# Patient Record
Sex: Male | Born: 1950 | Race: Black or African American | Hispanic: No | Marital: Single | Smoking: Former smoker
Health system: Southern US, Community
[De-identification: ages and names within clinical notes are randomized; demographics above are authoritative.]

## PROBLEM LIST (undated history)

## (undated) DIAGNOSIS — F329 Major depressive disorder, single episode, unspecified: Secondary | ICD-10-CM

## (undated) DIAGNOSIS — I2510 Atherosclerotic heart disease of native coronary artery without angina pectoris: Secondary | ICD-10-CM

## (undated) DIAGNOSIS — I219 Acute myocardial infarction, unspecified: Secondary | ICD-10-CM

## (undated) DIAGNOSIS — M1990 Unspecified osteoarthritis, unspecified site: Secondary | ICD-10-CM

## (undated) DIAGNOSIS — R251 Tremor, unspecified: Secondary | ICD-10-CM

## (undated) DIAGNOSIS — D649 Anemia, unspecified: Secondary | ICD-10-CM

## (undated) DIAGNOSIS — I10 Essential (primary) hypertension: Secondary | ICD-10-CM

## (undated) DIAGNOSIS — I69354 Hemiplegia and 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 side: Secondary | ICD-10-CM

## (undated) DIAGNOSIS — G709 Myoneural disorder, unspecified: Secondary | ICD-10-CM

## (undated) DIAGNOSIS — I200 Unstable angina: Secondary | ICD-10-CM

## (undated) DIAGNOSIS — I639 Cerebral infarction, unspecified: Secondary | ICD-10-CM

## (undated) DIAGNOSIS — C801 Malignant (primary) neoplasm, unspecified: Secondary | ICD-10-CM

## (undated) DIAGNOSIS — K5981 Ogilvie syndrome: Secondary | ICD-10-CM

## (undated) DIAGNOSIS — F32A Depression, unspecified: Secondary | ICD-10-CM

## (undated) DIAGNOSIS — R569 Unspecified convulsions: Secondary | ICD-10-CM

## (undated) DIAGNOSIS — J449 Chronic obstructive pulmonary disease, unspecified: Secondary | ICD-10-CM

## (undated) DIAGNOSIS — M109 Gout, unspecified: Secondary | ICD-10-CM

## (undated) DIAGNOSIS — F1010 Alcohol abuse, uncomplicated: Secondary | ICD-10-CM

## (undated) DIAGNOSIS — F419 Anxiety disorder, unspecified: Secondary | ICD-10-CM

## (undated) HISTORY — PX: COLON SURGERY: SHX602

## (undated) HISTORY — PX: JOINT REPLACEMENT: SHX530

---

## 2003-08-02 ENCOUNTER — Other Ambulatory Visit: Payer: Self-pay

## 2003-08-03 ENCOUNTER — Other Ambulatory Visit: Payer: Self-pay

## 2006-03-08 ENCOUNTER — Other Ambulatory Visit: Payer: Self-pay

## 2006-03-09 ENCOUNTER — Inpatient Hospital Stay: Payer: Self-pay

## 2008-09-27 ENCOUNTER — Inpatient Hospital Stay: Payer: Self-pay | Admitting: Internal Medicine

## 2008-10-12 ENCOUNTER — Emergency Department: Payer: Self-pay | Admitting: Emergency Medicine

## 2008-10-29 ENCOUNTER — Inpatient Hospital Stay: Payer: Self-pay | Admitting: Internal Medicine

## 2010-05-05 ENCOUNTER — Ambulatory Visit: Payer: Self-pay | Admitting: Internal Medicine

## 2010-05-14 ENCOUNTER — Inpatient Hospital Stay: Payer: Self-pay | Admitting: Internal Medicine

## 2010-06-04 ENCOUNTER — Ambulatory Visit: Payer: Self-pay | Admitting: Internal Medicine

## 2010-06-11 ENCOUNTER — Ambulatory Visit: Payer: Self-pay

## 2010-06-12 ENCOUNTER — Inpatient Hospital Stay (HOSPITAL_COMMUNITY): Admission: EM | Admit: 2010-06-12 | Discharge: 2010-06-13 | Payer: Self-pay | Admitting: Emergency Medicine

## 2010-06-22 ENCOUNTER — Ambulatory Visit: Payer: Self-pay | Admitting: Internal Medicine

## 2010-07-05 ENCOUNTER — Ambulatory Visit: Payer: Self-pay | Admitting: Internal Medicine

## 2010-07-25 ENCOUNTER — Ambulatory Visit (HOSPITAL_COMMUNITY)
Admission: RE | Admit: 2010-07-25 | Discharge: 2010-07-25 | Payer: Self-pay | Source: Home / Self Care | Admitting: Internal Medicine

## 2010-09-07 LAB — CBC
HCT: 39.2 % (ref 39.0–52.0)
Hemoglobin: 12.3 g/dL — ABNORMAL LOW (ref 13.0–17.0)
MCH: 28.7 pg (ref 26.0–34.0)
MCHC: 31.4 g/dL (ref 30.0–36.0)
MCV: 91.4 fL (ref 78.0–100.0)
Platelets: 248 10*3/uL (ref 150–400)
RBC: 4.29 MIL/uL (ref 4.22–5.81)
RDW: 14.3 % (ref 11.5–15.5)
WBC: 6.5 10*3/uL (ref 4.0–10.5)

## 2010-09-07 LAB — COMPREHENSIVE METABOLIC PANEL
ALT: 20 U/L (ref 0–53)
AST: 26 U/L (ref 0–37)
Albumin: 3.8 g/dL (ref 3.5–5.2)
Alkaline Phosphatase: 94 U/L (ref 39–117)
BUN: 19 mg/dL (ref 6–23)
CO2: 26 mEq/L (ref 19–32)
Calcium: 9.9 mg/dL (ref 8.4–10.5)
Chloride: 103 mEq/L (ref 96–112)
Creatinine, Ser: 0.84 mg/dL (ref 0.4–1.5)
GFR calc Af Amer: >60 mL/min (ref >60)
GFR calc non Af Amer: >60 mL/min (ref >60)
Glucose, Bld: 84 mg/dL (ref 70–99)
Potassium: 4.8 mEq/L (ref 3.5–5.1)
Sodium: 139 mEq/L (ref 135–145)
Total Bilirubin: 0.4 mg/dL (ref 0.3–1.2)
Total Protein: 8.2 g/dL (ref 6.0–8.3)

## 2010-09-07 LAB — APTT: aPTT: 33 seconds (ref 24–37)

## 2010-09-07 LAB — PROTIME-INR
INR: 1.03 (ref 0.00–1.49)
Prothrombin Time: 13.7 seconds (ref 11.6–15.2)

## 2010-09-08 LAB — DIFFERENTIAL
Basophils Absolute: 0 10*3/uL (ref 0.0–0.1)
Basophils Relative: 0 % (ref 0–1)
Eosinophils Absolute: 0.2 10*3/uL (ref 0.0–0.7)
Eosinophils Relative: 3 % (ref 0–5)
Lymphocytes Relative: 38 % (ref 12–46)
Lymphs Abs: 2.4 10*3/uL (ref 0.7–4.0)
Monocytes Absolute: 0.5 10*3/uL (ref 0.1–1.0)
Monocytes Relative: 8 % (ref 3–12)
Neutro Abs: 3.3 10*3/uL (ref 1.7–7.7)
Neutrophils Relative %: 52 % (ref 43–77)

## 2010-09-12 ENCOUNTER — Inpatient Hospital Stay (HOSPITAL_COMMUNITY)
Admission: RE | Admit: 2010-09-12 | Discharge: 2010-09-21 | Payer: Self-pay | Source: Home / Self Care | Attending: General Surgery | Admitting: General Surgery

## 2010-09-12 ENCOUNTER — Encounter (INDEPENDENT_AMBULATORY_CARE_PROVIDER_SITE_OTHER): Payer: Self-pay | Admitting: General Surgery

## 2010-09-12 HISTORY — PX: LAPAROSCOPIC SIGMOID COLECTOMY: SHX5928

## 2010-09-19 LAB — CBC
HCT: 33.2 % — ABNORMAL LOW (ref 39.0–52.0)
HCT: 33.2 % — ABNORMAL LOW (ref 39.0–52.0)
Hemoglobin: 10.5 g/dL — ABNORMAL LOW (ref 13.0–17.0)
Hemoglobin: 10.8 g/dL — ABNORMAL LOW (ref 13.0–17.0)
MCH: 28.9 pg (ref 26.0–34.0)
MCH: 28.9 pg (ref 26.0–34.0)
MCHC: 31.6 g/dL (ref 30.0–36.0)
MCHC: 32.5 g/dL (ref 30.0–36.0)
MCV: 91.5 fL (ref 78.0–100.0)
MCV: 88.8 fL (ref 78.0–100.0)
Platelets: 234 10*3/uL (ref 150–400)
Platelets: 332 10*3/uL (ref 150–400)
RBC: 3.63 MIL/uL — ABNORMAL LOW (ref 4.22–5.81)
RBC: 3.74 MIL/uL — ABNORMAL LOW (ref 4.22–5.81)
RDW: 13.9 % (ref 11.5–15.5)
RDW: 13.4 % (ref 11.5–15.5)
WBC: 9.3 10*3/uL (ref 4.0–10.5)
WBC: 7.9 10*3/uL (ref 4.0–10.5)

## 2010-09-19 LAB — BASIC METABOLIC PANEL
BUN: 4 mg/dL — ABNORMAL LOW (ref 6–23)
CO2: 27 mEq/L (ref 19–32)
Calcium: 8.9 mg/dL (ref 8.4–10.5)
Chloride: 105 mEq/L (ref 96–112)
Creatinine, Ser: 0.92 mg/dL (ref 0.4–1.5)
GFR calc Af Amer: >60 mL/min (ref >60)
GFR calc non Af Amer: >60 mL/min (ref >60)
Glucose, Bld: 118 mg/dL — ABNORMAL HIGH (ref 70–99)
Potassium: 4.2 mEq/L (ref 3.5–5.1)
Sodium: 140 mEq/L (ref 135–145)

## 2010-09-21 LAB — CBC
HCT: 31.4 % — ABNORMAL LOW (ref 39.0–52.0)
Hemoglobin: 10 g/dL — ABNORMAL LOW (ref 13.0–17.0)
MCH: 28.5 pg (ref 26.0–34.0)
MCHC: 31.8 g/dL (ref 30.0–36.0)
MCV: 89.5 fL (ref 78.0–100.0)
Platelets: 301 10*3/uL (ref 150–400)
RBC: 3.51 MIL/uL — ABNORMAL LOW (ref 4.22–5.81)
RDW: 13.5 % (ref 11.5–15.5)
WBC: 5.5 10*3/uL (ref 4.0–10.5)

## 2010-09-21 LAB — BASIC METABOLIC PANEL
BUN: 1 mg/dL — ABNORMAL LOW (ref 6–23)
CO2: 24 mEq/L (ref 19–32)
Calcium: 8.6 mg/dL (ref 8.4–10.5)
Chloride: 111 mEq/L (ref 96–112)
Creatinine, Ser: 0.8 mg/dL (ref 0.4–1.5)
GFR calc Af Amer: >60 mL/min (ref >60)
GFR calc non Af Amer: >60 mL/min (ref >60)
Glucose, Bld: 98 mg/dL (ref 70–99)
Potassium: 4.3 mEq/L (ref 3.5–5.1)
Sodium: 140 mEq/L (ref 135–145)

## 2010-09-23 NOTE — Discharge Summary (Signed)
  NAME:  Calvin [name], Calvin [name] NO.:  [id number]  MEDICAL RECORD NO.:  [id number]          PATIENT TYPE:  INP  LOCATION:  1533                         FACILITY:  [facility]  PHYSICIAN:  Lennie Muckle, MD      DATE OF BIRTH:  10-15-1950  DATE OF ADMISSION:  09/12/2010 DATE OF DISCHARGE:  09/17/2010                              DISCHARGE SUMMARY   FINAL DIAGNOSES: 1. Adenocarcinoma of the sigmoid. 2. Deconditioning.  PROCEDURE:  Laparoscopic hand-assisted sigmoid resection.  POSTOPERATIVE COURSE:  [name] is a 60 year old male who underwent laparoscopic hand-assisted sigmoid colectomy.  This was done on 09/12/2010.  He had an uneventful postoperative course and had mild complaints of abdominal discomfort around the incision site.  He was able to have a normal return of bowel function.  Serum chemistries and CBC were normal postoperatively.  He had been ambulating but has been deconditioned, was actually at [facility] preoperatively.  He does not really have much motivation to ambulate.  He is still recovering from his previous hospitalization from Clostridium difficile as well as deconditioning.  His wound has been without evidence of infection.  He has had no drainage.  He has had pain adequately controlled with oral narcotics.  He has been advanced to a regular diet.  I expect him not to have much of an appetite but have instructed him to take Boost supplement protein shakes for nutritional needs.  His final condition upon discharge is improved.  He will follow up with me in approximately 2 or 3 weeks.  Call if he develops any fevers, chills or increase in abdominal pain.     Lennie Muckle, MD     ALA/MEDQ  D:  09/16/2010  T:  09/16/2010  Job:  [id number]  Electronically Signed by Bertram Savin MD on 09/23/2010 02:51:29 PM

## 2010-09-23 NOTE — Discharge Summary (Signed)
  NAME:  Calvin [name], CAPRI NO.:  [id number]  MEDICAL RECORD NO.:  [id number]          PATIENT TYPE:  INP  LOCATION:  1533                         FACILITY:  [facility]  PHYSICIAN:  Lennie Muckle, MD      DATE OF BIRTH:  03/07/1951  DATE OF ADMISSION:  09/12/2010 DATE OF DISCHARGE:  09/21/2010                              DISCHARGE SUMMARY   This is an addendum to the previous dictation performed on January 13.  FINAL DIAGNOSIS:  Adenocarcinoma of the sigmoid.  PROCEDURES:  January 9, hand-assisted laparoscopic sigmoid resection. Final pathology was consistent with a adenocarcinoma, 7-cm tumor, negative lymph nodes.  HOSPITAL COURSE:  Mr. Starliper had had an episode with an ileus which prolonged his hospital stay by couple of days.  He began to have increased abdominal distention, nausea.  No emesis.  Radiographic imaging revealed dilated small-bowel with conservative management.  He was able to resolve this and is ambulating without difficulty, has been tolerating a regular diet, is having normal bowel movements.  He continued to have some slight abdominal distention, but is doing remarkably well.  His incision continues to be without evidence of infection.  He will be discharged to [facility] and follow up with me in approximately 2 to 3 weeks.  No heavy lifting over 20 pounds.  FINAL DISCHARGE DIAGNOSIS:  Adenocarcinoma T3, M0.  FINAL DISCHARGE MEDICATIONS:  Include; 1. Vicodin for pain. 2. Stool softener as needed. 3. Continue with Boost 4 times a day, Resource. 4. Ambien p.r.n. 5. Diltiazem 120 mg. 6. Debrox right ear as needed. 7. Iron tablets once daily.  CONDITION UPON DISCHARGE:  Improved.     Lennie Muckle, MD     Calvin [name]/MEDQ  D:  09/21/2010  T:  09/21/2010  Job:  [id number]  Electronically Signed by Bertram Savin MD on 09/23/2010 02:51:33 PM

## 2010-09-23 NOTE — Op Note (Signed)
NAME:  CASON, LUFFMAN NO.:  [id number]  MEDICAL RECORD NO.:  [id number]          PATIENT TYPE:  INP  LOCATION:  1533                         FACILITY:  [facility]  PHYSICIAN:  Lennie Muckle, MD      DATE OF BIRTH:  [birth year]  DATE OF PROCEDURE:  09/12/2010 DATE OF DISCHARGE:                              OPERATIVE REPORT   PREOPERATIVE DIAGNOSIS:  Large dysplastic polyp, sigmoid colon.  POSTOPERATIVE DIAGNOSIS:  Large dysplastic polyp, sigmoid colon.  PROCEDURE:  Laparoscopic hand-assisted sigmoid colectomy with mobilization of the splenic flexure and rigid proctoscopy.  SURGEON:  Dafney Farler L. Freida Busman, MD  ASSISTANT:  Anselm Pancoast. Zachery Dakins, MD  FINDINGS:  Large polyp within the sigmoid, somewhat palpable lymph nodes along the inferior mesenteric vein and artery.  SPECIMENS:  Sigmoid.  ESTIMATED BLOOD LOSS:  100 mL.  COMPLICATIONS:  No immediate complications.  INDICATIONS FOR PROCEDURE:  Mr. Marsico is a 60 year old male who was found to have heme-positive stools.  He underwent colonoscopy and was found to have several polyps within the colon.  There was a 6-cm polyp noted partially obstructing in the sigmoid.  Biopsy revealed dysplasia. Due to the large size and inability to fully resect this endoscopically, we talked to the patient about performing colon resection.  I felt he would be a good candidate for laparoscopic hand-assisted surgery. Informed consent was obtained.  He received a bowel prep and antibiotics preoperatively.  DETAILS OF PROCEDURE:  Mr. Vanderloop was identified in the preoperative holding area and was seen by Anesthesia.  All questions were answered and received his antibiotics IV preoperatively.  He was then taken to the operating suite, placed in supine position.  After administration of general endotracheal anesthesia, a Foley catheter was placed. Sequential compression devices were applied to his lower extremities. He was placed in  lithotomy position.  I did digital examination, irrigated the anus and rectum using a small amount of Betadine and saline.  After irrigating this area, his perineum and abdomen were prepped and draped in usual sterile fashion.  Surgical time-out performed.  I began by placing the Veress needle in left upper quadrant. Fluid easily aspirated, went to abdominal cavity.  I then insufflated the abdomen.  I placed a 5-mm trocar just above the umbilicus using the Optiview.  All layers of abdominal wall were visualized upon entry.  I inspected the abdomen, found no evidence of injury upon placement of the trocar.  The Veress needle was identified in the left upper quadrant.  I found no evidence upon placement of the Veress.  I then placed a trocar just beneath the umbilical region under laparoscopic guidance.  I placed an additional port on the left side of the abdomen.  At that time, I lengthened the incision beneath the umbilical region and placed the hand port in.  I incised the fascia with electrocautery.  After placing the hand GelPort in place, I was able to mobilize along the line of Toldt the sigmoid.  I went inferiorly down into the pelvis, was able to palpate the lesion, I saw the tattoo without difficulty.  I scored the  mesentery on either side of the sigmoid colon.  I identified the ureter on the left side of the patient, stayed high in the vicinity of the mesentery scoring this on the right as well.  I got well beneath the tumor.  I then continued mobilizing the colon along the line of Toldt superiorly up to the splenic flexure.  I then chose to dissect along the inferior mesenteric vein and artery.  I was able to clear off the surrounding mesenteric tissues, placed and upsized one of the laparoscopic ports superiorly to a 12 size port and also placed the laparoscopic stapler through this.  I stapled just at the takeoff of the IMA.  Using a laparoscopic stapler, this was easily done.   No bleeding along the staple line.  I then continued dissection with the Harmonic scalpel of the mesentery.  I pulled the specimen about to the operative field.  I was able to place a TA60 distally on the colon, placed a bowel clamp proximally and transected this with the #10 blade.  I then chose an area of good distance proximally where there was good blood flow and it was well enough distance from the tumor.  I felt I was approximately 5 cm inferiorly from the tumor and a good 10 cm superiorly.  I then placed a 75 GIA stapler proximally.  The specimen was passed off the operative field.  I placed a stitch on the distal portion.  I then irrigated the pelvis.  I felt I did not have enough distance to possibly do a stapled anastomosis and I felt it was likely too long to come from below.  I therefore reinsufflated the abdomen, placed the hand port back in place, mobilized the splenic flexure using the Harmonic scalpel. Once I mobilized this, I was able to further inspect down the pelvis. At that time, Dr. Zachery Dakins joined me in the operating room.  He performed a rigid proctoscopy, sounded distally and proximally 20 cm. The proctoscope went easily up into the sigmoid.  We felt we were able to get an EEA stapler up through this stump.  I then used a sizer to chose a 29 EEA stapler, secured the anvil with a 2-0 Prolene suture. Dr. Zachery Dakins then went below the patient.  We performed a stapled anastomosis posterior to the staple line.  This went without difficulty. The stapler deployed without problems.  The 2 suture rings were intact. I reinforced the staple line using 3-0 silk sutures.  I also placed a layer of Tisseel in the vicinity.  There was no evidence of bleeding within the vicinity.  I irrigated the pelvis and found no drainage and rigid proctoscopy performed after the stapling revealed no bubbling. The staple line appeared intact.  I then after placing a layer of Tisseel closed the  fascial defect at the hand port using a #1 PDS suture in a running fashion.  I then reinsufflated the abdomen, found the fascial closure to be adequate, found no bleeding and no evidence of bowel injury.  I then closed the fascial defect at the 12-mm trocar site using a 0 Vicryl on a suture passer.  Once this was closed, I did one final inspection of the abdomen and found no evidence of bleeding, staple line intact, and no injury.  I then released the pneumoperitoneum and removed the trocars.  Skin was closed with 3-0 Vicryl at all sites. I placed small wicks of Telfa dressing at the larger incision, dry gauze, Tegaderm, and  I placed Steris at the other incision sites.  The patient was then extubated and transferred to postanesthesia care unit in stable condition.  He will be monitored until he has return of bowel function.     Lennie Muckle, MD     ALA/MEDQ  D:  09/12/2010  T:  09/12/2010  Job:  [id number]  cc:   Graylin Shiver, M.D. Fax: [phone]  Karlene Einstein, M.D. Fax: [phone]  Electronically Signed by Bertram Savin MD on 09/23/2010 02:50:59 PM

## 2010-09-29 LAB — SURGICAL PCR SCREEN
MRSA, PCR: NEGATIVE
Staphylococcus aureus: NEGATIVE

## 2010-09-29 LAB — CEA: CEA: 5.1 ng/mL — ABNORMAL HIGH (ref 0.0–5.0)

## 2010-11-02 ENCOUNTER — Encounter: Payer: Self-pay | Admitting: Hematology and Oncology

## 2010-11-17 LAB — CBC
HCT: 34.1 % — ABNORMAL LOW (ref 39.0–52.0)
HCT: 34.2 % — ABNORMAL LOW (ref 39.0–52.0)
HCT: 34.7 % — ABNORMAL LOW (ref 39.0–52.0)
HCT: 38.8 % — ABNORMAL LOW (ref 39.0–52.0)
Hemoglobin: 11 g/dL — ABNORMAL LOW (ref 13.0–17.0)
Hemoglobin: 11 g/dL — ABNORMAL LOW (ref 13.0–17.0)
Hemoglobin: 11.7 g/dL — ABNORMAL LOW (ref 13.0–17.0)
Hemoglobin: 11.8 g/dL — ABNORMAL LOW (ref 13.0–17.0)
MCH: 29.6 pg (ref 26.0–34.0)
MCH: 30.3 pg (ref 26.0–34.0)
MCH: 31 pg (ref 26.0–34.0)
MCH: 31.4 pg (ref 26.0–34.0)
MCHC: 32.2 g/dL (ref 30.0–36.0)
MCHC: 33.7 g/dL (ref 30.0–36.0)
MCV: 92.4 fL (ref 78.0–100.0)
MCV: 93 fL (ref 78.0–100.0)
MCV: 93.9 fL (ref 78.0–100.0)
Platelets: 279 10*3/uL (ref 150–400)
Platelets: 291 10*3/uL (ref 150–400)
Platelets: 312 10*3/uL (ref 150–400)
RBC: 3.69 MIL/uL — ABNORMAL LOW (ref 4.22–5.81)
RBC: 3.71 MIL/uL — ABNORMAL LOW (ref 4.22–5.81)
RBC: 3.73 MIL/uL — ABNORMAL LOW (ref 4.22–5.81)
RBC: 3.89 MIL/uL — ABNORMAL LOW (ref 4.22–5.81)
RDW: 16.3 % — ABNORMAL HIGH (ref 11.5–15.5)
RDW: 16.5 % — ABNORMAL HIGH (ref 11.5–15.5)
WBC: 6.3 10*3/uL (ref 4.0–10.5)
WBC: 7.4 10*3/uL (ref 4.0–10.5)
WBC: 8.2 10*3/uL (ref 4.0–10.5)

## 2010-11-17 LAB — COMPREHENSIVE METABOLIC PANEL
ALT: 12 U/L (ref 0–53)
AST: 25 U/L (ref 0–37)
Albumin: 2.2 g/dL — ABNORMAL LOW (ref 3.5–5.2)
Albumin: 2.3 g/dL — ABNORMAL LOW (ref 3.5–5.2)
Alkaline Phosphatase: 77 U/L (ref 39–117)
Alkaline Phosphatase: 89 U/L (ref 39–117)
BUN: 2 mg/dL — ABNORMAL LOW (ref 6–23)
BUN: 5 mg/dL — ABNORMAL LOW (ref 6–23)
CO2: 24 mEq/L (ref 19–32)
Calcium: 8.6 mg/dL (ref 8.4–10.5)
Chloride: 109 mEq/L (ref 96–112)
Chloride: 110 mEq/L (ref 96–112)
Creatinine, Ser: 0.73 mg/dL (ref 0.4–1.5)
Creatinine, Ser: 0.85 mg/dL (ref 0.4–1.5)
GFR calc Af Amer: >60 mL/min (ref >60)
GFR calc non Af Amer: >60 mL/min (ref >60)
Glucose, Bld: 73 mg/dL (ref 70–99)
Glucose, Bld: 82 mg/dL (ref 70–99)
Potassium: 3.9 mEq/L (ref 3.5–5.1)
Sodium: 138 mEq/L (ref 135–145)
Total Bilirubin: 0.7 mg/dL (ref 0.3–1.2)
Total Bilirubin: 0.8 mg/dL (ref 0.3–1.2)
Total Protein: 6.3 g/dL (ref 6.0–8.3)
Total Protein: 6.4 g/dL (ref 6.0–8.3)

## 2010-11-17 LAB — DIFFERENTIAL
Basophils Absolute: 0.1 10*3/uL (ref 0.0–0.1)
Basophils Relative: 1 % (ref 0–1)
Eosinophils Absolute: 0.4 10*3/uL (ref 0.0–0.7)
Eosinophils Relative: 5 % (ref 0–5)
Lymphocytes Relative: 38 % (ref 12–46)
Lymphs Abs: 3.1 10*3/uL (ref 0.7–4.0)
Monocytes Absolute: 0.8 10*3/uL (ref 0.1–1.0)
Monocytes Relative: 9 % (ref 3–12)
Neutro Abs: 3.9 10*3/uL (ref 1.7–7.7)
Neutrophils Relative %: 47 % (ref 43–77)

## 2010-11-17 LAB — URINALYSIS, ROUTINE W REFLEX MICROSCOPIC
Bilirubin Urine: NEGATIVE
Glucose, UA: NEGATIVE mg/dL
Hgb urine dipstick: NEGATIVE
Ketones, ur: NEGATIVE mg/dL
Nitrite: NEGATIVE
Protein, ur: NEGATIVE mg/dL
Specific Gravity, Urine: 1.016 (ref 1.005–1.030)
Urobilinogen, UA: 0.2 mg/dL (ref 0.0–1.0)
pH: 6 (ref 5.0–8.0)

## 2010-11-17 LAB — ABO/RH: ABO/RH(D): B POS

## 2010-11-17 LAB — RETICULOCYTES
RBC.: 3.75 MIL/uL — ABNORMAL LOW (ref 4.22–5.81)
Retic Count, Absolute: 26.3 10*3/uL (ref 19.0–186.0)
Retic Ct Pct: 0.7 % (ref 0.4–3.1)

## 2010-11-17 LAB — CLOSTRIDIUM DIFFICILE BY PCR: Toxigenic C. Difficile by PCR: NOT DETECTED

## 2010-11-17 LAB — URINE MICROSCOPIC-ADD ON

## 2010-11-17 LAB — IRON AND TIBC
Iron: 52 ug/dL (ref 42–135)
Saturation Ratios: 46 % (ref 20–55)
TIBC: 114 ug/dL — ABNORMAL LOW (ref 215–435)
UIBC: 62 ug/dL

## 2010-11-17 LAB — BASIC METABOLIC PANEL
CO2: 20 mEq/L (ref 19–32)
Calcium: 8.4 mg/dL (ref 8.4–10.5)
Creatinine, Ser: 0.74 mg/dL (ref 0.4–1.5)
GFR calc Af Amer: >60 mL/min (ref >60)
GFR calc non Af Amer: >60 mL/min (ref >60)
Sodium: 140 mEq/L (ref 135–145)

## 2010-11-17 LAB — FOLATE: Folate: >20 ng/mL

## 2010-11-17 LAB — MAGNESIUM: Magnesium: 1.6 mg/dL (ref 1.5–2.5)

## 2011-05-27 ENCOUNTER — Emergency Department: Payer: Self-pay | Admitting: Emergency Medicine

## 2011-08-11 ENCOUNTER — Ambulatory Visit: Payer: Self-pay | Admitting: Internal Medicine

## 2011-09-05 HISTORY — PX: THROAT SURGERY: SHX803

## 2011-09-20 ENCOUNTER — Ambulatory Visit: Payer: Self-pay | Admitting: Internal Medicine

## 2011-09-20 LAB — APTT: Activated PTT: 23.6 secs (ref 23.6–35.9)

## 2011-09-20 LAB — CBC CANCER CENTER
Basophil #: 0.1 x10 3/mm (ref 0.0–0.1)
Eosinophil #: 0 x10 3/mm (ref 0.0–0.7)
HCT: 44.1 % (ref 40.0–52.0)
Lymphocyte #: 2.6 x10 3/mm (ref 1.0–3.6)
MCHC: 33.5 g/dL (ref 32.0–36.0)
MCV: 106 fL — ABNORMAL HIGH (ref 80–100)
Neutrophil #: 3.3 x10 3/mm (ref 1.4–6.5)
Platelet: 203 x10 3/mm (ref 150–440)
RDW: 20.5 % — ABNORMAL HIGH (ref 11.5–14.5)
WBC: 6.5 x10 3/mm (ref 3.8–10.6)

## 2011-09-20 LAB — COMPREHENSIVE METABOLIC PANEL
Albumin: 3.2 g/dL — ABNORMAL LOW (ref 3.4–5.0)
Alkaline Phosphatase: 153 U/L — ABNORMAL HIGH (ref 50–136)
BUN: 3 mg/dL — ABNORMAL LOW (ref 7–18)
Bilirubin,Total: 0.8 mg/dL (ref 0.2–1.0)
Co2: 26 mmol/L (ref 21–32)
Creatinine: 0.85 mg/dL (ref 0.60–1.30)
Osmolality: 276 (ref 275–301)
SGPT (ALT): 14 U/L
Sodium: 140 mmol/L (ref 136–145)
Total Protein: 8.3 g/dL — ABNORMAL HIGH (ref 6.4–8.2)

## 2011-10-06 ENCOUNTER — Ambulatory Visit: Payer: Self-pay | Admitting: Internal Medicine

## 2011-10-31 ENCOUNTER — Inpatient Hospital Stay: Payer: Self-pay | Admitting: Internal Medicine

## 2011-10-31 LAB — COMPREHENSIVE METABOLIC PANEL
Albumin: 2.8 g/dL — ABNORMAL LOW (ref 3.4–5.0)
Alkaline Phosphatase: 181 U/L — ABNORMAL HIGH (ref 50–136)
Bilirubin,Total: 1.2 mg/dL — ABNORMAL HIGH (ref 0.2–1.0)
Co2: 28 mmol/L (ref 21–32)
Creatinine: 0.95 mg/dL (ref 0.60–1.30)
Glucose: 106 mg/dL — ABNORMAL HIGH (ref 65–99)
Osmolality: 258 (ref 275–301)
Sodium: 129 mmol/L — ABNORMAL LOW (ref 136–145)

## 2011-10-31 LAB — PROTIME-INR
INR: 0.9
Prothrombin Time: 12.7 secs (ref 11.5–14.7)

## 2011-10-31 LAB — APTT: Activated PTT: 28.6 secs (ref 23.6–35.9)

## 2011-10-31 LAB — CBC
HCT: 44.1 % (ref 40.0–52.0)
HGB: 14.9 g/dL (ref 13.0–18.0)
MCV: 109 fL — ABNORMAL HIGH (ref 80–100)
RBC: 4.04 10*6/uL — ABNORMAL LOW (ref 4.40–5.90)
WBC: 9.7 10*3/uL (ref 3.8–10.6)

## 2011-10-31 LAB — URINALYSIS, COMPLETE
Bacteria: NONE SEEN
Blood: NEGATIVE
Hyaline Cast: 3
Nitrite: NEGATIVE
Ph: 7 (ref 4.5–8.0)
Squamous Epithelial: 1

## 2011-11-01 LAB — COMPREHENSIVE METABOLIC PANEL
Anion Gap: 9 (ref 7–16)
Bilirubin,Total: 1.2 mg/dL — ABNORMAL HIGH (ref 0.2–1.0)
Chloride: 93 mmol/L — ABNORMAL LOW (ref 98–107)
Co2: 31 mmol/L (ref 21–32)
Creatinine: 0.85 mg/dL (ref 0.60–1.30)
EGFR (African American): >60
Glucose: 84 mg/dL (ref 65–99)
Osmolality: 264 (ref 275–301)
Potassium: 3.9 mmol/L (ref 3.5–5.1)
SGPT (ALT): 28 U/L

## 2011-11-01 LAB — CBC WITH DIFFERENTIAL/PLATELET
Basophil #: 0 10*3/uL (ref 0.0–0.1)
Eosinophil #: 0 10*3/uL (ref 0.0–0.7)
Lymphocyte #: 1.7 10*3/uL (ref 1.0–3.6)
Lymphocyte %: 24.5 %
MCH: 37.1 pg — ABNORMAL HIGH (ref 26.0–34.0)
MCHC: 33.8 g/dL (ref 32.0–36.0)
Neutrophil #: 4.8 10*3/uL (ref 1.4–6.5)
Platelet: 97 10*3/uL — ABNORMAL LOW (ref 150–440)
RBC: 3.23 10*6/uL — ABNORMAL LOW (ref 4.40–5.90)
RDW: 21.8 % — ABNORMAL HIGH (ref 11.5–14.5)

## 2011-11-01 LAB — TROPONIN I: Troponin-I: <0.02 ng/mL

## 2011-11-02 LAB — URINE CULTURE

## 2011-11-03 ENCOUNTER — Ambulatory Visit: Payer: Self-pay | Admitting: Internal Medicine

## 2011-11-03 LAB — HEMOGLOBIN
HGB: 7.7 g/dL — ABNORMAL LOW (ref 13.0–18.0)
HGB: 9.8 g/dL — ABNORMAL LOW (ref 13.0–18.0)

## 2011-11-03 LAB — PATHOLOGY REPORT

## 2011-11-05 LAB — URINALYSIS, COMPLETE
Bacteria: NONE SEEN
Bilirubin,UR: NEGATIVE
Blood: NEGATIVE
Glucose,UR: NEGATIVE mg/dL (ref 0–75)
Ketone: NEGATIVE
Nitrite: NEGATIVE
RBC,UR: NONE SEEN /HPF (ref 0–5)
Specific Gravity: 1.01 (ref 1.003–1.030)
Squamous Epithelial: NONE SEEN
WBC UR: 1 /HPF (ref 0–5)

## 2011-11-06 LAB — CBC WITH DIFFERENTIAL/PLATELET
Basophil #: 0 10*3/uL (ref 0.0–0.1)
Eosinophil %: 0.5 %
HCT: 31.9 % — ABNORMAL LOW (ref 40.0–52.0)
HGB: 10.6 g/dL — ABNORMAL LOW (ref 13.0–18.0)
Lymphocyte #: 2.9 10*3/uL (ref 1.0–3.6)
Lymphocyte %: 24.5 %
MCH: 34 pg (ref 26.0–34.0)
Monocyte #: 1.5 10*3/uL — ABNORMAL HIGH (ref 0.0–0.7)
Monocyte %: 12.8 %
WBC: 11.9 10*3/uL — ABNORMAL HIGH (ref 3.8–10.6)

## 2011-11-06 LAB — BASIC METABOLIC PANEL
BUN: 9 mg/dL (ref 7–18)
Chloride: 100 mmol/L (ref 98–107)
Co2: 25 mmol/L (ref 21–32)
Creatinine: 0.84 mg/dL (ref 0.60–1.30)
EGFR (Non-African Amer.): >60
Glucose: 82 mg/dL (ref 65–99)
Potassium: 4.7 mmol/L (ref 3.5–5.1)
Sodium: 136 mmol/L (ref 136–145)

## 2011-11-08 LAB — CBC WITH DIFFERENTIAL/PLATELET
Eosinophil %: 1 %
MCH: 34.4 pg — ABNORMAL HIGH (ref 26.0–34.0)
Monocyte #: 1.4 10*3/uL — ABNORMAL HIGH (ref 0.0–0.7)
Monocyte %: 14.4 %
Neutrophil %: 60.5 %
Platelet: 318 10*3/uL (ref 150–440)
RBC: 2.77 10*6/uL — ABNORMAL LOW (ref 4.40–5.90)
WBC: 9.9 10*3/uL (ref 3.8–10.6)

## 2011-11-08 LAB — BASIC METABOLIC PANEL
Anion Gap: 12 (ref 7–16)
Calcium, Total: 8.1 mg/dL — ABNORMAL LOW (ref 8.5–10.1)
Co2: 22 mmol/L (ref 21–32)
EGFR (Non-African Amer.): >60
Potassium: 4.1 mmol/L (ref 3.5–5.1)
Sodium: 141 mmol/L (ref 136–145)

## 2011-11-12 LAB — CULTURE, BLOOD (SINGLE)

## 2011-11-13 ENCOUNTER — Ambulatory Visit: Payer: Self-pay | Admitting: Internal Medicine

## 2011-12-04 ENCOUNTER — Ambulatory Visit: Payer: Self-pay | Admitting: Internal Medicine

## 2011-12-04 LAB — COMPREHENSIVE METABOLIC PANEL
Alkaline Phosphatase: 123 U/L (ref 50–136)
Anion Gap: 10 (ref 7–16)
BUN: 7 mg/dL (ref 7–18)
Bilirubin,Total: 0.3 mg/dL (ref 0.2–1.0)
Calcium, Total: 9.1 mg/dL (ref 8.5–10.1)
Chloride: 99 mmol/L (ref 98–107)
Co2: 26 mmol/L (ref 21–32)
Creatinine: 0.93 mg/dL (ref 0.60–1.30)
EGFR (African American): >60
EGFR (Non-African Amer.): >60
Glucose: 101 mg/dL — ABNORMAL HIGH (ref 65–99)
Osmolality: 268 (ref 275–301)
SGOT(AST): 21 U/L (ref 15–37)
SGPT (ALT): 19 U/L
Sodium: 135 mmol/L — ABNORMAL LOW (ref 136–145)
Total Protein: 8.1 g/dL (ref 6.4–8.2)

## 2011-12-04 LAB — CBC CANCER CENTER
Basophil #: 0.1 x10 3/mm (ref 0.0–0.1)
Basophil %: 1.3 %
Eosinophil %: 0.4 %
HCT: 34.1 % — ABNORMAL LOW (ref 40.0–52.0)
HGB: 11.5 g/dL — ABNORMAL LOW (ref 13.0–18.0)
Lymphocyte %: 21 %
MCHC: 33.6 g/dL (ref 32.0–36.0)
MCV: 100 fL (ref 80–100)
Monocyte #: 0.7 x10 3/mm (ref 0.0–0.7)
Monocyte %: 7.4 %
Neutrophil #: 6.6 x10 3/mm — ABNORMAL HIGH (ref 1.4–6.5)
RBC: 3.41 10*6/uL — ABNORMAL LOW (ref 4.40–5.90)
WBC: 9.4 x10 3/mm (ref 3.8–10.6)

## 2011-12-11 LAB — CBC CANCER CENTER
Basophil #: 0.1 x10 3/mm (ref 0.0–0.1)
Basophil %: 0.8 %
Eosinophil #: 0 x10 3/mm (ref 0.0–0.7)
Eosinophil %: 0.4 %
HGB: 11.5 g/dL — ABNORMAL LOW (ref 13.0–18.0)
Lymphocyte #: 2.2 x10 3/mm (ref 1.0–3.6)
MCH: 33 pg (ref 26.0–34.0)
MCV: 99 fL (ref 80–100)
Monocyte %: 7.4 %
Neutrophil #: 4.6 x10 3/mm (ref 1.4–6.5)
Platelet: 401 x10 3/mm (ref 150–440)
WBC: 7.5 x10 3/mm (ref 3.8–10.6)

## 2011-12-11 LAB — BASIC METABOLIC PANEL
BUN: 8 mg/dL (ref 7–18)
Chloride: 106 mmol/L (ref 98–107)
Co2: 24 mmol/L (ref 21–32)
Creatinine: 0.78 mg/dL (ref 0.60–1.30)
EGFR (Non-African Amer.): >60
Potassium: 4.4 mmol/L (ref 3.5–5.1)
Sodium: 140 mmol/L (ref 136–145)

## 2011-12-20 LAB — BASIC METABOLIC PANEL
BUN: 9 mg/dL (ref 7–18)
Calcium, Total: 9.3 mg/dL (ref 8.5–10.1)
Co2: 27 mmol/L (ref 21–32)
Creatinine: 0.84 mg/dL (ref 0.60–1.30)

## 2011-12-20 LAB — CBC CANCER CENTER
Basophil #: 0.1 x10 3/mm (ref 0.0–0.1)
Basophil %: 1.1 %
Eosinophil #: 0 x10 3/mm (ref 0.0–0.7)
Eosinophil %: 0.5 %
HCT: 34.7 % — ABNORMAL LOW (ref 40.0–52.0)
HGB: 11.2 g/dL — ABNORMAL LOW (ref 13.0–18.0)
MCHC: 32.4 g/dL (ref 32.0–36.0)
Monocyte #: 0.6 x10 3/mm (ref 0.2–1.0)
Neutrophil #: 5.7 x10 3/mm (ref 1.4–6.5)
Neutrophil %: 72.7 %
Platelet: 346 x10 3/mm (ref 150–440)
RBC: 3.46 10*6/uL — ABNORMAL LOW (ref 4.40–5.90)
WBC: 7.9 x10 3/mm (ref 3.8–10.6)

## 2011-12-20 LAB — MAGNESIUM: Magnesium: 1.7 mg/dL — ABNORMAL LOW

## 2011-12-27 LAB — BASIC METABOLIC PANEL
Anion Gap: 10 (ref 7–16)
BUN: 11 mg/dL (ref 7–18)
Calcium, Total: 9 mg/dL (ref 8.5–10.1)
Co2: 27 mmol/L (ref 21–32)
Creatinine: 0.84 mg/dL (ref 0.60–1.30)
EGFR (Non-African Amer.): >60
Glucose: 92 mg/dL (ref 65–99)
Osmolality: 271 (ref 275–301)
Potassium: 3.5 mmol/L (ref 3.5–5.1)
Sodium: 136 mmol/L (ref 136–145)

## 2011-12-27 LAB — CBC CANCER CENTER
Basophil #: 0.1 x10 3/mm (ref 0.0–0.1)
HCT: 33.3 % — ABNORMAL LOW (ref 40.0–52.0)
HGB: 10.8 g/dL — ABNORMAL LOW (ref 13.0–18.0)
Lymphocyte #: 1.2 x10 3/mm (ref 1.0–3.6)
MCHC: 32.4 g/dL (ref 32.0–36.0)
Monocyte #: 0.5 x10 3/mm (ref 0.2–1.0)
Monocyte %: 6.6 %
Neutrophil #: 6.3 x10 3/mm (ref 1.4–6.5)
Platelet: 284 x10 3/mm (ref 150–440)
RDW: 18 % — ABNORMAL HIGH (ref 11.5–14.5)
WBC: 8.2 x10 3/mm (ref 3.8–10.6)

## 2012-01-03 ENCOUNTER — Ambulatory Visit: Payer: Self-pay | Admitting: Internal Medicine

## 2012-01-03 LAB — CBC CANCER CENTER
Basophil #: 0.1 x10 3/mm (ref 0.0–0.1)
Eosinophil #: 0 x10 3/mm (ref 0.0–0.7)
HCT: 36.7 % — ABNORMAL LOW (ref 40.0–52.0)
HGB: 11.8 g/dL — ABNORMAL LOW (ref 13.0–18.0)
Lymphocyte #: 1 x10 3/mm (ref 1.0–3.6)
Lymphocyte %: 19.8 %
MCHC: 32 g/dL (ref 32.0–36.0)
MCV: 100 fL (ref 80–100)
Monocyte #: 0.4 x10 3/mm (ref 0.2–1.0)
Neutrophil #: 3.7 x10 3/mm (ref 1.4–6.5)
Neutrophil %: 71 %
RDW: 18.3 % — ABNORMAL HIGH (ref 11.5–14.5)

## 2012-01-03 LAB — HEPATIC FUNCTION PANEL A (ARMC)
Albumin: 3.5 g/dL (ref 3.4–5.0)
SGPT (ALT): 23 U/L

## 2012-01-03 LAB — BASIC METABOLIC PANEL
BUN: 9 mg/dL (ref 7–18)
Co2: 25 mmol/L (ref 21–32)
Creatinine: 0.88 mg/dL (ref 0.60–1.30)
Glucose: 83 mg/dL (ref 65–99)
Osmolality: 279 (ref 275–301)
Potassium: 3.8 mmol/L (ref 3.5–5.1)
Sodium: 141 mmol/L (ref 136–145)

## 2012-01-05 ENCOUNTER — Ambulatory Visit: Payer: Self-pay | Admitting: Internal Medicine

## 2012-01-10 LAB — BASIC METABOLIC PANEL
Anion Gap: 10 (ref 7–16)
Calcium, Total: 8.8 mg/dL (ref 8.5–10.1)
Chloride: 106 mmol/L (ref 98–107)
EGFR (African American): >60
EGFR (Non-African Amer.): >60
Glucose: 104 mg/dL — ABNORMAL HIGH (ref 65–99)
Osmolality: 282 (ref 275–301)
Sodium: 142 mmol/L (ref 136–145)

## 2012-01-10 LAB — CBC CANCER CENTER
Basophil #: 0.1 x10 3/mm (ref 0.0–0.1)
Basophil %: 0.9 %
Eosinophil %: 0.2 %
HCT: 33.5 % — ABNORMAL LOW (ref 40.0–52.0)
HGB: 10.8 g/dL — ABNORMAL LOW (ref 13.0–18.0)
Lymphocyte #: 0.8 x10 3/mm — ABNORMAL LOW (ref 1.0–3.6)
Lymphocyte %: 11.8 %
MCHC: 32.4 g/dL (ref 32.0–36.0)
MCV: 100 fL (ref 80–100)
Monocyte #: 0.5 x10 3/mm (ref 0.2–1.0)
Platelet: 209 x10 3/mm (ref 150–440)
RBC: 3.36 10*6/uL — ABNORMAL LOW (ref 4.40–5.90)
WBC: 6.7 x10 3/mm (ref 3.8–10.6)

## 2012-01-17 LAB — CBC CANCER CENTER
Basophil #: 0 x10 3/mm (ref 0.0–0.1)
Basophil %: 0.7 %
Eosinophil #: 0 x10 3/mm (ref 0.0–0.7)
Eosinophil %: 0.5 %
HCT: 34.4 % — ABNORMAL LOW (ref 40.0–52.0)
HGB: 11.1 g/dL — ABNORMAL LOW (ref 13.0–18.0)
Lymphocyte %: 16 %
MCHC: 32.2 g/dL (ref 32.0–36.0)
Monocyte #: 0.7 x10 3/mm (ref 0.2–1.0)
Monocyte %: 11.4 %
Neutrophil #: 4.1 x10 3/mm (ref 1.4–6.5)
Neutrophil %: 71.4 %
RBC: 3.42 10*6/uL — ABNORMAL LOW (ref 4.40–5.90)
RDW: 18.7 % — ABNORMAL HIGH (ref 11.5–14.5)

## 2012-01-17 LAB — HEPATIC FUNCTION PANEL A (ARMC)
Albumin: 3.2 g/dL — ABNORMAL LOW (ref 3.4–5.0)
Alkaline Phosphatase: 89 U/L (ref 50–136)
Bilirubin,Total: 0.4 mg/dL (ref 0.2–1.0)
SGOT(AST): 17 U/L (ref 15–37)

## 2012-01-17 LAB — BASIC METABOLIC PANEL
Creatinine: 0.91 mg/dL (ref 0.60–1.30)
EGFR (Non-African Amer.): >60
Glucose: 103 mg/dL — ABNORMAL HIGH (ref 65–99)
Osmolality: 272 (ref 275–301)

## 2012-01-17 LAB — MAGNESIUM: Magnesium: 1.6 mg/dL — ABNORMAL LOW

## 2012-01-24 LAB — HEPATIC FUNCTION PANEL A (ARMC)
Alkaline Phosphatase: 84 U/L (ref 50–136)
Bilirubin,Total: 0.3 mg/dL (ref 0.2–1.0)
SGOT(AST): 14 U/L — ABNORMAL LOW (ref 15–37)
Total Protein: 7.1 g/dL (ref 6.4–8.2)

## 2012-01-24 LAB — BASIC METABOLIC PANEL
Calcium, Total: 8.7 mg/dL (ref 8.5–10.1)
Chloride: 104 mmol/L (ref 98–107)
Creatinine: 0.85 mg/dL (ref 0.60–1.30)
EGFR (African American): >60
EGFR (Non-African Amer.): >60
Osmolality: 278 (ref 275–301)

## 2012-01-24 LAB — CBC CANCER CENTER
Basophil #: 0 x10 3/mm (ref 0.0–0.1)
Eosinophil #: 0 x10 3/mm (ref 0.0–0.7)
Eosinophil %: 0.4 %
HCT: 38.4 % — ABNORMAL LOW (ref 40.0–52.0)
HGB: 12.6 g/dL — ABNORMAL LOW (ref 13.0–18.0)
Lymphocyte %: 12.9 %
MCH: 33 pg (ref 26.0–34.0)
MCHC: 32.8 g/dL (ref 32.0–36.0)
MCV: 101 fL — ABNORMAL HIGH (ref 80–100)
Monocyte #: 0.4 x10 3/mm (ref 0.2–1.0)
Neutrophil %: 78.7 %
Platelet: 176 x10 3/mm (ref 150–440)
RDW: 18.7 % — ABNORMAL HIGH (ref 11.5–14.5)

## 2012-01-31 LAB — CBC CANCER CENTER
Basophil %: 0.7 %
Eosinophil #: 0 x10 3/mm (ref 0.0–0.7)
HCT: 35 % — ABNORMAL LOW (ref 40.0–52.0)
HGB: 11.4 g/dL — ABNORMAL LOW (ref 13.0–18.0)
Lymphocyte %: 20.7 %
MCH: 32.3 pg (ref 26.0–34.0)
MCHC: 32.4 g/dL (ref 32.0–36.0)
MCV: 99 fL (ref 80–100)
Monocyte %: 8.7 %
WBC: 2.8 x10 3/mm — ABNORMAL LOW (ref 3.8–10.6)

## 2012-01-31 LAB — HEPATIC FUNCTION PANEL A (ARMC)
Alkaline Phosphatase: 85 U/L (ref 50–136)
Bilirubin,Total: 0.2 mg/dL (ref 0.2–1.0)
SGOT(AST): 14 U/L — ABNORMAL LOW (ref 15–37)
SGPT (ALT): 20 U/L

## 2012-01-31 LAB — BASIC METABOLIC PANEL
Anion Gap: 11 (ref 7–16)
Calcium, Total: 9.1 mg/dL (ref 8.5–10.1)
Co2: 26 mmol/L (ref 21–32)
EGFR (African American): >60
EGFR (Non-African Amer.): >60
Glucose: 59 mg/dL — ABNORMAL LOW (ref 65–99)

## 2012-01-31 LAB — MAGNESIUM: Magnesium: 1.9 mg/dL

## 2012-02-03 ENCOUNTER — Ambulatory Visit: Payer: Self-pay | Admitting: Internal Medicine

## 2012-02-14 LAB — CBC CANCER CENTER
Basophil #: 0 x10 3/mm (ref 0.0–0.1)
Basophil %: 0.8 %
Eosinophil #: 0 x10 3/mm (ref 0.0–0.7)
Eosinophil %: 0.2 %
HCT: 33.7 % — ABNORMAL LOW (ref 40.0–52.0)
Lymphocyte #: 0.8 x10 3/mm — ABNORMAL LOW (ref 1.0–3.6)
MCH: 32.6 pg (ref 26.0–34.0)
MCHC: 32.7 g/dL (ref 32.0–36.0)
MCV: 100 fL (ref 80–100)
Monocyte %: 11.1 %
Neutrophil #: 1.9 x10 3/mm (ref 1.4–6.5)
Neutrophil %: 61.3 %
RBC: 3.38 10*6/uL — ABNORMAL LOW (ref 4.40–5.90)
RDW: 18.8 % — ABNORMAL HIGH (ref 11.5–14.5)

## 2012-02-14 LAB — BASIC METABOLIC PANEL
Anion Gap: 10 (ref 7–16)
BUN: 8 mg/dL (ref 7–18)
Calcium, Total: 8.8 mg/dL (ref 8.5–10.1)
Chloride: 107 mmol/L (ref 98–107)
Co2: 24 mmol/L (ref 21–32)
EGFR (Non-African Amer.): >60
Potassium: 3.9 mmol/L (ref 3.5–5.1)
Sodium: 141 mmol/L (ref 136–145)

## 2012-03-04 ENCOUNTER — Ambulatory Visit: Payer: Self-pay | Admitting: Internal Medicine

## 2012-03-18 LAB — CREATININE, SERUM: EGFR (Non-African Amer.): >60

## 2012-03-20 LAB — CBC CANCER CENTER
Basophil #: 0.1 x10 3/mm (ref 0.0–0.1)
Basophil %: 1.7 %
Eosinophil #: 0 x10 3/mm (ref 0.0–0.7)
HGB: 12.5 g/dL — ABNORMAL LOW (ref 13.0–18.0)
Lymphocyte #: 1.1 x10 3/mm (ref 1.0–3.6)
MCH: 33.9 pg (ref 26.0–34.0)
MCHC: 32.5 g/dL (ref 32.0–36.0)
Monocyte #: 0.5 x10 3/mm (ref 0.2–1.0)
Neutrophil #: 3.2 x10 3/mm (ref 1.4–6.5)
RDW: 18.5 % — ABNORMAL HIGH (ref 11.5–14.5)

## 2012-03-20 LAB — BASIC METABOLIC PANEL
Creatinine: 0.99 mg/dL (ref 0.60–1.30)
EGFR (African American): >60
EGFR (Non-African Amer.): >60
Glucose: 97 mg/dL (ref 65–99)
Sodium: 142 mmol/L (ref 136–145)

## 2012-03-20 LAB — HEPATIC FUNCTION PANEL A (ARMC)
Alkaline Phosphatase: 66 U/L (ref 50–136)
Bilirubin,Total: 0.4 mg/dL (ref 0.2–1.0)
SGPT (ALT): 18 U/L

## 2012-04-04 ENCOUNTER — Ambulatory Visit: Payer: Self-pay

## 2012-04-04 ENCOUNTER — Ambulatory Visit: Payer: Self-pay | Admitting: Internal Medicine

## 2012-05-28 ENCOUNTER — Ambulatory Visit: Payer: Self-pay | Admitting: Internal Medicine

## 2012-06-24 ENCOUNTER — Ambulatory Visit: Payer: Self-pay | Admitting: Internal Medicine

## 2012-06-24 LAB — CBC CANCER CENTER
HGB: 13.6 g/dL (ref 13.0–18.0)
Lymphocyte #: 1 x10 3/mm (ref 1.0–3.6)
Lymphocyte %: 29.3 %
MCH: 33.2 pg (ref 26.0–34.0)
MCHC: 32.3 g/dL (ref 32.0–36.0)
MCV: 103 fL — ABNORMAL HIGH (ref 80–100)
Monocyte #: 0.3 x10 3/mm (ref 0.2–1.0)
Monocyte %: 8.4 %
Neutrophil #: 2.1 x10 3/mm (ref 1.4–6.5)
Neutrophil %: 58.6 %
RBC: 4.09 10*6/uL — ABNORMAL LOW (ref 4.40–5.90)

## 2012-06-24 LAB — BASIC METABOLIC PANEL
Anion Gap: 9 (ref 7–16)
Creatinine: 0.92 mg/dL (ref 0.60–1.30)
EGFR (African American): >60
EGFR (Non-African Amer.): >60
Glucose: 86 mg/dL (ref 65–99)
Sodium: 139 mmol/L (ref 136–145)

## 2012-06-24 LAB — HEPATIC FUNCTION PANEL A (ARMC)
Albumin: 3.2 g/dL — ABNORMAL LOW (ref 3.4–5.0)
Bilirubin, Direct: 0.1 mg/dL (ref 0.00–0.20)
Bilirubin,Total: 0.4 mg/dL (ref 0.2–1.0)
Total Protein: 6.9 g/dL (ref 6.4–8.2)

## 2012-06-24 LAB — MAGNESIUM: Magnesium: 1.6 mg/dL — ABNORMAL LOW

## 2012-07-05 ENCOUNTER — Ambulatory Visit: Payer: Self-pay | Admitting: Internal Medicine

## 2012-07-05 ENCOUNTER — Ambulatory Visit: Payer: Self-pay

## 2012-09-04 ENCOUNTER — Ambulatory Visit: Payer: Self-pay | Admitting: Internal Medicine

## 2012-09-18 LAB — CBC CANCER CENTER
Basophil #: 0 x10 3/mm (ref 0.0–0.1)
Basophil %: 1 %
Eosinophil #: 0.2 x10 3/mm (ref 0.0–0.7)
HGB: 14 g/dL (ref 13.0–18.0)
Lymphocyte #: 1.2 x10 3/mm (ref 1.0–3.6)
MCHC: 34 g/dL (ref 32.0–36.0)
Monocyte %: 8.1 %
Neutrophil #: 2.5 x10 3/mm (ref 1.4–6.5)
Platelet: 228 x10 3/mm (ref 150–440)
RDW: 16.3 % — ABNORMAL HIGH (ref 11.5–14.5)

## 2012-09-18 LAB — HEPATIC FUNCTION PANEL A (ARMC)
Alkaline Phosphatase: 131 U/L (ref 50–136)
SGOT(AST): 21 U/L (ref 15–37)
SGPT (ALT): 17 U/L (ref 12–78)
Total Protein: 7.8 g/dL (ref 6.4–8.2)

## 2012-09-18 LAB — CALCIUM: Calcium, Total: 8.8 mg/dL (ref 8.5–10.1)

## 2012-09-18 LAB — CREATININE, SERUM: EGFR (African American): >60

## 2012-10-05 ENCOUNTER — Ambulatory Visit: Payer: Self-pay | Admitting: Internal Medicine

## 2012-12-24 ENCOUNTER — Ambulatory Visit: Payer: Self-pay | Admitting: Internal Medicine

## 2013-08-27 ENCOUNTER — Emergency Department: Payer: Self-pay | Admitting: Emergency Medicine

## 2013-08-27 LAB — CBC
HCT: 41.7 % (ref 40.0–52.0)
HGB: 13.2 g/dL (ref 13.0–18.0)
MCHC: 31.7 g/dL — ABNORMAL LOW (ref 32.0–36.0)
MCV: 111 fL — ABNORMAL HIGH (ref 80–100)
Platelet: 158 10*3/uL (ref 150–440)
WBC: 4.2 10*3/uL (ref 3.8–10.6)

## 2013-08-27 LAB — URINALYSIS, COMPLETE
Blood: NEGATIVE
Glucose,UR: NEGATIVE mg/dL (ref 0–75)
Ketone: NEGATIVE
Leukocyte Esterase: NEGATIVE
Ph: 5 (ref 4.5–8.0)
RBC,UR: 1 /HPF (ref 0–5)
Specific Gravity: 1.008 (ref 1.003–1.030)
WBC UR: 2 /HPF (ref 0–5)

## 2013-08-27 LAB — DRUG SCREEN, URINE
Barbiturates, Ur Screen: NEGATIVE (ref ?–200)
Cannabinoid 50 Ng, Ur ~~LOC~~: NEGATIVE (ref ?–50)
Cocaine Metabolite,Ur ~~LOC~~: NEGATIVE (ref ?–300)
MDMA (Ecstasy)Ur Screen: NEGATIVE (ref ?–500)
Methadone, Ur Screen: NEGATIVE (ref ?–300)
Opiate, Ur Screen: NEGATIVE (ref ?–300)
Tricyclic, Ur Screen: NEGATIVE (ref ?–1000)

## 2013-08-27 LAB — ETHANOL: Ethanol: <3 mg/dL

## 2013-08-27 LAB — COMPREHENSIVE METABOLIC PANEL
Alkaline Phosphatase: 94 U/L
Anion Gap: 7 (ref 7–16)
Chloride: 106 mmol/L (ref 98–107)
Co2: 23 mmol/L (ref 21–32)
Creatinine: 0.98 mg/dL (ref 0.60–1.30)
EGFR (African American): >60
EGFR (Non-African Amer.): >60
Glucose: 86 mg/dL (ref 65–99)
Osmolality: 270 (ref 275–301)
Potassium: 3.9 mmol/L (ref 3.5–5.1)
SGOT(AST): 47 U/L — ABNORMAL HIGH (ref 15–37)
Total Protein: 7.7 g/dL (ref 6.4–8.2)

## 2013-08-27 LAB — TROPONIN I: Troponin-I: <0.02 ng/mL

## 2014-02-02 ENCOUNTER — Ambulatory Visit: Payer: Self-pay | Admitting: Internal Medicine

## 2014-02-11 ENCOUNTER — Inpatient Hospital Stay: Payer: Self-pay | Admitting: Student

## 2014-02-11 LAB — CBC
HCT: 36.7 % — ABNORMAL LOW (ref 40.0–52.0)
HGB: 12.2 g/dL — AB (ref 13.0–18.0)
MCH: 38.1 pg — AB (ref 26.0–34.0)
MCHC: 33.2 g/dL (ref 32.0–36.0)
MCV: 115 fL — ABNORMAL HIGH (ref 80–100)
Platelet: 138 10*3/uL — ABNORMAL LOW (ref 150–440)
RBC: 3.19 10*6/uL — ABNORMAL LOW (ref 4.40–5.90)
RDW: 17.1 % — ABNORMAL HIGH (ref 11.5–14.5)
WBC: 7.5 10*3/uL (ref 3.8–10.6)

## 2014-02-11 LAB — DRUG SCREEN, URINE

## 2014-02-11 LAB — URINALYSIS, COMPLETE
Bilirubin,UR: NEGATIVE
Glucose,UR: NEGATIVE mg/dL (ref 0–75)
Hyaline Cast: 9
Ketone: NEGATIVE
Nitrite: NEGATIVE
PH: 5 (ref 4.5–8.0)
SPECIFIC GRAVITY: 1.016 (ref 1.003–1.030)
WBC UR: 421 /HPF (ref 0–5)

## 2014-02-11 LAB — COMPREHENSIVE METABOLIC PANEL
ANION GAP: 8 (ref 7–16)
AST: 33 U/L (ref 15–37)
Albumin: 2.8 g/dL — ABNORMAL LOW (ref 3.4–5.0)
Alkaline Phosphatase: 129 U/L — ABNORMAL HIGH
BILIRUBIN TOTAL: 1.8 mg/dL — AB (ref 0.2–1.0)
BUN: 11 mg/dL (ref 7–18)
CALCIUM: 8.3 mg/dL — AB (ref 8.5–10.1)
CHLORIDE: 92 mmol/L — AB (ref 98–107)
CO2: 29 mmol/L (ref 21–32)
CREATININE: 1.44 mg/dL — AB (ref 0.60–1.30)
EGFR (Non-African Amer.): 51 — ABNORMAL LOW
GFR CALC AF AMER: 59 — AB
Glucose: 100 mg/dL — ABNORMAL HIGH (ref 65–99)
Osmolality: 258 (ref 275–301)
Potassium: 3.5 mmol/L (ref 3.5–5.1)
SGPT (ALT): 15 U/L (ref 12–78)
Sodium: 129 mmol/L — ABNORMAL LOW (ref 136–145)
TOTAL PROTEIN: 6.6 g/dL (ref 6.4–8.2)

## 2014-02-11 LAB — TROPONIN I: Troponin-I: <0.02 ng/mL

## 2014-02-11 LAB — CK TOTAL AND CKMB (NOT AT ARMC)
CK, TOTAL: 41 U/L
CK, Total: 74 U/L
CK, Total: 58 U/L
CK-MB: 1.1 ng/mL (ref 0.5–3.6)
CK-MB: 1.6 ng/mL (ref 0.5–3.6)
CK-MB: 2 ng/mL (ref 0.5–3.6)

## 2014-02-11 LAB — PROTIME-INR
INR: 1
Prothrombin Time: 13.3 secs (ref 11.5–14.7)

## 2014-02-11 LAB — AMMONIA: Ammonia, Plasma: 17 mcmol/L (ref 11–32)

## 2014-02-11 LAB — APTT: Activated PTT: 27 secs (ref 23.6–35.9)

## 2014-02-11 LAB — ETHANOL: Ethanol %: <0.003 % (ref 0.000–0.080)

## 2014-02-11 LAB — VALPROIC ACID LEVEL: Valproic Acid: <3 ug/mL — ABNORMAL LOW

## 2014-02-11 LAB — PHENYTOIN LEVEL, TOTAL: Dilantin: <0.4 ug/mL — ABNORMAL LOW (ref 10.0–20.0)

## 2014-02-12 LAB — CBC WITH DIFFERENTIAL/PLATELET
BASOS PCT: 0.4 %
Basophil #: 0 10*3/uL (ref 0.0–0.1)
EOS ABS: 0 10*3/uL (ref 0.0–0.7)
EOS PCT: 0.3 %
HCT: 28.7 % — ABNORMAL LOW (ref 40.0–52.0)
HGB: 9.4 g/dL — ABNORMAL LOW (ref 13.0–18.0)
Lymphocyte #: 1.3 10*3/uL (ref 1.0–3.6)
Lymphocyte %: 19.9 %
MCH: 38 pg — AB (ref 26.0–34.0)
MCHC: 32.7 g/dL (ref 32.0–36.0)
MCV: 117 fL — ABNORMAL HIGH (ref 80–100)
Monocyte #: 0.3 x10 3/mm (ref 0.2–1.0)
Monocyte %: 4 %
Neutrophil #: 5.1 10*3/uL (ref 1.4–6.5)
Neutrophil %: 75.4 %
PLATELETS: 108 10*3/uL — AB (ref 150–440)
RBC: 2.47 10*6/uL — ABNORMAL LOW (ref 4.40–5.90)
RDW: 17.6 % — ABNORMAL HIGH (ref 11.5–14.5)
WBC: 6.8 10*3/uL (ref 3.8–10.6)

## 2014-02-12 LAB — COMPREHENSIVE METABOLIC PANEL
ALT: 11 U/L — AB (ref 12–78)
ANION GAP: 8 (ref 7–16)
AST: 23 U/L (ref 15–37)
Albumin: 2 g/dL — ABNORMAL LOW (ref 3.4–5.0)
Alkaline Phosphatase: 89 U/L
BUN: 7 mg/dL (ref 7–18)
Bilirubin,Total: 1 mg/dL (ref 0.2–1.0)
CO2: 26 mmol/L (ref 21–32)
CREATININE: 0.91 mg/dL (ref 0.60–1.30)
Calcium, Total: 6.9 mg/dL — CL (ref 8.5–10.1)
Chloride: 102 mmol/L (ref 98–107)
EGFR (African American): >60
EGFR (Non-African Amer.): >60
Glucose: 213 mg/dL — ABNORMAL HIGH (ref 65–99)
OSMOLALITY: 276 (ref 275–301)
Potassium: 2.8 mmol/L — ABNORMAL LOW (ref 3.5–5.1)
Sodium: 136 mmol/L (ref 136–145)
Total Protein: 4.9 g/dL — ABNORMAL LOW (ref 6.4–8.2)

## 2014-02-12 LAB — MAGNESIUM
MAGNESIUM: 1.2 mg/dL — AB
Magnesium: 2.1 mg/dL

## 2014-02-12 LAB — POTASSIUM
POTASSIUM: 5 mmol/L (ref 3.5–5.1)
Potassium: 3.3 mmol/L — ABNORMAL LOW (ref 3.5–5.1)

## 2014-02-12 LAB — TSH: Thyroid Stimulating Horm: 0.57 u[IU]/mL

## 2014-02-13 LAB — CBC WITH DIFFERENTIAL/PLATELET
Basophil #: 0 10*3/uL (ref 0.0–0.1)
Basophil %: 0.3 %
Eosinophil #: 0 10*3/uL (ref 0.0–0.7)
Eosinophil %: 0.5 %
HCT: 28.5 % — ABNORMAL LOW (ref 40.0–52.0)
HGB: 9.3 g/dL — ABNORMAL LOW (ref 13.0–18.0)
Lymphocyte #: 0.4 10*3/uL — ABNORMAL LOW (ref 1.0–3.6)
Lymphocyte %: 6.6 %
MCH: 38 pg — ABNORMAL HIGH (ref 26.0–34.0)
MCHC: 32.6 g/dL (ref 32.0–36.0)
MCV: 117 fL — ABNORMAL HIGH (ref 80–100)
Monocyte #: 0.2 x10 3/mm (ref 0.2–1.0)
Monocyte %: 3.5 %
Neutrophil #: 5.5 10*3/uL (ref 1.4–6.5)
Neutrophil %: 89.1 %
Platelet: 83 10*3/uL — ABNORMAL LOW (ref 150–440)
RBC: 2.45 10*6/uL — ABNORMAL LOW (ref 4.40–5.90)
RDW: 17.3 % — ABNORMAL HIGH (ref 11.5–14.5)
WBC: 6.2 10*3/uL (ref 3.8–10.6)

## 2014-02-13 LAB — BASIC METABOLIC PANEL
Anion Gap: 7 (ref 7–16)
BUN: 5 mg/dL — ABNORMAL LOW (ref 7–18)
CALCIUM: 7.5 mg/dL — AB (ref 8.5–10.1)
CREATININE: 0.73 mg/dL (ref 0.60–1.30)
Chloride: 104 mmol/L (ref 98–107)
Co2: 25 mmol/L (ref 21–32)
Glucose: 116 mg/dL — ABNORMAL HIGH (ref 65–99)
OSMOLALITY: 270 (ref 275–301)
Potassium: 3.6 mmol/L (ref 3.5–5.1)
SODIUM: 136 mmol/L (ref 136–145)

## 2014-02-13 LAB — PHOSPHORUS: Phosphorus: 3.3 mg/dL (ref 2.5–4.9)

## 2014-02-13 LAB — MAGNESIUM: Magnesium: 1.7 mg/dL — ABNORMAL LOW

## 2014-02-14 LAB — CBC WITH DIFFERENTIAL/PLATELET
BASOS ABS: 0 10*3/uL (ref 0.0–0.1)
Basophil %: 0.3 %
Eosinophil #: 0.1 10*3/uL (ref 0.0–0.7)
Eosinophil %: 1 %
HCT: 27 % — AB (ref 40.0–52.0)
HGB: 9.2 g/dL — ABNORMAL LOW (ref 13.0–18.0)
Lymphocyte #: 0.5 10*3/uL — ABNORMAL LOW (ref 1.0–3.6)
Lymphocyte %: 9.7 %
MCH: 39.3 pg — ABNORMAL HIGH (ref 26.0–34.0)
MCHC: 33.9 g/dL (ref 32.0–36.0)
MCV: 116 fL — AB (ref 80–100)
MONO ABS: 0.3 x10 3/mm (ref 0.2–1.0)
Monocyte %: 5.8 %
Neutrophil #: 4.7 10*3/uL (ref 1.4–6.5)
Neutrophil %: 83.2 %
Platelet: 74 10*3/uL — ABNORMAL LOW (ref 150–440)
RBC: 2.33 10*6/uL — AB (ref 4.40–5.90)
RDW: 17.2 % — ABNORMAL HIGH (ref 11.5–14.5)
WBC: 5.6 10*3/uL (ref 3.8–10.6)

## 2014-02-14 LAB — BASIC METABOLIC PANEL
Anion Gap: 8 (ref 7–16)
BUN: 4 mg/dL — ABNORMAL LOW (ref 7–18)
CALCIUM: 7.6 mg/dL — AB (ref 8.5–10.1)
CHLORIDE: 104 mmol/L (ref 98–107)
CO2: 25 mmol/L (ref 21–32)
Creatinine: 0.79 mg/dL (ref 0.60–1.30)
EGFR (African American): >60
EGFR (Non-African Amer.): >60
GLUCOSE: 53 mg/dL — AB (ref 65–99)
OSMOLALITY: 268 (ref 275–301)
POTASSIUM: 3.6 mmol/L (ref 3.5–5.1)
Sodium: 137 mmol/L (ref 136–145)

## 2014-02-14 LAB — EXPECTORATED SPUTUM ASSESSMENT W GRAM STAIN, RFLX TO RESP C

## 2014-02-15 LAB — BASIC METABOLIC PANEL
Anion Gap: 6 — ABNORMAL LOW (ref 7–16)
BUN: 2 mg/dL — ABNORMAL LOW (ref 7–18)
CHLORIDE: 103 mmol/L (ref 98–107)
CREATININE: 0.93 mg/dL (ref 0.60–1.30)
Calcium, Total: 7.4 mg/dL — ABNORMAL LOW (ref 8.5–10.1)
Co2: 25 mmol/L (ref 21–32)
EGFR (African American): >60
EGFR (Non-African Amer.): >60
Glucose: 124 mg/dL — ABNORMAL HIGH (ref 65–99)
Osmolality: 266 (ref 275–301)
Potassium: 3.1 mmol/L — ABNORMAL LOW (ref 3.5–5.1)
Sodium: 134 mmol/L — ABNORMAL LOW (ref 136–145)

## 2014-02-15 LAB — URINE CULTURE

## 2014-02-15 LAB — POTASSIUM: Potassium: 3.4 mmol/L — ABNORMAL LOW (ref 3.5–5.1)

## 2014-02-15 LAB — PLATELET COUNT: Platelet: 87 10*3/uL — ABNORMAL LOW (ref 150–440)

## 2014-02-16 LAB — BASIC METABOLIC PANEL
Anion Gap: 7 (ref 7–16)
BUN: 2 mg/dL — ABNORMAL LOW (ref 7–18)
CALCIUM: 7.7 mg/dL — AB (ref 8.5–10.1)
CO2: 23 mmol/L (ref 21–32)
Chloride: 105 mmol/L (ref 98–107)
Creatinine: 0.66 mg/dL (ref 0.60–1.30)
EGFR (African American): >60
EGFR (Non-African Amer.): >60
GLUCOSE: 150 mg/dL — AB (ref 65–99)
OSMOLALITY: 269 (ref 275–301)
Potassium: 3.3 mmol/L — ABNORMAL LOW (ref 3.5–5.1)
SODIUM: 135 mmol/L — AB (ref 136–145)

## 2014-02-16 LAB — CULTURE, BLOOD (SINGLE)

## 2014-02-16 LAB — PHENYTOIN LEVEL, TOTAL: Dilantin: 14.1 ug/mL (ref 10.0–20.0)

## 2014-02-16 LAB — ALBUMIN: Albumin: 1.6 g/dL — ABNORMAL LOW (ref 3.4–5.0)

## 2014-02-17 LAB — POTASSIUM
POTASSIUM: 3.2 mmol/L — AB (ref 3.5–5.1)
Potassium: 3.9 mmol/L (ref 3.5–5.1)

## 2014-02-17 LAB — CBC WITH DIFFERENTIAL/PLATELET
Basophil #: 0.1 10*3/uL (ref 0.0–0.1)
Basophil %: 0.2 %
EOS ABS: 0.3 10*3/uL (ref 0.0–0.7)
Eosinophil %: 1 %
HCT: 28.7 % — AB (ref 40.0–52.0)
HGB: 9.5 g/dL — AB (ref 13.0–18.0)
LYMPHS PCT: 2 %
Lymphocyte #: 0.6 10*3/uL — ABNORMAL LOW (ref 1.0–3.6)
MCH: 38.3 pg — AB (ref 26.0–34.0)
MCHC: 33.1 g/dL (ref 32.0–36.0)
MCV: 116 fL — ABNORMAL HIGH (ref 80–100)
MONO ABS: 1.2 x10 3/mm — AB (ref 0.2–1.0)
Monocyte %: 3.9 %
NEUTROS PCT: 92.9 %
Neutrophil #: 28.3 10*3/uL — ABNORMAL HIGH (ref 1.4–6.5)
Platelet: 116 10*3/uL — ABNORMAL LOW (ref 150–440)
RBC: 2.49 10*6/uL — ABNORMAL LOW (ref 4.40–5.90)
RDW: 17.4 % — ABNORMAL HIGH (ref 11.5–14.5)
WBC: 30.4 10*3/uL — ABNORMAL HIGH (ref 3.8–10.6)

## 2014-02-17 LAB — BASIC METABOLIC PANEL
ANION GAP: 7 (ref 7–16)
BUN: 3 mg/dL — AB (ref 7–18)
CALCIUM: 7.7 mg/dL — AB (ref 8.5–10.1)
CHLORIDE: 103 mmol/L (ref 98–107)
Co2: 25 mmol/L (ref 21–32)
Creatinine: 0.83 mg/dL (ref 0.60–1.30)
EGFR (Non-African Amer.): >60
Glucose: 129 mg/dL — ABNORMAL HIGH (ref 65–99)
Osmolality: 268 (ref 275–301)
Potassium: 3 mmol/L — ABNORMAL LOW (ref 3.5–5.1)
Sodium: 135 mmol/L — ABNORMAL LOW (ref 136–145)

## 2014-02-17 LAB — MAGNESIUM: Magnesium: 1.2 mg/dL — ABNORMAL LOW

## 2014-02-17 LAB — PHOSPHORUS: PHOSPHORUS: 2.4 mg/dL — AB (ref 2.5–4.9)

## 2014-02-17 LAB — PHENYTOIN LEVEL, TOTAL: Dilantin: 10.8 ug/mL (ref 10.0–20.0)

## 2014-02-18 LAB — CBC WITH DIFFERENTIAL/PLATELET
BASOS ABS: 0 10*3/uL (ref 0.0–0.1)
BASOS PCT: 0.2 %
EOS PCT: 1.4 %
Eosinophil #: 0.2 10*3/uL (ref 0.0–0.7)
HCT: 26.7 % — AB (ref 40.0–52.0)
HGB: 9 g/dL — ABNORMAL LOW (ref 13.0–18.0)
LYMPHS ABS: 0.5 10*3/uL — AB (ref 1.0–3.6)
LYMPHS PCT: 3.5 %
MCH: 38.9 pg — ABNORMAL HIGH (ref 26.0–34.0)
MCHC: 33.7 g/dL (ref 32.0–36.0)
MCV: 115 fL — ABNORMAL HIGH (ref 80–100)
MONOS PCT: 4.3 %
Monocyte #: 0.6 x10 3/mm (ref 0.2–1.0)
NEUTROS ABS: 13.5 10*3/uL — AB (ref 1.4–6.5)
Neutrophil %: 90.6 %
Platelet: 142 10*3/uL — ABNORMAL LOW (ref 150–440)
RBC: 2.32 10*6/uL — ABNORMAL LOW (ref 4.40–5.90)
RDW: 17.5 % — ABNORMAL HIGH (ref 11.5–14.5)
WBC: 15 10*3/uL — AB (ref 3.8–10.6)

## 2014-02-18 LAB — PHENYTOIN LEVEL, TOTAL: DILANTIN: 9.5 ug/mL — AB (ref 10.0–20.0)

## 2014-02-18 LAB — PHOSPHORUS: Phosphorus: 1.6 mg/dL — ABNORMAL LOW (ref 2.5–4.9)

## 2014-02-18 LAB — BASIC METABOLIC PANEL
ANION GAP: 6 — AB (ref 7–16)
BUN: 4 mg/dL — ABNORMAL LOW (ref 7–18)
CALCIUM: 7.7 mg/dL — AB (ref 8.5–10.1)
CHLORIDE: 108 mmol/L — AB (ref 98–107)
CO2: 26 mmol/L (ref 21–32)
Creatinine: 0.76 mg/dL (ref 0.60–1.30)
EGFR (Non-African Amer.): >60
GLUCOSE: 103 mg/dL — AB (ref 65–99)
Osmolality: 277 (ref 275–301)
Potassium: 3.7 mmol/L (ref 3.5–5.1)
Sodium: 140 mmol/L (ref 136–145)

## 2014-02-18 LAB — MAGNESIUM: Magnesium: 1.6 mg/dL — ABNORMAL LOW

## 2014-02-18 LAB — ALBUMIN: Albumin: 1.5 g/dL — ABNORMAL LOW (ref 3.4–5.0)

## 2014-02-19 LAB — PHOSPHORUS: PHOSPHORUS: 1.5 mg/dL — AB (ref 2.5–4.9)

## 2014-02-19 LAB — MAGNESIUM: MAGNESIUM: 1.8 mg/dL

## 2014-02-19 LAB — BASIC METABOLIC PANEL
Anion Gap: 6 — ABNORMAL LOW (ref 7–16)
BUN: 7 mg/dL (ref 7–18)
CALCIUM: 8 mg/dL — AB (ref 8.5–10.1)
CO2: 26 mmol/L (ref 21–32)
Chloride: 107 mmol/L (ref 98–107)
Creatinine: 0.84 mg/dL (ref 0.60–1.30)
EGFR (Non-African Amer.): >60
Glucose: 89 mg/dL (ref 65–99)
OSMOLALITY: 275 (ref 275–301)
POTASSIUM: 3.7 mmol/L (ref 3.5–5.1)
Sodium: 139 mmol/L (ref 136–145)

## 2014-02-20 LAB — CBC WITH DIFFERENTIAL/PLATELET
HCT: 25.2 % — ABNORMAL LOW (ref 40.0–52.0)
HGB: 8.3 g/dL — AB (ref 13.0–18.0)
Lymphocytes: 12 %
MCH: 37.5 pg — ABNORMAL HIGH (ref 26.0–34.0)
MCHC: 33 g/dL (ref 32.0–36.0)
MCV: 114 fL — AB (ref 80–100)
MYELOCYTE: 2 %
Metamyelocyte: 1 %
Monocytes: 8 %
NRBC/100 WBC: 1 /
PLATELETS: 147 10*3/uL — AB (ref 150–440)
RBC: 2.22 10*6/uL — AB (ref 4.40–5.90)
RDW: 18.1 % — ABNORMAL HIGH (ref 11.5–14.5)
SEGMENTED NEUTROPHILS: 77 %
WBC: 7.7 10*3/uL (ref 3.8–10.6)

## 2014-02-20 LAB — BASIC METABOLIC PANEL
ANION GAP: 6 — AB (ref 7–16)
BUN: 13 mg/dL (ref 7–18)
CALCIUM: 8.3 mg/dL — AB (ref 8.5–10.1)
CHLORIDE: 103 mmol/L (ref 98–107)
CO2: 29 mmol/L (ref 21–32)
CREATININE: 0.88 mg/dL (ref 0.60–1.30)
EGFR (African American): >60
EGFR (Non-African Amer.): >60
GLUCOSE: 104 mg/dL — AB (ref 65–99)
Osmolality: 276 (ref 275–301)
POTASSIUM: 3.5 mmol/L (ref 3.5–5.1)
SODIUM: 138 mmol/L (ref 136–145)

## 2014-02-20 LAB — PHOSPHORUS: Phosphorus: 1.8 mg/dL — ABNORMAL LOW (ref 2.5–4.9)

## 2014-02-20 LAB — MAGNESIUM: Magnesium: 1.8 mg/dL

## 2014-02-21 LAB — ALBUMIN: Albumin: 1.8 g/dL — ABNORMAL LOW (ref 3.4–5.0)

## 2014-02-21 LAB — GLUCOSE, RANDOM: Glucose: 59 mg/dL — ABNORMAL LOW (ref 65–99)

## 2014-02-21 LAB — PHENYTOIN LEVEL, TOTAL: Dilantin: 6.7 ug/mL — ABNORMAL LOW (ref 10.0–20.0)

## 2014-02-24 LAB — PLATELET COUNT: Platelet: 369 10*3/uL (ref 150–440)

## 2014-02-25 LAB — ALBUMIN: ALBUMIN: 2 g/dL — AB (ref 3.4–5.0)

## 2014-02-25 LAB — PHENYTOIN LEVEL, TOTAL: Dilantin: 3.2 ug/mL — ABNORMAL LOW (ref 10.0–20.0)

## 2014-02-26 LAB — BASIC METABOLIC PANEL
Anion Gap: 8 (ref 7–16)
BUN: 3 mg/dL — AB (ref 7–18)
Calcium, Total: 8 mg/dL — ABNORMAL LOW (ref 8.5–10.1)
Chloride: 113 mmol/L — ABNORMAL HIGH (ref 98–107)
Co2: 23 mmol/L (ref 21–32)
Creatinine: 0.8 mg/dL (ref 0.60–1.30)
EGFR (Non-African Amer.): >60
GLUCOSE: 340 mg/dL — AB (ref 65–99)
Osmolality: 297 (ref 275–301)
Potassium: 2.9 mmol/L — ABNORMAL LOW (ref 3.5–5.1)
Sodium: 144 mmol/L (ref 136–145)

## 2014-02-27 LAB — BASIC METABOLIC PANEL
Anion Gap: 9 (ref 7–16)
BUN: 2 mg/dL — AB (ref 7–18)
CREATININE: 0.76 mg/dL (ref 0.60–1.30)
Calcium, Total: 8.5 mg/dL (ref 8.5–10.1)
Chloride: 112 mmol/L — ABNORMAL HIGH (ref 98–107)
Co2: 25 mmol/L (ref 21–32)
EGFR (African American): >60
Glucose: 123 mg/dL — ABNORMAL HIGH (ref 65–99)
OSMOLALITY: 288 (ref 275–301)
Potassium: 3.2 mmol/L — ABNORMAL LOW (ref 3.5–5.1)
SODIUM: 146 mmol/L — AB (ref 136–145)

## 2014-02-27 LAB — PHENYTOIN LEVEL, TOTAL: Dilantin: 7.2 ug/mL — ABNORMAL LOW (ref 10.0–20.0)

## 2014-02-27 LAB — ALBUMIN: Albumin: 2 g/dL — ABNORMAL LOW (ref 3.4–5.0)

## 2014-02-28 LAB — PLATELET COUNT: Platelet: 465 10*3/uL — ABNORMAL HIGH (ref 150–440)

## 2014-03-01 LAB — ALBUMIN: Albumin: 2.1 g/dL — ABNORMAL LOW (ref 3.4–5.0)

## 2014-03-01 LAB — PHENYTOIN LEVEL, TOTAL: Dilantin: 3.5 ug/mL — ABNORMAL LOW (ref 10.0–20.0)

## 2014-03-04 ENCOUNTER — Ambulatory Visit: Payer: Self-pay | Admitting: Internal Medicine

## 2014-03-21 ENCOUNTER — Emergency Department: Payer: Self-pay | Admitting: Emergency Medicine

## 2014-03-21 LAB — COMPREHENSIVE METABOLIC PANEL
ANION GAP: 8 (ref 7–16)
Albumin: 3.1 g/dL — ABNORMAL LOW (ref 3.4–5.0)
Alkaline Phosphatase: 118 U/L — ABNORMAL HIGH
BILIRUBIN TOTAL: 0.2 mg/dL (ref 0.2–1.0)
BUN: 7 mg/dL (ref 7–18)
CO2: 24 mmol/L (ref 21–32)
Calcium, Total: 8.3 mg/dL — ABNORMAL LOW (ref 8.5–10.1)
Chloride: 107 mmol/L (ref 98–107)
Creatinine: 0.97 mg/dL (ref 0.60–1.30)
EGFR (African American): >60
EGFR (Non-African Amer.): >60
Glucose: 70 mg/dL (ref 65–99)
OSMOLALITY: 274 (ref 275–301)
Potassium: 4.3 mmol/L (ref 3.5–5.1)
SGOT(AST): 32 U/L (ref 15–37)
SGPT (ALT): 27 U/L (ref 12–78)
Sodium: 139 mmol/L (ref 136–145)
Total Protein: 7.5 g/dL (ref 6.4–8.2)

## 2014-03-21 LAB — ETHANOL
Ethanol %: <0.003 % (ref 0.000–0.080)
Ethanol: <3 mg/dL

## 2014-03-21 LAB — CBC
HCT: 36.4 % — AB (ref 40.0–52.0)
HGB: 11.8 g/dL — AB (ref 13.0–18.0)
MCH: 33.7 pg (ref 26.0–34.0)
MCHC: 32.3 g/dL (ref 32.0–36.0)
MCV: 104 fL — AB (ref 80–100)
Platelet: 286 10*3/uL (ref 150–440)
RBC: 3.49 10*6/uL — AB (ref 4.40–5.90)
RDW: 17.2 % — AB (ref 11.5–14.5)
WBC: 4.9 10*3/uL (ref 3.8–10.6)

## 2014-03-21 LAB — ACETAMINOPHEN LEVEL: Acetaminophen: <2 ug/mL

## 2014-03-21 LAB — TSH: THYROID STIMULATING HORM: 2.32 u[IU]/mL

## 2014-03-21 LAB — SALICYLATE LEVEL: Salicylates, Serum: <1.7 mg/dL

## 2014-03-22 LAB — URINALYSIS, COMPLETE
BILIRUBIN, UR: NEGATIVE
Bacteria: NONE SEEN
Blood: NEGATIVE
Glucose,UR: NEGATIVE mg/dL (ref 0–75)
Ketone: NEGATIVE
Leukocyte Esterase: NEGATIVE
Nitrite: NEGATIVE
Ph: 6 (ref 4.5–8.0)
Protein: NEGATIVE
SPECIFIC GRAVITY: 1.015 (ref 1.003–1.030)
Squamous Epithelial: 2
WBC UR: 5 /HPF (ref 0–5)

## 2014-03-22 LAB — DRUG SCREEN, URINE
Amphetamines, Ur Screen: NEGATIVE (ref ?–1000)
BARBITURATES, UR SCREEN: NEGATIVE (ref ?–200)
BENZODIAZEPINE, UR SCRN: NEGATIVE (ref ?–200)
Cannabinoid 50 Ng, Ur ~~LOC~~: NEGATIVE (ref ?–50)
Cocaine Metabolite,Ur ~~LOC~~: NEGATIVE (ref ?–300)
MDMA (ECSTASY) UR SCREEN: NEGATIVE (ref ?–500)
Methadone, Ur Screen: NEGATIVE (ref ?–300)
OPIATE, UR SCREEN: NEGATIVE (ref ?–300)
PHENCYCLIDINE (PCP) UR S: NEGATIVE (ref ?–25)
TRICYCLIC, UR SCREEN: NEGATIVE (ref ?–1000)

## 2014-06-25 LAB — URINALYSIS, COMPLETE
BILIRUBIN, UR: NEGATIVE
BLOOD: NEGATIVE
GLUCOSE, UR: NEGATIVE mg/dL (ref 0–75)
KETONE: NEGATIVE
NITRITE: NEGATIVE
Ph: 6 (ref 4.5–8.0)
Protein: NEGATIVE
RBC,UR: 15 /HPF (ref 0–5)
Specific Gravity: 1.012 (ref 1.003–1.030)
Squamous Epithelial: <1
WBC UR: 223 /HPF (ref 0–5)

## 2014-06-25 LAB — CBC WITH DIFFERENTIAL/PLATELET
Basophil #: 0.1 10*3/uL (ref 0.0–0.1)
Basophil %: 1.2 %
EOS ABS: 0.1 10*3/uL (ref 0.0–0.7)
Eosinophil %: 1.9 %
HCT: 37.6 % — AB (ref 40.0–52.0)
HGB: 12.2 g/dL — AB (ref 13.0–18.0)
LYMPHS ABS: 1.2 10*3/uL (ref 1.0–3.6)
LYMPHS PCT: 23.8 %
MCH: 31 pg (ref 26.0–34.0)
MCHC: 32.4 g/dL (ref 32.0–36.0)
MCV: 96 fL (ref 80–100)
MONO ABS: 0.4 x10 3/mm (ref 0.2–1.0)
Monocyte %: 8.7 %
NEUTROS ABS: 3.1 10*3/uL (ref 1.4–6.5)
NEUTROS PCT: 64.4 %
PLATELETS: 232 10*3/uL (ref 150–440)
RBC: 3.93 10*6/uL — ABNORMAL LOW (ref 4.40–5.90)
RDW: 17.1 % — ABNORMAL HIGH (ref 11.5–14.5)
WBC: 4.9 10*3/uL (ref 3.8–10.6)

## 2014-06-25 LAB — BASIC METABOLIC PANEL
ANION GAP: 9 (ref 7–16)
BUN: 9 mg/dL (ref 7–18)
CALCIUM: 8.5 mg/dL (ref 8.5–10.1)
CO2: 24 mmol/L (ref 21–32)
Chloride: 105 mmol/L (ref 98–107)
Creatinine: 0.73 mg/dL (ref 0.60–1.30)
EGFR (African American): >60
EGFR (Non-African Amer.): >60
GLUCOSE: 92 mg/dL (ref 65–99)
Osmolality: 274 (ref 275–301)
Potassium: 4.7 mmol/L (ref 3.5–5.1)
SODIUM: 138 mmol/L (ref 136–145)

## 2014-06-25 LAB — TROPONIN I

## 2014-06-25 LAB — PHENYTOIN LEVEL, TOTAL: DILANTIN: 39.5 ug/mL — AB (ref 10.0–20.0)

## 2014-06-26 ENCOUNTER — Inpatient Hospital Stay: Payer: Self-pay | Admitting: Internal Medicine

## 2014-06-26 LAB — CBC WITH DIFFERENTIAL/PLATELET
BASOS ABS: 0 10*3/uL (ref 0.0–0.1)
BASOS PCT: 0.9 %
EOS ABS: 0.2 10*3/uL (ref 0.0–0.7)
Eosinophil %: 6.4 %
HCT: 34.2 % — ABNORMAL LOW (ref 40.0–52.0)
HGB: 11.2 g/dL — ABNORMAL LOW (ref 13.0–18.0)
LYMPHS ABS: 1.1 10*3/uL (ref 1.0–3.6)
LYMPHS PCT: 35.2 %
MCH: 31.1 pg (ref 26.0–34.0)
MCHC: 32.6 g/dL (ref 32.0–36.0)
MCV: 95 fL (ref 80–100)
Monocyte #: 0.4 x10 3/mm (ref 0.2–1.0)
Monocyte %: 11.6 %
NEUTROS PCT: 45.9 %
Neutrophil #: 1.5 10*3/uL (ref 1.4–6.5)
Platelet: 207 10*3/uL (ref 150–440)
RBC: 3.6 10*6/uL — ABNORMAL LOW (ref 4.40–5.90)
RDW: 17.3 % — ABNORMAL HIGH (ref 11.5–14.5)
WBC: 3.2 10*3/uL — ABNORMAL LOW (ref 3.8–10.6)

## 2014-06-26 LAB — BASIC METABOLIC PANEL
Anion Gap: 10 (ref 7–16)
BUN: 8 mg/dL (ref 7–18)
CO2: 25 mmol/L (ref 21–32)
Calcium, Total: 7.6 mg/dL — ABNORMAL LOW (ref 8.5–10.1)
Chloride: 110 mmol/L — ABNORMAL HIGH (ref 98–107)
Creatinine: 0.63 mg/dL (ref 0.60–1.30)
EGFR (African American): >60
Glucose: 72 mg/dL (ref 65–99)
Osmolality: 286 (ref 275–301)
Potassium: 4.5 mmol/L (ref 3.5–5.1)
SODIUM: 145 mmol/L (ref 136–145)

## 2014-06-26 LAB — PHENYTOIN LEVEL, TOTAL: Dilantin: 34.6 ug/mL — ABNORMAL HIGH (ref 10.0–20.0)

## 2014-06-26 LAB — URINE CULTURE

## 2014-06-27 LAB — PHENYTOIN LEVEL, TOTAL: Dilantin: 27.6 ug/mL — ABNORMAL HIGH (ref 10.0–20.0)

## 2014-06-28 LAB — PHENYTOIN LEVEL, TOTAL: Dilantin: 20.8 ug/mL — ABNORMAL HIGH (ref 10.0–20.0)

## 2014-08-14 ENCOUNTER — Emergency Department: Payer: Self-pay | Admitting: Emergency Medicine

## 2014-08-14 LAB — COMPREHENSIVE METABOLIC PANEL
ANION GAP: 6 — AB (ref 7–16)
AST: 21 U/L (ref 15–37)
Albumin: 3.6 g/dL (ref 3.4–5.0)
Alkaline Phosphatase: 90 U/L
BILIRUBIN TOTAL: 0.5 mg/dL (ref 0.2–1.0)
BUN: 7 mg/dL (ref 7–18)
CHLORIDE: 110 mmol/L — AB (ref 98–107)
CREATININE: 0.89 mg/dL (ref 0.60–1.30)
Calcium, Total: 8.4 mg/dL — ABNORMAL LOW (ref 8.5–10.1)
Co2: 27 mmol/L (ref 21–32)
EGFR (Non-African Amer.): >60
Glucose: 88 mg/dL (ref 65–99)
Osmolality: 282 (ref 275–301)
Potassium: 3.9 mmol/L (ref 3.5–5.1)
SGPT (ALT): 20 U/L
Sodium: 143 mmol/L (ref 136–145)
Total Protein: 7.3 g/dL (ref 6.4–8.2)

## 2014-08-14 LAB — DRUG SCREEN, URINE

## 2014-08-14 LAB — URINALYSIS, COMPLETE
Bacteria: NONE SEEN
Bilirubin,UR: NEGATIVE
Blood: NEGATIVE
GLUCOSE, UR: NEGATIVE mg/dL (ref 0–75)
KETONE: NEGATIVE
Leukocyte Esterase: NEGATIVE
Nitrite: NEGATIVE
Ph: 5 (ref 4.5–8.0)
Protein: NEGATIVE
RBC,UR: 1 /HPF (ref 0–5)
Specific Gravity: 1.015 (ref 1.003–1.030)
Squamous Epithelial: 2
WBC UR: 1 /HPF (ref 0–5)

## 2014-08-14 LAB — CBC
HCT: 39.7 % — ABNORMAL LOW (ref 40.0–52.0)
HGB: 12.9 g/dL — ABNORMAL LOW (ref 13.0–18.0)
MCH: 32.4 pg (ref 26.0–34.0)
MCHC: 32.4 g/dL (ref 32.0–36.0)
MCV: 100 fL (ref 80–100)
Platelet: 279 10*3/uL (ref 150–440)
RBC: 3.97 10*6/uL — ABNORMAL LOW (ref 4.40–5.90)
RDW: 14.7 % — ABNORMAL HIGH (ref 11.5–14.5)
WBC: 4.6 10*3/uL (ref 3.8–10.6)

## 2014-08-14 LAB — SALICYLATE LEVEL: Salicylates, Serum: 3.4 mg/dL — ABNORMAL HIGH

## 2014-08-14 LAB — ACETAMINOPHEN LEVEL

## 2014-08-14 LAB — ETHANOL: Ethanol: <3 mg/dL

## 2014-10-16 ENCOUNTER — Inpatient Hospital Stay: Payer: Self-pay | Admitting: Internal Medicine
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
## 2014-12-26 NOTE — Discharge Summary (Signed)
PATIENT NAME:  Calvin [name], CRASS MR#:  [id number] DATE OF BIRTH:  [birth year]  DATE OF ADMISSION:  02/11/2014 DATE OF DISCHARGE:  03/01/2014  ADDENDUM:   This is the addendum to the interim discharge summary dictated by Dr. Verdell Carmine.   HISTORY OF PRESENT ILLNESS: The patient is overall improving. His mentation is stable. Intermittently he gets restless and confused; however, his baseline is stable at this time. His oral input has been improving slowly although not the best. He has been drinking Mighty Shakes and protein shakes that have been offered to him. After several discussions with the daughter regarding discharge plans, she finally agreed for patient to go to [facility] [facility]. This was relayed to me by Education officer, museum. The patient is otherwise hemodynamically stable.   VITAL SIGNS: The saturations are 100% on room air. Blood pressure is 122/75, pulse is 94. Temperature is 97.2.   LABORATORY DATA: His serum Dilantin level is 3.5.   INSTRUCTIONS:  1. Check serum Dilantin level in another 3-4 days.  2. Physical therapy.  3. Dysphagia 1 pureed nectar thick diet. Speech to follow.   MEDICATIONS AT DISCHARGE:  1. Docusate 100 mg b.i.d.  2. Xanax 0.5 mg b.i.d. p.r.n.  3. Aspirin 325 mg p.o. daily.  4. Nicotrol oral inhaler kit 1 q. hour p.r.n. 5. Megace 40 mg b.i.d.  6. MVI p.o. daily.  7. Keppra 500 b.i.d.  8. Potassium 20 mEq p.o. daily.  9. Phenytoin ER 400 mg at bedtime and check serum Dilantin in the next 2-3 days. 10. Protonix 40 mg p.o. daily.  11. Tylenol 650 q. 4 p.r.n.   CODE STATUS: Full code.   The patient had a PICC line which will be removed prior to discharge.   TIME SPENT: 40 minutes.    ____________________________ Hart Rochester Posey Pronto, MD sap:lt D: 03/01/2014 10:56:24 ET T: 03/01/2014 21:24:15 ET JOB#: [id number]  cc: Sona A. Posey Pronto, MD, <Dictator> Ilda Basset MD ELECTRONICALLY SIGNED 03/07/2014 10:25

## 2014-12-26 NOTE — H&P (Signed)
PATIENT NAME:  Calvin [name], Calvin [name] MR#:  [id number] DATE OF BIRTH:  [birth year]  DATE OF ADMISSION:  02/11/2014  REFERRING PHYSICIAN: Dr. Corky Downs.   PRIMARY ONCOLOGIST: Dr. Ma Hillock.   PRIMARY CARE PHYSICIAN: Dr. Lovie Macadamia.   CHIEF COMPLAINT: Came in unresponsive with probable seizure.   HISTORY OF PRESENT ILLNESS: The patient is a 64 year old African American male who is currently intubated, sedated, and there is no family. The information is obtained from discussing with the ER staff, and physicians, as well as the patient's brother-in-law, Sherlynn Carbon, who provided further information. Apparently, per brother-in-law the patient is a daily drinker. 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 When we call the pharmacy, apparently he has not filled any medications for multiple months. He lives with his mother and they both drink. The alcohol is likely delivered to the house by other family members as the patient nor his mom are able to drive. Apparently, per brother-in-law he has been drinking heavily again with a liter for wine as well as a fifth over the last 24 hours. He has been also vomiting. He has not been feeling well without any further information per the brother-in-law except that he "was not feeling well". Then, this morning per Sheran Luz, the patient was found down having active seizures where EMS was called. The patient  was brought in here where he was in respiratory distress with agonal respirations. He had pinpoint pupils, was given Narcan and as he had poor gag reflex and he did not get improved with Narcan, he got intubated. Currently he is sedated on the vent. He has been loaded with Dilantin and hospitalist services were contacted for further evaluation and  management.   PAST MEDICAL HISTORY: Per chart:  1. History of seizure disorder, possibly due to alcohol.  2. Appears to be alcoholic.  3. Microcytic anemia, likely due to alcohol abuse.  4. History of stroke with right-sided weakness.  5. Hypertension.  6. Chronic anemia.  7. Chronic thrombocytopenia.  8. Anxiety disorder.  9. History of sigmoid adenocarcinoma status post resection.  10. History of right hip surgery.  11. History of right pyriform sinus squamous cell cancer.  12. History of Escherichia coli sepsis in 2011.  13. History of polyclonal gammopathy.  14. History of Clostridium difficile in the past.   OUTPATIENT MEDICATIONS: Apparently he is on multiple medications, but has not filled any of his medications since late last year, thus we suspect he has not been taking most of his medications. Of note, the last medication list on file is from 2014 where he supposed to be Carafate, alprazolam, morphine, oxybutynin, Colace, MiraLAX, MS Contin, Delfen, oxycodone, senna, magnesium, vitamin D3, Debrox, Lidoderm patch, Remeron, pantoprazole, Marinol, omeprazole and Combivent.   ALLERGIES: No known drug allergies.   REVIEW OF SYSTEMS: Unable to obtain as the patient is intubated and sedated.   FAMILY HISTORY: Per chart, mother with kidney problems.   SOCIAL HISTORY: He drinks on a daily basis; last drank per brother-in-law was likely overnight. Per chart also is a smoker and lives with his mother.   PHYSICAL EXAMINATION:  IN GENERAL: Currently, he is ill-appearing, unkempt male lying in bed, intubated, sedated.  VITAL SIGNS: Temperature 98.4 on arrival, pulse rate initially 116, respiratory rate 22, blood pressure 122/87,  oxygen saturation 100% on oxygen.  HEENT: Pinpoint pupil. Normocephalic, atraumatic. Muddy sclerae. ET tube in place.  NECK: Does not appear to have any JVD or cervical lymphadenopathy.  CARDIOVASCULAR: S1, S2, tachycardic. No significant murmurs, rubs, or  gallops.  LUNGS: Clear to auscultation with good air entry bilaterally.  ABDOMEN: Soft, nontender, nondistended, positive bowel sounds in all quadrants.  EXTREMITIES: No pitting edema.  SKIN: The patient has very dry scaly sin without any ulcerations or drainage on his feet. No obvious rashes or lesions on examination of the skin.  NEUROLOGIC: Unable to do a full neuro exam. He is intubated, sedated.  PSYCHIATRIC: He is intubated, sedated.   LABORATORIES AND IMAGING: Glucose 100. Initial BUN 11, creatinine 1.44, sodium 129, potassium 3.5. Serum alcohol percentages of less than 0.003. LFTs total bilirubin is 1.8, alkaline phosphatase 129 which appeared to be elevated than prior and albumin is 2.8, AST and ALT within normal limits. Troponin negative. U-tox positive for benzodiazepines, otherwise negative. White count 7.5, hemoglobin 12.2; platelets are 138, MCV 115, INR is 1.   Urinalysis positive with 2+ leukocyte esterase, 421 WBC, 1+ bacteria, 46 epithelial cells. There is WBC clumps as well as mucus. ABG shows pH of 7.55, pCO2 of 29, pO2 of 139 on 40% FiO2 on the vent with PEEP of 5, tidal volume 500, rate 16.   CAT scan of the head: Negative for acute stroke.   X-ray of the chest, ET tube is in place. No acute congestive heart failure or pneumonia, but on lead 2 QRS does appear to be irregular. There is a pulmonary disease pattern. No acute ST elevations.   ASSESSMENT AND PLAN: We have a 64 year old male with multiple medical issues including seizure disorder, alcohol abuse with likely withdrawal seizures in the past, history of sigmoid cancer, hypertension, who is a daily drinker, who was noted to be not feeling well for the past couple of days, while still drinking and noted to have seizure this morning. Currently, he is intubated, sedated. He does have a sepsis per criteria with tachycardia and tachypnea with evidence for a urinary tract infection per urinalysis. He has had no significant  fever here, but also noted to have a seizure. It is possible that the seizure is triggered by alcohol or by the urinary tract infection. Furthermore, the patient did appear to have metabolic encephalopathy, likely due to seizure as well as urinary tract infection. Furthermore, he appears to have a mild acute hyponatremia, as well as acute kidney injury, possibly due to dehydration and sepsis. He was intubated post seizure as he had poor gag reflex, but could also have acute respiratory failure. At this point, he will be admitted to the Critical Care Unit. He would be started on CIWA protocol and pulmonary, as well as neurology will be consulted.   In regards to the metabolic encephalopathy, we have started the patient on ceftriaxone. He has been pancultured and he has been started on fluids. For the seizure, we have ordered an EEG. We have started the patient on Dilantin and neurology has been consulted. He has seizure precautions as well as Ativan p.r.n. and a banana bag ordered. We will ask the aide of pharmacy for Dilantin dosing. Per his med rec from last year he does not appear to be on any seizure medications as his seizures were thought to be due to alcohol, but at this point, we have scarce information, and given the fact that we do not have any accurate medication list  as also he has been noncompliant with medications per his pharmacy the case will be a little challenging. He appears to have mild acute kidney injury and mild acute hyponatremia. He will be started on normal saline in addition to the banana bag. He appears to have elevated liver function tests name, namely alk phos and bilirubin. I will obtain an ultrasound of the right upper quadrant to look at the gallbladder as there was some mention of the patient possibly having some vomiting. Vomiting could have been because of alcohol or urinary tract infection, but the elevated liver function tests should be investigated further. We would continue  with ceftriaxone and wait for cultures. We would also recheck another ABG and start the patient on heparin for deep vein thrombosis prophylaxis.   TOTAL CRITICAL CARE TIME SPENT: Is about 60 minutes.   CODE STATUS: The patient is a full code.    ____________________________ Vivien Presto, MD sa:sg D: 02/11/2014 11:19:00 ET T: 02/11/2014 11:47:59 ET JOB#: [id number]  cc: Vivien Presto, MD, <Dictator> Vivien Presto MD ELECTRONICALLY SIGNED 03/04/2014 13:58

## 2014-12-26 NOTE — Consult Note (Signed)
Brief Consult Note: Diagnosis: Mood Disorder s/p CVA.   Patient was seen by consultant.   Recommend further assessment or treatment.   Comments: Pt seen in ED. He was lying in the bed and was calm. No acute issues noted and has been responding to the medications. He is not having behavioral problems at this time. He denied SI/HI or plans.  Mood appeared depressed but was able to answer to most of the questions appropriately.   Plan: Will continue with meds and monitor him closely.  Electronic Signatures: Jeronimo Norma (MD)  (Signed 23-Jul-15 11:37)  Authored: Brief Consult Note   Last Updated: 23-Jul-15 11:37 by Jeronimo Norma (MD)

## 2014-12-26 NOTE — Discharge Summary (Signed)
PATIENT NAME:  Calvin [name], Calvin [name] MR#:  [id number] DATE OF BIRTH:  12-08-1950  DATE OF ADMISSION:  06/26/2014 DATE OF DISCHARGE:  06/28/2014  CHIEF COMPLAINT AT THE TIME OF ADMISSION: Weakness and dizziness.   ADMITTING DIAGNOSES:  1. Dilantin toxicity.  2. Urinary tract infection.  3. Orthostatic hypotension.  4. Dizziness.   DISCHARGE DIAGNOSES:  1. Acute cystitis secondary to Streptococcus B. 2. Orthostatic hypotension, resolved with intravenous fluids.  3. Dizziness. 4. Dilantin toxicity, resolved, and Dilantin levels were close to baseline.  CONSULTATIONS: None.   PROCEDURES: None.  BRIEF HISTORY AND PHYSICAL AND HOSPITAL COURSE: The patient is a 64 year old male who lives in a group home, is brought into the ED with a chief complaint of weakness and dizziness. Please review history and physical for details. The patient has a history of sigmoid adenocarcinoma. The patient was feeling very dizzy whenever he stands up. The patient is brought into the ED. Orthostatic blood pressures are dropping down whenever the patient is standing up. UA is positive. The patient is diagnosed with acute cystitis and elevated. Dilantin level. The patient is admitted to the hospital with chief diagnoses of urinary tract infection, orthostatic hypotension, dizziness, and elevated Dilantin level. The patient was started with IV Rocephin by the ED physician, and urine cultures were sent.   HOSPITAL COURSE: 1. Acute cystitis was treated with IV Rocephin during the hospital course, and urine cultures have revealed Streptococcus B. As Streptococcus B UTI is sensitive to almost every antibiotic, the patient's IV Rocephin was discontinued. At the time of discharge, the patient was given Bactrim DS to continue. 2. Dilantin toxicity. The patient takes Dilantin for seizures, which was held during the hospital course as the levels were high. Dilantin levels were monitored on a daily basis and, on the day of discharge, the  Dilantin was very close to normal range, and it was at 20.8. The patient was recommended to hold taking Dilantin until evaluated by neurologist. The patient was continued on Keppra and the dose was increased at the time of discharge, and Dilantin was held.  3. Orthostatic hypotension was significantly improved with IV fluids. Dizziness was resolved after orthostatic hypotension was resolved. The patient's condition significantly improved. The patient's orthostatic blood pressures on October 25, systolic 254/27 while lying down and, while standing up, blood pressure was at 112/71.  The patient's condition clinically improved significantly. The decision was made to discharge him back to skilled nursing facility.  MEDICATIONS AT THE TIME OF DISCHARGE: Bactrim DS 1 tablet p.o. b.i.d. Aspirin, enteric-coated 325 mg p.o. once daily, milk of magnesia 30 mL p.o. every 24 hours as needed for constipation, multivitamin 1 tablet p.o. once daily, vitamin C 500 mg once daily, Tylenol 325 mg 2 tablets p.o. every 4 hours as needed for pain, Keppra 750 mg 1 tablet p.o. 2 times a day. The patient was instructed not to take Dilantin until evaluated by neurologist.   DIET: Low-fat, low-cholesterol, regular consistency.   ACTIVITY: As tolerated with the help of a walker.  Follow up with the primary physician in 1 to 2 weeks and follow up with neurology, Dr. Tamala Julian, in approximately 1 week.  LABORATORY DATA AND IMAGING STUDIES: Patient's Dilantin level at the time of admission was 39.5, at the time of discharge 20.8. WBC 3.2, hemoglobin 11.2, hematocrit 34.2, platelets are 207. BMP is normal except calcium of 7.6. Urinalysis has revealed Streptococcus agalactiae group B greater than 100,000 colonies. The patient was counseled to quit smoking during the  hospital course. The patient was transferred to an assisted-living facility under stable condition on October 25.  TOTAL TIME SPENT ON DISCHARGE: 45  minutes.   ____________________________ Nicholes Mango, MD ag:jh D: 07/02/2014 22:43:14 ET T: 07/03/2014 05:58:00 ET JOB#: [id number]  cc: Nicholes Mango, MD, <Dictator> Dr. Tamala Julian, Neurology Youlanda Roys. Lovie Macadamia, MD   Nicholes Mango MD ELECTRONICALLY SIGNED 07/13/2014 21:07

## 2014-12-26 NOTE — Consult Note (Signed)
Referring Physician:  Vivien Presto :   Primary Care Physician:  Vivien Presto : [facility], [address] [facility], [place], [place], [place] [address], [place] [phone]  Reason for Consult: Admit Date: 10-Feb-2014  Chief Complaint: unresponsive  Reason for Consult: unresponsive   History of Present Illness: History of Present Illness:   64 yo RHD M presents to [facility] after being found down by family.  Pt was immediately intubated for airway protection in the ER where it was noted he had some twitching behavior that was concerning for a seizure.  Pt had sedation started which stopped his jerking activity which has not been well classified.    ROS:  Review of Systems   unobtainable secondary to mental status  Past Medical/Surgical Hx:  Cancer Pyriform Sinus:   ETOH Abuse:   Seizure Disorder:   Left CVA:   Past Medical/ Surgical Hx:  Past Medical History reviewed by me as above   Past Surgical History reviewed by me as above   Home Medications: Medication Instructions Last Modified Date/Time  Ativan 0.5 mg tablet 1 tab(s) orally 3 times a day x 3 days, As Needed 10-Jun-15 09:16   Allergies:  No Known Allergies:   Allergies:  Allergies NKDA   Social/Family History: Employment Status: unemployed  Lives With: children  Living Arrangements: apartment  Social History: EtOH abuse, + tob, no illicits per chart  Family History: no seizure, no stroke   Vital Signs: **Vital Signs.:   10-Jun-15 16:43  Pulse Pulse 72  Respirations Respirations 16  Systolic BP Systolic BP 96  Diastolic BP (mmHg) Diastolic BP (mmHg) 77  Mean BP 83  Pulse Ox % Pulse Ox % 100  Pulse Ox Activity Level  At rest  Oxygen Delivery Ventilator Assisted   Physical Exam: General: moderate distress, poorly kept, nl weight  HEENT: normocephalic, sclera nonicteric, oropharynx clear  Neck: supple, no JVD, no bruits  Chest: CTA B, no wheezing, decent movement on  ventilator  Cardiac: RRR, no murmurs, no edema, 2+ pulses  Extremities: no C/C/E, FROM   Neurologic Exam: Mental Status: intubated, sedated, does not open or follow, GCS 5T  Cranial Nerves: pupils pinpoint B and NR, weak corneals B, intact dolls, good cough  Motor Exam: weak flexion in B UE to pain, nl tone  Deep Tendon Reflexes: 1+/4 B, R babinski, L downgoing  Sensory Exam: no grimace to pain   Lab Results: Hepatic:  10-Jun-15 08:27   Bilirubin, Total  1.8  Alkaline Phosphatase  129 (45-117 NOTE: New Reference Range 07/25/13)  SGPT (ALT) 15  SGOT (AST) 33  Total Protein, Serum 6.6  Albumin, Serum  2.8  TDMs:  10-Jun-15 08:27   Dilantin, Serum  < 0.4 (Result(s) reported on 11 Feb 2014 at 11:40AM.)  Valproic Acid, Serum  < 3 (50-100 POTENTIALLY TOXIC:  > 200 mcg/mL)  Routine Chem:  10-Jun-15 08:27   Glucose, Serum  100  BUN 11  Creatinine (comp)  1.44  Sodium, Serum  129  Potassium, Serum 3.5  Chloride, Serum  92  CO2, Serum 29  Calcium (Total), Serum  8.3  Osmolality (calc) 258  eGFR (African American)  59  eGFR (Non-African American)  51 (eGFR values <72m/min/1.73 m2 may be an indication of chronic kidney disease (CKD). Calculated eGFR is useful in patients with stable renal function. The eGFR calculation will not be reliable in acutely ill patients when serum creatinine is changing rapidly. It is not useful in  patients on dialysis.  The eGFR calculation may not be applicable to patients at the low and high extremes of body sizes, pregnant women, and vegetarians.)  Anion Gap 8  Ethanol, S. < 3  Ethanol % (comp) < 0.003 (Result(s) reported on 11 Feb 2014 at 09:03AM.)  Urine Drugs:  09-UEA-54 09:81   Tricyclic Antidepressant, Ur Qual (comp) NEGATIVE (Result(s) reported on 11 Feb 2014 at 10:32AM.)  Amphetamines, Urine Qual. NEGATIVE  MDMA, Urine Qual. NEGATIVE  Cocaine Metabolite, Urine Qual. NEGATIVE  Opiate, Urine qual NEGATIVE  Phencyclidine, Urine Qual.  NEGATIVE  Cannabinoid, Urine Qual. NEGATIVE  Barbiturates, Urine Qual. NEGATIVE  Benzodiazepine, Urine Qual. POSITIVE (----------------- The URINE DRUG SCREEN provides only a preliminary, unconfirmed analytical test result and should not be used for non-medical  purposes.  Clinical consideration and professional judgment should be  applied to any positive drug screen result due to possible interfering substances.  A more specific alternate chemical method must be used in order to obtain a confirmed analytical result.  Gas chromatography/mass spectrometry (GC/MS) is the preferred confirmatory method.)  Methadone, Urine Qual. NEGATIVE  Cardiac:  10-Jun-15 15:26   Troponin I < 0.02 (0.00-0.05 0.05 ng/mL or less: NEGATIVE  Repeat testing in 3-6 hrs  if clinically indicated. >0.05 ng/mL: POTENTIAL  MYOCARDIAL INJURY. Repeat  testing in 3-6 hrs if  clinically indicated. NOTE: An increase or decrease  of 30% or more on serial  testing suggests a  clinically important change)  CK, Total 74 (39-308 NOTE: NEW REFERENCE RANGE  10/06/2013)  CPK-MB, Serum 1.6 (Result(s) reported on 11 Feb 2014 at 04:34PM.)  Routine UA:  10-Jun-15 09:42   Color (UA) Amber  Clarity (UA) Cloudy  Glucose (UA) Negative  Bilirubin (UA) Negative  Ketones (UA) Negative  Specific Gravity (UA) 1.016  Blood (UA) 2+  pH (UA) 5.0  Protein (UA) 100 mg/dL  Nitrite (UA) Negative  Leukocyte Esterase (UA) 2+ (Result(s) reported on 11 Feb 2014 at 10:06AM.)  RBC (UA) 7 /HPF  WBC (UA) 421 /HPF  Bacteria (UA) 1+  Epithelial Cells (UA) 46 /HPF  WBC Clump (UA) PRESENT  Mucous (UA) PRESENT  Hyaline Cast (UA) 9 /LPF (Result(s) reported on 11 Feb 2014 at 10:06AM.)  Routine Coag:  10-Jun-15 08:27   Prothrombin 13.3  INR 1.0 (INR reference interval applies to patients on anticoagulant therapy. A single INR therapeutic range for coumarins is not optimal for all indications; however, the suggested range for most  indications is 2.0 - 3.0. Exceptions to the INR Reference Range may include: Prosthetic heart valves, acute myocardial infarction, prevention of myocardial infarction, and combinations of aspirin and anticoagulant. The need for a higher or lower target INR must be assessed individually. Reference: The Pharmacology and Management of the Vitamin K  antagonists: the seventh ACCP Conference on Antithrombotic and Thrombolytic Therapy. XBJYN.8295 Sept:126 (3suppl): [id number]. A HCT value >55% may artifactually increase the PT.  In one study,  the increase was an average of 25%. Reference:  "Effect on Routine and Special Coagulation Testing Values of Citrate Anticoagulant Adjustment in Patients with High HCT Values." American Journal of Clinical Pathology 2006;126:400-405.)  Activated PTT (APTT) 27.0 (A HCT value >55% may artifactually increase the APTT. In one study, the increase was an average of 19%. Reference: "Effect on Routine and Special Coagulation Testing Values of Citrate Anticoagulant Adjustment in Patients with High HCT Values." American Journal of Clinical Pathology 2006;126:400-405.)  Routine Hem:  10-Jun-15 08:27   WBC (CBC) 7.5  RBC (CBC)  3.19  Hemoglobin (CBC)  12.2  Hematocrit (CBC)  36.7  Platelet Count (CBC)  138 (Result(s) reported on 11 Feb 2014 at 09:01AM.)  MCV  115  MCH  38.1  MCHC 33.2  RDW  17.1   Radiology Results: CT:    10-Jun-15 08:54, CT Head Without Contrast  CT Head Without Contrast   REASON FOR EXAM:    ams, ?seizure  COMMENTS:       PROCEDURE: CT  - CT HEAD WITHOUT CONTRAST  - Feb 11 2014  8:54AM     CLINICAL DATA:  Unresponsive; questionable seizure activity.    EXAM:  CT HEAD WITHOUT CONTRAST    TECHNIQUE:  Contiguous axial images were obtained from the base of the skull  through the vertex without intravenous contrast.    COMPARISON:  Noncontrast CT scan of the brain of May 14, 2010  FINDINGS:  The ventricles are normal in size  and position. There is no  intracranial hemorrhage nor intracranial mass effect. There is no  acute ischemic change. There is decreased density in the deep white  matter of both cerebral hemispheres consistent with chronic small  vessel ischemia. There is a small amount of fluid within the a left  sphenoid sinus cell. There is likely retention cyst or polyp in a  right sphenoid sinus cell.     IMPRESSION:  There is no acute ischemic or hemorrhagic event nor other acute  abnormality of the brain. A small amount of fluid a left sphenoid  sinus cell likely reflects inflammation.    Electronically Signed    By: David  Martinique    On: 02/11/2014 08:57         Verified By: DAVID A. Martinique, M.D., MD   Impression/Recommendations: Recommendations:   prior notes reviewed by me  reviewed by me d/w Dr. Mortimer Fries   Possible seizure-  pt had some jerking activity that was not witnessed by me, this could be epileptic vs. myoclonus;  pt has prior history and it is unclear if these are EtOH related or not, he was not taking any medications prior Encephalopathy-  this is severe and potentiated by medication;  etiology could be liver dysfunction, nutrition, infection, or even post-ictal EtOH abuse-  continues and pt has not tried to stop continue dilantin 178m TID add Keppra 5016mBID EEG today treat underlying infections keep Mg > 2, Ca > 8 and Na > 130 agree with banana bag for thiamine replacement place on CIWA protocol will follow  Electronic Signatures: SmJamison NeighborMD)  (Signed 10-Jun-15 17:07)  Authored: REFERRING PHYSICIAN, Primary Care Physician, Consult, History of Present Illness, Review of Systems, PAST MEDICAL/SURGICAL HISTORY, HOME MEDICATIONS, ALLERGIES, Social/Family History, NURSING VITAL SIGNS, Physical Exam-, LAB RESULTS, RADIOLOGY RESULTS, Recommendations   Last Updated: 10-Jun-15 17:07 by SmJamison NeighborMD)

## 2014-12-26 NOTE — Consult Note (Signed)
Brief Consult Note: Diagnosis: mood disorder due to general medical condition.   Patient was seen by consultant.   Consult note dictated.   Orders entered.   Discussed with Attending MD.   Comments: Psychiatry: Patient seen and chart reviewed and note dictated. Patient not on IVC and not psychotic and denies any intent to hurt self or anyone. Will add depakote 500mg  qhs and suggest discharge home.case discussed with Dr Haze Boyden.  Electronic Signatures: Gonzella Lex (MD)  (Signed 11-Dec-15 15:00)  Authored: Brief Consult Note   Last Updated: 11-Dec-15 15:00 by Gonzella Lex (MD)
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
## 2014-12-26 NOTE — Consult Note (Signed)
Psychiatry: Patient seen. Calm behavior. Alert and oriented. No new complaint. Denies any HI or SI and denies fighting but still seems defensive and confused. not require inpatient psychiatric treatment. Patient should be discharged to nursing home. No change to treatment plan.  Electronic Signatures: Gonzella Lex (MD)  (Signed on 22-Jul-15 16:17)  Authored  Last Updated: 22-Jul-15 16:17 by Gonzella Lex (MD)

## 2014-12-26 NOTE — H&P (Signed)
PATIENT NAME:  Calvin [name], Calvin [name] MR#:  [id number] DATE OF BIRTH:  06-Aug-1951  DATE OF ADMISSION:  06/25/2014  PRIMARY CARE PHYSICIAN: Dr. Lovie Macadamia.    PRIMARY ONCOLOGIST: Dr. Ma Hillock.    REFERRING EMERGENCY ROOM PHYSICIAN: Dr. Jimmye Norman.   CHIEF COMPLAINT:  Weakness and dizziness.   HISTORY OF PRESENT ILLNESS: A 64 year old male who has a past medical history of seizure disorder, anemia, stroke, hypertension, thrombocytopenia, sigmoid adenocarcinoma, right pyriform sinus squamous cell cancers and Clostridium difficile in the past. He lives in assisted living facility because of all of the multiple medical issues. Says that for the last 2 weeks he has been noticing increased frequency of urination, he is waking up almost 4-5 times at night to go to the bathroom and gradually started feeling weak. Today he was feeling extremely dizzy on trying to stand up and was feeling as if he would fall down, so finally he was sent to Emergency Room for further evaluation. In the ER he was found having orthostatic drop in his blood pressure and rise in his pulse rate and his urinalysis was positive, but white cell count was normal. On further questioning the patient denies any fever or chills and denies any burning in the urination.   REVIEW OF SYSTEMS:   CONSTITUTIONAL: Negative for fever, fatigue. Positive for generalized weakness and dizziness. No weight loss or weight gain.  EYES: No blurring, double vision, discharge, or redness.  EARS, NOSE, THROAT: No tinnitus, ear pain, or hearing loss.  RESPIRATORY: No cough, wheezing, hemoptysis, shortness of breath.  CARDIOVASCULAR: No chest pain, orthopnea, edema, arrhythmia, palpitation.  GASTROINTESTINAL: No nausea, vomiting, diarrhea, melena, abdominal pain.  GENITOURINARY: No dysuria, hematuria, but the patient having increased frequency.  ENDOCRINE: No heat or cold intolerance. No excessive sweating.  SKIN: No acne, rashes, or lesions.  MUSCULOSKELETAL: No pain  or swelling in the joints.   NEUROLOGIC:  No numbness, weakness, tremor, or vertigo, but has generalized dizziness on standing up.  PSYCHIATRIC: No anxiety, insomnia, bipolar disorder.   PAST MEDICAL HISTORY:  1. 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  2. Macrocytic anemia.  3. Stroke with right-sided weakness.  4. Hypertension.  5. Chronic thrombocytopenia.  6. Sigmoid adenocarcinoma status post resection.  7. Right hip surgery.  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  9. Escherichia coli sepsis in 2001.  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  11. History of Clostridium difficile in the past   FAMILY HISTORY: Mother with kidney problems.   SOCIAL HISTORY: He is a smoker, smokes half pack of cigarettes every day. He was a drinker in the past, but for the past 3-4 months did not drink as he lives in a facility. Denies doing any drugs.   MEDICATIONS CURRENTLY:  1. Acetaminophen 325 mg tablet 2 tablets every 4 hours as needed for pain.  2. Aspirin 325 mg delayed-release once a day.  3. Keppra 500 mg oral tablet 2 times a day.  4. Milk of magnesia 8% oral suspension 30 mL oral every 24 hours as needed for constipation.  5. Phenytoin extended-release 100 mg oral tablet 4 capsules once a day.  6. Vitamin C 500 mg oral tablet once a day.    PHYSICAL EXAMINATION:  VITAL SIGNS: In the ER temperature 97.4, pulse 70, respirations 18, blood pressure 106/68, and pulse oximetry is 100 on room air and orthostatic vitals were checked, on lying down pulse was 64 and blood pressure was 110/82, on sitting pulse 67 and blood pressure was 117/87, and on  standing up pulse is 91 and blood pressure is 87/72.   GENERAL:  The patient is fully alert and oriented with time, place, and person, and does not appear in any acute distress.  HEENT: Head and neck atraumatic. Conjunctivae pink. Oral mucosa dry.  NECK: Supple. No JVD.  RESPIRATORY: Bilateral equal and clear air entry.  CARDIOVASCULAR: S1, S2 present,  regular. No murmur.  ABDOMEN: Soft, nontender. Bowel sounds present. No organomegaly.  SKIN: No rashes.  LEGS: No edema.  NEUROLOGICAL: Power 4 out of 5 in the right side upper and lower extremity and 5 out of 5 in left side upper and lower extremities. Some shaking or giveaway present in between while he is moving his right-sided extremities upper and lower. Follows commands.  PSYCHIATRIC: Does not appear in any acute psychiatric illness.  JOINTS: No swelling or tenderness.   IMPORTANT LABORATORY RESULTS:  1.  Glucose 92, BUN is 9, creatinine 0.73, sodium 138, potassium 4.7, chloride 105, CO2 24, calcium is 8.5. 2.  Troponin less than 0.02.  3.  Dilantin 39.5.  4.  WBC 4.9, hemoglobin 12.2, platelet count is 232,000, MCV is 96.  5.  Urinalysis is grossly positive with cloudy urine and 223 WBCs with 15 RBCs and 3 + leukocyte esterase.   ASSESSMENT AND PLAN: A 64 year old male with a past medical history of seizure or stroke, hypertension, anemia, and cancer, presented to Emergency Room from assisted living facility because of feeling weak and dizzy and found having urinary tract infection and orthostatic hypotension.   1.  Urinary tract infection. He was started on Rocephin IV by ER physician. We will continue that and follow urine cultures.  2.  Orthostatic drop in the vitals. He has more than 10 drop in systolic blood pressure and more than 20 rise in his pulse rate on standing up from lying down position, so most likely this appears to be secondary to dehydration, happened as he had urinary increased frequency.  We will continue giving him IV fluids and treat his underlying urinary tract infection.  3.  Dizziness. As mentioned above most likely because of orthostatic hypotension. Continue IV fluid and get physical therapy evaluation.  4.  History of stroke. Was taking aspirin, just continue that.   5.  Elevated Dilantin level. Will hold Dilantin for now and continue IV fluids and monitor  tomorrow.   6.  Smoking. Smoking cessation counseling is done for 4 minutes and offered nicotine patch.  TOTAL TIME SPENT ON THIS ADMISSION: 50 minutes.     ____________________________ Ceasar Lund Anselm Jungling, MD vgv:bu D: 06/25/2014 15:22:48 ET T: 06/25/2014 15:43:13 ET JOB#: [id number]  cc: Ceasar Lund. Anselm Jungling, MD, <Dictator> Sandeep R. Ma Hillock, MD Youlanda Roys. Lovie Macadamia, MD Vaughan Basta MD ELECTRONICALLY SIGNED 07/05/2014 18:23

## 2014-12-27 NOTE — Consult Note (Signed)
Have discussed with patient again and also with his daughter [name]. She states he has not had any appts at [facility]. Have explained it is important that we get further workup and treatment planning for the neck mass once he recovers from recent hip surgery. Plan is to see him back at cancer center next week and make further appts.  Electronic Signatures: Jonn Shingles (MD)  (Signed on 01-Mar-13 23:14)  Authored  Last Updated: 01-Mar-13 23:14 by Jonn Shingles (MD)

## 2014-12-27 NOTE — Op Note (Signed)
PATIENT NAME:  Calvin [name], Calvin [name] MR#:  [id number] DATE OF BIRTH:  1951-06-22  DATE OF PROCEDURE:  11/09/2011  PREOPERATIVE DIAGNOSIS: Right parapharyngeal mass (PET positive).   POSTOPERATIVE DIAGNOSIS: Right parapharyngeal mass (PET positive).   PROCEDURES:  1. Rigid bronchoscopy.  2. Rigid direct laryngoscopy with biopsy.  3. Rigid esophagoscopy.   SURGEON: Janalee Dane, MD   DESCRIPTION OF PROCEDURE: The patient was placed in the supine position on the operating room table. After general endotracheal anesthesia had been induced, the patient was turned 90 degrees counterclockwise from anesthesia. A rigid bronchoscopy was performed removing the endotracheal tube. The trachea, carina, left mainstem, right mainstem, and takeoffs from the primary bronchi were examined. There were no abnormalities. Therefore, the patient was reintubated and rigid esophagoscopy was performed with the [place]. There were no masses or lesions distal to the cervical esophagus and the only mass was the right lateral posterior pharyngeal and piriform sinus mass. The Dedo laryngoscope was used to examine this. This was confined to the right posterolateral oropharynx and piriform sinus. Several cup forcep biopsies were taken. These were sent for pathology and frozen section analysis confirmed invasive squamous cell carcinoma. Temporary pledgets were placed soaked with local anesthesia. These were left in place for five minutes and then removed. No active bleeding was encountered. The patient was then returned to anesthesia, allowed to emerge from anesthesia in the operating room, and taken to the recovery room in stable condition. There were no complications. Estimated blood loss less than 10 mL.   ____________________________ J. Nadeen Landau, MD jmc:drc D: 11/09/2011 19:32:48 ET T: 11/10/2011 08:05:50 ET JOB#: [id number]  cc: Janalee Dane, MD, <Dictator> Nicholos Johns MD ELECTRONICALLY SIGNED  11/13/2011 8:08

## 2014-12-27 NOTE — H&P (Signed)
PATIENT NAME:  Calvin [name], DELORIA MR#:  [id number] DATE OF BIRTH:  04/25/1951  DATE OF ADMISSION:  10/31/2011  PRIMARY CARE PHYSICIAN: [facility]  ONCOLOGIST: Dr. Ma Hillock   HISTORY OF PRESENT ILLNESS: Patient is a 64 year old African American male with past medical history significant for history of sigmoid adenocarcinoma, history of right upper neck area mass as well as right lung area nodule of unknown significance presented to the hospital after fall. According to patient he was doing well up until today when he went to the bathroom to go to the bathroom. His right leg gave out and he landed on his left leg and was not able to stand as he was in such severe pain. He denies any loss of consciousness, however, he was noted to have left hip fracture and was brought to the Emergency Room for further evaluation. In the Emergency Room he was thought to have pathologic left hip fracture. For this reason patient was consulted by Dr. Mauri Pole while in the Emergency Room and hospitalist was requested to admit patient to the hospital.   PAST MEDICAL HISTORY:  1. History of polyclonal gammopathy on serum immunoelectrophoresis in recent past. 2. History of Escherichia coli sepsis in September 2011 due to urinary tract infection.  3. History of Clostridium difficile colitis in the past. 4. Rectal bleed after which he was noted to have T3 N0 sigmoid adenocarcinoma according to Dr. Beverly Gust note status post resection in January 2012. Pathology reported well-differentiated adenocarcinoma with mucinous differentiation arising in high-grade adenomatous dysplasia. Tumor invades in pericolonic soft tissue. No lymphovascular invasion. No perineural invasion. 15 lymph nodes were negative and margins were negative.  5. History of recently diagnosed right upper neck mass for which ENT biopsy was recommended as well as PET scan, however, patient failed to follow up with that. 6. Right lung nodularity, as well. PET scan was  recommended by Dr. Ma Hillock in January 2013 but no studies were performed. 7. History of alcohol abuse. 8. Macrocytic anemia of chronic disease as well as anemia due to alcohol abuse. 9. History of cerebrovascular accident with right-sided weakness. 10. Seizure disorder which was felt to be alcohol related. 11. Hypertension. 12. Chronic anemia. 13. Chronic thrombocytopenia.  14. Anxiety disorder. 15. Repair of laceration of jugular vein as a child.   MEDICATIONS: None.   ALLERGIES: No known drug allergies.   SOCIAL HISTORY: Drinks wine now. In the past he used to drink beer, vodka or whiskey. Drinks still every day. Drinks at least one bottle of wine daily. Last drink was last night. He admits of having some withdrawal symptoms such as feeling shakiness in the morning intermittently. Smokes at least one-half pack per day since 45 years ago. Lives by himself.   FAMILY HISTORY: Mother kidney problems. Not aware about other family members medical problems according to medical records.   REVIEW OF SYSTEMS: CONSTITUTIONAL: Positive for fatigue and weakness for the past few days, pains in left hip now after fall and fracture, weight loss of approximately 5 pounds since at least five days ago. He is not eating well. He is using walker to ambulate. He admits of some cough with whitish phlegm production. Admits of chest pain, especially on exertion intermittently as well as dyspnea on exertion. Some nausea as well as intermittent vomiting, abdominal pain in mid abdominal area as well as urination trouble such as dysuria as well as intermittent incontinence. Otherwise denies any fevers, chills, weight gain. EYES: In regards to eyes denies any blurry vision,  double vision, glaucoma, cataracts. ENT: Denies any tinnitus, allergies, epistaxis, sinus pain, dentures, difficulty swallowing. RESPIRATORY: Denies any wheezes, asthma, chronic obstructive pulmonary disease. CARDIOVASCULAR: Denies orthopnea, arrhythmias,  palpitations, or syncope. GASTROINTESTINAL: Denies any diarrhea, hematemesis, rectal bleeding, change in bowel habits. GENITOURINARY: Denies hematuria, frequency. ENDOCRINOLOGY:  Denies nocturia, thyroid problems, heat or cold intolerance or thirst. HEMATOLOGIC: Denies anemia, easy bruising, bleeding, swollen glands. SKIN: Denies any acne, rashes, lesions, change in moles. MUSCULOSKELETAL: Denies arthritis, cramps, swelling, gout. NEUROLOGIC: Denies any numbness, epilepsy, tremor. PSYCHIATRIC: Denies anxiety, insomnia, or depression.   PHYSICAL EXAMINATION: VITAL SIGNS: On arrival to the hospital: Temperature 96.9, pulse 115, respiration rate 20, blood pressure 119/76, saturation 99% on oxygen therapy.   GENERAL: This is a well-developed, well-nourished African American male in no significant distress, somewhat lying sideways on the stretcher.  HEENT: Pupils equal, reactive to light. Extraocular movements are intact. No icterus or conjunctivitis. He has normal hearing. No pharyngeal erythema. Mucosa is moist. Patient does have submandibular mass, nodularity, which seemed to be confluent lymph nodes noted in submandibular area on the left but no other masses were noted.   NECK: Neck did not reveal any masses. Supple, nontender. Thyroid not enlarged. No adenopathy. No JVD or carotid bruits bilaterally. Full range of motion.   LUNGS: Diminished breath sounds bilaterally. No significant rales, rhonchi, or wheezing. No labored inspirations, increased effort, dullness to percussion or overt respiratory distress.    CARDIOVASCULAR: S1, S2 appreciated. No murmurs, gallops, rubs noted. PMI not lateralized. Chest is nontender to palpation.   EXTREMITIES: 2+ pedal pulses. No lower extremity edema, calf tenderness, or cyanosis.   ABDOMEN: Soft. No significant tenderness. No hepatosplenomegaly or masses are noted.   RECTAL: Deferred.   MUSCULOSKELETAL: Able to move all extremities, however, he is sparing his  bilateral lower extremities. He has right-sided weakness. No cyanosis, degenerative joint disease. Not able to assess him for kyphosis. Gait not tested.   SKIN: Skin did not reveal any rashes, lesions, erythema, nodularity, induration. It was warm and dry to palpation.   LYMPH: No adenopathy in cervical region.   NEUROLOGIC: Cranial nerves grossly intact. Sensory is intact. No dysarthria, aphasia.   PSYCH: Patient is alert, oriented to time, person, place, cooperative. Memory is somewhat impaired. No significant confusion, agitation, depression noted.   LABORATORY, DIAGNOSTIC, AND RADIOLOGICAL DATA: BMP showed sodium 129, potassium 3.3, otherwise unremarkable. Magnesium level low at 1.4. Alcohol level 32, which would be 0.032%. Albumin level is low at 2.8, total bilirubin 1.2, alkaline phosphatase 181, AST 67, otherwise normal study. Troponin less than 0.02. White blood cell count is normal at 9.7, hemoglobin 14.9, platelets 114 with MCV high at 109. Coagulation panel was unremarkable. Urinalysis revealed amber hazy urine, negative for glucose, 1+ bilirubin, negative for ketones, specific gravity 1.023, pH 7.0, negative for blood, 100 mg/dL protein, negative for nitrites, trace leukocyte esterase, 2 red blood cells, 7 white blood cells. No bacteria were seen and 1 epithelial cell was seen.   Pelvic AP only x-ray 10/31/2011 revealed probable pathologic fracture of left hip. Tibia and fibula on the left 0226/2013 showed no acute bony abnormalities. Femur left x-ray 10/31/2011 showed subcapital fracture of left femoral neck. CT was recommended. CT of left hip without contrast 10/31/2011 showed subcapital left femoral neck fracture. No definite pathologic lesion was appreciated. Previous CT of abdomen and pelvis with bone window dated 10/26/2010 showed no definite pathologic process in left hip according to radiologist. EKG: Sinus tachycardia at 110 beats per minute, left  axis deviation, no acute ST-T  changes. No significant change since prior EKG done in 05/2011.   ASSESSMENT AND PLAN:  1. Left hip fracture. Admit patient to medical floor. Patient was seen already by Dr. Mauri Pole who felt that patient does have very significant medical problems. He recommended to proceed with admission to medical service, however, he would follow patient as consultant and do surgery if cleared by medicine. Will be asking cardiologist as well as oncologist to see patient for further recommendations. Will get echocardiogram as well as treat him for possible chronic obstructive pulmonary disease and supplement his hypokalemia as well as hypomagnesemia. I discussed with patient his risks and patient wants to proceed to fix his left hip.  2. Chest pain on exertion, questionable stable angina. Will get echocardiogram. Will restart Cardizem orally and will get cardiologist involved and will likely proceed to Operating Room if echo is unremarkable and if cardiologist feels that it would be okay to get him to Operating Room.  3. Dyspnea on exertion, likely chronic obstructive pulmonary disease as patient does have long-standing history of tobacco abuse. Will get patient to take Xopenex inhaler every four hours.  4. Hypokalemia. Supplementing IV.  5. Hypomagnesemia. Supplementing IV.  6. Alcohol abuse with history of withdrawal. Will initiate CIWA scale when patient is actually withdrawing. His last alcohol drink was yesterday in the evening.  7. Tobacco abuse. Will initiate nicotine replacement therapy. This was discussed with patient for four minutes.   TIME SPENT: One hour.   ____________________________ Theodoro Grist, MD rv:cms D: 10/31/2011 18:38:03 ET T: 11/01/2011 05:34:19 ET JOB#: [id number]  cc: Theodoro Grist, MD, <Dictator> Tasley MD ELECTRONICALLY SIGNED 11/01/2011 14:32

## 2014-12-27 NOTE — Consult Note (Signed)
Reason for Visit: This 64 year old Unknown patient presents to the clinic for initial evaluation of  Head and neck cancer .   Referred by Dr. Ma Hillock.  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 In CT scan January 2012 showed an abnormal soft tissue density in the right side of the hypopharynx. He is Ms. Jeann[facility] Senior appointments will recently was admitted for hip fracture after a fall in the bathroom. He underwent a left hip hemiarthroplasty. While in [facility] was able to evaluate the patient at pain biopsy for a 3 x 3.5 cm lesion in the right lateral posterior for pharynx and pyriform sinus biopsy positive for squamous cell carcinoma. PET/CT scan was performed and this was the only area of avid uptake with no evidence of nodes seen. Patients been seen in Dr. Ma Hillock is now referred to radiation oncology for consideration of treatment. This is multiple comorbidities and significance of the neck surgery patient would prefer radiation therapy with curative intent. He is having no significant dysphasia at this time. Still persistent right neck pain.  Past Hx:    ETOH Abuse:    Seizure Disorder:    Left CVA:   Past, Family and Social History:   Past Medical History positive     Cardiovascular hypertension    Neurological/Psychiatric anxiety; CVA; depression; EtOH abuse, seizure history    Past Surgical History Colon resection for T3 adenocarcinoma with no positive nodes.    Past Medical History Comments Chronic anemia and chronic thrombocytopenia.    Family History noncontributory    Social History positive    Social History Comments Greater than 50-pack-year smoking history, EtOH abuse history    Additional Past Medical and Surgical History Seen by himself today   Allergies:   No Known Allergies:   Home Meds:  Home Medications: Medication Instructions Status  Megace 40 mg/mL oral suspension 10 milliliter(s) orally once a day Active  oxybutynin 5 mg oral tablet 1 tab(s) orally once a day Active  Norco 5 mg-325 mg oral tablet 1 tab(s) orally every 6 hours, As Needed- for Pain  Active  heparin 5000 units/mL injectable solution  injectable every 8 hours Active  Colace 100 mg oral capsule 1 cap(s) orally 2 times a day Active  Combivent 18 mcg-103 mcg-/inh inhalation aerosol 2 puff(s) inhaled every 6 hours, As Needed- for Pain , for Shortness of Breath  Active  magnesium 200 mg 2 tab(s) orally once a day Active  Colace 100 mg oral capsule 1 cap(s) orally 2 times a day Active  MiraLax 1 dose(s) orally once a day Active   Review of Systems:   Performance Status (ECOG) 1    Skin negative    Breast negative    Ophthalmologic negative    ENMT see HPI    Respiratory and Thorax negative  Cardiovascular see HPI    Gastrointestinal see HPI    Genitourinary negative    Musculoskeletal negative    Neurological see HPI    Psychiatric see HPI    Hematology/Lymphatics see HPI    Endocrine negative    Allergic/Immunologic negative   Nursing Notes:  Nursing Vital Signs and Chemo Nursing Nursing Notes: *CC Vital Signs Flowsheet:   12-Mar-13 10:56   Temp Temperature 97.9   Pulse Pulse 102   Respirations Respirations 20   SBP SBP 166   DBP  DBP 68   Height (cm) centimeters 180.3   Physical Exam:  General/Skin/HEENT:   General normal    Skin normal    Eyes normal    Additional PE Well-developed thin male in NAD wheelchair-bound. Oral cavity shows teeth in poor state of repair needing multiple teeth extractions. Indirect mirror examination shows fullness in the right hypopharynx. Vallecula and base of tongue within normal limits. Neck is clear without evidence of some digastric cervical or supraclavicular adenopathy. Lungs are clear to A&P cardiac examination shows regular rate and rhythm.   Breasts/Resp/CV/GI/GU:   Respiratory and Thorax normal    Cardiovascular normal    Gastrointestinal normal    Genitourinary normal   MS/Neuro/Psych/Lymph:   Musculoskeletal normal    Neurological normal    Lymphatics normal   Other Results:  Radiology Results:  Nuclear Med:    05-Mar-13 10:12, PET/CT Scan Head/Neck CA Diagnosis   PET/CT Scan Head/Neck CA Diagnosis    REASON FOR EXAM:    mass in right neck, smoker  COMMENTS:       PROCEDURE: PET - PET/CT DX HEAD/NECK CA  - Nov 07 2011 10:12AM     RESULT:     Total Body PET was performed in conjunction with a nonattenuated CT   status post right wrist injection of 12.14 mCi of F18-/FDG. The patient's   fasting blood glucose was measured at 86 mg/dl.    Incidental note is made of an area likely representing infiltration along   the distal right arm.    Appropriate activity is identified in the region of the brain, heart,     liver, spleen, kidneys and urinary bladder.    An intense area of hypermetabolic activity projects within the right   parapharyngeal region demonstrating an SUV of 10.41. This correlates with   an area of increased soft tissue attenuation on the CT consistent with   the patient's reported history of right neck neoplastic disease. This   finding extends into the prevertebral space. A second area of   hypermetabolic activity projects within  the adenoidal region on the right   demonstrating an SUV of 6.4. There is increased soft tissue prominence in   this area. The area of hypermetabolic activity involving the adenoid on   the right appears to extend along the parapharyngeal soft tissues to the   area of intense previously described hypermetabolic activity.    A focal region of hypermetabolic activity projects along the skin region   overlying the lateral gluteus maximum region on the right. This may     represent an area of contamination or possibly a region of prior trauma.   No definite soft tissue mass or nodule is appreciated and this area   demonstrates an SUV of 2.51.    No further regions of abnormal hypermetabolic activity are identified.    IMPRESSION:      1. Area of intense hypermetabolic activity consistent with a soft tissue  mass in the right parapharyngeal space. This finding extends to the level   of the adenoids on the right.   2. Abnormal focus of hypermetabolic activity which appears to overly the   skin region in the area of the lateral gluteus maximus musculature on the   right as described above. This may represent contamination or possibly   from prior trauma.  Thank you for this opportunity to contribute to the care of your patient.           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 Would also treat his neck nodes up to 5400 cGy using IMRT dose painting technique. I have scheduled him to see a dentist in town for teeth extraction prior to beginning therapy. I have discussed the case with Dr. Ma Hillock who will be treating with concurrent platinum based chemotherapy as a radiation sensitizer. I will see him back  after completion of his teeth extraction and start treatments and CT simulation at that point. Risks and benefits of treatment including oral mucositis, dysphagia, and xerostomia were all reviewed with the patient and he seems to comprehend my treatment plan well.  I would like to take this opportunity to thank you for allowing me to continue to participate in this patient's care.  CC Referral:   cc: Dr. Nadeen Landau   Electronic Signatures: Baruch Gouty, Roda Shutters (MD)  (Signed 12-Mar-13 15:59)  Authored: HPI, Diagnosis, Past Hx, PFSH, Allergies, Home Meds, ROS, Nursing Notes, Physical Exam, Other Results, Encounter Assessment and Plan, CC Referring Physician   Last Updated: 12-Mar-13 15:59 by Armstead Peaks (MD)

## 2014-12-27 NOTE — Consult Note (Signed)
PATIENT NAME:  Calvin [name], Calvin [name] MR#:  [id number] DATE OF BIRTH:  1950-09-08  DATE OF CONSULTATION:  11/02/2011  REFERRING PHYSICIAN:  Theodoro Grist, MD CONSULTING PHYSICIAN:  Calvin Patella R. Ma Hillock, MD  REASON FOR CONSULTATION: Left hip fracture, history of right neck mass. Came to see the patient yesterday afternoon/evening and he was in surgery, the patient therefore being seen today.   HISTORY OF PRESENT ILLNESS: The patient is a 64 year old African American gentleman with past medical history of alcohol abuse, chronic smoking, hypertension, chronic anemia and chronic thrombocytopenia, anxiety disorder, history of cerebrovascular accident with right-sided weakness, seizure disorder, likely alcohol related, repair of laceration to jugular vein as a child, T3N0 sigmoid adenocarcinoma status post resection January 2012 with 15 lymph nodes negative who was recently found to have right upper neck mass after he has had persistent right upper neck pain for the last two months or so. CT scan of the neck on 01/21 showed abnormal soft tissue density on the right side in the hypopharynx beginning about the level of the epiglottis and extending to just above the level of the larynx along with thickening of vocal cords and surrounding soft tissues raising concern for malignancy. The patient had been scheduled to visit with ENT for biopsy and also for PET scan. He reportedly did not go for either of these appointments, and his daughter, Calvin [name], had called the [facility] stating that they had canceled his appointments and were going to take him to [facility] for further evaluation. The patient has now been admitted to the hospital on 02/26 after he had a fall in the bathroom when his right leg gave out and he landed on his left leg and x-ray showed subcapital left hip fracture. The patient currently is postoperative day one. Left hip hemiarthroplasty, per operative report, bone looked okay with no gross  disease. The patient today is resting, awake, answers questions appropriately. Denies any acute pain issues in the right upper neck area, eating is steady. Still weak.   PAST MEDICAL HISTORY/PAST SURGICAL HISTORY: As in history of present illness breast.   FAMILY HISTORY: Noncontributory.   SOCIAL HISTORY: History of chronic smoking and alcohol usage. Denies recreational drug usage.   ALLERGIES: No known drug allergies.   HOME MEDICATIONS: None on a regular basis.   REVIEW OF SYSTEMS: CONSTITUTIONAL: As in history of present illness. No fever or chills or night sweats. HEENT: No headaches or dizziness. No epistaxis. No ear pain. CARDIAC: No angina, palpitation, orthopnea, or paroxysmal nocturnal dyspnea. LUNGS: Has chronic dyspnea on exertion, denies any new cough, chest pain or hemoptysis. GASTROINTESTINAL: No nausea, vomiting, or diarrhea. No blood in stools or melena. GENITOURINARY: No dysuria or hematuria. MUSCULOSKELETAL: Denies new bone pains. Has hip pain after surgery yesterday. NEUROLOGIC: No new focal weakness, has prior history of cerebrovascular accident with right-sided weakness. No seizures or loss of consciousness. HEMATOLOGIC: Denies bleeding symptoms. SKIN: No new rashes or pruritus.   PHYSICAL EXAMINATION:  GENERAL: The patient is weak, mildly lethargic, but otherwise awake and oriented and converses appropriately. Poorly nourished individual. No icterus. Mild pallor.   VITAL SIGNS: Temperature 97.6, heart rate 90, respirations 19, blood pressure 96/61, oxygen saturation 100% on 1.5 liters.   HEENT: Normocephalic, atraumatic. Extraocular movements intact. Sclerae are clear. No oral thrush or obvious lesions.   NECK: Supple, firmness palpable in the right lateral neck with no definite masses.   CARDIOVASCULAR: S1, S2, regular rate and rhythm.   LUNGS: Bilateral decreased air  movement in general, more so in bases, no rhonchi.   ABDOMEN: Soft, nontender.   EXTREMITIES: No  edema.   NEUROLOGIC: The patient is mildly lethargic, otherwise, oriented x3. Cranial nerves are intact. Moves all extremities spontaneously.   LABORATORY, DIAGNOSTIC, AND RADIOLOGICAL DATA: WBC 7,000, hemoglobin 12, and platelets 97, ANC 4,800, creatinine 0.85, potassium 3.9, calcium 7.7. Liver functions unremarkable except albumin low at 2.3 and AST elevated at 44.   IMPRESSION AND RECOMMENDATION: 64 year old gentleman with recently diagnosed soft tissue mass in the right upper neck area highly suspicious for malignancy, but has not kept his appointments to see ENT or for PET scan (reportedly his daughter also canceled his appointments and planned to take him to [facility]) who has been admitted with fall and left hip fracture. He is status post hemiarthroplasty done yesterday. I have discussed with the patient as to whether he would want continued work-up for possible head and neck cancer done here or [facility], he is unsure at this time. Also, given that he is postoperative, will need to wait for some recovery before we pursue any PET scan evaluation. Will also see if we can discuss with his family and if they want further work-up here, can try to get ENT to see him as soon as possible. No acute pain issues at this time. Will continue to follow.   Thank you for the referral. Please feel free to contact me if additional questions.   ____________________________ Calvin Bannister Ma Hillock, MD srp:ap D: 11/02/2011 14:55:20 ET T: 11/02/2011 15:14:20 ET JOB#: [id number]  cc: Calvin Zeidman R. Ma Hillock, MD, <Dictator> Calvin Heimlich MD ELECTRONICALLY SIGNED 11/02/2011 21:24

## 2014-12-27 NOTE — Op Note (Signed)
PATIENT NAME:  Calvin [name], Calvin [name] MR#:  [id number] DATE OF BIRTH:  06-08-1951  DATE OF PROCEDURE:  11/01/2011  PREOPERATIVE DIAGNOSIS: Displaced femoral neck fracture, left hip.   POSTOPERATIVE DIAGNOSIS: Displaced femoral neck fracture, left hip.   PROCEDURE: Cemented hemiarthroplasty, left hip.   SURGEON: Alysia Penna. Coal Nearhood, MD    ASSISTANT: None.   ANESTHESIA: General.   ESTIMATED BLOOD LOSS: 150 mL.  REPLACEMENT: 1600 mL of Crystalloid.   DRAINS: None.   COMPLICATIONS: None.   IMPLANTS USED: Stryker ODC #5 femoral component, #4 cement restrictor, +5 neck, 50 mm Unitrax head, antibiotic-impregnated cement.   COMPLICATIONS: None.   BRIEF CLINICAL NOTE AND PATHOLOGY: The patient suffered the above-mentioned fracture. He was admitted on the Medicine service because of multiple medical problems. He was cleared for surgery. He was brought to the operating room after risks and benefits were discussed in detail. At the time of the procedure, the patient's bone was of good quality. There was marked displacement of the fracture.   PROCEDURE: Preop antibiotics, adequate general anesthesia, right lateral decubitus position, all prominences well padded. Routine prepping and draping. Appropriate time-out was called. Routine posterior approach was made. Fascia opened in line with the incision. The piriformis was identified and tagged. It was reflected, capsule was excised in a T-shaped fashion after the Charnley retractor was placed.   The proximal femur was then delivered. The cookie-cutter was used. Tapered reaming progressed to size 6. Broaching performed, the size 5 broach filled the canal nicely. The neck cut had previously been made and calcar planing performed.   Attention was then turned to the acetabulum. The femoral head was removed and sizing was performed. The acetabulum was thoroughly inspected and irrigated. The femoral broach was removed. The canal was thoroughly irrigated. Cement  restrictor was placed. It was then dried with Neo-Synephrine soaked sponge followed by a dry sponge. The cement was mixed and placed distal to proximal and pressurized. The femoral component was then inserted maintaining appropriate anteversion. It seated very nicely.   Excess cement was removed while the cement was allowed to harden.   The head and neck were then placed after the cement had hardened and the wound was examined for extraneous cement. The incision was thoroughly irrigated. Hip was taken through a range of motion without any evidence of instability. The piriformis was repaired with #5 nonabsorbable suture. The fascia was closed with #2 quill. The sub-Q was closed with 0 quill. Skin was closed with staples. Soft sterile dressing was applied. Sponge and needle counts were reported as correct prior to and after wound closure. The patient was awakened and taken to the PAC-U having tolerated the procedure well.   ____________________________ Alysia Penna. Mauri Pole, MD jcc:drc D: 11/01/2011 15:53:19 ET T: 11/01/2011 16:24:24 ET JOB#: [id number]  cc: Alysia Penna. Mauri Pole, MD, <Dictator> Alysia Penna Arnetia Bronk MD ELECTRONICALLY SIGNED 11/05/2011 20:09

## 2014-12-27 NOTE — Consult Note (Signed)
ONCOLOGY followup - sitting in chair, states he is eating better. No neck or throat pain.no fever. No bleeding Sxs.- Alert and oriented, NAD           vitals - afebrile, stable           lungs - b/l diminished BS, no rhonchi           abd - soft, NT- Hb 9.5, plts 318K, Cr 76.32  64 year old gentleman with soft tissue mass in the right upper neck area highly suspicious for malignancy, PET scan shows intense uptake in right parapharyngeal area s/o malignancy. Awaiting ENT consult for ENT exam and biopsy to get tissue diagnosis. Patient explained about PET scan findings and films reviewed with him. He is also status post hip hemiarthroplasty and is planned for transfer to rehab. Will f/u after biopsy report is available and make further treatment planning. Patient agreeable to this plan.   Electronic Signatures: Jonn Shingles (MD)  (Signed on 06-Mar-13 19:40)  Authored  Last Updated: 06-Mar-13 19:40 by Jonn Shingles (MD)

## 2014-12-27 NOTE — Op Note (Signed)
PATIENT NAME:  Calvin [name], YAKUBOV MR#:  [id number] DATE OF BIRTH:  06/29/1951  DATE OF PROCEDURE:  01/05/2012  PREOPERATIVE DIAGNOSIS:  Squamous cell carcinoma of the oropharynx.   POSTOPERATIVE DIAGNOSIS: Squamous cell carcinoma of the oropharynx.   PROCEDURES:  1. Ultrasound guidance for vascular access to the brachial vein.  2. Fluoroscopic guidance for placement of catheter.  3. Insertion of peripherally inserted central venous catheter, right arm.  SURGEON: Katha Cabal, MD   ANESTHESIA: Local.   ESTIMATED BLOOD LOSS: Minimal.   INDICATION FOR PROCEDURE: Chemotherapy for squamous cell carcinoma.   DESCRIPTION OF PROCEDURE: The patient's right arm was sterilely prepped and draped, and a sterile surgical field was created. The brachial vein was accessed under direct ultrasound guidance without difficulty with a micropuncture needle and permanent image was recorded. 0.018 wire was then placed into the superior vena cava. Peel-away sheath was placed over the wire. A single lumen peripherally inserted central venous catheter was then placed over the wire and the wire and peel-away sheath were removed. The catheter tip was placed into the superior vena cava and was secured at the skin at 35 cm with a sterile dressing. The catheter withdrew blood well and flushed easily with heparinized saline. The patient tolerated procedure well.  ____________________________ Katha Cabal, MD ggs:drc D: 01/06/2012 14:09:23 ET T: 01/06/2012 14:15:17 ET JOB#: [id number]  cc: Katha Cabal, MD, <Dictator> Katha Cabal MD ELECTRONICALLY SIGNED 01/12/2012 7:53

## 2014-12-27 NOTE — Consult Note (Signed)
ONCOLOGY followup note - overall doing better. No neck or throat pain issues.no fever. No bleeding Sxs.- more alert and oriented, NAD           vitals - afebrile, 97% RA           lungs - b/l diminished BS           abd - soft, NT- Hb 10.6, Cr 31.19 64 year old gentleman with recently diagnosed soft tissue mass in the right upper neck area highly suspicious for malignancy, but has not kept his appointments to see ENT or for PET scan who was admitted with fall and left hip fracture. He is status post hemiarthroplasty. Patient continues to be in hospital, have discussed with the patient and he is agreeable to get PET scan done for head and neck Ca diagnosis, following which will consult ENT as indicated. No acute pain issues at this time. Will continue to follow  Electronic Signatures: Jonn Shingles (MD)  (Signed on 04-Mar-13 22:56)  Authored  Last Updated: 04-Mar-13 22:56 by Jonn Shingles (MD)

## 2014-12-27 NOTE — Discharge Summary (Signed)
PATIENT NAME:  Calvin [name], Calvin [name] MR#:  [id number] DATE OF BIRTH:  03/12/1951  DATE OF ADMISSION:  10/31/2011 DATE OF DISCHARGE:  11/10/2011  ADMITTING PHYSICIAN: Dr. Theodoro Grist  DISCHARGING PHYSICIAN: Dr. Gladstone Lighter  PRIMARY CARE PHYSICIAN: [facility]  PRIMARY ONCOLOGIST: Dr. Ma Hillock   CONSULTATIONS IN THE HOSPITAL:  1. Orthopedic consultation by Dr. Mauri Pole.  2. Cardiology consultation by Dr. Lujean Amel. 3. Oncology consultation by Dr. Ma Hillock. 4. ENT consultation by Dr. Nadeen Landau.   DISCHARGE DIAGNOSES:  1. Left hip fracture status post open reduction internal fixation postoperative day nine today.  2. Anemia postoperatively status post 3 units of packed red blood cells transfusion this admission.  3. Hypovolemic hypotension improving with fluids.  4. 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  5. Chronic obstructive pulmonary disease.  6. Tobacco use disorder.  7. Alcohol use.  8. Dysphagia and poor oral intake.  9. Urinary frequency. Constipation.  10. History of sigmoid adenocarcinoma. 11. 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  12. History of Clostridium difficile colitis in the past.  13. Escherichia coli sepsis in September 2011.   14. 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  15. Repair of laceration of jugular vein as a child.   DISCHARGE HOME MEDICATIONS:  1. Megace 40 mg/mL, 10 mL p.o. daily.   2. Oxybutynin 5 mg p.o. daily.  3. Norco 5/325, 1 tablet p.o. every six hours p.r.n.  4. Heparin 5000 units subcutaneous q.8 hours.  5. Colace 100 mg p.o. b.i.d.  6. Magnesium oxide 400 mg p.o. daily.  7. Combivent inhaler 2 puffs every six hours p.r.n.    DISCHARGE DIET: Regular diet with thickened liquids.   DISCHARGE ACTIVITY: As tolerated.    FOLLOW UP INSTRUCTIONS:  1. Follow up with ortho in one week.  2. Physical therapy.  3. Follow up with Dr. Ma Hillock in 3 to 4 days  and also radiation oncology.  4. Primary care physician follow up in two weeks.   LABORATORY, DIAGNOSTIC AND RADIOLOGICAL DATA: WBC 9.9, hemoglobin 9.5 after 3 units blood transfusion, hematocrit 28.3, platelet count 318.   Sodium 141, potassium 4.1, chloride 107, bicarbonate 22, BUN 9, creatinine 0.86, glucose 77, calcium 8.1. Blood cultures remained negative. Recent PET scan on 11/07/2011 showing intense hypermetabolic activity consistent with soft tissue mass in right parapharyngeal space extending to the level of adenoids on the right. There is also abnormal focus of hypermetabolic activity in the region of lateral gluteus maximus musculature on the right side, could be from prior trauma. Urinalysis negative for any infection.   Pelvis x-ray on admission showing pathologic fracture of left hip. Left femur x-ray showing subcapital fracture of left femoral neck. CT of the hip without contrast showing subcapital left femoral neck fracture. His hemoglobin dropped from 14 on admission to 12 after fluids and postoperatively went down as low as 7.9.   Echo Doppler showing LV normal size and function. No thrombus. LV systolic function greater than 55%. No wall motion abnormality. Right ventricular pressure slightly elevated at 30 to 40 mmHg.   BRIEF HOSPITAL COURSE: Mr. Arana is a 64 year old African American male with past medical history significant for sigmoid adenocarcinoma status post chemotherapy, recently diagnosed right upper neck mass supposed to get biopsy done, came to the hospital after he had a fall at home. His x-ray on admission shows left femoral neck fracture.  1. Left femoral neck fracture status post left hip  hemiarthroplasty on the left done by Dr. Mauri Pole on 11/01/2011. Today is postoperative day nine. Patient has been doing well with physical therapy ambulating and is on Norco for pain. He is doing well and he is on sub-Q heparin for deep vein thrombosis prophylaxis.  2. Postoperative  anemia and hypotension. His counts have dropped from hemoglobin baseline around 12 to 7.7 postoperatively. Total of 4 units of packed RBC transfusion but hemoglobin improving up to 9.5. His blood pressure was also on the low normal side. No prior history of hypotension. Cardizem and metoprolol were added in the hospital preoperatively for better heart rate control. Those medications were stopped and patient was given IV fluid boluses with improvement. Over the last 48 his blood pressure has been very stable.  3. Dysphagia, poor p.o. intake. Had a modified barium swallow done. Part of it is from mass effect from the right parapharyngeal mass which is head and neck cancer, seen by speech pathologist who recommended regular food with nectar thick liquids. If he has recurrent aspiration pneumonia then might need PEG tube but he will be having radiation for his head and neck cancer to so PEG tube might be a consideration long term.  4. Tobacco use disorder and chronic obstructive pulmonary disease. Was on nicotine patch while in the hospital and is stable without any wheezing so sending him on some Combivent inhaler p.r.n.  5. Head and neck cancer. Seen by Dr. Ma Hillock. Patient has cancelled his prior biopsy appointment with Dr. Carlis Abbott in the past. Dr. Carlis Abbott has generously agreed to see the patient in the hospital and patient had a biopsy done on 11/09/2011 and the preliminary report from Dr. Ainsley Spinner note is possible invasive squamous cell carcinoma. Spoke with Dr. Ma Hillock who will see the patient on 11/14/2011 and also arrange with radiation oncologist, Dr. Baruch Gouty, at the same time. His course has been otherwise uneventful in the hospital.   DISCHARGE CONDITION: Stable.   DISCHARGE DISPOSITION: To skilled nursing facility.   TOTAL TIME SPENT ON DISCHARGE: 45 minutes.  ____________________________ Gladstone Lighter, MD rk:cms D: 11/10/2011 13:09:14 ET T: 11/10/2011 13:39:53 ET  JOB#: [id number] cc: Los Ybanez Ma Hillock, MD Armstead Peaks, MD J. Nadeen Landau, M.D. and Alysia Penna. Mauri Pole, MD Gladstone Lighter MD ELECTRONICALLY SIGNED 11/14/2011 14:00

## 2014-12-27 NOTE — Consult Note (Signed)
PATIENT NAME:  Calvin [name], Calvin [name] MR#:  [id number] DATE OF BIRTH:  [birth year]  DATE OF CONSULTATION:  10/31/2011  REFERRING PHYSICIAN:  Theodoro Grist, MD   CONSULTING PHYSICIAN:  Alysia Penna. Kitty Cadavid, MD  REASON FOR CONSULTATION: A 64 year old male with a left hip fracture.   HISTORY OF PRESENT ILLNESS: The patient has a history of declining health with a diagnosis of sigmoid adenocarcinoma, a new right upper neck mass, as well as a lung nodule of concern which he has not sought follow up for. His right leg gave out, and he landed on his left leg, could not ambulate. He was brought to the Emergency Room where he was found to have a displaced femoral neck fracture. He denies any prior hip difficulty. He has multiple medical history.   PAST MEDICAL HISTORY: Notable for: 1. Adenocarcinoma. 2. Polyclonal gammopathy.  3. Escherichia coli sepsis September of 2011. 4. C. difficile colitis. 5. Rectal bleed with diagnosed adenocarcinoma. 6. Right upper neck mass with ENT biopsy recommended, which has not been performed. 7. Alcohol abuse. 8. Macrocytic anemia. 9. Seizure disorder, probable alcohol-related.  10. Hypertension.  11. Chronic anemia.  12. Chronic thrombocytopenia. 13. Anxiety disorder.   MEDICATIONS: None.   ALLERGIES: No known drug allergies.   SOCIAL HISTORY: He drinks wine. Previously he has consumed beer, vodka and whiskey. He drinks every day, at least one bottle of wine daily. He smokes at least 1/2 pack per day for the last 45 years. He lives alone.   FAMILY HISTORY: Notable for kidney problems, otherwise unknown.   REVIEW OF SYSTEMS: Notable for fatigue and weakness over the last few days, left hip pain after the fall, loss of approximately 5 pounds over the last couple of weeks. He is not eating well. He denies any shortness of breath, chest pain, GI, or GU symptoms. He denies any psychiatric, neurologic or musculoskeletal symptoms other than above.   PHYSICAL EXAMINATION:   GENERAL: A chronically ill-appearing male.   VITAL SIGNS: Blood pressure is 115/80, pulse is 112, respirations are 20.   HEENT: Pupils are equal, round, and reactive to light and accommodation. Extraocular movements are intact.   NECK: Neck is supple with no tenderness or masses.   LUNGS: Clear.   CARDIAC: Normal.   ABDOMEN: Benign.   EXTREMITIES: Upper extremities have overall good range of motion with normal neurovascular examination. Right lower extremity has good range of motion with normal neurovascular examination. Left lower extremity is shortened and externally rotated. Pain on attempted range of motion. No knee, foot or ankle pain. Neurovascular examination is intact.   LABORATORY, DIAGNOSTIC AND RADIOLOGICAL DATA:  X-ray reveal a displaced femoral neck fracture.  CT scan does not appear to show any metastatic disease.  Laboratory work is reviewed and is notable for increased alkaline phosphatase, hemoglobin 14.9, platelet count 114,000.   IMPRESSION/PLAN:  1. Displaced left femoral neck fracture with multiple medical issues: Once he is cleared for surgery, we will proceed with a hemiarthroplasty. The options, risks, and benefits were discussed with the patient, and he understands.   2. Alcohol abuse, history of alcohol withdrawal in the past.  3. Tobacco use.  4. Chest pain on exertion with possible stable angina.   ____________________________ Alysia Penna. Mauri Pole, MD jcc:cbb D: 11/01/2011 16:05:19 ET T: 11/01/2011 17:08:12 ET JOB#: [id number]  cc: Alysia Penna. Mauri Pole, MD, <Dictator> Alysia Penna Kaan Tosh MD ELECTRONICALLY SIGNED 11/05/2011 20:09

## 2014-12-27 NOTE — Consult Note (Signed)
Brief Consult Note: Diagnosis: SOB Abn Ekg CP.   Patient was seen by consultant.   Consult note dictated.   Recommend to proceed with surgery or procedure.   Orders entered.   Discussed with Attending MD.   Comments: IMP Pre-op Hip surgery PVD TIA SOB Falls Abn Ekg CAD ETOH abuse Smoking COPD HTN H/N Ca . PLAN He is an acceptable surgical risk Add low dose metoprolol 12.5 bid Consider ECHO post -op  CIWA protocol Advise to quit smoking Inhalers for COPD Continue Oncology input.  Electronic Signatures: Lujean Amel D (MD)  (Signed 27-Feb-13 10:32)  Authored: Brief Consult Note   Last Updated: 27-Feb-13 10:32 by Yolonda Kida (MD)

## 2015-01-03 NOTE — Consult Note (Signed)
Referring Physician:  Alric Seton   Primary Care Physician:  Alric Seton : [facility], [address], [place],  [address], [place] [phone]  Reason for Consult: Admit Date: 15-Oct-2014  Chief Complaint: fall and weakness  Reason for Consult: seizure   History of Present Illness: History of Present Illness:   64 yo RHD M who lives in a group home presents after fall and weakness.  Apparently pt has been feeling sick over the past few days and then had a witnessed seizures.  Pt does not remember episode but does not that his last seizure was like 6 months ago.  He denies headache but is having lots of L arm pain.  ROS:  General fatigue   HEENT no complaints   Lungs no complaints   Cardiac no complaints   GI no complaints   GU no complaints   Musculoskeletal back pain   Extremities no complaints   Skin no complaints   Neuro seizure  numbness/tingling   Past Medical/Surgical Hx:  Neck mass:   Cancer Pyriform Sinus:   ETOH Abuse:   Seizure Disorder:   Left CVA:   Past Medical/ Surgical Hx:  Past Medical History 1.  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  2.  History of macrocytic anemia.  3.  Stroke with chronic right-sided weakness.  4.  Hypertension.  5.  Chronic thrombocytopenia.  6.  Sigmoid adenocarcinoma status post resection.  7.  Status post right hip surgery.  8.  History of right pyriform sinus squamous cell cancer.  9.  Escherichia coli sepsis in 2001.  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  11. History of Clostridium difficile   Past Surgical History reviewed by me as above   Home Medications: Medication Instructions Last Modified Date/Time  cephalexin 500 mg oral capsule 1 cap(s) orally 3 times a day x 9 days 12-Feb-16 10:22  acetaminophen-HYDROcodone 325 mg-5 mg oral tablet 1 tab(s) orally every 6 hours, As Needed, moderate pain (4-6/10) , 12-Feb-16 10:22  nicotine 14 mg/24 hr transdermal  film, extended release 1 patch transdermal once a day 12-Feb-16 10:22  multivitamin 1 tab(s) orally once a day 11-Feb-16 08:32  Vitamin C 500 mg oral tablet 1 tab(s) orally once a day 11-Feb-16 08:32  acetaminophen 325 mg oral tablet 2 tab(s) orally every 4 hours, As Needed - for Pain 11-Feb-16 08:32  gabapentin 100 mg oral capsule 1 cap(s) orally 2 times a day 11-Feb-16 08:32  divalproex sodium 500 mg oral delayed release tablet 1 tab(s) orally once a day (at bedtime) 11-Feb-16 08:32  levETIRAcetam 750 mg oral tablet 1 tab(s) orally 2 times a day 11-Feb-16 08:32   Allergies:  No Known Allergies:   Social/Family History: Employment Status: unemployed  Lives With: alone  Living Arrangements: group home  Social History: + tob, heavy EtOH, no illicits  Family History: no seizures, no stroke   Vital Signs: **Vital Signs.:   12-Feb-16 07:52  Vital Signs Type Q 8hr  Temperature Temperature (F) 98.3  Celsius 36.8  Temperature Source oral  Pulse Pulse 82  Respirations Respirations 18  Systolic BP Systolic BP 131  Diastolic BP (mmHg) Diastolic BP (mmHg) 73  Mean BP 91  Pulse Ox % Pulse Ox % 100  Pulse Ox Activity Level  At rest  Oxygen Delivery Room Air/ 21 %   Physical Exam: General: normal weight, mild distress  HEENT: normocephalic, sclera nonicteric, oropharynx clear  Neck: supple, no JVD, no bruits  Chest: CTA B, no  wheezing, good movement  Cardiac: RRR, no murmurs, no edema, 2+ pulses  Extremities: no C/C/E, FROM   Neurologic Exam: Mental Status: alert and oriented x 2 not time, normal language, follows complex commands, mild dysarthria  Cranial Nerves: PERRLA, EOMI, nl VF, face symmetric, tongue midline, shoulder shrug equal  Motor Exam: moves B equally, nl tone  Deep Tendon Reflexes: 1+/4 B, mute plantars  Sensory Exam: localizes to pain and good temp B  Coordination: untestable   Lab Results: Hepatic:  11-Feb-16 06:30   Bilirubin, Total 0.4  Alkaline Phosphatase  86  SGPT (ALT)  13  SGOT (AST) 18  Total Protein, Serum 6.8  Albumin, Serum  2.8  TDMs:  11-Feb-16 06:30   Valproic Acid, Serum  47 (50-100 POTENTIALLY TOXIC:  > 200 mcg/mL)  Dilantin, Serum  < 0.4 (Result(s) reported on 15 Oct 2014 at 07:06AM.)  Routine Micro:  11-Feb-16 05:45   Micro Text Report BLOOD CULTURE   COMMENT                   NO GROWTH IN 18-24 HOURS   ANTIBIOTIC                       Culture Comment NO GROWTH IN 18-24 HOURS  Result(s) reported on 16 Oct 2014 at 06:00AM.  General Ref:  11-Feb-16 06:30   Acetaminophen, Serum  3 (10-30 POTENTIALLY TOXIC:  > 200 mcg/mL  > 50 mcg/mL at 12 hr after  ingestion  > 300 mcg/mL at 4 hr after  ingestion)  Salicylates, Serum 2.5 (0.0-2.8 Therapeutic 2.8-20.0 mg/dL Toxic >30.0 mg/dL)  Routine Chem:  11-Feb-16 06:30   Result Comment CBC - SMEAR SCANNED  - FIBRIN STRANDS SEEN ON SMEAR. THIS MAY  - AFFECT THE PLATELET COUNT. THE ACTUAL  - NUMERICAL COUNT MAY BE SOMEWHAT HIGHER  - THAN THE REPORTED VALUE.  Result(s) reported on 15 Oct 2014 at 08:08AM.  Ethanol, S. < 3 (Result(s) reported on 15 Oct 2014 at 07:06AM.)  Lipase  70 (Result(s) reported on 15 Oct 2014 at 07:06AM.)  12-Feb-16 04:57   Glucose, Serum 84  BUN 7  Creatinine (comp) 0.96  Sodium, Serum  135  Potassium, Serum  3.3  Chloride, Serum 104  CO2, Serum 25  Calcium (Total), Serum  7.8  Anion Gap  6  Osmolality (calc) 267  eGFR (African American) >60  eGFR (Non-African American) >60 (eGFR values <70m/min/1.73 m2 may be an indication of chronic kidney disease (CKD). Calculated eGFR, using the MRDR Study equation, is useful in  patients with stable renal function. The eGFR calculation will not be reliable in acutely ill patients when serum creatinine is changing rapidly. It is not useful in patients on dialysis. The eGFR calculation may not be applicable to patients at the low and high extremes of body sizes, pregnant women, and vegetarians.)   Urine Drugs:  [id number]  Tricyclic Antidepressant, Ur Qual (comp) NEGATIVE (Result(s) reported on 15 Oct 2014 at 08:53AM.)  Amphetamines, Urine Qual. NEGATIVE  MDMA, Urine Qual. NEGATIVE  Cocaine Metabolite, Urine Qual. NEGATIVE  Opiate, Urine qual NEGATIVE  Phencyclidine, Urine Qual. NEGATIVE  Cannabinoid, Urine Qual. NEGATIVE  Barbiturates, Urine Qual. NEGATIVE  Benzodiazepine, Urine Qual. NEGATIVE (----------------- The URINE DRUG SCREEN provides only a preliminary, unconfirmed analytical test result and should not be used for non-medical  purposes.  Clinical consideration and professional judgment should be  applied to any positive drug screen result due to possible interfering substances.  A more specific alternate chemical method must be used in order to obtain a confirmed analytical result.  Gas chromatography/mass spectrometry (GC/MS) is the preferred confirmatory method.)  Methadone, Urine Qual. NEGATIVE  Cardiac:  11-Feb-16 06:30   Troponin I < 0.02 (0.00-0.05 0.05 ng/mL or less: NEGATIVE  Repeat testing in 3-6 hrs  if clinically indicated. >0.05 ng/mL: POTENTIAL  MYOCARDIAL INJURY. Repeat  testing in 3-6 hrs if  clinically indicated. NOTE: An increase or decrease  of 30% or more on serial  testing suggests a  clinically important change)  Routine UA:  11-Feb-16 08:30   Color (UA) Yellow  Clarity (UA) Clear  Glucose (UA) Negative  Bilirubin (UA) Negative  Ketones (UA) Trace  Specific Gravity (UA) 1.018  Blood (UA) Negative  pH (UA) 6.0  Protein (UA) 30 mg/dL  Nitrite (UA) Positive  Leukocyte Esterase (UA) 3+ (Result(s) reported on 15 Oct 2014 at 09:00AM.)  RBC (UA) 3 /HPF  WBC (UA) 110 /HPF  Bacteria (UA) 1+  Epithelial Cells (UA) NONE SEEN  WBC Clump (UA) PRESENT (Result(s) reported on 15 Oct 2014 at 09:00AM.)  Routine Coag:  11-Feb-16 06:30   Prothrombin 12.8 (11.4-15.0 NOTE: New Reference Range  10/02/14)  INR 0.9 (INR reference interval  applies to patients on anticoagulant therapy. A single INR therapeutic range for coumarins is not optimal for all indications; however, the suggested range for most indications is 2.0 - 3.0. Exceptions to the INR Reference Range may include: Prosthetic heart valves, acute myocardial infarction, prevention of myocardial infarction, and combinations of aspirin and anticoagulant. The need for a higher or lower target INR must be assessed individually. Reference: The Pharmacology and Management of the Vitamin K  antagonists: the seventh ACCP Conference on Antithrombotic and Thrombolytic Therapy. NLZJQ.7341 Sept:126 (3suppl): [id number]. A HCT value >55% may artifactually increase the PT.  In one study,  the increase was an average of 25%. Reference:  "Effect on Routine and Special Coagulation Testing Values of Citrate Anticoagulant Adjustment in Patients with High HCT Values." American Journal of Clinical Pathology 2006;126:400-405.)  Routine Hem:  12-Feb-16 04:57   WBC (CBC) 9.9  RBC (CBC)  3.35  Hemoglobin (CBC)  10.5  Hematocrit (CBC)  31.8  Platelet Count (CBC)  146  MCV 95  MCH 31.4  MCHC 33.0  RDW 13.1  Neutrophil % 84.5  Lymphocyte % 10.8  Monocyte % 4.3  Eosinophil % 0.1  Basophil % 0.3  Neutrophil #  8.3  Lymphocyte # 1.1  Monocyte # 0.4  Eosinophil # 0.0  Basophil # 0.0 (Result(s) reported on 16 Oct 2014 at 06:10AM.)   Radiology Results: CT:    11-Feb-16 07:06, CT Head Without Contrast  CT Head Without Contrast   REASON FOR EXAM:    FALL, DIZZY  COMMENTS:       PROCEDURE: CT  - CT HEAD WITHOUT CONTRAST  - Oct 15 2014  7:06AM     CLINICAL DATA:  Head trauma secondary to a fall yesterday.    EXAM:  CT HEAD WITHOUT CONTRAST    TECHNIQUE:  Contiguous axial imageswere obtained from the base of the skull  through the vertex without intravenous contrast.    COMPARISON:  02/11/2014  FINDINGS:  No mass lesion. No midline shift. No acute hemorrhage or  hematoma.  No extra-axial fluid collections. No evidence of acute infarction.  There is diffuse and slight cerebral cortical atrophy, most  prominent in the temporal lobes. No ventricular dilatation.    No osseous abnormality.  The  scalp contusions.     IMPRESSION:  No acute abnormality.  Atrophy.      Electronically Signed    By: Lorriane Shire M.D.    On: 10/15/2014 07:32     Verified By: Larey Seat, M.D.,   Radiology Impression: Radiology Impression: CT of head shows moderate white matter changes   Impression/Recommendations: Recommendations:   prior notes reviewed by me reviewed by me   Seizure-  pt has a hx of seizures but just had another one while I was in the room that appeared to be simple partial;  these are likely provoked by UTI and subtherapeutic VPA levels. Stroke-  stable load depacon 527m IV now then increase to Depakote ER 7564mnightly increase Keppra to  1gm q12h IV but can switch to PO continue ASA and statin no need for EEG at this time keep Mg >2, Ca > 8 and Na > 130 no driving or operating heavy machinery x 6 months will follow  Electronic Signatures: SmJamison NeighborMD)  (Signed 12-Feb-16 14:57)  Authored: REFERRING PHYSICIAN, Primary Care Physician, Consult, History of Present Illness, Review of Systems, PAST MEDICAL/SURGICAL HISTORY, HOME MEDICATIONS, ALLERGIES, Social/Family History, NURSING VITAL SIGNS, Physical Exam-, LAB RESULTS, RADIOLOGY RESULTS, Recommendations   Last Updated: 12-Feb-16 14:57 by SmJamison NeighborMD)

## 2015-01-03 NOTE — Discharge Summary (Signed)
PATIENT NAME:  Calvin [name], Calvin [name] MR#:  [id number] DATE OF BIRTH:  [birth year]  DATE OF ADMISSION:  10/15/2014 DATE OF DISCHARGE:  10/29/2014  DISPOSITION: [facility].  DISCHARGE DIAGNOSES:   1.  Partial seizures.  2.  Left clavicular fracture.  3.  History of cerebrovascular accident.  4.  Ascending aortic aneurysm.  5.  Hypokalemia. 6.  Cystitis.   DISCHARGE MEDICATIONS: 1.  Simvastatin 10 mg daily.  2.  Seroquel 25 mg daily. 3.  Thiamine 100 mg p.o. daily. 4.  Ensure Plus 1 can p.o. t.i.d. 5.  Lidocaine topical patch every day to the left clavicle area. 6.  Lacosamide 200 mg q. 12 hours. 7.  Depakote sprinkles 125 mg each capsule. The patient needs to take 500 mg b.i.d. 8.  Keppra 1000 mg p.o. b.i.d.  9.  Neurontin 100 mg p.o. b.i.d.  10.  Tylenol 325 mg q. 4 hours p.r.n. as needed for pain.  11.  Percocet 5/325 one tablet every 6 hours as needed for moderate pain.   CONSULTATIONS: Physical therapy consult and also neurology consult.   HOSPITAL COURSE:  The patient's original discharge summary was done by Dr. Dustin Flock. This is an addendum.  1.  The patient is a 64 year old African American male who came in because of the fall and weakness, found to have UTI. The patient also noticed to have left clavicular swelling and CT showed left clavicle fracture. The patient initially admitted for fall, likely due to seizure. The patient's brain MRA did not show any acute CVA. The patient's EEG showed periodic lateralizing epileptic discharges that can lead to generalized seizures. The patient noted to have twitching movements of the face and some headache. Mainly the twitching happened on the left side, twitching extending to the arm. The patient was seen by Dr. Tamala Julian. He suggested giving Vimpat, Depakote, Keppra and IV thiamine 300 mg because of history of EtOH abuse. 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 The patient's twitching movements of the face  have subsided, did not have any headache, seen by neurology on a regular basis. The patient's medications have been adjusted and right now his seizures have completely resolved with no further twitching movements of the face. He is seizure free. The patient is right now on Keppra 1 gram q. 12 hours, Depakote 500 mg b.i.d., thiamine 100 mg daily, Vimpat 200 mg q. 12 hours. The patient needs to have magnesium more than 2, calcium more than 8, and sodium more than 130. He will need to follow up with [facility] in 4 to 6 weeks. The patient's recent labs showed sodium of 141, magnesium 1.9 and potassium of 4.2.  2.  History of cerebrovascular accident. He is on aspirin and statins.  3.  Deconditioning. He is going to [employer] today.  4.  Cystitis. The patient was treated.  5.  Left clavicle fracture. The patient is seen by Dr. Mack Guise and the patient does have a sling on the left arm. He is on Lidoderm patch and Percocet as needed. The patient will see Dr. Mack Guise in the office.  6.  Constipation, on and off . Use stool softeners as needed.  7.  History of ascending aortic aneurysm, 4.2 cm in greatest diameter. The patient is to follow up with vascular regarding semiannually imaging with CT angio and MRA. 8.  MRA of the brain showed no stroke, but seizures involving the right hemisphere, as per MRA of the brain results.  9.  The patient's urine culture did not show any growth. He had only cystitis.  10.  The patient has history of chronic thrombocytopenia and also history of right sigmoid adenocarcinoma status post resection, history of right hip surgery, history of right piriform sinus squamous cell carcinoma, history of polyclonal gammopathy, and history of C. diff before. The patient does have generalized weakness, seen by physical therapy, and he is going to [place] today.  11.  Falls because of seizure. The patient's medications have been adjusted. The patient is going to  [facility].  CONDITIONS: Stable.   PHYSICAL EXAMINATION: DISCHARGE VITAL SIGNS: Temperature 97.7, heart rate 62, blood pressure 118/71, sats  96% on room air. GENERAL: He is alert, awake, oriented. No facial twisting is observed.  HEART: S1, S2 regular.  LUNGS: Clear to auscultation.  ABDOMEN: Soft, nontender, nondistended. Bowel sounds present.  NEUROLOGIC: He is alert and oriented. He is happy to go.  TIME SPENT ON DISCHARGE PREPARATION: More than 30 minutes.  ____________________________ Epifanio Lesches, MD sk:sb D: 10/29/2014 10:49:43 ET T: 10/29/2014 11:18:41 ET JOB#: [id number]  cc: Epifanio Lesches, MD, <Dictator> Epifanio Lesches MD ELECTRONICALLY SIGNED 11/09/2014 14:58

## 2015-01-03 NOTE — H&P (Signed)
PATIENT NAME:  Calvin [name], Calvin [name] MR#:  [id number] DATE OF BIRTH:  Oct 10, 1950  DATE OF ADMISSION:  10/15/2014  PRIMARY CARE PROVIDER: Dr. Lovie Macadamia.  EMERGENCY DEPARTMENT REFERRING PHYSICIAN:  Dr. Archie Balboa.   CHIEF COMPLAINT: Weakness, fall.   HISTORY OF PRESENT ILLNESS: The patient is a 64 year old African American male with history of seizure disorder, anemia, previous CVA, hypertension, thrombocytopenia, sigmoid adenocarcinoma, right pyriform sinus squamous cell cancer, who currently resides in apparently an assisted living facility who has not been feeling well for the past few days.  He has been very weak and fatigued.  Today, apparently he was trying to ambulate and fell and started having pain in his left arm. He reports that he has noticed a swelling in the left arm in the clavicular region.  However, the chest x-ray did not show any abnormality. He denies any nausea, vomiting or diarrhea.   PAST MEDICAL HISTORY:  1.  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  2.  History of macrocytic anemia.  3.  Stroke with chronic right-sided weakness.  4.  Hypertension.  5.  Chronic thrombocytopenia.  6.  Sigmoid adenocarcinoma status post resection.  7.  Status post right hip surgery.  8.  History of right pyriform sinus squamous cell cancer.  9.  Escherichia coli sepsis in 2001.  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  11. History of Clostridium difficile.   FAMILY HISTORY: Mother with kidney problems.   SOCIAL HISTORY: Continues to smoke 1/2 pack per day, denies drinking now. No drug use.   MEDICATIONS: He is on vitamin C 500 mg 1 tab p.o. daily, multivitamin daily, milk of magnesia 30 mL once a day as needed, Keppra 750 at 1 tab p.o. b.i.d., gabapentin 100 mg 1 tab p.o. b.i.d., valproic acid 500 mg 1 tab p.o. at bedtime, aspirin 325 p.o. daily, acetaminophen 650 every 4 p.r.n.   REVIEW OF SYSTEMS:  CONSTITUTIONAL: Denies any fevers. Complains of fatigue, generalized weakness. No  weight loss. No weight gain.  EYES: No blurred vision, double vision. No discharge. No red redness.  EARS, NOSE AND THROAT:  No tinnitus. No ear pain. No hearing loss. No seasonal or year-round allergies.  RESPIRATORY: Denies any cough, wheezing, hemoptysis. No chronic obstructive pulmonary disease.  CARDIOVASCULAR: Denies any chest pain, orthopnea, edema or arrhythmia.  GASTROINTESTINAL: No nausea, vomiting, diarrhea. No melena. No abdominal pain.  GENITOURINARY: Denies any dysuria, complains of urinary incontinence and frequency.  ENDOCRINE: Denies heat or cold intolerance. No excessive sweating.  SKIN: No rash.  MUSCULOSKELETAL: Complains of swelling in the left clavicular region and pain there.  NEUROLOGICAL: No numbness, CVA, TIA.  PSYCHIATRIC: Denies any anxiety or depression.  JOINTS:  Complains of pain in the left clavicular region.   PHYSICAL EXAMINATION:  VITAL SIGNS: Temperature 98.4, pulse 96, respirations 19, blood pressure 125/89, O2 of 94%.  GENERAL: The patient is an [place] American male, well-developed, in no acute distress.  HEENT: Head atraumatic, normocephalic. Pupils equally round, reactive to light and accommodation. There is no conjunctival pallor. No scleral icterus.  Nasal exam shows no drainage or ulceration.  OROPHARYNX: Clear without any exudate.  NECK: Supple without any JVD. No thyromegaly.  CHEST:  Clear to auscultation bilaterally without any rales, rhonchi, wheezing.  CARDIOVASCULAR: Regular rate and rhythm. No murmurs, rubs, clicks, or gallops.  ABDOMEN: Soft, nontender, nondistended. Positive bowel sounds x 4.  SKIN: No rash.  MUSCULOSKELETAL:  Legs is at home with swelling. No erythema.  He has some swelling on the left clavicle region  where it meets the sternal bone.  NEUROLOGICAL: Awake, alert, oriented x 3. No focal deficits PSYCHIATRIC: Not anxious or depressed.   LABORATORY DATA:  Glucose 185, BUN 11, creatinine 1.13, sodium 139, potassium 3.5,  chloride 105, CO2 of 24, calcium 8.2. Lipase 70, LFTs are normal except albumin at 2.8, AST 18, ALT 13. Troponin less than 0.02.  Dilantin is less than 0.4.  Toxic urine drug screen is negative.  Admitting WBC count 18.8, hemoglobin 12.4, platelet count 139, INR 0.9. Urinalysis 3+, bacteria 1+.   CT scan of the head without contrast showed no acute abnormality, chronic atrophy. Chest x-ray, portable view with no acute abnormality.   ASSESSMENT AND PLAN: The patient is a 64 year old with a history of cerebrovascular accident presents with a fall, weakness noted to have urinary tract infection.  1.  Weakness and fall; likely due to urinary tract infection.  We will treat him with IV antibiotics, urine cultures, physical therapy evaluation and treatment.  2.  Left clavicular swelling.  We will get a CT scan of the chest to further evaluate seizure disorder. Continue home medications, which include Keppra, valproic acid, gabapentin.   3.  History of cerebrovascular accident. Continue aspirin as taking at home.  4.  Nicotine addiction. Smoking cessation done, 4 minutes spent. Strongly recommended to stop smoking, 4 minutes spent.  He will be started on a nicotine patch     TIME SPENT: 50 minutes.      ____________________________ Lafonda Mosses Posey Pronto, MD shp:DT D: 10/15/2014 12:54:58 ET T: 10/15/2014 13:19:29 ET JOB#: [id number]  cc: Enzley Kitchens H. Posey Pronto, MD, <Dictator> Alric Seton MD ELECTRONICALLY SIGNED 10/23/2014 15:57

## 2015-01-03 NOTE — Discharge Summary (Signed)
PATIENT NAME:  Calvin [name], Calvin [name] MR#:  [id number] DATE OF BIRTH:  03-Sep-1951  DATE OF ADMISSION:  10/15/2014 DATE OF DISCHARGE:  10/22/2014  Summary covers from 10/15/2014 to 10/22/2014.   ADMITTING DIAGNOSES: Weakness, fall.  INTERIM SUMMARY AND DISCHARGE DIAGNOSES:  1.  Weakness, fall likely due to a seizure.  2.  Seizure, persistent initially felt to be related to urinary tract infection. The patient's medications have continued being adjusted.  3.  Cystitis, status post treatment.  4.  Electrolyte imbalances, status post replacement.  5.  Left clavicular fracture. Dr. Leslye Peer spoke to Dr. Mack Guise. The patient will need a follow-up with Dr. Mack Guise in a one-week period.  6.  Aneurysm of the aorta noted on the CT scan. The patient will need outpatient vascular surgery follow-up.  7.  History of cerebrovascular accident.  8.  History of seizure disorder, alcohol related.  9.  History of macrocytic anemia.  10.  Hypertension.  11.  Chronic thrombocytopenia.  12.  History of sigmoid adenocarcinoma status post resection.  13.  Status post right hip surgery.  14.  History of right pyriform sinus squamous cell cancer.  15.  Escherichia coli sepsis.  16.  Polyclonal gammopathy.  17.  History of Clostridium difficile.   CONSULTANTS: Dr. Valora Corporal, Dr. Jaymes Graff. [place].   PERTINENT LABORATORY AND EVALUATIONS:  Admitting glucose 84, BUN 7, creatinine 0.96, sodium 135, potassium 3.3, chloride 104, CO2 of 25. Calcium 7.8, magnesium was 1.6.  His valproic acid on February 13 was 48, valproic acid level today 36. WBC 9.9, hemoglobin 10.5, platelet count 146,000. Urine culture, no growth.  MRI of the brain showed suspected mild restricted diffusion suggesting a recent seizure involving the right hemisphere. No definite acute cerebral infarct or intracranial mass lesion, advanced chronic changes in the brain noted.  EEG with evidence of epilepsy, as there is right occipital periodic  lateralizing epileptic discharges as well as generalized slowing.   HOSPITAL COURSE: Please refer to H and P done by the admitting physician. The patient is a 64 year old African American male who presented with a fall, weakness noted to have a urinary tract infection initially. The patient was admitted for these symptoms. He was also noticed to have left clavicular swelling and had a CT scan of the chest, which showed he had a clavicular fracture. The patient was admitted for further treatment for these issues.  1.  Fall, likely due to seizure activity. The patient continued to have seizure activity during hospitalization. He was seen by two neurologists.  He had an EEG.  His medications have been adjusted. Valproic acid is today increased. The patient continues to complain of feeling like he is going to fall on the floor even though he is sleeping in the bed.  He did have an MRI, which failed to show any CVA.  2.  Clavicular fracture. The patient will need to see orthopedics in one week with Dr. Mack Guise after discharge. The patient also was noted to have a descending aortic aneurysm for which he will need to see vascular surgery.  3.  Generalized weakness. The patient will need outpatient physical therapy.  4.  Electrolyte imbalances were replaced.   MEDICATIONS: At the time of interim summary: Tylenol 650 mg q.4 p.r.n. for pain, Norco 5/325 mg 1 to 2 tabs q.4 p.r.n. for pain, ascorbic acid 500 mg daily, multivitamin daily, nicotine patch 14 mg daily, Zofran 4 mg IV q.4 p.r.n., Robitussin 15 mL q.6 p.r.n., Lidoderm patch to affected area,  Colace 100  mg b.i.d., Vimpat 200 mg q.12 hours, aspirin 81 mg 1 tab p.o. daily, lorazepam 1 mg q.6  hours p.r.n., Dulcolax p.r.n., Keppra 1000 mg q.12 hours, MiraLax 17 grams daily, senna 2 tabs p.o. b.i.d., Depakote 750  one tab p.o. b.i.d., Seroquel 25 mg p.o. at bedtime.  TIME SPENT ON THIS INTERIM SUMMARY: Thirty-five minutes.     ____________________________ Lafonda Mosses Posey Pronto, MD shp:at D: 10/23/2014 15:23:00 ET T: 10/23/2014 17:06:55 ET JOB#: [id number]  cc: Leighla Chestnutt H. Posey Pronto, MD, <Dictator> Alric Seton MD ELECTRONICALLY SIGNED 10/31/2014 10:32

## 2015-08-06 IMAGING — CT CT HEAD WITHOUT CONTRAST
3 of 4 series · 17 of 30 positions shown, 18 images · non-contrast
Comparison: Noncontrast CT scan of the brain May 14, 2010

CLINICAL DATA: Unresponsive; questionable seizure activity.

EXAM:
CT HEAD WITHOUT CONTRAST
TECHNIQUE: Contiguous axial images were obtained from the base of the skull
through the vertex without intravenous contrast.

[Series 2: head bone · axial · 0.43mm/px · z∈[+1299,+1453]mm · 8 of 92 slices shown (1 of 2)]
[im 8/92  bone]
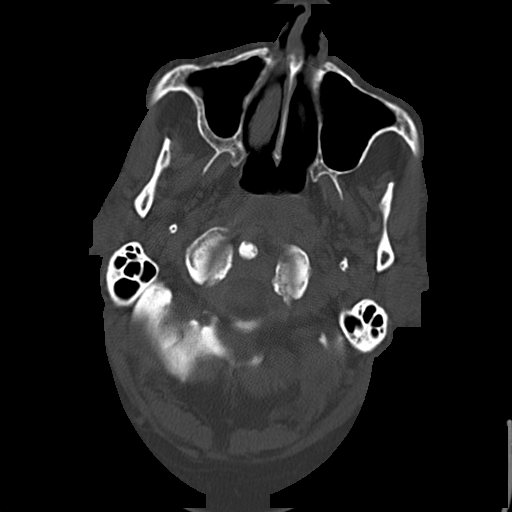
[im 22/92  bone]
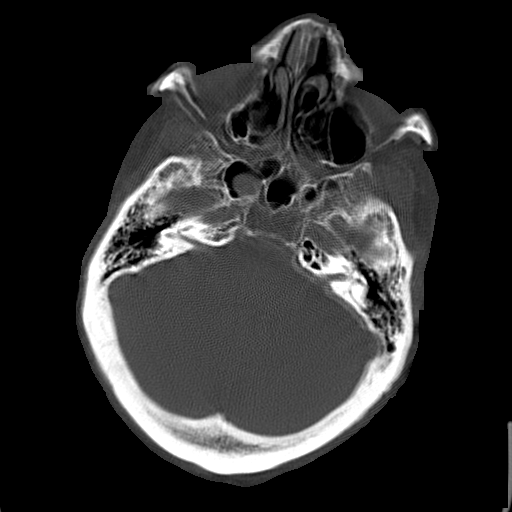
[im 29/92  bone]
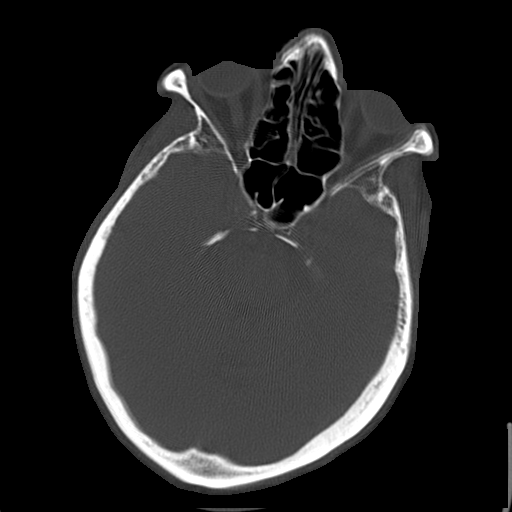
[im 43/92  bone]
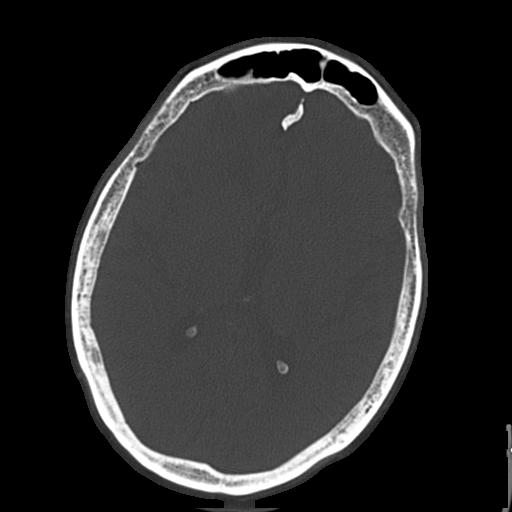
[im 50/92  bone]
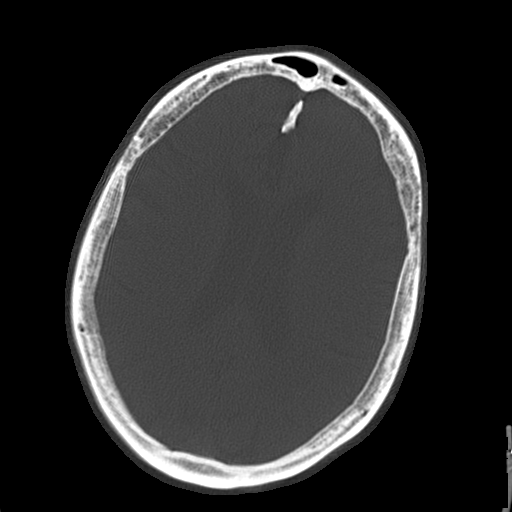
[im 64/92  bone]
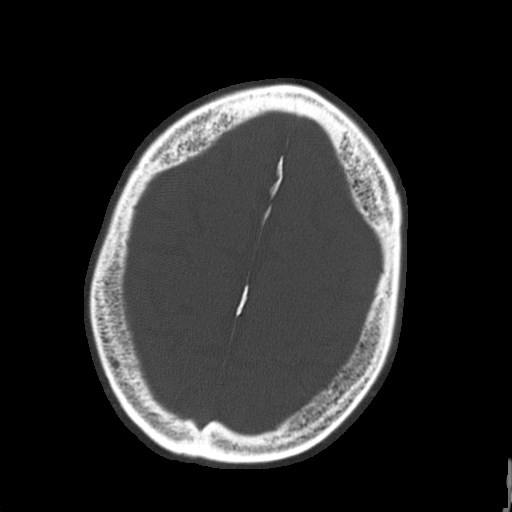
[im 71/92  bone]
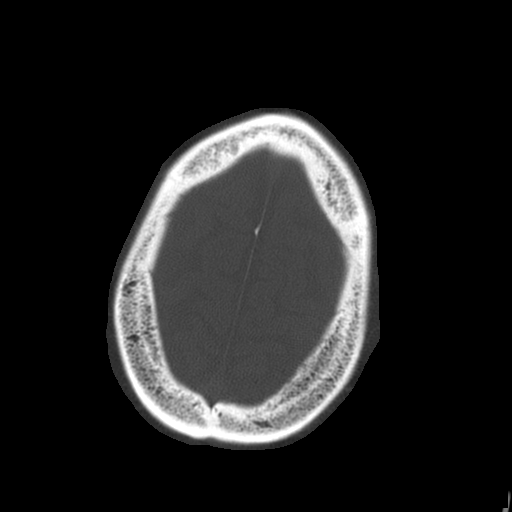
[im 85/92  bone]
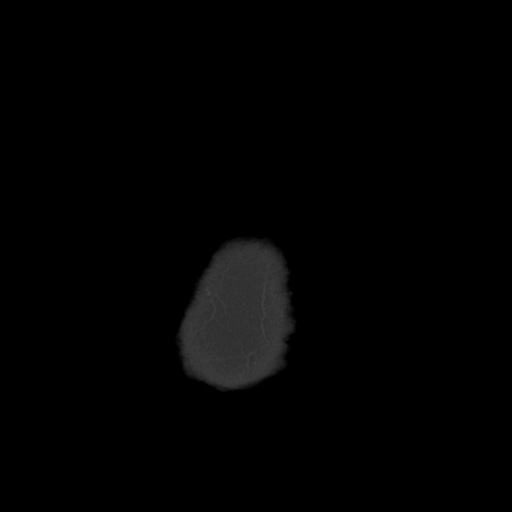

[Series 4: head wo · axial · 0.43mm/px · z∈[+1334,+1414]mm · 3 of 33 slices shown, 4 images]
[im 9/33  brain]
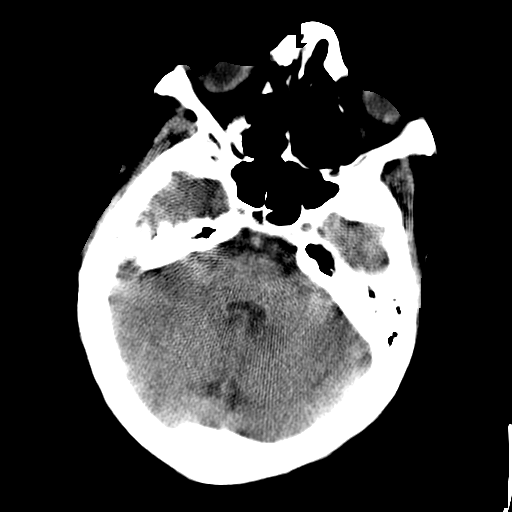
[im 9/33  bone]
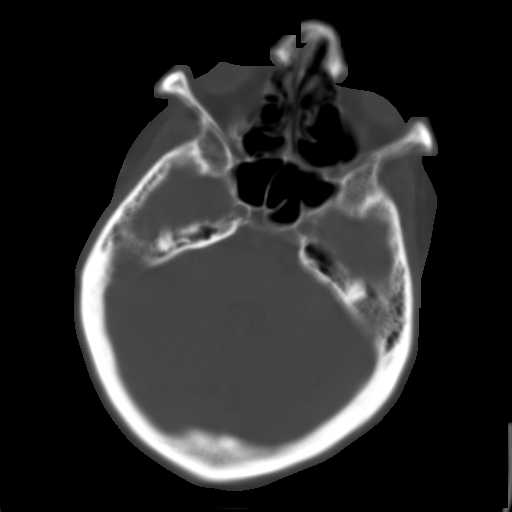
[im 17/33  brain]
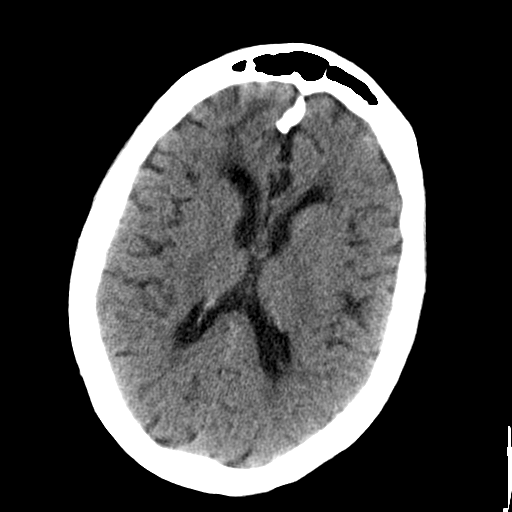
[im 25/33  brain]
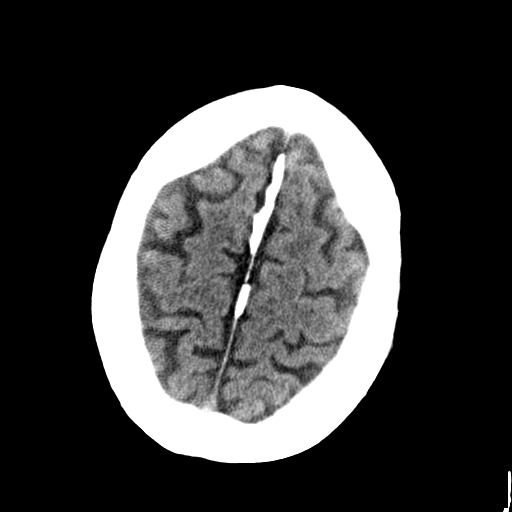

[Series 5: head bone · axial · 0.43mm/px · z∈[+1297,+1365]mm · 6 of 48 slices shown (2 of 2)]
[im 7/48  bone]
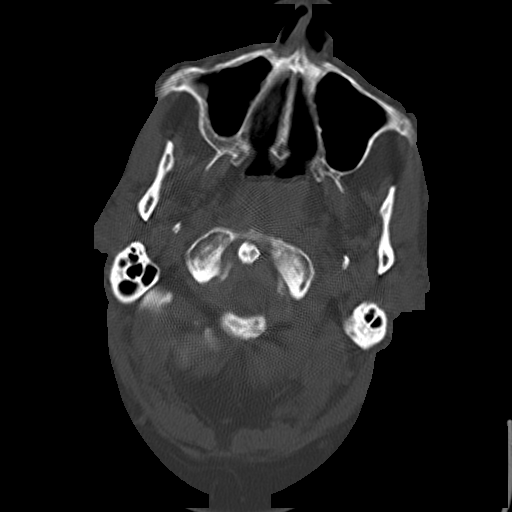
[im 14/48  bone]
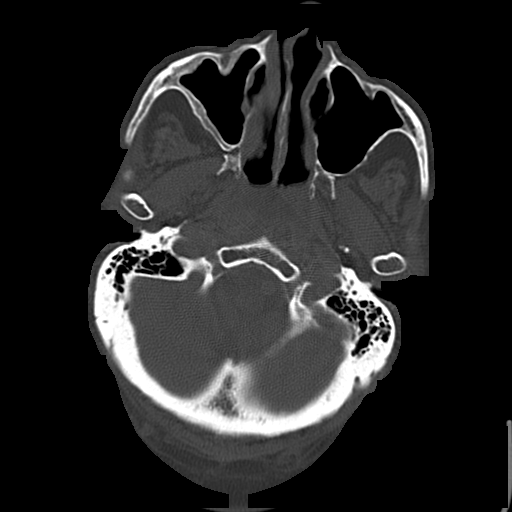
[im 21/48  bone]
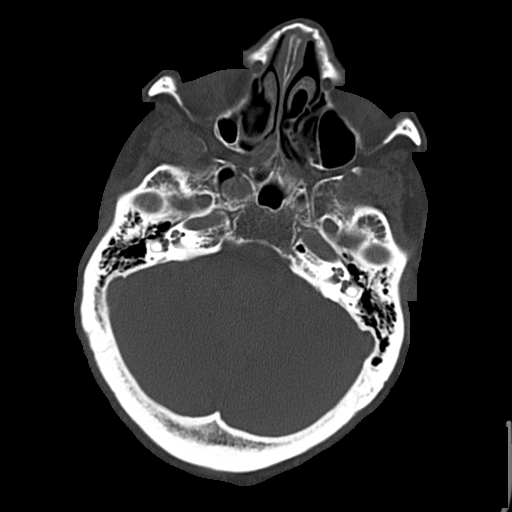
[im 27/48  bone]
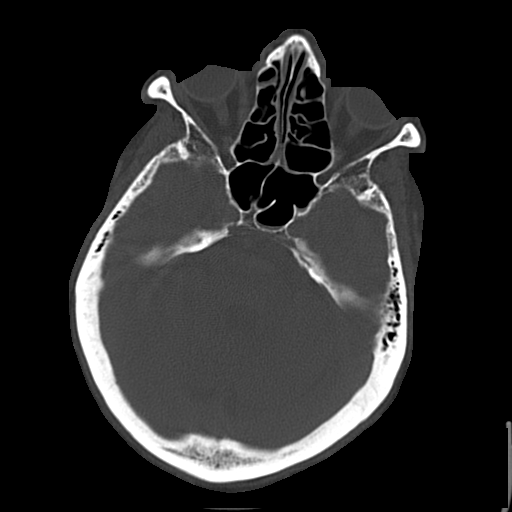
[im 34/48  bone]
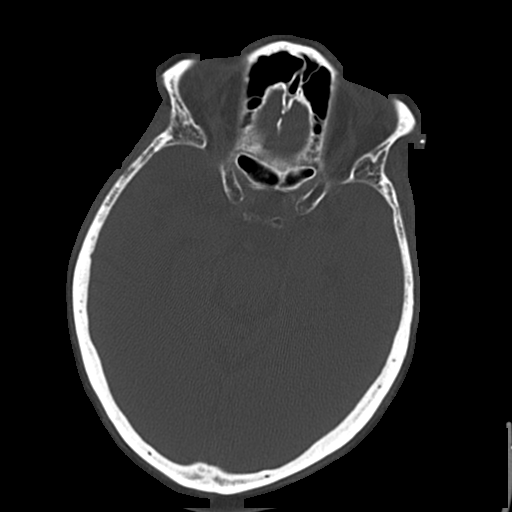
[im 41/48  bone]
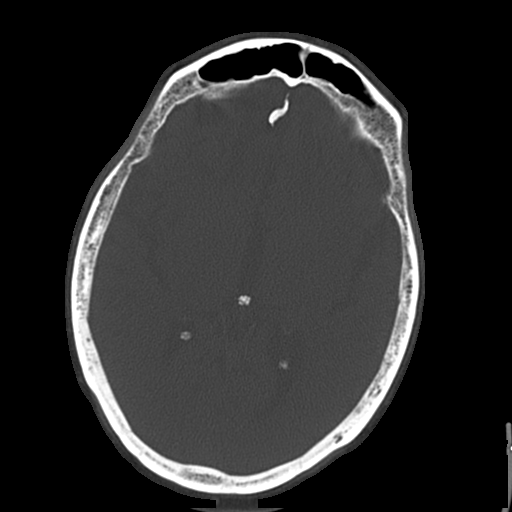

[17 of 30 positions shown; findings below may reference images not displayed]

FINDINGS: The ventricles are normal in size and position. There is no
intracranial hemorrhage nor intracranial mass effect. There is no
acute ischemic change. There is decreased density in the deep white
matter of both cerebral hemispheres consistent with chronic small
vessel ischemia. There is a small amount of fluid within the a left
sphenoid sinus cell. There is likely retention cyst or polyp in a
right sphenoid sinus cell.
IMPRESSION: There is no acute ischemic or hemorrhagic event nor other acute
abnormality of the brain. A small amount of fluid a left sphenoid
sinus cell likely reflects inflammation.

## 2015-08-06 IMAGING — CR DG CHEST 1V PORT
1 series · 1 of 1 positions shown · non-contrast
Comparison: Study obtained earlier in the day

CLINICAL DATA: Central catheter placement

EXAM:
PORTABLE CHEST - 1 VIEW

[ap]
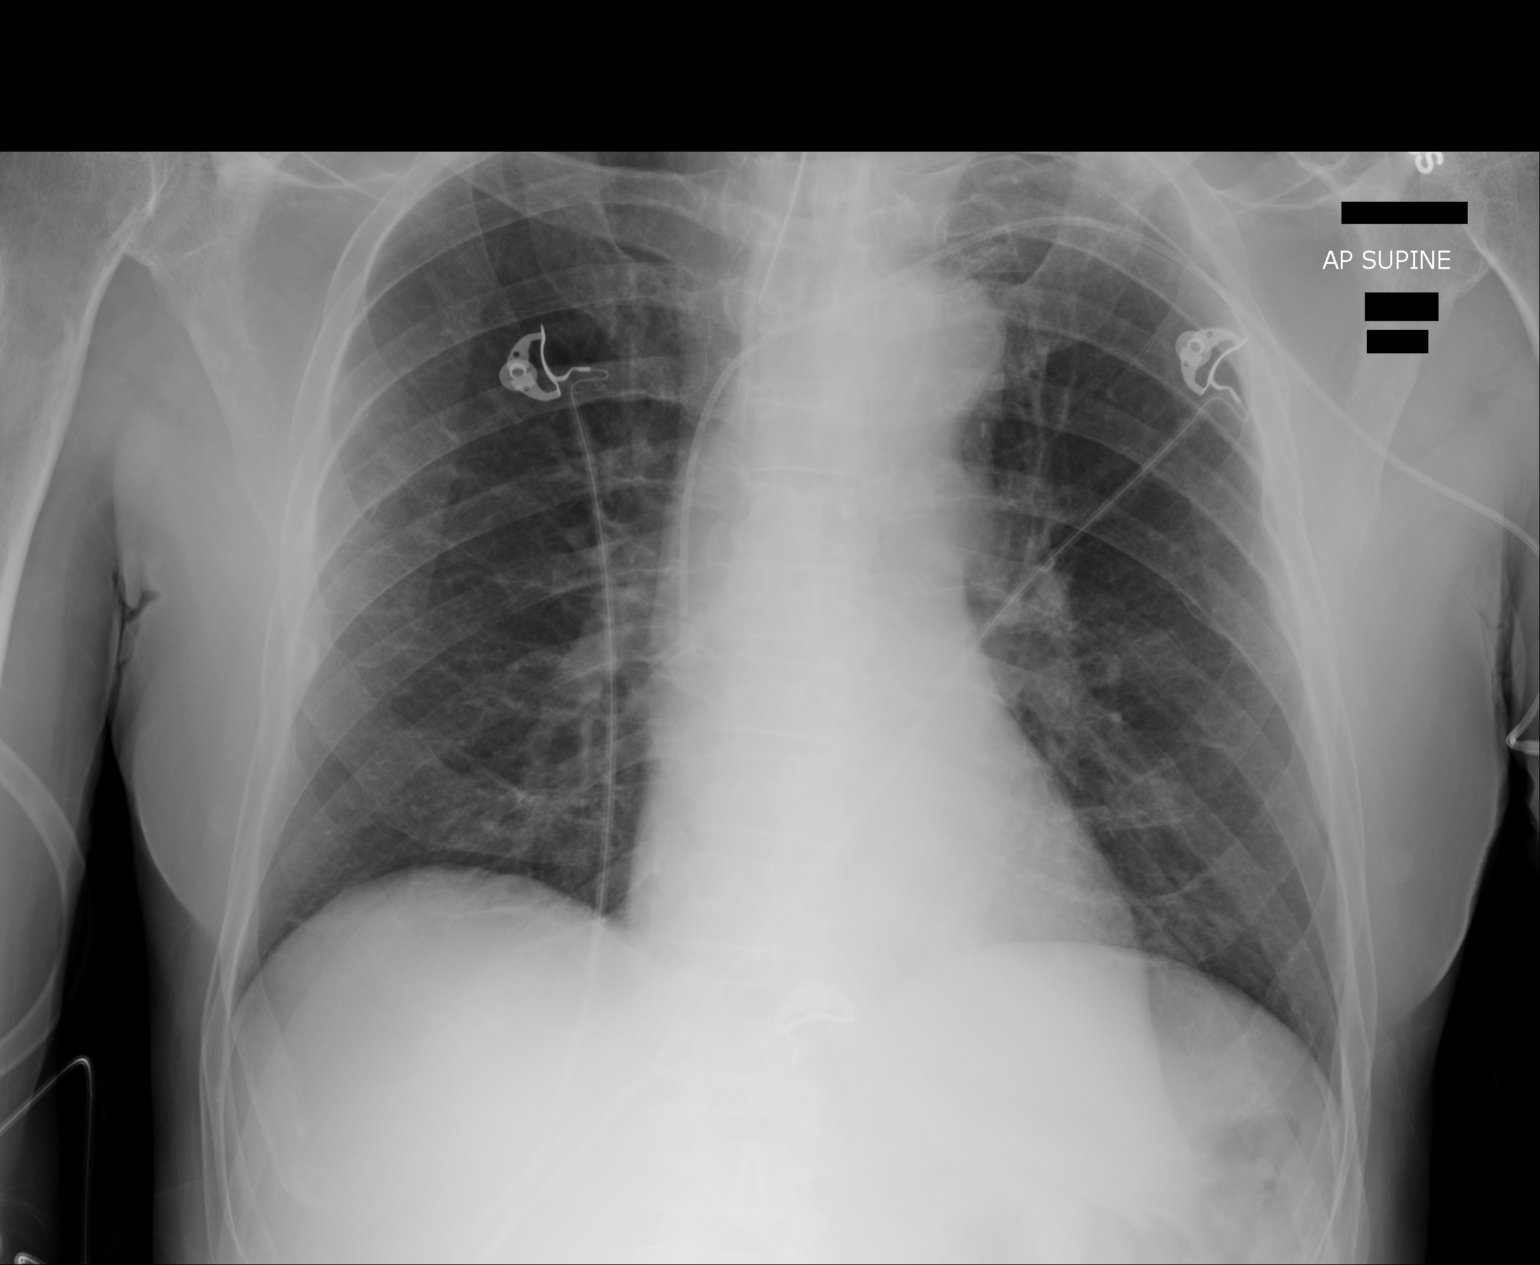

[1 of 1 positions shown; findings below may reference images not displayed]

FINDINGS: Central catheter tip is in the superior vena cava near the
cavoatrial junction. Endotracheal tube tip is 5.0 cm above the
carina. No pneumothorax. No edema or consolidation. Heart size and
pulmonary vascularity are normal. No adenopathy.
IMPRESSION: Tube and catheter positions as described without appreciable
pneumothorax. No edema or consolidation.

## 2015-10-18 ENCOUNTER — Encounter
Admission: RE | Admit: 2015-10-18 | Discharge: 2015-10-18 | Disposition: A | Discharge status: Home / Self Care | Payer: Medicare Other | Source: Ambulatory Visit | Attending: Orthopedic Surgery | Admitting: Orthopedic Surgery

## 2015-10-18 DIAGNOSIS — G629 Polyneuropathy, unspecified: Secondary | ICD-10-CM | POA: Diagnosis not present

## 2015-10-18 DIAGNOSIS — G5602 Carpal tunnel syndrome, left upper limb: Secondary | ICD-10-CM | POA: Diagnosis present

## 2015-10-18 DIAGNOSIS — D890 Polyclonal hypergammaglobulinemia: Secondary | ICD-10-CM | POA: Diagnosis not present

## 2015-10-18 DIAGNOSIS — Z8249 Family history of ischemic heart disease and other diseases of the circulatory system: Secondary | ICD-10-CM | POA: Diagnosis not present

## 2015-10-18 DIAGNOSIS — R569 Unspecified convulsions: Secondary | ICD-10-CM | POA: Diagnosis not present

## 2015-10-18 DIAGNOSIS — I252 Old myocardial infarction: Secondary | ICD-10-CM | POA: Diagnosis not present

## 2015-10-18 DIAGNOSIS — M109 Gout, unspecified: Secondary | ICD-10-CM | POA: Diagnosis not present

## 2015-10-18 DIAGNOSIS — J449 Chronic obstructive pulmonary disease, unspecified: Secondary | ICD-10-CM | POA: Diagnosis not present

## 2015-10-18 DIAGNOSIS — Z791 Long term (current) use of non-steroidal anti-inflammatories (NSAID): Secondary | ICD-10-CM | POA: Diagnosis not present

## 2015-10-18 DIAGNOSIS — Z96642 Presence of left artificial hip joint: Secondary | ICD-10-CM | POA: Diagnosis not present

## 2015-10-18 DIAGNOSIS — F17200 Nicotine dependence, unspecified, uncomplicated: Secondary | ICD-10-CM | POA: Diagnosis not present

## 2015-10-18 DIAGNOSIS — F1021 Alcohol dependence, in remission: Secondary | ICD-10-CM | POA: Diagnosis not present

## 2015-10-18 DIAGNOSIS — Z8673 Personal history of transient ischemic attack (TIA), and cerebral infarction without residual deficits: Secondary | ICD-10-CM | POA: Diagnosis not present

## 2015-10-18 DIAGNOSIS — Z7982 Long term (current) use of aspirin: Secondary | ICD-10-CM | POA: Diagnosis not present

## 2015-10-18 DIAGNOSIS — Z85038 Personal history of other malignant neoplasm of large intestine: Secondary | ICD-10-CM | POA: Diagnosis not present

## 2015-10-18 DIAGNOSIS — F419 Anxiety disorder, unspecified: Secondary | ICD-10-CM | POA: Diagnosis not present

## 2015-10-18 DIAGNOSIS — Z79899 Other long term (current) drug therapy: Secondary | ICD-10-CM | POA: Diagnosis not present

## 2015-10-18 DIAGNOSIS — Z9889 Other specified postprocedural states: Secondary | ICD-10-CM | POA: Diagnosis not present

## 2015-10-18 DIAGNOSIS — Z806 Family history of leukemia: Secondary | ICD-10-CM | POA: Diagnosis not present

## 2015-10-18 DIAGNOSIS — Z85821 Personal history of Merkel cell carcinoma: Secondary | ICD-10-CM | POA: Diagnosis not present

## 2015-10-18 HISTORY — DX: Major depressive disorder, single episode, unspecified: F32.9

## 2015-10-18 HISTORY — DX: Acute myocardial infarction, unspecified: I21.9

## 2015-10-18 HISTORY — DX: Essential (primary) hypertension: I10

## 2015-10-18 HISTORY — DX: Alcohol abuse, uncomplicated: F10.10

## 2015-10-18 HISTORY — DX: Anxiety disorder, unspecified: F41.9

## 2015-10-18 HISTORY — DX: Unspecified osteoarthritis, unspecified site: M19.90

## 2015-10-18 HISTORY — DX: Gout, unspecified: M10.9

## 2015-10-18 HISTORY — DX: Malignant (primary) neoplasm, unspecified: C80.1

## 2015-10-18 HISTORY — DX: Depression, unspecified: F32.A

## 2015-10-18 HISTORY — DX: Anemia, unspecified: D64.9

## 2015-10-18 HISTORY — DX: Cerebral infarction, unspecified: I63.9

## 2015-10-18 HISTORY — DX: Chronic obstructive pulmonary disease, unspecified: J44.9

## 2015-10-18 HISTORY — DX: Tremor, unspecified: R25.1

## 2015-10-18 HISTORY — DX: Myoneural disorder, unspecified: G70.9

## 2015-10-18 HISTORY — DX: Atherosclerotic heart disease of native coronary artery without angina pectoris: I25.10

## 2015-10-18 HISTORY — DX: Unspecified convulsions: R56.9

## 2015-10-18 LAB — DIFFERENTIAL
BASOS PCT: 1 %
Basophils Absolute: 0.1 10*3/uL (ref 0–0.1)
EOS ABS: 0.1 10*3/uL (ref 0–0.7)
EOS PCT: 2 %
LYMPHS ABS: 1.6 10*3/uL (ref 1.0–3.6)
Lymphocytes Relative: 31 %
Monocytes Absolute: 0.5 10*3/uL (ref 0.2–1.0)
Monocytes Relative: 9 %
NEUTROS PCT: 57 %
Neutro Abs: 2.9 10*3/uL (ref 1.4–6.5)

## 2015-10-18 LAB — CBC
HCT: 43.9 % (ref 40.0–52.0)
HEMOGLOBIN: 14.5 g/dL (ref 13.0–18.0)
MCH: 30.6 pg (ref 26.0–34.0)
MCHC: 33 g/dL (ref 32.0–36.0)
MCV: 92.6 fL (ref 80.0–100.0)
Platelets: 234 10*3/uL (ref 150–440)
RBC: 4.74 MIL/uL (ref 4.40–5.90)
RDW: 14.6 % — AB (ref 11.5–14.5)
WBC: 5.1 10*3/uL (ref 3.8–10.6)

## 2015-10-18 LAB — COMPREHENSIVE METABOLIC PANEL
ALBUMIN: 4.4 g/dL (ref 3.5–5.0)
ALT: 35 U/L (ref 17–63)
ANION GAP: 5 (ref 5–15)
AST: 51 U/L — ABNORMAL HIGH (ref 15–41)
Alkaline Phosphatase: 87 U/L (ref 38–126)
BILIRUBIN TOTAL: 0.5 mg/dL (ref 0.3–1.2)
BUN: 15 mg/dL (ref 6–20)
CHLORIDE: 110 mmol/L (ref 101–111)
CO2: 28 mmol/L (ref 22–32)
Calcium: 9 mg/dL (ref 8.9–10.3)
Creatinine, Ser: 1.03 mg/dL (ref 0.61–1.24)
GFR calc Af Amer: >60 mL/min (ref >60)
GLUCOSE: 92 mg/dL (ref 65–99)
POTASSIUM: 4.6 mmol/L (ref 3.5–5.1)
Sodium: 143 mmol/L (ref 135–145)
TOTAL PROTEIN: 8 g/dL (ref 6.5–8.1)

## 2015-10-18 NOTE — Pre-Procedure Instructions (Signed)
DR [facility] OFFICE FAXED AND CALLED TO [facility]IND[facility] THE REQUEST BY DR Vashti Hey. HE STATES PATIENT DOES NOT HAVE TO BE [facility] IF MEDICAL NOTE NOT OBTAINED. THEY WOULD PLAN BLOCK.

## 2015-10-18 NOTE — Patient Instructions (Addendum)
  Your procedure is scheduled on: 10/19/15 Report to Day Surgery. [facility]ICAL [facility] To find out your arrival time please call [phone] between 1PM - 3PM on 10/18/15 Remember: Instructions that are not followed completely may result in serious medical risk, up to and including death, or upon the discretion of your surgeon and anesthesiologist your surgery may need to be rescheduled.    __X__ 1. Do not eat food or drink liquids after midnight. No gum chewing or hard candies.     __X__ 2. No Alcohol for 24 hours before or after surgery.   ____ 3. Bring all medications with you on the day of surgery if instructed.    __X__ 4. Notify your doctor if there is any change in your medical condition     (cold, fever, infections).     Do not wear jewelry, make-up, hairpins, clips or nail polish.  Do not wear lotions, powders, or perfumes. You may wear deodorant.  Do not shave 48 hours prior to surgery. Men may shave face and neck.  Do not bring valuables to the hospital.    [facility] is not responsible for any belongings or valuables.               Contacts, dentures or bridgework may not be worn into surgery.  Leave your suitcase in the car. After surgery it may be brought to your room.  For patients admitted to the hospital, discharge time is determined by your                treatment team.   Patients discharged the day of surgery will not be allowed to drive home.   Please read over the following fact sheets that you were given:   Surgical Site Infection Prevention   ____ Take these medicines the morning of surgery with A SIP OF WATER:    1. NEURONTIN  2. XANAX  3. KEPPRA  4.PROPANOLOL 5.  6.  ____ Fleet Enema (as directed)   __X_ Use CHG Soap as directed  ____ Use inhalers on the day of surgery  ____ Stop metformin 2 days prior to surgery    ____ Take 1/2 of usual insulin dose the night before surgery and none on the morning of surgery.   ____ Stop Coumadin/Plavix/aspirin  on  _X___ Stop Anti-inflammatories on  NO MORE IBUPROFEN UNTIL AFTER SURGERY   ____ Stop supplements until after surgery.    ____ Bring C-Pap to the hospital.

## 2015-10-19 ENCOUNTER — Ambulatory Visit: Payer: Medicare Other | Admitting: Anesthesiology

## 2015-10-19 ENCOUNTER — Ambulatory Visit
Admission: RE | Admit: 2015-10-19 | Discharge: 2015-10-19 | Disposition: A | Discharge status: Home / Self Care | Payer: Medicare Other | Source: Ambulatory Visit | Attending: Orthopedic Surgery | Admitting: Orthopedic Surgery

## 2015-10-19 ENCOUNTER — Encounter
Admission: RE | Disposition: A | Discharge status: Home / Self Care | Payer: Self-pay | Source: Ambulatory Visit | Attending: Orthopedic Surgery

## 2015-10-19 DIAGNOSIS — Z791 Long term (current) use of non-steroidal anti-inflammatories (NSAID): Secondary | ICD-10-CM | POA: Insufficient documentation

## 2015-10-19 DIAGNOSIS — Z9889 Other specified postprocedural states: Secondary | ICD-10-CM | POA: Insufficient documentation

## 2015-10-19 DIAGNOSIS — R569 Unspecified convulsions: Secondary | ICD-10-CM | POA: Insufficient documentation

## 2015-10-19 DIAGNOSIS — G5602 Carpal tunnel syndrome, left upper limb: Secondary | ICD-10-CM | POA: Insufficient documentation

## 2015-10-19 DIAGNOSIS — Z85821 Personal history of Merkel cell carcinoma: Secondary | ICD-10-CM | POA: Insufficient documentation

## 2015-10-19 DIAGNOSIS — Z8673 Personal history of transient ischemic attack (TIA), and cerebral infarction without residual deficits: Secondary | ICD-10-CM | POA: Insufficient documentation

## 2015-10-19 DIAGNOSIS — Z8249 Family history of ischemic heart disease and other diseases of the circulatory system: Secondary | ICD-10-CM | POA: Insufficient documentation

## 2015-10-19 DIAGNOSIS — F17200 Nicotine dependence, unspecified, uncomplicated: Secondary | ICD-10-CM | POA: Insufficient documentation

## 2015-10-19 DIAGNOSIS — Z7982 Long term (current) use of aspirin: Secondary | ICD-10-CM | POA: Insufficient documentation

## 2015-10-19 DIAGNOSIS — J449 Chronic obstructive pulmonary disease, unspecified: Secondary | ICD-10-CM | POA: Insufficient documentation

## 2015-10-19 DIAGNOSIS — Z79899 Other long term (current) drug therapy: Secondary | ICD-10-CM | POA: Insufficient documentation

## 2015-10-19 DIAGNOSIS — D890 Polyclonal hypergammaglobulinemia: Secondary | ICD-10-CM | POA: Insufficient documentation

## 2015-10-19 DIAGNOSIS — Z85038 Personal history of other malignant neoplasm of large intestine: Secondary | ICD-10-CM | POA: Insufficient documentation

## 2015-10-19 DIAGNOSIS — G629 Polyneuropathy, unspecified: Secondary | ICD-10-CM | POA: Insufficient documentation

## 2015-10-19 DIAGNOSIS — M109 Gout, unspecified: Secondary | ICD-10-CM | POA: Insufficient documentation

## 2015-10-19 DIAGNOSIS — Z806 Family history of leukemia: Secondary | ICD-10-CM | POA: Insufficient documentation

## 2015-10-19 DIAGNOSIS — Z96642 Presence of left artificial hip joint: Secondary | ICD-10-CM | POA: Insufficient documentation

## 2015-10-19 DIAGNOSIS — F419 Anxiety disorder, unspecified: Secondary | ICD-10-CM | POA: Insufficient documentation

## 2015-10-19 DIAGNOSIS — I252 Old myocardial infarction: Secondary | ICD-10-CM | POA: Insufficient documentation

## 2015-10-19 DIAGNOSIS — F1021 Alcohol dependence, in remission: Secondary | ICD-10-CM | POA: Insufficient documentation

## 2015-10-19 HISTORY — PX: CARPAL TUNNEL RELEASE: SHX101

## 2015-10-19 SURGERY — CARPAL TUNNEL RELEASE
Anesthesia: General | Laterality: Left | Wound class: Clean

## 2015-10-19 MED ORDER — BUPIVACAINE HCL 0.5 % IJ SOLN
INTRAMUSCULAR | Status: DC | PRN
  Administered 2015-10-19: 10 mL

## 2015-10-19 MED ORDER — LIDOCAINE HCL (CARDIAC) 20 MG/ML IV SOLN
INTRAVENOUS | Status: DC | PRN
  Administered 2015-10-19: 80 mg via INTRAVENOUS

## 2015-10-19 MED ORDER — LABETALOL HCL 5 MG/ML IV SOLN
INTRAVENOUS | Status: AC
  Administered 2015-10-19: 5 mg
  Filled 2015-10-19: qty 4

## 2015-10-19 MED ORDER — CEFAZOLIN SODIUM-DEXTROSE 2-3 GM-% IV SOLR
INTRAVENOUS | Status: AC
  Filled 2015-10-19: qty 50

## 2015-10-19 MED ORDER — LACTATED RINGERS IV SOLN
INTRAVENOUS | Status: DC
  Administered 2015-10-19: 13:00:00 via INTRAVENOUS

## 2015-10-19 MED ORDER — HYDROCODONE-ACETAMINOPHEN 5-325 MG PO TABS: 1.0000 | ORAL_TABLET | Freq: Four times a day (QID) | ORAL | Status: DC | PRN

## 2015-10-19 MED ORDER — ROCURONIUM BROMIDE 100 MG/10ML IV SOLN
INTRAVENOUS | Status: DC | PRN
  Administered 2015-10-19: 10 mg via INTRAVENOUS

## 2015-10-19 MED ORDER — CEFAZOLIN SODIUM-DEXTROSE 2-3 GM-% IV SOLR: 2.0000 g | INTRAVENOUS | Status: DC

## 2015-10-19 MED ORDER — BUPIVACAINE HCL (PF) 0.5 % IJ SOLN
INTRAMUSCULAR | Status: AC
  Filled 2015-10-19: qty 30

## 2015-10-19 MED ORDER — FENTANYL CITRATE (PF) 100 MCG/2ML IJ SOLN
INTRAMUSCULAR | Status: AC
  Filled 2015-10-19: qty 2

## 2015-10-19 MED ORDER — FENTANYL CITRATE (PF) 100 MCG/2ML IJ SOLN
25.0000 ug | INTRAMUSCULAR | Status: DC | PRN
  Administered 2015-10-19 (×3): 25 ug via INTRAVENOUS

## 2015-10-19 MED ORDER — MIDAZOLAM HCL 2 MG/2ML IJ SOLN
INTRAMUSCULAR | Status: DC | PRN
  Administered 2015-10-19: 2 mg via INTRAVENOUS

## 2015-10-19 MED ORDER — ONDANSETRON HCL 4 MG/2ML IJ SOLN: 4.0000 mg | Freq: Once | INTRAMUSCULAR | Status: DC | PRN

## 2015-10-19 MED ORDER — SUCCINYLCHOLINE CHLORIDE 20 MG/ML IJ SOLN
INTRAMUSCULAR | Status: DC | PRN
  Administered 2015-10-19: 100 mg via INTRAVENOUS

## 2015-10-19 MED ORDER — FAMOTIDINE 20 MG PO TABS
ORAL_TABLET | ORAL | Status: AC
  Filled 2015-10-19: qty 1

## 2015-10-19 MED ORDER — FAMOTIDINE 20 MG PO TABS
20.0000 mg | ORAL_TABLET | Freq: Once | ORAL | Status: AC
  Administered 2015-10-19: 20 mg via ORAL

## 2015-10-19 MED ORDER — LABETALOL HCL 5 MG/ML IV SOLN
5.0000 mg | INTRAVENOUS | Status: AC | PRN
  Administered 2015-10-19 (×2): 5 mg via INTRAVENOUS

## 2015-10-19 MED ORDER — FENTANYL CITRATE (PF) 100 MCG/2ML IJ SOLN
INTRAMUSCULAR | Status: DC | PRN
  Administered 2015-10-19: 50 ug via INTRAVENOUS

## 2015-10-19 MED ORDER — CHLORHEXIDINE GLUCONATE 4 % EX LIQD: 60.0000 mL | Freq: Once | CUTANEOUS | Status: DC

## 2015-10-19 MED ORDER — LIDOCAINE HCL (PF) 1 % IJ SOLN
INTRAMUSCULAR | Status: AC
  Filled 2015-10-19: qty 30

## 2015-10-19 MED ORDER — PROPOFOL 10 MG/ML IV BOLUS
INTRAVENOUS | Status: DC | PRN
  Administered 2015-10-19: 150 mg via INTRAVENOUS

## 2015-10-19 SURGICAL SUPPLY — 25 items
BANDAGE ACE 3X5.8 VEL STRL LF (GAUZE/BANDAGES/DRESSINGS) ×3 IMPLANT
BNDG ESMARK 4X12 TAN STRL LF (GAUZE/BANDAGES/DRESSINGS) ×3 IMPLANT
CANISTER SUCT 1200ML W/VALVE (MISCELLANEOUS) ×3 IMPLANT
CHLORAPREP W/TINT 26ML (MISCELLANEOUS) ×3 IMPLANT
ELECT CAUTERY NEEDLE 2.0 MIC (NEEDLE) ×3 IMPLANT
ELECT CAUTERY NEEDLE TIP 1.0 (MISCELLANEOUS) ×3
ELECTRODE CAUTERY NEDL TIP 1.0 (MISCELLANEOUS) ×1 IMPLANT
GAUZE PETRO XEROFOAM 1X8 (MISCELLANEOUS) ×3 IMPLANT
GAUZE SPONGE 4X4 12PLY STRL (GAUZE/BANDAGES/DRESSINGS) ×3 IMPLANT
GLOVE BIOGEL PI IND STRL 9 (GLOVE) ×1 IMPLANT
GLOVE BIOGEL PI INDICATOR 9 (GLOVE) ×2
GLOVE SURG ORTHO 9.0 STRL STRW (GLOVE) ×3 IMPLANT
GOWN SPECIALTY ULTRA XL (MISCELLANEOUS) ×3 IMPLANT
GOWN STRL REUS W/ TWL LRG LVL3 (GOWN DISPOSABLE) ×1 IMPLANT
GOWN STRL REUS W/TWL 2XL LVL3 (GOWN DISPOSABLE) ×3 IMPLANT
GOWN STRL REUS W/TWL LRG LVL3 (GOWN DISPOSABLE) ×2
KIT RM TURNOVER STRD PROC AR (KITS) ×3 IMPLANT
NS IRRIG 500ML POUR BTL (IV SOLUTION) ×3 IMPLANT
PACK EXTREMITY ARMC (MISCELLANEOUS) ×3 IMPLANT
PAD CAST CTTN 4X4 STRL (SOFTGOODS) ×1 IMPLANT
PADDING CAST COTTON 4X4 STRL (SOFTGOODS) ×2
STOCKINETTE STRL 4IN 9604848 (GAUZE/BANDAGES/DRESSINGS) ×3 IMPLANT
SUT ETHILON 4-0 (SUTURE) ×2
SUT ETHILON 4-0 FS2 18XMFL BLK (SUTURE) ×1
SUTURE ETHLN 4-0 FS2 18XMF BLK (SUTURE) ×1 IMPLANT

## 2015-10-19 NOTE — Anesthesia Preprocedure Evaluation (Signed)
Anesthesia Evaluation  Patient identified by MRN, date of birth, ID band Patient awake    Reviewed: Allergy & Precautions, NPO status , Patient's Chart, lab work & pertinent test results, reviewed documented beta blocker date and time   Airway Mallampati: III  TM Distance: >3 FB     Dental  (+) Chipped   Pulmonary COPD, Current Smoker,           Cardiovascular hypertension, Pt. on medications and Pt. on home beta blockers + CAD and + Past MI       Neuro/Psych Seizures -,  PSYCHIATRIC DISORDERS Anxiety Depression  Neuromuscular disease CVA    GI/Hepatic   Endo/Other    Renal/GU      Musculoskeletal  (+) Arthritis ,   Abdominal   Peds  Hematology  (+) anemia ,   Anesthesia Other Findings Gout. Neuropathy. MI. ETOH abuse. Throat Ca. Smokes.  Reproductive/Obstetrics                             Anesthesia Physical Anesthesia Plan  ASA: IV  Anesthesia Plan: General   Post-op Pain Management:    Induction: Intravenous  Airway Management Planned: LMA  Additional Equipment:   Intra-op Plan:   Post-operative Plan:   Informed Consent: I have reviewed the patients History and Physical, chart, labs and discussed the procedure including the risks, benefits and alternatives for the proposed anesthesia with the patient or authorized representative who has indicated his/her understanding and acceptance.     Plan Discussed with: CRNA  Anesthesia Plan Comments:         Anesthesia Quick Evaluation

## 2015-10-19 NOTE — OR Nursing (Signed)
Called [facility] for report prior to patient arrival today.  Reported patient had not had anything to eat or drink however patient states he drank coffee this am at 0500.  Called [facility] back to clarify med list that patient has with him.  Patient only had one dose of his beta blocker.

## 2015-10-19 NOTE — Discharge Instructions (Signed)
AMBULATORY SURGERY  DISCHARGE INSTRUCTIONS   1) The drugs that you were given will stay in your system until tomorrow so for the next 24 hours you should not:  A) Drive an automobile B) Make any legal decisions C) Drink any alcoholic beverage   2) You may resume regular meals tomorrow.  Today it is better to start with liquids and gradually work up to solid foods.  You may eat anything you prefer, but it is better to start with liquids, then soup and crackers, and gradually work up to solid foods.   3) Please notify your doctor immediately if you have any unusual bleeding, trouble breathing, redness and pain at the surgery site, drainage, fever, or pain not relieved by medication.    4) Additional Instructions:    Loosen Ace wrap if needed worse finger swelling. Otherwise leave dressing in place. Encourage finger motion    Please contact your physician with any problems or Same Day Surgery at [phone], Monday through Friday 6 am to 4 pm, or [place] at [facility] number at [phone].

## 2015-10-19 NOTE — Progress Notes (Signed)
Awake. Continues c/o wanting something to eat. Peanut butter sandwich given to pt. Refuses to eat because has nothing to eat with it. Coffee provided. Tolerated well.

## 2015-10-19 NOTE — Transfer of Care (Signed)
Immediate Anesthesia Transfer of Care Note  Patient: Calvin [name]  Procedure(s) Performed: Procedure(s): CARPAL TUNNEL RELEASE (Left)  Patient Location: PACU  Anesthesia Type:General  Level of Consciousness: awake, alert  and oriented  Airway & Oxygen Therapy: Patient Spontanous Breathing and Patient connected to face mask oxygen  Post-op Assessment: Report given to RN and Post -op Vital signs reviewed and stable  Post vital signs: Reviewed and stable  Last Vitals: 1357 100% sat 20resp 68hr 155/100 Filed Vitals:   10/19/15 1146  BP: 164/104  Pulse: 115  Temp: 35.9 C  Resp: 16    Complications: No apparent anesthesia complications

## 2015-10-19 NOTE — Progress Notes (Signed)
Awake. Oriented to place per nurse. Wants something to eat. Refuses graham crackers and peanut butter.

## 2015-10-19 NOTE — H&P (Signed)
Reviewed paper H+P, will be scanned into chart. No changes noted.  

## 2015-10-19 NOTE — Progress Notes (Signed)
BP 179/109. Dr Marcello Moores notified of pt condition. Order given.

## 2015-10-19 NOTE — Anesthesia Procedure Notes (Signed)
Procedure Name: Intubation Date/Time: 10/19/2015 1:20 PM Performed by: Delaney Meigs Pre-anesthesia Checklist: Patient identified, Emergency Drugs available, Suction available, Patient being monitored and Timeout performed Patient Re-evaluated:Patient Re-evaluated prior to inductionOxygen Delivery Method: Circle system utilized Preoxygenation: Pre-oxygenation with 100% oxygen Intubation Type: IV induction Ventilation: Mask ventilation without difficulty Laryngoscope Size: Mac and 3 Grade View: Grade I Tube type: Oral Number of attempts: 1 Airway Equipment and Method: Stylet Placement Confirmation: ETT inserted through vocal cords under direct vision,  positive ETCO2 and breath sounds checked- equal and bilateral Secured at: 21 cm Tube secured with: Tape

## 2015-10-19 NOTE — Progress Notes (Signed)
BP 160-170 over 100's. Dr Marcello Moores notified. Cleared for d/c to SDS.

## 2015-10-19 NOTE — Op Note (Signed)
10/19/2015  1:48 PM  PATIENT:  Calvin [name]  65 y.o. male  PRE-OPERATIVE DIAGNOSIS:  CARPAL TUNNEL SYNDROME left  POST-OPERATIVE DIAGNOSIS:  CARPAL TUNNEL SYNDROME left  PROCEDURE:  Procedure(s): CARPAL TUNNEL RELEASE (Left)  SURGEON: Laurene Footman, MD  ASSISTANTS: None  ANESTHESIA:   general  EBL:     BLOOD ADMINISTERED:none  DRAINS: none   LOCAL MEDICATIONS USED:  MARCAINE     SPECIMEN:  No Specimen  DISPOSITION OF SPECIMEN:  N/A  COUNTS:  YES  TOURNIQUET:   10 minutes at 250 mmHg  IMPLANTS: None  DICTATION: .Dragon Dictation patient brought the operating room and after adequate general anesthesia was obtained left arm prepped draped in sterile fashion was turned applied to the proximal forearm. After prepping and draping of appropriate patient identification and timeout procedures were completed. Tourniquet was raised to her 50 murmurs mercury. Approximately to have some incision was made 3 metacarpal skin incision carried down to the transverse carpal ligament. Transverse carpal ligament was opened with a vascular hemostat underlying to protect the midline structures releases carried out distally to there is fat noted around the nerve, proximally approximately to the wrist flexion crease there was tight fascia in the distal forearm. After release there appeared to be good vascular blush to the nerve and release of constriction in the proximal half of the carpal tunnel. X-rays the carpal tunnel revealed moderate flexor tenosynovitis. At the incision was then infiltrated with 10 cc of half percent Sensorcaine obtained postop analgesia. The wound was irrigated and then closed with simple interrupted 5-0 nylon skin sutures. Xeroform 4 x 4 web roll and Ace wrap applied  PLAN OF CARE: Discharge to home after PACU  PATIENT DISPOSITION:  PACU - hemodynamically stable.

## 2015-10-19 NOTE — Anesthesia Postprocedure Evaluation (Signed)
Anesthesia Post Note  Patient: Calvin [name]  Procedure(s) Performed: Procedure(s) (LRB): CARPAL TUNNEL RELEASE (Left)  Patient location during evaluation: PACU Anesthesia Type: General Level of consciousness: awake and alert Pain management: pain level controlled Vital Signs Assessment: post-procedure vital signs reviewed and stable Respiratory status: spontaneous breathing, nonlabored ventilation, respiratory function stable and patient connected to nasal cannula oxygen Cardiovascular status: blood pressure returned to baseline and stable Postop Assessment: no signs of nausea or vomiting Anesthetic complications: no    Last Vitals:  Filed Vitals:   10/19/15 1440 10/19/15 1445  BP: 183/107 190/98  Pulse: 65 54  Temp:  36.4 C  Resp: 15 16    Last Pain:  Filed Vitals:   10/19/15 1457  PainSc: [place]

## 2015-10-20 ENCOUNTER — Encounter: Payer: Self-pay | Admitting: Orthopedic Surgery

## 2017-05-24 ENCOUNTER — Other Ambulatory Visit: Payer: Self-pay | Admitting: Neurology

## 2017-05-24 DIAGNOSIS — M5412 Radiculopathy, cervical region: Secondary | ICD-10-CM

## 2017-05-31 ENCOUNTER — Ambulatory Visit
Admission: RE | Admit: 2017-05-31 | Discharge: 2017-05-31 | Disposition: A | Discharge status: Home / Self Care | Payer: Medicare Other | Source: Ambulatory Visit | Attending: Neurology | Admitting: Neurology

## 2017-05-31 DIAGNOSIS — R938 Abnormal findings on diagnostic imaging of other specified body structures: Secondary | ICD-10-CM | POA: Insufficient documentation

## 2017-05-31 DIAGNOSIS — M5023 Other cervical disc displacement, cervicothoracic region: Secondary | ICD-10-CM | POA: Diagnosis not present

## 2017-05-31 DIAGNOSIS — M4802 Spinal stenosis, cervical region: Secondary | ICD-10-CM | POA: Diagnosis not present

## 2017-05-31 DIAGNOSIS — R202 Paresthesia of skin: Secondary | ICD-10-CM | POA: Diagnosis present

## 2017-05-31 DIAGNOSIS — M5412 Radiculopathy, cervical region: Secondary | ICD-10-CM | POA: Diagnosis not present

## 2017-05-31 DIAGNOSIS — R200 Anesthesia of skin: Secondary | ICD-10-CM | POA: Diagnosis present

## 2017-09-30 ENCOUNTER — Emergency Department: Payer: Medicare Other

## 2017-09-30 ENCOUNTER — Encounter: Payer: Self-pay | Admitting: Emergency Medicine

## 2017-09-30 ENCOUNTER — Observation Stay
Admission: EM | Admit: 2017-09-30 | Discharge: 2017-10-02 | Disposition: A | Discharge status: Skilled Nursing Facility | Payer: Medicare Other | Attending: Internal Medicine | Admitting: Internal Medicine

## 2017-09-30 ENCOUNTER — Other Ambulatory Visit: Payer: Self-pay

## 2017-09-30 DIAGNOSIS — F17200 Nicotine dependence, unspecified, uncomplicated: Secondary | ICD-10-CM | POA: Insufficient documentation

## 2017-09-30 DIAGNOSIS — N3090 Cystitis, unspecified without hematuria: Secondary | ICD-10-CM | POA: Diagnosis present

## 2017-09-30 DIAGNOSIS — Z79899 Other long term (current) drug therapy: Secondary | ICD-10-CM | POA: Diagnosis not present

## 2017-09-30 DIAGNOSIS — Z8673 Personal history of transient ischemic attack (TIA), and cerebral infarction without residual deficits: Secondary | ICD-10-CM | POA: Insufficient documentation

## 2017-09-30 DIAGNOSIS — I700 Atherosclerosis of aorta: Secondary | ICD-10-CM | POA: Diagnosis not present

## 2017-09-30 DIAGNOSIS — I252 Old myocardial infarction: Secondary | ICD-10-CM | POA: Diagnosis not present

## 2017-09-30 DIAGNOSIS — M25512 Pain in left shoulder: Secondary | ICD-10-CM | POA: Diagnosis present

## 2017-09-30 DIAGNOSIS — R262 Difficulty in walking, not elsewhere classified: Secondary | ICD-10-CM | POA: Insufficient documentation

## 2017-09-30 DIAGNOSIS — I639 Cerebral infarction, unspecified: Secondary | ICD-10-CM

## 2017-09-30 DIAGNOSIS — G40909 Epilepsy, unspecified, not intractable, without status epilepticus: Secondary | ICD-10-CM | POA: Insufficient documentation

## 2017-09-30 DIAGNOSIS — I10 Essential (primary) hypertension: Secondary | ICD-10-CM | POA: Diagnosis present

## 2017-09-30 DIAGNOSIS — Z7982 Long term (current) use of aspirin: Secondary | ICD-10-CM | POA: Insufficient documentation

## 2017-09-30 DIAGNOSIS — I6523 Occlusion and stenosis of bilateral carotid arteries: Secondary | ICD-10-CM | POA: Diagnosis not present

## 2017-09-30 DIAGNOSIS — F418 Other specified anxiety disorders: Secondary | ICD-10-CM | POA: Diagnosis not present

## 2017-09-30 DIAGNOSIS — I2510 Atherosclerotic heart disease of native coronary artery without angina pectoris: Secondary | ICD-10-CM | POA: Diagnosis present

## 2017-09-30 DIAGNOSIS — R569 Unspecified convulsions: Secondary | ICD-10-CM

## 2017-09-30 DIAGNOSIS — J449 Chronic obstructive pulmonary disease, unspecified: Secondary | ICD-10-CM | POA: Diagnosis present

## 2017-09-30 DIAGNOSIS — M109 Gout, unspecified: Secondary | ICD-10-CM | POA: Diagnosis not present

## 2017-09-30 DIAGNOSIS — R531 Weakness: Secondary | ICD-10-CM | POA: Diagnosis not present

## 2017-09-30 LAB — URINALYSIS, COMPLETE (UACMP) WITH MICROSCOPIC
BILIRUBIN URINE: NEGATIVE
Glucose, UA: NEGATIVE mg/dL
KETONES UR: NEGATIVE mg/dL
NITRITE: NEGATIVE
PROTEIN: NEGATIVE mg/dL
Specific Gravity, Urine: 1.005 (ref 1.005–1.030)
pH: 7 (ref 5.0–8.0)

## 2017-09-30 LAB — BASIC METABOLIC PANEL
ANION GAP: 9 (ref 5–15)
BUN: 19 mg/dL (ref 6–20)
CO2: 27 mmol/L (ref 22–32)
Calcium: 9.3 mg/dL (ref 8.9–10.3)
Chloride: 100 mmol/L — ABNORMAL LOW (ref 101–111)
Creatinine, Ser: 1.36 mg/dL — ABNORMAL HIGH (ref 0.61–1.24)
GFR calc non Af Amer: 52 mL/min — ABNORMAL LOW (ref >60)
Glucose, Bld: 120 mg/dL — ABNORMAL HIGH (ref 65–99)
POTASSIUM: 5.1 mmol/L (ref 3.5–5.1)
SODIUM: 136 mmol/L (ref 135–145)

## 2017-09-30 LAB — CBC
HEMATOCRIT: 43.7 % (ref 40.0–52.0)
HEMOGLOBIN: 14.2 g/dL (ref 13.0–18.0)
MCH: 29.9 pg (ref 26.0–34.0)
MCHC: 32.6 g/dL (ref 32.0–36.0)
MCV: 91.7 fL (ref 80.0–100.0)
PLATELETS: 293 10*3/uL (ref 150–440)
RBC: 4.76 MIL/uL (ref 4.40–5.90)
RDW: 14.1 % (ref 11.5–14.5)
WBC: 5.2 10*3/uL (ref 3.8–10.6)

## 2017-09-30 LAB — TROPONIN I: Troponin I: <0.03 ng/mL (ref <0.03)

## 2017-09-30 MED ORDER — OXYCODONE HCL 5 MG PO TABS
5.0000 mg | ORAL_TABLET | ORAL | Status: DC | PRN
  Administered 2017-09-30 – 2017-10-02 (×7): 5 mg via ORAL
  Filled 2017-09-30 (×7): qty 1

## 2017-09-30 MED ORDER — SIMVASTATIN 20 MG PO TABS
10.0000 mg | ORAL_TABLET | Freq: Every day | ORAL | Status: DC
  Administered 2017-10-01 – 2017-10-02 (×2): 10 mg via ORAL
  Filled 2017-09-30 (×2): qty 1

## 2017-09-30 MED ORDER — ACETAMINOPHEN 650 MG RE SUPP: 650.0000 mg | RECTAL | Status: DC | PRN

## 2017-09-30 MED ORDER — IOPAMIDOL (ISOVUE-370) INJECTION 76%
100.0000 mL | Freq: Once | INTRAVENOUS | Status: AC | PRN
  Administered 2017-09-30: 100 mL via INTRAVENOUS

## 2017-09-30 MED ORDER — ALPRAZOLAM 0.5 MG PO TABS
0.5000 mg | ORAL_TABLET | Freq: Three times a day (TID) | ORAL | Status: DC
  Administered 2017-09-30 – 2017-10-02 (×6): 0.5 mg via ORAL
  Filled 2017-09-30 (×6): qty 1

## 2017-09-30 MED ORDER — CYCLOBENZAPRINE HCL 10 MG PO TABS
10.0000 mg | ORAL_TABLET | Freq: Once | ORAL | Status: AC
  Administered 2017-09-30: 10 mg via ORAL
  Filled 2017-09-30: qty 1

## 2017-09-30 MED ORDER — ACETAMINOPHEN 160 MG/5ML PO SOLN
650.0000 mg | ORAL | Status: DC | PRN
  Filled 2017-09-30: qty 20.3

## 2017-09-30 MED ORDER — CYCLOBENZAPRINE HCL 10 MG PO TABS: 10.0000 mg | ORAL_TABLET | Freq: Two times a day (BID) | ORAL | 0 refills | Status: DC | PRN

## 2017-09-30 MED ORDER — SERTRALINE HCL 50 MG PO TABS
25.0000 mg | ORAL_TABLET | Freq: Every day | ORAL | Status: DC
  Administered 2017-10-01 – 2017-10-02 (×2): 25 mg via ORAL
  Filled 2017-09-30 (×2): qty 1

## 2017-09-30 MED ORDER — GABAPENTIN 600 MG PO TABS
600.0000 mg | ORAL_TABLET | Freq: Three times a day (TID) | ORAL | Status: DC
  Administered 2017-09-30 – 2017-10-01 (×2): 600 mg via ORAL
  Filled 2017-09-30 (×2): qty 1

## 2017-09-30 MED ORDER — TIOTROPIUM BROMIDE MONOHYDRATE 18 MCG IN CAPS
18.0000 ug | ORAL_CAPSULE | Freq: Every day | RESPIRATORY_TRACT | Status: DC
  Administered 2017-10-01 – 2017-10-02 (×2): 18 ug via RESPIRATORY_TRACT
  Filled 2017-09-30: qty 5

## 2017-09-30 MED ORDER — ENOXAPARIN SODIUM 40 MG/0.4ML ~~LOC~~ SOLN
40.0000 mg | SUBCUTANEOUS | Status: DC
  Administered 2017-10-01: 40 mg via SUBCUTANEOUS
  Filled 2017-09-30: qty 0.4

## 2017-09-30 MED ORDER — CEPHALEXIN 500 MG PO CAPS
500.0000 mg | ORAL_CAPSULE | Freq: Once | ORAL | Status: AC
Start: 2017-09-30 — End: 2017-09-30
  Administered 2017-09-30: 500 mg via ORAL
  Filled 2017-09-30: qty 1

## 2017-09-30 MED ORDER — STROKE: EARLY STAGES OF RECOVERY BOOK
Freq: Once | Status: AC
  Administered 2017-09-30

## 2017-09-30 MED ORDER — SODIUM CHLORIDE 0.9 % IV BOLUS (SEPSIS)
500.0000 mL | Freq: Once | INTRAVENOUS | Status: AC
  Administered 2017-09-30: 500 mL via INTRAVENOUS

## 2017-09-30 MED ORDER — PANTOPRAZOLE SODIUM 40 MG PO TBEC
40.0000 mg | DELAYED_RELEASE_TABLET | Freq: Every day | ORAL | Status: DC
  Administered 2017-10-01 – 2017-10-02 (×2): 40 mg via ORAL
  Filled 2017-09-30 (×2): qty 1

## 2017-09-30 MED ORDER — ACETAMINOPHEN 500 MG PO TABS
1000.0000 mg | ORAL_TABLET | Freq: Once | ORAL | Status: AC
  Administered 2017-09-30: 1000 mg via ORAL
  Filled 2017-09-30: qty 2

## 2017-09-30 MED ORDER — ACETAMINOPHEN 325 MG PO TABS
650.0000 mg | ORAL_TABLET | ORAL | Status: DC | PRN
  Administered 2017-09-30: 650 mg via ORAL
  Filled 2017-09-30: qty 2

## 2017-09-30 MED ORDER — ASPIRIN 81 MG PO CHEW
81.0000 mg | CHEWABLE_TABLET | Freq: Every day | ORAL | Status: DC
  Administered 2017-10-01 – 2017-10-02 (×2): 81 mg via ORAL
  Filled 2017-09-30 (×2): qty 1

## 2017-09-30 MED ORDER — HYDROCODONE-ACETAMINOPHEN 5-325 MG PO TABS: 1.0000 | ORAL_TABLET | Freq: Four times a day (QID) | ORAL | Status: DC | PRN

## 2017-09-30 MED ORDER — CEPHALEXIN 500 MG PO CAPS: 500.0000 mg | ORAL_CAPSULE | Freq: Three times a day (TID) | ORAL | 0 refills | Status: DC

## 2017-09-30 MED ORDER — LEVETIRACETAM 500 MG PO TABS
500.0000 mg | ORAL_TABLET | Freq: Two times a day (BID) | ORAL | Status: DC
  Administered 2017-10-01 – 2017-10-02 (×4): 500 mg via ORAL
  Filled 2017-09-30 (×6): qty 1

## 2017-09-30 MED ORDER — ASPIRIN 81 MG PO CHEW
324.0000 mg | CHEWABLE_TABLET | Freq: Once | ORAL | Status: AC
  Administered 2017-09-30: 324 mg via ORAL
  Filled 2017-09-30: qty 4

## 2017-09-30 NOTE — ED Notes (Signed)
ED provider at bedside.

## 2017-09-30 NOTE — ED Notes (Signed)
Departed CT

## 2017-09-30 NOTE — ED Notes (Signed)
Arrived at CT.

## 2017-09-30 NOTE — ED Notes (Signed)
Floor called and informed patient is on the way.

## 2017-09-30 NOTE — H&P (Signed)
[name] NAME: Calvin [name]    MR#:  [id number]  DATE OF BIRTH:  July 03, 1951  DATE OF ADMISSION:  09/30/2017  PRIMARY CARE PHYSICIAN: Lynnell Jude, MD   REQUESTING/REFERRING PHYSICIAN: Joni Fears, MD  CHIEF COMPLAINT:   Chief Complaint  Patient presents with  . Chest Pain    HISTORY OF PRESENT ILLNESS:  Calvin [name]  is a 67 y.o. male who presents with left arm pain and subsequent left arm and leg weakness with left facial numbness.  Initial exam in the ED is consistent with possible stroke.  Hospitalist called for admission and further evaluation  PAST MEDICAL HISTORY:   Past Medical History:  Diagnosis Date  . Alcohol abuse    drinks on weekend  . Anemia   . Anxiety   . Arthritis   . Cancer ([place])    colon,throat  . COPD (chronic obstructive pulmonary disease) ([place])   . Coronary artery disease   . Depression   . Gout   . Hypertension   . Myocardial infarction ([place])   . Neuromuscular disorder ([place])   . Seizures ([place])    last 6 months ago  . Stroke [facility])    multiple  left side weakness  . Tremors of nervous system     PAST SURGICAL HISTORY:   Past Surgical History:  Procedure Laterality Date  . CARPAL TUNNEL RELEASE Left 10/19/2015   Procedure: CARPAL TUNNEL RELEASE;  Surgeon: Hessie Knows, MD;  Location: [facility] ORS;  Service: Orthopedics;  Laterality: Left;  . COLON SURGERY    . JOINT REPLACEMENT     left partial hip   . THROAT SURGERY  2013   cancer    SOCIAL HISTORY:   Social History   Tobacco Use  . Smoking status: Current Every Day Smoker  . Smokeless tobacco: Never Used  Substance Use Topics  . Alcohol use: Yes    Comment: weekends    FAMILY HISTORY:   Family History  Problem Relation Age of Onset  . Cancer Mother   . Hypertension Father   . Leukemia Brother     DRUG ALLERGIES:  No Known Allergies  MEDICATIONS AT HOME:   Prior to Admission medications   Medication Sig  Start Date End Date Taking? Authorizing Provider  acetaminophen (TYLENOL) 500 MG tablet Take 500 mg by mouth 3 (three) times daily.   Yes [provider]  ALPRAZolam Duanne Moron) 0.5 MG tablet Take 0.5 mg by mouth 3 (three) times daily.    Yes [provider]  Alum & Mag Hydroxide-Simeth (MYLANTA PO) Take 30 mLs by mouth every 12 (twelve) hours as needed.   Yes [provider]  aspirin 81 MG tablet Take 81 mg by mouth daily.   Yes [provider]  ergocalciferol (VITAMIN D2) 50000 units capsule Take 50,000 Units by mouth every 30 (thirty) days.   Yes [provider]  gabapentin (NEURONTIN) 600 MG tablet Take 600 mg by mouth 3 (three) times daily.    Yes [provider]  guaifenesin (ROBITUSSIN) 100 MG/5ML syrup Take 200 mg by mouth 3 (three) times daily.   Yes [provider]  HYDROcodone-acetaminophen (NORCO) 5-325 MG tablet Take 1 tablet by mouth every 6 (six) hours as needed for moderate pain. 10/19/15  Yes Hessie Knows, MD  levETIRAcetam (KEPPRA) 500 MG tablet Take 500 mg by mouth 2 (two) times daily.    Yes [provider]  lisinopril (PRINIVIL,ZESTRIL) 5 MG tablet  Take 7.5 mg by mouth daily.   Yes [provider]  nicotine polacrilex (NICORETTE) 2 MG gum Take 2 mg by mouth every 4 (four) hours as needed for smoking cessation.   Yes [provider]  nitroGLYCERIN (NITROSTAT) 0.4 MG SL tablet Place 0.4 mg under the tongue every 5 (five) minutes as needed for chest pain.   Yes [provider]  omeprazole (PRILOSEC) 20 MG capsule Take 20 mg by mouth daily.   Yes [provider]  ondansetron (ZOFRAN) 4 MG tablet Take 4 mg by mouth every 8 (eight) hours as needed for nausea or vomiting.   Yes [provider]  polyethylene glycol (MIRALAX / GLYCOLAX) packet Take 17 g by mouth every other day.    Yes [provider]  predniSONE (DELTASONE) 5 MG tablet Take 30 mg by mouth daily with  breakfast.   Yes [provider]  sertraline (ZOLOFT) 25 MG tablet Take 25 mg by mouth daily.   Yes [provider]  simvastatin (ZOCOR) 10 MG tablet Take 10 mg by mouth daily.   Yes [provider]  tiotropium (SPIRIVA) 18 MCG inhalation capsule Place 18 mcg into inhaler and inhale daily.   Yes [provider]  Wheat Dextrin (BENEFIBER DRINK MIX PO) Take 1 Dose by mouth every other day.   Yes [provider]  cephALEXin (KEFLEX) 500 MG capsule Take 1 capsule (500 mg total) by mouth 3 (three) times daily for 7 days. 09/30/17 10/07/17  Merlyn Lot, MD  cyclobenzaprine (FLEXERIL) 10 MG tablet Take 1 tablet (10 mg total) by mouth 2 (two) times daily as needed for muscle spasms. 09/30/17   Merlyn Lot, MD    REVIEW OF SYSTEMS:  Review of Systems  Constitutional: Negative for chills, fever, malaise/fatigue and weight loss.  HENT: Negative for ear pain, hearing loss and tinnitus.   Eyes: Negative for blurred vision, double vision, pain and redness.  Respiratory: Negative for cough, hemoptysis and shortness of breath.   Cardiovascular: Negative for chest pain, palpitations, orthopnea and leg swelling.  Gastrointestinal: Negative for abdominal pain, constipation, diarrhea, nausea and vomiting.  Genitourinary: Negative for dysuria, frequency and hematuria.  Musculoskeletal: Negative for back pain, joint pain and neck pain.  Skin:       No acne, rash, or lesions  Neurological: Positive for sensory change and focal weakness. Negative for dizziness, tremors and weakness.  Endo/Heme/Allergies: Negative for polydipsia. Does not bruise/bleed easily.  Psychiatric/Behavioral: Negative for depression. The patient is not nervous/anxious and does not have insomnia.      VITAL SIGNS:   Vitals:   09/30/17 1355 09/30/17 1356 09/30/17 1648  BP: (!) 165/98    Pulse: 69    Resp: 20    Temp: 97.7 F (36.5 C)  97.8 F (36.6 C)  TempSrc: Oral    Weight:   98.4 kg (217 lb)   Height:  5\' 6"  (1.676 m)    Wt Readings from Last 3 Encounters:  09/30/17 98.4 kg (217 lb)  10/19/15 85.7 kg (189 lb)  10/18/15 85.7 kg (189 lb)    PHYSICAL EXAMINATION:  Physical Exam  Vitals reviewed. Constitutional: He is oriented to person, place, and time. He appears well-developed and well-nourished. No distress.  HENT:  Head: Normocephalic and atraumatic.  Mouth/Throat: Oropharynx is clear and moist.  Eyes: Conjunctivae and EOM are normal. Pupils are equal, round, and reactive to light. No scleral icterus.  Neck: Normal range of motion. Neck supple. No JVD present. No thyromegaly  present.  Cardiovascular: Normal rate, regular rhythm and intact distal pulses. Exam reveals no gallop and no friction rub.  No murmur heard. Respiratory: Effort normal and breath sounds normal. No respiratory distress. He has no wheezes. He has no rales.  GI: Soft. Bowel sounds are normal. He exhibits no distension. There is no tenderness.  Musculoskeletal: Normal range of motion. He exhibits no edema.  No arthritis, no gout  Lymphadenopathy:    He has no cervical adenopathy.  Neurological: He is alert and oriented to person, place, and time. No cranial nerve deficit.  Neurologic: Cranial nerves II-XII intact except for some very mild left lower facial droop, Sensation intact to light touch/pinprick except for endorsed left facial numbness to light touch, 5/5 strength in right sided extremities with left sided strength at 4/5, no dysarthria, no aphasia, no dysphagia, memory intact, positive for left-sided pronator drift   Skin: Skin is warm and dry. No rash noted. No erythema.  Psychiatric: He has a normal mood and affect. His behavior is normal. Judgment and thought content normal.    LABORATORY PANEL:   CBC Recent Labs  Lab 09/30/17 1435  WBC 5.2  HGB 14.2  HCT 43.7  PLT 293    ------------------------------------------------------------------------------------------------------------------  Chemistries  Recent Labs  Lab 09/30/17 1435  NA 136  K 5.1  CL 100*  CO2 27  GLUCOSE 120*  BUN 19  CREATININE 1.36*  CALCIUM 9.3   ------------------------------------------------------------------------------------------------------------------  Cardiac Enzymes Recent Labs  Lab 09/30/17 1435  TROPONINI <0.03   ------------------------------------------------------------------------------------------------------------------  RADIOLOGY:  Dg Chest 2 View  Result Date: 09/30/2017 CLINICAL DATA:  Chest pain and left shoulder pain, no known injury EXAM: CHEST  2 VIEW COMPARISON:  2/14/6 FINDINGS: Cardiac shadow is at the upper limits of normal in size. The lungs are well aerated bilaterally. Mild chronic changes are seen stable from the prior exam. No focal infiltrate or sizable effusion is noted. Old rib fractures are seen. Elevation the right hemidiaphragm is again noted. IMPRESSION: No acute abnormality noted. Electronically Signed   By: Inez Catalina M.D.   On: 09/30/2017 14:22   Dg Humerus Left  Result Date: 09/30/2017 CLINICAL DATA:  Left arm pain EXAM: LEFT HUMERUS - 2+ VIEW COMPARISON:  Shoulder x-ray 10/15/2014 FINDINGS: There is no evidence of fracture or other focal bone lesions. Soft tissues are unremarkable. IMPRESSION: Negative. Electronically Signed   By: Misty Stanley M.D.   On: 09/30/2017 15:42   Ct Angio Chest Aorta W And/or Wo Contrast  Result Date: 09/30/2017 CLINICAL DATA:  Patient with left arm and shoulder pain. EXAM: CT ANGIOGRAPHY CHEST, ABDOMEN AND PELVIS TECHNIQUE: Multidetector CT imaging through the chest, abdomen and pelvis was performed using the standard protocol during bolus administration of intravenous contrast. Multiplanar reconstructed images and MIPs were obtained and reviewed to evaluate the vascular anatomy. CONTRAST:  1109mL  ISOVUE-370 IOPAMIDOL (ISOVUE-370) INJECTION 76% COMPARISON:  CT chest 10/15/2014 FINDINGS: CTA CHEST FINDINGS Cardiovascular: Normal heart size. Trace pericardial fluid. Coronary arterial vascular calcifications. Thoracic aortic vascular calcifications. No focal area of peripheral high attenuation on noncontrast images within the thoracic aorta to suggest acute intramural hematoma. Post-contrast images demonstrate no acute thoracic aortic dissection. Ascending thoracic aorta measures 4 cm. Atherosclerotic narrowing at the origin of the left-greater-than-right vertebral arteries. Pulmonary artery is opacified. No evidence for central pulmonary embolus. Mediastinum/Nodes: No enlarged axillary, mediastinal or hilar lymphadenopathy. Normal esophagus. Lungs/Pleura: Central airways are patent. Dependent atelectasis within the bilateral lower lobes. Stable 2 mm nodule along the  left fissure (image 90; series 7). Dependent atelectasis within the right lower lobe. There is a 6 mm right lower lobe nodule (image 66; series 7). This is slightly increased in size from more remote exams however may be similar to more recent exam, but difficult to compare given differences in slice thickness. No pleural effusion or pneumothorax. Musculoskeletal: Re-demonstrated old posterior left rib fractures. Read demonstrated old posterior right rib fractures. Unchanged T7 wedge compression deformity. Review of the MIP images confirms the above findings. CTA ABDOMEN AND PELVIS FINDINGS VASCULAR Aorta: Peripheral calcified atherosclerotic plaque involving the normal caliber abdominal aorta. Celiac: The left gastric and right hepatic artery originate from a common trunk off of the aorta. Inferior and lateral is the origin of the splenic and left hepatic arteries, arising from a common trunk. SMA: Patent Renals: Single bilateral patent renal arteries IMA: Occluded. Inflow: Patent without evidence of aneurysm, dissection, vasculitis or significant  stenosis. Mild atherosclerotic narrowing at the origin of the femoral arteries bilaterally. Atherosclerotic plaque involving the profunda femoral arteries bilaterally. Veins: No obvious venous abnormality within the limitations of this arterial phase study. Review of the MIP images confirms the above findings. NON-VASCULAR Hepatobiliary: Liver is normal in size and contour. Small stones/sludge within the gallbladder lumen. No gallbladder wall thickening or pericholecystic fluid. Pancreas: Unremarkable Spleen: Unremarkable Adrenals/Urinary Tract: Unchanged 2.6 cm left adrenal nodule. Unchanged 1.8 cm right adrenal nodule. The kidneys are atrophic bilaterally and lobular in contour. No hydronephrosis. Stomach/Bowel: No abnormal bowel wall thickening or evidence for bowel obstruction. No free fluid or free intraperitoneal air. Normal morphology of the stomach. Normal appendix. Lymphatic: No retroperitoneal lymphadenopathy. Reproductive: Prostate is not well visualized due to streak artifact. Other: None. Musculoskeletal: Left hip arthroplasty. Lumbar spine degenerative changes. No aggressive or acute appearing osseous lesions. Review of the MIP images confirms the above findings. IMPRESSION: 1. No evidence for acute thoracic or abdominal aortic dissection. 2. No acute process within the chest, abdomen or pelvis. 3. Atrophic kidneys bilaterally. 4. There is a 6 mm right lower lobe pulmonary nodule which may be increased in size from more remote exams and is difficult to compare with recent exams due to differences in slice thickness however may be similar in size. Recommend follow-up chest CT in 6 months to ensure stability. 5. Atherosclerotic narrowing at the origin of the left greater than right vertebral arteries. Electronically Signed   By: Lovey Newcomer M.D.   On: 09/30/2017 16:55   Ct Renal Stone Study  Result Date: 09/30/2017 CLINICAL DATA:  Flank pain.  Suspected urolithiasis. EXAM: CT ABDOMEN AND PELVIS  WITHOUT CONTRAST TECHNIQUE: Multidetector CT imaging of the abdomen and pelvis was performed following the standard protocol without IV contrast. COMPARISON:  05/27/2011 FINDINGS: Lower chest: No acute findings. Hepatobiliary: No mass visualized on this unenhanced exam. Tiny calcified gallstones are noted, however there is no evidence of cholecystitis or biliary ductal dilatation. Pancreas: No mass or inflammatory process visualized on this unenhanced exam. Spleen:  Within normal limits in size. Adrenals/Urinary tract: Stable 2.6 cm left and 1.9 cm right adrenal adenomas are stable. No evidence of urolithiasis or hydronephrosis. Mild bilateral renal parenchymal scarring again seen. The unremarkable unopacified urinary bladder. Stomach/Bowel: No evidence of obstruction, inflammatory process, or abnormal fluid collections. Normal appendix visualized. Vascular/Lymphatic: No pathologically enlarged lymph nodes identified. No evidence of abdominal aortic aneurysm. Aortic atherosclerosis. Reproductive:  No mass or other significant abnormality. Other:  None. Musculoskeletal: No suspicious bone lesions identified. Left hip prosthesis noted. IMPRESSION: No  evidence of urolithiasis, hydronephrosis, or other acute findings. Stable mild bilateral renal parenchymal scarring. Gallstones are seen, however there is no evidence of cholecystitis or biliary dilatation. Stable small benign bilateral adrenal adenomas. Electronically Signed   By: Earle Gell M.D.   On: 09/30/2017 16:10   Ct Head Code Stroke Wo Contrast  Result Date: 09/30/2017 CLINICAL DATA:  Code stroke.  Right-sided weakness EXAM: CT HEAD WITHOUT CONTRAST TECHNIQUE: Contiguous axial images were obtained from the base of the skull through the vertex without intravenous contrast. COMPARISON:  Brain MRI 10/17/2014 FINDINGS: Brain: No evidence of acute infarction, hemorrhage, hydrocephalus, extra-axial collection or mass lesion/mass effect. Moderate remote lateral  right frontal infarct. Dense encephalomalacia of the bilateral inferior frontal lobes, posttraumatic pattern. Mild encephalomalacia in the right temporal pole. Advanced cortical atrophy, worse on the right where it is severe and progressed since 2016. Left cerebral atrophy is asymmetrically advanced at the vertex. 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 A proximal chronic high-grade stenosis is also considered. Small remote lateral left cerebellar infarct. Vascular: Atherosclerotic calcification.  No hyperdense vessel. Skull: Negative Sinuses/Orbits: Negative Other: These results were called by telephone at the time of interpretation on 09/30/2017 at 4:24 pm to Dr. Joni Fears , who verbally acknowledged these results. ASPECTS [facility] Stroke Program Early CT Score) -left hemisphere - Ganglionic level infarction (caudate, lentiform nuclei, internal capsule, insula, M1-M3 cortex): 7 - Supraganglionic infarction (M4-M6 cortex): 3 Total score (0-10 with 10 being normal): 10 IMPRESSION: 1. No hemorrhage or visible acute infarct. 2. Advanced cortical atrophy, particularly severe on the right, progressed from 2016-as above. 3. Bilateral inferior frontal and right temporal encephalomalacia, a posttraumatic pattern. 4. 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 Electronically Signed   By: Monte Fantasia M.D.   On: 09/30/2017 16:31   Ct Angio Abd/pel W And/or Wo Contrast  Result Date: 09/30/2017 CLINICAL DATA:  Patient with left arm and shoulder pain. EXAM: CT ANGIOGRAPHY CHEST, ABDOMEN AND PELVIS TECHNIQUE: Multidetector CT imaging through the chest, abdomen and pelvis was performed using the standard protocol during bolus administration of intravenous contrast. Multiplanar reconstructed images and MIPs were obtained and reviewed to evaluate the vascular anatomy. CONTRAST:  118mL ISOVUE-370 IOPAMIDOL  (ISOVUE-370) INJECTION 76% COMPARISON:  CT chest 10/15/2014 FINDINGS: CTA CHEST FINDINGS Cardiovascular: Normal heart size. Trace pericardial fluid. Coronary arterial vascular calcifications. Thoracic aortic vascular calcifications. No focal area of peripheral high attenuation on noncontrast images within the thoracic aorta to suggest acute intramural hematoma. Post-contrast images demonstrate no acute thoracic aortic dissection. Ascending thoracic aorta measures 4 cm. Atherosclerotic narrowing at the origin of the left-greater-than-right vertebral arteries. Pulmonary artery is opacified. No evidence for central pulmonary embolus. Mediastinum/Nodes: No enlarged axillary, mediastinal or hilar lymphadenopathy. Normal esophagus. Lungs/Pleura: Central airways are patent. Dependent atelectasis within the bilateral lower lobes. Stable 2 mm nodule along the left fissure (image 90; series 7). Dependent atelectasis within the right lower lobe. There is a 6 mm right lower lobe nodule (image 66; series 7). This is slightly increased in size from more remote exams however may be similar to more recent exam, but difficult to compare given differences in slice thickness. No pleural effusion or pneumothorax. Musculoskeletal: Re-demonstrated old posterior left rib fractures. Read demonstrated old posterior right rib fractures. Unchanged T7 wedge compression deformity. Review of the MIP images confirms the above findings. CTA ABDOMEN AND PELVIS FINDINGS VASCULAR Aorta: Peripheral calcified atherosclerotic plaque  involving the normal caliber abdominal aorta. Celiac: The left gastric and right hepatic artery originate from a common trunk off of the aorta. Inferior and lateral is the origin of the splenic and left hepatic arteries, arising from a common trunk. SMA: Patent Renals: Single bilateral patent renal arteries IMA: Occluded. Inflow: Patent without evidence of aneurysm, dissection, vasculitis or significant stenosis. Mild  atherosclerotic narrowing at the origin of the femoral arteries bilaterally. Atherosclerotic plaque involving the profunda femoral arteries bilaterally. Veins: No obvious venous abnormality within the limitations of this arterial phase study. Review of the MIP images confirms the above findings. NON-VASCULAR Hepatobiliary: Liver is normal in size and contour. Small stones/sludge within the gallbladder lumen. No gallbladder wall thickening or pericholecystic fluid. Pancreas: Unremarkable Spleen: Unremarkable Adrenals/Urinary Tract: Unchanged 2.6 cm left adrenal nodule. Unchanged 1.8 cm right adrenal nodule. The kidneys are atrophic bilaterally and lobular in contour. No hydronephrosis. Stomach/Bowel: No abnormal bowel wall thickening or evidence for bowel obstruction. No free fluid or free intraperitoneal air. Normal morphology of the stomach. Normal appendix. Lymphatic: No retroperitoneal lymphadenopathy. Reproductive: Prostate is not well visualized due to streak artifact. Other: None. Musculoskeletal: Left hip arthroplasty. Lumbar spine degenerative changes. No aggressive or acute appearing osseous lesions. Review of the MIP images confirms the above findings. IMPRESSION: 1. No evidence for acute thoracic or abdominal aortic dissection. 2. No acute process within the chest, abdomen or pelvis. 3. Atrophic kidneys bilaterally. 4. There is a 6 mm right lower lobe pulmonary nodule which may be increased in size from more remote exams and is difficult to compare with recent exams due to differences in slice thickness however may be similar in size. Recommend follow-up chest CT in 6 months to ensure stability. 5. Atherosclerotic narrowing at the origin of the left greater than right vertebral arteries. Electronically Signed   By: Lovey Newcomer M.D.   On: 09/30/2017 16:55    EKG:   Orders placed or performed during the hospital encounter of 09/30/17  . EKG 12-Lead  . EKG 12-Lead  . ED EKG within 10 minutes  . ED  EKG within 10 minutes    IMPRESSION AND PLAN:  Principal Problem:   Left-sided weakness -admit per stroke admission order set with appropriate imaging, labs, consults Active Problems:   Seizures ([place]) -continue home dose Keppra, seizure precautions   HTN (hypertension) -permissive hypertension tonight, blood pressure goal less than 220/120   CAD (coronary artery disease) -continue home medications   COPD (chronic obstructive pulmonary disease) ([place]) -continue home dose inhalers   Depression with anxiety -continue home meds  All the records are reviewed and case discussed with ED provider. Management plans discussed with the patient and/or family.  DVT PROPHYLAXIS: SubQ lovenox  GI PROPHYLAXIS: None  ADMISSION STATUS: Observation  CODE STATUS: Full Code Status History    This patient does not have a recorded code status. Please follow your organizational policy for patients in this situation.      TOTAL TIME TAKING CARE OF THIS PATIENT: 40 minutes.   Ryzen Deady [place] 09/30/2017, 9:07 PM  [employer]  [phone]  CC: Primary care physician; Lynnell Jude, MD  Note:  This document was prepared using Dragon voice recognition software and may include unintentional dictation errors.

## 2017-09-30 NOTE — ED Notes (Signed)
Notified ED provider of change in patient status - left sided paralysis, tingling and numbness, smile not symmetrical.

## 2017-09-30 NOTE — ED Notes (Signed)
Patient returned from CT

## 2017-09-30 NOTE — ED Notes (Signed)
Neurologist Dr. [name] on tele.

## 2017-09-30 NOTE — ED Notes (Signed)
Patient stated to Dr. [name] that his pain, tingling and numbness on his left side first occurred yesterday morning 09/29/2017

## 2017-09-30 NOTE — ED Notes (Signed)
Called CT and informed them patient will be on the way for scan to r/o stroke.

## 2017-09-30 NOTE — Progress Notes (Signed)
   09/30/17 1610  Clinical Encounter Type  Visited With Family  Visit Type Initial;Code  Referral From Physician   Patient already away from room; chaplain checked in with staff and patient family.  Chaplain to follow up this evening.

## 2017-09-30 NOTE — Progress Notes (Signed)
   09/30/17 1830  Clinical Encounter Type  Visited With Patient and family together  Visit Type Follow-up  Spiritual Encounters  Spiritual Needs Prayer;Emotional   Chaplain checked in with patient and family.  Patient shared thoughts regarding current health issues.  Chaplain offered prayer.

## 2017-09-30 NOTE — ED Triage Notes (Signed)
Pt to ED via EMS from [facility] c/o left arm and shoulder pain x1 week.  Hx stroke 3 years ago.  EMS VSS.

## 2017-09-30 NOTE — ED Provider Notes (Signed)
[facility] Emergency Department Provider Note    First MD Initiated Contact with Patient 09/30/17 1410     (approximate)  I have reviewed the triage vital signs and the nursing notes.   HISTORY  Chief Complaint Chest Pain    HPI Calvin [name] is a 67 y.o. male presents from [place] [facility] due to complaint of left arm and shoulder pain that is been going on for the past week.  No fevers.  Patient states he did have intermittent left leg pain as well.  States the pain is intermittent crampy in nature.  Intermittently achy.  States it is currently moderate.  Is not taking anything for this.  No fevers at home.  No chest pain or shortness of breath.  No abdominal pain.  Does have a history of gout.  Past Medical History:  Diagnosis Date  . Alcohol abuse    drinks on weekend  . Anemia   . Anxiety   . Arthritis   . Cancer ([place])    colon,throat  . COPD (chronic obstructive pulmonary disease) ([place])   . Coronary artery disease   . Depression   . Gout   . Hypertension   . Myocardial infarction ([place])   . Neuromuscular disorder ([place])   . Seizures ([place])    last 6 months ago  . Stroke [facility]    multiple  left side weakness  . Tremors of nervous system    History reviewed. No pertinent family history. Past Surgical History:  Procedure Laterality Date  . CARPAL TUNNEL RELEASE Left 10/19/2015   Procedure: CARPAL TUNNEL RELEASE;  Surgeon: Hessie Knows, MD;  Location: [facility] ORS;  Service: Orthopedics;  Laterality: Left;  . COLON SURGERY    . JOINT REPLACEMENT     left partial hip   . THROAT SURGERY  2013   cancer   There are no active problems to display for this patient.     Prior to Admission medications   Medication Sig Start Date End Date Taking? Authorizing Provider  acetaminophen (TYLENOL) 500 MG tablet Take 500 mg by mouth 3 (three) times daily.   Yes [provider]  ALPRAZolam Duanne Moron) 0.5 MG tablet Take 0.5 mg by mouth 3 (three) times  daily.    Yes [provider]  aspirin 81 MG tablet Take 81 mg by mouth daily.   Yes [provider]  gabapentin (NEURONTIN) 600 MG tablet Take 600 mg by mouth 3 (three) times daily.    Yes [provider]  HYDROcodone-acetaminophen (NORCO) 5-325 MG tablet Take 1 tablet by mouth every 6 (six) hours as needed for moderate pain. 10/19/15  Yes Hessie Knows, MD  levETIRAcetam (KEPPRA) 500 MG tablet Take 500 mg by mouth 2 (two) times daily.    Yes [provider]  nitroGLYCERIN (NITROSTAT) 0.4 MG SL tablet Place 0.4 mg under the tongue every 5 (five) minutes as needed for chest pain.   Yes [provider]  predniSONE (DELTASONE) 5 MG tablet Take 30 mg by mouth daily with breakfast.   Yes [provider]  sertraline (ZOLOFT) 25 MG tablet Take 25 mg by mouth daily.   Yes [provider]  simvastatin (ZOCOR) 10 MG tablet Take 10 mg by mouth daily.   Yes [provider]  ibuprofen (ADVIL,MOTRIN) 400 MG tablet Take 400 mg by mouth 3 (three) times daily. AS NEEDED    [provider]  Multiple Vitamin (THEREMS PO) Take 1 tablet by mouth daily.  [provider]  polyethylene glycol (MIRALAX / GLYCOLAX) packet Take 17 g by mouth daily.    [provider]  propranolol (INDERAL) 10 MG tablet Take 10 mg by mouth 3 (three) times daily.    [provider]  traZODone (DESYREL) 50 MG tablet Take 50 mg by mouth at bedtime. 0.5 TAB    [provider]    Allergies Patient has no known allergies.    Social History Social History   Tobacco Use  . Smoking status: Current Every Day Smoker  . Smokeless tobacco: Never Used  Substance Use Topics  . Alcohol use: Yes    Comment: weekends  . Drug use: No    Review of Systems Patient denies headaches, rhinorrhea, blurry vision, numbness, shortness of breath, chest pain, edema, cough, abdominal pain, nausea, vomiting, diarrhea, dysuria, fevers, rashes  or hallucinations unless otherwise stated above in HPI. ____________________________________________   PHYSICAL EXAM:  VITAL SIGNS: Vitals:   09/30/17 1355  BP: (!) 165/98  Pulse: 69  Resp: 20  Temp: 97.7 F (36.5 C)    Constitutional: Alert chronically ill appearing, in no acute distress. Eyes: Conjunctivae are normal.  Head: Atraumatic. Nose: No congestion/rhinnorhea. Mouth/Throat: Mucous membranes are moist.   Neck: No stridor. Painless ROM.  Cardiovascular: Normal rate, regular rhythm. Grossly normal heart sounds.  Good peripheral circulation. Respiratory: Normal respiratory effort.  No retractions. Lungs CTAB. Gastrointestinal: Soft and nontender. No distention. No abdominal bruits. No CVA tenderness. Musculoskeletal: No lower extremity tenderness nor edema.  ttp along left upper arm No joint effusions. Neurologic:  No gross focal deficits noted Skin:  Skin is warm, dry and intact. No rash noted.  ____________________________________________   LABS (all labs ordered are listed, but only abnormal results are displayed)  Results for orders placed or performed during the hospital encounter of 09/30/17 (from the past 24 hour(s))  Urinalysis, Complete w Microscopic     Status: Abnormal   Collection Time: 09/30/17  1:59 PM  Result Value Ref Range   Color, Urine STRAW (A) YELLOW   APPearance CLEAR (A) CLEAR   Specific Gravity, Urine 1.005 1.005 - 1.030   pH 7.0 5.0 - 8.0   Glucose, UA NEGATIVE NEGATIVE mg/dL   Hgb urine dipstick SMALL (A) NEGATIVE   Bilirubin Urine NEGATIVE NEGATIVE   Ketones, ur NEGATIVE NEGATIVE mg/dL   Protein, ur NEGATIVE NEGATIVE mg/dL   Nitrite NEGATIVE NEGATIVE   Leukocytes, UA SMALL (A) NEGATIVE   RBC / HPF 0-5 0 - 5 RBC/hpf   WBC, UA 6-30 0 - 5 WBC/hpf   Bacteria, UA RARE (A) NONE SEEN   Squamous Epithelial / LPF 0-5 (A) NONE SEEN  Basic metabolic panel     Status: Abnormal   Collection Time: 09/30/17  2:35 PM  Result Value Ref Range    Sodium 136 135 - 145 mmol/L   Potassium 5.1 3.5 - 5.1 mmol/L   Chloride 100 (L) 101 - 111 mmol/L   CO2 27 22 - 32 mmol/L   Glucose, Bld 120 (H) 65 - 99 mg/dL   BUN 19 6 - 20 mg/dL   Creatinine, Ser 1.36 (H) 0.61 - 1.24 mg/dL   Calcium 9.3 8.9 - 10.3 mg/dL   GFR calc non Af Amer 52 (L) >60 mL/min   GFR calc Af Amer >60 >60 mL/min   Anion gap 9 5 - 15  CBC     Status: None   Collection Time: 09/30/17  2:35 PM  Result Value Ref Range  WBC 5.2 3.8 - 10.6 K/uL   RBC 4.76 4.40 - 5.90 MIL/uL   Hemoglobin 14.2 13.0 - 18.0 g/dL   HCT 43.7 40.0 - 52.0 %   MCV 91.7 80.0 - 100.0 fL   MCH 29.9 26.0 - 34.0 pg   MCHC 32.6 32.0 - 36.0 g/dL   RDW 14.1 11.5 - 14.5 %   Platelets 293 150 - 440 K/uL  Troponin I     Status: None   Collection Time: 09/30/17  2:35 PM  Result Value Ref Range   Troponin I <0.03 <0.03 ng/mL   ____________________________________________  EKG My review and personal interpretation at Time: 13:55   Indication: left arm pain  Rate: 65  Rhythm: sinus Axis: normal  Other: no stemi, nonspecific st changes ____________________________________________  RADIOLOGY  I personally reviewed all radiographic images ordered to evaluate for the above acute complaints and reviewed radiology reports and findings.  These findings were personally discussed with the patient.  Please see medical record for radiology report.  ____________________________________________   PROCEDURES  Procedure(s) performed:  Procedures    Critical Care performed: no ____________________________________________   INITIAL IMPRESSION / ASSESSMENT AND PLAN / ED COURSE  Pertinent labs & imaging results that were available during my care of the patient were reviewed by me and considered in my medical decision making (see chart for details).  DDX: uti, acs, fracture, contusion, sprain, gout, stone, AAA, dissection   Calvin [name] is a 67 y.o. who presents to the ED with symptoms as described  above.  Patient in no acute distress but does appear chronically ill.  No evidence of infectious process.  No signs or symptoms to suggest acute septic arthritis.  Possibly gout.  No evidence of fracture.  Nothing to suggest pneumothorax, pneumonia or dissection.  He does have a history of ascending aortic aneurysm but no mediastinal widening and with equal pulses throughout.  Does not seem clinically consistent with aneurysmal pain.  Does have very foul-smelling odor on urine and does have rare bacteria with trace leukocytes therefore will treat with products.  Also has hematuria and patient also complaining of flank pain on reassessment.  Repeat abdominal exam is soft.  Does not seem clinically consistent with SBO or acute intra-abdominal process but will order CT imaging to exclude aneurysm and evaluate for ureterolithiasis.  Patient be signed out to oncoming physician pending CT imaging.  Anticipate discharge home.     ____________________________________________   FINAL CLINICAL IMPRESSION(S) / ED DIAGNOSES  Final diagnoses:  Acute pain of left shoulder  Cystitis      NEW MEDICATIONS STARTED DURING THIS VISIT:  New Prescriptions   No medications on file     Note:  This document was prepared using Dragon voice recognition software and may include unintentional dictation errors.    Merlyn Lot, MD 09/30/17 [phone]

## 2017-09-30 NOTE — ED Provider Notes (Addendum)
Clinical Course as of Oct 01 1947  Sun Sep 30, 2017  1514 RDW: 14.1 [PR]  1659 On returning to the tx room from CT renal stone study, pt had change in mental status, left-sided facial droop and left-sided weakness. Code Stroke was initiated for stat CT head.  Nih stroke scale = 4 initially.   Given that left-sided pain that he had been having earlier as well, concerning for aortic dissection so a CT angiogram of the chest abdomen pelvis was ordered as well. I'll see that result, CT angios unremarkable, no evidence of acute pathology in the chest abdomen pelvis, no evidence of aortic dissection. CT renal stone study also does not demonstrate any ureterolithiasis on the noncontrast scan.  [PS]  1822 Discussed with neurologist. Finds exam to be nonfocal but NIH stroke scale of 2. Recommends hospitalization for further stroke workup at this time. Van screen negative. Unclear time of onset sometime within the last 1 or 2 days despite his seemingly worsened symptoms here in the ED.  [PS]  1921 Discussed with daughter regarding results. She does note that in the past when I had similar symptoms was because he was having exacerbation of his underlying seizure disorder in the setting of a UTI and that they had to increase his Keppra to get things under control. This may be the case today, but with his strokelike symptoms, we need to proceed with a stroke workup while continuing to treat UTI. We'll discuss with the hospitalist.  [PS]  1945 Discussed with hospitalist for further management  [PS]    Clinical Course User Index [PR] Merlyn Lot, MD [PS] Carrie Mew, MD   .Critical Care Performed by: Carrie Mew, MD Authorized by: Carrie Mew, MD   Critical care provider statement:    Critical care time (minutes):  30   Critical care time was exclusive of:  Separately billable procedures and treating other patients   Critical care was necessary to treat or prevent imminent or  life-threatening deterioration of the following conditions:  CNS failure or compromise   Critical care was time spent personally by me on the following activities:  Development of treatment plan with patient or surrogate, discussions with consultants, evaluation of patient's response to treatment, examination of patient, obtaining history from patient or surrogate, ordering and performing treatments and interventions, ordering and review of laboratory studies, ordering and review of radiographic studies, pulse oximetry, re-evaluation of patient's condition and review of old charts      Final diagnoses:  Acute pain of left shoulder  Cystitis  Cerebrovascular accident (CVA), unspecified mechanism ([place])  Seizure disorder [facility])      Carrie Mew, MD 09/30/17 Marjo Bicker    Carrie Mew, MD 09/30/17 1949

## 2017-09-30 NOTE — ED Notes (Signed)
Patient transported to CT 

## 2017-09-30 NOTE — ED Notes (Signed)
Patient reports he needs to have a bowel movement. Patient refusing bedpan and requesting to wear incontinence brief. Brief applied. Patient given call bell and instructed to alert RN if able to have a BM. Patient verbalized understanding of instructions.

## 2017-09-30 NOTE — ED Notes (Signed)
Patient reports he has still not been able to have a bowel movement.

## 2017-09-30 NOTE — ED Notes (Signed)
Patient provided urinal. Patient had 450 mL urine output

## 2017-09-30 NOTE — ED Notes (Signed)
Admitting MD at bedside.

## 2017-09-30 NOTE — Consult Note (Addendum)
[facility] TeleNeurology Consult Services  Asked to see this patient in telemedicine consultation. Consultation was performed with assistance of ancillary/medical staff at bedside.  Comments: Last Known normal  yesterday Door Time: 5038 [facility] Contacted: 8828 [facility] first log in: 1802 NIHSS assessment time: 0034 Needle Time:  No iv tpa Call back time: 1816  HPI:  84 yom with complaint of chest pain yesterday and today presented to ED with LUE pain and chest pain. While in ED he complains of numbness on his left side as well. He states similar sxs last night and can't recall when it resolved but states it was gone this am and returned. He has hx of tremors/ seizures and is on keppra/gabapentin and also takes asa.   VSS  Gen Wn/Wd in Nad  TeleStroke Assessment: LOC:   0 LOC questions:  0 LOC Commands :   0 Gaze : 0 Visual fields :  0  Facial movements : 0 Upper limb Motor  0 Lower limb Motor  0 Limb Coordination  - 0 Sensory -  - 1 Language -  0 Speech -   0 Neglect / extinction -  0  NIHSS Score: 1   CT SCAN HEAD  IMPRESSION: 1. No hemorrhage or visible acute infarct. 2. Advanced cortical atrophy, particularly severe on the right, progressed from 2016-as above. 3. Bilateral inferior frontal and right temporal encephalomalacia, a posttraumatic pattern. 4. 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     IMPRESSION  TIA vs small R lacunar stroke    Medical Decision Making:   Patient is not candidate for alteplase due to nondisabling sxs, onset now greater than 4.5hrs  Not an IR candidate as low clinical suspicion for LVO by neurologic assessment.   Recommendations: - Daily antithrombotics to initiate now if no contraindication.  - Further work up with Stroke labs, MRI brain, ECHO, NIVS Carotid will be deferred to inpt neurology service -  Needs Inpatient Neurology consultation and follow  up  - Thank you for allowing [place] to participate in the care of your patient, if there are any questions please don't hesitate to contact us  Discussed plan of care with patient/hospital staff   Physician: Sylvan Cheese, DO   [facility]

## 2017-10-01 ENCOUNTER — Observation Stay (HOSPITAL_BASED_OUTPATIENT_CLINIC_OR_DEPARTMENT_OTHER)
Admit: 2017-10-01 | Discharge: 2017-10-01 | Disposition: A | Discharge status: Home / Self Care | Payer: Medicare Other | Attending: Internal Medicine | Admitting: Internal Medicine

## 2017-10-01 ENCOUNTER — Observation Stay: Payer: Medicare Other

## 2017-10-01 DIAGNOSIS — R531 Weakness: Secondary | ICD-10-CM

## 2017-10-01 DIAGNOSIS — I517 Cardiomegaly: Secondary | ICD-10-CM

## 2017-10-01 DIAGNOSIS — I6523 Occlusion and stenosis of bilateral carotid arteries: Secondary | ICD-10-CM | POA: Diagnosis not present

## 2017-10-01 LAB — LIPID PANEL
CHOL/HDL RATIO: 2.5 ratio
Cholesterol: 144 mg/dL (ref 0–200)
HDL: 58 mg/dL (ref >40)
LDL CALC: 70 mg/dL (ref 0–99)
TRIGLYCERIDES: 82 mg/dL (ref <150)
VLDL: 16 mg/dL (ref 0–40)

## 2017-10-01 LAB — ECHOCARDIOGRAM COMPLETE
Height: 71 in
Weight: 3389.79 oz

## 2017-10-01 LAB — HEMOGLOBIN A1C
HEMOGLOBIN A1C: 6.2 % — AB (ref 4.8–5.6)
Mean Plasma Glucose: 131.24 mg/dL

## 2017-10-01 LAB — MRSA PCR SCREENING: MRSA BY PCR: NEGATIVE

## 2017-10-01 MED ORDER — GABAPENTIN 400 MG PO CAPS
800.0000 mg | ORAL_CAPSULE | Freq: Three times a day (TID) | ORAL | Status: DC
  Administered 2017-10-01 – 2017-10-02 (×4): 800 mg via ORAL
  Filled 2017-10-01 (×4): qty 2

## 2017-10-01 MED ORDER — SODIUM CHLORIDE 0.9 % IV SOLN
INTRAVENOUS | Status: DC
  Administered 2017-10-01 – 2017-10-02 (×2): via INTRAVENOUS

## 2017-10-01 NOTE — Progress Notes (Addendum)
SLP Cancellation Note  Patient Details Name: Calvin [name] MRN: [id number] DOB: [birth year]   Cancelled treatment:       Reason Eval/Treat Not Completed: SLP screened, no needs identified, will sign off(chart reviewed; consulted NSG then met w/ pt). Pt denied any difficulty swallowing and is currently on a regular diet; tolerates swallowing pills w/ water per NSG. Pt conversed at conversational level w/out significant deficits noted; pt denied any language deficits and conversed w/ SLP indicating his wants/needs. Noted pt's speech is decreased in articulation and precision impacting his intelligibility (slightly) w/ certain speech sounds - pt is Edentulous and suspect this is impacting his articulation ability. No further skilled ST services indicated as pt appears at his baseline. Pt agreed. NSG to reconsult if any change in status while admitted.     Orinda Kenner, MS, CCC-SLP Watson,Katherine 10/01/2017, 11:29 AM

## 2017-10-01 NOTE — Progress Notes (Signed)
OT Cancellation Note  Patient Details Name: SANDIP POWER MRN: [id number] DOB: 1950/12/26   Cancelled Treatment:    Reason Eval/Treat Not Completed: Patient at procedure or test/ unavailable. On 2nd attempt, pt back in room, working with PT. Will re-attempt next date as pt is available and medically appropriate.   Jeni Salles, MPH, MS, OTR/L ascom [phone] 10/01/17, 4:00 PM

## 2017-10-01 NOTE — Evaluation (Signed)
Physical Therapy Evaluation Patient Details Name: Calvin [name] MRN: [id number] DOB: [birth year] Today's Date: 10/01/2017   History of Present Illness  Pt is a 67 y.o.malewho presents with left arm pain and subsequent left arm and leg weakness with left facial numbness. Initial exam in the ED is consistent with possible stroke. Hospitalist called for admission and further evaluation.  Assessment includes: L-sided weakness, seizures, HTN, CAD, COPD, previous CVA with L-sided deficits, and depression/anxiety.      Clinical Impression  Pt presents with min deficits in strength, transfers, mobility, gait, balance, and activity tolerance but overall did well during this PT evaluation.  Pt was Mod Ind with sup to/from sit with extra time and effort required but no physical assistance needed.  Pt was CGA with transfers with good eccentric and concentric control and good stability upon initial stand.  Pt able to amb 40' with RW and CGA/SBA with slow cadence and short B step length but was steady without LOB.  Pt did present with deficits to light touch on the L side of his face and LUE but reports that as mostly chronic from his previous stroke.  Pt presents with deficits in LUE fine motor control/coordination per below that pt reports is chronic but that seem slightly more impaired than baseline.  Sensation to light touch was grossly intact and equal L vs R in the lower extremities with slight deficits in gross coordination and strength on the L side compared to the right that again pt reports as chronic but slightly more impaired than baseline.  Pt will benefit from a likely short duration of PT services in his SNF setting upon discharge to safely address above deficits for decreased caregiver assistance and eventual return to PLOF.        Follow Up Recommendations SNF    Equipment Recommendations  None recommended by PT    Recommendations for Other Services       Precautions / Restrictions  Precautions Precautions: Fall;Other (comment) Precaution Comments: h/o seizures Restrictions Weight Bearing Restrictions: No      Mobility  Bed Mobility Overal bed mobility: Modified Independent             General bed mobility comments: Extra time and effort required but no physical assistance with sup to/from sit  Transfers Overall transfer level: Needs assistance Equipment used: Rolling walker (2 wheeled) Transfers: Sit to/from Stand Sit to Stand: Min guard         General transfer comment: Extra time and effort required with min verbal cues for sequencing but no physical assistance with good stability upon initial stand  Ambulation/Gait Ambulation/Gait assistance: Min guard Ambulation Distance (Feet): 40 Feet Assistive device: Rolling walker (2 wheeled) Gait Pattern/deviations: Step-through pattern;Decreased step length - right;Decreased step length - left   Gait velocity interpretation: Below normal speed for age/gender General Gait Details: Slow cadence with amb with RW with short B step length but steady without LOB  Stairs            Wheelchair Mobility    Modified Rankin (Stroke Patients Only)       Balance Overall balance assessment: Needs assistance Sitting-balance support: No upper extremity supported;Feet supported Sitting balance-Leahy Scale: Good     Standing balance support: Bilateral upper extremity supported;During functional activity;Single extremity supported Standing balance-Leahy Scale: Good Standing balance comment: Pt did not rely heavily on UE support during standing activities and presented with good stability overall  Pertinent Vitals/Pain Pain Assessment: 0-10 Pain Score: 8  Pain Location: L arm Pain Descriptors / Indicators: Aching;Sore Pain Intervention(s): Premedicated before session;Monitored during session;Limited activity within patient's tolerance    Home Living  Family/patient expects to be discharged to:: Other (Comment)([facility] facility)               Home Equipment: Gilford Rile - 2 wheels;Walker - 4 wheels      Prior Function Level of Independence: Needs assistance   Gait / Transfers Assistance Needed: Mod Ind amb facility distances with RW, used rollator in the community when daughter takes him out, one fall in the last year when brake wasn't locked on rollator while reaching outside BOS  ADL's / Homemaking Assistance Needed: Pt reports staff at [facility] facility assists with meals and meds and limited dressing but mostly Ind with bathing and dressing        Hand Dominance        Extremity/Trunk Assessment   Upper Extremity Assessment Upper Extremity Assessment: Defer to OT evaluation;LUE deficits/detail LUE Deficits / Details: Decreased fine motor skills with FTN and sequential thumb to finger opposition that pt reports is slightly worse than previous baseline LUE Sensation: (Decreased sensation to light touch that pt reports as chronic with no change) LUE Coordination: decreased fine motor;decreased gross motor(Minimal increased deficits compared to baseline)    Lower Extremity Assessment Lower Extremity Assessment: LLE deficits/detail LLE Deficits / Details: L knee flex and ext, L hip flex, and L ankle DF 4/5 with pt reporting feeling LLE slightly weaker than at baseline; RLE strength and sensation WNL LLE Sensation: (Sensation to light touch equal L vs RLE) LLE Coordination: decreased gross motor       Communication   Communication: No difficulties  Cognition Arousal/Alertness: Awake/alert Behavior During Therapy: WFL for tasks assessed/performed Overall Cognitive Status: Within Functional Limits for tasks assessed                                        General Comments      Exercises Total Joint Exercises Ankle Circles/Pumps: AROM;Both;10 reps Heel Slides: AROM;Both;10 reps Hip ABduction/ADduction:  AROM;Both;5 reps Straight Leg Raises: AROM;Both;5 reps Long Arc Quad: Both;10 reps;Strengthening Knee Flexion: Strengthening;Both;10 reps Marching in Standing: AROM;Both;10 reps;Seated;Standing   Assessment/Plan    PT Assessment Patient needs continued PT services  PT Problem List Decreased strength;Decreased activity tolerance;Decreased balance       PT Treatment Interventions Gait training;Functional mobility training;Neuromuscular re-education;Balance training;Therapeutic exercise;Therapeutic activities;Patient/family education;DME instruction    PT Goals (Current goals can be found in the Care Plan section)  Acute Rehab PT Goals Patient Stated Goal: To get back to walking like I used to with my daughter in the community PT Goal Formulation: With patient Time For Goal Achievement: 10/14/17 Potential to Achieve Goals: Good    Frequency Min 2X/week   Barriers to discharge        Co-evaluation               AM-PAC PT "6 Clicks" Daily Activity  Outcome Measure Difficulty turning over in bed (including adjusting bedclothes, sheets and blankets)?: A Little Difficulty moving from lying on back to sitting on the side of the bed? : A Little Difficulty sitting down on and standing up from a chair with arms (e.g., wheelchair, bedside commode, etc,.)?: A Little Help needed moving to and from a bed to chair (including a wheelchair)?:  A Little Help needed walking in hospital room?: A Little Help needed climbing 3-5 steps with a railing? : A Little 6 Click Score: 18    End of Session Equipment Utilized During Treatment: Gait belt Activity Tolerance: Patient tolerated treatment well Patient left: in bed;with bed alarm set;with call bell/phone within reach Nurse Communication: Mobility status PT Visit Diagnosis: Difficulty in walking, not elsewhere classified (R26.2);Muscle weakness (generalized) (M62.81)    Time: 6381-7711 PT Time Calculation (min) (ACUTE ONLY): 38  min   Charges:   PT Evaluation $PT Eval Low Complexity: 1 Low PT Treatments $Therapeutic Exercise: 8-22 mins   PT G Codes:        DRoyetta Asal PT, DPT 10/01/17, 5:13 PM

## 2017-10-01 NOTE — NC FL2 (Signed)
[place] LEVEL OF CARE SCREENING TOOL     IDENTIFICATION  [name] Birthdate: 1951/06/04 Sex: male Admission Date (Current Location): 09/30/2017  [facility] and [place] Number:  Engineering geologist and Address:  [facility], [address] [place], [place] [address]      Provider Number: [id number]  Attending Physician Name and Address:  Max Sane, MD  Relative Name and Phone Number:       Current Level of Care: Hospital Recommended Level of Care: [place] Prior Approval Number:    Date Approved/Denied:   PASRR Number:    Discharge Plan: SNF    Current Diagnoses: Patient Active Problem List   Diagnosis Date Noted  . Left-sided weakness 09/30/2017  . Seizures ([place]) 09/30/2017  . HTN (hypertension) 09/30/2017  . CAD (coronary artery disease) 09/30/2017  . COPD (chronic obstructive pulmonary disease) ([place]) 09/30/2017  . Depression with anxiety 09/30/2017    Orientation RESPIRATION BLADDER Height & Weight     Self, Time, Situation, Place  Normal Continent Weight: 211 lb 13.8 oz (96.1 kg) Height:  5\' 11"  (180.3 cm)  BEHAVIORAL SYMPTOMS/MOOD NEUROLOGICAL BOWEL NUTRITION STATUS      Continent Diet  AMBULATORY STATUS COMMUNICATION OF NEEDS Skin   Extensive Assist Verbally Normal                       Personal Care Assistance Level of Assistance  Bathing, Feeding, Dressing Bathing Assistance: Limited assistance Feeding assistance: Limited assistance Dressing Assistance: Limited assistance     Functional Limitations Info  Sight, Hearing, Speech Sight Info: Adequate Hearing Info: Adequate Speech Info: Adequate    SPECIAL CARE FACTORS FREQUENCY  PT (By licensed PT), OT (By licensed OT)     PT Frequency: 5x OT Frequency: 5x            Contractures Contractures Info: Not present    Additional Factors Info  Code Status, Allergies Code Status Info: Full Code Allergies  Info: NKA           Current Medications (10/01/2017):  This is the current hospital active medication list Current Facility-Administered Medications  Medication Dose Route Frequency Provider Last Rate Last Dose  . acetaminophen (TYLENOL) tablet 650 mg  650 mg Oral Q4H PRN Lance Coon, MD   650 mg at 09/30/17 2334   Or  . acetaminophen (TYLENOL) solution 650 mg  650 mg Per Tube Q4H PRN Lance Coon, MD       Or  . acetaminophen (TYLENOL) suppository 650 mg  650 mg Rectal Q4H PRN Lance Coon, MD      . ALPRAZolam Duanne Moron) tablet 0.5 mg  0.5 mg Oral TID Lance Coon, MD   0.5 mg at 10/01/17 1010  . aspirin chewable tablet 81 mg  81 mg Oral Daily Lance Coon, MD   81 mg at 10/01/17 1010  . enoxaparin (LOVENOX) injection 40 mg  40 mg Subcutaneous Q24H Lance Coon, MD      . gabapentin (NEURONTIN) tablet 600 mg  600 mg Oral TID Lance Coon, MD   600 mg at 10/01/17 1011  . levETIRAcetam (KEPPRA) tablet 500 mg  500 mg Oral BID Lance Coon, MD   500 mg at 10/01/17 1011  . oxyCODONE (Oxy IR/ROXICODONE) immediate release tablet 5 mg  5 mg Oral Q4H PRN Lance Coon, MD   5 mg at 10/01/17 1011  . pantoprazole (PROTONIX) EC tablet 40 mg  40 mg Oral Daily  Lance Coon, MD   40 mg at 10/01/17 1011  . sertraline (ZOLOFT) tablet 25 mg  25 mg Oral Daily Lance Coon, MD   25 mg at 10/01/17 1011  . simvastatin (ZOCOR) tablet 10 mg  10 mg Oral Daily Lance Coon, MD   10 mg at 10/01/17 1010  . tiotropium (SPIRIVA) inhalation capsule 18 mcg  18 mcg Inhalation Daily Lance Coon, MD   18 mcg at 10/01/17 1011     Discharge Medications: Please see discharge summary for a list of discharge medications.  Relevant Imaging Results:  Relevant Lab Results:   Additional Information SSN: [id number]  [name], [place]

## 2017-10-01 NOTE — Clinical Social Work Note (Signed)
Clinical Social Work Assessment  Patient Details  Name: Calvin [name] MRN: [id number] Date of Birth: 1951/02/24  Date of referral:  10/01/17               Reason for consult:  Discharge Planning                Permission sought to share information with:  Case Manager, Facility Sport and exercise psychologist, Family Supports Permission granted to share information::  Yes, Verbal Permission Granted  Name::        Agency::  [facility], [facility] (nursing home)  Relationship::  Calvin [name], Daughter  Contact Information:     Housing/Transportation Living arrangements for the past 2 months:  [place] of Information:  Patient, Medical Team, Adult Children, Facility Patient Interpreter Needed:  None Criminal Activity/Legal Involvement Pertinent to Current Situation/Hospitalization:  No - Comment as needed Significant Relationships:  Adult Children, Warehouse manager Lives with:  Facility Resident Do you feel safe going back to the place where you live?  Yes Need for family participation in patient care:  Yes (Comment)  Care giving concerns:  Patient admits to [facility] for left sided weakness, stroke like symptoms.  Patient currently has neuro following for stroke work up.   Patient is from [place] [place] facility where he is a long term care resident at this time.  Patient's facility updated regarding admission and current needs. No concerns with patient returning at discharge. LCSW will have PT consult placed for SNF benefit for rehab after dc if appropriate.  Patient has past stroke.     Social Worker assessment / plan:  Assessment completed and discussion with facility regarding admission.  Call placed to patient's daughter who agrees with plan for return. She reports she will be up to see patient later today after work.    Plan: Anticipate return to [facility] at discharge. Will ask for PT consult for possible benefit of SNF at [place] facility.    Employment status:   Retired Nurse, adult, Medicaid In [place] PT Recommendations:  Not assessed at this time(anticipate return for SNF) Information / Referral to community resources:     Patient/Family's Response to care:  Daughter agreeable and appreciative of update and plan. She is awaiting to hear stroke work up results and reports no one has called her.  Patient/Family's Understanding of and Emotional Response to Diagnosis, Current Treatment, and Prognosis:  Daughter aware of current reason for admission and appreciative of information.  Emotional Assessment Appearance:  Appears stated age Attitude/Demeanor/Rapport:    Affect (typically observed):  Accepting, Adaptable Orientation:  Oriented to Self, Oriented to Place, Oriented to  Time, Oriented to Situation Alcohol / Substance use:  Not Applicable Psych involvement (Current and /or in the community):  No (Comment)  Discharge Needs  Concerns to be addressed:  Denies Needs/Concerns at this time Readmission within the last 30 days:  No Current discharge risk:  None Barriers to Discharge:  Continued Medical Work up   [name], LCSW 10/01/2017, 10:05 AM

## 2017-10-01 NOTE — Consult Note (Signed)
Referring Physician: Manuella Ghazi    Chief Complaint: Left sided weakness and pain  HPI: Calvin [name] is an 67 y.o. male with a history of stroke in the past affecting the left side who reports that approximately 2 weeks ago he began to have left-sided pain and numbness leading to left-sided weakness.  He reports that he was not taken seriously at his facility.  But was brought in on the day of presentation at the assistance of his daughter.  The chart actually documents something different.  It seems that the patient presented with chest pain with radiation into the left arm and the neck.  It appears that while in the ED the patient had a neurological change and was felt to then develop left-sided weakness and numbness.  Code stroke was called at that time.  Dissection was ruled out.  It was determined at that time that he was not a TPA candidate.  Patient on aspirin prior to presentation.  Initial NIH stroke scale of 4.  Date last known well: Unable to determine Time last known well: Unable to determine tPA Given: No: Unable to determine LKW  Past Medical History:  Diagnosis Date  . Alcohol abuse    drinks on weekend  . Anemia   . Anxiety   . Arthritis   . Cancer ([place])    colon,throat  . COPD (chronic obstructive pulmonary disease) ([place])   . Coronary artery disease   . Depression   . Gout   . Hypertension   . Myocardial infarction ([place])   . Neuromuscular disorder ([place])   . Seizures ([place])    last 6 months ago  . Stroke [facility])    multiple  left side weakness  . Tremors of nervous system     Past Surgical History:  Procedure Laterality Date  . CARPAL TUNNEL RELEASE Left 10/19/2015   Procedure: CARPAL TUNNEL RELEASE;  Surgeon: Hessie Knows, MD;  Location: [facility] ORS;  Service: Orthopedics;  Laterality: Left;  . COLON SURGERY    . JOINT REPLACEMENT     left partial hip   . THROAT SURGERY  2013   cancer    Family History  Problem Relation Age of Onset  . Cancer Mother   .  Hypertension Father   . Leukemia Brother    Social History:  reports that he has been smoking.  he has never used smokeless tobacco. He reports that he drinks alcohol. He reports that he does not use drugs.  Allergies: No Known Allergies  Medications:  I have reviewed the patient's current medications. Prior to Admission:  Medications Prior to Admission  Medication Sig Dispense Refill Last Dose  . acetaminophen (TYLENOL) 500 MG tablet Take 500 mg by mouth 3 (three) times daily.   09/30/2017 at 0800  . ALPRAZolam (XANAX) 0.5 MG tablet Take 0.5 mg by mouth 3 (three) times daily.    09/30/2017 at 0800  . Alum & Mag Hydroxide-Simeth (MYLANTA PO) Take 30 mLs by mouth every 12 (twelve) hours as needed.   PRN at PRN  . aspirin 81 MG tablet Take 81 mg by mouth daily.   09/30/2017 at 0800  . ergocalciferol (VITAMIN D2) 50000 units capsule Take 50,000 Units by mouth every 30 (thirty) days.   09/20/2017 at 0800  . gabapentin (NEURONTIN) 600 MG tablet Take 600 mg by mouth 3 (three) times daily.    09/30/2017 at 0800  . guaifenesin (ROBITUSSIN) 100 MG/5ML syrup Take 200 mg by mouth 3 (three) times daily.  09/30/2017 at 0800  . HYDROcodone-acetaminophen (NORCO) 5-325 MG tablet Take 1 tablet by mouth every 6 (six) hours as needed for moderate pain. 30 tablet 0 prn at prn  . levETIRAcetam (KEPPRA) 500 MG tablet Take 500 mg by mouth 2 (two) times daily.    09/30/2017 at 0800  . lisinopril (PRINIVIL,ZESTRIL) 5 MG tablet Take 7.5 mg by mouth daily.   09/30/2017 at Unknown time  . nicotine polacrilex (NICORETTE) 2 MG gum Take 2 mg by mouth every 4 (four) hours as needed for smoking cessation.   PRN at PRN  . nitroGLYCERIN (NITROSTAT) 0.4 MG SL tablet Place 0.4 mg under the tongue every 5 (five) minutes as needed for chest pain.   09/30/2017 at [place]  . omeprazole (PRILOSEC) 20 MG capsule Take 20 mg by mouth daily.   09/30/2017 at 0800  . ondansetron (ZOFRAN) 4 MG tablet Take 4 mg by mouth every 8 (eight) hours as needed  for nausea or vomiting.   PRN at PRN  . polyethylene glycol (MIRALAX / GLYCOLAX) packet Take 17 g by mouth every other day.    09/29/2017 at 0800  . predniSONE (DELTASONE) 5 MG tablet Take 30 mg by mouth daily with breakfast.   09/30/2017 at 0800  . sertraline (ZOLOFT) 25 MG tablet Take 25 mg by mouth daily.   09/30/2017 at Unknown time  . simvastatin (ZOCOR) 10 MG tablet Take 10 mg by mouth daily.   09/29/2017 at 2000  . tiotropium (SPIRIVA) 18 MCG inhalation capsule Place 18 mcg into inhaler and inhale daily.   09/30/2017 at 0800  . Wheat Dextrin (BENEFIBER DRINK MIX PO) Take 1 Dose by mouth every other day.   09/30/2017 at 0800   Scheduled: . ALPRAZolam  0.5 mg Oral TID  . aspirin  81 mg Oral Daily  . enoxaparin (LOVENOX) injection  40 mg Subcutaneous Q24H  . gabapentin  600 mg Oral TID  . levETIRAcetam  500 mg Oral BID  . pantoprazole  40 mg Oral Daily  . sertraline  25 mg Oral Daily  . simvastatin  10 mg Oral Daily  . tiotropium  18 mcg Inhalation Daily    ROS: History obtained from the patient  General ROS: negative for - chills, fatigue, fever, night sweats, weight gain or weight loss Psychological ROS: negative for - behavioral disorder, hallucinations, memory difficulties, mood swings or suicidal ideation Ophthalmic ROS: negative for - blurry vision, double vision, eye pain or loss of vision ENT ROS: negative for - epistaxis, nasal discharge, oral lesions, sore throat, tinnitus or vertigo Allergy and Immunology ROS: negative for - hives or itchy/watery eyes Hematological and Lymphatic ROS: negative for - bleeding problems, bruising or swollen lymph nodes Endocrine ROS: negative for - galactorrhea, hair pattern changes, polydipsia/polyuria or temperature intolerance Respiratory ROS: negative for - cough, hemoptysis, shortness of breath or wheezing Cardiovascular ROS: chest pain Gastrointestinal ROS: negative for - abdominal pain, diarrhea, hematemesis, nausea/vomiting or stool  incontinence Genito-Urinary ROS: negative for - dysuria, hematuria, incontinence or urinary frequency/urgency Musculoskeletal ROS: left sided pain Neurological ROS: as noted in HPI Dermatological ROS: negative for rash and skin lesion changes  Physical Examination: Blood pressure 117/61, pulse 74, temperature (!) 97.5 F (36.4 C), resp. rate 20, height 5\' 11"  (1.803 m), weight 96.1 kg (211 lb 13.8 oz), SpO2 100 %.  HEENT-  Normocephalic, no lesions, without obvious abnormality.  Normal external eye and conjunctiva.  Normal TM's bilaterally.  Normal auditory canals and external ears. Normal external nose, mucus  membranes and septum.  Normal pharynx. Cardiovascular- S1, S2 normal, pulses palpable throughout   Lungs- chest clear, no wheezing, rales, normal symmetric air entry Abdomen- soft, non-tender; bowel sounds normal; no masses,  no organomegaly Extremities- no edema Lymph-no adenopathy palpable Musculoskeletal-no joint tenderness, deformity or swelling Skin-warm and dry, no hyperpigmentation, vitiligo, or suspicious lesions  Neurological Examination   Mental Status: Alert, oriented, thought content appropriate.  Speech fluent without evidence of aphasia.  Able to follow 3 step commands without difficulty. Cranial Nerves: II: Discs flat bilaterally; LHH, pupils equal, round, reactive to light and accommodation III,IV, VI: ptosis not present, extra-ocular motions intact bilaterally V,VII: left facial droop, facial light touch sensation impaired on the left VIII: hearing normal bilaterally IX,X: gag reflex present XI: bilateral shoulder shrug XII: midline tongue extension Motor: Right : Upper extremity   5/5    Left:     Upper extremity   3+/5  Lower extremity   5/5     Lower extremity   3/5 Will not allow left to be tested fully due to pain Sensory: Pinprick and light touch decreased on the LLE.  Hyperesthetic on the LUE Deep Tendon Reflexes: Unable to test due to  pain Plantars: Right: mute   Left: mute Cerebellar: Normal finger-to-nose testing bilaterally.  Not tested in the lower extremities due to pain Gait: not tested due to safety concerns   Laboratory Studies:  Basic Metabolic Panel: Recent Labs  Lab 09/30/17 1435  NA 136  K 5.1  CL 100*  CO2 27  GLUCOSE 120*  BUN 19  CREATININE 1.36*  CALCIUM 9.3    Liver Function Tests: No results for input(s): AST, ALT, ALKPHOS, BILITOT, PROT, ALBUMIN in the last 168 hours. No results for input(s): LIPASE, AMYLASE in the last 168 hours. No results for input(s): AMMONIA in the last 168 hours.  CBC: Recent Labs  Lab 09/30/17 1435  WBC 5.2  HGB 14.2  HCT 43.7  MCV 91.7  PLT 293    Cardiac Enzymes: Recent Labs  Lab 09/30/17 1435  TROPONINI <0.03    BNP: Invalid input(s): POCBNP  CBG: No results for input(s): GLUCAP in the last 168 hours.  Microbiology: Results for orders placed or performed during the hospital encounter of 09/30/17  MRSA PCR Screening     Status: None   Collection Time: 09/30/17 10:58 PM  Result Value Ref Range Status   MRSA by PCR NEGATIVE NEGATIVE Final    Comment:        The GeneXpert MRSA Assay (FDA approved for NASAL specimens only), is one component of a comprehensive MRSA colonization surveillance program. It is not intended to diagnose MRSA infection nor to guide or monitor treatment for MRSA infections. Performed at [facility], [address] [place], [place] [address]     Coagulation Studies: No results for input(s): LABPROT, INR in the last 72 hours.  Urinalysis:  Recent Labs  Lab 09/30/17 1359  COLORURINE STRAW*  LABSPEC 1.005  PHURINE 7.0  GLUCOSEU NEGATIVE  HGBUR SMALL*  BILIRUBINUR NEGATIVE  KETONESUR NEGATIVE  PROTEINUR NEGATIVE  NITRITE NEGATIVE  LEUKOCYTESUR SMALL*    Lipid Panel:    Component Value Date/Time   CHOL 144 10/01/2017 0358   TRIG 82 10/01/2017 0358   HDL 58 10/01/2017 0358   CHOLHDL  2.5 10/01/2017 0358   VLDL 16 10/01/2017 0358   LDLCALC 70 10/01/2017 0358    HgbA1C:  Lab Results  Component Value Date   HGBA1C 6.2 (H) 10/01/2017  Urine Drug Screen:      Component Value Date/Time   LABOPIA NEGATIVE 08/14/2014 0933   COCAINSCRNUR NEGATIVE 08/14/2014 0933   LABBENZ NEGATIVE 08/14/2014 0933   AMPHETMU NEGATIVE 08/14/2014 0933   THCU NEGATIVE 08/14/2014 0933   LABBARB NEGATIVE 08/14/2014 0933    Alcohol Level: No results for input(s): ETH in the last 168 hours.  Other results: EKG: 65 bpm with 3rd degree AV block.  Imaging: Dg Chest 2 View  Result Date: 09/30/2017 CLINICAL DATA:  Chest pain and left shoulder pain, no known injury EXAM: CHEST  2 VIEW COMPARISON:  2/14/6 FINDINGS: Cardiac shadow is at the upper limits of normal in size. The lungs are well aerated bilaterally. Mild chronic changes are seen stable from the prior exam. No focal infiltrate or sizable effusion is noted. Old rib fractures are seen. Elevation the right hemidiaphragm is again noted. IMPRESSION: No acute abnormality noted. Electronically Signed   By: Inez Catalina M.D.   On: 09/30/2017 14:22   Dg Humerus Left  Result Date: 09/30/2017 CLINICAL DATA:  Left arm pain EXAM: LEFT HUMERUS - 2+ VIEW COMPARISON:  Shoulder x-ray 10/15/2014 FINDINGS: There is no evidence of fracture or other focal bone lesions. Soft tissues are unremarkable. IMPRESSION: Negative. Electronically Signed   By: Misty Stanley M.D.   On: 09/30/2017 15:42   Ct Angio Chest Aorta W And/or Wo Contrast  Result Date: 09/30/2017 CLINICAL DATA:  Patient with left arm and shoulder pain. EXAM: CT ANGIOGRAPHY CHEST, ABDOMEN AND PELVIS TECHNIQUE: Multidetector CT imaging through the chest, abdomen and pelvis was performed using the standard protocol during bolus administration of intravenous contrast. Multiplanar reconstructed images and MIPs were obtained and reviewed to evaluate the vascular anatomy. CONTRAST:  152mL ISOVUE-370  IOPAMIDOL (ISOVUE-370) INJECTION 76% COMPARISON:  CT chest 10/15/2014 FINDINGS: CTA CHEST FINDINGS Cardiovascular: Normal heart size. Trace pericardial fluid. Coronary arterial vascular calcifications. Thoracic aortic vascular calcifications. No focal area of peripheral high attenuation on noncontrast images within the thoracic aorta to suggest acute intramural hematoma. Post-contrast images demonstrate no acute thoracic aortic dissection. Ascending thoracic aorta measures 4 cm. Atherosclerotic narrowing at the origin of the left-greater-than-right vertebral arteries. Pulmonary artery is opacified. No evidence for central pulmonary embolus. Mediastinum/Nodes: No enlarged axillary, mediastinal or hilar lymphadenopathy. Normal esophagus. Lungs/Pleura: Central airways are patent. Dependent atelectasis within the bilateral lower lobes. Stable 2 mm nodule along the left fissure (image 90; series 7). Dependent atelectasis within the right lower lobe. There is a 6 mm right lower lobe nodule (image 66; series 7). This is slightly increased in size from more remote exams however may be similar to more recent exam, but difficult to compare given differences in slice thickness. No pleural effusion or pneumothorax. Musculoskeletal: Re-demonstrated old posterior left rib fractures. Read demonstrated old posterior right rib fractures. Unchanged T7 wedge compression deformity. Review of the MIP images confirms the above findings. CTA ABDOMEN AND PELVIS FINDINGS VASCULAR Aorta: Peripheral calcified atherosclerotic plaque involving the normal caliber abdominal aorta. Celiac: The left gastric and right hepatic artery originate from a common trunk off of the aorta. Inferior and lateral is the origin of the splenic and left hepatic arteries, arising from a common trunk. SMA: Patent Renals: Single bilateral patent renal arteries IMA: Occluded. Inflow: Patent without evidence of aneurysm, dissection, vasculitis or significant stenosis.  Mild atherosclerotic narrowing at the origin of the femoral arteries bilaterally. Atherosclerotic plaque involving the profunda femoral arteries bilaterally. Veins: No obvious venous abnormality within the limitations of this arterial phase  study. Review of the MIP images confirms the above findings. NON-VASCULAR Hepatobiliary: Liver is normal in size and contour. Small stones/sludge within the gallbladder lumen. No gallbladder wall thickening or pericholecystic fluid. Pancreas: Unremarkable Spleen: Unremarkable Adrenals/Urinary Tract: Unchanged 2.6 cm left adrenal nodule. Unchanged 1.8 cm right adrenal nodule. The kidneys are atrophic bilaterally and lobular in contour. No hydronephrosis. Stomach/Bowel: No abnormal bowel wall thickening or evidence for bowel obstruction. No free fluid or free intraperitoneal air. Normal morphology of the stomach. Normal appendix. Lymphatic: No retroperitoneal lymphadenopathy. Reproductive: Prostate is not well visualized due to streak artifact. Other: None. Musculoskeletal: Left hip arthroplasty. Lumbar spine degenerative changes. No aggressive or acute appearing osseous lesions. Review of the MIP images confirms the above findings. IMPRESSION: 1. No evidence for acute thoracic or abdominal aortic dissection. 2. No acute process within the chest, abdomen or pelvis. 3. Atrophic kidneys bilaterally. 4. There is a 6 mm right lower lobe pulmonary nodule which may be increased in size from more remote exams and is difficult to compare with recent exams due to differences in slice thickness however may be similar in size. Recommend follow-up chest CT in 6 months to ensure stability. 5. Atherosclerotic narrowing at the origin of the left greater than right vertebral arteries. Electronically Signed   By: Lovey Newcomer M.D.   On: 09/30/2017 16:55   Ct Renal Stone Study  Result Date: 09/30/2017 CLINICAL DATA:  Flank pain.  Suspected urolithiasis. EXAM: CT ABDOMEN AND PELVIS WITHOUT  CONTRAST TECHNIQUE: Multidetector CT imaging of the abdomen and pelvis was performed following the standard protocol without IV contrast. COMPARISON:  05/27/2011 FINDINGS: Lower chest: No acute findings. Hepatobiliary: No mass visualized on this unenhanced exam. Tiny calcified gallstones are noted, however there is no evidence of cholecystitis or biliary ductal dilatation. Pancreas: No mass or inflammatory process visualized on this unenhanced exam. Spleen:  Within normal limits in size. Adrenals/Urinary tract: Stable 2.6 cm left and 1.9 cm right adrenal adenomas are stable. No evidence of urolithiasis or hydronephrosis. Mild bilateral renal parenchymal scarring again seen. The unremarkable unopacified urinary bladder. Stomach/Bowel: No evidence of obstruction, inflammatory process, or abnormal fluid collections. Normal appendix visualized. Vascular/Lymphatic: No pathologically enlarged lymph nodes identified. No evidence of abdominal aortic aneurysm. Aortic atherosclerosis. Reproductive:  No mass or other significant abnormality. Other:  None. Musculoskeletal: No suspicious bone lesions identified. Left hip prosthesis noted. IMPRESSION: No evidence of urolithiasis, hydronephrosis, or other acute findings. Stable mild bilateral renal parenchymal scarring. Gallstones are seen, however there is no evidence of cholecystitis or biliary dilatation. Stable small benign bilateral adrenal adenomas. Electronically Signed   By: Earle Gell M.D.   On: 09/30/2017 16:10   Ct Head Code Stroke Wo Contrast  Result Date: 09/30/2017 CLINICAL DATA:  Code stroke.  Right-sided weakness EXAM: CT HEAD WITHOUT CONTRAST TECHNIQUE: Contiguous axial images were obtained from the base of the skull through the vertex without intravenous contrast. COMPARISON:  Brain MRI 10/17/2014 FINDINGS: Brain: No evidence of acute infarction, hemorrhage, hydrocephalus, extra-axial collection or mass lesion/mass effect. Moderate remote lateral right  frontal infarct. Dense encephalomalacia of the bilateral inferior frontal lobes, posttraumatic pattern. Mild encephalomalacia in the right temporal pole. Advanced cortical atrophy, worse on the right where it is severe and progressed since 2016. Left cerebral atrophy is asymmetrically advanced at the vertex. 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 A proximal chronic high-grade stenosis is also considered. Small remote lateral left cerebellar infarct. Vascular: Atherosclerotic  calcification.  No hyperdense vessel. Skull: Negative Sinuses/Orbits: Negative Other: These results were called by telephone at the time of interpretation on 09/30/2017 at 4:24 pm to Dr. Joni Fears , who verbally acknowledged these results. ASPECTS [facility] Stroke Program Early CT Score) -left hemisphere - Ganglionic level infarction (caudate, lentiform nuclei, internal capsule, insula, M1-M3 cortex): 7 - Supraganglionic infarction (M4-M6 cortex): 3 Total score (0-10 with 10 being normal): 10 IMPRESSION: 1. No hemorrhage or visible acute infarct. 2. Advanced cortical atrophy, particularly severe on the right, progressed from 2016-as above. 3. Bilateral inferior frontal and right temporal encephalomalacia, a posttraumatic pattern. 4. 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 Electronically Signed   By: Monte Fantasia M.D.   On: 09/30/2017 16:31   Ct Angio Abd/pel W And/or Wo Contrast  Result Date: 09/30/2017 CLINICAL DATA:  Patient with left arm and shoulder pain. EXAM: CT ANGIOGRAPHY CHEST, ABDOMEN AND PELVIS TECHNIQUE: Multidetector CT imaging through the chest, abdomen and pelvis was performed using the standard protocol during bolus administration of intravenous contrast. Multiplanar reconstructed images and MIPs were obtained and reviewed to evaluate the vascular anatomy. CONTRAST:  13mL ISOVUE-370 IOPAMIDOL (ISOVUE-370)  INJECTION 76% COMPARISON:  CT chest 10/15/2014 FINDINGS: CTA CHEST FINDINGS Cardiovascular: Normal heart size. Trace pericardial fluid. Coronary arterial vascular calcifications. Thoracic aortic vascular calcifications. No focal area of peripheral high attenuation on noncontrast images within the thoracic aorta to suggest acute intramural hematoma. Post-contrast images demonstrate no acute thoracic aortic dissection. Ascending thoracic aorta measures 4 cm. Atherosclerotic narrowing at the origin of the left-greater-than-right vertebral arteries. Pulmonary artery is opacified. No evidence for central pulmonary embolus. Mediastinum/Nodes: No enlarged axillary, mediastinal or hilar lymphadenopathy. Normal esophagus. Lungs/Pleura: Central airways are patent. Dependent atelectasis within the bilateral lower lobes. Stable 2 mm nodule along the left fissure (image 90; series 7). Dependent atelectasis within the right lower lobe. There is a 6 mm right lower lobe nodule (image 66; series 7). This is slightly increased in size from more remote exams however may be similar to more recent exam, but difficult to compare given differences in slice thickness. No pleural effusion or pneumothorax. Musculoskeletal: Re-demonstrated old posterior left rib fractures. Read demonstrated old posterior right rib fractures. Unchanged T7 wedge compression deformity. Review of the MIP images confirms the above findings. CTA ABDOMEN AND PELVIS FINDINGS VASCULAR Aorta: Peripheral calcified atherosclerotic plaque involving the normal caliber abdominal aorta. Celiac: The left gastric and right hepatic artery originate from a common trunk off of the aorta. Inferior and lateral is the origin of the splenic and left hepatic arteries, arising from a common trunk. SMA: Patent Renals: Single bilateral patent renal arteries IMA: Occluded. Inflow: Patent without evidence of aneurysm, dissection, vasculitis or significant stenosis. Mild atherosclerotic  narrowing at the origin of the femoral arteries bilaterally. Atherosclerotic plaque involving the profunda femoral arteries bilaterally. Veins: No obvious venous abnormality within the limitations of this arterial phase study. Review of the MIP images confirms the above findings. NON-VASCULAR Hepatobiliary: Liver is normal in size and contour. Small stones/sludge within the gallbladder lumen. No gallbladder wall thickening or pericholecystic fluid. Pancreas: Unremarkable Spleen: Unremarkable Adrenals/Urinary Tract: Unchanged 2.6 cm left adrenal nodule. Unchanged 1.8 cm right adrenal nodule. The kidneys are atrophic bilaterally and lobular in contour. No hydronephrosis. Stomach/Bowel: No abnormal bowel wall thickening or evidence for bowel obstruction. No free fluid or free intraperitoneal air. Normal morphology of the stomach. Normal appendix. Lymphatic: No retroperitoneal lymphadenopathy. Reproductive: Prostate is not well visualized due  to streak artifact. Other: None. Musculoskeletal: Left hip arthroplasty. Lumbar spine degenerative changes. No aggressive or acute appearing osseous lesions. Review of the MIP images confirms the above findings. IMPRESSION: 1. No evidence for acute thoracic or abdominal aortic dissection. 2. No acute process within the chest, abdomen or pelvis. 3. Atrophic kidneys bilaterally. 4. There is a 6 mm right lower lobe pulmonary nodule which may be increased in size from more remote exams and is difficult to compare with recent exams due to differences in slice thickness however may be similar in size. Recommend follow-up chest CT in 6 months to ensure stability. 5. Atherosclerotic narrowing at the origin of the left greater than right vertebral arteries. Electronically Signed   By: Lovey Newcomer M.D.   On: 09/30/2017 16:55    Assessment: 67 y.o. male with a history of stroke presenting with left-sided symptoms including pain numbness and weakness.  Patient reports symptoms have been  present for the past 2 weeks weeks with some fluctuating nature to them.  Head CT reviewed and shows no acute changes.  Atrophy is noted more severe on the right as compared to the left.  Cannot rule out the possibility of an acute infarct.  Further imaging indicated.  Patient on aspirin prior to admission.  Patient also on Neurontin prior to admission at 1800 mg a day. LDL 70.  A1c 6.2.  Stroke Risk Factors - hypertension and smoking  Plan: 1. MRI, MRA  of the brain without contrast.  Stroke work up indicated if indicative of an acute infarct.   2. PT consult, OT consult, Speech consult 3. Prophylactic therapy-Continue ASA 4. NPO until RN stroke swallow screen 5. Telemetry monitoring 6. Frequent neuro checks 7. Increase Neurontin to 800mg  TID 8. Smoking cessation counseling   Alexis Goodell, MD Neurology [phone] 10/01/2017, 10:16 AM

## 2017-10-01 NOTE — Progress Notes (Signed)
[facility] [place] NAME: Calvin [name]    MR#:  [id number]  DATE OF BIRTH:  Jan 12, 1951  SUBJECTIVE:  CHIEF COMPLAINT:   Chief Complaint  Patient presents with  . Chest Pain  feels weak, in pain and requests stronger pain meds REVIEW OF SYSTEMS:  Review of Systems  Constitutional: Negative for chills, fever and weight loss.  HENT: Negative for nosebleeds and sore throat.   Eyes: Negative for blurred vision.  Respiratory: Negative for cough, shortness of breath and wheezing.   Cardiovascular: Negative for chest pain, orthopnea, leg swelling and PND.  Gastrointestinal: Negative for abdominal pain, constipation, diarrhea, heartburn, nausea and vomiting.  Genitourinary: Negative for dysuria and urgency.  Musculoskeletal: Negative for back pain.  Skin: Negative for rash.  Neurological: Positive for sensory change and focal weakness. Negative for dizziness, speech change and headaches.  Endo/Heme/Allergies: Does not bruise/bleed easily.  Psychiatric/Behavioral: Negative for depression.    DRUG ALLERGIES:  No Known Allergies VITALS:  Blood pressure 95/61, pulse (!) 101, temperature (!) 97.5 F (36.4 C), resp. rate 18, height 5\' 11"  (1.803 m), weight 96.1 kg (211 lb 13.8 oz), SpO2 99 %. PHYSICAL EXAMINATION:  Physical Exam  Constitutional: He is oriented to person, place, and time and well-developed, well-nourished, and in no distress.  HENT:  Head: Normocephalic and atraumatic.  Eyes: Conjunctivae and EOM are normal. Pupils are equal, round, and reactive to light.  Neck: Normal range of motion. Neck supple. No tracheal deviation present. No thyromegaly present.  Cardiovascular: Normal rate, regular rhythm and normal heart sounds.  Pulmonary/Chest: Effort normal and breath sounds normal. No respiratory distress. He has no wheezes. He exhibits no tenderness.  Abdominal: Soft. Bowel sounds are normal. He exhibits no distension. There is no  tenderness.  Musculoskeletal: Normal range of motion.  Neurological: He is alert and oriented to person, place, and time. He displays weakness ( Left > Right). No cranial nerve deficit.  Skin: Skin is warm and dry. No rash noted.  Psychiatric: Mood and affect normal.   LABORATORY PANEL:  Male CBC Recent Labs  Lab 09/30/17 1435  WBC 5.2  HGB 14.2  HCT 43.7  PLT 293   ------------------------------------------------------------------------------------------------------------------ Chemistries  Recent Labs  Lab 09/30/17 1435  NA 136  K 5.1  CL 100*  CO2 27  GLUCOSE 120*  BUN 19  CREATININE 1.36*  CALCIUM 9.3   RADIOLOGY:  Mr Brain Wo Contrast  Result Date: 10/01/2017 CLINICAL DATA:  Left arm pain, subsequent left arm and leg weakness, and left facial numbness. EXAM: MRI HEAD WITHOUT CONTRAST MRA HEAD WITHOUT CONTRAST TECHNIQUE: Multiplanar, multiecho pulse sequences of the brain and surrounding structures were obtained without intravenous contrast. Angiographic images of the head were obtained using MRA technique without contrast. COMPARISON:  Head CT 09/30/2017 and MRI 10/17/2014 FINDINGS: MRI HEAD FINDINGS Brain: There is no evidence of acute infarct, intracranial hemorrhage, mass, midline shift, or extra-axial fluid collection. Age advanced cerebral atrophy is more severe on the right than the left and has progressed from 2016. Chronic encephalomalacia is again noted in the right greater than left inferior frontal lobes and anterior right temporal lobe most consistent with remote trauma. A moderate-sized region of encephalomalacia in the right occipital lobe is new from 2016, as is a small area of focal encephalomalacia in the right frontal operculum. 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 Patchy periventricular white matter T2 hyperintensities may have mildly progressed  from 2016 and are nonspecific but compatible with moderate chronic  small vessel ischemic disease. Vascular: Abnormal distal left vertebral artery, evaluated below. Skull and upper cervical spine: No suspicious marrow lesion. Cervical spondylosis resulting in spinal stenosis at C2-3 and C3-4, value aided in detail on a 05/31/2017 cervical spine MRI. Sinuses/Orbits: Unremarkable orbits. Paranasal sinuses and mastoid air cells are clear. Other: None. MRA HEAD FINDINGS The visualized distal right vertebral artery is widely patent and supplies the basilar. There is loss of flow related enhancement in the distal left V3 segment consistent with occlusion. Distal V4 segment flow from the PICA origin to the vertebrobasilar junction is likely retrograde. The right AICA appears dominant. Patent SCA origins are identified bilaterally. The basilar artery is widely patent. There are left larger than right posterior communicating arteries. The PCAs are patent without evidence of significant proximal stenosis. The internal carotid arteries are widely patent from skull base to carotid termini. The right A1 segment is absent. The left A1 segment and both A2 segments are widely patent. The MCAs are patent without evidence of proximal branch occlusion or significant stenosis. No aneurysm is identified. IMPRESSION: 1. No acute intracranial abnormality. 2. Progressive chronic ischemic changes from 2016 including interval left cerebellar, right occipital, and right frontal infarcts. 3. Advanced cerebral atrophy, severe on the right hemisphere and progressed from 2016. 4. 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 5. Occluded distal left vertebral artery. 6. Widely patent distal right vertebral artery and anterior circulation. Electronically Signed   By: Logan Bores M.D.   On: 10/01/2017 14:29   US Carotid Bilateral (at [facility] Only)  Result Date: 10/01/2017 CLINICAL DATA:  Left-sided weakness EXAM: BILATERAL CAROTID DUPLEX ULTRASOUND TECHNIQUE:  Pearline Cables scale imaging, color Doppler and duplex ultrasound were performed of bilateral carotid and vertebral arteries in the neck. COMPARISON:  None. FINDINGS: Criteria: Quantification of carotid stenosis is based on velocity parameters that correlate the residual internal carotid diameter with NASCET-based stenosis levels, using the diameter of the distal internal carotid lumen as the denominator for stenosis measurement. The following velocity measurements were obtained: RIGHT ICA:  111 cm/sec CCA:  71 cm/sec SYSTOLIC ICA/CCA RATIO:  1.6 DIASTOLIC ICA/CCA RATIO:  2.4 ECA:  64 cm/sec LEFT ICA:  136 cm/sec CCA:  51 cm/sec SYSTOLIC ICA/CCA RATIO:  2.7 DIASTOLIC ICA/CCA RATIO:  3.7 ECA:  106 cm/sec RIGHT CAROTID ARTERY: Moderate smooth soft plaque in the bulb. Low resistance internal carotid Doppler pattern is preserved. RIGHT VERTEBRAL ARTERY:  Antegrade. LEFT CAROTID ARTERY: There is prominent mixed plaque in the bulb. Low resistance internal carotid Doppler pattern is preserved the internal carotid is tortuous. LEFT VERTEBRAL ARTERY:  Antegrade. IMPRESSION: Less than 50% stenosis in the right internal carotid artery. 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 Electronically Signed   By: Marybelle Killings M.D.   On: 10/01/2017 15:23   Mr Jodene Nam Head/brain UM Cm  Result Date: 10/01/2017 CLINICAL DATA:  Left arm pain, subsequent left arm and leg weakness, and left facial numbness. EXAM: MRI HEAD WITHOUT CONTRAST MRA HEAD WITHOUT CONTRAST TECHNIQUE: Multiplanar, multiecho pulse sequences of the brain and surrounding structures were obtained without intravenous contrast. Angiographic images of the head were obtained using MRA technique without contrast. COMPARISON:  Head CT 09/30/2017 and MRI 10/17/2014 FINDINGS: MRI HEAD FINDINGS Brain: There is no evidence of acute infarct, intracranial hemorrhage, mass, midline shift, or extra-axial fluid collection. Age advanced cerebral atrophy is more severe on the right than  the left and has progressed from 2016. Chronic encephalomalacia is again noted in the right greater than left inferior frontal lobes and anterior right temporal lobe most consistent with remote trauma. A moderate-sized region of encephalomalacia in the right occipital lobe is new from 2016, as is a small area of focal encephalomalacia in the right frontal operculum. 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 Patchy periventricular white matter T2 hyperintensities may have mildly progressed from 2016 and are nonspecific but compatible with moderate chronic small vessel ischemic disease. Vascular: Abnormal distal left vertebral artery, evaluated below. Skull and upper cervical spine: No suspicious marrow lesion. Cervical spondylosis resulting in spinal stenosis at C2-3 and C3-4, value aided in detail on a 05/31/2017 cervical spine MRI. Sinuses/Orbits: Unremarkable orbits. Paranasal sinuses and mastoid air cells are clear. Other: None. MRA HEAD FINDINGS The visualized distal right vertebral artery is widely patent and supplies the basilar. There is loss of flow related enhancement in the distal left V3 segment consistent with occlusion. Distal V4 segment flow from the PICA origin to the vertebrobasilar junction is likely retrograde. The right AICA appears dominant. Patent SCA origins are identified bilaterally. The basilar artery is widely patent. There are left larger than right posterior communicating arteries. The PCAs are patent without evidence of significant proximal stenosis. The internal carotid arteries are widely patent from skull base to carotid termini. The right A1 segment is absent. The left A1 segment and both A2 segments are widely patent. The MCAs are patent without evidence of proximal branch occlusion or significant stenosis. No aneurysm is identified. IMPRESSION: 1. No acute intracranial abnormality. 2. Progressive chronic ischemic changes from 2016 including  interval left cerebellar, right occipital, and right frontal infarcts. 3. Advanced cerebral atrophy, severe on the right hemisphere and progressed from 2016. 4. 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 5. Occluded distal left vertebral artery. 6. Widely patent distal right vertebral artery and anterior circulation. Electronically Signed   By: Logan Bores M.D.   On: 10/01/2017 14:29   ASSESSMENT AND PLAN:  67 y.o. male with a history of stroke presenting with left-sided symptoms including numbness and weakness.    * Left side weakness and numbness: symptoms off & on for last 2 weeks - CT head neg - MRI, MRA  of the brain neg for acute patho - PT recommends STR/SNF - Increase Neurontin to 800mg  TID  * Seizures ([place]) -continue home dose Keppra, seizure precautions   * Hypotension with h/o HTN (hypertension) -monitor, may be gentle hydration to help with BP  * CAD (coronary artery disease) -continue home medications  * COPD (chronic obstructive pulmonary disease) ([place]) -continue home dose inhalers  * Depression with anxiety -continue zoloft and xanax    C/s CSW for STR/SNF. He is resident at [facility] (long term)   All the records are reviewed and case discussed with Care Management/Social Worker. Management plans discussed with the patient, nursing and they are in agreement.  CODE STATUS: Full Code  TOTAL TIME TAKING CARE OF THIS PATIENT: 35 minutes.   More than 50% of the time was spent in counseling/coordination of care: YES  POSSIBLE D/C IN 1 DAYS, DEPENDING ON CLINICAL CONDITION.   Max Sane M.D on 10/01/2017 at 6:36 PM  Between 7am to 6pm - Pager - [phone]  After 6pm go to [email or web address] - [employer] [facility]  Office  [phone]  CC: Primary care physician; Lavera Guise  K, MD  Note: This dictation was prepared with Dragon dictation along with smaller phrase technology.  Any transcriptional errors that result from this process are unintentional.

## 2017-10-01 NOTE — Progress Notes (Signed)
*  PRELIMINARY RESULTS* Echocardiogram 2D Echocardiogram has been performed.  Wallie Char Gianpaolo Mindel 10/01/2017, 9:51 AM

## 2017-10-01 NOTE — Progress Notes (Signed)
OT Cancellation Note  Patient Details Name: BANKS CHAIKIN MRN: [id number] DOB: [birth year]   Cancelled Treatment:    Reason Eval/Treat Not Completed: Patient at procedure or test/ unavailable. Order received, chart reviewed. Pt out of room for testing. Will re-attempt OT evaluation at later date/time as pt is available and medically appropriate.  Jeni Salles, MPH, MS, OTR/L ascom [phone] 10/01/17, 2:03 PM

## 2017-10-02 ENCOUNTER — Observation Stay: Payer: Medicare Other

## 2017-10-02 DIAGNOSIS — R531 Weakness: Secondary | ICD-10-CM | POA: Diagnosis not present

## 2017-10-02 DIAGNOSIS — I6523 Occlusion and stenosis of bilateral carotid arteries: Secondary | ICD-10-CM | POA: Diagnosis not present

## 2017-10-02 LAB — BASIC METABOLIC PANEL
ANION GAP: 8 (ref 5–15)
BUN: 19 mg/dL (ref 6–20)
CALCIUM: 9 mg/dL (ref 8.9–10.3)
CO2: 25 mmol/L (ref 22–32)
CREATININE: 1.38 mg/dL — AB (ref 0.61–1.24)
Chloride: 103 mmol/L (ref 101–111)
GFR calc non Af Amer: 51 mL/min — ABNORMAL LOW (ref >60)
GFR, EST AFRICAN AMERICAN: 59 mL/min — AB (ref >60)
Glucose, Bld: 93 mg/dL (ref 65–99)
Potassium: 4.1 mmol/L (ref 3.5–5.1)
SODIUM: 136 mmol/L (ref 135–145)

## 2017-10-02 LAB — CBC
HEMATOCRIT: 44.5 % (ref 40.0–52.0)
Hemoglobin: 14.5 g/dL (ref 13.0–18.0)
MCH: 30.1 pg (ref 26.0–34.0)
MCHC: 32.5 g/dL (ref 32.0–36.0)
MCV: 92.5 fL (ref 80.0–100.0)
Platelets: 276 10*3/uL (ref 150–440)
RBC: 4.81 MIL/uL (ref 4.40–5.90)
RDW: 14.2 % (ref 11.5–14.5)
WBC: 4.6 10*3/uL (ref 3.8–10.6)

## 2017-10-02 MED ORDER — HYDROCODONE-ACETAMINOPHEN 5-325 MG PO TABS: 1.0000 | ORAL_TABLET | Freq: Four times a day (QID) | ORAL | 0 refills | Status: DC | PRN

## 2017-10-02 MED ORDER — IOPAMIDOL (ISOVUE-370) INJECTION 76%
75.0000 mL | Freq: Once | INTRAVENOUS | Status: AC | PRN
  Administered 2017-10-02: 75 mL via INTRAVENOUS

## 2017-10-02 MED ORDER — ALPRAZOLAM 0.5 MG PO TABS: 0.5000 mg | ORAL_TABLET | Freq: Three times a day (TID) | ORAL | 0 refills | Status: DC

## 2017-10-02 MED ORDER — GABAPENTIN 400 MG PO CAPS: 800.0000 mg | ORAL_CAPSULE | Freq: Three times a day (TID) | ORAL | 0 refills | Status: DC

## 2017-10-02 NOTE — Progress Notes (Signed)
Report called to Rush Landmark , RN at [facility] and EMS contacted to transport.  Clarise Cruz, RN

## 2017-10-02 NOTE — Progress Notes (Signed)
Clinical Education officer, museum (CSW) sent D/C summary to [facility] via [place]. [place] admissions coordinator at [facility] is aware of above.   [employer], LCSW [phone]

## 2017-10-02 NOTE — Progress Notes (Signed)
Patient transported to [facility] via EMS.  Clarise Cruz, RN

## 2017-10-02 NOTE — Care Management Obs Status (Signed)
[place] NOTIFICATION   Patient Details  Name: Calvin [name] MRN: [id number] Date of Birth: 01-May-1951   Medicare Observation Status Notification Given:  Yes    Marshell Garfinkel, RN 10/02/2017, 9:37 AM

## 2017-10-02 NOTE — Progress Notes (Addendum)
LCSW following for disposition of needs  Patient is a LTC patient at [facility].  PT/OT have evaluated patient and recommended ST rehab. Call placed to [facility] to see if patient can be authorized due to hospitalization and new recommendations.    Liliane Channel will work on authorization for patient and therapy notes sent through [employer].  Currently patient remains in hospital for medical work up. Daughter was contacted on 1/28 and updated by this Probation officer.  Will continue to follow and assist with dc plans.  Anticipate patient returning to [facility] with SNF LOC.  2:03 PM Per MD patient medically stable to return to [facility] today. Facility aware and called and agreeable to plan. Daughter: [name] has been notified  [phone] and she will meet with patient this evening at facility. Patient will transport by EMS. No other needs. DC back to SNF today.  Lane Hacker, MSW Clinical Social Work: [employer] Coverage for :  [phone]

## 2017-10-02 NOTE — Progress Notes (Signed)
Subjective: Patient reports that he continues to have left sided pain but reports that it is intermittent and mostly in the fingertips on the left hand.  Objective: Current vital signs: BP 120/74 (BP Location: Left Arm)   Pulse 88   Temp 98.8 F (37.1 C) (Oral)   Resp 18   Ht 5\' 11"  (1.803 m)   Wt 96.1 kg (211 lb 13.8 oz)   SpO2 96%   BMI 29.55 kg/m  Vital signs in last 24 hours: Temp:  [98 F (36.7 C)-99.1 F (37.3 C)] 98.8 F (37.1 C) (01/29 0815) Pulse Rate:  [86-101] 88 (01/29 0815) Resp:  [18] 18 (01/29 0815) BP: (95-123)/(61-83) 120/74 (01/29 0815) SpO2:  [94 %-99 %] 96 % (01/29 0815)  Intake/Output from previous day: 01/28 0701 - 01/29 0700 In: 837.5 [P.O.:240; I.V.:597.5] Out: 500 [Urine:500] Intake/Output this shift: No intake/output data recorded. Nutritional status: Fall precautions Diet Heart Room service appropriate? Yes; Fluid consistency: Thin Seizure precautions  Neurologic Exam: Mental Status: Alert, oriented, thought content appropriate.  Speech fluent without evidence of aphasia.  Able to follow 3 step commands without difficulty. Cranial Nerves: II: Discs flat bilaterally; LHH, pupils equal, round, reactive to light and accommodation III,IV, VI: ptosis not present, extra-ocular motions intact bilaterally V,VII: left facial droop, facial light touch sensation impaired on the left VIII: hearing normal bilaterally IX,X: gag reflex present XI: bilateral shoulder shrug XII: midline tongue extension Motor: Moves right greater than left with less posturing on the left    Lab Results: Basic Metabolic Panel: Recent Labs  Lab 09/30/17 1435 10/02/17 0656  NA 136 136  K 5.1 4.1  CL 100* 103  CO2 27 25  GLUCOSE 120* 93  BUN 19 19  CREATININE 1.36* 1.38*  CALCIUM 9.3 9.0    Liver Function Tests: No results for input(s): AST, ALT, ALKPHOS, BILITOT, PROT, ALBUMIN in the last 168 hours. No results for input(s): LIPASE, AMYLASE in the last 168  hours. No results for input(s): AMMONIA in the last 168 hours.  CBC: Recent Labs  Lab 09/30/17 1435 10/02/17 0656  WBC 5.2 4.6  HGB 14.2 14.5  HCT 43.7 44.5  MCV 91.7 92.5  PLT 293 276    Cardiac Enzymes: Recent Labs  Lab 09/30/17 1435  TROPONINI <0.03    Lipid Panel: Recent Labs  Lab 10/01/17 0358  CHOL 144  TRIG 82  HDL 58  CHOLHDL 2.5  VLDL 16  LDLCALC 70    CBG: No results for input(s): GLUCAP in the last 168 hours.  Microbiology: Results for orders placed or performed during the hospital encounter of 09/30/17  MRSA PCR Screening     Status: None   Collection Time: 09/30/17 10:58 PM  Result Value Ref Range Status   MRSA by PCR NEGATIVE NEGATIVE Final    Comment:        The GeneXpert MRSA Assay (FDA approved for NASAL specimens only), is one component of a comprehensive MRSA colonization surveillance program. It is not intended to diagnose MRSA infection nor to guide or monitor treatment for MRSA infections. Performed at [facility], [address] [place], [place] [address]     Coagulation Studies: No results for input(s): LABPROT, INR in the last 72 hours.  Imaging: Dg Chest 2 View  Result Date: 09/30/2017 CLINICAL DATA:  Chest pain and left shoulder pain, no known injury EXAM: CHEST  2 VIEW COMPARISON:  2/14/6 FINDINGS: Cardiac shadow is at the upper limits of normal in size. The lungs  are well aerated bilaterally. Mild chronic changes are seen stable from the prior exam. No focal infiltrate or sizable effusion is noted. Old rib fractures are seen. Elevation the right hemidiaphragm is again noted. IMPRESSION: No acute abnormality noted. Electronically Signed   By: Inez Catalina M.D.   On: 09/30/2017 14:22   Mr Brain Wo Contrast  Result Date: 10/01/2017 CLINICAL DATA:  Left arm pain, subsequent left arm and leg weakness, and left facial numbness. EXAM: MRI HEAD WITHOUT CONTRAST MRA HEAD WITHOUT CONTRAST TECHNIQUE: Multiplanar,  multiecho pulse sequences of the brain and surrounding structures were obtained without intravenous contrast. Angiographic images of the head were obtained using MRA technique without contrast. COMPARISON:  Head CT 09/30/2017 and MRI 10/17/2014 FINDINGS: MRI HEAD FINDINGS Brain: There is no evidence of acute infarct, intracranial hemorrhage, mass, midline shift, or extra-axial fluid collection. Age advanced cerebral atrophy is more severe on the right than the left and has progressed from 2016. Chronic encephalomalacia is again noted in the right greater than left inferior frontal lobes and anterior right temporal lobe most consistent with remote trauma. A moderate-sized region of encephalomalacia in the right occipital lobe is new from 2016, as is a small area of focal encephalomalacia in the right frontal operculum. 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 Patchy periventricular white matter T2 hyperintensities may have mildly progressed from 2016 and are nonspecific but compatible with moderate chronic small vessel ischemic disease. Vascular: Abnormal distal left vertebral artery, evaluated below. Skull and upper cervical spine: No suspicious marrow lesion. Cervical spondylosis resulting in spinal stenosis at C2-3 and C3-4, value aided in detail on a 05/31/2017 cervical spine MRI. Sinuses/Orbits: Unremarkable orbits. Paranasal sinuses and mastoid air cells are clear. Other: None. MRA HEAD FINDINGS The visualized distal right vertebral artery is widely patent and supplies the basilar. There is loss of flow related enhancement in the distal left V3 segment consistent with occlusion. Distal V4 segment flow from the PICA origin to the vertebrobasilar junction is likely retrograde. The right AICA appears dominant. Patent SCA origins are identified bilaterally. The basilar artery is widely patent. There are left larger than right posterior communicating arteries. The PCAs are  patent without evidence of significant proximal stenosis. The internal carotid arteries are widely patent from skull base to carotid termini. The right A1 segment is absent. The left A1 segment and both A2 segments are widely patent. The MCAs are patent without evidence of proximal branch occlusion or significant stenosis. No aneurysm is identified. IMPRESSION: 1. No acute intracranial abnormality. 2. Progressive chronic ischemic changes from 2016 including interval left cerebellar, right occipital, and right frontal infarcts. 3. Advanced cerebral atrophy, severe on the right hemisphere and progressed from 2016. 4. 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 5. Occluded distal left vertebral artery. 6. Widely patent distal right vertebral artery and anterior circulation. Electronically Signed   By: Logan Bores M.D.   On: 10/01/2017 14:29   US Carotid Bilateral (at [facility] Only)  Result Date: 10/01/2017 CLINICAL DATA:  Left-sided weakness EXAM: BILATERAL CAROTID DUPLEX ULTRASOUND TECHNIQUE: Pearline Cables scale imaging, color Doppler and duplex ultrasound were performed of bilateral carotid and vertebral arteries in the neck. COMPARISON:  None. FINDINGS: Criteria: Quantification of carotid stenosis is based on velocity parameters that correlate the residual internal carotid diameter with NASCET-based stenosis levels, using the diameter of the distal internal carotid lumen as the denominator for stenosis measurement. The following velocity measurements were  obtained: RIGHT ICA:  111 cm/sec CCA:  71 cm/sec SYSTOLIC ICA/CCA RATIO:  1.6 DIASTOLIC ICA/CCA RATIO:  2.4 ECA:  64 cm/sec LEFT ICA:  136 cm/sec CCA:  51 cm/sec SYSTOLIC ICA/CCA RATIO:  2.7 DIASTOLIC ICA/CCA RATIO:  3.7 ECA:  106 cm/sec RIGHT CAROTID ARTERY: Moderate smooth soft plaque in the bulb. Low resistance internal carotid Doppler pattern is preserved. RIGHT VERTEBRAL ARTERY:  Antegrade. LEFT CAROTID ARTERY:  There is prominent mixed plaque in the bulb. Low resistance internal carotid Doppler pattern is preserved the internal carotid is tortuous. LEFT VERTEBRAL ARTERY:  Antegrade. IMPRESSION: Less than 50% stenosis in the right internal carotid artery. 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 Electronically Signed   By: Marybelle Killings M.D.   On: 10/01/2017 15:23   Dg Humerus Left  Result Date: 09/30/2017 CLINICAL DATA:  Left arm pain EXAM: LEFT HUMERUS - 2+ VIEW COMPARISON:  Shoulder x-ray 10/15/2014 FINDINGS: There is no evidence of fracture or other focal bone lesions. Soft tissues are unremarkable. IMPRESSION: Negative. Electronically Signed   By: Misty Stanley M.D.   On: 09/30/2017 15:42   Mr Jodene Nam Head/brain RW Cm  Result Date: 10/01/2017 CLINICAL DATA:  Left arm pain, subsequent left arm and leg weakness, and left facial numbness. EXAM: MRI HEAD WITHOUT CONTRAST MRA HEAD WITHOUT CONTRAST TECHNIQUE: Multiplanar, multiecho pulse sequences of the brain and surrounding structures were obtained without intravenous contrast. Angiographic images of the head were obtained using MRA technique without contrast. COMPARISON:  Head CT 09/30/2017 and MRI 10/17/2014 FINDINGS: MRI HEAD FINDINGS Brain: There is no evidence of acute infarct, intracranial hemorrhage, mass, midline shift, or extra-axial fluid collection. Age advanced cerebral atrophy is more severe on the right than the left and has progressed from 2016. Chronic encephalomalacia is again noted in the right greater than left inferior frontal lobes and anterior right temporal lobe most consistent with remote trauma. A moderate-sized region of encephalomalacia in the right occipital lobe is new from 2016, as is a small area of focal encephalomalacia in the right frontal operculum. 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 Patchy periventricular white matter T2 hyperintensities may have mildly progressed from 2016  and are nonspecific but compatible with moderate chronic small vessel ischemic disease. Vascular: Abnormal distal left vertebral artery, evaluated below. Skull and upper cervical spine: No suspicious marrow lesion. Cervical spondylosis resulting in spinal stenosis at C2-3 and C3-4, value aided in detail on a 05/31/2017 cervical spine MRI. Sinuses/Orbits: Unremarkable orbits. Paranasal sinuses and mastoid air cells are clear. Other: None. MRA HEAD FINDINGS The visualized distal right vertebral artery is widely patent and supplies the basilar. There is loss of flow related enhancement in the distal left V3 segment consistent with occlusion. Distal V4 segment flow from the PICA origin to the vertebrobasilar junction is likely retrograde. The right AICA appears dominant. Patent SCA origins are identified bilaterally. The basilar artery is widely patent. There are left larger than right posterior communicating arteries. The PCAs are patent without evidence of significant proximal stenosis. The internal carotid arteries are widely patent from skull base to carotid termini. The right A1 segment is absent. The left A1 segment and both A2 segments are widely patent. The MCAs are patent without evidence of proximal branch occlusion or significant stenosis. No aneurysm is identified. IMPRESSION: 1. No acute intracranial abnormality. 2. Progressive chronic ischemic changes from 2016 including interval left cerebellar, right occipital, and right frontal infarcts. 3. Advanced cerebral atrophy, severe on  the right hemisphere and progressed from 2016. 4. 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 5. Occluded distal left vertebral artery. 6. Widely patent distal right vertebral artery and anterior circulation. Electronically Signed   By: Logan Bores M.D.   On: 10/01/2017 14:29   Ct Angio Chest Aorta W And/or Wo Contrast  Result Date: 09/30/2017 CLINICAL DATA:  Patient with left  arm and shoulder pain. EXAM: CT ANGIOGRAPHY CHEST, ABDOMEN AND PELVIS TECHNIQUE: Multidetector CT imaging through the chest, abdomen and pelvis was performed using the standard protocol during bolus administration of intravenous contrast. Multiplanar reconstructed images and MIPs were obtained and reviewed to evaluate the vascular anatomy. CONTRAST:  197mL ISOVUE-370 IOPAMIDOL (ISOVUE-370) INJECTION 76% COMPARISON:  CT chest 10/15/2014 FINDINGS: CTA CHEST FINDINGS Cardiovascular: Normal heart size. Trace pericardial fluid. Coronary arterial vascular calcifications. Thoracic aortic vascular calcifications. No focal area of peripheral high attenuation on noncontrast images within the thoracic aorta to suggest acute intramural hematoma. Post-contrast images demonstrate no acute thoracic aortic dissection. Ascending thoracic aorta measures 4 cm. Atherosclerotic narrowing at the origin of the left-greater-than-right vertebral arteries. Pulmonary artery is opacified. No evidence for central pulmonary embolus. Mediastinum/Nodes: No enlarged axillary, mediastinal or hilar lymphadenopathy. Normal esophagus. Lungs/Pleura: Central airways are patent. Dependent atelectasis within the bilateral lower lobes. Stable 2 mm nodule along the left fissure (image 90; series 7). Dependent atelectasis within the right lower lobe. There is a 6 mm right lower lobe nodule (image 66; series 7). This is slightly increased in size from more remote exams however may be similar to more recent exam, but difficult to compare given differences in slice thickness. No pleural effusion or pneumothorax. Musculoskeletal: Re-demonstrated old posterior left rib fractures. Read demonstrated old posterior right rib fractures. Unchanged T7 wedge compression deformity. Review of the MIP images confirms the above findings. CTA ABDOMEN AND PELVIS FINDINGS VASCULAR Aorta: Peripheral calcified atherosclerotic plaque involving the normal caliber abdominal aorta.  Celiac: The left gastric and right hepatic artery originate from a common trunk off of the aorta. Inferior and lateral is the origin of the splenic and left hepatic arteries, arising from a common trunk. SMA: Patent Renals: Single bilateral patent renal arteries IMA: Occluded. Inflow: Patent without evidence of aneurysm, dissection, vasculitis or significant stenosis. Mild atherosclerotic narrowing at the origin of the femoral arteries bilaterally. Atherosclerotic plaque involving the profunda femoral arteries bilaterally. Veins: No obvious venous abnormality within the limitations of this arterial phase study. Review of the MIP images confirms the above findings. NON-VASCULAR Hepatobiliary: Liver is normal in size and contour. Small stones/sludge within the gallbladder lumen. No gallbladder wall thickening or pericholecystic fluid. Pancreas: Unremarkable Spleen: Unremarkable Adrenals/Urinary Tract: Unchanged 2.6 cm left adrenal nodule. Unchanged 1.8 cm right adrenal nodule. The kidneys are atrophic bilaterally and lobular in contour. No hydronephrosis. Stomach/Bowel: No abnormal bowel wall thickening or evidence for bowel obstruction. No free fluid or free intraperitoneal air. Normal morphology of the stomach. Normal appendix. Lymphatic: No retroperitoneal lymphadenopathy. Reproductive: Prostate is not well visualized due to streak artifact. Other: None. Musculoskeletal: Left hip arthroplasty. Lumbar spine degenerative changes. No aggressive or acute appearing osseous lesions. Review of the MIP images confirms the above findings. IMPRESSION: 1. No evidence for acute thoracic or abdominal aortic dissection. 2. No acute process within the chest, abdomen or pelvis. 3. Atrophic kidneys bilaterally. 4. There is a 6 mm right lower lobe pulmonary nodule which may be increased in size from more remote exams and is difficult to compare with recent  exams due to differences in slice thickness however may be similar in size.  Recommend follow-up chest CT in 6 months to ensure stability. 5. Atherosclerotic narrowing at the origin of the left greater than right vertebral arteries. Electronically Signed   By: Lovey Newcomer M.D.   On: 09/30/2017 16:55   Ct Renal Stone Study  Result Date: 09/30/2017 CLINICAL DATA:  Flank pain.  Suspected urolithiasis. EXAM: CT ABDOMEN AND PELVIS WITHOUT CONTRAST TECHNIQUE: Multidetector CT imaging of the abdomen and pelvis was performed following the standard protocol without IV contrast. COMPARISON:  05/27/2011 FINDINGS: Lower chest: No acute findings. Hepatobiliary: No mass visualized on this unenhanced exam. Tiny calcified gallstones are noted, however there is no evidence of cholecystitis or biliary ductal dilatation. Pancreas: No mass or inflammatory process visualized on this unenhanced exam. Spleen:  Within normal limits in size. Adrenals/Urinary tract: Stable 2.6 cm left and 1.9 cm right adrenal adenomas are stable. No evidence of urolithiasis or hydronephrosis. Mild bilateral renal parenchymal scarring again seen. The unremarkable unopacified urinary bladder. Stomach/Bowel: No evidence of obstruction, inflammatory process, or abnormal fluid collections. Normal appendix visualized. Vascular/Lymphatic: No pathologically enlarged lymph nodes identified. No evidence of abdominal aortic aneurysm. Aortic atherosclerosis. Reproductive:  No mass or other significant abnormality. Other:  None. Musculoskeletal: No suspicious bone lesions identified. Left hip prosthesis noted. IMPRESSION: No evidence of urolithiasis, hydronephrosis, or other acute findings. Stable mild bilateral renal parenchymal scarring. Gallstones are seen, however there is no evidence of cholecystitis or biliary dilatation. Stable small benign bilateral adrenal adenomas. Electronically Signed   By: Earle Gell M.D.   On: 09/30/2017 16:10   Ct Head Code Stroke Wo Contrast  Result Date: 09/30/2017 CLINICAL DATA:  Code stroke.   Right-sided weakness EXAM: CT HEAD WITHOUT CONTRAST TECHNIQUE: Contiguous axial images were obtained from the base of the skull through the vertex without intravenous contrast. COMPARISON:  Brain MRI 10/17/2014 FINDINGS: Brain: No evidence of acute infarction, hemorrhage, hydrocephalus, extra-axial collection or mass lesion/mass effect. Moderate remote lateral right frontal infarct. Dense encephalomalacia of the bilateral inferior frontal lobes, posttraumatic pattern. Mild encephalomalacia in the right temporal pole. Advanced cortical atrophy, worse on the right where it is severe and progressed since 2016. Left cerebral atrophy is asymmetrically advanced at the vertex. 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 A proximal chronic high-grade stenosis is also considered. Small remote lateral left cerebellar infarct. Vascular: Atherosclerotic calcification.  No hyperdense vessel. Skull: Negative Sinuses/Orbits: Negative Other: These results were called by telephone at the time of interpretation on 09/30/2017 at 4:24 pm to Dr. Joni Fears , who verbally acknowledged these results. ASPECTS [facility] Stroke Program Early CT Score) -left hemisphere - Ganglionic level infarction (caudate, lentiform nuclei, internal capsule, insula, M1-M3 cortex): 7 - Supraganglionic infarction (M4-M6 cortex): 3 Total score (0-10 with 10 being normal): 10 IMPRESSION: 1. No hemorrhage or visible acute infarct. 2. Advanced cortical atrophy, particularly severe on the right, progressed from 2016-as above. 3. Bilateral inferior frontal and right temporal encephalomalacia, a posttraumatic pattern. 4. 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 Electronically Signed   By: Monte Fantasia M.D.   On: 09/30/2017 16:31   Ct Angio Abd/pel W And/or Wo Contrast  Result Date: 09/30/2017 CLINICAL DATA:  Patient with left arm and shoulder pain.  EXAM: CT ANGIOGRAPHY CHEST, ABDOMEN AND PELVIS TECHNIQUE: Multidetector CT imaging through the chest, abdomen and pelvis was performed using the standard protocol  during bolus administration of intravenous contrast. Multiplanar reconstructed images and MIPs were obtained and reviewed to evaluate the vascular anatomy. CONTRAST:  126mL ISOVUE-370 IOPAMIDOL (ISOVUE-370) INJECTION 76% COMPARISON:  CT chest 10/15/2014 FINDINGS: CTA CHEST FINDINGS Cardiovascular: Normal heart size. Trace pericardial fluid. Coronary arterial vascular calcifications. Thoracic aortic vascular calcifications. No focal area of peripheral high attenuation on noncontrast images within the thoracic aorta to suggest acute intramural hematoma. Post-contrast images demonstrate no acute thoracic aortic dissection. Ascending thoracic aorta measures 4 cm. Atherosclerotic narrowing at the origin of the left-greater-than-right vertebral arteries. Pulmonary artery is opacified. No evidence for central pulmonary embolus. Mediastinum/Nodes: No enlarged axillary, mediastinal or hilar lymphadenopathy. Normal esophagus. Lungs/Pleura: Central airways are patent. Dependent atelectasis within the bilateral lower lobes. Stable 2 mm nodule along the left fissure (image 90; series 7). Dependent atelectasis within the right lower lobe. There is a 6 mm right lower lobe nodule (image 66; series 7). This is slightly increased in size from more remote exams however may be similar to more recent exam, but difficult to compare given differences in slice thickness. No pleural effusion or pneumothorax. Musculoskeletal: Re-demonstrated old posterior left rib fractures. Read demonstrated old posterior right rib fractures. Unchanged T7 wedge compression deformity. Review of the MIP images confirms the above findings. CTA ABDOMEN AND PELVIS FINDINGS VASCULAR Aorta: Peripheral calcified atherosclerotic plaque involving the normal caliber abdominal aorta. Celiac: The left gastric  and right hepatic artery originate from a common trunk off of the aorta. Inferior and lateral is the origin of the splenic and left hepatic arteries, arising from a common trunk. SMA: Patent Renals: Single bilateral patent renal arteries IMA: Occluded. Inflow: Patent without evidence of aneurysm, dissection, vasculitis or significant stenosis. Mild atherosclerotic narrowing at the origin of the femoral arteries bilaterally. Atherosclerotic plaque involving the profunda femoral arteries bilaterally. Veins: No obvious venous abnormality within the limitations of this arterial phase study. Review of the MIP images confirms the above findings. NON-VASCULAR Hepatobiliary: Liver is normal in size and contour. Small stones/sludge within the gallbladder lumen. No gallbladder wall thickening or pericholecystic fluid. Pancreas: Unremarkable Spleen: Unremarkable Adrenals/Urinary Tract: Unchanged 2.6 cm left adrenal nodule. Unchanged 1.8 cm right adrenal nodule. The kidneys are atrophic bilaterally and lobular in contour. No hydronephrosis. Stomach/Bowel: No abnormal bowel wall thickening or evidence for bowel obstruction. No free fluid or free intraperitoneal air. Normal morphology of the stomach. Normal appendix. Lymphatic: No retroperitoneal lymphadenopathy. Reproductive: Prostate is not well visualized due to streak artifact. Other: None. Musculoskeletal: Left hip arthroplasty. Lumbar spine degenerative changes. No aggressive or acute appearing osseous lesions. Review of the MIP images confirms the above findings. IMPRESSION: 1. No evidence for acute thoracic or abdominal aortic dissection. 2. No acute process within the chest, abdomen or pelvis. 3. Atrophic kidneys bilaterally. 4. There is a 6 mm right lower lobe pulmonary nodule which may be increased in size from more remote exams and is difficult to compare with recent exams due to differences in slice thickness however may be similar in size. Recommend follow-up chest  CT in 6 months to ensure stability. 5. Atherosclerotic narrowing at the origin of the left greater than right vertebral arteries. Electronically Signed   By: Lovey Newcomer M.D.   On: 09/30/2017 16:55    Medications:  I have reviewed the patient's current medications. Scheduled: . ALPRAZolam  0.5 mg Oral TID  . aspirin  81 mg Oral Daily  . enoxaparin (LOVENOX) injection  40 mg Subcutaneous Q24H  . gabapentin  800 mg Oral  TID  . levETIRAcetam  500 mg Oral BID  . pantoprazole  40 mg Oral Daily  . sertraline  25 mg Oral Daily  . simvastatin  10 mg Oral Daily  . tiotropium  18 mcg Inhalation Daily    Assessment/Plan: Patient tolerating increase in Neurontin.  MRI of the brain reviewed and shows no acute changes but increase in infarcts noted from last evaluation.  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  Carotid dopplers show no evidence of hemodynamically significant stenosis on the right, 50-69% stenosis on the left.  Echocardiogram shows no cardiac source of emboli with an EF of 50-55%.  A1c 6.2, LDL 70. From review of chart patient evaluated at the end of last year and noted to have moderate to severe spinal stenosis at C2-4.  Am concerned this may actually be causing the patient's presentation.  Recommendations: 1.  CTA of neck.  No inpatient intervention required unless critical stenosis noted 2.  Patient to see neurosurgery for evaluation and need for surgical intervention.   3.  Continue ASA   LOS: 0 days   Alexis Goodell, MD Neurology [phone] 10/02/2017  10:33 AM

## 2017-10-02 NOTE — Discharge Summary (Signed)
Calvin [name] at [place] NAME: Calvin [name]    MR#:  [id number]  DATE OF BIRTH:  Jul 07, 1951  DATE OF ADMISSION:  09/30/2017   ADMITTING PHYSICIAN: Lance Coon, MD  DATE OF DISCHARGE: 10/02/2017  PRIMARY CARE PHYSICIAN: Lynnell Jude, MD   ADMISSION DIAGNOSIS:  Seizure disorder (Calvin [name]) [G40.909] Cystitis [N30.90] Acute pain of left shoulder [M25.512] Cerebrovascular accident (CVA), unspecified mechanism (Calvin [name]) [I63.9] DISCHARGE DIAGNOSIS:  Principal Problem:   Left-sided weakness Active Problems:   Seizures (Calvin [name])   HTN (hypertension)   CAD (coronary artery disease)   COPD (chronic obstructive pulmonary disease) (Calvin [name])   Depression with anxiety  SECONDARY DIAGNOSIS:   Past Medical History:  Diagnosis Date  . Alcohol abuse    drinks on weekend  . Anemia   . Anxiety   . Arthritis   . Cancer (Calvin [name])    colon,throat  . COPD (chronic obstructive pulmonary disease) (Calvin [name])   . Coronary artery disease   . Depression   . Gout   . Hypertension   . Myocardial infarction (Calvin [name])   . Neuromuscular disorder (Calvin [name])   . Seizures (Calvin [name])    last 6 months ago  . Stroke [facility])    multiple  left side weakness  . Tremors of nervous system    HOSPITAL COURSE:  67 y.o.malewith a history of stroke presenting with left-sided symptoms including numbness and weakness.   * Left side weakness and numbness: symptoms off & on for last 2 weeks - CT head neg - MRI, MRA of the brain neg for acute patho - PT recommends STR/SNF - Increased Neurontin to 800mg  TID  *Seizures (Calvin [name]) -continue home dose Keppra, seizure precautions  * Hypotension with h/oHTN (hypertension) -monitor  *CAD (coronary artery disease) -continue home medications  *COPD (chronic obstructive pulmonary disease) (Calvin [name]) -continue home dose inhalers  *Depression with anxiety -continue zoloft and xanax DISCHARGE CONDITIONS:  fair CONSULTS OBTAINED:  Treatment Team:    Alexis Goodell, MD DRUG ALLERGIES:  No Known Allergies DISCHARGE MEDICATIONS:   Allergies as of 10/02/2017   No Known Allergies     Medication List    STOP taking these medications   gabapentin 600 MG tablet Commonly known as:  NEURONTIN Replaced by:  gabapentin 400 MG capsule   predniSONE 5 MG tablet Commonly known as:  DELTASONE     TAKE these medications   acetaminophen 500 MG tablet Commonly known as:  TYLENOL Take 500 mg by mouth 3 (three) times daily.   ALPRAZolam 0.5 MG tablet Commonly known as:  XANAX Take 1 tablet (0.5 mg total) by mouth 3 (three) times daily.   aspirin 81 MG tablet Take 81 mg by mouth daily.   BENEFIBER DRINK MIX PO Take 1 Dose by mouth every other day.   cyclobenzaprine 10 MG tablet Commonly known as:  FLEXERIL Take 1 tablet (10 mg total) by mouth 2 (two) times daily as needed for muscle spasms.   ergocalciferol 50000 units capsule Commonly known as:  VITAMIN D2 Take 50,000 Units by mouth every 30 (thirty) days.   gabapentin 400 MG capsule Commonly known as:  NEURONTIN Take 2 capsules (800 mg total) by mouth 3 (three) times daily. Replaces:  gabapentin 600 MG tablet   guaifenesin 100 MG/5ML syrup Commonly known as:  ROBITUSSIN Take 200 mg by mouth 3 (three) times daily.   HYDROcodone-acetaminophen 5-325 MG tablet Commonly known as:  NORCO Take 1 tablet by mouth every 6 (six) hours as needed for  moderate pain or severe pain. What changed:  reasons to take this   levETIRAcetam 500 MG tablet Commonly known as:  KEPPRA Take 500 mg by mouth 2 (two) times daily.   lisinopril 5 MG tablet Commonly known as:  PRINIVIL,ZESTRIL Take 7.5 mg by mouth daily.   MYLANTA PO Take 30 mLs by mouth every 12 (twelve) hours as needed.   nicotine polacrilex 2 MG gum Commonly known as:  NICORETTE Take 2 mg by mouth every 4 (four) hours as needed for smoking cessation.   nitroGLYCERIN 0.4 MG SL tablet Commonly known as:  NITROSTAT Place  0.4 mg under the tongue every 5 (five) minutes as needed for chest pain.   omeprazole 20 MG capsule Commonly known as:  PRILOSEC Take 20 mg by mouth daily.   ondansetron 4 MG tablet Commonly known as:  ZOFRAN Take 4 mg by mouth every 8 (eight) hours as needed for nausea or vomiting.   polyethylene glycol packet Commonly known as:  MIRALAX / GLYCOLAX Take 17 g by mouth every other day.   sertraline 25 MG tablet Commonly known as:  ZOLOFT Take 25 mg by mouth daily.   simvastatin 10 MG tablet Commonly known as:  ZOCOR Take 10 mg by mouth daily.   tiotropium 18 MCG inhalation capsule Commonly known as:  SPIRIVA Place 18 mcg into inhaler and inhale daily.        DISCHARGE INSTRUCTIONS:   DIET:  Regular diet DISCHARGE CONDITION:  Good ACTIVITY:  Activity as tolerated OXYGEN:  Home Oxygen: No.  Oxygen Delivery: room air DISCHARGE LOCATION:  nursing home   If you experience worsening of your admission symptoms, develop shortness of breath, life threatening emergency, suicidal or homicidal thoughts you must seek medical attention immediately by calling 911 or calling your MD immediately  if symptoms less severe.  You Must read complete instructions/literature along with all the possible adverse reactions/side effects for all the Medicines you take and that have been prescribed to you. Take any new Medicines after you have completely understood and accpet all the possible adverse reactions/side effects.   Please note  You were cared for by a hospitalist during your hospital stay. If you have any questions about your discharge medications or the care you received while you were in the hospital after you are discharged, you can call the unit and asked to speak with the hospitalist on call if the hospitalist that took care of you is not available. Once you are discharged, your primary care physician will handle any further medical issues. Please note that NO REFILLS for any  discharge medications will be authorized once you are discharged, as it is imperative that you return to your primary care physician (or establish a relationship with a primary care physician if you do not have one) for your aftercare needs so that they can reassess your need for medications and monitor your lab values.    On the day of Discharge:  VITAL SIGNS:  Blood pressure 120/74, pulse 88, temperature 98.8 F (37.1 C), temperature source Oral, resp. rate 18, height 5\' 11"  (1.803 m), weight 96.1 kg (211 lb 13.8 oz), SpO2 96 %. PHYSICAL EXAMINATION:  GENERAL:  67 y.o.-year-old patient lying in the bed with no acute distress.  EYES: Pupils equal, round, reactive to light and accommodation. No scleral icterus. Extraocular muscles intact.  HEENT: Head atraumatic, normocephalic. Oropharynx and nasopharynx clear.  NECK:  Supple, no jugular venous distention. No thyroid enlargement, no tenderness.  LUNGS: Normal breath  sounds bilaterally, no wheezing, rales,rhonchi or crepitation. No use of accessory muscles of respiration.  CARDIOVASCULAR: S1, S2 normal. No murmurs, rubs, or gallops.  ABDOMEN: Soft, non-tender, non-distended. Bowel sounds present. No organomegaly or mass.  EXTREMITIES: No pedal edema, cyanosis, or clubbing.  NEUROLOGIC: Cranial nerves II through XII are intact. Muscle strength 5/5 in all extremities. Sensation intact. Gait not checked.  PSYCHIATRIC: The patient is alert and oriented x 3.  SKIN: No obvious rash, lesion, or ulcer.  DATA REVIEW:   CBC Recent Labs  Lab 10/02/17 0656  WBC 4.6  HGB 14.5  HCT 44.5  PLT 276    Chemistries  Recent Labs  Lab 10/02/17 0656  NA 136  K 4.1  CL 103  CO2 25  GLUCOSE 93  BUN 19  CREATININE 1.38*  CALCIUM 9.0     Microbiology Results  Results for orders placed or performed during the hospital encounter of 09/30/17  MRSA PCR Screening     Status: None   Collection Time: 09/30/17 10:58 PM  Result Value Ref Range Status     MRSA by PCR NEGATIVE NEGATIVE Final    Comment:        The GeneXpert MRSA Assay (FDA approved for NASAL specimens only), is one component of a comprehensive MRSA colonization surveillance program. It is not intended to diagnose MRSA infection nor to guide or monitor treatment for MRSA infections. Performed at [facility], [place], [place] [address]     RADIOLOGY:  Ct Angio Neck W Or Wo Contrast  Result Date: 10/02/2017 CLINICAL DATA:  67 year old male presenting as code stroke 2 days ago. Chronic ischemic disease and evidence of occluded distal left vertebral artery but no acute infarct on brain MRI yesterday. EXAM: CT ANGIOGRAPHY NECK TECHNIQUE: Multidetector CT imaging of the neck was performed using the standard protocol during bolus administration of intravenous contrast. Multiplanar CT image reconstructions and MIPs were obtained to evaluate the vascular anatomy. Carotid stenosis measurements (when applicable) are obtained utilizing NASCET criteria, using the distal internal carotid diameter as the denominator. CONTRAST:  4mL ISOVUE-370 IOPAMIDOL (ISOVUE-370) INJECTION 76% COMPARISON:  Brain MRI 10/01/2017. Carotid Doppler ultrasound 10/01/2017. Next CT with contrast 03/18/2012. FINDINGS: Skeleton: Absent dentition. Widespread cervical spine degeneration including endplate and facet spurring greater on the right. Chronic fracture deformity of the medial left clavicle. No acute osseous abnormality identified. Upper chest: No superior mediastinal lymphadenopathy. Negative visible upper lungs. Other neck: Negative. Some bilateral parotid and submandibular gland atrophy since 2013. No cervical lymphadenopathy. Aortic arch: Minimal arch atherosclerosis. Bovine type arch configuration. Incidental prominent bronchial artery origin or infundibulum from the descending aorta on series 4, image 13, unchanged. Right carotid system: Mild soft right CCA origin without plaque  in the brachiocephalic stenosis artery and. Mildly tortuous right CCA. Lateral soft plaque within the right CCA at the level of the larynx without stenosis. Widely patent right carotid bifurcation. Distal to the right ICA bulb there is mild circumferential narrowing of the vessel to a diameter of about 3 millimeters, and then gradual enlargement of the vessel lumen to a more normal diameter of 5 millimeters. This may be related to circumferential soft plaque. There is no focal stenosis. See series 11, image 44. The visible right ICA siphon is patent. The right ICA terminus is patent, the right ACA A1 segment appears non dominant and diminutive or absent. Left carotid system: Minimal soft plaque at the left CCA bovine type origin with no stenosis. Mild tortuosity of the  proximal left CC Minimal soft and calcified plaque at the left ICA origin and bulb. More bulky low-density soft plaque in the medial vessel just distal to the bulb. Subsequent stenosis is 50 % with respect to the distal vessel. Tortuosity of the vessel just distal to this level with a mildly kinked appearance. Mild soft and calcified plaque in the vessel just below the skull base without stenosis. Patent left ICA siphon without stenosis. Patent left ICA terminus with normal left MCA and ACA origins. Vertebral arteries: No proximal right subclavian artery stenosis despite soft and calcified plaque. There is calcified plaque at the right vertebral artery origin resulting in mild to moderate stenosis. The right vertebral artery is non dominant, but is patent to the vertebrobasilar junction without stenosis. No proximal left subclavian artery stenosis with minimal plaque. Bulky calcified plaque at the and in the proximal V1 left vertebral artery origin segment resulting in severe stenosis, radiographic string sign. A diminutive distal V1 segment remains patent. The left V2 segment has a more normal caliber and remains patent to the skull base without  additional stenosis. In the left V4 segment the left PICA origin remains patent, but there has been radiographic string sign stenosis of the left V4 along a segment of 3 millimeters (series 6, images 55-51 and series 10, image 27) before there improved patency of the distal most V4 segment and left vertebrobasilar junction. The basilar artery is patent without stenosis. The basilar tip and PCA origins are patent. Review of the MIP images confirms the above findings IMPRESSION: 1. 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 The distal stenosis is just beyond the patent left PICA origin. 2. Calcified plaque at the right vertebral artery origin resulting in mild to moderate stenosis, but no other right vertebral stenosis. 3. Soft, low-density plaque in both cervical ICAs distal to the bulbs. On the right a gradual mild tapering results without significant stenosis, while on the left a more focal 50% stenosis results. Electronically Signed   By: Genevie Ann M.D.   On: 10/02/2017 11:51   US Carotid Bilateral (at [facility] Only)  Result Date: 10/01/2017 CLINICAL DATA:  Left-sided weakness EXAM: BILATERAL CAROTID DUPLEX ULTRASOUND TECHNIQUE: Pearline Cables scale imaging, color Doppler and duplex ultrasound were performed of bilateral carotid and vertebral arteries in the neck. COMPARISON:  None. FINDINGS: Criteria: Quantification of carotid stenosis is based on velocity parameters that correlate the residual internal carotid diameter with NASCET-based stenosis levels, using the diameter of the distal internal carotid lumen as the denominator for stenosis measurement. The following velocity measurements were obtained: RIGHT ICA:  111 cm/sec CCA:  71 cm/sec SYSTOLIC ICA/CCA RATIO:  1.6 DIASTOLIC ICA/CCA RATIO:  2.4 ECA:  64 cm/sec LEFT ICA:  136 cm/sec CCA:  51 cm/sec SYSTOLIC ICA/CCA RATIO:  2.7 DIASTOLIC ICA/CCA RATIO:  3.7 ECA:  106 cm/sec RIGHT CAROTID ARTERY:  Moderate smooth soft plaque in the bulb. Low resistance internal carotid Doppler pattern is preserved. RIGHT VERTEBRAL ARTERY:  Antegrade. LEFT CAROTID ARTERY: There is prominent mixed plaque in the bulb. Low resistance internal carotid Doppler pattern is preserved the internal carotid is tortuous. LEFT VERTEBRAL ARTERY:  Antegrade. IMPRESSION: Less than 50% stenosis in the right internal carotid artery. 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 Electronically Signed   By: Marybelle Killings M.D.   On: 10/01/2017 15:23     Management plans discussed with the patient, family and they are in  agreement.  CODE STATUS: Full Code   TOTAL TIME TAKING CARE OF THIS PATIENT: 45 minutes.    Calvin [name] M.D on 10/02/2017 at 2:18 PM  Between 7am to 6pm - Pager - [phone]  After 6pm go to [email or web address] - Proofreader  [facility]  Hospitalists  Office  [phone]  CC: Primary care physician; Lynnell Jude, MD   Note: This dictation was prepared with Dragon dictation along with smaller phrase technology. Any transcriptional errors that result from this process are unintentional.

## 2017-10-02 NOTE — Evaluation (Signed)
Occupational Therapy Evaluation Patient Details Name: Calvin [name] MRN: [id number] DOB: 12-01-1950 Today's Date: 10/02/2017    History of Present Illness Pt is a 67 y.o.malewho presents with left arm pain and subsequent left arm and leg weakness with left facial numbness. Initial exam in the ED is consistent with possible stroke. Hospitalist called for admission and further evaluation.  Assessment includes: L-sided weakness, seizures, HTN, CAD, COPD, previous CVA with L-sided deficits, and depression/anxiety.     Clinical Impression   Pt seen for OT evaluation this date. At baseline prior to admission pt had some L sided weakness and sensory deficits from previous stroke. Pt presents with increased deficits in strength, coordination, and significant L sided pain (UE>LE) that pt describes as "sharp, shooting, comes and goes." Pt requires min assist for opening packets/containers for meals, grooming, and LB ADL tasks. Pt declined bed mobility and out of bed with OT this am due to significant pain. Pt notes RN provided pain medication recently. Pt will benefit from skilled OT services to address noted impairments and functional deficits in order to maximize return to PLOF, minimize risk of falls/functional decline/increased caregiver burden. Recommend return to LTC facility with STR.     Follow Up Recommendations  SNF    Equipment Recommendations  Other (comment)(TBD at next venue of care)    Recommendations for Other Services       Precautions / Restrictions Precautions Precautions: Fall;Other (comment) Precaution Comments: h/o seizures Restrictions Weight Bearing Restrictions: No      Mobility Bed Mobility               General bed mobility comments: pt declined bed mobility this date due to LUE pain  Transfers                 General transfer comment: pt declined out of bed this date due to LUE pain    Balance Overall balance assessment: (unable to fully  assess balance this date due to pt declining mobility)                                         ADL either performed or assessed with clinical judgement   ADL Overall ADL's : Needs assistance/impaired Eating/Feeding: Sitting;Set up Eating/Feeding Details (indicate cue type and reason): difficulty with opening packets/containers due to decreased purposeful grip in L hand Grooming: Sitting;Set up   Upper Body Bathing: Sitting;Minimal assistance       Upper Body Dressing : Sitting;Minimal assistance   Lower Body Dressing: Sit to/from stand;Minimal assistance;Moderate assistance                 General ADL Comments: pt declined out of bed this date     Vision Baseline Vision/History: Wears glasses Wears Glasses: Reading only Patient Visual Report: No change from baseline       Perception     Praxis      Pertinent Vitals/Pain Pain Assessment: 0-10 Pain Score: 9  Pain Location: L side of body, L arm worse Pain Descriptors / Indicators: Aching;Sore Pain Intervention(s): Limited activity within patient's tolerance;Monitored during session;Repositioned;Premedicated before session     Hand Dominance Right   Extremity/Trunk Assessment Upper Extremity Assessment Upper Extremity Assessment: LUE deficits/detail(RUE WFL) LUE Deficits / Details: 3/5 shoulder flexion, 3+/5 elbow/wrist, 4/5 grip; impaired FMC/sensation with testing, pt reports [place] and strength deficits are slightly worse than previous baseline LUE: Unable to  fully assess due to pain LUE Sensation: decreased light touch(impaired sensation is baseline) LUE Coordination: decreased fine motor;decreased gross motor(minimally increased deficits compared to baseline)   Lower Extremity Assessment Lower Extremity Assessment: Defer to PT evaluation;LLE deficits/detail(RLE WFL) LLE Deficits / Details: grossly 4/5, weaker slightly compared to baseline LLE Coordination: decreased gross motor   Cervical  / Trunk Assessment Cervical / Trunk Assessment: Normal   Communication Communication Communication: No difficulties   Cognition Arousal/Alertness: Awake/alert Behavior During Therapy: WFL for tasks assessed/performed Overall Cognitive Status: Within Functional Limits for tasks assessed                                     General Comments       Exercises     Shoulder Instructions      Home Living Family/patient expects to be discharged to:: Other (Comment)([facility])                             Home Equipment: Walker - 2 wheels;Walker - 4 wheels          Prior Functioning/Environment Level of Independence: Needs assistance  Gait / Transfers Assistance Needed: Mod Ind amb facility distances with RW, used rollator in the community when daughter takes him out, one fall in the last year when brake wasn't locked on rollator while reaching outside BOS ADL's / Homemaking Assistance Needed: Pt reports staff at [facility] facility assists with meals and meds and limited LB dressing but mostly Ind with bathing and dressing. Staff use lift for tub transfers            OT Problem List: Decreased strength;Decreased knowledge of use of DME or AE;Decreased range of motion;Decreased coordination;Decreased activity tolerance;Impaired UE functional use;Pain;Impaired sensation      OT Treatment/Interventions: Self-care/ADL training;Balance training;Therapeutic exercise;Therapeutic activities;DME and/or AE instruction;Patient/family education    OT Goals(Current goals can be found in the care plan section) Acute Rehab OT Goals Patient Stated Goal: get back to PLOF OT Goal Formulation: With patient Time For Goal Achievement: 10/16/17 Potential to Achieve Goals: Good ADL Goals Pt Will Perform Lower Body Dressing: with set-up;with min guard assist;with adaptive equipment;sit to/from stand Pt Will Transfer to Toilet: with min guard assist;ambulating(LRAD for  ambulation, comfort height toilet)  OT Frequency: Min 2X/week   Barriers to D/C:            Co-evaluation              AM-PAC PT "6 Clicks" Daily Activity     Outcome Measure Help from another person eating meals?: A Little Help from another person taking care of personal grooming?: A Little Help from another person toileting, which includes using toliet, bedpan, or urinal?: A Little Help from another person bathing (including washing, rinsing, drying)?: A Little Help from another person to put on and taking off regular upper body clothing?: A Little Help from another person to put on and taking off regular lower body clothing?: A Lot 6 Click Score: 17   End of Session    Activity Tolerance: Patient limited by pain Patient left: in bed;with call bell/phone within reach;with bed alarm set  OT Visit Diagnosis: Other abnormalities of gait and mobility (R26.89);Muscle weakness (generalized) (M62.81);Pain Pain - Right/Left: Left Pain - part of body: Shoulder;Arm;Hand;Hip;Knee;Leg;Ankle and joints of foot  Time: 0233-4356 OT Time Calculation (min): 17 min Charges:  OT General Charges $OT Visit: 1 Visit OT Evaluation $OT Eval Moderate Complexity: 1 Mod  Jeni Salles, MPH, MS, OTR/L ascom [phone] 10/02/17, 10:20 AM

## 2018-02-17 ENCOUNTER — Emergency Department
Admission: EM | Admit: 2018-02-17 | Discharge: 2018-02-17 | Disposition: A | Discharge status: Rehabilitation Facility | Payer: Medicare Other | Attending: Student in an Organized Health Care Education/Training Program | Admitting: Student in an Organized Health Care Education/Training Program

## 2018-02-17 ENCOUNTER — Other Ambulatory Visit: Payer: Self-pay

## 2018-02-17 ENCOUNTER — Encounter: Payer: Self-pay | Admitting: *Deleted

## 2018-02-17 ENCOUNTER — Emergency Department: Payer: Medicare Other

## 2018-02-17 DIAGNOSIS — Z7982 Long term (current) use of aspirin: Secondary | ICD-10-CM | POA: Diagnosis not present

## 2018-02-17 DIAGNOSIS — I2510 Atherosclerotic heart disease of native coronary artery without angina pectoris: Secondary | ICD-10-CM | POA: Insufficient documentation

## 2018-02-17 DIAGNOSIS — Z96642 Presence of left artificial hip joint: Secondary | ICD-10-CM | POA: Diagnosis not present

## 2018-02-17 DIAGNOSIS — F17200 Nicotine dependence, unspecified, uncomplicated: Secondary | ICD-10-CM | POA: Diagnosis not present

## 2018-02-17 DIAGNOSIS — M25512 Pain in left shoulder: Secondary | ICD-10-CM

## 2018-02-17 DIAGNOSIS — J449 Chronic obstructive pulmonary disease, unspecified: Secondary | ICD-10-CM | POA: Diagnosis not present

## 2018-02-17 DIAGNOSIS — Z79899 Other long term (current) drug therapy: Secondary | ICD-10-CM | POA: Diagnosis not present

## 2018-02-17 DIAGNOSIS — I10 Essential (primary) hypertension: Secondary | ICD-10-CM | POA: Insufficient documentation

## 2018-02-17 LAB — BASIC METABOLIC PANEL
ANION GAP: 8 (ref 5–15)
BUN: 15 mg/dL (ref 6–20)
CALCIUM: 8.8 mg/dL — AB (ref 8.9–10.3)
CHLORIDE: 106 mmol/L (ref 101–111)
CO2: 23 mmol/L (ref 22–32)
Creatinine, Ser: 1.16 mg/dL (ref 0.61–1.24)
GFR calc Af Amer: >60 mL/min (ref >60)
GFR calc non Af Amer: >60 mL/min (ref >60)
GLUCOSE: 88 mg/dL (ref 65–99)
POTASSIUM: 4.6 mmol/L (ref 3.5–5.1)
Sodium: 137 mmol/L (ref 135–145)

## 2018-02-17 LAB — CBC WITH DIFFERENTIAL/PLATELET
BASOS ABS: 0 10*3/uL (ref 0–0.1)
Basophils Relative: 1 %
Eosinophils Absolute: 0.1 10*3/uL (ref 0–0.7)
Eosinophils Relative: 2 %
HEMATOCRIT: 40 % (ref 40.0–52.0)
HEMOGLOBIN: 13.7 g/dL (ref 13.0–18.0)
LYMPHS PCT: 29 %
Lymphs Abs: 1.1 10*3/uL (ref 1.0–3.6)
MCH: 31.7 pg (ref 26.0–34.0)
MCHC: 34.2 g/dL (ref 32.0–36.0)
MCV: 92.7 fL (ref 80.0–100.0)
MONO ABS: 0.3 10*3/uL (ref 0.2–1.0)
MONOS PCT: 8 %
NEUTROS ABS: 2.4 10*3/uL (ref 1.4–6.5)
Neutrophils Relative %: 60 %
Platelets: 209 10*3/uL (ref 150–440)
RBC: 4.32 MIL/uL — ABNORMAL LOW (ref 4.40–5.90)
RDW: 13.5 % (ref 11.5–14.5)
WBC: 3.9 10*3/uL (ref 3.8–10.6)

## 2018-02-17 LAB — TROPONIN I: Troponin I: <0.03 ng/mL (ref <0.03)

## 2018-02-17 MED ORDER — LIDOCAINE 5 % EX PTCH: 1.0000 | MEDICATED_PATCH | Freq: Two times a day (BID) | CUTANEOUS | 0 refills | Status: AC

## 2018-02-17 MED ORDER — LIDOCAINE 5 % EX PTCH
1.0000 | MEDICATED_PATCH | CUTANEOUS | Status: DC
  Administered 2018-02-17: 1 via TRANSDERMAL
  Filled 2018-02-17: qty 1

## 2018-02-17 NOTE — ED Triage Notes (Signed)
Per EMS report, patient has chronic left shoulder/side pain and numbness which "flared up today." Patient was given Xanax at [facility]. Patient is due for a Cortisone injection this coming Tuesday.

## 2018-02-17 NOTE — ED Notes (Addendum)
This tech answered call bell to get pt into a new, dry brief.

## 2018-02-17 NOTE — ED Provider Notes (Signed)
[facility] Emergency Department Provider Note    First MD Initiated Contact with Patient 02/17/18 1521     (approximate)  I have reviewed the triage vital signs and the nursing notes.   HISTORY  Chief Complaint Pain    HPI Calvin [name] is a 67 y.o. male with a history of chronic left-sided shoulder pain presents the ER with similar shoulder pain that started suddenly this morning.  She does take Xanax and was given a Xanax prior to arrival with improvement in symptoms.  Denies any pain at this time.  States this is a recurrent issue.  Denies any trauma.  No fevers.  Denies any chest pain or shortness of breath at this time but does feel that the pain episodes are becoming more frequent.  He scheduled for joint injection this coming week.  Denies any recent antibiotics.  No other symptoms or discomfort at this time.    Past Medical History:  Diagnosis Date  . Alcohol abuse    drinks on weekend  . Anemia   . Anxiety   . Arthritis   . Cancer ([place])    colon,throat  . COPD (chronic obstructive pulmonary disease) ([place])   . Coronary artery disease   . Depression   . Gout   . Hypertension   . Myocardial infarction ([place])   . Neuromuscular disorder ([place])   . Seizures ([place])    last 6 months ago  . Stroke [facility])    multiple  left side weakness  . Tremors of nervous system    Family History  Problem Relation Age of Onset  . Cancer Mother   . Hypertension Father   . Leukemia Brother    Past Surgical History:  Procedure Laterality Date  . CARPAL TUNNEL RELEASE Left 10/19/2015   Procedure: CARPAL TUNNEL RELEASE;  Surgeon: Hessie Knows, MD;  Location: [facility] ORS;  Service: Orthopedics;  Laterality: Left;  . COLON SURGERY    . JOINT REPLACEMENT     left partial hip   . THROAT SURGERY  2013   cancer   Patient Active Problem List   Diagnosis Date Noted  . Left-sided weakness 09/30/2017  . Seizures ([place]) 09/30/2017  . HTN (hypertension) 09/30/2017  .  CAD (coronary artery disease) 09/30/2017  . COPD (chronic obstructive pulmonary disease) ([place]) 09/30/2017  . Depression with anxiety 09/30/2017      Prior to Admission medications   Medication Sig Start Date End Date Taking? Authorizing Provider  acetaminophen (TYLENOL) 500 MG tablet Take 500 mg by mouth 3 (three) times daily.    [provider]  ALPRAZolam Duanne Moron) 0.5 MG tablet Take 1 tablet (0.5 mg total) by mouth 3 (three) times daily. 10/02/17   Max Sane, MD  Alum & Mag Hydroxide-Simeth (MYLANTA PO) Take 30 mLs by mouth every 12 (twelve) hours as needed.    [provider]  aspirin 81 MG tablet Take 81 mg by mouth daily.    [provider]  cyclobenzaprine (FLEXERIL) 10 MG tablet Take 1 tablet (10 mg total) by mouth 2 (two) times daily as needed for muscle spasms. 09/30/17   Merlyn Lot, MD  ergocalciferol (VITAMIN D2) 50000 units capsule Take 50,000 Units by mouth every 30 (thirty) days.    [provider]  gabapentin (NEURONTIN) 400 MG capsule Take 2 capsules (800 mg total) by mouth 3 (three) times daily. 10/02/17   Max Sane, MD  guaifenesin (ROBITUSSIN) 100 MG/5ML syrup Take 200 mg by mouth 3 (three)  times daily.    [provider]  HYDROcodone-acetaminophen (NORCO) 5-325 MG tablet Take 1 tablet by mouth every 6 (six) hours as needed for moderate pain or severe pain. 10/02/17   Max Sane, MD  levETIRAcetam (KEPPRA) 500 MG tablet Take 500 mg by mouth 2 (two) times daily.     [provider]  lisinopril (PRINIVIL,ZESTRIL) 5 MG tablet Take 7.5 mg by mouth daily.    [provider]  nicotine polacrilex (NICORETTE) 2 MG gum Take 2 mg by mouth every 4 (four) hours as needed for smoking cessation.    [provider]  nitroGLYCERIN (NITROSTAT) 0.4 MG SL tablet Place 0.4 mg under the tongue every 5 (five) minutes as needed for chest pain.    [provider]  omeprazole (PRILOSEC) 20 MG capsule Take 20 mg by  mouth daily.    [provider]  ondansetron (ZOFRAN) 4 MG tablet Take 4 mg by mouth every 8 (eight) hours as needed for nausea or vomiting.    [provider]  polyethylene glycol (MIRALAX / GLYCOLAX) packet Take 17 g by mouth every other day.     [provider]  sertraline (ZOLOFT) 25 MG tablet Take 25 mg by mouth daily.    [provider]  simvastatin (ZOCOR) 10 MG tablet Take 10 mg by mouth daily.    [provider]  tiotropium (SPIRIVA) 18 MCG inhalation capsule Place 18 mcg into inhaler and inhale daily.    [provider]  Wheat Dextrin (BENEFIBER DRINK MIX PO) Take 1 Dose by mouth every other day.    [provider]    Allergies Patient has no known allergies.    Social History Social History   Tobacco Use  . Smoking status: Current Every Day Smoker  . Smokeless tobacco: Never Used  Substance Use Topics  . Alcohol use: Yes    Comment: weekends  . Drug use: No    Review of Systems Patient denies headaches, rhinorrhea, blurry vision, numbness, shortness of breath, chest pain, edema, cough, abdominal pain, nausea, vomiting, diarrhea, dysuria, fevers, rashes or hallucinations unless otherwise stated above in HPI. ____________________________________________   PHYSICAL EXAM:  VITAL SIGNS: Vitals:   02/17/18 1517  BP: 131/82  Pulse: 62  Resp: 18  Temp: 97.8 F (36.6 C)  SpO2: 98%    Constitutional: Alert and oriented.  Eyes: Conjunctivae are normal.  Head: Atraumatic. Nose: No congestion/rhinnorhea. Mouth/Throat: Mucous membranes are moist.   Neck: No stridor. Painless ROM.  Cardiovascular: Normal rate, regular rhythm. Grossly normal heart sounds.  Good peripheral circulation. Respiratory: Normal respiratory effort.  No retractions. Lungs CTAB. Gastrointestinal: Soft and nontender. No distention. No abdominal bruits. No CVA tenderness. Genitourinary:  Musculoskeletal: No lower extremity tenderness  nor edema.  No joint effusions.  No obvious deformity to BUE.  2+  Pulses BUE Neurologic:  CN- intact.  No facial droop, Normal FNF.  Normal heel to shin.  Sensation intact bilaterally. Normal speech and language. No gross focal neurologic deficits are appreciated. No gait instability. Skin:  Skin is warm, dry and intact. No rash noted. Psychiatric: Mood and affect are normal. Speech and behavior are normal.  ____________________________________________   LABS (all labs ordered are listed, but only abnormal results are displayed)  Results for orders placed or performed during the hospital encounter of 02/17/18 (from the past 24 hour(s))  CBC with Differential/Platelet     Status: Abnormal   Collection Time: 02/17/18  5:27 PM  Result Value Ref Range  WBC 3.9 3.8 - 10.6 K/uL   RBC 4.32 (L) 4.40 - 5.90 MIL/uL   Hemoglobin 13.7 13.0 - 18.0 g/dL   HCT 40.0 40.0 - 52.0 %   MCV 92.7 80.0 - 100.0 fL   MCH 31.7 26.0 - 34.0 pg   MCHC 34.2 32.0 - 36.0 g/dL   RDW 13.5 11.5 - 14.5 %   Platelets 209 150 - 440 K/uL   Neutrophils Relative % 60 %   Neutro Abs 2.4 1.4 - 6.5 K/uL   Lymphocytes Relative 29 %   Lymphs Abs 1.1 1.0 - 3.6 K/uL   Monocytes Relative 8 %   Monocytes Absolute 0.3 0.2 - 1.0 K/uL   Eosinophils Relative 2 %   Eosinophils Absolute 0.1 0 - 0.7 K/uL   Basophils Relative 1 %   Basophils Absolute 0.0 0 - 0.1 K/uL  Basic metabolic panel     Status: Abnormal   Collection Time: 02/17/18  5:27 PM  Result Value Ref Range   Sodium 137 135 - 145 mmol/L   Potassium 4.6 3.5 - 5.1 mmol/L   Chloride 106 101 - 111 mmol/L   CO2 23 22 - 32 mmol/L   Glucose, Bld 88 65 - 99 mg/dL   BUN 15 6 - 20 mg/dL   Creatinine, Ser 1.16 0.61 - 1.24 mg/dL   Calcium 8.8 (L) 8.9 - 10.3 mg/dL   GFR calc non Af Amer >60 >60 mL/min   GFR calc Af Amer >60 >60 mL/min   Anion gap 8 5 - 15  Troponin I     Status: None   Collection Time: 02/17/18  5:27 PM  Result Value Ref Range   Troponin I <0.03 <0.03  ng/mL   ____________________________________________  EKG My review and personal interpretation at Time: 16:03   Indication: shoulder pain  Rate: 60  Rhythm: sinus Axis: normal Other: normal intervals, no stemi ____________________________________________  RADIOLOGY  I personally reviewed all radiographic images ordered to evaluate for the above acute complaints and reviewed radiology reports and findings.  These findings were personally discussed with the patient.  Please see medical record for radiology report.  ____________________________________________   PROCEDURES  Procedure(s) performed:  Procedures    Critical Care performed: no ____________________________________________   INITIAL IMPRESSION / ASSESSMENT AND PLAN / ED COURSE  Pertinent labs & imaging results that were available during my care of the patient were reviewed by me and considered in my medical decision making (see chart for details).   DDX: arthritis, radiculopathy, dissection, acs, msk strain  TAELYN NEMES is a 67 y.o. who presents to the ED with symptoms as described above.  Patient with nonfocal neuro exam at this time.  Is complaining of left shoulder pain that he states he has had multiple episodes of the past.  States he gets injections in his neck for this.  States that the pain is gotten better.  Denies any numbness or tingling at this time.  Denies any chest pain or shortness of breath.  Based on his risk factors will order blood work to evaluate for the above differential.  Does not seem clinically consistent with CVA or dissection.  Will provide topical pain medication and reassess. Clinical Course as of Feb 17 1814  Sun Feb 17, 2018  1813 Patient reassessed.  Repeat exam nonfocal.  Has good strength in bilateral upper extremities and lower extremities.  No facial droop.  States he feels much better after Lidoderm patch.  States the pain is currently absent.  States that he is ready to go back  to facility.   [PR]    Clinical Course User Index [PR] Merlyn Lot, MD     As part of my medical decision making, I reviewed the following data within the [place] notes reviewed and incorporated, Labs reviewed, notes from prior ED visits and  Controlled Substance Database   ____________________________________________   FINAL CLINICAL IMPRESSION(S) / ED DIAGNOSES  Final diagnoses:  Acute pain of left shoulder      NEW MEDICATIONS STARTED DURING THIS VISIT:  New Prescriptions   No medications on file     Note:  This document was prepared using Dragon voice recognition software and may include unintentional dictation errors.    Merlyn Lot, MD 02/17/18 [phone]

## 2018-10-24 ENCOUNTER — Other Ambulatory Visit: Payer: Self-pay | Admitting: Physical Medicine and Rehabilitation

## 2018-10-24 ENCOUNTER — Other Ambulatory Visit (HOSPITAL_COMMUNITY): Payer: Self-pay | Admitting: Physical Medicine and Rehabilitation

## 2018-10-24 DIAGNOSIS — M5412 Radiculopathy, cervical region: Secondary | ICD-10-CM

## 2018-11-04 ENCOUNTER — Ambulatory Visit
Admission: RE | Admit: 2018-11-04 | Discharge: 2018-11-04 | Disposition: A | Discharge status: Home / Self Care | Payer: Medicare Other | Source: Ambulatory Visit | Attending: Physical Medicine and Rehabilitation | Admitting: Physical Medicine and Rehabilitation

## 2018-11-04 DIAGNOSIS — M5412 Radiculopathy, cervical region: Secondary | ICD-10-CM | POA: Insufficient documentation

## 2019-04-01 ENCOUNTER — Other Ambulatory Visit: Payer: Self-pay | Admitting: Gastroenterology

## 2019-04-01 DIAGNOSIS — R935 Abnormal findings on diagnostic imaging of other abdominal regions, including retroperitoneum: Secondary | ICD-10-CM

## 2019-04-01 DIAGNOSIS — Z85038 Personal history of other malignant neoplasm of large intestine: Secondary | ICD-10-CM

## 2019-04-01 DIAGNOSIS — R194 Change in bowel habit: Secondary | ICD-10-CM

## 2019-04-02 ENCOUNTER — Ambulatory Visit
Admission: RE | Admit: 2019-04-02 | Discharge: 2019-04-02 | Disposition: A | Discharge status: Home / Self Care | Payer: Medicare Other | Source: Ambulatory Visit | Attending: Gastroenterology | Admitting: Gastroenterology

## 2019-04-02 ENCOUNTER — Encounter (INDEPENDENT_AMBULATORY_CARE_PROVIDER_SITE_OTHER): Payer: Self-pay

## 2019-04-02 ENCOUNTER — Other Ambulatory Visit: Payer: Self-pay

## 2019-04-02 DIAGNOSIS — R194 Change in bowel habit: Secondary | ICD-10-CM | POA: Insufficient documentation

## 2019-04-02 DIAGNOSIS — R935 Abnormal findings on diagnostic imaging of other abdominal regions, including retroperitoneum: Secondary | ICD-10-CM | POA: Insufficient documentation

## 2019-04-02 DIAGNOSIS — Z85038 Personal history of other malignant neoplasm of large intestine: Secondary | ICD-10-CM | POA: Insufficient documentation

## 2019-04-02 MED ORDER — IOHEXOL 300 MG/ML  SOLN
100.0000 mL | Freq: Once | INTRAMUSCULAR | Status: AC | PRN
  Administered 2019-04-02: 100 mL via INTRAVENOUS

## 2019-04-03 ENCOUNTER — Inpatient Hospital Stay
Admission: EM | Admit: 2019-04-03 | Discharge: 2019-04-08 | DRG: 392 | Disposition: A | Discharge status: Skilled Nursing Facility | Payer: Medicare Other | Attending: Internal Medicine | Admitting: Internal Medicine

## 2019-04-03 ENCOUNTER — Other Ambulatory Visit: Payer: Self-pay

## 2019-04-03 ENCOUNTER — Encounter: Payer: Self-pay | Admitting: Emergency Medicine

## 2019-04-03 DIAGNOSIS — Z20828 Contact with and (suspected) exposure to other viral communicable diseases: Secondary | ICD-10-CM | POA: Diagnosis present

## 2019-04-03 DIAGNOSIS — Z79899 Other long term (current) drug therapy: Secondary | ICD-10-CM

## 2019-04-03 DIAGNOSIS — Z8673 Personal history of transient ischemic attack (TIA), and cerebral infarction without residual deficits: Secondary | ICD-10-CM | POA: Diagnosis not present

## 2019-04-03 DIAGNOSIS — Z66 Do not resuscitate: Secondary | ICD-10-CM | POA: Diagnosis present

## 2019-04-03 DIAGNOSIS — R140 Abdominal distension (gaseous): Secondary | ICD-10-CM

## 2019-04-03 DIAGNOSIS — K5939 Other megacolon: Secondary | ICD-10-CM | POA: Diagnosis not present

## 2019-04-03 DIAGNOSIS — K219 Gastro-esophageal reflux disease without esophagitis: Secondary | ICD-10-CM | POA: Diagnosis present

## 2019-04-03 DIAGNOSIS — Z85038 Personal history of other malignant neoplasm of large intestine: Secondary | ICD-10-CM

## 2019-04-03 DIAGNOSIS — E785 Hyperlipidemia, unspecified: Secondary | ICD-10-CM | POA: Diagnosis present

## 2019-04-03 DIAGNOSIS — I10 Essential (primary) hypertension: Secondary | ICD-10-CM | POA: Diagnosis present

## 2019-04-03 DIAGNOSIS — R109 Unspecified abdominal pain: Secondary | ICD-10-CM | POA: Diagnosis present

## 2019-04-03 DIAGNOSIS — F17200 Nicotine dependence, unspecified, uncomplicated: Secondary | ICD-10-CM | POA: Diagnosis present

## 2019-04-03 DIAGNOSIS — F329 Major depressive disorder, single episode, unspecified: Secondary | ICD-10-CM | POA: Diagnosis present

## 2019-04-03 DIAGNOSIS — K5981 Ogilvie syndrome: Secondary | ICD-10-CM

## 2019-04-03 DIAGNOSIS — F419 Anxiety disorder, unspecified: Secondary | ICD-10-CM | POA: Diagnosis present

## 2019-04-03 DIAGNOSIS — E860 Dehydration: Secondary | ICD-10-CM

## 2019-04-03 DIAGNOSIS — Z96642 Presence of left artificial hip joint: Secondary | ICD-10-CM | POA: Diagnosis present

## 2019-04-03 DIAGNOSIS — I252 Old myocardial infarction: Secondary | ICD-10-CM | POA: Diagnosis not present

## 2019-04-03 DIAGNOSIS — J449 Chronic obstructive pulmonary disease, unspecified: Secondary | ICD-10-CM | POA: Diagnosis present

## 2019-04-03 DIAGNOSIS — I2510 Atherosclerotic heart disease of native coronary artery without angina pectoris: Secondary | ICD-10-CM | POA: Diagnosis present

## 2019-04-03 DIAGNOSIS — Z7982 Long term (current) use of aspirin: Secondary | ICD-10-CM | POA: Diagnosis not present

## 2019-04-03 DIAGNOSIS — K598 Other specified functional intestinal disorders: Secondary | ICD-10-CM | POA: Diagnosis not present

## 2019-04-03 DIAGNOSIS — Z9049 Acquired absence of other specified parts of digestive tract: Secondary | ICD-10-CM | POA: Diagnosis not present

## 2019-04-03 DIAGNOSIS — K56609 Unspecified intestinal obstruction, unspecified as to partial versus complete obstruction: Secondary | ICD-10-CM

## 2019-04-03 LAB — URINALYSIS, COMPLETE (UACMP) WITH MICROSCOPIC
Bilirubin Urine: NEGATIVE
Glucose, UA: NEGATIVE mg/dL
Hgb urine dipstick: NEGATIVE
Ketones, ur: NEGATIVE mg/dL
Nitrite: NEGATIVE
Protein, ur: NEGATIVE mg/dL
Specific Gravity, Urine: 1.01 (ref 1.005–1.030)
pH: 6 (ref 5.0–8.0)

## 2019-04-03 LAB — GLUCOSE, CAPILLARY
Glucose-Capillary: 87 mg/dL (ref 70–99)
Glucose-Capillary: 56 mg/dL — ABNORMAL LOW (ref 70–99)
Glucose-Capillary: 76 mg/dL (ref 70–99)
Glucose-Capillary: 116 mg/dL — ABNORMAL HIGH (ref 70–99)
Glucose-Capillary: 100 mg/dL — ABNORMAL HIGH (ref 70–99)

## 2019-04-03 LAB — COMPREHENSIVE METABOLIC PANEL
ALT: 23 U/L (ref 0–44)
AST: 23 U/L (ref 15–41)
Albumin: 4.8 g/dL (ref 3.5–5.0)
Alkaline Phosphatase: 70 U/L (ref 38–126)
Anion gap: 8 (ref 5–15)
BUN: 12 mg/dL (ref 8–23)
CO2: 28 mmol/L (ref 22–32)
Calcium: 9.6 mg/dL (ref 8.9–10.3)
Chloride: 102 mmol/L (ref 98–111)
Creatinine, Ser: 1.35 mg/dL — ABNORMAL HIGH (ref 0.61–1.24)
GFR calc Af Amer: >60 mL/min (ref >60)
GFR calc non Af Amer: 53 mL/min — ABNORMAL LOW (ref >60)
Glucose, Bld: 55 mg/dL — ABNORMAL LOW (ref 70–99)
Potassium: 4.3 mmol/L (ref 3.5–5.1)
Sodium: 138 mmol/L (ref 135–145)
Total Bilirubin: 0.5 mg/dL (ref 0.3–1.2)
Total Protein: 8.7 g/dL — ABNORMAL HIGH (ref 6.5–8.1)

## 2019-04-03 LAB — CBC
HCT: 44.4 % (ref 39.0–52.0)
Hemoglobin: 13.9 g/dL (ref 13.0–17.0)
MCH: 30.1 pg (ref 26.0–34.0)
MCHC: 31.3 g/dL (ref 30.0–36.0)
MCV: 96.1 fL (ref 80.0–100.0)
Platelets: 228 10*3/uL (ref 150–400)
RBC: 4.62 MIL/uL (ref 4.22–5.81)
RDW: 12.7 % (ref 11.5–15.5)
WBC: 4.9 10*3/uL (ref 4.0–10.5)
nRBC: 0 % (ref 0.0–0.2)

## 2019-04-03 LAB — TSH: TSH: 0.8 u[IU]/mL (ref 0.350–4.500)

## 2019-04-03 LAB — LACTIC ACID, PLASMA: Lactic Acid, Venous: 1.1 mmol/L (ref 0.5–1.9)

## 2019-04-03 LAB — SARS CORONAVIRUS 2 BY RT PCR (HOSPITAL ORDER, PERFORMED IN ~~LOC~~ HOSPITAL LAB): SARS Coronavirus 2: NEGATIVE

## 2019-04-03 LAB — TROPONIN I (HIGH SENSITIVITY): Troponin I (High Sensitivity): 9 ng/L (ref <18)

## 2019-04-03 MED ORDER — ACETAMINOPHEN 500 MG PO TABS: 500.0000 mg | ORAL_TABLET | Freq: Three times a day (TID) | ORAL | Status: DC

## 2019-04-03 MED ORDER — LISINOPRIL 5 MG PO TABS
7.5000 mg | ORAL_TABLET | Freq: Every day | ORAL | Status: DC
  Administered 2019-04-04 – 2019-04-06 (×3): 7.5 mg via ORAL
  Filled 2019-04-03 (×5): qty 1

## 2019-04-03 MED ORDER — ONDANSETRON HCL 4 MG PO TABS: 4.0000 mg | ORAL_TABLET | Freq: Four times a day (QID) | ORAL | Status: DC | PRN

## 2019-04-03 MED ORDER — HYDROCODONE-ACETAMINOPHEN 5-325 MG PO TABS
1.0000 | ORAL_TABLET | ORAL | Status: DC | PRN
  Administered 2019-04-04: 1 via ORAL
  Administered 2019-04-04 – 2019-04-08 (×5): 2 via ORAL
  Filled 2019-04-03 (×5): qty 2
  Filled 2019-04-03: qty 1

## 2019-04-03 MED ORDER — TIOTROPIUM BROMIDE MONOHYDRATE 18 MCG IN CAPS
18.0000 ug | ORAL_CAPSULE | Freq: Every day | RESPIRATORY_TRACT | Status: DC
  Administered 2019-04-04 – 2019-04-08 (×5): 18 ug via RESPIRATORY_TRACT
  Filled 2019-04-03: qty 5

## 2019-04-03 MED ORDER — NICOTINE POLACRILEX 2 MG MT GUM
2.0000 mg | CHEWING_GUM | OROMUCOSAL | Status: DC | PRN
  Filled 2019-04-03: qty 1

## 2019-04-03 MED ORDER — HYDROCODONE-ACETAMINOPHEN 7.5-325 MG PO TABS: 1.0000 | ORAL_TABLET | Freq: Three times a day (TID) | ORAL | Status: DC | PRN

## 2019-04-03 MED ORDER — ACETAMINOPHEN 650 MG RE SUPP: 650.0000 mg | Freq: Four times a day (QID) | RECTAL | Status: DC | PRN

## 2019-04-03 MED ORDER — NITROGLYCERIN 0.4 MG SL SUBL: 0.4000 mg | SUBLINGUAL_TABLET | SUBLINGUAL | Status: DC | PRN

## 2019-04-03 MED ORDER — NICOTINE 21 MG/24HR TD PT24
21.0000 mg | MEDICATED_PATCH | Freq: Every day | TRANSDERMAL | Status: DC
  Administered 2019-04-03 – 2019-04-08 (×6): 21 mg via TRANSDERMAL
  Filled 2019-04-03 (×6): qty 1

## 2019-04-03 MED ORDER — DEXTROSE 50 % IV SOLN
1.0000 | Freq: Once | INTRAVENOUS | Status: AC
  Administered 2019-04-03: 50 mL via INTRAVENOUS
  Filled 2019-04-03: qty 50

## 2019-04-03 MED ORDER — ONDANSETRON HCL 4 MG/2ML IJ SOLN: 4.0000 mg | Freq: Four times a day (QID) | INTRAMUSCULAR | Status: DC | PRN

## 2019-04-03 MED ORDER — SODIUM CHLORIDE 0.9 % IV BOLUS
1000.0000 mL | Freq: Once | INTRAVENOUS | Status: AC
  Administered 2019-04-03: 1000 mL via INTRAVENOUS

## 2019-04-03 MED ORDER — FUROSEMIDE 20 MG PO TABS
20.0000 mg | ORAL_TABLET | Freq: Every day | ORAL | Status: DC
  Administered 2019-04-04 – 2019-04-08 (×4): 20 mg via ORAL
  Filled 2019-04-03 (×5): qty 1

## 2019-04-03 MED ORDER — HEPARIN SODIUM (PORCINE) 5000 UNIT/ML IJ SOLN
5000.0000 [IU] | Freq: Three times a day (TID) | INTRAMUSCULAR | Status: DC
  Administered 2019-04-03 – 2019-04-08 (×13): 5000 [IU] via SUBCUTANEOUS
  Filled 2019-04-03 (×13): qty 1

## 2019-04-03 MED ORDER — SODIUM CHLORIDE 0.9 % IV SOLN
2.0000 g | Freq: Once | INTRAVENOUS | Status: DC
  Filled 2019-04-03: qty 20

## 2019-04-03 MED ORDER — LEVETIRACETAM 500 MG PO TABS
500.0000 mg | ORAL_TABLET | Freq: Two times a day (BID) | ORAL | Status: DC
  Administered 2019-04-03 – 2019-04-08 (×10): 500 mg via ORAL
  Filled 2019-04-03 (×10): qty 1

## 2019-04-03 MED ORDER — ACETAMINOPHEN 325 MG PO TABS: 650.0000 mg | ORAL_TABLET | Freq: Four times a day (QID) | ORAL | Status: DC | PRN

## 2019-04-03 MED ORDER — GABAPENTIN 400 MG PO CAPS
800.0000 mg | ORAL_CAPSULE | Freq: Three times a day (TID) | ORAL | Status: DC
  Administered 2019-04-03 – 2019-04-08 (×15): 800 mg via ORAL
  Filled 2019-04-03 (×15): qty 2

## 2019-04-03 MED ORDER — LORATADINE 10 MG PO TABS
10.0000 mg | ORAL_TABLET | Freq: Every day | ORAL | Status: DC
  Administered 2019-04-04 – 2019-04-08 (×5): 10 mg via ORAL
  Filled 2019-04-03 (×5): qty 1

## 2019-04-03 MED ORDER — SERTRALINE HCL 50 MG PO TABS
25.0000 mg | ORAL_TABLET | Freq: Every day | ORAL | Status: DC
  Administered 2019-04-04 – 2019-04-08 (×5): 25 mg via ORAL
  Filled 2019-04-03 (×5): qty 1

## 2019-04-03 MED ORDER — PIPERACILLIN-TAZOBACTAM 3.375 G IVPB 30 MIN
3.3750 g | Freq: Once | INTRAVENOUS | Status: AC
  Administered 2019-04-03: 3.375 g via INTRAVENOUS
  Filled 2019-04-03: qty 50

## 2019-04-03 MED ORDER — GUAIFENESIN 100 MG/5ML PO SOLN
5.0000 mL | ORAL | Status: DC | PRN
  Administered 2019-04-03 – 2019-04-08 (×2): 100 mg via ORAL
  Filled 2019-04-03 (×3): qty 5

## 2019-04-03 MED ORDER — TROLAMINE SALICYLATE 10 % EX CREA
TOPICAL_CREAM | Freq: Two times a day (BID) | CUTANEOUS | Status: DC | PRN
  Administered 2019-04-04 – 2019-04-06 (×2): via TOPICAL
  Filled 2019-04-03: qty 85

## 2019-04-03 MED ORDER — ACETAMINOPHEN ER 650 MG PO TBCR: 1300.0000 mg | EXTENDED_RELEASE_TABLET | Freq: Two times a day (BID) | ORAL | Status: DC

## 2019-04-03 MED ORDER — SODIUM CHLORIDE 0.9 % IV SOLN
INTRAVENOUS | Status: DC
  Administered 2019-04-03 – 2019-04-04 (×2): via INTRAVENOUS

## 2019-04-03 MED ORDER — ALPRAZOLAM 0.5 MG PO TABS
0.5000 mg | ORAL_TABLET | Freq: Three times a day (TID) | ORAL | Status: DC
  Administered 2019-04-03 – 2019-04-08 (×14): 0.5 mg via ORAL
  Filled 2019-04-03 (×14): qty 1

## 2019-04-03 MED ORDER — SIMVASTATIN 10 MG PO TABS
10.0000 mg | ORAL_TABLET | Freq: Every day | ORAL | Status: DC
  Administered 2019-04-03 – 2019-04-07 (×5): 10 mg via ORAL
  Filled 2019-04-03 (×7): qty 1

## 2019-04-03 NOTE — Consult Note (Signed)
Calvin Antigua, MD [address] [place], [place], [place], [address] [address], [place], [place], [place], [address] Phone: [phone]  Fax: [phone]  Consultation  Referring Provider:     Dr. Posey Pronto Primary Care Physician:  Lynnell Jude, MD Reason for Consultation:     Abdominal distention  Date of Admission:  04/03/2019 Date of Consultation:  04/03/2019         HPI:   Calvin [name] is a 68 y.o. male evaluated by Dr. Gustavo Lah in clinic due to abdominal distention and change in bowel habits and sent from clinic to the ER for further evaluation due to distention noted on abdominal x-ray.  Patient reports 1 week history of visible abdominal distention.  Reports altered bowel habits where he is having bowel movements every other day but they are loose.  No blood in stool.  Bowel movements are about once every other day.    Reports previous history of colon cancer 20 years ago and states he had surgery for this.  States he was told all the colon cancer was taken out.  We do not have any of these previous records.  Does not think he had any colonoscopy since then.  Past Medical History:  Diagnosis Date  . Alcohol abuse    drinks on weekend  . Anemia   . Anxiety   . Arthritis   . Cancer ([place])    colon,throat  . COPD (chronic obstructive pulmonary disease) ([place])   . Coronary artery disease   . Depression   . Gout   . Hypertension   . Myocardial infarction ([place])   . Neuromuscular disorder ([place])   . Seizures ([place])    last 6 months ago  . Stroke [facility])    multiple  left side weakness  . Tremors of nervous system     Past Surgical History:  Procedure Laterality Date  . CARPAL TUNNEL RELEASE Left 10/19/2015   Procedure: CARPAL TUNNEL RELEASE;  Surgeon: Hessie Knows, MD;  Location: [facility] ORS;  Service: Orthopedics;  Laterality: Left;  . COLON SURGERY    . JOINT REPLACEMENT     left partial hip   . THROAT SURGERY  2013   cancer    Prior to Admission  medications   Medication Sig Start Date End Date Taking? Authorizing Provider  acetaminophen (TYLENOL) 650 MG CR tablet Take 1,300 mg by mouth every 12 (twelve) hours.   Yes [provider]  ALPRAZolam (XANAX) 0.5 MG tablet Take 1 tablet (0.5 mg total) by mouth 3 (three) times daily. 10/02/17  Yes Max Sane, MD  Alum & Mag Hydroxide-Simeth (MYLANTA PO) Take 30 mLs by mouth every 12 (twelve) hours as needed.   Yes [provider]  aspirin 81 MG tablet Take 81 mg by mouth daily.   Yes [provider]  Dextromethorphan-Benzocaine (CEPACOL SORE THROAT & COUGH) 5-7.5 MG LOZG Use as directed 1 lozenge in the mouth or throat every 2 (two) hours as needed (sore throat).   Yes [provider]  Dextromethorphan-guaiFENesin 10-100 MG/5ML liquid Take 5 mLs by mouth every 4 (four) hours as needed.   Yes [provider]  ergocalciferol (VITAMIN D2) 50000 units capsule Take 50,000 Units by mouth every 30 (thirty) days.   Yes [provider]  furosemide (LASIX) 20 MG tablet Take 20 mg by mouth daily.   Yes [provider]  gabapentin (NEURONTIN) 400 MG capsule Take 2 capsules (800 mg total) by mouth 3 (three) times  daily. 10/02/17  Yes Max Sane, MD  HYDROcodone-acetaminophen (NORCO) 7.5-325 MG tablet Take 1 tablet by mouth every 8 (eight) hours as needed for moderate pain.   Yes [provider]  lactulose (CHRONULAC) 10 GM/15ML solution Take 20 g by mouth daily.   Yes [provider]  levETIRAcetam (KEPPRA) 500 MG tablet Take 500 mg by mouth 2 (two) times daily.    Yes [provider]  lisinopril (PRINIVIL,ZESTRIL) 5 MG tablet Take 7.5 mg by mouth daily.   Yes [provider]  loratadine (CLARITIN) 10 MG tablet Take 10 mg by mouth daily.   Yes [provider]  nicotine polacrilex (NICORETTE) 2 MG gum Take 2 mg by mouth every 4 (four) hours as needed for smoking cessation.   Yes [provider]   nitroGLYCERIN (NITROSTAT) 0.4 MG SL tablet Place 0.4 mg under the tongue every 5 (five) minutes as needed for chest pain.   Yes [provider]  nystatin (NYSTATIN) powder Apply topically 2 (two) times daily. Groin rash   Yes [provider]  ondansetron (ZOFRAN) 4 MG tablet Take 4 mg by mouth every 8 (eight) hours as needed for nausea or vomiting.   Yes [provider]  pantoprazole (PROTONIX) 20 MG tablet Take 20 mg by mouth daily.   Yes [provider]  polyethylene glycol (MIRALAX / GLYCOLAX) packet Take 17 g by mouth daily.    Yes [provider]  sertraline (ZOLOFT) 25 MG tablet Take 25 mg by mouth daily.   Yes [provider]  simvastatin (ZOCOR) 10 MG tablet Take 10 mg by mouth at bedtime.    Yes [provider]  tiotropium (SPIRIVA) 18 MCG inhalation capsule Place 18 mcg into inhaler and inhale daily.   Yes [provider]  trolamine salicylate (ASPERCREME) 10 % cream Apply 1 application topically 3 (three) times daily. To neck   Yes [provider]  acetaminophen (TYLENOL) 500 MG tablet Take 500 mg by mouth 3 (three) times daily.    [provider]    Family History  Problem Relation Age of Onset  . Cancer Mother   . Hypertension Father   . Leukemia Brother      Social History   Tobacco Use  . Smoking status: Current Every Day Smoker  . Smokeless tobacco: Never Used  Substance Use Topics  . Alcohol use: Yes    Comment: weekends  . Drug use: No    Allergies as of 04/03/2019  . (No Known Allergies)    Review of Systems:    All systems reviewed and negative except where noted in HPI.   Physical Exam:  Vital signs in last 24 hours: Vitals:   04/03/19 1124 04/03/19 1334 04/03/19 1558 04/03/19 1709  BP:  129/78 139/79 (!) 149/88  Pulse:  60 (!) 56 (!) 59  Resp:  14 15 20   Temp:    97.7 F (36.5 C)  TempSrc:    Oral  SpO2:  98% 100% 100%  Weight: 96.6 kg     Height: 5\' 11"   (1.803 m)        General:   Pleasant, cooperative in NAD Head:  Normocephalic and atraumatic. Eyes:   No icterus.   Conjunctiva pink. PERRLA. Ears:  Normal auditory acuity. Neck:  Supple; no masses or thyroidomegaly Lungs: Respirations even and unlabored. Lungs clear to auscultation bilaterally.   No wheezes, crackles, or rhonchi.  Abdomen:  Soft, distended, nontender. Normal bowel sounds. No appreciable masses or  hepatomegaly.  No rebound or guarding.  Neurologic:  Alert and oriented x3;  grossly normal neurologically. Skin:  Intact without significant lesions or rashes. Cervical Nodes:  No significant cervical adenopathy. Psych:  Alert and cooperative. Normal affect.  LAB RESULTS: Recent Labs    04/03/19 1153  WBC 4.9  HGB 13.9  HCT 44.4  PLT 228   BMET Recent Labs    04/03/19 1153  NA 138  K 4.3  CL 102  CO2 28  GLUCOSE 55*  BUN 12  CREATININE 1.35*  CALCIUM 9.6   LFT Recent Labs    04/03/19 1153  PROT 8.7*  ALBUMIN 4.8  AST 23  ALT 23  ALKPHOS 70  BILITOT 0.5   PT/INR No results for input(s): LABPROT, INR in the last 72 hours.  STUDIES: Ct Abdomen Pelvis W Contrast  Result Date: 04/02/2019 CLINICAL DATA:  Diarrhea for several weeks. History of colon cancer. Concern for possible distal colonic obstruction. EXAM: CT ABDOMEN AND PELVIS WITH CONTRAST TECHNIQUE: Multidetector CT imaging of the abdomen and pelvis was performed using the standard protocol following bolus administration of intravenous contrast. CONTRAST:  112mL OMNIPAQUE IOHEXOL 300 MG/ML  SOLN COMPARISON:  Abdominal CTA 09/30/2017. FINDINGS: Lower chest: The visualized lower chest appears stable with mild chronic atelectasis at the right lung base, atherosclerosis of the aorta and coronary arteries. No significant pleural or pericardial effusion. Hepatobiliary: No focal hepatic abnormalities are identified on noncontrast imaging aside from small calcified granulomas. Dependent small gallstones or  sludge again noted in the gallbladder. No gallbladder wall thickening or surrounding fluid may shin. No biliary dilatation. Pancreas: Unremarkable. No pancreatic ductal dilatation or surrounding inflammatory changes. Spleen: Normal in size without focal abnormality. Adrenals/Urinary Tract: There are stable bilateral adrenal nodules, previously characterized as adenomas. Stable renal cortical scarring and lobularity bilaterally. No evidence of renal mass, urinary tract calculus or hydronephrosis. The bladder is partly obscured by artifact from the left total hip arthroplasty, although appears unremarkable. Stomach/Bowel: The stomach, small bowel, appendix and proximal colon appear normal. Contrast has entered the proximal colon. The transverse colon is moderately distended up to 9 cm in diameter. The distal colon is decompressed with a focal transition in colonic caliber at the splenic flexure. No focal mass lesion identified in this area. Stable sigmoid colon anastomosis. Vascular/Lymphatic: There are no enlarged abdominal or pelvic lymph nodes. Stable small lymph nodes in the porta hepatis. Aortic and branch vessel atherosclerosis without acute vascular findings. Reproductive: The prostate gland and seminal vesicles are partly obscured by artifact from the left total hip arthroplasty, but appear unremarkable. Other: Intact anterior abdominal wall. No ascites or peritoneal nodularity. Musculoskeletal: No acute or significant osseous findings. Old rib fractures are noted bilaterally. There is a stable sclerotic lesion within the left aspect of the L4 vertebral body and its posterior elements. Previous left total hip arthroplasty with chronic deformity of the left greater trochanter. IMPRESSION: 1. Dilated transverse colon with focal change in the colonic caliber at the splenic flexure and decompression of the distal colon. No focal mass lesion identified. Findings are nonspecific and could represent a stricture or  occult mucosal lesion. Recommend further evaluation with colonoscopy or barium enema. 2. A sigmoid colon anastomosis appears unchanged. 3. No evidence of metastatic colon cancer or other acute findings. 4. Additional incidental findings are stable, including cholelithiasis, bilateral adrenal adenomas, bilateral renal cortical scarring and Aortic Atherosclerosis (ICD10-I70.0). 5. These results will be called to the ordering clinician or representative by the Radiologist Assistant, and  communication documented in the PACS or zVision Dashboard. Electronically Signed   By: Richardean Sale M.D.   On: 04/02/2019 10:58      Impression / Plan:   Calvin [name] is a 68 y.o. y/o male with altered bowel habits, abdominal distention, dilated colon, previous history of colon surgery for colon cancer 20 years ago  The CT scan does not report a mechanical obstruction but does report a dilated transverse colon with caliber change at the splenic flexure and decompression of the distal colon.  No focal mass lesion is seen.  GI is being consulted due to colon distention  Patient symptoms could be due to a stricture at the previous surgery site versus pseudoobstruction or Ogilvie syndrome.  As stated in up-to-date, ""Colonoscopy should not be used to make the diagnosis of acute intestinal pseudo-obstruction, as insufflation of air may increase the colonic dilatation."  In addition, colonoscopy which uses air insufflation, done in a setting of colon distention can have higher risk of perforation.  Surgery is on board and is questioning if there is a stricture that is leading to his symptoms.  A safer way of evaluating for a stricture at this site would be ago water-based contrast enema.  Which can act as both therapeutic and diagnostic.  There is no volvulus present and therefore no indication for emergent colonoscopy  If  A stricture is identified on enema study, would recommend further evaluation for  surgery  If no stricture present and this is a pseudoobstruction, as per up-to-date, patient's without severe abdominal pain or colon distention less than 12 cm, which the patient meets both these criterias, can be managed conservatively with IV fluids and decompression.  If mechanical obstruction is ruled out with contrast enema, and abdominal distention continues to worsen, can consider neostigmine in consultation with surgery team  Monitor abdominal exam closely If clinical symptoms acutely worsen, patient needs emergent surgical evaluation  Thank you for involving me in the care of this patient.      LOS: 0 days   Virgel Manifold, MD  04/03/2019, 5:29 PM

## 2019-04-03 NOTE — ED Notes (Signed)
Surgery consult at bedside.

## 2019-04-03 NOTE — ED Provider Notes (Signed)
[facility] Emergency Department Provider Note  ____________________________________________   First MD Initiated Contact with Patient 04/03/19 1147     (approximate)  I have reviewed the triage vital signs and the nursing notes.   HISTORY  Chief Complaint Abdominal Pain    HPI Calvin [name] is a 68 y.o. male  With h/o COPD, HTN, CAD s/p MI, colon CA s/p partial colectomy here with abdominal distention, diarrhea, and generalized fatigue.  The patient states that for the last several weeks he has felt generally fatigued.  He has had increasing intermittent abdominal distention is along with loose stools for the last week.  He was just referred to GI and had a CT scan obtained, which is concerning for possible pseudoobstruction.  He has a remote history of colon cancer.  Denies any known current cancer.  He states he has some mild, intermittent, abdominal cramping but this is not persistent.  He has had mild loss of appetite.  He states he feels generally fatigued and weak.  No urinary symptoms.  No other acute complaints.        Past Medical History:  Diagnosis Date  . Alcohol abuse    drinks on weekend  . Anemia   . Anxiety   . Arthritis   . Cancer ([place])    colon,throat  . COPD (chronic obstructive pulmonary disease) ([place])   . Coronary artery disease   . Depression   . Gout   . Hypertension   . Myocardial infarction ([place])   . Neuromuscular disorder ([place])   . Seizures ([place])    last 6 months ago  . Stroke [facility])    multiple  left side weakness  . Tremors of nervous system     Patient Active Problem List   Diagnosis Date Noted  . Left-sided weakness 09/30/2017  . Seizures ([place]) 09/30/2017  . HTN (hypertension) 09/30/2017  . CAD (coronary artery disease) 09/30/2017  . COPD (chronic obstructive pulmonary disease) ([place]) 09/30/2017  . Depression with anxiety 09/30/2017    Past Surgical History:  Procedure Laterality Date  . CARPAL TUNNEL  RELEASE Left 10/19/2015   Procedure: CARPAL TUNNEL RELEASE;  Surgeon: Hessie Knows, MD;  Location: [facility] ORS;  Service: Orthopedics;  Laterality: Left;  . COLON SURGERY    . JOINT REPLACEMENT     left partial hip   . THROAT SURGERY  2013   cancer    Prior to Admission medications   Medication Sig Start Date End Date Taking? Authorizing Provider  acetaminophen (TYLENOL) 650 MG CR tablet Take 1,300 mg by mouth every 12 (twelve) hours.   Yes [provider]  ALPRAZolam (XANAX) 0.5 MG tablet Take 1 tablet (0.5 mg total) by mouth 3 (three) times daily. 10/02/17  Yes Max Sane, MD  Alum & Mag Hydroxide-Simeth (MYLANTA PO) Take 30 mLs by mouth every 12 (twelve) hours as needed.   Yes [provider]  aspirin 81 MG tablet Take 81 mg by mouth daily.   Yes [provider]  Dextromethorphan-Benzocaine (CEPACOL SORE THROAT & COUGH) 5-7.5 MG LOZG Use as directed 1 lozenge in the mouth or throat every 2 (two) hours as needed (sore throat).   Yes [provider]  Dextromethorphan-guaiFENesin 10-100 MG/5ML liquid Take 5 mLs by mouth every 4 (four) hours as needed.   Yes [provider]  ergocalciferol (VITAMIN D2) 50000 units capsule Take 50,000 Units by mouth every 30 (thirty) days.   Yes [provider]  furosemide (LASIX) 20  MG tablet Take 20 mg by mouth daily.   Yes [provider]  gabapentin (NEURONTIN) 400 MG capsule Take 2 capsules (800 mg total) by mouth 3 (three) times daily. 10/02/17  Yes Max Sane, MD  HYDROcodone-acetaminophen (NORCO) 7.5-325 MG tablet Take 1 tablet by mouth every 8 (eight) hours as needed for moderate pain.   Yes [provider]  lactulose (CHRONULAC) 10 GM/15ML solution Take 20 g by mouth daily.   Yes [provider]  levETIRAcetam (KEPPRA) 500 MG tablet Take 500 mg by mouth 2 (two) times daily.    Yes [provider]  lisinopril (PRINIVIL,ZESTRIL) 5 MG tablet Take 7.5 mg by mouth daily.    Yes [provider]  loratadine (CLARITIN) 10 MG tablet Take 10 mg by mouth daily.   Yes [provider]  nicotine polacrilex (NICORETTE) 2 MG gum Take 2 mg by mouth every 4 (four) hours as needed for smoking cessation.   Yes [provider]  nitroGLYCERIN (NITROSTAT) 0.4 MG SL tablet Place 0.4 mg under the tongue every 5 (five) minutes as needed for chest pain.   Yes [provider]  nystatin (NYSTATIN) powder Apply topically 2 (two) times daily. Groin rash   Yes [provider]  ondansetron (ZOFRAN) 4 MG tablet Take 4 mg by mouth every 8 (eight) hours as needed for nausea or vomiting.   Yes [provider]  pantoprazole (PROTONIX) 20 MG tablet Take 20 mg by mouth daily.   Yes [provider]  polyethylene glycol (MIRALAX / GLYCOLAX) packet Take 17 g by mouth daily.    Yes [provider]  sertraline (ZOLOFT) 25 MG tablet Take 25 mg by mouth daily.   Yes [provider]  simvastatin (ZOCOR) 10 MG tablet Take 10 mg by mouth at bedtime.    Yes [provider]  tiotropium (SPIRIVA) 18 MCG inhalation capsule Place 18 mcg into inhaler and inhale daily.   Yes [provider]  trolamine salicylate (ASPERCREME) 10 % cream Apply 1 application topically 3 (three) times daily. To neck   Yes [provider]  acetaminophen (TYLENOL) 500 MG tablet Take 500 mg by mouth 3 (three) times daily.    [provider]    Allergies Patient has no known allergies.  Family History  Problem Relation Age of Onset  . Cancer Mother   . Hypertension Father   . Leukemia Brother     Social History Social History   Tobacco Use  . Smoking status: Current Every Day Smoker  . Smokeless tobacco: Never Used  Substance Use Topics  . Alcohol use: Yes    Comment: weekends  . Drug use: No    Review of Systems  Review of Systems  Constitutional: Positive for fatigue. Negative for chills and fever.  HENT:  Negative for sore throat.   Respiratory: Negative for shortness of breath.   Cardiovascular: Negative for chest pain.  Gastrointestinal: Positive for abdominal distention, abdominal pain, nausea and vomiting.  Genitourinary: Negative for flank pain.  Musculoskeletal: Negative for neck pain.  Skin: Negative for rash and wound.  Allergic/Immunologic: Negative for immunocompromised state.  Neurological: Positive for weakness. Negative for numbness.  Hematological: Does not bruise/bleed easily.  All other systems reviewed and are negative.    ____________________________________________  PHYSICAL EXAM:      VITAL SIGNS: ED Triage Vitals  Enc Vitals Group     BP 04/03/19 1123 (!) 149/86     Pulse Rate 04/03/19 1123 63  Resp 04/03/19 1123 20     Temp 04/03/19 1123 98.5 F (36.9 C)     Temp Source 04/03/19 1123 Oral     SpO2 04/03/19 1121 97 %     Weight 04/03/19 1124 213 lb (96.6 kg)     Height 04/03/19 1124 5\' 11"  (1.803 m)     Head Circumference --      Peak Flow --      Pain Score 04/03/19 1123 0     Pain Loc --      Pain Edu? --      Excl. in [place]? --      Physical Exam Vitals signs and nursing note reviewed.  Constitutional:      General: He is not in acute distress.    Appearance: He is well-developed.  HENT:     Head: Normocephalic and atraumatic.  Eyes:     Conjunctiva/sclera: Conjunctivae normal.  Neck:     Musculoskeletal: Neck supple.  Cardiovascular:     Rate and Rhythm: Normal rate and regular rhythm.     Heart sounds: Normal heart sounds. No murmur. No friction rub.  Pulmonary:     Effort: Pulmonary effort is normal. No respiratory distress.     Breath sounds: Normal breath sounds. No wheezing or rales.  Abdominal:     General: Abdomen is flat. There is distension.     Palpations: Abdomen is soft.     Tenderness: There is abdominal tenderness (Minimal, diffuse).     Comments: Mildly distended, no rebound or guarding.  No fluid wave.  Skin:     General: Skin is warm.     Capillary Refill: Capillary refill takes less than 2 seconds.  Neurological:     Mental Status: He is alert and oriented to person, place, and time.     Motor: No abnormal muscle tone.       ____________________________________________   LABS (all labs ordered are listed, but only abnormal results are displayed)  Labs Reviewed  URINALYSIS, COMPLETE (UACMP) WITH MICROSCOPIC - Abnormal; Notable for the following components:      Result Value   Color, Urine YELLOW (*)    APPearance HAZY (*)    Leukocytes,Ua LARGE (*)    Bacteria, UA MANY (*)    All other components within normal limits  COMPREHENSIVE METABOLIC PANEL - Abnormal; Notable for the following components:   Glucose, Bld 55 (*)    Creatinine, Ser 1.35 (*)    Total Protein 8.7 (*)    GFR calc non Af Amer 53 (*)    All other components within normal limits  GLUCOSE, CAPILLARY - Abnormal; Notable for the following components:   Glucose-Capillary 56 (*)    All other components within normal limits  GLUCOSE, CAPILLARY - Abnormal; Notable for the following components:   Glucose-Capillary 116 (*)    All other components within normal limits  GLUCOSE, CAPILLARY - Abnormal; Notable for the following components:   Glucose-Capillary 100 (*)    All other components within normal limits  GASTROINTESTINAL PANEL BY PCR, STOOL (REPLACES STOOL CULTURE)  C DIFFICILE QUICK SCREEN W PCR REFLEX  URINE CULTURE  SARS CORONAVIRUS 2 (HOSPITAL ORDER, PERFORMED IN [facility])  GLUCOSE, CAPILLARY  CBC  GLUCOSE, CAPILLARY  LACTIC ACID, PLASMA  TROPONIN I (HIGH SENSITIVITY)    ____________________________________________  EKG: Normal sinus rhythm, ventricular rate 60.  PR 284, QRS 84, QTc 408.  No acute ischemic changes. ________________________________________  RADIOLOGY All imaging, including plain films, CT  scans, and ultrasounds, independently reviewed by me, and interpretations confirmed  via formal radiology reads.  ED MD interpretation:   CT reviewed from outpatient, concerning for pseudoobstruction with possible mucosal lesion or stricture  Official radiology report(s): No results found.  ____________________________________________  PROCEDURES   Procedure(s) performed (including Critical Care):  Procedures  ____________________________________________  INITIAL IMPRESSION / MDM / [place] / ED COURSE  As part of my medical decision making, I reviewed the following data within the electronic MEDICAL RECORD NUMBER Notes from prior ED visits and [place] Controlled Substance Database      *SARKIS RHINES was evaluated in Emergency Department on 04/03/2019 for the symptoms described in the history of present illness. He was evaluated in the context of the global COVID-19 pandemic, which necessitated consideration that the patient might be at risk for infection with the SARS-CoV-2 virus that causes COVID-19. Institutional protocols and algorithms that pertain to the evaluation of patients at risk for COVID-19 are in a state of rapid change based on information released by regulatory bodies including the CDC and federal and state organizations. These policies and algorithms were followed during the patient's care in the ED.  Some ED evaluations and interventions may be delayed as a result of limited staffing during the pandemic.*      Medical Decision Making: 68 year old male here with abdominal distention and reported liquid stools.  Suspect this is overflow diarrhea secondary to partial obstruction and Ogilvie's syndrome.  The scans from prior and I have discussed with GI as well as general surgery.  Will admit for further management.  Patient also with a UTI.  Given possible intra-abdominal infection, will treat with Zosyn, but this can likely be narrowed pending response.  ____________________________________________  FINAL CLINICAL IMPRESSION(S) / ED  DIAGNOSES  Final diagnoses:  Ogilvie's syndrome  Dehydration  Diarrhea of presumed infectious origin     MEDICATIONS GIVEN DURING THIS VISIT:  Medications  dextrose 50 % solution 50 mL (50 mLs Intravenous Given 04/03/19 1328)  sodium chloride 0.9 % bolus 1,000 mL (1,000 mLs Intravenous New Bag/Given 04/03/19 1438)  piperacillin-tazobactam (ZOSYN) IVPB 3.375 g (3.375 g Intravenous New Bag/Given 04/03/19 1451)     ED Discharge Orders    None       Note:  This document was prepared using Dragon voice recognition software and may include unintentional dictation errors.   Duffy Bruce, MD 04/03/19 1538

## 2019-04-03 NOTE — ED Notes (Signed)
Attempted to call family, no answer

## 2019-04-03 NOTE — Progress Notes (Addendum)
Advanced care plan.  Purpose of the Encounter: CODE STATUS  Parties in Attendance: Patient herself himself  Patient's Decision Capacity: Intact  Subjective/Patient's story: Calvin [name]  is a 68 y.o. male with a known history of anemia, anxiety, arthritis, coronary artery disease, COPD, depression, gout, coronary artery disease and a history of stroke who currently resides in a rehab facility resenting to the hospital with abdominal pain ongoing for the past 2 weeks.  He describes the pain in the center of the abdomen sharp in nature.  Patient was seen as outpatient for this and had a CT scan of the abdomen which showed possible pseudoobstruction therefore he was referred to emergency room for further evaluation.    Objective/Medical story  I discussed with the patient regard for his desires for cardiac and pulmonary resuscitation  Goals of care determination:   Patient apparently has a DNR  documented CODE STATUS: Keep him as DNR Time spent discussing advanced care planning: 16 minutes

## 2019-04-03 NOTE — ED Notes (Signed)
ED TO INPATIENT HANDOFF REPORT  ED Nurse Name and Phone #: Celise Bazar [phone]  S Name/Age/Gender Nita Sells 68 y.o. male Room/Bed: ED05HA/ED05HA  Code Status   Code Status: Prior  Home/SNF/Other Nursing Home Patient oriented to: self, place, time and situation Is this baseline? Yes   Triage Complete: Triage complete  Chief Complaint abd pain  Triage Note Pt sent from [facility] for possible SBO. Denies fever or abd pain currently. Staff from nursing facility states that pt had diarrhea Tuesday and today. History of colon cancer per nursing staff. No vomiting   Allergies No Known Allergies  Level of Care/Admitting Diagnosis ED Disposition    ED Disposition Condition [facility]: [place] [[id number]]  Level of Care: Med-Surg [16]  Covid Evaluation: Asymptomatic Screening Protocol (No Symptoms)  Diagnosis: Abdominal pain [[id number]]  Admitting Physician: Dustin Flock [[id number]]  Attending Physician: Dustin Flock [[id number]]  Estimated length of stay: past midnight tomorrow  Certification:: I certify this patient will need inpatient services for at least 2 midnights  PT Class (Do Not Modify): Inpatient [101]  PT Acc Code (Do Not Modify): Private [1]       B Medical/Surgery History Past Medical History:  Diagnosis Date  . Alcohol abuse    drinks on weekend  . Anemia   . Anxiety   . Arthritis   . Cancer ([place])    colon,throat  . COPD (chronic obstructive pulmonary disease) ([place])   . Coronary artery disease   . Depression   . Gout   . Hypertension   . Myocardial infarction ([place])   . Neuromuscular disorder ([place])   . Seizures ([place])    last 6 months ago  . Stroke [facility])    multiple  left side weakness  . Tremors of nervous system    Past Surgical History:  Procedure Laterality Date  . CARPAL TUNNEL RELEASE Left 10/19/2015   Procedure: CARPAL TUNNEL RELEASE;  Surgeon: Hessie Knows, MD;  Location: [facility] ORS;  Service:  Orthopedics;  Laterality: Left;  . COLON SURGERY    . JOINT REPLACEMENT     left partial hip   . THROAT SURGERY  2013   cancer     A IV Location/Drains/Wounds Patient Lines/Drains/Airways Status   Active Line/Drains/Airways    Name:   Placement date:   Placement time:   Site:   Days:   Peripheral IV 04/03/19 Right Antecubital   04/03/19    1145    Antecubital   less than 1          Intake/Output Last 24 hours No intake or output data in the 24 hours ending 04/03/19 1608  Labs/Imaging Results for orders placed or performed during the hospital encounter of 04/03/19 (from the past 48 hour(s))  Glucose, capillary     Status: None   Collection Time: 04/03/19 11:43 AM  Result Value Ref Range   Glucose-Capillary 87 70 - 99 mg/dL  CBC     Status: None   Collection Time: 04/03/19 11:53 AM  Result Value Ref Range   WBC 4.9 4.0 - 10.5 K/uL   RBC 4.62 4.22 - 5.81 MIL/uL   Hemoglobin 13.9 13.0 - 17.0 g/dL   HCT 44.4 39.0 - 52.0 %   MCV 96.1 80.0 - 100.0 fL   MCH 30.1 26.0 - 34.0 pg   MCHC 31.3 30.0 - 36.0 g/dL   RDW 12.7 11.5 - 15.5 %   Platelets 228 150 - 400 K/uL  nRBC 0.0 0.0 - 0.2 %    Comment: Performed at [facility], [address] [place], [place] [address]  Comprehensive metabolic panel     Status: Abnormal   Collection Time: 04/03/19 11:53 AM  Result Value Ref Range   Sodium 138 135 - 145 mmol/L   Potassium 4.3 3.5 - 5.1 mmol/L   Chloride 102 98 - 111 mmol/L   CO2 28 22 - 32 mmol/L   Glucose, Bld 55 (L) 70 - 99 mg/dL   BUN 12 8 - 23 mg/dL   Creatinine, Ser 1.35 (H) 0.61 - 1.24 mg/dL   Calcium 9.6 8.9 - 10.3 mg/dL   Total Protein 8.7 (H) 6.5 - 8.1 g/dL   Albumin 4.8 3.5 - 5.0 g/dL   AST 23 15 - 41 U/L   ALT 23 0 - 44 U/L   Alkaline Phosphatase 70 38 - 126 U/L   Total Bilirubin 0.5 0.3 - 1.2 mg/dL   GFR calc non Af Amer 53 (L) >60 mL/min   GFR calc Af Amer >60 >60 mL/min   Anion gap 8 5 - 15    Comment: Performed at [facility], [address] [place], [place] [address]  Urinalysis, Complete w Microscopic     Status: Abnormal   Collection Time: 04/03/19 12:09 PM  Result Value Ref Range   Color, Urine YELLOW (A) YELLOW   APPearance HAZY (A) CLEAR   Specific Gravity, Urine 1.010 1.005 - 1.030   pH 6.0 5.0 - 8.0   Glucose, UA NEGATIVE NEGATIVE mg/dL   Hgb urine dipstick NEGATIVE NEGATIVE   Bilirubin Urine NEGATIVE NEGATIVE   Ketones, ur NEGATIVE NEGATIVE mg/dL   Protein, ur NEGATIVE NEGATIVE mg/dL   Nitrite NEGATIVE NEGATIVE   Leukocytes,Ua LARGE (A) NEGATIVE   RBC / HPF 0-5 0 - 5 RBC/hpf   WBC, UA 21-50 0 - 5 WBC/hpf   Bacteria, UA MANY (A) NONE SEEN   Squamous Epithelial / LPF 0-5 0 - 5    Comment: Performed at [facility], [address] [place], [place] [address]  Glucose, capillary     Status: None   Collection Time: 04/03/19  1:18 PM  Result Value Ref Range   Glucose-Capillary 76 70 - 99 mg/dL  Glucose, capillary     Status: Abnormal   Collection Time: 04/03/19  1:20 PM  Result Value Ref Range   Glucose-Capillary 56 (L) 70 - 99 mg/dL  Troponin I (High Sensitivity)     Status: None   Collection Time: 04/03/19  2:18 PM  Result Value Ref Range   Troponin I (High Sensitivity) 9 <18 ng/L    Comment: (NOTE) Elevated high sensitivity troponin I (hsTnI) values and significant  changes across serial measurements may suggest ACS but many other  chronic and acute conditions are known to elevate hsTnI results.  Refer to the "Links" section for chest pain algorithms and additional  guidance. Performed at [facility], [address] [place], [place] [address]   Lactic acid, plasma     Status: None   Collection Time: 04/03/19  2:18 PM  Result Value Ref Range   Lactic Acid, Venous 1.1 0.5 - 1.9 mmol/L    Comment: Performed at [facility], [address] [place], [place] [address]  Glucose, capillary     Status: Abnormal   Collection Time: 04/03/19  2:18 PM  Result Value Ref  Range   Glucose-Capillary 116 (H) 70 - 99 mg/dL  Glucose, capillary  Status: Abnormal   Collection Time: 04/03/19  3:00 PM  Result Value Ref Range   Glucose-Capillary 100 (H) 70 - 99 mg/dL   Ct Abdomen Pelvis W Contrast  Result Date: 04/02/2019 CLINICAL DATA:  Diarrhea for several weeks. History of colon cancer. Concern for possible distal colonic obstruction. EXAM: CT ABDOMEN AND PELVIS WITH CONTRAST TECHNIQUE: Multidetector CT imaging of the abdomen and pelvis was performed using the standard protocol following bolus administration of intravenous contrast. CONTRAST:  1106mL OMNIPAQUE IOHEXOL 300 MG/ML  SOLN COMPARISON:  Abdominal CTA 09/30/2017. FINDINGS: Lower chest: The visualized lower chest appears stable with mild chronic atelectasis at the right lung base, atherosclerosis of the aorta and coronary arteries. No significant pleural or pericardial effusion. Hepatobiliary: No focal hepatic abnormalities are identified on noncontrast imaging aside from small calcified granulomas. Dependent small gallstones or sludge again noted in the gallbladder. No gallbladder wall thickening or surrounding fluid may shin. No biliary dilatation. Pancreas: Unremarkable. No pancreatic ductal dilatation or surrounding inflammatory changes. Spleen: Normal in size without focal abnormality. Adrenals/Urinary Tract: There are stable bilateral adrenal nodules, previously characterized as adenomas. Stable renal cortical scarring and lobularity bilaterally. No evidence of renal mass, urinary tract calculus or hydronephrosis. The bladder is partly obscured by artifact from the left total hip arthroplasty, although appears unremarkable. Stomach/Bowel: The stomach, small bowel, appendix and proximal colon appear normal. Contrast has entered the proximal colon. The transverse colon is moderately distended up to 9 cm in diameter. The distal colon is decompressed with a focal transition in colonic caliber at the splenic flexure. No  focal mass lesion identified in this area. Stable sigmoid colon anastomosis. Vascular/Lymphatic: There are no enlarged abdominal or pelvic lymph nodes. Stable small lymph nodes in the porta hepatis. Aortic and branch vessel atherosclerosis without acute vascular findings. Reproductive: The prostate gland and seminal vesicles are partly obscured by artifact from the left total hip arthroplasty, but appear unremarkable. Other: Intact anterior abdominal wall. No ascites or peritoneal nodularity. Musculoskeletal: No acute or significant osseous findings. Old rib fractures are noted bilaterally. There is a stable sclerotic lesion within the left aspect of the L4 vertebral body and its posterior elements. Previous left total hip arthroplasty with chronic deformity of the left greater trochanter. IMPRESSION: 1. Dilated transverse colon with focal change in the colonic caliber at the splenic flexure and decompression of the distal colon. No focal mass lesion identified. Findings are nonspecific and could represent a stricture or occult mucosal lesion. Recommend further evaluation with colonoscopy or barium enema. 2. A sigmoid colon anastomosis appears unchanged. 3. No evidence of metastatic colon cancer or other acute findings. 4. Additional incidental findings are stable, including cholelithiasis, bilateral adrenal adenomas, bilateral renal cortical scarring and Aortic Atherosclerosis (ICD10-I70.0). 5. These results will be called to the ordering clinician or representative by the Radiologist Assistant, and communication documented in the PACS or zVision Dashboard. Electronically Signed   By: Richardean Sale M.D.   On: 04/02/2019 10:58    Pending Labs Unresulted Labs (From admission, onward)    Start     Ordered   04/03/19 1447  SARS Coronavirus 2 (CEPHEID - Performed in [facility] lab), [place]  (Asymptomatic Patients Labs)  Once,   STAT    Question:  Rule Out  Answer:  Yes   04/03/19 1446   04/03/19  1423  Urine culture  Add-on,   AD    Question:  Patient immune status  Answer:  Normal   04/03/19 1422  04/03/19 1421  Gastrointestinal Panel by PCR , Stool  (Gastrointestinal Panel by PCR, Stool)  Once,   STAT     04/03/19 1420   04/03/19 1421  C difficile quick scan w PCR reflex  (C Difficile quick screen w PCR reflex panel)  Once, for 24 hours,   STAT     04/03/19 1420   Signed and Held  CBC  (heparin)  Once,   R    Comments: Baseline for heparin therapy IF NOT ALREADY DRAWN.  Notify MD if PLT < 100 K.    Signed and Held   Signed and Held  Creatinine, serum  (heparin)  Once,   R    Comments: Baseline for heparin therapy IF NOT ALREADY DRAWN.    Signed and Held   Signed and Held  TSH  Once,   R     Signed and Held   Signed and Held  CBC  Tomorrow morning,   R     Signed and Held   Signed and Held  Basic metabolic panel  Tomorrow morning,   R     Signed and Held          Vitals/Pain Today's Vitals   04/03/19 1123 04/03/19 1124 04/03/19 1334 04/03/19 1558  BP: (!) 149/86  129/78 139/79  Pulse: 63  60 (!) 56  Resp: 20  14 15   Temp: 98.5 F (36.9 C)     TempSrc: Oral     SpO2: 100%  98% 100%  Weight:  96.6 kg    Height:  5\' 11"  (1.803 m)    PainSc: 0-No pain       Isolation Precautions Enteric precautions (UV disinfection)  Medications Medications  nicotine (NICODERM CQ - dosed in mg/24 hours) patch 21 mg (has no administration in time range)  dextrose 50 % solution 50 mL (50 mLs Intravenous Given 04/03/19 1328)  sodium chloride 0.9 % bolus 1,000 mL (0 mLs Intravenous Stopped 04/03/19 1556)  piperacillin-tazobactam (ZOSYN) IVPB 3.375 g (0 g Intravenous Stopped 04/03/19 1525)    Mobility walks with device Low fall risk   Focused Assessments sent for possible SBO. CT shows possible stricture.  surgery consulted.   R Recommendations: See Admitting Provider Note  Report given to:   Additional Notes:

## 2019-04-03 NOTE — ED Notes (Signed)
Pt ambulated to the restroom with minimal assistance to obtain urine sample.

## 2019-04-03 NOTE — ED Notes (Signed)
Recheck blood sugar after labs resulted, Blood glucose appears more accurate with lab values on right hand. Dr. Myrene Buddy notified, orders received.

## 2019-04-03 NOTE — ED Notes (Signed)
Pt seems drowsy. Wakes to voice but c/o fatigue.  Arms remain clammy.  Dr Ellender Hose aware.

## 2019-04-03 NOTE — ED Notes (Signed)
Spoke with lab. One troponin to be added to first set of labs and second set will be drawn now as it is 2 hrs from first set.

## 2019-04-03 NOTE — H&P (Signed)
[facility] [place] NAME: Calvin [name]    MR#:  [id number]  DATE OF BIRTH:  10/05/1950  DATE OF ADMISSION:  04/03/2019  PRIMARY CARE PHYSICIAN: Lynnell Jude, MD   REQUESTING/REFERRING PHYSICIAN: Duffy Bruce, MD  CHIEF COMPLAINT:   Chief Complaint  Patient presents with  . Abdominal Pain    HISTORY OF PRESENT ILLNESS: Calvin [name]  is a 68 y.o. male with a known history of anemia, anxiety, arthritis, coronary artery disease, COPD, depression, gout, coronary artery disease and a history of stroke who currently resides in a rehab facility resenting to the hospital with abdominal pain ongoing for the past 2 weeks.  He describes the pain in the center of the abdomen sharp in nature.  Patient was seen as outpatient for this and had a CT scan of the abdomen which showed possible pseudoobstruction therefore he was referred to emergency room for further evaluation.  Emergency room physician has contacted the GI physician as well as surgery physician.  Patient states that he has not had any nausea but has had some diarrhea.  He also has lack of appetite and has not been eating or drinking much. PAST MEDICAL HISTORY:   Past Medical History:  Diagnosis Date  . Alcohol abuse    drinks on weekend  . Anemia   . Anxiety   . Arthritis   . Cancer ([place])    colon,throat  . COPD (chronic obstructive pulmonary disease) ([place])   . Coronary artery disease   . Depression   . Gout   . Hypertension   . Myocardial infarction ([place])   . Neuromuscular disorder ([place])   . Seizures ([place])    last 6 months ago  . Stroke [facility])    multiple  left side weakness  . Tremors of nervous system     PAST SURGICAL HISTORY:  Past Surgical History:  Procedure Laterality Date  . CARPAL TUNNEL RELEASE Left 10/19/2015   Procedure: CARPAL TUNNEL RELEASE;  Surgeon: Hessie Knows, MD;  Location: [facility] ORS;  Service: Orthopedics;  Laterality: Left;  . COLON SURGERY    . JOINT  REPLACEMENT     left partial hip   . THROAT SURGERY  2013   cancer    SOCIAL HISTORY:  Social History   Tobacco Use  . Smoking status: Current Every Day Smoker  . Smokeless tobacco: Never Used  Substance Use Topics  . Alcohol use: Yes    Comment: weekends    FAMILY HISTORY:  Family History  Problem Relation Age of Onset  . Cancer Mother   . Hypertension Father   . Leukemia Brother     DRUG ALLERGIES: No Known Allergies  REVIEW OF SYSTEMS:   CONSTITUTIONAL: No fever, positive fatigue or positive weakness.  EYES: No blurred or double vision.  EARS, NOSE, AND THROAT: No tinnitus or ear pain.  RESPIRATORY: No cough, shortness of breath, wheezing or hemoptysis.  CARDIOVASCULAR: No chest pain, orthopnea, edema.  GASTROINTESTINAL: No nausea, vomiting, positive diarrhea positive abdominal pain and distention gENITOURINARY: No dysuria, hematuria.  ENDOCRINE: No polyuria, nocturia,  HEMATOLOGY: No anemia, easy bruising or bleeding SKIN: No rash or lesion. MUSCULOSKELETAL: No joint pain or arthritis.   NEUROLOGIC: No tingling, numbness, weakness.  PSYCHIATRY: No anxiety or depression.   MEDICATIONS AT HOME:  Prior to Admission medications   Medication Sig Start Date End Date Taking? Authorizing Provider  acetaminophen (TYLENOL) 650 MG CR tablet Take 1,300 mg by mouth every 12 (  twelve) hours.   Yes [provider]  ALPRAZolam (XANAX) 0.5 MG tablet Take 1 tablet (0.5 mg total) by mouth 3 (three) times daily. 10/02/17  Yes Max Sane, MD  Alum & Mag Hydroxide-Simeth (MYLANTA PO) Take 30 mLs by mouth every 12 (twelve) hours as needed.   Yes [provider]  aspirin 81 MG tablet Take 81 mg by mouth daily.   Yes [provider]  Dextromethorphan-Benzocaine (CEPACOL SORE THROAT & COUGH) 5-7.5 MG LOZG Use as directed 1 lozenge in the mouth or throat every 2 (two) hours as needed (sore throat).   Yes [provider]  Dextromethorphan-guaiFENesin  10-100 MG/5ML liquid Take 5 mLs by mouth every 4 (four) hours as needed.   Yes [provider]  ergocalciferol (VITAMIN D2) 50000 units capsule Take 50,000 Units by mouth every 30 (thirty) days.   Yes [provider]  furosemide (LASIX) 20 MG tablet Take 20 mg by mouth daily.   Yes [provider]  gabapentin (NEURONTIN) 400 MG capsule Take 2 capsules (800 mg total) by mouth 3 (three) times daily. 10/02/17  Yes Max Sane, MD  HYDROcodone-acetaminophen (NORCO) 7.5-325 MG tablet Take 1 tablet by mouth every 8 (eight) hours as needed for moderate pain.   Yes [provider]  lactulose (CHRONULAC) 10 GM/15ML solution Take 20 g by mouth daily.   Yes [provider]  levETIRAcetam (KEPPRA) 500 MG tablet Take 500 mg by mouth 2 (two) times daily.    Yes [provider]  lisinopril (PRINIVIL,ZESTRIL) 5 MG tablet Take 7.5 mg by mouth daily.   Yes [provider]  loratadine (CLARITIN) 10 MG tablet Take 10 mg by mouth daily.   Yes [provider]  nicotine polacrilex (NICORETTE) 2 MG gum Take 2 mg by mouth every 4 (four) hours as needed for smoking cessation.   Yes [provider]  nitroGLYCERIN (NITROSTAT) 0.4 MG SL tablet Place 0.4 mg under the tongue every 5 (five) minutes as needed for chest pain.   Yes [provider]  nystatin (NYSTATIN) powder Apply topically 2 (two) times daily. Groin rash   Yes [provider]  ondansetron (ZOFRAN) 4 MG tablet Take 4 mg by mouth every 8 (eight) hours as needed for nausea or vomiting.   Yes [provider]  pantoprazole (PROTONIX) 20 MG tablet Take 20 mg by mouth daily.   Yes [provider]  polyethylene glycol (MIRALAX / GLYCOLAX) packet Take 17 g by mouth daily.    Yes [provider]  sertraline (ZOLOFT) 25 MG tablet Take 25 mg by mouth daily.   Yes [provider]  simvastatin (ZOCOR) 10 MG tablet Take 10 mg by mouth at bedtime.     Yes [provider]  tiotropium (SPIRIVA) 18 MCG inhalation capsule Place 18 mcg into inhaler and inhale daily.   Yes [provider]  trolamine salicylate (ASPERCREME) 10 % cream Apply 1 application topically 3 (three) times daily. To neck   Yes [provider]  acetaminophen (TYLENOL) 500 MG tablet Take 500 mg by mouth 3 (three) times daily.    [provider]      PHYSICAL EXAMINATION:   VITAL SIGNS: Blood pressure 129/78, pulse 60, temperature 98.5 F (36.9 C), temperature source Oral, resp. rate 14, height 5\' 11"  (1.803 m), weight 96.6 kg, SpO2 98 %.  GENERAL:  68 y.o.-year-old patient lying in the bed with no acute distress.  EYES: Pupils equal, round, reactive to light and accommodation.  No scleral icterus. Extraocular muscles intact.  HEENT: Head atraumatic, normocephalic. Oropharynx and nasopharynx clear.  NECK:  Supple, no jugular venous distention. No thyroid enlargement, no tenderness.  LUNGS: Normal breath sounds bilaterally, no wheezing, rales,rhonchi or crepitation. No use of accessory muscles of respiration.  CARDIOVASCULAR: S1, S2 normal. No murmurs, rubs, or gallops.  ABDOMEN: Soft, positive distention no guarding. Bowel sounds diminished. No organomegaly or mass.  EXTREMITIES: No pedal edema, cyanosis, or clubbing.  NEUROLOGIC: Cranial nerves II through XII are intact. Muscle strength 5/5 in all extremities. Sensation intact. Gait not checked.  PSYCHIATRIC: The patient is alert and oriented x 3.  SKIN: No obvious rash, lesion, or ulcer.   LABORATORY PANEL:   CBC Recent Labs  Lab 04/03/19 1153  WBC 4.9  HGB 13.9  HCT 44.4  PLT 228  MCV 96.1  MCH 30.1  MCHC 31.3  RDW 12.7   ------------------------------------------------------------------------------------------------------------------  Chemistries  Recent Labs  Lab 04/03/19 1153  NA 138  K 4.3  CL 102  CO2 28  GLUCOSE 55*  BUN 12  CREATININE 1.35*  CALCIUM 9.6   AST 23  ALT 23  ALKPHOS 70  BILITOT 0.5   ------------------------------------------------------------------------------------------------------------------ estimated creatinine clearance is 60.3 mL/min (A) (by C-G formula based on SCr of 1.35 mg/dL (H)). ------------------------------------------------------------------------------------------------------------------ No results for input(s): TSH, T4TOTAL, T3FREE, THYROIDAB in the last 72 hours.  Invalid input(s): FREET3   Coagulation profile No results for input(s): INR, PROTIME in the last 168 hours. ------------------------------------------------------------------------------------------------------------------- No results for input(s): DDIMER in the last 72 hours. -------------------------------------------------------------------------------------------------------------------  Cardiac Enzymes No results for input(s): CKMB, TROPONINI, MYOGLOBIN in the last 168 hours.  Invalid input(s): CK ------------------------------------------------------------------------------------------------------------------ Invalid input(s): POCBNP  ---------------------------------------------------------------------------------------------------------------  Urinalysis    Component Value Date/Time   COLORURINE YELLOW (A) 04/03/2019 1209   APPEARANCEUR HAZY (A) 04/03/2019 1209   APPEARANCEUR Clear 08/14/2014 0933   LABSPEC 1.010 04/03/2019 1209   LABSPEC 1.015 08/14/2014 0933   PHURINE 6.0 04/03/2019 1209   GLUCOSEU NEGATIVE 04/03/2019 1209   GLUCOSEU Negative 08/14/2014 0933   HGBUR NEGATIVE 04/03/2019 1209   BILIRUBINUR NEGATIVE 04/03/2019 1209   BILIRUBINUR Negative 08/14/2014 0933   KETONESUR NEGATIVE 04/03/2019 1209   PROTEINUR NEGATIVE 04/03/2019 1209   UROBILINOGEN 0.2 06/12/2010 0103   NITRITE NEGATIVE 04/03/2019 1209   LEUKOCYTESUR LARGE (A) 04/03/2019 1209   LEUKOCYTESUR Negative 08/14/2014 0933     RADIOLOGY: Ct  Abdomen Pelvis W Contrast  Result Date: 04/02/2019 CLINICAL DATA:  Diarrhea for several weeks. History of colon cancer. Concern for possible distal colonic obstruction. EXAM: CT ABDOMEN AND PELVIS WITH CONTRAST TECHNIQUE: Multidetector CT imaging of the abdomen and pelvis was performed using the standard protocol following bolus administration of intravenous contrast. CONTRAST:  149mL OMNIPAQUE IOHEXOL 300 MG/ML  SOLN COMPARISON:  Abdominal CTA 09/30/2017. FINDINGS: Lower chest: The visualized lower chest appears stable with mild chronic atelectasis at the right lung base, atherosclerosis of the aorta and coronary arteries. No significant pleural or pericardial effusion. Hepatobiliary: No focal hepatic abnormalities are identified on noncontrast imaging aside from small calcified granulomas. Dependent small gallstones or sludge again noted in the gallbladder. No gallbladder wall thickening or surrounding fluid may shin. No biliary dilatation. Pancreas: Unremarkable. No pancreatic ductal dilatation or surrounding inflammatory changes. Spleen: Normal in size without focal abnormality. Adrenals/Urinary Tract: There are stable bilateral adrenal nodules, previously characterized as adenomas. Stable renal cortical scarring and lobularity bilaterally. No evidence of renal mass, urinary tract calculus or hydronephrosis. The bladder is partly obscured by artifact  from the left total hip arthroplasty, although appears unremarkable. Stomach/Bowel: The stomach, small bowel, appendix and proximal colon appear normal. Contrast has entered the proximal colon. The transverse colon is moderately distended up to 9 cm in diameter. The distal colon is decompressed with a focal transition in colonic caliber at the splenic flexure. No focal mass lesion identified in this area. Stable sigmoid colon anastomosis. Vascular/Lymphatic: There are no enlarged abdominal or pelvic lymph nodes. Stable small lymph nodes in the porta hepatis.  Aortic and branch vessel atherosclerosis without acute vascular findings. Reproductive: The prostate gland and seminal vesicles are partly obscured by artifact from the left total hip arthroplasty, but appear unremarkable. Other: Intact anterior abdominal wall. No ascites or peritoneal nodularity. Musculoskeletal: No acute or significant osseous findings. Old rib fractures are noted bilaterally. There is a stable sclerotic lesion within the left aspect of the L4 vertebral body and its posterior elements. Previous left total hip arthroplasty with chronic deformity of the left greater trochanter. IMPRESSION: 1. Dilated transverse colon with focal change in the colonic caliber at the splenic flexure and decompression of the distal colon. No focal mass lesion identified. Findings are nonspecific and could represent a stricture or occult mucosal lesion. Recommend further evaluation with colonoscopy or barium enema. 2. A sigmoid colon anastomosis appears unchanged. 3. No evidence of metastatic colon cancer or other acute findings. 4. Additional incidental findings are stable, including cholelithiasis, bilateral adrenal adenomas, bilateral renal cortical scarring and Aortic Atherosclerosis (ICD10-I70.0). 5. These results will be called to the ordering clinician or representative by the Radiologist Assistant, and communication documented in the PACS or zVision Dashboard. Electronically Signed   By: Richardean Sale M.D.   On: 04/02/2019 10:58    EKG: Orders placed or performed during the hospital encounter of 04/03/19  . ED EKG  . ED EKG    IMPRESSION AND PLAN: Patient is 68 year old African-American male presenting with abdominal distention and pain  1.  Abdominal pain and distention possibly due to pseudoobstruction GI has been consulted as well as surgery patient will need likely colonoscopy Keep n.p.o. We will give IV fluids  2.  Previous history of stroke I will hold aspirin for planned  colonoscopy  3.  Essential hypertension continue lisinopril  4.  GERD we will place on IV PPI  5.  Hyperlipidemia continue Zocor  6.  Miscellaneous Lovenox for DVT prophylaxis  7.  Nicotine abuse smoking cessation provided 4 minutes spent strongly recommend he stop smoking nicotine patch will be started  All the records are reviewed and case discussed with ED provider. Management plans discussed with the patient, family and they are in agreement.  CODE STATUS: Code Status History    Date Active Date Inactive Code Status Order ID Comments User Context   09/30/2017 2301 10/02/2017 2039 Full Code [id number]  Lance Coon, MD Inpatient   Advance Care Planning Activity       TOTAL TIME TAKING CARE OF THIS PATIENT: 55 minutes.    Dustin Flock M.D on 04/03/2019 at 3:19 PM  Between 7am to 6pm - Pager - [phone]  After 6pm go to [email or web address] - password [email or web address] [facility]  [phone]  CC: Primary care physician; Lynnell Jude, MD

## 2019-04-03 NOTE — ED Triage Notes (Addendum)
Pt sent from [facility] for possible SBO. Denies fever or abd pain currently. Staff from nursing facility states that pt had diarrhea Tuesday and today. History of colon cancer per nursing staff. No vomiting

## 2019-04-03 NOTE — Consult Note (Addendum)
[facility] SURGICAL CONSULTATION NOTE (initial) - cpt: [id number] (Outpatient/ED)   HISTORY OF PRESENT ILLNESS (HPI):  68 y.o. male presented to [facility] ED today for evaluation of abdominal distension, diarrhea, and fatigue. Patient reports that over the last week he has been experiencing worsening abdominal distension intermittently. He went to the gastroenterologist for this Laurine Blazer, PA-C) and underwent KUB which was concerning for colonic distension. Follow up CT again showed dilation of transverse colon to the splenic flexure and decompression of the distal colon. He endorses associated mild nausea, watery diarrhea, and decreased PO intake over the same period of time. He denied any fever, chills, weight loss, night sweats, SOP, CP, or emesis. He does have a history of colon CA more than 10+ and is s/p partial colectomy. This was reportedly in [place] and he had chemotherapy and radiation although he has trouble recalling the specifics of this. No other previous abdominal surgeries. No history of similar presentations.     Surgery is consulted by emergency medicine physician Dr. Lindell Noe, MD in this context for evaluation and management of colonic distension.   PAST MEDICAL HISTORY (PMH):  Past Medical History:  Diagnosis Date  . Alcohol abuse    drinks on weekend  . Anemia   . Anxiety   . Arthritis   . Cancer ([place])    colon,throat  . COPD (chronic obstructive pulmonary disease) ([place])   . Coronary artery disease   . Depression   . Gout   . Hypertension   . Myocardial infarction ([place])   . Neuromuscular disorder ([place])   . Seizures ([place])    last 6 months ago  . Stroke [facility])    multiple  left side weakness  . Tremors of nervous system      PAST SURGICAL HISTORY ([place]):  Past Surgical History:  Procedure Laterality Date  . CARPAL TUNNEL RELEASE Left 10/19/2015   Procedure: CARPAL TUNNEL RELEASE;  Surgeon: Hessie Knows, MD;  Location: [facility] ORS;  Service:  Orthopedics;  Laterality: Left;  . COLON SURGERY    . JOINT REPLACEMENT     left partial hip   . THROAT SURGERY  2013   cancer     MEDICATIONS:  Prior to Admission medications   Medication Sig Start Date End Date Taking? Authorizing Provider  acetaminophen (TYLENOL) 650 MG CR tablet Take 1,300 mg by mouth every 12 (twelve) hours.   Yes [provider]  ALPRAZolam (XANAX) 0.5 MG tablet Take 1 tablet (0.5 mg total) by mouth 3 (three) times daily. 10/02/17  Yes Max Sane, MD  Alum & Mag Hydroxide-Simeth (MYLANTA PO) Take 30 mLs by mouth every 12 (twelve) hours as needed.   Yes [provider]  aspirin 81 MG tablet Take 81 mg by mouth daily.   Yes [provider]  Dextromethorphan-Benzocaine (CEPACOL SORE THROAT & COUGH) 5-7.5 MG LOZG Use as directed 1 lozenge in the mouth or throat every 2 (two) hours as needed (sore throat).   Yes [provider]  Dextromethorphan-guaiFENesin 10-100 MG/5ML liquid Take 5 mLs by mouth every 4 (four) hours as needed.   Yes [provider]  ergocalciferol (VITAMIN D2) 50000 units capsule Take 50,000 Units by mouth every 30 (thirty) days.   Yes [provider]  furosemide (LASIX) 20 MG tablet Take 20 mg by mouth daily.   Yes [provider]  gabapentin (NEURONTIN) 400 MG capsule Take 2 capsules (800 mg total) by mouth 3 (three) times daily. 10/02/17  Yes Manuella Ghazi, [place],  MD  HYDROcodone-acetaminophen (NORCO) 7.5-325 MG tablet Take 1 tablet by mouth every 8 (eight) hours as needed for moderate pain.   Yes [provider]  lactulose (CHRONULAC) 10 GM/15ML solution Take 20 g by mouth daily.   Yes [provider]  levETIRAcetam (KEPPRA) 500 MG tablet Take 500 mg by mouth 2 (two) times daily.    Yes [provider]  lisinopril (PRINIVIL,ZESTRIL) 5 MG tablet Take 7.5 mg by mouth daily.   Yes [provider]  loratadine (CLARITIN) 10 MG tablet Take 10 mg by mouth daily.   Yes  [provider]  nicotine polacrilex (NICORETTE) 2 MG gum Take 2 mg by mouth every 4 (four) hours as needed for smoking cessation.   Yes [provider]  nitroGLYCERIN (NITROSTAT) 0.4 MG SL tablet Place 0.4 mg under the tongue every 5 (five) minutes as needed for chest pain.   Yes [provider]  nystatin (NYSTATIN) powder Apply topically 2 (two) times daily. Groin rash   Yes [provider]  ondansetron (ZOFRAN) 4 MG tablet Take 4 mg by mouth every 8 (eight) hours as needed for nausea or vomiting.   Yes [provider]  pantoprazole (PROTONIX) 20 MG tablet Take 20 mg by mouth daily.   Yes [provider]  polyethylene glycol (MIRALAX / GLYCOLAX) packet Take 17 g by mouth daily.    Yes [provider]  sertraline (ZOLOFT) 25 MG tablet Take 25 mg by mouth daily.   Yes [provider]  simvastatin (ZOCOR) 10 MG tablet Take 10 mg by mouth at bedtime.    Yes [provider]  tiotropium (SPIRIVA) 18 MCG inhalation capsule Place 18 mcg into inhaler and inhale daily.   Yes [provider]  trolamine salicylate (ASPERCREME) 10 % cream Apply 1 application topically 3 (three) times daily. To neck   Yes [provider]  acetaminophen (TYLENOL) 500 MG tablet Take 500 mg by mouth 3 (three) times daily.    [provider]     ALLERGIES:  No Known Allergies   SOCIAL HISTORY:  Social History   Socioeconomic History  . Marital status: Single    Spouse name: Not on file  . Number of children: Not on file  . Years of education: Not on file  . Highest education level: Not on file  Occupational History  . Not on file  Social Needs  . Financial resource strain: Not on file  . Food insecurity    Worry: Not on file    Inability: Not on file  . Transportation needs    Medical: Not on file    Non-medical: Not on file  Tobacco Use  . Smoking status: Current Every Day Smoker  . Smokeless tobacco:  Never Used  Substance and Sexual Activity  . Alcohol use: Yes    Comment: weekends  . Drug use: No  . Sexual activity: Not on file  Lifestyle  . Physical activity    Days per week: Not on file    Minutes per session: Not on file  . Stress: Not on file  Relationships  . Social Herbalist on phone: Not on file    Gets together: Not on file    Attends religious service: Not on file    Active member of club or organization: Not on file    Attends meetings of clubs or organizations: Not on file    Relationship status: Not on file  . Intimate partner  violence    Fear of current or ex partner: Not on file    Emotionally abused: Not on file    Physically abused: Not on file    Forced sexual activity: Not on file  Other Topics Concern  . Not on file  Social History Narrative  . Not on file     FAMILY HISTORY:  Family History  Problem Relation Age of Onset  . Cancer Mother   . Hypertension Father   . Leukemia Brother       REVIEW OF SYSTEMS:  Review of Systems  Constitutional: Positive for malaise/fatigue. Negative for chills, fever and weight loss.       + Decreased PO intake  HENT: Negative for congestion and sore throat.   Respiratory: Negative for cough and shortness of breath.   Cardiovascular: Negative for chest pain and palpitations.  Gastrointestinal: Positive for abdominal pain, diarrhea and nausea. Negative for blood in stool, constipation and vomiting.       + Distension  Genitourinary: Negative for dysuria and urgency.  Neurological: Negative for dizziness and headaches.  All other systems reviewed and are negative.   VITAL SIGNS:  Temp:  [98.5 F (36.9 C)] 98.5 F (36.9 C) (07/30 1123) Pulse Rate:  [60-63] 60 (07/30 1334) Resp:  [14-20] 14 (07/30 1334) BP: (129-149)/(78-86) 129/78 (07/30 1334) SpO2:  [97 %-100 %] 98 % (07/30 1334) Weight:  [96.6 kg] 96.6 kg (07/30 1124)     Height: 5\' 11"  (180.3 cm) Weight: 96.6 kg BMI (Calculated): 29.72    INTAKE/OUTPUT:  No intake/output data recorded.  PHYSICAL EXAM:  Physical Exam Vitals signs and nursing note reviewed.  Constitutional:      General: He is not in acute distress.    Appearance: He is well-developed. He is obese. He is not ill-appearing.  HENT:     Head: Normocephalic and atraumatic.  Eyes:     General: No scleral icterus.    Extraocular Movements: Extraocular movements intact.  Cardiovascular:     Rate and Rhythm: Normal rate and regular rhythm.     Heart sounds: Normal heart sounds. No murmur. No friction rub. No gallop.   Pulmonary:     Effort: Pulmonary effort is normal. No respiratory distress.     Breath sounds: Normal breath sounds. No wheezing or rhonchi.  Abdominal:     General: Abdomen is protuberant. There is distension (Diffuse).     Palpations: Abdomen is soft.     Tenderness: There is generalized abdominal tenderness (Mild).     Hernia: No hernia is present.     Comments: Markedly distended abdomen, tympanic to palpation, mild tenderness, no rebound/guarding, no evidence of peritonitis  Genitourinary:    Comments: Deferred Skin:    General: Skin is warm and dry.     Coloration: Skin is not jaundiced or pale.  Neurological:     General: No focal deficit present.     Mental Status: He is alert and oriented to person, place, and time.  Psychiatric:        Mood and Affect: Mood normal.        Behavior: Behavior normal.      Labs:  CBC Latest Ref Rng & Units 04/03/2019 02/17/2018 10/02/2017  WBC 4.0 - 10.5 K/uL 4.9 3.9 4.6  Hemoglobin 13.0 - 17.0 g/dL 13.9 13.7 14.5  Hematocrit 39.0 - 52.0 % 44.4 40.0 44.5  Platelets 150 - 400 K/uL 228 209 276   CMP Latest Ref Rng & Units 04/03/2019 02/17/2018 10/02/2017  Glucose 70 - 99 mg/dL 55(L) 88 93  BUN 8 - 23 mg/dL 12 15 19   Creatinine 0.61 - 1.24 mg/dL 1.35(H) 1.16 1.38(H)  Sodium 135 - 145 mmol/L 138 137 136  Potassium 3.5 - 5.1 mmol/L 4.3 4.6 4.1  Chloride 98 - 111 mmol/L 102 106 103  CO2 22 -  32 mmol/L 28 23 25   Calcium 8.9 - 10.3 mg/dL 9.6 8.8(L) 9.0  Total Protein 6.5 - 8.1 g/dL 8.7(H) - -  Total Bilirubin 0.3 - 1.2 mg/dL 0.5 - -  Alkaline Phos 38 - 126 U/L 70 - -  AST 15 - 41 U/L 23 - -  ALT 0 - 44 U/L 23 - -     Imaging studies:   CT Abdomen/Pelvis (04/02/2019)( personally reviewed which shows colonic distension to the level of splenic flexure without obvious source, and radiologist report reviewed:  IMPRESSION: 1. Dilated transverse colon with focal change in the colonic caliber at the splenic flexure and decompression of the distal colon. No focal mass lesion identified. Findings are nonspecific and could represent a stricture or occult mucosal lesion. Recommend further evaluation with colonoscopy or barium enema. 2. A sigmoid colon anastomosis appears unchanged. 3. No evidence of metastatic colon cancer or other acute findings. 4. Additional incidental findings are stable, including cholelithiasis, bilateral adrenal adenomas, bilateral renal cortical scarring and Aortic Atherosclerosis (ICD10-I70.0). 5. These results will be called to the ordering clinician or representative by the Radiologist Assistant, and communication documented in the PACS or zVision Dashboard.   Assessment/Plan: (ICD-10's: R14.0) 68 y.o. male with distension of transverse colon to the splenic flexure with decompressed distal colon of unclear etiology, pseudo-obstruction (ex: Ogilvie Syndrome) is entire possible although given his history of colon CA and partial colectomy would like to certain this is not stricture vs intraluminal mass.     - Admit to medicine; appreciate their help; we will follow   - NPO for now + IVF  - Serial abdominal examinations  - Morning KUB to ensure no worsening distension  - No emergent surgical intervention at this time  - Check stool studies, rule out C diff  - Should consider GI consult for possible colonocsopic evaluation for stricture or mass and possible  decompression/placement of decompression tube; although this may be limited given colonic distension and risk of perforation  - Alternatively, IR may place a transanal decompression tube with fluoroscopic guidance  -  A contrast enema is recommended to better delineate the source of obstruction.    - Medical management of comorbidities per primary service  All of the above findings and recommendations were discussed with the patient, and all of patient's questions were answered to his expressed satisfaction.  Thank you for the opportunity to participate in this patient's care.   -- Edison Simon, PA-C [facility] 04/03/2019, 3:41 PM [phone] M-F: 7am - 4pm  I saw and evaluated the patient.  I agree with the above documentation, exam, and plan, which I have edited where appropriate. Fredirick Maudlin  4:49 PM

## 2019-04-04 ENCOUNTER — Inpatient Hospital Stay: Payer: Medicare Other

## 2019-04-04 DIAGNOSIS — R140 Abdominal distension (gaseous): Secondary | ICD-10-CM

## 2019-04-04 LAB — BASIC METABOLIC PANEL
Anion gap: 10 (ref 5–15)
BUN: 10 mg/dL (ref 8–23)
CO2: 22 mmol/L (ref 22–32)
Calcium: 8.7 mg/dL — ABNORMAL LOW (ref 8.9–10.3)
Chloride: 108 mmol/L (ref 98–111)
Creatinine, Ser: 1.2 mg/dL (ref 0.61–1.24)
GFR calc Af Amer: >60 mL/min (ref >60)
GFR calc non Af Amer: >60 mL/min (ref >60)
Glucose, Bld: 85 mg/dL (ref 70–99)
Potassium: 4.3 mmol/L (ref 3.5–5.1)
Sodium: 140 mmol/L (ref 135–145)

## 2019-04-04 LAB — CBC
HCT: 41.4 % (ref 39.0–52.0)
Hemoglobin: 13.2 g/dL (ref 13.0–17.0)
MCH: 30.5 pg (ref 26.0–34.0)
MCHC: 31.9 g/dL (ref 30.0–36.0)
MCV: 95.6 fL (ref 80.0–100.0)
Platelets: 185 10*3/uL (ref 150–400)
RBC: 4.33 MIL/uL (ref 4.22–5.81)
RDW: 12.5 % (ref 11.5–15.5)
WBC: 4 10*3/uL (ref 4.0–10.5)
nRBC: 0 % (ref 0.0–0.2)

## 2019-04-04 LAB — URINE CULTURE
Culture: >=100000 — AB
Special Requests: NORMAL

## 2019-04-04 MED ORDER — IOTHALAMATE MEGLUMINE 17.2 % UR SOLN
1250.0000 mL | Freq: Once | URETHRAL | Status: AC | PRN
  Administered 2019-04-04: 1250 mL

## 2019-04-04 NOTE — Progress Notes (Addendum)
[facility] SUR[facility]ICAL PROGRESS NOTE (cpt [phone])  Hospital Day(s): 1.   Post op day(s):  Marland Kitchen   Interval History: Patient seen and examined, no acute events or new complaints overnight. Patient reports that he is feeling okay this morning. Still with abdominal distension but he denied any fever, chills, abdominal pain, nausea, emesis, or bowel movements/flatus. He has been NPO since admission. Plan this morning for contrast enema to evaluate for mechanical colonic obstruction.   Review of Systems:  Constitutional: denies fever, chills  HEENT: denies cough or congestion  Respiratory: denies any shortness of breath  Cardiovascular: denies chest pain or palpitations  Gastrointestinal: + distension, denies abdominal pain, N/V, or diarrhea/and bowel function as per interval history Genitourinary: denies burning with urination or urinary frequency  Vital signs in last 24 hours: [min-max] current  Temp:  [97.7 F (36.5 C)-98.5 F (36.9 C)] 98 F (36.7 C) (07/31 0550) Pulse Rate:  [56-79] 79 (07/31 0550) Resp:  [14-20] 20 (07/31 0550) BP: (129-149)/(78-88) 129/87 (07/31 0550) SpO2:  [97 %-100 %] 98 % (07/31 0550) Weight:  [96.6 kg] 96.6 kg (07/30 1124)     Height: 5\' 11"  (180.3 cm) Weight: 96.6 kg BMI (Calculated): 29.72   Intake/Output last 2 shifts:  07/30 0701 - 07/31 0700 In: 418.5 [I.V.:418.5] Out: 525 [Urine:525]   Physical Exam:  Constitutional: alert, cooperative and no distress  HENT: normocephalic without obvious abnormality  Eyes: PERRL, EOM's grossly intact and symmetric  Respiratory: breathing non-labored at rest  Gastrointestinal: Markedly distended, tympanic to percussion, non-tender, no rebound/guarding, no overt peritonitis Musculoskeletal: no edema or wounds, motor and sensation grossly intact, NT    Labs:  CBC Latest Ref Rng & Units 04/04/2019 04/03/2019 02/17/2018  WBC 4.0 - 10.5 K/uL 4.0 4.9 3.9  Hemoglobin 13.0 - 17.0 g/dL 13.2 13.9 13.7   Hematocrit 39.0 - 52.0 % 41.4 44.4 40.0  Platelets 150 - 400 K/uL 185 228 209   CMP Latest Ref Rng & Units 04/04/2019 04/03/2019 02/17/2018  Glucose 70 - 99 mg/dL 85 55(L) 88  BUN 8 - 23 mg/dL 10 12 15   Creatinine 0.61 - 1.24 mg/dL 1.20 1.35(H) 1.16  Sodium 135 - 145 mmol/L 140 138 137  Potassium 3.5 - 5.1 mmol/L 4.3 4.3 4.6  Chloride 98 - 111 mmol/L 108 102 106  CO2 22 - 32 mmol/L 22 28 23   Calcium 8.9 - 10.3 mg/dL 8.7(L) 9.6 8.8(L)  Total Protein 6.5 - 8.1 g/dL - 8.7(H) -  Total Bilirubin 0.3 - 1.2 mg/dL - 0.5 -  Alkaline Phos 38 - 126 U/L - 70 -  AST 15 - 41 U/L - 23 -  ALT 0 - 44 U/L - 23 -     Imaging studies:   Contrast enema pending this morning   Assessment/Plan: (ICD-10's: R14.0) 68 y.o. 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     - NPO + IVF  - Follow up results of contrast enema this morning to evaluate for mechanical obstruction  - Serial abdominal examinations   - Check stool studies, rule out C diff   - Appreciate GI input and recommendations   - No emergent indication for surgical intervention; will closely follow  - Further management per primary team   All of the above findings and recommendations were discussed with  the patient, and the medical team, and all of patient's questions were answered to his expressed satisfaction.  -- Edison Simon, PA-C [facility] 04/04/2019, 7:36 AM [phone] M-F: 7am - 4pm  I saw and evaluated the patient.  I agree with the above documentation, exam, and plan, which I have edited where appropriate. Fredirick Maudlin  10:34 AM

## 2019-04-04 NOTE — NC FL2 (Signed)
[place] LEVEL OF CARE SCREENING TOOL     IDENTIFICATION  Patient Name: CAMRYN QUESINBERRY Birthdate: Feb 16, 1951 Sex: male Admission Date (Current Location): 04/03/2019  [place] and [place] Number:  Engineering geologist and Address:  [facility], [address] [place], [place] [address]      Provider Number: [id number]  Attending Physician Name and Address:  Hillary Bow, MD  Relative Name and Phone Number:       Current Level of Care: Hospital Recommended Level of Care: [place] Prior Approval Number:    Date Approved/Denied:   PASRR Number: [id number]  Discharge Plan: SNF    Current Diagnoses: Patient Active Problem List   Diagnosis Date Noted  . Abdominal pain 04/03/2019  . Left-sided weakness 09/30/2017  . Seizures ([place]) 09/30/2017  . HTN (hypertension) 09/30/2017  . CAD (coronary artery disease) 09/30/2017  . COPD (chronic obstructive pulmonary disease) ([place]) 09/30/2017  . Depression with anxiety 09/30/2017    Orientation RESPIRATION BLADDER Height & Weight     Self, Time, Situation, Place  Normal Continent Weight: 213 lb (96.6 kg) Height:  5\' 11"  (180.3 cm)  BEHAVIORAL SYMPTOMS/MOOD NEUROLOGICAL BOWEL NUTRITION STATUS  (None) Convulsions/Seizures(Last 6 months ago per H&P) Continent Diet(See discharge summary when available. Currently NPO.)  AMBULATORY STATUS COMMUNICATION OF NEEDS Skin     Verbally Normal                       Personal Care Assistance Level of Assistance              Functional Limitations Info  Sight, Hearing, Speech Sight Info: Adequate Hearing Info: Adequate Speech Info: Adequate    SPECIAL CARE FACTORS FREQUENCY                       Contractures Contractures Info: Not present    Additional Factors Info  Code Status, Allergies Code Status Info: Full code Allergies Info: NKDA           Current Medications (04/04/2019):  This is the current  hospital active medication list Current Facility-Administered Medications  Medication Dose Route Frequency Provider Last Rate Last Dose  . 0.9 %  sodium chloride infusion   Intravenous Continuous Dustin Flock, MD 50 mL/hr at 04/04/19 0404    . acetaminophen (TYLENOL) tablet 650 mg  650 mg Oral Q6H PRN Dustin Flock, MD       Or  . acetaminophen (TYLENOL) suppository 650 mg  650 mg Rectal Q6H PRN Dustin Flock, MD      . ALPRAZolam Duanne Moron) tablet 0.5 mg  0.5 mg Oral TID Dustin Flock, MD   0.5 mg at 04/04/19 1032  . furosemide (LASIX) tablet 20 mg  20 mg Oral Daily Dustin Flock, MD   20 mg at 04/04/19 1032  . gabapentin (NEURONTIN) capsule 800 mg  800 mg Oral TID Dustin Flock, MD   800 mg at 04/04/19 1032  . guaiFENesin (ROBITUSSIN) 100 MG/5ML solution 100 mg  5 mL Oral Q4H PRN Dustin Flock, MD   100 mg at 04/03/19 2324  . heparin injection 5,000 Units  5,000 Units Subcutaneous Q8H Dustin Flock, MD   5,000 Units at 04/03/19 2151  . HYDROcodone-acetaminophen (NORCO/VICODIN) 5-325 MG per tablet 1-2 tablet  1-2 tablet Oral Q4H PRN Dustin Flock, MD   2 tablet at 04/04/19 0531  . levETIRAcetam (KEPPRA) tablet 500 mg  500 mg Oral BID Dustin Flock, MD  500 mg at 04/04/19 1032  . lisinopril (ZESTRIL) tablet 7.5 mg  7.5 mg Oral Daily Dustin Flock, MD   7.5 mg at 04/04/19 1033  . loratadine (CLARITIN) tablet 10 mg  10 mg Oral Daily Dustin Flock, MD   10 mg at 04/04/19 1032  . nicotine (NICODERM CQ - dosed in mg/24 hours) patch 21 mg  21 mg Transdermal Daily Fredirick Maudlin, MD   21 mg at 04/04/19 1031  . nicotine polacrilex (NICORETTE) gum 2 mg  2 mg Oral Q4H PRN Dustin Flock, MD      . nitroGLYCERIN (NITROSTAT) SL tablet 0.4 mg  0.4 mg Sublingual Q5 min PRN Dustin Flock, MD      . ondansetron [facility]) tablet 4 mg  4 mg Oral Q6H PRN Dustin Flock, MD       Or  . ondansetron (ZOFRAN) injection 4 mg  4 mg Intravenous Q6H PRN Dustin Flock, MD      . sertraline  (ZOLOFT) tablet 25 mg  25 mg Oral Daily Dustin Flock, MD   25 mg at 04/04/19 1031  . simvastatin (ZOCOR) tablet 10 mg  10 mg Oral QHS Dustin Flock, MD   10 mg at 04/03/19 2324  . tiotropium (SPIRIVA) inhalation capsule ([facility] use ONLY) 18 mcg  18 mcg Inhalation Daily Dustin Flock, MD   18 mcg at 04/04/19 1033  . trolamine salicylate (ASPERCREME) 10 % cream   Topical BID PRN Seals, Theo Dills, NP         Discharge Medications: Please see discharge summary for a list of discharge medications.  Relevant Imaging Results:  Relevant Lab Results:   Additional Information SS#: [id number]  Candie Chroman, LCSW

## 2019-04-04 NOTE — TOC Initial Note (Addendum)
Transition of Care [facility]) - Initial/Assessment Note    Patient Details  Name: Calvin [name] MRN: [id number] Date of Birth: 1950-10-03  Transition of Care [facility]) CM/SW Contact:    Candie Chroman, LCSW Phone Number: 04/04/2019, 12:47 PM  Clinical Narrative:  CSW met with patient, introduced role, and explained that discharge planning would be discussed. Patient confirmed he is a long-term resident at [facility] and plans to return at discharge. He has lived there 2 1/2 years. Patient is eager to eat something but still NPO. No further concerns. CSW encouraged patient to contact CSW as needed. CSW will continue to follow patient for support and facilitate return to [facility] when stable.    12:59 pm: Per MD, patient will likely return to SNF tomorrow. SNF is aware and agreeable. Patient will NOT need a new COVID test.               Expected Discharge Plan: Skilled Nursing Facility Barriers to Discharge: Continued Medical Work up   Patient Goals and CMS Choice     Choice offered to / list presented to : NA  Expected Discharge Plan and Services Expected Discharge Plan: [place] Choice: [place] Living arrangements for the past 2 months: [place] Expected Discharge Date: 04/05/19                                    Prior Living Arrangements/Services Living arrangements for the past 2 months: [place] Lives with:: Facility Resident Patient language and need for interpreter reviewed:: Yes Do you feel safe going back to the place where you live?: Yes      Need for Family Participation in Patient Care: Yes (Comment) Care giver support system in place?: Yes (comment)   Criminal Activity/Legal Involvement Pertinent to Current Situation/Hospitalization: No - Comment as needed  Activities of Daily Living Home Assistive Devices/Equipment: Gilford Rile (specify type) ADL Screening  (condition at time of admission) Patient's cognitive ability adequate to safely complete daily activities?: Yes Is the patient deaf or have difficulty hearing?: No Does the patient have difficulty seeing, even when wearing glasses/contacts?: No Does the patient have difficulty concentrating, remembering, or making decisions?: No Patient able to express need for assistance with ADLs?: Yes Does the patient have difficulty dressing or bathing?: No Independently performs ADLs?: Yes (appropriate for developmental age) Does the patient have difficulty walking or climbing stairs?: Yes Weakness of Legs: Both Weakness of Arms/Hands: None  Permission Sought/Granted Permission sought to share information with : Facility Art therapist granted to share information with : Yes, Verbal Permission Granted     Permission granted to share info w AGENCY: [facility]        Emotional Assessment Appearance:: Appears stated age Attitude/Demeanor/Rapport: Engaged, Gracious Affect (typically observed): Accepting, Appropriate, Calm, Pleasant Orientation: : Oriented to Self, Oriented to Place, Oriented to  Time, Oriented to Situation Alcohol / Substance Use: Never Used Psych Involvement: No (comment)  Admission diagnosis:  Dehydration [E86.0] 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 Abdominal pain 04/03/2019  . Left-sided weakness 09/30/2017  . Seizures ([place]) 09/30/2017  . HTN (hypertension) 09/30/2017  . CAD (coronary artery disease) 09/30/2017  . COPD (chronic obstructive pulmonary disease) ([place]) 09/30/2017  . Depression with anxiety 09/30/2017   PCP:  Lynnell Jude, MD  Pharmacy:  No Pharmacies Listed    Social Determinants of Health (SDOH) Interventions    Readmission Risk Interventions No flowsheet data found.

## 2019-04-04 NOTE — Progress Notes (Signed)
[facility] NAME: Skylen Spiering    MR#:  [id number]  DATE OF BIRTH:  March 11, 1951  SUBJECTIVE:  CHIEF COMPLAINT:   Chief Complaint  Patient presents with  . Abdominal Pain   No abd pain. No vomiting He feels his belly is less distended today   REVIEW OF SYSTEMS:    Review of Systems  Constitutional: Positive for malaise/fatigue. Negative for chills and fever.  HENT: Negative for sore throat.   Eyes: Negative for blurred vision, double vision and pain.  Respiratory: Negative for cough, hemoptysis, shortness of breath and wheezing.   Cardiovascular: Negative for chest pain, palpitations, orthopnea and leg swelling.  Gastrointestinal: Negative for abdominal pain, constipation, diarrhea, heartburn, nausea and vomiting.  Genitourinary: Negative for dysuria and hematuria.  Musculoskeletal: Negative for back pain and joint pain.  Skin: Negative for rash.  Neurological: Negative for sensory change, speech change, focal weakness and headaches.  Endo/Heme/Allergies: Does not bruise/bleed easily.  Psychiatric/Behavioral: Negative for depression. The patient is not nervous/anxious.     DRUG ALLERGIES:  No Known Allergies  VITALS:  Blood pressure 129/87, pulse 79, temperature 98 F (36.7 C), temperature source Oral, resp. rate 20, height 5\' 11"  (1.803 m), weight 96.6 kg, SpO2 98 %.  PHYSICAL EXAMINATION:   Physical Exam  GENERAL:  68 y.o.-year-old patient lying in the bed with no acute distress.  EYES: Pupils equal, round, reactive to light and accommodation. No scleral icterus. Extraocular muscles intact.  HEENT: Head atraumatic, normocephalic. Oropharynx and nasopharynx clear.  NECK:  Supple, no jugular venous distention. No thyroid enlargement, no tenderness.  LUNGS: Normal breath sounds bilaterally, no wheezing, rales, rhonchi. No use of accessory muscles of respiration.  CARDIOVASCULAR: S1, S2 normal. No murmurs, rubs, or  gallops.  ABDOMEN: Soft, nontender, nondistended. Bowel sounds present. No organomegaly or mass.  EXTREMITIES: No cyanosis, clubbing or edema b/l.    NEUROLOGIC: Cranial nerves II through XII are intact. No focal Motor or sensory deficits b/l.   PSYCHIATRIC: The patient is alert and oriented x 3.  SKIN: No obvious rash, lesion, or ulcer.   LABORATORY PANEL:   CBC Recent Labs  Lab 04/04/19 0628  WBC 4.0  HGB 13.2  HCT 41.4  PLT 185   ------------------------------------------------------------------------------------------------------------------ Chemistries  Recent Labs  Lab 04/03/19 1153 04/04/19 0628  NA 138 140  K 4.3 4.3  CL 102 108  CO2 28 22  GLUCOSE 55* 85  BUN 12 10  CREATININE 1.35* 1.20  CALCIUM 9.6 8.7*  AST 23  --   ALT 23  --   ALKPHOS 70  --   BILITOT 0.5  --    ------------------------------------------------------------------------------------------------------------------  Cardiac Enzymes No results for input(s): TROPONINI in the last 168 hours. ------------------------------------------------------------------------------------------------------------------  RADIOLOGY:  Dg Be (colon)w Single Cm (sol Or Thin Ba)  Result Date: 04/04/2019 CLINICAL DATA:  History of colon cancer, history of colonic obstruction EXAM: SINGLE COLUMN BARIUM ENEMA TECHNIQUE: Initial scout AP supine abdominal image obtained to insure adequate colon cleansing. Barium was introduced into the colon in a retrograde fashion and refluxed from the rectum to the cecum. Spot images of the colon followed by overhead radiographs were obtained. FLUOROSCOPY TIME:  Fluoroscopy Time:  1 minutes Radiation Exposure Index (if provided by the fluoroscopic device): 26.3 mGy Number of Acquired Spot Images: 0 COMPARISON:  None. FINDINGS: KUB: There is gaseous distension of the colon. There is no small bowel dilatation to suggest obstruction. There is no evidence of  pneumoperitoneum, portal venous gas  or pneumatosis. There are no pathologic calcifications along the expected course of the ureters. The osseous structures are unremarkable. BARIUM ENEMA: An enema catheter was inserted and retention balloon distended under fluoroscopy. Water-soluble contrast was introduced with satisfactory demonstration of the splenic flexure, descending colon and rectosigmoid colon. Examination was negative for large filling defects to indicate the presence of a polyp or annular constricting mass lesion. There was no evidence of colonic stricture or obstruction. IMPRESSION: No distal colonic obstruction or annular constricting mass from the splenic flexure to the rectum. Electronically Signed   By: Kathreen Devoid   On: 04/04/2019 10:22     ASSESSMENT AND PLAN:   Patient is 68 year old African-American male presenting with abdominal distention and pain  * Abdominal pain and distention possibly due to pseudoobstruction GI and surgery consulted. Will wait for further input. N.p.o.  * History of CVA. Aspirin held in case patient needs surgery  *  Essential hypertension continue lisinopril  *  GERD we will place on IV PPI  *  Hyperlipidemia continue Zocor  *DVT prophylaxis with Lovenox  All the records are reviewed and case discussed with Care Management/Social Worker Management plans discussed with the patient, family and they are in agreement.  CODE STATUS: FULL CODE  DVT Prophylaxis: SCDs  TOTAL TIME TAKING CARE OF THIS PATIENT: 30 minutes.   POSSIBLE D/C IN 1-2 DAYS, DEPENDING ON CLINICAL CONDITION.  Leia Alf Javaya [place] M.D on 04/04/2019 at 2:31 PM  Between 7am to 6pm - Pager - [phone]  After 6pm go to [email or web address] - password [email or web address] [facility]  Office  [phone]  CC: Primary care physician; Lynnell Jude, MD  Note: This dictation was prepared with Dragon dictation along with smaller phrase technology. Any transcriptional errors that result from this process  are unintentional.

## 2019-04-04 NOTE — Progress Notes (Signed)
Vonda Antigua, MD [address] [place], [place], [place], [address] [address], [place], [place], [place], [address] Phone: [phone]  Fax: [phone]   Subjective:  Patient reports abdomen feels less distended and more soft today.  No nausea or vomiting.  No diarrhea.  No fever or chills  Objective: Exam: Vital signs in last 24 hours: Vitals:   04/03/19 1558 04/03/19 1709 04/03/19 2153 04/04/19 0550  BP: 139/79 (!) 149/88 (!) 145/84 129/87  Pulse: (!) 56 (!) 59 73 79  Resp: 15 20 20 20   Temp:  97.7 F (36.5 C) 97.7 F (36.5 C) 98 F (36.7 C)  TempSrc:  Oral Oral Oral  SpO2: 100% 100% 98% 98%  Weight:      Height:       Weight change:   Intake/Output Summary (Last 24 hours) at 04/04/2019 1548 Last data filed at 04/04/2019 0962 Gross per 24 hour  Intake 418.47 ml  Output 725 ml  Net -306.53 ml    General: No acute distress, AAO x3 Abd: Soft, NT, much less distended today, No HSM Skin: Warm, no rashes Neck: Supple, Trachea midline   Lab Results: Lab Results  Component Value Date   WBC 4.0 04/04/2019   HGB 13.2 04/04/2019   HCT 41.4 04/04/2019   MCV 95.6 04/04/2019   PLT 185 04/04/2019   Micro Results: Recent Results (from the past 240 hour(s))  Urine culture     Status: Abnormal   Collection Time: 04/03/19 12:47 PM   Specimen: Urine, Clean Catch  Result Value Ref Range Status   Specimen Description   Final    URINE, CLEAN CATCH Performed at [facility], [address], [place], [place] [address]    Special Requests   Final    Normal Performed at [facility], [address] [place], [place] [address]    Culture (A)  Final    >=100,000 COLONIES/mL GROUP B STREP(S.AGALACTIAE)ISOLATED TESTING AGAINST S. AGALACTIAE NOT ROUTINELY PERFORMED DUE TO PREDICTABILITY OF AMP/PEN/VAN SUSCEPTIBILITY. Performed at [facility], [place] [address], [place],  [address]    Report Status 04/04/2019 FINAL  Final  SARS  Coronavirus 2 (CEPHEID - Performed in [facility] hospital lab), Hosp Order     Status: None   Collection Time: 04/03/19  2:47 PM   Specimen: Nasopharyngeal Swab  Result Value Ref Range Status   SARS Coronavirus 2 NEGATIVE NEGATIVE Final    Comment: (NOTE) If result is NEGATIVE SARS-CoV-2 target nucleic acids are NOT DETECTED. The SARS-CoV-2 RNA is generally detectable in upper and lower  respiratory specimens during the acute phase of infection. The lowest  concentration of SARS-CoV-2 viral copies this assay can detect is 250  copies / mL. A negative result does not preclude SARS-CoV-2 infection  and should not be used as the sole basis for treatment or other  patient management decisions.  A negative result may occur with  improper specimen collection / handling, submission of specimen other  than nasopharyngeal swab, presence of viral mutation(s) within the  areas targeted by this assay, and inadequate number of viral copies  (<250 copies / mL). A negative result must be combined with clinical  observations, patient history, and epidemiological information. If result is POSITIVE SARS-CoV-2 target nucleic acids are DETECTED. The SARS-CoV-2 RNA is generally detectable in upper and lower  respiratory specimens dur ing the acute phase of infection.  Positive  results are indicative of active infection with SARS-CoV-2.  Clinical  correlation  with patient history and other diagnostic information is  necessary to determine patient infection status.  Positive results do  not rule out bacterial infection or co-infection with other viruses. If result is PRESUMPTIVE POSTIVE SARS-CoV-2 nucleic acids MAY BE PRESENT.   A presumptive positive result was obtained on the submitted specimen  and confirmed on repeat testing.  While 2019 novel coronavirus  (SARS-CoV-2) nucleic acids may be present in the submitted sample  additional confirmatory testing may be necessary for epidemiological  and / or  clinical management purposes  to differentiate between  SARS-CoV-2 and other Sarbecovirus currently known to infect humans.  If clinically indicated additional testing with an alternate test  methodology [phone]) is advised. The SARS-CoV-2 RNA is generally  detectable in upper and lower respiratory sp ecimens during the acute  phase of infection. The expected result is Negative. Fact Sheet for Patients:  StrictlyIdeas.no Fact Sheet for Healthcare Providers: BankingDealers.co.za This test is not yet approved or cleared by the [place] FDA and has been authorized for detection and/or diagnosis of SARS-CoV-2 by FDA under an Emergency Use Authorization (EUA).  This EUA will remain in effect (meaning this test can be used) for the duration of the COVID-19 declaration under Section 564(b)(1) of the Act, 21 U.S.C. section 360bbb-3(b)(1), unless the authorization is terminated or revoked sooner. Performed at [facility], [place], [place] [address]    Studies/Results: Dg Be (colon)w Single Cm (sol Or Thin Ba)  Result Date: 04/04/2019 CLINICAL DATA:  History of colon cancer, history of colonic obstruction EXAM: SINGLE COLUMN BARIUM ENEMA TECHNIQUE: Initial scout AP supine abdominal image obtained to insure adequate colon cleansing. Barium was introduced into the colon in a retrograde fashion and refluxed from the rectum to the cecum. Spot images of the colon followed by overhead radiographs were obtained. FLUOROSCOPY TIME:  Fluoroscopy Time:  1 minutes Radiation Exposure Index (if provided by the fluoroscopic device): 26.3 mGy Number of Acquired Spot Images: 0 COMPARISON:  None. FINDINGS: KUB: There is gaseous distension of the colon. There is no small bowel dilatation to suggest obstruction. There is no evidence of pneumoperitoneum, portal venous gas or pneumatosis. There are no pathologic calcifications along the expected  course of the ureters. The osseous structures are unremarkable. BARIUM ENEMA: An enema catheter was inserted and retention balloon distended under fluoroscopy. Water-soluble contrast was introduced with satisfactory demonstration of the splenic flexure, descending colon and rectosigmoid colon. Examination was negative for large filling defects to indicate the presence of a polyp or annular constricting mass lesion. There was no evidence of colonic stricture or obstruction. IMPRESSION: No distal colonic obstruction or annular constricting mass from the splenic flexure to the rectum. Electronically Signed   By: Kathreen Devoid   On: 04/04/2019 10:22   Medications:  Scheduled Meds: . ALPRAZolam  0.5 mg Oral TID  . furosemide  20 mg Oral Daily  . gabapentin  800 mg Oral TID  . heparin  5,000 Units Subcutaneous Q8H  . levETIRAcetam  500 mg Oral BID  . lisinopril  7.5 mg Oral Daily  . loratadine  10 mg Oral Daily  . nicotine  21 mg Transdermal Daily  . sertraline  25 mg Oral Daily  . simvastatin  10 mg Oral QHS  . tiotropium  18 mcg Inhalation Daily   Continuous Infusions: . 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  Colonoscopy at this time would be high risk due to colon distention at presentation  Pseudoobstruction and Ogilvie syndrome can be treated conservatively (especially in this setting where he  is already improving, colon distension is less than 12 cm and there is no severe abdominal pain or other alarm symptoms).   Recommend labs once every 24 hours and replete electrolytes as needed  Would recommend abdominal x-ray tomorrow morning to reassess abdominal distention compared to on admission  If abdominal distention worsens or pain occurs, patient would need emergent surgical evaluation at that time.  Currently improving.  Neostigmine can be considered if distension worsens or does not improve  Pt encouraged to ambulate as tolerated and change positions in bed.     LOS: 1 day   Vonda Antigua, MD 04/04/2019, 3:48 PM

## 2019-04-05 MED ORDER — ASPIRIN EC 81 MG PO TBEC
81.0000 mg | DELAYED_RELEASE_TABLET | Freq: Every day | ORAL | Status: DC
  Administered 2019-04-05 – 2019-04-08 (×4): 81 mg via ORAL
  Filled 2019-04-05 (×4): qty 1

## 2019-04-05 NOTE — Progress Notes (Signed)
[facility] Hospital Day(s): 2.   Post op day(s):  Marland Kitchen   Interval History: Patient seen and examined, no acute events or new complaints overnight. Patient reports he has not had any worsening abdominal pain.  He reports passing gas.  Denies nausea or vomiting.  Vital signs in last 24 hours: [min-max] current  Temp:  [97.5 F (36.4 C)-98.6 F (37 C)] 97.5 F (36.4 C) (08/01 0535) Pulse Rate:  [68-84] 68 (08/01 0535) Resp:  [16-18] 16 (08/01 0535) BP: (116-138)/(78-83) 138/78 (08/01 0535) SpO2:  [98 %-99 %] 98 % (08/01 0535)     Height: 5\' 11"  (180.3 cm) Weight: 96.6 kg BMI (Calculated): 29.72   Physical Exam:  Constitutional: alert, cooperative and no distress  Respiratory: breathing non-labored at rest  Cardiovascular: regular rate and sinus rhythm  Gastrointestinal: soft, non-tender, and mild-distended.  Labs:  CBC Latest Ref Rng & Units 04/04/2019 04/03/2019 02/17/2018  WBC 4.0 - 10.5 K/uL 4.0 4.9 3.9  Hemoglobin 13.0 - 17.0 g/dL 13.2 13.9 13.7  Hematocrit 39.0 - 52.0 % 41.4 44.4 40.0  Platelets 150 - 400 K/uL 185 228 209   CMP Latest Ref Rng & Units 04/04/2019 04/03/2019 02/17/2018  Glucose 70 - 99 mg/dL 85 55(L) 88  BUN 8 - 23 mg/dL 10 12 15   Creatinine 0.61 - 1.24 mg/dL 1.20 1.35(H) 1.16  Sodium 135 - 145 mmol/L 140 138 137  Potassium 3.5 - 5.1 mmol/L 4.3 4.3 4.6  Chloride 98 - 111 mmol/L 108 102 106  CO2 22 - 32 mmol/L 22 28 23   Calcium 8.9 - 10.3 mg/dL 8.7(L) 9.6 8.8(L)  Total Protein 6.5 - 8.1 g/dL - 8.7(H) -  Total Bilirubin 0.3 - 1.2 mg/dL - 0.5 -  Alkaline Phos 38 - 126 U/L - 70 -  AST 15 - 41 U/L - 23 -  ALT 0 - 44 U/L - 23 -    Imaging studies: No new pertinent imaging studies   Assessment/Plan:  68 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  Patient has history of colon cancer status post partial colectomy.  Barium enema yesterday shows no stricture or sign of obstruction.  Today patient with soft abdomen and nontender.  The abdomen  looks mildly distended but significantly soft.  The patient endorses passing gas and he feels that he wants to do a bowel movement but has been able to.  I do see any contraindication to start clear liquids as per GI recommendations.  I agree with GI to keep electrolytes within normal limits.  I recommend to continue medical management for colonic pseudoobstruction.  Surgery will remain aware in case medical management fails.   Arnold Long, MD

## 2019-04-05 NOTE — TOC Progression Note (Signed)
Transition of Care [facility]) - Progression Note    Patient Details  Name: Calvin [name] MRN: [id number] Date of Birth: 09-Jun-1951  [facility] CM/SW Contact  Candie Chroman, LCSW Phone Number: 04/05/2019, 1:03 PM  Clinical Narrative:  Per MD, patient will likely discharge back to SNF tomorrow. SNF is aware and agreeable.   Expected Discharge Plan: [place] Barriers to Discharge: Continued Medical Work up  Expected Discharge Plan and Services Expected Discharge Plan: [place] Choice: [place] arrangements for the past 2 months: [facility] Expected Discharge Date: 04/05/19                                     Social Determinants of Health (SDOH) Interventions    Readmission Risk Interventions No flowsheet data found.

## 2019-04-05 NOTE — Progress Notes (Signed)
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  Passing gas.   Objective: Vital signs in last 24 hours: Vitals:   04/04/19 0550 04/04/19 1851 04/04/19 2159 04/05/19 0535  BP: 129/87 128/83 116/81 138/78  Pulse: 79 78 84 68  Resp: 20 17 18 16   Temp: 98 F (36.7 C) 98.6 F (37 C) (!) 97.5 F (36.4 C) (!) 97.5 F (36.4 C)  TempSrc: Oral Oral Oral Oral  SpO2: 98% 98% 99% 98%  Weight:      Height:       Weight change:   Intake/Output Summary (Last 24 hours) at 04/05/2019 6384 Last data filed at 04/05/2019 0535 Gross per 24 hour  Intake 1204.05 ml  Output 1120 ml  Net 84.05 ml     Exam: Heart:: Regular rate and rhythm, S1S2 present or without murmur or extra heart sounds Lungs: normal, clear to auscultation and clear to auscultation and percussion Abdomen: soft, nontender, normal bowel sounds   Lab Results: @LABTEST2 @ Micro Results: Recent Results (from the past 240 hour(s))  Urine culture     Status: Abnormal   Collection Time: 04/03/19 12:47 PM   Specimen: Urine, Clean Catch  Result Value Ref Range Status   Specimen Description   Final    URINE, CLEAN CATCH Performed at [facility], [address] [place], [place] [address]    Special Requests   Final    Normal Performed at [facility], [address] [place], [place] [address]    Culture (A)  Final    >=100,000 COLONIES/mL GROUP B STREP(S.AGALACTIAE)ISOLATED TESTING AGAINST S. AGALACTIAE NOT ROUTINELY PERFORMED DUE TO PREDICTABILITY OF AMP/PEN/VAN SUSCEPTIBILITY. Performed at [facility], [place] [address] [place], [place] [address]    Report Status 04/04/2019 FINAL  Final  SARS Coronavirus 2 (CEPHEID - Performed in [place] hospital lab), Hosp Order     Status:  None   Collection Time: 04/03/19  2:47 PM   Specimen: Nasopharyngeal Swab  Result Value Ref Range Status   SARS Coronavirus 2 NEGATIVE NEGATIVE Final    Comment: (NOTE) If result is NEGATIVE SARS-CoV-2 target nucleic acids are NOT DETECTED. The SARS-CoV-2 RNA is generally detectable in upper and lower  respiratory specimens during the acute phase of infection. The lowest  concentration of SARS-CoV-2 viral copies this assay can detect is 250  copies / mL. A negative result does not preclude SARS-CoV-2 infection  and should not be used as the sole basis for treatment or other  patient management decisions.  A negative result may occur with  improper specimen collection / handling, submission of specimen other  than nasopharyngeal swab, presence of viral mutation(s) within the  areas targeted by this assay, and inadequate number of viral copies  (<250 copies / mL). A negative result must be combined with clinical  observations, patient history, and epidemiological information. If result is POSITIVE SARS-CoV-2 target nucleic acids are DETECTED. The SARS-CoV-2 RNA is generally detectable in upper and lower  respiratory specimens dur ing the acute phase of infection.  Positive  results are indicative of active infection with SARS-CoV-2.  Clinical  correlation with patient history and other diagnostic information is  necessary to determine patient infection status.  Positive results do  not rule out bacterial infection or  co-infection with other viruses. If result is PRESUMPTIVE POSTIVE SARS-CoV-2 nucleic acids MAY BE PRESENT.   A presumptive positive result was obtained on the submitted specimen  and confirmed on repeat testing.  While 2019 novel coronavirus  (SARS-CoV-2) nucleic acids may be present in the submitted sample  additional confirmatory testing may be necessary for epidemiological  and / or clinical management purposes  to differentiate between  SARS-CoV-2 and other  Sarbecovirus currently known to infect humans.  If clinically indicated additional testing with an alternate test  methodology [phone]) is advised. The SARS-CoV-2 RNA is generally  detectable in upper and lower respiratory sp ecimens during the acute  phase of infection. The expected result is Negative. Fact Sheet for Patients:  StrictlyIdeas.no Fact Sheet for Healthcare Providers: BankingDealers.co.za This test is not yet approved or cleared by the [place] FDA and has been authorized for detection and/or diagnosis of SARS-CoV-2 by FDA under an Emergency Use Authorization (EUA).  This EUA will remain in effect (meaning this test can be used) for the duration of the COVID-19 declaration under Section 564(b)(1) of the Act, 21 U.S.C. section 360bbb-3(b)(1), unless the authorization is terminated or revoked sooner. Performed at [facility], [place], [place] [address]    Studies/Results: Dg Be (colon)w Single Cm (sol Or Thin Ba)  Result Date: 04/04/2019 CLINICAL DATA:  History of colon cancer, history of colonic obstruction EXAM: SINGLE COLUMN BARIUM ENEMA TECHNIQUE: Initial scout AP supine abdominal image obtained to insure adequate colon cleansing. Barium was introduced into the colon in a retrograde fashion and refluxed from the rectum to the cecum. Spot images of the colon followed by overhead radiographs were obtained. FLUOROSCOPY TIME:  Fluoroscopy Time:  1 minutes Radiation Exposure Index (if provided by the fluoroscopic device): 26.3 mGy Number of Acquired Spot Images: 0 COMPARISON:  None. FINDINGS: KUB: There is gaseous distension of the colon. There is no small bowel dilatation to suggest obstruction. There is no evidence of pneumoperitoneum, portal venous gas or pneumatosis. There are no pathologic calcifications along the expected course of the ureters. The osseous structures are unremarkable. BARIUM ENEMA:  An enema catheter was inserted and retention balloon distended under fluoroscopy. Water-soluble contrast was introduced with satisfactory demonstration of the splenic flexure, descending colon and rectosigmoid colon. Examination was negative for large filling defects to indicate the presence of a polyp or annular constricting mass lesion. There was no evidence of colonic stricture or obstruction. IMPRESSION: No distal colonic obstruction or annular constricting mass from the splenic flexure to the rectum. Electronically Signed   By: Kathreen Devoid   On: 04/04/2019 10:22   Medications: I have reviewed the patient's current medications. Scheduled Meds: . ALPRAZolam  0.5 mg Oral TID  . furosemide  20 mg Oral Daily  . gabapentin  800 mg Oral TID  . heparin  5,000 Units Subcutaneous Q8H  . levETIRAcetam  500 mg Oral BID  . lisinopril  7.5 mg Oral Daily  . loratadine  10 mg Oral Daily  . nicotine  21 mg Transdermal Daily  . sertraline  25 mg Oral Daily  . simvastatin  10 mg Oral QHS  . tiotropium  18 mcg Inhalation Daily   Continuous Infusions: . sodium chloride 50 mL/hr at 04/04/19 1518   PRN Meds:.acetaminophen **OR** acetaminophen, guaiFENesin, HYDROcodone-acetaminophen, nicotine polacrilex, nitroGLYCERIN, ondansetron **OR** ondansetron (ZOFRAN) IV, trolamine salicylate   Assessment: Active Problems:   Abdominal pain He has a history of CA colon and partial colectomy. He is  being followed for Ogilvie syndrome.  Barium enema performed yesterday showed no luminal obstruction.  CT scan of the abdomen performed on 04/02/2019 showed dilated transverse colon with focal change in colon caliber of the splenic flexure and decompression of the distal colon.  No mass lesion identified.  History of the sigmoid colon anastomosis remains unchanged.  This is my first time I am seeing him today his abdomen does not look significantly distended and is pretty soft and nontender.  Plan: 1.  Serial abdominal  exam.  Mobilize and help with passage of gas. 2.  Commence on clears if surgery is okay with it.  If doing well we can plan to advance tomorrow.  Ensure electrolytes are normal, limit use of any opioids. 3.  Outpatient colonoscopy. 4.  Surgery is also following.   LOS: 2 days   Calvin Bellows, MD 04/05/2019, 9:29 AM

## 2019-04-05 NOTE — Progress Notes (Signed)
Dr Vicente Males approved a clear liquid diet and dr Peyton Najjar aggreed

## 2019-04-05 NOTE — Progress Notes (Addendum)
[place] [facility] [place] NAME: Calvin [name]    MR#:  [id number]  DATE OF BIRTH:  05/07/1951  SUBJECTIVE:  CHIEF COMPLAINT:   Chief Complaint  Patient presents with  . Abdominal Pain   No abd pain. No vomiting Feels like his belly is less distended. Hungry Afebrile   REVIEW OF SYSTEMS:    Review of Systems  Constitutional: Positive for malaise/fatigue. Negative for chills and fever.  HENT: Negative for sore throat.   Eyes: Negative for blurred vision, double vision and pain.  Respiratory: Negative for cough, hemoptysis, shortness of breath and wheezing.   Cardiovascular: Negative for chest pain, palpitations, orthopnea and leg swelling.  Gastrointestinal: Negative for abdominal pain, constipation, diarrhea, heartburn, nausea and vomiting.  Genitourinary: Negative for dysuria and hematuria.  Musculoskeletal: Negative for back pain and joint pain.  Skin: Negative for rash.  Neurological: Negative for sensory change, speech change, focal weakness and headaches.  Endo/Heme/Allergies: Does not bruise/bleed easily.  Psychiatric/Behavioral: Negative for depression. The patient is not nervous/anxious.     DRUG ALLERGIES:  No Known Allergies  VITALS:  Blood pressure (!) 151/86, pulse 74, temperature 98.2 F (36.8 C), temperature source Oral, resp. rate 16, height 5\' 11"  (1.803 m), weight 96.6 kg, SpO2 98 %.  PHYSICAL EXAMINATION:   Physical Exam  GENERAL:  68 y.o.-year-old patient lying in the bed with no acute distress.  EYES: Pupils equal, round, reactive to light and accommodation. No scleral icterus. Extraocular muscles intact.  HEENT: Head atraumatic, normocephalic. Oropharynx and nasopharynx clear.  NECK:  Supple, no jugular venous distention. No thyroid enlargement, no tenderness.  LUNGS: Normal breath sounds bilaterally, no wheezing, rales, rhonchi. No use of accessory muscles of respiration.  CARDIOVASCULAR: S1, S2 normal. No  murmurs, rubs, or gallops.  ABDOMEN: Soft, nontender, nondistended. Bowel sounds present. No organomegaly or mass.  EXTREMITIES: No cyanosis, clubbing or edema b/l.    NEUROLOGIC: Cranial nerves II through XII are intact. Left motor strength decreased PSYCHIATRIC: The patient is alert and oriented x 3.  SKIN: No obvious rash, lesion, or ulcer.   LABORATORY PANEL:   CBC Recent Labs  Lab 04/04/19 0628  WBC 4.0  HGB 13.2  HCT 41.4  PLT 185   ------------------------------------------------------------------------------------------------------------------ Chemistries  Recent Labs  Lab 04/03/19 1153 04/04/19 0628  NA 138 140  K 4.3 4.3  CL 102 108  CO2 28 22  GLUCOSE 55* 85  BUN 12 10  CREATININE 1.35* 1.20  CALCIUM 9.6 8.7*  AST 23  --   ALT 23  --   ALKPHOS 70  --   BILITOT 0.5  --    ------------------------------------------------------------------------------------------------------------------  Cardiac Enzymes No results for input(s): TROPONINI in the last 168 hours. ------------------------------------------------------------------------------------------------------------------  RADIOLOGY:  Dg Be (colon)w Single Cm (sol Or Thin Ba)  Result Date: 04/04/2019 CLINICAL DATA:  History of colon cancer, history of colonic obstruction EXAM: SINGLE COLUMN BARIUM ENEMA TECHNIQUE: Initial scout AP supine abdominal image obtained to insure adequate colon cleansing. Barium was introduced into the colon in a retrograde fashion and refluxed from the rectum to the cecum. Spot images of the colon followed by overhead radiographs were obtained. FLUOROSCOPY TIME:  Fluoroscopy Time:  1 minutes Radiation Exposure Index (if provided by the fluoroscopic device): 26.3 mGy Number of Acquired Spot Images: 0 COMPARISON:  None. FINDINGS: KUB: There is gaseous distension of the colon. There is no small bowel dilatation to suggest obstruction. There is no evidence of pneumoperitoneum, portal  venous gas or pneumatosis. There are no pathologic calcifications along the expected course of the ureters. The osseous structures are unremarkable. BARIUM ENEMA: An enema catheter was inserted and retention balloon distended under fluoroscopy. Water-soluble contrast was introduced with satisfactory demonstration of the splenic flexure, descending colon and rectosigmoid colon. Examination was negative for large filling defects to indicate the presence of a polyp or annular constricting mass lesion. There was no evidence of colonic stricture or obstruction. IMPRESSION: No distal colonic obstruction or annular constricting mass from the splenic flexure to the rectum. Electronically Signed   By: Kathreen Devoid   On: 04/04/2019 10:22     ASSESSMENT AND PLAN:   Patient is 68 year old African-American male presenting with abdominal distention and pain  * Abdominal pain and distention possibly due to pseudoobstruction GI and surgery consulted.  Appreciate input Improving symptoms.  Passing gas. Start clear liquid diet.  * History of CVA. Aspirin held in case patient needs surgery. Patient improving.  Will restart today.  *  Essential hypertension continue lisinopril  *  GERD we will place on IV PPI  *  Hyperlipidemia continue Zocor  * DVT prophylaxis with Lovenox  All the records are reviewed and case discussed with Care Management/Social Worker Management plans discussed with the patient, family and they are in agreement.  CODE STATUS: FULL CODE  TOTAL TIME TAKING CARE OF THIS PATIENT: 30 minutes.   POSSIBLE D/C IN 1-2 DAYS, DEPENDING ON CLINICAL CONDITION.  Leia Alf Teodor Prater M.D on 04/05/2019 at 1:19 PM  Between 7am to 6pm - Pager - [phone]  After 6pm go to [email or web address] - password [email or web address] [facility]  Office  [phone]  CC: Primary care physician; Lynnell Jude, MD  Note: This dictation was prepared with Dragon dictation along with smaller phrase  technology. Any transcriptional errors that result from this process are unintentional.

## 2019-04-06 DIAGNOSIS — K5981 Ogilvie syndrome: Secondary | ICD-10-CM

## 2019-04-06 LAB — GASTROINTESTINAL PANEL BY PCR, STOOL (REPLACES STOOL CULTURE)

## 2019-04-06 MED ORDER — SODIUM CHLORIDE 0.9 % IV BOLUS
1000.0000 mL | Freq: Once | INTRAVENOUS | Status: AC
  Administered 2019-04-06: 1000 mL via INTRAVENOUS

## 2019-04-06 MED ORDER — BISACODYL 10 MG RE SUPP
10.0000 mg | Freq: Once | RECTAL | Status: AC
  Administered 2019-04-06: 12:00:00 10 mg via RECTAL
  Filled 2019-04-06: qty 1

## 2019-04-06 NOTE — Progress Notes (Signed)
Calvin [name] , MD [address] [place], [place], [place], [address] [address], [place], [place], [place], [address] Phone: [phone]  Fax: [phone]   Calvin [name] is being followed for Ogilvie's syndrome   Subjective: Doing well , denies any pain, been eating his lunch with no issues.Had a suppository placed, not had a bowel movement nor has passed much gas   Objective: Vital signs in last 24 hours: Vitals:   04/05/19 1019 04/05/19 1147 04/05/19 2115 04/06/19 0424  BP: 134/89 (!) 151/86 105/75 126/79  Pulse: 75 74 83 75  Resp:  16 (!) 2 17  Temp:  98.2 F (36.8 C) 98.3 F (36.8 C) (!) 97.5 F (36.4 C)  TempSrc:  Oral Oral Oral  SpO2:  98% 98% 100%  Weight:      Height:       Weight change:   Intake/Output Summary (Last 24 hours) at 04/06/2019 1031 Last data filed at 04/06/2019 1003 Gross per 24 hour  Intake 2160 ml  Output 1800 ml  Net 360 ml     Exam: Heart:: Regular rate and rhythm, S1S2 present or without murmur or extra heart sounds Lungs: normal, clear to auscultation and clear to auscultation and percussion Abdomen: soft, nontender, normal bowel sounds   Lab Results: @LABTEST2 @ Micro Results: Recent Results (from the past 240 hour(s))  Urine culture     Status: Abnormal   Collection Time: 04/03/19 12:47 PM   Specimen: Urine, Clean Catch  Result Value Ref Range Status   Specimen Description   Final    URINE, CLEAN CATCH Performed at [facility], [address], [place], [place] [address]    Special Requests   Final    Normal Performed at [facility], [address] [place], [place] [address]    Culture (A)  Final    >=100,000 COLONIES/mL GROUP B STREP(S.AGALACTIAE)ISOLATED TESTING AGAINST S. AGALACTIAE NOT ROUTINELY PERFORMED DUE TO PREDICTABILITY OF AMP/PEN/VAN SUSCEPTIBILITY. Performed at [facility], [place] [address], [place], [place] [address]    Report Status 04/04/2019 FINAL  Final  SARS  Coronavirus 2 (CEPHEID - Performed in [place] hospital lab), Hosp Order     Status: None   Collection Time: 04/03/19  2:47 PM   Specimen: Nasopharyngeal Swab  Result Value Ref Range Status   SARS Coronavirus 2 NEGATIVE NEGATIVE Final    Comment: (NOTE) If result is NEGATIVE SARS-CoV-2 target nucleic acids are NOT DETECTED. The SARS-CoV-2 RNA is generally detectable in upper and lower  respiratory specimens during the acute phase of infection. The lowest  concentration of SARS-CoV-2 viral copies this assay can detect is 250  copies / mL. A negative result does not preclude SARS-CoV-2 infection  and should not be used as the sole basis for treatment or other  patient management decisions.  A negative result may occur with  improper specimen collection / handling, submission of specimen other  than nasopharyngeal swab, presence of viral mutation(s) within the  areas targeted by this assay, and inadequate number of viral copies  (<250 copies / mL). A negative result must be combined with clinical  observations, patient history, and epidemiological information. If result is POSITIVE SARS-CoV-2 target nucleic acids are DETECTED. The SARS-CoV-2 RNA is generally detectable in upper and lower  respiratory specimens dur ing the acute phase of infection.  Positive  results are indicative of active infection with SARS-CoV-2.  Clinical  correlation with patient history and other diagnostic information is  necessary to determine patient infection status.  Positive results do  not rule out bacterial infection or co-infection with other viruses. If result is PRESUMPTIVE POSTIVE SARS-CoV-2 nucleic acids MAY BE PRESENT.   A presumptive positive result was obtained on the submitted specimen  and confirmed on repeat testing.  While 2019 novel coronavirus  (SARS-CoV-2) nucleic acids may be present in the submitted sample  additional confirmatory testing may be necessary for epidemiological  and / or  clinical management purposes  to differentiate between  SARS-CoV-2 and other Sarbecovirus currently known to infect humans.  If clinically indicated additional testing with an alternate test  methodology [phone]) is advised. The SARS-CoV-2 RNA is generally  detectable in upper and lower respiratory sp ecimens during the acute  phase of infection. The expected result is Negative. Fact Sheet for Patients:  StrictlyIdeas.no Fact Sheet for Healthcare Providers: BankingDealers.co.za This test is not yet approved or cleared by the [place] FDA and has been authorized for detection and/or diagnosis of SARS-CoV-2 by FDA under an Emergency Use Authorization (EUA).  This EUA will remain in effect (meaning this test can be used) for the duration of the COVID-19 declaration under Section 564(b)(1) of the Act, 21 U.S.C. section 360bbb-3(b)(1), unless the authorization is terminated or revoked sooner. Performed at [facility], [address], [place], [place] [address]    Studies/Results: No results found. Medications: I have reviewed the patient's current medications. Scheduled Meds: . ALPRAZolam  0.5 mg Oral TID  . aspirin EC  81 mg Oral Daily  . furosemide  20 mg Oral Daily  . gabapentin  800 mg Oral TID  . heparin  5,000 Units Subcutaneous Q8H  . levETIRAcetam  500 mg Oral BID  . lisinopril  7.5 mg Oral Daily  . loratadine  10 mg Oral Daily  . nicotine  21 mg Transdermal Daily  . sertraline  25 mg Oral Daily  . simvastatin  10 mg Oral QHS  . tiotropium  18 mcg Inhalation Daily   Continuous Infusions: PRN Meds:.acetaminophen **OR** acetaminophen, guaiFENesin, HYDROcodone-acetaminophen, nicotine polacrilex, nitroGLYCERIN, ondansetron **OR** ondansetron (ZOFRAN) IV, trolamine salicylate   Assessment: Active Problems:   Abdominal pain   Ogilvie's syndrome  He has a history of CA colon and partial colectomy.He is being  followed for Ogilvie syndrome.  Barium enema performed yesterday showed no luminal obstruction.  CT scan of the abdomen performed on 04/02/2019 showed dilated transverse colon with focal change in colon caliber of the splenic flexure and decompression of the distal colon.  No mass lesion identified.  History of the sigmoid colon anastomosis remains unchanged.    Plan: 1.  Serial abdominal exam.  Mobilize and help with passage of gas. 2. Outpatient colonoscopy. 3. Agree with adding miralax, suppository +/- enemas to help with bowel movement  4.  Surgery is also following.     LOS: 3 days   Calvin Bellows, MD 04/06/2019, 10:31 AM

## 2019-04-06 NOTE — Social Work (Signed)
CSW was informed by Dr. Darvin Neighbours that patient will be ready for discharge tomorrow.  [facility] was notified.

## 2019-04-06 NOTE — Progress Notes (Signed)
[facility] [facility] [place] NAME: Calvin [name]    MR#:  [id number]  DATE OF BIRTH:  1951/05/26  SUBJECTIVE:  CHIEF COMPLAINT:   Chief Complaint  Patient presents with  . Abdominal Pain   No abd pain. No vomiting  Passed some gas NO BM yet  On clear liquid diet   REVIEW OF SYSTEMS:    Review of Systems  Constitutional: Positive for malaise/fatigue. Negative for chills and fever.  HENT: Negative for sore throat.   Eyes: Negative for blurred vision, double vision and pain.  Respiratory: Negative for cough, hemoptysis, shortness of breath and wheezing.   Cardiovascular: Negative for chest pain, palpitations, orthopnea and leg swelling.  Gastrointestinal: Negative for abdominal pain, constipation, diarrhea, heartburn, nausea and vomiting.  Genitourinary: Negative for dysuria and hematuria.  Musculoskeletal: Negative for back pain and joint pain.  Skin: Negative for rash.  Neurological: Negative for sensory change, speech change, focal weakness and headaches.  Endo/Heme/Allergies: Does not bruise/bleed easily.  Psychiatric/Behavioral: Negative for depression. The patient is not nervous/anxious.     DRUG ALLERGIES:  No Known Allergies  VITALS:  Blood pressure 126/79, pulse 75, temperature (!) 97.5 F (36.4 C), temperature source Oral, resp. rate 17, height 5\' 11"  (1.803 m), weight 96.6 kg, SpO2 100 %.  PHYSICAL EXAMINATION:   Physical Exam  GENERAL:  68 y.o.-year-old patient lying in the bed with no acute distress.  EYES: Pupils equal, round, reactive to light and accommodation. No scleral icterus. Extraocular muscles intact.  HEENT: Head atraumatic, normocephalic. Oropharynx and nasopharynx clear.  NECK:  Supple, no jugular venous distention. No thyroid enlargement, no tenderness.  LUNGS: Normal breath sounds bilaterally, no wheezing, rales, rhonchi. No use of accessory muscles of respiration.  CARDIOVASCULAR: S1, S2 normal. No  murmurs, rubs, or gallops.  ABDOMEN: Soft, nontender, nondistended. Bowel sounds present. No organomegaly or mass.  EXTREMITIES: No cyanosis, clubbing or edema b/l.    NEUROLOGIC: Cranial nerves II through XII are intact. Left motor strength decreased PSYCHIATRIC: The patient is alert and oriented x 3.  SKIN: No obvious rash, lesion, or ulcer.   LABORATORY PANEL:   CBC Recent Labs  Lab 04/04/19 0628  WBC 4.0  HGB 13.2  HCT 41.4  PLT 185   ------------------------------------------------------------------------------------------------------------------ Chemistries  Recent Labs  Lab 04/03/19 1153 04/04/19 0628  NA 138 140  K 4.3 4.3  CL 102 108  CO2 28 22  GLUCOSE 55* 85  BUN 12 10  CREATININE 1.35* 1.20  CALCIUM 9.6 8.7*  AST 23  --   ALT 23  --   ALKPHOS 70  --   BILITOT 0.5  --    ------------------------------------------------------------------------------------------------------------------  Cardiac Enzymes No results for input(s): TROPONINI in the last 168 hours. ------------------------------------------------------------------------------------------------------------------  RADIOLOGY:  No results found.   ASSESSMENT AND PLAN:   Patient is 68 year old African-American male presenting with abdominal distention and pain  * Abdominal pain and distention possibly due to pseudoobstruction GI and surgery consulted.  Appreciate input Improving symptoms.  Passing gas. ON clear liquid Ordered dulcolax suppository Abd xray in AM. D/C tomorrow if improving  * History of CVA. ON ASA, statin  *  Essential hypertension continue lisinopril  *  GERD we will place on IV PPI  *  Hyperlipidemia continue Zocor  * DVT prophylaxis with Lovenox  All the records are reviewed and case discussed with Care Management/Social Worker Management plans discussed with the patient, family and they are in agreement.  CODE STATUS:  FULL CODE  TOTAL TIME TAKING CARE  OF THIS PATIENT: 30 minutes.   POSSIBLE D/C IN 1-2 DAYS, DEPENDING ON CLINICAL CONDITION.  Leia Alf Clarissia Mckeen M.D on 04/06/2019 at 11:39 AM  Between 7am to 6pm - Pager - [phone]  After 6pm go to [email or web address] - password [email or web address] [facility]  Office  [phone]  CC: Primary care physician; Lynnell Jude, MD  Note: This dictation was prepared with Dragon dictation along with smaller phrase technology. Any transcriptional errors that result from this process are unintentional.

## 2019-04-06 NOTE — Progress Notes (Signed)
04/06/2019  Subjective: No acute events.  Patient denies any flatus today but feels he needs to have a bowel movement.  Denies any nausea but reports some soreness in his left abdomen.  Started on clears yesterday and had 1500 po intake.  Vital signs: Temp:  [97.5 F (36.4 C)-98.3 F (36.8 C)] 97.5 F (36.4 C) (08/02 0424) Pulse Rate:  [74-83] 75 (08/02 0424) Resp:  [2-17] 17 (08/02 0424) BP: (105-151)/(75-89) 126/79 (08/02 0424) SpO2:  [98 %-100 %] 100 % (08/02 0424)   Intake/Output: 08/01 0701 - 08/02 0700 In: 1980 [P.O.:1560; I.V.:420] Out: 1800 [Urine:1800] Last BM Date: 04/03/19  Physical Exam: Constitutional: No acute distress Abdomen:  Soft, mildly distended, with some soreness over left abdomen but no tenderness, non-peritoneal.  Labs:  Recent Labs    04/03/19 1153 04/04/19 0628  WBC 4.9 4.0  HGB 13.9 13.2  HCT 44.4 41.4  PLT 228 185   Recent Labs    04/03/19 1153 04/04/19 0628  NA 138 140  K 4.3 4.3  CL 102 108  CO2 28 22  GLUCOSE 55* 85  BUN 12 10  CREATININE 1.35* 1.20  CALCIUM 9.6 8.7*   No results for input(s): LABPROT, INR in the last 72 hours.  Imaging: No results found.  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  --Given no flatus today, would keep on clear liquids only today.  May benefit from enema or suppository if no BM/flatus by afternoon. --will order KUB for tomorrow AM to check on abdominal distention and colonic distention.   Melvyn Neth, [facility]

## 2019-04-06 NOTE — TOC Transition Note (Signed)
Transition of Care [facility]) - CM/SW Discharge Note   Patient Details  Name: Calvin [name] MRN: [id number] Date of Birth: 1950-10-07  Transition of Care [facility]) CM/SW Contact:  Fredric Mare, LCSW Phone Number: 04/06/2019, 10:25 AM   Clinical Narrative:     CSW contacted [place] to confirm that patient will be able to return today. Patient is able to return.   Bed Number: E8 Number for report: [phone]  Patient's Attending notified.  Patient's RN notified.    Final next level of care: Skilled Nursing Facility Barriers to Discharge: Continued Medical Work up   Patient Goals and CMS Choice     Choice offered to / list presented to : NA  Discharge Placement                Patient to be transferred to facility by: EMS   Patient and family notified of of transfer: 04/06/19  Discharge Plan and Services     Post Acute Care Choice: [place]                               Social Determinants of Health (SDOH) Interventions     Readmission Risk Interventions No flowsheet data found.

## 2019-04-07 ENCOUNTER — Inpatient Hospital Stay: Payer: Medicare Other

## 2019-04-07 DIAGNOSIS — K598 Other specified functional intestinal disorders: Principal | ICD-10-CM

## 2019-04-07 NOTE — Progress Notes (Signed)
[place] [facility] [place] NAME: Calvin [name]    MR#:  [id number]  DATE OF BIRTH:  [birth year]  SUBJECTIVE:  Patient reports having 2 bowel movements yesterday.  Feels that abdomen is less distended.  Denies nausea and is tolerating clear liquid diet.  Denies abdominal pain REVIEW OF SYSTEMS:    Review of Systems  Constitutional: Negative for fever, chills weight loss HENT: Negative for ear pain, nosebleeds, congestion, facial swelling, rhinorrhea, neck pain, neck stiffness and ear discharge.   Respiratory: Negative for cough, shortness of breath, wheezing  Cardiovascular: Negative for chest pain, palpitations and leg swelling.  Gastrointestinal: Negative for heartburn, abdominal pain, vomiting, diarrhea or consitpation Genitourinary: Negative for dysuria, urgency, frequency, hematuria Musculoskeletal: Negative for back pain or joint pain Neurological: Negative for dizziness, seizures, syncope, focal weakness,  numbness and headaches.  Hematological: Does not bruise/bleed easily.  Psychiatric/Behavioral: Negative for hallucinations, confusion, dysphoric mood    Tolerating Diet: yes      DRUG ALLERGIES:  No Known Allergies  VITALS:  Blood pressure 96/66, pulse 69, temperature (!) 97.4 F (36.3 C), temperature source Oral, resp. rate 14, height 5\' 11"  (1.803 m), weight 96.6 kg, SpO2 99 %.  PHYSICAL EXAMINATION:  Constitutional: Appears well-developed and well-nourished. No distress. HENT: Normocephalic. Marland Kitchen Oropharynx is clear and moist.  Eyes: Conjunctivae and EOM are normal. PERRLA, no scleral icterus.  Neck: Normal ROM. Neck supple. No JVD. No tracheal deviation. CVS: RRR, S1/S2 +, no murmurs, no gallops, no carotid bruit.  Pulmonary: Effort and breath sounds normal, no stridor, rhonchi, wheezes, rales.  Abdominal: Soft. BS +, slightly distended without tenderness, rebound or guarding.  Musculoskeletal: Normal range of motion. No edema  and no tenderness.  Neuro: Alert. CN 2-12 grossly intact. No focal deficits. Skin: Skin is warm and dry. No rash noted. Psychiatric: Normal mood and affect.      LABORATORY PANEL:   CBC Recent Labs  Lab 04/04/19 0628  WBC 4.0  HGB 13.2  HCT 41.4  PLT 185   ------------------------------------------------------------------------------------------------------------------  Chemistries  Recent Labs  Lab 04/03/19 1153 04/04/19 0628  NA 138 140  K 4.3 4.3  CL 102 108  CO2 28 22  GLUCOSE 55* 85  BUN 12 10  CREATININE 1.35* 1.20  CALCIUM 9.6 8.7*  AST 23  --   ALT 23  --   ALKPHOS 70  --   BILITOT 0.5  --    ------------------------------------------------------------------------------------------------------------------  Cardiac Enzymes No results for input(s): TROPONINI in the last 168 hours. ------------------------------------------------------------------------------------------------------------------  RADIOLOGY:  Dg Abd 1 View  Result Date: 04/07/2019 CLINICAL DATA:  Abdominal distention. EXAM: ABDOMEN - 1 VIEW COMPARISON:  BE 04/04/2019.  CT 04/02/2019. FINDINGS: Transverse and left colonic distention again noted without interim change. Soft tissues are unremarkable. Vascular calcifications in the pelvis. Degenerative change lumbar spine and right hip. Total left hip replacement. No acute bony abnormality. IMPRESSION: Transverse and left colonic distention again noted without interim change. No small bowel distention. No free air. 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  1.  Pseudoobstruction/Ogilvie syndrome: Patient followed closely by surgery and GI.  Patient will continue on clear liquid diet.  Plan to continue enemas  or neostigmine to alleviate colonic dilation seen  on today's KUB.  Repeat KUB in a.m.    2.  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  3. Tobacco dependence: Patient is encouraged to quit smoking and willing to attempt to quit was assessed. Patient highly motivated.Counseling was provided for 4 minutes.   4.  Depression/anxiety: Continue Zoloft and Ativan  5.  Hyperlipidemia: Continue statin  6.  COPD without signs of exacerbation: Continue Spiriva  7.  Essential hypertension: Blood pressure control.  Continue lisinopril Management plans discussed with the patient and he is in agreement.  CODE STATUS: full  TOTAL TIME TAKING CARE OF THIS PATIENT: 28 minutes.     POSSIBLE D/C tomorrow, DEPENDING ON CLINICAL CONDITION.   Bettey Costa M.D on 04/07/2019 at 11:24 AM  Between 7am to 6pm - Pager - [phone] After 6pm go to [email or web address] - password [email or web address] [facility]  Office  [phone]  CC: Primary care physician; Lynnell Jude, MD  Note: This dictation was prepared with Dragon dictation along with smaller phrase technology. Any transcriptional errors that result from this process are unintentional.

## 2019-04-07 NOTE — Progress Notes (Signed)
Cephas Darby, MD [address]  [place]  [place], [place] [address]  Main: [phone]  Fax: [phone] Pager: [phone]   Subjective: Patient had 2 large episodes of bowel movements yesterday.  None today.  Has been tolerating liquids well   Objective: Vital signs in last 24 hours: Vitals:   04/06/19 1656 04/06/19 2005 04/07/19 0506 04/07/19 0926  BP: 110/69 93/77 96/72  96/66  Pulse: 74 72 63 69  Resp:  16 14   Temp:  97.8 F (36.6 C) (!) 97.4 F (36.3 C)   TempSrc:  Oral Oral   SpO2: 98% 98% 99% 99%  Weight:      Height:       Weight change:   Intake/Output Summary (Last 24 hours) at 04/07/2019 1757 Last data filed at 04/07/2019 1406 Gross per 24 hour  Intake 1560 ml  Output 200 ml  Net 1360 ml     Exam: Heart:: Regular rate and rhythm or S1S2 present Lungs: normal and clear to auscultation Abdomen: soft, nontender, normal bowel sounds, distended, tympanic   Lab Results: CBC Latest Ref Rng & Units 04/04/2019 04/03/2019 02/17/2018  WBC 4.0 - 10.5 K/uL 4.0 4.9 3.9  Hemoglobin 13.0 - 17.0 g/dL 13.2 13.9 13.7  Hematocrit 39.0 - 52.0 % 41.4 44.4 40.0  Platelets 150 - 400 K/uL 185 228 209   CMP Latest Ref Rng & Units 04/04/2019 04/03/2019 02/17/2018  Glucose 70 - 99 mg/dL 85 55(L) 88  BUN 8 - 23 mg/dL 10 12 15   Creatinine 0.61 - 1.24 mg/dL 1.20 1.35(H) 1.16  Sodium 135 - 145 mmol/L 140 138 137  Potassium 3.5 - 5.1 mmol/L 4.3 4.3 4.6  Chloride 98 - 111 mmol/L 108 102 106  CO2 22 - 32 mmol/L 22 28 23   Calcium 8.9 - 10.3 mg/dL 8.7(L) 9.6 8.8(L)  Total Protein 6.5 - 8.1 g/dL - 8.7(H) -  Total Bilirubin 0.3 - 1.2 mg/dL - 0.5 -  Alkaline Phos 38 - 126 U/L - 70 -  AST 15 - 41 U/L - 23 -  ALT 0 - 44 U/L - 23 -    Micro Results: Recent Results (from the past 240 hour(s))  Urine culture     Status: Abnormal   Collection Time: 04/03/19 12:47 PM   Specimen: Urine, Clean Catch  Result Value Ref Range Status   Specimen Description   Final    URINE,  CLEAN CATCH Performed at [facility], [address], [place], [place] [address]    Special Requests   Final    Normal Performed at [facility], [address] [place], [place] [address]    Culture (A)  Final    >=100,000 COLONIES/mL GROUP B STREP(S.AGALACTIAE)ISOLATED TESTING AGAINST S. AGALACTIAE NOT ROUTINELY PERFORMED DUE TO PREDICTABILITY OF AMP/PEN/VAN SUSCEPTIBILITY. Performed at [facility], [place] [address] [place],  [address]    Report Status 04/04/2019 FINAL  Final  Gastrointestinal Panel by PCR , Stool     Status: None   Collection Time: 04/03/19  2:21 PM   Specimen: Stool  Result Value Ref Range Status   Campylobacter species NOT DETECTED NOT DETECTED Final   Plesimonas shigelloides NOT DETECTED NOT DETECTED Final   Salmonella species NOT DETECTED NOT DETECTED Final   Yersinia enterocolitica NOT DETECTED NOT DETECTED Final   Vibrio species NOT DETECTED NOT DETECTED Final   Vibrio cholerae NOT DETECTED NOT DETECTED Final   Enteroaggregative E coli (EAEC) NOT DETECTED NOT DETECTED Final  Enteropathogenic E coli (EPEC) NOT DETECTED NOT DETECTED Final   Enterotoxigenic E coli (ETEC) NOT DETECTED NOT DETECTED Final   Shiga like toxin producing E coli (STEC) NOT DETECTED NOT DETECTED Final   Shigella/Enteroinvasive E coli (EIEC) NOT DETECTED NOT DETECTED Final   Cryptosporidium NOT DETECTED NOT DETECTED Final   Cyclospora cayetanensis NOT DETECTED NOT DETECTED Final   Entamoeba histolytica NOT DETECTED NOT DETECTED Final   Giardia lamblia NOT DETECTED NOT DETECTED Final   Adenovirus F40/41 NOT DETECTED NOT DETECTED Final   Astrovirus NOT DETECTED NOT DETECTED Final   Norovirus GI/GII NOT DETECTED NOT DETECTED Final   Rotavirus A NOT DETECTED NOT DETECTED Final   Sapovirus (I, II, IV, and V) NOT DETECTED NOT DETECTED Final    Comment: Performed at [facility], [address] [place], [place] [address]  SARS Coronavirus 2  (CEPHEID - Performed in [place] hospital lab), Hosp Order     Status: None   Collection Time: 04/03/19  2:47 PM   Specimen: Nasopharyngeal Swab  Result Value Ref Range Status   SARS Coronavirus 2 NEGATIVE NEGATIVE Final    Comment: (NOTE) If result is NEGATIVE SARS-CoV-2 target nucleic acids are NOT DETECTED. The SARS-CoV-2 RNA is generally detectable in upper and lower  respiratory specimens during the acute phase of infection. The lowest  concentration of SARS-CoV-2 viral copies this assay can detect is 250  copies / mL. A negative result does not preclude SARS-CoV-2 infection  and should not be used as the sole basis for treatment or other  patient management decisions.  A negative result may occur with  improper specimen collection / handling, submission of specimen other  than nasopharyngeal swab, presence of viral mutation(s) within the  areas targeted by this assay, and inadequate number of viral copies  (<250 copies / mL). A negative result must be combined with clinical  observations, patient history, and epidemiological information. If result is POSITIVE SARS-CoV-2 target nucleic acids are DETECTED. The SARS-CoV-2 RNA is generally detectable in upper and lower  respiratory specimens dur ing the acute phase of infection.  Positive  results are indicative of active infection with SARS-CoV-2.  Clinical  correlation with patient history and other diagnostic information is  necessary to determine patient infection status.  Positive results do  not rule out bacterial infection or co-infection with other viruses. If result is PRESUMPTIVE POSTIVE SARS-CoV-2 nucleic acids MAY BE PRESENT.   A presumptive positive result was obtained on the submitted specimen  and confirmed on repeat testing.  While 2019 novel coronavirus  (SARS-CoV-2) nucleic acids may be present in the submitted sample  additional confirmatory testing may be necessary for epidemiological  and / or clinical  management purposes  to differentiate between  SARS-CoV-2 and other Sarbecovirus currently known to infect humans.  If clinically indicated additional testing with an alternate test  methodology [phone]) is advised. The SARS-CoV-2 RNA is generally  detectable in upper and lower respiratory sp ecimens during the acute  phase of infection. The expected result is Negative. Fact Sheet for Patients:  StrictlyIdeas.no Fact Sheet for Healthcare Providers: BankingDealers.co.za This test is not yet approved or cleared by the [place] FDA and has been authorized for detection and/or diagnosis of SARS-CoV-2 by FDA under an Emergency Use Authorization (EUA).  This EUA will remain in effect (meaning this test can be used) for the duration of the COVID-19 declaration under Section 564(b)(1) of the Act, 21 U.S.C. section 360bbb-3(b)(1), unless the authorization is terminated  or revoked sooner. Performed at [facility], [address] [place], [place] [address]    Studies/Results: Dg Abd 1 View  Result Date: 04/07/2019 CLINICAL DATA:  Abdominal distention. EXAM: ABDOMEN - 1 VIEW COMPARISON:  BE 04/04/2019.  CT 04/02/2019. FINDINGS: Transverse and left colonic distention again noted without interim change. Soft tissues are unremarkable. Vascular calcifications in the pelvis. Degenerative change lumbar spine and right hip. Total left hip replacement. No acute bony abnormality. IMPRESSION: Transverse and left colonic distention again noted without interim change. No small bowel distention. No free air. Electronically Signed   By: Marcello Moores  Register   On: 04/07/2019 06:36   Medications:  I have reviewed the patient's current medications. Prior to Admission:  Medications Prior to Admission  Medication Sig Dispense Refill Last Dose  . acetaminophen (TYLENOL) 650 MG CR tablet Take 1,300 mg by mouth every 12 (twelve) hours.   04/03/2019 at 0829  .  ALPRAZolam (XANAX) 0.5 MG tablet Take 1 tablet (0.5 mg total) by mouth 3 (three) times daily. 5 tablet 0 04/03/2019 at 0533  . Alum & Mag Hydroxide-Simeth (MYLANTA PO) Take 30 mLs by mouth every 12 (twelve) hours as needed.   04/03/2019 at 0533  . aspirin 81 MG tablet Take 81 mg by mouth daily.   04/03/2019 at 0829  . Dextromethorphan-Benzocaine (CEPACOL SORE THROAT & COUGH) 5-7.5 MG LOZG Use as directed 1 lozenge in the mouth or throat every 2 (two) hours as needed (sore throat).   03/28/2019 at 1422  . Dextromethorphan-guaiFENesin 10-100 MG/5ML liquid Take 5 mLs by mouth every 4 (four) hours as needed.   03/29/2019 at 1757  . ergocalciferol (VITAMIN D2) 50000 units capsule Take 50,000 Units by mouth every 30 (thirty) days.   03/11/2019 at 1611  . furosemide (LASIX) 20 MG tablet Take 20 mg by mouth daily.   04/03/2019 at 0829  . gabapentin (NEURONTIN) 400 MG capsule Take 2 capsules (800 mg total) by mouth 3 (three) times daily. 30 capsule 0 04/03/2019 at 0733  . HYDROcodone-acetaminophen (NORCO) 7.5-325 MG tablet Take 1 tablet by mouth every 8 (eight) hours as needed for moderate pain.   03/26/2019 at 0812  . lactulose (CHRONULAC) 10 GM/15ML solution Take 20 g by mouth daily.   04/03/2019 at 0829  . levETIRAcetam (KEPPRA) 500 MG tablet Take 500 mg by mouth 2 (two) times daily.    04/03/2019 at 0829  . lisinopril (PRINIVIL,ZESTRIL) 5 MG tablet Take 7.5 mg by mouth daily.   04/03/2019 at 0829  . loratadine (CLARITIN) 10 MG tablet Take 10 mg by mouth daily.   04/03/2019 at 0829  . nicotine polacrilex (NICORETTE) 2 MG gum Take 2 mg by mouth every 4 (four) hours as needed for smoking cessation.   04/03/2019 at 0830  . nitroGLYCERIN (NITROSTAT) 0.4 MG SL tablet Place 0.4 mg under the tongue every 5 (five) minutes as needed for chest pain.   prn at prn  . nystatin (NYSTATIN) powder Apply topically 2 (two) times daily. Groin rash   04/03/2019 at 0829  . ondansetron (ZOFRAN) 4 MG tablet Take 4 mg by mouth every 8 (eight)  hours as needed for nausea or vomiting.   prn at prn  . pantoprazole (PROTONIX) 20 MG tablet Take 20 mg by mouth daily.   04/03/2019 at 0533  . polyethylene glycol (MIRALAX / GLYCOLAX) packet Take 17 g by mouth daily.    04/03/2019 at 0829  . sertraline (ZOLOFT) 25 MG tablet Take 25 mg by mouth daily.  04/03/2019 at 0829  . simvastatin (ZOCOR) 10 MG tablet Take 10 mg by mouth at bedtime.    04/02/2019 at 2010  . tiotropium (SPIRIVA) 18 MCG inhalation capsule Place 18 mcg into inhaler and inhale daily.   04/03/2019 at 0829  . trolamine salicylate (ASPERCREME) 10 % cream Apply 1 application topically 3 (three) times daily. To neck   04/03/2019 at 0829  . acetaminophen (TYLENOL) 500 MG tablet Take 500 mg by mouth 3 (three) times daily.   unknown at prn   Scheduled: . ALPRAZolam  0.5 mg Oral TID  . aspirin EC  81 mg Oral Daily  . furosemide  20 mg Oral Daily  . gabapentin  800 mg Oral TID  . heparin  5,000 Units Subcutaneous Q8H  . levETIRAcetam  500 mg Oral BID  . lisinopril  7.5 mg Oral Daily  . loratadine  10 mg Oral Daily  . nicotine  21 mg Transdermal Daily  . sertraline  25 mg Oral Daily  . simvastatin  10 mg Oral QHS  . tiotropium  18 mcg Inhalation Daily   Continuous:  IRS:[id number] **OR** acetaminophen, guaiFENesin, HYDROcodone-acetaminophen, nicotine polacrilex, nitroGLYCERIN, ondansetron **OR** ondansetron (ZOFRAN) IV, trolamine salicylate Anti-infectives (From admission, onward)   Start     Dose/Rate Route Frequency Ordered Stop   04/03/19 1430  cefTRIAXone (ROCEPHIN) 2 g in sodium chloride 0.9 % 100 mL IVPB  Status:  Discontinued     2 g 200 mL/hr over 30 Minutes Intravenous  Once 04/03/19 1420 04/03/19 1429   04/03/19 1430  piperacillin-tazobactam (ZOSYN) IVPB 3.375 g     3.375 g 100 mL/hr over 30 Minutes Intravenous  Once 04/03/19 1429 04/03/19 1525     Scheduled Meds: . ALPRAZolam  0.5 mg Oral TID  . aspirin EC  81 mg Oral Daily  . furosemide  20 mg Oral Daily  .  gabapentin  800 mg Oral TID  . heparin  5,000 Units Subcutaneous Q8H  . levETIRAcetam  500 mg Oral BID  . lisinopril  7.5 mg Oral Daily  . loratadine  10 mg Oral Daily  . nicotine  21 mg Transdermal Daily  . sertraline  25 mg Oral Daily  . simvastatin  10 mg Oral QHS  . tiotropium  18 mcg Inhalation Daily   Continuous Infusions: PRN Meds:.acetaminophen **OR** acetaminophen, guaiFENesin, HYDROcodone-acetaminophen, nicotine polacrilex, nitroGLYCERIN, ondansetron **OR** ondansetron (ZOFRAN) IV, trolamine salicylate   Assessment: Active Problems:   Abdominal pain   Ogilvie's syndrome  Currently having bowel movements, abdominal distention is better  Plan: Advance diet as tolerated Continue bowel regimen Out of bed to chair and ambulation as tolerated Recommend outpatient colonoscopy with GI follow-up Monitor electrolytes closely, maintain potassium greater than 4, magnesium above 2   LOS: 4 days     04/07/2019, 5:57 PM

## 2019-04-07 NOTE — Progress Notes (Addendum)
[facility] SURGICAL PROGRESS NOTE (cpt [phone])  Hospital Day(s): 4.   Post op day(s):  Marland Kitchen   Interval History: Patient seen and examined, no acute events or new complaints overnight. Patient reports that he is feeling good. He feels that his abdominal distension has improved although his KUB this morning still has unchanged colonic distension. No reports of abdominal pain, nausea, emesis, fever, chills. He has tolerated clear liquids. There are two reported bowel movements yesterday per chart. No other acute complaints.   Review of Systems:  Constitutional: denies fever, chills  HEENT: denies cough or congestion  Respiratory: denies any shortness of breath  Cardiovascular: denies chest pain or palpitations  Gastrointestinal: + abdominal distension, denies abdominal pain, N/V, or diarrhea/and bowel function as per interval history Genitourinary: denies burning with urination or urinary frequency  Vital signs in last 24 hours: [min-max] current  Temp:  [97.4 F (36.3 C)-99 F (37.2 C)] 97.4 F (36.3 C) (08/03 0506) Pulse Rate:  [63-94] 63 (08/03 0506) Resp:  [14-20] 14 (08/03 0506) BP: (57-120)/(42-86) 96/72 (08/03 0506) SpO2:  [96 %-99 %] 99 % (08/03 0506)     Height: 5\' 11"  (180.3 cm) Weight: 96.6 kg BMI (Calculated): 29.72   Intake/Output last 2 shifts:  08/02 0701 - 08/03 0700 In: 1740 [P.O.:1740] Out: 850 [Urine:850]   Physical Exam:  Constitutional: alert, cooperative and no distress  HENT: normocephalic without obvious abnormality  Eyes: PERRL, EOM's grossly intact and symmetric  Respiratory: breathing non-labored at rest  Gastrointestinal: soft, non-tender, still distended and tympani to palpation, no rebound/guarding. No peritonitis Musculoskeletal: no edema or wounds, motor and sensation grossly intact, NT    Labs:  CBC Latest Ref Rng & Units 04/04/2019 04/03/2019 02/17/2018  WBC 4.0 - 10.5 K/uL 4.0 4.9 3.9  Hemoglobin 13.0 - 17.0 g/dL 13.2 13.9 13.7   Hematocrit 39.0 - 52.0 % 41.4 44.4 40.0  Platelets 150 - 400 K/uL 185 228 209   CMP Latest Ref Rng & Units 04/04/2019 04/03/2019 02/17/2018  Glucose 70 - 99 mg/dL 85 55(L) 88  BUN 8 - 23 mg/dL 10 12 15   Creatinine 0.61 - 1.24 mg/dL 1.20 1.35(H) 1.16  Sodium 135 - 145 mmol/L 140 138 137  Potassium 3.5 - 5.1 mmol/L 4.3 4.3 4.6  Chloride 98 - 111 mmol/L 108 102 106  CO2 22 - 32 mmol/L 22 28 23   Calcium 8.9 - 10.3 mg/dL 8.7(L) 9.6 8.8(L)  Total Protein 6.5 - 8.1 g/dL - 8.7(H) -  Total Bilirubin 0.3 - 1.2 mg/dL - 0.5 -  Alkaline Phos 38 - 126 U/L - 70 -  AST 15 - 41 U/L - 23 -  ALT 0 - 44 U/L - 23 -     Imaging studies:   KUB (04/07/2019) personally reviewed still with colonic distension and radiologist report reviewed:  IMPRESSION: Transverse and left colonic distention again noted without interim change. No small bowel distention. No free air.   Assessment/Plan: (ICD-10's: K71.8) 68 y.o. 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      - Clear liquids, unclear if having bowel function/flatus   - pain control prn (minimize narcotics); antiemetics prn  - serial abdominal examination +/- KUBs  - monitor ongoing bowel function  - No indication for emergent surgical intervention  - Appreciate GI input and recommendations   - mobilization encouraged   - further  management per primary team  All of the above findings and recommendations were discussed with the patient, and the medical team, and all of patient's questions were answered to his expressed satisfaction.   -- Edison Simon, PA-C [facility] 04/07/2019, 8:07 AM [phone] M-F: 7am - 4pm  At this point, I think it's time to consider enemas or neostigmine to alleviate the colonic dilation. He is not ready for d/c at this time. Repeat AXR in  the AM.  I saw and evaluated the patient.  I agree with the above documentation, exam, and plan, which I have edited where appropriate. Fredirick Maudlin  10:40 AM

## 2019-04-07 NOTE — Care Management Important Message (Signed)
Important Message  Patient Details  Name: MARLEY CHARLOT MRN: [id number] Date of Birth: 02/22/1951   Medicare Important Message Given:  Yes     Dannette Barbara 04/07/2019, 11:44 AM

## 2019-04-08 ENCOUNTER — Inpatient Hospital Stay: Payer: Medicare Other

## 2019-04-08 NOTE — TOC Transition Note (Signed)
Transition of Care [facility]) - CM/SW Discharge Note   Patient Details  Name: Calvin [name] MRN: [id number] Date of Birth: 1951/06/08  [facility] CM/SW Contact:  Candie Chroman, LCSW Phone Number: 04/08/2019, 12:30 PM   Clinical Narrative: Patient has orders to discharge back to [facility] today. SNF is aware. RN will call report prior to calling EMS at [phone]. No further concerns. CSW signing off.    Final next level of care: Skilled Nursing Facility Barriers to Discharge: Barriers Resolved   Patient Goals and CMS Choice     Choice offered to / list presented to : NA  Discharge Placement   Existing PASRR number confirmed : 04/04/19          Patient chooses bed at: [facility] Patient to be transferred to facility by: EMS Name of family member notified: [name] Patient and family notified of of transfer: 04/08/19  Discharge Plan and Services     Post Acute Care Choice: [place] [facility]                               Social Determinants of Health (SDOH) Interventions     Readmission Risk Interventions No flowsheet data found.

## 2019-04-08 NOTE — Progress Notes (Addendum)
[facility] (cpt [phone])  Hospital Day(s): 5.   Post op day(s):  Marland Kitchen   Interval History: Patient seen and examined, no acute events or new complaints overnight. Patient reports that he is feeling better this morning. No complaints of fever, chills, nausea, emesis, or abdominal pain. Abdominal distension continues to be improved. Tolerating diet. Reports flatus. KUB looks improved this morning. No other complaints.   Review of Systems:  Constitutional: denies fever, chills  HEENT: denies cough or congestion  Respiratory: denies any shortness of breath  Cardiovascular: denies chest pain or palpitations  Gastrointestinal: + distension (improved), denies abdominal pain, N/V, or diarrhea/and bowel function as per interval history Genitourinary: denies burning with urination or urinary frequency  Vital signs in last 24 hours: [min-max] current  Temp:  [97.6 F (36.4 C)-97.8 F (36.6 C)] 97.6 F (36.4 C) (08/04 0525) Pulse Rate:  [69-73] 70 (08/04 0817) Resp:  [16-18] 16 (08/04 0525) BP: (96-139)/(66-87) 106/74 (08/04 0817) SpO2:  [96 %-99 %] 99 % (08/04 0525)     Height: 5\' 11"  (180.3 cm) Weight: 96.6 kg BMI (Calculated): 29.72   Intake/Output last 2 shifts:  08/03 0701 - 08/04 0700 In: 1960 [P.O.:1960] Out: 3050 [Urine:3050]   Physical Exam:  Constitutional: alert, cooperative and no distress  HENT: normocephalic without obvious abnormality  Eyes: PERRL, EOM's grossly intact and symmetric  Respiratory: breathing non-labored at rest  Cardiovascular: regular rate and sinus rhythm  Gastrointestinal:soft, non-tender, and much improved distension, no rebound/guarding Musculoskeletal: no edema or wounds, motor and sensation grossly intact, NT    Labs:  CBC Latest Ref Rng & Units 04/04/2019 04/03/2019 02/17/2018  WBC 4.0 - 10.5 K/uL 4.0 4.9 3.9  Hemoglobin 13.0 - 17.0 g/dL 13.2 13.9 13.7  Hematocrit 39.0 - 52.0 % 41.4 44.4 40.0  Platelets 150 - 400  K/uL 185 228 209   CMP Latest Ref Rng & Units 04/04/2019 04/03/2019 02/17/2018  Glucose 70 - 99 mg/dL 85 55(L) 88  BUN 8 - 23 mg/dL 10 12 15   Creatinine 0.61 - 1.24 mg/dL 1.20 1.35(H) 1.16  Sodium 135 - 145 mmol/L 140 138 137  Potassium 3.5 - 5.1 mmol/L 4.3 4.3 4.6  Chloride 98 - 111 mmol/L 108 102 106  CO2 22 - 32 mmol/L 22 28 23   Calcium 8.9 - 10.3 mg/dL 8.7(L) 9.6 8.8(L)  Total Protein 6.5 - 8.1 g/dL - 8.7(H) -  Total Bilirubin 0.3 - 1.2 mg/dL - 0.5 -  Alkaline Phos 38 - 126 U/L - 70 -  AST 15 - 41 U/L - 23 -  ALT 0 - 44 U/L - 23 -    Imaging studies:   KUB (04/08/2019) personally reviewed with mild improvement in colonic distension, and radiologist report reviewed:  IMPRESSION: Slightly decreased colonic distension.   Assessment/Plan: (ICD-10's: K3.8) 68 y.o. 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      - ADAT   - pain control prn (minimize narcotics); antiemetics prn             - serial abdominal examination +/- KUBs             - monitor ongoing bowel function             - No indication for emergent surgical intervention             -  Appreciate GI input and recommendations              - mobilization encouraged              - further management per primary team   - No surgical issues at this time; general surgery will sign of   All of the above findings and recommendations were discussed with the patient, and the medical team, and all of patient's questions were answered to his expressed satisfaction.  -- Edison Simon, PA-C [facility] 04/08/2019, 8:27 AM [phone] M-F: 7am - 4pm  I saw and evaluated the patient.  I agree with the above documentation, exam, and plan, which I have edited where appropriate. Fredirick Maudlin  1:38 PM

## 2019-04-08 NOTE — Discharge Summary (Signed)
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 Alcohol abuse    drinks on weekend  . Anemia   . Anxiety   . Arthritis   . Cancer ([place])    colon,throat  . COPD (chronic obstructive pulmonary disease) ([place])   . Coronary artery disease   . Depression   . Gout   . Hypertension   . Myocardial infarction ([place])   . Neuromuscular disorder ([place])   . Seizures ([place])    last 6 months ago  . Stroke [facility])    multiple  left side weakness  . 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  1.  Pseudoobstruction/Ogilvie syndrome: Patient was followed closely by surgery and GI.  Patient without abdominal pain or distention this morning.  Patient is tolerating diet. No surgical intervention needed.  He needs to continue on bowel regimen.    2.  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  3. Tobacco dependence: Patient is encouraged to quit smoking and willing to attempt to quit was assessed  4.  Depression/anxiety: Continue Zoloft and Ativan  5.  Hyperlipidemia: Continue statin  6.  COPD without signs of exacerbation: Continue Spiriva  7.  Essential hypertension: Blood pressure well controlled.  Continue lisinopril   DISCHARGE CONDITIONS AND DIET:   Stable for discharge regular diet  CONSULTS OBTAINED:   Treatment Team:  Fredirick Maudlin, MD Jonathon Bellows, MD Bettey Costa, MD  DRUG ALLERGIES:  No Known Allergies  DISCHARGE MEDICATIONS:   Allergies as of 04/08/2019   No Known Allergies     Medication List    TAKE these medications   acetaminophen 500 MG tablet Commonly known as: TYLENOL Take 500 mg by mouth 3 (three) times daily.   acetaminophen 650 MG CR tablet Commonly known as: TYLENOL Take 1,300 mg by mouth every 12 (twelve) hours.   ALPRAZolam 0.5 MG tablet Commonly known as: XANAX Take 1 tablet (0.5 mg total) by mouth 3 (three) times daily.   aspirin 81 MG tablet Take 81 mg by mouth daily.   Cepacol Sore Throat & Cough 5-7.5 MG Lozg Generic drug: Dextromethorphan-Benzocaine Use as directed 1 lozenge in the mouth or throat every 2 (two) hours as needed (sore throat).   Dextromethorphan-guaiFENesin 10-100 MG/5ML liquid Take 5 mLs by mouth every 4 (four) hours as needed.   ergocalciferol 1.25 MG (50000 UT) capsule Commonly known as: VITAMIN D2 Take 50,000 Units by mouth every 30 (thirty) days.   furosemide 20 MG tablet Commonly known as: LASIX Take 20 mg by mouth daily.   gabapentin 400 MG capsule Commonly known as: NEURONTIN Take 2 capsules (800 mg total) by mouth 3 (three) times daily.   HYDROcodone-acetaminophen 7.5-325 MG tablet Commonly known as: NORCO Take 1 tablet by mouth every 8 (eight) hours as needed for moderate pain.   lactulose 10 GM/15ML  solution Commonly known as: CHRONULAC Take 20 g by mouth daily.   levETIRAcetam 500 MG tablet Commonly known as: KEPPRA Take 500 mg by mouth 2 (two) times daily.   lisinopril 5 MG tablet Commonly known as: ZESTRIL Take 7.5 mg by mouth daily.   loratadine 10 MG tablet Commonly known as: CLARITIN Take 10 mg by mouth daily.   MYLANTA PO Take 30 mLs by mouth every 12 (twelve) hours as needed.   nicotine polacrilex 2 MG gum Commonly known as: NICORETTE Take 2 mg by mouth every 4 (four) hours as  needed for smoking cessation.   nitroGLYCERIN 0.4 MG SL tablet Commonly known as: NITROSTAT Place 0.4 mg under the tongue every 5 (five) minutes as needed for chest pain.   nystatin powder Generic drug: nystatin Apply topically 2 (two) times daily. Groin rash   ondansetron 4 MG tablet Commonly known as: ZOFRAN Take 4 mg by mouth every 8 (eight) hours as needed for nausea or vomiting.   pantoprazole 20 MG tablet Commonly known as: PROTONIX Take 20 mg by mouth daily.   polyethylene glycol 17 g packet Commonly known as: MIRALAX / GLYCOLAX Take 17 g by mouth daily.   sertraline 25 MG tablet Commonly known as: ZOLOFT Take 25 mg by mouth daily.   simvastatin 10 MG tablet Commonly known as: ZOCOR Take 10 mg by mouth at bedtime.   tiotropium 18 MCG inhalation capsule Commonly known as: SPIRIVA Place 18 mcg into inhaler and inhale daily.   trolamine salicylate 10 % cream Commonly known as: ASPERCREME Apply 1 application topically 3 (three) times daily. To neck         Today   CHIEF COMPLAINT:  Patient doing well this morning.  No abdominal pain or nausea.   VITAL SIGNS:  Blood pressure 106/74, pulse 70, temperature 97.6 F (36.4 C), temperature source Oral, resp. rate 16, height 5\' 11"  (1.803 m), weight 96.6 kg, SpO2 99 %.   REVIEW OF SYSTEMS:  Review of Systems  Constitutional: Negative.  Negative for chills, fever and malaise/fatigue.  HENT: Negative.  Negative for ear discharge, ear pain, hearing loss, nosebleeds and sore throat.   Eyes: Negative.  Negative for blurred vision and pain.  Respiratory: Negative.  Negative for cough, hemoptysis, shortness of breath and wheezing.   Cardiovascular: Negative.  Negative for chest pain, palpitations and leg swelling.  Gastrointestinal: Negative.  Negative for abdominal pain, blood in stool, diarrhea, nausea and vomiting.  Genitourinary: Negative.  Negative for dysuria.  Musculoskeletal: Negative.  Negative for back  pain.  Skin: Negative.   Neurological: Negative for dizziness, tremors, speech change, focal weakness, seizures and headaches.  Endo/Heme/Allergies: Negative.  Does not bruise/bleed easily.  Psychiatric/Behavioral: Negative.  Negative for depression, hallucinations and suicidal ideas.     PHYSICAL EXAMINATION:  GENERAL:  68 y.o.-year-old patient lying in the bed with no acute distress.  NECK:  Supple, no jugular venous distention. No thyroid enlargement, no tenderness.  LUNGS: Normal breath sounds bilaterally, no wheezing, rales,rhonchi  No use of accessory muscles of respiration.  CARDIOVASCULAR: S1, S2 normal. No murmurs, rubs, or gallops.  ABDOMEN: Soft, non-tender, non-distended. Bowel sounds present. No organomegaly or mass.  EXTREMITIES: No pedal edema, cyanosis, or clubbing.  PSYCHIATRIC: The patient is alert and oriented x 3.  SKIN: No obvious rash, lesion, or ulcer.   DATA REVIEW:   CBC Recent Labs  Lab 04/04/19 0628  WBC 4.0  HGB 13.2  HCT 41.4  PLT 185    Chemistries  Recent Labs  Lab 04/03/19 1153 04/04/19 0628  NA 138 140  K 4.3 4.3  CL 102 108  CO2 28 22  GLUCOSE 55* 85  BUN 12 10  CREATININE 1.35* 1.20  CALCIUM 9.6 8.7*  AST 23  --   ALT 23  --   ALKPHOS 70  --   BILITOT 0.5  --     Cardiac Enzymes No results for input(s): TROPONINI in the last 168 hours.  Microbiology Results  @[email or web address] @  RADIOLOGY:  Dg Abd 1 View  Result Date: 04/08/2019 CLINICAL DATA:  Ogilvie syndrome. EXAM: ABDOMEN - 1 VIEW COMPARISON:  04/07/2019 FINDINGS: There is mild gaseous distension of the colon to the level of the splenic flexure, overall slightly decreased from the prior study. Gas is present in nondilated colon more distally as well as rectum. No dilated small bowel loops are seen. There is persistent elevation of the right hemidiaphragm. A left hip arthroplasty is noted. IMPRESSION: Slightly decreased colonic distension. Electronically Signed   By: Logan Bores M.D.   On: 04/08/2019 08:13   Dg Abd 1 View  Result Date: 04/07/2019 CLINICAL DATA:  Abdominal distention. EXAM: ABDOMEN - 1 VIEW COMPARISON:  BE 04/04/2019.  CT 04/02/2019. FINDINGS: Transverse and left colonic distention again noted without interim change. Soft tissues are unremarkable. Vascular calcifications in the pelvis. Degenerative change lumbar spine and right hip. Total left hip replacement. No acute bony abnormality. IMPRESSION: Transverse and left colonic distention again noted without interim change. No small bowel distention. No free air. Electronically Signed   By: Marcello Moores  Register   On: 04/07/2019 06:36      Allergies as of 04/08/2019   No Known Allergies     Medication List    TAKE these medications   acetaminophen 500 MG tablet Commonly known as: TYLENOL Take 500 mg by mouth 3 (three) times daily.   acetaminophen 650 MG CR tablet Commonly known as: TYLENOL Take 1,300 mg by mouth every 12 (twelve) hours.   ALPRAZolam 0.5 MG tablet Commonly known as: XANAX Take 1 tablet (0.5 mg total) by mouth 3 (three) times daily.   aspirin 81 MG tablet Take 81 mg by mouth daily.   Cepacol Sore Throat & Cough 5-7.5 MG Lozg Generic drug: Dextromethorphan-Benzocaine Use as directed 1 lozenge in the mouth or throat every 2 (two) hours as needed (sore throat).   Dextromethorphan-guaiFENesin 10-100 MG/5ML liquid Take 5 mLs by mouth every 4 (four) hours as needed.   ergocalciferol 1.25 MG (50000 UT) capsule Commonly known as: VITAMIN D2 Take 50,000 Units by mouth every 30 (thirty) days.   furosemide 20 MG tablet Commonly known as: LASIX Take 20 mg by mouth daily.   gabapentin 400 MG capsule Commonly known as: NEURONTIN Take 2 capsules (800 mg total) by mouth 3 (three) times daily.   HYDROcodone-acetaminophen 7.5-325 MG tablet Commonly known as: NORCO Take 1 tablet by mouth every 8 (eight) hours as needed for moderate pain.   lactulose 10 GM/15ML solution Commonly  known as: CHRONULAC Take 20 g by mouth daily.   levETIRAcetam 500 MG tablet Commonly known as: KEPPRA Take 500 mg by mouth 2 (two) times daily.   lisinopril 5 MG tablet Commonly known as: ZESTRIL Take 7.5 mg by mouth daily.   loratadine 10 MG tablet Commonly known as: CLARITIN Take 10 mg by mouth daily.   MYLANTA PO Take 30 mLs by mouth every 12 (twelve) hours as needed.   nicotine polacrilex 2 MG gum Commonly known  as: NICORETTE Take 2 mg by mouth every 4 (four) hours as needed for smoking cessation.   nitroGLYCERIN 0.4 MG SL tablet Commonly known as: NITROSTAT Place 0.4 mg under the tongue every 5 (five) minutes as needed for chest pain.   nystatin powder Generic drug: nystatin Apply topically 2 (two) times daily. Groin rash   ondansetron 4 MG tablet Commonly known as: ZOFRAN Take 4 mg by mouth every 8 (eight) hours as needed for nausea or vomiting.   pantoprazole 20 MG tablet Commonly known as: PROTONIX Take 20 mg by mouth daily.   polyethylene glycol 17 g packet Commonly known as: MIRALAX / GLYCOLAX Take 17 g by mouth daily.   sertraline 25 MG tablet Commonly known as: ZOLOFT Take 25 mg by mouth daily.   simvastatin 10 MG tablet Commonly known as: ZOCOR Take 10 mg by mouth at bedtime.   tiotropium 18 MCG inhalation capsule Commonly known as: SPIRIVA Place 18 mcg into inhaler and inhale daily.   trolamine salicylate 10 % cream Commonly known as: ASPERCREME Apply 1 application topically 3 (three) times daily. To neck         Management plans discussed with the patient and he is in agreement. Stable for discharge   Patient should follow up with pcp  CODE STATUS:     Code Status Orders  (From admission, onward)         Start     Ordered   04/03/19 1711  Full code  Continuous     04/03/19 1710        Code Status History    Date Active Date Inactive Code Status Order ID Comments User Context   04/03/2019 1627 04/03/2019 1710 DNR [id number]   Dustin Flock, MD ED   09/30/2017 2301 10/02/2017 2039 Full Code [id number]  Lance Coon, MD Inpatient   Advance Care Planning Activity      TOTAL TIME TAKING CARE OF THIS PATIENT: 38 minutes.    Note: This dictation was prepared with Dragon dictation along with smaller phrase technology. Any transcriptional errors that result from this process are unintentional.  Bettey Costa M.D on 04/08/2019 at 10:04 AM  Between 7am to 6pm - Pager - [phone] After 6pm go to [email or web address] - password [email or web address]  Office  [phone]  CC: Primary care physician; Lynnell Jude, MD

## 2019-04-08 NOTE — Progress Notes (Signed)
Calvin [name]  A and O x 4. VSS. Pt tolerating diet well. No complaints of pain or nausea. IV removed intact, prescriptions given. Pt voiced understanding of discharge instructions with no further questions. Pt discharged EMS to [facility].   Allergies as of 04/08/2019   No Known Allergies     Medication List    TAKE these medications   acetaminophen 500 MG tablet Commonly known as: TYLENOL Take 500 mg by mouth 3 (three) times daily.   acetaminophen 650 MG CR tablet Commonly known as: TYLENOL Take 1,300 mg by mouth every 12 (twelve) hours.   ALPRAZolam 0.5 MG tablet Commonly known as: XANAX Take 1 tablet (0.5 mg total) by mouth 3 (three) times daily.   aspirin 81 MG tablet Take 81 mg by mouth daily.   Cepacol Sore Throat & Cough 5-7.5 MG Lozg Generic drug: Dextromethorphan-Benzocaine Use as directed 1 lozenge in the mouth or throat every 2 (two) hours as needed (sore throat).   Dextromethorphan-guaiFENesin 10-100 MG/5ML liquid Take 5 mLs by mouth every 4 (four) hours as needed.   ergocalciferol 1.25 MG (50000 UT) capsule Commonly known as: VITAMIN D2 Take 50,000 Units by mouth every 30 (thirty) days.   furosemide 20 MG tablet Commonly known as: LASIX Take 20 mg by mouth daily.   gabapentin 400 MG capsule Commonly known as: NEURONTIN Take 2 capsules (800 mg total) by mouth 3 (three) times daily.   HYDROcodone-acetaminophen 7.5-325 MG tablet Commonly known as: NORCO Take 1 tablet by mouth every 8 (eight) hours as needed for moderate pain.   lactulose 10 GM/15ML solution Commonly known as: CHRONULAC Take 20 g by mouth daily.   levETIRAcetam 500 MG tablet Commonly known as: KEPPRA Take 500 mg by mouth 2 (two) times daily.   lisinopril 5 MG tablet Commonly known as: ZESTRIL Take 7.5 mg by mouth daily.   loratadine 10 MG tablet Commonly known as: CLARITIN Take 10 mg by mouth daily.   MYLANTA PO Take 30 mLs by mouth every 12 (twelve) hours as needed.    nicotine polacrilex 2 MG gum Commonly known as: NICORETTE Take 2 mg by mouth every 4 (four) hours as needed for smoking cessation.   nitroGLYCERIN 0.4 MG SL tablet Commonly known as: NITROSTAT Place 0.4 mg under the tongue every 5 (five) minutes as needed for chest pain.   nystatin powder Generic drug: nystatin Apply topically 2 (two) times daily. Groin rash   ondansetron 4 MG tablet Commonly known as: ZOFRAN Take 4 mg by mouth every 8 (eight) hours as needed for nausea or vomiting.   pantoprazole 20 MG tablet Commonly known as: PROTONIX Take 20 mg by mouth daily.   polyethylene glycol 17 g packet Commonly known as: MIRALAX / GLYCOLAX Take 17 g by mouth daily.   sertraline 25 MG tablet Commonly known as: ZOLOFT Take 25 mg by mouth daily.   simvastatin 10 MG tablet Commonly known as: ZOCOR Take 10 mg by mouth at bedtime.   tiotropium 18 MCG inhalation capsule Commonly known as: SPIRIVA Place 18 mcg into inhaler and inhale daily.   trolamine salicylate 10 % cream Commonly known as: ASPERCREME Apply 1 application topically 3 (three) times daily. To neck       Vitals:   04/08/19 0525 04/08/19 0817  BP: 139/87 106/74  Pulse: 73 70  Resp: 16   Temp: 97.6 F (36.4 C)   SpO2: 99%     Francesco Sor

## 2019-04-08 NOTE — Progress Notes (Signed)
RN call report to [facility]. Report given to Amy LPN.

## 2019-08-03 ENCOUNTER — Emergency Department: Payer: Medicare Other

## 2019-08-03 ENCOUNTER — Encounter: Payer: Self-pay | Admitting: Emergency Medicine

## 2019-08-03 ENCOUNTER — Inpatient Hospital Stay: Payer: Medicare Other

## 2019-08-03 ENCOUNTER — Inpatient Hospital Stay
Admission: EM | Admit: 2019-08-03 | Discharge: 2019-08-14 | DRG: 388 | Disposition: A | Discharge status: Skilled Nursing Facility | Payer: Medicare Other | Source: Skilled Nursing Facility | Attending: Internal Medicine | Admitting: Internal Medicine

## 2019-08-03 ENCOUNTER — Other Ambulatory Visit: Payer: Self-pay

## 2019-08-03 DIAGNOSIS — Z8249 Family history of ischemic heart disease and other diseases of the circulatory system: Secondary | ICD-10-CM

## 2019-08-03 DIAGNOSIS — I252 Old myocardial infarction: Secondary | ICD-10-CM

## 2019-08-03 DIAGNOSIS — I2510 Atherosclerotic heart disease of native coronary artery without angina pectoris: Secondary | ICD-10-CM | POA: Diagnosis present

## 2019-08-03 DIAGNOSIS — I4581 Long QT syndrome: Secondary | ICD-10-CM | POA: Diagnosis present

## 2019-08-03 DIAGNOSIS — K567 Ileus, unspecified: Secondary | ICD-10-CM | POA: Diagnosis present

## 2019-08-03 DIAGNOSIS — M109 Gout, unspecified: Secondary | ICD-10-CM | POA: Diagnosis present

## 2019-08-03 DIAGNOSIS — N3000 Acute cystitis without hematuria: Secondary | ICD-10-CM | POA: Diagnosis present

## 2019-08-03 DIAGNOSIS — G459 Transient cerebral ischemic attack, unspecified: Secondary | ICD-10-CM | POA: Diagnosis not present

## 2019-08-03 DIAGNOSIS — I440 Atrioventricular block, first degree: Secondary | ICD-10-CM | POA: Diagnosis not present

## 2019-08-03 DIAGNOSIS — K5981 Ogilvie syndrome: Secondary | ICD-10-CM | POA: Diagnosis present

## 2019-08-03 DIAGNOSIS — E538 Deficiency of other specified B group vitamins: Secondary | ICD-10-CM | POA: Diagnosis present

## 2019-08-03 DIAGNOSIS — R531 Weakness: Secondary | ICD-10-CM | POA: Diagnosis not present

## 2019-08-03 DIAGNOSIS — R29702 NIHSS score 2: Secondary | ICD-10-CM | POA: Diagnosis not present

## 2019-08-03 DIAGNOSIS — B951 Streptococcus, group B, as the cause of diseases classified elsewhere: Secondary | ICD-10-CM | POA: Diagnosis present

## 2019-08-03 DIAGNOSIS — Z8589 Personal history of malignant neoplasm of other organs and systems: Secondary | ICD-10-CM

## 2019-08-03 DIAGNOSIS — N390 Urinary tract infection, site not specified: Secondary | ICD-10-CM | POA: Diagnosis present

## 2019-08-03 DIAGNOSIS — R140 Abdominal distension (gaseous): Secondary | ICD-10-CM

## 2019-08-03 DIAGNOSIS — Z20828 Contact with and (suspected) exposure to other viral communicable diseases: Secondary | ICD-10-CM | POA: Diagnosis present

## 2019-08-03 DIAGNOSIS — Z96642 Presence of left artificial hip joint: Secondary | ICD-10-CM | POA: Diagnosis present

## 2019-08-03 DIAGNOSIS — G9341 Metabolic encephalopathy: Secondary | ICD-10-CM | POA: Diagnosis not present

## 2019-08-03 DIAGNOSIS — E871 Hypo-osmolality and hyponatremia: Secondary | ICD-10-CM | POA: Diagnosis not present

## 2019-08-03 DIAGNOSIS — J449 Chronic obstructive pulmonary disease, unspecified: Secondary | ICD-10-CM | POA: Diagnosis present

## 2019-08-03 DIAGNOSIS — R9431 Abnormal electrocardiogram [ECG] [EKG]: Secondary | ICD-10-CM | POA: Diagnosis present

## 2019-08-03 DIAGNOSIS — E876 Hypokalemia: Secondary | ICD-10-CM | POA: Diagnosis present

## 2019-08-03 DIAGNOSIS — I10 Essential (primary) hypertension: Secondary | ICD-10-CM | POA: Diagnosis present

## 2019-08-03 DIAGNOSIS — F39 Unspecified mood [affective] disorder: Secondary | ICD-10-CM | POA: Diagnosis present

## 2019-08-03 DIAGNOSIS — I6523 Occlusion and stenosis of bilateral carotid arteries: Secondary | ICD-10-CM | POA: Diagnosis present

## 2019-08-03 DIAGNOSIS — F418 Other specified anxiety disorders: Secondary | ICD-10-CM | POA: Diagnosis present

## 2019-08-03 DIAGNOSIS — F17200 Nicotine dependence, unspecified, uncomplicated: Secondary | ICD-10-CM | POA: Diagnosis present

## 2019-08-03 DIAGNOSIS — F419 Anxiety disorder, unspecified: Secondary | ICD-10-CM | POA: Diagnosis not present

## 2019-08-03 DIAGNOSIS — R253 Fasciculation: Secondary | ICD-10-CM | POA: Diagnosis not present

## 2019-08-03 DIAGNOSIS — G8194 Hemiplegia, unspecified affecting left nondominant side: Secondary | ICD-10-CM | POA: Diagnosis not present

## 2019-08-03 DIAGNOSIS — Z87440 Personal history of urinary (tract) infections: Secondary | ICD-10-CM

## 2019-08-03 DIAGNOSIS — T450X5A Adverse effect of antiallergic and antiemetic drugs, initial encounter: Secondary | ICD-10-CM | POA: Diagnosis not present

## 2019-08-03 DIAGNOSIS — N179 Acute kidney failure, unspecified: Secondary | ICD-10-CM | POA: Diagnosis present

## 2019-08-03 DIAGNOSIS — G40909 Epilepsy, unspecified, not intractable, without status epilepticus: Secondary | ICD-10-CM | POA: Diagnosis present

## 2019-08-03 DIAGNOSIS — Z7982 Long term (current) use of aspirin: Secondary | ICD-10-CM

## 2019-08-03 DIAGNOSIS — Z7951 Long term (current) use of inhaled steroids: Secondary | ICD-10-CM

## 2019-08-03 DIAGNOSIS — Z85038 Personal history of other malignant neoplasm of large intestine: Secondary | ICD-10-CM

## 2019-08-03 DIAGNOSIS — Z79899 Other long term (current) drug therapy: Secondary | ICD-10-CM

## 2019-08-03 LAB — COMPREHENSIVE METABOLIC PANEL
ALT: 14 U/L (ref 0–44)
AST: 16 U/L (ref 15–41)
Albumin: 4 g/dL (ref 3.5–5.0)
Alkaline Phosphatase: 62 U/L (ref 38–126)
Anion gap: 15 (ref 5–15)
BUN: 9 mg/dL (ref 8–23)
CO2: 29 mmol/L (ref 22–32)
Calcium: 9.1 mg/dL (ref 8.9–10.3)
Chloride: 99 mmol/L (ref 98–111)
Creatinine, Ser: 1.25 mg/dL — ABNORMAL HIGH (ref 0.61–1.24)
GFR calc Af Amer: >60 mL/min (ref >60)
GFR calc non Af Amer: 58 mL/min — ABNORMAL LOW (ref >60)
Glucose, Bld: 106 mg/dL — ABNORMAL HIGH (ref 70–99)
Potassium: 2.8 mmol/L — ABNORMAL LOW (ref 3.5–5.1)
Sodium: 143 mmol/L (ref 135–145)
Total Bilirubin: 0.8 mg/dL (ref 0.3–1.2)
Total Protein: 7.4 g/dL (ref 6.5–8.1)

## 2019-08-03 LAB — CBC WITH DIFFERENTIAL/PLATELET
Abs Immature Granulocytes: 0.02 10*3/uL (ref 0.00–0.07)
Basophils Absolute: 0 10*3/uL (ref 0.0–0.1)
Basophils Relative: 0 %
Eosinophils Absolute: 0 10*3/uL (ref 0.0–0.5)
Eosinophils Relative: 0 %
HCT: 42.2 % (ref 39.0–52.0)
Hemoglobin: 13.7 g/dL (ref 13.0–17.0)
Immature Granulocytes: 0 %
Lymphocytes Relative: 14 %
Lymphs Abs: 1 10*3/uL (ref 0.7–4.0)
MCH: 29.9 pg (ref 26.0–34.0)
MCHC: 32.5 g/dL (ref 30.0–36.0)
MCV: 92.1 fL (ref 80.0–100.0)
Monocytes Absolute: 0.5 10*3/uL (ref 0.1–1.0)
Monocytes Relative: 7 %
Neutro Abs: 5.4 10*3/uL (ref 1.7–7.7)
Neutrophils Relative %: 79 %
Platelets: 195 10*3/uL (ref 150–400)
RBC: 4.58 MIL/uL (ref 4.22–5.81)
RDW: 13.2 % (ref 11.5–15.5)
WBC: 6.9 10*3/uL (ref 4.0–10.5)
nRBC: 0 % (ref 0.0–0.2)

## 2019-08-03 LAB — TROPONIN I (HIGH SENSITIVITY): Troponin I (High Sensitivity): 12 ng/L (ref <18)

## 2019-08-03 LAB — URINALYSIS, ROUTINE W REFLEX MICROSCOPIC
Bilirubin Urine: NEGATIVE
Glucose, UA: NEGATIVE mg/dL
Ketones, ur: NEGATIVE mg/dL
Nitrite: NEGATIVE
Protein, ur: 30 mg/dL — AB
Specific Gravity, Urine: 1.016 (ref 1.005–1.030)
WBC, UA: >50 WBC/hpf — ABNORMAL HIGH (ref 0–5)
pH: 6 (ref 5.0–8.0)

## 2019-08-03 LAB — SARS CORONAVIRUS 2 (TAT 6-24 HRS): SARS Coronavirus 2: NEGATIVE

## 2019-08-03 LAB — MAGNESIUM: Magnesium: 2.1 mg/dL (ref 1.7–2.4)

## 2019-08-03 LAB — LACTIC ACID, PLASMA: Lactic Acid, Venous: 0.9 mmol/L (ref 0.5–1.9)

## 2019-08-03 LAB — LIPASE, BLOOD: Lipase: 22 U/L (ref 11–51)

## 2019-08-03 LAB — PHOSPHORUS: Phosphorus: 3.5 mg/dL (ref 2.5–4.6)

## 2019-08-03 MED ORDER — CHLORHEXIDINE GLUCONATE 0.12 % MT SOLN
15.0000 mL | Freq: Two times a day (BID) | OROMUCOSAL | Status: DC
  Administered 2019-08-04 – 2019-08-14 (×21): 15 mL via OROMUCOSAL
  Filled 2019-08-03 (×21): qty 15

## 2019-08-03 MED ORDER — LEVETIRACETAM IN NACL 500 MG/100ML IV SOLN: 500.0000 mg | Freq: Two times a day (BID) | INTRAVENOUS | Status: DC

## 2019-08-03 MED ORDER — HEPARIN SODIUM (PORCINE) 5000 UNIT/ML IJ SOLN
5000.0000 [IU] | Freq: Three times a day (TID) | INTRAMUSCULAR | Status: DC
  Administered 2019-08-03 – 2019-08-13 (×30): 5000 [IU] via SUBCUTANEOUS
  Filled 2019-08-03 (×30): qty 1

## 2019-08-03 MED ORDER — IOHEXOL 300 MG/ML  SOLN
100.0000 mL | Freq: Once | INTRAMUSCULAR | Status: AC | PRN
  Administered 2019-08-03: 100 mL via INTRAVENOUS

## 2019-08-03 MED ORDER — LORAZEPAM 2 MG/ML IJ SOLN
0.5000 mg | Freq: Once | INTRAMUSCULAR | Status: AC
  Administered 2019-08-03: 0.5 mg via INTRAVENOUS
  Filled 2019-08-03: qty 1

## 2019-08-03 MED ORDER — ALPRAZOLAM 0.5 MG PO TABS
0.5000 mg | ORAL_TABLET | Freq: Three times a day (TID) | ORAL | Status: DC
  Administered 2019-08-03 – 2019-08-06 (×8): 0.5 mg via ORAL
  Filled 2019-08-03 (×8): qty 1

## 2019-08-03 MED ORDER — POTASSIUM CHLORIDE 10 MEQ/100ML IV SOLN
10.0000 meq | INTRAVENOUS | Status: AC
  Administered 2019-08-03 (×3): 10 meq via INTRAVENOUS
  Filled 2019-08-03: qty 100

## 2019-08-03 MED ORDER — ORAL CARE MOUTH RINSE
15.0000 mL | Freq: Two times a day (BID) | OROMUCOSAL | Status: DC
  Administered 2019-08-04 – 2019-08-14 (×17): 15 mL via OROMUCOSAL

## 2019-08-03 MED ORDER — POTASSIUM CHLORIDE 10 MEQ/100ML IV SOLN
10.0000 meq | Freq: Once | INTRAVENOUS | Status: AC
  Administered 2019-08-03: 10 meq via INTRAVENOUS

## 2019-08-03 MED ORDER — PHENOL 1.4 % MT LIQD
1.0000 | OROMUCOSAL | Status: DC | PRN
  Administered 2019-08-04: 1 via OROMUCOSAL
  Filled 2019-08-03 (×2): qty 177

## 2019-08-03 MED ORDER — SODIUM CHLORIDE 0.9 % IV SOLN
1.0000 g | Freq: Once | INTRAVENOUS | Status: AC
  Administered 2019-08-03: 1 g via INTRAVENOUS
  Filled 2019-08-03: qty 10

## 2019-08-03 MED ORDER — ONDANSETRON HCL 4 MG/2ML IJ SOLN: 4.0000 mg | Freq: Once | INTRAMUSCULAR | Status: DC

## 2019-08-03 MED ORDER — POTASSIUM CHLORIDE 10 MEQ/100ML IV SOLN
10.0000 meq | INTRAVENOUS | Status: AC
  Filled 2019-08-03 (×3): qty 100

## 2019-08-03 MED ORDER — CEFTRIAXONE SODIUM 1 G IJ SOLR
1.0000 g | INTRAMUSCULAR | Status: DC
Start: 2019-08-04 — End: 2019-08-05
  Administered 2019-08-04: 1 g via INTRAVENOUS
  Filled 2019-08-03: qty 10
  Filled 2019-08-03: qty 1

## 2019-08-03 MED ORDER — KCL IN DEXTROSE-NACL 40-5-0.9 MEQ/L-%-% IV SOLN
INTRAVENOUS | Status: DC
  Administered 2019-08-03 – 2019-08-05 (×4): via INTRAVENOUS
  Filled 2019-08-03 (×8): qty 1000

## 2019-08-03 MED ORDER — FENTANYL CITRATE (PF) 100 MCG/2ML IJ SOLN: 50.0000 ug | Freq: Once | INTRAMUSCULAR | Status: DC

## 2019-08-03 NOTE — ED Notes (Signed)
IV team unsuccessful at getting IV- will notify MD

## 2019-08-03 NOTE — ED Provider Notes (Signed)
[facility] Emergency Department Provider Note  ____________________________________________   First MD Initiated Contact with Patient 08/03/19 [phone]     (approximate)  I have reviewed the triage vital signs and the nursing notes.   HISTORY  Chief Complaint Abdominal Pain    HPI Calvin [name] is a 68 y.o. 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  Patient denies ever needing surgical intervention previously.  He states that yesterday he developed severe abdominal pain that was constant, nothing made it better, nothing made it worse.  He then developed some bilious vomiting today about 2 cups full.  He denies any fevers, cough, shortness of breath, chest pain.  He does have some baseline left-sided deficits from a prior stroke.   Past Medical History:  Diagnosis Date  . Alcohol abuse    drinks on weekend  . Anemia   . Anxiety   . Arthritis   . Cancer ([place])    colon,throat  . COPD (chronic obstructive pulmonary disease) ([place])   . Coronary artery disease   . Depression   . Gout   . Hypertension   . Myocardial infarction ([place])   . Neuromuscular disorder ([place])   . Seizures ([place])    last 6 months ago  . Stroke [facility])    multiple  left side weakness  . Tremors of nervous system     Patient Active Problem List   Diagnosis Date Noted  . Ogilvie's syndrome   . Abdominal pain 04/03/2019  . Left-sided weakness 09/30/2017  . Seizures ([place]) 09/30/2017  . HTN (hypertension) 09/30/2017  . CAD (coronary artery disease) 09/30/2017  . COPD (chronic obstructive pulmonary disease) ([place]) 09/30/2017  . Depression with anxiety 09/30/2017    Past Surgical History:  Procedure Laterality Date  . CARPAL TUNNEL RELEASE Left 10/19/2015   Procedure: CARPAL TUNNEL RELEASE;  Surgeon: Hessie Knows, MD;  Location: [facility] ORS;  Service: Orthopedics;  Laterality: Left;  .  COLON SURGERY    . JOINT REPLACEMENT     left partial hip   . THROAT SURGERY  2013   cancer    Prior to Admission medications   Medication Sig Start Date End Date Taking? Authorizing Provider  acetaminophen (TYLENOL) 500 MG tablet Take 500 mg by mouth 3 (three) times daily.    [provider]  acetaminophen (TYLENOL) 650 MG CR tablet Take 1,300 mg by mouth every 12 (twelve) hours.    [provider]  ALPRAZolam Duanne Moron) 0.5 MG tablet Take 1 tablet (0.5 mg total) by mouth 3 (three) times daily. 10/02/17   Max Sane, MD  Alum & Mag Hydroxide-Simeth (MYLANTA PO) Take 30 mLs by mouth every 12 (twelve) hours as needed.    [provider]  aspirin 81 MG tablet Take 81 mg by mouth daily.    [provider]  Dextromethorphan-Benzocaine (CEPACOL SORE THROAT & COUGH) 5-7.5 MG LOZG Use as directed 1 lozenge in the mouth or throat every 2 (two) hours as needed (sore throat).    [provider]  Dextromethorphan-guaiFENesin 10-100 MG/5ML liquid Take 5 mLs by mouth every 4 (four) hours as needed.    [provider]  ergocalciferol (VITAMIN D2) 50000 units capsule Take 50,000 Units by mouth every 30 (thirty) days.    [provider]  furosemide (LASIX) 20 MG tablet Take 20 mg by mouth daily.    [provider]  gabapentin (NEURONTIN) 400 MG capsule  Take 2 capsules (800 mg total) by mouth 3 (three) times daily. 10/02/17   Max Sane, MD  HYDROcodone-acetaminophen (NORCO) 7.5-325 MG tablet Take 1 tablet by mouth every 8 (eight) hours as needed for moderate pain.    [provider]  lactulose (CHRONULAC) 10 GM/15ML solution Take 20 g by mouth daily.    [provider]  levETIRAcetam (KEPPRA) 500 MG tablet Take 500 mg by mouth 2 (two) times daily.     [provider]  lisinopril (PRINIVIL,ZESTRIL) 5 MG tablet Take 7.5 mg by mouth daily.    [provider]  loratadine (CLARITIN) 10 MG tablet Take 10 mg by  mouth daily.    [provider]  nicotine polacrilex (NICORETTE) 2 MG gum Take 2 mg by mouth every 4 (four) hours as needed for smoking cessation.    [provider]  nitroGLYCERIN (NITROSTAT) 0.4 MG SL tablet Place 0.4 mg under the tongue every 5 (five) minutes as needed for chest pain.    [provider]  nystatin (NYSTATIN) powder Apply topically 2 (two) times daily. Groin rash    [provider]  ondansetron (ZOFRAN) 4 MG tablet Take 4 mg by mouth every 8 (eight) hours as needed for nausea or vomiting.    [provider]  pantoprazole (PROTONIX) 20 MG tablet Take 20 mg by mouth daily.    [provider]  polyethylene glycol (MIRALAX / GLYCOLAX) packet Take 17 g by mouth daily.     [provider]  sertraline (ZOLOFT) 25 MG tablet Take 25 mg by mouth daily.    [provider]  simvastatin (ZOCOR) 10 MG tablet Take 10 mg by mouth at bedtime.     [provider]  tiotropium (SPIRIVA) 18 MCG inhalation capsule Place 18 mcg into inhaler and inhale daily.    [provider]  trolamine salicylate (ASPERCREME) 10 % cream Apply 1 application topically 3 (three) times daily. To neck    [provider]    Allergies Patient has no known allergies.  Family History  Problem Relation Age of Onset  . Cancer Mother   . Hypertension Father   . Leukemia Brother     Social History Social History   Tobacco Use  . Smoking status: Current Every Day Smoker  . Smokeless tobacco: Never Used  Substance Use Topics  . Alcohol use: Yes    Comment: weekends  . Drug use: No      Review of Systems Constitutional: No fever/chills Eyes: No visual changes. ENT: No sore throat. Cardiovascular: Denies chest pain. Respiratory: Denies shortness of breath. Gastrointestinal: Positive abdominal pain, vomiting, distention Genitourinary: Negative for dysuria. Musculoskeletal: Negative for back pain. Skin: Negative  for rash. Neurological: Negative for headaches, focal weakness or numbness. All other ROS negative ____________________________________________   PHYSICAL EXAM:  VITAL SIGNS: Pulse 98, resp. rate 15, height 5\' 11"  (1.803 m), weight 98.9 kg, SpO2 95 %.   Constitutional: Alert and oriented. Well appearing and in no acute distress. Eyes: Conjunctivae are normal. EOMI. Head: Atraumatic. Nose: No congestion/rhinnorhea. Mouth/Throat: Mucous membranes are moist.   Neck: No stridor. Trachea Midline. FROM Cardiovascular: Normal rate, regular rhythm. Grossly normal heart sounds.  Good peripheral circulation. Respiratory: Normal respiratory effort.  No retractions. Lungs CTAB. Gastrointestinal: Abdomen is distended with some generalized tenderness.  No rebound or guarding. Musculoskeletal: No lower extremity tenderness nor edema.  No joint effusions. Neurologic:  Normal speech and language. No gross focal neurologic deficits are appreciated.  Skin:  Skin is warm, dry and intact. No rash noted. Psychiatric: Mood and affect are normal. Speech and behavior are normal. GU: Deferred   ____________________________________________   LABS (all labs ordered are listed, but only abnormal results are displayed)  Labs Reviewed  COMPREHENSIVE METABOLIC PANEL - Abnormal; Notable for the following components:      Result Value   Potassium 2.8 (*)    Glucose, Bld 106 (*)    Creatinine, Ser 1.25 (*)    GFR calc non Af Amer 58 (*)    All other components within normal limits  URINALYSIS, ROUTINE W REFLEX MICROSCOPIC - Abnormal; Notable for the following components:   Color, Urine YELLOW (*)    APPearance HAZY (*)    Hgb urine dipstick SMALL (*)    Protein, ur 30 (*)    Leukocytes,Ua LARGE (*)    WBC, UA >50 (*)    Bacteria, UA FEW (*)    All other components within normal limits  CBC WITH DIFFERENTIAL/PLATELET  LIPASE, BLOOD  LACTIC ACID, PLASMA  MAGNESIUM  LACTIC ACID, PLASMA  TROPONIN I  (HIGH SENSITIVITY)  TROPONIN I (HIGH SENSITIVITY)   ____________________________________________   ED ECG REPORT I, Vanessa [place], the attending physician, personally viewed and interpreted this ECG.  EKG is sinus tachycardia rate of 106, no ST elevations, no T wave inversions, QTC of 627 ____________________________________________  RADIOLOGY   Official radiology report(s): Ct Abdomen Pelvis W Contrast  Result Date: 08/03/2019 CLINICAL DATA:  Abdominal pain, distention, nausea, vomiting and diarrhea. EXAM: CT ABDOMEN AND PELVIS WITH CONTRAST TECHNIQUE: Multidetector CT imaging of the abdomen and pelvis was performed using the standard protocol following bolus administration of intravenous contrast. CONTRAST:  147mL OMNIPAQUE IOHEXOL 300 MG/ML  SOLN COMPARISON:  04/02/2019 FINDINGS: Lower chest: Stable bibasilar scarring. Hepatobiliary: No focal liver abnormality is seen. No gallstones, gallbladder wall thickening, or biliary dilatation. Pancreas: Unremarkable. No pancreatic ductal dilatation or surrounding inflammatory changes. Spleen: Normal in size without focal abnormality. Adrenals/Urinary Tract: Adrenal glands demonstrates stable bilateral nodules. Stable cortical scarring of both kidneys. No renal calculi, focal lesion, or hydronephrosis. Bladder is unremarkable. Stomach/Bowel: The stomach is moderately dilated and contains an air-fluid level. There is some mild dilatation of proximal jejunum. The rest of the small bowel is decompressed. Stable gaseous distention of predominantly the transverse colon without obstructing lesion identified. A trace amount of free fluid is seen adjacent to the proximal descending colon. No free air. Vascular/Lymphatic: No significant vascular findings are present. No enlarged abdominal or pelvic lymph nodes. Reproductive: Prostate is unremarkable. Other: No hernias or focal abscess identified. Musculoskeletal: No acute or significant osseous findings.  IMPRESSION: 1. Stable gaseous distention of predominantly the transverse colon without obstructing lesion identified by CT. A trace amount of free fluid is seen adjacent to the proximal descending colon. 2. Gastric distension and some distention of proximal small bowel. Findings are suggestive of ileus or enteritis. 3. Stable bilateral adrenal nodules. 4. Stable bilateral renal cortical scarring. Electronically Signed   By: Aletta Edouard M.D.   On: 08/03/2019 15:04    ____________________________________________   PROCEDURES  Procedure(s) performed (including Critical Care):  Procedures   ____________________________________________   INITIAL IMPRESSION / ASSESSMENT AND PLAN / ED COURSE  Calvin [name] was evaluated in Emergency Department on 08/03/2019 for the symptoms described in the history of present illness. He was evaluated in the context of the global COVID-19 pandemic, which necessitated consideration that the patient might be at risk for infection with the SARS-CoV-2  virus that causes COVID-19. Institutional protocols and algorithms that pertain to the evaluation of patients at risk for COVID-19 are in a state of rapid change based on information released by regulatory bodies including the CDC and federal and state organizations. These policies and algorithms were followed during the patient's care in the ED.    Patient presents with concerns for recurrent pseudoobstruction based upon his examination.  However will get CT scan to make sure there is no transition point.  No history of surgical interventions to put him at high risk for adhesions.  Will get labs to evaluate for AKI, electrolyte abnormalities, urine to evaluate for UTI.  Will get EKG and cardiac markers to evaluate for ACS although my suspicion is less likely.  CT scan consistent with similar images as he had in July.  Given patient has been vomiting NG tube was ordered.  Unable to give Zofran due to elevated QTC.   Potassium was low so we will give 30 of IV K continue to monitor.  Magnesium was normal.  Urine looks concerning for UTI so a culture was sent and given a dose of ceftriaxone.  Patient was admitted to the hospital team     ____________________________________________   FINAL CLINICAL IMPRESSION(S) / ED DIAGNOSES   Final diagnoses:  Ileus ([place])  Acute cystitis without hematuria  Hypokalemia      MEDICATIONS GIVEN DURING THIS VISIT:  Medications  fentaNYL (SUBLIMAZE) injection 50 mcg (has no administration in time range)  potassium chloride 10 mEq in 100 mL IVPB (has no administration in time range)  cefTRIAXone (ROCEPHIN) 1 g in sodium chloride 0.9 % 100 mL IVPB (has no administration in time range)  iohexol (OMNIPAQUE) 300 MG/ML solution 100 mL (100 mLs Intravenous Contrast Given 08/03/19 1429)     ED Discharge Orders    None       Note:  This document was prepared using Dragon voice recognition software and may include unintentional dictation errors.   Vanessa [place], MD 08/03/19 [phone]

## 2019-08-03 NOTE — ED Notes (Signed)
Report received from Ariel, RN 

## 2019-08-03 NOTE — ED Notes (Signed)
Notified CT of IV

## 2019-08-03 NOTE — ED Triage Notes (Signed)
Pt to ED with c/o of Abdominal Pain that started today while going to the bathroom. Per staff at [facility] Pt's abdomen is distended. Pt also has N/V/D. Pt admitted approx 2 mos ago for bowel obstruction.

## 2019-08-03 NOTE — ED Notes (Signed)
Patient's oxygen saturation decreased to 87% on RA. Patient placed on 2L [place]. MD at bedside/MD aware.

## 2019-08-03 NOTE — ED Notes (Signed)
Levada Dy, RN at bedside to attempt EJ

## 2019-08-03 NOTE — ED Notes (Signed)
Pt in CT.

## 2019-08-03 NOTE — H&P (Addendum)
History and Physical:    Calvin [name]   [id number] DOB: Jul 29, 1951 DOA: 08/03/2019  Referring MD/provider: Dr. Jari Pigg PCP: Lynnell Jude, MD   Patient coming from: Nursing home  Chief Complaint: Vomiting  History of Present Illness:   Calvin [name] is an 68 y.o. male with medical history significant for Olgivie syndrome, alcohol abuse, anxiety, seizure disorder, COPD, depression, hypertension, CAD, stroke, presented to the hospital with nausea, vomiting, abdominal pain and abdominal distention.  He said he started vomiting this morning and he has been vomiting all day.  Vomitus is nonbloody.  He noticed that his abdomen was distended yesterday.  He said he had 1 episode of watery stools this morning.  He has not had any fever or chills.  He said he has had "always had UTIs".  He says sometimes he applies some kind of powder to his penis.  He reports increased frequency of micturition but no other urinary symptoms.  When pressed for more details, he just kept saying "I want this tube out of my nose".  ED Course:  The patient was found to have distention of the stomach and proximal small bowel with findings suggestive of ileus or enteritis per CT scan report.  NGT was placed in the ED because of intractable vomiting.  He was given IV potassium for hypokalemia.  EKG showed prolonged QTC.  ROS:   ROS all other systems reviewed were negative  Past Medical History:   Past Medical History:  Diagnosis Date  . Alcohol abuse    drinks on weekend  . Anemia   . Anxiety   . Arthritis   . Cancer ([place])    colon,throat  . COPD (chronic obstructive pulmonary disease) ([place])   . Coronary artery disease   . Depression   . Gout   . Hypertension   . Myocardial infarction ([place])   . Neuromuscular disorder ([place])   . Seizures ([place])    last 6 months ago  . Stroke [facility])    multiple  left side weakness  . Tremors of nervous system     Past Surgical History:   Past Surgical History:   Procedure Laterality Date  . CARPAL TUNNEL RELEASE Left 10/19/2015   Procedure: CARPAL TUNNEL RELEASE;  Surgeon: Hessie Knows, MD;  Location: [facility] ORS;  Service: Orthopedics;  Laterality: Left;  . COLON SURGERY    . JOINT REPLACEMENT     left partial hip   . THROAT SURGERY  2013   cancer    Social History:   Social History   Socioeconomic History  . Marital status: Single    Spouse name: Not on file  . Number of children: Not on file  . Years of education: Not on file  . Highest education level: Not on file  Occupational History  . Not on file  Social Needs  . Financial resource strain: Not on file  . Food insecurity    Worry: Not on file    Inability: Not on file  . Transportation needs    Medical: Not on file    Non-medical: Not on file  Tobacco Use  . Smoking status: Current Every Day Smoker  . Smokeless tobacco: Never Used  Substance and Sexual Activity  . Alcohol use: Yes    Comment: weekends  . Drug use: No  . Sexual activity: Not on file  Lifestyle  . Physical activity    Days per week: Not on file    Minutes per session: Not  on file  . Stress: Not on file  Relationships  . Social Herbalist on phone: Not on file    Gets together: Not on file    Attends religious service: Not on file    Active member of club or organization: Not on file    Attends meetings of clubs or organizations: Not on file    Relationship status: Not on file  . Intimate partner violence    Fear of current or ex partner: Not on file    Emotionally abused: Not on file    Physically abused: Not on file    Forced sexual activity: Not on file  Other Topics Concern  . Not on file  Social History Narrative  . Not on file    Allergies   Patient has no known allergies.  Family history:   Family History  Problem Relation Age of Onset  . Cancer Mother   . Hypertension Father   . Leukemia Brother     Current Medications:   Prior to Admission medications    Medication Sig Start Date End Date Taking? Authorizing Provider  acetaminophen (TYLENOL) 500 MG tablet Take 500 mg by mouth 3 (three) times daily.    [provider]  acetaminophen (TYLENOL) 650 MG CR tablet Take 1,300 mg by mouth every 12 (twelve) hours.    [provider]  ALPRAZolam Duanne Moron) 0.5 MG tablet Take 1 tablet (0.5 mg total) by mouth 3 (three) times daily. 10/02/17   Max Sane, MD  Alum & Mag Hydroxide-Simeth (MYLANTA PO) Take 30 mLs by mouth every 12 (twelve) hours as needed.    [provider]  aspirin 81 MG tablet Take 81 mg by mouth daily.    [provider]  Dextromethorphan-Benzocaine (CEPACOL SORE THROAT & COUGH) 5-7.5 MG LOZG Use as directed 1 lozenge in the mouth or throat every 2 (two) hours as needed (sore throat).    [provider]  Dextromethorphan-guaiFENesin 10-100 MG/5ML liquid Take 5 mLs by mouth every 4 (four) hours as needed.    [provider]  ergocalciferol (VITAMIN D2) 50000 units capsule Take 50,000 Units by mouth every 30 (thirty) days.    [provider]  furosemide (LASIX) 20 MG tablet Take 20 mg by mouth daily.    [provider]  gabapentin (NEURONTIN) 400 MG capsule Take 2 capsules (800 mg total) by mouth 3 (three) times daily. 10/02/17   Max Sane, MD  HYDROcodone-acetaminophen (NORCO) 7.5-325 MG tablet Take 1 tablet by mouth every 8 (eight) hours as needed for moderate pain.    [provider]  lactulose (CHRONULAC) 10 GM/15ML solution Take 20 g by mouth daily.    [provider]  levETIRAcetam (KEPPRA) 500 MG tablet Take 500 mg by mouth 2 (two) times daily.     [provider]  lisinopril (PRINIVIL,ZESTRIL) 5 MG tablet Take 7.5 mg by mouth daily.    [provider]  loratadine (CLARITIN) 10 MG tablet Take 10 mg by mouth daily.    [provider]  nicotine polacrilex (NICORETTE) 2 MG gum Take 2 mg by mouth every 4 (four) hours as needed  for smoking cessation.    [provider]  nitroGLYCERIN (NITROSTAT) 0.4 MG SL tablet Place 0.4 mg under the tongue every 5 (five) minutes as needed for chest pain.    [provider]  nystatin (NYSTATIN) powder Apply topically 2 (two) times daily. Groin rash    [provider]  ondansetron (  ZOFRAN) 4 MG tablet Take 4 mg by mouth every 8 (eight) hours as needed for nausea or vomiting.    [provider]  pantoprazole (PROTONIX) 20 MG tablet Take 20 mg by mouth daily.    [provider]  polyethylene glycol (MIRALAX / GLYCOLAX) packet Take 17 g by mouth daily.     [provider]  sertraline (ZOLOFT) 25 MG tablet Take 25 mg by mouth daily.    [provider]  simvastatin (ZOCOR) 10 MG tablet Take 10 mg by mouth at bedtime.     [provider]  tiotropium (SPIRIVA) 18 MCG inhalation capsule Place 18 mcg into inhaler and inhale daily.    [provider]  trolamine salicylate (ASPERCREME) 10 % cream Apply 1 application topically 3 (three) times daily. To neck    [provider]    Physical Exam:   Vitals:   08/03/19 1251 08/03/19 1300 08/03/19 1330 08/03/19 1400  Pulse: 96 97 98 98  Resp: 15 15 17 15   SpO2: 99% 95% 96% 95%  Weight:      Height:         Physical Exam: Pulse 98, resp. rate 15, height 5\' 11"  (1.803 m), weight 98.9 kg, SpO2 95 %. Gen: No acute distress. Head: Normocephalic, atraumatic. Eyes: Pupils equal, round and reactive to light. Extraocular movements intact.  Sclerae nonicteric.  Mouth/Nose: Oropharynx reveals clear. NG tube connected to low wall suction Neck: Supple, no thyromegaly, no lymphadenopathy, no jugular venous distention. Chest: Lungs are clear to auscultation with good air movement. No rales, rhonchi or wheezes. CV: Heart sounds are regular with an S1, S2. No murmurs, rubs or gallops.  Abdomen: Distended, soft, tenderness in the periumbilical area with normal active  bowel sounds. No palpable masses. Extremities: Extremities are without clubbing, or cyanosis. No edema. Pedal pulses 2+. Skin: Warm and dry. No rashes Neuro: Alert and oriented times 3; grossly nonfocal. Psych: Mood and affect normal.   Data Review:    Labs: Basic Metabolic Panel: Recent Labs  Lab 08/03/19 1009  NA 143  K 2.8*  CL 99  CO2 29  GLUCOSE 106*  BUN 9  CREATININE 1.25*  CALCIUM 9.1  MG 2.1  PHOS 3.5   Liver Function Tests: Recent Labs  Lab 08/03/19 1009  AST 16  ALT 14  ALKPHOS 62  BILITOT 0.8  PROT 7.4  ALBUMIN 4.0   Recent Labs  Lab 08/03/19 1009  LIPASE 22   No results for input(s): AMMONIA in the last 168 hours. CBC: Recent Labs  Lab 08/03/19 1009  WBC 6.9  NEUTROABS 5.4  HGB 13.7  HCT 42.2  MCV 92.1  PLT 195   Cardiac Enzymes: No results for input(s): CKTOTAL, CKMB, CKMBINDEX, TROPONINI in the last 168 hours.  BNP (last 3 results) No results for input(s): PROBNP in the last 8760 hours. CBG: No results for input(s): GLUCAP in the last 168 hours.  Urinalysis    Component Value Date/Time   COLORURINE YELLOW (A) 08/03/2019 1009   APPEARANCEUR HAZY (A) 08/03/2019 1009   APPEARANCEUR Clear 08/14/2014 0933   LABSPEC 1.016 08/03/2019 1009   LABSPEC 1.015 08/14/2014 0933   PHURINE 6.0 08/03/2019 1009   GLUCOSEU NEGATIVE 08/03/2019 1009   GLUCOSEU Negative 08/14/2014 0933   HGBUR SMALL (A) 08/03/2019 1009   BILIRUBINUR NEGATIVE 08/03/2019 1009   BILIRUBINUR Negative 08/14/2014 0933   KETONESUR NEGATIVE 08/03/2019 1009   PROTEINUR 30 (A) 08/03/2019 1009   UROBILINOGEN 0.2 06/12/2010 0103  NITRITE NEGATIVE 08/03/2019 1009   LEUKOCYTESUR LARGE (A) 08/03/2019 1009   LEUKOCYTESUR Negative 08/14/2014 0933      Radiographic Studies: Dg Abdomen 1 View  Result Date: 08/03/2019 CLINICAL DATA:  Evaluate NG tube placement EXAM: ABDOMEN - 1 VIEW COMPARISON:  KUB April 08, 2019.  CT scan August 03, 2019. FINDINGS: The side port of  the NG tube is probably at or just below the GE junction. The distal tip is in the region of the gastric body. Dilated loops of bowel are again identified. IMPRESSION: The side port of the NG tube is probably at or just below the GE junction. The distal tip is in the region of the gastric body. Electronically Signed   By: Dorise Bullion III M.D   On: 08/03/2019 16:39   Ct Abdomen Pelvis W Contrast  Result Date: 08/03/2019 CLINICAL DATA:  Abdominal pain, distention, nausea, vomiting and diarrhea. EXAM: CT ABDOMEN AND PELVIS WITH CONTRAST TECHNIQUE: Multidetector CT imaging of the abdomen and pelvis was performed using the standard protocol following bolus administration of intravenous contrast. CONTRAST:  154mL OMNIPAQUE IOHEXOL 300 MG/ML  SOLN COMPARISON:  04/02/2019 FINDINGS: Lower chest: Stable bibasilar scarring. Hepatobiliary: No focal liver abnormality is seen. No gallstones, gallbladder wall thickening, or biliary dilatation. Pancreas: Unremarkable. No pancreatic ductal dilatation or surrounding inflammatory changes. Spleen: Normal in size without focal abnormality. Adrenals/Urinary Tract: Adrenal glands demonstrates stable bilateral nodules. Stable cortical scarring of both kidneys. No renal calculi, focal lesion, or hydronephrosis. Bladder is unremarkable. Stomach/Bowel: The stomach is moderately dilated and contains an air-fluid level. There is some mild dilatation of proximal jejunum. The rest of the small bowel is decompressed. Stable gaseous distention of predominantly the transverse colon without obstructing lesion identified. A trace amount of free fluid is seen adjacent to the proximal descending colon. No free air. Vascular/Lymphatic: No significant vascular findings are present. No enlarged abdominal or pelvic lymph nodes. Reproductive: Prostate is unremarkable. Other: No hernias or focal abscess identified. Musculoskeletal: No acute or significant osseous findings. IMPRESSION: 1. Stable  gaseous distention of predominantly the transverse colon without obstructing lesion identified by CT. A trace amount of free fluid is seen adjacent to the proximal descending colon. 2. Gastric distension and some distention of proximal small bowel. Findings are suggestive of ileus or enteritis. 3. Stable bilateral adrenal nodules. 4. Stable bilateral renal cortical scarring. Electronically Signed   By: Aletta Edouard M.D.   On: 08/03/2019 15:04    EKG: Independently reviewed.  Normal sinus rhythm, prolonged QTc interval   Assessment/Plan:   Active Problems:   Acute UTI   Body mass index is 30.4 kg/m.   Ileus/vomiting, abdominal pain and distention: Admit to MedSurg unit.  Will make n.p.o.  IV fluids for hydration.  Continue gastric decompression with NG tube.  Consult general surgery to assist with management.  Acute UTI: Treat with empiric IV Rocephin.  Follow-up urine culture.  Hypokalemia: Replete potassium  Prolonged QTc interval on EKG (627): Probably due to hypokalemia.  Repeat EKG tomorrow.  Seizure disorder: Hold Keppra for now because of prolonged QTc interval  History of stroke: Hold aspirin and statin because of vomiting  COPD: Stable.  Bronchodilators as needed.  Other information:   DVT prophylaxis: Heparin Code Status: Full code Family Communication: Plan discussed with the patient Disposition Plan: Possible discharge to the nursing home in 2 to 3 days Consults called: Consulted general surgeon Admission status: Inpatient   The medical decision making is of moderate complexity, therefore this is  a level 2 visit.  Time spent 50 minutes  [place] [facility]   How to contact the [facility] Attending or Consulting provider [place] or covering provider during after hours [place], for this patient?   1. Check the care team in [facility] and look for a) attending/consulting [facility] provider listed and b) the [facility] team listed 2. Log into [email or web address] and use [facility]'s universal password to access. If you do not have the password, please contact the hospital operator. 3. Locate the [facility] provider you are looking for under [facility] and page to a number that you can be directly reached. 4. If you still have difficulty reaching the provider, please page the [facility] (Director on Call) for the Hospitalists listed on amion for assistance.  08/03/2019, 4:50 PM

## 2019-08-03 NOTE — ED Notes (Addendum)
Dr Jari Pigg at bedside to attempt IV- unsuccessful x2- pt repositioned in bed for comfort

## 2019-08-03 NOTE — ED Notes (Signed)
Admitting provider at bedside.

## 2019-08-03 NOTE — ED Notes (Signed)
IV team at bedside 

## 2019-08-03 NOTE — Consult Note (Addendum)
Subjective:   CC: N/V  HPI:  Calvin [name] is a 68 y.o. male who was consulted by [facility] for issue above.  Symptoms were first noted 1 days ago. Somewhat limited historian.  He states he started feeling not so well the past couple days, started vomiting a day ago.  Unsure when abdominal pain started.    States he has been having daily BMs throughout this episode.    Past Medical History:  has a past medical history of Alcohol abuse, Anemia, Anxiety, Arthritis, Cancer ([place]), COPD (chronic obstructive pulmonary disease) ([place]), Coronary artery disease, Depression, Gout, Hypertension, Myocardial infarction [facility]), Neuromuscular disorder ([place]), Seizures ([place]), Stroke ([place]), and Tremors of nervous system.  Past Surgical History:  Past Surgical History:  Procedure Laterality Date  . CARPAL TUNNEL RELEASE Left 10/19/2015   Procedure: CARPAL TUNNEL RELEASE;  Surgeon: Hessie Knows, MD;  Location: [facility] ORS;  Service: Orthopedics;  Laterality: Left;  . COLON SURGERY    . JOINT REPLACEMENT     left partial hip   . THROAT SURGERY  2013   cancer    Family History: family history includes Cancer in his mother; Hypertension in his father; Leukemia in his brother.  Social History:  reports that he has been smoking. He has never used smokeless tobacco. He reports current alcohol use. He reports that he does not use drugs.  Current Medications:  bisacodyl (DULCOLAX) 10 MG suppository Place 10 mg rectally daily. [provider] Needs Review  metoprolol succinate (TOPROL-XL) 25 MG 24 hr tablet Take 25 mg by mouth daily. [provider] Needs Review  Wheat Dextrin (BENEFIBER DRINK MIX PO) Take 5 g by mouth daily. [provider] Needs Review  Reviewed Prior to Admission Medications  Medication Details Provider Last Reconciliation Status  acetaminophen (TYLENOL) 650 MG CR tablet Take 650 mg by mouth every 6 (six) hours.  Jennye Boroughs, MD Not Ordered  ALPRAZolam Duanne Moron) 0.5 MG  tablet Take 1 tablet (0.5 mg total) by mouth 3 (three) times daily. Jennye Boroughs, MD Reordered  Ordered as: ALPRAZolam Duanne Moron) tablet 0.5 mg - 0.5 mg, Oral, 3 times daily, First dose on Sun 08/03/19 at 1700  alum & mag hydroxide-simeth (MYLANTA) 200-200-20 MG/5ML suspension Take 30 mLs by mouth every 12 (twelve) hours as needed.  Jennye Boroughs, MD Not Ordered  aspirin 81 MG tablet Take 81 mg by mouth daily. Jennye Boroughs, MD Not Ordered  Dextromethorphan-Benzocaine (CEPACOL SORE THROAT & COUGH) 5-7.5 MG LOZG Use as directed 1 lozenge in the mouth or throat every 2 (two) hours as needed (sore throat). Jennye Boroughs, MD Not Ordered  ergocalciferol (VITAMIN D2) 50000 units capsule Take 50,000 Units by mouth every 30 (thirty) days. Jennye Boroughs, MD Not Ordered  furosemide (LASIX) 40 MG tablet Take 60 mg by mouth daily.  Jennye Boroughs, MD Not Ordered  gabapentin (NEURONTIN) 400 MG capsule Take 2 capsules (800 mg total) by mouth 3 (three) times daily. Jennye Boroughs, MD Not Ordered  lactulose (CHRONULAC) 10 GM/15ML solution Take 20 g by mouth daily. Jennye Boroughs, MD Not Ordered  levETIRAcetam (KEPPRA) 500 MG tablet Take 500 mg by mouth 2 (two) times daily.  Jennye Boroughs, MD Not Ordered  lisinopril (PRINIVIL,ZESTRIL) 5 MG tablet Take 7.5 mg by mouth daily. Jennye Boroughs, MD Not Ordered  nicotine polacrilex (NICORETTE) 2 MG gum Take 2 mg by mouth every 4 (four) hours as needed for smoking cessation. Jennye Boroughs, MD Not Ordered  nitroGLYCERIN (NITROSTAT) 0.4 MG SL tablet Place 0.4 mg  under the tongue every 5 (five) minutes as needed for chest pain. Jennye Boroughs, MD Not Ordered  nystatin (NYSTATIN) powder Apply topically 2 (two) times daily. Groin rash Jennye Boroughs, MD Not Ordered  ondansetron (ZOFRAN) 4 MG tablet Take 4 mg by mouth every 8 (eight) hours as needed for nausea or vomiting. Jennye Boroughs, MD Not Ordered  pantoprazole (PROTONIX) 20 MG tablet Take 20 mg by mouth daily. Jennye Boroughs, MD Not Ordered  polyethylene glycol (MIRALAX / GLYCOLAX) packet Take 17 g by mouth daily.  Jennye Boroughs, MD Not Ordered  sertraline (ZOLOFT) 25 MG tablet Take 25 mg by mouth daily. Jennye Boroughs, MD Not Ordered  simvastatin (ZOCOR) 10 MG tablet Take 10 mg by mouth at bedtime.  Jennye Boroughs, MD Not Ordered  tiotropium (SPIRIVA) 18 MCG inhalation capsule Place 18 mcg into inhaler and inhale daily. Jennye Boroughs, MD Not Ordered  trolamine salicylate (ASPERCREME) 10 % cream Apply 1 application topically 3 (three) times daily as needed. To neck  Jennye Boroughs, MD Not Ordered     Allergies:  Allergies as of 08/03/2019  . (No Known Allergies)    ROS:  Unable to obtain due to patient's lack of answering questions     Objective:     BP 132/85 (BP Location: Left Arm)   Pulse 99   Temp 98.2 F (36.8 C) (Oral)   Resp 16   Ht 5\' 11"  (1.803 m)   Wt 98.9 kg   SpO2 98%   BMI 30.40 kg/m   Constitutional :  alert and no distress  Lymphatics/Throat:  no asymmetry, masses, or scars  Respiratory:  clear to auscultation bilaterally  Cardiovascular:  regular rate and rhythm  Gastrointestinal: soft, no guarding, minimal TTP in RLQ and epigastric region. NG in place with bilious output.  Distended with tympany.  Musculoskeletal: Steady movement  Skin: Cool and moist  Psychiatric: Normal affect, non-agitated, not confused       LABS:  CMP Latest Ref Rng & Units 08/03/2019 04/04/2019 04/03/2019  Glucose 70 - 99 mg/dL 106(H) 85 55(L)  BUN 8 - 23 mg/dL 9 10 12   Creatinine 0.61 - 1.24 mg/dL 1.25(H) 1.20 1.35(H)  Sodium 135 - 145 mmol/L 143 140 138  Potassium 3.5 - 5.1 mmol/L 2.8(L) 4.3 4.3  Chloride 98 - 111 mmol/L 99 108 102  CO2 22 - 32 mmol/L 29 22 28   Calcium 8.9 - 10.3 mg/dL 9.1 8.7(L) 9.6  Total Protein 6.5 - 8.1 g/dL 7.4 - 8.7(H)  Total Bilirubin 0.3 - 1.2 mg/dL 0.8 - 0.5  Alkaline Phos 38 - 126 U/L 62 - 70  AST 15 - 41 U/L 16 - 23  ALT 0 - 44 U/L 14 - 23   CBC  Latest Ref Rng & Units 08/03/2019 04/04/2019 04/03/2019  WBC 4.0 - 10.5 K/uL 6.9 4.0 4.9  Hemoglobin 13.0 - 17.0 g/dL 13.7 13.2 13.9  Hematocrit 39.0 - 52.0 % 42.2 41.4 44.4  Platelets 150 - 400 K/uL 195 185 228    RADS: CLINICAL DATA:  Abdominal pain, distention, nausea, vomiting and diarrhea.  EXAM: CT ABDOMEN AND PELVIS WITH CONTRAST  TECHNIQUE: Multidetector CT imaging of the abdomen and pelvis was performed using the standard protocol following bolus administration of intravenous contrast.  CONTRAST:  145mL OMNIPAQUE IOHEXOL 300 MG/ML  SOLN  COMPARISON:  04/02/2019  FINDINGS: Lower chest: Stable bibasilar scarring.  Hepatobiliary: No focal liver abnormality is seen. No gallstones, gallbladder wall thickening, or biliary dilatation.  Pancreas: Unremarkable. No pancreatic  ductal dilatation or surrounding inflammatory changes.  Spleen: Normal in size without focal abnormality.  Adrenals/Urinary Tract: Adrenal glands demonstrates stable bilateral nodules. Stable cortical scarring of both kidneys. No renal calculi, focal lesion, or hydronephrosis. Bladder is unremarkable.  Stomach/Bowel: The stomach is moderately dilated and contains an air-fluid level. There is some mild dilatation of proximal jejunum. The rest of the small bowel is decompressed. Stable gaseous distention of predominantly the transverse colon without obstructing lesion identified. A trace amount of free fluid is seen adjacent to the proximal descending colon. No free air.  Vascular/Lymphatic: No significant vascular findings are present. No enlarged abdominal or pelvic lymph nodes.  Reproductive: Prostate is unremarkable.  Other: No hernias or focal abscess identified.  Musculoskeletal: No acute or significant osseous findings.  IMPRESSION: 1. Stable gaseous distention of predominantly the transverse colon without obstructing lesion identified by CT. A trace amount of free fluid  is seen adjacent to the proximal descending colon. 2. Gastric distension and some distention of proximal small bowel. Findings are suggestive of ileus or enteritis. 3. Stable bilateral adrenal nodules. 4. Stable bilateral renal cortical scarring.   Electronically Signed   By: Aletta Edouard M.D.   On: 08/03/2019 15:04  Assessment:   N/V  Plan:   Nausea vomiting with report of abdominal pain but on exam patient only has very minimal tenderness in right lower quadrant and epigastric area.  Despite having a tympanic exam, patient states that he does not feel bloated at all.  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  Patient may have another exacerbation of this.  Surgical intervention for Ogilvie syndrome will be the last resort.  Initial treatment will be NG tube decompression and continued monitoring.  Per previous GI provider note, supposed to have a follow-up for a interval colonoscopy.  Patient states that he does not recall being instructed to follow-up for such procedure.  Since he already has an established care with Dr. Vicente Males with [facility], recommend consultation with them to see if they would be willing to proceed with a colonoscopy during admission to prevent another lack of proper follow-up.  Surgery service will continue to follow until patient is tolerating a diet.

## 2019-08-04 DIAGNOSIS — K5981 Ogilvie syndrome: Secondary | ICD-10-CM | POA: Diagnosis not present

## 2019-08-04 DIAGNOSIS — E876 Hypokalemia: Secondary | ICD-10-CM | POA: Diagnosis not present

## 2019-08-04 DIAGNOSIS — K567 Ileus, unspecified: Secondary | ICD-10-CM | POA: Diagnosis not present

## 2019-08-04 DIAGNOSIS — N390 Urinary tract infection, site not specified: Secondary | ICD-10-CM | POA: Diagnosis not present

## 2019-08-04 LAB — CBC
HCT: 40.6 % (ref 39.0–52.0)
Hemoglobin: 12.7 g/dL — ABNORMAL LOW (ref 13.0–17.0)
MCH: 29.7 pg (ref 26.0–34.0)
MCHC: 31.3 g/dL (ref 30.0–36.0)
MCV: 94.9 fL (ref 80.0–100.0)
Platelets: 191 10*3/uL (ref 150–400)
RBC: 4.28 MIL/uL (ref 4.22–5.81)
RDW: 13.2 % (ref 11.5–15.5)
WBC: 5.6 10*3/uL (ref 4.0–10.5)
nRBC: 0 % (ref 0.0–0.2)

## 2019-08-04 LAB — BASIC METABOLIC PANEL
Anion gap: 12 (ref 5–15)
BUN: 9 mg/dL (ref 8–23)
CO2: 28 mmol/L (ref 22–32)
Calcium: 8.8 mg/dL — ABNORMAL LOW (ref 8.9–10.3)
Chloride: 105 mmol/L (ref 98–111)
Creatinine, Ser: 1.03 mg/dL (ref 0.61–1.24)
GFR calc Af Amer: >60 mL/min (ref >60)
GFR calc non Af Amer: >60 mL/min (ref >60)
Glucose, Bld: 131 mg/dL — ABNORMAL HIGH (ref 70–99)
Potassium: 3.2 mmol/L — ABNORMAL LOW (ref 3.5–5.1)
Sodium: 145 mmol/L (ref 135–145)

## 2019-08-04 LAB — URINE CULTURE: Culture: >=100000 — AB

## 2019-08-04 LAB — TROPONIN I (HIGH SENSITIVITY): Troponin I (High Sensitivity): 15 ng/L (ref <18)

## 2019-08-04 LAB — HIV ANTIBODY (ROUTINE TESTING W REFLEX): HIV Screen 4th Generation wRfx: NONREACTIVE

## 2019-08-04 LAB — MAGNESIUM: Magnesium: 2.3 mg/dL (ref 1.7–2.4)

## 2019-08-04 LAB — MRSA PCR SCREENING: MRSA by PCR: NEGATIVE

## 2019-08-04 MED ORDER — BISACODYL 10 MG RE SUPP
10.0000 mg | Freq: Once | RECTAL | Status: AC
  Administered 2019-08-04: 10 mg via RECTAL
  Filled 2019-08-04: qty 1

## 2019-08-04 MED ORDER — LEVETIRACETAM IN NACL 500 MG/100ML IV SOLN
500.0000 mg | Freq: Two times a day (BID) | INTRAVENOUS | Status: DC
  Administered 2019-08-04 – 2019-08-06 (×4): 500 mg via INTRAVENOUS
  Filled 2019-08-04 (×6): qty 100

## 2019-08-04 NOTE — Progress Notes (Addendum)
Progress Note    Calvin [name]  [id number] DOB: 28-Sep-1950  DOA: 08/03/2019 PCP: Lynnell Jude, MD      Brief Narrative:    Medical records reviewed and are as summarized below:  Calvin [name] is an 68 y.o. male  with medical history significant for Olgivie syndrome, alcohol abuse, anxiety, seizure disorder, COPD, depression, hypertension, CAD, stroke, presented to the hospital with nausea, vomiting, abdominal pain and abdominal distention.    He was admitted to the hospital for ileus/colonic pseudoobstruction and probable UTI.      Assessment/Plan:   Principal Problem:   Ileus ([place]) Active Problems:   Acute UTI   Hypokalemia   QT prolongation   Body mass index is 29.64 kg/m.   Ileus/colonic pseudoobstruction: Keep NPO.  Continue decompression of stomach contents with NG tube.  Continue IV fluids for hydration.  Follow-up with general surgeon for further recommendations.  Consulted gastroenterologist to assist with management.  Probable acute UTI: Continue empiric IV Rocephin.  Follow-up urine culture.  Hypokalemia: Improved.  Replete potassium  Prolonged QTc interval on EKG: Resolved.  QTc interval on repeat EKG is 431.  EKG showed showed sinus rhythm and first-degree AV block.  Seizure disorder: Start IV Keppra since oral Keppra has been held.  History of stroke: Aspirin and statin on hold  COPD: Stable    Family Communication/Anticipated D/C date and plan/Code Status   DVT prophylaxis: Heparin Code Status: Full code Family Communication: Plan discussed with the patient Disposition Plan: Possible discharge to home in 3 to 4 days depending on clinical improvement      Subjective:   He complains of discomfort in his nose from NG tube.  No vomiting.  Abdominal pain is better.  No shortness of breath or chest pain.  Objective:    Vitals:   08/03/19 1858 08/03/19 2130 08/03/19 2218 08/04/19 0459  BP: 132/85 140/82 136/79 128/84  Pulse:  99 81 87 91  Resp: 16 16 20 20   Temp: 98.2 F (36.8 C) 97.8 F (36.6 C) 98.3 F (36.8 C) 98.4 F (36.9 C)  TempSrc: Oral  Oral Oral  SpO2: 98% 96% 100% 98%  Weight:   96.4 kg   Height:   5\' 11"  (1.803 m)     Intake/Output Summary (Last 24 hours) at 08/04/2019 1211 Last data filed at 08/04/2019 0951 Gross per 24 hour  Intake 1566.05 ml  Output 1950 ml  Net -383.95 ml   Filed Weights   08/03/19 1003 08/03/19 2218  Weight: 98.9 kg 96.4 kg    Exam:  GEN: NAD SKIN: No rash EYES: no pallor or icterus ENT: MMM, NG tube in place CV: RRR PULM: CTA B ABD: soft, distended, NT, +BS CNS: AAO x 3, non focal EXT: No edema or tenderness   Data Reviewed:   I have personally reviewed following labs and imaging studies:  Labs: Labs show the following:   Basic Metabolic Panel: Recent Labs  Lab 08/03/19 1009 08/04/19 0520  NA 143 145  K 2.8* 3.2*  CL 99 105  CO2 29 28  GLUCOSE 106* 131*  BUN 9 9  CREATININE 1.25* 1.03  CALCIUM 9.1 8.8*  MG 2.1 2.3  PHOS 3.5  --    GFR Estimated Creatinine Clearance: 79 mL/min (by C-G formula based on SCr of 1.03 mg/dL). Liver Function Tests: Recent Labs  Lab 08/03/19 1009  AST 16  ALT 14  ALKPHOS 62  BILITOT 0.8  PROT 7.4  ALBUMIN 4.0  Recent Labs  Lab 08/03/19 1009  LIPASE 22   No results for input(s): AMMONIA in the last 168 hours. Coagulation profile No results for input(s): INR, PROTIME in the last 168 hours.  CBC: Recent Labs  Lab 08/03/19 1009 08/04/19 0520  WBC 6.9 5.6  NEUTROABS 5.4  --   HGB 13.7 12.7*  HCT 42.2 40.6  MCV 92.1 94.9  PLT 195 191   Cardiac Enzymes: No results for input(s): CKTOTAL, CKMB, CKMBINDEX, TROPONINI in the last 168 hours. BNP (last 3 results) No results for input(s): PROBNP in the last 8760 hours. CBG: No results for input(s): GLUCAP in the last 168 hours. D-Dimer: No results for input(s): DDIMER in the last 72 hours. Hgb A1c: No results for input(s): HGBA1C in the  last 72 hours. Lipid Profile: No results for input(s): CHOL, HDL, LDLCALC, TRIG, CHOLHDL, LDLDIRECT in the last 72 hours. Thyroid function studies: No results for input(s): TSH, T4TOTAL, T3FREE, THYROIDAB in the last 72 hours.  Invalid input(s): FREET3 Anemia work up: No results for input(s): VITAMINB12, FOLATE, FERRITIN, TIBC, IRON, RETICCTPCT in the last 72 hours. Sepsis Labs: Recent Labs  Lab 08/03/19 1009 08/03/19 1347 08/04/19 0520  WBC 6.9  --  5.6  LATICACIDVEN  --  0.9  --     Microbiology Recent Results (from the past 240 hour(s))  SARS CORONAVIRUS 2 (TAT 6-24 HRS) Nasopharyngeal Nasopharyngeal Swab     Status: None   Collection Time: 08/03/19  3:29 PM   Specimen: Nasopharyngeal Swab  Result Value Ref Range Status   SARS Coronavirus 2 NEGATIVE NEGATIVE Final    Comment: (NOTE) SARS-CoV-2 target nucleic acids are NOT DETECTED. The SARS-CoV-2 RNA is generally detectable in upper and lower respiratory specimens during the acute phase of infection. Negative results do not preclude SARS-CoV-2 infection, do not rule out co-infections with other pathogens, and should not be used as the sole basis for treatment or other patient management decisions. Negative results must be combined with clinical observations, patient history, and epidemiological information. The expected result is Negative. Fact Sheet for Patients: SugarRoll.be Fact Sheet for Healthcare Providers: https://www.woods-mathews.com/ This test is not yet approved or cleared by the [place] FDA and  has been authorized for detection and/or diagnosis of SARS-CoV-2 by FDA under an Emergency Use Authorization (EUA). This EUA will remain  in effect (meaning this test can be used) for the duration of the COVID-19 declaration under Section 56 4(b)(1) of the Act, 21 U.S.C. section 360bbb-3(b)(1), unless the authorization is terminated or revoked sooner. Performed at  [facility], [place] [address] [place], [place] [address]   MRSA PCR Screening     Status: None   Collection Time: 08/04/19  6:35 AM   Specimen: Nasal Mucosa; Nasopharyngeal  Result Value Ref Range Status   MRSA by PCR NEGATIVE NEGATIVE Final    Comment:        The GeneXpert MRSA Assay (FDA approved for NASAL specimens only), is one component of a comprehensive MRSA colonization surveillance program. It is not intended to diagnose MRSA infection nor to guide or monitor treatment for MRSA infections. Performed at [facility], [address] [place], [place] [address]     Procedures and diagnostic studies:  Dg Abdomen 1 View  Result Date: 08/03/2019 CLINICAL DATA:  Evaluate NG tube placement EXAM: ABDOMEN - 1 VIEW COMPARISON:  KUB April 08, 2019.  CT scan August 03, 2019. FINDINGS: The side port of the NG tube is probably at or  just below the GE junction. The distal tip is in the region of the gastric body. Dilated loops of bowel are again identified. IMPRESSION: The side port of the NG tube is probably at or just below the GE junction. The distal tip is in the region of the gastric body. Electronically Signed   By: Dorise Bullion III M.D   On: 08/03/2019 16:39   Ct Abdomen Pelvis W Contrast  Result Date: 08/03/2019 CLINICAL DATA:  Abdominal pain, distention, nausea, vomiting and diarrhea. EXAM: CT ABDOMEN AND PELVIS WITH CONTRAST TECHNIQUE: Multidetector CT imaging of the abdomen and pelvis was performed using the standard protocol following bolus administration of intravenous contrast. CONTRAST:  118mL OMNIPAQUE IOHEXOL 300 MG/ML  SOLN COMPARISON:  04/02/2019 FINDINGS: Lower chest: Stable bibasilar scarring. Hepatobiliary: No focal liver abnormality is seen. No gallstones, gallbladder wall thickening, or biliary dilatation. Pancreas: Unremarkable. No pancreatic ductal dilatation or surrounding inflammatory changes. Spleen: Normal in size without focal abnormality.  Adrenals/Urinary Tract: Adrenal glands demonstrates stable bilateral nodules. Stable cortical scarring of both kidneys. No renal calculi, focal lesion, or hydronephrosis. Bladder is unremarkable. Stomach/Bowel: The stomach is moderately dilated and contains an air-fluid level. There is some mild dilatation of proximal jejunum. The rest of the small bowel is decompressed. Stable gaseous distention of predominantly the transverse colon without obstructing lesion identified. A trace amount of free fluid is seen adjacent to the proximal descending colon. No free air. Vascular/Lymphatic: No significant vascular findings are present. No enlarged abdominal or pelvic lymph nodes. Reproductive: Prostate is unremarkable. Other: No hernias or focal abscess identified. Musculoskeletal: No acute or significant osseous findings. IMPRESSION: 1. Stable gaseous distention of predominantly the transverse colon without obstructing lesion identified by CT. A trace amount of free fluid is seen adjacent to the proximal descending colon. 2. Gastric distension and some distention of proximal small bowel. Findings are suggestive of ileus or enteritis. 3. Stable bilateral adrenal nodules. 4. Stable bilateral renal cortical scarring. Electronically Signed   By: Aletta Edouard M.D.   On: 08/03/2019 15:04    Medications:   . ALPRAZolam  0.5 mg Oral TID  . chlorhexidine  15 mL Mouth Rinse BID  . fentaNYL (SUBLIMAZE) injection  50 mcg Intravenous Once  . heparin  5,000 Units Subcutaneous Q8H  . mouth rinse  15 mL Mouth Rinse q12n4p   Continuous Infusions: . cefTRIAXone (ROCEPHIN)  IV    . dextrose 5 % and 0.9 % NaCl with KCl 40 mEq/L 125 mL/hr at 08/04/19 0502  . levETIRAcetam       LOS: 1 day   Juanangel Soderholm  [facility]   *Please refer to [email or web address], password [email or web address] to get updated schedule on who will round on this patient, as hospitalists switch teams weekly. If 7PM-7AM, please contact night-coverage at  [email or web address], password [email or web address] for any overnight needs.  08/04/2019, 12:11 PM

## 2019-08-04 NOTE — Progress Notes (Signed)
Notified doc of pt c/o pain to rectum. Verbal order to remove rectal tube. Acknowledged and removed pr dr. Sherryll Burger NP

## 2019-08-04 NOTE — Consult Note (Signed)
Calvin Darby, MD [address]  [place]  [place], [place] [address]  Main: [phone]  Fax: [phone] Pager: [phone]   Consultation  Referring Provider:     No ref. provider found Primary Care Physician:  Lynnell Jude, MD Primary Gastroenterologist:  Dr. Bonna Gains     Reason for Consultation: Ileus  Date of Admission:  08/03/2019 Date of Consultation:  08/04/2019         HPI:   Calvin [name] is a 68 y.o. male who lives at nursing home, past medical history as detailed below is admitted with abdominal distention as well as nausea, vomiting.  He had bowel movement yesterday. In the ER, his vitals were stable, labs revealed severe hypokalemia, 2.8, AKI, Normal WBC count.  He underwent CT abdomen and pelvis with contrast which revealed gastric distention and some distention of the proximal small bowel suggestive of ileus or enteritis.  He also has stable gaseous distention of transverse colon without obstructive lesion and presence of trace amount of free fluid adjacent to the proximal descending colon.  Patient has NG tube for decompression, resulted in about 550 mL of aspiration of bilious liquid.  Patient had similar presentation in 04/2019.  He underwent CT scan at that time as well, followed by single contrast barium enema which did not reveal any large filling defect in the colon.  He was managed conservatively and was recommended to follow-up with GI for outpatient colonoscopy.  He has history of sigmoid colon anastomosis.  NSAIDs: None  Antiplts/Anticoagulants/Anti thrombotics: None  GI Procedures: None  Past Medical History:  Diagnosis Date  . Alcohol abuse    drinks on weekend  . Anemia   . Anxiety   . Arthritis   . Cancer ([place])    colon,throat  . COPD (chronic obstructive pulmonary disease) ([place])   . Coronary artery disease   . Depression   . Gout   . Hypertension   . Myocardial infarction ([place])   . Neuromuscular disorder ([place])   .  Seizures ([place])    last 6 months ago  . Stroke [facility])    multiple  left side weakness  . Tremors of nervous system     Past Surgical History:  Procedure Laterality Date  . CARPAL TUNNEL RELEASE Left 10/19/2015   Procedure: CARPAL TUNNEL RELEASE;  Surgeon: Hessie Knows, MD;  Location: [facility] ORS;  Service: Orthopedics;  Laterality: Left;  . COLON SURGERY    . JOINT REPLACEMENT     left partial hip   . THROAT SURGERY  2013   cancer    Prior to Admission medications   Medication Sig Start Date End Date Taking? Authorizing Provider  acetaminophen (TYLENOL) 650 MG CR tablet Take 650 mg by mouth every 6 (six) hours.    Yes [provider]  ALPRAZolam (XANAX) 0.5 MG tablet Take 1 tablet (0.5 mg total) by mouth 3 (three) times daily. 10/02/17  Yes Max Sane, MD  alum & mag hydroxide-simeth (MYLANTA) 200-200-20 MG/5ML suspension Take 30 mLs by mouth every 12 (twelve) hours as needed.    Yes [provider]  aspirin 81 MG tablet Take 81 mg by mouth daily.   Yes [provider]  bisacodyl (DULCOLAX) 10 MG suppository Place 10 mg rectally daily.   Yes [provider]  Dextromethorphan-Benzocaine (CEPACOL SORE THROAT & COUGH) 5-7.5 MG LOZG Use as directed 1 lozenge in the mouth or throat every 2 (two) hours as needed (sore throat).  Yes [provider]  ergocalciferol (VITAMIN D2) 50000 units capsule Take 50,000 Units by mouth every 30 (thirty) days.   Yes [provider]  furosemide (LASIX) 40 MG tablet Take 60 mg by mouth daily.    Yes [provider]  gabapentin (NEURONTIN) 400 MG capsule Take 2 capsules (800 mg total) by mouth 3 (three) times daily. 10/02/17  Yes Max Sane, MD  lactulose (CHRONULAC) 10 GM/15ML solution Take 20 g by mouth daily.   Yes [provider]  levETIRAcetam (KEPPRA) 500 MG tablet Take 500 mg by mouth 2 (two) times daily.    Yes [provider]  lisinopril (PRINIVIL,ZESTRIL) 5 MG tablet Take  7.5 mg by mouth daily.   Yes [provider]  metoprolol succinate (TOPROL-XL) 25 MG 24 hr tablet Take 25 mg by mouth daily.   Yes [provider]  nicotine polacrilex (NICORETTE) 2 MG gum Take 2 mg by mouth every 4 (four) hours as needed for smoking cessation.   Yes [provider]  nitroGLYCERIN (NITROSTAT) 0.4 MG SL tablet Place 0.4 mg under the tongue every 5 (five) minutes as needed for chest pain.   Yes [provider]  nystatin (NYSTATIN) powder Apply topically 2 (two) times daily. Groin rash   Yes [provider]  ondansetron (ZOFRAN) 4 MG tablet Take 4 mg by mouth every 8 (eight) hours as needed for nausea or vomiting.   Yes [provider]  pantoprazole (PROTONIX) 20 MG tablet Take 20 mg by mouth daily.   Yes [provider]  polyethylene glycol (MIRALAX / GLYCOLAX) packet Take 17 g by mouth daily.    Yes [provider]  sertraline (ZOLOFT) 25 MG tablet Take 25 mg by mouth daily.   Yes [provider]  simvastatin (ZOCOR) 10 MG tablet Take 10 mg by mouth at bedtime.    Yes [provider]  tiotropium (SPIRIVA) 18 MCG inhalation capsule Place 18 mcg into inhaler and inhale daily.   Yes [provider]  trolamine salicylate (ASPERCREME) 10 % cream Apply 1 application topically 3 (three) times daily as needed. To neck    Yes [provider]  Wheat Dextrin (BENEFIBER DRINK MIX PO) Take 5 g by mouth daily.   Yes [provider]    Current Facility-Administered Medications:  .  ALPRAZolam Duanne Moron) tablet 0.5 mg, 0.5 mg, Oral, TID, Jennye Boroughs, MD, 0.5 mg at 08/04/19 1524 .  bisacodyl (DULCOLAX) suppository 10 mg, 10 mg, Rectal, Once, [place], Tally Due, MD .  cefTRIAXone (ROCEPHIN) 1 g in sodium chloride 0.9 % 100 mL IVPB, 1 g, Intravenous, Q24H, Jennye Boroughs, MD, Last Rate: 200 mL/hr at 08/04/19 1525, 1 g at 08/04/19 1525 .  chlorhexidine (PERIDEX) 0.12 % solution 15 mL,  15 mL, Mouth Rinse, BID, Jennye Boroughs, MD, 15 mL at 08/04/19 0947 .  dextrose 5 % and 0.9 % NaCl with KCl 40 mEq/L infusion, , Intravenous, Continuous, Jennye Boroughs, MD, Last Rate: 100 mL/hr at 08/04/19 1347 .  fentaNYL (SUBLIMAZE) injection 50 mcg, 50 mcg, Intravenous, Once, Jennye Boroughs, MD .  heparin injection 5,000 Units, 5,000 Units, Subcutaneous, Q8H, Jennye Boroughs, MD, 5,000 Units at 08/04/19 1337 .  levETIRAcetam (KEPPRA) IVPB 500 mg/100 mL premix, 500 mg, Intravenous, Q12H, Jennye Boroughs, MD .  MEDLINE mouth rinse, 15 mL, Mouth Rinse, q12n4p, Jennye Boroughs, MD, 15 mL at 08/04/19 1532 .  phenol (CHLORASEPTIC) mouth spray 1 spray, 1 spray, Mouth/Throat, PRN, Jennye Boroughs, MD, 1 spray at 08/04/19  0822  Family History  Problem Relation Age of Onset  . Cancer Mother   . Hypertension Father   . Leukemia Brother      Social History   Tobacco Use  . Smoking status: Current Every Day Smoker  . Smokeless tobacco: Never Used  Substance Use Topics  . Alcohol use: Yes    Comment: weekends  . Drug use: No    Allergies as of 08/03/2019  . (No Known Allergies)    Review of Systems:    All systems reviewed and negative except where noted in HPI.   Physical Exam:  Vital signs in last 24 hours: Temp:  [97.8 F (36.6 C)-98.5 F (36.9 C)] 98.5 F (36.9 C) (11/30 1251) Pulse Rate:  [79-99] 79 (11/30 1251) Resp:  [15-20] 15 (11/30 1251) BP: (128-146)/(79-87) 146/87 (11/30 1251) SpO2:  [96 %-100 %] 100 % (11/30 1251) Weight:  [96.4 kg] 96.4 kg (11/29 2218) Last BM Date: 08/03/19 General:   Pleasant, cooperative in NAD, appears delirious Head:  Normocephalic and atraumatic. Eyes:   No icterus.   Conjunctiva pink. PERRLA. Ears:  Normal auditory acuity. Neck:  Supple; no masses or thyroidomegaly Lungs: Respirations even and unlabored. Lungs clear to auscultation bilaterally.   No wheezes, crackles, or rhonchi.  Heart:  Regular rate and rhythm;  Without murmur, clicks, rubs  or gallops Abdomen:  Soft, diffusely distended, nontender. Normal bowel sounds in lower abdomen. No appreciable masses or hepatomegaly.  No rebound or guarding.  Rectal:  Not performed. Msk:  Symmetrical without gross deformities.  Strength generalized weakness Extremities:  Without edema, cyanosis or clubbing. Neurologic:  Alert and oriented x2;  grossly normal neurologically. Skin:  Intact without significant lesions or rashes. Psych:  Alert and cooperative. Normal affect.  LAB RESULTS: CBC Latest Ref Rng & Units 08/04/2019 08/03/2019 04/04/2019  WBC 4.0 - 10.5 K/uL 5.6 6.9 4.0  Hemoglobin 13.0 - 17.0 g/dL 12.7(L) 13.7 13.2  Hematocrit 39.0 - 52.0 % 40.6 42.2 41.4  Platelets 150 - 400 K/uL 191 195 185    BMET BMP Latest Ref Rng & Units 08/04/2019 08/03/2019 04/04/2019  Glucose 70 - 99 mg/dL 131(H) 106(H) 85  BUN 8 - 23 mg/dL _0 Creatinine 0.61 - 1.24 mg/dL 1.03 1.25(H) 1.20  Sodium 135 - 145 mmol/L 145 143 140  Potassium 3.5 - 5.1 mmol/L 3.2(L) 2.8(L) 4.3  Chloride 98 - 111 mmol/L 105 99 108  CO2 22 - 32 mmol/L _1 Calcium 8.9 - 10.3 mg/dL 8.8(L) 9.1 8.7(L)    LFT Hepatic Function Latest Ref Rng & Units 08/03/2019 04/03/2019 10/18/2015  Total Protein 6.5 - 8.1 g/dL 7.4 8.7(H) 8.0  Albumin 3.5 - 5.0 g/dL 4.0 4.8 4.4  AST 15 - 41 U/L 16 23 51(H)  ALT 0 - 44 U/L 14 23 35  Alk Phosphatase 38 - 126 U/L 62 70 87  Total Bilirubin 0.3 - 1.2 mg/dL 0.8 0.5 0.5     STUDIES: Dg Abdomen 1 View  Result Date: 08/03/2019 CLINICAL DATA:  Evaluate NG tube placement EXAM: ABDOMEN - 1 VIEW COMPARISON:  KUB April 08, 2019.  CT scan August 03, 2019. FINDINGS: The side port of the NG tube is probably at or just below the GE junction. The distal tip is in the region of the gastric body. Dilated loops of bowel are again identified. IMPRESSION: The side port of the NG tube is probably at or just below the GE junction. The distal tip is in the  region of the gastric body. Electronically  Signed   By: Dorise Bullion III M.D   On: 08/03/2019 16:39   Ct Abdomen Pelvis W Contrast  Result Date: 08/03/2019 CLINICAL DATA:  Abdominal pain, distention, nausea, vomiting and diarrhea. EXAM: CT ABDOMEN AND PELVIS WITH CONTRAST TECHNIQUE: Multidetector CT imaging of the abdomen and pelvis was performed using the standard protocol following bolus administration of intravenous contrast. CONTRAST:  153m OMNIPAQUE IOHEXOL 300 MG/ML  SOLN COMPARISON:  04/02/2019 FINDINGS: Lower chest: Stable bibasilar scarring. Hepatobiliary: No focal liver abnormality is seen. No gallstones, gallbladder wall thickening, or biliary dilatation. Pancreas: Unremarkable. No pancreatic ductal dilatation or surrounding inflammatory changes. Spleen: Normal in size without focal abnormality. Adrenals/Urinary Tract: Adrenal glands demonstrates stable bilateral nodules. Stable cortical scarring of both kidneys. No renal calculi, focal lesion, or hydronephrosis. Bladder is unremarkable. Stomach/Bowel: The stomach is moderately dilated and contains an air-fluid level. There is some mild dilatation of proximal jejunum. The rest of the small bowel is decompressed. Stable gaseous distention of predominantly the transverse colon without obstructing lesion identified. A trace amount of free fluid is seen adjacent to the proximal descending colon. No free air. Vascular/Lymphatic: No significant vascular findings are present. No enlarged abdominal or pelvic lymph nodes. Reproductive: Prostate is unremarkable. Other: No hernias or focal abscess identified. Musculoskeletal: No acute or significant osseous findings. IMPRESSION: 1. Stable gaseous distention of predominantly the transverse colon without obstructing lesion identified by CT. A trace amount of free fluid is seen adjacent to the proximal descending colon. 2. Gastric distension and some distention of proximal small bowel. Findings are suggestive of ileus or enteritis. 3. Stable bilateral  adrenal nodules. 4. Stable bilateral renal cortical scarring. Electronically Signed   By: GAletta EdouardM.D.   On: 08/03/2019 15:04      Impression / Plan:   Calvin ERTLis a 68y.o. male with history of stroke, Coronary artery disease, seizure disorder presented from nursing home with abdominal distention, nausea and vomiting.  CT revealed distention of stomach and proximal small bowel as well as transverse colon.  There is no evidence of bowel obstruction.  His findings are consistent with generalized ileus.  Ileus is most likely secondary to underlying comorbidities, polypharmacy and his decreased mobilization at nursing home  Continue NG tube for decompression, low intermittent suction N.p.o. Monitor electrolytes closely, maintain potassium greater than 4, magnesium greater than 2 and phosphorus greater than 3 Recommend Dulcolax suppository and rectal tube placement Recommend EGD and colonoscopy prior to discharge once he starts moving his bowels to further evaluate the cause  Thank you for involving me in the care of this patient.  Dr. TBonna Gainswill cover from tomorrow    LOS: 1 day   RSherri Sear MD  08/04/2019, 3:48 PM   Note: This dictation was prepared with Dragon dictation along with smaller phrase technology. Any transcriptional errors that result from this process are unintentional.

## 2019-08-04 NOTE — Progress Notes (Signed)
Subjective:  CC: Calvin [name] is a 68 y.o. male  Hospital stay day 1,   Ogilvie syndrome  HPI: No reported issues overnight.  ROS:  General: Denies weight loss, weight gain, fatigue, fevers, chills, and night sweats. Heart: Denies chest pain, palpitations, racing heart, irregular heartbeat, leg pain or swelling, and decreased activity tolerance. Respiratory: Denies breathing difficulty, shortness of breath, wheezing, cough, and sputum. GI: Denies change in appetite, heartburn, nausea, vomiting, constipation, diarrhea, and blood in stool. GU: Denies difficulty urinating, pain with urinating, urgency, frequency, blood in urine.   Objective:   Temp:  [97.8 F (36.6 C)-98.4 F (36.9 C)] 98.4 F (36.9 C) (11/30 0459) Pulse Rate:  [81-107] 91 (11/30 0459) Resp:  [15-22] 20 (11/30 0459) BP: (128-140)/(79-85) 128/84 (11/30 0459) SpO2:  [94 %-100 %] 98 % (11/30 0459) Weight:  [96.4 kg-98.9 kg] 96.4 kg (11/29 2218)     Height: 5\' 11"  (180.3 cm) Weight: 96.4 kg BMI (Calculated): 29.65   Intake/Output this shift:   Intake/Output Summary (Last 24 hours) at 08/04/2019 0715 Last data filed at 08/04/2019 [id number] Gross per 24 hour  Intake 1566.05 ml  Output 1800 ml  Net -233.95 ml    Constitutional :  alert, cooperative, appears stated age and no distress  Respiratory:  clear to auscultation bilaterally  Cardiovascular:  regular rate and rhythm  Gastrointestinal: soft, non-tender; bowel sounds normal; no masses,  no organomegaly.  NG with bilious output, 1.45 L noted since insertion  Skin: Cool and moist.   Psychiatric: Normal affect, non-agitated, not confused       LABS:  CMP Latest Ref Rng & Units 08/04/2019 08/03/2019 04/04/2019  Glucose 70 - 99 mg/dL 131(H) 106(H) 85  BUN 8 - 23 mg/dL 9 9 10   Creatinine 0.61 - 1.24 mg/dL 1.03 1.25(H) 1.20  Sodium 135 - 145 mmol/L 145 143 140  Potassium 3.5 - 5.1 mmol/L 3.2(L) 2.8(L) 4.3  Chloride 98 - 111 mmol/L 105 99 108  CO2 22 - 32 mmol/L  28 29 22   Calcium 8.9 - 10.3 mg/dL 8.8(L) 9.1 8.7(L)  Total Protein 6.5 - 8.1 g/dL - 7.4 -  Total Bilirubin 0.3 - 1.2 mg/dL - 0.8 -  Alkaline Phos 38 - 126 U/L - 62 -  AST 15 - 41 U/L - 16 -  ALT 0 - 44 U/L - 14 -   CBC Latest Ref Rng & Units 08/04/2019 08/03/2019 04/04/2019  WBC 4.0 - 10.5 K/uL 5.6 6.9 4.0  Hemoglobin 13.0 - 17.0 g/dL 12.7(L) 13.7 13.2  Hematocrit 39.0 - 52.0 % 40.6 42.2 41.4  Platelets 150 - 400 K/uL 191 195 185    RADS: n/a Assessment:   Colonic pseudoobstruction.  Stable for now.  Denies any flatus or bowel movements.  Remains comfortable with no tenderness to palpation on exam, but still has some distention.  Continue current management for now until return of bowel function.

## 2019-08-05 DIAGNOSIS — E876 Hypokalemia: Secondary | ICD-10-CM | POA: Diagnosis not present

## 2019-08-05 DIAGNOSIS — K567 Ileus, unspecified: Secondary | ICD-10-CM | POA: Diagnosis not present

## 2019-08-05 DIAGNOSIS — N390 Urinary tract infection, site not specified: Secondary | ICD-10-CM | POA: Diagnosis not present

## 2019-08-05 LAB — BASIC METABOLIC PANEL
Anion gap: 7 (ref 5–15)
BUN: 7 mg/dL — ABNORMAL LOW (ref 8–23)
CO2: 26 mmol/L (ref 22–32)
Calcium: 8.4 mg/dL — ABNORMAL LOW (ref 8.9–10.3)
Chloride: 113 mmol/L — ABNORMAL HIGH (ref 98–111)
Creatinine, Ser: 1.07 mg/dL (ref 0.61–1.24)
GFR calc Af Amer: >60 mL/min (ref >60)
GFR calc non Af Amer: >60 mL/min (ref >60)
Glucose, Bld: 112 mg/dL — ABNORMAL HIGH (ref 70–99)
Potassium: 3.7 mmol/L (ref 3.5–5.1)
Sodium: 146 mmol/L — ABNORMAL HIGH (ref 135–145)

## 2019-08-05 MED ORDER — SODIUM CHLORIDE 0.9 % IV SOLN
1.0000 g | Freq: Four times a day (QID) | INTRAVENOUS | Status: DC
  Administered 2019-08-05 – 2019-08-07 (×8): 1 g via INTRAVENOUS
  Filled 2019-08-05 (×2): qty 1000
  Filled 2019-08-05 (×2): qty 1
  Filled 2019-08-05 (×6): qty 1000
  Filled 2019-08-05 (×2): qty 1
  Filled 2019-08-05: qty 1000
  Filled 2019-08-05: qty 1

## 2019-08-05 MED ORDER — KCL IN DEXTROSE-NACL 40-5-0.45 MEQ/L-%-% IV SOLN
INTRAVENOUS | Status: DC
  Administered 2019-08-05 – 2019-08-06 (×3): via INTRAVENOUS
  Filled 2019-08-05 (×5): qty 1000

## 2019-08-05 MED ORDER — KETOROLAC TROMETHAMINE 15 MG/ML IJ SOLN
15.0000 mg | Freq: Once | INTRAMUSCULAR | Status: AC
  Administered 2019-08-05: 15 mg via INTRAVENOUS
  Filled 2019-08-05: qty 1

## 2019-08-05 MED ORDER — KETOROLAC TROMETHAMINE 15 MG/ML IJ SOLN
15.0000 mg | Freq: Once | INTRAMUSCULAR | Status: AC
  Administered 2019-08-05: 06:00:00 15 mg via INTRAVENOUS
  Filled 2019-08-05: qty 1

## 2019-08-05 NOTE — Progress Notes (Signed)
Calvin Antigua, MD [address] [place], [place], [place], [address] [address], [place], [place], [place], [address] Phone: [phone]  Fax: [phone]   Subjective: NG in place, with around 100 cc of output today.   Objective: Exam: Vital signs in last 24 hours: Vitals:   08/04/19 1251 08/05/19 0110 08/05/19 0304 08/05/19 0500  BP: (!) 146/87 (!) 151/87 140/81 136/77  Pulse: 79 81 82 77  Resp: 15 20 20 20   Temp: 98.5 F (36.9 C) 98.4 F (36.9 C) 98.9 F (37.2 C) 98.3 F (36.8 C)  TempSrc: Oral Oral Oral Oral  SpO2: 100% 100% 100% 100%  Weight:      Height:       Weight change:   Intake/Output Summary (Last 24 hours) at 08/05/2019 1339 Last data filed at 08/05/2019 1211 Gross per 24 hour  Intake 2527.91 ml  Output 1300 ml  Net 1227.91 ml    General: No acute distress, AAO x3 Abd: Soft, NT/ND, No HSM Skin: Warm, no rashes Neck: Supple, Trachea midline   Lab Results: Lab Results  Component Value Date   WBC 5.6 08/04/2019   HGB 12.7 (L) 08/04/2019   HCT 40.6 08/04/2019   MCV 94.9 08/04/2019   PLT 191 08/04/2019   Micro Results: Recent Results (from the past 240 hour(s))  SARS CORONAVIRUS 2 (TAT 6-24 HRS) Nasopharyngeal Nasopharyngeal Swab     Status: None   Collection Time: 08/03/19  3:29 PM   Specimen: Nasopharyngeal Swab  Result Value Ref Range Status   SARS Coronavirus 2 NEGATIVE NEGATIVE Final    Comment: (NOTE) SARS-CoV-2 target nucleic acids are NOT DETECTED. The SARS-CoV-2 RNA is generally detectable in upper and lower respiratory specimens during the acute phase of infection. Negative results do not preclude SARS-CoV-2 infection, do not rule out co-infections with other pathogens, and should not be used as the sole basis for treatment or other patient management decisions. Negative results must be combined with clinical observations, patient history, and epidemiological information. The expected result is Negative. Fact Sheet  for Patients: SugarRoll.be Fact Sheet for Healthcare Providers: https://www.woods-mathews.com/ This test is not yet approved or cleared by the [place] FDA and  has been authorized for detection and/or diagnosis of SARS-CoV-2 by FDA under an Emergency Use Authorization (EUA). This EUA will remain  in effect (meaning this test can be used) for the duration of the COVID-19 declaration under Section 56 4(b)(1) of the Act, 21 U.S.C. section 360bbb-3(b)(1), unless the authorization is terminated or revoked sooner. Performed at [facility], [place] [address], [place], [place] [address]   Urine culture     Status: Abnormal   Collection Time: 08/03/19  4:09 PM   Specimen: Urine, Clean Catch  Result Value Ref Range Status   Specimen Description   Final    URINE, CLEAN CATCH Performed at [facility], [address] [place], [place] [address]    Special Requests   Final    NONE Performed at [facility], [address] [place], [place] [address]    Culture (A)  Final    >=100,000 COLONIES/mL GROUP B STREP(S.AGALACTIAE)ISOLATED TESTING AGAINST S. AGALACTIAE NOT ROUTINELY PERFORMED DUE TO PREDICTABILITY OF AMP/PEN/VAN SUSCEPTIBILITY. Performed at [facility], [place] [address] [place],  [address]    Report Status 08/04/2019 FINAL  Final  MRSA PCR Screening     Status: None   Collection Time: 08/04/19  6:35 AM   Specimen: Nasal Mucosa; Nasopharyngeal  Result  Value Ref Range Status   MRSA by PCR NEGATIVE NEGATIVE Final    Comment:        The GeneXpert MRSA Assay (FDA approved for NASAL specimens only), is one component of a comprehensive MRSA colonization surveillance program. It is not intended to diagnose MRSA infection nor to guide or monitor treatment for MRSA infections. Performed at [facility], [address] [place], [place] [address]    Studies/Results: Dg Abdomen 1  View  Result Date: 08/03/2019 CLINICAL DATA:  Evaluate NG tube placement EXAM: ABDOMEN - 1 VIEW COMPARISON:  KUB April 08, 2019.  CT scan August 03, 2019. FINDINGS: The side port of the NG tube is probably at or just below the GE junction. The distal tip is in the region of the gastric body. Dilated loops of bowel are again identified. IMPRESSION: The side port of the NG tube is probably at or just below the GE junction. The distal tip is in the region of the gastric body. Electronically Signed   By: Dorise Bullion III M.D   On: 08/03/2019 16:39   Ct Abdomen Pelvis W Contrast  Result Date: 08/03/2019 CLINICAL DATA:  Abdominal pain, distention, nausea, vomiting and diarrhea. EXAM: CT ABDOMEN AND PELVIS WITH CONTRAST TECHNIQUE: Multidetector CT imaging of the abdomen and pelvis was performed using the standard protocol following bolus administration of intravenous contrast. CONTRAST:  156mL OMNIPAQUE IOHEXOL 300 MG/ML  SOLN COMPARISON:  04/02/2019 FINDINGS: Lower chest: Stable bibasilar scarring. Hepatobiliary: No focal liver abnormality is seen. No gallstones, gallbladder wall thickening, or biliary dilatation. Pancreas: Unremarkable. No pancreatic ductal dilatation or surrounding inflammatory changes. Spleen: Normal in size without focal abnormality. Adrenals/Urinary Tract: Adrenal glands demonstrates stable bilateral nodules. Stable cortical scarring of both kidneys. No renal calculi, focal lesion, or hydronephrosis. Bladder is unremarkable. Stomach/Bowel: The stomach is moderately dilated and contains an air-fluid level. There is some mild dilatation of proximal jejunum. The rest of the small bowel is decompressed. Stable gaseous distention of predominantly the transverse colon without obstructing lesion identified. A trace amount of free fluid is seen adjacent to the proximal descending colon. No free air. Vascular/Lymphatic: No significant vascular findings are present. No enlarged abdominal or pelvic  lymph nodes. Reproductive: Prostate is unremarkable. Other: No hernias or focal abscess identified. Musculoskeletal: No acute or significant osseous findings. IMPRESSION: 1. Stable gaseous distention of predominantly the transverse colon without obstructing lesion identified by CT. A trace amount of free fluid is seen adjacent to the proximal descending colon. 2. Gastric distension and some distention of proximal small bowel. Findings are suggestive of ileus or enteritis. 3. Stable bilateral adrenal nodules. 4. Stable bilateral renal cortical scarring. Electronically Signed   By: Aletta Edouard M.D.   On: 08/03/2019 15:04   Medications:  Scheduled Meds: . ALPRAZolam  0.5 mg Oral TID  . chlorhexidine  15 mL Mouth Rinse BID  . fentaNYL (SUBLIMAZE) injection  50 mcg Intravenous Once  . heparin  5,000 Units Subcutaneous Q8H  . mouth rinse  15 mL Mouth Rinse q12n4p   Continuous Infusions: . ampicillin (OMNIPEN) IV    . dextrose 5 % and 0.45 % NaCl with KCl 40 mEq/L 100 mL/hr at 08/05/19 0800  . levETIRAcetam 500 mg (08/05/19 0834)   PRN Meds:.phenol   Assessment: 68 year old male patient of [facility], Dr. Gustavo Lah, with generalized ileus, with previous history of colon cancer over 20 years ago with surgery for this, with no previous records available for the same  Plan: This  is the patient's second admission for ileus, with imaging not identifying any sources of obstruction  Patient responding well to NG tube decompression  The rectal tube was removed by primary team as patient complained of rectal pain  Once NG is able to be d/c'd and pt can safely drink a prep, EGD and colonoscopy can be done in 1-2 days depending on clinical status  Encourage position changes in bed  Monitor and replace electrolytes    LOS: 2 days   Calvin Antigua, MD 08/05/2019, 1:39 PM

## 2019-08-05 NOTE — Progress Notes (Signed)
Progress Note    Calvin [name]  [id number] DOB: 19-Jun-1951  DOA: 08/03/2019 PCP: Lynnell Jude, MD      Brief Narrative:    Medical records reviewed and are as summarized below:  Calvin [name] is an 68 y.o. male  with medical history significant for Olgivie syndrome, alcohol abuse, anxiety, seizure disorder, COPD, depression, hypertension, CAD, stroke, presented to the hospital with nausea, vomiting, abdominal pain and abdominal distention.    He was admitted to the hospital for ileus/colonic pseudoobstruction and probable UTI.      Assessment/Plan:   Principal Problem:   Ileus ([place]) Active Problems:   Acute UTI   Hypokalemia   QT prolongation   Body mass index is 29.64 kg/m.   Ileus/colonic pseudoobstruction: Keep NPO.  He still has high output from NG tube.  Continue decompression of stomach contents with NG tube.  Continue IV fluids for hydration.  Follow-up with general surgeon and gastroenterologist for further recommendations.  Gastroenterologist plans to perform colonoscopy prior to discharge  Acute strep agalactiae UTI: Continue empiric IV Rocephin.  Follow-up urine culture.  Hypokalemia: Improved.  Replete potassium  Mild hyponatremia: Changed IV fluids from D5 NS with KCl to D5 half NS with KCl.  Monitor BMP.  Prolonged QTc interval on EKG: Resolved.  QTc interval on repeat EKG was 431.  EKG showed showed sinus rhythm and first-degree AV block.  Seizure disorder: Continue IV Keppra since oral Keppra has been held.  History of stroke: Aspirin and statin on hold  COPD: Stable    Family Communication/Anticipated D/C date and plan/Code Status   DVT prophylaxis: Heparin Code Status: Full code Family Communication: Plan discussed with the patient Disposition Plan: Possible discharge to home in 3 to 4 days depending on clinical improvement      Subjective:   He still has some abdominal pain.  He said he requested a rectal to be removed  because of rectal pain.  No other complaints.  Objective:    Vitals:   08/04/19 1251 08/05/19 0110 08/05/19 0304 08/05/19 0500  BP: (!) 146/87 (!) 151/87 140/81 136/77  Pulse: 79 81 82 77  Resp: 15 20 20 20   Temp: 98.5 F (36.9 C) 98.4 F (36.9 C) 98.9 F (37.2 C) 98.3 F (36.8 C)  TempSrc: Oral Oral Oral Oral  SpO2: 100% 100% 100% 100%  Weight:      Height:        Intake/Output Summary (Last 24 hours) at 08/05/2019 1217 Last data filed at 08/05/2019 1211 Gross per 24 hour  Intake 2527.91 ml  Output 1300 ml  Net 1227.91 ml   Filed Weights   08/03/19 1003 08/03/19 2218  Weight: 98.9 kg 96.4 kg    Exam:  GEN: NAD SKIN: No rash EYES: No pallor or icterus ENT: MMM, NG tube connected to intermittent low wall suction with greenish fluid noted in canister. CV: RRR PULM: CTA B  ABD: soft, distended, NT, +BS CNS: AAO x 3, non focal EXT: No edema or tenderness    Data Reviewed:   I have personally reviewed following labs and imaging studies:  Labs: Labs show the following:   Basic Metabolic Panel: Recent Labs  Lab 08/03/19 1009 08/04/19 0520 08/05/19 0502  NA 143 145 146*  K 2.8* 3.2* 3.7  CL 99 105 113*  CO2 29 28 26   GLUCOSE 106* 131* 112*  BUN 9 9 7*  CREATININE 1.25* 1.03 1.07  CALCIUM 9.1 8.8* 8.4*  MG  2.1 2.3  --   PHOS 3.5  --   --    GFR Estimated Creatinine Clearance: 76.1 mL/min (by C-G formula based on SCr of 1.07 mg/dL). Liver Function Tests: Recent Labs  Lab 08/03/19 1009  AST 16  ALT 14  ALKPHOS 62  BILITOT 0.8  PROT 7.4  ALBUMIN 4.0   Recent Labs  Lab 08/03/19 1009  LIPASE 22   No results for input(s): AMMONIA in the last 168 hours. Coagulation profile No results for input(s): INR, PROTIME in the last 168 hours.  CBC: Recent Labs  Lab 08/03/19 1009 08/04/19 0520  WBC 6.9 5.6  NEUTROABS 5.4  --   HGB 13.7 12.7*  HCT 42.2 40.6  MCV 92.1 94.9  PLT 195 191   Cardiac Enzymes: No results for input(s): CKTOTAL,  CKMB, CKMBINDEX, TROPONINI in the last 168 hours. BNP (last 3 results) No results for input(s): PROBNP in the last 8760 hours. CBG: No results for input(s): GLUCAP in the last 168 hours. D-Dimer: No results for input(s): DDIMER in the last 72 hours. Hgb A1c: No results for input(s): HGBA1C in the last 72 hours. Lipid Profile: No results for input(s): CHOL, HDL, LDLCALC, TRIG, CHOLHDL, LDLDIRECT in the last 72 hours. Thyroid function studies: No results for input(s): TSH, T4TOTAL, T3FREE, THYROIDAB in the last 72 hours.  Invalid input(s): FREET3 Anemia work up: No results for input(s): VITAMINB12, FOLATE, FERRITIN, TIBC, IRON, RETICCTPCT in the last 72 hours. Sepsis Labs: Recent Labs  Lab 08/03/19 1009 08/03/19 1347 08/04/19 0520  WBC 6.9  --  5.6  LATICACIDVEN  --  0.9  --     Microbiology Recent Results (from the past 240 hour(s))  SARS CORONAVIRUS 2 (TAT 6-24 HRS) Nasopharyngeal Nasopharyngeal Swab     Status: None   Collection Time: 08/03/19  3:29 PM   Specimen: Nasopharyngeal Swab  Result Value Ref Range Status   SARS Coronavirus 2 NEGATIVE NEGATIVE Final    Comment: (NOTE) SARS-CoV-2 target nucleic acids are NOT DETECTED. The SARS-CoV-2 RNA is generally detectable in upper and lower respiratory specimens during the acute phase of infection. Negative results do not preclude SARS-CoV-2 infection, do not rule out co-infections with other pathogens, and should not be used as the sole basis for treatment or other patient management decisions. Negative results must be combined with clinical observations, patient history, and epidemiological information. The expected result is Negative. Fact Sheet for Patients: SugarRoll.be Fact Sheet for Healthcare Providers: https://www.woods-mathews.com/ This test is not yet approved or cleared by the [place] FDA and  has been authorized for detection and/or diagnosis of SARS-CoV-2  by FDA under an Emergency Use Authorization (EUA). This EUA will remain  in effect (meaning this test can be used) for the duration of the COVID-19 declaration under Section 56 4(b)(1) of the Act, 21 U.S.C. section 360bbb-3(b)(1), unless the authorization is terminated or revoked sooner. Performed at [facility], [place] [address], [place], [place] [address]   Urine culture     Status: Abnormal   Collection Time: 08/03/19  4:09 PM   Specimen: Urine, Clean Catch  Result Value Ref Range Status   Specimen Description   Final    URINE, CLEAN CATCH Performed at [facility], [address] [place], [place] [address]    Special Requests   Final    NONE Performed at [facility], [address] [place], [place] [address]    Culture (A)  Final    >=100,000 COLONIES/mL GROUP  B STREP(S.AGALACTIAE)ISOLATED TESTING AGAINST S. AGALACTIAE NOT ROUTINELY PERFORMED DUE TO PREDICTABILITY OF AMP/PEN/VAN SUSCEPTIBILITY. Performed at [facility], [place] [address], [place], [place] [address]    Report Status 08/04/2019 FINAL  Final  MRSA PCR Screening     Status: None   Collection Time: 08/04/19  6:35 AM   Specimen: Nasal Mucosa; Nasopharyngeal  Result Value Ref Range Status   MRSA by PCR NEGATIVE NEGATIVE Final    Comment:        The GeneXpert MRSA Assay (FDA approved for NASAL specimens only), is one component of a comprehensive MRSA colonization surveillance program. It is not intended to diagnose MRSA infection nor to guide or monitor treatment for MRSA infections. Performed at [facility], [address] [place], [place] [address]     Procedures and diagnostic studies:  Dg Abdomen 1 View  Result Date: 08/03/2019 CLINICAL DATA:  Evaluate NG tube placement EXAM: ABDOMEN - 1 VIEW COMPARISON:  KUB April 08, 2019.  CT scan August 03, 2019. FINDINGS: The side port of the NG tube is probably at or just below the GE junction. The distal tip is  in the region of the gastric body. Dilated loops of bowel are again identified. IMPRESSION: The side port of the NG tube is probably at or just below the GE junction. The distal tip is in the region of the gastric body. Electronically Signed   By: Dorise Bullion III M.D   On: 08/03/2019 16:39   Ct Abdomen Pelvis W Contrast  Result Date: 08/03/2019 CLINICAL DATA:  Abdominal pain, distention, nausea, vomiting and diarrhea. EXAM: CT ABDOMEN AND PELVIS WITH CONTRAST TECHNIQUE: Multidetector CT imaging of the abdomen and pelvis was performed using the standard protocol following bolus administration of intravenous contrast. CONTRAST:  158mL OMNIPAQUE IOHEXOL 300 MG/ML  SOLN COMPARISON:  04/02/2019 FINDINGS: Lower chest: Stable bibasilar scarring. Hepatobiliary: No focal liver abnormality is seen. No gallstones, gallbladder wall thickening, or biliary dilatation. Pancreas: Unremarkable. No pancreatic ductal dilatation or surrounding inflammatory changes. Spleen: Normal in size without focal abnormality. Adrenals/Urinary Tract: Adrenal glands demonstrates stable bilateral nodules. Stable cortical scarring of both kidneys. No renal calculi, focal lesion, or hydronephrosis. Bladder is unremarkable. Stomach/Bowel: The stomach is moderately dilated and contains an air-fluid level. There is some mild dilatation of proximal jejunum. The rest of the small bowel is decompressed. Stable gaseous distention of predominantly the transverse colon without obstructing lesion identified. A trace amount of free fluid is seen adjacent to the proximal descending colon. No free air. Vascular/Lymphatic: No significant vascular findings are present. No enlarged abdominal or pelvic lymph nodes. Reproductive: Prostate is unremarkable. Other: No hernias or focal abscess identified. Musculoskeletal: No acute or significant osseous findings. IMPRESSION: 1. Stable gaseous distention of predominantly the transverse colon without obstructing  lesion identified by CT. A trace amount of free fluid is seen adjacent to the proximal descending colon. 2. Gastric distension and some distention of proximal small bowel. Findings are suggestive of ileus or enteritis. 3. Stable bilateral adrenal nodules. 4. Stable bilateral renal cortical scarring. Electronically Signed   By: Aletta Edouard M.D.   On: 08/03/2019 15:04    Medications:   . ALPRAZolam  0.5 mg Oral TID  . chlorhexidine  15 mL Mouth Rinse BID  . fentaNYL (SUBLIMAZE) injection  50 mcg Intravenous Once  . heparin  5,000 Units Subcutaneous Q8H  . mouth rinse  15 mL Mouth Rinse q12n4p   Continuous Infusions: . ampicillin (OMNIPEN) IV    .  dextrose 5 % and 0.45 % NaCl with KCl 40 mEq/L 100 mL/hr at 08/05/19 0800  . levETIRAcetam 500 mg (08/05/19 0834)     LOS: 2 days   Tierre Netto  [facility]   *Please refer to [email or web address], password [email or web address] to get updated schedule on who will round on this patient, as hospitalists switch teams weekly. If 7PM-7AM, please contact night-coverage at [email or web address], password [email or web address] for any overnight needs.  08/05/2019, 12:17 PM

## 2019-08-05 NOTE — Progress Notes (Signed)
Subjective:  CC: Calvin [name] is a 68 y.o. male  Hospital stay day 2,   Ogilvie syndrome  HPI: No reported issues overnight. Documented BM  ROS:  General: Denies weight loss, weight gain, fatigue, fevers, chills, and night sweats. Heart: Denies chest pain, palpitations, racing heart, irregular heartbeat, leg pain or swelling, and decreased activity tolerance. Respiratory: Denies breathing difficulty, shortness of breath, wheezing, cough, and sputum. GI: Denies change in appetite, heartburn, nausea, vomiting, constipation, diarrhea, and blood in stool. GU: Denies difficulty urinating, pain with urinating, urgency, frequency, blood in urine.   Objective:   Temp:  [98.3 F (36.8 C)-98.9 F (37.2 C)] 98.3 F (36.8 C) (12/01 0500) Pulse Rate:  [77-82] 77 (12/01 0500) Resp:  [20] 20 (12/01 0500) BP: (136-151)/(77-87) 136/77 (12/01 0500) SpO2:  [100 %] 100 % (12/01 0500)     Height: 5\' 11"  (180.3 cm) Weight: 96.4 kg BMI (Calculated): 29.65   Intake/Output this shift:   Intake/Output Summary (Last 24 hours) at 08/05/2019 1542 Last data filed at 08/05/2019 1211 Gross per 24 hour  Intake 2527.91 ml  Output 1200 ml  Net 1327.91 ml    Constitutional :  alert, cooperative, appears stated age and no distress  Respiratory:  clear to auscultation bilaterally  Cardiovascular:  regular rate and rhythm  Gastrointestinal: soft, non-tender; bowel sounds normal; no masses,  no organomegaly.  NG with bilious output,622ml noted last 24hrs.  Decreased distention today.  Skin: Cool and moist.   Psychiatric: Normal affect, non-agitated, not confused       LABS:  CMP Latest Ref Rng & Units 08/05/2019 08/04/2019 08/03/2019  Glucose 70 - 99 mg/dL 112(H) 131(H) 106(H)  BUN 8 - 23 mg/dL 7(L) 9 9  Creatinine 0.61 - 1.24 mg/dL 1.07 1.03 1.25(H)  Sodium 135 - 145 mmol/L 146(H) 145 143  Potassium 3.5 - 5.1 mmol/L 3.7 3.2(L) 2.8(L)  Chloride 98 - 111 mmol/L 113(H) 105 99  CO2 22 - 32 mmol/L 26 28 29    Calcium 8.9 - 10.3 mg/dL 8.4(L) 8.8(L) 9.1  Total Protein 6.5 - 8.1 g/dL - - 7.4  Total Bilirubin 0.3 - 1.2 mg/dL - - 0.8  Alkaline Phos 38 - 126 U/L - - 62  AST 15 - 41 U/L - - 16  ALT 0 - 44 U/L - - 14   CBC Latest Ref Rng & Units 08/04/2019 08/03/2019 04/04/2019  WBC 4.0 - 10.5 K/uL 5.6 6.9 4.0  Hemoglobin 13.0 - 17.0 g/dL 12.7(L) 13.7 13.2  Hematocrit 39.0 - 52.0 % 40.6 42.2 41.4  Platelets 150 - 400 K/uL 191 195 185    RADS: n/a Assessment:   Colonic pseudoobstruction. Exam improved today with report stool output. Will try clamp trial and potentially pull NG today.  Once he is able to advance diet as tolerated, surgery will sign off

## 2019-08-06 ENCOUNTER — Inpatient Hospital Stay: Payer: Medicare Other

## 2019-08-06 DIAGNOSIS — K567 Ileus, unspecified: Secondary | ICD-10-CM | POA: Diagnosis not present

## 2019-08-06 DIAGNOSIS — N390 Urinary tract infection, site not specified: Secondary | ICD-10-CM | POA: Diagnosis not present

## 2019-08-06 DIAGNOSIS — E876 Hypokalemia: Secondary | ICD-10-CM | POA: Diagnosis not present

## 2019-08-06 LAB — BLOOD GAS, ARTERIAL
Acid-Base Excess: 1 mmol/L (ref 0.0–2.0)
Bicarbonate: 25.2 mmol/L (ref 20.0–28.0)
FIO2: 0.21
O2 Saturation: 93.3 %
Patient temperature: 37
pCO2 arterial: 38 mmHg (ref 32.0–48.0)
pH, Arterial: 7.43 (ref 7.350–7.450)
pO2, Arterial: 66 mmHg — ABNORMAL LOW (ref 83.0–108.0)

## 2019-08-06 LAB — BASIC METABOLIC PANEL
Anion gap: 7 (ref 5–15)
BUN: 8 mg/dL (ref 8–23)
CO2: 25 mmol/L (ref 22–32)
Calcium: 8.6 mg/dL — ABNORMAL LOW (ref 8.9–10.3)
Chloride: 113 mmol/L — ABNORMAL HIGH (ref 98–111)
Creatinine, Ser: 1.07 mg/dL (ref 0.61–1.24)
GFR calc Af Amer: >60 mL/min (ref >60)
GFR calc non Af Amer: >60 mL/min (ref >60)
Glucose, Bld: 104 mg/dL — ABNORMAL HIGH (ref 70–99)
Potassium: 4.1 mmol/L (ref 3.5–5.1)
Sodium: 145 mmol/L (ref 135–145)

## 2019-08-06 LAB — PHOSPHORUS: Phosphorus: 2.3 mg/dL — ABNORMAL LOW (ref 2.5–4.6)

## 2019-08-06 LAB — MAGNESIUM: Magnesium: 2.1 mg/dL (ref 1.7–2.4)

## 2019-08-06 LAB — GLUCOSE, CAPILLARY: Glucose-Capillary: 72 mg/dL (ref 70–99)

## 2019-08-06 MED ORDER — GABAPENTIN 400 MG PO CAPS: 800.0000 mg | ORAL_CAPSULE | Freq: Three times a day (TID) | ORAL | Status: DC

## 2019-08-06 MED ORDER — TIOTROPIUM BROMIDE MONOHYDRATE 18 MCG IN CAPS
18.0000 ug | ORAL_CAPSULE | Freq: Every day | RESPIRATORY_TRACT | Status: DC
  Administered 2019-08-07 – 2019-08-14 (×8): 18 ug via RESPIRATORY_TRACT
  Filled 2019-08-06 (×2): qty 5

## 2019-08-06 MED ORDER — METOPROLOL SUCCINATE ER 25 MG PO TB24
25.0000 mg | ORAL_TABLET | Freq: Every day | ORAL | Status: DC
  Administered 2019-08-06 – 2019-08-08 (×3): 25 mg via ORAL
  Filled 2019-08-06 (×3): qty 1

## 2019-08-06 MED ORDER — LISINOPRIL 5 MG PO TABS
7.5000 mg | ORAL_TABLET | Freq: Every day | ORAL | Status: DC
  Administered 2019-08-06 – 2019-08-08 (×3): 7.5 mg via ORAL
  Filled 2019-08-06 (×3): qty 1

## 2019-08-06 MED ORDER — PEG 3350-KCL-NA BICARB-NACL 420 G PO SOLR
4000.0000 mL | Freq: Once | ORAL | Status: AC
  Administered 2019-08-06: 4000 mL via ORAL
  Filled 2019-08-06: qty 4000

## 2019-08-06 MED ORDER — ALPRAZOLAM 0.5 MG PO TABS
0.5000 mg | ORAL_TABLET | Freq: Three times a day (TID) | ORAL | Status: DC | PRN
  Administered 2019-08-06 – 2019-08-10 (×3): 0.5 mg via ORAL
  Filled 2019-08-06 (×3): qty 1

## 2019-08-06 MED ORDER — SERTRALINE HCL 50 MG PO TABS
25.0000 mg | ORAL_TABLET | Freq: Every day | ORAL | Status: DC
  Administered 2019-08-06: 13:00:00 25 mg via ORAL
  Filled 2019-08-06: qty 1

## 2019-08-06 MED ORDER — ASPIRIN EC 81 MG PO TBEC
81.0000 mg | DELAYED_RELEASE_TABLET | Freq: Every day | ORAL | Status: DC
  Administered 2019-08-06 – 2019-08-08 (×3): 81 mg via ORAL
  Filled 2019-08-06 (×3): qty 1

## 2019-08-06 MED ORDER — LEVETIRACETAM 500 MG PO TABS
500.0000 mg | ORAL_TABLET | Freq: Two times a day (BID) | ORAL | Status: DC
  Administered 2019-08-06 – 2019-08-14 (×16): 500 mg via ORAL
  Filled 2019-08-06 (×17): qty 1

## 2019-08-06 MED ORDER — LACTULOSE 10 GM/15ML PO SOLN
20.0000 g | Freq: Every day | ORAL | Status: DC
  Administered 2019-08-07 – 2019-08-14 (×7): 20 g via ORAL
  Filled 2019-08-06 (×8): qty 30

## 2019-08-06 MED ORDER — DEXTROSE-NACL 5-0.45 % IV SOLN
INTRAVENOUS | Status: DC
  Administered 2019-08-06 – 2019-08-07 (×3): via INTRAVENOUS

## 2019-08-06 MED ORDER — GABAPENTIN 300 MG PO CAPS
600.0000 mg | ORAL_CAPSULE | Freq: Three times a day (TID) | ORAL | Status: DC
  Administered 2019-08-06 – 2019-08-11 (×16): 600 mg via ORAL
  Filled 2019-08-06 (×16): qty 2

## 2019-08-06 MED ORDER — ACETAMINOPHEN 325 MG PO TABS
650.0000 mg | ORAL_TABLET | ORAL | Status: DC | PRN
  Administered 2019-08-06 – 2019-08-11 (×9): 650 mg via ORAL
  Filled 2019-08-06 (×10): qty 2

## 2019-08-06 NOTE — Evaluation (Signed)
Physical Therapy Evaluation Patient Details Name: Calvin [name] MRN: [id number] DOB: Sep 21, 1950 Today's Date: 08/06/2019   History of Present Illness  Calvin [name] is a 31yoM who comes to [facility] on 11/29 with nausea, vomiting, abdominal pain and abdominal distention. Findings suggestive of ileus or enteritis per CT scan report.  NGT was placed. PMH: Olgivie syndrome, alcohol abuse, anxiety, seizure disorder, COPD, depression, hypertension, CAD, stroke x2 c Left hemiplegia. PTA pt AMB short distance in room, used WC for longer distances.  Clinical Impression  Pt admitted with above diagnosis. Pt currently with functional limitations due to the deficits listed below (see "PT Problem List"). Upon entry, pt in bed, awake and agreeable to participate. The pt is alert and oriented x3, pleasant, minimally conversational, and generally a fair historian. Pt not forthcoming about his current limitations throughout session, tends to perseverate on his baseline functional status. Min-modA to come to EOB, ModA to rise to standing, Pt pivots to recliner slowly, labored, using fixed objects for stability. Author provides cues for a safer transfers strategy but pt is resistance, confident in his own strategy. Pt impulsive several times, initiating mobility without warning. Pt has poor safety awareness and is not in favor of author's proximity for MinGuard assist. Pt encouraged to sit up in recliner at least through dinner, but he is not in favor and offers several points of opposition. Pt left up in recliner with RT in room, RN made aware of pt's impulsivity. Functional mobility assessment demonstrates increased effort/time requirements, poor tolerance, and need for physical assistance, whereas the patient performed these at a higher level of independence PTA. Pt will benefit from skilled PT intervention to increase independence and safety with basic mobility in preparation for discharge to the venue listed below.        Follow Up Recommendations Home health PT;Supervision/Assistance - 24 hour;Supervision for mobility/OOB    Equipment Recommendations  None recommended by PT    Recommendations for Other Services       Precautions / Restrictions Precautions Precautions: Fall Restrictions Weight Bearing Restrictions: No      Mobility  Bed Mobility Overal bed mobility: Needs Assistance Bed Mobility: Supine to Sit     Supine to sit: Mod assist     General bed mobility comments: Pt able to get heels over EOB, but lacks strenght to scoot forward and straighten out, modA provided to achieve.  Transfers Overall transfer level: Needs assistance Equipment used: 1 person hand held assist Transfers: Sit to/from Stand Sit to Stand: Mod assist         General transfer comment: Pt attempts to rise wihtout assistive device, ultimately required modA to rise to standing than steps to recliner holding onto objects. Limited detecable falls aversion. pt asking author to leg go from him and get away.  Ambulation/Gait Ambulation/Gait assistance: (no tpursued. Pt implsive, not following commands and not adequately communicating limitations.)              Stairs            Wheelchair Mobility    Modified Rankin (Stroke Patients Only)       Balance Overall balance assessment: Needs assistance;History of Falls Sitting-balance support: Single extremity supported;Feet supported Sitting balance-Leahy Scale: Fair     Standing balance support: During functional activity Standing balance-Leahy Scale: Fair                               Pertinent Vitals/Pain  Pain Assessment: No/denies pain    Home Living Family/patient expects to be discharged to:: Assisted living               Home Equipment: Wheelchair - manual;Walker - 4 wheels Additional Comments: Pt reports to live at a family care home    Prior Function Level of Independence: Needs assistance   Gait / Transfers  Assistance Needed: [phone] for bedroom distances, WC outside of room  ADL's / Homemaking Assistance Needed: supervision to minAssist for bathing/dressing. Pt AMB short distances to BR only, typically uses a WC for facility moblility. Reports he doesn;t walk much 2/2 to chronic Left hemiplegia.        Hand Dominance   Dominant Hand: Right    Extremity/Trunk Assessment   Upper Extremity Assessment Upper Extremity Assessment: Generalized weakness    Lower Extremity Assessment Lower Extremity Assessment: Generalized weakness       Communication   Communication: (some slurring, difficult to undersatnd at times)  Cognition Arousal/Alertness: Awake/alert Behavior During Therapy: WFL for tasks assessed/performed Overall Cognitive Status: Within Functional Limits for tasks assessed                                        General Comments      Exercises     Assessment/Plan    PT Assessment Patient needs continued PT services  PT Problem List Decreased strength;Decreased range of motion;Decreased activity tolerance;Decreased balance;Decreased mobility;Decreased coordination;Decreased knowledge of precautions;Decreased safety awareness       PT Treatment Interventions DME instruction;Stair training;Balance training;Functional mobility training;Therapeutic activities;Therapeutic exercise;Patient/family education    PT Goals (Current goals can be found in the Care Plan section)  Acute Rehab PT Goals PT Goal Formulation: Patient unable to participate in goal setting Time For Goal Achievement: 08/20/19    Frequency Min 2X/week   Barriers to discharge        Co-evaluation               AM-PAC PT "6 Clicks" Mobility  Outcome Measure Help needed turning from your back to your side while in a flat bed without using bedrails?: A Little Help needed moving from lying on your back to sitting on the side of a flat bed without using bedrails?: A Lot Help needed  moving to and from a bed to a chair (including a wheelchair)?: A Lot Help needed standing up from a chair using your arms (e.g., wheelchair or bedside chair)?: A Lot Help needed to walk in hospital room?: A Little Help needed climbing 3-5 steps with a railing? : A Lot 6 Click Score: 14    End of Session Equipment Utilized During Treatment: Oxygen Activity Tolerance: Patient tolerated treatment well;No increased pain;Patient limited by fatigue Patient left: in chair;with nursing/sitter in room;with call bell/phone within reach Nurse Communication: Mobility status;Precautions PT Visit Diagnosis: Difficulty in walking, not elsewhere classified (R26.2);Muscle weakness (generalized) (M62.81);Other abnormalities of gait and mobility (R26.89);Other symptoms and signs involving the nervous system (R29.898)    Time: [id number] PT Time Calculation (min) (ACUTE ONLY): 16 min   Charges:   PT Evaluation $PT Eval Moderate Complexity: 1 Mod PT Treatments $Therapeutic Exercise: 8-22 mins        5:18 PM, 08/06/19 Etta Grandchild, PT, DPT Physical Therapist - [facility]  [phone] ([place])    [place] 08/06/2019, 5:13 PM

## 2019-08-06 NOTE — Progress Notes (Signed)
Vonda Antigua, MD [address] [place], [place], [place], [address] [address], [place], [place], [place], [address] Phone: [phone]  Fax: [phone]   Subjective: NG tube removed yesterday. As per notes there are documented BMs yesterday and today under flowsheets. Pt reports flatus, no N/V   Objective: Exam: Vital signs in last 24 hours: Vitals:   08/06/19 0532 08/06/19 0744 08/06/19 0750 08/06/19 1118  BP: (!) 174/97 (!) 160/92 (!) 161/93 (!) 166/95  Pulse: 78 74 73 74  Resp: 18   17  Temp: 98.4 F (36.9 C)   (!) 97.5 F (36.4 C)  TempSrc: Oral   Oral  SpO2: 100% 100% 100% 96%  Weight:      Height:       Weight change:   Intake/Output Summary (Last 24 hours) at 08/06/2019 1402 Last data filed at 08/06/2019 1311 Gross per 24 hour  Intake 2470.63 ml  Output 1050 ml  Net 1420.63 ml    General: No acute distress, AAO x3 Abd: Soft, NT/ND, No HSM Skin: Warm, no rashes Neck: Supple, Trachea midline   Lab Results: Lab Results  Component Value Date   WBC 5.6 08/04/2019   HGB 12.7 (L) 08/04/2019   HCT 40.6 08/04/2019   MCV 94.9 08/04/2019   PLT 191 08/04/2019   Micro Results: Recent Results (from the past 240 hour(s))  SARS CORONAVIRUS 2 (TAT 6-24 HRS) Nasopharyngeal Nasopharyngeal Swab     Status: None   Collection Time: 08/03/19  3:29 PM   Specimen: Nasopharyngeal Swab  Result Value Ref Range Status   SARS Coronavirus 2 NEGATIVE NEGATIVE Final    Comment: (NOTE) SARS-CoV-2 target nucleic acids are NOT DETECTED. The SARS-CoV-2 RNA is generally detectable in upper and lower respiratory specimens during the acute phase of infection. Negative results do not preclude SARS-CoV-2 infection, do not rule out co-infections with other pathogens, and should not be used as the sole basis for treatment or other patient management decisions. Negative results must be combined with clinical observations, patient history, and epidemiological information.  The expected result is Negative. Fact Sheet for Patients: SugarRoll.be Fact Sheet for Healthcare Providers: https://www.woods-mathews.com/ This test is not yet approved or cleared by the [place] FDA and  has been authorized for detection and/or diagnosis of SARS-CoV-2 by FDA under an Emergency Use Authorization (EUA). This EUA will remain  in effect (meaning this test can be used) for the duration of the COVID-19 declaration under Section 56 4(b)(1) of the Act, 21 U.S.C. section 360bbb-3(b)(1), unless the authorization is terminated or revoked sooner. Performed at [facility], [place] [address], [place], [place] [address]   Urine culture     Status: Abnormal   Collection Time: 08/03/19  4:09 PM   Specimen: Urine, Clean Catch  Result Value Ref Range Status   Specimen Description   Final    URINE, CLEAN CATCH Performed at [facility], [address], [place], [place] [address]    Special Requests   Final    NONE Performed at [facility], [address] [place], [place] [address]    Culture (A)  Final    >=100,000 COLONIES/mL GROUP B STREP(S.AGALACTIAE)ISOLATED TESTING AGAINST S. AGALACTIAE NOT ROUTINELY PERFORMED DUE TO PREDICTABILITY OF AMP/PEN/VAN SUSCEPTIBILITY. Performed at [facility], [place] [address] [place], [place] [address]    Report Status 08/04/2019 FINAL  Final  MRSA PCR Screening     Status: None   Collection Time: 08/04/19  6:35 AM  Specimen: Nasal Mucosa; Nasopharyngeal  Result Value Ref Range Status   MRSA by PCR NEGATIVE NEGATIVE Final    Comment:        The GeneXpert MRSA Assay (FDA approved for NASAL specimens only), is one component of a comprehensive MRSA colonization surveillance program. It is not intended to diagnose MRSA infection nor to guide or monitor treatment for MRSA infections. Performed at [name], [address] [place], [place] [address]     Studies/Results: No results found. Medications:  Scheduled Meds: . aspirin EC  81 mg Oral Daily  . chlorhexidine  15 mL Mouth Rinse BID  . gabapentin  600 mg Oral TID  . heparin  5,000 Units Subcutaneous Q8H  . [START ON 08/07/2019] lactulose  20 g Oral Daily  . levETIRAcetam  500 mg Oral BID  . lisinopril  7.5 mg Oral Daily  . mouth rinse  15 mL Mouth Rinse q12n4p  . metoprolol succinate  25 mg Oral Daily  . [START ON 08/07/2019] tiotropium  18 mcg Inhalation Daily   Continuous Infusions: . ampicillin (OMNIPEN) IV 1 g (08/06/19 1221)  . dextrose 5 % and 0.45% NaCl     PRN Meds:.ALPRAZolam, phenol   Assessment: Principal Problem:   Ileus (HCC) Active Problems:   Acute UTI   Hypokalemia   QT prolongation    Plan: Will obtain abdominal X-ray today If no concerning findings on X-ray today, start slow colonoscopy prep for EGD/colonoscopy in 1-2 days given recurrent ileus and CT findings and history of colon surgery   LOS: 3 days   Vonda Antigua, MD 08/06/2019, 2:02 PM

## 2019-08-06 NOTE — Progress Notes (Signed)
Progress Note    Calvin [name]  [id number] DOB: Jul 29, 1951  DOA: 08/03/2019 PCP: Lynnell Jude, MD      Brief Narrative:    Medical records reviewed and are as summarized below:  Calvin [name] is an 68 y.o. male  with medical history significant for Olgivie syndrome, alcohol abuse, anxiety, seizure disorder, COPD, depression, hypertension, CAD, stroke, presented to the hospital with nausea, vomiting, abdominal pain and abdominal distention.    He was admitted to the hospital for ileus/colonic pseudoobstruction and probable UTI.      Assessment/Plan:   Principal Problem:   Ileus ([place]) Active Problems:   Acute UTI   Hypokalemia   QT prolongation   Body mass index is 29.64 kg/m.   Ileus/colonic pseudoobstruction: Keep NPO.  He still has high output from NG tube.  Continue decompression of stomach contents with NG tube.  Continue IV fluids for hydration.  Follow-up with general surgeon and gastroenterologist for further recommendations.  Gastroenterologist plans to perform colonoscopy prior to discharge  Acute strep agalactiae UTI: Continue empiric IV Rocephin.  Follow-up urine culture.  Hypokalemia: Improved.  Replete potassium  Mild hyponatremia: Changed IV fluids from D5 NS with KCl to D5 half NS with KCl.  Monitor BMP.  Prolonged QTc interval on EKG: Resolved.  QTc interval on repeat EKG was 431.  EKG showed showed sinus rhythm and first-degree AV block.  Seizure disorder: Continue IV Keppra since oral Keppra has been held.  History of stroke: Aspirin and statin on hold  COPD: Stable  Acute metabolic encephalopathy. Patient appears to be significantly drowsy and lethargic at the time of my evaluation. No focal deficit. Patient is spontaneously moving all extremities. Suspect this is in the setting of polypharmacy with the use of IV Keppra as well as gabapentin as well as Xanax. We will reduce the dose of the gabapentin and change the Xanax to as  needed. We will also switch IV Keppra to oral Keppra. Monitor. Remains at risk for aspiration.    Family Communication/Anticipated D/C date and plan/Code Status   DVT prophylaxis: Heparin Code Status: Full code Family Communication: Plan discussed with the patient Disposition Plan: Possible discharge to home in 3 to 4 days depending on clinical improvement      Subjective:   Sleepy and drowsy.  No nausea no vomiting.  No bowel movement.  Objective:    Vitals:   08/06/19 1442 08/06/19 1630 08/06/19 1658 08/06/19 1836  BP: (!) 177/99  (!) 154/109 (!) 151/98  Pulse: 79  86 78  Resp:      Temp:      TempSrc:      SpO2:  95% 97%   Weight:      Height:        Intake/Output Summary (Last 24 hours) at 08/06/2019 1933 Last data filed at 08/06/2019 1836 Gross per 24 hour  Intake 2865.95 ml  Output 950 ml  Net 1915.95 ml   Filed Weights   08/03/19 1003 08/03/19 2218  Weight: 98.9 kg 96.4 kg    Exam:  GEN: NAD SKIN: No rash EYES: No pallor or icterus ENT: MMM, NG tube connected to intermittent low wall suction with greenish fluid noted in canister. CV: RRR PULM: CTA B  ABD: soft, distended, NT, +BS CNS: Sleepy and drowsy, not oriented x 3, non focal EXT: No edema or tenderness    Data Reviewed:   I have personally reviewed following labs and imaging studies:  Labs: Labs  show the following:   Basic Metabolic Panel: Recent Labs  Lab 08/03/19 1009 08/04/19 0520 08/05/19 0502 08/06/19 0436  NA 143 145 146* 145  K 2.8* 3.2* 3.7 4.1  CL 99 105 113* 113*  CO2 29 28 26 25   GLUCOSE 106* 131* 112* 104*  BUN 9 9 7* 8  CREATININE 1.25* 1.03 1.07 1.07  CALCIUM 9.1 8.8* 8.4* 8.6*  MG 2.1 2.3  --  2.1  PHOS 3.5  --   --  2.3*   GFR Estimated Creatinine Clearance: 76.1 mL/min (by C-G formula based on SCr of 1.07 mg/dL). Liver Function Tests: Recent Labs  Lab 08/03/19 1009  AST 16  ALT 14  ALKPHOS 62  BILITOT 0.8  PROT 7.4  ALBUMIN 4.0   Recent  Labs  Lab 08/03/19 1009  LIPASE 22   No results for input(s): AMMONIA in the last 168 hours. Coagulation profile No results for input(s): INR, PROTIME in the last 168 hours.  CBC: Recent Labs  Lab 08/03/19 1009 08/04/19 0520  WBC 6.9 5.6  NEUTROABS 5.4  --   HGB 13.7 12.7*  HCT 42.2 40.6  MCV 92.1 94.9  PLT 195 191   Cardiac Enzymes: No results for input(s): CKTOTAL, CKMB, CKMBINDEX, TROPONINI in the last 168 hours. BNP (last 3 results) No results for input(s): PROBNP in the last 8760 hours. CBG: Recent Labs  Lab 08/06/19 1337  GLUCAP 72   D-Dimer: No results for input(s): DDIMER in the last 72 hours. Hgb A1c: No results for input(s): HGBA1C in the last 72 hours. Lipid Profile: No results for input(s): CHOL, HDL, LDLCALC, TRIG, CHOLHDL, LDLDIRECT in the last 72 hours. Thyroid function studies: No results for input(s): TSH, T4TOTAL, T3FREE, THYROIDAB in the last 72 hours.  Invalid input(s): FREET3 Anemia work up: No results for input(s): VITAMINB12, FOLATE, FERRITIN, TIBC, IRON, RETICCTPCT in the last 72 hours. Sepsis Labs: Recent Labs  Lab 08/03/19 1009 08/03/19 1347 08/04/19 0520  WBC 6.9  --  5.6  LATICACIDVEN  --  0.9  --     Microbiology Recent Results (from the past 240 hour(s))  SARS CORONAVIRUS 2 (TAT 6-24 HRS) Nasopharyngeal Nasopharyngeal Swab     Status: None   Collection Time: 08/03/19  3:29 PM   Specimen: Nasopharyngeal Swab  Result Value Ref Range Status   SARS Coronavirus 2 NEGATIVE NEGATIVE Final    Comment: (NOTE) SARS-CoV-2 target nucleic acids are NOT DETECTED. The SARS-CoV-2 RNA is generally detectable in upper and lower respiratory specimens during the acute phase of infection. Negative results do not preclude SARS-CoV-2 infection, do not rule out co-infections with other pathogens, and should not be used as the sole basis for treatment or other patient management decisions. Negative results must be combined with clinical  observations, patient history, and epidemiological information. The expected result is Negative. Fact Sheet for Patients: SugarRoll.be Fact Sheet for Healthcare Providers: https://www.woods-mathews.com/ This test is not yet approved or cleared by the [place] FDA and  has been authorized for detection and/or diagnosis of SARS-CoV-2 by FDA under an Emergency Use Authorization (EUA). This EUA will remain  in effect (meaning this test can be used) for the duration of the COVID-19 declaration under Section 56 4(b)(1) of the Act, 21 U.S.C. section 360bbb-3(b)(1), unless the authorization is terminated or revoked sooner. Performed at [facility], [place] [address], [place], [place] [address]   Urine culture     Status: Abnormal   Collection Time: 08/03/19  4:09 PM  Specimen: Urine, Clean Catch  Result Value Ref Range Status   Specimen Description   Final    URINE, CLEAN CATCH Performed at [facility], [address] [place], [place] [address]    Special Requests   Final    NONE Performed at [facility], [address] [place], [place] [address]    Culture (A)  Final    >=100,000 COLONIES/mL GROUP B STREP(S.AGALACTIAE)ISOLATED TESTING AGAINST S. AGALACTIAE NOT ROUTINELY PERFORMED DUE TO PREDICTABILITY OF AMP/PEN/VAN SUSCEPTIBILITY. Performed at [facility], [place] [address] [place], [place] [address]    Report Status 08/04/2019 FINAL  Final  MRSA PCR Screening     Status: None   Collection Time: 08/04/19  6:35 AM   Specimen: Nasal Mucosa; Nasopharyngeal  Result Value Ref Range Status   MRSA by PCR NEGATIVE NEGATIVE Final    Comment:        The GeneXpert MRSA Assay (FDA approved for NASAL specimens only), is one component of a comprehensive MRSA colonization surveillance program. It is not intended to diagnose MRSA infection nor to guide or monitor treatment for MRSA infections. Performed at  [facility], [address], [place], [place] [address]     Procedures and diagnostic studies:  Dg Abd 2 Views  Result Date: 08/06/2019 CLINICAL DATA:  Abdominal distension. EXAM: ABDOMEN - 2 VIEW COMPARISON:  Plain films of the abdomen and CT abdomen and pelvis 08/03/2019. Plain films of the abdomen 04/27/2019. FINDINGS: Gaseous distention of the colon is unchanged. No free intraperitoneal air. No unexpected abdominal calcification. Atherosclerosis is noted. The patient is status post left hip replacement. IMPRESSION: Gaseous distention of the colon most compatible with ileus. Electronically Signed   By: Inge Rise M.D.   On: 08/06/2019 15:30    Medications:   . aspirin EC  81 mg Oral Daily  . chlorhexidine  15 mL Mouth Rinse BID  . gabapentin  600 mg Oral TID  . heparin  5,000 Units Subcutaneous Q8H  . [START ON 08/07/2019] lactulose  20 g Oral Daily  . levETIRAcetam  500 mg Oral BID  . lisinopril  7.5 mg Oral Daily  . mouth rinse  15 mL Mouth Rinse q12n4p  . metoprolol succinate  25 mg Oral Daily  . [START ON 08/07/2019] tiotropium  18 mcg Inhalation Daily   Continuous Infusions: . ampicillin (OMNIPEN) IV 1 g (08/06/19 1753)  . dextrose 5 % and 0.45% NaCl 75 mL/hr at 08/06/19 1529     LOS: 3 days   Berle Mull  [facility]   *Please refer to [email or web address], password [email or web address] to get updated schedule on who will round on this patient, as hospitalists switch teams weekly. If 7PM-7AM, please contact night-coverage at [email or web address], password [email or web address] for any overnight needs.  08/06/2019, 7:33 PM

## 2019-08-06 NOTE — Care Management Important Message (Signed)
Important Message  Patient Details  Name: Calvin [name] MRN: [id number] Date of Birth: August 03, 1951   Medicare Important Message Given:  Yes     Dannette Barbara 08/06/2019, 11:00 AM

## 2019-08-06 NOTE — Progress Notes (Signed)
RN try calling pt daughter [name] to give her an update about pt's status. Pt's daughter phone went straight to voicemail. RN left a detail message for daughter to call us back when she is available. RN will continue to monitor pt.

## 2019-08-07 ENCOUNTER — Encounter: Payer: Self-pay | Admitting: Anesthesiology

## 2019-08-07 DIAGNOSIS — K567 Ileus, unspecified: Secondary | ICD-10-CM | POA: Diagnosis not present

## 2019-08-07 LAB — COMPREHENSIVE METABOLIC PANEL
ALT: 9 U/L (ref 0–44)
AST: 13 U/L — ABNORMAL LOW (ref 15–41)
Albumin: 3.3 g/dL — ABNORMAL LOW (ref 3.5–5.0)
Alkaline Phosphatase: 48 U/L (ref 38–126)
Anion gap: 8 (ref 5–15)
BUN: 8 mg/dL (ref 8–23)
CO2: 23 mmol/L (ref 22–32)
Calcium: 8.4 mg/dL — ABNORMAL LOW (ref 8.9–10.3)
Chloride: 111 mmol/L (ref 98–111)
Creatinine, Ser: 1.02 mg/dL (ref 0.61–1.24)
GFR calc Af Amer: >60 mL/min (ref >60)
GFR calc non Af Amer: >60 mL/min (ref >60)
Glucose, Bld: 100 mg/dL — ABNORMAL HIGH (ref 70–99)
Potassium: 4.1 mmol/L (ref 3.5–5.1)
Sodium: 142 mmol/L (ref 135–145)
Total Bilirubin: 0.8 mg/dL (ref 0.3–1.2)
Total Protein: 6.8 g/dL (ref 6.5–8.1)

## 2019-08-07 LAB — CBC WITH DIFFERENTIAL/PLATELET
Abs Immature Granulocytes: 0.02 10*3/uL (ref 0.00–0.07)
Basophils Absolute: 0 10*3/uL (ref 0.0–0.1)
Basophils Relative: 0 %
Eosinophils Absolute: 0.1 10*3/uL (ref 0.0–0.5)
Eosinophils Relative: 1 %
HCT: 38.1 % — ABNORMAL LOW (ref 39.0–52.0)
Hemoglobin: 12.5 g/dL — ABNORMAL LOW (ref 13.0–17.0)
Immature Granulocytes: 1 %
Lymphocytes Relative: 23 %
Lymphs Abs: 0.8 10*3/uL (ref 0.7–4.0)
MCH: 29.8 pg (ref 26.0–34.0)
MCHC: 32.8 g/dL (ref 30.0–36.0)
MCV: 90.9 fL (ref 80.0–100.0)
Monocytes Absolute: 0.4 10*3/uL (ref 0.1–1.0)
Monocytes Relative: 10 %
Neutro Abs: 2.3 10*3/uL (ref 1.7–7.7)
Neutrophils Relative %: 65 %
Platelets: 163 10*3/uL (ref 150–400)
RBC: 4.19 MIL/uL — ABNORMAL LOW (ref 4.22–5.81)
RDW: 12.4 % (ref 11.5–15.5)
WBC: 3.5 10*3/uL — ABNORMAL LOW (ref 4.0–10.5)
nRBC: 0 % (ref 0.0–0.2)

## 2019-08-07 LAB — MAGNESIUM: Magnesium: 2 mg/dL (ref 1.7–2.4)

## 2019-08-07 LAB — AMMONIA: Ammonia: 27 umol/L (ref 9–35)

## 2019-08-07 LAB — TSH: TSH: 0.896 u[IU]/mL (ref 0.350–4.500)

## 2019-08-07 LAB — VITAMIN B12: Vitamin B-12: 396 pg/mL (ref 180–914)

## 2019-08-07 MED ORDER — ROCURONIUM BROMIDE 50 MG/5ML IV SOLN
INTRAVENOUS | Status: AC
  Filled 2019-08-07: qty 1

## 2019-08-07 MED ORDER — ONDANSETRON HCL 4 MG/2ML IJ SOLN
4.0000 mg | Freq: Four times a day (QID) | INTRAMUSCULAR | Status: DC | PRN
  Administered 2019-08-07 – 2019-08-08 (×2): 4 mg via INTRAVENOUS
  Filled 2019-08-07 (×3): qty 2

## 2019-08-07 MED ORDER — SIMETHICONE 40 MG/0.6ML PO SUSP
40.0000 mg | Freq: Once | ORAL | Status: AC
  Administered 2019-08-07: 19:00:00 40 mg via ORAL
  Filled 2019-08-07: qty 30

## 2019-08-07 MED ORDER — ONDANSETRON HCL 4 MG/2ML IJ SOLN
INTRAMUSCULAR | Status: AC
  Filled 2019-08-07: qty 2

## 2019-08-07 MED ORDER — BISACODYL 5 MG PO TBEC
10.0000 mg | DELAYED_RELEASE_TABLET | Freq: Once | ORAL | Status: AC
  Administered 2019-08-07: 10 mg via ORAL
  Filled 2019-08-07: qty 2

## 2019-08-07 MED ORDER — DEXAMETHASONE SODIUM PHOSPHATE 4 MG/ML IJ SOLN
INTRAMUSCULAR | Status: AC
  Filled 2019-08-07: qty 1

## 2019-08-07 NOTE — Progress Notes (Signed)
Vonda Antigua, MD [address] [place], [place], [place], [address] [address], [place], [place], [place], [address] Phone: [phone]  Fax: [phone]   Subjective: EGD and colonoscopy were planned for today.  But patient did not drink prep.   Objective: Exam: Vital signs in last 24 hours: Vitals:   08/06/19 2010 08/07/19 0538 08/07/19 0928 08/07/19 1132  BP: (!) 155/101 (!) 153/97 (!) 159/84 (!) 146/87  Pulse: 86 76 77 77  Resp: 18 18  16   Temp: 98.9 F (37.2 C) 98.3 F (36.8 C)  98.8 F (37.1 C)  TempSrc: Oral Oral  Oral  SpO2: 98% 100%  97%  Weight:      Height:       Weight change:   Intake/Output Summary (Last 24 hours) at 08/07/2019 1357 Last data filed at 08/07/2019 [id number] Gross per 24 hour  Intake 1820.79 ml  Output 550 ml  Net 1270.79 ml    General: No acute distress, AAO x3 Abd: Soft, NT/ND, No HSM Skin: Warm, no rashes Neck: Supple, Trachea midline   Lab Results: Lab Results  Component Value Date   WBC 3.5 (L) 08/07/2019   HGB 12.5 (L) 08/07/2019   HCT 38.1 (L) 08/07/2019   MCV 90.9 08/07/2019   PLT 163 08/07/2019   Micro Results: Recent Results (from the past 240 hour(s))  SARS CORONAVIRUS 2 (TAT 6-24 HRS) Nasopharyngeal Nasopharyngeal Swab     Status: None   Collection Time: 08/03/19  3:29 PM   Specimen: Nasopharyngeal Swab  Result Value Ref Range Status   SARS Coronavirus 2 NEGATIVE NEGATIVE Final    Comment: (NOTE) SARS-CoV-2 target nucleic acids are NOT DETECTED. The SARS-CoV-2 RNA is generally detectable in upper and lower respiratory specimens during the acute phase of infection. Negative results do not preclude SARS-CoV-2 infection, do not rule out co-infections with other pathogens, and should not be used as the sole basis for treatment or other patient management decisions. Negative results must be combined with clinical observations, patient history, and epidemiological information. The expected result is  Negative. Fact Sheet for Patients: SugarRoll.be Fact Sheet for Healthcare Providers: https://www.woods-mathews.com/ This test is not yet approved or cleared by the [place] FDA and  has been authorized for detection and/or diagnosis of SARS-CoV-2 by FDA under an Emergency Use Authorization (EUA). This EUA will remain  in effect (meaning this test can be used) for the duration of the COVID-19 declaration under Section 56 4(b)(1) of the Act, 21 U.S.C. section 360bbb-3(b)(1), unless the authorization is terminated or revoked sooner. Performed at [facility], [place] [address] [place], [place] [address]   Urine culture     Status: Abnormal   Collection Time: 08/03/19  4:09 PM   Specimen: Urine, Clean Catch  Result Value Ref Range Status   Specimen Description   Final    URINE, CLEAN CATCH Performed at [facility], [address] [place], [place] [address]    Special Requests   Final    NONE Performed at [facility], [address] [place], [place] [address]    Culture (A)  Final    >=100,000 COLONIES/mL GROUP B STREP(S.AGALACTIAE)ISOLATED TESTING AGAINST S. AGALACTIAE NOT ROUTINELY PERFORMED DUE TO PREDICTABILITY OF AMP/PEN/VAN SUSCEPTIBILITY. Performed at [facility], [place] [address] [place],  [address]    Report Status 08/04/2019 FINAL  Final  MRSA PCR Screening     Status: None   Collection Time: 08/04/19  6:35 AM   Specimen:  Nasal Mucosa; Nasopharyngeal  Result Value Ref Range Status   MRSA by PCR NEGATIVE NEGATIVE Final    Comment:        The GeneXpert MRSA Assay (FDA approved for NASAL specimens only), is one component of a comprehensive MRSA colonization surveillance program. It is not intended to diagnose MRSA infection nor to guide or monitor treatment for MRSA infections. Performed at [facility], [address], [place], [place] [address]     Studies/Results: Dg Abd 2 Views  Result Date: 08/06/2019 CLINICAL DATA:  Abdominal distension. EXAM: ABDOMEN - 2 VIEW COMPARISON:  Plain films of the abdomen and CT abdomen and pelvis 08/03/2019. Plain films of the abdomen 04/27/2019. FINDINGS: Gaseous distention of the colon is unchanged. No free intraperitoneal air. No unexpected abdominal calcification. Atherosclerosis is noted. The patient is status post left hip replacement. IMPRESSION: Gaseous distention of the colon most compatible with ileus. Electronically Signed   By: Inge Rise M.D.   On: 08/06/2019 15:30   Medications:  Scheduled Meds: . aspirin EC  81 mg Oral Daily  . chlorhexidine  15 mL Mouth Rinse BID  . gabapentin  600 mg Oral TID  . heparin  5,000 Units Subcutaneous Q8H  . lactulose  20 g Oral Daily  . levETIRAcetam  500 mg Oral BID  . lisinopril  7.5 mg Oral Daily  . mouth rinse  15 mL Mouth Rinse q12n4p  . metoprolol succinate  25 mg Oral Daily  . tiotropium  18 mcg Inhalation Daily   Continuous Infusions: . dextrose 5 % and 0.45% NaCl 75 mL/hr at 08/07/19 0600   PRN Meds:.acetaminophen, ALPRAZolam, phenol   Assessment: Principal Problem:   Ileus (HCC) Active Problems:   Acute UTI   Hypokalemia   QT prolongation    Plan: Patient willing to drink his prep today. 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  The patient agrees with this plan & written consent will be obtained.      LOS: 4 days   Vonda Antigua, MD 08/07/2019, 1:57 PM

## 2019-08-07 NOTE — Progress Notes (Signed)
Progress Note    Calvin [name]  [id number] DOB: [birth year]  DOA: 08/03/2019 PCP: Lynnell Jude, MD      Brief Narrative:    Medical records reviewed and are as summarized below:  Calvin [name] is an 68 y.o. male  with medical history significant for Olgivie syndrome, alcohol abuse, anxiety, seizure disorder, COPD, depression, hypertension, CAD, stroke, presented to the hospital with nausea, vomiting, abdominal pain and abdominal distention.    He was admitted to the hospital for ileus/colonic pseudoobstruction and probable UTI.      Assessment/Plan:   Principal Problem:   Ileus ([place]) Active Problems:   Acute UTI   Hypokalemia   QT prolongation   Body mass index is 29.64 kg/m.   Ileus/colonic pseudoobstruction:  GI and general surgery both consulted. Patient had an NG tube with significant output production was the NG tube output improved which was removed on 08/05/2019. Patient tolerated clear liquid diet after that. Now on 08/07/2019 patient has some nausea without any vomiting. Continue to remain on clear liquid diet. GI recommending colonoscopy. Continue IV fluids good As needed Zofran  Acute strep agalactiae UTI:  Treated with IV antibiotics  Hypokalemia: Improved.  Replete potassium  Mild hyponatremia:  Resolved continue IV fluids.  Prolonged QTc interval on EKG: Resolved.   QTc interval on repeat EKG was 431.  EKG showed showed sinus rhythm and first-degree AV block.  Seizure disorder: Continue Keppra   History of stroke: Aspirin and statin on hold  COPD: Stable  Acute metabolic encephalopathy. Patient appears to be significantly drowsy and lethargic at the time of my evaluation. No focal deficit. Patient is spontaneously moving all extremities. Suspect this is in the setting of polypharmacy with the use of IV Keppra as well as gabapentin as well as Xanax. We will reduce the dose of the gabapentin and change the Xanax to as  needed. We will also switch IV Keppra to oral Keppra. Monitor. Remains at risk for aspiration.    Family Communication/Anticipated D/C date and plan/Code Status   DVT prophylaxis: Heparin Code Status: Full code Family Communication: Plan discussed with the patient Disposition Plan: Possible discharge to home in 3 to 4 days depending on clinical improvement      Subjective:   More awake some nausea some cooperative no other acute complaint passing gas.  Objective:    Vitals:   08/06/19 2010 08/07/19 0538 08/07/19 0928 08/07/19 1132  BP: (!) 155/101 (!) 153/97 (!) 159/84 (!) 146/87  Pulse: 86 76 77 77  Resp: 18 18  16   Temp: 98.9 F (37.2 C) 98.3 F (36.8 C)  98.8 F (37.1 C)  TempSrc: Oral Oral  Oral  SpO2: 98% 100%  97%  Weight:      Height:        Intake/Output Summary (Last 24 hours) at 08/07/2019 1849 Last data filed at 08/07/2019 1600 Gross per 24 hour  Intake 2136.83 ml  Output 400 ml  Net 1736.83 ml   Filed Weights   08/03/19 1003 08/03/19 2218  Weight: 98.9 kg 96.4 kg    Exam:  GEN: NAD SKIN: No rash EYES: No pallor or icterus ENT: MMM, NG tube connected to intermittent low wall suction with greenish fluid noted in canister. CV: RRR PULM: CTA B  ABD: soft, distended, NT, +BS CNS: Sleepy and drowsy, not oriented x 3, non focal EXT: No edema or tenderness    Data Reviewed:   I have personally reviewed following labs  and imaging studies:  Labs: Labs show the following:   Basic Metabolic Panel: Recent Labs  Lab 08/03/19 1009 08/04/19 0520 08/05/19 0502 08/06/19 0436 08/07/19 0647  NA 143 145 146* 145 142  K 2.8* 3.2* 3.7 4.1 4.1  CL 99 105 113* 113* 111  CO2 29 28 26 25 23   GLUCOSE 106* 131* 112* 104* 100*  BUN 9 9 7* 8 8  CREATININE 1.25* 1.03 1.07 1.07 1.02  CALCIUM 9.1 8.8* 8.4* 8.6* 8.4*  MG 2.1 2.3  --  2.1 2.0  PHOS 3.5  --   --  2.3*  --    GFR Estimated Creatinine Clearance: 79.8 mL/min (by C-G formula based on SCr  of 1.02 mg/dL). Liver Function Tests: Recent Labs  Lab 08/03/19 1009 08/07/19 0647  AST 16 13*  ALT 14 9  ALKPHOS 62 48  BILITOT 0.8 0.8  PROT 7.4 6.8  ALBUMIN 4.0 3.3*   Recent Labs  Lab 08/03/19 1009  LIPASE 22   Recent Labs  Lab 08/07/19 0647  AMMONIA 27   Coagulation profile No results for input(s): INR, PROTIME in the last 168 hours.  CBC: Recent Labs  Lab 08/03/19 1009 08/04/19 0520 08/07/19 0647  WBC 6.9 5.6 3.5*  NEUTROABS 5.4  --  2.3  HGB 13.7 12.7* 12.5*  HCT 42.2 40.6 38.1*  MCV 92.1 94.9 90.9  PLT 195 191 163   Cardiac Enzymes: No results for input(s): CKTOTAL, CKMB, CKMBINDEX, TROPONINI in the last 168 hours. BNP (last 3 results) No results for input(s): PROBNP in the last 8760 hours. CBG: Recent Labs  Lab 08/06/19 1337  GLUCAP 72   D-Dimer: No results for input(s): DDIMER in the last 72 hours. Hgb A1c: No results for input(s): HGBA1C in the last 72 hours. Lipid Profile: No results for input(s): CHOL, HDL, LDLCALC, TRIG, CHOLHDL, LDLDIRECT in the last 72 hours. Thyroid function studies: Recent Labs    08/07/19 0647  TSH 0.896   Anemia work up: Recent Labs    08/07/19 0647  VITAMINB12 396   Sepsis Labs: Recent Labs  Lab 08/03/19 1009 08/03/19 1347 08/04/19 0520 08/07/19 0647  WBC 6.9  --  5.6 3.5*  LATICACIDVEN  --  0.9  --   --     Microbiology Recent Results (from the past 240 hour(s))  SARS CORONAVIRUS 2 (TAT 6-24 HRS) Nasopharyngeal Nasopharyngeal Swab     Status: None   Collection Time: 08/03/19  3:29 PM   Specimen: Nasopharyngeal Swab  Result Value Ref Range Status   SARS Coronavirus 2 NEGATIVE NEGATIVE Final    Comment: (NOTE) SARS-CoV-2 target nucleic acids are NOT DETECTED. The SARS-CoV-2 RNA is generally detectable in upper and lower respiratory specimens during the acute phase of infection. Negative results do not preclude SARS-CoV-2 infection, do not rule out co-infections with other pathogens, and  should not be used as the sole basis for treatment or other patient management decisions. Negative results must be combined with clinical observations, patient history, and epidemiological information. The expected result is Negative. Fact Sheet for Patients: SugarRoll.be Fact Sheet for Healthcare Providers: https://www.woods-mathews.com/ This test is not yet approved or cleared by the [place] FDA and  has been authorized for detection and/or diagnosis of SARS-CoV-2 by FDA under an Emergency Use Authorization (EUA). This EUA will remain  in effect (meaning this test can be used) for the duration of the COVID-19 declaration under Section 56 4(b)(1) of the Act, 21 U.S.C. section 360bbb-3(b)(1), unless the authorization is terminated  or revoked sooner. Performed at [facility], [place] [address] [place], [place] [address]   Urine culture     Status: Abnormal   Collection Time: 08/03/19  4:09 PM   Specimen: Urine, Clean Catch  Result Value Ref Range Status   Specimen Description   Final    URINE, CLEAN CATCH Performed at [facility], [address], [place], [place] [address]    Special Requests   Final    NONE Performed at [facility], [address] [place], [place] [address]    Culture (A)  Final    >=100,000 COLONIES/mL GROUP B STREP(S.AGALACTIAE)ISOLATED TESTING AGAINST S. AGALACTIAE NOT ROUTINELY PERFORMED DUE TO PREDICTABILITY OF AMP/PEN/VAN SUSCEPTIBILITY. Performed at [facility], [place] [address] [place],  [address]    Report Status 08/04/2019 FINAL  Final  MRSA PCR Screening     Status: None   Collection Time: 08/04/19  6:35 AM   Specimen: Nasal Mucosa; Nasopharyngeal  Result Value Ref Range Status   MRSA by PCR NEGATIVE NEGATIVE Final    Comment:        The GeneXpert MRSA Assay (FDA approved for NASAL specimens only), is one component of a comprehensive MRSA  colonization surveillance program. It is not intended to diagnose MRSA infection nor to guide or monitor treatment for MRSA infections. Performed at [facility], [address], [place],  [address]     Procedures and diagnostic studies:  Dg Abd 2 Views  Result Date: 08/06/2019 CLINICAL DATA:  Abdominal distension. EXAM: ABDOMEN - 2 VIEW COMPARISON:  Plain films of the abdomen and CT abdomen and pelvis 08/03/2019. Plain films of the abdomen 04/27/2019. FINDINGS: Gaseous distention of the colon is unchanged. No free intraperitoneal air. No unexpected abdominal calcification. Atherosclerosis is noted. The patient is status post left hip replacement. IMPRESSION: Gaseous distention of the colon most compatible with ileus. Electronically Signed   By: Inge Rise M.D.   On: 08/06/2019 15:30    Medications:   . aspirin EC  81 mg Oral Daily  . chlorhexidine  15 mL Mouth Rinse BID  . gabapentin  600 mg Oral TID  . heparin  5,000 Units Subcutaneous Q8H  . lactulose  20 g Oral Daily  . levETIRAcetam  500 mg Oral BID  . lisinopril  7.5 mg Oral Daily  . mouth rinse  15 mL Mouth Rinse q12n4p  . metoprolol succinate  25 mg Oral Daily  . tiotropium  18 mcg Inhalation Daily   Continuous Infusions: . dextrose 5 % and 0.45% NaCl 75 mL/hr at 08/07/19 1801     LOS: 4 days   Berle Mull  [facility]   *Please refer to [email or web address], password [email or web address] to get updated schedule on who will round on this patient, as hospitalists switch teams weekly. If 7PM-7AM, please contact night-coverage at [email or web address], password [email or web address] for any overnight needs.  08/07/2019, 6:49 PM

## 2019-08-07 NOTE — Anesthesia Preprocedure Evaluation (Deleted)
Anesthesia Evaluation  Patient identified by MRN, date of birth, ID band Patient awake    Reviewed: Allergy & Precautions, H&P , NPO status , Patient's Chart, lab work & pertinent test results  Airway        Dental   Pulmonary COPD, Current Smoker,           Cardiovascular hypertension, + CAD and + Past MI    Troponin on admission WNL Prolonged QTc on admission in setting of hypokalemia and Keppra, resolved following K supplementation   Neuro/Psych Seizures -,  PSYCHIATRIC DISORDERS Anxiety Depression AMS on admission, attributed to polypharmacy CVA    GI/Hepatic (+)     substance abuse  alcohol use, pseudoobstruction with high NG output   Endo/Other  negative endocrine ROS  Renal/GU negative Renal ROS  negative genitourinary   Musculoskeletal   Abdominal   Peds  Hematology negative hematology ROS (+)   Anesthesia Other Findings Past Medical History: No date: Alcohol abuse     Comment:  drinks on weekend No date: Anemia No date: Anxiety No date: Arthritis No date: Cancer ([place])     Comment:  colon,throat No date: COPD (chronic obstructive pulmonary disease) (HCC) No date: Coronary artery disease No date: Depression No date: Gout No date: Hypertension No date: Myocardial infarction ([place]) No date: Neuromuscular disorder ([place]) No date: Seizures ([place])     Comment:  last 6 months ago No date: Stroke [facility])     Comment:  multiple  left side weakness No date: Tremors of nervous system  Past Surgical History: 10/19/2015: CARPAL TUNNEL RELEASE; Left     Comment:  Procedure: CARPAL TUNNEL RELEASE;  Surgeon: Hessie Knows, MD;  Location: [facility] ORS;  Service: Orthopedics;                Laterality: Left; No date: COLON SURGERY No date: JOINT REPLACEMENT     Comment:  left partial hip  2013: THROAT SURGERY     Comment:  cancer  BMI    Body Mass Index: 29.64 kg/m       Reproductive/Obstetrics negative OB ROS                             Anesthesia Physical Anesthesia Plan  ASA: IV  Anesthesia Plan: General ETT and Rapid Sequence   Post-op Pain Management:    Induction:   PONV Risk Score and Plan:   Airway Management Planned:   Additional Equipment:   Intra-op Plan:   Post-operative Plan:   Informed Consent: I have reviewed the patients History and Physical, chart, labs and discussed the procedure including the risks, benefits and alternatives for the proposed anesthesia with the patient or authorized representative who has indicated his/her understanding and acceptance.     Dental Advisory Given  Plan Discussed with:   Anesthesia Plan Comments:         Anesthesia Quick Evaluation

## 2019-08-07 NOTE — Progress Notes (Signed)
Patient is doing well today.  He did not drink the prep for the colonoscopy last night so the procedure was canceled for today.  The MD talked to him today about it and he said he will try.  Bowel sounds hypoactive but patient states he is passing gas.  No significant changes.

## 2019-08-08 ENCOUNTER — Inpatient Hospital Stay: Payer: Medicare Other

## 2019-08-08 ENCOUNTER — Inpatient Hospital Stay: Admit: 2019-08-08 | Payer: Medicare Other

## 2019-08-08 ENCOUNTER — Encounter
Admission: EM | Disposition: A | Discharge status: Skilled Nursing Facility | Payer: Self-pay | Source: Skilled Nursing Facility | Attending: Internal Medicine

## 2019-08-08 DIAGNOSIS — K567 Ileus, unspecified: Secondary | ICD-10-CM | POA: Diagnosis not present

## 2019-08-08 DIAGNOSIS — R531 Weakness: Secondary | ICD-10-CM | POA: Diagnosis not present

## 2019-08-08 LAB — CBC WITH DIFFERENTIAL/PLATELET
Abs Immature Granulocytes: 0.02 10*3/uL (ref 0.00–0.07)
Basophils Absolute: 0 10*3/uL (ref 0.0–0.1)
Basophils Relative: 1 %
Eosinophils Absolute: 0.1 10*3/uL (ref 0.0–0.5)
Eosinophils Relative: 1 %
HCT: 38 % — ABNORMAL LOW (ref 39.0–52.0)
Hemoglobin: 12.6 g/dL — ABNORMAL LOW (ref 13.0–17.0)
Immature Granulocytes: 1 %
Lymphocytes Relative: 30 %
Lymphs Abs: 1.2 10*3/uL (ref 0.7–4.0)
MCH: 29.6 pg (ref 26.0–34.0)
MCHC: 33.2 g/dL (ref 30.0–36.0)
MCV: 89.2 fL (ref 80.0–100.0)
Monocytes Absolute: 0.4 10*3/uL (ref 0.1–1.0)
Monocytes Relative: 9 %
Neutro Abs: 2.4 10*3/uL (ref 1.7–7.7)
Neutrophils Relative %: 58 %
Platelets: 172 10*3/uL (ref 150–400)
RBC: 4.26 MIL/uL (ref 4.22–5.81)
RDW: 12.4 % (ref 11.5–15.5)
WBC: 4.1 10*3/uL (ref 4.0–10.5)
nRBC: 0 % (ref 0.0–0.2)

## 2019-08-08 LAB — COMPREHENSIVE METABOLIC PANEL
ALT: 13 U/L (ref 0–44)
AST: 16 U/L (ref 15–41)
Albumin: 3.5 g/dL (ref 3.5–5.0)
Alkaline Phosphatase: 51 U/L (ref 38–126)
Anion gap: 11 (ref 5–15)
BUN: 8 mg/dL (ref 8–23)
CO2: 22 mmol/L (ref 22–32)
Calcium: 8.8 mg/dL — ABNORMAL LOW (ref 8.9–10.3)
Chloride: 108 mmol/L (ref 98–111)
Creatinine, Ser: 1.11 mg/dL (ref 0.61–1.24)
GFR calc Af Amer: >60 mL/min (ref >60)
GFR calc non Af Amer: >60 mL/min (ref >60)
Glucose, Bld: 83 mg/dL (ref 70–99)
Potassium: 4 mmol/L (ref 3.5–5.1)
Sodium: 141 mmol/L (ref 135–145)
Total Bilirubin: 0.7 mg/dL (ref 0.3–1.2)
Total Protein: 7 g/dL (ref 6.5–8.1)

## 2019-08-08 LAB — GLUCOSE, CAPILLARY: Glucose-Capillary: 82 mg/dL (ref 70–99)

## 2019-08-08 LAB — SARS CORONAVIRUS 2 BY RT PCR (HOSPITAL ORDER, PERFORMED IN ~~LOC~~ HOSPITAL LAB): SARS Coronavirus 2: NEGATIVE

## 2019-08-08 LAB — MAGNESIUM: Magnesium: 1.9 mg/dL (ref 1.7–2.4)

## 2019-08-08 SURGERY — COLONOSCOPY WITH PROPOFOL
Anesthesia: General

## 2019-08-08 MED ORDER — ASPIRIN 325 MG PO TABS
325.0000 mg | ORAL_TABLET | Freq: Every day | ORAL | Status: DC
  Administered 2019-08-08 – 2019-08-14 (×7): 325 mg via ORAL
  Filled 2019-08-08 (×7): qty 1

## 2019-08-08 MED ORDER — ASPIRIN 300 MG RE SUPP
300.0000 mg | Freq: Once | RECTAL | Status: DC
  Filled 2019-08-08: qty 1

## 2019-08-08 MED ORDER — ASPIRIN 300 MG RE SUPP
300.0000 mg | Freq: Every day | RECTAL | Status: DC
  Filled 2019-08-08: qty 1

## 2019-08-08 MED ORDER — SODIUM CHLORIDE 0.9 % IV SOLN
INTRAVENOUS | Status: DC
  Administered 2019-08-08 – 2019-08-09 (×2): via INTRAVENOUS

## 2019-08-08 MED ORDER — STROKE: EARLY STAGES OF RECOVERY BOOK
Freq: Once | Status: AC
  Administered 2019-08-08: 19:00:00

## 2019-08-08 MED ORDER — PROMETHAZINE HCL 25 MG/ML IJ SOLN
12.5000 mg | Freq: Once | INTRAMUSCULAR | Status: AC
  Administered 2019-08-08: 12.5 mg via INTRAVENOUS
  Filled 2019-08-08: qty 1

## 2019-08-08 NOTE — Progress Notes (Signed)
Progress Note    Calvin [name]  [id number] DOB: 1951/01/10  DOA: 08/03/2019 PCP: Lynnell Jude, MD      Brief Narrative:    Medical records reviewed and are as summarized below:  Calvin [name] is an 68 y.o. male  with medical history significant for Olgivie syndrome, alcohol abuse, anxiety, seizure disorder, COPD, depression, hypertension, CAD, stroke, presented to the hospital with nausea, vomiting, abdominal pain and abdominal distention.    He was admitted to the hospital for ileus/colonic pseudoobstruction and probable UTI.      Assessment/Plan:   Principal Problem:   Ileus ([place]) Active Problems:   Acute UTI   Hypokalemia   QT prolongation   Body mass index is 29.64 kg/m.   Ileus/colonic pseudoobstruction:  GI and general surgery both consulted. Patient had an NG tube with significant output production was the NG tube output improved which was removed on 08/05/2019. Patient tolerated clear liquid diet after that. Now on 08/07/2019 patient has some nausea without any vomiting. Continue to remain on clear liquid diet. GI recommending colonoscopy which can be considered at a later time when he is further out from his acute episodes Follow up in GI clinic in 2-4 weeks As needed Zofran  Acute strep agalactiae UTI:  Treated with IV antibiotics  Hypokalemia: Improved.  Replete potassium  Mild hyponatremia:  Resolved continue IV fluids.  Prolonged QTc interval on EKG: Resolved.   QTc interval on repeat EKG was 431.  EKG showed showed sinus rhythm and first-degree AV block.  Seizure disorder: Continue Keppra   History of stroke: Aspirin and statin on hold  COPD: Stable  Acute metabolic encephalopathy. Patient appears to be significantly drowsy and lethargic at the time of my evaluation. No focal deficit. Patient is spontaneously moving all extremities. Suspect this is in the setting of polypharmacy with the use of IV Keppra as well as gabapentin  as well as Xanax. We will reduce the dose of the gabapentin and change the Xanax to as needed. We will also switch IV Keppra to oral Keppra. Monitor. Remains at risk for aspiration.   Weakness. TIA versus acute CVA. Code stroke was called. Neurology will the patient. Based on the evaluation it appears that the patient actually has prior CVA with corresponding weakness on exam. Currently neurology recommending MRI brain and complete stroke work-up pending on the MRI brain. Appreciate their assistance. Continue with aspirin and telemetry monitoring as well as NIH. Speech therapy recommends no concern for difficulty swallowing. We will advance to clear liquid diet for now.   Family Communication/Anticipated D/C date and plan/Code Status   DVT prophylaxis: Heparin Code Status: Full code Family Communication: Plan discussed with the patient Disposition Plan: Possible discharge to home in 3 to 4 days depending on clinical improvement      Subjective:   Reports left-sided vision changes as well as left-sided generalized weakness.  No chest pain abdominal pain.  Had a bowel movement.  Objective:    Vitals:   08/08/19 0349 08/08/19 1017 08/08/19 1147 08/08/19 1234  BP: (!) 147/98 138/89 (!) 152/89 (!) 135/91  Pulse: 89 78 77 74  Resp: 20  16 18   Temp: 98.2 F (36.8 C) 98.5 F (36.9 C) 98.7 F (37.1 C) 98.9 F (37.2 C)  TempSrc: Oral Oral Oral Oral  SpO2: 97% 99% 96% 97%  Weight:      Height:        Intake/Output Summary (Last 24 hours) at 08/08/2019 [id number]  Last data filed at 08/08/2019 1610 Gross per 24 hour  Intake 1682.85 ml  Output 470 ml  Net 1212.85 ml   Filed Weights   08/03/19 1003 08/03/19 2218  Weight: 98.9 kg 96.4 kg    Exam:  GEN: NAD SKIN: No rash EYES: No pallor or icterus ENT: MMM, NG tube connected to intermittent low wall suction with greenish fluid noted in canister. CV: RRR PULM: CTA B  ABD: soft, distended, NT, +BS CNS: Left-sided  weakness.  Left-sided vision changes as well. EXT: No edema or tenderness    Data Reviewed:   I have personally reviewed following labs and imaging studies:  Labs: Labs show the following:   Basic Metabolic Panel: Recent Labs  Lab 08/03/19 1009 08/04/19 0520 08/05/19 0502 08/06/19 0436 08/07/19 0647 08/08/19 0830  NA 143 145 146* 145 142 141  K 2.8* 3.2* 3.7 4.1 4.1 4.0  CL 99 105 113* 113* 111 108  CO2 29 28 26 25 23 22   GLUCOSE 106* 131* 112* 104* 100* 83  BUN 9 9 7* 8 8 8   CREATININE 1.25* 1.03 1.07 1.07 1.02 1.11  CALCIUM 9.1 8.8* 8.4* 8.6* 8.4* 8.8*  MG 2.1 2.3  --  2.1 2.0 1.9  PHOS 3.5  --   --  2.3*  --   --    GFR Estimated Creatinine Clearance: 73.3 mL/min (by C-G formula based on SCr of 1.11 mg/dL). Liver Function Tests: Recent Labs  Lab 08/03/19 1009 08/07/19 0647 08/08/19 0830  AST 16 13* 16  ALT 14 9 13   ALKPHOS 62 48 51  BILITOT 0.8 0.8 0.7  PROT 7.4 6.8 7.0  ALBUMIN 4.0 3.3* 3.5   Recent Labs  Lab 08/03/19 1009  LIPASE 22   Recent Labs  Lab 08/07/19 0647  AMMONIA 27   Coagulation profile No results for input(s): INR, PROTIME in the last 168 hours.  CBC: Recent Labs  Lab 08/03/19 1009 08/04/19 0520 08/07/19 0647 08/08/19 0830  WBC 6.9 5.6 3.5* 4.1  NEUTROABS 5.4  --  2.3 2.4  HGB 13.7 12.7* 12.5* 12.6*  HCT 42.2 40.6 38.1* 38.0*  MCV 92.1 94.9 90.9 89.2  PLT 195 191 163 172   Cardiac Enzymes: No results for input(s): CKTOTAL, CKMB, CKMBINDEX, TROPONINI in the last 168 hours. BNP (last 3 results) No results for input(s): PROBNP in the last 8760 hours. CBG: Recent Labs  Lab 08/06/19 1337 08/08/19 1010  GLUCAP 72 82   D-Dimer: No results for input(s): DDIMER in the last 72 hours. Hgb A1c: No results for input(s): HGBA1C in the last 72 hours. Lipid Profile: No results for input(s): CHOL, HDL, LDLCALC, TRIG, CHOLHDL, LDLDIRECT in the last 72 hours. Thyroid function studies: Recent Labs    08/07/19 0647  TSH 0.896    Anemia work up: Recent Labs    08/07/19 0647  VITAMINB12 396   Sepsis Labs: Recent Labs  Lab 08/03/19 1009 08/03/19 1347 08/04/19 0520 08/07/19 0647 08/08/19 0830  WBC 6.9  --  5.6 3.5* 4.1  LATICACIDVEN  --  0.9  --   --   --     Microbiology Recent Results (from the past 240 hour(s))  SARS CORONAVIRUS 2 (TAT 6-24 HRS) Nasopharyngeal Nasopharyngeal Swab     Status: None   Collection Time: 08/03/19  3:29 PM   Specimen: Nasopharyngeal Swab  Result Value Ref Range Status   SARS Coronavirus 2 NEGATIVE NEGATIVE Final    Comment: (NOTE) SARS-CoV-2 target nucleic acids are NOT DETECTED.  The SARS-CoV-2 RNA is generally detectable in upper and lower respiratory specimens during the acute phase of infection. Negative results do not preclude SARS-CoV-2 infection, do not rule out co-infections with other pathogens, and should not be used as the sole basis for treatment or other patient management decisions. Negative results must be combined with clinical observations, patient history, and epidemiological information. The expected result is Negative. Fact Sheet for Patients: SugarRoll.be Fact Sheet for Healthcare Providers: https://www.woods-mathews.com/ This test is not yet approved or cleared by the [place] FDA and  has been authorized for detection and/or diagnosis of SARS-CoV-2 by FDA under an Emergency Use Authorization (EUA). This EUA will remain  in effect (meaning this test can be used) for the duration of the COVID-19 declaration under Section 56 4(b)(1) of the Act, 21 U.S.C. section 360bbb-3(b)(1), unless the authorization is terminated or revoked sooner. Performed at [facility], [place] [address] [place], [place] [address]   Urine culture     Status: Abnormal   Collection Time: 08/03/19  4:09 PM   Specimen: Urine, Clean Catch  Result Value Ref Range Status   Specimen Description   Final    URINE, CLEAN  CATCH Performed at [facility], [address] [place], [place] [address]    Special Requests   Final    NONE Performed at [facility], [address] [place], [place] [address]    Culture (A)  Final    >=100,000 COLONIES/mL GROUP B STREP(S.AGALACTIAE)ISOLATED TESTING AGAINST S. AGALACTIAE NOT ROUTINELY PERFORMED DUE TO PREDICTABILITY OF AMP/PEN/VAN SUSCEPTIBILITY. Performed at [facility], [place] [address], [place], [place] [address]    Report Status 08/04/2019 FINAL  Final  MRSA PCR Screening     Status: None   Collection Time: 08/04/19  6:35 AM   Specimen: Nasal Mucosa; Nasopharyngeal  Result Value Ref Range Status   MRSA by PCR NEGATIVE NEGATIVE Final    Comment:        The GeneXpert MRSA Assay (FDA approved for NASAL specimens only), is one component of a comprehensive MRSA colonization surveillance program. It is not intended to diagnose MRSA infection nor to guide or monitor treatment for MRSA infections. Performed at [facility], [address] [place], [place] [address]   SARS Coronavirus 2 by RT PCR (hospital order, performed in [facility] hospital lab) Nasopharyngeal Nasopharyngeal Swab     Status: None   Collection Time: 08/08/19  1:21 PM   Specimen: Nasopharyngeal Swab  Result Value Ref Range Status   SARS Coronavirus 2 NEGATIVE NEGATIVE Final    Comment: (NOTE) SARS-CoV-2 target nucleic acids are NOT DETECTED. The SARS-CoV-2 RNA is generally detectable in upper and lower respiratory specimens during the acute phase of infection. The lowest concentration of SARS-CoV-2 viral copies this assay can detect is 250 copies / mL. A negative result does not preclude SARS-CoV-2 infection and should not be used as the sole basis for treatment or other patient management decisions.  A negative result may occur with improper specimen collection / handling, submission of specimen other than nasopharyngeal swab, presence of viral  mutation(s) within the areas targeted by this assay, and inadequate number of viral copies (<250 copies / mL). A negative result must be combined with clinical observations, patient history, and epidemiological information. Fact Sheet for Patients:   StrictlyIdeas.no Fact Sheet for Healthcare Providers: BankingDealers.co.za This test is not yet approved or cleared  by the [place] FDA and has been authorized for detection  and/or diagnosis of SARS-CoV-2 by FDA under an Emergency Use Authorization (EUA).  This EUA will remain in effect (meaning this test can be used) for the duration of the COVID-19 declaration under Section 564(b)(1) of the Act, 21 U.S.C. section 360bbb-3(b)(1), unless the authorization is terminated or revoked sooner. Performed at [facility], [address], [place], [place] [address]     Procedures and diagnostic studies:  Dg Abd Portable 1v  Result Date: 08/08/2019 CLINICAL DATA:  Abdominal distension. EXAM: PORTABLE ABDOMEN - 1 VIEW COMPARISON:  08/06/2019 FINDINGS: Persistent mildly dilated air-filled colon along with scattered nondilated air-filled small bowel loops. Findings suggest a colonic ileus. No free air is identified. The lung bases are grossly clear. The bony structures are unremarkable. Left hip prosthesis noted with adjacent heterotopic ossification. Stable vascular calcifications. IMPRESSION: Colonic ileus bowel gas pattern without findings for obstruction or perforation. Electronically Signed   By: Marijo Sanes M.D.   On: 08/08/2019 08:55    Medications:   . aspirin  300 mg Rectal Daily   Or  . aspirin  325 mg Oral Daily  . chlorhexidine  15 mL Mouth Rinse BID  . gabapentin  600 mg Oral TID  . heparin  5,000 Units Subcutaneous Q8H  . lactulose  20 g Oral Daily  . levETIRAcetam  500 mg Oral BID  . mouth rinse  15 mL Mouth Rinse q12n4p  . tiotropium  18 mcg Inhalation Daily    Continuous Infusions: . sodium chloride 100 mL/hr at 08/08/19 1610     LOS: 5 days   Berle Mull  [facility]   *Please refer to [email or web address], password [email or web address] to get updated schedule on who will round on this patient, as hospitalists switch teams weekly. If 7PM-7AM, please contact night-coverage at [email or web address], password [email or web address] for any overnight needs.  08/08/2019, 7:15 PM

## 2019-08-08 NOTE — Progress Notes (Signed)
CODE STROKE- PHARMACY COMMUNICATION   Time CODE STROKE called/page received:12/4  1228  Time response to CODE STROKE was made (in person or via phone): 1 minute. Floor Pharmacist there at door already  Time Stroke Kit retrieved from [place] (only if needed):n/a  Name of Provider/Nurse contacted:   Past Medical History:  Diagnosis Date  . Alcohol abuse    drinks on weekend  . Anemia   . Anxiety   . Arthritis   . Cancer ([place])    colon,throat  . COPD (chronic obstructive pulmonary disease) ([place])   . Coronary artery disease   . Depression   . Gout   . Hypertension   . Myocardial infarction ([place])   . Neuromuscular disorder ([place])   . Seizures ([place])    last 6 months ago  . Stroke [facility])    multiple  left side weakness  . Tremors of nervous system    Prior to Admission medications   Medication Sig Start Date End Date Taking? Authorizing Provider  acetaminophen (TYLENOL) 650 MG CR tablet Take 650 mg by mouth every 6 (six) hours.    Yes [provider]  ALPRAZolam (XANAX) 0.5 MG tablet Take 1 tablet (0.5 mg total) by mouth 3 (three) times daily. 10/02/17  Yes Max Sane, MD  alum & mag hydroxide-simeth (MYLANTA) 200-200-20 MG/5ML suspension Take 30 mLs by mouth every 12 (twelve) hours as needed.    Yes [provider]  aspirin 81 MG tablet Take 81 mg by mouth daily.   Yes [provider]  bisacodyl (DULCOLAX) 10 MG suppository Place 10 mg rectally daily.   Yes [provider]  Dextromethorphan-Benzocaine (CEPACOL SORE THROAT & COUGH) 5-7.5 MG LOZG Use as directed 1 lozenge in the mouth or throat every 2 (two) hours as needed (sore throat).   Yes [provider]  ergocalciferol (VITAMIN D2) 50000 units capsule Take 50,000 Units by mouth every 30 (thirty) days.   Yes [provider]  furosemide (LASIX) 40 MG tablet Take 60 mg by mouth daily.    Yes [provider]  gabapentin (NEURONTIN) 400 MG capsule Take 2 capsules (800 mg  total) by mouth 3 (three) times daily. 10/02/17  Yes Max Sane, MD  lactulose (CHRONULAC) 10 GM/15ML solution Take 20 g by mouth daily.   Yes [provider]  levETIRAcetam (KEPPRA) 500 MG tablet Take 500 mg by mouth 2 (two) times daily.    Yes [provider]  lisinopril (PRINIVIL,ZESTRIL) 5 MG tablet Take 7.5 mg by mouth daily.   Yes [provider]  metoprolol succinate (TOPROL-XL) 25 MG 24 hr tablet Take 25 mg by mouth daily.   Yes [provider]  nicotine polacrilex (NICORETTE) 2 MG gum Take 2 mg by mouth every 4 (four) hours as needed for smoking cessation.   Yes [provider]  nitroGLYCERIN (NITROSTAT) 0.4 MG SL tablet Place 0.4 mg under the tongue every 5 (five) minutes as needed for chest pain.   Yes [provider]  nystatin (NYSTATIN) powder Apply topically 2 (two) times daily. Groin rash   Yes [provider]  ondansetron (ZOFRAN) 4 MG tablet Take 4 mg by mouth every 8 (eight) hours as needed for nausea or vomiting.   Yes [provider]  pantoprazole (PROTONIX) 20 MG tablet Take 20 mg by mouth daily.   Yes [provider]  polyethylene glycol (MIRALAX / GLYCOLAX) packet Take 17 g by mouth daily.    Yes [provider]  sertraline (  ZOLOFT) 25 MG tablet Take 25 mg by mouth daily.   Yes [provider]  simvastatin (ZOCOR) 10 MG tablet Take 10 mg by mouth at bedtime.    Yes [provider]  tiotropium (SPIRIVA) 18 MCG inhalation capsule Place 18 mcg into inhaler and inhale daily.   Yes [provider]  trolamine salicylate (ASPERCREME) 10 % cream Apply 1 application topically 3 (three) times daily as needed. To neck    Yes [provider]  Wheat Dextrin (BENEFIBER DRINK MIX PO) Take 5 g by mouth daily.   Yes [provider]    Noralee Space ,PharmD Clinical Pharmacist  08/08/2019  12:41 PM

## 2019-08-08 NOTE — NC FL2 (Signed)
[place] LEVEL OF CARE SCREENING TOOL     IDENTIFICATION  [name] Birthdate: June 23, 1951 Sex: male Admission Date (Current Location): 08/03/2019  [facility] and [place] Number:  Engineering geologist and Address:         Provider Number: [id number]  Attending Physician Name and Address:  Lavina Hamman, MD  Relative Name and Phone Number:       Current Level of Care: Hospital Recommended Level of Care: [place] Prior Approval Number:    Date Approved/Denied:   PASRR Number: [id number]  Discharge Plan: SNF    Current Diagnoses: Patient Active Problem List   Diagnosis Date Noted  . Acute UTI 08/03/2019  . Ileus ([place]) 08/03/2019  . Hypokalemia 08/03/2019  . QT prolongation 08/03/2019  . Ogilvie's syndrome   . Abdominal pain 04/03/2019  . Left-sided weakness 09/30/2017  . Seizures ([place]) 09/30/2017  . HTN (hypertension) 09/30/2017  . CAD (coronary artery disease) 09/30/2017  . COPD (chronic obstructive pulmonary disease) ([place]) 09/30/2017  . Depression with anxiety 09/30/2017    Orientation RESPIRATION BLADDER Height & Weight     Self  Normal Continent Weight: 96.4 kg Height:  5\' 11"  (180.3 cm)  BEHAVIORAL SYMPTOMS/MOOD NEUROLOGICAL BOWEL NUTRITION STATUS      Incontinent Diet(NPO will advance prior to discharge)  AMBULATORY STATUS COMMUNICATION OF NEEDS Skin   Extensive Assist Verbally Normal                       Personal Care Assistance Level of Assistance              Functional Limitations Info             SPECIAL CARE FACTORS FREQUENCY  PT (By licensed PT)                    Contractures Contractures Info: Not present    Additional Factors Info  Code Status, Allergies Code Status Info: FULL Allergies Info: NKDA           Current Medications (08/08/2019):  This is the current hospital active medication list Current Facility-Administered Medications  Medication Dose  Route Frequency Provider Last Rate Last Dose  .  stroke: mapping our early stages of recovery book   Does not apply Once Lavina Hamman, MD      . 0.9 %  sodium chloride infusion   Intravenous Continuous Lavina Hamman, MD 100 mL/hr at 08/08/19 1610    . acetaminophen (TYLENOL) tablet 650 mg  650 mg Oral Q4H PRN Mansy, Jan A, MD   650 mg at 08/08/19 1022  . ALPRAZolam Duanne Moron) tablet 0.5 mg  0.5 mg Oral TID PRN Lavina Hamman, MD   0.5 mg at 08/06/19 2212  . aspirin suppository 300 mg  300 mg Rectal Daily Lavina Hamman, MD       Or  . aspirin tablet 325 mg  325 mg Oral Daily Lavina Hamman, MD   325 mg at 08/08/19 1546  . chlorhexidine (PERIDEX) 0.12 % solution 15 mL  15 mL Mouth Rinse BID Jennye Boroughs, MD   15 mL at 08/08/19 1021  . gabapentin (NEURONTIN) capsule 600 mg  600 mg Oral TID Lavina Hamman, MD   600 mg at 08/08/19 1543  . heparin injection 5,000 Units  5,000 Units Subcutaneous Q8H Jennye Boroughs, MD   5,000 Units at 08/08/19 1543  . lactulose (CHRONULAC) 10 GM/15ML solution  20 g  20 g Oral Daily Lavina Hamman, MD   20 g at 08/08/19 1021  . levETIRAcetam (KEPPRA) tablet 500 mg  500 mg Oral BID Lavina Hamman, MD   500 mg at 08/08/19 1022  . MEDLINE mouth rinse  15 mL Mouth Rinse q12n4p Jennye Boroughs, MD   15 mL at 08/08/19 1544  . ondansetron (ZOFRAN) injection 4 mg  4 mg Intravenous Q6H PRN Lavina Hamman, MD   4 mg at 08/08/19 0300  . phenol (CHLORASEPTIC) mouth spray 1 spray  1 spray Mouth/Throat PRN Jennye Boroughs, MD   1 spray at 08/04/19 [id number]  . tiotropium (SPIRIVA) inhalation capsule ([facility] use ONLY) 18 mcg  18 mcg Inhalation Daily Lavina Hamman, MD   18 mcg at 08/08/19 1023     Discharge Medications: Please see discharge summary for a list of discharge medications.  Relevant Imaging Results:  Relevant Lab Results:   Additional Information SS#: [id number]  Beverly Sessions, RN

## 2019-08-08 NOTE — Progress Notes (Signed)
[place] responded to code that occurred at 1228 PM for pt. This is the second code for the pt (1010 AM for the first). Ch checked in w/ RRT and pt's provider. The writer checked in with pt's nurse. Pt is presenting behavior that is stroke-like which has been concluded to be the pt's baseline.  No further needs at this time.    08/08/19 1200  Clinical Encounter Type  Visited With Patient;Health care provider  Visit Type Code  Referral From Nurse  Consult/Referral To Chaplain  Stress Factors  Patient Stress Factors Health changes;Loss of control  Family Stress Factors None identified

## 2019-08-08 NOTE — Care Management Important Message (Signed)
Important Message  Patient Details  Name: Calvin [name] MRN: [id number] Date of Birth: 1950-10-25   Medicare Important Message Given:  Yes     Beverly Sessions, RN 08/08/2019, 3:47 PM

## 2019-08-08 NOTE — Consult Note (Signed)
Referring Physician: Posey Pronto    Chief Complaint: Left sided pain  HPI: Calvin [name] is an 68 y.o. male with a history of stroke, seizures and left sided pain who this AM had complaint of left facial pain.  Remainder of exam was baseline.  Later today reported left sided pain.  Was also noted to have left visual field disturbance.  Code stroke was called at that time.  With further conversation with patient he reports that his left sided findings have been present for some time but were bothering him today.  NIHSS of 2.  Unclear LKW.    Date last known well: Unable to determine Time last known well: Unable to determine tPA Given: No: Unable to determine LKW  Past Medical History:  Diagnosis Date  . Alcohol abuse    drinks on weekend  . Anemia   . Anxiety   . Arthritis   . Cancer ([place])    colon,throat  . COPD (chronic obstructive pulmonary disease) ([place])   . Coronary artery disease   . Depression   . Gout   . Hypertension   . Myocardial infarction ([place])   . Neuromuscular disorder ([place])   . Seizures ([place])    last 6 months ago  . Stroke [facility])    multiple  left side weakness  . Tremors of nervous system     Past Surgical History:  Procedure Laterality Date  . CARPAL TUNNEL RELEASE Left 10/19/2015   Procedure: CARPAL TUNNEL RELEASE;  Surgeon: Hessie Knows, MD;  Location: [facility] ORS;  Service: Orthopedics;  Laterality: Left;  . COLON SURGERY    . JOINT REPLACEMENT     left partial hip   . THROAT SURGERY  2013   cancer    Family History  Problem Relation Age of Onset  . Cancer Mother   . Hypertension Father   . Leukemia Brother    Social History:  reports that he has been smoking. He has never used smokeless tobacco. He reports current alcohol use. He reports that he does not use drugs.  Allergies: No Known Allergies  Medications:  I have reviewed the patient's current medications. Prior to Admission:  Medications Prior to Admission  Medication Sig Dispense Refill Last  Dose  . acetaminophen (TYLENOL) 650 MG CR tablet Take 650 mg by mouth every 6 (six) hours.    08/02/2019 at 0600  . ALPRAZolam (XANAX) 0.5 MG tablet Take 1 tablet (0.5 mg total) by mouth 3 (three) times daily. 5 tablet 0 08/02/2019 at 2000  . alum & mag hydroxide-simeth (MYLANTA) [id number] MG/5ML suspension Take 30 mLs by mouth every 12 (twelve) hours as needed.    prn at prn  . aspirin 81 MG tablet Take 81 mg by mouth daily.   08/02/2019 at 0900  . bisacodyl (DULCOLAX) 10 MG suppository Place 10 mg rectally daily.   08/02/2019 at 0800  . Dextromethorphan-Benzocaine (CEPACOL SORE THROAT & COUGH) 5-7.5 MG LOZG Use as directed 1 lozenge in the mouth or throat every 2 (two) hours as needed (sore throat).   prn at prn  . ergocalciferol (VITAMIN D2) 50000 units capsule Take 50,000 Units by mouth every 30 (thirty) days.   Past Month at Unknown time  . furosemide (LASIX) 40 MG tablet Take 60 mg by mouth daily.    08/02/2019 at 0900  . gabapentin (NEURONTIN) 400 MG capsule Take 2 capsules (800 mg total) by mouth 3 (three) times daily. 30 capsule 0 08/02/2019 at 2000  . lactulose (CHRONULAC) 10  GM/15ML solution Take 20 g by mouth daily.   08/02/2019 at 0900  . levETIRAcetam (KEPPRA) 500 MG tablet Take 500 mg by mouth 2 (two) times daily.    08/02/2019 at 1600  . lisinopril (PRINIVIL,ZESTRIL) 5 MG tablet Take 7.5 mg by mouth daily.   08/02/2019 at 0900  . metoprolol succinate (TOPROL-XL) 25 MG 24 hr tablet Take 25 mg by mouth daily.   08/02/2019 at 0900  . nicotine polacrilex (NICORETTE) 2 MG gum Take 2 mg by mouth every 4 (four) hours as needed for smoking cessation.   prn at prn  . nitroGLYCERIN (NITROSTAT) 0.4 MG SL tablet Place 0.4 mg under the tongue every 5 (five) minutes as needed for chest pain.   prn at prn  . nystatin (NYSTATIN) powder Apply topically 2 (two) times daily. Groin rash   08/02/2019 at Unknown time  . ondansetron (ZOFRAN) 4 MG tablet Take 4 mg by mouth every 8 (eight) hours as needed  for nausea or vomiting.   prn at prn  . pantoprazole (PROTONIX) 20 MG tablet Take 20 mg by mouth daily.   08/02/2019 at 0630  . polyethylene glycol (MIRALAX / GLYCOLAX) packet Take 17 g by mouth daily.    08/02/2019 at 0900  . sertraline (ZOLOFT) 25 MG tablet Take 25 mg by mouth daily.   08/02/2019 at 0900  . simvastatin (ZOCOR) 10 MG tablet Take 10 mg by mouth at bedtime.    08/02/2019 at 2000  . tiotropium (SPIRIVA) 18 MCG inhalation capsule Place 18 mcg into inhaler and inhale daily.   08/02/2019 at 0900  . trolamine salicylate (ASPERCREME) 10 % cream Apply 1 application topically 3 (three) times daily as needed. To neck    prn at prn  . Wheat Dextrin (BENEFIBER DRINK MIX PO) Take 5 g by mouth daily.   08/02/2019 at 0900   Scheduled: .  stroke: mapping our early stages of recovery book   Does not apply Once  . aspirin  300 mg Rectal Daily   Or  . aspirin  325 mg Oral Daily  . chlorhexidine  15 mL Mouth Rinse BID  . gabapentin  600 mg Oral TID  . heparin  5,000 Units Subcutaneous Q8H  . lactulose  20 g Oral Daily  . levETIRAcetam  500 mg Oral BID  . mouth rinse  15 mL Mouth Rinse q12n4p  . tiotropium  18 mcg Inhalation Daily    ROS: History obtained from the patient  General ROS: negative for - chills, fatigue, fever, night sweats, weight gain or weight loss Psychological ROS: negative for - behavioral disorder, hallucinations, memory difficulties, mood swings or suicidal ideation Ophthalmic ROS: blurry vision ENT ROS: negative for - epistaxis, nasal discharge, oral lesions, sore throat, tinnitus or vertigo Allergy and Immunology ROS: negative for - hives or itchy/watery eyes Hematological and Lymphatic ROS: negative for - bleeding problems, bruising or swollen lymph nodes Endocrine ROS: negative for - galactorrhea, hair pattern changes, polydipsia/polyuria or temperature intolerance Respiratory ROS: negative for - cough, hemoptysis, shortness of breath or wheezing Cardiovascular  ROS: negative for - chest pain, dyspnea on exertion, edema or irregular heartbeat Gastrointestinal ROS: negative for - abdominal pain, diarrhea, hematemesis, nausea/vomiting or stool incontinence Genito-Urinary ROS: negative for - dysuria, hematuria, incontinence or urinary frequency/urgency Musculoskeletal ROS: negative for - joint swelling or muscular weakness Neurological ROS: as noted in HPI Dermatological ROS: negative for rash and skin lesion changes  Physical Examination: Blood pressure (!) 135/91, pulse 74, temperature 98.9 F (  37.2 C), temperature source Oral, resp. rate 18, height 5\' 11"  (1.803 m), weight 96.4 kg, SpO2 97 %.  HEENT-  Normocephalic, no lesions, without obvious abnormality.  Normal external eye and conjunctiva.  Normal TM's bilaterally.  Normal auditory canals and external ears. Normal external nose, mucus membranes and septum.  Normal pharynx. Cardiovascular- S1, S2 normal, pulses palpable throughout   Lungs- chest clear, no wheezing, rales, normal symmetric air entry Abdomen- soft, non-tender; bowel sounds normal; no masses,  no organomegaly Extremities- mild BLE edema Lymph-no adenopathy palpable Musculoskeletal-no joint tenderness, deformity or swelling Skin-warm and dry, no hyperpigmentation, vitiligo, or suspicious lesions  Neurological Examination   Mental Status: Alert, oriented, thought content appropriate.  Speech fluent without evidence of aphasia.  Able to follow 3 step commands without difficulty. Cranial Nerves: II: LHH, pupils equal, round, reactive to light and accommodation III,IV, VI: ptosis not present, extra-ocular motions intact bilaterally V,VII: face symmetric, facial light touch sensation decreased on the left VIII: hearing normal bilaterally IX,X: gag reflex present XI: bilateral shoulder shrug XII: midline tongue extension Motor: Generalized weakness with no focal abnormalities noted and no evidence of drift Sensory: Pinprick and  light touch decreased on the left upper and lower extremities Deep Tendon Reflexes: Symmetric throughout Plantars: Right: mute   Left: mute Cerebellar: Normal finger-to-nose and normal heel-to-shin testing bilaterally Gait: not tested due to safety concerns    Laboratory Studies:  Basic Metabolic Panel: Recent Labs  Lab 08/03/19 1009 08/04/19 0520 08/05/19 0502 08/06/19 0436 08/07/19 0647 08/08/19 0830  NA 143 145 146* 145 142 141  K 2.8* 3.2* 3.7 4.1 4.1 4.0  CL 99 105 113* 113* 111 108  CO2 29 28 26 25 23 22   GLUCOSE 106* 131* 112* 104* 100* 83  BUN 9 9 7* 8 8 8   CREATININE 1.25* 1.03 1.07 1.07 1.02 1.11  CALCIUM 9.1 8.8* 8.4* 8.6* 8.4* 8.8*  MG 2.1 2.3  --  2.1 2.0 1.9  PHOS 3.5  --   --  2.3*  --   --     Liver Function Tests: Recent Labs  Lab 08/03/19 1009 08/07/19 0647 08/08/19 0830  AST 16 13* 16  ALT 14 9 13   ALKPHOS 62 48 51  BILITOT 0.8 0.8 0.7  PROT 7.4 6.8 7.0  ALBUMIN 4.0 3.3* 3.5   Recent Labs  Lab 08/03/19 1009  LIPASE 22   Recent Labs  Lab 08/07/19 0647  AMMONIA 27    CBC: Recent Labs  Lab 08/03/19 1009 08/04/19 0520 08/07/19 0647 08/08/19 0830  WBC 6.9 5.6 3.5* 4.1  NEUTROABS 5.4  --  2.3 2.4  HGB 13.7 12.7* 12.5* 12.6*  HCT 42.2 40.6 38.1* 38.0*  MCV 92.1 94.9 90.9 89.2  PLT 195 191 163 172    Cardiac Enzymes: No results for input(s): CKTOTAL, CKMB, CKMBINDEX, TROPONINI in the last 168 hours.  BNP: Invalid input(s): POCBNP  CBG: Recent Labs  Lab 08/06/19 1337 08/08/19 1010  GLUCAP 72 82    Microbiology: Results for orders placed or performed during the hospital encounter of 08/03/19  SARS CORONAVIRUS 2 (TAT 6-24 HRS) Nasopharyngeal Nasopharyngeal Swab     Status: None   Collection Time: 08/03/19  3:29 PM   Specimen: Nasopharyngeal Swab  Result Value Ref Range Status   SARS Coronavirus 2 NEGATIVE NEGATIVE Final    Comment: (NOTE) SARS-CoV-2 target nucleic acids are NOT DETECTED. The SARS-CoV-2 RNA is  generally detectable in upper and lower respiratory specimens during the acute phase of  infection. Negative results do not preclude SARS-CoV-2 infection, do not rule out co-infections with other pathogens, and should not be used as the sole basis for treatment or other patient management decisions. Negative results must be combined with clinical observations, patient history, and epidemiological information. The expected result is Negative. Fact Sheet for Patients: SugarRoll.be Fact Sheet for Healthcare Providers: https://www.woods-mathews.com/ This test is not yet approved or cleared by the [place] FDA and  has been authorized for detection and/or diagnosis of SARS-CoV-2 by FDA under an Emergency Use Authorization (EUA). This EUA will remain  in effect (meaning this test can be used) for the duration of the COVID-19 declaration under Section 56 4(b)(1) of the Act, 21 U.S.C. section 360bbb-3(b)(1), unless the authorization is terminated or revoked sooner. Performed at [facility], [place] [address] [place], [place] [address]   Urine culture     Status: Abnormal   Collection Time: 08/03/19  4:09 PM   Specimen: Urine, Clean Catch  Result Value Ref Range Status   Specimen Description   Final    URINE, CLEAN CATCH Performed at [facility], [address], [place], [place] [address]    Special Requests   Final    NONE Performed at [facility], [address] [place], [place] [address]    Culture (A)  Final    >=100,000 COLONIES/mL GROUP B STREP(S.AGALACTIAE)ISOLATED TESTING AGAINST S. AGALACTIAE NOT ROUTINELY PERFORMED DUE TO PREDICTABILITY OF AMP/PEN/VAN SUSCEPTIBILITY. Performed at [facility], [place] [address], [place], [place] [address]    Report Status 08/04/2019 FINAL  Final  MRSA PCR Screening     Status: None   Collection Time: 08/04/19  6:35 AM   Specimen: Nasal Mucosa; Nasopharyngeal   Result Value Ref Range Status   MRSA by PCR NEGATIVE NEGATIVE Final    Comment:        The GeneXpert MRSA Assay (FDA approved for NASAL specimens only), is one component of a comprehensive MRSA colonization surveillance program. It is not intended to diagnose MRSA infection nor to guide or monitor treatment for MRSA infections. Performed at [facility], [address] [place], [place] [address]     Coagulation Studies: No results for input(s): LABPROT, INR in the last 72 hours.  Urinalysis:  Recent Labs  Lab 08/03/19 1009  COLORURINE YELLOW*  LABSPEC 1.016  PHURINE 6.0  GLUCOSEU NEGATIVE  HGBUR SMALL*  BILIRUBINUR NEGATIVE  KETONESUR NEGATIVE  PROTEINUR 30*  NITRITE NEGATIVE  LEUKOCYTESUR LARGE*    Lipid Panel:    Component Value Date/Time   CHOL 144 10/01/2017 0358   TRIG 82 10/01/2017 0358   HDL 58 10/01/2017 0358   CHOLHDL 2.5 10/01/2017 0358   VLDL 16 10/01/2017 0358   LDLCALC 70 10/01/2017 0358    HgbA1C:  Lab Results  Component Value Date   HGBA1C 6.2 (H) 10/01/2017    Urine Drug Screen:      Component Value Date/Time   LABOPIA NEGATIVE 08/14/2014 0933   COCAINSCRNUR NEGATIVE 08/14/2014 0933   LABBENZ NEGATIVE 08/14/2014 0933   AMPHETMU NEGATIVE 08/14/2014 0933   THCU NEGATIVE 08/14/2014 0933   LABBARB NEGATIVE 08/14/2014 0933    Alcohol Level: No results for input(s): ETH in the last 168 hours.  Other results: EKG: sinus rhythm at 71 bpm with 1st degree AV block.  Imaging: Dg Abd 2 Views  Result Date: 08/06/2019 CLINICAL DATA:  Abdominal distension. EXAM: ABDOMEN - 2 VIEW COMPARISON:  Plain films of the abdomen and  CT abdomen and pelvis 08/03/2019. Plain films of the abdomen 04/27/2019. FINDINGS: Gaseous distention of the colon is unchanged. No free intraperitoneal air. No unexpected abdominal calcification. Atherosclerosis is noted. The patient is status post left hip replacement. IMPRESSION: Gaseous distention of the colon  most compatible with ileus. Electronically Signed   By: Inge Rise M.D.   On: 08/06/2019 15:30   Dg Abd Portable 1v  Result Date: 08/08/2019 CLINICAL DATA:  Abdominal distension. EXAM: PORTABLE ABDOMEN - 1 VIEW COMPARISON:  08/06/2019 FINDINGS: Persistent mildly dilated air-filled colon along with scattered nondilated air-filled small bowel loops. Findings suggest a colonic ileus. No free air is identified. The lung bases are grossly clear. The bony structures are unremarkable. Left hip prosthesis noted with adjacent heterotopic ossification. Stable vascular calcifications. IMPRESSION: Colonic ileus bowel gas pattern without findings for obstruction or perforation. Electronically Signed   By: Marijo Sanes M.D.   On: 08/08/2019 08:55    Assessment: 68 y.o. male admitted 11/29 with abdominal complaints who today was noted to have left sided complaints.  On further conversation it appears that these complaints have been longstanding.  Review of old imaging shows area of damage that would correspond to current findings on neurological examination.  With no clear LKW, patient not a tPA candidate.  No evidence of large vessel occlusion.  Patient on ASA daily.    Stroke Risk Factors - hypertension  Plan: 1. HgbA1c, fasting lipid panel 2. MRI of the brain without contrast.  Would not entertain further stroke work up with carotid dopplers and echocardiogram unless evidence of acute infarct on imaging.   3. PT consult, OT consult, Speech consult 4. Prophylactic therapy-continue ASA daily 5. NPO until RN stroke swallow screen 6. Telemetry monitoring 7. Frequent neuro checks   Alexis Goodell, MD Neurology [phone] 08/08/2019, 1:17 PM

## 2019-08-08 NOTE — Progress Notes (Signed)
SLP Cancellation Note  Patient Details Name: Calvin [name] MRN: [id number] DOB: 1951/06/04   Cancelled treatment:       Reason Eval/Treat Not Completed: SLP screened, no needs identified, will sign off  The patient denies changes in communication/speech.  He is able to participate in conversation with no observed difficulties.  Please re-consult SLP if any problems with communication or swallowing arise.  Calvin Sea, MS/CCC- SLP  Lou Miner 08/08/2019, 3:05 PM

## 2019-08-08 NOTE — Progress Notes (Signed)
RN left a voicemail with [name] voicemail on her cell phone. No voicemail available on her home phone in an attempt to obtain consent. RN also tried to contact [employer], no voicemail on one number and the other number is out of service. MD aware.

## 2019-08-08 NOTE — Progress Notes (Addendum)
Ch responded to a pg from the pt nurse who is presenting to have stroke-like symptoms. As reported by the care team, the pt was responding to questions appropriately but c/o weakness on L side and 'feeling sick'. Ch checked in with staff which shared that the pt is from a SNF that has COVID 19+ pt (pt is currently CV19 negative) which required the RRT to dawn appropriate PPE before entering. Ch allowed time for pt to be assessed by RRT and will f/u later via telephone.    08/08/19 1000  Clinical Encounter Type  Visited With Patient;Health care provider  Visit Type Code  Referral From Nurse  Consult/Referral To Chaplain  Stress Factors  Patient Stress Factors Exhausted;Health changes  Family Stress Factors None identified

## 2019-08-08 NOTE — Plan of Care (Signed)
Patient complained about "feeling sick" while drinking bowel prep. Patient got Zofran at 0300 after complaining of nausea and vomited 200 ml of bile colored emesis. NP notified and he ordered Phenergan and to stop bowel prep. Patient given Phenergan and now resting quietly in bed. Will continue to monitor.

## 2019-08-08 NOTE — TOC Initial Note (Signed)
Transition of Care [facility]) - Initial/Assessment Note    Patient Details  Name: Calvin [name] MRN: [id number] Date of Birth: [birth year]  [facility]) CM/SW Contact:    Beverly Sessions, RN Phone Number: 08/08/2019, 3:49 PM  Clinical Narrative:                 Patient admitted from [place] with ileus  Patient only alert to self  RNCM attempted to contact daughter x2 left voicemail  RNCM spoke with Liliane Channel at [facility] and confirmed that patient can return at discharge.  Per Liliane Channel patient fall under "optum" and will not require insurance reauth to return  FL2 completed and sent for signature     Expected Discharge Plan: Long Term Nursing Home Barriers to Discharge: Continued Medical Work up   Patient Goals and CMS Choice        Expected Discharge Plan and Services Expected Discharge Plan: [place] Acute Care Choice: [place] Living arrangements for the past 2 months: [place]                                      Prior Living Arrangements/Services Living arrangements for the past 2 months: [place] Lives with:: Facility Resident Patient language and need for interpreter reviewed:: Yes              Criminal Activity/Legal Involvement Pertinent to Current Situation/Hospitalization: No - Comment as needed  Activities of Daily Living Home Assistive Devices/Equipment: Wheelchair, Environmental consultant (specify type) ADL Screening (condition at time of admission) Patient's cognitive ability adequate to safely complete daily activities?: Yes Is the patient deaf or have difficulty hearing?: No Does the patient have difficulty seeing, even when wearing glasses/contacts?: No Does the patient have difficulty concentrating, remembering, or making decisions?: No Patient able to express need for assistance with ADLs?: Yes Does the patient have difficulty dressing or bathing?: No Independently performs  ADLs?: Yes (appropriate for developmental age) Does the patient have difficulty walking or climbing stairs?: Yes Weakness of Legs: Both Weakness of Arms/Hands: None  Permission Sought/Granted                  Emotional Assessment       Orientation: : Oriented to Self      Admission diagnosis:  Hypokalemia [E87.6] Ileus ([place]) [K56.7] Acute cystitis without hematuria [N30.00] Patient Active Problem List   Diagnosis Date Noted  . Acute UTI 08/03/2019  . Ileus ([place]) 08/03/2019  . Hypokalemia 08/03/2019  . QT prolongation 08/03/2019  . Ogilvie's syndrome   . Abdominal pain 04/03/2019  . Left-sided weakness 09/30/2017  . Seizures ([place]) 09/30/2017  . HTN (hypertension) 09/30/2017  . CAD (coronary artery disease) 09/30/2017  . COPD (chronic obstructive pulmonary disease) ([place]) 09/30/2017  . Depression with anxiety 09/30/2017   PCP:  Lynnell Jude, MD Pharmacy:  No Pharmacies Listed    Social Determinants of Health (SDOH) Interventions    Readmission Risk Interventions Readmission Risk Prevention Plan 08/08/2019  Transportation Screening Complete  HRI or [place] Not Complete  HRI or Home Care Consult comments not indicated from LTC  Medication Review (RN Care Manager) Complete  Some recent data might be hidden

## 2019-08-08 NOTE — Progress Notes (Signed)
Code stroke called after Dr. Posey Pronto assessed pt and reported a visual deficit on the left to the RN and that a code stroke needed to be called. Dr. Doy Mince assessed pt, ordered TELE, MRI and NIH scale. Pt to be transferred downstairs.

## 2019-08-08 NOTE — Progress Notes (Signed)
RN called rapid response after pt reported that the left side of his face was numb. Pt stated, "I feel like I'm having a stroke." Pts neuro assessment was WDL. ICU nurse also assessed pt and found no abnormalities. Pt then reported that he feels like this because he hasn't had his medication. RN administered meds, will continue to monitor.

## 2019-08-08 NOTE — Progress Notes (Signed)
PT Cancellation Note  Patient Details Name: Calvin [name] MRN: [id number] DOB: 07/10/1951   Cancelled Treatment:    Reason Eval/Treat Not Completed: Patient declined, no reason specified.  Pt initially agreeable to therapy and then refused mobility after introductory conversation.  Pt did agree to scoot up in bed which he was able to do independently with bed placed in trendelenburg.  When asked if he would be more willing to work with therapy tomorrow, pt did not answer directly.  Pt oriented to self, situation and location.  Roxanne Gates, PT, DPT  Roxanne Gates 08/08/2019, 3:51 PM

## 2019-08-08 NOTE — Progress Notes (Signed)
Vonda Antigua, MD [address], [place], [place], [place], [address] [address], [place], [place], [place], [address] Phone: [phone]  Fax: [phone]   Subjective: Patient drank one quarter of his prep yesterday.  Output was not clear but was watery brown.  Patient is also undergoing stroke evaluation at this time with MRI brain pending   Objective: Exam: Vital signs in last 24 hours: Vitals:   08/08/19 0349 08/08/19 1017 08/08/19 1147 08/08/19 1234  BP: (!) 147/98 138/89 (!) 152/89 (!) 135/91  Pulse: 89 78 77 74  Resp: 20  16 18   Temp: 98.2 F (36.8 C) 98.5 F (36.9 C) 98.7 F (37.1 C) 98.9 F (37.2 C)  TempSrc: Oral Oral Oral Oral  SpO2: 97% 99% 96% 97%  Weight:      Height:       Weight change:   Intake/Output Summary (Last 24 hours) at 08/08/2019 1504 Last data filed at 08/08/2019 [id number] Gross per 24 hour  Intake 711.36 ml  Output 470 ml  Net 241.36 ml    General: No acute distress Abd: Soft, NT/ND, No HSM Skin: Warm, no rashes Neck: Supple, Trachea midline   Lab Results: Lab Results  Component Value Date   WBC 4.1 08/08/2019   HGB 12.6 (L) 08/08/2019   HCT 38.0 (L) 08/08/2019   MCV 89.2 08/08/2019   PLT 172 08/08/2019   Micro Results: Recent Results (from the past 240 hour(s))  SARS CORONAVIRUS 2 (TAT 6-24 HRS) Nasopharyngeal Nasopharyngeal Swab     Status: None   Collection Time: 08/03/19  3:29 PM   Specimen: Nasopharyngeal Swab  Result Value Ref Range Status   SARS Coronavirus 2 NEGATIVE NEGATIVE Final    Comment: (NOTE) SARS-CoV-2 target nucleic acids are NOT DETECTED. The SARS-CoV-2 RNA is generally detectable in upper and lower respiratory specimens during the acute phase of infection. Negative results do not preclude SARS-CoV-2 infection, do not rule out co-infections with other pathogens, and should not be used as the sole basis for treatment or other patient management decisions. Negative results must be combined with  clinical observations, patient history, and epidemiological information. The expected result is Negative. Fact Sheet for Patients: SugarRoll.be Fact Sheet for Healthcare Providers: https://www.woods-mathews.com/ This test is not yet approved or cleared by the [place] FDA and  has been authorized for detection and/or diagnosis of SARS-CoV-2 by FDA under an Emergency Use Authorization (EUA). This EUA will remain  in effect (meaning this test can be used) for the duration of the COVID-19 declaration under Section 56 4(b)(1) of the Act, 21 U.S.C. section 360bbb-3(b)(1), unless the authorization is terminated or revoked sooner. Performed at [facility], [place] [address], [place], [place] [address]   Urine culture     Status: Abnormal   Collection Time: 08/03/19  4:09 PM   Specimen: Urine, Clean Catch  Result Value Ref Range Status   Specimen Description   Final    URINE, CLEAN CATCH Performed at [facility], [address], [place], [place] [address]    Special Requests   Final    NONE Performed at [facility], [address] [place], [place] [address]    Culture (A)  Final    >=100,000 COLONIES/mL GROUP B STREP(S.AGALACTIAE)ISOLATED TESTING AGAINST S. AGALACTIAE NOT ROUTINELY PERFORMED DUE TO PREDICTABILITY OF AMP/PEN/VAN SUSCEPTIBILITY. Performed at [facility], [place] [address], [place],  [address]    Report Status 08/04/2019 FINAL  Final  MRSA PCR Screening  Status: None   Collection Time: 08/04/19  6:35 AM   Specimen: Nasal Mucosa; Nasopharyngeal  Result Value Ref Range Status   MRSA by PCR NEGATIVE NEGATIVE Final    Comment:        The GeneXpert MRSA Assay (FDA approved for NASAL specimens only), is one component of a comprehensive MRSA colonization surveillance program. It is not intended to diagnose MRSA infection nor to guide or monitor treatment for MRSA  infections. Performed at [facility], [address] [place], [place] [address]   SARS Coronavirus 2 by RT PCR (hospital order, performed in [facility] hospital lab) Nasopharyngeal Nasopharyngeal Swab     Status: None   Collection Time: 08/08/19  1:21 PM   Specimen: Nasopharyngeal Swab  Result Value Ref Range Status   SARS Coronavirus 2 NEGATIVE NEGATIVE Final    Comment: (NOTE) SARS-CoV-2 target nucleic acids are NOT DETECTED. The SARS-CoV-2 RNA is generally detectable in upper and lower respiratory specimens during the acute phase of infection. The lowest concentration of SARS-CoV-2 viral copies this assay can detect is 250 copies / mL. A negative result does not preclude SARS-CoV-2 infection and should not be used as the sole basis for treatment or other patient management decisions.  A negative result may occur with improper specimen collection / handling, submission of specimen other than nasopharyngeal swab, presence of viral mutation(s) within the areas targeted by this assay, and inadequate number of viral copies (<250 copies / mL). A negative result must be combined with clinical observations, patient history, and epidemiological information. Fact Sheet for Patients:   StrictlyIdeas.no Fact Sheet for Healthcare Providers: BankingDealers.co.za This test is not yet approved or cleared  by the [place] FDA and has been authorized for detection and/or diagnosis of SARS-CoV-2 by FDA under an Emergency Use Authorization (EUA).  This EUA will remain in effect (meaning this test can be used) for the duration of the COVID-19 declaration under Section 564(b)(1) of the Act, 21 U.S.C. section 360bbb-3(b)(1), unless the authorization is terminated or revoked sooner. Performed at [facility], [address], [place], [place] [address]    Studies/Results: Dg Abd 2 Views  Result Date: 08/06/2019 CLINICAL DATA:   Abdominal distension. EXAM: ABDOMEN - 2 VIEW COMPARISON:  Plain films of the abdomen and CT abdomen and pelvis 08/03/2019. Plain films of the abdomen 04/27/2019. FINDINGS: Gaseous distention of the colon is unchanged. No free intraperitoneal air. No unexpected abdominal calcification. Atherosclerosis is noted. The patient is status post left hip replacement. IMPRESSION: Gaseous distention of the colon most compatible with ileus. Electronically Signed   By: Inge Rise M.D.   On: 08/06/2019 15:30   Dg Abd Portable 1v  Result Date: 08/08/2019 CLINICAL DATA:  Abdominal distension. EXAM: PORTABLE ABDOMEN - 1 VIEW COMPARISON:  08/06/2019 FINDINGS: Persistent mildly dilated air-filled colon along with scattered nondilated air-filled small bowel loops. Findings suggest a colonic ileus. No free air is identified. The lung bases are grossly clear. The bony structures are unremarkable. Left hip prosthesis noted with adjacent heterotopic ossification. Stable vascular calcifications. IMPRESSION: Colonic ileus bowel gas pattern without findings for obstruction or perforation. Electronically Signed   By: Marijo Sanes M.D.   On: 08/08/2019 08:55   Medications:  Scheduled Meds: .  stroke: mapping our early stages of recovery book   Does not apply Once  . aspirin  300 mg Rectal Daily   Or  . aspirin  325 mg Oral Daily  . chlorhexidine  15 mL Mouth Rinse  BID  . gabapentin  600 mg Oral TID  . heparin  5,000 Units Subcutaneous Q8H  . lactulose  20 g Oral Daily  . levETIRAcetam  500 mg Oral BID  . mouth rinse  15 mL Mouth Rinse q12n4p  . tiotropium  18 mcg Inhalation Daily   Continuous Infusions: . sodium chloride 100 mL/hr at 08/08/19 1307   PRN Meds:.acetaminophen, ALPRAZolam, ondansetron (ZOFRAN) IV, phenol   Assessment: Principal Problem:   Ileus (HCC) Active Problems:   Acute UTI   Hypokalemia   QT prolongation    Plan: Patient's ileus likely related to polypharmacy and recumbent state  since he lives in a nursing home and does not get out of bed much  A colonoscopy was planned but due to now a new stroke work-up, it is best to not proceed with endoscopic procedures at this time.    As an aside, it has been impossible to reach family over the last 2 days.  Multiple people have tried and have been unable to get a call back from any of the family members listed to get consent.  Speaking to the nursing home this has been a frequent problem in reaching his family.  Endoscopy can be considered at a later time when he is further out from his acute episodes  Would encourage ambulation once stroke has been ruled out and patient tolerates.  Pt had one BM today. Start miralax daily and as needed bowel regimen to ensure regular BMs  Encourage frequent position changes in bed  PCP or primary team to evaluate his medications and try to minimize medications that are not needed to prevent polypharmacy  Follow up in GI clinic in 2-4 weeks  GI service will sign off at this time, please page with any questions or concerns     LOS: 5 days   Vonda Antigua, MD 08/08/2019, 3:04 PM

## 2019-08-09 ENCOUNTER — Inpatient Hospital Stay (HOSPITAL_COMMUNITY)
Admit: 2019-08-09 | Discharge: 2019-08-09 | Disposition: A | Discharge status: Home / Self Care | Payer: Medicare Other | Attending: Internal Medicine | Admitting: Internal Medicine

## 2019-08-09 DIAGNOSIS — K567 Ileus, unspecified: Secondary | ICD-10-CM | POA: Diagnosis not present

## 2019-08-09 DIAGNOSIS — G459 Transient cerebral ischemic attack, unspecified: Secondary | ICD-10-CM

## 2019-08-09 DIAGNOSIS — R531 Weakness: Secondary | ICD-10-CM | POA: Diagnosis not present

## 2019-08-09 DIAGNOSIS — E876 Hypokalemia: Secondary | ICD-10-CM | POA: Diagnosis not present

## 2019-08-09 DIAGNOSIS — N390 Urinary tract infection, site not specified: Secondary | ICD-10-CM | POA: Diagnosis not present

## 2019-08-09 LAB — LIPID PANEL
Cholesterol: 110 mg/dL (ref 0–200)
HDL: 36 mg/dL — ABNORMAL LOW (ref >40)
LDL Cholesterol: 51 mg/dL (ref 0–99)
Total CHOL/HDL Ratio: 3.1 RATIO
Triglycerides: 115 mg/dL (ref <150)
VLDL: 23 mg/dL (ref 0–40)

## 2019-08-09 LAB — ECHOCARDIOGRAM COMPLETE
Height: 71 in
Weight: 3400.38 oz

## 2019-08-09 LAB — HEMOGLOBIN A1C
Hgb A1c MFr Bld: 5.7 % — ABNORMAL HIGH (ref 4.8–5.6)
Mean Plasma Glucose: 116.89 mg/dL

## 2019-08-09 MED ORDER — LISINOPRIL 5 MG PO TABS
7.5000 mg | ORAL_TABLET | Freq: Every day | ORAL | Status: DC
  Administered 2019-08-09 – 2019-08-14 (×6): 7.5 mg via ORAL
  Filled 2019-08-09 (×6): qty 2

## 2019-08-09 MED ORDER — PERFLUTREN LIPID MICROSPHERE
1.0000 mL | INTRAVENOUS | Status: AC | PRN
  Filled 2019-08-09: qty 10

## 2019-08-09 MED ORDER — METOCLOPRAMIDE HCL 5 MG/ML IJ SOLN
5.0000 mg | Freq: Three times a day (TID) | INTRAMUSCULAR | Status: DC
  Administered 2019-08-09 – 2019-08-11 (×7): 5 mg via INTRAVENOUS
  Filled 2019-08-09 (×7): qty 2

## 2019-08-09 MED ORDER — PSYLLIUM 95 % PO PACK
1.0000 | PACK | Freq: Every day | ORAL | Status: DC
  Administered 2019-08-09: 12:00:00 1 via ORAL
  Filled 2019-08-09 (×4): qty 1

## 2019-08-09 MED ORDER — METOPROLOL SUCCINATE ER 25 MG PO TB24
25.0000 mg | ORAL_TABLET | Freq: Every day | ORAL | Status: DC
  Administered 2019-08-09 – 2019-08-14 (×6): 25 mg via ORAL
  Filled 2019-08-09 (×6): qty 1

## 2019-08-09 MED ORDER — SIMETHICONE 80 MG PO CHEW
80.0000 mg | CHEWABLE_TABLET | Freq: Four times a day (QID) | ORAL | Status: DC
  Administered 2019-08-09 – 2019-08-14 (×19): 80 mg via ORAL
  Filled 2019-08-09 (×24): qty 1

## 2019-08-09 NOTE — Progress Notes (Signed)
OT Cancellation Note  Patient Details Name: GEDALIA SHIELS MRN: [id number] DOB: 1950/12/20   Cancelled Treatment:    Reason Eval/Treat Not Completed: Other (comment) OT order received and chart reviewed. CNA and Financial controller currently in room providing pt care. Will f/u as able for OT evaluation  Gerrianne Scale, [place], OTR/L ascom [phone] 08/09/19, 12:57 PM

## 2019-08-09 NOTE — Progress Notes (Signed)
Subjective: Patient awake and alert.  No new neurological complaints.    Objective: Current vital signs: BP (!) 168/89 (BP Location: Left Arm)   Pulse 79   Temp 97.8 F (36.6 C) (Oral)   Resp 15   Ht 5\' 11"  (1.803 m)   Wt 96.4 kg   SpO2 99%   BMI 29.64 kg/m  Vital signs in last 24 hours: Temp:  [97.8 F (36.6 C)-98.9 F (37.2 C)] 97.8 F (36.6 C) (12/05 [id number]) Pulse Rate:  [70-79] 79 (12/05 0812) Resp:  [15-20] 15 (12/05 0812) BP: (135-168)/(84-99) 168/89 (12/05 0812) SpO2:  [95 %-99 %] 99 % (12/05 0812)  Intake/Output from previous day: 12/04 0701 - 12/05 0700 In: 2689.3 [I.V.:2689.3] Out: 820 [Urine:820] Intake/Output this shift: No intake/output data recorded. Nutritional status:  Diet Order            Diet clear liquid Room service appropriate? Yes; Fluid consistency: Thin  Diet effective now              Neurologic Exam: Mental Status: Alert, oriented, thought content appropriate.  Speech fluent without evidence of aphasia.  Able to follow 3 step commands without difficulty. Cranial Nerves: II: LHH, pupils equal, round, reactive to light and accommodation III,IV, VI: ptosis not present, extra-ocular motions intact bilaterally V,VII: face symmetric, facial light touch sensation decreased on the left VIII: hearing normal bilaterally IX,X: gag reflex present XI: bilateral shoulder shrug XII: midline tongue extension Motor: Generalized weakness with no focal abnormalities noted and no evidence of drift Sensory: Pinprick and light touch decreased on the left upper and lower extremities   Lab Results: Basic Metabolic Panel: Recent Labs  Lab 08/03/19 1009 08/04/19 0520 08/05/19 0502 08/06/19 0436 08/07/19 0647 08/08/19 0830  NA 143 145 146* 145 142 141  K 2.8* 3.2* 3.7 4.1 4.1 4.0  CL 99 105 113* 113* 111 108  CO2 29 28 26 25 23 22   GLUCOSE 106* 131* 112* 104* 100* 83  BUN 9 9 7* 8 8 8   CREATININE 1.25* 1.03 1.07 1.07 1.02 1.11  CALCIUM 9.1 8.8*  8.4* 8.6* 8.4* 8.8*  MG 2.1 2.3  --  2.1 2.0 1.9  PHOS 3.5  --   --  2.3*  --   --     Liver Function Tests: Recent Labs  Lab 08/03/19 1009 08/07/19 0647 08/08/19 0830  AST 16 13* 16  ALT 14 9 13   ALKPHOS 62 48 51  BILITOT 0.8 0.8 0.7  PROT 7.4 6.8 7.0  ALBUMIN 4.0 3.3* 3.5   Recent Labs  Lab 08/03/19 1009  LIPASE 22   Recent Labs  Lab 08/07/19 0647  AMMONIA 27    CBC: Recent Labs  Lab 08/03/19 1009 08/04/19 0520 08/07/19 0647 08/08/19 0830  WBC 6.9 5.6 3.5* 4.1  NEUTROABS 5.4  --  2.3 2.4  HGB 13.7 12.7* 12.5* 12.6*  HCT 42.2 40.6 38.1* 38.0*  MCV 92.1 94.9 90.9 89.2  PLT 195 191 163 172    Cardiac Enzymes: No results for input(s): CKTOTAL, CKMB, CKMBINDEX, TROPONINI in the last 168 hours.  Lipid Panel: No results for input(s): CHOL, TRIG, HDL, CHOLHDL, VLDL, LDLCALC in the last 168 hours.  CBG: Recent Labs  Lab 08/06/19 1337 08/08/19 1010  GLUCAP 72 82    Microbiology: Results for orders placed or performed during the hospital encounter of 08/03/19  SARS CORONAVIRUS 2 (TAT 6-24 HRS) Nasopharyngeal Nasopharyngeal Swab     Status: None   Collection Time: 08/03/19  3:29  PM   Specimen: Nasopharyngeal Swab  Result Value Ref Range Status   SARS Coronavirus 2 NEGATIVE NEGATIVE Final    Comment: (NOTE) SARS-CoV-2 target nucleic acids are NOT DETECTED. The SARS-CoV-2 RNA is generally detectable in upper and lower respiratory specimens during the acute phase of infection. Negative results do not preclude SARS-CoV-2 infection, do not rule out co-infections with other pathogens, and should not be used as the sole basis for treatment or other patient management decisions. Negative results must be combined with clinical observations, patient history, and epidemiological information. The expected result is Negative. Fact Sheet for Patients: SugarRoll.be Fact Sheet for Healthcare  Providers: https://www.woods-mathews.com/ This test is not yet approved or cleared by the [place] FDA and  has been authorized for detection and/or diagnosis of SARS-CoV-2 by FDA under an Emergency Use Authorization (EUA). This EUA will remain  in effect (meaning this test can be used) for the duration of the COVID-19 declaration under Section 56 4(b)(1) of the Act, 21 U.S.C. section 360bbb-3(b)(1), unless the authorization is terminated or revoked sooner. Performed at [facility], [place] [address] [place], [place] [address]   Urine culture     Status: Abnormal   Collection Time: 08/03/19  4:09 PM   Specimen: Urine, Clean Catch  Result Value Ref Range Status   Specimen Description   Final    URINE, CLEAN CATCH Performed at [facility], [address], [place], [place] [address]    Special Requests   Final    NONE Performed at [facility], [address] [place], [place] [address]    Culture (A)  Final    >=100,000 COLONIES/mL GROUP B STREP(S.AGALACTIAE)ISOLATED TESTING AGAINST S. AGALACTIAE NOT ROUTINELY PERFORMED DUE TO PREDICTABILITY OF AMP/PEN/VAN SUSCEPTIBILITY. Performed at [facility], [place] [address], [place], [place] [address]    Report Status 08/04/2019 FINAL  Final  MRSA PCR Screening     Status: None   Collection Time: 08/04/19  6:35 AM   Specimen: Nasal Mucosa; Nasopharyngeal  Result Value Ref Range Status   MRSA by PCR NEGATIVE NEGATIVE Final    Comment:        The GeneXpert MRSA Assay (FDA approved for NASAL specimens only), is one component of a comprehensive MRSA colonization surveillance program. It is not intended to diagnose MRSA infection nor to guide or monitor treatment for MRSA infections. Performed at [facility], [address] [place], [place] [address]   SARS Coronavirus 2 by RT PCR (hospital order, performed in  [facility]) Nasopharyngeal Nasopharyngeal Swab      Status: None   Collection Time: 08/08/19  1:21 PM   Specimen: Nasopharyngeal Swab  Result Value Ref Range Status   SARS Coronavirus 2 NEGATIVE NEGATIVE Final    Comment: (NOTE) SARS-CoV-2 target nucleic acids are NOT DETECTED. The SARS-CoV-2 RNA is generally detectable in upper and lower respiratory specimens during the acute phase of infection. The lowest concentration of SARS-CoV-2 viral copies this assay can detect is 250 copies / mL. A negative result does not preclude SARS-CoV-2 infection and should not be used as the sole basis for treatment or other patient management decisions.  A negative result may occur with improper specimen collection / handling, submission of specimen other than nasopharyngeal swab, presence of viral mutation(s) within the areas targeted by this assay, and inadequate number of viral copies (<250 copies / mL). A negative result must be combined with clinical observations, patient history, and epidemiological information. Fact  Sheet for Patients:   StrictlyIdeas.no Fact Sheet for Healthcare Providers: BankingDealers.co.za This test is not yet approved or cleared  by the [place] FDA and has been authorized for detection and/or diagnosis of SARS-CoV-2 by FDA under an Emergency Use Authorization (EUA).  This EUA will remain in effect (meaning this test can be used) for the duration of the COVID-19 declaration under Section 564(b)(1) of the Act, 21 U.S.C. section 360bbb-3(b)(1), unless the authorization is terminated or revoked sooner. Performed at [facility], [address] [place], [place] [address]     Coagulation Studies: No results for input(s): LABPROT, INR in the last 72 hours.  Imaging: Mr Brain Wo Contrast  Result Date: 08/08/2019 CLINICAL DATA:  Focal neuro deficit, greater than 6 hours, stroke suspected. Additional history provided: RN called rapid response as patient reported  left side of face was numb. EXAM: MRI HEAD WITHOUT CONTRAST TECHNIQUE: Multiplanar, multiecho pulse sequences of the brain and surrounding structures were obtained without intravenous contrast. COMPARISON:  Brain MRI 10/01/2017 FINDINGS: Brain: There is no evidence of acute infarct. No evidence of intracranial mass. No midline shift or extra-axial fluid collection. No chronic intracranial blood products. 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 There is a background of moderate chronic small vessel ischemic disease. Moderate generalized parenchymal atrophy. 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 Flow voids otherwise maintained within the proximal large arterial vessels. Skull and upper cervical spine: No focal marrow lesion. Incompletely assessed upper cervical spondylosis with multilevel spinal canal stenosis. Sinuses/Orbits: Visualized orbits demonstrate no acute abnormality. Minimal scattered paranasal sinus mucosal thickening. Right sphenoid sinus mucous retention cyst, unchanged. No significant mastoid effusion. IMPRESSION: 1. No evidence of acute intracranial abnormality, including acute infarct. 2. 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 3. 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 4. Background of moderate generalized parenchymal atrophy and chronic small vessel ischemic disease. 5. 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 6. Incompletely assessed upper cervical spondylosis with spinal canal stenosis.  Electronically Signed   By: Kellie Simmering DO   On: 08/08/2019 21:32   US Carotid Bilateral (at [facility] Only)  Result Date: 08/09/2019 CLINICAL DATA:  TIA. Hypertension, coronary artery disease, previous tobacco abuse EXAM: BILATERAL CAROTID DUPLEX ULTRASOUND TECHNIQUE: Pearline Cables scale imaging, color Doppler and duplex ultrasound were performed of bilateral carotid and vertebral arteries in the neck. COMPARISON:  CTA neck 10/02/2017 FINDINGS: Criteria: Quantification of carotid stenosis is based on velocity parameters that correlate the residual internal carotid diameter with NASCET-based stenosis levels, using the diameter of the distal internal carotid lumen as the denominator for stenosis measurement. The following velocity measurements were obtained: RIGHT ICA: 148/34 cm/sec CCA: 123456 cm/sec SYSTOLIC ICA/CCA RATIO:  1.9 ECA: 58 cm/sec LEFT ICA: 175/38 cm/sec CCA: 0000000 cm/sec SYSTOLIC ICA/CCA RATIO:  3.4 ECA: 118 cm/sec RIGHT CAROTID ARTERY: Mild tortuosity. Eccentric noncalcified plaque in the distal common carotid artery and bulb without high-grade stenosis. Noncalcified plaque in the distal ICA with elevated peak systolic velocities. RIGHT VERTEBRAL ARTERY:  Normal flow direction and waveform. LEFT CAROTID ARTERY: Mild tortuosity. Partially calcified plaque in the bulb and proximal ICA. Noncalcified plaque extends through  the mid and distal ICA resulting in at least mild stenosis, with elevated peak systolic velocities, probably overestimated due to poor angle correction. LEFT VERTEBRAL ARTERY:  Normal flow direction and waveform. IMPRESSION: 1. 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 2.  Antegrade bilateral vertebral arterial flow. Electronically Signed   By: Lucrezia Europe M.D.   On: 08/09/2019 09:15   Dg Abd Portable 1v  Result Date: 08/08/2019 CLINICAL DATA:  Abdominal distension. EXAM: PORTABLE ABDOMEN - 1 VIEW COMPARISON:  08/06/2019 FINDINGS: Persistent mildly  dilated air-filled colon along with scattered nondilated air-filled small bowel loops. Findings suggest a colonic ileus. No free air is identified. The lung bases are grossly clear. The bony structures are unremarkable. Left hip prosthesis noted with adjacent heterotopic ossification. Stable vascular calcifications. IMPRESSION: Colonic ileus bowel gas pattern without findings for obstruction or perforation. Electronically Signed   By: Marijo Sanes M.D.   On: 08/08/2019 08:55    Medications:  I have reviewed the patient's current medications. Scheduled: . aspirin  300 mg Rectal Daily   Or  . aspirin  325 mg Oral Daily  . chlorhexidine  15 mL Mouth Rinse BID  . gabapentin  600 mg Oral TID  . heparin  5,000 Units Subcutaneous Q8H  . lactulose  20 g Oral Daily  . levETIRAcetam  500 mg Oral BID  . mouth rinse  15 mL Mouth Rinse q12n4p  . tiotropium  18 mcg Inhalation Daily    Assessment/Plan: No new neurological events.  MRI of the brain performed and shows no acute changes.  A1c and lipid panel pending  Recommendations: 1.  Target A1c<7.0 and target LDL<70 2. No further neurologic intervention is recommended at this time.  If further questions arise, please call or page at that time.  Thank you for allowing neurology to participate in the care of this patient.    LOS: 6 days   Alexis Goodell, MD Neurology [phone] 08/09/2019  9:40 AM

## 2019-08-09 NOTE — Progress Notes (Signed)
[facility] Progress Note  Patient: Calvin [name] [id number]   PCP: Lynnell Jude, MD DOB: November 22, 1950   DOA: 08/03/2019   DOS: 08/09/2019   Date of Service: the patient was seen and examined on 08/09/2019  Chief Complaint  Patient presents with  . Abdominal Pain   Brief hospital course: Pt. with PMH of bloody syndrome, alcohol abuse, anxiety, seizure disorder, CVA with residual left-sided weakness, COPD, depression, HTN, CAD; presented with complain of nausea and vomiting as well as abdominal pain and distention, was found to have recurrent ileus. GI was consulted as well as surgery was consulted.  Patient was treated conservatively with NG tube.  Patient tolerated removal of the NG tube.  Due to complaint of left-sided weakness a code stroke was called on 08/08/2019.  MRI brain was negative for any acute stroke.  Currently further plan is continue conservative measures for ileus and monitor for improvement with medical management.  Subjective: Tolerating clear liquid diet.  No nausea no vomiting.  No abdominal pain.  Passing gas.  Actually had a small bowel movement as well.  Abdomen still distended.  Assessment and Plan: Scheduled Meds: . aspirin  325 mg Oral Daily  . chlorhexidine  15 mL Mouth Rinse BID  . gabapentin  600 mg Oral TID  . heparin  5,000 Units Subcutaneous Q8H  . lactulose  20 g Oral Daily  . levETIRAcetam  500 mg Oral BID  . lisinopril  7.5 mg Oral Daily  . mouth rinse  15 mL Mouth Rinse q12n4p  . metoCLOPramide (REGLAN) injection  5 mg Intravenous Q8H  . metoprolol succinate  25 mg Oral Daily  . psyllium  1 packet Oral Daily  . simethicone  80 mg Oral QID  . tiotropium  18 mcg Inhalation Daily   Continuous Infusions: PRN Meds: acetaminophen, ALPRAZolam, ondansetron (ZOFRAN) IV, phenol  1.  Ileus No evidence of small bowel obstructions on the multiple x-ray. GI abdomen surgery were consulted. NG tube was inserted for decompression with significant  output. NG tube was removed on 08/05/2019. Patient tolerated clear liquid diet for now. GI initially recommended colonoscopy as well as endoscopy but due to inability to reach out to family for consent and patient's perceived lack of ability to provide consent as well as with the concern for acute stroke and other comorbidities currently recommend medical management and conservative measures with outpatient follow-up for colonoscopy and endoscopy. EKG on 08/09/2019 shows QT of 440. Will provide scheduled Reglan as well as Metamucil. Advance to full liquid diet for now. If tolerates very well without any problem he can even advance to soft diet later for dinner on 08/09/2019 otherwise will advance on 08/10/2019.  2. Acute metabolic encephalopathy. From polypharmacy. Patient was significantly drowsy and lethargic on 12/2 and 08/07/2019.   Suspect this is in the setting of polypharmacy with the use of IV Keppra as well as gabapentin as well as Xanax. We reduce the dose of the gabapentin and change the Xanax to as needed. We also switch IV Keppra to oral Keppra. Currently significant improvement in mentation.  Encephalopathy appears to have resolved.  3. Weakness. TIA versus acute CVA. Code stroke was called on 08/08/2019. Neurology consulted, appreciate assistance. MRI brain negative for any acute stroke. No further work-up or treatment change recommended by neurology. PT OT consult pending. Speech therapy consult recommended no concern for swallowing or speech. Continue telemetry monitoring.  4.  Acute strep agalactiae UTI.   Treated with IV antibiotics. Currently no  evidence of acute infection. Completed course.  5.  Hypokalemia. Mild hyponatremia. Replaced. Currently potassium level stable. Sodium level also stable treated with IV fluids.  6.  Relative B12 deficiency Patient was given subcu B12 injection we will continue with oral B12.  7.  Exposure to COVID-19 Patient is from  nursing home where there is an outbreak for COVID-19. Repeat coronavirus test performed which was negative. Patient does not require any isolation for now. Very low clinical suspicion for patient suffering from COVID-19 and is still asymptomatic.  8.  Peripheral vascular disease with carotid artery stenosis Bilateral moderate carotid artery stenosis. We will provide vascular referral outpatient.  9.  Prolonged QT interval on admission/currently resolved QT interval is prolonged on the time of admission. Multiple repeat EKG shows QT is currently normal. Monitor.  10.  History of seizure disorder Does not appear to be suffering from any acute seizures. Patient is on gabapentin 800 mg 3 times daily. Dose reduced to 600 mg 3 times daily secondary to changes in mental status. Patient was also on Keppra 500 mg twice daily which we will continue.  11.  Anxiety.  Mood disorder. Continuing Xanax. Dose changed from scheduled to as needed. Continue Zoloft.  12.  COPD. Continue Spiriva.  13.  Essential hypertension. Resume home blood pressure medication lisinopril as well as Toprol-XL.  Diet: Full liquid diet  DVT Prophylaxis: Subcutaneous Lovenox   Advance goals of care discussion: Full code  Family Communication: Unable to reach family despite multiple attempts.  Multiple provider as well as ancillary services has attempted to reach the family number provided in the chart.  Patient does not have any further information regarding how to reach out to the family.  Disposition:  Discharge to SNF.  Consultants: Neurology, General surgery and GI currently signed off Procedures: Echocardiogram, NG tube insertion  Antibiotics: Anti-infectives (From admission, onward)   Start     Dose/Rate Route Frequency Ordered Stop   08/05/19 1300  ampicillin (OMNIPEN) 1 g in sodium chloride 0.9 % 100 mL IVPB  Status:  Discontinued     1 g 300 mL/hr over 20 Minutes Intravenous Every 6 hours 08/05/19  1217 08/07/19 1326   08/04/19 1500  cefTRIAXone (ROCEPHIN) 1 g in sodium chloride 0.9 % 100 mL IVPB  Status:  Discontinued     1 g 200 mL/hr over 30 Minutes Intravenous Every 24 hours 08/03/19 1719 08/05/19 1217   08/03/19 1530  cefTRIAXone (ROCEPHIN) 1 g in sodium chloride 0.9 % 100 mL IVPB     1 g 200 mL/hr over 30 Minutes Intravenous  Once 08/03/19 1527 08/03/19 1858       Objective: Physical Exam: Vitals:   08/09/19 0204 08/09/19 0349 08/09/19 0621 08/09/19 0812  BP: (!) 144/91 (!) 154/84 (!) 157/98 (!) 168/89  Pulse: 75 71 79 79  Resp: 16 20 16 15   Temp: 98.4 F (36.9 C) 98.3 F (36.8 C) 98.4 F (36.9 C) 97.8 F (36.6 C)  TempSrc: Oral Oral Oral Oral  SpO2: 95% 99% 98% 99%  Weight:      Height:        Intake/Output Summary (Last 24 hours) at 08/09/2019 1132 Last data filed at 08/09/2019 0900 Gross per 24 hour  Intake 3469.26 ml  Output 650 ml  Net 2819.26 ml   Filed Weights   08/03/19 1003 08/03/19 2218  Weight: 98.9 kg 96.4 kg   General: alert and oriented to time, place, and person. Appear in moderate distress, affect appropriate Eyes:  PERRL, Conjunctiva normal ENT: Oral Mucosa Clear, moist  Neck: no JVD, no Abnormal Mass Or lumps Cardiovascular: S1 and S2 Present, no Murmur,  Respiratory: good respiratory effort, Bilateral Air entry equal and Decreased, no signs of accessory muscle use, Clear to Auscultation, no Crackles, no wheezes Abdomen: Bowel Sound present, Soft and distended, no tenderness, no hernia Skin: no rashes  Extremities: no Pedal edema, no calf tenderness Neurologic: Mild left-sided weakness, left-sided vision loss chronic Gait not checked due to patient safety concerns  Data Reviewed: I have personally reviewed and interpreted daily labs, tele strips, imagings as discussed above. I reviewed all nursing notes, pharmacy notes, vitals, pertinent old records I have discussed plan of care as described above with RN and  patient/family.  CBC: Recent Labs  Lab 08/03/19 1009 08/04/19 0520 08/07/19 0647 08/08/19 0830  WBC 6.9 5.6 3.5* 4.1  NEUTROABS 5.4  --  2.3 2.4  HGB 13.7 12.7* 12.5* 12.6*  HCT 42.2 40.6 38.1* 38.0*  MCV 92.1 94.9 90.9 89.2  PLT 195 191 163 [id number]   Basic Metabolic Panel: Recent Labs  Lab 08/03/19 1009 08/04/19 0520 08/05/19 0502 08/06/19 0436 08/07/19 0647 08/08/19 0830  NA 143 145 146* 145 142 141  K 2.8* 3.2* 3.7 4.1 4.1 4.0  CL 99 105 113* 113* 111 108  CO2 29 28 26 25 23 22   GLUCOSE 106* 131* 112* 104* 100* 83  BUN 9 9 7* 8 8 8   CREATININE 1.25* 1.03 1.07 1.07 1.02 1.11  CALCIUM 9.1 8.8* 8.4* 8.6* 8.4* 8.8*  MG 2.1 2.3  --  2.1 2.0 1.9  PHOS 3.5  --   --  2.3*  --   --     Liver Function Tests: Recent Labs  Lab 08/03/19 1009 08/07/19 0647 08/08/19 0830  AST 16 13* 16  ALT 14 9 13   ALKPHOS 62 48 51  BILITOT 0.8 0.8 0.7  PROT 7.4 6.8 7.0  ALBUMIN 4.0 3.3* 3.5   Recent Labs  Lab 08/03/19 1009  LIPASE 22   Recent Labs  Lab 08/07/19 0647  AMMONIA 27   Coagulation Profile: No results for input(s): INR, PROTIME in the last 168 hours. Cardiac Enzymes: No results for input(s): CKTOTAL, CKMB, CKMBINDEX, TROPONINI in the last 168 hours. BNP (last 3 results) No results for input(s): PROBNP in the last 8760 hours. CBG: Recent Labs  Lab 08/06/19 1337 08/08/19 1010  GLUCAP 72 82   Studies: Mr Brain Wo Contrast  Result Date: 08/08/2019 CLINICAL DATA:  Focal neuro deficit, greater than 6 hours, stroke suspected. Additional history provided: RN called rapid response as patient reported left side of face was numb. EXAM: MRI HEAD WITHOUT CONTRAST TECHNIQUE: Multiplanar, multiecho pulse sequences of the brain and surrounding structures were obtained without intravenous contrast. COMPARISON:  Brain MRI 10/01/2017 FINDINGS: Brain: There is no evidence of acute infarct. No evidence of intracranial mass. No midline shift or extra-axial fluid collection. No  chronic intracranial blood products. 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 There is a background of moderate chronic small vessel ischemic disease. Moderate generalized parenchymal atrophy. 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 Flow voids otherwise maintained within the proximal large arterial vessels. Skull and upper cervical spine: No focal marrow lesion. Incompletely assessed  upper cervical spondylosis with multilevel spinal canal stenosis. Sinuses/Orbits: Visualized orbits demonstrate no acute abnormality. Minimal scattered paranasal sinus mucosal thickening. Right sphenoid sinus mucous retention cyst, unchanged. No significant mastoid effusion. IMPRESSION: 1. No evidence of acute intracranial abnormality, including acute infarct. 2. 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 3. 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 4. Background of moderate generalized parenchymal atrophy and chronic small vessel ischemic disease. 5. 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 6. Incompletely assessed upper cervical spondylosis with spinal canal stenosis. Electronically Signed   By: Kellie Simmering DO   On: 08/08/2019 21:32   US Carotid Bilateral (at [facility] And [facility] Only)  Result Date: 08/09/2019 CLINICAL DATA:  TIA. Hypertension, coronary artery disease, previous tobacco abuse EXAM: BILATERAL CAROTID DUPLEX ULTRASOUND TECHNIQUE: Pearline Cables scale imaging, color Doppler and duplex ultrasound were performed of bilateral carotid and vertebral  arteries in the neck. COMPARISON:  CTA neck 10/02/2017 FINDINGS: Criteria: Quantification of carotid stenosis is based on velocity parameters that correlate the residual internal carotid diameter with NASCET-based stenosis levels, using the diameter of the distal internal carotid lumen as the denominator for stenosis measurement. The following velocity measurements were obtained: RIGHT ICA: 148/34 cm/sec CCA: 123456 cm/sec SYSTOLIC ICA/CCA RATIO:  1.9 ECA: 58 cm/sec LEFT ICA: 175/38 cm/sec CCA: 0000000 cm/sec SYSTOLIC ICA/CCA RATIO:  3.4 ECA: 118 cm/sec RIGHT CAROTID ARTERY: Mild tortuosity. Eccentric noncalcified plaque in the distal common carotid artery and bulb without high-grade stenosis. Noncalcified plaque in the distal ICA with elevated peak systolic velocities. RIGHT VERTEBRAL ARTERY:  Normal flow direction and waveform. LEFT CAROTID ARTERY: Mild tortuosity. Partially calcified plaque in the bulb and proximal ICA. Noncalcified plaque extends through the mid and distal ICA resulting in at least mild stenosis, with elevated peak systolic velocities, probably overestimated due to poor angle correction. LEFT VERTEBRAL ARTERY:  Normal flow direction and waveform. IMPRESSION: 1. 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 2.  Antegrade bilateral vertebral arterial flow. Electronically Signed   By: Lucrezia Europe M.D.   On: 08/09/2019 09:15     Time spent: 35 minutes  Author: Berle Mull, MD [facility] 08/09/2019 11:32 AM  To reach On-call, see care teams to locate the attending and reach out to them via [email or web address]. If 7PM-7AM, please contact night-coverage If you still have difficulty reaching the attending provider, please page the [facility] (Director on Call) for [facility] on amion for assistance.

## 2019-08-09 NOTE — Progress Notes (Signed)
Physical Therapy Treatment Patient Details Name: Calvin [name] MRN: [id number] DOB: [birth year] Today's Date: 08/09/2019    History of Present Illness Antelmo Gabrielse is a 62yoM who comes to [facility] on 11/29 with nausea, vomiting, abdominal pain and abdominal distention. Findings suggestive of ileus or enteritis per CT scan report.  NGT was placed. PMH: Olgivie syndrome, alcohol abuse, anxiety, seizure disorder, COPD, depression, hypertension, CAD, stroke x2 c Left hemiplegia. PTA pt AMB short distance in room, used WC for longer distances.    PT Comments    Pt is able to complete seated exercises at EOB with therapist as well as ambulate limited distances in his room. He is able to ambulate 2 laps in room to door and back to bed. CGA from therapist with infrequent minA+1 due to stumbling especially during turns. Pt requires frequent cues for safety with turns and to stay within confines of walker as he tends to push it out in front of his body. At baseline pt ambulates limited distances in his facility. He appears close to his baseline and is safe to return to his ALF when medically appropriate. He would benefit from PT at his facility to improve his balance, endurance, and ambulatory abilities. Pt will benefit from PT services to address deficits in strength, balance, and mobility in order to return to full function at home.    Follow Up Recommendations  Home health PT;Supervision/Assistance - 24 hour;Supervision for mobility/OOB(Return to ALF)     Equipment Recommendations  None recommended by PT(Use walker at discharge for ambulation)    Recommendations for Other Services       Precautions / Restrictions Precautions Precautions: Fall Restrictions Weight Bearing Restrictions: No    Mobility  Bed Mobility Overal bed mobility: Modified Independent             General bed mobility comments: Pt moves slowly but is able to get to EOB and scoot forward without  assistance  Transfers Overall transfer level: Needs assistance Equipment used: Rolling walker (2 wheeled) Transfers: Sit to/from Stand Sit to Stand: Min guard         General transfer comment: Pt is able to stand at EOB without external assist from therapist. He moves slowly but once in standing he is stable without external support from therapist  Ambulation/Gait Ambulation/Gait assistance: Min assist Gait Distance (Feet): 40 Feet Assistive device: Rolling walker (2 wheeled)       General Gait Details: Pt is able to ambulate 2 laps in room to door and back to bed. CGA from therapist with infrequent minA+1 due to stumbling especially during turns. Pt requires frequent cues for safety with turns and to stay within confines of walker as he tends to push it out in front of his body   Stairs             Wheelchair Mobility    Modified Rankin (Stroke Patients Only)       Balance Overall balance assessment: Needs assistance;History of Falls Sitting-balance support: No upper extremity supported Sitting balance-Leahy Scale: Fair     Standing balance support: No upper extremity supported Standing balance-Leahy Scale: Fair                              Cognition Arousal/Alertness: Awake/alert Behavior During Therapy: WFL for tasks assessed/performed Overall Cognitive Status: Within Functional Limits for tasks assessed  Exercises General Exercises - Lower Extremity Long Arc Quad: Both;10 reps Heel Slides: Both;10 reps Hip ABduction/ADduction: Both;10 reps Hip Flexion/Marching: Both;10 reps    General Comments        Pertinent Vitals/Pain Pain Assessment: No/denies pain    Home Living Family/patient expects to be discharged to:: Assisted living             Home Equipment: Wheelchair - manual;Walker - 4 wheels Additional Comments: Pt reports to live at a family care home    Prior  Function Level of Independence: Needs assistance  Gait / Transfers Assistance Needed: 4WW for bedroom distances, WC outside of room ADL's / Homemaking Assistance Needed: supervision to minAssist for bathing/dressing. Pt AMB short distances to BR only, typically uses a WC for facility moblility. Reports he doesn't walk much 2/2 to chronic Left hemiplegia. Comments: Pt feels his L side strength has not changed since events this a.m.   PT Goals (current goals can now be found in the care plan section) Acute Rehab PT Goals PT Goal Formulation: Patient unable to participate in goal setting Time For Goal Achievement: 08/20/19 Progress towards PT goals: Progressing toward goals    Frequency    Min 2X/week      PT Plan Current plan remains appropriate    Co-evaluation              AM-PAC PT "6 Clicks" Mobility   Outcome Measure  Help needed turning from your back to your side while in a flat bed without using bedrails?: None Help needed moving from lying on your back to sitting on the side of a flat bed without using bedrails?: None Help needed moving to and from a bed to a chair (including a wheelchair)?: None Help needed standing up from a chair using your arms (e.g., wheelchair or bedside chair)?: None Help needed to walk in hospital room?: A Little Help needed climbing 3-5 steps with a railing? : A Lot 6 Click Score: 21    End of Session Equipment Utilized During Treatment: Gait belt Activity Tolerance: Patient tolerated treatment well;No increased pain Patient left: with call bell/phone within reach;in bed;with bed alarm set Nurse Communication: Mobility status(condom catheter was off upon arrival, new sheets needed) PT Visit Diagnosis: Difficulty in walking, not elsewhere classified (R26.2);Muscle weakness (generalized) (M62.81);Other abnormalities of gait and mobility (R26.89)     Time: [id number] PT Time Calculation (min) (ACUTE ONLY): 20 min  Charges:  $Therapeutic  Exercise: 8-22 mins                    Lyndel Safe Dannae Kato PT, DPT, GCS    Donzell Coller 08/09/2019, 11:58 AM

## 2019-08-10 ENCOUNTER — Inpatient Hospital Stay: Payer: Medicare Other

## 2019-08-10 DIAGNOSIS — K567 Ileus, unspecified: Secondary | ICD-10-CM | POA: Diagnosis not present

## 2019-08-10 DIAGNOSIS — E876 Hypokalemia: Secondary | ICD-10-CM | POA: Diagnosis not present

## 2019-08-10 DIAGNOSIS — N390 Urinary tract infection, site not specified: Secondary | ICD-10-CM | POA: Diagnosis not present

## 2019-08-10 LAB — BASIC METABOLIC PANEL
Anion gap: 10 (ref 5–15)
BUN: 8 mg/dL (ref 8–23)
CO2: 20 mmol/L — ABNORMAL LOW (ref 22–32)
Calcium: 8.6 mg/dL — ABNORMAL LOW (ref 8.9–10.3)
Chloride: 111 mmol/L (ref 98–111)
Creatinine, Ser: 0.92 mg/dL (ref 0.61–1.24)
GFR calc Af Amer: >60 mL/min (ref >60)
GFR calc non Af Amer: >60 mL/min (ref >60)
Glucose, Bld: 80 mg/dL (ref 70–99)
Potassium: 3.7 mmol/L (ref 3.5–5.1)
Sodium: 141 mmol/L (ref 135–145)

## 2019-08-10 LAB — MAGNESIUM: Magnesium: 2 mg/dL (ref 1.7–2.4)

## 2019-08-10 MED ORDER — BUTALBITAL-APAP-CAFFEINE 50-325-40 MG PO TABS
1.0000 | ORAL_TABLET | Freq: Four times a day (QID) | ORAL | Status: DC | PRN
  Administered 2019-08-10 – 2019-08-11 (×3): 1 via ORAL
  Filled 2019-08-10 (×3): qty 1

## 2019-08-10 NOTE — Progress Notes (Signed)
[facility] Progress Note  Patient: Calvin [name] [id number]   PCP: Lynnell Jude, MD DOB: 08/12/1951   DOA: 08/03/2019   DOS: 08/10/2019   Date of Service: the patient was seen and examined on 08/10/2019  Chief Complaint  Patient presents with  . Abdominal Pain   Brief hospital course: Pt. with PMH of bloody syndrome, alcohol abuse, anxiety, seizure disorder, CVA with residual left-sided weakness, COPD, depression, HTN, CAD; presented with complain of nausea and vomiting as well as abdominal pain and distention, was found to have recurrent ileus. GI was consulted as well as surgery was consulted.  Patient was treated conservatively with NG tube.  Patient tolerated removal of the NG tube.  Due to complaint of left-sided weakness a code stroke was called on 08/08/2019.  MRI brain was negative for any acute stroke.  Currently further plan is continue conservative measures for ileus and monitor for improvement with medical management.  Subjective: Tolerating liquid diet. Denies any nausea or vomiting. Denies any abdominal pain but abdomen appears to be distended. Actually had 2 bowel movements last night.  Assessment and Plan: Scheduled Meds: . aspirin  325 mg Oral Daily  . chlorhexidine  15 mL Mouth Rinse BID  . gabapentin  600 mg Oral TID  . heparin  5,000 Units Subcutaneous Q8H  . lactulose  20 g Oral Daily  . levETIRAcetam  500 mg Oral BID  . lisinopril  7.5 mg Oral Daily  . mouth rinse  15 mL Mouth Rinse q12n4p  . metoCLOPramide (REGLAN) injection  5 mg Intravenous Q8H  . metoprolol succinate  25 mg Oral Daily  . psyllium  1 packet Oral Daily  . simethicone  80 mg Oral QID  . tiotropium  18 mcg Inhalation Daily   Continuous Infusions: PRN Meds: acetaminophen, ALPRAZolam, butalbital-acetaminophen-caffeine, ondansetron (ZOFRAN) IV, phenol  1.  Ileus No evidence of small bowel obstructions on the multiple x-ray. GI abdomen surgery were consulted. NG tube was  inserted for decompression with significant output. NG tube was removed on 08/05/2019. Patient tolerated clear liquid diet for now. GI initially recommended colonoscopy as well as endoscopy but due to inability to reach out to family for consent and patient's perceived lack of ability to provide consent as well as with the concern for acute stroke and other comorbidities currently recommend medical management and conservative measures with outpatient follow-up for colonoscopy and endoscopy. EKG on 08/09/2019 shows QT of 440. Will provide scheduled Reglan as well as Metamucil. Advance to regular diet Check x-ray abdomen.  2. Acute metabolic encephalopathy. From polypharmacy. Patient was significantly drowsy and lethargic on 12/2 and 08/07/2019.   Suspect this is in the setting of polypharmacy with the use of IV Keppra as well as gabapentin as well as Xanax. We reduce the dose of the gabapentin and change the Xanax to as needed. We also switch IV Keppra to oral Keppra. Currently significant improvement in mentation.  Encephalopathy appears to have resolved.  3. Weakness. TIA versus acute CVA. Code stroke was called on 08/08/2019. Neurology consulted, appreciate assistance. MRI brain negative for any acute stroke. No further work-up or treatment change recommended by neurology. PT OT consult recommends going back to ALF with home health. Speech therapy consult recommended no concern for swallowing or speech. Continue telemetry monitoring.  4.  Acute strep agalactiae UTI.   Treated with IV antibiotics. Currently no evidence of acute infection. Completed course.  5.  Hypokalemia. Mild hyponatremia. Replaced. Currently potassium level stable. Sodium level also  stable treated with IV fluids.  6.  Relative B12 deficiency Patient was given subcu B12 injection we will continue with oral B12.  7.  Exposure to COVID-19 Patient is from nursing home where there is an outbreak for  COVID-19. Repeat coronavirus test performed which was negative. Patient does not require any isolation for now. Very low clinical suspicion for patient suffering from COVID-19 and is still asymptomatic.  8.  Peripheral vascular disease with carotid artery stenosis Bilateral moderate carotid artery stenosis. We will provide vascular referral outpatient.  9.  Prolonged QT interval on admission/currently resolved QT interval is prolonged on the time of admission. Multiple repeat EKG shows QT is currently normal. Monitor.  10.  History of seizure disorder Does not appear to be suffering from any acute seizures. Patient is on gabapentin 800 mg 3 times daily. Dose reduced to 600 mg 3 times daily secondary to changes in mental status. Patient was also on Keppra 500 mg twice daily which we will continue.  11.  Anxiety.  Mood disorder. Continuing Xanax. Dose changed from scheduled to as needed. Continue Zoloft.  12.  COPD. Continue Spiriva.  13.  Essential hypertension. Resume home blood pressure medication lisinopril as well as Toprol-XL.  14.  Headache. No focal deficit. ?  Sleep apnea. Add Fioricet.  Diet: Cardiac diet DVT Prophylaxis: Subcutaneous Lovenox   Advance goals of care discussion: Full code  Family Communication: Unable to reach family despite multiple attempts.  Multiple provider as well as ancillary services has attempted to reach the family number provided in the chart.  Patient does not have any further information regarding how to reach out to the family.  Disposition:  Discharge to ALF with home health.  Consultants: Neurology, General surgery and GI currently signed off Procedures: Echocardiogram, NG tube insertion  Antibiotics: Anti-infectives (From admission, onward)   Start     Dose/Rate Route Frequency Ordered Stop   08/05/19 1300  ampicillin (OMNIPEN) 1 g in sodium chloride 0.9 % 100 mL IVPB  Status:  Discontinued     1 g 300 mL/hr over 20  Minutes Intravenous Every 6 hours 08/05/19 1217 08/07/19 1326   08/04/19 1500  cefTRIAXone (ROCEPHIN) 1 g in sodium chloride 0.9 % 100 mL IVPB  Status:  Discontinued     1 g 200 mL/hr over 30 Minutes Intravenous Every 24 hours 08/03/19 1719 08/05/19 1217   08/03/19 1530  cefTRIAXone (ROCEPHIN) 1 g in sodium chloride 0.9 % 100 mL IVPB     1 g 200 mL/hr over 30 Minutes Intravenous  Once 08/03/19 1527 08/03/19 1858       Objective: Physical Exam: Vitals:   08/09/19 0812 08/09/19 1543 08/09/19 2048 08/10/19 0552  BP: (!) 168/89 (!) 164/96 (!) 148/84 (!) 160/88  Pulse: 79 70 68 68  Resp: 15 15 20 16   Temp: 97.8 F (36.6 C) 98.4 F (36.9 C) 98 F (36.7 C) 98.8 F (37.1 C)  TempSrc: Oral Oral Oral Oral  SpO2: 99% 97% 98% 99%  Weight:      Height:        Intake/Output Summary (Last 24 hours) at 08/10/2019 1057 Last data filed at 08/10/2019 0900 Gross per 24 hour  Intake 780 ml  Output 2075 ml  Net -1295 ml   Filed Weights   08/03/19 1003 08/03/19 2218  Weight: 98.9 kg 96.4 kg   General: alert and oriented to time, place, and person. Appear in mild distress, affect flat in affect Eyes: PERRL, Conjunctiva normal ENT:  Oral Mucosa Clear, moist  Neck: no JVD, no Abnormal Mass Or lumps Cardiovascular: S1 and S2 Present, no Murmur, peripheral pulses symmetrical Respiratory: good respiratory effort, Bilateral Air entry equal and Decreased, no signs of accessory muscle use, Clear to Auscultation, no Crackles, no wheezes Abdomen: Bowel Sound present, Soft and no tenderness, distended, no hernia Skin: no rashes  Extremities: no Pedal edema, no calf tenderness Neurologic: Left-sided weakness with left-sided vision loss which is all chronic Gait not checked due to patient safety concerns   Data Reviewed: I have personally reviewed and interpreted daily labs, tele strips, imagings as discussed above. I reviewed all nursing notes, pharmacy notes, vitals, pertinent old records I have  discussed plan of care as described above with RN and patient/family.  CBC: Recent Labs  Lab 08/04/19 0520 08/07/19 0647 08/08/19 0830  WBC 5.6 3.5* 4.1  NEUTROABS  --  2.3 2.4  HGB 12.7* 12.5* 12.6*  HCT 40.6 38.1* 38.0*  MCV 94.9 90.9 89.2  PLT 191 163 Q000111Q   Basic Metabolic Panel: Recent Labs  Lab 08/04/19 0520 08/05/19 0502 08/06/19 0436 08/07/19 0647 08/08/19 0830  NA 145 146* 145 142 141  K 3.2* 3.7 4.1 4.1 4.0  CL 105 113* 113* 111 108  CO2 28 26 25 23 22   GLUCOSE 131* 112* 104* 100* 83  BUN 9 7* 8 8 8   CREATININE 1.03 1.07 1.07 1.02 1.11  CALCIUM 8.8* 8.4* 8.6* 8.4* 8.8*  MG 2.3  --  2.1 2.0 1.9  PHOS  --   --  2.3*  --   --     Liver Function Tests: Recent Labs  Lab 08/07/19 0647 08/08/19 0830  AST 13* 16  ALT 9 13  ALKPHOS 48 51  BILITOT 0.8 0.7  PROT 6.8 7.0  ALBUMIN 3.3* 3.5   No results for input(s): LIPASE, AMYLASE in the last 168 hours. Recent Labs  Lab 08/07/19 0647  AMMONIA 27   Coagulation Profile: No results for input(s): INR, PROTIME in the last 168 hours. Cardiac Enzymes: No results for input(s): CKTOTAL, CKMB, CKMBINDEX, TROPONINI in the last 168 hours. BNP (last 3 results) No results for input(s): PROBNP in the last 8760 hours. CBG: Recent Labs  Lab 08/06/19 1337 08/08/19 1010  GLUCAP 72 82   Studies: No results found.   Time spent: 35 minutes  Author: Berle Mull, MD [facility] 08/10/2019 10:57 AM  To reach On-call, see care teams to locate the attending and reach out to them via [email or web address]. If 7PM-7AM, please contact night-coverage If you still have difficulty reaching the attending provider, please page the [facility] (Director on Call) for [facility] on amion for assistance.

## 2019-08-11 DIAGNOSIS — K567 Ileus, unspecified: Secondary | ICD-10-CM | POA: Diagnosis not present

## 2019-08-11 DIAGNOSIS — E876 Hypokalemia: Secondary | ICD-10-CM | POA: Diagnosis not present

## 2019-08-11 DIAGNOSIS — N390 Urinary tract infection, site not specified: Secondary | ICD-10-CM | POA: Diagnosis not present

## 2019-08-11 MED ORDER — LORAZEPAM 2 MG/ML IJ SOLN: 0.5000 mg | Freq: Once | INTRAMUSCULAR | Status: DC

## 2019-08-11 MED ORDER — DIPHENHYDRAMINE HCL 50 MG/ML IJ SOLN
50.0000 mg | Freq: Once | INTRAMUSCULAR | Status: AC
  Administered 2019-08-11: 18:00:00 50 mg via INTRAVENOUS
  Filled 2019-08-11: qty 1

## 2019-08-11 MED ORDER — GABAPENTIN 400 MG PO CAPS
800.0000 mg | ORAL_CAPSULE | Freq: Three times a day (TID) | ORAL | Status: DC
  Administered 2019-08-11 – 2019-08-14 (×9): 800 mg via ORAL
  Filled 2019-08-11 (×3): qty 2
  Filled 2019-08-11: qty 8
  Filled 2019-08-11 (×7): qty 2

## 2019-08-11 MED ORDER — HYDROCODONE-ACETAMINOPHEN 5-325 MG PO TABS
1.0000 | ORAL_TABLET | Freq: Four times a day (QID) | ORAL | Status: DC | PRN
  Administered 2019-08-11 – 2019-08-14 (×7): 1 via ORAL
  Filled 2019-08-11 (×8): qty 1

## 2019-08-11 MED ORDER — POLYETHYLENE GLYCOL 3350 17 G PO PACK
17.0000 g | PACK | Freq: Every day | ORAL | Status: DC
  Administered 2019-08-11 – 2019-08-14 (×3): 17 g via ORAL
  Filled 2019-08-11 (×4): qty 1

## 2019-08-11 MED ORDER — AMLODIPINE BESYLATE 5 MG PO TABS
5.0000 mg | ORAL_TABLET | Freq: Every day | ORAL | Status: DC
  Administered 2019-08-11 – 2019-08-13 (×3): 5 mg via ORAL
  Filled 2019-08-11 (×4): qty 1

## 2019-08-11 MED ORDER — DIPHENHYDRAMINE HCL 50 MG/ML IJ SOLN
25.0000 mg | Freq: Four times a day (QID) | INTRAMUSCULAR | Status: DC | PRN
  Administered 2019-08-12 – 2019-08-13 (×5): 25 mg via INTRAVENOUS
  Filled 2019-08-11 (×5): qty 1

## 2019-08-11 MED ORDER — ACETAMINOPHEN 325 MG PO TABS
650.0000 mg | ORAL_TABLET | ORAL | Status: DC | PRN
  Administered 2019-08-14: 650 mg via ORAL
  Filled 2019-08-11: qty 2

## 2019-08-11 MED ORDER — TROLAMINE SALICYLATE 10 % EX CREA
TOPICAL_CREAM | CUTANEOUS | Status: DC | PRN
  Administered 2019-08-11 – 2019-08-14 (×3): via TOPICAL
  Filled 2019-08-11: qty 85

## 2019-08-11 MED ORDER — LORAZEPAM 2 MG/ML IJ SOLN
0.5000 mg | Freq: Once | INTRAMUSCULAR | Status: AC
  Administered 2019-08-11: 17:00:00 0.5 mg via INTRAVENOUS
  Filled 2019-08-11: qty 1

## 2019-08-11 MED ORDER — ALPRAZOLAM 0.5 MG PO TABS
0.5000 mg | ORAL_TABLET | Freq: Three times a day (TID) | ORAL | Status: DC
  Administered 2019-08-11 – 2019-08-13 (×5): 0.5 mg via ORAL
  Filled 2019-08-11 (×5): qty 1

## 2019-08-11 MED ORDER — DIPHENHYDRAMINE HCL 50 MG/ML IJ SOLN: 25.0000 mg | Freq: Once | INTRAMUSCULAR | Status: DC

## 2019-08-11 NOTE — Progress Notes (Addendum)
[facility] Progress Note  Patient: Calvin [name] [id number]   PCP: Lynnell Jude, MD DOB: 08/24/1951   DOA: 08/03/2019   DOS: 08/11/2019   Date of Service: the patient was seen and examined on 08/11/2019  Chief Complaint  Patient presents with  . Abdominal Pain   Brief hospital course: Pt. with PMH of bloody syndrome, alcohol abuse, anxiety, seizure disorder, CVA with residual left-sided weakness, COPD, depression, HTN, CAD; presented with complain of nausea and vomiting as well as abdominal pain and distention, was found to have recurrent ileus. GI was consulted as well as surgery was consulted.  Patient was treated conservatively with NG tube.  Patient tolerated removal of the NG tube.  Due to complaint of left-sided weakness a code stroke was called on 08/08/2019. MRI brain was negative for any acute stroke.  Currently further plan is continue conservative measures for ileus and monitor for improvement with medical management.  Subjective: Tolerating soft diet. Denies any nausea or vomiting. Denies any abdominal pain but abdomen continues to appear to be distended. Has small bowel movements last night.  Later in the evening the patient started having twitching movements of left upper extremity left leg and left side of his face.  Assessment and Plan: Scheduled Meds: . aspirin  325 mg Oral Daily  . chlorhexidine  15 mL Mouth Rinse BID  . gabapentin  600 mg Oral TID  . heparin  5,000 Units Subcutaneous Q8H  . lactulose  20 g Oral Daily  . levETIRAcetam  500 mg Oral BID  . lisinopril  7.5 mg Oral Daily  . mouth rinse  15 mL Mouth Rinse q12n4p  . metoCLOPramide (REGLAN) injection  5 mg Intravenous Q8H  . metoprolol succinate  25 mg Oral Daily  . polyethylene glycol  17 g Oral Daily  . simethicone  80 mg Oral QID  . tiotropium  18 mcg Inhalation Daily   Continuous Infusions: PRN Meds: acetaminophen, ALPRAZolam, butalbital-acetaminophen-caffeine, ondansetron (ZOFRAN) IV,  phenol  1. Ileus No evidence of small bowel obstructions on the multiple x-ray. GI abdomen surgery were consulted. NG tube was inserted for decompression with significant output. NG tube was removed on 08/05/2019. Patient tolerated clear liquid diet for now. GI initially recommended colonoscopy as well as endoscopy but due to inability to reach out to family for consent and patient's perceived lack of ability to provide consent as well as with the concern for acute stroke and other comorbidities currently recommend medical management and conservative measures with outpatient follow-up for colonoscopy and endoscopy. EKG on 08/09/2019 shows QT of 440. Will provide scheduled Reglan as well as miralax. pharmacy does not have metamucil. Advance to regular diet.  2. Acute metabolic encephalopathy. From polypharmacy. Patient was significantly drowsy and lethargic on 12/2 and 08/07/2019.   Suspect this is in the setting of polypharmacy with the use of IV Keppra as well as gabapentin as well as Xanax. We reduce the dose of the gabapentin and change the Xanax to as needed. We also switch IV Keppra to oral Keppra. Currently significant improvement in mentation.   Encephalopathy appears to have resolved.  3. Weakness. TIA versus acute CVA. Code stroke was called on 08/08/2019. Neurology consulted, appreciate assistance. MRI brain negative for any acute stroke. No further work-up or treatment change recommended by neurology. PT OT consult recommends going back to ALF with home health. Speech therapy consult recommended no concern for swallowing or speech. Continue telemetry monitoring.  4.  Acute strep agalactiae UTI.  Treated with IV antibiotics. Currently no evidence of acute infection. Completed course.  5.  Hypokalemia. Mild hyponatremia. Replaced. Currently potassium level stable. Sodium level also stable treated with IV fluids.  6.  Relative B12 deficiency Patient was given subcu  B12 injection we will continue with oral B12.  7.  Exposure to COVID-19 No active covid.  Patient is from nursing home where there is an outbreak for COVID-19. Repeat coronavirus test performed which was negative. Patient does not require any isolation for now. Very low clinical suspicion for patient suffering from COVID-19 and is still asymptomatic.  8.  Peripheral vascular disease with carotid artery stenosis Bilateral moderate carotid artery stenosis. We will provide vascular referral outpatient.  9.  Prolonged QT interval on admission/currently resolved QT interval is prolonged on the time of admission. Multiple repeat EKG shows QT is currently normal. Monitor.  10.  History of seizure disorder Does not appear to be suffering from any acute seizures. Patient is on gabapentin 800 mg 3 times daily. Resuming the home dose. Patient was also on Keppra 500 mg twice daily which we will continue.  11.  Anxiety.  Mood disorder. Continuing Xanax. Dose changed from scheduled to as needed. Continue Zoloft.  12.  COPD. Continue Spiriva.  13.  Essential hypertension. Resume home blood pressure medication lisinopril as well as Toprol-XL.  14.  Headache. No focal deficit. ?  Sleep apnea. Add Fioricet.  15.  Possible tardive dyskinesia. In the setting of recent Reglan use. Discontinue Reglan. Starting the patient on Benadryl as needed. 1 dose of scheduled.  Benadryl. Also resuming patient's home Xanax.  Diet: Cardiac diet DVT Prophylaxis: Subcutaneous Lovenox   Advance goals of care discussion: Full code  Family Communication: Unable to reach family despite multiple attempts.  Multiple provider as well as ancillary services has attempted to reach the family number provided in the chart.  Patient does not have any further information regarding how to reach out to the family.  Disposition:  Discharge to ALF with home health.  Consultants: Neurology, General surgery and GI  currently signed off Procedures: Echocardiogram, NG tube insertion  Antibiotics: Anti-infectives (From admission, onward)   Start     Dose/Rate Route Frequency Ordered Stop   08/05/19 1300  ampicillin (OMNIPEN) 1 g in sodium chloride 0.9 % 100 mL IVPB  Status:  Discontinued     1 g 300 mL/hr over 20 Minutes Intravenous Every 6 hours 08/05/19 1217 08/07/19 1326   08/04/19 1500  cefTRIAXone (ROCEPHIN) 1 g in sodium chloride 0.9 % 100 mL IVPB  Status:  Discontinued     1 g 200 mL/hr over 30 Minutes Intravenous Every 24 hours 08/03/19 1719 08/05/19 1217   08/03/19 1530  cefTRIAXone (ROCEPHIN) 1 g in sodium chloride 0.9 % 100 mL IVPB     1 g 200 mL/hr over 30 Minutes Intravenous  Once 08/03/19 1527 08/03/19 1858       Objective: Physical Exam: Vitals:   08/10/19 1608 08/10/19 1945 08/11/19 0509 08/11/19 1108  BP: 122/77 130/90 (!) 172/91 136/76  Pulse: 78 70 68 68  Resp: 18 18 18    Temp: 98.5 F (36.9 C) 98.1 F (36.7 C) (!) 97.5 F (36.4 C)   TempSrc: Oral Oral Oral   SpO2: 96% 98% 98%   Weight:      Height:        Intake/Output Summary (Last 24 hours) at 08/11/2019 1456 Last data filed at 08/11/2019 1300 Gross per 24 hour  Intake 480 ml  Output 1200 ml  Net -720 ml   Filed Weights   08/03/19 1003 08/03/19 2218  Weight: 98.9 kg 96.4 kg   General: alert and oriented to time, place, and person. Appear in mild distress, affect flat in affect Eyes: PERRL, Conjunctiva normal ENT: Oral Mucosa Clear, moist  Neck: no JVD, no Abnormal Mass Or lumps Cardiovascular: S1 and S2 Present, no Murmur, peripheral pulses symmetrical Respiratory: good respiratory effort, Bilateral Air entry equal and Decreased, no signs of accessory muscle use, Clear to Auscultation, no Crackles, no wheezes Abdomen: Bowel Sound present, Soft and no tenderness, distended, no hernia Skin: no rashes  Extremities: no Pedal edema, no calf tenderness Neurologic: Left-sided weakness with left-sided vision loss  which is all chronic Gait not checked due to patient safety concerns   Data Reviewed: I have personally reviewed and interpreted daily labs, tele strips, imagings as discussed above. I reviewed all nursing notes, pharmacy notes, vitals, pertinent old records I have discussed plan of care as described above with RN and patient/family.  CBC: Recent Labs  Lab 08/07/19 0647 08/08/19 0830  WBC 3.5* 4.1  NEUTROABS 2.3 2.4  HGB 12.5* 12.6*  HCT 38.1* 38.0*  MCV 90.9 89.2  PLT 163 Q000111Q   Basic Metabolic Panel: Recent Labs  Lab 08/05/19 0502 08/06/19 0436 08/07/19 0647 08/08/19 0830 08/10/19 1057  NA 146* 145 142 141 141  K 3.7 4.1 4.1 4.0 3.7  CL 113* 113* 111 108 111  CO2 26 25 23 22  20*  GLUCOSE 112* 104* 100* 83 80  BUN 7* 8 8 8 8   CREATININE 1.07 1.07 1.02 1.11 0.92  CALCIUM 8.4* 8.6* 8.4* 8.8* 8.6*  MG  --  2.1 2.0 1.9 2.0  PHOS  --  2.3*  --   --   --     Liver Function Tests: Recent Labs  Lab 08/07/19 0647 08/08/19 0830  AST 13* 16  ALT 9 13  ALKPHOS 48 51  BILITOT 0.8 0.7  PROT 6.8 7.0  ALBUMIN 3.3* 3.5   No results for input(s): LIPASE, AMYLASE in the last 168 hours. Recent Labs  Lab 08/07/19 0647  AMMONIA 27   Coagulation Profile: No results for input(s): INR, PROTIME in the last 168 hours. Cardiac Enzymes: No results for input(s): CKTOTAL, CKMB, CKMBINDEX, TROPONINI in the last 168 hours. BNP (last 3 results) No results for input(s): PROBNP in the last 8760 hours. CBG: Recent Labs  Lab 08/06/19 1337 08/08/19 1010  GLUCAP 72 82   Studies: No results found.   Time spent: 35 minutes  Author: Berle Mull, MD [facility] 08/11/2019 2:56 PM  To reach On-call, see care teams to locate the attending and reach out to them via [email or web address]. If 7PM-7AM, please contact night-coverage If you still have difficulty reaching the attending provider, please page the [facility] (Director on Call) for [facility] on amion for assistance.

## 2019-08-11 NOTE — Progress Notes (Signed)
Pt complaining of severe neck and arm pain, on assessment patient has left facial droop, slurred speech, twitching of the the left side of face and left arm. When vitals were taken BP was 170/102. Dr Berle Mull was notified and came to the room to assess patient, orders placed.

## 2019-08-11 NOTE — Evaluation (Signed)
Occupational Therapy Evaluation Patient Details Name: Calvin [name] MRN: [id number] DOB: Aug 30, 1951 Today's Date: 08/11/2019    History of Present Illness Pt. is a 68 y.o. male who was admitted to [facility] on 11/29 with nausea, vomiting, abdominal pain and abdominal distention. CT scan indicated Ileus, an NGT was placed. PMH: Olgivie syndrome, alcohol abuse, anxiety, seizure disorder, COPD, depression, hypertension, CAD, stroke x2 c Left hemiplegia.   Clinical Impression   Pt. presents with weakness, limited LUE strength, motor control, and [place] skills, limited activity tolerance, and limited functional mobility which hinders his ability to complete basic ADL and IADL functioning. Pt. resides at ALF level of care at [facility]. Pt. reports that he required assist with morning care bathing, and dressing skills. The facility provides meals, and assists with meal tray set-up, as well as medication management. Pt. used a walker to get to and from the bathroom, and used a w/c outside of the room. Pt. performed LUE AROM/AAROM, and [facility] skills. Pt. was assisted with positioning upright in midline  In preparation for his meal. Pt. Was provided with full meal tray set-up, and requires items to be cut up for him. Pt. Could benefit from OT services for ADL training, A/E training, neuromuscular re-ed, UE there. Ex, and pt./caregiver education about DME, and positioning. Pt. Could benefit from ALF level of care at discharge with follow-up [place] services.    Follow Up Recommendations    ALF with HHOT follow-up services.   Equipment Recommendations  3 in 1 bedside commode    Recommendations for Other Services       Precautions / Restrictions Precautions Precautions: Fall Restrictions Weight Bearing Restrictions: No      Mobility Bed Mobility Overal bed mobility: Modified Independent Bed Mobility: Supine to Sit     Supine to sit: Mod assist        Transfers Overall transfer level: Needs  assistance Equipment used: Rolling walker (2 wheeled) Transfers: Sit to/from Stand Sit to Stand: Min guard         General transfer comment: Mobility Per PT report    Balance                                           ADL either performed or assessed with clinical judgement   ADL Overall ADL's : Needs assistance/impaired Eating/Feeding: Set up;Independent Eating/Feeding Details (indicate cue type and reason): Complete set-up was required with positioning, upright in midline, positioning meal tray, assist in opening containers, packets, and cutting food. Grooming: Set up;Min guard   Upper Body Bathing: Set up;Minimal assistance   Lower Body Bathing: Set up;Moderate assistance   Upper Body Dressing : Set up;Minimal assistance   Lower Body Dressing: Set up;Moderate assistance                       Vision Baseline Vision/History: Wears glasses Wears Glasses: At all times Patient Visual Report: No change from baseline       Perception     Praxis      Pertinent Vitals/Pain Pain Assessment: No/denies pain     Hand Dominance Right   Extremity/Trunk Assessment Upper Extremity Assessment Upper Extremity Assessment: LUE deficits/detail LUE Deficits / Details: Left shoulder flexion, and abduction 3/5, elbow flexion, extension 5/5. LUE Sensation: decreased proprioception LUE Coordination: decreased gross motor;decreased fine motor  Communication Communication Communication: No difficulties   Cognition Arousal/Alertness: Awake/alert Behavior During Therapy: WFL for tasks assessed/performed Overall Cognitive Status: Within Functional Limits for tasks assessed                                     General Comments       Exercises     Shoulder Instructions      Home Living Family/patient expects to be discharged to:: Assisted living(At [facility])                             Home Equipment:  Wheelchair - manual;Walker - 4 wheels          Prior Functioning/Environment Level of Independence: Needs assistance  Gait / Transfers Assistance Needed: 4WW for bedroom distances, WC outside of room ADL's / Homemaking Assistance Needed: Pt. reported requiring MinA  for morning bathing, and dressing. Pt. used a walker to,a nd from the bathroom, w/c use outside of his room. Facility provides meals, and assists with medication management.            OT Problem List: Decreased strength;Impaired UE functional use;Decreased coordination;Decreased knowledge of use of DME or AE      OT Treatment/Interventions: Self-care/ADL training    OT Goals(Current goals can be found in the care plan section) Acute Rehab OT Goals Patient Stated Goal: To return home OT Goal Formulation: With patient Potential to Achieve Goals: Good  OT Frequency: Min 2X/week   Barriers to D/C:            Co-evaluation              AM-PAC OT "6 Clicks" Daily Activity     Outcome Measure Help from another person eating meals?: A Little Help from another person taking care of personal grooming?: None Help from another person toileting, which includes using toliet, bedpan, or urinal?: A Little Help from another person bathing (including washing, rinsing, drying)?: A Lot Help from another person to put on and taking off regular upper body clothing?: A Little Help from another person to put on and taking off regular lower body clothing?: A Lot 6 Click Score: 17   End of Session Equipment Utilized During Treatment: Gait belt  Activity Tolerance: Patient tolerated treatment well Patient left: in bed  OT Visit Diagnosis: Muscle weakness (generalized) (M62.81)                Time: [id number] OT Time Calculation (min): 32 min Charges:  OT General Charges $OT Visit: 1 Visit OT Evaluation $OT Eval Moderate Complexity: 1 Mod Harrel Carina, MS, OTR/L Harrel Carina 08/11/2019, 10:51 AM

## 2019-08-12 DIAGNOSIS — K567 Ileus, unspecified: Secondary | ICD-10-CM | POA: Diagnosis not present

## 2019-08-12 DIAGNOSIS — R531 Weakness: Secondary | ICD-10-CM

## 2019-08-12 LAB — COMPREHENSIVE METABOLIC PANEL
ALT: 30 U/L (ref 0–44)
AST: 31 U/L (ref 15–41)
Albumin: 3.6 g/dL (ref 3.5–5.0)
Alkaline Phosphatase: 55 U/L (ref 38–126)
Anion gap: 10 (ref 5–15)
BUN: 9 mg/dL (ref 8–23)
CO2: 21 mmol/L — ABNORMAL LOW (ref 22–32)
Calcium: 8.8 mg/dL — ABNORMAL LOW (ref 8.9–10.3)
Chloride: 109 mmol/L (ref 98–111)
Creatinine, Ser: 1.02 mg/dL (ref 0.61–1.24)
GFR calc Af Amer: >60 mL/min (ref >60)
GFR calc non Af Amer: >60 mL/min (ref >60)
Glucose, Bld: 81 mg/dL (ref 70–99)
Potassium: 3.9 mmol/L (ref 3.5–5.1)
Sodium: 140 mmol/L (ref 135–145)
Total Bilirubin: 0.5 mg/dL (ref 0.3–1.2)
Total Protein: 6.9 g/dL (ref 6.5–8.1)

## 2019-08-12 LAB — CBC WITH DIFFERENTIAL/PLATELET
Abs Immature Granulocytes: 0.07 10*3/uL (ref 0.00–0.07)
Basophils Absolute: 0 10*3/uL (ref 0.0–0.1)
Basophils Relative: 1 %
Eosinophils Absolute: 0 10*3/uL (ref 0.0–0.5)
Eosinophils Relative: 1 %
HCT: 37.7 % — ABNORMAL LOW (ref 39.0–52.0)
Hemoglobin: 12.6 g/dL — ABNORMAL LOW (ref 13.0–17.0)
Immature Granulocytes: 2 %
Lymphocytes Relative: 28 %
Lymphs Abs: 1.2 10*3/uL (ref 0.7–4.0)
MCH: 29.6 pg (ref 26.0–34.0)
MCHC: 33.4 g/dL (ref 30.0–36.0)
MCV: 88.7 fL (ref 80.0–100.0)
Monocytes Absolute: 0.5 10*3/uL (ref 0.1–1.0)
Monocytes Relative: 11 %
Neutro Abs: 2.5 10*3/uL (ref 1.7–7.7)
Neutrophils Relative %: 57 %
Platelets: 229 10*3/uL (ref 150–400)
RBC: 4.25 MIL/uL (ref 4.22–5.81)
RDW: 12.3 % (ref 11.5–15.5)
WBC: 4.3 10*3/uL (ref 4.0–10.5)
nRBC: 0 % (ref 0.0–0.2)

## 2019-08-12 LAB — LACTIC ACID, PLASMA: Lactic Acid, Venous: 1 mmol/L (ref 0.5–1.9)

## 2019-08-12 LAB — MAGNESIUM: Magnesium: 1.9 mg/dL (ref 1.7–2.4)

## 2019-08-12 MED ORDER — LORAZEPAM 2 MG/ML IJ SOLN
0.5000 mg | Freq: Once | INTRAMUSCULAR | Status: AC
  Administered 2019-08-12: 0.5 mg via INTRAVENOUS
  Filled 2019-08-12: qty 1

## 2019-08-12 MED ORDER — ADULT MULTIVITAMIN W/MINERALS CH
1.0000 | ORAL_TABLET | Freq: Every day | ORAL | Status: DC
  Administered 2019-08-13 – 2019-08-14 (×2): 1 via ORAL
  Filled 2019-08-12 (×2): qty 1

## 2019-08-12 MED ORDER — DIPHENHYDRAMINE HCL 50 MG/ML IJ SOLN: 50.0000 mg | Freq: Four times a day (QID) | INTRAMUSCULAR | Status: DC

## 2019-08-12 MED ORDER — ENSURE ENLIVE PO LIQD
237.0000 mL | Freq: Three times a day (TID) | ORAL | Status: DC
  Administered 2019-08-12 – 2019-08-14 (×6): 237 mL via ORAL

## 2019-08-12 NOTE — Progress Notes (Signed)
Physical Therapy Treatment Patient Details Name: Calvin [name] MRN: [id number] DOB: August 01, 1951 Today's Date: 08/12/2019    History of Present Illness Pt. is a 68 y.o. male who was admitted to [facility] on 11/29 with nausea, vomiting, abdominal pain and abdominal distention. CT scan indicated Ileus, an NGT was placed. PMH: Olgivie syndrome, alcohol abuse, anxiety, seizure disorder, COPD, depression, hypertension, CAD, stroke x2 c Left hemiplegia.    PT Comments    Chart reviewed and discussed with RN prior to session. Per notes yesterday pt with c/o severe neck and arm pain, slurred speech and twitching L side of face and arm along with slurred speech.    Pt initially difficulty to understand but speech improved with time.  Twitching remains today and also c/o pain L hand and arm into his head. Pt often stops to hold his head during session.  He is able to complete LE ex in supine and has a fairly good grip L hand.  Asked pt if he wants to attempt sitting edge of bed today but he was hesitant and did not initiate movement.  He did not elaborate why other than he was awaiting his medications.  Will request PT see pt next session.      Follow Up Recommendations  SNF     Equipment Recommendations  None recommended by PT    Recommendations for Other Services       Precautions / Restrictions Precautions Precautions: Fall Restrictions Weight Bearing Restrictions: No    Mobility  Bed Mobility               General bed mobility comments: encouraged sitting edge of bed but pt was hesitant and did not initiate task.  Transfers                    Ambulation/Gait                 Stairs             Wheelchair Mobility    Modified Rankin (Stroke Patients Only)       Balance                                            Cognition Arousal/Alertness: Awake/alert   Overall Cognitive Status: Within Functional Limits for tasks assessed                                  General Comments: initially difficult to understand speach but imprived with time      Exercises General Exercises - Lower Extremity Ankle Circles/Pumps: Both;10 reps Heel Slides: Both;10 reps Hip ABduction/ADduction: Both;10 reps Straight Leg Raises: Both;10 reps    General Comments        Pertinent Vitals/Pain Pain Assessment: Faces Faces Pain Scale: Hurts even more Pain Location: L hand/ arm and up into neck/head Pain Descriptors / Indicators: Radiating;Sore Pain Intervention(s): Limited activity within patient's tolerance;Monitored during session    Home Living                      Prior Function            PT Goals (current goals can now be found in the care plan section) Progress towards PT goals: PT to reassess next treatment  Frequency           PT Plan Current plan remains appropriate    Co-evaluation              AM-PAC PT "6 Clicks" Mobility   Outcome Measure  Help needed turning from your back to your side while in a flat bed without using bedrails?: A Lot Help needed moving from lying on your back to sitting on the side of a flat bed without using bedrails?: A Lot Help needed moving to and from a bed to a chair (including a wheelchair)?: A Lot Help needed standing up from a chair using your arms (e.g., wheelchair or bedside chair)?: A Lot Help needed to walk in hospital room?: A Lot Help needed climbing 3-5 steps with a railing? : A Lot 6 Click Score: 12    End of Session   Activity Tolerance: Other (comment) Patient left: with call bell/phone within reach;in bed;with bed alarm set         Time: [id number] PT Time Calculation (min) (ACUTE ONLY): 14 min  Charges:  $Therapeutic Exercise: 8-22 mins                     Chesley Noon, PTA 08/12/19, 9:41 AM

## 2019-08-12 NOTE — Progress Notes (Signed)
[facility] Progress Note  Patient: Calvin [name] [id number]   PCP: Lynnell Jude, MD DOB: [birth year]   DOA: 08/03/2019   DOS: 08/12/2019   Date of Service: the patient was seen and examined on 08/12/2019  Chief Complaint  Patient presents with  . Abdominal Pain   Brief hospital course: Pt. with PMH of bloody syndrome, alcohol abuse, anxiety, seizure disorder, CVA with residual left-sided weakness, COPD, depression, HTN, CAD; presented with complain of nausea and vomiting as well as abdominal pain and distention, was found to have recurrent ileus. GI was consulted as well as surgery was consulted.  Patient was treated conservatively with NG tube.  Patient tolerated removal of the NG tube.  Due to complaint of left-sided weakness a code stroke was called on 08/08/2019. MRI brain was negative for any acute stroke.  Currently further plan is continue conservative measures for ileus and monitor for improvement with medical management.  Subjective: Continues to have jerking movement of left upper and lower extremity as well as left facial muscles.  No nausea no vomiting.  Pain well controlled of the upper extremity.  No abdominal pain.  No bowel movement.  Assessment and Plan: Scheduled Meds: . ALPRAZolam  0.5 mg Oral TID  . amLODipine  5 mg Oral Daily  . aspirin  325 mg Oral Daily  . chlorhexidine  15 mL Mouth Rinse BID  . feeding supplement (ENSURE ENLIVE)  237 mL Oral TID BM  . gabapentin  800 mg Oral TID  . heparin  5,000 Units Subcutaneous Q8H  . lactulose  20 g Oral Daily  . levETIRAcetam  500 mg Oral BID  . lisinopril  7.5 mg Oral Daily  . mouth rinse  15 mL Mouth Rinse q12n4p  . metoprolol succinate  25 mg Oral Daily  . [START ON 08/13/2019] multivitamin with minerals  1 tablet Oral Daily  . polyethylene glycol  17 g Oral Daily  . simethicone  80 mg Oral QID  . tiotropium  18 mcg Inhalation Daily   Continuous Infusions: PRN Meds: acetaminophen, diphenhydrAMINE,  HYDROcodone-acetaminophen, ondansetron (ZOFRAN) IV, phenol, trolamine salicylate  1. Ileus No evidence of small bowel obstructions on the multiple x-ray. GI abdomen surgery were consulted. NG tube was inserted for decompression with significant output. NG tube was removed on 08/05/2019. Patient tolerated clear liquid diet for now. GI initially recommended colonoscopy as well as endoscopy but due to inability to reach out to family for consent and patient's perceived lack of ability to provide consent as well as with the concern for acute stroke and other comorbidities currently recommend medical management and conservative measures with outpatient follow-up for colonoscopy and endoscopy. EKG on 08/09/2019 shows QT of 440. Patient was provided scheduled Reglan.  Currently DC'd due to concern for tardive dyskinesia. Continue regular diet.  2. Acute metabolic encephalopathy. From polypharmacy. Patient was significantly drowsy and lethargic on 12/2 and 08/07/2019.   Suspect this is in the setting of polypharmacy with the use of IV Keppra as well as gabapentin as well as Xanax. We reduce the dose of the gabapentin and change the Xanax to as needed. We also switch IV Keppra to oral Keppra. Currently significant improvement in mentation.   Encephalopathy appears to have resolved.  3. Weakness. TIA versus acute CVA. Code stroke was called on 08/08/2019. Neurology consulted, appreciate assistance. MRI brain negative for any acute stroke. No further work-up or treatment change recommended by neurology. PT OT consult recommends going back to ALF with home health.  Speech therapy consult recommended no concern for swallowing or speech. Continue telemetry monitoring.  4.  Acute strep agalactiae UTI.   Treated with IV antibiotics. Currently no evidence of acute infection. Completed course.  5.  Hypokalemia. Mild hyponatremia. Replaced. Currently potassium level stable. Sodium level also stable  treated with IV fluids.  6.  Relative B12 deficiency Patient was given subcu B12 injection we will continue with oral B12.  7.  Exposure to COVID-19 No active covid.  Patient is from nursing home where there is an outbreak for COVID-19. Repeat coronavirus test performed which was negative. Patient does not require any isolation for now. Very low clinical suspicion for patient suffering from COVID-19 and is still asymptomatic.  8.  Peripheral vascular disease with carotid artery stenosis Bilateral moderate carotid artery stenosis. We will provide vascular referral outpatient.  9.  Prolonged QT interval on admission/currently resolved QT interval is prolonged on the time of admission. Multiple repeat EKG shows QT is currently normal. Monitor.  10.  History of seizure disorder Does not appear to be suffering from any acute seizures. Patient is on gabapentin 800 mg 3 times daily. Resuming the home dose. Patient was also on Keppra 500 mg twice daily which we will continue.  11.  Anxiety.  Mood disorder. Continuing Xanax. Dose changed from scheduled to as needed. Continue Zoloft.  12.  COPD. Continue Spiriva.  13.  Essential hypertension. Resume home blood pressure medication lisinopril as well as Toprol-XL.  14.  Headache. No focal deficit. ?  Sleep apnea. Add Fioricet.  15.  Possible tardive dyskinesia. In the setting of recent Reglan use. Discontinue Reglan. Starting the patient on Benadryl as needed. Also resuming patient's home Xanax. Appreciate neurological input.  Does not think the patient has tardive dyskinesia.  Recommend to hold Reglan and continue treating anxiety.  Diet: Cardiac diet DVT Prophylaxis: Subcutaneous Lovenox   Advance goals of care discussion: Full code  Family Communication: Unable to reach family despite multiple attempts.  Multiple provider as well as ancillary services has attempted to reach the family number provided in the chart.   Patient does not have any further information regarding how to reach out to the family.  Disposition:  Discharge to SNF likely tomorrow.  Consultants: Neurology, General surgery and GI currently signed off Procedures: Echocardiogram, NG tube insertion  Antibiotics: Anti-infectives (From admission, onward)   Start     Dose/Rate Route Frequency Ordered Stop   08/05/19 1300  ampicillin (OMNIPEN) 1 g in sodium chloride 0.9 % 100 mL IVPB  Status:  Discontinued     1 g 300 mL/hr over 20 Minutes Intravenous Every 6 hours 08/05/19 1217 08/07/19 1326   08/04/19 1500  cefTRIAXone (ROCEPHIN) 1 g in sodium chloride 0.9 % 100 mL IVPB  Status:  Discontinued     1 g 200 mL/hr over 30 Minutes Intravenous Every 24 hours 08/03/19 1719 08/05/19 1217   08/03/19 1530  cefTRIAXone (ROCEPHIN) 1 g in sodium chloride 0.9 % 100 mL IVPB     1 g 200 mL/hr over 30 Minutes Intravenous  Once 08/03/19 1527 08/03/19 1858       Objective: Physical Exam: Vitals:   08/12/19 0345 08/12/19 1119 08/12/19 1122 08/12/19 1457  BP: 123/90 119/75  112/72  Pulse: 73  77 81  Resp: 18   16  Temp: 98 F (36.7 C)   98 F (36.7 C)  TempSrc: Oral   Oral  SpO2: 97%   100%  Weight:  Height:        Intake/Output Summary (Last 24 hours) at 08/12/2019 1827 Last data filed at 08/12/2019 1411 Gross per 24 hour  Intake 240 ml  Output 1300 ml  Net -1060 ml   Filed Weights   08/03/19 1003 08/03/19 2218  Weight: 98.9 kg 96.4 kg   General: alert and oriented to time, place, and person. Appear in mild distress, affect flat in affect Eyes: PERRL, Conjunctiva normal ENT: Oral Mucosa Clear, moist  Neck: no JVD, no Abnormal Mass Or lumps Cardiovascular: S1 and S2 Present, no Murmur, peripheral pulses symmetrical Respiratory: good respiratory effort, Bilateral Air entry equal and Decreased, no signs of accessory muscle use, Clear to Auscultation, no Crackles, no wheezes Abdomen: Bowel Sound present, Soft and no tenderness,  distended, no hernia Skin: no rashes  Extremities: no Pedal edema, no calf tenderness Neurologic: Left-sided weakness with left-sided vision loss which is all chronic Gait not checked due to patient safety concerns   Data Reviewed: I have personally reviewed and interpreted daily labs, tele strips, imagings as discussed above. I reviewed all nursing notes, pharmacy notes, vitals, pertinent old records I have discussed plan of care as described above with RN and patient/family.  CBC: Recent Labs  Lab 08/07/19 0647 08/08/19 0830 08/12/19 0410  WBC 3.5* 4.1 4.3  NEUTROABS 2.3 2.4 2.5  HGB 12.5* 12.6* 12.6*  HCT 38.1* 38.0* 37.7*  MCV 90.9 89.2 88.7  PLT 163 172 [id number]   Basic Metabolic Panel: Recent Labs  Lab 08/06/19 0436 08/07/19 0647 08/08/19 0830 08/10/19 1057 08/12/19 0410  NA 145 142 141 141 140  K 4.1 4.1 4.0 3.7 3.9  CL 113* 111 108 111 109  CO2 25 23 22  20* 21*  GLUCOSE 104* 100* 83 80 81  BUN 8 8 8 8 9   CREATININE 1.07 1.02 1.11 0.92 1.02  CALCIUM 8.6* 8.4* 8.8* 8.6* 8.8*  MG 2.1 2.0 1.9 2.0 1.9  PHOS 2.3*  --   --   --   --     Liver Function Tests: Recent Labs  Lab 08/07/19 0647 08/08/19 0830 08/12/19 0410  AST 13* 16 31  ALT 9 13 30   ALKPHOS 48 51 55  BILITOT 0.8 0.7 0.5  PROT 6.8 7.0 6.9  ALBUMIN 3.3* 3.5 3.6   No results for input(s): LIPASE, AMYLASE in the last 168 hours. Recent Labs  Lab 08/07/19 0647  AMMONIA 27   Coagulation Profile: No results for input(s): INR, PROTIME in the last 168 hours. Cardiac Enzymes: No results for input(s): CKTOTAL, CKMB, CKMBINDEX, TROPONINI in the last 168 hours. BNP (last 3 results) No results for input(s): PROBNP in the last 8760 hours. CBG: Recent Labs  Lab 08/06/19 1337 08/08/19 1010  GLUCAP 72 82   Studies: No results found.   Time spent: 35 minutes  Author: Berle Mull, MD [facility] 08/12/2019 6:27 PM  To reach On-call, see care teams to locate the attending and reach out to  them via [email or web address]. If 7PM-7AM, please contact night-coverage If you still have difficulty reaching the attending provider, please page the [facility] (Director on Call) for [facility] on amion for assistance.

## 2019-08-12 NOTE — Progress Notes (Signed)
Subjective: Periods of tremors/shakes more on L side of body/shoulder and L face.    Objective: Current vital signs: BP 119/75   Pulse 77   Temp 98 F (36.7 C) (Oral)   Resp 18   Ht 5\' 11"  (1.803 m)   Wt 96.4 kg   SpO2 97%   BMI 29.64 kg/m  Vital signs in last 24 hours: Temp:  [98 F (36.7 C)-98.8 F (37.1 C)] 98 F (36.7 C) (12/08 0345) Pulse Rate:  [71-77] 77 (12/08 1122) Resp:  [17-20] 18 (12/08 0345) BP: (119-170)/(75-102) 119/75 (12/08 1119) SpO2:  [96 %-98 %] 97 % (12/08 0345)  Intake/Output from previous day: 12/07 0701 - 12/08 0700 In: 0  Out: 600 [Urine:600] Intake/Output this shift: Total I/O In: 240 [P.O.:240] Out: -  Nutritional status:  Diet Order            DIET DYS 3 Room service appropriate? Yes with Assist; Fluid consistency: Thin  Diet effective now              Neurologic Exam: Mental Status: Alert, oriented, thought content appropriate.  Speech fluent without evidence of aphasia.  Able to follow 3 step commands without difficulty. Cranial Nerves: II: LHH, pupils equal, round, reactive to light and accommodation III,IV, VI: ptosis not present, extra-ocular motions intact bilaterally V,VII: face symmetric, facial light touch sensation decreased on the left VIII: hearing normal bilaterally IX,X: gag reflex present XI: bilateral shoulder shrug XII: midline tongue extension Motor: Generalized weakness with no focal abnormalities noted and no evidence of drift Sensory: Pinprick and light touch decreased on the left upper and lower extremities   Lab Results: Basic Metabolic Panel: Recent Labs  Lab 08/06/19 0436 08/07/19 0647 08/08/19 0830 08/10/19 1057 08/12/19 0410  NA 145 142 141 141 140  K 4.1 4.1 4.0 3.7 3.9  CL 113* 111 108 111 109  CO2 25 23 22  20* 21*  GLUCOSE 104* 100* 83 80 81  BUN 8 8 8 8 9   CREATININE 1.07 1.02 1.11 0.92 1.02  CALCIUM 8.6* 8.4* 8.8* 8.6* 8.8*  MG 2.1 2.0 1.9 2.0 1.9  PHOS 2.3*  --   --   --   --      Liver Function Tests: Recent Labs  Lab 08/07/19 0647 08/08/19 0830 08/12/19 0410  AST 13* 16 31  ALT 9 13 30   ALKPHOS 48 51 55  BILITOT 0.8 0.7 0.5  PROT 6.8 7.0 6.9  ALBUMIN 3.3* 3.5 3.6   No results for input(s): LIPASE, AMYLASE in the last 168 hours. Recent Labs  Lab 08/07/19 0647  AMMONIA 27    CBC: Recent Labs  Lab 08/07/19 0647 08/08/19 0830 08/12/19 0410  WBC 3.5* 4.1 4.3  NEUTROABS 2.3 2.4 2.5  HGB 12.5* 12.6* 12.6*  HCT 38.1* 38.0* 37.7*  MCV 90.9 89.2 88.7  PLT 163 172 229    Cardiac Enzymes: No results for input(s): CKTOTAL, CKMB, CKMBINDEX, TROPONINI in the last 168 hours.  Lipid Panel: Recent Labs  Lab 08/09/19 0731  CHOL 110  TRIG 115  HDL 36*  CHOLHDL 3.1  VLDL 23  LDLCALC 51    CBG: Recent Labs  Lab 08/06/19 1337 08/08/19 1010  GLUCAP 72 82    Microbiology: Results for orders placed or performed during the hospital encounter of 08/03/19  SARS CORONAVIRUS 2 (TAT 6-24 HRS) Nasopharyngeal Nasopharyngeal Swab     Status: None   Collection Time: 08/03/19  3:29 PM   Specimen: Nasopharyngeal Swab  Result Value  Ref Range Status   SARS Coronavirus 2 NEGATIVE NEGATIVE Final    Comment: (NOTE) SARS-CoV-2 target nucleic acids are NOT DETECTED. The SARS-CoV-2 RNA is generally detectable in upper and lower respiratory specimens during the acute phase of infection. Negative results do not preclude SARS-CoV-2 infection, do not rule out co-infections with other pathogens, and should not be used as the sole basis for treatment or other patient management decisions. Negative results must be combined with clinical observations, patient history, and epidemiological information. The expected result is Negative. Fact Sheet for Patients: SugarRoll.be Fact Sheet for Healthcare Providers: https://www.woods-mathews.com/ This test is not yet approved or cleared by the [place] FDA and  has been  authorized for detection and/or diagnosis of SARS-CoV-2 by FDA under an Emergency Use Authorization (EUA). This EUA will remain  in effect (meaning this test can be used) for the duration of the COVID-19 declaration under Section 56 4(b)(1) of the Act, 21 U.S.C. section 360bbb-3(b)(1), unless the authorization is terminated or revoked sooner. Performed at [facility], [place] [address], [place], [place] [address]   Urine culture     Status: Abnormal   Collection Time: 08/03/19  4:09 PM   Specimen: Urine, Clean Catch  Result Value Ref Range Status   Specimen Description   Final    URINE, CLEAN CATCH Performed at [facility], [address], [place], [place] [address]    Special Requests   Final    NONE Performed at [facility], [address] [place], [place] [address]    Culture (A)  Final    >=100,000 COLONIES/mL GROUP B STREP(S.AGALACTIAE)ISOLATED TESTING AGAINST S. AGALACTIAE NOT ROUTINELY PERFORMED DUE TO PREDICTABILITY OF AMP/PEN/VAN SUSCEPTIBILITY. Performed at [facility], [place] [address] [place], [place] [address]    Report Status 08/04/2019 FINAL  Final  MRSA PCR Screening     Status: None   Collection Time: 08/04/19  6:35 AM   Specimen: Nasal Mucosa; Nasopharyngeal  Result Value Ref Range Status   MRSA by PCR NEGATIVE NEGATIVE Final    Comment:        The GeneXpert MRSA Assay (FDA approved for NASAL specimens only), is one component of a comprehensive MRSA colonization surveillance program. It is not intended to diagnose MRSA infection nor to guide or monitor treatment for MRSA infections. Performed at [facility], [address] [place], [place] [address]   SARS Coronavirus 2 by RT PCR (hospital order, performed in [facility] hospital lab) Nasopharyngeal Nasopharyngeal Swab     Status: None   Collection Time: 08/08/19  1:21 PM   Specimen: Nasopharyngeal Swab  Result Value Ref Range Status   SARS Coronavirus 2  NEGATIVE NEGATIVE Final    Comment: (NOTE) SARS-CoV-2 target nucleic acids are NOT DETECTED. The SARS-CoV-2 RNA is generally detectable in upper and lower respiratory specimens during the acute phase of infection. The lowest concentration of SARS-CoV-2 viral copies this assay can detect is 250 copies / mL. A negative result does not preclude SARS-CoV-2 infection and should not be used as the sole basis for treatment or other patient management decisions.  A negative result may occur with improper specimen collection / handling, submission of specimen other than nasopharyngeal swab, presence of viral mutation(s) within the areas targeted by this assay, and inadequate number of viral copies (<250 copies / mL). A negative result must be combined with clinical observations, patient history, and epidemiological information. Fact Sheet for Patients:   StrictlyIdeas.no Fact Sheet for  Healthcare Providers: BankingDealers.co.za This test is not yet approved or cleared  by the [place] and has been authorized for detection and/or diagnosis of SARS-CoV-2 by FDA under an Emergency Use Authorization (EUA).  This EUA will remain in effect (meaning this test can be used) for the duration of the COVID-19 declaration under Section 564(b)(1) of the Act, 21 U.S.C. section 360bbb-3(b)(1), unless the authorization is terminated or revoked sooner. Performed at [facility], [address] [place], [place] [address]     Coagulation Studies: No results for input(s): LABPROT, INR in the last 72 hours.  Imaging: No results found.  Medications:  I have reviewed the patient's current medications. Scheduled: . ALPRAZolam  0.5 mg Oral TID  . amLODipine  5 mg Oral Daily  . aspirin  325 mg Oral Daily  . chlorhexidine  15 mL Mouth Rinse BID  . diphenhydrAMINE  50 mg Intravenous QID  . feeding supplement (ENSURE ENLIVE)  237 mL Oral TID BM   . gabapentin  800 mg Oral TID  . heparin  5,000 Units Subcutaneous Q8H  . lactulose  20 g Oral Daily  . levETIRAcetam  500 mg Oral BID  . lisinopril  7.5 mg Oral Daily  . mouth rinse  15 mL Mouth Rinse q12n4p  . metoprolol succinate  25 mg Oral Daily  . [START ON 08/13/2019] multivitamin with minerals  1 tablet Oral Daily  . polyethylene glycol  17 g Oral Daily  . simethicone  80 mg Oral QID  . tiotropium  18 mcg Inhalation Daily    Assessment/Plan: Pt started to have L sided twitching.  He was on Reglan prior.  In reviewing he does not have increased tone, symptoms are distractible when he is peaking fluently.   I don't think this is tardive dyskinesia as normal tone.  - Agree to hold Reglan for now - Possibly anxiety component as well.

## 2019-08-12 NOTE — Care Management Important Message (Signed)
Important Message  Patient Details  Name: Calvin [name] MRN: [id number] Date of Birth: [birth year]   Medicare Important Message Given:  Yes     Calvin [name] 08/12/2019, 11:40 AM

## 2019-08-12 NOTE — Progress Notes (Signed)
Occupational Therapy Treatment Patient Details Name: Calvin [name] MRN: [id number] DOB: [birth year] Today's Date: 08/12/2019    History of present illness Pt. is a 68 y.o. male who was admitted to [facility] on 11/29 with nausea, vomiting, abdominal pain and abdominal distention. CT scan indicated Ileus, an NGT was placed. PMH: Olgivie syndrome, alcohol abuse, anxiety, seizure disorder, COPD, depression, hypertension, CAD, stroke x2 c Left hemiplegia.   OT comments  Pt seen for UE exercises and stretching to LUE and trigger point releases to help with decreasing pain.  Pt having facial twitching and tremors as well which makes it difficult to understand him but when tremors decreased 25% after receiving IV Benadryl from NSG he stated that it was really important for him to get seizure meds at 5am and 9:30pm which was discussed with NSG. Unable to perform any hand to mouth activities safely due to tremors this session.  Discussed status with NSG and Dr Posey Pronto who is hoping this is not tardive dyskinesia and rec increasing dose of Benadryl to NSG.    Follow Up Recommendations       Equipment Recommendations  3 in 1 bedside commode    Recommendations for Other Services      Precautions / Restrictions Precautions Precautions: Fall Precaution Comments: Pt having constand tremors in face, UEs and LEs and received Benadryl via IV during session 08/12/19 from NSG with minimal help in first 15 minutes. Restrictions Weight Bearing Restrictions: No       Mobility Bed Mobility               General bed mobility comments: encouraged sitting edge of bed but pt was hesitant and did not initiate task.  Transfers                      Balance                                           ADL either performed or assessed with clinical judgement   ADL Overall ADL's : Needs assistance/impaired                                       General ADL Comments: Pt  seen for UE exercises and stretching to LUE and trigger point releases to help with decreasing pain.  Pt having facial twitching and tremors as well which makes it difficult to understand him but when tremors decreased 25% after receiving IV Benadryl from NSG he stated that it was really important for him to get seizure meds at 5am and 9:30pm which was discussed with NSG. Unable to perform any hand to mouth activities safely due to tremors this session.     Vision Patient Visual Report: No change from baseline     Perception     Praxis      Cognition Arousal/Alertness: Awake/alert Behavior During Therapy: WFL for tasks assessed/performed Overall Cognitive Status: Within Functional Limits for tasks assessed                                 General Comments: initially difficult to understand speach but improved with time        Exercises General Exercises - Lower Extremity Ankle Circles/Pumps:  Both;10 reps Heel Slides: Both;10 reps Hip ABduction/ADduction: Both;10 reps Straight Leg Raises: Both;10 reps   Shoulder Instructions       General Comments      Pertinent Vitals/ Pain       Pain Assessment: Faces Faces Pain Scale: Hurts even more Pain Location: L hand/ arm and up into neck/head Pain Descriptors / Indicators: Radiating;Sore Pain Intervention(s): Limited activity within patient's tolerance;Monitored during session;Repositioned  Home Living                                          Prior Functioning/Environment              Frequency  Min 2X/week        Progress Toward Goals  OT Goals(current goals can now be found in the care plan section)  Progress towards OT goals: Progressing toward goals  Acute Rehab OT Goals Patient Stated Goal: To return home OT Goal Formulation: With patient Potential to Achieve Goals: Good  Plan Discharge plan remains appropriate    Co-evaluation                 AM-PAC OT "6 Clicks"  Daily Activity     Outcome Measure   Help from another person eating meals?: A Lot Help from another person taking care of personal grooming?: A Lot Help from another person toileting, which includes using toliet, bedpan, or urinal?: A Little Help from another person bathing (including washing, rinsing, drying)?: A Lot Help from another person to put on and taking off regular upper body clothing?: A Lot Help from another person to put on and taking off regular lower body clothing?: A Lot 6 Click Score: 13    End of Session    OT Visit Diagnosis: Muscle weakness (generalized) (M62.81)   Activity Tolerance Other (comment)(tol well but very limited due to tremors constantly)   Patient Left in bed;with call bell/phone within reach;with bed alarm set;Other (comment)(pt requested all bed rails be up while he has tremors and NSG updated)   Nurse Communication          Time: [id number] OT Time Calculation (min): 35 min  Charges: OT General Charges $OT Visit: 1 Visit OT Treatments $Therapeutic Activity: 23-37 mins  Chrys Racer, OTR/L, [place] ascom [phone] 08/12/19, 12:16 PM

## 2019-08-12 NOTE — Progress Notes (Signed)
Initial Nutrition Assessment  DOCUMENTATION CODES:   Not applicable  INTERVENTION:   Ensure Enlive po TID, each supplement provides 350 kcal and 20 grams of protein  Magic cup TID with meals, each supplement provides 290 kcal and 9 grams of protein  MVI daily   Dysphagia 3 diet   NUTRITION DIAGNOSIS:   Inadequate oral intake related to acute illness(ileus) as evidenced by meal completion < 50%.  GOAL:   Patient will meet greater than or equal to 90% of their needs  MONITOR:   PO intake, Supplement acceptance, Labs, Weight trends, Skin, I & O's  REASON FOR ASSESSMENT:   Consult Poor PO  ASSESSMENT:   68 y.o. male who was admitted to [facility] on 11/29 with nausea, vomiting, abdominal pain and abdominal distention. CT scan indicated Ileus, an NGT was placed. PMH: Olgivie syndrome, alcohol abuse, anxiety, seizure disorder, COPD, depression, hypertension, CAD, stroke x2 c Left hemiplegia.   RD working remotely.  Pt s/p removal of NGT tube on 12/1. Pt advanced to a regular diet 12/7. Pt documented to be eating <50% of meals in hospital. Pt ate 50% of his breakfast today which included eggs, grits, banana and 2% milk. RD will add supplements and MVI to help pt meet his estimated needs. Pt with left-sided weakness s/p CVA; pt requires assistance with meal preparation and requires his food to be cut up. RD will change pt to a dysphagia 3 diet. Per chart, pt appears fairly weight stable pta.   Medications reviewed and include: heparin, lactulose, MVI, miralax, simethicone  Labs reviewed:   Unable to complete Nutrition-Focused physical exam at this time.   Diet Order:   Diet Order            Diet regular Room service appropriate? Yes; Fluid consistency: Thin  Diet effective now             EDUCATION NEEDS:   Education needs have been addressed  Skin:  Skin Assessment: Reviewed RN Assessment  Last BM:  12/6- type 7  Height:   Ht Readings from Last 1 Encounters:   08/03/19 5\' 11"  (1.803 m)    Weight:   Wt Readings from Last 1 Encounters:  08/03/19 96.4 kg    Ideal Body Weight:  78 kg  BMI:  Body mass index is 29.64 kg/m.  Estimated Nutritional Needs:   Kcal:  2100-2400kcal/day  Protein:  105-120g/day  Fluid:  >2L/day   Koleen Distance MS, RD, LDN Pager #- [phone] Office#- [phone] After Hours Pager: [phone]

## 2019-08-13 DIAGNOSIS — F419 Anxiety disorder, unspecified: Secondary | ICD-10-CM

## 2019-08-13 DIAGNOSIS — R253 Fasciculation: Secondary | ICD-10-CM | POA: Diagnosis not present

## 2019-08-13 LAB — CBC WITH DIFFERENTIAL/PLATELET
Abs Immature Granulocytes: 0.04 10*3/uL (ref 0.00–0.07)
Basophils Absolute: 0 10*3/uL (ref 0.0–0.1)
Basophils Relative: 1 %
Eosinophils Absolute: 0 10*3/uL (ref 0.0–0.5)
Eosinophils Relative: 1 %
HCT: 37.5 % — ABNORMAL LOW (ref 39.0–52.0)
Hemoglobin: 12.1 g/dL — ABNORMAL LOW (ref 13.0–17.0)
Immature Granulocytes: 1 %
Lymphocytes Relative: 24 %
Lymphs Abs: 1 10*3/uL (ref 0.7–4.0)
MCH: 29.8 pg (ref 26.0–34.0)
MCHC: 32.3 g/dL (ref 30.0–36.0)
MCV: 92.4 fL (ref 80.0–100.0)
Monocytes Absolute: 0.6 10*3/uL (ref 0.1–1.0)
Monocytes Relative: 13 %
Neutro Abs: 2.6 10*3/uL (ref 1.7–7.7)
Neutrophils Relative %: 60 %
Platelets: 235 10*3/uL (ref 150–400)
RBC: 4.06 MIL/uL — ABNORMAL LOW (ref 4.22–5.81)
RDW: 12.7 % (ref 11.5–15.5)
WBC: 4.3 10*3/uL (ref 4.0–10.5)
nRBC: 0 % (ref 0.0–0.2)

## 2019-08-13 LAB — COMPREHENSIVE METABOLIC PANEL
ALT: 41 U/L (ref 0–44)
AST: 38 U/L (ref 15–41)
Albumin: 3.4 g/dL — ABNORMAL LOW (ref 3.5–5.0)
Alkaline Phosphatase: 59 U/L (ref 38–126)
Anion gap: 10 (ref 5–15)
BUN: 14 mg/dL (ref 8–23)
CO2: 24 mmol/L (ref 22–32)
Calcium: 8.9 mg/dL (ref 8.9–10.3)
Chloride: 109 mmol/L (ref 98–111)
Creatinine, Ser: 1.08 mg/dL (ref 0.61–1.24)
GFR calc Af Amer: >60 mL/min (ref >60)
GFR calc non Af Amer: >60 mL/min (ref >60)
Glucose, Bld: 112 mg/dL — ABNORMAL HIGH (ref 70–99)
Potassium: 3.9 mmol/L (ref 3.5–5.1)
Sodium: 143 mmol/L (ref 135–145)
Total Bilirubin: 0.3 mg/dL (ref 0.3–1.2)
Total Protein: 6.9 g/dL (ref 6.5–8.1)

## 2019-08-13 LAB — MAGNESIUM: Magnesium: 2.1 mg/dL (ref 1.7–2.4)

## 2019-08-13 LAB — SARS CORONAVIRUS 2 (TAT 6-24 HRS): SARS Coronavirus 2: NEGATIVE

## 2019-08-13 MED ORDER — DIAZEPAM 2 MG PO TABS: 2.0000 mg | ORAL_TABLET | Freq: Two times a day (BID) | ORAL | Status: DC

## 2019-08-13 MED ORDER — ALPRAZOLAM 1 MG PO TABS
1.0000 mg | ORAL_TABLET | Freq: Three times a day (TID) | ORAL | Status: DC
  Administered 2019-08-13 – 2019-08-14 (×4): 1 mg via ORAL
  Filled 2019-08-13 (×4): qty 1

## 2019-08-13 MED ORDER — ENOXAPARIN SODIUM 40 MG/0.4ML ~~LOC~~ SOLN
40.0000 mg | SUBCUTANEOUS | Status: DC
  Administered 2019-08-13: 22:00:00 40 mg via SUBCUTANEOUS
  Filled 2019-08-13: qty 0.4

## 2019-08-13 MED ORDER — LORAZEPAM 2 MG/ML IJ SOLN
1.0000 mg | Freq: Once | INTRAMUSCULAR | Status: AC
  Administered 2019-08-13: 1 mg via INTRAVENOUS
  Filled 2019-08-13: qty 1

## 2019-08-13 MED ORDER — KETOROLAC TROMETHAMINE 30 MG/ML IJ SOLN
15.0000 mg | Freq: Once | INTRAMUSCULAR | Status: AC
  Administered 2019-08-13: 15 mg via INTRAVENOUS
  Filled 2019-08-13: qty 1

## 2019-08-13 NOTE — Progress Notes (Addendum)
PROGRESS NOTE                                                                                                                                                                                                             Patient Demographics:    Calvin [name], is a 68 y.o. male, DOB - 13-Sep-1950, [id number]  Admit date - 08/03/2019   Admitting Physician Jennye Boroughs, MD  Outpatient Primary MD for the patient is Clemmie Krill, Lynnell Jude, MD  LOS - 10    Chief Complaint  Patient presents with  . Abdominal Pain       Brief Narrative  68 year old male with history of Ogilvie syndrome, alcohol abuse, anxiety, seizure disorder, COPD, depression, history of stroke, CAD, hypertension who presented with nausea, vomiting, abdominal pain and distention and found to have ileus.  Patient treated conservatively with NG tube placement and this has resolved.  Hospital course prolonged due to left-sided weakness with negative work-up.  Patient now having left upper and lower extremity and left facial twitching.   Subjective:   Patient continues to have twitching of his left upper extremity (not evident on his face and left lower extremity).  Also complains of pain and anxiety.   Assessment  & Plan :   Principal problem Left-sided twitching Suspect due to recent Reglan use which has been discontinued.  Neurology consult appreciated and unlikely tardive dyskinesia.  Added Benadryl as needed.  Resume his home dose Xanax and I have increased the dose to 1 mg 3 times daily (was 0.5 mg 3 times daily).  Continue gabapentin. EKG with normal QTC.  Active symptoms Ileus Manage conservatively and resolved.  Received NG initially for decompression and removed.  GI plan on colonoscopy and endoscopy as outpatient.  Acute metabolic encephalopathy Suspect polypharmacy.  (Gabapentin, Keppra and Xanax).  Currently resolved.  All meds have been  resumed.  Left-sided weakness MRI brain negative for acute stroke.  2D echo and carotids without significant disease.  No residual weakness.  PT recommends SNF.  B12 deficiency Oral supplement  Strep agalectae UTI Treated with antibiotic  COPD Stable.  Continue Spiriva  Essential hypertension Continue home meds  Anxiety/mood disorder Continue Xanax and Zoloft Xanax was increased due to ongoing anxiety   History of seizures Continue Keppra.  Carotid artery disease with moderate stenosis/peripheral vascular disease Needs outpatient  vascular referral   Code Status : Full code  Family Communication  : daughter updated on the phone  Disposition Plan  : Discharge to SNF possibly tomorrow if twitching improved  Barriers For Discharge : Active symptoms  Consults  : Neurology, surgery, GI  Procedures  : MRI brain, CT abdomen, 2D echo, carotid ultrasound  DVT Prophylaxis  :  Lovenox -   Lab Results  Component Value Date   PLT 235 08/13/2019    Antibiotics  :    Anti-infectives (From admission, onward)   Start     Dose/Rate Route Frequency Ordered Stop   08/05/19 1300  ampicillin (OMNIPEN) 1 g in sodium chloride 0.9 % 100 mL IVPB  Status:  Discontinued     1 g 300 mL/hr over 20 Minutes Intravenous Every 6 hours 08/05/19 1217 08/07/19 1326   08/04/19 1500  cefTRIAXone (ROCEPHIN) 1 g in sodium chloride 0.9 % 100 mL IVPB  Status:  Discontinued     1 g 200 mL/hr over 30 Minutes Intravenous Every 24 hours 08/03/19 1719 08/05/19 1217   08/03/19 1530  cefTRIAXone (ROCEPHIN) 1 g in sodium chloride 0.9 % 100 mL IVPB     1 g 200 mL/hr over 30 Minutes Intravenous  Once 08/03/19 1527 08/03/19 1858        Objective:   Vitals:   08/12/19 1457 08/12/19 1912 08/13/19 0359 08/13/19 1442  BP: 112/72 114/69 119/69 (!) 108/48  Pulse: 81 75 67 65  Resp: 16   14  Temp: 98 F (36.7 C) 98.5 F (36.9 C) (!) 97.4 F (36.3 C) 98.6 F (37 C)  TempSrc: Oral Oral Oral Oral  SpO2:  100% 97% 96% 99%  Weight:      Height:        Wt Readings from Last 3 Encounters:  08/03/19 96.4 kg  04/03/19 96.6 kg  02/17/18 97.5 kg     Intake/Output Summary (Last 24 hours) at 08/13/2019 1516 Last data filed at 08/13/2019 0849 Gross per 24 hour  Intake 240 ml  Output 600 ml  Net -360 ml     Physical Exam  Gen: not in distress, anxious HEENT:  moist mucosa, supple neck Chest: clear b/l, no added sounds CVS: N S1&S2, no murmurs, GI: soft, NT, ND,  Musculoskeletal: warm, no edema CNS: Alert and oriented, jerky movements of left arm, no weakness    Data Review:    CBC Recent Labs  Lab 08/07/19 0647 08/08/19 0830 08/12/19 0410 08/13/19 0400  WBC 3.5* 4.1 4.3 4.3  HGB 12.5* 12.6* 12.6* 12.1*  HCT 38.1* 38.0* 37.7* 37.5*  PLT 163 172 229 235  MCV 90.9 89.2 88.7 92.4  MCH 29.8 29.6 29.6 29.8  MCHC 32.8 33.2 33.4 32.3  RDW 12.4 12.4 12.3 12.7  LYMPHSABS 0.8 1.2 1.2 1.0  MONOABS 0.4 0.4 0.5 0.6  EOSABS 0.1 0.1 0.0 0.0  BASOSABS 0.0 0.0 0.0 0.0    Chemistries  Recent Labs  Lab 08/07/19 0647 08/08/19 0830 08/10/19 1057 08/12/19 0410 08/13/19 0400  NA 142 141 141 140 143  K 4.1 4.0 3.7 3.9 3.9  CL 111 108 111 109 109  CO2 23 22 20* 21* 24  GLUCOSE 100* 83 80 81 112*  BUN 8 8 8 9 14   CREATININE 1.02 1.11 0.92 1.02 1.08  CALCIUM 8.4* 8.8* 8.6* 8.8* 8.9  MG 2.0 1.9 2.0 1.9 2.1  AST 13* 16  --  31 38  ALT 9 13  --  30 41  ALKPHOS 48 51  --  55 59  BILITOT 0.8 0.7  --  0.5 0.3   ------------------------------------------------------------------------------------------------------------------ No results for input(s): CHOL, HDL, LDLCALC, TRIG, CHOLHDL, LDLDIRECT in the last 72 hours.  Lab Results  Component Value Date   HGBA1C 5.7 (H) 08/09/2019   ------------------------------------------------------------------------------------------------------------------ No results for input(s): TSH, T4TOTAL, T3FREE, THYROIDAB in the last 72 hours.  Invalid  input(s): FREET3 ------------------------------------------------------------------------------------------------------------------ No results for input(s): VITAMINB12, FOLATE, FERRITIN, TIBC, IRON, RETICCTPCT in the last 72 hours.  Coagulation profile No results for input(s): INR, PROTIME in the last 168 hours.  No results for input(s): DDIMER in the last 72 hours.  Cardiac Enzymes No results for input(s): CKMB, TROPONINI, MYOGLOBIN in the last 168 hours.  Invalid input(s): CK ------------------------------------------------------------------------------------------------------------------ No results found for: BNP  Inpatient Medications  Scheduled Meds: . amLODipine  5 mg Oral Daily  . aspirin  325 mg Oral Daily  . chlorhexidine  15 mL Mouth Rinse BID  . diazepam  2 mg Oral Q12H  . enoxaparin (LOVENOX) injection  40 mg Subcutaneous Q24H  . feeding supplement (ENSURE ENLIVE)  237 mL Oral TID BM  . gabapentin  800 mg Oral TID  . lactulose  20 g Oral Daily  . levETIRAcetam  500 mg Oral BID  . lisinopril  7.5 mg Oral Daily  . mouth rinse  15 mL Mouth Rinse q12n4p  . metoprolol succinate  25 mg Oral Daily  . multivitamin with minerals  1 tablet Oral Daily  . polyethylene glycol  17 g Oral Daily  . simethicone  80 mg Oral QID  . tiotropium  18 mcg Inhalation Daily   Continuous Infusions: PRN Meds:.acetaminophen, diphenhydrAMINE, HYDROcodone-acetaminophen, ondansetron (ZOFRAN) IV, phenol, trolamine salicylate  Micro Results Recent Results (from the past 240 hour(s))  SARS CORONAVIRUS 2 (TAT 6-24 HRS) Nasopharyngeal Nasopharyngeal Swab     Status: None   Collection Time: 08/03/19  3:29 PM   Specimen: Nasopharyngeal Swab  Result Value Ref Range Status   SARS Coronavirus 2 NEGATIVE NEGATIVE Final    Comment: (NOTE) SARS-CoV-2 target nucleic acids are NOT DETECTED. The SARS-CoV-2 RNA is generally detectable in upper and lower respiratory specimens during the acute phase of  infection. Negative results do not preclude SARS-CoV-2 infection, do not rule out co-infections with other pathogens, and should not be used as the sole basis for treatment or other patient management decisions. Negative results must be combined with clinical observations, patient history, and epidemiological information. The expected result is Negative. Fact Sheet for Patients: SugarRoll.be Fact Sheet for Healthcare Providers: https://www.woods-mathews.com/ This test is not yet approved or cleared by the [place] FDA and  has been authorized for detection and/or diagnosis of SARS-CoV-2 by FDA under an Emergency Use Authorization (EUA). This EUA will remain  in effect (meaning this test can be used) for the duration of the COVID-19 declaration under Section 56 4(b)(1) of the Act, 21 U.S.C. section 360bbb-3(b)(1), unless the authorization is terminated or revoked sooner. Performed at [facility], [place] [address], [place], [place] [address]   Urine culture     Status: Abnormal   Collection Time: 08/03/19  4:09 PM   Specimen: Urine, Clean Catch  Result Value Ref Range Status   Specimen Description   Final    URINE, CLEAN CATCH Performed at [facility], [address], [place], [place] [address]    Special Requests   Final    NONE Performed at [facility], [place],  [place] [address]    Culture (A)  Final    >=100,000 COLONIES/mL GROUP B STREP(S.AGALACTIAE)ISOLATED TESTING AGAINST S. AGALACTIAE NOT ROUTINELY PERFORMED DUE TO PREDICTABILITY OF AMP/PEN/VAN SUSCEPTIBILITY. Performed at [facility], [place] [address], [place], [place] [address]    Report Status 08/04/2019 FINAL  Final  MRSA PCR Screening     Status: None   Collection Time: 08/04/19  6:35 AM   Specimen: Nasal Mucosa; Nasopharyngeal  Result Value Ref Range Status   MRSA by PCR NEGATIVE NEGATIVE Final    Comment:        The  GeneXpert MRSA Assay (FDA approved for NASAL specimens only), is one component of a comprehensive MRSA colonization surveillance program. It is not intended to diagnose MRSA infection nor to guide or monitor treatment for MRSA infections. Performed at [facility], [address] [place], [place] [address]   SARS Coronavirus 2 by RT PCR (hospital order, performed in [facility] hospital lab) Nasopharyngeal Nasopharyngeal Swab     Status: None   Collection Time: 08/08/19  1:21 PM   Specimen: Nasopharyngeal Swab  Result Value Ref Range Status   SARS Coronavirus 2 NEGATIVE NEGATIVE Final    Comment: (NOTE) SARS-CoV-2 target nucleic acids are NOT DETECTED. The SARS-CoV-2 RNA is generally detectable in upper and lower respiratory specimens during the acute phase of infection. The lowest concentration of SARS-CoV-2 viral copies this assay can detect is 250 copies / mL. A negative result does not preclude SARS-CoV-2 infection and should not be used as the sole basis for treatment or other patient management decisions.  A negative result may occur with improper specimen collection / handling, submission of specimen other than nasopharyngeal swab, presence of viral mutation(s) within the areas targeted by this assay, and inadequate number of viral copies (<250 copies / mL). A negative result must be combined with clinical observations, patient history, and epidemiological information. Fact Sheet for Patients:   StrictlyIdeas.no Fact Sheet for Healthcare Providers: BankingDealers.co.za This test is not yet approved or cleared  by the [place] FDA and has been authorized for detection and/or diagnosis of SARS-CoV-2 by FDA under an Emergency Use Authorization (EUA).  This EUA will remain in effect (meaning this test can be used) for the duration of the COVID-19 declaration under Section 564(b)(1) of the Act, 21 U.S.C. section  360bbb-3(b)(1), unless the authorization is terminated or revoked sooner. Performed at [facility], [place],  [address]   SARS CORONAVIRUS 2 (TAT 6-24 HRS) Nasopharyngeal Nasopharyngeal Swab     Status: None   Collection Time: 08/12/19 10:07 PM   Specimen: Nasopharyngeal Swab  Result Value Ref Range Status   SARS Coronavirus 2 NEGATIVE NEGATIVE Final    Comment: (NOTE) SARS-CoV-2 target nucleic acids are NOT DETECTED. The SARS-CoV-2 RNA is generally detectable in upper and lower respiratory specimens during the acute phase of infection. Negative results do not preclude SARS-CoV-2 infection, do not rule out co-infections with other pathogens, and should not be used as the sole basis for treatment or other patient management decisions. Negative results must be combined with clinical observations, patient history, and epidemiological information. The expected result is Negative. Fact Sheet for Patients: SugarRoll.be Fact Sheet for Healthcare Providers: https://www.woods-mathews.com/ This test is not yet approved or cleared by the [place] FDA and  has been authorized for detection and/or diagnosis of SARS-CoV-2 by FDA under an Emergency Use Authorization (EUA). This EUA will remain  in effect (meaning this test can be  used) for the duration of the COVID-19 declaration under Section 56 4(b)(1) of the Act, 21 U.S.C. section 360bbb-3(b)(1), unless the authorization is terminated or revoked sooner. Performed at [facility], [place] [address], [place], [place] [address]     Radiology Reports Dg Abdomen 1 View  Result Date: 08/03/2019 CLINICAL DATA:  Evaluate NG tube placement EXAM: ABDOMEN - 1 VIEW COMPARISON:  KUB April 08, 2019.  CT scan August 03, 2019. FINDINGS: The side port of the NG tube is probably at or just below the GE junction. The distal tip is in the region of the gastric body. Dilated  loops of bowel are again identified. IMPRESSION: The side port of the NG tube is probably at or just below the GE junction. The distal tip is in the region of the gastric body. Electronically Signed   By: Dorise Bullion III M.D   On: 08/03/2019 16:39   Mr Brain Wo Contrast  Result Date: 08/08/2019 CLINICAL DATA:  Focal neuro deficit, greater than 6 hours, stroke suspected. Additional history provided: RN called rapid response as patient reported left side of face was numb. EXAM: MRI HEAD WITHOUT CONTRAST TECHNIQUE: Multiplanar, multiecho pulse sequences of the brain and surrounding structures were obtained without intravenous contrast. COMPARISON:  Brain MRI 10/01/2017 FINDINGS: Brain: There is no evidence of acute infarct. No evidence of intracranial mass. No midline shift or extra-axial fluid collection. No chronic intracranial blood products. 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 There is a background of moderate chronic small vessel ischemic disease. Moderate generalized parenchymal atrophy. 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 Flow voids otherwise maintained within the proximal large arterial vessels. Skull and upper cervical spine: No focal marrow lesion. Incompletely assessed upper cervical spondylosis with multilevel spinal canal stenosis. Sinuses/Orbits: Visualized orbits demonstrate no acute abnormality. Minimal scattered paranasal sinus mucosal thickening. Right sphenoid sinus mucous retention cyst, unchanged. No significant mastoid effusion. IMPRESSION: 1. No evidence of acute intracranial abnormality, including acute infarct. 2. Redemonstrated presumably  posttraumatic encephalomalacia within the anteroinferior frontal lobes and anterior right temporal lobe. 3. 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 4. Background of moderate generalized parenchymal atrophy and chronic small vessel ischemic disease. 5. 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 6. Incompletely assessed upper cervical spondylosis with spinal canal stenosis. Electronically Signed   By: Kellie Simmering DO   On: 08/08/2019 21:32   Ct Abdomen Pelvis W Contrast  Result Date: 08/03/2019 CLINICAL DATA:  Abdominal pain, distention, nausea, vomiting and diarrhea. EXAM: CT ABDOMEN AND PELVIS WITH CONTRAST TECHNIQUE: Multidetector CT imaging of the abdomen and pelvis was performed using the standard protocol following bolus administration of intravenous contrast. CONTRAST:  171mL OMNIPAQUE IOHEXOL 300 MG/ML  SOLN COMPARISON:  04/02/2019 FINDINGS: Lower chest: Stable bibasilar scarring. Hepatobiliary: No focal liver abnormality is seen. No gallstones, gallbladder wall thickening, or biliary dilatation. Pancreas: Unremarkable. No pancreatic ductal dilatation or surrounding inflammatory changes. Spleen: Normal in size without focal abnormality. Adrenals/Urinary Tract: Adrenal glands demonstrates stable bilateral nodules. Stable cortical scarring of both kidneys. No renal calculi, focal lesion, or hydronephrosis. Bladder is unremarkable. Stomach/Bowel: The stomach is moderately dilated and contains an air-fluid level. There is some mild dilatation of  proximal jejunum. The rest of the small bowel is decompressed. Stable gaseous distention of predominantly the transverse colon without obstructing lesion identified. A trace amount of free fluid is seen adjacent to the proximal descending colon. No free air. Vascular/Lymphatic: No significant vascular findings are present. No enlarged abdominal or pelvic lymph nodes. Reproductive: Prostate is  unremarkable. Other: No hernias or focal abscess identified. Musculoskeletal: No acute or significant osseous findings. IMPRESSION: 1. Stable gaseous distention of predominantly the transverse colon without obstructing lesion identified by CT. A trace amount of free fluid is seen adjacent to the proximal descending colon. 2. Gastric distension and some distention of proximal small bowel. Findings are suggestive of ileus or enteritis. 3. Stable bilateral adrenal nodules. 4. Stable bilateral renal cortical scarring. Electronically Signed   By: Aletta Edouard M.D.   On: 08/03/2019 15:04   US Carotid Bilateral (at [facility] And [facility] Only)  Result Date: 08/09/2019 CLINICAL DATA:  TIA. Hypertension, coronary artery disease, previous tobacco abuse EXAM: BILATERAL CAROTID DUPLEX ULTRASOUND TECHNIQUE: Pearline Cables scale imaging, color Doppler and duplex ultrasound were performed of bilateral carotid and vertebral arteries in the neck. COMPARISON:  CTA neck 10/02/2017 FINDINGS: Criteria: Quantification of carotid stenosis is based on velocity parameters that correlate the residual internal carotid diameter with NASCET-based stenosis levels, using the diameter of the distal internal carotid lumen as the denominator for stenosis measurement. The following velocity measurements were obtained: RIGHT ICA: 148/34 cm/sec CCA: 123456 cm/sec SYSTOLIC ICA/CCA RATIO:  1.9 ECA: 58 cm/sec LEFT ICA: 175/38 cm/sec CCA: 0000000 cm/sec SYSTOLIC ICA/CCA RATIO:  3.4 ECA: 118 cm/sec RIGHT CAROTID ARTERY: Mild tortuosity. Eccentric noncalcified plaque in the distal common carotid artery and bulb without high-grade stenosis. Noncalcified plaque in the distal ICA with elevated peak systolic velocities. RIGHT VERTEBRAL ARTERY:  Normal flow direction and waveform. LEFT CAROTID ARTERY: Mild tortuosity. Partially calcified plaque in the bulb and proximal ICA. Noncalcified plaque extends through the mid and distal ICA resulting in at least mild stenosis, with  elevated peak systolic velocities, probably overestimated due to poor angle correction. LEFT VERTEBRAL ARTERY:  Normal flow direction and waveform. IMPRESSION: 1. 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 2.  Antegrade bilateral vertebral arterial flow. Electronically Signed   By: Lucrezia Europe M.D.   On: 08/09/2019 09:15   Dg Abd 2 Views  Result Date: 08/06/2019 CLINICAL DATA:  Abdominal distension. EXAM: ABDOMEN - 2 VIEW COMPARISON:  Plain films of the abdomen and CT abdomen and pelvis 08/03/2019. Plain films of the abdomen 04/27/2019. FINDINGS: Gaseous distention of the colon is unchanged. No free intraperitoneal air. No unexpected abdominal calcification. Atherosclerosis is noted. The patient is status post left hip replacement. IMPRESSION: Gaseous distention of the colon most compatible with ileus. Electronically Signed   By: Inge Rise M.D.   On: 08/06/2019 15:30   Dg Abd Portable 1v  Result Date: 08/10/2019 CLINICAL DATA:  Evaluate ileus. Status post NG tube removal. EXAM: PORTABLE ABDOMEN - 1 VIEW COMPARISON:  08/08/2019 FINDINGS: Persistent dilated air-filled colon with scattered nondilated small bowel loops. The appearance is unchanged when compared with 08/08/2019. IMPRESSION: 1. No change in colonic distension as noted on 08/08/2019. Findings are favored to represent a colonic ileus versus pseudo obstruction of the large bowel. Electronically Signed   By: Kerby Moors M.D.   On: 08/10/2019 15:42   Dg Abd Portable 1v  Result Date: 08/08/2019 CLINICAL DATA:  Abdominal distension. EXAM: PORTABLE ABDOMEN - 1 VIEW COMPARISON:  08/06/2019 FINDINGS: Persistent  mildly dilated air-filled colon along with scattered nondilated air-filled small bowel loops. Findings suggest a colonic ileus. No free air is identified. The lung bases are grossly clear. The bony structures are unremarkable. Left hip prosthesis noted with adjacent heterotopic ossification. Stable  vascular calcifications. IMPRESSION: Colonic ileus bowel gas pattern without findings for obstruction or perforation. Electronically Signed   By: Marijo Sanes M.D.   On: 08/08/2019 08:55    Time Spent in minutes  25   Rhegan Trunnell M.D on 08/13/2019 at 3:16 PM  Between 7am to 7pm - Pager - [phone]  After 7pm go to [email or web address] - password [facility]  [facility] -  Office  [phone]

## 2019-08-13 NOTE — TOC Progression Note (Signed)
Transition of Care [facility]) - Progression Note    Patient Details  Name: Calvin [name] MRN: [id number] Date of Birth: 1950/11/11  [facility] CM/SW Contact  Ross Ludwig, [place] Phone Number: 08/13/2019, 3:20 PM  Clinical Narrative:    CSW spoke to SNF and patient can return once he is medically ready for discharge.   Expected Discharge Plan: Long Term Nursing Home Barriers to Discharge: Continued Medical Work up  Expected Discharge Plan and Services Expected Discharge Plan: [place]     Post Acute Care Choice: [place] arrangements for the past 2 months: [place]                                       Social Determinants of Health (SDOH) Interventions    Readmission Risk Interventions Readmission Risk Prevention Plan 08/08/2019  Transportation Screening Complete  HRI or [place] Not Complete  HRI or Home Care Consult comments not indicated from LTC  Medication Review (RN Care Manager) Complete  Some recent data might be hidden

## 2019-08-13 NOTE — Progress Notes (Signed)
Pt gives verbal permission to update his daughter [name] on his status and plan

## 2019-08-13 NOTE — Progress Notes (Signed)
Physical Therapy Treatment Patient Details Name: Calvin [name] MRN: [id number] DOB: 19-Sep-1950 Today's Date: 08/13/2019    History of Present Illness Pt. is a 68 y.o. male who was admitted to [facility] on 11/29 with nausea, vomiting, abdominal pain and abdominal distention. CT scan indicated Ileus, an NGT was placed. PMH: Olgivie syndrome, alcohol abuse, anxiety, seizure disorder, COPD, depression, hypertension, CAD, stroke x2 c Left hemiplegia.    PT Comments    Patient in bed upon PT arrival. Patient initially agreeable to supine interventions then to sitting EOB. He required Min A for transition of supine to sit and was able to maintain seated position. To assess if goals/plan needed to be updated patient was asked/told to stand. Patient became agitated and immediately laid back down. Session terminated early due to agitation from patient, PT unable to assess current POC and need for change due to patient agitation. Will assess further next session pending patient compliance.    Follow Up Recommendations  Home health PT;Supervision/Assistance - 24 hour;Supervision for mobility/OOB(Return to ALF)     Equipment Recommendations  None recommended by PT(Use walker at discharge for ambulation)    Recommendations for Other Services       Precautions / Restrictions Precautions Precautions: Fall Precaution Comments: Pt having constand tremors in face, UEs and LEs Restrictions Weight Bearing Restrictions: No    Mobility  Bed Mobility Overal bed mobility: Modified Independent Bed Mobility: Supine to Sit;Sit to Supine     Supine to sit: Min assist Sit to supine: Min assist   General bed mobility comments: patient requires Min A for supine <>sit transfer for LLE and catheter movement.  Transfers                 General transfer comment: Patient declined standing, laid back down immediately upon request to stand  Ambulation/Gait                 Stairs              Wheelchair Mobility    Modified Rankin (Stroke Patients Only)       Balance Overall balance assessment: Needs assistance;History of Falls Sitting-balance support: No upper extremity supported Sitting balance-Leahy Scale: Fair Sitting balance - Comments: able to sit EOB without LOB                                    Cognition Arousal/Alertness: Awake/alert Behavior During Therapy: WFL for tasks assessed/performed;Agitated Overall Cognitive Status: Within Functional Limits for tasks assessed                                 General Comments: intermittent agitation throughout session      Exercises General Exercises - Lower Extremity Ankle Circles/Pumps: Both;10 reps Long Arc Quad: Both;10 reps Heel Slides: Both;10 reps Hip ABduction/ADduction: Both;10 reps Straight Leg Raises: Both;10 reps Hip Flexion/Marching: Both;10 reps    General Comments        Pertinent Vitals/Pain Pain Assessment: Faces Faces Pain Scale: Hurts even more Pain Location: L hand/ arm and up into neck/head Pain Descriptors / Indicators: Radiating;Sore Pain Intervention(s): Limited activity within patient's tolerance;Monitored during session;Repositioned    Home Living                      Prior Function  PT Goals (current goals can now be found in the care plan section) Progress towards PT goals: PT to reassess next treatment    Frequency    Min 2X/week      PT Plan Current plan remains appropriate(patient refused standing for further assessment)    Co-evaluation              AM-PAC PT "6 Clicks" Mobility   Outcome Measure  Help needed turning from your back to your side while in a flat bed without using bedrails?: None Help needed moving from lying on your back to sitting on the side of a flat bed without using bedrails?: A Little Help needed moving to and from a bed to a chair (including a wheelchair)?: A Lot Help  needed standing up from a chair using your arms (e.g., wheelchair or bedside chair)?: A Lot Help needed to walk in hospital room?: A Lot Help needed climbing 3-5 steps with a railing? : A Lot 6 Click Score: 15    End of Session   Activity Tolerance: No increased pain;Treatment limited secondary to agitation Patient left: with call bell/phone within reach;in bed;with bed alarm set Nurse Communication: Mobility status(condom catheter was off upon arrival, new sheets needed) PT Visit Diagnosis: Difficulty in walking, not elsewhere classified (R26.2);Muscle weakness (generalized) (M62.81);Other abnormalities of gait and mobility (R26.89)     Time: [id number] PT Time Calculation (min) (ACUTE ONLY): 14 min  Charges:  $Therapeutic Exercise: 8-22 mins                     Janna Arch, PT, DPT     Janna Arch 08/13/2019, 4:58 PM

## 2019-08-14 DIAGNOSIS — K567 Ileus, unspecified: Secondary | ICD-10-CM | POA: Diagnosis not present

## 2019-08-14 DIAGNOSIS — R9431 Abnormal electrocardiogram [ECG] [EKG]: Secondary | ICD-10-CM

## 2019-08-14 DIAGNOSIS — R253 Fasciculation: Secondary | ICD-10-CM | POA: Diagnosis not present

## 2019-08-14 DIAGNOSIS — N3000 Acute cystitis without hematuria: Secondary | ICD-10-CM

## 2019-08-14 MED ORDER — ENSURE ENLIVE PO LIQD: 237.0000 mL | Freq: Three times a day (TID) | ORAL | 12 refills | Status: DC

## 2019-08-14 MED ORDER — LISINOPRIL 5 MG PO TABS: 5.0000 mg | ORAL_TABLET | Freq: Every day | ORAL | 0 refills | Status: DC

## 2019-08-14 MED ORDER — ALPRAZOLAM 0.5 MG PO TABS: 1.0000 mg | ORAL_TABLET | Freq: Three times a day (TID) | ORAL | 0 refills | Status: DC

## 2019-08-14 MED ORDER — HYDROCODONE-ACETAMINOPHEN 5-325 MG PO TABS: 1.0000 | ORAL_TABLET | Freq: Four times a day (QID) | ORAL | 0 refills | Status: DC | PRN

## 2019-08-14 NOTE — Plan of Care (Signed)
  Problem: Education: Goal: Knowledge of General Education information will improve Description: Including pain rating scale, medication(s)/side effects and non-pharmacologic comfort measures Outcome: Adequate for Discharge   Problem: Health Behavior/Discharge Planning: Goal: Ability to manage health-related needs will improve Outcome: Adequate for Discharge   Problem: Clinical Measurements: Goal: Ability to maintain clinical measurements within normal limits will improve Outcome: Adequate for Discharge Goal: Will remain free from infection Outcome: Adequate for Discharge Goal: Diagnostic test results will improve Outcome: Adequate for Discharge Goal: Respiratory complications will improve Outcome: Adequate for Discharge Goal: Cardiovascular complication will be avoided Outcome: Adequate for Discharge   Problem: Activity: Goal: Risk for activity intolerance will decrease Outcome: Adequate for Discharge   Problem: Nutrition: Goal: Adequate nutrition will be maintained Outcome: Adequate for Discharge   Problem: Coping: Goal: Level of anxiety will decrease Outcome: Adequate for Discharge   Problem: Elimination: Goal: Will not experience complications related to bowel motility Outcome: Adequate for Discharge Goal: Will not experience complications related to urinary retention Outcome: Adequate for Discharge   Problem: Pain Managment: Goal: General experience of comfort will improve Outcome: Adequate for Discharge   Problem: Safety: Goal: Ability to remain free from injury will improve Outcome: Adequate for Discharge   Problem: Skin Integrity: Goal: Risk for impaired skin integrity will decrease Outcome: Adequate for Discharge   Problem: Education: Goal: Knowledge of disease or condition will improve Outcome: Adequate for Discharge Goal: Knowledge of secondary prevention will improve Outcome: Adequate for Discharge Goal: Knowledge of patient specific risk factors  addressed and post discharge goals established will improve Outcome: Adequate for Discharge Goal: Individualized Educational Video(s) Outcome: Adequate for Discharge   Problem: Coping: Goal: Will verbalize positive feelings about self Outcome: Adequate for Discharge Goal: Will identify appropriate support needs Outcome: Adequate for Discharge   Problem: Health Behavior/Discharge Planning: Goal: Ability to manage health-related needs will improve Outcome: Adequate for Discharge   Problem: Self-Care: Goal: Ability to participate in self-care as condition permits will improve Outcome: Adequate for Discharge Goal: Verbalization of feelings and concerns over difficulty with self-care will improve Outcome: Adequate for Discharge Goal: Ability to communicate needs accurately will improve Outcome: Adequate for Discharge   Problem: Nutrition: Goal: Risk of aspiration will decrease Outcome: Adequate for Discharge Goal: Dietary intake will improve Outcome: Adequate for Discharge   Problem: Intracerebral Hemorrhage Tissue Perfusion: Goal: Complications of Intracerebral Hemorrhage will be minimized Outcome: Adequate for Discharge   Problem: Ischemic Stroke/TIA Tissue Perfusion: Goal: Complications of ischemic stroke/TIA will be minimized Outcome: Adequate for Discharge   Problem: Spontaneous Subarachnoid Hemorrhage Tissue Perfusion: Goal: Complications of Spontaneous Subarachnoid Hemorrhage will be minimized Outcome: Adequate for Discharge   

## 2019-08-14 NOTE — Progress Notes (Signed)
Removed pt IV and pt left via EMS @ 2039. Pt report a throat spray that he thought he had but was not found in the room. No signs of distress noted at the time of discharge.

## 2019-08-14 NOTE — Discharge Summary (Addendum)
Physician Discharge Summary  Calvin [name] [id number] DOB: Sep 01, 1951 DOA: 08/03/2019  PCP: Lynnell Jude, MD  Admit date: 08/03/2019 Discharge date: 08/14/2019  Admitted From: Home Disposition: Skilled nursing facility  Recommendations for Outpatient Follow-up:  Follow up with MD at SNF in 1 week. Patient will be seen by GI as outpatient for EGD and colonoscopy. Patient needs outpatient vascular surgery referral for moderate carotid stenosis. Patient tested negative for COVID-19 on 12/9.   Equipment/Devices: None  Discharge Condition: Fair CODE STATUS: Full code Diet recommendation: Dysphagia level 3    Discharge Diagnoses:  Principal Problem: Muscle twitching  Active Problems: Ileus   Left-sided weakness   HTN (hypertension)   COPD (chronic obstructive pulmonary disease) (HCC)   Depression with anxiety   Acute UTI   Hypokalemia   QT prolongation  Brief narrative/HPI 68 year old male with history of Ogilvie syndrome, alcohol abuse, anxiety, seizure disorder, COPD, depression, history of stroke, CAD, hypertension who presented with nausea, vomiting, abdominal pain and distention and found to have ileus.  Patient treated conservatively with NG tube placement and this has resolved.  Hospital course prolonged due to left-sided weakness with negative work-up.  Patient now having left upper and lower extremity and left facial twitching.   Principal problem Left-sided twitching Suspect due to recent Reglan use which has been discontinued.  Neurology consult appreciated and feel this is unlikely due to tardive dyskinesia.    Received Benadryl as needed Resume his home dose Xanax and I have increased the dose to 1 mg 3 times daily (was 0.5 mg 3 times daily.needs to taper down to 0.5 mg if anxiety improves).  Continue gabapentin. EKG with normal QTC.  Patient's twitching today is much improved.  He is stable to be discharged to SNF.  Active symptoms Ileus Managed  conservatively and resolved.  Received NG initially for decompression and removed.  GI plan on colonoscopy and endoscopy as outpatient.  Patient on dysphagia level 3 diet  Acute metabolic encephalopathy Suspect polypharmacy.  (Gabapentin, Keppra and Xanax).  Currently resolved.  All meds have been resumed.  Left-sided weakness MRI brain negative for acute stroke.  2D echo and carotids without significant disease.  No residual weakness.  PT recommends SNF.  B12 deficiency Continue supplement.  Strep agalectae UTI Treated with antibiotic  COPD Stable.  Continue Spiriva  Essential hypertension Continue home meds.  Blood pressure soft this morning so I have reduced his lisinopril dose.  Anxiety/mood disorder Continue Xanax and Zoloft Xanax was increased due to ongoing anxiety (need to titrate down dose once better).   History of seizures Continue Keppra.  Carotid artery disease with moderate stenosis/peripheral vascular disease Needs outpatient vascular referral     Family Communication  : daughter updated on the phone  Disposition Plan  :  SNF  Consults  : Neurology, surgery, GI  Procedures  : MRI brain, CT abdomen, 2D echo, carotid ultrasound   Discharge Instructions   Allergies as of 08/14/2019   No Known Allergies     Medication List    TAKE these medications   acetaminophen 650 MG CR tablet Commonly known as: TYLENOL Take 650 mg by mouth every 6 (six) hours.   ALPRAZolam 0.5 MG tablet Commonly known as: XANAX Take 2 tablets (1 mg total) by mouth 3 (three) times daily. What changed: how much to take   aspirin 81 MG tablet Take 81 mg by mouth daily.   BENEFIBER DRINK MIX PO Take 5 g by mouth daily.   bisacodyl  10 MG suppository Commonly known as: DULCOLAX Place 10 mg rectally daily.   Cepacol Sore Throat & Cough 5-7.5 MG Lozg Generic drug: Dextromethorphan-Benzocaine Use as directed 1 lozenge in the mouth or throat every 2 (two)  hours as needed (sore throat).   ergocalciferol 1.25 MG (50000 UT) capsule Commonly known as: VITAMIN D2 Take 50,000 Units by mouth every 30 (thirty) days.   feeding supplement (ENSURE ENLIVE) Liqd Take 237 mLs by mouth 3 (three) times daily between meals.   furosemide 40 MG tablet Commonly known as: LASIX Take 60 mg by mouth daily.   gabapentin 400 MG capsule Commonly known as: NEURONTIN Take 2 capsules (800 mg total) by mouth 3 (three) times daily.   HYDROcodone-acetaminophen 5-325 MG tablet Commonly known as: NORCO/VICODIN Take 1 tablet by mouth every 6 (six) hours as needed for moderate pain or severe pain.   lactulose 10 GM/15ML solution Commonly known as: CHRONULAC Take 20 g by mouth daily.   levETIRAcetam 500 MG tablet Commonly known as: KEPPRA Take 500 mg by mouth 2 (two) times daily.   lisinopril 5 MG tablet Commonly known as: ZESTRIL Take 1 tablet (5 mg total) by mouth daily. What changed: how much to take   metoprolol succinate 25 MG 24 hr tablet Commonly known as: TOPROL-XL Take 25 mg by mouth daily.   Mylanta 200-200-20 MG/5ML suspension Generic drug: alum & mag hydroxide-simeth Take 30 mLs by mouth every 12 (twelve) hours as needed.   nicotine polacrilex 2 MG gum Commonly known as: NICORETTE Take 2 mg by mouth every 4 (four) hours as needed for smoking cessation.   nitroGLYCERIN 0.4 MG SL tablet Commonly known as: NITROSTAT Place 0.4 mg under the tongue every 5 (five) minutes as needed for chest pain.   nystatin powder Generic drug: nystatin Apply topically 2 (two) times daily. Groin rash   ondansetron 4 MG tablet Commonly known as: ZOFRAN Take 4 mg by mouth every 8 (eight) hours as needed for nausea or vomiting.   pantoprazole 20 MG tablet Commonly known as: PROTONIX Take 20 mg by mouth daily.   polyethylene glycol 17 g packet Commonly known as: MIRALAX / GLYCOLAX Take 17 g by mouth daily.   sertraline 25 MG tablet Commonly known as:  ZOLOFT Take 25 mg by mouth daily.   simvastatin 10 MG tablet Commonly known as: ZOCOR Take 10 mg by mouth at bedtime.   tiotropium 18 MCG inhalation capsule Commonly known as: SPIRIVA Place 18 mcg into inhaler and inhale daily.   trolamine salicylate 10 % cream Commonly known as: ASPERCREME Apply 1 application topically 3 (three) times daily as needed. To neck      Follow-up Information    MD at SNF in 1 week Follow up.          No Known Allergies   Procedures/Studies: DG Abdomen 1 View  Result Date: 08/03/2019 CLINICAL DATA:  Evaluate NG tube placement EXAM: ABDOMEN - 1 VIEW COMPARISON:  KUB April 08, 2019.  CT scan August 03, 2019. FINDINGS: The side port of the NG tube is probably at or just below the GE junction. The distal tip is in the region of the gastric body. Dilated loops of bowel are again identified. IMPRESSION: The side port of the NG tube is probably at or just below the GE junction. The distal tip is in the region of the gastric body. Electronically Signed   By: Dorise Bullion III M.D   On: 08/03/2019 16:39   MR BRAIN  WO CONTRAST  Result Date: 08/08/2019 CLINICAL DATA:  Focal neuro deficit, greater than 6 hours, stroke suspected. Additional history provided: RN called rapid response as patient reported left side of face was numb. EXAM: MRI HEAD WITHOUT CONTRAST TECHNIQUE: Multiplanar, multiecho pulse sequences of the brain and surrounding structures were obtained without intravenous contrast. COMPARISON:  Brain MRI 10/01/2017 FINDINGS: Brain: There is no evidence of acute infarct. No evidence of intracranial mass. No midline shift or extra-axial fluid collection. No chronic intracranial blood products. 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 Also redemonstrated are chronic cortically based infarcts within the right frontal operculum and right occipital  lobe, as well as a small chronic left cerebellar infarct. There is a background of moderate chronic small vessel ischemic disease. Moderate generalized parenchymal atrophy. 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 Flow voids otherwise maintained within the proximal large arterial vessels. Skull and upper cervical spine: No focal marrow lesion. Incompletely assessed upper cervical spondylosis with multilevel spinal canal stenosis. Sinuses/Orbits: Visualized orbits demonstrate no acute abnormality. Minimal scattered paranasal sinus mucosal thickening. Right sphenoid sinus mucous retention cyst, unchanged. No significant mastoid effusion. IMPRESSION: 1. No evidence of acute intracranial abnormality, including acute infarct. 2. 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 3. 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 4. Background of moderate generalized parenchymal atrophy and chronic small vessel ischemic disease. 5. 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 6. Incompletely assessed upper cervical spondylosis with spinal canal stenosis. Electronically Signed   By: Kellie Simmering DO   On: 08/08/2019 21:32   CT ABDOMEN PELVIS W CONTRAST  Result Date: 08/03/2019 CLINICAL DATA:  Abdominal pain, distention, nausea, vomiting and diarrhea. EXAM: CT ABDOMEN AND PELVIS WITH CONTRAST TECHNIQUE: Multidetector CT imaging of the abdomen and pelvis was performed using the standard protocol following bolus administration of intravenous contrast. CONTRAST:  148mL OMNIPAQUE IOHEXOL 300 MG/ML  SOLN COMPARISON:  04/02/2019 FINDINGS: Lower chest: Stable bibasilar scarring. Hepatobiliary: No focal liver abnormality is seen. No gallstones, gallbladder wall thickening, or biliary dilatation. Pancreas: Unremarkable. No  pancreatic ductal dilatation or surrounding inflammatory changes. Spleen: Normal in size without focal abnormality. Adrenals/Urinary Tract: Adrenal glands demonstrates stable bilateral nodules. Stable cortical scarring of both kidneys. No renal calculi, focal lesion, or hydronephrosis. Bladder is unremarkable. Stomach/Bowel: The stomach is moderately dilated and contains an air-fluid level. There is some mild dilatation of proximal jejunum. The rest of the small bowel is decompressed. Stable gaseous distention of predominantly the transverse colon without obstructing lesion identified. A trace amount of free fluid is seen adjacent to the proximal descending colon. No free air. Vascular/Lymphatic: No significant vascular findings are present. No enlarged abdominal or pelvic lymph nodes. Reproductive: Prostate is unremarkable. Other: No hernias or focal abscess identified. Musculoskeletal: No acute or significant osseous findings. IMPRESSION: 1. Stable gaseous distention of predominantly the transverse colon without obstructing lesion identified by CT. A trace amount of free fluid is seen adjacent to the proximal descending colon. 2. Gastric distension and some distention of proximal small bowel. Findings are suggestive of ileus or enteritis. 3. Stable bilateral adrenal nodules. 4. Stable bilateral renal cortical scarring. Electronically Signed   By: Aletta Edouard M.D.   On: 08/03/2019 15:04   US Carotid Bilateral (at [facility] and [facility] only)  Result Date: 08/09/2019 CLINICAL DATA:  TIA. Hypertension, coronary artery disease, previous tobacco abuse EXAM: BILATERAL CAROTID DUPLEX ULTRASOUND TECHNIQUE: Pearline Cables scale imaging, color Doppler and duplex ultrasound were performed of bilateral carotid and vertebral arteries in the neck. COMPARISON:  CTA neck 10/02/2017 FINDINGS: Criteria: Quantification of carotid stenosis is based on velocity parameters that correlate the residual internal carotid diameter with NASCET-based  stenosis levels, using the diameter of the distal internal carotid lumen as the denominator for stenosis measurement. The following velocity measurements were obtained: RIGHT ICA: 148/34 cm/sec CCA: 123456 cm/sec SYSTOLIC ICA/CCA RATIO:  1.9 ECA: 58 cm/sec LEFT ICA: 175/38 cm/sec CCA: 0000000 cm/sec SYSTOLIC ICA/CCA RATIO:  3.4 ECA: 118 cm/sec RIGHT CAROTID ARTERY: Mild tortuosity. Eccentric noncalcified plaque in the distal common carotid artery and bulb without high-grade stenosis. Noncalcified plaque in the distal ICA with elevated peak systolic velocities. RIGHT VERTEBRAL ARTERY:  Normal flow direction and waveform. LEFT CAROTID ARTERY: Mild tortuosity. Partially calcified plaque in the bulb and proximal ICA. Noncalcified plaque extends through the mid and distal ICA resulting in at least mild stenosis, with elevated peak systolic velocities, probably overestimated due to poor angle correction. LEFT VERTEBRAL ARTERY:  Normal flow direction and waveform. IMPRESSION: 1. 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 2.  Antegrade bilateral vertebral arterial flow. Electronically Signed   By: Lucrezia Europe M.D.   On: 08/09/2019 09:15   DG Abd 2 Views  Result Date: 08/06/2019 CLINICAL DATA:  Abdominal distension. EXAM: ABDOMEN - 2 VIEW COMPARISON:  Plain films of the abdomen and CT abdomen and pelvis 08/03/2019. Plain films of the abdomen 04/27/2019. FINDINGS: Gaseous distention of the colon is unchanged. No free intraperitoneal air. No unexpected abdominal calcification. Atherosclerosis is noted. The patient is status post left hip replacement. IMPRESSION: Gaseous distention of the colon most compatible with ileus. Electronically Signed   By: Inge Rise M.D.   On: 08/06/2019 15:30   DG Abd Portable 1V  Result Date: 08/10/2019 CLINICAL DATA:  Evaluate ileus. Status post NG tube removal. EXAM: PORTABLE ABDOMEN - 1 VIEW COMPARISON:  08/08/2019 FINDINGS: Persistent dilated  air-filled colon with scattered nondilated small bowel loops. The appearance is unchanged when compared with 08/08/2019. IMPRESSION: 1. No change in colonic distension as noted on 08/08/2019. Findings are favored to represent a colonic ileus versus pseudo obstruction of the large bowel. Electronically Signed   By: Kerby Moors M.D.   On: 08/10/2019 15:42   DG Abd Portable 1V  Result Date: 08/08/2019 CLINICAL DATA:  Abdominal distension. EXAM: PORTABLE ABDOMEN - 1 VIEW COMPARISON:  08/06/2019 FINDINGS: Persistent mildly dilated air-filled colon along with scattered nondilated air-filled small bowel loops. Findings suggest a colonic ileus. No free air is identified. The lung bases are grossly clear. The bony structures are unremarkable. Left hip prosthesis noted with adjacent heterotopic ossification. Stable vascular calcifications. IMPRESSION: Colonic ileus bowel gas pattern without findings for obstruction or perforation. Electronically Signed   By: Marijo Sanes M.D.   On: 08/08/2019 08:55   ECHOCARDIOGRAM COMPLETE  Result Date: 08/09/2019   ECHOCARDIOGRAM REPORT   Patient Name:   Calvin [name] Date of Exam: 08/09/2019 Medical Rec #:  [id number]      Height:       71.0 in Accession #:    [id number]     Weight:       212.5 lb Date of Birth:  10-31-1950      BSA:          2.16 m Patient Age:  68 years       BP:           154/84 mmHg Patient Gender: M              HR:           71 bpm. Exam Location:  [facility] Procedure: 2D Echo and Intracardiac Opacification Agent Indications:     TIA  History:         Patient has prior history of Echocardiogram examinations. CAD,                  COPD and Stroke; Risk Factors:Hypertension.  Sonographer:     LTM Referring Phys:  [id number] [place] Diagnosing Phys: Kate Sable MD  Sonographer Comments: Suboptimal apical window. IMPRESSIONS  1. Left ventricular ejection fraction, by visual estimation, is 55 to 60%. The left ventricle has normal function. There is no  left ventricular hypertrophy.  2. Definity contrast agent was given IV to delineate the left ventricular endocardial borders.  3. Left ventricular diastolic parameters are consistent with Grade I diastolic dysfunction (impaired relaxation).  4. Global right ventricle has normal systolic function.The right ventricular size is not assessed. No increase in right ventricular wall thickness.  5. Left atrial size was not well visualized.  6. Right atrial size was not well visualized.  7. The mitral valve is grossly normal. No evidence of mitral valve regurgitation.  8. The tricuspid valve is not well visualized. Tricuspid valve regurgitation is trivial.  9. The aortic valve is normal in structure. Aortic valve regurgitation is not visualized. 10. The pulmonic valve was not well visualized. Pulmonic valve regurgitation is not visualized. 11. Aortic dilatation noted. 12. There is moderate dilatation of the aortic root measuring 46 mm. 13. The inferior vena cava is normal in size with greater than 50% respiratory variability, suggesting right atrial pressure of 3 mmHg. 14. The interatrial septum was not well visualized. FINDINGS  Left Ventricle: Left ventricular ejection fraction, by visual estimation, is 55 to 60%. The left ventricle has normal function. Definity contrast agent was given IV to delineate the left ventricular endocardial borders. The left ventricular internal cavity size was the left ventricle is normal in size. There is no left ventricular hypertrophy. Left ventricular diastolic parameters are consistent with Grade I diastolic dysfunction (impaired relaxation). Right Ventricle: The right ventricular size is not assessed. No increase in right ventricular wall thickness. Global RV systolic function is has normal systolic function. Left Atrium: Left atrial size was not well visualized. Right Atrium: Right atrial size was not well visualized Pericardium: There is no evidence of pericardial effusion. Mitral  Valve: The mitral valve is grossly normal. No evidence of mitral valve regurgitation. Tricuspid Valve: The tricuspid valve is not well visualized. Tricuspid valve regurgitation is trivial. Aortic Valve: The aortic valve is normal in structure. Aortic valve regurgitation is not visualized. Aortic valve mean gradient measures 1.0 mmHg. Aortic valve peak gradient measures 2.0 mmHg. Aortic valve area, by VTI measures 4.06 cm. Pulmonic Valve: The pulmonic valve was not well visualized. Pulmonic valve regurgitation is not visualized. Aorta: Aortic dilatation noted. There is moderate dilatation of the aortic root measuring 46 mm. Venous: The inferior vena cava is normal in size with greater than 50% respiratory variability, suggesting right atrial pressure of 3 mmHg. IAS/Shunts: The interatrial septum was not well visualized.  LEFT VENTRICLE PLAX 2D LVIDd:         4.68 cm  Diastology LVIDs:  3.52 cm  LV e' lateral:   9.79 cm/s LV PW:         1.03 cm  LV E/e' lateral: 5.5 LV IVS:        1.19 cm  LV e' medial:    6.09 cm/s LVOT diam:     2.50 cm  LV E/e' medial:  8.8 LV SV:         50 ml LV SV Index:   22.42 LVOT Area:     4.91 cm  AORTIC VALVE AV Area (Vmax):    4.46 cm AV Area (Vmean):   4.17 cm AV Area (VTI):     4.06 cm AV Vmax:           71.50 cm/s AV Vmean:          49.100 cm/s AV VTI:            0.145 m AV Peak Grad:      2.0 mmHg AV Mean Grad:      1.0 mmHg LVOT Vmax:         65.00 cm/s LVOT Vmean:        41.700 cm/s LVOT VTI:          0.120 m LVOT/AV VTI ratio: 0.83  AORTA Ao Root diam: 4.65 cm MV E velocity: 53.40 cm/s 103 cm/s  TRICUSPID VALVE MV A velocity: 90.70 cm/s 70.3 cm/s TR Peak grad:   29.4 mmHg MV E/A ratio:  0.59       1.5       TR Vmax:        271.00 cm/s                                      SHUNTS                                     Systemic VTI:  0.12 m                                     Systemic Diam: 2.50 cm  Kate Sable MD Electronically signed by Kate Sable MD Signature  Date/Time: 08/09/2019/3:25:06 PM    Final        Subjective: No overnight events.  Still has twitching over the left arm and occasionally on the face but much better from previous days.  Discharge Exam: Vitals:   08/14/19 0353 08/14/19 0730  BP: 103/68 108/67  Pulse: 72 69  Resp:  18  Temp: 97.9 F (36.6 C) 97.9 F (36.6 C)  SpO2: 97% 98%   Vitals:   08/13/19 1931 08/13/19 2343 08/14/19 0353 08/14/19 0730  BP: 91/60 96/61 103/68 108/67  Pulse: 71 70 72 69  Resp:    18  Temp: (!) 97.4 F (36.3 C)  97.9 F (36.6 C) 97.9 F (36.6 C)  TempSrc: Oral  Oral Oral  SpO2: 96% 100% 97% 98%  Weight:      Height:        General: Elderly male not in distress HEENT: Moist mucosa, supple neck Chest: Clear bilaterally CVs: Normal S1-S2 GI: Soft, nondistended, nontender Musculoskeletal: Warm, no edema CNs: Alert and oriented, intermittent twitching over the left hand.    The results of significant diagnostics from this hospitalization (  including imaging, microbiology, ancillary and laboratory) are listed below for reference.     Microbiology: Recent Results (from the past 240 hour(s))  SARS Coronavirus 2 by RT PCR (hospital order, performed in [facility] hospital lab) Nasopharyngeal Nasopharyngeal Swab     Status: None   Collection Time: 08/08/19  1:21 PM   Specimen: Nasopharyngeal Swab  Result Value Ref Range Status   SARS Coronavirus 2 NEGATIVE NEGATIVE Final    Comment: (NOTE) SARS-CoV-2 target nucleic acids are NOT DETECTED. The SARS-CoV-2 RNA is generally detectable in upper and lower respiratory specimens during the acute phase of infection. The lowest concentration of SARS-CoV-2 viral copies this assay can detect is 250 copies / mL. A negative result does not preclude SARS-CoV-2 infection and should not be used as the sole basis for treatment or other patient management decisions.  A negative result may occur with improper specimen collection / handling, submission  of specimen other than nasopharyngeal swab, presence of viral mutation(s) within the areas targeted by this assay, and inadequate number of viral copies (<250 copies / mL). A negative result must be combined with clinical observations, patient history, and epidemiological information. Fact Sheet for Patients:   StrictlyIdeas.no Fact Sheet for Healthcare Providers: BankingDealers.co.za This test is not yet approved or cleared  by the [place] FDA and has been authorized for detection and/or diagnosis of SARS-CoV-2 by FDA under an Emergency Use Authorization (EUA).  This EUA will remain in effect (meaning this test can be used) for the duration of the COVID-19 declaration under Section 564(b)(1) of the Act, 21 U.S.C. section 360bbb-3(b)(1), unless the authorization is terminated or revoked sooner. Performed at [facility], [place], [place] [address]   SARS CORONAVIRUS 2 (TAT 6-24 HRS) Nasopharyngeal Nasopharyngeal Swab     Status: None   Collection Time: 08/12/19 10:07 PM   Specimen: Nasopharyngeal Swab  Result Value Ref Range Status   SARS Coronavirus 2 NEGATIVE NEGATIVE Final    Comment: (NOTE) SARS-CoV-2 target nucleic acids are NOT DETECTED. The SARS-CoV-2 RNA is generally detectable in upper and lower respiratory specimens during the acute phase of infection. Negative results do not preclude SARS-CoV-2 infection, do not rule out co-infections with other pathogens, and should not be used as the sole basis for treatment or other patient management decisions. Negative results must be combined with clinical observations, patient history, and epidemiological information. The expected result is Negative. Fact Sheet for Patients: SugarRoll.be Fact Sheet for Healthcare Providers: https://www.woods-mathews.com/ This test is not yet approved or cleared by the [place] FDA and  has been authorized for detection and/or diagnosis of SARS-CoV-2 by FDA under an Emergency Use Authorization (EUA). This EUA will remain  in effect (meaning this test can be used) for the duration of the COVID-19 declaration under Section 56 4(b)(1) of the Act, 21 U.S.C. section 360bbb-3(b)(1), unless the authorization is terminated or revoked sooner. Performed at [facility], [place] [address], [place], [place] [address]      Labs: BNP (last 3 results) No results for input(s): BNP in the last 8760 hours. Basic Metabolic Panel: Recent Labs  Lab 08/08/19 0830 08/10/19 1057 08/12/19 0410 08/13/19 0400  NA 141 141 140 143  K 4.0 3.7 3.9 3.9  CL 108 111 109 109  CO2 22 20* 21* 24  GLUCOSE 83 80 81 112*  BUN 8 8 9 14   CREATININE 1.11 0.92 1.02 1.08  CALCIUM 8.8* 8.6* 8.8* 8.9  MG 1.9 2.0 1.9  2.1   Liver Function Tests: Recent Labs  Lab 08/08/19 0830 08/12/19 0410 08/13/19 0400  AST 16 31 38  ALT 13 30 41  ALKPHOS 51 55 59  BILITOT 0.7 0.5 0.3  PROT 7.0 6.9 6.9  ALBUMIN 3.5 3.6 3.4*   No results for input(s): LIPASE, AMYLASE in the last 168 hours. No results for input(s): AMMONIA in the last 168 hours. CBC: Recent Labs  Lab 08/08/19 0830 08/12/19 0410 08/13/19 0400  WBC 4.1 4.3 4.3  NEUTROABS 2.4 2.5 2.6  HGB 12.6* 12.6* 12.1*  HCT 38.0* 37.7* 37.5*  MCV 89.2 88.7 92.4  PLT 172 229 235   Cardiac Enzymes: No results for input(s): CKTOTAL, CKMB, CKMBINDEX, TROPONINI in the last 168 hours. BNP: Invalid input(s): POCBNP CBG: Recent Labs  Lab 08/08/19 1010  GLUCAP 82   D-Dimer No results for input(s): DDIMER in the last 72 hours. Hgb A1c No results for input(s): HGBA1C in the last 72 hours. Lipid Profile No results for input(s): CHOL, HDL, LDLCALC, TRIG, CHOLHDL, LDLDIRECT in the last 72 hours. Thyroid function studies No results for input(s): TSH, T4TOTAL, T3FREE, THYROIDAB in the last 72 hours.  Invalid input(s): FREET3 Anemia  work up No results for input(s): VITAMINB12, FOLATE, FERRITIN, TIBC, IRON, RETICCTPCT in the last 72 hours. Urinalysis    Component Value Date/Time   COLORURINE YELLOW (A) 08/03/2019 1009   APPEARANCEUR HAZY (A) 08/03/2019 1009   APPEARANCEUR Clear 08/14/2014 0933   LABSPEC 1.016 08/03/2019 1009   LABSPEC 1.015 08/14/2014 0933   PHURINE 6.0 08/03/2019 1009   GLUCOSEU NEGATIVE 08/03/2019 1009   GLUCOSEU Negative 08/14/2014 0933   HGBUR SMALL (A) 08/03/2019 1009   BILIRUBINUR NEGATIVE 08/03/2019 1009   BILIRUBINUR Negative 08/14/2014 0933   KETONESUR NEGATIVE 08/03/2019 1009   PROTEINUR 30 (A) 08/03/2019 1009   UROBILINOGEN 0.2 06/12/2010 0103   NITRITE NEGATIVE 08/03/2019 1009   LEUKOCYTESUR LARGE (A) 08/03/2019 1009   LEUKOCYTESUR Negative 08/14/2014 0933   Sepsis Labs Invalid input(s): PROCALCITONIN,  WBC,  LACTICIDVEN Microbiology Recent Results (from the past 240 hour(s))  SARS Coronavirus 2 by RT PCR (hospital order, performed in [place] hospital lab) Nasopharyngeal Nasopharyngeal Swab     Status: None   Collection Time: 08/08/19  1:21 PM   Specimen: Nasopharyngeal Swab  Result Value Ref Range Status   SARS Coronavirus 2 NEGATIVE NEGATIVE Final    Comment: (NOTE) SARS-CoV-2 target nucleic acids are NOT DETECTED. The SARS-CoV-2 RNA is generally detectable in upper and lower respiratory specimens during the acute phase of infection. The lowest concentration of SARS-CoV-2 viral copies this assay can detect is 250 copies / mL. A negative result does not preclude SARS-CoV-2 infection and should not be used as the sole basis for treatment or other patient management decisions.  A negative result may occur with improper specimen collection / handling, submission of specimen other than nasopharyngeal swab, presence of viral mutation(s) within the areas targeted by this assay, and inadequate number of viral copies (<250 copies / mL). A negative result must be combined with  clinical observations, patient history, and epidemiological information. Fact Sheet for Patients:   StrictlyIdeas.no Fact Sheet for Healthcare Providers: BankingDealers.co.za This test is not yet approved or cleared  by the [place] FDA and has been authorized for detection and/or diagnosis of SARS-CoV-2 by FDA under an Emergency Use Authorization (EUA).  This EUA will remain in effect (meaning this test can be used) for the duration of the COVID-19 declaration under Section 564(b)(1) of  the Act, 21 U.S.C. section 360bbb-3(b)(1), unless the authorization is terminated or revoked sooner. Performed at [facility], [place], [place] [address]   SARS CORONAVIRUS 2 (TAT 6-24 HRS) Nasopharyngeal Nasopharyngeal Swab     Status: None   Collection Time: 08/12/19 10:07 PM   Specimen: Nasopharyngeal Swab  Result Value Ref Range Status   SARS Coronavirus 2 NEGATIVE NEGATIVE Final    Comment: (NOTE) SARS-CoV-2 target nucleic acids are NOT DETECTED. The SARS-CoV-2 RNA is generally detectable in upper and lower respiratory specimens during the acute phase of infection. Negative results do not preclude SARS-CoV-2 infection, do not rule out co-infections with other pathogens, and should not be used as the sole basis for treatment or other patient management decisions. Negative results must be combined with clinical observations, patient history, and epidemiological information. The expected result is Negative. Fact Sheet for Patients: SugarRoll.be Fact Sheet for Healthcare Providers: https://www.woods-mathews.com/ This test is not yet approved or cleared by the [place] FDA and  has been authorized for detection and/or diagnosis of SARS-CoV-2 by FDA under an Emergency Use Authorization (EUA). This EUA will remain  in effect (meaning this test can be used) for the duration of  the COVID-19 declaration under Section 56 4(b)(1) of the Act, 21 U.S.C. section 360bbb-3(b)(1), unless the authorization is terminated or revoked sooner. Performed at [facility], [place] [address] [place], [place] [address]      Time coordinating discharge: 35 minutes  SIGNED:   Louellen Molder, MD  [facility] 08/14/2019, 10:45 AM Pager   If 7PM-7AM, please contact night-coverage [email or web address] Password [email or web address]

## 2019-08-14 NOTE — TOC Transition Note (Addendum)
Transition of Care [facility]) - CM/SW Discharge Note   Patient Details  Name: Calvin [name] MRN: [id number] Date of Birth: 1951-06-25  Transition of Care [facility]) CM/SW Contact:  Ross Ludwig, LCSW Phone Number: 08/14/2019, 3:07 PM   Clinical Narrative:     Patient to be d/c'ed today to [facility] room C9.  Patient and family agreeable to plans will transport via ems RN to call report to [phone] [facility] nurse.  Physician updated patient's daughter that he will be discharging today.     Final next level of care: Skilled Nursing Facility Barriers to Discharge: Barriers Resolved   Patient Goals and CMS Choice Patient states their goals for this hospitalization and ongoing recovery are:: To return to [facility] for rehab, then return back home. CMS Medicare.gov Compare Post Acute Care list provided to:: Patient Choice offered to / list presented to : Patient  Discharge Placement   Existing PASRR number confirmed : 08/12/19          Patient chooses bed at: [facility] [place] Patient to be transferred to facility by: [facility] Name of family member notified: Daughter [name] [phone] was updated by physician to inform her he is discharging today. Patient and family notified of of transfer: 08/14/19  Discharge Plan and Services     Post Acute Care Choice: [place]          DME Arranged: N/A DME Agency: NA       HH Arranged: NA HH Agency: NA        Social Determinants of Health (SDOH) Interventions     Readmission Risk Interventions Readmission Risk Prevention Plan 08/08/2019  Transportation Screening Complete  HRI or [place] Not Complete  HRI or Home Care Consult comments not indicated from LTC  Medication Review (RN Care Manager) Complete  Some recent data might be hidden

## 2019-08-14 NOTE — Progress Notes (Signed)
Patient has intermittent twitching/tremors primarily on the left side. MD notified

## 2019-08-14 NOTE — Care Management Important Message (Signed)
Important Message  Patient Details  Name: Calvin [name] MRN: [id number] Date of Birth: 1951-03-01   Medicare Important Message Given:  Yes     Juliann Pulse A Starletta Houchin 08/14/2019, 11:13 AM

## 2019-09-19 ENCOUNTER — Other Ambulatory Visit: Payer: Self-pay

## 2019-09-19 ENCOUNTER — Emergency Department: Payer: Medicare Other

## 2019-09-19 DIAGNOSIS — R1084 Generalized abdominal pain: Secondary | ICD-10-CM | POA: Insufficient documentation

## 2019-09-19 DIAGNOSIS — Z79899 Other long term (current) drug therapy: Secondary | ICD-10-CM | POA: Diagnosis not present

## 2019-09-19 DIAGNOSIS — J449 Chronic obstructive pulmonary disease, unspecified: Secondary | ICD-10-CM | POA: Insufficient documentation

## 2019-09-19 DIAGNOSIS — I2510 Atherosclerotic heart disease of native coronary artery without angina pectoris: Secondary | ICD-10-CM | POA: Diagnosis not present

## 2019-09-19 DIAGNOSIS — I119 Hypertensive heart disease without heart failure: Secondary | ICD-10-CM | POA: Diagnosis not present

## 2019-09-19 LAB — URINALYSIS, COMPLETE (UACMP) WITH MICROSCOPIC
Bilirubin Urine: NEGATIVE
Glucose, UA: NEGATIVE mg/dL
Hgb urine dipstick: NEGATIVE
Ketones, ur: NEGATIVE mg/dL
Nitrite: NEGATIVE
Protein, ur: NEGATIVE mg/dL
Specific Gravity, Urine: 1.013 (ref 1.005–1.030)
pH: 6 (ref 5.0–8.0)

## 2019-09-19 NOTE — ED Notes (Signed)
Lab draw attempted by this RN and by lab without success.

## 2019-09-19 NOTE — ED Triage Notes (Signed)
Patient to ED due to x-ray done at nsg facility showing possible bowel obstruction.

## 2019-09-19 NOTE — ED Triage Notes (Signed)
First Nurse: patient brought in by ems from [facility] with complaint of abdominal pain. Per EMS patient had an x-ray early today which showed possible sbo. Vital signs per ems bp 140/83, temp 98.4, 96% room air and hr 60.

## 2019-09-20 ENCOUNTER — Emergency Department
Admission: EM | Admit: 2019-09-20 | Discharge: 2019-09-20 | Disposition: A | Discharge status: Home / Self Care | Payer: Medicare Other | Attending: Emergency Medicine | Admitting: Emergency Medicine

## 2019-09-20 ENCOUNTER — Emergency Department: Payer: Medicare Other

## 2019-09-20 DIAGNOSIS — R1084 Generalized abdominal pain: Secondary | ICD-10-CM | POA: Diagnosis not present

## 2019-09-20 LAB — COMPREHENSIVE METABOLIC PANEL
ALT: 60 U/L — ABNORMAL HIGH (ref 0–44)
AST: 54 U/L — ABNORMAL HIGH (ref 15–41)
Albumin: 4.6 g/dL (ref 3.5–5.0)
Alkaline Phosphatase: 94 U/L (ref 38–126)
Anion gap: 15 (ref 5–15)
BUN: 22 mg/dL (ref 8–23)
CO2: 25 mmol/L (ref 22–32)
Calcium: 9.2 mg/dL (ref 8.9–10.3)
Chloride: 98 mmol/L (ref 98–111)
Creatinine, Ser: 1.25 mg/dL — ABNORMAL HIGH (ref 0.61–1.24)
GFR calc Af Amer: >60 mL/min (ref >60)
GFR calc non Af Amer: 58 mL/min — ABNORMAL LOW (ref >60)
Glucose, Bld: 96 mg/dL (ref 70–99)
Potassium: 3.8 mmol/L (ref 3.5–5.1)
Sodium: 138 mmol/L (ref 135–145)
Total Bilirubin: 0.7 mg/dL (ref 0.3–1.2)
Total Protein: 8.7 g/dL — ABNORMAL HIGH (ref 6.5–8.1)

## 2019-09-20 LAB — LIPASE, BLOOD: Lipase: 32 U/L (ref 11–51)

## 2019-09-20 LAB — CBC
HCT: 44.5 % (ref 39.0–52.0)
Hemoglobin: 13.5 g/dL (ref 13.0–17.0)
MCH: 29 pg (ref 26.0–34.0)
MCHC: 30.3 g/dL (ref 30.0–36.0)
MCV: 95.5 fL (ref 80.0–100.0)
Platelets: 330 10*3/uL (ref 150–400)
RBC: 4.66 MIL/uL (ref 4.22–5.81)
RDW: 13.5 % (ref 11.5–15.5)
WBC: 5.7 10*3/uL (ref 4.0–10.5)
nRBC: 0 % (ref 0.0–0.2)

## 2019-09-20 MED ORDER — DOCUSATE SODIUM 100 MG PO CAPS: 100.0000 mg | ORAL_CAPSULE | Freq: Two times a day (BID) | ORAL | 2 refills | Status: AC

## 2019-09-20 MED ORDER — IOHEXOL 9 MG/ML PO SOLN
500.0000 mL | Freq: Once | ORAL | Status: DC | PRN
  Administered 2019-09-20: 01:00:00 500 mL via ORAL

## 2019-09-20 MED ORDER — SENNA 8.6 MG PO TABS: 1.0000 | ORAL_TABLET | Freq: Every day | ORAL | 0 refills | Status: DC

## 2019-09-20 NOTE — ED Notes (Signed)
SNF ([place]) [phone] notified of pt's discharge and plan of care.

## 2019-09-20 NOTE — ED Notes (Signed)
Patient transported to CT 

## 2019-09-20 NOTE — ED Notes (Signed)
Patient incontinent of urine. Patient moved to triage 4 and changed to a clean diaper and repositioned in the recliner.

## 2019-09-20 NOTE — ED Notes (Signed)
Patient repositioned in the recliner.

## 2019-09-20 NOTE — ED Provider Notes (Signed)
[facility] Emergency Department Provider Note  ____________________________________________  Time seen: Approximately 6:32 AM  I have reviewed the triage vital signs and the nursing notes.   HISTORY  Chief Complaint Abdominal Pain   HPI Calvin [name] is a 69 y.o. male with a history of anemia, COPD, CAD, hypertension, stroke who presents from his nursing home for concerns of small bowel obstruction.  Patient was complaining earlier today of abdominal pain.  An x-ray was done which was concerning for possible SBO and he was sent here for evaluation.  Patient at this time reports that his abdominal pain has resolved.  He has had runny bowel movements over the last few days but only once a day.  He denies of any abdominal pain, he is passing flatus, no nausea or vomiting, no dysuria or hematuria.   Past Medical History:  Diagnosis Date  . Alcohol abuse    drinks on weekend  . Anemia   . Anxiety   . Arthritis   . Cancer ([place])    colon,throat  . COPD (chronic obstructive pulmonary disease) ([place])   . Coronary artery disease   . Depression   . Gout   . Hypertension   . Myocardial infarction ([place])   . Neuromuscular disorder ([place])   . Seizures ([place])    last 6 months ago  . Stroke [facility])    multiple  left side weakness  . Tremors of nervous system     Patient Active Problem List   Diagnosis Date Noted  . Muscle twitching 08/14/2019  . Acute UTI 08/03/2019  . Ileus ([place]) 08/03/2019  . Hypokalemia 08/03/2019  . QT prolongation 08/03/2019  . Ogilvie's syndrome   . Abdominal pain 04/03/2019  . Left-sided weakness 09/30/2017  . Seizures ([place]) 09/30/2017  . HTN (hypertension) 09/30/2017  . CAD (coronary artery disease) 09/30/2017  . COPD (chronic obstructive pulmonary disease) ([place]) 09/30/2017  . Depression with anxiety 09/30/2017    Past Surgical History:  Procedure Laterality Date  . CARPAL TUNNEL RELEASE Left 10/19/2015   Procedure: CARPAL  TUNNEL RELEASE;  Surgeon: Hessie Knows, MD;  Location: [facility] ORS;  Service: Orthopedics;  Laterality: Left;  . COLON SURGERY    . JOINT REPLACEMENT     left partial hip   . THROAT SURGERY  2013   cancer    Prior to Admission medications   Medication Sig Start Date End Date Taking? Authorizing Provider  acetaminophen (TYLENOL) 650 MG CR tablet Take 650 mg by mouth every 6 (six) hours.     [provider]  ALPRAZolam Duanne Moron) 0.5 MG tablet Take 2 tablets (1 mg total) by mouth 3 (three) times daily. 08/14/19   Dhungel, Flonnie Overman, MD  alum & mag hydroxide-simeth (MYLANTA) 200-200-20 MG/5ML suspension Take 30 mLs by mouth every 12 (twelve) hours as needed.     [provider]  aspirin 81 MG tablet Take 81 mg by mouth daily.    [provider]  bisacodyl (DULCOLAX) 10 MG suppository Place 10 mg rectally daily.    [provider]  Dextromethorphan-Benzocaine (CEPACOL SORE THROAT & COUGH) 5-7.5 MG LOZG Use as directed 1 lozenge in the mouth or throat every 2 (two) hours as needed (sore throat).    [provider]  docusate sodium (COLACE) 100 MG capsule Take 1 capsule (100 mg total) by mouth 2 (two) times daily. 09/20/19 09/19/20  Rudene Re, MD  ergocalciferol (VITAMIN D2) 50000 units capsule Take 50,000 Units by mouth every 30 (thirty)  days.    [provider]  feeding supplement, ENSURE ENLIVE, (ENSURE ENLIVE) LIQD Take 237 mLs by mouth 3 (three) times daily between meals. 08/14/19   Dhungel, Flonnie Overman, MD  furosemide (LASIX) 40 MG tablet Take 60 mg by mouth daily.     [provider]  gabapentin (NEURONTIN) 400 MG capsule Take 2 capsules (800 mg total) by mouth 3 (three) times daily. 10/02/17   Max Sane, MD  HYDROcodone-acetaminophen (NORCO/VICODIN) 5-325 MG tablet Take 1 tablet by mouth every 6 (six) hours as needed for moderate pain or severe pain. 08/14/19   Dhungel, Flonnie Overman, MD  lactulose (CHRONULAC) 10 GM/15ML solution Take 20 g  by mouth daily.    [provider]  levETIRAcetam (KEPPRA) 500 MG tablet Take 500 mg by mouth 2 (two) times daily.     [provider]  lisinopril (ZESTRIL) 5 MG tablet Take 1 tablet (5 mg total) by mouth daily. 08/14/19   Dhungel, Flonnie Overman, MD  metoprolol succinate (TOPROL-XL) 25 MG 24 hr tablet Take 25 mg by mouth daily.    [provider]  nicotine polacrilex (NICORETTE) 2 MG gum Take 2 mg by mouth every 4 (four) hours as needed for smoking cessation.    [provider]  nitroGLYCERIN (NITROSTAT) 0.4 MG SL tablet Place 0.4 mg under the tongue every 5 (five) minutes as needed for chest pain.    [provider]  nystatin (NYSTATIN) powder Apply topically 2 (two) times daily. Groin rash    [provider]  ondansetron (ZOFRAN) 4 MG tablet Take 4 mg by mouth every 8 (eight) hours as needed for nausea or vomiting.    [provider]  pantoprazole (PROTONIX) 20 MG tablet Take 20 mg by mouth daily.    [provider]  polyethylene glycol (MIRALAX / GLYCOLAX) packet Take 17 g by mouth daily.     [provider]  senna (SENOKOT) 8.6 MG TABS tablet Take 1 tablet (8.6 mg total) by mouth at bedtime. 09/20/19   Rudene Re, MD  sertraline (ZOLOFT) 25 MG tablet Take 25 mg by mouth daily.    [provider]  simvastatin (ZOCOR) 10 MG tablet Take 10 mg by mouth at bedtime.     [provider]  tiotropium (SPIRIVA) 18 MCG inhalation capsule Place 18 mcg into inhaler and inhale daily.    [provider]  trolamine salicylate (ASPERCREME) 10 % cream Apply 1 application topically 3 (three) times daily as needed. To neck     [provider]  Wheat Dextrin (BENEFIBER DRINK MIX PO) Take 5 g by mouth daily.    [provider]    Allergies Patient has no known allergies.  Family History  Problem Relation Age of Onset  . Cancer Mother   . Hypertension Father   . Leukemia Brother      Social History Social History   Tobacco Use  . Smoking status: Current Every Day Smoker  . Smokeless tobacco: Never Used  Substance Use Topics  . Alcohol use: Yes    Comment: weekends  . Drug use: No    Review of Systems  Constitutional: Negative for fever. Eyes: Negative for visual changes. ENT: Negative for sore throat. Neck: No neck pain  Cardiovascular: Negative for chest pain. Respiratory: Negative for shortness of breath. Gastrointestinal: + abdominal pain, vomiting or diarrhea. Genitourinary: Negative for dysuria. Musculoskeletal: Negative for back pain. Skin: Negative for rash. Neurological: Negative for headaches, weakness or numbness. Psych: No SI or HI  ____________________________________________   PHYSICAL EXAM:  VITAL SIGNS: ED Triage Vitals  Enc Vitals Group     BP 09/19/19 2156 101/61     Pulse Rate 09/19/19 2156 66     Resp 09/19/19 2156 18     Temp 09/19/19 2156 97.8 F (36.6 C)     Temp Source 09/19/19 2156 Oral     SpO2 09/19/19 2156 95 %     Weight 09/19/19 2153 205 lb (93 kg)     Height 09/19/19 2153 5\' 11"  (1.803 m)     Head Circumference --      Peak Flow --      Pain Score 09/19/19 2153 0     Pain Loc --      Pain Edu? --      Excl. in [place]? --     Constitutional: Alert and oriented. Well appearing and in no apparent distress. HEENT:      Head: Normocephalic and atraumatic.         Eyes: Conjunctivae are normal. Sclera is non-icteric.       Mouth/Throat: Mucous membranes are moist.       Neck: Supple with no signs of meningismus. Cardiovascular: Regular rate and rhythm. No murmurs, gallops, or rubs. 2+ symmetrical distal pulses are present in all extremities. No JVD. Respiratory: Normal respiratory effort. Lungs are clear to auscultation bilaterally. No wheezes, crackles, or rhonchi.  Gastrointestinal: Soft, non tender, and non distended with positive bowel sounds. No rebound or guarding. Genitourinary: No CVA  tenderness. Musculoskeletal: Nontender with normal range of motion in all extremities. No edema, cyanosis, or erythema of extremities. Neurologic: Normal speech and language. Face is symmetric. Moving all extremities. No gross focal neurologic deficits are appreciated. Skin: Skin is warm, dry and intact. No rash noted. Psychiatric: Mood and affect are normal. Speech and behavior are normal.  ____________________________________________   LABS (all labs ordered are listed, but only abnormal results are displayed)  Labs Reviewed  COMPREHENSIVE METABOLIC PANEL - Abnormal; Notable for the following components:      Result Value   Creatinine, Ser 1.25 (*)    Total Protein 8.7 (*)    AST 54 (*)    ALT 60 (*)    GFR calc non Af Amer 58 (*)    All other components within normal limits  URINALYSIS, COMPLETE (UACMP) WITH MICROSCOPIC - Abnormal; Notable for the following components:   Color, Urine YELLOW (*)    APPearance CLEAR (*)    Leukocytes,Ua LARGE (*)    Bacteria, UA RARE (*)    All other components within normal limits  URINE CULTURE  LIPASE, BLOOD  CBC   ____________________________________________  EKG  ED ECG REPORT I, Rudene Re, the attending physician, personally viewed and interpreted this ECG.  Normal sinus rhythm with first-degree AV block, rate of 69, normal QTC, normal axis, no ST elevations or depressions.  Unchanged from prior. ____________________________________________  RADIOLOGY  I have personally reviewed the images performed during this visit and I agree with the Radiologist's read.   Interpretation by Radiologist:  CT ABDOMEN PELVIS WO CONTRAST  Result Date: 09/20/2019 CLINICAL DATA:  Abdominal distension EXAM: CT ABDOMEN AND PELVIS WITHOUT CONTRAST TECHNIQUE: Multidetector CT imaging of the abdomen and pelvis was performed following the standard protocol without IV contrast. COMPARISON:  None. FINDINGS: Lower chest: Mild basilar atelectasis.  Hepatobiliary: No focal hepatic lesion on noncontrast exam. Small layering gallstones in the gallbladder. No gallbladder inflammation. Common bile duct normal caliber. Pancreas: Pancreas is normal.  No ductal dilatation. No pancreatic inflammation. Spleen: Normal spleen Adrenals/urinary tract: Bilateral adrenal adenomas. No nephrolithiasis ureterolithiasis. No bladder calculi. Stomach/Bowel: Stomach, small bowel, appendix, and cecum are normal. The ascending transverse and descending colon normal. The colon is gas distended but no evidence of obstruction. There is anastomosis at the proximal sigmoid colon consistent partial LEFT hemicolectomy. Fluid stool within the rectum. Vascular/Lymphatic: Abdominal aorta is normal caliber with atherosclerotic calcification. There is no retroperitoneal or periportal lymphadenopathy. No pelvic lymphadenopathy. Reproductive: Prostate normal Other: No free fluid. Musculoskeletal: No aggressive osseous lesion. IMPRESSION: 1. No evidence of bowel obstruction. Gas-filled colon without obstruction. Prior partial LEFT hemicolectomy. Fluid stool in the rectum. 2. Cholelithiasis without evidence cholecystitis. 3. Bilateral benign adrenal adenomas. 4.  Aortic Atherosclerosis (ICD10-I70.0). Electronically Signed   By: Suzy Bouchard M.D.   On: 09/20/2019 06:23   DG Chest 1 View  Result Date: 09/19/2019 CLINICAL DATA:  Abdominal pain EXAM: CHEST  1 VIEW COMPARISON:  02/18/2019 FINDINGS: Cardiac shadow is stable. Elevation of the right hemidiaphragm is again seen. No focal infiltrate or sizable effusion is noted. No acute bony abnormality is seen. IMPRESSION: No active disease. Electronically Signed   By: Inez Catalina M.D.   On: 09/19/2019 23:46     ____________________________________________   PROCEDURES  Procedure(s) performed: None Procedures Critical Care performed:  None ____________________________________________   INITIAL IMPRESSION / ASSESSMENT AND PLAN / ED  COURSE   69 y.o. male with a history of anemia, COPD, CAD, hypertension, stroke who presents from his nursing home for concerns of small bowel obstruction.  CT negative for SBO, does show gas-filled colon and fluid stool in the rectum.  Patient has been having watery stools once a day for the last few days.  At this time after waiting 9 hours in the waiting room patient has no pain.  He is passing flatus, he has no nausea or vomiting, his abdomen is soft with no tenderness and positive bowel sounds.  His pain is most likely due to the large amount of gas seen on CT.  UA with some bacteria. Patient denies any symptoms of UTI. Will send for culture and hold off treatment at this time. Will discharge home on senna and Colace and follow-up with PCP.  Discussed return precautions.       As part of my medical decision making, I reviewed the following data within the [place] notes reviewed and incorporated, Labs reviewed \, EKG interpreted , Old EKG reviewed, Old chart reviewed, Radiograph reviewed , Notes from prior ED visits and [place] Controlled Substance Database   Please note:  Patient was evaluated in Emergency Department today for the symptoms described in the history of present illness. Patient was evaluated in the context of the global COVID-19 pandemic, which necessitated consideration that the patient might be at risk for infection with the SARS-CoV-2 virus that causes COVID-19. Institutional protocols and algorithms that pertain to the evaluation of patients at risk for COVID-19 are in a state of rapid change based on information released by regulatory bodies including the CDC and federal and state organizations. These policies and algorithms were followed during the patient's care in the ED.  Some ED evaluations and interventions may be delayed as a result of limited staffing during the pandemic.   ____________________________________________   FINAL CLINICAL  IMPRESSION(S) / ED DIAGNOSES   Final diagnoses:  Generalized abdominal pain      NEW MEDICATIONS STARTED DURING THIS VISIT:  ED Discharge Orders  Ordered    senna (SENOKOT) 8.6 MG TABS tablet  Daily at bedtime     09/20/19 0638    docusate sodium (COLACE) 100 MG capsule  2 times daily     09/20/19 [id number]           Note:  This document was prepared using Dragon voice recognition software and may include unintentional dictation errors.    Alfred [place], [place], MD 09/20/19 [phone]

## 2019-09-22 LAB — URINE CULTURE: Culture: >=100000 — AB

## 2019-10-12 ENCOUNTER — Emergency Department: Payer: Medicare Other

## 2019-10-12 ENCOUNTER — Encounter: Payer: Self-pay | Admitting: Emergency Medicine

## 2019-10-12 ENCOUNTER — Other Ambulatory Visit: Payer: Self-pay

## 2019-10-12 ENCOUNTER — Emergency Department
Admission: EM | Admit: 2019-10-12 | Discharge: 2019-10-12 | Disposition: A | Discharge status: Skilled Nursing Facility | Payer: Medicare Other | Attending: Emergency Medicine | Admitting: Emergency Medicine

## 2019-10-12 DIAGNOSIS — I259 Chronic ischemic heart disease, unspecified: Secondary | ICD-10-CM | POA: Diagnosis not present

## 2019-10-12 DIAGNOSIS — J449 Chronic obstructive pulmonary disease, unspecified: Secondary | ICD-10-CM | POA: Insufficient documentation

## 2019-10-12 DIAGNOSIS — R079 Chest pain, unspecified: Secondary | ICD-10-CM | POA: Diagnosis not present

## 2019-10-12 DIAGNOSIS — R531 Weakness: Secondary | ICD-10-CM | POA: Insufficient documentation

## 2019-10-12 DIAGNOSIS — Z7982 Long term (current) use of aspirin: Secondary | ICD-10-CM | POA: Diagnosis not present

## 2019-10-12 DIAGNOSIS — F17200 Nicotine dependence, unspecified, uncomplicated: Secondary | ICD-10-CM | POA: Diagnosis not present

## 2019-10-12 DIAGNOSIS — M79602 Pain in left arm: Secondary | ICD-10-CM | POA: Insufficient documentation

## 2019-10-12 DIAGNOSIS — I10 Essential (primary) hypertension: Secondary | ICD-10-CM | POA: Insufficient documentation

## 2019-10-12 DIAGNOSIS — Z79899 Other long term (current) drug therapy: Secondary | ICD-10-CM | POA: Diagnosis not present

## 2019-10-12 DIAGNOSIS — I69954 Hemiplegia and hemiparesis following unspecified cerebrovascular disease affecting left non-dominant side: Secondary | ICD-10-CM | POA: Insufficient documentation

## 2019-10-12 LAB — TROPONIN I (HIGH SENSITIVITY)
Troponin I (High Sensitivity): 6 ng/L (ref <18)
Troponin I (High Sensitivity): 6 ng/L (ref <18)

## 2019-10-12 LAB — BASIC METABOLIC PANEL
Anion gap: 10 (ref 5–15)
BUN: 28 mg/dL — ABNORMAL HIGH (ref 8–23)
CO2: 24 mmol/L (ref 22–32)
Calcium: 9 mg/dL (ref 8.9–10.3)
Chloride: 101 mmol/L (ref 98–111)
Creatinine, Ser: 1.42 mg/dL — ABNORMAL HIGH (ref 0.61–1.24)
GFR calc Af Amer: 58 mL/min — ABNORMAL LOW (ref >60)
GFR calc non Af Amer: 50 mL/min — ABNORMAL LOW (ref >60)
Glucose, Bld: 94 mg/dL (ref 70–99)
Potassium: 5.1 mmol/L (ref 3.5–5.1)
Sodium: 135 mmol/L (ref 135–145)

## 2019-10-12 LAB — CBC
HCT: 44.2 % (ref 39.0–52.0)
Hemoglobin: 13.2 g/dL (ref 13.0–17.0)
MCH: 29.7 pg (ref 26.0–34.0)
MCHC: 29.9 g/dL — ABNORMAL LOW (ref 30.0–36.0)
MCV: 99.3 fL (ref 80.0–100.0)
Platelets: 124 10*3/uL — ABNORMAL LOW (ref 150–400)
RBC: 4.45 MIL/uL (ref 4.22–5.81)
RDW: 13.4 % (ref 11.5–15.5)
WBC: 4.6 10*3/uL (ref 4.0–10.5)
nRBC: 0 % (ref 0.0–0.2)

## 2019-10-12 MED ORDER — OXYCODONE-ACETAMINOPHEN 5-325 MG PO TABS: 1.0000 | ORAL_TABLET | Freq: Four times a day (QID) | ORAL | 0 refills | Status: AC | PRN

## 2019-10-12 MED ORDER — MORPHINE SULFATE (PF) 4 MG/ML IV SOLN
4.0000 mg | Freq: Once | INTRAVENOUS | Status: DC
  Filled 2019-10-12: qty 1

## 2019-10-12 MED ORDER — HYDROCODONE-ACETAMINOPHEN 5-325 MG PO TABS
1.0000 | ORAL_TABLET | Freq: Once | ORAL | Status: AC
  Administered 2019-10-12: 1 via ORAL
  Filled 2019-10-12: qty 1

## 2019-10-12 MED ORDER — HYDROMORPHONE HCL 1 MG/ML IJ SOLN
0.5000 mg | Freq: Once | INTRAMUSCULAR | Status: AC
  Administered 2019-10-12: 0.5 mg via INTRAMUSCULAR
  Filled 2019-10-12: qty 1

## 2019-10-12 NOTE — ED Triage Notes (Signed)
PT to ER via EMS from [facility] with c/o left arm pain pain that is normal, but worsened acutely this AM.  Pt also c/o chest pain and left arm weakness.  Pt has hx of pinched nerve that causes pain in left arm.  Pt has hx of CVA with left facial droop, facility unsure if droop is worse.

## 2019-10-12 NOTE — ED Notes (Signed)
Pt had urinated in brief. Cleaned with wipes. New chuck and brief placed.

## 2019-10-12 NOTE — Discharge Instructions (Signed)
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  This probably happened over the course of a year or more, however it can cause pain especially when the arm is moved.  We have prescribed Percocet (oxycodone/acetaminophen) for pain.  This is a replacement for the hydrocodone that Calvin [name] is already taking, since it is somewhat stronger.  It should not be administered with the hydrocodone.  Calvin [name] should return to the ER for new, worsening, or persistent severe arm or chest pain, weakness or numbness, or any other new or worsening symptoms that are concerning.  He should follow-up with an orthopedist in the next few weeks.  A referral has been provided.

## 2019-10-12 NOTE — ED Provider Notes (Signed)
[facility] Emergency Department Provider Note ____________________________________________   First MD Initiated Contact with Patient 10/12/19 0805     (approximate)  I have reviewed the triage vital signs and the nursing notes.   HISTORY  Chief Complaint Arm Pain    HPI Calvin [name] is a 69 y.o. male with PMH as noted below including a CVA with left-sided deficit who presents with left arm and chest pain, acute onset this morning.  The patient reports chronic left arm pain but states that it acutely worsened since this morning.  He denies any new weakness but states it hurts to move it.  He denies any recent fall or injury to the arm.  EMS states that the facility was concerned that he may have worsened left-sided weakness and a possible worsened facial droop when compared to normal.  The patient denies any other acute left-sided symptoms.  Past Medical History:  Diagnosis Date  . Alcohol abuse    drinks on weekend  . Anemia   . Anxiety   . Arthritis   . Cancer ([place])    colon,throat  . COPD (chronic obstructive pulmonary disease) ([place])   . Coronary artery disease   . Depression   . Gout   . Hypertension   . Myocardial infarction ([place])   . Neuromuscular disorder ([place])   . Seizures ([place])    last 6 months ago  . Stroke [facility])    multiple  left side weakness  . Tremors of nervous system     Patient Active Problem List   Diagnosis Date Noted  . Muscle twitching 08/14/2019  . Acute UTI 08/03/2019  . Ileus ([place]) 08/03/2019  . Hypokalemia 08/03/2019  . QT prolongation 08/03/2019  . Ogilvie's syndrome   . Abdominal pain 04/03/2019  . Left-sided weakness 09/30/2017  . Seizures ([place]) 09/30/2017  . HTN (hypertension) 09/30/2017  . CAD (coronary artery disease) 09/30/2017  . COPD (chronic obstructive pulmonary disease) ([place]) 09/30/2017  . Depression with anxiety 09/30/2017    Past Surgical History:  Procedure Laterality Date  . CARPAL TUNNEL  RELEASE Left 10/19/2015   Procedure: CARPAL TUNNEL RELEASE;  Surgeon: Hessie Knows, MD;  Location: [facility] ORS;  Service: Orthopedics;  Laterality: Left;  . COLON SURGERY    . JOINT REPLACEMENT     left partial hip   . THROAT SURGERY  2013   cancer    Prior to Admission medications   Medication Sig Start Date End Date Taking? Authorizing Provider  acetaminophen (TYLENOL) 650 MG CR tablet Take 650 mg by mouth every 6 (six) hours.     [provider]  ALPRAZolam Duanne Moron) 0.5 MG tablet Take 2 tablets (1 mg total) by mouth 3 (three) times daily. 08/14/19   Dhungel, Flonnie Overman, MD  alum & mag hydroxide-simeth (MYLANTA) 200-200-20 MG/5ML suspension Take 30 mLs by mouth every 12 (twelve) hours as needed.     [provider]  aspirin 81 MG tablet Take 81 mg by mouth daily.    [provider]  bisacodyl (DULCOLAX) 10 MG suppository Place 10 mg rectally daily.    [provider]  Dextromethorphan-Benzocaine (CEPACOL SORE THROAT & COUGH) 5-7.5 MG LOZG Use as directed 1 lozenge in the mouth or throat every 2 (two) hours as needed (sore throat).    [provider]  docusate sodium (COLACE) 100 MG capsule Take 1 capsule (100 mg total) by mouth 2 (two) times daily. 09/20/19 09/19/20  Rudene Re, MD  ergocalciferol (VITAMIN D2) 50000  units capsule Take 50,000 Units by mouth every 30 (thirty) days.    [provider]  feeding supplement, ENSURE ENLIVE, (ENSURE ENLIVE) LIQD Take 237 mLs by mouth 3 (three) times daily between meals. 08/14/19   Dhungel, Flonnie Overman, MD  furosemide (LASIX) 40 MG tablet Take 60 mg by mouth daily.     [provider]  gabapentin (NEURONTIN) 400 MG capsule Take 2 capsules (800 mg total) by mouth 3 (three) times daily. 10/02/17   Max Sane, MD  HYDROcodone-acetaminophen (NORCO/VICODIN) 5-325 MG tablet Take 1 tablet by mouth every 6 (six) hours as needed for moderate pain or severe pain. 08/14/19   Dhungel, Flonnie Overman, MD   lactulose (CHRONULAC) 10 GM/15ML solution Take 20 g by mouth daily.    [provider]  levETIRAcetam (KEPPRA) 500 MG tablet Take 500 mg by mouth 2 (two) times daily.     [provider]  lisinopril (ZESTRIL) 5 MG tablet Take 1 tablet (5 mg total) by mouth daily. 08/14/19   Dhungel, Flonnie Overman, MD  metoprolol succinate (TOPROL-XL) 25 MG 24 hr tablet Take 25 mg by mouth daily.    [provider]  nicotine polacrilex (NICORETTE) 2 MG gum Take 2 mg by mouth every 4 (four) hours as needed for smoking cessation.    [provider]  nitroGLYCERIN (NITROSTAT) 0.4 MG SL tablet Place 0.4 mg under the tongue every 5 (five) minutes as needed for chest pain.    [provider]  nystatin (NYSTATIN) powder Apply topically 2 (two) times daily. Groin rash    [provider]  ondansetron (ZOFRAN) 4 MG tablet Take 4 mg by mouth every 8 (eight) hours as needed for nausea or vomiting.    [provider]  oxyCODONE-acetaminophen (PERCOCET) 5-325 MG tablet Take 1 tablet by mouth every 6 (six) hours as needed for up to 7 days for severe pain. 10/12/19 10/19/19  Arta Silence, MD  pantoprazole (PROTONIX) 20 MG tablet Take 20 mg by mouth daily.    [provider]  polyethylene glycol (MIRALAX / GLYCOLAX) packet Take 17 g by mouth daily.     [provider]  senna (SENOKOT) 8.6 MG TABS tablet Take 1 tablet (8.6 mg total) by mouth at bedtime. 09/20/19   Rudene Re, MD  sertraline (ZOLOFT) 25 MG tablet Take 25 mg by mouth daily.    [provider]  simvastatin (ZOCOR) 10 MG tablet Take 10 mg by mouth at bedtime.     [provider]  tiotropium (SPIRIVA) 18 MCG inhalation capsule Place 18 mcg into inhaler and inhale daily.    [provider]  trolamine salicylate (ASPERCREME) 10 % cream Apply 1 application topically 3 (three) times daily as needed. To neck     [provider]  Wheat Dextrin (BENEFIBER  DRINK MIX PO) Take 5 g by mouth daily.    [provider]    Allergies Patient has no known allergies.  Family History  Problem Relation Age of Onset  . Cancer Mother   . Hypertension Father   . Leukemia Brother     Social History Social History   Tobacco Use  . Smoking status: Current Every Day Smoker  . Smokeless tobacco: Never Used  Substance Use Topics  . Alcohol use: Yes    Comment: weekends  . Drug use: No    Review of Systems  Constitutional: No fever. Eyes: No redness. ENT: No sore throat. Cardiovascular: Positive for chest pain. Respiratory: Denies shortness of breath. Gastrointestinal: No  vomiting or diarrhea.  Genitourinary: Negative for flank pain. Musculoskeletal: Negative for back pain.  Positive for left arm pain. Skin: Negative for rash. Neurological: Negative for headache.   ____________________________________________   PHYSICAL EXAM:  VITAL SIGNS: ED Triage Vitals [10/12/19 0802]  Enc Vitals Group     BP 129/77     Pulse Rate 63     Resp 18     Temp 98 F (36.7 C)     Temp src      SpO2 98 %     Weight 202 lb 13.2 oz (92 kg)     Height 5\' 11"  (1.803 m)     Head Circumference      Peak Flow      Pain Score 10     Pain Loc      Pain Edu?      Excl. in [place]?     Constitutional: Alert and oriented.  Somewhat chronically weak appearing but in no acute distress. Eyes: Conjunctivae are normal.  EOMI.  PERRLA. Head: Atraumatic. Nose: No congestion/rhinnorhea. Mouth/Throat: Mucous membranes are moist.   Neck: Normal range of motion.  Cardiovascular: Normal rate, regular rhythm. Grossly normal heart sounds.  Good peripheral circulation. Respiratory: Normal respiratory effort.  No retractions. Lungs CTAB. Gastrointestinal: Soft and nontender. No distention.  Genitourinary: No flank tenderness. Musculoskeletal: No lower extremity edema.  Extremities warm and well perfused.  Pain on range of motion of the left arm especially at  the elbow and shoulder.  2+ radial pulse. Neurologic:  Normal speech and language.  Chronic left upper and lower extremity weakness.  No significant facial droop. Skin:  Skin is warm and dry. No rash noted. Psychiatric: Mood and affect are normal. Speech and behavior are normal.  ____________________________________________   LABS (all labs ordered are listed, but only abnormal results are displayed)  Labs Reviewed  BASIC METABOLIC PANEL - Abnormal; Notable for the following components:      Result Value   BUN 28 (*)    Creatinine, Ser 1.42 (*)    GFR calc non Af Amer 50 (*)    GFR calc Af Amer 58 (*)    All other components within normal limits  CBC - Abnormal; Notable for the following components:   MCHC 29.9 (*)    Platelets 124 (*)    All other components within normal limits  TROPONIN I (HIGH SENSITIVITY)  TROPONIN I (HIGH SENSITIVITY)   ____________________________________________  EKG  ED ECG REPORT I, Arta Silence, the attending physician, personally viewed and interpreted this ECG.  Date: 10/12/2019 EKG Time: 758 Rate: 70 Rhythm: normal sinus rhythm (incorrectly read by the machine as atrial fibrillation) QRS Axis: normal Intervals: normal ST/T Wave abnormalities: normal Narrative Interpretation: no evidence of acute ischemia  ____________________________________________  RADIOLOGY  CT head: No new infarct or other acute abnormality CXR: No acute findings in the chest.  Possible changes of avascular necrosis in the left humeral head when compared to x-ray from 2019. XR L shoulder: AVN involving the humeral head ____________________________________________   PROCEDURES  Procedure(s) performed: No  Procedures  Critical Care performed: No ____________________________________________   INITIAL IMPRESSION / ASSESSMENT AND PLAN / ED COURSE  Pertinent labs & imaging results that were available during my care of the patient were reviewed by me and  considered in my medical decision making (see chart for details).  69 year old male with PMH as noted below including CVA with left-sided weakness presents with apparent acute on chronic left arm pain as  well as some acute chest pain this morning.  Per EMS, the facility reported possible increased left-sided weakness although the patient denies any new weakness or numbness.  The patient was most recently seen in the ED last month with concern for possible small bowel obstruction but negative work-up and resolved symptoms.  Previously he was admitted in December for an ileus and new left-sided weakness and twitching.  MRI at that time did not show acute infarct.  On exam currently, the patient is relatively comfortable appearing.  His vital signs are normal.  He has left-sided weakness which she reports is his baseline, but no significant facial droop.  He does have pain on range of motion of the left arm, but no swelling, induration, or abnormal warmth, or other acute findings.  The left arm is vascularly intact with good distal pulses and cap refill.  EKG is nonischemic.  Overall I suspect exacerbation of chronic left arm pain.  Differential for the chest pain includes musculoskeletal pain, and nerve pain, musculoskeletal chest wall pain, or less likely ACS.  There is no clinical evidence of PE.  The patient does not have any significant new left-sided neurologic symptoms from what I am able to ascertain and so I have a low suspicion for acute CVA, however given the difficulty of exam given the patient's chronic left-sided weakness and relatively poor history, we will obtain a CT head.  In addition I will obtain labs and an x-ray for work-up of the chest pain.  ----------------------------------------- 3:08 PM on 10/12/2019 -----------------------------------------  CT head showed no acute findings.  Initial and repeat troponin are both negative.  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  I confirmed this on a dedicated shoulder x-ray.  This likely is strongly contributing to his acute on chronic pain in the left arm.  The patient had minimal relief of the pain with hydrocodone, so I gave morphine and then a dose of Dilaudid.  He now reports significant improvement in the pain to the left arm.  On reassessment, he is moving the distal left arm better and appears comfortable.  I consulted Dr. Gaspar Bidding from orthopedics regarding the avascular necrosis.  He recommends pain control and routine orthopedic follow-up.  There is no indication for other acute intervention especially given the patient's motor deficit on the left.  I counseled the patient on the results of the work-up.  At this time, given that there is no evidence of acute stroke or any cardiac etiology of the pain he is stable for discharge back to his facility.  He is normally on hydrocodone, so I have prescribed a short course of oxycodone for possible better pain relief until he can follow-up with orthopedics.  Return precautions given.  ____________________________________________   FINAL CLINICAL IMPRESSION(S) / ED DIAGNOSES  Final diagnoses:  Left arm pain      NEW MEDICATIONS STARTED DURING THIS VISIT:  New Prescriptions   OXYCODONE-ACETAMINOPHEN (PERCOCET) 5-325 MG TABLET    Take 1 tablet by mouth every 6 (six) hours as needed for up to 7 days for severe pain.     Note:  This document was prepared using Dragon voice recognition software and may include unintentional dictation errors.    Arta Silence, MD 10/12/19 [phone]

## 2019-10-12 NOTE — ED Notes (Signed)
Pt defecated and urinated in brief. Pt rolled side to side to be cleaned with wipes. Linen changed, new brief placed on pt. Pt moved up in bed. Sitting up watching tv at this time.

## 2021-10-02 ENCOUNTER — Emergency Department: Payer: Medicare Other

## 2021-10-02 ENCOUNTER — Other Ambulatory Visit: Payer: Self-pay

## 2021-10-02 ENCOUNTER — Encounter: Payer: Self-pay | Admitting: Emergency Medicine

## 2021-10-02 ENCOUNTER — Emergency Department
Admission: EM | Admit: 2021-10-02 | Discharge: 2021-10-02 | Disposition: A | Discharge status: Home / Self Care | Payer: Medicare Other | Attending: Emergency Medicine | Admitting: Emergency Medicine

## 2021-10-02 DIAGNOSIS — R531 Weakness: Secondary | ICD-10-CM | POA: Diagnosis present

## 2021-10-02 DIAGNOSIS — Z20822 Contact with and (suspected) exposure to covid-19: Secondary | ICD-10-CM | POA: Insufficient documentation

## 2021-10-02 DIAGNOSIS — I10 Essential (primary) hypertension: Secondary | ICD-10-CM | POA: Insufficient documentation

## 2021-10-02 DIAGNOSIS — R791 Abnormal coagulation profile: Secondary | ICD-10-CM | POA: Insufficient documentation

## 2021-10-02 DIAGNOSIS — R52 Pain, unspecified: Secondary | ICD-10-CM

## 2021-10-02 LAB — CBC WITH DIFFERENTIAL/PLATELET
Abs Immature Granulocytes: 0.02 K/uL (ref 0.00–0.07)
Basophils Absolute: 0 K/uL (ref 0.0–0.1)
Basophils Relative: 1 %
Eosinophils Absolute: 0 K/uL (ref 0.0–0.5)
Eosinophils Relative: 1 %
HCT: 40.8 % (ref 39.0–52.0)
Hemoglobin: 12.8 g/dL — ABNORMAL LOW (ref 13.0–17.0)
Immature Granulocytes: 1 %
Lymphocytes Relative: 21 %
Lymphs Abs: 0.9 K/uL (ref 0.7–4.0)
MCH: 30.8 pg (ref 26.0–34.0)
MCHC: 31.4 g/dL (ref 30.0–36.0)
MCV: 98.3 fL (ref 80.0–100.0)
Monocytes Absolute: 0.6 K/uL (ref 0.1–1.0)
Monocytes Relative: 15 %
Neutro Abs: 2.8 K/uL (ref 1.7–7.7)
Neutrophils Relative %: 61 %
Platelets: 170 K/uL (ref 150–400)
RBC: 4.15 MIL/uL — ABNORMAL LOW (ref 4.22–5.81)
RDW: 12.3 % (ref 11.5–15.5)
WBC: 4.4 K/uL (ref 4.0–10.5)
nRBC: 0 % (ref 0.0–0.2)

## 2021-10-02 LAB — COMPREHENSIVE METABOLIC PANEL
ALT: 30 U/L (ref 0–44)
AST: 29 U/L (ref 15–41)
Albumin: 4.2 g/dL (ref 3.5–5.0)
Alkaline Phosphatase: 67 U/L (ref 38–126)
Anion gap: 6 (ref 5–15)
BUN: 13 mg/dL (ref 8–23)
CO2: 27 mmol/L (ref 22–32)
Calcium: 9.2 mg/dL (ref 8.9–10.3)
Chloride: 102 mmol/L (ref 98–111)
Creatinine, Ser: 1.42 mg/dL — ABNORMAL HIGH (ref 0.61–1.24)
GFR, Estimated: 53 mL/min — ABNORMAL LOW (ref >60)
Glucose, Bld: 101 mg/dL — ABNORMAL HIGH (ref 70–99)
Potassium: 5.1 mmol/L (ref 3.5–5.1)
Sodium: 135 mmol/L (ref 135–145)
Total Bilirubin: 0.5 mg/dL (ref 0.3–1.2)
Total Protein: 7.7 g/dL (ref 6.5–8.1)

## 2021-10-02 LAB — TROPONIN I (HIGH SENSITIVITY)
Troponin I (High Sensitivity): 7 ng/L (ref <18)
Troponin I (High Sensitivity): 6 ng/L

## 2021-10-02 LAB — PROTIME-INR
INR: 1 (ref 0.8–1.2)
Prothrombin Time: 12.7 seconds (ref 11.4–15.2)

## 2021-10-02 LAB — RESP PANEL BY RT-PCR (FLU A&B, COVID) ARPGX2
Influenza A by PCR: NEGATIVE
Influenza B by PCR: NEGATIVE
SARS Coronavirus 2 by RT PCR: NEGATIVE

## 2021-10-02 LAB — APTT: aPTT: 35 seconds (ref 24–36)

## 2021-10-02 MED ORDER — SODIUM CHLORIDE 0.9 % IV BOLUS
500.0000 mL | Freq: Once | INTRAVENOUS | Status: AC
  Administered 2021-10-02: 500 mL via INTRAVENOUS

## 2021-10-02 MED ORDER — MORPHINE SULFATE (PF) 4 MG/ML IV SOLN
4.0000 mg | Freq: Once | INTRAVENOUS | Status: AC
  Administered 2021-10-02: 4 mg via INTRAVENOUS
  Filled 2021-10-02: qty 1

## 2021-10-02 MED ORDER — LIDOCAINE 5 % EX PTCH
1.0000 | MEDICATED_PATCH | Freq: Two times a day (BID) | CUTANEOUS | 0 refills | Status: DC
Start: 2021-10-02 — End: 2021-10-02

## 2021-10-02 MED ORDER — LIDOCAINE 5 % EX PTCH
1.0000 | MEDICATED_PATCH | CUTANEOUS | Status: DC
  Administered 2021-10-02: 1 via TRANSDERMAL
  Filled 2021-10-02: qty 1

## 2021-10-02 MED ORDER — SODIUM CHLORIDE 0.9 % IV SOLN
100.0000 mL/h | INTRAVENOUS | Status: DC
  Administered 2021-10-02: 100 mL/h via INTRAVENOUS

## 2021-10-02 MED ORDER — ACETAMINOPHEN 325 MG PO TABS
650.0000 mg | ORAL_TABLET | Freq: Once | ORAL | Status: AC
  Administered 2021-10-02: 650 mg via ORAL
  Filled 2021-10-02: qty 2

## 2021-10-02 MED ORDER — LIDOCAINE 5 % EX PTCH: 1.0000 | MEDICATED_PATCH | Freq: Two times a day (BID) | CUTANEOUS | 0 refills | Status: AC

## 2021-10-02 NOTE — ED Notes (Signed)
Pt taken to CT at this time.

## 2021-10-02 NOTE — ED Notes (Signed)
MD from facility called and stated "pt was refusing to take any PO pain medication from his orders". MD only sent pt to ER at pt request. MD Jari Pigg was given phone so she could talk to MD from facility.

## 2021-10-02 NOTE — ED Notes (Signed)
[facility] called for report and update

## 2021-10-02 NOTE — ED Provider Notes (Signed)
[facility] Provider Note    Event Date/Time   First MD Initiated Contact with Patient 10/02/21 [phone]     (approximate)   History   Weakness   Level 5 caveat: History is limited by the patient being a vague historian.   HPI  Calvin [name] is a 71 y.o. male with extensive chronic medical history which most notably includes a prior CVA with resulting left-sided weakness that is chronic and occasional seizures.  He lives at a local skilled nursing facility and was brought by EMS for evaluation of approximately 24 hours of left-sided pain that he says goes from the top of his head down through his body, his left arm, the left side, and down into his left leg.  He also said that his hand and arm on the left side are more weak and numb than usual.  He said that the pain is constant and severe.  Reportedly he told the staff about this issue after it started but they did not feel there is an acute issue going on until this morning when they sent him by EMS.  The patient denies chest pain, abdominal pain, nausea, vomiting.  He reports having a generalized headache.     Physical Exam   Triage Vital Signs: ED Triage Vitals  Enc Vitals Group     BP 10/02/21 0557 131/81     Pulse Rate 10/02/21 0557 71     Resp 10/02/21 0557 15     Temp 10/02/21 0557 97.8 F (36.6 C)     Temp Source 10/02/21 0557 Oral     SpO2 10/02/21 0552 94 %     Weight 10/02/21 0559 96.2 kg (212 lb 1.6 oz)     Height 10/02/21 0559 1.816 m (5' 11.5")     Head Circumference --      Peak Flow --      Pain Score 10/02/21 0558 10     Pain Loc --      Pain Edu? --      Excl. in [place]? --     Most recent vital signs: Vitals:   10/02/21 0557 10/02/21 0709  BP: 131/81 125/72  Pulse: 71 74  Resp: 15 15  Temp: 97.8 F (36.6 C)   SpO2: 95% 96%     General: Awake, agitated but no severe distress.  Appears chronically ill. CV:  Good peripheral perfusion.  Normal heart sounds. Resp:  Normal  effort.  Able to speak in full sentences.  No wheezes, rales, nor rhonchi. Abd:  Patient noted appears to have what I believe is an abdominal wall hernia that is soft and reducible.  His abdomen is not tender to palpation. Neuro:  The patient has well-documented left-sided weakness after prior CVA.  However he has what appears to be roughly equal grip strength and arm strength on left and right sides.  However his left leg seems weaker than the right which is apparently his baseline.  He is reporting decreased sensation from usual but can feel me touch him.  He has no obvious facial droop.  Pupils are pinpoint bilaterally.   ED Results / Procedures / Treatments   Labs (all labs ordered are listed, but only abnormal results are displayed) Labs Reviewed  COMPREHENSIVE METABOLIC PANEL - Abnormal; Notable for the following components:      Result Value   Glucose, Bld 101 (*)    Creatinine, Ser 1.42 (*)    GFR, Estimated 53 (*)  All other components within normal limits  CBC WITH DIFFERENTIAL/PLATELET - Abnormal; Notable for the following components:   RBC 4.15 (*)    Hemoglobin 12.8 (*)    All other components within normal limits  RESP PANEL BY RT-PCR (FLU A&B, COVID) ARPGX2  APTT  PROTIME-INR  URINE DRUG SCREEN, QUALITATIVE ([facility] ONLY)  URINALYSIS, ROUTINE W REFLEX MICROSCOPIC  CBC WITH DIFFERENTIAL/PLATELET  TROPONIN I (HIGH SENSITIVITY)     EKG  ED ECG REPORT I, Hinda Kehr, the attending physician, personally viewed and interpreted this ECG.  Date: 10/02/2021 EKG Time: 6:07 AM Rate: 76 Rhythm: Accelerated junctional rhythm QRS Axis: Left axis deviation Intervals: normal ST/T Wave abnormalities: Non-specific ST segment / T-wave changes, but no clear evidence of acute ischemia. Narrative Interpretation: no definitive evidence of acute ischemia; does not meet STEMI criteria.    RADIOLOGY I personally reviewed the patient's head CT and see no evidence of acute  intracranial abnormality.  The radiologist also does not note any evidence of acute CVA nor acute bleed.    PROCEDURES:  Critical Care performed: No  .1-3 Lead EKG Interpretation Performed by: Hinda Kehr, MD Authorized by: Hinda Kehr, MD     Interpretation: normal     ECG rate:  75   ECG rate assessment: normal     Rhythm: sinus rhythm     Ectopy: none     Conduction: normal     MEDICATIONS ORDERED IN ED: Medications  sodium chloride 0.9 % bolus 500 mL (0 mLs Intravenous Stopped 10/02/21 0728)    Followed by  0.9 %  sodium chloride infusion (100 mL/hr Intravenous New Bag/Given 10/02/21 0732)  lidocaine (LIDODERM) 5 % 1 patch (has no administration in time range)  acetaminophen (TYLENOL) tablet 650 mg (has no administration in time range)  morphine 4 MG/ML injection 4 mg (4 mg Intravenous Given 10/02/21 0730)     IMPRESSION / MDM / ASSESSMENT AND PLAN / ED COURSE  I reviewed the triage vital signs and the nursing notes.                              Differential diagnosis includes, but is not limited to, acute on chronic pain, nonspecific infection, CVA, intracranial bleed, muscle cramp, AAS, ACS.   The patient is on the cardiac monitor to evaluate for evidence of arrhythmia and/or significant heart rate changes.  Patient is without any obvious acute neurological deficits at this time.  His examination is difficult due to his difficulty with cooperation and being a vague historian, as well as his chronic medical conditions including well-documented neurological deficits.  I reviewed a discharge summary from the hospitalization in December 2020 (discharge summary was dated 08/14/2019) and they mention his left-sided weakness, hypertension, depression with anxiety, and QT for ligation at that time.  It does not sound as if his neurological symptoms have objectively progressed very much since that time, although they may be subjectively worse.  I ordered a noncontrast head CT  as a first step but he may benefit from additional imaging.  I also ordered comprehensive metabolic panel, CBC with differential, pro time INR, APTT, and COVID test.  These results are pending.  I personally reviewed his EKG and there is no evidence of ischemia.  Clinical Course as of 10/02/21 0740  Sun Oct 02, 2021  0630 CBC is within normal limits, comprehensive metabolic panel is within normal limits except for his creatinine of 1.42 which is  actually baseline. [CF]  [id number] Transferring ED care to Dr. Jari Pigg to follow-up on imaging and reassess the patient.  Unclear disposition at this point.  However I personally reviewed the patient's CT scan and see no evidence of acute abnormality, nor does the radiologist.  I ordered an MR brain to look for evidence of acute or subacute CVA in the setting of his chronic limitations. [CF]    Clinical Course User Index [CF] Hinda Kehr, MD     FINAL CLINICAL IMPRESSION(S) / ED DIAGNOSES   Final diagnoses:  Acute pain  Left-sided weakness     Rx / DC Orders   ED Discharge Orders     None        Note:  This document was prepared using Dragon voice recognition software and may include unintentional dictation errors.   Hinda Kehr, MD 10/02/21 [phone]

## 2021-10-02 NOTE — ED Notes (Signed)
RN to bedside to introduce self to pt. Pt awake and alert and in no distress and asking for pain meds.

## 2021-10-02 NOTE — ED Triage Notes (Signed)
Pt arrives via [facility] from [facility], C/O progressive left sided weakness and pain.  Pt presents AOx4 and NAD.

## 2021-10-02 NOTE — ED Provider Notes (Signed)
7:06 AM Assumed care for off going team.   Blood pressure 131/81, pulse 71, temperature 97.8 F (36.6 C), temperature source Oral, resp. rate 15, height 5' 11.5" (1.816 m), weight 96.2 kg, SpO2 95 %.  See their HPI for full report but in brief prior stroke with left sided weakness unclear to what degree... yesterday at 6AM pain on the left and worsening left sided weakness/numbness. Out of window for TPA/LVO. Morphine for pain. MRI pending   7:41 AM reevaluated patient who states that he has been having pain on the left side for 5 years.  He however its been worsening over the past 5 months and he states that his facility will not get him to see a doctor.  This does not sound like a dissection or ACS but I did get a chest x-ray, EKG, troponin to further evaluate.  He does have good radial pulses that are equal.  This sounds more like chronic pain.  I did get a call from the on-call provider at his facility [facility] who stated that they did not want patient come to the ER because his vitals were stable and that he has the same chronic pain for years and that he is seen by neurology for it.  We will proceed with MRI just to make sure that there is no new stroke but I suspect that if this is all negative they feel comfortable with patient going back to the facility  Patient is feeling much better after pain medication.  MRI without any evidence of acute stroke.  Given this we will discharge patient back to facility.       Vanessa Benavides, MD 10/02/21 1052

## 2021-10-02 NOTE — ED Notes (Signed)
Pt brother JOE called to check on him.

## 2021-10-02 NOTE — Discharge Instructions (Addendum)
Your work-up was reassuring without evidence of heart attack, your MRI was negative for an acute stroke.  I suspect this is most likely your chronic pain.  Return to the ER if you develop worsening symptoms or any other concerns    IMPRESSION: 1. No acute finding. 2. 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

## 2021-10-02 NOTE — ED Notes (Signed)
CALLED [facility]  (JAIME) TO CANCEL CODE STROKE PER FORBACH, MD

## 2021-10-16 ENCOUNTER — Emergency Department: Payer: Medicare Other

## 2021-10-16 ENCOUNTER — Inpatient Hospital Stay: Payer: Medicare Other

## 2021-10-16 ENCOUNTER — Inpatient Hospital Stay
Admission: EM | Admit: 2021-10-16 | Discharge: 2021-11-02 | DRG: 389 | Disposition: A | Discharge status: Skilled Nursing Facility | Payer: Medicare Other | Source: Skilled Nursing Facility | Attending: Obstetrics and Gynecology | Admitting: Obstetrics and Gynecology

## 2021-10-16 ENCOUNTER — Other Ambulatory Visit: Payer: Self-pay

## 2021-10-16 DIAGNOSIS — Z8249 Family history of ischemic heart disease and other diseases of the circulatory system: Secondary | ICD-10-CM | POA: Diagnosis not present

## 2021-10-16 DIAGNOSIS — M109 Gout, unspecified: Secondary | ICD-10-CM | POA: Diagnosis present

## 2021-10-16 DIAGNOSIS — I10 Essential (primary) hypertension: Secondary | ICD-10-CM | POA: Diagnosis not present

## 2021-10-16 DIAGNOSIS — K5981 Ogilvie syndrome: Secondary | ICD-10-CM | POA: Diagnosis present

## 2021-10-16 DIAGNOSIS — N3000 Acute cystitis without hematuria: Secondary | ICD-10-CM | POA: Diagnosis not present

## 2021-10-16 DIAGNOSIS — K219 Gastro-esophageal reflux disease without esophagitis: Secondary | ICD-10-CM | POA: Diagnosis present

## 2021-10-16 DIAGNOSIS — I252 Old myocardial infarction: Secondary | ICD-10-CM | POA: Diagnosis not present

## 2021-10-16 DIAGNOSIS — K56600 Partial intestinal obstruction, unspecified as to cause: Secondary | ICD-10-CM | POA: Diagnosis present

## 2021-10-16 DIAGNOSIS — I69354 Hemiplegia and 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 side: Secondary | ICD-10-CM | POA: Diagnosis not present

## 2021-10-16 DIAGNOSIS — G8929 Other chronic pain: Secondary | ICD-10-CM | POA: Diagnosis present

## 2021-10-16 DIAGNOSIS — Z20822 Contact with and (suspected) exposure to covid-19: Secondary | ICD-10-CM | POA: Diagnosis present

## 2021-10-16 DIAGNOSIS — N179 Acute kidney failure, unspecified: Secondary | ICD-10-CM

## 2021-10-16 DIAGNOSIS — Z720 Tobacco use: Secondary | ICD-10-CM | POA: Diagnosis present

## 2021-10-16 DIAGNOSIS — F1010 Alcohol abuse, uncomplicated: Secondary | ICD-10-CM | POA: Insufficient documentation

## 2021-10-16 DIAGNOSIS — N1831 Chronic kidney disease, stage 3a: Secondary | ICD-10-CM | POA: Diagnosis present

## 2021-10-16 DIAGNOSIS — K635 Polyp of colon: Secondary | ICD-10-CM | POA: Diagnosis present

## 2021-10-16 DIAGNOSIS — Z85850 Personal history of malignant neoplasm of thyroid: Secondary | ICD-10-CM

## 2021-10-16 DIAGNOSIS — F17200 Nicotine dependence, unspecified, uncomplicated: Secondary | ICD-10-CM | POA: Diagnosis present

## 2021-10-16 DIAGNOSIS — K567 Ileus, unspecified: Secondary | ICD-10-CM

## 2021-10-16 DIAGNOSIS — R109 Unspecified abdominal pain: Secondary | ICD-10-CM | POA: Diagnosis present

## 2021-10-16 DIAGNOSIS — Z7982 Long term (current) use of aspirin: Secondary | ICD-10-CM

## 2021-10-16 DIAGNOSIS — R569 Unspecified convulsions: Secondary | ICD-10-CM | POA: Diagnosis present

## 2021-10-16 DIAGNOSIS — E785 Hyperlipidemia, unspecified: Secondary | ICD-10-CM | POA: Diagnosis present

## 2021-10-16 DIAGNOSIS — A419 Sepsis, unspecified organism: Secondary | ICD-10-CM | POA: Diagnosis present

## 2021-10-16 DIAGNOSIS — F419 Anxiety disorder, unspecified: Secondary | ICD-10-CM | POA: Diagnosis present

## 2021-10-16 DIAGNOSIS — I2510 Atherosclerotic heart disease of native coronary artery without angina pectoris: Secondary | ICD-10-CM | POA: Diagnosis present

## 2021-10-16 DIAGNOSIS — I639 Cerebral infarction, unspecified: Secondary | ICD-10-CM | POA: Diagnosis not present

## 2021-10-16 DIAGNOSIS — F418 Other specified anxiety disorders: Secondary | ICD-10-CM | POA: Diagnosis not present

## 2021-10-16 DIAGNOSIS — J449 Chronic obstructive pulmonary disease, unspecified: Secondary | ICD-10-CM

## 2021-10-16 DIAGNOSIS — R197 Diarrhea, unspecified: Secondary | ICD-10-CM

## 2021-10-16 DIAGNOSIS — I2583 Coronary atherosclerosis due to lipid rich plaque: Secondary | ICD-10-CM | POA: Diagnosis not present

## 2021-10-16 DIAGNOSIS — I1310 Hypertensive heart and chronic kidney disease without heart failure, with stage 1 through stage 4 chronic kidney disease, or unspecified chronic kidney disease: Secondary | ICD-10-CM | POA: Diagnosis present

## 2021-10-16 DIAGNOSIS — Z85038 Personal history of other malignant neoplasm of large intestine: Secondary | ICD-10-CM

## 2021-10-16 DIAGNOSIS — Z66 Do not resuscitate: Secondary | ICD-10-CM | POA: Diagnosis present

## 2021-10-16 DIAGNOSIS — K5900 Constipation, unspecified: Secondary | ICD-10-CM

## 2021-10-16 DIAGNOSIS — K5939 Other megacolon: Secondary | ICD-10-CM | POA: Diagnosis present

## 2021-10-16 DIAGNOSIS — R140 Abdominal distension (gaseous): Secondary | ICD-10-CM | POA: Diagnosis not present

## 2021-10-16 DIAGNOSIS — R112 Nausea with vomiting, unspecified: Secondary | ICD-10-CM | POA: Insufficient documentation

## 2021-10-16 DIAGNOSIS — K56609 Unspecified intestinal obstruction, unspecified as to partial versus complete obstruction: Secondary | ICD-10-CM

## 2021-10-16 DIAGNOSIS — N390 Urinary tract infection, site not specified: Secondary | ICD-10-CM | POA: Diagnosis present

## 2021-10-16 DIAGNOSIS — G40909 Epilepsy, unspecified, not intractable, without status epilepticus: Secondary | ICD-10-CM

## 2021-10-16 DIAGNOSIS — Z79899 Other long term (current) drug therapy: Secondary | ICD-10-CM

## 2021-10-16 DIAGNOSIS — Z96642 Presence of left artificial hip joint: Secondary | ICD-10-CM | POA: Diagnosis present

## 2021-10-16 LAB — PROTIME-INR
INR: 1 (ref 0.8–1.2)
Prothrombin Time: 13.3 seconds (ref 11.4–15.2)

## 2021-10-16 LAB — URINALYSIS, COMPLETE (UACMP) WITH MICROSCOPIC
Bilirubin Urine: NEGATIVE
Glucose, UA: NEGATIVE mg/dL
Hgb urine dipstick: NEGATIVE
Ketones, ur: NEGATIVE mg/dL
Nitrite: NEGATIVE
Protein, ur: NEGATIVE mg/dL
Specific Gravity, Urine: 1.035 — ABNORMAL HIGH (ref 1.005–1.030)
pH: 5 (ref 5.0–8.0)

## 2021-10-16 LAB — COMPREHENSIVE METABOLIC PANEL
ALT: 20 U/L (ref 0–44)
AST: 20 U/L (ref 15–41)
Albumin: 4.3 g/dL (ref 3.5–5.0)
Alkaline Phosphatase: 67 U/L (ref 38–126)
Anion gap: 12 (ref 5–15)
BUN: 17 mg/dL (ref 8–23)
CO2: 31 mmol/L (ref 22–32)
Calcium: 9.9 mg/dL (ref 8.9–10.3)
Chloride: 95 mmol/L — ABNORMAL LOW (ref 98–111)
Creatinine, Ser: 1.32 mg/dL — ABNORMAL HIGH (ref 0.61–1.24)
GFR, Estimated: 57 mL/min — ABNORMAL LOW (ref >60)
Glucose, Bld: 108 mg/dL — ABNORMAL HIGH (ref 70–99)
Potassium: 3.5 mmol/L (ref 3.5–5.1)
Sodium: 138 mmol/L (ref 135–145)
Total Bilirubin: 0.6 mg/dL (ref 0.3–1.2)
Total Protein: 7.7 g/dL (ref 6.5–8.1)

## 2021-10-16 LAB — CBC WITH DIFFERENTIAL/PLATELET
Abs Immature Granulocytes: 0.02 10*3/uL (ref 0.00–0.07)
Basophils Absolute: 0 10*3/uL (ref 0.0–0.1)
Basophils Relative: 0 %
Eosinophils Absolute: 0 10*3/uL (ref 0.0–0.5)
Eosinophils Relative: 0 %
HCT: 45.7 % (ref 39.0–52.0)
Hemoglobin: 14.3 g/dL (ref 13.0–17.0)
Immature Granulocytes: 0 %
Lymphocytes Relative: 18 %
Lymphs Abs: 1.4 10*3/uL (ref 0.7–4.0)
MCH: 30.5 pg (ref 26.0–34.0)
MCHC: 31.3 g/dL (ref 30.0–36.0)
MCV: 97.4 fL (ref 80.0–100.0)
Monocytes Absolute: 0.5 10*3/uL (ref 0.1–1.0)
Monocytes Relative: 7 %
Neutro Abs: 5.6 10*3/uL (ref 1.7–7.7)
Neutrophils Relative %: 75 %
Platelets: 282 10*3/uL (ref 150–400)
RBC: 4.69 MIL/uL (ref 4.22–5.81)
RDW: 12.5 % (ref 11.5–15.5)
WBC: 7.6 10*3/uL (ref 4.0–10.5)
nRBC: 0.4 % — ABNORMAL HIGH (ref 0.0–0.2)

## 2021-10-16 LAB — GASTROINTESTINAL PANEL BY PCR, STOOL (REPLACES STOOL CULTURE)

## 2021-10-16 LAB — RESP PANEL BY RT-PCR (FLU A&B, COVID) ARPGX2
Influenza A by PCR: NEGATIVE
Influenza B by PCR: NEGATIVE
SARS Coronavirus 2 by RT PCR: NEGATIVE

## 2021-10-16 LAB — PROCALCITONIN: Procalcitonin: <0.1 ng/mL

## 2021-10-16 LAB — C DIFFICILE QUICK SCREEN W PCR REFLEX
C Diff antigen: NEGATIVE
C Diff interpretation: NOT DETECTED
C Diff toxin: NEGATIVE

## 2021-10-16 LAB — LIPASE, BLOOD: Lipase: 34 U/L (ref 11–51)

## 2021-10-16 LAB — APTT: aPTT: 33 seconds (ref 24–36)

## 2021-10-16 LAB — LACTIC ACID, PLASMA: Lactic Acid, Venous: 1.3 mmol/L (ref 0.5–1.9)

## 2021-10-16 LAB — BRAIN NATRIURETIC PEPTIDE: B Natriuretic Peptide: 46.4 pg/mL (ref 0.0–100.0)

## 2021-10-16 MED ORDER — SENNA 8.6 MG PO TABS: 1.0000 | ORAL_TABLET | Freq: Every evening | ORAL | Status: DC

## 2021-10-16 MED ORDER — SERTRALINE HCL 50 MG PO TABS
50.0000 mg | ORAL_TABLET | Freq: Every day | ORAL | Status: DC
  Administered 2021-10-20 – 2021-11-02 (×13): 50 mg via ORAL
  Filled 2021-10-16 (×13): qty 1

## 2021-10-16 MED ORDER — LORAZEPAM 2 MG/ML IJ SOLN
0.5000 mg | Freq: Four times a day (QID) | INTRAMUSCULAR | Status: DC | PRN
  Administered 2021-10-16 – 2021-10-22 (×6): 0.5 mg via INTRAVENOUS
  Filled 2021-10-16 (×4): qty 1

## 2021-10-16 MED ORDER — ALBUTEROL SULFATE (2.5 MG/3ML) 0.083% IN NEBU: 3.0000 mL | INHALATION_SOLUTION | RESPIRATORY_TRACT | Status: DC | PRN

## 2021-10-16 MED ORDER — BISACODYL 5 MG PO TBEC: 5.0000 mg | DELAYED_RELEASE_TABLET | Freq: Every day | ORAL | Status: DC

## 2021-10-16 MED ORDER — BISACODYL 10 MG RE SUPP
10.0000 mg | Freq: Every morning | RECTAL | Status: DC
  Filled 2021-10-16: qty 1

## 2021-10-16 MED ORDER — NITROGLYCERIN 0.4 MG SL SUBL
0.4000 mg | SUBLINGUAL_TABLET | SUBLINGUAL | Status: DC | PRN
  Administered 2021-10-18 – 2021-10-23 (×3): 0.4 mg via SUBLINGUAL
  Filled 2021-10-16 (×3): qty 1

## 2021-10-16 MED ORDER — NICOTINE 21 MG/24HR TD PT24
21.0000 mg | MEDICATED_PATCH | Freq: Every day | TRANSDERMAL | Status: DC
  Administered 2021-10-17 – 2021-11-02 (×16): 21 mg via TRANSDERMAL
  Filled 2021-10-16 (×17): qty 1

## 2021-10-16 MED ORDER — IOHEXOL 300 MG/ML  SOLN
80.0000 mL | Freq: Once | INTRAMUSCULAR | Status: AC | PRN
  Administered 2021-10-16: 80 mL via INTRAVENOUS

## 2021-10-16 MED ORDER — MENTHOL (TOPICAL ANALGESIC) 4 % EX GEL: 1.0000 "application " | Freq: Every day | CUTANEOUS | Status: DC

## 2021-10-16 MED ORDER — TIOTROPIUM BROMIDE MONOHYDRATE 18 MCG IN CAPS
18.0000 ug | ORAL_CAPSULE | Freq: Every day | RESPIRATORY_TRACT | Status: DC
  Administered 2021-10-17 – 2021-11-02 (×17): 18 ug via RESPIRATORY_TRACT
  Filled 2021-10-16 (×5): qty 5

## 2021-10-16 MED ORDER — ONDANSETRON HCL 4 MG/2ML IJ SOLN
4.0000 mg | Freq: Three times a day (TID) | INTRAMUSCULAR | Status: DC | PRN
  Administered 2021-10-16 – 2021-10-17 (×2): 4 mg via INTRAVENOUS
  Filled 2021-10-16 (×2): qty 2

## 2021-10-16 MED ORDER — LISINOPRIL 10 MG PO TABS
5.0000 mg | ORAL_TABLET | Freq: Every day | ORAL | Status: DC
  Administered 2021-10-20 – 2021-10-27 (×7): 5 mg via ORAL
  Filled 2021-10-16 (×8): qty 1

## 2021-10-16 MED ORDER — DM-GUAIFENESIN ER 30-600 MG PO TB12: 1.0000 | ORAL_TABLET | Freq: Two times a day (BID) | ORAL | Status: DC | PRN

## 2021-10-16 MED ORDER — TAMSULOSIN HCL 0.4 MG PO CAPS
0.4000 mg | ORAL_CAPSULE | Freq: Every evening | ORAL | Status: DC
  Administered 2021-10-19 – 2021-11-01 (×14): 0.4 mg via ORAL
  Filled 2021-10-16 (×15): qty 1

## 2021-10-16 MED ORDER — OMEGA-3-ACID ETHYL ESTERS 1 G PO CAPS
1000.0000 mg | ORAL_CAPSULE | Freq: Every day | ORAL | Status: DC
  Filled 2021-10-16: qty 1

## 2021-10-16 MED ORDER — BENEFIBER DRINK MIX PO PACK: PACK | Freq: Every day | ORAL | Status: DC

## 2021-10-16 MED ORDER — POLYETHYLENE GLYCOL 3350 17 G PO PACK: 17.0000 g | PACK | Freq: Every day | ORAL | Status: DC

## 2021-10-16 MED ORDER — LACTULOSE 10 GM/15ML PO SOLN: 20.0000 g | Freq: Every day | ORAL | Status: DC

## 2021-10-16 MED ORDER — METOCLOPRAMIDE HCL 5 MG/ML IJ SOLN
5.0000 mg | Freq: Three times a day (TID) | INTRAMUSCULAR | Status: DC
  Administered 2021-10-16: 5 mg via INTRAVENOUS
  Filled 2021-10-16: qty 2

## 2021-10-16 MED ORDER — MORPHINE SULFATE (PF) 4 MG/ML IV SOLN: 4.0000 mg | Freq: Once | INTRAVENOUS | Status: DC

## 2021-10-16 MED ORDER — LEVETIRACETAM IN NACL 1000 MG/100ML IV SOLN
1000.0000 mg | Freq: Two times a day (BID) | INTRAVENOUS | Status: DC
  Administered 2021-10-16 – 2021-10-28 (×24): 1000 mg via INTRAVENOUS
  Filled 2021-10-16 (×27): qty 100

## 2021-10-16 MED ORDER — LEVETIRACETAM 500 MG PO TABS: 1000.0000 mg | ORAL_TABLET | Freq: Two times a day (BID) | ORAL | Status: DC

## 2021-10-16 MED ORDER — CLINDAMYCIN PHOS-BENZOYL PEROX 1-5 % EX GEL: 1.0000 "application " | Freq: Every morning | CUTANEOUS | Status: DC

## 2021-10-16 MED ORDER — ONDANSETRON HCL 4 MG/2ML IJ SOLN
4.0000 mg | Freq: Once | INTRAMUSCULAR | Status: AC
  Administered 2021-10-16: 4 mg via INTRAVENOUS

## 2021-10-16 MED ORDER — SODIUM CHLORIDE 0.9 % IV SOLN: 1.0000 g | INTRAVENOUS | Status: DC

## 2021-10-16 MED ORDER — LORAZEPAM 2 MG/ML IJ SOLN
1.0000 mg | INTRAMUSCULAR | Status: DC | PRN
  Administered 2021-10-17: 1 mg via INTRAVENOUS
  Filled 2021-10-16 (×3): qty 1

## 2021-10-16 MED ORDER — LORAZEPAM 2 MG/ML IJ SOLN
0.5000 mg | Freq: Once | INTRAMUSCULAR | Status: AC
  Administered 2021-10-16: 0.5 mg via INTRAVENOUS
  Filled 2021-10-16: qty 1

## 2021-10-16 MED ORDER — B COMPLEX-C PO TABS
1.0000 | ORAL_TABLET | Freq: Every day | ORAL | Status: DC
  Administered 2021-10-20 – 2021-11-02 (×13): 1 via ORAL
  Filled 2021-10-16 (×18): qty 1

## 2021-10-16 MED ORDER — ASPIRIN 81 MG PO CHEW
81.0000 mg | CHEWABLE_TABLET | Freq: Every day | ORAL | Status: DC
  Administered 2021-10-20 – 2021-11-02 (×13): 81 mg via ORAL
  Filled 2021-10-16 (×13): qty 1

## 2021-10-16 MED ORDER — HYDRALAZINE HCL 20 MG/ML IJ SOLN: 5.0000 mg | INTRAMUSCULAR | Status: DC | PRN

## 2021-10-16 MED ORDER — HYDROCODONE-ACETAMINOPHEN 5-325 MG PO TABS: 1.0000 | ORAL_TABLET | Freq: Two times a day (BID) | ORAL | Status: DC

## 2021-10-16 MED ORDER — SIMVASTATIN 10 MG PO TABS
10.0000 mg | ORAL_TABLET | Freq: Every day | ORAL | Status: DC
  Administered 2021-10-18: 10 mg via ORAL
  Filled 2021-10-16 (×3): qty 1

## 2021-10-16 MED ORDER — SODIUM CHLORIDE 0.9 % IV BOLUS
1000.0000 mL | Freq: Once | INTRAVENOUS | Status: AC
  Administered 2021-10-16: 1000 mL via INTRAVENOUS

## 2021-10-16 MED ORDER — METOPROLOL SUCCINATE ER 50 MG PO TB24
25.0000 mg | ORAL_TABLET | Freq: Every day | ORAL | Status: DC
  Administered 2021-10-20 – 2021-10-24 (×5): 25 mg via ORAL
  Filled 2021-10-16 (×5): qty 1

## 2021-10-16 MED ORDER — ALUM & MAG HYDROXIDE-SIMETH 200-200-20 MG/5ML PO SUSP
30.0000 mL | Freq: Two times a day (BID) | ORAL | Status: DC | PRN
  Administered 2021-10-18 – 2021-10-23 (×2): 30 mL via ORAL
  Filled 2021-10-16 (×2): qty 30

## 2021-10-16 MED ORDER — MENTHOL 3 MG MT LOZG
1.0000 | LOZENGE | OROMUCOSAL | Status: DC | PRN
  Administered 2021-10-31: 3 mg via ORAL
  Filled 2021-10-16 (×2): qty 9

## 2021-10-16 MED ORDER — GABAPENTIN 100 MG PO CAPS: 100.0000 mg | ORAL_CAPSULE | Freq: Every morning | ORAL | Status: DC

## 2021-10-16 MED ORDER — OYSTER SHELL CALCIUM/D3 500-5 MG-MCG PO TABS
1.0000 | ORAL_TABLET | Freq: Two times a day (BID) | ORAL | Status: DC
  Administered 2021-10-18 – 2021-11-02 (×28): 1 via ORAL
  Filled 2021-10-16 (×29): qty 1

## 2021-10-16 MED ORDER — MORPHINE SULFATE (PF) 2 MG/ML IV SOLN
1.0000 mg | INTRAVENOUS | Status: DC | PRN
  Administered 2021-10-16 – 2021-10-17 (×3): 1 mg via INTRAVENOUS
  Filled 2021-10-16 (×3): qty 1

## 2021-10-16 MED ORDER — SODIUM CHLORIDE 0.9 % IV SOLN: INTRAVENOUS | Status: DC

## 2021-10-16 MED ORDER — SODIUM CHLORIDE 0.9 % IV SOLN: 1.0000 g | Freq: Once | INTRAVENOUS | Status: DC

## 2021-10-16 MED ORDER — SODIUM CHLORIDE 0.9 % IV SOLN
1.0000 g | Freq: Three times a day (TID) | INTRAVENOUS | Status: DC
  Administered 2021-10-16 – 2021-10-17 (×3): 1 g via INTRAVENOUS
  Filled 2021-10-16: qty 1
  Filled 2021-10-16: qty 20
  Filled 2021-10-16: qty 1
  Filled 2021-10-16 (×2): qty 20

## 2021-10-16 MED ORDER — NYSTATIN 100000 UNIT/GM EX POWD
1.0000 "application " | Freq: Two times a day (BID) | CUTANEOUS | Status: DC | PRN
  Filled 2021-10-16: qty 15

## 2021-10-16 MED ORDER — PANTOPRAZOLE SODIUM 20 MG PO TBEC
20.0000 mg | DELAYED_RELEASE_TABLET | Freq: Every morning | ORAL | Status: DC
  Filled 2021-10-16 (×2): qty 1

## 2021-10-16 MED ORDER — HEPARIN SODIUM (PORCINE) 5000 UNIT/ML IJ SOLN
5000.0000 [IU] | Freq: Three times a day (TID) | INTRAMUSCULAR | Status: DC
  Administered 2021-10-16 – 2021-11-02 (×52): 5000 [IU] via SUBCUTANEOUS
  Filled 2021-10-16 (×52): qty 1

## 2021-10-16 MED ORDER — GABAPENTIN 400 MG PO CAPS
800.0000 mg | ORAL_CAPSULE | Freq: Three times a day (TID) | ORAL | Status: DC
  Administered 2021-10-18 – 2021-11-02 (×41): 800 mg via ORAL
  Filled 2021-10-16 (×43): qty 2

## 2021-10-16 MED ORDER — HYDROMORPHONE HCL 1 MG/ML IJ SOLN
1.0000 mg | Freq: Once | INTRAMUSCULAR | Status: AC
  Administered 2021-10-16: 1 mg via INTRAVENOUS
  Filled 2021-10-16: qty 1

## 2021-10-16 MED ORDER — ALPRAZOLAM 0.5 MG PO TABS
0.5000 mg | ORAL_TABLET | Freq: Two times a day (BID) | ORAL | Status: DC
  Administered 2021-10-18 – 2021-11-02 (×29): 0.5 mg via ORAL
  Filled 2021-10-16 (×29): qty 1

## 2021-10-16 MED ORDER — ACETAMINOPHEN 325 MG PO TABS
650.0000 mg | ORAL_TABLET | Freq: Four times a day (QID) | ORAL | Status: DC | PRN
  Administered 2021-10-18 – 2021-11-01 (×12): 650 mg via ORAL
  Filled 2021-10-16 (×12): qty 2

## 2021-10-16 NOTE — ED Notes (Signed)
Pt soiled brief with urine and feces. Pt cleaned and changed by with RN and Claiborne Billings, RN. Pt is clean and dry. Male purewick placed on pt at this time.

## 2021-10-16 NOTE — ED Notes (Signed)
Pt cleaned up, new brief put on

## 2021-10-16 NOTE — ED Notes (Signed)
Pt heard moaning from the hallway. This RN at bedside. Pt states that his entire L side hurts, pt has chronic L side pain. Pt also states he feels like his about vomit. Dr. Starleen Blue made aware. See new MAR orders.

## 2021-10-16 NOTE — ED Notes (Signed)
Pt continues to have episodes of emesis despite ondansetron.

## 2021-10-16 NOTE — Progress Notes (Signed)
Pt received in room 225. Alert and oriented and in no acute distress at this time. Vitals obtained, telemetry applied and called  in. Pt made comfortable. NG tube in with no order of amount of suction needed. Dr. Blaine Hamper notified. Skin assessment done. Noted just below coccyx is a small opening about 0.5 cm deep that appears to be healed. No intervention needed.

## 2021-10-16 NOTE — Consult Note (Addendum)
Subjective:   CC: Partial small bowel obstruction  HPI:  Calvin [name] is a 71 y.o. male who was consulted by [facility] for issue above.  Symptoms were first noted several hours ago. Pain is sharp, nonspecific abdominal pain.  Associated with nausea and vomiting, exacerbated by nothing specific  Patient's had episodes of diarrhea as well.  2 more episodes since admission to the ED.  Pain has since improved with medication.   Past Medical History:  has a past medical history of Alcohol abuse, Anemia, Anxiety, Arthritis, Cancer ([place]), COPD (chronic obstructive pulmonary disease) ([place]), Coronary artery disease, Depression, Gout, Hypertension, Myocardial infarction [facility]), Neuromuscular disorder ([place]), Seizures ([place]), Stroke ([place]), and Tremors of nervous system.  Past Surgical History:  Past Surgical History:  Procedure Laterality Date   CARPAL TUNNEL RELEASE Left 10/19/2015   Procedure: CARPAL TUNNEL RELEASE;  Surgeon: Hessie Knows, MD;  Location: [facility] ORS;  Service: Orthopedics;  Laterality: Left;   COLON SURGERY     JOINT REPLACEMENT     left partial hip    THROAT SURGERY  2013   cancer    Family History: family history includes Cancer in his mother; Hypertension in his father; Leukemia in his brother.  Social History:  reports that he has been smoking. He has never used smokeless tobacco. He reports current alcohol use. He reports that he does not use drugs.  Current Medications:  Prior to Admission medications   Medication Sig Start Date End Date Taking? Authorizing Provider  acetaminophen (TYLENOL) 325 MG tablet Take 650 mg by mouth every 6 (six) hours.    [provider]  ALPRAZolam Duanne Moron) 0.25 MG tablet Take 0.25 mg by mouth daily.    [provider]  ALPRAZolam Duanne Moron) 0.5 MG tablet Take 0.5 mg by mouth 2 (two) times daily.    [provider]  alum & mag hydroxide-simeth (MAALOX/MYLANTA) 200-200-20 MG/5ML suspension Take 30 mLs by mouth every 12 (twelve)  hours as needed for indigestion or heartburn.    [provider]  aspirin 81 MG chewable tablet Take 81 mg by mouth daily.    [provider]  B Complex-C (B-COMPLEX WITH VITAMIN C) tablet Take 1 tablet by mouth daily.    [provider]  bisacodyl (DULCOLAX) 10 MG suppository Place 10 mg rectally in the morning.    [provider]  bisacodyl (DULCOLAX) 5 MG EC tablet Take 5 mg by mouth daily.    [provider]  Calcium Carbonate-Vit D-Min (CALCIUM 600+D PLUS MINERALS) 600-400 MG-UNIT TABS Take 1 tablet by mouth 2 (two) times daily.    [provider]  Dextromethorphan-Benzocaine (CEPACOL SORE THROAT & COUGH) 5-7.5 MG LOZG Use as directed 1 lozenge in the mouth or throat every 3 (three) hours as needed (sore throat).    [provider]  furosemide (LASIX) 20 MG tablet Take 60 mg by mouth daily.     [provider]  gabapentin (NEURONTIN) 100 MG capsule Take 100 mg by mouth in the morning. (Take only with morning 800mg  dose to equal 900mg  total)    [provider]  gabapentin (NEURONTIN) 400 MG capsule Take 2 capsules (800 mg total) by mouth 3 (three) times daily. 10/02/17   Max Sane, MD  guaiFENesin (MUCINEX) 600 MG 12 hr tablet Take 600 mg by mouth 2 (two) times daily.    [provider]  HYDROcodone-acetaminophen (NORCO/VICODIN) 5-325 MG tablet Take 1 tablet by mouth every 12 (twelve) hours.    [provider]  lactulose (CHRONULAC) 10 GM/15ML solution Take 20 g by mouth daily.    [provider]  levETIRAcetam (KEPPRA) 500 MG tablet Take 1,000 mg by mouth 2 (two) times daily.    [provider]  lisinopril (ZESTRIL) 5 MG tablet Take 1 tablet (5 mg total) by mouth daily. 08/14/19   Dhungel, Flonnie Overman, MD  Menthol, Topical Analgesic, (BIOFREEZE) 4 % GEL Apply 1 application topically daily. (Apply to left side of face and body)    [provider]  metoprolol succinate  (TOPROL-XL) 25 MG 24 hr tablet Take 25 mg by mouth daily.    [provider]  nitroGLYCERIN (NITROSTAT) 0.4 MG SL tablet Place 0.4 mg under the tongue every 5 (five) minutes as needed for chest pain.    [provider]  nystatin (MYCOSTATIN/NYSTOP) powder Apply 1 application topically 2 (two) times daily as needed (groin rash).    [provider]  Omega-3 1000 MG CAPS Take 1,000 mg by mouth daily.    [provider]  pantoprazole (PROTONIX) 20 MG tablet Take 20 mg by mouth in the morning.    [provider]  polyethylene glycol (MIRALAX / GLYCOLAX) packet Take 17 g by mouth daily.     [provider]  senna (SENOKOT) 8.6 MG TABS tablet Take 1 tablet (8.6 mg total) by mouth at bedtime. Patient taking differently: Take 1 tablet by mouth every evening. 09/20/19   Rudene Re, MD  sertraline (ZOLOFT) 50 MG tablet Take 50 mg by mouth daily.    [provider]  simvastatin (ZOCOR) 10 MG tablet Take 10 mg by mouth at bedtime.     [provider]  tamsulosin (FLOMAX) 0.4 MG CAPS capsule Take 0.4 mg by mouth every evening.    [provider]  tiotropium (SPIRIVA) 18 MCG inhalation capsule Place 18 mcg into inhaler and inhale daily.    [provider]  Wheat Dextrin (BENEFIBER DRINK MIX PO) Take 5 g by mouth daily.    [provider]    Allergies:  Allergies as of 10/16/2021   (No Known Allergies)    ROS:  General: Denies weight loss, weight gain, fatigue, fevers, chills, and night sweats. Eyes: Denies blurry vision, double vision, eye pain, itchy eyes, and tearing. Ears: Denies hearing loss, earache, and ringing in ears. Nose: Denies sinus pain, congestion, infections, runny nose, and nosebleeds. Mouth/throat: Denies hoarseness, sore throat, bleeding gums, and difficulty swallowing. Heart: Denies chest pain, palpitations, racing heart, irregular heartbeat, leg pain or swelling, and decreased  activity tolerance. Respiratory: Denies breathing difficulty, shortness of breath, wheezing, cough, and sputum. GI: Denies change in appetite, heartburn, nausea, vomiting, constipation, diarrhea, and blood in stool. GU: Denies difficulty urinating, pain with urinating, urgency, frequency, blood in urine. Musculoskeletal: Denies joint stiffness, pain, swelling, muscle weakness. Skin: Denies rash, itching, mass, tumors, sores, and boils Neurologic: Denies headache, fainting, dizziness, seizures, numbness, and tingling. Psychiatric: Denies depression, anxiety, difficulty sleeping, and memory loss. Endocrine: Denies heat or cold intolerance, and increased thirst or urination. Blood/lymph: Denies easy bruising, easy bruising, and swollen glands     Objective:     BP (!) 141/85    Pulse 86    Temp (!) 100.4 F (38 C) (Rectal)    Resp 19    SpO2 95%   Constitutional :  alert, cooperative, appears stated age, and no distress  Lymphatics/Throat:  no asymmetry, masses, or scars  Respiratory:  clear to auscultation bilaterally  Cardiovascular:  regular rate and rhythm  Gastrointestinal: Soft, but distended, minimal tenderness to palpation in right lower and upper left quadrant .   Musculoskeletal: Steady movement  Skin: Cool and moist,   Psychiatric: Normal affect, non-agitated, not confused       LABS:  CMP Latest Ref Rng & Units 10/16/2021 10/02/2021 10/12/2019  Glucose 70 - 99 mg/dL 108(H) 101(H) 94  BUN 8 - 23 mg/dL 17 13 28(H)  Creatinine 0.61 - 1.24 mg/dL 1.32(H) 1.42(H) 1.42(H)  Sodium 135 - 145 mmol/L 138 135 135  Potassium 3.5 - 5.1 mmol/L 3.5 5.1 5.1  Chloride 98 - 111 mmol/L 95(L) 102 101  CO2 22 - 32 mmol/L 31 27 24   Calcium 8.9 - 10.3 mg/dL 9.9 9.2 9.0  Total Protein 6.5 - 8.1 g/dL 7.7 7.7 -  Total Bilirubin 0.3 - 1.2 mg/dL 0.6 0.5 -  Alkaline Phos 38 - 126 U/L 67 67 -  AST 15 - 41 U/L 20 29 -  ALT 0 - 44 U/L 20 30 -   CBC Latest Ref Rng & Units 10/16/2021 10/02/2021 10/12/2019   WBC 4.0 - 10.5 K/uL 7.6 4.4 4.6  Hemoglobin 13.0 - 17.0 g/dL 14.3 12.8(L) 13.2  Hematocrit 39.0 - 52.0 % 45.7 40.8 44.2  Platelets 150 - 400 K/uL 282 170 124(L)    RADS: CLINICAL DATA:  Acute, nonlocalized abdominal pain   EXAM: CT ABDOMEN AND PELVIS WITH CONTRAST   TECHNIQUE: Multidetector CT imaging of the abdomen and pelvis was performed using the standard protocol following bolus administration of intravenous contrast.   RADIATION DOSE REDUCTION: This exam was performed according to the departmental dose-optimization program which includes automated exposure control, adjustment of the mA and/or kV according to patient size and/or use of iterative reconstruction technique.   CONTRAST:  85mL OMNIPAQUE IOHEXOL 300 MG/ML  SOLN   COMPARISON:  09/20/2019   FINDINGS: Lower chest: Elevated right diaphragm with scarring or atelectasis at the base. Coronary atherosclerosis.   Hepatobiliary: No focal liver abnormality.No evidence of biliary obstruction or stone.   Pancreas: Unremarkable.   Spleen: Unremarkable.   Adrenals/Urinary Tract: Negative adrenals. Patchy bilateral renal cortical scarring. No hydronephrosis. Unremarkable bladder.   Stomach/Bowel: Diffuse gas and fluid distention of the colon, also seen on prior. Changes of sigmoidectomy. Gas and fluid distended stomach and proximal small bowel without discrete transition point. No bowel wall thickening /inflammation.   Vascular/Lymphatic: Scattered atheromatous calcifications. Negative for mass or adenopathy. No mass or adenopathy.   Reproductive:No pathologic findings, partially obscured pelvis from streak artifact.   Other: No ascites or pneumoperitoneum.   Musculoskeletal: No acute abnormalities.  Left hip arthroplasty.   IMPRESSION: 1. Fluid distended stomach and proximal small bowel suggesting partial obstruction, although a discrete transition point is not seen. Suggest follow-up radiographs. 2.  Noncontiguous diffuse distention of colon with fluid levels, also seen on 2021 comparison, question risk factors for chronic diarrheal illness or ileus.     Electronically Signed   By: Jorje Guild M.D.   On: 10/16/2021 08:33   Assessment:   Partial small bowel obstruction concerns per CT report  Plan:    Patient history is not consistent with a bowel obstruction with multiple episodes of diarrhea.  Patient is more consistent with acute gastroenteritis or chronic GI motility issues as noted on previous admission.  Chart review notes patient was supposed to continue GI work-up as an outpatient but may not have followed through.  Recommend GI consultation to to complete work-up.  Since no signs of obstruction, no surgical intervention  is needed at this time.  We will resume clears for now and continue to monitor. Hold all laxatives and stool softners in the meantime  Management of chronic disease otherwise per hospitalist team.

## 2021-10-16 NOTE — ED Provider Notes (Signed)
[facility] Provider Note    Event Date/Time   First MD Initiated Contact with Patient 10/16/21 [phone]     (approximate)   History   Abdominal Pain   Level 5 caveat: The patient is a vague historian and has an extensive chronic medical history of the makes providing history difficult.   HPI  Calvin [name] is a 71 y.o. male with extensive chronic medical history which most notably includes a prior CVA with resulting left-sided weakness and chronic pain.  He occasionally has seizures.  He lives at a local skilled nursing facility and was brought by EMS for evaluation of what they described as a cute onset abdominal pain with nausea, vomiting, and diarrhea.  He is not able to articulate any of this except to say that his abdomen hurts.  He also says he has pain in his tongue, his left arm, sometimes his left leg, and sometimes his right foot.  The nurse practitioner at the nursing facility called our charge nurse and explained that the pain in his extremities is chronic but his abdominal symptoms are new.  The patient is not able to provide any additional history.  He does speak but is slow to answer questions.     Physical Exam   Triage Vital Signs: ED Triage Vitals  Enc Vitals Group     BP 10/16/21 0547 129/83     Pulse Rate 10/16/21 0547 91     Resp 10/16/21 0547 18     Temp --      Temp src --      SpO2 10/16/21 0547 93 %     Weight --      Height --      Head Circumference --      Peak Flow --      Pain Score 10/16/21 0548 9     Pain Loc --      Pain Edu? --      Excl. in [place]? --     Most recent vital signs: Vitals:   10/16/21 0625 10/16/21 0704  BP:  120/80  Pulse:  91  Resp:  (!) 22  Temp: (!) 100.4 F (38 C)   SpO2:  96%     General: Awake, appears chronically ill, not in severe distress currently but does appear uncomfortable. CV:  Good peripheral perfusion.  Normal radial pulses. Resp:  Normal effort.  Lungs are clear to  auscultation. Abd:  The patient's abdomen appears somewhat distended and he cries out upon generalized palpation.  However he has no specific localized peritonitis and no rebound or guarding. Other:  The patient has some chronic deficits from his prior CVA but it is difficult to appreciate exactly what they are.  It seems to mostly involve left-sided deficits and some weakness.  According to the nurse practitioner at the facility, he has no new neurological deficits and is at his baseline.   ED Results / Procedures / Treatments   Labs (all labs ordered are listed, but only abnormal results are displayed) Labs pending at time of signout to Dr. Starleen Blue   EKG  ED ECG REPORT I, Hinda Kehr, the attending physician, personally viewed and interpreted this ECG.  Date: 10/16/2021 EKG Time: 6:24 AM Rate: 89 Rhythm: Sinus rhythm with first-degree AV block QRS Axis: normal Intervals: PR interval prolonged to 61 ms ST/T Wave abnormalities: Non-specific ST segment / T-wave changes, but no clear evidence of acute ischemia. Narrative Interpretation: no definitive evidence of  acute ischemia; does not meet STEMI criteria.    RADIOLOGY I personally reviewed the patient's chest x-ray and he has a chronically elevated right hemidiaphragm.  The radiologist feels that there is increasing signs of atelectasis particularly in the right lung.    PROCEDURES:  Critical Care performed: No  Procedures   MEDICATIONS ORDERED IN ED: Medications  HYDROmorphone (DILAUDID) injection 1 mg (1 mg Intravenous Given 10/16/21 0749)  ondansetron (ZOFRAN) injection 4 mg (4 mg Intravenous Given 10/16/21 0749)     IMPRESSION / MDM / ASSESSMENT AND PLAN / ED COURSE  I reviewed the triage vital signs and the nursing notes.                              Differential diagnosis includes, but is not limited to, sepsis, acute intra-abdominal infection, SBO/ileus, viral infection, mesenteric ischemia.  The patient  looks more ill than when I have seen him in the past and when I saw him a couple of weeks ago for left-sided pain he did not have the abdominal pain and distention.  He is tender to palpation.  I saw him as soon as he arrived and vital signs are being obtained.  He has a borderline heart rate of 91 and no hypoxemia.  No respiratory symptoms.  Blood pressure is within normal limits.  I requested that the nurses obtain a rectal temperature because I believe he may be febrile.  I personally reviewed the EKG and there is no evidence of ischemia.  I also reviewed the portable chest x-ray as I described above and there is no clear lobar pneumonia.  His symptoms seem to be mostly in the abdomen.  I ordered "possible sepsis" work-up including respiratory viral panel, CMP, CBC with differential, coagulation studies, lactic acid, and comprehensive metabolic panel.  I anticipate he will need a CT scan of the abdomen and pelvis once we verify his renal function.     Clinical Course as of 10/16/21 3646  Nancy Fetter Oct 16, 2021  0628 Temp(!): 100.4 F (38 C) rectal temperature [CF]  0715 Discussed case in person with Dr. Starleen Blue who will be assuming care of the patient in the ED.  Lab work was significantly delayed because IV access was not obtained.  IV team consult order has been placed.  Dr. Starleen Blue will follow up on the results and disposition the patient appropriately. [CF]    Clinical Course User Index [CF] Hinda Kehr, MD     FINAL CLINICAL IMPRESSION(S) / ED DIAGNOSES   Final diagnoses:  None     Rx / DC Orders   ED Discharge Orders     None        Note:  This document was prepared using Dragon voice recognition software and may include unintentional dictation errors.   Hinda Kehr, MD 10/16/21 [phone]

## 2021-10-16 NOTE — ED Notes (Signed)
Pt placed on 2L [place]. Pt was noted to has SpO2 of 87%. Pt placed on 2L [place] and O2 increased to 93% on 2L.

## 2021-10-16 NOTE — Assessment & Plan Note (Signed)
Seizure -Seizure precaution -When necessary Ativan for seizure -Continue Home keppra, but will change to IV 1000 mg bid

## 2021-10-16 NOTE — H&P (Addendum)
History and Physical    Calvin [name] VXB:[id number] DOB: 1951-03-30 DOA: 10/16/2021  Referring MD/NP/PA:   PCP: Pcp, No   Patient coming from:  The patient is coming from SNF  Chief Complaint: Nausea, vomiting, abdominal pain  HPI: Calvin [name] is a 71 y.o. male with medical history significant of Ogilvie's syndrome, ileus, hypertension, hyperlipidemia, COPD, stroke with mild left-sided weakness, GERD, gout, depression with anxiety, seizure, CAD, MI,  alcohol abuse, CKD stage IIIa, dCHF, colon cancer, thyroid cancer, who presents with nausea vomiting, abdominal pain.   Patient is a poor historian, history is limited. Pt states that has been having abdominal pain for 2 days, associated with nausea, nonbilious nonbloody vomiting several times.  He also has abdominal distention.  Per his daughter (I called her daughter by phone), she noticed patient's abdominal distention several days ago.  Patient states that he has diarrhea, but per nurse observation, patient's stools are formed in the ED.  Patient does not have chest pain, cough, shortness of breath.  Patient has fever 100.4 in ED.  Data Reviewed and ED Course: pt was found to have WBC 7.6, positive urinalysis (hazy appearance, moderate amount of leukocyte, few bacteria, WBC 21-50), negative COVID PCR, renal function at baseline, temperature 100.4, blood pressure 155/96, heart rate 91, RR 20, oxygen saturation 93-96% on room air.  Chest x-ray showed increased right perihilar atelectasis.  CT scan of abdomen/pelvis showed possible partial small bowel obstruction and ileus.  Patient is admitted to telemetry bed as inpatient.  Dr. Lysle Pearl of general surgery and Dr. Haig Prophet for GI are consulted.  CT-abd/pelvis: 1. Fluid distended stomach and proximal small bowel suggesting partial obstruction, although a discrete transition point is not seen. Suggest follow-up radiographs. 2. Noncontiguous diffuse distention of colon with fluid levels,  also seen on 2021 comparison, question risk factors for chronic diarrheal illness or ileus  EKG: I have personally reviewed.  Sinus rhythm, QTc 432, LAD, poor R wave progression, low voltage.   Review of Systems:   General: no fevers, chills, no body weight gain, has poor appetite, has fatigue HEENT: no blurry vision, hearing changes or sore throat Respiratory: no dyspnea, coughing, wheezing CV: no chest pain, no palpitations GI: has nausea, vomiting, abdominal pain, no diarrhea, constipation GU: no dysuria, burning on urination, increased urinary frequency, hematuria  Ext: no leg edema Neuro: has mild left sided weakness, no vision change or hearing loss Skin: no rash, no skin tear. MSK: No muscle spasm, no deformity, no limitation of range of movement in spin Heme: No easy bruising.  Travel history: No recent long distant travel.   Allergy: No Known Allergies  Past Medical History:  Diagnosis Date   Alcohol abuse    drinks on weekend   Anemia    Anxiety    Arthritis    Cancer ([place])    colon,throat   COPD (chronic obstructive pulmonary disease) ([place])    Coronary artery disease    Depression    Gout    Hypertension    Myocardial infarction [facility])    Neuromuscular disorder ([place])    Seizures ([place])    last 6 months ago   Stroke [facility])    multiple  left side weakness   Tremors of nervous system     Past Surgical History:  Procedure Laterality Date   CARPAL TUNNEL RELEASE Left 10/19/2015   Procedure: CARPAL TUNNEL RELEASE;  Surgeon: Hessie Knows, MD;  Location: [facility] ORS;  Service: Orthopedics;  Laterality: Left;   COLON  SURGERY     JOINT REPLACEMENT     left partial hip    THROAT SURGERY  2013   cancer    Social History:  reports that he has been smoking. He has never used smokeless tobacco. He reports current alcohol use. He reports that he does not use drugs.  Family History:  Family History  Problem Relation Age of Onset   Cancer Mother    Hypertension  Father    Leukemia Brother      Prior to Admission medications   Medication Sig Start Date End Date Taking? Authorizing Provider  acetaminophen (TYLENOL) 325 MG tablet Take 650 mg by mouth every 6 (six) hours.   Yes [provider]  ALPRAZolam (XANAX) 0.25 MG tablet Take 0.25 mg by mouth daily.   Yes [provider]  ALPRAZolam Duanne Moron) 0.5 MG tablet Take 0.5 mg by mouth 2 (two) times daily.   Yes [provider]  aspirin 81 MG chewable tablet Take 81 mg by mouth daily.   Yes [provider]  B Complex-C (B-COMPLEX WITH VITAMIN C) tablet Take 1 tablet by mouth daily.   Yes [provider]  bisacodyl (DULCOLAX) 10 MG suppository Place 10 mg rectally in the morning.   Yes [provider]  bisacodyl (DULCOLAX) 5 MG EC tablet Take 5 mg by mouth daily.   Yes [provider]  clindamycin-benzoyl peroxide (BENZACLIN) gel Apply 1 application topically every morning. 10/03/21  Yes [provider]  furosemide (LASIX) 20 MG tablet Take 60 mg by mouth daily.    Yes [provider]  gabapentin (NEURONTIN) 100 MG capsule Take 100 mg by mouth in the morning. (Take only with morning 800mg  dose to equal 900mg  total)   Yes [provider]  gabapentin (NEURONTIN) 400 MG capsule Take 2 capsules (800 mg total) by mouth 3 (three) times daily. 10/02/17  Yes Max Sane, MD  guaiFENesin (MUCINEX) 600 MG 12 hr tablet Take 600 mg by mouth 2 (two) times daily.   Yes [provider]  HYDROcodone-acetaminophen (NORCO/VICODIN) 5-325 MG tablet Take 1 tablet by mouth every 12 (twelve) hours.   Yes [provider]  lactulose (CHRONULAC) 10 GM/15ML solution Take 20 g by mouth daily.   Yes [provider]  levETIRAcetam (KEPPRA) 500 MG tablet Take 1,000 mg by mouth 2 (two) times daily.   Yes [provider]  Lidocaine 4 % PTCH Apply 1 patch topically at bedtime. Apply to left side of neck daily at bedtime for  5 days   Yes [provider]  Lidocaine HCl 4 % CREA Apply 1 application topically daily. Apply from left side of face, down to left side of body to left foot   Yes [provider]  lisinopril (ZESTRIL) 5 MG tablet Take 1 tablet (5 mg total) by mouth daily. 08/14/19  Yes Dhungel, Nishant, MD  Menthol, Topical Analgesic, (BIOFREEZE) 4 % GEL Apply 1 application topically daily. (Apply to left side of face and body)   Yes [provider]  metoprolol succinate (TOPROL-XL) 25 MG 24 hr tablet Take 25 mg by mouth daily.   Yes [provider]  Omega-3 1000 MG CAPS Take 1,000 mg by mouth daily.   Yes [provider]  ondansetron (ZOFRAN-ODT) 4 MG disintegrating tablet Take 4 mg by mouth every 8 (eight) hours as needed for nausea or vomiting.   Yes [provider]  pantoprazole (PROTONIX) 20 MG tablet Take 20 mg by mouth in the  morning.   Yes [provider]  senna (SENOKOT) 8.6 MG TABS tablet Take 1 tablet (8.6 mg total) by mouth at bedtime. Patient taking differently: Take 1 tablet by mouth every evening. 09/20/19  Yes Veronese, [place], MD  sertraline (ZOLOFT) 50 MG tablet Take 50 mg by mouth daily.   Yes [provider]  simvastatin (ZOCOR) 10 MG tablet Take 10 mg by mouth at bedtime.    Yes [provider]  tamsulosin (FLOMAX) 0.4 MG CAPS capsule Take 0.4 mg by mouth every evening.   Yes [provider]  tiotropium (SPIRIVA) 18 MCG inhalation capsule Place 18 mcg into inhaler and inhale daily.   Yes [provider]  Wheat Dextrin (BENEFIBER DRINK MIX PO) Take 5 g by mouth daily.   Yes [provider]  alum & mag hydroxide-simeth (MAALOX/MYLANTA) 200-200-20 MG/5ML suspension Take 30 mLs by mouth every 12 (twelve) hours as needed for indigestion or heartburn.    [provider]  B Complex Vitamins (VITAMIN B-COMPLEX) TABS Take 1 tablet by mouth daily. 06/09/21   [provider]   Calcium Carbonate-Vit D-Min (CALCIUM 600+D PLUS MINERALS) 600-400 MG-UNIT TABS Take 1 tablet by mouth 2 (two) times daily.    [provider]  Dextromethorphan-Benzocaine (CEPACOL SORE THROAT & COUGH) 5-7.5 MG LOZG Use as directed 1 lozenge in the mouth or throat every 3 (three) hours as needed (sore throat).    [provider]  nitroGLYCERIN (NITROSTAT) 0.4 MG SL tablet Place 0.4 mg under the tongue every 5 (five) minutes as needed for chest pain.    [provider]  nystatin (MYCOSTATIN/NYSTOP) powder Apply 1 application topically 2 (two) times daily as needed (groin rash).    [provider]  polyethylene glycol (MIRALAX / GLYCOLAX) packet Take 17 g by mouth daily.     [provider]    Physical Exam: Vitals:   10/16/21 1200 10/16/21 1212 10/16/21 1230 10/16/21 1400  BP: (!) 150/95  (!) 141/79 (!) 150/94  Pulse: 81  76 69  Resp: 19  20   Temp:      TempSrc:      SpO2: 91%  (!) 89% 100%  Weight:  97.5 kg     General: Not in acute distress HEENT:       Eyes: PERRL, EOMI, no scleral icterus.       ENT: No discharge from the ears and nose, no pharynx injection, no tonsillar enlargement.        Neck: No JVD, no bruit, no mass felt. Heme: No neck lymph node enlargement. Cardiac: S1/S2, RRR, No murmurs, No gallops or rubs. Respiratory: No rales, wheezing, rhonchi or rubs. GI: Distended, diffuse tenderness.  No rebound pain, no organomegaly, BS present. GU: No hematuria Ext: No pitting leg edema bilaterally. 1+DP/PT pulse bilaterally. Musculoskeletal: No joint deformities, No joint redness or warmth, no limitation of ROM in spin. Skin: No rashes.  Neuro: Alert, oriented X3, cranial nerves II-XII grossly intact, has minimal left-sided weakness Psych: Patient is not psychotic, no suicidal or hemocidal ideation.  Labs on Admission: I have personally reviewed following labs and imaging studies  CBC: Recent Labs  Lab 10/16/21 0700  WBC 7.6   NEUTROABS 5.6  HGB 14.3  HCT 45.7  MCV 97.4  PLT 144   Basic Metabolic Panel: Recent Labs  Lab 10/16/21 0700  NA 138  K 3.5  CL 95*  CO2 31  GLUCOSE 108*  BUN 17  CREATININE 1.32*  CALCIUM 9.9  GFR: Estimated Creatinine Clearance: 60.7 mL/min (A) (by C-G formula based on SCr of 1.32 mg/dL (H)). Liver Function Tests: Recent Labs  Lab 10/16/21 0700  AST 20  ALT 20  ALKPHOS 67  BILITOT 0.6  PROT 7.7  ALBUMIN 4.3   Recent Labs  Lab 10/16/21 0700  LIPASE 34   No results for input(s): AMMONIA in the last 168 hours. Coagulation Profile: Recent Labs  Lab 10/16/21 0700  INR 1.0   Cardiac Enzymes: No results for input(s): CKTOTAL, CKMB, CKMBINDEX, TROPONINI in the last 168 hours. BNP (last 3 results) No results for input(s): PROBNP in the last 8760 hours. HbA1C: No results for input(s): HGBA1C in the last 72 hours. CBG: No results for input(s): GLUCAP in the last 168 hours. Lipid Profile: No results for input(s): CHOL, HDL, LDLCALC, TRIG, CHOLHDL, LDLDIRECT in the last 72 hours. Thyroid Function Tests: No results for input(s): TSH, T4TOTAL, FREET4, T3FREE, THYROIDAB in the last 72 hours. Anemia Panel: No results for input(s): VITAMINB12, FOLATE, FERRITIN, TIBC, IRON, RETICCTPCT in the last 72 hours. Urine analysis:    Component Value Date/Time   COLORURINE YELLOW (A) 10/16/2021 0700   APPEARANCEUR HAZY (A) 10/16/2021 0700   APPEARANCEUR Clear 08/14/2014 0933   LABSPEC 1.035 (H) 10/16/2021 0700   LABSPEC 1.015 08/14/2014 0933   PHURINE 5.0 10/16/2021 0700   GLUCOSEU NEGATIVE 10/16/2021 0700   GLUCOSEU Negative 08/14/2014 0933   HGBUR NEGATIVE 10/16/2021 0700   BILIRUBINUR NEGATIVE 10/16/2021 0700   BILIRUBINUR Negative 08/14/2014 0933   KETONESUR NEGATIVE 10/16/2021 0700   PROTEINUR NEGATIVE 10/16/2021 0700   UROBILINOGEN 0.2 06/12/2010 0103   NITRITE NEGATIVE 10/16/2021 0700   LEUKOCYTESUR MODERATE (A) 10/16/2021 0700   LEUKOCYTESUR Negative  08/14/2014 0933   Sepsis Labs: @LABRCNTIP (procalcitonin:4,lacticidven:4) ) Recent Results (from the past 240 hour(s))  Resp Panel by RT-PCR (Flu A&B, Covid) Nasopharyngeal Swab     Status: None   Collection Time: 10/16/21  6:43 AM   Specimen: Nasopharyngeal Swab; Nasopharyngeal(NP) swabs in vial transport medium  Result Value Ref Range Status   SARS Coronavirus 2 by RT PCR NEGATIVE NEGATIVE Final    Comment: (NOTE) SARS-CoV-2 target nucleic acids are NOT DETECTED.  The SARS-CoV-2 RNA is generally detectable in upper respiratory specimens during the acute phase of infection. The lowest concentration of SARS-CoV-2 viral copies this assay can detect is 138 copies/mL. A negative result does not preclude SARS-Cov-2 infection and should not be used as the sole basis for treatment or other patient management decisions. A negative result may occur with  improper specimen collection/handling, submission of specimen other than nasopharyngeal swab, presence of viral mutation(s) within the areas targeted by this assay, and inadequate number of viral copies(<138 copies/mL). A negative result must be combined with clinical observations, patient history, and epidemiological information. The expected result is Negative.  Fact Sheet for Patients:  EntrepreneurPulse.com.au  Fact Sheet for Healthcare Providers:  IncredibleEmployment.be  This test is no t yet approved or cleared by the [place] FDA and  has been authorized for detection and/or diagnosis of SARS-CoV-2 by FDA under an Emergency Use Authorization (EUA). This EUA will remain  in effect (meaning this test can be used) for the duration of the COVID-19 declaration under Section 564(b)(1) of the Act, 21 U.S.C.section 360bbb-3(b)(1), unless the authorization is terminated  or revoked sooner.       Influenza A by PCR NEGATIVE NEGATIVE Final   Influenza B by PCR NEGATIVE NEGATIVE Final     Comment: (NOTE) The Xpert  Xpress SARS-CoV-2/FLU/RSV plus assay is intended as an aid in the diagnosis of influenza from Nasopharyngeal swab specimens and should not be used as a sole basis for treatment. Nasal washings and aspirates are unacceptable for Xpert Xpress SARS-CoV-2/FLU/RSV testing.  Fact Sheet for Patients: EntrepreneurPulse.com.au  Fact Sheet for Healthcare Providers: IncredibleEmployment.be  This test is not yet approved or cleared by the [place] FDA and has been authorized for detection and/or diagnosis of SARS-CoV-2 by FDA under an Emergency Use Authorization (EUA). This EUA will remain in effect (meaning this test can be used) for the duration of the COVID-19 declaration under Section 564(b)(1) of the Act, 21 U.S.C. section 360bbb-3(b)(1), unless the authorization is terminated or revoked.  Performed at [facility], [address] [place], [place] [address]      Radiological Exams on Admission: CT ABDOMEN PELVIS W CONTRAST  Result Date: 10/16/2021 CLINICAL DATA:  Acute, nonlocalized abdominal pain EXAM: CT ABDOMEN AND PELVIS WITH CONTRAST TECHNIQUE: Multidetector CT imaging of the abdomen and pelvis was performed using the standard protocol following bolus administration of intravenous contrast. RADIATION DOSE REDUCTION: This exam was performed according to the departmental dose-optimization program which includes automated exposure control, adjustment of the mA and/or kV according to patient size and/or use of iterative reconstruction technique. CONTRAST:  28mL OMNIPAQUE IOHEXOL 300 MG/ML  SOLN COMPARISON:  09/20/2019 FINDINGS: Lower chest: Elevated right diaphragm with scarring or atelectasis at the base. Coronary atherosclerosis. Hepatobiliary: No focal liver abnormality.No evidence of biliary obstruction or stone. Pancreas: Unremarkable. Spleen: Unremarkable. Adrenals/Urinary Tract: Negative adrenals. Patchy  bilateral renal cortical scarring. No hydronephrosis. Unremarkable bladder. Stomach/Bowel: Diffuse gas and fluid distention of the colon, also seen on prior. Changes of sigmoidectomy. Gas and fluid distended stomach and proximal small bowel without discrete transition point. No bowel wall thickening /inflammation. Vascular/Lymphatic: Scattered atheromatous calcifications. Negative for mass or adenopathy. No mass or adenopathy. Reproductive:No pathologic findings, partially obscured pelvis from streak artifact. Other: No ascites or pneumoperitoneum. Musculoskeletal: No acute abnormalities.  Left hip arthroplasty. IMPRESSION: 1. Fluid distended stomach and proximal small bowel suggesting partial obstruction, although a discrete transition point is not seen. Suggest follow-up radiographs. 2. Noncontiguous diffuse distention of colon with fluid levels, also seen on 2021 comparison, question risk factors for chronic diarrheal illness or ileus. Electronically Signed   By: Jorje Guild M.D.   On: 10/16/2021 08:33   DG Chest Port 1 View  Result Date: 10/16/2021 CLINICAL DATA:  Sepsis workup. EXAM: PORTABLE CHEST 1 VIEW COMPARISON:  Portable chest 10/02/2021. FINDINGS: There are multiple overlying monitor wires. Stable mediastinum with aortic calcification and atherosclerosis. No vascular congestion is seen. Heart is slightly enlarged. Visualized lung fields are clear apart from right perihilar increased linear atelectasis. There is limited visualization of the right lower lung field due to again noted elevated right hemidiaphragm. Thoracic cage is intact. IMPRESSION: Increased right perihilar linear atelectasis with no evidence of acute chest disease or further interval changes. Limited view of the right base due to chronic elevation of the right diaphragm. Electronically Signed   By: Telford Nab M.D.   On: 10/16/2021 06:10      Assessment/Plan Principal Problem:   Abdominal pain Active Problems:    Ogilvie's syndrome   UTI (urinary tract infection)   Sepsis (HCC)   CAD (coronary artery disease)   COPD (chronic obstructive pulmonary disease) (HCC)   Depression with anxiety   HLD (hyperlipidemia)   HTN (hypertension)   Seizures (HCC)   Stroke (HCC)   Chronic  kidney disease, stage 3a ([place])   Tobacco abuse  Abdominal pain: Patient has abdominal pain, abdominal distention, nausea, vomiting.  Patient is a poor historian, he reports diarrhea, but per nurse observation, patient's stool is formed in ED. CT scan showed possible partial small bowel obstruction and ileus.  Dr. Lysle Pearl of general surgery is consulted.  He did not think this is consistent with bowel obstruction.  He recommended GI consult.  Dr. Haig Prophet for GI is consulted.  On examination, patient has significant abdominal distention, continues to have nausea vomiting.  Will start NG tube.  -will admit to tele bed as inpt -start NG tube -As needed morphine for pain -Scheduled Reglan and as needed Zofran for nausea -N.p.o. -IV fluids: 1 L normal saline, then 75 cc/h -After NG tube is placed, will need to hold home oral medications  Ogilvie's syndrome: Patient is a taking multiple laxatives, including Dulcolax, lactulose, MiraLAX, Senokot.  Per nurse report, patient's stools are formed -Will continue home laxatives now, but will have to hold it after placement of NG tube. Then will continue suppository Dulcolax   Sepsis due to UTI (urinary tract infection): Meets criteria for sepsis with fever 100.4, tachycardia with heart rate 91 and tachypnea with RR 22.  Lactic acid is normal. 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 to cefazolin, pip/tazo) -Follow-up blood culture urine culture -will get Procalcitonin -IVF: 1L of NS bolus in ED, followed by 75 cc/h   CAD (coronary artery disease): no chest pain -ASA, Zocor  COPD (chronic obstructive pulmonary disease) ([place]): Stable -Bronchodilators  Depression  with anxiety -Continue home medications  HLD (hyperlipidemia) -zocor  HTN (hypertension) -IV hydralazine as needed -Continue lisinopril, metoprolol  Seizure -Seizure precaution -When necessary Ativan for seizure - change home oral Keppra to IV 1000 mg bid  History of Stroke (HCC) -ASA and zocor  Chronic kidney disease, stage 3a ([place]): stable -f/u by BMP  Tobacco abuse: -Nicotine patch          DVT ppx: SQ Heparin     Code Status: DNR (pt has DNR form with him)  Family Communication:   Yes, patient's daughter by phone  Disposition Plan:  Anticipate discharge back to previous environment, SNF  Consults called:  Dr. Lysle Pearl of general surgery and Dr. Haig Prophet for GI are consulted.  Admission status and Level of care: Telemetry Medical:     as inpt       Severity of Illness:  The appropriate patient status for this patient is INPATIENT. Inpatient status is judged to be reasonable and necessary in order to provide the required intensity of service to ensure the patient's safety. The patient's presenting symptoms, physical exam findings, and initial radiographic and laboratory data in the context of their chronic comorbidities is felt to place them at high risk for further clinical deterioration. Furthermore, it is not anticipated that the patient will be medically stable for discharge from the hospital within 2 midnights of admission.   * I certify that at the point of admission it is my clinical judgment that the patient will require inpatient hospital care spanning beyond 2 midnights from the point of admission due to high intensity of service, high risk for further deterioration and high frequency of surveillance required.*       Date of Service 10/16/2021    Ivor Costa [facility]   If 7PM-7AM, please contact night-coverage [email or web address] 10/16/2021, 2:53 PM

## 2021-10-16 NOTE — ED Notes (Signed)
NG Tube pulled back to 22" mark at the nare

## 2021-10-16 NOTE — ED Notes (Signed)
Patient transported to CT 

## 2021-10-16 NOTE — Consult Note (Signed)
Consultation  Referring Provider:    Hospitalist  Admit date: 2/12 Consult date      2/12   Reason for Consultation:     Abnormal imaging/Partial small bowel obstruction         HPI:   Calvin [name] is a 71 y.o. gentleman with multiple medical problems including COPD, chronic abdominal pain, CVA's, and history of colon cancer s/p sigmoidectomy here with imaging concerning for partial small bowel obstruction. 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 Patient is very poor historian and daughter did not know much of his history besides colon cancer history. Has not had colonoscopy in many years.On prior presentation a couple of years ago where he presented similarly an EGD/Colonoscopy was planned but patient did not drink prep and then prior to another attempt he had a CVA. Patient has limited mobility and is on chronic narcotics. CT scan also showed evidence of chronic diarrhea. Patient does state issues with constipation. He was extremely bloated when I saw him and had vomiting but after NG tube placement he improved. States having diarrhea episodes.  Past Medical History:  Diagnosis Date   Alcohol abuse    drinks on weekend   Anemia    Anxiety    Arthritis    Cancer ([place])    colon,throat   COPD (chronic obstructive pulmonary disease) ([place])    Coronary artery disease    Depression    Gout    Hypertension    Myocardial infarction [facility])    Neuromuscular disorder ([place])    Seizures ([place])    last 6 months ago   Stroke [facility])    multiple  left side weakness   Tremors of nervous system     Past Surgical History:  Procedure Laterality Date   CARPAL TUNNEL RELEASE Left 10/19/2015   Procedure: CARPAL TUNNEL RELEASE;  Surgeon: Hessie Knows, MD;  Location: [facility] ORS;  Service: Orthopedics;  Laterality: Left;   COLON SURGERY     JOINT REPLACEMENT     left partial hip    THROAT SURGERY  2013   cancer    Family History  Problem Relation Age of Onset   Cancer Mother    Hypertension Father     Leukemia Brother     Social History   Tobacco Use   Smoking status: Every Day   Smokeless tobacco: Never  Substance Use Topics   Alcohol use: Yes    Comment: weekends   Drug use: No    Prior to Admission medications   Medication Sig Start Date End Date Taking? Authorizing Provider  acetaminophen (TYLENOL) 325 MG tablet Take 650 mg by mouth every 6 (six) hours.   Yes [provider]  ALPRAZolam (XANAX) 0.25 MG tablet Take 0.25 mg by mouth daily.   Yes [provider]  ALPRAZolam Duanne Moron) 0.5 MG tablet Take 0.5 mg by mouth 2 (two) times daily.   Yes [provider]  aspirin 81 MG chewable tablet Take 81 mg by mouth daily.   Yes [provider]  B Complex-C (B-COMPLEX WITH VITAMIN C) tablet Take 1 tablet by mouth daily.   Yes [provider]  bisacodyl (DULCOLAX) 10 MG suppository Place 10 mg rectally in the morning.   Yes [provider]  bisacodyl (DULCOLAX) 5 MG EC tablet Take 5 mg by mouth daily.   Yes [provider]  clindamycin-benzoyl peroxide (BENZACLIN) gel Apply 1 application topically every morning. 10/03/21  Yes [provider]  furosemide (LASIX) 20 MG tablet  Take 60 mg by mouth daily.    Yes [provider]  gabapentin (NEURONTIN) 100 MG capsule Take 100 mg by mouth in the morning. (Take only with morning 800mg  dose to equal 900mg  total)   Yes [provider]  gabapentin (NEURONTIN) 400 MG capsule Take 2 capsules (800 mg total) by mouth 3 (three) times daily. 10/02/17  Yes Max Sane, MD  guaiFENesin (MUCINEX) 600 MG 12 hr tablet Take 600 mg by mouth 2 (two) times daily.   Yes [provider]  HYDROcodone-acetaminophen (NORCO/VICODIN) 5-325 MG tablet Take 1 tablet by mouth every 12 (twelve) hours.   Yes [provider]  lactulose (CHRONULAC) 10 GM/15ML solution Take 20 g by mouth daily.   Yes [provider]  levETIRAcetam (KEPPRA) 500 MG tablet Take 1,000 mg by  mouth 2 (two) times daily.   Yes [provider]  Lidocaine 4 % PTCH Apply 1 patch topically at bedtime. Apply to left side of neck daily at bedtime for 5 days   Yes [provider]  Lidocaine HCl 4 % CREA Apply 1 application topically daily. Apply from left side of face, down to left side of body to left foot   Yes [provider]  lisinopril (ZESTRIL) 5 MG tablet Take 1 tablet (5 mg total) by mouth daily. 08/14/19  Yes Dhungel, Nishant, MD  Menthol, Topical Analgesic, (BIOFREEZE) 4 % GEL Apply 1 application topically daily. (Apply to left side of face and body)   Yes [provider]  metoprolol succinate (TOPROL-XL) 25 MG 24 hr tablet Take 25 mg by mouth daily.   Yes [provider]  Omega-3 1000 MG CAPS Take 1,000 mg by mouth daily.   Yes [provider]  ondansetron (ZOFRAN-ODT) 4 MG disintegrating tablet Take 4 mg by mouth every 8 (eight) hours as needed for nausea or vomiting.   Yes [provider]  pantoprazole (PROTONIX) 20 MG tablet Take 20 mg by mouth in the morning.   Yes [provider]  senna (SENOKOT) 8.6 MG TABS tablet Take 1 tablet (8.6 mg total) by mouth at bedtime. Patient taking differently: Take 1 tablet by mouth every evening. 09/20/19  Yes Veronese, [place], MD  sertraline (ZOLOFT) 50 MG tablet Take 50 mg by mouth daily.   Yes [provider]  simvastatin (ZOCOR) 10 MG tablet Take 10 mg by mouth at bedtime.    Yes [provider]  tamsulosin (FLOMAX) 0.4 MG CAPS capsule Take 0.4 mg by mouth every evening.   Yes [provider]  tiotropium (SPIRIVA) 18 MCG inhalation capsule Place 18 mcg into inhaler and inhale daily.   Yes [provider]  Wheat Dextrin (BENEFIBER DRINK MIX PO) Take 5 g by mouth daily.   Yes [provider]  alum & mag hydroxide-simeth (MAALOX/MYLANTA) 200-200-20 MG/5ML suspension Take 30 mLs by mouth every 12 (twelve) hours as needed for  indigestion or heartburn.    [provider]  B Complex Vitamins (VITAMIN B-COMPLEX) TABS Take 1 tablet by mouth daily. 06/09/21   [provider]  Calcium Carbonate-Vit D-Min (CALCIUM 600+D PLUS MINERALS) 600-400 MG-UNIT TABS Take 1 tablet by mouth 2 (two) times daily.    [provider]  Dextromethorphan-Benzocaine (CEPACOL SORE THROAT & COUGH) 5-7.5 MG LOZG Use as directed 1 lozenge in the mouth or throat every 3 (three) hours as needed (sore throat).    [provider]  nitroGLYCERIN (NITROSTAT) 0.4 MG SL tablet Place 0.4 mg under the tongue  every 5 (five) minutes as needed for chest pain.    [provider]  nystatin (MYCOSTATIN/NYSTOP) powder Apply 1 application topically 2 (two) times daily as needed (groin rash).    [provider]  polyethylene glycol (MIRALAX / GLYCOLAX) packet Take 17 g by mouth daily.     [provider]    Current Facility-Administered Medications  Medication Dose Route Frequency Provider Last Rate Last Admin   0.9 %  sodium chloride infusion   Intravenous Continuous Ivor Costa, MD 75 mL/hr at 10/16/21 1337 New Bag at 10/16/21 1337   acetaminophen (TYLENOL) tablet 650 mg  650 mg Oral Q6H PRN Ivor Costa, MD       albuterol (PROVENTIL) (2.5 MG/3ML) 0.083% nebulizer solution 3 mL  3 mL Inhalation Q4H PRN Ivor Costa, MD       ALPRAZolam Duanne Moron) tablet 0.5 mg  0.5 mg Oral BID Ivor Costa, MD       alum & mag hydroxide-simeth (MAALOX/MYLANTA) 200-200-20 MG/5ML suspension 30 mL  30 mL Oral Q12H PRN Ivor Costa, MD       aspirin chewable tablet 81 mg  81 mg Oral Daily Ivor Costa, MD       B-complex with vitamin C tablet 1 tablet  1 tablet Oral Daily Ivor Costa, MD       bisacodyl (DULCOLAX) EC tablet 5 mg  5 mg Oral Daily Ivor Costa, MD       Derrill Memo ON 10/17/2021] bisacodyl (DULCOLAX) suppository 10 mg  10 mg Rectal q AM Ivor Costa, MD       calcium-vitamin D (OSCAL WITH D) 500-5 MG-MCG per tablet 1 tablet  1 tablet  Oral BID Ivor Costa, MD       dextromethorphan-guaiFENesin ([place] DM) 30-600 MG per 12 hr tablet 1 tablet  1 tablet Oral BID PRN Ivor Costa, MD       gabapentin (NEURONTIN) capsule 800 mg  800 mg Oral TID Ivor Costa, MD       heparin injection 5,000 Units  5,000 Units Subcutaneous Q8H Ivor Costa, MD   5,000 Units at 10/16/21 1345   hydrALAZINE (APRESOLINE) injection 5 mg  5 mg Intravenous Q2H PRN Ivor Costa, MD       HYDROcodone-acetaminophen (NORCO/VICODIN) 5-325 MG per tablet 1 tablet  1 tablet Oral Q12H Ivor Costa, MD       lactulose ([place]) 10 GM/15ML solution 20 g  20 g Oral Daily Ivor Costa, MD       levETIRAcetam (KEPPRA) IVPB 1000 mg/100 mL premix  1,000 mg Intravenous Huntley Dec, MD   Stopped at 10/16/21 1259   lisinopril (ZESTRIL) tablet 5 mg  5 mg Oral Daily Ivor Costa, MD       LORazepam (ATIVAN) injection 0.5 mg  0.5 mg Intravenous Q6H PRN Ivor Costa, MD   0.5 mg at 10/16/21 1447   LORazepam (ATIVAN) injection 1 mg  1 mg Intravenous Q2H PRN Ivor Costa, MD       menthol-cetylpyridinium (CEPACOL) lozenge 3 mg  1 lozenge Oral Q3H PRN Ivor Costa, MD       meropenem (MERREM) 1 g in sodium chloride 0.9 % 100 mL IVPB  1 g Intravenous Q8H Wynelle Cleveland, Parks at 10/16/21 1415   metoprolol succinate (TOPROL-XL) 24 hr tablet 25 mg  25 mg Oral Daily Ivor Costa, MD       morphine (PF) 2 MG/ML injection 1 mg  1 mg Intravenous Q3H PRN Ivor Costa, MD  1 mg at 10/16/21 1543   nicotine (NICODERM CQ - dosed in mg/24 hours) patch 21 mg  21 mg Transdermal Daily Ivor Costa, MD       nitroGLYCERIN (NITROSTAT) SL tablet 0.4 mg  0.4 mg Sublingual Q5 min PRN Ivor Costa, MD       nystatin (MYCOSTATIN/NYSTOP) topical powder 1 application  1 application Topical BID PRN Ivor Costa, MD       omega-3 acid ethyl esters (LOVAZA) capsule 1,000 mg  1,000 mg Oral Daily Ivor Costa, MD       ondansetron [facility]) injection 4 mg  4 mg Intravenous Q8H PRN Ivor Costa, MD   4 mg at 10/16/21 1153    pantoprazole (PROTONIX) EC tablet 20 mg  20 mg Oral q AM Ivor Costa, MD       polyethylene glycol (MIRALAX / GLYCOLAX) packet 17 g  17 g Oral Daily Ivor Costa, MD       senna (SENOKOT) tablet 8.6 mg  1 tablet Oral QPM Ivor Costa, MD       sertraline (ZOLOFT) tablet 50 mg  50 mg Oral Daily Ivor Costa, MD       Derrill Memo ON 10/17/2021] simvastatin (ZOCOR) tablet 10 mg  10 mg Oral QHS Ivor Costa, MD       tamsulosin (FLOMAX) capsule 0.4 mg  0.4 mg Oral QPM Ivor Costa, MD       tiotropium (SPIRIVA) inhalation capsule ([facility] use ONLY) 18 mcg  18 mcg Inhalation Daily Ivor Costa, MD       Current Outpatient Medications  Medication Sig Dispense Refill   acetaminophen (TYLENOL) 325 MG tablet Take 650 mg by mouth every 6 (six) hours.     ALPRAZolam (XANAX) 0.25 MG tablet Take 0.25 mg by mouth daily.     ALPRAZolam (XANAX) 0.5 MG tablet Take 0.5 mg by mouth 2 (two) times daily.     aspirin 81 MG chewable tablet Take 81 mg by mouth daily.     B Complex-C (B-COMPLEX WITH VITAMIN C) tablet Take 1 tablet by mouth daily.     bisacodyl (DULCOLAX) 10 MG suppository Place 10 mg rectally in the morning.     bisacodyl (DULCOLAX) 5 MG EC tablet Take 5 mg by mouth daily.     clindamycin-benzoyl peroxide (BENZACLIN) gel Apply 1 application topically every morning.     furosemide (LASIX) 20 MG tablet Take 60 mg by mouth daily.      gabapentin (NEURONTIN) 100 MG capsule Take 100 mg by mouth in the morning. (Take only with morning 800mg  dose to equal 900mg  total)     gabapentin (NEURONTIN) 400 MG capsule Take 2 capsules (800 mg total) by mouth 3 (three) times daily. 30 capsule 0   guaiFENesin (MUCINEX) 600 MG 12 hr tablet Take 600 mg by mouth 2 (two) times daily.     HYDROcodone-acetaminophen (NORCO/VICODIN) 5-325 MG tablet Take 1 tablet by mouth every 12 (twelve) hours.     lactulose (CHRONULAC) 10 GM/15ML solution Take 20 g by mouth daily.     levETIRAcetam (KEPPRA) 500 MG tablet Take 1,000 mg by mouth 2 (two) times  daily.     Lidocaine 4 % PTCH Apply 1 patch topically at bedtime. Apply to left side of neck daily at bedtime for 5 days     Lidocaine HCl 4 % CREA Apply 1 application topically daily. Apply from left side of face, down to left side of body to left foot     lisinopril (ZESTRIL) 5  MG tablet Take 1 tablet (5 mg total) by mouth daily. 30 tablet 0   Menthol, Topical Analgesic, (BIOFREEZE) 4 % GEL Apply 1 application topically daily. (Apply to left side of face and body)     metoprolol succinate (TOPROL-XL) 25 MG 24 hr tablet Take 25 mg by mouth daily.     Omega-3 1000 MG CAPS Take 1,000 mg by mouth daily.     ondansetron (ZOFRAN-ODT) 4 MG disintegrating tablet Take 4 mg by mouth every 8 (eight) hours as needed for nausea or vomiting.     pantoprazole (PROTONIX) 20 MG tablet Take 20 mg by mouth in the morning.     senna (SENOKOT) 8.6 MG TABS tablet Take 1 tablet (8.6 mg total) by mouth at bedtime. (Patient taking differently: Take 1 tablet by mouth every evening.) 120 tablet 0   sertraline (ZOLOFT) 50 MG tablet Take 50 mg by mouth daily.     simvastatin (ZOCOR) 10 MG tablet Take 10 mg by mouth at bedtime.      tamsulosin (FLOMAX) 0.4 MG CAPS capsule Take 0.4 mg by mouth every evening.     tiotropium (SPIRIVA) 18 MCG inhalation capsule Place 18 mcg into inhaler and inhale daily.     Wheat Dextrin (BENEFIBER DRINK MIX PO) Take 5 g by mouth daily.     alum & mag hydroxide-simeth (MAALOX/MYLANTA) 200-200-20 MG/5ML suspension Take 30 mLs by mouth every 12 (twelve) hours as needed for indigestion or heartburn.     B Complex Vitamins (VITAMIN B-COMPLEX) TABS Take 1 tablet by mouth daily.     Calcium Carbonate-Vit D-Min (CALCIUM 600+D PLUS MINERALS) 600-400 MG-UNIT TABS Take 1 tablet by mouth 2 (two) times daily.     Dextromethorphan-Benzocaine (CEPACOL SORE THROAT & COUGH) 5-7.5 MG LOZG Use as directed 1 lozenge in the mouth or throat every 3 (three) hours as needed (sore throat).     nitroGLYCERIN  (NITROSTAT) 0.4 MG SL tablet Place 0.4 mg under the tongue every 5 (five) minutes as needed for chest pain.     nystatin (MYCOSTATIN/NYSTOP) powder Apply 1 application topically 2 (two) times daily as needed (groin rash).     polyethylene glycol (MIRALAX / GLYCOLAX) packet Take 17 g by mouth daily.       Allergies as of 10/16/2021   (No Known Allergies)     Review of Systems:     Review of Systems  Unable to perform ROS: Mental status change      Physical Exam:  Vital signs in last 24 hours: Temp:  [97.9 F (36.6 C)-100.4 F (38 C)] 97.9 F (36.6 C) (02/12 1025) Pulse Rate:  [69-100] 100 (02/12 1530) Resp:  [15-22] 20 (02/12 1530) BP: (120-158)/(79-106) 154/92 (02/12 1530) SpO2:  [89 %-100 %] 100 % (02/12 1530) Weight:  [97.5 kg] 97.5 kg (02/12 1212)   General:   In mild distress from abdominal discomfort Head:  Normocephalic and atraumatic. Eyes:   No icterus.   Conjunctiva pink. Mouth: Mucosa pink moist, no lesions. Neck:  Supple; no masses felt Lungs:  No respiratory distress Heart:  S1S2, RRR, no MRG. No edema. Abdomen:   Distended Rectal:  Not performed.  Msk:  Symmetrical without gross deformities. Neurologic:  Alert and  oriented x4;  Cranial nerves II-XII intact.  Skin:  Warm, dry, pink without significant lesions or rashes. Psych:  Alert and cooperative. Normal affect.  LAB RESULTS: Recent Labs    10/16/21 0700  WBC 7.6  HGB 14.3  HCT 45.7  PLT 282  BMET Recent Labs    10/16/21 0700  NA 138  K 3.5  CL 95*  CO2 31  GLUCOSE 108*  BUN 17  CREATININE 1.32*  CALCIUM 9.9   LFT Recent Labs    10/16/21 0700  PROT 7.7  ALBUMIN 4.3  AST 20  ALT 20  ALKPHOS 67  BILITOT 0.6   PT/INR Recent Labs    10/16/21 0700  LABPROT 13.3  INR 1.0    STUDIES: DG Abdomen 1 View  Result Date: 10/16/2021 CLINICAL DATA:  Nasogastric tube placement. EXAM: ABDOMEN - 1 VIEW COMPARISON:  08/10/2019. FINDINGS: Nasal/orogastric tube passes below the  diaphragm, tip in the proximal stomach, side hole likely above the GE junction. Recommend further insertion for more optimal positioning. Increased bowel gas with no overt bowel dilation. IMPRESSION: 1. Nasal/orogastric tube tip lies in the proximal stomach. Recommend inserting another 10 cm for more optimal positioning. Electronically Signed   By: Lajean Manes M.D.   On: 10/16/2021 15:27   CT ABDOMEN PELVIS W CONTRAST  Result Date: 10/16/2021 CLINICAL DATA:  Acute, nonlocalized abdominal pain EXAM: CT ABDOMEN AND PELVIS WITH CONTRAST TECHNIQUE: Multidetector CT imaging of the abdomen and pelvis was performed using the standard protocol following bolus administration of intravenous contrast. RADIATION DOSE REDUCTION: This exam was performed according to the departmental dose-optimization program which includes automated exposure control, adjustment of the mA and/or kV according to patient size and/or use of iterative reconstruction technique. CONTRAST:  4mL OMNIPAQUE IOHEXOL 300 MG/ML  SOLN COMPARISON:  09/20/2019 FINDINGS: Lower chest: Elevated right diaphragm with scarring or atelectasis at the base. Coronary atherosclerosis. Hepatobiliary: No focal liver abnormality.No evidence of biliary obstruction or stone. Pancreas: Unremarkable. Spleen: Unremarkable. Adrenals/Urinary Tract: Negative adrenals. Patchy bilateral renal cortical scarring. No hydronephrosis. Unremarkable bladder. Stomach/Bowel: Diffuse gas and fluid distention of the colon, also seen on prior. Changes of sigmoidectomy. Gas and fluid distended stomach and proximal small bowel without discrete transition point. No bowel wall thickening /inflammation. Vascular/Lymphatic: Scattered atheromatous calcifications. Negative for mass or adenopathy. No mass or adenopathy. Reproductive:No pathologic findings, partially obscured pelvis from streak artifact. Other: No ascites or pneumoperitoneum. Musculoskeletal: No acute abnormalities.  Left hip  arthroplasty. IMPRESSION: 1. Fluid distended stomach and proximal small bowel suggesting partial obstruction, although a discrete transition point is not seen. Suggest follow-up radiographs. 2. Noncontiguous diffuse distention of colon with fluid levels, also seen on 2021 comparison, question risk factors for chronic diarrheal illness or ileus. Electronically Signed   By: Jorje Guild M.D.   On: 10/16/2021 08:33   DG Chest Port 1 View  Result Date: 10/16/2021 CLINICAL DATA:  Sepsis workup. EXAM: PORTABLE CHEST 1 VIEW COMPARISON:  Portable chest 10/02/2021. FINDINGS: There are multiple overlying monitor wires. Stable mediastinum with aortic calcification and atherosclerosis. No vascular congestion is seen. Heart is slightly enlarged. Visualized lung fields are clear apart from right perihilar increased linear atelectasis. There is limited visualization of the right lower lung field due to again noted elevated right hemidiaphragm. Thoracic cage is intact. IMPRESSION: Increased right perihilar linear atelectasis with no evidence of acute chest disease or further interval changes. Limited view of the right base due to chronic elevation of the right diaphragm. Electronically Signed   By: Telford Nab M.D.   On: 10/16/2021 06:10       Impression / Plan:   71 y/o gentleman with multiple medical problems including COPD, chronic abdominal pain, CVA's, and history of colon cancer s/p sigmoidectomy here with imaging concerning for partial small  bowel obstruction and/or ileus. Requiring NG tube due to abdominal distension and vomiting  - continue NG tube - NPO except water to rinse mouth - convert necessary medicines to IV if possible - avoid ALL opioid pain medicines - replete electrolytes - f/u stool studies if diarrhea present - avoid reglan given obstruction  In terms of GI evaluation, EGD/Colonoscopy contraindicated given ileus/partial obstruction. Feel a large factor in his case is opioids,  chronic constipation, and immobility.  Will follow along for now.  Raylene Miyamoto MD, MPH [facility]

## 2021-10-16 NOTE — ED Provider Notes (Signed)
This patient was signed out to me at 7 AM.  He is a 71 year old male presenting with abdominal pain.  He has chronic left-sided abdominal pain but complaining of right upper quadrant pain today.  Noted to be distended on exam.  Notable for temp of 100.4, heart rate 91 rest 22 for meeting SIRS criteria.  Sepsis order set ordered by provider before me.  He is pending his labs and will need to have a CT ordered based on his creatinine.  His labs are overall reassuring he has no leukocytosis lactate is normal CMP with normal LFTs.  GFR greater than 30.  Will order CT abdomen pelvis with contrast.  Patient requesting pain medication for his chronic left-sided pain.  1 mg of Dilaudid ordered.  CT demonstrating likely partial SBO.  Discussed with Dr. Lysle Pearl with surgery who saw the patient, recommended conservative management without NG tube.  Patient UA positive for UTI likely.  Given a dose of ceftriaxone.  Discussed with the hospitalist for admission   Rada Hay, MD 10/16/21 1220

## 2021-10-16 NOTE — ED Notes (Signed)
Pt actively vomiting at this time.

## 2021-10-16 NOTE — Assessment & Plan Note (Signed)
-  Nicotine patch 

## 2021-10-16 NOTE — ED Triage Notes (Signed)
Pt comes from [facility] via [facility] with complaints of RUQ abdominal pain. Pt reports feeling bloated. Pt also experiencing some nausea.

## 2021-10-16 NOTE — Progress Notes (Signed)
Pharmacy Antibiotic Note  Calvin [name] is a 71 y.o. male admitted on 10/16/2021 with  possible SBO/acute gastroenteritis .  Pharmacy has been consulted for meropenem dosing for possible UTI.  Presented with abdominal pain and nausea. Patient is febrile, borderline tachycardic, and tachypneic. UA with pyuria. Unclear source. SBO r/o, now considering acute gastroenteritis or chronic GI motility issues.   Recent urine culture from 1/15 grew Enterobacter cloacae (R cefazolin, pip/tazo).  Plan: Meropenem 1 gram every 8 hours  Monitor clinical picture, source identification, renal function, LOT.   Temp (24hrs), Avg:99.2 F (37.3 C), Min:97.9 F (36.6 C), Max:100.4 F (38 C)  Recent Labs  Lab 10/16/21 0700  WBC 7.6  CREATININE 1.32*  LATICACIDVEN 1.3    CrCl cannot be calculated (Unknown ideal weight.).    No Known Allergies  Antimicrobials this admission: 2/12 meropenem >>    Dose adjustments this admission: N/a  Microbiology results: 2/12 BCx: collected 2/12 UCx: to be collected 2/12 stool: to be collected  Thank you for allowing pharmacy to be a part of this patients care.   Wynelle Cleveland, PharmD Pharmacy Resident  10/16/2021 12:15 PM

## 2021-10-16 NOTE — ED Notes (Signed)
IV team at bedside 

## 2021-10-17 DIAGNOSIS — R109 Unspecified abdominal pain: Secondary | ICD-10-CM | POA: Diagnosis not present

## 2021-10-17 LAB — BASIC METABOLIC PANEL
Anion gap: 13 (ref 5–15)
BUN: 17 mg/dL (ref 8–23)
CO2: 28 mmol/L (ref 22–32)
Calcium: 8.8 mg/dL — ABNORMAL LOW (ref 8.9–10.3)
Chloride: 103 mmol/L (ref 98–111)
Creatinine, Ser: 1.18 mg/dL (ref 0.61–1.24)
GFR, Estimated: >60 mL/min (ref >60)
Glucose, Bld: 89 mg/dL (ref 70–99)
Potassium: 4 mmol/L (ref 3.5–5.1)
Sodium: 144 mmol/L (ref 135–145)

## 2021-10-17 LAB — CBC
HCT: 40.9 % (ref 39.0–52.0)
Hemoglobin: 12.9 g/dL — ABNORMAL LOW (ref 13.0–17.0)
MCH: 30.7 pg (ref 26.0–34.0)
MCHC: 31.5 g/dL (ref 30.0–36.0)
MCV: 97.4 fL (ref 80.0–100.0)
Platelets: 208 10*3/uL (ref 150–400)
RBC: 4.2 MIL/uL — ABNORMAL LOW (ref 4.22–5.81)
RDW: 12.5 % (ref 11.5–15.5)
WBC: 6.4 10*3/uL (ref 4.0–10.5)
nRBC: 0 % (ref 0.0–0.2)

## 2021-10-17 LAB — MRSA NEXT GEN BY PCR, NASAL: MRSA by PCR Next Gen: NOT DETECTED

## 2021-10-17 LAB — TROPONIN I (HIGH SENSITIVITY)
Troponin I (High Sensitivity): 45 ng/L — ABNORMAL HIGH (ref <18)
Troponin I (High Sensitivity): 60 ng/L — ABNORMAL HIGH (ref <18)

## 2021-10-17 MED ORDER — KETOROLAC TROMETHAMINE 15 MG/ML IJ SOLN
15.0000 mg | Freq: Four times a day (QID) | INTRAMUSCULAR | Status: AC | PRN
  Administered 2021-10-17 – 2021-10-21 (×8): 15 mg via INTRAVENOUS
  Filled 2021-10-17 (×8): qty 1

## 2021-10-17 MED ORDER — PANTOPRAZOLE SODIUM 40 MG IV SOLR
40.0000 mg | INTRAVENOUS | Status: DC
  Administered 2021-10-17 – 2021-11-02 (×15): 40 mg via INTRAVENOUS
  Filled 2021-10-17 (×15): qty 10

## 2021-10-17 MED ORDER — METHOCARBAMOL 1000 MG/10ML IJ SOLN
500.0000 mg | Freq: Three times a day (TID) | INTRAVENOUS | Status: DC
  Administered 2021-10-17 – 2021-10-21 (×11): 500 mg via INTRAVENOUS
  Filled 2021-10-17: qty 5
  Filled 2021-10-17: qty 500
  Filled 2021-10-17 (×2): qty 5
  Filled 2021-10-17: qty 500
  Filled 2021-10-17: qty 5
  Filled 2021-10-17: qty 500
  Filled 2021-10-17: qty 5
  Filled 2021-10-17 (×2): qty 500
  Filled 2021-10-17: qty 5
  Filled 2021-10-17: qty 500

## 2021-10-17 MED ORDER — HYDRALAZINE HCL 20 MG/ML IJ SOLN
10.0000 mg | INTRAMUSCULAR | Status: DC | PRN
  Administered 2021-10-22 – 2021-10-26 (×2): 10 mg via INTRAVENOUS
  Filled 2021-10-17 (×2): qty 1

## 2021-10-17 NOTE — TOC Initial Note (Signed)
Transition of Care [facility]) - Initial/Assessment Note    Patient Details  Name: Calvin [name] MRN: [id number] Date of Birth: 1950-10-24  [facility]) CM/SW Contact:    Candie Chroman, LCSW Phone Number: 10/17/2021, 10:46 AM  Clinical Narrative:  Patient not fully oriented. Called daughter, introduced role, and explained that discharge planning would be discussed. Daughter confirmed he is a long-term resident at [facility] and plan is to return at discharge. Also contacted admissions coordinator. No further concerns. CSW encouraged patients daughter to contact CSW as needed. CSW will continue to follow patient and his daughter for support and facilitate return to SNF once medically stable.                Expected Discharge Plan: Skilled Nursing Facility Barriers to Discharge: Continued Medical Work up   Patient Goals and CMS Choice     Choice offered to / list presented to : NA  Expected Discharge Plan and Services Expected Discharge Plan: [place] Acute Care Choice: Resumption of Svcs/PTA Provider Living arrangements for the past 2 months: [place]                                      Prior Living Arrangements/Services Living arrangements for the past 2 months: [place] Lives with:: Facility Resident Patient language and need for interpreter reviewed:: Yes Do you feel safe going back to the place where you live?: Yes      Need for Family Participation in Patient Care: Yes (Comment) Care giver support system in place?: Yes (comment)   Criminal Activity/Legal Involvement Pertinent to Current Situation/Hospitalization: No - Comment as needed  Activities of Daily Living Home Assistive Devices/Equipment: Gilford Rile (specify type) ADL Screening (condition at time of admission) Patient's cognitive ability adequate to safely complete daily activities?: No Is the patient deaf or have difficulty  hearing?: Yes Does the patient have difficulty seeing, even when wearing glasses/contacts?: Yes Does the patient have difficulty concentrating, remembering, or making decisions?: Yes Patient able to express need for assistance with ADLs?: Yes Does the patient have difficulty dressing or bathing?: Yes Independently performs ADLs?: No Communication: Independent Dressing (OT): Dependent Is this a change from baseline?: Pre-admission baseline Grooming: Dependent Is this a change from baseline?: Pre-admission baseline Feeding: Independent Bathing: Dependent Is this a change from baseline?: Pre-admission baseline Toileting: Needs assistance Is this a change from baseline?: Pre-admission baseline In/Out Bed: Dependent Is this a change from baseline?: Pre-admission baseline Walks in Home: Needs assistance Does the patient have difficulty walking or climbing stairs?: Yes Weakness of Legs: Both Weakness of Arms/Hands: Both  Permission Sought/Granted Permission sought to share information with : Facility Sport and exercise psychologist, Family Supports    Share Information with NAME: [name]  Permission granted to share info w AGENCY: [facility]  Permission granted to share info w Relationship: Daughter  Permission granted to share info w Contact Information: [phone]  Emotional Assessment Appearance:: Appears stated age Attitude/Demeanor/Rapport: Unable to Assess Affect (typically observed): Unable to Assess Orientation: : Oriented to Self, Oriented to Place, Oriented to  Time Alcohol / Substance Use: Not Applicable Psych Involvement: No (comment)  Admission diagnosis:  SBO (small bowel obstruction) (HCC) [K56.609] Abdominal pain [R10.9] Urinary tract infection without hematuria, site unspecified [N39.0] Patient Active Problem List   Diagnosis Date Noted   HLD (hyperlipidemia) 10/16/2021  Stroke ([place]) 10/16/2021   Nausea vomiting and diarrhea 10/16/2021   Sepsis  ([place]) 10/16/2021   Chronic kidney disease, stage 3a ([place]) 10/16/2021   Tobacco abuse 10/16/2021   Muscle twitching 08/14/2019   UTI (urinary tract infection) 08/03/2019   Ileus ([place]) 08/03/2019   Hypokalemia 08/03/2019   QT prolongation 08/03/2019   Ogilvie's syndrome    Abdominal pain 04/03/2019   Left-sided weakness 09/30/2017   Seizures ([place]) 09/30/2017   HTN (hypertension) 09/30/2017   CAD (coronary artery disease) 09/30/2017   COPD (chronic obstructive pulmonary disease) ([place]) 09/30/2017   Depression with anxiety 09/30/2017   PCP:  Pcp, No Pharmacy:   [place], [place] - [address] [place] [address] Phone: [phone] Fax: [phone]     Social Determinants of Health (SDOH) Interventions    Readmission Risk Interventions Readmission Risk Prevention Plan 08/08/2019  Transportation Screening Complete  HRI or [place] Not Complete  HRI or Home Care Consult comments not indicated from LTC  Medication Review (RN Care Manager) Complete  Some recent data might be hidden

## 2021-10-17 NOTE — Progress Notes (Signed)
PROGRESS NOTE    Calvin [name]  UXL:[id number] DOB: [birth year] DOA: 10/16/2021 PCP: Pcp, No    Brief Narrative:  71 y.o. male with medical history significant of Ogilvie's syndrome, ileus, hypertension, hyperlipidemia, COPD, stroke with mild left-sided weakness, GERD, gout, depression with anxiety, seizure, CAD, MI,  alcohol abuse, CKD stage IIIa, dCHF, colon cancer, thyroid cancer, who presents with nausea vomiting, abdominal pain.    Patient is a poor historian, history is limited. Pt states that has been having abdominal pain for 2 days, associated with nausea, nonbilious nonbloody vomiting several times.  He also has abdominal distention.  Per his daughter (I called her daughter by phone), she noticed patient's abdominal distention several days ago.  Patient states that he has diarrhea, but per nurse observation, patient's stools are formed in the ED.  Patient does not have chest pain, cough, shortness of breath.  Patient has fever 100.4 in ED.    Assessment & Plan:   Principal Problem:   Abdominal pain Active Problems:   Seizures (HCC)   HTN (hypertension)   CAD (coronary artery disease)   COPD (chronic obstructive pulmonary disease) (HCC)   Depression with anxiety   Ogilvie's syndrome   UTI (urinary tract infection)   HLD (hyperlipidemia)   Stroke (HCC)   Sepsis (HCC)   Chronic kidney disease, stage 3a (HCC)   Tobacco abuse  Abdominal pain Possible partial SBO versus ileus Intractable nausea and vomiting secondary to above Patient's symptoms likely secondary to Ogilvie syndrome Chronic constipation also on chronic narcotics NG tube placed on admission Plan: Continue NG tube to low intermittent suction N.p.o. Hold oral medications for now IV antireflux and antiemetics Discontinue narcotics Avoid Reglan C. difficile negative, GI PCR panel should diarrhea recur Surgery and GI follow-up  History of Ogilvie syndrome Laxatives on hold  Possible UTI, felt  unlikely Sepsis ruled out Likely that fever, tachycardia, tachypnea driven by partial SBO rather than infection Plan: Discontinue antibiotics for now Monitor vitals and fever curve Monitor culture, if positive we will reconsider starting antibiotics again IV fluids  CAD (coronary artery disease): no chest pain -ASA, Zocor   COPD (chronic obstructive pulmonary disease) ([place]): Stable -Bronchodilators   Depression with anxiety -Continue home medications   HLD (hyperlipidemia) -zocor   HTN (hypertension) -IV hydralazine as needed -Continue lisinopril, metoprolol once able to take p.o.   Seizure -Seizure precaution -When necessary Ativan for seizure - change home oral Keppra to IV 1000 mg bid   History of Stroke ([place]) -ASA and zocor   Chronic kidney disease, stage 3a ([place]): stable -f/u by BMP   Tobacco abuse: -Nicotine patch   DVT prophylaxis: SQ heparin Code Status: DNR Family Communication: None today Disposition Plan: Status is: Inpatient Remains inpatient appropriate because: Partial SBO with NGT in place  Level of care: Telemetry Medical  Consultants:  GI Surgery  Procedures:  NG tube placement  Antimicrobials: None   Subjective: Seen and examined.  Endorses thirst.  Endorses improvement in abdominal pain and distention.  NG tube in place  Objective: Vitals:   10/16/21 1944 10/17/21 0411 10/17/21 0758 10/17/21 0904  BP: (!) 159/85 138/75 (!) 150/79 (!) 141/82  Pulse: 84 75 79 79  Resp: 20 18    Temp: 98.6 F (37 C) 98.7 F (37.1 C) 98.5 F (36.9 C) 98.4 F (36.9 C)  TempSrc: Oral  Oral Oral  SpO2: 93% 100% 100% 95%  Weight:        Intake/Output Summary (Last 24 hours) at 10/17/2021  [place] filed at 10/17/2021 0547 Gross per 24 hour  Intake 819.42 ml  Output 150 ml  Net 669.42 ml   Filed Weights   10/16/21 1212  Weight: 97.5 kg    Examination:  General exam: Appears fatigued Respiratory system: Decreased at bases.  Lungs  clear.  Normal work of breathing.  Room air Cardiovascular system: S1-S2, RRR, no murmurs, no pedal edema Gastrointestinal system: Mildly distended, tender to palpation Central nervous system: Alert and oriented. No focal neurological deficits. Extremities: Symmetric 5 x 5 power. Skin: No rashes, lesions or ulcers Psychiatry: Judgement and insight appear normal. Mood & affect appropriate.     Data Reviewed: I have personally reviewed following labs and imaging studies  CBC: Recent Labs  Lab 10/16/21 0700 10/17/21 0429  WBC 7.6 6.4  NEUTROABS 5.6  --   HGB 14.3 12.9*  HCT 45.7 40.9  MCV 97.4 97.4  PLT 282 631   Basic Metabolic Panel: Recent Labs  Lab 10/16/21 0700 10/17/21 0429  NA 138 144  K 3.5 4.0  CL 95* 103  CO2 31 28  GLUCOSE 108* 89  BUN 17 17  CREATININE 1.32* 1.18  CALCIUM 9.9 8.8*   GFR: Estimated Creatinine Clearance: 68 mL/min (by C-G formula based on SCr of 1.18 mg/dL). Liver Function Tests: Recent Labs  Lab 10/16/21 0700  AST 20  ALT 20  ALKPHOS 67  BILITOT 0.6  PROT 7.7  ALBUMIN 4.3   Recent Labs  Lab 10/16/21 0700  LIPASE 34   No results for input(s): AMMONIA in the last 168 hours. Coagulation Profile: Recent Labs  Lab 10/16/21 0700  INR 1.0   Cardiac Enzymes: No results for input(s): CKTOTAL, CKMB, CKMBINDEX, TROPONINI in the last 168 hours. BNP (last 3 results) No results for input(s): PROBNP in the last 8760 hours. HbA1C: No results for input(s): HGBA1C in the last 72 hours. CBG: No results for input(s): GLUCAP in the last 168 hours. Lipid Profile: No results for input(s): CHOL, HDL, LDLCALC, TRIG, CHOLHDL, LDLDIRECT in the last 72 hours. Thyroid Function Tests: No results for input(s): TSH, T4TOTAL, FREET4, T3FREE, THYROIDAB in the last 72 hours. Anemia Panel: No results for input(s): VITAMINB12, FOLATE, FERRITIN, TIBC, IRON, RETICCTPCT in the last 72 hours. Sepsis Labs: Recent Labs  Lab 10/16/21 0700 10/16/21 0743   PROCALCITON  --  <0.10  LATICACIDVEN 1.3  --     Recent Results (from the past 240 hour(s))  Resp Panel by RT-PCR (Flu A&B, Covid) Nasopharyngeal Swab     Status: None   Collection Time: 10/16/21  6:43 AM   Specimen: Nasopharyngeal Swab; Nasopharyngeal(NP) swabs in vial transport medium  Result Value Ref Range Status   SARS Coronavirus 2 by RT PCR NEGATIVE NEGATIVE Final    Comment: (NOTE) SARS-CoV-2 target nucleic acids are NOT DETECTED.  The SARS-CoV-2 RNA is generally detectable in upper respiratory specimens during the acute phase of infection. The lowest concentration of SARS-CoV-2 viral copies this assay can detect is 138 copies/mL. A negative result does not preclude SARS-Cov-2 infection and should not be used as the sole basis for treatment or other patient management decisions. A negative result may occur with  improper specimen collection/handling, submission of specimen other than nasopharyngeal swab, presence of viral mutation(s) within the areas targeted by this assay, and inadequate number of viral copies(<138 copies/mL). A negative result must be combined with clinical observations, patient history, and epidemiological information. The expected result is Negative.  Fact Sheet for Patients:  EntrepreneurPulse.com.au  Fact Sheet for Healthcare Providers:  IncredibleEmployment.be  This test is no t yet approved or cleared by the [place] FDA and  has been authorized for detection and/or diagnosis of SARS-CoV-2 by FDA under an Emergency Use Authorization (EUA). This EUA will remain  in effect (meaning this test can be used) for the duration of the COVID-19 declaration under Section 564(b)(1) of the Act, 21 U.S.C.section 360bbb-3(b)(1), unless the authorization is terminated  or revoked sooner.       Influenza A by PCR NEGATIVE NEGATIVE Final   Influenza B by PCR NEGATIVE NEGATIVE Final    Comment: (NOTE) The Xpert  Xpress SARS-CoV-2/FLU/RSV plus assay is intended as an aid in the diagnosis of influenza from Nasopharyngeal swab specimens and should not be used as a sole basis for treatment. Nasal washings and aspirates are unacceptable for Xpert Xpress SARS-CoV-2/FLU/RSV testing.  Fact Sheet for Patients: EntrepreneurPulse.com.au  Fact Sheet for Healthcare Providers: IncredibleEmployment.be  This test is not yet approved or cleared by the [place] FDA and has been authorized for detection and/or diagnosis of SARS-CoV-2 by FDA under an Emergency Use Authorization (EUA). This EUA will remain in effect (meaning this test can be used) for the duration of the COVID-19 declaration under Section 564(b)(1) of the Act, 21 U.S.C. section 360bbb-3(b)(1), unless the authorization is terminated or revoked.  Performed at [facility], [address] [place], [place] [address]   Blood culture (routine single)     Status: None (Preliminary result)   Collection Time: 10/16/21  7:00 AM   Specimen: BLOOD  Result Value Ref Range Status   Specimen Description BLOOD ARM  Final   Special Requests   Final    BOTTLES DRAWN AEROBIC AND ANAEROBIC Blood Culture adequate volume   Culture   Final    NO GROWTH < 24 HOURS Performed at [facility], [address], [place], [place] [address]    Report Status PENDING  Incomplete  C Difficile Quick Screen w PCR reflex     Status: None   Collection Time: 10/16/21  1:39 PM   Specimen: STOOL  Result Value Ref Range Status   C Diff antigen NEGATIVE NEGATIVE Final   C Diff toxin NEGATIVE NEGATIVE Final   C Diff interpretation No C. difficile detected.  Final    Comment: Performed at [facility], [address] [place], [place] [address]  Gastrointestinal Panel by PCR , Stool     Status: None   Collection Time: 10/16/21  1:39 PM   Specimen: STOOL  Result Value Ref Range Status   Campylobacter species  NOT DETECTED NOT DETECTED Final   Plesimonas shigelloides NOT DETECTED NOT DETECTED Final   Salmonella species NOT DETECTED NOT DETECTED Final   Yersinia enterocolitica NOT DETECTED NOT DETECTED Final   Vibrio species NOT DETECTED NOT DETECTED Final   Vibrio cholerae NOT DETECTED NOT DETECTED Final   Enteroaggregative E coli (EAEC) NOT DETECTED NOT DETECTED Final   Enteropathogenic E coli (EPEC) NOT DETECTED NOT DETECTED Final   Enterotoxigenic E coli (ETEC) NOT DETECTED NOT DETECTED Final   Shiga like toxin producing E coli (STEC) NOT DETECTED NOT DETECTED Final   Shigella/Enteroinvasive E coli (EIEC) NOT DETECTED NOT DETECTED Final   Cryptosporidium NOT DETECTED NOT DETECTED Final   Cyclospora cayetanensis NOT DETECTED NOT DETECTED Final   Entamoeba histolytica NOT DETECTED NOT DETECTED Final   Giardia lamblia NOT DETECTED NOT DETECTED Final   Adenovirus F40/41 NOT DETECTED NOT DETECTED Final  Astrovirus NOT DETECTED NOT DETECTED Final   Norovirus GI/GII NOT DETECTED NOT DETECTED Final   Rotavirus A NOT DETECTED NOT DETECTED Final   Sapovirus (I, II, IV, and V) NOT DETECTED NOT DETECTED Final    Comment: Performed at [facility], [address] [place], [place] [address]  MRSA Next Gen by PCR, Nasal     Status: None   Collection Time: 10/17/21  4:15 AM   Specimen: Nasal Mucosa; Nasal Swab  Result Value Ref Range Status   MRSA by PCR Next Gen NOT DETECTED NOT DETECTED Final    Comment: (NOTE) The GeneXpert MRSA Assay (FDA approved for NASAL specimens only), is one component of a comprehensive MRSA colonization surveillance program. It is not intended to diagnose MRSA infection nor to guide or monitor treatment for MRSA infections. Test performance is not FDA approved in patients less than 36 years old. Performed at [facility], [address], [place], [place] [address]          Radiology Studies: DG Abdomen 1 View  Result Date: 10/16/2021 CLINICAL  DATA:  Nasogastric tube placement. EXAM: ABDOMEN - 1 VIEW COMPARISON:  08/10/2019. FINDINGS: Nasal/orogastric tube passes below the diaphragm, tip in the proximal stomach, side hole likely above the GE junction. Recommend further insertion for more optimal positioning. Increased bowel gas with no overt bowel dilation. IMPRESSION: 1. Nasal/orogastric tube tip lies in the proximal stomach. Recommend inserting another 10 cm for more optimal positioning. Electronically Signed   By: Lajean Manes M.D.   On: 10/16/2021 15:27   CT ABDOMEN PELVIS W CONTRAST  Result Date: 10/16/2021 CLINICAL DATA:  Acute, nonlocalized abdominal pain EXAM: CT ABDOMEN AND PELVIS WITH CONTRAST TECHNIQUE: Multidetector CT imaging of the abdomen and pelvis was performed using the standard protocol following bolus administration of intravenous contrast. RADIATION DOSE REDUCTION: This exam was performed according to the departmental dose-optimization program which includes automated exposure control, adjustment of the mA and/or kV according to patient size and/or use of iterative reconstruction technique. CONTRAST:  78mL OMNIPAQUE IOHEXOL 300 MG/ML  SOLN COMPARISON:  09/20/2019 FINDINGS: Lower chest: Elevated right diaphragm with scarring or atelectasis at the base. Coronary atherosclerosis. Hepatobiliary: No focal liver abnormality.No evidence of biliary obstruction or stone. Pancreas: Unremarkable. Spleen: Unremarkable. Adrenals/Urinary Tract: Negative adrenals. Patchy bilateral renal cortical scarring. No hydronephrosis. Unremarkable bladder. Stomach/Bowel: Diffuse gas and fluid distention of the colon, also seen on prior. Changes of sigmoidectomy. Gas and fluid distended stomach and proximal small bowel without discrete transition point. No bowel wall thickening /inflammation. Vascular/Lymphatic: Scattered atheromatous calcifications. Negative for mass or adenopathy. No mass or adenopathy. Reproductive:No pathologic findings, partially  obscured pelvis from streak artifact. Other: No ascites or pneumoperitoneum. Musculoskeletal: No acute abnormalities.  Left hip arthroplasty. IMPRESSION: 1. Fluid distended stomach and proximal small bowel suggesting partial obstruction, although a discrete transition point is not seen. Suggest follow-up radiographs. 2. Noncontiguous diffuse distention of colon with fluid levels, also seen on 2021 comparison, question risk factors for chronic diarrheal illness or ileus. Electronically Signed   By: Jorje Guild M.D.   On: 10/16/2021 08:33   DG Chest Port 1 View  Result Date: 10/16/2021 CLINICAL DATA:  Sepsis workup. EXAM: PORTABLE CHEST 1 VIEW COMPARISON:  Portable chest 10/02/2021. FINDINGS: There are multiple overlying monitor wires. Stable mediastinum with aortic calcification and atherosclerosis. No vascular congestion is seen. Heart is slightly enlarged. Visualized lung fields are clear apart from right perihilar increased linear atelectasis. There is limited visualization of the right  lower lung field due to again noted elevated right hemidiaphragm. Thoracic cage is intact. IMPRESSION: Increased right perihilar linear atelectasis with no evidence of acute chest disease or further interval changes. Limited view of the right base due to chronic elevation of the right diaphragm. Electronically Signed   By: Telford Nab M.D.   On: 10/16/2021 06:10        Scheduled Meds:  ALPRAZolam  0.5 mg Oral BID   aspirin  81 mg Oral Daily   B-complex with vitamin C  1 tablet Oral Daily   calcium-vitamin D  1 tablet Oral BID   gabapentin  800 mg Oral TID   heparin  5,000 Units Subcutaneous Q8H   lisinopril  5 mg Oral Daily   metoprolol succinate  25 mg Oral Daily   nicotine  21 mg Transdermal Daily   omega-3 acid ethyl esters  1,000 mg Oral Daily   pantoprazole (PROTONIX) IV  40 mg Intravenous Q24H   polyethylene glycol  17 g Oral Daily   senna  1 tablet Oral QPM   sertraline  50 mg Oral Daily    simvastatin  10 mg Oral QHS   tamsulosin  0.4 mg Oral QPM   tiotropium  18 mcg Inhalation Daily   Continuous Infusions:  sodium chloride Stopped (10/17/21 0540)   levETIRAcetam 1,000 mg (10/17/21 1138)     LOS: 1 day      Sidney Ace, MD [facility]   If 7PM-7AM, please contact night-coverage  10/17/2021, 12:14 PM

## 2021-10-17 NOTE — Significant Event (Signed)
Notified by bedside RN, patient endorsing chest pain and left arm numbness.  Vitals stable.  Low suspicion for cardiac etiology, but we will work it up  Plan: 12 lead EKG STAT troponin Telemetry monitoring Notify cardiology overnight if continued concern for cardiac CP  Ralene Muskrat MD

## 2021-10-17 NOTE — Progress Notes (Signed)
MD notified of pt's c/o chest pain, pt on tele, ekg ordered. Labs ordered. Family at bedside updated. Vs wnl.

## 2021-10-17 NOTE — Progress Notes (Signed)
Subjective:  CC: Calvin [name] is a 71 y.o. male  Hospital stay day 1,   abdominal pain  HPI: Pt reported N/V after last exam.  Currently has NG in place.  States pain is overall same.  ROS:  General: Denies weight loss, weight gain, fatigue, fevers, chills, and night sweats. Heart: Denies chest pain, palpitations, racing heart, irregular heartbeat, leg pain or swelling, and decreased activity tolerance. Respiratory: Denies breathing difficulty, shortness of breath, wheezing, cough, and sputum. GI: Denies change in appetite, heartburn, nausea, vomiting, constipation, diarrhea, and blood in stool. GU: Denies difficulty urinating, pain with urinating, urgency, frequency, blood in urine.   Objective:   Temp:  [98.4 F (36.9 C)-99.1 F (37.3 C)] 99.1 F (37.3 C) (02/13 1601) Pulse Rate:  [73-80] 73 (02/13 1857) Resp:  [16-18] 16 (02/13 1601) BP: (138-155)/(75-89) 155/83 (02/13 1857) SpO2:  [95 %-100 %] 98 % (02/13 1857)       Weight: 97.5 kg BMI (Calculated): 29.57   Intake/Output this shift:   Intake/Output Summary (Last 24 hours) at 10/17/2021 2038 Last data filed at 10/17/2021 1800 Gross per 24 hour  Intake 790.26 ml  Output 150 ml  Net 640.26 ml    Constitutional :  alert, cooperative, appears stated age, and no distress  Respiratory:  clear to auscultation bilaterally  Cardiovascular:  regular rate and rhythm  Gastrointestinal: Soft, distended, with no focal TTP .   Skin: Cool and moist.   Psychiatric: Normal affect, non-agitated, not confused       LABS:  CMP Latest Ref Rng & Units 10/17/2021 10/16/2021 10/02/2021  Glucose 70 - 99 mg/dL 89 108(H) 101(H)  BUN 8 - 23 mg/dL 17 17 13   Creatinine 0.61 - 1.24 mg/dL 1.18 1.32(H) 1.42(H)  Sodium 135 - 145 mmol/L 144 138 135  Potassium 3.5 - 5.1 mmol/L 4.0 3.5 5.1  Chloride 98 - 111 mmol/L 103 95(L) 102  CO2 22 - 32 mmol/L 28 31 27   Calcium 8.9 - 10.3 mg/dL 8.8(L) 9.9 9.2  Total Protein 6.5 - 8.1 g/dL - 7.7 7.7  Total  Bilirubin 0.3 - 1.2 mg/dL - 0.6 0.5  Alkaline Phos 38 - 126 U/L - 67 67  AST 15 - 41 U/L - 20 29  ALT 0 - 44 U/L - 20 30   CBC Latest Ref Rng & Units 10/17/2021 10/16/2021 10/02/2021  WBC 4.0 - 10.5 K/uL 6.4 7.6 4.4  Hemoglobin 13.0 - 17.0 g/dL 12.9(L) 14.3 12.8(L)  Hematocrit 39.0 - 52.0 % 40.9 45.7 40.8  Platelets 150 - 400 K/uL 208 282 170    RADS: N/a Assessment:   pSBO.  N/V development so hold NPO for now.  Still having BM and supposedly passing gas.  Recommend supportive care for now.  If concern for persistent SBO, can consider SBFT.  labs/images/medications/previous chart entries reviewed personally and relevant changes/updates noted above.

## 2021-10-17 NOTE — Progress Notes (Signed)
GI Inpatient Follow-up Note  Subjective:  Patient seen and abdomen has improved significantly. Complaining of abdominal pain which is a chronic condition for him.  Scheduled Inpatient Medications:   ALPRAZolam  0.5 mg Oral BID   aspirin  81 mg Oral Daily   B-complex with vitamin C  1 tablet Oral Daily   calcium-vitamin D  1 tablet Oral BID   gabapentin  800 mg Oral TID   heparin  5,000 Units Subcutaneous Q8H   lisinopril  5 mg Oral Daily   metoprolol succinate  25 mg Oral Daily   nicotine  21 mg Transdermal Daily   omega-3 acid ethyl esters  1,000 mg Oral Daily   pantoprazole (PROTONIX) IV  40 mg Intravenous Q24H   polyethylene glycol  17 g Oral Daily   senna  1 tablet Oral QPM   sertraline  50 mg Oral Daily   simvastatin  10 mg Oral QHS   tamsulosin  0.4 mg Oral QPM   tiotropium  18 mcg Inhalation Daily    Continuous Inpatient Infusions:    sodium chloride Stopped (10/17/21 0540)   levETIRAcetam 1,000 mg (10/17/21 1138)   methocarbamol (ROBAXIN) IV 500 mg (10/17/21 1442)    PRN Inpatient Medications:  acetaminophen, albuterol, alum & mag hydroxide-simeth, dextromethorphan-guaiFENesin, hydrALAZINE, ketorolac, LORazepam, LORazepam, menthol-cetylpyridinium, nitroGLYCERIN, nystatin, ondansetron (ZOFRAN) IV  Review of Systems:  Unable to get clear answer from patient due to mental status   Physical Examination: BP 139/85 (BP Location: Left Arm)    Pulse 80    Temp 98.4 F (36.9 C) (Oral)    Resp 18    Wt 97.5 kg    SpO2 96%    BMI 29.57 kg/m  Gen: NAD HEENT: PEERLA, EOMI, Neck: supple, no JVD or thyromegaly Chest: No respiratory distress CV: RRR Abd: soft, endorses pain Ext: no edema, well perfused with 2+ pulses, Skin: no rash or lesions noted Lymph: no LAD  Data: Lab Results  Component Value Date   WBC 6.4 10/17/2021   HGB 12.9 (L) 10/17/2021   HCT 40.9 10/17/2021   MCV 97.4 10/17/2021   PLT 208 10/17/2021   Recent Labs  Lab 10/16/21 0700 10/17/21 0429   HGB 14.3 12.9*   Lab Results  Component Value Date   NA 144 10/17/2021   K 4.0 10/17/2021   CL 103 10/17/2021   CO2 28 10/17/2021   BUN 17 10/17/2021   CREATININE 1.18 10/17/2021   Lab Results  Component Value Date   ALT 20 10/16/2021   AST 20 10/16/2021   ALKPHOS 67 10/16/2021   BILITOT 0.6 10/16/2021   Recent Labs  Lab 10/16/21 0700  APTT 33  INR 1.0   Assessment/Plan: 71 y/o gentleman with multiple medical problems including COPD, chronic abdominal pain, CVA's, and history of colon cancer s/p sigmoidectomy here with imaging concerning for partial small bowel obstruction and/or ileus. Requiring NG tube due to abdominal distension and vomiting  Recommendations:  - can d/c NG tube - convert necessary medicines to IV if possible - avoid ALL opioid pain medicines - replete electrolytes - avoid reglan given obstruction - will consider repeat CT imaging tomorrow   In terms of GI evaluation, EGD/Colonoscopy contraindicated given ileus/partial obstruction. Feel a large factor in his case is opioids, chronic constipation, and immobility.   Will follow along for now.   Raylene Miyamoto MD, MPH [place]

## 2021-10-18 ENCOUNTER — Inpatient Hospital Stay: Payer: Medicare Other

## 2021-10-18 ENCOUNTER — Encounter: Payer: Self-pay | Admitting: Internal Medicine

## 2021-10-18 DIAGNOSIS — R109 Unspecified abdominal pain: Secondary | ICD-10-CM | POA: Diagnosis not present

## 2021-10-18 LAB — TROPONIN I (HIGH SENSITIVITY): Troponin I (High Sensitivity): 113 ng/L (ref <18)

## 2021-10-18 MED ORDER — DICLOFENAC SODIUM 1 % EX GEL
2.0000 g | Freq: Three times a day (TID) | CUTANEOUS | Status: DC | PRN
  Administered 2021-10-18 – 2021-11-02 (×13): 2 g via TOPICAL
  Filled 2021-10-18 (×3): qty 100

## 2021-10-18 NOTE — Consult Note (Signed)
GI Inpatient Follow-up Note  Subjective:  Patient seen and complains of being wet. No abdominal distension.  Scheduled Inpatient Medications:   ALPRAZolam  0.5 mg Oral BID   aspirin  81 mg Oral Daily   B-complex with vitamin C  1 tablet Oral Daily   calcium-vitamin D  1 tablet Oral BID   gabapentin  800 mg Oral TID   heparin  5,000 Units Subcutaneous Q8H   lisinopril  5 mg Oral Daily   metoprolol succinate  25 mg Oral Daily   nicotine  21 mg Transdermal Daily   omega-3 acid ethyl esters  1,000 mg Oral Daily   pantoprazole (PROTONIX) IV  40 mg Intravenous Q24H   polyethylene glycol  17 g Oral Daily   senna  1 tablet Oral QPM   sertraline  50 mg Oral Daily   simvastatin  10 mg Oral QHS   tamsulosin  0.4 mg Oral QPM   tiotropium  18 mcg Inhalation Daily    Continuous Inpatient Infusions:    sodium chloride 75 mL/hr at 10/18/21 0517   levETIRAcetam 1,000 mg (10/18/21 1220)   methocarbamol (ROBAXIN) IV 500 mg (10/18/21 1540)    PRN Inpatient Medications:  acetaminophen, albuterol, alum & mag hydroxide-simeth, dextromethorphan-guaiFENesin, diclofenac Sodium, hydrALAZINE, ketorolac, LORazepam, LORazepam, menthol-cetylpyridinium, nitroGLYCERIN, nystatin, ondansetron (ZOFRAN) IV  Review of Systems:  Unable to do due to mental status   Physical Examination: BP (!) 166/86 (BP Location: Left Arm)    Pulse 81    Temp 98 F (36.7 C)    Resp 18    Wt 97.5 kg    SpO2 98%    BMI 29.57 kg/m  Gen: NAD HEENT: PEERLA, EOMI, Neck: supple, no JVD or thyromegaly Chest: No respiratory distress Abd: soft, non-tender, non-distended Ext: no edema, well perfused with 2+ pulses, Skin: no rash or lesions noted Lymph: no LAD  Data: Lab Results  Component Value Date   WBC 6.4 10/17/2021   HGB 12.9 (L) 10/17/2021   HCT 40.9 10/17/2021   MCV 97.4 10/17/2021   PLT 208 10/17/2021   Recent Labs  Lab 10/16/21 0700 10/17/21 0429  HGB 14.3 12.9*   Lab Results  Component Value Date   NA  144 10/17/2021   K 4.0 10/17/2021   CL 103 10/17/2021   CO2 28 10/17/2021   BUN 17 10/17/2021   CREATININE 1.18 10/17/2021   Lab Results  Component Value Date   ALT 20 10/16/2021   AST 20 10/16/2021   ALKPHOS 67 10/16/2021   BILITOT 0.6 10/16/2021   Recent Labs  Lab 10/16/21 0700  APTT 33  INR 1.0   Assessment/Plan: 71 y/o gentleman with multiple medical problems including COPD, chronic abdominal pain, CVA's, and history of colon cancer s/p sigmoidectomy here with imaging concerning for partial small bowel obstruction and/or ileus. Requiring NG tube due to abdominal distension and vomiting  Recommendations:  - convert necessary medicines to IV if possible - avoid ALL opioid pain medicines - replete electrolytes - will order abdominal xray today and tomorrow morning - will let patient drink some water to assess response - spoke with family and recommend palliative care/pain consult for recommendations for non-opioid pain management to prevent this from happening the future   In terms of GI evaluation, EGD/Colonoscopy contraindicated given ileus/partial obstruction. No plans for inpatient procedures. Feel a large factor in his case is opioids, chronic constipation, and immobility.   Will follow along for now.   Raylene Miyamoto MD, MPH [place]

## 2021-10-18 NOTE — Progress Notes (Signed)
Subjective:  CC: Calvin [name] is a 71 y.o. male  Hospital stay day 2,   abdominal pain  HPI: No acute issues reported by patient.  Still asking for something to drink.  RN reports no flatus/BM, but no episodes of N/V either with NG out since yesterday pm.  ROS:  General: Denies weight loss, weight gain, fatigue, fevers, chills, and night sweats. Heart: Denies chest pain, palpitations, racing heart, irregular heartbeat, leg pain or swelling, and decreased activity tolerance. Respiratory: Denies breathing difficulty, shortness of breath, wheezing, cough, and sputum. GI: Denies change in appetite, heartburn, nausea, vomiting, constipation, diarrhea, and blood in stool. GU: Denies difficulty urinating, pain with urinating, urgency, frequency, blood in urine.   Objective:   Temp:  [97.7 F (36.5 C)-99.1 F (37.3 C)] 97.7 F (36.5 C) (02/14 0808) Pulse Rate:  [73-80] 74 (02/14 0808) Resp:  [16-18] 18 (02/14 0236) BP: (139-155)/(77-89) 152/77 (02/14 0808) SpO2:  [96 %-98 %] 98 % (02/14 0808)       Weight: 97.5 kg BMI (Calculated): 29.57   Intake/Output this shift:   Intake/Output Summary (Last 24 hours) at 10/18/2021 1004 Last data filed at 10/18/2021 5597 Gross per 24 hour  Intake 170.9 ml  Output 350 ml  Net -179.1 ml    Constitutional :  alert, cooperative, appears stated age, and no distress  Respiratory:  clear to auscultation bilaterally  Cardiovascular:  regular rate and rhythm  Gastrointestinal: Soft, distended, with no focal TTP . Slightly improved  Skin: Cool and moist.   Psychiatric: Normal affect, non-agitated, not confused       LABS:  CMP Latest Ref Rng & Units 10/17/2021 10/16/2021 10/02/2021  Glucose 70 - 99 mg/dL 89 108(H) 101(H)  BUN 8 - 23 mg/dL 17 17 13   Creatinine 0.61 - 1.24 mg/dL 1.18 1.32(H) 1.42(H)  Sodium 135 - 145 mmol/L 144 138 135  Potassium 3.5 - 5.1 mmol/L 4.0 3.5 5.1  Chloride 98 - 111 mmol/L 103 95(L) 102  CO2 22 - 32 mmol/L 28 31 27    Calcium 8.9 - 10.3 mg/dL 8.8(L) 9.9 9.2  Total Protein 6.5 - 8.1 g/dL - 7.7 7.7  Total Bilirubin 0.3 - 1.2 mg/dL - 0.6 0.5  Alkaline Phos 38 - 126 U/L - 67 67  AST 15 - 41 U/L - 20 29  ALT 0 - 44 U/L - 20 30   CBC Latest Ref Rng & Units 10/17/2021 10/16/2021 10/02/2021  WBC 4.0 - 10.5 K/uL 6.4 7.6 4.4  Hemoglobin 13.0 - 17.0 g/dL 12.9(L) 14.3 12.8(L)  Hematocrit 39.0 - 52.0 % 40.9 45.7 40.8  Platelets 150 - 400 K/uL 208 282 170    RADS: N/a Assessment:   pSBO.  Resolved N/V? With NG out since yesterday pm, but no flatus or BM reported.  Will discuss with GI next steps.  Continue to monitor for now  labs/images/medications/previous chart entries reviewed personally and relevant changes/updates noted above.

## 2021-10-18 NOTE — Progress Notes (Signed)
° °      CROSS COVER NOTE  NAME: Calvin [name] MRN: [id number] DOB : June 01, 1951   Secure chat received from RN stating patient said he felt like he was going to have a heart attack. RN reports patient has (L) sided chest pressure that he rates 10/10.  Nursing gave ativan and toradol Will give PRN nitro ordered for chest pain EKG - unchanged from prior  Troponin 10/17/21 2000  60--> 45  Tonight 113-->99  On bedside evaluation patient reports 3/10 chest pain after SL nitroglycerin. When asked to point to where it hurts patient points mid chest and says that it feels like he has gas, the pain does not radiate. Denies dypsnea, nausea, vomiting.   Give PRN Maalox/Mylanta   Patient already receiving daily aspirin 81 mg, daily metoprolol XL 25mg , daily simvastatin 10mg , subcutaneous heparin q8H, as well as protonix.  Patient also endorsed chest pain yesterday, troponin increase tonight compared to last night will consult cardiology.  Neomia Glass MHA, MSN, FNP-BC Nurse Practitioner [facility] Pager [phone]

## 2021-10-18 NOTE — Progress Notes (Signed)
PROGRESS NOTE    Calvin [name]  WCB:[id number] DOB: 1951/02/24 DOA: 10/16/2021 PCP: Pcp, No    Brief Narrative:  71 y.o. male with medical history significant of Ogilvie's syndrome, ileus, hypertension, hyperlipidemia, COPD, stroke with mild left-sided weakness, GERD, gout, depression with anxiety, seizure, CAD, MI,  alcohol abuse, CKD stage IIIa, dCHF, colon cancer, thyroid cancer, who presents with nausea vomiting, abdominal pain.    Patient is a poor historian, history is limited. Pt states that has been having abdominal pain for 2 days, associated with nausea, nonbilious nonbloody vomiting several times.  He also has abdominal distention.  Per his daughter (I called her daughter by phone), she noticed patient's abdominal distention several days ago.  Patient states that he has diarrhea, but per nurse observation, patient's stools are formed in the ED.  Patient does not have chest pain, cough, shortness of breath.  Patient has fever 100.4 in ED.    Assessment & Plan:   Principal Problem:   Abdominal pain Active Problems:   Seizures (HCC)   HTN (hypertension)   CAD (coronary artery disease)   COPD (chronic obstructive pulmonary disease) (HCC)   Depression with anxiety   Ogilvie's syndrome   UTI (urinary tract infection)   HLD (hyperlipidemia)   Stroke (HCC)   Sepsis (HCC)   Chronic kidney disease, stage 3a (HCC)   Tobacco abuse  Abdominal pain Possible partial SBO versus ileus Intractable nausea and vomiting secondary to above Patient's symptoms likely secondary to Ogilvie syndrome Chronic constipation also on chronic narcotics NG tube placed on admission NG tube discontinued on 2/13 Nausea and vomiting is improved however patient has had no p.o. intake No flatus or BM reported Still with abdominal pain though I feel this is chronic in nature Plan: GI and general surgery to discuss next diagnostic steps Continue n.p.o. for now Hold oral medications for now IV  antireflux and antiemetics no narcotics Avoid Reglan Surgery and GI follow-up  History of Ogilvie syndrome Laxatives on hold  Possible UTI, felt unlikely Sepsis ruled out Likely that fever, tachycardia, tachypnea driven by partial SBO rather than infection Plan: Defer antibiotics Monitor vitals and fever curve IV fluids  CAD (coronary artery disease): no chest pain -ASA, Zocor   COPD (chronic obstructive pulmonary disease) ([place]): Stable -Bronchodilators   Depression with anxiety -Continue home medications   HLD (hyperlipidemia) -zocor   HTN (hypertension) -IV hydralazine as needed -Continue lisinopril, metoprolol once able to take p.o.   Seizure -Seizure precaution -When necessary Ativan for seizure - change home oral Keppra to IV 1000 mg bid   History of Stroke ([place]) -ASA and zocor   Chronic kidney disease, stage 3a ([place]): stable -f/u by BMP   Tobacco abuse: -Nicotine patch   DVT prophylaxis: SQ heparin Code Status: DNR Family Communication: None today Disposition Plan: Status is: Inpatient Remains inpatient appropriate because: Partial small bowel obstruction.  Pending GI and general surgery follow-up for further diagnostics  Level of care: Telemetry Medical  Consultants:  GI Surgery  Procedures:  NG tube placement  Antimicrobials: None   Subjective: Seen and examined.  Endorses thirst.  Endorses improvement in abdominal pain and distention.  NG tube in place  Objective: Vitals:   10/17/21 1601 10/17/21 1857 10/18/21 0236 10/18/21 0808  BP: (!) 155/89 (!) 155/83 (!) 148/77 (!) 152/77  Pulse: 75 73 76 74  Resp: 16  18   Temp: 99.1 F (37.3 C)  98.6 F (37 C) 97.7 F (36.5 C)  TempSrc: Oral  SpO2: 97% 98% 97% 98%  Weight:        Intake/Output Summary (Last 24 hours) at 10/18/2021 1200 Last data filed at 10/18/2021 0998 Gross per 24 hour  Intake 170.9 ml  Output 350 ml  Net -179.1 ml   Filed Weights   10/16/21 1212  Weight:  97.5 kg    Examination:  General exam: No acute distress Respiratory system: Decreased at bases.  Lungs clear.  Normal work of breathing.  Room air Cardiovascular system: S1-S2, RRR, no murmurs, no pedal edema Gastrointestinal system: Soft, mild distention, mildly tender to palpation Central nervous system: Alert and oriented. No focal neurological deficits. Extremities: Symmetric 5 x 5 power. Skin: No rashes, lesions or ulcers Psychiatry: Judgement and insight appear normal. Mood & affect appropriate.     Data Reviewed: I have personally reviewed following labs and imaging studies  CBC: Recent Labs  Lab 10/16/21 0700 10/17/21 0429  WBC 7.6 6.4  NEUTROABS 5.6  --   HGB 14.3 12.9*  HCT 45.7 40.9  MCV 97.4 97.4  PLT 282 338   Basic Metabolic Panel: Recent Labs  Lab 10/16/21 0700 10/17/21 0429  NA 138 144  K 3.5 4.0  CL 95* 103  CO2 31 28  GLUCOSE 108* 89  BUN 17 17  CREATININE 1.32* 1.18  CALCIUM 9.9 8.8*   GFR: Estimated Creatinine Clearance: 68 mL/min (by C-G formula based on SCr of 1.18 mg/dL). Liver Function Tests: Recent Labs  Lab 10/16/21 0700  AST 20  ALT 20  ALKPHOS 67  BILITOT 0.6  PROT 7.7  ALBUMIN 4.3   Recent Labs  Lab 10/16/21 0700  LIPASE 34   No results for input(s): AMMONIA in the last 168 hours. Coagulation Profile: Recent Labs  Lab 10/16/21 0700  INR 1.0   Cardiac Enzymes: No results for input(s): CKTOTAL, CKMB, CKMBINDEX, TROPONINI in the last 168 hours. BNP (last 3 results) No results for input(s): PROBNP in the last 8760 hours. HbA1C: No results for input(s): HGBA1C in the last 72 hours. CBG: No results for input(s): GLUCAP in the last 168 hours. Lipid Profile: No results for input(s): CHOL, HDL, LDLCALC, TRIG, CHOLHDL, LDLDIRECT in the last 72 hours. Thyroid Function Tests: No results for input(s): TSH, T4TOTAL, FREET4, T3FREE, THYROIDAB in the last 72 hours. Anemia Panel: No results for input(s): VITAMINB12,  FOLATE, FERRITIN, TIBC, IRON, RETICCTPCT in the last 72 hours. Sepsis Labs: Recent Labs  Lab 10/16/21 0700 10/16/21 0743  PROCALCITON  --  <0.10  LATICACIDVEN 1.3  --     Recent Results (from the past 240 hour(s))  Resp Panel by RT-PCR (Flu A&B, Covid) Nasopharyngeal Swab     Status: None   Collection Time: 10/16/21  6:43 AM   Specimen: Nasopharyngeal Swab; Nasopharyngeal(NP) swabs in vial transport medium  Result Value Ref Range Status   SARS Coronavirus 2 by RT PCR NEGATIVE NEGATIVE Final    Comment: (NOTE) SARS-CoV-2 target nucleic acids are NOT DETECTED.  The SARS-CoV-2 RNA is generally detectable in upper respiratory specimens during the acute phase of infection. The lowest concentration of SARS-CoV-2 viral copies this assay can detect is 138 copies/mL. A negative result does not preclude SARS-Cov-2 infection and should not be used as the sole basis for treatment or other patient management decisions. A negative result may occur with  improper specimen collection/handling, submission of specimen other than nasopharyngeal swab, presence of viral mutation(s) within the areas targeted by this assay, and inadequate number of viral copies(<138 copies/mL). A negative  result must be combined with clinical observations, patient history, and epidemiological information. The expected result is Negative.  Fact Sheet for Patients:  EntrepreneurPulse.com.au  Fact Sheet for Healthcare Providers:  IncredibleEmployment.be  This test is no t yet approved or cleared by the [place] FDA and  has been authorized for detection and/or diagnosis of SARS-CoV-2 by FDA under an Emergency Use Authorization (EUA). This EUA will remain  in effect (meaning this test can be used) for the duration of the COVID-19 declaration under Section 564(b)(1) of the Act, 21 U.S.C.section 360bbb-3(b)(1), unless the authorization is terminated  or revoked sooner.        Influenza A by PCR NEGATIVE NEGATIVE Final   Influenza B by PCR NEGATIVE NEGATIVE Final    Comment: (NOTE) The Xpert Xpress SARS-CoV-2/FLU/RSV plus assay is intended as an aid in the diagnosis of influenza from Nasopharyngeal swab specimens and should not be used as a sole basis for treatment. Nasal washings and aspirates are unacceptable for Xpert Xpress SARS-CoV-2/FLU/RSV testing.  Fact Sheet for Patients: EntrepreneurPulse.com.au  Fact Sheet for Healthcare Providers: IncredibleEmployment.be  This test is not yet approved or cleared by the [place] FDA and has been authorized for detection and/or diagnosis of SARS-CoV-2 by FDA under an Emergency Use Authorization (EUA). This EUA will remain in effect (meaning this test can be used) for the duration of the COVID-19 declaration under Section 564(b)(1) of the Act, 21 U.S.C. section 360bbb-3(b)(1), unless the authorization is terminated or revoked.  Performed at [facility], [address] [place], [place] [address]   Blood culture (routine single)     Status: None (Preliminary result)   Collection Time: 10/16/21  7:00 AM   Specimen: BLOOD  Result Value Ref Range Status   Specimen Description BLOOD ARM  Final   Special Requests   Final    BOTTLES DRAWN AEROBIC AND ANAEROBIC Blood Culture adequate volume   Culture   Final    NO GROWTH 2 DAYS Performed at [name], [address], [place], [place] [address]    Report Status PENDING  Incomplete  Urine Culture     Status: Abnormal (Preliminary result)   Collection Time: 10/16/21 10:24 AM   Specimen: In/Out Cath Urine  Result Value Ref Range Status   Specimen Description   Final    IN/OUT CATH URINE Performed at [facility], [address] [place], [place] [address]    Special Requests   Final    NONE Performed at [facility], [place], [place] [address]    Culture 60,000  COLONIES/mL ENTEROCOCCUS FAECALIS (A)  Final   Report Status PENDING  Incomplete  C Difficile Quick Screen w PCR reflex     Status: None   Collection Time: 10/16/21  1:39 PM   Specimen: STOOL  Result Value Ref Range Status   C Diff antigen NEGATIVE NEGATIVE Final   C Diff toxin NEGATIVE NEGATIVE Final   C Diff interpretation No C. difficile detected.  Final    Comment: Performed at [facility], [address] [place], [place] [address]  Gastrointestinal Panel by PCR , Stool     Status: None   Collection Time: 10/16/21  1:39 PM   Specimen: STOOL  Result Value Ref Range Status   Campylobacter species NOT DETECTED NOT DETECTED Final   Plesimonas shigelloides NOT DETECTED NOT DETECTED Final   Salmonella species NOT DETECTED NOT DETECTED Final   Yersinia enterocolitica NOT DETECTED NOT DETECTED Final  Vibrio species NOT DETECTED NOT DETECTED Final   Vibrio cholerae NOT DETECTED NOT DETECTED Final   Enteroaggregative E coli (EAEC) NOT DETECTED NOT DETECTED Final   Enteropathogenic E coli (EPEC) NOT DETECTED NOT DETECTED Final   Enterotoxigenic E coli (ETEC) NOT DETECTED NOT DETECTED Final   Shiga like toxin producing E coli (STEC) NOT DETECTED NOT DETECTED Final   Shigella/Enteroinvasive E coli (EIEC) NOT DETECTED NOT DETECTED Final   Cryptosporidium NOT DETECTED NOT DETECTED Final   Cyclospora cayetanensis NOT DETECTED NOT DETECTED Final   Entamoeba histolytica NOT DETECTED NOT DETECTED Final   Giardia lamblia NOT DETECTED NOT DETECTED Final   Adenovirus F40/41 NOT DETECTED NOT DETECTED Final   Astrovirus NOT DETECTED NOT DETECTED Final   Norovirus GI/GII NOT DETECTED NOT DETECTED Final   Rotavirus A NOT DETECTED NOT DETECTED Final   Sapovirus (I, II, IV, and V) NOT DETECTED NOT DETECTED Final    Comment: Performed at [facility], [address] [place], [place] [address]  MRSA Next Gen by PCR, Nasal     Status: None   Collection Time: 10/17/21  4:15 AM    Specimen: Nasal Mucosa; Nasal Swab  Result Value Ref Range Status   MRSA by PCR Next Gen NOT DETECTED NOT DETECTED Final    Comment: (NOTE) The GeneXpert MRSA Assay (FDA approved for NASAL specimens only), is one component of a comprehensive MRSA colonization surveillance program. It is not intended to diagnose MRSA infection nor to guide or monitor treatment for MRSA infections. Test performance is not FDA approved in patients less than 41 years old. Performed at [facility], [address], [place], [place] [address]          Radiology Studies: DG Abdomen 1 View  Result Date: 10/16/2021 CLINICAL DATA:  Nasogastric tube placement. EXAM: ABDOMEN - 1 VIEW COMPARISON:  08/10/2019. FINDINGS: Nasal/orogastric tube passes below the diaphragm, tip in the proximal stomach, side hole likely above the GE junction. Recommend further insertion for more optimal positioning. Increased bowel gas with no overt bowel dilation. IMPRESSION: 1. Nasal/orogastric tube tip lies in the proximal stomach. Recommend inserting another 10 cm for more optimal positioning. Electronically Signed   By: Lajean Manes M.D.   On: 10/16/2021 15:27        Scheduled Meds:  ALPRAZolam  0.5 mg Oral BID   aspirin  81 mg Oral Daily   B-complex with vitamin C  1 tablet Oral Daily   calcium-vitamin D  1 tablet Oral BID   gabapentin  800 mg Oral TID   heparin  5,000 Units Subcutaneous Q8H   lisinopril  5 mg Oral Daily   metoprolol succinate  25 mg Oral Daily   nicotine  21 mg Transdermal Daily   omega-3 acid ethyl esters  1,000 mg Oral Daily   pantoprazole (PROTONIX) IV  40 mg Intravenous Q24H   polyethylene glycol  17 g Oral Daily   senna  1 tablet Oral QPM   sertraline  50 mg Oral Daily   simvastatin  10 mg Oral QHS   tamsulosin  0.4 mg Oral QPM   tiotropium  18 mcg Inhalation Daily   Continuous Infusions:  sodium chloride 75 mL/hr at 10/18/21 0517   levETIRAcetam 1,000 mg (10/17/21 2336)    methocarbamol (ROBAXIN) IV 500 mg (10/18/21 0254)     LOS: 2 days      Sidney Ace, MD [facility]   If 7PM-7AM, please contact night-coverage  10/18/2021, 12:00 PM

## 2021-10-19 ENCOUNTER — Inpatient Hospital Stay: Payer: Medicare Other

## 2021-10-19 DIAGNOSIS — R109 Unspecified abdominal pain: Secondary | ICD-10-CM | POA: Diagnosis not present

## 2021-10-19 DIAGNOSIS — E785 Hyperlipidemia, unspecified: Secondary | ICD-10-CM

## 2021-10-19 DIAGNOSIS — J449 Chronic obstructive pulmonary disease, unspecified: Secondary | ICD-10-CM | POA: Diagnosis not present

## 2021-10-19 DIAGNOSIS — I2583 Coronary atherosclerosis due to lipid rich plaque: Secondary | ICD-10-CM

## 2021-10-19 DIAGNOSIS — N1831 Chronic kidney disease, stage 3a: Secondary | ICD-10-CM | POA: Diagnosis not present

## 2021-10-19 DIAGNOSIS — I2510 Atherosclerotic heart disease of native coronary artery without angina pectoris: Secondary | ICD-10-CM | POA: Diagnosis not present

## 2021-10-19 LAB — URINE CULTURE: Culture: 60000 — AB

## 2021-10-19 LAB — TROPONIN I (HIGH SENSITIVITY): Troponin I (High Sensitivity): 99 ng/L — ABNORMAL HIGH (ref <18)

## 2021-10-19 MED ORDER — TRAZODONE HCL 50 MG PO TABS
50.0000 mg | ORAL_TABLET | Freq: Every evening | ORAL | Status: DC | PRN
  Administered 2021-10-23 – 2021-10-28 (×3): 50 mg via ORAL
  Filled 2021-10-19 (×3): qty 1

## 2021-10-19 MED ORDER — SENNOSIDES-DOCUSATE SODIUM 8.6-50 MG PO TABS: 1.0000 | ORAL_TABLET | Freq: Every evening | ORAL | Status: DC | PRN

## 2021-10-19 MED ORDER — DEXTROSE-NACL 5-0.9 % IV SOLN: INTRAVENOUS | Status: AC

## 2021-10-19 MED ORDER — DIATRIZOATE MEGLUMINE & SODIUM 66-10 % PO SOLN
90.0000 mL | Freq: Once | ORAL | Status: AC
  Administered 2021-10-19: 90 mL via ORAL

## 2021-10-19 MED ORDER — METOPROLOL TARTRATE 5 MG/5ML IV SOLN: 5.0000 mg | INTRAVENOUS | Status: DC | PRN

## 2021-10-19 MED ORDER — SODIUM CHLORIDE 0.9 % IV SOLN
1.0000 g | Freq: Four times a day (QID) | INTRAVENOUS | Status: DC
  Administered 2021-10-19 – 2021-10-20 (×3): 1 g via INTRAVENOUS
  Filled 2021-10-19: qty 1
  Filled 2021-10-19 (×2): qty 1000
  Filled 2021-10-19: qty 1
  Filled 2021-10-19: qty 1000
  Filled 2021-10-19: qty 1

## 2021-10-19 MED ORDER — CHEWING GUM (ORBIT) SUGAR FREE
1.0000 | CHEWING_GUM | Freq: Three times a day (TID) | ORAL | Status: DC
  Administered 2021-10-19 – 2021-11-02 (×36): 1 via ORAL
  Filled 2021-10-19 (×3): qty 1

## 2021-10-19 MED ORDER — FOSFOMYCIN TROMETHAMINE 3 G PO PACK
3.0000 g | PACK | Freq: Once | ORAL | Status: DC
  Filled 2021-10-19: qty 3

## 2021-10-19 NOTE — Progress Notes (Signed)
GI Inpatient Follow-up Note  Subjective:  Patient seen and overall unchanged. No abdominal pain but is not passing much gas.  Scheduled Inpatient Medications:   ALPRAZolam  0.5 mg Oral BID   aspirin  81 mg Oral Daily   B-complex with vitamin C  1 tablet Oral Daily   calcium-vitamin D  1 tablet Oral BID   gabapentin  800 mg Oral TID   heparin  5,000 Units Subcutaneous Q8H   lisinopril  5 mg Oral Daily   metoprolol succinate  25 mg Oral Daily   nicotine  21 mg Transdermal Daily   omega-3 acid ethyl esters  1,000 mg Oral Daily   pantoprazole (PROTONIX) IV  40 mg Intravenous Q24H   polyethylene glycol  17 g Oral Daily   senna  1 tablet Oral QPM   sertraline  50 mg Oral Daily   simvastatin  10 mg Oral QHS   tamsulosin  0.4 mg Oral QPM   tiotropium  18 mcg Inhalation Daily    Continuous Inpatient Infusions:    ampicillin (OMNIPEN) IV     dextrose 5 % and 0.9% NaCl 75 mL/hr at 10/19/21 1136   levETIRAcetam 1,000 mg (10/19/21 1234)   methocarbamol (ROBAXIN) IV 500 mg (10/19/21 0601)    PRN Inpatient Medications:  acetaminophen, albuterol, alum & mag hydroxide-simeth, dextromethorphan-guaiFENesin, diclofenac Sodium, hydrALAZINE, ketorolac, LORazepam, LORazepam, menthol-cetylpyridinium, metoprolol tartrate, nitroGLYCERIN, nystatin, ondansetron (ZOFRAN) IV, senna-docusate, traZODone  Review of Systems:  Review of Systems  Constitutional:  Negative for chills and fever.  Respiratory:  Negative for shortness of breath.   Cardiovascular:  Positive for chest pain.  Gastrointestinal:  Negative for abdominal pain, blood in stool, diarrhea, melena, nausea and vomiting.  Musculoskeletal:  Positive for joint pain.  Skin:  Negative for itching and rash.  Neurological:  Negative for focal weakness.  Psychiatric/Behavioral:  Negative for substance abuse.   All other systems reviewed and are negative.    Physical Examination: BP (!) 162/98 (BP Location: Right Arm)    Pulse 80    Temp 98.7  F (37.1 C)    Resp 18    Wt 92 kg    SpO2 97%    BMI 27.89 kg/m  Gen: NAD, alert and oriented x 4 HEENT: PEERLA, EOMI, Neck: supple, no JVD or thyromegaly Chest: No respiratory distress Abd: soft, non-tender, non-distended Ext: no edema, well perfused with 2+ pulses, Skin: no rash or lesions noted Lymph: no LAD  Data: Lab Results  Component Value Date   WBC 6.4 10/17/2021   HGB 12.9 (L) 10/17/2021   HCT 40.9 10/17/2021   MCV 97.4 10/17/2021   PLT 208 10/17/2021   Recent Labs  Lab 10/16/21 0700 10/17/21 0429  HGB 14.3 12.9*   Lab Results  Component Value Date   NA 144 10/17/2021   K 4.0 10/17/2021   CL 103 10/17/2021   CO2 28 10/17/2021   BUN 17 10/17/2021   CREATININE 1.18 10/17/2021   Lab Results  Component Value Date   ALT 20 10/16/2021   AST 20 10/16/2021   ALKPHOS 67 10/16/2021   BILITOT 0.6 10/16/2021   Recent Labs  Lab 10/16/21 0700  APTT 33  INR 1.0   Assessment/Plan: 71 y/o gentleman with multiple medical problems including COPD, chronic abdominal pain, CVA's, and history of colon cancer s/p sigmoidectomy here with ileus due to chronic narcotics  Recommendations:  - avoid ALL opioid pain medicines - replete electrolytes - spoke with family and updated on situation - if  ileus is persistent then will need to game plan for potential initiation of TPN which we consider about 7 days out from time of NPO. Patient presented on 2/12 so 2/19 would be 7 days which is a weekend day.     Will follow along, please call with any questions or concerns.   Raylene Miyamoto MD, MPH [place]

## 2021-10-19 NOTE — Plan of Care (Signed)
  Problem: Health Behavior/Discharge Planning: Goal: Ability to manage health-related needs will improve Outcome: Progressing   Problem: Clinical Measurements: Goal: Ability to maintain clinical measurements within normal limits will improve Outcome: Progressing   

## 2021-10-19 NOTE — Progress Notes (Signed)
PROGRESS NOTE    Calvin [name]  FTD:[id number] DOB: May 18, 1951 DOA: 10/16/2021 PCP: Pcp, No   Brief Narrative:  71 y.o. male with medical history significant of Ogilvie's syndrome, ileus, hypertension, hyperlipidemia, COPD, stroke with mild left-sided weakness, GERD, gout, depression with anxiety, seizure, CAD, MI,  alcohol abuse, CKD stage IIIa, dCHF, colon cancer, thyroid cancer, who presents with nausea vomiting, abdominal pain.    Patient is a poor historian, history is limited. Pt states that has been having abdominal pain for 2 days, associated with nausea, nonbilious nonbloody vomiting several times.  He also has abdominal distention.  Per his daughter (I called her daughter by phone), she noticed patient's abdominal distention several days ago.  Patient states that he has diarrhea, but per nurse observation, patient's stools are formed in the ED.  Patient does not have chest pain, cough, shortness of breath.  Patient has fever 100.4 in ED.     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  Initially NG tube placed and thereafter discontinued.  Currently n.p.o. Advance diet as tolerated.  Avoid all opioids at this time and replete and maintain electrolytes as appropriate. -Serial abdominal x-rays. This morning still show marked distention. Getting small bowel follow through.  -GI and general surgery following.   History of Ogilvie syndrome Laxatives on hold   Possible UTI, felt unlikely Sepsis ruled out -Ucx grew Enteroccocus, will give a dose of fosfomycin.    CAD (coronary artery disease) with overnight complaints of chest pain -Continue aspirin and statin -Cardiology consulted by 19   COPD (chronic obstructive pulmonary disease) ([place]): Stable -Bronchodilators as needed   Depression with anxiety -Continue home medications when able to tolerate. Home PO meds - Zoloft and Xanax.     HLD (hyperlipidemia) -Continue statin   HTN (hypertension) -Currently home medications are on hold.  IV hydralazine and Lopressor as needed   Seizure -Seizure precautions.  Currently on IV Keppra   History of Stroke ([place]) -ASA and zocor   Chronic kidney disease, stage 3a ([place]): stable -f/u by BMP   Tobacco abuse: -Nicotine patch      DVT prophylaxis: SQ Heparin Code Status: DNR Family Communication:  Called [name]- no answer. Mail box is full  Status is: Inpatient Remains inpatient appropriate because: Maintain hospital until return of bowel function. GI and gen Sx following.   Subjective: Still have abd discomfort and distention.   Review of Systems Otherwise negative except as per HPI, including: General: Denies fever, chills, night sweats or unintended weight loss. Resp: Denies cough, wheezing, shortness of breath. Cardiac: Denies chest pain, palpitations, orthopnea, paroxysmal nocturnal dyspnea. GI: Denies  diarrhea or constipation GU: Denies dysuria, frequency, hesitancy or incontinence MS: Denies muscle aches, joint pain or swelling Neuro: Denies headache, neurologic deficits (focal weakness, numbness, tingling), abnormal gait Psych: Denies anxiety, depression, SI/HI/AVH Skin: Denies new rashes or lesions ID: Denies sick contacts, exotic exposures, travel  Examination:  General exam: Appears calm and comfortable  Respiratory system: Clear to auscultation. Respiratory effort normal. Cardiovascular system: S1 & S2 heard, RRR. No JVD, murmurs, rubs, gallops or clicks. No pedal edema. Gastrointestinal system: Abdomen is distended,tender. No organomegaly or masses felt. No bowel sounds heard. Central nervous system: Alert and oriented. No focal neurological deficits. Extremities: Symmetric 5 x 5 power. Skin: No rashes, lesions or ulcers Psychiatry: Judgement and insight appear normal. Mood & affect appropriate.     Objective: Vitals:  10/18/21 2000  10/19/21 0458 10/19/21 0459 10/19/21 0731  BP: (!) 162/92 (!) 155/98  (!) 162/98  Pulse: 85 81  80  Resp: 20 18    Temp: 98.2 F (36.8 C) 98.4 F (36.9 C)  98.7 F (37.1 C)  TempSrc: Oral Oral    SpO2: 100% 98%  97%  Weight:   92 kg     Intake/Output Summary (Last 24 hours) at 10/19/2021 0912 Last data filed at 10/19/2021 0100 Gross per 24 hour  Intake 100 ml  Output 2200 ml  Net -2100 ml   Filed Weights   10/16/21 1212 10/19/21 0459  Weight: 97.5 kg 92 kg     Data Reviewed:   CBC: Recent Labs  Lab 10/16/21 0700 10/17/21 0429  WBC 7.6 6.4  NEUTROABS 5.6  --   HGB 14.3 12.9*  HCT 45.7 40.9  MCV 97.4 97.4  PLT 282 517   Basic Metabolic Panel: Recent Labs  Lab 10/16/21 0700 10/17/21 0429  NA 138 144  K 3.5 4.0  CL 95* 103  CO2 31 28  GLUCOSE 108* 89  BUN 17 17  CREATININE 1.32* 1.18  CALCIUM 9.9 8.8*   GFR: Estimated Creatinine Clearance: 66.2 mL/min (by C-G formula based on SCr of 1.18 mg/dL). Liver Function Tests: Recent Labs  Lab 10/16/21 0700  AST 20  ALT 20  ALKPHOS 67  BILITOT 0.6  PROT 7.7  ALBUMIN 4.3   Recent Labs  Lab 10/16/21 0700  LIPASE 34   No results for input(s): AMMONIA in the last 168 hours. Coagulation Profile: Recent Labs  Lab 10/16/21 0700  INR 1.0   Cardiac Enzymes: No results for input(s): CKTOTAL, CKMB, CKMBINDEX, TROPONINI in the last 168 hours. BNP (last 3 results) No results for input(s): PROBNP in the last 8760 hours. HbA1C: No results for input(s): HGBA1C in the last 72 hours. CBG: No results for input(s): GLUCAP in the last 168 hours. Lipid Profile: No results for input(s): CHOL, HDL, LDLCALC, TRIG, CHOLHDL, LDLDIRECT in the last 72 hours. Thyroid Function Tests: No results for input(s): TSH, T4TOTAL, FREET4, T3FREE, THYROIDAB in the last 72 hours. Anemia Panel: No results for input(s): VITAMINB12, FOLATE, FERRITIN, TIBC, IRON, RETICCTPCT in the last 72 hours. Sepsis Labs: Recent Labs  Lab  10/16/21 0700 10/16/21 0743  PROCALCITON  --  <0.10  LATICACIDVEN 1.3  --     Recent Results (from the past 240 hour(s))  Resp Panel by RT-PCR (Flu A&B, Covid) Nasopharyngeal Swab     Status: None   Collection Time: 10/16/21  6:43 AM   Specimen: Nasopharyngeal Swab; Nasopharyngeal(NP) swabs in vial transport medium  Result Value Ref Range Status   SARS Coronavirus 2 by RT PCR NEGATIVE NEGATIVE Final    Comment: (NOTE) SARS-CoV-2 target nucleic acids are NOT DETECTED.  The SARS-CoV-2 RNA is generally detectable in upper respiratory specimens during the acute phase of infection. The lowest concentration of SARS-CoV-2 viral copies this assay can detect is 138 copies/mL. A negative result does not preclude SARS-Cov-2 infection and should not be used as the sole basis for treatment or other patient management decisions. A negative result may occur with  improper specimen collection/handling, submission of specimen other than nasopharyngeal swab, presence of viral mutation(s) within the areas targeted by this assay, and inadequate number of viral copies(<138 copies/mL). A negative result must be combined with clinical observations, patient history, and epidemiological information. The expected result is Negative.  Fact Sheet for Patients:  EntrepreneurPulse.com.au  Fact Sheet for  Healthcare Providers:  IncredibleEmployment.be  This test is no t yet approved or cleared by the [place] and  has been authorized for detection and/or diagnosis of SARS-CoV-2 by FDA under an Emergency Use Authorization (EUA). This EUA will remain  in effect (meaning this test can be used) for the duration of the COVID-19 declaration under Section 564(b)(1) of the Act, 21 U.S.C.section 360bbb-3(b)(1), unless the authorization is terminated  or revoked sooner.       Influenza A by PCR NEGATIVE NEGATIVE Final   Influenza B by PCR NEGATIVE NEGATIVE Final     Comment: (NOTE) The Xpert Xpress SARS-CoV-2/FLU/RSV plus assay is intended as an aid in the diagnosis of influenza from Nasopharyngeal swab specimens and should not be used as a sole basis for treatment. Nasal washings and aspirates are unacceptable for Xpert Xpress SARS-CoV-2/FLU/RSV testing.  Fact Sheet for Patients: EntrepreneurPulse.com.au  Fact Sheet for Healthcare Providers: IncredibleEmployment.be  This test is not yet approved or cleared by the [place] FDA and has been authorized for detection and/or diagnosis of SARS-CoV-2 by FDA under an Emergency Use Authorization (EUA). This EUA will remain in effect (meaning this test can be used) for the duration of the COVID-19 declaration under Section 564(b)(1) of the Act, 21 U.S.C. section 360bbb-3(b)(1), unless the authorization is terminated or revoked.  Performed at [facility], [address] [place], [place] [address]   Blood culture (routine single)     Status: None (Preliminary result)   Collection Time: 10/16/21  7:00 AM   Specimen: BLOOD  Result Value Ref Range Status   Specimen Description BLOOD ARM  Final   Special Requests   Final    BOTTLES DRAWN AEROBIC AND ANAEROBIC Blood Culture adequate volume   Culture   Final    NO GROWTH 2 DAYS Performed at [facility], [address], [place], [place] [address]    Report Status PENDING  Incomplete  Urine Culture     Status: Abnormal   Collection Time: 10/16/21 10:24 AM   Specimen: In/Out Cath Urine  Result Value Ref Range Status   Specimen Description   Final    IN/OUT CATH URINE Performed at [facility], [address], [place], [place] [address]    Special Requests   Final    NONE Performed at [facility], [address] [place], [place] [address]    Culture 60,000 COLONIES/mL ENTEROCOCCUS FAECALIS (A)  Final   Report Status 10/19/2021 FINAL  Final   Organism ID, Bacteria  ENTEROCOCCUS FAECALIS (A)  Final      Susceptibility   Enterococcus faecalis - MIC*    AMPICILLIN <=2 SENSITIVE Sensitive     NITROFURANTOIN <=16 SENSITIVE Sensitive     VANCOMYCIN 1 SENSITIVE Sensitive     * 60,000 COLONIES/mL ENTEROCOCCUS FAECALIS  C Difficile Quick Screen w PCR reflex     Status: None   Collection Time: 10/16/21  1:39 PM   Specimen: STOOL  Result Value Ref Range Status   C Diff antigen NEGATIVE NEGATIVE Final   C Diff toxin NEGATIVE NEGATIVE Final   C Diff interpretation No C. difficile detected.  Final    Comment: Performed at [facility], [address] [place], [place] [address]  Gastrointestinal Panel by PCR , Stool     Status: None   Collection Time: 10/16/21  1:39 PM   Specimen: STOOL  Result Value Ref Range Status   Campylobacter species NOT DETECTED NOT DETECTED Final  Plesimonas shigelloides NOT DETECTED NOT DETECTED Final   Salmonella species NOT DETECTED NOT DETECTED Final   Yersinia enterocolitica NOT DETECTED NOT DETECTED Final   Vibrio species NOT DETECTED NOT DETECTED Final   Vibrio cholerae NOT DETECTED NOT DETECTED Final   Enteroaggregative E coli (EAEC) NOT DETECTED NOT DETECTED Final   Enteropathogenic E coli (EPEC) NOT DETECTED NOT DETECTED Final   Enterotoxigenic E coli (ETEC) NOT DETECTED NOT DETECTED Final   Shiga like toxin producing E coli (STEC) NOT DETECTED NOT DETECTED Final   Shigella/Enteroinvasive E coli (EIEC) NOT DETECTED NOT DETECTED Final   Cryptosporidium NOT DETECTED NOT DETECTED Final   Cyclospora cayetanensis NOT DETECTED NOT DETECTED Final   Entamoeba histolytica NOT DETECTED NOT DETECTED Final   Giardia lamblia NOT DETECTED NOT DETECTED Final   Adenovirus F40/41 NOT DETECTED NOT DETECTED Final   Astrovirus NOT DETECTED NOT DETECTED Final   Norovirus GI/GII NOT DETECTED NOT DETECTED Final   Rotavirus A NOT DETECTED NOT DETECTED Final   Sapovirus (I, II, IV, and V) NOT DETECTED NOT DETECTED Final     Comment: Performed at [facility], [address] [place], [place] [address]  MRSA Next Gen by PCR, Nasal     Status: None   Collection Time: 10/17/21  4:15 AM   Specimen: Nasal Mucosa; Nasal Swab  Result Value Ref Range Status   MRSA by PCR Next Gen NOT DETECTED NOT DETECTED Final    Comment: (NOTE) The GeneXpert MRSA Assay (FDA approved for NASAL specimens only), is one component of a comprehensive MRSA colonization surveillance program. It is not intended to diagnose MRSA infection nor to guide or monitor treatment for MRSA infections. Test performance is not FDA approved in patients less than 21 years old. Performed at [facility], [address] [place], [place] [address]          Radiology Studies: DG Abd 1 View  Result Date: 10/19/2021 CLINICAL DATA:  Abdominal pain.  Ileus. EXAM: ABDOMEN - 1 VIEW COMPARISON:  None. FINDINGS: There is marked gaseous distension of multiple loops of the colon with index loop of the splenic flexure of the colon measuring at least 11 cm in diameter. No significant gaseous distension of the small bowel. No supine evidence of pneumoperitoneum. No pneumatosis or portal venous gas. Limited visualization of the lower thorax demonstrates mild elevation right hemidiaphragm. Vascular calcifications overlie the lower pelvis bilaterally. Post left bipolar hip replacement, incompletely evaluated. Degenerative change the lower lumbar spine is suspected though incompletely evaluated. IMPRESSION: Marked distension of the colon most suggestive of provided clinical suspicion of ileus. Electronically Signed   By: Sandi Mariscal M.D.   On: 10/19/2021 08:39   DG Abd 1 View  Result Date: 10/18/2021 CLINICAL DATA:  Abdominal pain EXAM: ABDOMEN - 1 VIEW COMPARISON:  10/16/2021 FINDINGS: Two supine views of the abdomen and pelvis. Right hemidiaphragm elevation. Removal of nasogastric tube. Cardiomegaly. Gas-filled colon. Minimal distal gas. This may  obscure dilated small bowel loops. No gross free intraperitoneal air. No abnormal abdominal calcifications. IMPRESSION: Gas-filled colon, suggesting adynamic ileus versus less likely distal obstruction. Electronically Signed   By: Abigail Miyamoto M.D.   On: 10/18/2021 18:15        Scheduled Meds:  ALPRAZolam  0.5 mg Oral BID   aspirin  81 mg Oral Daily   B-complex with vitamin C  1 tablet Oral Daily   calcium-vitamin D  1 tablet Oral BID   gabapentin  800 mg Oral TID  heparin  5,000 Units Subcutaneous Q8H   lisinopril  5 mg Oral Daily   metoprolol succinate  25 mg Oral Daily   nicotine  21 mg Transdermal Daily   omega-3 acid ethyl esters  1,000 mg Oral Daily   pantoprazole (PROTONIX) IV  40 mg Intravenous Q24H   polyethylene glycol  17 g Oral Daily   senna  1 tablet Oral QPM   sertraline  50 mg Oral Daily   simvastatin  10 mg Oral QHS   tamsulosin  0.4 mg Oral QPM   tiotropium  18 mcg Inhalation Daily   Continuous Infusions:  sodium chloride 75 mL/hr at 10/18/21 2005   levETIRAcetam 1,000 mg (10/19/21 0027)   methocarbamol (ROBAXIN) IV 500 mg (10/19/21 0601)     LOS: 3 days   Time spent= 35 mins    Breanda Greenlaw Arsenio Loader, MD [facility]  If 7PM-7AM, please contact night-coverage  10/19/2021, 9:12 AM

## 2021-10-19 NOTE — Care Management Important Message (Signed)
Important Message  Patient Details  Name: Calvin [name] MRN: [id number] Date of Birth: 1951-03-11   Medicare Important Message Given:  Yes     Dannette Barbara 10/19/2021, 10:56 AM

## 2021-10-20 DIAGNOSIS — I2510 Atherosclerotic heart disease of native coronary artery without angina pectoris: Secondary | ICD-10-CM | POA: Diagnosis not present

## 2021-10-20 DIAGNOSIS — R109 Unspecified abdominal pain: Secondary | ICD-10-CM | POA: Diagnosis not present

## 2021-10-20 DIAGNOSIS — J449 Chronic obstructive pulmonary disease, unspecified: Secondary | ICD-10-CM | POA: Diagnosis not present

## 2021-10-20 DIAGNOSIS — I10 Essential (primary) hypertension: Secondary | ICD-10-CM | POA: Diagnosis not present

## 2021-10-20 LAB — CBC
HCT: 38.8 % — ABNORMAL LOW (ref 39.0–52.0)
Hemoglobin: 12.5 g/dL — ABNORMAL LOW (ref 13.0–17.0)
MCH: 30.2 pg (ref 26.0–34.0)
MCHC: 32.2 g/dL (ref 30.0–36.0)
MCV: 93.7 fL (ref 80.0–100.0)
Platelets: 180 10*3/uL (ref 150–400)
RBC: 4.14 MIL/uL — ABNORMAL LOW (ref 4.22–5.81)
RDW: 12.3 % (ref 11.5–15.5)
WBC: 4.4 10*3/uL (ref 4.0–10.5)
nRBC: 0 % (ref 0.0–0.2)

## 2021-10-20 LAB — GLUCOSE, CAPILLARY
Glucose-Capillary: 91 mg/dL (ref 70–99)
Glucose-Capillary: 94 mg/dL (ref 70–99)
Glucose-Capillary: 98 mg/dL (ref 70–99)
Glucose-Capillary: 99 mg/dL (ref 70–99)

## 2021-10-20 LAB — BASIC METABOLIC PANEL
Anion gap: 9 (ref 5–15)
BUN: 15 mg/dL (ref 8–23)
CO2: 21 mmol/L — ABNORMAL LOW (ref 22–32)
Calcium: 8.2 mg/dL — ABNORMAL LOW (ref 8.9–10.3)
Chloride: 112 mmol/L — ABNORMAL HIGH (ref 98–111)
Creatinine, Ser: 1.01 mg/dL (ref 0.61–1.24)
GFR, Estimated: >60 mL/min (ref >60)
Glucose, Bld: 98 mg/dL (ref 70–99)
Potassium: 3.6 mmol/L (ref 3.5–5.1)
Sodium: 142 mmol/L (ref 135–145)

## 2021-10-20 LAB — MAGNESIUM: Magnesium: 2.1 mg/dL (ref 1.7–2.4)

## 2021-10-20 MED ORDER — POTASSIUM CHLORIDE 10 MEQ/100ML IV SOLN
10.0000 meq | INTRAVENOUS | Status: AC
  Administered 2021-10-20 (×3): 10 meq via INTRAVENOUS
  Filled 2021-10-20 (×3): qty 100

## 2021-10-20 MED ORDER — SODIUM CHLORIDE 0.9 % IV SOLN
1.0000 g | Freq: Four times a day (QID) | INTRAVENOUS | Status: AC
  Administered 2021-10-20 – 2021-10-25 (×22): 1 g via INTRAVENOUS
  Filled 2021-10-20: qty 1
  Filled 2021-10-20 (×2): qty 1000
  Filled 2021-10-20 (×7): qty 1
  Filled 2021-10-20 (×2): qty 1000
  Filled 2021-10-20 (×3): qty 1
  Filled 2021-10-20: qty 1000
  Filled 2021-10-20: qty 1
  Filled 2021-10-20: qty 1000
  Filled 2021-10-20: qty 1
  Filled 2021-10-20: qty 1000
  Filled 2021-10-20 (×2): qty 1
  Filled 2021-10-20: qty 1000
  Filled 2021-10-20 (×2): qty 1

## 2021-10-20 MED ORDER — POTASSIUM CHLORIDE 10 MEQ/100ML IV SOLN
10.0000 meq | Freq: Once | INTRAVENOUS | Status: AC
  Administered 2021-10-20: 10 meq via INTRAVENOUS
  Filled 2021-10-20: qty 100

## 2021-10-20 NOTE — Progress Notes (Signed)
Mobility Specialist - Progress Note   10/20/21 1400  Mobility  Activity Ambulated with assistance in room  Level of Assistance Minimal assist, patient does 75% or more  Assistive Device Front wheel walker  Distance Ambulated (ft) 20 ft  Activity Response Tolerated well  $Mobility charge 1 Mobility    During mobility: 100 HR  Patient supine upon arrival utilizing RA. Pt sat EOB with ModA and stood at bedside with MinA from elevated bed height. Pt ambulated 47ft with supervision voicing no complaints. Pt returned supine with LE support. Pt able to roll L with MinA,  rolled to right with MaxA. Pt left with needs in reach and alarm set.  Kathee Delton Mobility Specialist 10/20/21, 4:01 PM

## 2021-10-20 NOTE — Progress Notes (Signed)
Subjective:  CC: Calvin [name] is a 71 y.o. male  Hospital stay day 4,   abdominal pain  HPI: No acute issues reported by patient or staff.  No BM.  ROS:  General: Denies weight loss, weight gain, fatigue, fevers, chills, and night sweats. Heart: Denies chest pain, palpitations, racing heart, irregular heartbeat, leg pain or swelling, and decreased activity tolerance. Respiratory: Denies breathing difficulty, shortness of breath, wheezing, cough, and sputum. GI: Denies change in appetite, heartburn, nausea, vomiting, constipation, diarrhea, and blood in stool. GU: Denies difficulty urinating, pain with urinating, urgency, frequency, blood in urine.   Objective:   Temp:  [97.9 F (36.6 C)-98.9 F (37.2 C)] 98.2 F (36.8 C) (02/16 0803) Pulse Rate:  [81-93] 84 (02/16 0803) Resp:  [16-20] 16 (02/16 0803) BP: (149-165)/(88-101) 152/88 (02/16 0803) SpO2:  [98 %] 98 % (02/16 0803) Weight:  [94 kg] 94 kg (02/16 0440)       Weight: 94 kg BMI (Calculated): 28.5   Intake/Output this shift:   Intake/Output Summary (Last 24 hours) at 10/20/2021 1231 Last data filed at 10/20/2021 0805 Gross per 24 hour  Intake 2075.52 ml  Output 1400 ml  Net 675.52 ml    Constitutional :  alert, cooperative, appears stated age, and no distress  Respiratory:  clear to auscultation bilaterally  Cardiovascular:  regular rate and rhythm  Gastrointestinal: Soft, distention has improved, with no focal TTP  Skin: Cool and moist.   Psychiatric: Normal affect, non-agitated, not confused       LABS:  CMP Latest Ref Rng & Units 10/20/2021 10/17/2021 10/16/2021  Glucose 70 - 99 mg/dL 98 89 108(H)  BUN 8 - 23 mg/dL 15 17 17   Creatinine 0.61 - 1.24 mg/dL 1.01 1.18 1.32(H)  Sodium 135 - 145 mmol/L 142 144 138  Potassium 3.5 - 5.1 mmol/L 3.6 4.0 3.5  Chloride 98 - 111 mmol/L 112(H) 103 95(L)  CO2 22 - 32 mmol/L 21(L) 28 31  Calcium 8.9 - 10.3 mg/dL 8.2(L) 8.8(L) 9.9  Total Protein 6.5 - 8.1 g/dL - - 7.7  Total  Bilirubin 0.3 - 1.2 mg/dL - - 0.6  Alkaline Phos 38 - 126 U/L - - 67  AST 15 - 41 U/L - - 20  ALT 0 - 44 U/L - - 20   CBC Latest Ref Rng & Units 10/20/2021 10/17/2021 10/16/2021  WBC 4.0 - 10.5 K/uL 4.4 6.4 7.6  Hemoglobin 13.0 - 17.0 g/dL 12.5(L) 12.9(L) 14.3  Hematocrit 39.0 - 52.0 % 38.8(L) 40.9 45.7  Platelets 150 - 400 K/uL 180 208 282    RADS: CLINICAL DATA:  SBO, 8 hr delay films   EXAM: PORTABLE ABDOMEN - 1 VIEW   COMPARISON:  X-ray abdomen 10/18/2021.   FINDINGS: PO contrast reaches the rectum. Persistent marked gaseous dilatation of the colon. No radio-opaque calculi or other significant radiographic abnormality are seen.   IMPRESSION: PO contrast reaches the rectum. Persistent marked gaseous dilatation of the colon.     Electronically Signed   By: Iven Finn M.D.   On: 10/19/2021 20:50   Assessment:   pSBO.  Distention decreased compared to prior exam and contrast passing into rectum.  Still no BM or flatus reported but patient not most reliable reporting.  May consider clear trial?  Will discuss with GI.  labs/images/medications/previous chart entries reviewed personally and relevant changes/updates noted above.

## 2021-10-20 NOTE — Plan of Care (Signed)

## 2021-10-20 NOTE — Progress Notes (Signed)
PROGRESS NOTE    Calvin [name]  ACZ:[id number] DOB: 1950-09-25 DOA: 10/16/2021 PCP: Pcp, No   Brief Narrative:  71 y.o. male with medical history significant of Ogilvie's syndrome, ileus, hypertension, hyperlipidemia, COPD, stroke with mild left-sided weakness, GERD, gout, depression with anxiety, seizure, CAD, MI,  alcohol abuse, CKD stage IIIa, dCHF, colon cancer, thyroid cancer, who presents with nausea vomiting, abdominal pain.    Patient is a poor historian, history is limited. Pt states that has been having abdominal pain for 2 days, associated with nausea, nonbilious nonbloody vomiting several times.  He also has abdominal distention.  Per his daughter (I called her daughter by phone), she noticed patient's abdominal distention several days ago.  Patient states that he has diarrhea, but per nurse observation, patient's stools are formed in the ED.  Patient does not have chest pain, cough, shortness of breath.  Patient has fever 100.4 in ED.     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  NPO right now, Follow through XR showed contrast in the rectum. I would prefer to do some clear trial if he can tolerate it.  -GI and general surgery following- they are to discuss the case.  GI considering TPN if no return of bowel fx by 2/19   History of Ogilvie syndrome Laxatives on hold   Possible UTI, felt unlikely Sepsis ruled out -Ucx grew Enteroccocus, On Ampicillin.    CAD (coronary artery disease) with overnight complaints of chest pain -Continue aspirin and statin -Cardiology consulted by 19   COPD (chronic obstructive pulmonary disease) ([place]): Stable -Bronchodilators as needed   Depression with anxiety -Continue home medications when able to tolerate. Home PO meds - Zoloft and Xanax.    HLD (hyperlipidemia) -Continue statin   HTN (hypertension) -Currently home medications are  on hold.  IV hydralazine and Lopressor as needed   Seizure -Seizure precautions.  Currently on IV Keppra   History of Stroke ([place]) -ASA and zocor   Chronic kidney disease, stage 3a ([place]): stable -f/u by BMP   Tobacco abuse: -Nicotine patch      DVT prophylaxis: SQ Heparin Code Status: DNR Family Communication:  [name] updated today   Status is: Inpatient Remains inpatient appropriate because: Maintain hospital until return of bowel function. GI and gen Sx following.   Subjective: No BM in last 24 hrs, abd is still distended.   Examination:  Constitutional: Not in acute distress Respiratory: Clear to auscultation bilaterally Cardiovascular: Normal sinus rhythm, no rubs Abdomen: abd distended, diminished BS Musculoskeletal: No edema noted Skin: No rashes seen Neurologic: CN 2-12 grossly intact.  And nonfocal Psychiatric: Normal judgment and insight. Alert and oriented x 3. Normal mood.     Objective: Vitals:   10/19/21 2024 10/20/21 0440 10/20/21 0528 10/20/21 0803  BP: (!) 165/100  (!) 149/90 (!) 152/88  Pulse: 93  81 84  Resp: 20  16 16   Temp: 98.9 F (37.2 C)  98.2 F (36.8 C) 98.2 F (36.8 C)  TempSrc:    Oral  SpO2: 98%  98% 98%  Weight:  94 kg      Intake/Output Summary (Last 24 hours) at 10/20/2021 1234 Last data filed at 10/20/2021 0805 Gross per 24 hour  Intake 2075.52 ml  Output 1400 ml  Net 675.52 ml   Filed Weights   10/16/21 1212 10/19/21 0459 10/20/21 0440  Weight: 97.5 kg 92 kg 94 kg  Data Reviewed:   CBC: Recent Labs  Lab 10/16/21 0700 10/17/21 0429 10/20/21 0553  WBC 7.6 6.4 4.4  NEUTROABS 5.6  --   --   HGB 14.3 12.9* 12.5*  HCT 45.7 40.9 38.8*  MCV 97.4 97.4 93.7  PLT 282 208 737   Basic Metabolic Panel: Recent Labs  Lab 10/16/21 0700 10/17/21 0429 10/20/21 0553  NA 138 144 142  K 3.5 4.0 3.6  CL 95* 103 112*  CO2 31 28 21*  GLUCOSE 108* 89 98  BUN 17 17 15   CREATININE 1.32* 1.18 1.01  CALCIUM 9.9  8.8* 8.2*  MG  --   --  2.1   GFR: Estimated Creatinine Clearance: 78.1 mL/min (by C-G formula based on SCr of 1.01 mg/dL). Liver Function Tests: Recent Labs  Lab 10/16/21 0700  AST 20  ALT 20  ALKPHOS 67  BILITOT 0.6  PROT 7.7  ALBUMIN 4.3   Recent Labs  Lab 10/16/21 0700  LIPASE 34   No results for input(s): AMMONIA in the last 168 hours. Coagulation Profile: Recent Labs  Lab 10/16/21 0700  INR 1.0   Cardiac Enzymes: No results for input(s): CKTOTAL, CKMB, CKMBINDEX, TROPONINI in the last 168 hours. BNP (last 3 results) No results for input(s): PROBNP in the last 8760 hours. HbA1C: No results for input(s): HGBA1C in the last 72 hours. CBG: Recent Labs  Lab 10/20/21 0007 10/20/21 0556  GLUCAP 99 98   Lipid Profile: No results for input(s): CHOL, HDL, LDLCALC, TRIG, CHOLHDL, LDLDIRECT in the last 72 hours. Thyroid Function Tests: No results for input(s): TSH, T4TOTAL, FREET4, T3FREE, THYROIDAB in the last 72 hours. Anemia Panel: No results for input(s): VITAMINB12, FOLATE, FERRITIN, TIBC, IRON, RETICCTPCT in the last 72 hours. Sepsis Labs: Recent Labs  Lab 10/16/21 0700 10/16/21 0743  PROCALCITON  --  <0.10  LATICACIDVEN 1.3  --     Recent Results (from the past 240 hour(s))  Resp Panel by RT-PCR (Flu A&B, Covid) Nasopharyngeal Swab     Status: None   Collection Time: 10/16/21  6:43 AM   Specimen: Nasopharyngeal Swab; Nasopharyngeal(NP) swabs in vial transport medium  Result Value Ref Range Status   SARS Coronavirus 2 by RT PCR NEGATIVE NEGATIVE Final    Comment: (NOTE) SARS-CoV-2 target nucleic acids are NOT DETECTED.  The SARS-CoV-2 RNA is generally detectable in upper respiratory specimens during the acute phase of infection. The lowest concentration of SARS-CoV-2 viral copies this assay can detect is 138 copies/mL. A negative result does not preclude SARS-Cov-2 infection and should not be used as the sole basis for treatment or other patient  management decisions. A negative result may occur with  improper specimen collection/handling, submission of specimen other than nasopharyngeal swab, presence of viral mutation(s) within the areas targeted by this assay, and inadequate number of viral copies(<138 copies/mL). A negative result must be combined with clinical observations, patient history, and epidemiological information. The expected result is Negative.  Fact Sheet for Patients:  EntrepreneurPulse.com.au  Fact Sheet for Healthcare Providers:  IncredibleEmployment.be  This test is no t yet approved or cleared by the [place] FDA and  has been authorized for detection and/or diagnosis of SARS-CoV-2 by FDA under an Emergency Use Authorization (EUA). This EUA will remain  in effect (meaning this test can be used) for the duration of the COVID-19 declaration under Section 564(b)(1) of the Act, 21 U.S.C.section 360bbb-3(b)(1), unless the authorization is terminated  or revoked sooner.       Influenza  A by PCR NEGATIVE NEGATIVE Final   Influenza B by PCR NEGATIVE NEGATIVE Final    Comment: (NOTE) The Xpert Xpress SARS-CoV-2/FLU/RSV plus assay is intended as an aid in the diagnosis of influenza from Nasopharyngeal swab specimens and should not be used as a sole basis for treatment. Nasal washings and aspirates are unacceptable for Xpert Xpress SARS-CoV-2/FLU/RSV testing.  Fact Sheet for Patients: EntrepreneurPulse.com.au  Fact Sheet for Healthcare Providers: IncredibleEmployment.be  This test is not yet approved or cleared by the [place] FDA and has been authorized for detection and/or diagnosis of SARS-CoV-2 by FDA under an Emergency Use Authorization (EUA). This EUA will remain in effect (meaning this test can be used) for the duration of the COVID-19 declaration under Section 564(b)(1) of the Act, 21 U.S.C. section 360bbb-3(b)(1),  unless the authorization is terminated or revoked.  Performed at [facility], [address] [place], [place] [address]   Blood culture (routine single)     Status: None (Preliminary result)   Collection Time: 10/16/21  7:00 AM   Specimen: BLOOD  Result Value Ref Range Status   Specimen Description BLOOD ARM  Final   Special Requests   Final    BOTTLES DRAWN AEROBIC AND ANAEROBIC Blood Culture adequate volume   Culture   Final    NO GROWTH 4 DAYS Performed at [facility], [address] [place], [place] [address]    Report Status PENDING  Incomplete  Urine Culture     Status: Abnormal   Collection Time: 10/16/21 10:24 AM   Specimen: In/Out Cath Urine  Result Value Ref Range Status   Specimen Description   Final    IN/OUT CATH URINE Performed at [facility], [address] [place], [place] [address]    Special Requests   Final    NONE Performed at [facility], [address] [place], [place] [address]    Culture 60,000 COLONIES/mL ENTEROCOCCUS FAECALIS (A)  Final   Report Status 10/19/2021 FINAL  Final   Organism ID, Bacteria ENTEROCOCCUS FAECALIS (A)  Final      Susceptibility   Enterococcus faecalis - MIC*    AMPICILLIN <=2 SENSITIVE Sensitive     NITROFURANTOIN <=16 SENSITIVE Sensitive     VANCOMYCIN 1 SENSITIVE Sensitive     * 60,000 COLONIES/mL ENTEROCOCCUS FAECALIS  C Difficile Quick Screen w PCR reflex     Status: None   Collection Time: 10/16/21  1:39 PM   Specimen: STOOL  Result Value Ref Range Status   C Diff antigen NEGATIVE NEGATIVE Final   C Diff toxin NEGATIVE NEGATIVE Final   C Diff interpretation No C. difficile detected.  Final    Comment: Performed at [facility], [address] [place], [place] [address]  Gastrointestinal Panel by PCR , Stool     Status: None   Collection Time: 10/16/21  1:39 PM   Specimen: STOOL  Result Value Ref Range Status   Campylobacter species NOT DETECTED NOT  DETECTED Final   Plesimonas shigelloides NOT DETECTED NOT DETECTED Final   Salmonella species NOT DETECTED NOT DETECTED Final   Yersinia enterocolitica NOT DETECTED NOT DETECTED Final   Vibrio species NOT DETECTED NOT DETECTED Final   Vibrio cholerae NOT DETECTED NOT DETECTED Final   Enteroaggregative E coli (EAEC) NOT DETECTED NOT DETECTED Final   Enteropathogenic E coli (EPEC) NOT DETECTED NOT DETECTED Final   Enterotoxigenic E coli (ETEC) NOT DETECTED NOT DETECTED Final   Shiga like toxin producing E  coli (STEC) NOT DETECTED NOT DETECTED Final   Shigella/Enteroinvasive E coli (EIEC) NOT DETECTED NOT DETECTED Final   Cryptosporidium NOT DETECTED NOT DETECTED Final   Cyclospora cayetanensis NOT DETECTED NOT DETECTED Final   Entamoeba histolytica NOT DETECTED NOT DETECTED Final   Giardia lamblia NOT DETECTED NOT DETECTED Final   Adenovirus F40/41 NOT DETECTED NOT DETECTED Final   Astrovirus NOT DETECTED NOT DETECTED Final   Norovirus GI/GII NOT DETECTED NOT DETECTED Final   Rotavirus A NOT DETECTED NOT DETECTED Final   Sapovirus (I, II, IV, and V) NOT DETECTED NOT DETECTED Final    Comment: Performed at [facility], [address] [place], [place] [address]  MRSA Next Gen by PCR, Nasal     Status: None   Collection Time: 10/17/21  4:15 AM   Specimen: Nasal Mucosa; Nasal Swab  Result Value Ref Range Status   MRSA by PCR Next Gen NOT DETECTED NOT DETECTED Final    Comment: (NOTE) The GeneXpert MRSA Assay (FDA approved for NASAL specimens only), is one component of a comprehensive MRSA colonization surveillance program. It is not intended to diagnose MRSA infection nor to guide or monitor treatment for MRSA infections. Test performance is not FDA approved in patients less than 49 years old. Performed at [facility], [address], [place], [place] [address]          Radiology Studies: DG Abd 1 View  Result Date: 10/19/2021 CLINICAL DATA:  Abdominal  pain.  Ileus. EXAM: ABDOMEN - 1 VIEW COMPARISON:  None. FINDINGS: There is marked gaseous distension of multiple loops of the colon with index loop of the splenic flexure of the colon measuring at least 11 cm in diameter. No significant gaseous distension of the small bowel. No supine evidence of pneumoperitoneum. No pneumatosis or portal venous gas. Limited visualization of the lower thorax demonstrates mild elevation right hemidiaphragm. Vascular calcifications overlie the lower pelvis bilaterally. Post left bipolar hip replacement, incompletely evaluated. Degenerative change the lower lumbar spine is suspected though incompletely evaluated. IMPRESSION: Marked distension of the colon most suggestive of provided clinical suspicion of ileus. Electronically Signed   By: Sandi Mariscal M.D.   On: 10/19/2021 08:39   DG Abd 1 View  Result Date: 10/18/2021 CLINICAL DATA:  Abdominal pain EXAM: ABDOMEN - 1 VIEW COMPARISON:  10/16/2021 FINDINGS: Two supine views of the abdomen and pelvis. Right hemidiaphragm elevation. Removal of nasogastric tube. Cardiomegaly. Gas-filled colon. Minimal distal gas. This may obscure dilated small bowel loops. No gross free intraperitoneal air. No abnormal abdominal calcifications. IMPRESSION: Gas-filled colon, suggesting adynamic ileus versus less likely distal obstruction. Electronically Signed   By: Abigail Miyamoto M.D.   On: 10/18/2021 18:15   DG Abd Portable 1V-Small Bowel Obstruction Protocol-initial, 8 hr delay  Result Date: 10/19/2021 CLINICAL DATA:  SBO, 8 hr delay films EXAM: PORTABLE ABDOMEN - 1 VIEW COMPARISON:  X-ray abdomen 10/18/2021. FINDINGS: PO contrast reaches the rectum. Persistent marked gaseous dilatation of the colon. No radio-opaque calculi or other significant radiographic abnormality are seen. IMPRESSION: PO contrast reaches the rectum. Persistent marked gaseous dilatation of the colon. Electronically Signed   By: Iven Finn M.D.   On: 10/19/2021 20:50         Scheduled Meds:  ALPRAZolam  0.5 mg Oral BID   aspirin  81 mg Oral Daily   B-complex with vitamin C  1 tablet Oral Daily   calcium-vitamin D  1 tablet Oral BID   chewing gum (ORBIT) sugar free  1 Stick Oral TID   gabapentin  800 mg Oral TID   heparin  5,000 Units Subcutaneous Q8H   lisinopril  5 mg Oral Daily   metoprolol succinate  25 mg Oral Daily   nicotine  21 mg Transdermal Daily   pantoprazole (PROTONIX) IV  40 mg Intravenous Q24H   sertraline  50 mg Oral Daily   tamsulosin  0.4 mg Oral QPM   tiotropium  18 mcg Inhalation Daily   Continuous Infusions:  ampicillin (OMNIPEN) IV Stopped (10/20/21 0551)   dextrose 5 % and 0.9% NaCl 75 mL/hr at 10/20/21 0552   levETIRAcetam Stopped (10/20/21 0036)   methocarbamol (ROBAXIN) IV 500 mg (10/20/21 0556)   potassium chloride 10 mEq (10/20/21 1131)     LOS: 4 days   Time spent= 35 mins    Kairon Shock Arsenio Loader, MD [facility]  If 7PM-7AM, please contact night-coverage  10/20/2021, 12:34 PM

## 2021-10-20 NOTE — Progress Notes (Signed)
GI Inpatient Follow-up Note  Subjective:  Patient seen and feels like he needs to have bowel movement.  Scheduled Inpatient Medications:   ALPRAZolam  0.5 mg Oral BID   aspirin  81 mg Oral Daily   B-complex with vitamin C  1 tablet Oral Daily   calcium-vitamin D  1 tablet Oral BID   chewing gum (ORBIT) sugar free  1 Stick Oral TID   gabapentin  800 mg Oral TID   heparin  5,000 Units Subcutaneous Q8H   lisinopril  5 mg Oral Daily   metoprolol succinate  25 mg Oral Daily   nicotine  21 mg Transdermal Daily   pantoprazole (PROTONIX) IV  40 mg Intravenous Q24H   sertraline  50 mg Oral Daily   tamsulosin  0.4 mg Oral QPM   tiotropium  18 mcg Inhalation Daily    Continuous Inpatient Infusions:    ampicillin (OMNIPEN) IV     dextrose 5 % and 0.9% NaCl 75 mL/hr at 10/20/21 0552   levETIRAcetam 1,000 mg (10/20/21 1516)   methocarbamol (ROBAXIN) IV 500 mg (10/20/21 0556)    PRN Inpatient Medications:  acetaminophen, albuterol, alum & mag hydroxide-simeth, dextromethorphan-guaiFENesin, diclofenac Sodium, hydrALAZINE, ketorolac, LORazepam, LORazepam, menthol-cetylpyridinium, metoprolol tartrate, nitroGLYCERIN, nystatin, ondansetron (ZOFRAN) IV, traZODone  Review of Systems:  Review of Systems  Constitutional:  Negative for chills and fever.  Respiratory:  Negative for shortness of breath.   Gastrointestinal:  Negative for abdominal pain, blood in stool, constipation, diarrhea and melena.  Skin:  Negative for rash.  Neurological:  Negative for focal weakness.  Psychiatric/Behavioral:  Negative for substance abuse.   All other systems reviewed and are negative.    Physical Examination: BP (!) 152/88 (BP Location: Left Arm)    Pulse 84    Temp 98.2 F (36.8 C) (Oral)    Resp 16    Wt 94 kg    SpO2 98%    BMI 28.50 kg/m  Gen: NAD, sitting on bed pan HEENT: PEERLA Neck: supple Chest: No respiratory distress Abd: soft, maybe slightly distended but it's soft Ext: no edema Skin: no  rash or lesions noted Lymph: no lymphadenopathy  Data: Lab Results  Component Value Date   WBC 4.4 10/20/2021   HGB 12.5 (L) 10/20/2021   HCT 38.8 (L) 10/20/2021   MCV 93.7 10/20/2021   PLT 180 10/20/2021   Recent Labs  Lab 10/16/21 0700 10/17/21 0429 10/20/21 0553  HGB 14.3 12.9* 12.5*   Lab Results  Component Value Date   NA 142 10/20/2021   K 3.6 10/20/2021   CL 112 (H) 10/20/2021   CO2 21 (L) 10/20/2021   BUN 15 10/20/2021   CREATININE 1.01 10/20/2021   Lab Results  Component Value Date   ALT 20 10/16/2021   AST 20 10/16/2021   ALKPHOS 67 10/16/2021   BILITOT 0.6 10/16/2021   Recent Labs  Lab 10/16/21 0700  APTT 33  INR 1.0   Assessment/Plan: 71 y/o gentleman with multiple medical problems including COPD, chronic abdominal pain, CVA's, and history of colon cancer s/p sigmoidectomy here with ileus due to chronic narcotics. Small bowel f/u shows contrast reaching the rectum  Recommendations:  - avoid ALL opioid pain medicines - replete electrolytes - will trial clear liquids - if ileus is persistent then will need to game plan for potential initiation of TPN which we consider about 7 days out from time of NPO but will await clear liquid trial.     Will follow along, please call  with any questions or concerns.   Raylene Miyamoto MD, MPH [place]

## 2021-10-21 ENCOUNTER — Inpatient Hospital Stay: Payer: Medicare Other

## 2021-10-21 DIAGNOSIS — J449 Chronic obstructive pulmonary disease, unspecified: Secondary | ICD-10-CM | POA: Diagnosis not present

## 2021-10-21 DIAGNOSIS — R109 Unspecified abdominal pain: Secondary | ICD-10-CM | POA: Diagnosis not present

## 2021-10-21 DIAGNOSIS — F418 Other specified anxiety disorders: Secondary | ICD-10-CM | POA: Diagnosis not present

## 2021-10-21 DIAGNOSIS — I2510 Atherosclerotic heart disease of native coronary artery without angina pectoris: Secondary | ICD-10-CM | POA: Diagnosis not present

## 2021-10-21 LAB — CULTURE, BLOOD (SINGLE)
Culture: NO GROWTH
Special Requests: ADEQUATE

## 2021-10-21 LAB — BASIC METABOLIC PANEL
Anion gap: 6 (ref 5–15)
BUN: 12 mg/dL (ref 8–23)
CO2: 21 mmol/L — ABNORMAL LOW (ref 22–32)
Calcium: 8.4 mg/dL — ABNORMAL LOW (ref 8.9–10.3)
Chloride: 115 mmol/L — ABNORMAL HIGH (ref 98–111)
Creatinine, Ser: 0.99 mg/dL (ref 0.61–1.24)
GFR, Estimated: >60 mL/min (ref >60)
Glucose, Bld: 99 mg/dL (ref 70–99)
Potassium: 3.6 mmol/L (ref 3.5–5.1)
Sodium: 142 mmol/L (ref 135–145)

## 2021-10-21 LAB — CBC
HCT: 36.4 % — ABNORMAL LOW (ref 39.0–52.0)
Hemoglobin: 12 g/dL — ABNORMAL LOW (ref 13.0–17.0)
MCH: 30.7 pg (ref 26.0–34.0)
MCHC: 33 g/dL (ref 30.0–36.0)
MCV: 93.1 fL (ref 80.0–100.0)
Platelets: 155 10*3/uL (ref 150–400)
RBC: 3.91 MIL/uL — ABNORMAL LOW (ref 4.22–5.81)
RDW: 12.4 % (ref 11.5–15.5)
WBC: 3.9 10*3/uL — ABNORMAL LOW (ref 4.0–10.5)
nRBC: 0 % (ref 0.0–0.2)

## 2021-10-21 LAB — GLUCOSE, CAPILLARY
Glucose-Capillary: 115 mg/dL — ABNORMAL HIGH (ref 70–99)
Glucose-Capillary: 86 mg/dL (ref 70–99)
Glucose-Capillary: 88 mg/dL (ref 70–99)
Glucose-Capillary: 93 mg/dL (ref 70–99)
Glucose-Capillary: 93 mg/dL (ref 70–99)

## 2021-10-21 LAB — MAGNESIUM: Magnesium: 1.9 mg/dL (ref 1.7–2.4)

## 2021-10-21 MED ORDER — POTASSIUM CHLORIDE 10 MEQ/100ML IV SOLN
10.0000 meq | INTRAVENOUS | Status: AC
  Administered 2021-10-21 (×3): 10 meq via INTRAVENOUS
  Filled 2021-10-21 (×3): qty 100

## 2021-10-21 MED ORDER — DEXTROSE-NACL 5-0.9 % IV SOLN: INTRAVENOUS | Status: DC

## 2021-10-21 MED ORDER — DEXTROSE-NACL 5-0.45 % IV SOLN: INTRAVENOUS | Status: AC

## 2021-10-21 MED ORDER — ADULT MULTIVITAMIN W/MINERALS CH
1.0000 | ORAL_TABLET | Freq: Every day | ORAL | Status: DC
  Administered 2021-10-21 – 2021-11-02 (×12): 1 via ORAL
  Filled 2021-10-21 (×12): qty 1

## 2021-10-21 MED ORDER — METHOCARBAMOL 500 MG PO TABS
500.0000 mg | ORAL_TABLET | Freq: Three times a day (TID) | ORAL | Status: DC
  Administered 2021-10-21 – 2021-10-24 (×10): 500 mg via ORAL
  Filled 2021-10-21 (×13): qty 1

## 2021-10-21 MED ORDER — PROSOURCE PLUS PO LIQD
30.0000 mL | Freq: Two times a day (BID) | ORAL | Status: DC
  Administered 2021-10-21 – 2021-10-24 (×5): 30 mL via ORAL
  Filled 2021-10-21 (×9): qty 30

## 2021-10-21 MED ORDER — BOOST / RESOURCE BREEZE PO LIQD CUSTOM
1.0000 | Freq: Three times a day (TID) | ORAL | Status: DC
Start: 2021-10-21 — End: 2021-10-24
  Administered 2021-10-22 – 2021-10-24 (×8): 1 via ORAL

## 2021-10-21 NOTE — Plan of Care (Signed)
  Problem: Activity: Goal: Risk for activity intolerance will decrease Outcome: Progressing   Problem: Nutrition: Goal: Adequate nutrition will be maintained Outcome: Progressing   Problem: Coping: Goal: Level of anxiety will decrease Outcome: Progressing   Problem: Safety: Goal: Ability to remain free from injury will improve Outcome: Progressing   

## 2021-10-21 NOTE — Progress Notes (Signed)
small bowel follow through contrast noted in colon with some BM today. Pt with no sign of obstruction and likely has pseudoobstruction, chronic motility issues rather than anything surgical. Surgery to sign off. Call with questions

## 2021-10-21 NOTE — Progress Notes (Signed)
GI Inpatient Follow-up Note  Subjective:  Patient seen and had liquid bowel movement earlier today but nothing since and limited flatus.  Scheduled Inpatient Medications:   ALPRAZolam  0.5 mg Oral BID   aspirin  81 mg Oral Daily   B-complex with vitamin C  1 tablet Oral Daily   calcium-vitamin D  1 tablet Oral BID   chewing gum (ORBIT) sugar free  1 Stick Oral TID   gabapentin  800 mg Oral TID   heparin  5,000 Units Subcutaneous Q8H   lisinopril  5 mg Oral Daily   metoprolol succinate  25 mg Oral Daily   nicotine  21 mg Transdermal Daily   pantoprazole (PROTONIX) IV  40 mg Intravenous Q24H   sertraline  50 mg Oral Daily   tamsulosin  0.4 mg Oral QPM   tiotropium  18 mcg Inhalation Daily    Continuous Inpatient Infusions:    ampicillin (OMNIPEN) IV 1 g (10/21/21 1404)   dextrose 5 % and 0.45% NaCl 75 mL/hr at 10/21/21 1340   levETIRAcetam 1,000 mg (10/21/21 1346)   methocarbamol (ROBAXIN) IV 500 mg (10/21/21 0655)    PRN Inpatient Medications:  acetaminophen, albuterol, alum & mag hydroxide-simeth, dextromethorphan-guaiFENesin, diclofenac Sodium, hydrALAZINE, ketorolac, LORazepam, LORazepam, menthol-cetylpyridinium, metoprolol tartrate, nitroGLYCERIN, nystatin, ondansetron (ZOFRAN) IV, traZODone  Review of Systems:  Review of Systems  Constitutional:  Negative for chills and fever.  Eyes:  Negative for photophobia.  Respiratory:  Negative for shortness of breath.   Gastrointestinal:  Negative for abdominal pain, blood in stool, constipation, diarrhea and vomiting.  Musculoskeletal:  Negative for joint pain.  Skin:  Negative for itching and rash.  Neurological:  Negative for focal weakness.  Psychiatric/Behavioral:  Negative for substance abuse.   All other systems reviewed and are negative.     Physical Examination: BP 133/78 (BP Location: Right Arm)    Pulse (!) 59    Temp 98 F (36.7 C) (Oral)    Resp 16    Wt 94 kg    SpO2 95%    BMI 28.50 kg/m  Gen: NAD, alert  and oriented x 4 HEENT: PEERLA Neck: supple Chest: No respiratory distress CV: RRR, no m/g/c/r Abd: soft, slightly distended Ext: no edema, well perfused with 2+ pulses, Skin: no rash or lesions noted Lymph: no LAD  Data: Lab Results  Component Value Date   WBC 3.9 (L) 10/21/2021   HGB 12.0 (L) 10/21/2021   HCT 36.4 (L) 10/21/2021   MCV 93.1 10/21/2021   PLT 155 10/21/2021   Recent Labs  Lab 10/17/21 0429 10/20/21 0553 10/21/21 0600  HGB 12.9* 12.5* 12.0*   Lab Results  Component Value Date   NA 142 10/21/2021   K 3.6 10/21/2021   CL 115 (H) 10/21/2021   CO2 21 (L) 10/21/2021   BUN 12 10/21/2021   CREATININE 0.99 10/21/2021   Lab Results  Component Value Date   ALT 20 10/16/2021   AST 20 10/16/2021   ALKPHOS 67 10/16/2021   BILITOT 0.6 10/16/2021   Recent Labs  Lab 10/16/21 0700  APTT 33  INR 1.0   Assessment/Plan: Calvin [name] is a 71 y.o. gentleman with multiple medical problems including COPD, chronic abdominal pain, CVA's, and history of colon cancer s/p sigmoidectomy here with ileus due to chronic narcotics  Recommendations:  - avoid ALL opioid pain medicines - would not advance diet at this time given limited bowel movement - replete electrolytes - if ileus is persistent then will need to game  plan for potential initiation of TPN which we consider about 7 days out from time of NPO. Patient presented on 2/12 so 2/19 would be 7 days which is a weekend day   Dr. [name] will be covering over the weekend      Will follow along, please call with any questions or concerns.   Calvin Miyamoto MD, MPH [place]

## 2021-10-21 NOTE — Progress Notes (Signed)
PROGRESS NOTE    Calvin [name]  NOB:[id number] DOB: 28-Mar-1951 DOA: 10/16/2021 PCP: Pcp, No   Brief Narrative:  71 y.o. male with medical history significant of Ogilvie's syndrome, ileus, hypertension, hyperlipidemia, COPD, stroke with mild left-sided weakness, GERD, gout, depression with anxiety, seizure, CAD, MI,  alcohol abuse, CKD stage IIIa, dCHF, colon cancer, thyroid cancer, who presents with nausea vomiting, abdominal pain.    Patient is a poor historian, history is limited. Pt states that has been having abdominal pain for 2 days, associated with nausea, nonbilious nonbloody vomiting several times.  He also has abdominal distention.  Per his daughter (I called her daughter by phone), she noticed patient's abdominal distention several days ago.  Patient states that he has diarrhea, but per nurse observation, patient's stools are formed in the ED.  Patient does not have chest pain, cough, shortness of breath.  Patient has fever 100.4 in ED.     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  Follow through XR showed contrast in the rectum. Cont Clears. Out of bed to chair. Monitor lytes.  -GI and general surgery following- they are to discuss the case.  GI considering TPN if no return of bowel fx by 2/19   History of Ogilvie syndrome Laxatives on hold   Possible UTI, felt unlikely Sepsis ruled out -Ucx grew Enteroccocus, On Ampicillin.    CAD (coronary artery disease) with overnight complaints of chest pain -Continue aspirin and statin -Cardiology consulted by 19   COPD (chronic obstructive pulmonary disease) ([place]): Stable -Bronchodilators as needed   Depression with anxiety -Continue home medications when able to tolerate. Home PO meds - Zoloft and Xanax.    HLD (hyperlipidemia) -Continue statin   HTN (hypertension) -Currently home medications are on hold.  IV hydralazine and  Lopressor as needed   Seizure -Seizure precautions.  Currently on IV Keppra   History of Stroke ([place]) -ASA and zocor   Chronic kidney disease, stage 3a ([place]): stable -f/u by BMP   Tobacco abuse: -Nicotine patch      DVT prophylaxis: SQ Heparin Code Status: DNR Family Communication:  [name] updated today   Status is: Inpatient Remains inpatient appropriate because: Maintain hospital until return of bowel function. GI and gen Sx following.   Subjective: Still no BM, Tolerated some coffee and Apple juice this morning Was out of bed to chair yesterday.   Examination:  Constitutional: Not in acute distress Respiratory: Clear to auscultation bilaterally Cardiovascular: Normal sinus rhythm, no rubs Abdomen: Distended with diminished BS Musculoskeletal: No edema noted Skin: No rashes seen Neurologic: CN 2-12 grossly intact.  And nonfocal Psychiatric: Normal judgment and insight. Alert and oriented x 3. Normal mood.     Objective: Vitals:   10/20/21 1654 10/20/21 2033 10/21/21 0541 10/21/21 0755  BP: (!) 144/88 (!) 149/83 134/78 133/78  Pulse: 67 74 66 (!) 59  Resp: 16  20 16   Temp: 98.4 F (36.9 C) 98.1 F (36.7 C) 98.8 F (37.1 C) 98 F (36.7 C)  TempSrc: Oral Oral Oral Oral  SpO2: 99% 95% 98% 95%  Weight:        Intake/Output Summary (Last 24 hours) at 10/21/2021 1059 Last data filed at 10/21/2021 1013 Gross per 24 hour  Intake 1278.61 ml  Output --  Net 1278.61 ml   Filed Weights   10/16/21 1212 10/19/21 0459 10/20/21 0440  Weight: 97.5 kg 92 kg 94 kg  Data Reviewed:   CBC: Recent Labs  Lab 10/16/21 0700 10/17/21 0429 10/20/21 0553 10/21/21 0600  WBC 7.6 6.4 4.4 3.9*  NEUTROABS 5.6  --   --   --   HGB 14.3 12.9* 12.5* 12.0*  HCT 45.7 40.9 38.8* 36.4*  MCV 97.4 97.4 93.7 93.1  PLT 282 208 180 130   Basic Metabolic Panel: Recent Labs  Lab 10/16/21 0700 10/17/21 0429 10/20/21 0553 10/21/21 0600  NA 138 144 142 142  K 3.5 4.0  3.6 3.6  CL 95* 103 112* 115*  CO2 31 28 21* 21*  GLUCOSE 108* 89 98 99  BUN 17 17 15 12   CREATININE 1.32* 1.18 1.01 0.99  CALCIUM 9.9 8.8* 8.2* 8.4*  MG  --   --  2.1 1.9   GFR: Estimated Creatinine Clearance: 79.7 mL/min (by C-G formula based on SCr of 0.99 mg/dL). Liver Function Tests: Recent Labs  Lab 10/16/21 0700  AST 20  ALT 20  ALKPHOS 67  BILITOT 0.6  PROT 7.7  ALBUMIN 4.3   Recent Labs  Lab 10/16/21 0700  LIPASE 34   No results for input(s): AMMONIA in the last 168 hours. Coagulation Profile: Recent Labs  Lab 10/16/21 0700  INR 1.0   Cardiac Enzymes: No results for input(s): CKTOTAL, CKMB, CKMBINDEX, TROPONINI in the last 168 hours. BNP (last 3 results) No results for input(s): PROBNP in the last 8760 hours. HbA1C: No results for input(s): HGBA1C in the last 72 hours. CBG: Recent Labs  Lab 10/20/21 0007 10/20/21 0556 10/20/21 1305 10/20/21 1859 10/21/21 0153  GLUCAP 99 98 91 94 88   Lipid Profile: No results for input(s): CHOL, HDL, LDLCALC, TRIG, CHOLHDL, LDLDIRECT in the last 72 hours. Thyroid Function Tests: No results for input(s): TSH, T4TOTAL, FREET4, T3FREE, THYROIDAB in the last 72 hours. Anemia Panel: No results for input(s): VITAMINB12, FOLATE, FERRITIN, TIBC, IRON, RETICCTPCT in the last 72 hours. Sepsis Labs: Recent Labs  Lab 10/16/21 0700 10/16/21 0743  PROCALCITON  --  <0.10  LATICACIDVEN 1.3  --     Recent Results (from the past 240 hour(s))  Resp Panel by RT-PCR (Flu A&B, Covid) Nasopharyngeal Swab     Status: None   Collection Time: 10/16/21  6:43 AM   Specimen: Nasopharyngeal Swab; Nasopharyngeal(NP) swabs in vial transport medium  Result Value Ref Range Status   SARS Coronavirus 2 by RT PCR NEGATIVE NEGATIVE Final    Comment: (NOTE) SARS-CoV-2 target nucleic acids are NOT DETECTED.  The SARS-CoV-2 RNA is generally detectable in upper respiratory specimens during the acute phase of infection. The  lowest concentration of SARS-CoV-2 viral copies this assay can detect is 138 copies/mL. A negative result does not preclude SARS-Cov-2 infection and should not be used as the sole basis for treatment or other patient management decisions. A negative result may occur with  improper specimen collection/handling, submission of specimen other than nasopharyngeal swab, presence of viral mutation(s) within the areas targeted by this assay, and inadequate number of viral copies(<138 copies/mL). A negative result must be combined with clinical observations, patient history, and epidemiological information. The expected result is Negative.  Fact Sheet for Patients:  EntrepreneurPulse.com.au  Fact Sheet for Healthcare Providers:  IncredibleEmployment.be  This test is no t yet approved or cleared by the [place] FDA and  has been authorized for detection and/or diagnosis of SARS-CoV-2 by FDA under an Emergency Use Authorization (EUA). This EUA will remain  in effect (meaning this test can be used) for the  duration of the COVID-19 declaration under Section 564(b)(1) of the Act, 21 U.S.C.section 360bbb-3(b)(1), unless the authorization is terminated  or revoked sooner.       Influenza A by PCR NEGATIVE NEGATIVE Final   Influenza B by PCR NEGATIVE NEGATIVE Final    Comment: (NOTE) The Xpert Xpress SARS-CoV-2/FLU/RSV plus assay is intended as an aid in the diagnosis of influenza from Nasopharyngeal swab specimens and should not be used as a sole basis for treatment. Nasal washings and aspirates are unacceptable for Xpert Xpress SARS-CoV-2/FLU/RSV testing.  Fact Sheet for Patients: EntrepreneurPulse.com.au  Fact Sheet for Healthcare Providers: IncredibleEmployment.be  This test is not yet approved or cleared by the [place] FDA and has been authorized for detection and/or diagnosis of SARS-CoV-2 by FDA under  an Emergency Use Authorization (EUA). This EUA will remain in effect (meaning this test can be used) for the duration of the COVID-19 declaration under Section 564(b)(1) of the Act, 21 U.S.C. section 360bbb-3(b)(1), unless the authorization is terminated or revoked.  Performed at [facility], [address] [place], [place] [address]   Blood culture (routine single)     Status: None   Collection Time: 10/16/21  7:00 AM   Specimen: BLOOD  Result Value Ref Range Status   Specimen Description BLOOD ARM  Final   Special Requests   Final    BOTTLES DRAWN AEROBIC AND ANAEROBIC Blood Culture adequate volume   Culture   Final    NO GROWTH 5 DAYS Performed at [facility], [address] [place], [place] [address]    Report Status 10/21/2021 FINAL  Final  Urine Culture     Status: Abnormal   Collection Time: 10/16/21 10:24 AM   Specimen: In/Out Cath Urine  Result Value Ref Range Status   Specimen Description   Final    IN/OUT CATH URINE Performed at [facility], [address] [place], [place] [address]    Special Requests   Final    NONE Performed at [facility], [place], [place] [address]    Culture 60,000 COLONIES/mL ENTEROCOCCUS FAECALIS (A)  Final   Report Status 10/19/2021 FINAL  Final   Organism ID, Bacteria ENTEROCOCCUS FAECALIS (A)  Final      Susceptibility   Enterococcus faecalis - MIC*    AMPICILLIN <=2 SENSITIVE Sensitive     NITROFURANTOIN <=16 SENSITIVE Sensitive     VANCOMYCIN 1 SENSITIVE Sensitive     * 60,000 COLONIES/mL ENTEROCOCCUS FAECALIS  C Difficile Quick Screen w PCR reflex     Status: None   Collection Time: 10/16/21  1:39 PM   Specimen: STOOL  Result Value Ref Range Status   C Diff antigen NEGATIVE NEGATIVE Final   C Diff toxin NEGATIVE NEGATIVE Final   C Diff interpretation No C. difficile detected.  Final    Comment: Performed at [facility], [address] [place],  [place] [address]  Gastrointestinal Panel by PCR , Stool     Status: None   Collection Time: 10/16/21  1:39 PM   Specimen: STOOL  Result Value Ref Range Status   Campylobacter species NOT DETECTED NOT DETECTED Final   Plesimonas shigelloides NOT DETECTED NOT DETECTED Final   Salmonella species NOT DETECTED NOT DETECTED Final   Yersinia enterocolitica NOT DETECTED NOT DETECTED Final   Vibrio species NOT DETECTED NOT DETECTED Final   Vibrio cholerae NOT DETECTED NOT DETECTED Final   Enteroaggregative E coli (EAEC) NOT DETECTED NOT DETECTED Final  Enteropathogenic E coli (EPEC) NOT DETECTED NOT DETECTED Final   Enterotoxigenic E coli (ETEC) NOT DETECTED NOT DETECTED Final   Shiga like toxin producing E coli (STEC) NOT DETECTED NOT DETECTED Final   Shigella/Enteroinvasive E coli (EIEC) NOT DETECTED NOT DETECTED Final   Cryptosporidium NOT DETECTED NOT DETECTED Final   Cyclospora cayetanensis NOT DETECTED NOT DETECTED Final   Entamoeba histolytica NOT DETECTED NOT DETECTED Final   Giardia lamblia NOT DETECTED NOT DETECTED Final   Adenovirus F40/41 NOT DETECTED NOT DETECTED Final   Astrovirus NOT DETECTED NOT DETECTED Final   Norovirus GI/GII NOT DETECTED NOT DETECTED Final   Rotavirus A NOT DETECTED NOT DETECTED Final   Sapovirus (I, II, IV, and V) NOT DETECTED NOT DETECTED Final    Comment: Performed at [facility], [address] [place], [place] [address]  MRSA Next Gen by PCR, Nasal     Status: None   Collection Time: 10/17/21  4:15 AM   Specimen: Nasal Mucosa; Nasal Swab  Result Value Ref Range Status   MRSA by PCR Next Gen NOT DETECTED NOT DETECTED Final    Comment: (NOTE) The GeneXpert MRSA Assay (FDA approved for NASAL specimens only), is one component of a comprehensive MRSA colonization surveillance program. It is not intended to diagnose MRSA infection nor to guide or monitor treatment for MRSA infections. Test performance is not FDA approved in patients less than 90  years old. Performed at [facility], [address] [place], [place] [address]          Radiology Studies: DG Abd Portable 1V-Small Bowel Obstruction Protocol-initial, 8 hr delay  Result Date: 10/19/2021 CLINICAL DATA:  SBO, 8 hr delay films EXAM: PORTABLE ABDOMEN - 1 VIEW COMPARISON:  X-ray abdomen 10/18/2021. FINDINGS: PO contrast reaches the rectum. Persistent marked gaseous dilatation of the colon. No radio-opaque calculi or other significant radiographic abnormality are seen. IMPRESSION: PO contrast reaches the rectum. Persistent marked gaseous dilatation of the colon. Electronically Signed   By: Iven Finn M.D.   On: 10/19/2021 20:50        Scheduled Meds:  ALPRAZolam  0.5 mg Oral BID   aspirin  81 mg Oral Daily   B-complex with vitamin C  1 tablet Oral Daily   calcium-vitamin D  1 tablet Oral BID   chewing gum (ORBIT) sugar free  1 Stick Oral TID   gabapentin  800 mg Oral TID   heparin  5,000 Units Subcutaneous Q8H   lisinopril  5 mg Oral Daily   metoprolol succinate  25 mg Oral Daily   nicotine  21 mg Transdermal Daily   pantoprazole (PROTONIX) IV  40 mg Intravenous Q24H   sertraline  50 mg Oral Daily   tamsulosin  0.4 mg Oral QPM   tiotropium  18 mcg Inhalation Daily   Continuous Infusions:  ampicillin (OMNIPEN) IV 1 g (10/21/21 0836)   levETIRAcetam 1,000 mg (10/21/21 0126)   methocarbamol (ROBAXIN) IV 500 mg (10/21/21 0655)   potassium chloride 10 mEq (10/21/21 1022)     LOS: 5 days   Time spent= 35 mins    Keyleen Cerrato Arsenio Loader, MD [facility]  If 7PM-7AM, please contact night-coverage  10/21/2021, 10:59 AM

## 2021-10-21 NOTE — Progress Notes (Signed)
Initial Nutrition Assessment  DOCUMENTATION CODES:   Not applicable  INTERVENTION:  - will order Boost Breeze TID, each supplement provides 250 kcal and 9 grams of protein. - will order 30 ml Prosource Plus BID, each supplement provides 100 kcal and 15 grams protein.  - will order Multivitamin with minerals daily - diet advancement as medically feasible.    NUTRITION DIAGNOSIS:   Inadequate protein intake related to other (see comment) (current diet order) as evidenced by other (comment) (CLD does not meet needs).  GOAL:   Patient will meet greater than or equal to 90% of their needs  MONITOR:   PO intake, Supplement acceptance, Diet advancement, Labs, Weight trends  REASON FOR ASSESSMENT:   NPO/Clear Liquid Diet  ASSESSMENT:   71 y.o. male with medical history of Ogilvie's syndrome, ileus, HTN, HLD, COPD, stroke with mild L-sided weakness, GERD, gout, depression, anxiety, seizure, CAD, MI, alcohol abuse, stage 3 CKD, CHF, colon cancer, and thyroid cancer. He presented to the ED with abdominal pain x2 days, N/V, and abdominal distention. He was admitted with partial SBO/ileus.  Patient laying bed with no visitors present at the time of RD visit. Patient with some confusion noted throughout RD visit. He is on CLD but reports he was told he may soon be able to have solid food. He was looking at the menu deciding what he would like to have once that occurs.   He feels that external urinary cath may have come off. Tech able to assist patient with replacing this.   Weight yesterday was 207 lb and weight on 10/02/21 was 212 lb. This indicates 5 lb (2.3% body weight) in the past 2.5 weeks.   GI following and note from today indicates possible need for TPN in the next few days.   Labs reviewed; CBGs: 88, 93, 86 mg/dl, Cl: 115 mmol/l, Ca: 8.4 mg/dl.  Medications reviewed; 1 tablet vitamin B-complex with vitamin C, 1 tablet oscal-D BID, 40 mg IV protonix/day, 10 mEq IV KCl x1 run  2/16 and x3 runs 2/17.   IVF; D5-1/2 NS @ 75 ml/hr (306 kcal/24 hrs).   NUTRITION - FOCUSED PHYSICAL EXAM:  Flowsheet Row Most Recent Value  Orbital Region No depletion  Upper Arm Region No depletion  Thoracic and Lumbar Region Unable to assess  Buccal Region No depletion  Temple Region No depletion  Clavicle Bone Region No depletion  Clavicle and Acromion Bone Region No depletion  Scapular Bone Region No depletion  Dorsal Hand No depletion  Patellar Region No depletion  Anterior Thigh Region No depletion  Posterior Calf Region No depletion  Edema (RD Assessment) Mild  [BLE]  Hair Reviewed  Eyes Reviewed  Mouth Reviewed  Skin Reviewed  Nails Reviewed       Diet Order:   Diet Order             Diet clear liquid Room service appropriate? Yes; Fluid consistency: Thin  Diet effective now                   EDUCATION NEEDS:   No education needs have been identified at this time  Skin:  Skin Assessment: Reviewed RN Assessment  Last BM:  2/17 (type 7 x1)  Height:   Ht Readings from Last 1 Encounters:  10/02/21 5' 11.5" (1.816 m)    Weight:   Wt Readings from Last 1 Encounters:  10/20/21 94 kg     BMI:  Body mass index is 28.5 kg/m.   Estimated  Nutritional Needs:  Kcal:  1800-2000 kcal Protein:  90-105 grams Fluid:  >/= 2 L/day     Jarome Matin, MS, RD, LDN Inpatient Clinical Dietitian RD pager # available in [place]  After hours/weekend pager # available in [facility]

## 2021-10-21 NOTE — Progress Notes (Signed)
Mobility Specialist - Progress Note   10/21/21 1100  Mobility  Activity Ambulated with assistance to bathroom;Ambulated with assistance in room;Transferred from bed to chair  Level of Assistance Standby assist, set-up cues, supervision of patient - no hands on  Assistive Device Front wheel walker;BSC  Distance Ambulated (ft) 20 ft  Activity Response Tolerated well  $Mobility charge 1 Mobility    Pt lying in bed upon arrival, utilizing RA. ModA + extra time to achieve EOB with assist to hip shift forward. Pt ambulated to bathroom for BM. Displayed fine motor difficulty when attempting to grasp toilet paper with L hand--achieved with extensive time. Completed hand grips, noted R stronger than L. Pt ambulated to recliner with needs in reach.    Kathee Delton Mobility Specialist 10/21/21, 11:45 AM

## 2021-10-21 NOTE — NC FL2 (Signed)
[place] LEVEL OF CARE SCREENING TOOL     IDENTIFICATION  [name] Birthdate: 1951/03/27 Sex: male Admission Date (Current Location): 10/16/2021  [name] and [place] Number:  Engineering geologist and Address:  [facility], [address] [place], [place] [address]      Provider Number: [id number]  Attending Physician Name and Address:  Damita Lack, MD  Relative Name and Phone Number:       Current Level of Care: Hospital Recommended Level of Care: [place] Prior Approval Number:    Date Approved/Denied:   PASRR Number: [id number]  Discharge Plan: SNF    Current Diagnoses: Patient Active Problem List   Diagnosis Date Noted   HLD (hyperlipidemia) 10/16/2021   Stroke ([place]) 10/16/2021   Nausea vomiting and diarrhea 10/16/2021   Sepsis ([place]) 10/16/2021   Chronic kidney disease, stage 3a ([place]) 10/16/2021   Tobacco abuse 10/16/2021   Muscle twitching 08/14/2019   UTI (urinary tract infection) 08/03/2019   Ileus ([place]) 08/03/2019   Hypokalemia 08/03/2019   QT prolongation 08/03/2019   Ogilvie's syndrome    Abdominal pain 04/03/2019   Left-sided weakness 09/30/2017   Seizures ([place]) 09/30/2017   HTN (hypertension) 09/30/2017   CAD (coronary artery disease) 09/30/2017   COPD (chronic obstructive pulmonary disease) ([place]) 09/30/2017   Depression with anxiety 09/30/2017    Orientation RESPIRATION BLADDER Height & Weight     Self, Place  Normal Incontinent, External catheter Weight: 207 lb 3.7 oz (94 kg) Height:     BEHAVIORAL SYMPTOMS/MOOD NEUROLOGICAL BOWEL NUTRITION STATUS   (None)  (History of seizures, stroke) Incontinent Diet (Follow for discharge recommendations. Currently on clear liqiuids.)  AMBULATORY STATUS COMMUNICATION OF NEEDS Skin   Limited Assist Verbally Normal                       Personal Care Assistance Level of Assistance              Functional  Limitations Info  Sight, Hearing, Speech Sight Info: Adequate Hearing Info: Adequate Speech Info: Adequate    SPECIAL CARE FACTORS FREQUENCY                       Contractures Contractures Info: Not present    Additional Factors Info  Code Status, Allergies, Psychotropic Code Status Info: DNR Allergies Info: NKDA Psychotropic Info: Anxiety, depression        Current Medications (10/21/2021):  This is the current hospital active medication list Current Facility-Administered Medications  Medication Dose Route Frequency Provider Last Rate Last Admin   acetaminophen (TYLENOL) tablet 650 mg  650 mg Oral Q6H PRN Ivor Costa, MD   650 mg at 10/18/21 2246   albuterol (PROVENTIL) (2.5 MG/3ML) 0.083% nebulizer solution 3 mL  3 mL Inhalation Q4H PRN Ivor Costa, MD       ALPRAZolam Duanne Moron) tablet 0.5 mg  0.5 mg Oral BID Ivor Costa, MD   0.5 mg at 10/21/21 1023   alum & mag hydroxide-simeth (MAALOX/MYLANTA) 200-200-20 MG/5ML suspension 30 mL  30 mL Oral Q12H PRN Ivor Costa, MD   30 mL at 10/18/21 2246   ampicillin (OMNIPEN) 1 g in sodium chloride 0.9 % 100 mL IVPB  1 g Intravenous Q6H Darrick Penna, RPH 300 mL/hr at 10/21/21 0836 1 g at 10/21/21 0836   aspirin chewable tablet 81 mg  81 mg Oral Daily Ivor Costa, MD   81 mg  at 10/21/21 1023   B-complex with vitamin C tablet 1 tablet  1 tablet Oral Daily Ivor Costa, MD   1 tablet at 10/21/21 1024   calcium-vitamin D (OSCAL WITH D) 500-5 MG-MCG per tablet 1 tablet  1 tablet Oral BID Ivor Costa, MD   1 tablet at 10/21/21 1023   chewing gum (ORBIT) sugar free  1 Stick Oral TID Lesly Rubenstein, MD   1 Stick at 10/21/21 1025   dextromethorphan-guaiFENesin ([place] DM) 30-600 MG per 12 hr tablet 1 tablet  1 tablet Oral BID PRN Ivor Costa, MD       dextrose 5 %-0.45 % sodium chloride infusion   Intravenous Continuous Amin, Ankit Chirag, MD       diclofenac Sodium (VOLTAREN) 1 % topical gel 2 g  2 g Topical TID PRN Weller, Katy L, NP   2 g at  10/18/21 2245   gabapentin (NEURONTIN) capsule 800 mg  800 mg Oral TID Ivor Costa, MD   800 mg at 10/21/21 1023   heparin injection 5,000 Units  5,000 Units Subcutaneous Q8H Ivor Costa, MD   5,000 Units at 10/21/21 0536   hydrALAZINE (APRESOLINE) injection 10 mg  10 mg Intravenous Q4H PRN Ralene Muskrat B, MD       ketorolac (TORADOL) 15 MG/ML injection 15 mg  15 mg Intravenous Q6H PRN Ralene Muskrat B, MD   15 mg at 10/21/21 0609   levETIRAcetam (KEPPRA) IVPB 1000 mg/100 mL premix  1,000 mg Intravenous Q12H Ivor Costa, MD 400 mL/hr at 10/21/21 0126 1,000 mg at 10/21/21 0126   lisinopril (ZESTRIL) tablet 5 mg  5 mg Oral Daily Ivor Costa, MD   5 mg at 10/21/21 1023   LORazepam (ATIVAN) injection 0.5 mg  0.5 mg Intravenous Q6H PRN Ivor Costa, MD   0.5 mg at 10/19/21 0456   LORazepam (ATIVAN) injection 1 mg  1 mg Intravenous Q2H PRN Ivor Costa, MD   1 mg at 10/17/21 2029   menthol-cetylpyridinium (CEPACOL) lozenge 3 mg  1 lozenge Oral Q3H PRN Ivor Costa, MD       methocarbamol (ROBAXIN) 500 mg in dextrose 5 % 50 mL IVPB  500 mg Intravenous Q8H Sreenath, Sudheer B, MD 100 mL/hr at 10/21/21 0655 500 mg at 10/21/21 0655   metoprolol succinate (TOPROL-XL) 24 hr tablet 25 mg  25 mg Oral Daily Ivor Costa, MD   25 mg at 10/21/21 1023   metoprolol tartrate (LOPRESSOR) injection 5 mg  5 mg Intravenous Q4H PRN Amin, Ankit Chirag, MD       nicotine (NICODERM CQ - dosed in mg/24 hours) patch 21 mg  21 mg Transdermal Daily Ivor Costa, MD   21 mg at 10/21/21 1028   nitroGLYCERIN (NITROSTAT) SL tablet 0.4 mg  0.4 mg Sublingual Q5 min PRN Ivor Costa, MD   0.4 mg at 10/18/21 2015   nystatin (MYCOSTATIN/NYSTOP) topical powder 1 application  1 application Topical BID PRN Ivor Costa, MD       ondansetron [facility]) injection 4 mg  4 mg Intravenous Q8H PRN Ivor Costa, MD   4 mg at 10/17/21 0543   pantoprazole (PROTONIX) injection 40 mg  40 mg Intravenous Q24H Sreenath, Sudheer B, MD   40 mg at 10/21/21 1023    potassium chloride 10 mEq in 100 mL IVPB  10 mEq Intravenous Q1 Hr x 3 Amin, Ankit Chirag, MD 100 mL/hr at 10/21/21 1126 10 mEq at 10/21/21 1126   sertraline (ZOLOFT) tablet 50 mg  50  mg Oral Daily Ivor Costa, MD   50 mg at 10/21/21 1023   tamsulosin (FLOMAX) capsule 0.4 mg  0.4 mg Oral QPM Ivor Costa, MD   0.4 mg at 10/20/21 1830   tiotropium (SPIRIVA) inhalation capsule ([facility] use ONLY) 18 mcg  18 mcg Inhalation Daily Ivor Costa, MD   18 mcg at 10/21/21 2353   traZODone (DESYREL) tablet 50 mg  50 mg Oral QHS PRN Damita Lack, MD         Discharge Medications: Please see discharge summary for a list of discharge medications.  Relevant Imaging Results:  Relevant Lab Results:   Additional Information SS#: [id number]  Candie Chroman, LCSW

## 2021-10-22 ENCOUNTER — Inpatient Hospital Stay: Payer: Medicare Other

## 2021-10-22 DIAGNOSIS — I2510 Atherosclerotic heart disease of native coronary artery without angina pectoris: Secondary | ICD-10-CM | POA: Diagnosis not present

## 2021-10-22 DIAGNOSIS — R140 Abdominal distension (gaseous): Secondary | ICD-10-CM | POA: Diagnosis not present

## 2021-10-22 DIAGNOSIS — J449 Chronic obstructive pulmonary disease, unspecified: Secondary | ICD-10-CM | POA: Diagnosis not present

## 2021-10-22 DIAGNOSIS — K5981 Ogilvie syndrome: Secondary | ICD-10-CM | POA: Diagnosis not present

## 2021-10-22 DIAGNOSIS — F418 Other specified anxiety disorders: Secondary | ICD-10-CM | POA: Diagnosis not present

## 2021-10-22 DIAGNOSIS — R109 Unspecified abdominal pain: Secondary | ICD-10-CM

## 2021-10-22 LAB — GLUCOSE, CAPILLARY
Glucose-Capillary: 117 mg/dL — ABNORMAL HIGH (ref 70–99)
Glucose-Capillary: 140 mg/dL — ABNORMAL HIGH (ref 70–99)
Glucose-Capillary: 79 mg/dL (ref 70–99)
Glucose-Capillary: 94 mg/dL (ref 70–99)

## 2021-10-22 LAB — BASIC METABOLIC PANEL
Anion gap: 7 (ref 5–15)
BUN: 9 mg/dL (ref 8–23)
CO2: 23 mmol/L (ref 22–32)
Calcium: 8.6 mg/dL — ABNORMAL LOW (ref 8.9–10.3)
Chloride: 112 mmol/L — ABNORMAL HIGH (ref 98–111)
Creatinine, Ser: 1.1 mg/dL (ref 0.61–1.24)
GFR, Estimated: >60 mL/min (ref >60)
Glucose, Bld: 99 mg/dL (ref 70–99)
Potassium: 4.4 mmol/L (ref 3.5–5.1)
Sodium: 142 mmol/L (ref 135–145)

## 2021-10-22 LAB — CBC
HCT: 34.8 % — ABNORMAL LOW (ref 39.0–52.0)
Hemoglobin: 11.3 g/dL — ABNORMAL LOW (ref 13.0–17.0)
MCH: 30.3 pg (ref 26.0–34.0)
MCHC: 32.5 g/dL (ref 30.0–36.0)
MCV: 93.3 fL (ref 80.0–100.0)
Platelets: 165 10*3/uL (ref 150–400)
RBC: 3.73 MIL/uL — ABNORMAL LOW (ref 4.22–5.81)
RDW: 12.5 % (ref 11.5–15.5)
WBC: 3.4 10*3/uL — ABNORMAL LOW (ref 4.0–10.5)
nRBC: 0 % (ref 0.0–0.2)

## 2021-10-22 LAB — MAGNESIUM: Magnesium: 1.9 mg/dL (ref 1.7–2.4)

## 2021-10-22 NOTE — Progress Notes (Signed)
Mobility Specialist - Progress Note   10/22/21 1000  Mobility  Activity Transferred from bed to chair;Stood at bedside;Dangled on edge of bed  Range of Motion/Exercises Left leg;Right leg;Left arm  Level of Assistance Minimal assist, patient does 75% or more  Assistive Device Standard walker  Distance Ambulated (ft) 2 ft  Activity Response Tolerated well  $Mobility charge 1 Mobility     Pt supine upon arrival with RA. Pt voiced weakness in LUE and hesitant to exit on L side of bed. Completed hand grips. Pt sat EOB and STS with MinA + extra time. Completed Therex with no complaints. Transferred B-C with supervision -- LUE weakness noted. Pt left with alarm set and needs in reach.  Merrily Brittle Mobility Specialist 10/22/21, 10:48 AM

## 2021-10-22 NOTE — Evaluation (Signed)
Occupational Therapy Evaluation Patient Details Name: Calvin [name] MRN: [id number] DOB: October 10, 1950 Today's Date: 10/22/2021   History of Present Illness Calvin [name] is a 71 y.o. male with medical history significant of Ogilvie's syndrome, ileus, hypertension, hyperlipidemia, COPD, stroke with mild left-sided weakness, GERD, gout, depression with anxiety, seizure, CAD, MI,  alcohol abuse, CKD stage IIIa, dCHF, colon cancer, thyroid cancer, who presents with nausea vomiting, abdominal pain.  Patient is a poor historian, history is limited.   Clinical Impression   Pt seen for OT evaluation this date.  Pt seated on toilet upon OT arrival with RN present.  Difficult to determine PLOF for self care, but pt did state that he manages his UB bathing, but requires assist for LB, specifically d/t hx of L sided weakness from old CVA.  Pt states that he usually is able to perform his own peri care, but required total assist to complete standing today after BM.  Sit to stand from 3in1 over toilet required max A; min guard for stability and functional mobility once standing using RW.  Pt required sequencing cues for bed mobility which he did not follow.  Attempted a second time with pt repositioned on edge of bed after first attempt pt transferred to supine diagonally with legs hanging off side of bed.  Pt required heavy assist to lift legs up to bed, and 2 person assist for scooting toward HOB.  Pt will benefit from acute skilled OT for ADL training, functional transfer training, and to reinforce fall prevention strategies.  Recommend SNF at d/c to reduce physical burden on caregivers and maximize functional indep.  Left in bed ready to be picked up for transport to CT scan.      Recommendations for follow up therapy are one component of a multi-disciplinary discharge planning process, led by the attending physician.  Recommendations may be updated based on patient status, additional functional criteria and  insurance authorization.   Follow Up Recommendations  Skilled nursing-short term rehab (<3 hours/day)    Assistance Recommended at Discharge Frequent or constant Supervision/Assistance  Patient can return home with the following A lot of help with walking and/or transfers;A lot of help with bathing/dressing/bathroom;Direct supervision/assist for medications management    Functional Status Assessment  Patient has had a recent decline in their functional status and demonstrates the ability to make significant improvements in function in a reasonable and predictable amount of time.  Equipment Recommendations   (defer to next venue of care)           Precautions / Restrictions Precautions Precautions: Fall Restrictions Weight Bearing Restrictions: No      Mobility Bed Mobility Overal bed mobility: Needs Assistance Bed Mobility: Sit to Supine, Supine to Sit     Supine to sit: Mod assist Sit to supine: Mod assist   General bed mobility comments: heavy assist for transferring legs up from EOB Patient Response: Flat affect  Transfers Overall transfer level: Needs assistance Equipment used: Rolling walker (2 wheels) Transfers: Sit to/from Stand Sit to Stand: Max assist           General transfer comment: max A 3in1 placed over commode, but once up, pt ambulatory with min guard and RW      Balance Overall balance assessment: Needs assistance Sitting-balance support: Feet supported, Single extremity supported Sitting balance-Leahy Scale: Fair Sitting balance - Comments: limited reach below knees   Standing balance support: Bilateral upper extremity supported, Reliant on assistive device for balance Standing balance-Leahy Scale: Poor  ADL either performed or assessed with clinical judgement   ADL Overall ADL's : Needs assistance/impaired                     Lower Body Dressing: Maximal assistance Lower Body Dressing  Details (indicate cue type and reason): to don socks in sitting Toilet Transfer: Maximal assistance;BSC/3in1;Rolling walker (2 wheels) Toilet Transfer Details (indicate cue type and reason): 3in1 placed over toilet, max sit to stand Toileting- Clothing Manipulation and Hygiene: Total assistance;Sit to/from stand Toileting - Clothing Manipulation Details (indicate cue type and reason): Pt required 2 hands on walker in standing, requiring OT to assist with perihygiene     Functional mobility during ADLs: Min guard General ADL Comments: min guard with RW to amb bathroom to bed.     Vision Patient Visual Report: No change from baseline                  Pertinent Vitals/Pain Pain Assessment Pain Assessment: No/denies pain (pt reported belly feeling softer after voiding)     Hand Dominance Right   Extremity/Trunk Assessment Upper Extremity Assessment Upper Extremity Assessment: Generalized weakness   Lower Extremity Assessment Lower Extremity Assessment: Generalized weakness   Cervical / Trunk Assessment Cervical / Trunk Assessment:  (rounded shoulders, forward head)   Communication Communication Communication: No difficulties   Cognition Arousal/Alertness: Awake/alert Behavior During Therapy: Flat affect Overall Cognitive Status: No family/caregiver present to determine baseline cognitive functioning                                 General Comments: Pt Ox3, not to year                       Home Living Family/patient expects to be discharged to:: Assisted living                                 Additional Comments: Pt reports that he lives in a nursing home and uses both a walker and a wc at baseline      Prior Functioning/Environment Prior Level of Function : Needs assist  Cognitive Assist : ADLs (cognitive)   ADLs (Cognitive): Intermittent cues Physical Assist : Mobility (physical);ADLs (physical) Mobility (physical): Bed  mobility;Transfers;Gait ADLs (physical): Grooming;Bathing;Dressing;Toileting   ADLs Comments: unable to determine specifics with assist levels d/t pt is a poor historian, but pt stated he did require help with basic self care.        OT Problem List: Decreased strength;Decreased activity tolerance;Impaired balance (sitting and/or standing);Decreased safety awareness;Decreased knowledge of use of DME or AE      OT Treatment/Interventions: Self-care/ADL training;Therapeutic exercise;Patient/family education;Balance training;Therapeutic activities;DME and/or AE instruction    OT Goals(Current goals can be found in the care plan section) Acute Rehab OT Goals Patient Stated Goal: To feel better OT Goal Formulation: With patient Time For Goal Achievement: 11/05/21 Potential to Achieve Goals: Good  OT Frequency: Min 2X/week                  AM-PAC OT "6 Clicks" Daily Activity     Outcome Measure Help from another person eating meals?: A Little Help from another person taking care of personal grooming?: A Lot Help from another person toileting, which includes using toliet, bedpan, or urinal?: A Lot Help from another person bathing (including washing, rinsing, drying)?:  A Lot Help from another person to put on and taking off regular upper body clothing?: A Little Help from another person to put on and taking off regular lower body clothing?: A Lot 6 Click Score: 14   End of Session Equipment Utilized During Treatment: Rolling walker (2 wheels) Nurse Communication: Mobility status  Activity Tolerance: Patient tolerated treatment well;No increased pain Patient left: in bed (pt leaving for CT scan)  OT Visit Diagnosis: Unsteadiness on feet (R26.81);Muscle weakness (generalized) (M62.81)                Time: 8347-5830 OT Time Calculation (min): 19 min Charges:  OT General Charges $OT Visit: 1 Visit OT Evaluation $OT Eval Moderate Complexity: 1 Mod OT Treatments $Self Care/Home  Management : 8-22 mins Leta Speller, MS, OTR/L  Darleene Cleaver 10/22/2021, 12:52 PM

## 2021-10-22 NOTE — Progress Notes (Signed)
Mobility Specialist - Progress Note    10/22/21 1300  Mobility  Activity Refused mobility    Pt with PT upon arrival. Will attempt another date and time for ambulation.  Merrily Brittle Mobility Specialist 10/22/21, 1:06 PM

## 2021-10-22 NOTE — Progress Notes (Signed)
Repeat scans today showed increase in distention down to his rectum, no mass seen. Will try rectal tube/prone positioning as well.   Appreciate help from GI, Gen Sx and Radiology.   Gerlean Ren MD

## 2021-10-22 NOTE — Progress Notes (Signed)
PROGRESS NOTE    Calvin [name]  QIH:[id number] DOB: 12/18/1950 DOA: 10/16/2021 PCP: Pcp, No   Brief Narrative:  71 y.o. male with medical history significant of Ogilvie's syndrome, ileus, hypertension, hyperlipidemia, COPD, stroke with mild left-sided weakness, GERD, gout, depression with anxiety, seizure, CAD, MI,  alcohol abuse, CKD stage IIIa, dCHF, colon cancer, thyroid cancer, who presents with nausea vomiting, abdominal pain.    Patient is a poor historian, history is limited. Pt states that has been having abdominal pain for 2 days, associated with nausea, nonbilious nonbloody vomiting several times.  He also has abdominal distention.  Per his daughter (I called her daughter by phone), she noticed patient's abdominal distention several days ago.  Patient states that he has diarrhea, but per nurse observation, patient's stools are formed in the ED.  Patient does not have chest pain, cough, shortness of breath.  Patient has fever 100.4 in ED. patient continues to have ileus.  GI and general surgery following.     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  Currently patient is unclear but due to increasing abdominal distention on my exam, I will repeat another CAT scan today.  Abdominal x-ray this morning did not show much improvement. -GI and general surgery following.  If no return of bowel function by Monday, will plan on starting him on TPN. Patient really needs to mobilize.  Out of bed to chair, will order PT/OT   History of Ogilvie syndrome Laxatives on hold   Possible UTI, felt unlikely Sepsis ruled out -Ucx grew Enteroccocus, On Ampicillin.    CAD (coronary artery disease), no chest pain. -Continue aspirin and statin   COPD (chronic obstructive pulmonary disease) ([place]): Stable -Bronchodilators as needed   Depression with anxiety -Continue home medications when able to  tolerate. Home PO meds - Zoloft and Xanax.    HLD (hyperlipidemia) -Continue statin   HTN (hypertension) -Currently home medications are on hold.  IV hydralazine and Lopressor as needed   Seizure -Seizure precautions.  Currently on IV Keppra   History of Stroke ([place]) -ASA and zocor   Chronic kidney disease, stage 3a ([place]): stable -f/u by BMP   Tobacco abuse: -Nicotine patch    DVT prophylaxis: SQ Heparin Code Status: DNR Family Communication: Marcie Bal updated by me at patient's request  Status is: Inpatient Remains inpatient appropriate because: Maintain hospital until return of bowel function. GI and gen Sx following.   Subjective: This morning patient denies any nausea vomiting.  He thinks he has some bowel sounds.  His last bowel movement was yesterday morning.  To me his belly appears to be slightly more distended this morning.  Examination:  Constitutional: Not in acute distress Respiratory: Clear to auscultation bilaterally Cardiovascular: Normal sinus rhythm, no rubs Abdomen: His abdomen is distended.  Has some bowel sounds. Musculoskeletal: No edema noted Skin: No rashes seen Neurologic: CN 2-12 grossly intact.  And nonfocal Psychiatric: Normal judgment and insight. Alert and oriented x 3. Normal mood.  Objective: Vitals:   10/21/21 1919 10/22/21 0500 10/22/21 0501 10/22/21 0758  BP: (!) 151/89  136/77 (!) 143/84  Pulse: 63  60 63  Resp: 20  20 18   Temp: 97.8 F (36.6 C)  (!) 97.5 F (36.4 C) 98.5 F (36.9 C)  TempSrc: Oral  Oral   SpO2: 99%  98% 100%  Weight:  98.5 kg      Intake/Output Summary (Last 24 hours) at  10/22/2021 1112 Last data filed at 10/22/2021 0612 Gross per 24 hour  Intake 1124.9 ml  Output 800 ml  Net 324.9 ml   Filed Weights   10/19/21 0459 10/20/21 0440 10/22/21 0500  Weight: 92 kg 94 kg 98.5 kg     Data Reviewed:   CBC: Recent Labs  Lab 10/16/21 0700 10/17/21 0429 10/20/21 0553 10/21/21 0600 10/22/21 0300  WBC 7.6  6.4 4.4 3.9* 3.4*  NEUTROABS 5.6  --   --   --   --   HGB 14.3 12.9* 12.5* 12.0* 11.3*  HCT 45.7 40.9 38.8* 36.4* 34.8*  MCV 97.4 97.4 93.7 93.1 93.3  PLT 282 208 180 155 664   Basic Metabolic Panel: Recent Labs  Lab 10/16/21 0700 10/17/21 0429 10/20/21 0553 10/21/21 0600 10/22/21 0530  NA 138 144 142 142 142  K 3.5 4.0 3.6 3.6 4.4  CL 95* 103 112* 115* 112*  CO2 31 28 21* 21* 23  GLUCOSE 108* 89 98 99 99  BUN 17 17 15 12 9   CREATININE 1.32* 1.18 1.01 0.99 1.10  CALCIUM 9.9 8.8* 8.2* 8.4* 8.6*  MG  --   --  2.1 1.9 1.9   GFR: Estimated Creatinine Clearance: 73.2 mL/min (by C-G formula based on SCr of 1.1 mg/dL). Liver Function Tests: Recent Labs  Lab 10/16/21 0700  AST 20  ALT 20  ALKPHOS 67  BILITOT 0.6  PROT 7.7  ALBUMIN 4.3   Recent Labs  Lab 10/16/21 0700  LIPASE 34   No results for input(s): AMMONIA in the last 168 hours. Coagulation Profile: Recent Labs  Lab 10/16/21 0700  INR 1.0   Cardiac Enzymes: No results for input(s): CKTOTAL, CKMB, CKMBINDEX, TROPONINI in the last 168 hours. BNP (last 3 results) No results for input(s): PROBNP in the last 8760 hours. HbA1C: No results for input(s): HGBA1C in the last 72 hours. CBG: Recent Labs  Lab 10/21/21 1128 10/21/21 1140 10/21/21 1744 10/21/21 2356 10/22/21 0458  GLUCAP 93 86 115* 93 94   Lipid Profile: No results for input(s): CHOL, HDL, LDLCALC, TRIG, CHOLHDL, LDLDIRECT in the last 72 hours. Thyroid Function Tests: No results for input(s): TSH, T4TOTAL, FREET4, T3FREE, THYROIDAB in the last 72 hours. Anemia Panel: No results for input(s): VITAMINB12, FOLATE, FERRITIN, TIBC, IRON, RETICCTPCT in the last 72 hours. Sepsis Labs: Recent Labs  Lab 10/16/21 0700 10/16/21 0743  PROCALCITON  --  <0.10  LATICACIDVEN 1.3  --     Recent Results (from the past 240 hour(s))  Resp Panel by RT-PCR (Flu A&B, Covid) Nasopharyngeal Swab     Status: None   Collection Time: 10/16/21  6:43 AM    Specimen: Nasopharyngeal Swab; Nasopharyngeal(NP) swabs in vial transport medium  Result Value Ref Range Status   SARS Coronavirus 2 by RT PCR NEGATIVE NEGATIVE Final    Comment: (NOTE) SARS-CoV-2 target nucleic acids are NOT DETECTED.  The SARS-CoV-2 RNA is generally detectable in upper respiratory specimens during the acute phase of infection. The lowest concentration of SARS-CoV-2 viral copies this assay can detect is 138 copies/mL. A negative result does not preclude SARS-Cov-2 infection and should not be used as the sole basis for treatment or other patient management decisions. A negative result may occur with  improper specimen collection/handling, submission of specimen other than nasopharyngeal swab, presence of viral mutation(s) within the areas targeted by this assay, and inadequate number of viral copies(<138 copies/mL). A negative result must be combined with clinical observations, patient history, and epidemiological information.  The expected result is Negative.  Fact Sheet for Patients:  EntrepreneurPulse.com.au  Fact Sheet for Healthcare Providers:  IncredibleEmployment.be  This test is no t yet approved or cleared by the [place] FDA and  has been authorized for detection and/or diagnosis of SARS-CoV-2 by FDA under an Emergency Use Authorization (EUA). This EUA will remain  in effect (meaning this test can be used) for the duration of the COVID-19 declaration under Section 564(b)(1) of the Act, 21 U.S.C.section 360bbb-3(b)(1), unless the authorization is terminated  or revoked sooner.       Influenza A by PCR NEGATIVE NEGATIVE Final   Influenza B by PCR NEGATIVE NEGATIVE Final    Comment: (NOTE) The Xpert Xpress SARS-CoV-2/FLU/RSV plus assay is intended as an aid in the diagnosis of influenza from Nasopharyngeal swab specimens and should not be used as a sole basis for treatment. Nasal washings and aspirates are  unacceptable for Xpert Xpress SARS-CoV-2/FLU/RSV testing.  Fact Sheet for Patients: EntrepreneurPulse.com.au  Fact Sheet for Healthcare Providers: IncredibleEmployment.be  This test is not yet approved or cleared by the [place] FDA and has been authorized for detection and/or diagnosis of SARS-CoV-2 by FDA under an Emergency Use Authorization (EUA). This EUA will remain in effect (meaning this test can be used) for the duration of the COVID-19 declaration under Section 564(b)(1) of the Act, 21 U.S.C. section 360bbb-3(b)(1), unless the authorization is terminated or revoked.  Performed at [facility], [address] [place], [place] [address]   Blood culture (routine single)     Status: None   Collection Time: 10/16/21  7:00 AM   Specimen: BLOOD  Result Value Ref Range Status   Specimen Description BLOOD ARM  Final   Special Requests   Final    BOTTLES DRAWN AEROBIC AND ANAEROBIC Blood Culture adequate volume   Culture   Final    NO GROWTH 5 DAYS Performed at [facility], [address], [place], [place] [address]    Report Status 10/21/2021 FINAL  Final  Urine Culture     Status: Abnormal   Collection Time: 10/16/21 10:24 AM   Specimen: In/Out Cath Urine  Result Value Ref Range Status   Specimen Description   Final    IN/OUT CATH URINE Performed at [facility], [address], [place], [place] [address]    Special Requests   Final    NONE Performed at [facility], [place], [place] [address]    Culture 60,000 COLONIES/mL ENTEROCOCCUS FAECALIS (A)  Final   Report Status 10/19/2021 FINAL  Final   Organism ID, Bacteria ENTEROCOCCUS FAECALIS (A)  Final      Susceptibility   Enterococcus faecalis - MIC*    AMPICILLIN <=2 SENSITIVE Sensitive     NITROFURANTOIN <=16 SENSITIVE Sensitive     VANCOMYCIN 1 SENSITIVE Sensitive     * 60,000 COLONIES/mL ENTEROCOCCUS FAECALIS  C  Difficile Quick Screen w PCR reflex     Status: None   Collection Time: 10/16/21  1:39 PM   Specimen: STOOL  Result Value Ref Range Status   C Diff antigen NEGATIVE NEGATIVE Final   C Diff toxin NEGATIVE NEGATIVE Final   C Diff interpretation No C. difficile detected.  Final    Comment: Performed at [facility], [address] [place], [place] [address]  Gastrointestinal Panel by PCR , Stool     Status: None   Collection Time: 10/16/21  1:39 PM   Specimen: STOOL  Result  Value Ref Range Status   Campylobacter species NOT DETECTED NOT DETECTED Final   Plesimonas shigelloides NOT DETECTED NOT DETECTED Final   Salmonella species NOT DETECTED NOT DETECTED Final   Yersinia enterocolitica NOT DETECTED NOT DETECTED Final   Vibrio species NOT DETECTED NOT DETECTED Final   Vibrio cholerae NOT DETECTED NOT DETECTED Final   Enteroaggregative E coli (EAEC) NOT DETECTED NOT DETECTED Final   Enteropathogenic E coli (EPEC) NOT DETECTED NOT DETECTED Final   Enterotoxigenic E coli (ETEC) NOT DETECTED NOT DETECTED Final   Shiga like toxin producing E coli (STEC) NOT DETECTED NOT DETECTED Final   Shigella/Enteroinvasive E coli (EIEC) NOT DETECTED NOT DETECTED Final   Cryptosporidium NOT DETECTED NOT DETECTED Final   Cyclospora cayetanensis NOT DETECTED NOT DETECTED Final   Entamoeba histolytica NOT DETECTED NOT DETECTED Final   Giardia lamblia NOT DETECTED NOT DETECTED Final   Adenovirus F40/41 NOT DETECTED NOT DETECTED Final   Astrovirus NOT DETECTED NOT DETECTED Final   Norovirus GI/GII NOT DETECTED NOT DETECTED Final   Rotavirus A NOT DETECTED NOT DETECTED Final   Sapovirus (I, II, IV, and V) NOT DETECTED NOT DETECTED Final    Comment: Performed at [facility], [address] [place], [place] [address]  MRSA Next Gen by PCR, Nasal     Status: None   Collection Time: 10/17/21  4:15 AM   Specimen: Nasal Mucosa; Nasal Swab  Result Value Ref Range Status   MRSA by PCR Next Gen  NOT DETECTED NOT DETECTED Final    Comment: (NOTE) The GeneXpert MRSA Assay (FDA approved for NASAL specimens only), is one component of a comprehensive MRSA colonization surveillance program. It is not intended to diagnose MRSA infection nor to guide or monitor treatment for MRSA infections. Test performance is not FDA approved in patients less than 52 years old. Performed at [facility], [address] [place], [place] [address]          Radiology Studies: DG Abd 1 View  Result Date: 10/21/2021 CLINICAL DATA:  Abdominal distention EXAM: ABDOMEN - 1 VIEW COMPARISON:  Previous studies including the examination done on 10/19/2021 FINDINGS: There is marked gaseous distention of colon measuring up to 10 cm in diameter. There is oral contrast administered for previous CT in the the right colon and rectum. There is no abnormal dilation of small-bowel loops. There is no gastric distention. No abnormal masses or calcifications are seen. Kidneys are partly obscured by bowel contents. Subsegmental atelectasis is seen in the right lower lung fields. IMPRESSION: Gaseous distention of colon has not changed significantly suggesting ileus. Linear densities in the right lower lung fields suggest subsegmental atelectasis. Electronically Signed   By: Elmer Picker M.D.   On: 10/21/2021 13:31        Scheduled Meds:  (feeding supplement) PROSource Plus  30 mL Oral BID BM   ALPRAZolam  0.5 mg Oral BID   aspirin  81 mg Oral Daily   B-complex with vitamin C  1 tablet Oral Daily   calcium-vitamin D  1 tablet Oral BID   chewing gum (ORBIT) sugar free  1 Stick Oral TID   feeding supplement  1 Container Oral TID BM   gabapentin  800 mg Oral TID   heparin  5,000 Units Subcutaneous Q8H   lisinopril  5 mg Oral Daily   methocarbamol  500 mg Oral Q8H   metoprolol succinate  25 mg Oral Daily   multivitamin with minerals  1 tablet Oral Daily  nicotine  21 mg Transdermal Daily   pantoprazole  (PROTONIX) IV  40 mg Intravenous Q24H   sertraline  50 mg Oral Daily   tamsulosin  0.4 mg Oral QPM   tiotropium  18 mcg Inhalation Daily   Continuous Infusions:  ampicillin (OMNIPEN) IV 1 g (10/22/21 0900)   dextrose 5 % and 0.45% NaCl 75 mL/hr at 10/22/21 0244   levETIRAcetam 1,000 mg (10/22/21 0044)     LOS: 6 days   Time spent= 35 mins    Tushar Enns Arsenio Loader, MD [facility]  If 7PM-7AM, please contact night-coverage  10/22/2021, 11:12 AM

## 2021-10-22 NOTE — Progress Notes (Signed)
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  He had been seen by Dr. Lysle Pearl during this admission but has also been seen by other surgical providers in the past.  He had a repeat CT scan today because of worsening abdominal distention, and it showed concerning findings for a possible area of narrowing in the proximal rectum.  He does have a history of colon cancer and is s/p hand assisted lap sigmoidectomy in 2012.  However, this area of narrowing has not been seen in prior CT scans, and even on the one that he had on admission.  Patient denies any worsening pain and reports that he has had some bowel movements.  Last imaging prior to this CT scan was a KUB which did show contrast in the rectum.  Vital signs: Temp:  [97.3 F (36.3 C)-98.5 F (36.9 C)] 97.5 F (36.4 C) (02/18 1936) Pulse Rate:  [60-68] 68 (02/18 1936) Resp:  [18-20] 20 (02/18 1936) BP: (136-171)/(77-91) 171/91 (02/18 1936) SpO2:  [98 %-100 %] 98 % (02/18 1936) Weight:  [98.5 kg] 98.5 kg (02/18 0500)   Intake/Output: 02/17 0701 - 02/18 0700 In: 1244.9 [P.O.:840; I.V.:4.9; IV Piggyback:400] Out: 800 [Urine:800] Last BM Date : 10/21/21  Physical Exam: Constitutional:  No acute distress Abdomen:  soft, distended with tympany, nontender to palpation.  Labs:  Recent Labs    10/21/21 0600 10/22/21 0300  WBC 3.9* 3.4*  HGB 12.0* 11.3*  HCT 36.4* 34.8*  PLT 155 165   Recent Labs    10/21/21 0600 10/22/21 0530  NA 142 142  K 3.6 4.4  CL 115* 112*  CO2 21* 23  GLUCOSE 99 99  BUN 12 9  CREATININE 0.99 1.10  CALCIUM 8.4* 8.6*   No results for input(s): LABPROT, INR in the last 72 hours.  Imaging: CT ABDOMEN PELVIS WO CONTRAST  Result Date: 10/22/2021 CLINICAL DATA:  Evaluate for bowel perforation or peritonitis. EXAM: CT ABDOMEN AND PELVIS WITHOUT CONTRAST TECHNIQUE: Multidetector CT imaging of the abdomen and pelvis was performed following the standard protocol without IV  contrast. RADIATION DOSE REDUCTION: This exam was performed according to the departmental dose-optimization program which includes automated exposure control, adjustment of the mA and/or kV according to patient size and/or use of iterative reconstruction technique. COMPARISON:  10/16/2021 FINDINGS: Lower chest: Trace right pleural effusion with overlying atelectasis. Mild subsegmental atelectasis identified in the left base. Hepatobiliary: There is no suspicious liver abnormality. Several calcified granulomas noted. Small gallstones are noted layering within the dependent portion of the gallbladder. No signs of gallbladder wall inflammation or bile duct dilatation. Pancreas: Unremarkable. No pancreatic ductal dilatation or surrounding inflammatory changes. Spleen: Normal in size without focal abnormality. Adrenals/Urinary Tract: Bilateral low-attenuation adrenal nodules are unchanged from 09/20/2019 compatible with benign adenomas. Bilateral renal cortical scarring noted. No nephrolithiasis, mass, or hydronephrosis. No hydroureter identified. Circumferential wall thickening involving the partially decompressed urinary bladder noted. Stomach/Bowel: Stomach appears nondistended. No small bowel wall thickening, inflammation, or distension. The appendix is visualized and appears within normal limits, image 63/2. There is diffuse colonic distension from the proximal cecum up to the mid rectum. This has maximum diameter 9.8 cm, image 36/3. This is compared with 9.5 cm on 10/16/2021. There is an abrupt transition within the mid rectum, image 92/3. Here, there is marked decreased luminal narrowing with suspected underlying colonic circumferential apple-core lesion measuring approximately 2.6 by 2.6 cm, image 88/4 and image 79/2. This is approximately 6 cm from the anal verge, image 87/4.  Some enteric contrast material is noted distal to this short segment. Vascular/Lymphatic: Aortic atherosclerosis. No aneurysm. No  abdominopelvic adenopathy. Reproductive: Prostate is unremarkable. Other: No free fluid or fluid collections identified. No pneumoperitoneum identified. Musculoskeletal: No acute or suspicious osseous findings. The bones appear diffusely osteopenic. Previous left hip arthroplasty. IMPRESSION: 1. There is diffuse colonic distension from the proximal cecum up to the mid rectum. There is an abrupt transition within the mid rectum with suspected underlying circumferential apple-core lesion measuring approximately 2.6 cm from the anal verge. 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 Further investigation with colonoscopy is recommended. 2. No evidence for bowel perforation or abscess. 3. Gallstones. 4. Trace right pleural effusion with overlying atelectasis. 5. Aortic Atherosclerosis (ICD10-I70.0). Electronically Signed   By: Kerby Moors M.D.   On: 10/22/2021 14:48   CT PELVIS WO CONTRAST  Result Date: 10/22/2021 CLINICAL DATA:  Question of rectal lesion on CT scan earlier today. EXAM: CT PELVIS WITHOUT CONTRAST TECHNIQUE: Multidetector CT imaging of the pelvis was performed following the standard protocol without intravenous contrast. RADIATION DOSE REDUCTION: This exam was performed according to the departmental dose-optimization program which includes automated exposure control, adjustment of the mA and/or kV according to patient size and/or use of iterative reconstruction technique. COMPARISON:  CT scan from earlier today. FINDINGS: Urinary Tract:  Unremarkable. Bowel: As noted on prior studies, there is diffuse gaseous dilatation of colon. The area of focal wall thickening and luminal narrowing seen in the rectum earlier today is less prominent on the supine scan. Patient was also scanned in the prone position to allow this portion of the colon to become non dependent and filled with gas. Prone imaging is demonstrated on series 12 and the rectum is diffusely well distended without wall thickening  or obstructing lesion. Sagittal reconstructions (see image 97 of series 14) show good distension of the sigmoid colon and rectum to the level of the anal verge. Vascular/Lymphatic: Atherosclerotic calcification noted in the distal aorta and common iliac arteries. No pelvic sidewall lymphadenopathy. Reproductive: The prostate gland and seminal vesicles are unremarkable. Other:  No intraperitoneal free fluid. Musculoskeletal: Status post left total hip replacement. IMPRESSION: 1. The area of focal wall thickening and luminal narrowing seen in the rectum earlier today is less prominent on the repeat supine imaging and resolves completely with prone imaging. Sigmoid colon and rectum is well distended on the prone imaging. There is no rectal soft tissue mass or evidence of rectal stricture. 2. Diffuse gaseous dilatation of the colon. 3. Status post left total hip replacement. 4. Aortic Atherosclerosis (ICD10-I70.0). Electronically Signed   By: Misty Stanley M.D.   On: 10/22/2021 17:46    Assessment/Plan: This is a 71 y.o. male with Ogilvie's syndrome and potential colorectal narrowing.  --The patient's first CT scan today showed possible narrowing or applecore lesion at the colorectal area.  This is near the anastomosis from his prior surgery, but cannot see the staple line too clearly on CT.  However, this is not seen on any of his recent CT scans, so I think this may be contraction of the rectum that is being seen.  I asked Dr. Reesa Chew to order repeat pelvic CT scan and this was done in both supine and prone position per radiology.  This showed that the rectum distends well without any narrowing or masses.   --There is no mass or obstruction process.  Patient has Ogilvie's syndrome.  Would recommend conservative management with different positional changes such as prone and lateral  decubitus as tolerated, or may need promotility agents such as neostigmine vs colonic decompression.  Rectal tube is being place to see  if this helps with the decompression first. --No acute surgical need at this point.  Will check on him tomorrow as well.   I spent 35 minutes dedicated to the care of this patient on the date of this encounter to include pre-visit review of records, face-to-face time with the patient discussing diagnosis and management, and any post-visit coordination of care.  Melvyn Neth, [facility]

## 2021-10-22 NOTE — Evaluation (Addendum)
Physical Therapy Evaluation Patient Details Name: Calvin [name] MRN: [id number] DOB: 1951-05-18 Today's Date: 10/22/2021  History of Present Illness  Pt is a 71 y/o M admitted on 10/16/21 after presenting with c/c of N&V & abdominal pain. Pt is being treated for abdominal pain 2/2 partial SBO/ileus.   PMH: Ogilvie's syndrome, ileus, HTN, HLD, COPD, stroke with mild L sided weakness, GERD, gout, depression with anxiety, seizure, CAD, MI, alcohol abuse, CKD stage 3a, dCHF, colon CA, thyroid CA  Clinical Impression  Pt seen for PT evaluation with pt reporting he resides at [facility] "nursing home". Pt reports he was independent with bed mobility & could ambulate to bathroom with RW without assistance, otherwise he used a w/c. On this date, pt required max assist for bed mobility with significant reliance on hospital bed features (HOB fully elevated, use of bed rails) with cuing for technique. Pt is able to transfer to standing with max assist but only requires CGA for gait in room. Pt demonstrates impaired gait pattern as noted below, requiring ongoing cuing for upright posture. Will continue to follow pt acutely to address balance, endurance, & gait with LRAD to help facilitate return to PLOF.       Recommendations for follow up therapy are one component of a multi-disciplinary discharge planning process, led by the attending physician.  Recommendations may be updated based on patient status, additional functional criteria and insurance authorization.  Follow Up Recommendations Home health PT    Assistance Recommended at Discharge Frequent or constant Supervision/Assistance  Patient can return home with the following  A lot of help with walking and/or transfers;A little help with walking and/or transfers (a little help with walking, a lot of help with transfers)    Equipment Recommendations None recommended by PT  Recommendations for Other Services       Functional Status Assessment Patient has  had a recent decline in their functional status and demonstrates the ability to make significant improvements in function in a reasonable and predictable amount of time.     Precautions / Restrictions Precautions Precautions: Fall Precaution Comments: HOB > 30 degrees Restrictions Weight Bearing Restrictions: No      Mobility  Bed Mobility Overal bed mobility: Needs Assistance Bed Mobility: Supine to Sit     Supine to sit: Max assist, HOB elevated     General bed mobility comments: cuing for hand placement & use of bed rails, assistance to move BLE to EOB but pt able to upright trunk with HOB completely elevated    Transfers     Transfers: Sit to/from Stand Sit to Stand: Max assist           General transfer comment: max assist for sit>stand from EOB    Ambulation/Gait Ambulation/Gait assistance: Min guard Gait Distance (Feet): 30 Feet   Gait Pattern/deviations: Decreased step length - right, Decreased step length - left, Decreased stride length, Decreased dorsiflexion - right, Decreased dorsiflexion - left Gait velocity: decreased     General Gait Details: decreased heel strike with forward trunk flexion with pt beginning to push RW a little out in front of him with cuing to correct & ambualte within base of AD with upright posture  Stairs            Wheelchair Mobility    Modified Rankin (Stroke Patients Only)       Balance Overall balance assessment: Needs assistance Sitting-balance support: Feet supported Sitting balance-Leahy Scale: Fair Sitting balance - Comments: supervision static sitting EOB  Standing balance support: Bilateral upper extremity supported, Reliant on assistive device for balance, During functional activity Standing balance-Leahy Scale: Fair                               Pertinent Vitals/Pain Pain Assessment Pain Assessment:  (Initially denies pain then with fluctuating reports of pt stating he has L sided  facial, chest, & abdominal pain - nurse & MD made aware)    Home Living Family/patient expects to be discharged to::  (long term care facility)                   Additional Comments: Pt reports he lives at [facility]    Prior Function Prior Level of Function : Needs assist  Cognitive Assist : ADLs (cognitive)   ADLs (Cognitive): Intermittent cues Physical Assist : Mobility (physical);ADLs (physical) Mobility (physical): Bed mobility;Transfers;Gait ADLs (physical): Grooming;Bathing;Dressing;Toileting Mobility Comments: Pt reports prior to admission he could complete bed mobility without assistance & ambulate to the bathroom with a walker without assistance, otherwise pt uses a w/c. ADLs Comments: unable to determine specifics with assist levels d/t pt is a poor historian, but pt stated he did require help with basic self care.     Hand Dominance   Dominant Hand: Right    Extremity/Trunk Assessment   Upper Extremity Assessment Upper Extremity Assessment: Generalized weakness LUE Deficits / Details: L sided weakness at baseline from old CVA    Lower Extremity Assessment Lower Extremity Assessment: Generalized weakness    Cervical / Trunk Assessment Cervical / Trunk Assessment:  (rounded shoulders, forward head)  Communication   Communication: No difficulties  Cognition Arousal/Alertness: Awake/alert Behavior During Therapy: Flat affect Overall Cognitive Status: Within Functional Limits for tasks assessed                                 General Comments: Appears to be at baseline level of funciton; attempted to ask pt orientation questions but his brother answered them instead        General Comments      Exercises     Assessment/Plan    PT Assessment Patient needs continued PT services  PT Problem List Decreased strength;Decreased mobility;Decreased safety awareness;Decreased activity tolerance;Decreased balance;Cardiopulmonary status limiting  activity       PT Treatment Interventions DME instruction;Therapeutic exercise;Gait training;Balance training;Neuromuscular re-education;Functional mobility training;Patient/family education;Therapeutic activities;Modalities;Manual techniques    PT Goals (Current goals can be found in the Care Plan section)  Acute Rehab PT Goals Patient Stated Goal: feel better PT Goal Formulation: With patient Time For Goal Achievement: 11/05/21 Potential to Achieve Goals: Good    Frequency Min 2X/week     Co-evaluation               AM-PAC PT "6 Clicks" Mobility  Outcome Measure Help needed turning from your back to your side while in a flat bed without using bedrails?: A Little Help needed moving from lying on your back to sitting on the side of a flat bed without using bedrails?: Total Help needed moving to and from a bed to a chair (including a wheelchair)?: A Little Help needed standing up from a chair using your arms (e.g., wheelchair or bedside chair)?: A Lot Help needed to walk in hospital room?: A Little Help needed climbing 3-5 steps with a railing? : Total 6 Click Score: 13    End  of Session   Activity Tolerance: Patient tolerated treatment well Patient left: in chair;with chair alarm set;with call bell/phone within reach;with family/visitor present Nurse Communication: Mobility status PT Visit Diagnosis: Muscle weakness (generalized) (M62.81);Unsteadiness on feet (R26.81)    Time: 1027-2536 PT Time Calculation (min) (ACUTE ONLY): 19 min   Charges:   PT Evaluation $PT Eval Low Complexity: [place], PT, DPT 10/22/21, 2:47 PM   [name] 10/22/2021, 2:45 PM

## 2021-10-22 NOTE — Plan of Care (Signed)

## 2021-10-22 NOTE — Plan of Care (Signed)
  Problem: Health Behavior/Discharge Planning: Goal: Ability to manage health-related needs will improve Outcome: Progressing   Problem: Clinical Measurements: Goal: Ability to maintain clinical measurements within normal limits will improve Outcome: Progressing   

## 2021-10-23 DIAGNOSIS — K567 Ileus, unspecified: Secondary | ICD-10-CM

## 2021-10-23 DIAGNOSIS — R140 Abdominal distension (gaseous): Secondary | ICD-10-CM | POA: Diagnosis not present

## 2021-10-23 DIAGNOSIS — I2510 Atherosclerotic heart disease of native coronary artery without angina pectoris: Secondary | ICD-10-CM | POA: Diagnosis not present

## 2021-10-23 DIAGNOSIS — J449 Chronic obstructive pulmonary disease, unspecified: Secondary | ICD-10-CM | POA: Diagnosis not present

## 2021-10-23 DIAGNOSIS — K5981 Ogilvie syndrome: Secondary | ICD-10-CM | POA: Diagnosis not present

## 2021-10-23 DIAGNOSIS — R109 Unspecified abdominal pain: Secondary | ICD-10-CM | POA: Diagnosis not present

## 2021-10-23 LAB — CBC
HCT: 38.6 % — ABNORMAL LOW (ref 39.0–52.0)
Hemoglobin: 12.3 g/dL — ABNORMAL LOW (ref 13.0–17.0)
MCH: 29.8 pg (ref 26.0–34.0)
MCHC: 31.9 g/dL (ref 30.0–36.0)
MCV: 93.5 fL (ref 80.0–100.0)
Platelets: 181 10*3/uL (ref 150–400)
RBC: 4.13 MIL/uL — ABNORMAL LOW (ref 4.22–5.81)
RDW: 12.4 % (ref 11.5–15.5)
WBC: 3.3 10*3/uL — ABNORMAL LOW (ref 4.0–10.5)
nRBC: 0 % (ref 0.0–0.2)

## 2021-10-23 LAB — BASIC METABOLIC PANEL
Anion gap: 8 (ref 5–15)
BUN: 8 mg/dL (ref 8–23)
CO2: 21 mmol/L — ABNORMAL LOW (ref 22–32)
Calcium: 8.7 mg/dL — ABNORMAL LOW (ref 8.9–10.3)
Chloride: 112 mmol/L — ABNORMAL HIGH (ref 98–111)
Creatinine, Ser: 0.92 mg/dL (ref 0.61–1.24)
GFR, Estimated: >60 mL/min (ref >60)
Glucose, Bld: 105 mg/dL — ABNORMAL HIGH (ref 70–99)
Potassium: 3.7 mmol/L (ref 3.5–5.1)
Sodium: 141 mmol/L (ref 135–145)

## 2021-10-23 LAB — MAGNESIUM: Magnesium: 1.7 mg/dL (ref 1.7–2.4)

## 2021-10-23 LAB — GLUCOSE, CAPILLARY
Glucose-Capillary: 93 mg/dL (ref 70–99)
Glucose-Capillary: 91 mg/dL (ref 70–99)
Glucose-Capillary: 80 mg/dL (ref 70–99)

## 2021-10-23 NOTE — Progress Notes (Signed)
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  Denies any complaints   Objective: Vital signs in last 24 hours: Vitals:   10/22/21 1936 10/22/21 2351 10/23/21 0517 10/23/21 0749  BP: (!) 171/91 (!) 104/59 122/74 128/83  Pulse: 68 79 70 63  Resp: 20 16 16 18   Temp: (!) 97.5 F (36.4 C) (!) 97.5 F (36.4 C) (!) 97.5 F (36.4 C) 98.1 F (36.7 C)  TempSrc: Oral Oral Oral   SpO2: 98% 98% 99% 100%  Weight:       Weight change:   Intake/Output Summary (Last 24 hours) at 10/23/2021 1287 Last data filed at 10/23/2021 0536 Gross per 24 hour  Intake --  Output 1400 ml  Net -1400 ml     Exam: Heart:: Regular rate and rhythm Lungs: normal Abdomen: Distended, no tenderness, no guarding or rigidity faint bowel sounds tympanic on percussion   Lab Results: @LABTEST2 @ Micro Results: Recent Results (from the past 240 hour(s))  Resp Panel by RT-PCR (Flu A&B, Covid) Nasopharyngeal Swab     Status: None   Collection Time: 10/16/21  6:43 AM   Specimen: Nasopharyngeal Swab; Nasopharyngeal(NP) swabs in vial transport medium  Result Value Ref Range Status   SARS Coronavirus 2 by RT PCR NEGATIVE NEGATIVE Final    Comment: (NOTE) SARS-CoV-2 target nucleic acids are NOT DETECTED.  The SARS-CoV-2 RNA is generally detectable in upper respiratory specimens during the acute phase of infection. The lowest concentration of SARS-CoV-2 viral copies this assay can detect is 138 copies/mL. A negative result does not preclude SARS-Cov-2 infection and should not be used as the sole basis for treatment or other patient management decisions. A negative result may occur with  improper specimen collection/handling, submission of specimen other than nasopharyngeal  swab, presence of viral mutation(s) within the areas targeted by this assay, and inadequate number of viral copies(<138 copies/mL). A negative result must be combined with clinical observations, patient history, and epidemiological information. The expected result is Negative.  Fact Sheet for Patients:  EntrepreneurPulse.com.au  Fact Sheet for Healthcare Providers:  IncredibleEmployment.be  This test is no t yet approved or cleared by the [place] FDA and  has been authorized for detection and/or diagnosis of SARS-CoV-2 by FDA under an Emergency Use Authorization (EUA). This EUA will remain  in effect (meaning this test can be used) for the duration of the COVID-19 declaration under Section 564(b)(1) of the Act, 21 U.S.C.section 360bbb-3(b)(1), unless the authorization is terminated  or revoked sooner.       Influenza A by PCR NEGATIVE NEGATIVE Final   Influenza B by PCR NEGATIVE NEGATIVE Final    Comment: (NOTE) The Xpert Xpress SARS-CoV-2/FLU/RSV plus assay is intended as an aid in the diagnosis of influenza from Nasopharyngeal swab specimens and should not be used as a sole basis for treatment. Nasal washings and aspirates are unacceptable for Xpert Xpress SARS-CoV-2/FLU/RSV testing.  Fact Sheet for Patients: EntrepreneurPulse.com.au  Fact Sheet for Healthcare Providers: IncredibleEmployment.be  This test is not yet approved or cleared by the [place] FDA and has been authorized for detection and/or diagnosis of SARS-CoV-2 by FDA under an Emergency Use Authorization (EUA). This EUA will remain in effect (meaning this test can be used) for the duration of the COVID-19 declaration under  Section 564(b)(1) of the Act, 21 U.S.C. section 360bbb-3(b)(1), unless the authorization is terminated or revoked.  Performed at [facility], [address] [place], [place] [address]   Blood  culture (routine single)     Status: None   Collection Time: 10/16/21  7:00 AM   Specimen: BLOOD  Result Value Ref Range Status   Specimen Description BLOOD ARM  Final   Special Requests   Final    BOTTLES DRAWN AEROBIC AND ANAEROBIC Blood Culture adequate volume   Culture   Final    NO GROWTH 5 DAYS Performed at [facility], [address] [place], [place] [address]    Report Status 10/21/2021 FINAL  Final  Urine Culture     Status: Abnormal   Collection Time: 10/16/21 10:24 AM   Specimen: In/Out Cath Urine  Result Value Ref Range Status   Specimen Description   Final    IN/OUT CATH URINE Performed at [facility], [address], [place], [place] [address]    Special Requests   Final    NONE Performed at [facility], [place], [place] [address]    Culture 60,000 COLONIES/mL ENTEROCOCCUS FAECALIS (A)  Final   Report Status 10/19/2021 FINAL  Final   Organism ID, Bacteria ENTEROCOCCUS FAECALIS (A)  Final      Susceptibility   Enterococcus faecalis - MIC*    AMPICILLIN <=2 SENSITIVE Sensitive     NITROFURANTOIN <=16 SENSITIVE Sensitive     VANCOMYCIN 1 SENSITIVE Sensitive     * 60,000 COLONIES/mL ENTEROCOCCUS FAECALIS  C Difficile Quick Screen w PCR reflex     Status: None   Collection Time: 10/16/21  1:39 PM   Specimen: STOOL  Result Value Ref Range Status   C Diff antigen NEGATIVE NEGATIVE Final   C Diff toxin NEGATIVE NEGATIVE Final   C Diff interpretation No C. difficile detected.  Final    Comment: Performed at [facility], [address] [place], [place] [address]  Gastrointestinal Panel by PCR , Stool     Status: None   Collection Time: 10/16/21  1:39 PM   Specimen: STOOL  Result Value Ref Range Status   Campylobacter species NOT DETECTED NOT DETECTED Final   Plesimonas shigelloides NOT DETECTED NOT DETECTED Final   Salmonella species NOT DETECTED NOT DETECTED Final   Yersinia enterocolitica NOT DETECTED  NOT DETECTED Final   Vibrio species NOT DETECTED NOT DETECTED Final   Vibrio cholerae NOT DETECTED NOT DETECTED Final   Enteroaggregative E coli (EAEC) NOT DETECTED NOT DETECTED Final   Enteropathogenic E coli (EPEC) NOT DETECTED NOT DETECTED Final   Enterotoxigenic E coli (ETEC) NOT DETECTED NOT DETECTED Final   Shiga like toxin producing E coli (STEC) NOT DETECTED NOT DETECTED Final   Shigella/Enteroinvasive E coli (EIEC) NOT DETECTED NOT DETECTED Final   Cryptosporidium NOT DETECTED NOT DETECTED Final   Cyclospora cayetanensis NOT DETECTED NOT DETECTED Final   Entamoeba histolytica NOT DETECTED NOT DETECTED Final   Giardia lamblia NOT DETECTED NOT DETECTED Final   Adenovirus F40/41 NOT DETECTED NOT DETECTED Final   Astrovirus NOT DETECTED NOT DETECTED Final   Norovirus GI/GII NOT DETECTED NOT DETECTED Final   Rotavirus A NOT DETECTED NOT DETECTED Final   Sapovirus (I, II, IV, and V) NOT DETECTED NOT DETECTED Final    Comment: Performed at [facility], [address] [place], [place] [address]  MRSA Next Gen by PCR, Nasal  Status: None   Collection Time: 10/17/21  4:15 AM   Specimen: Nasal Mucosa; Nasal Swab  Result Value Ref Range Status   MRSA by PCR Next Gen NOT DETECTED NOT DETECTED Final    Comment: (NOTE) The GeneXpert MRSA Assay (FDA approved for NASAL specimens only), is one component of a comprehensive MRSA colonization surveillance program. It is not intended to diagnose MRSA infection nor to guide or monitor treatment for MRSA infections. Test performance is not FDA approved in patients less than 45 years old. Performed at [facility], [address] [place], [place] [address]    Studies/Results: CT ABDOMEN PELVIS WO CONTRAST  Result Date: 10/22/2021 CLINICAL DATA:  Evaluate for bowel perforation or peritonitis. EXAM: CT ABDOMEN AND PELVIS WITHOUT CONTRAST TECHNIQUE: Multidetector CT imaging of the abdomen and pelvis was performed following  the standard protocol without IV contrast. RADIATION DOSE REDUCTION: This exam was performed according to the departmental dose-optimization program which includes automated exposure control, adjustment of the mA and/or kV according to patient size and/or use of iterative reconstruction technique. COMPARISON:  10/16/2021 FINDINGS: Lower chest: Trace right pleural effusion with overlying atelectasis. Mild subsegmental atelectasis identified in the left base. Hepatobiliary: There is no suspicious liver abnormality. Several calcified granulomas noted. Small gallstones are noted layering within the dependent portion of the gallbladder. No signs of gallbladder wall inflammation or bile duct dilatation. Pancreas: Unremarkable. No pancreatic ductal dilatation or surrounding inflammatory changes. Spleen: Normal in size without focal abnormality. Adrenals/Urinary Tract: Bilateral low-attenuation adrenal nodules are unchanged from 09/20/2019 compatible with benign adenomas. Bilateral renal cortical scarring noted. No nephrolithiasis, mass, or hydronephrosis. No hydroureter identified. Circumferential wall thickening involving the partially decompressed urinary bladder noted. Stomach/Bowel: Stomach appears nondistended. No small bowel wall thickening, inflammation, or distension. The appendix is visualized and appears within normal limits, image 63/2. There is diffuse colonic distension from the proximal cecum up to the mid rectum. This has maximum diameter 9.8 cm, image 36/3. This is compared with 9.5 cm on 10/16/2021. There is an abrupt transition within the mid rectum, image 92/3. Here, there is marked decreased luminal narrowing with suspected underlying colonic circumferential apple-core lesion measuring approximately 2.6 by 2.6 cm, image 88/4 and image 79/2. This is approximately 6 cm from the anal verge, image 87/4. Some enteric contrast material is noted distal to this short segment. Vascular/Lymphatic: Aortic  atherosclerosis. No aneurysm. No abdominopelvic adenopathy. Reproductive: Prostate is unremarkable. Other: No free fluid or fluid collections identified. No pneumoperitoneum identified. Musculoskeletal: No acute or suspicious osseous findings. The bones appear diffusely osteopenic. Previous left hip arthroplasty. IMPRESSION: 1. There is diffuse colonic distension from the proximal cecum up to the mid rectum. There is an abrupt transition within the mid rectum with suspected underlying circumferential apple-core lesion measuring approximately 2.6 cm from the anal verge. 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 Further investigation with colonoscopy is recommended. 2. No evidence for bowel perforation or abscess. 3. Gallstones. 4. Trace right pleural effusion with overlying atelectasis. 5. Aortic Atherosclerosis (ICD10-I70.0). Electronically Signed   By: Kerby Moors M.D.   On: 10/22/2021 14:48   DG Abd 1 View  Result Date: 10/21/2021 CLINICAL DATA:  Abdominal distention EXAM: ABDOMEN - 1 VIEW COMPARISON:  Previous studies including the examination done on 10/19/2021 FINDINGS: There is marked gaseous distention of colon measuring up to 10 cm in diameter. There is oral contrast administered for previous CT in the the right colon and rectum. There is no abnormal dilation of small-bowel loops. There  is no gastric distention. No abnormal masses or calcifications are seen. Kidneys are partly obscured by bowel contents. Subsegmental atelectasis is seen in the right lower lung fields. IMPRESSION: Gaseous distention of colon has not changed significantly suggesting ileus. Linear densities in the right lower lung fields suggest subsegmental atelectasis. Electronically Signed   By: Elmer Picker M.D.   On: 10/21/2021 13:31   CT PELVIS WO CONTRAST  Result Date: 10/22/2021 CLINICAL DATA:  Question of rectal lesion on CT scan earlier today. EXAM: CT PELVIS WITHOUT CONTRAST TECHNIQUE: Multidetector CT  imaging of the pelvis was performed following the standard protocol without intravenous contrast. RADIATION DOSE REDUCTION: This exam was performed according to the departmental dose-optimization program which includes automated exposure control, adjustment of the mA and/or kV according to patient size and/or use of iterative reconstruction technique. COMPARISON:  CT scan from earlier today. FINDINGS: Urinary Tract:  Unremarkable. Bowel: As noted on prior studies, there is diffuse gaseous dilatation of colon. The area of focal wall thickening and luminal narrowing seen in the rectum earlier today is less prominent on the supine scan. Patient was also scanned in the prone position to allow this portion of the colon to become non dependent and filled with gas. Prone imaging is demonstrated on series 12 and the rectum is diffusely well distended without wall thickening or obstructing lesion. Sagittal reconstructions (see image 97 of series 14) show good distension of the sigmoid colon and rectum to the level of the anal verge. Vascular/Lymphatic: Atherosclerotic calcification noted in the distal aorta and common iliac arteries. No pelvic sidewall lymphadenopathy. Reproductive: The prostate gland and seminal vesicles are unremarkable. Other:  No intraperitoneal free fluid. Musculoskeletal: Status post left total hip replacement. IMPRESSION: 1. The area of focal wall thickening and luminal narrowing seen in the rectum earlier today is less prominent on the repeat supine imaging and resolves completely with prone imaging. Sigmoid colon and rectum is well distended on the prone imaging. There is no rectal soft tissue mass or evidence of rectal stricture. 2. Diffuse gaseous dilatation of the colon. 3. Status post left total hip replacement. 4. Aortic Atherosclerosis (ICD10-I70.0). Electronically Signed   By: Misty Stanley M.D.   On: 10/22/2021 17:46   Medications: I have reviewed the patient's current  medications. Scheduled Meds:  (feeding supplement) PROSource Plus  30 mL Oral BID BM   ALPRAZolam  0.5 mg Oral BID   aspirin  81 mg Oral Daily   B-complex with vitamin C  1 tablet Oral Daily   calcium-vitamin D  1 tablet Oral BID   chewing gum (ORBIT) sugar free  1 Stick Oral TID   feeding supplement  1 Container Oral TID BM   gabapentin  800 mg Oral TID   heparin  5,000 Units Subcutaneous Q8H   lisinopril  5 mg Oral Daily   methocarbamol  500 mg Oral Q8H   metoprolol succinate  25 mg Oral Daily   multivitamin with minerals  1 tablet Oral Daily   nicotine  21 mg Transdermal Daily   pantoprazole (PROTONIX) IV  40 mg Intravenous Q24H   sertraline  50 mg Oral Daily   tamsulosin  0.4 mg Oral QPM   tiotropium  18 mcg Inhalation Daily   Continuous Infusions:  ampicillin (OMNIPEN) IV 1 g (10/23/21 0833)   dextrose 5 % and 0.45% NaCl 75 mL/hr at 10/23/21 0605   levETIRAcetam 1,000 mg (10/23/21 0108)   PRN Meds:.acetaminophen, albuterol, alum & mag hydroxide-simeth, dextromethorphan-guaiFENesin, diclofenac Sodium, hydrALAZINE,  LORazepam, LORazepam, menthol-cetylpyridinium, metoprolol tartrate, nitroGLYCERIN, nystatin, ondansetron (ZOFRAN) IV, traZODone   Assessment: Principal Problem:   Abdominal pain Active Problems:   Seizures (HCC)   HTN (hypertension)   CAD (coronary artery disease)   COPD (chronic obstructive pulmonary disease) (HCC)   Depression with anxiety   Ogilvie's syndrome   UTI (urinary tract infection)   HLD (hyperlipidemia)   Stroke (HCC)   Sepsis (HCC)   Chronic kidney disease, stage 3a (HCC)   Tobacco abuse   Abdominal distention   Calvin [name] 71 y.o. male following for Ogilvie syndrome.  The patient had a CT scan yesterday for abdominal distention and was having some concerns about a mass in the rectum which was probably leading to obstruction.  This was subsequently rescanned with a CT of the pelvis in different positions and no mass was seen.  Rectal tube  was placed yesterday to help with decompression   Plan: Monitor electrolytes and replace if low, avoid narcotics, mobilization versus changing position. Change position of patient and have rectal tube flushed periodically.  Dr. Haig Prophet will be following the patient from tomorrow.   LOS: 7 days   Calvin Bellows, MD 10/23/2021, 9:29 AM

## 2021-10-23 NOTE — Plan of Care (Signed)
  Problem: Clinical Measurements: Goal: Diagnostic test results will improve Outcome: Progressing   

## 2021-10-23 NOTE — Progress Notes (Signed)
10/23/2021  Subjective: No acute events.  Patient denies any abdominal pain but feels that his abdomen still distended.  Rectal tube was placed yesterday with some output recorded but unclear if this is helping significantly.  Has otherwise been tolerating clear liquids  Vital signs: Temp:  [97.3 F (36.3 C)-98.9 F (37.2 C)] 98.9 F (37.2 C) (02/19 1615) Pulse Rate:  [61-79] 72 (02/19 1615) Resp:  [16-20] 18 (02/19 1615) BP: (104-171)/(59-91) 145/90 (02/19 1615) SpO2:  [98 %-100 %] 99 % (02/19 1615)   Intake/Output: 02/18 0701 - 02/19 0700 In: -  Out: 1900 [Urine:1900] Last BM Date : 10/23/21 (rectal tube in place)  Physical Exam: Constitutional: No acute distress Abdomen: Soft, distended with tympany, nontender to palpation.  Labs:  Recent Labs    10/22/21 0300 10/23/21 0451  WBC 3.4* 3.3*  HGB 11.3* 12.3*  HCT 34.8* 38.6*  PLT 165 181   Recent Labs    10/22/21 0530 10/23/21 0451  NA 142 141  K 4.4 3.7  CL 112* 112*  CO2 23 21*  GLUCOSE 99 105*  BUN 9 8  CREATININE 1.10 0.92  CALCIUM 8.6* 8.7*   No results for input(s): LABPROT, INR in the last 72 hours.  Imaging: CT PELVIS WO CONTRAST  Result Date: 10/22/2021 CLINICAL DATA:  Question of rectal lesion on CT scan earlier today. EXAM: CT PELVIS WITHOUT CONTRAST TECHNIQUE: Multidetector CT imaging of the pelvis was performed following the standard protocol without intravenous contrast. RADIATION DOSE REDUCTION: This exam was performed according to the departmental dose-optimization program which includes automated exposure control, adjustment of the mA and/or kV according to patient size and/or use of iterative reconstruction technique. COMPARISON:  CT scan from earlier today. FINDINGS: Urinary Tract:  Unremarkable. Bowel: As noted on prior studies, there is diffuse gaseous dilatation of colon. The area of focal wall thickening and luminal narrowing seen in the rectum earlier today is less prominent on the supine  scan. Patient was also scanned in the prone position to allow this portion of the colon to become non dependent and filled with gas. Prone imaging is demonstrated on series 12 and the rectum is diffusely well distended without wall thickening or obstructing lesion. Sagittal reconstructions (see image 97 of series 14) show good distension of the sigmoid colon and rectum to the level of the anal verge. Vascular/Lymphatic: Atherosclerotic calcification noted in the distal aorta and common iliac arteries. No pelvic sidewall lymphadenopathy. Reproductive: The prostate gland and seminal vesicles are unremarkable. Other:  No intraperitoneal free fluid. Musculoskeletal: Status post left total hip replacement. IMPRESSION: 1. The area of focal wall thickening and luminal narrowing seen in the rectum earlier today is less prominent on the repeat supine imaging and resolves completely with prone imaging. Sigmoid colon and rectum is well distended on the prone imaging. There is no rectal soft tissue mass or evidence of rectal stricture. 2. Diffuse gaseous dilatation of the colon. 3. Status post left total hip replacement. 4. Aortic Atherosclerosis (ICD10-I70.0). Electronically Signed   By: Misty Stanley M.D.   On: 10/22/2021 17:46    Assessment/Plan: This is a 71 y.o. male with Ogilvie syndrome.  - CT scan repeated yesterday did not show any evidence of stricture or narrowing or apple core lesion in the distal colon to early rectum.  At this point there is no evidence of obstruction which supports more the diagnosis of Ogilvie's.  At this point there is no concern for any perforation, ischemia, or severe distention of the colon  and so for now no acute surgical needs.  Would recommend conservative management and GI is currently following. - Surgical team will sign off again for now but will update Dr. Lysle Pearl of the weekend events gather any further questions for him in the future.   I spent 25 minutes dedicated to the  care of this patient on the date of this encounter to include pre-visit review of records, face-to-face time with the patient discussing diagnosis and management, and any post-visit coordination of care.  Melvyn Neth, [facility]

## 2021-10-23 NOTE — Progress Notes (Signed)
PROGRESS NOTE    Calvin [name]  MBT:[id number] DOB: March 27, 1951 DOA: 10/16/2021 PCP: Pcp, No   Brief Narrative:  71 y.o. male with medical history significant of Ogilvie's syndrome, ileus, hypertension, hyperlipidemia, COPD, stroke with mild left-sided weakness, GERD, gout, depression with anxiety, seizure, CAD, MI,  alcohol abuse, CKD stage IIIa, dCHF, colon cancer, thyroid cancer, who presents with nausea vomiting, abdominal pain.    Patient is a poor historian, history is limited. Pt states that has been having abdominal pain for 2 days, associated with nausea, nonbilious nonbloody vomiting several times.  He also has abdominal distention.  Per his daughter (I called her daughter by phone), she noticed patient's abdominal distention several days ago.  Patient states that he has diarrhea, but per nurse observation, patient's stools are formed in the ED.  Patient does not have chest pain, cough, shortness of breath.  Patient has fever 100.4 in ED. patient continues to have ileus.  GI and general surgery following.     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  Currently patient is unclear but due to increasing abdominal distention on my exam, repeat CT 2/18 doesn't show rectal mass but still significant intestinal distention extending into rectum. Rectal Tube in place.  -GI and general surgery following. If no return of Bowel func by tomorrow he will need TPN.    History of Ogilvie syndrome Laxatives on hold   Possible UTI, felt unlikely Sepsis ruled out -Ucx grew Enteroccocus, On Ampicillin.    CAD (coronary artery disease), no chest pain. -Continue aspirin and statin   COPD (chronic obstructive pulmonary disease) ([place]): Stable -Bronchodilators as needed   Depression with anxiety -Continue home medications when able to tolerate. Home PO meds - Zoloft and Xanax.    HLD  (hyperlipidemia) -Continue statin   HTN (hypertension) -Currently home medications are on hold.  IV hydralazine and Lopressor as needed   Seizure -Seizure precautions.  Currently on IV Keppra   History of Stroke ([place]) -ASA and zocor   Chronic kidney disease, stage 3a ([place]): stable -f/u by BMP   Tobacco abuse: -Nicotine patch    DVT prophylaxis: SQ Heparin Code Status: DNR Family Communication: [name] updated today  Status is: Inpatient Remains inpatient appropriate because: Maintain hospital until return of bowel function. GI and gen Sx following.   Subjective: Still has abd distention. There is some stool in his rectal tube. No fevers and chills.   Examination:  Constitutional: Not in acute distress Respiratory: Clear to auscultation bilaterally Cardiovascular: Normal sinus rhythm, no rubs Abdomen: Distended abd, + BS Musculoskeletal: No edema noted Skin: No rashes seen Neurologic: CN 2-12 grossly intact.  And nonfocal Psychiatric: Normal judgment and insight. Alert and oriented x 3. Normal mood.   Rectal tube in place.    Objective: Vitals:   10/22/21 1936 10/22/21 2351 10/23/21 0517 10/23/21 0749  BP: (!) 171/91 (!) 104/59 122/74 128/83  Pulse: 68 79 70 63  Resp: 20 16 16 18   Temp: (!) 97.5 F (36.4 C) (!) 97.5 F (36.4 C) (!) 97.5 F (36.4 C) 98.1 F (36.7 C)  TempSrc: Oral Oral Oral   SpO2: 98% 98% 99% 100%  Weight:        Intake/Output Summary (Last 24 hours) at 10/23/2021 1210 Last data filed at 10/23/2021 0536 Gross per 24 hour  Intake --  Output 1400 ml  Net -1400 ml   Filed Weights   10/19/21  1194 10/20/21 0440 10/22/21 0500  Weight: 92 kg 94 kg 98.5 kg     Data Reviewed:   CBC: Recent Labs  Lab 10/17/21 0429 10/20/21 0553 10/21/21 0600 10/22/21 0300 10/23/21 0451  WBC 6.4 4.4 3.9* 3.4* 3.3*  HGB 12.9* 12.5* 12.0* 11.3* 12.3*  HCT 40.9 38.8* 36.4* 34.8* 38.6*  MCV 97.4 93.7 93.1 93.3 93.5  PLT 208 180 155 165 174    Basic Metabolic Panel: Recent Labs  Lab 10/17/21 0429 10/20/21 0553 10/21/21 0600 10/22/21 0530 10/23/21 0451  NA 144 142 142 142 141  K 4.0 3.6 3.6 4.4 3.7  CL 103 112* 115* 112* 112*  CO2 28 21* 21* 23 21*  GLUCOSE 89 98 99 99 105*  BUN 17 15 12 9 8   CREATININE 1.18 1.01 0.99 1.10 0.92  CALCIUM 8.8* 8.2* 8.4* 8.6* 8.7*  MG  --  2.1 1.9 1.9 1.7   GFR: Estimated Creatinine Clearance: 87.6 mL/min (by C-G formula based on SCr of 0.92 mg/dL). Liver Function Tests: No results for input(s): AST, ALT, ALKPHOS, BILITOT, PROT, ALBUMIN in the last 168 hours.  No results for input(s): LIPASE, AMYLASE in the last 168 hours.  No results for input(s): AMMONIA in the last 168 hours. Coagulation Profile: No results for input(s): INR, PROTIME in the last 168 hours.  Cardiac Enzymes: No results for input(s): CKTOTAL, CKMB, CKMBINDEX, TROPONINI in the last 168 hours. BNP (last 3 results) No results for input(s): PROBNP in the last 8760 hours. HbA1C: No results for input(s): HGBA1C in the last 72 hours. CBG: Recent Labs  Lab 10/22/21 1144 10/22/21 1823 10/22/21 2349 10/23/21 0515 10/23/21 1148  GLUCAP 117* 79 140* 93 91   Lipid Profile: No results for input(s): CHOL, HDL, LDLCALC, TRIG, CHOLHDL, LDLDIRECT in the last 72 hours. Thyroid Function Tests: No results for input(s): TSH, T4TOTAL, FREET4, T3FREE, THYROIDAB in the last 72 hours. Anemia Panel: No results for input(s): VITAMINB12, FOLATE, FERRITIN, TIBC, IRON, RETICCTPCT in the last 72 hours. Sepsis Labs: No results for input(s): PROCALCITON, LATICACIDVEN in the last 168 hours.   Recent Results (from the past 240 hour(s))  Resp Panel by RT-PCR (Flu A&B, Covid) Nasopharyngeal Swab     Status: None   Collection Time: 10/16/21  6:43 AM   Specimen: Nasopharyngeal Swab; Nasopharyngeal(NP) swabs in vial transport medium  Result Value Ref Range Status   SARS Coronavirus 2 by RT PCR NEGATIVE NEGATIVE Final    Comment:  (NOTE) SARS-CoV-2 target nucleic acids are NOT DETECTED.  The SARS-CoV-2 RNA is generally detectable in upper respiratory specimens during the acute phase of infection. The lowest concentration of SARS-CoV-2 viral copies this assay can detect is 138 copies/mL. A negative result does not preclude SARS-Cov-2 infection and should not be used as the sole basis for treatment or other patient management decisions. A negative result may occur with  improper specimen collection/handling, submission of specimen other than nasopharyngeal swab, presence of viral mutation(s) within the areas targeted by this assay, and inadequate number of viral copies(<138 copies/mL). A negative result must be combined with clinical observations, patient history, and epidemiological information. The expected result is Negative.  Fact Sheet for Patients:  EntrepreneurPulse.com.au  Fact Sheet for Healthcare Providers:  IncredibleEmployment.be  This test is no t yet approved or cleared by the [place] FDA and  has been authorized for detection and/or diagnosis of SARS-CoV-2 by FDA under an Emergency Use Authorization (EUA). This EUA will remain  in effect (meaning this test can be  used) for the duration of the COVID-19 declaration under Section 564(b)(1) of the Act, 21 U.S.C.section 360bbb-3(b)(1), unless the authorization is terminated  or revoked sooner.       Influenza A by PCR NEGATIVE NEGATIVE Final   Influenza B by PCR NEGATIVE NEGATIVE Final    Comment: (NOTE) The Xpert Xpress SARS-CoV-2/FLU/RSV plus assay is intended as an aid in the diagnosis of influenza from Nasopharyngeal swab specimens and should not be used as a sole basis for treatment. Nasal washings and aspirates are unacceptable for Xpert Xpress SARS-CoV-2/FLU/RSV testing.  Fact Sheet for Patients: EntrepreneurPulse.com.au  Fact Sheet for Healthcare  Providers: IncredibleEmployment.be  This test is not yet approved or cleared by the [place] FDA and has been authorized for detection and/or diagnosis of SARS-CoV-2 by FDA under an Emergency Use Authorization (EUA). This EUA will remain in effect (meaning this test can be used) for the duration of the COVID-19 declaration under Section 564(b)(1) of the Act, 21 U.S.C. section 360bbb-3(b)(1), unless the authorization is terminated or revoked.  Performed at [facility], [address] [place], [place] [address]   Blood culture (routine single)     Status: None   Collection Time: 10/16/21  7:00 AM   Specimen: BLOOD  Result Value Ref Range Status   Specimen Description BLOOD ARM  Final   Special Requests   Final    BOTTLES DRAWN AEROBIC AND ANAEROBIC Blood Culture adequate volume   Culture   Final    NO GROWTH 5 DAYS Performed at [facility], [address] [place], [place] [address]    Report Status 10/21/2021 FINAL  Final  Urine Culture     Status: Abnormal   Collection Time: 10/16/21 10:24 AM   Specimen: In/Out Cath Urine  Result Value Ref Range Status   Specimen Description   Final    IN/OUT CATH URINE Performed at [facility], [address], [place], [place] [address]    Special Requests   Final    NONE Performed at [facility], [place], [place] [address]    Culture 60,000 COLONIES/mL ENTEROCOCCUS FAECALIS (A)  Final   Report Status 10/19/2021 FINAL  Final   Organism ID, Bacteria ENTEROCOCCUS FAECALIS (A)  Final      Susceptibility   Enterococcus faecalis - MIC*    AMPICILLIN <=2 SENSITIVE Sensitive     NITROFURANTOIN <=16 SENSITIVE Sensitive     VANCOMYCIN 1 SENSITIVE Sensitive     * 60,000 COLONIES/mL ENTEROCOCCUS FAECALIS  C Difficile Quick Screen w PCR reflex     Status: None   Collection Time: 10/16/21  1:39 PM   Specimen: STOOL  Result Value Ref Range Status   C Diff antigen  NEGATIVE NEGATIVE Final   C Diff toxin NEGATIVE NEGATIVE Final   C Diff interpretation No C. difficile detected.  Final    Comment: Performed at [facility], [address] [place], [place] [address]  Gastrointestinal Panel by PCR , Stool     Status: None   Collection Time: 10/16/21  1:39 PM   Specimen: STOOL  Result Value Ref Range Status   Campylobacter species NOT DETECTED NOT DETECTED Final   Plesimonas shigelloides NOT DETECTED NOT DETECTED Final   Salmonella species NOT DETECTED NOT DETECTED Final   Yersinia enterocolitica NOT DETECTED NOT DETECTED Final   Vibrio species NOT DETECTED NOT DETECTED Final   Vibrio cholerae NOT DETECTED NOT DETECTED Final   Enteroaggregative E coli (EAEC) NOT DETECTED  NOT DETECTED Final   Enteropathogenic E coli (EPEC) NOT DETECTED NOT DETECTED Final   Enterotoxigenic E coli (ETEC) NOT DETECTED NOT DETECTED Final   Shiga like toxin producing E coli (STEC) NOT DETECTED NOT DETECTED Final   Shigella/Enteroinvasive E coli (EIEC) NOT DETECTED NOT DETECTED Final   Cryptosporidium NOT DETECTED NOT DETECTED Final   Cyclospora cayetanensis NOT DETECTED NOT DETECTED Final   Entamoeba histolytica NOT DETECTED NOT DETECTED Final   Giardia lamblia NOT DETECTED NOT DETECTED Final   Adenovirus F40/41 NOT DETECTED NOT DETECTED Final   Astrovirus NOT DETECTED NOT DETECTED Final   Norovirus GI/GII NOT DETECTED NOT DETECTED Final   Rotavirus A NOT DETECTED NOT DETECTED Final   Sapovirus (I, II, IV, and V) NOT DETECTED NOT DETECTED Final    Comment: Performed at [facility], [address] [place], [place] [address]  MRSA Next Gen by PCR, Nasal     Status: None   Collection Time: 10/17/21  4:15 AM   Specimen: Nasal Mucosa; Nasal Swab  Result Value Ref Range Status   MRSA by PCR Next Gen NOT DETECTED NOT DETECTED Final    Comment: (NOTE) The GeneXpert MRSA Assay (FDA approved for NASAL specimens only), is one component of a comprehensive  MRSA colonization surveillance program. It is not intended to diagnose MRSA infection nor to guide or monitor treatment for MRSA infections. Test performance is not FDA approved in patients less than 9 years old. Performed at [facility], [address], [place], [place] [address]          Radiology Studies: CT ABDOMEN PELVIS WO CONTRAST  Result Date: 10/22/2021 CLINICAL DATA:  Evaluate for bowel perforation or peritonitis. EXAM: CT ABDOMEN AND PELVIS WITHOUT CONTRAST TECHNIQUE: Multidetector CT imaging of the abdomen and pelvis was performed following the standard protocol without IV contrast. RADIATION DOSE REDUCTION: This exam was performed according to the departmental dose-optimization program which includes automated exposure control, adjustment of the mA and/or kV according to patient size and/or use of iterative reconstruction technique. COMPARISON:  10/16/2021 FINDINGS: Lower chest: Trace right pleural effusion with overlying atelectasis. Mild subsegmental atelectasis identified in the left base. Hepatobiliary: There is no suspicious liver abnormality. Several calcified granulomas noted. Small gallstones are noted layering within the dependent portion of the gallbladder. No signs of gallbladder wall inflammation or bile duct dilatation. Pancreas: Unremarkable. No pancreatic ductal dilatation or surrounding inflammatory changes. Spleen: Normal in size without focal abnormality. Adrenals/Urinary Tract: Bilateral low-attenuation adrenal nodules are unchanged from 09/20/2019 compatible with benign adenomas. Bilateral renal cortical scarring noted. No nephrolithiasis, mass, or hydronephrosis. No hydroureter identified. Circumferential wall thickening involving the partially decompressed urinary bladder noted. Stomach/Bowel: Stomach appears nondistended. No small bowel wall thickening, inflammation, or distension. The appendix is visualized and appears within normal limits, image 63/2.  There is diffuse colonic distension from the proximal cecum up to the mid rectum. This has maximum diameter 9.8 cm, image 36/3. This is compared with 9.5 cm on 10/16/2021. There is an abrupt transition within the mid rectum, image 92/3. Here, there is marked decreased luminal narrowing with suspected underlying colonic circumferential apple-core lesion measuring approximately 2.6 by 2.6 cm, image 88/4 and image 79/2. This is approximately 6 cm from the anal verge, image 87/4. Some enteric contrast material is noted distal to this short segment. Vascular/Lymphatic: Aortic atherosclerosis. No aneurysm. No abdominopelvic adenopathy. Reproductive: Prostate is unremarkable. Other: No free fluid or fluid collections identified. No pneumoperitoneum identified. Musculoskeletal: No acute or suspicious osseous  findings. The bones appear diffusely osteopenic. Previous left hip arthroplasty. IMPRESSION: 1. There is diffuse colonic distension from the proximal cecum up to the mid rectum. There is an abrupt transition within the mid rectum with suspected underlying circumferential apple-core lesion measuring approximately 2.6 cm from the anal verge. 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 Further investigation with colonoscopy is recommended. 2. No evidence for bowel perforation or abscess. 3. Gallstones. 4. Trace right pleural effusion with overlying atelectasis. 5. Aortic Atherosclerosis (ICD10-I70.0). Electronically Signed   By: Kerby Moors M.D.   On: 10/22/2021 14:48   DG Abd 1 View  Result Date: 10/21/2021 CLINICAL DATA:  Abdominal distention EXAM: ABDOMEN - 1 VIEW COMPARISON:  Previous studies including the examination done on 10/19/2021 FINDINGS: There is marked gaseous distention of colon measuring up to 10 cm in diameter. There is oral contrast administered for previous CT in the the right colon and rectum. There is no abnormal dilation of small-bowel loops. There is no gastric distention. No  abnormal masses or calcifications are seen. Kidneys are partly obscured by bowel contents. Subsegmental atelectasis is seen in the right lower lung fields. IMPRESSION: Gaseous distention of colon has not changed significantly suggesting ileus. Linear densities in the right lower lung fields suggest subsegmental atelectasis. Electronically Signed   By: Elmer Picker M.D.   On: 10/21/2021 13:31   CT PELVIS WO CONTRAST  Result Date: 10/22/2021 CLINICAL DATA:  Question of rectal lesion on CT scan earlier today. EXAM: CT PELVIS WITHOUT CONTRAST TECHNIQUE: Multidetector CT imaging of the pelvis was performed following the standard protocol without intravenous contrast. RADIATION DOSE REDUCTION: This exam was performed according to the departmental dose-optimization program which includes automated exposure control, adjustment of the mA and/or kV according to patient size and/or use of iterative reconstruction technique. COMPARISON:  CT scan from earlier today. FINDINGS: Urinary Tract:  Unremarkable. Bowel: As noted on prior studies, there is diffuse gaseous dilatation of colon. The area of focal wall thickening and luminal narrowing seen in the rectum earlier today is less prominent on the supine scan. Patient was also scanned in the prone position to allow this portion of the colon to become non dependent and filled with gas. Prone imaging is demonstrated on series 12 and the rectum is diffusely well distended without wall thickening or obstructing lesion. Sagittal reconstructions (see image 97 of series 14) show good distension of the sigmoid colon and rectum to the level of the anal verge. Vascular/Lymphatic: Atherosclerotic calcification noted in the distal aorta and common iliac arteries. No pelvic sidewall lymphadenopathy. Reproductive: The prostate gland and seminal vesicles are unremarkable. Other:  No intraperitoneal free fluid. Musculoskeletal: Status post left total hip replacement. IMPRESSION: 1. The  area of focal wall thickening and luminal narrowing seen in the rectum earlier today is less prominent on the repeat supine imaging and resolves completely with prone imaging. Sigmoid colon and rectum is well distended on the prone imaging. There is no rectal soft tissue mass or evidence of rectal stricture. 2. Diffuse gaseous dilatation of the colon. 3. Status post left total hip replacement. 4. Aortic Atherosclerosis (ICD10-I70.0). Electronically Signed   By: Misty Stanley M.D.   On: 10/22/2021 17:46        Scheduled Meds:  (feeding supplement) PROSource Plus  30 mL Oral BID BM   ALPRAZolam  0.5 mg Oral BID   aspirin  81 mg Oral Daily   B-complex with vitamin C  1 tablet Oral Daily   calcium-vitamin D  1 tablet Oral BID   chewing gum (ORBIT) sugar free  1 Stick Oral TID   feeding supplement  1 Container Oral TID BM   gabapentin  800 mg Oral TID   heparin  5,000 Units Subcutaneous Q8H   lisinopril  5 mg Oral Daily   methocarbamol  500 mg Oral Q8H   metoprolol succinate  25 mg Oral Daily   multivitamin with minerals  1 tablet Oral Daily   nicotine  21 mg Transdermal Daily   pantoprazole (PROTONIX) IV  40 mg Intravenous Q24H   sertraline  50 mg Oral Daily   tamsulosin  0.4 mg Oral QPM   tiotropium  18 mcg Inhalation Daily   Continuous Infusions:  ampicillin (OMNIPEN) IV 1 g (10/23/21 0833)   dextrose 5 % and 0.45% NaCl Stopped (10/23/21 1156)   levETIRAcetam 1,000 mg (10/23/21 1206)     LOS: 7 days   Time spent= 35 mins    Wilman Tucker Arsenio Loader, MD [facility]  If 7PM-7AM, please contact night-coverage  10/23/2021, 12:10 PM

## 2021-10-23 NOTE — Progress Notes (Signed)
° °      CROSS COVER NOTE  NAME: Calvin [name] MRN: [id number] DOB : 07-04-1951   Secure chat received from Delsa Sale RN  Pt's daughter said she wants the DNR removed from the pt's chart. He wants to be resuscitated. I also spoke with the pt and he said he is a [place]. He wants [place] to be a full code, but does not want any blood products. Thanks   I called and spoke with daughter to discuss code status. [name], [phone], states she was unaware that patient was a DNR and believes it originated from an admission where patient was intubated and sedated and another family member made him DNR. She says she spoke with her dad tonight when she found out he was DNR and he indicated to her that he wants to be resuscitated in the event of a cardiac or respiratory arrest. Reviewed with daughter that she would like patient to be shocked, intubated, and receive ACLS protocol in the event of a cardiac or respiratory arrest.    I spoke with patient at bedside and Calvin [name] articulated desire to be full code. Reviewed with patient his desire to be shocked, intubated, and receive ACLS protocol in the event of a cardiac or respiratory arrest. Patient chart updated to reflect patient and family's desire and request to be full code.   Neomia Glass MHA, MSN, FNP-BC Nurse Practitioner [facility] Pager [phone]

## 2021-10-24 DIAGNOSIS — R109 Unspecified abdominal pain: Secondary | ICD-10-CM | POA: Diagnosis not present

## 2021-10-24 DIAGNOSIS — I2510 Atherosclerotic heart disease of native coronary artery without angina pectoris: Secondary | ICD-10-CM | POA: Diagnosis not present

## 2021-10-24 DIAGNOSIS — N1831 Chronic kidney disease, stage 3a: Secondary | ICD-10-CM | POA: Diagnosis not present

## 2021-10-24 DIAGNOSIS — R140 Abdominal distension (gaseous): Secondary | ICD-10-CM | POA: Diagnosis not present

## 2021-10-24 LAB — CBC
HCT: 35.3 % — ABNORMAL LOW (ref 39.0–52.0)
Hemoglobin: 11.4 g/dL — ABNORMAL LOW (ref 13.0–17.0)
MCH: 30.2 pg (ref 26.0–34.0)
MCHC: 32.3 g/dL (ref 30.0–36.0)
MCV: 93.6 fL (ref 80.0–100.0)
Platelets: 161 10*3/uL (ref 150–400)
RBC: 3.77 MIL/uL — ABNORMAL LOW (ref 4.22–5.81)
RDW: 12.7 % (ref 11.5–15.5)
WBC: 4.8 10*3/uL (ref 4.0–10.5)
nRBC: 0 % (ref 0.0–0.2)

## 2021-10-24 LAB — BASIC METABOLIC PANEL
Anion gap: 6 (ref 5–15)
BUN: 7 mg/dL — ABNORMAL LOW (ref 8–23)
CO2: 24 mmol/L (ref 22–32)
Calcium: 8.3 mg/dL — ABNORMAL LOW (ref 8.9–10.3)
Chloride: 112 mmol/L — ABNORMAL HIGH (ref 98–111)
Creatinine, Ser: 0.98 mg/dL (ref 0.61–1.24)
GFR, Estimated: >60 mL/min (ref >60)
Glucose, Bld: 79 mg/dL (ref 70–99)
Potassium: 3.4 mmol/L — ABNORMAL LOW (ref 3.5–5.1)
Sodium: 142 mmol/L (ref 135–145)

## 2021-10-24 LAB — GLUCOSE, CAPILLARY
Glucose-Capillary: 124 mg/dL — ABNORMAL HIGH (ref 70–99)
Glucose-Capillary: 74 mg/dL (ref 70–99)
Glucose-Capillary: 96 mg/dL (ref 70–99)
Glucose-Capillary: 80 mg/dL (ref 70–99)
Glucose-Capillary: 128 mg/dL — ABNORMAL HIGH (ref 70–99)

## 2021-10-24 LAB — MAGNESIUM: Magnesium: 1.6 mg/dL — ABNORMAL LOW (ref 1.7–2.4)

## 2021-10-24 MED ORDER — CHLORHEXIDINE GLUCONATE CLOTH 2 % EX PADS
6.0000 | MEDICATED_PAD | Freq: Every day | CUTANEOUS | Status: DC
  Administered 2021-10-25 – 2021-10-28 (×4): 6 via TOPICAL

## 2021-10-24 MED ORDER — POLYETHYLENE GLYCOL 3350 17 G PO PACK
17.0000 g | PACK | Freq: Every day | ORAL | Status: DC
  Administered 2021-10-24 – 2021-10-29 (×5): 17 g via ORAL
  Filled 2021-10-24 (×5): qty 1

## 2021-10-24 MED ORDER — MAGNESIUM SULFATE 4 GM/100ML IV SOLN
4.0000 g | Freq: Once | INTRAVENOUS | Status: AC
  Administered 2021-10-24: 4 g via INTRAVENOUS
  Filled 2021-10-24: qty 100

## 2021-10-24 MED ORDER — POTASSIUM CHLORIDE 10 MEQ/100ML IV SOLN: 10.0000 meq | INTRAVENOUS | Status: DC

## 2021-10-24 MED ORDER — ENSURE ENLIVE PO LIQD
237.0000 mL | Freq: Three times a day (TID) | ORAL | Status: DC
  Administered 2021-10-24 – 2021-11-02 (×19): 237 mL via ORAL

## 2021-10-24 MED ORDER — GLYCOPYRROLATE 0.2 MG/ML IJ SOLN
0.1000 mg | Freq: Once | INTRAMUSCULAR | Status: AC
  Administered 2021-10-24: 0.1 mg via INTRAVENOUS

## 2021-10-24 MED ORDER — POTASSIUM CHLORIDE 10 MEQ/100ML IV SOLN
10.0000 meq | INTRAVENOUS | Status: AC
  Administered 2021-10-24 (×4): 10 meq via INTRAVENOUS
  Filled 2021-10-24 (×4): qty 100

## 2021-10-24 MED ORDER — NEOSTIGMINE METHYLSULFATE 10 MG/10ML IV SOLN
1.0000 mg | Freq: Once | INTRAVENOUS | Status: AC
  Administered 2021-10-24: 1 mg via INTRAVENOUS
  Filled 2021-10-24 (×2): qty 1

## 2021-10-24 MED ORDER — SODIUM CHLORIDE 0.9 % IV SOLN: INTRAVENOUS | Status: DC | PRN

## 2021-10-24 NOTE — Progress Notes (Addendum)
PROGRESS NOTE    Calvin [name]  WUJ:[id number] DOB: October 02, 1950 DOA: 10/16/2021 PCP: Pcp, No   Brief Narrative:  71 y.o. male with medical history significant of Ogilvie's syndrome, ileus, hypertension, hyperlipidemia, COPD, stroke with mild left-sided weakness, GERD, gout, depression with anxiety, seizure, CAD, MI,  alcohol abuse, CKD stage IIIa, dCHF, colon cancer, thyroid cancer, who presents with nausea vomiting, abdominal pain.    Patient is a poor historian, history is limited. Pt states that has been having abdominal pain for 2 days, associated with nausea, nonbilious nonbloody vomiting several times.  He also has abdominal distention.  Per his daughter (I called her daughter by phone), she noticed patient's abdominal distention several days ago.  Patient states that he has diarrhea, but per nurse observation, patient's stools are formed in the ED.  Patient does not have chest pain, cough, shortness of breath.  Patient has fever 100.4 in ED. patient continues to have ileus.  GI and general surgery following.Due to persistent ileus/pseudoobstruction, neostigmine is planned today.     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  Persistent abdominal distention.  Repeat scans does not show any obvious obstruction.  We will transfer patient to higher level of care today for neostigmine administration. -GI and general surgery following. If no return of Bowel func by twe will start TPN History of Ogilvie syndrome Laxatives on hold   Possible UTI, felt unlikely Sepsis ruled out -Ucx grew Enteroccocus, On Ampicillin.  Complete 7-day course   CAD (coronary artery disease), no chest pain. -Continue aspirin and statin   COPD (chronic obstructive pulmonary disease) ([place]): Stable -Bronchodilators as needed   Depression with anxiety -Continue home medications when able to tolerate. Home PO meds -  Zoloft and Xanax.    HLD (hyperlipidemia) -Continue statin   HTN (hypertension) -Currently home medications are on hold.  IV hydralazine and Lopressor as needed   Seizure -Seizure precautions.  Currently on IV Keppra   History of Stroke ([place]) -ASA and zocor   Chronic kidney disease, stage 3a ([place]): stable -f/u by BMP   Tobacco abuse: -Nicotine patch    DVT prophylaxis: SQ Heparin Code Status: DNR Family Communication: [name] updated today  Status is: Inpatient Remains inpatient appropriate because: Maintain hospital until return of bowel function. GI and gen Sx following.   Subjective: Still has abd distention, poor oral intake.   Examination:  Constitutional: Not in acute distress Respiratory: Clear to auscultation bilaterally Cardiovascular: Normal sinus rhythm, no rubs Abdomen: Distended abd; Hyperactive bowel sounds.  Musculoskeletal: No edema noted Skin: No rashes seen Neurologic: CN 2-12 grossly intact.  And nonfocal Psychiatric: Normal judgment and insight. Alert and oriented x 3. Normal mood.   Rectal tube in place.    Objective: Vitals:   10/23/21 1615 10/23/21 1929 10/24/21 0523 10/24/21 0934  BP: (!) 145/90 (!) 149/82 122/72 131/75  Pulse: 72 77 72 70  Resp: 18 16 16 15   Temp: 98.9 F (37.2 C) 98.1 F (36.7 C) 97.9 F (36.6 C) 98.2 F (36.8 C)  TempSrc:  Oral Oral Oral  SpO2: 99% 99% 95% 100%  Weight:        Intake/Output Summary (Last 24 hours) at 10/24/2021 1038 Last data filed at 10/24/2021 1034 Gross per 24 hour  Intake 480 ml  Output 2750 ml  Net -2270 ml   Filed Weights   10/19/21 0459 10/20/21 0440 10/22/21 0500  Weight: 92 kg 94  kg 98.5 kg     Data Reviewed:   CBC: Recent Labs  Lab 10/20/21 0553 10/21/21 0600 10/22/21 0300 10/23/21 0451 10/24/21 0425  WBC 4.4 3.9* 3.4* 3.3* 4.8  HGB 12.5* 12.0* 11.3* 12.3* 11.4*  HCT 38.8* 36.4* 34.8* 38.6* 35.3*  MCV 93.7 93.1 93.3 93.5 93.6  PLT 180 155 165 181 469   Basic  Metabolic Panel: Recent Labs  Lab 10/20/21 0553 10/21/21 0600 10/22/21 0530 10/23/21 0451 10/24/21 0425  NA 142 142 142 141 142  K 3.6 3.6 4.4 3.7 3.4*  CL 112* 115* 112* 112* 112*  CO2 21* 21* 23 21* 24  GLUCOSE 98 99 99 105* 79  BUN 15 12 9 8  7*  CREATININE 1.01 0.99 1.10 0.92 0.98  CALCIUM 8.2* 8.4* 8.6* 8.7* 8.3*  MG 2.1 1.9 1.9 1.7 1.6*   GFR: Estimated Creatinine Clearance: 82.2 mL/min (by C-G formula based on SCr of 0.98 mg/dL). Liver Function Tests: No results for input(s): AST, ALT, ALKPHOS, BILITOT, PROT, ALBUMIN in the last 168 hours.  No results for input(s): LIPASE, AMYLASE in the last 168 hours.  No results for input(s): AMMONIA in the last 168 hours. Coagulation Profile: No results for input(s): INR, PROTIME in the last 168 hours.  Cardiac Enzymes: No results for input(s): CKTOTAL, CKMB, CKMBINDEX, TROPONINI in the last 168 hours. BNP (last 3 results) No results for input(s): PROBNP in the last 8760 hours. HbA1C: No results for input(s): HGBA1C in the last 72 hours. CBG: Recent Labs  Lab 10/23/21 0515 10/23/21 1148 10/23/21 1825 10/24/21 0113 10/24/21 0520  GLUCAP 93 91 80 124* 74   Lipid Profile: No results for input(s): CHOL, HDL, LDLCALC, TRIG, CHOLHDL, LDLDIRECT in the last 72 hours. Thyroid Function Tests: No results for input(s): TSH, T4TOTAL, FREET4, T3FREE, THYROIDAB in the last 72 hours. Anemia Panel: No results for input(s): VITAMINB12, FOLATE, FERRITIN, TIBC, IRON, RETICCTPCT in the last 72 hours. Sepsis Labs: No results for input(s): PROCALCITON, LATICACIDVEN in the last 168 hours.   Recent Results (from the past 240 hour(s))  Resp Panel by RT-PCR (Flu A&B, Covid) Nasopharyngeal Swab     Status: None   Collection Time: 10/16/21  6:43 AM   Specimen: Nasopharyngeal Swab; Nasopharyngeal(NP) swabs in vial transport medium  Result Value Ref Range Status   SARS Coronavirus 2 by RT PCR NEGATIVE NEGATIVE Final    Comment:  (NOTE) SARS-CoV-2 target nucleic acids are NOT DETECTED.  The SARS-CoV-2 RNA is generally detectable in upper respiratory specimens during the acute phase of infection. The lowest concentration of SARS-CoV-2 viral copies this assay can detect is 138 copies/mL. A negative result does not preclude SARS-Cov-2 infection and should not be used as the sole basis for treatment or other patient management decisions. A negative result may occur with  improper specimen collection/handling, submission of specimen other than nasopharyngeal swab, presence of viral mutation(s) within the areas targeted by this assay, and inadequate number of viral copies(<138 copies/mL). A negative result must be combined with clinical observations, patient history, and epidemiological information. The expected result is Negative.  Fact Sheet for Patients:  EntrepreneurPulse.com.au  Fact Sheet for Healthcare Providers:  IncredibleEmployment.be  This test is no t yet approved or cleared by the [place] FDA and  has been authorized for detection and/or diagnosis of SARS-CoV-2 by FDA under an Emergency Use Authorization (EUA). This EUA will remain  in effect (meaning this test can be used) for the duration of the COVID-19 declaration under Section 564(b)(1) of  the Act, 21 U.S.C.section 360bbb-3(b)(1), unless the authorization is terminated  or revoked sooner.       Influenza A by PCR NEGATIVE NEGATIVE Final   Influenza B by PCR NEGATIVE NEGATIVE Final    Comment: (NOTE) The Xpert Xpress SARS-CoV-2/FLU/RSV plus assay is intended as an aid in the diagnosis of influenza from Nasopharyngeal swab specimens and should not be used as a sole basis for treatment. Nasal washings and aspirates are unacceptable for Xpert Xpress SARS-CoV-2/FLU/RSV testing.  Fact Sheet for Patients: EntrepreneurPulse.com.au  Fact Sheet for Healthcare  Providers: IncredibleEmployment.be  This test is not yet approved or cleared by the [place] FDA and has been authorized for detection and/or diagnosis of SARS-CoV-2 by FDA under an Emergency Use Authorization (EUA). This EUA will remain in effect (meaning this test can be used) for the duration of the COVID-19 declaration under Section 564(b)(1) of the Act, 21 U.S.C. section 360bbb-3(b)(1), unless the authorization is terminated or revoked.  Performed at [facility], [address] [place], [place] [address]   Blood culture (routine single)     Status: None   Collection Time: 10/16/21  7:00 AM   Specimen: BLOOD  Result Value Ref Range Status   Specimen Description BLOOD ARM  Final   Special Requests   Final    BOTTLES DRAWN AEROBIC AND ANAEROBIC Blood Culture adequate volume   Culture   Final    NO GROWTH 5 DAYS Performed at [facility], [address] [place], [place] [address]    Report Status 10/21/2021 FINAL  Final  Urine Culture     Status: Abnormal   Collection Time: 10/16/21 10:24 AM   Specimen: In/Out Cath Urine  Result Value Ref Range Status   Specimen Description   Final    IN/OUT CATH URINE Performed at [facility], [address], [place], [place] [address]    Special Requests   Final    NONE Performed at [facility], [place], [place] [address]    Culture 60,000 COLONIES/mL ENTEROCOCCUS FAECALIS (A)  Final   Report Status 10/19/2021 FINAL  Final   Organism ID, Bacteria ENTEROCOCCUS FAECALIS (A)  Final      Susceptibility   Enterococcus faecalis - MIC*    AMPICILLIN <=2 SENSITIVE Sensitive     NITROFURANTOIN <=16 SENSITIVE Sensitive     VANCOMYCIN 1 SENSITIVE Sensitive     * 60,000 COLONIES/mL ENTEROCOCCUS FAECALIS  C Difficile Quick Screen w PCR reflex     Status: None   Collection Time: 10/16/21  1:39 PM   Specimen: STOOL  Result Value Ref Range Status   C Diff antigen  NEGATIVE NEGATIVE Final   C Diff toxin NEGATIVE NEGATIVE Final   C Diff interpretation No C. difficile detected.  Final    Comment: Performed at [facility], [address] [place], [place] [address]  Gastrointestinal Panel by PCR , Stool     Status: None   Collection Time: 10/16/21  1:39 PM   Specimen: STOOL  Result Value Ref Range Status   Campylobacter species NOT DETECTED NOT DETECTED Final   Plesimonas shigelloides NOT DETECTED NOT DETECTED Final   Salmonella species NOT DETECTED NOT DETECTED Final   Yersinia enterocolitica NOT DETECTED NOT DETECTED Final   Vibrio species NOT DETECTED NOT DETECTED Final   Vibrio cholerae NOT DETECTED NOT DETECTED Final   Enteroaggregative E coli (EAEC) NOT DETECTED NOT DETECTED Final   Enteropathogenic E coli (EPEC) NOT DETECTED NOT  DETECTED Final   Enterotoxigenic E coli (ETEC) NOT DETECTED NOT DETECTED Final   Shiga like toxin producing E coli (STEC) NOT DETECTED NOT DETECTED Final   Shigella/Enteroinvasive E coli (EIEC) NOT DETECTED NOT DETECTED Final   Cryptosporidium NOT DETECTED NOT DETECTED Final   Cyclospora cayetanensis NOT DETECTED NOT DETECTED Final   Entamoeba histolytica NOT DETECTED NOT DETECTED Final   Giardia lamblia NOT DETECTED NOT DETECTED Final   Adenovirus F40/41 NOT DETECTED NOT DETECTED Final   Astrovirus NOT DETECTED NOT DETECTED Final   Norovirus GI/GII NOT DETECTED NOT DETECTED Final   Rotavirus A NOT DETECTED NOT DETECTED Final   Sapovirus (I, II, IV, and V) NOT DETECTED NOT DETECTED Final    Comment: Performed at [facility], [address] [place], [place] [address]  MRSA Next Gen by PCR, Nasal     Status: None   Collection Time: 10/17/21  4:15 AM   Specimen: Nasal Mucosa; Nasal Swab  Result Value Ref Range Status   MRSA by PCR Next Gen NOT DETECTED NOT DETECTED Final    Comment: (NOTE) The GeneXpert MRSA Assay (FDA approved for NASAL specimens only), is one component of a comprehensive  MRSA colonization surveillance program. It is not intended to diagnose MRSA infection nor to guide or monitor treatment for MRSA infections. Test performance is not FDA approved in patients less than 47 years old. Performed at [facility], [address], [place], [place] [address]          Radiology Studies: CT ABDOMEN PELVIS WO CONTRAST  Result Date: 10/22/2021 CLINICAL DATA:  Evaluate for bowel perforation or peritonitis. EXAM: CT ABDOMEN AND PELVIS WITHOUT CONTRAST TECHNIQUE: Multidetector CT imaging of the abdomen and pelvis was performed following the standard protocol without IV contrast. RADIATION DOSE REDUCTION: This exam was performed according to the departmental dose-optimization program which includes automated exposure control, adjustment of the mA and/or kV according to patient size and/or use of iterative reconstruction technique. COMPARISON:  10/16/2021 FINDINGS: Lower chest: Trace right pleural effusion with overlying atelectasis. Mild subsegmental atelectasis identified in the left base. Hepatobiliary: There is no suspicious liver abnormality. Several calcified granulomas noted. Small gallstones are noted layering within the dependent portion of the gallbladder. No signs of gallbladder wall inflammation or bile duct dilatation. Pancreas: Unremarkable. No pancreatic ductal dilatation or surrounding inflammatory changes. Spleen: Normal in size without focal abnormality. Adrenals/Urinary Tract: Bilateral low-attenuation adrenal nodules are unchanged from 09/20/2019 compatible with benign adenomas. Bilateral renal cortical scarring noted. No nephrolithiasis, mass, or hydronephrosis. No hydroureter identified. Circumferential wall thickening involving the partially decompressed urinary bladder noted. Stomach/Bowel: Stomach appears nondistended. No small bowel wall thickening, inflammation, or distension. The appendix is visualized and appears within normal limits, image 63/2.  There is diffuse colonic distension from the proximal cecum up to the mid rectum. This has maximum diameter 9.8 cm, image 36/3. This is compared with 9.5 cm on 10/16/2021. There is an abrupt transition within the mid rectum, image 92/3. Here, there is marked decreased luminal narrowing with suspected underlying colonic circumferential apple-core lesion measuring approximately 2.6 by 2.6 cm, image 88/4 and image 79/2. This is approximately 6 cm from the anal verge, image 87/4. Some enteric contrast material is noted distal to this short segment. Vascular/Lymphatic: Aortic atherosclerosis. No aneurysm. No abdominopelvic adenopathy. Reproductive: Prostate is unremarkable. Other: No free fluid or fluid collections identified. No pneumoperitoneum identified. Musculoskeletal: No acute or suspicious osseous findings. The bones appear diffusely osteopenic. Previous left hip arthroplasty. IMPRESSION: 1.  There is diffuse colonic distension from the proximal cecum up to the mid rectum. There is an abrupt transition within the mid rectum with suspected underlying circumferential apple-core lesion measuring approximately 2.6 cm from the anal verge. 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 Further investigation with colonoscopy is recommended. 2. No evidence for bowel perforation or abscess. 3. Gallstones. 4. Trace right pleural effusion with overlying atelectasis. 5. Aortic Atherosclerosis (ICD10-I70.0). Electronically Signed   By: Kerby Moors M.D.   On: 10/22/2021 14:48   CT PELVIS WO CONTRAST  Result Date: 10/22/2021 CLINICAL DATA:  Question of rectal lesion on CT scan earlier today. EXAM: CT PELVIS WITHOUT CONTRAST TECHNIQUE: Multidetector CT imaging of the pelvis was performed following the standard protocol without intravenous contrast. RADIATION DOSE REDUCTION: This exam was performed according to the departmental dose-optimization program which includes automated exposure control, adjustment of the mA  and/or kV according to patient size and/or use of iterative reconstruction technique. COMPARISON:  CT scan from earlier today. FINDINGS: Urinary Tract:  Unremarkable. Bowel: As noted on prior studies, there is diffuse gaseous dilatation of colon. The area of focal wall thickening and luminal narrowing seen in the rectum earlier today is less prominent on the supine scan. Patient was also scanned in the prone position to allow this portion of the colon to become non dependent and filled with gas. Prone imaging is demonstrated on series 12 and the rectum is diffusely well distended without wall thickening or obstructing lesion. Sagittal reconstructions (see image 97 of series 14) show good distension of the sigmoid colon and rectum to the level of the anal verge. Vascular/Lymphatic: Atherosclerotic calcification noted in the distal aorta and common iliac arteries. No pelvic sidewall lymphadenopathy. Reproductive: The prostate gland and seminal vesicles are unremarkable. Other:  No intraperitoneal free fluid. Musculoskeletal: Status post left total hip replacement. IMPRESSION: 1. The area of focal wall thickening and luminal narrowing seen in the rectum earlier today is less prominent on the repeat supine imaging and resolves completely with prone imaging. Sigmoid colon and rectum is well distended on the prone imaging. There is no rectal soft tissue mass or evidence of rectal stricture. 2. Diffuse gaseous dilatation of the colon. 3. Status post left total hip replacement. 4. Aortic Atherosclerosis (ICD10-I70.0). Electronically Signed   By: Misty Stanley M.D.   On: 10/22/2021 17:46        Scheduled Meds:  (feeding supplement) PROSource Plus  30 mL Oral BID BM   ALPRAZolam  0.5 mg Oral BID   aspirin  81 mg Oral Daily   B-complex with vitamin C  1 tablet Oral Daily   calcium-vitamin D  1 tablet Oral BID   chewing gum (ORBIT) sugar free  1 Stick Oral TID   feeding supplement  1 Container Oral TID BM    gabapentin  800 mg Oral TID   heparin  5,000 Units Subcutaneous Q8H   lisinopril  5 mg Oral Daily   methocarbamol  500 mg Oral Q8H   metoprolol succinate  25 mg Oral Daily   multivitamin with minerals  1 tablet Oral Daily   nicotine  21 mg Transdermal Daily   pantoprazole (PROTONIX) IV  40 mg Intravenous Q24H   sertraline  50 mg Oral Daily   tamsulosin  0.4 mg Oral QPM   tiotropium  18 mcg Inhalation Daily   Continuous Infusions:  ampicillin (OMNIPEN) IV 1 g (10/24/21 0914)   levETIRAcetam 1,000 mg (10/24/21 0105)   magnesium sulfate bolus IVPB 4  g (10/24/21 0949)   potassium chloride       LOS: 8 days   Time spent= 35 mins    Onell Mcmath Arsenio Loader, MD [facility]  If 7PM-7AM, please contact night-coverage  10/24/2021, 10:38 AM

## 2021-10-24 NOTE — Progress Notes (Signed)
Nutrition Follow-up  DOCUMENTATION CODES:   Not applicable  INTERVENTION:   Ensure Enlive po TID, each supplement provides 350 kcal and 20 grams of protein.  MVI po daily   Calvin [name] at high refeed risk; recommend monitor potassium, magnesium and phosphorus labs daily until stable  NUTRITION DIAGNOSIS:   Inadequate oral intake related to acute illness as evidenced by other (comment) (Calvin [name] on NPO/clear liquid diet since admission).  GOAL:   Patient will meet greater than or equal to 90% of their needs -not met   MONITOR:   Diet advancement, Labs, Weight trends, Skin, I & O's, PO intake, Supplement acceptance  ASSESSMENT:   71 y.o. male with medical history significant of Ogilvie's syndrome, ileus, hypertension, hyperlipidemia, COPD, MI, stroke with mild left-sided weakness, GERD, gout, depression with anxiety, seizure, DDD, CAD, MI,  alcohol abuse, CKD stage IIIa, dCHF, colon cancer and thyroid cancer who is admitted with colonic ileus secondary to Ogilvie syndrome.  Met with Calvin [name] in room today. Calvin [name] reports that he feels terrible today. Calvin [name] reports continued abdominal pain but denies any nausea or vomiting. Calvin [name] with continued abdominal distension but he reports that this is improving. Calvin [name] on NPO/clear liquid diet since admission and is now without adequate nutrition for > 7 days. Calvin [name] advanced to full liquid diet today. Calvin [name] reports today that he has been drinking broth from a cup and that he has been drinking all of his Boost Breeze and ProSource Plus supplements. Calvin [name]'s main complaint today is that his food was cold. RD discussed with Calvin [name] the importance of adequate nutrition needed to preserve lean muscle. Calvin [name] reports that he is willing to drink chocolate Ensure. RD will add supplements and MVI to help Calvin [name] meet his estimated needs. Calvin [name] is at high refeed risk. Calvin [name] is having BMs today. Will consider TPN if patient is unable to tolerate oral intake. No repeat imaging since 2/18. Per chart, Calvin [name] is weight stable  since admission; however, Calvin [name] has not been weighed since 2/18. RD will request daily weights.   Medications reviewed and include: ProSource, aspirin, B-complex with C, oscal w/ D, heparin, MVI, nicotine, protonix, ampicillin, KCl  Labs reviewed: K 3.4(L), Mg 1.6(L) Cbgs- 96, 74, 124 x 24 hrs  Diet Order:   Diet Order             Diet clear liquid Room service appropriate? Yes; Fluid consistency: Thin  Diet effective now                  EDUCATION NEEDS:   No education needs have been identified at this time  Skin:  Skin Assessment: Reviewed RN Assessment (coccyx- small opening about 0.5 cm deep that appears to be healed)  Last BM:  2/20- type 7  Height:   Ht Readings from Last 1 Encounters:  10/02/21 5' 11.5" (1.816 m)    Weight:   Wt Readings from Last 1 Encounters:  10/22/21 98.5 kg    Ideal Body Weight:  79.5 kg  BMI:  Body mass index is 29.86 kg/m.  Estimated Nutritional Needs:   Kcal:  2100-2400kcal/day  Protein:  105-120g/day  Fluid:  2.0-2.3L/day  Koleen Distance MS, RD, LDN Please refer to [facility] for RD and/or RD on-call/weekend/after hours pager

## 2021-10-24 NOTE — Plan of Care (Signed)
Patient alert and oriented x4 with some intermittent confusion. In NSR, on room air. Patient given medication per orders to help with abdominal distention, medication seems effective with > than 500 output via tube. Patient denies pain, currently resting and watching television.

## 2021-10-24 NOTE — Plan of Care (Signed)

## 2021-10-24 NOTE — Progress Notes (Signed)
OT Cancellation Note  Patient Details Name: Calvin [name] MRN: [id number] DOB: 08-30-1951   Cancelled Treatment:    Reason Eval/Treat Not Completed: Medical issues which prohibited therapy;Other (comment). Chart reviewed. Pt noted to have transitioned to higher level of care (room 225>IC16). MD notified of need for new therapy orders once pt is medically appropriate. Will complete current orders & await new ones.   Ardeth Perfect., MPH, MS, OTR/L ascom [phone] 10/24/21, 2:05 PM

## 2021-10-24 NOTE — Progress Notes (Signed)
GI Inpatient Follow-up Note  Subjective:  Patient seen and doing well. Received neostigmine earlier today with no side effects.  Scheduled Inpatient Medications:   (feeding supplement) PROSource Plus  30 mL Oral BID BM   ALPRAZolam  0.5 mg Oral BID   aspirin  81 mg Oral Daily   B-complex with vitamin C  1 tablet Oral Daily   calcium-vitamin D  1 tablet Oral BID   chewing gum (ORBIT) sugar free  1 Stick Oral TID   [START ON 10/25/2021] Chlorhexidine Gluconate Cloth  6 each Topical Daily   feeding supplement  1 Container Oral TID BM   gabapentin  800 mg Oral TID   heparin  5,000 Units Subcutaneous Q8H   lisinopril  5 mg Oral Daily   methocarbamol  500 mg Oral Q8H   multivitamin with minerals  1 tablet Oral Daily   nicotine  21 mg Transdermal Daily   pantoprazole (PROTONIX) IV  40 mg Intravenous Q24H   sertraline  50 mg Oral Daily   tamsulosin  0.4 mg Oral QPM   tiotropium  18 mcg Inhalation Daily    Continuous Inpatient Infusions:    ampicillin (OMNIPEN) IV 1 g (10/24/21 1455)   levETIRAcetam 1,000 mg (10/24/21 0105)   potassium chloride 10 mEq (10/24/21 1546)    PRN Inpatient Medications:  acetaminophen, albuterol, alum & mag hydroxide-simeth, dextromethorphan-guaiFENesin, diclofenac Sodium, hydrALAZINE, LORazepam, LORazepam, menthol-cetylpyridinium, metoprolol tartrate, nitroGLYCERIN, nystatin, ondansetron (ZOFRAN) IV, traZODone  Review of Systems:  Review of Systems  Constitutional:  Negative for chills and fever.  Respiratory:  Negative for shortness of breath.   Cardiovascular:  Negative for chest pain.  Gastrointestinal:  Negative for abdominal pain, blood in stool, constipation, diarrhea, heartburn, nausea and vomiting.  Musculoskeletal:  Negative for joint pain.  Skin:  Negative for itching and rash.  Neurological:  Negative for dizziness.  Psychiatric/Behavioral:  Negative for substance abuse.   All other systems reviewed and are negative.    Physical  Examination: BP 116/65    Pulse 67    Temp 98.1 F (36.7 C) (Oral)    Resp 19    Wt 98.5 kg    SpO2 98%    BMI 29.86 kg/m  Gen: NAD, alert and oriented x 4 HEENT: PEERLA, EOMI, Neck: supple, no JVD or thyromegaly Chest: No respiratory distress CV: RRR Abd: slightly distended but soft Ext: no edema, well perfused with 2+ pulses Skin: no rash or lesions noted Lymph: no LAD  Data: Lab Results  Component Value Date   WBC 4.8 10/24/2021   HGB 11.4 (L) 10/24/2021   HCT 35.3 (L) 10/24/2021   MCV 93.6 10/24/2021   PLT 161 10/24/2021   Recent Labs  Lab 10/22/21 0300 10/23/21 0451 10/24/21 0425  HGB 11.3* 12.3* 11.4*   Lab Results  Component Value Date   NA 142 10/24/2021   K 3.4 (L) 10/24/2021   CL 112 (H) 10/24/2021   CO2 24 10/24/2021   BUN 7 (L) 10/24/2021   CREATININE 0.98 10/24/2021   Lab Results  Component Value Date   ALT 20 10/16/2021   AST 20 10/16/2021   ALKPHOS 67 10/16/2021   BILITOT 0.6 10/16/2021   No results for input(s): APTT, INR, PTT in the last 168 hours. Assessment/Plan: Mr. Proehl is a 71 y.o. gentleman with multiple medical problems including COPD, chronic abdominal pain, CVA's, and history of colon cancer s/p sigmoidectomy here with ileus/ogilvies due to chronic narcotics. S/P neostigmine with around 500 mL out in 30  minutes  Recommendations:  - will advance diet to full liquids, continue protein supplementation, ideally would avoid TPN now that it seems his bowels are moving - will start back miralax daily - replete electrolytes - PT/OT and out of bed to chair - will get x-ray in the morning - will assess tomorrow to see if repeated dose of neostigmine is needed  Please call with any questions or concerns  Raylene Miyamoto MD, MPH [place]

## 2021-10-24 NOTE — Progress Notes (Signed)
PT Cancellation Note  Patient Details Name: Calvin [name] MRN: [id number] DOB: 12/14/1950   Cancelled Treatment:    Reason Eval/Treat Not Completed: Other (comment) Pt noted to have transitioned to higher level of care (room 225>IC16). Pt without continue on transfer orders. Will complete current orders & await new ones.  Lavone Nian, PT, DPT 10/24/21, 1:10 PM   [name] 10/24/2021, 1:10 PM

## 2021-10-24 NOTE — Care Management Important Message (Signed)
Important Message  Patient Details  Name: Calvin [name] MRN: [id number] Date of Birth: [birth year]   Medicare Important Message Given:  Yes     Dannette Barbara 10/24/2021, 10:32 AM

## 2021-10-24 NOTE — Plan of Care (Signed)
  Problem: Activity: Goal: Risk for activity intolerance will decrease Outcome: Progressing   Problem: Nutrition: Goal: Adequate nutrition will be maintained Outcome: Progressing   Problem: Coping: Goal: Level of anxiety will decrease Outcome: Progressing   Problem: Safety: Goal: Ability to remain free from injury will improve Outcome: Progressing   Problem: Skin Integrity: Goal: Risk for impaired skin integrity will decrease Outcome: Progressing   

## 2021-10-25 ENCOUNTER — Inpatient Hospital Stay: Payer: Self-pay

## 2021-10-25 ENCOUNTER — Inpatient Hospital Stay: Payer: Medicare Other

## 2021-10-25 DIAGNOSIS — J449 Chronic obstructive pulmonary disease, unspecified: Secondary | ICD-10-CM | POA: Diagnosis not present

## 2021-10-25 DIAGNOSIS — I2510 Atherosclerotic heart disease of native coronary artery without angina pectoris: Secondary | ICD-10-CM | POA: Diagnosis not present

## 2021-10-25 DIAGNOSIS — R140 Abdominal distension (gaseous): Secondary | ICD-10-CM | POA: Diagnosis not present

## 2021-10-25 DIAGNOSIS — R109 Unspecified abdominal pain: Secondary | ICD-10-CM | POA: Diagnosis not present

## 2021-10-25 LAB — MAGNESIUM: Magnesium: 2.1 mg/dL (ref 1.7–2.4)

## 2021-10-25 LAB — CBC
HCT: 39 % (ref 39.0–52.0)
Hemoglobin: 12.3 g/dL — ABNORMAL LOW (ref 13.0–17.0)
MCH: 30.1 pg (ref 26.0–34.0)
MCHC: 31.5 g/dL (ref 30.0–36.0)
MCV: 95.4 fL (ref 80.0–100.0)
Platelets: 181 10*3/uL (ref 150–400)
RBC: 4.09 MIL/uL — ABNORMAL LOW (ref 4.22–5.81)
RDW: 13 % (ref 11.5–15.5)
WBC: 5 10*3/uL (ref 4.0–10.5)
nRBC: 0 % (ref 0.0–0.2)

## 2021-10-25 LAB — GLUCOSE, CAPILLARY
Glucose-Capillary: 87 mg/dL (ref 70–99)
Glucose-Capillary: 96 mg/dL (ref 70–99)
Glucose-Capillary: 104 mg/dL — ABNORMAL HIGH (ref 70–99)
Glucose-Capillary: 126 mg/dL — ABNORMAL HIGH (ref 70–99)

## 2021-10-25 LAB — BASIC METABOLIC PANEL
Anion gap: 6 (ref 5–15)
BUN: 7 mg/dL — ABNORMAL LOW (ref 8–23)
CO2: 24 mmol/L (ref 22–32)
Calcium: 8.5 mg/dL — ABNORMAL LOW (ref 8.9–10.3)
Chloride: 110 mmol/L (ref 98–111)
Creatinine, Ser: 1.06 mg/dL (ref 0.61–1.24)
GFR, Estimated: >60 mL/min (ref >60)
Glucose, Bld: 91 mg/dL (ref 70–99)
Potassium: 4 mmol/L (ref 3.5–5.1)
Sodium: 140 mmol/L (ref 135–145)

## 2021-10-25 LAB — PHOSPHORUS: Phosphorus: 3.6 mg/dL (ref 2.5–4.6)

## 2021-10-25 MED ORDER — NYSTATIN 100000 UNIT/GM EX POWD
Freq: Two times a day (BID) | CUTANEOUS | Status: DC
  Filled 2021-10-25: qty 15

## 2021-10-25 MED ORDER — SODIUM CHLORIDE 0.9% FLUSH
10.0000 mL | Freq: Two times a day (BID) | INTRAVENOUS | Status: DC
  Administered 2021-10-25 – 2021-11-02 (×17): 10 mL

## 2021-10-25 MED ORDER — NEOSTIGMINE METHYLSULFATE 10 MG/10ML IV SOLN
1.0000 mg | Freq: Once | INTRAVENOUS | Status: AC
  Administered 2021-10-25: 1 mg via INTRAVENOUS
  Filled 2021-10-25: qty 1

## 2021-10-25 MED ORDER — SODIUM CHLORIDE 0.9% FLUSH: 10.0000 mL | INTRAVENOUS | Status: DC | PRN

## 2021-10-25 MED ORDER — GLYCOPYRROLATE 0.2 MG/ML IJ SOLN
0.1000 mg | Freq: Once | INTRAMUSCULAR | Status: AC
  Administered 2021-10-25: 0.1 mg via INTRAVENOUS
  Filled 2021-10-25: qty 1

## 2021-10-25 MED ORDER — INSULIN ASPART 100 UNIT/ML IJ SOLN
0.0000 [IU] | Freq: Four times a day (QID) | INTRAMUSCULAR | Status: DC
  Administered 2021-10-28: 1 [IU] via SUBCUTANEOUS
  Administered 2021-10-28: 2 [IU] via SUBCUTANEOUS
  Administered 2021-10-28: 1 [IU] via SUBCUTANEOUS
  Filled 2021-10-25 (×4): qty 1

## 2021-10-25 MED ORDER — NEOSTIGMINE METHYLSULFATE 10 MG/10ML IV SOLN
1.0000 mg | Freq: Once | INTRAVENOUS | Status: DC
  Filled 2021-10-25: qty 1

## 2021-10-25 MED ORDER — TRACE MINERALS CU-MN-SE-ZN 300-55-60-3000 MCG/ML IV SOLN
INTRAVENOUS | Status: AC
  Filled 2021-10-25: qty 366.73

## 2021-10-25 NOTE — Progress Notes (Signed)
Nutrition Follow-up  DOCUMENTATION CODES:   Not applicable  INTERVENTION:   TPN per pharmacy   Ensure Enlive po TID, each supplement provides 350 kcal and 20 grams of protein.  MVI po daily   Pt at high refeed risk; recommend monitor potassium, magnesium and phosphorus labs daily until stable  NUTRITION DIAGNOSIS:   Inadequate oral intake related to acute illness as evidenced by other (comment) (pt on NPO/clear liquid diet since admission).  GOAL:   Patient will meet greater than or equal to 90% of their needs -Progressing   MONITOR:   PO intake, Supplement acceptance, Labs, Weight trends, Skin, I & O's, TPN  ASSESSMENT:   71 y.o. male with medical history significant of Ogilvie's syndrome, ileus, hypertension, hyperlipidemia, COPD, MI, stroke with mild left-sided weakness, GERD, gout, depression with anxiety, seizure, DDD, CAD, MI,  alcohol abuse, CKD stage IIIa, dCHF, colon cancer and thyroid cancer who is admitted with colonic ileus secondary to Ogilvie syndrome.  Pt eating 100% of his full liquid trays and is drinking Ensure supplements. Pt denies any nausea, vomiting or abdomen pain. Abdominal distension improved. KUB from today reports improvement in colonic distension. Plan today is to start TPN for supplemental nutrition per MD. Pt with h/o CHF so will monitor daily weights. Pt remains at high refeed risk. Per chart, pt appears weight stable since admission. Pt -533ml on his I & Os.   Medications reviewed and include: aspirin, B-complex with C, oscal w/ D, heparin, MVI, nicotine, protonix, miralax, ampicillin  Labs reviewed: K 4.0 wnl, P 3.6 wnl, Mg 2.1 wnl Cbgs- 96, 87 x 24 hrs  Diet Order:   Diet Order             Diet full liquid Room service appropriate? Yes; Fluid consistency: Thin  Diet effective now                  EDUCATION NEEDS:   No education needs have been identified at this time  Skin:  Skin Assessment: Reviewed RN Assessment (coccyx-  small opening about 0.5 cm deep that appears to be healed)  Last BM:  2/20- type 7  Height:   Ht Readings from Last 1 Encounters:  10/02/21 5' 11.5" (1.816 m)    Weight:   Wt Readings from Last 1 Encounters:  10/25/21 97.3 kg    Ideal Body Weight:  79.5 kg  BMI:  Body mass index is 29.5 kg/m.  Estimated Nutritional Needs:   Kcal:  2100-2400kcal/day  Protein:  105-120g/day  Fluid:  2.0-2.3L/day  Koleen Distance MS, RD, LDN Please refer to [facility] for RD and/or RD on-call/weekend/after hours pager

## 2021-10-25 NOTE — Progress Notes (Signed)
GI Inpatient Follow-up Note  Subjective:  Patient seen and had another dose of neostigmine this morning with incomplete response. Colon diameter improved from previously and is softer but minimal output.  Scheduled Inpatient Medications:   ALPRAZolam  0.5 mg Oral BID   aspirin  81 mg Oral Daily   B-complex with vitamin C  1 tablet Oral Daily   calcium-vitamin D  1 tablet Oral BID   chewing gum (ORBIT) sugar free  1 Stick Oral TID   Chlorhexidine Gluconate Cloth  6 each Topical Daily   feeding supplement  237 mL Oral TID BM   gabapentin  800 mg Oral TID   heparin  5,000 Units Subcutaneous Q8H   [START ON 10/26/2021] insulin aspart  0-9 Units Subcutaneous Q6H   lisinopril  5 mg Oral Daily   multivitamin with minerals  1 tablet Oral Daily   nicotine  21 mg Transdermal Daily   nystatin   Topical BID   pantoprazole (PROTONIX) IV  40 mg Intravenous Q24H   polyethylene glycol  17 g Oral Daily   sertraline  50 mg Oral Daily   sodium chloride flush  10-40 mL Intracatheter Q12H   tamsulosin  0.4 mg Oral QPM   tiotropium  18 mcg Inhalation Daily    Continuous Inpatient Infusions:    sodium chloride 10 mL/hr at 10/25/21 1300   ampicillin (OMNIPEN) IV 1 g (10/25/21 1411)   levETIRAcetam Stopped (10/25/21 0315)   TPN ADULT (ION)      PRN Inpatient Medications:  sodium chloride, acetaminophen, albuterol, alum & mag hydroxide-simeth, dextromethorphan-guaiFENesin, diclofenac Sodium, hydrALAZINE, LORazepam, LORazepam, menthol-cetylpyridinium, metoprolol tartrate, nitroGLYCERIN, nystatin, ondansetron (ZOFRAN) IV, sodium chloride flush, traZODone  Review of Systems:  Review of Systems  Constitutional:  Negative for chills and fever.  HENT:  Negative for nosebleeds.   Respiratory:  Negative for shortness of breath.   Cardiovascular:  Negative for chest pain.  Gastrointestinal:  Negative for abdominal pain, blood in stool, constipation, diarrhea, nausea and vomiting.  Genitourinary:   Positive for frequency.  Skin:  Negative for itching and rash.  Neurological:  Negative for focal weakness.  Psychiatric/Behavioral:  Negative for substance abuse.   All other systems reviewed and are negative.    Physical Examination: BP 136/75    Pulse 63    Temp 98 F (36.7 C) (Axillary)    Resp 17    Wt 97.3 kg    SpO2 99%    BMI 29.50 kg/m  Gen: NAD, alert and oriented x 4 HEENT: PEERLA, EOMI, Neck: supple, no JVD or thyromegaly Chest: No respiratory distress Abd: distended but soft Ext: no edema, well perfused with 2+ pulses, Skin: no rash or lesions noted Lymph: no LAD  Data: Lab Results  Component Value Date   WBC 5.0 10/25/2021   HGB 12.3 (L) 10/25/2021   HCT 39.0 10/25/2021   MCV 95.4 10/25/2021   PLT 181 10/25/2021   Recent Labs  Lab 10/23/21 0451 10/24/21 0425 10/25/21 0448  HGB 12.3* 11.4* 12.3*   Lab Results  Component Value Date   NA 140 10/25/2021   K 4.0 10/25/2021   CL 110 10/25/2021   CO2 24 10/25/2021   BUN 7 (L) 10/25/2021   CREATININE 1.06 10/25/2021   Lab Results  Component Value Date   ALT 20 10/16/2021   AST 20 10/16/2021   ALKPHOS 67 10/16/2021   BILITOT 0.6 10/16/2021   No results for input(s): APTT, INR, PTT in the last 168 hours. Assessment/Plan: Calvin [name]  is a 71 y.o. gentleman with multiple medical problems including COPD, chronic abdominal pain, CVA's, and history of colon cancer s/p sigmoidectomy here with ileus/ogilvies due to chronic narcotics  Recommendations:  - agree with starting TPN - will check abdominal x-ray in the morning - will make NPO at midnight, if no improvement in stool output and/or gaseous distension will consider colonic decompression - continue miralax daily - replete electrolytes - PT/OT and out of bed to chair   Please call with any questions or concerns   Raylene Miyamoto MD, MPH [place]

## 2021-10-25 NOTE — Progress Notes (Signed)
PHARMACY - TOTAL PARENTERAL NUTRITION CONSULT NOTE   Indication: Ogilivie Syndrome   Patient Measurements: Weight: 97.3 kg (214 lb 8.1 oz)   Body mass index is 29.5 kg/m.  Assessment: 71 y.o. male with medical history significant of Ogilvie's syndrome, ileus, hypertension, hyperlipidemia, COPD, MI, stroke with mild left-sided weakness, GERD, gout, depression with anxiety, seizure, DDD, CAD, MI,  alcohol abuse, CKD stage IIIa, dCHF, colon cancer and thyroid cancer who is admitted with colonic ileus secondary to Ogilvie syndrome.  Glucose / Insulin: on no insulin prior to admission (non-diabetic) Electrolytes: hypokalemia Renal: SCr~1, stable Hepatic: CMP in am Intake / Output; MIVF: no MIVF GI Imaging: 02/21 abd x-ray: Colonic gas dilatation is still seen, maximum up to 10 cm in the transverse segment, but with improvement with previous maximum diameter 12.1 cm. GI Surgeries / Procedures: no recent  Central access: 10/25/21 TPN start date: 10/25/21  Nutritional Goals: Goal TPN rate is 80 mL/hr (provides 110 g of protein and 2102 kcals per day)  RD Assessment: Estimated Needs Total Energy Estimated Needs: 2100-2400kcal/day Total Protein Estimated Needs: 105-120g/day Total Fluid Estimated Needs: 2.0-2.3L/day  Current Nutrition:  Full liquids  Plan:  Start TPN at 86mL/hr at 1800 (total volume including overfill 1060 mL) Nutritional components: Amino acids (using Clinisol 15%): 55 grams Dextrose: 151. 7 grams Lipids (using 20% SMOFlipids): 31.4 grams kCal: 1049.8 / 24h Electrolytes in TPN (standard): Na 33mEq/L, K 58mEq/L, Ca 54mEq/L, Mg 62mEq/L, and Phos 12mmol/L. Cl:Ac 1:1 Add standard MVI (MWF only d/t national b/o status) and trace elements to TPN Initiate Sensitive q6h SSI and adjust as needed  Monitor TPN labs on Mon/Thurs, daily until stable  Dallie Piles 10/25/2021,8:09 AM

## 2021-10-25 NOTE — Progress Notes (Signed)
Peripherally Inserted Central Catheter Placement  The IV Nurse has discussed with the patient and/or persons authorized to consent for the patient, the purpose of this procedure and the potential benefits and risks involved with this procedure.  The benefits include less needle sticks, lab draws from the catheter, and the patient may be discharged home with the catheter. Risks include, but not limited to, infection, bleeding, blood clot (thrombus formation), and puncture of an artery; nerve damage and irregular heartbeat and possibility to perform a PICC exchange if needed/ordered by physician.  Alternatives to this procedure were also discussed.  Bard Power PICC patient education guide, fact sheet on infection prevention and patient information card has been provided to patient /or left at bedside.    PICC Placement Documentation  PICC Double Lumen 46/27/03 Right Basilic 44 cm 1 cm (Active)  Indication for Insertion or Continuance of Line Administration of hyperosmolar/irritating solutions (i.e. TPN, Vancomycin, etc.) 10/25/21 1301  Exposed Catheter (cm) 1 cm 10/25/21 1301  Site Assessment Clean, Dry, Intact 10/25/21 1301  Lumen #1 Status Flushed;Saline locked;Blood return noted 10/25/21 1301  Lumen #2 Status Flushed;Saline locked;Blood return noted 10/25/21 1301  Dressing Type Securing device;Transparent 10/25/21 1301  Dressing Status Antimicrobial disc in place;Clean, Dry, Intact 10/25/21 1301  Safety Lock Not Applicable 50/09/38 1829  Line Care Connections checked and tightened 10/25/21 1301  Line Adjustment (NICU/IV Team Only) No 10/25/21 1301  Dressing Intervention New dressing;Other (Comment) 10/25/21 1301  Dressing Change Due 11/01/21 10/25/21 1301       Enos Fling 10/25/2021, 1:03 PM

## 2021-10-25 NOTE — Progress Notes (Signed)
PROGRESS NOTE    Calvin [name]  ERD:[id number] DOB: [birth year] DOA: 10/16/2021 PCP: Pcp, No   Brief Narrative:  71 y.o. male with medical history significant of Ogilvie's syndrome, ileus, hypertension, hyperlipidemia, COPD, stroke with mild left-sided weakness, GERD, gout, depression with anxiety, seizure, CAD, MI,  alcohol abuse, CKD stage IIIa, dCHF, colon cancer, thyroid cancer, who presents with nausea vomiting, abdominal pain.    Patient is a poor historian, history is limited. Pt states that has been having abdominal pain for 2 days, associated with nausea, nonbilious nonbloody vomiting several times.  He also has abdominal distention.  Per his daughter (I called her daughter by phone), she noticed patient's abdominal distention several days ago.  Patient states that he has diarrhea, but per nurse observation, patient's stools are formed in the ED.  Patient does not have chest pain, cough, shortness of breath.  Patient has fever 100.4 in ED. patient continues to have ileus.  GI and general surgery following.Due to persistent ileus/pseudoobstruction, neostigmine was administered 2/20 with decent response.  Repeat dose again on 2/21 with poor response therefore will start TPN today.     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  Persistent abdominal distention.  Repeat scans does not show any obvious obstruction.  Had good response to neostigmine on 2/20.  He had poor response to neostigmine again this morning therefore we will go and start TPN.  PICC line ordered. -GI and general surgery following.  History of Ogilvie syndrome Laxatives on hold   Possible UTI, felt unlikely Sepsis ruled out -Ucx grew Enteroccocus, On Ampicillin.  Complete 7-day course   CAD (coronary artery disease), no chest pain. -Continue aspirin and statin   COPD (chronic obstructive pulmonary disease) ([place]):  Stable -Bronchodilators as needed   Depression with anxiety -Continue home medications when able to tolerate. Home PO meds - Zoloft and Xanax.    HLD (hyperlipidemia) -Continue statin   HTN (hypertension) -Currently home medications are on hold.  IV hydralazine and Lopressor as needed   Seizure -Seizure precautions.  Currently on IV Keppra   History of Stroke ([place]) -ASA and zocor   Chronic kidney disease, stage 3a ([place]): stable -f/u by BMP   Tobacco abuse: -Nicotine patch    DVT prophylaxis: SQ Heparin Code Status: DNR Family Communication: [name] updated 2/20; no answer today.   Status is: Inpatient Remains inpatient appropriate because: Maintain patient in stepdown today.  He is getting neostigmine.  Await bowel function to return and thereafter slowly advance diet as tolerated.  Subjective: Poor respose to neostigmine this morning. Some abd distention. Passing gas. No BM in rectal tube during my visit.   Examination: Constitutional: Not in acute distress Respiratory: Clear to auscultation bilaterally Cardiovascular: Normal sinus rhythm, no rubs Abdomen: Distended abd + BS Musculoskeletal: No edema noted Skin: No rashes seen Neurologic: CN 2-12 grossly intact.  And nonfocal Psychiatric: Normal judgment and insight. Alert and oriented x 3. Normal mood.   Rectal tube in place.    Objective: Vitals:   10/25/21 0500 10/25/21 0600 10/25/21 0700 10/25/21 0800  BP: 125/71 118/69 129/73 133/80  Pulse: 62 60 63 70  Resp: 19 15 18 14   Temp:    97.8 F (36.6 C)  TempSrc:    Axillary  SpO2: 96% 94% 96% 98%  Weight:        Intake/Output Summary (Last 24 hours) at 10/25/2021 0851 Last data filed at 10/25/2021  0800 Gross per 24 hour  Intake 4457.38 ml  Output 2500 ml  Net 1957.38 ml   Filed Weights   10/20/21 0440 10/22/21 0500 10/25/21 0441  Weight: 94 kg 98.5 kg 97.3 kg     Data Reviewed:   CBC: Recent Labs  Lab 10/21/21 0600 10/22/21 0300  10/23/21 0451 10/24/21 0425 10/25/21 0448  WBC 3.9* 3.4* 3.3* 4.8 5.0  HGB 12.0* 11.3* 12.3* 11.4* 12.3*  HCT 36.4* 34.8* 38.6* 35.3* 39.0  MCV 93.1 93.3 93.5 93.6 95.4  PLT 155 165 181 161 749   Basic Metabolic Panel: Recent Labs  Lab 10/21/21 0600 10/22/21 0530 10/23/21 0451 10/24/21 0425 10/25/21 0448  NA 142 142 141 142 140  K 3.6 4.4 3.7 3.4* 4.0  CL 115* 112* 112* 112* 110  CO2 21* 23 21* 24 24  GLUCOSE 99 99 105* 79 91  BUN 12 9 8  7* 7*  CREATININE 0.99 1.10 0.92 0.98 1.06  CALCIUM 8.4* 8.6* 8.7* 8.3* 8.5*  MG 1.9 1.9 1.7 1.6* 2.1  PHOS  --   --   --   --  3.6   GFR: Estimated Creatinine Clearance: 75.6 mL/min (by C-G formula based on SCr of 1.06 mg/dL). Liver Function Tests: No results for input(s): AST, ALT, ALKPHOS, BILITOT, PROT, ALBUMIN in the last 168 hours.  No results for input(s): LIPASE, AMYLASE in the last 168 hours.  No results for input(s): AMMONIA in the last 168 hours. Coagulation Profile: No results for input(s): INR, PROTIME in the last 168 hours.  Cardiac Enzymes: No results for input(s): CKTOTAL, CKMB, CKMBINDEX, TROPONINI in the last 168 hours. BNP (last 3 results) No results for input(s): PROBNP in the last 8760 hours. HbA1C: No results for input(s): HGBA1C in the last 72 hours. CBG: Recent Labs  Lab 10/24/21 0520 10/24/21 1153 10/24/21 1826 10/24/21 2306 10/25/21 0609  GLUCAP 74 96 80 128* 87   Lipid Profile: No results for input(s): CHOL, HDL, LDLCALC, TRIG, CHOLHDL, LDLDIRECT in the last 72 hours. Thyroid Function Tests: No results for input(s): TSH, T4TOTAL, FREET4, T3FREE, THYROIDAB in the last 72 hours. Anemia Panel: No results for input(s): VITAMINB12, FOLATE, FERRITIN, TIBC, IRON, RETICCTPCT in the last 72 hours. Sepsis Labs: No results for input(s): PROCALCITON, LATICACIDVEN in the last 168 hours.   Recent Results (from the past 240 hour(s))  Resp Panel by RT-PCR (Flu A&B, Covid) Nasopharyngeal Swab     Status:  None   Collection Time: 10/16/21  6:43 AM   Specimen: Nasopharyngeal Swab; Nasopharyngeal(NP) swabs in vial transport medium  Result Value Ref Range Status   SARS Coronavirus 2 by RT PCR NEGATIVE NEGATIVE Final    Comment: (NOTE) SARS-CoV-2 target nucleic acids are NOT DETECTED.  The SARS-CoV-2 RNA is generally detectable in upper respiratory specimens during the acute phase of infection. The lowest concentration of SARS-CoV-2 viral copies this assay can detect is 138 copies/mL. A negative result does not preclude SARS-Cov-2 infection and should not be used as the sole basis for treatment or other patient management decisions. A negative result may occur with  improper specimen collection/handling, submission of specimen other than nasopharyngeal swab, presence of viral mutation(s) within the areas targeted by this assay, and inadequate number of viral copies(<138 copies/mL). A negative result must be combined with clinical observations, patient history, and epidemiological information. The expected result is Negative.  Fact Sheet for Patients:  EntrepreneurPulse.com.au  Fact Sheet for Healthcare Providers:  IncredibleEmployment.be  This test is no t yet approved or  cleared by the [place] and  has been authorized for detection and/or diagnosis of SARS-CoV-2 by FDA under an Emergency Use Authorization (EUA). This EUA will remain  in effect (meaning this test can be used) for the duration of the COVID-19 declaration under Section 564(b)(1) of the Act, 21 U.S.C.section 360bbb-3(b)(1), unless the authorization is terminated  or revoked sooner.       Influenza A by PCR NEGATIVE NEGATIVE Final   Influenza B by PCR NEGATIVE NEGATIVE Final    Comment: (NOTE) The Xpert Xpress SARS-CoV-2/FLU/RSV plus assay is intended as an aid in the diagnosis of influenza from Nasopharyngeal swab specimens and should not be used as a sole basis for  treatment. Nasal washings and aspirates are unacceptable for Xpert Xpress SARS-CoV-2/FLU/RSV testing.  Fact Sheet for Patients: EntrepreneurPulse.com.au  Fact Sheet for Healthcare Providers: IncredibleEmployment.be  This test is not yet approved or cleared by the [place] FDA and has been authorized for detection and/or diagnosis of SARS-CoV-2 by FDA under an Emergency Use Authorization (EUA). This EUA will remain in effect (meaning this test can be used) for the duration of the COVID-19 declaration under Section 564(b)(1) of the Act, 21 U.S.C. section 360bbb-3(b)(1), unless the authorization is terminated or revoked.  Performed at [facility], [address] [place], [place] [address]   Blood culture (routine single)     Status: None   Collection Time: 10/16/21  7:00 AM   Specimen: BLOOD  Result Value Ref Range Status   Specimen Description BLOOD ARM  Final   Special Requests   Final    BOTTLES DRAWN AEROBIC AND ANAEROBIC Blood Culture adequate volume   Culture   Final    NO GROWTH 5 DAYS Performed at [facility], [address] [place], [place] [address]    Report Status 10/21/2021 FINAL  Final  Urine Culture     Status: Abnormal   Collection Time: 10/16/21 10:24 AM   Specimen: In/Out Cath Urine  Result Value Ref Range Status   Specimen Description   Final    IN/OUT CATH URINE Performed at [facility], [address] [place], [place] [address]    Special Requests   Final    NONE Performed at [facility], [place], [place] [address]    Culture 60,000 COLONIES/mL ENTEROCOCCUS FAECALIS (A)  Final   Report Status 10/19/2021 FINAL  Final   Organism ID, Bacteria ENTEROCOCCUS FAECALIS (A)  Final      Susceptibility   Enterococcus faecalis - MIC*    AMPICILLIN <=2 SENSITIVE Sensitive     NITROFURANTOIN <=16 SENSITIVE Sensitive     VANCOMYCIN 1 SENSITIVE Sensitive     *  60,000 COLONIES/mL ENTEROCOCCUS FAECALIS  C Difficile Quick Screen w PCR reflex     Status: None   Collection Time: 10/16/21  1:39 PM   Specimen: STOOL  Result Value Ref Range Status   C Diff antigen NEGATIVE NEGATIVE Final   C Diff toxin NEGATIVE NEGATIVE Final   C Diff interpretation No C. difficile detected.  Final    Comment: Performed at [facility], [address] [place],  [address]  Gastrointestinal Panel by PCR , Stool     Status: None   Collection Time: 10/16/21  1:39 PM   Specimen: STOOL  Result Value Ref Range Status   Campylobacter species NOT DETECTED NOT DETECTED Final   Plesimonas shigelloides NOT DETECTED NOT DETECTED Final   Salmonella species NOT DETECTED NOT  DETECTED Final   Yersinia enterocolitica NOT DETECTED NOT DETECTED Final   Vibrio species NOT DETECTED NOT DETECTED Final   Vibrio cholerae NOT DETECTED NOT DETECTED Final   Enteroaggregative E coli (EAEC) NOT DETECTED NOT DETECTED Final   Enteropathogenic E coli (EPEC) NOT DETECTED NOT DETECTED Final   Enterotoxigenic E coli (ETEC) NOT DETECTED NOT DETECTED Final   Shiga like toxin producing E coli (STEC) NOT DETECTED NOT DETECTED Final   Shigella/Enteroinvasive E coli (EIEC) NOT DETECTED NOT DETECTED Final   Cryptosporidium NOT DETECTED NOT DETECTED Final   Cyclospora cayetanensis NOT DETECTED NOT DETECTED Final   Entamoeba histolytica NOT DETECTED NOT DETECTED Final   Giardia lamblia NOT DETECTED NOT DETECTED Final   Adenovirus F40/41 NOT DETECTED NOT DETECTED Final   Astrovirus NOT DETECTED NOT DETECTED Final   Norovirus GI/GII NOT DETECTED NOT DETECTED Final   Rotavirus A NOT DETECTED NOT DETECTED Final   Sapovirus (I, II, IV, and V) NOT DETECTED NOT DETECTED Final    Comment: Performed at [facility], [address] [place], [place] [address]  MRSA Next Gen by PCR, Nasal     Status: None   Collection Time: 10/17/21  4:15 AM   Specimen: Nasal Mucosa; Nasal Swab  Result  Value Ref Range Status   MRSA by PCR Next Gen NOT DETECTED NOT DETECTED Final    Comment: (NOTE) The GeneXpert MRSA Assay (FDA approved for NASAL specimens only), is one component of a comprehensive MRSA colonization surveillance program. It is not intended to diagnose MRSA infection nor to guide or monitor treatment for MRSA infections. Test performance is not FDA approved in patients less than 69 years old. Performed at [facility], [address], [place], [place] [address]          Radiology Studies: DG Abd 1 View  Result Date: 10/25/2021 CLINICAL DATA:  Follow-up ileus. EXAM: ABDOMEN - 1 VIEW COMPARISON:  Study of 10/21/2021. FINDINGS: Gaseous distention throughout the large bowel is still seen up to 10 cm in the transverse segment, but slightly improved with previous transverse colonic diameter 12.1 cm. No dilated small bowel is seen. There is no supine evidence for free air. No abnormal masses or calcifications are seen aside from pelvic vascular calcifications. There is an asymmetrically elevated right hemidiaphragm, with the lung bases clear. Bladder is catheterized. IMPRESSION: Colonic gas dilatation is still seen, maximum up to 10 cm in the transverse segment, but with improvement with previous maximum diameter 12.1 cm. In all other respects no further changes. Electronically Signed   By: Telford Nab M.D.   On: 10/25/2021 06:11        Scheduled Meds:  (feeding supplement) PROSource Plus  30 mL Oral BID BM   ALPRAZolam  0.5 mg Oral BID   aspirin  81 mg Oral Daily   B-complex with vitamin C  1 tablet Oral Daily   calcium-vitamin D  1 tablet Oral BID   chewing gum (ORBIT) sugar free  1 Stick Oral TID   Chlorhexidine Gluconate Cloth  6 each Topical Daily   feeding supplement  237 mL Oral TID BM   gabapentin  800 mg Oral TID   glycopyrrolate  0.1 mg Intravenous Once   heparin  5,000 Units Subcutaneous Q8H   lisinopril  5 mg Oral Daily   multivitamin with  minerals  1 tablet Oral Daily   neostigmine  1 mg Intravenous Once   nicotine  21 mg Transdermal Daily   pantoprazole (PROTONIX)  IV  40 mg Intravenous Q24H   polyethylene glycol  17 g Oral Daily   sertraline  50 mg Oral Daily   tamsulosin  0.4 mg Oral QPM   tiotropium  18 mcg Inhalation Daily   Continuous Infusions:  sodium chloride 10 mL/hr at 10/25/21 0800   ampicillin (OMNIPEN) IV 1 g (10/25/21 6435)   levETIRAcetam Stopped (10/25/21 0315)     LOS: 9 days   Time spent= 35 mins    Hamish Banks Arsenio Loader, MD [facility]  If 7PM-7AM, please contact night-coverage  10/25/2021, 8:51 AM

## 2021-10-25 NOTE — Progress Notes (Signed)
Shift Summary: Patient A/O x 4 throughout the shift. Baseline musculoskeletal deficits present and stable. VSS. Patient had several apneic episodes while sleeping, O2 remained 89% or higher. FMS had no output this shift. 763ml UOP via external catheter. Abdomen remains distended but fairly soft. Hypoactive BS x 4. No complaints or pain, nausea, vomiting. Report to be given to Verdis Frederickson, RN and pt care transferred.

## 2021-10-26 ENCOUNTER — Inpatient Hospital Stay: Payer: Medicare Other

## 2021-10-26 ENCOUNTER — Encounter: Payer: Self-pay | Admitting: Internal Medicine

## 2021-10-26 ENCOUNTER — Inpatient Hospital Stay: Payer: Medicare Other | Admitting: Anesthesiology

## 2021-10-26 ENCOUNTER — Encounter
Admission: EM | Disposition: A | Discharge status: Skilled Nursing Facility | Payer: Self-pay | Source: Skilled Nursing Facility | Attending: Internal Medicine

## 2021-10-26 DIAGNOSIS — R109 Unspecified abdominal pain: Secondary | ICD-10-CM | POA: Diagnosis not present

## 2021-10-26 HISTORY — PX: COLONOSCOPY WITH PROPOFOL: SHX5780

## 2021-10-26 LAB — COMPREHENSIVE METABOLIC PANEL
ALT: 22 U/L (ref 0–44)
AST: 19 U/L (ref 15–41)
Albumin: 3.1 g/dL — ABNORMAL LOW (ref 3.5–5.0)
Alkaline Phosphatase: 47 U/L (ref 38–126)
Anion gap: 6 (ref 5–15)
BUN: 8 mg/dL (ref 8–23)
CO2: 27 mmol/L (ref 22–32)
Calcium: 9 mg/dL (ref 8.9–10.3)
Chloride: 109 mmol/L (ref 98–111)
Creatinine, Ser: 0.96 mg/dL (ref 0.61–1.24)
GFR, Estimated: >60 mL/min (ref >60)
Glucose, Bld: 108 mg/dL — ABNORMAL HIGH (ref 70–99)
Potassium: 4.4 mmol/L (ref 3.5–5.1)
Sodium: 142 mmol/L (ref 135–145)
Total Bilirubin: 0.3 mg/dL (ref 0.3–1.2)
Total Protein: 6.1 g/dL — ABNORMAL LOW (ref 6.5–8.1)

## 2021-10-26 LAB — CBC
HCT: 36.4 % — ABNORMAL LOW (ref 39.0–52.0)
Hemoglobin: 11.8 g/dL — ABNORMAL LOW (ref 13.0–17.0)
MCH: 30.3 pg (ref 26.0–34.0)
MCHC: 32.4 g/dL (ref 30.0–36.0)
MCV: 93.6 fL (ref 80.0–100.0)
Platelets: 180 10*3/uL (ref 150–400)
RBC: 3.89 MIL/uL — ABNORMAL LOW (ref 4.22–5.81)
RDW: 13 % (ref 11.5–15.5)
WBC: 4.7 10*3/uL (ref 4.0–10.5)
nRBC: 0 % (ref 0.0–0.2)

## 2021-10-26 LAB — GLUCOSE, CAPILLARY
Glucose-Capillary: 110 mg/dL — ABNORMAL HIGH (ref 70–99)
Glucose-Capillary: 108 mg/dL — ABNORMAL HIGH (ref 70–99)
Glucose-Capillary: 104 mg/dL — ABNORMAL HIGH (ref 70–99)

## 2021-10-26 LAB — PHOSPHORUS: Phosphorus: 4 mg/dL (ref 2.5–4.6)

## 2021-10-26 LAB — MAGNESIUM: Magnesium: 1.7 mg/dL (ref 1.7–2.4)

## 2021-10-26 LAB — TRIGLYCERIDES: Triglycerides: 85 mg/dL (ref <150)

## 2021-10-26 SURGERY — COLONOSCOPY WITH PROPOFOL
Anesthesia: General

## 2021-10-26 MED ORDER — SODIUM CHLORIDE 0.9 % IV SOLN: INTRAVENOUS | Status: DC

## 2021-10-26 MED ORDER — PROPOFOL 10 MG/ML IV BOLUS
INTRAVENOUS | Status: DC | PRN
  Administered 2021-10-26: 60 mg via INTRAVENOUS

## 2021-10-26 MED ORDER — PROPOFOL 500 MG/50ML IV EMUL
INTRAVENOUS | Status: DC | PRN
  Administered 2021-10-26: 160 ug/kg/min via INTRAVENOUS

## 2021-10-26 MED ORDER — TRACE MINERALS CU-MN-SE-ZN 300-55-60-3000 MCG/ML IV SOLN
INTRAVENOUS | Status: AC
  Filled 2021-10-26: qty 733.47

## 2021-10-26 MED ORDER — LIDOCAINE HCL (CARDIAC) PF 100 MG/5ML IV SOSY
PREFILLED_SYRINGE | INTRAVENOUS | Status: DC | PRN
  Administered 2021-10-26: 50 mg via INTRAVENOUS

## 2021-10-26 MED ORDER — PHENYLEPHRINE 40 MCG/ML (10ML) SYRINGE FOR IV PUSH (FOR BLOOD PRESSURE SUPPORT)
PREFILLED_SYRINGE | INTRAVENOUS | Status: DC | PRN
  Administered 2021-10-26: 80 ug via INTRAVENOUS

## 2021-10-26 MED ORDER — TRACE MINERALS CU-MN-SE-ZN 300-55-60-3000 MCG/ML IV SOLN: INTRAVENOUS | Status: DC

## 2021-10-26 MED ORDER — MAGNESIUM SULFATE 2 GM/50ML IV SOLN
2.0000 g | Freq: Once | INTRAVENOUS | Status: AC
  Administered 2021-10-26: 2 g via INTRAVENOUS
  Filled 2021-10-26: qty 50

## 2021-10-26 NOTE — Transfer of Care (Signed)
Immediate Anesthesia Transfer of Care Note  Patient: Calvin [name]  Procedure(s) Performed: COLONOSCOPY WITH PROPOFOL  Patient Location: PACU  Anesthesia Type:General  Level of Consciousness: awake  Airway & Oxygen Therapy: Patient Spontanous Breathing  Post-op Assessment: Report given to RN and Post -op Vital signs reviewed and stable  Post vital signs: Reviewed and stable  Last Vitals:  Vitals Value Taken Time  BP 104/91 10/26/21 1613  Temp    Pulse 89 10/26/21 1613  Resp 18 10/26/21 1613  SpO2 99 % 10/26/21 1613    Last Pain:  Vitals:   10/26/21 1500  TempSrc:   PainSc: 9          Complications: No notable events documented.

## 2021-10-26 NOTE — Progress Notes (Signed)
PROGRESS NOTE    Calvin [name]  YIF:[id number] DOB: [birth year] DOA: 10/16/2021 PCP: Pcp, No    Brief Narrative:  71 y.o. male with medical history significant of Ogilvie's syndrome, ileus, hypertension, hyperlipidemia, COPD, stroke with mild left-sided weakness, GERD, gout, depression with anxiety, seizure, CAD, MI,  alcohol abuse, CKD stage IIIa, dCHF, colon cancer, thyroid cancer, who presents with nausea vomiting, abdominal pain.    Patient is a poor historian, history is limited. Pt states that has been having abdominal pain for 2 days, associated with nausea, nonbilious nonbloody vomiting several times.  He also has abdominal distention.  Per his daughter (I called her daughter by phone), she noticed patient's abdominal distention several days ago.  Patient states that he has diarrhea, but per nurse observation, patient's stools are formed in the ED.  Patient does not have chest pain, cough, shortness of breath.  Patient has fever 100.4 in ED. patient continues to have ileus.  GI and general surgery following.Due to persistent ileus/pseudoobstruction, neostigmine was administered 2/20 with decent response.  Repeat dose again on 2/21 with poor response.  TPN initiated 2/21  GI will perform decompressive therapy via colonoscope 2/22.  Patient remains stepdown status on neostigmine.   Assessment & Plan:   Principal Problem:   Abdominal pain Active Problems:   Seizures (HCC)   HTN (hypertension)   CAD (coronary artery disease)   COPD (chronic obstructive pulmonary disease) (HCC)   Depression with anxiety   Ogilvie's syndrome   UTI (urinary tract infection)   HLD (hyperlipidemia)   Stroke (HCC)   Sepsis (HCC)   Chronic kidney disease, stage 3a (HCC)   Tobacco abuse   Abdominal distention  Abdominal pain secondary to partial small bowel obstruction/ileus -Concern due to Ogilivie Syndrome from Narcotic use.  Persistent abdominal distention.  Repeat scans does not show any obvious  obstruction.  Had good response to neostigmine on 2/20.  He had poor response to neostigmine therefore TPN was initiated. -GI and general surgery following. Plan: Decompressive therapy via colonoscope today with Dr. Haig Prophet   History of Ogilvie syndrome Laxatives on hold   Possible UTI, felt unlikely Sepsis ruled out -Ucx grew Enteroccocus, On Ampicillin.  Complete 7-day course   CAD (coronary artery disease), no chest pain. -Continue aspirin and statin   COPD (chronic obstructive pulmonary disease) ([place]): Stable -Bronchodilators as needed   Depression with anxiety -Continue home medications when able to tolerate.  Home PO meds - Zoloft and Xanax.    HLD (hyperlipidemia) -Continue statin   HTN (hypertension) -Currently home medications are on hold.  IV hydralazine and Lopressor as needed   Seizure -Seizure precautions.  Currently on IV Keppra   History of Stroke ([place]) -ASA and zocor   Chronic kidney disease, stage 3a ([place]): stable -f/u by BMP   Tobacco abuse: -Nicotine patch  Possible cognitive decline Patient with poor insight into his disease process We will reevaluate and reach out to family later date    DVT prophylaxis: SQ heparin Code Status: Full Family Communication: None today Disposition Plan: Status is: Inpatient Remains inpatient appropriate because: Ogilvie syndrome on TPN.  Decompressive therapy via colonoscopy today   Level of care: Stepdown  Consultants:  GI  Procedures:  Colonoscopy 2/22  Antimicrobials: None   Subjective: Seen and examined.  Continues to endorse "not feeling well" unable to provide further history on this  Objective: Vitals:   10/26/21 1300 10/26/21 1400 10/26/21 1500 10/26/21 1503  BP: (!) 146/82 (!) 152/100 (!) 145/78  Pulse: 77 84 86   Resp: (!) 21 (!) 21 20   Temp:      TempSrc:      SpO2: 98% 97% 98%   Weight:    92.6 kg  Height:    5' 11.5" (1.816 m)    Intake/Output Summary (Last 24 hours) at  10/26/2021 1542 Last data filed at 10/26/2021 1445 Gross per 24 hour  Intake 1423.73 ml  Output 4650 ml  Net -3226.27 ml   Filed Weights   10/25/21 0441 10/26/21 0500 10/26/21 1503  Weight: 97.3 kg 96.2 kg 92.6 kg    Examination:  General exam: Appears calm and comfortable  Respiratory system: Clear to auscultation. Respiratory effort normal. Cardiovascular system: S1 & S2 heard, RRR. No JVD, murmurs, rubs, gallops or clicks. No pedal edema. Gastrointestinal system: Mild TTP, distended, hyperactive bowel sounds Central nervous system: Alert and oriented. No focal neurological deficits. Extremities: Symmetric 5 x 5 power. Skin: No rashes, lesions or ulcers Psychiatry: Judgement and insight appear impaired. Mood & affect flattened.     Data Reviewed: I have personally reviewed following labs and imaging studies  CBC: Recent Labs  Lab 10/22/21 0300 10/23/21 0451 10/24/21 0425 10/25/21 0448 10/26/21 0330  WBC 3.4* 3.3* 4.8 5.0 4.7  HGB 11.3* 12.3* 11.4* 12.3* 11.8*  HCT 34.8* 38.6* 35.3* 39.0 36.4*  MCV 93.3 93.5 93.6 95.4 93.6  PLT 165 181 161 181 161   Basic Metabolic Panel: Recent Labs  Lab 10/22/21 0530 10/23/21 0451 10/24/21 0425 10/25/21 0448 10/26/21 0330  NA 142 141 142 140 142  K 4.4 3.7 3.4* 4.0 4.4  CL 112* 112* 112* 110 109  CO2 23 21* 24 24 27   GLUCOSE 99 105* 79 91 108*  BUN 9 8 7* 7* 8  CREATININE 1.10 0.92 0.98 1.06 0.96  CALCIUM 8.6* 8.7* 8.3* 8.5* 9.0  MG 1.9 1.7 1.6* 2.1 1.7  PHOS  --   --   --  3.6 4.0   GFR: Estimated Creatinine Clearance: 81.6 mL/min (by C-G formula based on SCr of 0.96 mg/dL). Liver Function Tests: Recent Labs  Lab 10/26/21 0330  AST 19  ALT 22  ALKPHOS 47  BILITOT 0.3  PROT 6.1*  ALBUMIN 3.1*   No results for input(s): LIPASE, AMYLASE in the last 168 hours. No results for input(s): AMMONIA in the last 168 hours. Coagulation Profile: No results for input(s): INR, PROTIME in the last 168 hours. Cardiac  Enzymes: No results for input(s): CKTOTAL, CKMB, CKMBINDEX, TROPONINI in the last 168 hours. BNP (last 3 results) No results for input(s): PROBNP in the last 8760 hours. HbA1C: No results for input(s): HGBA1C in the last 72 hours. CBG: Recent Labs  Lab 10/25/21 1106 10/25/21 1732 10/25/21 2323 10/26/21 0516 10/26/21 1228  GLUCAP 96 104* 126* 110* 108*   Lipid Profile: Recent Labs    10/26/21 0330  TRIG 85   Thyroid Function Tests: No results for input(s): TSH, T4TOTAL, FREET4, T3FREE, THYROIDAB in the last 72 hours. Anemia Panel: No results for input(s): VITAMINB12, FOLATE, FERRITIN, TIBC, IRON, RETICCTPCT in the last 72 hours. Sepsis Labs: No results for input(s): PROCALCITON, LATICACIDVEN in the last 168 hours.  Recent Results (from the past 240 hour(s))  MRSA Next Gen by PCR, Nasal     Status: None   Collection Time: 10/17/21  4:15 AM   Specimen: Nasal Mucosa; Nasal Swab  Result Value Ref Range Status   MRSA by PCR Next Gen NOT DETECTED NOT DETECTED Final  Comment: (NOTE) The GeneXpert MRSA Assay (FDA approved for NASAL specimens only), is one component of a comprehensive MRSA colonization surveillance program. It is not intended to diagnose MRSA infection nor to guide or monitor treatment for MRSA infections. Test performance is not FDA approved in patients less than 70 years old. Performed at [facility], [address] [place], [place] [address]          Radiology Studies: DG Abd 1 View  Result Date: 10/26/2021 CLINICAL DATA:  Follow-up of ileus EXAM: ABDOMEN - 1 VIEW COMPARISON:  Yesterday FINDINGS: Two supine views of the abdomen and pelvis. Right hemidiaphragm elevation. Cardiomegaly. Gas filled colon, with the index transverse colonic diameter of 12.8 cm versus 10.3 cm on the prior. Descending/sigmoid colon at 8.8 cm, similar. No free intraperitoneal air or gaseous distension of small bowel loops. Left hip arthroplasty. IMPRESSION: Slight  increase in gaseous distension of the colon, suggesting colonic ileus. Electronically Signed   By: Abigail Miyamoto M.D.   On: 10/26/2021 09:15   DG Abd 1 View  Result Date: 10/25/2021 CLINICAL DATA:  Follow-up ileus. EXAM: ABDOMEN - 1 VIEW COMPARISON:  Study of 10/21/2021. FINDINGS: Gaseous distention throughout the large bowel is still seen up to 10 cm in the transverse segment, but slightly improved with previous transverse colonic diameter 12.1 cm. No dilated small bowel is seen. There is no supine evidence for free air. No abnormal masses or calcifications are seen aside from pelvic vascular calcifications. There is an asymmetrically elevated right hemidiaphragm, with the lung bases clear. Bladder is catheterized. IMPRESSION: Colonic gas dilatation is still seen, maximum up to 10 cm in the transverse segment, but with improvement with previous maximum diameter 12.1 cm. In all other respects no further changes. Electronically Signed   By: Telford Nab M.D.   On: 10/25/2021 06:11   [place] EKG SITE RITE  Result Date: 10/25/2021 If [facility] image not attached, placement could not be confirmed due to current cardiac rhythm.       Scheduled Meds:  [MAR Hold] ALPRAZolam  0.5 mg Oral BID   [MAR Hold] aspirin  81 mg Oral Daily   [MAR Hold] B-complex with vitamin C  1 tablet Oral Daily   [MAR Hold] calcium-vitamin D  1 tablet Oral BID   [MAR Hold] chewing gum (ORBIT) sugar free  1 Stick Oral TID   [MAR Hold] Chlorhexidine Gluconate Cloth  6 each Topical Daily   [MAR Hold] feeding supplement  237 mL Oral TID BM   [MAR Hold] gabapentin  800 mg Oral TID   [MAR Hold] heparin  5,000 Units Subcutaneous Q8H   [MAR Hold] insulin aspart  0-9 Units Subcutaneous Q6H   [MAR Hold] lisinopril  5 mg Oral Daily   [MAR Hold] multivitamin with minerals  1 tablet Oral Daily   [MAR Hold] nicotine  21 mg Transdermal Daily   [MAR Hold] nystatin   Topical BID   [MAR Hold] pantoprazole (PROTONIX) IV  40 mg Intravenous  Q24H   [MAR Hold] polyethylene glycol  17 g Oral Daily   [MAR Hold] sertraline  50 mg Oral Daily   [MAR Hold] sodium chloride flush  10-40 mL Intracatheter Q12H   [MAR Hold] tamsulosin  0.4 mg Oral QPM   [MAR Hold] tiotropium  18 mcg Inhalation Daily   Continuous Infusions:  [MAR Hold] sodium chloride 10 mL/hr at 10/26/21 1400   sodium chloride 20 mL/hr at 10/26/21 1529   [MAR Hold] levETIRAcetam 1,000 mg (10/26/21 0350)  TPN ADULT (ION) 40 mL/hr at 10/26/21 1400   TPN ADULT (ION)       LOS: 10 days       Sidney Ace, MD [facility]   If 7PM-7AM, please contact night-coverage  10/26/2021, 3:42 PM

## 2021-10-26 NOTE — Anesthesia Procedure Notes (Signed)
Date/Time: 10/26/2021 3:30 PM Performed by: Johnna Acosta, CRNA Pre-anesthesia Checklist: Patient identified, Emergency Drugs available, Suction available, Patient being monitored and Timeout performed Patient Re-evaluated:Patient Re-evaluated prior to induction Oxygen Delivery Method: Nasal cannula Preoxygenation: Pre-oxygenation with 100% oxygen

## 2021-10-26 NOTE — Progress Notes (Signed)
Occupational Therapy Treatment Patient Details Name: Calvin [name] MRN: [id number] DOB: May 28, 1951 Today's Date: 10/26/2021   History of present illness Pt is a 71 y/o M admitted on 10/16/21 after presenting with c/c of N&V & abdominal pain. Pt is being treated for abdominal pain 2/2 partial SBO/ileus.   PMH: Ogilvie's syndrome, ileus, HTN, HLD, COPD, stroke with mild L sided weakness, GERD, gout, depression with anxiety, seizure, CAD, MI, alcohol abuse, CKD stage 3a, dCHF, colon CA, thyroid CA. Transferred to higher level of care on 2/20 for neostigmine administration, now with new PT/OT orders.   OT comments  Pt seen for OT tx this date, to f/u re: safety with ADLs/ADL mobility. New order placed as pt had transferred to higher level of care for medication administration, but no actual change in pt status, so treatment versus re-evaluation completed. He declines to get OOB beyond sup to/from long sitting with MIN A citing abdominal pain. OT educates pt re: bed level therex he can do to prevent atrophy while in the hospital and not tolerating OOB activity well (see below). OT has pt go through the exercises and provides visual demonstration as well as verbal cues for pace and form. He tolerates session well, but is limited with participation d/t abdominal discomfort. RN reports he has procedure this afternoon to address. Will continue to follow acutely and recommend that OT w/in pt's facility f/u upon d/c from acute setting to ensure strength and safety are appropriate to return to baseline function.   Recommendations for follow up therapy are one component of a multi-disciplinary discharge planning process, led by the attending physician.  Recommendations may be updated based on patient status, additional functional criteria and insurance authorization.    Follow Up Recommendations  Skilled nursing-short term rehab (<3 hours/day)    Assistance Recommended at Discharge Frequent or constant  Supervision/Assistance  Patient can return home with the following  A lot of help with walking and/or transfers;A lot of help with bathing/dressing/bathroom;Direct supervision/assist for medications management   Equipment Recommendations  Other (comment) (defer to next venue of care)    Recommendations for Other Services      Precautions / Restrictions Precautions Precautions: Fall Precaution Comments: HOB > 30 degrees Restrictions Weight Bearing Restrictions: No       Mobility Bed Mobility Overal bed mobility: Needs Assistance             General bed mobility comments: MIN A for sup to/from  long sitting, declines further mobilization citing abdominal pain    Transfers                   General transfer comment: pt declines     Balance                                           ADL either performed or assessed with clinical judgement   ADL Overall ADL's : Needs assistance/impaired Eating/Feeding: Set up   Grooming: Set up   Upper Body Bathing: Minimal assistance   Lower Body Bathing: Moderate assistance   Upper Body Dressing : Minimal assistance   Lower Body Dressing: Maximal assistance Lower Body Dressing Details (indicate cue type and reason): based on clinical observation, bed level                    Extremity/Trunk Assessment Upper Extremity Assessment Upper Extremity Assessment: Generalized  weakness;LUE deficits/detail LUE Deficits / Details: L side weaker at baseline d/t CVA, Grip MMT grossly 4-/5   Lower Extremity Assessment Lower Extremity Assessment: Generalized weakness;LLE deficits/detail LLE Deficits / Details: L LE weaker at baseline d/t h/o CVA, DF/PF grossly 4-/5        Vision Patient Visual Report: No change from baseline     Perception     Praxis      Cognition Arousal/Alertness: Awake/alert Behavior During Therapy: Flat affect Overall Cognitive Status: Within Functional Limits for tasks  assessed                                 General Comments: Pt is oriented, some conflicting PLOF reports however. Requires increased processing time, able to follow commands, generally flat. Particular about door/curtain.        Exercises Other Exercises Other Exercises: OT ed re bed level therex he can perform to prevent atrophy including modfied bicep pull up using bed rails adn body weight for resistance, modified bed level tricep push up, and L hand grip squeezes to improve grip strrength    Shoulder Instructions       General Comments      Pertinent Vitals/ Pain       Pain Assessment Pain Assessment: Faces Faces Pain Scale: Hurts even more Pain Location: pain in abdomen with attempts to lightly mobilize. 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: A Lot Help from another person toileting, which includes using toliet, bedpan, or urinal?: A Lot Help from another person bathing (including washing, rinsing, drying)?: A Lot Help from another person to  put on and taking off regular upper body clothing?: A Little Help from another person to put on and taking off regular lower body clothing?: A Lot 6 Click Score: 14    End of Session    OT Visit Diagnosis: Unsteadiness on feet (R26.81);Muscle weakness (generalized) (M62.81)   Activity Tolerance Patient limited by pain   Patient Left in bed;with call bell/phone within reach   Nurse Communication Mobility status        Time: 1135-1145 OT Time Calculation (min): 10 min  Charges: OT General Charges $OT Visit: 1 Visit OT Treatments $Therapeutic Exercise: 8-22 mins  Gerrianne Scale, MS, OTR/L ascom [phone] 10/26/21, 3:59 PM

## 2021-10-26 NOTE — Progress Notes (Signed)
PT Cancellation Note  Patient Details Name: Calvin [name] MRN: [id number] DOB: Jul 19, 1951   Cancelled Treatment:    Reason Eval/Treat Not Completed: Patient out of room at procedure and unavailable, will attempt to see pt at a future date/time as medically appropriate.    Linus Salmons PT, DPT 10/26/21, 3:14 PM

## 2021-10-26 NOTE — Progress Notes (Signed)
PHARMACY - TOTAL PARENTERAL NUTRITION CONSULT NOTE   Indication: Ogilivie Syndrome   Patient Measurements: Weight: 96.2 kg (212 lb 1.3 oz)   Body mass index is 29.17 kg/m.  Assessment: 71 y.o. male with medical history significant of Ogilvie's syndrome, ileus, hypertension, hyperlipidemia, COPD, MI, stroke with mild left-sided weakness, GERD, gout, depression with anxiety, seizure, DDD, CAD, MI,  alcohol abuse, CKD stage IIIa, dCHF, colon cancer and thyroid cancer who is admitted with colonic ileus secondary to Ogilvie syndrome.  Glucose / Insulin: on no insulin prior to admission (non-diabetic) Electrolytes: Within normal limits Renal: SCr~1, stable Hepatic: CMP in am Intake / Output; MIVF: no MIVF GI Imaging: 2/21 Abd x-ray: Colonic gas dilatation is still seen, maximum up to 10 cm in the transverse segment, but with improvement with previous maximum diameter 12.1 cm. GI Surgeries / Procedures: no recent  Central access: 10/25/21 TPN start date: 10/25/21  Nutritional Goals: Goal TPN rate is 80 mL/hr (provides 110 g of protein and 2102 kcals per day)  RD Assessment: Estimated Needs Total Energy Estimated Needs: 2100-2400kcal/day Total Protein Estimated Needs: 105-120g/day Total Fluid Estimated Needs: 2.0-2.3L/day  Current Nutrition:  NPO  Plan:  Advance TPN to 80 mL/hr at 1800 (total volume including overfill 1920 mL) Nutritional components: Amino acids (using Clinisol 15%): 110 grams Dextrose: 303 grams Lipids (using 20% SMOFlipids): 63 grams kCal: 2101 / 24h Electrolytes in TPN (standard): Na 34mEq/L, K 48mEq/L, Ca 59mEq/L, Mg 36mEq/L, and Phos 15mmol/L. Cl:Ac 1:1 Add standard MVI (MWF only d/t national b/o status) and trace elements to TPN Continue Sensitive q6h SSI and adjust as needed. Not requiring so far, may be able to discontinue in 1-2 days after tolerating full rate TPN Monitor TPN labs on Mon/Thurs, daily until stable  Benita Gutter 10/26/2021,7:34 AM

## 2021-10-26 NOTE — Op Note (Signed)
[facility] [name] Procedure Date: 10/26/2021 3:25 PM MRN: [id number] Account #: [id number] Date of Birth: 07-27-1951 Admit Type: Inpatient Age: 71 Room: [facility] ENDO ROOM 2 Gender: Male Note Status: Finalized Instrument Name: Park Meo [id number] Procedure:             Colonoscopy Indications:           Ogilvie's syndrome Providers:             Andrey Farmer MD, MD Medicines:             Monitored Anesthesia Care Complications:         No immediate complications. Procedure:             Pre-Anesthesia Assessment:                        - Prior to the procedure, a History and Physical was                         performed, and patient medications and allergies were                         reviewed. The patient is competent. The risks and                         benefits of the procedure and the sedation options and                         risks were discussed with the patient. All questions                         were answered and informed consent was obtained.                         Patient identification and proposed procedure were                         verified by the physician, the nurse, the                         anesthesiologist, the anesthetist and the technician                         in the endoscopy suite. Mental Status Examination:                         alert and oriented. Airway Examination: normal                         oropharyngeal airway and neck mobility. Respiratory                         Examination: clear to auscultation. CV Examination:                         normal. Prophylactic Antibiotics: The patient does not                         require prophylactic antibiotics. Prior  Anticoagulants: The patient has taken no previous                         anticoagulant or antiplatelet agents. ASA Grade                         Assessment: III - A patient with severe systemic                          disease. After reviewing the risks and benefits, the                         patient was deemed in satisfactory condition to                         undergo the procedure. The anesthesia plan was to use                         monitored anesthesia care (MAC). Immediately prior to                         administration of medications, the patient was                         re-assessed for adequacy to receive sedatives. The                         heart rate, respiratory rate, oxygen saturations,                         blood pressure, adequacy of pulmonary ventilation, and                         response to care were monitored throughout the                         procedure. The physical status of the patient was                         re-assessed after the procedure.                        After obtaining informed consent, the colonoscope was                         passed under direct vision. Throughout the procedure,                         the patient's blood pressure, pulse, and oxygen                         saturations were monitored continuously. The                         Colonoscope was introduced through the anus and                         advanced to the the ileocecal valve. The colonoscopy  was performed without difficulty. The patient                         tolerated the procedure well. The quality of the bowel                         preparation was poor. Findings:      The perianal and digital rectal examinations were normal.      The lumen of the colon (entire examined portion) was significantly       dilated. Air was aspirated      Colonic decompression tube was attempted but unable to adequately remove       wire due to friction with small bore catheter.      A few sessile, non-bleeding polyps were found in the entire colon.       Polypectomy was not attempted due to focusing on colonic decompression.       Size ranged from  a few mm to over a centimeter. Impression:            - Preparation of the colon was poor.                        - Dilated in the entire examined colon.                        - A few, non-bleeding polyps in the entire colon.                         Resection not attempted. Can consider outpatient                         colonoscopy if benefits outweigh risks.                        - No specimens collected. Recommendation:        - Return patient to hospital ward for ongoing care.                        - Clear liquid diet.                        - Continue present medications.                        - Perform a flat plate abdominal x-ray today. Procedure Code(s):     --- Professional ---                        [id number], Colonoscopy, flexible; diagnostic, including                         collection of specimen(s) by brushing or washing, when                         performed (separate procedure) Diagnosis Code(s):     --- Professional ---                        K59.39, Other megacolon  K63.5, Polyp of colon                        K59.81, Ogilvie syndrome CPT copyright 2019 American [employer] Association. All rights reserved. The codes documented in this report are preliminary and upon coder review may  be revised to meet current compliance requirements. Andrey Farmer MD, MD 10/26/2021 4:18:48 PM Number of Addenda: 0 Note Initiated On: 10/26/2021 3:25 PM Scope Withdrawal Time: 0 hours 10 minutes 27 seconds  Total Procedure Duration: 0 hours 22 minutes 19 seconds  Estimated Blood Loss:  Estimated blood loss: none.      [facility]

## 2021-10-26 NOTE — Anesthesia Preprocedure Evaluation (Addendum)
Anesthesia Evaluation  Patient identified by MRN, date of birth, ID band Patient awake    Reviewed: Allergy & Precautions, NPO status , Patient's Chart, lab work & pertinent test results  History of Anesthesia Complications Negative for: history of anesthetic complications  Airway Mallampati: III  TM Distance: >3 FB Neck ROM: full    Dental  (+) Chipped   Pulmonary COPD, Current Smoker and Patient abstained from smoking.,    Pulmonary exam normal        Cardiovascular hypertension, (-) angina+ CAD and + Past MI  Normal cardiovascular exam     Neuro/Psych Seizures -,  PSYCHIATRIC DISORDERS  Neuromuscular disease CVA, Residual Symptoms    GI/Hepatic negative GI ROS, Neg liver ROS,   Endo/Other  negative endocrine ROS  Renal/GU Renal disease  negative genitourinary   Musculoskeletal  (+) Arthritis ,   Abdominal   Peds  Hematology negative hematology ROS (+)   Anesthesia Other Findings Patient is NPO appropriate and reports no nausea or vomiting today.  Past Medical History: No date: Alcohol abuse     Comment:  drinks on weekend No date: Anemia No date: Anxiety No date: Arthritis No date: Cancer ([place])     Comment:  colon,throat No date: COPD (chronic obstructive pulmonary disease) (HCC) No date: Coronary artery disease No date: Depression No date: Gout No date: Hypertension No date: Myocardial infarction ([place]) No date: Neuromuscular disorder ([place]) No date: Seizures ([place])     Comment:  last 6 months ago No date: Stroke [facility])     Comment:  multiple  left side weakness No date: Tremors of nervous system  Past Surgical History: 10/19/2015: CARPAL TUNNEL RELEASE; Left     Comment:  Procedure: CARPAL TUNNEL RELEASE;  Surgeon: Hessie Knows, MD;  Location: [facility] ORS;  Service: Orthopedics;                Laterality: Left; No date: COLON SURGERY No date: JOINT REPLACEMENT     Comment:  left  partial hip  2013: THROAT SURGERY     Comment:  cancer  BMI    Body Mass Index: 28.08 kg/m      Reproductive/Obstetrics negative OB ROS                            Anesthesia Physical Anesthesia Plan  ASA: 3  Anesthesia Plan: General   Post-op Pain Management:    Induction: Intravenous  PONV Risk Score and Plan: Propofol infusion and TIVA  Airway Management Planned: Natural Airway and Nasal Cannula  Additional Equipment:   Intra-op Plan:   Post-operative Plan:   Informed Consent: I have reviewed the patients History and Physical, chart, labs and discussed the procedure including the risks, benefits and alternatives for the proposed anesthesia with the patient or authorized representative who has indicated his/her understanding and acceptance.     Dental Advisory Given  Plan Discussed with: Anesthesiologist, CRNA and Surgeon  Anesthesia Plan Comments: (Patient consented for risks of anesthesia including but not limited to:  - adverse reactions to medications - risk of airway placement if required - damage to eyes, teeth, lips or other oral mucosa - nerve damage due to positioning  - sore throat or hoarseness - Damage to heart, brain, nerves, lungs, other parts of body or loss of life  Patient voiced understanding.)        Anesthesia Quick  Evaluation

## 2021-10-26 NOTE — Care Plan (Signed)
Colonic decompression completed. Decompression tube unable to be placed but aspiration performed with significant improvement in abdominal distension.  - ok for clear liquids and advance as tolerated - abdominal xray ordered - PT/OT - daily miralax  Please call with any questions or concerns.  Raylene Miyamoto MD, MPH [place]

## 2021-10-27 ENCOUNTER — Encounter: Payer: Self-pay | Admitting: Gastroenterology

## 2021-10-27 DIAGNOSIS — R109 Unspecified abdominal pain: Secondary | ICD-10-CM | POA: Diagnosis not present

## 2021-10-27 LAB — GLUCOSE, CAPILLARY
Glucose-Capillary: 106 mg/dL — ABNORMAL HIGH (ref 70–99)
Glucose-Capillary: 132 mg/dL — ABNORMAL HIGH (ref 70–99)
Glucose-Capillary: 95 mg/dL (ref 70–99)
Glucose-Capillary: 106 mg/dL — ABNORMAL HIGH (ref 70–99)

## 2021-10-27 LAB — BASIC METABOLIC PANEL
Anion gap: 7 (ref 5–15)
BUN: 14 mg/dL (ref 8–23)
CO2: 26 mmol/L (ref 22–32)
Calcium: 9.2 mg/dL (ref 8.9–10.3)
Chloride: 107 mmol/L (ref 98–111)
Creatinine, Ser: 0.93 mg/dL (ref 0.61–1.24)
GFR, Estimated: >60 mL/min (ref >60)
Glucose, Bld: 114 mg/dL — ABNORMAL HIGH (ref 70–99)
Potassium: 5.2 mmol/L — ABNORMAL HIGH (ref 3.5–5.1)
Sodium: 140 mmol/L (ref 135–145)

## 2021-10-27 LAB — POTASSIUM
Potassium: 5.3 mmol/L — ABNORMAL HIGH (ref 3.5–5.1)
Potassium: 4.9 mmol/L (ref 3.5–5.1)

## 2021-10-27 LAB — MAGNESIUM: Magnesium: 1.9 mg/dL (ref 1.7–2.4)

## 2021-10-27 LAB — PHOSPHORUS: Phosphorus: 3.9 mg/dL (ref 2.5–4.6)

## 2021-10-27 MED ORDER — TRACE MINERALS CU-MN-SE-ZN 300-55-60-3000 MCG/ML IV SOLN
INTRAVENOUS | Status: DC
  Filled 2021-10-27: qty 733.47

## 2021-10-27 MED ORDER — TRACE MINERALS CU-MN-SE-ZN 300-55-60-3000 MCG/ML IV SOLN
INTRAVENOUS | Status: DC
  Filled 2021-10-27: qty 733.44

## 2021-10-27 MED ORDER — FUROSEMIDE 40 MG PO TABS
60.0000 mg | ORAL_TABLET | Freq: Every day | ORAL | Status: DC
  Administered 2021-10-27: 60 mg via ORAL
  Filled 2021-10-27: qty 1
  Filled 2021-10-27: qty 3

## 2021-10-27 NOTE — Progress Notes (Signed)
PROGRESS NOTE    Calvin [name]  GHW:[id number] DOB: 04-09-1951 DOA: 10/16/2021 PCP: Pcp, No    Brief Narrative:  71 y.o. male with medical history significant of Ogilvie's syndrome, ileus, hypertension, hyperlipidemia, COPD, stroke with mild left-sided weakness, GERD, gout, depression with anxiety, seizure, CAD, MI,  alcohol abuse, CKD stage IIIa, dCHF, colon cancer, thyroid cancer, who presents with nausea vomiting, abdominal pain.    Patient is a poor historian, history is limited. Pt states that has been having abdominal pain for 2 days, associated with nausea, nonbilious nonbloody vomiting several times.  He also has abdominal distention.  Per his daughter (I called her daughter by phone), she noticed patient's abdominal distention several days ago.  Patient states that he has diarrhea, but per nurse observation, patient's stools are formed in the ED.  Patient does not have chest pain, cough, shortness of breath.  Patient has fever 100.4 in ED. patient continues to have ileus.  GI and general surgery following.Due to persistent ileus/pseudoobstruction, neostigmine was administered 2/20 with decent response.  Repeat dose again on 2/21 with poor response.  TPN initiated 2/21  GI performed decompressive therapy via colonoscope on 2/22.  Good result with immediate improvement in abdominal distention.  Patient more comfortable appearing on 2/23.  Neostigmine discontinued.  Discussed with GI.  Transfer back to medical floor   Assessment & Plan:   Principal Problem:   Abdominal pain Active Problems:   Seizures (HCC)   HTN (hypertension)   CAD (coronary artery disease)   COPD (chronic obstructive pulmonary disease) (HCC)   Depression with anxiety   Ogilvie's syndrome   UTI (urinary tract infection)   HLD (hyperlipidemia)   Stroke (HCC)   Sepsis (HCC)   Chronic kidney disease, stage 3a (HCC)   Tobacco abuse   Abdominal distention  Abdominal pain secondary to partial small bowel  obstruction/ileus -Concern due to Ogilivie Syndrome from Narcotic use.  Persistent abdominal distention.  Repeat scans does not show any obvious obstruction.  Had good response to neostigmine on 2/20.  He had poor response to neostigmine therefore TPN was initiated. -GI and general surgery following. -Surgery signed off as no evidence of mechanical obstruction -Status post decompressive therapy via colonoscope on 2/22, good result Plan: Full liquid diet Advance as tolerated to soft Out of bed to chair, daily therapy Daily MiraLAX   History of Ogilvie syndrome Daily MiraLAX as above   Possible UTI, felt unlikely Sepsis ruled out -Ucx grew Enteroccocus, On Ampicillin.  Complete 7-day course   CAD (coronary artery disease), no chest pain. -Continue aspirin and statin   COPD (chronic obstructive pulmonary disease) ([place]): Stable -Bronchodilators as needed   Depression with anxiety -Continue home medications when able to tolerate.  Home PO meds - Zoloft and Xanax.    HLD (hyperlipidemia) -Continue statin   HTN (hypertension) -Currently home medications are on hold.  IV hydralazine and Lopressor as needed   Seizure -Seizure precautions.  Currently on IV Keppra   History of Stroke ([place]) -ASA and zocor   Chronic kidney disease, stage 3a ([place]): stable -f/u by BMP   Tobacco abuse: -Nicotine patch  Possible cognitive decline Patient with poor insight into his disease process    DVT prophylaxis: SQ heparin Code Status: Full Family Communication: None today attempted to call daughter [name] [phone].  Phone off, voicemail box full Disposition Plan: Status is: Inpatient Remains inpatient appropriate because: Ogilvie syndrome on TPN.    Level of care: Med-Surg  Consultants:  GI  Procedures:  Colonoscopy 2/22  Antimicrobials: None   Subjective: Seen and examined.  Feeling better this AM  Objective: Vitals:   10/27/21 0600 10/27/21 0700 10/27/21  0800 10/27/21 0900  BP: 129/81 132/74 (!) 153/88 139/72  Pulse: 75 67 73 63  Resp: 13 15 18 16   Temp:   97.7 F (36.5 C)   TempSrc:   Axillary   SpO2: 100% 99% 100% 97%  Weight:      Height:        Intake/Output Summary (Last 24 hours) at 10/27/2021 1414 Last data filed at 10/27/2021 1100 Gross per 24 hour  Intake 2766.45 ml  Output 3650 ml  Net -883.55 ml   Filed Weights   10/26/21 0500 10/26/21 1503 10/27/21 0500  Weight: 96.2 kg 92.6 kg 94.6 kg    Examination:  General exam: Appears calm and comfortable  Respiratory system: Clear to auscultation. Respiratory effort normal. Cardiovascular system: S1-S2 RRR, no murmurs, no pedal edema Gastrointestinal system: Mild distention, nontender, normal bowel sounds Central nervous system: Alert and oriented. No focal neurological deficits. Extremities: Symmetric 5 x 5 power. Skin: No rashes, lesions or ulcers Psychiatry: Judgement and insight appear impaired. Mood & affect flattened.     Data Reviewed: I have personally reviewed following labs and imaging studies  CBC: Recent Labs  Lab 10/22/21 0300 10/23/21 0451 10/24/21 0425 10/25/21 0448 10/26/21 0330  WBC 3.4* 3.3* 4.8 5.0 4.7  HGB 11.3* 12.3* 11.4* 12.3* 11.8*  HCT 34.8* 38.6* 35.3* 39.0 36.4*  MCV 93.3 93.5 93.6 95.4 93.6  PLT 165 181 161 181 323   Basic Metabolic Panel: Recent Labs  Lab 10/23/21 0451 10/24/21 0425 10/25/21 0448 10/26/21 0330 10/27/21 0558 10/27/21 0754  NA 141 142 140 142 140  --   K 3.7 3.4* 4.0 4.4 5.2* 5.3*  CL 112* 112* 110 109 107  --   CO2 21* 24 24 27 26   --   GLUCOSE 105* 79 91 108* 114*  --   BUN 8 7* 7* 8 14  --   CREATININE 0.92 0.98 1.06 0.96 0.93  --   CALCIUM 8.7* 8.3* 8.5* 9.0 9.2  --   MG 1.7 1.6* 2.1 1.7 1.9  --   PHOS  --   --  3.6 4.0 3.9  --    GFR: Estimated Creatinine Clearance: 85 mL/min (by C-G formula based on SCr of 0.93 mg/dL). Liver Function Tests: Recent Labs  Lab 10/26/21 0330  AST 19  ALT 22   ALKPHOS 47  BILITOT 0.3  PROT 6.1*  ALBUMIN 3.1*   No results for input(s): LIPASE, AMYLASE in the last 168 hours. No results for input(s): AMMONIA in the last 168 hours. Coagulation Profile: No results for input(s): INR, PROTIME in the last 168 hours. Cardiac Enzymes: No results for input(s): CKTOTAL, CKMB, CKMBINDEX, TROPONINI in the last 168 hours. BNP (last 3 results) No results for input(s): PROBNP in the last 8760 hours. HbA1C: No results for input(s): HGBA1C in the last 72 hours. CBG: Recent Labs  Lab 10/26/21 1228 10/26/21 1713 10/26/21 2352 10/27/21 0602 10/27/21 1139  GLUCAP 108* 104* 132* 106* 95   Lipid Profile: Recent Labs    10/26/21 0330  TRIG 85   Thyroid Function Tests: No results for input(s): TSH, T4TOTAL, FREET4, T3FREE, THYROIDAB in the last 72 hours. Anemia Panel: No results for input(s): VITAMINB12, FOLATE, FERRITIN, TIBC, IRON, RETICCTPCT in the last 72 hours. Sepsis Labs: No results for input(s): PROCALCITON, LATICACIDVEN in the last 168  hours.  No results found for this or any previous visit (from the past 240 hour(s)).        Radiology Studies: DG Abd 1 View  Result Date: 10/26/2021 CLINICAL DATA:  Abdominal pain EXAM: ABDOMEN - 1 VIEW COMPARISON:  Previous studies including the examination done earlier today FINDINGS: Bowel gas pattern is nonspecific. There is interval resolution of gaseous distention of colon. Small to moderate amount of stool is seen in right colon. There is no fecal impaction in the rectosigmoid. There is previous left hip arthroplasty. IMPRESSION: Nonspecific bowel gas pattern. There is interval resolution of gaseous distention of colon. Electronically Signed   By: Elmer Picker M.D.   On: 10/26/2021 17:34   DG Abd 1 View  Result Date: 10/26/2021 CLINICAL DATA:  Follow-up of ileus EXAM: ABDOMEN - 1 VIEW COMPARISON:  Yesterday FINDINGS: Two supine views of the abdomen and pelvis. Right hemidiaphragm elevation.  Cardiomegaly. Gas filled colon, with the index transverse colonic diameter of 12.8 cm versus 10.3 cm on the prior. Descending/sigmoid colon at 8.8 cm, similar. No free intraperitoneal air or gaseous distension of small bowel loops. Left hip arthroplasty. IMPRESSION: Slight increase in gaseous distension of the colon, suggesting colonic ileus. Electronically Signed   By: Abigail Miyamoto M.D.   On: 10/26/2021 09:15        Scheduled Meds:  ALPRAZolam  0.5 mg Oral BID   aspirin  81 mg Oral Daily   B-complex with vitamin C  1 tablet Oral Daily   calcium-vitamin D  1 tablet Oral BID   chewing gum (ORBIT) sugar free  1 Stick Oral TID   Chlorhexidine Gluconate Cloth  6 each Topical Daily   feeding supplement  237 mL Oral TID BM   furosemide  60 mg Oral Daily   gabapentin  800 mg Oral TID   heparin  5,000 Units Subcutaneous Q8H   insulin aspart  0-9 Units Subcutaneous Q6H   lisinopril  5 mg Oral Daily   multivitamin with minerals  1 tablet Oral Daily   nicotine  21 mg Transdermal Daily   nystatin   Topical BID   pantoprazole (PROTONIX) IV  40 mg Intravenous Q24H   polyethylene glycol  17 g Oral Daily   sertraline  50 mg Oral Daily   sodium chloride flush  10-40 mL Intracatheter Q12H   tamsulosin  0.4 mg Oral QPM   tiotropium  18 mcg Inhalation Daily   Continuous Infusions:  sodium chloride 10 mL/hr at 10/27/21 1100   levETIRAcetam 400 mL/hr at 10/27/21 1100   TPN ADULT (ION) 80 mL/hr at 10/27/21 1100   TPN ADULT (ION)       LOS: 11 days       Sidney Ace, MD [facility]   If 7PM-7AM, please contact night-coverage  10/27/2021, 2:14 PM

## 2021-10-27 NOTE — Anesthesia Postprocedure Evaluation (Signed)
Anesthesia Post Note  Patient: Calvin [name]  Procedure(s) Performed: COLONOSCOPY WITH PROPOFOL  Patient location during evaluation: Endoscopy Anesthesia Type: General Level of consciousness: awake and alert Pain management: pain level controlled Vital Signs Assessment: post-procedure vital signs reviewed and stable Respiratory status: spontaneous breathing, nonlabored ventilation, respiratory function stable and patient connected to nasal cannula oxygen Cardiovascular status: blood pressure returned to baseline and stable Postop Assessment: no apparent nausea or vomiting Anesthetic complications: no   No notable events documented.   Last Vitals:  Vitals:   10/27/21 0500 10/27/21 0600  BP: 130/75 129/81  Pulse: 72 75  Resp: (!) 22 13  Temp:    SpO2: 97% 100%    Last Pain:  Vitals:   10/27/21 0400  TempSrc: Oral  PainSc:                  Precious Haws Kodey Xue

## 2021-10-27 NOTE — Evaluation (Signed)
Physical Therapy Evaluation Patient Details Name: Calvin [name] MRN: [id number] DOB: 01/12/1951 Today's Date: 10/27/2021  History of Present Illness  Pt is a 71 y/o M admitted on 10/16/21 after presenting with c/c of N&V & abdominal pain. Pt is being treated for abdominal pain 2/2 partial SBO/ileus.   PMH: Ogilvie's syndrome, ileus, HTN, HLD, COPD, stroke with mild L sided weakness, GERD, gout, depression with anxiety, seizure, CAD, MI, alcohol abuse, CKD stage 3a, dCHF, colon CA, thyroid CA. Transferred to higher level of care on 2/20 for neostigmine administration, now with new PT/OT orders.  Clinical Impression  Supine in bed upon arrival to session; alert and oriented to basic information, follows commands.  Slightly argumentative at times, but easily distractible and redirectable.  Denies pain and feels abdomen is "softer today".  Baseline hemiparesis noted throughout L hemi-body; unchanged with this admission.  Currently able to complete bed mobility with min/mod assist; sit/stand, bed/chair transfers and gait (7') with RW, min/mod assist.  Demonstrates excessive weight shift to R LE throughout gait cycle; limited L hip/knee flexion, limited L heel strike/toe off.  Slow and deliberate, min assist for balance/safety. Would benefit from skilled PT to address above deficits and promote optimal return to PLOF.; recommend transition to STR upon discharge from acute hospitalization.        Recommendations for follow up therapy are one component of a multi-disciplinary discharge planning process, led by the attending physician.  Recommendations may be updated based on patient status, additional functional criteria and insurance authorization.  Follow Up Recommendations Skilled nursing-short term rehab (<3 hours/day)    Assistance Recommended at Discharge Frequent or constant Supervision/Assistance  Patient can return home with the following  A little help with walking and/or transfers;A little  help with bathing/dressing/bathroom    Equipment Recommendations    Recommendations for Other Services       Functional Status Assessment Patient has had a recent decline in their functional status and demonstrates the ability to make significant improvements in function in a reasonable and predictable amount of time.     Precautions / Restrictions Precautions Precautions: Fall Restrictions Weight Bearing Restrictions: No      Mobility  Bed Mobility Overal bed mobility: Needs Assistance Bed Mobility: Supine to Sit     Supine to sit: Min assist, Mod assist     General bed mobility comments: transition towards L side of bed; slow and effortful, heavy use of R hemi-body to complete movement    Transfers Overall transfer level: Needs assistance Equipment used: Rolling walker (2 wheels) Transfers: Sit to/from Stand Sit to Stand: Min assist, Mod assist           General transfer comment: cuing for hand placement    Ambulation/Gait Ambulation/Gait assistance: Min assist Gait Distance (Feet): 50 Feet Assistive device: Rolling walker (2 wheels)         General Gait Details: excessive weight shift to R LE throughout gait cycle; limited L hip/knee flexion, limited L heel strike/toe off.  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       Hand Dominance   Dominant Hand: Right    Extremity/Trunk Assessment   Upper Extremity Assessment Upper Extremity Assessment:  (R UE grossly WFL; L UE grossly 3- to 4/5 (baseline hemiparesis))    Lower Extremity Assessment Lower Extremity Assessment: Generalized weakness (R LE grossly 4-/5, L LE grossly 3-/5 throughout (baseline hemiparesis))       Communication   Communication: No difficulties  Cognition Arousal/Alertness: Awake/alert Behavior During Therapy: Flat affect Overall Cognitive Status: Within Functional Limits for tasks assessed                                 General Comments: Oriented to basic information, follows commands.  Slightly argmentative at times, but easily distractible and redirectible        General Comments      Exercises Other Exercises Other Exercises: Reviewed role of PT and progressive mobility, importance of OOB and mobility efforts; patient voiced understanding. Other Exercises: Bed/chair transfer with RW, min assist Other Exercises: Patient with meal tray at bedside, untouched, upon arrival to room.  Assisted with pouring soups into cup (patient unable to negotiate bowl/spoon), indep negotiates with R UE once set up.  Able to spoon pudding from cup when cup stabilized by therapist.  Phone call to dining services to place dinner order; did request all liquids/soups be sent in styrofoam cup on future meal trays for ease/indep with consumption.   Assessment/Plan    PT Assessment Patient needs continued PT services  PT Problem List Decreased  strength;Decreased mobility;Decreased safety awareness;Decreased activity tolerance;Decreased balance;Cardiopulmonary status limiting activity       PT Treatment Interventions DME instruction;Therapeutic exercise;Gait training;Balance training;Neuromuscular re-education;Functional mobility training;Patient/family education;Therapeutic activities;Modalities;Manual techniques    PT Goals (Current goals can be found in the Care Plan section)  Acute Rehab PT Goals Patient Stated Goal: to see if I can still walk PT Goal Formulation: With patient Time For Goal Achievement: 11/10/21 Potential to Achieve Goals: Good    Frequency Min 2X/week     Co-evaluation               AM-PAC PT "6 Clicks" Mobility  Outcome Measure Help needed turning from your back to your side while in a flat bed without using bedrails?: A Little Help needed moving from lying on your back to sitting on the side of a flat bed without using bedrails?: A Lot Help needed moving to and from a bed to a chair (including a wheelchair)?: A Little Help needed standing up from a chair using your arms (e.g., wheelchair or bedside chair)?: A Little Help needed to walk in hospital room?: A Little Help needed climbing 3-5 steps with a railing? : A Lot 6 Click Score: 16  End of Session   Activity Tolerance: Patient tolerated treatment well Patient left: in chair;with chair alarm set;with call bell/phone within reach Nurse Communication: Mobility status PT Visit Diagnosis: Muscle weakness (generalized) (M62.81);Unsteadiness on feet (R26.81)    Time: 9136-8599 PT Time Calculation (min) (ACUTE ONLY): 55 min   Charges:   PT Evaluation $PT Re-evaluation: 1 Re-eval PT Treatments $Therapeutic Activity: 23-37 mins       Rena Hunke H. Owens Shark, PT, DPT, NCS 10/27/21, 2:39 PM [phone]

## 2021-10-27 NOTE — Progress Notes (Signed)
PHARMACY - TOTAL PARENTERAL NUTRITION CONSULT NOTE   Indication: Ogilivie Syndrome   Patient Measurements: Height: 5' 11.5" (181.6 cm) Weight: 94.6 kg (208 lb 8.9 oz) IBW/kg (Calculated) : 76.45 TPN AdjBW (KG): 92.6 Body mass index is 28.68 kg/m.  Assessment: 71 y.o. male with medical history significant of Ogilvie's syndrome, ileus, hypertension, hyperlipidemia, COPD, MI, stroke with mild left-sided weakness, GERD, gout, depression with anxiety, seizure, DDD, CAD, MI,  alcohol abuse, CKD stage IIIa, dCHF, colon cancer and thyroid cancer who is admitted with colonic ileus secondary to Ogilvie syndrome.  Glucose / Insulin: on no insulin prior to admission (non-diabetic) Electrolytes: Mild hyperkalemia Renal: SCr~1, stable Hepatic: Within normal limits Intake / Output; MIVF: Net negative 4L for the admission. No MIVF GI Imaging: 2/21 Abd x-ray: Colonic gas dilatation is still seen, maximum up to 10 cm in the transverse segment, but with improvement with previous maximum diameter 12.1 cm. GI Surgeries / Procedures: no recent  Central access: 10/25/21 TPN start date: 10/25/21  Nutritional Goals: Goal TPN rate is 80 mL/hr (provides 110 g of protein and 2102 kcals per day)  RD Assessment: Estimated Needs Total Energy Estimated Needs: 2100-2400kcal/day Total Protein Estimated Needs: 105-120g/day Total Fluid Estimated Needs: 2.0-2.3L/day  Current Nutrition:  NPO  Plan:  Continue TPN at 80 mL/hr at 1800 (total volume including overfill 1920 mL) Nutritional components: Amino acids (using Clinisol 15%): 110 grams Dextrose: 303 grams Lipids (using 20% SMOFlipids): 63 grams kCal: 2101 / 24h Electrolytes in TPN (standard): Na 7mEq/L, K 75mEq/L, Ca 82mEq/L, Mg 59mEq/L, and Phos 18mmol/L. Cl:Ac 1:1 Decrease potassium concentration in TPN given hyperkalemia Re-start home PO furosemide 60 mg daily Add standard MVI (MWF only d/t national b/o status) and trace elements to TPN Continue  Sensitive q6h SSI and adjust as needed. Not requiring so far, may be able to discontinue in 1-2 days after tolerating full rate TPN. Plan to discontinue this tomorrow Monitor TPN labs on Mon/Thurs, daily until stable  Benita Gutter 10/27/2021,8:41 AM

## 2021-10-27 NOTE — Progress Notes (Signed)
Occupational Therapy Treatment Patient Details Name: Calvin [name] MRN: [id number] DOB: 03-Jul-1951 Today's Date: 10/27/2021   History of present illness Pt is a 71 y/o M admitted on 10/16/21 after presenting with c/c of N&V & abdominal pain. Pt is being treated for abdominal pain 2/2 partial SBO/ileus.   PMH: Ogilvie's syndrome, ileus, HTN, HLD, COPD, stroke with mild L sided weakness, GERD, gout, depression with anxiety, seizure, CAD, MI, alcohol abuse, CKD stage 3a, dCHF, colon CA, thyroid CA. Transferred to higher level of care on 2/20 for neostigmine administration, now with new PT/OT orders.   OT comments  Pt seen for OT tx this date to f/u re: safety with ADLs/ADL mobility. Pt seated up in recliner when OT presents. OT engages pt in seated grooming tasks with SETUP. He is agreeable to standing and taking some steps to get back to bed. He requires MIN/MOD A to CTS with RW from recliner and demos F standing balance. He requires cues for safety and sequencing for taking small steps from recliner to EOB as well as cues to reach back and control descent. Pt not successful on first trial, but with gentle encouragement, participates in second trial and demos correct technique with MIN A. Pt requires MIN A to get B LE back to bed as well. Left in bed with all needs met and in reach at end of session. Will continue to follow.    Recommendations for follow up therapy are one component of a multi-disciplinary discharge planning process, led by the attending physician.  Recommendations may be updated based on patient status, additional functional criteria and insurance authorization.    Follow Up Recommendations  Skilled nursing-short term rehab (<3 hours/day)    Assistance Recommended at Discharge Frequent or constant Supervision/Assistance  Patient can return home with the following  A lot of help with walking and/or transfers;A lot of help with bathing/dressing/bathroom;Direct supervision/assist for  medications management   Equipment Recommendations  Other (comment) (defer)    Recommendations for Other Services      Precautions / Restrictions Precautions Precautions: Fall Precaution Comments: HOB > 30 degrees Restrictions Weight Bearing Restrictions: No       Mobility Bed Mobility Overal bed mobility: Needs Assistance         Sit to supine: Min assist   General bed mobility comments: MIN A to get LEs back to bed    Transfers Overall transfer level: Needs assistance Equipment used: Rolling walker (2 wheels) Transfers: Sit to/from Stand Sit to Stand: Min assist, Mod assist           General transfer comment: cuing for hand placement     Balance Overall balance assessment: Needs assistance Sitting-balance support: No upper extremity supported, Feet supported Sitting balance-Leahy Scale: Good     Standing balance support: Bilateral upper extremity supported Standing balance-Leahy Scale: Fair                             ADL either performed or assessed with clinical judgement   ADL Overall ADL's : Needs assistance/impaired     Grooming: Set up;Sitting                                      Extremity/Trunk Assessment Upper Extremity Assessment Upper Extremity Assessment:  (R UE grossly WFL; L UE grossly 3- to 4/5 (baseline hemiparesis))   Lower Extremity  Assessment Lower Extremity Assessment: Generalized weakness (R LE grossly 4-/5, L LE grossly 3-/5 throughout (baseline hemiparesis))        Vision       Perception     Praxis      Cognition Arousal/Alertness: Awake/alert Behavior During Therapy: Flat affect Overall Cognitive Status: Within Functional Limits for tasks assessed                                 General Comments: Oriented to basic information, follows commands.        Exercises Other Exercises Other Exercises: OT ed re: safe use of RW for ADL transfers including reaching back to  control descent for fall prevention and prevention of injury.    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 (4 wheels)   Additional Comments: Pt reports he lives at [facility]      Prior Functioning/Environment              Frequency  Min 2X/week        Progress Toward Goals  OT Goals(current goals can now be found in the care plan section)  Progress towards OT goals: Progressing toward goals  Acute Rehab OT Goals Patient Stated Goal: to get stronger and be able to keep walking OT Goal Formulation: With patient Time For Goal Achievement: 11/09/21 Potential to Achieve Goals: Good  Plan Discharge plan remains appropriate    Co-evaluation                 AM-PAC OT "6 Clicks" Daily Activity     Outcome Measure   Help from another person eating meals?: A Little Help from another person taking care of personal grooming?: A Little Help from another person toileting, which includes using toliet, bedpan, or urinal?: A Little Help from another person bathing (including washing, rinsing, drying)?: A Little Help from another person to put on and taking off regular upper body clothing?: A Little Help from another person to put on and taking off regular lower body clothing?: A Lot 6 Click Score: 17    End of Session Equipment Utilized During Treatment: Rolling walker (2 wheels)  OT Visit Diagnosis: Unsteadiness on feet (R26.81);Muscle weakness (generalized) (M62.81)   Activity Tolerance Patient limited by pain   Patient Left in bed;with call bell/phone within reach;with bed alarm set   Nurse Communication Mobility status        Time: 1436-1450 OT Time Calculation (min): 14 min  Charges: OT General  Charges $OT Visit: 1 Visit OT Treatments $Therapeutic Activity: 8-22 mins  Gerrianne Scale, [place], OTR/L ascom [phone] 10/27/21, 5:18 PM

## 2021-10-27 NOTE — Progress Notes (Signed)
Pt for transfer to [facility]. Report given to RN. Vitals stable. Pt and family updated with plan of care.

## 2021-10-27 NOTE — Progress Notes (Signed)
GI Inpatient Follow-up Note  Subjective:  Patient seen and doing much better. Abdomen is still soft and tolerating PO diet.  Scheduled Inpatient Medications:   ALPRAZolam  0.5 mg Oral BID   aspirin  81 mg Oral Daily   B-complex with vitamin C  1 tablet Oral Daily   calcium-vitamin D  1 tablet Oral BID   chewing gum (ORBIT) sugar free  1 Stick Oral TID   Chlorhexidine Gluconate Cloth  6 each Topical Daily   feeding supplement  237 mL Oral TID BM   furosemide  60 mg Oral Daily   gabapentin  800 mg Oral TID   heparin  5,000 Units Subcutaneous Q8H   insulin aspart  0-9 Units Subcutaneous Q6H   lisinopril  5 mg Oral Daily   multivitamin with minerals  1 tablet Oral Daily   nicotine  21 mg Transdermal Daily   nystatin   Topical BID   pantoprazole (PROTONIX) IV  40 mg Intravenous Q24H   polyethylene glycol  17 g Oral Daily   sertraline  50 mg Oral Daily   sodium chloride flush  10-40 mL Intracatheter Q12H   tamsulosin  0.4 mg Oral QPM   tiotropium  18 mcg Inhalation Daily    Continuous Inpatient Infusions:    sodium chloride 10 mL/hr at 10/27/21 1100   levETIRAcetam 1,000 mg (10/27/21 1443)   TPN ADULT (ION) 80 mL/hr at 10/27/21 1100   TPN ADULT (ION)      PRN Inpatient Medications:  sodium chloride, acetaminophen, albuterol, alum & mag hydroxide-simeth, dextromethorphan-guaiFENesin, diclofenac Sodium, hydrALAZINE, LORazepam, LORazepam, menthol-cetylpyridinium, metoprolol tartrate, nitroGLYCERIN, nystatin, ondansetron (ZOFRAN) IV, sodium chloride flush, traZODone  Review of Systems:  Review of Systems  Constitutional:  Negative for chills and fever.  Respiratory:  Negative for shortness of breath.   Cardiovascular:  Negative for chest pain.  Gastrointestinal:  Negative for abdominal pain, constipation, diarrhea, nausea and vomiting.  Musculoskeletal:  Positive for joint pain.  Skin:  Negative for itching and rash.  Neurological:  Negative for focal weakness.   Psychiatric/Behavioral:  Negative for substance abuse.   All other systems reviewed and are negative.    Physical Examination: BP 139/72    Pulse 63    Temp 97.7 F (36.5 C) (Axillary)    Resp 16    Ht 5' 11.5" (1.816 m)    Wt 94.6 kg    SpO2 97%    BMI 28.68 kg/m  Gen: NAD, alert and oriented x 4 HEENT: PEERLA, EOMI, Neck: supple, no JVD or thyromegaly Chest: No respiratory distress Abd: soft, non-tender, non-distended Ext: no edema, well perfused with 2+ pulses, Skin: no rash or lesions noted Lymph: no LAD  Data: Lab Results  Component Value Date   WBC 4.7 10/26/2021   HGB 11.8 (L) 10/26/2021   HCT 36.4 (L) 10/26/2021   MCV 93.6 10/26/2021   PLT 180 10/26/2021   Recent Labs  Lab 10/24/21 0425 10/25/21 0448 10/26/21 0330  HGB 11.4* 12.3* 11.8*   Lab Results  Component Value Date   NA 140 10/27/2021   K 5.3 (H) 10/27/2021   CL 107 10/27/2021   CO2 26 10/27/2021   BUN 14 10/27/2021   CREATININE 0.93 10/27/2021   Lab Results  Component Value Date   ALT 22 10/26/2021   AST 19 10/26/2021   ALKPHOS 47 10/26/2021   BILITOT 0.3 10/26/2021   No results for input(s): APTT, INR, PTT in the last 168 hours. Assessment/Plan: Mr. Camplin is a 71  y.o. gentleman with multiple medical problems including COPD, chronic abdominal pain, CVA's, and history of colon cancer s/p sigmoidectomy here with ileus/ogilvies due to chronic narcotics that has resolved after colonic decompression  Recommendations:  - advance diet as tolerated - anticipate if continues to tolerate diet that TPN can be discontinued, likely as early as tomorrow - continue PT/OT - can start converting medicines to PO form - avoid narcotics - will follow peripherally for now, please call with any questions or concerns. Can discuss risks/benefits of outpatient colonoscopy given polyps noted on decompressive colonoscopy  Raylene Miyamoto MD, MPH [place]

## 2021-10-28 DIAGNOSIS — R109 Unspecified abdominal pain: Secondary | ICD-10-CM | POA: Diagnosis not present

## 2021-10-28 LAB — BASIC METABOLIC PANEL
Anion gap: 9 (ref 5–15)
BUN: 27 mg/dL — ABNORMAL HIGH (ref 8–23)
CO2: 24 mmol/L (ref 22–32)
Calcium: 9.3 mg/dL (ref 8.9–10.3)
Chloride: 103 mmol/L (ref 98–111)
Creatinine, Ser: 1.46 mg/dL — ABNORMAL HIGH (ref 0.61–1.24)
GFR, Estimated: 51 mL/min — ABNORMAL LOW (ref >60)
Glucose, Bld: 122 mg/dL — ABNORMAL HIGH (ref 70–99)
Potassium: 4.8 mmol/L (ref 3.5–5.1)
Sodium: 136 mmol/L (ref 135–145)

## 2021-10-28 LAB — GLUCOSE, CAPILLARY
Glucose-Capillary: 118 mg/dL — ABNORMAL HIGH (ref 70–99)
Glucose-Capillary: 127 mg/dL — ABNORMAL HIGH (ref 70–99)
Glucose-Capillary: 134 mg/dL — ABNORMAL HIGH (ref 70–99)
Glucose-Capillary: 164 mg/dL — ABNORMAL HIGH (ref 70–99)
Glucose-Capillary: 90 mg/dL (ref 70–99)

## 2021-10-28 MED ORDER — LEVETIRACETAM 500 MG PO TABS
1000.0000 mg | ORAL_TABLET | Freq: Two times a day (BID) | ORAL | Status: DC
  Administered 2021-10-28 – 2021-11-02 (×10): 1000 mg via ORAL
  Filled 2021-10-28 (×10): qty 2

## 2021-10-28 MED ORDER — TRACE MINERALS CU-MN-SE-ZN 300-55-60-3000 MCG/ML IV SOLN
INTRAVENOUS | Status: AC
  Filled 2021-10-28: qty 366.72

## 2021-10-28 NOTE — Progress Notes (Addendum)
PHARMACY - TOTAL PARENTERAL NUTRITION CONSULT NOTE   Indication: Ogilivie Syndrome   Patient Measurements: Height: 5' 11.5" (181.6 cm) Weight: 94.6 kg (208 lb 8.9 oz) IBW/kg (Calculated) : 76.45 TPN AdjBW (KG): 92.6 Body mass index is 28.68 kg/m.  Assessment: 71 y.o. male with medical history significant of Ogilvie's syndrome, ileus, hypertension, hyperlipidemia, COPD, MI, stroke with mild left-sided weakness, GERD, gout, depression with anxiety, seizure, DDD, CAD, MI,  alcohol abuse, CKD stage IIIa, dCHF, colon cancer and thyroid cancer who is admitted with colonic ileus secondary to Ogilvie syndrome.  Glucose / Insulin: on no insulin prior to admission (non-diabetic) Electrolytes: Mild hyperkalemia, improving Renal: Scr 0.93>1.46 Hepatic: Within normal limits Intake / Output; MIVF: Net negative 4L for the admission. No MIVF GI Imaging: 2/21 Abd x-ray: Colonic gas dilatation is still seen, maximum up to 10 cm in the transverse segment, but with improvement with previous maximum diameter 12.1 cm. GI Surgeries / Procedures: no recent  Central access: 10/25/21 TPN start date: 10/25/21  Nutritional Goals: Goal TPN rate is 80 mL/hr (provides 110 g of protein and 2102 kcals per day)  RD Assessment: Estimated Needs Total Energy Estimated Needs: 2100-2400kcal/day Total Protein Estimated Needs: 105-120g/day Total Fluid Estimated Needs: 2.0-2.3L/day  Current Nutrition:  NPO  Plan:  Discussed with MD, planning to stop TPN 2/24. Per RD rec, decrease to half rate now (2/24 @1000 ) and continue until bag runs out. TPN rate changed to 40 mL/hr  Nutritional components: Amino acids (using Clinisol 15%): 110 grams Dextrose: 303 grams Lipids (using 20% SMOFlipids): 63 grams kCal: 2101 / 24h Electrolytes in TPN (standard): Na 53mEq/L, K 75mEq/L, Ca 61mEq/L, Mg 16mEq/L, and Phos 31mmol/L. Cl:Ac 1:1 Decreased potassium concentration in TPN given hyperkalemia Add standard MVI (MWF only d/t  national b/o status) and trace elements to TPN Continue Sensitive q6h SSI and adjust as needed. Required 2 units in past 24 hours. Monitor TPN labs on Mon/Thurs, daily until stable  Burkley Dech O Christian Treadway 10/28/2021,7:12 AM

## 2021-10-28 NOTE — Progress Notes (Signed)
Mobility Specialist - Progress Note    10/28/21 1200  Mobility  Activity Transferred from bed to chair;Dangled on edge of bed  Level of Assistance Minimal assist, patient does 75% or more  Assistive Device Standard walker  Distance Ambulated (ft) 5 ft  Activity Response Tolerated well  $Mobility charge 1 Mobility    Pt supine utilizing RA upon arrival. Pt refused ambulation- no reason specified but agreeable to B-C after encouragement. Pt sat EOB with MinA ----- noted weakness on Left side. Completed STS and B-C with MinA. Pt left with needs in reach and chair alarm set.  Merrily Brittle Mobility Specialist 10/28/21, 12:39 PM

## 2021-10-28 NOTE — Progress Notes (Signed)
Nutrition Follow-up  DOCUMENTATION CODES:   Not applicable  INTERVENTION:   Discontinue TPN   Continue Ensure Enlive po TID, each supplement provides 350 kcal and 20 grams of protein.  Continue MVI po daily   NUTRITION DIAGNOSIS:   Inadequate oral intake related to acute illness as evidenced by other (comment) (pt on NPO/clear liquid diet since admission).  GOAL:   Patient will meet greater than or equal to 90% of their needs -met with TPN  MONITOR:   PO intake, Supplement acceptance, Labs, Weight trends, Skin, I & O's  ASSESSMENT:   71 y.o. male with medical history significant of Ogilvie's syndrome, ileus, hypertension, hyperlipidemia, COPD, MI, stroke with mild left-sided weakness, GERD, gout, depression with anxiety, seizure, DDD, CAD, MI,  alcohol abuse, CKD stage IIIa, dCHF, colon cancer and thyroid cancer who is admitted with colonic ileus secondary to Ogilvie syndrome.  Pt s/p colonoscopy and decompression 2/22  Pt with improved abdominal distension and reporting that he is hungry today. Pt advanced to a soft diet yesterday. Pt with fairly good appetite and oral intake since admission. Pt is drinking Ensure supplements. Pt tolerating TPN well at goal rate. Will plan to discontinue TPN today after current bag runs out. Refeed labs wnl. Per chart, pt down ~7lbs from his admit weight and is down ~3lbs from his UBW. Pt - 3.3L on his I & Os.    Medications reviewed and include: aspirin, B-complex with C, lasix, heparin, insulin, MVI, nicotine, protonix, miralax  Labs reviewed: K 4.8 wnl P 3.9 wnl, Mg 1.9 wnl- 2/23 Cbgs- 90, 127 x 24 hrs  Diet Order:   Diet Order             DIET SOFT Room service appropriate? Yes; Fluid consistency: Thin  Diet effective now                  EDUCATION NEEDS:   No education needs have been identified at this time  Skin:  Skin Assessment: Reviewed RN Assessment (coccyx- small opening about 0.5 cm deep that appears to be  healed)  Last BM:  2/22- type 7  Height:   Ht Readings from Last 1 Encounters:  10/26/21 5' 11.5" (1.816 m)    Weight:   Wt Readings from Last 1 Encounters:  10/27/21 94.6 kg    Ideal Body Weight:  79.5 kg  BMI:  Body mass index is 28.68 kg/m.  Estimated Nutritional Needs:   Kcal:  2100-2400kcal/day  Protein:  105-120g/day  Fluid:  2.0-2.3L/day  Koleen Distance MS, RD, LDN Please refer to [facility] for RD and/or RD on-call/weekend/after hours pager

## 2021-10-28 NOTE — Plan of Care (Signed)
°  Problem: Clinical Measurements: Goal: Ability to maintain clinical measurements within normal limits will improve Outcome: Progressing Goal: Will remain free from infection Outcome: Progressing Goal: Diagnostic test results will improve Outcome: Progressing Goal: Respiratory complications will improve Outcome: Progressing Goal: Cardiovascular complication will be avoided Outcome: Progressing   Problem: Pain Managment: Goal: General experience of comfort will improve Outcome: Progressing  Pt is alert and orientedx4. Forgetful. V/S stable except Bp soft (93/52 map-65). Denies any pain or SOB.

## 2021-10-28 NOTE — Progress Notes (Signed)
PROGRESS NOTE    Calvin [name]  OAC:[id number] DOB: Mar 14, 1951 DOA: 10/16/2021 PCP: Pcp, No    Brief Narrative:  72 y.o. male with medical history significant of Ogilvie's syndrome, ileus, hypertension, hyperlipidemia, COPD, stroke with mild left-sided weakness, GERD, gout, depression with anxiety, seizure, CAD, MI,  alcohol abuse, CKD stage IIIa, dCHF, colon cancer, thyroid cancer, who presents with nausea vomiting, abdominal pain.    Patient is a poor historian, history is limited. Pt states that has been having abdominal pain for 2 days, associated with nausea, nonbilious nonbloody vomiting several times.  He also has abdominal distention.  Per his daughter (I called her daughter by phone), she noticed patient's abdominal distention several days ago.  Patient states that he has diarrhea, but per nurse observation, patient's stools are formed in the ED.  Patient does not have chest pain, cough, shortness of breath.  Patient has fever 100.4 in ED. patient continues to have ileus.  GI and general surgery following.Due to persistent ileus/pseudoobstruction, neostigmine was administered 2/20 with decent response.  Repeat dose again on 2/21 with poor response.  TPN initiated 2/21  GI performed decompressive therapy via colonoscope on 2/22.  Good result with immediate improvement in abdominal distention.  Patient more comfortable appearing on 2/23.  Neostigmine discontinued.  Discussed with GI.  Transfer back to medical floor  Abdominal distention and pain has improved.  Patient endorsing hunger    Assessment & Plan:   Principal Problem:   Abdominal pain Active Problems:   Seizures (HCC)   HTN (hypertension)   CAD (coronary artery disease)   COPD (chronic obstructive pulmonary disease) (HCC)   Depression with anxiety   Ogilvie's syndrome   UTI (urinary tract infection)   HLD (hyperlipidemia)   Stroke (HCC)   Sepsis (HCC)   Chronic kidney disease, stage 3a (HCC)   Tobacco abuse    Abdominal distention  Abdominal pain secondary to partial small bowel obstruction/ileus -Concern due to Ogilivie Syndrome from Narcotic use.  Persistent abdominal distention.  Repeat scans does not show any obvious obstruction.  Had good response to neostigmine on 2/20.  He had poor response to neostigmine therefore TPN was initiated. -GI and general surgery following. -Surgery signed off as no evidence of mechanical obstruction -Status post decompressive therapy via colonoscope on 2/22, good result Plan: Soft diet Out of bed to chair, daily therapy Daily MiraLAX Can likely discontinue TPN today   History of Ogilvie syndrome Daily MiraLAX as above   Possible UTI, felt unlikely Sepsis ruled out -Ucx grew Enteroccocus, On Ampicillin.  Complete 7-day course   CAD (coronary artery disease), no chest pain. -Continue aspirin and statin   COPD (chronic obstructive pulmonary disease) ([place]): Stable -Bronchodilators as needed   Depression with anxiety PTA Zoloft and Xanax   HLD (hyperlipidemia) -Continue statin   HTN (hypertension) -Currently home medications are on hold.  IV hydralazine and Lopressor as needed   Seizure -Seizure precautions.  Currently on IV Keppra   History of Stroke ([place]) -ASA and zocor   Chronic kidney disease, stage 3a ([place]): stable -f/u by BMP   Tobacco abuse: -Nicotine patch  Possible cognitive decline Patient with poor insight into his disease process    DVT prophylaxis: SQ heparin Code Status: Full Family Communication: None today attempted to call daughter [name] [phone].  Phone off, voicemail box full Disposition Plan: Status is: Inpatient Remains inpatient appropriate because: Ogilvie syndrome on TPN.    Level of care: Med-Surg  Consultants:  GI  Procedures:  Colonoscopy 2/22  Antimicrobials: None   Subjective: Seen and examined.  Feeling better this AM.  Abdominal distention improved  Objective: Vitals:    10/27/21 2006 10/28/21 0441 10/28/21 0602 10/28/21 0727  BP: (!) 146/80 (!) 93/56 (!) 93/52 94/62  Pulse: 88 86 73 88  Resp: 20 18 20 18   Temp: 98.2 F (36.8 C) 97.9 F (36.6 C)  98.6 F (37 C)  TempSrc: Oral Oral    SpO2: 100% 100% 96% 95%  Weight:      Height:        Intake/Output Summary (Last 24 hours) at 10/28/2021 1131 Last data filed at 10/28/2021 1039 Gross per 24 hour  Intake 1772 ml  Output 700 ml  Net 1072 ml   Filed Weights   10/26/21 0500 10/26/21 1503 10/27/21 0500  Weight: 96.2 kg 92.6 kg 94.6 kg    Examination:  General exam: Appears calm and comfortable  Respiratory system: Clear to auscultation. Respiratory effort normal. Cardiovascular system: S1-S2 RRR, no murmurs, no pedal edema Gastrointestinal system: Soft, nondistended, nontender, normal bowel sounds Central nervous system: Alert and oriented. No focal neurological deficits. Extremities: Symmetric 5 x 5 power. Skin: No rashes, lesions or ulcers Psychiatry: Judgement and insight appear impaired. Mood & affect flattened.     Data Reviewed: I have personally reviewed following labs and imaging studies  CBC: Recent Labs  Lab 10/22/21 0300 10/23/21 0451 10/24/21 0425 10/25/21 0448 10/26/21 0330  WBC 3.4* 3.3* 4.8 5.0 4.7  HGB 11.3* 12.3* 11.4* 12.3* 11.8*  HCT 34.8* 38.6* 35.3* 39.0 36.4*  MCV 93.3 93.5 93.6 95.4 93.6  PLT 165 181 161 181 956   Basic Metabolic Panel: Recent Labs  Lab 10/23/21 0451 10/24/21 0425 10/25/21 0448 10/26/21 0330 10/27/21 0558 10/27/21 0754 10/27/21 2139 10/28/21 0408  NA 141 142 140 142 140  --   --  136  K 3.7 3.4* 4.0 4.4 5.2* 5.3* 4.9 4.8  CL 112* 112* 110 109 107  --   --  103  CO2 21* 24 24 27 26   --   --  24  GLUCOSE 105* 79 91 108* 114*  --   --  122*  BUN 8 7* 7* 8 14  --   --  27*  CREATININE 0.92 0.98 1.06 0.96 0.93  --   --  1.46*  CALCIUM 8.7* 8.3* 8.5* 9.0 9.2  --   --  9.3  MG 1.7 1.6* 2.1 1.7 1.9  --   --   --   PHOS  --   --  3.6 4.0  3.9  --   --   --    GFR: Estimated Creatinine Clearance: 54.1 mL/min (A) (by C-G formula based on SCr of 1.46 mg/dL (H)). Liver Function Tests: Recent Labs  Lab 10/26/21 0330  AST 19  ALT 22  ALKPHOS 47  BILITOT 0.3  PROT 6.1*  ALBUMIN 3.1*   No results for input(s): LIPASE, AMYLASE in the last 168 hours. No results for input(s): AMMONIA in the last 168 hours. Coagulation Profile: No results for input(s): INR, PROTIME in the last 168 hours. Cardiac Enzymes: No results for input(s): CKTOTAL, CKMB, CKMBINDEX, TROPONINI in the last 168 hours. BNP (last 3 results) No results for input(s): PROBNP in the last 8760 hours. HbA1C: No results for input(s): HGBA1C in the last 72 hours. CBG: Recent Labs  Lab 10/27/21 0602 10/27/21 1139 10/27/21 1701 10/27/21 2358 10/28/21 0543  GLUCAP 106* 95 106* 134* 127*   Lipid Profile:  Recent Labs    10/26/21 0330  TRIG 85   Thyroid Function Tests: No results for input(s): TSH, T4TOTAL, FREET4, T3FREE, THYROIDAB in the last 72 hours. Anemia Panel: No results for input(s): VITAMINB12, FOLATE, FERRITIN, TIBC, IRON, RETICCTPCT in the last 72 hours. Sepsis Labs: No results for input(s): PROCALCITON, LATICACIDVEN in the last 168 hours.  No results found for this or any previous visit (from the past 240 hour(s)).        Radiology Studies: DG Abd 1 View  Result Date: 10/26/2021 CLINICAL DATA:  Abdominal pain EXAM: ABDOMEN - 1 VIEW COMPARISON:  Previous studies including the examination done earlier today FINDINGS: Bowel gas pattern is nonspecific. There is interval resolution of gaseous distention of colon. Small to moderate amount of stool is seen in right colon. There is no fecal impaction in the rectosigmoid. There is previous left hip arthroplasty. IMPRESSION: Nonspecific bowel gas pattern. There is interval resolution of gaseous distention of colon. Electronically Signed   By: Elmer Picker M.D.   On: 10/26/2021 17:34         Scheduled Meds:  ALPRAZolam  0.5 mg Oral BID   aspirin  81 mg Oral Daily   B-complex with vitamin C  1 tablet Oral Daily   calcium-vitamin D  1 tablet Oral BID   chewing gum (ORBIT) sugar free  1 Stick Oral TID   Chlorhexidine Gluconate Cloth  6 each Topical Daily   feeding supplement  237 mL Oral TID BM   furosemide  60 mg Oral Daily   gabapentin  800 mg Oral TID   heparin  5,000 Units Subcutaneous Q8H   insulin aspart  0-9 Units Subcutaneous Q6H   levETIRAcetam  1,000 mg Oral Q12H   lisinopril  5 mg Oral Daily   multivitamin with minerals  1 tablet Oral Daily   nicotine  21 mg Transdermal Daily   nystatin   Topical BID   pantoprazole (PROTONIX) IV  40 mg Intravenous Q24H   polyethylene glycol  17 g Oral Daily   sertraline  50 mg Oral Daily   sodium chloride flush  10-40 mL Intracatheter Q12H   tamsulosin  0.4 mg Oral QPM   tiotropium  18 mcg Inhalation Daily   Continuous Infusions:  sodium chloride 10 mL/hr at 10/28/21 1039   TPN ADULT (ION) 40 mL/hr at 10/28/21 1024     LOS: 12 days       Sidney Ace, MD [facility]   If 7PM-7AM, please contact night-coverage  10/28/2021, 11:31 AM

## 2021-10-28 NOTE — Progress Notes (Signed)
Mobility Specialist - Progress Note    10/28/21 1400  Mobility  Activity Transferred from chair to bed;Stood at bedside;Dangled on edge of bed  Level of Assistance Moderate assist, patient does 50-74%  Assistive Device Standard walker  Distance Ambulated (ft) 5 ft  Activity Response Tolerated well  $Mobility charge 1 Mobility    Pt in Chair using RA upon arrival. Pt completed STS with ModA and extra time needed. Pt needed MinA for bed mobility + extra time needed. Pt voiced no complaints. Pt left in bed with needs in reach and alarm set.  Merrily Brittle Mobility Specialist 10/28/21, 3:02 PM

## 2021-10-28 NOTE — TOC Progression Note (Signed)
Transition of Care [facility]) - Progression Note    Patient Details  Name: Calvin [name] MRN: [id number] Date of Birth: 10/18/1950  Transition of Care [facility] CM/SW Contact  Beverly Sessions, RN Phone Number: 10/28/2021, 9:52 AM  Clinical Narrative:    Per MD TPN to be stopped today Spoke with Sharyn Lull at [place].  No weekend admissions.  Earliest patient could return be Monday.   Patient is LTC.  Will need auth for STR prior to return.  Will also need a repeat covid test prior to discharge    Expected Discharge Plan: [place] Barriers to Discharge: Continued Medical Work up  Expected Discharge Plan and Services Expected Discharge Plan: [place] Acute Care Choice: Resumption of Svcs/PTA Provider Living arrangements for the past 2 months: [place]                                       Social Determinants of Health (SDOH) Interventions    Readmission Risk Interventions Readmission Risk Prevention Plan 10/17/2021 08/08/2019  Transportation Screening Complete Complete  PCP or Specialist Appt within 3-5 Days Complete -  [place]RI or [place] - Not Complete  HRI or Home Care Consult comments - not indicated from [place] for [place] Planning/Counseling Complete -  [place] Screening Not Applicable -  Medication Review [employer] photographer) Complete Complete  Some recent data might be hidden

## 2021-10-29 ENCOUNTER — Inpatient Hospital Stay: Payer: Medicare Other

## 2021-10-29 DIAGNOSIS — R109 Unspecified abdominal pain: Secondary | ICD-10-CM | POA: Diagnosis not present

## 2021-10-29 LAB — URINALYSIS, COMPLETE (UACMP) WITH MICROSCOPIC
Bacteria, UA: NONE SEEN
Bilirubin Urine: NEGATIVE
Glucose, UA: NEGATIVE mg/dL
Hgb urine dipstick: NEGATIVE
Ketones, ur: NEGATIVE mg/dL
Nitrite: NEGATIVE
Protein, ur: NEGATIVE mg/dL
Specific Gravity, Urine: 1.013 (ref 1.005–1.030)
pH: 5 (ref 5.0–8.0)

## 2021-10-29 LAB — BASIC METABOLIC PANEL
Anion gap: 9 (ref 5–15)
BUN: 46 mg/dL — ABNORMAL HIGH (ref 8–23)
CO2: 25 mmol/L (ref 22–32)
Calcium: 9.3 mg/dL (ref 8.9–10.3)
Chloride: 101 mmol/L (ref 98–111)
Creatinine, Ser: 1.85 mg/dL — ABNORMAL HIGH (ref 0.61–1.24)
GFR, Estimated: 38 mL/min — ABNORMAL LOW (ref >60)
Glucose, Bld: 115 mg/dL — ABNORMAL HIGH (ref 70–99)
Potassium: 5.2 mmol/L — ABNORMAL HIGH (ref 3.5–5.1)
Sodium: 135 mmol/L (ref 135–145)

## 2021-10-29 LAB — GLUCOSE, CAPILLARY
Glucose-Capillary: 99 mg/dL (ref 70–99)
Glucose-Capillary: 99 mg/dL (ref 70–99)
Glucose-Capillary: 112 mg/dL — ABNORMAL HIGH (ref 70–99)

## 2021-10-29 MED ORDER — SODIUM CHLORIDE 0.9 % IV SOLN: INTRAVENOUS | Status: AC

## 2021-10-29 MED ORDER — CHLORHEXIDINE GLUCONATE CLOTH 2 % EX PADS
6.0000 | MEDICATED_PAD | Freq: Every day | CUTANEOUS | Status: DC
  Administered 2021-10-29 – 2021-11-02 (×5): 6 via TOPICAL

## 2021-10-29 NOTE — Progress Notes (Signed)
PROGRESS NOTE    Calvin [name]  CBS:[id number] DOB: 08-Nov-1950 DOA: 10/16/2021 PCP: Pcp, No    Brief Narrative:  71 y.o. male with medical history significant of Ogilvie's syndrome, ileus, hypertension, hyperlipidemia, COPD, stroke with mild left-sided weakness, GERD, gout, depression with anxiety, seizure, CAD, MI,  alcohol abuse, CKD stage IIIa, dCHF, colon cancer, thyroid cancer, who presents with nausea vomiting, abdominal pain.    Patient is a poor historian, history is limited. Pt states that has been having abdominal pain for 2 days, associated with nausea, nonbilious nonbloody vomiting several times.  He also has abdominal distention.  Per his daughter (I called her daughter by phone), she noticed patient's abdominal distention several days ago.  Patient states that he has diarrhea, but per nurse observation, patient's stools are formed in the ED.  Patient does not have chest pain, cough, shortness of breath.  Patient has fever 100.4 in ED. patient continues to have ileus.  GI and general surgery following.Due to persistent ileus/pseudoobstruction, neostigmine was administered 2/20 with decent response.  Repeat dose again on 2/21 with poor response.  TPN initiated 2/21  GI performed decompressive therapy via colonoscope on 2/22.  Good result with immediate improvement in abdominal distention.  Patient more comfortable appearing on 2/23.  Neostigmine discontinued.  Discussed with GI.  Transfer back to medical floor  Abdominal distention and pain has improved.  Patient endorsing hunger.  Seems to be tolerating PO intake.   Assessment & Plan:   Principal Problem:   Abdominal pain Active Problems:   Seizures (HCC)   HTN (hypertension)   CAD (coronary artery disease)   COPD (chronic obstructive pulmonary disease) (HCC)   Depression with anxiety   Ogilvie's syndrome   UTI (urinary tract infection)   HLD (hyperlipidemia)   Stroke (HCC)   Sepsis (HCC)   Chronic kidney disease, stage  3a (HCC)   Tobacco abuse   Abdominal distention  Abdominal pain secondary to partial small bowel obstruction/ileus -Concern due to Ogilivie Syndrome from Narcotic use.  Persistent abdominal distention.  Repeat scans does not show any obvious obstruction.  Had good response to neostigmine on 2/20.  He had poor response to neostigmine therefore TPN was initiated. -GI and general surgery following. -Surgery signed off as no evidence of mechanical obstruction -Status post decompressive therapy via colonoscope on 2/22, good result Plan: Soft diet Out of bed to chair, daily therapy Daily MiraLAX, can increase to BID if no BM in next day or 2 TPN stopped   History of Ogilvie syndrome Daily MiraLAX as above, consider increasing to BID   Possible UTI, felt unlikely Sepsis ruled out -Ucx grew Enteroccocus, On Ampicillin.  Completed 7-day course   CAD (coronary artery disease), no chest pain. -Continue aspirin and statin   COPD (chronic obstructive pulmonary disease) ([place]): Stable -Bronchodilators as needed   Depression with anxiety PTA Zoloft and Xanax   HLD (hyperlipidemia) -Continue statin   HTN (hypertension) -Currently home medications are on hold.  IV hydralazine and Lopressor as needed   Seizure -Seizure precautions.  Currently on IV Keppra   History of Stroke ([place]) -ASA and zocor   Chronic kidney disease, stage 3a ([place]): stable -f/u by BMP   Tobacco abuse: -Nicotine patch  Possible cognitive decline Patient with poor insight into his disease process    DVT prophylaxis: SQ heparin Code Status: Full Family Communication: daughter Calvin [name] [phone] on 2/25 Disposition Plan: Status is: Inpatient Remains inpatient appropriate because: Ogilvie syndrome on TPN.  Level of care: Med-Surg  Consultants:  GI  Procedures:  Colonoscopy 2/22  Antimicrobials: None   Subjective: Seen and examined.  Feeling better this AM.  Abdominal distention  improved  Objective: Vitals:   10/29/21 0254 10/29/21 0311 10/29/21 0439 10/29/21 0738  BP: (!) 94/58  (!) 98/20 (!) 81/53  Pulse: 88  88 75  Resp:   18 16  Temp: 97.7 F (36.5 C)  97.7 F (36.5 C) (!) 97.4 F (36.3 C)  TempSrc: Oral     SpO2: 98%  98% 100%  Weight:  93.8 kg    Height:        Intake/Output Summary (Last 24 hours) at 10/29/2021 1045 Last data filed at 10/29/2021 0959 Gross per 24 hour  Intake 720 ml  Output 900 ml  Net -180 ml   Filed Weights   10/26/21 1503 10/27/21 0500 10/29/21 0311  Weight: 92.6 kg 94.6 kg 93.8 kg    Examination:  General exam: No acute distress Respiratory system: Clear to auscultation. Respiratory effort normal. Cardiovascular system: S1-S2 RRR, no murmurs, no pedal edema Gastrointestinal system: Soft, nontender, nondistended, normal bowel sounds Central nervous system: Alert and oriented. No focal neurological deficits. Extremities: Symmetric 5 x 5 power. Skin: No rashes, lesions or ulcers Psychiatry: Judgement and insight appear impaired. Mood & affect flattened.     Data Reviewed: I have personally reviewed following labs and imaging studies  CBC: Recent Labs  Lab 10/23/21 0451 10/24/21 0425 10/25/21 0448 10/26/21 0330  WBC 3.3* 4.8 5.0 4.7  HGB 12.3* 11.4* 12.3* 11.8*  HCT 38.6* 35.3* 39.0 36.4*  MCV 93.5 93.6 95.4 93.6  PLT 181 161 181 272   Basic Metabolic Panel: Recent Labs  Lab 10/23/21 0451 10/24/21 0425 10/25/21 0448 10/26/21 0330 10/27/21 0558 10/27/21 0754 10/27/21 2139 10/28/21 0408 10/29/21 0445  NA 141 142 140 142 140  --   --  136 135  K 3.7 3.4* 4.0 4.4 5.2* 5.3* 4.9 4.8 5.2*  CL 112* 112* 110 109 107  --   --  103 101  CO2 21* 24 24 27 26   --   --  24 25  GLUCOSE 105* 79 91 108* 114*  --   --  122* 115*  BUN 8 7* 7* 8 14  --   --  27* 46*  CREATININE 0.92 0.98 1.06 0.96 0.93  --   --  1.46* 1.85*  CALCIUM 8.7* 8.3* 8.5* 9.0 9.2  --   --  9.3 9.3  MG 1.7 1.6* 2.1 1.7 1.9  --   --   --    --   PHOS  --   --  3.6 4.0 3.9  --   --   --   --    GFR: Estimated Creatinine Clearance: 42.6 mL/min (A) (by C-G formula based on SCr of 1.85 mg/dL (H)). Liver Function Tests: Recent Labs  Lab 10/26/21 0330  AST 19  ALT 22  ALKPHOS 47  BILITOT 0.3  PROT 6.1*  ALBUMIN 3.1*   No results for input(s): LIPASE, AMYLASE in the last 168 hours. No results for input(s): AMMONIA in the last 168 hours. Coagulation Profile: No results for input(s): INR, PROTIME in the last 168 hours. Cardiac Enzymes: No results for input(s): CKTOTAL, CKMB, CKMBINDEX, TROPONINI in the last 168 hours. BNP (last 3 results) No results for input(s): PROBNP in the last 8760 hours. HbA1C: No results for input(s): HGBA1C in the last 72 hours. CBG: Recent Labs  Lab 10/28/21  0543 10/28/21 1216 10/28/21 1744 10/28/21 2258 10/29/21 0523  GLUCAP 127* 90 118* 164* 99   Lipid Profile: No results for input(s): CHOL, HDL, LDLCALC, TRIG, CHOLHDL, LDLDIRECT in the last 72 hours.  Thyroid Function Tests: No results for input(s): TSH, T4TOTAL, FREET4, T3FREE, THYROIDAB in the last 72 hours. Anemia Panel: No results for input(s): VITAMINB12, FOLATE, FERRITIN, TIBC, IRON, RETICCTPCT in the last 72 hours. Sepsis Labs: No results for input(s): PROCALCITON, LATICACIDVEN in the last 168 hours.  No results found for this or any previous visit (from the past 240 hour(s)).        Radiology Studies: US RENAL  Result Date: 10/29/2021 CLINICAL DATA:  Renal dysfunction EXAM: RENAL / URINARY TRACT ULTRASOUND COMPLETE COMPARISON:  None. FINDINGS: Right Kidney: Renal measurements: 8.6 x 4.1 x 4.4 cm = volume: 81.9 mL. There is no hydronephrosis. There is increased cortical echogenicity. Left Kidney: Renal measurements: 9.7 x 4.5 x 3.5 cm = volume: 80 mL. There is increased cortical echogenicity. There is no hydronephrosis. Bladder: Unremarkable. Other: None. IMPRESSION: There is no hydronephrosis. There is increased  cortical echogenicity in renal cortex on both sides suggesting medical renal disease. Electronically Signed   By: Elmer Picker M.D.   On: 10/29/2021 10:34        Scheduled Meds:  ALPRAZolam  0.5 mg Oral BID   aspirin  81 mg Oral Daily   B-complex with vitamin C  1 tablet Oral Daily   calcium-vitamin D  1 tablet Oral BID   chewing gum (ORBIT) sugar free  1 Stick Oral TID   Chlorhexidine Gluconate Cloth  6 each Topical Daily   feeding supplement  237 mL Oral TID BM   gabapentin  800 mg Oral TID   heparin  5,000 Units Subcutaneous Q8H   levETIRAcetam  1,000 mg Oral Q12H   multivitamin with minerals  1 tablet Oral Daily   nicotine  21 mg Transdermal Daily   nystatin   Topical BID   pantoprazole (PROTONIX) IV  40 mg Intravenous Q24H   polyethylene glycol  17 g Oral Daily   sertraline  50 mg Oral Daily   sodium chloride flush  10-40 mL Intracatheter Q12H   tamsulosin  0.4 mg Oral QPM   tiotropium  18 mcg Inhalation Daily   Continuous Infusions:  sodium chloride 10 mL/hr at 10/28/21 1039   sodium chloride 100 mL/hr at 10/29/21 7867     LOS: 13 days       Sidney Ace, MD [facility]   If 7PM-7AM, please contact night-coverage  10/29/2021, 10:45 AM

## 2021-10-29 NOTE — Plan of Care (Signed)
  Problem: Health Behavior/Discharge Planning: Goal: Ability to manage health-related needs will improve Outcome: Progressing   

## 2021-10-29 NOTE — Progress Notes (Signed)
Mobility Specialist - Progress Note   10/29/21 1400  Mobility  Range of Motion/Exercises All extremities;Passive;Active  Level of Assistance Minimal assist, patient does 75% or more  Assistive Device None  Activity Response Tolerated well  $Mobility charge 1 Mobility    Pt was supine upon arrival using RA. Pt refused ambulation no reason specified but agreeable to BLE and BUE [place] with MinA on left side. Pt left with needs and bed alarm set.  Merrily Brittle Mobility Specialist 10/29/21, 2:53 PM

## 2021-10-29 NOTE — Progress Notes (Signed)
Mobility Specialist - Progress Note   10/29/21 1200  Mobility  Activity Refused mobility    Pt supine upon arrival. Pt refused session d/t lack of sleep t/o the night. Will attempt at another date and time.  Merrily Brittle Mobility Specialist 10/29/21, 12:05 PM

## 2021-10-30 ENCOUNTER — Inpatient Hospital Stay: Payer: Medicare Other

## 2021-10-30 LAB — BASIC METABOLIC PANEL
Anion gap: 6 (ref 5–15)
BUN: 37 mg/dL — ABNORMAL HIGH (ref 8–23)
CO2: 25 mmol/L (ref 22–32)
Calcium: 9.3 mg/dL (ref 8.9–10.3)
Chloride: 106 mmol/L (ref 98–111)
Creatinine, Ser: 1.23 mg/dL (ref 0.61–1.24)
GFR, Estimated: >60 mL/min (ref >60)
Glucose, Bld: 110 mg/dL — ABNORMAL HIGH (ref 70–99)
Potassium: 4.9 mmol/L (ref 3.5–5.1)
Sodium: 137 mmol/L (ref 135–145)

## 2021-10-30 LAB — GLUCOSE, CAPILLARY: Glucose-Capillary: 97 mg/dL (ref 70–99)

## 2021-10-30 MED ORDER — POLYETHYLENE GLYCOL 3350 17 G PO PACK
17.0000 g | PACK | Freq: Two times a day (BID) | ORAL | Status: DC
  Administered 2021-10-30 – 2021-11-01 (×3): 17 g via ORAL
  Filled 2021-10-30 (×7): qty 1

## 2021-10-30 NOTE — Progress Notes (Signed)
PROGRESS NOTE    Calvin [name]  ENI:[id number] DOB: Feb 21, 1951 DOA: 10/16/2021 PCP: Pcp, No    Brief Narrative:  71 y.o. male with medical history significant of Ogilvie's syndrome, ileus, hypertension, hyperlipidemia, COPD, stroke with mild left-sided weakness, GERD, gout, depression with anxiety, seizure, CAD, MI,  alcohol abuse, CKD stage IIIa, dCHF, colon cancer, thyroid cancer, who presents with nausea vomiting, abdominal pain.    Patient is a poor historian, history is limited. Pt states that has been having abdominal pain for 2 days, associated with nausea, nonbilious nonbloody vomiting several times.  He also has abdominal distention.  Per his daughter (I called her daughter by phone), she noticed patient's abdominal distention several days ago.  Patient states that he has diarrhea, but per nurse observation, patient's stools are formed in the ED.  Patient does not have chest pain, cough, shortness of breath.  Patient has fever 100.4 in ED. patient continues to have ileus.  GI and general surgery following.Due to persistent ileus/pseudoobstruction, neostigmine was administered 2/20 with decent response.  Repeat dose again on 2/21 with poor response.  TPN initiated 2/21  GI performed decompressive therapy via colonoscope on 2/22.  Good result with immediate improvement in abdominal distention.  Patient more comfortable appearing on 2/23.  Neostigmine discontinued.  Discussed with GI.  Transfer back to medical floor  Abdominal distention and pain has improved.  Patient endorsing hunger.  Seems to be tolerating PO intake.   Assessment & Plan:   Principal Problem:   Abdominal pain Active Problems:   Seizures (HCC)   HTN (hypertension)   CAD (coronary artery disease)   COPD (chronic obstructive pulmonary disease) (HCC)   Depression with anxiety   Ogilvie's syndrome   UTI (urinary tract infection)   HLD (hyperlipidemia)   Stroke (HCC)   Sepsis (HCC)   Chronic kidney disease, stage  3a (HCC)   Tobacco abuse   Abdominal distention  Abdominal pain secondary to partial small bowel obstruction/ileus -Concern due to Ogilivie Syndrome from Narcotic use.  Persistent abdominal distention.  Repeat scans does not show any obvious obstruction.  Had good response to neostigmine on 2/20.  He had poor response to neostigmine therefore TPN was initiated. -GI and general surgery following. -Surgery signed off as no evidence of mechanical obstruction -Status post decompressive therapy via colonoscope on 2/22, good result -Patient remains highly unmotivated.  Refuses out of bed and mobility often.  Belly becoming more distended again on 2/26. Plan: Soft diet Out of bed to chair, daily therapy Increase MiraLAX to twice daily TPN stopped   History of Ogilvie syndrome Twice daily MiraLAX   Possible UTI, felt unlikely Sepsis ruled out -Ucx grew Enteroccocus, On Ampicillin.  Completed 7-day course   CAD (coronary artery disease), no chest pain. -Continue aspirin and statin   COPD (chronic obstructive pulmonary disease) ([place]): Stable -Bronchodilators as needed   Depression with anxiety PTA Zoloft and Xanax   HLD (hyperlipidemia) -Continue statin   HTN (hypertension) -Currently home medications are on hold.  IV hydralazine and Lopressor as needed   Seizure -Seizure precautions.  Currently on IV Keppra   History of Stroke ([place]) -ASA and zocor   Chronic kidney disease, stage 3a ([place]): stable -f/u by BMP   Tobacco abuse: -Nicotine patch  Possible cognitive decline Patient with poor insight into his disease process    DVT prophylaxis: SQ heparin Code Status: Full Family Communication: daughter [name] [phone] on 2/25 Disposition Plan: Status is: Inpatient Remains inpatient appropriate because: I  will be syndrome.  TPN stopped.  If patient has bowel movement abdominal distention improves anticipate medical redness for discharge in 24 to 48  hours.  Level of care: Med-Surg  Consultants:  GI  Procedures:  Colonoscopy 2/22  Antimicrobials: None   Subjective: Seen and examined.  Feeling better this AM.  Abdominal distention persistent.  Worse this morning  Objective: Vitals:   10/30/21 0300 10/30/21 0348 10/30/21 0510 10/30/21 0755  BP:  119/74 99/88 108/66  Pulse:  84 84 77  Resp:  20 18 17   Temp:  97.9 F (36.6 C) (!) 97.3 F (36.3 C) 98.1 F (36.7 C)  TempSrc:    Oral  SpO2:  100% 98% 99%  Weight: 94.1 kg     Height:        Intake/Output Summary (Last 24 hours) at 10/30/2021 1048 Last data filed at 10/30/2021 0954 Gross per 24 hour  Intake 1859.11 ml  Output 3550 ml  Net -1690.89 ml   Filed Weights   10/27/21 0500 10/29/21 0311 10/30/21 0300  Weight: 94.6 kg 93.8 kg 94.1 kg    Examination:  General exam: No acute distress Respiratory system: Clear to auscultation. Respiratory effort normal. Cardiovascular system: S1-S2 RRR, no murmurs, no pedal edema Gastrointestinal system: Distended, mild tender to palpation, hyperactive bowel sounds Central nervous system: Alert and oriented. No focal neurological deficits. Extremities: Symmetric 5 x 5 power. Skin: No rashes, lesions or ulcers Psychiatry: Judgement and insight appear impaired. Mood & affect flattened.     Data Reviewed: I have personally reviewed following labs and imaging studies  CBC: Recent Labs  Lab 10/24/21 0425 10/25/21 0448 10/26/21 0330  WBC 4.8 5.0 4.7  HGB 11.4* 12.3* 11.8*  HCT 35.3* 39.0 36.4*  MCV 93.6 95.4 93.6  PLT 161 181 366   Basic Metabolic Panel: Recent Labs  Lab 10/24/21 0425 10/25/21 0448 10/26/21 0330 10/27/21 0558 10/27/21 0754 10/27/21 2139 10/28/21 0408 10/29/21 0445 10/30/21 0703  NA 142 140 142 140  --   --  136 135 137  K 3.4* 4.0 4.4 5.2* 5.3* 4.9 4.8 5.2* 4.9  CL 112* 110 109 107  --   --  103 101 106  CO2 24 24 27 26   --   --  24 25 25   GLUCOSE 79 91 108* 114*  --   --  122* 115* 110*   BUN 7* 7* 8 14  --   --  27* 46* 37*  CREATININE 0.98 1.06 0.96 0.93  --   --  1.46* 1.85* 1.23  CALCIUM 8.3* 8.5* 9.0 9.2  --   --  9.3 9.3 9.3  MG 1.6* 2.1 1.7 1.9  --   --   --   --   --   PHOS  --  3.6 4.0 3.9  --   --   --   --   --    GFR: Estimated Creatinine Clearance: 64.1 mL/min (by C-G formula based on SCr of 1.23 mg/dL). Liver Function Tests: Recent Labs  Lab 10/26/21 0330  AST 19  ALT 22  ALKPHOS 47  BILITOT 0.3  PROT 6.1*  ALBUMIN 3.1*   No results for input(s): LIPASE, AMYLASE in the last 168 hours. No results for input(s): AMMONIA in the last 168 hours. Coagulation Profile: No results for input(s): INR, PROTIME in the last 168 hours. Cardiac Enzymes: No results for input(s): CKTOTAL, CKMB, CKMBINDEX, TROPONINI in the last 168 hours. BNP (last 3 results) No results for input(s): PROBNP  in the last 8760 hours. HbA1C: No results for input(s): HGBA1C in the last 72 hours. CBG: Recent Labs  Lab 10/28/21 2258 10/29/21 0523 10/29/21 1140 10/29/21 1739 10/30/21 0634  GLUCAP 164* 99 99 112* 97   Lipid Profile: No results for input(s): CHOL, HDL, LDLCALC, TRIG, CHOLHDL, LDLDIRECT in the last 72 hours.  Thyroid Function Tests: No results for input(s): TSH, T4TOTAL, FREET4, T3FREE, THYROIDAB in the last 72 hours. Anemia Panel: No results for input(s): VITAMINB12, FOLATE, FERRITIN, TIBC, IRON, RETICCTPCT in the last 72 hours. Sepsis Labs: No results for input(s): PROCALCITON, LATICACIDVEN in the last 168 hours.  No results found for this or any previous visit (from the past 240 hour(s)).        Radiology Studies: US RENAL  Result Date: 10/29/2021 CLINICAL DATA:  Renal dysfunction EXAM: RENAL / URINARY TRACT ULTRASOUND COMPLETE COMPARISON:  None. FINDINGS: Right Kidney: Renal measurements: 8.6 x 4.1 x 4.4 cm = volume: 81.9 mL. There is no hydronephrosis. There is increased cortical echogenicity. Left Kidney: Renal measurements: 9.7 x 4.5 x 3.5 cm =  volume: 80 mL. There is increased cortical echogenicity. There is no hydronephrosis. Bladder: Unremarkable. Other: None. IMPRESSION: There is no hydronephrosis. There is increased cortical echogenicity in renal cortex on both sides suggesting medical renal disease. Electronically Signed   By: Elmer Picker M.D.   On: 10/29/2021 10:34        Scheduled Meds:  ALPRAZolam  0.5 mg Oral BID   aspirin  81 mg Oral Daily   B-complex with vitamin C  1 tablet Oral Daily   calcium-vitamin D  1 tablet Oral BID   chewing gum (ORBIT) sugar free  1 Stick Oral TID   Chlorhexidine Gluconate Cloth  6 each Topical Daily   feeding supplement  237 mL Oral TID BM   gabapentin  800 mg Oral TID   heparin  5,000 Units Subcutaneous Q8H   levETIRAcetam  1,000 mg Oral Q12H   multivitamin with minerals  1 tablet Oral Daily   nicotine  21 mg Transdermal Daily   nystatin   Topical BID   pantoprazole (PROTONIX) IV  40 mg Intravenous Q24H   polyethylene glycol  17 g Oral BID   sertraline  50 mg Oral Daily   sodium chloride flush  10-40 mL Intracatheter Q12H   tamsulosin  0.4 mg Oral QPM   tiotropium  18 mcg Inhalation Daily   Continuous Infusions:  sodium chloride 10 mL/hr at 10/28/21 1039     LOS: 14 days       Sidney Ace, MD [facility]   If 7PM-7AM, please contact night-coverage  10/30/2021, 10:48 AM

## 2021-10-30 NOTE — TOC Progression Note (Signed)
Transition of Care [facility]) - Progression Note    Patient Details  Name: ZAKERY NORMINGTON MRN: [id number] Date of Birth: December 10, 1950  Transition of Care [facility]) CM/SW [place], [place] Phone Number: 10/30/2021, 2:30 PM  Clinical Narrative:  Patient is expected to discharge tomorrow. CSW attempted to login to [facility] to start insurance authorization and patient is not listed in the portal. CSW contacted [facility] at [phone]. CSW confirmed patient is not Navi. CSW contacted [facility] and was told there is no Scientist, physiological to start any Biomedical engineer and was told to call back tomorrow at 8:30 AM    Expected Discharge Plan: [place] Barriers to Discharge: Continued Medical Work up  Expected Discharge Plan and Services Expected Discharge Plan: [place] Acute Care Choice: Resumption of Svcs/PTA Provider Living arrangements for the past 2 months: [place]                                       Social Determinants of Health (SDOH) Interventions    Readmission Risk Interventions Readmission Risk Prevention Plan 10/17/2021 08/08/2019  Transportation Screening Complete Complete  PCP or Specialist Appt within 3-5 Days Complete -  HRI or [place] - Not Complete  HRI or Home Care Consult comments - not indicated from [place] for [place] Planning/Counseling Complete -  [place] Screening Not Applicable -  Medication Review Press photographer) Complete Complete  Some recent data might be hidden

## 2021-10-30 NOTE — Progress Notes (Signed)
Mobility Specialist - Progress Note   10/30/21 1200  Mobility  Activity Ambulated with assistance in hallway;Stood at bedside;Transferred to/from BSC;Dangled on edge of bed  Level of Assistance Minimal assist, patient does 75% or more  Assistive Device Standard walker  Distance Ambulated (ft) 80 ft  Activity Response Tolerated well  $Mobility charge 1 Mobility    Pt sleeping upon arrival utilizing RA. Pt agrees to ambulation with max encouragement. Pt sat EOB and STS with MinA + extra time ---- weakness on Left. Pt ambulated 30ft to nursing station with no complaints ---- needs vc for correct RW use. Ambulated 67ft to bathroom for BM in, RN notified. Ambulated to EOB for lunch with needs in reach and bed alarm set.  Merrily Brittle Mobility Specialist 10/30/21, 12:52 PM

## 2021-10-31 ENCOUNTER — Inpatient Hospital Stay: Payer: Medicare Other

## 2021-10-31 LAB — BASIC METABOLIC PANEL
Anion gap: 12 (ref 5–15)
BUN: 27 mg/dL — ABNORMAL HIGH (ref 8–23)
CO2: 26 mmol/L (ref 22–32)
Calcium: 9.7 mg/dL (ref 8.9–10.3)
Chloride: 104 mmol/L (ref 98–111)
Creatinine, Ser: 1.19 mg/dL (ref 0.61–1.24)
GFR, Estimated: >60 mL/min (ref >60)
Glucose, Bld: 93 mg/dL (ref 70–99)
Potassium: 5.2 mmol/L — ABNORMAL HIGH (ref 3.5–5.1)
Sodium: 142 mmol/L (ref 135–145)

## 2021-10-31 LAB — RESP PANEL BY RT-PCR (FLU A&B, COVID) ARPGX2
Influenza A by PCR: NEGATIVE
Influenza B by PCR: NEGATIVE
SARS Coronavirus 2 by RT PCR: NEGATIVE

## 2021-10-31 NOTE — Progress Notes (Signed)
GI Inpatient Follow-up Note  Subjective:  Patient seen and examined. Denies any abdominal pain or discomfort. Passed a large amount of gas earlier today per PT along with stool.  Scheduled Inpatient Medications:   ALPRAZolam  0.5 mg Oral BID   aspirin  81 mg Oral Daily   B-complex with vitamin C  1 tablet Oral Daily   calcium-vitamin D  1 tablet Oral BID   chewing gum (ORBIT) sugar free  1 Stick Oral TID   Chlorhexidine Gluconate Cloth  6 each Topical Daily   feeding supplement  237 mL Oral TID BM   gabapentin  800 mg Oral TID   heparin  5,000 Units Subcutaneous Q8H   levETIRAcetam  1,000 mg Oral Q12H   multivitamin with minerals  1 tablet Oral Daily   nicotine  21 mg Transdermal Daily   nystatin   Topical BID   pantoprazole (PROTONIX) IV  40 mg Intravenous Q24H   polyethylene glycol  17 g Oral BID   sertraline  50 mg Oral Daily   sodium chloride flush  10-40 mL Intracatheter Q12H   tamsulosin  0.4 mg Oral QPM   tiotropium  18 mcg Inhalation Daily    Continuous Inpatient Infusions:    sodium chloride 10 mL/hr at 10/28/21 1039    PRN Inpatient Medications:  sodium chloride, acetaminophen, albuterol, alum & mag hydroxide-simeth, dextromethorphan-guaiFENesin, diclofenac Sodium, hydrALAZINE, LORazepam, LORazepam, menthol-cetylpyridinium, metoprolol tartrate, nitroGLYCERIN, nystatin, ondansetron (ZOFRAN) IV, sodium chloride flush, traZODone  Review of Systems:  Review of Systems  Constitutional:  Negative for chills and fever.  Respiratory:  Negative for shortness of breath.   Cardiovascular:  Negative for chest pain.  Gastrointestinal:  Negative for abdominal pain, blood in stool, constipation, diarrhea, nausea and vomiting.  Skin:  Negative for rash.  Neurological:  Negative for focal weakness.  Psychiatric/Behavioral:  Negative for substance abuse.   All other systems reviewed and are negative.    Physical Examination: BP 101/82 (BP Location: Left Arm)    Pulse 88     Temp 97.8 F (36.6 C) (Oral)    Resp 18    Ht 5' 11.5" (1.816 m)    Wt 92.1 kg    SpO2 100%    BMI 27.92 kg/m  Gen: NAD, alert and oriented x 4 HEENT: PEERLA, EOMI, Neck: supple, no JVD or thyromegaly Chest: No respiratory distress Abd: soft but distended Ext: no edema, well perfused with 2+ pulses, Skin: no rash or lesions noted Lymph: no LAD  Data: Lab Results  Component Value Date   WBC 4.7 10/26/2021   HGB 11.8 (L) 10/26/2021   HCT 36.4 (L) 10/26/2021   MCV 93.6 10/26/2021   PLT 180 10/26/2021   Recent Labs  Lab 10/25/21 0448 10/26/21 0330  HGB 12.3* 11.8*   Lab Results  Component Value Date   NA 142 10/31/2021   K 5.2 (H) 10/31/2021   CL 104 10/31/2021   CO2 26 10/31/2021   BUN 27 (H) 10/31/2021   CREATININE 1.19 10/31/2021   Lab Results  Component Value Date   ALT 22 10/26/2021   AST 19 10/26/2021   ALKPHOS 47 10/26/2021   BILITOT 0.3 10/26/2021   No results for input(s): APTT, INR, PTT in the last 168 hours. Assessment/Plan: [name] is a 71 y.o. gentleman with multiple medical problems including COPD, chronic abdominal pain, CVA's, and history of colon cancer s/p sigmoidectomy here with ileus/ogilvies due to chronic narcotics that has resolved after colonic decompression. Has had recurrent distension  but patient is passing gas and having bowel movements. He is otherwise clinically stable  Recommendations:  - continue diet - replete electrolytes as needed - continue miralax - continue aggressive PT/OT - avoid narcotics - patient is at high risk of recurrence of symptoms but given tolerating PO diet, passing gas/having bowel movements, and no abdominal pain/nausea no indication for further therapy at this time. If has recurrence of symptoms then will need to discuss surgical options  Please call with any questions or concerns.   Raylene Miyamoto MD, MPH [place]

## 2021-10-31 NOTE — Progress Notes (Signed)
Physical Therapy Treatment Patient Details Name: Calvin [name] MRN: [id number] DOB: 11-10-1950 Today's Date: 10/31/2021   History of Present Illness Pt is a 71 y/o M admitted on 10/16/21 after presenting with c/c of N&V & abdominal pain. Pt is being treated for abdominal pain 2/2 partial SBO/ileus.   PMH: Ogilvie's syndrome, ileus, HTN, HLD, COPD, stroke with mild L sided weakness, GERD, gout, depression with anxiety, seizure, CAD, MI, alcohol abuse, CKD stage 3a, dCHF, colon CA, thyroid CA. Transferred to higher level of care on 2/20 for neostigmine administration, now with new PT/OT orders.    PT Comments    Pt resting in bed upon PT arrival and requesting to use bathroom.  During session pt SBA to min assist with bed mobility; min to mod assist with transfers using RW; and min assist to ambulate 20 feet x2 with RW (limited distance ambulating d/t pt fatigue post toileting and pt requesting to lay down in bed instead).  Pt noted with stool and gas during toileting (nurse notified).  Will continue to focus on strengthening, balance, and progressive functional mobility during hospitalization.   Recommendations for follow up therapy are one component of a multi-disciplinary discharge planning process, led by the attending physician.  Recommendations may be updated based on patient status, additional functional criteria and insurance authorization.  Follow Up Recommendations  Skilled nursing-short term rehab (<3 hours/day)     Assistance Recommended at Discharge Frequent or constant Supervision/Assistance  [name];[name]   Equipment Recommendations  Rolling walker (2 wheels)    Recommendations for Other Services       Precautions / Restrictions Precautions Precautions: Fall Precaution Comments: HOB > 30 degrees Restrictions Weight Bearing Restrictions: No      Mobility  Bed Mobility Overal bed mobility: Needs Assistance Bed Mobility: Supine to Sit, Sit to Supine     Supine to sit: Min assist, HOB elevated (assist for trunk) Sit to supine: Supervision, HOB elevated   General bed mobility comments: increased effort and time for pt to perform as much as possible on own    Transfers Overall transfer level: Needs assistance Equipment used: Rolling walker (2 wheels) Transfers: Sit to/from Stand Sit to Stand: Min assist, Mod assist           General transfer comment: min assist to stand from bed and min to mod assist to stand from toilet (with R grab rail use); vc's for UE placement    Ambulation/Gait Ambulation/Gait assistance: Min assist Gait Distance (Feet):  (20 feet x2 (to bathroom and back)) Assistive device: Rolling walker (2 wheels)   Gait velocity: decreased     General Gait Details: decreased stance time L LE; vc's to stay closer to RW; decreased B LE step length and foot clearance; assist for balance/safety   Stairs             Wheelchair Mobility    Modified Rankin (Stroke Patients Only)       Balance Overall balance assessment: Needs assistance Sitting-balance support: No upper extremity supported, Feet supported Sitting balance-Leahy Scale: Good Sitting balance - Comments: steady sitting reaching within BOS   Standing balance support: Single extremity supported Standing balance-Leahy Scale: Fair Standing balance comment: steady standing with at least single UE support                            Cognition  Arousal/Alertness: Awake/alert Behavior During Therapy: Flat affect Overall Cognitive Status: Within Functional Limits for tasks assessed                                          Exercises      General Comments  Pt agreeable to PT session.      Pertinent Vitals/Pain Pain Assessment Pain Assessment: Faces Faces Pain Scale: Hurts a little bit Pain Location:  abdominal discomfort Pain Descriptors / Indicators: Tender, Discomfort Pain Intervention(s): Limited activity within patient's tolerance, Monitored during session, Repositioned    Home Living                          Prior Function            PT Goals (current goals can now be found in the care plan section) Acute Rehab PT Goals Patient Stated Goal: to see if I can still walk PT Goal Formulation: With patient Time For Goal Achievement: 11/10/21 Potential to Achieve Goals: Good    Frequency    Min 2X/week      PT Plan      Co-evaluation              AM-PAC PT "6 Clicks" Mobility   Outcome Measure  Help needed turning from your back to your side while in a flat bed without using bedrails?: A Little Help needed moving from lying on your back to sitting on the side of a flat bed without using bedrails?: A Little Help needed moving to and from a bed to a chair (including a wheelchair)?: A Little Help needed standing up from a chair using your arms (e.g., wheelchair or bedside chair)?: A Little Help needed to walk in hospital room?: A Little Help needed climbing 3-5 steps with a railing? : A Lot 6 Click Score: 17    End of Session Equipment Utilized During Treatment: Gait belt Activity Tolerance: Patient tolerated treatment well Patient left: in bed;with call bell/phone within reach;with bed alarm set Nurse Communication: Mobility status;Precautions;Other (comment) (pt toileted during session) PT Visit Diagnosis: Muscle weakness (generalized) (M62.81);Unsteadiness on feet (R26.81)     Time: [id number] PT Time Calculation (min) (ACUTE ONLY): 33 min  Charges:  $Gait Training: 8-22 mins $Therapeutic Activity: 8-22 mins                     Leitha Bleak, PT 10/31/21, 9:59 AM

## 2021-10-31 NOTE — Progress Notes (Signed)
PROGRESS NOTE    Calvin [name]  HEN:[id number] DOB: 1950/09/16 DOA: 10/16/2021 PCP: Pcp, No    Brief Narrative:  71 y.o. male with medical history significant of Ogilvie's syndrome, ileus, hypertension, hyperlipidemia, COPD, stroke with mild left-sided weakness, GERD, gout, depression with anxiety, seizure, CAD, MI,  alcohol abuse, CKD stage IIIa, dCHF, colon cancer, thyroid cancer, who presents with nausea vomiting, abdominal pain.    Patient is a poor historian, history is limited. Pt states that has been having abdominal pain for 2 days, associated with nausea, nonbilious nonbloody vomiting several times.  He also has abdominal distention.  Per his daughter (I called her daughter by phone), she noticed patient's abdominal distention several days ago.  Patient states that he has diarrhea, but per nurse observation, patient's stools are formed in the ED.  Patient does not have chest pain, cough, shortness of breath.  Patient has fever 100.4 in ED. patient continues to have ileus.  GI and general surgery following.Due to persistent ileus/pseudoobstruction, neostigmine was administered 2/20 with decent response.  Repeat dose again on 2/21 with poor response.  TPN initiated 2/21  GI performed decompressive therapy via colonoscope on 2/22.  Good result with immediate improvement in abdominal distention.  Patient more comfortable appearing on 2/23.  Neostigmine discontinued.  Discussed with GI.  Transfer back to medical floor.   Abdominal distention and pain had substantially improved after colonic decompression.  Patient has had bowel movements however abdominal distention is recurring now.  On 2/20 7 repeat KUB demonstrates recurrent abdominal distention.  GI aware and will reevaluate for possible repeat colonic decompression versus potentially reengaging surgery.  Assessment & Plan:   Principal Problem:   Abdominal pain Active Problems:   Seizures (HCC)   HTN (hypertension)   CAD (coronary  artery disease)   COPD (chronic obstructive pulmonary disease) (HCC)   Depression with anxiety   Ogilvie's syndrome   UTI (urinary tract infection)   HLD (hyperlipidemia)   Stroke (HCC)   Sepsis (HCC)   Chronic kidney disease, stage 3a (HCC)   Tobacco abuse   Abdominal distention  Abdominal pain secondary to partial small bowel obstruction/ileus -Concern due to Ogilivie Syndrome from Narcotic use.  Persistent abdominal distention.  Repeat scans does not show any obvious obstruction.  Had good response to neostigmine on 2/20.  He had poor response to neostigmine therefore TPN was initiated. -GI and general surgery following. -Surgery signed off as no evidence of mechanical obstruction -Status post decompressive therapy via colonoscope on 2/22, good result -Patient remains highly unmotivated.  Refuses out of bed and mobility often.  Belly becoming more distended again on 2/26. -Patient did have BM x2 however bili remains very distended Plan: Soft diet as tolerated Out of bed to chair, daily therapy Increase MiraLAX to twice daily TPN stopped GI to reevaluate today   History of Ogilvie syndrome Continue twice daily MiraLAX May need to reengage surgery   Possible UTI, felt unlikely Sepsis ruled out -Ucx grew Enteroccocus, On Ampicillin.  Completed 7-day course   CAD (coronary artery disease), no chest pain. -Continue aspirin and statin   COPD (chronic obstructive pulmonary disease) ([place]): Stable -Bronchodilators as needed   Depression with anxiety PTA Zoloft and Xanax   HLD (hyperlipidemia) -Continue statin   HTN (hypertension) -Currently home medications are on hold.  IV hydralazine and Lopressor as needed   Seizure -Seizure precautions.  Currently on IV Keppra   History of Stroke ([place]) -ASA and zocor   Chronic kidney disease, stage  3a ([place]): stable -f/u by BMP   Tobacco abuse: -Nicotine patch  Possible cognitive decline Patient with poor insight into his  disease process    DVT prophylaxis: SQ heparin Code Status: Full Family Communication: daughter [name] [phone] on 2/25.  Attempted to call again on 2/27.  No answer, voicemail full Disposition Plan: Status is: Inpatient Remains inpatient appropriate because: Persistent abdominal distention.  GI to reevaluate  Level of care: Med-Surg  Consultants:  GI  Procedures:  Colonoscopy 2/22  Antimicrobials: None   Subjective: Seen and examined.  Feeling better this AM.  Abdominal distention persistent.  Worse this morning  Objective: Vitals:   10/30/21 2016 10/31/21 0455 10/31/21 0500 10/31/21 0732  BP: 133/78 119/75  101/82  Pulse: 91 91  88  Resp: 15 16  18   Temp: 98 F (36.7 C) 98 F (36.7 C)  97.8 F (36.6 C)  TempSrc:    Oral  SpO2: 100% 100%  100%  Weight:   92.1 kg   Height:        Intake/Output Summary (Last 24 hours) at 10/31/2021 1148 Last data filed at 10/31/2021 1040 Gross per 24 hour  Intake 720 ml  Output 1700 ml  Net -980 ml   Filed Weights   10/29/21 0311 10/30/21 0300 10/31/21 0500  Weight: 93.8 kg 94.1 kg 92.1 kg    Examination:  General exam: No acute distress Respiratory system: Clear to auscultation. Respiratory effort normal. Cardiovascular system: S1-S2 RRR, no murmurs, no pedal edema Gastrointestinal system: Distended, nontender, hyperactive bowel sounds Central nervous system: Alert and oriented. No focal neurological deficits. Extremities: Symmetric 5 x 5 power. Skin: No rashes, lesions or ulcers Psychiatry: Judgement and insight appear impaired. Mood & affect appropriate.     Data Reviewed: I have personally reviewed following labs and imaging studies  CBC: Recent Labs  Lab 10/25/21 0448 10/26/21 0330  WBC 5.0 4.7  HGB 12.3* 11.8*  HCT 39.0 36.4*  MCV 95.4 93.6  PLT 181 657   Basic Metabolic Panel: Recent Labs  Lab 10/25/21 0448 10/26/21 0330 10/27/21 0558 10/27/21 0754 10/27/21 2139 10/28/21 0408  10/29/21 0445 10/30/21 0703 10/31/21 0612  NA 140 142 140  --   --  136 135 137 142  K 4.0 4.4 5.2*   < > 4.9 4.8 5.2* 4.9 5.2*  CL 110 109 107  --   --  103 101 106 104  CO2 24 27 26   --   --  24 25 25 26   GLUCOSE 91 108* 114*  --   --  122* 115* 110* 93  BUN 7* 8 14  --   --  27* 46* 37* 27*  CREATININE 1.06 0.96 0.93  --   --  1.46* 1.85* 1.23 1.19  CALCIUM 8.5* 9.0 9.2  --   --  9.3 9.3 9.3 9.7  MG 2.1 1.7 1.9  --   --   --   --   --   --   PHOS 3.6 4.0 3.9  --   --   --   --   --   --    < > = values in this interval not displayed.   GFR: Estimated Creatinine Clearance: 65.6 mL/min (by C-G formula based on SCr of 1.19 mg/dL). Liver Function Tests: Recent Labs  Lab 10/26/21 0330  AST 19  ALT 22  ALKPHOS 47  BILITOT 0.3  PROT 6.1*  ALBUMIN 3.1*   No results for input(s): LIPASE, AMYLASE in the  last 168 hours. No results for input(s): AMMONIA in the last 168 hours. Coagulation Profile: No results for input(s): INR, PROTIME in the last 168 hours. Cardiac Enzymes: No results for input(s): CKTOTAL, CKMB, CKMBINDEX, TROPONINI in the last 168 hours. BNP (last 3 results) No results for input(s): PROBNP in the last 8760 hours. HbA1C: No results for input(s): HGBA1C in the last 72 hours. CBG: Recent Labs  Lab 10/28/21 2258 10/29/21 0523 10/29/21 1140 10/29/21 1739 10/30/21 0634  GLUCAP 164* 99 99 112* 97   Lipid Profile: No results for input(s): CHOL, HDL, LDLCALC, TRIG, CHOLHDL, LDLDIRECT in the last 72 hours.  Thyroid Function Tests: No results for input(s): TSH, T4TOTAL, FREET4, T3FREE, THYROIDAB in the last 72 hours. Anemia Panel: No results for input(s): VITAMINB12, FOLATE, FERRITIN, TIBC, IRON, RETICCTPCT in the last 72 hours. Sepsis Labs: No results for input(s): PROCALCITON, LATICACIDVEN in the last 168 hours.  Recent Results (from the past 240 hour(s))  Resp Panel by RT-PCR (Flu A&B, Covid) Nasopharyngeal Swab     Status: None   Collection Time:  10/31/21  8:11 AM   Specimen: Nasopharyngeal Swab; Nasopharyngeal(NP) swabs in vial transport medium  Result Value Ref Range Status   SARS Coronavirus 2 by RT PCR NEGATIVE NEGATIVE Final    Comment: (NOTE) SARS-CoV-2 target nucleic acids are NOT DETECTED.  The SARS-CoV-2 RNA is generally detectable in upper respiratory specimens during the acute phase of infection. The lowest concentration of SARS-CoV-2 viral copies this assay can detect is 138 copies/mL. A negative result does not preclude SARS-Cov-2 infection and should not be used as the sole basis for treatment or other patient management decisions. A negative result may occur with  improper specimen collection/handling, submission of specimen other than nasopharyngeal swab, presence of viral mutation(s) within the areas targeted by this assay, and inadequate number of viral copies(<138 copies/mL). A negative result must be combined with clinical observations, patient history, and epidemiological information. The expected result is Negative.  Fact Sheet for Patients:  EntrepreneurPulse.com.au  Fact Sheet for Healthcare Providers:  IncredibleEmployment.be  This test is no t yet approved or cleared by the [place] FDA and  has been authorized for detection and/or diagnosis of SARS-CoV-2 by FDA under an Emergency Use Authorization (EUA). This EUA will remain  in effect (meaning this test can be used) for the duration of the COVID-19 declaration under Section 564(b)(1) of the Act, 21 U.S.C.section 360bbb-3(b)(1), unless the authorization is terminated  or revoked sooner.       Influenza A by PCR NEGATIVE NEGATIVE Final   Influenza B by PCR NEGATIVE NEGATIVE Final    Comment: (NOTE) The Xpert Xpress SARS-CoV-2/FLU/RSV plus assay is intended as an aid in the diagnosis of influenza from Nasopharyngeal swab specimens and should not be used as a sole basis for treatment. Nasal washings  and aspirates are unacceptable for Xpert Xpress SARS-CoV-2/FLU/RSV testing.  Fact Sheet for Patients: EntrepreneurPulse.com.au  Fact Sheet for Healthcare Providers: IncredibleEmployment.be  This test is not yet approved or cleared by the [place] FDA and has been authorized for detection and/or diagnosis of SARS-CoV-2 by FDA under an Emergency Use Authorization (EUA). This EUA will remain in effect (meaning this test can be used) for the duration of the COVID-19 declaration under Section 564(b)(1) of the Act, 21 U.S.C. section 360bbb-3(b)(1), unless the authorization is terminated or revoked.  Performed at [facility], [address], [place], [place] [address]           Radiology Studies:  DG Abd 1 View  Result Date: 10/30/2021 CLINICAL DATA:  Abdominal distension, history of Ogilvie's EXAM: ABDOMEN - 1 VIEW COMPARISON:  10/26/2021 FINDINGS: Recurrence of significant gaseous distension of colon. Distal descending colon measures up to 13 cm. Small bowel appears to be normal in caliber. No acute osseous abnormality. IMPRESSION: Recurrent gaseous distension of the colon. Electronically Signed   By: Macy Mis M.D.   On: 10/30/2021 11:41        Scheduled Meds:  ALPRAZolam  0.5 mg Oral BID   aspirin  81 mg Oral Daily   B-complex with vitamin C  1 tablet Oral Daily   calcium-vitamin D  1 tablet Oral BID   chewing gum (ORBIT) sugar free  1 Stick Oral TID   Chlorhexidine Gluconate Cloth  6 each Topical Daily   feeding supplement  237 mL Oral TID BM   gabapentin  800 mg Oral TID   heparin  5,000 Units Subcutaneous Q8H   levETIRAcetam  1,000 mg Oral Q12H   multivitamin with minerals  1 tablet Oral Daily   nicotine  21 mg Transdermal Daily   nystatin   Topical BID   pantoprazole (PROTONIX) IV  40 mg Intravenous Q24H   polyethylene glycol  17 g Oral BID   sertraline  50 mg Oral Daily   sodium chloride flush  10-40 mL  Intracatheter Q12H   tamsulosin  0.4 mg Oral QPM   tiotropium  18 mcg Inhalation Daily   Continuous Infusions:  sodium chloride 10 mL/hr at 10/28/21 1039     LOS: 15 days       Sidney Ace, MD [facility]   If 7PM-7AM, please contact night-coverage  10/31/2021, 11:48 AM

## 2021-10-31 NOTE — TOC Progression Note (Signed)
Transition of Care [facility]) - Progression Note    Patient Details  Name: Calvin [name] MRN: [id number] Date of Birth: 1951/03/22  Transition of [facility] CM/SW Contact  Beverly Sessions, RN Phone Number: 10/31/2021, 2:18 PM  Clinical Narrative:     Damaris Schooner with Audry Pili at [facility].  He states they do not need auth in order for patient to return.  States they have an optum rep on site and they will obtain auth   Expected Discharge Plan: [place] Barriers to Discharge: Continued Medical Work up  Expected Discharge Plan and Services Expected Discharge Plan: [place] Acute Care Choice: Resumption of Svcs/PTA Provider Living arrangements for the past 2 months: Skilled Nursing Facility                                       Social Determinants of Health (SDOH) Interventions    Readmission Risk Interventions Readmission Risk Prevention Plan 10/17/2021 08/08/2019  Transportation Screening Complete Complete  PCP or Specialist Appt within 3-5 Days Complete -  HRI or [place] - Not Complete  HRI or Home Care Consult comments - not indicated from [place] for [place] Planning/Counseling Complete -  [place] Screening Not Applicable -  Medication Review Press photographer) Complete Complete  Some recent data might be hidden

## 2021-11-01 LAB — BASIC METABOLIC PANEL
Anion gap: 10 (ref 5–15)
BUN: 25 mg/dL — ABNORMAL HIGH (ref 8–23)
CO2: 26 mmol/L (ref 22–32)
Calcium: 9.9 mg/dL (ref 8.9–10.3)
Chloride: 102 mmol/L (ref 98–111)
Creatinine, Ser: 1.23 mg/dL (ref 0.61–1.24)
GFR, Estimated: >60 mL/min (ref >60)
Glucose, Bld: 105 mg/dL — ABNORMAL HIGH (ref 70–99)
Potassium: 4.4 mmol/L (ref 3.5–5.1)
Sodium: 138 mmol/L (ref 135–145)

## 2021-11-01 NOTE — Progress Notes (Signed)
Occupational Therapy Treatment Patient Details Name: Calvin [name] MRN: [id number] DOB: Nov 28, 1950 Today's Date: 11/01/2021   History of present illness Pt is a 71 y/o M admitted on 10/16/21 after presenting with c/c of N&V & abdominal pain. Pt is being treated for abdominal pain 2/2 partial SBO/ileus.   PMH: Ogilvie's syndrome, ileus, HTN, HLD, COPD, stroke with mild L sided weakness, GERD, gout, depression with anxiety, seizure, CAD, MI, alcohol abuse, CKD stage 3a, dCHF, colon CA, thyroid CA. Transferred to higher level of care on 2/20 for neostigmine administration, now with new PT/OT orders.   OT comments  Upon entering the room, pt supine in bed with no c/o pain and agreeable to OT intervention. Pt washes face with set up A to obtain needed items. Pt performs bed mobility with supervision but then refuses OOB activity and reports wanting to rest. He verbalized recently being out of bed but encouraged to engage further this session. Pt ultimately declined further intervention and positioned comfortably in bed with call bell and all needs within reach.    Recommendations for follow up therapy are one component of a multi-disciplinary discharge planning process, led by the attending physician.  Recommendations may be updated based on patient status, additional functional criteria and insurance authorization.    Follow Up Recommendations  Skilled nursing-short term rehab (<3 hours/day)    Assistance Recommended at Discharge Frequent or constant Supervision/Assistance  Patient can return home with the following  A lot of help with walking and/or transfers;A lot of help with bathing/dressing/bathroom;Direct supervision/assist for medications management   Equipment Recommendations  Other (comment) (defer to next venue of care)       Precautions / Restrictions Precautions Precautions: Fall Precaution Comments: HOB > 30 degrees       Mobility Bed Mobility Overal bed mobility: Needs  Assistance Bed Mobility: Supine to Sit, Sit to Supine     Supine to sit: Min assist Sit to supine: Min assist        Transfers                   General transfer comment: pt refusal         ADL either performed or assessed with clinical judgement   ADL Overall ADL's : Needs assistance/impaired     Grooming: Garment/textile technologist: Total assistance                  Extremity/Trunk Assessment Upper Extremity Assessment LUE Deficits / Details: L side weaker at baseline d/t CVA, Grip MMT grossly 4-/5            Vision Patient Visual Report: No change from baseline            Cognition Arousal/Alertness: Awake/alert Behavior During Therapy: Flat affect Overall Cognitive Status: Within Functional Limits for tasks assessed                                                     Pertinent Vitals/ Pain       Pain Assessment Pain Assessment: No/denies pain         Frequency  Min 2X/week        Progress Toward Goals  OT Goals(current goals can now be found in  the care plan section)  Progress towards OT goals: Progressing toward goals  Acute Rehab OT Goals Patient Stated Goal: to leave OT Goal Formulation: With patient Time For Goal Achievement: 11/09/21 Potential to Achieve Goals: Good  Plan Discharge plan remains appropriate;Frequency remains appropriate       AM-PAC OT "6 Clicks" Daily Activity     Outcome Measure   Help from another person eating meals?: A Little Help from another person taking care of personal grooming?: A Little Help from another person toileting, which includes using toliet, bedpan, or urinal?: A Little Help from another person bathing (including washing, rinsing, drying)?: A Little Help from another person to put on and taking off regular upper body clothing?: A Little Help from another person to put on and taking off regular lower body  clothing?: A Lot 6 Click Score: 17    End of Session Equipment Utilized During Treatment: Rolling walker (2 wheels)  OT Visit Diagnosis: Unsteadiness on feet (R26.81);Muscle weakness (generalized) (M62.81)   Activity Tolerance Patient limited by pain   Patient Left in bed;with call bell/phone within reach;with bed alarm set   Nurse Communication Mobility status        Time: 2094-7096 OT Time Calculation (min): 9 min  Charges: OT General Charges $OT Visit: 1 Visit OT Treatments $Therapeutic Activity: 8-22 mins  Darleen Crocker, MS, OTR/L , CBIS ascom [phone]  11/01/21, 1:02 PM

## 2021-11-01 NOTE — Progress Notes (Signed)
Physical Therapy Treatment Patient Details Name: Calvin [name] MRN: [id number] DOB: 1951-02-13 Today's Date: 11/01/2021   History of Present Illness Pt is a 71 y/o M admitted on 10/16/21 after presenting with c/c of N&V & abdominal pain. Pt is being treated for abdominal pain 2/2 partial SBO/ileus.   PMH: Ogilvie's syndrome, ileus, HTN, HLD, COPD, stroke with mild L sided weakness, GERD, gout, depression with anxiety, seizure, CAD, MI, alcohol abuse, CKD stage 3a, dCHF, colon CA, thyroid CA. Transferred to higher level of care on 2/20 for neostigmine administration, now with new PT/OT orders.    PT Comments    Pt resting in bed upon PT arrival; pt reports walking already today but agreeable to another walk with encouragement.  SBA with bed mobility; CGA with transfers using RW (with vc's for technique and increased effort/time to perform on own); and CGA to min assist to ambulate 200 feet with RW (pt requiring 3 short standing rest breaks on way back to room d/t fatigue)--vc's required for pt to stay closer to RW during ambulation.  Will continue to focus on strengthening, balance, and progressive functional mobility during hospitalization.    Recommendations for follow up therapy are one component of a multi-disciplinary discharge planning process, led by the attending physician.  Recommendations may be updated based on patient status, additional functional criteria and insurance authorization.  Follow Up Recommendations  Skilled nursing-short term rehab (<3 hours/day)     Assistance Recommended at Discharge Frequent or constant Supervision/Assistance  [name];[name]   Equipment Recommendations  Rolling walker (2 wheels)    Recommendations for Other Services       Precautions / Restrictions Precautions Precautions: Fall Precaution Comments: HOB > 30  degrees Restrictions Weight Bearing Restrictions: No     Mobility  Bed Mobility Overal bed mobility: Needs Assistance Bed Mobility: Supine to Sit, Sit to Supine     Supine to sit: Supervision, HOB elevated Sit to supine: Supervision, HOB elevated   General bed mobility comments: increased effort and time for pt to perform as much as possible on own    Transfers Overall transfer level: Needs assistance Equipment used: Rolling walker (2 wheels) Transfers: Sit to/from Stand Sit to Stand: Min guard           General transfer comment: increased effort and time for pt to perform on own; vc's to scoot forward towards edge of bed prior to standing    Ambulation/Gait Ambulation/Gait assistance: Min guard, Min assist Gait Distance (Feet): 200 Feet Assistive device: Rolling walker (2 wheels)   Gait velocity: decreased     General Gait Details: decreased stance time L LE; vc's to stay closer to RW; decreased B LE step length and foot clearance; assist for balance/safety   Stairs             Wheelchair Mobility    Modified Rankin (Stroke Patients Only)       Balance Overall balance assessment: Needs assistance Sitting-balance support: No upper extremity supported, Feet supported Sitting balance-Leahy Scale: Good Sitting balance - Comments: steady sitting reaching within BOS   Standing balance support: Single extremity supported Standing balance-Leahy Scale: Fair Standing balance comment: steady standing with at least single UE support                            Cognition Arousal/Alertness: Awake/alert Behavior During Therapy: Flat  affect Overall Cognitive Status: Within Functional Limits for tasks assessed                                          Exercises      General Comments        Pertinent Vitals/Pain Pain Assessment Faces Pain Scale: Hurts a little bit Pain Location: abdominal discomfort Pain Descriptors /  Indicators: Tender, Discomfort Pain Intervention(s): Limited activity within patient's tolerance, Monitored during session, Repositioned    Home Living                          Prior Function            PT Goals (current goals can now be found in the care plan section) Acute Rehab PT Goals Patient Stated Goal: to see if I can still walk PT Goal Formulation: With patient Time For Goal Achievement: 11/10/21 Potential to Achieve Goals: Good Progress towards PT goals: Progressing toward goals    Frequency    Min 2X/week      PT Plan Current plan remains appropriate    Co-evaluation              AM-PAC PT "6 Clicks" Mobility   Outcome Measure  Help needed turning from your back to your side while in a flat bed without using bedrails?: A Little Help needed moving from lying on your back to sitting on the side of a flat bed without using bedrails?: A Little Help needed moving to and from a bed to a chair (including a wheelchair)?: A Little Help needed standing up from a chair using your arms (e.g., wheelchair or bedside chair)?: A Little Help needed to walk in hospital room?: A Little Help needed climbing 3-5 steps with a railing? : A Lot 6 Click Score: 17    End of Session Equipment Utilized During Treatment: Gait belt Activity Tolerance: Patient tolerated treatment well Patient left: in bed;with call bell/phone within reach;with bed alarm set Nurse Communication: Mobility status (via white board) PT Visit Diagnosis: Muscle weakness (generalized) (M62.81);Unsteadiness on feet (R26.81)     Time: 3235-5732 PT Time Calculation (min) (ACUTE ONLY): 23 min  Charges:  $Gait Training: 8-22 mins $Therapeutic Activity: 8-22 mins                    Leitha Bleak, PT 11/01/21, 5:30 PM

## 2021-11-01 NOTE — Progress Notes (Signed)
Mobility Specialist - Progress Note   11/01/21 1000  Mobility  Activity Ambulated with assistance in hallway;Ambulated with assistance to bathroom  Level of Assistance Standby assist, set-up cues, supervision of patient - no hands on  Assistive Device Front wheel walker  Distance Ambulated (ft) 185 ft  Activity Response Tolerated well  $Mobility charge 1 Mobility    Pre-mobility: 105 HR During mobility: 132 HR, 90% SpO2 Post-mobility: 104 HR   Pt lying in bed upon arrival, utilizing RA. Min encouragement for ambulation as he states he already ambulated early this AM. No complaints initially. Extra time to achieve EOB. Pt ambulated in hallway with supervision---vc to bring RW closer to body. Follows cues intermittently. Fatigued with activity, 2 standing rest breaks taken. Pt ambulated to bathroom for BM prior to return to bed. Assist for peri-care. Pt declined transfer to chair at this time, returned to bed with alarm set and needs in reach. Voiced pain in back of neck extending towards shoulders prior to exit. RN notified.    Kathee Delton Mobility Specialist 11/01/21, 11:23 AM

## 2021-11-01 NOTE — Progress Notes (Signed)
PROGRESS NOTE    Calvin [name]  MEQ:[id number] DOB: [birth year] DOA: 10/16/2021 PCP: Pcp, No    Brief Narrative:  71 y.o. male with medical history significant of Ogilvie's syndrome, ileus, hypertension, hyperlipidemia, COPD, stroke with mild left-sided weakness, GERD, gout, depression with anxiety, seizure, CAD, MI,  alcohol abuse, CKD stage IIIa, dCHF, colon cancer, thyroid cancer, who presents with nausea vomiting, abdominal pain.    Patient is a poor historian, history is limited. Pt states that has been having abdominal pain for 2 days, associated with nausea, nonbilious nonbloody vomiting several times.  He also has abdominal distention.  Per his daughter (I called her daughter by phone), she noticed patient's abdominal distention several days ago.  Patient states that he has diarrhea, but per nurse observation, patient's stools are formed in the ED.  Patient does not have chest pain, cough, shortness of breath.  Patient has fever 100.4 in ED. patient continues to have ileus.  GI and general surgery following.Due to persistent ileus/pseudoobstruction, neostigmine was administered 2/20 with decent response.  Repeat dose again on 2/21 with poor response.  TPN initiated 2/21  GI performed decompressive therapy via colonoscope on 2/22.  Good result with immediate improvement in abdominal distention.  Patient more comfortable appearing on 2/23.  Neostigmine discontinued.  Discussed with GI.  Transfer back to medical floor.   Abdominal distention and pain had substantially improved after colonic decompression.  Patient has had bowel movements however abdominal distention is recurring now.  On 2/27repeat KUB demonstrates recurrent abdominal distention.  GI aware and will reevaluate for possible repeat colonic decompression versus potentially reengaging surgery.  As patient is continuing to have bowel movements and tolerate p.o. intake no current plans for repeat colonic decompression versus surgical  intervention.  On 2/28 patient noted that he did have some increasing pain and bili remains distended however he continues to tolerate food and BMs.    Assessment & Plan:   Principal Problem:   Abdominal pain Active Problems:   Seizures (HCC)   HTN (hypertension)   CAD (coronary artery disease)   COPD (chronic obstructive pulmonary disease) (HCC)   Depression with anxiety   Ogilvie's syndrome   UTI (urinary tract infection)   HLD (hyperlipidemia)   Stroke (HCC)   Sepsis (HCC)   Chronic kidney disease, stage 3a (HCC)   Tobacco abuse   Abdominal distention  Abdominal pain secondary to partial small bowel obstruction/ileus -Concern due to Ogilivie Syndrome from Narcotic use.  Persistent abdominal distention.  Repeat scans does not show any obvious obstruction.  Had good response to neostigmine on 2/20.  He had poor response to neostigmine therefore TPN was initiated. -GI and general surgery following. -Surgery signed off as no evidence of mechanical obstruction -Status post decompressive therapy via colonoscope on 2/22, good result -Patient remains highly unmotivated.  Refuses out of bed and mobility often.  Belly becoming more distended again on 2/26. -Patient did have BM x2 however belly remains very distended Plan: Continue soft diet as tolerated.  Continue twice daily MiraLAX.  Monitor for at least the next 24 hours overnight.  If patient has recurrent pain, intolerance of p.o., lack of BM reach back out to gastroenterology to consider repeat colonic decompression versus a possible surgical intervention.   History of Ogilvie syndrome Continue twice daily MiraLAX See above for remainder of plan   Possible UTI, felt unlikely Sepsis ruled out -Ucx grew Enteroccocus, On Ampicillin.  Completed 7-day course   CAD (coronary artery disease), no chest pain. -  Continue aspirin and statin   COPD (chronic obstructive pulmonary disease) ([place]): Stable -Bronchodilators as needed    Depression with anxiety PTA Zoloft and Xanax   HLD (hyperlipidemia) -Continue statin   HTN (hypertension) -Currently home medications are on hold.  IV hydralazine and Lopressor as needed   Seizure -Seizure precautions.  Currently on IV Keppra   History of Stroke ([place]) -ASA and zocor   Chronic kidney disease, stage 3a ([place]): stable -f/u by BMP   Tobacco abuse: -Nicotine patch  Possible cognitive decline Patient with poor insight into his disease process    DVT prophylaxis: SQ heparin Code Status: Full Family Communication: daughter [name] [phone] on 2/25.  Attempted to call again on 2/27.  No answer, voicemail full Disposition Plan: Status is: Inpatient Remains inpatient appropriate because: Persistent abdominal distention.  Patient continues to have BMs.  Will monitor in house for at least the next 24 hours and plan on discharge if exam remains reassuring versus reengaging GI for colonic decompression should patient's bowel movements stop  Level of care: Med-Surg  Consultants:  GI  Procedures:  Colonoscopy 2/22  Antimicrobials: None   Subjective: Patient seen and examined.  More pain this morning.  Continues to tolerate p.o. and had bowel movements.  Objective: Vitals:   10/31/21 2018 11/01/21 0440 11/01/21 0444 11/01/21 0756  BP: 110/75 126/79  116/83  Pulse: 95 82  86  Resp: 20 19    Temp: 99 F (37.2 C) 97.8 F (36.6 C)  97.8 F (36.6 C)  TempSrc: Oral     SpO2: 99% 97%  97%  Weight:   90.7 kg   Height:        Intake/Output Summary (Last 24 hours) at 11/01/2021 1322 Last data filed at 11/01/2021 1014 Gross per 24 hour  Intake 720 ml  Output 1000 ml  Net -280 ml   Filed Weights   10/30/21 0300 10/31/21 0500 11/01/21 0444  Weight: 94.1 kg 92.1 kg 90.7 kg    Examination:  General exam: No acute distress Respiratory system: Clear to auscultation. Respiratory effort normal. Cardiovascular system: S1-S2 RRR, no murmurs, no pedal  edema Gastrointestinal system: Distended.  Mild TTP.  Hypoactive bowel sounds Central nervous system: Alert and oriented. No focal neurological deficits. Extremities: Symmetric 5 x 5 power. Skin: No rashes, lesions or ulcers Psychiatry: Judgement and insight appear impaired. Mood & affect appropriate.     Data Reviewed: I have personally reviewed following labs and imaging studies  CBC: Recent Labs  Lab 10/26/21 0330  WBC 4.7  HGB 11.8*  HCT 36.4*  MCV 93.6  PLT 627   Basic Metabolic Panel: Recent Labs  Lab 10/26/21 0330 10/27/21 0558 10/27/21 0754 10/28/21 0408 10/29/21 0445 10/30/21 0703 10/31/21 0612 11/01/21 0517  NA 142 140  --  136 135 137 142 138  K 4.4 5.2*   < > 4.8 5.2* 4.9 5.2* 4.4  CL 109 107  --  103 101 106 104 102  CO2 27 26  --  24 25 25 26 26   GLUCOSE 108* 114*  --  122* 115* 110* 93 105*  BUN 8 14  --  27* 46* 37* 27* 25*  CREATININE 0.96 0.93  --  1.46* 1.85* 1.23 1.19 1.23  CALCIUM 9.0 9.2  --  9.3 9.3 9.3 9.7 9.9  MG 1.7 1.9  --   --   --   --   --   --   PHOS 4.0 3.9  --   --   --   --   --   --    < > =  values in this interval not displayed.   GFR: Estimated Creatinine Clearance: 58.7 mL/min (by C-G formula based on SCr of 1.23 mg/dL). Liver Function Tests: Recent Labs  Lab 10/26/21 0330  AST 19  ALT 22  ALKPHOS 47  BILITOT 0.3  PROT 6.1*  ALBUMIN 3.1*   No results for input(s): LIPASE, AMYLASE in the last 168 hours. No results for input(s): AMMONIA in the last 168 hours. Coagulation Profile: No results for input(s): INR, PROTIME in the last 168 hours. Cardiac Enzymes: No results for input(s): CKTOTAL, CKMB, CKMBINDEX, TROPONINI in the last 168 hours. BNP (last 3 results) No results for input(s): PROBNP in the last 8760 hours. HbA1C: No results for input(s): HGBA1C in the last 72 hours. CBG: Recent Labs  Lab 10/28/21 2258 10/29/21 0523 10/29/21 1140 10/29/21 1739 10/30/21 0634  GLUCAP 164* 99 99 112* 97   Lipid  Profile: No results for input(s): CHOL, HDL, LDLCALC, TRIG, CHOLHDL, LDLDIRECT in the last 72 hours.  Thyroid Function Tests: No results for input(s): TSH, T4TOTAL, FREET4, T3FREE, THYROIDAB in the last 72 hours. Anemia Panel: No results for input(s): VITAMINB12, FOLATE, FERRITIN, TIBC, IRON, RETICCTPCT in the last 72 hours. Sepsis Labs: No results for input(s): PROCALCITON, LATICACIDVEN in the last 168 hours.  Recent Results (from the past 240 hour(s))  Resp Panel by RT-PCR (Flu A&B, Covid) Nasopharyngeal Swab     Status: None   Collection Time: 10/31/21  8:11 AM   Specimen: Nasopharyngeal Swab; Nasopharyngeal(NP) swabs in vial transport medium  Result Value Ref Range Status   SARS Coronavirus 2 by RT PCR NEGATIVE NEGATIVE Final    Comment: (NOTE) SARS-CoV-2 target nucleic acids are NOT DETECTED.  The SARS-CoV-2 RNA is generally detectable in upper respiratory specimens during the acute phase of infection. The lowest concentration of SARS-CoV-2 viral copies this assay can detect is 138 copies/mL. A negative result does not preclude SARS-Cov-2 infection and should not be used as the sole basis for treatment or other patient management decisions. A negative result may occur with  improper specimen collection/handling, submission of specimen other than nasopharyngeal swab, presence of viral mutation(s) within the areas targeted by this assay, and inadequate number of viral copies(<138 copies/mL). A negative result must be combined with clinical observations, patient history, and epidemiological information. The expected result is Negative.  Fact Sheet for Patients:  EntrepreneurPulse.com.au  Fact Sheet for Healthcare Providers:  IncredibleEmployment.be  This test is no t yet approved or cleared by the [place] FDA and  has been authorized for detection and/or diagnosis of SARS-CoV-2 by FDA under an Emergency Use Authorization (EUA). This  EUA will remain  in effect (meaning this test can be used) for the duration of the COVID-19 declaration under Section 564(b)(1) of the Act, 21 U.S.C.section 360bbb-3(b)(1), unless the authorization is terminated  or revoked sooner.       Influenza A by PCR NEGATIVE NEGATIVE Final   Influenza B by PCR NEGATIVE NEGATIVE Final    Comment: (NOTE) The Xpert Xpress SARS-CoV-2/FLU/RSV plus assay is intended as an aid in the diagnosis of influenza from Nasopharyngeal swab specimens and should not be used as a sole basis for treatment. Nasal washings and aspirates are unacceptable for Xpert Xpress SARS-CoV-2/FLU/RSV testing.  Fact Sheet for Patients: EntrepreneurPulse.com.au  Fact Sheet for Healthcare Providers: IncredibleEmployment.be  This test is not yet approved or cleared by the [place] FDA and has been authorized for detection and/or diagnosis of SARS-CoV-2 by FDA under an Emergency Use Authorization (EUA). This  EUA will remain in effect (meaning this test can be used) for the duration of the COVID-19 declaration under Section 564(b)(1) of the Act, 21 U.S.C. section 360bbb-3(b)(1), unless the authorization is terminated or revoked.  Performed at [facility], [address] [place], [place] [address]           Radiology Studies: DG Abd 1 View  Result Date: 10/31/2021 CLINICAL DATA:  Abdominal pain, colonic distension, constipation EXAM: ABDOMEN - 1 VIEW COMPARISON:  10/30/2021 FINDINGS: No significant change in fairly pronounced gaseous distension of the colon measuring about 13 cm in diameter. Small bowel remains nondilated. Degenerative changes of the spine. Bones are osteopenic. Remote left hip arthroplasty changes, partially imaged. Aortoiliac and femoral atherosclerosis noted. IMPRESSION: Persistent colonic distension, favored to represent ileus. Electronically Signed   By: Jerilynn Mages.  Shick M.D.   On: 10/31/2021 13:22         Scheduled Meds:  ALPRAZolam  0.5 mg Oral BID   aspirin  81 mg Oral Daily   B-complex with vitamin C  1 tablet Oral Daily   calcium-vitamin D  1 tablet Oral BID   chewing gum (ORBIT) sugar free  1 Stick Oral TID   Chlorhexidine Gluconate Cloth  6 each Topical Daily   feeding supplement  237 mL Oral TID BM   gabapentin  800 mg Oral TID   heparin  5,000 Units Subcutaneous Q8H   levETIRAcetam  1,000 mg Oral Q12H   multivitamin with minerals  1 tablet Oral Daily   nicotine  21 mg Transdermal Daily   nystatin   Topical BID   pantoprazole (PROTONIX) IV  40 mg Intravenous Q24H   polyethylene glycol  17 g Oral BID   sertraline  50 mg Oral Daily   sodium chloride flush  10-40 mL Intracatheter Q12H   tamsulosin  0.4 mg Oral QPM   tiotropium  18 mcg Inhalation Daily   Continuous Infusions:  sodium chloride 10 mL/hr at 10/28/21 1039     LOS: 16 days       Sidney Ace, MD [facility]   If 7PM-7AM, please contact night-coverage  11/01/2021, 1:22 PM

## 2021-11-02 LAB — BASIC METABOLIC PANEL
Anion gap: 8 (ref 5–15)
BUN: 28 mg/dL — ABNORMAL HIGH (ref 8–23)
CO2: 27 mmol/L (ref 22–32)
Calcium: 9.5 mg/dL (ref 8.9–10.3)
Chloride: 105 mmol/L (ref 98–111)
Creatinine, Ser: 1.25 mg/dL — ABNORMAL HIGH (ref 0.61–1.24)
GFR, Estimated: >60 mL/min (ref >60)
Glucose, Bld: 114 mg/dL — ABNORMAL HIGH (ref 70–99)
Potassium: 4.1 mmol/L (ref 3.5–5.1)
Sodium: 140 mmol/L (ref 135–145)

## 2021-11-02 LAB — RESP PANEL BY RT-PCR (FLU A&B, COVID) ARPGX2
Influenza A by PCR: NEGATIVE
Influenza B by PCR: NEGATIVE
SARS Coronavirus 2 by RT PCR: NEGATIVE

## 2021-11-02 LAB — CBC
HCT: 37.5 % — ABNORMAL LOW (ref 39.0–52.0)
Hemoglobin: 11.8 g/dL — ABNORMAL LOW (ref 13.0–17.0)
MCH: 30.1 pg (ref 26.0–34.0)
MCHC: 31.5 g/dL (ref 30.0–36.0)
MCV: 95.7 fL (ref 80.0–100.0)
Platelets: 213 10*3/uL (ref 150–400)
RBC: 3.92 MIL/uL — ABNORMAL LOW (ref 4.22–5.81)
RDW: 13 % (ref 11.5–15.5)
WBC: 5.2 10*3/uL (ref 4.0–10.5)
nRBC: 0 % (ref 0.0–0.2)

## 2021-11-02 MED ORDER — POLYETHYLENE GLYCOL 3350 17 G PO PACK
34.0000 g | PACK | Freq: Every day | ORAL | 0 refills | Status: DC
Start: 2021-11-02 — End: 2022-06-30

## 2021-11-02 MED ORDER — FUROSEMIDE 20 MG PO TABS: 20.0000 mg | ORAL_TABLET | Freq: Every day | ORAL | 0 refills | Status: DC

## 2021-11-02 MED ORDER — HYDROCODONE-ACETAMINOPHEN 5-325 MG PO TABS: 0.5000 | ORAL_TABLET | Freq: Two times a day (BID) | ORAL | 0 refills | Status: DC

## 2021-11-02 NOTE — Discharge Summary (Signed)
Calvin [name] LKG:[id number] DOB: [birth year] DOA: 10/16/2021  PCP: Pcp, No  Admit date: 10/16/2021 Discharge date: 11/02/2021  Time spent: 40 minutes  Recommendations for Outpatient Follow-up:  GI f/u Attempt to wean off opioids    Discharge Diagnoses:  Principal Problem:   Abdominal pain Active Problems:   Seizures (HCC)   HTN (hypertension)   CAD (coronary artery disease)   COPD (chronic obstructive pulmonary disease) ([place])   Depression with anxiety   Ogilvie's syndrome   UTI (urinary tract infection)   HLD (hyperlipidemia)   Stroke (HCC)   Sepsis ([place])   Chronic kidney disease, stage 3a ([place])   Tobacco abuse   Abdominal distention   Discharge Condition: stable  Diet recommendation: heart healthy, high fiber  Filed Weights   10/31/21 0500 11/01/21 0444 11/02/21 0500  Weight: 92.1 kg 90.7 kg 90.2 kg    History of present illness:  Calvin WHITTLEY is a 71 y.o. male with medical history significant of Ogilvie's syndrome, ileus, hypertension, hyperlipidemia, COPD, stroke with mild left-sided weakness, GERD, gout, depression with anxiety, seizure, CAD, MI,  alcohol abuse, CKD stage IIIa, dCHF, colon cancer, thyroid cancer, who presents with nausea vomiting, abdominal pain.    Patient is a poor historian, history is limited. Pt states that has been having abdominal pain for 2 days, associated with nausea, nonbilious nonbloody vomiting several times.  He also has abdominal distention.  Per his daughter (I called her daughter by phone), she noticed patient's abdominal distention several days ago.  Patient states that he has diarrhea, but per nurse observation, patient's stools are formed in the ED.  Patient does not have chest pain, cough, shortness of breath.  Patient has fever 100.4 in ED.    Hospital Course:  Patient presented with ileus/ogilvie syndrome likely 2/2 chronic opioids. Extended hospital course for persistent abdominal distention/pseudoobstruction. Gen surg  consulted, deemed not to be a surgical problem. GI followed, patient responded to colonoscope decompression. Was treated with neostigmine and initially had good response and then failed to respond so that was ultimately held. Patient did require TPN for some time but has not been transitioned back to a regular diet. Abdomen remains distended but patient is without pain and is having bowel movements and passing flatus. GI advises weaning opioids, continuing bowel regimen, and outpatient f/u. Patient also treated here for enterococcus uti. His chronic medical problems (CAD, COPD, MDD, seizure disorder, prior CVA, htn, and CKD 3a) were all stable. He will return to long-term care.   Procedures: colonoscopy   Consultations: GI, gen surg  Discharge Exam: Vitals:   11/02/21 0405 11/02/21 0728  BP: 126/81 129/81  Pulse: 88 84  Resp:    Temp: (!) 97.4 F (36.3 C) 97.7 F (36.5 C)  SpO2: 100% 97%    General exam: No acute distress Respiratory system: Clear to auscultation. Respiratory effort normal. Cardiovascular system: S1-S2 RRR, no murmurs, no pedal edema Gastrointestinal system: Distended.  no  TTP.  normal bowel sounds Central nervous system: Alert and oriented. No focal neurological deficits. Extremities: Symmetric 5 x 5 power. Skin: No rashes, lesions or ulcers Psychiatry: Judgement and insight appear impaired. Mood & affect appropriate.   Discharge Instructions   Discharge Instructions     Diet - low sodium heart healthy   Complete by: As directed    Increase activity slowly   Complete by: As directed       Allergies as of 11/02/2021   No Known Allergies      Medication  List     TAKE these medications    acetaminophen 325 MG tablet Commonly known as: TYLENOL Take 650 mg by mouth every 6 (six) hours.   ALPRAZolam 0.25 MG tablet Commonly known as: XANAX Take 0.25 mg by mouth daily.   ALPRAZolam 0.5 MG tablet Commonly known as: XANAX Take 0.5 mg by mouth 2  (two) times daily.   alum & mag hydroxide-simeth 200-200-20 MG/5ML suspension Commonly known as: MAALOX/MYLANTA Take 30 mLs by mouth every 12 (twelve) hours as needed for indigestion or heartburn.   aspirin 81 MG chewable tablet Take 81 mg by mouth daily.   B-complex with vitamin C tablet Take 1 tablet by mouth daily.   BENEFIBER DRINK MIX PO Take 5 g by mouth daily.   Biofreeze 4 % Gel Generic drug: Menthol (Topical Analgesic) Apply 1 application topically daily. (Apply to left side of face and body)   bisacodyl 10 MG suppository Commonly known as: DULCOLAX Place 10 mg rectally in the morning.   bisacodyl 5 MG EC tablet Commonly known as: DULCOLAX Take 5 mg by mouth daily.   Calcium 600+D Plus Minerals 600-400 MG-UNIT Tabs Take 1 tablet by mouth 2 (two) times daily.   Cepacol Sore Throat & Cough 5-7.5 MG Lozg Generic drug: Dextromethorphan-Benzocaine Use as directed 1 lozenge in the mouth or throat every 3 (three) hours as needed (sore throat).   clindamycin-benzoyl peroxide gel Commonly known as: BENZACLIN Apply 1 application topically every morning.   furosemide 20 MG tablet Commonly known as: LASIX Take 1 tablet (20 mg total) by mouth daily. What changed: how much to take   gabapentin 100 MG capsule Commonly known as: NEURONTIN Take 100 mg by mouth in the morning. (Take only with morning 800mg  dose to equal 900mg  total)   gabapentin 400 MG capsule Commonly known as: NEURONTIN Take 2 capsules (800 mg total) by mouth 3 (three) times daily.   guaiFENesin 600 MG 12 hr tablet Commonly known as: MUCINEX Take 600 mg by mouth 2 (two) times daily.   HYDROcodone-acetaminophen 5-325 MG tablet Commonly known as: NORCO/VICODIN Take 0.5 tablets by mouth every 12 (twelve) hours. What changed: how much to take   lactulose 10 GM/15ML solution Commonly known as: CHRONULAC Take 20 g by mouth daily.   levETIRAcetam 500 MG tablet Commonly known as: KEPPRA Take 1,000  mg by mouth 2 (two) times daily.   Lidocaine 4 % Ptch Apply 1 patch topically at bedtime. Apply to left side of neck daily at bedtime for 5 days   Lidocaine HCl 4 % Crea Apply 1 application topically daily. Apply from left side of face, down to left side of body to left foot   lisinopril 5 MG tablet Commonly known as: ZESTRIL Take 1 tablet (5 mg total) by mouth daily.   metoprolol succinate 25 MG 24 hr tablet Commonly known as: TOPROL-XL Take 25 mg by mouth daily.   nitroGLYCERIN 0.4 MG SL tablet Commonly known as: NITROSTAT Place 0.4 mg under the tongue every 5 (five) minutes as needed for chest pain.   nystatin powder Commonly known as: MYCOSTATIN/NYSTOP Apply 1 application topically 2 (two) times daily as needed (groin rash).   Omega-3 1000 MG Caps Take 1,000 mg by mouth daily.   ondansetron 4 MG disintegrating tablet Commonly known as: ZOFRAN-ODT Take 4 mg by mouth every 8 (eight) hours as needed for nausea or vomiting.   pantoprazole 20 MG tablet Commonly known as: PROTONIX Take 20 mg by mouth in the morning.  polyethylene glycol 17 g packet Commonly known as: MIRALAX / GLYCOLAX Take 34 g by mouth daily. What changed: how much to take   senna 8.6 MG Tabs tablet Commonly known as: SENOKOT Take 1 tablet (8.6 mg total) by mouth at bedtime. What changed: when to take this   sertraline 50 MG tablet Commonly known as: ZOLOFT Take 50 mg by mouth daily.   simvastatin 10 MG tablet Commonly known as: ZOCOR Take 10 mg by mouth at bedtime.   tamsulosin 0.4 MG Caps capsule Commonly known as: FLOMAX Take 0.4 mg by mouth every evening.   tiotropium 18 MCG inhalation capsule Commonly known as: SPIRIVA Place 18 mcg into inhaler and inhale daily.   Vitamin B-Complex Tabs Take 1 tablet by mouth daily.       No Known Allergies  Contact information for after-discharge care     Destination     [facility] .   Service: Skilled  Nursing Contact information: [address] [place] [phone]                      The results of significant diagnostics from this hospitalization (including imaging, microbiology, ancillary and laboratory) are listed below for reference.    Significant Diagnostic Studies: CT ABDOMEN PELVIS WO CONTRAST  Result Date: 10/22/2021 CLINICAL DATA:  Evaluate for bowel perforation or peritonitis. EXAM: CT ABDOMEN AND PELVIS WITHOUT CONTRAST TECHNIQUE: Multidetector CT imaging of the abdomen and pelvis was performed following the standard protocol without IV contrast. RADIATION DOSE REDUCTION: This exam was performed according to the departmental dose-optimization program which includes automated exposure control, adjustment of the mA and/or kV according to patient size and/or use of iterative reconstruction technique. COMPARISON:  10/16/2021 FINDINGS: Lower chest: Trace right pleural effusion with overlying atelectasis. Mild subsegmental atelectasis identified in the left base. Hepatobiliary: There is no suspicious liver abnormality. Several calcified granulomas noted. Small gallstones are noted layering within the dependent portion of the gallbladder. No signs of gallbladder wall inflammation or bile duct dilatation. Pancreas: Unremarkable. No pancreatic ductal dilatation or surrounding inflammatory changes. Spleen: Normal in size without focal abnormality. Adrenals/Urinary Tract: Bilateral low-attenuation adrenal nodules are unchanged from 09/20/2019 compatible with benign adenomas. Bilateral renal cortical scarring noted. No nephrolithiasis, mass, or hydronephrosis. No hydroureter identified. Circumferential wall thickening involving the partially decompressed urinary bladder noted. Stomach/Bowel: Stomach appears nondistended. No small bowel wall thickening, inflammation, or distension. The appendix is visualized and appears within normal limits, image 63/2. There is  diffuse colonic distension from the proximal cecum up to the mid rectum. This has maximum diameter 9.8 cm, image 36/3. This is compared with 9.5 cm on 10/16/2021. There is an abrupt transition within the mid rectum, image 92/3. Here, there is marked decreased luminal narrowing with suspected underlying colonic circumferential apple-core lesion measuring approximately 2.6 by 2.6 cm, image 88/4 and image 79/2. This is approximately 6 cm from the anal verge, image 87/4. Some enteric contrast material is noted distal to this short segment. Vascular/Lymphatic: Aortic atherosclerosis. No aneurysm. No abdominopelvic adenopathy. Reproductive: Prostate is unremarkable. Other: No free fluid or fluid collections identified. No pneumoperitoneum identified. Musculoskeletal: No acute or suspicious osseous findings. The bones appear diffusely osteopenic. Previous left hip arthroplasty. IMPRESSION: 1. There is diffuse colonic distension from the proximal cecum up to the mid rectum. There is an abrupt transition within the mid rectum with suspected underlying circumferential apple-core lesion measuring approximately 2.6 cm from the anal verge. Findings  are concerning for primary colonic neoplasm. Further investigation with colonoscopy is recommended. 2. No evidence for bowel perforation or abscess. 3. Gallstones. 4. Trace right pleural effusion with overlying atelectasis. 5. Aortic Atherosclerosis (ICD10-I70.0). Electronically Signed   By: Kerby Moors M.D.   On: 10/22/2021 14:48   DG Abd 1 View  Result Date: 10/31/2021 CLINICAL DATA:  Abdominal pain, colonic distension, constipation EXAM: ABDOMEN - 1 VIEW COMPARISON:  10/30/2021 FINDINGS: No significant change in fairly pronounced gaseous distension of the colon measuring about 13 cm in diameter. Small bowel remains nondilated. Degenerative changes of the spine. Bones are osteopenic. Remote left hip arthroplasty changes, partially imaged. Aortoiliac and femoral  atherosclerosis noted. IMPRESSION: Persistent colonic distension, favored to represent ileus. Electronically Signed   By: Jerilynn Mages.  Shick M.D.   On: 10/31/2021 13:22   DG Abd 1 View  Result Date: 10/30/2021 CLINICAL DATA:  Abdominal distension, history of Ogilvie's EXAM: ABDOMEN - 1 VIEW COMPARISON:  10/26/2021 FINDINGS: Recurrence of significant gaseous distension of colon. Distal descending colon measures up to 13 cm. Small bowel appears to be normal in caliber. No acute osseous abnormality. IMPRESSION: Recurrent gaseous distension of the colon. Electronically Signed   By: Macy Mis M.D.   On: 10/30/2021 11:41   DG Abd 1 View  Result Date: 10/26/2021 CLINICAL DATA:  Abdominal pain EXAM: ABDOMEN - 1 VIEW COMPARISON:  Previous studies including the examination done earlier today FINDINGS: Bowel gas pattern is nonspecific. There is interval resolution of gaseous distention of colon. Small to moderate amount of stool is seen in right colon. There is no fecal impaction in the rectosigmoid. There is previous left hip arthroplasty. IMPRESSION: Nonspecific bowel gas pattern. There is interval resolution of gaseous distention of colon. Electronically Signed   By: Elmer Picker M.D.   On: 10/26/2021 17:34   DG Abd 1 View  Result Date: 10/26/2021 CLINICAL DATA:  Follow-up of ileus EXAM: ABDOMEN - 1 VIEW COMPARISON:  Yesterday FINDINGS: Two supine views of the abdomen and pelvis. Right hemidiaphragm elevation. Cardiomegaly. Gas filled colon, with the index transverse colonic diameter of 12.8 cm versus 10.3 cm on the prior. Descending/sigmoid colon at 8.8 cm, similar. No free intraperitoneal air or gaseous distension of small bowel loops. Left hip arthroplasty. IMPRESSION: Slight increase in gaseous distension of the colon, suggesting colonic ileus. Electronically Signed   By: Abigail Miyamoto M.D.   On: 10/26/2021 09:15   DG Abd 1 View  Result Date: 10/25/2021 CLINICAL DATA:  Follow-up ileus. EXAM: ABDOMEN -  1 VIEW COMPARISON:  Study of 10/21/2021. FINDINGS: Gaseous distention throughout the large bowel is still seen up to 10 cm in the transverse segment, but slightly improved with previous transverse colonic diameter 12.1 cm. No dilated small bowel is seen. There is no supine evidence for free air. No abnormal masses or calcifications are seen aside from pelvic vascular calcifications. There is an asymmetrically elevated right hemidiaphragm, with the lung bases clear. Bladder is catheterized. IMPRESSION: Colonic gas dilatation is still seen, maximum up to 10 cm in the transverse segment, but with improvement with previous maximum diameter 12.1 cm. In all other respects no further changes. Electronically Signed   By: Telford Nab M.D.   On: 10/25/2021 06:11   DG Abd 1 View  Result Date: 10/21/2021 CLINICAL DATA:  Abdominal distention EXAM: ABDOMEN - 1 VIEW COMPARISON:  Previous studies including the examination done on 10/19/2021 FINDINGS: There is marked gaseous distention of colon measuring up to 10 cm in diameter. There  is oral contrast administered for previous CT in the the right colon and rectum. There is no abnormal dilation of small-bowel loops. There is no gastric distention. No abnormal masses or calcifications are seen. Kidneys are partly obscured by bowel contents. Subsegmental atelectasis is seen in the right lower lung fields. IMPRESSION: Gaseous distention of colon has not changed significantly suggesting ileus. Linear densities in the right lower lung fields suggest subsegmental atelectasis. Electronically Signed   By: Elmer Picker M.D.   On: 10/21/2021 13:31   DG Abd 1 View  Result Date: 10/19/2021 CLINICAL DATA:  Abdominal pain.  Ileus. EXAM: ABDOMEN - 1 VIEW COMPARISON:  None. FINDINGS: There is marked gaseous distension of multiple loops of the colon with index loop of the splenic flexure of the colon measuring at least 11 cm in diameter. No significant gaseous distension of the  small bowel. No supine evidence of pneumoperitoneum. No pneumatosis or portal venous gas. Limited visualization of the lower thorax demonstrates mild elevation right hemidiaphragm. Vascular calcifications overlie the lower pelvis bilaterally. Post left bipolar hip replacement, incompletely evaluated. Degenerative change the lower lumbar spine is suspected though incompletely evaluated. IMPRESSION: Marked distension of the colon most suggestive of provided clinical suspicion of ileus. Electronically Signed   By: Sandi Mariscal M.D.   On: 10/19/2021 08:39   DG Abd 1 View  Result Date: 10/18/2021 CLINICAL DATA:  Abdominal pain EXAM: ABDOMEN - 1 VIEW COMPARISON:  10/16/2021 FINDINGS: Two supine views of the abdomen and pelvis. Right hemidiaphragm elevation. Removal of nasogastric tube. Cardiomegaly. Gas-filled colon. Minimal distal gas. This may obscure dilated small bowel loops. No gross free intraperitoneal air. No abnormal abdominal calcifications. IMPRESSION: Gas-filled colon, suggesting adynamic ileus versus less likely distal obstruction. Electronically Signed   By: Abigail Miyamoto M.D.   On: 10/18/2021 18:15   DG Abdomen 1 View  Result Date: 10/16/2021 CLINICAL DATA:  Nasogastric tube placement. EXAM: ABDOMEN - 1 VIEW COMPARISON:  08/10/2019. FINDINGS: Nasal/orogastric tube passes below the diaphragm, tip in the proximal stomach, side hole likely above the GE junction. Recommend further insertion for more optimal positioning. Increased bowel gas with no overt bowel dilation. IMPRESSION: 1. Nasal/orogastric tube tip lies in the proximal stomach. Recommend inserting another 10 cm for more optimal positioning. Electronically Signed   By: Lajean Manes M.D.   On: 10/16/2021 15:27   CT PELVIS WO CONTRAST  Result Date: 10/22/2021 CLINICAL DATA:  Question of rectal lesion on CT scan earlier today. EXAM: CT PELVIS WITHOUT CONTRAST TECHNIQUE: Multidetector CT imaging of the pelvis was performed following the  standard protocol without intravenous contrast. RADIATION DOSE REDUCTION: This exam was performed according to the departmental dose-optimization program which includes automated exposure control, adjustment of the mA and/or kV according to patient size and/or use of iterative reconstruction technique. COMPARISON:  CT scan from earlier today. FINDINGS: Urinary Tract:  Unremarkable. Bowel: As noted on prior studies, there is diffuse gaseous dilatation of colon. The area of focal wall thickening and luminal narrowing seen in the rectum earlier today is less prominent on the supine scan. Patient was also scanned in the prone position to allow this portion of the colon to become non dependent and filled with gas. Prone imaging is demonstrated on series 12 and the rectum is diffusely well distended without wall thickening or obstructing lesion. Sagittal reconstructions (see image 97 of series 14) show good distension of the sigmoid colon and rectum to the level of the anal verge. Vascular/Lymphatic: Atherosclerotic calcification noted in the  distal aorta and common iliac arteries. No pelvic sidewall lymphadenopathy. Reproductive: The prostate gland and seminal vesicles are unremarkable. Other:  No intraperitoneal free fluid. Musculoskeletal: Status post left total hip replacement. IMPRESSION: 1. The area of focal wall thickening and luminal narrowing seen in the rectum earlier today is less prominent on the repeat supine imaging and resolves completely with prone imaging. Sigmoid colon and rectum is well distended on the prone imaging. There is no rectal soft tissue mass or evidence of rectal stricture. 2. Diffuse gaseous dilatation of the colon. 3. Status post left total hip replacement. 4. Aortic Atherosclerosis (ICD10-I70.0). Electronically Signed   By: Misty Stanley M.D.   On: 10/22/2021 17:46   CT ABDOMEN PELVIS W CONTRAST  Result Date: 10/16/2021 CLINICAL DATA:  Acute, nonlocalized abdominal pain EXAM: CT  ABDOMEN AND PELVIS WITH CONTRAST TECHNIQUE: Multidetector CT imaging of the abdomen and pelvis was performed using the standard protocol following bolus administration of intravenous contrast. RADIATION DOSE REDUCTION: This exam was performed according to the departmental dose-optimization program which includes automated exposure control, adjustment of the mA and/or kV according to patient size and/or use of iterative reconstruction technique. CONTRAST:  74mL OMNIPAQUE IOHEXOL 300 MG/ML  SOLN COMPARISON:  09/20/2019 FINDINGS: Lower chest: Elevated right diaphragm with scarring or atelectasis at the base. Coronary atherosclerosis. Hepatobiliary: No focal liver abnormality.No evidence of biliary obstruction or stone. Pancreas: Unremarkable. Spleen: Unremarkable. Adrenals/Urinary Tract: Negative adrenals. Patchy bilateral renal cortical scarring. No hydronephrosis. Unremarkable bladder. Stomach/Bowel: Diffuse gas and fluid distention of the colon, also seen on prior. Changes of sigmoidectomy. Gas and fluid distended stomach and proximal small bowel without discrete transition point. No bowel wall thickening /inflammation. Vascular/Lymphatic: Scattered atheromatous calcifications. Negative for mass or adenopathy. No mass or adenopathy. Reproductive:No pathologic findings, partially obscured pelvis from streak artifact. Other: No ascites or pneumoperitoneum. Musculoskeletal: No acute abnormalities.  Left hip arthroplasty. IMPRESSION: 1. Fluid distended stomach and proximal small bowel suggesting partial obstruction, although a discrete transition point is not seen. Suggest follow-up radiographs. 2. Noncontiguous diffuse distention of colon with fluid levels, also seen on 2021 comparison, question risk factors for chronic diarrheal illness or ileus. Electronically Signed   By: Jorje Guild M.D.   On: 10/16/2021 08:33   US RENAL  Result Date: 10/29/2021 CLINICAL DATA:  Renal dysfunction EXAM: RENAL / URINARY TRACT  ULTRASOUND COMPLETE COMPARISON:  None. FINDINGS: Right Kidney: Renal measurements: 8.6 x 4.1 x 4.4 cm = volume: 81.9 mL. There is no hydronephrosis. There is increased cortical echogenicity. Left Kidney: Renal measurements: 9.7 x 4.5 x 3.5 cm = volume: 80 mL. There is increased cortical echogenicity. There is no hydronephrosis. Bladder: Unremarkable. Other: None. IMPRESSION: There is no hydronephrosis. There is increased cortical echogenicity in renal cortex on both sides suggesting medical renal disease. Electronically Signed   By: Elmer Picker M.D.   On: 10/29/2021 10:34   DG Chest Port 1 View  Result Date: 10/16/2021 CLINICAL DATA:  Sepsis workup. EXAM: PORTABLE CHEST 1 VIEW COMPARISON:  Portable chest 10/02/2021. FINDINGS: There are multiple overlying monitor wires. Stable mediastinum with aortic calcification and atherosclerosis. No vascular congestion is seen. Heart is slightly enlarged. Visualized lung fields are clear apart from right perihilar increased linear atelectasis. There is limited visualization of the right lower lung field due to again noted elevated right hemidiaphragm. Thoracic cage is intact. IMPRESSION: Increased right perihilar linear atelectasis with no evidence of acute chest disease or further interval changes. Limited view of the right base due to chronic  elevation of the right diaphragm. Electronically Signed   By: Telford Nab M.D.   On: 10/16/2021 06:10   DG Abd Portable 1V-Small Bowel Obstruction Protocol-initial, 8 hr delay  Result Date: 10/19/2021 CLINICAL DATA:  SBO, 8 hr delay films EXAM: PORTABLE ABDOMEN - 1 VIEW COMPARISON:  X-ray abdomen 10/18/2021. FINDINGS: PO contrast reaches the rectum. Persistent marked gaseous dilatation of the colon. No radio-opaque calculi or other significant radiographic abnormality are seen. IMPRESSION: PO contrast reaches the rectum. Persistent marked gaseous dilatation of the colon. Electronically Signed   By: Iven Finn M.D.    On: 10/19/2021 20:50   [place] EKG SITE RITE  Result Date: 10/25/2021 If [facility] image not attached, placement could not be confirmed due to current cardiac rhythm.   Microbiology: Recent Results (from the past 240 hour(s))  Resp Panel by RT-PCR (Flu A&B, Covid) Nasopharyngeal Swab     Status: None   Collection Time: 10/31/21  8:11 AM   Specimen: Nasopharyngeal Swab; Nasopharyngeal(NP) swabs in vial transport medium  Result Value Ref Range Status   SARS Coronavirus 2 by RT PCR NEGATIVE NEGATIVE Final    Comment: (NOTE) SARS-CoV-2 target nucleic acids are NOT DETECTED.  The SARS-CoV-2 RNA is generally detectable in upper respiratory specimens during the acute phase of infection. The lowest concentration of SARS-CoV-2 viral copies this assay can detect is 138 copies/mL. A negative result does not preclude SARS-Cov-2 infection and should not be used as the sole basis for treatment or other patient management decisions. A negative result may occur with  improper specimen collection/handling, submission of specimen other than nasopharyngeal swab, presence of viral mutation(s) within the areas targeted by this assay, and inadequate number of viral copies(<138 copies/mL). A negative result must be combined with clinical observations, patient history, and epidemiological information. The expected result is Negative.  Fact Sheet for Patients:  EntrepreneurPulse.com.au  Fact Sheet for Healthcare Providers:  IncredibleEmployment.be  This test is no t yet approved or cleared by the [place] FDA and  has been authorized for detection and/or diagnosis of SARS-CoV-2 by FDA under an Emergency Use Authorization (EUA). This EUA will remain  in effect (meaning this test can be used) for the duration of the COVID-19 declaration under Section 564(b)(1) of the Act, 21 U.S.C.section 360bbb-3(b)(1), unless the authorization is terminated  or revoked sooner.        Influenza A by PCR NEGATIVE NEGATIVE Final   Influenza B by PCR NEGATIVE NEGATIVE Final    Comment: (NOTE) The Xpert Xpress SARS-CoV-2/FLU/RSV plus assay is intended as an aid in the diagnosis of influenza from Nasopharyngeal swab specimens and should not be used as a sole basis for treatment. Nasal washings and aspirates are unacceptable for Xpert Xpress SARS-CoV-2/FLU/RSV testing.  Fact Sheet for Patients: EntrepreneurPulse.com.au  Fact Sheet for Healthcare Providers: IncredibleEmployment.be  This test is not yet approved or cleared by the [place] FDA and has been authorized for detection and/or diagnosis of SARS-CoV-2 by FDA under an Emergency Use Authorization (EUA). This EUA will remain in effect (meaning this test can be used) for the duration of the COVID-19 declaration under Section 564(b)(1) of the Act, 21 U.S.C. section 360bbb-3(b)(1), unless the authorization is terminated or revoked.  Performed at [facility], [address] [place], [place] [address]      Labs: Basic Metabolic Panel: Recent Labs  Lab 10/27/21 5361 10/27/21 4431 10/29/21 0445 10/30/21 0703 10/31/21 0612 11/01/21 0517 11/02/21 0404  NA 140   < > 135  137 142 138 140  K 5.2*   < > 5.2* 4.9 5.2* 4.4 4.1  CL 107   < > 101 106 104 102 105  CO2 26   < > 25 25 26 26 27   GLUCOSE 114*   < > 115* 110* 93 105* 114*  BUN 14   < > 46* 37* 27* 25* 28*  CREATININE 0.93   < > 1.85* 1.23 1.19 1.23 1.25*  CALCIUM 9.2   < > 9.3 9.3 9.7 9.9 9.5  MG 1.9  --   --   --   --   --   --   PHOS 3.9  --   --   --   --   --   --    < > = values in this interval not displayed.   Liver Function Tests: No results for input(s): AST, ALT, ALKPHOS, BILITOT, PROT, ALBUMIN in the last 168 hours. No results for input(s): LIPASE, AMYLASE in the last 168 hours. No results for input(s): AMMONIA in the last 168 hours. CBC: Recent Labs  Lab 11/02/21 0404  WBC  5.2  HGB 11.8*  HCT 37.5*  MCV 95.7  PLT 213   Cardiac Enzymes: No results for input(s): CKTOTAL, CKMB, CKMBINDEX, TROPONINI in the last 168 hours. BNP: BNP (last 3 results) Recent Labs    10/16/21 0659  BNP 46.4    ProBNP (last 3 results) No results for input(s): PROBNP in the last 8760 hours.  CBG: Recent Labs  Lab 10/28/21 2258 10/29/21 0523 10/29/21 1140 10/29/21 1739 10/30/21 0634  GLUCAP 164* 99 99 112* 97       Signed:  Desma Maxim MD.  [facility] 11/02/2021, 11:23 AM

## 2021-11-02 NOTE — Progress Notes (Signed)
Physical Therapy Treatment ?Patient Details ?Name: Calvin [name] ?MRN: [id number] ?DOB: September 19, 1950 ?Today's Date: 11/02/2021 ? ? ?History of Present Illness Pt is a 71 y/o M admitted on 10/16/21 after presenting with c/c of N&V & abdominal pain. Pt is being treated for abdominal pain 2/2 partial SBO/ileus.   PMH: Ogilvie's syndrome, ileus, HTN, HLD, COPD, stroke with mild L sided weakness, GERD, gout, depression with anxiety, seizure, CAD, MI, alcohol abuse, CKD stage 3a, dCHF, colon CA, thyroid CA. Transferred to higher level of care on 2/20 for neostigmine administration, now with new PT/OT orders. ? ?  ?PT Comments  ? ? Pt was long sitting in bed upon arriving. He is A and O x 3. Presents with flat affect but is cooperative and willing to fully participate. Pt lives at [facility] in [place]. He reports they usually let him walk around the halls on his own. Pt was able to achieve EOB short sit with increased time and heavy use of bed rail. Took min assist at first to stand to RW however on 2nd attempt, was able to perform with CGA. He tolerated ambulate 200 ft with +2 standing rest. Author adjusted RW height for proper fit. Overall pt is progressing but will continue to benefit from skilled PT to progress to PLOF.  ?   ?Recommendations for follow up therapy are one component of a multi-disciplinary discharge planning process, led by the attending physician.  Recommendations may be updated based on patient status, additional functional criteria and insurance authorization. ? ?Follow Up Recommendations ? Skilled nursing-short term rehab (<3 hours/day) ?  ?  ?Assistance Recommended at Discharge Frequent or constant Supervision/Assistance  ?[name];[name] ?  ?Equipment Recommendations ? None recommended by PT  ?  ?   ?Precautions / Restrictions Precautions ?Precautions: Fall ?Precaution Comments: HOB > 30  degrees ?Restrictions ?Weight Bearing Restrictions: No  ?  ? ?Mobility ? Bed Mobility ?Overal bed mobility: Needs Assistance ?Bed Mobility: Supine to Sit, Sit to Supine ?  ?  ?Supine to sit: Supervision, HOB elevated ?Sit to supine: Supervision, HOB elevated ?  ?General bed mobility comments: increased effort required with Vcs for improved technique. pt does use bedrail to assist. ?  ? ?Transfers ?Overall transfer level: Needs assistance ?Equipment used: Rolling walker (2 wheels) ?Transfers: Sit to/from Stand ?Sit to Stand: Min assist ?  ?  ?  ?  ?  ?General transfer comment: pt struggles to stand from lowest bed height. min assist on first attempt however CGA on 2nd. Pt rwquired vcs for handplacement and incraesed fwd wt shift. ?  ? ?Ambulation/Gait ?Ambulation/Gait assistance: Min guard ?Gait Distance (Feet): 200 Feet ?Assistive device: Rolling walker (2 wheels) ?Gait Pattern/deviations: Decreased step length - right, Decreased step length - left, Decreased stride length, Decreased dorsiflexion - right, Decreased dorsiflexion - left ?Gait velocity: decreased ?  ?  ?General Gait Details: Pt has hx of CVA x 2. L side chronic deficits still present. Once fatigued, LLE tends to drag slightly. ? ? ?  ?Balance Overall balance assessment: Needs assistance ?Sitting-balance support: No upper extremity supported, Feet supported ?Sitting balance-Leahy Scale: Good ?  ?  ?Standing balance support: Bilateral upper extremity supported, During functional activity, Reliant on assistive device for balance ?Standing balance-Leahy Scale: Fair ?  ?   ?Cognition Arousal/Alertness: Awake/alert ?Behavior During Therapy: Flat affect ?Overall Cognitive Status: Within Functional Limits for tasks assessed ?  ?   ?  General Comments: Oriented to basic information, follows commands. ?  ?  ? ?  ?   ?   ? ?Pertinent Vitals/Pain Pain Assessment ?Pain Assessment: No/denies pain ?Faces Pain Scale: No hurt  ? ? ? ?PT Goals (current goals can now be  found in the care plan section) Acute Rehab PT Goals ?Patient Stated Goal: return to my rehab ?Progress towards PT goals: Progressing toward goals ? ?  ?Frequency ? ? ? Min 2X/week ? ? ? ?  ?PT Plan Current plan remains appropriate  ? ? ?   ?AM-PAC PT "6 Clicks" Mobility   ?Outcome Measure ? Help needed turning from your back to your side while in a flat bed without using bedrails?: A Little ?Help needed moving from lying on your back to sitting on the side of a flat bed without using bedrails?: A Little ?Help needed moving to and from a bed to a chair (including a wheelchair)?: A Little ?Help needed standing up from a chair using your arms (e.g., wheelchair or bedside chair)?: A Little ?Help needed to walk in hospital room?: A Little ?Help needed climbing 3-5 steps with a railing? : A Lot ?6 Click Score: 17 ? ?  ?End of Session   ?Activity Tolerance: Patient tolerated treatment well ?Patient left: in bed;with call bell/phone within reach;with bed alarm set ?Nurse Communication: Mobility status ?PT Visit Diagnosis: Muscle weakness (generalized) (M62.81);Unsteadiness on feet (R26.81) ?  ? ? ?Time: 7017-7939 ?PT Time Calculation (min) (ACUTE ONLY): 14 min ? ?Charges:  $Gait Training: 8-22 mins          ?          ? ?Julaine Fusi PTA ?11/02/21, 11:43 AM  ? ?

## 2021-11-02 NOTE — Care Management Important Message (Signed)
Important Message ? ?Patient Details  ?Name: Calvin [name] ?MRN: [id number] ?Date of Birth: December 25, 1950 ? ? ?Medicare Important Message Given:  Yes ? ? ? ? ?Dannette Barbara ?11/02/2021, 12:18 PM ?

## 2021-11-02 NOTE — Progress Notes (Signed)
Mobility Specialist - Progress Note ? ? 11/02/21 1200  ?Mobility  ?Activity Ambulated with assistance in room;Stood at bedside;Transferred to/from [facility]  ?Level of Assistance Modified independent, requires aide device or extra time  ?Assistive Device Standard walker  ?Distance Ambulated (ft) 15 ft  ?Activity Response Tolerated well  ?$Mobility charge 1 Mobility  ? ? ?Pt using bathroom upon arrival with RA, RN in room. Pt completed STS with supervision --- BM noted. Pt ambulated 15 ft to EOB --- no complaints voiced. Pt left with alarms set and needs in reach. RN notified of session. ? ?Calvin [name] ?Mobility Specialist ?11/02/21, 12:11 PM ? ? ? ? ?

## 2021-11-02 NOTE — TOC Transition Note (Signed)
Transition of Care (TOC) - CM/SW Discharge Note ? ? ?Patient Details  ?Name: Calvin [name] ?MRN: [id number] ?Date of Birth: [birth year] ? ?Transition of Care (TOC) CM/SW Contact:  ?Beverly Sessions, RN ?Phone Number: ?11/02/2021, 1:19 PM ? ? ?Clinical Narrative:    ?Ems called 2nd on the list for pick up ? ? ?  ?Barriers to Discharge: Continued Medical Work up ? ? ?Patient Goals and CMS Choice ?  ?  ?Choice offered to / list presented to : NA ? ?Discharge Placement ?  ?           ?  ?  ?  ?  ? ?Discharge Plan and Services ?  ?  ?Post Acute Care Choice: Resumption of Svcs/PTA Provider          ?  ?  ?  ?  ?  ?  ?  ?  ?  ?  ? ?Social Determinants of Health (SDOH) Interventions ?  ? ? ?Readmission Risk Interventions ?Readmission Risk Prevention Plan 10/17/2021 08/08/2019  ?Transportation Screening Complete Complete  ?PCP or Specialist Appt within 3-5 Days Complete -  ?[place] or [place] - Not Complete  ?[place] or Home Care Consult comments - not indicated from LTC  ?Social Work Consult for [place] Planning/Counseling Complete -  ?Palliative Care Screening Not Applicable -  ?Medication Review Press photographer) Complete Complete  ?Some recent data might be hidden  ? ? ? ? ? ?

## 2021-11-02 NOTE — TOC Transition Note (Signed)
Transition of Care (TOC) - CM/SW Discharge Note ? ? ?Patient Details  ?Name: Calvin [name] ?MRN: [id number] ?Date of Birth: 03-Nov-1950 ? ?Transition of Care (TOC) CM/SW Contact:  ?Beverly Sessions, RN ?Phone Number: ?11/02/2021, 12:03 PM ? ? ?Clinical Narrative:    ?Patient will DC SU:GAYGEFU ?Anticipated DC date: 11/02/21 ? ?Family notified:Sister ?Transport by: [facility] ? ?Per MD patient ready for DC to . RN, patient, patient's family, and facility notified of DC. Discharge Summary sent to facility. RN given number for report. DC packet on chart.  ? ?Covid test pending ?Will arrange EMS transport after results  ? ? ?Isaias Cowman RNCM ?[phone] ? ? ? ?  ?Barriers to Discharge: Continued Medical Work up ? ? ?Patient Goals and CMS Choice ?  ?  ?Choice offered to / list presented to : NA ? ?Discharge Placement ?  ?           ?  ?  ?  ?  ? ?Discharge Plan and Services ?  ?  ?Post Acute Care Choice: Resumption of Svcs/PTA Provider          ?  ?  ?  ?  ?  ?  ?  ?  ?  ?  ? ?Social Determinants of Health (SDOH) Interventions ?  ? ? ?Readmission Risk Interventions ?Readmission Risk Prevention Plan 10/17/2021 08/08/2019  ?Transportation Screening Complete Complete  ?PCP or Specialist Appt within 3-5 Days Complete -  ?[place] or [place] - Not Complete  ?[place] or Home Care Consult comments - not indicated from LTC  ?Social Work Consult for [place] Planning/Counseling Complete -  ?Palliative Care Screening Not Applicable -  ?Medication Review Press photographer) Complete Complete  ?Some recent data might be hidden  ? ? ? ? ? ?

## 2022-01-10 ENCOUNTER — Emergency Department: Payer: Medicare Other

## 2022-01-10 ENCOUNTER — Emergency Department
Admission: EM | Admit: 2022-01-10 | Discharge: 2022-01-10 | Disposition: A | Discharge status: Home / Self Care | Payer: Medicare Other | Attending: Student in an Organized Health Care Education/Training Program | Admitting: Student in an Organized Health Care Education/Training Program

## 2022-01-10 ENCOUNTER — Other Ambulatory Visit: Payer: Self-pay

## 2022-01-10 DIAGNOSIS — E876 Hypokalemia: Secondary | ICD-10-CM | POA: Diagnosis not present

## 2022-01-10 DIAGNOSIS — R140 Abdominal distension (gaseous): Secondary | ICD-10-CM | POA: Diagnosis present

## 2022-01-10 LAB — URINALYSIS, ROUTINE W REFLEX MICROSCOPIC
Bilirubin Urine: NEGATIVE
Glucose, UA: NEGATIVE mg/dL
Hgb urine dipstick: NEGATIVE
Ketones, ur: NEGATIVE mg/dL
Nitrite: NEGATIVE
Protein, ur: NEGATIVE mg/dL
Specific Gravity, Urine: 1.013 (ref 1.005–1.030)
pH: 5 (ref 5.0–8.0)

## 2022-01-10 LAB — CBC
HCT: 42.9 % (ref 39.0–52.0)
Hemoglobin: 13.2 g/dL (ref 13.0–17.0)
MCH: 30.5 pg (ref 26.0–34.0)
MCHC: 30.8 g/dL (ref 30.0–36.0)
MCV: 99.1 fL (ref 80.0–100.0)
Platelets: 186 10*3/uL (ref 150–400)
RBC: 4.33 MIL/uL (ref 4.22–5.81)
RDW: 12.6 % (ref 11.5–15.5)
WBC: 7.3 10*3/uL (ref 4.0–10.5)
nRBC: 0 % (ref 0.0–0.2)

## 2022-01-10 LAB — COMPREHENSIVE METABOLIC PANEL
ALT: 22 U/L (ref 0–44)
AST: 25 U/L (ref 15–41)
Albumin: 4 g/dL (ref 3.5–5.0)
Alkaline Phosphatase: 59 U/L (ref 38–126)
Anion gap: 7 (ref 5–15)
BUN: 20 mg/dL (ref 8–23)
CO2: 21 mmol/L — ABNORMAL LOW (ref 22–32)
Calcium: 9.2 mg/dL (ref 8.9–10.3)
Chloride: 111 mmol/L (ref 98–111)
Creatinine, Ser: 1.27 mg/dL — ABNORMAL HIGH (ref 0.61–1.24)
GFR, Estimated: >60 mL/min (ref >60)
Glucose, Bld: 98 mg/dL (ref 70–99)
Potassium: 5.6 mmol/L — ABNORMAL HIGH (ref 3.5–5.1)
Sodium: 139 mmol/L (ref 135–145)
Total Bilirubin: 0.5 mg/dL (ref 0.3–1.2)
Total Protein: 7.5 g/dL (ref 6.5–8.1)

## 2022-01-10 LAB — BASIC METABOLIC PANEL
Anion gap: 4 — ABNORMAL LOW (ref 5–15)
BUN: 18 mg/dL (ref 8–23)
CO2: 25 mmol/L (ref 22–32)
Calcium: 9.3 mg/dL (ref 8.9–10.3)
Chloride: 111 mmol/L (ref 98–111)
Creatinine, Ser: 1.23 mg/dL (ref 0.61–1.24)
GFR, Estimated: >60 mL/min (ref >60)
Glucose, Bld: 94 mg/dL (ref 70–99)
Potassium: 5.2 mmol/L — ABNORMAL HIGH (ref 3.5–5.1)
Sodium: 140 mmol/L (ref 135–145)

## 2022-01-10 LAB — LIPASE, BLOOD: Lipase: 32 U/L (ref 11–51)

## 2022-01-10 MED ORDER — IOHEXOL 300 MG/ML  SOLN
100.0000 mL | Freq: Once | INTRAMUSCULAR | Status: AC | PRN
  Administered 2022-01-10: 100 mL via INTRAVENOUS
  Filled 2022-01-10: qty 100

## 2022-01-10 MED ORDER — SODIUM CHLORIDE 0.9 % IV BOLUS
500.0000 mL | Freq: Once | INTRAVENOUS | Status: AC
Start: 2022-01-10 — End: 2022-01-10
  Administered 2022-01-10: 500 mL via INTRAVENOUS

## 2022-01-10 NOTE — ED Notes (Signed)
Attempted PIV x 2, unsuccessful. Was able to obtain lab samples for processing and send light green/lavender tubes to lab. ?

## 2022-01-10 NOTE — ED Triage Notes (Signed)
Pt comes via EMs from [facility]. Pt denies any complaints. Pt was sent out due to distended abdomen. Pt does have hx of bowel obstructions. ?

## 2022-01-10 NOTE — ED Notes (Signed)
Received a call from NP Mala from [facility], she said the patient Trotter just arriving to you for abd distention should not have come facility should have called her and kept him there.  She said for MD or RN to give her a call  [phone] ?

## 2022-01-10 NOTE — Discharge Instructions (Addendum)
IMPRESSION: ?1. Persistent diffuse distension of the colon from the cecum up to ?the level of the rectum. The transverse colon measures 9.9 cm in ?diameter. This is compared with the same on 10/22/2021. 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 ?2. Gallstones. ?3. Bilateral renal cortical scarring. ?4. Age indeterminate T7 vertebral body compression deformity ?5. Aortic Atherosclerosis (ICD10-I70.0). ?6. Chronic postinflammatory changes are noted within both lower lung ?zones. Correlate for any clinical signs or symptoms of recurrent ?aspiration. ?

## 2022-01-10 NOTE — ED Provider Notes (Signed)
? ?[facility] ?Provider Note ? ? ? Event Date/Time  ? First MD Initiated Contact with Patient 01/10/22 1029   ?  (approximate) ? ? ?History  ? ?Abdominal Pain ? ? ?HPI ? ?Calvin [name] is a 71 y.o. male   presents to the ER from facility due to concern for obstruction.  Patient saw a poor historian not providing additional history denies any pain or discomfort.  No nausea or vomiting. ? ?  ? ? ?Physical Exam  ? ?Triage Vital Signs: ?ED Triage Vitals  ?Enc Vitals Group  ?   BP 01/10/22 1002 99/65  ?   Pulse Rate 01/10/22 1002 65  ?   Resp 01/10/22 1002 16  ?   Temp 01/10/22 1002 98.3 ?F (36.8 ?C)  ?   Temp src --   ?   SpO2 01/10/22 1002 95 %  ?   Weight --   ?   Height --   ?   Head Circumference --   ?   Peak Flow --   ?   Pain Score 01/10/22 0956 0  ?   Pain Loc --   ?   Pain Edu? --   ?   Excl. in [place]? --   ? ? ?Most recent vital signs: ?Vitals:  ? 01/10/22 1002 01/10/22 1314  ?BP: 99/65 100/60  ?Pulse: 65 68  ?Resp: 16 16  ?Temp: 98.3 ?F (36.8 ?C)   ?SpO2: 95% 96%  ? ? ? ?Constitutional: Alert  ?Eyes: Conjunctivae are normal.  ?Head: Atraumatic. ?Nose: No congestion/rhinnorhea. ?Mouth/Throat: Mucous membranes are moist.   ?Neck: Painless ROM.  ?Cardiovascular:   Good peripheral circulation. ?Respiratory: Normal respiratory effort.  No retractions.  ?Gastrointestinal: Soft and nontender.  Distended ?Musculoskeletal:  no deformity ?Neurologic:  MAE spontaneously. No gross focal neurologic deficits are appreciated.  ?Skin:  Skin is warm, dry and intact. No rash noted. ?Psychiatric: Mood and affect are normal. Speech and behavior are normal. ? ? ? ?ED Results / Procedures / Treatments  ? ?Labs ?(all labs ordered are listed, but only abnormal results are displayed) ?Labs Reviewed  ?COMPREHENSIVE METABOLIC PANEL - Abnormal; Notable for the following components:  ?    Result Value  ? Potassium 5.6 (*)   ? CO2 21 (*)   ? Creatinine, Ser 1.27 (*)   ? All other components within normal limits   ?URINALYSIS, ROUTINE W REFLEX MICROSCOPIC - Abnormal; Notable for the following components:  ? Color, Urine YELLOW (*)   ? APPearance CLEAR (*)   ? Leukocytes,Ua TRACE (*)   ? Bacteria, UA RARE (*)   ? All other components within normal limits  ?CBC  ?LIPASE, BLOOD  ?BASIC METABOLIC PANEL  ? ? ? ?EKG ? ? ? ? ?RADIOLOGY ?Please see ED Course for my review and interpretation. ? ?I personally reviewed all radiographic images ordered to evaluate for the above acute complaints and reviewed radiology reports and findings.  These findings were personally discussed with the patient.  Please see medical record for radiology report. ? ? ? ?PROCEDURES: ? ?Critical Care performed:  ? ?Procedures ? ? ?MEDICATIONS ORDERED IN ED: ?Medications  ?sodium chloride 0.9 % bolus 500 mL (500 mLs Intravenous New Bag/Given 01/10/22 1346)  ?iohexol (OMNIPAQUE) 300 MG/ML solution 100 mL (100 mLs Intravenous Contrast Given 01/10/22 1428)  ? ? ? ?IMPRESSION / MDM / ASSESSMENT AND PLAN / ED COURSE  ?I reviewed the triage vital signs and the nursing notes. ?             ?               ? ?  Differential diagnosis includes, but is not limited to, obstruction, perforation, diverticulitis, colitis, hernia ? ?Patient presented to the ER for evaluation of abdominal distention as described above.  Patient nontoxic-appearing somewhat of a poor historian.  Blood work is without any leukocytosis borderline elevation of potassium will give IV fluids and recheck.  Will order CT abd/pel. ? ? ? ?Clinical Course as of 01/10/22 1516  ?Tue Jan 10, 2022  ?1420 Chest x-ray on my review and interpretation without any evidence of pneumothorax. [PR]  ?[place], NP working at the patient's facility and states that they had not wanted patient be transferred to the ER.  States that he typically manages this with IV fluids if needed and has had small bowel obstruction in the past.  States the nurses called EMS prior to consulting their providers there. [PR]  ?  ?Clinical  Course User Index ?[PR] Merlyn Lot, MD  ? ?Patient with signout oncoming physician pending repeat BMP.  CT imaging without significant changes from prior suggesting that the patient has Ogilvie syndrome.  He remains nontoxic-appearing ? ?FINAL CLINICAL IMPRESSION(S) / ED DIAGNOSES  ? ?Final diagnoses:  ?Abdominal distension  ? ? ? ?Rx / DC Orders  ? ?ED Discharge Orders   ? ? None  ? ?  ? ? ? ?Note:  This document was prepared using Dragon voice recognition software and may include unintentional dictation errors. ? ?  ?Merlyn Lot, MD ?01/10/22 1517 ? ?

## 2022-01-10 NOTE — ED Notes (Signed)
Pt endorses abdominal pain and reports hx Colon cancer and recurrent bowel obstructions. Uses Miralax daily and last BM was yesterday, normal per pt. Bowel sounds clearly audible. Pt notes that he is most concerned about a severe headache that started around 3-4am this morning. He has had some dizziness upon standing as well but says that this has happened before and does not feel different from his baseline. Pt seems to be neurologically intact and is able to answer questions without difficulty.  ?

## 2022-01-10 NOTE — ED Notes (Signed)
NP called Magnus Sinning [phone]  call her about patient when assigned provider  Has pertinent information on patient  ?

## 2022-05-28 ENCOUNTER — Inpatient Hospital Stay: Payer: Medicare Other

## 2022-05-28 ENCOUNTER — Emergency Department: Payer: Medicare Other

## 2022-05-28 ENCOUNTER — Other Ambulatory Visit: Payer: Self-pay

## 2022-05-28 ENCOUNTER — Inpatient Hospital Stay
Admission: EM | Admit: 2022-05-28 | Discharge: 2022-06-30 | DRG: 329 | Disposition: A | Discharge status: Skilled Nursing Facility | Payer: Medicare Other | Source: Skilled Nursing Facility | Attending: Internal Medicine | Admitting: Internal Medicine

## 2022-05-28 DIAGNOSIS — J449 Chronic obstructive pulmonary disease, unspecified: Secondary | ICD-10-CM | POA: Diagnosis present

## 2022-05-28 DIAGNOSIS — F32A Depression, unspecified: Secondary | ICD-10-CM | POA: Diagnosis present

## 2022-05-28 DIAGNOSIS — I700 Atherosclerosis of aorta: Secondary | ICD-10-CM | POA: Diagnosis present

## 2022-05-28 DIAGNOSIS — I4892 Unspecified atrial flutter: Secondary | ICD-10-CM | POA: Diagnosis present

## 2022-05-28 DIAGNOSIS — E872 Acidosis, unspecified: Secondary | ICD-10-CM | POA: Diagnosis not present

## 2022-05-28 DIAGNOSIS — R519 Headache, unspecified: Secondary | ICD-10-CM

## 2022-05-28 DIAGNOSIS — K56609 Unspecified intestinal obstruction, unspecified as to partial versus complete obstruction: Principal | ICD-10-CM | POA: Insufficient documentation

## 2022-05-28 DIAGNOSIS — I10 Essential (primary) hypertension: Secondary | ICD-10-CM | POA: Diagnosis not present

## 2022-05-28 DIAGNOSIS — G709 Myoneural disorder, unspecified: Secondary | ICD-10-CM | POA: Diagnosis present

## 2022-05-28 DIAGNOSIS — R1310 Dysphagia, unspecified: Secondary | ICD-10-CM | POA: Diagnosis present

## 2022-05-28 DIAGNOSIS — D631 Anemia in chronic kidney disease: Secondary | ICD-10-CM | POA: Diagnosis present

## 2022-05-28 DIAGNOSIS — I2510 Atherosclerotic heart disease of native coronary artery without angina pectoris: Secondary | ICD-10-CM | POA: Diagnosis present

## 2022-05-28 DIAGNOSIS — Z79891 Long term (current) use of opiate analgesic: Secondary | ICD-10-CM

## 2022-05-28 DIAGNOSIS — J690 Pneumonitis due to inhalation of food and vomit: Secondary | ICD-10-CM | POA: Diagnosis not present

## 2022-05-28 DIAGNOSIS — Z8249 Family history of ischemic heart disease and other diseases of the circulatory system: Secondary | ICD-10-CM

## 2022-05-28 DIAGNOSIS — Z1152 Encounter for screening for COVID-19: Secondary | ICD-10-CM | POA: Diagnosis not present

## 2022-05-28 DIAGNOSIS — G9341 Metabolic encephalopathy: Secondary | ICD-10-CM | POA: Diagnosis not present

## 2022-05-28 DIAGNOSIS — K5939 Other megacolon: Secondary | ICD-10-CM | POA: Diagnosis present

## 2022-05-28 DIAGNOSIS — I5032 Chronic diastolic (congestive) heart failure: Secondary | ICD-10-CM | POA: Diagnosis present

## 2022-05-28 DIAGNOSIS — I639 Cerebral infarction, unspecified: Secondary | ICD-10-CM

## 2022-05-28 DIAGNOSIS — N189 Chronic kidney disease, unspecified: Secondary | ICD-10-CM | POA: Diagnosis not present

## 2022-05-28 DIAGNOSIS — Z515 Encounter for palliative care: Secondary | ICD-10-CM | POA: Diagnosis not present

## 2022-05-28 DIAGNOSIS — K567 Ileus, unspecified: Secondary | ICD-10-CM | POA: Diagnosis present

## 2022-05-28 DIAGNOSIS — K8020 Calculus of gallbladder without cholecystitis without obstruction: Secondary | ICD-10-CM | POA: Diagnosis present

## 2022-05-28 DIAGNOSIS — E785 Hyperlipidemia, unspecified: Secondary | ICD-10-CM | POA: Diagnosis not present

## 2022-05-28 DIAGNOSIS — R109 Unspecified abdominal pain: Secondary | ICD-10-CM | POA: Diagnosis not present

## 2022-05-28 DIAGNOSIS — R2990 Unspecified symptoms and signs involving the nervous system: Secondary | ICD-10-CM | POA: Diagnosis not present

## 2022-05-28 DIAGNOSIS — F418 Other specified anxiety disorders: Secondary | ICD-10-CM | POA: Diagnosis present

## 2022-05-28 DIAGNOSIS — R0603 Acute respiratory distress: Secondary | ICD-10-CM | POA: Diagnosis not present

## 2022-05-28 DIAGNOSIS — I2583 Coronary atherosclerosis due to lipid rich plaque: Secondary | ICD-10-CM | POA: Diagnosis not present

## 2022-05-28 DIAGNOSIS — Z993 Dependence on wheelchair: Secondary | ICD-10-CM

## 2022-05-28 DIAGNOSIS — Z23 Encounter for immunization: Secondary | ICD-10-CM

## 2022-05-28 DIAGNOSIS — Z6830 Body mass index (BMI) 30.0-30.9, adult: Secondary | ICD-10-CM | POA: Diagnosis not present

## 2022-05-28 DIAGNOSIS — Z9049 Acquired absence of other specified parts of digestive tract: Secondary | ICD-10-CM

## 2022-05-28 DIAGNOSIS — F17210 Nicotine dependence, cigarettes, uncomplicated: Secondary | ICD-10-CM | POA: Diagnosis present

## 2022-05-28 DIAGNOSIS — R471 Dysarthria and anarthria: Secondary | ICD-10-CM | POA: Diagnosis not present

## 2022-05-28 DIAGNOSIS — I130 Hypertensive heart and chronic kidney disease with heart failure and stage 1 through stage 4 chronic kidney disease, or unspecified chronic kidney disease: Secondary | ICD-10-CM | POA: Diagnosis present

## 2022-05-28 DIAGNOSIS — I480 Paroxysmal atrial fibrillation: Secondary | ICD-10-CM | POA: Diagnosis present

## 2022-05-28 DIAGNOSIS — K920 Hematemesis: Secondary | ICD-10-CM | POA: Diagnosis present

## 2022-05-28 DIAGNOSIS — R569 Unspecified convulsions: Secondary | ICD-10-CM | POA: Diagnosis not present

## 2022-05-28 DIAGNOSIS — K5981 Ogilvie syndrome: Principal | ICD-10-CM | POA: Diagnosis present

## 2022-05-28 DIAGNOSIS — M1990 Unspecified osteoarthritis, unspecified site: Secondary | ICD-10-CM | POA: Diagnosis present

## 2022-05-28 DIAGNOSIS — K219 Gastro-esophageal reflux disease without esophagitis: Secondary | ICD-10-CM | POA: Diagnosis present

## 2022-05-28 DIAGNOSIS — I6502 Occlusion and stenosis of left vertebral artery: Secondary | ICD-10-CM | POA: Diagnosis present

## 2022-05-28 DIAGNOSIS — Z79899 Other long term (current) drug therapy: Secondary | ICD-10-CM

## 2022-05-28 DIAGNOSIS — Z7982 Long term (current) use of aspirin: Secondary | ICD-10-CM

## 2022-05-28 DIAGNOSIS — G8929 Other chronic pain: Secondary | ICD-10-CM | POA: Diagnosis present

## 2022-05-28 DIAGNOSIS — Z96642 Presence of left artificial hip joint: Secondary | ICD-10-CM | POA: Diagnosis present

## 2022-05-28 DIAGNOSIS — N179 Acute kidney failure, unspecified: Secondary | ICD-10-CM | POA: Diagnosis not present

## 2022-05-28 DIAGNOSIS — I252 Old myocardial infarction: Secondary | ICD-10-CM

## 2022-05-28 DIAGNOSIS — Z85038 Personal history of other malignant neoplasm of large intestine: Secondary | ICD-10-CM

## 2022-05-28 DIAGNOSIS — Z720 Tobacco use: Secondary | ICD-10-CM | POA: Diagnosis not present

## 2022-05-28 DIAGNOSIS — M4802 Spinal stenosis, cervical region: Secondary | ICD-10-CM | POA: Diagnosis present

## 2022-05-28 DIAGNOSIS — Z01818 Encounter for other preprocedural examination: Secondary | ICD-10-CM

## 2022-05-28 DIAGNOSIS — R079 Chest pain, unspecified: Secondary | ICD-10-CM | POA: Diagnosis not present

## 2022-05-28 DIAGNOSIS — G629 Polyneuropathy, unspecified: Secondary | ICD-10-CM | POA: Diagnosis not present

## 2022-05-28 DIAGNOSIS — K449 Diaphragmatic hernia without obstruction or gangrene: Secondary | ICD-10-CM | POA: Diagnosis present

## 2022-05-28 DIAGNOSIS — G9389 Other specified disorders of brain: Secondary | ICD-10-CM | POA: Diagnosis present

## 2022-05-28 DIAGNOSIS — Z7189 Other specified counseling: Secondary | ICD-10-CM | POA: Diagnosis not present

## 2022-05-28 DIAGNOSIS — G40909 Epilepsy, unspecified, not intractable, without status epilepticus: Secondary | ICD-10-CM

## 2022-05-28 DIAGNOSIS — N1831 Chronic kidney disease, stage 3a: Secondary | ICD-10-CM | POA: Diagnosis present

## 2022-05-28 DIAGNOSIS — Z789 Other specified health status: Secondary | ICD-10-CM | POA: Diagnosis not present

## 2022-05-28 DIAGNOSIS — Z85850 Personal history of malignant neoplasm of thyroid: Secondary | ICD-10-CM

## 2022-05-28 DIAGNOSIS — I69354 Hemiplegia and 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 side: Secondary | ICD-10-CM | POA: Diagnosis not present

## 2022-05-28 DIAGNOSIS — R29810 Facial weakness: Secondary | ICD-10-CM | POA: Diagnosis not present

## 2022-05-28 DIAGNOSIS — F419 Anxiety disorder, unspecified: Secondary | ICD-10-CM | POA: Diagnosis present

## 2022-05-28 LAB — CBC
HCT: 45.6 % (ref 39.0–52.0)
Hemoglobin: 14.2 g/dL (ref 13.0–17.0)
MCH: 29.9 pg (ref 26.0–34.0)
MCHC: 31.1 g/dL (ref 30.0–36.0)
MCV: 96 fL (ref 80.0–100.0)
Platelets: 210 10*3/uL (ref 150–400)
RBC: 4.75 MIL/uL (ref 4.22–5.81)
RDW: 12.3 % (ref 11.5–15.5)
WBC: 9.6 10*3/uL (ref 4.0–10.5)
nRBC: 0 % (ref 0.0–0.2)

## 2022-05-28 LAB — COMPREHENSIVE METABOLIC PANEL
ALT: 17 U/L (ref 0–44)
AST: 28 U/L (ref 15–41)
Albumin: 4.2 g/dL (ref 3.5–5.0)
Alkaline Phosphatase: 64 U/L (ref 38–126)
Anion gap: 11 (ref 5–15)
BUN: 13 mg/dL (ref 8–23)
CO2: 22 mmol/L (ref 22–32)
Calcium: 9.2 mg/dL (ref 8.9–10.3)
Chloride: 107 mmol/L (ref 98–111)
Creatinine, Ser: 1.18 mg/dL (ref 0.61–1.24)
GFR, Estimated: >60 mL/min (ref >60)
Glucose, Bld: 122 mg/dL — ABNORMAL HIGH (ref 70–99)
Potassium: 4.6 mmol/L (ref 3.5–5.1)
Sodium: 140 mmol/L (ref 135–145)
Total Bilirubin: 1 mg/dL (ref 0.3–1.2)
Total Protein: 7.8 g/dL (ref 6.5–8.1)

## 2022-05-28 LAB — PROTIME-INR
INR: 1 (ref 0.8–1.2)
Prothrombin Time: 13.5 seconds (ref 11.4–15.2)

## 2022-05-28 LAB — CBC WITH DIFFERENTIAL/PLATELET
Abs Immature Granulocytes: 0.04 10*3/uL (ref 0.00–0.07)
Basophils Absolute: 0 10*3/uL (ref 0.0–0.1)
Basophils Relative: 0 %
Eosinophils Absolute: 0 10*3/uL (ref 0.0–0.5)
Eosinophils Relative: 0 %
HCT: 47 % (ref 39.0–52.0)
Hemoglobin: 14.9 g/dL (ref 13.0–17.0)
Immature Granulocytes: 0 %
Lymphocytes Relative: 9 %
Lymphs Abs: 0.8 10*3/uL (ref 0.7–4.0)
MCH: 30.3 pg (ref 26.0–34.0)
MCHC: 31.7 g/dL (ref 30.0–36.0)
MCV: 95.5 fL (ref 80.0–100.0)
Monocytes Absolute: 0.4 10*3/uL (ref 0.1–1.0)
Monocytes Relative: 4 %
Neutro Abs: 8.5 10*3/uL — ABNORMAL HIGH (ref 1.7–7.7)
Neutrophils Relative %: 87 %
Platelets: 206 10*3/uL (ref 150–400)
RBC: 4.92 MIL/uL (ref 4.22–5.81)
RDW: 12.3 % (ref 11.5–15.5)
WBC: 9.8 10*3/uL (ref 4.0–10.5)
nRBC: 0 % (ref 0.0–0.2)

## 2022-05-28 LAB — TROPONIN I (HIGH SENSITIVITY)
Troponin I (High Sensitivity): 8 ng/L (ref <18)
Troponin I (High Sensitivity): 8 ng/L (ref <18)

## 2022-05-28 LAB — RESP PANEL BY RT-PCR (FLU A&B, COVID) ARPGX2
Influenza A by PCR: NEGATIVE
Influenza B by PCR: NEGATIVE
SARS Coronavirus 2 by RT PCR: NEGATIVE

## 2022-05-28 LAB — URINALYSIS, ROUTINE W REFLEX MICROSCOPIC
Bilirubin Urine: NEGATIVE
Glucose, UA: NEGATIVE mg/dL
Ketones, ur: NEGATIVE mg/dL
Nitrite: NEGATIVE
Protein, ur: 100 mg/dL — AB
Specific Gravity, Urine: 1.015 (ref 1.005–1.030)
pH: 5.5 (ref 5.0–8.0)

## 2022-05-28 LAB — APTT: aPTT: 31 seconds (ref 24–36)

## 2022-05-28 LAB — URINALYSIS, MICROSCOPIC (REFLEX)

## 2022-05-28 LAB — LACTIC ACID, PLASMA: Lactic Acid, Venous: 1.2 mmol/L (ref 0.5–1.9)

## 2022-05-28 LAB — HEMOGLOBIN A1C
Hgb A1c MFr Bld: 5.8 % — ABNORMAL HIGH (ref 4.8–5.6)
Mean Plasma Glucose: 119.76 mg/dL

## 2022-05-28 LAB — CBG MONITORING, ED: Glucose-Capillary: 99 mg/dL (ref 70–99)

## 2022-05-28 LAB — BRAIN NATRIURETIC PEPTIDE: B Natriuretic Peptide: 74.7 pg/mL (ref 0.0–100.0)

## 2022-05-28 LAB — MAGNESIUM: Magnesium: 2.3 mg/dL (ref 1.7–2.4)

## 2022-05-28 LAB — LIPASE, BLOOD: Lipase: 30 U/L (ref 11–51)

## 2022-05-28 MED ORDER — SENNOSIDES-DOCUSATE SODIUM 8.6-50 MG PO TABS
1.0000 | ORAL_TABLET | Freq: Every evening | ORAL | Status: DC | PRN
  Filled 2022-05-28: qty 1

## 2022-05-28 MED ORDER — ACETAMINOPHEN 160 MG/5ML PO SOLN
650.0000 mg | ORAL | Status: DC | PRN
  Administered 2022-05-31 – 2022-06-03 (×5): 650 mg
  Filled 2022-05-28 (×6): qty 20.3

## 2022-05-28 MED ORDER — ALBUTEROL SULFATE HFA 108 (90 BASE) MCG/ACT IN AERS: 2.0000 | INHALATION_SPRAY | RESPIRATORY_TRACT | Status: DC | PRN

## 2022-05-28 MED ORDER — SODIUM CHLORIDE 0.9 % IV BOLUS
1000.0000 mL | Freq: Once | INTRAVENOUS | Status: AC
  Administered 2022-05-28: 1000 mL via INTRAVENOUS

## 2022-05-28 MED ORDER — ALBUTEROL SULFATE (2.5 MG/3ML) 0.083% IN NEBU
2.5000 mg | INHALATION_SOLUTION | RESPIRATORY_TRACT | Status: DC | PRN
  Administered 2022-05-31: 2.5 mg via RESPIRATORY_TRACT
  Filled 2022-05-28: qty 3

## 2022-05-28 MED ORDER — ACETAMINOPHEN 325 MG RE SUPP: 650.0000 mg | Freq: Four times a day (QID) | RECTAL | Status: DC | PRN

## 2022-05-28 MED ORDER — LEVETIRACETAM IN NACL 1000 MG/100ML IV SOLN
1000.0000 mg | Freq: Two times a day (BID) | INTRAVENOUS | Status: DC
  Administered 2022-05-28 – 2022-06-20 (×46): 1000 mg via INTRAVENOUS
  Filled 2022-05-28 (×49): qty 100

## 2022-05-28 MED ORDER — STROKE: EARLY STAGES OF RECOVERY BOOK: Freq: Once | Status: AC

## 2022-05-28 MED ORDER — IOHEXOL 300 MG/ML  SOLN
100.0000 mL | Freq: Once | INTRAMUSCULAR | Status: AC | PRN
  Administered 2022-05-28: 100 mL via INTRAVENOUS

## 2022-05-28 MED ORDER — LORAZEPAM 2 MG/ML IJ SOLN
0.5000 mg | Freq: Three times a day (TID) | INTRAMUSCULAR | Status: DC | PRN
  Administered 2022-06-05 – 2022-06-16 (×3): 0.5 mg via INTRAVENOUS
  Filled 2022-05-28 (×4): qty 1

## 2022-05-28 MED ORDER — HYDRALAZINE HCL 20 MG/ML IJ SOLN
5.0000 mg | INTRAMUSCULAR | Status: DC | PRN
  Administered 2022-05-30 – 2022-06-01 (×3): 5 mg via INTRAVENOUS
  Filled 2022-05-28 (×3): qty 1

## 2022-05-28 MED ORDER — SODIUM CHLORIDE 0.9 % IV SOLN: INTRAVENOUS | Status: DC

## 2022-05-28 MED ORDER — LORAZEPAM 2 MG/ML IJ SOLN
1.0000 mg | Freq: Every day | INTRAMUSCULAR | Status: DC
  Administered 2022-05-28 – 2022-06-29 (×31): 1 mg via INTRAVENOUS
  Filled 2022-05-28 (×31): qty 1

## 2022-05-28 MED ORDER — SODIUM CHLORIDE 0.9 % IV BOLUS
500.0000 mL | Freq: Once | INTRAVENOUS | Status: AC
  Administered 2022-05-28: 500 mL via INTRAVENOUS

## 2022-05-28 MED ORDER — VALPROATE SODIUM 100 MG/ML IV SOLN
250.0000 mg | Freq: Four times a day (QID) | INTRAVENOUS | Status: DC
  Administered 2022-05-28 – 2022-06-21 (×92): 250 mg via INTRAVENOUS
  Filled 2022-05-28 (×103): qty 2.5

## 2022-05-28 MED ORDER — PANTOPRAZOLE SODIUM 40 MG IV SOLR
40.0000 mg | Freq: Two times a day (BID) | INTRAVENOUS | Status: DC
  Administered 2022-05-28 – 2022-06-08 (×22): 40 mg via INTRAVENOUS
  Filled 2022-05-28 (×22): qty 10

## 2022-05-28 MED ORDER — MORPHINE SULFATE (PF) 2 MG/ML IV SOLN
2.0000 mg | INTRAVENOUS | Status: DC | PRN
  Administered 2022-05-28 – 2022-05-30 (×2): 2 mg via INTRAVENOUS
  Filled 2022-05-28 (×2): qty 1

## 2022-05-28 MED ORDER — ALPRAZOLAM 0.5 MG PO TABS: 0.5000 mg | ORAL_TABLET | Freq: Every day | ORAL | Status: DC

## 2022-05-28 MED ORDER — LORAZEPAM 2 MG/ML IJ SOLN
1.0000 mg | INTRAMUSCULAR | Status: DC | PRN
  Administered 2022-05-30 – 2022-06-04 (×5): 1 mg via INTRAVENOUS
  Filled 2022-05-28 (×6): qty 1

## 2022-05-28 MED ORDER — ACETAMINOPHEN 650 MG RE SUPP: 650.0000 mg | RECTAL | Status: DC | PRN

## 2022-05-28 MED ORDER — MUSCLE RUB 10-15 % EX CREA
TOPICAL_CREAM | CUTANEOUS | Status: DC | PRN
  Administered 2022-05-28 – 2022-05-29 (×2): 1 via TOPICAL
  Filled 2022-05-28: qty 85

## 2022-05-28 MED ORDER — NICOTINE 21 MG/24HR TD PT24
21.0000 mg | MEDICATED_PATCH | Freq: Every day | TRANSDERMAL | Status: DC
  Administered 2022-06-01 – 2022-06-18 (×17): 21 mg via TRANSDERMAL
  Filled 2022-05-28 (×20): qty 1

## 2022-05-28 MED ORDER — ACETAMINOPHEN 325 MG PO TABS
650.0000 mg | ORAL_TABLET | ORAL | Status: DC | PRN
  Administered 2022-06-05: 650 mg via ORAL
  Filled 2022-05-28 (×3): qty 2

## 2022-05-28 MED ORDER — ONDANSETRON HCL 4 MG/2ML IJ SOLN
4.0000 mg | Freq: Three times a day (TID) | INTRAMUSCULAR | Status: DC | PRN
  Administered 2022-06-12 – 2022-06-28 (×7): 4 mg via INTRAVENOUS
  Filled 2022-05-28 (×9): qty 2

## 2022-05-28 NOTE — Assessment & Plan Note (Signed)
No chest pain.  Troponin negative x2 -Hold aspirin and Zocor due to NG tube placement

## 2022-05-28 NOTE — Assessment & Plan Note (Signed)
-  hold oral blood pressure medications -IV hydralazine as needed

## 2022-05-28 NOTE — ED Notes (Addendum)
Pt family called out for assistance. Pt family reports she thinks pt is having stroke. Pt states left arm cramping and pain. Pt has arm raised up and appears to be stuck in position. Pt able to lower arm back down. Pt mumbling words at this time. RN Caryl Pina at bedside and advised that this is similar to what happened earlier with the pt. Pt now speaking clearer and taken to MRI.  Pt asking for aspercream to apply all over for the pain.

## 2022-05-28 NOTE — Assessment & Plan Note (Signed)
Renal function stable.  GFR> 60. -Follow-up by BMP

## 2022-05-28 NOTE — Assessment & Plan Note (Addendum)
CT showed new small-bowel obstruction, with transition point in right lower abdomen, suspicious for adhesion. Pt has severe abdominal distention.  Consulted Dr. Dahlia Byes of surgery, advising conservative management with NG tube.  Repeat x-ray with no significant change.  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 -Continue with NG tube suctioning. -Avoid opioids if possible as there can be contributory -Supportive care

## 2022-05-28 NOTE — ED Notes (Signed)
Pt in CT at this time.

## 2022-05-28 NOTE — Consult Note (Signed)
Neurology Consultation Reason for Consult: Slurred speech and left arm weakness  Requesting Physician: Duffy Bruce  CC: Pain all over   History is obtained from: Nurse at bedside, patient and chart review   HPI: Calvin [name] is a 71 y.o. 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   He initially presented with c/f hematemesis, found to have a SBO  Code stroke was activated by nursing when the patient had called out stating his arm was in a lot of pain.  Subsequently nurse saw his speech was very slurred and his left arm was significantly weak.  By the time of my arrival within minutes, these symptoms had resolved.  Patient reported that he felt like he was having a stroke.  Subsequently at approximately 4:30 PM he had another episode per chart review, with left arm stiffening "Pt states left arm cramping and pain. Pt has arm raised up and appears to be stuck in position. Pt able to lower arm back down. Pt mumbling words at this time. RN Caryl Pina at bedside and advised that this is similar to what happened earlier with the pt"  Regarding his prior history of seizures, no semiology description is available in notes that I am able to review. Of family contacts I was unable to reach daughter or brother (brother's listed number not in service) but sister Marcie Bal picked up and confirmed no seizures since 2016 and that his seizures occurred in the setting of ethanol intoxication or withdrawal. She describes generalized seizures and does not note any lateralized weakness after the events to her knowledge. However, per note from Dr. Valora Corporal in 2016  " pt has a hx of seizures but just had another one while I was in the room that appeared to be simple partial;  these are likely provoked by UTI and subtherapeutic VPA  levels."  Marcie Bal confirms that he has tolerated Depakote well in the past without any adverse reaction to it that she is aware of (but that he would not take it when he wanted to drink due to concern for interaction with alcohol). He has also been on Vimpat per prior records. I suspect medications were tapered after he stopped drinking alcohol but cannot confirm this.   LKW:  Thrombolytic given?: No, here for GI bleed concern IA performed?: No, or if yes, groin puncture time:  Premorbid modified rankin scale:      4 - Moderately severe disability. Unable to attend to own bodily needs without assistance, and unable to walk unassisted.  ROS: Unable to obtain due to patient focused on "pain all over" during my interview  Past Medical History:  Diagnosis Date   Alcohol abuse    drinks on weekend   Anemia    Anxiety    Arthritis    Cancer ([place])    colon,throat   COPD (chronic obstructive pulmonary disease) ([place])    Coronary artery disease    Depression    Gout    Hypertension    Myocardial infarction [facility])    Neuromuscular disorder ([place])    Seizures ([place])    last 6 months ago   Stroke [facility])    multiple  left side weakness   Tremors of nervous system    Past Surgical History:  Procedure Laterality Date   CARPAL TUNNEL RELEASE Left 10/19/2015   Procedure: CARPAL  TUNNEL RELEASE;  Surgeon: Hessie Knows, MD;  Location: [facility] ORS;  Service: Orthopedics;  Laterality: Left;   COLON SURGERY     COLONOSCOPY WITH PROPOFOL N/A 10/26/2021   Procedure: COLONOSCOPY WITH PROPOFOL;  Surgeon: Lesly Rubenstein, MD;  Location: [facility] ENDOSCOPY;  Service: Endoscopy;  Laterality: N/A;   JOINT REPLACEMENT     left partial hip    THROAT SURGERY  2013   cancer   Current Outpatient Medications  Medication Instructions   acetaminophen (TYLENOL) 650 mg, Oral, Every 6 hours   ALPRAZolam (XANAX) 0.25 mg, Oral, Daily   ALPRAZolam (XANAX) 0.5 mg, Oral, 2 times daily   alum & mag hydroxide-simeth  (MAALOX/MYLANTA) 200-200-20 MG/5ML suspension 30 mLs, Oral, Every 12 hours PRN   aspirin 81 mg, Oral, Daily   B Complex Vitamins (VITAMIN B-COMPLEX) TABS 1 tablet, Oral, Daily   B Complex-C (B-COMPLEX WITH VITAMIN C) tablet 1 tablet, Oral, Daily   bisacodyl (DULCOLAX) 10 mg, Rectal, Every morning   bisacodyl (DULCOLAX) 5 mg, Oral, Daily   Calcium Carbonate-Vit D-Min (CALCIUM 600+D PLUS MINERALS) 600-400 MG-UNIT TABS 1 tablet, Oral, 2 times daily   clindamycin-benzoyl peroxide (BENZACLIN) gel 1 application , Topical, Every morning   Dextromethorphan-Benzocaine (CEPACOL SORE THROAT & COUGH) 5-7.5 MG LOZG 1 lozenge, Mouth/Throat, Every  3 hours PRN   furosemide (LASIX) 20 mg, Oral, Daily   gabapentin (NEURONTIN) 800 mg, Oral, 3 times daily   gabapentin (NEURONTIN) 100 mg, Oral, Every morning, (Take only with morning '800mg'$  dose to equal '900mg'$  total)   guaiFENesin (MUCINEX) 600 mg, Oral, 2 times daily   HYDROcodone-acetaminophen (NORCO/VICODIN) 5-325 MG tablet 0.5 tablets, Oral, Every 12 hours   lactulose (CHRONULAC) 20 g, Oral, Daily   levETIRAcetam (KEPPRA) 1,000 mg, Oral, 2 times daily   Lidocaine 4 % PTCH 1 patch, Apply externally, Daily at bedtime, Apply to left side of neck daily at bedtime for 5 days   Lidocaine HCl 4 % CREA 1 application , Apply externally, Daily, Apply from left side of face, down to left side of body to left foot   lisinopril (ZESTRIL) 5 mg, Oral, Daily   Menthol, Topical Analgesic, (BIOFREEZE) 4 % GEL 1 application , Topical, Daily, (Apply to left side of face and body)   metoprolol succinate (TOPROL-XL) 25 mg, Oral, Daily   nitroGLYCERIN (NITROSTAT) 0.4 mg, Sublingual, Every 5 min PRN   nystatin (MYCOSTATIN/NYSTOP) powder 1 application , Topical, 2 times daily PRN   Omega-3 1,000 mg, Oral, Daily   ondansetron (ZOFRAN-ODT) 4 mg, Oral, Every 8 hours PRN   pantoprazole (PROTONIX) 20 mg, Oral, Every morning   polyethylene glycol (MIRALAX / GLYCOLAX) 34 g, Oral, Daily    senna (SENOKOT) 8.6 mg, Oral, Daily at bedtime   sertraline (ZOLOFT) 50 mg, Oral, Daily   simvastatin (ZOCOR) 10 mg, Oral, Daily at bedtime   tamsulosin (FLOMAX) 0.4 mg, Oral, Every evening   tiotropium (SPIRIVA) 18 mcg, Inhalation, Daily   Wheat Dextrin (BENEFIBER DRINK MIX PO) 5 g, Oral, Daily     Family History  Problem Relation Age of Onset   Cancer Mother    Hypertension Father    Leukemia Brother     Social History:  reports that he has been smoking. He has never used smokeless tobacco. He reports current alcohol use. He reports that he does not use drugs.  Exam: Current vital signs: BP 102/69   Pulse (!) 108   Temp 98 F (36.7 C)   Resp 20   SpO2 95%  Vital signs in last 24 hours: Temp:  [98 F (36.7 C)] 98 F (36.7 C) (09/24 1230) Pulse Rate:  [108] 108 (09/24 1230) Resp:  [20] 20 (09/24 1230) BP: (102)/(69) 102/69 (09/24 1230) SpO2:  [95 %] 95 % (09/24 1230)   Physical Exam  Constitutional: Appears chronically and acutely ill  Psych: Affect perseverative on pain Eyes: No scleral injection HENT: No oropharyngeal obstruction.  MSK: no joint deformities.  Cardiovascular: Perfusing extremities well Respiratory: Effort normal, non-labored breathing GI: Distended, mildly firm, no more painful than anywhere else on his body per patient report Skin: Warm dry and intact visible skin  Neuro: Mental Status: Patient is awake, alert, oriented to person, place, month, year, and situation, history limited due to pain No signs of aphasia or neglect Cranial Nerves: II: Visual Fields are full. Pupils are equal, round, and reactive to light.   III,IV, VI: EOMI without ptosis or diploplia. Saccadic pursuits V: Facial sensation is symmetric to light touch VII: Facial movement is symmetric.  VIII: hearing is intact to voice X: Uvula elevates symmetrically XI: Shoulder shrug is symmetric. XII: tongue is midline without atrophy or fasciculations.  Motor: No pronator  drift in bilateral upper extremities. Wiggles bilateral toes.  Sensory: Diffuse pain everywhere, but reports sensation is symmetric throughout other than reduced in the left upper extremity (chronic)  Cerebellar: FNF intact bilaterally Gait:  Wheelchair at baseline with PT assist for ambulation; deferred in the acute setting  NIHSS total 8 Score breakdown: 3 for each leg (6), 1 for dysarthria, 1 for sensory loss  I have reviewed labs in epic and the results pertinent to this consultation are:  Basic Metabolic Panel: Recent Labs  Lab 05/28/22 1208 05/28/22 1212  NA 140  --   K 4.6  --   CL 107  --   CO2 22  --   GLUCOSE 122*  --   BUN 13  --   CREATININE 1.18  --   CALCIUM 9.2  --   MG  --  2.3    CBC: Recent Labs  Lab 05/28/22 1208 05/28/22 1817  WBC 9.8 9.6  NEUTROABS 8.5*  --   HGB 14.9 14.2  HCT 47.0 45.6  MCV 95.5 96.0  PLT 206 210    Coagulation Studies: Recent Labs    05/28/22 December 14, 1206  LABPROT 13.5  INR 1.0      I have reviewed the images obtained:  Head CT personally reviewed, agree with radiology 1. Degenerative cervical spondylosis with multilevel disc disease and facet disease. 2. Mild left foraminal stenosis at C2-3. 3. Mild multifactorial spinal stenosis at C3-4. There is also moderate right and mild to moderate left foraminal stenosis. 4. Mild right foraminal stenosis at C4-5. 5. Mild bilateral foraminal stenosis at C5-6. 6. Shallow left paracentral disc protrusion at C7-T1 but no significant neural compression.  CT ab/pelvis New small-bowel obstruction, with transition point in right lower abdomen suspicious for adhesion. No significant change in severe diffuse colonic dilatation, consistent with chronic colonic ileus or pseudo-obstruction. Cholelithiasis. No radiographic evidence of cholecystitis. Stable small hiatal hernia. Aortic Atherosclerosis (ICD10-I70.0).   Impression: TIA vs. Focal seizure in the setting of SBO and  inability to keep down PO meds. His chronic encephalomalacia on the right is likely to be a seizure focus. Will increase anti-seizure medications as below.  Recommendations: - MRI brain and C-spine to rule out acute process (reviewed, negative for acute process)  # Concern for breakthrough focal seizures - At home on xanax  0.75 in the morning and 0.5 in the evening  - substitute with 1 mg ativan QHS to avoid withdrawal seizures,  - may consider 1 mg BID if not too sedated - Home dose of Keppra is 500 mg BID, will increase to Keppra 1000 mg BID per max of CrCl 60 (appreciate pharmacy assistance with calculation) - Depakote 250 mg q6hr to substitute gabapentin 800 mg TID (plus additional 100 mg in the morning) as gabapentin does have antiseizure effects but is not available IV  # C-spine stenosis - Outpatient neurosurgery referral vs. Curbside inpatient to discuss if patient needs neurosurgical follow-up for C-spine stenosis   Neurology will follow to confirm spell control, please continue to track episodes of left sided spasm / weakness  Lesleigh Noe MD-PhD [facility] [phone] [facility] coverage for [facility] is from 8 AM to 4 AM in-house and 4 PM to 8 PM by telephone/video. 8 PM to 8 AM emergent questions or overnight urgent questions should be addressed to Teleneurology On-call or [facility] neurohospitalist; contact information can be found on AMION   Total critical care time: 50 minutes   Critical care time was exclusive of separately billable procedures and treating other patients.   Critical care was necessary to treat or prevent imminent or life-threatening deterioration.   Critical care was time spent personally by me on the following activities: development of treatment plan with patient and/or surrogate as well as nursing, discussions with consultants/primary team, evaluation of patient's response to treatment, examination of patient, obtaining  history from patient or surrogate, ordering and performing treatments and interventions, ordering and review of laboratory studies, ordering and review of radiographic studies, and re-evaluation of patient's condition as needed, as documented above.

## 2022-05-28 NOTE — Assessment & Plan Note (Signed)
Coffee-ground emesis was reported, but hemoglobin stable. Hemoglobin 14.9. -IV Protonix 40 mg twice daily -Follow-up CBC every 8 hours -PT/OT and type screen

## 2022-05-28 NOTE — H&P (Signed)
History and Physical    Calvin [name] UXN:[id number] DOB: 1951/01/29 DOA: 05/28/2022  Referring MD/NP/PA:   PCP: Pcp, No   Patient coming from:  The patient is coming from SNF     Chief Complaint: Nausea, vomiting, abdominal distention, abdominal pain  HPI: Calvin [name] is a 71 y.o. male with medical history significant of Ogilvie's syndrome, ileus, hypertension, hyperlipidemia, COPD, stroke with mild left-sided weakness, GERD, gout, depression with anxiety, seizure, CAD, MI,  alcohol abuse, CKD stage IIIa, dCHF, colon cancer, thyroid cancer, who presents with Nausea, vomiting, abdominal distention, abdominal pain.   Patient states that he has nausea, vomiting, abdominal distention, abdominal pain for more than 2 days, which has been progressively worsening.  The last bowel movement was yesterday.  Per report, patient had coffee-ground emesis in facility.  No fever or chills.  Currently no chest pain, cough, shortness breath.  No symptoms of UTI. Pt has hx of stroke with left-sided weakness.  Per ED physician, shortly after returning from CT, pt was noted to develop left facial numbness, L arm numbness and slurred speech acutely in ED.  Data reviewed independently and ED Course: pt was found to have WBC 9.8, lactic acid 1.2, INR 1.0, troponin level 8, 8, hemoglobin 14.9, lipase 30, liver function normal, GFR> 60, temperature normal, blood pressure 129/88, heart rate 108, RR 22, oxygen saturation 95% on room air.  CT head is negative for acute intracranial abnormalities, but showed remote stroke.  CT scan of abdomen/pelvis showed small bowel obstruction.  Patient is admitted to telemetry bed as inpatient. Dr. Dahlia Byes of surgery and Dr. Curly Shores of neuro are consulted.   EKG: I have personally reviewed.  Seem to be sinus and regular rhythm, QTc 479, poor R wave progression, low voltage, Q-wave in inferior leads.  CT-abd/pelvis: New small-bowel obstruction, with transition point in right lower  abdomen suspicious for adhesion.   No significant change in severe diffuse colonic dilatation, consistent with chronic colonic ileus or pseudo-obstruction.   Cholelithiasis. No radiographic evidence of cholecystitis.   Stable small hiatal hernia.   Aortic Atherosclerosis (ICD10-I70.0).  Review of Systems:   General: no fevers, chills, no body weight gain, has poor appetite, has fatigue HEENT: no blurry vision, hearing changes or sore throat Respiratory: no dyspnea, coughing, wheezing CV: no chest pain, no palpitations GI: has nausea, vomiting, abdominal pain and abdominal distention, no diarrhea, constipation GU: no dysuria, burning on urination, increased urinary frequency, hematuria  Ext: no leg edema Neuro: Has left facial droop, left arm numbness and slurred speech, has chronic left-sided weakness Skin: no rash, no skin tear. MSK: No muscle spasm, no deformity, no limitation of range of movement in spin Heme: No easy bruising.  Travel history: No recent long distant travel.   Allergy: No Known Allergies  Past Medical History:  Diagnosis Date   Alcohol abuse    drinks on weekend   Anemia    Anxiety    Arthritis    Cancer (HCC)    colon,throat   COPD (chronic obstructive pulmonary disease) ([place])    Coronary artery disease    Depression    Gout    Hypertension    Myocardial infarction ([place])    Neuromuscular disorder ([place])    Seizures ([place])    last 6 months ago   Stroke [facility])    multiple  left side weakness   Tremors of nervous system     Past Surgical History:  Procedure Laterality Date   CARPAL TUNNEL RELEASE  Left 10/19/2015   Procedure: CARPAL TUNNEL RELEASE;  Surgeon: Hessie Knows, MD;  Location: [facility] ORS;  Service: Orthopedics;  Laterality: Left;   COLON SURGERY     COLONOSCOPY WITH PROPOFOL N/A 10/26/2021   Procedure: COLONOSCOPY WITH PROPOFOL;  Surgeon: Lesly Rubenstein, MD;  Location: [facility] ENDOSCOPY;  Service: Endoscopy;  Laterality: N/A;   JOINT  REPLACEMENT     left partial hip    THROAT SURGERY  2013   cancer    Social History:  reports that he has been smoking. He has never used smokeless tobacco. He reports current alcohol use. He reports that he does not use drugs.  Family History:  Family History  Problem Relation Age of Onset   Cancer Mother    Hypertension Father    Leukemia Brother      Prior to Admission medications   Medication Sig Start Date End Date Taking? Authorizing Provider  acetaminophen (TYLENOL) 325 MG tablet Take 650 mg by mouth every 6 (six) hours.    [provider]  ALPRAZolam Duanne Moron) 0.25 MG tablet Take 0.25 mg by mouth daily.    [provider]  ALPRAZolam Duanne Moron) 0.5 MG tablet Take 0.5 mg by mouth 2 (two) times daily.    [provider]  alum & mag hydroxide-simeth (MAALOX/MYLANTA) 200-200-20 MG/5ML suspension Take 30 mLs by mouth every 12 (twelve) hours as needed for indigestion or heartburn.    [provider]  aspirin 81 MG chewable tablet Take 81 mg by mouth daily.    [provider]  B Complex Vitamins (VITAMIN B-COMPLEX) TABS Take 1 tablet by mouth daily. 06/09/21   [provider]  B Complex-C (B-COMPLEX WITH VITAMIN C) tablet Take 1 tablet by mouth daily.    [provider]  bisacodyl (DULCOLAX) 10 MG suppository Place 10 mg rectally in the morning.    [provider]  bisacodyl (DULCOLAX) 5 MG EC tablet Take 5 mg by mouth daily.    [provider]  Calcium Carbonate-Vit D-Min (CALCIUM 600+D PLUS MINERALS) 600-400 MG-UNIT TABS Take 1 tablet by mouth 2 (two) times daily.    [provider]  clindamycin-benzoyl peroxide (BENZACLIN) gel Apply 1 application topically every morning. 10/03/21   [provider]  Dextromethorphan-Benzocaine (CEPACOL SORE THROAT & COUGH) 5-7.5 MG LOZG Use as directed 1 lozenge in the mouth or throat every 3 (three) hours as needed (sore throat).    [provider]   furosemide (LASIX) 20 MG tablet Take 1 tablet (20 mg total) by mouth daily. 11/02/21   Wouk, Ailene Rud, MD  gabapentin (NEURONTIN) 100 MG capsule Take 100 mg by mouth in the morning. (Take only with morning '800mg'$  dose to equal '900mg'$  total)    [provider]  gabapentin (NEURONTIN) 400 MG capsule Take 2 capsules (800 mg total) by mouth 3 (three) times daily. 10/02/17   Max Sane, MD  guaiFENesin (MUCINEX) 600 MG 12 hr tablet Take 600 mg by mouth 2 (two) times daily.    [provider]  HYDROcodone-acetaminophen (NORCO/VICODIN) 5-325 MG tablet Take 0.5 tablets by mouth every 12 (twelve) hours. 11/02/21   Wouk, Ailene Rud, MD  lactulose (CHRONULAC) 10 GM/15ML solution Take 20 g by mouth daily.    [provider]  levETIRAcetam (KEPPRA) 500 MG tablet Take 1,000 mg by mouth 2 (two) times daily.    [provider]  Lidocaine 4 % PTCH Apply 1 patch topically at bedtime. Apply to left side of neck daily at bedtime  for 5 days    [provider]  Lidocaine HCl 4 % CREA Apply 1 application topically daily. Apply from left side of face, down to left side of body to left foot    [provider]  lisinopril (ZESTRIL) 5 MG tablet Take 1 tablet (5 mg total) by mouth daily. 08/14/19   Dhungel, Flonnie Overman, MD  Menthol, Topical Analgesic, (BIOFREEZE) 4 % GEL Apply 1 application topically daily. (Apply to left side of face and body)    [provider]  metoprolol succinate (TOPROL-XL) 25 MG 24 hr tablet Take 25 mg by mouth daily.    [provider]  nitroGLYCERIN (NITROSTAT) 0.4 MG SL tablet Place 0.4 mg under the tongue every 5 (five) minutes as needed for chest pain.    [provider]  nystatin (MYCOSTATIN/NYSTOP) powder Apply 1 application topically 2 (two) times daily as needed (groin rash).    [provider]  Omega-3 1000 MG CAPS Take 1,000 mg by mouth daily.    [provider]  ondansetron (ZOFRAN-ODT) 4 MG  disintegrating tablet Take 4 mg by mouth every 8 (eight) hours as needed for nausea or vomiting.    [provider]  pantoprazole (PROTONIX) 20 MG tablet Take 20 mg by mouth in the morning.    [provider]  polyethylene glycol (MIRALAX / GLYCOLAX) 17 g packet Take 34 g by mouth daily. 11/02/21   Wouk, Ailene Rud, MD  senna (SENOKOT) 8.6 MG TABS tablet Take 1 tablet (8.6 mg total) by mouth at bedtime. Patient taking differently: Take 1 tablet by mouth every evening. 09/20/19   Rudene Re, MD  sertraline (ZOLOFT) 50 MG tablet Take 50 mg by mouth daily.    [provider]  simvastatin (ZOCOR) 10 MG tablet Take 10 mg by mouth at bedtime.     [provider]  tamsulosin (FLOMAX) 0.4 MG CAPS capsule Take 0.4 mg by mouth every evening.    [provider]  tiotropium (SPIRIVA) 18 MCG inhalation capsule Place 18 mcg into inhaler and inhale daily.    [provider]  Wheat Dextrin (BENEFIBER DRINK MIX PO) Take 5 g by mouth daily.    [provider]    Physical Exam: Vitals:   05/28/22 1230 05/28/22 1500 05/28/22 1530  BP: 102/69 115/86 129/88  Pulse: (!) 108 (!) 105 99  Resp: '20 17 18  '$ Temp: 98 F (36.7 C)    SpO2: 95% 94% 96%   General: Not in acute distress HEENT:       Eyes: PERRL, EOMI, no scleral icterus.       ENT: No discharge from the ears and nose, no pharynx injection, no tonsillar enlargement.        Neck: No JVD, no bruit, no mass felt. Heme: No neck lymph node enlargement. Cardiac: S1/S2, RRR, No murmurs, No gallops or rubs. Respiratory: No rales, wheezing, rhonchi or rubs. GI: severely distended, diffused tenderness, no rebound pain, no organomegaly, BS present. GU: No hematuria Ext: No pitting leg edema bilaterally. 1+DP/PT pulse bilaterally. Musculoskeletal: No joint deformities, No joint redness or warmth, no limitation of ROM in spin. Skin: No rashes.  Neuro: Alert, oriented X3, cranial nerves II-XII  grossly intact. Muscle strength 2/5  in left arm and 3/5 in both legs and 5/5 in right arm  Psych: Patient is not psychotic, no suicidal or hemocidal ideation.  Labs on Admission: I have personally reviewed following labs and imaging studies  CBC: Recent Labs  Lab  05/28/22 1208  WBC 9.8  NEUTROABS 8.5*  HGB 14.9  HCT 47.0  MCV 95.5  PLT 768   Basic Metabolic Panel: Recent Labs  Lab 05/28/22 1208  NA 140  K 4.6  CL 107  CO2 22  GLUCOSE 122*  BUN 13  CREATININE 1.18  CALCIUM 9.2   GFR: CrCl cannot be calculated (Unknown ideal weight.). Liver Function Tests: Recent Labs  Lab 05/28/22 1208  AST 28  ALT 17  ALKPHOS 64  BILITOT 1.0  PROT 7.8  ALBUMIN 4.2   Recent Labs  Lab 05/28/22 1208  LIPASE 30   No results for input(s): "AMMONIA" in the last 168 hours. Coagulation Profile: Recent Labs  Lab 05/28/22 1208  INR 1.0   Cardiac Enzymes: No results for input(s): "CKTOTAL", "CKMB", "CKMBINDEX", "TROPONINI" in the last 168 hours. BNP (last 3 results) No results for input(s): "PROBNP" in the last 8760 hours. HbA1C: No results for input(s): "HGBA1C" in the last 72 hours. CBG: Recent Labs  Lab 05/28/22 1503  GLUCAP 99   Lipid Profile: No results for input(s): "CHOL", "HDL", "LDLCALC", "TRIG", "CHOLHDL", "LDLDIRECT" in the last 72 hours. Thyroid Function Tests: No results for input(s): "TSH", "T4TOTAL", "FREET4", "T3FREE", "THYROIDAB" in the last 72 hours. Anemia Panel: No results for input(s): "VITAMINB12", "FOLATE", "FERRITIN", "TIBC", "IRON", "RETICCTPCT" in the last 72 hours. Urine analysis:    Component Value Date/Time   COLORURINE YELLOW (A) 01/10/2022 1059   APPEARANCEUR CLEAR (A) 01/10/2022 1059   APPEARANCEUR Clear 08/14/2014 0933   LABSPEC 1.013 01/10/2022 1059   LABSPEC 1.015 08/14/2014 0933   PHURINE 5.0 01/10/2022 1059   GLUCOSEU NEGATIVE 01/10/2022 1059   GLUCOSEU Negative 08/14/2014 0933   HGBUR NEGATIVE 01/10/2022 1059   BILIRUBINUR  NEGATIVE 01/10/2022 1059   BILIRUBINUR Negative 08/14/2014 0933   KETONESUR NEGATIVE 01/10/2022 1059   PROTEINUR NEGATIVE 01/10/2022 1059   UROBILINOGEN 0.2 06/12/2010 0103   NITRITE NEGATIVE 01/10/2022 1059   LEUKOCYTESUR TRACE (A) 01/10/2022 1059   LEUKOCYTESUR Negative 08/14/2014 0933   Sepsis Labs: '@LABRCNTIP'$ (procalcitonin:4,lacticidven:4) ) Recent Results (from the past 240 hour(s))  Resp Panel by RT-PCR (Flu A&B, Covid) Anterior Nasal Swab     Status: None   Collection Time: 05/28/22  3:04 PM   Specimen: Anterior Nasal Swab  Result Value Ref Range Status   SARS Coronavirus 2 by RT PCR NEGATIVE NEGATIVE Final    Comment: (NOTE) SARS-CoV-2 target nucleic acids are NOT DETECTED.  The SARS-CoV-2 RNA is generally detectable in upper respiratory specimens during the acute phase of infection. The lowest concentration of SARS-CoV-2 viral copies this assay can detect is 138 copies/mL. A negative result does not preclude SARS-Cov-2 infection and should not be used as the sole basis for treatment or other patient management decisions. A negative result may occur with  improper specimen collection/handling, submission of specimen other than nasopharyngeal swab, presence of viral mutation(s) within the areas targeted by this assay, and inadequate number of viral copies(<138 copies/mL). A negative result must be combined with clinical observations, patient history, and epidemiological information. The expected result is Negative.  Fact Sheet for Patients:  EntrepreneurPulse.com.au  Fact Sheet for Healthcare Providers:  IncredibleEmployment.be  This test is no t yet approved or cleared by the [place] FDA and  has been authorized for detection and/or diagnosis of SARS-CoV-2 by FDA under an Emergency Use Authorization (EUA). This EUA will remain  in effect (meaning this test can be used) for the duration of the COVID-19 declaration under  Section 564(b)(1)  of the Act, 21 U.S.C.section 360bbb-3(b)(1), unless the authorization is terminated  or revoked sooner.       Influenza A by PCR NEGATIVE NEGATIVE Final   Influenza B by PCR NEGATIVE NEGATIVE Final    Comment: (NOTE) The Xpert Xpress SARS-CoV-2/FLU/RSV plus assay is intended as an aid in the diagnosis of influenza from Nasopharyngeal swab specimens and should not be used as a sole basis for treatment. Nasal washings and aspirates are unacceptable for Xpert Xpress SARS-CoV-2/FLU/RSV testing.  Fact Sheet for Patients: EntrepreneurPulse.com.au  Fact Sheet for Healthcare Providers: IncredibleEmployment.be  This test is not yet approved or cleared by the [place] FDA and has been authorized for detection and/or diagnosis of SARS-CoV-2 by FDA under an Emergency Use Authorization (EUA). This EUA will remain in effect (meaning this test can be used) for the duration of the COVID-19 declaration under Section 564(b)(1) of the Act, 21 U.S.C. section 360bbb-3(b)(1), unless the authorization is terminated or revoked.  Performed at [facility], [address], [place], [place] [address]      Radiological Exams on Admission: CT HEAD CODE STROKE WO CONTRAST  Result Date: 05/28/2022 CLINICAL DATA:  Code stroke. EXAM: CT HEAD WITHOUT CONTRAST TECHNIQUE: Contiguous axial images were obtained from the base of the skull through the vertex without intravenous contrast. RADIATION DOSE REDUCTION: This exam was performed according to the departmental dose-optimization program which includes automated exposure control, adjustment of the mA and/or kV according to patient size and/or use of iterative reconstruction technique. COMPARISON:  CT head without contrast 10/02/2021. MRI head without contrast 10/02/2021. FINDINGS: Brain: Remote right occipital and anterior right frontal lobe infarcts are stable. Chronic encephalomalacia of the  anterior inferior frontal lobes is again seen. Remote lacunar infarcts are present in the posterior left cerebellum. No acute infarct, hemorrhage, or mass lesion is present. Moderate generalized atrophy and white matter disease is stable. Basal ganglia are intact. Insular ribbon is normal. Brainstem and cerebellum are stable. Vascular: Vascular enhancement is secondary to contrast from an earlier study. Skull: Focal soft tissue density is present in the anterior left frontotemporal scalp. Extracranial soft tissues are otherwise within normal limits. Calvarium is intact. No focal lytic or blastic lesions are present. Sinuses/Orbits: Chronic wall thickening is present in the right maxillary sinus. No active disease is present. A polyp or mucous retention cyst is again noted within the right sphenoid sinus. The paranasal sinuses and mastoid air cells are otherwise clear. The globes and orbits are within normal limits. ASPECTS [facility] Stroke Program Early CT Score) - Ganglionic level infarction (caudate, lentiform nuclei, internal capsule, insula, M1-M3 cortex): 7/7 - Supraganglionic infarction (M4-M6 cortex): 3/3 Total score (0-10 with 10 being normal): 10/10 . IMPRESSION: 1. No acute intracranial abnormality or significant interval change. 2. Stable remote infarcts involving the right occipital and anterior right frontal lobe. 3. Chronic encephalomalacia of the anterior inferior frontal lobes. 4. Remote lacunar infarcts of the left cerebellum. 5. Stable atrophy and white matter disease. 6. ASPECTS is 10/10. *The above was relayed via text pager to Dr. Curly Shores on 05/28/2022 at 15:34 Electronically Signed   By: San Morelle M.D.   On: 05/28/2022 15:37   CT ABDOMEN PELVIS W CONTRAST  Result Date: 05/28/2022 CLINICAL DATA:  Acute abdominal pain and vomiting, with coffee-ground emesis. EXAM: CT ABDOMEN AND PELVIS WITH CONTRAST TECHNIQUE: Multidetector CT imaging of the abdomen and pelvis was performed using the  standard protocol following bolus administration of intravenous contrast. RADIATION DOSE REDUCTION: This exam was performed  according to the departmental dose-optimization program which includes automated exposure control, adjustment of the mA and/or kV according to patient size and/or use of iterative reconstruction technique. CONTRAST:  142m OMNIPAQUE IOHEXOL 300 MG/ML  SOLN COMPARISON:  01/10/2022 and 09/20/2019 FINDINGS: Lower Chest: No acute findings. Hepatobiliary: No hepatic masses identified. Multiple tiny gallstones are again seen, however there is no evidence of cholecystitis or biliary ductal dilatation. Pancreas:  No mass or inflammatory changes. Spleen: Within normal limits in size and appearance. Adrenals/Urinary Tract: Stable small bilateral adrenal masses, consistent with benign adenomas (no followup imaging is recommended). Moderate bilateral renal parenchymal scarring is again noted. No suspicious renal masses identified. No evidence of ureteral calculi or hydronephrosis. Stomach/Bowel: Severe diffuse colonic dilatation with air-fluid levels is again seen to the level of the rectum, without significant change compared to both prior studies dating back to 2021. New moderate dilatation of small bowel is seen, with transition point to nondilated distal small bowel loops in the right lower abdomen, suspicious for adhesion. No evidence of inflammatory process or abnormal fluid collections. Small hiatal hernia again noted. Vascular/Lymphatic: No pathologically enlarged lymph nodes. No acute vascular findings. Aortic atherosclerotic calcification incidentally noted. Reproductive:  No mass or other significant abnormality. Other:  None. Musculoskeletal: No suspicious bone lesions identified. Left hip prosthesis again noted. IMPRESSION: New small-bowel obstruction, with transition point in right lower abdomen suspicious for adhesion. No significant change in severe diffuse colonic dilatation, consistent  with chronic colonic ileus or pseudo-obstruction. Cholelithiasis. No radiographic evidence of cholecystitis. Stable small hiatal hernia. Aortic Atherosclerosis (ICD10-I70.0). Electronically Signed   By: JMarlaine HindM.D.   On: 05/28/2022 14:56      Assessment/Plan Principal Problem:   SBO (small bowel obstruction) (HCC) Active Problems:   Stroke (HCC)   Chronic diastolic CHF (congestive heart failure) (HCC)   Seizures (HCC)   HTN (hypertension)   CAD (coronary artery disease)   HLD (hyperlipidemia)   Chronic kidney disease, stage 3a (HCC)   COPD (chronic obstructive pulmonary disease) (HCC)   Coffee ground emesis   Tobacco abuse   Depression with anxiety   Assessment and Plan: * SBO (small bowel obstruction) (HCC) CT showed new small-bowel obstruction, with transition point in right lower abdomen, suspicious for adhesion. Pt has severe abdominal distention.  Consulted Dr. PDahlia Byesof surgery.   -Admitted to telemetry bed as inpatient -N.p.o. -IV fluid: 1.5 L normal saline, followed by 75 cc/h -NG tube -hold all oral medications due to NG tube placement  [place] hx of stroke with left sided weakness. Today, pt developed new strokelike symptoms, including left arm numbness, left facial droop, slurred speech.  CT head is negative for acute intra cranial issues other showed old stroke.  Consulted Dr. BCurly Shoresof neurology.  - Obtain MRI of brain and MRI of neck per Dr. BCurly Shores- Hold ASA due to coffee-ground emesis - Continue Zocor when NG tube is removed (hold it now) - fasting lipid panel and HbA1c  - 2D transthoracic echocardiography  - swallowing screen. If fails, will get SLP - PT/OT consult  Chronic diastolic CHF (congestive heart failure) ([facility] 2D echo on 08/09/2019 showed EF of 55 to 60% with grade 1 diastolic dysfunction.  Patient does not have leg edema or JVD.  CHF seem to be compensated. -Hold Lasix -Check BNP  Seizures (HCC) Seizure -Seizure  precaution -When necessary Ativan for seizure -Continue Home medications: Keppra 5 mg twice daily by IV -check Keppra level    HTN (hypertension) -  hold oral blood pressure medications -IV hydralazine as needed  CAD (coronary artery disease) No chest pain.  Troponin negative x2 -Hold aspirin and Zocor due to NG tube placement  HLD (hyperlipidemia) - Hold Zocor  Chronic kidney disease, stage 3a ([place]) Renal function stable.  GFR> 60. -Follow-up by BMP  COPD (chronic obstructive pulmonary disease) (HCC) Stable -As needed albuterol and bronchodilators  Coffee ground emesis Coffee-ground emesis was reported, but hemoglobin stable. Hemoglobin 14.9. -IV Protonix 40 mg twice daily -Follow-up CBC every 8 hours -PT/OT and type screen  Tobacco abuse -nicotine patch  Depression with anxiety Hold home oral Xanax and Zoloft -As needed IV Ativan for anxiety          DVT ppx: SCD  Code Status: Full code per pt and his daughter  Family Communication:   Yes, patient's daughter    at bed side.         Disposition Plan:  Anticipate discharge back to previous environment  Consults called:  Dr. Dahlia Byes of surgery and Dr. Curly Shores of neuro are consulted.  Admission status and Level of care: Telemetry Medical:   as inpt       Dispo: The patient is from: SNF              Anticipated d/c is to: SNF              Anticipated d/c date is: 2 days              Patient currently is not medically stable to d/c.    Severity of Illness:  The appropriate patient status for this patient is INPATIENT. Inpatient status is judged to be reasonable and necessary in order to provide the required intensity of service to ensure the patient's safety. The patient's presenting symptoms, physical exam findings, and initial radiographic and laboratory data in the context of their chronic comorbidities is felt to place them at high risk for further clinical deterioration. Furthermore, it is not  anticipated that the patient will be medically stable for discharge from the hospital within 2 midnights of admission.   * I certify that at the point of admission it is my clinical judgment that the patient will require inpatient hospital care spanning beyond 2 midnights from the point of admission due to high intensity of service, high risk for further deterioration and high frequency of surveillance required.*       Date of Service 05/28/2022    Ivor Costa [facility]   If 7PM-7AM, please contact night-coverage [email or web address] 05/28/2022, 4:49 PM

## 2022-05-28 NOTE — ED Notes (Signed)
Pt taken to MRI at this time. RN Caryl Pina given report on pt.

## 2022-05-28 NOTE — ED Notes (Signed)
Attempted to call pt's brother for update. No answer at this time. Will try again later.

## 2022-05-28 NOTE — Assessment & Plan Note (Signed)
-   Hold Zocor

## 2022-05-28 NOTE — ED Notes (Signed)
Pt had small BM and pt was cleaned up and clean brief placed. External cath placed and pt repositioned in bed. Pt given warm blankets and gown.

## 2022-05-28 NOTE — ED Notes (Addendum)
Neurologist at bedside assessing pt, pt taken to CT

## 2022-05-28 NOTE — ED Notes (Signed)
Pt calling out for RN. This RN entered room and pt stating his left arm is hurting. This RN checked arm  to make sure it was not infiltrated. No arm swelling pt then started slurring with words and states his left face and arm is numb. Pt states I think IM having a stroke again.  This RN got MD Ellender Hose and brought to bedside to assess pt.

## 2022-05-28 NOTE — ED Notes (Signed)
Activated code stroke to [place] at [facility]

## 2022-05-28 NOTE — Assessment & Plan Note (Signed)
Stable -As needed albuterol and bronchodilators

## 2022-05-28 NOTE — Assessment & Plan Note (Addendum)
2D echo on 08/09/2019 showed EF of 55 to 60% with grade 1 diastolic dysfunction.  Patient does not have leg edema or JVD.  CHF seem to be compensated.  BNP within normal limit at 74 -Hold Lasix -Continue to monitor

## 2022-05-28 NOTE — Assessment & Plan Note (Addendum)
Pt has hx of stroke with left sided weakness. Today, pt developed new strokelike symptoms, including left arm numbness, left facial droop, slurred speech.  CT head and MRI negative for any acute abnormality. CTA with chronic vertebral occlusion. Speech and swallow evaluation with no specific recommendations as patient is at baseline. Neurology was consulted. -Continue to monitor

## 2022-05-28 NOTE — Assessment & Plan Note (Signed)
-   nicotine patch ?

## 2022-05-28 NOTE — ED Notes (Signed)
Code stroke called at this time. MD Isaacs aware. Pt is having left face numbness and arm numbness.  Neuro cart in room and [facility] on screen. This RN providing all required info.

## 2022-05-28 NOTE — ED Notes (Signed)
Activated code stroke to [place] at [facility]

## 2022-05-28 NOTE — Progress Notes (Signed)
Chaplain responded to Code Stroke; interacted briefly with pt.  Pt was on his back appearing distressed and stating that he doesn't feel good. No family is present, but pt says that brother called in to inquire about pt.  Chaplain provided brief emotional support.  Please contact as needed for further support.  Minus [place], [place]  Pager:  [phone]    05/28/22 1600  Clinical Encounter Type  Visited With Patient  Visit Type Initial;Code  Referral From Nurse  Consult/Referral To Chaplain  Stress Factors  Patient Stress Factors Health changes

## 2022-05-28 NOTE — Assessment & Plan Note (Signed)
Hold home oral Xanax and Zoloft -As needed IV Ativan for anxiety

## 2022-05-28 NOTE — ED Notes (Signed)
Pt back from CT at this time. Pt symptoms have resolved and no slurring or arm numbness or drift at this time. Pt speaking in clear sentences and just states pain all over.

## 2022-05-28 NOTE — Consult Note (Addendum)
Code stroke activated at 1501.   Rec of neuro symptoms at 1501 per RN. MD at bedside at 1501. LKW 1500.  Neuro internal page at 1502 per RN note.  TSRN paged neuro with report at 69.  Dr Curly Shores at bedside at 1515.  Left for NCCT at 1518 and returned at 1525.  Not TNK/thrombectomy candidate per neuro.  Dismissed by Dr Curly Shores and off camera at 1525.  mRS 4 as pt is mostly w/c bound but walks some using walker with PT.

## 2022-05-28 NOTE — ED Triage Notes (Signed)
Pt comes with c/o abdominal pain, nausea and coffee ground emesis. Pt had 1 episode of emesis per facility. Pt was given zofran and phergan.  Pt abdomen is distended.

## 2022-05-28 NOTE — ED Provider Notes (Signed)
[facility] Provider Note    Event Date/Time   First MD Initiated Contact with Patient 05/28/22 1210     (approximate)   History   Abdominal Pain   HPI  Calvin [name] is a 71 y.o. male with PMHx Ogilvie's syndrome, HTN, HLD, COPD, stroke, GERD, CAD, MI, CKD, CHF, here with nausea, vomiting, abdominal distention.  The patient states that for the last 2 to 3 days or so, he has had progressive worsening abdominal pain and distention.  He feels swollen.  He has had nausea and difficulty keeping food down.  He has a history of similar symptoms with bowel obstruction in the past.  States he has also been vomiting darker, black to brown emesis today.  No fevers.  No urinary symptoms.  No known sick contacts.  Nurse medication changes.     Physical Exam   Triage Vital Signs: ED Triage Vitals [05/28/22 1210]  Enc Vitals Group     BP      Pulse      Resp      Temp      Temp src      SpO2      Weight      Height      Head Circumference      Peak Flow      Pain Score 9     Pain Loc      Pain Edu?      Excl. in [place]?     Most recent vital signs: Vitals:   05/28/22 1230  BP: 102/69  Pulse: (!) 108  Resp: 20  Temp: 98 F (36.7 C)  SpO2: 95%     General: Awake, no distress.  CV:  Good peripheral perfusion.  Tachycardic. Resp:  Normal effort.  Abd:  Moderate diffuse distention.  Twinkling bowel sounds.  No peritonitis. Other:  No lower extremity edema.   ED Results / Procedures / Treatments   Labs (all labs ordered are listed, but only abnormal results are displayed) Labs Reviewed  COMPREHENSIVE METABOLIC PANEL - Abnormal; Notable for the following components:      Result Value   Glucose, Bld 122 (*)    All other components within normal limits  CBC WITH DIFFERENTIAL/PLATELET - Abnormal; Notable for the following components:   Neutro Abs 8.5 (*)    All other components within normal limits  RESP PANEL BY RT-PCR (FLU A&B, COVID) ARPGX2   LIPASE, BLOOD  LACTIC ACID, PLASMA  PROTIME-INR  URINALYSIS, ROUTINE W REFLEX MICROSCOPIC  APTT  CBG MONITORING, ED  TYPE AND SCREEN  TYPE AND SCREEN  TROPONIN I (HIGH SENSITIVITY)  TROPONIN I (HIGH SENSITIVITY)     EKG Accelerated junctional rhythm, VR 108.  QRS 82, QTc 479.  No acute ST elevations or depressions.  EKG evidence of acute ischemia or infarct.   RADIOLOGY CT abdomen/pelvis:New SBO, unchanged colonic dilatation   I also independently reviewed and agree with radiologist interpretations.   PROCEDURES:  Critical Care performed: Yes, see critical care procedure note(s)  CRITICAL CARE Performed by: Evonnie Pat   Total critical care time: 35 minutes  Critical care time was exclusive of separately billable procedures and treating other patients.  Critical care was necessary to treat or prevent imminent or life-threatening deterioration.  Critical care was time spent personally by me on the following activities: development of treatment plan with patient and/or surrogate as well as nursing, discussions with consultants, evaluation of patient's response to treatment, examination of  patient, obtaining history from patient or surrogate, ordering and performing treatments and interventions, ordering and review of laboratory studies, ordering and review of radiographic studies, pulse oximetry and re-evaluation of patient's condition.    MEDICATIONS ORDERED IN ED: Medications  sodium chloride 0.9 % bolus 1,000 mL (has no administration in time range)  sodium chloride 0.9 % bolus 500 mL (500 mLs Intravenous New Bag/Given 05/28/22 1301)  iohexol (OMNIPAQUE) 300 MG/ML solution 100 mL (100 mLs Intravenous Contrast Given 05/28/22 1419)     IMPRESSION / MDM / ASSESSMENT AND PLAN / ED COURSE  I reviewed the triage vital signs and the nursing notes.                               The patient is on the cardiac monitor to evaluate for evidence of arrhythmia and/or  significant heart rate changes.   Ddx:  Differential includes the following, with pertinent life- or limb-threatening emergencies considered:  Recurrent obstruction, ileus, perforation, gastritis, PUD, pancreatitis  Patient's presentation is most consistent with acute presentation with potential threat to life or bodily function.  MDM:   PMHx Ogilvie's syndrome, HTN, HLD, COPD, stroke, GERD, CAD, MI, CKD, CHF, here with nausea, vomiting, abdominal distention. Suspect acute small bowel obstruction clinically, though ? Of coffee-ground emesis per report though I suspect this was more so feculent. H/o Ogilvie's. CT scan obtained and reviewed, and is consistent with new SBO with transition point in RL abdomen. No significant change in colonic dilatation. Labs overall initially reassuring - no significant leukocytosis. Lytes wnl/at baseline. Lipase normal. LA is normal. Pt will be started on fluids, admit to medicine.  Shortly after returning from CT, pt noted to develop left facial numbness, L arm numbness and slurred speech acutely. H/o CVA per report. Pt had vomited prior to this. Will activate as CODE STROKE - unclear if this is TIA in setting of his acute illness, CVA. Less likely dissection as I suspect his pain is 2/2 his obstruction. Neuro consulted.  Plan to f/u CT code stroke, neuro recommendations and admit to medicine. NG ordered for his SBO.   MEDICATIONS GIVEN IN ED: Medications  sodium chloride 0.9 % bolus 1,000 mL (has no administration in time range)  sodium chloride 0.9 % bolus 500 mL (500 mLs Intravenous New Bag/Given 05/28/22 1301)  iohexol (OMNIPAQUE) 300 MG/ML solution 100 mL (100 mLs Intravenous Contrast Given 05/28/22 1419)     Consults:  Neurology   EMR reviewed  Prior ED visits, prior hospitalization for SBO     FINAL CLINICAL IMPRESSION(S) / ED DIAGNOSES   Final diagnoses:  Small bowel obstruction (HCC)  Stroke-like symptoms     Rx / DC Orders   ED  Discharge Orders     None        Note:  This document was prepared using Dragon voice recognition software and may include unintentional dictation errors.   Duffy Bruce, MD 05/28/22 [phone]

## 2022-05-28 NOTE — Assessment & Plan Note (Addendum)
Seizure -Seizure precaution -When necessary Ativan for seizure -Home p.o. Keppra is being switched with IV 1000 mg twice daily. -Continue to monitor

## 2022-05-29 ENCOUNTER — Encounter: Payer: Self-pay | Admitting: Internal Medicine

## 2022-05-29 ENCOUNTER — Inpatient Hospital Stay: Payer: Medicare Other

## 2022-05-29 DIAGNOSIS — K56609 Unspecified intestinal obstruction, unspecified as to partial versus complete obstruction: Secondary | ICD-10-CM | POA: Diagnosis not present

## 2022-05-29 DIAGNOSIS — R2990 Unspecified symptoms and signs involving the nervous system: Secondary | ICD-10-CM | POA: Diagnosis not present

## 2022-05-29 DIAGNOSIS — R569 Unspecified convulsions: Secondary | ICD-10-CM | POA: Diagnosis not present

## 2022-05-29 LAB — CBC
HCT: 43.8 % (ref 39.0–52.0)
HCT: 44 % (ref 39.0–52.0)
Hemoglobin: 14 g/dL (ref 13.0–17.0)
Hemoglobin: 13.9 g/dL (ref 13.0–17.0)
MCH: 30.4 pg (ref 26.0–34.0)
MCH: 29.9 pg (ref 26.0–34.0)
MCHC: 32 g/dL (ref 30.0–36.0)
MCHC: 31.6 g/dL (ref 30.0–36.0)
MCV: 95 fL (ref 80.0–100.0)
MCV: 94.6 fL (ref 80.0–100.0)
Platelets: 192 10*3/uL (ref 150–400)
Platelets: 195 10*3/uL (ref 150–400)
RBC: 4.61 MIL/uL (ref 4.22–5.81)
RBC: 4.65 MIL/uL (ref 4.22–5.81)
RDW: 12.3 % (ref 11.5–15.5)
RDW: 12.4 % (ref 11.5–15.5)
WBC: 11.1 10*3/uL — ABNORMAL HIGH (ref 4.0–10.5)
WBC: 10.4 10*3/uL (ref 4.0–10.5)
nRBC: 0 % (ref 0.0–0.2)
nRBC: 0 % (ref 0.0–0.2)

## 2022-05-29 LAB — LIPID PANEL
Cholesterol: 117 mg/dL (ref 0–200)
HDL: 48 mg/dL (ref >40)
LDL Cholesterol: 57 mg/dL (ref 0–99)
Total CHOL/HDL Ratio: 2.4 RATIO
Triglycerides: 59 mg/dL (ref <150)
VLDL: 12 mg/dL (ref 0–40)

## 2022-05-29 LAB — COMPREHENSIVE METABOLIC PANEL
ALT: 13 U/L (ref 0–44)
AST: 18 U/L (ref 15–41)
Albumin: 4 g/dL (ref 3.5–5.0)
Alkaline Phosphatase: 61 U/L (ref 38–126)
Anion gap: 7 (ref 5–15)
BUN: 13 mg/dL (ref 8–23)
CO2: 26 mmol/L (ref 22–32)
Calcium: 9.1 mg/dL (ref 8.9–10.3)
Chloride: 108 mmol/L (ref 98–111)
Creatinine, Ser: 1.26 mg/dL — ABNORMAL HIGH (ref 0.61–1.24)
GFR, Estimated: >60 mL/min (ref >60)
Glucose, Bld: 112 mg/dL — ABNORMAL HIGH (ref 70–99)
Potassium: 4.3 mmol/L (ref 3.5–5.1)
Sodium: 141 mmol/L (ref 135–145)
Total Bilirubin: 0.8 mg/dL (ref 0.3–1.2)
Total Protein: 7.3 g/dL (ref 6.5–8.1)

## 2022-05-29 LAB — PROTIME-INR
INR: 1 (ref 0.8–1.2)
Prothrombin Time: 13.5 seconds (ref 11.4–15.2)

## 2022-05-29 MED ORDER — INFLUENZA VAC A&B SA ADJ QUAD 0.5 ML IM PRSY
0.5000 mL | PREFILLED_SYRINGE | INTRAMUSCULAR | Status: AC
  Administered 2022-05-30: 0.5 mL via INTRAMUSCULAR
  Filled 2022-05-29: qty 0.5

## 2022-05-29 MED ORDER — PHENOL 1.4 % MT LIQD
1.0000 | OROMUCOSAL | Status: DC | PRN
  Administered 2022-05-29 – 2022-06-22 (×12): 1 via OROMUCOSAL
  Filled 2022-05-29: qty 177

## 2022-05-29 MED ORDER — TROLAMINE SALICYLATE 10 % EX CREA
TOPICAL_CREAM | Freq: Two times a day (BID) | CUTANEOUS | Status: DC | PRN
  Administered 2022-05-29: 1 via TOPICAL
  Filled 2022-05-29 (×2): qty 85

## 2022-05-29 MED ORDER — KETOROLAC TROMETHAMINE 15 MG/ML IJ SOLN
15.0000 mg | Freq: Once | INTRAMUSCULAR | Status: AC
  Administered 2022-05-29: 15 mg via INTRAVENOUS
  Filled 2022-05-29: qty 1

## 2022-05-29 NOTE — Progress Notes (Signed)
Progress Note   Patient: Calvin [name] XKG:[id number] DOB: Apr 14, 1951 DOA: 05/28/2022     1 DOS: the patient was seen and examined on 05/29/2022   Brief hospital course: Taken from H&P.  [name] is a 71 y.o. male with medical history significant of Ogilvie's syndrome, ileus, hypertension, hyperlipidemia, COPD, stroke with mild left-sided weakness, GERD, gout, depression with anxiety, seizure, CAD, MI,  alcohol abuse, CKD stage IIIa, dCHF, colon cancer, thyroid cancer, who presents from his facility with Nausea, vomiting, abdominal distention, abdominal pain for 2 days. Found to have new small bowel obstruction on CT abdomen with transition point in the right lower abdomen suspicious for adhesion. General surgery was consulted and NG tube was placed.  Patient also developed left-sided tingling and numbness and facial droop while in ED, has an history of left-sided stroke with residual weakness.  Symptoms resolved within few minutes. CT head and MRI brain was negative for any acute intracranial abnormality and do reflect a chronic changes. 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 Neurology was consulted-patient to complete stroke work-up.  9/25: Labs with mildly elevated creatinine at 1.26 with baseline seems to be around 1.1-1.2.  Lipid panel normal with LDL of 57 CBC with mild leukocytosis at 11.1.  Hemoglobin stable,A1c 5.8 Repeat abdominal x-ray with stable obstructive findings and no other acute abnormality.  Continued to have significant colonic distention.  Passing flatus, minimum secretions with NG tube. general surgery would like to continue with conservative management with NG tube. Speech and cognition appears to be at baseline per speech evaluation. Patient uses quite a bit of opioids for chronic pain which can be contributory.  Try avoiding opioids if possible. We can try a Toradol as needed for pain with a close monitoring of renal function.   Assessment and  Plan: * SBO (small bowel obstruction) (HCC) CT showed new small-bowel obstruction, with transition point in right lower abdomen, suspicious for adhesion. Pt has severe abdominal distention.  Consulted Dr. Dahlia Byes of surgery, advising conservative management with NG tube.  Repeat x-ray with no significant change.  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 -Continue with NG tube suctioning. -Avoid opioids if possible as there can be contributory -Supportive care  Stroke [facility]) Pt has hx of stroke with left sided weakness. Today, pt developed new strokelike symptoms, including left arm numbness, left facial droop, slurred speech.  CT head and MRI negative for any acute abnormality. CTA with chronic vertebral occlusion. Speech and swallow evaluation with no specific recommendations as patient is at baseline. Neurology was consulted. -Continue to monitor  Chronic diastolic CHF (congestive heart failure) ([place]) 2D echo on 08/09/2019 showed EF of 55 to 60% with grade 1 diastolic dysfunction.  Patient does not have leg edema or JVD.  CHF seem to be compensated.  BNP within normal limit at 74 -Hold Lasix -Continue to monitor  Seizures (HCC) Seizure -Seizure precaution -When necessary Ativan for seizure -Home p.o. Keppra is being switched with IV 1000 mg twice daily. -Continue to monitor    HTN (hypertension) -hold oral blood pressure medications -IV hydralazine as needed  CAD (coronary artery disease) No chest pain.  Troponin negative x2 -Hold aspirin and Zocor due to NG tube placement  HLD (hyperlipidemia) - Hold Zocor  Chronic kidney disease, stage 3a ([place]) Renal function stable.  GFR> 60. -Follow-up by BMP  COPD (chronic obstructive pulmonary disease) (HCC) Stable -As needed albuterol and bronchodilators  Coffee ground emesis Coffee-ground emesis was reported, but hemoglobin  stable. Hemoglobin 14.9. -IV Protonix 40 mg twice daily -Follow-up CBC every  8 hours -PT/OT and type screen  Tobacco abuse -nicotine patch  Depression with anxiety Hold home oral Xanax and Zoloft -As needed IV Ativan for anxiety   Subjective: Patient denies any abdominal pain, nausea or vomiting.  He was asking about when to remove this NG tube.  Passing flatus.  Last bowel movement was yesterday morning.  Physical Exam: Vitals:   05/29/22 0100 05/29/22 0310 05/29/22 0819 05/29/22 1200  BP:  134/82 (!) 144/87   Pulse:  87 93   Resp:  17 20   Temp:  98.3 F (36.8 C) 99.7 F (37.6 C)   TempSrc:  Oral    SpO2:  100% 97%   Weight:    95.1 kg  Height: '5\' 11"'$  (1.803 m)      General.  Ill-appearing gentleman, in no acute distress. Pulmonary.  Lungs clear bilaterally, normal respiratory effort. CV.  Regular rate and rhythm, no JVD, rub or murmur. Abdomen.  Soft, nontender,distended, BS hypoactive. CNS.  Alert and oriented .  No focal neurologic deficit. Extremities.  No edema, no cyanosis, pulses intact and symmetrical. Psychiatry.  Appears to have some cognitive impairment.  Data Reviewed: Prior data reviewed  Family Communication: Discussed with brother at bedside  Disposition: Status is: Inpatient Remains inpatient appropriate because: Severity of illness   Planned Discharge Destination: Skilled nursing facility  Time spent: 50 minutes  This record has been created using Systems analyst. Errors have been sought and corrected,but may not always be located. Such creation errors do not reflect on the standard of care.  Author: Lorella Nimrod, MD 05/29/2022 1:04 PM  For on call review [email or web address].

## 2022-05-29 NOTE — Progress Notes (Addendum)
SLP Cancellation Note  Patient Details Name: Calvin [name] MRN: [id number] DOB: [birth year]   Cancelled treatment:       Reason Eval/Treat Not Completed: SLP screened, no needs identified, will sign off Pt with minimal engagement with therapist this AM for discussion of current function in comparison to baseline. Speech grossly intelligible for what was witnessed. Pt reported that speech/language was consistent with baseline. Brother present for screen; brother in agreement with pt regarding current baseline status. SLP to sign off. MD aware of results. Please re-consult in the setting of acute change.   Thank you  [place] Jesyka Slaght Clapp  MS CCC-SLP  [name] 05/29/2022, 12:48 PM

## 2022-05-29 NOTE — Plan of Care (Signed)
?  Problem: Coping: ?Goal: Level of anxiety will decrease ?Outcome: Progressing ?  ?Problem: Safety: ?Goal: Ability to remain free from injury will improve ?Outcome: Progressing ?  ?

## 2022-05-29 NOTE — Evaluation (Signed)
Physical Therapy Evaluation Patient Details Name: Calvin [name] MRN: [id number] DOB: March 29, 1951 Today's Date: 05/29/2022  History of Present Illness  Pt is 43 YOM admitted for small bowel obstruction and abdominal distention. PMH includes: CVA w L sided weakness, dCHF, seizures, CAD, HLD, CKD3a, COPD, tobacco abuse, depression, anxiety, and ogilive's syndrome.  Clinical Impression  Pt presents to PT in bed and requires significant encouragement to participate in therapy services. Able to participate in supine therapeutic exercises, transfers, and EOB amb activities. Pt required some physical assistance for all aspects of functional mobility, as well as cueing for sequencing and management of RW during amb tasks. Would benefit from skilled PT at SNF to address above deficits in strength, functional mobility, activity tolerance, and balance to promote optimal return to PLOF.        Recommendations for follow up therapy are one component of a multi-disciplinary discharge planning process, led by the attending physician.  Recommendations may be updated based on patient status, additional functional criteria and insurance authorization.  Follow Up Recommendations Skilled nursing-short term rehab (<3 hours/day) Can patient physically be transported by private vehicle: No    Assistance Recommended at Discharge Frequent or constant Supervision/Assistance  Patient can return home with the following  A little help with walking and/or transfers;Assistance with cooking/housework;Direct supervision/assist for financial management;[name];A lot of help with walking and/or transfers;Assist for transportation;Direct supervision/assist for medications management    Equipment Recommendations None recommended by PT  Recommendations for Other Services       Functional Status Assessment Patient has had a recent decline in their functional status and demonstrates the ability  to make significant improvements in function in a reasonable and predictable amount of time.     Precautions / Restrictions Precautions Precautions: Other (comment);Fall Precaution Comments: Seizure precautions Restrictions Weight Bearing Restrictions: No      Mobility  Bed Mobility Overal bed mobility: Needs Assistance Bed Mobility: Supine to Sit, Sit to Supine     Supine to sit: Min assist (assist w rotation to square hips at EOB) Sit to supine: Mod assist (LE negotiation modA)        Transfers Overall transfer level: Needs assistance Equipment used: Rolling walker (2 wheels) Transfers: Sit to/from Stand Sit to Stand: Min assist           General transfer comment: cueing required for sequencing    Ambulation/Gait Ambulation/Gait assistance: Min assist Gait Distance (Feet): 3 Feet Assistive device: Rolling walker (2 wheels) Gait Pattern/deviations: Trunk flexed Gait velocity: decr     General Gait Details: slow, effortful side steps towards [facility]  Stairs            Wheelchair Mobility    Modified Rankin (Stroke Patients Only)       Balance Overall balance assessment: Needs assistance Sitting-balance support: Bilateral upper extremity supported, Feet unsupported Sitting balance-Leahy Scale: Fair     Standing balance support: Bilateral upper extremity supported, During functional activity, Reliant on assistive device for balance Standing balance-Leahy Scale: Poor                               Pertinent Vitals/Pain Pain Assessment Pain Assessment: 0-10 Pain Score: 9  Pain Location: reports pain all over. burning pain in L hand, headache Pain Descriptors / Indicators: Burning, Constant, Headache Pain Intervention(s): Limited activity within patient's tolerance, Monitored during session, Patient requesting pain meds-RN notified    Home Living Family/patient expects  to be discharged to:: Assisted living                 Home  Equipment: Wheelchair - Publishing copy (2 wheels) Additional Comments: Pt reports he lives at [place] Prior Level of Function : Needs assist  Cognitive Assist : ADLs (cognitive)   ADLs (Cognitive): Intermittent cues Physical Assist : Mobility (physical) Mobility (physical): Bed mobility;Transfers;Gait   Mobility Comments: Per staff at [facility], prior to admission he could complete bed mobility without assistance & uses w/c for mobility outside of PT sessions. Per staff at facility, Able to amb 32f w PT assist at ALF. ADLs Comments: difficult to determine PLOF with self care.    Hand Dominance   Dominant Hand: Right    Extremity/Trunk Assessment   Upper Extremity Assessment Upper Extremity Assessment: Difficult to assess due to impaired cognition    Lower Extremity Assessment Lower Extremity Assessment: Difficult to assess due to impaired cognition LLE Deficits / Details: L sided weakness due to prior stroke    Cervical / Trunk Assessment Cervical / Trunk Assessment: Kyphotic  Communication   Communication: HOH  Cognition Arousal/Alertness: Awake/alert Behavior During Therapy: Agitated Overall Cognitive Status: History of cognitive impairments - at baseline                                 General Comments: Poor historian        General Comments      Exercises Other Exercises Other Exercises: forward and retro steps at EOB   Assessment/Plan    PT Assessment Patient needs continued PT services  PT Problem List Decreased strength;Decreased range of motion;Decreased activity tolerance;Decreased balance;Decreased mobility;Decreased cognition;Decreased knowledge of use of DME;Pain       PT Treatment Interventions DME instruction;Therapeutic exercise;Gait training;Balance training;Neuromuscular re-education;Functional mobility training;Cognitive remediation;Therapeutic activities;Patient/family education    PT Goals (Current  goals can be found in the Care Plan section)  Acute Rehab PT Goals PT Goal Formulation: Patient unable to participate in goal setting    Frequency Min 2X/week     Co-evaluation               AM-PAC PT "6 Clicks" Mobility  Outcome Measure Help needed turning from your back to your side while in a flat bed without using bedrails?: A Little Help needed moving from lying on your back to sitting on the side of a flat bed without using bedrails?: A Little Help needed moving to and from a bed to a chair (including a wheelchair)?: A Little Help needed standing up from a chair using your arms (e.g., wheelchair or bedside chair)?: A Little Help needed to walk in hospital room?: A Lot Help needed climbing 3-5 steps with a railing? : A Lot 6 Click Score: 16    End of Session Equipment Utilized During Treatment: Gait belt Activity Tolerance: Treatment limited secondary to agitation Patient left: in bed;with call bell/phone within reach;with bed alarm set   PT Visit Diagnosis: Muscle weakness (generalized) (M62.81);Difficulty in walking, not elsewhere classified (R26.2)    Time: [address]PT Time Calculation (min) (ACUTE ONLY): 43 min   Charges:             MGlenice LaineMPH, SPT 05/29/22, 2:57 PM

## 2022-05-29 NOTE — Consult Note (Signed)
[facility] SURGICAL CONSULTATION NOTE (initial) - cpt: 60630   HISTORY OF PRESENT ILLNESS (HPI):  71 y.o. male presented to [facility] ED yesterday for evaluation of abdominal pain. Patient reports a 2-3 day history of progressively worsening abdominal pain and distension. Pain was periumbilical. This has been accompanied with nausea and emesis. Nothing seemed to make this better. Unable to tolerate PO intake. He does report continuing to pass gas and last BM was yesterday. No fever, chills, cough, CP, SOB, or urinary changes. He, nor his family at bedside, are very clear about his abdominal surgeries but they believe he had a procedure many years ago for colon cancer. Of note, he has significant comorbid conditions including Ogilvie's syndrome, HTN, HLD, COPD, stroke, GERD, CAD, MI, CKD, CHF. Work up in the ED revealed normal WBC at 9.8K, Hgb normal at 14.9, renal function normal with sCr - 1.18, no electrolyte derangements, normal lactic acid level at 1.2. He did undergo CT Abdomen/Pelvis which was concerning for possible SBO with similar appearance of colonic distension. Of note, he also developed left facial numbness and slurred speech in the ED. He underwent stroke work up with CT, MRI which did not show any acute changes. Neurology on board as well thought to potentially be TIA vs seizure in setting of acute illness. He was admitted to the medicine service. NGT placed as well.   Surgery is consulted by hospitalist physician Dr. Ivor Costa, MD in this context for evaluation and management of SBO.  This morning, he reports that he is feeling well from an abdominal stand point. No reports of pain this morning. His biggest complaint is discomfort from NGT. No nausea or emesis. He reports flatus. Additionally complaining of left hand burning, no weakness.   PAST MEDICAL HISTORY (PMH):  Past Medical History:  Diagnosis Date   Alcohol abuse    drinks on weekend   Anemia    Anxiety     Arthritis    Cancer ([place])    colon,throat   COPD (chronic obstructive pulmonary disease) ([place])    Coronary artery disease    Depression    Gout    Hypertension    Myocardial infarction ([place])    Neuromuscular disorder ([place])    Seizures ([place])    last 6 months ago   Stroke [facility])    multiple  left side weakness   Tremors of nervous system      PAST SURGICAL HISTORY ([place]):  Past Surgical History:  Procedure Laterality Date   CARPAL TUNNEL RELEASE Left 10/19/2015   Procedure: CARPAL TUNNEL RELEASE;  Surgeon: Hessie Knows, MD;  Location: [facility] ORS;  Service: Orthopedics;  Laterality: Left;   COLON SURGERY     COLONOSCOPY WITH PROPOFOL N/A 10/26/2021   Procedure: COLONOSCOPY WITH PROPOFOL;  Surgeon: Lesly Rubenstein, MD;  Location: [facility] ENDOSCOPY;  Service: Endoscopy;  Laterality: N/A;   JOINT REPLACEMENT     left partial hip    THROAT SURGERY  2013   cancer     MEDICATIONS:  Prior to Admission medications   Medication Sig Start Date End Date Taking? Authorizing Provider  acetaminophen (TYLENOL) 325 MG tablet Take 650 mg by mouth every 6 (six) hours.   Yes [provider]  acidophilus (RISAQUAD) CAPS capsule Take 1 capsule by mouth daily.   Yes [provider]  ALPRAZolam (XANAX) 0.25 MG tablet Take 0.25 mg by mouth daily.   Yes [provider]  ALPRAZolam Duanne Moron) 0.5 MG tablet Take 0.5 mg by  mouth 2 (two) times daily.   Yes [provider]  aspirin 81 MG chewable tablet Take 81 mg by mouth daily.   Yes [provider]  bisacodyl (DULCOLAX) 10 MG suppository Place 10 mg rectally in the morning.   Yes [provider]  bisacodyl (DULCOLAX) 5 MG EC tablet Take 5 mg by mouth daily.   Yes [provider]  Calcium Carbonate-Vit D-Min (CALCIUM 600+D PLUS MINERALS) 600-400 MG-UNIT TABS Take 1 tablet by mouth 2 (two) times daily.   Yes [provider]  furosemide (LASIX) 20 MG tablet Take 1 tablet (20 mg total) by mouth  daily. 11/02/21  Yes Wouk, Ailene Rud, MD  gabapentin (NEURONTIN) 100 MG capsule Take 100 mg by mouth in the morning. (Take only with morning '800mg'$  dose to equal '900mg'$  total)   Yes [provider]  gabapentin (NEURONTIN) 400 MG capsule Take 2 capsules (800 mg total) by mouth 3 (three) times daily. 10/02/17  Yes Max Sane, MD  guaiFENesin (MUCINEX) 600 MG 12 hr tablet Take 600 mg by mouth 2 (two) times daily.   Yes [provider]  HYDROcodone-acetaminophen (NORCO/VICODIN) 5-325 MG tablet Take 0.5 tablets by mouth every 12 (twelve) hours. 11/02/21  Yes Wouk, Ailene Rud, MD  lactulose [facility]) 10 GM/15ML solution Take 20 g by mouth daily.   Yes [provider]  levETIRAcetam (KEPPRA) 100 MG/ML solution Take 1,000 mg by mouth 2 (two) times daily.   Yes [provider]  Lidocaine HCl 4 % CREA Apply 1 application topically daily. Apply from left side of face, down to left side of body to left foot   Yes [provider]  lisinopril (ZESTRIL) 5 MG tablet Take 1 tablet (5 mg total) by mouth daily. 08/14/19  Yes Dhungel, Nishant, MD  melatonin 3 MG TABS tablet Take 6 mg by mouth at bedtime.   Yes [provider]  metoCLOPramide (REGLAN) 5 MG tablet Take 5 mg by mouth 4 (four) times daily.   Yes [provider]  metoprolol succinate (TOPROL-XL) 25 MG 24 hr tablet Take 25 mg by mouth daily.   Yes [provider]  nystatin (MYCOSTATIN/NYSTOP) powder Apply 1 application topically 2 (two) times daily as needed (groin rash).   Yes [provider]  Omega-3 1000 MG CAPS Take 1,000 mg by mouth daily.   Yes [provider]  pantoprazole (PROTONIX) 20 MG tablet Take 20 mg by mouth in the morning.   Yes [provider]  polyethylene glycol (MIRALAX / GLYCOLAX) 17 g packet Take 34 g by mouth daily. 11/02/21  Yes Wouk, Ailene Rud, MD  promethazine [facility]) 25 MG/ML injection Inject 25 mg into the vein every 4 (four)  hours as needed for nausea or vomiting.   Yes [provider]  senna (SENOKOT) 8.6 MG TABS tablet Take 1 tablet (8.6 mg total) by mouth at bedtime. Patient taking differently: Take 1 tablet by mouth every evening. 09/20/19  Yes Veronese, [place], MD  sertraline (ZOLOFT) 50 MG tablet Take 50 mg by mouth daily.   Yes [provider]  simvastatin (ZOCOR) 10 MG tablet Take 10 mg by mouth at bedtime.    Yes [provider]  tamsulosin (FLOMAX) 0.4 MG CAPS capsule Take 0.4 mg by mouth every evening.   Yes [provider]  tiotropium (SPIRIVA) 18 MCG inhalation capsule Place 18 mcg into inhaler and inhale daily.   Yes [provider]  Wheat Dextrin (BENEFIBER DRINK MIX PO) Take 5 g  by mouth daily.   Yes [provider]  Zinc Oxide 25 % PSTE Apply 1 Application topically 3 (three) times daily as needed (peri area).   Yes [provider]  alum & mag hydroxide-simeth (MAALOX/MYLANTA) 200-200-20 MG/5ML suspension Take 30 mLs by mouth every 12 (twelve) hours as needed for indigestion or heartburn.    [provider]  B Complex Vitamins (VITAMIN B-COMPLEX) TABS Take 1 tablet by mouth daily. Patient not taking: Reported on 05/28/2022 06/09/21   [provider]  B Complex-C (B-COMPLEX WITH VITAMIN C) tablet Take 1 tablet by mouth daily. Patient not taking: Reported on 05/28/2022    [provider]  clindamycin-benzoyl peroxide (BENZACLIN) gel Apply 1 application topically every morning. Patient not taking: Reported on 05/28/2022 10/03/21   [provider]  Dextromethorphan-Benzocaine (CEPACOL SORE THROAT & COUGH) 5-7.5 MG LOZG Use as directed 1 lozenge in the mouth or throat every 3 (three) hours as needed (sore throat).    [provider]  levETIRAcetam (KEPPRA) 500 MG tablet Take 1,000 mg by mouth 2 (two) times daily. Patient not taking: Reported on 05/28/2022    [provider]  Lidocaine 4 % PTCH  Apply 1 patch topically at bedtime. Apply to left side of neck daily at bedtime for 5 days Patient not taking: Reported on 05/28/2022    [provider]  Menthol, Topical Analgesic, (BIOFREEZE) 4 % GEL Apply 1 application topically daily. (Apply to left side of face and body)    [provider]  nitroGLYCERIN (NITROSTAT) 0.4 MG SL tablet Place 0.4 mg under the tongue every 5 (five) minutes as needed for chest pain.    [provider]  ondansetron (ZOFRAN-ODT) 4 MG disintegrating tablet Take 4 mg by mouth every 8 (eight) hours as needed for nausea or vomiting.    [provider]     ALLERGIES:  No Known Allergies   SOCIAL HISTORY:  Social History   Socioeconomic History   Marital status: Single    Spouse name: Not on file   Number of children: Not on file   Years of education: Not on file   Highest education level: Not on file  Occupational History   Not on file  Tobacco Use   Smoking status: Every Day   Smokeless tobacco: Never  Substance and Sexual Activity   Alcohol use: Yes    Comment: weekends   Drug use: No   Sexual activity: Not on file  Other Topics Concern   Not on file  Social History Narrative   Not on file   Social Determinants of Health   Financial Resource Strain: Not on file  Food Insecurity: Not on file  Transportation Needs: Not on file  Physical Activity: Not on file  Stress: Not on file  Social Connections: Not on file  Intimate Partner Violence: Not on file     FAMILY HISTORY:  Family History  Problem Relation Age of Onset   Cancer Mother    Hypertension Father    Leukemia Brother       REVIEW OF SYSTEMS:  Review of Systems  Constitutional:  Negative for chills and fever.  HENT:  Negative for congestion and sore throat.   Respiratory:  Negative for cough and shortness of breath.   Cardiovascular:  Negative for chest pain and palpitations.  Gastrointestinal:  Positive for abdominal pain, nausea and  vomiting. Negative for constipation and diarrhea.  Neurological:  Positive for sensory change. Negative for focal weakness.  All  other systems reviewed and are negative.   VITAL SIGNS:  Temp:  [97.8 F (36.6 C)-99.7 F (37.6 C)] 99.7 F (37.6 C) (09/25 0819) Pulse Rate:  [79-108] 93 (09/25 0819) Resp:  [13-22] 20 (09/25 0819) BP: (102-154)/(69-98) 144/87 (09/25 0819) SpO2:  [94 %-100 %] 97 % (09/25 0819)     Height: '5\' 11"'$  (180.3 cm)       INTAKE/OUTPUT:  09/24 0701 - 09/25 0700 In: 44.4 [IV Piggyback:44.4] Out: 750 [Urine:750]  PHYSICAL EXAM:  Physical Exam Vitals and nursing note reviewed. Exam conducted with a chaperone present.  Constitutional:      General: He is not in acute distress.    Appearance: He is well-developed. He is not ill-appearing.     Comments: Patient resting in bed, NAD, family at bedside   HENT:     Head: Normocephalic and atraumatic.     Comments: NGT in place; minimal output  Eyes:     General: No scleral icterus.    Extraocular Movements: Extraocular movements intact.  Cardiovascular:     Rate and Rhythm: Normal rate.     Heart sounds: Normal heart sounds.  Pulmonary:     Effort: Pulmonary effort is normal. No respiratory distress.     Comments: On [place] Abdominal:     General: Abdomen is protuberant. There is distension.     Palpations: Abdomen is soft.     Tenderness: There is no abdominal tenderness. There is no guarding or rebound.     Comments: Abdomen is markedly distended, tympanic which is consistent with known history of colonic pseudo obstruction. He does not appear tender. No evidence of peritonitis   Genitourinary:    Comments: Deferred Skin:    General: Skin is warm and dry.     Coloration: Skin is not jaundiced or pale.  Neurological:     General: No focal deficit present.     Mental Status: He is alert.     Cranial Nerves: No cranial nerve deficit.     Comments: Strength 5/5 in upper extremities bilaterally No gross deficit  CN II-XII He is not the best participant   Psychiatric:        Mood and Affect: Mood normal.        Behavior: Behavior normal.      Labs:     Latest Ref Rng & Units 05/29/2022    8:24 AM 05/29/2022    7:42 AM 05/28/2022    6:17 PM  CBC  WBC 4.0 - 10.5 K/uL 10.4  11.1  9.6   Hemoglobin 13.0 - 17.0 g/dL 13.9  14.0  14.2   Hematocrit 39.0 - 52.0 % 44.0  43.8  45.6   Platelets 150 - 400 K/uL 195  192  210       Latest Ref Rng & Units 05/29/2022    7:42 AM 05/28/2022   12:08 PM 01/10/2022    3:35 PM  CMP  Glucose 70 - 99 mg/dL 112  122  94   BUN 8 - 23 mg/dL '13  13  18   '$ Creatinine 0.61 - 1.24 mg/dL 1.26  1.18  1.23   Sodium 135 - 145 mmol/L 141  140  140   Potassium 3.5 - 5.1 mmol/L 4.3  4.6  5.2   Chloride 98 - 111 mmol/L 108  107  111   CO2 22 - 32 mmol/L '26  22  25   '$ Calcium 8.9 - 10.3 mg/dL 9.1  9.2  9.3   Total Protein  6.5 - 8.1 g/dL 7.3  7.8    Total Bilirubin 0.3 - 1.2 mg/dL 0.8  1.0    Alkaline Phos 38 - 126 U/L 61  64    AST 15 - 41 U/L 18  28    ALT 0 - 44 U/L 13  17       Imaging studies:   CT Abdomen/Pelvis (05/28/2022) personally reviewed which shows stable appearance of known colonic pseudo obstruction, there is new small bowel dilation, no free air, and radiologist report reviewed below:  IMPRESSION: New small-bowel obstruction, with transition point in right lower abdomen suspicious for adhesion.   No significant change in severe diffuse colonic dilatation, consistent with chronic colonic ileus or pseudo-obstruction.   Cholelithiasis. No radiographic evidence of cholecystitis.   Stable small hiatal hernia.   Aortic Atherosclerosis (ICD10-I70.0).   KUB (05/29/2022 - most recent) personally reviewed, NGT appears in good position, still with colonic distension through, stable, but difficult to assess small bowel. Radiologist report reviewed below:  IMPRESSION: 1. Satisfactory enteric tube placement in the stomach. 2. No pneumoperitoneum. Bowel-gas  pattern not significantly changed, with dominant finding of chronically dilated large bowel. 3. Low lung volumes with no acute cardiopulmonary abnormality.   Assessment/Plan: (ICD-10's: K76.609) 71 y.o. male with abdominal pain, distension, nausea, and emesis thought to be secondary to possible small bowel obstruction also with stable appearance of chronic colonic distension, complicated by significant comorbid disease.    - Small bowel obstruction is certainly possible given his surgical history although he has continue to have evidence of bowel function throughout duration of symptoms and NGT out is minimal. He continues to have an unchanged appearance of his chronic colonic dilation, possible Ogilvie.  - I think it is reasonable to continue NGT decompression for now; monitor and record output  - No emergent surgical intervention. He is a sub optimal candidate for any surgical intervention given his deconditioning and significant comorbid disease  - Monitor abdominal examination; on-going bowel function - Consider serial KUBs   - Pain control prn; limit narcotics - Antiemetics prn - Mobilize as feasible  - Appreciate neurology input - Further management per primary service    All of the above findings and recommendations were discussed with the patient and family at bedside, and all of their questions were answered to their expressed satisfaction.  Thank you for the opportunity to participate in this patient's care.   -- Edison Simon, PA-C [facility] 05/29/2022, 9:58 AM M-F: 7am - 4pm

## 2022-05-29 NOTE — Consult Note (Signed)
Consultation  Referring Provider:     Dr Reesa Chew Admit date 05/28/22 Consult date   05/29/22      Reason for Consultation:     SBO         HPI:   Calvin [name] is a 71 y.o. male with medical history significant of Ogilvie's syndrome, ileus, hypertension, hyperlipidemia, COPD, stroke with mild left-sided weakness, GERD, gout, depression with anxiety, seizure, CAD, MI,  alcohol abuse, CKD stage IIIa, dCHF, colon cancer, thyroid cancer, and chronic opioid/bzd use who presented to ED yesterday with acute on chronic abdominal pain, NV x 2d- found to have concern for new SBO in addition to his chronically dilated large bowel. There was also noted an episode of possible hematemesis. Surgery was consulted, note appreciated. NGT placed and GI has been consulted for help with mgmt. Note today had had 3-way abdominal films without any definite small bowel dilation, large bowel findings are similar to yesterday's. There is no pneumoperitoneum. Patient is a very difficult historian. There is no family at bedside. History is gained from chart and nurse. Patient reports he had a bm this morning- formed- however nurse is unaware of this- there is a small amount of brown fecal material in his commode. Endorses flatus this afternoon. States he is no better or worse from yesterday. States his abdomen still hurts. He has been receiving bid narcotics and xanax at his care facility.  It appears his ogilvies dates back several years. He has had several imaging studies this year which demonstrate stable large bowel chronic dilation. He underwent attempt of colonic decompression earlier this year as below. He denies any further GI concerns. Serial cbc's are unremarkable x 3. Liver ennzymes normal. Wbc normal.  It is noted he also had some sensory changes concerning for TIA on admission- has been seen by neuro and has some cspine stenosis as well. He also has chronic encephalomacia.   PREVIOUS ENDOSCOPIES:            Colonsocopy  10/26/21- Dr Haig Prophet- poor prep- colonic lumen dilated. Air was aspirated but unable to be fully decompressed. There was a few nonbleeding polyps but not removed due to clinical situation- to consider o/p colonoscopy if benefit would outweigh risk.  Details regarding his colon cancer history are not available- this evidently was more than 10y ago  Past Medical History:  Diagnosis Date   Alcohol abuse    drinks on weekend   Anemia    Anxiety    Arthritis    Cancer ([place])    colon,throat   COPD (chronic obstructive pulmonary disease) ([place])    Coronary artery disease    Depression    Gout    Hypertension    Myocardial infarction [facility])    Neuromuscular disorder ([place])    Seizures ([place])    last 6 months ago   Stroke [facility])    multiple  left side weakness   Tremors of nervous system     Past Surgical History:  Procedure Laterality Date   CARPAL TUNNEL RELEASE Left 10/19/2015   Procedure: CARPAL TUNNEL RELEASE;  Surgeon: Hessie Knows, MD;  Location: [facility] ORS;  Service: Orthopedics;  Laterality: Left;   COLON SURGERY     COLONOSCOPY WITH PROPOFOL N/A 10/26/2021   Procedure: COLONOSCOPY WITH PROPOFOL;  Surgeon: Lesly Rubenstein, MD;  Location: [facility] ENDOSCOPY;  Service: Endoscopy;  Laterality: N/A;   JOINT REPLACEMENT     left partial hip    THROAT SURGERY  2013   cancer  Family History  Problem Relation Age of Onset   Cancer Mother    Hypertension Father    Leukemia Brother      Social History   Tobacco Use   Smoking status: Every Day   Smokeless tobacco: Never  Substance Use Topics   Alcohol use: Yes    Comment: weekends   Drug use: No    Prior to Admission medications   Medication Sig Start Date End Date Taking? Authorizing Provider  acetaminophen (TYLENOL) 325 MG tablet Take 650 mg by mouth every 6 (six) hours.   Yes [provider]  acidophilus (RISAQUAD) CAPS capsule Take 1 capsule by mouth daily.   Yes [provider]  ALPRAZolam (XANAX)  0.25 MG tablet Take 0.25 mg by mouth daily.   Yes [provider]  ALPRAZolam Duanne Moron) 0.5 MG tablet Take 0.5 mg by mouth 2 (two) times daily.   Yes [provider]  aspirin 81 MG chewable tablet Take 81 mg by mouth daily.   Yes [provider]  bisacodyl (DULCOLAX) 10 MG suppository Place 10 mg rectally in the morning.   Yes [provider]  bisacodyl (DULCOLAX) 5 MG EC tablet Take 5 mg by mouth daily.   Yes [provider]  Calcium Carbonate-Vit D-Min (CALCIUM 600+D PLUS MINERALS) 600-400 MG-UNIT TABS Take 1 tablet by mouth 2 (two) times daily.   Yes [provider]  furosemide (LASIX) 20 MG tablet Take 1 tablet (20 mg total) by mouth daily. 11/02/21  Yes Wouk, Ailene Rud, MD  gabapentin (NEURONTIN) 100 MG capsule Take 100 mg by mouth in the morning. (Take only with morning '800mg'$  dose to equal '900mg'$  total)   Yes [provider]  gabapentin (NEURONTIN) 400 MG capsule Take 2 capsules (800 mg total) by mouth 3 (three) times daily. 10/02/17  Yes Max Sane, MD  guaiFENesin (MUCINEX) 600 MG 12 hr tablet Take 600 mg by mouth 2 (two) times daily.   Yes [provider]  HYDROcodone-acetaminophen (NORCO/VICODIN) 5-325 MG tablet Take 0.5 tablets by mouth every 12 (twelve) hours. 11/02/21  Yes Wouk, Ailene Rud, MD  lactulose [facility]) 10 GM/15ML solution Take 20 g by mouth daily.   Yes [provider]  levETIRAcetam (KEPPRA) 100 MG/ML solution Take 1,000 mg by mouth 2 (two) times daily.   Yes [provider]  Lidocaine HCl 4 % CREA Apply 1 application topically daily. Apply from left side of face, down to left side of body to left foot   Yes [provider]  lisinopril (ZESTRIL) 5 MG tablet Take 1 tablet (5 mg total) by mouth daily. 08/14/19  Yes Dhungel, Nishant, MD  melatonin 3 MG TABS tablet Take 6 mg by mouth at bedtime.   Yes [provider]  metoCLOPramide (REGLAN) 5 MG tablet Take 5 mg by mouth  4 (four) times daily.   Yes [provider]  metoprolol succinate (TOPROL-XL) 25 MG 24 hr tablet Take 25 mg by mouth daily.   Yes [provider]  nystatin (MYCOSTATIN/NYSTOP) powder Apply 1 application topically 2 (two) times daily as needed (groin rash).   Yes [provider]  Omega-3 1000 MG CAPS Take 1,000 mg by mouth daily.   Yes [provider]  pantoprazole (PROTONIX) 20 MG tablet Take 20 mg by mouth in the morning.   Yes [provider]  polyethylene glycol (MIRALAX / GLYCOLAX) 17 g packet Take 34 g by mouth daily. 11/02/21  Yes Wouk, Ailene Rud, MD  promethazine [facility])  25 MG/ML injection Inject 25 mg into the vein every 4 (four) hours as needed for nausea or vomiting.   Yes [provider]  senna (SENOKOT) 8.6 MG TABS tablet Take 1 tablet (8.6 mg total) by mouth at bedtime. Patient taking differently: Take 1 tablet by mouth every evening. 09/20/19  Yes Veronese, [place], MD  sertraline (ZOLOFT) 50 MG tablet Take 50 mg by mouth daily.   Yes [provider]  simvastatin (ZOCOR) 10 MG tablet Take 10 mg by mouth at bedtime.    Yes [provider]  tamsulosin (FLOMAX) 0.4 MG CAPS capsule Take 0.4 mg by mouth every evening.   Yes [provider]  tiotropium (SPIRIVA) 18 MCG inhalation capsule Place 18 mcg into inhaler and inhale daily.   Yes [provider]  Wheat Dextrin (BENEFIBER DRINK MIX PO) Take 5 g by mouth daily.   Yes [provider]  Zinc Oxide 25 % PSTE Apply 1 Application topically 3 (three) times daily as needed (peri area).   Yes [provider]  alum & mag hydroxide-simeth (MAALOX/MYLANTA) 200-200-20 MG/5ML suspension Take 30 mLs by mouth every 12 (twelve) hours as needed for indigestion or heartburn.    [provider]  B Complex Vitamins (VITAMIN B-COMPLEX) TABS Take 1 tablet by mouth daily. Patient not taking: Reported on 05/28/2022 06/09/21   [provider]  B Complex-C (B-COMPLEX WITH VITAMIN C) tablet Take 1 tablet by mouth daily. Patient not taking: Reported on 05/28/2022    [provider]  clindamycin-benzoyl peroxide (BENZACLIN) gel Apply 1 application topically every morning. Patient not taking: Reported on 05/28/2022 10/03/21   [provider]  Dextromethorphan-Benzocaine (CEPACOL SORE THROAT & COUGH) 5-7.5 MG LOZG Use as directed 1 lozenge in the mouth or throat every 3 (three) hours as needed (sore throat).    [provider]  levETIRAcetam (KEPPRA) 500 MG tablet Take 1,000 mg by mouth 2 (two) times daily. Patient not taking: Reported on 05/28/2022    [provider]  Lidocaine 4 % PTCH Apply 1 patch topically at bedtime. Apply to left side of neck daily at bedtime for 5 days Patient not taking: Reported on 05/28/2022    [provider]  Menthol, Topical Analgesic, (BIOFREEZE) 4 % GEL Apply 1 application topically daily. (Apply to left side of face and body)    [provider]  nitroGLYCERIN (NITROSTAT) 0.4 MG SL tablet Place 0.4 mg under the tongue every 5 (five) minutes as needed for chest pain.    [provider]  ondansetron (ZOFRAN-ODT) 4 MG disintegrating tablet Take 4 mg by mouth every 8 (eight) hours as needed for nausea or vomiting.    [provider]    Current Facility-Administered Medications  Medication Dose Route Frequency Provider Last Rate Last Admin   0.9 %  sodium chloride infusion   Intravenous Continuous Ivor Costa, MD 75 mL/hr at 05/29/22 1107 Infusion Verify at 05/29/22 1107   acetaminophen (TYLENOL) tablet 650 mg  650 mg Oral Q4H PRN Ivor Costa, MD       Or   acetaminophen (TYLENOL) 160 MG/5ML solution 650 mg  650 mg Per Tube Q4H PRN Ivor Costa, MD       Or   acetaminophen (TYLENOL) suppository 650 mg  650 mg Rectal Q4H PRN Ivor Costa, MD       albuterol (PROVENTIL) (2.5 MG/3ML) 0.083% nebulizer solution 2.5 mg  2.5 mg Nebulization  Q4H PRN Ivor Costa, MD  hydrALAZINE (APRESOLINE) injection 5 mg  5 mg Intravenous Q2H PRN Ivor Costa, MD       [START ON 05/30/2022] influenza vaccine adjuvanted (FLUAD) injection 0.5 mL  0.5 mL Intramuscular Tomorrow-1000 Lorella Nimrod, MD       levETIRAcetam (KEPPRA) IVPB 1000 mg/100 mL premix  1,000 mg Intravenous Q12H Bhagat, Srishti L, MD   Stopped at 05/29/22 0826   LORazepam (ATIVAN) injection 0.5 mg  0.5 mg Intravenous Q8H PRN Ivor Costa, MD       LORazepam (ATIVAN) injection 1 mg  1 mg Intravenous QHS Bhagat, Srishti L, MD   1 mg at 05/28/22 2350   LORazepam (ATIVAN) injection 1 mg  1 mg Intravenous Q2H PRN Ivor Costa, MD       morphine (PF) 2 MG/ML injection 2 mg  2 mg Intravenous Q4H PRN Ivor Costa, MD   2 mg at 05/28/22 1819   Muscle Rub CREA   Topical PRN Rometta Emery, RN   Given at 05/29/22 1033   nicotine (NICODERM CQ - dosed in mg/24 hours) patch 21 mg  21 mg Transdermal Daily Ivor Costa, MD       ondansetron [facility]) injection 4 mg  4 mg Intravenous Q8H PRN Ivor Costa, MD       pantoprazole (PROTONIX) injection 40 mg  40 mg Intravenous Q12H Ivor Costa, MD   40 mg at 05/29/22 0817   phenol (CHLORASEPTIC) mouth spray 1 spray  1 spray Mouth/Throat PRN Rometta Emery, RN   1 spray at 05/29/22 1222   senna-docusate (Senokot-S) tablet 1 tablet  1 tablet Oral QHS PRN Ivor Costa, MD       valproate (DEPACON) 250 mg in dextrose 5 % 50 mL IVPB  250 mg Intravenous Q6H Bhagat, Srishti L, MD 52.5 mL/hr at 05/29/22 1217 250 mg at 05/29/22 1217    Allergies as of 05/28/2022   (No Known Allergies)     Review of Systems:    All systems reviewed and negative except where noted in HPI with exception of chronic pain.     Physical Exam:  Vital signs in last 24 hours: Temp:  [97.8 F (36.6 C)-99.7 F (37.6 C)] 99.7 F (37.6 C) (09/25 0819) Pulse Rate:  [79-105] 93 (09/25 0819) Resp:  [13-22] 20 (09/25 0819) BP: (115-154)/(79-98) 144/87 (09/25 0819) SpO2:  [94 %-100 %] 97 %  (09/25 0819) Weight:  [95.1 kg] 95.1 kg (09/25 1200) Last BM Date : 05/26/22 General:   Pleasant man in NAD Head:  Normocephalic and atraumatic. Eyes:   No icterus.   Conjunctiva pink. Ears:  Normal auditory acuity. Mouth: Mucosa pink moist, no lesions. Neck:  Supple; no masses felt Lungs:  Respirations even and unlabored. Lungs clear to auscultation bilaterally.   No wheezes, crackles, or rhonchi.  Heart:  S1S2, RRR, no MRG. No edema. Abdomen:  NGT patent draining bilious fluid. Distended, soft, nondistended, nontender. There are some tympanitic bowel sounds. No appreciable masses or hepatomegaly. No rebound signs or other peritoneal signs. Rectal:  Not performed.  Msk:  MAEW x4, No clubbing or cyanosis. Strength 5/5. Symmetrical without gross deformities. Neurologic:  Alert and  oriented x4;  Cranial nerves II-XII intact.  Skin:  Warm, dry, pink without significant lesions or rashes. Psych:  Alert and cooperative. Normal affect.Vague answers to questions.  LAB RESULTS: Recent Labs    05/28/22 1817 05/29/22 0742 05/29/22 0824  WBC 9.6 11.1* 10.4  HGB 14.2 14.0 13.9  HCT 45.6 43.8 44.0  PLT 210  192 195   BMET Recent Labs    05/28/22 1208 05/29/22 0742  NA 140 141  K 4.6 4.3  CL 107 108  CO2 22 26  GLUCOSE 122* 112*  BUN 13 13  CREATININE 1.18 1.26*  CALCIUM 9.2 9.1   LFT Recent Labs    05/29/22 0742  PROT 7.3  ALBUMIN 4.0  AST 18  ALT 13  ALKPHOS 61  BILITOT 0.8   PT/INR Recent Labs    05/28/22 1208 05/29/22 0742  LABPROT 13.5 13.5  INR 1.0 1.0    STUDIES: DG ABD ACUTE 2+V W 1V CHEST  Result Date: 05/29/2022 CLINICAL DATA:  71 year old male with bowel obstruction. EXAM: DG ABDOMEN ACUTE WITH 1 VIEW CHEST COMPARISON:  CT Abdomen and Pelvis 05/28/2022 and earlier. FINDINGS: Upright AP view of the chest at 0708 hours. Enteric tube courses to the abdomen. Low lung volumes. Stable elevation of the right hemidiaphragm. Crowding of lung markings with no  pneumothorax, pneumoperitoneum, or confluent pulmonary opacity. Upright and supine views of the abdomen and pelvis. Enteric tube terminates in the stomach with side hole at the level of the gastric fundus. Chronic dilatation of gas containing large bowel, not significantly changed compared to CT Abdomen and Pelvis in May. Large bowel gas is present to the rectum. No definite small bowel dilatation visible. But overall the gas pattern is not significantly changed from the scout view yesterday. No pneumoperitoneum. No acute osseous abnormality identified. Left hip arthroplasty. Pelvic vascular calcifications. IMPRESSION: 1. Satisfactory enteric tube placement in the stomach. 2. No pneumoperitoneum. Bowel-gas pattern not significantly changed, with dominant finding of chronically dilated large bowel. 3. Low lung volumes with no acute cardiopulmonary abnormality. Electronically Signed   By: Genevie Ann M.D.   On: 05/29/2022 07:28   DG Abd Portable 1V  Result Date: 05/29/2022 CLINICAL DATA:  NG tube placement EXAM: PORTABLE ABDOMEN - 1 VIEW COMPARISON:  Radiographs 05/28/2022 FINDINGS: Enteric tube tip and side port within the expected location of the stomach. Redemonstrated marked gaseous dilation of the visualized bowel in the upper abdomen. Elevated right hemidiaphragm. IMPRESSION: Enteric tube in good position. Electronically Signed   By: Placido Sou M.D.   On: 05/29/2022 00:33   DG Abdomen 1 View  Result Date: 05/28/2022 CLINICAL DATA:  ng tube placement EXAM: ABDOMEN - 1 VIEW COMPARISON:  X-ray abdomen 10/31/2021, CT abdomen pelvis 05/28/2022 FINDINGS: Enteric tube courses below the diaphragm with side port overlying the expected region of the gastroesophageal junction and tip overlying the expected region of the gastric lumen. Gaseous dilatation of the visualized bowel within the upper abdomen. No radio-opaque calculi or other significant radiographic abnormality are seen. Elevation of the right  hemidiaphragm with atelectasis of the right base. IMPRESSION: 1. Enteric tube courses below the diaphragm with side port overlying the expected region of the gastroesophageal junction and tip overlying the expected region of the gastric lumen. Recommend advancing by 3 cm. 2. Gaseous dilatation of the visualized bowel within the upper abdomen. Electronically Signed   By: Iven Finn M.D.   On: 05/28/2022 22:41   MR BRAIN WO CONTRAST  Result Date: 05/28/2022 CLINICAL DATA:  Neuro deficit, acute, stroke suspected. Personal history of infarct with left-sided weakness. New left upper extremity numbness and left facial droop. Slurred speech. EXAM: MRI HEAD WITHOUT CONTRAST TECHNIQUE: Multiplanar, multiecho pulse sequences of the brain and surrounding structures were obtained without intravenous contrast. COMPARISON:  MR head without contrast 10/02/2021 FINDINGS: Brain: The diffusion-weighted images demonstrate no acute  or subacute infarction. Remote infarcts of the right occipital lobe and anterior right frontal lobe are stable. Moderate generalized atrophy is similar the prior exam. Periventricular white matter changes are also stable. The ventricles are proportionate to the degree of atrophy. No significant extraaxial fluid collection is present. Remote lacunar infarcts are present in the posterior left cerebellum. The brainstem and cerebellum are otherwise within normal limits. Vascular: Abnormal signal is present in the left vertebral artery, similar the prior study. Flow is present within the anterior circulation bilaterally. Flow is present the basilar artery. Skull and upper cervical spine: The craniocervical junction is normal. Upper cervical spine is within normal limits. Marrow signal is unremarkable. Sinuses/Orbits: The paranasal sinuses and mastoid air cells are clear. The globes and orbits are within normal limits. IMPRESSION: 1. No acute intracranial abnormality or significant interval change. 2.  Stable atrophy and white matter disease. This likely reflects the sequela of chronic microvascular ischemia. 3. Remote infarcts of the right occipital lobe and anterior right frontal lobe are stable. 4. Chronic occlusion of the left vertebral artery. Electronically Signed   By: San Morelle M.D.   On: 05/28/2022 17:34   MR CERVICAL SPINE WO CONTRAST  Result Date: 05/28/2022 CLINICAL DATA:  Myelopathy, chronic, cervical spine cervical myelopathy EXAM: MRI CERVICAL SPINE WITHOUT CONTRAST TECHNIQUE: Multiplanar, multisequence MR imaging of the cervical spine was performed. No intravenous contrast was administered. COMPARISON:  11/04/2018, 10/01/2017 FINDINGS: Alignment: Smooth reversal of the cervical lordosis. Grade 1 anterolisthesis of C4 on C5 and C6 on C7. Vertebrae: No fracture, evidence of discitis, or bone lesion. Cord: No definite cord signal abnormality. Posterior Fossa, vertebral arteries, paraspinal tissues: Chronic absence of the left vertebral artery flow void. No acute paraspinal abnormality. Disc levels: C2-C3: Disc osteophyte complex with bilateral facet arthropathy and prominent left-sided uncovertebral spurring. Moderate canal stenosis with severe left and mild right foraminal stenosis. Findings slightly progressed from prior. C3-C4: Disc osteophyte complex with bilateral facet and uncovertebral arthropathy. Moderate to severe canal stenosis with severe bilateral foraminal stenosis. Findings slightly progressed from prior. C4-C5: Mild disc osteophyte complex with bilateral facet arthropathy and uncovertebral spurring. Borderline-mild canal stenosis with mild-to-moderate bilateral foraminal stenosis. No significant progression. C5-C6: Disc osteophyte complex, eccentric to the left with mild facet and uncovertebral arthropathy. No canal stenosis. Mild-to-moderate bilateral foraminal stenosis. No significant progression. C6-C7: Mild disc uncovering with minimal disc bulge. Annular fissure in  the left foraminal zone. Mild bilateral facet arthropathy. Moderate left and mild right foraminal stenosis. No canal stenosis. Slight interval progression from prior. C7-T1: No disc protrusion. Mild facet arthropathy. No foraminal or canal stenosis. Unchanged. IMPRESSION: 1. Multilevel cervical spondylosis, most pronounced at the C3-4 level where there is moderate-to-severe canal stenosis and severe bilateral foraminal stenosis. Findings slightly progressed from prior MRI. 2. Moderate canal stenosis with severe left foraminal stenosis at C2-C3. 3. Chronically occluded left vertebral artery. Electronically Signed   By: Davina Poke D.O.   On: 05/28/2022 17:29   CT HEAD CODE STROKE WO CONTRAST  Result Date: 05/28/2022 CLINICAL DATA:  Code stroke. EXAM: CT HEAD WITHOUT CONTRAST TECHNIQUE: Contiguous axial images were obtained from the base of the skull through the vertex without intravenous contrast. RADIATION DOSE REDUCTION: This exam was performed according to the departmental dose-optimization program which includes automated exposure control, adjustment of the mA and/or kV according to patient size and/or use of iterative reconstruction technique. COMPARISON:  CT head without contrast 10/02/2021. MRI head without contrast 10/02/2021. FINDINGS: Brain: Remote right occipital and anterior  right frontal lobe infarcts are stable. Chronic encephalomalacia of the anterior inferior frontal lobes is again seen. Remote lacunar infarcts are present in the posterior left cerebellum. No acute infarct, hemorrhage, or mass lesion is present. Moderate generalized atrophy and white matter disease is stable. Basal ganglia are intact. Insular ribbon is normal. Brainstem and cerebellum are stable. Vascular: Vascular enhancement is secondary to contrast from an earlier study. Skull: Focal soft tissue density is present in the anterior left frontotemporal scalp. Extracranial soft tissues are otherwise within normal limits.  Calvarium is intact. No focal lytic or blastic lesions are present. Sinuses/Orbits: Chronic wall thickening is present in the right maxillary sinus. No active disease is present. A polyp or mucous retention cyst is again noted within the right sphenoid sinus. The paranasal sinuses and mastoid air cells are otherwise clear. The globes and orbits are within normal limits. ASPECTS [facility] Stroke Program Early CT Score) - Ganglionic level infarction (caudate, lentiform nuclei, internal capsule, insula, M1-M3 cortex): 7/7 - Supraganglionic infarction (M4-M6 cortex): 3/3 Total score (0-10 with 10 being normal): 10/10 . IMPRESSION: 1. No acute intracranial abnormality or significant interval change. 2. Stable remote infarcts involving the right occipital and anterior right frontal lobe. 3. Chronic encephalomalacia of the anterior inferior frontal lobes. 4. Remote lacunar infarcts of the left cerebellum. 5. Stable atrophy and white matter disease. 6. ASPECTS is 10/10. *The above was relayed via text pager to Dr. Curly Shores on 05/28/2022 at 15:34 Electronically Signed   By: San Morelle M.D.   On: 05/28/2022 15:37   CT ABDOMEN PELVIS W CONTRAST  Result Date: 05/28/2022 CLINICAL DATA:  Acute abdominal pain and vomiting, with coffee-ground emesis. EXAM: CT ABDOMEN AND PELVIS WITH CONTRAST TECHNIQUE: Multidetector CT imaging of the abdomen and pelvis was performed using the standard protocol following bolus administration of intravenous contrast. RADIATION DOSE REDUCTION: This exam was performed according to the departmental dose-optimization program which includes automated exposure control, adjustment of the mA and/or kV according to patient size and/or use of iterative reconstruction technique. CONTRAST:  125m OMNIPAQUE IOHEXOL 300 MG/ML  SOLN COMPARISON:  01/10/2022 and 09/20/2019 FINDINGS: Lower Chest: No acute findings. Hepatobiliary: No hepatic masses identified. Multiple tiny gallstones are again seen, however  there is no evidence of cholecystitis or biliary ductal dilatation. Pancreas:  No mass or inflammatory changes. Spleen: Within normal limits in size and appearance. Adrenals/Urinary Tract: Stable small bilateral adrenal masses, consistent with benign adenomas (no followup imaging is recommended). Moderate bilateral renal parenchymal scarring is again noted. No suspicious renal masses identified. No evidence of ureteral calculi or hydronephrosis. Stomach/Bowel: Severe diffuse colonic dilatation with air-fluid levels is again seen to the level of the rectum, without significant change compared to both prior studies dating back to 2021. New moderate dilatation of small bowel is seen, with transition point to nondilated distal small bowel loops in the right lower abdomen, suspicious for adhesion. No evidence of inflammatory process or abnormal fluid collections. Small hiatal hernia again noted. Vascular/Lymphatic: No pathologically enlarged lymph nodes. No acute vascular findings. Aortic atherosclerotic calcification incidentally noted. Reproductive:  No mass or other significant abnormality. Other:  None. Musculoskeletal: No suspicious bone lesions identified. Left hip prosthesis again noted. IMPRESSION: New small-bowel obstruction, with transition point in right lower abdomen suspicious for adhesion. No significant change in severe diffuse colonic dilatation, consistent with chronic colonic ileus or pseudo-obstruction. Cholelithiasis. No radiographic evidence of cholecystitis. Stable small hiatal hernia. Aortic Atherosclerosis (ICD10-I70.0). Electronically Signed   By: JMyles RosenthalD.  On: 05/28/2022 14:56       Impression / Plan:   Acute on chronic abdominal pain/sbo/ogilvies- no defininte small bowel dilation today. Large bowel dilation stable. His chronic narcotics and bzds likely contribute- recommend limiting these- conitnue present and mobilize patient as tolerated- will continue conservative mgmt for  now, daily KUBs- further recommendations to follow.  Thank you very much for this consult. These services were provided by Stephens November, NP-C, in collaboration with Lesly Rubenstein MD, with whom I have discussed this patient in full.   Stephens November, NP-C

## 2022-05-29 NOTE — Hospital Course (Addendum)
  Calvin [name] is a 71 y.o. male with medical history significant of Ogilvie's syndrome, ileus, hypertension, hyperlipidemia, COPD, stroke with mild left-sided weakness, GERD, gout, depression with anxiety, seizure, CAD, MI,  alcohol abuse, CKD stage IIIa, dCHF, colon cancer, thyroid cancer, who presents from his facility with Nausea, vomiting, abdominal distention, abdominal pain for 2 days. Found to have new small bowel obstruction on CT abdomen with transition point in the right lower abdomen suspicious for adhesion. General surgery was consulted and NG tube was placed.  Patient also developed left-sided tingling and numbness and facial droop while in ED. CT head and MRI brain was negative for any acute intracranial abnormality and do reflect a chronic changes. 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  MRI of the cervical spine showed C3-C4 moderate to severe spinal stenosis, seen by neurosurgery, recommended outpatient follow-up.  9/25: Continued to have significant colonic distention.  NG suction continued. The patient was transferred to the stepdown unit for neostigmine dosing on 05/31/2022 and 06/01/2022.   Patient had a colonoscopy decompression on 9/29 and 10/3, followed with rectal tube. Tube feeding started on 10/30-10/3.  10/4.  Modified barium swallow showed a significant risk of aspiration.  TPN started after PICC line placed.  10/10.  Patient currently on TPN, GI cannot place PEG tube due to worsening due to abdominal distention.  Patient will need a total colectomy in order to survive.  Dr. Roosevelt Locks spoke with Dr. Doyne Keel from [facility] and he did not believe that he was a good surgical candidate and can touch back with them in a week if not improved.  10/12.  Patient considering major surgical intervention requiring colectomy and ileostomy and feeding tube.    10/13. Dr Dahlia Byes took to the OR for robotic assisted laparoscopic transverse loop colostomy creation.

## 2022-05-29 NOTE — Progress Notes (Signed)
PT Cancellation Note  Patient Details Name: Calvin [name] MRN: [id number] DOB: 11-02-1950   Cancelled Treatment:    Reason Eval/Treat Not Completed: Other (comment): Pt somewhat agitated and difficult to redirect during PT evaluation attempt this AM.  Spoke to nursing who requested to attempt PT evaluation later this date.     Linus Salmons PT, DPT 05/29/22, 10:32 AM

## 2022-05-29 NOTE — Progress Notes (Signed)
Neurology progress note  S: Patient asleep but arousable, oriented to name and hospital, refused to continue answering questions and said "let me sleep." No acute events since yesterday.  Interval imaging results  MRI brain wo contrast 1. No acute intracranial abnormality or significant interval change. 2. Stable atrophy and white matter disease. This likely reflects the sequela of chronic microvascular ischemia. 3. Remote infarcts of the right occipital lobe and anterior right frontal lobe are stable. 4. Chronic occlusion of the left vertebral artery.  MRI c spine wo contrast 1. Multilevel cervical spondylosis, most pronounced at the C3-4 level where there is moderate-to-severe canal stenosis and severe bilateral foraminal stenosis. Findings slightly progressed from prior MRI. 2. Moderate canal stenosis with severe left foraminal stenosis at C2-C3. 3. Chronically occluded left vertebral artery.  CNS imaging personally reviewed  O:  Vitals:   05/29/22 0819 05/29/22 1647  BP: (!) 144/87 (!) 167/91  Pulse: 93 86  Resp: 20 18  Temp: 99.7 F (37.6 C) 97.7 F (36.5 C)  SpO2: 97% 100%   Physical Exam  Constitutional: Appears chronically and acutely ill  Cardiovascular: Perfusing extremities well Respiratory: Effort normal, non-labored breathing    Neuro: Mental Status: Patient is asleep but arousable, oriented to self and hospital but refuses to answer further orientation questions No signs of aphasia or neglect Cranial Nerves: II: Visual Fields are full. Pupils are equal, round, and reactive to light.   III,IV, VI: EOMI without ptosis or diploplia. Saccadic pursuits V: Facial sensation is symmetric to light touch VII: Facial movement is symmetric.  VIII: hearing is intact to voice X: Uvula elevates symmetrically XI: Shoulder shrug is symmetric. XII: tongue is midline without atrophy or fasciculations.  Motor: No pronator drift in bilateral upper extremities.  Wiggles bilateral toes.  Sensory: Diffuse pain everywhere, but reports sensation is symmetric throughout other than reduced in the left upper extremity (chronic)  Cerebellar: FNF intact bilaterally Gait:  Wheelchair at baseline with PT assist for ambulation; deferred in the acute setting  I have reviewed the images obtained:   Head CT personally reviewed, agree with radiology 1. Degenerative cervical spondylosis with multilevel disc disease and facet disease. 2. Mild left foraminal stenosis at C2-3. 3. Mild multifactorial spinal stenosis at C3-4. There is also moderate right and mild to moderate left foraminal stenosis. 4. Mild right foraminal stenosis at C4-5. 5. Mild bilateral foraminal stenosis at C5-6. 6. Shallow left paracentral disc protrusion at C7-T1 but no significant neural compression.   CT ab/pelvis New small-bowel obstruction, with transition point in right lower abdomen suspicious for adhesion. No significant change in severe diffuse colonic dilatation, consistent with chronic colonic ileus or pseudo-obstruction. Cholelithiasis. No radiographic evidence of cholecystitis. Stable small hiatal hernia. Aortic Atherosclerosis (ICD10-I70.0).  A/P: Focal seizure in the setting of SBO and inability to keep down PO meds. His chronic encephalomalacia on the right is likely to be a seizure focus. TIA felt to be unlikely bc his weakness was painful, cramping, and tonic stiffening. No need for any further TIA workup beyond imaging already completed.  # Concern for breakthrough focal seizures - At home on xanax 0.75 in the morning and 0.5 in the evening             - substitute with 1 mg ativan QHS to avoid withdrawal seizures,  - may consider 1 mg BID if not too sedated - Home dose of Keppra is 500 mg BID, will increase to Keppra 1000 mg BID per max of CrCl 60 (  appreciate pharmacy assistance with calculation) - Depakote 250 mg q6hr to substitute gabapentin 800 mg TID (plus  additional 100 mg in the morning) as gabapentin does have antiseizure effects but is not available IV - If patient remains on depakote free and total levels should be checked in one week.  # Mod-to-severe cervical canal stenosis # Severe bilateral cervical neuroforaminal stenosis - Recommend primary team request neurosurgery consult or curbside to review images, comment if intervention indicated and how acutely, and to arrange outpatient f/u  Neurology will not continue to actively follow. When patient is able to take po again, please contact neurology to assist in transitioning him back to po AED regimen. Please also re-engage if any new neurologic concerns arise.  Su Monks, MD [facility] [phone]  If 7pm- 7am, please page neurology on call as listed in [place].

## 2022-05-29 NOTE — Evaluation (Signed)
Occupational Therapy Evaluation Patient Details Name: Calvin [name] MRN: [id number] DOB: May 24, 1951 Today's Date: 05/29/2022   History of Present Illness Pt is 80 YOM admitted for small bowel obstruction and abdominal distention. PMH includes: CVA w L sided weakness, dCHF, seizures, CAD, HLD, CKD3a, COPD, tobacco abuse, depression, anxiety, and ogilive's syndrome.   Clinical Impression   Patient presenting with decreased independence in self-care, functional mobility, cognition, safety, and endurance. Pt supine in bed, brother present in room and assisting in providing PLOF (Pt poor historian). Brother reports pt requires assistance for ADL tasks, but he is able to use RW to ambulate and WC to roll himself around. Pt reports he is able to feed himself, but needs assistance to open food containers due to weakness in LUE. Pt currently functioning at mod-max A for bed mobility (rolling). Pt declined  transferring EOB and mobility tasks. Patient will benefit from acute OT to increase overall independence in the areas of ADLs, functional mobility, in order to safely discharge to next venue of care.       Recommendations for follow up therapy are one component of a multi-disciplinary discharge planning process, led by the attending physician.  Recommendations may be updated based on patient status, additional functional criteria and insurance authorization.   Follow Up Recommendations  Skilled nursing-short term rehab (<3 hours/day)    Assistance Recommended at Discharge Intermittent Supervision/Assistance  [name];[name];[name];Assist for transportation;Direct supervision/assist for medications management;Assistance with cooking/housework    Functional Status Assessment  Patient has had a recent decline in their functional status and/or demonstrates  limited ability to make significant improvements in function in a reasonable and predictable amount of time  Equipment Recommendations  Other (comment) (Defer to next venue of care.)    Recommendations for Other Services       Precautions / Restrictions Precautions Precautions: Other (comment);Fall Precaution Comments: Seizure precautions Restrictions Weight Bearing Restrictions: No      Mobility Bed Mobility Overal bed mobility: Needs Assistance Bed Mobility: Rolling Rolling: Mod assist, Max assist              Transfers Overall transfer level: Needs assistance                 General transfer comment: Patient declined      Balance       Sitting balance - Comments: Pt declined transfer to EOB.                                   ADL either performed or assessed with clinical judgement   ADL Overall ADL's : Needs assistance/impaired                                       General ADL Comments: Pt declined      Pertinent Vitals/Pain Pain Assessment Pain Assessment: Faces Faces Pain Scale: Hurts little more     Hand Dominance Right   Extremity/Trunk Assessment Upper Extremity Assessment Upper Extremity Assessment: Generalized weakness   Lower Extremity Assessment Lower Extremity Assessment: Generalized weakness;LLE deficits/detail LLE Deficits / Details: L sided weakness due to prior stroke       Communication Communication Communication: No difficulties   Cognition Arousal/Alertness: Awake/alert Behavior During  Therapy: Flat affect Overall Cognitive Status: History of cognitive impairments - at baseline                                 General Comments: Poor historian                Home Living Family/patient expects to be discharged to:: [place]: Wheelchair - Publishing copy (2 wheels)   Additional  Comments: Pt reports he lives at [facility]      Prior Functioning/Environment Prior Level of Function : Needs assist                        OT Problem List: Decreased strength;Decreased cognition;Pain;Impaired balance (sitting and/or standing);Decreased safety awareness;Decreased activity tolerance;Decreased knowledge of use of DME or AE      OT Treatment/Interventions: Self-care/ADL training;Therapeutic exercise;Patient/family education;Energy conservation;Therapeutic activities;DME and/or AE instruction;Cognitive remediation/compensation    OT Goals(Current goals can be found in the care plan section) Acute Rehab OT Goals Patient Stated Goal: to return home. OT Goal Formulation: With patient/family Time For Goal Achievement: 06/12/22 Potential to Achieve Goals: Fair ADL Goals Pt Will Perform Grooming: with min assist Pt Will Perform Lower Body Bathing: with min assist Pt Will Perform Lower Body Dressing: with min assist Pt Will Transfer to Toilet: with min assist Pt Will Perform Toileting - Clothing Manipulation and hygiene: with min assist  OT Frequency: Min 2X/week       AM-PAC OT "6 Clicks" Daily Activity     Outcome Measure Help from another person eating meals?: None Help from another person taking care of personal grooming?: A Little Help from another person toileting, which includes using toliet, bedpan, or urinal?: A Little Help from another person bathing (including washing, rinsing, drying)?: A Little Help from another person to put on and taking off regular upper body clothing?: None Help from another person to put on and taking off regular lower body clothing?: A Little 6 Click Score: 20   End of Session Nurse Communication: Mobility status  Activity Tolerance: Treatment limited secondary to agitation Patient left: in bed;with call bell/phone within reach;with bed alarm set;with family/visitor present  OT Visit Diagnosis: Muscle weakness (generalized)  (M62.81);Unsteadiness on feet (R26.81)                Time: 1740-8144 OT Time Calculation (min): 23 min Charges:  OT General Charges $OT Visit: 1 Visit OT Evaluation $OT Eval Moderate Complexity: [place], OTS 05/29/2022, 2:14 PM

## 2022-05-29 NOTE — TOC Initial Note (Signed)
Transition of Care [facility]) - Initial/Assessment Note    Patient Details  Name: DMANI MIZER MRN: [id number] Date of Birth: July 15, 1951  [facility]) CM/SW Contact:    Laurena Slimmer, RN Phone Number: 05/29/2022, 4:03 PM  Clinical Narrative:                 PT/ OT eval pending LOC rec. Per therapy note PT attempted but unsuccessful. Will reassess after therapy is complete.         Patient Goals and CMS Choice        Expected Discharge Plan and Services                                                Prior Living Arrangements/Services                       Activities of Daily Living Home Assistive Devices/Equipment: Gilford Rile (specify type) ADL Screening (condition at time of admission) Patient's cognitive ability adequate to safely complete daily activities?: Yes Is the patient deaf or have difficulty hearing?: Yes Does the patient have difficulty seeing, even when wearing glasses/contacts?: No Does the patient have difficulty concentrating, remembering, or making decisions?: Yes Patient able to express need for assistance with ADLs?: Yes Does the patient have difficulty dressing or bathing?: Yes Independently performs ADLs?: No Communication: Independent Dressing (OT): Needs assistance Is this a change from baseline?: Pre-admission baseline Grooming: Needs assistance Is this a change from baseline?: Pre-admission baseline Feeding: Needs assistance Is this a change from baseline?: Pre-admission baseline Bathing: Needs assistance Is this a change from baseline?: Pre-admission baseline Toileting: Needs assistance Is this a change from baseline?: Pre-admission baseline In/Out Bed: Needs assistance Is this a change from baseline?: Pre-admission baseline Walks in Home: Needs assistance Is this a change from baseline?: Pre-admission baseline Does the patient have difficulty walking or climbing stairs?: Yes Weakness of Legs: Both Weakness of  Arms/Hands: Both  Permission Sought/Granted                  Emotional Assessment              Admission diagnosis:  Small bowel obstruction ([place]) [K56.609] Stroke ([place]) [I63.9] Stroke-like symptoms [R29.90] Patient Active Problem List   Diagnosis Date Noted   SBO (small bowel obstruction) ([place]) 05/28/2022   Chronic diastolic CHF (congestive heart failure) ([place]) 05/28/2022   Coffee ground emesis 05/28/2022   Small bowel obstruction ([facility])    Abdominal distention    HLD (hyperlipidemia) 10/16/2021   Stroke ([place]) 10/16/2021   Nausea vomiting and diarrhea 10/16/2021   Sepsis ([place]) 10/16/2021   Chronic kidney disease, stage 3a ([place]) 10/16/2021   Tobacco abuse 10/16/2021   Muscle twitching 08/14/2019   UTI (urinary tract infection) 08/03/2019   Ileus ([place]) 08/03/2019   Hypokalemia 08/03/2019   QT prolongation 08/03/2019   Ogilvie's syndrome    Abdominal pain 04/03/2019   Left-sided weakness 09/30/2017   Seizures ([place]) 09/30/2017   HTN (hypertension) 09/30/2017   CAD (coronary artery disease) 09/30/2017   COPD (chronic obstructive pulmonary disease) ([place]) 09/30/2017   Depression with anxiety 09/30/2017   PCP:  Pcp, No Pharmacy:   [place] [place] [facility] [place] [address] [place] [address] Phone: [phone] Fax: [phone]     Social Determinants  of Health (SDOH) Interventions    Readmission Risk Interventions    10/17/2021   10:47 AM  Readmission Risk Prevention Plan  Transportation Screening Complete  PCP or Specialist Appt within 3-5 Days Complete  Social Work Consult for [place] Planning/Counseling Complete  Palliative Care Screening Not Applicable  Medication Review [employer]) Complete

## 2022-05-30 ENCOUNTER — Inpatient Hospital Stay: Payer: Medicare Other

## 2022-05-30 ENCOUNTER — Encounter: Payer: Self-pay | Admitting: Internal Medicine

## 2022-05-30 DIAGNOSIS — K56609 Unspecified intestinal obstruction, unspecified as to partial versus complete obstruction: Secondary | ICD-10-CM | POA: Diagnosis not present

## 2022-05-30 LAB — CBC
HCT: 39 % (ref 39.0–52.0)
Hemoglobin: 12.3 g/dL — ABNORMAL LOW (ref 13.0–17.0)
MCH: 29.9 pg (ref 26.0–34.0)
MCHC: 31.5 g/dL (ref 30.0–36.0)
MCV: 94.7 fL (ref 80.0–100.0)
Platelets: 170 10*3/uL (ref 150–400)
RBC: 4.12 MIL/uL — ABNORMAL LOW (ref 4.22–5.81)
RDW: 12 % (ref 11.5–15.5)
WBC: 8 10*3/uL (ref 4.0–10.5)
nRBC: 0 % (ref 0.0–0.2)

## 2022-05-30 LAB — BASIC METABOLIC PANEL
Anion gap: 6 (ref 5–15)
BUN: 18 mg/dL (ref 8–23)
CO2: 24 mmol/L (ref 22–32)
Calcium: 8.4 mg/dL — ABNORMAL LOW (ref 8.9–10.3)
Chloride: 111 mmol/L (ref 98–111)
Creatinine, Ser: 1.41 mg/dL — ABNORMAL HIGH (ref 0.61–1.24)
GFR, Estimated: 53 mL/min — ABNORMAL LOW (ref >60)
Glucose, Bld: 80 mg/dL (ref 70–99)
Potassium: 3.7 mmol/L (ref 3.5–5.1)
Sodium: 141 mmol/L (ref 135–145)

## 2022-05-30 LAB — TROPONIN I (HIGH SENSITIVITY)
Troponin I (High Sensitivity): 20 ng/L — ABNORMAL HIGH (ref <18)
Troponin I (High Sensitivity): 23 ng/L — ABNORMAL HIGH (ref <18)

## 2022-05-30 LAB — VITAMIN B12: Vitamin B-12: 993 pg/mL — ABNORMAL HIGH (ref 180–914)

## 2022-05-30 LAB — MAGNESIUM: Magnesium: 2.2 mg/dL (ref 1.7–2.4)

## 2022-05-30 MED ORDER — METHYLNALTREXONE BROMIDE 12 MG/0.6ML ~~LOC~~ SOLN
6.0000 mg | Freq: Once | SUBCUTANEOUS | Status: AC
  Administered 2022-05-30: 6 mg via SUBCUTANEOUS
  Filled 2022-05-30: qty 0.6

## 2022-05-30 MED ORDER — NITROGLYCERIN 0.4 MG SL SUBL
0.4000 mg | SUBLINGUAL_TABLET | SUBLINGUAL | Status: DC | PRN
  Administered 2022-05-30: 0.4 mg via SUBLINGUAL
  Filled 2022-05-30: qty 1

## 2022-05-30 NOTE — Assessment & Plan Note (Deleted)
Last hemoglobin 11.6.

## 2022-05-30 NOTE — Plan of Care (Signed)
  Problem: Activity: Goal: Risk for activity intolerance will decrease Outcome: Progressing   Problem: Safety: Goal: Ability to remain free from injury will improve Outcome: Progressing   

## 2022-05-30 NOTE — TOC Progression Note (Signed)
Transition of Care [facility]) - Progression Note    Patient Details  Name: Calvin [name] MRN: [id number] Date of Birth: 07-03-1951  [facility]) CM/SW Contact  Laurena Slimmer, RN Phone Number: 05/30/2022, 4:38 PM  Clinical Narrative:    Attempt to reach patient's daughter regarding discharge plan. No answer. Will reattempt        Expected Discharge Plan and Services                                                 Social Determinants of Health (SDOH) Interventions    Readmission Risk Interventions    10/17/2021   10:47 AM  Readmission Risk Prevention Plan  Transportation Screening Complete  PCP or Specialist Appt within 3-5 Days Complete  Social Work Consult for [place] Planning/Counseling Complete  Palliative Care Screening Not Applicable  Medication Review Press photographer) Complete

## 2022-05-30 NOTE — Progress Notes (Signed)
Pt has been refusing to allow nursing staff to place padded side rails onto bed for seizure precautions per previous RN. Pt re-educated on purpose of seizure precautions and padded side rails. Pt continues to adamantly refuse placement of padded side rails onto bed.

## 2022-05-30 NOTE — Progress Notes (Signed)
       CROSS COVER NOTE  NAME: Calvin [name] MRN: [id number] DOB : [birth year]    Date of Service   05/30/2022   HPI/Events of Note   Notified of patient report of chest pain.  On bedside assessment Mr Fier endorses generalized body pain. He specifically endorses chest pain, ankle pain, leg pain, and abdominal pain. His chest pain is midsternal and he rates the pain as a 10. He reports the pain is improved after Nitro x1 and Ativan given by nursing. He denies dyspnea, palpitations, dizziness, nausea, vomiting, or radiation of pain.    Interventions   Assessment/Plan:  Continue PRN morphine SL Nitro EKG--> Sinus tachycardia with 1st Degree AVB HR 103 Troponin-- 20-->23       This document was prepared using Dragon voice recognition software and may include unintentional dictation errors.  Neomia Glass DNP, MHA, FNP-BC Nurse Practitioner [facility] Pager [phone]

## 2022-05-30 NOTE — Plan of Care (Signed)

## 2022-05-30 NOTE — Progress Notes (Signed)
Pt yelling out, c/o L chest pain. Pt states "it feels like my chest is going to cave in" when asked to describe the pain. Pt unable to rate pain when asked by this RN. Pt then begins to yell "it's in my ankle now, now it is in my head, it's in my fingertips." Hospitalist informed of pt's complaints of chest pain. EKG, labs, and Nitroglycerin ordered.

## 2022-05-30 NOTE — Progress Notes (Signed)
Progress Note   Patient: Calvin [name] AST:[id number] DOB: 09/24/1950 DOA: 05/28/2022     2 DOS: the patient was seen and examined on 05/30/2022   Brief hospital course: Taken from H&P.  JERRICO COVELLO is a 71 y.o. male with medical history significant of Ogilvie's syndrome, ileus, hypertension, hyperlipidemia, COPD, stroke with mild left-sided weakness, GERD, gout, depression with anxiety, seizure, CAD, MI,  alcohol abuse, CKD stage IIIa, dCHF, colon cancer, thyroid cancer, who presents from his facility with Nausea, vomiting, abdominal distention, abdominal pain for 2 days. Found to have new small bowel obstruction on CT abdomen with transition point in the right lower abdomen suspicious for adhesion. General surgery was consulted and NG tube was placed.  Patient also developed left-sided tingling and numbness and facial droop while in ED, has an history of left-sided stroke with residual weakness.  Symptoms resolved within few minutes. CT head and MRI brain was negative for any acute intracranial abnormality and do reflect a chronic changes. 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 Neurology was consulted-patient to complete stroke work-up.  9/25: Labs with mildly elevated creatinine at 1.26 with baseline seems to be around 1.1-1.2.  Lipid panel normal with LDL of 57 CBC with mild leukocytosis at 11.1.  Hemoglobin stable,A1c 5.8 Repeat abdominal x-ray with stable obstructive findings and no other acute abnormality.  Continued to have significant colonic distention.  Passing flatus, minimum secretions with NG tube. general surgery would like to continue with conservative management with NG tube. Speech and cognition appears to be at baseline per speech evaluation. Patient uses quite a bit of opioids for chronic pain which can be contributory.  Try avoiding opioids if possible. We can try a Toradol as needed for pain with a close monitoring of renal function.  9/26: Patient  overnight complained about chest pain, on evaluation by night cross coverage, he was complaining more of generalized aches and pain but did responded to nitroglycerin x1.  EKG with sinus tachycardia, troponin 20>> 23. Patient was given a dose of morphine.  Renal function with slight worsening of creatinine to 1.41 today.  We will avoid Toradol and pain will be managed with morphine although can contribute to his slower bowel function.  Repeat abdominal x-ray with air-filled distention of large and small bowel loops favoring ileus. Surgery would like to continue with conservative management. Poor surgical candidate due to poor baseline functional status and comorbidities.   Assessment and Plan: * SBO (small bowel obstruction) (HCC) CT showed new small-bowel obstruction, with transition point in right lower abdomen, suspicious for adhesion. Pt has severe abdominal distention.  Consulted Dr. Dahlia Byes of surgery, advising conservative management with NG tube.  Repeat x-ray with no significant change.  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 -Continue with NG tube suctioning. -Avoid opioids if possible as there can be contributory -Supportive care  Stroke [facility]) Pt has hx of stroke with left sided weakness. Today, pt developed new strokelike symptoms, including left arm numbness, left facial droop, slurred speech.  CT head and MRI negative for any acute abnormality. CTA with chronic vertebral occlusion. Speech and swallow evaluation with no specific recommendations as patient is at baseline. Neurology was consulted. -Continue to monitor  Chronic diastolic CHF (congestive heart failure) ([place]) 2D echo on 08/09/2019 showed EF of 55 to 60% with grade 1 diastolic dysfunction.  Patient does not have leg edema or JVD.  CHF seem to be compensated.  BNP within normal limit at 74 -Hold Lasix -  Continue to monitor  Seizures (HCC) Seizure -Seizure precaution -When necessary  Ativan for seizure -Home p.o. Keppra is being switched with IV 1000 mg twice daily. -Continue to monitor    HTN (hypertension) -hold oral blood pressure medications -IV hydralazine as needed  CAD (coronary artery disease) No chest pain.  Troponin negative x2 -Hold aspirin and Zocor due to NG tube placement  HLD (hyperlipidemia) - Hold Zocor  Chronic kidney disease, stage 3a ([place]) Renal function stable.  GFR> 60. -Follow-up by BMP  COPD (chronic obstructive pulmonary disease) (HCC) Stable -As needed albuterol and bronchodilators  Coffee ground emesis Coffee-ground emesis was reported, but hemoglobin stable. Hemoglobin 12.3. -IV Protonix 40 mg twice daily -Follow-up CBC every 8 hours -PT/OT and type screen  Tobacco abuse -nicotine patch  Depression with anxiety Hold home oral Xanax and Zoloft -As needed IV Ativan for anxiety   Subjective: Patient continued to have significant abdominal pain.  He was also complaining of generalized aches and pains.  NG tube remain in place with yellowish secretions.  Physical Exam: Vitals:   05/30/22 0400 05/30/22 0733 05/30/22 1131 05/30/22 1444  BP: (!) 152/63 (!) 134/99 (!) 161/91 (!) 167/89  Pulse: 88 92 86 89  Resp: '18 17 18 16  '$ Temp: 97.9 F (36.6 C) 99.4 F (37.4 C) 97.9 F (36.6 C) 98.3 F (36.8 C)  TempSrc: Oral     SpO2: 96% 98% 99% 98%  Weight:      Height:       General.  Chronically ill-appearing elderly man, in no acute distress. Pulmonary.  Lungs clear bilaterally, normal respiratory effort. CV.  Regular rate and rhythm, no JVD, rub or murmur. Abdomen.  Soft, nontender, distended, BS hypoactive. CNS.  Alert and oriented .  No focal neurologic deficit. Extremities.  No edema, no cyanosis, pulses intact and symmetrical. Psychiatry.  Judgment and insight appears impaired.  Data Reviewed: Prior data reviewed  Family Communication: Called daughter with no response.  Disposition: Status is:  Inpatient Remains inpatient appropriate because: Severity of illness   Planned Discharge Destination: Skilled nursing facility  Time spent: 45 minutes  This record has been created using Systems analyst. Errors have been sought and corrected,but may not always be located. Such creation errors do not reflect on the standard of care.  Author: Lorella Nimrod, MD 05/30/2022 4:27 PM  For on call review [email or web address].

## 2022-05-30 NOTE — Progress Notes (Signed)
Morphine given as ordered per Dr. Reesa Chew for severe generalized pain.

## 2022-05-30 NOTE — Progress Notes (Signed)
GI Inpatient Follow-up Note  Subjective:  Patient seen and not very oriented.  Scheduled Inpatient Medications:   LORazepam  1 mg Intravenous QHS   methylnaltrexone  6 mg Subcutaneous Once   nicotine  21 mg Transdermal Daily   pantoprazole (PROTONIX) IV  40 mg Intravenous Q12H    Continuous Inpatient Infusions:    sodium chloride 100 mL/hr at 05/30/22 1654   levETIRAcetam Stopped (05/30/22 0850)   valproate sodium Stopped (05/30/22 1211)    PRN Inpatient Medications:  acetaminophen **OR** acetaminophen (TYLENOL) oral liquid 160 mg/5 mL **OR** acetaminophen, albuterol, hydrALAZINE, LORazepam, LORazepam, morphine injection, nitroGLYCERIN, ondansetron (ZOFRAN) IV, phenol, senna-docusate, trolamine salicylate  Review of Systems:  Review of Systems  Reason unable to perform ROS: Altered mental status.      Physical Examination: BP (!) 152/95   Pulse 88   Temp 98.3 F (36.8 C)   Resp 16   Ht '5\' 11"'$  (1.803 m)   Wt 95.1 kg   SpO2 98%   BMI 29.24 kg/m  Gen: NAD HEENT: PEERLA, EOMI, Neck: supple, no JVD or thyromegaly Chest: No respiratory distress Abd: soft but distended Ext: no edema, well perfused with 2+ pulses, Skin: no rash or lesions noted Lymph: no LAD  Data: Lab Results  Component Value Date   WBC 8.0 05/30/2022   HGB 12.3 (L) 05/30/2022   HCT 39.0 05/30/2022   MCV 94.7 05/30/2022   PLT 170 05/30/2022   Recent Labs  Lab 05/29/22 0742 05/29/22 0824 05/30/22 0259  HGB 14.0 13.9 12.3*   Lab Results  Component Value Date   NA 141 05/30/2022   K 3.7 05/30/2022   CL 111 05/30/2022   CO2 24 05/30/2022   BUN 18 05/30/2022   CREATININE 1.41 (H) 05/30/2022   Lab Results  Component Value Date   ALT 13 05/29/2022   AST 18 05/29/2022   ALKPHOS 61 05/29/2022   BILITOT 0.8 05/29/2022   Recent Labs  Lab 05/28/22 1258 05/29/22 0742  APTT 31  --   INR  --  1.0   Assessment/Plan: Mr. Caruth is a 71 y.o. gentleman with history of chronic ogilvie's  syndrome, COPD, hypertension, hyperlipidemia, and multiple other medical comorbidities here with SBO and we have been consulted for chronic ogilvie's syndrome.   Recommendations:  - will trial IV methylnaltrexone given history of opioid use - IV hydration and replete electrolytes aggressively - avoid narcotics at ALL cost - B12 is slightly high so will consider empiric treatment for SIBO but will see how IV methylnaltrexone works  Will continue to follow, please call with any questions or concerns.  Raylene Miyamoto MD, MPH [place]

## 2022-05-30 NOTE — Progress Notes (Signed)
PT Cancellation Note  Patient Details Name: Calvin [name] MRN: [id number] DOB: 24-Feb-1951   Cancelled Treatment:    Reason Eval/Treat Not Completed: Other (comment).  Chart reviewed and attempted to see pt.  Upon arrival to the room nursing in room giving pt pain meds.  Pt screaming in pain and not able to be recomposed.  Pt noted the pain to be everywhere and hurting with any movement at all.  Pt ultimately declined therapy at this time.  Will re-attempt at later date/time as medically appropriate.   Gwenlyn Saran, PT, DPT 05/30/22, 5:00 PM

## 2022-05-30 NOTE — Progress Notes (Signed)
Noted that patient has pulled NGT partially out. MD made aware. NGT clamped and re-advanced to 59cm. Abd XR ordered by hospitalist, may place NGT back to suction if confirmation of NGT placement completed by XR.

## 2022-05-30 NOTE — Progress Notes (Signed)
[facility] SURGICAL PROGRESS NOTE (cpt [phone])  Hospital Day(s): 2.   Interval History: Patient seen and examined. Overnight, appears he had issues with confusion and chest pain. Worked up by medicine service. This morning, he is somnolent, does not participate much. Remains without leukocytosis; 8.0K. Bump is renal function above baseline; sCr - 1.41; UO - 400 ccs. NGT output 850 ccs. KUB this morning still shows marked colonic dilation, stable. Difficult to assess small bowel. No bowel movements reported.   Review of Systems:  Unable to reliably preform this morning secondary to somnolence, lack of patient participation, no family at bedside to assist  Vital signs in last 24 hours: [min-max] current  Temp:  [97.7 F (36.5 C)-99.7 F (37.6 C)] 97.9 F (36.6 C) (09/26 0400) Pulse Rate:  [86-93] 88 (09/26 0400) Resp:  [18-20] 18 (09/26 0400) BP: (144-167)/(63-91) 152/63 (09/26 0400) SpO2:  [96 %-100 %] 96 % (09/26 0400) Weight:  [95.1 kg] 95.1 kg (09/25 1200)     Height: '5\' 11"'$  (180.3 cm) Weight: 95.1 kg BMI (Calculated): 29.25   Intake/Output last 2 shifts:  09/25 0701 - 09/26 0700 In: 3482.4 [I.V.:2737.5; NG/GT:180; IV Piggyback:564.9] Out: 1250 [Urine:400; Emesis/NG output:850]   Physical Exam:  Constitutional: somnolent, arouses briefly, NAD HENT: normocephalic without obvious abnormality; NGT in place Eyes: PERRL, EOM's grossly intact and symmetric  Respiratory: breathing non-labored at rest; on [place] Cardiovascular: regular rate and sinus rhythm  Gastrointestinal: Soft, remains distended (unchanged), no apparent tenderness, no rebound/guarding. He is certainly not peritonitic    Labs:     Latest Ref Rng & Units 05/30/2022    2:59 AM 05/29/2022    8:24 AM 05/29/2022    7:42 AM  CBC  WBC 4.0 - 10.5 K/uL 8.0  10.4  11.1   Hemoglobin 13.0 - 17.0 g/dL 12.3  13.9  14.0   Hematocrit 39.0 - 52.0 % 39.0  44.0  43.8   Platelets 150 - 400 K/uL 170  195  192        Latest Ref Rng & Units 05/30/2022    2:59 AM 05/29/2022    7:42 AM 05/28/2022   12:08 PM  CMP  Glucose 70 - 99 mg/dL 80  112  122   BUN 8 - 23 mg/dL '18  13  13   '$ Creatinine 0.61 - 1.24 mg/dL 1.41  1.26  1.18   Sodium 135 - 145 mmol/L 141  141  140   Potassium 3.5 - 5.1 mmol/L 3.7  4.3  4.6   Chloride 98 - 111 mmol/L 111  108  107   CO2 22 - 32 mmol/L '24  26  22   '$ Calcium 8.9 - 10.3 mg/dL 8.4  9.1  9.2   Total Protein 6.5 - 8.1 g/dL  7.3  7.8   Total Bilirubin 0.3 - 1.2 mg/dL  0.8  1.0   Alkaline Phos 38 - 126 U/L  61  64   AST 15 - 41 U/L  18  28   ALT 0 - 44 U/L  13  17     Imaging studies:   KUB (05/30/2022) personally reviewed which shows persistent colonic dilation which is unchanged, difficult to assess small bowel but does not seem to be any significant distension, and radiologist report pending...   Assessment/Plan: (ICD-10's: K72.609) 71 y.o. male with possible small bowel obstruction also with stable appearance of chronic colonic distension, complicated by significant comorbid disease.    - I think it is reasonable to continue  NGT decompression for now given higher output; monitor and record output             - No emergent surgical intervention. He is a sub optimal candidate for any surgical intervention given his deconditioning and significant comorbid disease            - Monitor abdominal examination; on-going bowel function - Consider serial KUBs             - Pain control prn; limit narcotics - Antiemetics prn - Mobilize as feasible   - Appreciate GI input - Appreciate neurology input - Further management per primary service  All of the above findings and recommendations were discussed with the medical team. No family at bedside this morning.    -- Edison Simon, PA-C [facility] 05/30/2022, 7:13 AM M-F: 7am - 4pm

## 2022-05-31 ENCOUNTER — Inpatient Hospital Stay (HOSPITAL_COMMUNITY)
Admit: 2022-05-31 | Discharge: 2022-05-31 | Disposition: A | Discharge status: Home / Self Care | Payer: Medicare Other | Attending: Internal Medicine | Admitting: Internal Medicine

## 2022-05-31 ENCOUNTER — Inpatient Hospital Stay: Payer: Medicare Other

## 2022-05-31 ENCOUNTER — Encounter: Payer: Self-pay | Admitting: Internal Medicine

## 2022-05-31 DIAGNOSIS — I5032 Chronic diastolic (congestive) heart failure: Secondary | ICD-10-CM | POA: Diagnosis not present

## 2022-05-31 DIAGNOSIS — R569 Unspecified convulsions: Secondary | ICD-10-CM | POA: Diagnosis not present

## 2022-05-31 DIAGNOSIS — M4802 Spinal stenosis, cervical region: Secondary | ICD-10-CM

## 2022-05-31 DIAGNOSIS — I10 Essential (primary) hypertension: Secondary | ICD-10-CM

## 2022-05-31 DIAGNOSIS — K5981 Ogilvie syndrome: Secondary | ICD-10-CM

## 2022-05-31 DIAGNOSIS — K56609 Unspecified intestinal obstruction, unspecified as to partial versus complete obstruction: Secondary | ICD-10-CM | POA: Diagnosis not present

## 2022-05-31 DIAGNOSIS — I4892 Unspecified atrial flutter: Secondary | ICD-10-CM

## 2022-05-31 DIAGNOSIS — K920 Hematemesis: Secondary | ICD-10-CM

## 2022-05-31 LAB — CBC
HCT: 41.6 % (ref 39.0–52.0)
Hemoglobin: 13.3 g/dL (ref 13.0–17.0)
MCH: 30.2 pg (ref 26.0–34.0)
MCHC: 32 g/dL (ref 30.0–36.0)
MCV: 94.3 fL (ref 80.0–100.0)
Platelets: 169 10*3/uL (ref 150–400)
RBC: 4.41 MIL/uL (ref 4.22–5.81)
RDW: 12 % (ref 11.5–15.5)
WBC: 7.3 10*3/uL (ref 4.0–10.5)
nRBC: 0 % (ref 0.0–0.2)

## 2022-05-31 LAB — MRSA NEXT GEN BY PCR, NASAL: MRSA by PCR Next Gen: NOT DETECTED

## 2022-05-31 LAB — BASIC METABOLIC PANEL
Anion gap: 13 (ref 5–15)
BUN: 18 mg/dL (ref 8–23)
CO2: 21 mmol/L — ABNORMAL LOW (ref 22–32)
Calcium: 8.9 mg/dL (ref 8.9–10.3)
Chloride: 110 mmol/L (ref 98–111)
Creatinine, Ser: 1.22 mg/dL (ref 0.61–1.24)
GFR, Estimated: >60 mL/min (ref >60)
Glucose, Bld: 80 mg/dL (ref 70–99)
Potassium: 3.7 mmol/L (ref 3.5–5.1)
Sodium: 144 mmol/L (ref 135–145)

## 2022-05-31 LAB — MAGNESIUM: Magnesium: 2.1 mg/dL (ref 1.7–2.4)

## 2022-05-31 LAB — GLUCOSE, CAPILLARY: Glucose-Capillary: 99 mg/dL (ref 70–99)

## 2022-05-31 LAB — LEVETIRACETAM LEVEL: Levetiracetam Lvl: 32.4 ug/mL (ref 10.0–40.0)

## 2022-05-31 MED ORDER — IOHEXOL 9 MG/ML PO SOLN
500.0000 mL | ORAL | Status: DC
  Administered 2022-05-31: 500 mL via ORAL

## 2022-05-31 MED ORDER — ATROPINE SULFATE 1 MG/10ML IJ SOSY
PREFILLED_SYRINGE | INTRAMUSCULAR | Status: AC
  Filled 2022-05-31: qty 10

## 2022-05-31 MED ORDER — PERFLUTREN LIPID MICROSPHERE
1.0000 mL | INTRAVENOUS | Status: AC | PRN
  Administered 2022-05-31: 2 mL via INTRAVENOUS

## 2022-05-31 MED ORDER — CHLORHEXIDINE GLUCONATE CLOTH 2 % EX PADS
6.0000 | MEDICATED_PAD | Freq: Every day | CUTANEOUS | Status: DC
  Administered 2022-05-31 – 2022-06-04 (×5): 6 via TOPICAL

## 2022-05-31 MED ORDER — NEOSTIGMINE METHYLSULFATE 10 MG/10ML IV SOLN
1.0000 mg | Freq: Once | INTRAVENOUS | Status: AC
  Administered 2022-05-31: 1 mg via INTRAVENOUS
  Filled 2022-05-31: qty 1

## 2022-05-31 MED ORDER — IPRATROPIUM-ALBUTEROL 0.5-2.5 (3) MG/3ML IN SOLN
3.0000 mL | Freq: Four times a day (QID) | RESPIRATORY_TRACT | Status: DC
  Administered 2022-05-31: 3 mL via RESPIRATORY_TRACT
  Filled 2022-05-31 (×2): qty 3

## 2022-05-31 MED ORDER — METOPROLOL TARTRATE 5 MG/5ML IV SOLN: 5.0000 mg | INTRAVENOUS | Status: DC | PRN

## 2022-05-31 MED ORDER — GLYCOPYRROLATE 0.2 MG/ML IJ SOLN
0.1000 mg | Freq: Once | INTRAMUSCULAR | Status: AC
  Administered 2022-05-31: 0.1 mg via INTRAVENOUS
  Filled 2022-05-31: qty 1

## 2022-05-31 MED ORDER — IOHEXOL 300 MG/ML  SOLN
100.0000 mL | Freq: Once | INTRAMUSCULAR | Status: AC | PRN
  Administered 2022-05-31: 100 mL via INTRAVENOUS

## 2022-05-31 MED ORDER — LIDOCAINE HCL 2 % IJ SOLN
10.0000 mL | Freq: Once | INTRAMUSCULAR | Status: DC
  Filled 2022-05-31: qty 10

## 2022-05-31 MED ORDER — BUDESONIDE 0.25 MG/2ML IN SUSP
0.2500 mg | Freq: Two times a day (BID) | RESPIRATORY_TRACT | Status: DC
  Administered 2022-05-31 – 2022-06-01 (×2): 0.25 mg via RESPIRATORY_TRACT
  Filled 2022-05-31 (×3): qty 2

## 2022-05-31 MED ORDER — MORPHINE SULFATE (PF) 2 MG/ML IV SOLN
2.0000 mg | Freq: Once | INTRAVENOUS | Status: AC
  Administered 2022-05-31: 2 mg via INTRAVENOUS
  Filled 2022-05-31: qty 1

## 2022-05-31 MED ORDER — LIDOCAINE HCL (PF) 2 % IJ SOLN
10.0000 mL | Freq: Once | INTRAMUSCULAR | Status: DC
  Filled 2022-05-31 (×2): qty 10

## 2022-05-31 NOTE — Assessment & Plan Note (Addendum)
IV Keppra and Depakote will be switched over to oral.

## 2022-05-31 NOTE — Assessment & Plan Note (Addendum)
Started nebulizers today.

## 2022-05-31 NOTE — Progress Notes (Signed)
Physical Therapy Treatment Patient Details Name: Calvin [name] MRN: [id number] DOB: Dec 24, 1950 Today's Date: 05/31/2022   History of Present Illness Pt is 59 YOM admitted for small bowel obstruction and abdominal distention. PMH includes: CVA w L sided weakness, dCHF, seizures, CAD, HLD, CKD3a, COPD, tobacco abuse, depression, anxiety, and ogilive's syndrome.    PT Comments    Pt presents to PT in bed. Pt is very lethargic and unable to maintain arousal during today's session. BP supine:  160/93 mmHg. Able to participate in LE AROM/AAROM supine therapeutic exercises. Mobility deferred secondary to lethargy. Would benefit from skilled PT at SNF to address above deficits in functional mobility, strength, balance, and activity tolerance to promote optimal return to PLOF.   Recommendations for follow up therapy are one component of a multi-disciplinary discharge planning process, led by the attending physician.  Recommendations may be updated based on patient status, additional functional criteria and insurance authorization.  Follow Up Recommendations  Skilled nursing-short term rehab (<3 hours/day) Can patient physically be transported by private vehicle: No   Assistance Recommended at Discharge Frequent or constant Supervision/Assistance  [name];Assistance with cooking/housework;Direct supervision/assist for financial management;[name];A lot of help with walking and/or transfers;Assist for transportation;Direct supervision/assist for medications management   Equipment Recommendations  None recommended by PT    Recommendations for Other Services       Precautions / Restrictions Precautions Precautions: Other (comment);Fall Precaution Comments: Seizure precautions Restrictions Weight Bearing Restrictions: No     Mobility  Bed Mobility               General bed  mobility comments: unable to attempt functional mobility d/t lethargy    Transfers                        Ambulation/Gait                   Stairs             Wheelchair Mobility    Modified Rankin (Stroke Patients Only)       Balance                                            Cognition Arousal/Alertness: Lethargic   Overall Cognitive Status: History of cognitive impairments - at baseline                                          Exercises Total Joint Exercises Ankle Circles/Pumps: AROM, Both, 15 reps Hip ABduction/ADduction: AAROM, Both, 15 reps Straight Leg Raises: AAROM, Both, 15 reps L UE AROM to tolerance B ankle PROM/static stretching to tolerance    General Comments        Pertinent Vitals/Pain Pain Assessment Pain Assessment: PAINAD Breathing: normal Negative Vocalization: none Facial Expression: smiling or inexpressive Body Language: rigid, fists clenched, knees up, pushing/pulling away, strikes out Consolability: no need to console PAINAD Score: 2    Home Living                          Prior Function  PT Goals (current goals can now be found in the care plan section) Acute Rehab PT Goals PT Goal Formulation: Patient unable to participate in goal setting Progress towards PT goals: Not progressing toward goals - comment (pt lethargic and unable to maintain arousal during tx session)    Frequency    Min 2X/week      PT Plan Current plan remains appropriate    Co-evaluation              AM-PAC PT "6 Clicks" Mobility   Outcome Measure  Help needed turning from your back to your side while in a flat bed without using bedrails?: A Little Help needed moving from lying on your back to sitting on the side of a flat bed without using bedrails?: A Little Help needed moving to and from a bed to a chair (including a wheelchair)?: A Little Help needed  standing up from a chair using your arms (e.g., wheelchair or bedside chair)?: A Little Help needed to walk in hospital room?: A Lot Help needed climbing 3-5 steps with a railing? : A Lot 6 Click Score: 16    End of Session   Activity Tolerance: Patient limited by lethargy Patient left: in bed;with call bell/phone within reach;with bed alarm set Nurse Communication: Mobility status, pt's incr lethargy PT Visit Diagnosis: Muscle weakness (generalized) (M62.81);Difficulty in walking, not elsewhere classified (R26.2)     Time: 5638-9373 PT Time Calculation (min) (ACUTE ONLY): 13 min  Charges:                        Glenice Laine MPH, SPT 05/31/22, 3:33 PM

## 2022-05-31 NOTE — Progress Notes (Signed)
Call placed to daughter [name] about pts transfer to CCU.She did not answer. Left HIPAA compliant message.

## 2022-05-31 NOTE — Progress Notes (Signed)
Progress Note   Patient: Calvin [name] YTK:[id number] DOB: Nov 18, 1950 DOA: 05/28/2022     3 DOS: the patient was seen and examined on 05/31/2022   Brief hospital course: Taken from H&P.  GABOR LUSK is a 71 y.o. male with medical history significant of Ogilvie's syndrome, ileus, hypertension, hyperlipidemia, COPD, stroke with mild left-sided weakness, GERD, gout, depression with anxiety, seizure, CAD, MI,  alcohol abuse, CKD stage IIIa, dCHF, colon cancer, thyroid cancer, who presents from his facility with Nausea, vomiting, abdominal distention, abdominal pain for 2 days. Found to have new small bowel obstruction on CT abdomen with transition point in the right lower abdomen suspicious for adhesion. General surgery was consulted and NG tube was placed.  Patient also developed left-sided tingling and numbness and facial droop while in ED, has an history of left-sided stroke with residual weakness.  Symptoms resolved within few minutes. CT head and MRI brain was negative for any acute intracranial abnormality and do reflect a chronic changes. 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 Neurology was consulted-patient to complete stroke work-up.  9/25: Labs with mildly elevated creatinine at 1.26 with baseline seems to be around 1.1-1.2.  Lipid panel normal with LDL of 57 CBC with mild leukocytosis at 11.1.  Hemoglobin stable,A1c 5.8 Repeat abdominal x-ray with stable obstructive findings and no other acute abnormality.  Continued to have significant colonic distention.  Passing flatus, minimum secretions with NG tube. general surgery would like to continue with conservative management with NG tube. Speech and cognition appears to be at baseline per speech evaluation. Patient uses quite a bit of opioids for chronic pain which can be contributory.  Try avoiding opioids if possible. We can try a Toradol as needed for pain with a close monitoring of renal function.  9/26: Patient  overnight complained about chest pain, on evaluation by night cross coverage, he was complaining more of generalized aches and pain but did responded to nitroglycerin x1.  EKG with sinus tachycardia, troponin 20>> 23. Patient was given a dose of morphine.  Renal function with slight worsening of creatinine to 1.41 today.  We will avoid Toradol and pain will be managed with morphine although can contribute to his slower bowel function.  Repeat abdominal x-ray with air-filled distention of large and small bowel loops favoring ileus. Surgery would like to continue with conservative management. Poor surgical candidate due to poor baseline functional status and comorbidities.   Assessment and Plan: Ogilvie's syndrome General surgery does not think this is bowel obstruction.  Gastroenterology would like to do neostigmine infusion.  Patient will need to be transferred to a heart monitoring unit for this.  Cervical spinal stenosis MRI showing multilevel cervical spondylosis most pronounced at C3-C4 where moderate to severe canal stenosis and severe bilateral foraminal stenosis.  Chronic diastolic CHF (congestive heart failure) (HCC) 2D echo on 08/09/2019 showed EF of 55 to 60% with grade 1 diastolic dysfunction.   -Hold Lasix -Watch closely with IV fluids  Seizures (HCC) Seizure -Seizure precaution -When necessary Ativan for seizure -IV Keppra and Depakote.    HTN (hypertension) -hold oral blood pressure medications -IV hydralazine as needed  CAD (coronary artery disease) No chest pain.  Troponin negative x2 -Hold aspirin and Zocor due to NG tube placement  HLD (hyperlipidemia) - Hold Zocor  Chronic kidney disease, stage 3a ([place]) Renal function stable.  GFR> 60. -Follow-up by BMP  COPD (chronic obstructive pulmonary disease) ([place]) Started nebulizers today.  Coffee ground emesis Hemoglobin stable.  Continue  Protonix.  Tobacco abuse -nicotine patch  Depression with  anxiety Hold home oral Xanax and Zoloft -As needed IV Ativan for anxiety        Subjective: Patient not feeling to them.  Seen this morning and abdomen was distended.  Called to see patient again after vomiting after contrast given.  Physical Exam: Vitals:   05/30/22 2024 05/31/22 0455 05/31/22 0826 05/31/22 0954  BP: (!) 155/92 (!) 163/98 (!) 172/102 (!) 151/110  Pulse: 95 (!) 102 96 92  Resp: 18  18   Temp: 99.1 F (37.3 C) 97.6 F (36.4 C) 98.2 F (36.8 C)   TempSrc: Oral Oral Oral   SpO2: 95% 98% 99% 99%  Weight:      Height:       Physical Exam HENT:     Head: Normocephalic.     Mouth/Throat:     Pharynx: No oropharyngeal exudate.  Eyes:     General: Lids are normal.     Conjunctiva/sclera: Conjunctivae normal.  Cardiovascular:     Rate and Rhythm: Normal rate and regular rhythm.     Heart sounds: Normal heart sounds, S1 normal and S2 normal.  Pulmonary:     Breath sounds: No decreased breath sounds, wheezing, rhonchi or rales.  Abdominal:     General: There is distension.     Palpations: Abdomen is soft.     Tenderness: There is abdominal tenderness.  Musculoskeletal:     Right lower leg: No swelling.     Left lower leg: No swelling.  Skin:    General: Skin is warm.     Findings: No rash.  Neurological:     Mental Status: He is alert.     Data Reviewed: CT scan of the abdomen still pending Abdominal x-ray and chest x-ray showed dilation of the small bowel margin  Family Communication: Spoke with family at the nursing station.  Disposition: Status is: Inpatient Remains inpatient appropriate because: Still having dilated bowel  Planned Discharge Destination: Rehab    Time spent: 28 minutes  Author: Loletha Grayer, MD 05/31/2022 2:46 PM  For on call review [email or web address].

## 2022-05-31 NOTE — Assessment & Plan Note (Addendum)
MRI showing multilevel cervical spondylosis most pronounced at C3-C4 where moderate to severe canal stenosis and severe bilateral foraminal stenosis.

## 2022-05-31 NOTE — Assessment & Plan Note (Addendum)
General surgery does not think this is bowel obstruction.  Gastroenterology gave neostigmine infusion on 05/31/2022 and on 06/01/22.  Decompressive colonoscopy procedure on 06/02/2022 and on 06/06/2022 and left a rectal tube in. Patient did not do well with barium swallow exam.  Patient currently on TPN.  Patient's abdomen is less distended than last week but still has rectal tube and draining the air out.  Dr. Dahlia Byes to take to the operating room today.

## 2022-05-31 NOTE — Assessment & Plan Note (Addendum)
Blood pressure stable off of medications.

## 2022-05-31 NOTE — Plan of Care (Signed)

## 2022-05-31 NOTE — Assessment & Plan Note (Deleted)
Last creatinine 0.9 with a GFR greater than 60

## 2022-05-31 NOTE — Assessment & Plan Note (Signed)
Hold home oral Xanax and Zoloft -As needed IV Ativan for anxiety

## 2022-05-31 NOTE — Assessment & Plan Note (Addendum)
-  nicotine patch down to 14 mcg.

## 2022-05-31 NOTE — Assessment & Plan Note (Addendum)
-  Last EF 50% -Hold Lasix

## 2022-05-31 NOTE — Progress Notes (Addendum)
[facility] SURGICAL PROGRESS NOTE (cpt [id number])  Hospital Day(s): 3.   Interval History: Patient seen and examined. No issues overnight. This morning, he is resting comfortably. Still poor participant. Only complaint is foot pain. Remains without leukocytosis; 7.3K. Bump is renal function returned to baseline; sCr - 1.22; UO - 700 ccs + unmeasured. NGT output 400 ccs; this is clearing significantly. Still uncertain of any bowel function. Did get methylnaltrexone yesterday (09/26). KUB pending  Review of Systems:  Unable to reliably preform this morning secondary to somnolence, lack of patient participation, no family at bedside to assist  Vital signs in last 24 hours: [min-max] current  Temp:  [97.6 F (36.4 C)-99.4 F (37.4 C)] 97.6 F (36.4 C) (09/27 0455) Pulse Rate:  [86-102] 102 (09/27 0455) Resp:  [16-18] 18 (09/26 2024) BP: (134-167)/(89-99) 163/98 (09/27 0455) SpO2:  [95 %-99 %] 98 % (09/27 0455)     Height: '5\' 11"'$  (180.3 cm) Weight: 95.1 kg BMI (Calculated): 29.25   Intake/Output last 2 shifts:  09/26 0701 - 09/27 0700 In: 1847 [I.V.:1309.4; NG/GT:180; IV Piggyback:357.6] Out: 1100 [Urine:700; Emesis/NG output:400]   Physical Exam:  Constitutional: somnolent, arouses briefly, NAD HENT: normocephalic without obvious abnormality; NGT in place Eyes: PERRL, EOM's grossly intact and symmetric  Respiratory: breathing non-labored at rest; on [place] Cardiovascular: regular rate and sinus rhythm  Gastrointestinal: Soft, remains distended (unchanged), no apparent tenderness, no rebound/guarding. He is certainly not peritonitic    Labs:     Latest Ref Rng & Units 05/31/2022    3:42 AM 05/30/2022    2:59 AM 05/29/2022    8:24 AM  CBC  WBC 4.0 - 10.5 K/uL 7.3  8.0  10.4   Hemoglobin 13.0 - 17.0 g/dL 13.3  12.3  13.9   Hematocrit 39.0 - 52.0 % 41.6  39.0  44.0   Platelets 150 - 400 K/uL 169  170  195       Latest Ref Rng & Units 05/31/2022    3:42 AM 05/30/2022     2:59 AM 05/29/2022    7:42 AM  CMP  Glucose 70 - 99 mg/dL 80  80  112   BUN 8 - 23 mg/dL '18  18  13   '$ Creatinine 0.61 - 1.24 mg/dL 1.22  1.41  1.26   Sodium 135 - 145 mmol/L 144  141  141   Potassium 3.5 - 5.1 mmol/L 3.7  3.7  4.3   Chloride 98 - 111 mmol/L 110  111  108   CO2 22 - 32 mmol/L '21  24  26   '$ Calcium 8.9 - 10.3 mg/dL 8.9  8.4  9.1   Total Protein 6.5 - 8.1 g/dL   7.3   Total Bilirubin 0.3 - 1.2 mg/dL   0.8   Alkaline Phos 38 - 126 U/L   61   AST 15 - 41 U/L   18   ALT 0 - 44 U/L   13     Imaging studies: No new imaging studies at this time; KUB pending    Assessment/Plan: (ICD-10's: K48.609) 71 y.o. male with possible small bowel obstruction also with stable appearance of chronic colonic distension, complicated by significant comorbid disease.    - NGT output slowing and clearing, poor historian and bowel function unclear; however, I do think it is reasonable to do NGT clamping trial this AM. Clamped NGT this morning at 0700. Leave clamped x4 hours and check residuals. If residuals are <150 ccs, I think we can  trial CLD.               - No emergent surgical intervention. He is a sub optimal candidate for any surgical intervention given his deconditioning and significant comorbid disease            - Monitor abdominal examination; on-going bowel function - Consider serial KUBs; pending this AM            - Pain control prn; limit narcotics as much as feasible - Antiemetics prn - Mobilize as feasible   - Appreciate GI input; methylnaltrexone given 09/26 - Appreciate neurology input - Further management per primary service  All of the above findings and recommendations were discussed with the medical team. No family at bedside this morning.    -- Edison Simon, PA-C [facility] 05/31/2022, 7:11 AM M-F: 7am - 4pm

## 2022-05-31 NOTE — Assessment & Plan Note (Addendum)
No chest pain.  Troponin negative x2

## 2022-05-31 NOTE — Assessment & Plan Note (Addendum)
Hold Zocor

## 2022-05-31 NOTE — Progress Notes (Signed)
Pt seen and examined. Persistent abd distension. D/W radiologist. Very unclear picture as he has chronic large bowel dilation. Prior hx of SBO, prior sigmoid colectomy for CA. HE is wheelchair bound and is encephalopathic. Able to follow very simple commands. C/o abd pain. We will obtain repeat CT to try to evaluate small bowel, unfortunately he could not tolerate PO contrast.. Anticipate significant challenges with w/u. No good solutions.At this time no need for surgical intervention, I agree that he is a suboptimal candidate given neurological condition w exacerbated encephalopathy and limited mobility

## 2022-05-31 NOTE — Progress Notes (Signed)
GI Inpatient Follow-up Note  Subjective:  Patient seen and unable to answer questions adequately.  Scheduled Inpatient Medications:   budesonide (PULMICORT) nebulizer solution  0.25 mg Nebulization BID   iohexol  500 mL Oral Q1H   ipratropium-albuterol  3 mL Nebulization Q6H   LORazepam  1 mg Intravenous QHS   nicotine  21 mg Transdermal Daily   pantoprazole (PROTONIX) IV  40 mg Intravenous Q12H    Continuous Inpatient Infusions:    sodium chloride Stopped (05/30/22 1820)   levETIRAcetam 1,000 mg (05/31/22 0843)   valproate sodium 250 mg (05/31/22 1159)    PRN Inpatient Medications:  acetaminophen **OR** acetaminophen (TYLENOL) oral liquid 160 mg/5 mL **OR** acetaminophen, albuterol, hydrALAZINE, LORazepam, LORazepam, nitroGLYCERIN, ondansetron (ZOFRAN) IV, phenol, senna-docusate, trolamine salicylate  Review of Systems:  Unable to assess   Physical Examination: BP (!) 151/110 (BP Location: Left Arm)   Pulse 92   Temp 98.2 F (36.8 C) (Oral)   Resp 18   Ht '5\' 11"'$  (1.803 m)   Wt 95.1 kg   SpO2 99%   BMI 29.24 kg/m  Gen: NAD, ng tube in place HEENT: PEERLA, EOMI, Neck: soft Chest: No respiratory distress Abd: distended, more firm than yesterday Ext: no edema, well perfused with 2+ pulses, Skin: no rash or lesions noted Lymph: no LAD  Data: Lab Results  Component Value Date   WBC 7.3 05/31/2022   HGB 13.3 05/31/2022   HCT 41.6 05/31/2022   MCV 94.3 05/31/2022   PLT 169 05/31/2022   Recent Labs  Lab 05/29/22 0824 05/30/22 0259 05/31/22 0342  HGB 13.9 12.3* 13.3   Lab Results  Component Value Date   NA 144 05/31/2022   K 3.7 05/31/2022   CL 110 05/31/2022   CO2 21 (L) 05/31/2022   BUN 18 05/31/2022   CREATININE 1.22 05/31/2022   Lab Results  Component Value Date   ALT 13 05/29/2022   AST 18 05/29/2022   ALKPHOS 61 05/29/2022   BILITOT 0.8 05/29/2022   Recent Labs  Lab 05/28/22 1258 05/29/22 0742  APTT 31  --   INR  --  1.0    Assessment/Plan: Mr. Reihl is a 71 y.o. gentleman with history of chronic ogilvie's syndrome, COPD, hypertension, hyperlipidemia, and multiple other medical comorbidities here with SBO and we have been consulted for chronic ogilvie's syndrome. Abdomen seems more distended today, unclear if more ileus and small bowel vs worsening ogilvie's. IV methylnaltrexone was ineffective  Recommendations:  - could consider repeat neostigmine therapy for him and premedicate with glycopyrrolate. This would require closer monitoring in at least step down in the ICU. Can f/u on CT scan first. - IV hydration and replete electrolytes aggressively - avoid narcotics at ALL cost - could consider empiric SIBO treatment   Will continue to follow, please call with any questions or concerns.  Raylene Miyamoto MD, MPH [place]

## 2022-05-31 NOTE — Progress Notes (Signed)
Pt received 544m of contrast per NG tube. Was sitting at approx 45 degrees- 50 degrees while giving. Almost immediately after giving last dose pt began to cough extensively and then vomited coughing some more. Choking sounds were also being made by pt. Connected NG back up to intermittent suction. Cleaned pt up, changing sheets and gown as well. Made MD and surgical team aware.

## 2022-06-01 ENCOUNTER — Inpatient Hospital Stay: Payer: Medicare Other

## 2022-06-01 DIAGNOSIS — K56609 Unspecified intestinal obstruction, unspecified as to partial versus complete obstruction: Secondary | ICD-10-CM | POA: Diagnosis not present

## 2022-06-01 DIAGNOSIS — I4892 Unspecified atrial flutter: Secondary | ICD-10-CM | POA: Diagnosis not present

## 2022-06-01 DIAGNOSIS — M4802 Spinal stenosis, cervical region: Secondary | ICD-10-CM | POA: Diagnosis not present

## 2022-06-01 DIAGNOSIS — K5981 Ogilvie syndrome: Secondary | ICD-10-CM | POA: Diagnosis not present

## 2022-06-01 DIAGNOSIS — J690 Pneumonitis due to inhalation of food and vomit: Secondary | ICD-10-CM

## 2022-06-01 LAB — ECHOCARDIOGRAM COMPLETE
Height: 71 in
S' Lateral: 2.7 cm
Weight: 3386.27 oz

## 2022-06-01 LAB — BLOOD GAS, VENOUS
Acid-base deficit: 0.2 mmol/L (ref 0.0–2.0)
Bicarbonate: 22.1 mmol/L (ref 20.0–28.0)
O2 Saturation: 95.4 %
Patient temperature: 37
pCO2, Ven: 29 mmHg — ABNORMAL LOW (ref 44–60)
pH, Ven: 7.49 — ABNORMAL HIGH (ref 7.25–7.43)
pO2, Ven: 65 mmHg — ABNORMAL HIGH (ref 32–45)

## 2022-06-01 LAB — PROTIME-INR
INR: 1.2 (ref 0.8–1.2)
Prothrombin Time: 15.2 seconds (ref 11.4–15.2)

## 2022-06-01 LAB — VITAMIN A: Vitamin A (Retinoic Acid): 19.4 ug/dL — ABNORMAL LOW (ref 22.0–69.5)

## 2022-06-01 LAB — VITAMIN E
Vitamin E (Alpha Tocopherol): 7.3 mg/L — ABNORMAL LOW (ref 9.0–29.0)
Vitamin E(Gamma Tocopherol): 1.1 mg/L (ref 0.5–4.9)

## 2022-06-01 LAB — HEPARIN LEVEL (UNFRACTIONATED): Heparin Unfractionated: 0.57 IU/mL (ref 0.30–0.70)

## 2022-06-01 LAB — APTT: aPTT: 35 seconds (ref 24–36)

## 2022-06-01 MED ORDER — LISINOPRIL 5 MG PO TABS
10.0000 mg | ORAL_TABLET | Freq: Every day | ORAL | Status: DC
  Administered 2022-06-01: 10 mg

## 2022-06-01 MED ORDER — IPRATROPIUM-ALBUTEROL 0.5-2.5 (3) MG/3ML IN SOLN
3.0000 mL | Freq: Two times a day (BID) | RESPIRATORY_TRACT | Status: DC
  Administered 2022-06-02 – 2022-06-08 (×14): 3 mL via RESPIRATORY_TRACT
  Filled 2022-06-01 (×14): qty 3

## 2022-06-01 MED ORDER — PIPERACILLIN-TAZOBACTAM 3.375 G IVPB
3.3750 g | Freq: Three times a day (TID) | INTRAVENOUS | Status: AC
  Administered 2022-06-01 – 2022-06-05 (×13): 3.375 g via INTRAVENOUS
  Filled 2022-06-01 (×13): qty 50

## 2022-06-01 MED ORDER — IPRATROPIUM-ALBUTEROL 0.5-2.5 (3) MG/3ML IN SOLN
3.0000 mL | RESPIRATORY_TRACT | Status: DC
  Administered 2022-06-01: 3 mL via RESPIRATORY_TRACT
  Filled 2022-06-01: qty 3

## 2022-06-01 MED ORDER — OSMOLITE 1.5 CAL PO LIQD
1000.0000 mL | ORAL | Status: DC
  Administered 2022-06-01: 1000 mL

## 2022-06-01 MED ORDER — LIDOCAINE VISCOUS HCL 2 % MT SOLN
15.0000 mL | Freq: Four times a day (QID) | OROMUCOSAL | Status: DC | PRN
  Filled 2022-06-01: qty 15

## 2022-06-01 MED ORDER — HEPARIN (PORCINE) 25000 UT/250ML-% IV SOLN
1200.0000 [IU]/h | INTRAVENOUS | Status: AC
  Administered 2022-06-01: 1400 [IU]/h via INTRAVENOUS
  Filled 2022-06-01: qty 250

## 2022-06-01 MED ORDER — DEXAMETHASONE SODIUM PHOSPHATE 10 MG/ML IJ SOLN
10.0000 mg | Freq: Once | INTRAMUSCULAR | Status: AC
  Administered 2022-06-01: 10 mg via INTRAVENOUS
  Filled 2022-06-01: qty 1

## 2022-06-01 MED ORDER — IPRATROPIUM-ALBUTEROL 0.5-2.5 (3) MG/3ML IN SOLN
3.0000 mL | Freq: Three times a day (TID) | RESPIRATORY_TRACT | Status: DC
  Administered 2022-06-01 (×2): 3 mL via RESPIRATORY_TRACT
  Filled 2022-06-01 (×2): qty 3

## 2022-06-01 MED ORDER — GLYCOPYRROLATE 0.2 MG/ML IJ SOLN
0.1000 mg | Freq: Once | INTRAMUSCULAR | Status: AC
  Administered 2022-06-01: 0.1 mg via INTRAVENOUS
  Filled 2022-06-01: qty 1

## 2022-06-01 MED ORDER — METOPROLOL SUCCINATE ER 50 MG PO TB24: 25.0000 mg | ORAL_TABLET | Freq: Every day | ORAL | Status: DC

## 2022-06-01 MED ORDER — BOOST / RESOURCE BREEZE PO LIQD CUSTOM: 1.0000 | Freq: Three times a day (TID) | ORAL | Status: DC

## 2022-06-01 MED ORDER — GABAPENTIN 250 MG/5ML PO SOLN
400.0000 mg | Freq: Two times a day (BID) | ORAL | Status: DC
  Administered 2022-06-01 – 2022-06-06 (×11): 400 mg
  Filled 2022-06-01 (×13): qty 8

## 2022-06-01 MED ORDER — HEPARIN BOLUS VIA INFUSION
4000.0000 [IU] | Freq: Once | INTRAVENOUS | Status: AC
  Administered 2022-06-01: 4000 [IU] via INTRAVENOUS
  Filled 2022-06-01: qty 4000

## 2022-06-01 MED ORDER — DEXAMETHASONE SODIUM PHOSPHATE 4 MG/ML IJ SOLN
4.0000 mg | Freq: Four times a day (QID) | INTRAMUSCULAR | Status: AC
  Administered 2022-06-01 – 2022-06-02 (×2): 4 mg via INTRAVENOUS
  Filled 2022-06-01 (×2): qty 1

## 2022-06-01 MED ORDER — FREE WATER
30.0000 mL | Status: DC
  Administered 2022-06-01 (×3): 30 mL

## 2022-06-01 MED ORDER — MORPHINE SULFATE (PF) 2 MG/ML IV SOLN
0.5000 mg | INTRAVENOUS | Status: DC | PRN
  Administered 2022-06-01 – 2022-06-04 (×4): 0.5 mg via INTRAVENOUS
  Filled 2022-06-01 (×4): qty 1

## 2022-06-01 MED ORDER — RACEPINEPHRINE HCL 2.25 % IN NEBU
0.5000 mL | INHALATION_SOLUTION | Freq: Once | RESPIRATORY_TRACT | Status: AC
  Administered 2022-06-01: 0.5 mL via RESPIRATORY_TRACT
  Filled 2022-06-01: qty 0.5

## 2022-06-01 MED ORDER — BISACODYL 10 MG RE SUPP
10.0000 mg | Freq: Once | RECTAL | Status: AC
  Administered 2022-06-01: 10 mg via RECTAL
  Filled 2022-06-01: qty 1

## 2022-06-01 MED ORDER — BUDESONIDE 0.5 MG/2ML IN SUSP
0.5000 mg | Freq: Two times a day (BID) | RESPIRATORY_TRACT | Status: DC
  Administered 2022-06-01 – 2022-06-12 (×21): 0.5 mg via RESPIRATORY_TRACT
  Filled 2022-06-01 (×21): qty 2

## 2022-06-01 MED ORDER — LISINOPRIL 5 MG PO TABS
10.0000 mg | ORAL_TABLET | Freq: Every day | ORAL | Status: DC
  Filled 2022-06-01: qty 2

## 2022-06-01 MED ORDER — NEOSTIGMINE METHYLSULFATE 10 MG/10ML IV SOLN
1.5000 mg | Freq: Once | INTRAVENOUS | Status: AC
  Administered 2022-06-01: 1.5 mg via INTRAVENOUS
  Filled 2022-06-01: qty 1.5

## 2022-06-01 MED ORDER — METOPROLOL TARTRATE 25 MG PO TABS
12.5000 mg | ORAL_TABLET | Freq: Two times a day (BID) | ORAL | Status: DC
  Administered 2022-06-01 – 2022-06-07 (×10): 12.5 mg
  Filled 2022-06-01 (×10): qty 1

## 2022-06-01 NOTE — Progress Notes (Signed)
Updated pt's daughter [name] at bedside regarding previous decline in respiratory status of which PCCM has been consulted.  Respiratory status slowly improving following decadron and racemic epi.  But remains high risk for intubation.  All questions answered.   Darel Hong, AGACNP-BC [facility] Prefer epic messenger for cross cover needs If after hours, please call E-link

## 2022-06-01 NOTE — Progress Notes (Signed)
PT Cancellation Note  Patient Details Name: Calvin [name] MRN: [id number] DOB: [birth year]   Cancelled Treatment:    Reason Eval/Treat Not Completed: Other (comment). Per chart review, pt transferred to CCU  because gastroenterology would like to do neostigmine infusion.  Patient transferred to a heart monitoring unit for this. PT to complete order at this time and await new orders when pt is medically able to participate in therapeutic intervention.   Lieutenant Diego PT, DPT 1:13 PM,06/01/22

## 2022-06-01 NOTE — Progress Notes (Signed)
OT Cancellation Note  Patient Details Name: Calvin [name] MRN: [id number] DOB: [birth year]   Cancelled Treatment:    Reason Eval/Treat Not Completed: Medical issues which prohibited therapy. Per chart review, pt transferred to CCU  because gastroenterology would like to do neostigmine infusion.  Patient transferred to a heart monitoring unit for this.OT to complete order at this time and await new orders when pt is medically able to participate in therapeutic intervention.   Darleen Crocker, [place], OTR/L , CBIS ascom [phone]  06/01/22, 8:55 AM

## 2022-06-01 NOTE — Progress Notes (Addendum)
Progress Note   Patient: Calvin [name] ACZ:[id number] DOB: [birth year] DOA: 05/28/2022     4 DOS: the patient was seen and examined on 06/01/2022   Brief hospital course: Taken from H&P.  Calvin [name] is a 71 y.o. male with medical history significant of Ogilvie's syndrome, ileus, hypertension, hyperlipidemia, COPD, stroke with mild left-sided weakness, GERD, gout, depression with anxiety, seizure, CAD, MI,  alcohol abuse, CKD stage IIIa, dCHF, colon cancer, thyroid cancer, who presents from his facility with Nausea, vomiting, abdominal distention, abdominal pain for 2 days. Found to have new small bowel obstruction on CT abdomen with transition point in the right lower abdomen suspicious for adhesion. General surgery was consulted and NG tube was placed.  Patient also developed left-sided tingling and numbness and facial droop while in ED, has an history of left-sided stroke with residual weakness.  Symptoms resolved within few minutes. CT head and MRI brain was negative for any acute intracranial abnormality and do reflect a chronic changes. 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 Neurology was consulted-patient to complete stroke work-up.  9/25: Labs with mildly elevated creatinine at 1.26 with baseline seems to be around 1.1-1.2.  Lipid panel normal with LDL of 57 CBC with mild leukocytosis at 11.1.  Hemoglobin stable,A1c 5.8 Repeat abdominal x-ray with stable obstructive findings and no other acute abnormality.  Continued to have significant colonic distention.  Passing flatus, minimum secretions with NG tube. general surgery would like to continue with conservative management with NG tube. Speech and cognition appears to be at baseline per speech evaluation. Patient uses quite a bit of opioids for chronic pain which can be contributory.  Try avoiding opioids if possible. We can try a Toradol as needed for pain with a close monitoring of renal function.  9/26: Patient  overnight complained about chest pain, on evaluation by night cross coverage, he was complaining more of generalized aches and pain but did responded to nitroglycerin x1.  EKG with sinus tachycardia, troponin 20>> 23. Patient was given a dose of morphine.  Renal function with slight worsening of creatinine to 1.41 on 05/30/2022.  We will avoid Toradol and pain will be managed with morphine although can contribute to his slower bowel function.  Repeat abdominal x-ray with air-filled distention of large and small bowel loops favoring ileus. CT scan on 06/01/2022 did not show any signs of obstruction, small bowel last dilated.  Still had dilation of the colon.  The patient was transferred to the stepdown unit for neostigmine dosing on 05/31/2022 and 06/01/2022.  General surgery to start clear liquid diet and see how that goes.   Assessment and Plan: Ogilvie's syndrome General surgery does not think this is bowel obstruction.  Gastroenterology gave neostigmine infusion on 05/31/2022 and will repeat on 06/01/2022.  Clear liquid diet will be started today and see how things go.  Paroxysmal atrial flutter (HCC) Restart Toprol-XL.  Started heparin drip for now.  EF 50%  Aspiration pneumonia (HCC) Started Zosyn  Cervical spinal stenosis MRI showing multilevel cervical spondylosis most pronounced at C3-C4 where moderate to severe canal stenosis and severe bilateral foraminal stenosis.  Patient is not a great surgical candidate at this point.  Chronic diastolic CHF (congestive heart failure) (HCC) -Last EF 50% -Hold Lasix -Watch closely with IV fluids  Seizures (HCC) Seizure -Seizure precaution -When necessary Ativan for seizure -IV Keppra and Depakote.    HTN (hypertension) Surgery starting clear liquid diet we will start Toprol-XL and lisinopril.  CAD (coronary artery  disease) No chest pain.  Troponin negative x2 -Hold aspirin and Zocor.  Restart Toprol-XL.  HLD (hyperlipidemia) - Hold  Zocor  Chronic kidney disease, stage 3a (HCC) Last creatinine 1.22 with a GFR greater than 60  COPD (chronic obstructive pulmonary disease) (HCC) Continue nebulizers  Coffee ground emesis Hemoglobin stable.  Continue Protonix.  Tobacco abuse -nicotine patch  Depression with anxiety Hold home oral Xanax and Zoloft -As needed IV Ativan for anxiety        Subjective: Patient slow with his answers.  No bowel movement yet.  Admitted with abdominal distention.  Physical Exam: Vitals:   06/01/22 0800 06/01/22 0900 06/01/22 1000 06/01/22 1100  BP: (!) 180/105 (!) 154/104 (!) 154/97 (!) 165/102  Pulse: 94 94 96 95  Resp:      Temp:      TempSrc:      SpO2: 96% 94% 92% 93%  Weight:      Height:       Physical Exam HENT:     Head: Normocephalic.     Mouth/Throat:     Pharynx: No oropharyngeal exudate.  Eyes:     General: Lids are normal.     Conjunctiva/sclera: Conjunctivae normal.  Cardiovascular:     Rate and Rhythm: Normal rate and regular rhythm.     Heart sounds: Normal heart sounds, S1 normal and S2 normal.  Pulmonary:     Breath sounds: No decreased breath sounds, wheezing, rhonchi or rales.  Abdominal:     General: Bowel sounds are decreased. There is distension.     Palpations: Abdomen is soft.     Tenderness: There is abdominal tenderness.  Musculoskeletal:     Right lower leg: Swelling present.     Left lower leg: Swelling present.  Skin:    General: Skin is warm.     Findings: No rash.  Neurological:     Mental Status: He is alert.     Data Reviewed: CT scan did not show any evidence of small bowel obstruction normal caliber small bowel loops, persisting gaseous distention of the colon likely due to pseudoobstruction.  New linear nodular opacity left lower lobe could be secondary to aspiration, solid 5 mm pulmonary nodule right lower lobe stable from prior exams  Family Communication: Spoke with patient's daughter on the  phone  Disposition: Status is: Inpatient Remains inpatient appropriate because: Getting another neostigmine dose.  Planned Discharge Destination: Rehab    Time spent: 28 minutes Case discussed with nursing staff, gastroenterology, general surgery and dietitian  Author: Loletha Grayer, MD 06/01/2022 11:58 AM  For on call review [email or web address].

## 2022-06-01 NOTE — Assessment & Plan Note (Addendum)
Currently rate controlled.  On as needed IV metoprolol.  We will hold off on major anticoagulation today until we determine if the patient needs a PEG or not.  DVT prophylaxis ordered.

## 2022-06-01 NOTE — Consult Note (Signed)
ANTICOAGULATION CONSULT NOTE  Pharmacy Consult for IV Heparin Indication: atrial fibrillation  Patient Measurements: Height: '5\' 11"'$  (180.3 cm) Weight: 96 kg (211 lb 10.3 oz) IBW/kg (Calculated) : 75.3 Heparin Dosing Weight: 94.7 kg  Labs: Recent Labs    05/30/22 0259 05/30/22 0449 05/31/22 0342 06/01/22 0740 06/01/22 1741  HGB 12.3*  --  13.3  --   --   HCT 39.0  --  41.6  --   --   PLT 170  --  169  --   --   APTT  --   --   --  35  --   LABPROT  --   --   --  15.2  --   INR  --   --   --  1.2  --   HEPARINUNFRC  --   --   --   --  0.57  CREATININE 1.41*  --  1.22  --   --   TROPONINIHS 20* 23*  --   --   --      Estimated Creatinine Clearance: 63.8 mL/min (by C-G formula based on SCr of 1.22 mg/dL).   Medical History: Past Medical History:  Diagnosis Date   Alcohol abuse    drinks on weekend   Anemia    Anxiety    Arthritis    Cancer (HCC)    colon,throat   COPD (chronic obstructive pulmonary disease) ([place])    Coronary artery disease    Depression    Gout    Hypertension    Myocardial infarction ([place])    Neuromuscular disorder ([place])    Seizures ([place])    last 6 months ago   Stroke [facility])    multiple  left side weakness   Tremors of nervous system     Medications:  No anticoagulation prior to admission per my chart review  Assessment: Patient is a 71 y/o M with medical history as above who is admitted with SBO in setting of chronic Ogilvie's syndrome. Pharmacy consulted to initiate heparin infusion for atrial fibrillation.   Date Time  HL Rate/Comment 9/28 1741 0.57 Therapeutic x1; 1400 un/hr     Baseline aPTT and PT-INR are pending. Baseline CBC within normal limits.   Goal of Therapy:  Heparin level 0.3-0.7 units/ml Monitor platelets by anticoagulation protocol: Yes   Plan:  Heparin level therapeutic x1 9/28 '@1741'$ . --Continue heparin rate at 1400 units/hr -- Confirm with heparin level 8 hours; then CTM daily once consecutively  therapeutic --Daily CBC per protocol while on IV heparin  Lorna Dibble, PharmD, [facility] Clinical Pharmacist 06/01/2022 6:47 PM

## 2022-06-01 NOTE — Progress Notes (Addendum)
[facility] SURGICAL PROGRESS NOTE (cpt [phone])  Hospital Day(s): 4.   Interval History: Patient seen and examined. Patient now in step-down secondary to getting neostigmine yesterday afternoon. This morning, he is receiving breathing treatment. Complaining about safety mittens. Does not appear to have any abdominal pain but he is unreliable historian. Remains distended. Uncertain of bowel function. NGT back to LIS but with only 50-100 ccs of clear bilious fluid. KUB pending.   Review of Systems:  Unable to reliably preform this morning secondary confusion, poor historian, no family at bedside to assist  Vital signs in last 24 hours: [min-max] current  Temp:  [98.2 F (36.8 C)-99.2 F (37.3 C)] 98.2 F (36.8 C) (09/28 0400) Pulse Rate:  [45-132] 89 (09/28 0600) Resp:  [14-31] 18 (09/28 0600) BP: (118-172)/(71-110) 156/102 (09/28 0600) SpO2:  [92 %-100 %] 96 % (09/28 0600) Weight:  [96 kg] 96 kg (09/27 1540)     Height: '5\' 11"'$  (180.3 cm) Weight: 96 kg BMI (Calculated): 29.53   Intake/Output last 2 shifts:  09/27 0701 - 09/28 0700 In: 1212 [I.V.:863.8; IV Piggyback:348.2] Out: 351 [Urine:351]   Physical Exam:  Constitutional: Alert, NAD, receiving breathing treatment  HENT: normocephalic without obvious abnormality; NGT in place Eyes: PERRL, EOM's grossly intact and symmetric  Respiratory: breathing non-labored at rest; receiving breathing treatment Cardiovascular: Appears regular on monitor, 89 bpm Gastrointestinal: Soft, remains distended (unchanged), no apparent tenderness, no rebound/guarding. He is certainly not peritonitic    Labs:     Latest Ref Rng & Units 05/31/2022    3:42 AM 05/30/2022    2:59 AM 05/29/2022    8:24 AM  CBC  WBC 4.0 - 10.5 K/uL 7.3  8.0  10.4   Hemoglobin 13.0 - 17.0 g/dL 13.3  12.3  13.9   Hematocrit 39.0 - 52.0 % 41.6  39.0  44.0   Platelets 150 - 400 K/uL 169  170  195       Latest Ref Rng & Units 05/31/2022    3:42 AM 05/30/2022     2:59 AM 05/29/2022    7:42 AM  CMP  Glucose 70 - 99 mg/dL 80  80  112   BUN 8 - 23 mg/dL '18  18  13   '$ Creatinine 0.61 - 1.24 mg/dL 1.22  1.41  1.26   Sodium 135 - 145 mmol/L 144  141  141   Potassium 3.5 - 5.1 mmol/L 3.7  3.7  4.3   Chloride 98 - 111 mmol/L 110  111  108   CO2 22 - 32 mmol/L '21  24  26   '$ Calcium 8.9 - 10.3 mg/dL 8.9  8.4  9.1   Total Protein 6.5 - 8.1 g/dL   7.3   Total Bilirubin 0.3 - 1.2 mg/dL   0.8   Alkaline Phos 38 - 126 U/L   61   AST 15 - 41 U/L   18   ALT 0 - 44 U/L   13     Imaging studies: No new imaging studies at this time; KUB pending    Assessment/Plan: (ICD-10's: K66.609) 71 y.o. male with radiographically resolved small bowel obstruction also with stable appearance of chronic colonic distension, complicated by significant comorbid disease, physical deconditioning, overall poor functional status.   - Given lack of small bowel distension on CT yesterday and minimal NGT output, I have low clinical suspicion for small bowel obstruction. At a minimum, I believe we can clamp NGT and trial him on CLD. If he  tolerates, I think it is reasonable to remove NGT. All dilation appears colonic, and NGT will not help this secondary to ileocecal valve. Of course, if he develops nausea/emesis, NGT can be returned to LIS for symptom control.               - No emergent surgical intervention. He is a sub optimal candidate for any surgical intervention given his deconditioning and significant comorbid disease            - Monitor abdominal examination; on-going bowel function - Consider serial KUBs; pending this AM            - Pain control prn; limit narcotics as much as feasible - Antiemetics prn - Mobilize as feasible   - Appreciate GI input; neostigmine given 09/27 - Further management per primary service  All of the above findings and recommendations were discussed with the medical team. No family at bedside this morning.    -- Edison Simon, PA-C [facility] 06/01/2022, 8:10 AM M-F: 7am - 4pm

## 2022-06-01 NOTE — Evaluation (Signed)
Clinical/Bedside Swallow Evaluation Patient Details  Name: Calvin [name] MRN: [id number] Date of Birth: 09-27-1950  Today's Date: 06/01/2022 Time: SLP Start Time (ACUTE ONLY): 72 SLP Stop Time (ACUTE ONLY): 1510 SLP Time Calculation (min) (ACUTE ONLY): 50 min  Past Medical History:  Past Medical History:  Diagnosis Date   Alcohol abuse    drinks on weekend   Anemia    Anxiety    Arthritis    Cancer ([place])    colon,throat   COPD (chronic obstructive pulmonary disease) ([place])    Coronary artery disease    Depression    Gout    Hypertension    Myocardial infarction ([place])    Neuromuscular disorder ([place])    Seizures ([place])    last 6 months ago   Stroke [facility])    multiple  left side weakness   Tremors of nervous system    Past Surgical History:  Past Surgical History:  Procedure Laterality Date   CARPAL TUNNEL RELEASE Left 10/19/2015   Procedure: CARPAL TUNNEL RELEASE;  Surgeon: Hessie Knows, MD;  Location: [facility] ORS;  Service: Orthopedics;  Laterality: Left;   COLON SURGERY     COLONOSCOPY WITH PROPOFOL N/A 10/26/2021   Procedure: COLONOSCOPY WITH PROPOFOL;  Surgeon: Lesly Rubenstein, MD;  Location: [facility] ENDOSCOPY;  Service: Endoscopy;  Laterality: N/A;   JOINT REPLACEMENT     left partial hip    THROAT SURGERY  2013   cancer   HPI:  Pt is 89 YOM admitted for small bowel obstruction and abdominal distention. PMH includes: CVA w L sided weakness, dCHF, seizures, CAD, HLD, CKD3a, COPD, tobacco abuse, depression, anxiety, and ogilive's syndrome.  Surgery consulted: "abdominal pain, distension, nausea, and emesis thought to be secondary to possible small bowel obstruction also with stable appearance of chronic colonic distension, complicated by significant comorbid disease.".   CT of Abd: Lower chest: Stable small solid pulmonary nodule of the right lower  lobe measuring 5 mm on series 4, image 1. New mild linear nodular  opacity of the left lower lobe.    Assessment / Plan /  Recommendation  Clinical Impression   Pt seen for BSE today. Pt awake, resting in bed w/ min abdominal breathing, calling out and c/o discomfort. NSG aware. Wet respirations and vocal quality at rest; congested cough. NGT present. NSG reported having to suction pt ~15x today d/t increased native, pharyngeal secretions.  On RA; RR 27-29; O2 sats mid90s. HR 107.    At this assessment today, pt w/ eyes open and intermittently communicating w/ few words/phonations in response to moving about in bed and questions about his discomfort but no direct verbal communication in conversation; Calling out at times d/t discomfort w/ noted increased abdominal breathing. State waxes and wantes it appears. MD requested this reassessment d/t wanting to trial a clear liquid diet today -- in setting of pt's abdominal issues/illness. NGT present.    Pt presents w/ oropharyngeal phase Dysphagia in setting of declined Medical and Cognitive status'; acute illness including abdominal issues. Pulmonary status appears declined -- pt is NOT able to manage his own native secretions: MODERATE wet vocal quality and wet exhalations noted. Suspect MODERATE+ pharyngo/laryngeal secretions impacting airway. Pt appears to have decreased sensation to the secretions as he does NOT make attempts to couch/clear them. Also, ANY Cognitive decline can impact overall awareness/timing of swallow and safety during po's which increases risk for aspiration, choking.  Decreased attention to the ice chip bolus given during this assessment noted; and overt  oropharyngeal phase swallowing impairments noted.  Pt's risk for choking and aspiration are TOO HIGH to indicate/recommend a safe oral diet.      Pt consumed 1 trial of a single ice chip w/ MODERATE oral prep and oral phase deficits c/b slow, deliberate bolus management and A-P transfer, oral holding, retracted lingual position, increased oral phase time in general. Given time and tactile/verbal cues,  he transferred the bolus for swallowing. Pt reacted w/ a delayed, congested cough post swallowing. Suspect delayed pharyngeal swallow initiation.   MODERATE visual presentation and tactile cues given throughout session d/t pt's decreased attention w/ tasks; impacted by discomfort. NSG reported pt had recently received Morphine.  Total feeding assistance required.    OM Exam was cursory d/t poor follow through but no unilateral weakness noted; generalized oral weakness w/ lingual retraction noted.    In setting of current Acute illness w/ declined Pulmonary status, declined attention to tasks, Edentulous status, current swallowing presentation (as described above), and risk for aspiration, recommend strict NPO status. MD/NSG/Palliative Care updated.    ST services recommends f/u w/ Palliative Care for [place] and education re: impact of Acute illness/chronic illness on swallowing, dysphagia, meeting nutrition/hydration needs. Recommend frequent oral care for hygiene and stimulation of swallowing. ST services can be reconsulted when pt's medical status and [place] have significantly improved to the point where he can appropriately manage his own secretions and engage in tasks of oral intake safely.  SLP Visit Diagnosis: Dysphagia, oropharyngeal phase (R13.12)    Aspiration Risk  Severe aspiration risk;Risk for inadequate nutrition/hydration    Diet Recommendation   NPO   Medication Administration: Via alternative means    Other  Recommendations Recommended Consults:  (abdominal distention currently) Oral Care Recommendations: Oral care QID;Staff/trained caregiver to provide oral care Other Recommendations:  (TBD)    Recommendations for follow up therapy are one component of a multi-disciplinary discharge planning process, led by the attending physician.  Recommendations may be updated based on patient status, additional functional criteria and insurance authorization.  Follow up  Recommendations Follow physician's recommendations for discharge plan and follow up therapies      Assistance Recommended at Discharge Frequent or constant Supervision/Assistance  Functional Status Assessment Patient has had a recent decline in their functional status and/or demonstrates limited ability to make significant improvements in function in a reasonable and predictable amount of time  Frequency and Duration  (TBD)   (TBD)       Prognosis Prognosis for Safe Diet Advancement: Guarded Barriers to Reach Goals: Cognitive deficits;Language deficits;Time post onset;Severity of deficits      Swallow Study   General Date of Onset: 05/28/22 HPI: Pt is 69 YOM admitted for small bowel obstruction and abdominal distention. PMH includes: CVA w L sided weakness, dCHF, seizures, CAD, HLD, CKD3a, COPD, tobacco abuse, depression, anxiety, and ogilive's syndrome.  Surgery consulted: "abdominal pain, distension, nausea, and emesis thought to be secondary to possible small bowel obstruction also with stable appearance of chronic colonic distension, complicated by significant comorbid disease.".   CT of Abd: Lower chest: Stable small solid pulmonary nodule of the right lower  lobe measuring 5 mm on series 4, image 1. New mild linear nodular  opacity of the left lower lobe. Type of Study: Bedside Swallow Evaluation Previous Swallow Assessment: none Diet Prior to this Study: Thin liquids (clears per MD) Temperature Spikes Noted:  (WBC not elevated) Respiratory Status: Room air History of Recent Intubation: No Behavior/Cognition: [place];Cooperative;Pleasant mood;Confused;Distractible;Requires cueing (discomfort - NSG aware;  Baseline Dysarthria) Oral Cavity Assessment: Dry;Dried secretions Oral Care Completed by SLP: Yes (pt c/o soreness to touch) Oral Cavity - Dentition: Edentulous Vision:  (n/a) Self-Feeding Abilities: Total assist Patient Positioning: Upright in bed (needed full support) Baseline  Vocal Quality: Wet (increased RR and effort w/ abdominal breathing) Volitional Cough: Congested;Wet (Cued; Fair effort - wuold not think fully productive to protect airway) Volitional Swallow: Unable to elicit    Oral/Motor/Sensory Function Overall Oral Motor/Sensory Function: Generalized oral weakness (retracted tongue position; open-mouth posture) Facial Symmetry: Within Functional Limits Lingual Symmetry: Within Functional Limits   Ice Chips Ice chips: Impaired Presentation: Spoon (1 trial) Oral Phase Impairments: Reduced labial seal;Reduced lingual movement/coordination;Poor awareness of bolus Oral Phase Functional Implications: Prolonged oral transit Pharyngeal Phase Impairments: Cough - Delayed Other Comments: no further trials recommended   Thin Liquid   NT   Nectar Thick   NT  Honey Thick   NT  Puree   NT  Solid       NT         Orinda Kenner, MS, CCC-SLP Speech Language Pathologist Rehab Services; [place] [phone] (ascom) Lekeisha Arenas 06/01/2022,3:20 PM

## 2022-06-01 NOTE — Consult Note (Signed)
[place] for IV Heparin Indication: atrial fibrillation  Patient Measurements: Height: '5\' 11"'$  (180.3 cm) Weight: 96 kg (211 lb 10.3 oz) IBW/kg (Calculated) : 75.3 Heparin Dosing Weight: 94.7 kg  Labs: Recent Labs    05/29/22 0742 05/29/22 0824 05/30/22 0259 05/30/22 0449 05/31/22 0342  HGB 14.0 13.9 12.3*  --  13.3  HCT 43.8 44.0 39.0  --  41.6  PLT 192 195 170  --  169  LABPROT 13.5  --   --   --   --   INR 1.0  --   --   --   --   CREATININE 1.26*  --  1.41*  --  1.22  TROPONINIHS  --   --  20* 23*  --     Estimated Creatinine Clearance: 63.8 mL/min (by C-G formula based on SCr of 1.22 mg/dL).   Medical History: Past Medical History:  Diagnosis Date   Alcohol abuse    drinks on weekend   Anemia    Anxiety    Arthritis    Cancer ([facility])    colon,throat   COPD (chronic obstructive pulmonary disease) ([place])    Coronary artery disease    Depression    Gout    Hypertension    Myocardial infarction ([place])    Neuromuscular disorder ([place])    Seizures ([place])    last 6 months ago   Stroke [facility])    multiple  left side weakness   Tremors of nervous system     Medications:  No anticoagulation prior to admission per my chart review  Assessment: Patient is a 71 y/o M with medical history as above who is admitted with SBO in setting of chronic Ogilvie's syndrome. Pharmacy consulted to initiate heparin infusion for atrial fibrillation.   Baseline aPTT and PT-INR are pending. Baseline CBC within normal limits.   Goal of Therapy:  Heparin level 0.3-0.7 units/ml Monitor platelets by anticoagulation protocol: Yes   Plan:  --Heparin 4000 unit IV bolus followed by continuous infusion at 1400 units/hr --Heparin level 8 hours after initiation of infusion --Daily CBC per protocol while on IV heparin  Benita Gutter 06/01/2022,7:32 AM

## 2022-06-01 NOTE — Progress Notes (Signed)
Initial Nutrition Assessment  DOCUMENTATION CODES:   Not applicable  INTERVENTION:   If tube feeds initiated, recommend:   Osmolite 1.5_0 /hr- Initiate at 43m/hr, once tolerating increase by 176mhr q 8 hours until goal rate is reached.   ProSource TF 20- Give 6080maily via tube, each supplement provides 80kcal and 20g of protein.   Free water flushes 18m50m hours to maintain tube patency   Regimen provides 2060kcal/day, 103g/day protein and 1186ml60m of free water.   Pt at high refeed risk; recommend monitor potassium, magnesium and phosphorus labs daily until stable  Boost Breeze po TID, each supplement provides 250 kcal and 9 grams of protein  MVI po daily   NUTRITION DIAGNOSIS:   Inadequate oral intake related to acute illness as evidenced by other (comment) (pt on NPO/clear liquid diet since admission).  GOAL:   Patient will meet greater than or equal to 90% of their needs  MONITOR:   PO intake, Supplement acceptance, Labs, Weight trends, Skin, I & O's  REASON FOR ASSESSMENT:   Rounds    ASSESSMENT:   70 y.51 male with medical history significant of Ogilvie's syndrome, ileus, hypertension, hyperlipidemia, COPD, MI, stroke with mild left-sided weakness, GERD, gout, depression with anxiety, seizure, DDD, CAD, MI,  alcohol abuse, CKD stage IIIa, dCHF, colon cancer and thyroid cancer who is admitted with colonic ileus secondary to Ogilvie syndrome and possible SBO.  Met with pt in room today. Pt is well known to this RD from previous admissions. Pt with good appetite and oral intake at baseline; pt generally eats well in hospital once he starts feeling better and will drink Ensure supplements. Pt required TPN during his last admission but was eating well prior to discharge. Pt has been NPO since admission. Pt initiated on a clear liquid diet today. NGT in place with 18ml 75mut. Plan today is for trial of clear liquid diet; if pt does well, will discontinue NGT and  advance diet as tolerated. If pt does not tolerate clear liquids, will plan to initiate trickle feeds via NGT. Pt is at high refeed risk.   Per chart, pt is weight stable since admission.   Medications reviewed and include: nicotine, protonix, NaCl _1 /hr, zosyn  Labs reviewed: K 3.7 wnl, Mg 2.1 wnl  NUTRITION - FOCUSED PHYSICAL EXAM:  Flowsheet Row Most Recent Value  Orbital Region No depletion  Upper Arm Region No depletion  Thoracic and Lumbar Region No depletion  Buccal Region No depletion  Temple Region No depletion  Clavicle Bone Region No depletion  Clavicle and Acromion Bone Region No depletion  Scapular Bone Region No depletion  Dorsal Hand No depletion  Patellar Region No depletion  Anterior Thigh Region No depletion  Posterior Calf Region No depletion  Edema (RD Assessment) None  Hair Reviewed  Eyes Reviewed  Mouth Reviewed  Skin Reviewed  Nails Reviewed   Diet Order:   Diet Order             Diet clear liquid Room service appropriate? Yes; Fluid consistency: Thin  Diet effective now                  EDUCATION NEEDS:   No education needs have been identified at this time  Skin:  Skin Assessment: Reviewed RN Assessment  Last BM:  9/25  Height:   Ht Readings from Last 1 Encounters:  05/31/22 5' 11" (1.803 m)    Weight:   Wt Readings from Last 1 Encounters:  05/31/22 96 kg  Ideal Body Weight:  78 kg  BMI:  Body mass index is 29.52 kg/m.  Estimated Nutritional Needs:   Kcal:  1900-2200kcal/day  Protein:  95-110g/day  Fluid:  1.9-2.2L/day  Koleen Distance MS, RD, LDN Please refer to [facility] for RD and/or RD on-call/weekend/after hours pager

## 2022-06-01 NOTE — Consult Note (Signed)
NAME:  Calvin [name], MRN:  [id number], DOB:  1951-03-22, LOS: 4 ADMISSION DATE:  05/28/2022, CONSULTATION DATE:  06/01/2022 REFERRING MD:  Dr. Leslye Peer, CHIEF COMPLAINT:  Acute Respiratory Distress   Brief Pt Description / Synopsis:  71 y.o. Male admitted with Ogilvie's Syndrome and paroxsymal atrial flutter.  Course complicated Acute Hypoxic Respiratory Failure due to aspiration pneumonia.  High risk for intubation.  GI and General Surgery following.  History of Present Illness:  Calvin [name] is a 71 y.o. male with medical history significant of Ogilvie's syndrome, ileus, hypertension, hyperlipidemia, COPD, stroke with mild left-sided weakness, GERD, gout, depression with anxiety, seizure, CAD, MI,  alcohol abuse, CKD stage IIIa, dCHF, colon cancer, thyroid cancer, who presented to [facility] ED on 05/28/22 from his facility with Nausea, vomiting, abdominal distention, and abdominal pain for 2 days.  Found to have new small bowel obstruction on CT abdomen with transition point in the right lower abdomen suspicious for adhesion.  Hospitalists were asked to admit the patient. General surgery was consulted and NG tube was placed.   Patient also developed left-sided tingling and numbness and facial droop while in ED, has an history of left-sided stroke with residual weakness.  Symptoms resolved within few minutes. CT head and MRI brain was negative for any acute intracranial abnormality and do reflect a chronic changes. 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 Neurology was consulted-patient to complete stroke work-up.  Please see "significant hospital events" section below for full detailed hospital course.   Pertinent  Medical History   Past Medical History:  Diagnosis Date   Alcohol abuse    drinks on weekend   Anemia    Anxiety    Arthritis    Cancer ([place])    colon,throat   COPD (chronic obstructive pulmonary disease) ([place])    Coronary artery disease    Depression    Gout     Hypertension    Myocardial infarction [facility])    Neuromuscular disorder ([place])    Seizures ([place])    last 6 months ago   Stroke [facility])    multiple  left side weakness   Tremors of nervous system     Micro Data:  9/24: SARS-CoV-2 and influenza PCR>> negative 9/27: MRSA PCR>> negative  Antimicrobials:  Zosyn 9/28>>  Significant Hospital Events: Including procedures, antibiotic start and stop dates in addition to other pertinent events   9/24: Admission.  General Surgery consulted. 9/25: Labs with mildly elevated creatinine at 1.26 with baseline seems to be around 1.1-1.2.  Lipid panel normal with LDL of 57. CBC with mild leukocytosis at 11.1.  Hemoglobin stable,A1c 5.8 Repeat abdominal x-ray with stable obstructive findings and no other acute abnormality.  Continued to have significant colonic distention.  Passing flatus, minimum secretions with NG tube. general surgery would like to continue with conservative management with NG tube. Speech and cognition appears to be at baseline per speech evaluation. Patient uses quite a bit of opioids for chronic pain which can be contributory.  Try avoiding opioids if possible. We can try a Toradol as needed for pain with a close monitoring of renal function.  9/26: Patient overnight complained about chest pain, on evaluation by night cross coverage, he was complaining more of generalized aches and pain but did responded to nitroglycerin x1.  EKG with sinus tachycardia, troponin 20>> 23. Patient was given a dose of morphine. Renal function with slight worsening of creatinine to 1.41 on 05/30/2022.  We will avoid Toradol and pain  will be managed with morphine although can contribute to his slower bowel function. Repeat abdominal x-ray with air-filled distention of large and small bowel loops favoring ileus. 9/27: transferred to the stepdown unit for neostigmine dosing.  Concern for possible aspiration 9/28: CT did not show any signs of obstruction, small  bowel last dilated.  Still had dilation of the colon.  Worsening respiratory status.  High risk for intubation.  PCCM consulted  Interim History / Subjective:  Patient with worsening respiratory status today, PCCM asked to consult due to high risk for intubation -Dr. Bobbye Charleston reports patient had aspiration episode yesterday, has been placed on Zosyn -On exam with stridor ~we will give IV Decadron and racemic epi (already on Pulmicort nebs and bronchodilators)   Objective   Blood pressure (!) 174/107, pulse (!) 109, temperature 98.2 F (36.8 C), temperature source Oral, resp. rate (!) 33, height '5\' 11"'$  (1.803 m), weight 96 kg, SpO2 98 %.        Intake/Output Summary (Last 24 hours) at 06/01/2022 1607 Last data filed at 06/01/2022 0800 Gross per 24 hour  Intake 948.24 ml  Output 381 ml  Net 567.24 ml   Filed Weights   05/29/22 1200 05/31/22 1540  Weight: 95.1 kg 96 kg    Examination: General: Acute on chronically ill-appearing male, sitting in bed, on nasal cannula, with moderate respiratory distress HENT: Atraumatic, normocephalic, stridor noted Lungs: Coarse breath sounds bilaterally, mild tachypnea and accessory muscle use Cardiovascular: Tachycardia, irregular rhythm, no murmurs, rubs, gallops Abdomen: Distended and taut, tender to palpation, no guarding or rebound tenderness Extremities: No edema and good peripheral perfusion, 2+ pulses Neuro: Awake and alert, difficult to assess orientation GU: Deferred  Resolved Hospital Problem list     Assessment & Plan:   Acute Hypoxic Respiratory Failure due to Aspiration Pneumonia  PMHx: COPD, HFpEF -Supplemental O2 as needed to maintain O2 sats 88 to 92% -Avoid BiPAP given AMS and abdominal distention ~ High risk for intubation -Follow intermittent Chest X-ray & ABG as needed -Bronchodilators & Pulmicort nebs ~ will give dose of Racemic Epi & Decadron x1 for stridor 9/28 -IV Steroids -ABX as above -Diuresis as BP and renal  function permits -Pulmonary toilet as able  Ogilvie's Syndrome -Follow abdominal exam, serial KUB's -NG Tube ~ trial clamping today 9/28 per Surgery with trickle feeds -Pain control -General Surgery and GI following, appreciate input -S/p Neostigmine x2 -Tentative plan for Colonic decompression 9/29  Paroxsymal Atrial Flutter Chronic HFpEF Hypertension Echocardiogram 05/31/22 with LVEF 50-55%, indeterminate Diastolic parameters, RV function normal -Continuous cardiac monitoring -Maintain MAP >65 -Diuresis as BP and renal function permits -Continue Heparin gtt for anticoagulation -Troponin negative x2  Acute Metabolic Encephalopathy PMHx: Seizures, stroke, Cervical spinal stenosis -Provide supportive care -Avoid sedating meds as able -Seizure and aspiration precautions -Continue Keppra and Valproate MRI showing multilevel cervical spondylosis most pronounced at C3-C4 where moderate to severe canal stenosis and severe bilateral foraminal stenosis.  Patient is not a great surgical candidate at this point.   Pt is critically ill.  Respiratory status is tenuous.  High risk for decompensation, cardiac arrest and death.  Palliative Care has been consulted for goals of care conversations.    Best Practice (right click and "Reselect all SmartList Selections" daily)   Diet/type: tubefeeds and NPO DVT prophylaxis: SCD GI prophylaxis: PPI Lines: N/A Foley:  N/A Code Status:  full code Last date of multidisciplinary goals of care discussion [N/A]  Labs   CBC: Recent Labs  Lab 05/28/22  1208 05/28/22 1817 05/29/22 0742 05/29/22 0824 05/30/22 0259 05/31/22 0342  WBC 9.8 9.6 11.1* 10.4 8.0 7.3  NEUTROABS 8.5*  --   --   --   --   --   HGB 14.9 14.2 14.0 13.9 12.3* 13.3  HCT 47.0 45.6 43.8 44.0 39.0 41.6  MCV 95.5 96.0 95.0 94.6 94.7 94.3  PLT 206 210 192 195 170 976    Basic Metabolic Panel: Recent Labs  Lab 05/28/22 1208 05/28/22 1212 05/29/22 0742 05/30/22 0259  05/31/22 0342  NA 140  --  141 141 144  K 4.6  --  4.3 3.7 3.7  CL 107  --  108 111 110  CO2 22  --  26 24 21*  GLUCOSE 122*  --  112* 80 80  BUN 13  --  '13 18 18  '$ CREATININE 1.18  --  1.26* 1.41* 1.22  CALCIUM 9.2  --  9.1 8.4* 8.9  MG  --  2.3  --  2.2 2.1   GFR: Estimated Creatinine Clearance: 63.8 mL/min (by C-G formula based on SCr of 1.22 mg/dL). Recent Labs  Lab 05/28/22 1215 05/28/22 1817 05/29/22 0742 05/29/22 0824 05/30/22 0259 05/31/22 0342  WBC  --    < > 11.1* 10.4 8.0 7.3  LATICACIDVEN 1.2  --   --   --   --   --    < > = values in this interval not displayed.    Liver Function Tests: Recent Labs  Lab 05/28/22 1208 05/29/22 0742  AST 28 18  ALT 17 13  ALKPHOS 64 61  BILITOT 1.0 0.8  PROT 7.8 7.3  ALBUMIN 4.2 4.0   Recent Labs  Lab 05/28/22 1208  LIPASE 30   No results for input(s): "AMMONIA" in the last 168 hours.  ABG    Component Value Date/Time   PHART 7.43 08/06/2019 1634   PCO2ART 38 08/06/2019 1634   PO2ART 66 (L) 08/06/2019 1634   HCO3 25.2 08/06/2019 1634   O2SAT 93.3 08/06/2019 1634     Coagulation Profile: Recent Labs  Lab 05/28/22 1208 05/29/22 0742 06/01/22 0740  INR 1.0 1.0 1.2    Cardiac Enzymes: No results for input(s): "CKTOTAL", "CKMB", "CKMBINDEX", "TROPONINI" in the last 168 hours.  HbA1C: Hgb A1c MFr Bld  Date/Time Value Ref Range Status  05/28/2022 06:17 PM 5.8 (H) 4.8 - 5.6 % Final    Comment:    (NOTE) Pre diabetes:          5.7%-6.4%  Diabetes:              >6.4%  Glycemic control for   <7.0% adults with diabetes   08/09/2019 07:31 AM 5.7 (H) 4.8 - 5.6 % Final    Comment:    (NOTE) Pre diabetes:          5.7%-6.4% Diabetes:              >6.4% Glycemic control for   <7.0% adults with diabetes     CBG: Recent Labs  Lab 05/28/22 1503 05/31/22 1539  GLUCAP 99 99    Review of Systems:   Positives in BOLD: Gen: Denies fever, chills, weight change, fatigue, night sweats HEENT: Denies  blurred vision, double vision, hearing loss, tinnitus, sinus congestion, rhinorrhea, sore throat, neck stiffness, dysphagia PULM: Denies shortness of breath, cough, sputum production, hemoptysis, wheezing CV: Denies chest pain, edema, orthopnea, paroxysmal nocturnal dyspnea, palpitations GI: Denies abdominal pain, nausea, vomiting, diarrhea, hematochezia, melena, constipation, change in bowel  habits GU: Denies dysuria, hematuria, polyuria, oliguria, urethral discharge Endocrine: Denies hot or cold intolerance, polyuria, polyphagia or appetite change Derm: Denies rash, dry skin, scaling or peeling skin change Heme: Denies easy bruising, bleeding, bleeding gums Neuro: Denies headache, numbness, weakness, slurred speech, loss of memory or consciousness   Past Medical History:  He,  has a past medical history of Alcohol abuse, Anemia, Anxiety, Arthritis, Cancer ([place]), COPD (chronic obstructive pulmonary disease) ([place]), Coronary artery disease, Depression, Gout, Hypertension, Myocardial infarction ([place]), Neuromuscular disorder ([place]), Seizures ([place]), Stroke ([place]), and Tremors of nervous system.   Surgical History:   Past Surgical History:  Procedure Laterality Date   CARPAL TUNNEL RELEASE Left 10/19/2015   Procedure: CARPAL TUNNEL RELEASE;  Surgeon: Hessie Knows, MD;  Location: [facility] ORS;  Service: Orthopedics;  Laterality: Left;   COLON SURGERY     COLONOSCOPY WITH PROPOFOL N/A 10/26/2021   Procedure: COLONOSCOPY WITH PROPOFOL;  Surgeon: Lesly Rubenstein, MD;  Location: [facility] ENDOSCOPY;  Service: Endoscopy;  Laterality: N/A;   JOINT REPLACEMENT     left partial hip    THROAT SURGERY  2013   cancer     Social History:   reports that he has been smoking. He has never used smokeless tobacco. He reports current alcohol use. He reports that he does not use drugs.   Family History:  His family history includes Cancer in his mother; Hypertension in his father; Leukemia in his brother.    Allergies No Known Allergies   Home Medications  Prior to Admission medications   Medication Sig Start Date End Date Taking? Authorizing Provider  acetaminophen (TYLENOL) 325 MG tablet Take 650 mg by mouth every 6 (six) hours.   Yes [provider]  acidophilus (RISAQUAD) CAPS capsule Take 1 capsule by mouth daily.   Yes [provider]  ALPRAZolam (XANAX) 0.25 MG tablet Take 0.25 mg by mouth daily.   Yes [provider]  ALPRAZolam Duanne Moron) 0.5 MG tablet Take 0.5 mg by mouth 2 (two) times daily.   Yes [provider]  aspirin 81 MG chewable tablet Take 81 mg by mouth daily.   Yes [provider]  bisacodyl (DULCOLAX) 10 MG suppository Place 10 mg rectally in the morning.   Yes [provider]  bisacodyl (DULCOLAX) 5 MG EC tablet Take 5 mg by mouth daily.   Yes [provider]  Calcium Carbonate-Vit D-Min (CALCIUM 600+D PLUS MINERALS) 600-400 MG-UNIT TABS Take 1 tablet by mouth 2 (two) times daily.   Yes [provider]  furosemide (LASIX) 20 MG tablet Take 1 tablet (20 mg total) by mouth daily. 11/02/21  Yes Wouk, Ailene Rud, MD  gabapentin (NEURONTIN) 100 MG capsule Take 100 mg by mouth in the morning. (Take only with morning '800mg'$  dose to equal '900mg'$  total)   Yes [provider]  gabapentin (NEURONTIN) 400 MG capsule Take 2 capsules (800 mg total) by mouth 3 (three) times daily. 10/02/17  Yes Max Sane, MD  guaiFENesin (MUCINEX) 600 MG 12 hr tablet Take 600 mg by mouth 2 (two) times daily.   Yes [provider]  HYDROcodone-acetaminophen (NORCO/VICODIN) 5-325 MG tablet Take 0.5 tablets by mouth every 12 (twelve) hours. 11/02/21  Yes Wouk, Ailene Rud, MD  lactulose [facility]) 10 GM/15ML solution Take 20 g by mouth daily.   Yes [provider]  levETIRAcetam (KEPPRA) 100 MG/ML solution Take 1,000 mg by mouth 2 (two) times daily.   Yes [provider]  Lidocaine HCl 4 % CREA  Apply 1  application topically daily. Apply from left side of face, down to left side of body to left foot   Yes [provider]  lisinopril (ZESTRIL) 5 MG tablet Take 1 tablet (5 mg total) by mouth daily. 08/14/19  Yes Dhungel, Nishant, MD  melatonin 3 MG TABS tablet Take 6 mg by mouth at bedtime.   Yes [provider]  metoCLOPramide (REGLAN) 5 MG tablet Take 5 mg by mouth 4 (four) times daily.   Yes [provider]  metoprolol succinate (TOPROL-XL) 25 MG 24 hr tablet Take 25 mg by mouth daily.   Yes [provider]  nystatin (MYCOSTATIN/NYSTOP) powder Apply 1 application topically 2 (two) times daily as needed (groin rash).   Yes [provider]  Omega-3 1000 MG CAPS Take 1,000 mg by mouth daily.   Yes [provider]  pantoprazole (PROTONIX) 20 MG tablet Take 20 mg by mouth in the morning.   Yes [provider]  polyethylene glycol (MIRALAX / GLYCOLAX) 17 g packet Take 34 g by mouth daily. 11/02/21  Yes Wouk, Ailene Rud, MD  promethazine [facility]) 25 MG/ML injection Inject 25 mg into the vein every 4 (four) hours as needed for nausea or vomiting.   Yes [provider]  senna (SENOKOT) 8.6 MG TABS tablet Take 1 tablet (8.6 mg total) by mouth at bedtime. Patient taking differently: Take 1 tablet by mouth every evening. 09/20/19  Yes Veronese, [place], MD  sertraline (ZOLOFT) 50 MG tablet Take 50 mg by mouth daily.   Yes [provider]  simvastatin (ZOCOR) 10 MG tablet Take 10 mg by mouth at bedtime.    Yes [provider]  tamsulosin (FLOMAX) 0.4 MG CAPS capsule Take 0.4 mg by mouth every evening.   Yes [provider]  tiotropium (SPIRIVA) 18 MCG inhalation capsule Place 18 mcg into inhaler and inhale daily.   Yes [provider]  Wheat Dextrin (BENEFIBER DRINK MIX PO) Take 5 g by mouth daily.   Yes [provider]  Zinc Oxide 25 % PSTE Apply 1 Application topically 3 (three) times daily  as needed (peri area).   Yes [provider]  alum & mag hydroxide-simeth (MAALOX/MYLANTA) 200-200-20 MG/5ML suspension Take 30 mLs by mouth every 12 (twelve) hours as needed for indigestion or heartburn.    [provider]  B Complex Vitamins (VITAMIN B-COMPLEX) TABS Take 1 tablet by mouth daily. Patient not taking: Reported on 05/28/2022 06/09/21   [provider]  B Complex-C (B-COMPLEX WITH VITAMIN C) tablet Take 1 tablet by mouth daily. Patient not taking: Reported on 05/28/2022    [provider]  clindamycin-benzoyl peroxide (BENZACLIN) gel Apply 1 application topically every morning. Patient not taking: Reported on 05/28/2022 10/03/21   [provider]  Dextromethorphan-Benzocaine (CEPACOL SORE THROAT & COUGH) 5-7.5 MG LOZG Use as directed 1 lozenge in the mouth or throat every 3 (three) hours as needed (sore throat).    [provider]  levETIRAcetam (KEPPRA) 500 MG tablet Take 1,000 mg by mouth 2 (two) times daily. Patient not taking: Reported on 05/28/2022    [provider]  Lidocaine 4 % PTCH Apply 1 patch topically at bedtime. Apply to left side of neck daily at bedtime for 5 days Patient not taking: Reported on 05/28/2022    [provider]  Menthol, Topical Analgesic, (BIOFREEZE) 4 % GEL Apply 1 application topically daily. (Apply to left side of face and body)    [provider]  nitroGLYCERIN (NITROSTAT) 0.4 MG SL tablet Place 0.4 mg under the tongue every 5 (five) minutes as needed for chest pain.    [provider]  ondansetron (ZOFRAN-ODT) 4 MG disintegrating tablet Take 4 mg by mouth every 8 (eight) hours as needed for nausea or vomiting.    [provider]     Critical care time: 50 minutes     Darel Hong, AGACNP-BC [facility] [place] epic messenger for cross cover needs If after hours, please call E-link

## 2022-06-01 NOTE — Progress Notes (Signed)
Call back to see me patient with more respiratory issues.  Patient having a lot of upper respiratory congestion.  Patient has been suctioning quite a bit today.  Did not do well with speech therapy today.  Case discussed with critical care team.  We will start racemic epinephrine and Decadron.  Chest x-ray ordered.  VBG ordered by critical care team.  Continue to watch respiratory status closely.  Patient full code.  Palliative care consulted.  Dr. Loletha Grayer

## 2022-06-01 NOTE — Assessment & Plan Note (Addendum)
Continue 5 days of Zosyn

## 2022-06-01 NOTE — Progress Notes (Signed)
GI Inpatient Follow-up Note  Subjective:  Patient seen and unable to respond to questions.  Scheduled Inpatient Medications:   budesonide (PULMICORT) nebulizer solution  0.25 mg Nebulization BID   Chlorhexidine Gluconate Cloth  6 each Topical Q0600   feeding supplement  1 Container Oral TID BM   ipratropium-albuterol  3 mL Nebulization TID   lidocaine HCl (PF)  10 mL Other Once   lisinopril  10 mg Oral Daily   LORazepam  1 mg Intravenous QHS   metoprolol succinate  25 mg Oral Daily   nicotine  21 mg Transdermal Daily   pantoprazole (PROTONIX) IV  40 mg Intravenous Q12H    Continuous Inpatient Infusions:    sodium chloride 75 mL/hr at 06/01/22 0600   heparin 1,400 Units/hr (06/01/22 0946)   levETIRAcetam 1,000 mg (06/01/22 0918)   piperacillin-tazobactam (ZOSYN)  IV     valproate sodium 250 mg (06/01/22 1123)    PRN Inpatient Medications:  acetaminophen **OR** acetaminophen (TYLENOL) oral liquid 160 mg/5 mL **OR** acetaminophen, albuterol, hydrALAZINE, lidocaine, LORazepam, LORazepam, metoprolol tartrate, morphine injection, nitroGLYCERIN, ondansetron (ZOFRAN) IV, phenol, senna-docusate, trolamine salicylate  Review of Systems:  Unable to assess due to mental status   Physical Examination: BP (!) 168/107   Pulse (!) 103   Temp 98.2 F (36.8 C) (Oral)   Resp 18   Ht '5\' 11"'$  (1.803 m)   Wt 96 kg   SpO2 92%   BMI 29.52 kg/m  Gen: not oriented HEENT: PEERLA, EOMI, Neck: supple, no JVD or thyromegaly Chest: coarse breath sounds Abd: distended but softer than yesterday Ext: no edema, well perfused with 2+ pulses, Skin: no rash or lesions noted Lymph: no LAD  Data: Lab Results  Component Value Date   WBC 7.3 05/31/2022   HGB 13.3 05/31/2022   HCT 41.6 05/31/2022   MCV 94.3 05/31/2022   PLT 169 05/31/2022   Recent Labs  Lab 05/29/22 0824 05/30/22 0259 05/31/22 0342  HGB 13.9 12.3* 13.3   Lab Results  Component Value Date   NA 144 05/31/2022   K 3.7  05/31/2022   CL 110 05/31/2022   CO2 21 (L) 05/31/2022   BUN 18 05/31/2022   CREATININE 1.22 05/31/2022   Lab Results  Component Value Date   ALT 13 05/29/2022   AST 18 05/29/2022   ALKPHOS 61 05/29/2022   BILITOT 0.8 05/29/2022   Recent Labs  Lab 06/01/22 0740  APTT 35  INR 1.2   Assessment/Plan: Mr. Barnett is a 71 y.o. gentleman with history of chronic ogilvie's syndrome, COPD, hypertension, hyperlipidemia, and multiple other medical comorbidities here with resolved SBO and now worsening pseudoobstruction we have been consulted for ogilvie's syndrome. Abdomen is still distended but compared to yesterday it's not as firm. Trialed increased dose of neostigmine today but no results.  Recommendations:  - patient has failed IV methylnaltrexone and neostigmine x 2. He is not a great surgical candidate so will attempt colonic decompression tomorrow. This was done in February so given chronicity in symptoms I think palliative care discussions is appropriate at this time. I spoke with the patient's daughter and discussed the risks of the procedure which includes perforation but no other options remain at this time - IV hydration and replete electrolytes aggressively - avoid narcotics at ALL cost    Will continue to follow, please call with any questions or concerns.  Raylene Miyamoto MD, MPH [place]

## 2022-06-01 NOTE — Consult Note (Signed)
Pharmacy Antibiotic Note  Calvin [name] is a 71 y.o. male with PMH including Ogilvie's syndrome, HTN, HLD, COPD, CVA, GERD, gout, depression / anxiety, seizures, CAD / MI, alcohol use disorder, CKD, diastolic CHF, colon cancer, thyroid cancer admitted on 05/28/2022 with  SBO. Now with concerns for aspiration pneumonia .  Pharmacy has been consulted for Zosyn dosing.  Plan: Zosyn 3.375g IV q8h (4 hour infusion).  Height: '5\' 11"'$  (180.3 cm) Weight: 96 kg (211 lb 10.3 oz) IBW/kg (Calculated) : 75.3  Temp (24hrs), Avg:98.5 F (36.9 C), Min:98.2 F (36.8 C), Max:99.2 F (37.3 C)  Recent Labs  Lab 05/28/22 1208 05/28/22 1215 05/28/22 1817 05/29/22 0742 05/29/22 0824 05/30/22 0259 05/31/22 0342  WBC 9.8  --  9.6 11.1* 10.4 8.0 7.3  CREATININE 1.18  --   --  1.26*  --  1.41* 1.22  LATICACIDVEN  --  1.2  --   --   --   --   --     Estimated Creatinine Clearance: 63.8 mL/min (by C-G formula based on SCr of 1.22 mg/dL).    No Known Allergies  Antimicrobials this admission: Zosyn 9/28 >>   Dose adjustments this admission: N/A  Microbiology results: 9/27 MRSA PCR: (-)  Thank you for allowing pharmacy to be a part of this patient's care.  Benita Gutter 06/01/2022 12:18 PM

## 2022-06-02 ENCOUNTER — Inpatient Hospital Stay: Payer: Medicare Other

## 2022-06-02 ENCOUNTER — Encounter
Admission: EM | Disposition: A | Discharge status: Skilled Nursing Facility | Payer: Self-pay | Source: Skilled Nursing Facility | Attending: Internal Medicine

## 2022-06-02 ENCOUNTER — Inpatient Hospital Stay: Payer: Medicare Other | Admitting: Anesthesiology

## 2022-06-02 ENCOUNTER — Encounter: Payer: Self-pay | Admitting: Internal Medicine

## 2022-06-02 DIAGNOSIS — G629 Polyneuropathy, unspecified: Secondary | ICD-10-CM

## 2022-06-02 DIAGNOSIS — Z515 Encounter for palliative care: Secondary | ICD-10-CM | POA: Diagnosis not present

## 2022-06-02 DIAGNOSIS — K56609 Unspecified intestinal obstruction, unspecified as to partial versus complete obstruction: Secondary | ICD-10-CM | POA: Diagnosis not present

## 2022-06-02 DIAGNOSIS — K5981 Ogilvie syndrome: Secondary | ICD-10-CM | POA: Diagnosis not present

## 2022-06-02 DIAGNOSIS — M4802 Spinal stenosis, cervical region: Secondary | ICD-10-CM | POA: Diagnosis not present

## 2022-06-02 DIAGNOSIS — R0603 Acute respiratory distress: Secondary | ICD-10-CM

## 2022-06-02 DIAGNOSIS — Z7189 Other specified counseling: Secondary | ICD-10-CM | POA: Diagnosis not present

## 2022-06-02 DIAGNOSIS — I4892 Unspecified atrial flutter: Secondary | ICD-10-CM | POA: Diagnosis not present

## 2022-06-02 DIAGNOSIS — J690 Pneumonitis due to inhalation of food and vomit: Secondary | ICD-10-CM | POA: Diagnosis not present

## 2022-06-02 HISTORY — PX: COLONOSCOPY WITH PROPOFOL: SHX5780

## 2022-06-02 LAB — BASIC METABOLIC PANEL
Anion gap: 14 (ref 5–15)
BUN: 15 mg/dL (ref 8–23)
CO2: 18 mmol/L — ABNORMAL LOW (ref 22–32)
Calcium: 8.7 mg/dL — ABNORMAL LOW (ref 8.9–10.3)
Chloride: 110 mmol/L (ref 98–111)
Creatinine, Ser: 1.27 mg/dL — ABNORMAL HIGH (ref 0.61–1.24)
GFR, Estimated: 60 mL/min — ABNORMAL LOW (ref >60)
Glucose, Bld: 158 mg/dL — ABNORMAL HIGH (ref 70–99)
Potassium: 3.6 mmol/L (ref 3.5–5.1)
Sodium: 142 mmol/L (ref 135–145)

## 2022-06-02 LAB — CBC
HCT: 44.8 % (ref 39.0–52.0)
Hemoglobin: 14.5 g/dL (ref 13.0–17.0)
MCH: 30.3 pg (ref 26.0–34.0)
MCHC: 32.4 g/dL (ref 30.0–36.0)
MCV: 93.7 fL (ref 80.0–100.0)
Platelets: 179 10*3/uL (ref 150–400)
RBC: 4.78 MIL/uL (ref 4.22–5.81)
RDW: 12.3 % (ref 11.5–15.5)
WBC: 8.2 10*3/uL (ref 4.0–10.5)
nRBC: 0 % (ref 0.0–0.2)

## 2022-06-02 LAB — MAGNESIUM: Magnesium: 2.4 mg/dL (ref 1.7–2.4)

## 2022-06-02 LAB — PHOSPHORUS: Phosphorus: 3.8 mg/dL (ref 2.5–4.6)

## 2022-06-02 LAB — HEPARIN LEVEL (UNFRACTIONATED): Heparin Unfractionated: 0.91 IU/mL — ABNORMAL HIGH (ref 0.30–0.70)

## 2022-06-02 SURGERY — COLONOSCOPY WITH PROPOFOL
Anesthesia: Monitor Anesthesia Care

## 2022-06-02 MED ORDER — LIDOCAINE HCL (PF) 2 % IJ SOLN
INTRAMUSCULAR | Status: AC
  Filled 2022-06-02: qty 5

## 2022-06-02 MED ORDER — FREE WATER
30.0000 mL | Status: DC
  Administered 2022-06-02 – 2022-06-07 (×19): 30 mL

## 2022-06-02 MED ORDER — PROPOFOL 10 MG/ML IV BOLUS
INTRAVENOUS | Status: DC | PRN
  Administered 2022-06-02: 25 mg via INTRAVENOUS
  Administered 2022-06-02: 10 mg via INTRAVENOUS
  Administered 2022-06-02 (×2): 20 mg via INTRAVENOUS

## 2022-06-02 MED ORDER — POLYETHYLENE GLYCOL 3350 17 G PO PACK
17.0000 g | PACK | Freq: Two times a day (BID) | ORAL | Status: DC
  Administered 2022-06-02 – 2022-06-15 (×14): 17 g
  Filled 2022-06-02 (×19): qty 1

## 2022-06-02 MED ORDER — HEPARIN (PORCINE) 25000 UT/250ML-% IV SOLN
1100.0000 [IU]/h | INTRAVENOUS | Status: DC
  Administered 2022-06-02: 1200 [IU]/h via INTRAVENOUS
  Administered 2022-06-03 – 2022-06-05 (×3): 1100 [IU]/h via INTRAVENOUS
  Filled 2022-06-02 (×5): qty 250

## 2022-06-02 MED ORDER — OSMOLITE 1.5 CAL PO LIQD
1000.0000 mL | ORAL | Status: DC
  Administered 2022-06-02: 1000 mL

## 2022-06-02 MED ORDER — PROSOURCE TF20 ENFIT COMPATIBL EN LIQD
60.0000 mL | Freq: Every day | ENTERAL | Status: DC
  Administered 2022-06-03 – 2022-06-04 (×2): 60 mL
  Filled 2022-06-02 (×2): qty 60

## 2022-06-02 MED ORDER — ADULT MULTIVITAMIN LIQUID CH
15.0000 mL | Freq: Every day | ORAL | Status: DC
  Administered 2022-06-03 – 2022-06-06 (×4): 15 mL
  Filled 2022-06-02 (×10): qty 15

## 2022-06-02 MED ORDER — PROPOFOL 1000 MG/100ML IV EMUL
INTRAVENOUS | Status: AC
  Filled 2022-06-02: qty 100

## 2022-06-02 MED ORDER — PROPOFOL 500 MG/50ML IV EMUL
INTRAVENOUS | Status: DC | PRN
  Administered 2022-06-02: 30 ug/kg/min via INTRAVENOUS

## 2022-06-02 MED ORDER — LIDOCAINE HCL (CARDIAC) PF 100 MG/5ML IV SOSY
PREFILLED_SYRINGE | INTRAVENOUS | Status: DC | PRN
  Administered 2022-06-02: 30 mg via INTRAVENOUS

## 2022-06-02 MED ORDER — SODIUM CHLORIDE 0.9 % IV SOLN: INTRAVENOUS | Status: DC

## 2022-06-02 NOTE — Progress Notes (Addendum)
NAME:  Calvin [name], MRN:  [id number], DOB:  06-Aug-1951, LOS: 5 ADMISSION DATE:  05/28/2022, CONSULTATION DATE:  06/01/2022 REFERRING MD:  Dr. Leslye Peer, CHIEF COMPLAINT:  Acute Respiratory Distress   Brief Pt Description / Synopsis:  71 y.o. Male admitted with Ogilvie's Syndrome and paroxsymal atrial flutter.  Course complicated Acute Hypoxic Respiratory Failure due to aspiration pneumonia.  High risk for intubation.  GI and General Surgery following.  History of Present Illness:  Calvin [name] is a 71 y.o. male with medical history significant of Ogilvie's syndrome, ileus, hypertension, hyperlipidemia, COPD, stroke with mild left-sided weakness, GERD, gout, depression with anxiety, seizure, CAD, MI,  alcohol abuse, CKD stage IIIa, dCHF, colon cancer, thyroid cancer, who presented to [facility] ED on 05/28/22 from his facility with Nausea, vomiting, abdominal distention, and abdominal pain for 2 days.  Found to have new small bowel obstruction on CT abdomen with transition point in the right lower abdomen suspicious for adhesion.  Hospitalists were asked to admit the patient. General surgery was consulted and NG tube was placed.   Patient also developed left-sided tingling and numbness and facial droop while in ED, has an history of left-sided stroke with residual weakness.  Symptoms resolved within few minutes. CT head and MRI brain was negative for any acute intracranial abnormality and do reflect a chronic changes. 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 Neurology was consulted-patient to complete stroke work-up.  Please see "significant hospital events" section below for full detailed hospital course.   Pertinent  Medical History   Past Medical History:  Diagnosis Date   Alcohol abuse    drinks on weekend   Anemia    Anxiety    Arthritis    Cancer ([place])    colon,throat   COPD (chronic obstructive pulmonary disease) ([place])    Coronary artery disease    Depression    Gout     Hypertension    Myocardial infarction [facility])    Neuromuscular disorder ([place])    Seizures ([place])    last 6 months ago   Stroke [facility])    multiple  left side weakness   Tremors of nervous system     Micro Data:  9/24: SARS-CoV-2 and influenza PCR>> negative 9/27: MRSA PCR>> negative  Antimicrobials:  Zosyn 9/28>>  Significant Hospital Events: Including procedures, antibiotic start and stop dates in addition to other pertinent events   9/24: Admission.  General Surgery consulted. 9/25: Labs with mildly elevated creatinine at 1.26 with baseline seems to be around 1.1-1.2.  Lipid panel normal with LDL of 57. CBC with mild leukocytosis at 11.1.  Hemoglobin stable,A1c 5.8 Repeat abdominal x-ray with stable obstructive findings and no other acute abnormality.  Continued to have significant colonic distention.  Passing flatus, minimum secretions with NG tube. general surgery would like to continue with conservative management with NG tube. Speech and cognition appears to be at baseline per speech evaluation. Patient uses quite a bit of opioids for chronic pain which can be contributory.  Try avoiding opioids if possible. We can try a Toradol as needed for pain with a close monitoring of renal function.  9/26: Patient overnight complained about chest pain, on evaluation by night cross coverage, he was complaining more of generalized aches and pain but did responded to nitroglycerin x1.  EKG with sinus tachycardia, troponin 20>> 23. Patient was given a dose of morphine. Renal function with slight worsening of creatinine to 1.41 on 05/30/2022.  We will avoid Toradol and pain  will be managed with morphine although can contribute to his slower bowel function. Repeat abdominal x-ray with air-filled distention of large and small bowel loops favoring ileus. 9/27: transferred to the stepdown unit for neostigmine dosing.  Concern for possible aspiration 9/28: CT did not show any signs of obstruction, small  bowel last dilated.  Still had dilation of the colon.  Worsening respiratory status.  High risk for intubation.  PCCM consulted 9/29: Respiratory status much improved, no stridor.  Nursing reports BM overnight.  To go for colonic decompression today with GI.  PCCM will sign off  Interim History / Subjective:  -Respiratory status much improved this morning, stridor resolved following 3 doses of Decadron and Racemic epi -Pain more controlled following restarting home Gabapentin, pt resting comfortably currently -Nursing reports BM last night -Given improvement in respiratory status, PCCM will sign off at this time  Objective   Blood pressure 100/73, pulse 72, temperature 98.4 F (36.9 C), temperature source Axillary, resp. rate 20, height '5\' 11"'$  (1.803 m), weight 96 kg, SpO2 96 %.        Intake/Output Summary (Last 24 hours) at 06/02/2022 0758 Last data filed at 06/02/2022 0600 Gross per 24 hour  Intake 2481.68 ml  Output 830 ml  Net 1651.68 ml    Filed Weights   05/29/22 1200 05/31/22 1540  Weight: 95.1 kg 96 kg    Examination: General: Acute on chronically ill-appearing male, sitting in bed, on nasal cannula, no acute distress HENT: Atraumatic, normocephalic, neck supple Lungs: Coarse breath sounds bilaterally, even, nonlabored Cardiovascular: Tachycardia, irregular rhythm, no murmurs, rubs, gallops Abdomen: Distended and taut, tender to palpation, no guarding or rebound tenderness Extremities: No edema and good peripheral perfusion, 2+ pulses Neuro: Awake and alert, difficult to assess orientation GU: Deferred  Resolved Hospital Problem list     Assessment & Plan:   Acute Hypoxic Respiratory Failure due to Aspiration Pneumonia ~ IMPROVED PMHx: COPD, HFpEF -Supplemental O2 as needed to maintain O2 sats 88 to 92% -Avoid BiPAP given AMS and abdominal distention  -Follow intermittent Chest X-ray & ABG as needed -Bronchodilators & Pulmicort nebs ~ will give dose of Racemic  Epi & Decadron x1 for stridor 9/28 -IV Steroids -ABX as above -Diuresis as BP and renal function permits -Pulmonary toilet as able  Ogilvie's Syndrome -Follow abdominal exam, serial KUB's -NG Tube ~ trial clamping today 9/28 per Surgery with trickle feeds -Pain control -General Surgery and GI following, appreciate input -S/p Neostigmine x2 -Tentative plan for Colonic decompression 9/29  Paroxsymal Atrial Flutter Chronic HFpEF Hypertension Echocardiogram 05/31/22 with LVEF 50-55%, indeterminate Diastolic parameters, RV function normal -Continuous cardiac monitoring -Maintain MAP >65 -Diuresis as BP and renal function permits -Continue Heparin gtt for anticoagulation -Troponin negative x2  Acute Metabolic Encephalopathy PMHx: Seizures, stroke, Cervical spinal stenosis -Provide supportive care -Avoid sedating meds as able -Seizure and aspiration precautions -Continue Keppra and Valproate MRI showing multilevel cervical spondylosis most pronounced at C3-C4 where moderate to severe canal stenosis and severe bilateral foraminal stenosis.  Patient is not a great surgical candidate at this point.      Best Practice (right click and "Reselect all SmartList Selections" daily)   Diet/type: tubefeeds and NPO DVT prophylaxis: Heparin gtt GI prophylaxis: PPI Lines: N/A Foley:  N/A Code Status:  full code Last date of multidisciplinary goals of care discussion [06/02/2022]  Labs   CBC: Recent Labs  Lab 05/28/22 1208 05/28/22 1817 05/29/22 0742 05/29/22 0824 05/30/22 0259 05/31/22 0342 06/02/22 0205  WBC 9.8   < >  11.1* 10.4 8.0 7.3 8.2  NEUTROABS 8.5*  --   --   --   --   --   --   HGB 14.9   < > 14.0 13.9 12.3* 13.3 14.5  HCT 47.0   < > 43.8 44.0 39.0 41.6 44.8  MCV 95.5   < > 95.0 94.6 94.7 94.3 93.7  PLT 206   < > 192 195 170 169 179   < > = values in this interval not displayed.     Basic Metabolic Panel: Recent Labs  Lab 05/28/22 1208 05/28/22 1212  05/29/22 0742 05/30/22 0259 05/31/22 0342 06/02/22 0205  NA 140  --  141 141 144 142  K 4.6  --  4.3 3.7 3.7 3.6  CL 107  --  108 111 110 110  CO2 22  --  26 24 21* 18*  GLUCOSE 122*  --  112* 80 80 158*  BUN 13  --  '13 18 18 15  '$ CREATININE 1.18  --  1.26* 1.41* 1.22 1.27*  CALCIUM 9.2  --  9.1 8.4* 8.9 8.7*  MG  --  2.3  --  2.2 2.1 2.4  PHOS  --   --   --   --   --  3.8    GFR: Estimated Creatinine Clearance: 61.3 mL/min (A) (by C-G formula based on SCr of 1.27 mg/dL (H)). Recent Labs  Lab 05/28/22 1215 05/28/22 1817 05/29/22 0824 05/30/22 0259 05/31/22 0342 06/02/22 0205  WBC  --    < > 10.4 8.0 7.3 8.2  LATICACIDVEN 1.2  --   --   --   --   --    < > = values in this interval not displayed.     Liver Function Tests: Recent Labs  Lab 05/28/22 1208 05/29/22 0742  AST 28 18  ALT 17 13  ALKPHOS 64 61  BILITOT 1.0 0.8  PROT 7.8 7.3  ALBUMIN 4.2 4.0    Recent Labs  Lab 05/28/22 1208  LIPASE 30    No results for input(s): "AMMONIA" in the last 168 hours.  ABG    Component Value Date/Time   PHART 7.43 08/06/2019 1634   PCO2ART 38 08/06/2019 1634   PO2ART 66 (L) 08/06/2019 1634   HCO3 22.1 06/01/2022 1631   ACIDBASEDEF 0.2 06/01/2022 1631   O2SAT 95.4 06/01/2022 1631     Coagulation Profile: Recent Labs  Lab 05/28/22 1208 05/29/22 0742 06/01/22 0740  INR 1.0 1.0 1.2     Cardiac Enzymes: No results for input(s): "CKTOTAL", "CKMB", "CKMBINDEX", "TROPONINI" in the last 168 hours.  HbA1C: Hgb A1c MFr Bld  Date/Time Value Ref Range Status  05/28/2022 06:17 PM 5.8 (H) 4.8 - 5.6 % Final    Comment:    (NOTE) Pre diabetes:          5.7%-6.4%  Diabetes:              >6.4%  Glycemic control for   <7.0% adults with diabetes   08/09/2019 07:31 AM 5.7 (H) 4.8 - 5.6 % Final    Comment:    (NOTE) Pre diabetes:          5.7%-6.4% Diabetes:              >6.4% Glycemic control for   <7.0% adults with diabetes     CBG: Recent Labs  Lab  05/28/22 1503 05/31/22 1539  GLUCAP 99 99     Review of Systems:   Positives in  BOLD: Gen: Denies fever, chills, weight change, fatigue, night sweats HEENT: Denies blurred vision, double vision, hearing loss, tinnitus, sinus congestion, rhinorrhea, sore throat, neck stiffness, dysphagia PULM: Denies shortness of breath, cough, sputum production, hemoptysis, wheezing CV: Denies chest pain, edema, orthopnea, paroxysmal nocturnal dyspnea, palpitations GI: Denies abdominal pain, nausea, vomiting, diarrhea, hematochezia, melena, constipation, change in bowel habits GU: Denies dysuria, hematuria, polyuria, oliguria, urethral discharge Endocrine: Denies hot or cold intolerance, polyuria, polyphagia or appetite change Derm: Denies rash, dry skin, scaling or peeling skin change Heme: Denies easy bruising, bleeding, bleeding gums Neuro: Denies headache, numbness, weakness, slurred speech, loss of memory or consciousness   Past Medical History:  He,  has a past medical history of Alcohol abuse, Anemia, Anxiety, Arthritis, Cancer ([place]), COPD (chronic obstructive pulmonary disease) ([place]), Coronary artery disease, Depression, Gout, Hypertension, Myocardial infarction ([place]), Neuromuscular disorder ([place]), Seizures ([place]), Stroke ([place]), and Tremors of nervous system.   Surgical History:   Past Surgical History:  Procedure Laterality Date   CARPAL TUNNEL RELEASE Left 10/19/2015   Procedure: CARPAL TUNNEL RELEASE;  Surgeon: Hessie Knows, MD;  Location: [facility] ORS;  Service: Orthopedics;  Laterality: Left;   COLON SURGERY     COLONOSCOPY WITH PROPOFOL N/A 10/26/2021   Procedure: COLONOSCOPY WITH PROPOFOL;  Surgeon: Lesly Rubenstein, MD;  Location: [facility] ENDOSCOPY;  Service: Endoscopy;  Laterality: N/A;   JOINT REPLACEMENT     left partial hip    THROAT SURGERY  2013   cancer     Social History:   reports that he has been smoking. He has never used smokeless tobacco. He reports current alcohol use.  He reports that he does not use drugs.   Family History:  His family history includes Cancer in his mother; Hypertension in his father; Leukemia in his brother.   Allergies No Known Allergies   Home Medications  Prior to Admission medications   Medication Sig Start Date End Date Taking? Authorizing Provider  acetaminophen (TYLENOL) 325 MG tablet Take 650 mg by mouth every 6 (six) hours.   Yes [provider]  acidophilus (RISAQUAD) CAPS capsule Take 1 capsule by mouth daily.   Yes [provider]  ALPRAZolam (XANAX) 0.25 MG tablet Take 0.25 mg by mouth daily.   Yes [provider]  ALPRAZolam Duanne Moron) 0.5 MG tablet Take 0.5 mg by mouth 2 (two) times daily.   Yes [provider]  aspirin 81 MG chewable tablet Take 81 mg by mouth daily.   Yes [provider]  bisacodyl (DULCOLAX) 10 MG suppository Place 10 mg rectally in the morning.   Yes [provider]  bisacodyl (DULCOLAX) 5 MG EC tablet Take 5 mg by mouth daily.   Yes [provider]  Calcium Carbonate-Vit D-Min (CALCIUM 600+D PLUS MINERALS) 600-400 MG-UNIT TABS Take 1 tablet by mouth 2 (two) times daily.   Yes [provider]  furosemide (LASIX) 20 MG tablet Take 1 tablet (20 mg total) by mouth daily. 11/02/21  Yes Wouk, Ailene Rud, MD  gabapentin (NEURONTIN) 100 MG capsule Take 100 mg by mouth in the morning. (Take only with morning '800mg'$  dose to equal '900mg'$  total)   Yes [provider]  gabapentin (NEURONTIN) 400 MG capsule Take 2 capsules (800 mg total) by mouth 3 (three) times daily. 10/02/17  Yes Max Sane, MD  guaiFENesin (MUCINEX) 600 MG 12 hr tablet Take 600 mg by mouth 2 (two) times daily.   Yes [provider]  HYDROcodone-acetaminophen (NORCO/VICODIN) 5-325 MG tablet Take  0.5 tablets by mouth every 12 (twelve) hours. 11/02/21  Yes Wouk, Ailene Rud, MD  lactulose [facility]) 10 GM/15ML solution Take 20 g by mouth daily.   Yes [provider]  levETIRAcetam (KEPPRA) 100 MG/ML solution Take 1,000 mg by mouth 2 (two) times daily.   Yes [provider]  Lidocaine HCl 4 % CREA Apply 1 application topically daily. Apply from left side of face, down to left side of body to left foot   Yes [provider]  lisinopril (ZESTRIL) 5 MG tablet Take 1 tablet (5 mg total) by mouth daily. 08/14/19  Yes Dhungel, Nishant, MD  melatonin 3 MG TABS tablet Take 6 mg by mouth at bedtime.   Yes [provider]  metoCLOPramide (REGLAN) 5 MG tablet Take 5 mg by mouth 4 (four) times daily.   Yes [provider]  metoprolol succinate (TOPROL-XL) 25 MG 24 hr tablet Take 25 mg by mouth daily.   Yes [provider]  nystatin (MYCOSTATIN/NYSTOP) powder Apply 1 application topically 2 (two) times daily as needed (groin rash).   Yes [provider]  Omega-3 1000 MG CAPS Take 1,000 mg by mouth daily.   Yes [provider]  pantoprazole (PROTONIX) 20 MG tablet Take 20 mg by mouth in the morning.   Yes [provider]  polyethylene glycol (MIRALAX / GLYCOLAX) 17 g packet Take 34 g by mouth daily. 11/02/21  Yes Wouk, Ailene Rud, MD  promethazine [facility]) 25 MG/ML injection Inject 25 mg into the vein every 4 (four) hours as needed for nausea or vomiting.   Yes [provider]  senna (SENOKOT) 8.6 MG TABS tablet Take 1 tablet (8.6 mg total) by mouth at bedtime. Patient taking differently: Take 1 tablet by mouth every evening. 09/20/19  Yes Veronese, [place], MD  sertraline (ZOLOFT) 50 MG tablet Take 50 mg by mouth daily.   Yes [provider]  simvastatin (ZOCOR) 10 MG tablet Take 10 mg by mouth at bedtime.    Yes [provider]  tamsulosin (FLOMAX) 0.4 MG CAPS capsule Take 0.4 mg by mouth every evening.   Yes [provider]  tiotropium (SPIRIVA) 18 MCG inhalation capsule Place 18 mcg into inhaler and inhale daily.   Yes [provider]   Wheat Dextrin (BENEFIBER DRINK MIX PO) Take 5 g by mouth daily.   Yes [provider]  Zinc Oxide 25 % PSTE Apply 1 Application topically 3 (three) times daily as needed (peri area).   Yes [provider]  alum & mag hydroxide-simeth (MAALOX/MYLANTA) 200-200-20 MG/5ML suspension Take 30 mLs by mouth every 12 (twelve) hours as needed for indigestion or heartburn.    [provider]  B Complex Vitamins (VITAMIN B-COMPLEX) TABS Take 1 tablet by mouth daily. Patient not taking: Reported on 05/28/2022 06/09/21   [provider]  B Complex-C (B-COMPLEX WITH VITAMIN C) tablet Take 1 tablet by mouth daily. Patient not taking: Reported on 05/28/2022    [provider]  clindamycin-benzoyl peroxide (BENZACLIN) gel Apply 1 application topically every morning. Patient not taking: Reported on 05/28/2022 10/03/21   [provider]  Dextromethorphan-Benzocaine (CEPACOL SORE THROAT & COUGH) 5-7.5 MG LOZG Use as directed 1 lozenge in the mouth or throat every 3 (three) hours as needed (sore throat).    [provider]  levETIRAcetam (KEPPRA) 500 MG tablet Take 1,000 mg by mouth 2 (two) times daily. Patient not taking: Reported on 05/28/2022    [provider]  Lidocaine 4 % PTCH Apply 1 patch topically at bedtime. Apply to left side of neck daily at bedtime for 5 days Patient not taking: Reported on 05/28/2022    [provider]  Menthol, Topical Analgesic, (BIOFREEZE) 4 % GEL Apply 1 application topically daily. (Apply to left side of face and body)    [provider]  nitroGLYCERIN (NITROSTAT) 0.4 MG SL tablet Place 0.4 mg under the tongue every 5 (five) minutes as needed for chest pain.    [provider]  ondansetron (ZOFRAN-ODT) 4 MG disintegrating tablet Take 4 mg by mouth every 8 (eight) hours as needed for nausea or vomiting.    [provider]     Care time: 38 minutes     Darel Hong,  AGACNP-BC [facility] epic messenger for cross cover needs If after hours, please call E-link

## 2022-06-02 NOTE — Progress Notes (Signed)
Nutrition Follow Up Note   DOCUMENTATION CODES:   Not applicable  INTERVENTION:   Resume Osmolite _0 /hr, once tolerating and having bowel movements, increase by 26m/hr q 8 hours until goal rate of 5107mhr is reached.   ProSource TF 20- Give 6013maily via tube, each supplement provides 80kcal and 20g of protein.   Free water flushes 69m76m hours to maintain tube patency   Regimen provides 2060kcal/day, 103g/day protein and 1186ml64m of free water.   Pt at high refeed risk; recommend monitor potassium, magnesium and phosphorus labs daily until stable  MVI daily via tube   NUTRITION DIAGNOSIS:   Inadequate oral intake related to acute illness as evidenced by other (comment) (pt on NPO/clear liquid diet since admission).  GOAL:   Patient will meet greater than or equal to 90% of their needs -not met   MONITOR:   Labs, Weight trends, TF tolerance, Skin, I & O's  ASSESSMENT:   70 y.16 male with medical history significant of Ogilvie's syndrome, ileus, hypertension, hyperlipidemia, COPD, MI, stroke with mild left-sided weakness, GERD, gout, depression with anxiety, seizure, DDD, CAD, MI,  alcohol abuse, CKD stage IIIa, dCHF, colon cancer and thyroid cancer who is admitted with colonic ileus secondary to Ogilvie syndrome and possible SBO.  Met with pt in room today. Pt is more alert today. Pt tolerated trickle tube feeds up until he was made NPO at midnight. Pt is awaiting colonoscopy today for decompression. Plan is to resume trickle tube feeds this evening. Pt seen by SLP and was made NPO for high aspiration risk. Pt remains at high refeed risk. Palliative care is following.    Per chart, pt is weight stable since admission.   Medications reviewed and include: nicotine, protonix, NaCl _1 /hr, zosyn  Diet Order:   Diet Order             Diet NPO time specified  Diet effective now                  EDUCATION NEEDS:   No education needs have been identified at  this time  Skin:  Skin Assessment: Reviewed RN Assessment  Last BM:  9/28- small amount of liquid stool per RN report  Height:   Ht Readings from Last 1 Encounters:  06/02/22 _2  (1.803 m)    Weight:   Wt Readings from Last 1 Encounters:  06/02/22 96 kg    Ideal Body Weight:  78 kg  BMI:  Body mass index is 29.52 kg/m.  Estimated Nutritional Needs:   Kcal:  1900-2200kcal/day  Protein:  95-110g/day  Fluid:  1.9-2.2L/day  CaseyKoleen DistanceRD, LDN Please refer to [name] and/or RD on-call/weekend/after hours pager

## 2022-06-02 NOTE — Assessment & Plan Note (Addendum)
Will need to be restarted on gabapentin once able to swallow

## 2022-06-02 NOTE — Transfer of Care (Signed)
Immediate Anesthesia Transfer of Care Note  Patient: Calvin [name]  Procedure(s) Performed: COLONOSCOPY WITH PROPOFOL  Patient Location: PACU and Endoscopy Unit  Anesthesia Type:General  Level of Consciousness: awake  Airway & Oxygen Therapy: Patient Spontanous Breathing  Post-op Assessment: Report given to RN  Post vital signs: stable  Last Vitals:  Vitals Value Taken Time  BP 121/82 06/02/22 1423  Temp    Pulse 89 06/02/22 1424  Resp 25 06/02/22 1425  SpO2 100 % 06/02/22 1424  Vitals shown include unvalidated device data.  Last Pain:  Vitals:   06/02/22 0800  TempSrc: Axillary  PainSc:          Complications: No notable events documented.

## 2022-06-02 NOTE — TOC Progression Note (Signed)
Transition of Care [facility]) - Progression Note    Patient Details  Name: MERWYN HODAPP MRN: [id number] Date of Birth: [birth year]  Transition of Care [facility]) CM/SW Contact  Shelbie Hutching, RN Phone Number: 06/02/2022, 1:21 PM  Clinical Narrative:    Palliative Care has spoken with patient's daughter, daughter expressed to palliative that she did not want her father to return to [facility] and that she wanted the patient to remain full code and full scope treatment.  TOC expanded bed search and will cont to follow.    Expected Discharge Plan: [place] Barriers to Discharge: Continued Medical Work up  Expected Discharge Plan and Services Expected Discharge Plan: [place]   Discharge Planning Services: CM Consult Post Acute Care Choice: Resumption of Svcs/PTA Provider, [place] Living arrangements for the past 2 months: [place]                 DME Arranged: N/A DME Agency: NA       HH Arranged: NA HH Agency: NA         Social Determinants of Health (SDOH) Interventions    Readmission Risk Interventions    06/02/2022   12:02 PM 10/17/2021   10:47 AM  Readmission Risk Prevention Plan  Transportation Screening Complete Complete  PCP or Specialist Appt within 3-5 Days  Complete  Social Work Consult for [place] Planning/Counseling  Complete  Palliative Care Screening  Not Applicable  Medication Review Press photographer) Complete Complete  PCP or Specialist appointment within 3-5 days of discharge Complete   HRI or [place] Complete   SW Recovery Care/Counseling Consult Complete   Palliative Care Screening Complete   Skilled Nursing Facility Complete

## 2022-06-02 NOTE — TOC Progression Note (Signed)
Transition of Care [facility]) - Progression Note    Patient Details  Name: Calvin [name] MRN: [id number] Date of Birth: December 29, 1950  Transition of Care [facility]) CM/SW Contact  Shelbie Hutching, RN Phone Number: 06/02/2022, 12:05 PM  Clinical Narrative:    Patient is from [facility] facility for long term care.  RNCM left a message for patient's daughter to return call.  Patient may go for colonoscopy for decompression today.  NG tube, NPO.  Palliative care has been consulted.  TOC will follow.     Expected Discharge Plan: [place] Barriers to Discharge: Continued Medical Work up  Expected Discharge Plan and Services Expected Discharge Plan: [place]   Discharge Planning Services: CM Consult Post Acute Care Choice: Resumption of Svcs/PTA Provider, [place] Living arrangements for the past 2 months: [place]                 DME Arranged: N/A DME Agency: NA       HH Arranged: NA HH Agency: NA         Social Determinants of Health (SDOH) Interventions    Readmission Risk Interventions    06/02/2022   12:02 PM 10/17/2021   10:47 AM  Readmission Risk Prevention Plan  Transportation Screening Complete Complete  PCP or Specialist Appt within 3-5 Days  Complete  Social Work Consult for [place] Planning/Counseling  Complete  Palliative Care Screening  Not Applicable  Medication Review Press photographer) Complete Complete  PCP or Specialist appointment within 3-5 days of discharge Complete   HRI or [place] Complete   SW Recovery Care/Counseling Consult Complete   Palliative Care Screening Complete   Skilled Nursing Facility Complete

## 2022-06-02 NOTE — NC FL2 (Signed)
[place] LEVEL OF CARE SCREENING TOOL     IDENTIFICATION  [name] Birthdate: May 14, 1951 Sex: male Admission Date (Current Location): 05/28/2022  [facility] and [place] Number:  Engineering geologist and Address:  [facility], [address] [place], [place] [address]      Provider Number: [id number]  Attending Physician Name and Address:  Loletha Grayer, MD  Relative Name and Phone Number:       Current Level of Care: Hospital Recommended Level of Care: [place] Prior Approval Number:    Date Approved/Denied:   PASRR Number:    Discharge Plan: SNF    Current Diagnoses: Patient Active Problem List   Diagnosis Date Noted   Respiratory distress 06/02/2022   Neuropathy 06/02/2022   Paroxysmal atrial flutter ([place]) 06/01/2022   Aspiration pneumonia ([place]) 06/01/2022   Cervical spinal stenosis 05/31/2022   Chronic diastolic CHF (congestive heart failure) ([place]) 05/28/2022   Coffee ground emesis 05/28/2022   Small bowel obstruction (HCC)    Abdominal distention    HLD (hyperlipidemia) 10/16/2021   Nausea vomiting and diarrhea 10/16/2021   Sepsis ([place]) 10/16/2021   Chronic kidney disease, stage 3a ([place]) 10/16/2021   Tobacco abuse 10/16/2021   Muscle twitching 08/14/2019   UTI (urinary tract infection) 08/03/2019   Ileus ([place]) 08/03/2019   Hypokalemia 08/03/2019   QT prolongation 08/03/2019   Ogilvie's syndrome    Abdominal pain 04/03/2019   Left-sided weakness 09/30/2017   Seizures ([place]) 09/30/2017   HTN (hypertension) 09/30/2017   CAD (coronary artery disease) 09/30/2017   COPD (chronic obstructive pulmonary disease) ([place]) 09/30/2017   Depression with anxiety 09/30/2017    Orientation RESPIRATION BLADDER Height & Weight     Self  Normal External catheter Weight: 96 kg Height:  '5\' 11"'$  (180.3 cm)  BEHAVIORAL SYMPTOMS/MOOD NEUROLOGICAL BOWEL NUTRITION STATUS      Incontinent Diet (see  discharge summary)  AMBULATORY STATUS COMMUNICATION OF NEEDS Skin   Total Care Verbally Normal                       Personal Care Assistance Level of Assistance  Total care, Bathing, Feeding, Dressing Bathing Assistance: Maximum assistance Feeding assistance: Maximum assistance Dressing Assistance: Maximum assistance Total Care Assistance: Maximum assistance   Functional Limitations Info  Sight, Hearing, Speech Sight Info: Impaired Hearing Info: Impaired Speech Info: Impaired    SPECIAL CARE FACTORS FREQUENCY                       Contractures Contractures Info: Not present    Additional Factors Info  Code Status, Allergies Code Status Info: Full Allergies Info: NKA           Current Medications (06/02/2022):  This is the current hospital active medication list Current Facility-Administered Medications  Medication Dose Route Frequency Provider Last Rate Last Admin   0.9 %  sodium chloride infusion   Intravenous Continuous Loletha Grayer, MD 75 mL/hr at 06/02/22 0600 Infusion Verify at 06/02/22 0600   acetaminophen (TYLENOL) tablet 650 mg  650 mg Oral Q4H PRN Ivor Costa, MD       Or   acetaminophen (TYLENOL) 160 MG/5ML solution 650 mg  650 mg Per Tube Q4H PRN Ivor Costa, MD   650 mg at 06/01/22 2202   Or   acetaminophen (TYLENOL) suppository 650 mg  650 mg Rectal Q4H PRN Ivor Costa, MD       albuterol (  PROVENTIL) (2.5 MG/3ML) 0.083% nebulizer solution 2.5 mg  2.5 mg Nebulization Q4H PRN Ivor Costa, MD   2.5 mg at 05/31/22 1624   budesonide (PULMICORT) nebulizer solution 0.5 mg  0.5 mg Nebulization BID Darel Hong D, NP   0.5 mg at 06/02/22 0750   Chlorhexidine Gluconate Cloth 2 % PADS 6 each  6 each Topical Q0600 Loletha Grayer, MD   6 each at 06/02/22 0543   feeding supplement (OSMOLITE 1.5 CAL) liquid 1,000 mL  1,000 mL Per Tube Q24H Loletha Grayer, MD   Infusion Verify at 06/02/22 0600   free water 30 mL  30 mL Per Tube Q4H Loletha Grayer, MD    30 mL at 06/01/22 2331   gabapentin (NEURONTIN) 250 MG/5ML solution 400 mg  400 mg Per Tube Q12H Darel Hong D, NP   400 mg at 06/02/22 1131   hydrALAZINE (APRESOLINE) injection 5 mg  5 mg Intravenous Q2H PRN Ivor Costa, MD   5 mg at 06/01/22 1606   ipratropium-albuterol (DUONEB) 0.5-2.5 (3) MG/3ML nebulizer solution 3 mL  3 mL Nebulization BID Loletha Grayer, MD   3 mL at 06/02/22 0750   levETIRAcetam (KEPPRA) IVPB 1000 mg/100 mL premix  1,000 mg Intravenous Q12H Bhagat, Srishti L, MD 400 mL/hr at 06/02/22 0850 1,000 mg at 06/02/22 0850   lidocaine (XYLOCAINE) 2 % viscous mouth solution 15 mL  15 mL Mouth/Throat Q6H PRN Wieting, Richard, MD       lidocaine HCl (PF) (XYLOCAINE) 2 % injection 10 mL  10 mL Other Once Lesly Rubenstein, MD       lisinopril (ZESTRIL) tablet 10 mg  10 mg Per Tube Daily Lockie Mola B, RPH   10 mg at 06/01/22 1524   LORazepam (ATIVAN) injection 0.5 mg  0.5 mg Intravenous Q8H PRN Ivor Costa, MD       LORazepam (ATIVAN) injection 1 mg  1 mg Intravenous QHS Bhagat, Srishti L, MD   1 mg at 06/01/22 2149   LORazepam (ATIVAN) injection 1 mg  1 mg Intravenous Q2H PRN Ivor Costa, MD   1 mg at 05/31/22 1741   metoprolol tartrate (LOPRESSOR) injection 5 mg  5 mg Intravenous Q1H PRN Loletha Grayer, MD       metoprolol tartrate (LOPRESSOR) tablet 12.5 mg  12.5 mg Per Tube BID Loletha Grayer, MD   12.5 mg at 06/01/22 2149   morphine (PF) 2 MG/ML injection 0.5 mg  0.5 mg Intravenous Q4H PRN Loletha Grayer, MD   0.5 mg at 06/01/22 1202   nicotine (NICODERM CQ - dosed in mg/24 hours) patch 21 mg  21 mg Transdermal Daily Ivor Costa, MD   21 mg at 06/02/22 1020   nitroGLYCERIN (NITROSTAT) SL tablet 0.4 mg  0.4 mg Sublingual Q5 min PRN Corman, Katy L, NP   0.4 mg at 05/30/22 0309   ondansetron (ZOFRAN) injection 4 mg  4 mg Intravenous Q8H PRN Ivor Costa, MD       pantoprazole (PROTONIX) injection 40 mg  40 mg Intravenous Q12H Ivor Costa, MD   40 mg at 06/02/22 1020   phenol  (CHLORASEPTIC) mouth spray 1 spray  1 spray Mouth/Throat PRN Rometta Emery, RN   1 spray at 05/30/22 1456   piperacillin-tazobactam (ZOSYN) IVPB 3.375 g  3.375 g Intravenous Q8H Lockie Mola B, RPH 12.5 mL/hr at 06/02/22 0607 3.375 g at 06/02/22 1017   senna-docusate (Senokot-S) tablet 1 tablet  1 tablet Oral QHS PRN Ivor Costa, MD  trolamine salicylate (ASPERCREME) 10 % cream   Topical BID PRN Lamphear, Katy L, NP   Given at 05/30/22 0816   valproate (DEPACON) 250 mg in dextrose 5 % 50 mL IVPB  250 mg Intravenous Q6H Bhagat, Srishti L, MD 52.5 mL/hr at 06/02/22 0600 Infusion Verify at 06/02/22 0600     Discharge Medications: Please see discharge summary for a list of discharge medications.  Relevant Imaging Results:  Relevant Lab Results:   Additional Information SS#: [id number]  Shelbie Hutching, RN

## 2022-06-02 NOTE — Consult Note (Signed)
ANTICOAGULATION CONSULT NOTE  Pharmacy Consult for IV Heparin Indication: atrial fibrillation  Patient Measurements: Height: '5\' 11"'$  (180.3 cm) Weight: 96 kg (211 lb 10.3 oz) IBW/kg (Calculated) : 75.3 Heparin Dosing Weight: 94.7 kg  Labs: Recent Labs    05/30/22 0449 05/31/22 0342 06/01/22 0740 06/01/22 1741 06/02/22 0205  HGB  --  13.3  --   --  14.5  HCT  --  41.6  --   --  44.8  PLT  --  169  --   --  179  APTT  --   --  35  --   --   LABPROT  --   --  15.2  --   --   INR  --   --  1.2  --   --   HEPARINUNFRC  --   --   --  0.57 0.91*  CREATININE  --  1.22  --   --  1.27*  TROPONINIHS 23*  --   --   --   --      Estimated Creatinine Clearance: 61.3 mL/min (A) (by C-G formula based on SCr of 1.27 mg/dL (H)).   Medical History: Past Medical History:  Diagnosis Date   Alcohol abuse    drinks on weekend   Anemia    Anxiety    Arthritis    Cancer (HCC)    colon,throat   COPD (chronic obstructive pulmonary disease) ([place])    Coronary artery disease    Depression    Gout    Hypertension    Myocardial infarction ([place])    Neuromuscular disorder ([place])    Seizures ([place])    last 6 months ago   Stroke [facility])    multiple  left side weakness   Tremors of nervous system     Medications:  No anticoagulation prior to admission per my chart review  Assessment: Patient is a 71 y/o M with medical history as above who is admitted with SBO in setting of chronic Ogilvie's syndrome. Pharmacy consulted to initiate heparin infusion for atrial fibrillation.   Date Time  HL Rate/Comment 9/28 1741 0.57 Therapeutic x1; 1400 un/hr  9/29     0205    0.91    SUPRAtherapeutic     Baseline aPTT and PT-INR are pending. Baseline CBC within normal limits.   Goal of Therapy:  Heparin level 0.3-0.7 units/ml Monitor platelets by anticoagulation protocol: Yes   Plan:  9/29:  HL '@0205'$  = 0.91, SUPRAtherapeutic  - heparin gtt to be held starting @ 0400 for GI procedure Will need to f/u  plans for heparin after procedure.   Jaylia Pettus D Clinical Pharmacist 06/02/2022 3:37 AM

## 2022-06-02 NOTE — Op Note (Signed)
[facility] [name] Procedure Date: 06/02/2022 1:20 PM MRN: [id number] Account #: [id number] Date of Birth: 03-Aug-1951 Admit Type: Inpatient Age: 71 Room: [facility] ENDO ROOM 3 Gender: Male Note Status: Finalized Instrument Name: [name] [id number] Procedure:             Colonoscopy Indications:           Therapeutic procedure Providers:             Andrey Farmer MD, MD Referring MD:          No Local Md, MD (Referring MD) Medicines:             Monitored Anesthesia Care Complications:         No immediate complications. Procedure:             Pre-Anesthesia Assessment:                        - Prior to the procedure, a History and Physical was                         performed, and patient medications and allergies were                         reviewed. The patient is unable to give consent                         secondary to the patient's altered mental status. The                         risks and benefits of the procedure and the sedation                         options and risks were discussed with the patient's                         daughter. All questions were answered and informed                         consent was obtained. Patient identification and                         proposed procedure were verified by the physician, the                         nurse, the anesthesiologist, the anesthetist and the                         technician in the endoscopy suite. Mental Status                         Examination: alert and oriented. Airway Examination:                         normal oropharyngeal airway and neck mobility.                         Respiratory Examination: clear to auscultation. CV  Examination: normal. Prophylactic Antibiotics: The                         patient does not require prophylactic antibiotics.                         Prior Anticoagulants: The patient has taken no                          previous anticoagulant or antiplatelet agents. ASA                         Grade Assessment: IV - A patient with severe systemic                         disease that is a constant threat to life. After                         reviewing the risks and benefits, the patient was                         deemed in satisfactory condition to undergo the                         procedure. The anesthesia plan was to use monitored                         anesthesia care (MAC). Immediately prior to                         administration of medications, the patient was                         re-assessed for adequacy to receive sedatives. The                         heart rate, respiratory rate, oxygen saturations,                         blood pressure, adequacy of pulmonary ventilation, and                         response to care were monitored throughout the                         procedure. The physical status of the patient was                         re-assessed after the procedure.                        After obtaining informed consent, the colonoscope was                         passed under direct vision. Throughout the procedure,                         the patient's blood pressure, pulse, and oxygen  saturations were monitored continuously. The                         Colonoscope was introduced through the anus and                         advanced to the the transverse colon for evaluation.                         This was the intended extent. The colonoscopy was                         technically difficult and complex due to poor bowel                         prep. The patient tolerated the procedure well. The                         quality of the bowel preparation was poor. Findings:      The perianal and digital rectal examinations were normal.      A moderate amount of stool was found in the entire colon, making       visualization  difficult.      The lumen of the recto-sigmoid colon, sigmoid colon, descending colon,       splenic flexure and transverse colon was significantly dilated.       Decompression performed by suctioning air. Decompression tube attempted       to be placed but due to equipment malfunction, unable to be placed. Impression:            - Preparation of the colon was poor.                        - Stool in the entire examined colon.                        - Dilated in the recto-sigmoid colon, in the sigmoid                         colon, in the descending colon, at the splenic flexure                         and in the transverse colon.                        - No specimens collected. Recommendation:        - Return patient to hospital ward for ongoing care.                        - Recommend trickle feeds and advance as tolerated.                         Recommend miralax BID as well. Procedure Code(s):     --- Professional ---                        [phone], 71, Colonoscopy, flexible; diagnostic,  including collection of specimen(s) by brushing or                         washing, when performed (separate procedure) Diagnosis Code(s):     --- Professional ---                        K59.39, Other megacolon CPT copyright 2019 American Medical Association. All rights reserved. The codes documented in this report are preliminary and upon coder review may  be revised to meet current compliance requirements. Andrey Farmer MD, MD 06/02/2022 2:30:20 PM Number of Addenda: 0 Note Initiated On: 06/02/2022 1:20 PM Total Procedure Duration: 0 hours 12 minutes 53 seconds  Estimated Blood Loss:  Estimated blood loss: none.      [facility]

## 2022-06-02 NOTE — Progress Notes (Signed)
[facility] PROGRESS NOTE (cpt [phone])  Hospital Day(s): 5.   Interval History: Patient seen and examined. Patient does not participate in history. RN states he is more stable this AM. Remains without leukocytosis with WBC 8.2K. Renal function seems to remain near baseline; sCr - 1.27; UO - 800 ccs. NO electrolyte derangements. NGT now used for enteric feedings. He did receive a second dose of neostigmine yesterday; BM recorded. KUBs pending but not completed yet. GI following; potential for decompressive colonoscopy today.   Review of Systems:  Unable to reliably preform this morning secondary confusion, poor historian, no family at bedside to assist  Vital signs in last 24 hours: [min-max] current  Temp:  [98.1 F (36.7 C)-98.4 F (36.9 C)] 98.4 F (36.9 C) (09/29 0000) Pulse Rate:  [51-110] 72 (09/29 0700) Resp:  [20-33] 20 (09/29 0700) BP: (100-180)/(69-109) 100/73 (09/29 0700) SpO2:  [92 %-99 %] 94 % (09/29 0700)     Height: '5\' 11"'$  (180.3 cm) Weight: 96 kg BMI (Calculated): 29.53   Intake/Output last 2 shifts:  09/28 0701 - 09/29 0700 In: 2481.7 [I.V.:1551.4; NG/GT:214.5; IV Piggyback:715.7] Out: 68 [Urine:800; Emesis/NG output:30]   Physical Exam:  Constitutional: Alert, NAD HENT: normocephalic without obvious abnormality; NGT in place (clamped) Eyes: PERRL, EOM's grossly intact and symmetric  Respiratory: breathing non-labored at rest; receiving breathing treatment Cardiovascular:Afib, 110 bpm Gastrointestinal: Soft, remains distended but improved mildly, no apparent tenderness, no rebound/guarding. He is certainly not peritonitic    Labs:     Latest Ref Rng & Units 06/02/2022    2:05 AM 05/31/2022    3:42 AM 05/30/2022    2:59 AM  CBC  WBC 4.0 - 10.5 K/uL 8.2  7.3  8.0   Hemoglobin 13.0 - 17.0 g/dL 14.5  13.3  12.3   Hematocrit 39.0 - 52.0 % 44.8  41.6  39.0   Platelets 150 - 400 K/uL 179  169  170       Latest Ref Rng & Units 06/02/2022     2:05 AM 05/31/2022    3:42 AM 05/30/2022    2:59 AM  CMP  Glucose 70 - 99 mg/dL 158  80  80   BUN 8 - 23 mg/dL '15  18  18   '$ Creatinine 0.61 - 1.24 mg/dL 1.27  1.22  1.41   Sodium 135 - 145 mmol/L 142  144  141   Potassium 3.5 - 5.1 mmol/L 3.6  3.7  3.7   Chloride 98 - 111 mmol/L 110  110  111   CO2 22 - 32 mmol/L '18  21  24   '$ Calcium 8.9 - 10.3 mg/dL 8.7  8.9  8.4     Imaging studies:   KUB (06/02/2022) personally reviewed still with significant colonic distension, and radiologist report pending....   Assessment/Plan: (ICD-10's: K11.609) 71 y.o. male with radiographically resolved small bowel obstruction also with stable appearance of chronic colonic distension, complicated by significant comorbid disease, physical deconditioning, overall poor functional status.   - Appreciate GI input; neostigmine given 09/27 & 09/28; possible decompressive colonoscopy today (09/29) - Enteric feedings held for colonoscopy             - No emergent surgical intervention. He is a sub optimal candidate for any surgical intervention given his deconditioning and significant comorbid disease            - Monitor abdominal examination; on-going bowel function - Consider serial KUBs            -  Pain control prn; limit narcotics as much as feasible - Antiemetics prn - Therapies on board - Further management per primary service  All of the above findings and recommendations were discussed with the medical team. No family at bedside this morning.    -- Edison Simon, PA-C [facility] 06/02/2022, 7:31 AM M-F: 7am - 4pm

## 2022-06-02 NOTE — Progress Notes (Signed)
Progress Note   Patient: Calvin [name] UMP:[id number] DOB: 06-May-1951 DOA: 05/28/2022     5 DOS: the patient was seen and examined on 06/02/2022   Brief hospital course: Taken from H&P.  Calvin [name] is a 71 y.o. male with medical history significant of Ogilvie's syndrome, ileus, hypertension, hyperlipidemia, COPD, stroke with mild left-sided weakness, GERD, gout, depression with anxiety, seizure, CAD, MI,  alcohol abuse, CKD stage IIIa, dCHF, colon cancer, thyroid cancer, who presents from his facility with Nausea, vomiting, abdominal distention, abdominal pain for 2 days. Found to have new small bowel obstruction on CT abdomen with transition point in the right lower abdomen suspicious for adhesion. General surgery was consulted and NG tube was placed.  Patient also developed left-sided tingling and numbness and facial droop while in ED, has an history of left-sided stroke with residual weakness.  Symptoms resolved within few minutes. CT head and MRI brain was negative for any acute intracranial abnormality and do reflect a chronic changes. 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 Neurology was consulted-patient to complete stroke work-up.  9/25: Labs with mildly elevated creatinine at 1.26 with baseline seems to be around 1.1-1.2.  Lipid panel normal with LDL of 57 CBC with mild leukocytosis at 11.1.  Hemoglobin stable,A1c 5.8 Repeat abdominal x-ray with stable obstructive findings and no other acute abnormality.  Continued to have significant colonic distention.  Passing flatus, minimum secretions with NG tube. general surgery would like to continue with conservative management with NG tube. Speech and cognition appears to be at baseline per speech evaluation. Patient uses quite a bit of opioids for chronic pain which can be contributory.  Try avoiding opioids if possible. We can try a Toradol as needed for pain with a close monitoring of renal function.  9/26: Patient  overnight complained about chest pain, on evaluation by night cross coverage, he was complaining more of generalized aches and pain but did responded to nitroglycerin x1.  EKG with sinus tachycardia, troponin 20>> 23. Patient was given a dose of morphine.  Renal function with slight worsening of creatinine to 1.41 on 05/30/2022.  We will avoid Toradol and pain will be managed with morphine although can contribute to his slower bowel function.  Repeat abdominal x-ray with air-filled distention of large and small bowel loops favoring ileus. CT scan on 06/01/2022 did not show any signs of obstruction, small bowel last dilated.  Still had dilation of the colon.  The patient was transferred to the stepdown unit for neostigmine dosing on 05/31/2022 and 06/01/2022.  General surgery to start clear liquid diet and see how that goes.   Assessment and Plan: Ogilvie's syndrome General surgery does not think this is bowel obstruction.  Gastroenterology gave neostigmine infusion on 05/31/2022 and on 06/01/2022.  GI team to do a decompressive colonoscopy procedure today.  Patient did have a bowel movement last night.  Hopefully will be better with speech therapy today.  We will restart tube feedings after procedure.  Paroxysmal atrial flutter (HCC) Continue metoprolol.  Heparin drip on hold with procedure today.  Aspiration pneumonia ([place]) Started Zosyn  Cervical spinal stenosis MRI showing multilevel cervical spondylosis most pronounced at C3-C4 where moderate to severe canal stenosis and severe bilateral foraminal stenosis.  Patient is not a great surgical candidate at this point.  Respiratory distress Respiratory distress on 06/01/2022 with upper airway congestion.  Improved with Decadron and racemic epinephrine infusion.  Currently not on oxygen.  Chronic diastolic CHF (congestive heart failure) (HCC) -Last  EF 50% -Hold Lasix -Watch closely with IV fluids  Seizures (HCC) Seizure -Seizure  precaution -When necessary Ativan for seizure -IV Keppra and Depakote.    HTN (hypertension) Continue lisinopril and metoprolol via NG tube.  CAD (coronary artery disease) No chest pain.  Troponin negative x2 -Hold aspirin and Zocor.  Continue metoprolol  HLD (hyperlipidemia) - Hold Zocor  Chronic kidney disease, stage 3a (HCC) Last creatinine 1.27 with a GFR of 60  COPD (chronic obstructive pulmonary disease) (HCC) Continue nebulizers  Coffee ground emesis Hemoglobin stable.  Continue Protonix.  Tobacco abuse -nicotine patch  Depression with anxiety Hold home oral Xanax and Zoloft -As needed IV Ativan for anxiety  Neuropathy Looks more comfortable after restarting gabapentin        Subjective: Patient seen this morning and looked a little more comfortable.  Had a bowel movement last night.  Still with abdominal distention.  Had some upper airway congestion last night improved after Decadron and racemic epinephrine nebulizer.  Physical Exam: Vitals:   06/02/22 0800 06/02/22 0900 06/02/22 1000 06/02/22 1100  BP: (!) 135/95   115/82  Pulse: (!) 109 (!) 101 90 88  Resp: (!) 28 (!) 24 (!) 23 (!) 22  Temp:      TempSrc:      SpO2: 99% 95% 96% 97%  Weight:      Height:       Physical Exam HENT:     Head: Normocephalic.     Mouth/Throat:     Pharynx: No oropharyngeal exudate.  Eyes:     General: Lids are normal.     Conjunctiva/sclera: Conjunctivae normal.  Cardiovascular:     Rate and Rhythm: Normal rate and regular rhythm.     Heart sounds: Normal heart sounds, S1 normal and S2 normal.  Pulmonary:     Breath sounds: No decreased breath sounds, wheezing, rhonchi or rales.  Abdominal:     General: Bowel sounds are decreased. There is distension.     Palpations: Abdomen is soft.     Tenderness: There is abdominal tenderness.  Musculoskeletal:     Right lower leg: Swelling present.     Left lower leg: Swelling present.  Skin:    General: Skin is warm.      Findings: No rash.  Neurological:     Mental Status: He is alert.     Comments: Able to straight leg raise.     Data Reviewed: Creatinine 1.27 with a GFR of 16, CBC within normal range  Family Communication: Left message for patient's daughter  Disposition: Status is: Inpatient Remains inpatient appropriate because: Having decompressive colonoscopy procedure today Planned Discharge Destination: Rehab    Time spent: 27 minutes  Author: Loletha Grayer, MD 06/02/2022 12:06 PM  For on call review [email or web address].

## 2022-06-02 NOTE — Care Plan (Signed)
Colonic decompression performed. Unable to place decompression tube. Abdomen extremely improved after procedure.  - recommend starting trickle feeds - miralax BID - xray in morning - needs aggressive PT and movement - avoid opioids - replete electrolytes as needed  Dr. Virgina Jock will follow over the weekend.  Raylene Miyamoto MD, MPH [place]

## 2022-06-02 NOTE — Assessment & Plan Note (Signed)
Respiratory distress on 06/01/2022 with upper airway congestion.  Improved with Decadron and racemic epinephrine infusion.  Currently not on oxygen.

## 2022-06-02 NOTE — Consult Note (Signed)
Consultation Note Date: 06/02/2022   [name]  DOB: [birth year]  MRN: [id number]  Age / Sex: 71 y.o., male  PCP: Pcp, No Referring Physician: Loletha Grayer, MD  Reason for Consultation: Establishing goals of care  HPI/Patient Profile: 71 y.o. male  with past medical history of Ogilvie's syndrome, ileus, hypertension, hyperlipidemia, COPD, stroke with mild left-sided weakness, GERD, gout, depression with anxiety, seizure, CAD, MI,  alcohol abuse, CKD stage IIIa, dCHF, colon cancer, thyroid cancer admitted on 05/28/2022 with Nausea, vomiting, abdominal distention, abdominal pain for 2 days.  Patient diagnosed with Ogilvie's syndrome.  Patient was moved to ICU for neostigmine infusion.  Plans for decompressive colonoscopy 9/29.  Patient also with aspiration pneumonia.  MRI revealed cervical spinal stenosis.  On the evening of 9/28 patient developed respiratory distress.  SLP evaluation revealed severe aspiration risk and he was made NPO.  He was unable to manage his secretions during that evaluation.  PMT consulted to discuss goals of care.  Clinical Assessment and Goals of Care: I have reviewed medical records including EPIC notes, labs and imaging, received report from  [place], [place] and RN, assessed the patient and then spoke with patient's daughter [name] to discuss diagnosis prognosis, [place], EOL wishes, disposition and options.  I introduced Palliative Medicine as specialized medical care for people living with serious illness. It focuses on providing relief from the symptoms and stress of a serious illness. The goal is to improve quality of life for both the patient and the family.  During my evaluation with patient he could tell me he was at [facility] and when asked him why he was in the hospital he replied "colon".  However it was difficult to engage in further conversation as he was quite lethargic and speech was  garbled.  I did share with him that I noticed on his chart that he previously had a DNR form.  I explained to him what this meant and asked him if this was in line with his wishes and he tells me yes; however, I explained I would discuss with his family as well as again, I do not feel he can independently make decisions for himself at this time.  We discussed a brief life review of the patient.   Spoke on the phone with daughter [name].  She tells me prior to admission patient was living at [facility] for long-term care.  She tells me these issues with patient's colon has been an ongoing problem.  She tells me at facility he is wheelchair-bound.  She tells me he has a poor appetite there.  She tells me typically his mind is clear and he is appropriate; no cognitive impairment.   We discussed patient's current illness and what it means in the larger context of patient's on-going co-morbidities.  Natural disease trajectory and expectations at EOL were discussed.  We discussed his Ogilvie syndrome as well as altered mental status.  We discussed his dysphagia.  I attempted to elicit values and goals of care important to the patient.    The difference between aggressive medical intervention and comfort care was considered in light of the patient's goals of care.   I shared with daughter that patient previously had DNR form.  I asked her if this is something she and the patient ever talked about in the past or if this seemed consistent with his previously stated wishes.  She tells me know and at this point she would want patient to receive all medical  interventions offered including CPR and intubation if needed.  Discussed with daughter the importance of continued conversation with family and the medical providers regarding overall plan of care and treatment options, ensuring decisions are within the context of the patients values and GOCs.    Daughter did request that I pass along to [facility] team that she  does not want patient to return to [facility] at discharge.  I informed Doran Clay of her statement.  Questions and concerns were addressed. The family was encouraged to call with questions or concerns.  Primary Decision Maker NEXT OF KIN -daughter [name] At this point patient unable to make decisions independently.  Hopeful for improvement mental status in coming days.  SUMMARY OF RECOMMENDATIONS   Patient unable to independently participate in decision-making at the time of my evaluation.  Daughter requests full code/full scope care for the time being.  PMT will follow-up next week for improvement in patient mental status and ongoing goals of care discussions.  Code Status/Advance Care Planning: Full code      Primary Diagnoses: Present on Admission:  HTN (hypertension)  CAD (coronary artery disease)  COPD (chronic obstructive pulmonary disease) ([place])  Depression with anxiety  HLD (hyperlipidemia)  Chronic kidney disease, stage 3a (HCC)  Tobacco abuse  Chronic diastolic CHF (congestive heart failure) (HCC)  Coffee ground emesis  Ogilvie's syndrome   I have reviewed the medical record, interviewed the patient and family, and examined the patient. The following aspects are pertinent.  Past Medical History:  Diagnosis Date   Alcohol abuse    drinks on weekend   Anemia    Anxiety    Arthritis    Cancer (HCC)    colon,throat   COPD (chronic obstructive pulmonary disease) ([place])    Coronary artery disease    Depression    Gout    Hypertension    Myocardial infarction ([place])    Neuromuscular disorder ([place])    Seizures ([place])    last 6 months ago   Stroke [facility])    multiple  left side weakness   Tremors of nervous system    Social History   Socioeconomic History   Marital status: Single    Spouse name: Not on file   Number of children: Not on file   Years of education: Not on file   Highest education level: Not on file  Occupational History   Not on file   Tobacco Use   Smoking status: Every Day   Smokeless tobacco: Never  Substance and Sexual Activity   Alcohol use: Yes    Comment: weekends   Drug use: No   Sexual activity: Not on file  Other Topics Concern   Not on file  Social History Narrative   Not on file   Social Determinants of Health   Financial Resource Strain: Not on file  Food Insecurity: No Food Insecurity (05/29/2022)   Hunger Vital Sign    Worried About Running Out of Food in the Last Year: Never true    Ran Out of Food in the Last Year: Never true  Transportation Needs: No Transportation Needs (05/29/2022)   PRAPARE - Hydrologist (Medical): No    Lack of Transportation (Non-Medical): No  Physical Activity: Not on file  Stress: Not on file  Social Connections: Not on file   Family History  Problem Relation Age of Onset   Cancer Mother    Hypertension Father    Leukemia Brother    Scheduled  Meds:  [MAR Hold] budesonide (PULMICORT) nebulizer solution  0.5 mg Nebulization BID   [MAR Hold] Chlorhexidine Gluconate Cloth  6 each Topical Q0600   [MAR Hold] feeding supplement (OSMOLITE 1.5 CAL)  1,000 mL Per Tube Q24H   [MAR Hold] free water  30 mL Per Tube Q4H   [MAR Hold] gabapentin  400 mg Per Tube Q12H   [MAR Hold] ipratropium-albuterol  3 mL Nebulization BID   [MAR Hold] lidocaine HCl (PF)  10 mL Other Once   [MAR Hold] lisinopril  10 mg Per Tube Daily   [MAR Hold] LORazepam  1 mg Intravenous QHS   [MAR Hold] metoprolol tartrate  12.5 mg Per Tube BID   [MAR Hold] nicotine  21 mg Transdermal Daily   [MAR Hold] pantoprazole (PROTONIX) IV  40 mg Intravenous Q12H   Continuous Infusions:  sodium chloride 75 mL/hr at 06/02/22 0600   [MAR Hold] levETIRAcetam 1,000 mg (06/02/22 0850)   [MAR Hold] piperacillin-tazobactam (ZOSYN)  IV 3.375 g (06/02/22 0607)   [MAR Hold] valproate sodium 52.5 mL/hr at 06/02/22 0600   PRN Meds:.[MAR Hold] acetaminophen **OR** [MAR Hold] acetaminophen  (TYLENOL) oral liquid 160 mg/5 mL **OR** [MAR Hold] acetaminophen, [MAR Hold] albuterol, [MAR Hold] hydrALAZINE, [MAR Hold] lidocaine, [MAR Hold] LORazepam, [MAR Hold] LORazepam, [MAR Hold] metoprolol tartrate, [MAR Hold]  morphine injection, [MAR Hold] nitroGLYCERIN, [MAR Hold] ondansetron (ZOFRAN) IV, [MAR Hold] phenol, [MAR Hold] senna-docusate, [MAR Hold] trolamine salicylate No Known Allergies Review of Systems  Unable to perform ROS: Mental status change    Physical Exam Constitutional:      General: He is not in acute distress.    Appearance: He is ill-appearing.     Comments: Lethargic  Pulmonary:     Effort: Pulmonary effort is normal.  Abdominal:     General: There is distension.     Palpations: Abdomen is soft.  Skin:    General: Skin is warm and dry.  Neurological:     Comments: Oriented to place and somewhat to situation but mental status waxes and wanes     Vital Signs: BP (!) 148/89   Pulse 88   Temp 98.4 F (36.9 C) (Axillary)   Resp (!) 25   Ht '5\' 11"'$  (1.803 m)   Wt 96 kg   SpO2 97%   BMI 29.52 kg/m  Pain Scale: CPOT POSS *See Group Information*: S-Acceptable,Sleep, easy to arouse Pain Score: Asleep   SpO2: SpO2: 97 % O2 Device:SpO2: 97 % O2 Flow Rate: .   IO: Intake/output summary:  Intake/Output Summary (Last 24 hours) at 06/02/2022 1316 Last data filed at 06/02/2022 0600 Gross per 24 hour  Intake 2481.68 ml  Output 800 ml  Net 1681.68 ml    LBM: Last BM Date : 05/29/22 Baseline Weight: Weight: 95.1 kg Most recent weight: Weight: 96 kg     Palliative Assessment/Data: PPS 30%     *Please note that this is a verbal dictation therefore any spelling or grammatical errors are due to the "Early One" system interpretation.   Juel Burrow, DNP, AGNP-C Palliative Medicine Team [phone] Pager: [phone]

## 2022-06-03 ENCOUNTER — Inpatient Hospital Stay: Payer: Medicare Other

## 2022-06-03 DIAGNOSIS — J690 Pneumonitis due to inhalation of food and vomit: Secondary | ICD-10-CM | POA: Diagnosis not present

## 2022-06-03 DIAGNOSIS — K5981 Ogilvie syndrome: Secondary | ICD-10-CM | POA: Diagnosis not present

## 2022-06-03 DIAGNOSIS — I2510 Atherosclerotic heart disease of native coronary artery without angina pectoris: Secondary | ICD-10-CM

## 2022-06-03 DIAGNOSIS — I4892 Unspecified atrial flutter: Secondary | ICD-10-CM | POA: Diagnosis not present

## 2022-06-03 DIAGNOSIS — G629 Polyneuropathy, unspecified: Secondary | ICD-10-CM

## 2022-06-03 DIAGNOSIS — I2583 Coronary atherosclerosis due to lipid rich plaque: Secondary | ICD-10-CM

## 2022-06-03 DIAGNOSIS — M4802 Spinal stenosis, cervical region: Secondary | ICD-10-CM | POA: Diagnosis not present

## 2022-06-03 LAB — MAGNESIUM: Magnesium: 2.4 mg/dL (ref 1.7–2.4)

## 2022-06-03 LAB — CBC
HCT: 39.5 % (ref 39.0–52.0)
Hemoglobin: 12.4 g/dL — ABNORMAL LOW (ref 13.0–17.0)
MCH: 30.1 pg (ref 26.0–34.0)
MCHC: 31.4 g/dL (ref 30.0–36.0)
MCV: 95.9 fL (ref 80.0–100.0)
Platelets: 234 10*3/uL (ref 150–400)
RBC: 4.12 MIL/uL — ABNORMAL LOW (ref 4.22–5.81)
RDW: 12.6 % (ref 11.5–15.5)
WBC: 9.7 10*3/uL (ref 4.0–10.5)
nRBC: 0 % (ref 0.0–0.2)

## 2022-06-03 LAB — HEPARIN LEVEL (UNFRACTIONATED)
Heparin Unfractionated: 0.71 IU/mL — ABNORMAL HIGH (ref 0.30–0.70)
Heparin Unfractionated: 0.63 IU/mL (ref 0.30–0.70)
Heparin Unfractionated: 0.64 IU/mL (ref 0.30–0.70)

## 2022-06-03 LAB — PHOSPHORUS: Phosphorus: 2.8 mg/dL (ref 2.5–4.6)

## 2022-06-03 MED ORDER — OSMOLITE 1.5 CAL PO LIQD
1000.0000 mL | ORAL | Status: DC
  Administered 2022-06-03: 1000 mL

## 2022-06-03 MED ORDER — CYCLOBENZAPRINE HCL 10 MG PO TABS
5.0000 mg | ORAL_TABLET | Freq: Three times a day (TID) | ORAL | Status: DC | PRN
  Administered 2022-06-03 – 2022-06-06 (×2): 5 mg via NASOGASTRIC
  Filled 2022-06-03 (×3): qty 1

## 2022-06-03 NOTE — Consult Note (Signed)
ANTICOAGULATION CONSULT NOTE  Pharmacy Consult for IV Heparin Indication: atrial fibrillation  Patient Measurements: Height: '5\' 11"'$  (180.3 cm) Weight: 96 kg (211 lb 10.3 oz) IBW/kg (Calculated) : 75.3 Heparin Dosing Weight: 94.7 kg  Labs: Recent Labs    06/01/22 0740 06/01/22 1741 06/02/22 0205 06/03/22 0609 06/03/22 1451  HGB  --   --  14.5 12.4*  --   HCT  --   --  44.8 39.5  --   PLT  --   --  179 234  --   APTT 35  --   --   --   --   LABPROT 15.2  --   --   --   --   INR 1.2  --   --   --   --   HEPARINUNFRC  --    < > 0.91* 0.71* 0.63  CREATININE  --   --  1.27*  --   --    < > = values in this interval not displayed.     Estimated Creatinine Clearance: 61.3 mL/min (A) (by C-G formula based on SCr of 1.27 mg/dL (H)).   Medical History: Past Medical History:  Diagnosis Date   Alcohol abuse    drinks on weekend   Anemia    Anxiety    Arthritis    Cancer (HCC)    colon,throat   COPD (chronic obstructive pulmonary disease) ([place])    Coronary artery disease    Depression    Gout    Hypertension    Myocardial infarction ([place])    Neuromuscular disorder ([place])    Seizures ([place])    last 6 months ago   Stroke [facility])    multiple  left side weakness   Tremors of nervous system     Medications:  No anticoagulation prior to admission per my chart review  Assessment: Patient is a 71 y/o M with medical history as above who is admitted with SBO in setting of chronic Ogilvie's syndrome. Pharmacy consulted to initiate heparin infusion for atrial fibrillation.   Date Time  HL Rate/Comment 9/28 1741 0.57 Therapeutic x1; 1400 un/hr  9/29     0205    0.91    SUPRAtherapeutic   9/30     0609    0.71    SUPRAtherapeutic 9/30 1451 0.63 Therapeutic x1  Baseline aPTT and PT-INR are pending. Baseline CBC within normal limits.   Goal of Therapy:  Heparin level 0.3-0.7 units/ml Monitor platelets by anticoagulation protocol: Yes   Plan:  Heparin level therapeutic x1.   Continue heparin drip at 1100 units/hr Recheck HL in 8 hours Daily CBC with AM labs   [place] Pharmacist 06/03/2022 4:23 PM

## 2022-06-03 NOTE — Consult Note (Signed)
ANTICOAGULATION CONSULT NOTE  Pharmacy Consult for IV Heparin Indication: atrial fibrillation  Patient Measurements: Height: '5\' 11"'$  (180.3 cm) Weight: 96 kg (211 lb 10.3 oz) IBW/kg (Calculated) : 75.3 Heparin Dosing Weight: 94.7 kg  Labs: Recent Labs    06/01/22 0740 06/01/22 1741 06/02/22 0205 06/03/22 0609  HGB  --   --  14.5 12.4*  HCT  --   --  44.8 39.5  PLT  --   --  179 234  APTT 35  --   --   --   LABPROT 15.2  --   --   --   INR 1.2  --   --   --   HEPARINUNFRC  --  0.57 0.91* 0.71*  CREATININE  --   --  1.27*  --      Estimated Creatinine Clearance: 61.3 mL/min (A) (by C-G formula based on SCr of 1.27 mg/dL (H)).   Medical History: Past Medical History:  Diagnosis Date   Alcohol abuse    drinks on weekend   Anemia    Anxiety    Arthritis    Cancer (HCC)    colon,throat   COPD (chronic obstructive pulmonary disease) ([place])    Coronary artery disease    Depression    Gout    Hypertension    Myocardial infarction ([place])    Neuromuscular disorder ([place])    Seizures ([place])    last 6 months ago   Stroke [facility])    multiple  left side weakness   Tremors of nervous system     Medications:  No anticoagulation prior to admission per my chart review  Assessment: Patient is a 71 y/o M with medical history as above who is admitted with SBO in setting of chronic Ogilvie's syndrome. Pharmacy consulted to initiate heparin infusion for atrial fibrillation.   Date Time  HL Rate/Comment 9/28 1741 0.57 Therapeutic x1; 1400 un/hr  9/29     0205    0.91    SUPRAtherapeutic   9/30     0609    0.71    SUPRAtherapeutic    Baseline aPTT and PT-INR are pending. Baseline CBC within normal limits.   Goal of Therapy:  Heparin level 0.3-0.7 units/ml Monitor platelets by anticoagulation protocol: Yes   Plan:  9/30:  HL @ 0609 = 0.71, SUPRAtherapeutic Will decrease heparin drip to 1100 units/hr and recheck HL 8 hrs after rate change.   Lyla Jasek D Clinical  Pharmacist 06/03/2022 6:56 AM

## 2022-06-03 NOTE — Progress Notes (Signed)
Inpatient Follow-up/Progress Note   Patient ID: Calvin [name] is a 71 y.o. male.  Overnight Events / Subjective Findings Decompressive colonoscopy performed yesterday. Decompression tube unable to be placed, however, patient's abdomen distention greatly improved from colonoscopy decompression.  Abdomen still soft today with bowel sounds present, but hypoactive. Nursing notes one smeared BM. Tolerating trickle feeds. No abdominal pain, n/v No other acute GI complaints.  Review of Systems  Constitutional:  Negative for activity change, appetite change, chills, diaphoresis, fatigue, fever and unexpected weight change.  HENT:  Negative for trouble swallowing and voice change.   Respiratory:  Negative for shortness of breath and wheezing.   Cardiovascular:  Negative for chest pain, palpitations and leg swelling.  Gastrointestinal:  Positive for abdominal distention. Negative for abdominal pain, anal bleeding, blood in stool, constipation, diarrhea, nausea and vomiting.  Skin:  Negative for color change and pallor.  All other systems reviewed and are negative.    Medications  Current Facility-Administered Medications:    0.9 %  sodium chloride infusion, , Intravenous, Continuous, Wieting, Richard, MD, Last Rate: 50 mL/hr at 06/03/22 0500, Infusion Verify at 06/03/22 0500   acetaminophen (TYLENOL) tablet 650 mg, 650 mg, Oral, Q4H PRN **OR** acetaminophen (TYLENOL) 160 MG/5ML solution 650 mg, 650 mg, Per Tube, Q4H PRN, 650 mg at 06/03/22 0234 **OR** acetaminophen (TYLENOL) suppository 650 mg, 650 mg, Rectal, Q4H PRN, Ivor Costa, MD   albuterol (PROVENTIL) (2.5 MG/3ML) 0.083% nebulizer solution 2.5 mg, 2.5 mg, Nebulization, Q4H PRN, Ivor Costa, MD, 2.5 mg at 05/31/22 1624   budesonide (PULMICORT) nebulizer solution 0.5 mg, 0.5 mg, Nebulization, BID, Darel Hong D, NP, 0.5 mg at 06/02/22 2018   Chlorhexidine Gluconate Cloth 2 % PADS 6 each, 6 each, Topical, Q0600, Loletha Grayer, MD, 6  each at 06/02/22 0543   feeding supplement (OSMOLITE 1.5 CAL) liquid 1,000 mL, 1,000 mL, Per Tube, Q24H, Wieting, Richard, MD, Infusion Verify at 06/03/22 0500   feeding supplement (PROSource TF20) liquid 60 mL, 60 mL, Per Tube, Daily, Wieting, Richard, MD   free water 30 mL, 30 mL, Per Tube, Q4H, Wieting, Richard, MD, 30 mL at 06/03/22 0201   gabapentin (NEURONTIN) 250 MG/5ML solution 400 mg, 400 mg, Per Tube, Q12H, Darel Hong D, NP, 400 mg at 06/02/22 2120   heparin ADULT infusion 100 units/mL (25000 units/273m), 1,200 Units/hr, Intravenous, Continuous, MDarrick Penna RPH, Last Rate: 12 mL/hr at 06/03/22 0500, 1,200 Units/hr at 06/03/22 0500   hydrALAZINE (APRESOLINE) injection 5 mg, 5 mg, Intravenous, Q2H PRN, NIvor Costa MD, 5 mg at 06/01/22 1606   ipratropium-albuterol (DUONEB) 0.5-2.5 (3) MG/3ML nebulizer solution 3 mL, 3 mL, Nebulization, BID, Wieting, Richard, MD, 3 mL at 06/02/22 2018   levETIRAcetam (KEPPRA) IVPB 1000 mg/100 mL premix, 1,000 mg, Intravenous, Q12H, Bhagat, Srishti L, MD, Paused at 06/02/22 2038   lidocaine (XYLOCAINE) 2 % viscous mouth solution 15 mL, 15 mL, Mouth/Throat, Q6H PRN, Wieting, Richard, MD   lidocaine HCl (PF) (XYLOCAINE) 2 % injection 10 mL, 10 mL, Other, Once, [place], CHilton Cork MD   lisinopril (ZESTRIL) tablet 10 mg, 10 mg, Per Tube, Daily, CBenita Gutter RPH, 10 mg at 06/01/22 1524   LORazepam (ATIVAN) injection 0.5 mg, 0.5 mg, Intravenous, Q8H PRN, NIvor Costa MD   LORazepam (ATIVAN) injection 1 mg, 1 mg, Intravenous, QHS, Bhagat, Srishti L, MD, 1 mg at 06/02/22 2125   LORazepam (ATIVAN) injection 1 mg, 1 mg, Intravenous, Q2H PRN, NIvor Costa MD, 1 mg at 05/31/22 1741  metoprolol tartrate (LOPRESSOR) injection 5 mg, 5 mg, Intravenous, Q1H PRN, Leslye Peer, Richard, MD   metoprolol tartrate (LOPRESSOR) tablet 12.5 mg, 12.5 mg, Per Tube, BID, Leslye Peer, Richard, MD, 12.5 mg at 06/02/22 2125   morphine (PF) 2 MG/ML injection 0.5 mg, 0.5 mg,  Intravenous, Q4H PRN, Loletha Grayer, MD, 0.5 mg at 06/02/22 2126   multivitamin liquid 15 mL, 15 mL, Per Tube, Daily, Wieting, Richard, MD   nicotine (NICODERM CQ - dosed in mg/24 hours) patch 21 mg, 21 mg, Transdermal, Daily, Ivor Costa, MD, 21 mg at 06/02/22 1020   nitroGLYCERIN (NITROSTAT) SL tablet 0.4 mg, 0.4 mg, Sublingual, Q5 min PRN, Kirkey, Katy L, NP, 0.4 mg at 05/30/22 0309   ondansetron (ZOFRAN) injection 4 mg, 4 mg, Intravenous, Q8H PRN, Ivor Costa, MD   pantoprazole (PROTONIX) injection 40 mg, 40 mg, Intravenous, Q12H, Ivor Costa, MD, 40 mg at 06/02/22 2125   phenol (CHLORASEPTIC) mouth spray 1 spray, 1 spray, Mouth/Throat, PRN, Rometta Emery, RN, 1 spray at 05/30/22 1456   piperacillin-tazobactam (ZOSYN) IVPB 3.375 g, 3.375 g, Intravenous, Q8H, Benita Gutter, RPH, Stopped at 06/03/22 0120   polyethylene glycol (MIRALAX / GLYCOLAX) packet 17 g, 17 g, Per Tube, BID, Wieting, Richard, MD, 17 g at 06/02/22 2120   senna-docusate (Senokot-S) tablet 1 tablet, 1 tablet, Oral, QHS PRN, Ivor Costa, MD   trolamine salicylate (ASPERCREME) 10 % cream, , Topical, BID PRN, [place], Katy L, NP, Given at 05/30/22 0816   valproate (DEPACON) 250 mg in dextrose 5 % 50 mL IVPB, 250 mg, Intravenous, Q6H, Bhagat, Srishti L, MD, Last Rate: 52.5 mL/hr at 06/03/22 0541, 250 mg at 06/03/22 0541  sodium chloride 50 mL/hr at 06/03/22 0500   heparin 1,200 Units/hr (06/03/22 0500)   levETIRAcetam Stopped (06/02/22 2038)   piperacillin-tazobactam (ZOSYN)  IV Stopped (06/03/22 0120)   valproate sodium 250 mg (06/03/22 0541)    acetaminophen **OR** acetaminophen (TYLENOL) oral liquid 160 mg/5 mL **OR** acetaminophen, albuterol, hydrALAZINE, lidocaine, LORazepam, LORazepam, metoprolol tartrate, morphine injection, nitroGLYCERIN, ondansetron (ZOFRAN) IV, phenol, senna-docusate, trolamine salicylate   Objective    Vitals:   06/03/22 0200 06/03/22 0300 06/03/22 0400 06/03/22 0500  BP: 130/77 125/74 116/73  107/70  Pulse: 81 85 81 82  Resp: '18 20 13 14  '$ Temp:      TempSrc:      SpO2: 99% 97% 99% 96%  Weight:      Height:         Physical Exam Vitals and nursing note reviewed.  Constitutional:      General: He is not in acute distress.    Appearance: He is ill-appearing (chronically). He is not toxic-appearing or diaphoretic.  HENT:     Head: Normocephalic and atraumatic.     Nose: Nose normal.     Comments: NGT in place with feeds running    Mouth/Throat:     Mouth: Mucous membranes are moist.     Pharynx: Oropharynx is clear.  Eyes:     General: No scleral icterus.    Extraocular Movements: Extraocular movements intact.  Cardiovascular:     Rate and Rhythm: Normal rate and regular rhythm.     Heart sounds: Normal heart sounds. No murmur heard.    No friction rub. No gallop.  Pulmonary:     Effort: Pulmonary effort is normal. No respiratory distress.     Breath sounds: Normal breath sounds. No wheezing, rhonchi or rales.  Abdominal:     General: There is distension (mild).  Palpations: Abdomen is soft.     Tenderness: There is no abdominal tenderness. There is no guarding or rebound.     Comments: Bowel sounds present in all 4 quadrants. Hypoactive. Not high pitched  Musculoskeletal:     Cervical back: Neck supple.     Right lower leg: No edema.     Left lower leg: No edema.  Skin:    General: Skin is warm and dry.     Coloration: Skin is not jaundiced or pale.  Neurological:     Mental Status: He is alert.     Comments: Answers questions appropriately. Difficult to understand. Falls asleep often  Psychiatric:        Mood and Affect: Mood normal.      Laboratory Data Recent Labs  Lab 05/28/22 1208 05/28/22 1817 05/30/22 0259 05/31/22 0342 06/02/22 0205  WBC 9.8   < > 8.0 7.3 8.2  HGB 14.9   < > 12.3* 13.3 14.5  HCT 47.0   < > 39.0 41.6 44.8  PLT 206   < > 170 169 179  NEUTOPHILPCT 87  --   --   --   --   LYMPHOPCT 9  --   --   --   --   MONOPCT 4   --   --   --   --   EOSPCT 0  --   --   --   --    < > = values in this interval not displayed.   Recent Labs  Lab 05/28/22 1208 05/29/22 0742 05/30/22 0259 05/31/22 0342 06/02/22 0205  NA 140 141 141 144 142  K 4.6 4.3 3.7 3.7 3.6  CL 107 108 111 110 110  CO2 '22 26 24 '$ 21* 18*  BUN '13 13 18 18 15  '$ CREATININE 1.18 1.26* 1.41* 1.22 1.27*  CALCIUM 9.2 9.1 8.4* 8.9 8.7*  PROT 7.8 7.3  --   --   --   BILITOT 1.0 0.8  --   --   --   ALKPHOS 64 61  --   --   --   ALT 17 13  --   --   --   AST 28 18  --   --   --   GLUCOSE 122* 112* 80 80 158*   Recent Labs  Lab 05/28/22 1208 05/29/22 0742 06/01/22 0740  INR 1.0 1.0 1.2      Imaging Studies: DG Abd 1 View  Result Date: 06/02/2022 CLINICAL DATA:  Pseudo-obstruction of colon per ordering notes. Patient complaining of generalized abdominal pain. EXAM: ABDOMEN - 1 VIEW COMPARISON:  None Available. FINDINGS: Nasogastric tube with the tip projecting over the stomach. Severe dilatation of the colon as can be seen with pseudo-obstruction/colonic ileus. No evidence of pneumoperitoneum, portal venous gas or pneumatosis. No pathologic calcifications along the expected course of the ureters. No acute osseous abnormality. IMPRESSION: Severe dilatation of the colon as can be seen with pseudo-obstruction/colonic ileus. Nasogastric tube with the tip projecting over the stomach. Electronically Signed   By: Kathreen Devoid M.D.   On: 06/02/2022 09:46   DG Chest Port 1 View  Result Date: 06/01/2022 CLINICAL DATA:  Provided history: Shortness of breath. Audible wheezing on exam. EXAM: PORTABLE CHEST 1 VIEW COMPARISON:  Prior chest radiographs 05/31/2022 and earlier. FINDINGS: An enteric tube passes below the level of the left hemidiaphragm with tip excluded from the field of view. Shallow inspiration radiograph. The cardiomediastinal silhouette is unchanged. No appreciable airspace consolidation  or pulmonary edema. No evidence of pleural effusion or  pneumothorax. Persistent gaseous distension of partially imaged bowel loops within the upper abdomen. IMPRESSION: Shallow inspiration radiograph. No evidence of acute cardiopulmonary abnormality. Persistent gaseous distension of partially imaged bowel loops within the upper abdomen. Electronically Signed   By: Kellie Simmering D.O.   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 # SBO- resolved # COPD # HTN # HLD  Plan:  tolerating trickle feeds Needs consistent bowel regimen Miralax BID and docusate bid to start Avoid agents that can slow gut motility- specifically opioids. Avoid bentyl/levsin/anti cholinergics, anti diarrheals etc Electrolyte correction/management as per primary; IV hydration KUB today - distention vastly improved Goals of care/palliation discussions warranted at this time as he is a poor surgical candidate  Needs aggressive PT and movement. OOBTC, ambulation High risk for recurrence of Ogilvie   Supportive care and management of other medical comorbidities as per primary team  I personally performed the service.  Management of other medical comorbidities as per primary team  Thank you for allowing [place] to participate in this patient's care. Please don't hesitate to call if any questions or concerns arise.   Annamaria Helling, DO [facility]  Portions of the record may have been created with voice recognition software. Occasional wrong-word or 'sound-a-like' substitutions may have occurred due to the inherent limitations of voice recognition software.  Read the chart carefully and recognize, using context, where substitutions may have  occurred.

## 2022-06-03 NOTE — Evaluation (Signed)
Occupational Therapy Re-Evaluation Patient Details Name: Calvin [name] MRN: [id number] DOB: 11-16-1950 Today's Date: 06/03/2022   History of Present Illness Pt is 10 YOM admitted for small bowel obstruction and abdominal distention. PMH includes: CVA w L sided weakness, dCHF, seizures, CAD, HLD, CKD3a, COPD, tobacco abuse, depression, anxiety, and ogilive's syndrome.   Clinical Impression   Mr Riffe was seen for OT re-evaluation this date following transition to higher level of care. Upon arrival pt seated in recliner, son at bedside. Pt currently requires MAX A don B socks reclined in chair. MAX A x2 + RW sit<>stand from chair height, unable to grip RW with LUE (Chronic L hemi) and difficulty advancing LLE to achieve upright posture. Tolerates standing ~90 seconds. Reviewed HEP, pt with good recall. Pt would benefit from skilled OT to address noted impairments and functional limitations (see below for any additional details). Upon hospital discharge, recommend STR to maximize pt safety and return to PLOF.   Recommendations for follow up therapy are one component of a multi-disciplinary discharge planning process, led by the attending physician.  Recommendations may be updated based on patient status, additional functional criteria and insurance authorization.   Follow Up Recommendations  Skilled nursing-short term rehab (<3 hours/day)    Assistance Recommended at Discharge Intermittent Supervision/Assistance  [name];[name];[name];Assist for transportation;Direct supervision/assist for medications management;Assistance with cooking/housework    Functional Status Assessment  Patient has had a recent decline in their functional status and/or demonstrates limited ability to make significant improvements in function in a reasonable and predictable  amount of time  Equipment Recommendations  Other (comment) (defer)    Recommendations for Other Services       Precautions / Restrictions Precautions Precautions: Fall;Other (comment) (seizure) Restrictions Weight Bearing Restrictions: No      Mobility Bed Mobility               General bed mobility comments: received and left in chair    Transfers Overall transfer level: Needs assistance Equipment used: Rolling walker (2 wheels) Transfers: Sit to/from Stand Sit to Stand: Max assist, +2 physical assistance                  Balance Overall balance assessment: Needs assistance Sitting-balance support: Bilateral upper extremity supported, Feet unsupported Sitting balance-Leahy Scale: Fair     Standing balance support: Single extremity supported Standing balance-Leahy Scale: Poor                             ADL either performed or assessed with clinical judgement   ADL Overall ADL's : Needs assistance/impaired                                       General ADL Comments: MAX A don B socks reclined in chair. MAX A x2 + RW for simulated BSC t/f      Pertinent Vitals/Pain Pain Assessment Pain Assessment: No/denies pain     Hand Dominance Right   Extremity/Trunk Assessment Upper Extremity Assessment Upper Extremity Assessment: LUE deficits/detail LUE Deficits / Details: hx of CVA however reports weaker grip than baseline   Lower Extremity Assessment Lower Extremity Assessment: LLE deficits/detail LLE Deficits / Details: L sided weakness due to prior stroke  Communication Communication Communication: HOH   Cognition Arousal/Alertness: Awake/alert Behavior During Therapy: Flat affect Overall Cognitive Status: History of cognitive impairments - at baseline                                 General Comments: eyes largely closed t/o session however answers questions with prompting from son                 Home Living Family/patient expects to be discharged to:: Other (Comment) Engineer, agricultural LTC)                             Home Equipment: Wheelchair - Publishing copy (2 wheels)          Prior Functioning/Environment Prior Level of Function : Needs assist             Mobility Comments: son in room reports pt using RW or w/c for mobility at facility          OT Problem List: Decreased strength;Decreased cognition;Pain;Impaired balance (sitting and/or standing);Decreased safety awareness;Decreased activity tolerance;Decreased knowledge of use of DME or AE      OT Treatment/Interventions: Self-care/ADL training;Therapeutic exercise;Patient/family education;Energy conservation;Therapeutic activities;DME and/or AE instruction;Cognitive remediation/compensation    OT Goals(Current goals can be found in the care plan section) Acute Rehab OT Goals Patient Stated Goal: to eat OT Goal Formulation: With patient/family Time For Goal Achievement: 06/17/22 Potential to Achieve Goals: Fair ADL Goals Pt Will Perform Grooming: with set-up;with supervision;sitting Pt Will Perform Lower Body Dressing: with mod assist;sitting/lateral leans Pt Will Transfer to Toilet: with mod assist;with +2 assist;stand pivot transfer;bedside commode Pt Will Perform Toileting - Clothing Manipulation and hygiene: with min guard assist;sitting/lateral leans  OT Frequency: Min 2X/week    Co-evaluation              AM-PAC OT "6 Clicks" Daily Activity     Outcome Measure Help from another person eating meals?: None Help from another person taking care of personal grooming?: A Little Help from another person toileting, which includes using toliet, bedpan, or urinal?: A Lot Help from another person bathing (including washing, rinsing, drying)?: A Lot Help from another person to put on and taking off regular upper body clothing?: A Lot Help from another person to put on and taking off  regular lower body clothing?: A Lot 6 Click Score: 15   End of Session Equipment Utilized During Treatment: Rolling walker (2 wheels)  Activity Tolerance: Patient tolerated treatment well Patient left: in chair;with call bell/phone within reach;with chair alarm set;with family/visitor present  OT Visit Diagnosis: Muscle weakness (generalized) (M62.81);Unsteadiness on feet (R26.81)                Time: 3664-4034 OT Time Calculation (min): 13 min Charges:  OT General Charges $OT Visit: 1 Visit OT Evaluation $OT Re-eval: 1 Re-eval  Dessie Coma, M.S. OTR/L  06/03/22, 12:58 PM  ascom [phone]

## 2022-06-03 NOTE — Progress Notes (Signed)
Progress Note   Patient: Calvin [name] UMP:[id number] DOB: 1950/09/18 DOA: 05/28/2022     6 DOS: the patient was seen and examined on 06/03/2022   Brief hospital course: Taken from H&P.  Calvin [name] is a 71 y.o. male with medical history significant of Ogilvie's syndrome, ileus, hypertension, hyperlipidemia, COPD, stroke with mild left-sided weakness, GERD, gout, depression with anxiety, seizure, CAD, MI,  alcohol abuse, CKD stage IIIa, dCHF, colon cancer, thyroid cancer, who presents from his facility with Nausea, vomiting, abdominal distention, abdominal pain for 2 days. Found to have new small bowel obstruction on CT abdomen with transition point in the right lower abdomen suspicious for adhesion. General surgery was consulted and NG tube was placed.  Patient also developed left-sided tingling and numbness and facial droop while in ED, has an history of left-sided stroke with residual weakness.  Symptoms resolved within few minutes. CT head and MRI brain was negative for any acute intracranial abnormality and do reflect a chronic changes. 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 Neurology was consulted-patient to complete stroke work-up.  9/25: Labs with mildly elevated creatinine at 1.26 with baseline seems to be around 1.1-1.2.  Lipid panel normal with LDL of 57 CBC with mild leukocytosis at 11.1.  Hemoglobin stable,A1c 5.8 Repeat abdominal x-ray with stable obstructive findings and no other acute abnormality.  Continued to have significant colonic distention.  Passing flatus, minimum secretions with NG tube. general surgery would like to continue with conservative management with NG tube. Speech and cognition appears to be at baseline per speech evaluation. Patient uses quite a bit of opioids for chronic pain which can be contributory.  Try avoiding opioids if possible. We can try a Toradol as needed for pain with a close monitoring of renal function.  9/26: Patient  overnight complained about chest pain, on evaluation by night cross coverage, he was complaining more of generalized aches and pain but did responded to nitroglycerin x1.  EKG with sinus tachycardia, troponin 20>> 23. Patient was given a dose of morphine.  Renal function with slight worsening of creatinine to 1.41 on 05/30/2022.  We will avoid Toradol and pain will be managed with morphine although can contribute to his slower bowel function.  Repeat abdominal x-ray with air-filled distention of large and small bowel loops favoring ileus. CT scan on 06/01/2022 did not show any signs of obstruction, small bowel last dilated.  Still had dilation of the colon.  The patient was transferred to the stepdown unit for neostigmine dosing on 05/31/2022 and 06/01/2022.    06/02/2022 had colonoscopy to decompress air.  06/03/2022 will have swallow evaluation by speech therapy to see if we can remove his NG tube   Assessment and Plan: Ogilvie's syndrome General surgery does not think this is bowel obstruction.  Gastroenterology gave neostigmine infusion on 05/31/2022 and on 06/01/22.  Decompressive colonoscopy procedure on 06/02/2022.  Currently on tube feedings.  Speech therapy to evaluate to see if he can pass a swallow evaluation.  Paroxysmal atrial flutter (HCC) Continue metoprolol.  Continue heparin drip for now.  Hopefully can switch over to Eliquis if passes swallow evaluation.  Was in normal sinus rhythm this morning.  Aspiration pneumonia (HCC) Continue 5 days of Zosyn  Cervical spinal stenosis MRI showing multilevel cervical spondylosis most pronounced at C3-C4 where moderate to severe canal stenosis and severe bilateral foraminal stenosis.  Patient is not a great surgical candidate at this point.  Respiratory distress Respiratory distress on 06/01/2022 with upper airway  congestion.  Improved with Decadron and racemic epinephrine infusion.  Currently not on oxygen.  Chronic diastolic CHF  (congestive heart failure) (HCC) -Last EF 50% -Hold Lasix -Watch closely with IV fluids  Seizures (HCC) Seizure -Seizure precaution -When necessary Ativan for seizure -IV Keppra and Depakote.    HTN (hypertension) Continue metoprolol via NG tube.  CAD (coronary artery disease) No chest pain.  Troponin negative x2 -Hold aspirin and Zocor.  Continue metoprolol  HLD (hyperlipidemia) - Hold Zocor  Chronic kidney disease, stage 3a (HCC) Last creatinine 1.27 with a GFR of 60  COPD (chronic obstructive pulmonary disease) (HCC) Continue nebulizers  Coffee ground emesis No recurrence of vomiting.  Watch closely with blood thinner.  Hemoglobin dipped down to 12.4 today.  Tobacco abuse -nicotine patch  Depression with anxiety Hold home oral Xanax and Zoloft -As needed IV Ativan for anxiety  Neuropathy Looks more comfortable after restarting gabapentin        Subjective: Patient seen sitting in the chair.  Patient more alert this morning as per nursing staff.  Answer a few questions but fell back to sleep easily.  Abdomen less distended than yesterday.   Physical Exam: Vitals:   06/03/22 0900 06/03/22 1000 06/03/22 1100 06/03/22 1200  BP: 116/70 116/79    Pulse: 70 76 (!) 57 76  Resp: '16 20 11 '$ (!) 22  Temp:      TempSrc:      SpO2: 95% 99% 97% 98%  Weight:      Height:       Physical Exam HENT:     Head: Normocephalic.     Mouth/Throat:     Pharynx: No oropharyngeal exudate.  Eyes:     General: Lids are normal.     Conjunctiva/sclera: Conjunctivae normal.  Cardiovascular:     Rate and Rhythm: Normal rate and regular rhythm.     Heart sounds: Normal heart sounds, S1 normal and S2 normal.  Pulmonary:     Breath sounds: No decreased breath sounds, wheezing, rhonchi or rales.  Abdominal:     Palpations: Abdomen is soft.     Tenderness: There is abdominal tenderness.     Comments: Less distention than yesterday.  Musculoskeletal:     Right lower leg:  Swelling present.     Left lower leg: Swelling present.  Skin:    General: Skin is warm.     Findings: No rash.  Neurological:     Mental Status: He is alert.     Data Reviewed: Abdominal x-ray shows significant decrease  in colonic distention Hemoglobin 12.4  Family Communication: Updated daughter on the phone  Disposition: Status is: Inpatient Remains inpatient appropriate because: Still needs to pass swallow evaluation.  PT and OT consultations.  Planned Discharge Destination: Rehab    Time spent: 27 minutes Has discussed with nursing staff and gastroenterology  Author: Loletha Grayer, MD 06/03/2022 2:19 PM  For on call review [email or web address].

## 2022-06-03 NOTE — Evaluation (Signed)
Physical Therapy Evaluation Patient Details Name: Calvin [name] MRN: [id number] DOB: [birth year] Today's Date: 06/03/2022  History of Present Illness  Pt is 30 YOM admitted for small bowel obstruction and abdominal distention. PMH includes: CVA w L sided weakness, dCHF, seizures, CAD, HLD, CKD3a, COPD, tobacco abuse, depression, anxiety, and ogilive's syndrome.    Clinical Impression  Pt received seated in recliner upon arrival to room and pt agreeable to therapy.  Pt performed well with STS and only required min-modA for first two attempts.  Pt unable to follow sequencing directions given by therapist, so more attempts made to assist pt back into bed.  Pt progressively more fatigued with each STS and becoming more agitated with therapist.  Pt did perform final STS attempt with modA on L side to assist with weaker side, and was able to navigate and scoot foot in order to properly place himself at EOB.  Pt then attempted to place himself in bed, however needed modA from therapist for LE navigation.  Pt left with nurse in room and all needs met.  Pt encouraged to continue working with therapy in order to improve strength, pain and overall QoL.  Pt agreeable to recommendations.  Current discharge plans to SNF are most appropriate at this time.  Pt will continue to benefit from skilled therapy in order to address deficits listed below.        Recommendations for follow up therapy are one component of a multi-disciplinary discharge planning process, led by the attending physician.  Recommendations may be updated based on patient status, additional functional criteria and insurance authorization.  Follow Up Recommendations Skilled nursing-short term rehab (<3 hours/day)      Assistance Recommended at Discharge Frequent or constant Supervision/Assistance  Patient can return home with the following  Assistance with cooking/housework;Direct supervision/assist for financial management;A lot of help with  walking and/or transfers;Assist for transportation;Direct supervision/assist for medications management;[name]    Equipment Recommendations None recommended by PT  Recommendations for Other Services       Functional Status Assessment Patient has had a recent decline in their functional status and demonstrates the ability to make significant improvements in function in a reasonable and predictable amount of time.     Precautions / Restrictions Precautions Precautions: Fall;Other (comment) (seizure) Restrictions Weight Bearing Restrictions: No      Mobility  Bed Mobility Overal bed mobility: Needs Assistance Bed Mobility: Sit to Supine       Sit to supine: Mod assist (modA needed for LE navigation into bed.)   General bed mobility comments: received in chair, left in bed.    Transfers Overall transfer level: Needs assistance Equipment used: Rolling walker (2 wheels) Transfers: Sit to/from Stand Sit to Stand: Min assist, Mod assist           General transfer comment: verbal cues for hand placement and only required min-modA for first two attempts and heavy modA for final attempt.    Ambulation/Gait         Gait velocity: decr     General Gait Details: deferred due to pt not following therapist instructions.  Stairs            Wheelchair Mobility    Modified Rankin (Stroke Patients Only)       Balance Overall balance assessment: Needs assistance Sitting-balance support: Bilateral upper extremity supported, Feet unsupported Sitting balance-Leahy Scale: Fair     Standing balance support: Bilateral upper extremity supported, During functional activity, Reliant on assistive  device for balance Standing balance-Leahy Scale: Poor                               Pertinent Vitals/Pain Pain Assessment Pain Assessment: Faces Faces Pain Scale: Hurts whole lot Pain Location: reports pain all over. burning  pain in L hand, headache Pain Descriptors / Indicators: Burning, Constant, Headache Pain Intervention(s): Limited activity within patient's tolerance, Monitored during session    Home Living Family/patient expects to be discharged to:: Other (Comment) Engineer, agricultural LTC)                 Home Equipment: Wheelchair - Publishing copy (2 wheels)      Prior Function Prior Level of Function : Needs assist                     Hand Dominance   Dominant Hand: Right    Extremity/Trunk Assessment   Upper Extremity Assessment Upper Extremity Assessment: Generalized weakness;LUE deficits/detail LUE Deficits / Details: hx of CVA however reports weaker grip than baseline    Lower Extremity Assessment Lower Extremity Assessment: Generalized weakness;LLE deficits/detail LLE Deficits / Details: L sided weakness due to prior stroke       Communication   Communication: HOH  Cognition Arousal/Alertness: Awake/alert Behavior During Therapy: Flat affect Overall Cognitive Status: History of cognitive impairments - at baseline                                 General Comments: Pt able to open eyes effectively and answer questions.        General Comments      Exercises     Assessment/Plan    PT Assessment Patient needs continued PT services  PT Problem List Decreased strength;Decreased range of motion;Decreased activity tolerance;Decreased balance;Decreased mobility;Decreased cognition;Decreased knowledge of use of DME;Pain       PT Treatment Interventions DME instruction;Therapeutic exercise;Gait training;Balance training;Neuromuscular re-education;Functional mobility training;Cognitive remediation;Therapeutic activities;Patient/family education    PT Goals (Current goals can be found in the Care Plan section)  Acute Rehab PT Goals PT Goal Formulation: Patient unable to participate in goal setting    Frequency Min 2X/week     Co-evaluation                AM-PAC PT "6 Clicks" Mobility  Outcome Measure Help needed turning from your back to your side while in a flat bed without using bedrails?: A Little Help needed moving from lying on your back to sitting on the side of a flat bed without using bedrails?: A Little Help needed moving to and from a bed to a chair (including a wheelchair)?: A Lot Help needed standing up from a chair using your arms (e.g., wheelchair or bedside chair)?: A Lot Help needed to walk in hospital room?: A Lot Help needed climbing 3-5 steps with a railing? : A Lot 6 Click Score: 14    End of Session Equipment Utilized During Treatment: Gait belt Activity Tolerance: Patient tolerated treatment well Patient left: in bed;with call bell/phone within reach;with bed alarm set;with nursing/sitter in room Nurse Communication: Mobility status PT Visit Diagnosis: Muscle weakness (generalized) (M62.81);Difficulty in walking, not elsewhere classified (R26.2)    Time: 7290-2111 PT Time Calculation (min) (ACUTE ONLY): 24 min   Charges:   PT Evaluation $PT Eval Low Complexity: 1 Low PT Treatments $Therapeutic Activity: 8-22 mins  Gwenlyn Saran, PT, DPT 06/03/22, 5:37 PM

## 2022-06-04 ENCOUNTER — Inpatient Hospital Stay: Payer: Medicare Other

## 2022-06-04 DIAGNOSIS — M4802 Spinal stenosis, cervical region: Secondary | ICD-10-CM | POA: Diagnosis not present

## 2022-06-04 DIAGNOSIS — J690 Pneumonitis due to inhalation of food and vomit: Secondary | ICD-10-CM | POA: Diagnosis not present

## 2022-06-04 DIAGNOSIS — I4892 Unspecified atrial flutter: Secondary | ICD-10-CM | POA: Diagnosis not present

## 2022-06-04 DIAGNOSIS — K5981 Ogilvie syndrome: Secondary | ICD-10-CM | POA: Diagnosis not present

## 2022-06-04 LAB — CBC
HCT: 40.2 % (ref 39.0–52.0)
Hemoglobin: 12.3 g/dL — ABNORMAL LOW (ref 13.0–17.0)
MCH: 29.8 pg (ref 26.0–34.0)
MCHC: 30.6 g/dL (ref 30.0–36.0)
MCV: 97.3 fL (ref 80.0–100.0)
Platelets: 257 10*3/uL (ref 150–400)
RBC: 4.13 MIL/uL — ABNORMAL LOW (ref 4.22–5.81)
RDW: 12.7 % (ref 11.5–15.5)
WBC: 6.5 10*3/uL (ref 4.0–10.5)
nRBC: 0 % (ref 0.0–0.2)

## 2022-06-04 LAB — PHOSPHORUS: Phosphorus: 3.9 mg/dL (ref 2.5–4.6)

## 2022-06-04 LAB — BASIC METABOLIC PANEL
Anion gap: 11 (ref 5–15)
BUN: 19 mg/dL (ref 8–23)
CO2: 23 mmol/L (ref 22–32)
Calcium: 8.5 mg/dL — ABNORMAL LOW (ref 8.9–10.3)
Chloride: 113 mmol/L — ABNORMAL HIGH (ref 98–111)
Creatinine, Ser: 1.2 mg/dL (ref 0.61–1.24)
GFR, Estimated: >60 mL/min (ref >60)
Glucose, Bld: 116 mg/dL — ABNORMAL HIGH (ref 70–99)
Potassium: 3.8 mmol/L (ref 3.5–5.1)
Sodium: 147 mmol/L — ABNORMAL HIGH (ref 135–145)

## 2022-06-04 LAB — HEPARIN LEVEL (UNFRACTIONATED): Heparin Unfractionated: 0.59 IU/mL (ref 0.30–0.70)

## 2022-06-04 LAB — MAGNESIUM: Magnesium: 2.6 mg/dL — ABNORMAL HIGH (ref 1.7–2.4)

## 2022-06-04 MED ORDER — BISACODYL 5 MG PO TBEC: 10.0000 mg | DELAYED_RELEASE_TABLET | Freq: Every day | ORAL | Status: DC | PRN

## 2022-06-04 MED ORDER — BISACODYL 10 MG RE SUPP
10.0000 mg | Freq: Once | RECTAL | Status: AC
  Administered 2022-06-04: 10 mg via RECTAL
  Filled 2022-06-04: qty 1

## 2022-06-04 MED ORDER — POLYVINYL ALCOHOL 1.4 % OP SOLN
2.0000 [drp] | OPHTHALMIC | Status: DC | PRN
  Administered 2022-06-10 – 2022-06-18 (×7): 2 [drp] via OPHTHALMIC
  Filled 2022-06-04: qty 15

## 2022-06-04 MED ORDER — CIPROFLOXACIN HCL 0.3 % OP SOLN
1.0000 [drp] | OPHTHALMIC | Status: DC
  Administered 2022-06-04 – 2022-06-14 (×46): 1 [drp] via OPHTHALMIC
  Filled 2022-06-04 (×2): qty 2.5

## 2022-06-04 MED ORDER — SODIUM CHLORIDE 0.45 % IV SOLN: INTRAVENOUS | Status: DC

## 2022-06-04 NOTE — Consult Note (Signed)
ANTICOAGULATION CONSULT NOTE  Pharmacy Consult for IV Heparin Indication: atrial fibrillation  Patient Measurements: Height: '5\' 11"'$  (180.3 cm) Weight: 96 kg (211 lb 10.3 oz) IBW/kg (Calculated) : 75.3 Heparin Dosing Weight: 94.7 kg  Labs: Recent Labs    06/01/22 0740 06/01/22 1741 06/02/22 0205 06/03/22 0609 06/03/22 1451 06/03/22 2319  HGB  --   --  14.5 12.4*  --   --   HCT  --   --  44.8 39.5  --   --   PLT  --   --  179 234  --   --   APTT 35  --   --   --   --   --   LABPROT 15.2  --   --   --   --   --   INR 1.2  --   --   --   --   --   HEPARINUNFRC  --    < > 0.91* 0.71* 0.63 0.64  CREATININE  --   --  1.27*  --   --   --    < > = values in this interval not displayed.     Estimated Creatinine Clearance: 61.3 mL/min (A) (by C-G formula based on SCr of 1.27 mg/dL (H)).   Medical History: Past Medical History:  Diagnosis Date   Alcohol abuse    drinks on weekend   Anemia    Anxiety    Arthritis    Cancer (HCC)    colon,throat   COPD (chronic obstructive pulmonary disease) ([place])    Coronary artery disease    Depression    Gout    Hypertension    Myocardial infarction ([place])    Neuromuscular disorder ([place])    Seizures ([place])    last 6 months ago   Stroke [facility])    multiple  left side weakness   Tremors of nervous system     Medications:  No anticoagulation prior to admission per my chart review  Assessment: Patient is a 71 y/o M with medical history as above who is admitted with SBO in setting of chronic Ogilvie's syndrome. Pharmacy consulted to initiate heparin infusion for atrial fibrillation.   Date Time  HL Rate/Comment 9/28 1741 0.57 Therapeutic x1; 1400 un/hr  9/29     0205    0.91    SUPRAtherapeutic   9/30     0609    0.71    SUPRAtherapeutic 9/30 1451 0.63 Therapeutic x1 9/30     2319    0.64    Therapeutic X 2   Baseline aPTT and PT-INR are pending. Baseline CBC within normal limits.   Goal of Therapy:  Heparin level 0.3-0.7  units/ml Monitor platelets by anticoagulation protocol: Yes   Plan:  09/30:  HL @ 8676 = 0.64, therapeutic X 2 Will continue pt on current rate and recheck HL on 10/01 @ 0500.   [place] Pharmacist 06/04/2022 12:39 AM

## 2022-06-04 NOTE — Progress Notes (Signed)
Patient Alert x4, on RA. Complaints of generalized pain. Abdominal distention noted. Tube Feeds stopped, xray ordered. Suppository given. SLP pending. Report given to [facility].

## 2022-06-04 NOTE — Consult Note (Signed)
ANTICOAGULATION CONSULT NOTE  Pharmacy Consult for IV Heparin Indication: atrial fibrillation  Patient Measurements: Height: '5\' 11"'$  (180.3 cm) Weight: 96 kg (211 lb 10.3 oz) IBW/kg (Calculated) : 75.3 Heparin Dosing Weight: 94.7 kg  Labs: Recent Labs    06/01/22 0740 06/01/22 1741 06/02/22 0205 06/03/22 0609 06/03/22 1451 06/03/22 2319 06/04/22 0444  HGB  --    < > 14.5 12.4*  --   --  12.3*  HCT  --   --  44.8 39.5  --   --  40.2  PLT  --   --  179 234  --   --  257  APTT 35  --   --   --   --   --   --   LABPROT 15.2  --   --   --   --   --   --   INR 1.2  --   --   --   --   --   --   HEPARINUNFRC  --    < > 0.91* 0.71* 0.63 0.64 0.59  CREATININE  --   --  1.27*  --   --   --  1.20   < > = values in this interval not displayed.     Estimated Creatinine Clearance: 64.8 mL/min (by C-G formula based on SCr of 1.2 mg/dL).   Medical History: Past Medical History:  Diagnosis Date   Alcohol abuse    drinks on weekend   Anemia    Anxiety    Arthritis    Cancer (HCC)    colon,throat   COPD (chronic obstructive pulmonary disease) ([place])    Coronary artery disease    Depression    Gout    Hypertension    Myocardial infarction ([place])    Neuromuscular disorder ([place])    Seizures ([place])    last 6 months ago   Stroke [facility])    multiple  left side weakness   Tremors of nervous system     Medications:  No anticoagulation prior to admission per my chart review  Assessment: Patient is a 71 y/o M with medical history as above who is admitted with SBO in setting of chronic Ogilvie's syndrome. Pharmacy consulted to initiate heparin infusion for atrial fibrillation.   Date Time  HL Rate/Comment 9/28 1741 0.57 Therapeutic x1; 1400 un/hr  9/29     0205    0.91    SUPRAtherapeutic   9/30     0609    0.71    SUPRAtherapeutic 9/30 1451 0.63 Therapeutic x1 9/30     2319    0.64    Therapeutic X 2  10/01   0444    0.59    Therapeutic X 3   Baseline aPTT and PT-INR are  pending. Baseline CBC within normal limits.   Goal of Therapy:  Heparin level 0.3-0.7 units/ml Monitor platelets by anticoagulation protocol: Yes   Plan:  10/01:  HL @ 0444 = 0.59, therapeutic X 3 Will continue pt on current rate and recheck HL on 10/02 with AM labs.    Shateria Paternostro D Clinical Pharmacist 06/04/2022 5:29 AM

## 2022-06-04 NOTE — Progress Notes (Signed)
Progress Note   Patient: Calvin [name] ZOX:[id number] DOB: June 05, 1951 DOA: 05/28/2022     7 DOS: the patient was seen and examined on 06/04/2022   Brief hospital course: Taken from H&P.  Calvin [name] is a 71 y.o. male with medical history significant of Ogilvie's syndrome, ileus, hypertension, hyperlipidemia, COPD, stroke with mild left-sided weakness, GERD, gout, depression with anxiety, seizure, CAD, MI,  alcohol abuse, CKD stage IIIa, dCHF, colon cancer, thyroid cancer, who presents from his facility with Nausea, vomiting, abdominal distention, abdominal pain for 2 days. Found to have new small bowel obstruction on CT abdomen with transition point in the right lower abdomen suspicious for adhesion. General surgery was consulted and NG tube was placed.  Patient also developed left-sided tingling and numbness and facial droop while in ED, has an history of left-sided stroke with residual weakness.  Symptoms resolved within few minutes. CT head and MRI brain was negative for any acute intracranial abnormality and do reflect a chronic changes. 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 Neurology was consulted-patient to complete stroke work-up.  9/25: Labs with mildly elevated creatinine at 1.26 with baseline seems to be around 1.1-1.2.  Lipid panel normal with LDL of 57 CBC with mild leukocytosis at 11.1.  Hemoglobin stable,A1c 5.8 Repeat abdominal x-ray with stable obstructive findings and no other acute abnormality.  Continued to have significant colonic distention.  Passing flatus, minimum secretions with NG tube. general surgery would like to continue with conservative management with NG tube. Speech and cognition appears to be at baseline per speech evaluation. Patient uses quite a bit of opioids for chronic pain which can be contributory.  Try avoiding opioids if possible. We can try a Toradol as needed for pain with a close monitoring of renal function.  9/26: Patient  overnight complained about chest pain, on evaluation by night cross coverage, he was complaining more of generalized aches and pain but did responded to nitroglycerin x1.  EKG with sinus tachycardia, troponin 20>> 23. Patient was given a dose of morphine.  Renal function with slight worsening of creatinine to 1.41 on 05/30/2022.  We will avoid Toradol and pain will be managed with morphine although can contribute to his slower bowel function.  Repeat abdominal x-ray with air-filled distention of large and small bowel loops favoring ileus. CT scan on 06/01/2022 did not show any signs of obstruction, small bowel last dilated.  Still had dilation of the colon.  The patient was transferred to the stepdown unit for neostigmine dosing on 05/31/2022 and 06/01/2022.    06/02/2022 had colonoscopy to decompress air.  9/30 and 06/04/2022.  Unfortunately we do not have speech therapy over the weekend.  Continue NG tube feeding.  Swallow evaluation on Monday.  Transfer out of stepdown unit on 06/04/2022.   Assessment and Plan: Ogilvie's syndrome General surgery does not think this is bowel obstruction.  Gastroenterology gave neostigmine infusion on 05/31/2022 and on 06/01/22.  Decompressive colonoscopy procedure on 06/02/2022.  Currently on tube feedings.  MiraLAX via ngt twice daily.  Speech therapy eval to see if he can swallow.  Paroxysmal atrial flutter (HCC) Continue metoprolol.  Continue heparin drip for now.  Hopefully can switch over to Eliquis if passes swallow evaluation.  Was in normal sinus rhythm this morning.  Aspiration pneumonia (HCC) Continue 5 days of Zosyn  Cervical spinal stenosis MRI showing multilevel cervical spondylosis most pronounced at C3-C4 where moderate to severe canal stenosis and severe bilateral foraminal stenosis.  Patient is not  a great surgical candidate at this point.  Respiratory distress Respiratory distress on 06/01/2022 with upper airway congestion.  Improved with  Decadron and racemic epinephrine infusion.  Currently not on oxygen.  Chronic diastolic CHF (congestive heart failure) (HCC) -Last EF 50% -Hold Lasix -Watch closely with IV fluids  Seizures (HCC) Seizure -Seizure precaution -When necessary Ativan for seizure -IV Keppra and Depakote.    HTN (hypertension) Continue metoprolol via NG tube.  CAD (coronary artery disease) No chest pain.  Troponin negative x2 -Hold aspirin and Zocor.  Continue metoprolol  HLD (hyperlipidemia) - Hold Zocor  Chronic kidney disease, stage 3a (HCC) Last creatinine 1.20 with a GFR greater than 60  COPD (chronic obstructive pulmonary disease) (HCC) Continue nebulizers  Coffee ground emesis No recurrence of vomiting.  Watch closely with blood thinner.  Hemoglobin 12.3 today.  Tobacco abuse -nicotine patch  Depression with anxiety Hold home oral Xanax and Zoloft -As needed IV Ativan for anxiety  Neuropathy Looks more comfortable after restarting gabapentin        Subjective: Patient states he is not feeling good but asked to get out of the hospital.  Unable to elaborate on how he was feeling.  Admitted with distended abdomen and Ogilvie syndrome.  Physical Exam: Vitals:   06/04/22 0900 06/04/22 1000 06/04/22 1100 06/04/22 1200  BP: 135/79 123/70 133/83 (!) 144/79  Pulse:      Resp: '20 14 16 15  '$ Temp:  98.1 F (36.7 C)    TempSrc:      SpO2:      Weight:      Height:       Physical Exam HENT:     Head: Normocephalic.     Mouth/Throat:     Pharynx: No oropharyngeal exudate.  Eyes:     General: Lids are normal.     Conjunctiva/sclera: Conjunctivae normal.  Cardiovascular:     Rate and Rhythm: Normal rate and regular rhythm.     Heart sounds: Normal heart sounds, S1 normal and S2 normal.  Pulmonary:     Breath sounds: No decreased breath sounds, wheezing, rhonchi or rales.  Abdominal:     Palpations: Abdomen is soft.     Tenderness: There is no abdominal tenderness.      Comments: Less distention than 2 days ago.  Musculoskeletal:     Right lower leg: Swelling present.     Left lower leg: Swelling present.  Skin:    General: Skin is warm.     Findings: No rash.  Neurological:     Mental Status: He is alert.     Data Reviewed: Creatinine 1.2, sodium 147, hemoglobin 12.3  Family Communication: Updated patient's daughter on the phone  Disposition: Status is: Inpatient Inpatient appropriate because still has NG tube.  Speech therapy to evaluate to determine on if he can swallow.  Planned Discharge Destination: Rehab    Time spent: 28 minutes  Author: Loletha Grayer, MD 06/04/2022 12:10 PM  For on call review [email or web address].

## 2022-06-04 NOTE — Progress Notes (Signed)
Notified with more abdominal distention.  Suppository ordered, holding tube feeds.  Abdominal x-ray ordered.

## 2022-06-04 NOTE — Anesthesia Postprocedure Evaluation (Signed)
Anesthesia Post Note  Patient: Calvin [name] Below  Procedure(s) Performed: COLONOSCOPY WITH PROPOFOL  Patient location during evaluation: SICU Anesthesia Type: General Level of consciousness: sedated Pain management: pain level controlled Vital Signs Assessment: post-procedure vital signs reviewed and stable Respiratory status: patient remains intubated per anesthesia plan Cardiovascular status: stable Postop Assessment: no apparent nausea or vomiting Anesthetic complications: no   No notable events documented.   Last Vitals:  Vitals:   06/04/22 1000 06/04/22 1100  BP: 123/70 133/83  Pulse:    Resp: 14 16  Temp: 36.7 C   SpO2:      Last Pain:  Vitals:   06/04/22 1000  TempSrc:   PainSc: 0-No pain                 Martha Clan

## 2022-06-05 ENCOUNTER — Inpatient Hospital Stay: Payer: Medicare Other

## 2022-06-05 ENCOUNTER — Encounter: Payer: Self-pay | Admitting: Gastroenterology

## 2022-06-05 DIAGNOSIS — Z7189 Other specified counseling: Secondary | ICD-10-CM | POA: Diagnosis not present

## 2022-06-05 DIAGNOSIS — I4892 Unspecified atrial flutter: Secondary | ICD-10-CM | POA: Diagnosis not present

## 2022-06-05 DIAGNOSIS — M4802 Spinal stenosis, cervical region: Secondary | ICD-10-CM | POA: Diagnosis not present

## 2022-06-05 DIAGNOSIS — K5981 Ogilvie syndrome: Secondary | ICD-10-CM | POA: Diagnosis not present

## 2022-06-05 DIAGNOSIS — J690 Pneumonitis due to inhalation of food and vomit: Secondary | ICD-10-CM | POA: Diagnosis not present

## 2022-06-05 DIAGNOSIS — K56609 Unspecified intestinal obstruction, unspecified as to partial versus complete obstruction: Secondary | ICD-10-CM | POA: Diagnosis not present

## 2022-06-05 DIAGNOSIS — Z515 Encounter for palliative care: Secondary | ICD-10-CM | POA: Diagnosis not present

## 2022-06-05 LAB — BASIC METABOLIC PANEL
Anion gap: 7 (ref 5–15)
BUN: 14 mg/dL (ref 8–23)
CO2: 26 mmol/L (ref 22–32)
Calcium: 8.4 mg/dL — ABNORMAL LOW (ref 8.9–10.3)
Chloride: 111 mmol/L (ref 98–111)
Creatinine, Ser: 0.97 mg/dL (ref 0.61–1.24)
GFR, Estimated: >60 mL/min (ref >60)
Glucose, Bld: 94 mg/dL (ref 70–99)
Potassium: 3.7 mmol/L (ref 3.5–5.1)
Sodium: 144 mmol/L (ref 135–145)

## 2022-06-05 LAB — HEMOGLOBIN: Hemoglobin: 11.7 g/dL — ABNORMAL LOW (ref 13.0–17.0)

## 2022-06-05 LAB — HEPARIN LEVEL (UNFRACTIONATED): Heparin Unfractionated: 0.45 IU/mL (ref 0.30–0.70)

## 2022-06-05 MED ORDER — OSMOLITE 1.5 CAL PO LIQD
1000.0000 mL | ORAL | Status: DC
  Administered 2022-06-05: 1000 mL

## 2022-06-05 NOTE — Progress Notes (Signed)
[facility] SURGICAL PROGRESS NOTE (cpt [phone])  Hospital Day(s): 8.   Interval History: Patient seen and examined, no acute events or new complaints overnight. Patient somnolent but arouses. Does not participate much. He does endorse pain but is unable to localize this for me. He is undergoing breathing treatment at time of interview. Labs this morning are reassuring. No new imaging. He is s/p decompressive colonoscopy on 09/29. He does have a BM recorded.   Review of Systems:  Unable to reliably preform this morning secondary confusion, poor historian, no family at bedside to assist  Vital signs in last 24 hours: [min-max] current  Temp:  [97.6 F (36.4 C)-98.8 F (37.1 C)] 97.6 F (36.4 C) (10/02 0740) Pulse Rate:  [70-83] 74 (10/02 0740) Resp:  [14-21] 18 (10/02 0740) BP: (119-152)/(63-83) 125/72 (10/02 0740) SpO2:  [94 %-100 %] 96 % (10/02 0743)     Height: '5\' 11"'$  (180.3 cm) Weight: 96 kg BMI (Calculated): 29.53   Intake/Output last 2 shifts:  10/01 0701 - 10/02 0700 In: 1334.5 [I.V.:313.8; NG/GT:527.5; IV Piggyback:493.2] Out: 500 [Urine:500]   Physical Exam:  Constitutional: Alert, NAD HENT: normocephalic without obvious abnormality; NGT in place (clamped) Eyes: PERRL, EOM's grossly intact and symmetric  Respiratory: breathing non-labored at rest; receiving breathing treatment Cardiovascular; Regular rate; rhythm  Gastrointestinal: Soft, remains distended but improved mildly, no apparent tenderness, no rebound/guarding. He is certainly not peritonitic    Labs:     Latest Ref Rng & Units 06/05/2022    6:13 AM 06/04/2022    4:44 AM 06/03/2022    6:09 AM  CBC  WBC 4.0 - 10.5 K/uL  6.5  9.7   Hemoglobin 13.0 - 17.0 g/dL 11.7  12.3  12.4   Hematocrit 39.0 - 52.0 %  40.2  39.5   Platelets 150 - 400 K/uL  257  234       Latest Ref Rng & Units 06/05/2022    6:13 AM 06/04/2022    4:44 AM 06/02/2022    2:05 AM  CMP  Glucose 70 - 99 mg/dL 94  116  158   BUN  8 - 23 mg/dL '14  19  15   '$ Creatinine 0.61 - 1.24 mg/dL 0.97  1.20  1.27   Sodium 135 - 145 mmol/L 144  147  142   Potassium 3.5 - 5.1 mmol/L 3.7  3.8  3.6   Chloride 98 - 111 mmol/L 111  113  110   CO2 22 - 32 mmol/L '26  23  18   '$ Calcium 8.9 - 10.3 mg/dL 8.4  8.5  8.7     Imaging studies: No new pertinent imaging studies   Assessment/Plan: (ICD-10's: K52.609) 71 y.o. male with radiographically resolved small bowel obstruction also with stable appearance of chronic colonic distension, complicated by significant comorbid disease, physical deconditioning, overall poor functional status.   - Pending SLP evaluation. If he is cleared for PO intake, would recommend trial on CLD again with goal of removing NGT. If fails, would continue enteric feedings as tolerated.   - No emergent surgical intervention. He is a sub optimal candidate for any surgical intervention given his deconditioning and significant comorbid disease   - Monitor abdominal examination; on-going bowel function - Consider serial KUBs prn            - Pain control prn; limit narcotics as much as feasible - Palliative care on board; full scope; pending further discussion with family   - Further management per primary  service; we will remain available   All of the above findings and recommendations were discussed with the medical team  -- Edison Simon, PA-C [facility] 06/05/2022, 7:48 AM M-F: 7am - 4pm

## 2022-06-05 NOTE — TOC Progression Note (Signed)
Transition of Care [facility] - Progression Note    Patient Details  Name: Calvin [name] MRN: [id number] Date of Birth: [birth year]  Transition of [facility] CM/SW Contact  Beverly Sessions, RN Phone Number: 06/05/2022, 10:13 AM  Clinical Narrative:    Spoke with daughter [name].  She states the only other facility she would be interested in would be [facility] in [place].  Other wise she wants the patient to return to [facility].   Reached out to [name] at [facility] requesting review of referral    Expected Discharge Plan: [place] Barriers to Discharge: Continued Medical Work up  Expected Discharge Plan and Services Expected Discharge Plan: [place]   Discharge Planning Services: CM Consult Post Acute Care Choice: Resumption of Svcs/PTA Provider, [place] arrangements for the past 2 months: [place]                 DME Arranged: N/A DME Agency: NA       HH Arranged: NA HH Agency: NA         Social Determinants of Health (SDOH) Interventions    Readmission Risk Interventions    06/02/2022   12:02 PM 10/17/2021   10:47 AM  Readmission Risk Prevention Plan  Transportation Screening Complete Complete  PCP or Specialist Appt within 3-5 Days  Complete  Social Work Consult for [place] Planning/Counseling  Complete  Palliative Care Screening  Not Applicable  Medication Review [employer] photographer) Complete Complete  PCP or Specialist appointment within 3-5 days of discharge Complete   HRI or [place] Complete   SW Recovery Care/Counseling Consult Complete   Palliative Care Screening Complete   Skilled Nursing Facility Complete

## 2022-06-05 NOTE — Progress Notes (Signed)
Occupational Therapy Treatment Patient Details Name: Calvin [name] MRN: [id number] DOB: 1950-11-18 Today's Date: 06/05/2022   History of present illness Pt is 62 YOM admitted for small bowel obstruction and abdominal distention. PMH includes: CVA w L sided weakness, dCHF, seizures, CAD, HLD, CKD3a, COPD, tobacco abuse, depression, anxiety, and ogilive's syndrome.   OT comments  Pt seen for brief OT tx, limited by pt's fatigue. (After session note pt recently worked with PT). Pt agreeable to bed level grooming task which he completes with setup using RUE. Pt required total A +2 for boosting up in bed and  declined OT assist to reposition LUE for improved comfort. Pt continues to benefit from skilled OT services. Continue to recommend SNF at this time.    Recommendations for follow up therapy are one component of a multi-disciplinary discharge planning process, led by the attending physician.  Recommendations may be updated based on patient status, additional functional criteria and insurance authorization.    Follow Up Recommendations  Skilled nursing-short term rehab (<3 hours/day)    Assistance Recommended at Discharge Intermittent Supervision/Assistance  Patient can return home with the following  A little help with walking and/or transfers;[name];[name];Assist for transportation;Direct supervision/assist for medications management;Assistance with cooking/housework   Equipment Recommendations  Other (comment) (defer to next venue)    Recommendations for Other Services      Precautions / Restrictions Precautions Precautions: Fall;Other (comment) Precaution Comments: Seizure precautions Restrictions Weight Bearing Restrictions: No       Mobility Bed Mobility               General bed mobility comments: total A+2 to boost up in bed    Transfers                         Balance                                            ADL either performed or assessed with clinical judgement   ADL Overall ADL's : Needs assistance/impaired     Grooming: Wash/dry face Grooming Details (indicate cue type and reason): long sitting in bed, pt provided with set up to complete grooming task, declined additional tasks                                    Extremity/Trunk Assessment              Vision       Perception     Praxis      Cognition Arousal/Alertness: Awake/alert   Overall Cognitive Status: History of cognitive impairments - at baseline                                          Exercises      Shoulder Instructions       General Comments      Pertinent Vitals/ Pain       Pain Assessment Pain Assessment: No/denies pain  Home Living  Prior Functioning/Environment              Frequency  Min 2X/week        Progress Toward Goals  OT Goals(current goals can now be found in the care plan section)  Progress towards OT goals: Progressing toward goals  Acute Rehab OT Goals Patient Stated Goal: to eat OT Goal Formulation: With patient/family Time For Goal Achievement: 06/17/22 Potential to Achieve Goals: [place] Discharge plan remains appropriate;Frequency remains appropriate    Co-evaluation                 AM-PAC OT "6 Clicks" Daily Activity     Outcome Measure   Help from another person eating meals?: None Help from another person taking care of personal grooming?: A Little Help from another person toileting, which includes using toliet, bedpan, or urinal?: A Lot Help from another person bathing (including washing, rinsing, drying)?: A Lot Help from another person to put on and taking off regular upper body clothing?: A Lot Help from another person to put on and taking off regular lower body clothing?: A Lot 6 Click Score: 15     End of Session    OT Visit Diagnosis: Muscle weakness (generalized) (M62.81);Unsteadiness on feet (R26.81)   Activity Tolerance Patient limited by fatigue   Patient Left in bed;with call bell/phone within reach;with bed alarm set   Nurse Communication          Time: 2947-6546 OT Time Calculation (min): 10 min  Charges: OT General Charges $OT Visit: 1 Visit OT Treatments $Self Care/Home Management : 8-22 mins  Ardeth Perfect., MPH, MS, OTR/L ascom [phone] 06/05/22, 3:36 PM

## 2022-06-05 NOTE — Progress Notes (Signed)
Progress Note   Patient: Calvin [name] DOB: [birth year] DOA: 05/28/2022     8 DOS: the patient was seen and examined on 06/05/2022   Brief hospital course: Taken from H&P.  Calvin [name] is a 71 y.o. male with medical history significant of Ogilvie's syndrome, ileus, hypertension, hyperlipidemia, COPD, stroke with mild left-sided weakness, GERD, gout, depression with anxiety, seizure, CAD, MI,  alcohol abuse, CKD stage IIIa, dCHF, colon cancer, thyroid cancer, who presents from his facility with Nausea, vomiting, abdominal distention, abdominal pain for 2 days. Found to have new small bowel obstruction on CT abdomen with transition point in the right lower abdomen suspicious for adhesion. General surgery was consulted and NG tube was placed.  Patient also developed left-sided tingling and numbness and facial droop while in ED, has an history of left-sided stroke with residual weakness.  Symptoms resolved within few minutes. CT head and MRI brain was negative for any acute intracranial abnormality and do reflect a chronic changes. 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 Neurology was consulted-patient to complete stroke work-up.  9/25: Labs with mildly elevated creatinine at 1.26 with baseline seems to be around 1.1-1.2.  Lipid panel normal with LDL of 57 CBC with mild leukocytosis at 11.1.  Hemoglobin stable,A1c 5.8 Repeat abdominal x-ray with stable obstructive findings and no other acute abnormality.  Continued to have significant colonic distention.  Passing flatus, minimum secretions with NG tube. general surgery would like to continue with conservative management with NG tube. Speech and cognition appears to be at baseline per speech evaluation. Patient uses quite a bit of opioids for chronic pain which can be contributory.  Try avoiding opioids if possible. We can try a Toradol as needed for pain with a close monitoring of renal function.  9/26: Patient  overnight complained about chest pain, on evaluation by night cross coverage, he was complaining more of generalized aches and pain but did responded to nitroglycerin x1.  EKG with sinus tachycardia, troponin 20>> 23. Patient was given a dose of morphine.  Renal function with slight worsening of creatinine to 1.41 on 05/30/2022.  We will avoid Toradol and pain will be managed with morphine although can contribute to his slower bowel function.  Repeat abdominal x-ray with air-filled distention of large and small bowel loops favoring ileus. CT scan on 06/01/2022 did not show any signs of obstruction, small bowel last dilated.  Still had dilation of the colon.  The patient was transferred to the stepdown unit for neostigmine dosing on 05/31/2022 and 06/01/2022.    06/02/2022 had colonoscopy to decompress air.  9/30 and 06/04/2022.  Unfortunately we do not have speech therapy over the weekend.  Continue NG tube feeding.  Swallow evaluation on Monday.  Transfer out of stepdown unit on 06/04/2022.  03/05/2022.  Case discussed with gastroenterology and they will do a another decompressive colonoscopy on 06/06/2022.  Case discussed with speech therapy and modified barium swallow will happen on 06/07/2022.  NG tube will have to be out at that time.   Assessment and Plan: Ogilvie's syndrome General surgery does not think this is bowel obstruction.  Gastroenterology gave neostigmine infusion on 05/31/2022 and on 06/01/22.  Decompressive colonoscopy procedure on 06/02/2022.  Currently on tube feedings.  MiraLAX via ngt twice daily.  GI to do another decompressive colonoscopy on 06/06/2022.  Speech therapy team to Modified barium swallow on 06/07/2022.  Paroxysmal atrial flutter (HCC) Continue metoprolol.  Continue heparin drip for now.  Hemoglobin drifting a little  bit continue to watch for any signs of bleeding. Hopefully can switch over to Eliquis if passes swallow evaluation.  Was in normal sinus rhythm this  morning.  Aspiration pneumonia (HCC) Continue 5 days of Zosyn (today is day 5)  Cervical spinal stenosis MRI showing multilevel cervical spondylosis most pronounced at C3-C4 where moderate to severe canal stenosis and severe bilateral foraminal stenosis.  Patient is not a great surgical candidate at this point.  Respiratory distress Respiratory distress on 06/01/2022 with upper airway congestion.  Improved with Decadron and racemic epinephrine infusion.  Currently not on oxygen.  Chronic diastolic CHF (congestive heart failure) (HCC) -Last EF 50% -Hold Lasix   Seizures (HCC) Seizure -Seizure precaution -When necessary Ativan for seizure -IV Keppra and Depakote.    HTN (hypertension) Continue metoprolol via NG tube.  CAD (coronary artery disease) No chest pain.  Troponin negative x2 -Hold aspirin and Zocor.  Continue metoprolol  HLD (hyperlipidemia) Hold Zocor  Chronic kidney disease, stage 3a (HCC) Last creatinine 0.97 with a GFR greater than 60  COPD (chronic obstructive pulmonary disease) (HCC) Continue nebulizers  Coffee ground emesis No recurrence of vomiting.  Watch closely with blood thinner.  Hemoglobin 11.7 today.  Tobacco abuse -nicotine patch  Depression with anxiety Hold home oral Xanax and Zoloft -As needed IV Ativan for anxiety  Neuropathy Continue gabapentin        Subjective: Patient not feeling good.  Does not elaborate much.  Having some abdominal discomfort.  Passing gas but no bowel movement.  Admitted with Ogilvie's syndrome and distended abdomen.  Physical Exam: Vitals:   06/04/22 2039 06/05/22 0421 06/05/22 0740 06/05/22 0743  BP:  122/75 125/72   Pulse:  78 74   Resp:  18 18   Temp:  97.9 F (36.6 C) 97.6 F (36.4 C)   TempSrc:  Oral Oral   SpO2: 94% 95% 100% 96%  Weight:      Height:       Physical Exam HENT:     Head: Normocephalic.     Mouth/Throat:     Pharynx: No oropharyngeal exudate.  Eyes:     General: Lids  are normal.     Conjunctiva/sclera: Conjunctivae normal.  Cardiovascular:     Rate and Rhythm: Normal rate and regular rhythm.     Heart sounds: Normal heart sounds, S1 normal and S2 normal.  Pulmonary:     Breath sounds: Transmitted upper airway sounds present. No decreased breath sounds, wheezing, rhonchi or rales.  Abdominal:     General: There is distension.     Palpations: Abdomen is soft.     Tenderness: There is no abdominal tenderness.  Musculoskeletal:     Right lower leg: Swelling present.     Left lower leg: Swelling present.  Skin:    General: Skin is warm.     Findings: No rash.  Neurological:     Mental Status: He is alert.     Data Reviewed: Creatinine 0.97, sodium 144, hemoglobin 11.7  Family Communication: Updated patient's daughter on the phone  Disposition: Status is: Inpatient Remains inpatient appropriate because: Still having distended abdomen.  GI to do a another decompressive colonoscopy tomorrow.  Planned Discharge Destination: Rehab    Time spent: 29 minutes Case discussed with gastroenterology and speech therapy.  Author: Loletha Grayer, MD 06/05/2022 3:13 PM  For on call review [email or web address]

## 2022-06-05 NOTE — Anesthesia Preprocedure Evaluation (Signed)
Anesthesia Evaluation  Patient identified by MRN, date of birth, ID band Patient awake    Reviewed: Allergy & Precautions, H&P , NPO status , Patient's Chart, lab work & pertinent test results, reviewed documented beta blocker date and time   Airway Mallampati: II   Neck ROM: full    Dental  (+) Poor Dentition   Pulmonary pneumonia, COPD, Current Smoker,    Pulmonary exam normal        Cardiovascular Exercise Tolerance: Poor hypertension, + CAD and + Past MI  Normal cardiovascular exam Rhythm:regular Rate:Normal     Neuro/Psych Seizures -,  Anxiety Depression  Neuromuscular disease CVA negative psych ROS   GI/Hepatic negative GI ROS, Neg liver ROS,   Endo/Other  negative endocrine ROS  Renal/GU Renal disease  negative genitourinary   Musculoskeletal   Abdominal   Peds  Hematology  (+) Blood dyscrasia, anemia ,   Anesthesia Other Findings Past Medical History: No date: Alcohol abuse     Comment:  drinks on weekend No date: Anemia No date: Anxiety No date: Arthritis No date: Cancer ([place])     Comment:  colon,throat No date: COPD (chronic obstructive pulmonary disease) (HCC) No date: Coronary artery disease No date: Depression No date: Gout No date: Hypertension No date: Myocardial infarction ([place]) No date: Neuromuscular disorder ([place]) No date: Seizures ([place])     Comment:  last 6 months ago No date: Stroke [facility])     Comment:  multiple  left side weakness No date: Tremors of nervous system Past Surgical History: 10/19/2015: CARPAL TUNNEL RELEASE; Left     Comment:  Procedure: CARPAL TUNNEL RELEASE;  Surgeon: Hessie Knows, MD;  Location: [facility] ORS;  Service: Orthopedics;                Laterality: Left; No date: COLON SURGERY 10/26/2021: COLONOSCOPY WITH PROPOFOL; N/A     Comment:  Procedure: COLONOSCOPY WITH PROPOFOL;  Surgeon:               Lesly Rubenstein, MD;  Location: [facility] ENDOSCOPY;                 Service: Endoscopy;  Laterality: N/A; No date: JOINT REPLACEMENT     Comment:  left partial hip  2013: THROAT SURGERY     Comment:  cancer BMI    Body Mass Index: 29.52 kg/m     Reproductive/Obstetrics negative OB ROS                             Anesthesia Physical Anesthesia Plan  ASA: 3  Anesthesia Plan: General   Post-op Pain Management:    Induction:   PONV Risk Score and Plan:   Airway Management Planned:   Additional Equipment:   Intra-op Plan:   Post-operative Plan:   Informed Consent: I have reviewed the patients History and Physical, chart, labs and discussed the procedure including the risks, benefits and alternatives for the proposed anesthesia with the patient or authorized representative who has indicated his/her understanding and acceptance.     Dental Advisory Given  Plan Discussed with: CRNA  Anesthesia Plan Comments:         Anesthesia Quick Evaluation

## 2022-06-05 NOTE — Progress Notes (Signed)
Patient overnight seeming to be more congested with rhonchi. Contacted Neomia Glass NP. Holding IVF. Suctioned patient without much success, he is not able to cough much up right now. He is drowsy possibly from ativan will wake up and converse but goes right back to sleeping. Chest XR ordered as well. Will continue to monitor.

## 2022-06-05 NOTE — Evaluation (Signed)
Clinical/Bedside Swallow Evaluation Patient Details  Name: Calvin [name] MRN: [id number] Date of Birth: 1950/12/11  Today's Date: 06/05/2022 Time: SLP Start Time (ACUTE ONLY): 31 SLP Stop Time (ACUTE ONLY): 1350 SLP Time Calculation (min) (ACUTE ONLY): 50 min  Past Medical History:  Past Medical History:  Diagnosis Date   Alcohol abuse    drinks on weekend   Anemia    Anxiety    Arthritis    Cancer ([place])    colon,throat   COPD (chronic obstructive pulmonary disease) ([place])    Coronary artery disease    Depression    Gout    Hypertension    Myocardial infarction ([place])    Neuromuscular disorder ([place])    Seizures ([place])    last 6 months ago   Stroke [facility])    multiple  left side weakness   Tremors of nervous system    Past Surgical History:  Past Surgical History:  Procedure Laterality Date   CARPAL TUNNEL RELEASE Left 10/19/2015   Procedure: CARPAL TUNNEL RELEASE;  Surgeon: Hessie Knows, MD;  Location: [facility] ORS;  Service: Orthopedics;  Laterality: Left;   COLON SURGERY     COLONOSCOPY WITH PROPOFOL N/A 10/26/2021   Procedure: COLONOSCOPY WITH PROPOFOL;  Surgeon: Lesly Rubenstein, MD;  Location: [facility] ENDOSCOPY;  Service: Endoscopy;  Laterality: N/A;   COLONOSCOPY WITH PROPOFOL N/A 06/02/2022   Procedure: COLONOSCOPY WITH PROPOFOL;  Surgeon: Lesly Rubenstein, MD;  Location: [facility] ENDOSCOPY;  Service: Endoscopy;  Laterality: N/A;   JOINT REPLACEMENT     left partial hip    THROAT SURGERY  2013   cancer   HPI:  Pt is 26 YOM admitted for small bowel obstruction and abdominal distention. PMH includes: CVA w L sided weakness, dCHF, seizures, CAD, HLD, CKD3a, COPD, tobacco abuse, depression, anxiety, and ogilive's syndrome.  Surgery consulted: "abdominal pain, distension, nausea, and emesis thought to be secondary to possible small bowel obstruction also with stable appearance of chronic colonic distension, complicated by significant comorbid disease.".   CT of Abd: Lower chest:  Stable small solid pulmonary nodule of the right lower  lobe measuring 5 mm on series 4, image 1. New mild linear nodular  opacity of the left lower lobe.  CXR on 06/05/2022: Enlarged cardiopericardial silhouette with pulmonary vascular  congestion.   Surgery is following for decompressive colonoscopy now; another is planned for 06/06/2022 per MD.    Assessment / Plan / Recommendation  Clinical Impression   Pt seen for BSE today. MD asked for a reassessment of pt's swallow function d/t pt's improvement in recent days. Surgery is still following for abdominal/GI issues and is planning for another decompressive colonoscopy tomorrow per MD.  Pt awake, resting in bed w/ less discomfort reported than at previous BSE. Vocal quality at rest much improved; congested cough+. NGT present. Less laryngo-pharyngeal secretions noted.  On RA; afebrile.    At this assessment today, pt w/ eyes open and verbally communicating; much improved State. Pt was able to hold cup to help w/ self-feeding/drinking.     Pt appears to present w/ primary pharyngeal phase Dysphagia in setting of declined Medical status, acute illness including abdominal issues, and presence of NGT(large bore). Pulmonary status appears declined but improved from last BSE. Pt appears to have improved sensation to native secretions exhibiting a cough to [name] them upon SLP's request.   Pt's risk for aspiration is HIGH; recommend NPO status w/ consideration of MBSS next 1-2 days (per Surgery procedure) AND removal of NGT  to fully assess pharyngeal swallow function for recommendation of a safe oral diet. Dietician is following closely.   Pt consumed trials of a single ice chips, thin and Nectar liquids, and puree w/ adequate oral phase management of boluses; Timely A-P transfer for swallowing. Appropriate oral clearing w/ all consistencies noted.  However, consistent pharyngeal phase deficits noted w/ all consistencies -- unsure if related to presence of  large bore NGT. Pt exhibited immediate and delayed throat clearing and coughing w/ all consistencies.    OM Exam was more complete this session d/t improved follow through; no unilateral weakness noted, min generalized oral weakness.    In setting of current Acute illness w/ declined Medical status and current swallowing presentation w/ NGT presence, and risk for aspiration, recommend NPO status. Recommend Ice chip for pleasure post thorough oral care. MBSS to be scheduled next 1-2 days to better assess swallow function. MD/NSG/Dietician updated.  SLP Visit Diagnosis: Dysphagia, oropharyngeal phase (R13.12) (suspect impact from NGT)    Aspiration Risk  Severe aspiration risk;Risk for inadequate nutrition/hydration    Diet Recommendation   NPO w/ pleasure ice chips post oral care; general aspiration precautions  Medication Administration: Via alternative means    Other  Recommendations Recommended Consults:  (Surgey following; Dietician and Palliative Care following) Oral Care Recommendations: Oral care QID;Oral care prior to ice chip/H20;Staff/trained caregiver to provide oral care Other Recommendations:  (TBD)    Recommendations for follow up therapy are one component of a multi-disciplinary discharge planning process, led by the attending physician.  Recommendations may be updated based on patient status, additional functional criteria and insurance authorization.  Follow up Recommendations Skilled nursing-short term rehab (<3 hours/day)      Assistance Recommended at Discharge Frequent or constant Supervision/Assistance  Functional Status Assessment Patient has had a recent decline in their functional status and/or demonstrates limited ability to make significant improvements in function in a reasonable and predictable amount of time  Frequency and Duration min 2x/week  2 weeks       Prognosis Prognosis for Safe Diet Advancement: Guarded (-Fair) Barriers to Reach Goals: Cognitive  deficits;Language deficits;Time post onset;Severity of deficits Barriers/Prognosis Comment: deconditioned; extended illness      Swallow Study   General Date of Onset: 05/28/22 HPI: Pt is 48 YOM admitted for small bowel obstruction and abdominal distention. PMH includes: CVA w L sided weakness, dCHF, seizures, CAD, HLD, CKD3a, COPD, tobacco abuse, depression, anxiety, and ogilive's syndrome.  Surgery consulted: "abdominal pain, distension, nausea, and emesis thought to be secondary to possible small bowel obstruction also with stable appearance of chronic colonic distension, complicated by significant comorbid disease.".   CT of Abd: Lower chest: Stable small solid pulmonary nodule of the right lower  lobe measuring 5 mm on series 4, image 1. New mild linear nodular  opacity of the left lower lobe.  CXR on 06/05/2022: Enlarged cardiopericardial silhouette with pulmonary vascular  congestion.   Surgery is following for decompressive colonoscopy now; another is planned for 06/06/2022 per MD. Type of Study: Bedside Swallow Evaluation (repeat) Previous Swallow Assessment: 06/01/2022 Diet Prior to this Study: NPO;NG Tube (w/ NGT TFs at trickle d/t abdominal issues) Temperature Spikes Noted: No (wbc 6.5) Respiratory Status: Room air History of Recent Intubation: No Behavior/Cognition: Alert;Cooperative;Pleasant mood;Distractible;Requires cueing (baseline Dysarthria) Oral Cavity Assessment: Dry Oral Care Completed by SLP: Yes Oral Cavity - Dentition: Edentulous Vision: Functional for self-feeding Self-Feeding Abilities: Able to feed self;Needs assist;Needs set up;Total assist (d/t NGT) Patient Positioning: Upright in bed (needed  full positioning upright in bed d/t weakness) Baseline Vocal Quality: Normal (slight wet quality intermittently; NGT present) Volitional Cough: Strong;Congested Volitional Swallow: Able to elicit    Oral/Motor/Sensory Function Overall Oral Motor/Sensory Function: Generalized  oral weakness (no unilateral weakness)   Ice Chips Ice chips: Within functional limits Presentation: Spoon (fed; 5 trials)   Thin Liquid Thin Liquid: Impaired Presentation: Straw (pinched for small sips; 3 trials) Oral Phase Impairments:  (n/a) Pharyngeal  Phase Impairments: Cough - Delayed;Throat Clearing - Delayed Other Comments: 3/3 trials    Nectar Thick Nectar Thick Liquid: Impaired Presentation: Spoon (1 trial) Oral Phase Impairments:  (n/a) Pharyngeal Phase Impairments: Cough - Immediate   Honey Thick Honey Thick Liquid: Not tested   Puree Puree: Impaired Presentation: Spoon (fed; 3 trials) Oral Phase Impairments:  (n/a) Pharyngeal Phase Impairments: Throat Clearing - Delayed;Throat Clearing - Immediate Other Comments: 3/3 trials   Solid     Solid: Not tested        Orinda Kenner, MS, CCC-SLP Speech Language Pathologist Rehab Services; [place] [phone] (ascom) Matty Vanroekel 06/05/2022,4:37 PM

## 2022-06-05 NOTE — Progress Notes (Signed)
       CROSS COVER NOTE  NAME: Calvin [name] MRN: [id number] DOB : [birth year]    Date of Service   06/05/2022   HPI/Events of Note   Notified of increased Rhonchi and gurgling sounds when breathing with weak cough. No increased work of breathing, tachypnea, or increased oxygen requirement.  Interventions   Assessment/Plan:  Hold IVF Suction airway PRN CXR- Pumlonary vascular congestion without pulmonary edema (Defer lasix decision to day physician)     This document was prepared using Dragon voice recognition software and may include unintentional dictation errors.  Neomia Glass DNP, MHA, FNP-BC Nurse Practitioner [facility] Pager [phone]

## 2022-06-05 NOTE — Progress Notes (Signed)
GI Inpatient Follow-up Note  Subjective:  Patient seen and is awake and alert. His belly has become distended again and has not had any bowel movements since Friday when the last decompressive colonoscopy was performed.  Scheduled Inpatient Medications:   budesonide (PULMICORT) nebulizer solution  0.5 mg Nebulization BID   ciprofloxacin  1 drop Both Eyes Q4H while awake   free water  30 mL Per Tube Q4H   gabapentin  400 mg Per Tube Q12H   ipratropium-albuterol  3 mL Nebulization BID   lidocaine HCl (PF)  10 mL Other Once   LORazepam  1 mg Intravenous QHS   metoprolol tartrate  12.5 mg Per Tube BID   multivitamin  15 mL Per Tube Daily   nicotine  21 mg Transdermal Daily   pantoprazole (PROTONIX) IV  40 mg Intravenous Q12H   polyethylene glycol  17 g Per Tube BID    Continuous Inpatient Infusions:    feeding supplement (OSMOLITE 1.5 CAL) 1,000 mL (06/05/22 1232)   heparin 1,100 Units/hr (06/05/22 0730)   levETIRAcetam 1,000 mg (06/05/22 0901)   piperacillin-tazobactam (ZOSYN)  IV 12.5 mL/hr at 06/05/22 0730   valproate sodium 250 mg (06/05/22 1234)    PRN Inpatient Medications:  acetaminophen **OR** acetaminophen (TYLENOL) oral liquid 160 mg/5 mL **OR** acetaminophen, albuterol, bisacodyl, cyclobenzaprine, hydrALAZINE, lidocaine, LORazepam, LORazepam, metoprolol tartrate, nitroGLYCERIN, ondansetron (ZOFRAN) IV, phenol, polyvinyl alcohol, senna-docusate, trolamine salicylate  Review of Systems:  Review of Systems  Constitutional:  Negative for chills and fever.  Respiratory:  Positive for sputum production.   Cardiovascular:  Negative for chest pain.  Gastrointestinal:  Positive for nausea. Negative for abdominal pain, constipation and diarrhea.  Musculoskeletal:  Positive for joint pain.  Neurological:  Negative for focal weakness.  Psychiatric/Behavioral:  Negative for substance abuse.   All other systems reviewed and are negative.     Physical Examination: BP 125/72 (BP  Location: Left Arm)   Pulse 74   Temp 97.6 F (36.4 C) (Oral)   Resp 18   Ht '5\' 11"'$  (1.803 m)   Wt 96 kg   SpO2 96%   BMI 29.52 kg/m  Gen: NAD HEENT: PEERLA, EOMI, Neck: supple, no JVD or thyromegaly Chest: No respiratory distress Abd: distended but still soft Ext: no edema Skin: no rash or lesions noted Lymph: no LAD  Data: Lab Results  Component Value Date   WBC 6.5 06/04/2022   HGB 11.7 (L) 06/05/2022   HCT 40.2 06/04/2022   MCV 97.3 06/04/2022   PLT 257 06/04/2022   Recent Labs  Lab 06/03/22 0609 06/04/22 0444 06/05/22 0613  HGB 12.4* 12.3* 11.7*   Lab Results  Component Value Date   NA 144 06/05/2022   K 3.7 06/05/2022   CL 111 06/05/2022   CO2 26 06/05/2022   BUN 14 06/05/2022   CREATININE 0.97 06/05/2022   Lab Results  Component Value Date   ALT 13 05/29/2022   AST 18 05/29/2022   ALKPHOS 61 05/29/2022   BILITOT 0.8 05/29/2022   Recent Labs  Lab 06/01/22 0740  APTT 35  INR 1.2   Assessment/Plan: Calvin [name] is a 71 y.o. gentleman with history of chronic ogilvie's syndrome, COPD, hypertension, hyperlipidemia, and multiple other medical comorbidities here with resolved SBO and now worsening pseudoobstruction we have been consulted for ogilvie's syndrome. Underwent decompressive colonoscopy on 9/29 but not surprisingly, colonic distension has recurred which can happen 40% of the time after decompressive colonoscopy  Recommendations:  - will add-on for potential repeat  decompressive colonoscopy with tube placement if patient continue to not pass gas or have any bowel movements - consider restarting tube feeds if tolerating them with NPO at midnight, if not tolerating then would strongly consider PPN/TPN to maintain adequate nutrition - I discontinue narcotics which should be avoided - continue miralax - please attempt to get up and move around, at least to the chair - replete electrolytes - monitor for bowel movements/passing gas  Calvin Miyamoto  MD, MPH [place]

## 2022-06-05 NOTE — Progress Notes (Signed)
Nutrition Follow Up Note   DOCUMENTATION CODES:   Not applicable  INTERVENTION:   Increase Osmolite 1.5 to 40ml/hr- once tolerating, increase by 45ml/hr q 8 hours until goal rate of 63ml/hr is reached.   ProSource TF 20- Give 60ml daily via tube, each supplement provides 80kcal and 20g of protein.   Free water flushes 104ml q4 hours to maintain tube patency   Regimen at goal rate provides 2060kcal/day, 103g/day protein and 1161ml/day of free water.   Pt at high refeed risk; recommend monitor potassium, magnesium and phosphorus labs daily until stable  MVI daily via tube   NUTRITION DIAGNOSIS:   Inadequate oral intake related to acute illness as evidenced by other (comment) (pt on NPO/clear liquid diet since admission).  GOAL:   Patient will meet greater than or equal to 90% of their needs -not met   MONITOR:   Labs, Weight trends, TF tolerance, Skin, I & O's  ASSESSMENT:   71 y.o. male with medical history significant of Ogilvie's syndrome, ileus, hypertension, hyperlipidemia, COPD, MI, stroke with mild left-sided weakness, GERD, gout, depression with anxiety, seizure, DDD, CAD, MI,  alcohol abuse, CKD stage IIIa, dCHF, colon cancer and thyroid cancer who is admitted with colonic ileus secondary to Ogilvie syndrome and possible SBO.  Pt s/p colonic decompression 9/29  Met with pt in room today. Pt unable to provide much history but shakes his head "no" when asked if he feels ok today. Pt with NGT in place and continues on trickle tube feeds. Pt with worsening distension and no BM in 4 days. KUB reports NGT in good position but worsened gaseous distention of the colon. Pt has been unable to advance past trickle tube feeds and has now been without adequate nutrition for > 7 days. Pt has remained NPO per SLP. Plan is for repeat colon decompression tomorrow; will hold tube feeds after midnight. SLP is planning for MBSS on Wednesday which will require removal of NGT. Discussed  nutritional status with MD; if patient is able to be placed on a oral diet or if pt has poor oral intake, NGT will need to be replaced. If patient is unable to advance to goal rate on tube feeds, TPN may need to be considered. Pt remains at high refeed risk. Of note, vitamin E and A labs returned low; will plan on supplementation once nutritional plan established.   No new weight since 9/29; will request daily weights.   Medications reviewed and include: ciprofloxacin, MVI, nicotine, protonix, miralax, zosyn, heparin   Labs reviewed: K 3.7 wnl P 3.9 wnl, Mg 2.6(H)- 10/1 Vitamin A- 19.4(L), vitamin E 7.3(L), B12 993(H)- 9/26 AIC 5.8(H)- 9/24  Diet Order:   Diet Order             Diet NPO time specified  Diet effective now                  EDUCATION NEEDS:   No education needs have been identified at this time  Skin:  Skin Assessment: Reviewed RN Assessment  Last BM:  9/29  Height:   Ht Readings from Last 1 Encounters:  06/02/22 $RemoveB'5\' 11"'[id number]$  (1.803 m)    Weight:   Wt Readings from Last 1 Encounters:  06/02/22 96 kg    Ideal Body Weight:  78 kg  BMI:  Body mass index is 29.52 kg/m.  Estimated Nutritional Needs:   Kcal:  1900-2200kcal/day  Protein:  95-110g/day  Fluid:  1.9-2.2L/day  Koleen Distance MS, RD, LDN Please  refer to [facility] for RD and/or RD on-call/weekend/after hours pager 

## 2022-06-05 NOTE — Consult Note (Signed)
[place] for IV Heparin Indication: atrial fibrillation  Patient Measurements: Height: '5\' 11"'$  (180.3 cm) Weight: 96 kg (211 lb 10.3 oz) IBW/kg (Calculated) : 75.3 Heparin Dosing Weight: 94.7 kg  Labs: Recent Labs    06/03/22 0609 06/03/22 1451 06/03/22 2319 06/04/22 0444 06/05/22 0613  HGB 12.4*  --   --  12.3* 11.7*  HCT 39.5  --   --  40.2  --   PLT 234  --   --  257  --   HEPARINUNFRC 0.71*   < > 0.64 0.59 0.45  CREATININE  --   --   --  1.20 0.97   < > = values in this interval not displayed.     Estimated Creatinine Clearance: 80.2 mL/min (by C-G formula based on SCr of 0.97 mg/dL).   Medical History: Past Medical History:  Diagnosis Date   Alcohol abuse    drinks on weekend   Anemia    Anxiety    Arthritis    Cancer (HCC)    colon,throat   COPD (chronic obstructive pulmonary disease) ([place])    Coronary artery disease    Depression    Gout    Hypertension    Myocardial infarction ([place])    Neuromuscular disorder ([place])    Seizures ([place])    last 6 months ago   Stroke [facility])    multiple  left side weakness   Tremors of nervous system     Medications:  No anticoagulation prior to admission per my chart review  Assessment: Patient is a 71 y/o M with medical history as above who is admitted with SBO in setting of chronic Ogilvie's syndrome. Pharmacy consulted to initiate heparin infusion for atrial fibrillation.   Date Time  HL Rate/Comment 9/28 1741 0.57 Therapeutic x1; 1400 un/hr  9/29     0205    0.91    SUPRAtherapeutic   9/30     0609    0.71    SUPRAtherapeutic 9/30 1451 0.63 Therapeutic x1 9/30     2319    0.64    Therapeutic X 2  10/01   0444    0.59    Therapeutic X 3  10/02   0613    0.45    Therapeutic X 4   Baseline aPTT and PT-INR are pending. Baseline CBC within normal limits.   Goal of Therapy:  Heparin level 0.3-0.7 units/ml Monitor platelets by anticoagulation protocol: Yes   Plan:  10/02:   HL @ 0613 = 0.45, therapeutic X 4 Will continue pt on current rate and recheck HL on 10/03 with AM labs.   Torin Whisner D Clinical Pharmacist 06/05/2022 7:19 AM

## 2022-06-05 NOTE — Progress Notes (Signed)
Physical Therapy Treatment Patient Details Name: Calvin [name] MRN: [id number] DOB: [birth year] Today's Date: 06/05/2022   History of Present Illness Pt is 20 YOM admitted for small bowel obstruction and abdominal distention. PMH includes: CVA w L sided weakness, dCHF, seizures, CAD, HLD, CKD3a, COPD, tobacco abuse, depression, anxiety, and ogilive's syndrome.    PT Comments    Pt was pleasant and motivated to participate during the session and put forth good effort throughout. Pt presented with a significant improvement in alertness and ability to process commands compared to this author's last interaction with the pt on 9/27.  Pt required physical assistance with all functional tasks including maintaining both sitting and standing balance but reported no adverse symptoms during the session other than abdominal pain.  Pt was unable to advance either LE while in standing secondary to weakness and posterior instability but was able to remain in standing grossly 60 sec.  Pt will benefit from PT services in a SNF setting upon discharge to safely address deficits listed in patient problem list for decreased caregiver assistance and eventual return to PLOF.     Recommendations for follow up therapy are one component of a multi-disciplinary discharge planning process, led by the attending physician.  Recommendations may be updated based on patient status, additional functional criteria and insurance authorization.  Follow Up Recommendations  Skilled nursing-short term rehab (<3 hours/day) Can patient physically be transported by private vehicle: No   Assistance Recommended at Discharge Frequent or constant Supervision/Assistance  Patient can return home with the following Assistance with cooking/housework;Direct supervision/assist for financial management;A lot of help with walking and/or transfers;Assist for transportation;Direct supervision/assist for medications management;A lot of help with  bathing/dressing/bathroom   Equipment Recommendations  None recommended by PT    Recommendations for Other Services       Precautions / Restrictions Precautions Precautions: Fall;Other (comment) (seizure) Precaution Comments: Seizure precautions Restrictions Weight Bearing Restrictions: No     Mobility  Bed Mobility Overal bed mobility: Needs Assistance Bed Mobility: Sit to Supine, Supine to Sit, Rolling Rolling: Mod assist   Supine to sit: Mod assist Sit to supine: +2 for physical assistance, Mod assist   General bed mobility comments: Mod to +2 mod for BLE and trunk control    Transfers Overall transfer level: Needs assistance Equipment used: Rolling walker (2 wheels) Transfers: Sit to/from Stand Sit to Stand: Mod assist, +2 physical assistance, From elevated surface           General transfer comment: Mod verbal cues for increased trunk flexion; constand min to mod A to prevent posterior LOB with max standing time around 60 sec; unable to advance either LE    Ambulation/Gait               General Gait Details: Unable   Stairs             Wheelchair Mobility    Modified Rankin (Stroke Patients Only)       Balance Overall balance assessment: Needs assistance Sitting-balance support: Bilateral upper extremity supported, Feet unsupported Sitting balance-Leahy Scale: Poor Sitting balance - Comments: Occasional min A to prevent R lateral LOB Postural control: Right lateral lean Standing balance support: Bilateral upper extremity supported, During functional activity, Reliant on assistive device for balance Standing balance-Leahy Scale: Poor Standing balance comment: Min to mod A to prevent posterior LOB  Cognition Arousal/Alertness: Awake/alert Behavior During Therapy: Flat affect Overall Cognitive Status: History of cognitive impairments - at baseline                                           Exercises Total Joint Exercises Ankle Circles/Pumps: AROM, Strengthening, Both, 5 reps, 10 reps Quad Sets: Strengthening, Both, 5 reps, 10 reps Heel Slides: AAROM, Strengthening, Both, 5 reps Hip ABduction/ADduction: AAROM, Strengthening, Both, 5 reps Straight Leg Raises: AAROM, Strengthening, Both, 5 reps Other Exercises Other Exercises: Static sitting at the EOB x 5 min for postural control and improved activity tolerance    General Comments        Pertinent Vitals/Pain Pain Assessment Pain Assessment: 0-10 Pain Score: 4  Pain Location: abdominal pain Pain Descriptors / Indicators: Sore, Aching Pain Intervention(s): Repositioned, Premedicated before session, Monitored during session    Home Living                          Prior Function            PT Goals (current goals can now be found in the care plan section) Progress towards PT goals: Progressing toward goals    Frequency    Min 2X/week      PT Plan Current plan remains appropriate    Co-evaluation              AM-PAC PT "6 Clicks" Mobility   Outcome Measure  Help needed turning from your back to your side while in a flat bed without using bedrails?: A Lot Help needed moving from lying on your back to sitting on the side of a flat bed without using bedrails?: A Lot Help needed moving to and from a bed to a chair (including a wheelchair)?: Total Help needed standing up from a chair using your arms (e.g., wheelchair or bedside chair)?: A Lot Help needed to walk in hospital room?: Total Help needed climbing 3-5 steps with a railing? : Total 6 Click Score: 9    End of Session Equipment Utilized During Treatment: Gait belt Activity Tolerance: Patient tolerated treatment well Patient left: in bed;with call bell/phone within reach;with bed alarm set Nurse Communication: Mobility status PT Visit Diagnosis: Muscle weakness (generalized) (M62.81);Difficulty in walking, not elsewhere  classified (R26.2)     Time: 2094-7096 PT Time Calculation (min) (ACUTE ONLY): 24 min  Charges:  $Therapeutic Exercise: 8-22 mins $Therapeutic Activity: 8-22 mins                    D. Scott Karinna Beadles PT, DPT 06/05/22, 3:19 PM

## 2022-06-05 NOTE — Progress Notes (Signed)
Palliative: [name] is lying quietly in bed.  He appears acutely/chronically ill and somewhat frail.  He is resting comfortably, but wakes easily when I call his name.  Initially, when asked his name, he states, "I do not know".  When asked about our location, he becomes agitated asking how often he has to answer this question.  He is able to tell me that we are in [facility].  I ask the the month, and he is able to tell me October.  I believe that he is able to make his needs known.  There is no family at bedside at this time.  We talked about his acute health concerns.  Overall, he states that he does not feel that he is better.  He tells me that his throat hurts.  I asked [name] why he has not participated in goals of care discussions.  He tells me, "I did not want to".  We talk about healthcare power of attorney.  We talk about who makes healthcare choices if he cannot.  I share that so far, his daughter [name], has been assisting with decision making.    We talk about CODE STATUS, "life support".  [name] tells me, "I don't want that".  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  I ask if he was born into the church or converted.  He tells me that he converted.  I attempt to speak with him more about CODE STATUS, what he does and does not want, but he declines to speak to me any further, turning his head away.  At this point, goals are set by daughter.  Remains full scope/full code, it would be helpful for [name] to show meaningful recovery and be able to state his wishes.  They are working on Environmental manager with attending, general surgery consult, bedside nursing staff, transition of care team, related to patient condition, needs, goals of care, disposition.  Plan:    At this point continue full scope/full code, time for outcomes.  Although he is alert and seems to be oriented, he is not fully participating in goals of care discussions.  54 minutes   Calvin Axe, NP Palliative medicine team Team phone [phone] Greater than 50% of this time was spent counseling and coordinating care related to the above assessment and plan.

## 2022-06-06 ENCOUNTER — Inpatient Hospital Stay: Payer: Medicare Other

## 2022-06-06 ENCOUNTER — Encounter: Payer: Self-pay | Admitting: Internal Medicine

## 2022-06-06 ENCOUNTER — Encounter
Admission: EM | Disposition: A | Discharge status: Skilled Nursing Facility | Payer: Self-pay | Source: Skilled Nursing Facility | Attending: Internal Medicine

## 2022-06-06 ENCOUNTER — Inpatient Hospital Stay: Payer: Medicare Other | Admitting: Anesthesiology

## 2022-06-06 ENCOUNTER — Inpatient Hospital Stay: Payer: Self-pay

## 2022-06-06 DIAGNOSIS — J690 Pneumonitis due to inhalation of food and vomit: Secondary | ICD-10-CM | POA: Diagnosis not present

## 2022-06-06 DIAGNOSIS — K5981 Ogilvie syndrome: Secondary | ICD-10-CM | POA: Diagnosis not present

## 2022-06-06 DIAGNOSIS — I4892 Unspecified atrial flutter: Secondary | ICD-10-CM | POA: Diagnosis not present

## 2022-06-06 DIAGNOSIS — Z515 Encounter for palliative care: Secondary | ICD-10-CM | POA: Diagnosis not present

## 2022-06-06 DIAGNOSIS — M4802 Spinal stenosis, cervical region: Secondary | ICD-10-CM | POA: Diagnosis not present

## 2022-06-06 DIAGNOSIS — Z7189 Other specified counseling: Secondary | ICD-10-CM | POA: Diagnosis not present

## 2022-06-06 HISTORY — PX: COLONOSCOPY WITH PROPOFOL: SHX5780

## 2022-06-06 LAB — BASIC METABOLIC PANEL
Anion gap: 6 (ref 5–15)
BUN: 14 mg/dL (ref 8–23)
CO2: 25 mmol/L (ref 22–32)
Calcium: 8.2 mg/dL — ABNORMAL LOW (ref 8.9–10.3)
Chloride: 111 mmol/L (ref 98–111)
Creatinine, Ser: 1.09 mg/dL (ref 0.61–1.24)
GFR, Estimated: >60 mL/min (ref >60)
Glucose, Bld: 96 mg/dL (ref 70–99)
Potassium: 3.5 mmol/L (ref 3.5–5.1)
Sodium: 142 mmol/L (ref 135–145)

## 2022-06-06 LAB — HEMOGLOBIN: Hemoglobin: 11.8 g/dL — ABNORMAL LOW (ref 13.0–17.0)

## 2022-06-06 SURGERY — COLONOSCOPY WITH PROPOFOL
Anesthesia: General

## 2022-06-06 MED ORDER — PROPOFOL 10 MG/ML IV BOLUS
INTRAVENOUS | Status: DC | PRN
  Administered 2022-06-06: 60 mg via INTRAVENOUS

## 2022-06-06 MED ORDER — PHENYLEPHRINE 80 MCG/ML (10ML) SYRINGE FOR IV PUSH (FOR BLOOD PRESSURE SUPPORT)
PREFILLED_SYRINGE | INTRAVENOUS | Status: DC | PRN
  Administered 2022-06-06: 80 ug via INTRAVENOUS

## 2022-06-06 MED ORDER — PROPOFOL 500 MG/50ML IV EMUL
INTRAVENOUS | Status: DC | PRN
  Administered 2022-06-06: 75 ug/kg/min via INTRAVENOUS

## 2022-06-06 MED ORDER — TROLAMINE SALICYLATE 10 % EX CREA
TOPICAL_CREAM | Freq: Two times a day (BID) | CUTANEOUS | Status: DC
  Administered 2022-06-24 – 2022-06-29 (×4): 1 via TOPICAL
  Filled 2022-06-06 (×2): qty 85

## 2022-06-06 MED ORDER — SODIUM CHLORIDE 0.9 % IV SOLN: INTRAVENOUS | Status: DC

## 2022-06-06 MED ORDER — PHENYLEPHRINE 80 MCG/ML (10ML) SYRINGE FOR IV PUSH (FOR BLOOD PRESSURE SUPPORT)
PREFILLED_SYRINGE | INTRAVENOUS | Status: AC
  Filled 2022-06-06: qty 10

## 2022-06-06 NOTE — TOC Progression Note (Signed)
Transition of Care [facility]) - Progression Note    Patient Details  Name: [name] MRN: [id number] Date of Birth: [birth year]  Transition of Care [facility]) CM/SW Contact  Beverly Sessions, RN Phone Number: 06/06/2022, 4:12 PM  Clinical Narrative:    [name] at [facility] confirms they can offer a bed. Message left for daughter  to see if she would like to accept    Expected Discharge Plan: [place] Barriers to Discharge: Continued Medical Work up  Expected Discharge Plan and Services Expected Discharge Plan: [place]   Discharge Planning Services: CM Consult Post Acute Care Choice: Resumption of Svcs/PTA Provider, [place] Living arrangements for the past 2 months: [place]                 DME Arranged: N/A DME Agency: NA       HH Arranged: NA HH Agency: NA         Social Determinants of Health (SDOH) Interventions    Readmission Risk Interventions    06/02/2022   12:02 PM 10/17/2021   10:47 AM  Readmission Risk Prevention Plan  Transportation Screening Complete Complete  PCP or Specialist Appt within 3-5 Days  Complete  Social Work Consult for [place] Planning/Counseling  Complete  Palliative Care Screening  Not Applicable  Medication Review Press photographer) Complete Complete  PCP or Specialist appointment within 3-5 days of discharge Complete   HRI or Home Care Consult Complete   SW Recovery Care/Counseling Consult Complete   Palliative Care Screening Complete   Skilled Nursing Facility Complete

## 2022-06-06 NOTE — TOC Progression Note (Signed)
Transition of Care [facility]) - Progression Note    Patient Details  Name: MARVION BASTIDAS MRN: [id number] Date of Birth: [birth year]  Transition of Care [facility]) CM/SW Contact  Beverly Sessions, RN Phone Number: 06/06/2022, 12:09 PM  Clinical Narrative:    [facility] has not responded in the HUB if they can offer a bed.  Reached out to [place] again requesting review    Expected Discharge Plan: [place] Barriers to Discharge: Continued Medical Work up  Expected Discharge Plan and Services Expected Discharge Plan: [place]   Discharge Planning Services: CM Consult Post Acute Care Choice: Resumption of Svcs/PTA Provider, [place] arrangements for the past 2 months: [place]                 DME Arranged: N/A DME Agency: NA       HH Arranged: NA HH Agency: NA         Social Determinants of Health (SDOH) Interventions    Readmission Risk Interventions    06/02/2022   12:02 PM 10/17/2021   10:47 AM  Readmission Risk Prevention Plan  Transportation Screening Complete Complete  PCP or Specialist Appt within 3-5 Days  Complete  Social Work Consult for [place] Planning/Counseling  Complete  Palliative Care Screening  Not Applicable  Medication Review Press photographer) Complete Complete  PCP or Specialist appointment within 3-5 days of discharge Complete   HRI or [place] Complete   SW Recovery Care/Counseling Consult Complete   Palliative Care Screening Complete   Skilled Nursing Facility Complete

## 2022-06-06 NOTE — Progress Notes (Signed)
Palliative: Calvin [name] is lying quietly in bed.  He appears acutely/chronically ill and somewhat frail.  He is alert, will briefly make but not keep eye contact.  He again today declines to communicate with me although I believe that he is alert and oriented and able to do so.  I believe that he can make his basic needs known.  Bedside nursing staff is present attending to needs.  Calvin [name] and I talk about the plan for today with colonoscopy for decompression of the colon and MBBS tomorrow.  He will briefly look at me but not speak.   I shared that he "looks angry".  He responds, "I am angry".  I greatly encouraged him to make his wants and desires no known.    Plan:   Continue to treat, time for outcomes.  Calvin [name] is a resident of long-term care at [facility].  His daughter is requesting that he be transferred to [facility] if possible, but is agreeable to return to [facility] if necessary.  25 minutes  [name], NP Palliative medicine team Team phone [phone] Greater than 50% of this time was spent counseling and coordinating care related to the above assessment and plan.

## 2022-06-06 NOTE — Progress Notes (Signed)
Pt has an order for PICC placement but pt refused PICC to be placed tonight. PICC team will follow up in the morning 06/07/2022. RN unit aware.

## 2022-06-06 NOTE — Transfer of Care (Signed)
Immediate Anesthesia Transfer of Care Note  Patient: Calvin [name]  Procedure(s) Performed: Procedure(s): COLONOSCOPY WITH PROPOFOL (N/A)  Patient Location: PACU and Endoscopy Unit  Anesthesia Type:General  Level of Consciousness: sedated  Airway & Oxygen Therapy: Patient Spontanous Breathing and Patient connected to nasal cannula oxygen  Post-op Assessment: Report given to RN and Post -op Vital signs reviewed and stable  Post vital signs: Reviewed and stable  Last Vitals:  Vitals:   06/06/22 1318 06/06/22 1418  BP: (!) 165/86 139/89  Pulse: 74 84  Resp: 14 16  Temp: 36.5 C (!) 36.2 C  SpO2: 27% 06%    Complications: No apparent anesthesia complications

## 2022-06-06 NOTE — Progress Notes (Signed)
Progress Note   Patient: Calvin [name] TKW:[id number] DOB: 1950/10/31 DOA: 05/28/2022     9 DOS: the patient was seen and examined on 06/06/2022   Brief hospital course: Taken from H&P.  Calvin [name] is a 71 y.o. male with medical history significant of Ogilvie's syndrome, ileus, hypertension, hyperlipidemia, COPD, stroke with mild left-sided weakness, GERD, gout, depression with anxiety, seizure, CAD, MI,  alcohol abuse, CKD stage IIIa, dCHF, colon cancer, thyroid cancer, who presents from his facility with Nausea, vomiting, abdominal distention, abdominal pain for 2 days. Found to have new small bowel obstruction on CT abdomen with transition point in the right lower abdomen suspicious for adhesion. General surgery was consulted and NG tube was placed.  Patient also developed left-sided tingling and numbness and facial droop while in ED, has an history of left-sided stroke with residual weakness.  Symptoms resolved within few minutes. CT head and MRI brain was negative for any acute intracranial abnormality and do reflect a chronic changes. 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 Neurology was consulted-patient to complete stroke work-up.  9/25: Labs with mildly elevated creatinine at 1.26 with baseline seems to be around 1.1-1.2.  Lipid panel normal with LDL of 57 CBC with mild leukocytosis at 11.1.  Hemoglobin stable,A1c 5.8 Repeat abdominal x-ray with stable obstructive findings and no other acute abnormality.  Continued to have significant colonic distention.  Passing flatus, minimum secretions with NG tube. general surgery would like to continue with conservative management with NG tube. Speech and cognition appears to be at baseline per speech evaluation. Patient uses quite a bit of opioids for chronic pain which can be contributory.  Try avoiding opioids if possible. We can try a Toradol as needed for pain with a close monitoring of renal function.  9/26: Patient  overnight complained about chest pain, on evaluation by night cross coverage, he was complaining more of generalized aches and pain but did responded to nitroglycerin x1.  EKG with sinus tachycardia, troponin 20>> 23. Patient was given a dose of morphine.  Renal function with slight worsening of creatinine to 1.41 on 05/30/2022.  We will avoid Toradol and pain will be managed with morphine although can contribute to his slower bowel function.  Repeat abdominal x-ray with air-filled distention of large and small bowel loops favoring ileus. CT scan on 06/01/2022 did not show any signs of obstruction, small bowel last dilated.  Still had dilation of the colon.  The patient was transferred to the stepdown unit for neostigmine dosing on 05/31/2022 and 06/01/2022.    06/02/2022 had colonoscopy to decompress air.  9/30 and 06/04/2022.  Unfortunately we do not have speech therapy over the weekend.  Continue NG tube feeding.  Swallow evaluation on Monday.  Transfer out of stepdown unit on 06/04/2022.  06/05/2022.  Case discussed with gastroenterology and they will do a another decompressive colonoscopy on 06/06/2022.  Case discussed with speech therapy and modified barium swallow will happen on 06/07/2022.  NG tube will have to be out at that time.  06/06/2022.  Colonoscopy performed to remove hair.  Rectal tube placed.  Will order PICC line and TPN.  Modified barium swallow tomorrow.  Assessment and Plan: Ogilvie's syndrome General surgery does not think this is bowel obstruction.  Gastroenterology gave neostigmine infusion on 05/31/2022 and on 06/01/22.  Decompressive colonoscopy procedure on 06/02/2022.   MiraLAX via ngt twice daily.  Gastroenterology did a decompressive colonoscopy procedure on 06/06/2022 and left a rectal tube in.  Speech therapy  team to Modified barium swallow on 06/07/2022.  We will order for PICC line and TPN.  Paroxysmal atrial flutter (HCC) Continue metoprolol via NG tube.  Heparin drip  held this morning for procedure.  Hopefully can restart later today.  hemoglobin drifting a little bit continue to watch for any signs of bleeding. Hopefully can switch over to Eliquis if passes swallow evaluation.  Was in normal sinus rhythm this morning.  Aspiration pneumonia ([place]) Completed 5 days of Zosyn on 06/05/2022.  Cervical spinal stenosis MRI showing multilevel cervical spondylosis most pronounced at C3-C4 where moderate to severe canal stenosis and severe bilateral foraminal stenosis.  Patient is not a great surgical candidate at this point.  Case discussed with Dr. Izora Ribas.  Respiratory distress Respiratory distress on 06/01/2022 with upper airway congestion.  Improved with Decadron and racemic epinephrine infusion.  Currently not on oxygen.  Chronic diastolic CHF (congestive heart failure) (HCC) -Last EF 50% -Hold Lasix   Seizures (HCC) Seizure -Seizure precaution -When necessary Ativan for seizure -IV Keppra and Depakote.    HTN (hypertension) Continue metoprolol via NG tube.  CAD (coronary artery disease) No chest pain.  Troponin negative x2 -Hold aspirin and Zocor.  Continue metoprolol  HLD (hyperlipidemia) Hold Zocor  Chronic kidney disease, stage 3a (HCC) Last creatinine 1.09 with a GFR greater than 60  COPD (chronic obstructive pulmonary disease) (HCC) Continue nebulizers  Coffee ground emesis No recurrence of vomiting.  Watch closely with blood thinner.  Hemoglobin 11.7 today.  Tobacco abuse -nicotine patch  Depression with anxiety Hold home oral Xanax and Zoloft -As needed IV Ativan for anxiety  Neuropathy Continue gabapentin        Subjective: Patient more interactive today.  More alert and answering all questions.  Complains of some pain in his left arm.  He was asking for some Aspercreme.  History of neuropathy.  Came in with abdominal distention.  Physical Exam: Vitals:   06/06/22 0745 06/06/22 1318 06/06/22 1418 06/06/22 1438   BP: (!) 141/77 (!) 165/86 139/89   Pulse: 76 74 84 78  Resp: '14 14 16   '$ Temp: 98.2 F (36.8 C) 97.7 F (36.5 C) (!) 97.2 F (36.2 C)   TempSrc:  Temporal Temporal   SpO2: 97% 96% 99% 100%  Weight:      Height:       Physical Exam HENT:     Head: Normocephalic.     Mouth/Throat:     Pharynx: No oropharyngeal exudate.  Eyes:     General: Lids are normal.     Conjunctiva/sclera: Conjunctivae normal.  Cardiovascular:     Rate and Rhythm: Normal rate and regular rhythm.     Heart sounds: Normal heart sounds, S1 normal and S2 normal.  Pulmonary:     Breath sounds: No decreased breath sounds, wheezing, rhonchi or rales.  Abdominal:     General: There is distension.     Palpations: Abdomen is soft.     Tenderness: There is no abdominal tenderness.  Musculoskeletal:     Right lower leg: Swelling present.     Left lower leg: Swelling present.  Skin:    General: Skin is warm.     Findings: No rash.  Neurological:     Mental Status: He is alert.     Data Reviewed: Creatinine 1.09, hemoglobin 11.8   Family Communication: Updated patient's daughter on the phone  Disposition: Status is: Inpatient Remains inpatient appropriate because: We will get PICC line and start TPN.  Second decompressive  colonoscopy done today and rectal tube left in.  Planned Discharge Destination: Rehab    Time spent: 28 minutes  Author: Loletha Grayer, MD 06/06/2022 2:49 PM  For on call review [email or web address].

## 2022-06-06 NOTE — Care Plan (Signed)
Repeat colonic decompression performed. Tube was clipped into place. Recommend keeping tube to low-intermittent suction and tube should be flushed every 4-6 hours with miralax solution or normal saline (about 60 ml each time). Ideally would like to keep tube in place at least 24 hours but tube has high chance of being dislodge.  Raylene Miyamoto MD, MPH [place]

## 2022-06-06 NOTE — Anesthesia Procedure Notes (Signed)
Date/Time: 06/06/2022 1:55 PM  Performed by: Doreen Salvage, CRNAPre-anesthesia Checklist: Patient identified, Emergency Drugs available, Suction available and Patient being monitored Patient Re-evaluated:Patient Re-evaluated prior to induction Oxygen Delivery Method: Simple face mask Induction Type: IV induction Dental Injury: Teeth and Oropharynx as per pre-operative assessment

## 2022-06-06 NOTE — Op Note (Addendum)
[facility] [name] Procedure Date: 06/06/2022 12:53 PM MRN: [id number] Account #: [id number] Date of Birth: 1950/11/30 Admit Type: Inpatient Age: 71 Room: [facility] ENDO ROOM 1 Gender: Male Note Status: Finalized Instrument Name: [name] [id number] Procedure:             Colonoscopy Indications:           Therapeutic procedure Providers:             Andrey Farmer MD, MD Medicines:             Monitored Anesthesia Care Complications:         No immediate complications. Procedure:             Pre-Anesthesia Assessment:                        - Prior to the procedure, a History and Physical was                         performed, and patient medications and allergies were                         reviewed. The patient is competent. The risks and                         benefits of the procedure and the sedation options and                         risks were discussed with the patient. All questions                         were answered and informed consent was obtained.                         Patient identification and proposed procedure were                         verified by the physician, the nurse, the                         anesthesiologist, the anesthetist and the technician                         in the endoscopy suite. Mental Status Examination:                         alert and oriented. Airway Examination: normal                         oropharyngeal airway and neck mobility. Respiratory                         Examination: clear to auscultation. CV Examination:                         normal. Prophylactic Antibiotics: The patient does not                         require prophylactic antibiotics. Prior  Anticoagulants: The patient has taken heparin, last                         dose was day of procedure. ASA Grade Assessment: IV -                         A patient with severe systemic disease that  is a                         constant threat to life. After reviewing the risks and                         benefits, the patient was deemed in satisfactory                         condition to undergo the procedure. The anesthesia                         plan was to use monitored anesthesia care (MAC).                         Immediately prior to administration of medications,                         the patient was re-assessed for adequacy to receive                         sedatives. The heart rate, respiratory rate, oxygen                         saturations, blood pressure, adequacy of pulmonary                         ventilation, and response to care were monitored                         throughout the procedure. The physical status of the                         patient was re-assessed after the procedure.                        After obtaining informed consent, the colonoscope was                         passed under direct vision. Throughout the procedure,                         the patient's blood pressure, pulse, and oxygen                         saturations were monitored continuously. The                         Colonoscope was introduced through the anus and                         advanced to the the transverse colon  for evaluation.                         This was the intended extent. The colonoscopy was                         somewhat difficult due to inadequate bowel prep. The                         patient tolerated the procedure well. The quality of                         the bowel preparation was poor. Findings:      The perianal and digital rectal examinations were normal.      The lumen of the rectum, sigmoid colon, descending colon, splenic       flexure, transverse colon and hepatic flexure was significantly dilated.       A colonic decompression tube was placed, this was clipped into place       with an endoscopic clip. As scope was withdrawn, the colon was        decompressed. Impression:            - Preparation of the colon was poor.                        - Dilated in the rectum, in the sigmoid colon, in the                         descending colon, at the splenic flexure, in the                         transverse colon and at the hepatic flexure.                        - No specimens collected. Recommendation:        - Return patient to hospital ward for ongoing care.                        - Advance diet as tolerated.                        - Recommend placing decompression tube to                         low-intermittent suction. It should be flushed every                         4-6 hours with saline or miralax solution. Procedure Code(s):     --- Professional ---                        [phone], 42, Colonoscopy, flexible; with decompression                         (for pathologic distention) (eg, volvulus, megacolon),                         including placement of decompression tube, when  performed Diagnosis Code(s):     --- Professional ---                        K59.39, Other megacolon CPT copyright 2019 American Medical Association. All rights reserved. The codes documented in this report are preliminary and upon coder review may  be revised to meet current compliance requirements. Andrey Farmer MD, MD 06/06/2022 2:17:50 PM Number of Addenda: 0 Note Initiated On: 06/06/2022 12:53 PM Total Procedure Duration: 0 hours 9 minutes 37 seconds  Estimated Blood Loss:  Estimated blood loss: none.      [facility]

## 2022-06-06 NOTE — Anesthesia Preprocedure Evaluation (Signed)
Anesthesia Evaluation  Patient identified by MRN, date of birth, ID band Patient awake    Reviewed: Allergy & Precautions, NPO status , Patient's Chart, lab work & pertinent test results  Airway Mallampati: III  TM Distance: >3 FB Neck ROM: full    Dental  (+) Chipped, Poor Dentition, Missing   Pulmonary COPD, Current Smoker and Patient abstained from smoking.,    Pulmonary exam normal        Cardiovascular hypertension, (-) angina+ CAD, + Past MI and +CHF  Normal cardiovascular exam     Neuro/Psych Seizures -,  PSYCHIATRIC DISORDERS  Neuromuscular disease CVA    GI/Hepatic negative GI ROS, Neg liver ROS, neg GERD  ,  Endo/Other  negative endocrine ROS  Renal/GU Renal disease  negative genitourinary   Musculoskeletal   Abdominal   Peds  Hematology negative hematology ROS (+)   Anesthesia Other Findings Past Medical History: No date: Alcohol abuse     Comment:  drinks on weekend No date: Anemia No date: Anxiety No date: Arthritis No date: Cancer ([place])     Comment:  colon,throat No date: COPD (chronic obstructive pulmonary disease) (HCC) No date: Coronary artery disease No date: Depression No date: Gout No date: Hypertension No date: Myocardial infarction ([place]) No date: Neuromuscular disorder ([place]) No date: Seizures ([place])     Comment:  last 6 months ago No date: Stroke [facility])     Comment:  multiple  left side weakness No date: Tremors of nervous system  Past Surgical History: 10/19/2015: CARPAL TUNNEL RELEASE; Left     Comment:  Procedure: CARPAL TUNNEL RELEASE;  Surgeon: Hessie Knows, MD;  Location: [facility] ORS;  Service: Orthopedics;                Laterality: Left; No date: COLON SURGERY 10/26/2021: COLONOSCOPY WITH PROPOFOL; N/A     Comment:  Procedure: COLONOSCOPY WITH PROPOFOL;  Surgeon:               Lesly Rubenstein, MD;  Location: [facility] ENDOSCOPY;                Service: Endoscopy;   Laterality: N/A; 06/02/2022: COLONOSCOPY WITH PROPOFOL; N/A     Comment:  Procedure: COLONOSCOPY WITH PROPOFOL;  Surgeon:               Lesly Rubenstein, MD;  Location: [facility] ENDOSCOPY;                Service: Endoscopy;  Laterality: N/A; No date: JOINT REPLACEMENT     Comment:  left partial hip  2013: THROAT SURGERY     Comment:  cancer  BMI    Body Mass Index: 29.55 kg/m      Reproductive/Obstetrics negative OB ROS                             Anesthesia Physical Anesthesia Plan  ASA: 3  Anesthesia Plan: General   Post-op Pain Management:    Induction: Intravenous  PONV Risk Score and Plan: Propofol infusion and TIVA  Airway Management Planned: Natural Airway and Nasal Cannula  Additional Equipment:   Intra-op Plan:   Post-operative Plan:   Informed Consent: I have reviewed the patients History and Physical, chart, labs and discussed the procedure including the risks, benefits and alternatives for the proposed anesthesia with the patient or authorized representative who has indicated  his/her understanding and acceptance.     Dental Advisory Given  Plan Discussed with: Anesthesiologist, CRNA and Surgeon  Anesthesia Plan Comments: (Patient consented for risks of anesthesia including but not limited to:  - adverse reactions to medications - risk of airway placement if required - damage to eyes, teeth, lips or other oral mucosa - nerve damage due to positioning  - sore throat or hoarseness - Damage to heart, brain, nerves, lungs, other parts of body or loss of life  Patient voiced understanding.)        Anesthesia Quick Evaluation

## 2022-06-06 NOTE — Consult Note (Addendum)
PHARMACY - TOTAL PARENTERAL NUTRITION CONSULT NOTE   Indication:  Intolerance to enteral feeding  Patient Measurements: Height: '5\' 11"'$  (180.3 cm) Weight: 96.1 kg (211 lb 13.8 oz) IBW/kg (Calculated) : 75.3 TPN AdjBW (KG): 80.5 Body mass index is 29.55 kg/m.  Assessment:  Patient is a 71 y/o M with medical history including Ogilvie's syndrome, HTN, HLD, COPD, CVA, GERD, gout, depression / anxiety, seizures, CAD, EtOH use disorder, CKD, diastolic CHF, colon cancer, thyroid cancer who is admitted with resolved SBO but now with worsening pseudoobstruction. GI consulted. Patient failed to respond to conservative measures with methylnaltrexone and neostigmine x 2. Patient underwent decompressive colonoscopy on 6/65 complicated by colonic distension post-procedurally. Patient underwent repeat decompressive colonoscopy on 10/3. Patient has been on tube feeds at trickle rate. Patient is scheduled to undergo MBSS on 10/4 with SLP. Pharmacy consulted to initiate TPN to help meet nutritional goals given acute abdominal issues and intolerance to enteral nutrition.  Glucose / Insulin: Pre-diabetic range Hgb A1c (5.8% on 05/28/22). Patient has been overall normoglycemic this admission. Electrolytes: Within normal limits. Patient is at high re-feeding risk Renal: Stable CKD, Scr ~ 1.2 Hepatic: Within normal limits Intake / Output; MIVF: +6.4L for the admission. No MIVF running GI Imaging: 9/24 CT abdomen / pelvis: SBO and chronic colonic ileus or pseudo-obstruction 9/26 Abdominal X-ray: Air-filled distention of large and small bowel loops favoring ileus 9/27 CT abdomen / pelvis: No evidence of SBO 9/29 Abdominal X-ray: Severe dilatation of the colon as can be seen with pseudo-obstruction/colonic ileus 9/30 Abdominal X-ray: Significant interval improvement in colonic distension, which has mostly resolved 10/1 Abdominal X-ray: Persistent gaseous dilation of the colon, though mildly improved compared to  06/02/2022 10/2 Abdominal X-ray: Worsened gaseous distention of the colon GI Surgeries / Procedures:  9/29: Decompressive colonoscopy 10/3: Decompressive colonoscopy  Central access: Pending 10/3 TPN start date: Pending 10/4  Nutritional Goals: Goal TPN rate is 80 mL/hr (provides 105 g of protein and 2042 kcals per day)  RD Assessment: Estimated Needs Total Energy Estimated Needs: 1900-2200kcal/day Total Protein Estimated Needs: 95-110g/day Total Fluid Estimated Needs: 1.9-2.2L/day  Current Nutrition:  Tube feeding (trickle rate) MBSS planned 10/4  Plan:  --Start TPN at 40 mL/hr (1/2 rate) at 1800 on 10/4 Goal rate TPN will provide: Protein: 105 g (55 g/L) Dextrose: 307 g (16%) Lipids: 57.6 g (30 g/L) Kcals / day: 2042 Fluids: 1.92 L --Electrolytes in TPN: Na 81mq/L, K 583m/L, Ca 72m17mL, Mg 72mE33m, and Phos 172mm372m. Cl:Ac 1:1 --Add standard MVI and trace elements to TPN, thiamine 100 mg daily x 3 days --Initiate Moderate q6h SSI and adjust as needed  --Monitor TPN labs on Mon/Thurs, daily until stable  Ritvik Mczeal Benita Gutter/2023,2:58 PM

## 2022-06-07 ENCOUNTER — Encounter: Payer: Self-pay | Admitting: Gastroenterology

## 2022-06-07 ENCOUNTER — Inpatient Hospital Stay: Payer: Medicare Other

## 2022-06-07 DIAGNOSIS — I4892 Unspecified atrial flutter: Secondary | ICD-10-CM | POA: Diagnosis not present

## 2022-06-07 DIAGNOSIS — R1310 Dysphagia, unspecified: Secondary | ICD-10-CM

## 2022-06-07 DIAGNOSIS — J690 Pneumonitis due to inhalation of food and vomit: Secondary | ICD-10-CM | POA: Diagnosis not present

## 2022-06-07 DIAGNOSIS — K5981 Ogilvie syndrome: Secondary | ICD-10-CM | POA: Diagnosis not present

## 2022-06-07 DIAGNOSIS — Z515 Encounter for palliative care: Secondary | ICD-10-CM | POA: Diagnosis not present

## 2022-06-07 DIAGNOSIS — K56609 Unspecified intestinal obstruction, unspecified as to partial versus complete obstruction: Secondary | ICD-10-CM | POA: Diagnosis not present

## 2022-06-07 LAB — GLUCOSE, CAPILLARY
Glucose-Capillary: 85 mg/dL (ref 70–99)
Glucose-Capillary: 124 mg/dL — ABNORMAL HIGH (ref 70–99)

## 2022-06-07 LAB — BASIC METABOLIC PANEL
Anion gap: 4 — ABNORMAL LOW (ref 5–15)
BUN: 13 mg/dL (ref 8–23)
CO2: 28 mmol/L (ref 22–32)
Calcium: 8.6 mg/dL — ABNORMAL LOW (ref 8.9–10.3)
Chloride: 106 mmol/L (ref 98–111)
Creatinine, Ser: 1.03 mg/dL (ref 0.61–1.24)
GFR, Estimated: >60 mL/min (ref >60)
Glucose, Bld: 113 mg/dL — ABNORMAL HIGH (ref 70–99)
Potassium: 4.2 mmol/L (ref 3.5–5.1)
Sodium: 138 mmol/L (ref 135–145)

## 2022-06-07 LAB — MAGNESIUM: Magnesium: 2 mg/dL (ref 1.7–2.4)

## 2022-06-07 LAB — PHOSPHORUS: Phosphorus: 3.7 mg/dL (ref 2.5–4.6)

## 2022-06-07 MED ORDER — CHLORHEXIDINE GLUCONATE CLOTH 2 % EX PADS
6.0000 | MEDICATED_PAD | Freq: Every day | CUTANEOUS | Status: DC
  Administered 2022-06-07 – 2022-06-30 (×24): 6 via TOPICAL

## 2022-06-07 MED ORDER — SODIUM CHLORIDE 0.9% FLUSH
10.0000 mL | Freq: Two times a day (BID) | INTRAVENOUS | Status: DC
  Administered 2022-06-07 – 2022-06-17 (×18): 10 mL
  Administered 2022-06-17 – 2022-06-18 (×2): 20 mL
  Administered 2022-06-18 – 2022-06-23 (×9): 10 mL
  Administered 2022-06-23: 20 mL
  Administered 2022-06-24 – 2022-06-30 (×13): 10 mL

## 2022-06-07 MED ORDER — TRAVASOL 10 % IV SOLN
INTRAVENOUS | Status: AC
  Filled 2022-06-07: qty 528

## 2022-06-07 MED ORDER — INSULIN ASPART 100 UNIT/ML IJ SOLN
0.0000 [IU] | Freq: Four times a day (QID) | INTRAMUSCULAR | Status: DC
  Administered 2022-06-08 (×2): 1 [IU] via SUBCUTANEOUS
  Administered 2022-06-09: 2 [IU] via SUBCUTANEOUS
  Administered 2022-06-09 – 2022-06-16 (×17): 1 [IU] via SUBCUTANEOUS
  Administered 2022-06-16: 2 [IU] via SUBCUTANEOUS
  Administered 2022-06-17 (×2): 1 [IU] via SUBCUTANEOUS
  Administered 2022-06-17: 2 [IU] via SUBCUTANEOUS
  Administered 2022-06-18 – 2022-06-28 (×28): 1 [IU] via SUBCUTANEOUS
  Filled 2022-06-07 (×52): qty 1

## 2022-06-07 MED ORDER — APIXABAN 5 MG PO TABS: 5.0000 mg | ORAL_TABLET | Freq: Two times a day (BID) | ORAL | Status: DC

## 2022-06-07 MED ORDER — SODIUM CHLORIDE 0.9% FLUSH
10.0000 mL | INTRAVENOUS | Status: DC | PRN
  Administered 2022-06-23 – 2022-06-28 (×3): 10 mL

## 2022-06-07 NOTE — Plan of Care (Signed)

## 2022-06-07 NOTE — Progress Notes (Signed)
Peripherally Inserted Central Catheter Placement  The IV Nurse has discussed with the patient and/or persons authorized to consent for the patient, the purpose of this procedure and the potential benefits and risks involved with this procedure.  The benefits include less needle sticks, lab draws from the catheter, and the patient may be discharged home with the catheter. Risks include, but not limited to, infection, bleeding, blood clot (thrombus formation), and puncture of an artery; nerve damage and irregular heartbeat and possibility to perform a PICC exchange if needed/ordered by physician.  Alternatives to this procedure were also discussed.  Bard Power PICC patient education guide, fact sheet on infection prevention and patient information card has been provided to patient /or left at bedside.    PICC Placement Documentation  PICC Double Lumen 28/76/81 Right Basilic 40 cm 0 cm (Active)  Indication for Insertion or Continuance of Line Administration of hyperosmolar/irritating solutions (i.e. TPN, Vancomycin, etc.) 06/07/22 1327  Exposed Catheter (cm) 0 cm 06/07/22 1327  Site Assessment Clean, Dry, Intact 06/07/22 1327  Lumen #1 Status Flushed;Saline locked;Blood return noted 06/07/22 1327  Lumen #2 Status Flushed;Saline locked;Blood return noted 06/07/22 1327  Dressing Type Transparent;Securing device 06/07/22 1327  Dressing Status Antimicrobial disc in place;Clean, Dry, Intact 06/07/22 1327  Safety Lock Not Applicable 15/72/62 0355  Line Care Connections checked and tightened 06/07/22 1327  Line Adjustment (NICU/IV Team Only) No 06/07/22 1327  Dressing Intervention New dressing;Other (Comment) 06/07/22 1327  Dressing Change Due 06/14/22 06/07/22 [place] 06/07/2022, 1:29 PM

## 2022-06-07 NOTE — Progress Notes (Signed)
Palliative: Calvin [name] is lying quietly in bed with PT at bedside.  PMT to return later in the day.  I returned later in the day to find Calvin [name] lying quietly in bed.  He appears acutely/chronically ill and very frail.  Unlike other days, he greets me, making and somewhat keeping eye contact.  He is alert, oriented to self and situation.  We talk about his acute health problems in particular his swallowing.  I asked how he is swallowing and he states, "pretty good".  We talk about his swallow study this morning which she does remember.  He goes further to state that he was shown the x-rays and that "looked okay" to him.  We talk about his risk for aspiration and risk for pneumonia.  He does remember speaking with speech therapy about pneumonia.    We talk about a tube through the skin of the stomach to feed him if he is unable to eat.  He considers this, but it is clear he is unable to make a decision at this time.  It seems that he has been unable to tolerate tube feeds at goal due to distention.  We talk about CODE STATUS, "treat the treatable, but no CPR or intubation".  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  I reassure him that the medical team is aware and would not go against his wishes.  He seems to be considering CODE STATUS.  Remains full code at this time.  Conference with attending, bedside nursing staff, transition of care team related to patient condition, needs, goals of care, disposition.  Plan: PICC line placed today, to start TPN this evening.  Evaluation by surgery for PEG tube.   73 minutes  [name], NP Palliative medicine team Team phone [phone] Greater than 50% of this time was spent counseling and coordinating care related to the above assessment and plan.

## 2022-06-07 NOTE — TOC Progression Note (Addendum)
Transition of Care [facility]) - Progression Note    Patient Details  Name: Calvin [name] MRN: [id number] Date of Birth: [birth year]  [facility] CM/SW Contact  Beverly Sessions, RN Phone Number: 06/07/2022, 9:34 AM  Clinical Narrative:    Spoke with daughter [name].  She accept bed at [facility].  Accepted in [place] and notified [name] at [facility]   Secure chat sent to MD "We have a bed for him at discharge We will need a 2 day notice for when he is ready for discharge so the facility can start authorization  "   Expected Discharge Plan: [place] Barriers to Discharge: Continued Medical Work up  Expected Discharge Plan and Services Expected Discharge Plan: [place]   Discharge Planning Services: CM Consult Post Acute Care Choice: Resumption of Svcs/PTA Provider, [place] Living arrangements for the past 2 months: [place]                 DME Arranged: N/A DME Agency: NA       HH Arranged: NA HH Agency: NA         Social Determinants of Health (SDOH) Interventions    Readmission Risk Interventions    06/02/2022   12:02 PM 10/17/2021   10:47 AM  Readmission Risk Prevention Plan  Transportation Screening Complete Complete  PCP or Specialist Appt within 3-5 Days  Complete  Social Work Consult for [place] Planning/Counseling  Complete  Palliative Care Screening  Not Applicable  Medication Review [employer] photographer) Complete Complete  PCP or Specialist appointment within 3-5 days of discharge Complete   HRI or Home Care Consult Complete   SW Recovery Care/Counseling Consult Complete   Palliative Care Screening Complete   Skilled Nursing Facility Complete

## 2022-06-07 NOTE — Progress Notes (Signed)
GI Inpatient Follow-up Note  Subjective:  Patient seen and doing well. Just got his PICC line. NG tube has been pulled. No bowel movements currently.  Scheduled Inpatient Medications:   budesonide (PULMICORT) nebulizer solution  0.5 mg Nebulization BID   Chlorhexidine Gluconate Cloth  6 each Topical Daily   ciprofloxacin  1 drop Both Eyes Q4H while awake   insulin aspart  0-9 Units Subcutaneous Q6H   ipratropium-albuterol  3 mL Nebulization BID   lidocaine HCl (PF)  10 mL Other Once   LORazepam  1 mg Intravenous QHS   multivitamin  15 mL Per Tube Daily   nicotine  21 mg Transdermal Daily   pantoprazole (PROTONIX) IV  40 mg Intravenous Q12H   polyethylene glycol  17 g Per Tube BID   sodium chloride flush  10-40 mL Intracatheter Q59D   trolamine salicylate   Topical BID    Continuous Inpatient Infusions:    levETIRAcetam 1,000 mg (06/07/22 0753)   TPN ADULT (ION)     valproate sodium 250 mg (06/07/22 1115)    PRN Inpatient Medications:  acetaminophen **OR** acetaminophen (TYLENOL) oral liquid 160 mg/5 mL **OR** acetaminophen, albuterol, bisacodyl, hydrALAZINE, lidocaine, LORazepam, LORazepam, metoprolol tartrate, nitroGLYCERIN, ondansetron (ZOFRAN) IV, phenol, polyvinyl alcohol, senna-docusate, sodium chloride flush, trolamine salicylate  Review of Systems:  Review of Systems  Constitutional:  Negative for chills.  Respiratory:  Negative for shortness of breath.   Cardiovascular:  Negative for chest pain.  Gastrointestinal:  Negative for abdominal pain, blood in stool, constipation, diarrhea, melena, nausea and vomiting.  Skin:  Negative for itching and rash.  All other systems reviewed and are negative.     Physical Examination: BP 118/66   Pulse 74   Temp 98.3 F (36.8 C) (Oral)   Resp 18   Ht '5\' 11"'$  (1.803 m)   Wt 95.8 kg   SpO2 100%   BMI 29.46 kg/m  Gen: NAD, alert and oriented x 4 HEENT: PEERLA, EOMI, Neck: supple, no JVD or thyromegaly Chest: No  respiratory distress Abd: soft, non-tender, non-distended Ext: no edema, well perfused with 2+ pulses, Skin: no rash or lesions noted Lymph: no LAD  Data: Lab Results  Component Value Date   WBC 6.5 06/04/2022   HGB 11.8 (L) 06/06/2022   HCT 40.2 06/04/2022   MCV 97.3 06/04/2022   PLT 257 06/04/2022   Recent Labs  Lab 06/04/22 0444 06/05/22 0613 06/06/22 0433  HGB 12.3* 11.7* 11.8*   Lab Results  Component Value Date   NA 138 06/07/2022   K 4.2 06/07/2022   CL 106 06/07/2022   CO2 28 06/07/2022   BUN 13 06/07/2022   CREATININE 1.03 06/07/2022   Lab Results  Component Value Date   ALT 13 05/29/2022   AST 18 05/29/2022   ALKPHOS 61 05/29/2022   BILITOT 0.8 05/29/2022   Recent Labs  Lab 06/01/22 0740  APTT 35  INR 1.2   Assessment/Plan: Calvin [name] is a 71 y.o. gentleman with history of chronic ogilvie's syndrome, COPD, hypertension, hyperlipidemia, and multiple other medical comorbidities here with resolved SBO and now worsening pseudoobstruction we have been consulted for ogilvie's syndrome. Underwent decompressive colonoscopy on 9/29 but not surprisingly, colonic distension has recurred which can happen 40% of the time after decompressive colonoscopy. Underwent repeat decompressive colonoscopy on 10/3 with decompression tube left in place  Recommendations:  - continue decompression tube to low-intermittent suction but it needs to be flushed with normal saline every 4-6 hours. The tubes have high  incidences of becoming dislodged but it has been in place for 24 hours which is the goal but will continue to keep in place for now. - given unclear return of bowel function and inability to take in PO, TPN would be the most appropriate form of nutrition for him - avoid all narcotics - replete electrolytes aggressively - can give miralax through the decompression tube - aggressive PT/OT, out of bed to the chair  Please call with any questions or concerns  Calvin Miyamoto MD, MPH [place]

## 2022-06-07 NOTE — Progress Notes (Signed)
OT Cancellation Note  Patient Details Name: Calvin [name] MRN: [id number] DOB: 06-Jun-1951   Cancelled Treatment:    Reason Eval/Treat Not Completed: Patient at procedure or test/ unavailable. OT continues to follow pt for therapy services. Upon arrival to pt room, pt OTF, no bed in room. Will hold at this time and re-attempt at a later date/time as available and pt medically appropriate.   Of note, duplicate therapy orders received and acknowledged.   Shara Blazing, M.S., OTR/L 06/07/22, 10:00 AM

## 2022-06-07 NOTE — Progress Notes (Signed)
Pt tube feeding stopped and flushed at 0600 as per Dr. Marlena Clipper 06/07/22 5:58 AM

## 2022-06-07 NOTE — Evaluation (Addendum)
Objective Swallowing Evaluation: Type of Study: MBS-Modified Barium Swallow Study   Patient Details  Name: Calvin [name] MRN: [id number] Date of Birth: [birth year]  Today's Date: 06/07/2022 Time: SLP Start Time (ACUTE ONLY): 0900 -SLP Stop Time (ACUTE ONLY): 1030  SLP Time Calculation (min) (ACUTE ONLY): 90 min   Past Medical History:  Past Medical History:  Diagnosis Date   Alcohol abuse    drinks on weekend   Anemia    Anxiety    Arthritis    Cancer ([place])    colon,throat   COPD (chronic obstructive pulmonary disease) ([place])    Coronary artery disease    Depression    Gout    Hypertension    Myocardial infarction ([place])    Neuromuscular disorder ([place])    Seizures ([place])    last 6 months ago   Stroke [facility])    multiple  left side weakness   Tremors of nervous system    Past Surgical History:  Past Surgical History:  Procedure Laterality Date   CARPAL TUNNEL RELEASE Left 10/19/2015   Procedure: CARPAL TUNNEL RELEASE;  Surgeon: Hessie Knows, MD;  Location: [facility] ORS;  Service: Orthopedics;  Laterality: Left;   COLON SURGERY     COLONOSCOPY WITH PROPOFOL N/A 10/26/2021   Procedure: COLONOSCOPY WITH PROPOFOL;  Surgeon: Lesly Rubenstein, MD;  Location: [facility] ENDOSCOPY;  Service: Endoscopy;  Laterality: N/A;   COLONOSCOPY WITH PROPOFOL N/A 06/02/2022   Procedure: COLONOSCOPY WITH PROPOFOL;  Surgeon: Lesly Rubenstein, MD;  Location: [facility] ENDOSCOPY;  Service: Endoscopy;  Laterality: N/A;   COLONOSCOPY WITH PROPOFOL N/A 06/06/2022   Procedure: COLONOSCOPY WITH PROPOFOL;  Surgeon: Lesly Rubenstein, MD;  Location: [facility] ENDOSCOPY;  Service: Endoscopy;  Laterality: N/A;   JOINT REPLACEMENT     left partial hip    THROAT SURGERY  2013   cancer   HPI: Pt is 59 YOM admitted for small bowel obstruction and abdominal distention. PMH includes: CVA w L sided weakness, dCHF, seizures, CAD, HLD, CKD3a, COPD, tobacco abuse, depression, anxiety, and ogilive's syndrome.  Surgery consulted:  "abdominal pain, distension, nausea, and emesis thought to be secondary to possible small bowel obstruction also with stable appearance of chronic colonic distension, complicated by significant comorbid disease.".   CT of Abd: Lower chest: Stable small solid pulmonary nodule of the right lower  lobe measuring 5 mm on series 4, image 1. New mild linear nodular  opacity of the left lower lobe.  CXR on 06/05/2022: Enlarged cardiopericardial silhouette with pulmonary vascular  congestion.   Surgery is following for decompressive colonoscopy now; another is planned for 06/06/2022 per MD.   Subjective: pt awake, resting in bed. Less wet vocal quality noted. NGT removed. Pt verbal and followed directions adequately.    Recommendations for follow up therapy are one component of a multi-disciplinary discharge planning process, led by the attending physician.  Recommendations may be updated based on patient status, additional functional criteria and insurance authorization.  Assessment / Plan / Recommendation     06/07/2022   11:00 AM  Clinical Impressions  SLP Visit Diagnosis Dysphagia, oropharyngeal phase (R13.12)  Impact on safety and function Severe aspiration risk;Risk for inadequate nutrition/hydration   MBSS Results:  Pt appears to present w/ MOD-SEVERE oropharyngeal phase Dysphagia w/ Neuromuscular deficits resulting laryngeal penetration/aspiration at this MBSS today. Pt appears at HIGH risk for Aspiration (and for Pulmonary decline) w/ current swallowing presentation. Pt's MBSS results and recommendations were discussed w/ MD/Palliative Care and Team members post study.  Oral deficits c/b lengthy oral phase time w/ boluses of increased texture(puree, softened solid). There is rocking of the bolus during oral transfer and delay in A-P transfer w/ oral phase time of ~20 seconds w/ the puree consistency; longer for the softened solid. Disorganized bolus manipulation and control w/ thin and Nectar  liquid consistencies w/ premature spillage to the valleculae>pyriform sinuses. Oral and BOT residue remained post initial swallow.  Pharyngeal phase deficits c/b delayed pharyngeal swallow initiation which appeared to occur as the liquid boluses contacted the posterior surface of the epiglottis; valleculae w/ solids. Pharyngeal stage also noted for reduced hyolaryngeal excursion and anterior movement. Epiglottic deflection was reduced/partial; penetration/aspiration occurred fairly consistently during the swallow with thin and Nectar liquids due to reduced, tight laryngeal closure. Airway protection is reduced during the swallow. Pt had delayed or absent sensation of penetration/aspiration. MOD+ pharyngeal residue remains along the BOT, pharyngeal wall, valleculae, and pyriform sinuses post-swallow which resulted in MOD laryngeal vestibule residue collecting b/t trials = laryngeal penetration; trace-min amount of aspiration followed given Time b/t trials. Again, pt had delayed or absent sensation of penetration/aspiration. Pt able to reduce the pharyngeal residue with f/u, Dry swallows; laryngeal vestibule residue/penetration reoccurred d/t the pharyngeal residue remaining despite strategy.  During the swallow, a prominent-appearing cricopharyngeus muscle was noted. This was difficult to see d/t shoulders obscuring view.  Limited compensatory maneuvers were attempted d/t inconsistency of follow through and need for cues; not overly successful. The f/u, Dry swallow was most effective at reducing residue but could not clear it. Pt had to be CUED to throat clear/cough to clear laryngeal penetration/aspiration d/t the SILENT nature of awareness/sensation.  Due to pt's overall presentation, medical status, and HIGH risk for aspiration, recommend NPO status w/ discussion for alternative means of feeding. Pt does not appear to be at a stable medical place to safety participate in skilled ST dysphagia therapy d/t no  established nutrition route and extended illness (10+ days hospitalized) -- recommend f/u w/ skilled ST services for Dysphagia tx when pt's nutrition/hydration status' can be fully/safely established/ongoing for pt to be able to actively participate in skilled therapy. Recommend frequent oral care w/ NSG staff for hygiene and stimulation of oropharyngeal swallowing while NPO. Aspiration precautions; Supervision. The above was discussed w/ MD/Team via secure chat.          06/07/2022   11:00 AM  Treatment Recommendations  Treatment Recommendations --        06/07/2022   11:00 AM  Prognosis  Prognosis for Safe Diet Advancement Guarded  Barriers to Reach Goals Time post onset;Severity of deficits  Barriers/Prognosis Comment deconditioned; extended illness; dysphagia; no established nutrition/hydration for long term currently       06/07/2022   11:00 AM  Diet Recommendations  SLP Diet Recommendations NPO  Medication Administration Via alternative means         06/07/2022   11:00 AM  Other Recommendations  Recommended Consults --  Oral Care Recommendations Oral care QID;Staff/trained caregiver to provide oral care  Follow Up Recommendations Follow physician's recommendations for discharge plan and follow up therapies  Assistance recommended at discharge Frequent or constant Supervision/Assistance  Functional Status Assessment Patient has had a recent decline in their functional status and/or demonstrates limited ability to make significant improvements in function in a reasonable and predictable amount of time       06/07/2022   11:00 AM  Frequency and Duration   Speech Therapy Frequency (ACUTE ONLY) --  Treatment Duration --  06/07/2022   11:00 AM  Oral Phase  Oral Phase Impaired  Oral - Pudding Teaspoon NT  Oral - Nectar Teaspoon Incomplete tongue to palate contact;Delayed oral transit;Decreased bolus cohesion;Premature spillage  Oral - Nectar Straw Decreased bolus  cohesion;Premature spillage;Incomplete tongue to palate contact  Oral - Thin Teaspoon Incomplete tongue to palate contact;Delayed oral transit;Decreased bolus cohesion;Premature spillage  Oral - Thin Straw Incomplete tongue to palate contact;Decreased bolus cohesion;Premature spillage  Oral - Puree Impaired mastication;Incomplete tongue to palate contact;Reduced posterior propulsion;Lingual/palatal residue;Piecemeal swallowing;Decreased bolus cohesion;Delayed oral transit  Oral - Mech Soft Impaired mastication;Incomplete tongue to palate contact;Reduced posterior propulsion;Lingual/palatal residue;Piecemeal swallowing;Delayed oral transit;Decreased bolus cohesion  Oral Phase - Comment Lengthy oral phase time w/ increased textured trials(puree, soft solid)       06/07/2022   11:00 AM  Pharyngeal Phase  Pharyngeal Phase Impaired  Pharyngeal- Pudding Teaspoon NT  Pharyngeal- Nectar Teaspoon Delayed swallow initiation-vallecula;Reduced pharyngeal peristalsis;Reduced epiglottic inversion;Reduced anterior laryngeal mobility;Reduced laryngeal elevation;Reduced airway/laryngeal closure;Reduced tongue base retraction;Penetration/Aspiration during swallow;Penetration/Apiration after swallow;Pharyngeal residue - valleculae;Pharyngeal residue - pyriform;Pharyngeal residue - posterior pharnyx;Inter-arytenoid space residue  Pharyngeal- Nectar Straw Delayed swallow initiation-vallecula;Reduced pharyngeal peristalsis;Reduced epiglottic inversion;Reduced anterior laryngeal mobility;Reduced laryngeal elevation;Reduced airway/laryngeal closure;Reduced tongue base retraction;Penetration/Apiration after swallow;Pharyngeal residue - valleculae;Pharyngeal residue - pyriform;Pharyngeal residue - posterior pharnyx;Lateral channel residue  Pharyngeal- Thin Teaspoon Delayed swallow initiation-pyriform sinuses;Reduced pharyngeal peristalsis;Reduced epiglottic inversion;Reduced anterior laryngeal mobility;Reduced laryngeal  elevation;Reduced airway/laryngeal closure;Reduced tongue base retraction;Penetration/Aspiration before swallow;Penetration/Apiration after swallow;Pharyngeal residue - valleculae;Pharyngeal residue - pyriform;Pharyngeal residue - posterior pharnyx;Lateral channel residue  Pharyngeal- Thin Straw Delayed swallow initiation-pyriform sinuses;Reduced pharyngeal peristalsis;Reduced epiglottic inversion;Reduced anterior laryngeal mobility;Reduced laryngeal elevation;Reduced airway/laryngeal closure;Reduced tongue base retraction;Penetration/Aspiration before swallow;Penetration/Apiration after swallow;Pharyngeal residue - valleculae;Pharyngeal residue - pyriform;Pharyngeal residue - posterior pharnyx;Lateral channel residue  Pharyngeal- Puree Delayed swallow initiation-vallecula;Reduced pharyngeal peristalsis;Reduced epiglottic inversion;Reduced anterior laryngeal mobility;Reduced laryngeal elevation;Reduced airway/laryngeal closure;Reduced tongue base retraction;Penetration/Apiration after swallow;Pharyngeal residue - valleculae;Pharyngeal residue - pyriform;Pharyngeal residue - posterior pharnyx;Lateral channel residue  Pharyngeal- Mechanical Soft Delayed swallow initiation-vallecula;Reduced pharyngeal peristalsis;Reduced epiglottic inversion;Reduced anterior laryngeal mobility;Reduced laryngeal elevation;Reduced airway/laryngeal closure;Reduced tongue base retraction;Pharyngeal residue - valleculae;Pharyngeal residue - pyriform;Pharyngeal residue - posterior pharnyx;Lateral channel residue  Pharyngeal Comment MOD-SEVERE pharyngeal residue post initial swallow resulting in laryngeal penetration w/ intermittent aspiration noted BETWEEN trials. Pt was insensate to the penetration/aspiration occurring.        06/07/2022   11:00 AM  Cervical Esophageal Phase   Cervical Esophageal Phase Impaired          Orinda Kenner, MS, CCC-SLP Speech Language Pathologist Rehab Services; [place] [phone] (ascom) Saraiah Bhat 06/07/2022, 11:45 AM

## 2022-06-07 NOTE — Progress Notes (Signed)
Mobility Specialist - Progress Note   06/07/22 1500  Mobility  Activity Response Tolerated well  Distance Ambulated (ft) 0 ft  Assistive Device None  Range of Motion/Exercises All extremities     Pt lying in bed upon arrival, utilizing RA. Pt declined OOB activity this date despite heavy encouragement; pt worked with PT earlier. Participated in bed-level therex---voicing pain in "my entire L side" extending from UE to LE. Pt left in bed with alarm set, needs in reach.    Kathee Delton Mobility Specialist 06/07/22, 3:09 PM

## 2022-06-07 NOTE — Progress Notes (Signed)
Progress Note   Patient: Calvin [name] VWP:[id number] DOB: 1950-09-22 DOA: 05/28/2022     10 DOS: the patient was seen and examined on 06/07/2022   Brief hospital course:  JULIEN OSCAR is a 71 y.o. male with medical history significant of Ogilvie's syndrome, ileus, hypertension, hyperlipidemia, COPD, stroke with mild left-sided weakness, GERD, gout, depression with anxiety, seizure, CAD, MI,  alcohol abuse, CKD stage IIIa, dCHF, colon cancer, thyroid cancer, who presents from his facility with Nausea, vomiting, abdominal distention, abdominal pain for 2 days. Found to have new small bowel obstruction on CT abdomen with transition point in the right lower abdomen suspicious for adhesion. General surgery was consulted and NG tube was placed.  Patient also developed left-sided tingling and numbness and facial droop while in ED. CT head and MRI brain was negative for any acute intracranial abnormality and do reflect a chronic changes. 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  MRI of the cervical spine showed C3-C4 moderate to severe spinal stenosis, seen by neurosurgery, recommended outpatient follow-up.  9/25: Continued to have significant colonic distention.  NG suction continued.  9/26: Patient overnight complained about chest pain, on evaluation by night cross coverage, he was complaining more of generalized aches and pain but did responded to nitroglycerin x1.  EKG with sinus tachycardia, troponin 20>> 23. Patient was given a dose of morphine.  The patient was transferred to the stepdown unit for neostigmine dosing on 05/31/2022 and 06/01/2022.    06/02/2022 had colonoscopy to decompress air.  9/30 and 06/04/2022.  Patient had a severe dysphagia, is started on tube feeding.  10/3.  Colonoscopy decompression performed again.  Tube feedings out.  He refused a PICC line for TPN.  10/4.  Modified barium swallow showed a significant risk of aspiration.   Assessment and  Plan: Ogilvie's syndrome Gastroenterology gave neostigmine infusion on 05/31/2022 and on 06/01/22.  Decompressive colonoscopy procedure x2.  Currently patient has a rectal tube. Appreciate GI help.  Aspiration pneumonia ([place]) Respiratory distress Dysphagia with a high risk of aspiration. Patient no longer has any respite distress, completed antibiotics for aspiration pneumonia However, modified barium swallow showed very high risk of aspiration.  Currently, patient is NPO.  Discussed with general surgery, GI, patient currently is not a good candidate for for colon surgery, not a good candidate for PEG tube placement.  As result, he will have a PICC line for TPN.  We will determine later that if patient can have a PEG tube once the colon condition is better.  Paroxysmal atrial flutter ([place]) Patient currently still n.p.o., will hold anticoagulation for now.  Cervical spinal stenosis MRI showing multilevel cervical spondylosis most pronounced at C3-C4 where moderate to severe canal stenosis and severe bilateral foraminal stenosis.  Patient is not a great surgical candidate at this point.   Chronic diastolic CHF (congestive heart failure) ([facility]) -Last EF 50% No CHF exacerbation.   Seizures ([place]) Seizure -IV Keppra and Depakote.  HTN (hypertension) Currently n.p.o., continue IV metoprolol as needed.  CAD (coronary artery disease) Stable.  HLD (hyperlipidemia) Hold Zocor  Chronic kidney disease, stage 3a ([facility]) Stable.  COPD (chronic obstructive pulmonary disease) ([facility]) Continue nebulizers  Coffee ground emesis No recurrence.  Tobacco abuse -nicotine patch  Depression with anxiety Hold off blood pressure medicine as patient currently n.p.o.  Neuropathy Hold Neurontin as patient is n.p.o.       Subjective:  Patient complaining some sore throat from prior feeding tube.  Denies any short of breath  or cough.  Physical Exam: Vitals:   06/06/22 2059 06/07/22 0337  06/07/22 0500 06/07/22 0745  BP: (!) 152/94 119/71  118/66  Pulse: 88 78  74  Resp: '18 18  18  '$ Temp: 98.3 F (36.8 C) 98.1 F (36.7 C)  98.3 F (36.8 C)  TempSrc: Oral   Oral  SpO2: 91% 99%  100%  Weight:   95.8 kg   Height:       General exam: Appears calm and comfortable  Respiratory system: Decreased breath sounds. respiratory effort normal. Cardiovascular system: S1 & S2 heard, RRR. No JVD, murmurs, rubs, gallops or clicks. No pedal edema. Gastrointestinal system: Abdomen is nondistended, soft and nontender. No organomegaly or masses felt. Normal bowel sounds heard. Central nervous system: Alert and oriented x2. No focal neurological deficits. Extremities: Symmetric 5 x 5 power. Skin: No rashes, lesions or ulcers Psychiatry:  Mood & affect appropriate.   Data Reviewed:  Reviewed MRI results, all lab results.  Family Communication: Daughter updated.  Disposition: Status is: Inpatient Remains inpatient appropriate because: Severity of disease, IV treatment.  Planned Discharge Destination: Home with Home Health    Time spent: 55 minutes  Author: Sharen Hones, MD 06/07/2022 11:47 AM  For on call review [email or web address].

## 2022-06-07 NOTE — Consult Note (Signed)
Referring Physician:  No referring provider defined for this encounter.  Primary Physician:  Pcp, No  History of Present Illness: 06/07/2022 Calvin [name] is here today with a chief complaint of Ogilvie's syndrome.  He also has issues with mild left-sided weakness and trouble with walking.  He walks with a walker.  He was not clear about when he last walked.  He could not give complete history regarding loss of upper extremity function. He has been hospitalized with significant GI issues, and has other medical issues complicating his current presentation.  Calvin [name] has unclear symptoms of cervical myelopathy.  The symptoms are causing a significant impact on the patient's life.   Review of Systems:  A 10 point review of systems is negative, except for the pertinent positives and negatives detailed in the HPI.  Past Medical History: Past Medical History:  Diagnosis Date   Alcohol abuse    drinks on weekend   Anemia    Anxiety    Arthritis    Cancer ([place])    colon,throat   COPD (chronic obstructive pulmonary disease) (Calvin [name])    Coronary artery disease    Depression    Gout    Hypertension    Myocardial infarction [facility])    Neuromuscular disorder (Calvin [name])    Seizures ([place])    last 6 months ago   Stroke [facility])    multiple  left side weakness   Tremors of nervous system     Past Surgical History: Past Surgical History:  Procedure Laterality Date   CARPAL TUNNEL RELEASE Left 10/19/2015   Procedure: CARPAL TUNNEL RELEASE;  Surgeon: Hessie Knows, MD;  Location: [facility] ORS;  Service: Orthopedics;  Laterality: Left;   COLON SURGERY     COLONOSCOPY WITH PROPOFOL N/A 10/26/2021   Procedure: COLONOSCOPY WITH PROPOFOL;  Surgeon: Lesly Rubenstein, MD;  Location: [facility] ENDOSCOPY;  Service: Endoscopy;  Laterality: N/A;   COLONOSCOPY WITH PROPOFOL N/A 06/02/2022   Procedure: COLONOSCOPY WITH PROPOFOL;  Surgeon: Lesly Rubenstein, MD;  Location: [facility] ENDOSCOPY;  Service:  Endoscopy;  Laterality: N/A;   JOINT REPLACEMENT     left partial hip    THROAT SURGERY  2013   cancer    Allergies: Allergies as of 05/28/2022   (No Known Allergies)    Medications: Current Meds  Medication Sig   acetaminophen (TYLENOL) 325 MG tablet Take 650 mg by mouth every 6 (six) hours.   acidophilus (RISAQUAD) CAPS capsule Take 1 capsule by mouth daily.   ALPRAZolam (XANAX) 0.25 MG tablet Take 0.25 mg by mouth daily.   ALPRAZolam (XANAX) 0.5 MG tablet Take 0.5 mg by mouth 2 (two) times daily.   aspirin 81 MG chewable tablet Take 81 mg by mouth daily.   bisacodyl (DULCOLAX) 10 MG suppository Place 10 mg rectally in the morning.   bisacodyl (DULCOLAX) 5 MG EC tablet Take 5 mg by mouth daily.   Calcium Carbonate-Vit D-Min (CALCIUM 600+D PLUS MINERALS) 600-400 MG-UNIT TABS Take 1 tablet by mouth 2 (two) times daily.   furosemide (LASIX) 20 MG tablet Take 1 tablet (20 mg total) by mouth daily.   gabapentin (NEURONTIN) 100 MG capsule Take 100 mg by mouth in the morning. (Take only with morning '800mg'$  dose to equal '900mg'$  total)   gabapentin (NEURONTIN) 400 MG capsule Take 2 capsules (800 mg total) by mouth 3 (three) times daily.   guaiFENesin (MUCINEX) 600 MG 12 hr tablet Take 600 mg by mouth 2 (two) times daily.  HYDROcodone-acetaminophen (NORCO/VICODIN) 5-325 MG tablet Take 0.5 tablets by mouth every 12 (twelve) hours.   lactulose (CHRONULAC) 10 GM/15ML solution Take 20 g by mouth daily.   levETIRAcetam (KEPPRA) 100 MG/ML solution Take 1,000 mg by mouth 2 (two) times daily.   Lidocaine HCl 4 % CREA Apply 1 application topically daily. Apply from left side of face, down to left side of body to left foot   lisinopril (ZESTRIL) 5 MG tablet Take 1 tablet (5 mg total) by mouth daily.   melatonin 3 MG TABS tablet Take 6 mg by mouth at bedtime.   metoCLOPramide (REGLAN) 5 MG tablet Take 5 mg by mouth 4 (four) times daily.   metoprolol succinate (TOPROL-XL) 25 MG 24 hr tablet Take 25 mg  by mouth daily.   nystatin (MYCOSTATIN/NYSTOP) powder Apply 1 application topically 2 (two) times daily as needed (groin rash).   Omega-3 1000 MG CAPS Take 1,000 mg by mouth daily.   pantoprazole (PROTONIX) 20 MG tablet Take 20 mg by mouth in the morning.   polyethylene glycol (MIRALAX / GLYCOLAX) 17 g packet Take 34 g by mouth daily.   promethazine (PHENERGAN) 25 MG/ML injection Inject 25 mg into the vein every 4 (four) hours as needed for nausea or vomiting.   senna (SENOKOT) 8.6 MG TABS tablet Take 1 tablet (8.6 mg total) by mouth at bedtime. (Patient taking differently: Take 1 tablet by mouth every evening.)   sertraline (ZOLOFT) 50 MG tablet Take 50 mg by mouth daily.   simvastatin (ZOCOR) 10 MG tablet Take 10 mg by mouth at bedtime.    tamsulosin (FLOMAX) 0.4 MG CAPS capsule Take 0.4 mg by mouth every evening.   tiotropium (SPIRIVA) 18 MCG inhalation capsule Place 18 mcg into inhaler and inhale daily.   Wheat Dextrin (BENEFIBER DRINK MIX PO) Take 5 g by mouth daily.   Zinc Oxide 25 % PSTE Apply 1 Application topically 3 (three) times daily as needed (peri area).    Social History: Social History   Tobacco Use   Smoking status: Every Day   Smokeless tobacco: Never  Substance Use Topics   Alcohol use: Yes    Comment: weekends   Drug use: No    Family Medical History: Family History  Problem Relation Age of Onset   Cancer Mother    Hypertension Father    Leukemia Brother     Physical Examination: Vitals:   06/07/22 0337 06/07/22 0745  BP: 119/71 118/66  Pulse: 78 74  Resp: 18 18  Temp: 98.1 F (36.7 C) 98.3 F (36.8 C)  SpO2: 99% 100%    General: Patient is on tube feeding.  He is uncomfortable. Attention to examination is appropriate.  Neck:   Supple.  Full range of motion.  Respiratory: Patient is breathing without any difficulty.   NEUROLOGICAL:     Awake, alert, oriented to person, place, and time.  Speech is clear and fluent. Fund of knowledge is  appropriate.   Cranial Nerves: Pupils equal round and reactive to light.  Facial tone is symmetric.  Facial sensation is symmetric. Shoulder shrug is symmetric. Tongue protrusion is midline.   ROM of spine: full.    Strength: Side Biceps Triceps Deltoid Interossei Grip Wrist Ext. Wrist Flex.  R '5 5 5 5 5 5 5  '$ L '5 5 5 5 5 5 5    '$ Moves LE well.  Reflexes are 1+ and symmetric at the biceps, triceps, brachioradialis, patella and achilles.   Hoffman's is absent.  Bilateral upper and lower extremity sensation is intact to light touch.    No evidence of dysmetria noted.  Gait is untested.     Medical Decision Making  Imaging: MRI C spine 05/28/22 IMPRESSION: 1. Multilevel cervical spondylosis, most pronounced at the C3-4 level where there is moderate-to-severe canal stenosis and severe bilateral foraminal stenosis. Findings slightly progressed from prior MRI. 2. Moderate canal stenosis with severe left foraminal stenosis at C2-C3. 3. Chronically occluded left vertebral artery.     Electronically Signed   By: Davina Poke D.O.   On: 05/28/2022 17:29  I have personally reviewed the images and agree with the above interpretation.  Assessment and Plan: Mr. Musson is a pleasant 71 y.o. male with cervical stenosis with unclear symptoms of myelopathy.  He is currently medically unwell due to his other conditions.  - Will arrange outpatient follow up - No surgical intervention for now - OK to mobilize and work with PTOT   Thank you for involving me in the care of this patient.      Kohle Winner K. Izora Ribas MD, [facility] Neurosurgery

## 2022-06-07 NOTE — Progress Notes (Signed)
Physical Therapy Treatment Patient Details Name: Calvin [name] MRN: [id number] DOB: [birth year] Today's Date: 06/07/2022   History of Present Illness Pt is 39 YOM admitted for small bowel obstruction and abdominal distention. PMH includes: CVA w L sided weakness, dCHF, seizures, CAD, HLD, CKD3a, COPD, tobacco abuse, depression, anxiety, and ogilive's syndrome.    PT Comments    Pt with flat affect but pleasant and motivated to participate during the session and put forth good effort throughout.  Pt required grossly less physical assistance with functional mobility tasks this session and once in standing was able to take several small side steps towards the [facility] with only min A.  Pt reported no adverse symptoms during the session other than moderate abdominal pain that did not worsen with activity.  Pt will benefit from PT services in a SNF setting upon discharge to safely address deficits listed in patient problem list for decreased caregiver assistance and eventual return to PLOF.     Recommendations for follow up therapy are one component of a multi-disciplinary discharge planning process, led by the attending physician.  Recommendations may be updated based on patient status, additional functional criteria and insurance authorization.  Follow Up Recommendations  Skilled nursing-short term rehab (<3 hours/day) Can patient physically be transported by private vehicle: No   Assistance Recommended at Discharge Frequent or constant Supervision/Assistance  Patient can return home with the following Assistance with cooking/housework;Direct supervision/assist for financial management;A lot of help with walking and/or transfers;Assist for transportation;Direct supervision/assist for medications management;A lot of help with bathing/dressing/bathroom   Equipment Recommendations  None recommended by PT    Recommendations for Other Services       Precautions / Restrictions  Precautions Precautions: Fall;Other (comment) Precaution Comments: Seizure precautions Restrictions Weight Bearing Restrictions: No     Mobility  Bed Mobility Overal bed mobility: Needs Assistance Bed Mobility: Sit to Supine, Supine to Sit, Rolling Rolling: Mod assist   Supine to sit: Mod assist Sit to supine: +2 for physical assistance, Mod assist   General bed mobility comments: Pt required physical assist to manage BLE's and trunk but put forth good effort with cues for sequencing    Transfers Overall transfer level: Needs assistance Equipment used: Rolling walker (2 wheels) Transfers: Sit to/from Stand Sit to Stand: Mod assist, +2 physical assistance, From elevated surface, Min assist           General transfer comment: Min verbal cues for increased trunk flexion; min A to prevent posterior LOB initially but progressed to CGA; max standing time around 2 min    Ambulation/Gait Ambulation/Gait assistance: Min assist Gait Distance (Feet): 1 Feet Assistive device: Rolling walker (2 wheels) Gait Pattern/deviations: Trunk flexed Gait velocity: decreased     General Gait Details: Pt able to take several small steps at the EOB with min A and cues for sequencing   Stairs             Wheelchair Mobility    Modified Rankin (Stroke Patients Only)       Balance Overall balance assessment: Needs assistance Sitting-balance support: Bilateral upper extremity supported, Feet unsupported Sitting balance-Leahy Scale: Poor Sitting balance - Comments: Occasional min A to prevent LOB   Standing balance support: Bilateral upper extremity supported, During functional activity, Reliant on assistive device for balance Standing balance-Leahy Scale: Poor Standing balance comment: Min A to prevent posterior LOB  Cognition Arousal/Alertness: Awake/alert Behavior During Therapy: Flat affect Overall Cognitive Status: History of  cognitive impairments - at baseline                                          Exercises Total Joint Exercises Ankle Circles/Pumps: AROM, Strengthening, Both, 10 reps Quad Sets: Strengthening, Both, 10 reps Heel Slides: AAROM, Strengthening, Both, 10 reps Hip ABduction/ADduction: AAROM, Strengthening, Both, 10 reps Straight Leg Raises: AAROM, Strengthening, Both, 5 reps Other Exercises Other Exercises: Static sitting at the EOB x 5 min for postural control and improved activity tolerance    General Comments        Pertinent Vitals/Pain Pain Assessment Pain Assessment: 0-10 Pain Score: 5  Pain Location: abdominal pain Pain Descriptors / Indicators: Discomfort, Pressure Pain Intervention(s): Monitored during session, Premedicated before session, Repositioned    Home Living                          Prior Function            PT Goals (current goals can now be found in the care plan section) Progress towards PT goals: Progressing toward goals    Frequency    Min 2X/week      PT Plan Current plan remains appropriate    Co-evaluation              AM-PAC PT "6 Clicks" Mobility   Outcome Measure  Help needed turning from your back to your side while in a flat bed without using bedrails?: A Lot Help needed moving from lying on your back to sitting on the side of a flat bed without using bedrails?: A Lot Help needed moving to and from a bed to a chair (including a wheelchair)?: A Lot Help needed standing up from a chair using your arms (e.g., wheelchair or bedside chair)?: A Lot Help needed to walk in hospital room?: Total Help needed climbing 3-5 steps with a railing? : Total 6 Click Score: 10    End of Session Equipment Utilized During Treatment: Gait belt Activity Tolerance: Patient tolerated treatment well Patient left: in bed;with call bell/phone within reach;with bed alarm set Nurse Communication: Mobility status PT Visit  Diagnosis: Muscle weakness (generalized) (M62.81);Difficulty in walking, not elsewhere classified (R26.2)     Time: 2330-0762 PT Time Calculation (min) (ACUTE ONLY): 34 min  Charges:  $Therapeutic Exercise: 8-22 mins $Therapeutic Activity: 8-22 mins                     D. Scott Kadesia Robel PT, DPT 06/07/22, 11:53 AM

## 2022-06-07 NOTE — Progress Notes (Signed)
Nutrition Follow Up Note   DOCUMENTATION CODES:   Not applicable  INTERVENTION:   TPN per pharmacy- goal rate provides 105g/day protein and 2042kcal/day   Recommend thiamine 136m daily to TPN x 3 days   Pt at high refeed risk; recommend monitor potassium, magnesium and phosphorus labs daily until stable  Daily weights   NUTRITION DIAGNOSIS:   Inadequate oral intake related to acute illness as evidenced by other (comment) (pt on NPO/clear liquid diet since admission).  GOAL:   Patient will meet greater than or equal to 90% of their needs -not met   MONITOR:   Labs, Weight trends, TF tolerance, Skin, I & O's  ASSESSMENT:   71y.o. male with medical history significant of Ogilvie's syndrome, ileus, hypertension, hyperlipidemia, COPD, MI, stroke with mild left-sided weakness, GERD, gout, depression with anxiety, seizure, DDD, CAD, MI,  alcohol abuse, CKD stage IIIa, dCHF, colon cancer and thyroid cancer who is admitted with colonic ileus secondary to Ogilvie syndrome and possible SBO.  Pt s/p colonic decompression 9/29 & 10/3 with rectal tube placement   Pt tolerated trickle feeds overnight. NGT removed this morning for MBSS. SLP is recommending NPO for high aspiration risk. Pt is a poor candidate for G-tube placement. Palliative care following for GOC. Plan for now is for PICC line and [place]. Pt is at high refeed risk. Daily weights as pt at high risk for volume overload. Per chart, pt appears weight stable since admission.   Medications reviewed and include: ciprofloxacin, insulin, MVI, nicotine, protonix, miralax  Labs reviewed: K 4.2 wnl, P 3.7 wnl, Mg 2.0 wnl Vitamin A- 19.4(L), vitamin E 7.3(L), B12 993(H)- 9/26  Diet Order:   Diet Order             Diet NPO time specified  Diet effective now                  EDUCATION NEEDS:   No education needs have been identified at this time  Skin:  Skin Assessment: Reviewed RN Assessment  Last  BM:  9/29  Height:   Ht Readings from Last 1 Encounters:  06/02/22 '5\' 11"'  (1.803 m)    Weight:   Wt Readings from Last 1 Encounters:  06/07/22 95.8 kg    Ideal Body Weight:  78 kg  BMI:  Body mass index is 29.46 kg/m.  Estimated Nutritional Needs:   Kcal:  1900-2200kcal/day  Protein:  95-110g/day  Fluid:  1.9-2.2L/day  CKoleen DistanceMS, RD, LDN Please [facility] and/or RD on-call/weekend/after hours pager

## 2022-06-07 NOTE — Consult Note (Signed)
PHARMACY - TOTAL PARENTERAL NUTRITION CONSULT NOTE   Indication:  Intolerance to enteral feeding  Patient Measurements: Height: '5\' 11"'$  (180.3 cm) Weight: 95.8 kg (211 lb 3.2 oz) IBW/kg (Calculated) : 75.3 TPN AdjBW (KG): 80.5 Body mass index is 29.46 kg/m.  Assessment:  Patient is a 71 y/o M with medical history including Ogilvie's syndrome, HTN, HLD, COPD, CVA, GERD, gout, depression / anxiety, seizures, CAD, EtOH use disorder, CKD, diastolic CHF, colon cancer, thyroid cancer who is admitted with resolved SBO but now with worsening pseudoobstruction. GI consulted. Patient failed to respond to conservative measures with methylnaltrexone and neostigmine x 2. Patient underwent decompressive colonoscopy on 1/19 complicated by colonic distension post-procedurally. Patient underwent repeat decompressive colonoscopy on 10/3. Patient had been on tube feeds at trickle rate. Pharmacy consulted to initiate TPN to help meet nutritional goals given acute abdominal issues and intolerance to enteral nutrition.  Glucose / Insulin: Pre-diabetic range Hgb A1c (5.8% on 05/28/22). Patient has been overall normoglycemic this admission. Electrolytes: Within normal limits. Patient is at high re-feeding risk Renal: Stable CKD, Scr ~ 1 Hepatic: Within normal limits Intake / Output; MIVF: +5.2L for the admission. No MIVF running GI Imaging: 9/24 CT abdomen / pelvis: SBO and chronic colonic ileus or pseudo-obstruction 9/26 Abdominal X-ray: Air-filled distention of large and small bowel loops favoring ileus 9/27 CT abdomen / pelvis: No evidence of SBO 9/29 Abdominal X-ray: Severe dilatation of the colon as can be seen with pseudo-obstruction/colonic ileus 9/30 Abdominal X-ray: Significant interval improvement in colonic distension, which has mostly resolved 10/1 Abdominal X-ray: Persistent gaseous dilation of the colon, though mildly improved compared to 06/02/2022 10/2 Abdominal X-ray: Worsened gaseous distention  of the colon GI Surgeries / Procedures:  9/29: Decompressive colonoscopy 10/3: Decompressive colonoscopy  Central access: DL PICC placed 10/4 TPN start date: Pending 10/4  Nutritional Goals: Goal TPN rate is 80 mL/hr (provides 105 g of protein and 2042 kcals per day)  RD Assessment: Estimated Needs Total Energy Estimated Needs: 1900-2200kcal/day Total Protein Estimated Needs: 95-110g/day Total Fluid Estimated Needs: 1.9-2.2L/day  Current Nutrition:  NPO  Plan:  --Start TPN at 40 mL/hr (1/2 rate) at 1800 on 10/4 Goal rate TPN will provide: Protein: 105 g (55 g/L) Dextrose: 307 g (16%) Lipids: 57.6 g (30 g/L) Kcals / day: 2042 Fluids: 1.92 L --Electrolytes in TPN: Na 2mq/L, K 568m/L, Ca 57m4mL, Mg 57mE40m, and Phos 157mm60m. Cl:Ac 1:1 --Add standard MVI and trace elements to TPN --Initiate Moderate q6h SSI and adjust as needed  --Monitor TPN labs on Mon/Thurs, daily until stable  JustiAlison Murray/2023,1:42 PM

## 2022-06-07 NOTE — Progress Notes (Signed)
[facility] SURGICAL PROGRESS NOTE (cpt 9923/2)  Hospital Day(s): 10.   Post op day(s): 1 Day Post-Op.   Interval History: Patient seen and examined, no acute events or new complaints overnight. Patient remains a very unreliable historian. He did undergo repeat decompressive colonoscopy and decompression tube placement yesterday (10/03). This morning, his abdomen is markedly improved, less distended, non-tender. He continues to have stool output from rectal tube. No longer with NGT.   Review of Systems:  Unable to reliably preform this morning secondary confusion, poor historian, no family at bedside to assist  Vital signs in last 24 hours: [min-max] current  Temp:  [97.2 F (36.2 C)-98.3 F (36.8 C)] 98.1 F (36.7 C) (10/04 0337) Pulse Rate:  [74-88] 78 (10/04 0337) Resp:  [14-18] 18 (10/04 0337) BP: (119-165)/(71-94) 119/71 (10/04 0337) SpO2:  [91 %-100 %] 99 % (10/04 0337) Weight:  [95.8 kg] 95.8 kg (10/04 0500)     Height: '5\' 11"'$  (180.3 cm) Weight: 95.8 kg BMI (Calculated): 29.47   Intake/Output last 2 shifts:  10/03 0701 - 10/04 0700 In: 150 [I.V.:150] Out: 1200 [Urine:750]   Physical Exam:  Constitutional: alert, cooperative and no distress  HENT: normocephalic without obvious abnormality  Eyes: PERRL, EOM's grossly intact and symmetric  Respiratory: breathing non-labored at rest  Cardiovascular: regular rate and sinus rhythm  Gastrointestinal: soft, non-tender, previous distension is resolved; marked improvement in this. He is certainly without peritonitis    Labs:     Latest Ref Rng & Units 06/06/2022    4:33 AM 06/05/2022    6:13 AM 06/04/2022    4:44 AM  CBC  WBC 4.0 - 10.5 K/uL   6.5   Hemoglobin 13.0 - 17.0 g/dL 11.8  11.7  12.3   Hematocrit 39.0 - 52.0 %   40.2   Platelets 150 - 400 K/uL   257       Latest Ref Rng & Units 06/06/2022    4:33 AM 06/05/2022    6:13 AM 06/04/2022    4:44 AM  CMP  Glucose 70 - 99 mg/dL 96  94  116   BUN 8 -  23 mg/dL '14  14  19   '$ Creatinine 0.61 - 1.24 mg/dL 1.09  0.97  1.20   Sodium 135 - 145 mmol/L 142  144  147   Potassium 3.5 - 5.1 mmol/L 3.5  3.7  3.8   Chloride 98 - 111 mmol/L 111  111  113   CO2 22 - 32 mmol/L '25  26  23   '$ Calcium 8.9 - 10.3 mg/dL 8.2  8.4  8.5      Imaging studies: No new pertinent imaging studies   Assessment/Plan: (ICD-10's: K75.609) 71 y.o. male with radiographically resolved small bowel obstruction still with chronic colonic distension s/p secondary decompressive colonoscopy no 78/67, complicated by significant comorbid disease, physical deconditioning, overall poor functional status.               - Greatly appreciate all effort made by the gastroenterology service to aid this gentlemen. Seems that he has made significant improvements this morning. For now, will continue decompressive tube as long as patient tolerates. Unfortunately, we have exhausted all conservative measures in this case. It seems that consideration of surgical resection (ie: sub total colectomy) would likely be next potential option although he remains without true mechanical obstruction, perforation, etc and this process seems physiologic as a result of some combination of his comorbid conditions, previous narcotic use, and overall poor  functional status. To say the least, he is a very sub-optimal candidate for any surgical interventions and this would carry a much higher morbidity rate. Palliative care is involve which I feel is appropriate, and we need to set realistic goals and expectations as well as determine how aggressive family would desire to be in this case.    - Pending swallow study this morning  - Would be very hesitant to re-insert NGT in this case if possible            - Monitor abdominal examination; on-going bowel function - Consider serial KUBs prn            - Pain control prn; limit narcotics as much as feasible             - Further management per primary service; we will  remain available    All of the above findings and recommendations were discussed with the medical team  -- Edison Simon, PA-C [facility] 06/07/2022, 7:17 AM M-F: 7am - 4pm

## 2022-06-08 DIAGNOSIS — K5981 Ogilvie syndrome: Secondary | ICD-10-CM | POA: Diagnosis not present

## 2022-06-08 DIAGNOSIS — K56609 Unspecified intestinal obstruction, unspecified as to partial versus complete obstruction: Secondary | ICD-10-CM | POA: Diagnosis not present

## 2022-06-08 DIAGNOSIS — Z515 Encounter for palliative care: Secondary | ICD-10-CM | POA: Diagnosis not present

## 2022-06-08 DIAGNOSIS — I4892 Unspecified atrial flutter: Secondary | ICD-10-CM | POA: Diagnosis not present

## 2022-06-08 DIAGNOSIS — J690 Pneumonitis due to inhalation of food and vomit: Secondary | ICD-10-CM | POA: Diagnosis not present

## 2022-06-08 LAB — GLUCOSE, CAPILLARY
Glucose-Capillary: 119 mg/dL — ABNORMAL HIGH (ref 70–99)
Glucose-Capillary: 120 mg/dL — ABNORMAL HIGH (ref 70–99)
Glucose-Capillary: 114 mg/dL — ABNORMAL HIGH (ref 70–99)
Glucose-Capillary: 124 mg/dL — ABNORMAL HIGH (ref 70–99)
Glucose-Capillary: 126 mg/dL — ABNORMAL HIGH (ref 70–99)

## 2022-06-08 LAB — CBC
HCT: 39.9 % (ref 39.0–52.0)
Hemoglobin: 12.6 g/dL — ABNORMAL LOW (ref 13.0–17.0)
MCH: 30.2 pg (ref 26.0–34.0)
MCHC: 31.6 g/dL (ref 30.0–36.0)
MCV: 95.7 fL (ref 80.0–100.0)
Platelets: 247 10*3/uL (ref 150–400)
RBC: 4.17 MIL/uL — ABNORMAL LOW (ref 4.22–5.81)
RDW: 12.4 % (ref 11.5–15.5)
WBC: 6.8 10*3/uL (ref 4.0–10.5)
nRBC: 0 % (ref 0.0–0.2)

## 2022-06-08 LAB — COMPREHENSIVE METABOLIC PANEL
ALT: 32 U/L (ref 0–44)
AST: 24 U/L (ref 15–41)
Albumin: 3.3 g/dL — ABNORMAL LOW (ref 3.5–5.0)
Alkaline Phosphatase: 39 U/L (ref 38–126)
Anion gap: 6 (ref 5–15)
BUN: 16 mg/dL (ref 8–23)
CO2: 24 mmol/L (ref 22–32)
Calcium: 8.7 mg/dL — ABNORMAL LOW (ref 8.9–10.3)
Chloride: 108 mmol/L (ref 98–111)
Creatinine, Ser: 1.01 mg/dL (ref 0.61–1.24)
GFR, Estimated: >60 mL/min (ref >60)
Glucose, Bld: 114 mg/dL — ABNORMAL HIGH (ref 70–99)
Potassium: 4.1 mmol/L (ref 3.5–5.1)
Sodium: 138 mmol/L (ref 135–145)
Total Bilirubin: 0.5 mg/dL (ref 0.3–1.2)
Total Protein: 6.5 g/dL (ref 6.5–8.1)

## 2022-06-08 LAB — MAGNESIUM: Magnesium: 1.9 mg/dL (ref 1.7–2.4)

## 2022-06-08 LAB — TRIGLYCERIDES: Triglycerides: 159 mg/dL — ABNORMAL HIGH (ref <150)

## 2022-06-08 LAB — PHOSPHORUS: Phosphorus: 3.5 mg/dL (ref 2.5–4.6)

## 2022-06-08 MED ORDER — TRACE MINERALS CU-MN-SE-ZN 300-55-60-3000 MCG/ML IV SOLN
INTRAVENOUS | Status: AC
  Filled 2022-06-08: qty 672

## 2022-06-08 MED ORDER — ACETAMINOPHEN 10 MG/ML IV SOLN
1000.0000 mg | Freq: Four times a day (QID) | INTRAVENOUS | Status: AC | PRN
  Administered 2022-06-08 – 2022-06-09 (×2): 1000 mg via INTRAVENOUS
  Filled 2022-06-08 (×2): qty 100

## 2022-06-08 MED ORDER — PANTOPRAZOLE SODIUM 40 MG IV SOLR
40.0000 mg | INTRAVENOUS | Status: DC
  Administered 2022-06-09 – 2022-06-12 (×4): 40 mg via INTRAVENOUS
  Filled 2022-06-08 (×4): qty 10

## 2022-06-08 MED ORDER — ENOXAPARIN SODIUM 100 MG/ML IJ SOSY
1.0000 mg/kg | PREFILLED_SYRINGE | Freq: Two times a day (BID) | INTRAMUSCULAR | Status: DC
  Administered 2022-06-08 – 2022-06-12 (×9): 97.5 mg via SUBCUTANEOUS
  Filled 2022-06-08 (×9): qty 0.97

## 2022-06-08 NOTE — Progress Notes (Signed)
Palliative: Calvin [name]  is lying quietly in bed.  He is sleeping but wakes easily when I call his name.  He tells me that he slept well last night.  I believe he can make his basic needs known.  There is now family at bedside at this time. We talk about his issues with his abdomen/gut.  He tells me that he would be agreeable to any surgical intervention as needed.  He agrees for me to speak with his daughter.      Call to daughter, Calvin [name].  We talk about his acute and chronic illness, his colon dysfunction. States he may not be candidate.  States he has had this bowel in past and this time is worse.  States he was self toileting and maybe the facility wasn't aware of bowel dysfunction.  Calvin [name] asks about transfer to another hospital if needed, we talk about guidelines.   We discuss Code status.  At this point, Calvin [name] shares that they both want FULL SCOPE/code.  We talk about how to make choices over the next few months, things may be different in a few months weeks/months.  Conference with attending, bedside nursing staff, transition of care team related to patient condition, needs, goals of care, disposition.  Plan:  At this point, continue to treat the treatable, full code/scope.  Time for outcomes.  Agreeable to surgery if warranted.  Transition from [facility] to [facility].  78 minutes  [name], NP Palliative medicine team Team phone [phone] Greater than 50% of this time was spent counseling and coordinating care related to the above assessment and plan.

## 2022-06-08 NOTE — Progress Notes (Addendum)
[facility] SURGICAL PROGRESS NOTE (cpt [phone])  Hospital Day(s): 11.   Interval History: Patient seen and examined, no acute events or new complaints overnight. Patient remains a very unreliable historian. Abdomen remains markedly improved after decompressive colonoscopy on 10/03. Labs are reassuring. He continues to have stool output from rectal tube. Started on TPN yesterday (10/04)  Review of Systems:  Unable to reliably preform this morning secondary confusion, poor historian, no family at bedside to assist  Vital signs in last 24 hours: [min-max] current  Temp:  [98.4 F (36.9 C)-98.8 F (37.1 C)] 98.4 F (36.9 C) (10/05 0728) Pulse Rate:  [67-82] 79 (10/05 0728) Resp:  [14-18] 14 (10/05 0728) BP: (114-150)/(73-124) 114/73 (10/05 0728) SpO2:  [91 %-100 %] 100 % (10/05 0831) Weight:  [97.1 kg] 97.1 kg (10/05 0300)     Height: '5\' 11"'$  (180.3 cm) Weight: 97.1 kg BMI (Calculated): 29.87   Intake/Output last 2 shifts:  10/04 0701 - 10/05 0700 In: 532.1 [I.V.:322.1; IV Piggyback:150] Out: 2200 [Urine:2200]   Physical Exam:  Constitutional: alert, cooperative and no distress  HENT: normocephalic without obvious abnormality  Eyes: PERRL, EOM's grossly intact and symmetric  Respiratory: breathing non-labored at rest; undergoing breathing treatment Cardiovascular: regular rate and sinus rhythm  Gastrointestinal: soft, non-tender, previous distension is resolved; marked improvement in this. He is certainly without peritonitis. Rectal tube in place    Labs:     Latest Ref Rng & Units 06/08/2022    4:55 AM 06/06/2022    4:33 AM 06/05/2022    6:13 AM  CBC  WBC 4.0 - 10.5 K/uL 6.8     Hemoglobin 13.0 - 17.0 g/dL 12.6  11.8  11.7   Hematocrit 39.0 - 52.0 % 39.9     Platelets 150 - 400 K/uL 247         Latest Ref Rng & Units 06/08/2022    4:55 AM 06/07/2022    7:51 AM 06/06/2022    4:33 AM  CMP  Glucose 70 - 99 mg/dL 114  113  96   BUN 8 - 23 mg/dL '16  13  14    '$ Creatinine 0.61 - 1.24 mg/dL 1.01  1.03  1.09   Sodium 135 - 145 mmol/L 138  138  142   Potassium 3.5 - 5.1 mmol/L 4.1  4.2  3.5   Chloride 98 - 111 mmol/L 108  106  111   CO2 22 - 32 mmol/L '24  28  25   '$ Calcium 8.9 - 10.3 mg/dL 8.7  8.6  8.2   Total Protein 6.5 - 8.1 g/dL 6.5     Total Bilirubin 0.3 - 1.2 mg/dL 0.5     Alkaline Phos 38 - 126 U/L 39     AST 15 - 41 U/L 24     ALT 0 - 44 U/L 32        Imaging studies: No new pertinent imaging studies   Assessment/Plan: (ICD-10's: K78.609) 71 y.o. male with radiographically resolved small bowel obstruction still with chronic colonic distension s/p secondary decompressive colonoscopy no 87/86, complicated by significant comorbid disease, physical deconditioning, overall poor functional status.               - Greatly appreciate all effort made by the gastroenterology service to aid this gentlemen. Seems that he has made significant improvements this morning. For now, will continue decompressive tube as long as patient tolerates. Unfortunately, we have exhausted all conservative measures in this case. It seems that consideration  of surgical resection (ie: sub total colectomy) would likely be next potential option although he remains without true mechanical obstruction, perforation, etc and this process seems physiologic as a result of some combination of his comorbid conditions, previous narcotic use, and overall poor functional status. To say the least, he is a very sub-optimal candidate for any surgical interventions and this would carry a much higher morbidity rate. Palliative care is involved which I feel is appropriate, and we need to set realistic goals and expectations as well as determine how aggressive family would desire to be in this case.    - Continue PICC + TPN for now. Discussion on-going regarding potential need for PEG. Of course endoscopic placement with IR or GI would be least invasive measure; however, I suspect with degree of  colonic distension this may not be feasible. Surgical placement is an option but this would need to be an open procedure for similar reasons and would again carry significant risk and morbidity for questionable benefit. Will continue to monitor haw he does with parental nutrition in the short term; reassess              - Monitor abdominal examination; on-going bowel function - Consider serial KUBs prn            - Pain control prn; limit narcotics as much as feasible             - Further management per primary service; we will remain available    All of the above findings and recommendations were discussed with the medical team  -- Edison Simon, PA-C [facility] 06/08/2022, 8:38 AM M-F: 7am - 4pm

## 2022-06-08 NOTE — Progress Notes (Signed)
GI Inpatient Follow-up Note  Subjective:  Patient seen and stable with no acute events overnight. Brother is at the bedside.  Scheduled Inpatient Medications:   budesonide (PULMICORT) nebulizer solution  0.5 mg Nebulization BID   Chlorhexidine Gluconate Cloth  6 each Topical Daily   ciprofloxacin  1 drop Both Eyes Q4H while awake   enoxaparin (LOVENOX) injection  1 mg/kg Subcutaneous BID   insulin aspart  0-9 Units Subcutaneous Q6H   ipratropium-albuterol  3 mL Nebulization BID   lidocaine HCl (PF)  10 mL Other Once   LORazepam  1 mg Intravenous QHS   multivitamin  15 mL Per Tube Daily   nicotine  21 mg Transdermal Daily   pantoprazole (PROTONIX) IV  40 mg Intravenous Q12H   polyethylene glycol  17 g Per Tube BID   sodium chloride flush  10-40 mL Intracatheter A35T   trolamine salicylate   Topical BID    Continuous Inpatient Infusions:    acetaminophen 1,000 mg (06/08/22 1328)   levETIRAcetam 1,000 mg (06/08/22 0922)   TPN ADULT (ION) 40 mL/hr at 06/08/22 0231   TPN ADULT (ION)     valproate sodium 250 mg (06/08/22 1208)    PRN Inpatient Medications:  acetaminophen, albuterol, bisacodyl, hydrALAZINE, lidocaine, LORazepam, LORazepam, metoprolol tartrate, nitroGLYCERIN, ondansetron (ZOFRAN) IV, phenol, polyvinyl alcohol, senna-docusate, sodium chloride flush, trolamine salicylate  Review of Systems:  Review of Systems  Constitutional:  Negative for chills and fever.  Respiratory:  Negative for shortness of breath.   Cardiovascular:  Negative for chest pain.  Gastrointestinal:  Negative for blood in stool and constipation.  Musculoskeletal:  Positive for joint pain.  Skin:  Negative for rash.  Neurological:  Negative for focal weakness.  Psychiatric/Behavioral:  Negative for substance abuse.       Physical Examination: BP 114/73 (BP Location: Left Arm)   Pulse 79   Temp 98.4 F (36.9 C) (Oral)   Resp 14   Ht '5\' 11"'$  (1.803 m)   Wt 97.1 kg   SpO2 100%   BMI 29.86  kg/m  Gen: NAD, flat affect HEENT: PEERLA, EOMI, Neck: supple, no JVD or thyromegaly Chest: No respiratory distress Abd: soft, non-tender, non-distended Ext: no edema, well perfused with 2+ pulses, Skin: no rash or lesions noted Lymph: no LAD  Data: Lab Results  Component Value Date   WBC 6.8 06/08/2022   HGB 12.6 (L) 06/08/2022   HCT 39.9 06/08/2022   MCV 95.7 06/08/2022   PLT 247 06/08/2022   Recent Labs  Lab 06/05/22 0613 06/06/22 0433 06/08/22 0455  HGB 11.7* 11.8* 12.6*   Lab Results  Component Value Date   NA 138 06/08/2022   K 4.1 06/08/2022   CL 108 06/08/2022   CO2 24 06/08/2022   BUN 16 06/08/2022   CREATININE 1.01 06/08/2022   Lab Results  Component Value Date   ALT 32 06/08/2022   AST 24 06/08/2022   ALKPHOS 39 06/08/2022   BILITOT 0.5 06/08/2022   No results for input(s): "APTT", "INR", "PTT" in the last 168 hours. Assessment/Plan: Mr. Simmering is a 71 y.o. gentleman with history of chronic ogilvie's syndrome, COPD, hypertension, hyperlipidemia, and multiple other medical comorbidities here with resolved SBO and now worsening pseudoobstruction we have been consulted for ogilvie's syndrome. Underwent decompressive colonoscopy on 9/29 but not surprisingly, colonic distension has recurred which can happen 40% of the time after decompressive colonoscopy. Underwent repeat decompressive colonoscopy on 10/3 with decompression tube left in place  Recommendations:  - continue decompression  tube to low-intermittent suction but it needs to be flushed with normal saline every 4-6 hours. - will consider pulling tube when passing more flatus/stool from below - continue TPN - avoid all narcotics - replete electrolytes aggressively - can give miralax through the decompression tube - aggressive PT/OT, out of bed to the chair   Please call with any questions or concerns   Raylene Miyamoto MD, MPH [place]

## 2022-06-08 NOTE — Evaluation (Signed)
Occupational Therapy Evaluation Patient Details Name: Calvin [name] MRN: [id number] DOB: 1951-05-03 Today's Date: 06/08/2022   History of Present Illness Pt is 82 YOM admitted for small bowel obstruction and abdominal distention. PMH includes: CVA w L sided weakness, dCHF, seizures, CAD, HLD, CKD3a, COPD, tobacco abuse, depression, anxiety, and ogilive's syndrome.   Clinical Impression   Pt seen for re-evaluation this date after going under general anesthesia previous date. Pt long sitting in bed, R lateral lean which he declines assist to reposition 2/2 L side pain. Pt reporting sharp/shooting pins and needles sensation in L hand (particularly in fingertips) and L foot. Noted decreased AROM in L wrist particularly with wrist flexion. Pt declined positioning assist for improved neutral joint alignment. Pt noted complaint of something in his throat. HOB elevated and pt able to cough up large opaque looking phlegm into cup. RN notified. Pt with notable improvement in vocal clarity afterwards. HOB stayed elevated and cup and kleenex provided in case he needs to cough up more phlegm. Pt completed grooming tasks with setup including washing his face, hands, and oral care using oral swap. Pt endorsing too much L side pain for additional ADL/mobility at this time. OT goals reviewed and remain appropriate. Pt will continue to benefit from skilled OT services to maximize return to PLOF and minimize risk of further functional decline and caregiver burden. Recommend SNF at discharge.       Recommendations for follow up therapy are one component of a multi-disciplinary discharge planning process, led by the attending physician.  Recommendations may be updated based on patient status, additional functional criteria and insurance authorization.   Follow Up Recommendations  Skilled nursing-short term rehab (<3 hours/day)    Assistance Recommended at Discharge Frequent or constant Supervision/Assistance  Patient  can return home with the following Two people to help with walking and/or transfers;A lot of help with bathing/dressing/bathroom;Assistance with cooking/housework;[name];Assist for transportation;Direct supervision/assist for medications management    Functional Status Assessment  Patient has had a recent decline in their functional status and/or demonstrates limited ability to make significant improvements in function in a reasonable and predictable amount of time  Equipment Recommendations  Other (comment) (defer to next venue)    Recommendations for Other Services       Precautions / Restrictions Precautions Precautions: Fall;Other (comment) Precaution Comments: Seizure precautions Restrictions Weight Bearing Restrictions: No      Mobility Bed Mobility               General bed mobility comments: deferred 2/2 pain    Transfers                          Balance       Sitting balance - Comments: long sitting in bed, pt tends to lean towards R side 2/2 L side pain, won't allow for adjustment Postural control: Right lateral lean                                 ADL either performed or assessed with clinical judgement   ADL Overall ADL's : Needs assistance/impaired     Grooming: Wash/dry face;Wash/dry hands;Oral care;Bed level;Set up;Supervision/safety Grooming Details (indicate cue type and reason): long sitting in bed, supported, set up for tasks  Vision         Perception     Praxis      Pertinent Vitals/Pain Pain Assessment Pain Assessment: Faces Pain Score: 8  Pain Location: L side, particularly finger tips and foot on L side - feels like pins and needles, sharp shooting Pain Descriptors / Indicators: Pins and needles, Sharp, Shooting Pain Intervention(s): Limited activity within patient's tolerance, Monitored during session, Premedicated before  session, Repositioned     Hand Dominance Right   Extremity/Trunk Assessment Upper Extremity Assessment Upper Extremity Assessment: Generalized weakness;LUE deficits/detail LUE Deficits / Details: hx of CVA however reports weaker grip than baseline, endorses pins and needles in L hand, worse in fingertips LUE Sensation: decreased light touch LUE Coordination: decreased fine motor;decreased gross motor   Lower Extremity Assessment Lower Extremity Assessment: Generalized weakness;LLE deficits/detail LLE Deficits / Details: L sided weakness due to prior stroke, pins and needles in L foot LLE Sensation: decreased light touch       Communication Communication Communication: HOH   Cognition Arousal/Alertness: Awake/alert Behavior During Therapy: Flat affect Overall Cognitive Status: History of cognitive impairments - at baseline                                 General Comments: appropriate, follows simple commands     General Comments       Exercises Other Exercises Other Exercises: Gentle AROM to L hand with noted tone in L wrist particularly with wrist flexion   Shoulder Instructions      Home Living Family/patient expects to be discharged to:: Other (Comment) ([facility])                             Home Equipment: Wheelchair - Publishing copy (2 wheels)          Prior Functioning/Environment Prior Level of Function : Needs assist             Mobility Comments: son in room reports pt using RW or w/c for mobility at facility          OT Problem List: Decreased strength;Decreased cognition;Pain;Impaired balance (sitting and/or standing);Decreased safety awareness;Decreased activity tolerance;Decreased knowledge of use of DME or AE;Decreased coordination;Impaired UE functional use      OT Treatment/Interventions: Self-care/ADL training;Therapeutic exercise;Patient/family education;Energy conservation;Therapeutic activities;DME  and/or AE instruction;Cognitive remediation/compensation;Balance training    OT Goals(Current goals can be found in the care plan section) Acute Rehab OT Goals Patient Stated Goal: have less pain, eat OT Goal Formulation: With patient Time For Goal Achievement: 06/22/22 Potential to Achieve Goals: Fair  OT Frequency: Min 2X/week    Co-evaluation              AM-PAC OT "6 Clicks" Daily Activity     Outcome Measure Help from another person eating meals?: Total (NPO) Help from another person taking care of personal grooming?: A Little Help from another person toileting, which includes using toliet, bedpan, or urinal?: A Lot Help from another person bathing (including washing, rinsing, drying)?: A Lot Help from another person to put on and taking off regular upper body clothing?: A Lot Help from another person to put on and taking off regular lower body clothing?: A Lot 6 Click Score: 12   End of Session Nurse Communication: Mobility status;Other (comment) (coughed up large amount of opaque phlegm)  Activity Tolerance: Patient limited by pain Patient left: in bed;with  call bell/phone within reach;with bed alarm set  OT Visit Diagnosis: Muscle weakness (generalized) (M62.81);Unsteadiness on feet (R26.81)                Time: 7017-7939 OT Time Calculation (min): 19 min Charges:  OT General Charges $OT Visit: 1 Visit OT Evaluation $OT Re-eval: 1 Re-eval OT Treatments $Self Care/Home Management : 8-22 mins  Ardeth Perfect., MPH, MS, OTR/L ascom [phone] 06/08/22, 3:36 PM

## 2022-06-08 NOTE — Progress Notes (Signed)
Progress Note   Patient: Calvin [name]:[id number] DOB: [birth year] DOA: 05/28/2022     11 DOS: the patient was seen and examined on 06/08/2022   Brief hospital course:  Calvin [name] is a 71 y.o. male with medical history significant of Ogilvie's syndrome, ileus, hypertension, hyperlipidemia, COPD, stroke with mild left-sided weakness, GERD, gout, depression with anxiety, seizure, CAD, MI,  alcohol abuse, CKD stage IIIa, dCHF, colon cancer, thyroid cancer, who presents from his facility with Nausea, vomiting, abdominal distention, abdominal pain for 2 days. Found to have new small bowel obstruction on CT abdomen with transition point in the right lower abdomen suspicious for adhesion. General surgery was consulted and NG tube was placed.  Patient also developed left-sided tingling and numbness and facial droop while in ED. CT head and MRI brain was negative for any acute intracranial abnormality and do reflect a chronic changes. 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  MRI of the cervical spine showed C3-C4 moderate to severe spinal stenosis, seen by neurosurgery, recommended outpatient follow-up.  9/25: Continued to have significant colonic distention.  NG suction continued. The patient was transferred to the stepdown unit for neostigmine dosing on 05/31/2022 and 06/01/2022.   Patient had a colonoscopy decompression on 9/29 and 10/3, followed with rectal tube. Tube feeding started on 10/30-10/3.  10/4.  Modified barium swallow showed a significant risk of aspiration.  TPN started after PICC line placed.  Assessment and Plan: Ogilvie's syndrome Gastroenterology gave neostigmine infusion on 05/31/2022 and on 06/01/22.  Decompressive colonoscopy procedure x2.  Currently patient has a rectal tube. Abdominal distention seems to be improving.   Aspiration pneumonia ([place]) Respiratory distress Dysphagia with a high risk of aspiration. Due to high risk of aspiration, patient is a  placed on n.p.o. Discussed with general surgery and GI, patient is not ready for tube feeding due to significant abdominal distention.  Decisions made for TPN, PICC line has been placed.  Continue to follow electrolytes.   Paroxysmal atrial flutter (HCC) Change anticoagulation to Lovenox.   Cervical spinal stenosis MRI showing multilevel cervical spondylosis most pronounced at C3-C4 where moderate to severe canal stenosis and severe bilateral foraminal stenosis.  Patient is not a great surgical candidate at this point.    Chronic diastolic CHF (congestive heart failure) (HCC) -Last EF 50% No CHF exacerbation.     Seizures ([place]) Seizure -IV Keppra and Depakote.   HTN (hypertension) Currently n.p.o., continue IV metoprolol as needed.   CAD (coronary artery disease) Stable.   HLD (hyperlipidemia) Hold Zocor   Chronic kidney disease, stage 3a (HCC) Stable.   COPD (chronic obstructive pulmonary disease) (HCC) Continue nebulizers   Coffee ground emesis No recurrence.   Tobacco abuse -nicotine patch   Depression with anxiety Hold off blood pressure medicine as patient currently n.p.o.   Neuropathy Hold Neurontin as patient is n.p.o.      Subjective:  Patient better today, no abdominal pain.  No vomiting.  Still has loose stool from rectal tube.  Physical Exam: Vitals:   06/08/22 0300 06/08/22 0343 06/08/22 0728 06/08/22 0831  BP:  118/76 114/73   Pulse:  82 79   Resp:  18 14   Temp:  98.6 F (37 C) 98.4 F (36.9 C)   TempSrc:   Oral   SpO2:  98% 100% 100%  Weight: 97.1 kg     Height:       General exam: Appears calm and comfortable  Respiratory system: Clear to auscultation. Respiratory effort  normal. Cardiovascular system: S1 & S2 heard, RRR. No JVD, murmurs, rubs, gallops or clicks. No pedal edema. Gastrointestinal system: Abdomen is nondistended, soft and nontender. No organomegaly or masses felt. Normal bowel sounds heard. Central nervous system:  Alert and oriented x3. No focal neurological deficits. Extremities: Symmetric 5 x 5 power. Skin: No rashes, lesions or ulcers Psychiatry: Mood & affect appropriate.   Data Reviewed:  Lab results reviewed.  Family Communication: None  Disposition: Status is: Inpatient Remains inpatient appropriate because: Severity of disease, TPN.  Planned Discharge Destination: Home with Home Health    Time spent: 35 minutes  Author: Sharen Hones, MD 06/08/2022 12:57 PM  For on call review [email or web address].

## 2022-06-08 NOTE — TOC Progression Note (Signed)
Transition of Care [facility]) - Progression Note    Patient Details  Name: Calvin [name] MRN: [id number] Date of Birth: [birth year]  Transition of [facility] CM/SW Contact  Beverly Sessions, RN Phone Number: 06/08/2022, 11:34 AM  Clinical Narrative:     Patient now with PICC and TPN.  Per surgery will potentially need PEG placement   Expected Discharge Plan: Skilled Nursing Facility Barriers to Discharge: Continued Medical Work up  Expected Discharge Plan and Services Expected Discharge Plan: [place]   Discharge Planning Services: CM Consult Post Acute Care Choice: Resumption of Svcs/PTA Provider, [place] Living arrangements for the past 2 months: [place]                 DME Arranged: N/A DME Agency: NA       HH Arranged: NA HH Agency: NA         Social Determinants of Health (SDOH) Interventions    Readmission Risk Interventions    06/02/2022   12:02 PM 10/17/2021   10:47 AM  Readmission Risk Prevention Plan  Transportation Screening Complete Complete  PCP or Specialist Appt within 3-5 Days  Complete  Social Work Consult for [place] Planning/Counseling  Complete  Palliative Care Screening  Not Applicable  Medication Review [employer] photographer) Complete Complete  PCP or Specialist appointment within 3-5 days of discharge Complete   HRI or [place] Complete   SW Recovery Care/Counseling Consult Complete   Palliative Care Screening Complete   Skilled Nursing Facility Complete

## 2022-06-08 NOTE — Progress Notes (Signed)
Patient states having pain/stiffness in back and leg and side, primarily on left.  No pain med available due to no NGT and patient NPO.  Requested pain medication via IV.  Will continue to monitor.

## 2022-06-08 NOTE — Progress Notes (Signed)
Mobility Specialist - Progress Note   06/08/22 1200  Mobility  Activity Response Tolerated well  $Mobility charge 1 Mobility  Range of Motion/Exercises Active  Mobility Referral Yes     Pt lying in bed upon arrival, utilizing RA. Pt declined OOB mobility; educated on importance of mobility. Pt voiced pain in entire L side extending from UE down to LE. Participated in bed-level therex. Able to move all extremities. Pt expressed worry about the possibility of having surgery, active listening provided. Pt left in bed with alarm set, needs in reach.    Kathee Delton Mobility Specialist 06/08/22, 12:04 PM

## 2022-06-08 NOTE — Consult Note (Signed)
PHARMACY - TOTAL PARENTERAL NUTRITION CONSULT NOTE   Indication:  Intolerance to enteral feeding  Patient Measurements: Height: '5\' 11"'$  (180.3 cm) Weight: 97.1 kg (214 lb 1.1 oz) IBW/kg (Calculated) : 75.3 TPN AdjBW (KG): 80.5 Body mass index is 29.86 kg/m.  Assessment:  Patient is a 71 y/o M with medical history including Ogilvie's syndrome, HTN, HLD, COPD, CVA, GERD, gout, depression / anxiety, seizures, CAD, EtOH use disorder, CKD, diastolic CHF, colon cancer, thyroid cancer who is admitted with resolved SBO but now with worsening pseudoobstruction. GI consulted. Patient failed to respond to conservative measures with methylnaltrexone and neostigmine x 2. Patient underwent decompressive colonoscopy on 3/14 complicated by colonic distension post-procedurally. Patient underwent repeat decompressive colonoscopy on 10/3. Patient had been on tube feeds at trickle rate. Pharmacy consulted to initiate TPN to help meet nutritional goals given acute abdominal issues and intolerance to enteral nutrition.  Glucose / Insulin: Pre-diabetic range Hgb A1c (5.8% on 05/28/22). Patient has been overall normoglycemic this admission. Electrolytes: Within normal limits. Patient is at high re-feeding risk Renal: Stable CKD, Scr ~ 1 Hepatic: Within normal limits Intake / Output; MIVF: +3.6L for the admission. No MIVF running GI Imaging: 9/24 CT abdomen / pelvis: SBO and chronic colonic ileus or pseudo-obstruction 9/26 Abdominal X-ray: Air-filled distention of large and small bowel loops favoring ileus 9/27 CT abdomen / pelvis: No evidence of SBO 9/29 Abdominal X-ray: Severe dilatation of the colon as can be seen with pseudo-obstruction/colonic ileus 9/30 Abdominal X-ray: Significant interval improvement in colonic distension, which has mostly resolved 10/1 Abdominal X-ray: Persistent gaseous dilation of the colon, though mildly improved compared to 06/02/2022 10/2 Abdominal X-ray: Worsened gaseous distention  of the colon 10/3 Interval placement of colonic decompression tube with decreased colonic gaseous distention.   GI Surgeries / Procedures:  9/29: Decompressive colonoscopy 10/3: Decompressive colonoscopy  Central access: DL PICC placed 10/4 TPN start date: 10/4  Nutritional Goals: Goal TPN rate is 60 mL/hr (provides101 g of protein and 1938 kcals per day)  RD Assessment: Estimated Needs Total Energy Estimated Needs: 1900-2200kcal/day Total Protein Estimated Needs: 95-110g/day Total Fluid Estimated Needs: 1.9-2.2L/day  Current Nutrition:  NPO  Plan:  --per discusiion with RD, attempting to concentrate TPN d/t fluid restriction (hx of HF) --Increase TPN to 60 mL/hr  at 1800 on 10/5 Goal rate TPN will provide: Protein: 101 g (70 g/L of Clinisol 15%) Dextrose: 346 g (24%) Lipids: 36 g (36 g/L) Kcals / day: 1938 Fluids: 1.4 L --Electrolytes in TPN: Na 56mq/L, K 563m/L, Ca 74m52mL, Mg 74mE29m, and Phos 174mm8m. Cl:Ac 1:1 --Add standard MVI and trace elements to TPN and thiamine '100mg'$  x 3 days (day 1 of 3) --Continue Moderate q6h SSI and adjust as needed  --Monitor TPN labs on Mon/Thurs, daily until stable  [employer]/2023,11:55 AM

## 2022-06-09 DIAGNOSIS — I5032 Chronic diastolic (congestive) heart failure: Secondary | ICD-10-CM | POA: Diagnosis not present

## 2022-06-09 DIAGNOSIS — J690 Pneumonitis due to inhalation of food and vomit: Secondary | ICD-10-CM | POA: Diagnosis not present

## 2022-06-09 DIAGNOSIS — K5981 Ogilvie syndrome: Secondary | ICD-10-CM | POA: Diagnosis not present

## 2022-06-09 LAB — COMPREHENSIVE METABOLIC PANEL
ALT: 30 U/L (ref 0–44)
AST: 24 U/L (ref 15–41)
Albumin: 3.5 g/dL (ref 3.5–5.0)
Alkaline Phosphatase: 38 U/L (ref 38–126)
Anion gap: 1 — ABNORMAL LOW (ref 5–15)
BUN: 20 mg/dL (ref 8–23)
CO2: 26 mmol/L (ref 22–32)
Calcium: 9.6 mg/dL (ref 8.9–10.3)
Chloride: 114 mmol/L — ABNORMAL HIGH (ref 98–111)
Creatinine, Ser: 0.97 mg/dL (ref 0.61–1.24)
GFR, Estimated: >60 mL/min (ref >60)
Glucose, Bld: 110 mg/dL — ABNORMAL HIGH (ref 70–99)
Potassium: 4.2 mmol/L (ref 3.5–5.1)
Sodium: 141 mmol/L (ref 135–145)
Total Bilirubin: 0.2 mg/dL — ABNORMAL LOW (ref 0.3–1.2)
Total Protein: 7 g/dL (ref 6.5–8.1)

## 2022-06-09 LAB — GLUCOSE, CAPILLARY
Glucose-Capillary: 115 mg/dL — ABNORMAL HIGH (ref 70–99)
Glucose-Capillary: 127 mg/dL — ABNORMAL HIGH (ref 70–99)
Glucose-Capillary: 132 mg/dL — ABNORMAL HIGH (ref 70–99)
Glucose-Capillary: 147 mg/dL — ABNORMAL HIGH (ref 70–99)

## 2022-06-09 LAB — PHOSPHORUS: Phosphorus: 3.8 mg/dL (ref 2.5–4.6)

## 2022-06-09 LAB — MAGNESIUM: Magnesium: 2.1 mg/dL (ref 1.7–2.4)

## 2022-06-09 LAB — TRIGLYCERIDES: Triglycerides: 97 mg/dL (ref <150)

## 2022-06-09 MED ORDER — TRACE MINERALS CU-MN-SE-ZN 300-55-60-3000 MCG/ML IV SOLN
INTRAVENOUS | Status: AC
  Filled 2022-06-09: qty 672

## 2022-06-09 MED ORDER — SODIUM CHLORIDE 0.9 % IV SOLN: INTRAVENOUS | Status: DC | PRN

## 2022-06-09 NOTE — Progress Notes (Signed)
Progress Note   Patient: Calvin [name] WPY:[id number] DOB: 10-04-1950 DOA: 05/28/2022     12 DOS: the patient was seen and examined on 06/09/2022   Brief hospital course:  Calvin [name] is a 71 y.o. male with medical history significant of Ogilvie's syndrome, ileus, hypertension, hyperlipidemia, COPD, stroke with mild left-sided weakness, GERD, gout, depression with anxiety, seizure, CAD, MI,  alcohol abuse, CKD stage IIIa, dCHF, colon cancer, thyroid cancer, who presents from his facility with Nausea, vomiting, abdominal distention, abdominal pain for 2 days. Found to have new small bowel obstruction on CT abdomen with transition point in the right lower abdomen suspicious for adhesion. General surgery was consulted and NG tube was placed.  Patient also developed left-sided tingling and numbness and facial droop while in ED. CT head and MRI brain was negative for any acute intracranial abnormality and do reflect a chronic changes. 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  MRI of the cervical spine showed C3-C4 moderate to severe spinal stenosis, seen by neurosurgery, recommended outpatient follow-up.  9/25: Continued to have significant colonic distention.  NG suction continued. The patient was transferred to the stepdown unit for neostigmine dosing on 05/31/2022 and 06/01/2022.   Patient had a colonoscopy decompression on 9/29 and 10/3, followed with rectal tube. Tube feeding started on 10/30-10/3.  10/4.  Modified barium swallow showed a significant risk of aspiration.  TPN started after PICC line placed.  Assessment and Plan: Ogilvie's syndrome Gastroenterology gave neostigmine infusion on 05/31/2022 and on 06/01/22.  Decompressive colonoscopy procedure x2.  Currently patient has a rectal tube. Condition continue to improve, abdominal distention is better.  Still has some loose stools daily.   Aspiration pneumonia ([place]) Respiratory distress Dysphagia with a high risk of  aspiration. Due to high risk of aspiration, patient is a placed on n.p.o. Discussed with general surgery and GI, patient is not ready for tube feeding due to significant abdominal distention.  Decisions made for TPN, PICC line has been placed.   Patient probably can have a PEG tube on Monday, will follow with GI.   Paroxysmal atrial flutter (HCC) Continue Lovenox.   Cervical spinal stenosis MRI showing multilevel cervical spondylosis most pronounced at C3-C4 where moderate to severe canal stenosis and severe bilateral foraminal stenosis.  Patient is not a great surgical candidate at this point.    Chronic diastolic CHF (congestive heart failure) (HCC) -Last EF 50% No CHF exacerbation. Still stable, no evidence of volume overload.   Seizures ([place]) Seizure -IV Keppra and Depakote.   HTN (hypertension) Currently n.p.o., continue IV metoprolol as needed.   CAD (coronary artery disease) Stable.   HLD (hyperlipidemia) Hold Zocor   Chronic kidney disease, stage 3a (HCC) Stable.   COPD (chronic obstructive pulmonary disease) (HCC) Continue nebulizers   Coffee ground emesis No recurrence.   Tobacco abuse -nicotine patch   Depression with anxiety Hold off blood pressure medicine as patient currently n.p.o.   Neuropathy Hold Neurontin as patient is n.p.o.      Subjective:  Patient doing well, he slept last night.  Still sleepy this morning, no shortness of breath.  No abdominal pain or nausea vomiting.  Physical Exam: Vitals:   06/08/22 1850 06/08/22 1937 06/09/22 0329 06/09/22 0825  BP: 136/80  116/78 106/66  Pulse: 82  86 74  Resp: '17  17 18  '$ Temp: 98.7 F (37.1 C)  98.4 F (36.9 C) 98 F (36.7 C)  TempSrc:      SpO2: 99% 97%  100% 100%  Weight:      Height:       General exam: Appears calm and comfortable  Respiratory system: Clear to auscultation. Respiratory effort normal. Cardiovascular system: S1 & S2 heard, RRR. No JVD, murmurs, rubs, gallops or  clicks. No pedal edema. Gastrointestinal system: Abdomen is nondistended, soft and nontender. No organomegaly or masses felt. Normal bowel sounds heard. Central nervous system: Sleepy and oriented x2. No focal neurological deficits. Extremities: Symmetric 5 x 5 power. Skin: No rashes, lesions or ulcers Psychiatry: Mood & affect appropriate.   Data Reviewed:  Lab results reviewed.  Family Communication:   Disposition: Status is: Inpatient Remains inpatient appropriate because: Severity of disease, IV treatment  Planned Discharge Destination:  TBD    Time spent: 35 minutes  Author: Sharen Hones, MD 06/09/2022 10:22 AM  For on call review [email or web address].

## 2022-06-09 NOTE — Progress Notes (Signed)
GI Inpatient Follow-up Note  Subjective:  Patient seen and was sitting up at the bed. Abdomen soft. Not able to provide much in terms of review of systems.  Scheduled Inpatient Medications:   budesonide (PULMICORT) nebulizer solution  0.5 mg Nebulization BID   Chlorhexidine Gluconate Cloth  6 each Topical Daily   ciprofloxacin  1 drop Both Eyes Q4H while awake   enoxaparin (LOVENOX) injection  1 mg/kg Subcutaneous BID   insulin aspart  0-9 Units Subcutaneous Q6H   lidocaine HCl (PF)  10 mL Other Once   LORazepam  1 mg Intravenous QHS   multivitamin  15 mL Per Tube Daily   nicotine  21 mg Transdermal Daily   pantoprazole (PROTONIX) IV  40 mg Intravenous Q24H   polyethylene glycol  17 g Per Tube BID   sodium chloride flush  10-40 mL Intracatheter D98P   trolamine salicylate   Topical BID    Continuous Inpatient Infusions:    sodium chloride 10 mL/hr at 06/09/22 1357   levETIRAcetam Stopped (06/09/22 0815)   TPN ADULT (ION) 60 mL/hr at 06/09/22 1357   TPN ADULT (ION)     valproate sodium Stopped (06/09/22 1337)    PRN Inpatient Medications:  sodium chloride, albuterol, bisacodyl, hydrALAZINE, lidocaine, LORazepam, LORazepam, metoprolol tartrate, nitroGLYCERIN, ondansetron (ZOFRAN) IV, phenol, polyvinyl alcohol, senna-docusate, sodium chloride flush, trolamine salicylate  Review of Systems:  Unable to adequately assess   Physical Examination: BP 106/66 (BP Location: Left Arm)   Pulse 74   Temp 98 F (36.7 C)   Resp 18   Ht '5\' 11"'$  (1.803 m)   Wt 97.1 kg   SpO2 100%   BMI 29.86 kg/m  Gen: NAD HEENT: PEERLA, EOMI, Neck: supple, no JVD or thyromegaly Chest: No respiratory distress Abd: soft, non-tender, non-distended Ext: no edema, well perfused with 2+ pulses, Skin: no rash or lesions noted Lymph: no LAD  Data: Lab Results  Component Value Date   WBC 6.8 06/08/2022   HGB 12.6 (L) 06/08/2022   HCT 39.9 06/08/2022   MCV 95.7 06/08/2022   PLT 247 06/08/2022    Recent Labs  Lab 06/05/22 0613 06/06/22 0433 06/08/22 0455  HGB 11.7* 11.8* 12.6*   Lab Results  Component Value Date   NA 141 06/09/2022   K 4.2 06/09/2022   CL 114 (H) 06/09/2022   CO2 26 06/09/2022   BUN 20 06/09/2022   CREATININE 0.97 06/09/2022   Lab Results  Component Value Date   ALT 30 06/09/2022   AST 24 06/09/2022   ALKPHOS 38 06/09/2022   BILITOT 0.2 (L) 06/09/2022   No results for input(s): "APTT", "INR", "PTT" in the last 168 hours. Assessment/Plan: Mr. Penrose is a 71 y.o. gentleman with history of chronic ogilvie's syndrome, COPD, hypertension, hyperlipidemia, and multiple other medical comorbidities here with resolved SBO and now worsening pseudoobstruction we have been consulted for ogilvie's syndrome. Underwent decompressive colonoscopy on 9/29 but not surprisingly, colonic distension has recurred which can happen 40% of the time after decompressive colonoscopy. Underwent repeat decompressive colonoscopy on 10/3 with decompression tube left in place  Recommendations:  - will clamp tube to see if he can pass gas or have bowel movements on his own. If he can't then placing a peg tube would not be very helpful but if he can discuss with IR about PEG tube placement - will consider pulling tube when passing more flatus/stool from below - continue TPN - avoid all narcotics - replete electrolytes aggressively - can give  miralax through the decompression tube - aggressive PT/OT, out of bed to the chair   Please call with any questions or concerns. Dr. Vicente Males will be covering over the weekend.   Raylene Miyamoto MD, MPH [place]

## 2022-06-09 NOTE — Consult Note (Signed)
PHARMACY - TOTAL PARENTERAL NUTRITION CONSULT NOTE   Indication:  Intolerance to enteral feeding  Patient Measurements: Height: '5\' 11"'$  (180.3 cm) Weight: 97.1 kg (214 lb 1.1 oz) IBW/kg (Calculated) : 75.3 TPN AdjBW (KG): 80.5 Body mass index is 29.86 kg/m.  Assessment:  Patient is a 71 y/o M with medical history including Ogilvie's syndrome, HTN, HLD, COPD, CVA, GERD, gout, depression / anxiety, seizures, CAD, EtOH use disorder, CKD, diastolic CHF, colon cancer, thyroid cancer who is admitted with resolved SBO but now with worsening pseudoobstruction. GI consulted. Patient failed to respond to conservative measures with methylnaltrexone and neostigmine x 2. Patient underwent decompressive colonoscopy on 4/85 complicated by colonic distension post-procedurally. Patient underwent repeat decompressive colonoscopy on 10/3. Patient had been on tube feeds at trickle rate. Pharmacy consulted to initiate TPN to help meet nutritional goals given acute abdominal issues and intolerance to enteral nutrition.  Glucose / Insulin: Pre-diabetic range Hgb A1c (5.8% on 05/28/22). Last 24 hrs- 3 units insulin. Electrolytes: Within normal limits. Patient is at high re-feeding risk Renal: Stable CKD, Scr ~ 1 Hepatic: Within normal limits Intake / Output; MIVF: +3.9L for the admission. No MIVF running GI Imaging: 9/24 CT abdomen / pelvis: SBO and chronic colonic ileus or pseudo-obstruction 9/26 Abdominal X-ray: Air-filled distention of large and small bowel loops favoring ileus 9/27 CT abdomen / pelvis: No evidence of SBO 9/29 Abdominal X-ray: Severe dilatation of the colon as can be seen with pseudo-obstruction/colonic ileus 9/30 Abdominal X-ray: Significant interval improvement in colonic distension, which has mostly resolved 10/1 Abdominal X-ray: Persistent gaseous dilation of the colon, though mildly improved compared to 06/02/2022 10/2 Abdominal X-ray: Worsened gaseous distention of the colon 10/3  Interval placement of colonic decompression tube with decreased colonic gaseous distention.   GI Surgeries / Procedures:  9/29: Decompressive colonoscopy 10/3: Decompressive colonoscopy  Central access: DL PICC placed 10/4 TPN start date: 10/4  Nutritional Goals: Goal TPN rate is 60 mL/hr (provides101 g of protein and 1938 kcals per day)  RD Assessment: Estimated Needs Total Energy Estimated Needs: 1900-2200kcal/day Total Protein Estimated Needs: 95-110g/day Total Fluid Estimated Needs: 1.9-2.2L/day  Current Nutrition:  NPO  Plan:  --per discusiion with RD, attempting to concentrate TPN d/t fluid restriction (hx of HF) --Increase TPN to 60 mL/hr  at 1800 on 10/5 Goal rate TPN will provide: Protein: 101 g (70 g/L of Clinisol 15%) Dextrose: 346 g (24%) Lipids: 36 g (36 g/L) Kcals / day: 1938 Fluids: 1.4 L --Electrolytes in TPN: Na 60mq/L, K 530m/L, Ca 54m63mL, Mg 54mE13m, and Phos 154mm26m. Cl:Ac 1:1 --Add standard MVI and trace elements to TPN and thiamine '100mg'$  x 3 days (day 2 of 3) --Continue Moderate q6h SSI and adjust as needed  --Monitor TPN labs on Mon/Thurs, daily until stable  JustiAlison Murray/2023,11:23 AM

## 2022-06-09 NOTE — Progress Notes (Signed)
Dr Haig Prophet said to take the rectal tube off low-intermittent suction and see how he does. We'll leave it clamped over the weekend to see if he can have any bowel movements by himself.

## 2022-06-09 NOTE — Plan of Care (Signed)
  Problem: Education: Goal: Knowledge of General Education information will improve Description: Including pain rating scale, medication(s)/side effects and non-pharmacologic comfort measures Outcome: Progressing   Problem: Clinical Measurements: Goal: Will remain free from infection Outcome: Progressing   Problem: Clinical Measurements: Goal: Respiratory complications will improve 06/09/2022 0254 by Liliane Channel, RN Outcome: Progressing 06/08/2022 2326 by Liliane Channel, RN Outcome: Progressing   Problem: Clinical Measurements: Goal: Cardiovascular complication will be avoided 06/09/2022 0254 by Liliane Channel, RN Outcome: Progressing 06/08/2022 2326 by Liliane Channel, RN Outcome: Progressing   Problem: Elimination: Goal: Will not experience complications related to bowel motility Outcome: Progressing   Problem: Elimination: Goal: Will not experience complications related to urinary retention Outcome: Progressing   Problem: Pain Managment: Goal: General experience of comfort will improve 06/09/2022 0259 by Liliane Channel, RN Outcome: Progressing 06/08/2022 2326 by Liliane Channel, RN Outcome: Progressing   Problem: Safety: Goal: Ability to remain free from injury will improve 06/09/2022 0259 by Liliane Channel, RN Outcome: Progressing 06/08/2022 2326 by Liliane Channel, RN Outcome: Progressing   Problem: Education: Goal: Knowledge of secondary prevention will improve ([facility]) Outcome: Progressing

## 2022-06-09 NOTE — Progress Notes (Signed)
Patient no longer has an NG tube and is unable to get a.m. miralax and multivitamin. Dr Roosevelt Locks notified and is reviewing meds. Order received to discontinue telemetry

## 2022-06-09 NOTE — Progress Notes (Signed)
Physical Therapy Treatment Patient Details Name: Calvin [name] MRN: [id number] DOB: [birth year] Today's Date: 06/09/2022   History of Present Illness Calvin [name] is a 10yoM who [facility] on 05/28/22 from facility SBO. PMH: ileus, HTN, HLD, COPD, multifocal CVA c Left hemiplegia and most recent MRI showing "remote infarcts in the right occipital, lateral frontal cortex and in the left cerebellum," GERD, gout, depression, GAD, seizure, CAD, MI, ETOH abuse, CKD3a, dCHF, colon CA, thyroid CA, chronic vertebral artery occlusion, cervical spondylosis with most recent MRI showing "Multilevel cervical spondylosis, most pronounced at the C3-4 level where there is moderate-to-severe canal stenosis and severe  bilateral foraminal stenosis. Findings slightly progressed from  prior MRI."    PT Comments    Pt in bed, rectal wall suction in place, purewick in place. Pt reports yesterday had a lot of congestion in is throat, improved today. He is sick of being in bed, wanted to get up and move around a bit, but did not attempt. Pt assisted with leg exercises in bed, then seated at EOB for >76mn for core strength and postural endurance. Pt demonstrates good trunk control at EOB. Requires elevated bed for minGUard level transers. Pt needs considerable help getting Left hand to RW and keeping in place, unclear how much he is really able to weight bear once standing. Bed mobility still requires maxA +2 2/2 weakness despite cognitive recall of prior motor strategies. Pt left in bed at EOB, recliner mode, HOB at 48 degrees.      Recommendations for follow up therapy are one component of a multi-disciplinary discharge planning process, led by the attending physician.  Recommendations may be updated based on patient status, additional functional criteria and insurance authorization.  Follow Up Recommendations  Skilled nursing-short term rehab (<3 hours/day) Can patient physically be transported by private vehicle: No    Assistance Recommended at Discharge Intermittent Supervision/Assistance  Patient can return home with the following Assistance with cooking/housework;Direct supervision/assist for financial management;A lot of help with walking and/or transfers;Assist for transportation;Direct supervision/assist for medications management;A lot of help with bathing/dressing/bathroom   Equipment Recommendations  None recommended by PT    Recommendations for Other Services       Precautions / Restrictions Precautions Precautions: Fall Precaution Comments: Seizure precautions Restrictions Weight Bearing Restrictions: No     Mobility  Bed Mobility Overal bed mobility: Needs Assistance Bed Mobility: Sit to Supine, Supine to Sit, Rolling Rolling: Max assist, +2 for physical assistance   Supine to sit: Max assist, +2 for physical assistance Sit to supine: Max assist, +2 for physical assistance        Transfers Overall transfer level: Needs assistance Equipment used: Rolling walker (2 wheels) Transfers: Sit to/from Stand Sit to Stand: From elevated surface, Min guard           General transfer comment: manual fixation of Lt hand on RW    Ambulation/Gait Ambulation/Gait assistance:  (attrempts some side stepping with modA, but very limited in walking due to pronounced weakness of Left hand on RW)                 Stairs             Wheelchair Mobility    Modified Rankin (Stroke Patients Only)       Balance  Cognition Arousal/Alertness: Awake/alert Behavior During Therapy: Flat affect Overall Cognitive Status: History of cognitive impairments - at baseline                                 General Comments: appropriate, follows simple commands        Exercises General Exercises - Lower Extremity Short Arc Quad: AROM, Both, 10 reps, Supine Heel Slides: AAROM, Both, Supine, 15 reps Hip  ABduction/ADduction: AAROM, Both, 15 reps, Supine Mini-Sqauts: Strengthening, Both, 10 reps, Supine Other Exercises Other Exercises: Static sitting at the EOB x 20 min for postural control and improved activity tolerance, pulmonary toilet Other Exercises: STS from elevated EOB x3 Other Exercises: sustained standing at bedside 3x30sec    General Comments        Pertinent Vitals/Pain Pain Assessment Pain Assessment: Faces Faces Pain Scale: Hurts little more (Left hand/arm)    Home Living                          Prior Function            PT Goals (current goals can now be found in the care plan section) Acute Rehab PT Goals PT Goal Formulation: With patient Progress towards PT goals: Progressing toward goals    Frequency    Min 2X/week      PT Plan Current plan remains appropriate    Co-evaluation              AM-PAC PT "6 Clicks" Mobility   Outcome Measure  Help needed turning from your back to your side while in a flat bed without using bedrails?: Total Help needed moving from lying on your back to sitting on the side of a flat bed without using bedrails?: Total Help needed moving to and from a bed to a chair (including a wheelchair)?: Total Help needed standing up from a chair using your arms (e.g., wheelchair or bedside chair)?: Total Help needed to walk in hospital room?: Total Help needed climbing 3-5 steps with a railing? : Total 6 Click Score: 6    End of Session   Activity Tolerance: Patient tolerated treatment well Patient left: in bed;with call bell/phone within reach (in recliner position, HOB at 48 degrees, heels floating) Nurse Communication: Mobility status PT Visit Diagnosis: Muscle weakness (generalized) (M62.81);Difficulty in walking, not elsewhere classified (R26.2)     Time: 5361-4431 PT Time Calculation (min) (ACUTE ONLY): 44 min  Charges:  $Therapeutic Exercise: 38-52 mins                    12:35 PM, 06/09/22 Etta Grandchild, PT, DPT Physical Therapist - [facility]  [phone] ([place])    Latonya Knight C 06/09/2022, 12:32 PM

## 2022-06-10 DIAGNOSIS — J690 Pneumonitis due to inhalation of food and vomit: Secondary | ICD-10-CM | POA: Diagnosis not present

## 2022-06-10 DIAGNOSIS — K5981 Ogilvie syndrome: Secondary | ICD-10-CM | POA: Diagnosis not present

## 2022-06-10 DIAGNOSIS — I4892 Unspecified atrial flutter: Secondary | ICD-10-CM | POA: Diagnosis not present

## 2022-06-10 LAB — GLUCOSE, CAPILLARY
Glucose-Capillary: 133 mg/dL — ABNORMAL HIGH (ref 70–99)
Glucose-Capillary: 98 mg/dL (ref 70–99)
Glucose-Capillary: 136 mg/dL — ABNORMAL HIGH (ref 70–99)
Glucose-Capillary: 130 mg/dL — ABNORMAL HIGH (ref 70–99)

## 2022-06-10 MED ORDER — TRACE MINERALS CU-MN-SE-ZN 300-55-60-3000 MCG/ML IV SOLN
INTRAVENOUS | Status: AC
  Filled 2022-06-10: qty 672

## 2022-06-10 NOTE — Progress Notes (Signed)
Progress Note   Patient: Calvin [name] WIO:[id number] DOB: 1951-03-08 DOA: 05/28/2022     13 DOS: the patient was seen and examined on 06/10/2022   Brief hospital course:  Calvin [name] is a 71 y.o. male with medical history significant of Ogilvie's syndrome, ileus, hypertension, hyperlipidemia, COPD, stroke with mild left-sided weakness, GERD, gout, depression with anxiety, seizure, CAD, MI,  alcohol abuse, CKD stage IIIa, dCHF, colon cancer, thyroid cancer, who presents from his facility with Nausea, vomiting, abdominal distention, abdominal pain for 2 days. Found to have new small bowel obstruction on CT abdomen with transition point in the right lower abdomen suspicious for adhesion. General surgery was consulted and NG tube was placed.  Patient also developed left-sided tingling and numbness and facial droop while in ED. CT head and MRI brain was negative for any acute intracranial abnormality and do reflect a chronic changes. 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  MRI of the cervical spine showed C3-C4 moderate to severe spinal stenosis, seen by neurosurgery, recommended outpatient follow-up.  9/25: Continued to have significant colonic distention.  NG suction continued. The patient was transferred to the stepdown unit for neostigmine dosing on 05/31/2022 and 06/01/2022.   Patient had a colonoscopy decompression on 9/29 and 10/3, followed with rectal tube. Tube feeding started on 10/30-10/3.  10/4.  Modified barium swallow showed a significant risk of aspiration.  TPN started after PICC line placed.  Assessment and Plan:  Ogilvie's syndrome Gastroenterology gave neostigmine infusion on 05/31/2022 and on 06/01/22.  Decompressive colonoscopy procedure x2.  Currently patient has a rectal tube. Appreciate GI consult, rectal tube was clamped yesterday, he had a bowel movement per rectum yesterday.  Currently pending decision by GI for removing rectal tube, and the possibility of  a PEG tube.   Aspiration pneumonia ([place]) Respiratory distress Dysphagia with a high risk of aspiration. Due to high risk of aspiration, patient is a placed on n.p.o. Discussed with general surgery and GI, patient is not ready for tube feeding due to significant abdominal distention.  Decisions made for TPN, PICC line has been placed.   Continue TPN, pending decision for PEG tube.   Paroxysmal atrial flutter (HCC) Continue Lovenox.   Cervical spinal stenosis MRI showing multilevel cervical spondylosis most pronounced at C3-C4 where moderate to severe canal stenosis and severe bilateral foraminal stenosis.  Patient is not a great surgical candidate at this point.    Chronic diastolic CHF (congestive heart failure) (HCC) -Last EF 50% No CHF exacerbation. Still stable, no evidence of volume overload.   Seizures ([place]) Seizure -IV Keppra and Depakote.   HTN (hypertension) Currently n.p.o., continue IV metoprolol as needed.   CAD (coronary artery disease) Stable.   HLD (hyperlipidemia) Hold Zocor   Chronic kidney disease, stage 3a (HCC) Stable.   COPD (chronic obstructive pulmonary disease) (HCC) Continue nebulizers   Coffee ground emesis No recurrence.   Tobacco abuse -nicotine patch   Depression with anxiety Hold off blood pressure medicine as patient currently n.p.o.   Neuropathy Hold Neurontin as patient is n.p.o.     Subjective:  Patient is doing better, he had a documented bowel movement after rectal tube clamped.  No nausea vomiting abdominal pain.  No shortness of breath.  Physical Exam: Vitals:   06/09/22 1628 06/09/22 2251 06/10/22 0537 06/10/22 0755  BP: (!) 154/95 116/77 128/78 128/70  Pulse: 85 83 85 79  Resp: '16 18 20 18  '$ Temp: 97.6 F (36.4 C) 98 F (36.7 C)  98.1 F (36.7 C) 97.9 F (36.6 C)  TempSrc:  Oral Oral Oral  SpO2: (!) 87% 100% 100% 99%  Weight:      Height:       General exam: Appears calm and comfortable  Respiratory system:  Clear to auscultation. Respiratory effort normal. Cardiovascular system: S1 & S2 heard, RRR. No JVD, murmurs, rubs, gallops or clicks. No pedal edema. Gastrointestinal system: Abdomen is nondistended, soft and nontender. No organomegaly or masses felt. Normal bowel sounds heard. Central nervous system: Alert and oriented x2. No focal neurological deficits. Extremities: Symmetric 5 x 5 power. Skin: No rashes, lesions or ulcers Psychiatry:Mood & affect appropriate.   Data Reviewed:  Lab results reviewed.  Family Communication:   Disposition: Status is: Inpatient Remains inpatient appropriate because: Severity of disease, IV treatment.  Planned Discharge Destination: Skilled nursing facility    Time spent: 35 minutes  Author: Sharen Hones, MD 06/10/2022 1:07 PM  For on call review [email or web address].

## 2022-06-10 NOTE — Consult Note (Signed)
PHARMACY - TOTAL PARENTERAL NUTRITION CONSULT NOTE   Indication:  Intolerance to enteral feeding  Patient Measurements: Height: '5\' 11"'$  (180.3 cm) Weight: 97.1 kg (214 lb 1.1 oz) IBW/kg (Calculated) : 75.3 TPN AdjBW (KG): 80.5 Body mass index is 29.86 kg/m.  Assessment:  Patient is a 71 y/o M with medical history including Ogilvie's syndrome, HTN, HLD, COPD, CVA, GERD, gout, depression / anxiety, seizures, CAD, EtOH use disorder, CKD, diastolic CHF, colon cancer, thyroid cancer who is admitted with resolved SBO but now with worsening pseudoobstruction. GI consulted. Patient failed to respond to conservative measures with methylnaltrexone and neostigmine x 2. Patient underwent decompressive colonoscopy on 5/05 complicated by colonic distension post-procedurally. Patient underwent repeat decompressive colonoscopy on 10/3. Patient had been on tube feeds at trickle rate. Pharmacy consulted to initiate TPN to help meet nutritional goals given acute abdominal issues and intolerance to enteral nutrition.  Glucose / Insulin: Pre-diabetic range Hgb A1c (5.8% on 05/28/22). Last 24 hrs: 4 units insulin. Electrolytes: Within normal limits Renal: Stable CKD, Scr ~ 1 Hepatic: Within normal limits Intake / Output; MIVF: +5.1L for the admission. No MIVF running GI Imaging: 9/24 CT abdomen / pelvis: SBO and chronic colonic ileus or pseudo-obstruction 9/26 Abdominal X-ray: Air-filled distention of large and small bowel loops favoring ileus 9/27 CT abdomen / pelvis: No evidence of SBO 9/29 Abdominal X-ray: Severe dilatation of the colon as can be seen with pseudo-obstruction/colonic ileus 9/30 Abdominal X-ray: Significant interval improvement in colonic distension, which has mostly resolved 10/1 Abdominal X-ray: Persistent gaseous dilation of the colon, though mildly improved compared to 06/02/2022 10/2 Abdominal X-ray: Worsened gaseous distention of the colon 10/3 Interval placement of colonic decompression  tube with decreased colonic gaseous distention. GI Surgeries / Procedures:  9/29: Decompressive colonoscopy 10/3: Decompressive colonoscopy  Central access: DL PICC placed 10/4 TPN start date: 10/4  Nutritional Goals: Goal TPN rate is 60 mL/hr (provides101 g of protein and 1938 kcals per day)  RD Assessment: Estimated Needs Total Energy Estimated Needs: 1900-2200kcal/day Total Protein Estimated Needs: 95-110g/day Total Fluid Estimated Needs: 1.9-2.2L/day  Current Nutrition:  NPO  Plan:  --Per discussion with RD, attempting to concentrate TPN d/t fluid restriction (hx of HF) --Continue TPN at 60 mL/hr (goal rate) Goal rate TPN will provide: Protein: 101 g (70 g/L of Clinisol 15%) Dextrose: 346 g (24%) Lipids: 36 g (36 g/L) Kcals / day: 1938 Fluids: 1.4 L --Electrolytes in TPN: Na 52mq/L, K 529m/L, Ca 47m16mL, Mg 47mE19m, and Phos 147mm547m. Cl:Ac 1:1 --Add standard MVI and trace elements to TPN and thiamine '100mg'$  x 3 days (day 3 of 3) --Continue Moderate q6h SSI and adjust as needed  --Monitor TPN labs on Mon/Thurs  Etoile Looman B Gerrard Crystal 06/10/2022,8:17 AM

## 2022-06-10 NOTE — Plan of Care (Signed)
  Problem: Clinical Measurements: Goal: Will remain free from infection Outcome: Progressing Goal: Respiratory complications will improve Outcome: Progressing Goal: Cardiovascular complication will be avoided Outcome: Progressing   

## 2022-06-11 ENCOUNTER — Inpatient Hospital Stay: Payer: Medicare Other

## 2022-06-11 DIAGNOSIS — I4892 Unspecified atrial flutter: Secondary | ICD-10-CM | POA: Diagnosis not present

## 2022-06-11 DIAGNOSIS — K5981 Ogilvie syndrome: Secondary | ICD-10-CM | POA: Diagnosis not present

## 2022-06-11 DIAGNOSIS — J690 Pneumonitis due to inhalation of food and vomit: Secondary | ICD-10-CM | POA: Diagnosis not present

## 2022-06-11 LAB — GLUCOSE, CAPILLARY
Glucose-Capillary: 129 mg/dL — ABNORMAL HIGH (ref 70–99)
Glucose-Capillary: 124 mg/dL — ABNORMAL HIGH (ref 70–99)
Glucose-Capillary: 124 mg/dL — ABNORMAL HIGH (ref 70–99)

## 2022-06-11 MED ORDER — TRACE MINERALS CU-MN-SE-ZN 300-55-60-3000 MCG/ML IV SOLN
INTRAVENOUS | Status: AC
  Filled 2022-06-11: qty 672

## 2022-06-11 MED ORDER — BISACODYL 10 MG RE SUPP
10.0000 mg | Freq: Every day | RECTAL | Status: DC
  Administered 2022-06-11 – 2022-06-15 (×5): 10 mg via RECTAL
  Filled 2022-06-11 (×6): qty 1

## 2022-06-11 MED ORDER — TRACE MINERALS CU-MN-SE-ZN 300-55-60-3000 MCG/ML IV SOLN: INTRAVENOUS | Status: DC

## 2022-06-11 NOTE — Progress Notes (Signed)
Progress Note   Patient: Calvin [name]:[id number] DOB: 1950-11-30 DOA: 05/28/2022     14 DOS: the patient was seen and examined on 06/11/2022   Brief hospital course:  Calvin [name] is a 71 y.o. male with medical history significant of Ogilvie's syndrome, ileus, hypertension, hyperlipidemia, COPD, stroke with mild left-sided weakness, GERD, gout, depression with anxiety, seizure, CAD, MI,  alcohol abuse, CKD stage IIIa, dCHF, colon cancer, thyroid cancer, who presents from his facility with Nausea, vomiting, abdominal distention, abdominal pain for 2 days. Found to have new small bowel obstruction on CT abdomen with transition point in the right lower abdomen suspicious for adhesion. General surgery was consulted and NG tube was placed.  Patient also developed left-sided tingling and numbness and facial droop while in ED. CT head and MRI brain was negative for any acute intracranial abnormality and do reflect a chronic changes. 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  MRI of the cervical spine showed C3-C4 moderate to severe spinal stenosis, seen by neurosurgery, recommended outpatient follow-up.  9/25: Continued to have significant colonic distention.  NG suction continued. The patient was transferred to the stepdown unit for neostigmine dosing on 05/31/2022 and 06/01/2022.   Patient had a colonoscopy decompression on 9/29 and 10/3, followed with rectal tube. Tube feeding started on 10/30-10/3.  10/4.  Modified barium swallow showed a significant risk of aspiration.  TPN started after PICC line placed.  Assessment and Plan: Ogilvie's syndrome Gastroenterology gave neostigmine infusion on 05/31/2022 and on 06/01/22.  Decompressive colonoscopy procedure x2.  Currently patient has a rectal tube. Rectal tube clamped 10/6, patient had a bowel movement on the same day.  Rectal tube removed on 10/7. Patient did not have any bowel movement yesterday, will give Dulcolax suppository  daily for now.    Aspiration pneumonia ([place]) Respiratory distress Dysphagia with a high risk of aspiration. History of stroke. Due to high risk of aspiration, patient is a placed on n.p.o. Discussed with general surgery and GI, patient is not ready for tube feeding due to significant abdominal distention.  Decisions made for TPN, PICC line has been placed.   Patient currently receiving TPN, tolerating well.  Monitor electrolytes.  Discussed with patient daughter, patient did not have problem with swallowing before admission to the hospital.  Initial MRI of the brain showed multiple old strokes, no new stroke.  Dysphagia may be due to malnutrition and weakness.  Patient has been treated with TPN, I will ask speech therapy to reevaluate the patient tomorrow to see if he had any improvement.  We will make a decision about PEG tube if patient still cannot swallow.   Paroxysmal atrial flutter (HCC) Continue Lovenox.   Cervical spinal stenosis MRI showing multilevel cervical spondylosis most pronounced at C3-C4 where moderate to severe canal stenosis and severe bilateral foraminal stenosis.  Patient is not a great surgical candidate at this point.    Chronic diastolic CHF (congestive heart failure) (HCC) -Last EF 50% No CHF exacerbation. Still stable, no evidence of volume overload.   Seizures ([place]) Seizure -IV Keppra and Depakote.   HTN (hypertension) Currently n.p.o., continue IV metoprolol as needed.   CAD (coronary artery disease) Stable.   HLD (hyperlipidemia) Hold Zocor   Chronic kidney disease, stage 3a (HCC) Stable.   COPD (chronic obstructive pulmonary disease) (HCC) Continue nebulizers   Coffee ground emesis No recurrence.   Tobacco abuse -nicotine patch   Depression with anxiety Hold off blood pressure medicine as patient currently n.p.o.  Neuropathy Hold Neurontin as patient is n.p.o.        Subjective:  Patient is doing well today, currently has  no complaints.  Last bowel movement was 10/6.  Physical Exam: Vitals:   06/10/22 2048 06/10/22 2109 06/11/22 0525 06/11/22 0753  BP:  133/83 117/74   Pulse:  87 81 81  Resp:  '16 16 18  '$ Temp:  98.3 F (36.8 C) 97.8 F (36.6 C) 97.9 F (36.6 C)  TempSrc:  Oral Oral   SpO2: 97% 100% 98% 100%  Weight:      Height:       General exam: Appears calm and comfortable  Respiratory system: Clear to auscultation. Respiratory effort normal. Cardiovascular system: S1 & S2 heard, RRR. No JVD, murmurs, rubs, gallops or clicks. No pedal edema. Gastrointestinal system: Abdomen is nondistended, soft and nontender. No organomegaly or masses felt. Normal bowel sounds heard. Central nervous system: Alert and oriented x2. No focal neurological deficits. Extremities: Symmetric 5 x 5 power. Skin: No rashes, lesions or ulcers Psychiatry: Mood & affect appropriate.   Data Reviewed:  Lab results reviewed  Family Communication: daughter updated   Disposition: Status is: Inpatient Remains inpatient appropriate because: Severity of disease, IV treatments.  Planned Discharge Destination: Skilled nursing facility    Time spent: 55 minutes  Author: Sharen Hones, MD 06/11/2022 1:36 PM  For on call review [email or web address].

## 2022-06-11 NOTE — Progress Notes (Signed)
Patient refused swallow evaluation stating his stomach still hurts. I attempted to explain that I was not going to feed him, but only perform a quick test that requires him to swallow a very small amount of water and he declined still. I passed this on to the oncoming nurse. She may have better luck.

## 2022-06-11 NOTE — Progress Notes (Signed)
SLP Cancellation Note  Patient Details Name: Calvin [name] MRN: [id number] DOB: September 19, 1950   Cancelled treatment:       Reason Eval/Treat Not Completed: Medical issues which prohibited therapy (known dysphagia that prohibts clinical assessment) Orders received for repeat bedside swallow assessment. MBSS completed on 06/07/22, revealing "Pt appears to present w/ MOD-SEVERE oropharyngeal phase Dysphagia w/ Neuromuscular deficits resulting laryngeal penetration/aspiration at this MBSS today. Pt appears at HIGH risk for Aspiration (and for Pulmonary decline) w/ current swallowing presentation," and "airway protection is reduced during the swallow. Pt had delayed or absent sensation of penetration/aspiration," and "pt had to be CUED to throat clear/cough to clear laryngeal penetration/aspiration d/t the SILENT nature of awareness/sensation." Given know silent nature of penetration/aspiration further clinical assessment is not warranted. MD in agreement and requested therapist follow up with family given questions/concerns for pt's swallow function. SLP spoke with daughter on the phone with brother and sister in law in the room; summary of assessment, rationale for recommendations, and risk of aspiration shared with family. All reported and all questions answered upon therapist retreat.   If medically indicated, pt would require repeat instrumental assessment prior to determination of safe diet. Given medical complexity, severity of deficits, and recency of initial MBSS, a repeat instrumental is not currently recommended. However, defer to MD for decision for need for instrumental assessment.   [name] MS CCC-SLP  [name] 06/11/2022, 4:42 PM

## 2022-06-11 NOTE — Consult Note (Signed)
PHARMACY - TOTAL PARENTERAL NUTRITION CONSULT NOTE   Indication:  Intolerance to enteral feeding  Patient Measurements: Height: '5\' 11"'$  (180.3 cm) Weight: 97.1 kg (214 lb 1.1 oz) IBW/kg (Calculated) : 75.3 TPN AdjBW (KG): 80.5 Body mass index is 29.86 kg/m.  Assessment:  Patient is a 71 y/o M with medical history including Ogilvie's syndrome, HTN, HLD, COPD, CVA, GERD, gout, depression / anxiety, seizures, CAD, EtOH use disorder, CKD, diastolic CHF, colon cancer, thyroid cancer who is admitted with resolved SBO but now with worsening pseudoobstruction. GI consulted. Patient failed to respond to conservative measures with methylnaltrexone and neostigmine x 2. Patient underwent decompressive colonoscopy on 7/12 complicated by colonic distension post-procedurally. Patient underwent repeat decompressive colonoscopy on 10/3. Patient had been on tube feeds at trickle rate. Pharmacy consulted to initiate TPN to help meet nutritional goals given acute abdominal issues and intolerance to enteral nutrition.  Glucose / Insulin: Pre-diabetic range Hgb A1c (5.8% on 05/28/22). Last 24 hrs: 4 units insulin. Electrolytes: Within normal limits Renal: Stable CKD, Scr ~ 1 Hepatic: Within normal limits Intake / Output; MIVF: +6.9L for the admission. No MIVF running GI Imaging: 9/24 CT abdomen / pelvis: SBO and chronic colonic ileus or pseudo-obstruction 9/26 Abdominal X-ray: Air-filled distention of large and small bowel loops favoring ileus 9/27 CT abdomen / pelvis: No evidence of SBO 9/29 Abdominal X-ray: Severe dilatation of the colon as can be seen with pseudo-obstruction/colonic ileus 9/30 Abdominal X-ray: Significant interval improvement in colonic distension, which has mostly resolved 10/1 Abdominal X-ray: Persistent gaseous dilation of the colon, though mildly improved compared to 06/02/2022 10/2 Abdominal X-ray: Worsened gaseous distention of the colon 10/3 Interval placement of colonic decompression  tube with decreased colonic gaseous distention. GI Surgeries / Procedures:  9/29: Decompressive colonoscopy 10/3: Decompressive colonoscopy  Central access: DL PICC placed 10/4 TPN start date: 10/4  Nutritional Goals: Goal TPN rate is 60 mL/hr (provides101 g of protein and 1938 kcals per day)  RD Assessment: Estimated Needs Total Energy Estimated Needs: 1900-2200kcal/day Total Protein Estimated Needs: 95-110g/day Total Fluid Estimated Needs: 1.9-2.2L/day  Current Nutrition:  NPO  Plan:  --Per discussion with RD, attempting to concentrate TPN d/t fluid restriction (hx of HF) --Continue TPN at 60 mL/hr (goal rate) Goal rate TPN will provide: Protein: 101 g (70 g/L of Clinisol 15%) Dextrose: 346 g (24%) Lipids: 36 g (36 g/L) Kcals / day: 1938 Fluids: 1.4 L --Electrolytes in TPN: Na 338mq/L, K 526m/L, Ca 38m48mL, Mg 38mE33m, and Phos 138mm76m. Cl:Ac 1:1 --Add standard MVI and trace elements to TPN and thiamine 100 mg x 3 days (completed) --Continue Moderate q6h SSI and adjust as needed  --Monitor TPN labs on Mon/Thurs  Katasha Riga B Hilberto Burzynski 06/11/2022,9:15 AM

## 2022-06-11 NOTE — Plan of Care (Signed)
  Problem: Clinical Measurements: Goal: Will remain free from infection Outcome: Progressing Goal: Respiratory complications will improve Outcome: Progressing Goal: Cardiovascular complication will be avoided Outcome: Progressing   Problem: Education: Goal: Knowledge of secondary prevention will improve ([facility]) Outcome: Progressing

## 2022-06-12 ENCOUNTER — Inpatient Hospital Stay: Payer: Medicare Other

## 2022-06-12 ENCOUNTER — Encounter: Payer: Self-pay | Admitting: Internal Medicine

## 2022-06-12 DIAGNOSIS — J690 Pneumonitis due to inhalation of food and vomit: Secondary | ICD-10-CM | POA: Diagnosis not present

## 2022-06-12 DIAGNOSIS — R109 Unspecified abdominal pain: Secondary | ICD-10-CM | POA: Diagnosis not present

## 2022-06-12 DIAGNOSIS — K5981 Ogilvie syndrome: Secondary | ICD-10-CM | POA: Diagnosis not present

## 2022-06-12 LAB — LIPASE, BLOOD: Lipase: 42 U/L (ref 11–51)

## 2022-06-12 LAB — COMPREHENSIVE METABOLIC PANEL
ALT: 57 U/L — ABNORMAL HIGH (ref 0–44)
AST: 33 U/L (ref 15–41)
Albumin: 3.5 g/dL (ref 3.5–5.0)
Alkaline Phosphatase: 46 U/L (ref 38–126)
Anion gap: 5 (ref 5–15)
BUN: 32 mg/dL — ABNORMAL HIGH (ref 8–23)
CO2: 22 mmol/L (ref 22–32)
Calcium: 9.3 mg/dL (ref 8.9–10.3)
Chloride: 115 mmol/L — ABNORMAL HIGH (ref 98–111)
Creatinine, Ser: 1.14 mg/dL (ref 0.61–1.24)
GFR, Estimated: >60 mL/min (ref >60)
Glucose, Bld: 126 mg/dL — ABNORMAL HIGH (ref 70–99)
Potassium: 4.6 mmol/L (ref 3.5–5.1)
Sodium: 142 mmol/L (ref 135–145)
Total Bilirubin: 0.5 mg/dL (ref 0.3–1.2)
Total Protein: 6.8 g/dL (ref 6.5–8.1)

## 2022-06-12 LAB — CBC
HCT: 45 % (ref 39.0–52.0)
HCT: 42.8 % (ref 39.0–52.0)
Hemoglobin: 12.3 g/dL — ABNORMAL LOW (ref 13.0–17.0)
Hemoglobin: 13.2 g/dL (ref 13.0–17.0)
MCH: 31.4 pg (ref 26.0–34.0)
MCH: 30.1 pg (ref 26.0–34.0)
MCHC: 27.3 g/dL — ABNORMAL LOW (ref 30.0–36.0)
MCHC: 30.8 g/dL (ref 30.0–36.0)
MCV: 114.8 fL — ABNORMAL HIGH (ref 80.0–100.0)
MCV: 97.7 fL (ref 80.0–100.0)
Platelets: 264 10*3/uL (ref 150–400)
Platelets: 297 10*3/uL (ref 150–400)
RBC: 3.92 MIL/uL — ABNORMAL LOW (ref 4.22–5.81)
RBC: 4.38 MIL/uL (ref 4.22–5.81)
RDW: 13.1 % (ref 11.5–15.5)
RDW: 12.7 % (ref 11.5–15.5)
WBC: 6.7 10*3/uL (ref 4.0–10.5)
WBC: 7.2 10*3/uL (ref 4.0–10.5)
nRBC: 0 % (ref 0.0–0.2)
nRBC: 0 % (ref 0.0–0.2)

## 2022-06-12 LAB — GLUCOSE, CAPILLARY
Glucose-Capillary: 114 mg/dL — ABNORMAL HIGH (ref 70–99)
Glucose-Capillary: 124 mg/dL — ABNORMAL HIGH (ref 70–99)

## 2022-06-12 LAB — MAGNESIUM: Magnesium: 1.9 mg/dL (ref 1.7–2.4)

## 2022-06-12 LAB — PHOSPHORUS: Phosphorus: 4.8 mg/dL — ABNORMAL HIGH (ref 2.5–4.6)

## 2022-06-12 LAB — TRIGLYCERIDES: Triglycerides: 56 mg/dL (ref <150)

## 2022-06-12 MED ORDER — PANTOPRAZOLE SODIUM 40 MG IV SOLR
40.0000 mg | Freq: Two times a day (BID) | INTRAVENOUS | Status: DC
  Administered 2022-06-12 – 2022-06-23 (×22): 40 mg via INTRAVENOUS
  Filled 2022-06-12 (×22): qty 10

## 2022-06-12 MED ORDER — TRACE MINERALS CU-MN-SE-ZN 300-55-60-3000 MCG/ML IV SOLN
INTRAVENOUS | Status: AC
  Filled 2022-06-12: qty 672

## 2022-06-12 MED ORDER — HYDROMORPHONE HCL 1 MG/ML IJ SOLN
0.5000 mg | INTRAMUSCULAR | Status: DC | PRN
  Administered 2022-06-12: 0.5 mg via INTRAVENOUS
  Filled 2022-06-12: qty 0.5

## 2022-06-12 MED ORDER — SODIUM CHLORIDE 0.9 % IV BOLUS
1000.0000 mL | Freq: Once | INTRAVENOUS | Status: AC
  Administered 2022-06-12: 1000 mL via INTRAVENOUS

## 2022-06-12 MED ORDER — IOHEXOL 300 MG/ML  SOLN
100.0000 mL | Freq: Once | INTRAMUSCULAR | Status: AC | PRN
  Administered 2022-06-12: 100 mL via INTRAVENOUS

## 2022-06-12 NOTE — Progress Notes (Signed)
Palliative:  Chart review completed.  [name] is awaiting CT abdomen and then considering replacing decompressive rectal tube to low intermittent suction.  It is noted that he has not tolerated tube feeding, and there seems to be a consensus that a PEG tube does not change what is going on with his colon and is not anticipated to be a meaningful intervention.  GI is working diligently to assist [name].  They are recommending aggressive PT//OT, getting out of bed, but at times [name] will decline to participate or get out of bed.  PMT to follow, awaiting abdomen results.   Conference with attending, bedside nursing staff, RD, related to patient condition, needs, goals of care, disposition.  Plan: At this point continue to treat the treatable, time for outcomes.  No charge   [name], NP Palliative medicine team Team phone [phone] Greater than 50% of this time was spent counseling and coordinating care related to the above assessment and plan.

## 2022-06-12 NOTE — Progress Notes (Signed)
OT Cancellation Note  Patient Details Name: Calvin [name] MRN: [id number] DOB: 1950-09-11   Cancelled Treatment:    Reason Eval/Treat Not Completed: Patient declined, no reason specified;Fatigue/lethargy limiting ability to participate. Chart reviewed, upon arrival RN completing bedside care. Pt reports fatigue, requests to hold this date. Per chart family meeting scheduled for tmrw. Will continue to follow POC as appropriate.   Dessie Coma, M.S. OTR/L  06/12/22, 4:16 PM  ascom [phone]

## 2022-06-12 NOTE — Progress Notes (Signed)
Physical Therapy Treatment Patient Details Name: Calvin [name] MRN: [id number] DOB: Oct 28, 1950 Today's Date: 06/12/2022   History of Present Illness Calvin [name] is a 30yoM who comes to [facility] on 05/28/22 from facility SBO. PMH: ileus, HTN, HLD, COPD, multifocal CVA c Left hemiplegia and most recent MRI showing "remote infarcts in the right occipital, lateral frontal cortex and in the left cerebellum," GERD, gout, depression, GAD, seizure, CAD, MI, ETOH abuse, CKD3a, dCHF, colon CA, thyroid CA, chronic vertebral artery occlusion, cervical spondylosis with most recent MRI showing "Multilevel cervical spondylosis, most pronounced at the C3-4 level where there is moderate-to-severe canal stenosis and severe  bilateral foraminal stenosis. Findings slightly progressed from  prior MRI."    PT Comments    Per nursing request session limited to supine therex to pt's tolerance secondary to recent 10/10 abdominal pain and pt awaiting imminent transport for imaging.  Pt put forth good effort with below therex and although could not give a pain number he did say that his abdomen was feeling better.  No adverse symptoms noted during the session.  Pt will benefit from PT services in a SNF setting upon discharge to safely address deficits listed in patient problem list for decreased caregiver assistance and eventual return to PLOF.     Recommendations for follow up therapy are one component of a multi-disciplinary discharge planning process, led by the attending physician.  Recommendations may be updated based on patient status, additional functional criteria and insurance authorization.  Follow Up Recommendations  Skilled nursing-short term rehab (<3 hours/day) Can patient physically be transported by private vehicle: No   Assistance Recommended at Discharge Frequent or constant Supervision/Assistance  Patient can return home with the following Assistance with cooking/housework;Direct supervision/assist for  financial management;Assist for transportation;Direct supervision/assist for medications management;A lot of help with bathing/dressing/bathroom;Two people to help with walking and/or transfers   Equipment Recommendations  None recommended by PT    Recommendations for Other Services       Precautions / Restrictions Precautions Precautions: Fall Precaution Comments: Seizure precautions Restrictions Weight Bearing Restrictions: No     Mobility  Bed Mobility               General bed mobility comments: Mobility deferred per nursing request secondary to pain and pt preparing for transport to imaging    Transfers                        Ambulation/Gait                   Stairs             Wheelchair Mobility    Modified Rankin (Stroke Patients Only)       Balance                                            Cognition Arousal/Alertness: Awake/alert Behavior During Therapy: Flat affect Overall Cognitive Status: No family/caregiver present to determine baseline cognitive functioning                                 General Comments: appropriate, follows simple commands        Exercises Total Joint Exercises Ankle Circles/Pumps: Strengthening, Both, 5 reps, 10 reps (with manual resistance) Quad Sets: Strengthening, Both, 5 reps,  10 reps Short Arc Quad: Strengthening, Both, 10 reps Heel Slides: Strengthening, Both, 10 reps (with manual resistance during knee extension phase) Hip ABduction/ADduction: AAROM, Strengthening, Both, 10 reps Straight Leg Raises: AAROM, Strengthening, Both, 10 reps    General Comments        Pertinent Vitals/Pain Pain Assessment Pain Assessment: PAINAD Breathing: normal Negative Vocalization: none Facial Expression: smiling or inexpressive Body Language: relaxed Consolability: no need to console PAINAD Score: 0 Pain Location: abdomen Pain Descriptors / Indicators: Sore,  Aching Pain Intervention(s): Repositioned, Premedicated before session, Monitored during session    Home Living                          Prior Function            PT Goals (current goals can now be found in the care plan section) Progress towards PT goals: PT to reassess next treatment    Frequency    Min 2X/week      PT Plan Current plan remains appropriate    Co-evaluation              AM-PAC PT "6 Clicks" Mobility   Outcome Measure  Help needed turning from your back to your side while in a flat bed without using bedrails?: Total Help needed moving from lying on your back to sitting on the side of a flat bed without using bedrails?: Total Help needed moving to and from a bed to a chair (including a wheelchair)?: Total Help needed standing up from a chair using your arms (e.g., wheelchair or bedside chair)?: Total Help needed to walk in hospital room?: Total Help needed climbing 3-5 steps with a railing? : Total 6 Click Score: 6    End of Session Equipment Utilized During Treatment: Gait belt Activity Tolerance: Patient tolerated treatment well Patient left: in bed;with call bell/phone within reach;with bed alarm set Nurse Communication: Mobility status PT Visit Diagnosis: Muscle weakness (generalized) (M62.81);Difficulty in walking, not elsewhere classified (R26.2)     Time: 9604-5409 PT Time Calculation (min) (ACUTE ONLY): 14 min  Charges:  $Therapeutic Exercise: 8-22 mins                     D. Scott Shawne Eskelson PT, DPT 06/12/22, 12:15 PM

## 2022-06-12 NOTE — TOC Progression Note (Signed)
Transition of Care [facility]) - Progression Note    Patient Details  Name: SERJIO DEUPREE MRN: [id number] Date of Birth: 1950-12-17  Transition of Care [facility]) CM/SW Contact  Beverly Sessions, RN Phone Number: 06/12/2022, 3:23 PM  Clinical Narrative:     MD requested family meeting to be arranged.  Daughter states that she will be at bedside tomorrow between 63 and 10, but that she does not anticipate her other siblings to attend. MD updated   Expected Discharge Plan: [place] Barriers to Discharge: Continued Medical Work up  Expected Discharge Plan and Services Expected Discharge Plan: [place]   Discharge Planning Services: CM Consult Post Acute Care Choice: Resumption of Svcs/PTA Provider, [place] Living arrangements for the past 2 months: [place]                 DME Arranged: N/A DME Agency: NA       HH Arranged: NA HH Agency: NA         Social Determinants of Health (SDOH) Interventions    Readmission Risk Interventions    06/02/2022   12:02 PM 10/17/2021   10:47 AM  Readmission Risk Prevention Plan  Transportation Screening Complete Complete  PCP or Specialist Appt within 3-5 Days  Complete  Social Work Consult for [place] Planning/Counseling  Complete  Palliative Care Screening  Not Applicable  Medication Review [employer]) Complete Complete  PCP or Specialist appointment within 3-5 days of discharge Complete   HRI or [place] Complete   SW Recovery Care/Counseling Consult Complete   Palliative Care Screening Complete   Skilled Nursing Facility Complete

## 2022-06-12 NOTE — Consult Note (Signed)
PHARMACY - TOTAL PARENTERAL NUTRITION CONSULT NOTE   Indication:  Intolerance to enteral feeding  Patient Measurements: Height: '5\' 11"'$  (180.3 cm) Weight: 97.1 kg (214 lb 1.1 oz) IBW/kg (Calculated) : 75.3 TPN AdjBW (KG): 80.5 Body mass index is 29.86 kg/m.  Assessment:  Patient is a 71 y/o M with medical history including Ogilvie's syndrome, HTN, HLD, COPD, CVA, GERD, gout, depression / anxiety, seizures, CAD, EtOH use disorder, CKD, diastolic CHF, colon cancer, thyroid cancer who is admitted with resolved SBO but now with worsening pseudoobstruction. GI consulted. Patient failed to respond to conservative measures with methylnaltrexone and neostigmine x 2. Patient underwent decompressive colonoscopy on 5/88 complicated by colonic distension post-procedurally. Patient underwent repeat decompressive colonoscopy on 10/3. Patient had been on tube feeds at trickle rate. Pharmacy consulted to initiate TPN to help meet nutritional goals given acute abdominal issues and intolerance to enteral nutrition.  Glucose / Insulin: Pre-diabetic range Hgb A1c (5.8% on 05/28/22). Last 24 hrs: 4 units insulin. Electrolytes: Phos: 4.8, K = trending upward (10/6: 4.2  >> 10/9: 4.6) Renal: Stable CKD, Scr ~ 1 Hepatic: Within normal limits Intake / Output; MIVF: +6.9L for the admission. No MIVF running GI Imaging: 9/24 CT abdomen / pelvis: SBO and chronic colonic ileus or pseudo-obstruction 9/26 Abdominal X-ray: Air-filled distention of large and small bowel loops favoring ileus 9/27 CT abdomen / pelvis: No evidence of SBO 9/29 Abdominal X-ray: Severe dilatation of the colon as can be seen with pseudo-obstruction/colonic ileus 9/30 Abdominal X-ray: Significant interval improvement in colonic distension, which has mostly resolved 10/1 Abdominal X-ray: Persistent gaseous dilation of the colon, though mildly improved compared to 06/02/2022 10/2 Abdominal X-ray: Worsened gaseous distention of the colon 10/3  Interval placement of colonic decompression tube with decreased colonic gaseous distention. 10/9 Gaseous distention of transverse and proximal descending colon with interval worsening. GI Surgeries / Procedures:  9/29: Decompressive colonoscopy 10/3: Decompressive colonoscopy  Central access: DL PICC placed 10/4 TPN start date: 10/4  Nutritional Goals: Goal TPN rate is 60 mL/hr (provides101 g of protein and 1938 kcals per day)  RD Assessment: Estimated Needs Total Energy Estimated Needs: 1900-2200kcal/day Total Protein Estimated Needs: 95-110g/day Total Fluid Estimated Needs: 1.9-2.2L/day  Current Nutrition:  NPO  Plan:  --Per discussion with RD, attempting to concentrate TPN d/t fluid restriction (hx of HF) --Continue TPN at 60 mL/hr (goal rate) Goal rate TPN will provide: Protein: 101 g (70 g/L of Clinisol 15%) Dextrose: 346 g (24%) Lipids: 36 g (36 g/L) Kcals / day: 1938 Fluids: 1.4 L --Electrolytes in TPN: Na 78mq/L, K 464m/L, Ca 49m80mL, Mg 49mE43m, and Phos 12mm27m. Cl:Ac 1:1 --Add standard MVI and trace elements to TPN and thiamine 100 mg x 3 days (completed) --Continue Moderate q6h SSI and adjust as needed  --Monitor TPN labs on Mon/Thurs --Decrease Phos and K --Recheck labs 10/10  HowarLorin Picket/2023,7:47 AM

## 2022-06-12 NOTE — Progress Notes (Signed)
MD Roosevelt Locks made aware of patient's 10/10 epigastric abdominal pain. MD to come to bedside.

## 2022-06-12 NOTE — Progress Notes (Signed)
Progress Note   Patient: Calvin [name] FXG:[id number] DOB: 1951/02/21 DOA: 05/28/2022     15 DOS: the patient was seen and examined on 06/12/2022   Brief hospital course:  YAVIER [name] is a 71 y.o. male with medical history significant of Ogilvie's syndrome, ileus, hypertension, hyperlipidemia, COPD, stroke with mild left-sided weakness, GERD, gout, depression with anxiety, seizure, CAD, MI,  alcohol abuse, CKD stage IIIa, dCHF, colon cancer, thyroid cancer, who presents from his facility with Nausea, vomiting, abdominal distention, abdominal pain for 2 days. Found to have new small bowel obstruction on CT abdomen with transition point in the right lower abdomen suspicious for adhesion. General surgery was consulted and NG tube was placed.  Patient also developed left-sided tingling and numbness and facial droop while in ED. CT head and MRI brain was negative for any acute intracranial abnormality and do reflect a chronic changes. 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  MRI of the cervical spine showed C3-C4 moderate to severe spinal stenosis, seen by neurosurgery, recommended outpatient follow-up.  9/25: Continued to have significant colonic distention.  NG suction continued. The patient was transferred to the stepdown unit for neostigmine dosing on 05/31/2022 and 06/01/2022.   Patient had a colonoscopy decompression on 9/29 and 10/3, followed with rectal tube. Tube feeding started on 10/30-10/3.  10/4.  Modified barium swallow showed a significant risk of aspiration.  TPN started after PICC line placed.  Assessment and Plan: Acute upper abdominal pain. Patient had a sudden onset of upper abdominal pain about 10 minutes ago, localized in the upper stomach.  Abdomen is bloated, with reduced breathing sounds. We will perform stat CT scan abdomen/pelvis with IV contrast, also check lipase and CBC.  Start IV Protonix twice a day, given Dilaudid as needed. Differential including  bowel perforation versus acute pancreatitis caused by TPN.  Or patient could have acute gastritis. We will follow-up on results.  Ogilvie's syndrome Gastroenterology gave neostigmine infusion on 05/31/2022 and on 06/01/22.  Decompressive colonoscopy procedure x2.  Currently patient has a rectal tube. Rectal tube clamped 10/6, patient had a bowel movement on the same day.  Rectal tube removed on 10/7. Patient had another bowel movement yesterday after giving Dulcolax suppository.     Aspiration pneumonia ([place]) Respiratory distress Dysphagia with a high risk of aspiration. History of stroke. Due to high risk of aspiration, patient is a placed on n.p.o. Discussed with general surgery and GI, patient is not ready for tube feeding due to significant abdominal distention.  Decisions made for TPN, PICC line has been placed.    Met patient brother and family last night, daughter does not live in this area.  Apparently, patient had problems with swallowing in the nursing home, dysphagia appears to be chronic. Also discussed with speech therapist, given severity of patient dysphagia and a very high risk of aspiration, unlikely to recover in the near term.  As result, oral intake is not recommended.  We will consider PEG tube when patient condition is more stable. We will continue TPN.   Paroxysmal atrial flutter (HCC) Continue Lovenox.   Cervical spinal stenosis MRI showing multilevel cervical spondylosis most pronounced at C3-C4 where moderate to severe canal stenosis and severe bilateral foraminal stenosis.  Patient is not a great surgical candidate at this point.    Chronic diastolic CHF (congestive heart failure) (HCC) -Last EF 50% No CHF exacerbation. Still stable, no evidence of volume overload.   Seizures ([place]) Seizure -IV Keppra and Depakote.  HTN (hypertension) Currently n.p.o., continue IV metoprolol as needed.   CAD (coronary artery disease) Stable.   HLD  (hyperlipidemia) Hold Zocor   Chronic kidney disease, stage 3a (HCC) Stable.   COPD (chronic obstructive pulmonary disease) (HCC) Continue nebulizers   Coffee ground emesis No recurrence.   Tobacco abuse -nicotine patch   Depression with anxiety Hold off blood pressure medicine as patient currently n.p.o.   Neuropathy Hold Neurontin as patient is n.p.o.         Subjective:  Patient had a sudden onset of upper abdominal pain started about 10 minutes ago.  Localized in upper stomach, 10/10 in severity, seem to be intermittent.  Patient was nauseous, vomited x1 time with bile.  No fever or chills. Patient had a bowel movement yesterday. No shortness of breath or hypoxia.  Physical Exam: Vitals:   06/11/22 2002 06/11/22 2039 06/12/22 0352 06/12/22 0812  BP: 125/77  117/84 124/72  Pulse: 89  86 75  Resp: '18  18 19  ' Temp: 98.2 F (36.8 C)  97.7 F (36.5 C) 98.1 F (36.7 C)  TempSrc:   Oral Oral  SpO2: 100% 97% 99% 95%  Weight:      Height:       General exam: Appears uncomfortable due to pain. Respiratory system: Clear to auscultation. Respiratory effort normal. Cardiovascular system: S1 & S2 heard, RRR. No JVD, murmurs, rubs, gallops or clicks. No pedal edema. Gastrointestinal system: Abdomen is distended, soft and upper abdominal tender.  No rebound, but bowel sounds significantly decreased. Central nervous system: Alert and oriented x2. No focal neurological deficits. Extremities: Symmetric 5 x 5 power. Skin: No rashes, lesions or ulcers   Data Reviewed:  Reviewed lab results.  Family Communication: Sister and sister-in-law updated at bedside today.  Disposition: Status is: Inpatient Remains inpatient appropriate because: Severity of disease, IV treatment.  Planned Discharge Destination:  TBD    Time spent: 55 minutes  Author: Sharen Hones, MD 06/12/2022 11:19 AM  For on call review [email or web address].

## 2022-06-12 NOTE — Progress Notes (Signed)
GI Inpatient Follow-up Note  Subjective:  Patient seen and no acute changes. On chart review it appears his colonic distension worsened after decompression tube was clamped but CT is pending. He did have a bowel movement documented.  Scheduled Inpatient Medications:   bisacodyl  10 mg Rectal Daily   budesonide (PULMICORT) nebulizer solution  0.5 mg Nebulization BID   Chlorhexidine Gluconate Cloth  6 each Topical Daily   ciprofloxacin  1 drop Both Eyes Q4H while awake   insulin aspart  0-9 Units Subcutaneous Q6H   lidocaine HCl (PF)  10 mL Other Once   LORazepam  1 mg Intravenous QHS   nicotine  21 mg Transdermal Daily   pantoprazole (PROTONIX) IV  40 mg Intravenous Q12H   polyethylene glycol  17 g Per Tube BID   sodium chloride flush  10-40 mL Intracatheter O37C   trolamine salicylate   Topical BID    Continuous Inpatient Infusions:    sodium chloride 10 mL/hr at 06/12/22 0239   levETIRAcetam 1,000 mg (06/12/22 0911)   sodium chloride     TPN ADULT (ION) 60 mL/hr at 06/12/22 0239   TPN ADULT (ION)     valproate sodium 250 mg (06/12/22 1156)    PRN Inpatient Medications:  sodium chloride, albuterol, bisacodyl, hydrALAZINE, iohexol, lidocaine, LORazepam, LORazepam, metoprolol tartrate, nitroGLYCERIN, ondansetron (ZOFRAN) IV, phenol, polyvinyl alcohol, senna-docusate, sodium chloride flush, trolamine salicylate  Review of Systems:  Review of Systems  Constitutional:  Negative for chills and fever.  Respiratory:  Negative for shortness of breath.   Cardiovascular:  Negative for chest pain.  Gastrointestinal:  Negative for abdominal pain, blood in stool and melena.  Musculoskeletal:  Positive for joint pain.  Skin:  Negative for itching and rash.  Neurological:  Positive for weakness.  Psychiatric/Behavioral:  Negative for substance abuse.   All other systems reviewed and are negative.     Physical Examination: BP 124/72 (BP Location: Left Arm)   Pulse 75   Temp 98.1 F  (36.7 C) (Oral)   Resp 19   Ht '5\' 11"'$  (1.803 m)   Wt 97.1 kg   SpO2 95%   BMI 29.86 kg/m  Gen: NAD, alert and oriented x 4 HEENT: PEERLA, EOMI, Neck: supple, no JVD or thyromegaly Chest: No respiratory distress Abd: soft, but slightly more distended than Friday Ext: no edema, well perfused with 2+ pulses, Skin: no rash or lesions noted Lymph: no LAD  Data: Lab Results  Component Value Date   WBC 7.2 06/12/2022   HGB 13.2 06/12/2022   HCT 42.8 06/12/2022   MCV 97.7 06/12/2022   PLT 297 06/12/2022   Recent Labs  Lab 06/08/22 0455 06/11/22 0441 06/12/22 1135  HGB 12.6* 12.3* 13.2   Lab Results  Component Value Date   NA 142 06/12/2022   K 4.6 06/12/2022   CL 115 (H) 06/12/2022   CO2 22 06/12/2022   BUN 32 (H) 06/12/2022   CREATININE 1.14 06/12/2022   Lab Results  Component Value Date   ALT 57 (H) 06/12/2022   AST 33 06/12/2022   ALKPHOS 46 06/12/2022   BILITOT 0.5 06/12/2022   No results for input(s): "APTT", "INR", "PTT" in the last 168 hours. Assessment/Plan: Mr. Enochs is a 71 y.o. gentleman with history of chronic ogilvie's syndrome, COPD, hypertension, hyperlipidemia, and multiple other medical comorbidities here with resolved SBO and worsening pseudoobstruction we have been consulted for ogilvie's syndrome. Underwent decompressive colonoscopy on 9/29 but not surprisingly, colonic distension has recurred which can  happen 40% of the time after decompressive colonoscopy. Underwent repeat decompressive colonoscopy on 10/3 with decompression tube left in place  Recommendations:  - will await CT abdomen and then will consider placing tube back to low-intermittent suction. It needs to be flushed with normal saline every 6 hours - continue TPN, a PEG tube does not change what is going on in his colon so is likely not going to be helpful - avoid all narcotics, I dc'ed the dilaudid, please do not order any more as this just makes the situation worse. - replete  electrolytes aggressively - can give miralax through the decompression tube - aggressive PT/OT, out of bed to the chair - in terms of end goal, it seems as if he is failing conservative management and he is high risk for surgery. Would suggest continued conversations with surgery, palliative care, and family as he now has failed IV methylnaltrexone, neostigmine x 2, and two decompressive colonoscopies. He does have decompression tube in place but this is only meant as a temporary solution.   Please call with any questions or concerns.   Raylene Miyamoto MD, MPH [facility]

## 2022-06-13 DIAGNOSIS — J690 Pneumonitis due to inhalation of food and vomit: Secondary | ICD-10-CM | POA: Diagnosis not present

## 2022-06-13 DIAGNOSIS — I5032 Chronic diastolic (congestive) heart failure: Secondary | ICD-10-CM | POA: Diagnosis not present

## 2022-06-13 DIAGNOSIS — K5981 Ogilvie syndrome: Secondary | ICD-10-CM

## 2022-06-13 LAB — GLUCOSE, CAPILLARY
Glucose-Capillary: 122 mg/dL — ABNORMAL HIGH (ref 70–99)
Glucose-Capillary: 123 mg/dL — ABNORMAL HIGH (ref 70–99)
Glucose-Capillary: 128 mg/dL — ABNORMAL HIGH (ref 70–99)
Glucose-Capillary: 137 mg/dL — ABNORMAL HIGH (ref 70–99)
Glucose-Capillary: 139 mg/dL — ABNORMAL HIGH (ref 70–99)

## 2022-06-13 LAB — BASIC METABOLIC PANEL
Anion gap: 8 (ref 5–15)
Anion gap: 5 (ref 5–15)
BUN: 29 mg/dL — ABNORMAL HIGH (ref 8–23)
BUN: 28 mg/dL — ABNORMAL HIGH (ref 8–23)
CO2: 19 mmol/L — ABNORMAL LOW (ref 22–32)
CO2: 24 mmol/L (ref 22–32)
Calcium: 8.2 mg/dL — ABNORMAL LOW (ref 8.9–10.3)
Calcium: 9.3 mg/dL (ref 8.9–10.3)
Chloride: 113 mmol/L — ABNORMAL HIGH (ref 98–111)
Chloride: 114 mmol/L — ABNORMAL HIGH (ref 98–111)
Creatinine, Ser: 1.24 mg/dL (ref 0.61–1.24)
Creatinine, Ser: 1.1 mg/dL (ref 0.61–1.24)
GFR, Estimated: >60 mL/min (ref >60)
GFR, Estimated: >60 mL/min (ref >60)
Glucose, Bld: 143 mg/dL — ABNORMAL HIGH (ref 70–99)
Glucose, Bld: 106 mg/dL — ABNORMAL HIGH (ref 70–99)
Potassium: 7.5 mmol/L (ref 3.5–5.1)
Potassium: 4.7 mmol/L (ref 3.5–5.1)
Sodium: 140 mmol/L (ref 135–145)
Sodium: 143 mmol/L (ref 135–145)

## 2022-06-13 LAB — CBC
HCT: 42.5 % (ref 39.0–52.0)
Hemoglobin: 11.6 g/dL — ABNORMAL LOW (ref 13.0–17.0)
MCH: 31.7 pg (ref 26.0–34.0)
MCHC: 27.3 g/dL — ABNORMAL LOW (ref 30.0–36.0)
MCV: 116.1 fL — ABNORMAL HIGH (ref 80.0–100.0)
Platelets: 264 10*3/uL (ref 150–400)
RBC: 3.66 MIL/uL — ABNORMAL LOW (ref 4.22–5.81)
RDW: 13.2 % (ref 11.5–15.5)
WBC: 5.9 10*3/uL (ref 4.0–10.5)
nRBC: 0 % (ref 0.0–0.2)

## 2022-06-13 LAB — POTASSIUM: Potassium: 5.2 mmol/L — ABNORMAL HIGH (ref 3.5–5.1)

## 2022-06-13 LAB — PHOSPHORUS: Phosphorus: 7.1 mg/dL — ABNORMAL HIGH (ref 2.5–4.6)

## 2022-06-13 LAB — MAGNESIUM: Magnesium: 2.4 mg/dL (ref 1.7–2.4)

## 2022-06-13 MED ORDER — SODIUM BICARBONATE 8.4 % IV SOLN
25.0000 meq | Freq: Once | INTRAVENOUS | Status: AC
  Administered 2022-06-13: 25 meq via INTRAVENOUS
  Filled 2022-06-13: qty 50

## 2022-06-13 MED ORDER — TRACE MINERALS CU-MN-SE-ZN 300-55-60-3000 MCG/ML IV SOLN
INTRAVENOUS | Status: AC
  Filled 2022-06-13: qty 672

## 2022-06-13 MED ORDER — ACETAMINOPHEN 10 MG/ML IV SOLN
1000.0000 mg | Freq: Four times a day (QID) | INTRAVENOUS | Status: AC | PRN
  Administered 2022-06-13 – 2022-06-14 (×2): 1000 mg via INTRAVENOUS
  Filled 2022-06-13 (×2): qty 100

## 2022-06-13 NOTE — Progress Notes (Signed)
Potassium 7.2, repeat 5.2.  Initial lab was a lab error.  We will give a dose of bicarb as patient has mild metabolic acidosis.

## 2022-06-13 NOTE — Consult Note (Signed)
PHARMACY - TOTAL PARENTERAL NUTRITION CONSULT NOTE   Indication:  Intolerance to enteral feeding  Patient Measurements: Height: '5\' 11"'$  (180.3 cm) Weight: 91.8 kg (202 lb 6.4 oz) IBW/kg (Calculated) : 75.3 TPN AdjBW (KG): 80.5 Body mass index is 28.23 kg/m.  Assessment:  Patient is a 71 y/o M with medical history including Ogilvie's syndrome, HTN, HLD, COPD, CVA, GERD, gout, depression / anxiety, seizures, CAD, EtOH use disorder, CKD, diastolic CHF, colon cancer, thyroid cancer who is admitted with resolved SBO but now with worsening pseudoobstruction. GI consulted. Patient failed to respond to conservative measures with methylnaltrexone and neostigmine x 2. Patient underwent decompressive colonoscopy on 7/98 complicated by colonic distension post-procedurally. Patient underwent repeat decompressive colonoscopy on 10/3. Patient had been on tube feeds at trickle rate. Pharmacy consulted to initiate TPN to help meet nutritional goals given acute abdominal issues and intolerance to enteral nutrition.  10/10: -Elevated K and Phos. -[facility] has been contact about possible transfer for high risk colectomy -GI unable to place PEG tube -Patient received IV contrast 10/9.  Glucose / Insulin: Pre-diabetic range Hgb A1c (5.8% on 05/28/22). Last 24 hrs: 4 units insulin. Electrolytes: Phos: 7.1 (unsure of accuracy due to lipemic sample), K: 5.2 (10/10) Renal: Scr trending upward 0.97 (10/6) >> 1.24 (10/10).  Only 12m of documented urine output today.   Hepatic: Within normal limits Intake / Output; MIVF: +6.9L for the admission. No MIVF running GI Imaging: 9/24 CT abdomen / pelvis: SBO and chronic colonic ileus or pseudo-obstruction 9/26 Abdominal X-ray: Air-filled distention of large and small bowel loops favoring ileus 9/27 CT abdomen / pelvis: No evidence of SBO 9/29 Abdominal X-ray: Severe dilatation of the colon as can be seen with pseudo-obstruction/colonic ileus 9/30 Abdominal X-ray:  Significant interval improvement in colonic distension, which has mostly resolved 10/1 Abdominal X-ray: Persistent gaseous dilation of the colon, though mildly improved compared to 06/02/2022 10/2 Abdominal X-ray: Worsened gaseous distention of the colon 10/3 Interval placement of colonic decompression tube with decreased colonic gaseous distention. 10/9 Gaseous distention of transverse and proximal descending colon with interval worsening. GI Surgeries / Procedures:  9/29: Decompressive colonoscopy 10/3: Decompressive colonoscopy  Central access: DL PICC placed 10/4 TPN start date: 10/4  Nutritional Goals: Goal TPN rate is 60 mL/hr (provides101 g of protein and 1938 kcals per day)  RD Assessment: Estimated Needs Total Energy Estimated Needs: 1900-2200kcal/day Total Protein Estimated Needs: 95-110g/day Total Fluid Estimated Needs: 1.9-2.2L/day  Current Nutrition:  NPO  Plan:  --Per discussion with RD, attempting to concentrate TPN d/t fluid restriction (hx of HF) --Continue TPN at 60 mL/hr (goal rate) Goal rate TPN will provide: Protein: 101 g (70 g/L of Clinisol 15%) Dextrose: 346 g (24%) Lipids: 36 g (36 g/L) Kcals / day: 1938 Fluids: 1.4 L --Electrolytes in TPN: Na 564m/L, Ca 29m52mL, K 10 mEq/L, Phos 6 mmol/L and Mg 3mE629m. Cl:Ac 1:2 --Add standard MVI and trace elements to TPN and thiamine 100 mg x 3 days (completed) --Continue Moderate q6h SSI and adjust as needed  --Monitor TPN labs on Mon/Thurs --Reduced potassium, phosphorus and magnesium --Recheck labs 10/11   HowaLorin Picket10/2023,11:25 AM

## 2022-06-13 NOTE — Progress Notes (Signed)
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  Yesterday a CT scan was performed and I have personally reviewed the images showing persistent dilation of the colon without evidence of ischemia no evidence of free air.  Today the patient appears more comfortable but still distended.  He complains of abdominal pain. Today it is the first time I have been able to have theshort  conversation with the patient.  The brother is at the bedside and I had also a lengthy discussion with Enid Derry his sister.  I also clarified that the patient is actually not a Jehovah's Witness.  Apparently there was another sister that  stated that he was a witness.  I have confirmed with the 2 siblings and also with the patient and in fact the patient is willing to take blood in case this is needed. On exam today he is awake and alert he follows simple commands and is oriented to place.  He is unable to make definitive decisions.  His abdomen is soft is mildly tender without peritonitis.  Actal tube in place.  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 I had a very candid and lengthy discussion with both of the siblings ( sister and brother) . Surgical options premarked include a subtotal colectomy with end ileostomy in addition to an open gastrostomy tube. Please also note that the patient is not very functional and is encephalopathic. He will need to have the ileostomy for the rest of his life and he will be fed via the open gastrostomy tube. I am not necessarily sure if this is good quality of life towards the end of his life.  More importantly it is unclear to me if the patient or the family are willing to proceed with this  massive injury.  Best case scenario he recovers well from subtotal colectomy and ileostomy but he goes to a SNF in need for artificial feeds and lifelong ostomy has their own potential needs. This will be in this best case scenario.  They also understand that he is at high risk and he can very well die in the operating room or in the perioperative period.  He can be left intubated and unable to wean given his neurological condition and further requiring tracheostomy and more invasive methods to prolong his life.  The family is going to get together.  Initially there was a thought about transferring the patient to a tertiary center but I do think that the family understands the unfortunate situation and the lack of good options. Now recommend continuation of n.p.o. with TPN given the severe risk of aspiration.  Gastroenterology has also attempted multiple decompressions and medical management without success. Recommend to have more clarity regarding end of life options and goals of care. I have spent more than 50 minutes in this encounter including personally reviewing imaging studies, coordinating his care and having an extensive family discussion

## 2022-06-13 NOTE — Anesthesia Postprocedure Evaluation (Signed)
Anesthesia Post Note  Patient: Calvin [name]  Procedure(s) Performed: COLONOSCOPY WITH PROPOFOL  Patient location during evaluation: Endoscopy Anesthesia Type: General Level of consciousness: awake and alert Pain management: pain level controlled Vital Signs Assessment: post-procedure vital signs reviewed and stable Respiratory status: spontaneous breathing, nonlabored ventilation, respiratory function stable and patient connected to nasal cannula oxygen Cardiovascular status: blood pressure returned to baseline and stable Postop Assessment: no apparent nausea or vomiting Anesthetic complications: no   No notable events documented.   Last Vitals:  Vitals:   06/12/22 2018 06/13/22 0520  BP: 128/79 (!) 141/83  Pulse: 85 85  Resp: 16 20  Temp: 36.9 C (!) 36.3 C  SpO2: 100% 99%    Last Pain:  Vitals:   06/13/22 0520  TempSrc: Oral  PainSc:                  Martha Clan

## 2022-06-13 NOTE — TOC Progression Note (Signed)
Transition of Care [facility]) - Progression Note    Patient Details  Name: Calvin [name] MRN: [id number] Date of Birth: June 21, 1951  Transition of Care [facility]) CM/SW Contact  Beverly Sessions, RN Phone Number: 06/13/2022, 3:51 PM  Clinical Narrative:    Debarah Crape at [facility] that patient is not medically ready for discharge    Expected Discharge Plan: [place] Barriers to Discharge: Continued Medical Work up  Expected Discharge Plan and Services Expected Discharge Plan: [place]   Discharge Planning Services: CM Consult Post Acute Care Choice: Resumption of Svcs/PTA Provider, [place] Living arrangements for the past 2 months: [place]                 DME Arranged: N/A DME Agency: NA       HH Arranged: NA HH Agency: NA         Social Determinants of Health (SDOH) Interventions    Readmission Risk Interventions    06/02/2022   12:02 PM 10/17/2021   10:47 AM  Readmission Risk Prevention Plan  Transportation Screening Complete Complete  PCP or Specialist Appt within 3-5 Days  Complete  Social Work Consult for [place] Planning/Counseling  Complete  Palliative Care Screening  Not Applicable  Medication Review Press photographer) Complete Complete  PCP or Specialist appointment within 3-5 days of discharge Complete   HRI or Home Care Consult Complete   SW Recovery Care/Counseling Consult Complete   Palliative Care Screening Complete   Skilled Nursing Facility Complete

## 2022-06-13 NOTE — Progress Notes (Addendum)
Progress Note   Patient: Calvin [name]:[id number] DOB: Jul 04, 1951 DOA: 05/28/2022     16 DOS: the patient was seen and examined on 06/13/2022   Brief hospital course:  Calvin [name] is a 71 y.o. male with medical history significant of Ogilvie's syndrome, ileus, hypertension, hyperlipidemia, COPD, stroke with mild left-sided weakness, GERD, gout, depression with anxiety, seizure, CAD, MI,  alcohol abuse, CKD stage IIIa, dCHF, colon cancer, thyroid cancer, who presents from his facility with Nausea, vomiting, abdominal distention, abdominal pain for 2 days. Found to have new small bowel obstruction on CT abdomen with transition point in the right lower abdomen suspicious for adhesion. General surgery was consulted and NG tube was placed.  Patient also developed left-sided tingling and numbness and facial droop while in ED. CT head and MRI brain was negative for any acute intracranial abnormality and do reflect a chronic changes. 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  MRI of the cervical spine showed C3-C4 moderate to severe spinal stenosis, seen by neurosurgery, recommended outpatient follow-up.  9/25: Continued to have significant colonic distention.  NG suction continued. The patient was transferred to the stepdown unit for neostigmine dosing on 05/31/2022 and 06/01/2022.   Patient had a colonoscopy decompression on 9/29 and 10/3, followed with rectal tube. Tube feeding started on 10/30-10/3.  10/4.  Modified barium swallow showed a significant risk of aspiration.  TPN started after PICC line placed.  10/10.  Patient currently on TPN, GI cannot place PEG tube due to worsening due to abdominal distention.  Patient will need a total colectomy in order to survive.  Discussed with [facility], they will look into this.  Assessment and Plan: Goal of care discussion. Patient has severe dysphagia with very high aspiration risk, not able to eat.  Patient also has significant  abdominal distention due to Ogilvie syndrome.  As a result, PEG tube is not an option.  The only solution is a total colectomy, which carries very high risk, general surgery cannot perform at this time. Currently, patient is on TPN, but this cannot be a long-term solution. Had a long discussion with the patient, daughter and brother.  They want a second opinion in [facility], which may be able to perform high risk total colectomy. I have called [facility] transfer center, transferred all CT images.  Spoke with Dr. Doyne Keel from [facility] general surgery, do not believe surgery is a good option.  Suggested continuing with rectal tube, as needed decompression, also add erythromycin (not given due to long QT). May contact them again in a week if not better.   Acute upper abdominal pain. Appears to be secondary to gastric spasm.  CT scan repeated did not show any acute changes.  Lipase normal.  Abdominal pain resolved after giving pain medicine.   Ogilvie's syndrome Gastroenterology gave neostigmine infusion on 05/31/2022 and on 06/01/22.  Decompressive colonoscopy procedure x2.  Currently patient has a rectal tube. Rectal tube clamped 10/6, patient had a bowel movement on the same day.   Followed by GI.  Currently has intermittent rectal tube suctioning.     Aspiration pneumonia ([place]) Respiratory distress Dysphagia with a high risk of aspiration. History of stroke. Due to high risk of aspiration, patient is a placed on n.p.o. Discussed with general surgery and GI, patient is not ready for tube feeding due to significant abdominal distention.  Decisions made for TPN, PICC line has been placed.    Continue TPN per pharmacy.   Paroxysmal  atrial flutter (HCC) Continue Lovenox.   Cervical spinal stenosis MRI showing multilevel cervical spondylosis most pronounced at C3-C4 where moderate to severe canal stenosis and severe bilateral foraminal stenosis.  Patient is not a great surgical  candidate at this point.    Chronic diastolic CHF (congestive heart failure) (HCC) -Last EF 50% No CHF exacerbation. Still stable, no evidence of volume overload.   Seizures ([place]) Seizure -IV Keppra and Depakote.   HTN (hypertension) Currently n.p.o., continue IV metoprolol as needed.   CAD (coronary artery disease) Stable.   HLD (hyperlipidemia) Hold Zocor   Chronic kidney disease, stage 3a (HCC) Stable.   COPD (chronic obstructive pulmonary disease) (HCC) Continue nebulizers   Coffee ground emesis No recurrence.   Tobacco abuse -nicotine patch   Depression with anxiety Hold off blood pressure medicine as patient currently n.p.o.   Neuropathy Hold Neurontin as patient is n.p.o. effective   Paroxysmal atrial flutter (HCC) Continue Lovenox.   Cervical spinal stenosis MRI showing multilevel cervical spondylosis most pronounced at C3-C4 where moderate to severe canal stenosis and severe bilateral foraminal stenosis.  Patient is not a great surgical candidate at this point.    Chronic diastolic CHF (congestive heart failure) (HCC) -Last EF 50% No CHF exacerbation. Still stable, no evidence of volume overload.   Seizures ([place]) Seizure -IV Keppra and Depakote.   HTN (hypertension) Currently n.p.o., continue IV metoprolol as needed.   CAD (coronary artery disease) Stable.   HLD (hyperlipidemia) Hold Zocor   Chronic kidney disease, stage 3a (HCC) Stable.   COPD (chronic obstructive pulmonary disease) (HCC) Continue nebulizers   Coffee ground emesis No recurrence.   Tobacco abuse -nicotine patch   Depression with anxiety Hold off blood pressure medicine as patient currently n.p.o.   Neuropathy Hold Neurontin as patient is n.p.o.      Subjective:  Patient does not have complaints today.  Abdominal pain since has resolved.  Physical Exam: Vitals:   06/12/22 2018 06/13/22 0500 06/13/22 0520 06/13/22 0828  BP: 128/79  (!) 141/83 118/71   Pulse: 85  85 80  Resp: '16  20 18  '$ Temp: 98.5 F (36.9 C)  (!) 97.4 F (36.3 C) 97.8 F (36.6 C)  TempSrc: Oral  Oral Oral  SpO2: 100%  99% 99%  Weight:  92 kg 91.8 kg   Height:       General exam: Appears calm and comfortable  Respiratory system: Clear to auscultation. Respiratory effort normal. Cardiovascular system: S1 & S2 heard, RRR. No JVD, murmurs, rubs, gallops or clicks. No pedal edema. Gastrointestinal system: Abdomen is mildly distended, soft and nontender. No organomegaly or masses felt. Normal bowel sounds heard. Central nervous system: Alert and oriented x2. No focal neurological deficits. Extremities: Symmetric 5 x 5 power. Skin: No rashes, lesions or ulcers Psychiatry: Mood & affect appropriate.   Data Reviewed:  CT and lab results reviewed.  Family Communication: Family meeting as above.  Disposition: Status is: Inpatient Remains inpatient appropriate because: Severity of disease, IV treatment.  Planned Discharge Destination:  TBD    Time spent: 55 minutes  Author: Sharen Hones, MD 06/13/2022 11:05 AM  For on call review [email or web address].

## 2022-06-14 DIAGNOSIS — M4802 Spinal stenosis, cervical region: Secondary | ICD-10-CM | POA: Diagnosis not present

## 2022-06-14 DIAGNOSIS — I4892 Unspecified atrial flutter: Secondary | ICD-10-CM | POA: Diagnosis not present

## 2022-06-14 DIAGNOSIS — Z515 Encounter for palliative care: Secondary | ICD-10-CM | POA: Diagnosis not present

## 2022-06-14 DIAGNOSIS — R1310 Dysphagia, unspecified: Secondary | ICD-10-CM

## 2022-06-14 DIAGNOSIS — J690 Pneumonitis due to inhalation of food and vomit: Secondary | ICD-10-CM | POA: Diagnosis not present

## 2022-06-14 DIAGNOSIS — K5981 Ogilvie syndrome: Secondary | ICD-10-CM | POA: Diagnosis not present

## 2022-06-14 LAB — GLUCOSE, CAPILLARY
Glucose-Capillary: 119 mg/dL — ABNORMAL HIGH (ref 70–99)
Glucose-Capillary: 124 mg/dL — ABNORMAL HIGH (ref 70–99)
Glucose-Capillary: 133 mg/dL — ABNORMAL HIGH (ref 70–99)
Glucose-Capillary: 133 mg/dL — ABNORMAL HIGH (ref 70–99)
Glucose-Capillary: 143 mg/dL — ABNORMAL HIGH (ref 70–99)

## 2022-06-14 LAB — BASIC METABOLIC PANEL
Anion gap: 6 (ref 5–15)
BUN: 31 mg/dL — ABNORMAL HIGH (ref 8–23)
CO2: 22 mmol/L (ref 22–32)
Calcium: 8.9 mg/dL (ref 8.9–10.3)
Chloride: 113 mmol/L — ABNORMAL HIGH (ref 98–111)
Creatinine, Ser: 1.21 mg/dL (ref 0.61–1.24)
GFR, Estimated: >60 mL/min (ref >60)
Glucose, Bld: 137 mg/dL — ABNORMAL HIGH (ref 70–99)
Potassium: 4.3 mmol/L (ref 3.5–5.1)
Sodium: 141 mmol/L (ref 135–145)

## 2022-06-14 LAB — PHOSPHORUS: Phosphorus: 3.7 mg/dL (ref 2.5–4.6)

## 2022-06-14 LAB — MAGNESIUM: Magnesium: 1.8 mg/dL (ref 1.7–2.4)

## 2022-06-14 MED ORDER — TRACE MINERALS CU-MN-SE-ZN 300-55-60-3000 MCG/ML IV SOLN
INTRAVENOUS | Status: AC
  Filled 2022-06-14: qty 672

## 2022-06-14 NOTE — Progress Notes (Signed)
Progress Note   Patient: Calvin [name]:[id number] DOB: 1951-03-21 DOA: 05/28/2022     17 DOS: the patient was seen and examined on 06/14/2022   Brief hospital course:  PATRICIO POPWELL is a 71 y.o. male with medical history significant of Ogilvie's syndrome, ileus, hypertension, hyperlipidemia, COPD, stroke with mild left-sided weakness, GERD, gout, depression with anxiety, seizure, CAD, MI,  alcohol abuse, CKD stage IIIa, dCHF, colon cancer, thyroid cancer, who presents from his facility with Nausea, vomiting, abdominal distention, abdominal pain for 2 days. Found to have new small bowel obstruction on CT abdomen with transition point in the right lower abdomen suspicious for adhesion. General surgery was consulted and NG tube was placed.  Patient also developed left-sided tingling and numbness and facial droop while in ED. CT head and MRI brain was negative for any acute intracranial abnormality and do reflect a chronic changes. 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  MRI of the cervical spine showed C3-C4 moderate to severe spinal stenosis, seen by neurosurgery, recommended outpatient follow-up.  9/25: Continued to have significant colonic distention.  NG suction continued. The patient was transferred to the stepdown unit for neostigmine dosing on 05/31/2022 and 06/01/2022.   Patient had a colonoscopy decompression on 9/29 and 10/3, followed with rectal tube. Tube feeding started on 10/30-10/3.  10/4.  Modified barium swallow showed a significant risk of aspiration.  TPN started after PICC line placed.  10/10.  Patient currently on TPN, GI cannot place PEG tube due to worsening due to abdominal distention.  Patient will need a total colectomy in order to survive.  Dr. Roosevelt Locks spoke with Dr. Doyne Keel from [facility] and he did not believe that he was a good surgical candidate and can touch back with them in a week if not improved.  Assessment and Plan: Ogilvie's  syndrome General surgery does not think this is bowel obstruction.  Gastroenterology gave neostigmine infusion on 05/31/2022 and on 06/01/22.  Decompressive colonoscopy procedure on 06/02/2022 and on 06/06/2022 and left a rectal tube in. Patient did not do well with barium swallow exam.  Patient currently on TPN.  Case discussed with palliative care.  Paroxysmal atrial flutter (HCC) Currently rate controlled.  On as needed IV metoprolol.  Lovenox discontinued by general surgery.  We will have pharmacy look into whether we can restart this or not.  Aspiration pneumonia ([place]) Completed 5 days of Zosyn on 06/05/2022.  Cervical spinal stenosis MRI showing multilevel cervical spondylosis most pronounced at C3-C4 where moderate to severe canal stenosis and severe bilateral foraminal stenosis.  Patient is not a great surgical candidate at this point.   Respiratory distress Respiratory distress on 06/01/2022 with upper airway congestion.  Improved with Decadron and racemic epinephrine infusion.  Currently not on oxygen.  Chronic diastolic CHF (congestive heart failure) (HCC) -Last EF 50% -Hold Lasix   Seizures (HCC) Seizure -Seizure precaution -When necessary Ativan for seizure -IV Keppra and Depakote.    HTN (hypertension) NG tube out and failed swallow eval.  As needed IV metoprolol.  CAD (coronary artery disease) No chest pain.  Troponin negative x2   HLD (hyperlipidemia) Hold Zocor  Chronic kidney disease, stage 3a (HCC) Last creatinine 1.21 with a GFR greater than 60  COPD (chronic obstructive pulmonary disease) (HCC) Continue nebulizers  Coffee ground emesis Last hemoglobin 11.6.  Tobacco abuse -nicotine patch  Depression with anxiety Hold home oral Xanax and Zoloft -As needed IV Ativan for anxiety  Dysphagia Did not do well with  swallow evaluation.  Neuropathy Continue gabapentin        Subjective: Patient not feeling well.  Asking for something to drink.   Did not do well on his last swallow abdominal.  Currently on TPN.  Abdomen less distended than last week.  States he is having bowel movement.  Still having output of the rectal tube.  Physical Exam: Vitals:   06/13/22 2100 06/14/22 0500 06/14/22 0910 06/14/22 1414  BP: 131/89 112/63 110/68 135/71  Pulse: 88 90 88 79  Resp: '18 18 14 16  '$ Temp: 98 F (36.7 C) 98 F (36.7 C) 98.2 F (36.8 C) (!) 97.4 F (36.3 C)  TempSrc: Oral Oral Axillary Axillary  SpO2: 97% 100% 100% 92%  Weight:  92.5 kg    Height:       Physical Exam HENT:     Head: Normocephalic.     Mouth/Throat:     Pharynx: No oropharyngeal exudate.  Eyes:     General: Lids are normal.     Conjunctiva/sclera: Conjunctivae normal.  Cardiovascular:     Rate and Rhythm: Normal rate and regular rhythm.     Heart sounds: Normal heart sounds, S1 normal and S2 normal.  Pulmonary:     Breath sounds: No decreased breath sounds, wheezing, rhonchi or rales.  Abdominal:     General: There is distension.     Palpations: Abdomen is soft.     Tenderness: There is no abdominal tenderness.  Musculoskeletal:     Right lower leg: Swelling present.     Left lower leg: Swelling present.  Skin:    General: Skin is warm.     Findings: No rash.  Neurological:     Mental Status: He is alert.     Data Reviewed: Potassium 4.3, creatinine 1.21  Family Communication: Left message for daughter  Disposition: Status is: Inpatient Remains inpatient appropriate because: Still n.p.o. because he did not do well with the swallow evaluation.  Still has rectal tube in.  Planned Discharge Destination: To be determined    Time spent: 27 minutes Case discussed with palliative care.  Author: Loletha Grayer, MD 06/14/2022 2:42 PM  For on call review [email or web address].

## 2022-06-14 NOTE — Assessment & Plan Note (Addendum)
Did not do well with last swallow evaluation.  Patient to have another swallow evaluation today or tomorrow.

## 2022-06-14 NOTE — TOC Progression Note (Signed)
Transition of Care [facility]) - Progression Note    Patient Details  Name: Calvin [name] MRN: [id number] Date of Birth: 1950-11-05  [facility] CM/SW Contact  Beverly Sessions, RN Phone Number: 06/14/2022, 11:33 AM  Clinical Narrative:      Per MD note [facility] did not feel like patient was a good surgical candidate.  Notified Gina at [facility] that patient still is not medically ready.   Expected Discharge Plan: [place] Barriers to Discharge: Continued Medical Work up  Expected Discharge Plan and Services Expected Discharge Plan: [place]   Discharge Planning Services: CM Consult Post Acute Care Choice: Resumption of Svcs/PTA Provider, [place] Living arrangements for the past 2 months: [place]                 DME Arranged: N/A DME Agency: NA       HH Arranged: NA HH Agency: NA         Social Determinants of Health (SDOH) Interventions    Readmission Risk Interventions    06/02/2022   12:02 PM 10/17/2021   10:47 AM  Readmission Risk Prevention Plan  Transportation Screening Complete Complete  PCP or Specialist Appt within 3-5 Days  Complete  Social Work Consult for [place] Planning/Counseling  Complete  Palliative Care Screening  Not Applicable  Medication Review Press photographer) Complete Complete  PCP or Specialist appointment within 3-5 days of discharge Complete   HRI or [place] Complete   SW Recovery Care/Counseling Consult Complete   Palliative Care Screening Complete   Skilled Nursing Facility Complete

## 2022-06-14 NOTE — Progress Notes (Signed)
Nutrition Follow Up Note   DOCUMENTATION CODES:   Not applicable  INTERVENTION:   Continue TPN per pharmacy- provides 101g/day protein and 1938kcal/day   Daily weights   NUTRITION DIAGNOSIS:   Inadequate oral intake related to acute illness as evidenced by other (comment) (pt on NPO/clear liquid diet since admission).  GOAL:   Patient will meet greater than or equal to 90% of their needs -met   MONITOR:   Labs, Weight trends, TF tolerance, Skin, I & O's, TPN  ASSESSMENT:   71 y.o. male with medical history significant of Ogilvie's syndrome, ileus, hypertension, hyperlipidemia, COPD, MI, stroke with mild left-sided weakness, GERD, gout, depression with anxiety, seizure, DDD, CAD, MI,  alcohol abuse, CKD stage IIIa, dCHF, colon cancer and thyroid cancer who is admitted with colonic ileus secondary to Ogilvie syndrome and possible SBO.  Pt s/p colonic decompression 9/29 & 10/3 with rectal tube placement   Pt remains on TPN a goal rate. Refeed labs stable. Triglycerides wnl. Per chart, pt remains weight stable and appears to be at his UBW. Pt remains NPO and has been unable to initiate on a oral diet r/t dysphagia. Pt with continued abdominal distension and persistent dilation of the colon. Pt continues to have abdominal pain. PEG tube has been discussed but pt previously unable to tolerate NGT feedings so PEG tube would not likely be beneficial. Could trial NGT feedings again to see if patient could tolerate gastric feeds. Pt is a poor candidate for chronic TPN r/t his CHF history. Pt seen by surgery but is at high risk for any surgical procedures. GI following but reports pt has failed medical management. Family requesting second opinion by tertiary care facility. [place] discussions are ongoing.   Medications reviewed and include: dulcolax, ciprofloxacin, insulin, nicotine, protonix, miralax  Labs reviewed: K 4.3 wnl, P 3.7 wnl, Mg 1.8 wnl Triglycerides 56- 10/9 Vitamin A- 19.4(L),  vitamin E 7.3(L), B12 993(H)- 9/26 Cbgs- 133, 124, 133 x 24 hrs  Diet Order:   Diet Order             Diet NPO time specified  Diet effective now                  EDUCATION NEEDS:   No education needs have been identified at this time  Skin:  Skin Assessment: Reviewed RN Assessment  Last BM:  10/10- type 6  Height:   Ht Readings from Last 1 Encounters:  06/02/22 '5\' 11"'  (1.803 m)    Weight:   Wt Readings from Last 1 Encounters:  06/14/22 92.5 kg    Ideal Body Weight:  78 kg  BMI:  Body mass index is 28.44 kg/m.  Estimated Nutritional Needs:   Kcal:  1900-2200kcal/day  Protein:  95-110g/day  Fluid:  1.9-2.2L/day  Koleen Distance MS, RD, LDN Please refer to [facility] for RD and/or RD on-call/weekend/after hours pager

## 2022-06-14 NOTE — Progress Notes (Signed)
Physical Therapy Treatment Patient Details Name: Calvin [name] MRN: [id number] DOB: 1951/03/15 Today's Date: 06/14/2022   History of Present Illness Calvin [name] is a 36yoM who comes to [facility] on 05/28/22 from facility SBO. PMH: ileus, HTN, HLD, COPD, multifocal CVA c Left hemiplegia and most recent MRI showing "remote infarcts in the right occipital, lateral frontal cortex and in the left cerebellum," GERD, gout, depression, GAD, seizure, CAD, MI, ETOH abuse, CKD3a, dCHF, colon CA, thyroid CA, chronic vertebral artery occlusion, cervical spondylosis with most recent MRI showing "Multilevel cervical spondylosis, most pronounced at the C3-4 level where there is moderate-to-severe canal stenosis and severe  bilateral foraminal stenosis. Findings slightly progressed from  prior MRI."    PT Comments    Pt was pleasant and motivated to participate during the session and put forth good effort throughout. Pt required physical assistance with all functional tasks as well for stability in both sitting and standing.  Pt was able to assist with all tasks with cuing for proper sequencing, however, and once in standing was able to take several effortful shuffling steps at the EOB before fatiguing and needing to return to sitting.  Pt will benefit from PT services in a SNF setting upon discharge to safely address deficits listed in patient problem list for decreased caregiver assistance and eventual return to PLOF.     Recommendations for follow up therapy are one component of a multi-disciplinary discharge planning process, led by the attending physician.  Recommendations may be updated based on patient status, additional functional criteria and insurance authorization.  Follow Up Recommendations  Skilled nursing-short term rehab (<3 hours/day) Can patient physically be transported by private vehicle: No   Assistance Recommended at Discharge Frequent or constant Supervision/Assistance  Patient can return home  with the following Assistance with cooking/housework;Direct supervision/assist for financial management;Assist for transportation;Direct supervision/assist for medications management;A lot of help with bathing/dressing/bathroom;Two people to help with walking and/or transfers   Equipment Recommendations  None recommended by PT    Recommendations for Other Services       Precautions / Restrictions Precautions Precautions: Fall Precaution Comments: Seizure precautions Restrictions Weight Bearing Restrictions: No     Mobility  Bed Mobility Overal bed mobility: Needs Assistance       Supine to sit: Max assist, +2 for physical assistance Sit to supine: Max assist, +2 for physical assistance   General bed mobility comments: Max A for BLE and trunk control    Transfers Overall transfer level: Needs assistance Equipment used: Rolling walker (2 wheels) Transfers: Sit to/from Stand Sit to Stand: From elevated surface, +2 physical assistance, Mod assist           General transfer comment: manual fixation of Lt hand on RW and mod verbal cues for sequencing; mod A to come to standing and to prevent posterior LOB    Ambulation/Gait Ambulation/Gait assistance: Mod assist Gait Distance (Feet): 1 Feet Assistive device: Rolling walker (2 wheels) Gait Pattern/deviations: Trunk flexed, Shuffle       General Gait Details: Pt able to take several small steps at the EOB with Mod A to prevent LOB   Stairs             Wheelchair Mobility    Modified Rankin (Stroke Patients Only)       Balance Overall balance assessment: Needs assistance Sitting-balance support: Bilateral upper extremity supported, Feet unsupported Sitting balance-Leahy Scale: Poor     Standing balance support: Bilateral upper extremity supported, During functional activity, Reliant on assistive  device for balance Standing balance-Leahy Scale: Poor                              Cognition  Arousal/Alertness: Awake/alert Behavior During Therapy: Flat affect Overall Cognitive Status: No family/caregiver present to determine baseline cognitive functioning                                          Exercises Total Joint Exercises Quad Sets: Strengthening, Both, 10 reps Heel Slides: Strengthening, Both, 10 reps Hip ABduction/ADduction: AAROM, Strengthening, Both, 10 reps Straight Leg Raises: AAROM, Strengthening, Both, 10 reps Other Exercises Other Exercises: Static sitting at the EOB x 5 min for postural control and improved activity tolerance Other Exercises: Static standing at EOB x 90 sec    General Comments        Pertinent Vitals/Pain Pain Assessment Pain Assessment: 0-10 Pain Score: 7  Pain Location: L hand Pain Descriptors / Indicators: Sore, Aching Pain Intervention(s): Repositioned, Premedicated before session, Monitored during session, Patient requesting pain meds-RN notified    Home Living                          Prior Function            PT Goals (current goals can now be found in the care plan section) Progress towards PT goals: Progressing toward goals    Frequency    Min 2X/week      PT Plan Current plan remains appropriate    Co-evaluation              AM-PAC PT "6 Clicks" Mobility   Outcome Measure  Help needed turning from your back to your side while in a flat bed without using bedrails?: Total Help needed moving from lying on your back to sitting on the side of a flat bed without using bedrails?: Total Help needed moving to and from a bed to a chair (including a wheelchair)?: A Lot Help needed standing up from a chair using your arms (e.g., wheelchair or bedside chair)?: A Lot Help needed to walk in hospital room?: Total Help needed climbing 3-5 steps with a railing? : Total 6 Click Score: 8    End of Session Equipment Utilized During Treatment: Gait belt Activity Tolerance: Patient tolerated  treatment well Patient left: in bed;with call bell/phone within reach;with bed alarm set;with family/visitor present Nurse Communication: Mobility status PT Visit Diagnosis: Muscle weakness (generalized) (M62.81);Difficulty in walking, not elsewhere classified (R26.2)     Time: 9379-0240 PT Time Calculation (min) (ACUTE ONLY): 23 min  Charges:  $Therapeutic Exercise: 8-22 mins $Therapeutic Activity: 8-22 mins                     D. Scott Zimere Dunlevy PT, DPT 06/14/22, 5:00 PM

## 2022-06-14 NOTE — Consult Note (Signed)
PHARMACY - TOTAL PARENTERAL NUTRITION CONSULT NOTE   Indication:  Intolerance to enteral feeding  Patient Measurements: Height: '5\' 11"'$  (180.3 cm) Weight: 92.5 kg (203 lb 14.4 oz) IBW/kg (Calculated) : 75.3 TPN AdjBW (KG): 80.5 Body mass index is 28.44 kg/m.  Assessment:  Patient is a 71 y/o M with medical history including Ogilvie's syndrome, HTN, HLD, COPD, CVA, GERD, gout, depression / anxiety, seizures, CAD, EtOH use disorder, CKD, diastolic CHF, colon cancer, thyroid cancer who is admitted with resolved SBO but now with worsening pseudoobstruction. GI consulted. Patient failed to respond to conservative measures with methylnaltrexone and neostigmine x 2. Patient underwent decompressive colonoscopy on 3/72 complicated by colonic distension post-procedurally. Patient underwent repeat decompressive colonoscopy on 10/3. Patient had been on tube feeds at trickle rate. Pharmacy consulted to initiate TPN to help meet nutritional goals given acute abdominal issues and intolerance to enteral nutrition.  10/10: -Elevated K and Phos. -GI unable to place PEG tube -Patient received IV contrast 10/9.  10/11: -K and Phos have normalized   Glucose / Insulin: Pre-diabetic range Hgb A1c (5.8% on 05/28/22). Last 24 hrs: 4 units insulin. Electrolytes: K and Phos have normalized, chloride = 113 Renal: Scr trending upward 0.97 (10/6) >> 1.24 (10/10).  Only 152m of documented urine output today.   Hepatic: Within normal limits Intake / Output; MIVF: +6.9L for the admission. No MIVF running GI Imaging: 9/24 CT abdomen / pelvis: SBO and chronic colonic ileus or pseudo-obstruction 9/26 Abdominal X-ray: Air-filled distention of large and small bowel loops favoring ileus 9/27 CT abdomen / pelvis: No evidence of SBO 9/29 Abdominal X-ray: Severe dilatation of the colon as can be seen with pseudo-obstruction/colonic ileus 9/30 Abdominal X-ray: Significant interval improvement in colonic distension, which  has mostly resolved 10/1 Abdominal X-ray: Persistent gaseous dilation of the colon, though mildly improved compared to 06/02/2022 10/2 Abdominal X-ray: Worsened gaseous distention of the colon 10/3 Interval placement of colonic decompression tube with decreased colonic gaseous distention. 10/9 Gaseous distention of transverse and proximal descending colon with interval worsening. GI Surgeries / Procedures:  9/29: Decompressive colonoscopy 10/3: Decompressive colonoscopy  Central access: DL PICC placed 10/4 TPN start date: 10/4  Nutritional Goals: Goal TPN rate is 60 mL/hr (provides101 g of protein and 1938 kcals per day)  RD Assessment: Estimated Needs Total Energy Estimated Needs: 1900-2200kcal/day Total Protein Estimated Needs: 95-110g/day Total Fluid Estimated Needs: 1.9-2.2L/day  Current Nutrition:  NPO  Plan:  --Per discussion with RD, attempting to concentrate TPN d/t fluid restriction (hx of HF) --Continue TPN at 60 mL/hr (goal rate) Goal rate TPN will provide: Protein: 101 g (70 g/L of Clinisol 15%) Dextrose: 346 g (24%) Lipids: 36 g (36 g/L) Kcals / day: 1938 Fluids: 1.4 L --Electrolytes in TPN: Na 544m/L, Ca 27m31mL, K 30 mEq/L, Phos 9 mmol/L and Mg 5 mEq/L. Cl:Ac 1:2 --Add standard MVI and trace elements to TPN and thiamine 100 mg x 3 days (completed) --Continue Moderate q6h SSI and adjust as needed  --Monitor TPN labs on Mon/Thurs --Increase potassium, phosphorous, and magnesium     HowLorin Picket/07/2022,8:36 AM

## 2022-06-14 NOTE — Progress Notes (Signed)
End of shift note:  There were not any significant events during this shift. Pt remained NPO and rectal tube in placed. TPN running at 60 ml/hr through a double lumen picc line

## 2022-06-14 NOTE — Progress Notes (Signed)
Palliative:  Calvin [name] is lying quietly in bed.  He appears acutely/chronically ill and quite frail.  He will briefly make but not keep eye contact.  His orientation has waxed and waned through his lengthy 17-day hospital stay.  I believe that he can make his basic needs known.  There is no family at bedside at this time.  Calvin [name] tells me that he would like some cold water or some ice chips.  We talk about his swallow study and n.p.o. status.  We talk about the possibility of surgery on his belly, PEG tube.  I ask if this is something that he would want.  He gives no evidence of definitive answer.  We also talk about the benefits of hospice care, focusing on comfort and dignity, "to let nature take its course".  I ask if he has known anyone who have hospice and he shakes his head and will negative.  Again, he gives no definitive answer about direction of care.  Call to daughter, Calvin [name]. We talk about Calvin [name] acute health concerns.  Calvin [name] states that although she has noted that he has had this bowel issues for many years, she feels that this is "all of a sudden".  She tells me that just a few weeks ago he was to transfer himself, and had a good quality of life in his long-term care facility.  We talk about transfer to [facility], and I shared that at this point, the [facility] that doing does not believe that he is a good surgical candidate.  We talked about watchful waiting.  I shared my worry that Calvin [name] has not show meaningful improvement after 17 days in the hospital.  Talk about CODE STATUS.  At this point, Calvin [name] states that her father has shared that he would want to continue full scope/full code.  We also talk about the benefits of hospice care.  Calvin [name] states that Calvin [name]'s mother had hospice care.  She tells me that he shared that he would like surgery, if it is offered, even if there is great risk.  We talked about time for outcomes.  Conference with attending,  bedside nursing staff, transition of care team related to patient condition, needs, goals of care and disposition  Plan: Continue full scope/full code.  Time for outcomes.  Continues to be agreeable to surgery if offered.  63 minutes  Calvin Axe, NP Palliative Medicine Team  Team Phone [phone] Greater than 50% of this time was spent counseling and coordinating care related to the above assessment and plan.

## 2022-06-14 NOTE — Progress Notes (Signed)
Mobility Specialist - Progress Note   06/14/22 1200  Mobility  Activity Ambulated with assistance in room;Transferred from bed to chair  Level of Assistance Minimal assist, patient does 75% or more  Assistive Device Front wheel walker  Distance Ambulated (ft) 3 ft  Range of Motion/Exercises Active  Activity Response Tolerated well  Mobility Referral Yes  $Mobility charge 1 Mobility     Pt lying in bed upon arrival, utilizing RA. Encouragement for pt participation in OOB activity this date but agreeable. Pt c/o pain in L side extending from UE to flank area down to LE that is sensitive to touch. Able to perform bed-level therex in all extremities prior to OOB attempt. MaxA to come EOB, good sitting balance with increased time. Pt STS and SPT to recliner with min-modA +2 for safety. VC for sequencing steps. Pt left in chair with alarm set, needs in reach.    Kathee Delton Mobility Specialist 06/14/22, 12:55 PM

## 2022-06-15 ENCOUNTER — Inpatient Hospital Stay: Payer: Medicare Other

## 2022-06-15 DIAGNOSIS — K5981 Ogilvie syndrome: Secondary | ICD-10-CM | POA: Diagnosis not present

## 2022-06-15 DIAGNOSIS — I4892 Unspecified atrial flutter: Secondary | ICD-10-CM | POA: Diagnosis not present

## 2022-06-15 DIAGNOSIS — M4802 Spinal stenosis, cervical region: Secondary | ICD-10-CM | POA: Diagnosis not present

## 2022-06-15 DIAGNOSIS — R0603 Acute respiratory distress: Secondary | ICD-10-CM | POA: Diagnosis not present

## 2022-06-15 LAB — COMPREHENSIVE METABOLIC PANEL
ALT: 55 U/L — ABNORMAL HIGH (ref 0–44)
AST: 34 U/L (ref 15–41)
Albumin: 3.4 g/dL — ABNORMAL LOW (ref 3.5–5.0)
Alkaline Phosphatase: 57 U/L (ref 38–126)
Anion gap: 5 (ref 5–15)
BUN: 29 mg/dL — ABNORMAL HIGH (ref 8–23)
CO2: 24 mmol/L (ref 22–32)
Calcium: 9 mg/dL (ref 8.9–10.3)
Chloride: 115 mmol/L — ABNORMAL HIGH (ref 98–111)
Creatinine, Ser: 1.03 mg/dL (ref 0.61–1.24)
GFR, Estimated: >60 mL/min (ref >60)
Glucose, Bld: 111 mg/dL — ABNORMAL HIGH (ref 70–99)
Potassium: 4 mmol/L (ref 3.5–5.1)
Sodium: 144 mmol/L (ref 135–145)
Total Bilirubin: 0.5 mg/dL (ref 0.3–1.2)
Total Protein: 6.7 g/dL (ref 6.5–8.1)

## 2022-06-15 LAB — CBC
HCT: 38.7 % — ABNORMAL LOW (ref 39.0–52.0)
Hemoglobin: 11.9 g/dL — ABNORMAL LOW (ref 13.0–17.0)
MCH: 30.3 pg (ref 26.0–34.0)
MCHC: 30.7 g/dL (ref 30.0–36.0)
MCV: 98.5 fL (ref 80.0–100.0)
Platelets: 268 10*3/uL (ref 150–400)
RBC: 3.93 MIL/uL — ABNORMAL LOW (ref 4.22–5.81)
RDW: 12.6 % (ref 11.5–15.5)
WBC: 6.3 10*3/uL (ref 4.0–10.5)
nRBC: 0 % (ref 0.0–0.2)

## 2022-06-15 LAB — GLUCOSE, CAPILLARY
Glucose-Capillary: 114 mg/dL — ABNORMAL HIGH (ref 70–99)
Glucose-Capillary: 122 mg/dL — ABNORMAL HIGH (ref 70–99)
Glucose-Capillary: 128 mg/dL — ABNORMAL HIGH (ref 70–99)
Glucose-Capillary: 141 mg/dL — ABNORMAL HIGH (ref 70–99)

## 2022-06-15 LAB — PHOSPHORUS: Phosphorus: 3.5 mg/dL (ref 2.5–4.6)

## 2022-06-15 LAB — TRIGLYCERIDES: Triglycerides: 94 mg/dL (ref <150)

## 2022-06-15 LAB — MAGNESIUM: Magnesium: 1.7 mg/dL (ref 1.7–2.4)

## 2022-06-15 MED ORDER — ENOXAPARIN SODIUM 100 MG/ML IJ SOSY
1.0000 mg/kg | PREFILLED_SYRINGE | Freq: Two times a day (BID) | INTRAMUSCULAR | Status: DC
  Administered 2022-06-15 – 2022-06-16 (×2): 92.5 mg via SUBCUTANEOUS
  Filled 2022-06-15 (×2): qty 0.93

## 2022-06-15 MED ORDER — TRACE MINERALS CU-MN-SE-ZN 300-55-60-3000 MCG/ML IV SOLN
INTRAVENOUS | Status: AC
  Filled 2022-06-15: qty 672

## 2022-06-15 NOTE — Progress Notes (Signed)
Patient seen and examined.  I agree with [name].  I had another extensive discussion with daughter brother today.  Clinically improving slowly.  Again discussed pros and cons of operative intervention.  He may need subtotal colectomy with end ileostomy and open gastrostomy tube.  Once again this is very morbid and I am not sure if he will survive it or do well with this. As stated before he is clinically doing better and I do think that is worth trying to do another swallow test slowly advance his diet and let him declare himself.  I encouraged the patient and also the family once again to make decisions regarding self-care.  I spent at least half an hour with them today counseling about potential surgical intervention and risks associated with this.

## 2022-06-15 NOTE — Progress Notes (Signed)
Occupational Therapy Treatment Patient Details Name: Calvin [name] MRN: [id number] DOB: 05/27/1951 Today's Date: 06/15/2022   History of present illness Calvin [name] is a 43yoM who comes to [facility] on 05/28/22 from facility SBO. PMH: ileus, HTN, HLD, COPD, multifocal CVA c Left hemiplegia and most recent MRI showing "remote infarcts in the right occipital, lateral frontal cortex and in the left cerebellum," GERD, gout, depression, GAD, seizure, CAD, MI, ETOH abuse, CKD3a, dCHF, colon CA, thyroid CA, chronic vertebral artery occlusion, cervical spondylosis with most recent MRI showing "Multilevel cervical spondylosis, most pronounced at the C3-4 level where there is moderate-to-severe canal stenosis and severe  bilateral foraminal stenosis. Findings slightly progressed from  prior MRI."   OT comments  Calvin [name] was seen for OT/PT co-treatment on this date. Upon arrival to room pt reclined in bed, noted to have purewick malfunction requiring bed change. MOD A for L+R rolling, MIN A don/doff gown bed level. MAX A x2 exit bed, fair sitting tolerance. MIN A x2 lateral scoot t/f. Pt agreeable to standing trial, MIN A x2 + RW from elevated bed height, tolerates ~3 min standing and marches in place. Pt appears sad and requesting to talk to chaplain, RN notified. Pt making good progress toward goals, will continue to follow POC. Discharge recommendation remains appropriate.     Recommendations for follow up therapy are one component of a multi-disciplinary discharge planning process, led by the attending physician.  Recommendations may be updated based on patient status, additional functional criteria and insurance authorization.    Follow Up Recommendations  Skilled nursing-short term rehab (<3 hours/day)    Assistance Recommended at Discharge Frequent or constant Supervision/Assistance  Patient can return home with the following  Two people to help with walking and/or transfers;A lot of help with  bathing/dressing/bathroom;Assistance with cooking/housework;[name];Assist for transportation;Direct supervision/assist for medications management   Equipment Recommendations  Other (comment) (defer)    Recommendations for Other Services      Precautions / Restrictions Precautions Precautions: Fall Precaution Comments: Seizure precautions Restrictions Weight Bearing Restrictions: No       Mobility Bed Mobility Overal bed mobility: Needs Assistance Bed Mobility: Rolling, Supine to Sit, Sit to Supine Rolling: Mod assist   Supine to sit: Max assist, +2 for physical assistance Sit to supine: Mod assist, +2 for physical assistance        Transfers Overall transfer level: Needs assistance Equipment used: Rolling walker (2 wheels) Transfers: Sit to/from Stand, Bed to chair/wheelchair/BSC Sit to Stand: Min assist, +2 physical assistance, From elevated surface          Lateral/Scoot Transfers: Min assist, +2 physical assistance       Balance Overall balance assessment: Needs assistance Sitting-balance support: Bilateral upper extremity supported, Feet unsupported Sitting balance-Leahy Scale: Fair     Standing balance support: Bilateral upper extremity supported, During functional activity, Reliant on assistive device for balance Standing balance-Leahy Scale: Fair                             ADL either performed or assessed with clinical judgement   ADL Overall ADL's : Needs assistance/impaired                                       General ADL Comments: MIN A don/doff gown at bed level. MIN A x2 +  RW for simulated BSC t/f      Cognition Arousal/Alertness: Awake/alert Behavior During Therapy: Flat affect Overall Cognitive Status: No family/caregiver present to determine baseline cognitive functioning                                 General Comments: follows commands with encouragement,  increased time.                   Pertinent Vitals/ Pain       Pain Assessment Pain Assessment: Faces Faces Pain Scale: Hurts even more Pain Location: L jaw Pain Descriptors / Indicators: Sore, Aching Pain Intervention(s): Limited activity within patient's tolerance, Repositioned   Frequency  Min 2X/week        Progress Toward Goals  OT Goals(current goals can now be found in the care plan section)  Progress towards OT goals: Progressing toward goals  Acute Rehab OT Goals Patient Stated Goal: to go home OT Goal Formulation: With patient Time For Goal Achievement: 06/22/22 Potential to Achieve Goals: Fair ADL Goals Pt Will Perform Grooming: with set-up;with supervision;sitting Pt Will Perform Lower Body Bathing: with min assist Pt Will Perform Lower Body Dressing: with mod assist;sitting/lateral leans Pt Will Transfer to Toilet: with mod assist;with +2 assist;stand pivot transfer;bedside commode Pt Will Perform Toileting - Clothing Manipulation and hygiene: with min guard assist;sitting/lateral leans  Plan Discharge plan remains appropriate;Frequency remains appropriate    Co-evaluation    PT/OT/SLP Co-Evaluation/Treatment: Yes Reason for Co-Treatment: For patient/therapist safety;To address functional/ADL transfers PT goals addressed during session: Mobility/safety with mobility OT goals addressed during session: ADL's and self-care      AM-PAC OT "6 Clicks" Daily Activity     Outcome Measure   Help from another person eating meals?: Total (NPO) Help from another person taking care of personal grooming?: A Little Help from another person toileting, which includes using toliet, bedpan, or urinal?: A Lot Help from another person bathing (including washing, rinsing, drying)?: A Lot Help from another person to put on and taking off regular upper body clothing?: A Lot Help from another person to put on and taking off regular lower body clothing?: A Lot 6 Click  Score: 12    End of Session Equipment Utilized During Treatment: Rolling walker (2 wheels)  OT Visit Diagnosis: Muscle weakness (generalized) (M62.81);Unsteadiness on feet (R26.81)   Activity Tolerance Patient tolerated treatment well   Patient Left in bed;with call bell/phone within reach;with bed alarm set   Nurse Communication Mobility status        Time: 1937-9024 OT Time Calculation (min): 28 min  Charges: OT General Charges $OT Visit: 1 Visit OT Treatments $Self Care/Home Management : 8-22 mins  Dessie Coma, M.S. OTR/L  06/15/22, 4:08 PM  ascom [phone]

## 2022-06-15 NOTE — Progress Notes (Signed)
Patient received to floor alert and oriented.  Patient rectal tube and purewick reconnected to LIWS.  Repositioned for comfort. No other needs verbalized at this time.

## 2022-06-15 NOTE — Progress Notes (Signed)
Physical Therapy Treatment Patient Details Name: Calvin [name] MRN: [id number] DOB: November 19, 1950 Today's Date: 06/15/2022   History of Present Illness Calvin [name] is a 74yoM who comes to [facility] on 05/28/22 from facility SBO. PMH: ileus, HTN, HLD, COPD, multifocal CVA c Left hemiplegia and most recent MRI showing "remote infarcts in the right occipital, lateral frontal cortex and in the left cerebellum," GERD, gout, depression, GAD, seizure, CAD, MI, ETOH abuse, CKD3a, dCHF, colon CA, thyroid CA, chronic vertebral artery occlusion, cervical spondylosis with most recent MRI showing "Multilevel cervical spondylosis, most pronounced at the C3-4 level where there is moderate-to-severe canal stenosis and severe  bilateral foraminal stenosis. Findings slightly progressed from  prior MRI."    PT Comments    Pt received supine in bed agreeable to PT/OT co-treat. Noted soiled bed thus requiring maxA+2 for rolling R/L for doffing and donning linens. maxA+2 to perform bed mobility both directions this date. Initial posterior bias requiring min to modA +2 and multimodal cuing for upright posture and anterior trunk lean. This improves with time, not with cues where pt able to maintain sitting with close SBA.  Attempts made for R sided scoots to [facility]. Initially requiring min to modA+2 and multimodal cuing for heads/hip relationship from PT/OT but last 2 reps pt performing with minguard+2. MinA+2 to stand toelrating probably clsoe to 3 minutes with LUE supported with HHA due to pain gripping walker. No post bias in standing with performance of lateral R/L side stepping/hip abd/add x10 each LE. Pt fatiguing requesting a seat and returning to supine with mod to maxA to fully return to bed. Pt is able to initiate but needs fair assistance for total return. D/c recs remain appropriate.    Recommendations for follow up therapy are one component of a multi-disciplinary discharge planning process, led by the attending physician.   Recommendations may be updated based on patient status, additional functional criteria and insurance authorization.  Follow Up Recommendations  Skilled nursing-short term rehab (<3 hours/day) Can patient physically be transported by private vehicle: No   Assistance Recommended at Discharge Frequent or constant Supervision/Assistance  Patient can return home with the following Assistance with cooking/housework;Direct supervision/assist for financial management;Assist for transportation;Direct supervision/assist for medications management;A lot of help with bathing/dressing/bathroom;Two people to help with walking and/or transfers   Equipment Recommendations  None recommended by PT    Recommendations for Other Services       Precautions / Restrictions Precautions Precautions: Fall Precaution Comments: Seizure precautions Restrictions Weight Bearing Restrictions: No     Mobility  Bed Mobility Overal bed mobility: Needs Assistance Bed Mobility: Supine to Sit, Sit to Supine Rolling: Max assist, +2 for physical assistance   Supine to sit: Max assist, +2 for physical assistance Sit to supine: Max assist, +2 for physical assistance     Patient Response: Cooperative, Flat affect  Transfers Overall transfer level: Needs assistance Equipment used: Rolling walker (2 wheels) Transfers: Sit to/from Stand Sit to Stand: From elevated surface, +2 physical assistance, Min assist           General transfer comment: minA +2. Pt lightly holding pt's L hand due to pain    Ambulation/Gait               General Gait Details: Some small, lateral side steps maybe 1-2, to HOB.   Stairs             Wheelchair Mobility    Modified Rankin (Stroke Patients Only)  Balance Overall balance assessment: Needs assistance Sitting-balance support: Bilateral upper extremity supported, Feet unsupported Sitting balance-Leahy Scale: Fair Sitting balance - Comments: Initially  poor, progressed to fair with VC's and anterior trunk lean. Postural control: Posterior lean Standing balance support: Bilateral upper extremity supported, During functional activity, Reliant on assistive device for balance Standing balance-Leahy Scale: Fair Standing balance comment: no posterior LOB. Fair standing with RW                            Cognition Arousal/Alertness: Awake/alert Behavior During Therapy: Flat affect Overall Cognitive Status: No family/caregiver present to determine baseline cognitive functioning                                 General Comments: follows commands with encouragement, increased time.        Exercises General Exercises - Lower Extremity Long Arc Quad: AROM, Strengthening, Both, 10 reps, Seated Hip Flexion/Marching: AROM, Strengthening, Seated, Both, 10 reps Toe Raises: AROM, Strengthening, Both, 5 reps, Seated Heel Raises: AROM, Strengthening, Seated, Both, 5 reps Other Exercises Other Exercises: Lateral R/L side steps x10/direction.    General Comments        Pertinent Vitals/Pain Pain Assessment Pain Assessment: Faces Faces Pain Scale: Hurts whole lot Pain Location: L jaw Pain Descriptors / Indicators: Sore, Aching Pain Intervention(s): Repositioned, Monitored during session, Limited activity within patient's tolerance    Home Living                          Prior Function            PT Goals (current goals can now be found in the care plan section) Acute Rehab PT Goals PT Goal Formulation: With patient Progress towards PT goals: Progressing toward goals    Frequency    Min 2X/week      PT Plan Current plan remains appropriate    Co-evaluation PT/OT/SLP Co-Evaluation/Treatment: Yes Reason for Co-Treatment: For patient/therapist safety;Complexity of the patient's impairments (multi-system involvement);To address functional/ADL transfers PT goals addressed during session:  Mobility/safety with mobility;Balance;Proper use of DME;Strengthening/ROM OT goals addressed during session: ADL's and self-care;Proper use of Adaptive equipment and DME      AM-PAC PT "6 Clicks" Mobility   Outcome Measure  Help needed turning from your back to your side while in a flat bed without using bedrails?: A Lot Help needed moving from lying on your back to sitting on the side of a flat bed without using bedrails?: A Lot Help needed moving to and from a bed to a chair (including a wheelchair)?: A Lot Help needed standing up from a chair using your arms (e.g., wheelchair or bedside chair)?: A Lot Help needed to walk in hospital room?: Total Help needed climbing 3-5 steps with a railing? : Total 6 Click Score: 10    End of Session Equipment Utilized During Treatment: Gait belt Activity Tolerance: Patient tolerated treatment well Patient left: in bed;with call bell/phone within reach;with bed alarm set;with family/visitor present Nurse Communication: Mobility status PT Visit Diagnosis: Muscle weakness (generalized) (M62.81);Difficulty in walking, not elsewhere classified (R26.2)     Time: 5188-4166 PT Time Calculation (min) (ACUTE ONLY): 27 min  Charges:  $Therapeutic Exercise: 8-22 mins                    Salem Caster. Fairly IV, PT, DPT  Physical Therapist- [facility]  06/15/2022, 3:03 PM

## 2022-06-15 NOTE — Progress Notes (Signed)
Progress Note   Patient: Calvin [name]:[id number] DOB: 1950-10-01 DOA: 05/28/2022     18 DOS: the patient was seen and examined on 06/15/2022   Brief hospital course:  Calvin [name] is a 72 y.o. male with medical history significant of Ogilvie's syndrome, ileus, hypertension, hyperlipidemia, COPD, stroke with mild left-sided weakness, GERD, gout, depression with anxiety, seizure, CAD, MI,  alcohol abuse, CKD stage IIIa, dCHF, colon cancer, thyroid cancer, who presents from his facility with Nausea, vomiting, abdominal distention, abdominal pain for 2 days. Found to have new small bowel obstruction on CT abdomen with transition point in the right lower abdomen suspicious for adhesion. General surgery was consulted and NG tube was placed.  Patient also developed left-sided tingling and numbness and facial droop while in ED. CT head and MRI brain was negative for any acute intracranial abnormality and do reflect a chronic changes. 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  MRI of the cervical spine showed C3-C4 moderate to severe spinal stenosis, seen by neurosurgery, recommended outpatient follow-up.  9/25: Continued to have significant colonic distention.  NG suction continued. The patient was transferred to the stepdown unit for neostigmine dosing on 05/31/2022 and 06/01/2022.   Patient had a colonoscopy decompression on 9/29 and 10/3, followed with rectal tube. Tube feeding started on 10/30-10/3.  10/4.  Modified barium swallow showed a significant risk of aspiration.  TPN started after PICC line placed.  10/10.  Patient currently on TPN, GI cannot place PEG tube due to worsening due to abdominal distention.  Patient will need a total colectomy in order to survive.  Calvin [name] spoke with Calvin [name] from [facility] and he did not believe that he was a good surgical candidate and can touch back with them in a week if not improved.  10/12.  Patient considering major surgical  intervention requiring colectomy and ileostomy and feeding tube.  Patient will have a swallow evaluation tomorrow.  Assessment and Plan: Ogilvie's syndrome General surgery does not think this is bowel obstruction.  Gastroenterology gave neostigmine infusion on 05/31/2022 and on 06/01/22.  Decompressive colonoscopy procedure on 06/02/2022 and on 06/06/2022 and left a rectal tube in. Patient did not do well with barium swallow exam.  Patient currently on TPN.  Patient will have another swallow evaluation tomorrow.  Patient considering major surgical procedure including colectomy and ileostomy with feeding tube.  Case discussed with GI, general surgery.  Patient's abdomen is less distended than last week but still has rectal tube and draining the air out.  Paroxysmal atrial flutter (HCC) Currently rate controlled.  On as needed IV metoprolol.  Lovenox to be restarted.  Cervical spinal stenosis MRI showing multilevel cervical spondylosis most pronounced at C3-C4 where moderate to severe canal stenosis and severe bilateral foraminal stenosis.  Patient is not a great surgical candidate at this point.   Respiratory distress Respiratory distress on 06/01/2022 with upper airway congestion.  Improved with Decadron and racemic epinephrine infusion.  Currently not on oxygen.  Chronic diastolic CHF (congestive heart failure) (HCC) -Last EF 50% -Hold Lasix   Seizures (HCC) Seizure -Seizure precaution -When necessary Ativan for seizure -IV Keppra and Depakote.    Aspiration pneumonia ([place]) Completed 5 days of Zosyn on 06/05/2022.  HTN (hypertension) NG tube out and failed swallow eval.  As needed IV metoprolol.  CAD (coronary artery disease) No chest pain.  Troponin negative x2   HLD (hyperlipidemia) Hold Zocor  Chronic kidney disease, stage 3a (HCC) Last creatinine 1.03 with  a GFR greater than 60  COPD (chronic obstructive pulmonary disease) (HCC) Continue nebulizers  Coffee ground  emesis Last hemoglobin 11.6.  Tobacco abuse -nicotine patch  Depression with anxiety Hold home oral Xanax and Zoloft -As needed IV Ativan for anxiety  Dysphagia Did not do well with swallow evaluation.  Neuropathy Continue gabapentin        Subjective: Patient feels improving.  Little numbness on the left side of his face today.  Still having some bowel movements.  No nausea or vomiting.  Physical Exam: Vitals:   06/15/22 0500 06/15/22 0519 06/15/22 0727 06/15/22 1511  BP:  118/67 128/72 138/66  Pulse:  72 80 87  Resp:  '16 14 12  '$ Temp:  98.5 F (36.9 C) 97.9 F (36.6 C) 98.2 F (36.8 C)  TempSrc:  Oral Oral Oral  SpO2:  98% 99% 100%  Weight: 92.6 kg     Height:       Physical Exam HENT:     Head: Normocephalic.     Mouth/Throat:     Pharynx: No oropharyngeal exudate.  Eyes:     General: Lids are normal.     Conjunctiva/sclera: Conjunctivae normal.  Cardiovascular:     Rate and Rhythm: Normal rate and regular rhythm.     Heart sounds: Normal heart sounds, S1 normal and S2 normal.  Pulmonary:     Breath sounds: No decreased breath sounds, wheezing, rhonchi or rales.  Abdominal:     General: There is distension.     Palpations: Abdomen is soft.     Tenderness: There is no abdominal tenderness.  Musculoskeletal:     Right lower leg: Swelling present.     Left lower leg: Swelling present.  Skin:    General: Skin is warm.     Findings: No rash.  Neurological:     Mental Status: He is alert.     Data Reviewed: Abdominal x-ray still shows some distention.  Family Communication: Spoke with family at the bedside today, spoke with daughter on the phone this afternoon Disposition: Status is: Inpatient Remains inpatient appropriate because: Patient will have another swallow evaluation tomorrow to see if he passes.  Patient still has rectal tube and to help deflate air in his colon.  Patient is considered a major operation to remove his colon and feeding tube.     Planned Discharge Destination: Rehab    Time spent: 28 minutes  Author: Loletha Grayer, MD 06/15/2022 4:23 PM  For on call review [email or web address].

## 2022-06-15 NOTE — TOC Progression Note (Signed)
Transition of Care [facility]) - Progression Note    Patient Details  Name: Calvin [name] MRN: [id number] Date of Birth: 02/03/1951  Transition of [facility]) CM/SW [place], LCSW Phone Number: 06/15/2022, 12:00 PM  Clinical Narrative:   Received call from daughter. She is considering having patient return to [facility] with hospice. Admissions coordinator has to get that approved with nursing staff but will call back. Their preferred hospice agency is [facility].  Expected Discharge Plan: [place] Barriers to Discharge: Continued Medical Work up  Expected Discharge Plan and Services Expected Discharge Plan: [place]   Discharge Planning Services: CM Consult Post Acute Care Choice: Resumption of Svcs/PTA Provider, [place] Living arrangements for the past 2 months: [place]                 DME Arranged: N/A DME Agency: NA       HH Arranged: NA HH Agency: NA         Social Determinants of Health (SDOH) Interventions    Readmission Risk Interventions    06/02/2022   12:02 PM 10/17/2021   10:47 AM  Readmission Risk Prevention Plan  Transportation Screening Complete Complete  PCP or Specialist Appt within 3-5 Days  Complete  Social Work Consult for [place] Planning/Counseling  Complete  Palliative Care Screening  Not Applicable  Medication Review Press photographer) Complete Complete  PCP or Specialist appointment within 3-5 days of discharge Complete   HRI or [place] Complete   SW Recovery Care/Counseling Consult Complete   Palliative Care Screening Complete   Skilled Nursing Facility Complete

## 2022-06-15 NOTE — Consult Note (Signed)
PHARMACY - TOTAL PARENTERAL NUTRITION CONSULT NOTE   Indication:  Intolerance to enteral feeding  Patient Measurements: Height: '5\' 11"'$  (180.3 cm) Weight: 92.6 kg (204 lb 2.3 oz) IBW/kg (Calculated) : 75.3 TPN AdjBW (KG): 80.5 Body mass index is 28.47 kg/m.  Assessment:  Patient is a 71 y/o M with medical history including Ogilvie's syndrome, HTN, HLD, COPD, CVA, GERD, gout, depression / anxiety, seizures, CAD, EtOH use disorder, CKD, diastolic CHF, colon cancer, thyroid cancer who is admitted with resolved SBO but now with worsening pseudoobstruction. GI consulted. Patient failed to respond to conservative measures with methylnaltrexone and neostigmine x 2. Patient underwent decompressive colonoscopy on 0/03 complicated by colonic distension post-procedurally. Patient underwent repeat decompressive colonoscopy on 10/3. Patient had been on tube feeds at trickle rate. Pharmacy consulted to initiate TPN to help meet nutritional goals given acute abdominal issues and intolerance to enteral nutrition.  10/10: -Elevated K and Phos. -GI unable to place PEG tube -Patient received IV contrast 10/9.  10/11: -K and Phos have normalized  10/12: -Scr back to  baseline (Scr = 1.03) -Cl elevated -Na trending up   Glucose / Insulin: Pre-diabetic range Hgb A1c (5.8% on 05/28/22). Last 24 hrs: 4 units insulin. Electrolytes: chloride = 115, Na = 144 Renal: Scr = 1.03, 10/11 250 ml documented urine output  Hepatic: ALT = 55 Intake / Output; MIVF: +6.9L for the admission. No MIVF running GI Imaging: 9/24 CT abdomen / pelvis: SBO and chronic colonic ileus or pseudo-obstruction 9/26 Abdominal X-ray: Air-filled distention of large and small bowel loops favoring ileus 9/27 CT abdomen / pelvis: No evidence of SBO 9/29 Abdominal X-ray: Severe dilatation of the colon as can be seen with pseudo-obstruction/colonic ileus 9/30 Abdominal X-ray: Significant interval improvement in colonic distension, which  has mostly resolved 10/1 Abdominal X-ray: Persistent gaseous dilation of the colon, though mildly improved compared to 06/02/2022 10/2 Abdominal X-ray: Worsened gaseous distention of the colon 10/3 Interval placement of colonic decompression tube with decreased colonic gaseous distention. 10/9 Gaseous distention of transverse and proximal descending colon with interval worsening. GI Surgeries / Procedures:  9/29: Decompressive colonoscopy 10/3: Decompressive colonoscopy  Central access: DL PICC placed 10/4 TPN start date: 10/4  Nutritional Goals: Goal TPN rate is 60 mL/hr (provides101 g of protein and 1938 kcals per day)  RD Assessment: Estimated Needs Total Energy Estimated Needs: 1900-2200kcal/day Total Protein Estimated Needs: 95-110g/day Total Fluid Estimated Needs: 1.9-2.2L/day  Current Nutrition:  NPO  Plan:  --Per discussion with RD, attempting to concentrate TPN d/t fluid restriction (hx of HF) --Continue TPN at 60 mL/hr (goal rate) Goal rate TPN will provide: Protein: 101 g (70 g/L of Clinisol 15%) Dextrose: 346 g (24%) Lipids: 36 g (36 g/L) Kcals / day: 1938 Fluids: 1.4 L --Electrolytes in TPN: Na 20mq/L, Ca 555m/L, K 40 mEq/L, Phos 12 mmol/L and Mg 5 mEq/L. Maximize acetate --Add standard MVI and trace elements to TPN and thiamine 100 mg x 3 days (completed) --Continue Moderate q6h SSI and adjust as needed  --Monitor TPN labs on Mon/Thurs --Increase potassium and phosphate.  Decrease sodium.    HoLorin Picket0/08/2022,8:18 AM

## 2022-06-15 NOTE — Progress Notes (Signed)
[facility] SURGICAL PROGRESS NOTE (cpt [phone])  Hospital Day(s): 18.   Interval History:  Patient seen and examined No acute events overnight This morning, patient resting comfortably Unable to reliable obtain history given mental acuity/participation He remains without leukocytosis; 6.3K Hgb is stable at 11.9 PLT normal at 268K Renal function normal with sCr - 1.03; UO - 600 ccs + unmeasured No significant electrolyte derangements He is NPO with TPN KUB still with distended colon but improved slightly  Review of Systems:  Unable to reliably perform secondary to mental acuity/participation   Vital signs in last 24 hours: [min-max] current  Temp:  [97.4 F (36.3 C)-98.5 F (36.9 C)] 97.9 F (36.6 C) (10/12 0727) Pulse Rate:  [72-88] 80 (10/12 0727) Resp:  [14-17] 14 (10/12 0727) BP: (110-135)/(67-72) 128/72 (10/12 0727) SpO2:  [92 %-100 %] 99 % (10/12 0727) Weight:  [92.6 kg] 92.6 kg (10/12 0500)     Height: '5\' 11"'$  (180.3 cm) Weight: 92.6 kg BMI (Calculated): 28.49   Intake/Output last 2 shifts:  10/11 0701 - 10/12 0700 In: 993 [I.V.:633.7; IV Piggyback:359.2] Out: 600 [Urine:600]   Physical Exam:  Constitutional: alert, cooperative and no distress  HENT: normocephalic without obvious abnormality  Eyes: PERRL, EOM's grossly intact and symmetric  Respiratory: breathing non-labored at rest  Cardiovascular: regular rate and sinus rhythm  Gastrointestinal: soft, he does not appear tender, distension improved, no rebound/guarding. He is certainly without peritonitis.   Labs:     Latest Ref Rng & Units 06/15/2022    1:56 AM 06/13/2022   10:45 AM 06/12/2022   11:35 AM  CBC  WBC 4.0 - 10.5 K/uL 6.3  5.9  7.2   Hemoglobin 13.0 - 17.0 g/dL 11.9  11.6  13.2   Hematocrit 39.0 - 52.0 % 38.7  42.5  42.8   Platelets 150 - 400 K/uL 268  264  297       Latest Ref Rng & Units 06/15/2022    1:56 AM 06/14/2022    7:29 AM 06/13/2022    6:00 PM  CMP  Glucose 70  - 99 mg/dL 111  137  106   BUN 8 - 23 mg/dL '29  31  28   '$ Creatinine 0.61 - 1.24 mg/dL 1.03  1.21  1.10   Sodium 135 - 145 mmol/L 144  141  143   Potassium 3.5 - 5.1 mmol/L 4.0  4.3  4.7   Chloride 98 - 111 mmol/L 115  113  114   CO2 22 - 32 mmol/L '24  22  24   '$ Calcium 8.9 - 10.3 mg/dL 9.0  8.9  9.3   Total Protein 6.5 - 8.1 g/dL 6.7     Total Bilirubin 0.3 - 1.2 mg/dL 0.5     Alkaline Phos 38 - 126 U/L 57     AST 15 - 41 U/L 34     ALT 0 - 44 U/L 55        Imaging studies:   KUB (06/15/2022) personally reviewed which shows slight improvement in colonic distension but still present, and radiologist report reviewed below:  IMPRESSION: 1. Compared to 06/05/2022, new right upper extremity PICC tip in appropriate position. 2. Mild progression of some oral contrast within the more distal aspect of the descending colon. Again this oral contrast likely was administered for swallowing function study 06/07/2022. 3. Slight improvement in moderate distention of the ascending and transverse colon. No evidence of bowel obstruction.   Assessment/Plan: (ICD-10's: K43.609) 71 y.o. male with  chronic colonic distension/Ogilvie's s/p second decompressive colonoscopy on 25/36, complicated by significant comorbid disease, physical deconditioning, overall poor functional status.    - Again, Dr Dahlia Byes had long discussion with patient and his family on 10/10 regarding patients condition and possible surgical interventions. Palliation is on board and continues to have discussion with patient's family. On chart review from 10/11, it appears patient's family still agreeable with proceeding with surgical intervention if offered. He still remains a very sub-optimal candidate for surgical interventions which would likely be subtotal colectomy with end ileostomy and open gastrostomy tube placement. This would carry significant morbidity and he would be at extremely high risk of complication (ie: infection, bleeding,  bowel injury, wound dehiscence/evisceration, or even perioperative death). Additionally, he will almost certainly require some level of SNF care and enteral nutrition for an extended period of time if not for the remainder of his life. Fortunately for now, he is not peritonitic and has not perforated, so there is no indication for emergent intervention. Timing and decision per Dr Dahlia Byes.    - For now, certainly continue PICC and TPN -  Monitor abdominal examination; on-going bowel function - Consider serial KUBs prn            - Pain control prn; AVOID narcotics at all cost             - Further management per primary service; we will remain available   All of the above findings and recommendations were discussed with the medical team.   -- Edison Simon, PA-C [facility] 06/15/2022, 8:40 AM M-F: 7am - 4pm

## 2022-06-16 ENCOUNTER — Inpatient Hospital Stay: Payer: Medicare Other | Admitting: Registered Nurse

## 2022-06-16 ENCOUNTER — Encounter
Admission: EM | Disposition: A | Discharge status: Skilled Nursing Facility | Payer: Self-pay | Source: Skilled Nursing Facility | Attending: Internal Medicine

## 2022-06-16 ENCOUNTER — Other Ambulatory Visit: Payer: Self-pay

## 2022-06-16 DIAGNOSIS — Z01818 Encounter for other preprocedural examination: Secondary | ICD-10-CM | POA: Diagnosis not present

## 2022-06-16 DIAGNOSIS — M4802 Spinal stenosis, cervical region: Secondary | ICD-10-CM | POA: Diagnosis not present

## 2022-06-16 DIAGNOSIS — K5981 Ogilvie syndrome: Secondary | ICD-10-CM | POA: Diagnosis not present

## 2022-06-16 DIAGNOSIS — J690 Pneumonitis due to inhalation of food and vomit: Secondary | ICD-10-CM | POA: Diagnosis not present

## 2022-06-16 DIAGNOSIS — I4892 Unspecified atrial flutter: Secondary | ICD-10-CM | POA: Diagnosis not present

## 2022-06-16 DIAGNOSIS — Z515 Encounter for palliative care: Secondary | ICD-10-CM | POA: Diagnosis not present

## 2022-06-16 LAB — MAGNESIUM: Magnesium: 1.8 mg/dL (ref 1.7–2.4)

## 2022-06-16 LAB — GLUCOSE, CAPILLARY
Glucose-Capillary: 121 mg/dL — ABNORMAL HIGH (ref 70–99)
Glucose-Capillary: 123 mg/dL — ABNORMAL HIGH (ref 70–99)
Glucose-Capillary: 149 mg/dL — ABNORMAL HIGH (ref 70–99)
Glucose-Capillary: 153 mg/dL — ABNORMAL HIGH (ref 70–99)
Glucose-Capillary: 177 mg/dL — ABNORMAL HIGH (ref 70–99)

## 2022-06-16 LAB — BASIC METABOLIC PANEL
Anion gap: 7 (ref 5–15)
BUN: 24 mg/dL — ABNORMAL HIGH (ref 8–23)
CO2: 26 mmol/L (ref 22–32)
Calcium: 8.9 mg/dL (ref 8.9–10.3)
Chloride: 110 mmol/L (ref 98–111)
Creatinine, Ser: 0.9 mg/dL (ref 0.61–1.24)
GFR, Estimated: >60 mL/min (ref >60)
Glucose, Bld: 115 mg/dL — ABNORMAL HIGH (ref 70–99)
Potassium: 4.2 mmol/L (ref 3.5–5.1)
Sodium: 143 mmol/L (ref 135–145)

## 2022-06-16 LAB — TYPE AND SCREEN
ABO/RH(D): B POS
Antibody Screen: NEGATIVE

## 2022-06-16 LAB — PHOSPHORUS: Phosphorus: 3.6 mg/dL (ref 2.5–4.6)

## 2022-06-16 SURGERY — CREATION, COLOSTOMY, ROBOT-ASSISTED
Anesthesia: General | Site: Abdomen

## 2022-06-16 MED ORDER — PHENYLEPHRINE HCL-NACL 20-0.9 MG/250ML-% IV SOLN
INTRAVENOUS | Status: AC
  Filled 2022-06-16: qty 250

## 2022-06-16 MED ORDER — LACTATED RINGERS IV SOLN: INTRAVENOUS | Status: DC | PRN

## 2022-06-16 MED ORDER — SODIUM CHLORIDE 0.9 % IV SOLN
2.0000 g | Freq: Once | INTRAVENOUS | Status: AC
  Administered 2022-06-16: 2 g via INTRAVENOUS
  Filled 2022-06-16: qty 2

## 2022-06-16 MED ORDER — SODIUM CHLORIDE 0.9 % IV SOLN
INTRAVENOUS | Status: AC
  Filled 2022-06-16: qty 2

## 2022-06-16 MED ORDER — BUPIVACAINE LIPOSOME 1.3 % IJ SUSP
INTRAMUSCULAR | Status: AC
  Filled 2022-06-16: qty 20

## 2022-06-16 MED ORDER — BUPIVACAINE-EPINEPHRINE (PF) 0.25% -1:200000 IJ SOLN
INTRAMUSCULAR | Status: AC
  Filled 2022-06-16: qty 30

## 2022-06-16 MED ORDER — DEXAMETHASONE SODIUM PHOSPHATE 10 MG/ML IJ SOLN
INTRAMUSCULAR | Status: DC | PRN
  Administered 2022-06-16: 4 mg via INTRAVENOUS

## 2022-06-16 MED ORDER — SUCCINYLCHOLINE CHLORIDE 200 MG/10ML IV SOSY
PREFILLED_SYRINGE | INTRAVENOUS | Status: AC
  Filled 2022-06-16: qty 10

## 2022-06-16 MED ORDER — PROPOFOL 10 MG/ML IV BOLUS
INTRAVENOUS | Status: DC | PRN
  Administered 2022-06-16: 80 mg via INTRAVENOUS

## 2022-06-16 MED ORDER — OXYCODONE HCL 5 MG PO TABS: 5.0000 mg | ORAL_TABLET | Freq: Once | ORAL | Status: DC | PRN

## 2022-06-16 MED ORDER — ONDANSETRON HCL 4 MG/2ML IJ SOLN
INTRAMUSCULAR | Status: AC
  Filled 2022-06-16: qty 2

## 2022-06-16 MED ORDER — OXYCODONE HCL 5 MG/5ML PO SOLN: 5.0000 mg | Freq: Once | ORAL | Status: DC | PRN

## 2022-06-16 MED ORDER — ACETAMINOPHEN 10 MG/ML IV SOLN
INTRAVENOUS | Status: DC | PRN
  Administered 2022-06-16: 1000 mg via INTRAVENOUS

## 2022-06-16 MED ORDER — SUCCINYLCHOLINE CHLORIDE 200 MG/10ML IV SOSY
PREFILLED_SYRINGE | INTRAVENOUS | Status: DC | PRN
  Administered 2022-06-16: 100 mg via INTRAVENOUS

## 2022-06-16 MED ORDER — 0.9 % SODIUM CHLORIDE (POUR BTL) OPTIME
TOPICAL | Status: DC | PRN
  Administered 2022-06-16: 500 mL

## 2022-06-16 MED ORDER — ACETAMINOPHEN 10 MG/ML IV SOLN
INTRAVENOUS | Status: AC
  Filled 2022-06-16: qty 100

## 2022-06-16 MED ORDER — ACETAMINOPHEN 10 MG/ML IV SOLN: 1000.0000 mg | Freq: Once | INTRAVENOUS | Status: DC | PRN

## 2022-06-16 MED ORDER — FENTANYL CITRATE (PF) 100 MCG/2ML IJ SOLN
INTRAMUSCULAR | Status: AC
  Administered 2022-06-16: 25 ug via INTRAVENOUS
  Filled 2022-06-16: qty 2

## 2022-06-16 MED ORDER — BUPIVACAINE-EPINEPHRINE 0.25% -1:200000 IJ SOLN
INTRAMUSCULAR | Status: DC | PRN
  Administered 2022-06-16: 50 mL via INTRAMUSCULAR

## 2022-06-16 MED ORDER — LIDOCAINE HCL (PF) 2 % IJ SOLN
INTRAMUSCULAR | Status: AC
  Filled 2022-06-16: qty 5

## 2022-06-16 MED ORDER — PHENYLEPHRINE HCL (PRESSORS) 10 MG/ML IV SOLN
INTRAVENOUS | Status: DC | PRN
  Administered 2022-06-16 (×3): 160 ug via INTRAVENOUS

## 2022-06-16 MED ORDER — HYDROMORPHONE HCL 1 MG/ML IJ SOLN: 0.2500 mg | INTRAMUSCULAR | Status: DC | PRN

## 2022-06-16 MED ORDER — KETOROLAC TROMETHAMINE 15 MG/ML IJ SOLN
15.0000 mg | Freq: Four times a day (QID) | INTRAMUSCULAR | Status: DC
  Administered 2022-06-16 – 2022-06-21 (×19): 15 mg via INTRAVENOUS
  Filled 2022-06-16 (×20): qty 1

## 2022-06-16 MED ORDER — SODIUM CHLORIDE (PF) 0.9 % IJ SOLN
INTRAMUSCULAR | Status: AC
  Filled 2022-06-16: qty 50

## 2022-06-16 MED ORDER — FENTANYL 50 MCG/ML (10ML) SYRINGE FOR MEDFUSION PUMP - OPTIME
INTRAMUSCULAR | Status: DC | PRN
  Administered 2022-06-16: 100 ug via INTRAVENOUS
  Administered 2022-06-16 (×3): 50 ug via INTRAVENOUS

## 2022-06-16 MED ORDER — TRACE MINERALS CU-MN-SE-ZN 300-55-60-3000 MCG/ML IV SOLN
INTRAVENOUS | Status: AC
  Filled 2022-06-16: qty 672

## 2022-06-16 MED ORDER — SODIUM CHLORIDE 0.9% IV SOLUTION: Freq: Once | INTRAVENOUS | Status: DC

## 2022-06-16 MED ORDER — MORPHINE SULFATE (PF) 2 MG/ML IV SOLN
2.0000 mg | INTRAVENOUS | Status: DC | PRN
  Administered 2022-06-17 – 2022-06-30 (×21): 2 mg via INTRAVENOUS
  Filled 2022-06-16 (×21): qty 1

## 2022-06-16 MED ORDER — DEXAMETHASONE SODIUM PHOSPHATE 10 MG/ML IJ SOLN
INTRAMUSCULAR | Status: AC
  Filled 2022-06-16: qty 1

## 2022-06-16 MED ORDER — ONDANSETRON HCL 4 MG/2ML IJ SOLN
INTRAMUSCULAR | Status: DC | PRN
  Administered 2022-06-16: 4 mg via INTRAVENOUS

## 2022-06-16 MED ORDER — ROCURONIUM BROMIDE 100 MG/10ML IV SOLN
INTRAVENOUS | Status: DC | PRN
  Administered 2022-06-16: 30 mg via INTRAVENOUS
  Administered 2022-06-16: 20 mg via INTRAVENOUS
  Administered 2022-06-16: 40 mg via INTRAVENOUS
  Administered 2022-06-16: 10 mg via INTRAVENOUS

## 2022-06-16 MED ORDER — FENTANYL CITRATE (PF) 100 MCG/2ML IJ SOLN
25.0000 ug | INTRAMUSCULAR | Status: DC | PRN
  Administered 2022-06-16 (×3): 25 ug via INTRAVENOUS

## 2022-06-16 MED ORDER — SUGAMMADEX SODIUM 200 MG/2ML IV SOLN
INTRAVENOUS | Status: DC | PRN
  Administered 2022-06-16: 200 mg via INTRAVENOUS

## 2022-06-16 MED ORDER — LIDOCAINE HCL (CARDIAC) PF 100 MG/5ML IV SOSY
PREFILLED_SYRINGE | INTRAVENOUS | Status: DC | PRN
  Administered 2022-06-16: 100 mg via INTRAVENOUS

## 2022-06-16 MED ORDER — ACETAMINOPHEN 500 MG PO TABS
1000.0000 mg | ORAL_TABLET | Freq: Four times a day (QID) | ORAL | Status: DC
  Administered 2022-06-19 – 2022-06-21 (×7): 1000 mg via ORAL
  Filled 2022-06-16 (×9): qty 2

## 2022-06-16 MED ORDER — PHENYLEPHRINE HCL-NACL 20-0.9 MG/250ML-% IV SOLN
INTRAVENOUS | Status: DC | PRN
  Administered 2022-06-16: 25 ug/min via INTRAVENOUS

## 2022-06-16 SURGICAL SUPPLY — 102 items
ADH LQ OCL WTPRF AMP STRL LF (MISCELLANEOUS) ×4
ADH SKN CLS APL DERMABOND .7 (GAUZE/BANDAGES/DRESSINGS) ×2
ADHESIVE MASTISOL STRL (MISCELLANEOUS) ×2 IMPLANT
APL PRP STRL LF DISP 70% ISPRP (MISCELLANEOUS) ×2
BAG LAPAROSCOPIC 12 15 PORT 16 (BASKET) IMPLANT
BAG RETRIEVAL 12/15 (BASKET)
CANNULA REDUC XI 12-8 STAPL (CANNULA) ×2
CANNULA REDUCER 12-8 DVNC XI (CANNULA) ×2 IMPLANT
CHLORAPREP W/TINT 26 (MISCELLANEOUS) ×2 IMPLANT
COVER MAYO STAND REUSABLE (DRAPES) ×2 IMPLANT
COVER TIP SHEARS 8 DVNC (MISCELLANEOUS) ×2 IMPLANT
COVER TIP SHEARS 8MM DA VINCI (MISCELLANEOUS) ×2
DEFOGGER SCOPE WARMER CLEARIFY (MISCELLANEOUS) ×2 IMPLANT
DERMABOND ADVANCED .7 DNX12 (GAUZE/BANDAGES/DRESSINGS) ×2 IMPLANT
DRAPE 3/4 80X56 (DRAPES) ×2 IMPLANT
DRAPE ARM DVNC X/XI (DISPOSABLE) ×8 IMPLANT
DRAPE COLUMN DVNC XI (DISPOSABLE) ×2 IMPLANT
DRAPE DA VINCI XI ARM (DISPOSABLE) ×8
DRAPE DA VINCI XI COLUMN (DISPOSABLE) ×2
DRAPE LEGGINS SURG 28X43 STRL (DRAPES) ×1 IMPLANT
DRAPE UNDER BUTTOCK W/FLU (DRAPES) ×1 IMPLANT
ELECT BLADE 6.5 EXT (BLADE) ×2 IMPLANT
ELECT CAUTERY BLADE 6.4 (BLADE) ×2 IMPLANT
ELECT REM PT RETURN 9FT ADLT (ELECTROSURGICAL) ×2
ELECTRODE REM PT RTRN 9FT ADLT (ELECTROSURGICAL) ×2 IMPLANT
GLOVE BIO SURGEON STRL SZ7 (GLOVE) ×6 IMPLANT
GOWN STRL REUS W/ TWL LRG LVL3 (GOWN DISPOSABLE) ×10 IMPLANT
GOWN STRL REUS W/TWL LRG LVL3 (GOWN DISPOSABLE) ×10
GRASPER LAPSCPC 5X45 DSP (INSTRUMENTS) ×2 IMPLANT
GRASPER SUT TROCAR 14GX15 (MISCELLANEOUS) ×1 IMPLANT
HANDLE YANKAUER SUCT BULB TIP (MISCELLANEOUS) ×1 IMPLANT
IRRIGATION STRYKERFLOW (MISCELLANEOUS) ×1 IMPLANT
IRRIGATOR STRYKERFLOW (MISCELLANEOUS)
IV NS 1000ML (IV SOLUTION) ×2
IV NS 1000ML BAXH (IV SOLUTION) ×2 IMPLANT
KIT IMAGING PINPOINTPAQ (MISCELLANEOUS) ×2 IMPLANT
KIT OSTOMY DRAINABLE 2.75 STR (WOUND CARE) ×1 IMPLANT
KIT PINK PAD W/HEAD ARE REST (MISCELLANEOUS) ×2 IMPLANT
KIT PINK PAD W/HEAD ARM REST (MISCELLANEOUS) ×2 IMPLANT
LABEL OR SOLS (LABEL) ×2 IMPLANT
MANIFOLD NEPTUNE II (INSTRUMENTS) ×2 IMPLANT
NDL INSUFFLATION 14GA 120MM (NEEDLE) ×1 IMPLANT
NEEDLE HYPO 22GX1.5 SAFETY (NEEDLE) ×2 IMPLANT
NEEDLE INSUFFLATION 14GA 120MM (NEEDLE) ×2 IMPLANT
NS IRRIG 500ML POUR BTL (IV SOLUTION) ×2 IMPLANT
OBTURATOR OPTICAL STANDARD 8MM (TROCAR) ×2
OBTURATOR OPTICAL STND 8 DVNC (TROCAR) ×2
OBTURATOR OPTICALSTD 8 DVNC (TROCAR) ×2 IMPLANT
PACK LAP CHOLECYSTECTOMY (MISCELLANEOUS) ×2 IMPLANT
PAD PREP 24X41 OB/GYN DISP (PERSONAL CARE ITEMS) ×1 IMPLANT
PENCIL SMOKE EVACUATOR (MISCELLANEOUS) ×2 IMPLANT
RELOAD STAPLE 60 3.5 BLU DVNC (STAPLE) IMPLANT
RELOAD STAPLER 3.5X60 BLU DVNC (STAPLE) IMPLANT
SEAL CANN UNIV 5-8 DVNC XI (MISCELLANEOUS) ×6 IMPLANT
SEAL XI 5MM-8MM UNIVERSAL (MISCELLANEOUS) ×6
SEALER VESSEL DA VINCI XI (MISCELLANEOUS)
SEALER VESSEL EXT DVNC XI (MISCELLANEOUS) IMPLANT
SOL PREP PVP 2OZ (MISCELLANEOUS) ×2
SOLUTION ELECTROLUBE (MISCELLANEOUS) ×2 IMPLANT
SOLUTION PREP PVP 2OZ (MISCELLANEOUS) ×2 IMPLANT
SPIKE FLUID TRANSFER (MISCELLANEOUS) ×1 IMPLANT
SPONGE T-LAP 18X18 ~~LOC~~+RFID (SPONGE) ×3 IMPLANT
SPONGE T-LAP 4X18 ~~LOC~~+RFID (SPONGE) ×2 IMPLANT
STAPLER 60 DA VINCI SURE FORM (STAPLE)
STAPLER 60 SUREFORM DVNC (STAPLE) IMPLANT
STAPLER CANNULA SEAL DVNC XI (STAPLE) ×2 IMPLANT
STAPLER CANNULA SEAL XI (STAPLE) ×2
STAPLER CIRCULAR MANUAL XL 25 (STAPLE) IMPLANT
STAPLER CIRCULAR MANUAL XL 29 (STAPLE) IMPLANT
STAPLER CIRCULAR MANUAL XL 33 (STAPLE) IMPLANT
STAPLER RELOAD 3.5X60 BLU DVNC (STAPLE)
STAPLER RELOAD 3.5X60 BLUE (STAPLE)
SURGILUBE 2OZ TUBE FLIPTOP (MISCELLANEOUS) ×1 IMPLANT
SUT DVC VLOC 3-0 CL 6 P-12 (SUTURE) IMPLANT
SUT MNCRL 4-0 (SUTURE) ×2
SUT MNCRL 4-0 27 PS-2 XMFL (SUTURE) ×2
SUT MNCRL 4-0 27XMFL (SUTURE) ×2
SUT PDS AB 0 CT1 27 (SUTURE) ×2 IMPLANT
SUT SILK 2 0 (SUTURE) ×4
SUT SILK 2 0 SH (SUTURE) ×4 IMPLANT
SUT SILK 2 0SH CR/8 30 (SUTURE) IMPLANT
SUT SILK 2-0 30XBRD TIE 12 (SUTURE) ×4 IMPLANT
SUT STRATAFIX 0 PDS+ CT-2 23 (SUTURE)
SUT VIC AB 2-0 SH 27 (SUTURE)
SUT VIC AB 2-0 SH 27XBRD (SUTURE) ×2 IMPLANT
SUT VIC AB 3-0 SH 27 (SUTURE) ×8
SUT VIC AB 3-0 SH 27X BRD (SUTURE) ×4 IMPLANT
SUT VICRYL 0 AB UR-6 (SUTURE) ×1 IMPLANT
SUT VLOC 90 S/L VL9 GS22 (SUTURE) IMPLANT
SUTURE MNCRL 4-0 27XMF (SUTURE) ×3 IMPLANT
SUTURE STRATFX 0 PDS+ CT-2 23 (SUTURE) ×1 IMPLANT
SYR 20ML LL LF (SYRINGE) ×4 IMPLANT
SYR TOOMEY IRRIG 70ML (MISCELLANEOUS)
SYRINGE TOOMEY IRRIG 70ML (MISCELLANEOUS) ×1 IMPLANT
SYS LAPSCP GELPORT 120MM (MISCELLANEOUS)
SYSTEM LAPSCP GELPORT 120MM (MISCELLANEOUS) IMPLANT
TRAP FLUID SMOKE EVACUATOR (MISCELLANEOUS) ×1 IMPLANT
TRAY FOLEY MTR SLVR 16FR STAT (SET/KITS/TRAYS/PACK) ×1 IMPLANT
TRAY FOLEY SLVR 16FR LF STAT (SET/KITS/TRAYS/PACK) ×2 IMPLANT
TROCAR XCEL NON-BLD 5MMX100MML (ENDOMECHANICALS) ×1 IMPLANT
TUBING EVAC SMOKE HEATED PNEUM (TUBING) ×2 IMPLANT
WATER STERILE IRR 500ML POUR (IV SOLUTION) ×2 IMPLANT

## 2022-06-16 NOTE — Consult Note (Signed)
PHARMACY - TOTAL PARENTERAL NUTRITION CONSULT NOTE   Indication:  Intolerance to enteral feeding  Patient Measurements: Height: '5\' 11"'$  (180.3 cm) Weight: 92.6 kg (204 lb 2.3 oz) IBW/kg (Calculated) : 75.3 TPN AdjBW (KG): 80.5 Body mass index is 28.47 kg/m.  Assessment:  Patient is a 71 y/o M with medical history including Ogilvie's syndrome, HTN, HLD, COPD, CVA, GERD, gout, depression / anxiety, seizures, CAD, EtOH use disorder, CKD, diastolic CHF, colon cancer, thyroid cancer who is admitted with resolved SBO but now with worsening pseudoobstruction. GI consulted. Patient failed to respond to conservative measures with methylnaltrexone and neostigmine x 2. Patient underwent decompressive colonoscopy on 4/70 complicated by colonic distension post-procedurally. Patient underwent repeat decompressive colonoscopy on 10/3. Patient had been on tube feeds at trickle rate. Pharmacy consulted to initiate TPN to help meet nutritional goals given acute abdominal issues and intolerance to enteral nutrition.  10/10: -Elevated K and Phos. -GI unable to place PEG tube -Patient received IV contrast 10/9.  10/11: -K and Phos have normalized  10/12: -Scr back to  baseline (Scr = 1.03) -Cl elevated -Na trending up  10/13: -Labs and renal function have normalized -no acute events   Glucose / Insulin: Pre-diabetic range Hgb A1c (5.8% on 05/28/22). Last 24 hrs: 0 units insulin. Electrolytes: WNL Renal: Scr = 1.03, 10/11 250 ml documented urine output  Hepatic: ALT = 55 Intake / Output; MIVF: +6.9L for the admission. No MIVF running GI Imaging: 9/24 CT abdomen / pelvis: SBO and chronic colonic ileus or pseudo-obstruction 9/26 Abdominal X-ray: Air-filled distention of large and small bowel loops favoring ileus 9/27 CT abdomen / pelvis: No evidence of SBO 9/29 Abdominal X-ray: Severe dilatation of the colon as can be seen with pseudo-obstruction/colonic ileus 9/30 Abdominal X-ray: Significant  interval improvement in colonic distension, which has mostly resolved 10/1 Abdominal X-ray: Persistent gaseous dilation of the colon, though mildly improved compared to 06/02/2022 10/2 Abdominal X-ray: Worsened gaseous distention of the colon 10/3 Interval placement of colonic decompression tube with decreased colonic gaseous distention. 10/9 Gaseous distention of transverse and proximal descending colon with interval worsening. 10/12: abdominal x-ray: Slight improvement in moderate distention of the ascending and transverse colon. No evidence of bowel obstruction.  GI Surgeries / Procedures:  9/29: Decompressive colonoscopy 10/3: Decompressive colonoscopy  Central access: DL PICC placed 10/4 TPN start date: 10/4  Nutritional Goals: Goal TPN rate is 60 mL/hr (provides101 g of protein and 1938 kcals per day)  RD Assessment: Estimated Needs Total Energy Estimated Needs: 1900-2200kcal/day Total Protein Estimated Needs: 95-110g/day Total Fluid Estimated Needs: 1.9-2.2L/day  Current Nutrition:  NPO  Plan:  --Per discussion with RD, attempting to concentrate TPN d/t fluid restriction (hx of HF) --Continue TPN at 60 mL/hr (goal rate) Goal rate TPN will provide: Protein: 101 g (70 g/L of Clinisol 15%) Dextrose: 346 g (24%) Lipids: 36 g (36 g/L) Kcals / day: 1938 Fluids: 1.4 L --Electrolytes in TPN: Na 52mq/L, Ca 544m/L, K 40 mEq/L, Phos 12 mmol/L and Mg 5 mEq/L. Maximize acetate --Add standard MVI and trace elements to TPN and thiamine 100 mg x 3 days (completed) --Continue Moderate q6h SSI and adjust as needed  --Monitor TPN labs on Mon/Thurs   HoLorin Picket0/13/2023,7:44 AM

## 2022-06-16 NOTE — Plan of Care (Signed)

## 2022-06-16 NOTE — TOC Progression Note (Addendum)
Transition of Care [facility]) - Progression Note    Patient Details  Name: Calvin [name] MRN: [id number] Date of Birth: 1950-09-06  Transition of Care [facility]) CM/SW [place], LCSW Phone Number: 06/16/2022, 11:44 AM  Clinical Narrative:    Awaiting hospice plan. Lorayne Bender with [facility] is also following.   12:52- Per Aniceto Boss NP, palliative care to follow up Tuesday. TOC will continue to follow.    Expected Discharge Plan: [place] Barriers to Discharge: Continued Medical Work up  Expected Discharge Plan and Services Expected Discharge Plan: [place]   Discharge Planning Services: CM Consult Post Acute Care Choice: Resumption of Svcs/PTA Provider, [place] Living arrangements for the past 2 months: [place]                 DME Arranged: N/A DME Agency: NA       HH Arranged: NA HH Agency: NA         Social Determinants of Health (SDOH) Interventions    Readmission Risk Interventions    06/02/2022   12:02 PM 10/17/2021   10:47 AM  Readmission Risk Prevention Plan  Transportation Screening Complete Complete  PCP or Specialist Appt within 3-5 Days  Complete  Social Work Consult for [place] Planning/Counseling  Complete  Palliative Care Screening  Not Applicable  Medication Review Press photographer) Complete Complete  PCP or Specialist appointment within 3-5 days of discharge Complete   HRI or [place] Complete   SW Recovery Care/Counseling Consult Complete   Palliative Care Screening Complete   Skilled Nursing Facility Complete

## 2022-06-16 NOTE — Anesthesia Preprocedure Evaluation (Signed)
Anesthesia Evaluation  Patient identified by MRN, date of birth, ID band Patient awake    Reviewed: Allergy & Precautions, NPO status , Patient's Chart, lab work & pertinent test results  Airway Mallampati: III  TM Distance: >3 FB Neck ROM: full    Dental  (+) Edentulous Upper, Edentulous Lower   Pulmonary COPD, Current Smoker and Patient abstained from smoking.,  Smokes 2 cigarettes per day   Pulmonary exam normal breath sounds clear to auscultation       Cardiovascular Exercise Tolerance: Poor hypertension, (-) angina+ CAD, + Past MI and +CHF  Normal cardiovascular exam Rhythm:Regular Rate:Normal - Systolic murmurs TTE 05/3733: 1. Left ventricular ejection fraction, by estimation, is 50 to 55%. The  left ventricle has low normal function. The left ventricle has no regional  wall motion abnormalities. There is moderate left ventricular hypertrophy.  Left ventricular diastolic  parameters are indeterminate.  2. Right ventricular systolic function is normal. The right ventricular  size is normal.  3. The mitral valve is normal in structure. No evidence of mitral valve  regurgitation. No evidence of mitral stenosis.  4. The aortic valve has an indeterminant number of cusps. Aortic valve  regurgitation is mild. No aortic stenosis is present.  5. There is moderate dilatation of the aortic root, measuring 45 mm.  There is mild dilatation of the ascending aorta, measuring 41 mm.  6. The inferior vena cava is normal in size with greater than 50%  respiratory variability, suggesting right atrial pressure of 3 mmHg.   Neuro/Psych Seizures -,  PSYCHIATRIC DISORDERS Anxiety Depression Residual left sided weakness  Neuromuscular disease CVA, Residual Symptoms    GI/Hepatic negative GI ROS, Neg liver ROS, neg GERD  ,  Endo/Other  negative endocrine ROS  Renal/GU Renal disease  negative genitourinary   Musculoskeletal    Abdominal   Peds  Hematology negative hematology ROS (+)   Anesthesia Other Findings Past Medical History: No date: Alcohol abuse     Comment:  drinks on weekend No date: Anemia No date: Anxiety No date: Arthritis No date: Cancer ([place])     Comment:  colon,throat No date: COPD (chronic obstructive pulmonary disease) (HCC) No date: Coronary artery disease No date: Depression No date: Gout No date: Hypertension No date: Myocardial infarction ([place]) No date: Neuromuscular disorder ([place]) No date: Seizures ([place])     Comment:  last 6 months ago No date: Stroke [facility])     Comment:  multiple  left side weakness No date: Tremors of nervous system  Past Surgical History: 10/19/2015: CARPAL TUNNEL RELEASE; Left     Comment:  Procedure: CARPAL TUNNEL RELEASE;  Surgeon: Hessie Knows, MD;  Location: [facility] ORS;  Service: Orthopedics;                Laterality: Left; No date: COLON SURGERY 10/26/2021: COLONOSCOPY WITH PROPOFOL; N/A     Comment:  Procedure: COLONOSCOPY WITH PROPOFOL;  Surgeon:               Lesly Rubenstein, MD;  Location: [facility] ENDOSCOPY;                Service: Endoscopy;  Laterality: N/A; 06/02/2022: COLONOSCOPY WITH PROPOFOL; N/A     Comment:  Procedure: COLONOSCOPY WITH PROPOFOL;  Surgeon:               Lesly Rubenstein, MD;  Location: [facility] ENDOSCOPY;  Service: Endoscopy;  Laterality: N/A; No date: JOINT REPLACEMENT     Comment:  left partial hip  2013: THROAT SURGERY     Comment:  cancer  BMI    Body Mass Index: 29.55 kg/m      Reproductive/Obstetrics negative OB ROS                             Anesthesia Physical  Anesthesia Plan  ASA: 3  Anesthesia Plan: General   Post-op Pain Management: Ofirmev IV (intra-op)*   Induction: Intravenous and Rapid sequence  PONV Risk Score and Plan: 4 or greater and Ondansetron, Dexamethasone and Treatment may vary due to age or medical condition  Airway  Management Planned: Oral ETT  Additional Equipment: None  Intra-op Plan:   Post-operative Plan: Extubation in OR  Informed Consent: I have reviewed the patients History and Physical, chart, labs and discussed the procedure including the risks, benefits and alternatives for the proposed anesthesia with the patient or authorized representative who has indicated his/her understanding and acceptance.     Dental Advisory Given  Plan Discussed with: Anesthesiologist, CRNA and Surgeon  Anesthesia Plan Comments: (Discussed risks of anesthesia with patient, including PONV, sore throat, lip/dental/eye damage. Rare risks discussed as well, such as cardiorespiratory and neurological sequelae, aspiration, and allergic reactions. Discussed the role of CRNA in patient's perioperative care. Patient understands. Patient counseled on benefits of smoking cessation, and increased perioperative risks associated with continued smoking. )        Anesthesia Quick Evaluation

## 2022-06-16 NOTE — Progress Notes (Addendum)
Palliative:  Calvin Calvin [name], Calvin [name], is resting quietly in bed.  He appears chronically ill and frail.  He greets me, making and somewhat keeping eye contact.  He is alert and oriented x3 today, able to make his basic needs known.  A longtime family friend is present at bedside, he finishes his visit and leaves.  We talk about Calvin Calvin [name]'s acute and chronic health concerns, in particular his bowel troubles.  We talk about what surgery would look like, and I ask if he would take surgery if offered.  He tells me, "I'd have to".  I shared that if he chose to not take surgery he would qualify for hospice care.  He states, "I am not dead yet".  We also talk about CODE STATUS.  At this point his desire is to remain full scope/full code.  He is unable to set limits on CODE STATUS/life support.  Conference with attending related to patient condition, needs, goals of care.  Addendum: Conference with surgeon, bedside nursing staff, transition of care team as above.   Plan:    At this point continue full scope/full code.  Agreeable to surgery if offered.  Resident of long-term care, anticipate returning.  45 minutes  [name], NP Palliative Medicine Team Team phone [phone] Greater than 50% of this time was spent counseling and coordinating care related to the above assessment and plan.

## 2022-06-16 NOTE — Progress Notes (Addendum)
71 year old with chronic Ogilvie's recalcitrant to medical therapy he has had 2 decompressive colonoscopies neostigmine TPN and rectal tube.  None of those measures have worked.  I have had extensive discussions with the family and with the patient.  They have met individually.  I had another good discussion with daughter.  They are ready to move forward.  They understand that he is at high risk for any perioperative morbidity and mortality.  I had also  thought this through , and want  to do the best operation for him possible limiting its morbidity.  I do think that if we can perform loop colostomy and avoid a total abdominal colectomy with end ileostomy that would limit his morbidity and potential risk of mortality. We will plan to perform a robotic loop colostomy if possible I will try to avoid any G-tubes I do think that if we can get some decompression of the colon he might be okay with a potential PEG tube.  I have an extensive discussion with the patient and the family regarding his disease process and plan operation.  They are fully in agreement.  I have discussed all the risk, benefits and possible complications including death, prolonged ventilatory support, wound problems, chronic pain.  They fully understand.  I have been very honest with them and I know that bock is between a rock and a hard place and there really no good solutions for his problem .  He is today awake and alert and seems to understand his situation.  The daughter is fully aware of his situation as well as his brother.  I have also clarified with daughter and patient and he is not a Jehovah's Witness.  He is willing to take blood products in case it is needed. I have spent greater than 50 minutes in this encounter including coordination of his care, placing orders, extensive counseling and performing appropriate documentation

## 2022-06-16 NOTE — Progress Notes (Signed)
Physical Therapy Treatment Patient Details Name: Calvin [name] MRN: [id number] DOB: 12/01/1950 Today's Date: 06/16/2022   History of Present Illness Calvin [name] is a 63yoM who comes to [facility] on 05/28/22 from facility SBO. PMH: ileus, HTN, HLD, COPD, multifocal CVA c Left hemiplegia and most recent MRI showing "remote infarcts in the right occipital, lateral frontal cortex and in the left cerebellum," GERD, gout, depression, GAD, seizure, CAD, MI, ETOH abuse, CKD3a, dCHF, colon CA, thyroid CA, chronic vertebral artery occlusion, cervical spondylosis with most recent MRI showing "Multilevel cervical spondylosis, most pronounced at the C3-4 level where there is moderate-to-severe canal stenosis and severe  bilateral foraminal stenosis. Findings slightly progressed from  prior MRI."    PT Comments    Pt received supine in bed agreeable to PT services with encouragement. Pt relies on heavy assist for bed mobility but otherwise progressing in tolerance for OOB activity. No notable posterior bias in sitting performing x2 STS at RW. Initially only requiring minA+2 from lowered bed and taking R side steps. Required some increased time and minA at LLE to bring LLE to [facility]. Required seated rest and LE therex then performing additional STS with modA+2 and bed elevated due to LE weakness. Mod multimodal cuing required for glut activation for upright posture to prevent posterior leaning with good carryover. Improved ability to mobilize LLE towards [facility] with further side steps. Declining pivot transfer to recliner thus pt returning to supine in bed. Noted increased WOB indicating fatigue. Pt still below baseline mobility although progressing, and will benefit from STR to address functional mobility deficits. All needs in reach with RN at bedside.    Recommendations for follow up therapy are one component of a multi-disciplinary discharge planning process, led by the attending physician.  Recommendations may be updated  based on patient status, additional functional criteria and insurance authorization.  Follow Up Recommendations  Skilled nursing-short term rehab (<3 hours/day) Can patient physically be transported by private vehicle: No   Assistance Recommended at Discharge    Patient can return home with the following Assistance with cooking/housework;Direct supervision/assist for financial management;Assist for transportation;Direct supervision/assist for medications management;A lot of help with bathing/dressing/bathroom;Two people to help with walking and/or transfers   Equipment Recommendations  None recommended by PT    Recommendations for Other Services       Precautions / Restrictions Precautions Precautions: Fall Precaution Comments: Seizure precautions Restrictions Weight Bearing Restrictions: No     Mobility  Bed Mobility Overal bed mobility: Needs Assistance       Supine to sit: Mod assist, +2 for physical assistance Sit to supine: Mod assist, +2 for physical assistance     Patient Response: Cooperative, Flat affect  Transfers Overall transfer level: Needs assistance Equipment used: Rolling walker (2 wheels) Transfers: Sit to/from Stand Sit to Stand: Min assist, +2 physical assistance, Mod assist, From elevated surface           General transfer comment: Initially minA+2 for first STS from lowered bed. Pt then required bed elevated and modA+2 for second STS.    Ambulation/Gait Ambulation/Gait assistance: Min assist Gait Distance (Feet): 2 Feet Assistive device: Rolling walker (2 wheels) Gait Pattern/deviations: Trunk flexed, Shuffle       General Gait Details: side steps to [place]             Wheelchair Mobility    Modified Rankin (Stroke Patients Only)       Balance Overall balance assessment: Needs assistance Sitting-balance support: Bilateral upper  extremity supported, Feet supported Sitting balance-Leahy Scale: Fair Sitting balance  - Comments: maintains static sitting this date   Standing balance support: Bilateral upper extremity supported, During functional activity, Reliant on assistive device for balance Standing balance-Leahy Scale: Fair Standing balance comment: no posterior LOB. Fair standing with RW                            Cognition Arousal/Alertness: Awake/alert Behavior During Therapy: Flat affect Overall Cognitive Status: No family/caregiver present to determine baseline cognitive functioning                                 General Comments: follows commands with encouragement, increased time.        Exercises      General Comments        Pertinent Vitals/Pain Pain Assessment Pain Assessment: Faces Faces Pain Scale: Hurts a little bit Pain Location: L hand Pain Descriptors / Indicators: Sore, Aching Pain Intervention(s): Limited activity within patient's tolerance, Monitored during session    Home Living                          Prior Function            PT Goals (current goals can now be found in the care plan section) Acute Rehab PT Goals PT Goal Formulation: With patient Progress towards PT goals: Progressing toward goals    Frequency    Min 2X/week      PT Plan Current plan remains appropriate    Co-evaluation              AM-PAC PT "6 Clicks" Mobility   Outcome Measure    Help needed moving from lying on your back to sitting on the side of a flat bed without using bedrails?: A Lot Help needed moving to and from a bed to a chair (including a wheelchair)?: A Lot Help needed standing up from a chair using your arms (e.g., wheelchair or bedside chair)?: A Lot   Help needed climbing 3-5 steps with a railing? : Total 6 Click Score: 7    End of Session Equipment Utilized During Treatment: Gait belt Activity Tolerance: Patient tolerated treatment well Patient left: in bed;with call bell/phone within reach;with bed alarm  set;with family/visitor present Nurse Communication: Mobility status PT Visit Diagnosis: Muscle weakness (generalized) (M62.81);Difficulty in walking, not elsewhere classified (R26.2)     Time: 7858-8502 PT Time Calculation (min) (ACUTE ONLY): 21 min  Charges:  $Therapeutic Exercise: 8-22 mins                    Salem Caster. Fairly IV, PT, DPT Physical Therapist- [facility]  06/16/2022, 11:25 AM

## 2022-06-16 NOTE — OR Nursing (Signed)
Flatus tube/pouch in place on arrival to OR. Secured during surgery.

## 2022-06-16 NOTE — Assessment & Plan Note (Signed)
Patient is high risk surgical candidate.  Patient willing to undergo risk in order to have a chance to improve.  Dr. Dahlia Byes to take to the operating room today.

## 2022-06-16 NOTE — Anesthesia Postprocedure Evaluation (Signed)
Anesthesia Post Note  Patient: Calvin [name]  Procedure(s) Performed: XI ROBOTIC ASSISTED COLOSTOMY CREATION (Abdomen)  Patient location during evaluation: PACU Anesthesia Type: General Level of consciousness: awake and alert Pain management: pain level controlled Vital Signs Assessment: post-procedure vital signs reviewed and stable Respiratory status: spontaneous breathing, nonlabored ventilation and respiratory function stable Cardiovascular status: blood pressure returned to baseline and stable Postop Assessment: no apparent nausea or vomiting Anesthetic complications: no   No notable events documented.   Last Vitals:  Vitals:   06/16/22 1726 06/16/22 1811  BP: (!) 145/82 137/81  Pulse: 80 85  Resp: 18 18  Temp: 36.5 C 36.4 C  SpO2: 100% 100%    Last Pain:  Vitals:   06/16/22 1811  TempSrc: Oral  PainSc:                  [place] Ouch

## 2022-06-16 NOTE — Progress Notes (Addendum)
SLP Cancellation Note  Patient Details Name: Calvin [name] MRN: [id number] DOB: 05/15/1951   Cancelled treatment:       Reason Eval/Treat Not Completed: Patient at procedure or test/unavailable (chart reviewed; MD updated Team on POC. Pt is scheduled for a Procedure today. MD requested holding on MBSS. ST services will f/u w/ MBSS next week if any needs then.) Team and Dietician are discussing PEG placement for support.       Orinda Kenner, MS, CCC-SLP Speech Language Pathologist Rehab Services; [place] [phone] (ascom) Kearstin Learn 06/16/2022, 11:46 AM

## 2022-06-16 NOTE — Op Note (Signed)
PROCEDURES: 1. 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 with endotracheal tube   Findings: Dilated Transverse and ascending colon. Decompressed descending and sigmoid colon No evidence of perforation  Estimated Blood Loss: 10cc                      Complications: none         Condition: stable  Procedure Details  The patient was seen again in the Holding Room. The benefits, complications, treatment options, and expected outcomes were discussed with the patient. The risks of bleeding, infection, recurrence of symptoms, failure to resolve symptoms, , bowel injury, any of which could require further surgery were reviewed with the patient.   The patient was taken to Operating Room, identified  and the procedure verified.  A Time Out was held and the above information confirmed.  Prior to the induction of general anesthesia, antibiotic prophylaxis was administered. VTE prophylaxis was in place. General endotracheal anesthesia was then administered and tolerated well. After the induction, the abdomen was prepped with Chloraprep and draped in the sterile fashion. The patient was positioned in a supine  position. Incision created at Palmer's point with 15 blade, Veres needle inserted with appropriate position and appropriate saline test.  CO2 insuflation performed w/o hemodynamic changes.  Four 8 mm robotic ports were placed under direct visualization and one of them using optiview technique. No evidence of injuries observed and Veres needle was well positioned. Patient was positioned in steep trendelenburg and left side up. Robot was brought to the field and docked in the standard fashion. WE maintained visualization of our instruments at  all times and avoided any collision between arms. I scrubbed out and went to the console. There were dense adhesions from sigmoid to the abdominal wall that where lysed in the standard fashion with the scissors.  Incised the white line of Toldt, the sigmoid colon was mobilized.  We noticed that the descending colon was normal.  Attention then was turned to the upper quadrant where we saw very dilated transverse colon up to 12 cm.  Although anatomically and physiologically loop sigmoid colostomy superior in this specific case his pathology indicated that the decompression needed to be proximal to the splenic flexure.  For this reason I change my mind and I turned my attention to the upper quadrant tube perform a mobilization of the transverse colon to achieve a tension-free loop colostomy.  Greater omentum was divided and that gave [place] enough mobility for the transverse colon to reach the abdominal wall. A 2-0 silk suture was placed in the transverse colon and using a PMI were able to exteriorize this stitch along with the transverse colon in a spot created in the left upper quadrant.  All the laparoscopic ports were removed and a second look showed no evidence of any bleeding or any other injuries.  Liposomal marcaine was infiltrated at all incision sites in a full thickness fashion. The skin incisions were closed with 4-0 Monocryl. Dermabond was used to coat all the skin incisions.   Patient then was turned to the left upper quadrant where an incision was created with a 15 blade knife.  Electrocautery was used to dissect through subcutaneous tissue and the anterior and posterior rectus sheath was incised.  I was able to  locate the silk suture and brought the transverse colon via the defect in the abdominal wall.  I was able to [facility] the transverse colostomy in a standard [place] fashion.  Ostomy appliance placed.  I was very happy with the creation of the colostomy and with its location.   Needle and  laparotomy count were correct and there were no immediate complications.  Caroleen Hamman, MD, FACS

## 2022-06-16 NOTE — Transfer of Care (Signed)
Immediate Anesthesia Transfer of Care Note  Patient: Calvin [name]  Procedure(s) Performed: XI ROBOTIC ASSISTED COLOSTOMY CREATION (Abdomen)  Patient Location: PACU  Anesthesia Type:General  Level of Consciousness: sedated  Airway & Oxygen Therapy: Patient Spontanous Breathing and Patient connected to face mask oxygen  Post-op Assessment: Report given to RN and Post -op Vital signs reviewed and stable  Post vital signs: Reviewed and stable  Last Vitals:  Vitals Value Taken Time  BP 152/83 06/16/22 1618  Temp    Pulse 81 06/16/22 1619  Resp 17 06/16/22 1619  SpO2 100 % 06/16/22 1619  Vitals shown include unvalidated device data.  Last Pain:  Vitals:   06/16/22 1250  TempSrc: Temporal  PainSc:       Patients Stated Pain Goal: 3 (23/53/61 4431)  Complications: No notable events documented.

## 2022-06-16 NOTE — Progress Notes (Signed)
Progress Note   Patient: Calvin [name] PNT:[id number] DOB: Feb 01, 1951 DOA: 05/28/2022     19 DOS: the patient was seen and examined on 06/16/2022   Brief hospital course:  Calvin [name] is a 71 y.o. male with medical history significant of Ogilvie's syndrome, ileus, hypertension, hyperlipidemia, COPD, stroke with mild left-sided weakness, GERD, gout, depression with anxiety, seizure, CAD, MI,  alcohol abuse, CKD stage IIIa, dCHF, colon cancer, thyroid cancer, who presents from his facility with Nausea, vomiting, abdominal distention, abdominal pain for 2 days. Found to have new small bowel obstruction on CT abdomen with transition point in the right lower abdomen suspicious for adhesion. General surgery was consulted and NG tube was placed.  Patient also developed left-sided tingling and numbness and facial droop while in ED. CT head and MRI brain was negative for any acute intracranial abnormality and do reflect a chronic changes. 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  MRI of the cervical spine showed C3-C4 moderate to severe spinal stenosis, seen by neurosurgery, recommended outpatient follow-up.  9/25: Continued to have significant colonic distention.  NG suction continued. The patient was transferred to the stepdown unit for neostigmine dosing on 05/31/2022 and 06/01/2022.   Patient had a colonoscopy decompression on 9/29 and 10/3, followed with rectal tube. Tube feeding started on 10/30-10/3.  10/4.  Modified barium swallow showed a significant risk of aspiration.  TPN started after PICC line placed.  10/10.  Patient currently on TPN, GI cannot place PEG tube due to worsening due to abdominal distention.  Patient will need a total colectomy in order to survive.  Dr. Roosevelt Locks spoke with Dr. Doyne Keel from [facility] and he did not believe that he was a good surgical candidate and can touch back with them in a week if not improved.  10/12.  Patient considering major surgical  intervention requiring colectomy and ileostomy and feeding tube.    10/13. Dr Dahlia Byes to take to the OR.  Assessment and Plan: * Pre-op evaluation Patient is high risk surgical candidate.  Patient willing to undergo risk in order to have a chance to improve.  Dr. Dahlia Byes to take to the operating room today.  Ogilvie's syndrome General surgery does not think this is bowel obstruction.  Gastroenterology gave neostigmine infusion on 05/31/2022 and on 06/01/22.  Decompressive colonoscopy procedure on 06/02/2022 and on 06/06/2022 and left a rectal tube in. Patient did not do well with barium swallow exam.  Patient currently on TPN.  Patient's abdomen is less distended than last week but still has rectal tube and draining the air out.  Dr. Dahlia Byes to take to the operating room today.  Paroxysmal atrial flutter (HCC) Currently rate controlled.  On as needed IV metoprolol.  Lovenox discontinued.  Cervical spinal stenosis MRI showing multilevel cervical spondylosis most pronounced at C3-C4 where moderate to severe canal stenosis and severe bilateral foraminal stenosis.   Respiratory distress Respiratory distress on 06/01/2022 with upper airway congestion.  Improved with Decadron and racemic epinephrine infusion.  Currently not on oxygen.  Chronic diastolic CHF (congestive heart failure) (HCC) -Last EF 50% -Hold Lasix   Seizures (HCC) Seizure -Seizure precaution -When necessary Ativan for seizure -IV Keppra and Depakote.    Aspiration pneumonia ([place]) Completed 5 days of Zosyn on 06/05/2022.  HTN (hypertension) NG tube out and failed swallow eval.  As needed IV metoprolol.  CAD (coronary artery disease) No chest pain.  Troponin negative x2   HLD (hyperlipidemia) Hold Zocor  Chronic kidney disease, stage  3a ([place]) Last creatinine 0.9 with a GFR greater than 60  COPD (chronic obstructive pulmonary disease) (HCC) Continue nebulizers  Coffee ground emesis Last hemoglobin 11.6.  Tobacco  abuse -nicotine patch  Depression with anxiety Hold home oral Xanax and Zoloft -As needed IV Ativan for anxiety  Dysphagia Did not do well with swallow evaluation.  Neuropathy Continue gabapentin        Subjective: Patient not feeling well.  Asking questions about his prognosis.  When I saw him this morning he was thinking about surgery.  Physical Exam: Vitals:   06/15/22 0727 06/15/22 1511 06/16/22 0046 06/16/22 0807  BP: 128/72 138/66 123/69 119/72  Pulse: 80 87 82 80  Resp: '14 12 20 18  '$ Temp: 97.9 F (36.6 C) 98.2 F (36.8 C) 98.2 F (36.8 C) 98.5 F (36.9 C)  TempSrc: Oral Oral Oral Oral  SpO2: 99% 100% 99% 100%  Weight:      Height:       Physical Exam HENT:     Head: Normocephalic.     Mouth/Throat:     Pharynx: No oropharyngeal exudate.  Eyes:     General: Lids are normal.     Conjunctiva/sclera: Conjunctivae normal.  Cardiovascular:     Rate and Rhythm: Normal rate and regular rhythm.     Heart sounds: Normal heart sounds, S1 normal and S2 normal.  Pulmonary:     Breath sounds: No decreased breath sounds, wheezing, rhonchi or rales.  Abdominal:     General: There is distension.     Palpations: Abdomen is soft.     Tenderness: There is no abdominal tenderness.  Musculoskeletal:     Right lower leg: Swelling present.     Left lower leg: Swelling present.  Skin:    General: Skin is warm.     Findings: No rash.  Neurological:     Mental Status: He is alert.     Data Reviewed: Creatinine 0.9  Family Communication: Dr. Dahlia Byes spoke with family today about surgery  Disposition: Status is: Inpatient Remains inpatient appropriate because: Dr. Dahlia Byes to take to the operating room today  Planned Discharge Destination: Home    Time spent: 27 minutes Case discussed with general surgery, speech pathology and palliative care  Author: Loletha Grayer, MD 06/16/2022 12:03 PM  For on call review [email or web address].

## 2022-06-16 NOTE — Anesthesia Procedure Notes (Addendum)
Procedure Name: Intubation Date/Time: 06/16/2022 1:40 PM  Performed by: Hilbert Odor, CRNAPre-anesthesia Checklist: Patient identified, Patient being monitored, Timeout performed, Emergency Drugs available and Suction available Patient Re-evaluated:Patient Re-evaluated prior to induction Oxygen Delivery Method: Circle system utilized Preoxygenation: Pre-oxygenation with 100% oxygen Induction Type: IV induction Ventilation: Mask ventilation without difficulty Laryngoscope Size: Mac and 3 Grade View: Grade I Tube type: Oral Tube size: 7.0 mm Number of attempts: 1 Airway Equipment and Method: Stylet Placement Confirmation: ETT inserted through vocal cords under direct vision, positive ETCO2 and breath sounds checked- equal and bilateral Secured at: 21 cm Tube secured with: Tape Dental Injury: Teeth and Oropharynx as per pre-operative assessment

## 2022-06-17 DIAGNOSIS — M4802 Spinal stenosis, cervical region: Secondary | ICD-10-CM | POA: Diagnosis not present

## 2022-06-17 DIAGNOSIS — K5981 Ogilvie syndrome: Secondary | ICD-10-CM | POA: Diagnosis not present

## 2022-06-17 DIAGNOSIS — N189 Chronic kidney disease, unspecified: Secondary | ICD-10-CM

## 2022-06-17 DIAGNOSIS — I4892 Unspecified atrial flutter: Secondary | ICD-10-CM | POA: Diagnosis not present

## 2022-06-17 DIAGNOSIS — N179 Acute kidney failure, unspecified: Secondary | ICD-10-CM

## 2022-06-17 LAB — COMPREHENSIVE METABOLIC PANEL
ALT: 39 U/L (ref 0–44)
AST: 23 U/L (ref 15–41)
Albumin: 2.9 g/dL — ABNORMAL LOW (ref 3.5–5.0)
Alkaline Phosphatase: 63 U/L (ref 38–126)
Anion gap: 6 (ref 5–15)
BUN: 38 mg/dL — ABNORMAL HIGH (ref 8–23)
CO2: 27 mmol/L (ref 22–32)
Calcium: 8.3 mg/dL — ABNORMAL LOW (ref 8.9–10.3)
Chloride: 106 mmol/L (ref 98–111)
Creatinine, Ser: 1.47 mg/dL — ABNORMAL HIGH (ref 0.61–1.24)
GFR, Estimated: 50 mL/min — ABNORMAL LOW (ref >60)
Glucose, Bld: 122 mg/dL — ABNORMAL HIGH (ref 70–99)
Potassium: 4.6 mmol/L (ref 3.5–5.1)
Sodium: 139 mmol/L (ref 135–145)
Total Bilirubin: 0.5 mg/dL (ref 0.3–1.2)
Total Protein: 6.1 g/dL — ABNORMAL LOW (ref 6.5–8.1)

## 2022-06-17 LAB — CBC
HCT: 35.8 % — ABNORMAL LOW (ref 39.0–52.0)
Hemoglobin: 11.1 g/dL — ABNORMAL LOW (ref 13.0–17.0)
MCH: 30.1 pg (ref 26.0–34.0)
MCHC: 31 g/dL (ref 30.0–36.0)
MCV: 97 fL (ref 80.0–100.0)
Platelets: 232 10*3/uL (ref 150–400)
RBC: 3.69 MIL/uL — ABNORMAL LOW (ref 4.22–5.81)
RDW: 12.5 % (ref 11.5–15.5)
WBC: 11.2 10*3/uL — ABNORMAL HIGH (ref 4.0–10.5)
nRBC: 0 % (ref 0.0–0.2)

## 2022-06-17 LAB — GLUCOSE, CAPILLARY
Glucose-Capillary: 150 mg/dL — ABNORMAL HIGH (ref 70–99)
Glucose-Capillary: 132 mg/dL — ABNORMAL HIGH (ref 70–99)
Glucose-Capillary: 126 mg/dL — ABNORMAL HIGH (ref 70–99)
Glucose-Capillary: 110 mg/dL — ABNORMAL HIGH (ref 70–99)

## 2022-06-17 LAB — PHOSPHORUS: Phosphorus: 3.6 mg/dL (ref 2.5–4.6)

## 2022-06-17 LAB — MAGNESIUM: Magnesium: 2 mg/dL (ref 1.7–2.4)

## 2022-06-17 MED ORDER — TRACE MINERALS CU-MN-SE-ZN 300-55-60-3000 MCG/ML IV SOLN
INTRAVENOUS | Status: AC
  Filled 2022-06-17: qty 672

## 2022-06-17 MED ORDER — SODIUM CHLORIDE 0.9 % IV BOLUS
250.0000 mL | Freq: Once | INTRAVENOUS | Status: AC
  Administered 2022-06-17: 250 mL via INTRAVENOUS

## 2022-06-17 MED ORDER — ACETAMINOPHEN 10 MG/ML IV SOLN
1000.0000 mg | Freq: Four times a day (QID) | INTRAVENOUS | Status: AC | PRN
  Administered 2022-06-17 – 2022-06-18 (×2): 1000 mg via INTRAVENOUS
  Filled 2022-06-17 (×3): qty 100

## 2022-06-17 NOTE — Assessment & Plan Note (Addendum)
AKI on CKD stage IIIa.  Creatinine 1.48 today.  Creatinine was 0.90 a few days ago.  Continue TPN.

## 2022-06-17 NOTE — Progress Notes (Signed)
Progress Note   Patient: Calvin [name] DGL:[id number] DOB: June 27, 1951 DOA: 05/28/2022     20 DOS: the patient was seen and examined on 06/17/2022   Brief hospital course:  Calvin [name] is a 71 y.o. male with medical history significant of Ogilvie's syndrome, ileus, hypertension, hyperlipidemia, COPD, stroke with mild left-sided weakness, GERD, gout, depression with anxiety, seizure, CAD, MI,  alcohol abuse, CKD stage IIIa, dCHF, colon cancer, thyroid cancer, who presents from his facility with Nausea, vomiting, abdominal distention, abdominal pain for 2 days. Found to have new small bowel obstruction on CT abdomen with transition point in the right lower abdomen suspicious for adhesion. General surgery was consulted and NG tube was placed.  Patient also developed left-sided tingling and numbness and facial droop while in ED. CT head and MRI brain was negative for any acute intracranial abnormality and do reflect a chronic changes. 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  MRI of the cervical spine showed C3-C4 moderate to severe spinal stenosis, seen by neurosurgery, recommended outpatient follow-up.  9/25: Continued to have significant colonic distention.  NG suction continued. The patient was transferred to the stepdown unit for neostigmine dosing on 05/31/2022 and 06/01/2022.   Patient had a colonoscopy decompression on 9/29 and 10/3, followed with rectal tube. Tube feeding started on 10/30-10/3.  10/4.  Modified barium swallow showed a significant risk of aspiration.  TPN started after PICC line placed.  10/10.  Patient currently on TPN, GI cannot place PEG tube due to worsening due to abdominal distention.  Patient will need a total colectomy in order to survive.  Dr. Roosevelt Locks spoke with Dr. Doyne Keel from [facility] and he did not believe that he was a good surgical candidate and can touch back with them in a week if not improved.  10/12.  Patient considering major surgical  intervention requiring colectomy and ileostomy and feeding tube.    10/13. Dr Dahlia Byes took to the OR for robotic assisted laparoscopic transverse loop colostomy creation.  Assessment and Plan: * Ogilvie syndrome General surgery does not think this is bowel obstruction.  Gastroenterology gave neostigmine infusion on 05/31/2022 and on 06/01/22.  Decompressive colonoscopy procedure on 06/02/2022 and on 06/06/2022 and left a rectal tube in. Patient did not do well with barium swallow exam.  Patient currently on TPN.  Dr. Adora Fridge took to the operating room for robotic assisted transverse colon diverting colostomy on 06/16/2022.  Currently NPO.  Will have a swallow evaluation with speech pathology this week coming up.  May end up needing a PEG.  AKI (acute kidney injury) ([place]) AKI on CKD stage IIIa.  Creatinine 1.47 today.  Creatinine was 0.90 yesterday.  We will give a fluid bolus.  Continue TPN.  Paroxysmal atrial flutter (HCC) Currently rate controlled.  On as needed IV metoprolol.  Lovenox discontinued.  Cervical spinal stenosis MRI showing multilevel cervical spondylosis most pronounced at C3-C4 where moderate to severe canal stenosis and severe bilateral foraminal stenosis.   Chronic diastolic CHF (congestive heart failure) (HCC) -Last EF 50% -Hold Lasix   Seizures (HCC) Seizure -Seizure precaution -When necessary Ativan for seizure -IV Keppra and Depakote.    Aspiration pneumonia ([place]) Completed 5 days of Zosyn on 06/05/2022.  HTN (hypertension) NG tube out and failed swallow eval.  As needed IV metoprolol.  Respiratory distress Respiratory distress on 06/01/2022 with upper airway congestion.  Improved with Decadron and racemic epinephrine infusion.  Currently not on oxygen.  CAD (coronary artery disease) No chest pain.  Troponin negative x2   HLD (hyperlipidemia) Hold Zocor  Chronic kidney disease, stage 3a ([place]) Renal function stable.  GFR> 60. -Follow-up by BMP  COPD  (chronic obstructive pulmonary disease) (HCC) Continue nebulizers  Coffee ground emesis Last hemoglobin 11.6.  Tobacco abuse -nicotine patch  Depression with anxiety Hold home oral Xanax and Zoloft -As needed IV Ativan for anxiety  Dysphagia Did not do well with swallow evaluation.  Neuropathy Will need to be restarted on gabapentin once able to swallow        Subjective: Patient seen this morning and not feeling well.  Had surgical procedure diverting colon to a colostomy.  Physical Exam: Vitals:   06/17/22 0438 06/17/22 0500 06/17/22 0817 06/17/22 1003  BP: 94/73  102/65   Pulse: 80  80 80  Resp: '16  18 18  '$ Temp: 98.3 F (36.8 C)  97.8 F (36.6 C)   TempSrc: Oral  Oral   SpO2: 100%  98% 97%  Weight:  97.8 kg    Height:       Physical Exam HENT:     Head: Normocephalic.     Mouth/Throat:     Pharynx: No oropharyngeal exudate.  Eyes:     General: Lids are normal.     Conjunctiva/sclera: Conjunctivae normal.  Cardiovascular:     Rate and Rhythm: Normal rate and regular rhythm.     Heart sounds: Normal heart sounds, S1 normal and S2 normal.  Pulmonary:     Breath sounds: No decreased breath sounds, wheezing, rhonchi or rales.  Abdominal:     Palpations: Abdomen is soft.     Tenderness: There is generalized abdominal tenderness.  Musculoskeletal:     Right lower leg: Swelling present.     Left lower leg: Swelling present.  Skin:    General: Skin is warm.     Findings: No rash.  Neurological:     Mental Status: He is alert.     Data Reviewed: Creatinine up to 1.47, hemoglobin 11.1, white blood cell count 11.2  Family Communication: Updated patient's daughter on the phone  Disposition: Status is: Inpatient Remains inpatient appropriate because: Postoperative day 1 from robotic assisted laparoscopic transverse loop colostomy creation.  Planned Discharge Destination: Rehab    Time spent: 27 minutes  Author: Loletha Grayer, MD 06/17/2022 2:06  PM  For on call review [email or web address].

## 2022-06-17 NOTE — Consult Note (Signed)
[facility] Nurse Consult Note: Reason for Consult:New transverse colostomy performed by Dr. Dahlia Byes on 10/13.  WOC Nursing is in receipt of consult and will see patient within the next 24-48 hours for pouch change, stoma assessment and initiation of patient education.  [place] nursing team will follow, and will remain available to this patient, the nursing, surgical and medical teams.    Thank you for inviting [place] to participate in this patient's Plan of Care.  Maudie Flakes, MSN, RN, CNS, [place], Serita Grammes, [employer], [employer] phone:  [phone]

## 2022-06-17 NOTE — Progress Notes (Signed)
Patient is alert and oriented x2. Denied pain. . Colostomy has small amount of bloody output. Slept all throughout the night after giving night meds. No additional needs voiced.

## 2022-06-17 NOTE — Consult Note (Signed)
PHARMACY - TOTAL PARENTERAL NUTRITION CONSULT NOTE   Indication:  Intolerance to enteral feeding  Patient Measurements: Height: '5\' 11"'$  (180.3 cm) Weight: 97.8 kg (215 lb 11.2 oz) IBW/kg (Calculated) : 75.3 TPN AdjBW (KG): 92.6 Body mass index is 30.08 kg/m.  Assessment:  Patient is a 71 y/o M with medical history including Ogilvie's syndrome, HTN, HLD, COPD, CVA, GERD, gout, depression / anxiety, seizures, CAD, EtOH use disorder, CKD, diastolic CHF, colon cancer, thyroid cancer who is admitted with resolved SBO but now with worsening pseudoobstruction. GI consulted. Patient failed to respond to conservative measures with methylnaltrexone and neostigmine x 2. Patient underwent decompressive colonoscopy on 6/07 complicated by colonic distension post-procedurally. Patient underwent repeat decompressive colonoscopy on 10/3. Patient had been on tube feeds at trickle rate. Pharmacy consulted to initiate TPN to help meet nutritional goals given acute abdominal issues and intolerance to enteral nutrition.  10/10: -Elevated K and Phos. -GI unable to place PEG tube -Patient received IV contrast 10/9.  10/11: -K and Phos have normalized  10/12: -Scr back to  baseline (Scr = 1.03) -Cl elevated -Na trending up  10/13: -Labs and renal function have normalized -no acute events   Glucose / Insulin: Pre-diabetic range Hgb A1c (5.8% on 05/28/22). Last 24 hrs: 6 units insulin SSI   (received dexamethasone 4 mg IV x 1 10/13. Electrolytes: WNL Renal: Scr 0.90> 1.47 Hepatic: WNL Intake / Output; MIVF: +6.9L for the admission. No MIVF running GI Imaging: 9/24 CT abdomen / pelvis: SBO and chronic colonic ileus or pseudo-obstruction 9/26 Abdominal X-ray: Air-filled distention of large and small bowel loops favoring ileus 9/27 CT abdomen / pelvis: No evidence of SBO 9/29 Abdominal X-ray: Severe dilatation of the colon as can be seen with pseudo-obstruction/colonic ileus 9/30 Abdominal X-ray:  Significant interval improvement in colonic distension, which has mostly resolved 10/1 Abdominal X-ray: Persistent gaseous dilation of the colon, though mildly improved compared to 06/02/2022 10/2 Abdominal X-ray: Worsened gaseous distention of the colon 10/3 Interval placement of colonic decompression tube with decreased colonic gaseous distention. 10/9 Gaseous distention of transverse and proximal descending colon with interval worsening. 10/12: abdominal x-ray: Slight improvement in moderate distention of the ascending and transverse colon. No evidence of bowel obstruction.  GI Surgeries / Procedures:  9/29: Decompressive colonoscopy 10/3: Decompressive colonoscopy  Central access: DL PICC placed 10/4 TPN start date: 10/4  Nutritional Goals: Goal TPN rate is 60 mL/hr (provides101 g of protein and 1938 kcals per day)  RD Assessment: Estimated Needs Total Energy Estimated Needs: 1900-2200kcal/day Total Protein Estimated Needs: 95-110g/day Total Fluid Estimated Needs: 1.9-2.2L/day  Current Nutrition:  NPO  Plan:  --Per discussion with RD, attempting to concentrate TPN d/t fluid restriction (hx of HF) --10/14: Scr increase 0.90>>1.47- watch --Continue TPN at 60 mL/hr (goal rate) Goal rate TPN will provide: Protein: 101 g (70 g/L of Clinisol 15%) Dextrose: 346 g (24%) Lipids: 36 g (36 g/L) Kcals / day: 1938 Fluids: 1.4 L --Electrolytes in TPN: Na 55mq/L, Ca 564m/L, Decreased K from 40 to 35 mEq/L on 10/14, Phos 12 mmol/L and Mg 5 mEq/L. Maximize acetate --Add standard MVI and trace elements to TPN and thiamine 100 mg x 3 days (completed) --Continue Moderate q6h SSI and adjust as needed  --Monitor TPN labs on Mon/Thurs   Ali Mohl A 06/17/2022,11:55 AM

## 2022-06-17 NOTE — Progress Notes (Signed)
Subjective:  CC: Calvin [name] is a 71 y.o. male  Hospital stay day 89, 1 Day Post-Op robotic assisted laparoscopic transverse loop colostomy creation  HPI: No acute issues reported overnight.  ROS:  Unable to obtain secondary to patient baseline mental status.  Objective:   Temp:  [97.4 F (36.3 C)-98.3 F (36.8 C)] 97.8 F (36.6 C) (10/14 0817) Pulse Rate:  [80-96] 80 (10/14 1003) Resp:  [12-20] 18 (10/14 1003) BP: (94-152)/(65-84) 102/65 (10/14 0817) SpO2:  [94 %-100 %] 97 % (10/14 1003) Weight:  [92.6 kg-97.8 kg] 97.8 kg (10/14 0500)     Height: '5\' 11"'$  (180.3 cm) Weight: 97.8 kg BMI (Calculated): 30.1   Intake/Output this shift:   Intake/Output Summary (Last 24 hours) at 06/17/2022 1213 Last data filed at 06/17/2022 7510 Gross per 24 hour  Intake 1745.61 ml  Output 610 ml  Net 1135.61 ml    Constitutional :  alert, cooperative, appears stated age, and no distress  Respiratory:  clear to auscultation bilaterally  Cardiovascular:  regular rate and rhythm  Gastrointestinal: Soft, no guarding, focal tenderness to palpation around ostomy site.  Nondistended. .  Ostomy mucosa pink, swollen as expected.  No output within bag  Skin: Cool and moist.   Psychiatric: Normal affect, non-agitated, not confused       LABS:     Latest Ref Rng & Units 06/17/2022   10:20 AM 06/16/2022    5:50 AM 06/15/2022    1:56 AM  CMP  Glucose 70 - 99 mg/dL 122  115  111   BUN 8 - 23 mg/dL 38  24  29   Creatinine 0.61 - 1.24 mg/dL 1.47  0.90  1.03   Sodium 135 - 145 mmol/L 139  143  144   Potassium 3.5 - 5.1 mmol/L 4.6  4.2  4.0   Chloride 98 - 111 mmol/L 106  110  115   CO2 22 - 32 mmol/L '27  26  24   '$ Calcium 8.9 - 10.3 mg/dL 8.3  8.9  9.0   Total Protein 6.5 - 8.1 g/dL 6.1   6.7   Total Bilirubin 0.3 - 1.2 mg/dL 0.5   0.5   Alkaline Phos 38 - 126 U/L 63   57   AST 15 - 41 U/L 23   34   ALT 0 - 44 U/L 39   55       Latest Ref Rng & Units 06/17/2022   10:20 AM 06/15/2022    1:56  AM 06/13/2022   10:45 AM  CBC  WBC 4.0 - 10.5 K/uL 11.2  6.3  5.9   Hemoglobin 13.0 - 17.0 g/dL 11.1  11.9  11.6   Hematocrit 39.0 - 52.0 % 35.8  38.7  42.5   Platelets 150 - 400 K/uL 232  268  264     RADS: N/a Assessment:   71 y.o. male with chronic colonic distension/Ogilvie's s/p second decompressive colonoscopy on 25/85, complicated by significant comorbid disease, physical deconditioning, overall poor functional status.  Status post robotic assisted laparoscopic transverse loop colostomy creation.  As expected at this point.  Continue supportive care until return of bowel function.  labs/images/medications/previous chart entries reviewed personally and relevant changes/updates noted above.

## 2022-06-18 DIAGNOSIS — R519 Headache, unspecified: Secondary | ICD-10-CM

## 2022-06-18 DIAGNOSIS — I4892 Unspecified atrial flutter: Secondary | ICD-10-CM | POA: Diagnosis not present

## 2022-06-18 DIAGNOSIS — K5981 Ogilvie syndrome: Secondary | ICD-10-CM | POA: Diagnosis not present

## 2022-06-18 DIAGNOSIS — M4802 Spinal stenosis, cervical region: Secondary | ICD-10-CM | POA: Diagnosis not present

## 2022-06-18 DIAGNOSIS — N179 Acute kidney failure, unspecified: Secondary | ICD-10-CM | POA: Diagnosis not present

## 2022-06-18 LAB — TYPE AND SCREEN
ABO/RH(D): B POS
Antibody Screen: NEGATIVE
Unit division: 0
Unit division: 0

## 2022-06-18 LAB — BPAM RBC
Blood Product Expiration Date: 202310312359
Blood Product Expiration Date: 202311032359
Unit Type and Rh: 7300
Unit Type and Rh: 7300

## 2022-06-18 LAB — CBC
HCT: 32.8 % — ABNORMAL LOW (ref 39.0–52.0)
Hemoglobin: 10.3 g/dL — ABNORMAL LOW (ref 13.0–17.0)
MCH: 30.9 pg (ref 26.0–34.0)
MCHC: 31.4 g/dL (ref 30.0–36.0)
MCV: 98.5 fL (ref 80.0–100.0)
Platelets: 208 10*3/uL (ref 150–400)
RBC: 3.33 MIL/uL — ABNORMAL LOW (ref 4.22–5.81)
RDW: 12.7 % (ref 11.5–15.5)
WBC: 7.6 10*3/uL (ref 4.0–10.5)
nRBC: 0 % (ref 0.0–0.2)

## 2022-06-18 LAB — BASIC METABOLIC PANEL
Anion gap: 8 (ref 5–15)
BUN: 43 mg/dL — ABNORMAL HIGH (ref 8–23)
CO2: 27 mmol/L (ref 22–32)
Calcium: 8.4 mg/dL — ABNORMAL LOW (ref 8.9–10.3)
Chloride: 107 mmol/L (ref 98–111)
Creatinine, Ser: 1.47 mg/dL — ABNORMAL HIGH (ref 0.61–1.24)
GFR, Estimated: 50 mL/min — ABNORMAL LOW (ref >60)
Glucose, Bld: 123 mg/dL — ABNORMAL HIGH (ref 70–99)
Potassium: 4.2 mmol/L (ref 3.5–5.1)
Sodium: 142 mmol/L (ref 135–145)

## 2022-06-18 LAB — GLUCOSE, CAPILLARY
Glucose-Capillary: 110 mg/dL — ABNORMAL HIGH (ref 70–99)
Glucose-Capillary: 111 mg/dL — ABNORMAL HIGH (ref 70–99)
Glucose-Capillary: 119 mg/dL — ABNORMAL HIGH (ref 70–99)
Glucose-Capillary: 124 mg/dL — ABNORMAL HIGH (ref 70–99)
Glucose-Capillary: 91 mg/dL (ref 70–99)

## 2022-06-18 LAB — PREPARE RBC (CROSSMATCH)

## 2022-06-18 MED ORDER — SODIUM CHLORIDE 0.9 % IV BOLUS
250.0000 mL | Freq: Once | INTRAVENOUS | Status: AC
  Administered 2022-06-18: 250 mL via INTRAVENOUS

## 2022-06-18 MED ORDER — TRACE MINERALS CU-MN-SE-ZN 300-55-60-3000 MCG/ML IV SOLN
INTRAVENOUS | Status: AC
  Filled 2022-06-18: qty 672

## 2022-06-18 MED ORDER — MAGNESIUM SULFATE 2 GM/50ML IV SOLN
2.0000 g | Freq: Once | INTRAVENOUS | Status: AC
  Administered 2022-06-18: 2 g via INTRAVENOUS
  Filled 2022-06-18: qty 50

## 2022-06-18 NOTE — Assessment & Plan Note (Deleted)
IV magnesium

## 2022-06-18 NOTE — Consult Note (Signed)
PHARMACY - TOTAL PARENTERAL NUTRITION CONSULT NOTE   Indication:  Intolerance to enteral feeding  Patient Measurements: Height: '5\' 11"'$  (180.3 cm) Weight: 97.5 kg (215 lb) IBW/kg (Calculated) : 75.3 TPN AdjBW (KG): 92.6 Body mass index is 29.99 kg/m.  Assessment:  Patient is a 71 y/o M with medical history including Ogilvie's syndrome, HTN, HLD, COPD, CVA, GERD, gout, depression / anxiety, seizures, CAD, EtOH use disorder, CKD, diastolic CHF, colon cancer, thyroid cancer who is admitted with resolved SBO but now with worsening pseudoobstruction. GI consulted. Patient failed to respond to conservative measures with methylnaltrexone and neostigmine x 2. Patient underwent decompressive colonoscopy on 5/28 complicated by colonic distension post-procedurally. Patient underwent repeat decompressive colonoscopy on 10/3. Patient had been on tube feeds at trickle rate. Pharmacy consulted to initiate TPN to help meet nutritional goals given acute abdominal issues and intolerance to enteral nutrition.  10/10: -Elevated K and Phos. -GI unable to place PEG tube -Patient received IV contrast 10/9.  10/11: -K and Phos have normalized  10/12: -Scr back to  baseline (Scr = 1.03) -Cl elevated -Na trending up  10/13: -Labs and renal function have normalized -no acute events   Glucose / Insulin: Pre-diabetic range Hgb A1c (5.8% on 05/28/22). Last 24 hrs: 1 units insulin SSI    (received dexamethasone 4 mg IV x 1 10/13. Electrolytes: WNL Renal: Scr 0.90> 1.47> 1.47 Hepatic: WNL Intake / Output; MIVF: +6.9L for the admission. No MIVF running GI Imaging: 9/24 CT abdomen / pelvis: SBO and chronic colonic ileus or pseudo-obstruction 9/26 Abdominal X-ray: Air-filled distention of large and small bowel loops favoring ileus 9/27 CT abdomen / pelvis: No evidence of SBO 9/29 Abdominal X-ray: Severe dilatation of the colon as can be seen with pseudo-obstruction/colonic ileus 9/30 Abdominal X-ray:  Significant interval improvement in colonic distension, which has mostly resolved 10/1 Abdominal X-ray: Persistent gaseous dilation of the colon, though mildly improved compared to 06/02/2022 10/2 Abdominal X-ray: Worsened gaseous distention of the colon 10/3 Interval placement of colonic decompression tube with decreased colonic gaseous distention. 10/9 Gaseous distention of transverse and proximal descending colon with interval worsening. 10/12: abdominal x-ray: Slight improvement in moderate distention of the ascending and transverse colon. No evidence of bowel obstruction.  GI Surgeries / Procedures:  9/29: Decompressive colonoscopy 10/3: Decompressive colonoscopy  Central access: DL PICC placed 10/4 TPN start date: 10/4  Nutritional Goals: Goal TPN rate is 60 mL/hr (provides101 g of protein and 1938 kcals per day)  RD Assessment: Estimated Needs Total Energy Estimated Needs: 1900-2200kcal/day Total Protein Estimated Needs: 95-110g/day Total Fluid Estimated Needs: 1.9-2.2L/day  Current Nutrition:  NPO  Plan:  --Per discussion with RD, attempting to concentrate TPN d/t fluid restriction (hx of HF) -- Scr increase 0.90>>1.47>>1.47- watch --Continue TPN at 60 mL/hr (goal rate) Goal rate TPN will provide: Protein: 101 g (70 g/L of Clinisol 15%) Dextrose: 346 g (24%) Lipids: 36 g (36 g/L) Kcals / day: 1938 Fluids: 1.4 L --Electrolytes in TPN: Na 80mq/L, Ca 514m/L, Decreased K from 40 to 35 mEq/L on 10/14, Phos 12 mmol/L and Mg 5 mEq/L. Maximize acetate -10/15 MD ordered Magnesium 2 gm IV x1 --Add standard MVI and trace elements to TPN and thiamine 100 mg x 3 days (completed) --Continue sensitive q6h SSI and adjust as needed  --Monitor TPN labs on Mon/Thurs   Bryanna Yim A 06/18/2022,10:48 AM

## 2022-06-18 NOTE — Progress Notes (Signed)
Progress Note   Patient: Calvin [name] HAL:[id number] DOB: April 21, 1951 DOA: 05/28/2022     21 DOS: the patient was seen and examined on 06/18/2022   Brief hospital course:  Calvin [name] is a 71 y.o. male with medical history significant of Ogilvie's syndrome, ileus, hypertension, hyperlipidemia, COPD, stroke with mild left-sided weakness, GERD, gout, depression with anxiety, seizure, CAD, MI,  alcohol abuse, CKD stage IIIa, dCHF, colon cancer, thyroid cancer, who presents from his facility with Nausea, vomiting, abdominal distention, abdominal pain for 2 days. Found to have new small bowel obstruction on CT abdomen with transition point in the right lower abdomen suspicious for adhesion. General surgery was consulted and NG tube was placed.  Patient also developed left-sided tingling and numbness and facial droop while in ED. CT head and MRI brain was negative for any acute intracranial abnormality and do reflect a chronic changes. 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  MRI of the cervical spine showed C3-C4 moderate to severe spinal stenosis, seen by neurosurgery, recommended outpatient follow-up.  9/25: Continued to have significant colonic distention.  NG suction continued. The patient was transferred to the stepdown unit for neostigmine dosing on 05/31/2022 and 06/01/2022.   Patient had a colonoscopy decompression on 9/29 and 10/3, followed with rectal tube. Tube feeding started on 10/30-10/3.  10/4.  Modified barium swallow showed a significant risk of aspiration.  TPN started after PICC line placed.  10/10.  Patient currently on TPN, GI cannot place PEG tube due to worsening due to abdominal distention.  Patient will need a total colectomy in order to survive.  Dr. Roosevelt Locks spoke with Dr. Doyne Keel from [facility] and he did not believe that he was a good surgical candidate and can touch back with them in a week if not improved.  10/12.  Patient considering major surgical  intervention requiring colectomy and ileostomy and feeding tube.    10/13. Dr Dahlia Byes took to the OR for robotic assisted laparoscopic transverse loop colostomy creation.  Assessment and Plan: * Ogilvie syndrome General surgery does not think this is bowel obstruction.  Gastroenterology gave neostigmine infusion on 05/31/2022 and on 06/01/22.  Decompressive colonoscopy procedure on 06/02/2022 and on 06/06/2022 and left a rectal tube in. Patient did not do well with barium swallow exam.  Patient currently on TPN.  Dr. Dahlia Byes took to the operating room for robotic assisted transverse colon diverting colostomy on 06/16/2022.  Currently NPO.  Will have a swallow evaluation with speech pathology this week coming up.  May end up needing a PEG.  Acute kidney injury superimposed on CKD ([place]) AKI on CKD stage IIIa.  Creatinine 1.47 today.  Creatinine was 0.90 2 days ago.  We will give a fluid bolus.  Continue TPN.  Paroxysmal atrial flutter (HCC) Currently rate controlled.  On as needed IV metoprolol.  We will hold off on major anticoagulation today and may be restarted tomorrow.  Cervical spinal stenosis MRI showing multilevel cervical spondylosis most pronounced at C3-C4 where moderate to severe canal stenosis and severe bilateral foraminal stenosis.   Chronic diastolic CHF (congestive heart failure) (HCC) -Last EF 50% -Hold Lasix   Seizures (HCC) Seizure -Seizure precaution -When necessary Ativan for seizure -IV Keppra and Depakote.    Aspiration pneumonia ([place]) Completed 5 days of Zosyn on 06/05/2022.  HTN (hypertension) NG tube out and failed swallow eval.  As needed IV metoprolol.  Respiratory distress Respiratory distress on 06/01/2022 with upper airway congestion.  Improved with Decadron and racemic  epinephrine infusion.  Currently not on oxygen.  CAD (coronary artery disease) No chest pain.  Troponin negative x2   HLD (hyperlipidemia) Hold Zocor  Chronic kidney disease, stage 3a  ([place]) Renal function stable.  GFR> 60. -Follow-up by BMP  COPD (chronic obstructive pulmonary disease) (HCC) Continue nebulizers  Coffee ground emesis Coffee-ground emesis was reported, but hemoglobin stable. Hemoglobin 14.9. -IV Protonix 40 mg twice daily -Follow-up CBC every 8 hours -PT/OT and type screen  Tobacco abuse -nicotine patch  Depression with anxiety Hold home oral Xanax and Zoloft -As needed IV Ativan for anxiety  Headache IV magnesium  Dysphagia Did not do well with swallow evaluation.  Neuropathy Will need to be restarted on gabapentin once able to swallow        Subjective: Patient still having abdominal pain.  Feels a little bit better than yesterday.  Has a headache.  Was able to come off oxygen.  Physical Exam: Vitals:   06/18/22 0146 06/18/22 0400 06/18/22 0600 06/18/22 0918  BP: 111/61 110/70  104/62  Pulse: 79 78  75  Resp: '20 20  18  '$ Temp: 98.5 F (36.9 C) 97.8 F (36.6 C)  98 F (36.7 C)  TempSrc: Oral Oral  Oral  SpO2: 98% 95%  97%  Weight:   97.5 kg   Height:       Physical Exam HENT:     Head: Normocephalic.     Mouth/Throat:     Pharynx: No oropharyngeal exudate.  Eyes:     General: Lids are normal.     Conjunctiva/sclera: Conjunctivae normal.  Cardiovascular:     Rate and Rhythm: Normal rate and regular rhythm.     Heart sounds: Normal heart sounds, S1 normal and S2 normal.  Pulmonary:     Breath sounds: No decreased breath sounds, wheezing, rhonchi or rales.  Abdominal:     Palpations: Abdomen is soft.     Tenderness: There is generalized abdominal tenderness.  Musculoskeletal:     Right lower leg: Swelling present.     Left lower leg: Swelling present.  Skin:    General: Skin is warm.     Findings: No rash.  Neurological:     Mental Status: He is alert.     Data Reviewed: Creatinine 1.47, hemoglobin 10.3  Family Communication: Tried to reach the patient's daughter  Disposition: Status is:  Inpatient Remains inpatient appropriate because: Postoperative day 2 for robot-assisted laparoscopic transverse loop colostomy creation.  Planned Discharge Destination: Rehab    Time spent: 27 minutes  Author: Loletha Grayer, MD 06/18/2022 1:07 PM  For on call review [email or web address].

## 2022-06-18 NOTE — Progress Notes (Signed)
Subjective:  CC: Calvin [name] is a 71 y.o. male  Hospital stay day 21, 2 Days Post-Op robotic assisted laparoscopic transverse loop colostomy creation  HPI: No acute issues reported overnight.  Reported stool output in bag  ROS:  Unable to obtain secondary to patient baseline mental status.  Objective:   Temp:  [97.8 F (36.6 C)-98.5 F (36.9 C)] 97.8 F (36.6 C) (10/15 0400) Pulse Rate:  [74-80] 78 (10/15 0400) Resp:  [18-20] 20 (10/15 0400) BP: (105-114)/(59-70) 110/70 (10/15 0400) SpO2:  [95 %-100 %] 95 % (10/15 0400) Weight:  [97.5 kg] 97.5 kg (10/15 0600)     Height: '5\' 11"'$  (180.3 cm) Weight: 97.5 kg BMI (Calculated): 30   Intake/Output this shift:   Intake/Output Summary (Last 24 hours) at 06/18/2022 0908 Last data filed at 06/18/2022 0515 Gross per 24 hour  Intake --  Output 900 ml  Net -900 ml    Constitutional :  alert, cooperative, appears stated age, and no distress  Respiratory:  clear to auscultation bilaterally  Cardiovascular:  regular rate and rhythm  Gastrointestinal: Soft, no guarding, focal tenderness to palpation around ostomy site.  Nondistended. .  Ostomy mucosa pink, swollen as expected.  Scant mucus discharge noted in bag at time of exam  Skin: Cool and moist.   Psychiatric: Normal affect, non-agitated, not confused       LABS:     Latest Ref Rng & Units 06/18/2022    5:03 AM 06/17/2022   10:20 AM 06/16/2022    5:50 AM  CMP  Glucose 70 - 99 mg/dL 123  122  115   BUN 8 - 23 mg/dL 43  38  24   Creatinine 0.61 - 1.24 mg/dL 1.47  1.47  0.90   Sodium 135 - 145 mmol/L 142  139  143   Potassium 3.5 - 5.1 mmol/L 4.2  4.6  4.2   Chloride 98 - 111 mmol/L 107  106  110   CO2 22 - 32 mmol/L '27  27  26   '$ Calcium 8.9 - 10.3 mg/dL 8.4  8.3  8.9   Total Protein 6.5 - 8.1 g/dL  6.1    Total Bilirubin 0.3 - 1.2 mg/dL  0.5    Alkaline Phos 38 - 126 U/L  63    AST 15 - 41 U/L  23    ALT 0 - 44 U/L  39        Latest Ref Rng & Units 06/18/2022     5:03 AM 06/17/2022   10:20 AM 06/15/2022    1:56 AM  CBC  WBC 4.0 - 10.5 K/uL 7.6  11.2  6.3   Hemoglobin 13.0 - 17.0 g/dL 10.3  11.1  11.9   Hematocrit 39.0 - 52.0 % 32.8  35.8  38.7   Platelets 150 - 400 K/uL 208  232  268     RADS: N/a Assessment:   71 y.o. male with chronic colonic distension/Ogilvie's s/p second decompressive colonoscopy on 12/45, complicated by significant comorbid disease, physical deconditioning, overall poor functional status.  Status post robotic assisted laparoscopic transverse loop colostomy creation.  As expected at this point.  Continue supportive care.   labs/images/medications/previous chart entries reviewed personally and relevant changes/updates noted above.

## 2022-06-19 ENCOUNTER — Inpatient Hospital Stay: Payer: Medicare Other

## 2022-06-19 DIAGNOSIS — Z720 Tobacco use: Secondary | ICD-10-CM

## 2022-06-19 DIAGNOSIS — I5032 Chronic diastolic (congestive) heart failure: Secondary | ICD-10-CM | POA: Diagnosis not present

## 2022-06-19 DIAGNOSIS — I4892 Unspecified atrial flutter: Secondary | ICD-10-CM

## 2022-06-19 DIAGNOSIS — I10 Essential (primary) hypertension: Secondary | ICD-10-CM

## 2022-06-19 DIAGNOSIS — R569 Unspecified convulsions: Secondary | ICD-10-CM

## 2022-06-19 DIAGNOSIS — N179 Acute kidney failure, unspecified: Secondary | ICD-10-CM | POA: Diagnosis not present

## 2022-06-19 DIAGNOSIS — G629 Polyneuropathy, unspecified: Secondary | ICD-10-CM

## 2022-06-19 DIAGNOSIS — F418 Other specified anxiety disorders: Secondary | ICD-10-CM

## 2022-06-19 DIAGNOSIS — K5981 Ogilvie syndrome: Secondary | ICD-10-CM | POA: Diagnosis not present

## 2022-06-19 LAB — GLUCOSE, CAPILLARY
Glucose-Capillary: 125 mg/dL — ABNORMAL HIGH (ref 70–99)
Glucose-Capillary: 109 mg/dL — ABNORMAL HIGH (ref 70–99)
Glucose-Capillary: 108 mg/dL — ABNORMAL HIGH (ref 70–99)
Glucose-Capillary: 135 mg/dL — ABNORMAL HIGH (ref 70–99)

## 2022-06-19 LAB — COMPREHENSIVE METABOLIC PANEL
ALT: 22 U/L (ref 0–44)
AST: 17 U/L (ref 15–41)
Albumin: 2.7 g/dL — ABNORMAL LOW (ref 3.5–5.0)
Alkaline Phosphatase: 61 U/L (ref 38–126)
Anion gap: 5 (ref 5–15)
BUN: 41 mg/dL — ABNORMAL HIGH (ref 8–23)
CO2: 30 mmol/L (ref 22–32)
Calcium: 8.2 mg/dL — ABNORMAL LOW (ref 8.9–10.3)
Chloride: 106 mmol/L (ref 98–111)
Creatinine, Ser: 1.3 mg/dL — ABNORMAL HIGH (ref 0.61–1.24)
GFR, Estimated: 58 mL/min — ABNORMAL LOW (ref >60)
Glucose, Bld: 115 mg/dL — ABNORMAL HIGH (ref 70–99)
Potassium: 4.5 mmol/L (ref 3.5–5.1)
Sodium: 141 mmol/L (ref 135–145)
Total Bilirubin: 0.6 mg/dL (ref 0.3–1.2)
Total Protein: 6 g/dL — ABNORMAL LOW (ref 6.5–8.1)

## 2022-06-19 LAB — PHOSPHORUS: Phosphorus: 4.6 mg/dL (ref 2.5–4.6)

## 2022-06-19 LAB — MAGNESIUM: Magnesium: 2.4 mg/dL (ref 1.7–2.4)

## 2022-06-19 LAB — TRIGLYCERIDES: Triglycerides: 85 mg/dL (ref <150)

## 2022-06-19 MED ORDER — ENOXAPARIN SODIUM 60 MG/0.6ML IJ SOSY
0.5000 mg/kg | PREFILLED_SYRINGE | INTRAMUSCULAR | Status: DC
  Administered 2022-06-19 – 2022-06-30 (×12): 50 mg via SUBCUTANEOUS
  Filled 2022-06-19 (×12): qty 0.6

## 2022-06-19 MED ORDER — GABAPENTIN 600 MG PO TABS
300.0000 mg | ORAL_TABLET | Freq: Three times a day (TID) | ORAL | Status: DC
  Administered 2022-06-19 – 2022-06-21 (×6): 300 mg via ORAL
  Filled 2022-06-19 (×6): qty 1

## 2022-06-19 MED ORDER — NICOTINE 14 MG/24HR TD PT24
14.0000 mg | MEDICATED_PATCH | Freq: Every day | TRANSDERMAL | Status: DC
  Administered 2022-06-19 – 2022-06-30 (×5): 14 mg via TRANSDERMAL
  Filled 2022-06-19 (×11): qty 1

## 2022-06-19 MED ORDER — NEPRO/CARBSTEADY PO LIQD
237.0000 mL | Freq: Three times a day (TID) | ORAL | Status: DC
  Administered 2022-06-19 – 2022-06-30 (×27): 237 mL via ORAL

## 2022-06-19 MED ORDER — TRACE MINERALS CU-MN-SE-ZN 300-55-60-3000 MCG/ML IV SOLN
INTRAVENOUS | Status: AC
  Filled 2022-06-19: qty 672

## 2022-06-19 NOTE — Evaluation (Signed)
Physical Therapy Evaluation Patient Details Name: Calvin [name] MRN: [id number] DOB: 05-Aug-1951 Today's Date: 06/19/2022  History of Present Illness  Patient is a 71 year old male with medical history significant of Ogilvie's syndrome, ileus, hypertension, hyperlipidemia, COPD, stroke with mild left-sided weakness, GERD, gout, depression with anxiety, seizure, CAD, MI,  alcohol abuse, CKD stage IIIa, dCHF, colon cancer, thyroid cancer, who presents from his facility with Nausea, vomiting, abdominal distention, abdominal pain for 2 days. Found to have new small bowel obstruction. He developed L side tingling and numbness and facial droop while in the ED, head CT and MRI of brain was negative for acute abnormalitites. s/p laparoscopic transverse loop colostomy creation 10/13   Clinical Impression  Re-evaluation completed following laparoscopic transverse loop colostomy creation with goals updated accordingly. Patient continues to require physical assistance with mobility efforts. 2 standing bouts performed and patient able to take several side steps with assistance. Activity tolerance limited by fatigue and generalized weakness. Recommend to continue PT to maximize independence and facilitate return to prior level of function.     Recommendations for follow up therapy are one component of a multi-disciplinary discharge planning process, led by the attending physician.  Recommendations may be updated based on patient status, additional functional criteria and insurance authorization.  Follow Up Recommendations Skilled nursing-short term rehab (<3 hours/day) Can patient physically be transported by private vehicle: No    Assistance Recommended at Discharge Frequent or constant Supervision/Assistance  Patient can return home with the following  A lot of help with walking and/or transfers;A lot of help with bathing/dressing/bathroom;Assistance with cooking/housework;Help with stairs or ramp for  entrance;Assist for transportation;Direct supervision/assist for medications management;Direct supervision/assist for financial management    Equipment Recommendations None recommended by PT  Recommendations for Other Services       Functional Status Assessment Patient has had a recent decline in their functional status and demonstrates the ability to make significant improvements in function in a reasonable and predictable amount of time.     Precautions / Restrictions Precautions Precautions: Fall Restrictions Weight Bearing Restrictions: No      Mobility  Bed Mobility Overal bed mobility: Needs Assistance Bed Mobility: Supine to Sit, Sit to Supine Rolling: Mod assist   Supine to sit: Mod assist, +2 for physical assistance Sit to supine: Mod assist, +2 for physical assistance   General bed mobility comments: verbal cues for technique. increased time and effort required    Transfers Overall transfer level: Needs assistance Equipment used: Rolling walker (2 wheels) Transfers: Sit to/from Stand Sit to Stand: Mod assist, +2 physical assistance           General transfer comment: 2 bouts of standing performed. verbal cues for hand placement.    Ambulation/Gait Ambulation/Gait assistance: Mod assist Gait Distance (Feet): 3 Feet Assistive device: Rolling walker (2 wheels)         General Gait Details: side steps to the right. steadying assistance provided as well as assistance for negotiation of rolling walker  Stairs            Wheelchair Mobility    Modified Rankin (Stroke Patients Only)       Balance Overall balance assessment: Needs assistance Sitting-balance support: Feet supported Sitting balance-Leahy Scale: Fair     Standing balance support: Bilateral upper extremity supported Standing balance-Leahy Scale: Poor Standing balance comment: external support required  Pertinent Vitals/Pain Pain  Assessment Pain Assessment: 0-10 Pain Score: 9  Pain Location: around surgical site Pain Descriptors / Indicators: Sore, Aching Pain Intervention(s): Limited activity within patient's tolerance, Monitored during session, Repositioned    Home Living Family/patient expects to be discharged to::  (LTC)                        Prior Function Prior Level of Function : Needs assist                     Hand Dominance        Extremity/Trunk Assessment   Upper Extremity Assessment Upper Extremity Assessment: LUE deficits/detail LUE Deficits / Details: history of LUE weakness per patient report. difficulty maintaining grip on rolling walker for prolonged period of time LUE Sensation: decreased light touch    Lower Extremity Assessment Lower Extremity Assessment: Generalized weakness       Communication   Communication: HOH  Cognition Arousal/Alertness: Awake/alert Behavior During Therapy: Flat affect Overall Cognitive Status: No family/caregiver present to determine baseline cognitive functioning                                 General Comments: patient able to follow single step commands with increased time. increased time and effort required        General Comments General comments (skin integrity, edema, etc.): goals updated accordingly    Exercises     Assessment/Plan    PT Assessment Patient needs continued PT services  PT Problem List Decreased strength;Decreased range of motion;Decreased activity tolerance;Decreased balance;Decreased mobility;Decreased cognition;Decreased knowledge of use of DME;Pain       PT Treatment Interventions DME instruction;Therapeutic exercise;Gait training;Balance training;Neuromuscular re-education;Functional mobility training;Cognitive remediation;Therapeutic activities;Patient/family education    PT Goals (Current goals can be found in the Care Plan section)  Acute Rehab PT Goals Patient Stated Goal:  to get better PT Goal Formulation: With patient Time For Goal Achievement: 07/03/22 Potential to Achieve Goals: Fair    Frequency Min 2X/week     Co-evaluation PT/OT/SLP Co-Evaluation/Treatment: Yes Reason for Co-Treatment: Complexity of the patient's impairments (multi-system involvement);To address functional/ADL transfers PT goals addressed during session: Mobility/safety with mobility         AM-PAC PT "6 Clicks" Mobility  Outcome Measure Help needed turning from your back to your side while in a flat bed without using bedrails?: A Lot Help needed moving from lying on your back to sitting on the side of a flat bed without using bedrails?: A Lot Help needed moving to and from a bed to a chair (including a wheelchair)?: A Lot Help needed standing up from a chair using your arms (e.g., wheelchair or bedside chair)?: A Lot Help needed to walk in hospital room?: Total Help needed climbing 3-5 steps with a railing? : Total 6 Click Score: 10    End of Session   Activity Tolerance: Patient limited by fatigue Patient left: in bed;with call bell/phone within reach;with bed alarm set Nurse Communication:  (white board updated with mobility status) PT Visit Diagnosis: Muscle weakness (generalized) (M62.81);Difficulty in walking, not elsewhere classified (R26.2)    Time: 3419-6222 PT Time Calculation (min) (ACUTE ONLY): 19 min   Charges:   PT Evaluation $PT Re-evaluation: 1 Re-eval          [name], PT, MPT   [name] 06/19/2022, 1:22 PM

## 2022-06-19 NOTE — Progress Notes (Addendum)
Progress Note   Patient: Calvin [name] DOB: 12-02-1950 DOA: 05/28/2022     22 DOS: the patient was seen and examined on 06/19/2022   Brief hospital course:  Calvin [name] is a 71 y.o. male with medical history significant of Ogilvie's syndrome, ileus, hypertension, hyperlipidemia, COPD, stroke with mild left-sided weakness, GERD, gout, depression with anxiety, seizure, CAD, MI,  alcohol abuse, CKD stage IIIa, dCHF, colon cancer, thyroid cancer, who presents from his facility with Nausea, vomiting, abdominal distention, abdominal pain for 2 days. Found to have new small bowel obstruction on CT abdomen with transition point in the right lower abdomen suspicious for adhesion. General surgery was consulted and NG tube was placed.  Patient also developed left-sided tingling and numbness and facial droop while in ED. CT head and MRI brain was negative for any acute intracranial abnormality and do reflect a chronic changes. 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  MRI of the cervical spine showed C3-C4 moderate to severe spinal stenosis, seen by neurosurgery, recommended outpatient follow-up.  9/25: Continued to have significant colonic distention.  NG suction continued. The patient was transferred to the stepdown unit for neostigmine dosing on 05/31/2022 and 06/01/2022.   Patient had a colonoscopy decompression on 9/29 and 10/3, followed with rectal tube. Tube feeding started on 10/30-10/3.  10/4.  Modified barium swallow showed a significant risk of aspiration.  TPN started after PICC line placed.  10/10.  Patient currently on TPN, GI cannot place PEG tube due to worsening due to abdominal distention.  Patient will need a total colectomy in order to survive.  Dr. [place] Locks spoke with Dr. Doyne Keel from [facility] and he did not believe that he was a good surgical candidate and can touch back with them in a week if not improved.  10/12.  Patient considering major surgical  intervention requiring colectomy and ileostomy and feeding tube.    10/13. Dr Dahlia Byes took to the OR for robotic assisted laparoscopic transverse loop colostomy creation.  10/16.  Surgical team cleared for starting liquids.  Need to get speech therapy for a swallow evaluation.  Hopefully they can do a modified swallow evaluation this afternoon versus tomorrow morning.  Assessment and Plan: * Ogilvie syndrome General surgery does not think this is bowel obstruction.  Gastroenterology gave neostigmine infusion on 05/31/2022 and on 06/01/22.  Decompressive colonoscopy procedure on 06/02/2022 and on 06/06/2022 and left a rectal tube in. Patient did not do well with barium swallow exam.  Patient currently on TPN.  Dr. Dahlia Byes took to the operating room for robotic assisted transverse colon diverting colostomy on 06/16/2022.  Currently NPO.  Will have a swallow evaluation with speech pathology today versus tomorrow morning.  May end up needing a PEG.  Acute kidney injury superimposed on CKD ([place]) AKI on CKD stage IIIa.  Creatinine 1.3 today.  Creatinine was 0.90 2 days ago.  Continue TPN.  Paroxysmal atrial flutter (HCC) Currently rate controlled.  On as needed IV metoprolol.  We will hold off on major anticoagulation today until we determine if the patient needs a PEG or not.  DVT prophylaxis ordered.  Cervical spinal stenosis MRI showing multilevel cervical spondylosis most pronounced at C3-C4 where moderate to severe canal stenosis and severe bilateral foraminal stenosis.   Chronic diastolic CHF (congestive heart failure) (HCC) -Last EF 50% -Hold Lasix   Seizures (HCC) Seizure -Seizure precaution -When necessary Ativan for seizure -IV Keppra and Depakote.    Aspiration pneumonia ([place]) Completed 5 days  of Zosyn on 06/05/2022.  HTN (hypertension) NG tube out and failed previous swallow eval.  As needed IV metoprolol.  Respiratory distress Respiratory distress on 06/01/2022 with upper airway  congestion.  Improved with Decadron and racemic epinephrine infusion.  Currently not on oxygen.  CAD (coronary artery disease) No chest pain.  Troponin negative x2   HLD (hyperlipidemia) Hold Zocor  COPD (chronic obstructive pulmonary disease) (HCC) Continue nebulizers  Tobacco abuse -nicotine patch down to 14 mcg.  Depression with anxiety Hold home oral Xanax and Zoloft -As needed IV Ativan for anxiety  Dysphagia Did not do well with last swallow evaluation.  Patient to have another swallow evaluation today or tomorrow.  Neuropathy Will need to be restarted on gabapentin once able to swallow        Subjective: Patient feels a little bit better.  Still having some abdominal pain.  I told him that all his pain medications are as needed he just needs to ask the nurse.  As per general surgery ostomy does have output.  Admitted with Ogilvie's syndrome  Physical Exam: Vitals:   06/18/22 0918 06/18/22 1909 06/19/22 0522 06/19/22 0748  BP: 104/62 124/60 117/67 108/60  Pulse: 75 89 84 82  Resp: '18 20 20 19  '$ Temp: 98 F (36.7 C) 98.9 F (37.2 C) 97.7 F (36.5 C) 97.8 F (36.6 C)  TempSrc: Oral Oral Oral Oral  SpO2: 97% 97% 99% 98%  Weight:   98 kg   Height:       Physical Exam HENT:     Head: Normocephalic.     Mouth/Throat:     Pharynx: No oropharyngeal exudate.  Eyes:     General: Lids are normal.     Conjunctiva/sclera: Conjunctivae normal.  Cardiovascular:     Rate and Rhythm: Normal rate and regular rhythm.     Heart sounds: Normal heart sounds, S1 normal and S2 normal.  Pulmonary:     Breath sounds: No decreased breath sounds, wheezing, rhonchi or rales.  Abdominal:     Palpations: Abdomen is soft.     Tenderness: There is generalized abdominal tenderness.  Musculoskeletal:     Right lower leg: Swelling present.     Left lower leg: Swelling present.  Skin:    General: Skin is warm.     Findings: No rash.  Neurological:     Mental Status: He is  alert.     Data Reviewed: Creatinine 1.3 Abdominal x-ray showed nonobstructive bowel gas pattern.  Persistent retained oral contrast throughout the colon and rectum.  Moderate gaseous distention of the colon unchanged.  Family Communication: Updated patient's daughter on the phone  Disposition: Status is: Inpatient Remains inpatient appropriate because: Need to determine if the patient can swallow or if he needs a PEG.  Planned Discharge Destination: Rehab    Time spent: 27 minutes  Author: Loletha Grayer, MD 06/19/2022 1:06 PM  For on call review [email or web address].

## 2022-06-19 NOTE — Evaluation (Signed)
Objective Swallowing Evaluation: Type of Study: MBS-Modified Barium Swallow Study   Patient Details  Name: KRISHAN MCBREEN MRN: [id number] Date of Birth: February 15, 1951  Today's Date: 06/19/2022 Time: SLP Start Time (ACUTE ONLY): 1345 -SLP Stop Time (ACUTE ONLY): 1500  SLP Time Calculation (min) (ACUTE ONLY): 75 min   Past Medical History:  Past Medical History:  Diagnosis Date   Alcohol abuse    drinks on weekend   Anemia    Anxiety    Arthritis    Cancer ([place])    colon,throat   COPD (chronic obstructive pulmonary disease) ([place])    Coronary artery disease    Depression    Gout    Hypertension    Myocardial infarction ([place])    Neuromuscular disorder ([place])    Seizures ([place])    last 6 months ago   Stroke [facility])    multiple  left side weakness   Tremors of nervous system    Past Surgical History:  Past Surgical History:  Procedure Laterality Date   CARPAL TUNNEL RELEASE Left 10/19/2015   Procedure: CARPAL TUNNEL RELEASE;  Surgeon: Hessie Knows, MD;  Location: [facility] ORS;  Service: Orthopedics;  Laterality: Left;   COLON SURGERY     COLONOSCOPY WITH PROPOFOL N/A 10/26/2021   Procedure: COLONOSCOPY WITH PROPOFOL;  Surgeon: Lesly Rubenstein, MD;  Location: [facility] ENDOSCOPY;  Service: Endoscopy;  Laterality: N/A;   COLONOSCOPY WITH PROPOFOL N/A 06/02/2022   Procedure: COLONOSCOPY WITH PROPOFOL;  Surgeon: Lesly Rubenstein, MD;  Location: [facility] ENDOSCOPY;  Service: Endoscopy;  Laterality: N/A;   COLONOSCOPY WITH PROPOFOL N/A 06/06/2022   Procedure: COLONOSCOPY WITH PROPOFOL;  Surgeon: Lesly Rubenstein, MD;  Location: [facility] ENDOSCOPY;  Service: Endoscopy;  Laterality: N/A;   JOINT REPLACEMENT     left partial hip    THROAT SURGERY  2013   cancer   HPI: Pt is 69 YOM admitted for small bowel obstruction and abdominal distention. PMH includes: CVA w L sided weakness, dCHF, seizures, CAD, HLD, CKD3a, COPD, tobacco abuse, depression, anxiety, and ogilive's syndrome.  Surgery consulted:  "abdominal pain, distension, nausea, and emesis thought to be secondary to possible small bowel obstruction also with stable appearance of chronic colonic distension, complicated by significant comorbid disease.".   CT of Abd: Lower chest: Stable small solid pulmonary nodule of the right lower  lobe measuring 5 mm on series 4, image 1. New mild linear nodular  opacity of the left lower lobe.  CXR on 06/05/2022: Enlarged cardiopericardial silhouette with pulmonary vascular  congestion.   Surgery is following for decompressive colonoscopy now; another is planned for 06/06/2022 per MD.   Subjective: pt awake, less wet vocal quality noted. NGT removed; TPN ongoing. Pt verbal and followed directions adequately given Time.    Recommendations for follow up therapy are one component of a multi-disciplinary discharge planning process, led by the attending physician.  Recommendations may be updated based on patient status, additional functional criteria and insurance authorization.  Assessment / Plan / Recommendation     06/19/2022    3:00 PM  Clinical Impressions  SLP Visit Diagnosis Dysphagia, oropharyngeal phase (R13.12)  Impact on safety and function Moderate aspiration risk;Risk for inadequate nutrition/hydration   MBSS Results: 06/19/2022 Pt seen today for a repeat MBSS. Pt has been receiving TPN and had another surgical procedure addressing the chronic colonic distension(now has a colostomy).  Pt arrived for the MBSS; awake, verbal and followed basic instructions given cues -- but w/ some delay in response and Time  needed for follow through. Unsure of pt's Baseline Cognitive function.  When education purpose of this study as to assessing his swallow for eating foods and drinking liquids, pt responded w/ "I don't want to eat; I want to drink Ensure" -- he endorsed that drinking Ensure was the only thing he consumed when at home PRIOR to this admit. He could not identify foods that he ate at home PRIOR  to admit.   Pt appears to present w/ MOD-SEVERE oropharyngeal phase Dysphagia w/ Neuromuscular, and potential Cognitive, deficits resulting laryngeal penetration during and after trials(pharyngeal residue after) at this MBSS today. Pt appears at MOD risk for Aspiration (and for Pulmonary decline) w/ current swallowing presentation.  Pt also presents w/ risk for inability to meet his nutrition needs orally d/t the extent of the oral phase deficits/Time needed. Pt's MBSS results are slightly improved from a pharyngeal phase from his last MBSS on 06/07/2022. Recommendations and results were discussed w/ MD/NSG Team members post study.    During the Oral phase -- deficits c/b Lengthy oral phase time w/ boluses of increased texture(puree, softened solid). There is rocking of the bolus during oral transfer and delay in A-P transfer w/ oral phase time of ~30 seconds w/ the puree consistency; longer for the softened solid(~2 mins total time). Disorganized bolus manipulation and lingual control w/ all consistencies noted; w/ thin and Nectar liquid consistencies, premature spillage to the valleculae>pyriform sinuses(thins) was noted. Diffuse oral and BOT residue remained post initial swallow indicating general lingual weakness and decreased oral awareness. W/ a f/u, Dry swallow, this remaining residue was reduced but not fully cleared -- suspect seeping of this residue into the Pharynx b/t boluses.  During the Pharyngeal phase -- deficits c/b delayed pharyngeal swallow initiation which appeared to occur as the liquid boluses contacted the posterior surface of the epiglottis and spilling to the pyriform sinuses w/ thin liquids; primarily the valleculae w/ Nectar liquids and solids. Pharyngeal stage also noted for reduced hyolaryngeal excursion and anterior movement. Epiglottic deflection was reduced/partial -- this, along w/ the delayed pharyngeal swallow initiation w/ thin liquids, resulted in MOD laryngeal Penetration  During/After the swallow. W/ trials of Nectar liquids, min less delay in swallow initiation was noted this study, and pt was able to achieve adequate airway closure During the swallow w/ no obvious laryngeal penetration occurring during the study. Airway closure w/ slightly reduced in tight laryngeal closure w/ all consistencies indicating decreased strength in general -- this results in decreased airway protection overall. W/ the laryngeal Penetration, pt exhibited Delayed or Absent sensation to the Penetration in the laryngeal vestibule; suspect possible aspiration occurred 1x d/t the Penetration building Between trials secondary to congested coughing Between trials.  Pt continues to exhibit MOD pharyngeal residue along the BOT, pharyngeal wall, valleculae, lateral channel residue, and pyriform sinuses post-swallows which resulted in MOD laryngeal vestibule residue collecting b/t trials = laryngeal Penetration. Again, pt had delayed or absent sensation of penetration/aspiration. Pt able to reduce the pharyngeal residue with f/u, Dry swallows(CUED). Less laryngeal vestibule residue/penetration occurred during this study.   During the swallow, a prominent-appearing cricopharyngeus muscle was noted. It did not appear to impede bolus motility during the study; no residue remained in/around the UES. This was difficult to see d/t shoulders obscuring view.   Pt was able to utilize the strategy of a f/u, Dry swallow which was most effective at reducing residue, but could not clear it. Pt had to be CUED to use it and a throat  clear/cough to clear laryngeal Penetration during the study.    Pt presents w/ MOD risk for aspiration and Pulmonary decline from aspiration/aspiration pneumonia. The results and recommendations were discussed w/ MD post study. Based on his discussion w/ Family and Surgeon, an oral diet (Dysphagia level 1 w/ NECTAR liquids via Cup) was agreed upon w/ MD. Pt continues w/ TPN at this time per MD.    MD will monitor pt's status w/ initiation of an oral diet and the meeting of nutritional needs along w/ the Dietician to determine need for PEG placement; a more long term support option.    Recommend f/u w/ skilled ST services for Dysphagia tx at next venue of care as pt continues to improve medically but post the Acuity of the hospitalization and illness for skilled Dysphagia therapy; education w/ Family. Recommend frequent oral care w/ NSG staff for hygiene and stimulation of oropharyngeal swallowing. Aspiration precautions; Supervision. Will f/u w/ Family for education on the dysphagia diet.       06/19/2022    3:00 PM  Treatment Recommendations  Treatment Recommendations Therapy as outlined in treatment plan below        06/19/2022    3:00 PM  Prognosis  Prognosis for Safe Diet Advancement Guarded  Barriers to Reach Goals Cognitive deficits;Language deficits;Time post onset;Severity of deficits;Motivation  Barriers/Prognosis Comment deconditioned; extended illness; dysphagia; no established nutrition/hydration for long term currently       06/19/2022    3:00 PM  Diet Recommendations  SLP Diet Recommendations Dysphagia 1 (Puree) solids;Nectar thick liquid  Liquid Administration via Cup;No straw  Medication Administration Crushed with puree  Compensations Minimize environmental distractions;Slow rate;Small sips/bites;Lingual sweep for clearance of pocketing;Multiple dry swallows after each bite/sip;Follow solids with liquid  Postural Changes Remain semi-upright after after feeds/meals (Comment);Seated upright at 90 degrees         06/19/2022    3:00 PM  Other Recommendations  Recommended Consults --  Oral Care Recommendations Oral care QID;Staff/trained caregiver to provide oral care  Other Recommendations Order thickener from pharmacy;Prohibited food (jello, ice cream, thin soups);Remove water pitcher;Have oral suction available  Follow Up Recommendations Follow physician's  recommendations for discharge plan and follow up therapies  Assistance recommended at discharge Frequent or constant Supervision/Assistance  Functional Status Assessment Patient has had a recent decline in their functional status and/or demonstrates limited ability to make significant improvements in function in a reasonable and predictable amount of time       06/19/2022    3:00 PM  Frequency and Duration   Speech Therapy Frequency (ACUTE ONLY) min 1 x/week  Treatment Duration 1 week         06/19/2022    3:00 PM  Oral Phase  Oral Phase Impaired  Oral - Nectar [place] Lingual pumping;Lingual/palatal residue;Delayed oral transit;Decreased bolus cohesion;Premature spillage  Oral - Nectar Cup Lingual pumping;Lingual/palatal residue;Delayed oral transit;Decreased bolus cohesion;Premature spillage  Oral - Thin [place] Lingual pumping;Lingual/palatal residue;Delayed oral transit;Decreased bolus cohesion;Premature spillage  Oral - Thin Cup Lingual pumping;Lingual/palatal residue;Delayed oral transit;Decreased bolus cohesion;Premature spillage  Oral - Puree Lingual pumping;Holding of bolus;Lingual/palatal residue;Delayed oral transit;Decreased bolus cohesion  Oral - Mech Soft Lingual pumping;Holding of bolus;Lingual/palatal residue;Piecemeal swallowing;Delayed oral transit;Decreased bolus cohesion  Oral Phase - Comment significantly increased oral phase time w/ increased textured boluses       06/19/2022    3:00 PM  Pharyngeal Phase  Pharyngeal Phase Impaired  Pharyngeal- Nectar Cup Reduced pharyngeal peristalsis;Reduced epiglottic inversion;Delayed swallow initiation-vallecula;Reduced laryngeal elevation;Reduced anterior laryngeal mobility;Reduced  airway/laryngeal closure;Reduced tongue base retraction;Pharyngeal residue - posterior pharnyx;Pharyngeal residue - valleculae;Pharyngeal residue - pyriform;Lateral channel residue;Compensatory strategies attempted (with  notebox);Penetration/Apiration after swallow  Pharyngeal- Thin Cup Delayed swallow initiation-pyriform sinuses;Reduced pharyngeal peristalsis;Reduced epiglottic inversion;Reduced anterior laryngeal mobility;Reduced laryngeal elevation;Reduced airway/laryngeal closure;Reduced tongue base retraction;Penetration/Aspiration during swallow;Pharyngeal residue - valleculae;Pharyngeal residue - pyriform;Pharyngeal residue - posterior pharnyx;Lateral channel residue;Compensatory strategies attempted (with notebox)  Pharyngeal- Puree Delayed swallow initiation-vallecula;Reduced pharyngeal peristalsis;Reduced epiglottic inversion;Reduced anterior laryngeal mobility;Reduced laryngeal elevation;Reduced tongue base retraction;Pharyngeal residue - valleculae;Pharyngeal residue - pyriform;Pharyngeal residue - posterior pharnyx;Lateral channel residue;Compensatory strategies attempted (with notebox);Penetration/Apiration after swallow  Pharyngeal- Mechanical Soft Delayed swallow initiation-vallecula;Reduced pharyngeal peristalsis;Reduced epiglottic inversion;Reduced anterior laryngeal mobility;Reduced laryngeal elevation;Reduced tongue base retraction;Pharyngeal residue - valleculae;Pharyngeal residue - pyriform;Pharyngeal residue - posterior pharnyx;Lateral channel residue;Compensatory strategies attempted (with notebox);Penetration/Apiration after swallow  Pharyngeal Comment MOD amount of residue remained in the pharynx post initial swallow; a f/u, Dry swallow (CUED) aided in reducing. Laryngeal Penetration of resiude noted b/t trials -- trace-min        06/19/2022    3:00 PM  Cervical Esophageal Phase   Cervical Esophageal Phase Impaired - prominent CP muscle but boluses appeared to clear the UES adequately         Orinda Kenner, MS, CCC-SLP Speech Language Pathologist Rehab Services; [place] [phone] (ascom) Johnney Scarlata 06/19/2022, 3:30 PM

## 2022-06-19 NOTE — Progress Notes (Signed)
Occupational Therapy Re-Evaluation Patient Details Name: Calvin [name] MRN: [id number] DOB: March 01, 1951 Today's Date: 06/19/2022   History of present illness Patient is a 71 year old male with medical history significant of Ogilvie's syndrome, ileus, hypertension, hyperlipidemia, COPD, stroke with mild left-sided weakness, GERD, gout, depression with anxiety, seizure, CAD, MI,  alcohol abuse, CKD stage IIIa, dCHF, colon cancer, thyroid cancer, who presents from his facility with Nausea, vomiting, abdominal distention, abdominal pain for 2 days. Found to have new small bowel obstruction. He developed L side tingling and numbness and facial droop while in the ED, head CT and MRI of brain was negative for acute abnormalitites. s/p laparoscopic transverse loop colostomy creation 10/13   OT comments  Mr Haapala was seen for OT/PT re-evaluations on this date s/p above surgery. Upon arrival to room pt reclined in bed, agreeable to tx. Pt requires MAX A rolling bed level for bathing. MOD A x2 sup<>sit, fair static sitting balance with single UE support. Completed x2 Sit<>stand at EOB MOD A x2 + RW. Goals updated to reflect progress, will continue to follow POC. Discharge recommendation remains appropriate.     Recommendations for follow up therapy are one component of a multi-disciplinary discharge planning process, led by the attending physician.  Recommendations may be updated based on patient status, additional functional criteria and insurance authorization.    Follow Up Recommendations  Skilled nursing-short term rehab (<3 hours/day)    Assistance Recommended at Discharge Frequent or constant Supervision/Assistance  Patient can return home with the following  Two people to help with walking and/or transfers;A lot of help with bathing/dressing/bathroom;Assistance with cooking/housework;[name];Assist for transportation;Direct supervision/assist for medications management    Equipment Recommendations  Other (comment) (defer)    Recommendations for Other Services      Precautions / Restrictions Precautions Precautions: Fall Restrictions Weight Bearing Restrictions: No       Mobility Bed Mobility Overal bed mobility: Needs Assistance Bed Mobility: Rolling, Supine to Sit, Sit to Supine Rolling: Max assist   Supine to sit: Mod assist, +2 for physical assistance Sit to supine: Mod assist, +2 for physical assistance        Transfers Overall transfer level: Needs assistance Equipment used: Rolling walker (2 wheels) Transfers: Sit to/from Stand Sit to Stand: Mod assist, +2 physical assistance                 Balance Overall balance assessment: Needs assistance Sitting-balance support: Feet supported Sitting balance-Leahy Scale: Fair     Standing balance support: Bilateral upper extremity supported Standing balance-Leahy Scale: Fair                             ADL either performed or assessed with clinical judgement   ADL Overall ADL's : Needs assistance/impaired                                       General ADL Comments: MAX A don B socks seated EOB. MOD A x2 + RW for simulated BSC t/f    Extremity/Trunk Assessment Upper Extremity Assessment Upper Extremity Assessment: LUE deficits/detail LUE Deficits / Details: pt reports hx stroke related weakness, limited grip LUE Sensation: decreased light touch   Lower Extremity Assessment Lower Extremity Assessment: Generalized weakness        Cognition Arousal/Alertness: Awake/alert Behavior During Therapy: Flat affect  Overall Cognitive Status: No family/caregiver present to determine baseline cognitive functioning                                 General Comments: follows commands with repetition and time              General Comments goals updated accordingly    Pertinent Vitals/ Pain       Pain Assessment Pain Assessment:  0-10 Pain Score: 9  Pain Location: around surgical site Pain Descriptors / Indicators: Sore, Aching Pain Intervention(s): Limited activity within patient's tolerance, Repositioned   Frequency  Min 2X/week        Progress Toward Goals  OT Goals(current goals can now be found in the care plan section)  Progress towards OT goals: Progressing toward goals  Acute Rehab OT Goals Patient Stated Goal: to get better OT Goal Formulation: With patient Time For Goal Achievement: 07/03/22 Potential to Achieve Goals: Fair ADL Goals Pt Will Perform Grooming: standing;with min guard assist Pt Will Perform Lower Body Dressing: with min assist;sitting/lateral leans Pt Will Transfer to Toilet: with min assist;stand pivot transfer;bedside commode Pt Will Perform Toileting - Clothing Manipulation and hygiene: with min guard assist;sitting/lateral leans  Plan Discharge plan remains appropriate;Frequency remains appropriate    Co-evaluation    PT/OT/SLP Co-Evaluation/Treatment: Yes Reason for Co-Treatment: Complexity of the patient's impairments (multi-system involvement);For patient/therapist safety PT goals addressed during session: Mobility/safety with mobility OT goals addressed during session: ADL's and self-care      AM-PAC OT "6 Clicks" Daily Activity     Outcome Measure   Help from another person eating meals?: Total (NPO) Help from another person taking care of personal grooming?: A Little Help from another person toileting, which includes using toliet, bedpan, or urinal?: A Lot Help from another person bathing (including washing, rinsing, drying)?: A Lot Help from another person to put on and taking off regular upper body clothing?: A Little Help from another person to put on and taking off regular lower body clothing?: A Lot 6 Click Score: 13    End of Session Equipment Utilized During Treatment: Rolling walker (2 wheels)  OT Visit Diagnosis: Muscle weakness (generalized)  (M62.81);Unsteadiness on feet (R26.81)   Activity Tolerance Patient tolerated treatment well   Patient Left in bed;with call bell/phone within reach;with bed alarm set   Nurse Communication          Time: 0902-0920 OT Time Calculation (min): 18 min  Charges: OT General Charges $OT Visit: 1 Visit OT Evaluation $OT Re-eval: 1 Re-eval OT Treatments $Self Care/Home Management : 8-22 mins  Dessie Coma, M.S. OTR/L  06/19/22, 1:38 PM  ascom [phone]

## 2022-06-19 NOTE — TOC Progression Note (Signed)
Transition of Care [facility]) - Progression Note    Patient Details  Name: Calvin [name] MRN: [id number] Date of Birth: 05-23-1951  [facility] CM/SW Contact  Beverly Sessions, RN Phone Number: 06/19/2022, 2:42 PM  Clinical Narrative:    TOC still following for disposition  [facility] at [facility]    Expected Discharge Plan: [place] Barriers to Discharge: Continued Medical Work up  Expected Discharge Plan and Services Expected Discharge Plan: [place]   Discharge Planning Services: CM Consult Post Acute Care Choice: Resumption of Svcs/PTA Provider, [place] Living arrangements for the past 2 months: [place]                 DME Arranged: N/A DME Agency: NA       HH Arranged: NA HH Agency: NA         Social Determinants of Health (SDOH) Interventions    Readmission Risk Interventions    06/02/2022   12:02 PM 10/17/2021   10:47 AM  Readmission Risk Prevention Plan  Transportation Screening Complete Complete  PCP or Specialist Appt within 3-5 Days  Complete  Social Work Consult for [place] Planning/Counseling  Complete  Palliative Care Screening  Not Applicable  Medication Review Press photographer) Complete Complete  PCP or Specialist appointment within 3-5 days of discharge Complete   HRI or Mutual Complete   SW Recovery Care/Counseling Consult Complete   Palliative Care Screening Complete   Skilled Nursing Facility Complete

## 2022-06-19 NOTE — Consult Note (Signed)
PHARMACY - TOTAL PARENTERAL NUTRITION CONSULT NOTE   Indication:  Intolerance to enteral feeding  Patient Measurements: Height: '5\' 11"'$  (180.3 cm) Weight: 98 kg (216 lb 0.8 oz) IBW/kg (Calculated) : 75.3 TPN AdjBW (KG): 92.6 Body mass index is 30.13 kg/m.  Assessment:  Patient is a 71 y/o M with medical history including Ogilvie's syndrome, HTN, HLD, COPD, CVA, GERD, gout, depression / anxiety, seizures, CAD, EtOH use disorder, CKD, diastolic CHF, colon cancer, thyroid cancer who is admitted with resolved SBO but now with worsening pseudoobstruction. GI consulted. Patient failed to respond to conservative measures with methylnaltrexone and neostigmine x 2. Patient underwent decompressive colonoscopy on 0/62 complicated by colonic distension post-procedurally. Patient underwent repeat decompressive colonoscopy on 10/3. Patient had been on tube feeds at trickle rate. Pharmacy consulted to initiate TPN to help meet nutritional goals given acute abdominal issues and intolerance to enteral nutrition.  10/10: -Elevated K and Phos. -GI unable to place PEG tube -Patient received IV contrast 10/9.  10/11: -K and Phos have normalized  10/12: -Scr back to  baseline (Scr = 1.03) -Cl elevated -Na trending up  10/13: -Labs and renal function have normalized -Robotic assisted laparoscopic loop transverse colostomy  10/16: -Phosphorus and magnesium on upper end of normal -Scr elevated from baseline (Scr = 1.3)   Glucose / Insulin: Pre-diabetic range Hgb A1c (5.8% on 05/28/22). Last 24 hrs: 2 units insulin SSI   (received dexamethasone 4 mg IV x 1 10/13. Electrolytes: WNL Renal: Scr 1.47>>1.47>>1.3 Hepatic: WNL Intake / Output; MIVF: +6.9L for the admission. No MIVF running GI Imaging: 9/24 CT abdomen / pelvis: SBO and chronic colonic ileus or pseudo-obstruction 9/26 Abdominal X-ray: Air-filled distention of large and small bowel loops favoring ileus 9/27 CT abdomen / pelvis: No evidence of  SBO 9/29 Abdominal X-ray: Severe dilatation of the colon as can be seen with pseudo-obstruction/colonic ileus 9/30 Abdominal X-ray: Significant interval improvement in colonic distension, which has mostly resolved 10/1 Abdominal X-ray: Persistent gaseous dilation of the colon, though mildly improved compared to 06/02/2022 10/2 Abdominal X-ray: Worsened gaseous distention of the colon 10/3 Interval placement of colonic decompression tube with decreased colonic gaseous distention. 10/9 Gaseous distention of transverse and proximal descending colon with interval worsening. 10/12: abdominal x-ray: Slight improvement in moderate distention of the ascending and transverse colon. No evidence of bowel obstruction.  GI Surgeries / Procedures:  9/29: Decompressive colonoscopy 10/3: Decompressive colonoscopy 10/13: Robotic assisted laparoscopic loop transverse colostomy Central access: DL PICC placed 10/4 TPN start date: 10/4  Nutritional Goals: Goal TPN rate is 60 mL/hr (provides101 g of protein and 1938 kcals per day)  RD Assessment: Estimated Needs Total Energy Estimated Needs: 1900-2200kcal/day Total Protein Estimated Needs: 95-110g/day Total Fluid Estimated Needs: 1.9-2.2L/day  Current Nutrition:  NPO  Plan:  --Per discussion with RD, attempting to concentrate TPN d/t fluid restriction (hx of HF) --10/16: Scr increase 0.90>>1.47>>1.3- watch --Continue TPN at 60 mL/hr (goal rate) Goal rate TPN will provide: Protein: 101 g (70 g/L of Clinisol 15%) Dextrose: 346 g (24%) Lipids: 36 g (36 g/L) Kcals / day: 1938 Fluids: 1.4 L --Electrolytes in TPN: Na 28mq/L, Ca 521m/L, K 30 mEq/L, Phos 9 mmol/L and Mg 3 mEq/L. Cl:Ac 1:2  --Add standard MVI and trace elements to TPN and thiamine 100 mg x 3 days (completed) --Continue Moderate q6h SSI and adjust as needed  --Monitor TPN labs on Mon/Thurs --Decrease Potassium, Phosphorus, and magnesium --recheck labs 10/17   HoLorin Picket0/16/2023,7:32 AM

## 2022-06-19 NOTE — Progress Notes (Signed)
Mobility Specialist - Progress Note   06/19/22 1000  Mobility  Activity Refused mobility     Pt declined mobility, reporting that he had just gotten up and took several steps with staff prior to entry. Will attempt another date/time.    Kathee Delton Mobility Specialist 06/19/22, 10:13 AM

## 2022-06-19 NOTE — Consult Note (Addendum)
[facility] Nurse ostomy consult note Stoma type/location: LUQ, loop ostomy Stomal assessment/size: 1 1/2" round, budded  Peristomal assessment: intact  Treatment options for stomal/peristomal skin: 2" skin barrier ring Output none Ostomy pouching: 2pc 2 3/4:" with 2" ostomy barrier ring 1 extra pouch/barrier/ ring left in the room Education provided: patient adamant he will not be performing care; SNF staff will care for patient  Enrolled patient in [place] program: No   Re consult if needed, will not follow at this time. Thanks  Iva Montelongo [employer], RN,CWOCN, CNS, [place] [phone])

## 2022-06-19 NOTE — Progress Notes (Signed)
Order received from MD to decrease nicotine patch from '21mg'$  to 14 mg

## 2022-06-19 NOTE — Progress Notes (Signed)
[facility] Day(s): 22.   Post op day(s): 3 Days Post-Op.   Interval History:  Patient seen and examined No acute events or new complaints overnight.  Patient resting comfortably; unreliable historian AKI improving; sCr - 1.30; UO - 900 ccs No electrolyte derangements He remains NPO Having colostomy output   Vital signs in last 24 hours: [min-max] current  Temp:  [97.7 F (36.5 C)-98.9 F (37.2 C)] 97.7 F (36.5 C) (10/16 0522) Pulse Rate:  [75-89] 84 (10/16 0522) Resp:  [18-20] 20 (10/16 0522) BP: (104-124)/(60-67) 117/67 (10/16 0522) SpO2:  [97 %-99 %] 99 % (10/16 0522) Weight:  [98 kg] 98 kg (10/16 0522)     Height: '5\' 11"'$  (180.3 cm) Weight: 98 kg BMI (Calculated): 30.15   Intake/Output last 2 shifts:  10/15 0701 - 10/16 0700 In: 2566.6 [I.V.:1242.9; IV Piggyback:1323.7] Out: 900 [Urine:900]   Physical Exam:  Constitutional: alert, cooperative and no distress  Respiratory: breathing non-labored at rest  Cardiovascular: regular rate and sinus rhythm  Gastrointestinal: Soft, distension improved, he does not appear overtly tender, loop colostomy in LUQ is patent with gas and small amount of stool in bag.  Integumentary: Laparoscopic incisions are CDI with dermabond, no erythema or drainage   Labs:     Latest Ref Rng & Units 06/18/2022    5:03 AM 06/17/2022   10:20 AM 06/15/2022    1:56 AM  CBC  WBC 4.0 - 10.5 K/uL 7.6  11.2  6.3   Hemoglobin 13.0 - 17.0 g/dL 10.3  11.1  11.9   Hematocrit 39.0 - 52.0 % 32.8  35.8  38.7   Platelets 150 - 400 K/uL 208  232  268       Latest Ref Rng & Units 06/19/2022    5:40 AM 06/18/2022    5:03 AM 06/17/2022   10:20 AM  CMP  Glucose 70 - 99 mg/dL 115  123  122   BUN 8 - 23 mg/dL 41  43  38   Creatinine 0.61 - 1.24 mg/dL 1.30  1.47  1.47   Sodium 135 - 145 mmol/L 141  142  139   Potassium 3.5 - 5.1 mmol/L 4.5  4.2  4.6   Chloride 98 - 111 mmol/L 106  107  106   CO2 22 - 32  mmol/L '30  27  27   '$ Calcium 8.9 - 10.3 mg/dL 8.2  8.4  8.3   Total Protein 6.5 - 8.1 g/dL 6.0   6.1   Total Bilirubin 0.3 - 1.2 mg/dL 0.6   0.5   Alkaline Phos 38 - 126 U/L 61   63   AST 15 - 41 U/L 17   23   ALT 0 - 44 U/L 22   39      Imaging studies:   KUB (06/19/2022) personally reviewed which shows  continued improvement in colonic distension, and radiologist report pending   Assessment/Plan:  71 y.o. male with ROBF 3 Days Post-Op s/p robotic assisted laparoscopic transverse loop colostomy creation for recurrent/recalcitrant Ogilvie's Syndrome   - Continue to reassess for tolerance of PO intake - Continue PICC + TPN for now - Monitor colostomy output; WOC on board - Monitor abdominal examination   - Pain control prn; antiemetics prn   - Continue to work with therapies as feasible   - Further management per primary service; we will remain available    All of the above findings and recommendations were discussed with the  medical team.   -- Edison Simon, PA-C [facility] 06/19/2022, 7:01 AM M-F: 7am - 4pm

## 2022-06-20 DIAGNOSIS — N189 Chronic kidney disease, unspecified: Secondary | ICD-10-CM

## 2022-06-20 DIAGNOSIS — R1310 Dysphagia, unspecified: Secondary | ICD-10-CM

## 2022-06-20 DIAGNOSIS — R109 Unspecified abdominal pain: Secondary | ICD-10-CM

## 2022-06-20 DIAGNOSIS — N179 Acute kidney failure, unspecified: Secondary | ICD-10-CM

## 2022-06-20 DIAGNOSIS — I5032 Chronic diastolic (congestive) heart failure: Secondary | ICD-10-CM

## 2022-06-20 DIAGNOSIS — E785 Hyperlipidemia, unspecified: Secondary | ICD-10-CM

## 2022-06-20 DIAGNOSIS — K56609 Unspecified intestinal obstruction, unspecified as to partial versus complete obstruction: Secondary | ICD-10-CM | POA: Diagnosis not present

## 2022-06-20 DIAGNOSIS — J690 Pneumonitis due to inhalation of food and vomit: Secondary | ICD-10-CM

## 2022-06-20 DIAGNOSIS — M4802 Spinal stenosis, cervical region: Secondary | ICD-10-CM | POA: Diagnosis not present

## 2022-06-20 DIAGNOSIS — K5981 Ogilvie syndrome: Secondary | ICD-10-CM | POA: Diagnosis not present

## 2022-06-20 DIAGNOSIS — J449 Chronic obstructive pulmonary disease, unspecified: Secondary | ICD-10-CM

## 2022-06-20 DIAGNOSIS — I4892 Unspecified atrial flutter: Secondary | ICD-10-CM | POA: Diagnosis not present

## 2022-06-20 LAB — BASIC METABOLIC PANEL
Anion gap: 4 — ABNORMAL LOW (ref 5–15)
BUN: 47 mg/dL — ABNORMAL HIGH (ref 8–23)
CO2: 29 mmol/L (ref 22–32)
Calcium: 8.3 mg/dL — ABNORMAL LOW (ref 8.9–10.3)
Chloride: 108 mmol/L (ref 98–111)
Creatinine, Ser: 1.48 mg/dL — ABNORMAL HIGH (ref 0.61–1.24)
GFR, Estimated: 50 mL/min — ABNORMAL LOW (ref >60)
Glucose, Bld: 128 mg/dL — ABNORMAL HIGH (ref 70–99)
Potassium: 4.4 mmol/L (ref 3.5–5.1)
Sodium: 141 mmol/L (ref 135–145)

## 2022-06-20 LAB — GLUCOSE, CAPILLARY
Glucose-Capillary: 116 mg/dL — ABNORMAL HIGH (ref 70–99)
Glucose-Capillary: 120 mg/dL — ABNORMAL HIGH (ref 70–99)
Glucose-Capillary: 130 mg/dL — ABNORMAL HIGH (ref 70–99)

## 2022-06-20 LAB — MAGNESIUM: Magnesium: 2.4 mg/dL (ref 1.7–2.4)

## 2022-06-20 LAB — PHOSPHORUS: Phosphorus: 4.4 mg/dL (ref 2.5–4.6)

## 2022-06-20 MED ORDER — TRACE MINERALS CU-MN-SE-ZN 300-55-60-3000 MCG/ML IV SOLN
INTRAVENOUS | Status: AC
  Filled 2022-06-20: qty 672

## 2022-06-20 MED ORDER — OXYCODONE HCL 5 MG PO TABS
5.0000 mg | ORAL_TABLET | Freq: Four times a day (QID) | ORAL | Status: DC | PRN
  Administered 2022-06-21 – 2022-06-30 (×20): 5 mg via ORAL
  Filled 2022-06-20 (×20): qty 1

## 2022-06-20 MED ORDER — SODIUM CHLORIDE 0.9 % IV BOLUS
250.0000 mL | Freq: Once | INTRAVENOUS | Status: AC
  Administered 2022-06-20 (×2): 250 mL via INTRAVENOUS

## 2022-06-20 MED ORDER — LEVETIRACETAM 100 MG/ML PO SOLN
1000.0000 mg | Freq: Two times a day (BID) | ORAL | Status: DC
  Administered 2022-06-20 – 2022-06-30 (×20): 1000 mg via ORAL
  Filled 2022-06-20 (×22): qty 10

## 2022-06-20 NOTE — Progress Notes (Addendum)
Speech Language Pathology Treatment: Dysphagia  Patient Details Name: Calvin [name] MRN: [id number] DOB: 1951/08/29 Today's Date: 06/20/2022 Time:  1050-1130     Assessment / Plan / Recommendation Clinical Impression  Pt seen today for ongoing assessment of swallowing, education w/ aspiration precautions and swallowing strategies, and toleration of his dysphagia diet recommended post 2nd MBSS yesterday. TC w/ Daughter post session was had to update her on the MBSS results and POC; recommendations.   Per 2nd MBSS on 06/20/2022: "Pt appears to present w/ MOD-SEVERE oropharyngeal phase Dysphagia w/ Neuromuscular, and potential Cognitive, deficits resulting laryngeal Penetration during and after trials(pharyngeal residue after) at this MBSS today -- no immediate aspiration noted. Pt appears at MOD risk for Aspiration (and for Pulmonary decline) w/ current swallowing presentation.  Pt also presents w/ risk for inability to meet his nutrition needs orally d/t the extent of the oral phase deficits/Time needed.  Pt's MBSS results are slightly improved from a pharyngeal phase from his last MBSS on 06/07/2022. Recommendations and results were discussed w/ MD/NSG, Team members post study, and a trial oral diet was agreed upon by MD.   At this session today, education re: swallowing/dysphagia and his MBSS, diet consistency rec'd, and general aspiration precautions including swallowing strategies of 2nd, f/u Dry swallow w/ Cough at end of po's to aid protection of his airway. Discussed swallowing Pills CRUSHED in PUREE -- for all Pill swallowing.  Pt has a Baseline of Dysphagia from previous MBSS.     Pt was educated on the need for full Upright sitting; this was demonstrated along w/ midline sitting by this Clinician positioning pt (correctly) Upright in bed for the po trials. Pt consumed trials of Nectar liquids via Cup and tsp trials of Puree helping to feed self which reduces risk for aspiration. Pt followed  the swallowing strategy of f/u, Dry swallow (to reduce/clear oropharyngeal residue) given Min+ cues; Time b/t trials. No overt coughing noted; mildly wet vocal quality noted x2 b/t trials -- instructed pt to use strong throat clearing if feeling/hearing a wet vocal quality. At end of the po trials, instructed pt on using a Cough to clear any potential laryngeal penetration(residue) as seen on the MBSS yesterday.    Talked w/ pt, Dtr about reducing risks for aspiration by following general aspiration precautions and swallowing strategies; Oral Care prior to and after meals; encouraged Supervision at meals for follow through w/ both. No straws. Recommended reducing distractions including Talking at meals and clearing mouth fully b/t Small bites/sips. Also discussed food consistencies and options, preparation, and use of condiments to flavor foods also w/ the Puree diet. ALL Pills to be swallowed using a Puree.  Discussed the use of Nectar consistency at this time; the consistency for safer swallowing to reduce aspiration risk (as was seen on the recent MBSS yesterday).   Handout/signs posted in room for use and to take w/ him to SNF.      Recommend continue a Dysphagia level 1(Puree foods w/ gravies) w/ Nectar liquids- No straws; aspiration precautions; oral care before/after meals; swallowing strategies: 2nd f/u, Dry swallow and throat clear/cough during/end of oral intake. Pills CRUSHED in a Puree for safer swallowing. Education completed w/ pt, Dtr. No further questions. F/u ST services at next venue of care for ongoing education and monitoring of status. Potential repeat MBSS in next 4-5 weeks post acuity of illness. MD agreed. NSG updated.   MD and Dietician are monitoring need for PEG placement for nutritional support; Palliative Care  is following for Calvin [name].        HPI HPI: Pt is [name] admitted for small bowel obstruction and abdominal distention. PMH includes: CVA w L sided weakness, dCHF,  seizures, CAD, HLD, CKD3a, COPD, tobacco abuse, depression, anxiety, and ogilive's syndrome.  Surgery consulted: "abdominal pain, distension, nausea, and emesis thought to be secondary to possible small bowel obstruction also with stable appearance of chronic colonic distension, complicated by significant comorbid disease.".   CT of Abd: Lower chest: Stable small solid pulmonary nodule of the right lower  lobe measuring 5 mm on series 4, image 1. New mild linear nodular  opacity of the left lower lobe.  CXR on 06/05/2022: Enlarged cardiopericardial silhouette with pulmonary vascular  congestion.   Surgery is following for decompressive colonoscopy now; colostomy completed.  Oropharyngeal phase Dysphagia dx'd: 2 MBSSs completed this admit.      SLP Plan  All goals met (f/u at next venue of care for ongoing toleration of diet; education)      Recommendations for follow up therapy are one component of a multi-disciplinary discharge planning process, led by the attending physician.  Recommendations may be updated based on patient status, additional functional criteria and insurance authorization.    Recommendations  Diet recommendations: Dysphagia 1 (puree);Nectar-thick liquid Liquids provided via: Cup;No straw Medication Administration: Crushed with puree Supervision: Patient able to self feed;Staff to assist with self feeding;Intermittent supervision to cue for compensatory strategies (some trouble using utensils) Compensations: Minimize environmental distractions;Slow rate;Small sips/bites;Lingual sweep for clearance of pocketing;Multiple dry swallows after each bite/sip;Follow solids with liquid;Clear throat intermittently (Cough at end of oral intake) Postural Changes and/or Swallow Maneuvers: Out of bed for meals;Seated upright 90 degrees;Upright 30-60 min after meal                General recommendations:  (PT/OT following; Dietician; Palliative Care ongoing) Oral Care Recommendations:  Oral care BID;Oral care before and after PO;Staff/trained caregiver to provide oral care (support) Follow Up Recommendations: Skilled nursing-short term rehab (<3 hours/day) (next venue of care for ongoing support; education) Assistance recommended at discharge: Intermittent Supervision/Assistance (feeding support; follow through w/ precautions and strategies) SLP Visit Diagnosis: Dysphagia, oropharyngeal phase (R13.12) (risk for aspiration and aspiration pneumonia) Plan: All goals met (f/u at next venue of care for ongoing toleration of diet; education)             Orinda Kenner, [place], [place]; [place] [phone] (ascom) Eivin Mascio  06/20/2022, 12:30 PM

## 2022-06-20 NOTE — Progress Notes (Signed)
Nutrition Follow Up Note   DOCUMENTATION CODES:   Not applicable  INTERVENTION:   Continue TPN per pharmacy- provides 101g/day protein and 1938kcal/day   Daily weights   Nepro Shake po TID, each supplement provides 425 kcal and 19 grams protein  48 hour calorie count   NUTRITION DIAGNOSIS:   Inadequate oral intake related to acute illness as evidenced by other (comment) (pt on NPO/clear liquid diet since admission).  GOAL:   Patient will meet greater than or equal to 90% of their needs -met   MONITOR:   PO intake, Supplement acceptance, Labs, Weight trends, Skin, I & O's, TPN  ASSESSMENT:   71 y.o. male with medical history significant of Ogilvie's syndrome, ileus, hypertension, hyperlipidemia, COPD, MI, stroke with mild left-sided weakness, GERD, gout, depression with anxiety, seizure, DDD, CAD, MI,  alcohol abuse, CKD stage IIIa, dCHF, colon cancer and thyroid cancer who is admitted with colonic ileus secondary to Ogilvie syndrome and possible SBO.  -Pt s/p robotic assisted laparoscopic loop transverse colostomy 10/13  Pt remains on TPN at goal rate. Refeed labs stable. Triglycerides wnl. Pt seen by SLP and initiated on a dysphagia 1/nectar thick diet. Will complete a 48 hour calorie count on patient to determine if he is eating enough to meet his estimated needs. Pt may require PEG tube placement if his oral intake remains poor. Pt is getting Nepro supplements. Per chart, pt is up ~11lbs since admission. Volume status discussed with MD, will hold on lasix for now as pt with worsening renal function. Pt is having ostomy function. Pt will need SNF at discharge.    Medications reviewed and include: lovenox, insulin, nicotine, protonix  Labs reviewed: K 4.4 wnl, BUN 47(H), creat 1.48(H), P 4.4 wnl, Mg 2.4 wnl Triglycerides 85- 10/16 Vitamin A- 19.4(L), vitamin E 7.3(L), B12 993(H)- 9/26 Hgb 10.3(L), Hct 32.8(L)- 10/15 Cbgs- 120, 116 x 24 hrs  Diet Order:   Diet Order              DIET - DYS 1 Room service appropriate? Yes with Assist; Fluid consistency: Nectar Thick  Diet effective now                  EDUCATION NEEDS:   No education needs have been identified at this time  Skin:  Skin Assessment: Reviewed RN Assessment (incision abdomen)  Last BM:  10/17- type 7- 245m via ostomy  Height:   Ht Readings from Last 1 Encounters:  06/16/22 '5\' 11"'  (1.803 m)    Weight:   Wt Readings from Last 1 Encounters:  06/20/22 99.9 kg    Ideal Body Weight:  78 kg  BMI:  Body mass index is 30.73 kg/m.  Estimated Nutritional Needs:   Kcal:  1900-2200kcal/day  Protein:  95-110g/day  Fluid:  1.9-2.2L/day  CKoleen DistanceMS, RD, LDN Please refer to [facility] Partnershipfor RD and/or RD on-call/weekend/after hours pager

## 2022-06-20 NOTE — Progress Notes (Addendum)
Palliative Care Progress Note, Assessment & Plan   [name]       Date: 06/20/2022 DOB: 1951-04-01  Age: 71 y.o. MRN#: [id number] Attending Physician: Loletha Grayer, MD Primary Care Physician: Pcp, No Admit Date: 05/28/2022  Reason for Consultation/Follow-up: Establishing goals of care  Subjective: Patient is lying in bed in no apparent distress.  He acknowledges my presence and is able to make his wishes known.  He complains of pain in his left arm.  He shares this is not in pain but chronic pain after his stroke about a year ago.  He is requesting Aspercreme, which I placed/massaged into his left forearm and hand.  HPI: Patient is a 71 year old male with a past medical history significant for Ogilvie syndrome, ileus, HTN, HLD, COPD, stroke (mild left-sided weakness), GERD, gout, depression with anxiety, seizures, CAD, MI, alcohol abuse, CKD (3A), D CHF, colon cancer, and thyroid cancer who was admitted on 05/28/2022 from skilled nursing facility with complaints of nausea/vomiting/abdominal distention and pain for 2 days.  CT of abdomen revealed small bowel obstruction with transition point in the right lower abdomen suspicious for adhesion.  General surgery was consulted and NG tube was placed.  During course of hospitalization NG tube has been removed and replaced numerous times.  Patient had robotic assisted laparoscopic transverse loop colostomy creation due to recurrent/recalcitrant Ogilvie's syndrome.  PMT was consulted to discuss goals of care.  Summary of counseling/coordination of care: After reviewing the patient's chart and assessing the patient at bedside, I attempted to elicit goals and wishes important to the patient.    Palliative medicine is a specialized form of care  designed to relieve suffering for patients and families experiencing chronic illnesses.  I outlined that I can help patient determine goals of his care, discussed advanced care planning documentation, help with symptom management, and provide emotional support for he and his family.   Therapeutic silence and active listening provided for patient to share his thoughts and emotions regarding current medical situation.  He shares he wants to get out of the hospital.  When asked about his current understanding of his medical situation, patient shares that he has a new bag to help with the pain in his stomach.    He also shares he knows he needs to drink water to keep up with his nutrition.  Discussed that if patient is unable to take nutrition/hydration by mouth, then alternate means would be evaluated.  Discussed potential of placing a PEG tube.  Patient shared no opinions or thoughts on a PEG tube.  I discussed the risks and benefits of PEG tube placement.  Again, patient had no comments or input during this discussion.  When asked his thoughts on a PEG tube, patient declined to respond.  I also discussed his CODE STATUS.  Reviewed use of CPR, defibrillation, and mechanical ventilation in the event of a cardiopulmonary arrest.  When asked his thoughts on his CODE STATUS, patient again declined to respond.  Thus, full code remains.  PMT will continue to follow patient hospitalization.  Outpatient palliative services discussed in detail with patient.  Patient shared this would "be okay".  TOC referral placed.  PMT will  continue to monitor the patient during this hospitalization. Pt is reluctant to engage in [place] discussions but was agreeable in continuing to meet with PMT to discuss [place] during his hospitalization.  Code Status: Full code  Prognosis: Unable to determine  Discharge Planning: [facility] for rehab with Palliative care service follow-up  Physical Exam Vitals reviewed.   Constitutional:      General: He is not in acute distress.    Appearance: He is not ill-appearing.  HENT:     Head: Normocephalic.  Cardiovascular:     Rate and Rhythm: Normal rate.     Heart sounds: Normal heart sounds.  Pulmonary:     Effort: Pulmonary effort is normal.  Abdominal:     General: Bowel sounds are normal.     Tenderness: There is no abdominal tenderness. There is no guarding.  Skin:    General: Skin is warm and dry.  Neurological:     Mental Status: He is alert and oriented to person, place, and time.  Psychiatric:        Mood and Affect: Mood normal. Mood is not anxious.        Behavior: Behavior normal.             Palliative Assessment/Data: 40%    Total Time 35 minutes  Greater than 50%  of this time was spent counseling and coordinating care related to the above assessment and plan.  Thank you for allowing the Palliative Medicine Team to assist in the care of this patient.  Williams Ilsa Iha, FNP-BC Palliative Medicine Team Team Phone # [phone]

## 2022-06-20 NOTE — Progress Notes (Signed)
Progress Note   Patient: Calvin [name] GUR:[id number] DOB: [birth year] DOA: 05/28/2022     23 DOS: the patient was seen and examined on 06/20/2022   Brief hospital course:  Calvin [name] is a 71 y.o. male with medical history significant of Ogilvie's syndrome, ileus, hypertension, hyperlipidemia, COPD, stroke with mild left-sided weakness, GERD, gout, depression with anxiety, seizure, CAD, MI,  alcohol abuse, CKD stage IIIa, dCHF, colon cancer, thyroid cancer, who presents from his facility with Nausea, vomiting, abdominal distention, abdominal pain for 2 days. Found to have new small bowel obstruction on CT abdomen with transition point in the right lower abdomen suspicious for adhesion. General surgery was consulted and NG tube was placed.  Patient also developed left-sided tingling and numbness and facial droop while in ED. CT head and MRI brain was negative for any acute intracranial abnormality and do reflect a chronic changes. 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  MRI of the cervical spine showed C3-C4 moderate to severe spinal stenosis, seen by neurosurgery, recommended outpatient follow-up.  9/25: Continued to have significant colonic distention.  NG suction continued. The patient was transferred to the stepdown unit for neostigmine dosing on 05/31/2022 and 06/01/2022.   Patient had a colonoscopy decompression on 9/29 and 10/3, followed with rectal tube. Tube feeding started on 10/30-10/3.  10/4.  Modified barium swallow showed a significant risk of aspiration.  TPN started after PICC line placed.  10/10.  Patient currently on TPN, GI cannot place PEG tube due to worsening due to abdominal distention.  Patient will need a total colectomy in order to survive.  Dr. Roosevelt Locks spoke with Dr. Doyne Keel from [facility] and he did not believe that he was a good surgical candidate and can touch back with them in a week if not improved.  10/12.  Patient considering major surgical  intervention requiring colectomy and ileostomy and feeding tube.    10/13. Dr Dahlia Byes took to the OR for robotic assisted laparoscopic transverse loop colostomy creation.  10/16.  Surgical team cleared for starting liquids.  Need to get speech therapy for a swallow evaluation.   started on dysphagia diet with nectar thick liquids but still high risk for aspiration.  06/20/2022.  Need to see how much she is able to intake.  May end up needing a PEG.  Assessment and Plan: * Ogilvie syndrome General surgery does not think this is bowel obstruction.  Gastroenterology gave neostigmine infusion on 05/31/2022 and on 06/01/22.  Decompressive colonoscopy procedure on 06/02/2022 and on 06/06/2022 and left a rectal tube in. Patient did not do well with barium swallow exam.  Patient currently on TPN.  Dr. Dahlia Byes took to the operating room for robotic assisted transverse colon diverting colostomy on 06/16/2022.  Repeat swallow evaluation had improved on 06/19/2022.  Now on dysphagia diet with nectar thickened liquids.  Still increased risk of aspiration.  May end up needing a PEG depending on how much he is able to intake.  Calorie count will start this evening.  Acute kidney injury superimposed on CKD ([place]) AKI on CKD stage IIIa.  Creatinine 1.48 today.  Creatinine was 0.90 a few days ago.  Continue TPN.  Paroxysmal atrial flutter (HCC) Currently rate controlled and in normal sinus rhythm.  Had paroxysmal atrial fibrillation and atrial flutter while he was in the ICU, earlier in the hospital course.  On as needed IV metoprolol.  We will hold off on major anticoagulation today until we determine if the patient needs a  PEG or not.  DVT prophylaxis ordered.  Cervical spinal stenosis MRI showing multilevel cervical spondylosis most pronounced at C3-C4 where moderate to severe canal stenosis and severe bilateral foraminal stenosis.  Follow-up with Dr. Izora Ribas as outpatient  Chronic diastolic CHF (congestive heart  failure) ([place]) -Last EF 50% -Hold Lasix   Seizures (HCC) IV Keppra and Depakote will be switched over to oral.    Aspiration pneumonia ([place]) Completed 5 days of Zosyn on 06/05/2022.  HTN (hypertension) Blood pressure stable off of medications.  Respiratory distress Respiratory distress on 06/01/2022 with upper airway congestion.  Improved with Decadron and racemic epinephrine infusion.  Currently not on oxygen.  CAD (coronary artery disease) No chest pain.  Troponin negative x2   HLD (hyperlipidemia) Hold Zocor  COPD (chronic obstructive pulmonary disease) (HCC) Continue nebulizers  Tobacco abuse -nicotine patch down to 14 mcg.  Depression with anxiety Hold home oral Xanax and Zoloft -As needed IV Ativan for anxiety  Dysphagia Did not do well with last swallow evaluation.  Patient to have another swallow evaluation today or tomorrow.  Neuropathy Restarted gabapentin        Subjective: Patient having some abdominal pain.  No nausea or vomiting.  Did have a little congestion while talking with me.  Admitted with Ogilvie's syndrome.  Had surgery on 06/16/2022.  Physical Exam: Vitals:   06/19/22 1936 06/20/22 0347 06/20/22 0500 06/20/22 0749  BP: 125/60 (!) 94/58  (!) 106/56  Pulse: 92 75  79  Resp: '18 16  16  '$ Temp: 98.4 F (36.9 C) 98.5 F (36.9 C)  98.6 F (37 C)  TempSrc: Oral Oral  Oral  SpO2: 98% 96%  98%  Weight:   99.9 kg   Height:       Physical Exam HENT:     Head: Normocephalic.     Mouth/Throat:     Pharynx: No oropharyngeal exudate.  Eyes:     General: Lids are normal.     Conjunctiva/sclera: Conjunctivae normal.  Cardiovascular:     Rate and Rhythm: Normal rate and regular rhythm.     Heart sounds: Normal heart sounds, S1 normal and S2 normal.  Pulmonary:     Breath sounds: No decreased breath sounds, wheezing, rhonchi or rales.  Abdominal:     Palpations: Abdomen is soft.     Tenderness: There is generalized abdominal tenderness.   Musculoskeletal:     Right lower leg: Swelling present.     Left lower leg: Swelling present.  Skin:    General: Skin is warm.     Findings: No rash.  Neurological:     Mental Status: He is alert.     Data Reviewed: Creatinine 1.48  Family Communication: Updated patient's daughter on the phone  Disposition: Status is: Inpatient Remains inpatient appropriate because: Just started on a diet in the afternoon on 06/19/2022.  Need to see how much intake he will have.  May end up needing a PEG.  Planned Discharge Destination: Rehab    Time spent: 27 minutes Case discussed with dietitian and calorie count will be started this evening Case discussed with speech pathology  Author: Loletha Grayer, MD 06/20/2022 2:25 PM  For on call review [email or web address].

## 2022-06-20 NOTE — Progress Notes (Signed)
Physical Therapy Treatment Patient Details Name: Calvin [name] MRN: [id number] DOB: [birth year] Today's Date: 06/20/2022   History of Present Illness Patient is a 71 year old male with medical history significant of Ogilvie's syndrome, ileus, hypertension, hyperlipidemia, COPD, stroke with mild left-sided weakness, GERD, gout, depression with anxiety, seizure, CAD, MI,  alcohol abuse, CKD stage IIIa, dCHF, colon cancer, thyroid cancer, who presents from his facility with Nausea, vomiting, abdominal distention, abdominal pain for 2 days. Found to have new small bowel obstruction. He developed L side tingling and numbness and facial droop while in the ED, head CT and MRI of brain was negative for acute abnormalitites. s/p laparoscopic transverse loop colostomy creation 10/13    PT Comments    Patient is agreeable to PT with encouragement. Max A required for bed mobility with fair sitting balance. Max A required for incremental scooting transfer towards head of bed. He refused to get up to the chair today. Limited by generalized weakness and limited activity tolerance. Recommend to continue PT to maximize independence. SNF recommended at discharge.    Recommendations for follow up therapy are one component of a multi-disciplinary discharge planning process, led by the attending physician.  Recommendations may be updated based on patient status, additional functional criteria and insurance authorization.  Follow Up Recommendations  Skilled nursing-short term rehab (<3 hours/day) Can patient physically be transported by private vehicle: No   Assistance Recommended at Discharge Frequent or constant Supervision/Assistance  Patient can return home with the following Assistance with cooking/housework;[name];Assist for transportation;Direct supervision/assist for medications management;Direct supervision/assist for financial management;Two people to help with walking and/or  transfers;Two people to help with bathing/dressing/bathroom   Equipment Recommendations  None recommended by PT    Recommendations for Other Services       Precautions / Restrictions Precautions Precautions: Fall Restrictions Weight Bearing Restrictions: No     Mobility  Bed Mobility Overal bed mobility: Needs Assistance Bed Mobility: Supine to Sit, Sit to Supine     Supine to sit: Max assist Sit to supine: Max assist   General bed mobility comments: assistance for trunk and BLE support. verbal cues for technique and sequencing. significant increased time required    Transfers Overall transfer level: Needs assistance                Lateral/Scoot Transfers: Max assist General transfer comment: patient required maximal assistance for incremental scooting transfer along edge of bed. cues for technique. he refsued to get up to the chair    Ambulation/Gait                   Stairs             Wheelchair Mobility    Modified Rankin (Stroke Patients Only)       Balance Overall balance assessment: Needs assistance Sitting-balance support: Feet supported Sitting balance-Leahy Scale: Fair                                      Cognition Arousal/Alertness: Awake/alert Behavior During Therapy: Flat affect Overall Cognitive Status: No family/caregiver present to determine baseline cognitive functioning                                          Exercises General Exercises - Lower  Extremity Ankle Circles/Pumps: AAROM, Strengthening, Both, 5 reps, Supine Long Arc Quad: AAROM, Strengthening, Both, 5 reps, Seated Other Exercises Other Exercises: cues for exercise technique    General Comments        Pertinent Vitals/Pain Pain Assessment Faces Pain Scale: Hurts a little bit Pain Location: left hand, abdomen Pain Descriptors / Indicators: Discomfort Pain Intervention(s): Limited activity within patient's  tolerance, Monitored during session    Home Living                          Prior Function            PT Goals (current goals can now be found in the care plan section) Acute Rehab PT Goals Patient Stated Goal: to get better PT Goal Formulation: With patient Time For Goal Achievement: 07/03/22 Potential to Achieve Goals: Fair Progress towards PT goals: Progressing toward goals    Frequency    Min 2X/week      PT Plan Current plan remains appropriate    Co-evaluation              AM-PAC PT "6 Clicks" Mobility   Outcome Measure  Help needed turning from your back to your side while in a flat bed without using bedrails?: A Lot Help needed moving from lying on your back to sitting on the side of a flat bed without using bedrails?: A Lot Help needed moving to and from a bed to a chair (including a wheelchair)?: A Lot Help needed standing up from a chair using your arms (e.g., wheelchair or bedside chair)?: Total Help needed to walk in hospital room?: Total Help needed climbing 3-5 steps with a railing? : Total 6 Click Score: 9    End of Session   Activity Tolerance: Patient limited by fatigue Patient left: in bed;with call bell/phone within reach;with bed alarm set Nurse Communication: Mobility status PT Visit Diagnosis: Muscle weakness (generalized) (M62.81);Difficulty in walking, not elsewhere classified (R26.2)     Time: 5364-6803 PT Time Calculation (min) (ACUTE ONLY): 27 min  Charges:  $Therapeutic Activity: 23-37 mins                     [name], PT, MPT    [name] 06/20/2022, 1:07 PM

## 2022-06-20 NOTE — Progress Notes (Signed)
[place] Hospital Day(s): 23.   Post op day(s): 4 Days Post-Op.   Interval History:  Patient seen and examined No acute events or new complaints overnight.  Patient resting comfortably; unreliable historian No significant complaints  Renal function elevated but stable compared to previous days; sCr - 1.48; UO - 1200 ccs No electrolyte derangements Having colostomy output  On DYS 1 diet; SLP has evaluated  Vital signs in last 24 hours: [min-max] current  Temp:  [97.8 F (36.6 C)-98.5 F (36.9 C)] 98.5 F (36.9 C) (10/17 0347) Pulse Rate:  [75-92] 75 (10/17 0347) Resp:  [16-19] 16 (10/17 0347) BP: (94-125)/(58-61) 94/58 (10/17 0347) SpO2:  [96 %-99 %] 96 % (10/17 0347) Weight:  [99.9 kg] 99.9 kg (10/17 0500)     Height: '5\' 11"'$  (180.3 cm) Weight: 99.9 kg BMI (Calculated): 30.74   Intake/Output last 2 shifts:  10/16 0701 - 10/17 0700 In: 1276.2 [I.V.:1040.3; IV Piggyback:235.9] Out: 1400 [Urine:1200; Stool:200]   Physical Exam:  Constitutional: alert, cooperative and no distress  Respiratory: breathing non-labored at rest  Cardiovascular: regular rate and sinus rhythm  Gastrointestinal: Soft, distension improved, he does not appear overtly tender, loop colostomy in LUQ is patent with gas and stool in bag.  Integumentary: Laparoscopic incisions are CDI with dermabond, no erythema or drainage   Labs:     Latest Ref Rng & Units 06/18/2022    5:03 AM 06/17/2022   10:20 AM 06/15/2022    1:56 AM  CBC  WBC 4.0 - 10.5 K/uL 7.6  11.2  6.3   Hemoglobin 13.0 - 17.0 g/dL 10.3  11.1  11.9   Hematocrit 39.0 - 52.0 % 32.8  35.8  38.7   Platelets 150 - 400 K/uL 208  232  268       Latest Ref Rng & Units 06/20/2022    4:15 AM 06/19/2022    5:40 AM 06/18/2022    5:03 AM  CMP  Glucose 70 - 99 mg/dL 128  115  123   BUN 8 - 23 mg/dL 47  41  43   Creatinine 0.61 - 1.24 mg/dL 1.48  1.30  1.47   Sodium 135 - 145 mmol/L 141  141  142    Potassium 3.5 - 5.1 mmol/L 4.4  4.5  4.2   Chloride 98 - 111 mmol/L 108  106  107   CO2 22 - 32 mmol/L '29  30  27   '$ Calcium 8.9 - 10.3 mg/dL 8.3  8.2  8.4   Total Protein 6.5 - 8.1 g/dL  6.0    Total Bilirubin 0.3 - 1.2 mg/dL  0.6    Alkaline Phos 38 - 126 U/L  61    AST 15 - 41 U/L  17    ALT 0 - 44 U/L  22       Imaging studies:  No new pertinent imaging studies   Assessment/Plan:  71 y.o. male with ROBF 4 Days Post-Op s/p robotic assisted laparoscopic transverse loop colostomy creation for recurrent/recalcitrant Ogilvie's Syndrome   - Okay to continue DYS diet as tolerated; monitor tolerance - Continue PICC + TPN for now; wean once feasible and tolerating PO - Monitor colostomy output; WOC on board - Monitor abdominal examination   - Pain control prn; antiemetics prn   - Continue to work with therapies as feasible   - Further management per primary service   - General surgery will follow peripherally for now; Please call with questions/concerns  All of the above findings and recommendations were discussed with the medical team.   -- Edison Simon, PA-C [facility] 06/20/2022, 7:18 AM M-F: 7am - 4pm

## 2022-06-20 NOTE — Consult Note (Signed)
PHARMACY - TOTAL PARENTERAL NUTRITION CONSULT NOTE   Indication:  Intolerance to enteral feeding  Patient Measurements: Height: '5\' 11"'$  (180.3 cm) Weight: 99.9 kg (220 lb 4.8 oz) IBW/kg (Calculated) : 75.3 TPN AdjBW (KG): 92.6 Body mass index is 30.73 kg/m.  Assessment:  Patient is a 71 y/o M with medical history including Ogilvie's syndrome, HTN, HLD, COPD, CVA, GERD, gout, depression / anxiety, seizures, CAD, EtOH use disorder, CKD, diastolic CHF, colon cancer, thyroid cancer who is admitted with resolved SBO but now with worsening pseudoobstruction. GI consulted. Patient failed to respond to conservative measures with methylnaltrexone and neostigmine x 2. Patient underwent decompressive colonoscopy on 3/54 complicated by colonic distension post-procedurally. Patient underwent repeat decompressive colonoscopy on 10/3. Patient had been on tube feeds at trickle rate. Pharmacy consulted to initiate TPN to help meet nutritional goals given acute abdominal issues and intolerance to enteral nutrition.  10/10: -Elevated K and Phos. -GI unable to place PEG tube -Patient received IV contrast 10/9.  10/11: -K and Phos have normalized  10/12: -Scr back to  baseline (Scr = 1.03) -Cl elevated -Na trending up  10/13: -Labs and renal function have normalized -Robotic assisted laparoscopic loop transverse colostomy  10/16: -Phosphorus and magnesium on upper end of normal -Scr elevated from baseline (Scr = 1.3)  10/17: -Dysphagia level 1 diet ordered -Scr up from yesterday to 1.48   Glucose / Insulin: Pre-diabetic range Hgb A1c (5.8% on 05/28/22). Last 24 hrs: 2 units insulin SSI. Electrolytes: WNL Renal: Scr 1.47>>1.47>>1.3>>1.48 Hepatic: WNL Intake / Output; MIVF: NS @ 10 ml/hr GI Imaging: 9/24 CT abdomen / pelvis: SBO and chronic colonic ileus or pseudo-obstruction 9/26 Abdominal X-ray: Air-filled distention of large and small bowel loops favoring ileus 9/27 CT abdomen / pelvis: No  evidence of SBO 9/29 Abdominal X-ray: Severe dilatation of the colon as can be seen with pseudo-obstruction/colonic ileus 9/30 Abdominal X-ray: Significant interval improvement in colonic distension, which has mostly resolved 10/1 Abdominal X-ray: Persistent gaseous dilation of the colon, though mildly improved compared to 06/02/2022 10/2 Abdominal X-ray: Worsened gaseous distention of the colon 10/3 Interval placement of colonic decompression tube with decreased colonic gaseous distention. 10/9 Gaseous distention of transverse and proximal descending colon with interval worsening. 10/12: abdominal x-ray: Slight improvement in moderate distention of the ascending and transverse colon. No evidence of bowel obstruction. 10/16: abdominal x-ray: Nonobstructive bowel gas pattern. Persistent retained oral contrast throughout the colon and rectum. Moderate gaseous distention of the colon is unchanged. GI Surgeries / Procedures:  9/29: Decompressive colonoscopy 10/3: Decompressive colonoscopy 10/13: Robotic assisted laparoscopic loop transverse colostomy Central access: DL PICC placed 10/4 TPN start date: 10/4  Nutritional Goals: Goal TPN rate is 60 mL/hr (provides101 g of protein and 1938 kcals per day)  RD Assessment: Estimated Needs Total Energy Estimated Needs: 1900-2200kcal/day Total Protein Estimated Needs: 95-110g/day Total Fluid Estimated Needs: 1.9-2.2L/day  Current Nutrition:  Dysphagia level 1 diet  Plan:  --Per discussion with RD, attempting to concentrate TPN d/t fluid restriction (hx of HF) --10/16: Scr increase 0.90>>1.47>>1.3>>1.48- watch --Continue TPN at 60 mL/hr (goal rate) Goal rate TPN will provide: Protein: 101 g (70 g/L of Clinisol 15%) Dextrose: 346 g (24%) Lipids: 36 g (36 g/L) Kcals / day: 1938 Fluids: 1.4 L --Electrolytes in TPN: Na 66mq/L, Ca 54m/L, K 30 mEq/L, Phos 9 mmol/L and Mg 3 mEq/L. Cl:Ac 1:2  --Add standard MVI and trace elements to TPN and  thiamine 100 mg x 3 days (completed) --Continue Moderate q6h SSI and adjust  as needed  --Monitor TPN labs on Mon/Thurs   Lorin Picket 06/20/2022,7:24 AM

## 2022-06-21 DIAGNOSIS — I2510 Atherosclerotic heart disease of native coronary artery without angina pectoris: Secondary | ICD-10-CM | POA: Diagnosis not present

## 2022-06-21 DIAGNOSIS — Z789 Other specified health status: Secondary | ICD-10-CM

## 2022-06-21 DIAGNOSIS — K56609 Unspecified intestinal obstruction, unspecified as to partial versus complete obstruction: Secondary | ICD-10-CM | POA: Diagnosis not present

## 2022-06-21 DIAGNOSIS — K5981 Ogilvie syndrome: Secondary | ICD-10-CM | POA: Diagnosis not present

## 2022-06-21 DIAGNOSIS — I5032 Chronic diastolic (congestive) heart failure: Secondary | ICD-10-CM | POA: Diagnosis not present

## 2022-06-21 LAB — BASIC METABOLIC PANEL
Anion gap: 8 (ref 5–15)
BUN: 53 mg/dL — ABNORMAL HIGH (ref 8–23)
CO2: 28 mmol/L (ref 22–32)
Calcium: 8.5 mg/dL — ABNORMAL LOW (ref 8.9–10.3)
Chloride: 107 mmol/L (ref 98–111)
Creatinine, Ser: 1.51 mg/dL — ABNORMAL HIGH (ref 0.61–1.24)
GFR, Estimated: 48 mL/min — ABNORMAL LOW (ref >60)
Glucose, Bld: 134 mg/dL — ABNORMAL HIGH (ref 70–99)
Potassium: 4.2 mmol/L (ref 3.5–5.1)
Sodium: 143 mmol/L (ref 135–145)

## 2022-06-21 LAB — GLUCOSE, CAPILLARY
Glucose-Capillary: 115 mg/dL — ABNORMAL HIGH (ref 70–99)
Glucose-Capillary: 127 mg/dL — ABNORMAL HIGH (ref 70–99)
Glucose-Capillary: 129 mg/dL — ABNORMAL HIGH (ref 70–99)
Glucose-Capillary: 143 mg/dL — ABNORMAL HIGH (ref 70–99)

## 2022-06-21 LAB — HEMOGLOBIN: Hemoglobin: 9.1 g/dL — ABNORMAL LOW (ref 13.0–17.0)

## 2022-06-21 MED ORDER — TAMSULOSIN HCL 0.4 MG PO CAPS
0.4000 mg | ORAL_CAPSULE | Freq: Every evening | ORAL | Status: DC
  Administered 2022-06-21 – 2022-06-29 (×9): 0.4 mg via ORAL
  Filled 2022-06-21 (×9): qty 1

## 2022-06-21 MED ORDER — ASPIRIN 81 MG PO CHEW
81.0000 mg | CHEWABLE_TABLET | Freq: Every day | ORAL | Status: DC
  Administered 2022-06-21 – 2022-06-30 (×10): 81 mg via ORAL
  Filled 2022-06-21 (×10): qty 1

## 2022-06-21 MED ORDER — SERTRALINE HCL 50 MG PO TABS
50.0000 mg | ORAL_TABLET | Freq: Every day | ORAL | Status: DC
  Administered 2022-06-21 – 2022-06-30 (×10): 50 mg via ORAL
  Filled 2022-06-21 (×10): qty 1

## 2022-06-21 MED ORDER — RISAQUAD PO CAPS
1.0000 | ORAL_CAPSULE | Freq: Every day | ORAL | Status: DC
  Administered 2022-06-21 – 2022-06-30 (×10): 1 via ORAL
  Filled 2022-06-21 (×10): qty 1

## 2022-06-21 MED ORDER — ALUM & MAG HYDROXIDE-SIMETH 200-200-20 MG/5ML PO SUSP
30.0000 mL | Freq: Two times a day (BID) | ORAL | Status: DC | PRN
  Administered 2022-06-28 – 2022-06-30 (×2): 30 mL via ORAL
  Filled 2022-06-21 (×2): qty 30

## 2022-06-21 MED ORDER — TRACE MINERALS CU-MN-SE-ZN 300-55-60-3000 MCG/ML IV SOLN
INTRAVENOUS | Status: AC
  Filled 2022-06-21: qty 672

## 2022-06-21 MED ORDER — SIMVASTATIN 20 MG PO TABS
10.0000 mg | ORAL_TABLET | Freq: Every day | ORAL | Status: DC
  Administered 2022-06-21 – 2022-06-29 (×9): 10 mg via ORAL
  Filled 2022-06-21 (×9): qty 1

## 2022-06-21 MED ORDER — DICLOFENAC SODIUM 1 % EX GEL
4.0000 g | Freq: Four times a day (QID) | CUTANEOUS | Status: DC
  Administered 2022-06-21 – 2022-06-30 (×36): 4 g via TOPICAL
  Filled 2022-06-21 (×2): qty 100

## 2022-06-21 MED ORDER — ALPRAZOLAM 0.5 MG PO TABS
0.5000 mg | ORAL_TABLET | Freq: Two times a day (BID) | ORAL | Status: DC | PRN
  Administered 2022-06-25 – 2022-06-30 (×3): 0.5 mg via ORAL
  Filled 2022-06-21 (×3): qty 1

## 2022-06-21 MED ORDER — TIOTROPIUM BROMIDE MONOHYDRATE 18 MCG IN CAPS
18.0000 ug | ORAL_CAPSULE | Freq: Every day | RESPIRATORY_TRACT | Status: DC
  Administered 2022-06-21 – 2022-06-30 (×10): 18 ug via RESPIRATORY_TRACT
  Filled 2022-06-21 (×4): qty 5

## 2022-06-21 MED ORDER — GABAPENTIN 600 MG PO TABS
600.0000 mg | ORAL_TABLET | Freq: Three times a day (TID) | ORAL | Status: DC
  Administered 2022-06-21 – 2022-06-30 (×28): 600 mg via ORAL
  Filled 2022-06-21 (×28): qty 1

## 2022-06-21 NOTE — Progress Notes (Addendum)
Calorie Count Note  48 hour calorie count ordered.  Diet: dysphagia 1 with nectar thick liquids Supplements: Nepro Shake po TID, each supplement provides 425 kcal and 19 grams protein; Hormel Shake TID, each supplement provides 520 kcals and 22 grams protein; Magic cup TID with meals, each supplement provides 290 kcal and 9 grams of protein   Pt sitting up in bed, drinking tea at time of visit. Noted meal tray in front of pt, however, untouched. 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 Assisted pt with eating lunch meals; pt consumed about 10% of mac and cheese and 1/3 of pureed chicken. Also put Hormel Shake in a cup so pt could feed himself.   Per pt, he does not like the puree food but states "I know I have to eat something". Discussed purpose of calorie count and discussed that PEG would be recommended if pt unable to meet needs PO. Pt verbalized understanding.  10/18 Breakfast: 298 kcals, 24 grams protein Lunch: 893 kcals, 51 grams protein Dinner: 76 kcals, 8 grams protein  Total intake: 1267 kcal (67% of minimum estimated needs)  83 grams protein (87% of minimum estimated needs)  Nutrition Dx: Inadequate oral intake related to acute illness as evidenced by other (comment) (pt on NPO/clear liquid diet since admission); ongoing  Goal: Patient will meet greater than or equal to 90% of their needs; progressing   Intervention:   Continue TPN per pharmacy- provides 101g/day protein and 1938kcal/day    Daily weights    Nepro Shake po TID, each supplement provides 425 kcal and 19 grams protein   48 hour calorie count   Magic cup TID with meals, each supplement provides 290 kcal and 9 grams of protein   Hormel Shake TID, each supplement provides 520 kcals and 22 grams protein  Feeding assistance with meals  Loistine Chance, RD, LDN, [place] Registered Dietitian II Certified Diabetes Care  and Education Specialist Please refer to [facility] for RD and/or RD on-call/weekend/after hours pager

## 2022-06-21 NOTE — Progress Notes (Signed)
Physical Therapy Treatment Patient Details Name: Calvin [name] MRN: [id number] DOB: 04-05-1951 Today's Date: 06/21/2022   History of Present Illness Patient is a 71 year old male with medical history significant of Ogilvie's syndrome, ileus, hypertension, hyperlipidemia, COPD, stroke with mild left-sided weakness, GERD, gout, depression with anxiety, seizure, CAD, MI,  alcohol abuse, CKD stage IIIa, dCHF, colon cancer, thyroid cancer, who presents from his facility with Nausea, vomiting, abdominal distention, abdominal pain for 2 days. Found to have new small bowel obstruction. He developed L side tingling and numbness and facial droop while in the ED, head CT and MRI of brain was negative for acute abnormalitites. s/p laparoscopic transverse loop colostomy creation 10/13    PT Comments    Pt still with relatively flat affect, but seemingly more interactive during today' session than documentation indicates he has been.  He was able to perform b/l LE exercises relatively well and showed good effort and quality of movement, he was specifically pleased with how well he felt he did with R LE exercises.  He continues to c/o L hand numbness/pain but did show decent grip and open/close control. Dinner tray arrived during exercises, he was very interested in eating, assisted pt with getting dinner set up after exercises.    Recommendations for follow up therapy are one component of a multi-disciplinary discharge planning process, led by the attending physician.  Recommendations may be updated based on patient status, additional functional criteria and insurance authorization.  Follow Up Recommendations  Skilled nursing-short term rehab (<3 hours/day) Can patient physically be transported by private vehicle: No   Assistance Recommended at Discharge Frequent or constant Supervision/Assistance  Patient can return home with the following Assistance with cooking/housework;Help with stairs or ramp for  entrance;Assist for transportation;Direct supervision/assist for medications management;Direct supervision/assist for financial management;Two people to help with walking and/or transfers;Two people to help with bathing/dressing/bathroom   Equipment Recommendations  None recommended by PT    Recommendations for Other Services       Precautions / Restrictions Precautions Precautions: Fall Restrictions Weight Bearing Restrictions: No     Mobility  Bed Mobility               General bed mobility comments: dinner arrives during bed exercises, interested in eating    Transfers                        Ambulation/Gait                   Stairs             Wheelchair Mobility    Modified Rankin (Stroke Patients Only)       Balance                                            Cognition Arousal/Alertness: Awake/alert Behavior During Therapy: Flat affect Overall Cognitive Status: Within Functional Limits for tasks assessed                                          Exercises General Exercises - Lower Extremity Ankle Circles/Pumps: AROM, 10 reps Quad Sets: Strengthening, 10 reps Heel Slides: AROM, 10 reps (with resitsed leg ext) Hip ABduction/ADduction: Strengthening, 10 reps Straight Leg Raises: AAROM,  5 reps    General Comments        Pertinent Vitals/Pain Pain Assessment Pain Assessment: Faces Faces Pain Scale: Hurts little more Pain Location: left hand, abdomen    Home Living                          Prior Function            PT Goals (current goals can now be found in the care plan section) Progress towards PT goals: Progressing toward goals    Frequency    Min 2X/week      PT Plan Current plan remains appropriate    Co-evaluation              AM-PAC PT "6 Clicks" Mobility   Outcome Measure  Help needed turning from your back to your side while in a flat bed  without using bedrails?: A Lot Help needed moving from lying on your back to sitting on the side of a flat bed without using bedrails?: A Lot Help needed moving to and from a bed to a chair (including a wheelchair)?: A Lot Help needed standing up from a chair using your arms (e.g., wheelchair or bedside chair)?: Total Help needed to walk in hospital room?: Total Help needed climbing 3-5 steps with a railing? : Total 6 Click Score: 9    End of Session   Activity Tolerance: Patient limited by fatigue Patient left: with call bell/phone within reach;with bed alarm set Nurse Communication: Mobility status (colostomy needed attention) PT Visit Diagnosis: Muscle weakness (generalized) (M62.81);Difficulty in walking, not elsewhere classified (R26.2)     Time: 4628-6381 PT Time Calculation (min) (ACUTE ONLY): 16 min  Charges:  $Therapeutic Exercise: 8-22 mins                     Kreg Shropshire, DPT 06/21/2022, 6:12 PM

## 2022-06-21 NOTE — Plan of Care (Signed)

## 2022-06-21 NOTE — TOC Progression Note (Signed)
Transition of Care [facility]) - Progression Note    Patient Details  Name: Calvin [name] MRN: [id number] Date of Birth: Nov 18, 1950  [facility] CM/SW Contact  Beverly Sessions, RN Phone Number: 06/21/2022, 2:10 PM  Clinical Narrative:     At this time per palliative note discharge planning : [place] for rehab with Palliative care service follow-up  Spoke with daughter [name] she confirms the above and wishes to move forward with [facility] and not returning to [facility].  MD notified. Updated [name] at [facility] Expected Discharge Plan: [place] Barriers to Discharge: Continued Medical Work up  Expected Discharge Plan and Services Expected Discharge Plan: [place]   Discharge Planning Services: CM Consult Post Acute Care Choice: Resumption of Svcs/PTA Provider, [place] Living arrangements for the past 2 months: [place]                 DME Arranged: N/A DME Agency: NA       HH Arranged: NA HH Agency: NA         Social Determinants of Health (SDOH) Interventions    Readmission Risk Interventions    06/02/2022   12:02 PM 10/17/2021   10:47 AM  Readmission Risk Prevention Plan  Transportation Screening Complete Complete  PCP or Specialist Appt within 3-5 Days  Complete  Social Work Consult for [place] Planning/Counseling  Complete  Palliative Care Screening  Not Applicable  Medication Review Press photographer) Complete Complete  PCP or Specialist appointment within 3-5 days of discharge Complete   HRI or [place] Complete   SW Recovery Care/Counseling Consult Complete   Palliative Care Screening Complete   Skilled Nursing Facility Complete

## 2022-06-21 NOTE — Consult Note (Signed)
PHARMACY - TOTAL PARENTERAL NUTRITION CONSULT NOTE   Indication:  Intolerance to enteral feeding  Patient Measurements: Height: '5\' 11"'$  (180.3 cm) Weight: 99.6 kg (219 lb 9.3 oz) IBW/kg (Calculated) : 75.3 TPN AdjBW (KG): 92.6 Body mass index is 30.62 kg/m.  Assessment:  Patient is a 71 y/o M with medical history including Ogilvie's syndrome, HTN, HLD, COPD, CVA, GERD, gout, depression / anxiety, seizures, CAD, EtOH use disorder, CKD, diastolic CHF, colon cancer, thyroid cancer who is admitted with resolved SBO but now with worsening pseudoobstruction. GI consulted. Patient failed to respond to conservative measures with methylnaltrexone and neostigmine x 2. Patient underwent decompressive colonoscopy on 6/96 complicated by colonic distension post-procedurally. Patient underwent repeat decompressive colonoscopy on 10/3. Patient had been on tube feeds at trickle rate. Pharmacy consulted to initiate TPN to help meet nutritional goals given acute abdominal issues and intolerance to enteral nutrition.   10/16: -Phosphorus and magnesium on upper end of normal -Scr elevated from baseline (Scr = 1.3)  10/17: -Dysphagia level 1 diet ordered -Scr up from yesterday to 1.48  10/18: -Scr continuing to trend upward (1.57)   Glucose / Insulin: Pre-diabetic range Hgb A1c (5.8% on 05/28/22). Last 24 hrs:  2 units insulin SSI. Electrolytes: WNL Renal: Scr 1.47>>1.47>>1.3>>1.48>1.57 Hepatic: WNL Intake / Output; MIVF: NS @ 10 ml/hr GI Imaging: 9/24 CT abdomen / pelvis: SBO and chronic colonic ileus or pseudo-obstruction 9/26 Abdominal X-ray: Air-filled distention of large and small bowel loops favoring ileus 9/27 CT abdomen / pelvis: No evidence of SBO 9/29 Abdominal X-ray: Severe dilatation of the colon as can be seen with pseudo-obstruction/colonic ileus 9/30 Abdominal X-ray: Significant interval improvement in colonic distension, which has mostly resolved 10/1 Abdominal X-ray: Persistent  gaseous dilation of the colon, though mildly improved compared to 06/02/2022 10/2 Abdominal X-ray: Worsened gaseous distention of the colon 10/3 Interval placement of colonic decompression tube with decreased colonic gaseous distention. 10/9 Gaseous distention of transverse and proximal descending colon with interval worsening. 10/12: abdominal x-ray: Slight improvement in moderate distention of the ascending and transverse colon. No evidence of bowel obstruction. 10/16: abdominal x-ray: Nonobstructive bowel gas pattern. Persistent retained oral contrast throughout the colon and rectum. Moderate gaseous distention of the colon is unchanged. GI Surgeries / Procedures:  9/29: Decompressive colonoscopy 10/3: Decompressive colonoscopy 10/13: Robotic assisted laparoscopic loop transverse colostomy Central access: DL PICC placed 10/4 TPN start date: 10/4  Nutritional Goals: Goal TPN rate is 60 mL/hr (provides101 g of protein and 1938 kcals per day)  RD Assessment: Estimated Needs Total Energy Estimated Needs: 1900-2200kcal/day Total Protein Estimated Needs: 95-110g/day Total Fluid Estimated Needs: 1.9-2.2L/day  Current Nutrition:  Dysphagia level 1 diet  Plan:  --Per discussion with RD, attempting to concentrate TPN d/t fluid restriction (hx of HF) --10/16: Scr increase 0.90>>1.47>>1.3>>1.48>1.57- watch --Continue TPN at 60 mL/hr (goal rate) Goal rate TPN will provide: Protein: 101 g (70 g/L of Clinisol 15%) Dextrose: 346 g (24%) Lipids: 36 g (36 g/L) Kcals / day: 1938 Fluids: 1.4 L --Electrolytes in TPN: Na 29mq/L, Ca 531m/L, K 30 mEq/L, Phos 9 mmol/L and Mg 3 mEq/L. Cl:Ac 1:2  --Add standard MVI and trace elements to TPN and thiamine 100 mg x 3 days (completed) --Continue Moderate q6h SSI and adjust as needed  --Monitor TPN labs on Mon/Thurs   HoLorin Picket0/18/2023,8:05 AM

## 2022-06-21 NOTE — Progress Notes (Signed)
Palliative Care Progress Note, Assessment & Plan   [name]       Date: 06/21/2022 DOB: 20-Dec-1950  Age: 71 y.o. MRN#: [id number] Attending Physician: Sidney Ace, MD Primary Care Physician: Pcp, No Admit Date: 05/28/2022  Reason for Consultation/Follow-up: Establishing goals of care  Subjective: Patient is resting in bed in no apparent distress.  He is frail.  He acknowledges my presence and is able to make his wishes known. No family is present at bedside. He is minimally interactive but alert and oriented X4.  HPI: Patient is a 71 year old male with a past medical history significant for Ogilvie syndrome, ileus, HTN, HLD, COPD, stroke (mild left-sided weakness), GERD, gout, depression with anxiety, seizures, CAD, MI, alcohol abuse, CKD (3A), D CHF, colon cancer, and thyroid cancer who was admitted on 05/28/2022 from skilled nursing facility with complaints of nausea/vomiting/abdominal distention and pain for 2 days.  CT of abdomen revealed small bowel obstruction with transition point in the right lower abdomen suspicious for adhesion.  General surgery was consulted and NG tube was placed.  During course of hospitalization NG tube has been removed and replaced numerous times.  Patient had robotic assisted laparoscopic transverse loop colostomy creation due to recurrent/recalcitrant Ogilvie's syndrome.   PMT was consulted to discuss goals of care.  Summary of counseling/coordination of care: After reviewing the patient's chart and assessing the patient at bedside, I attempted to discuss goals of care with the patient. We discussed the success of his ostomy surgery.  His ostomy bag was full of gas and I "burped" it.  We reviewed the importance of his nutrition and functional status.   He says he has eaten more since he knows that his nutrition is important now.  We reviewed that there is a 48-hour calorie count where we are monitoring his intake. I again attempted to discuss the potential for a PEG tube but patient declined to engage in this discussion.   When asked what is important to him, he shares "I want to live and get out of here".  He declined to elaborate any further.  Patient offers no further insight, questions, or concerns during our visit.  After visiting with the patient, I spoke with his daughter [name] over the phone.  She shares she is happy to see that he is improving.  She states that she thought he was headed towards hospice at [place] before his surgery.  She says that now that his surgery was successful and he seems stronger she is prepared to have him go to rehab.    We discussed patient' advanced age and co-morbidities. She says she knows he has a serious chronic illness and that he is getting older.  She appreciates that he may need hospice services in the future.  However, for now, she would like for him to go to rehab to try to get a strong as possible.  Goals are clear.  Full code and full scope remain. Family would like STR placement.   PMT will remain available to the patient throughout his hospitalization.  PMT will shadow the patient's chart and monitor peripherally. Please re-engage PMT if patient goals change, at patient/family's request, or if  patient's health deteriorates during hospitalization.  Code Status: Full code  Prognosis: Unable to determine  Discharge Planning: [place] for rehab with Palliative care service follow-up  Physical Exam Vitals reviewed.  Constitutional:      Appearance: He is well-developed.  HENT:     Mouth/Throat:     Mouth: Mucous membranes are moist.  Cardiovascular:     Rate and Rhythm: Normal rate.  Pulmonary:     Effort: Pulmonary effort is normal.  Abdominal:     General: Bowel  sounds are normal.     Palpations: Abdomen is soft.     Tenderness: There is no abdominal tenderness.  Skin:    General: Skin is warm and dry.  Neurological:     Mental Status: He is alert and oriented to person, place, and time.  Psychiatric:        Mood and Affect: Mood normal. Mood is not anxious.        Behavior: Behavior normal.             Palliative Assessment/Data: 50%    Total Time 50 minutes  Greater than 50%  of this time was spent counseling and coordinating care related to the above assessment and plan.  Thank you for allowing the Palliative Medicine Team to assist in the care of this patient.  [place] Ilsa Iha, FNP-BC Palliative Medicine Team Team Phone # [phone]

## 2022-06-21 NOTE — Progress Notes (Signed)
PROGRESS NOTE    Calvin [name]  HQP:[id number] DOB: 10-Sep-1950 DOA: 05/28/2022 PCP: Pcp, No    Brief Narrative:  71 y.o. male with medical history significant of Ogilvie's syndrome, ileus, hypertension, hyperlipidemia, COPD, stroke with mild left-sided weakness, GERD, gout, depression with anxiety, seizure, CAD, MI,  alcohol abuse, CKD stage IIIa, dCHF, colon cancer, thyroid cancer, who presents from his facility with Nausea, vomiting, abdominal distention, abdominal pain for 2 days. Found to have new small bowel obstruction on CT abdomen with transition point in the right lower abdomen suspicious for adhesion. General surgery was consulted and NG tube was placed.   Patient also developed left-sided tingling and numbness and facial droop while in ED. CT head and MRI brain was negative for any acute intracranial abnormality and do reflect a chronic changes. 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  MRI of the cervical spine showed C3-C4 moderate to severe spinal stenosis, seen by neurosurgery, recommended outpatient follow-up.   9/25: Continued to have significant colonic distention.  NG suction continued. The patient was transferred to the stepdown unit for neostigmine dosing on 05/31/2022 and 06/01/2022.   Patient had a colonoscopy decompression on 9/29 and 10/3, followed with rectal tube. Tube feeding started on 10/30-10/3.   10/4.  Modified barium swallow showed a significant risk of aspiration.  TPN started after PICC line placed.   10/10.  Patient currently on TPN, GI cannot place PEG tube due to worsening due to abdominal distention.  Patient will need a total colectomy in order to survive.  Dr. Roosevelt Locks spoke with Dr. Doyne Keel from [facility] and he did not believe that he was a good surgical candidate and can touch back with them in a week if not improved.   10/12.  Patient considering major surgical intervention requiring colectomy and ileostomy and feeding tube.      10/13. Dr Dahlia Byes took to the OR for robotic assisted laparoscopic transverse loop colostomy creation.   10/16.  Surgical team cleared for starting liquids.  Need to get speech therapy for a swallow evaluation.   started on dysphagia diet with nectar thick liquids but still high risk for aspiration.   06/20/2022.  Need to see how much she is able to intake.  May end up needing a PEG. 10/18: Discussed with general surgery.  They have signed off at this time.  Will follow peripherally.  Patient remains on TPN.  Attempting to eat dysphagia 1 diet.   Assessment & Plan:   Principal Problem:   Ogilvie syndrome Active Problems:   Acute kidney injury superimposed on CKD (HCC)   Paroxysmal atrial flutter (HCC)   Chronic diastolic CHF (congestive heart failure) (HCC)   Cervical spinal stenosis   Seizures (HCC)   HTN (hypertension)   Aspiration pneumonia (HCC)   CAD (coronary artery disease)   Respiratory distress   HLD (hyperlipidemia)   COPD (chronic obstructive pulmonary disease) (HCC)   Tobacco abuse   Depression with anxiety   Acute abdominal pain   Neuropathy   Dysphagia   Headache  Ogilvie syndrome General surgery does not think this is bowel obstruction.  Gastroenterology gave neostigmine infusion on 05/31/2022 and on 06/01/22.  Decompressive colonoscopy procedure on 06/02/2022 and on 06/06/2022 and left a rectal tube in. Patient did not do well with barium swallow exam.  Patient currently on TPN.  Dr. Dahlia Byes took to the operating room for robotic assisted transverse colon diverting colostomy on 06/16/2022.  Repeat swallow evaluation had improved on 06/19/2022.  Now on dysphagia diet with nectar thickened liquids.  Still increased risk of aspiration.  May end up needing a PEG depending on how much he is able to intake.  RD following for calorie count.  Continue TPN.   Acute kidney injury superimposed on CKD 3 ([place]) AKI on CKD stage IIIa.  Creatinine 1. 5 1 today.  Creatinine was 0.90 a few  days ago.  Continue TPN.  If kidney function continues to deteriorate will consider nephrology consultation   Paroxysmal atrial flutter (HCC) Currently rate controlled and in normal sinus rhythm.  Had paroxysmal atrial fibrillation and atrial flutter while he was in the ICU, earlier in the hospital course.  On as needed IV metoprolol.  We will hold off on major anticoagulation today until we determine if the patient needs a PEG or not.  DVT prophylaxis ordered.   Cervical spinal stenosis MRI showing multilevel cervical spondylosis most pronounced at C3-C4 where moderate to severe canal stenosis and severe bilateral foraminal stenosis.  Follow-up with Dr. Izora Ribas as outpatient   Chronic diastolic CHF (congestive heart failure) ([place]) -Last EF 50% -Hold Lasix   Seizures ([place]) All antiepileptic switch to oral  Aspiration pneumonia ([place]) Completed 5 days of Zosyn on 06/05/2022.   HTN (hypertension) Blood pressure stable off of medications.   Respiratory distress Respiratory distress on 06/01/2022 with upper airway congestion.  Improved with Decadron and racemic epinephrine infusion.  Currently not on oxygen.   CAD (coronary artery disease) No chest pain.  Troponin negative x2     HLD (hyperlipidemia) Hold Zocor   COPD (chronic obstructive pulmonary disease) (HCC) Continue nebulizers   Tobacco abuse -nicotine patch down to 14 mcg.   Depression with anxiety Can resume p.o. as needed Xanax and Zoloft   Dysphagia Did better on last swallow evaluation.  On dysphagia 1 diet   Neuropathy Restarted gabapentin    DVT prophylaxis: SQ Lovenox Code Status: Full Family Communication: None today Disposition Plan: Status is: Inpatient Remains inpatient appropriate because: Dysphagia, malnutrition on TPN   Level of care: Med-Surg  Consultants:  General surgery  Procedures:  Multiple during hospitalization  Antimicrobials: None   Subjective: Seen and examined.  Garbled  speech but sitting up in bed.  Attempting to eat with assistance of patient care tech.  Objective: Vitals:   06/20/22 1928 06/21/22 0500 06/21/22 0552 06/21/22 0815  BP: 116/63  112/65 114/64  Pulse: 94  81 80  Resp: '20  18 16  '$ Temp: 99.1 F (37.3 C)  98 F (36.7 C) (!) 97.5 F (36.4 C)  TempSrc: Oral  Oral Oral  SpO2: 100%  100% 97%  Weight:  99.6 kg    Height:        Intake/Output Summary (Last 24 hours) at 06/21/2022 1332 Last data filed at 06/21/2022 1018 Gross per 24 hour  Intake 480 ml  Output 1950 ml  Net -1470 ml   Filed Weights   06/19/22 0522 06/20/22 0500 06/21/22 0500  Weight: 98 kg 99.9 kg 99.6 kg    Examination:  General exam: NAD.  Appears chronically ill Respiratory system: Clear to auscultation. Respiratory effort normal. Cardiovascular system: S1-S2, RRR, no murmurs, no pedal edema Gastrointestinal system: Soft, NT/ND, normal bowel sounds Central nervous system: Alert, oriented x2, no focal deficits Extremities: Decreased power symmetrically Skin: No rashes, lesions or ulcers Psychiatry: Judgement and insight appear impaired. Mood & affect flattened.     Data Reviewed: I have personally reviewed following labs and imaging studies  CBC: Recent  Labs  Lab 06/15/22 0156 06/17/22 1020 06/18/22 0503 06/21/22 0512  WBC 6.3 11.2* 7.6  --   HGB 11.9* 11.1* 10.3* 9.1*  HCT 38.7* 35.8* 32.8*  --   MCV 98.5 97.0 98.5  --   PLT 268 232 208  --    Basic Metabolic Panel: Recent Labs  Lab 06/15/22 0156 06/16/22 0550 06/17/22 1020 06/18/22 0503 06/19/22 0540 06/20/22 0415 06/21/22 0512  NA 144 143 139 142 141 141 143  K 4.0 4.2 4.6 4.2 4.5 4.4 4.2  CL 115* 110 106 107 106 108 107  CO2 '24 26 27 27 30 29 28  '$ GLUCOSE 111* 115* 122* 123* 115* 128* 134*  BUN 29* 24* 38* 43* 41* 47* 53*  CREATININE 1.03 0.90 1.47* 1.47* 1.30* 1.48* 1.51*  CALCIUM 9.0 8.9 8.3* 8.4* 8.2* 8.3* 8.5*  MG 1.7 1.8 2.0  --  2.4 2.4  --   PHOS 3.5 3.6 3.6  --  4.6 4.4   --    GFR: Estimated Creatinine Clearance: 52.4 mL/min (A) (by C-G formula based on SCr of 1.51 mg/dL (H)). Liver Function Tests: Recent Labs  Lab 06/15/22 0156 06/17/22 1020 06/19/22 0540  AST 34 23 17  ALT 55* 39 22  ALKPHOS 57 63 61  BILITOT 0.5 0.5 0.6  PROT 6.7 6.1* 6.0*  ALBUMIN 3.4* 2.9* 2.7*   No results for input(s): "LIPASE", "AMYLASE" in the last 168 hours. No results for input(s): "AMMONIA" in the last 168 hours. Coagulation Profile: No results for input(s): "INR", "PROTIME" in the last 168 hours. Cardiac Enzymes: No results for input(s): "CKTOTAL", "CKMB", "CKMBINDEX", "TROPONINI" in the last 168 hours. BNP (last 3 results) No results for input(s): "PROBNP" in the last 8760 hours. HbA1C: No results for input(s): "HGBA1C" in the last 72 hours. CBG: Recent Labs  Lab 06/20/22 0746 06/20/22 1733 06/21/22 0055 06/21/22 0526 06/21/22 1256  GLUCAP 120* 130* 129* 115* 143*   Lipid Profile: Recent Labs    06/19/22 0540  TRIG 85   Thyroid Function Tests: No results for input(s): "TSH", "T4TOTAL", "FREET4", "T3FREE", "THYROIDAB" in the last 72 hours. Anemia Panel: No results for input(s): "VITAMINB12", "FOLATE", "FERRITIN", "TIBC", "IRON", "RETICCTPCT" in the last 72 hours. Sepsis Labs: No results for input(s): "PROCALCITON", "LATICACIDVEN" in the last 168 hours.  No results found for this or any previous visit (from the past 240 hour(s)).       Radiology Studies: No results found.      Scheduled Meds:  sodium chloride   Intravenous Once   Chlorhexidine Gluconate Cloth  6 each Topical Daily   diclofenac Sodium  4 g Topical QID   enoxaparin (LOVENOX) injection  0.5 mg/kg Subcutaneous Q24H   feeding supplement (NEPRO CARB STEADY)  237 mL Oral TID WC   gabapentin  600 mg Oral TID   insulin aspart  0-9 Units Subcutaneous Q6H   levETIRAcetam  1,000 mg Oral BID   lidocaine HCl (PF)  10 mL Other Once   LORazepam  1 mg Intravenous QHS   nicotine  14  mg Transdermal Daily   pantoprazole (PROTONIX) IV  40 mg Intravenous Q12H   sodium chloride flush  10-40 mL Intracatheter B04U   trolamine salicylate   Topical BID   Continuous Infusions:  sodium chloride 10 mL/hr at 06/19/22 1857   TPN ADULT (ION) 60 mL/hr at 06/20/22 1741   TPN ADULT (ION)       LOS: 24 days     Sidney Ace, MD  [facility]   If 7PM-7AM, please contact night-coverage  06/21/2022, 1:32 PM

## 2022-06-22 DIAGNOSIS — K5981 Ogilvie syndrome: Secondary | ICD-10-CM | POA: Diagnosis not present

## 2022-06-22 LAB — COMPREHENSIVE METABOLIC PANEL
ALT: 48 U/L — ABNORMAL HIGH (ref 0–44)
AST: 27 U/L (ref 15–41)
Albumin: 2.7 g/dL — ABNORMAL LOW (ref 3.5–5.0)
Alkaline Phosphatase: 73 U/L (ref 38–126)
Anion gap: 3 — ABNORMAL LOW (ref 5–15)
BUN: 42 mg/dL — ABNORMAL HIGH (ref 8–23)
CO2: 27 mmol/L (ref 22–32)
Calcium: 8.4 mg/dL — ABNORMAL LOW (ref 8.9–10.3)
Chloride: 111 mmol/L (ref 98–111)
Creatinine, Ser: 1.12 mg/dL (ref 0.61–1.24)
GFR, Estimated: >60 mL/min (ref >60)
Glucose, Bld: 121 mg/dL — ABNORMAL HIGH (ref 70–99)
Potassium: 4.3 mmol/L (ref 3.5–5.1)
Sodium: 141 mmol/L (ref 135–145)
Total Bilirubin: 0.5 mg/dL (ref 0.3–1.2)
Total Protein: 5.9 g/dL — ABNORMAL LOW (ref 6.5–8.1)

## 2022-06-22 LAB — GLUCOSE, CAPILLARY
Glucose-Capillary: 113 mg/dL — ABNORMAL HIGH (ref 70–99)
Glucose-Capillary: 116 mg/dL — ABNORMAL HIGH (ref 70–99)
Glucose-Capillary: 119 mg/dL — ABNORMAL HIGH (ref 70–99)
Glucose-Capillary: 125 mg/dL — ABNORMAL HIGH (ref 70–99)
Glucose-Capillary: 134 mg/dL — ABNORMAL HIGH (ref 70–99)

## 2022-06-22 LAB — PHOSPHORUS: Phosphorus: 3.6 mg/dL (ref 2.5–4.6)

## 2022-06-22 LAB — TRIGLYCERIDES: Triglycerides: 83 mg/dL (ref <150)

## 2022-06-22 LAB — MAGNESIUM: Magnesium: 1.9 mg/dL (ref 1.7–2.4)

## 2022-06-22 MED ORDER — TRACE MINERALS CU-MN-SE-ZN 300-55-60-3000 MCG/ML IV SOLN
INTRAVENOUS | Status: AC
  Filled 2022-06-22: qty 700.8

## 2022-06-22 NOTE — Progress Notes (Addendum)
Mobility Specialist - Progress Note   06/22/22 1148  Mobility  Activity Ambulated with assistance in room;Transferred from bed to chair  Level of Assistance Moderate assist, patient does 50-74%  Assistive Device Front wheel walker  Distance Ambulated (ft) 4 ft  Range of Motion/Exercises Active;Right arm;Left arm;Right leg;Left leg  Activity Response Tolerated well  $Mobility charge 1 Mobility     Pt lying in bed upon arrival, utilizing RA. Pt agreeable to session. Does voice pain in L side extending from UE to abdomen. Able to complete bed mobility with modA this date. Once seated EOB, pt participated in LE therex prior to STS attempt from elevated bed height. VC and extra time needed for anterior weight shifting and lateral stepping to recliner. Initial minA +2 for side-stepping but does increase to modA +2 as pt becomes fatigue. Once in chair, pt continued therex with focus on BUE whilst completing ADL tasks and exercises. Pt left in chair with alarm set, needs in reach. RN notified.    Kathee Delton Mobility Specialist 06/22/22, 11:53 AM

## 2022-06-22 NOTE — Final Consult Note (Signed)
[place] Nurse ostomy follow up Patient receiving care in [facility]. Stoma type/location: LUQ colostomy Stomal assessment/size: red, moist, productive Peristomal assessment: not assessed.  Existing pouch intact and did not need changing. Treatment options for stomal/peristomal skin: barrier ring. Output Thin golden brown Ostomy pouching: 2pc. 2 and 3/4 inch system. Supplies and pattern in room. Education provided: none Enrolled patient in [employer] Discharge program: No -- patient to discharge to a SNF where staff will provide care.  Bedside nurse to perform ongoing Ostomy care. [place] nurse will not follow at this time.  Please re-consult the [place] team if needed.  Val Riles, RN, MSN, CWOCN, CNS-BC, pager [phone]

## 2022-06-22 NOTE — Progress Notes (Signed)
Calorie Count Note  48 hour calorie count ordered.  Diet: dysphagia 1 with nectar thick liquids Supplements: Nepro Shake po TID, each supplement provides 425 kcal and 19 grams protein; Hormel Shake TID, each supplement provides 520 kcals and 22 grams protein; Magic cup TID with meals, each supplement provides 290 kcal and 9 grams of protein   Pt sitting up in bed at time of visit. He reports he is not feeling well and "hurt all over" when sitting in the recliner chair earlier today. Pt shares he does not feel like eating as he does not have an appetite and generally feels unwell. Lunch tray in front of pt; encouraged pt to eat. Offered pt tray and pt agreeable to two bites of pudding only. Per discussion with nurse tech and pt, pt did not eat breakfast this morning and consumed little dinner last night (about 20% of meal).   Broached with pt rationale for calorie count and how he would like proceed if he does not eat enough to meet needs PO. Discussed possibility of feeding tube with pt; pt replied he will "think about it". Case discussed with MD, Palliative Care, and RN; attempts have been made to discuss PEG with pt, however, he has not given a definitive answer yet. RD will continue calorie count for one more day, however, suspect pt will not be able to consistently meet his needs PO. Will continue to recommend PEG for feeding if this is within pt's goals of care.   10/18 Breakfast: 298 kcals, 24 grams protein Lunch: 893 kcals, 51 grams protein Dinner: 76 kcals, 8 grams protein   Total intake: 1267 kcal (67% of minimum estimated needs)  83 grams protein (87% of minimum estimated needs)  10/19 Breakfast: none Lunch: 2 bites of pudding (14 kcals) Dinner: 206 kcals, 3 grams protein  Total intake: 220 kcal (12% of minimum estimated needs)  3 grams protein (3% of minimum estimated needs)  Average Total intake: 743 kcal (39% of minimum estimated needs)  43 grams protein (45% of minimum  estimated needs)  Nutrition Dx: Inadequate oral intake related to acute illness as evidenced by other (comment) (pt on NPO/clear liquid diet since admission); ongoing   Goal: Patient will meet greater than or equal to 90% of their needs; progressing   Intervention:   Continue TPN per pharmacy- provides 101g/day protein and 1938kcal/day    Daily weights    Nepro Shake po TID, each supplement provides 425 kcal and 19 grams protein   48 hour calorie count    Magic cup TID with meals, each supplement provides 290 kcal and 9 grams of protein    Hormel Shake TID, each supplement provides 520 kcals and 22 grams protein   Feeding assistance with meals  Loistine Chance, RD, LDN, [place] Registered Dietitian II Certified Diabetes Care and Education Specialist Please refer to [facility] for RD and/or RD on-call/weekend/after hours pager

## 2022-06-22 NOTE — Progress Notes (Signed)
Occupational Therapy Treatment Patient Details Name: Calvin [name] MRN: [id number] DOB: [birth year] Today's Date: 06/22/2022   History of present illness Patient is a 71 year old male with medical history significant of Ogilvie's syndrome, ileus, hypertension, hyperlipidemia, COPD, stroke with mild left-sided weakness, GERD, gout, depression with anxiety, seizure, CAD, MI,  alcohol abuse, CKD stage IIIa, dCHF, colon cancer, thyroid cancer, who presents from his facility with Nausea, vomiting, abdominal distention, abdominal pain for 2 days. Found to have new small bowel obstruction. He developed L side tingling and numbness and facial droop while in the ED, head CT and MRI of brain was negative for acute abnormalitites. s/p laparoscopic transverse loop colostomy creation 10/13   OT comments  Chart reviewed, pt greeted in bed, agreeable to OT evaluation with encouragement. Tx session targeted improving activity tolerance in preparation for future ADL tasks.Improvements noted in STS, pt able to perform 2 attempts with MIN-MOD A with RW. MIN A required at all times in standing with multi modal cueing due to posterior bias. SET UP with intermittent vcs required for grooming tasks. Pt offered to attempt for PO, reports he is not hungry, but reports he will try to eat some dinner. Pt is left as received, all needs met. OT will continue to follow acutely.    Recommendations for follow up therapy are one component of a multi-disciplinary discharge planning process, led by the attending physician.  Recommendations may be updated based on patient status, additional functional criteria and insurance authorization.    Follow Up Recommendations  Skilled nursing-short term rehab (<3 hours/day)    Assistance Recommended at Discharge Frequent or constant Supervision/Assistance  Patient can return home with the following  Two people to help with walking and/or transfers;A lot of help with  bathing/dressing/bathroom;Assistance with cooking/housework;[name];Assist for transportation;Direct supervision/assist for medications management   Equipment Recommendations  Other (comment) (per next venue of care)    Recommendations for Other Services      Precautions / Restrictions Precautions Precautions: Fall Restrictions Weight Bearing Restrictions: No       Mobility Bed Mobility Overal bed mobility: Needs Assistance Bed Mobility: Supine to Sit, Sit to Supine     Supine to sit: Mod assist, HOB elevated Sit to supine: Mod assist, HOB elevated        Transfers Overall transfer level: Needs assistance Equipment used: Rolling walker (2 wheels) Transfers: Sit to/from Stand Sit to Stand: Mod assist (2 attempts, pt requires multi modal cueing for appropriate body mechanics, unable to maintain upright standing withotu at least MIN A provided from therapist due to posterior bias)                 Balance Overall balance assessment: Needs assistance Sitting-balance support: Feet supported Sitting balance-Leahy Scale: Fair   Postural control: Posterior lean Standing balance support: Bilateral upper extremity supported, During functional activity Standing balance-Leahy Scale: Poor Standing balance comment: at least MIN A required in standing due to posterior bias                           ADL either performed or assessed with clinical judgement   ADL Overall ADL's : Needs assistance/impaired     Grooming: Wash/dry hands;Sitting;Set up               Lower Body Dressing: Maximal assistance  Extremity/Trunk Assessment              Vision       Perception     Praxis      Cognition Arousal/Alertness: Awake/alert Behavior During Therapy: Flat affect Overall Cognitive Status: No family/caregiver present to determine baseline cognitive functioning Area of Impairment:  Safety/judgement, Awareness, Problem solving                         Safety/Judgement: Decreased awareness of deficits Awareness: Emergent Problem Solving: Slow processing, Requires verbal cues, Requires tactile cues          Exercises      Shoulder Instructions       General Comments      Pertinent Vitals/ Pain       Pain Assessment Pain Assessment: Faces Faces Pain Scale: Hurts even more Pain Location: L side of stomach Pain Descriptors / Indicators: Grimacing, Constant, Discomfort Pain Intervention(s): Limited activity within patient's tolerance, Monitored during session, Repositioned, Patient requesting pain meds-RN notified  Home Living                                          Prior Functioning/Environment              Frequency  Min 2X/week        Progress Toward Goals  OT Goals(current goals can now be found in the care plan section)  Progress towards OT goals: Progressing toward goals     Plan Discharge plan remains appropriate;Frequency remains appropriate    Co-evaluation                 AM-PAC OT "6 Clicks" Daily Activity     Outcome Measure   Help from another person eating meals?: A Little Help from another person taking care of personal grooming?: A Little Help from another person toileting, which includes using toliet, bedpan, or urinal?: A Lot Help from another person bathing (including washing, rinsing, drying)?: A Lot Help from another person to put on and taking off regular upper body clothing?: A Little Help from another person to put on and taking off regular lower body clothing?: A Lot 6 Click Score: 15    End of Session Equipment Utilized During Treatment: Rolling walker (2 wheels)  OT Visit Diagnosis: Muscle weakness (generalized) (M62.81);Unsteadiness on feet (R26.81)   Activity Tolerance Patient tolerated treatment well   Patient Left in bed;with call bell/phone within reach;with bed  alarm set   Nurse Communication Mobility status        Time: 1421-1440 OT Time Calculation (min): 19 min  Charges: OT General Charges $OT Visit: 1 Visit OT Treatments $Therapeutic Activity: 8-22 mins  Shanon Payor, OTD OTR/L  06/22/22, 4:00 PM

## 2022-06-22 NOTE — Consult Note (Signed)
PHARMACY - TOTAL PARENTERAL NUTRITION CONSULT NOTE   Indication:  Intolerance to enteral feeding  Patient Measurements: Height: '5\' 11"'$  (180.3 cm) Weight: 100.5 kg (221 lb 9 oz) IBW/kg (Calculated) : 75.3 TPN AdjBW (KG): 92.6 Body mass index is 30.9 kg/m.  Assessment:  Patient is a 71 y/o M with medical history including Ogilvie's syndrome, HTN, HLD, COPD, CVA, GERD, gout, depression / anxiety, seizures, CAD, EtOH use disorder, CKD, diastolic CHF, colon cancer, thyroid cancer who is admitted with resolved SBO but now with worsening pseudoobstruction. GI consulted. Patient failed to respond to conservative measures with methylnaltrexone and neostigmine x 2. Patient underwent decompressive colonoscopy on 0/86 complicated by colonic distension post-procedurally. Patient underwent repeat decompressive colonoscopy on 10/3. Patient had been on tube feeds at trickle rate. Pharmacy consulted to initiate TPN to help meet nutritional goals given acute abdominal issues and intolerance to enteral nutrition.   Glucose / Insulin: Pre-diabetic range Hgb A1c (5.8% on 05/28/22). Last 24 hrs 121 - 143:  3 units insulin SSI. Electrolytes: WNL Renal: Scr 0.97>>1.51-->1.12 Hepatic: ALT mildly elevated Intake / Output; MIVF: NS @ 10 ml/hr GI Imaging: 9/24 CT abdomen / pelvis: SBO and chronic colonic ileus or pseudo-obstruction 9/26 Abdominal X-ray: Air-filled distention of large and small bowel loops favoring ileus 9/27 CT abdomen / pelvis: No evidence of SBO 9/29 Abdominal X-ray: Severe dilatation of the colon as can be seen with pseudo-obstruction/colonic ileus 9/30 Abdominal X-ray: Significant interval improvement in colonic distension, which has mostly resolved 10/1 Abdominal X-ray: Persistent gaseous dilation of the colon, though mildly improved compared to 06/02/2022 10/2 Abdominal X-ray: Worsened gaseous distention of the colon 10/3 Interval placement of colonic decompression tube with decreased colonic  gaseous distention. 10/9 Gaseous distention of transverse and proximal descending colon with interval worsening. 10/12: abdominal x-ray: Slight improvement in moderate distention of the ascending and transverse colon. No evidence of bowel obstruction. 10/16: abdominal x-ray: Nonobstructive bowel gas pattern. Persistent retained oral contrast throughout the colon and rectum. Moderate gaseous distention of the colon is unchanged. GI Surgeries / Procedures:  9/29: Decompressive colonoscopy 10/3: Decompressive colonoscopy 10/13: Robotic assisted laparoscopic loop transverse colostomy Central access: DL PICC placed 10/4 TPN start date: 10/4  Nutritional Goals: Goal TPN rate is 60 mL/hr (provides101 g of protein and 1938 kcals per day)  RD Assessment: Estimated Needs Total Energy Estimated Needs: 1900-2200kcal/day Total Protein Estimated Needs: 95-110g/day Total Fluid Estimated Needs: 1.9-2.2L/day  Current Nutrition:  Dysphagia level 1 diet 20% eaten  Plan:  --Continue TPN at 60 mL/hr (goal rate) TPN will provide: Protein: 105 g (70 g/L of Clinisol 15%) Dextrose: 346 g (24%) Lipids: 36 g (36 g/L) Kcals / day: 1955 Fluid volume: 1540 mL --Electrolytes in TPN: Na 30mq/L, Ca 535m/L, K 30 mEq/L, Phos 9 mmol/L and Mg 3 mEq/L. Cl:Ac 1:2  --Add standard MVI and trace elements to TPN  --Continue Moderate q6h SSI and adjust as needed  --Monitor TPN labs on Mon/Thurs   RoDallie Piles0/19/2023,8:44 AM

## 2022-06-22 NOTE — Progress Notes (Signed)
PROGRESS NOTE    Calvin [name]  QKM:[id number] DOB: [birth year] DOA: 05/28/2022 PCP: Pcp, No    Brief Narrative:  71 y.o. male with medical history significant of Ogilvie's syndrome, ileus, hypertension, hyperlipidemia, COPD, stroke with mild left-sided weakness, GERD, gout, depression with anxiety, seizure, CAD, MI,  alcohol abuse, CKD stage IIIa, dCHF, colon cancer, thyroid cancer, who presents from his facility with Nausea, vomiting, abdominal distention, abdominal pain for 2 days. Found to have new small bowel obstruction on CT abdomen with transition point in the right lower abdomen suspicious for adhesion. General surgery was consulted and NG tube was placed.   Patient also developed left-sided tingling and numbness and facial droop while in ED. CT head and MRI brain was negative for any acute intracranial abnormality and do reflect a chronic changes. 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  MRI of the cervical spine showed C3-C4 moderate to severe spinal stenosis, seen by neurosurgery, recommended outpatient follow-up.   9/25: Continued to have significant colonic distention.  NG suction continued. The patient was transferred to the stepdown unit for neostigmine dosing on 05/31/2022 and 06/01/2022.   Patient had a colonoscopy decompression on 9/29 and 10/3, followed with rectal tube. Tube feeding started on 10/30-10/3.   10/4.  Modified barium swallow showed a significant risk of aspiration.  TPN started after PICC line placed.   10/10.  Patient currently on TPN, GI cannot place PEG tube due to worsening due to abdominal distention.  Patient will need a total colectomy in order to survive.  Calvin [name] spoke with Calvin [name] from [name] and he did not believe that he was a good surgical candidate and can touch back with them in a week if not improved.   10/12.  Patient considering major surgical intervention requiring colectomy and ileostomy and feeding tube.      10/13. Calvin [name] took to the OR for robotic assisted laparoscopic transverse loop colostomy creation.   10/16.  Surgical team cleared for starting liquids.  Need to get speech therapy for a swallow evaluation.   started on dysphagia diet with nectar thick liquids but still high risk for aspiration.   06/20/2022.  Need to see how much she is able to intake.  May end up needing a PEG. 10/18: Discussed with general surgery.  They have signed off at this time.  Will follow peripherally.  Patient remains on TPN.  Attempting to eat dysphagia 1 diet.  10/19: Patient's p.o. intake remains poor.  Unable or unwilling to engage in discussion regarding PEG tube placement.  Current plan of care is to optimize as much as possible and discharge to skilled nursing facility with outpatient palliative care following.   Assessment & Plan:   Principal Problem:   Ogilvie syndrome Active Problems:   Acute kidney injury superimposed on CKD (HCC)   Paroxysmal atrial flutter (HCC)   Chronic diastolic CHF (congestive heart failure) (HCC)   Cervical spinal stenosis   Seizures (HCC)   HTN (hypertension)   Aspiration pneumonia (HCC)   CAD (coronary artery disease)   Respiratory distress   HLD (hyperlipidemia)   COPD (chronic obstructive pulmonary disease) (HCC)   Tobacco abuse   Depression with anxiety   Acute abdominal pain   Neuropathy   Dysphagia   Headache  Ogilvie syndrome General surgery does not think this is bowel obstruction.  Gastroenterology gave neostigmine infusion on 05/31/2022 and on 06/01/22.  Decompressive colonoscopy procedure on 06/02/2022 and on 06/06/2022 and left a rectal  tube in. Patient did not do well with barium swallow exam.  Patient currently on TPN.  Calvin [name] took to the operating room for robotic assisted transverse colon diverting colostomy on 06/16/2022.  Repeat swallow evaluation had improved on 06/19/2022.  Now on dysphagia diet with nectar thickened liquids.  Still increased  risk of aspiration.  May end up needing a PEG depending on how much he is able to intake.  RD following for calorie count.  Continue TPN.  Wean as tolerated.  We will revisit question of PEG tube placement.   Acute kidney injury superimposed on CKD 3 ([place]) AKI on CKD stage IIIa.  Creatinine 1.12 today.  Creatinine was 0.90 a few days ago.  Continue TPN.    Paroxysmal atrial flutter (HCC) Currently rate controlled and in normal sinus rhythm.  Had paroxysmal atrial fibrillation and atrial flutter while he was in the ICU, earlier in the hospital course.  On as needed IV metoprolol.  We will hold off on major anticoagulation until we determine if the patient needs a PEG or not.  DVT prophylaxis ordered.   Cervical spinal stenosis MRI showing multilevel cervical spondylosis most pronounced at C3-C4 where moderate to severe canal stenosis and severe bilateral foraminal stenosis.  Follow-up with Calvin. Izora [name] as outpatient   Chronic diastolic CHF (congestive heart failure) (Calvin) -Last EF 50% -Hold Lasix   Seizures ([place]) All antiepileptic switch to oral  Aspiration pneumonia ([place]) Completed 5 days of Zosyn on 06/05/2022.   HTN (hypertension) Blood pressure stable off of medications.   Respiratory distress Respiratory distress on 06/01/2022 with upper airway congestion.  Improved with Decadron and racemic epinephrine infusion.  Currently not on oxygen.   CAD (coronary artery disease) No chest pain.  Troponin negative x2     HLD (hyperlipidemia) Hold Zocor   COPD (chronic obstructive pulmonary disease) (HCC) Continue nebulizers   Tobacco abuse -nicotine patch down to 14 mcg.   Depression with anxiety Can resume p.o. as needed Xanax and Zoloft   Dysphagia Did better on last swallow evaluation.  On dysphagia 1 diet   Neuropathy Restarted gabapentin    DVT prophylaxis: SQ Lovenox Code Status: Full Family Communication: None today Disposition Plan: Status is: Inpatient Remains  inpatient appropriate because: Dysphagia, malnutrition on TPN   Level of care: Med-Surg  Consultants:  General surgery  Procedures:  Multiple during hospitalization  Antimicrobials: None   Subjective: Seen and examined.  More awake this morning.  States he knows he needs to eat more.  States his stomach is hurting near ostomy creation site  Objective: Vitals:   06/21/22 1950 06/22/22 0500 06/22/22 0507 06/22/22 0800  BP: 133/71  116/68 104/61  Pulse: 94  84 79  Resp: '16  18 17  '$ Temp: 98.5 F (36.9 C)  97.9 F (36.6 C) 98.2 F (36.8 C)  TempSrc: Oral  Oral Oral  SpO2: 100%  97% 97%  Weight:  100.5 kg    Height:        Intake/Output Summary (Last 24 hours) at 06/22/2022 1325 Last data filed at 06/22/2022 1000 Gross per 24 hour  Intake 1138.37 ml  Output 2825 ml  Net -1686.63 ml   Filed Weights   06/20/22 0500 06/21/22 0500 06/22/22 0500  Weight: 99.9 kg 99.6 kg 100.5 kg    Examination:  General exam: Frail and chronically ill Respiratory system: Clear to auscultation. Respiratory effort normal. Cardiovascular system: S1-S2, RRR, no murmurs, no pedal edema Gastrointestinal system: Soft, NT/ND, normal bowel sounds, ostomy  Central nervous system: Alert, oriented x2, no focal deficits Extremities: Decreased power symmetrically Skin: No rashes, lesions or ulcers Psychiatry: Judgement and insight appear impaired. Mood & affect flattened.     Data Reviewed: I have personally reviewed following labs and imaging studies  CBC: Recent Labs  Lab 06/17/22 1020 06/18/22 0503 06/21/22 0512  WBC 11.2* 7.6  --   HGB 11.1* 10.3* 9.1*  HCT 35.8* 32.8*  --   MCV 97.0 98.5  --   PLT 232 208  --    Basic Metabolic Panel: Recent Labs  Lab 06/16/22 0550 06/17/22 1020 06/18/22 0503 06/19/22 0540 06/20/22 0415 06/21/22 0512 06/22/22 0545  NA 143 139 142 141 141 143 141  K 4.2 4.6 4.2 4.5 4.4 4.2 4.3  CL 110 106 107 106 108 107 111  CO2 '26 27 27 30 29 28 27   '$ GLUCOSE 115* 122* 123* 115* 128* 134* 121*  BUN 24* 38* 43* 41* 47* 53* 42*  CREATININE 0.90 1.47* 1.47* 1.30* 1.48* 1.51* 1.12  CALCIUM 8.9 8.3* 8.4* 8.2* 8.3* 8.5* 8.4*  MG 1.8 2.0  --  2.4 2.4  --  1.9  PHOS 3.6 3.6  --  4.6 4.4  --  3.6   GFR: Estimated Creatinine Clearance: 71 mL/min (by C-G formula based on SCr of 1.12 mg/dL). Liver Function Tests: Recent Labs  Lab 06/17/22 1020 06/19/22 0540 06/22/22 0545  AST '23 17 27  '$ ALT 39 22 48*  ALKPHOS 63 61 73  BILITOT 0.5 0.6 0.5  PROT 6.1* 6.0* 5.9*  ALBUMIN 2.9* 2.7* 2.7*   No results for input(s): "LIPASE", "AMYLASE" in the last 168 hours. No results for input(s): "AMMONIA" in the last 168 hours. Coagulation Profile: No results for input(s): "INR", "PROTIME" in the last 168 hours. Cardiac Enzymes: No results for input(s): "CKTOTAL", "CKMB", "CKMBINDEX", "TROPONINI" in the last 168 hours. BNP (last 3 results) No results for input(s): "PROBNP" in the last 8760 hours. HbA1C: No results for input(s): "HGBA1C" in the last 72 hours. CBG: Recent Labs  Lab 06/21/22 1256 06/21/22 1840 06/22/22 0122 06/22/22 0508 06/22/22 1211  GLUCAP 143* 127* 119* 125* 134*   Lipid Profile: Recent Labs    06/22/22 0545  TRIG 83   Thyroid Function Tests: No results for input(s): "TSH", "T4TOTAL", "FREET4", "T3FREE", "THYROIDAB" in the last 72 hours. Anemia Panel: No results for input(s): "VITAMINB12", "FOLATE", "FERRITIN", "TIBC", "IRON", "RETICCTPCT" in the last 72 hours. Sepsis Labs: No results for input(s): "PROCALCITON", "LATICACIDVEN" in the last 168 hours.  No results found for this or any previous visit (from the past 240 hour(s)).       Radiology Studies: No results found.      Scheduled Meds:  sodium chloride   Intravenous Once   acidophilus  1 capsule Oral Daily   aspirin  81 mg Oral Daily   Chlorhexidine Gluconate Cloth  6 each Topical Daily   diclofenac Sodium  4 g Topical QID   enoxaparin (LOVENOX)  injection  0.5 mg/kg Subcutaneous Q24H   feeding supplement (NEPRO CARB STEADY)  237 mL Oral TID WC   gabapentin  600 mg Oral TID   insulin aspart  0-9 Units Subcutaneous Q6H   levETIRAcetam  1,000 mg Oral BID   lidocaine HCl (PF)  10 mL Other Once   LORazepam  1 mg Intravenous QHS   nicotine  14 mg Transdermal Daily   pantoprazole (PROTONIX) IV  40 mg Intravenous Q12H   sertraline  50 mg Oral Daily  simvastatin  10 mg Oral QHS   sodium chloride flush  10-40 mL Intracatheter Q12H   tamsulosin  0.4 mg Oral QPM   tiotropium  18 mcg Inhalation Daily   trolamine salicylate   Topical BID   Continuous Infusions:  sodium chloride 10 mL/hr at 06/19/22 1857   TPN ADULT (ION) 60 mL/hr at 06/22/22 0216   TPN ADULT (ION)       LOS: 25 days     Sidney Ace, MD [facility]   If 7PM-7AM, please contact night-coverage  06/22/2022, 1:25 PM

## 2022-06-22 NOTE — Plan of Care (Signed)

## 2022-06-23 DIAGNOSIS — K5981 Ogilvie syndrome: Secondary | ICD-10-CM | POA: Diagnosis not present

## 2022-06-23 LAB — BASIC METABOLIC PANEL
Anion gap: 6 (ref 5–15)
BUN: 32 mg/dL — ABNORMAL HIGH (ref 8–23)
CO2: 28 mmol/L (ref 22–32)
Calcium: 8.7 mg/dL — ABNORMAL LOW (ref 8.9–10.3)
Chloride: 104 mmol/L (ref 98–111)
Creatinine, Ser: 1.16 mg/dL (ref 0.61–1.24)
GFR, Estimated: >60 mL/min (ref >60)
Glucose, Bld: 129 mg/dL — ABNORMAL HIGH (ref 70–99)
Potassium: 4.7 mmol/L (ref 3.5–5.1)
Sodium: 138 mmol/L (ref 135–145)

## 2022-06-23 LAB — GLUCOSE, CAPILLARY
Glucose-Capillary: 114 mg/dL — ABNORMAL HIGH (ref 70–99)
Glucose-Capillary: 126 mg/dL — ABNORMAL HIGH (ref 70–99)
Glucose-Capillary: 129 mg/dL — ABNORMAL HIGH (ref 70–99)

## 2022-06-23 MED ORDER — PANTOPRAZOLE SODIUM 40 MG PO TBEC
40.0000 mg | DELAYED_RELEASE_TABLET | Freq: Every day | ORAL | Status: DC
  Administered 2022-06-24 – 2022-06-30 (×7): 40 mg via ORAL
  Filled 2022-06-23 (×8): qty 1

## 2022-06-23 MED ORDER — TRACE MINERALS CU-MN-SE-ZN 300-55-60-3000 MCG/ML IV SOLN
INTRAVENOUS | Status: AC
  Filled 2022-06-23: qty 700.8

## 2022-06-23 MED ORDER — DRONABINOL 2.5 MG PO CAPS
2.5000 mg | ORAL_CAPSULE | Freq: Two times a day (BID) | ORAL | Status: DC
  Administered 2022-06-23 – 2022-06-26 (×7): 2.5 mg via ORAL
  Filled 2022-06-23 (×7): qty 1

## 2022-06-23 NOTE — Progress Notes (Signed)
Physical Therapy Treatment Patient Details Name: Calvin [name] MRN: [id number] DOB: 1951/08/10 Today's Date: 06/23/2022   History of Present Illness Patient is a 71 year old male with medical history significant of Ogilvie's syndrome, ileus, hypertension, hyperlipidemia, COPD, stroke with mild left-sided weakness, GERD, gout, depression with anxiety, seizure, CAD, MI,  alcohol abuse, CKD stage IIIa, dCHF, colon cancer, thyroid cancer, who presents from his facility with Nausea, vomiting, abdominal distention, abdominal pain for 2 days. Found to have new small bowel obstruction. He developed L side tingling and numbness and facial droop while in the ED, head CT and MRI of brain was negative for acute abnormalitites. s/p laparoscopic transverse loop colostomy creation 10/13    PT Comments    Patient is agreeable to PT with encouragement. The daughter was at the bedside and very encouraging with mobility efforts. She confirmed that the patient did ambulate short distances at baseline with rolling walker. The patient was able to stand x 2 bouts today and walk a short distance in the room with rolling walker. Standing balance is poor with posterior lean with facilitation required for anterior weight shifting. The patient does not like sitting up in the chair. Encouraged patient to elevated head of bed as tolerated for upright conditioning. Recommend to continue PT to maximize independence and facilitate return to prior level of function. SNF recommended at discharge.    Recommendations for follow up therapy are one component of a multi-disciplinary discharge planning process, led by the attending physician.  Recommendations may be updated based on patient status, additional functional criteria and insurance authorization.  Follow Up Recommendations  Skilled nursing-short term rehab (<3 hours/day) Can patient physically be transported by private vehicle: No   Assistance Recommended at Discharge  Frequent or constant Supervision/Assistance  Patient can return home with the following Assistance with cooking/housework;[name];Assist for transportation;Direct supervision/assist for medications management;Direct supervision/assist for financial management;Two people to help with walking and/or transfers;Two people to help with bathing/dressing/bathroom   Equipment Recommendations  None recommended by PT    Recommendations for Other Services       Precautions / Restrictions Precautions Precautions: Fall Restrictions Weight Bearing Restrictions: No     Mobility  Bed Mobility Overal bed mobility: Needs Assistance Bed Mobility: Supine to Sit, Sit to Supine     Supine to sit: Max assist, +2 for physical assistance Sit to supine: Max assist, +2 for physical assistance   General bed mobility comments: assistance for LE and trunk support. verbal cues for safety and technique. increased time and effort required with mobility tasks    Transfers Overall transfer level: Needs assistance   Transfers: Sit to/from Stand Sit to Stand: Max assist          Lateral/Scoot Transfers: +2 physical assistance, Mod assist General transfer comment: 2 bouts of standing performed. +2 person assistance is required for standing with cues for anterior weight shifting and hand placement    Ambulation/Gait Ambulation/Gait assistance: Max assist Gait Distance (Feet): 3 Feet Assistive device: Rolling walker (2 wheels) Gait Pattern/deviations: Decreased stride length, Wide base of support, Leaning posteriorly Gait velocity: decreased     General Gait Details: weight shifting assistance required for short distance ambulation forward and backward. significant posterior lean with standing and cues for anterior weight shifitng. recommend a chair follow if attemting to ambulate away from the bed   Stairs             Wheelchair Mobility    Modified Rankin  (  Stroke Patients Only)       Balance Overall balance assessment: Needs assistance Sitting-balance support: Feet supported Sitting balance-Leahy Scale: Fair Sitting balance - Comments: right lean preference and patient propped on right elbow intermittently due to fatigue Postural control: Posterior lean Standing balance support: During functional activity, Bilateral upper extremity supported Standing balance-Leahy Scale: Zero Standing balance comment: Mod A - Max A required to maintain standing balance with posterior lean. faciliaton for anterior weight shifting                            Cognition Arousal/Alertness: Awake/alert Behavior During Therapy: Flat affect                                   General Comments: patient has multiple complaints (the room is too hot, the a/c is not working, frustrated when therapist turns volume down on the TV). he is able to follow single step commands with increase time for processing        Exercises      General Comments        Pertinent Vitals/Pain Pain Assessment Pain Assessment: Faces Faces Pain Scale: Hurts little more Pain Location: L side of stomach Pain Descriptors / Indicators: Sore Pain Intervention(s): Limited activity within patient's tolerance, Monitored during session, Repositioned    Home Living                          Prior Function            PT Goals (current goals can now be found in the care plan section) Acute Rehab PT Goals Patient Stated Goal: to get better PT Goal Formulation: With patient Time For Goal Achievement: 07/03/22 Potential to Achieve Goals: Fair Progress towards PT goals: Progressing toward goals    Frequency    Min 2X/week      PT Plan Current plan remains appropriate    Co-evaluation              AM-PAC PT "6 Clicks" Mobility   Outcome Measure  Help needed turning from your back to your side while in a flat bed without using  bedrails?: A Lot Help needed moving from lying on your back to sitting on the side of a flat bed without using bedrails?: A Lot Help needed moving to and from a bed to a chair (including a wheelchair)?: A Lot Help needed standing up from a chair using your arms (e.g., wheelchair or bedside chair)?: Total Help needed to walk in hospital room?: Total Help needed climbing 3-5 steps with a railing? : Total 6 Click Score: 9    End of Session   Activity Tolerance: Patient tolerated treatment well Patient left: in bed;with call bell/phone within reach;with bed alarm set;with family/visitor present (daughter present throughout session) Nurse Communication: Mobility status PT Visit Diagnosis: Muscle weakness (generalized) (M62.81);Difficulty in walking, not elsewhere classified (R26.2)     Time: 1308-6578 PT Time Calculation (min) (ACUTE ONLY): 25 min  Charges:  $Therapeutic Activity: 23-37 mins                     [name], PT, MPT    [name] 06/23/2022, 11:51 AM

## 2022-06-23 NOTE — Progress Notes (Addendum)
Nutrition Follow-up  DOCUMENTATION CODES:   Not applicable  INTERVENTION:   D/c calorie count  Continue TPN per pharmacy   Daily weights    Nepro Shake po TID, each supplement provides 425 kcal and 19 grams protein    Magic cup TID with meals, each supplement provides 290 kcal and 9 grams of protein    Hormel Shake TID, each supplement provides 520 kcals and 22 grams protein   Feeding assistance with meals  -If pt desires PEG, consider:  Initiate Osmolite 1.5 @ 20 ml/hr and increase by 10 ml every 12 hours to goal rate of 60 ml/hr.   160 ml free water flush every 4 hours  Tube feeding regimen provides 2160 kcal (100% of needs), 90 grams of protein, and 1097 ml of H2O.  Total free water: 2057 ml daily  NUTRITION DIAGNOSIS:   Inadequate oral intake related to acute illness as evidenced by other (comment) (pt on NPO/clear liquid diet since admission).  Progressing   GOAL:   Patient will meet greater than or equal to 90% of their needs  Unmet via PO intake; TPN continues to meet 100% fo needs  MONITOR:   PO intake, Supplement acceptance, Labs, Weight trends, Skin, I & O's  REASON FOR ASSESSMENT:   Rounds    ASSESSMENT:   71 y.o. male with medical history significant of Ogilvie's syndrome, ileus, hypertension, hyperlipidemia, COPD, MI, stroke with mild left-sided weakness, GERD, gout, depression with anxiety, seizure, DDD, CAD, MI,  alcohol abuse, CKD stage IIIa, dCHF, colon cancer and thyroid cancer who is admitted with colonic ileus secondary to Ogilvie syndrome and possible SBO.  -Pt s/p robotic assisted laparoscopic loop transverse colostomy 10/13  Reviewed I/O's: -1.5 L x 24 hours and +2.2 L since 06/09/22  UOP: 1.9 L x 24 hours  Colostomy output: 75 ml x 24 hours  Pt sitting up in bed at time of visit. Assisted pt in opening containers and setting up meal tray. Per pt, he did not feel very well yesterday and did not eat any dinner (no meal tray tickets  in folder and no meal documentation completed). Pt states he feels like he wants to try to eat something, but does not feel like he is very hungry. Observed pt consume a few bites of oatmeal.   Pt remains on TPN at 60 ml/hr, which provides 1956 kcals, and 105 grams protein, meeting 100% of estimated kcal and protein needs.   Case discussed with MD and palliative care yesterday. Based upon calorie count results, pt unable to meet nutritional needs orally. Pt is currently on TPN and unable to discharge back to SNF on TPN. PEG remains recommendation if this is within pt's goals of care, however, pt has not made a decision regarding PEG. Pt told RD that he would "think about it" yesterday.   Calorie count results below.   10/18 Breakfast: 298 kcals, 24 grams protein Lunch: 893 kcals, 51 grams protein Dinner: 76 kcals, 8 grams protein   Total intake: 1267 kcal (67% of minimum estimated needs)  83 grams protein (87% of minimum estimated needs)   10/19 Breakfast: none Lunch: 2 bites of pudding (14 kcals) Dinner: 206 kcals, 3 grams protein   Total intake: 220 kcal (12% of minimum estimated needs)  3 grams protein (3% of minimum estimated needs)  10/20 Breakfast: a few bites of oatmeal (15 kcals, 0 grams protein) Lunch: nothing documented Dinner: none   Total intake: 15 kcal (12% of minimum estimated needs)  0 grams protein (3% of minimum estimated needs)  Average total intake:  501 kcals (25% of minimum estimated needs) 28 grams protein (29% of minimum estimated needs)  Medications reviewed and include keppra and marinol.   Labs reviewed: CBGS: 113-134 (inpatient orders for glycemic control are 0-9 units insulin aspart eveyr 6 hours).    Diet Order:   Diet Order             DIET - DYS 1 Room service appropriate? Yes with Assist; Fluid consistency: Nectar Thick  Diet effective now                   EDUCATION NEEDS:   No education needs have been identified at this  time  Skin:  Skin Assessment: Skin Integrity Issues: Skin Integrity Issues:: Incisions Incisions: closed abdomen s/p new colostomy  Last BM:  06/22/22 (50 ml via colostomy)  Height:   Ht Readings from Last 1 Encounters:  06/16/22 '5\' 11"'$  (1.803 m)    Weight:   Wt Readings from Last 1 Encounters:  06/23/22 98.9 kg    Ideal Body Weight:  78 kg  BMI:  Body mass index is 30.4 kg/m.  Estimated Nutritional Needs:   Kcal:  1900-2200kcal/day  Protein:  95-110g/day  Fluid:  1.9-2.2L/day    Loistine Chance, RD, LDN, [place] Registered Dietitian II Certified Diabetes Care and Education Specialist Please refer to [facility] for RD and/or RD on-call/weekend/after hours pager

## 2022-06-23 NOTE — Consult Note (Signed)
PHARMACY - TOTAL PARENTERAL NUTRITION CONSULT NOTE   Indication:  Intolerance to enteral feeding  Patient Measurements: Height: '5\' 11"'$  (180.3 cm) Weight: 98.9 kg (218 lb) IBW/kg (Calculated) : 75.3 TPN AdjBW (KG): 92.6 Body mass index is 30.4 kg/m.  Assessment:  Patient is a 71 y/o M with medical history including Ogilvie's syndrome, HTN, HLD, COPD, CVA, GERD, gout, depression / anxiety, seizures, CAD, EtOH use disorder, CKD, diastolic CHF, colon cancer, thyroid cancer who is admitted with resolved SBO but now with worsening pseudoobstruction. GI consulted. Patient failed to respond to conservative measures with methylnaltrexone and neostigmine x 2. Patient underwent decompressive colonoscopy on 2/40 complicated by colonic distension post-procedurally. Patient underwent repeat decompressive colonoscopy on 10/3. Patient had been on tube feeds at trickle rate. Pharmacy consulted to initiate TPN to help meet nutritional goals given acute abdominal issues and intolerance to enteral nutrition.   Glucose / Insulin: Pre-diabetic range Hgb A1c (5.8% on 05/28/22). Last 24 hrs 113 - 129:  1 units insulin SSI. Electrolytes: WNL Renal: Scr 0.97>>1.51-->1.16 Hepatic: ALT mildly elevated Intake / Output; MIVF: NS @ 10 ml/hr GI Imaging: 9/24 CT abdomen / pelvis: SBO and chronic colonic ileus or pseudo-obstruction 9/26 Abdominal X-ray: Air-filled distention of large and small bowel loops favoring ileus 9/27 CT abdomen / pelvis: No evidence of SBO 9/29 Abdominal X-ray: Severe dilatation of the colon as can be seen with pseudo-obstruction/colonic ileus 9/30 Abdominal X-ray: Significant interval improvement in colonic distension, which has mostly resolved 10/1 Abdominal X-ray: Persistent gaseous dilation of the colon, though mildly improved compared to 06/02/2022 10/2 Abdominal X-ray: Worsened gaseous distention of the colon 10/3 Interval placement of colonic decompression tube with decreased colonic  gaseous distention. 10/9 Gaseous distention of transverse and proximal descending colon with interval worsening. 10/12: abdominal x-ray: Slight improvement in moderate distention of the ascending and transverse colon. No evidence of bowel obstruction. 10/16: abdominal x-ray: Nonobstructive bowel gas pattern. Persistent retained oral contrast throughout the colon and rectum. Moderate gaseous distention of the colon is unchanged. GI Surgeries / Procedures:  9/29: Decompressive colonoscopy 10/3: Decompressive colonoscopy 10/13: Robotic assisted laparoscopic loop transverse colostomy Central access: DL PICC placed 10/4 TPN start date: 10/4  Nutritional Goals: Goal TPN rate is 60 mL/hr (provides101 g of protein and 1938 kcals per day)  RD Assessment: Estimated Needs Total Energy Estimated Needs: 1900-2200kcal/day Total Protein Estimated Needs: 95-110g/day Total Fluid Estimated Needs: 1.9-2.2L/day  Current Nutrition:  Dysphagia level 1 diet   Plan:  --Continue TPN at 60 mL/hr (goal rate) TPN will provide: Protein: 105 g (70 g/L of Clinisol 15%) Dextrose: 346 g (24%) Lipids: 36 g (36 g/L) Kcals / day: 1955 Fluid volume: 1540 mL --Electrolytes in TPN: Na 29mq/L, Ca 569m/L, K 10 mEq/L, Phos 9 mmol/L and Mg 3 mEq/L. Cl:Ac 1:2  --Add standard MVI and trace elements to TPN  --stop SSI --Monitor TPN labs on Mon/Thurs   RoDallie Piles0/20/2023,9:01 AM

## 2022-06-23 NOTE — TOC Progression Note (Addendum)
No additional needs assessed.   1:30pm Discussed bed offer for [facility]. Contacted [name] Ramsey from [facility] while in patient's room. She is requesting more information via the [facility]. Progress note sent.   IM notice provided.

## 2022-06-23 NOTE — Progress Notes (Signed)
PROGRESS NOTE    Calvin [name]  KNL:[id number] DOB: 08-Jul-1951 DOA: 05/28/2022 PCP: Pcp, No    Brief Narrative:  71 y.o. male with medical history significant of Ogilvie's syndrome, ileus, hypertension, hyperlipidemia, COPD, stroke with mild left-sided weakness, GERD, gout, depression with anxiety, seizure, CAD, MI,  alcohol abuse, CKD stage IIIa, dCHF, colon cancer, thyroid cancer, who presents from his facility with Nausea, vomiting, abdominal distention, abdominal pain for 2 days. Found to have new small bowel obstruction on CT abdomen with transition point in the right lower abdomen suspicious for adhesion. General surgery was consulted and NG tube was placed.   Patient also developed left-sided tingling and numbness and facial droop while in ED. CT head and MRI brain was negative for any acute intracranial abnormality and do reflect a chronic changes. 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  MRI of the cervical spine showed C3-C4 moderate to severe spinal stenosis, seen by neurosurgery, recommended outpatient follow-up.   9/25: Continued to have significant colonic distention.  NG suction continued. The patient was transferred to the stepdown unit for neostigmine dosing on 05/31/2022 and 06/01/2022.   Patient had a colonoscopy decompression on 9/29 and 10/3, followed with rectal tube. Tube feeding started on 10/30-10/3.   10/4.  Modified barium swallow showed a significant risk of aspiration.  TPN started after PICC line placed.   10/10.  Patient currently on TPN, GI cannot place PEG tube due to worsening due to abdominal distention.  Patient will need a total colectomy in order to survive.  Dr. Roosevelt Locks spoke with Dr. Doyne Keel from [facility] and he did not believe that he was a good surgical candidate and can touch back with them in a week if not improved.   10/12.  Patient considering major surgical intervention requiring colectomy and ileostomy and feeding tube.      10/13. Dr Dahlia Byes took to the OR for robotic assisted laparoscopic transverse loop colostomy creation.   10/16.  Surgical team cleared for starting liquids.  Need to get speech therapy for a swallow evaluation.   started on dysphagia diet with nectar thick liquids but still high risk for aspiration.   06/20/2022.  Need to see how much she is able to intake.  May end up needing a PEG. 10/18: Discussed with general surgery.  They have signed off at this time.  Will follow peripherally.  Patient remains on TPN.  Attempting to eat dysphagia 1 diet.  10/19: Patient's p.o. intake remains poor.  Unable or unwilling to engage in discussion regarding PEG tube placement.  Current plan of care is to optimize as much as possible and discharge to skilled nursing facility with outpatient palliative care following.   Assessment & Plan:   Principal Problem:   Ogilvie syndrome Active Problems:   Acute kidney injury superimposed on CKD (HCC)   Paroxysmal atrial flutter (HCC)   Chronic diastolic CHF (congestive heart failure) (HCC)   Cervical spinal stenosis   Seizures (HCC)   HTN (hypertension)   Aspiration pneumonia (HCC)   CAD (coronary artery disease)   Respiratory distress   HLD (hyperlipidemia)   COPD (chronic obstructive pulmonary disease) (HCC)   Tobacco abuse   Depression with anxiety   Acute abdominal pain   Neuropathy   Dysphagia   Headache  Ogilvie syndrome General surgery does not think this is bowel obstruction.  Gastroenterology gave neostigmine infusion on 05/31/2022 and on 06/01/22.  Decompressive colonoscopy procedure on 06/02/2022 and on 06/06/2022 and left a rectal  tube in. Patient did not do well with barium swallow exam.  Patient currently on TPN.  Dr. Dahlia Byes took to the operating room for robotic assisted transverse colon diverting colostomy on 06/16/2022.  Repeat swallow evaluation had improved on 06/19/2022.  Now on dysphagia diet with nectar thickened liquids.  Still increased  risk of aspiration.  May end up needing a PEG depending on how much he is able to intake.  RD following for calorie count.  Continue TPN.  Wean as tolerated.  We will revisit question of PEG tube placement.  Add Marinol 2.5 mg p.o. twice daily for appetite stimulant effect   Acute kidney injury superimposed on CKD 3 ([place]) AKI on CKD stage IIIa.  Creatinine 1.12 today.  Creatinine was 0.90 a few days ago.  Continue TPN.    Paroxysmal atrial flutter (HCC) Currently rate controlled and in normal sinus rhythm.  Had paroxysmal atrial fibrillation and atrial flutter while he was in the ICU, earlier in the hospital course.  On as needed IV metoprolol.  We will hold off on major anticoagulation until we determine if the patient needs a PEG or not.  DVT prophylaxis ordered.   Cervical spinal stenosis MRI showing multilevel cervical spondylosis most pronounced at C3-C4 where moderate to severe canal stenosis and severe bilateral foraminal stenosis.  Follow-up with Dr. Izora Ribas as outpatient   Chronic diastolic CHF (congestive heart failure) ([place]) -Last EF 50% -Hold Lasix   Seizures ([place]) All antiepileptic switch to oral  Aspiration pneumonia ([place]) Completed 5 days of Zosyn on 06/05/2022.   HTN (hypertension) Blood pressure stable off of medications.   Respiratory distress Respiratory distress on 06/01/2022 with upper airway congestion.  Improved with Decadron and racemic epinephrine infusion.  Currently not on oxygen.   CAD (coronary artery disease) No chest pain.  Troponin negative x2     HLD (hyperlipidemia) Hold Zocor   COPD (chronic obstructive pulmonary disease) (HCC) Continue nebulizers   Tobacco abuse -nicotine patch down to 14 mcg.   Depression with anxiety Can resume p.o. as needed Xanax and Zoloft   Dysphagia Did better on last swallow evaluation.  On dysphagia 1 diet   Neuropathy Restarted gabapentin   DVT prophylaxis: SQ Lovenox Code Status: Full Family  Communication: Daughter [name] [phone] on 10/20 Disposition Plan: Status is: Inpatient Remains inpatient appropriate because: Dysphagia, malnutrition on TPN   Level of care: Med-Surg  Consultants:  General surgery  Procedures:  Multiple during hospitalization  Antimicrobials: None   Subjective: Seen and examined.  More awake this morning.  States he knows he needs to eat more.  No pain complaints this morning  Objective: Vitals:   06/22/22 1956 06/23/22 0449 06/23/22 0501 06/23/22 0814  BP: 139/71  128/73 118/80  Pulse: 88  81 79  Resp: '16  18 20  '$ Temp: 99.3 F (37.4 C)  98 F (36.7 C) 98.3 F (36.8 C)  TempSrc: Oral  Oral Oral  SpO2: 96%  98% 98%  Weight:  98.9 kg    Height:        Intake/Output Summary (Last 24 hours) at 06/23/2022 1111 Last data filed at 06/23/2022 0820 Gross per 24 hour  Intake 512.92 ml  Output 1950 ml  Net -1437.08 ml   Filed Weights   06/21/22 0500 06/22/22 0500 06/23/22 0449  Weight: 99.6 kg 100.5 kg 98.9 kg    Examination:  General exam: Appears frail and chronically ill Respiratory system: Clear to auscultation. Respiratory effort normal. Cardiovascular system: S1-S2, RRR, no  murmurs, no pedal edema Gastrointestinal system: Soft, NT/ND, normal bowel sounds, ostomy Central nervous system: Alert, oriented x2, no focal deficits Extremities: Decreased power symmetrically Skin: No rashes, lesions or ulcers Psychiatry: Judgement and insight appear impaired. Mood & affect flattened.     Data Reviewed: I have personally reviewed following labs and imaging studies  CBC: Recent Labs  Lab 06/17/22 1020 06/18/22 0503 06/21/22 0512  WBC 11.2* 7.6  --   HGB 11.1* 10.3* 9.1*  HCT 35.8* 32.8*  --   MCV 97.0 98.5  --   PLT 232 208  --    Basic Metabolic Panel: Recent Labs  Lab 06/17/22 1020 06/18/22 0503 06/19/22 0540 06/20/22 0415 06/21/22 0512 06/22/22 0545 06/23/22 0447  NA 139   < > 141 141 143 141 138   K 4.6   < > 4.5 4.4 4.2 4.3 4.7  CL 106   < > 106 108 107 111 104  CO2 27   < > '30 29 28 27 28  '$ GLUCOSE 122*   < > 115* 128* 134* 121* 129*  BUN 38*   < > 41* 47* 53* 42* 32*  CREATININE 1.47*   < > 1.30* 1.48* 1.51* 1.12 1.16  CALCIUM 8.3*   < > 8.2* 8.3* 8.5* 8.4* 8.7*  MG 2.0  --  2.4 2.4  --  1.9  --   PHOS 3.6  --  4.6 4.4  --  3.6  --    < > = values in this interval not displayed.   GFR: Estimated Creatinine Clearance: 67.9 mL/min (by C-G formula based on SCr of 1.16 mg/dL). Liver Function Tests: Recent Labs  Lab 06/17/22 1020 06/19/22 0540 06/22/22 0545  AST '23 17 27  '$ ALT 39 22 48*  ALKPHOS 63 61 73  BILITOT 0.5 0.6 0.5  PROT 6.1* 6.0* 5.9*  ALBUMIN 2.9* 2.7* 2.7*   No results for input(s): "LIPASE", "AMYLASE" in the last 168 hours. No results for input(s): "AMMONIA" in the last 168 hours. Coagulation Profile: No results for input(s): "INR", "PROTIME" in the last 168 hours. Cardiac Enzymes: No results for input(s): "CKTOTAL", "CKMB", "CKMBINDEX", "TROPONINI" in the last 168 hours. BNP (last 3 results) No results for input(s): "PROBNP" in the last 8760 hours. HbA1C: No results for input(s): "HGBA1C" in the last 72 hours. CBG: Recent Labs  Lab 06/22/22 0508 06/22/22 1211 06/22/22 1802 06/22/22 2349 06/23/22 0502  GLUCAP 125* 134* 113* 116* 114*   Lipid Profile: Recent Labs    06/22/22 0545  TRIG 83   Thyroid Function Tests: No results for input(s): "TSH", "T4TOTAL", "FREET4", "T3FREE", "THYROIDAB" in the last 72 hours. Anemia Panel: No results for input(s): "VITAMINB12", "FOLATE", "FERRITIN", "TIBC", "IRON", "RETICCTPCT" in the last 72 hours. Sepsis Labs: No results for input(s): "PROCALCITON", "LATICACIDVEN" in the last 168 hours.  No results found for this or any previous visit (from the past 240 hour(s)).       Radiology Studies: No results found.      Scheduled Meds:  sodium chloride   Intravenous Once   acidophilus  1 capsule Oral  Daily   aspirin  81 mg Oral Daily   Chlorhexidine Gluconate Cloth  6 each Topical Daily   diclofenac Sodium  4 g Topical QID   dronabinol  2.5 mg Oral BID AC   enoxaparin (LOVENOX) injection  0.5 mg/kg Subcutaneous Q24H   feeding supplement (NEPRO CARB STEADY)  237 mL Oral TID WC   gabapentin  600 mg Oral TID  insulin aspart  0-9 Units Subcutaneous Q6H   levETIRAcetam  1,000 mg Oral BID   lidocaine HCl (PF)  10 mL Other Once   LORazepam  1 mg Intravenous QHS   nicotine  14 mg Transdermal Daily   pantoprazole (PROTONIX) IV  40 mg Intravenous Q12H   sertraline  50 mg Oral Daily   simvastatin  10 mg Oral QHS   sodium chloride flush  10-40 mL Intracatheter Q12H   tamsulosin  0.4 mg Oral QPM   tiotropium  18 mcg Inhalation Daily   trolamine salicylate   Topical BID   Continuous Infusions:  sodium chloride 10 mL/hr at 06/19/22 1857   TPN ADULT (ION) 60 mL/hr at 06/23/22 0304   TPN ADULT (ION)       LOS: 26 days     Sidney Ace, MD [facility]   If 7PM-7AM, please contact night-coverage  06/23/2022, 11:11 AM

## 2022-06-23 NOTE — Plan of Care (Signed)
  Problem: Activity: Goal: Risk for activity intolerance will decrease Outcome: Progressing   Problem: Pain Managment: Goal: General experience of comfort will improve Outcome: Progressing   Problem: Safety: Goal: Ability to remain free from injury will improve Outcome: Progressing   

## 2022-06-23 NOTE — Care Management Important Message (Signed)
Important Message  Patient Details  Name: Calvin [name] MRN: [id number] Date of Birth: [birth year]   Medicare Important Message Given:  Yes     Juliann Pulse A Umeka Wrench 06/23/2022, 2:47 PM

## 2022-06-24 DIAGNOSIS — K5981 Ogilvie syndrome: Secondary | ICD-10-CM | POA: Diagnosis not present

## 2022-06-24 LAB — GLUCOSE, CAPILLARY
Glucose-Capillary: 132 mg/dL — ABNORMAL HIGH (ref 70–99)
Glucose-Capillary: 134 mg/dL — ABNORMAL HIGH (ref 70–99)
Glucose-Capillary: 134 mg/dL — ABNORMAL HIGH (ref 70–99)
Glucose-Capillary: 136 mg/dL — ABNORMAL HIGH (ref 70–99)
Glucose-Capillary: 136 mg/dL — ABNORMAL HIGH (ref 70–99)

## 2022-06-24 LAB — BASIC METABOLIC PANEL
Anion gap: 7 (ref 5–15)
BUN: 32 mg/dL — ABNORMAL HIGH (ref 8–23)
CO2: 26 mmol/L (ref 22–32)
Calcium: 8.8 mg/dL — ABNORMAL LOW (ref 8.9–10.3)
Chloride: 104 mmol/L (ref 98–111)
Creatinine, Ser: 1.07 mg/dL (ref 0.61–1.24)
GFR, Estimated: >60 mL/min (ref >60)
Glucose, Bld: 133 mg/dL — ABNORMAL HIGH (ref 70–99)
Potassium: 4.7 mmol/L (ref 3.5–5.1)
Sodium: 137 mmol/L (ref 135–145)

## 2022-06-24 MED ORDER — TRACE MINERALS CU-MN-SE-ZN 300-55-60-3000 MCG/ML IV SOLN
INTRAVENOUS | Status: AC
  Filled 2022-06-24: qty 700.8

## 2022-06-24 NOTE — Consult Note (Signed)
PHARMACY - TOTAL PARENTERAL NUTRITION CONSULT NOTE   Indication:  Intolerance to enteral feeding  Patient Measurements: Height: '5\' 11"'$  (180.3 cm) Weight: 99 kg (218 lb 4.1 oz) IBW/kg (Calculated) : 75.3 TPN AdjBW (KG): 92.6 Body mass index is 30.44 kg/m.  Assessment:  Patient is a 71 y/o M with medical history including Ogilvie's syndrome, HTN, HLD, COPD, CVA, GERD, gout, depression / anxiety, seizures, CAD, EtOH use disorder, CKD, diastolic CHF, colon cancer, thyroid cancer who is admitted with resolved SBO but now with worsening pseudoobstruction. GI consulted. Patient failed to respond to conservative measures with methylnaltrexone and neostigmine x 2. Patient underwent decompressive colonoscopy on 2/72 complicated by colonic distension post-procedurally. Patient underwent repeat decompressive colonoscopy on 10/3. Patient had been on tube feeds at trickle rate. Pharmacy consulted to initiate TPN to help meet nutritional goals given acute abdominal issues and intolerance to enteral nutrition.   Glucose / Insulin: Pre-diabetic range Hgb A1c (5.8% on 05/28/22). Last 24 hrs 126 - 136:  2 units insulin SSI. Electrolytes: WNL Renal: Scr 0.97>>1.51-->1.07 Hepatic: ALT mildly elevated Intake / Output; MIVF: NS @ 10 ml/hr GI Imaging: 9/24 CT abdomen / pelvis: SBO and chronic colonic ileus or pseudo-obstruction 9/26 Abdominal X-ray: Air-filled distention of large and small bowel loops favoring ileus 9/27 CT abdomen / pelvis: No evidence of SBO 9/29 Abdominal X-ray: Severe dilatation of the colon as can be seen with pseudo-obstruction/colonic ileus 9/30 Abdominal X-ray: Significant interval improvement in colonic distension, which has mostly resolved 10/1 Abdominal X-ray: Persistent gaseous dilation of the colon, though mildly improved compared to 06/02/2022 10/2 Abdominal X-ray: Worsened gaseous distention of the colon 10/3 Interval placement of colonic decompression tube with decreased colonic  gaseous distention. 10/9 Gaseous distention of transverse and proximal descending colon with interval worsening. 10/12: abdominal x-ray: Slight improvement in moderate distention of the ascending and transverse colon. No evidence of bowel obstruction. 10/16: abdominal x-ray: Nonobstructive bowel gas pattern. Persistent retained oral contrast throughout the colon and rectum. Moderate gaseous distention of the colon is unchanged. GI Surgeries / Procedures:  9/29: Decompressive colonoscopy 10/3: Decompressive colonoscopy 10/13: Robotic assisted laparoscopic loop transverse colostomy Central access: DL PICC placed 10/4 TPN start date: 10/4  Nutritional Goals: Goal TPN rate is 60 mL/hr (provides101 g of protein and 1938 kcals per day)  RD Assessment: Estimated Needs Total Energy Estimated Needs: 1900-2200kcal/day Total Protein Estimated Needs: 95-110g/day Total Fluid Estimated Needs: 1.9-2.2L/day  Current Nutrition:  Dysphagia level 1 diet   Plan:  --Continue TPN at 60 mL/hr (goal rate) TPN will provide: Protein: 105 g (70 g/L of Clinisol 15%) Dextrose: 346 g (24%) Lipids: 36 g (36 g/L) Kcals / day: 1955 Fluid volume: 1540 mL --Electrolytes in TPN: Na 46mq/L, Ca 54m/L, K 10 mEq/L, Phos 9 mmol/L and Mg 3 mEq/L. Cl:Ac 1:2  --Add standard MVI and trace elements to TPN  --stop SSI --Monitor TPN labs on Mon/Thurs   Marcquis Ridlon A Tiffony Kite 06/24/2022,8:25 AM

## 2022-06-24 NOTE — Progress Notes (Signed)
PROGRESS NOTE    Calvin [name]  TIR:[id number] DOB: 1950/12/09 DOA: 05/28/2022 PCP: Pcp, No    Brief Narrative:  71 y.o. male with medical history significant of Ogilvie's syndrome, ileus, hypertension, hyperlipidemia, COPD, stroke with mild left-sided weakness, GERD, gout, depression with anxiety, seizure, CAD, MI,  alcohol abuse, CKD stage IIIa, dCHF, colon cancer, thyroid cancer, who presents from his facility with Nausea, vomiting, abdominal distention, abdominal pain for 2 days. Found to have new small bowel obstruction on CT abdomen with transition point in the right lower abdomen suspicious for adhesion. General surgery was consulted and NG tube was placed.   Patient also developed left-sided tingling and numbness and facial droop while in ED. CT head and MRI brain was negative for any acute intracranial abnormality and do reflect a chronic changes. 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  MRI of the cervical spine showed C3-C4 moderate to severe spinal stenosis, seen by neurosurgery, recommended outpatient follow-up.   9/25: Continued to have significant colonic distention.  NG suction continued. The patient was transferred to the stepdown unit for neostigmine dosing on 05/31/2022 and 06/01/2022.   Patient had a colonoscopy decompression on 9/29 and 10/3, followed with rectal tube. Tube feeding started on 10/30-10/3.   10/4.  Modified barium swallow showed a significant risk of aspiration.  TPN started after PICC line placed.   10/10.  Patient currently on TPN, GI cannot place PEG tube due to worsening due to abdominal distention.  Patient will need a total colectomy in order to survive.  Dr. Roosevelt Locks spoke with Dr. Doyne Keel from [facility] and he did not believe that he was a good surgical candidate and can touch back with them in a week if not improved.   10/12.  Patient considering major surgical intervention requiring colectomy and ileostomy and feeding tube.      10/13. Dr Dahlia Byes took to the OR for robotic assisted laparoscopic transverse loop colostomy creation.   10/16.  Surgical team cleared for starting liquids.  Need to get speech therapy for a swallow evaluation.   started on dysphagia diet with nectar thick liquids but still high risk for aspiration.   06/20/2022.  Need to see how much she is able to intake.  May end up needing a PEG. 10/18: Discussed with general surgery.  They have signed off at this time.  Will follow peripherally.  Patient remains on TPN.  Attempting to eat dysphagia 1 diet.  10/19: Patient's p.o. intake remains poor.  Unable or unwilling to engage in discussion regarding PEG tube placement.  Current plan of care is to optimize as much as possible and discharge to skilled nursing facility with outpatient palliative care following.   Assessment & Plan:   Principal Problem:   Ogilvie syndrome Active Problems:   Acute kidney injury superimposed on CKD (HCC)   Paroxysmal atrial flutter (HCC)   Chronic diastolic CHF (congestive heart failure) (HCC)   Cervical spinal stenosis   Seizures (HCC)   HTN (hypertension)   Aspiration pneumonia (HCC)   CAD (coronary artery disease)   Respiratory distress   HLD (hyperlipidemia)   COPD (chronic obstructive pulmonary disease) (HCC)   Tobacco abuse   Depression with anxiety   Acute abdominal pain   Neuropathy   Dysphagia   Headache  Ogilvie syndrome General surgery does not think this is bowel obstruction.  Gastroenterology gave neostigmine infusion on 05/31/2022 and on 06/01/22.  Decompressive colonoscopy procedure on 06/02/2022 and on 06/06/2022 and left a rectal  tube in. Patient did not do well with barium swallow exam.  Patient currently on TPN.  Dr. Dahlia Byes took to the operating room for robotic assisted transverse colon diverting colostomy on 06/16/2022.  Repeat swallow evaluation had improved on 06/19/2022.  Now on dysphagia diet with nectar thickened liquids.  Still increased  risk of aspiration.  May end up needing a PEG depending on how much he is able to intake.  RD following for calorie count.  Continue TPN.  Wean as tolerated.  We will revisit question of PEG tube placement.  Marinol added 2.5 mg p.o. twice daily on 10/20 Plan: Continue midodrine Continue TPN Encourage p.o. intake Wean TPN as tolerated   Acute kidney injury superimposed on CKD 3 ([place]) AKI on CKD stage IIIa.  Creatinine 1.07 today.  Slowly improving.  Continue TPN  Paroxysmal atrial flutter (HCC) Currently rate controlled and in normal sinus rhythm.  Had paroxysmal atrial fibrillation and atrial flutter while he was in the ICU, earlier in the hospital course.  On as needed IV metoprolol.  We will hold off on major anticoagulation until we determine if the patient needs a PEG or not.  DVT prophylaxis ordered.   Cervical spinal stenosis MRI showing multilevel cervical spondylosis most pronounced at C3-C4 where moderate to severe canal stenosis and severe bilateral foraminal stenosis.  Follow-up with Dr. Izora Ribas as outpatient   Chronic diastolic CHF (congestive heart failure) ([place]) -Last EF 50% -Hold Lasix   Seizures ([employer]) All antiepileptic switch to oral  Aspiration pneumonia ([place]) Completed 5 days of Zosyn on 06/05/2022.   HTN (hypertension) Blood pressure stable off of medications.   Respiratory distress Respiratory distress on 06/01/2022 with upper airway congestion.  Improved with Decadron and racemic epinephrine infusion.  Currently not on oxygen.   CAD (coronary artery disease) No chest pain.  Troponin negative x2     HLD (hyperlipidemia) Hold Zocor   COPD (chronic obstructive pulmonary disease) (HCC) Continue nebulizers   Tobacco abuse -nicotine patch down to 14 mcg.   Depression with anxiety Can resume p.o. as needed Xanax and Zoloft   Dysphagia Did better on last swallow evaluation.  On dysphagia 1 diet   Neuropathy Restarted gabapentin   DVT prophylaxis:  SQ Lovenox Code Status: Full Family Communication: Daughter [name] [phone] on 10/20 Disposition Plan: Status is: Inpatient Remains inpatient appropriate because: Dysphagia, malnutrition on TPN   Level of care: Med-Surg  Consultants:  General surgery  Procedures:  Multiple during hospitalization  Antimicrobials: None   Subjective: Seen and examined.  More awake this morning.  States his appetite has been poor but has been trying to eat more.  Objective: Vitals:   06/23/22 2006 06/24/22 0500 06/24/22 0607 06/24/22 0900  BP: 121/74  110/67 130/75  Pulse: 86  80 79  Resp: '18  18 18  '$ Temp:   98.2 F (36.8 C) 98 F (36.7 C)  TempSrc:    Oral  SpO2: 100%  96% 99%  Weight:  99 kg    Height:        Intake/Output Summary (Last 24 hours) at 06/24/2022 1306 Last data filed at 06/24/2022 0616 Gross per 24 hour  Intake 1220.29 ml  Output 2850 ml  Net -1629.71 ml   Filed Weights   06/22/22 0500 06/23/22 0449 06/24/22 0500  Weight: 100.5 kg 98.9 kg 99 kg    Examination:  General exam: No acute distress.  Weak voice.  Frail and chronically ill Respiratory system: Bibasilar crackles.  Normal work of  breathing.  Room air Cardiovascular system: S1-S2, RRR, no murmurs, no pedal edema Gastrointestinal system: Soft, NT/ND, normal bowel sounds, ostomy Central nervous system: Alert, oriented x2, no focal deficits Extremities: Decreased power symmetrically Skin: No rashes, lesions or ulcers Psychiatry: Judgement and insight appear impaired. Mood & affect flattened.     Data Reviewed: I have personally reviewed following labs and imaging studies  CBC: Recent Labs  Lab 06/18/22 0503 06/21/22 0512  WBC 7.6  --   HGB 10.3* 9.1*  HCT 32.8*  --   MCV 98.5  --   PLT 208  --    Basic Metabolic Panel: Recent Labs  Lab 06/19/22 0540 06/20/22 0415 06/21/22 0512 06/22/22 0545 06/23/22 0447 06/24/22 0619  NA 141 141 143 141 138 137  K 4.5 4.4 4.2 4.3 4.7  4.7  CL 106 108 107 111 104 104  CO2 '30 29 28 27 28 26  '$ GLUCOSE 115* 128* 134* 121* 129* 133*  BUN 41* 47* 53* 42* 32* 32*  CREATININE 1.30* 1.48* 1.51* 1.12 1.16 1.07  CALCIUM 8.2* 8.3* 8.5* 8.4* 8.7* 8.8*  MG 2.4 2.4  --  1.9  --   --   PHOS 4.6 4.4  --  3.6  --   --    GFR: Estimated Creatinine Clearance: 73.7 mL/min (by C-G formula based on SCr of 1.07 mg/dL). Liver Function Tests: Recent Labs  Lab 06/19/22 0540 06/22/22 0545  AST 17 27  ALT 22 48*  ALKPHOS 61 73  BILITOT 0.6 0.5  PROT 6.0* 5.9*  ALBUMIN 2.7* 2.7*   No results for input(s): "LIPASE", "AMYLASE" in the last 168 hours. No results for input(s): "AMMONIA" in the last 168 hours. Coagulation Profile: No results for input(s): "INR", "PROTIME" in the last 168 hours. Cardiac Enzymes: No results for input(s): "CKTOTAL", "CKMB", "CKMBINDEX", "TROPONINI" in the last 168 hours. BNP (last 3 results) No results for input(s): "PROBNP" in the last 8760 hours. HbA1C: No results for input(s): "HGBA1C" in the last 72 hours. CBG: Recent Labs  Lab 06/23/22 1144 06/23/22 1755 06/24/22 0007 06/24/22 0558 06/24/22 1154  GLUCAP 126* 129* 132* 136* 136*   Lipid Profile: Recent Labs    06/22/22 0545  TRIG 83   Thyroid Function Tests: No results for input(s): "TSH", "T4TOTAL", "FREET4", "T3FREE", "THYROIDAB" in the last 72 hours. Anemia Panel: No results for input(s): "VITAMINB12", "FOLATE", "FERRITIN", "TIBC", "IRON", "RETICCTPCT" in the last 72 hours. Sepsis Labs: No results for input(s): "PROCALCITON", "LATICACIDVEN" in the last 168 hours.  No results found for this or any previous visit (from the past 240 hour(s)).       Radiology Studies: No results found.      Scheduled Meds:  sodium chloride   Intravenous Once   acidophilus  1 capsule Oral Daily   aspirin  81 mg Oral Daily   Chlorhexidine Gluconate Cloth  6 each Topical Daily   diclofenac Sodium  4 g Topical QID   dronabinol  2.5 mg Oral BID  AC   enoxaparin (LOVENOX) injection  0.5 mg/kg Subcutaneous Q24H   feeding supplement (NEPRO CARB STEADY)  237 mL Oral TID WC   gabapentin  600 mg Oral TID   insulin aspart  0-9 Units Subcutaneous Q6H   levETIRAcetam  1,000 mg Oral BID   lidocaine HCl (PF)  10 mL Other Once   LORazepam  1 mg Intravenous QHS   nicotine  14 mg Transdermal Daily   pantoprazole  40 mg Oral Daily   sertraline  50 mg Oral Daily   simvastatin  10 mg Oral QHS   sodium chloride flush  10-40 mL Intracatheter Q12H   tamsulosin  0.4 mg Oral QPM   tiotropium  18 mcg Inhalation Daily   trolamine salicylate   Topical BID   Continuous Infusions:  sodium chloride 10 mL/hr at 06/24/22 0616   TPN ADULT (ION) 60 mL/hr at 06/24/22 0616   TPN ADULT (ION)       LOS: 27 days     Sidney Ace, MD [facility]   If 7PM-7AM, please contact night-coverage  06/24/2022, 1:06 PM

## 2022-06-24 NOTE — Plan of Care (Signed)
  Problem: Clinical Measurements: Goal: Will remain free from infection Outcome: Progressing   Problem: Clinical Measurements: Goal: Diagnostic test results will improve Outcome: Progressing   Problem: Activity: Goal: Risk for activity intolerance will decrease Outcome: Progressing   Problem: Nutrition: Goal: Adequate nutrition will be maintained Outcome: Progressing   

## 2022-06-25 DIAGNOSIS — K5981 Ogilvie syndrome: Secondary | ICD-10-CM | POA: Diagnosis not present

## 2022-06-25 LAB — GLUCOSE, CAPILLARY
Glucose-Capillary: 132 mg/dL — ABNORMAL HIGH (ref 70–99)
Glucose-Capillary: 133 mg/dL — ABNORMAL HIGH (ref 70–99)
Glucose-Capillary: 127 mg/dL — ABNORMAL HIGH (ref 70–99)
Glucose-Capillary: 134 mg/dL — ABNORMAL HIGH (ref 70–99)

## 2022-06-25 MED ORDER — TRACE MINERALS CU-MN-SE-ZN 300-55-60-3000 MCG/ML IV SOLN
INTRAVENOUS | Status: AC
  Filled 2022-06-25: qty 700.8

## 2022-06-25 NOTE — Progress Notes (Signed)
PROGRESS NOTE    Calvin [name]  GYI:[id number] DOB: [birth year] DOA: 05/28/2022 PCP: Pcp, No    Brief Narrative:  71 y.o. male with medical history significant of Ogilvie's syndrome, ileus, hypertension, hyperlipidemia, COPD, stroke with mild left-sided weakness, GERD, gout, depression with anxiety, seizure, CAD, MI,  alcohol abuse, CKD stage IIIa, dCHF, colon cancer, thyroid cancer, who presents from his facility with Nausea, vomiting, abdominal distention, abdominal pain for 2 days. Found to have new small bowel obstruction on CT abdomen with transition point in the right lower abdomen suspicious for adhesion. General surgery was consulted and NG tube was placed.   Patient also developed left-sided tingling and numbness and facial droop while in ED. CT head and MRI brain was negative for any acute intracranial abnormality and do reflect a chronic changes. 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  MRI of the cervical spine showed C3-C4 moderate to severe spinal stenosis, seen by neurosurgery, recommended outpatient follow-up.   9/25: Continued to have significant colonic distention.  NG suction continued. The patient was transferred to the stepdown unit for neostigmine dosing on 05/31/2022 and 06/01/2022.   Patient had a colonoscopy decompression on 9/29 and 10/3, followed with rectal tube. Tube feeding started on 10/30-10/3.   10/4.  Modified barium swallow showed a significant risk of aspiration.  TPN started after PICC line placed.   10/10.  Patient currently on TPN, GI cannot place PEG tube due to worsening due to abdominal distention.  Patient will need a total colectomy in order to survive.  Dr. Roosevelt Locks spoke with Dr. Doyne Keel from [facility] and he did not believe that he was a good surgical candidate and can touch back with them in a week if not improved.   10/12.  Patient considering major surgical intervention requiring colectomy and ileostomy and feeding tube.      10/13. Dr Dahlia Byes took to the OR for robotic assisted laparoscopic transverse loop colostomy creation.   10/16.  Surgical team cleared for starting liquids.  Need to get speech therapy for a swallow evaluation.   started on dysphagia diet with nectar thick liquids but still high risk for aspiration.   06/20/2022.  Need to see how much she is able to intake.  May end up needing a PEG. 10/18: Discussed with general surgery.  They have signed off at this time.  Will follow peripherally.  Patient remains on TPN.  Attempting to eat dysphagia 1 diet.  10/19: Patient's p.o. intake remains poor.  Unable or unwilling to engage in discussion regarding PEG tube placement.  Current plan of care is to optimize as much as possible and discharge to skilled nursing facility with outpatient palliative care following.   Assessment & Plan:   Principal Problem:   Ogilvie syndrome Active Problems:   Acute kidney injury superimposed on CKD (HCC)   Paroxysmal atrial flutter (HCC)   Chronic diastolic CHF (congestive heart failure) (HCC)   Cervical spinal stenosis   Seizures (HCC)   HTN (hypertension)   Aspiration pneumonia (HCC)   CAD (coronary artery disease)   Respiratory distress   HLD (hyperlipidemia)   COPD (chronic obstructive pulmonary disease) (HCC)   Tobacco abuse   Depression with anxiety   Acute abdominal pain   Neuropathy   Dysphagia   Headache  Ogilvie syndrome General surgery does not think this is bowel obstruction.  Gastroenterology gave neostigmine infusion on 05/31/2022 and on 06/01/22.  Decompressive colonoscopy procedure on 06/02/2022 and on 06/06/2022 and left a rectal  tube in. Patient did not do well with barium swallow exam.  Patient currently on TPN.  Dr. Dahlia Byes took to the operating room for robotic assisted transverse colon diverting colostomy on 06/16/2022.  Repeat swallow evaluation had improved on 06/19/2022.  Now on dysphagia diet with nectar thickened liquids.  Still increased  risk of aspiration.  May end up needing a PEG depending on how much he is able to intake.  RD following for calorie count.  Continue TPN.  Wean as tolerated.  We will revisit question of PEG tube placement.  Marinol added 2.5 mg p.o. twice daily on 10/20 Plan: Continue dronabinol 2.5 mg twice daily Continue TPN Encourage p.o. intake Wean TPN as tolerated   Acute kidney injury superimposed on CKD 3 ([place]) AKI on CKD stage IIIa.  Creatinine 1.07 on 10/21.  Slowly improving.  Continue TPN  Paroxysmal atrial flutter (HCC) Currently rate controlled and in normal sinus rhythm.  Had paroxysmal atrial fibrillation and atrial flutter while he was in the ICU, earlier in the hospital course.  On as needed IV metoprolol.  We will hold off on major anticoagulation until we determine if the patient needs a PEG or not.  DVT prophylaxis ordered.   Cervical spinal stenosis MRI showing multilevel cervical spondylosis most pronounced at C3-C4 where moderate to severe canal stenosis and severe bilateral foraminal stenosis.  Follow-up with Dr. Izora Ribas as outpatient   Chronic diastolic CHF (congestive heart failure) ([place]) -Last EF 50% -Hold Lasix   Seizures ([place]) All antiepileptic switch to oral  Aspiration pneumonia ([place]) Completed 5 days of Zosyn on 06/05/2022.   HTN (hypertension) Blood pressure stable off of medications.   Respiratory distress Respiratory distress on 06/01/2022 with upper airway congestion.  Improved with Decadron and racemic epinephrine infusion.  Currently not on oxygen.   CAD (coronary artery disease) No chest pain.  Troponin negative x2     HLD (hyperlipidemia) Hold Zocor   COPD (chronic obstructive pulmonary disease) (HCC) Continue nebulizers   Tobacco abuse -nicotine patch down to 14 mcg.   Depression with anxiety Can resume p.o. as needed Xanax and Zoloft   Dysphagia Did better on last swallow evaluation.  On dysphagia 1 diet   Neuropathy Restarted  gabapentin   DVT prophylaxis: SQ Lovenox Code Status: Full Family Communication: Daughter [name] [phone] on 10/20 Disposition Plan: Status is: Inpatient Remains inpatient appropriate because: Dysphagia, malnutrition on TPN   Level of care: Med-Surg  Consultants:  General surgery  Procedures:  Multiple during hospitalization  Antimicrobials: None   Subjective: Seen and examined.  Attempting to eat more.  Appetite has been poor.  Objective: Vitals:   06/24/22 2027 06/25/22 0526 06/25/22 0822 06/25/22 1204  BP: 132/73 121/67 126/73 121/72  Pulse: 91 84 78 84  Resp: '18 18 18   '$ Temp: 98.8 F (37.1 C) 98.4 F (36.9 C) (!) 97.5 F (36.4 C)   TempSrc:   Oral   SpO2: 98% 100% 96% 98%  Weight:      Height:        Intake/Output Summary (Last 24 hours) at 06/25/2022 1348 Last data filed at 06/25/2022 1024 Gross per 24 hour  Intake 1615.23 ml  Output 2450 ml  Net -834.77 ml   Filed Weights   06/22/22 0500 06/23/22 0449 06/24/22 0500  Weight: 100.5 kg 98.9 kg 99 kg    Examination:  General exam: Appears frail and chronically ill Respiratory system: Bibasilar crackles.  Normal work of breathing.  Room air Cardiovascular system: S1-S2,  RRR, no murmurs, no pedal edema Gastrointestinal system: Soft, NT/ND, normal bowel sounds, ostomy Central nervous system: Alert, oriented x2, no focal deficits Extremities: Decreased power symmetrically Skin: No rashes, lesions or ulcers Psychiatry: Judgement and insight appear impaired. Mood & affect flattened.     Data Reviewed: I have personally reviewed following labs and imaging studies  CBC: Recent Labs  Lab 06/21/22 0512  HGB 9.1*   Basic Metabolic Panel: Recent Labs  Lab 06/19/22 0540 06/20/22 0415 06/21/22 0512 06/22/22 0545 06/23/22 0447 06/24/22 0619  NA 141 141 143 141 138 137  K 4.5 4.4 4.2 4.3 4.7 4.7  CL 106 108 107 111 104 104  CO2 '30 29 28 27 28 26  '$ GLUCOSE 115* 128* 134* 121* 129*  133*  BUN 41* 47* 53* 42* 32* 32*  CREATININE 1.30* 1.48* 1.51* 1.12 1.16 1.07  CALCIUM 8.2* 8.3* 8.5* 8.4* 8.7* 8.8*  MG 2.4 2.4  --  1.9  --   --   PHOS 4.6 4.4  --  3.6  --   --    GFR: Estimated Creatinine Clearance: 73.7 mL/min (by C-G formula based on SCr of 1.07 mg/dL). Liver Function Tests: Recent Labs  Lab 06/19/22 0540 06/22/22 0545  AST 17 27  ALT 22 48*  ALKPHOS 61 73  BILITOT 0.6 0.5  PROT 6.0* 5.9*  ALBUMIN 2.7* 2.7*   No results for input(s): "LIPASE", "AMYLASE" in the last 168 hours. No results for input(s): "AMMONIA" in the last 168 hours. Coagulation Profile: No results for input(s): "INR", "PROTIME" in the last 168 hours. Cardiac Enzymes: No results for input(s): "CKTOTAL", "CKMB", "CKMBINDEX", "TROPONINI" in the last 168 hours. BNP (last 3 results) No results for input(s): "PROBNP" in the last 8760 hours. HbA1C: No results for input(s): "HGBA1C" in the last 72 hours. CBG: Recent Labs  Lab 06/24/22 1154 06/24/22 1814 06/24/22 2313 06/25/22 0524 06/25/22 1206  GLUCAP 136* 134* 134* 132* 133*   Lipid Profile: No results for input(s): "CHOL", "HDL", "LDLCALC", "TRIG", "CHOLHDL", "LDLDIRECT" in the last 72 hours.  Thyroid Function Tests: No results for input(s): "TSH", "T4TOTAL", "FREET4", "T3FREE", "THYROIDAB" in the last 72 hours. Anemia Panel: No results for input(s): "VITAMINB12", "FOLATE", "FERRITIN", "TIBC", "IRON", "RETICCTPCT" in the last 72 hours. Sepsis Labs: No results for input(s): "PROCALCITON", "LATICACIDVEN" in the last 168 hours.  No results found for this or any previous visit (from the past 240 hour(s)).       Radiology Studies: No results found.      Scheduled Meds:  sodium chloride   Intravenous Once   acidophilus  1 capsule Oral Daily   aspirin  81 mg Oral Daily   Chlorhexidine Gluconate Cloth  6 each Topical Daily   diclofenac Sodium  4 g Topical QID   dronabinol  2.5 mg Oral BID AC   enoxaparin (LOVENOX)  injection  0.5 mg/kg Subcutaneous Q24H   feeding supplement (NEPRO CARB STEADY)  237 mL Oral TID WC   gabapentin  600 mg Oral TID   insulin aspart  0-9 Units Subcutaneous Q6H   levETIRAcetam  1,000 mg Oral BID   lidocaine HCl (PF)  10 mL Other Once   LORazepam  1 mg Intravenous QHS   nicotine  14 mg Transdermal Daily   pantoprazole  40 mg Oral Daily   sertraline  50 mg Oral Daily   simvastatin  10 mg Oral QHS   sodium chloride flush  10-40 mL Intracatheter Q12H   tamsulosin  0.4 mg Oral  QPM   tiotropium  18 mcg Inhalation Daily   trolamine salicylate   Topical BID   Continuous Infusions:  sodium chloride 10 mL/hr at 06/24/22 1400   TPN ADULT (ION) 60 mL/hr at 06/24/22 1820   TPN ADULT (ION)       LOS: 28 days     Sidney Ace, MD [facility]   If 7PM-7AM, please contact night-coverage  06/25/2022, 1:48 PM

## 2022-06-25 NOTE — Plan of Care (Signed)

## 2022-06-25 NOTE — Consult Note (Signed)
PHARMACY - TOTAL PARENTERAL NUTRITION CONSULT NOTE   Indication:  Intolerance to enteral feeding  Patient Measurements: Height: '5\' 11"'$  (180.3 cm) Weight: 99 kg (218 lb 4.1 oz) IBW/kg (Calculated) : 75.3 TPN AdjBW (KG): 92.6 Body mass index is 30.44 kg/m.  Assessment:  Patient is a 71 y/o M with medical history including Ogilvie's syndrome, HTN, HLD, COPD, CVA, GERD, gout, depression / anxiety, seizures, CAD, EtOH use disorder, CKD, diastolic CHF, colon cancer, thyroid cancer who is admitted with resolved SBO but now with worsening pseudoobstruction. GI consulted. Patient failed to respond to conservative measures with methylnaltrexone and neostigmine x 2. Patient underwent decompressive colonoscopy on 7/94 complicated by colonic distension post-procedurally. Patient underwent repeat decompressive colonoscopy on 10/3. Patient had been on tube feeds at trickle rate. Pharmacy consulted to initiate TPN to help meet nutritional goals given acute abdominal issues and intolerance to enteral nutrition.   Glucose / Insulin: Pre-diabetic range Hgb A1c (5.8% on 05/28/22). Last 24 hrs 132 - 136:  4 units insulin SSI. Electrolytes: WNL Renal: Scr 0.97>>1.51-->1.07 Hepatic: ALT mildly elevated Intake / Output; MIVF: NS @ 10 ml/hr GI Imaging: 9/24 CT abdomen / pelvis: SBO and chronic colonic ileus or pseudo-obstruction 9/26 Abdominal X-ray: Air-filled distention of large and small bowel loops favoring ileus 9/27 CT abdomen / pelvis: No evidence of SBO 9/29 Abdominal X-ray: Severe dilatation of the colon as can be seen with pseudo-obstruction/colonic ileus 9/30 Abdominal X-ray: Significant interval improvement in colonic distension, which has mostly resolved 10/1 Abdominal X-ray: Persistent gaseous dilation of the colon, though mildly improved compared to 06/02/2022 10/2 Abdominal X-ray: Worsened gaseous distention of the colon 10/3 Interval placement of colonic decompression tube with decreased colonic  gaseous distention. 10/9 Gaseous distention of transverse and proximal descending colon with interval worsening. 10/12: abdominal x-ray: Slight improvement in moderate distention of the ascending and transverse colon. No evidence of bowel obstruction. 10/16: abdominal x-ray: Nonobstructive bowel gas pattern. Persistent retained oral contrast throughout the colon and rectum. Moderate gaseous distention of the colon is unchanged. GI Surgeries / Procedures:  9/29: Decompressive colonoscopy 10/3: Decompressive colonoscopy 10/13: Robotic assisted laparoscopic loop transverse colostomy  Central access: DL PICC placed 10/4 TPN start date: 10/4  Nutritional Goals: Goal TPN rate is 60 mL/hr (provides101 g of protein and 1938 kcals per day)  RD Assessment: Estimated Needs Total Energy Estimated Needs: 1900-2200kcal/day Total Protein Estimated Needs: 95-110g/day Total Fluid Estimated Needs: 1.9-2.2L/day  Current Nutrition:  Dysphagia level 1 diet   Plan:  --Continue TPN at 60 mL/hr (goal rate) TPN will provide: Protein: 105 g (70 g/L of Clinisol 15%) Dextrose: 346 g (24%) Lipids: 36 g (36 g/L) Kcals / day: 1955 Fluid volume: 1540 mL --Electrolytes in TPN: Na 29mq/L, Ca 525m/L, K 10 mEq/L, Phos 9 mmol/L and Mg 3 mEq/L. Cl:Ac 1:2  --Add standard MVI and trace elements to TPN  --SSI(sensitive) q6h --Monitor TPN labs on Mon/Thurs   Britani Beattie A Kesha Hurrell 06/25/2022,8:37 AM

## 2022-06-26 DIAGNOSIS — K5981 Ogilvie syndrome: Secondary | ICD-10-CM | POA: Diagnosis not present

## 2022-06-26 LAB — COMPREHENSIVE METABOLIC PANEL
ALT: 146 U/L — ABNORMAL HIGH (ref 0–44)
AST: 68 U/L — ABNORMAL HIGH (ref 15–41)
Albumin: 2.9 g/dL — ABNORMAL LOW (ref 3.5–5.0)
Alkaline Phosphatase: 119 U/L (ref 38–126)
Anion gap: 7 (ref 5–15)
BUN: 40 mg/dL — ABNORMAL HIGH (ref 8–23)
CO2: 25 mmol/L (ref 22–32)
Calcium: 9 mg/dL (ref 8.9–10.3)
Chloride: 105 mmol/L (ref 98–111)
Creatinine, Ser: 1.28 mg/dL — ABNORMAL HIGH (ref 0.61–1.24)
GFR, Estimated: 59 mL/min — ABNORMAL LOW (ref >60)
Glucose, Bld: 127 mg/dL — ABNORMAL HIGH (ref 70–99)
Potassium: 4.6 mmol/L (ref 3.5–5.1)
Sodium: 137 mmol/L (ref 135–145)
Total Bilirubin: 0.4 mg/dL (ref 0.3–1.2)
Total Protein: 6.6 g/dL (ref 6.5–8.1)

## 2022-06-26 LAB — PHOSPHORUS: Phosphorus: 4.6 mg/dL (ref 2.5–4.6)

## 2022-06-26 LAB — TRIGLYCERIDES: Triglycerides: 70 mg/dL (ref <150)

## 2022-06-26 LAB — GLUCOSE, CAPILLARY
Glucose-Capillary: 131 mg/dL — ABNORMAL HIGH (ref 70–99)
Glucose-Capillary: 133 mg/dL — ABNORMAL HIGH (ref 70–99)
Glucose-Capillary: 138 mg/dL — ABNORMAL HIGH (ref 70–99)
Glucose-Capillary: 139 mg/dL — ABNORMAL HIGH (ref 70–99)

## 2022-06-26 LAB — MAGNESIUM: Magnesium: 1.7 mg/dL (ref 1.7–2.4)

## 2022-06-26 MED ORDER — TRACE MINERALS CU-MN-SE-ZN 300-55-60-3000 MCG/ML IV SOLN
INTRAVENOUS | Status: AC
  Filled 2022-06-26: qty 700.8

## 2022-06-26 MED ORDER — DRONABINOL 2.5 MG PO CAPS
5.0000 mg | ORAL_CAPSULE | Freq: Two times a day (BID) | ORAL | Status: DC
  Administered 2022-06-26 – 2022-06-30 (×8): 5 mg via ORAL
  Filled 2022-06-26 (×8): qty 2

## 2022-06-26 NOTE — Consult Note (Signed)
PHARMACY - TOTAL PARENTERAL NUTRITION CONSULT NOTE   Indication:  Intolerance to enteral feeding  Patient Measurements: Height: '5\' 11"'$  (180.3 cm) Weight: 99 kg (218 lb 4.1 oz) IBW/kg (Calculated) : 75.3 TPN AdjBW (KG): 92.6 Body mass index is 30.44 kg/m.  Assessment:  Patient is a 71 y/o M with medical history including Ogilvie's syndrome, HTN, HLD, COPD, CVA, GERD, gout, depression / anxiety, seizures, CAD, EtOH use disorder, CKD, diastolic CHF, colon cancer, thyroid cancer who is admitted with resolved SBO but now with worsening pseudoobstruction. GI consulted. Patient failed to respond to conservative measures with methylnaltrexone and neostigmine x 2. Patient underwent decompressive colonoscopy on 1/63 complicated by colonic distension post-procedurally. Patient underwent repeat decompressive colonoscopy on 10/3. Patient had been on tube feeds at trickle rate. Pharmacy consulted to initiate TPN to help meet nutritional goals given acute abdominal issues and intolerance to enteral nutrition.   Glucose / Insulin: Pre-diabetic range Hgb A1c (5.8% on 05/28/22).  Last 24 hrs 127-133:  5 units insulin SSI. Electrolytes: WNL Renal: Scr 0.97>1.07>1.28 Hepatic: ALT 22>48>146 (3x ULN); Triglycerides 83>70  Intake / Output; MIVF: NS @ 10 ml/hr GI Imaging: 9/24 CT abdomen / pelvis: SBO and chronic colonic ileus or pseudo-obstruction 9/26 Abdominal X-ray: Air-filled distention of large and small bowel loops favoring ileus 9/27 CT abdomen / pelvis: No evidence of SBO 9/29 Abdominal X-ray: Severe dilatation of the colon as can be seen with pseudo-obstruction/colonic ileus 9/30 Abdominal X-ray: Significant interval improvement in colonic distension, which has mostly resolved 10/1 Abdominal X-ray: Persistent gaseous dilation of the colon, though mildly improved compared to 06/02/2022 10/2 Abdominal X-ray: Worsened gaseous distention of the colon 10/3 Interval placement of colonic decompression tube  with decreased colonic gaseous distention. 10/9 Gaseous distention of transverse and proximal descending colon with interval worsening. 10/12: abdominal x-ray: Slight improvement in moderate distention of the ascending and transverse colon. No evidence of bowel obstruction. 10/16: abdominal x-ray: Nonobstructive bowel gas pattern. Persistent retained oral contrast throughout the colon and rectum. Moderate gaseous distention of the colon is unchanged. GI Surgeries / Procedures:  9/29: Decompressive colonoscopy 10/3: Decompressive colonoscopy 10/13: Robotic assisted laparoscopic loop transverse colostomy  Central access: DL PICC placed 10/4 TPN start date: 10/4  Nutritional Goals: Goal TPN rate is 60 mL/hr (provides101 g of protein and 1938 kcals per day)  RD Assessment: Estimated Needs Total Energy Estimated Needs: 1900-2200kcal/day Total Protein Estimated Needs: 95-110g/day Total Fluid Estimated Needs: 1.9-2.2L/day  Current Nutrition:  Dysphagia level 1 diet   Plan:  --Continue TPN at 60 mL/hr (goal rate) TPN will provide: Protein: 105 g (70 g/L of Clinisol 15%) Dextrose: 346 g (24%) Lipids: 36 g (36 g/L) Kcals / day: 1955 Fluid volume: 1540 mL --Electrolytes in TPN: Na 8mq/L, Ca 555m/L, K 10 mEq/L, Phos 9 mmol/L and Mg 3 mEq/L. Cl:Ac 1:2  --Add standard MVI and trace elements to TPN  --SSI(sensitive) q6h -- Also receiving dronabinol 2.5 BID, feeding supplement TID, NS KVO --Monitor TPN labs on Mon/Thurs   BrShanon Broweers 06/26/2022,10:01 AM

## 2022-06-26 NOTE — Progress Notes (Addendum)
Mobility Specialist - Progress Note   06/26/22 1000  Mobility  Activity Stood at bedside;Ambulated with assistance in room  Level of Assistance Moderate assist, patient does 50-74%  Assistive Device Front wheel walker  Distance Ambulated (ft) 2 ft  Range of Motion/Exercises Left leg;Right leg (Heel slides to chest, knee press downs, lateral raises)  Activity Response Tolerated well  $Mobility charge 1 Mobility   Pt was alert entering the room and agreeable to exercise. Max assist for bed mobility. STS with mod assist +2 with VC for anterior weight shifting. Pt unable to maintain safe upright positioning without persistent assist. Stood to edge of bed and expressed dizziness and fatigue while standing limiting further distance. Pt returned to edge of bed for attempt to laterally scoot toward [facility] but unsuccessful in attempt as pt begins to complain of nausea. Pt requested to return supine for completion of stationary exercises. Pt left with alarm set and needs in reach.   Loma Sender Mobility Specialist 06/26/22, 10:42 AM

## 2022-06-26 NOTE — TOC Progression Note (Signed)
Transition of Care [facility]) - Progression Note    Patient Details  Name: Calvin [name] MRN: [id number] Date of Birth: 05-08-1951  [facility] CM/SW Contact  Beverly Sessions, RN Phone Number: 06/26/2022, 2:50 PM  Clinical Narrative:     Appears patient does have revenue codes for LTACH.  Discussed case with LTACH rep and patient does not medically qualify for LTACH level of care.  MD notified  Plan to increase oral intake and wean off TPN.  MD notified that patient would be unable to go to SNF level of care with TPN   Expected Discharge Plan: [place] Barriers to Discharge: Continued Medical Work up  Expected Discharge Plan and Services Expected Discharge Plan: [place]   Discharge Planning Services: CM Consult Post Acute Care Choice: Resumption of Svcs/PTA Provider, [place] Living arrangements for the past 2 months: [place]                 DME Arranged: N/A DME Agency: NA       HH Arranged: NA HH Agency: NA         Social Determinants of Health (SDOH) Interventions    Readmission Risk Interventions    06/02/2022   12:02 PM 10/17/2021   10:47 AM  Readmission Risk Prevention Plan  Transportation Screening Complete Complete  PCP or Specialist Appt within 3-5 Days  Complete  Social Work Consult for [place] Planning/Counseling  Complete  Palliative Care Screening  Not Applicable  Medication Review [employer] photographer) Complete Complete  PCP or Specialist appointment within 3-5 days of discharge Complete   HRI or Home Care Consult Complete   SW Recovery Care/Counseling Consult Complete   Palliative Care Screening Complete   Skilled Nursing Facility Complete

## 2022-06-26 NOTE — Progress Notes (Signed)
Physical Therapy Treatment Patient Details Name: Calvin [name] MRN: [id number] DOB: 1951-05-12 Today's Date: 06/26/2022   History of Present Illness Patient is a 71 year old male with medical history significant of Ogilvie's syndrome, ileus, hypertension, hyperlipidemia, COPD, stroke with mild left-sided weakness, GERD, gout, depression with anxiety, seizure, CAD, MI,  alcohol abuse, CKD stage IIIa, dCHF, colon cancer, thyroid cancer, who presents from his facility with Nausea, vomiting, abdominal distention, abdominal pain for 2 days. Found to have new small bowel obstruction. He developed L side tingling and numbness and facial droop while in the ED, head CT and MRI of brain was negative for acute abnormalitites. s/p laparoscopic transverse loop colostomy creation 10/13    PT Comments    Patient declined any OOB mobility as he reports already getting up once today. With encouragement, patient is able to participate with LE exercises for strengthening to promote increased independence with functional mobility tasks. See exercises below. Recommend to continue PT to maximize independence and facilitate return to prior level of function. SNF recommended at discharge.    Recommendations for follow up therapy are one component of a multi-disciplinary discharge planning process, led by the attending physician.  Recommendations may be updated based on patient status, additional functional criteria and insurance authorization.  Follow Up Recommendations  Skilled nursing-short term rehab (<3 hours/day) Can patient physically be transported by private vehicle: No   Assistance Recommended at Discharge Frequent or constant Supervision/Assistance  Patient can return home with the following Assistance with cooking/housework;[name];Assist for transportation;Direct supervision/assist for medications management;Direct supervision/assist for financial management;Two people to help  with walking and/or transfers;Two people to help with bathing/dressing/bathroom   Equipment Recommendations  None recommended by PT    Recommendations for Other Services       Precautions / Restrictions Precautions Precautions: Fall Restrictions Weight Bearing Restrictions: No     Mobility  Bed Mobility               General bed mobility comments: patient refused mobility but was agreeable to LE exercises in bed to assist with independence with functional activity    Transfers                        Ambulation/Gait                   Stairs             Wheelchair Mobility    Modified Rankin (Stroke Patients Only)       Balance                                            Cognition Arousal/Alertness: Awake/alert Behavior During Therapy: Flat affect Overall Cognitive Status: No family/caregiver present to determine baseline cognitive functioning                                 General Comments: patient needs encouragement to participate. he is able to follow single step commands with increased time        Exercises General Exercises - Lower Extremity Ankle Circles/Pumps: AAROM, Strengthening, Both, 10 reps, Seated Heel Slides: AAROM, Strengthening, Both, 10 reps, Supine Hip ABduction/ADduction: AAROM, Strengthening, Both, 10 reps, Supine Straight Leg Raises: AAROM, Strengthening, Both, 10 reps, Supine Other  Exercises Other Exercises: verbal and visual cues for exercise technique for strengthening. no reported increased pain with LE exercise    General Comments        Pertinent Vitals/Pain Pain Assessment Pain Assessment: Faces Faces Pain Scale: Hurts little more Pain Location: L side of stomach Pain Descriptors / Indicators: Discomfort Pain Intervention(s): Limited activity within patient's tolerance, Monitored during session    Home Living                          Prior  Function            PT Goals (current goals can now be found in the care plan section) Acute Rehab PT Goals Patient Stated Goal: to have better bowel function PT Goal Formulation: With patient Time For Goal Achievement: 07/03/22 Potential to Achieve Goals: Fair Progress towards PT goals: Progressing toward goals    Frequency    Min 2X/week      PT Plan Current plan remains appropriate    Co-evaluation              AM-PAC PT "6 Clicks" Mobility   Outcome Measure  Help needed turning from your back to your side while in a flat bed without using bedrails?: A Lot Help needed moving from lying on your back to sitting on the side of a flat bed without using bedrails?: A Lot Help needed moving to and from a bed to a chair (including a wheelchair)?: A Lot Help needed standing up from a chair using your arms (e.g., wheelchair or bedside chair)?: Total Help needed to walk in hospital room?: Total Help needed climbing 3-5 steps with a railing? : Total 6 Click Score: 9    End of Session   Activity Tolerance: Patient limited by fatigue Patient left: in bed;with call bell/phone within reach;with bed alarm set   PT Visit Diagnosis: Muscle weakness (generalized) (M62.81);Difficulty in walking, not elsewhere classified (R26.2)     Time: 3825-0539 PT Time Calculation (min) (ACUTE ONLY): 10 min  Charges:  $Therapeutic Exercise: 8-22 mins                     Calvin [name], PT, MPT   Calvin [name] 06/26/2022, 1:41 PM

## 2022-06-26 NOTE — Plan of Care (Signed)
?  Problem: Coping: ?Goal: Level of anxiety will decrease ?Outcome: Progressing ?  ?Problem: Safety: ?Goal: Ability to remain free from injury will improve ?Outcome: Progressing ?  ?

## 2022-06-26 NOTE — Progress Notes (Signed)
PROGRESS NOTE    Calvin [name]  WUJ:[id number] DOB: 02-18-1951 DOA: 05/28/2022 PCP: Pcp, No    Brief Narrative:  71 y.o. male with medical history significant of Ogilvie's syndrome, ileus, hypertension, hyperlipidemia, COPD, stroke with mild left-sided weakness, GERD, gout, depression with anxiety, seizure, CAD, MI,  alcohol abuse, CKD stage IIIa, dCHF, colon cancer, thyroid cancer, who presents from his facility with Nausea, vomiting, abdominal distention, abdominal pain for 2 days. Found to have new small bowel obstruction on CT abdomen with transition point in the right lower abdomen suspicious for adhesion. General surgery was consulted and NG tube was placed.   Patient also developed left-sided tingling and numbness and facial droop while in ED. CT head and MRI brain was negative for any acute intracranial abnormality and do reflect a chronic changes. 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  MRI of the cervical spine showed C3-C4 moderate to severe spinal stenosis, seen by neurosurgery, recommended outpatient follow-up.   9/25: Continued to have significant colonic distention.  NG suction continued. The patient was transferred to the stepdown unit for neostigmine dosing on 05/31/2022 and 06/01/2022.   Patient had a colonoscopy decompression on 9/29 and 10/3, followed with rectal tube. Tube feeding started on 10/30-10/3.   10/4.  Modified barium swallow showed a significant risk of aspiration.  TPN started after PICC line placed.   10/10.  Patient currently on TPN, GI cannot place PEG tube due to worsening due to abdominal distention.  Patient will need a total colectomy in order to survive.  Dr. Roosevelt Locks spoke with Dr. Doyne Keel from [facility] and he did not believe that he was a good surgical candidate and can touch back with them in a week if not improved.   10/12.  Patient considering major surgical intervention requiring colectomy and ileostomy and feeding tube.      10/13. Dr Dahlia Byes took to the OR for robotic assisted laparoscopic transverse loop colostomy creation.   10/16.  Surgical team cleared for starting liquids.  Need to get speech therapy for a swallow evaluation.   started on dysphagia diet with nectar thick liquids but still high risk for aspiration.   06/20/2022.  Need to see how much she is able to intake.  May end up needing a PEG. 10/18: Discussed with general surgery.  They have signed off at this time.  Will follow peripherally.  Patient remains on TPN.  Attempting to eat dysphagia 1 diet.  10/19: Patient's p.o. intake remains poor.  Unable or unwilling to engage in discussion regarding PEG tube placement.  Current plan of care is to optimize as much as possible and discharge to skilled nursing facility with outpatient palliative care following.     Assessment & Plan:   Principal Problem:   Ogilvie syndrome Active Problems:   Acute kidney injury superimposed on CKD (HCC)   Paroxysmal atrial flutter (HCC)   Chronic diastolic CHF (congestive heart failure) (HCC)   Cervical spinal stenosis   Seizures (HCC)   HTN (hypertension)   Aspiration pneumonia (HCC)   CAD (coronary artery disease)   Respiratory distress   HLD (hyperlipidemia)   COPD (chronic obstructive pulmonary disease) (HCC)   Tobacco abuse   Depression with anxiety   Acute abdominal pain   Neuropathy   Dysphagia   Headache  Ogilvie syndrome General surgery does not think this is bowel obstruction.  Gastroenterology gave neostigmine infusion on 05/31/2022 and on 06/01/22.  Decompressive colonoscopy procedure on 06/02/2022 and on 06/06/2022 and left  a rectal tube in. Patient did not do well with barium swallow exam.  Patient currently on TPN.  Dr. Dahlia Byes took to the operating room for robotic assisted transverse colon diverting colostomy on 06/16/2022.  Repeat swallow evaluation had improved on 06/19/2022.  Now on dysphagia diet with nectar thickened liquids.  Still increased  risk of aspiration.  May end up needing a PEG depending on how much he is able to intake.  RD following for calorie count.  Continue TPN.  Wean as tolerated.  We will revisit question of PEG tube placement.  Marinol added 2.5 mg p.o. twice daily on 10/20 -PEG is likely not a appropriate solution.  This will not decrease risk of aspiration Plan: Dronabinol, dose increased to 5 mg twice daily Continue TPN Encourage p.o. intake Wean TPN as tolerated   Acute kidney injury superimposed on CKD 3 ([place]) AKI on CKD stage IIIa.  Creatinine 1.07 on 10/21.  Slowly improving.  Continue TPN  Paroxysmal atrial flutter (HCC) Currently rate controlled and in normal sinus rhythm.  Had paroxysmal atrial fibrillation and atrial flutter while he was in the ICU, earlier in the hospital course.  On as needed IV metoprolol.  We will hold off on major anticoagulation until we determine if the patient needs a PEG or not.  DVT prophylaxis ordered.  Do not recommend PEG tube at this time   Cervical spinal stenosis MRI showing multilevel cervical spondylosis most pronounced at C3-C4 where moderate to severe canal stenosis and severe bilateral foraminal stenosis.  Follow-up with Dr. Izora Ribas as outpatient   Chronic diastolic CHF (congestive heart failure) ([place]) -Last EF 50% -Hold Lasix   Seizures ([place]) All antiepileptic switch to oral  Aspiration pneumonia ([place]) Completed 5 days of Zosyn on 06/05/2022.   HTN (hypertension) Blood pressure stable off of medications.   Respiratory distress Respiratory distress on 06/01/2022 with upper airway congestion.  Improved with Decadron and racemic epinephrine infusion.  Currently not on oxygen.   CAD (coronary artery disease) No chest pain.  Troponin negative x2     HLD (hyperlipidemia) Hold Zocor   COPD (chronic obstructive pulmonary disease) (HCC) Continue nebulizers   Tobacco abuse -nicotine patch down to 14 mcg.   Depression with anxiety Can resume p.o. as  needed Xanax and Zoloft   Dysphagia Did better on last swallow evaluation.  On dysphagia 1 diet   Neuropathy Restarted gabapentin   DVT prophylaxis: SQ Lovenox Code Status: Full Family Communication: Daughter [name] [phone] on 10/20 Disposition Plan: Status is: Inpatient Remains inpatient appropriate because: Dysphagia, malnutrition on TPN   Level of care: Med-Surg  Consultants:  General surgery  Procedures:  Multiple during hospitalization  Antimicrobials: None   Subjective: Seen and examined.  Attempting to eat more.  Appetite has been poor.  Objective: Vitals:   06/25/22 1938 06/25/22 2031 06/26/22 0411 06/26/22 0851  BP: 113/70 121/67 120/74 122/64  Pulse: 96 95 83 83  Resp: '18 18 20 18  '$ Temp: 98.5 F (36.9 C) 98.3 F (36.8 C) 97.8 F (36.6 C) 98.5 F (36.9 C)  TempSrc: Oral Oral Oral   SpO2: 100% 97% 95% 98%  Weight:      Height:        Intake/Output Summary (Last 24 hours) at 06/26/2022 1323 Last data filed at 06/26/2022 1254 Gross per 24 hour  Intake 1069.11 ml  Output 700 ml  Net 369.11 ml   Filed Weights   06/22/22 0500 06/23/22 0449 06/24/22 0500  Weight: 100.5 kg 98.9  kg 99 kg    Examination:  General exam: NAD.  Communicating clearly.  Attempting to eat more. Respiratory system: Bibasilar crackles.  Normal work of breathing.  Room air Cardiovascular system: S1-S2, RRR, no murmurs, no pedal edema Gastrointestinal system: Soft, NT/ND, normal bowel sounds, ostomy without stool Central nervous system: Alert, oriented x2, no focal deficits Extremities: Decreased power symmetrically Skin: No rashes, lesions or ulcers Psychiatry: Judgement and insight appear impaired. Mood & affect flattened.     Data Reviewed: I have personally reviewed following labs and imaging studies  CBC: Recent Labs  Lab 06/21/22 0512  HGB 9.1*   Basic Metabolic Panel: Recent Labs  Lab 06/20/22 0415 06/21/22 0512 06/22/22 0545  06/23/22 0447 06/24/22 0619 06/26/22 0520  NA 141 143 141 138 137 137  K 4.4 4.2 4.3 4.7 4.7 4.6  CL 108 107 111 104 104 105  CO2 '29 28 27 28 26 25  '$ GLUCOSE 128* 134* 121* 129* 133* 127*  BUN 47* 53* 42* 32* 32* 40*  CREATININE 1.48* 1.51* 1.12 1.16 1.07 1.28*  CALCIUM 8.3* 8.5* 8.4* 8.7* 8.8* 9.0  MG 2.4  --  1.9  --   --  1.7  PHOS 4.4  --  3.6  --   --  4.6   GFR: Estimated Creatinine Clearance: 61.6 mL/min (A) (by C-G formula based on SCr of 1.28 mg/dL (H)). Liver Function Tests: Recent Labs  Lab 06/22/22 0545 06/26/22 0520  AST 27 68*  ALT 48* 146*  ALKPHOS 73 119  BILITOT 0.5 0.4  PROT 5.9* 6.6  ALBUMIN 2.7* 2.9*   No results for input(s): "LIPASE", "AMYLASE" in the last 168 hours. No results for input(s): "AMMONIA" in the last 168 hours. Coagulation Profile: No results for input(s): "INR", "PROTIME" in the last 168 hours. Cardiac Enzymes: No results for input(s): "CKTOTAL", "CKMB", "CKMBINDEX", "TROPONINI" in the last 168 hours. BNP (last 3 results) No results for input(s): "PROBNP" in the last 8760 hours. HbA1C: No results for input(s): "HGBA1C" in the last 72 hours. CBG: Recent Labs  Lab 06/25/22 1728 06/25/22 2306 06/26/22 0054 06/26/22 0508 06/26/22 1211  GLUCAP 127* 134* 133* 131* 139*   Lipid Profile: Recent Labs    06/26/22 0520  TRIG 70    Thyroid Function Tests: No results for input(s): "TSH", "T4TOTAL", "FREET4", "T3FREE", "THYROIDAB" in the last 72 hours. Anemia Panel: No results for input(s): "VITAMINB12", "FOLATE", "FERRITIN", "TIBC", "IRON", "RETICCTPCT" in the last 72 hours. Sepsis Labs: No results for input(s): "PROCALCITON", "LATICACIDVEN" in the last 168 hours.  No results found for this or any previous visit (from the past 240 hour(s)).       Radiology Studies: No results found.      Scheduled Meds:  sodium chloride   Intravenous Once   acidophilus  1 capsule Oral Daily   aspirin  81 mg Oral Daily    Chlorhexidine Gluconate Cloth  6 each Topical Daily   diclofenac Sodium  4 g Topical QID   dronabinol  2.5 mg Oral BID AC   enoxaparin (LOVENOX) injection  0.5 mg/kg Subcutaneous Q24H   feeding supplement (NEPRO CARB STEADY)  237 mL Oral TID WC   gabapentin  600 mg Oral TID   insulin aspart  0-9 Units Subcutaneous Q6H   levETIRAcetam  1,000 mg Oral BID   lidocaine HCl (PF)  10 mL Other Once   LORazepam  1 mg Intravenous QHS   nicotine  14 mg Transdermal Daily   pantoprazole  40 mg  Oral Daily   sertraline  50 mg Oral Daily   simvastatin  10 mg Oral QHS   sodium chloride flush  10-40 mL Intracatheter Q12H   tamsulosin  0.4 mg Oral QPM   tiotropium  18 mcg Inhalation Daily   trolamine salicylate   Topical BID   Continuous Infusions:  sodium chloride 10 mL/hr at 06/24/22 1400   TPN ADULT (ION) 60 mL/hr at 06/25/22 1719   TPN ADULT (ION)       LOS: 29 days     Sidney Ace, MD [facility]   If 7PM-7AM, please contact night-coverage  06/26/2022, 1:23 PM

## 2022-06-26 NOTE — Progress Notes (Signed)
OT Cancellation Note  Patient Details Name: Calvin [name] MRN: [id number] DOB: 1951-08-12   Cancelled Treatment:    Reason Eval/Treat Not Completed: Fatigue/lethargy limiting ability to participate Attempted to engage patient in OT treatment, but patient reported just finishing walking and completing LE exercises.  He requested to continue resting and denied any current self-care needs.  Will continue to follow up.  Jeneen Montgomery, OTR/L 06/26/22, 11:09 AM

## 2022-06-27 LAB — GLUCOSE, CAPILLARY
Glucose-Capillary: 116 mg/dL — ABNORMAL HIGH (ref 70–99)
Glucose-Capillary: 139 mg/dL — ABNORMAL HIGH (ref 70–99)
Glucose-Capillary: 141 mg/dL — ABNORMAL HIGH (ref 70–99)
Glucose-Capillary: 147 mg/dL — ABNORMAL HIGH (ref 70–99)

## 2022-06-27 LAB — CBC
HCT: 32.4 % — ABNORMAL LOW (ref 39.0–52.0)
Hemoglobin: 10 g/dL — ABNORMAL LOW (ref 13.0–17.0)
MCH: 30.2 pg (ref 26.0–34.0)
MCHC: 30.9 g/dL (ref 30.0–36.0)
MCV: 97.9 fL (ref 80.0–100.0)
Platelets: 214 10*3/uL (ref 150–400)
RBC: 3.31 MIL/uL — ABNORMAL LOW (ref 4.22–5.81)
RDW: 12.6 % (ref 11.5–15.5)
WBC: 8.5 10*3/uL (ref 4.0–10.5)
nRBC: 0.6 % — ABNORMAL HIGH (ref 0.0–0.2)

## 2022-06-27 MED ORDER — TRACE MINERALS CU-MN-SE-ZN 300-55-60-3000 MCG/ML IV SOLN: INTRAVENOUS | Status: DC

## 2022-06-27 MED ORDER — TRACE MINERALS CU-MN-SE-ZN 300-55-60-3000 MCG/ML IV SOLN
INTRAVENOUS | Status: AC
  Filled 2022-06-27: qty 408.8

## 2022-06-27 NOTE — Progress Notes (Signed)
Occupational Therapy Treatment Patient Details Name: Calvin [name] MRN: [id number] DOB: Aug 30, 1951 Today's Date: 06/27/2022   History of present illness Patient is a 71 year old male with medical history significant of Ogilvie's syndrome, ileus, hypertension, hyperlipidemia, COPD, stroke with mild left-sided weakness, GERD, gout, depression with anxiety, seizure, CAD, MI,  alcohol abuse, CKD stage IIIa, dCHF, colon cancer, thyroid cancer, who presents from his facility with Nausea, vomiting, abdominal distention, abdominal pain for 2 days. Found to have new small bowel obstruction. He developed L side tingling and numbness and facial droop while in the ED, head CT and MRI of brain was negative for acute abnormalitites. s/p laparoscopic transverse loop colostomy creation 10/13   OT comments  Pt received semi-reclined in bed. Appearing alert; willing to work with OT on sitting balance and exercises at EOB with maximal encouragement. Able to sit EOB for approx 20 minutes and complete UE and LE exercises. See flowsheet below for further details of session. Left semi-reclined in bed with all needs in reach.     Recommendations for follow up therapy are one component of a multi-disciplinary discharge planning process, led by the attending physician.  Recommendations may be updated based on patient status, additional functional criteria and insurance authorization.    Follow Up Recommendations  Skilled nursing-short term rehab (<3 hours/day)    Assistance Recommended at Discharge Frequent or constant Supervision/Assistance  Patient can return home with the following  Two people to help with walking and/or transfers;A lot of help with bathing/dressing/bathroom;Assistance with cooking/housework;[name];Assist for transportation;Direct supervision/assist for medications management   Equipment Recommendations  Other (comment) (defer to next venue of care)     Recommendations for Other Services      Precautions / Restrictions Precautions Precautions: Fall Precaution Comments: Seizure precautions Restrictions Weight Bearing Restrictions: No       Mobility Bed Mobility Overal bed mobility: Needs Assistance Bed Mobility: Supine to Sit, Sit to Supine     Supine to sit: HOB elevated, Min assist (Pt using rails well. Extra time.) Sit to supine: Max assist        Transfers Overall transfer level:  (Pt only scooted at EOB approx 2 feet today; CGA for safety.) Equipment used: None                     Balance Overall balance assessment:  (Sitting balance intermittent cues; leaning to the R. Sat EOB for approx 20 minutes for exercises and sitting tolerance.)                                         ADL either performed or assessed with clinical judgement   ADL                                         General ADL Comments: Declines ADLs today; states he has already completed them.    Extremity/Trunk Assessment Upper Extremity Assessment Upper Extremity Assessment: Generalized weakness LUE Deficits / Details: hx stroke with weakness            Vision       Perception     Praxis      Cognition Arousal/Alertness: Awake/alert Behavior During Therapy: Flat affect Overall Cognitive Status: No family/caregiver present to determine baseline  cognitive functioning (Pt is intermittent throughout session; at times making jokes and pleasant and at other times frustrated with OT for not doing what he wanted, even though he never previously stated his expectation.) Area of Impairment: Safety/judgement, Awareness, Problem solving                         Safety/Judgement: Decreased awareness of deficits              Exercises      Shoulder Instructions       General Comments      Pertinent Vitals/ Pain       Pain Assessment Pain Assessment: 0-10 Pain Score:  (unrated;  moderate) Pain Location: L abdomen and back Pain Descriptors / Indicators: Discomfort Pain Intervention(s): Limited activity within patient's tolerance, Repositioned  Home Living                                          Prior Functioning/Environment              Frequency  Min 2X/week        Progress Toward Goals  OT Goals(current goals can now be found in the care plan section)  Progress towards OT goals: Progressing toward goals  Acute Rehab OT Goals Patient Stated Goal: to to [facility]. OT Goal Formulation: With patient Time For Goal Achievement: 07/03/22 Potential to Achieve Goals: Fair ADL Goals Pt Will Perform Grooming: standing;with min guard assist Pt Will Perform Lower Body Bathing: with min assist Pt Will Perform Lower Body Dressing: with min assist;sitting/lateral leans Pt Will Transfer to Toilet: with min assist;stand pivot transfer;bedside commode Pt Will Perform Toileting - Clothing Manipulation and hygiene: with min guard assist;sitting/lateral leans  Plan Discharge plan remains appropriate;Frequency remains appropriate    Co-evaluation                 AM-PAC OT "6 Clicks" Daily Activity     Outcome Measure   Help from another person eating meals?: A Little Help from another person taking care of personal grooming?: A Little Help from another person toileting, which includes using toliet, bedpan, or urinal?: Total Help from another person bathing (including washing, rinsing, drying)?: A Lot Help from another person to put on and taking off regular upper body clothing?: A Little Help from another person to put on and taking off regular lower body clothing?: Total 6 Click Score: 13    End of Session    OT Visit Diagnosis: Muscle weakness (generalized) (M62.81);Unsteadiness on feet (R26.81)   Activity Tolerance Patient tolerated treatment well   Patient Left in bed;with call bell/phone within reach;with bed alarm set    Nurse Communication Mobility status        Time: 1310-1345 OT Time Calculation (min): 35 min  Charges: OT General Charges $OT Visit: 1 Visit OT Treatments $Therapeutic Exercise: 23-37 mins  Waymon Amato, MS, OTR/L   Vania Rea 06/27/2022, 1:59 PM

## 2022-06-27 NOTE — Progress Notes (Signed)
Physical Therapy Treatment Patient Details Name: Calvin [name] MRN: [id number] DOB: 1951-01-09 Today's Date: 06/27/2022   History of Present Illness Patient is a 71 year old male with medical history significant of Ogilvie's syndrome, ileus, hypertension, hyperlipidemia, COPD, stroke with mild left-sided weakness, GERD, gout, depression with anxiety, seizure, CAD, MI,  alcohol abuse, CKD stage IIIa, dCHF, colon cancer, thyroid cancer, who presents from his facility with Nausea, vomiting, abdominal distention, abdominal pain for 2 days. Found to have new small bowel obstruction. He developed L side tingling and numbness and facial droop while in the ED, head CT and MRI of brain was negative for acute abnormalitites. s/p laparoscopic transverse loop colostomy creation 10/13    PT Comments    Patient is agreeable to PT session with encouragement. He had increased independence with bed mobility this session. Sitting balance is fair with preferred right lean on to elbow due to fatigue with activity.  He participated in seated LE exercises for strengthening but declined standing this date. Recommend to continue PT to maximize independence and decrease caregiver burden. SNF recommended at discharge.    Recommendations for follow up therapy are one component of a multi-disciplinary discharge planning process, led by the attending physician.  Recommendations may be updated based on patient status, additional functional criteria and insurance authorization.  Follow Up Recommendations  Skilled nursing-short term rehab (<3 hours/day) Can patient physically be transported by private vehicle: No   Assistance Recommended at Discharge Frequent or constant Supervision/Assistance  Patient can return home with the following Assistance with cooking/housework;[name];Assist for transportation;Direct supervision/assist for medications management;Direct supervision/assist for financial  management;Two people to help with walking and/or transfers;Two people to help with bathing/dressing/bathroom   Equipment Recommendations  None recommended by PT    Recommendations for Other Services       Precautions / Restrictions Precautions Precautions: Fall Restrictions Weight Bearing Restrictions: No     Mobility  Bed Mobility Overal bed mobility: Needs Assistance Bed Mobility: Supine to Sit, Sit to Supine     Supine to sit: HOB elevated, Mod assist Sit to supine: Mod assist, HOB elevated   General bed mobility comments: assistance for trunk support. verbal cues for technique. increased independence with bed mobility this session.    Transfers                   General transfer comment: patient declined standing.    Ambulation/Gait                   Stairs             Wheelchair Mobility    Modified Rankin (Stroke Patients Only)       Balance Overall balance assessment: Needs assistance Sitting-balance support: Single extremity supported Sitting balance-Leahy Scale: Fair Sitting balance - Comments: right lean preference and patient propped on right elbow intermittently due to fatigue, however patient can maintain midline without external support from therapist                                    Cognition Arousal/Alertness: Awake/alert Behavior During Therapy: Flat affect Overall Cognitive Status: No family/caregiver present to determine baseline cognitive functioning                                 General Comments: patient needs encouragement  to participate. he is able to follow single step commands with increased time        Exercises Total Joint Exercises Ankle Circles/Pumps: AROM, Strengthening, Both, 10 reps, Seated Long Arc Quad: AROM, Strengthening, Both, 10 reps, Seated Other Exercises Other Exercises: verbal cues for exercise technique    General Comments        Pertinent  Vitals/Pain Pain Assessment Pain Assessment: Faces Pain Location: L side of abdomen and left arm due to spasms Pain Descriptors / Indicators: Discomfort Pain Intervention(s): Limited activity within patient's tolerance    Home Living                          Prior Function            PT Goals (current goals can now be found in the care plan section) Acute Rehab PT Goals Patient Stated Goal: to have bowel function and be discharged PT Goal Formulation: With patient Time For Goal Achievement: 07/03/22 Potential to Achieve Goals: Fair Progress towards PT goals: Progressing toward goals    Frequency    Min 2X/week      PT Plan Current plan remains appropriate    Co-evaluation              AM-PAC PT "6 Clicks" Mobility   Outcome Measure  Help needed turning from your back to your side while in a flat bed without using bedrails?: A Lot Help needed moving from lying on your back to sitting on the side of a flat bed without using bedrails?: A Lot Help needed moving to and from a bed to a chair (including a wheelchair)?: A Lot Help needed standing up from a chair using your arms (e.g., wheelchair or bedside chair)?: Total Help needed to walk in hospital room?: Total Help needed climbing 3-5 steps with a railing? : Total 6 Click Score: 9    End of Session   Activity Tolerance: Patient tolerated treatment well Patient left: in bed;with call bell/phone within reach;with bed alarm set Nurse Communication: Mobility status (patient asking for cream for his arm) PT Visit Diagnosis: Muscle weakness (generalized) (M62.81);Difficulty in walking, not elsewhere classified (R26.2)     Time: 2637-8588 PT Time Calculation (min) (ACUTE ONLY): 15 min  Charges:  $Therapeutic Activity: 8-22 mins                     [name], PT, MPT    [name] 06/27/2022, 10:39 AM

## 2022-06-27 NOTE — Plan of Care (Signed)
  Problem: Clinical Measurements: Goal: Will remain free from infection Outcome: Progressing   Problem: Clinical Measurements: Goal: Diagnostic test results will improve Outcome: Progressing   Problem: Activity: Goal: Risk for activity intolerance will decrease Outcome: Progressing

## 2022-06-27 NOTE — TOC Progression Note (Signed)
Transition of Care [facility]) - Progression Note    Patient Details  Name: ROMELO SCIANDRA MRN: [id number] Date of Birth: 07-Sep-1950  Transition of Care [facility]) CM/SW Contact  Beverly Sessions, RN Phone Number: 06/27/2022, 2:25 PM  Clinical Narrative:     Per MD Current plan of care is to wean off TPN within the next 48 hours [name] updated via VM  Expected Discharge Plan: [place] Barriers to Discharge: Continued Medical Work up  Expected Discharge Plan and Services Expected Discharge Plan: [place]   Discharge Planning Services: CM Consult Post Acute Care Choice: Resumption of Svcs/PTA Provider, [place] arrangements for the past 2 months: [place]                 DME Arranged: N/A DME Agency: NA       HH Arranged: NA HH Agency: NA         Social Determinants of Health (SDOH) Interventions    Readmission Risk Interventions    06/02/2022   12:02 PM 10/17/2021   10:47 AM  Readmission Risk Prevention Plan  Transportation Screening Complete Complete  PCP or Specialist Appt within 3-5 Days  Complete  Social Work Consult for [place] Planning/Counseling  Complete  Palliative Care Screening  Not Applicable  Medication Review Press photographer) Complete Complete  PCP or Specialist appointment within 3-5 days of discharge Complete   HRI or Home Care Consult Complete   SW Recovery Care/Counseling Consult Complete   Palliative Care Screening Complete   Skilled Nursing Facility Complete

## 2022-06-27 NOTE — Consult Note (Addendum)
PHARMACY - TOTAL PARENTERAL NUTRITION CONSULT NOTE   Indication:  Intolerance to enteral feeding  Patient Measurements: Height: '5\' 11"'$  (180.3 cm) Weight: 99.5 kg (219 lb 4.8 oz) IBW/kg (Calculated) : 75.3 TPN AdjBW (KG): 92.6 Body mass index is 30.59 kg/m.  Assessment:  Patient is a 71 y/o M with medical history including Ogilvie's syndrome, HTN, HLD, COPD, CVA, GERD, gout, depression / anxiety, seizures, CAD, EtOH use disorder, CKD, diastolic CHF, colon cancer, thyroid cancer who is admitted with resolved SBO but now with worsening pseudoobstruction. GI consulted. Patient failed to respond to conservative measures with methylnaltrexone and neostigmine x 2. Patient underwent decompressive colonoscopy on 6/71 complicated by colonic distension post-procedurally. Patient underwent repeat decompressive colonoscopy on 10/3. Patient had been on tube feeds at trickle rate. Pharmacy consulted to initiate TPN to help meet nutritional goals given acute abdominal issues and intolerance to enteral nutrition.   Glucose / Insulin: Pre-diabetic range Hgb A1c (5.8% on 05/28/22).  Last 24 hrs 139-141:  5 units insulin SSI. Electrolytes: WNL>NNL Renal: Scr 0.97>1.07>1.28>NNL Hepatic: ALT 22>48>146 (3x ULN); Triglycerides 83>70  Intake / Output; MIVF: NS @ 10 ml/hr GI Imaging: 9/24 CT abdomen / pelvis: SBO and chronic colonic ileus or pseudo-obstruction 9/26 Abdominal X-ray: Air-filled distention of large and small bowel loops favoring ileus 9/27 CT abdomen / pelvis: No evidence of SBO 9/29 Abdominal X-ray: Severe dilatation of the colon as can be seen with pseudo-obstruction/colonic ileus 9/30 Abdominal X-ray: Significant interval improvement in colonic distension, which has mostly resolved 10/1 Abdominal X-ray: Persistent gaseous dilation of the colon, though mildly improved compared to 06/02/2022 10/2 Abdominal X-ray: Worsened gaseous distention of the colon 10/3 Interval placement of colonic  decompression tube with decreased colonic gaseous distention. 10/9 Gaseous distention of transverse and proximal descending colon with interval worsening. 10/12: abdominal x-ray: Slight improvement in moderate distention of the ascending and transverse colon. No evidence of bowel obstruction. 10/16: abdominal x-ray: Nonobstructive bowel gas pattern. Persistent retained oral contrast throughout the colon and rectum. Moderate gaseous distention of the colon is unchanged. GI Surgeries / Procedures:  9/29: Decompressive colonoscopy 10/3: Decompressive colonoscopy 10/13: Robotic assisted laparoscopic loop transverse colostomy  Central access: DL PICC placed 10/4 TPN start date: 10/4  Nutritional Goals: Goal TPN rate is 60 mL/hr (provides101 g of protein and 1938 kcals per day)  RD Assessment: Estimated Needs Total Energy Estimated Needs: 1900-2200kcal/day Total Protein Estimated Needs: 95-110g/day Total Fluid Estimated Needs: 1.9-2.2L/day  Current Nutrition:  Dysphagia level 1 diet (30-100% of meals)  Plan:  10/24 - Per MD, PEG tube may not be appropriate at this time d/t inc'd aspiration risk. Planning to increase dietary intake (30-100% of meals) and wean TPN ~50% today with goal off tomorrow (10/25).   Reduce rate of TPN from 60>35 mL/hr on 10/24 at 1800 TPN will provide: Protein: 105 g (70 g/L of Clinisol 15%) Dextrose: 346 g (24%) Lipids: 36 g (36 g/L) Kcals / day: 1955 Fluid volume: 1540 mL --Electrolytes in TPN: Na 38mq/L, Ca 535m/L, K 10 mEq/L, Phos 9 mmol/L and Mg 3 mEq/L. Cl:Ac 1:2  --Add standard MVI and trace elements to TPN  --SSI(sensitive) q6h -- Also receiving dronabinol 2.5>'5mg'$  BID, feeding supplement TID, NS KVO --Monitor TPN labs on Mon/Thurs   BrLorna Dibble0/24/2023,7:32 AM

## 2022-06-27 NOTE — Plan of Care (Signed)
Patient remained free of falls this shift. Pain meds given throughout the day. Appetite improving. TPN rate reduced to 85m/hr. PICC intact. VSS. Will continue to monitor.

## 2022-06-27 NOTE — Progress Notes (Signed)
PROGRESS NOTE    Calvin [name]  ZOX:[id number] DOB: [birth year] DOA: 05/28/2022 PCP: Pcp, No    Brief Narrative:  71 y.o. male with medical history significant of Ogilvie's syndrome, ileus, hypertension, hyperlipidemia, COPD, stroke with mild left-sided weakness, GERD, gout, depression with anxiety, seizure, CAD, MI,  alcohol abuse, CKD stage IIIa, dCHF, colon cancer, thyroid cancer, who presents from his facility with Nausea, vomiting, abdominal distention, abdominal pain for 2 days. Found to have new small bowel obstruction on CT abdomen with transition point in the right lower abdomen suspicious for adhesion. General surgery was consulted and NG tube was placed.   Patient also developed left-sided tingling and numbness and facial droop while in ED. CT head and MRI brain was negative for any acute intracranial abnormality and do reflect a chronic changes. 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  MRI of the cervical spine showed C3-C4 moderate to severe spinal stenosis, seen by neurosurgery, recommended outpatient follow-up.   9/25: Continued to have significant colonic distention.  NG suction continued. The patient was transferred to the stepdown unit for neostigmine dosing on 05/31/2022 and 06/01/2022.   Patient had a colonoscopy decompression on 9/29 and 10/3, followed with rectal tube. Tube feeding started on 10/30-10/3.   10/4.  Modified barium swallow showed a significant risk of aspiration.  TPN started after PICC line placed.   10/10.  Patient currently on TPN, GI cannot place PEG tube due to worsening due to abdominal distention.  Patient will need a total colectomy in order to survive.  Dr. Roosevelt Locks spoke with Dr. Doyne Keel from [facility] and he did not believe that he was a good surgical candidate and can touch back with them in a week if not improved.   10/12.  Patient considering major surgical intervention requiring colectomy and ileostomy and feeding tube.      10/13. Dr Dahlia Byes took to the OR for robotic assisted laparoscopic transverse loop colostomy creation.   10/16.  Surgical team cleared for starting liquids.  Need to get speech therapy for a swallow evaluation.   started on dysphagia diet with nectar thick liquids but still high risk for aspiration.   06/20/2022.  Need to see how much she is able to intake.  May end up needing a PEG. 10/18: Discussed with general surgery.  They have signed off at this time.  Will follow peripherally.  Patient remains on TPN.  Attempting to eat dysphagia 1 diet.  10/19: Patient's p.o. intake remains poor.  Unable or unwilling to engage in discussion regarding PEG tube placement.  Current plan of care is to optimize as much as possible and discharge to skilled nursing facility with outpatient palliative care following.  10/24: Patient's p.o. intake started to pick up a little.  He remains a very poor candidate for PEG tube placement.  Discussed with pharmacy.  Current plan of care is to wean off TPN within the next 48 hours.  Patient has no discharge possibilities while remaining on TPN.  He is not a candidate for LTAC and skilled nursing facilities will not take patients on TPN.     Assessment & Plan:   Principal Problem:   Ogilvie syndrome Active Problems:   Acute kidney injury superimposed on CKD (HCC)   Paroxysmal atrial flutter (HCC)   Chronic diastolic CHF (congestive heart failure) (HCC)   Cervical spinal stenosis   Seizures (HCC)   HTN (hypertension)   Aspiration pneumonia (HCC)   CAD (coronary artery disease)   Respiratory  distress   HLD (hyperlipidemia)   COPD (chronic obstructive pulmonary disease) (HCC)   Tobacco abuse   Depression with anxiety   Acute abdominal pain   Neuropathy   Dysphagia   Headache  Ogilvie syndrome General surgery does not think this is bowel obstruction.  Gastroenterology gave neostigmine infusion on 05/31/2022 and on 06/01/22.  Decompressive colonoscopy procedure  on 06/02/2022 and on 06/06/2022 and left a rectal tube in. Patient did not do well with barium swallow exam.  Patient currently on TPN.  Dr. Dahlia Byes took to the operating room for robotic assisted transverse colon diverting colostomy on 06/16/2022.  Repeat swallow evaluation had improved on 06/19/2022.  Now on dysphagia diet with nectar thickened liquids.  Still increased risk of aspiration.  May end up needing a PEG depending on how much he is able to intake.  RD following for calorie count.  Continue TPN.  Wean as tolerated.  We will revisit question of PEG tube placement.  Marinol added 2.5 mg p.o. twice daily on 10/20 -PEG is likely not a appropriate solution.  This will not decrease risk of aspiration Plan: Continue dronabinol 5 mg twice daily Start TPN, 50% rate today with plan to discontinue 10/25 Encourage p.o. intake TOC for skilled nursing facility placement   Acute kidney injury superimposed on CKD 3 ([place]) AKI on CKD stage IIIa.  Creatinine 1.07 on 10/21.  Slowly improving.   Paroxysmal atrial flutter (HCC) Currently rate controlled and in normal sinus rhythm.  Had paroxysmal atrial fibrillation and atrial flutter while he was in the ICU, earlier in the hospital course.  On as needed IV metoprolol.  Patient is a poor candidate for anticoagulation.  Defer at this time.  Can possibly revisit at a later date or as outpatient if patient continues to improve clinically   Cervical spinal stenosis MRI showing multilevel cervical spondylosis most pronounced at C3-C4 where moderate to severe canal stenosis and severe bilateral foraminal stenosis.  Follow-up with Dr. Izora Ribas as outpatient   Chronic diastolic CHF (congestive heart failure) ([place]) -Last EF 50% -Hold Lasix   Seizures ([place]) All antiepileptic switch to oral  Aspiration pneumonia ([place]) Completed 5 days of Zosyn on 06/05/2022.   HTN (hypertension) Blood pressure stable off of medications.   Respiratory distress Respiratory  distress on 06/01/2022 with upper airway congestion.  Improved with Decadron and racemic epinephrine infusion.  Currently not on oxygen.   CAD (coronary artery disease) No chest pain.  Troponin negative x2     HLD (hyperlipidemia) Hold Zocor   COPD (chronic obstructive pulmonary disease) (HCC) Continue nebulizers   Tobacco abuse -nicotine patch down to 14 mcg.   Depression with anxiety Can resume p.o. as needed Xanax and Zoloft   Dysphagia Did better on last swallow evaluation.  On dysphagia 1 diet   Neuropathy Restarted gabapentin   DVT prophylaxis: SQ Lovenox Code Status: Full Family Communication: Daughter [name] [phone] on 10/20, 10/24 Disposition Plan: Status is: Inpatient Remains inpatient appropriate because: Dysphagia, poor p.o. intake.  Attempting to wean TPN within the next 24 to 48 hours.  Hopefully discharge to skilled nursing facility within a few days.   Level of care: Med-Surg  Consultants:  General surgery  Procedures:  Multiple during hospitalization  Antimicrobials: None   Subjective: Seen and examined.  Reports improved appetite this morning  Objective: Vitals:   06/26/22 1534 06/26/22 2241 06/27/22 0420 06/27/22 0753  BP: 129/67 (!) 113/59  120/64  Pulse: 93 87  77  Resp: 18 17  18  Temp: 98.5 F (36.9 C) 98.4 F (36.9 C)  97.6 F (36.4 C)  TempSrc: Oral Oral  Oral  SpO2: 97% 96%  99%  Weight:   99.5 kg   Height:        Intake/Output Summary (Last 24 hours) at 06/27/2022 1346 Last data filed at 06/27/2022 0900 Gross per 24 hour  Intake 1809.46 ml  Output 1990 ml  Net -180.54 ml   Filed Weights   06/23/22 0449 06/24/22 0500 06/27/22 0420  Weight: 98.9 kg 99 kg 99.5 kg    Examination:  General exam: No distress.  More lucid this morning endorses good appetite Respiratory system: Lung clear.  Normal work of breathing.  Room air Cardiovascular system: S1-S2, RRR, no murmurs, no pedal edema Gastrointestinal  system: Soft, NT/ND, normal bowel sounds, ostomy without stool Central nervous system: Alert, oriented x2, no focal deficits Extremities: Decreased power symmetrically Skin: No rashes, lesions or ulcers Psychiatry: Judgement and insight appear impaired. Mood & affect flattened.     Data Reviewed: I have personally reviewed following labs and imaging studies  CBC: Recent Labs  Lab 06/21/22 0512 06/27/22 0618  WBC  --  8.5  HGB 9.1* 10.0*  HCT  --  32.4*  MCV  --  97.9  PLT  --  297   Basic Metabolic Panel: Recent Labs  Lab 06/21/22 0512 06/22/22 0545 06/23/22 0447 06/24/22 0619 06/26/22 0520  NA 143 141 138 137 137  K 4.2 4.3 4.7 4.7 4.6  CL 107 111 104 104 105  CO2 '28 27 28 26 25  '$ GLUCOSE 134* 121* 129* 133* 127*  BUN 53* 42* 32* 32* 40*  CREATININE 1.51* 1.12 1.16 1.07 1.28*  CALCIUM 8.5* 8.4* 8.7* 8.8* 9.0  MG  --  1.9  --   --  1.7  PHOS  --  3.6  --   --  4.6   GFR: Estimated Creatinine Clearance: 61.8 mL/min (A) (by C-G formula based on SCr of 1.28 mg/dL (H)). Liver Function Tests: Recent Labs  Lab 06/22/22 0545 06/26/22 0520  AST 27 68*  ALT 48* 146*  ALKPHOS 73 119  BILITOT 0.5 0.4  PROT 5.9* 6.6  ALBUMIN 2.7* 2.9*   No results for input(s): "LIPASE", "AMYLASE" in the last 168 hours. No results for input(s): "AMMONIA" in the last 168 hours. Coagulation Profile: No results for input(s): "INR", "PROTIME" in the last 168 hours. Cardiac Enzymes: No results for input(s): "CKTOTAL", "CKMB", "CKMBINDEX", "TROPONINI" in the last 168 hours. BNP (last 3 results) No results for input(s): "PROBNP" in the last 8760 hours. HbA1C: No results for input(s): "HGBA1C" in the last 72 hours. CBG: Recent Labs  Lab 06/26/22 1211 06/26/22 1749 06/27/22 0018 06/27/22 0614 06/27/22 1157  GLUCAP 139* 138* 139* 141* 147*   Lipid Profile: Recent Labs    06/26/22 0520  TRIG 70    Thyroid Function Tests: No results for input(s): "TSH", "T4TOTAL", "FREET4",  "T3FREE", "THYROIDAB" in the last 72 hours. Anemia Panel: No results for input(s): "VITAMINB12", "FOLATE", "FERRITIN", "TIBC", "IRON", "RETICCTPCT" in the last 72 hours. Sepsis Labs: No results for input(s): "PROCALCITON", "LATICACIDVEN" in the last 168 hours.  No results found for this or any previous visit (from the past 240 hour(s)).       Radiology Studies: No results found.      Scheduled Meds:  sodium chloride   Intravenous Once   acidophilus  1 capsule Oral Daily   aspirin  81 mg Oral Daily  Chlorhexidine Gluconate Cloth  6 each Topical Daily   diclofenac Sodium  4 g Topical QID   dronabinol  5 mg Oral BID AC   enoxaparin (LOVENOX) injection  0.5 mg/kg Subcutaneous Q24H   feeding supplement (NEPRO CARB STEADY)  237 mL Oral TID WC   gabapentin  600 mg Oral TID   insulin aspart  0-9 Units Subcutaneous Q6H   levETIRAcetam  1,000 mg Oral BID   lidocaine HCl (PF)  10 mL Other Once   LORazepam  1 mg Intravenous QHS   nicotine  14 mg Transdermal Daily   pantoprazole  40 mg Oral Daily   sertraline  50 mg Oral Daily   simvastatin  10 mg Oral QHS   sodium chloride flush  10-40 mL Intracatheter Q12H   tamsulosin  0.4 mg Oral QPM   tiotropium  18 mcg Inhalation Daily   trolamine salicylate   Topical BID   Continuous Infusions:  sodium chloride 10 mL/hr at 06/27/22 0900   TPN ADULT (ION) 60 mL/hr at 06/27/22 0900   TPN ADULT (ION)       LOS: 30 days     Sidney Ace, MD [facility]   If 7PM-7AM, please contact night-coverage  06/27/2022, 1:46 PM

## 2022-06-28 ENCOUNTER — Encounter: Payer: Self-pay | Admitting: Internal Medicine

## 2022-06-28 LAB — BASIC METABOLIC PANEL WITH GFR
Anion gap: 8 (ref 5–15)
BUN: 48 mg/dL — ABNORMAL HIGH (ref 8–23)
CO2: 25 mmol/L (ref 22–32)
Calcium: 8.9 mg/dL (ref 8.9–10.3)
Chloride: 105 mmol/L (ref 98–111)
Creatinine, Ser: 1.27 mg/dL — ABNORMAL HIGH (ref 0.61–1.24)
GFR, Estimated: 60 mL/min — ABNORMAL LOW
Glucose, Bld: 113 mg/dL — ABNORMAL HIGH (ref 70–99)
Potassium: 4.4 mmol/L (ref 3.5–5.1)
Sodium: 138 mmol/L (ref 135–145)

## 2022-06-28 LAB — MAGNESIUM: Magnesium: 1.8 mg/dL (ref 1.7–2.4)

## 2022-06-28 LAB — GLUCOSE, CAPILLARY
Glucose-Capillary: 126 mg/dL — ABNORMAL HIGH (ref 70–99)
Glucose-Capillary: 127 mg/dL — ABNORMAL HIGH (ref 70–99)
Glucose-Capillary: 127 mg/dL — ABNORMAL HIGH (ref 70–99)
Glucose-Capillary: 145 mg/dL — ABNORMAL HIGH (ref 70–99)

## 2022-06-28 LAB — PHOSPHORUS: Phosphorus: 4.5 mg/dL (ref 2.5–4.6)

## 2022-06-28 MED ORDER — ORAL CARE MOUTH RINSE
15.0000 mL | OROMUCOSAL | Status: DC
  Administered 2022-06-28 – 2022-06-30 (×10): 15 mL via OROMUCOSAL

## 2022-06-28 MED ORDER — ORAL CARE MOUTH RINSE: 15.0000 mL | OROMUCOSAL | Status: DC | PRN

## 2022-06-28 NOTE — Progress Notes (Signed)
Occupational Therapy Treatment Patient Details Name: Calvin [name] MRN: [id number] DOB: 07-Jan-1951 Today's Date: 06/28/2022   History of present illness Patient is a 71 year old male with medical history significant of Ogilvie's syndrome, ileus, hypertension, hyperlipidemia, COPD, stroke with mild left-sided weakness, GERD, gout, depression with anxiety, seizure, CAD, MI,  alcohol abuse, CKD stage IIIa, dCHF, colon cancer, thyroid cancer, who presents from his facility with Nausea, vomiting, abdominal distention, abdominal pain for 2 days. Found to have new small bowel obstruction. He developed L side tingling and numbness and facial droop while in the ED, head CT and MRI of brain was negative for acute abnormalitites. s/p laparoscopic transverse loop colostomy creation 10/13   OT comments  Upon entering session, pt reporting L sided chest that is not radiating. OT notifying RN immediately who came into room (going to page MD). Bed level activities completed out of caution. Pt reporting resolved quickly and did not experience chest pain anymore throughout remainder of session. Pt engaged in B UE/LE AROM exercises, grooming tasks, and self-feeding this date. Pt tolerated all activities well. Pt left as received with all needs in reach. Pt is making progress toward goal completion. D/C recommendation remains appropriate. OT will continue to follow acutely.     Recommendations for follow up therapy are one component of a multi-disciplinary discharge planning process, led by the attending physician.  Recommendations may be updated based on patient status, additional functional criteria and insurance authorization.    Follow Up Recommendations  Skilled nursing-short term rehab (<3 hours/day)    Assistance Recommended at Discharge Frequent or constant Supervision/Assistance  Patient can return home with the following  Two people to help with walking and/or transfers;A lot of help with  bathing/dressing/bathroom;Assistance with cooking/housework;[name];Assist for transportation;Direct supervision/assist for medications management   Equipment Recommendations  Other (comment) (defer to next venue of care)    Recommendations for Other Services      Precautions / Restrictions Precautions Precautions: Fall Precaution Comments: Seizure precautions Restrictions Weight Bearing Restrictions: No       Mobility Bed Mobility Overal bed mobility: Needs Assistance             General bed mobility comments: deferred    Transfers Overall transfer level: Needs assistance                 General transfer comment: deferred     Balance Overall balance assessment: Needs assistance     Sitting balance - Comments: deferred       Standing balance comment: deferred                           ADL either performed or assessed with clinical judgement   ADL Overall ADL's : Needs assistance/impaired Eating/Feeding: Set up;Supervision/ safety;Bed level Eating/Feeding Details (indicate cue type and reason): with nectar thick liquids, VC to do f/u dry swallow or cough to clear excess liquid in mouth before taking another sip Grooming: Wash/dry face;Set up;Bed level;Supervision/safety                                      Extremity/Trunk Assessment Upper Extremity Assessment Upper Extremity Assessment: LUE deficits/detail;Generalized weakness LUE Deficits / Details: L sided weakness due to prior stroke; noted limited ROM compared to R during AROM exercises (shoulder flexion limited to ~90 deg)   Lower Extremity  Assessment Lower Extremity Assessment: Generalized weakness;LLE deficits/detail LLE Deficits / Details: L sided weakness due to prior stroke   Cervical / Trunk Assessment Cervical / Trunk Assessment: Kyphotic    Vision       Perception     Praxis      Cognition Arousal/Alertness:  Awake/alert Behavior During Therapy: Flat affect Overall Cognitive Status: No family/caregiver present to determine baseline cognitive functioning Area of Impairment: Safety/judgement, Awareness, Problem solving                         Safety/Judgement: Decreased awareness of safety, Decreased awareness of deficits Awareness: Emergent Problem Solving: Slow processing, Requires verbal cues, Requires tactile cues          Exercises Other Exercises Other Exercises: B UE/LE AROM exercises at bed level (x10 each): dorsiflexion/plantar flexion, knee flexion/extension, hip flexion, elbow flexion/extension, shoulder flexion. Encouraged pt to continue to complete when in bed or up in chair even when therapy is not present to improve strength/ROM.    Shoulder Instructions       General Comments      Pertinent Vitals/ Pain       Pain Assessment Pain Assessment: Faces Faces Pain Scale: Hurts a little bit Pain Location: L sided chest pain (not radiating) Pain Descriptors / Indicators: Discomfort, Sharp Pain Intervention(s): Limited activity within patient's tolerance, Monitored during session  Home Living                                          Prior Functioning/Environment              Frequency  Min 2X/week        Progress Toward Goals  OT Goals(current goals can now be found in the care plan section)  Progress towards OT goals: Progressing toward goals  Acute Rehab OT Goals Patient Stated Goal: go to [facility] OT Goal Formulation: With patient Time For Goal Achievement: 07/03/22 Potential to Achieve Goals: [place] Discharge plan remains appropriate;Frequency remains appropriate    Co-evaluation                 AM-PAC OT "6 Clicks" Daily Activity     Outcome Measure   Help from another person eating meals?: A Little Help from another person taking care of personal grooming?: A Little Help from another person toileting, which  includes using toliet, bedpan, or urinal?: Total Help from another person bathing (including washing, rinsing, drying)?: A Lot Help from another person to put on and taking off regular upper body clothing?: A Little Help from another person to put on and taking off regular lower body clothing?: Total 6 Click Score: 13    End of Session    OT Visit Diagnosis: Muscle weakness (generalized) (M62.81);Unsteadiness on feet (R26.81)   Activity Tolerance Patient tolerated treatment well   Patient Left in bed;with call bell/phone within reach;with bed alarm set   Nurse Communication Mobility status;Other (comment) (chest pain)        Time: 3614-4315 OT Time Calculation (min): 28 min  Charges: OT General Charges $OT Visit: 1 Visit OT Treatments $Self Care/Home Management : 8-22 mins $Therapeutic Exercise: 8-22 mins  [facility] MS, OTR/L ascom [phone]  06/28/22, 6:33 PM

## 2022-06-28 NOTE — Progress Notes (Addendum)
Progress Note    Calvin [name]  HEN:[id number] DOB: 12/15/1950  DOA: 05/28/2022 PCP: Pcp, No      Brief Narrative:    Medical records reviewed and are as summarized below:  Calvin [name] is a 71 y.o. male with medical history significant of Ogilvie's syndrome, ileus, hypertension, hyperlipidemia, COPD, stroke with mild left-sided weakness, GERD, gout, depression with anxiety, seizure, CAD, MI,  alcohol abuse, CKD stage IIIa, dCHF, colon cancer, thyroid cancer, who presents from his facility with Nausea, vomiting, abdominal distention, abdominal pain for 2 days. Found to have new small bowel obstruction on CT abdomen with transition point in the right lower abdomen suspicious for adhesion. General surgery was consulted and NG tube was placed.   Patient also developed left-sided tingling and numbness and facial droop while in ED. CT head and MRI brain was negative for any acute intracranial abnormality and do reflect a chronic changes. 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  MRI of the cervical spine showed C3-C4 moderate to severe spinal stenosis, seen by neurosurgery, recommended outpatient follow-up.   9/25: Continued to have significant colonic distention.  NG suction continued. The patient was transferred to the stepdown unit for neostigmine dosing on 05/31/2022 and 06/01/2022.   Patient had a colonoscopy decompression on 9/29 and 10/3, followed with rectal tube. Tube feeding started on 10/30-10/3.   10/4.  Modified barium swallow showed a significant risk of aspiration.  TPN started after PICC line placed.   10/10.  Patient currently on TPN, GI cannot place PEG tube due to worsening due to abdominal distention.  Patient will need a total colectomy in order to survive.  Dr. Roosevelt Locks spoke with Dr. Doyne Keel from [facility] and he did not believe that he was a good surgical candidate and can touch back with them in a week if not improved.   10/12.  Patient  considering major surgical intervention requiring colectomy and ileostomy and feeding tube.     10/13. Dr Dahlia Byes took to the OR for robotic assisted laparoscopic transverse loop colostomy creation.   10/16.  Surgical team cleared for starting liquids.  Need to get speech therapy for a swallow evaluation.   started on dysphagia diet with nectar thick liquids but still high risk for aspiration.   06/20/2022.  Need to see how much she is able to intake.  May end up needing a PEG. 10/18: Discussed with general surgery.  They have signed off at this time.  Will follow peripherally.  Patient remains on TPN.  Attempting to eat dysphagia 1 diet.   10/19: Patient's p.o. intake remains poor.  Unable or unwilling to engage in discussion regarding PEG tube placement.  Current plan of care is to optimize as much as possible and discharge to skilled nursing facility with outpatient palliative care following.   10/24: Patient's p.o. intake started to pick up a little.  He remains a very poor candidate for PEG tube placement.  Discussed with pharmacy.  Current plan of care is to wean off TPN within the next 48 hours.  Patient has no discharge possibilities while remaining on TPN.  He is not a candidate for LTAC and skilled nursing facilities will not take patients on TPN.       Assessment/Plan:   Principal Problem:   Ogilvie syndrome Active Problems:   Acute kidney injury superimposed on CKD (HCC)   Paroxysmal atrial flutter (HCC)   Chronic diastolic CHF (congestive heart failure) (HCC)   Cervical  spinal stenosis   Seizures (HCC)   HTN (hypertension)   Aspiration pneumonia (HCC)   CAD (coronary artery disease)   Respiratory distress   HLD (hyperlipidemia)   COPD (chronic obstructive pulmonary disease) (HCC)   Tobacco abuse   Depression with anxiety   Acute abdominal pain   Neuropathy   Dysphagia   Headache   Nutrition Problem: Inadequate oral intake Etiology: acute illness  Signs/Symptoms:  other (comment) (pt on NPO/clear liquid diet since admission)   Body mass index is 30.95 kg/m.  Ogilvie syndrome General surgery does not think this is bowel obstruction.  Gastroenterology gave neostigmine infusion on 05/31/2022 and on 06/01/22.  Decompressive colonoscopy procedure on 06/02/2022 and on 06/06/2022 and left a rectal tube in. Patient did not do well with barium swallow exam.  Patient currently on TPN.  Dr. Dahlia Byes took to the operating room for robotic assisted transverse colon diverting colostomy on 06/16/2022.  Repeat swallow evaluation had improved on 06/19/2022.  Now on dysphagia diet with nectar thickened liquids.  Still increased risk of aspiration.  May end up needing a PEG depending on how much he is able to intake.  RD following for calorie count.  Continue TPN.  Wean as tolerated.  We will revisit question of PEG tube placement.  Marinol added 2.5 mg p.o. twice daily on 10/20 -PEG is likely not a appropriate solution.  This will not decrease risk of aspiration Continue dronabinol.  Plan to discontinue TPN today. Continue dysphagia 1 diet as tolerated   Acute kidney injury superimposed on CKD 3 ([place]) AKI on CKD stage IIIa.  Improved.  Paroxysmal atrial flutter (HCC) Currently rate controlled and in normal sinus rhythm.  Had paroxysmal atrial fibrillation and atrial flutter while he was in the ICU, earlier in the hospital course.  On as needed IV metoprolol.  Patient is a poor candidate for anticoagulation.  Defer at this time.     Cervical spinal stenosis MRI showing multilevel cervical spondylosis most pronounced at C3-C4 where moderate to severe canal stenosis and severe bilateral foraminal stenosis.  Follow-up with Dr. Izora Ribas as outpatient   Chronic diastolic CHF (congestive heart failure) ([place]) -Last EF 50% -Hold Lasix   Seizures ([place]) continue Keppra   Aspiration pneumonia ([place]) Completed 5 days of Zosyn on 06/05/2022.   Respiratory distress Respiratory  distress on 06/01/2022 with upper airway congestion.  Improved with Decadron and racemic epinephrine infusion.  Currently not on oxygen.   COPD (chronic obstructive pulmonary disease) (HCC) Continue nebulizers     Other comorbidities include hyperlipidemia, CAD, hypertension, depression with anxiety, tobacco use disorder, neuropathy     Diet Order             DIET - DYS 1 Room service appropriate? Yes with Assist; Fluid consistency: Nectar Thick  Diet effective now                            Consultants: Gastroenterologist General surgeon Neurologist Neurosurgeon  Procedures: Robotic assisted Laparoscopic loop transverse colostomy on 06/16/2022 Colonoscopy on 06/06/2022 and 06/02/2022    Medications:    sodium chloride   Intravenous Once   acidophilus  1 capsule Oral Daily   aspirin  81 mg Oral Daily   Chlorhexidine Gluconate Cloth  6 each Topical Daily   diclofenac Sodium  4 g Topical QID   dronabinol  5 mg Oral BID AC   enoxaparin (LOVENOX) injection  0.5 mg/kg Subcutaneous Q24H   feeding supplement (  NEPRO CARB STEADY)  237 mL Oral TID WC   gabapentin  600 mg Oral TID   insulin aspart  0-9 Units Subcutaneous Q6H   levETIRAcetam  1,000 mg Oral BID   lidocaine HCl (PF)  10 mL Other Once   LORazepam  1 mg Intravenous QHS   nicotine  14 mg Transdermal Daily   mouth rinse  15 mL Mouth Rinse 4 times per day   pantoprazole  40 mg Oral Daily   sertraline  50 mg Oral Daily   simvastatin  10 mg Oral QHS   sodium chloride flush  10-40 mL Intracatheter Q12H   tamsulosin  0.4 mg Oral QPM   tiotropium  18 mcg Inhalation Daily   trolamine salicylate   Topical BID   Continuous Infusions:  sodium chloride Stopped (06/27/22 1930)   TPN ADULT (ION) 35 mL/hr at 06/27/22 1718     Anti-infectives (From admission, onward)    Start     Dose/Rate Route Frequency Ordered Stop   06/16/22 2000  cefoTEtan (CEFOTAN) 2 g in sodium chloride 0.9 % 100 mL IVPB        2  g 200 mL/hr over 30 Minutes Intravenous  Once 06/16/22 1712 06/16/22 2146   06/16/22 1253  sodium chloride 0.9 % with cefoTEtan (CEFOTAN) ADS Med       Note to Pharmacy: Josephina Shih A: cabinet override      06/16/22 1253 06/17/22 0059   06/16/22 1230  cefoTEtan (CEFOTAN) 2 g in sodium chloride 0.9 % 100 mL IVPB        2 g 200 mL/hr over 30 Minutes Intravenous  Once 06/16/22 1136 06/16/22 1400   06/01/22 1400  piperacillin-tazobactam (ZOSYN) IVPB 3.375 g        3.375 g 12.5 mL/hr over 240 Minutes Intravenous Every 8 hours 06/01/22 1218 06/05/22 2359              Family Communication/Anticipated D/C date and plan/Code Status   DVT prophylaxis: SCD's Start: 05/28/22 1621     Code Status: Full Code  Family Communication: None Disposition Plan: Plan to discharge to SNF   Status is: Inpatient Remains inpatient appropriate because: Awaiting placement to SNF       Subjective:   Interval events noted.  No abdominal pain, vomiting or diarrhea.  Objective:    Vitals:   06/27/22 2015 06/28/22 0500 06/28/22 0601 06/28/22 0818  BP: 116/70  111/68 121/71  Pulse: 83  78 78  Resp: 20  16   Temp: 98.4 F (36.9 C)  (!) 97.5 F (36.4 C) 98.3 F (36.8 C)  TempSrc:      SpO2:   98% 100%  Weight:  100.7 kg    Height:       No data found.   Intake/Output Summary (Last 24 hours) at 06/28/2022 1006 Last data filed at 06/28/2022 0400 Gross per 24 hour  Intake 1092.75 ml  Output 205 ml  Net 887.75 ml   Filed Weights   06/24/22 0500 06/27/22 0420 06/28/22 0500  Weight: 99 kg 99.5 kg 100.7 kg    Exam:  GEN: NAD SKIN: Warm and dry EYES: No pallor or icterus ENT: MMM CV: RRR PULM: CTA B ABD: soft, distended, NT, +BS, +colostomy bag with yellowish feces CNS: AAO x 3, non focal EXT: No edema or tenderness GU: Foley catheter draining amber urine        Data Reviewed:   I have personally reviewed following labs and imaging studies:  Labs: Labs show  the following:   Basic Metabolic Panel: Recent Labs  Lab 06/22/22 0545 06/23/22 0447 06/24/22 0619 06/26/22 0520 06/28/22 0615  NA 141 138 137 137 138  K 4.3 4.7 4.7 4.6 4.4  CL 111 104 104 105 105  CO2 '27 28 26 25 25  '$ GLUCOSE 121* 129* 133* 127* 113*  BUN 42* 32* 32* 40* 48*  CREATININE 1.12 1.16 1.07 1.28* 1.27*  CALCIUM 8.4* 8.7* 8.8* 9.0 8.9  MG 1.9  --   --  1.7 1.8  PHOS 3.6  --   --  4.6 4.5   GFR Estimated Creatinine Clearance: 62.6 mL/min (A) (by C-G formula based on SCr of 1.27 mg/dL (H)). Liver Function Tests: Recent Labs  Lab 06/22/22 0545 06/26/22 0520  AST 27 68*  ALT 48* 146*  ALKPHOS 73 119  BILITOT 0.5 0.4  PROT 5.9* 6.6  ALBUMIN 2.7* 2.9*   No results for input(s): "LIPASE", "AMYLASE" in the last 168 hours. No results for input(s): "AMMONIA" in the last 168 hours. Coagulation profile No results for input(s): "INR", "PROTIME" in the last 168 hours.  CBC: Recent Labs  Lab 06/27/22 0618  WBC 8.5  HGB 10.0*  HCT 32.4*  MCV 97.9  PLT 214   Cardiac Enzymes: No results for input(s): "CKTOTAL", "CKMB", "CKMBINDEX", "TROPONINI" in the last 168 hours. BNP (last 3 results) No results for input(s): "PROBNP" in the last 8760 hours. CBG: Recent Labs  Lab 06/27/22 0614 06/27/22 1157 06/27/22 1657 06/28/22 0013 06/28/22 0447  GLUCAP 141* 147* 116* 127* 127*   D-Dimer: No results for input(s): "DDIMER" in the last 72 hours. Hgb A1c: No results for input(s): "HGBA1C" in the last 72 hours. Lipid Profile: Recent Labs    06/26/22 0520  TRIG 70   Thyroid function studies: No results for input(s): "TSH", "T4TOTAL", "T3FREE", "THYROIDAB" in the last 72 hours.  Invalid input(s): "FREET3" Anemia work up: No results for input(s): "VITAMINB12", "FOLATE", "FERRITIN", "TIBC", "IRON", "RETICCTPCT" in the last 72 hours. Sepsis Labs: Recent Labs  Lab 06/27/22 0618  WBC 8.5    Microbiology No results found for this or any previous visit (from  the past 240 hour(s)).  Procedures and diagnostic studies:  No results found.             LOS: 31 days   [place] Copywriter, advertising on [email or web address]. If 7PM-7AM, please contact night-coverage at [email or web address]     06/28/2022, 10:06 AM

## 2022-06-28 NOTE — Progress Notes (Addendum)
Nutrition Follow-up  DOCUMENTATION CODES:   Not applicable  INTERVENTION:   Daily weights    Continue Nepro Shake po TID, each supplement provides 425 kcal and 19 grams protein    Decrease Magic cup to BID with meals, each supplement provides 290 kcal and 9 grams of protein    Continue Hormel Shake TID, each supplement provides 520 kcals and 22 grams protein   Feeding assistance with meals  NUTRITION DIAGNOSIS:   Inadequate oral intake related to acute illness as evidenced by other (comment) (pt on NPO/clear liquid diet since admission).  Progressing   GOAL:   Patient will meet greater than or equal to 90% of their needs  Progressing   MONITOR:   PO intake, Supplement acceptance, Labs, Weight trends, Skin, I & O's  REASON FOR ASSESSMENT:   Rounds    ASSESSMENT:   71 y.o. male with medical history significant of Ogilvie's syndrome, ileus, hypertension, hyperlipidemia, COPD, MI, stroke with mild left-sided weakness, GERD, gout, depression with anxiety, seizure, DDD, CAD, MI,  alcohol abuse, CKD stage IIIa, dCHF, colon cancer and thyroid cancer who is admitted with colonic ileus secondary to Ogilvie syndrome and possible SBO.  -Pt s/p robotic assisted laparoscopic loop transverse colostomy 10/13  Reviewed I/O's: +606 ml x 24 hours and -2 L since 06/14/22  UOP: 600 ml x 24 hours   Colostomy output: 25 ml x 24 hours  Per pharmacy note, TPN has been weaned to 50% yesterday and plan to d/c TPN today.   Pt sitting up in bed at time of visit. He consumed 75% of pureed eggs and sausage this morning. Per pt, his appetite is improving; noted meal completions 75-90% of meals over the past 24 hours. RD assisted pt with opening up water and protein shake on tray and transferring to cups so it is easier for pt to consume. Per pt, he likes the [employer] and [employer], but is tired of the chocolate flavors. Pt also does not like receiving Magic Cups at morning meals ("Who eats  ice cream for breakfast?").   Per MD, no plans to place PEG at this time, as this will not decrease pt's risk for aspiration.   Case discussed with RN, who confirmed plan to wean off TPN and pt with improved oral intake. Plan to d/c to SNF ([facility]) once pt weans off TPN.   Medications reviewed and include marinol, ativan, and keppra.   Labs reviewed: CBGS: 116-147 (inpatient orders for glycemic control are 0-9 units insulin aspart every 6 hours).    Diet Order:   Diet Order             DIET - DYS 1 Room service appropriate? Yes with Assist; Fluid consistency: Nectar Thick  Diet effective now                   EDUCATION NEEDS:   No education needs have been identified at this time  Skin:  Skin Assessment: Skin Integrity Issues: Skin Integrity Issues:: Incisions Incisions: closed abdomen s/p new colostomy  Last BM:  06/27/22 (5 ml via colostomy)  Height:   Ht Readings from Last 1 Encounters:  06/16/22 '5\' 11"'$  (1.803 m)    Weight:   Wt Readings from Last 1 Encounters:  06/28/22 100.7 kg    Ideal Body Weight:  78 kg  BMI:  Body mass index is 30.95 kg/m.  Estimated Nutritional Needs:   Kcal:  1900-2200kcal/day  Protein:  95-110g/day  Fluid:  1.9-2.2L/day  Loistine Chance, RD, LDN, [place] Registered Dietitian II Certified Diabetes Care and Education Specialist Please refer to [facility] for RD and/or RD on-call/weekend/after hours pager

## 2022-06-28 NOTE — Progress Notes (Signed)
Physical Therapy Treatment Patient Details Name: Calvin [name] MRN: [id number] DOB: 1951-05-16 Today's Date: 06/28/2022   History of Present Illness Patient is a 71 year old male with medical history significant of Ogilvie's syndrome, ileus, hypertension, hyperlipidemia, COPD, stroke with mild left-sided weakness, GERD, gout, depression with anxiety, seizure, CAD, MI,  alcohol abuse, CKD stage IIIa, dCHF, colon cancer, thyroid cancer, who presents from his facility with Nausea, vomiting, abdominal distention, abdominal pain for 2 days. Found to have new small bowel obstruction. He developed L side tingling and numbness and facial droop while in the ED, head CT and MRI of brain was negative for acute abnormalitites. s/p laparoscopic transverse loop colostomy creation 10/13    PT Comments    Pt was long sitting in bed. Alert but disoriented x 1. Was able to give good history of past medical and living situation. He was able to exit bed, stand several times, and take several steps along EOB. Pt gets slightly SOB but overall tolerated session well.  He is not at baseline and will benefit from SNF at DC to maximize his independence while decreasing caregiver burden.    Recommendations for follow up therapy are one component of a multi-disciplinary discharge planning process, led by the attending physician.  Recommendations may be updated based on patient status, additional functional criteria and insurance authorization.  Follow Up Recommendations  Skilled nursing-short term rehab (<3 hours/day)     Assistance Recommended at Discharge Frequent or constant Supervision/Assistance  Patient can return home with the following Assistance with cooking/housework;[name];Assist for transportation;Direct supervision/assist for medications management;Direct supervision/assist for financial management;Two people to help with walking and/or transfers;Two people to help with  bathing/dressing/bathroom   Equipment Recommendations  None recommended by PT       Precautions / Restrictions Precautions Precautions: Fall Restrictions Weight Bearing Restrictions: No     Mobility  Bed Mobility Overal bed mobility: Needs Assistance Bed Mobility: Supine to Sit, Sit to Supine  Supine to sit: Min assist, Mod assist, HOB elevated Sit to supine: Mod assist General bed mobility comments: increased time required to exit and re-enter bed    Transfers Overall transfer level: Needs assistance Equipment used: Rolling walker (2 wheels) Transfers: Sit to/from Stand Sit to Stand: Mod assist, From elevated surface   General transfer comment: Min assist + increased time from very elevated bed height. Mod assist from slightly elevated bed height    Ambulation/Gait Ambulation/Gait assistance: Mod assist Gait Distance (Feet): 3 Feet Assistive device: Rolling walker (2 wheels) Gait Pattern/deviations: Decreased stride length, Wide base of support, Leaning posteriorly Gait velocity: decreased     General Gait Details: pt ambulated along EOB but rested not to leave EOB due to SOB. he does gel very minimally SOB.    Balance Overall balance assessment: Needs assistance Sitting-balance support: Feet supported Sitting balance-Leahy Scale: Fair     Standing balance support: During functional activity, Bilateral upper extremity supported Standing balance-Leahy Scale: Poor Standing balance comment: Was able to stand EOB with BUE support with close SBA.       Cognition Arousal/Alertness: Awake/alert Behavior During Therapy: Flat affect   Area of Impairment: Safety/judgement, Awareness, Problem solving      Safety/Judgement: Decreased awareness of safety, Decreased awareness of deficits     General Comments: Pt is A and O x 3. able to follow commands throughout. unaware he has a colostomy. asked Pryor Curia about having a poop in BR.  General Comments  General comments (skin integrity, edema, etc.): performed several standing exercises EOB with BUE support on RW      Pertinent Vitals/Pain Pain Assessment Pain Assessment: 0-10 Pain Score: 6  Pain Location: LUE Pain Descriptors / Indicators: Discomfort Pain Intervention(s): Limited activity within patient's tolerance, Monitored during session, Premedicated before session, Repositioned     PT Goals (current goals can now be found in the care plan section) Acute Rehab PT Goals Patient Stated Goal: none stated Progress towards PT goals: Progressing toward goals    Frequency    Min 2X/week      PT Plan Current plan remains appropriate    Co-evaluation     PT goals addressed during session: Mobility/safety with mobility        AM-PAC PT "6 Clicks" Mobility   Outcome Measure  Help needed turning from your back to your side while in a flat bed without using bedrails?: A Lot Help needed moving from lying on your back to sitting on the side of a flat bed without using bedrails?: A Lot Help needed moving to and from a bed to a chair (including a wheelchair)?: A Lot Help needed standing up from a chair using your arms (e.g., wheelchair or bedside chair)?: A Lot Help needed to walk in hospital room?: A Lot Help needed climbing 3-5 steps with a railing? : Total 6 Click Score: 11    End of Session Equipment Utilized During Treatment: Gait belt Activity Tolerance: Patient tolerated treatment well Patient left: in bed;with call bell/phone within reach;with bed alarm set Nurse Communication: Mobility status PT Visit Diagnosis: Muscle weakness (generalized) (M62.81);Difficulty in walking, not elsewhere classified (R26.2)     Time: 8185-6314 PT Time Calculation (min) (ACUTE ONLY): 24 min  Charges:  $Therapeutic Exercise: 8-22 mins $Therapeutic Activity: 8-22 mins                     Julaine Fusi PTA 06/28/22, 2:09 PM

## 2022-06-29 LAB — COMPREHENSIVE METABOLIC PANEL
ALT: 131 U/L — ABNORMAL HIGH (ref 0–44)
AST: 55 U/L — ABNORMAL HIGH (ref 15–41)
Albumin: 2.8 g/dL — ABNORMAL LOW (ref 3.5–5.0)
Alkaline Phosphatase: 135 U/L — ABNORMAL HIGH (ref 38–126)
Anion gap: 7 (ref 5–15)
BUN: 39 mg/dL — ABNORMAL HIGH (ref 8–23)
CO2: 27 mmol/L (ref 22–32)
Calcium: 8.5 mg/dL — ABNORMAL LOW (ref 8.9–10.3)
Chloride: 104 mmol/L (ref 98–111)
Creatinine, Ser: 1.2 mg/dL (ref 0.61–1.24)
GFR, Estimated: >60 mL/min (ref >60)
Glucose, Bld: 96 mg/dL (ref 70–99)
Potassium: 4.7 mmol/L (ref 3.5–5.1)
Sodium: 138 mmol/L (ref 135–145)
Total Bilirubin: 0.6 mg/dL (ref 0.3–1.2)
Total Protein: 6.5 g/dL (ref 6.5–8.1)

## 2022-06-29 LAB — GLUCOSE, CAPILLARY
Glucose-Capillary: 105 mg/dL — ABNORMAL HIGH (ref 70–99)
Glucose-Capillary: 108 mg/dL — ABNORMAL HIGH (ref 70–99)
Glucose-Capillary: 113 mg/dL — ABNORMAL HIGH (ref 70–99)
Glucose-Capillary: 120 mg/dL — ABNORMAL HIGH (ref 70–99)
Glucose-Capillary: 95 mg/dL (ref 70–99)

## 2022-06-29 LAB — MAGNESIUM: Magnesium: 2 mg/dL (ref 1.7–2.4)

## 2022-06-29 LAB — PHOSPHORUS: Phosphorus: 4.1 mg/dL (ref 2.5–4.6)

## 2022-06-29 LAB — TRIGLYCERIDES: Triglycerides: 66 mg/dL (ref <150)

## 2022-06-29 NOTE — Care Management Important Message (Signed)
Important Message  Patient Details  Name: Calvin [name] MRN: [id number] Date of Birth: July 19, 1951   Medicare Important Message Given:  Yes     Dannette Barbara 06/29/2022, 3:43 PM

## 2022-06-29 NOTE — Progress Notes (Addendum)
Progress Note    Calvin [name]  HEN:[id number] DOB: 12/15/1950  DOA: 05/28/2022 PCP: Pcp, No      Brief Narrative:    Medical records reviewed and are as summarized below:  Calvin [name] is a 71 y.o. male with medical history significant of Ogilvie's syndrome, ileus, hypertension, hyperlipidemia, COPD, stroke with mild left-sided weakness, GERD, gout, depression with anxiety, seizure, CAD, MI,  alcohol abuse, CKD stage IIIa, dCHF, colon cancer, thyroid cancer, who presents from his facility with Nausea, vomiting, abdominal distention, abdominal pain for 2 days. Found to have new small bowel obstruction on CT abdomen with transition point in the right lower abdomen suspicious for adhesion. General surgery was consulted and NG tube was placed.   Patient also developed left-sided tingling and numbness and facial droop while in ED. CT head and MRI brain was negative for any acute intracranial abnormality and do reflect a chronic changes. 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  MRI of the cervical spine showed C3-C4 moderate to severe spinal stenosis, seen by neurosurgery, recommended outpatient follow-up.   9/25: Continued to have significant colonic distention.  NG suction continued. The patient was transferred to the stepdown unit for neostigmine dosing on 05/31/2022 and 06/01/2022.   Patient had a colonoscopy decompression on 9/29 and 10/3, followed with rectal tube. Tube feeding started on 10/30-10/3.   10/4.  Modified barium swallow showed a significant risk of aspiration.  TPN started after PICC line placed.   10/10.  Patient currently on TPN, GI cannot place PEG tube due to worsening due to abdominal distention.  Patient will need a total colectomy in order to survive.  Dr. Roosevelt Locks spoke with Dr. Doyne Keel from [facility] and he did not believe that he was a good surgical candidate and can touch back with them in a week if not improved.   10/12.  Patient  considering major surgical intervention requiring colectomy and ileostomy and feeding tube.     10/13. Dr Dahlia Byes took to the OR for robotic assisted laparoscopic transverse loop colostomy creation.   10/16.  Surgical team cleared for starting liquids.  Need to get speech therapy for a swallow evaluation.   started on dysphagia diet with nectar thick liquids but still high risk for aspiration.   06/20/2022.  Need to see how much she is able to intake.  May end up needing a PEG. 10/18: Discussed with general surgery.  They have signed off at this time.  Will follow peripherally.  Patient remains on TPN.  Attempting to eat dysphagia 1 diet.   10/19: Patient's p.o. intake remains poor.  Unable or unwilling to engage in discussion regarding PEG tube placement.  Current plan of care is to optimize as much as possible and discharge to skilled nursing facility with outpatient palliative care following.   10/24: Patient's p.o. intake started to pick up a little.  He remains a very poor candidate for PEG tube placement.  Discussed with pharmacy.  Current plan of care is to wean off TPN within the next 48 hours.  Patient has no discharge possibilities while remaining on TPN.  He is not a candidate for LTAC and skilled nursing facilities will not take patients on TPN.       Assessment/Plan:   Principal Problem:   Ogilvie syndrome Active Problems:   Acute kidney injury superimposed on CKD (HCC)   Paroxysmal atrial flutter (HCC)   Chronic diastolic CHF (congestive heart failure) (HCC)   Cervical  spinal stenosis   Seizures (HCC)   HTN (hypertension)   Aspiration pneumonia (HCC)   CAD (coronary artery disease)   Respiratory distress   HLD (hyperlipidemia)   COPD (chronic obstructive pulmonary disease) (HCC)   Tobacco abuse   Depression with anxiety   Acute abdominal pain   Neuropathy   Dysphagia   Headache   Nutrition Problem: Inadequate oral intake Etiology: acute illness  Signs/Symptoms:  other (comment) (pt on NPO/clear liquid diet since admission)   Body mass index is 30.95 kg/m.  Ogilvie syndrome Gastroenterology gave neostigmine infusion on 05/31/2022 and on 06/01/22.  Decompressive colonoscopy procedure on 06/02/2022 and on 06/06/2022 and left a rectal tube in. Patient did not do well with barium swallow exam.  Dr. Dahlia Byes took to the operating room for robotic assisted transverse colon diverting colostomy on 06/16/2022 recurrent and recalcitrant Ogilvie's syndrome.  Repeat swallow evaluation had improved on 06/19/2022.  He is tolerating dysphagia 1 diet with nectar thick liquids.  He has been weaned off TPN.  Acute kidney injury superimposed on CKD 3 ([place]) AKI on CKD stage IIIa.  Improved.  Paroxysmal atrial flutter (HCC) Currently rate controlled and in normal sinus rhythm.  Had paroxysmal atrial fibrillation and atrial flutter while he was in the ICU, earlier in the hospital course.  On as needed IV metoprolol.  Patient is a poor candidate for anticoagulation.  Defer at this time.     Cervical spinal stenosis MRI showing multilevel cervical spondylosis most pronounced at C3-C4 where moderate to severe canal stenosis and severe bilateral foraminal stenosis.  Follow-up with Dr. Izora Ribas as outpatient   Chronic diastolic CHF (congestive heart failure) ([place]) -Last EF 50% -Hold Lasix   Seizures ([place]) continue Keppra   Aspiration pneumonia ([place]) Completed 5 days of Zosyn on 06/05/2022.   Respiratory distress Respiratory distress on 06/01/2022 with upper airway congestion.  Improved with Decadron and racemic epinephrine infusion.   He is tolerating room air.   COPD (chronic obstructive pulmonary disease) (HCC) Continue nebulizers  Remove Foley catheter and attempt voiding trial tomorrow morning     Other comorbidities include hyperlipidemia, CAD, hypertension, depression with anxiety, tobacco use disorder, neuropathy     Diet Order             DIET - DYS 1 Room  service appropriate? Yes with Assist; Fluid consistency: Nectar Thick  Diet effective now                            Consultants: Gastroenterologist General surgeon Neurologist Neurosurgeon  Procedures: Robotic assisted Laparoscopic loop transverse colostomy on 06/16/2022 Colonoscopy on 06/06/2022 and 06/02/2022    Medications:    sodium chloride   Intravenous Once   acidophilus  1 capsule Oral Daily   aspirin  81 mg Oral Daily   Chlorhexidine Gluconate Cloth  6 each Topical Daily   diclofenac Sodium  4 g Topical QID   dronabinol  5 mg Oral BID AC   enoxaparin (LOVENOX) injection  0.5 mg/kg Subcutaneous Q24H   feeding supplement (NEPRO CARB STEADY)  237 mL Oral TID WC   gabapentin  600 mg Oral TID   insulin aspart  0-9 Units Subcutaneous Q6H   levETIRAcetam  1,000 mg Oral BID   lidocaine HCl (PF)  10 mL Other Once   LORazepam  1 mg Intravenous QHS   nicotine  14 mg Transdermal Daily   mouth rinse  15 mL Mouth Rinse 4 times per day  pantoprazole  40 mg Oral Daily   sertraline  50 mg Oral Daily   simvastatin  10 mg Oral QHS   sodium chloride flush  10-40 mL Intracatheter Q12H   tamsulosin  0.4 mg Oral QPM   tiotropium  18 mcg Inhalation Daily   trolamine salicylate   Topical BID   Continuous Infusions:  sodium chloride Stopped (06/27/22 1930)     Anti-infectives (From admission, onward)    Start     Dose/Rate Route Frequency Ordered Stop   06/16/22 2000  cefoTEtan (CEFOTAN) 2 g in sodium chloride 0.9 % 100 mL IVPB        2 g 200 mL/hr over 30 Minutes Intravenous  Once 06/16/22 1712 06/16/22 2146   06/16/22 1253  sodium chloride 0.9 % with cefoTEtan (CEFOTAN) ADS Med       Note to Pharmacy: Josephina Shih A: cabinet override      06/16/22 1253 06/17/22 0059   06/16/22 1230  cefoTEtan (CEFOTAN) 2 g in sodium chloride 0.9 % 100 mL IVPB        2 g 200 mL/hr over 30 Minutes Intravenous  Once 06/16/22 1136 06/16/22 1400   06/01/22 1400   piperacillin-tazobactam (ZOSYN) IVPB 3.375 g        3.375 g 12.5 mL/hr over 240 Minutes Intravenous Every 8 hours 06/01/22 1218 06/05/22 2359              Family Communication/Anticipated D/C date and plan/Code Status   DVT prophylaxis: SCD's Start: 05/28/22 1621     Code Status: Full Code  Family Communication: None Disposition Plan: Plan to discharge to SNF   Status is: Inpatient Remains inpatient appropriate because: Awaiting placement to SNF       Subjective:   Interval events noted.  "I ate well".  He feels better today.  Objective:    Vitals:   06/28/22 1604 06/28/22 1938 06/29/22 0450 06/29/22 0735  BP: 131/81 119/70 105/67 122/65  Pulse: 84 82 74 79  Resp:  18  18  Temp:  97.8 F (36.6 C)  98.4 F (36.9 C)  TempSrc:  Oral  Oral  SpO2: 97% 99% 100% 96%  Weight:      Height:       No data found.   Intake/Output Summary (Last 24 hours) at 06/29/2022 1217 Last data filed at 06/29/2022 0910 Gross per 24 hour  Intake 931.57 ml  Output 2650 ml  Net -1718.43 ml   Filed Weights   06/24/22 0500 06/27/22 0420 06/28/22 0500  Weight: 99 kg 99.5 kg 100.7 kg    Exam:  GEN: NAD SKIN: Warm and dry EYES: No pallor or icterus ENT: MMM CV: RRR PULM: CTA B ABD: soft, obese, NT, +BS, +colostomy bag with yellow stools CNS: AAO x 3, non focal EXT: No edema or tenderness GU: Foley catheter with amber urine       Data Reviewed:   I have personally reviewed following labs and imaging studies:  Labs: Labs show the following:   Basic Metabolic Panel: Recent Labs  Lab 06/23/22 0447 06/24/22 0619 06/26/22 0520 06/28/22 0615 06/29/22 0545  NA 138 137 137 138 138  K 4.7 4.7 4.6 4.4 4.7  CL 104 104 105 105 104  CO2 '28 26 25 25 27  '$ GLUCOSE 129* 133* 127* 113* 96  BUN 32* 32* 40* 48* 39*  CREATININE 1.16 1.07 1.28* 1.27* 1.20  CALCIUM 8.7* 8.8* 9.0 8.9 8.5*  MG  --   --  1.7  1.8 2.0  PHOS  --   --  4.6 4.5 4.1   GFR Estimated  Creatinine Clearance: 66.3 mL/min (by C-G formula based on SCr of 1.2 mg/dL). Liver Function Tests: Recent Labs  Lab 06/26/22 0520 06/29/22 0545  AST 68* 55*  ALT 146* 131*  ALKPHOS 119 135*  BILITOT 0.4 0.6  PROT 6.6 6.5  ALBUMIN 2.9* 2.8*   No results for input(s): "LIPASE", "AMYLASE" in the last 168 hours. No results for input(s): "AMMONIA" in the last 168 hours. Coagulation profile No results for input(s): "INR", "PROTIME" in the last 168 hours.  CBC: Recent Labs  Lab 06/27/22 0618  WBC 8.5  HGB 10.0*  HCT 32.4*  MCV 97.9  PLT 214   Cardiac Enzymes: No results for input(s): "CKTOTAL", "CKMB", "CKMBINDEX", "TROPONINI" in the last 168 hours. BNP (last 3 results) No results for input(s): "PROBNP" in the last 8760 hours. CBG: Recent Labs  Lab 06/28/22 1203 06/28/22 1759 06/29/22 0014 06/29/22 0557 06/29/22 1129  GLUCAP 145* 126* 120* 95 105*   D-Dimer: No results for input(s): "DDIMER" in the last 72 hours. Hgb A1c: No results for input(s): "HGBA1C" in the last 72 hours. Lipid Profile: Recent Labs    06/29/22 0545  TRIG 66   Thyroid function studies: No results for input(s): "TSH", "T4TOTAL", "T3FREE", "THYROIDAB" in the last 72 hours.  Invalid input(s): "FREET3" Anemia work up: No results for input(s): "VITAMINB12", "FOLATE", "FERRITIN", "TIBC", "IRON", "RETICCTPCT" in the last 72 hours. Sepsis Labs: Recent Labs  Lab 06/27/22 0618  WBC 8.5    Microbiology No results found for this or any previous visit (from the past 240 hour(s)).  Procedures and diagnostic studies:  No results found.             LOS: 32 days   [place] Copywriter, advertising on [email or web address]. If 7PM-7AM, please contact night-coverage at [email or web address]     06/29/2022, 12:17 PM

## 2022-06-29 NOTE — TOC Progression Note (Signed)
Transition of Care [facility]) - Progression Note    Patient Details  Name: Calvin [name] MRN: [id number] Date of Birth: 1951/06/17  Transition [facility]) CM/SW Contact  Beverly Sessions, RN Phone Number: 06/29/2022, 3:14 PM  Clinical Narrative:    Was able to access navi portal.  Unable to find patient in portal.  Contacted navi rep who states member is not manage by [place] Notified tammy and gina at [facility] and requested they start auth    Expected Discharge Plan: [place] Barriers to Discharge: Continued Medical Work up  Expected Discharge Plan and Services Expected Discharge Plan: [place]   Discharge Planning Services: CM Consult Post Acute Care Choice: Resumption of Svcs/PTA Provider, [place] Living arrangements for the past 2 months: [place]                 DME Arranged: N/A DME Agency: NA       HH Arranged: NA HH Agency: NA         Social Determinants of Health (SDOH) Interventions    Readmission Risk Interventions    06/02/2022   12:02 PM 10/17/2021   10:47 AM  Readmission Risk Prevention Plan  Transportation Screening Complete Complete  PCP or Specialist Appt within 3-5 Days  Complete  Social Work Consult for [place] Planning/Counseling  Complete  Palliative Care Screening  Not Applicable  Medication Review Press photographer) Complete Complete  PCP or Specialist appointment within 3-5 days of discharge Complete   HRI or Home Care Consult Complete   SW Recovery Care/Counseling Consult Complete   Palliative Care Screening Complete   Skilled Nursing Facility Complete

## 2022-06-29 NOTE — TOC Progression Note (Signed)
Transition of Care [facility]) - Progression Note    Patient Details  Name: Calvin [name] MRN: [id number] Date of Birth: 10-Sep-1950  Transition of Care [facility]) CM/SW Contact  Beverly Sessions, RN Phone Number: 06/29/2022, 1:35 PM  Clinical Narrative:     Per MD patient medically ready to start auth for SNF Navi portal is currently down.  I have reached out rep, they are having issues on their end as well and state I need to keep trying the portal   [name] notified   Expected Discharge Plan: [place] Barriers to Discharge: Continued Medical Work up  Expected Discharge Plan and Services Expected Discharge Plan: [place]   Discharge Planning Services: CM Consult Post Acute Care Choice: Resumption of Svcs/PTA Provider, [place] arrangements for the past 2 months: [place]                 DME Arranged: N/A DME Agency: NA       HH Arranged: NA HH Agency: NA         Social Determinants of Health (SDOH) Interventions    Readmission Risk Interventions    06/02/2022   12:02 PM 10/17/2021   10:47 AM  Readmission Risk Prevention Plan  Transportation Screening Complete Complete  PCP or Specialist Appt within 3-5 Days  Complete  Social Work Consult for [place] Planning/Counseling  Complete  Palliative Care Screening  Not Applicable  Medication Review Press photographer) Complete Complete  PCP or Specialist appointment within 3-5 days of discharge Complete   HRI or Home Care Consult Complete   SW Recovery Care/Counseling Consult Complete   Palliative Care Screening Complete   Skilled Nursing Facility Complete

## 2022-06-29 NOTE — Progress Notes (Signed)
Physical Therapy Treatment Patient Details Name: Calvin [name] MRN: [id number] DOB: [birth year] Today's Date: 06/29/2022   History of Present Illness Patient is a 71 year old male with medical history significant of Ogilvie's syndrome, ileus, hypertension, hyperlipidemia, COPD, stroke with mild left-sided weakness, GERD, gout, depression with anxiety, seizure, CAD, MI,  alcohol abuse, CKD stage IIIa, dCHF, colon cancer, thyroid cancer, who presents from his facility with Nausea, vomiting, abdominal distention, abdominal pain for 2 days. Found to have new small bowel obstruction. He developed L side tingling and numbness and facial droop while in the ED, head CT and MRI of brain was negative for acute abnormalitites. s/p laparoscopic transverse loop colostomy creation 10/13.    PT Comments    Pt resting in bed upon PT arrival; agreeable to therapy with some encouragement from his sister (pt hesitant d/t L UE pain).  During session pt mod to max assist with bed mobility; SBA with sitting balance; and max assist to laterally scoot along bed to R (pt declined to stand d/t L UE pain--pt received pain meds prior to session).  Will continue to focus on strengthening, balance, and progressive functional mobility during hospitalization.    Recommendations for follow up therapy are one component of a multi-disciplinary discharge planning process, led by the attending physician.  Recommendations may be updated based on patient status, additional functional criteria and insurance authorization.  Follow Up Recommendations  Skilled nursing-short term rehab (<3 hours/day) Can patient physically be transported by private vehicle: No   Assistance Recommended at Discharge Frequent or constant Supervision/Assistance  Patient can return home with the following Assistance with cooking/housework;[name];Assist for transportation;Direct supervision/assist for medications management;Direct  supervision/assist for financial management;Two people to help with walking and/or transfers;Two people to help with bathing/dressing/bathroom   Equipment Recommendations  None recommended by PT    Recommendations for Other Services OT consult     Precautions / Restrictions Precautions Precautions: Fall Precaution Comments: Seizure precautions Restrictions Weight Bearing Restrictions: No     Mobility  Bed Mobility Overal bed mobility: Needs Assistance Bed Mobility: Supine to Sit, Sit to Supine     Supine to sit: Mod assist, Max assist, HOB elevated Sit to supine: Mod assist, Max assist, HOB elevated   General bed mobility comments: assist for trunk and B LE's; vc's for technique    Transfers Overall transfer level: Needs assistance   Transfers: Bed to chair/wheelchair/BSC            Lateral/Scoot Transfers: Max assist General transfer comment: lateral scoot to R along bed x4 trials (using bed pad under pt to assist) with vc's for overall technique (including shifting weight forward and pushing through B LE's)    Ambulation/Gait Ambulation/Gait assistance:  (pt declined to stand to attempt)                 Stairs             Wheelchair Mobility    Modified Rankin (Stroke Patients Only)       Balance Overall balance assessment: Needs assistance Sitting-balance support: Single extremity supported, Feet supported Sitting balance-Leahy Scale: Fair Sitting balance - Comments: steady static sitting                                    Cognition Arousal/Alertness: Awake/alert Behavior During Therapy: Flat affect   Area of Impairment: Safety/judgement, Awareness, Problem solving  Safety/Judgement: Decreased awareness of safety, Decreased awareness of deficits Awareness: Emergent Problem Solving: Slow processing, Requires verbal cues, Requires tactile cues          Exercises General Exercises  - Lower Extremity Long Arc Quad: AROM, Strengthening, Both, 10 reps, Seated Hip Flexion/Marching: AROM, Strengthening, Both, Seated (x20 reps)    General Comments  Nursing cleared pt for participation in physical therapy.      Pertinent Vitals/Pain Pain Assessment Pain Assessment: Faces Pain Score:  (4/10 at rest; 6/10 with activity) Pain Location: L UE pain Pain Descriptors / Indicators: Tender, Discomfort, Grimacing, Guarding Pain Intervention(s): Limited activity within patient's tolerance, Monitored during session, Premedicated before session, Repositioned HR and O2 on room air stable and WFL throughout treatment session.    Home Living                          Prior Function            PT Goals (current goals can now be found in the care plan section) Acute Rehab PT Goals Patient Stated Goal: none stated PT Goal Formulation: With patient Time For Goal Achievement: 07/03/22 Potential to Achieve Goals: Fair Progress towards PT goals: Progressing toward goals    Frequency    Min 2X/week      PT Plan Current plan remains appropriate    Co-evaluation              AM-PAC PT "6 Clicks" Mobility   Outcome Measure  Help needed turning from your back to your side while in a flat bed without using bedrails?: A Lot Help needed moving from lying on your back to sitting on the side of a flat bed without using bedrails?: A Lot Help needed moving to and from a bed to a chair (including a wheelchair)?: A Lot Help needed standing up from a chair using your arms (e.g., wheelchair or bedside chair)?: A Lot Help needed to walk in hospital room?: Total Help needed climbing 3-5 steps with a railing? : Total 6 Click Score: 10    End of Session   Activity Tolerance: Patient tolerated treatment well Patient left: in bed;with call bell/phone within reach;with bed alarm set;with family/visitor present (B heels floating via pillow support) Nurse Communication:  Precautions PT Visit Diagnosis: Muscle weakness (generalized) (M62.81);Difficulty in walking, not elsewhere classified (R26.2)     Time: 6301-6010 PT Time Calculation (min) (ACUTE ONLY): 31 min  Charges:  $Therapeutic Activity: 23-37 mins                     Leitha Bleak, PT 06/29/22, 4:00 PM

## 2022-06-30 LAB — GLUCOSE, CAPILLARY
Glucose-Capillary: 117 mg/dL — ABNORMAL HIGH (ref 70–99)
Glucose-Capillary: 117 mg/dL — ABNORMAL HIGH (ref 70–99)

## 2022-06-30 MED ORDER — NICOTINE 14 MG/24HR TD PT24: 14.0000 mg | MEDICATED_PATCH | Freq: Every day | TRANSDERMAL | 0 refills | Status: DC

## 2022-06-30 MED ORDER — ALPRAZOLAM 0.5 MG PO TABS: 0.5000 mg | ORAL_TABLET | Freq: Every day | ORAL | 0 refills | Status: DC | PRN

## 2022-06-30 MED ORDER — POLYETHYLENE GLYCOL 3350 17 G PO PACK: 34.0000 g | PACK | Freq: Every day | ORAL | Status: DC

## 2022-06-30 MED ORDER — HYDROCODONE-ACETAMINOPHEN 5-325 MG PO TABS: 0.5000 | ORAL_TABLET | Freq: Three times a day (TID) | ORAL | 0 refills | Status: DC | PRN

## 2022-06-30 NOTE — TOC Progression Note (Signed)
Transition of Care [facility]) - Progression Note    Patient Details  Name: Calvin [name] MRN: [id number] Date of Birth: 08-25-1951  Transition of Care [facility]) CM/SW Contact  Beverly Sessions, RN Phone Number: 06/30/2022, 10:54 AM  Clinical Narrative:    Message sent to [name] and [facility] to confirm if Josem Kaufmann has been started.  Awaiting response    Expected Discharge Plan: [place] Barriers to Discharge: Continued Medical Work up  Expected Discharge Plan and Services Expected Discharge Plan: [place]   Discharge Planning Services: CM Consult Post Acute Care Choice: Resumption of Svcs/PTA Provider, [place] Living arrangements for the past 2 months: [place]                 DME Arranged: N/A DME Agency: NA       HH Arranged: NA HH Agency: NA         Social Determinants of Health (SDOH) Interventions    Readmission Risk Interventions    06/02/2022   12:02 PM 10/17/2021   10:47 AM  Readmission Risk Prevention Plan  Transportation Screening Complete Complete  PCP or Specialist Appt within 3-5 Days  Complete  Social Work Consult for [place] Planning/Counseling  Complete  Palliative Care Screening  Not Applicable  Medication Review Press photographer) Complete Complete  PCP or Specialist appointment within 3-5 days of discharge Complete   HRI or [place] Complete   SW Recovery Care/Counseling Consult Complete   Palliative Care Screening Complete   Skilled Nursing Facility Complete

## 2022-06-30 NOTE — Progress Notes (Signed)
OT Cancellation Note  Patient Details Name: Calvin [name] MRN: [id number] DOB: 06-04-1951   Cancelled Treatment:    Reason Eval/Treat Not Completed: Pain limiting ability to participate;Patient declined, no reason specified. Chart reviewed, upon arrival pt reports gas pain, requests medication - RN notified. Pt refuses mobility or bed level exercises citing pain. Will reattempt as pt able.   Dessie Coma, M.S. OTR/L  06/30/22, 1:59 PM  ascom [phone]

## 2022-06-30 NOTE — TOC Transition Note (Addendum)
Transition of Care [facility]) - CM/SW Discharge Note   Patient Details  Name: EMIGDIO WILDEMAN MRN: [id number] Date of Birth: December 27, 1950  Transition of Care [facility]) CM/SW Contact:  Beverly Sessions, RN Phone Number: 06/30/2022, 2:22 PM   Clinical Narrative:      Per [name] due to the patients insurance plan they will not need to obtain auth before he arrives and can accept today  Patient will DC DG:UYQI Anticipated DC date:.todays  Family notified:Daughter [name]  Transport by: [facility]  Referral made to [facility] with Manufacturing engineer for outpatient palliative   Per MD patient ready for DC to . RN, patient's family, and facility notified of DC. Discharge Summary sent to facility. RN given number for report. DC packet on chart. Ambulance transport requested for patient.  TOC signing off.  Isaias Cowman [facility] [phone]    Barriers to Discharge: Continued Medical Work up   Patient Goals and CMS Choice Patient states their goals for this hospitalization and ongoing recovery are:: Patient unable to state CMS Medicare.gov Compare Post Acute Care list provided to:: Patient Represenative (must comment) Choice offered to / list presented to : Adult Children  Discharge Placement                       Discharge Plan and Services   Discharge Planning Services: CM Consult Post Acute Care Choice: Resumption of Svcs/PTA Provider, [place]          DME Arranged: N/A DME Agency: NA       HH Arranged: NA HH Agency: NA        Social Determinants of Health (SDOH) Interventions     Readmission Risk Interventions    06/02/2022   12:02 PM 10/17/2021   10:47 AM  Readmission Risk Prevention Plan  Transportation Screening Complete Complete  PCP or Specialist Appt within 3-5 Days  Complete  Social Work Consult for [place] Planning/Counseling  Complete  Palliative Care Screening  Not Applicable  Medication Review Press photographer) Complete  Complete  PCP or Specialist appointment within 3-5 days of discharge Complete   HRI or [place] Complete   SW Recovery Care/Counseling Consult Complete   Palliative Care Screening Complete   Skilled Nursing Facility Complete

## 2022-06-30 NOTE — Progress Notes (Signed)
Mobility Specialist - Progress Note   06/30/22 1200  Mobility  Activity Ambulated with assistance in room;Stood at bedside  Level of Assistance Standby assist, set-up cues, supervision of patient - no hands on  Assistive Device Front wheel walker  Distance Ambulated (ft) 10 ft  Range of Motion/Exercises Active  Activity Response Tolerated well  $Mobility charge 1 Mobility     Pt lying in bed upon arrival, utilizing RA. Pt reports feeling better today. Able to complete bed mobility with minA and extra time. Completed x10 STS from elevated bed height with minA progressing to minG. Pt took several steps fwd/lateral with minG. Noted pt does lose RW grip in LUE a few times during session. Once returned to EOB, pt participated in UB therex with focus on LUE. Mildly fatigued with activity. Pt made great progress this date compared to previous sessions. Pt left in bed with alarm set, needs in reach.   Kathee Delton Mobility Specialist 06/30/22, 12:25 PM

## 2022-06-30 NOTE — Progress Notes (Signed)
[facility]) Hospital Liaison Note:   Referral received for outpatient palliative services.   [facility] hospital liaison will follow patient for discharge disposition.   Please call with any questions or concerns.  Thank you for allowing [place] to participate in this patients care.    Kenna Gilbert BSN, RN  [facility] Liaison  [phone]

## 2022-06-30 NOTE — Discharge Summary (Addendum)
Physician Discharge Summary   Patient: Calvin [name] MRN: [id number] DOB: 1951-06-13  Admit date:     05/28/2022  Discharge date: 06/30/22  Discharge Physician: Jennye Boroughs   PCP: Pcp, No   Recommendations at discharge:   Follow-up with the physician in the next home within 3 days of discharge. Follow-up with general surgeon in 1 week Follow-up with Dr. Izora Ribas, neurosurgeon, in 1 month Follow-up with cardiologist for CAD and recent paroxysmal atrial flutter/atrial fibrillation Follow-up with [facility] palliative care team as an outpatient  Discharge Diagnoses: Principal Problem:   Ogilvie syndrome Active Problems:   Acute kidney injury superimposed on CKD ([place])   Paroxysmal atrial flutter (HCC)   Chronic diastolic CHF (congestive heart failure) ([place])   Cervical spinal stenosis   Seizures (HCC)   HTN (hypertension)   Aspiration pneumonia ([place])   CAD (coronary artery disease)   Respiratory distress   HLD (hyperlipidemia)   COPD (chronic obstructive pulmonary disease) ([place])   Tobacco abuse   Depression with anxiety   Acute abdominal pain   Neuropathy   Dysphagia   Headache  Resolved Problems:   * No resolved hospital problems. [facility] Hospital Course:  [name] is a 71 y.o. male with medical history significant of Ogilvie's syndrome, ileus, hypertension, hyperlipidemia, COPD, stroke with mild left-sided weakness, GERD, gout, depression with anxiety, seizure, CAD, MI,  alcohol use disorder, CKD stage IIIa, chronic diastolic CHF, colon cancer, thyroid cancer, who presented from the nursing facility with nausea, vomiting, abdominal distention, abdominal pain for 2 days. Found to have new small bowel obstruction on CT abdomen with transition point in the right lower abdomen suspicious for adhesion. General surgery was consulted and NG tube was placed.   Patient also developed left-sided tingling and numbness and facial droop while in ED. CT head and MRI brain was  negative for any acute intracranial abnormality and do reflect a chronic changes. 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  MRI of the cervical spine showed C3-C4 moderate to severe spinal stenosis, seen by neurosurgery, recommended outpatient follow-up.   9/25: Continued to have significant colonic distention.  NG suction continued. The patient was transferred to the stepdown unit for neostigmine dosing on 05/31/2022 and 06/01/2022.   Patient had a colonoscopy decompression on 9/29 and 10/3, followed with rectal tube. Tube feeding started on 07/03/2022   10/4.  Modified barium swallow showed a significant risk of aspiration.  TPN started after PICC line placed.   10/10.  Patient currently on TPN, GI cannot place PEG tube due to worsening due to abdominal distention.  Patient will need a total colectomy in order to survive.  Dr. Roosevelt Locks spoke with Dr. Doyne Keel from [facility] and he did not believe that he was a good surgical candidate and can touch back with them in a week if not improved.   10/12.  Patient considering major surgical intervention requiring colectomy and ileostomy and feeding tube.     10/13. Dr Dahlia Byes took to the OR for robotic assisted laparoscopic transverse loop colostomy creation.   10/16.  Surgical team cleared for starting liquids.  Need to get speech therapy for a swallow evaluation.   started on dysphagia diet with nectar thick liquids but still high risk for aspiration.   06/20/2022.  Need to see how much she is able to intake.  May end up needing a PEG. 10/18: Discussed with general surgery.  They have signed off at this time.  Will follow peripherally.  Patient remains on TPN.  Attempting to eat dysphagia 1 diet.   10/19: Patient's p.o. intake remains poor.  Unable or unwilling to engage in discussion regarding PEG tube placement.  Current plan of care is to optimize as much as possible and discharge to skilled nursing facility with outpatient palliative  care following.   Eventually, patient's oral intake improved and he was successfully weaned off TPN.  He is tolerating dysphagia 1 diet with nectar thick liquids.  Foley catheter was also removed on 06/29/2022 and he has been able to pass urine without any difficulty.  His condition has improved and he is deemed stable for discharge to SNF.  Discharge plan was discussed with [name], daughter over the phone.            Assessment and Plan:  Ogilvie syndrome Gastroenterology gave neostigmine infusion on 05/31/2022 and on 06/01/22.  Decompressive colonoscopy procedure on 06/02/2022 and on 06/06/2022 and left a rectal tube in. Patient did not do well with barium swallow exam.  Dr. Dahlia Byes took to the operating room for robotic assisted transverse colon diverting colostomy on 06/16/2022 recurrent and recalcitrant Ogilvie's syndrome.  Repeat swallow evaluation had improved on 06/19/2022.  He is tolerating dysphagia 1 diet with nectar thick liquids.  He has been weaned off TPN.   Acute kidney injury superimposed on CKD 3 ([place]) AKI on CKD stage IIIa.  Improved.   Paroxysmal atrial flutter (HCC) Currently rate controlled and in normal sinus rhythm.  Had paroxysmal atrial fibrillation and atrial flutter while he was in the ICU, earlier in the hospital course.  Continue metoprolol.  Defer anticoagulation to the outpatient setting.  Cervical spinal stenosis MRI showing multilevel cervical spondylosis most pronounced at C3-C4 where moderate to severe canal stenosis and severe bilateral foraminal stenosis.  Follow-up with Dr. Izora Ribas as outpatient   Chronic diastolic CHF (congestive heart failure) ([place]) Resume Lasix at discharge   Seizures [facility]) continue Keppra   Aspiration pneumonia ([place]) Completed 5 days of Zosyn on 06/05/2022.   Respiratory distress Respiratory distress on 06/01/2022 with upper airway congestion.  Improved with Decadron and racemic epinephrine infusion.   He is tolerating room  air.   COPD (chronic obstructive pulmonary disease) (HCC) Continue nebulizers     Other comorbidities include hyperlipidemia, CAD, hypertension, depression with anxiety, tobacco use disorder, neuropathy         Pain control - [place] Controlled Substance Reporting System database was reviewed. and patient was instructed, not to drive, operate heavy machinery, perform activities at heights, swimming or participation in water activities or provide baby-sitting services while on Pain, Sleep and Anxiety Medications; until their outpatient Physician has advised to do so again. Also recommended to not to take more than prescribed Pain, Sleep and Anxiety Medications.  Consultants: Gastroenterologist, Education officer, environmental, neurologist, neurosurgeon Procedures performed:  Robotic assisted Laparoscopic loop transverse colostomy on 06/16/2022 Colonoscopy on 06/06/2022 and 06/02/2022  Disposition: Skilled nursing facility Diet recommendation:  Dysphagia type 1 nectar thick Liquid DISCHARGE MEDICATION: Allergies as of 06/30/2022   No Known Allergies      Medication List     STOP taking these medications    B-complex with vitamin C tablet   Biofreeze 4 % Gel Generic drug: Menthol (Topical Analgesic)   Cepacol Sore Throat & Cough 5-7.5 MG Lozg Generic drug: Dextromethorphan-Benzocaine   clindamycin-benzoyl peroxide gel Commonly known as: BENZACLIN   guaiFENesin 600 MG 12 hr tablet Commonly known as: MUCINEX   lidocaine 4 %   Lidocaine HCl 4 %  Crea   metoCLOPramide 5 MG tablet Commonly known as: REGLAN   nystatin powder Commonly known as: MYCOSTATIN/NYSTOP   ondansetron 4 MG disintegrating tablet Commonly known as: ZOFRAN-ODT   promethazine 25 MG/ML injection Commonly known as: PHENERGAN   Vitamin B-Complex Tabs   Zinc Oxide 25 % Pste       TAKE these medications    acetaminophen 325 MG tablet Commonly known as: TYLENOL Take 650 mg by mouth every 6 (six)  hours.   acidophilus Caps capsule Take 1 capsule by mouth daily.   ALPRAZolam 0.5 MG tablet Commonly known as: XANAX Take 1 tablet (0.5 mg total) by mouth daily as needed for anxiety. What changed:  medication strength how much to take when to take this reasons to take this Another medication with the same name was removed. Continue taking this medication, and follow the directions you see here.   alum & mag hydroxide-simeth 200-200-20 MG/5ML suspension Commonly known as: MAALOX/MYLANTA Take 30 mLs by mouth every 12 (twelve) hours as needed for indigestion or heartburn.   aspirin 81 MG chewable tablet Take 81 mg by mouth daily.   BENEFIBER DRINK MIX PO Take 5 g by mouth daily.   bisacodyl 10 MG suppository Commonly known as: DULCOLAX Place 10 mg rectally in the morning.   bisacodyl 5 MG EC tablet Commonly known as: DULCOLAX Take 5 mg by mouth daily.   Calcium 600+D Plus Minerals 600-400 MG-UNIT Tabs Take 1 tablet by mouth 2 (two) times daily.   furosemide 20 MG tablet Commonly known as: LASIX Take 1 tablet (20 mg total) by mouth daily.   gabapentin 100 MG capsule Commonly known as: NEURONTIN Take 100 mg by mouth in the morning. (Take only with morning '800mg'$  dose to equal '900mg'$  total)   gabapentin 400 MG capsule Commonly known as: NEURONTIN Take 2 capsules (800 mg total) by mouth 3 (three) times daily.   HYDROcodone-acetaminophen 5-325 MG tablet Commonly known as: NORCO/VICODIN Take 0.5 tablets by mouth every 8 (eight) hours as needed for moderate pain. What changed:  when to take this reasons to take this   lactulose 10 GM/15ML solution Commonly known as: CHRONULAC Take 20 g by mouth daily.   levETIRAcetam 100 MG/ML solution Commonly known as: KEPPRA Take 1,000 mg by mouth 2 (two) times daily. What changed: Another medication with the same name was removed. Continue taking this medication, and follow the directions you see here.   lisinopril 5 MG  tablet Commonly known as: ZESTRIL Take 1 tablet (5 mg total) by mouth daily.   melatonin 3 MG Tabs tablet Take 6 mg by mouth at bedtime.   metoprolol succinate 25 MG 24 hr tablet Commonly known as: TOPROL-XL Take 25 mg by mouth daily.   nicotine 14 mg/24hr patch Commonly known as: NICODERM CQ - dosed in mg/24 hours Place 1 patch (14 mg total) onto the skin daily. Start taking on: July 01, 2022   nitroGLYCERIN 0.4 MG SL tablet Commonly known as: NITROSTAT Place 0.4 mg under the tongue every 5 (five) minutes as needed for chest pain.   Omega-3 1000 MG Caps Take 1,000 mg by mouth daily.   pantoprazole 20 MG tablet Commonly known as: PROTONIX Take 20 mg by mouth in the morning.   polyethylene glycol 17 g packet Commonly known as: MIRALAX / GLYCOLAX Take 34 g by mouth daily.   senna 8.6 MG Tabs tablet Commonly known as: SENOKOT Take 1 tablet (8.6 mg total) by mouth at bedtime. What changed:  when to take this   sertraline 50 MG tablet Commonly known as: ZOLOFT Take 50 mg by mouth daily.   simvastatin 10 MG tablet Commonly known as: ZOCOR Take 10 mg by mouth at bedtime.   tamsulosin 0.4 MG Caps capsule Commonly known as: FLOMAX Take 0.4 mg by mouth every evening.   tiotropium 18 MCG inhalation capsule Commonly known as: SPIRIVA Place 18 mcg into inhaler and inhale daily.               Discharge Care Instructions  (From admission, onward)           Start     Ordered   06/30/22 0000  Discharge wound care:       Comments: Ostomy care   06/30/22 1217            Contact information for follow-up providers     Edison Simon R, PA-C Follow up on 06/29/2022.   Specialty: Physician Assistant Contact information: [address] [place] [address] [phone]         Sharon Follow up in 1 week(s).   Specialty: General Surgery Contact information: [address] [id number] mc [place] [phone]        [place] [facility]. Schedule an appointment as soon as possible for a visit in 2 week(s).   Specialty: Cardiology Why: paroxysmal atrial flutter/atrial fibrillation, CAD Contact information: [place] [phone] ar [place]        Meade Maw, MD. Schedule an appointment as soon as possible for a visit in 1 month(s).   Specialty: Neurosurgery Why: Cervical spinal stenosis Contact information: [address] [place] [address] [phone]              Contact information for after-discharge care     Destination     [facility] Preferred SNF .   Service: Skilled Nursing Contact information: [place] [phone]                    Discharge Exam: Danley Danker Weights   06/24/22 0500 06/27/22 0420 06/28/22 0500  Weight: 99 kg 99.5 kg 100.7 kg   GEN: NAD SKIN: Warm and dry EYES: EOMI ENT: MMM CV: RRR PULM: CTA B ABD: soft, distended, NT, +BS, +colostomy CNS: AAO x 3, non focal EXT: No edema or tenderness   Condition at discharge: good  The results of significant diagnostics from this hospitalization (including imaging, microbiology, ancillary and laboratory) are listed below for reference.   Imaging Studies: DG Swallowing Func-Speech Pathology  Result Date: 06/22/2022 Table formatting from the original result was not included. Objective Swallowing Evaluation: Type of Study: MBS-Modified Barium Swallow Study  Patient Details Name: HUNG RHINESMITH MRN: [id number] Date of Birth: June 16, 1951 Today's Date: 06/22/2022 Time: SLP Start Time (ACUTE ONLY): 1050 -SLP Stop Time (ACUTE ONLY): 1130 SLP Time Calculation (min) (ACUTE ONLY): 40 min Past Medical History: Past Medical History: Diagnosis Date  Alcohol abuse   drinks on weekend  Anemia   Anxiety   Arthritis   Cancer  ([place])   colon,throat  COPD (chronic obstructive pulmonary disease) ([place])   Coronary artery disease   Depression   Gout   Hypertension   Myocardial infarction ([place])   Neuromuscular disorder ([place])   Seizures ([place])   last 6 months ago  Stroke [facility])   multiple  left side weakness  Tremors of nervous system  Past Surgical History: Past Surgical History: Procedure Laterality Date  CARPAL TUNNEL RELEASE Left 10/19/2015  Procedure: CARPAL TUNNEL RELEASE;  Surgeon: Hessie Knows, MD;  Location: [facility] ORS;  Service: Orthopedics;  Laterality: Left;  COLON SURGERY    COLONOSCOPY WITH PROPOFOL N/A 10/26/2021  Procedure: COLONOSCOPY WITH PROPOFOL;  Surgeon: Lesly Rubenstein, MD;  Location: [facility] ENDOSCOPY;  Service: Endoscopy;  Laterality: N/A;  COLONOSCOPY WITH PROPOFOL N/A 06/02/2022  Procedure: COLONOSCOPY WITH PROPOFOL;  Surgeon: Lesly Rubenstein, MD;  Location: [facility] ENDOSCOPY;  Service: Endoscopy;  Laterality: N/A;  COLONOSCOPY WITH PROPOFOL N/A 06/06/2022  Procedure: COLONOSCOPY WITH PROPOFOL;  Surgeon: Lesly Rubenstein, MD;  Location: [facility] ENDOSCOPY;  Service: Endoscopy;  Laterality: N/A;  JOINT REPLACEMENT    left partial hip   THROAT SURGERY  2013  cancer HPI: Pt is 58 YOM admitted for small bowel obstruction and abdominal distention. PMH includes: CVA w L sided weakness, dCHF, seizures, CAD, HLD, CKD3a, COPD, tobacco abuse, depression, anxiety, and ogilive's syndrome.  Surgery consulted: "abdominal pain, distension, nausea, and emesis thought to be secondary to possible small bowel obstruction also with stable appearance of chronic colonic distension, complicated by significant comorbid disease.".   CT of Abd: Lower chest: Stable small solid pulmonary nodule of the right lower  lobe measuring 5 mm on series 4, image 1. New mild linear nodular  opacity of the left lower lobe.  CXR on 06/05/2022: Enlarged cardiopericardial silhouette with pulmonary vascular  congestion.   Surgery is following for decompressive  colonoscopy now; colostomy completed.  Oropharyngeal phase Dysphagia dx'd: 2 MBSSs completed this admit.  Subjective: pt awake, less wet vocal quality noted. NGT removed; TPN ongoing. Pt verbal and followed directions adequately given Time.  Recommendations for follow up therapy are one component of a multi-disciplinary discharge planning process, led by the attending physician.  Recommendations may be updated based on patient status, additional functional criteria and insurance authorization. Assessment / Plan / Recommendation   06/20/2022  12:00 PM Clinical Impressions SLP Visit Diagnosis Dysphagia, oropharyngeal phase (R13.12)     06/19/2022   3:00 PM Treatment Recommendations Treatment Recommendations Therapy as outlined in treatment plan below     06/19/2022   3:00 PM Prognosis Prognosis for Safe Diet Advancement Guarded Barriers to Reach Goals Cognitive deficits;Language deficits;Time post onset;Severity of deficits;Motivation Barriers/Prognosis Comment deconditioned; extended illness; dysphagia; no established nutrition/hydration for long term currently   06/20/2022  12:00 PM Diet Recommendations Compensations Minimize environmental distractions;Slow rate;Small sips/bites;Lingual sweep for clearance of pocketing;Multiple dry swallows after each bite/sip;Follow solids with liquid;Clear throat intermittently     06/20/2022  12:00 PM Other Recommendations Follow Up Recommendations Skilled nursing-short term rehab (<3 hours/day) Assistance recommended at discharge Intermittent Supervision/Assistance   06/19/2022   3:00 PM Frequency and Duration  Speech Therapy Frequency (ACUTE ONLY) min 1 x/week Treatment Duration 1 week     06/19/2022   3:00 PM Oral Phase Oral Phase Impaired Oral - Nectar Teaspoon Lingual pumping;Lingual/palatal residue;Delayed oral transit;Decreased bolus cohesion;Premature spillage Oral - Nectar Cup Lingual pumping;Lingual/palatal residue;Delayed oral transit;Decreased bolus cohesion;Premature  spillage Oral - Thin Teaspoon Lingual pumping;Lingual/palatal residue;Delayed oral transit;Decreased bolus cohesion;Premature spillage Oral - Thin Cup Lingual pumping;Lingual/palatal residue;Delayed oral transit;Decreased bolus cohesion;Premature spillage Oral - Puree Lingual pumping;Holding of bolus;Lingual/palatal residue;Delayed oral transit;Decreased bolus cohesion Oral - Mech Soft Lingual pumping;Holding of bolus;Lingual/palatal residue;Piecemeal swallowing;Delayed oral transit;Decreased bolus cohesion Oral Phase - Comment significantly increased oral phase time w/ increased textured boluses    06/19/2022   3:00  PM Pharyngeal Phase Pharyngeal Phase Impaired Pharyngeal- Nectar Cup Reduced pharyngeal peristalsis;Reduced epiglottic inversion;Delayed swallow initiation-vallecula;Reduced laryngeal elevation;Reduced anterior laryngeal mobility;Reduced airway/laryngeal closure;Reduced tongue base retraction;Pharyngeal residue - posterior pharnyx;Pharyngeal residue - valleculae;Pharyngeal residue - pyriform;Lateral channel residue;Compensatory strategies attempted (with notebox);Penetration/Apiration after swallow Pharyngeal- Thin Cup Delayed swallow initiation-pyriform sinuses;Reduced pharyngeal peristalsis;Reduced epiglottic inversion;Reduced anterior laryngeal mobility;Reduced laryngeal elevation;Reduced airway/laryngeal closure;Reduced tongue base retraction;Penetration/Aspiration during swallow;Pharyngeal residue - valleculae;Pharyngeal residue - pyriform;Pharyngeal residue - posterior pharnyx;Lateral channel residue;Compensatory strategies attempted (with notebox) Pharyngeal- Puree Delayed swallow initiation-vallecula;Reduced pharyngeal peristalsis;Reduced epiglottic inversion;Reduced anterior laryngeal mobility;Reduced laryngeal elevation;Reduced tongue base retraction;Pharyngeal residue - valleculae;Pharyngeal residue - pyriform;Pharyngeal residue - posterior pharnyx;Lateral channel residue;Compensatory  strategies attempted (with notebox);Penetration/Apiration after swallow Pharyngeal- Mechanical Soft Delayed swallow initiation-vallecula;Reduced pharyngeal peristalsis;Reduced epiglottic inversion;Reduced anterior laryngeal mobility;Reduced laryngeal elevation;Reduced tongue base retraction;Pharyngeal residue - valleculae;Pharyngeal residue - pyriform;Pharyngeal residue - posterior pharnyx;Lateral channel residue;Compensatory strategies attempted (with notebox);Penetration/Apiration after swallow Pharyngeal Comment MOD amount of residue remained in the pharynx post initial swallow; a f/u, Dry swallow (CUED) aided in reducing. Laryngeal Penetration of resiude noted b/t trials -- trace-min    06/19/2022   3:00 PM Cervical Esophageal Phase  Cervical Esophageal Phase Impaired Happi Overton 06/22/2022, 3:57 PM                     DG Swallowing Func-Speech Pathology  Result Date: 06/22/2022 Table formatting from the original result was not included. Objective Swallowing Evaluation: Type of Study: MBS-Modified Barium Swallow Study  Patient Details Name: ELLARD NAN MRN: [id number] Date of Birth: [birth year] Today's Date: 06/22/2022 Time: SLP Start Time (ACUTE ONLY): 23 -SLP Stop Time (ACUTE ONLY): 1130 SLP Time Calculation (min) (ACUTE ONLY): 40 min Past Medical History: Past Medical History: Diagnosis Date  Alcohol abuse   drinks on weekend  Anemia   Anxiety   Arthritis   Cancer ([place])   colon,throat  COPD (chronic obstructive pulmonary disease) ([place])   Coronary artery disease   Depression   Gout   Hypertension   Myocardial infarction ([place])   Neuromuscular disorder ([place])   Seizures ([place])   last 6 months ago  Stroke [facility])   multiple  left side weakness  Tremors of nervous system  Past Surgical History: Past Surgical History: Procedure Laterality Date  CARPAL TUNNEL RELEASE Left 10/19/2015  Procedure: CARPAL TUNNEL RELEASE;  Surgeon: Hessie Knows, MD;  Location: [facility] ORS;  Service: Orthopedics;  Laterality: Left;   COLON SURGERY    COLONOSCOPY WITH PROPOFOL N/A 10/26/2021  Procedure: COLONOSCOPY WITH PROPOFOL;  Surgeon: Lesly Rubenstein, MD;  Location: [facility] END[facility]COPY;  Service: Endoscopy;  Laterality: N/A;  COLONOSCOPY WITH PROPOFOL N/A 06/02/2022  Procedure: COLONOSCOPY WITH PROPOFOL;  Surgeon: Lesly Rubenstein, MD;  Location: [facility] ENDOSCOPY;  Service: Endoscopy;  Laterality: N/A;  COLONOSCOPY WITH PROPOFOL N/A 06/06/2022  Procedure: COLONOSCOPY WITH PROPOFOL;  Surgeon: Lesly Rubenstein, MD;  Location: [facility] ENDOSCOPY;  Service: Endoscopy;  Laterality: N/A;  JOINT REPLACEMENT    left partial hip   THROAT SURGERY  2013  cancer HPI: Pt is 26 YOM admitted for small bowel obstruction and abdominal distention. PMH includes: CVA w L sided weakness, dCHF, seizures, CAD, HLD, CKD3a, COPD, tobacco abuse, depression, anxiety, and ogilive's syndrome.  Surgery consulted: "abdominal pain, distension, nausea, and emesis thought to be secondary to possible small bowel obstruction also with stable appearance of chronic colonic distension, complicated by significant comorbid disease.".   CT of Abd: Lower chest: Stable small solid pulmonary nodule of the right lower  lobe measuring  5 mm on series 4, image 1. New mild linear nodular  opacity of the left lower lobe.  CXR on 06/05/2022: Enlarged cardiopericardial silhouette with pulmonary vascular  congestion.   Surgery is following for decompressive colonoscopy now; colostomy completed.  Oropharyngeal phase Dysphagia dx'd: 2 MBSSs completed this admit.  Subjective: pt awake, less wet vocal quality noted. NGT removed; TPN ongoing. Pt verbal and followed directions adequately given Time.  Recommendations for follow up therapy are one component of a multi-disciplinary discharge planning process, led by the attending physician.  Recommendations may be updated based on patient status, additional functional criteria and insurance authorization. Assessment / Plan / Recommendation   06/20/2022   12:00 PM Clinical Impressions SLP Visit Diagnosis Dysphagia, oropharyngeal phase (R13.12)     06/19/2022   3:00 PM Treatment Recommendations Treatment Recommendations Therapy as outlined in treatment plan below     06/19/2022   3:00 PM Prognosis Prognosis for Safe Diet Advancement Guarded Barriers to Reach Goals Cognitive deficits;Language deficits;Time post onset;Severity of deficits;Motivation Barriers/Prognosis Comment deconditioned; extended illness; dysphagia; no established nutrition/hydration for long term currently   06/20/2022  12:00 PM Diet Recommendations Compensations Minimize environmental distractions;Slow rate;Small sips/bites;Lingual sweep for clearance of pocketing;Multiple dry swallows after each bite/sip;Follow solids with liquid;Clear throat intermittently     06/20/2022  12:00 PM Other Recommendations Follow Up Recommendations Skilled nursing-short term rehab (<3 hours/day) Assistance recommended at discharge Intermittent Supervision/Assistance   06/19/2022   3:00 PM Frequency and Duration  Speech Therapy Frequency (ACUTE ONLY) min 1 x/week Treatment Duration 1 week     06/19/2022   3:00 PM Oral Phase Oral Phase Impaired Oral - Nectar Teaspoon Lingual pumping;Lingual/palatal residue;Delayed oral transit;Decreased bolus cohesion;Premature spillage Oral - Nectar Cup Lingual pumping;Lingual/palatal residue;Delayed oral transit;Decreased bolus cohesion;Premature spillage Oral - Thin Teaspoon Lingual pumping;Lingual/palatal residue;Delayed oral transit;Decreased bolus cohesion;Premature spillage Oral - Thin Cup Lingual pumping;Lingual/palatal residue;Delayed oral transit;Decreased bolus cohesion;Premature spillage Oral - Puree Lingual pumping;Holding of bolus;Lingual/palatal residue;Delayed oral transit;Decreased bolus cohesion Oral - Mech Soft Lingual pumping;Holding of bolus;Lingual/palatal residue;Piecemeal swallowing;Delayed oral transit;Decreased bolus cohesion Oral Phase - Comment significantly  increased oral phase time w/ increased textured boluses    06/19/2022   3:00 PM Pharyngeal Phase Pharyngeal Phase Impaired Pharyngeal- Nectar Cup Reduced pharyngeal peristalsis;Reduced epiglottic inversion;Delayed swallow initiation-vallecula;Reduced laryngeal elevation;Reduced anterior laryngeal mobility;Reduced airway/laryngeal closure;Reduced tongue base retraction;Pharyngeal residue - posterior pharnyx;Pharyngeal residue - valleculae;Pharyngeal residue - pyriform;Lateral channel residue;Compensatory strategies attempted (with notebox);Penetration/Apiration after swallow Pharyngeal- Thin Cup Delayed swallow initiation-pyriform sinuses;Reduced pharyngeal peristalsis;Reduced epiglottic inversion;Reduced anterior laryngeal mobility;Reduced laryngeal elevation;Reduced airway/laryngeal closure;Reduced tongue base retraction;Penetration/Aspiration during swallow;Pharyngeal residue - valleculae;Pharyngeal residue - pyriform;Pharyngeal residue - posterior pharnyx;Lateral channel residue;Compensatory strategies attempted (with notebox) Pharyngeal- Puree Delayed swallow initiation-vallecula;Reduced pharyngeal peristalsis;Reduced epiglottic inversion;Reduced anterior laryngeal mobility;Reduced laryngeal elevation;Reduced tongue base retraction;Pharyngeal residue - valleculae;Pharyngeal residue - pyriform;Pharyngeal residue - posterior pharnyx;Lateral channel residue;Compensatory strategies attempted (with notebox);Penetration/Apiration after swallow Pharyngeal- Mechanical Soft Delayed swallow initiation-vallecula;Reduced pharyngeal peristalsis;Reduced epiglottic inversion;Reduced anterior laryngeal mobility;Reduced laryngeal elevation;Reduced tongue base retraction;Pharyngeal residue - valleculae;Pharyngeal residue - pyriform;Pharyngeal residue - posterior pharnyx;Lateral channel residue;Compensatory strategies attempted (with notebox);Penetration/Apiration after swallow Pharyngeal Comment MOD amount of residue remained in  the pharynx post initial swallow; a f/u, Dry swallow (CUED) aided in reducing. Laryngeal Penetration of resiude noted b/t trials -- trace-min    06/19/2022   3:00 PM Cervical Esophageal Phase  Cervical Esophageal Phase Impaired Happi Overton 06/22/2022, 3:57 PM  DG ABD ACUTE 2+V W 1V CHEST  Result Date: 06/19/2022 CLINICAL DATA:  Ogilvie syndrome EXAM: DG ABDOMEN ACUTE WITH 1 VIEW CHEST COMPARISON:  06/15/2022 chest and abdomen radiograph. FINDINGS: Right PICC terminates at the cavoatrial junction. Stable cardiomediastinal silhouette with top-normal heart size. No pneumothorax. No pleural effusion. No pulmonary edema. Chronic mild elevation of the right hemidiaphragm. Stable mild right basilar atelectasis. Oral contrast is present throughout the large bowel and rectum. Moderate colonic gas with transverse colon diameter 8.8 cm, unchanged. No dilated small bowel loops. No evidence of pneumatosis or pneumoperitoneum. Mild thoracolumbar spondylosis. IMPRESSION: 1. Nonobstructive bowel gas pattern. Persistent retained oral contrast throughout the colon and rectum. Moderate gaseous distention of the colon is unchanged. 2. Stable mild right basilar atelectasis. Electronically Signed   By: Ilona Sorrel M.D.   On: 06/19/2022 08:13   DG ABD ACUTE 2+V W 1V CHEST  Result Date: 06/15/2022 CLINICAL DATA:  Ogilvie syndrome. EXAM: DG ABDOMEN ACUTE WITH 1 VIEW CHEST COMPARISON:  KUB 06/11/2022 and CT abdomen and pelvis 06/12/2022; AP chest 06/05/2022 FINDINGS: Right upper extremity PICC tip overlies the central superior vena cava, new from prior. The lungs are clear. Cardiac silhouette is again mildly enlarged. Mediastinal contours are within normal limits. No pleural effusion or pneumothorax. Oral contrast is seen within the cecum, ascending colon, and proximal transverse colon. Interval increase in oral contrast within the mid to distal descending colon, which appears collapsed. There is again a  catheter from rectal approach coursing through the rectum, descending colon, and transverse colon with the tip overlying the proximal to mid transverse colon. There is slight improvement in the moderate distention of the ascending and transverse colon. No dilated loops of small bowel are seen. No portal venous gas or pneumatosis. No subdiaphragmatic free air is seen on upright view. Status post total left hip arthroplasty. IMPRESSION: 1. Compared to 06/05/2022, new right upper extremity PICC tip in appropriate position. 2. Mild progression of some oral contrast within the more distal aspect of the descending colon. Again this oral contrast likely was administered for swallowing function study 06/07/2022. 3. Slight improvement in moderate distention of the ascending and transverse colon. No evidence of bowel obstruction. Electronically Signed   By: Yvonne Kendall M.D.   On: 06/15/2022 08:26   CT ABDOMEN PELVIS W CONTRAST  Result Date: 06/12/2022 CLINICAL DATA:  Abdominal pain EXAM: CT ABDOMEN AND PELVIS WITH CONTRAST TECHNIQUE: Multidetector CT imaging of the abdomen and pelvis was performed using the standard protocol following bolus administration of intravenous contrast. RADIATION DOSE REDUCTION: This exam was performed according to the departmental dose-optimization program which includes automated exposure control, adjustment of the mA and/or kV according to patient size and/or use of iterative reconstruction technique. CONTRAST:  121m OMNIPAQUE IOHEXOL 300 MG/ML  SOLN COMPARISON:  05/31/2022 FINDINGS: Lower chest: Small linear patchy infiltrates are seen in both lower lung fields, more so on the right side with no significant interval change. In image 2 of series 5, there is a 4 mm nodular density in right lower lobe with no significant interval change. Coronary artery calcifications are seen. Hepatobiliary: There are few small calcifications in liver. There is no dilation of bile ducts gallbladder stones  are seen. There is no wall thickening in gallbladder. Pancreas: No focal abnormalities are seen. Spleen: Unremarkable. Adrenals/Urinary Tract: There is 2 cm nodule in right adrenal. There is 2.4 cm nodule in left adrenal. These nodules appear stable. These nodules have been noted in multiple previous studies dating as  far back as 05/27/2011. No follow-up is recommended. Lobulations are seen in the margins of both kidneys. There is no hydronephrosis. There are no renal or ureteral stones. There are possible small cysts in the kidneys. Ureters are not dilated. Evaluation of inferior aspect of the urinary bladder is limited by severe beam hardening artifacts. Stomach/Bowel: Stomach is unremarkable. Small bowel loops are not dilated. Appendix is not dilated. There is moderate gaseous distention of ascending and transverse colon. A catheter has been placed through the rectum with its tip in the region of the hepatic flexure. There is no significant wall thickening in colon. Vascular/Lymphatic: Scattered arterial calcifications are seen. No new significant lymphadenopathy is seen. Reproductive: Evaluation of prostate is limited by beam hardening artifacts. Other: There is no ascites or pneumoperitoneum. Musculoskeletal: There is previous left hip arthroplasty. No acute findings are seen. There is decrease in height of body of T7 vertebra. Schmorl's node is seen in the upper endplate of body of U27 vertebra. Similar findings were seen in the previous study. IMPRESSION: There is no evidence of intestinal obstruction or pneumoperitoneum. There is no hydronephrosis. Appendix is not dilated. There is moderate gaseous distention of ascending and transverse colon. Tip of catheter introduced through the rectum is seen in the region of hepatic flexure, possibly decompression catheter. 4 mm nodule in right lower lung field has not changed. Gallbladder stones. Bilateral adrenal nodules appear stable. There are linear patchy  infiltrates in both lower lung fields suggesting atelectasis/pneumonia. Coronary artery calcifications are seen. Other findings as described in the body of the report. Electronically Signed   By: Elmer Picker M.D.   On: 06/12/2022 15:05   DG Abd 1 View  Result Date: 06/11/2022 CLINICAL DATA:  Abdominal pain EXAM: ABDOMEN - 1 VIEW COMPARISON:  06/06/2022 FINDINGS: There is previous placement of catheter through the rectum with its tip in proximal course of transverse colon. There is moderate gaseous distention of transverse colon measuring up to 9.8 cm in diameter. There is interval increase in gaseous distention of colon. There is no significant small bowel dilation. Stomach is not distended. There is interval removal of NG tube. There is contrast in the colon and rectum residual from barium swallow examination done on 06/07/2022. there is left hip arthroplasty. IMPRESSION: There is a decompression catheter introduced through the rectum with its tip in the proximal transverse colon which has not changed in position. There is gaseous distention of transverse and proximal descending colon with interval worsening. Electronically Signed   By: Elmer Picker M.D.   On: 06/11/2022 14:43   DG Abd 1 View  Result Date: 06/06/2022 CLINICAL DATA:  Pseudo-obstruction of colon EXAM: ABDOMEN - 1 VIEW COMPARISON:  Prior abdominal radiograph dated June 05, 2022 FINDINGS: Interval placement of colonic decompression tube with decreased colonic gaseous distention. Measures up to 8.4 cm, previously 10.5 when measured at similar location. Gastric decompression tube with tip in the proximal stomach. Prior left total hip arthroplasty. No acute osseous abnormality. IMPRESSION: Interval placement of colonic decompression tube with decreased colonic gaseous distention. Electronically Signed   By: Yetta Glassman M.D.   On: 06/06/2022 16:22   [place] EKG SITE RITE  Result Date: 06/06/2022 If Site Rite image not attached,  placement could not be confirmed due to current cardiac rhythm.  DG Abd 1 View  Result Date: 06/05/2022 CLINICAL DATA:  NG tube adjustment EXAM: ABDOMEN - 1 VIEW COMPARISON:  Abdominal radiograph dated June 04, 2022 FINDINGS: No appreciable change in position of NG  tube, tip is in the stomach with side port just distal to the GE junction. Increased gaseous distention of the colon, measures up to 12.5 cm, previously 8.7 at similar location. Visualized left lung bases clear. Prior left total hip arthroplasty. IMPRESSION: 1. No appreciable change in position of NG tube, tip is in the stomach with side port just distal to the GE junction. 2. Worsened gaseous distention of the colon. Electronically Signed   By: Yetta Glassman M.D.   On: 06/05/2022 15:50   DG Chest 1 View  Result Date: 06/05/2022 CLINICAL DATA:  Fluid overload. EXAM: CHEST  1 VIEW COMPARISON:  05/24/2022 FINDINGS: 0631 hours. Low volume film. The cardio pericardial silhouette is enlarged. There is pulmonary vascular congestion without overt pulmonary edema. The NG tube passes into the stomach although the distal tip position is not included on the film. Telemetry leads overlie the chest. IMPRESSION: Enlarged cardiopericardial silhouette with pulmonary vascular congestion. Electronically Signed   By: Misty Stanley M.D.   On: 06/05/2022 06:38   DG Abd 2 Views  Result Date: 06/04/2022 CLINICAL DATA:  Abdominal distention. History of Ogilvie's syndrome. Patient presents with nausea, vomiting, abdominal pain for 2 days. EXAM: ABDOMEN - 2 VIEW COMPARISON:  Abdominal radiographs 06/03/2022 FINDINGS: Nasogastric tube tip projects at the expected location of the distal gastric body. Loops of dilated gas-filled colon are seen in the upper abdomen, measuring up to 1 cm in diameter. No free intraperitoneal air. Left hip prosthesis with heterotopic ossification adjacent to the hip joint. IMPRESSION: Persistent gaseous dilation of the colon, though  mildly improved compared to 06/02/2022. Enteric tube tip projects at the expected location of the distal gastric body. Electronically Signed   By: Ileana Roup M.D.   On: 06/04/2022 16:55   DG Abd 1 View  Result Date: 06/03/2022 CLINICAL DATA:  Abdominal distension. EXAM: ABDOMEN - 1 VIEW COMPARISON:  06/02/2022. FINDINGS: Colonic distension noted on the previous day's exam has significantly improved. There is currently only mild left mid to upper quadrant colonic dilation. Nasogastric tube tip projects in the proximal stomach. IMPRESSION: 1. Significant interval improvement in colonic distension, which has mostly resolved. Electronically Signed   By: Lajean Manes M.D.   On: 06/03/2022 08:53   DG Abd 1 View  Result Date: 06/02/2022 CLINICAL DATA:  Pseudo-obstruction of colon per ordering notes. Patient complaining of generalized abdominal pain. EXAM: ABDOMEN - 1 VIEW COMPARISON:  None Available. FINDINGS: Nasogastric tube with the tip projecting over the stomach. Severe dilatation of the colon as can be seen with pseudo-obstruction/colonic ileus. No evidence of pneumoperitoneum, portal venous gas or pneumatosis. No pathologic calcifications along the expected course of the ureters. No acute osseous abnormality. IMPRESSION: Severe dilatation of the colon as can be seen with pseudo-obstruction/colonic ileus. Nasogastric tube with the tip projecting over the stomach. Electronically Signed   By: Kathreen Devoid M.D.   On: 06/02/2022 09:46   DG Chest Port 1 View  Result Date: 06/01/2022 CLINICAL DATA:  Provided history: Shortness of breath. Audible wheezing on exam. EXAM: PORTABLE CHEST 1 VIEW COMPARISON:  Prior chest radiographs 05/31/2022 and earlier. FINDINGS: An enteric tube passes below the level of the left hemidiaphragm with tip excluded from the field of view. Shallow inspiration radiograph. The cardiomediastinal silhouette is unchanged. No appreciable airspace consolidation or pulmonary edema. No  evidence of pleural effusion or pneumothorax. Persistent gaseous distension of partially imaged bowel loops within the upper abdomen. IMPRESSION: Shallow inspiration radiograph. No evidence of acute cardiopulmonary abnormality. Persistent  gaseous distension of partially imaged bowel loops within the upper abdomen. Electronically Signed   By: Kellie Simmering D.O.   On: 06/01/2022 16:43   ECHOCARDIOGRAM COMPLETE  Result Date: 06/01/2022    ECHOCARDIOGRAM REPORT   Patient Name:   JAXN CHIQUITO Date of Exam: 05/31/2022 Medical Rec #:  [id number]      Height:       71.0 in Accession #:    [id number]     Weight:       211.6 lb Date of Birth:  03-12-1951      BSA:          2.160 m Patient Age:    89 years       BP:           151/92 mmHg Patient Gender: M              HR:           115 bpm. Exam Location:  [facility] Procedure: 2D Echo, Cardiac Doppler, Color Doppler and Intracardiac            Opacification Agent Indications:     I48.92 Atrial Flutter  History:         Patient has prior history of Echocardiogram examinations, most                  recent 08/09/2019. CAD and Previous Myocardial Infarction, COPD                  and Stroke; Risk Factors:Hypertension.  Sonographer:     Cresenciano Lick RDCS Referring Phys:  [id number] Loletha Grayer Diagnosing Phys: Ida Rogue MD  Sonographer Comments: Technically difficult study due to poor echo windows. IMPRESSIONS  1. Left ventricular ejection fraction, by estimation, is 50 to 55%. The left ventricle has low normal function. The left ventricle has no regional wall motion abnormalities. There is moderate left ventricular hypertrophy. Left ventricular diastolic parameters are indeterminate.  2. Right ventricular systolic function is normal. The right ventricular size is normal.  3. The mitral valve is normal in structure. No evidence of mitral valve regurgitation. No evidence of mitral stenosis.  4. The aortic valve has an indeterminant number of cusps. Aortic valve  regurgitation is mild. No aortic stenosis is present.  5. There is moderate dilatation of the aortic root, measuring 45 mm. There is mild dilatation of the ascending aorta, measuring 41 mm.  6. The inferior vena cava is normal in size with greater than 50% respiratory variability, suggesting right atrial pressure of 3 mmHg. FINDINGS  Left Ventricle: Left ventricular ejection fraction, by estimation, is 50 to 55%. The left ventricle has low normal function. The left ventricle has no regional wall motion abnormalities. Definity contrast agent was given IV to delineate the left ventricular endocardial borders. The left ventricular internal cavity size was normal in size. There is moderate left ventricular hypertrophy. Left ventricular diastolic parameters are indeterminate. Right Ventricle: The right ventricular size is normal. No increase in right ventricular wall thickness. Right ventricular systolic function is normal. Left Atrium: Left atrial size was normal in size. Right Atrium: Right atrial size was normal in size. Pericardium: There is no evidence of pericardial effusion. Mitral Valve: The mitral valve is normal in structure. No evidence of mitral valve regurgitation. No evidence of mitral valve stenosis. Tricuspid Valve: The tricuspid valve is normal in structure. Tricuspid valve regurgitation is mild . No evidence of tricuspid stenosis. Aortic Valve: The aortic valve has an indeterminant  number of cusps. Aortic valve regurgitation is mild. No aortic stenosis is present. Pulmonic Valve: The pulmonic valve was normal in structure. Pulmonic valve regurgitation is not visualized. No evidence of pulmonic stenosis. Aorta: The aortic root is normal in size and structure. There is moderate dilatation of the aortic root, measuring 45 mm. There is mild dilatation of the ascending aorta, measuring 41 mm. Venous: The inferior vena cava is normal in size with greater than 50% respiratory variability, suggesting right  atrial pressure of 3 mmHg. IAS/Shunts: No atrial level shunt detected by color flow Doppler.  LEFT VENTRICLE PLAX 2D LVIDd:         3.30 cm LVIDs:         2.70 cm LV PW:         1.50 cm LV IVS:        1.50 cm LVOT diam:     2.30 cm LV SV:         33 LV SV Index:   15 LVOT Area:     4.15 cm  LEFT ATRIUM           Index LA diam:      3.90 cm 1.81 cm/m LA Vol (A4C): 29.0 ml 13.43 ml/m  AORTIC VALVE LVOT Vmax:   57.10 cm/s LVOT Vmean:  41.700 cm/s LVOT VTI:    0.080 m  AORTA Ao Root diam: 4.50 cm Ao Asc diam:  4.10 cm  SHUNTS Systemic VTI:  0.08 m Systemic Diam: 2.30 cm Ida Rogue MD Electronically signed by Ida Rogue MD Signature Date/Time: 06/01/2022/11:25:09 AM    Final    CT ABDOMEN PELVIS W CONTRAST  Result Date: 05/31/2022 CLINICAL DATA:  Bowel obstruction suspected EXAM: CT ABDOMEN AND PELVIS WITH CONTRAST TECHNIQUE: Multidetector CT imaging of the abdomen and pelvis was performed using the standard protocol following bolus administration of intravenous contrast. RADIATION DOSE REDUCTION: This exam was performed according to the departmental dose-optimization program which includes automated exposure control, adjustment of the mA and/or kV according to patient size and/or use of iterative reconstruction technique. CONTRAST:  18m OMNIPAQUE IOHEXOL 300 MG/ML  SOLN COMPARISON:  Multiple priors, most recent CT abdomen and pelvis dated May 28, 2022 FINDINGS: Lower chest: Stable small solid pulmonary nodule of the right lower lobe measuring 5 mm on series 4, image 1. New mild linear nodular opacity of the left lower lobe. Hepatobiliary: No focal liver abnormality is seen. Gallstones with no evidence of gallbladder wall thickening. No biliary ductal dilation. Pancreas: Unremarkable. No pancreatic ductal dilatation or surrounding inflammatory changes. Spleen: Normal in size without focal abnormality. Adrenals/Urinary Tract: Stable bilateral adrenal gland masses are consistent with benign adenomas,  no follow-up imaging is recommended. No hydronephrosis or nephrolithiasis. Severe bilateral renal parenchymal scarring. Small scattered low-attenuation renal lesions which are too small to completely characterize, no specific follow-up imaging is recommended. Bladder is unremarkable. Stomach/Bowel: Normal appearance of the stomach. NG tube with tip in the midportion of the stomach. No dilated loops of small bowel seen on today's exam. Suture line of the sigmoid colon similar gaseous distention of the colon. Vascular/Lymphatic: Aortic atherosclerosis. No enlarged abdominal or pelvic lymph nodes. Reproductive: Prostate is unremarkable. Other: No abdominal wall hernia or abnormality. No abdominopelvic ascites. Musculoskeletal: Total left hip replacement. No aggressive appearing osseous lesions. IMPRESSION: 1. No evidence of small bowel obstruction with normal caliber small bowel loops seen on today's exam. 2. Persistent gaseous distention of the colon, likely due to pseudo-obstruction. 3. New mild linear nodular opacity  of the left lower lobe, likely due to aspiration or infection. 4. Cholelithiasis with no evidence of acute cholecystitis. 5. Solid 5 mm pulmonary nodule of the right lower lobe, stable when compared with prior exams dating back to October 16, 2021. No follow-up needed if patient is low-risk.This recommendation follows the consensus statement: Guidelines for Management of Incidental Pulmonary Nodules Detected on CT Images: From the Fleischner Society 2017; Radiology 2017; 284:228-243. 6.  Aortic Atherosclerosis (ICD10-I70.0). Electronically Signed   By: Yetta Glassman M.D.   On: 05/31/2022 14:44    Microbiology: Results for orders placed or performed during the hospital encounter of 05/28/22  Resp Panel by RT-PCR (Flu A&B, Covid) Anterior Nasal Swab     Status: None   Collection Time: 05/28/22  3:04 PM   Specimen: Anterior Nasal Swab  Result Value Ref Range Status   SARS Coronavirus 2 by RT  PCR NEGATIVE NEGATIVE Final    Comment: (NOTE) SARS-CoV-2 target nucleic acids are NOT DETECTED.  The SARS-CoV-2 RNA is generally detectable in upper respiratory specimens during the acute phase of infection. The lowest concentration of SARS-CoV-2 viral copies this assay can detect is 138 copies/mL. A negative result does not preclude SARS-Cov-2 infection and should not be used as the sole basis for treatment or other patient management decisions. A negative result may occur with  improper specimen collection/handling, submission of specimen other than nasopharyngeal swab, presence of viral mutation(s) within the areas targeted by this assay, and inadequate number of viral copies(<138 copies/mL). A negative result must be combined with clinical observations, patient history, and epidemiological information. The expected result is Negative.  Fact Sheet for Patients:  EntrepreneurPulse.com.au  Fact Sheet for Healthcare Providers:  IncredibleEmployment.be  This test is no t yet approved or cleared by the [place] FDA and  has been authorized for detection and/or diagnosis of SARS-CoV-2 by FDA under an Emergency Use Authorization (EUA). This EUA will remain  in effect (meaning this test can be used) for the duration of the COVID-19 declaration under Section 564(b)(1) of the Act, 21 U.S.C.section 360bbb-3(b)(1), unless the authorization is terminated  or revoked sooner.       Influenza A by PCR NEGATIVE NEGATIVE Final   Influenza B by PCR NEGATIVE NEGATIVE Final    Comment: (NOTE) The Xpert Xpress SARS-CoV-2/FLU/RSV plus assay is intended as an aid in the diagnosis of influenza from Nasopharyngeal swab specimens and should not be used as a sole basis for treatment. Nasal washings and aspirates are unacceptable for Xpert Xpress SARS-CoV-2/FLU/RSV testing.  Fact Sheet for Patients: EntrepreneurPulse.com.au  Fact Sheet for  Healthcare Providers: IncredibleEmployment.be  This test is not yet approved or cleared by the [place] FDA and has been authorized for detection and/or diagnosis of SARS-CoV-2 by FDA under an Emergency Use Authorization (EUA). This EUA will remain in effect (meaning this test can be used) for the duration of the COVID-19 declaration under Section 564(b)(1) of the Act, 21 U.S.C. section 360bbb-3(b)(1), unless the authorization is terminated or revoked.  Performed at [facility], [address] [place], [place] [address]   MRSA Next Gen by PCR, Nasal     Status: None   Collection Time: 05/31/22  3:42 PM   Specimen: Nasal Mucosa; Nasal Swab  Result Value Ref Range Status   MRSA by PCR Next Gen NOT DETECTED NOT DETECTED Final    Comment: (NOTE) The GeneXpert MRSA Assay (FDA approved for NASAL specimens only), is one component of a comprehensive MRSA colonization surveillance  program. It is not intended to diagnose MRSA infection nor to guide or monitor treatment for MRSA infections. Test performance is not FDA approved in patients less than 31 years old. Performed at [facility], [address] [place], [place] [address]     Labs: CBC: Recent Labs  Lab 06/27/22 0618  WBC 8.5  HGB 10.0*  HCT 32.4*  MCV 97.9  PLT 891   Basic Metabolic Panel: Recent Labs  Lab 06/24/22 0619 06/26/22 0520 06/28/22 0615 06/29/22 0545  NA 137 137 138 138  K 4.7 4.6 4.4 4.7  CL 104 105 105 104  CO2 '26 25 25 27  '$ GLUCOSE 133* 127* 113* 96  BUN 32* 40* 48* 39*  CREATININE 1.07 1.28* 1.27* 1.20  CALCIUM 8.8* 9.0 8.9 8.5*  MG  --  1.7 1.8 2.0  PHOS  --  4.6 4.5 4.1   Liver Function Tests: Recent Labs  Lab 06/26/22 0520 06/29/22 0545  AST 68* 55*  ALT 146* 131*  ALKPHOS 119 135*  BILITOT 0.4 0.6  PROT 6.6 6.5  ALBUMIN 2.9* 2.8*   CBG: Recent Labs  Lab 06/29/22 1129 06/29/22 1728 06/29/22 2248 06/30/22 0551 06/30/22 1138   GLUCAP 105* 113* 108* 117* 117*    Discharge time spent: greater than 30 minutes.  Signed: Jennye Boroughs, MD [facility] 06/30/2022

## 2022-06-30 NOTE — Progress Notes (Signed)
Calvin [name] to be D/C'd Skilled nursing facility per MD order.  Discussed prescriptions and follow up appointments with the patient. Prescriptions given to patient, medication list explained in detail. Pt verbalized understanding.  Allergies as of 06/30/2022   No Known Allergies      Medication List     STOP taking these medications    B-complex with vitamin C tablet   Biofreeze 4 % Gel Generic drug: Menthol (Topical Analgesic)   Cepacol Sore Throat & Cough 5-7.5 MG Lozg Generic drug: Dextromethorphan-Benzocaine   clindamycin-benzoyl peroxide gel Commonly known as: BENZACLIN   guaiFENesin 600 MG 12 hr tablet Commonly known as: MUCINEX   lidocaine 4 %   Lidocaine HCl 4 % Crea   metoCLOPramide 5 MG tablet Commonly known as: REGLAN   nystatin powder Commonly known as: MYCOSTATIN/NYSTOP   ondansetron 4 MG disintegrating tablet Commonly known as: ZOFRAN-ODT   promethazine 25 MG/ML injection Commonly known as: PHENERGAN   Vitamin B-Complex Tabs   Zinc Oxide 25 % Pste       TAKE these medications    acetaminophen 325 MG tablet Commonly known as: TYLENOL Take 650 mg by mouth every 6 (six) hours.   acidophilus Caps capsule Take 1 capsule by mouth daily.   ALPRAZolam 0.5 MG tablet Commonly known as: XANAX Take 1 tablet (0.5 mg total) by mouth daily as needed for anxiety. What changed:  medication strength how much to take when to take this reasons to take this Another medication with the same name was removed. Continue taking this medication, and follow the directions you see here.   alum & mag hydroxide-simeth 200-200-20 MG/5ML suspension Commonly known as: MAALOX/MYLANTA Take 30 mLs by mouth every 12 (twelve) hours as needed for indigestion or heartburn.   aspirin 81 MG chewable tablet Take 81 mg by mouth daily.   BENEFIBER DRINK MIX PO Take 5 g by mouth daily.   bisacodyl 10 MG suppository Commonly known as: DULCOLAX Place 10 mg rectally in  the morning.   bisacodyl 5 MG EC tablet Commonly known as: DULCOLAX Take 5 mg by mouth daily.   Calcium 600+D Plus Minerals 600-400 MG-UNIT Tabs Take 1 tablet by mouth 2 (two) times daily.   furosemide 20 MG tablet Commonly known as: LASIX Take 1 tablet (20 mg total) by mouth daily.   gabapentin 100 MG capsule Commonly known as: NEURONTIN Take 100 mg by mouth in the morning. (Take only with morning '800mg'$  dose to equal '900mg'$  total)   gabapentin 400 MG capsule Commonly known as: NEURONTIN Take 2 capsules (800 mg total) by mouth 3 (three) times daily.   HYDROcodone-acetaminophen 5-325 MG tablet Commonly known as: NORCO/VICODIN Take 0.5 tablets by mouth every 8 (eight) hours as needed for moderate pain. What changed:  when to take this reasons to take this   lactulose 10 GM/15ML solution Commonly known as: CHRONULAC Take 20 g by mouth daily.   levETIRAcetam 100 MG/ML solution Commonly known as: KEPPRA Take 1,000 mg by mouth 2 (two) times daily. What changed: Another medication with the same name was removed. Continue taking this medication, and follow the directions you see here.   lisinopril 5 MG tablet Commonly known as: ZESTRIL Take 1 tablet (5 mg total) by mouth daily.   melatonin 3 MG Tabs tablet Take 6 mg by mouth at bedtime.   metoprolol succinate 25 MG 24 hr tablet Commonly known as: TOPROL-XL Take 25 mg by mouth daily.   nicotine 14 mg/24hr patch Commonly known  as: NICODERM CQ - dosed in mg/24 hours Place 1 patch (14 mg total) onto the skin daily. Start taking on: July 01, 2022   nitroGLYCERIN 0.4 MG SL tablet Commonly known as: NITROSTAT Place 0.4 mg under the tongue every 5 (five) minutes as needed for chest pain.   Omega-3 1000 MG Caps Take 1,000 mg by mouth daily.   pantoprazole 20 MG tablet Commonly known as: PROTONIX Take 20 mg by mouth in the morning.   polyethylene glycol 17 g packet Commonly known as: MIRALAX / GLYCOLAX Take 34 g by  mouth daily.   senna 8.6 MG Tabs tablet Commonly known as: SENOKOT Take 1 tablet (8.6 mg total) by mouth at bedtime. What changed: when to take this   sertraline 50 MG tablet Commonly known as: ZOLOFT Take 50 mg by mouth daily.   simvastatin 10 MG tablet Commonly known as: ZOCOR Take 10 mg by mouth at bedtime.   tamsulosin 0.4 MG Caps capsule Commonly known as: FLOMAX Take 0.4 mg by mouth every evening.   tiotropium 18 MCG inhalation capsule Commonly known as: SPIRIVA Place 18 mcg into inhaler and inhale daily.               Discharge Care Instructions  (From admission, onward)           Start     Ordered   06/30/22 0000  Discharge wound care:       Comments: Ostomy care   06/30/22 1217            Vitals:   06/30/22 0809 06/30/22 1519  BP: 115/77 119/72  Pulse: 72 87  Resp: 14 18  Temp: (!) 97.5 F (36.4 C) 97.9 F (36.6 C)  SpO2: 96% 100%    Skin clean, dry and intact without evidence of skin break down, no evidence of skin tears noted. IV catheter discontinued intact. Site without signs and symptoms of complications. Dressing and pressure applied. Pt denies pain at this time. No complaints noted.  An After Visit Summary was printed and given to the patient. Patient escorted via EMS. Report called to [facility] and given to nurse [facility].  Calvin [name]

## 2022-07-01 ENCOUNTER — Emergency Department: Payer: Medicare Other

## 2022-07-01 ENCOUNTER — Emergency Department
Admission: EM | Admit: 2022-07-01 | Discharge: 2022-07-01 | Disposition: A | Discharge status: Home / Self Care | Payer: Medicare Other | Attending: Emergency Medicine | Admitting: Emergency Medicine

## 2022-07-01 DIAGNOSIS — I2510 Atherosclerotic heart disease of native coronary artery without angina pectoris: Secondary | ICD-10-CM | POA: Insufficient documentation

## 2022-07-01 DIAGNOSIS — R42 Dizziness and giddiness: Secondary | ICD-10-CM | POA: Diagnosis not present

## 2022-07-01 DIAGNOSIS — Z85819 Personal history of malignant neoplasm of unspecified site of lip, oral cavity, and pharynx: Secondary | ICD-10-CM | POA: Diagnosis not present

## 2022-07-01 DIAGNOSIS — R110 Nausea: Secondary | ICD-10-CM | POA: Insufficient documentation

## 2022-07-01 DIAGNOSIS — Z85038 Personal history of other malignant neoplasm of large intestine: Secondary | ICD-10-CM | POA: Diagnosis not present

## 2022-07-01 DIAGNOSIS — I10 Essential (primary) hypertension: Secondary | ICD-10-CM | POA: Insufficient documentation

## 2022-07-01 DIAGNOSIS — R0789 Other chest pain: Secondary | ICD-10-CM | POA: Diagnosis present

## 2022-07-01 DIAGNOSIS — Z7982 Long term (current) use of aspirin: Secondary | ICD-10-CM | POA: Insufficient documentation

## 2022-07-01 DIAGNOSIS — J449 Chronic obstructive pulmonary disease, unspecified: Secondary | ICD-10-CM | POA: Diagnosis not present

## 2022-07-01 DIAGNOSIS — Z96642 Presence of left artificial hip joint: Secondary | ICD-10-CM | POA: Diagnosis not present

## 2022-07-01 DIAGNOSIS — Z7951 Long term (current) use of inhaled steroids: Secondary | ICD-10-CM | POA: Insufficient documentation

## 2022-07-01 DIAGNOSIS — Z79899 Other long term (current) drug therapy: Secondary | ICD-10-CM | POA: Insufficient documentation

## 2022-07-01 DIAGNOSIS — R0602 Shortness of breath: Secondary | ICD-10-CM | POA: Insufficient documentation

## 2022-07-01 LAB — BASIC METABOLIC PANEL
Anion gap: 7 (ref 5–15)
BUN: 28 mg/dL — ABNORMAL HIGH (ref 8–23)
CO2: 28 mmol/L (ref 22–32)
Calcium: 8.8 mg/dL — ABNORMAL LOW (ref 8.9–10.3)
Chloride: 101 mmol/L (ref 98–111)
Creatinine, Ser: 1.26 mg/dL — ABNORMAL HIGH (ref 0.61–1.24)
GFR, Estimated: >60 mL/min (ref >60)
Glucose, Bld: 111 mg/dL — ABNORMAL HIGH (ref 70–99)
Potassium: 4.4 mmol/L (ref 3.5–5.1)
Sodium: 136 mmol/L (ref 135–145)

## 2022-07-01 LAB — CBC WITH DIFFERENTIAL/PLATELET
Abs Immature Granulocytes: 0.24 10*3/uL — ABNORMAL HIGH (ref 0.00–0.07)
Basophils Absolute: 0 10*3/uL (ref 0.0–0.1)
Basophils Relative: 0 %
Eosinophils Absolute: 0.1 10*3/uL (ref 0.0–0.5)
Eosinophils Relative: 2 %
HCT: 33 % — ABNORMAL LOW (ref 39.0–52.0)
Hemoglobin: 10.5 g/dL — ABNORMAL LOW (ref 13.0–17.0)
Immature Granulocytes: 3 %
Lymphocytes Relative: 13 %
Lymphs Abs: 1.1 10*3/uL (ref 0.7–4.0)
MCH: 30.3 pg (ref 26.0–34.0)
MCHC: 31.8 g/dL (ref 30.0–36.0)
MCV: 95.4 fL (ref 80.0–100.0)
Monocytes Absolute: 0.6 10*3/uL (ref 0.1–1.0)
Monocytes Relative: 7 %
Neutro Abs: 6.4 10*3/uL (ref 1.7–7.7)
Neutrophils Relative %: 75 %
Platelets: 224 10*3/uL (ref 150–400)
RBC: 3.46 MIL/uL — ABNORMAL LOW (ref 4.22–5.81)
RDW: 12.7 % (ref 11.5–15.5)
WBC: 8.6 10*3/uL (ref 4.0–10.5)
nRBC: 0.5 % — ABNORMAL HIGH (ref 0.0–0.2)

## 2022-07-01 LAB — TROPONIN I (HIGH SENSITIVITY)
Troponin I (High Sensitivity): 13 ng/L (ref <18)
Troponin I (High Sensitivity): 13 ng/L (ref <18)

## 2022-07-01 LAB — BRAIN NATRIURETIC PEPTIDE: B Natriuretic Peptide: 36.2 pg/mL (ref 0.0–100.0)

## 2022-07-01 MED ORDER — HYDROCODONE-ACETAMINOPHEN 5-325 MG PO TABS
1.0000 | ORAL_TABLET | Freq: Once | ORAL | Status: AC
  Administered 2022-07-01: 1 via ORAL
  Filled 2022-07-01: qty 1

## 2022-07-01 MED ORDER — TROLAMINE SALICYLATE 10 % EX CREA
TOPICAL_CREAM | Freq: Two times a day (BID) | CUTANEOUS | Status: DC | PRN
  Administered 2022-07-01: 1 via TOPICAL
  Filled 2022-07-01: qty 85

## 2022-07-01 NOTE — ED Triage Notes (Signed)
Pt from [facility] to ED via [facility]. EMS states that Pt had 10/10 crushing chest pain upon arrival. Pain subsided to a 0/10 after 3 nitro sublingual tablets. Pt has extensive medical and surgical hx.

## 2022-07-01 NOTE — ED Provider Notes (Signed)
71 year old male here with atypical chest pain.  The patient was just discharged to a skilled nursing facility.  Chest pain resolved now.  EKG nonischemic.  Plan at time of signout is to repeat troponin and discharge if negative.  Repeat troponin is unchanged at 13.  Discharged to his skilled nursing facility in stable condition.   Duffy Bruce, MD 07/01/22 1021

## 2022-07-01 NOTE — ED Provider Notes (Signed)
[facility] Provider Note    Event Date/Time   First MD Initiated Contact with Patient 07/01/22 [phone]     (approximate)   History   Chest Pain   HPI  Calvin [name] is a 71 y.o. male with history of alcohol abuse, COPD, CAD, CVA with left-sided weakness, hypertension who [facility] with complaints of chest pain, shortness of breath, nausea and dizziness that started just prior to arrival but has already resolved.  Symptoms came on at rest.  No aggravating relieving factors.  No fever, cough, lower extremity swelling or pain.  No vomiting.   History provided by patient.    Past Medical History:  Diagnosis Date   Alcohol abuse    drinks on weekend   Anemia    Anxiety    Arthritis    Cancer ([place])    colon,throat   COPD (chronic obstructive pulmonary disease) ([place])    Coronary artery disease    Depression    Gout    Hypertension    Myocardial infarction [facility])    Neuromuscular disorder ([place])    Seizures ([place])    last 6 months ago   Stroke [facility])    multiple  left side weakness   Tremors of nervous system     Past Surgical History:  Procedure Laterality Date   CARPAL TUNNEL RELEASE Left 10/19/2015   Procedure: CARPAL TUNNEL RELEASE;  Surgeon: Hessie Knows, MD;  Location: [facility] ORS;  Service: Orthopedics;  Laterality: Left;   COLON SURGERY     COLONOSCOPY WITH PROPOFOL N/A 10/26/2021   Procedure: COLONOSCOPY WITH PROPOFOL;  Surgeon: Lesly Rubenstein, MD;  Location: [facility] ENDOSCOPY;  Service: Endoscopy;  Laterality: N/A;   COLONOSCOPY WITH PROPOFOL N/A 06/02/2022   Procedure: COLONOSCOPY WITH PROPOFOL;  Surgeon: Lesly Rubenstein, MD;  Location: [facility] ENDOSCOPY;  Service: Endoscopy;  Laterality: N/A;   COLONOSCOPY WITH PROPOFOL N/A 06/06/2022   Procedure: COLONOSCOPY WITH PROPOFOL;  Surgeon: Lesly Rubenstein, MD;  Location: [facility] ENDOSCOPY;  Service: Endoscopy;  Laterality: N/A;   JOINT REPLACEMENT     left partial  hip    THROAT SURGERY  2013   cancer    MEDICATIONS:  Prior to Admission medications   Medication Sig Start Date End Date Taking? Authorizing Provider  acetaminophen (TYLENOL) 325 MG tablet Take 650 mg by mouth every 6 (six) hours.    [provider]  acidophilus (RISAQUAD) CAPS capsule Take 1 capsule by mouth daily.    [provider]  ALPRAZolam Duanne Moron) 0.5 MG tablet Take 1 tablet (0.5 mg total) by mouth daily as needed for anxiety. 06/30/22   Jennye Boroughs, MD  alum & mag hydroxide-simeth (MAALOX/MYLANTA) 200-200-20 MG/5ML suspension Take 30 mLs by mouth every 12 (twelve) hours as needed for indigestion or heartburn.    [provider]  aspirin 81 MG chewable tablet Take 81 mg by mouth daily.    [provider]  bisacodyl (DULCOLAX) 10 MG suppository Place 10 mg rectally in the morning.    [provider]  bisacodyl (DULCOLAX) 5 MG EC tablet Take 5 mg by mouth daily.    [provider]  Calcium Carbonate-Vit D-Min (CALCIUM 600+D PLUS MINERALS) 600-400 MG-UNIT TABS Take 1 tablet by mouth 2 (two) times daily.    [provider]  furosemide (LASIX) 20 MG tablet Take 1 tablet (20 mg total) by mouth daily. 11/02/21   Wouk, Ailene Rud, MD  gabapentin (NEURONTIN) 100 MG capsule Take  100 mg by mouth in the morning. (Take only with morning '800mg'$  dose to equal '900mg'$  total)    [provider]  gabapentin (NEURONTIN) 400 MG capsule Take 2 capsules (800 mg total) by mouth 3 (three) times daily. 10/02/17   Max Sane, MD  HYDROcodone-acetaminophen (NORCO/VICODIN) 5-325 MG tablet Take 0.5 tablets by mouth every 8 (eight) hours as needed for moderate pain. 06/30/22   Jennye Boroughs, MD  lactulose (CHRONULAC) 10 GM/15ML solution Take 20 g by mouth daily.    [provider]  levETIRAcetam (KEPPRA) 100 MG/ML solution Take 1,000 mg by mouth 2 (two) times daily.    [provider]  lisinopril (ZESTRIL) 5 MG tablet Take 1  tablet (5 mg total) by mouth daily. 08/14/19   Dhungel, Nishant, MD  melatonin 3 MG TABS tablet Take 6 mg by mouth at bedtime.    [provider]  metoprolol succinate (TOPROL-XL) 25 MG 24 hr tablet Take 25 mg by mouth daily.    [provider]  nicotine (NICODERM CQ - DOSED IN MG/24 HOURS) 14 mg/24hr patch Place 1 patch (14 mg total) onto the skin daily. 07/01/22   Jennye Boroughs, MD  nitroGLYCERIN (NITROSTAT) 0.4 MG SL tablet Place 0.4 mg under the tongue every 5 (five) minutes as needed for chest pain.    [provider]  Omega-3 1000 MG CAPS Take 1,000 mg by mouth daily.    [provider]  pantoprazole (PROTONIX) 20 MG tablet Take 20 mg by mouth in the morning.    [provider]  polyethylene glycol (MIRALAX / GLYCOLAX) 17 g packet Take 34 g by mouth daily. 06/30/22   Jennye Boroughs, MD  senna (SENOKOT) 8.6 MG TABS tablet Take 1 tablet (8.6 mg total) by mouth at bedtime. Patient taking differently: Take 1 tablet by mouth every evening. 09/20/19   Rudene Re, MD  sertraline (ZOLOFT) 50 MG tablet Take 50 mg by mouth daily.    [provider]  simvastatin (ZOCOR) 10 MG tablet Take 10 mg by mouth at bedtime.     [provider]  tamsulosin (FLOMAX) 0.4 MG CAPS capsule Take 0.4 mg by mouth every evening.    [provider]  tiotropium (SPIRIVA) 18 MCG inhalation capsule Place 18 mcg into inhaler and inhale daily.    [provider]  Wheat Dextrin (BENEFIBER DRINK MIX PO) Take 5 g by mouth daily.    [provider]    Physical Exam   Triage Vital Signs: ED Triage Vitals [07/01/22 0553]  Enc Vitals Group     BP 110/79     Pulse Rate 82     Resp 20     Temp 97.9 F (36.6 C)     Temp Source Oral     SpO2 98 %     Weight      Height      Head Circumference      [facility] Flow      Pain Score      Pain Loc      Pain Edu?      Excl. in [place]?     Most recent vital signs: Vitals:   07/01/22 0553  07/01/22 0643  BP: 110/79 110/79  Pulse: 82 82  Resp: 20   Temp: 97.9 F (36.6 C) 97.9 F (36.6 C)  SpO2: 98% 98%    CONSTITUTIONAL: Alert and oriented and responds appropriately to questions.  Chronically ill-appearing, elderly HEAD: Normocephalic, atraumatic EYES: Conjunctivae clear, pupils appear  equal, sclera nonicteric ENT: normal nose; moist mucous membranes NECK: Supple, normal ROM CARD: RRR; S1 and S2 appreciated; no murmurs, no clicks, no rubs, no gallops RESP: Normal chest excursion without splinting or tachypnea; breath sounds clear and equal bilaterally; no wheezes, no rhonchi, no rales, no hypoxia or respiratory distress, speaking full sentences ABD/GI: Normal bowel sounds; non-distended; soft, non-tender, no rebound, no guarding, no peritoneal signs, left-sided ostomy BACK: The back appears normal EXT: Normal ROM in all joints; no deformity noted, no edema; no cyanosis, no calf tenderness or calf swelling SKIN: Normal color for age and race; warm; no rash on exposed skin NEURO: Moves all extremities equally, normal speech PSYCH: The patient's mood and manner are appropriate.   ED Results / Procedures / Treatments   LABS: (all labs ordered are listed, but only abnormal results are displayed) Labs Reviewed  CBC WITH DIFFERENTIAL/PLATELET - Abnormal; Notable for the following components:      Result Value   RBC 3.46 (*)    Hemoglobin 10.5 (*)    HCT 33.0 (*)    nRBC 0.5 (*)    Abs Immature Granulocytes 0.24 (*)    All other components within normal limits  BASIC METABOLIC PANEL - Abnormal; Notable for the following components:   Glucose, Bld 111 (*)    BUN 28 (*)    Creatinine, Ser 1.26 (*)    Calcium 8.8 (*)    All other components within normal limits  BRAIN NATRIURETIC PEPTIDE  TROPONIN I (HIGH SENSITIVITY)     EKG:  EKG Interpretation  Date/Time:  Saturday July 01 2022 05:45:37 EDT Ventricular Rate:  85 PR Interval:  254 QRS Duration: 89 QT  Interval:  372 QTC Calculation: 443 R Axis:   -49 Text Interpretation: Sinus rhythm Prolonged PR interval Inferior infarct, old Confirmed by Pryor Curia [phone]) on 07/01/2022 6:31:14 AM         RADIOLOGY: My personal review and interpretation of imaging: Chest x-ray clear.  I have personally reviewed all radiology reports.   DG Chest Portable 1 View  Result Date: 07/01/2022 CLINICAL DATA:  71 year old male with chest pain and shortness of breath. EXAM: PORTABLE CHEST 1 VIEW COMPARISON:  Chest radiographs 06/19/2022 and earlier. FINDINGS: Portable AP semi upright view at 0622 hours. Chronic elevation of the left hemidiaphragm, possibly a normal variant but was not present on 2007 chest radiographs. Stable cardiac size and mediastinal contours. No cardiomegaly. Visualized tracheal air column is within normal limits. Right PICC line earlier this month has been removed. Allowing for portable technique the lungs are clear. No pneumothorax or pleural effusion. Some retained oral contrast in bowel in the left upper quadrant. No acute osseous abnormality identified. IMPRESSION: No acute cardiopulmonary abnormality. Chronic elevation of the right hemidiaphragm, normal anatomic variation versus chronic right phrenic nerve palsy. Electronically Signed   By: Genevie Ann M.D.   On: 07/01/2022 07:00     PROCEDURES:  Critical Care performed: No      .1-3 Lead EKG Interpretation  Performed by: Aaliyah Gavel, Delice Bison, DO Authorized by: Ryon Layton, Delice Bison, DO     Interpretation: normal     ECG rate:  82   ECG rate assessment: normal     Rhythm: sinus rhythm     Ectopy: none     Conduction: normal       IMPRESSION / MDM / ASSESSMENT AND PLAN / ED COURSE  I reviewed the triage vital signs and the nursing notes.    Patient here with  complaints of chest pain, shortness of breath that has resolved.  The patient is on the cardiac monitor to evaluate for evidence of arrhythmia and/or significant heart  rate changes.   DIFFERENTIAL DIAGNOSIS (includes but not limited to):   ACS, less likely PE or dissection given symptoms spontaneous resolved.  History of CHF but does not appear volume overloaded currently.  Doubt pneumonia, pneumothorax.   Patient's presentation is most consistent with acute presentation with potential threat to life or bodily function.   PLAN: We will obtain CBC, BMP, BNP due to his history of CHF, troponin x2, chest x-ray.  EKG is nonischemic.   MEDICATIONS GIVEN IN ED: Medications - No data to display   ED COURSE: Patient's labs show stable anemia.  Stable chronic kidney disease.  First troponin negative.  BNP is 36.  Second troponin pending.  Chest x-ray reviewed and interpreted by myself and the radiologist and shows no acute abnormality.  Signed out the oncoming ED physician.  Anticipate if second troponin is negative and patient is still asymptomatic and hemodynamically stable that he can be discharged back to his nursing facility with outpatient follow-up.   CONSULTS: Pending further work-up.   OUTSIDE RECORDS REVIEWED: Reviewed patient's recent admission to the hospital and discharged yesterday.       FINAL CLINICAL IMPRESSION(S) / ED DIAGNOSES   Final diagnoses:  Atypical chest pain     Rx / DC Orders   ED Discharge Orders     None        Note:  This document was prepared using Dragon voice recognition software and may include unintentional dictation errors.   Arohi Salvatierra, Delice Bison, DO 07/01/22 [phone]

## 2022-07-06 ENCOUNTER — Ambulatory Visit: Payer: Medicare Other | Admitting: Cardiology

## 2022-07-10 ENCOUNTER — Non-Acute Institutional Stay: Payer: Medicare Other | Admitting: Primary Care

## 2022-07-10 DIAGNOSIS — Z515 Encounter for palliative care: Secondary | ICD-10-CM

## 2022-07-10 DIAGNOSIS — K567 Ileus, unspecified: Secondary | ICD-10-CM

## 2022-07-10 DIAGNOSIS — R531 Weakness: Secondary | ICD-10-CM

## 2022-07-10 DIAGNOSIS — K5981 Ogilvie syndrome: Secondary | ICD-10-CM

## 2022-07-10 NOTE — Progress Notes (Signed)
[facility] Palliative Care Consult Note Telephone: [phone]  Fax: [phone]   Date of encounter: 07/10/22 2:29 PM [name] [address] [place] [address]   [id number] (home) [id number] (work) DOB: 28-Aug-1951 MRN: [id number] PRIMARY CARE PROVIDER:    Betsey Holiday, MD,  [address] [place] [address] [phone]  REFERRING PROVIDER:   Betsey Holiday, MD,  [address] [place] [address] [phone]  RESPONSIBLE PARTY:    Contact Information     Name Relation Home Work Mobile   Jones,[name] Daughter   [phone]   [name], [name] [phone]    [name], [name] Sister   [phone]        I met face to face with patient  in [facility]. Palliative Care was asked to follow this patient by consultation request of  Ho, Maudry Mayhew, MD  to address advance care planning and complex medical decision making. This is the initial visit.                                     ASSESSMENT AND PLAN / RECOMMENDATIONS:   Advance Care Planning/Goals of Care: Goals include to maximize quality of life and symptom management. Patient/health care surrogate gave his/her permission to discuss.Our advance care planning conversation included a discussion about:     Exploration of personal, cultural or spiritual beliefs that might influence medical decisions   Identification of a healthcare agent - daughter  CODE STATUS: FULL  I reviewed a MOST form today. The patient and family outlined their wishes for the following treatment decisions:  Cardiopulmonary Resuscitation: Attempt Resuscitation (CPR)  Medical Interventions: Full Scope of Treatment: Use intubation, advanced airway interventions, mechanical ventilation, cardioversion as indicated, medical treatment, IV fluids, etc, also provide comfort measures. Transfer to the hospital if indicated  Antibiotics:  Antibiotics if indicated  IV Fluids: IV fluids if indicated  Feeding Tube: No feeding tube   Symptom Management/Plan:   I met with patient in his nursing home room. He endorses, shaking and pain down the left side of his body. He endorses to CVAs. Patient was at another nursing home until recently when [facility] was [facility]. He's not a pured diet and states his appetite is good he's either hundred percent of lunch. He continues to complain of severe pain which may be related to CVA. Currently has a narcotic every eight hours, I would recommend every four hours for better pain management as tolerated.   Follow up Palliative Care Visit: Palliative care will continue to follow for complex medical decision making, advance care planning, and clarification of goals. Return 6 weeks or prn.   This visit was coded based on medical decision making (MDM).  PPS: 30%  HOSPICE ELIGIBILITY/DIAGNOSIS: TBD  Chief Complaint: debility  HISTORY OF PRESENT ILLNESS:  Calvin [name] is a 71 y.o. year old male  with bowel obstruction, CVA, L sided weakness, chronic pain . Patient seen today to review palliative care needs to include medical decision making and advance care planning as appropriate.   History obtained from review of EMR, discussion with primary team, and interview with family, facility staff/caregiver and/or Calvin [name].  I reviewed available labs, medications, imaging, studies and related documents from the EMR.  Records reviewed and summarized above.   ROS  General: NAD ENMT: endorses dysphagia Cardiovascular: denies  chest pain, denies DOE Pulmonary: denies cough, denies increased SOB Abdomen: endorses good appetite, denies constipation, endorses continence of bowel (ostomy) GU: denies dysuria, endorses continence of urine MSK:  denies increased weakness,  no falls reported Skin: denies rashes or wounds Neurological: endorses neuropathic pain, denies insomnia Psych: Endorses positive  mood Heme/lymph/immuno: denies bruises, abnormal bleeding  Physical Exam: Current and past weights: Constitutional: Painful General: frail appearing, wnwd EYES: anicteric sclera, lids intact, no discharge  ENMT: intact hearing, oral mucous membranes moist CV: no LE edema Pulmonary: no increased work of breathing, no cough, room air Abdomen: intake 100%,  soft and non tender, no ascites, + ostomy MSK: mild sarcopenia, moves all extremities, non ambulatory Skin: warm and dry, no rashes or wounds on visible skin Neuro:  + generalized weakness,  + cognitive impairment Psych: anxious affect, A and O x 2 Hem/lymph/immuno: no widespread bruising CURRENT PROBLEM LIST:  Patient Active Problem List   Diagnosis Date Noted   Headache 06/18/2022   Acute kidney injury superimposed on CKD ([place]) 06/17/2022   Dysphagia 06/07/2022   Respiratory distress 06/02/2022   Neuropathy 06/02/2022   Paroxysmal atrial flutter ([place]) 06/01/2022   Aspiration pneumonia ([place]) 06/01/2022   Cervical spinal stenosis 05/31/2022   Chronic diastolic CHF (congestive heart failure) ([place]) 05/28/2022   Small bowel obstruction (HCC)    Abdominal distention    HLD (hyperlipidemia) 10/16/2021   Nausea vomiting and diarrhea 10/16/2021   Sepsis ([place]) 10/16/2021   Tobacco abuse 10/16/2021   Muscle twitching 08/14/2019   UTI (urinary tract infection) 08/03/2019   Ileus ([place]) 08/03/2019   Hypokalemia 08/03/2019   QT prolongation 08/03/2019   Ogilvie syndrome    Acute abdominal pain 04/03/2019   Left-sided weakness 09/30/2017   Seizures ([place]) 09/30/2017   HTN (hypertension) 09/30/2017   CAD (coronary artery disease) 09/30/2017   COPD (chronic obstructive pulmonary disease) ([place]) 09/30/2017   Depression with anxiety 09/30/2017   PAST MEDICAL HISTORY:  Active Ambulatory Problems    Diagnosis Date Noted   Left-sided weakness 09/30/2017   Seizures ([place]) 09/30/2017   HTN (hypertension) 09/30/2017   CAD (coronary artery  disease) 09/30/2017   COPD (chronic obstructive pulmonary disease) ([place]) 09/30/2017   Depression with anxiety 09/30/2017   Acute abdominal pain 04/03/2019   Ogilvie syndrome    UTI (urinary tract infection) 08/03/2019   Ileus ([place]) 08/03/2019   Hypokalemia 08/03/2019   QT prolongation 08/03/2019   Muscle twitching 08/14/2019   HLD (hyperlipidemia) 10/16/2021   Nausea vomiting and diarrhea 10/16/2021   Sepsis ([place]) 10/16/2021   Tobacco abuse 10/16/2021   Abdominal distention    Chronic diastolic CHF (congestive heart failure) ([place]) 05/28/2022   Small bowel obstruction (HCC)    Cervical spinal stenosis 05/31/2022   Paroxysmal atrial flutter ([place]) 06/01/2022   Aspiration pneumonia ([place]) 06/01/2022   Respiratory distress 06/02/2022   Neuropathy 06/02/2022   Dysphagia 06/07/2022   Acute kidney injury superimposed on CKD ([place]) 06/17/2022   Headache 06/18/2022   Resolved Ambulatory Problems    Diagnosis Date Noted   Alcohol abuse 10/16/2021   Past Medical History:  Diagnosis Date   Anemia    Anxiety    Arthritis    Cancer ([place])    Coronary artery disease    Depression    Gout    Hypertension    Myocardial infarction ([place])    Neuromuscular disorder ([place])    Stroke ([place])    Tremors of nervous system    SOCIAL HX:  Social History  Tobacco Use   Smoking status: Every Day   Smokeless tobacco: Never  Substance Use Topics   Alcohol use: Yes    Comment: weekends   FAMILY HX:  Family History  Problem Relation Age of Onset   Cancer Mother    Hypertension Father    Leukemia Brother       ALLERGIES: No Known Allergies   PERTINENT MEDICATIONS:  Outpatient Encounter Medications as of 07/10/2022  Medication Sig   acetaminophen (TYLENOL) 325 MG tablet Take 650 mg by mouth every 6 (six) hours.   acidophilus (RISAQUAD) CAPS capsule Take 1 capsule by mouth daily.   ALPRAZolam (XANAX) 0.5 MG tablet Take 1 tablet (0.5 mg total) by mouth daily as needed for anxiety.   alum &  mag hydroxide-simeth (MAALOX/MYLANTA) 200-200-20 MG/5ML suspension Take 30 mLs by mouth every 12 (twelve) hours as needed for indigestion or heartburn.   aspirin 81 MG chewable tablet Take 81 mg by mouth daily.   bisacodyl (DULCOLAX) 10 MG suppository Place 10 mg rectally in the morning.   bisacodyl (DULCOLAX) 5 MG EC tablet Take 5 mg by mouth daily.   Calcium Carbonate-Vit D-Min (CALCIUM 600+D PLUS MINERALS) 600-400 MG-UNIT TABS Take 1 tablet by mouth 2 (two) times daily.   furosemide (LASIX) 20 MG tablet Take 1 tablet (20 mg total) by mouth daily.   gabapentin (NEURONTIN) 100 MG capsule Take 100 mg by mouth in the morning. (Take only with morning 861m dose to equal 9058mtotal)   gabapentin (NEURONTIN) 400 MG capsule Take 2 capsules (800 mg total) by mouth 3 (three) times daily.   HYDROcodone-acetaminophen (NORCO/VICODIN) 5-325 MG tablet Take 0.5 tablets by mouth every 8 (eight) hours as needed for moderate pain.   lactulose (CHRONULAC) 10 GM/15ML solution Take 20 g by mouth daily.   levETIRAcetam (KEPPRA) 100 MG/ML solution Take 1,000 mg by mouth 2 (two) times daily.   lisinopril (ZESTRIL) 5 MG tablet Take 1 tablet (5 mg total) by mouth daily.   melatonin 3 MG TABS tablet Take 6 mg by mouth at bedtime.   metoprolol succinate (TOPROL-XL) 25 MG 24 hr tablet Take 25 mg by mouth daily.   nicotine (NICODERM CQ - DOSED IN MG/24 HOURS) 14 mg/24hr patch Place 1 patch (14 mg total) onto the skin daily.   nitroGLYCERIN (NITROSTAT) 0.4 MG SL tablet Place 0.4 mg under the tongue every 5 (five) minutes as needed for chest pain.   Omega-3 1000 MG CAPS Take 1,000 mg by mouth daily.   pantoprazole (PROTONIX) 20 MG tablet Take 20 mg by mouth in the morning.   polyethylene glycol (MIRALAX / GLYCOLAX) 17 g packet Take 34 g by mouth daily.   senna (SENOKOT) 8.6 MG TABS tablet Take 1 tablet (8.6 mg total) by mouth at bedtime. (Patient taking differently: Take 1 tablet by mouth every evening.)   sertraline  (ZOLOFT) 50 MG tablet Take 50 mg by mouth daily.   simvastatin (ZOCOR) 10 MG tablet Take 10 mg by mouth at bedtime.    tamsulosin (FLOMAX) 0.4 MG CAPS capsule Take 0.4 mg by mouth every evening.   tiotropium (SPIRIVA) 18 MCG inhalation capsule Place 18 mcg into inhaler and inhale daily.   Wheat Dextrin (BENEFIBER DRINK MIX PO) Take 5 g by mouth daily.   No facility-administered encounter medications on file as of 07/10/2022.   Thank you for the opportunity to participate in the care of Mr. FoWhilden The palliative care team will continue to follow. Please call our office  at [phone] if we can be of additional assistance.   Jason Coop, NP , DNP, AGPCNP-BC  COVID-19 PATIENT SCREENING TOOL Asked and negative response unless otherwise noted:  Have you had symptoms of covid, tested positive or been in contact with someone with symptoms/positive test in the past 5-10 days?

## 2022-07-11 ENCOUNTER — Encounter: Payer: Medicare Other | Admitting: Physician Assistant

## 2022-07-17 NOTE — Progress Notes (Deleted)
Referring Physician:  No referring provider defined for this encounter.  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   He was seen by Dr. Izora Ribas as a hospital consult on 06/06/22 for mild left sided weakness and trouble walking. Was not sure when last walked or when he developed upper extremity weakness.   Cervical MRI showed cervical stenosis with unclear symptoms of myelopathy. He was to mobilize with PT and he is here for follow up.     Duration: *** Location: *** Quality: *** Severity: ***  Precipitating: aggravated by *** Modifying factors: made better by *** Weakness: none Timing: *** Bowel/Bladder Dysfunction: none  Conservative measures:  Physical therapy: ***  Multimodal medical therapy including regular antiinflammatories: ***  Injections: *** epidural steroid injections  Past Surgery: ***  Calvin [name] has ***no symptoms of cervical myelopathy.  The symptoms are causing a significant impact on the patient's life.   Review of Systems:  A 10 point review of systems is negative, except for the pertinent positives and negatives detailed in the HPI.  Past Medical History: Past Medical History:  Diagnosis Date   Alcohol abuse    drinks on weekend   Anemia    Anxiety    Arthritis    Cancer ([place])    colon,throat   COPD (chronic obstructive pulmonary disease) ([place])    Coronary artery disease    Depression    Gout    Hypertension    Myocardial infarction [facility])    Neuromuscular disorder ([place])    Seizures ([place])    last 6 months ago   Stroke [facility])    multiple  left side weakness   Tremors of nervous system     Past Surgical History: Past Surgical History:  Procedure Laterality Date   CARPAL TUNNEL RELEASE Left 10/19/2015   Procedure: CARPAL TUNNEL RELEASE;  Surgeon: Hessie Knows, MD;  Location: [facility] ORS;  Service: Orthopedics;  Laterality: Left;   COLON  SURGERY     COLONOSCOPY WITH PROPOFOL N/A 10/26/2021   Procedure: COLONOSCOPY WITH PROPOFOL;  Surgeon: Lesly Rubenstein, MD;  Location: [facility] ENDOSCOPY;  Service: Endoscopy;  Laterality: N/A;   COLONOSCOPY WITH PROPOFOL N/A 06/02/2022   Procedure: COLONOSCOPY WITH PROPOFOL;  Surgeon: Lesly Rubenstein, MD;  Location: [facility] ENDOSCOPY;  Service: Endoscopy;  Laterality: N/A;   COLONOSCOPY WITH PROPOFOL N/A 06/06/2022   Procedure: COLONOSCOPY WITH PROPOFOL;  Surgeon: Lesly Rubenstein, MD;  Location: [facility] ENDOSCOPY;  Service: Endoscopy;  Laterality: N/A;   JOINT REPLACEMENT     left partial hip    THROAT SURGERY  2013   cancer    Allergies: Allergies as of 07/18/2022   (No Known Allergies)    Medications: Outpatient Encounter Medications as of 07/18/2022  Medication Sig   acetaminophen (TYLENOL) 325 MG tablet Take 650 mg by mouth every 6 (six) hours.   acidophilus (RISAQUAD) CAPS capsule Take 1 capsule by mouth daily.   ALPRAZolam (XANAX) 0.5 MG tablet Take 1 tablet (0.5 mg total) by mouth daily as needed for anxiety.   alum & mag hydroxide-simeth (MAALOX/MYLANTA) 200-200-20 MG/5ML suspension Take 30 mLs by mouth every 12 (twelve) hours as needed for indigestion or heartburn.   aspirin 81 MG chewable tablet Take 81 mg by mouth daily.   bisacodyl (DULCOLAX) 10 MG suppository Place 10 mg rectally in the morning.   bisacodyl (DULCOLAX) 5 MG EC tablet Take 5 mg by mouth  daily.   Calcium Carbonate-Vit D-Min (CALCIUM 600+D PLUS MINERALS) 600-400 MG-UNIT TABS Take 1 tablet by mouth 2 (two) times daily.   furosemide (LASIX) 20 MG tablet Take 1 tablet (20 mg total) by mouth daily.   gabapentin (NEURONTIN) 100 MG capsule Take 100 mg by mouth in the morning. (Take only with morning '800mg'$  dose to equal '900mg'$  total)   gabapentin (NEURONTIN) 400 MG capsule Take 2 capsules (800 mg total) by mouth 3 (three) times daily.   HYDROcodone-acetaminophen (NORCO/VICODIN) 5-325 MG tablet Take 0.5 tablets by  mouth every 8 (eight) hours as needed for moderate pain.   lactulose (CHRONULAC) 10 GM/15ML solution Take 20 g by mouth daily.   levETIRAcetam (KEPPRA) 100 MG/ML solution Take 1,000 mg by mouth 2 (two) times daily.   lisinopril (ZESTRIL) 5 MG tablet Take 1 tablet (5 mg total) by mouth daily.   melatonin 3 MG TABS tablet Take 6 mg by mouth at bedtime.   metoprolol succinate (TOPROL-XL) 25 MG 24 hr tablet Take 25 mg by mouth daily.   nicotine (NICODERM CQ - DOSED IN MG/24 HOURS) 14 mg/24hr patch Place 1 patch (14 mg total) onto the skin daily.   nitroGLYCERIN (NITROSTAT) 0.4 MG SL tablet Place 0.4 mg under the tongue every 5 (five) minutes as needed for chest pain.   Omega-3 1000 MG CAPS Take 1,000 mg by mouth daily.   pantoprazole (PROTONIX) 20 MG tablet Take 20 mg by mouth in the morning.   polyethylene glycol (MIRALAX / GLYCOLAX) 17 g packet Take 34 g by mouth daily.   senna (SENOKOT) 8.6 MG TABS tablet Take 1 tablet (8.6 mg total) by mouth at bedtime. (Patient taking differently: Take 1 tablet by mouth every evening.)   sertraline (ZOLOFT) 50 MG tablet Take 50 mg by mouth daily.   simvastatin (ZOCOR) 10 MG tablet Take 10 mg by mouth at bedtime.    tamsulosin (FLOMAX) 0.4 MG CAPS capsule Take 0.4 mg by mouth every evening.   tiotropium (SPIRIVA) 18 MCG inhalation capsule Place 18 mcg into inhaler and inhale daily.   Wheat Dextrin (BENEFIBER DRINK MIX PO) Take 5 g by mouth daily.   No facility-administered encounter medications on file as of 07/18/2022.    Social History: Social History   Tobacco Use   Smoking status: Every Day   Smokeless tobacco: Never  Substance Use Topics   Alcohol use: Yes    Comment: weekends   Drug use: No    Family Medical History: Family History  Problem Relation Age of Onset   Cancer Mother    Hypertension Father    Leukemia Brother     Physical Examination: There were no vitals filed for this visit.  General: Patient is well developed, well  nourished, calm, collected, and in no apparent distress. Attention to examination is appropriate.  Respiratory: Patient is breathing without any difficulty.   NEUROLOGICAL:     Awake, alert, oriented to person, place, and time.  Speech is clear and fluent. Fund of knowledge is appropriate.   Cranial Nerves: Pupils equal round and reactive to light.  Facial tone is symmetric.  Facial sensation is symmetric.  ROM of spine:  *** ROM of cervical spine *** pain *** ROM of lumbar spine *** pain  No abnormal lesions on exposed skin.   Strength: Side Biceps Triceps Deltoid Interossei Grip Wrist Ext. Wrist Flex.  R '5 5 5 5 5 5 5  '$ L '5 5 5 5 5 5 5   '$ Side Iliopsoas  Quads Hamstring PF DF EHL  R '5 5 5 5 5 5  '$ L '5 5 5 5 5 5   '$ Reflexes are ***2+ and symmetric at the biceps, triceps, brachioradialis, patella and achilles.   Hoffman's is absent.  Clonus is not present.   Bilateral upper and lower extremity sensation is intact to light touch.    No evidence of dysmetria noted.  Gait is normal.   ***No difficulty with tandem gait.    Medical Decision Making  Imaging: MRI of cervical spine dated 05/28/22:  FINDINGS: Alignment: Smooth reversal of the cervical lordosis. Grade 1 anterolisthesis of C4 on C5 and C6 on C7.   Vertebrae: No fracture, evidence of discitis, or bone lesion.   Cord: No definite cord signal abnormality.   Posterior Fossa, vertebral arteries, paraspinal tissues: Chronic absence of the left vertebral artery flow void. No acute paraspinal abnormality.   Disc levels:   C2-C3: Disc osteophyte complex with bilateral facet arthropathy and prominent left-sided uncovertebral spurring. Moderate canal stenosis with severe left and mild right foraminal stenosis. Findings slightly progressed from prior.   C3-C4: Disc osteophyte complex with bilateral facet and uncovertebral arthropathy. Moderate to severe canal stenosis with severe bilateral foraminal stenosis. Findings  slightly progressed from prior.   C4-C5: Mild disc osteophyte complex with bilateral facet arthropathy and uncovertebral spurring. Borderline-mild canal stenosis with mild-to-moderate bilateral foraminal stenosis. No significant progression.   C5-C6: Disc osteophyte complex, eccentric to the left with mild facet and uncovertebral arthropathy. No canal stenosis. Mild-to-moderate bilateral foraminal stenosis. No significant progression.   C6-C7: Mild disc uncovering with minimal disc bulge. Annular fissure in the left foraminal zone. Mild bilateral facet arthropathy. Moderate left and mild right foraminal stenosis. No canal stenosis. Slight interval progression from prior.   C7-T1: No disc protrusion. Mild facet arthropathy. No foraminal or canal stenosis. Unchanged.   IMPRESSION: 1. Multilevel cervical spondylosis, most pronounced at the C3-4 level where there is moderate-to-severe canal stenosis and severe bilateral foraminal stenosis. Findings slightly progressed from prior MRI. 2. Moderate canal stenosis with severe left foraminal stenosis at C2-C3. 3. Chronically occluded left vertebral artery.     Electronically Signed   By: Davina Poke D.O.   On: 05/28/2022 17:29   I have personally reviewed the images and agree with the above interpretation.  Assessment and Plan: Mr. Jawad is a pleasant 71 y.o. male with ***  Treatment options discussed with patient and following plan made:   - Order for physical therapy for *** spine ***. - Continue on current medications including ***. Reviewed proper dosing along with risks and benefits. Take and NSAIDs with food.      I spent a total of *** minutes in face-to-face and non-face-to-face activities related to this patient's care toda including review of outside records, review of imaging, review of symptoms, physical exam, discussion of differential diagnosis, discussion of treatment options, and documentation.   Thank  you for involving me in the care of this patient.   Geronimo Boot PA-C Dept. of Neurosurgery

## 2022-07-18 ENCOUNTER — Ambulatory Visit: Payer: Medicare Other | Admitting: Orthopedic Surgery

## 2022-07-20 ENCOUNTER — Encounter: Payer: Medicare Other | Admitting: Physician Assistant

## 2022-07-21 ENCOUNTER — Ambulatory Visit: Payer: Medicare Other | Admitting: Orthopedic Surgery

## 2022-08-01 ENCOUNTER — Encounter: Payer: Self-pay | Admitting: Physician Assistant

## 2022-08-01 ENCOUNTER — Ambulatory Visit (INDEPENDENT_AMBULATORY_CARE_PROVIDER_SITE_OTHER): Payer: Medicare Other | Admitting: Physician Assistant

## 2022-08-01 VITALS — BP 78/53 | HR 85 | Temp 98.0°F | Ht 71.5 in

## 2022-08-01 DIAGNOSIS — Z09 Encounter for follow-up examination after completed treatment for conditions other than malignant neoplasm: Secondary | ICD-10-CM

## 2022-08-01 DIAGNOSIS — K5981 Ogilvie syndrome: Secondary | ICD-10-CM

## 2022-08-01 NOTE — Patient Instructions (Signed)
If you have any concerns or questions, please feel free to call our office.   Colostomy Home Guide, Adult  A colostomy is a surgically created opening (ostomy) that brings a piece of the large intestine through an opening in the abdomen to create a stoma. The stoma allows stool (feces) and gas (flatus) to leave the body. A stoma may be a variety of shapes and sizes. An ostomy pouch or bag is used to cover the stoma and to contain the stool and gas. A colostomy may be temporary or permanent, depending on the medical reason for your surgery. After surgery, you will need to learn to care for your ostomy. This includes emptying and changing the colostomy bag as needed. There are many different types of bags or pouching options, including one-piece, two-piece, clear, fabric covered, flat or curved (convex), drainable, and closed or vented options. Your health care provider will help you select the best pouch for you. How to care for the stoma Your stoma should look pink, red, and moist, like the inside of your cheek. It should not be painful to touch, but it may bleed easily when rubbed or bumped. Soon after surgery, the stoma may be swollen, but this goes away within 6 weeks. To care for the stoma: Keep the skin around the stoma clean and dry. Use a clean, soft washcloth to gently wash the stoma and the skin around it. Clean using a circular motion, and wipe away from the stoma opening, nottoward it. Use warm water and only use cleansers, such as mild soap and stoma-safe adhesive remover, recommended by your health care provider. Inspect and dry the skin around the stoma. Use stoma powder or skin protectant on your skin as told by your health care provider. Do not use any other powders, gels, wipes, or creams on the skin around the stoma. These may keep pouch adhesive from sticking or may cause injury to your stoma. Check the stoma area every day for signs of infection or problems. Check for: New or  worsening redness, warmth, swelling, irritation, itching, or pain around the stoma. New or worsening changes to the stoma, such as bleeding, trauma, or a dark and dusky color. Changes to the drainage from the stoma, such as pus, blood, a large amount of liquid drainage, or little to no stool drainage. Measure the stoma opening regularly and record the size. Watch for changes, such as the stoma getting longer, becoming flat, or falling below skin level. (It is normal for the stoma to get smaller as the swelling goes away.) Share this information with your health care provider. Changes in the size or shape of the stoma may require a different style of pouch to be used. How to empty the colostomy bag     Empty your bag at bedtime, before physical activity or sexual relations, and whenever it is one-third to one-half full. Do not let the bag get more than half-full with stool or gas. The bag could leak if it gets too full. Some colostomy bags have a built-in gas release valve that releases gas often throughout the day. Follow these basic steps for emptying a drainable pouch: Prepare the toilet or container: If draining directly into the toilet, put several pieces of toilet paper into the toilet water. This will prevent splashing as you empty the stool into the toilet. Sit far back on the toilet seat. If draining into a container, place a few pieces of moistened toilet paper in the bottom of the  container. This can help when emptying the container. Remove the clip or the hook-and-loop fastener from the tail end of the bag. Unroll the tail, then empty the stool into the toilet or container. Clean the tail with toilet paper or a moist towelette. Reroll the tail, and close it with the clip or the hook-and-loop fastener. Wash your hands. How to change the colostomy bag Change your bag every 3-4 days or as often as told by your health care provider. Also change the bag if it is leaking or separating from  the skin, or if your skin around the stoma looks or feels irritated. Irritated skin may be a sign that the bag is leaking. Always have colostomy supplies with you, and follow these basic steps: Have paper towels or tissues and a trash bag nearby to clean any discharge and dispose of the soiled pouch. Remove the old pouching appliance. Use your fingers, a warm, moist cloth, or a stoma-safe adhesive remover to gently remove the pouch and remove adhesive residue. Clean the stoma area with water or with mild soap and water, as directed. Use water to rinse away any soap. Dry the skin. You may use the cool setting on a hair dryer to do this. Use a tracing pattern (template) to cut the skin barrier to the size needed. The opening should be large enough to fit around the stoma, but you should not see exposed skin. If you are using a two-piece bag, attach the bag and the base to each other. Add the barrier ring, if you use one. If directed, apply stoma powder or skin protectant to the skin. Warm the pouch base with your hands or blow with a hair dryer for 5-10 seconds. Remove the paper from the adhesive backing of the pouch base. Press the adhesive backing onto the skin around the stoma. Gently rub the pouch base onto the skin. This creates heat that helps the barrier to stick. Apply barrier strips to the edges of the pouch, if desired. Dispose of the soiled pouch, and wash your hands. General recommendations Avoid wearing tight clothes or having anything press directly on your stoma or bag. Change your clothing whenever it is soiled or damp. You may shower or bathe with the bag on or off. Do not use harsh or oily soaps or lotions. Dry the skin and bag after bathing. Do not shower or bathe right after changing the pouch. Wait 4-5 hours. Store all supplies in a cool, dry place. Do not leave supplies in extreme heat because some parts can melt or not stick as well. Whenever you leave home, take extra  clothing and an extra ostomy pouch with you. If your bag gets wet, you can dry it with a hair dryer on the cool setting. To prevent odor, you may put drops of ostomy deodorizer in the bag. If recommended by your health care provider, put ostomy lubricant inside the bag. This helps stool slide out of the bag more easily and completely. Contact a health care provider if: You have new or worsening redness, warmth, swelling, irritation, itching, or pain around the stoma. You have new or worsening changes to the stoma, such as bleeding, trauma, or a dark and dusky color. There are changes to the drainage from the stoma, such as pus, blood, a large amount of liquid drainage, or little to no stool drainage. Your stoma extends in or out farther than normal. You need to change your bag every day or have frequent leaks. You  have a fever. Get help right away if: Your stool is bloody. You have nausea or you vomit. You have trouble breathing. These symptoms may be an emergency. Get help right away. Call 911. Do not wait to see if the symptoms will go away. Do not drive yourself to the hospital. Summary Measure your stoma opening regularly and record the size. Watch for changes. Empty your bag at bedtime, before physical activity or sexual relations, and whenever it is one-third to one-half full. Do not let the bag get more than half-full with stool or gas. Change your bag every 3-4 days or as often as told by your health care provider. Whenever you leave home, take extra clothing and an extra ostomy pouch with you. This information is not intended to replace advice given to you by your health care provider. Make sure you discuss any questions you have with your health care provider. Document Revised: 08/31/2021 Document Reviewed: 08/31/2021 Elsevier Patient Education  [employer].

## 2022-08-01 NOTE — Progress Notes (Addendum)
[name] POST-OP OFFICE VISIT  08/01/2022  HPI: Calvin [name] is a 71 y.o. male ~6 weeks s/p robotic assisted laparoscopic loop transverse colostomy for recurrent and recalcitrant Ogilvie's Syndrome with Dr Dahlia Byes, which has been complicated by failure to thrive  He presents without caregiver; history limited Patient does appear to be best I have seen in long time Continues to complain of left leg pain; this has been unchanged  No abdominal pain nor distension Ostomy is working well   Vital signs: BP (!) 78/53   Pulse 85   Temp 98 F (36.7 C) (Oral)   Ht 5' 11.5" (1.816 m)   SpO2 98%   BMI 30.52 kg/m    Physical Exam: Constitutional: Well appearing male, NAD; sitting in wheel chair Abdomen: Soft, non-tender, non-distended, no rebound/guarding. Diverting ostomy in upper abdomen; pink; patient; gas in stool in bag  Skin: Laparoscopic incisions are well healed  Assessment/Plan: This is a 71 y.o. 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 Call with questions/concerns  -- Edison Simon, PA-C [facility] 08/01/2022, 3:24 PM M-F: 7am - 4pm

## 2022-08-07 ENCOUNTER — Encounter: Payer: Self-pay | Admitting: Nurse Practitioner

## 2022-08-07 ENCOUNTER — Non-Acute Institutional Stay: Payer: Medicare Other | Admitting: Nurse Practitioner

## 2022-08-07 VITALS — BP 123/78 | HR 69 | Temp 97.7°F | Resp 18 | Wt 193.0 lb

## 2022-08-07 DIAGNOSIS — I639 Cerebral infarction, unspecified: Secondary | ICD-10-CM

## 2022-08-07 DIAGNOSIS — R5381 Other malaise: Secondary | ICD-10-CM

## 2022-08-07 DIAGNOSIS — Z515 Encounter for palliative care: Secondary | ICD-10-CM

## 2022-08-07 NOTE — Progress Notes (Signed)
[facility] Consult Note Telephone: [phone]  Fax: [phone]    Date of encounter: 08/07/22 10:05 PM PATIENT NAME: HERSH MINNEY [place] [id number]   [phone] (home) [phone] (work) DOB: [birth year] MRN: [id number] PRIMARY CARE PROVIDER:   [facility] Betsey Holiday, MD,  [address] [address] [phone] RESPONSIBLE PARTY:      Date of encounter: 08/07/22 10:09 PM PATIENT NAME: DISHON KEHOE [place] [address]   [phone] (home) [phone] (work) DOB: Aug 05, 1951 MRN: [id number] PRIMARY CARE PROVIDER:   [facility]  Betsey Holiday, MD,  [address] [place] [address] [phone]  RESPONSIBLE PARTY:    Contact Information     Name Relation Home Work Mobile   Jones,[name] Daughter   [phone]   APRIL, CARLYON [phone]    Kazuto, Sevey Sister   [phone]      I met face to face with patient in facility. Palliative Care was asked to follow this patient by consultation request of  Ho, Maudry Mayhew, MD /[facility] to address advance care planning and complex medical decision making. This is a follow up visit.                                  ASSESSMENT AND PLAN / RECOMMENDATIONS:  Symptom Management/Plan: 1. Advance Care Planning;  Wishes are for CPR, Full Scope of treatment, abx/IVF for defined period of time; NO feeding tube, MOST form completed  2. Goals of Care: Goals include to maximize quality of life and symptom management. Our advance care planning conversation included a discussion about:    The value and importance of advance care planning  Exploration of personal, cultural or spiritual beliefs that might influence medical decisions  Exploration of goals of care in the event of a sudden injury or illness  Identification and preparation of a healthcare agent  Review  and updating or creation of an advance directive document. 3. Palliative care encounter; Palliative care encounter; Palliative medicine team will continue to support patient, patient's family, and medical team. Visit consisted of counseling and education dealing with the complex and emotionally intense issues of symptom management and palliative care in the setting of serious and potentially life-threatening illness  4. Debility secondary to late onset CVA, continue to encourage mobility, OOB as able, fall precautions; discussed and educated Mr Esbenshade on safety.   Follow up Palliative Care Visit: Palliative care will continue to follow for complex medical decision making, advance care planning, and clarification of goals. Return 4 to 8 weeks or prn.  I spent 36 minutes providing this consultation starting at 1:00 pm. More than 50% of the time in this consultation was spent in counseling and care coordination. PPS: 30%  Chief Complaint: Follow up palliative consult for complex medical decision making, address goals, manage ongoing symptoms  HISTORY OF PRESENT ILLNESS:  TYREEK CLABO is a 71 y.o. year old male  with multiple medical problems including late onset CVA, left sided weakness, chronic pain, atrial flutter, HTN, chronic diastolic heart failure, CAD, COPD, h/o aspiration PNA, h/o sbo, ogilvie syndrome, h/o lleus, dysphagia, neuropathy, seizures, h/o headaches, HLD, cervical spinal stenosis, depression, anxiety, tobacco abuse. Mr Sites resides at [facility]. Mr Granlund requires assistance for mobility, transfers, adl's including bathing, dressing, tray set up. At present  Mr Dorer is lying in bed, appears debilitated, comfortable. No visitors present. Mr Spooner feeds himself pureed food. Mr Agyeman at present is lying in bed, complaining about pureed food. "I want my food to be regular, speech is going to look into seeing if it can be changed". We talked about appetite at length with weight. We  talked about residing at [facility], now transferred to [facility]. We talked about ros, functional abilities. We talked about his daily routine. We talked about quality of life, what brings him joy. We talked about family, social history. Medications, medical goals, poc reviewed. No new changes recommended today. Therapeutic listening, emotional support provided, questions answered. Updated staff, no new changes or recommendations today.   History obtained from review of EMR, discussion with primary team, and interview with family, facility staff/caregiver and/or Mr. Waskey.  I reviewed available labs, medications, imaging, studies and related documents from the EMR.  Records reviewed and summarized above.    ROS 10 point system reviewed all negative except HPI Physical Exam: Constitutional: Painful General: frail appearing, pleasant male EYES: lids intact ENMT: oral mucous membranes moist CV: RRR, no LE edema Pulmonary: Breath sounds clear,  Abdomen: soft and non tender, no ascites, + ostomy MSK: non ambulatory Skin: warm and dry Neuro:  + generalized weakness,  + cognitive impairment Psych: anxious affect, A and Oriented to self, place I met face to face with patient in facility. Palliative Care was asked to follow this patient by consultation request of  Ho, Maudry Mayhew, MD /[facility] to address advance care planning and complex medical decision making. This is a follow up visit.  Thank you for the opportunity to participate in the care of Mr. Stucki. Please call our office at [phone] if we can be of additional assistance.   Lashaunda Schild Ihor Gully, NP

## 2022-08-21 ENCOUNTER — Ambulatory Visit: Payer: Medicare Other | Attending: Cardiology | Admitting: Cardiology

## 2022-08-21 ENCOUNTER — Encounter: Payer: Self-pay | Admitting: Cardiology

## 2022-09-05 ENCOUNTER — Other Ambulatory Visit: Payer: Self-pay | Admitting: Physical Medicine & Rehabilitation

## 2022-09-05 DIAGNOSIS — M542 Cervicalgia: Secondary | ICD-10-CM

## 2022-09-05 DIAGNOSIS — G8929 Other chronic pain: Secondary | ICD-10-CM

## 2022-09-05 DIAGNOSIS — M5442 Lumbago with sciatica, left side: Secondary | ICD-10-CM

## 2022-09-06 ENCOUNTER — Other Ambulatory Visit: Payer: Self-pay | Admitting: Adult Health Nurse Practitioner

## 2022-09-06 ENCOUNTER — Other Ambulatory Visit: Payer: Self-pay | Admitting: Internal Medicine

## 2022-09-06 DIAGNOSIS — R1310 Dysphagia, unspecified: Secondary | ICD-10-CM

## 2022-09-19 ENCOUNTER — Ambulatory Visit: Payer: Commercial Managed Care - HMO | Attending: Adult Health Nurse Practitioner

## 2022-09-27 ENCOUNTER — Encounter: Payer: Self-pay | Admitting: Nurse Practitioner

## 2022-09-27 ENCOUNTER — Non-Acute Institutional Stay: Payer: Medicare Other | Admitting: Nurse Practitioner

## 2022-09-27 DIAGNOSIS — I639 Cerebral infarction, unspecified: Secondary | ICD-10-CM

## 2022-09-27 DIAGNOSIS — R5381 Other malaise: Secondary | ICD-10-CM

## 2022-09-27 DIAGNOSIS — Z515 Encounter for palliative care: Secondary | ICD-10-CM

## 2022-09-27 NOTE — Progress Notes (Addendum)
[facility] Consult Note Telephone: [phone]  Fax: [phone]    Date of encounter: 09/27/22 7:17 PM [name] Calvin [name] [place] [address]   [phone] (home) [phone] (work) DOB: October 22, 1950 MRN: [id number] PRIMARY CARE PROVIDER:    [facility]  RESPONSIBLE PARTY:    Contact Information     Name Relation Home Work Mobile   Calvin [name] Daughter   [phone]   Calvin [name] [phone]    Calvin [name] Sister   [phone]          I met face to face with patient in facility. Palliative Care was asked to follow this patient by consultation request of  Ho, Maudry Mayhew, MD /[facility] to address advance care planning and complex medical decision making. This is a follow up visit.                                  ASSESSMENT AND PLAN / RECOMMENDATIONS:  Symptom Management/Plan: 1. Advance Care Planning;  Wishes are for CPR, Full Scope of treatment, abx/IVF for defined period of time; NO feeding tube, MOST form completed   2. Goals of Care: Goals include to maximize quality of life and symptom management. Our advance care planning conversation included a discussion about:    The value and importance of advance care planning  Exploration of personal, cultural or spiritual beliefs that might influence medical decisions  Exploration of goals of care in the event of a sudden injury or illness  Identification and preparation of a healthcare agent  Review and updating or creation of an advance directive document. 3. Palliative care encounter; Palliative care encounter; Palliative medicine team will continue to support patient, patient's family, and medical team. Visit consisted of counseling and education dealing with the complex and emotionally intense issues of symptom management and palliative care in the setting of serious and potentially life-threatening  illness   4. Debility secondary to late onset CVA, continue to encourage mobility, OOB as able, fall precautions; discussed and educated Calvin [name] on safety. PC f/u visit further discussion monitor trends of appetite, weights, monitor for functional, cognitive decline with chronic disease progression, assess any active symptoms, supportive role.   Follow up Palliative Care Visit: Palliative care will continue to follow for complex medical decision making, advance care planning, and clarification of goals. Return 4 to 8 weeks or prn.   I spent 46 minutes providing this consultation starting at 1:00 pm. More than 50% of the time in this consultation was spent in counseling and care coordination. PPS: 30%   Chief Complaint: Follow up palliative consult for complex medical decision making, address goals, manage ongoing symptoms   HISTORY OF PRESENT ILLNESS:  Calvin [name] is a 72 y.o. year old male  with multiple medical problems including late onset CVA, left sided weakness, chronic pain, atrial flutter, HTN, chronic diastolic heart failure, CAD, COPD, h/o aspiration PNA, h/o sbo, ogilvie syndrome, h/o lleus, dysphagia, neuropathy, seizures, h/o headaches, HLD, cervical spinal stenosis, depression, anxiety, tobacco abuse. Calvin [name] resides at [facility]. Calvin [name] requires assistance for mobility, transfers, adl's including bathing, dressing, tray set up. Purpose of today PC f/u visit further discussion monitor trends of appetite, weights, monitor for functional, cognitive decline with chronic disease progression, assess any active symptoms, supportive role. I visited and observed Calvin [name] lying in bed with  his brother sitting next to him. Calvin [name] is able to answer questions, engaging, though ruminated about wanting to go home. Calvin [name] was irritable at times about having to stay in facility. We talked about functional debility, limitations, limited mobility and requirements for assistance. We talked  about chronic disease with progression. We talked about ros, reviewed medical goals, poc. We talked about challenges residing in facility with coping strategies. Supportive role. Updated staff, no changes to poc.  Medications, medical goals, poc reviewed. No new changes recommended today. Therapeutic listening, emotional support provided, questions answered. Updated staff, no new changes or recommendations today.    History obtained from review of EMR, discussion with primary team, and interview with family, facility staff/caregiver and/or Calvin. [name].  I reviewed available labs, medications, imaging, studies and related documents from the EMR.  Records reviewed and summarized above.    Physical Exam: Constitutional: Painful General: frail appearing, pleasant male EYES: lids intact ENMT: oral mucous membranes moist CV: RRR, no LE edema Pulmonary: Breath sounds clear,  Abdomen: soft and non tender, no ascites, + ostomy MSK: non ambulatory Skin: warm and dry Neuro:  + generalized weakness,  + cognitive impairment Psych: anxious affect, A and Oriented to self, place Thank you for the opportunity to participate in the care of Calvin. [name]. Please call our office at [phone] if we can be of additional assistance.   Jyra Lagares Ihor Gully, NP

## 2022-10-30 ENCOUNTER — Ambulatory Visit
Admission: RE | Admit: 2022-10-30 | Discharge: 2022-10-30 | Disposition: A | Discharge status: Home / Self Care | Payer: Medicare Other | Source: Ambulatory Visit | Attending: Adult Health Nurse Practitioner | Admitting: Adult Health Nurse Practitioner

## 2022-10-30 DIAGNOSIS — R1310 Dysphagia, unspecified: Secondary | ICD-10-CM | POA: Insufficient documentation

## 2022-10-30 NOTE — Progress Notes (Signed)
Modified Barium Swallow Study  Patient Details  Name: Calvin [name] MRN: [id number] Date of Birth: 1950/09/25  Today's Date: 10/30/2022  Modified Barium Swallow completed.  Full report located under Chart Review in the Imaging Section.  History of Present Illness Calvin [name] is a 72 y.o. male with past medical history including Ogilive's syndrome, seizures, CVA with residual left sided weakness, colon and thyroid cancer s/p radiation (2013), HTN, COPD,  referred for OP MBSS from [facility] to evaluate for potential for diet upgrade (consuming mechanical soft/nectar presently). During acute admission in October 2023, patient had 2 MBSS showing moderate-severe oropharyngeal dysphagia with neuromuscular and cognitive deficits. At time of d/c, a trial diet was recommended: puree and nectar. Calvin [name] requires assistance for mobility, transfers, and ADL's.   Clinical Impression Patient presents with moderate oropharyngeal dyspagia with modest improvements in oral phase compared with prior evaluation, however penetration and eventually aspiration occurs with thin, nectar, and solids. Deficits include slow, prolonged mastication, rocking of the bolus during oral transfer, and disorganized bolus manipulation and lingual control. There is mild diffuse oral residue remaining post-swallow.  Swallow is initiated at the level of the pyriform sinuses with most consistencies. Pharyngeal phase of the swallow is characterized by partial laryngeal elevation, partial hyoid excursion, and partial epiglottic deflection. This results in penetration during the swallow with multiple consistencies. Impaired tongue base retraction and pharyngeal stripping wave contribute to moderate diffuse residue (tongue base, valleculae, pyriforms, posterior pharyngeal wall): this results in laryngeal penetration and eventually aspiration of multiple consistencies (trace-mild amounts), which was largely unsensed. A cued throat clear (vs  cough) was effective in ejecting penetration, however did not clear aspiration, and due to pt's positioning (head forward), this positions his pyriform sinuses directly above open airway, with residue continuing to mix with secretions and trickle into the laryngeal vestibule. Patient is at risk of aspiration regardless of consistency, however, even with chronic deficits he has been consuming PO diet for several months without reported pulmonary complications. Patient expresses desire to eat and drink by mouth vs tube. As pt followed by palliative care, recommend goals of care discussion to aid determining pt's preferences and goals for nutrition/hydration. As thicker liquids did not improve pharyngeal clearance or prevent aspiration, if PO diet is chosen recommend regular liquids. Factors that may increase risk of adverse event in presence of aspiration Calvin [name] & Calvin [name] 2021): Reduced cognitive function  Swallow Evaluation Recommendations Recommendations:  (Goals of care discussion) Liquid Administration via: Spoon;Cup Recommended consults: Consider Palliative care    Deneise Lever, [place], CCC-SLP Speech-Language Pathologist [phone]   Aliene Altes 10/30/2022,6:53 PM

## 2022-12-14 ENCOUNTER — Encounter: Payer: Self-pay | Admitting: Nurse Practitioner

## 2022-12-14 ENCOUNTER — Non-Acute Institutional Stay: Payer: Medicare Other | Admitting: Nurse Practitioner

## 2022-12-14 DIAGNOSIS — Z515 Encounter for palliative care: Secondary | ICD-10-CM

## 2022-12-14 DIAGNOSIS — I639 Cerebral infarction, unspecified: Secondary | ICD-10-CM

## 2022-12-14 DIAGNOSIS — R5381 Other malaise: Secondary | ICD-10-CM

## 2022-12-14 NOTE — Progress Notes (Addendum)
[facility], [facility] Consult Note Telephone: [phone]  Fax: [phone]    Date of encounter: 12/14/22 5:45 PM [name] Dipaola [address] [place] [address]   [phone] (home) [phone] (work) DOB: 1951/03/22 MRN: [id number] PRIMARY CARE PROVIDER:    [facility]  RESPONSIBLE PARTY:    Contact Information     Name Relation Home Work Mobile   Jones,[name] Daughter   [phone]   Lendon KaFOUST,[name] B Brother [phone]    Rijos,Janet Sister   [phone]     I met face to face with patient in facility. Palliative Care was asked to follow this patient by consultation request of  Ho, Gasper LloydJames J, MD /[facility] to address advance care planning and complex medical decision making. This is [name] follow up visit.                                  ASSESSMENT AND PLAN / RECOMMENDATIONS:  Symptom Management/Plan: 1. Advance Care Planning;  Wishes are for CPR, Full Scope of treatment, abx/IVF for defined period of time; NO feeding tube, MOST form completed   2. Palliative care encounter; Palliative care encounter; Palliative medicine team will continue to support patient, patient's family, and medical team. Visit consisted of counseling and education dealing with the complex and emotionally intense issues of symptom management and palliative care in the setting of serious and potentially life-threatening illness   3. Debility secondary to late onset CVA, continue to encourage mobility, OOB as able, fall precautions; discussed and educated Calvin [name] on safety. PC f/u visit further discussion monitor trends of appetite, weights, monitor for functional, cognitive decline with chronic disease progression, assess any active symptoms, supportive role.   Follow up Palliative Care Visit: Palliative care will continue to follow for complex medical decision making, advance care planning, and clarification of goals. Return 4 to 8  weeks or prn.   I spent 45 minutes providing this consultation. More than 50% of the time in this consultation was spent in counseling and care coordination. PPS: 30%   Chief Complaint: Follow up palliative consult for complex medical decision making, address goals, manage ongoing symptoms   HISTORY OF PRESENT ILLNESS:  Calvin [name] Wixted is [name] 72 y.o. year old male  with multiple medical problems including late onset CVA, left sided weakness, chronic pain, atrial flutter, HTN, chronic diastolic heart failure, CAD, COPD, h/o aspiration PNA, h/o sbo, ogilvie syndrome, h/o lleus, dysphagia, neuropathy, seizures, h/o headaches, HLD, cervical spinal stenosis, depression, anxiety, tobacco abuse. Calvin [name] resides at [facility]. Calvin [name] requires assistance for mobility, transfers, adl's including bathing, dressing, tray set up. Purpose of today PC f/u visit further discussion monitor trends of appetite, weights, monitor for functional, cognitive decline with chronic disease progression, assess any active symptoms, supportive role. I visited and observed Calvin [name] lying in bed, appears comfortable. We talked about ros, functional abilities, Calvin [name] endorses he is frustrated with chronic UTI's. We talked about treatment, chronic disease with debility. We talked about colostomy. Encouraged oob, increase mobility; We talked about appetite, nutrition, weights. We talked about daily routine, what brings him joy; Calvin [name] does receive many visitors, support. We talked about role pc in poc. Medications, medical goals, poc reviewed. No new changes recommended today. Therapeutic listening, emotional support provided, questions answered. Updated staff, no new changes or recommendations today. Currently Calvin [name] is stable; PC f/u  visit further discussion monitor trends of appetite, weights, monitor for functional, cognitive decline with chronic disease progression, assess any active symptoms, supportive role.   History  obtained from review of EMR, discussion with primary team, and interview with family, facility staff/caregiver and/or Calvin. Migliaccio.  I reviewed available labs, medications, imaging, studies and related documents from the EMR.  Records reviewed and summarized above.    Physical Exam: General: frail appearing, pleasant male ENMT: oral mucous membranes moist CV: RRR Pulmonary: Breath sounds clear, decreased bases Abdomen: soft and non tender, no ascites, + ostomy MSK: non ambulatory Neuro:  + generalized weakness,  + cognitive impairment Psych: anxious affect, [name] and Oriented to self, place Thank you for the opportunity to participate in the care of Calvin. [name]. Please call our office at [phone] if we can be of additional assistance.   Rieley Khalsa Prince Rome, NP

## 2023-01-08 ENCOUNTER — Non-Acute Institutional Stay: Payer: Medicare Other | Admitting: Nurse Practitioner

## 2023-01-08 DIAGNOSIS — R5381 Other malaise: Secondary | ICD-10-CM

## 2023-01-08 DIAGNOSIS — Z515 Encounter for palliative care: Secondary | ICD-10-CM

## 2023-01-08 DIAGNOSIS — I639 Cerebral infarction, unspecified: Secondary | ICD-10-CM

## 2023-01-08 NOTE — Progress Notes (Signed)
Therapist, nutritional Palliative Care Consult Note Telephone: [phone]  Fax: [phone]    Date of encounter: 01/08/23 9:01 PM PATIENT NAME: MAURY DEININGER [address] [place] [address]   [phone] (home) [phone] (work) DOB: [birth year] MRN: [id number] PRIMARY CARE PROVIDER:    [facility]  RESPONSIBLE PARTY:    Contact Information     Name Relation Home Work Mobile   Jones,[name] Daughter   [phone]   LEONCE, JETT [phone]    Balbuena,Janet Sister   [phone]        I met face to face with patient in facility. Palliative Care was asked to follow this patient by consultation request of  Ho, Gasper Lloyd, MD /[facility] to address advance care planning and complex medical decision making. This is a follow up visit.                                  ASSESSMENT AND PLAN / RECOMMENDATIONS:  Symptom Management/Plan: 1. Advance Care Planning;  Wishes are for CPR, Full Scope of treatment, abx/IVF for defined period of time; NO feeding tube, MOST form completed   2. Palliative care encounter; Palliative care encounter; Palliative medicine team will continue to support patient, patient's family, and medical team. Visit consisted of counseling and education dealing with the complex and emotionally intense issues of symptom management and palliative care in the setting of serious and potentially life-threatening illness   3. Debility secondary to late onset CVA, continue to encourage mobility, OOB as able, fall precautions; discussed and educated Mr Penninger on safety. PC f/u visit further discussion monitor trends of appetite, weights, monitor for functional, cognitive decline with chronic disease progression, assess any active symptoms, supportive role.  01/06/2023 weight 145 lbs   Follow up Palliative Care Visit: Palliative care will continue to follow for complex medical decision making, advance care planning, and  clarification of goals. Return 2 to 8 weeks or prn.   I spent 47 minutes providing this consultation. More than 50% of the time in this consultation was spent in counseling and care coordination. PPS: 30%   Chief Complaint: Follow up palliative consult for complex medical decision making, address goals, manage ongoing symptoms   HISTORY OF PRESENT ILLNESS:  DANON PONTHIEUX is a 72 y.o. year old male  with multiple medical problems including late onset CVA, left sided weakness, chronic pain, atrial flutter, HTN, chronic diastolic heart failure, CAD, COPD, h/o aspiration PNA, h/o sbo, ogilvie syndrome, h/o lleus, dysphagia, neuropathy, seizures, h/o headaches, HLD, cervical spinal stenosis, depression, anxiety, tobacco abuse. Mr Bein resides at [facility]. Mr Gonnerman requires assistance for mobility, transfers, adl's including bathing, dressing, tray set up. Purpose of today PC f/u visit further discussion monitor trends of appetite, weights, monitor for functional, cognitive decline with chronic disease progression, assess any active symptoms, supportive role. I visited and observed Mr Captain lying in bed, appears comfortable. We talked about ros, functional abilities. Encouraged oob, increase mobility; We talked about appetite, nutrition, foods he likes, lengthy discussion. We talked about daily routine, what brings him joy; Limited with cognitive impairment, some delay and ruminating about family, staff, facility, food. We talked about role pc in poc. Medications, medical goals, poc reviewed. No new changes recommended today. Therapeutic listening, emotional support provided, questions answered. Updated staff, no new changes or recommendations today. Currently Mr Mcgrane is stable; PC f/u visit further  discussion monitor trends of appetite, weights, monitor for functional, cognitive decline with chronic disease progression, assess any active symptoms, supportive role.   History obtained from review of  EMR, discussion with primary team, and interview with family, facility staff/caregiver and/or Mr. Stock.  I reviewed available labs, medications, imaging, studies and related documents from the EMR.  Records reviewed and summarized above.    Physical Exam: General: frail appearing, pleasant male ENMT: oral mucous membranes moist CV: RRR Pulmonary: Breath sounds clear Abdomen: soft and non tender, + ostomy MSK: non ambulatory Neuro:  + generalized weakness,  + cognitive impairment Psych: anxious affect, A and Oriented to self, place Thank you for the opportunity to participate in the care of Mr. Westland. Please call our office at [phone] if we can be of additional assistance.   Charne Mcbrien Prince Rome, NP

## 2023-02-26 ENCOUNTER — Other Ambulatory Visit: Payer: Self-pay

## 2023-02-26 ENCOUNTER — Emergency Department
Admission: EM | Admit: 2023-02-26 | Discharge: 2023-02-26 | Disposition: A | Discharge status: Home / Self Care | Payer: Medicare Other | Attending: Emergency Medicine | Admitting: Emergency Medicine

## 2023-02-26 DIAGNOSIS — R079 Chest pain, unspecified: Secondary | ICD-10-CM | POA: Diagnosis present

## 2023-02-26 DIAGNOSIS — I10 Essential (primary) hypertension: Secondary | ICD-10-CM | POA: Insufficient documentation

## 2023-02-26 DIAGNOSIS — I2510 Atherosclerotic heart disease of native coronary artery without angina pectoris: Secondary | ICD-10-CM | POA: Insufficient documentation

## 2023-02-26 DIAGNOSIS — J449 Chronic obstructive pulmonary disease, unspecified: Secondary | ICD-10-CM | POA: Insufficient documentation

## 2023-02-26 DIAGNOSIS — G8929 Other chronic pain: Secondary | ICD-10-CM | POA: Insufficient documentation

## 2023-02-26 HISTORY — DX: Unstable angina: I20.0

## 2023-02-26 HISTORY — DX: Hemiplegia and 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 side: I69.354

## 2023-02-26 HISTORY — DX: Ogilvie syndrome: K59.81

## 2023-02-26 LAB — BASIC METABOLIC PANEL
Anion gap: 6 (ref 5–15)
BUN: 10 mg/dL (ref 8–23)
CO2: 26 mmol/L (ref 22–32)
Calcium: 8.6 mg/dL — ABNORMAL LOW (ref 8.9–10.3)
Chloride: 108 mmol/L (ref 98–111)
Creatinine, Ser: 1.36 mg/dL — ABNORMAL HIGH (ref 0.61–1.24)
GFR, Estimated: 55 mL/min — ABNORMAL LOW (ref >60)
Glucose, Bld: 100 mg/dL — ABNORMAL HIGH (ref 70–99)
Potassium: 4.7 mmol/L (ref 3.5–5.1)
Sodium: 140 mmol/L (ref 135–145)

## 2023-02-26 LAB — CBC WITH DIFFERENTIAL/PLATELET
Abs Immature Granulocytes: 0.06 10*3/uL (ref 0.00–0.07)
Basophils Absolute: 0 10*3/uL (ref 0.0–0.1)
Basophils Relative: 0 %
Eosinophils Absolute: 0.1 10*3/uL (ref 0.0–0.5)
Eosinophils Relative: 2 %
HCT: 40.7 % (ref 39.0–52.0)
Hemoglobin: 12.5 g/dL — ABNORMAL LOW (ref 13.0–17.0)
Immature Granulocytes: 1 %
Lymphocytes Relative: 31 %
Lymphs Abs: 1.4 10*3/uL (ref 0.7–4.0)
MCH: 29.8 pg (ref 26.0–34.0)
MCHC: 30.7 g/dL (ref 30.0–36.0)
MCV: 96.9 fL (ref 80.0–100.0)
Monocytes Absolute: 0.4 10*3/uL (ref 0.1–1.0)
Monocytes Relative: 9 %
Neutro Abs: 2.6 10*3/uL (ref 1.7–7.7)
Neutrophils Relative %: 57 %
Platelets: 243 10*3/uL (ref 150–400)
RBC: 4.2 MIL/uL — ABNORMAL LOW (ref 4.22–5.81)
RDW: 12.2 % (ref 11.5–15.5)
WBC: 4.5 10*3/uL (ref 4.0–10.5)
nRBC: 0 % (ref 0.0–0.2)

## 2023-02-26 LAB — TROPONIN I (HIGH SENSITIVITY): Troponin I (High Sensitivity): 10 ng/L (ref <18)

## 2023-02-26 NOTE — ED Provider Notes (Signed)
[facility] Provider Note    Event Date/Time   First MD Initiated Contact with Patient 02/26/23 [phone]     (approximate)   History   Chief Complaint: Chest Pain and Weakness   HPI  Calvin [name] is a 72 y.o. male with a history of prior stroke with residual left-sided paresthesia and weakness, hypertension, COPD, CAD who is sent to the ED for evaluation from his residential facility.  Patient reports that he has chronic chest pain which is at his baseline.  No new or worsening chest pain .  He also reports chronic left arm pain and paresthesia which is unchanged over the past many years.  He denies any acute complaints at all and is confused as to why he was sent to the ED for evaluation.  Nurse also contacted the facility healthcare provider who was unaware the patient was being sent to the ED and denies any new concerns.     Physical Exam   Triage Vital Signs: ED Triage Vitals [02/26/23 0034]  Enc Vitals Group     BP 115/69     Pulse Rate (!) 58     Resp 15     Temp 98.2 F (36.8 C)     Temp Source Oral     SpO2 98 %     Weight      Height      Head Circumference      Peak Flow      Pain Score 8     Pain Loc      Pain Edu?      Excl. in GC?     Most recent vital signs: Vitals:   02/26/23 0034  BP: 115/69  Pulse: (!) 58  Resp: 15  Temp: 98.2 F (36.8 C)  SpO2: 98%    General: Awake, no distress.  CV:  Good peripheral perfusion.  Regular rate, normal distal pulses Resp:  Normal effort.  Good auscultation bilaterally Abd:  No distention.  Soft nontender.  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  Normal-appearing stool output. Other:  Cranial nerves III through XII intact.   ED Results / Procedures / Treatments   Labs (all labs ordered are listed, but only abnormal results are displayed) Labs Reviewed  BASIC METABOLIC PANEL - Abnormal; Notable for the following components:      Result Value    Glucose, Bld 100 (*)    Creatinine, Ser 1.36 (*)    Calcium 8.6 (*)    GFR, Estimated 55 (*)    All other components within normal limits  CBC WITH DIFFERENTIAL/PLATELET - Abnormal; Notable for the following components:   RBC 4.20 (*)    Hemoglobin 12.5 (*)    All other components within normal limits  TROPONIN I (HIGH SENSITIVITY)     EKG Interpreted by me Atrial fibrillation, rate of 59.  Left axis, normal intervals.  Normal QRS ST segments and T waves   RADIOLOGY    PROCEDURES:  Procedures   MEDICATIONS ORDERED IN ED: Medications - No data to display   IMPRESSION / MDM / ASSESSMENT AND PLAN / ED COURSE  I reviewed the triage vital signs and the nursing notes.  DDx: electrolyte abnormality, anemia, nstemi, aki, anxiety, chronic pain syndrome, late effects of prior CVA  Patient's presentation is most consistent with exacerbation of chronic illness.  Patient presents with chronic chest pain and chronic left-sided paresthesia.  Patient reports that the symptoms are not  changed from his usual daily experience over the past several years.  Due to his comorbidities, labs were obtained which are unremarkable.  Stable for discharge home.       FINAL CLINICAL IMPRESSION(S) / ED DIAGNOSES   Final diagnoses:  Chronic chest pain     Rx / DC Orders   ED Discharge Orders     None        Note:  This document was prepared using Dragon voice recognition software and may include unintentional dictation errors.   Sharman Cheek, MD 02/26/23 [phone]

## 2023-02-26 NOTE — ED Triage Notes (Signed)
Patient comes by EMS from [facility].  He complains of chest pain and left side weakness/numbness.  Both are chronic.  He does not know why they sent him oujt because everything feels the same as always.  EMS gave 324mg  aspirin and the facility gave 1 nitroglycerin.  Patient takes 3 nitroglycerin pills daily.

## 2023-04-18 ENCOUNTER — Other Ambulatory Visit: Payer: Self-pay | Admitting: Physician Assistant

## 2023-04-18 DIAGNOSIS — R519 Headache, unspecified: Secondary | ICD-10-CM

## 2023-04-18 DIAGNOSIS — M542 Cervicalgia: Secondary | ICD-10-CM

## 2023-04-18 DIAGNOSIS — Z8673 Personal history of transient ischemic attack (TIA), and cerebral infarction without residual deficits: Secondary | ICD-10-CM

## 2023-04-18 DIAGNOSIS — R569 Unspecified convulsions: Secondary | ICD-10-CM

## 2023-04-18 DIAGNOSIS — R200 Anesthesia of skin: Secondary | ICD-10-CM

## 2023-04-18 DIAGNOSIS — M545 Low back pain, unspecified: Secondary | ICD-10-CM

## 2023-04-18 DIAGNOSIS — R531 Weakness: Secondary | ICD-10-CM

## 2023-04-18 DIAGNOSIS — G8929 Other chronic pain: Secondary | ICD-10-CM

## 2023-04-24 ENCOUNTER — Ambulatory Visit
Admission: RE | Admit: 2023-04-24 | Discharge: 2023-04-24 | Disposition: A | Discharge status: Home / Self Care | Payer: Medicare Other | Source: Ambulatory Visit | Attending: Physician Assistant | Admitting: Physician Assistant

## 2023-04-24 ENCOUNTER — Other Ambulatory Visit: Payer: Self-pay | Admitting: Physician Assistant

## 2023-04-24 DIAGNOSIS — R519 Headache, unspecified: Secondary | ICD-10-CM

## 2023-04-24 DIAGNOSIS — Z8673 Personal history of transient ischemic attack (TIA), and cerebral infarction without residual deficits: Secondary | ICD-10-CM

## 2023-04-24 DIAGNOSIS — M545 Low back pain, unspecified: Secondary | ICD-10-CM | POA: Insufficient documentation

## 2023-04-24 DIAGNOSIS — R531 Weakness: Secondary | ICD-10-CM

## 2023-04-24 DIAGNOSIS — M542 Cervicalgia: Secondary | ICD-10-CM

## 2023-04-24 DIAGNOSIS — R200 Anesthesia of skin: Secondary | ICD-10-CM

## 2023-04-24 DIAGNOSIS — R202 Paresthesia of skin: Secondary | ICD-10-CM | POA: Diagnosis present

## 2023-04-24 DIAGNOSIS — R569 Unspecified convulsions: Secondary | ICD-10-CM

## 2023-04-24 DIAGNOSIS — G8929 Other chronic pain: Secondary | ICD-10-CM

## 2023-04-24 MED ORDER — GADOBUTROL 1 MMOL/ML IV SOLN: 9.0000 mL | Freq: Once | INTRAVENOUS | Status: DC | PRN

## 2023-05-04 ENCOUNTER — Other Ambulatory Visit: Payer: Self-pay | Admitting: Physical Medicine & Rehabilitation

## 2023-05-04 DIAGNOSIS — M5412 Radiculopathy, cervical region: Secondary | ICD-10-CM

## 2023-06-03 ENCOUNTER — Other Ambulatory Visit: Payer: Self-pay

## 2023-06-03 ENCOUNTER — Emergency Department: Payer: Medicare Other

## 2023-06-03 ENCOUNTER — Emergency Department
Admission: EM | Admit: 2023-06-03 | Discharge: 2023-06-03 | Disposition: A | Discharge status: Home / Self Care | Payer: Medicare Other | Attending: Emergency Medicine | Admitting: Emergency Medicine

## 2023-06-03 DIAGNOSIS — M792 Neuralgia and neuritis, unspecified: Secondary | ICD-10-CM | POA: Insufficient documentation

## 2023-06-03 DIAGNOSIS — G9389 Other specified disorders of brain: Secondary | ICD-10-CM | POA: Insufficient documentation

## 2023-06-03 DIAGNOSIS — I69352 Hemiplegia and hemiparesis following cerebral infarction affecting left dominant side: Secondary | ICD-10-CM | POA: Diagnosis not present

## 2023-06-03 DIAGNOSIS — G319 Degenerative disease of nervous system, unspecified: Secondary | ICD-10-CM | POA: Diagnosis not present

## 2023-06-03 DIAGNOSIS — R519 Headache, unspecified: Secondary | ICD-10-CM | POA: Insufficient documentation

## 2023-06-03 DIAGNOSIS — R200 Anesthesia of skin: Secondary | ICD-10-CM | POA: Diagnosis not present

## 2023-06-03 DIAGNOSIS — I10 Essential (primary) hypertension: Secondary | ICD-10-CM | POA: Insufficient documentation

## 2023-06-03 DIAGNOSIS — J449 Chronic obstructive pulmonary disease, unspecified: Secondary | ICD-10-CM | POA: Insufficient documentation

## 2023-06-03 LAB — COMPREHENSIVE METABOLIC PANEL
ALT: 10 U/L (ref 0–44)
AST: 13 U/L — ABNORMAL LOW (ref 15–41)
Albumin: 3.6 g/dL (ref 3.5–5.0)
Alkaline Phosphatase: 56 U/L (ref 38–126)
Anion gap: 6 (ref 5–15)
BUN: 9 mg/dL (ref 8–23)
CO2: 31 mmol/L (ref 22–32)
Calcium: 8.9 mg/dL (ref 8.9–10.3)
Chloride: 105 mmol/L (ref 98–111)
Creatinine, Ser: 1.17 mg/dL (ref 0.61–1.24)
GFR, Estimated: >60 mL/min (ref >60)
Glucose, Bld: 98 mg/dL (ref 70–99)
Potassium: 4.4 mmol/L (ref 3.5–5.1)
Sodium: 142 mmol/L (ref 135–145)
Total Bilirubin: 0.6 mg/dL (ref 0.3–1.2)
Total Protein: 7.1 g/dL (ref 6.5–8.1)

## 2023-06-03 LAB — CBC
HCT: 41 % (ref 39.0–52.0)
Hemoglobin: 12.3 g/dL — ABNORMAL LOW (ref 13.0–17.0)
MCH: 28.6 pg (ref 26.0–34.0)
MCHC: 30 g/dL (ref 30.0–36.0)
MCV: 95.3 fL (ref 80.0–100.0)
Platelets: 172 10*3/uL (ref 150–400)
RBC: 4.3 MIL/uL (ref 4.22–5.81)
RDW: 12.5 % (ref 11.5–15.5)
WBC: 3.5 10*3/uL — ABNORMAL LOW (ref 4.0–10.5)
nRBC: 0 % (ref 0.0–0.2)

## 2023-06-03 MED ORDER — MORPHINE SULFATE (PF) 4 MG/ML IV SOLN
4.0000 mg | Freq: Once | INTRAVENOUS | Status: AC
  Administered 2023-06-03: 4 mg via INTRAVENOUS
  Filled 2023-06-03: qty 1

## 2023-06-03 MED ORDER — HYDROCODONE-ACETAMINOPHEN 5-325 MG PO TABS: 1.0000 | ORAL_TABLET | ORAL | 0 refills | Status: DC | PRN

## 2023-06-03 NOTE — ED Provider Notes (Signed)
[facility] Provider Note    Event Date/Time   First MD Initiated Contact with Patient 06/03/23 1137     (approximate)  History   Chief Complaint: left sided pain  HPI  KINTE TRIM is a 72 y.o. male with a past medical history of anxiety, anemia, COPD, hypertension, prior CVA with left-sided deficits presents to the emergency department for left-sided pain.  According to the patient since his last CVA which she states was 1 to 2 years ago he has been experiencing pain in his left side he states from his head all the way to his toes.  Patient states this has been an ongoing issue times years however it was improving and now more recently he feels like it is getting worse.  Patient came to the emergency department today for evaluation of that pain.  Patient denies any specific chest pain or abdominal pain.  Patient has an ostomy and states good output.  Denies any fever.  No shortness of breath.  Patient does have minimal use of the left arm and leg which she states is unchanged.  Physical Exam   Triage Vital Signs: ED Triage Vitals  Encounter Vitals Group     BP      Systolic BP Percentile      Diastolic BP Percentile      Pulse      Resp      Temp      Temp src      SpO2      Weight      Height      Head Circumference      Peak Flow      Pain Score      Pain Loc      Pain Education      Exclude from Growth Chart     Most recent vital signs: There were no vitals filed for this visit.  General: Awake, no distress.  CV:  Good peripheral perfusion.  Regular rate and rhythm  Resp:  Normal effort.  Equal breath sounds bilaterally.  Abd:  No distention.  Soft, nontender.  No rebound or guarding. Other:  Able to lift the left leg and arm off the bed but has minimal strength beyond that.   ED Results / Procedures / Treatments   EKG  EKG viewed and interpreted by myself shows a sinus rhythm at 58 bpm with a narrow QRS, normal axis, normal  intervals, nonspecific but no concerning ST changes  RADIOLOGY  I have reviewed and interpreted CT head images.  Appears to have a prior right frontal CVA but no significant abnormality or bleed seen on my evaluation. Radiology is read no acute abnormality, chronic findings.   MEDICATIONS ORDERED IN ED: Medications  morphine (PF) 4 MG/ML injection 4 mg (has no administration in time range)     IMPRESSION / MDM / ASSESSMENT AND PLAN / ED COURSE  I reviewed the triage vital signs and the nursing notes.  Patient's presentation is most consistent with acute presentation with potential threat to life or bodily function.  Patient presents to the emergency department for left-sided pain which he relates to his prior CVA.  Patient states he has had this pain for years but it was improving and now he feels like it is getting worse once again.  Denies any significant change today but decided to come today for evaluation.  Patient denies any other complaints at this time.  We will check basic labs  as well as a CT scan of the head.  We will treat pain and continue to closely monitor.  Patient's workup is reassuring no acute finding on CT imaging, CBC is reassuring chemistry reassuring.  Patient is currently eating a lunch tray, states he is feeling much better.  Will discharge with a short course of pain medication have the patient follow-up with neurology for likely neuropathic pain.  Patient agreeable to plan.  FINAL CLINICAL IMPRESSION(S) / ED DIAGNOSES   Neuropathic pain  Note:  This document was prepared using Dragon voice recognition software and may include unintentional dictation errors.   Minna Antis, MD 06/03/23 1426

## 2023-06-03 NOTE — Discharge Instructions (Addendum)
Please take your pain medication as needed but only as prescribed.  Please call the number provided for neurology to arrange a follow-up appointment to discuss your left-sided pain as this is very likely related to your past stroke.  Return to the emergency department for any worsening pain or any other symptom personally concerning to yourself.

## 2023-06-03 NOTE — ED Notes (Signed)
[facility] oncall rep called this RN and given update

## 2023-06-03 NOTE — ED Notes (Signed)
NP from SNF called for update.

## 2023-06-03 NOTE — ED Notes (Signed)
Pt given food and drink with MD approval

## 2023-06-03 NOTE — ED Triage Notes (Signed)
Pt from [facility] via [facility] for left sided nerve pain and numbness. Pt states pain is chronic and he gets "shots" for it but has missed some doses d/t it not being scheduled. EMS VS stable

## 2023-06-20 ENCOUNTER — Ambulatory Visit
Admission: RE | Admit: 2023-06-20 | Discharge: 2023-06-20 | Disposition: A | Discharge status: Home / Self Care | Payer: Medicare Other | Source: Ambulatory Visit | Attending: Physical Medicine & Rehabilitation | Admitting: Physical Medicine & Rehabilitation

## 2023-06-20 DIAGNOSIS — M5412 Radiculopathy, cervical region: Secondary | ICD-10-CM

## 2023-06-20 MED ORDER — IOPAMIDOL (ISOVUE-M 300) INJECTION 61%
1.0000 mL | Freq: Once | INTRAMUSCULAR | Status: AC
  Administered 2023-06-20: 1 mL via EPIDURAL

## 2023-06-20 MED ORDER — TRIAMCINOLONE ACETONIDE 40 MG/ML IJ SUSP (RADIOLOGY)
60.0000 mg | Freq: Once | INTRAMUSCULAR | Status: AC
  Administered 2023-06-20: 60 mg via EPIDURAL

## 2023-06-20 NOTE — Discharge Instructions (Signed)
Post Procedure Spinal Discharge Instruction Sheet  You may resume a regular diet and any medications that you routinely take (including pain medications) unless otherwise noted by MD.  No driving day of procedure.  Light activity throughout the rest of the day.  Do not do any strenuous work, exercise, bending or lifting.  The day following the procedure, you can resume normal physical activity but you should refrain from exercising or physical therapy for at least three days thereafter.  You may apply ice to the injection site, 20 minutes on, 20 minutes off, as needed. Do not apply ice directly to skin.    Common Side Effects:  Headaches- take your usual medications as directed by your physician.  Increase your fluid intake.  Caffeinated beverages may be helpful.  Lie flat in bed until your headache resolves.  Restlessness or inability to sleep- you may have trouble sleeping for the next few days.  Ask your referring physician if you need any medication for sleep.  Facial flushing or redness- should subside within a few days.  Increased pain- a temporary increase in pain a day or two following your procedure is not unusual.  Take your pain medication as prescribed by your referring physician.  Leg cramps  Please contact our office at [phone] for the following symptoms: Fever greater than 100 degrees. Headaches unresolved with medication after 2-3 days. Increased swelling, pain, or redness at injection site.   May resume aspirin immediately after injection.  Thank you for visiting [facility] Today!

## 2023-06-22 ENCOUNTER — Emergency Department: Payer: Medicare Other

## 2023-06-22 ENCOUNTER — Encounter: Payer: Self-pay | Admitting: Internal Medicine

## 2023-06-22 ENCOUNTER — Other Ambulatory Visit: Payer: Self-pay

## 2023-06-22 ENCOUNTER — Inpatient Hospital Stay: Payer: Medicare Other

## 2023-06-22 ENCOUNTER — Inpatient Hospital Stay
Admission: EM | Admit: 2023-06-22 | Discharge: 2023-06-26 | DRG: 389 | Disposition: A | Discharge status: Skilled Nursing Facility | Payer: Medicare Other | Source: Skilled Nursing Facility | Attending: Osteopathic Medicine | Admitting: Osteopathic Medicine

## 2023-06-22 DIAGNOSIS — K219 Gastro-esophageal reflux disease without esophagitis: Secondary | ICD-10-CM | POA: Diagnosis present

## 2023-06-22 DIAGNOSIS — Z85038 Personal history of other malignant neoplasm of large intestine: Secondary | ICD-10-CM

## 2023-06-22 DIAGNOSIS — F419 Anxiety disorder, unspecified: Secondary | ICD-10-CM | POA: Diagnosis present

## 2023-06-22 DIAGNOSIS — Z79899 Other long term (current) drug therapy: Secondary | ICD-10-CM | POA: Diagnosis not present

## 2023-06-22 DIAGNOSIS — J449 Chronic obstructive pulmonary disease, unspecified: Secondary | ICD-10-CM | POA: Diagnosis present

## 2023-06-22 DIAGNOSIS — K5981 Ogilvie syndrome: Secondary | ICD-10-CM | POA: Diagnosis present

## 2023-06-22 DIAGNOSIS — Z806 Family history of leukemia: Secondary | ICD-10-CM

## 2023-06-22 DIAGNOSIS — K56609 Unspecified intestinal obstruction, unspecified as to partial versus complete obstruction: Secondary | ICD-10-CM | POA: Diagnosis present

## 2023-06-22 DIAGNOSIS — G40909 Epilepsy, unspecified, not intractable, without status epilepticus: Secondary | ICD-10-CM | POA: Diagnosis present

## 2023-06-22 DIAGNOSIS — I69354 Hemiplegia and 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 side: Secondary | ICD-10-CM | POA: Diagnosis not present

## 2023-06-22 DIAGNOSIS — E785 Hyperlipidemia, unspecified: Secondary | ICD-10-CM | POA: Diagnosis present

## 2023-06-22 DIAGNOSIS — I252 Old myocardial infarction: Secondary | ICD-10-CM

## 2023-06-22 DIAGNOSIS — F1010 Alcohol abuse, uncomplicated: Secondary | ICD-10-CM | POA: Diagnosis present

## 2023-06-22 DIAGNOSIS — F32A Depression, unspecified: Secondary | ICD-10-CM | POA: Diagnosis present

## 2023-06-22 DIAGNOSIS — Z933 Colostomy status: Secondary | ICD-10-CM | POA: Diagnosis not present

## 2023-06-22 DIAGNOSIS — N179 Acute kidney failure, unspecified: Secondary | ICD-10-CM | POA: Diagnosis present

## 2023-06-22 DIAGNOSIS — Z6830 Body mass index (BMI) 30.0-30.9, adult: Secondary | ICD-10-CM | POA: Diagnosis not present

## 2023-06-22 DIAGNOSIS — I130 Hypertensive heart and chronic kidney disease with heart failure and stage 1 through stage 4 chronic kidney disease, or unspecified chronic kidney disease: Secondary | ICD-10-CM | POA: Diagnosis present

## 2023-06-22 DIAGNOSIS — M79602 Pain in left arm: Secondary | ICD-10-CM | POA: Diagnosis not present

## 2023-06-22 DIAGNOSIS — K5652 Intestinal adhesions [bands] with complete obstruction: Secondary | ICD-10-CM | POA: Diagnosis present

## 2023-06-22 DIAGNOSIS — K6389 Other specified diseases of intestine: Secondary | ICD-10-CM | POA: Diagnosis present

## 2023-06-22 DIAGNOSIS — N1831 Chronic kidney disease, stage 3a: Secondary | ICD-10-CM | POA: Diagnosis present

## 2023-06-22 DIAGNOSIS — Z7982 Long term (current) use of aspirin: Secondary | ICD-10-CM | POA: Diagnosis not present

## 2023-06-22 DIAGNOSIS — I2510 Atherosclerotic heart disease of native coronary artery without angina pectoris: Secondary | ICD-10-CM | POA: Diagnosis present

## 2023-06-22 DIAGNOSIS — I5032 Chronic diastolic (congestive) heart failure: Secondary | ICD-10-CM | POA: Diagnosis present

## 2023-06-22 DIAGNOSIS — E669 Obesity, unspecified: Secondary | ICD-10-CM | POA: Diagnosis present

## 2023-06-22 DIAGNOSIS — M109 Gout, unspecified: Secondary | ICD-10-CM | POA: Diagnosis present

## 2023-06-22 DIAGNOSIS — F17200 Nicotine dependence, unspecified, uncomplicated: Secondary | ICD-10-CM | POA: Diagnosis present

## 2023-06-22 DIAGNOSIS — Z8249 Family history of ischemic heart disease and other diseases of the circulatory system: Secondary | ICD-10-CM

## 2023-06-22 LAB — LIPASE, BLOOD: Lipase: 24 U/L (ref 11–51)

## 2023-06-22 LAB — COMPREHENSIVE METABOLIC PANEL
ALT: 14 U/L (ref 0–44)
AST: 16 U/L (ref 15–41)
Albumin: 4.6 g/dL (ref 3.5–5.0)
Alkaline Phosphatase: 69 U/L (ref 38–126)
Anion gap: 12 (ref 5–15)
BUN: 26 mg/dL — ABNORMAL HIGH (ref 8–23)
CO2: 24 mmol/L (ref 22–32)
Calcium: 10 mg/dL (ref 8.9–10.3)
Chloride: 104 mmol/L (ref 98–111)
Creatinine, Ser: 1.42 mg/dL — ABNORMAL HIGH (ref 0.61–1.24)
GFR, Estimated: 52 mL/min — ABNORMAL LOW (ref >60)
Glucose, Bld: 121 mg/dL — ABNORMAL HIGH (ref 70–99)
Potassium: 3.8 mmol/L (ref 3.5–5.1)
Sodium: 140 mmol/L (ref 135–145)
Total Bilirubin: 0.5 mg/dL (ref 0.3–1.2)
Total Protein: 9.3 g/dL — ABNORMAL HIGH (ref 6.5–8.1)

## 2023-06-22 LAB — CBC
HCT: 47 % (ref 39.0–52.0)
Hemoglobin: 14.7 g/dL (ref 13.0–17.0)
MCH: 28.9 pg (ref 26.0–34.0)
MCHC: 31.3 g/dL (ref 30.0–36.0)
MCV: 92.3 fL (ref 80.0–100.0)
Platelets: 245 10*3/uL (ref 150–400)
RBC: 5.09 MIL/uL (ref 4.22–5.81)
RDW: 12.4 % (ref 11.5–15.5)
WBC: 13 10*3/uL — ABNORMAL HIGH (ref 4.0–10.5)
nRBC: 0 % (ref 0.0–0.2)

## 2023-06-22 LAB — LACTIC ACID, PLASMA: Lactic Acid, Venous: 1 mmol/L (ref 0.5–1.9)

## 2023-06-22 MED ORDER — ACETAMINOPHEN 650 MG RE SUPP: 650.0000 mg | Freq: Four times a day (QID) | RECTAL | Status: DC | PRN

## 2023-06-22 MED ORDER — ONDANSETRON HCL 4 MG/2ML IJ SOLN: 4.0000 mg | Freq: Four times a day (QID) | INTRAMUSCULAR | Status: DC | PRN

## 2023-06-22 MED ORDER — PREGABALIN 50 MG PO CAPS
100.0000 mg | ORAL_CAPSULE | Freq: Every day | ORAL | Status: DC
  Administered 2023-06-22: 100 mg via NASOGASTRIC
  Filled 2023-06-22: qty 2

## 2023-06-22 MED ORDER — ONDANSETRON HCL 4 MG/2ML IJ SOLN
4.0000 mg | INTRAMUSCULAR | Status: AC
  Administered 2023-06-22: 4 mg via INTRAVENOUS
  Filled 2023-06-22: qty 2

## 2023-06-22 MED ORDER — ENOXAPARIN SODIUM 60 MG/0.6ML IJ SOSY
0.5000 mg/kg | PREFILLED_SYRINGE | INTRAMUSCULAR | Status: DC
  Administered 2023-06-22 – 2023-06-26 (×5): 50 mg via SUBCUTANEOUS
  Filled 2023-06-22 (×5): qty 0.6

## 2023-06-22 MED ORDER — METRONIDAZOLE 500 MG/100ML IV SOLN
500.0000 mg | Freq: Once | INTRAVENOUS | Status: AC
  Administered 2023-06-22: 500 mg via INTRAVENOUS
  Filled 2023-06-22: qty 100

## 2023-06-22 MED ORDER — HYDRALAZINE HCL 20 MG/ML IJ SOLN: 5.0000 mg | Freq: Four times a day (QID) | INTRAMUSCULAR | Status: DC | PRN

## 2023-06-22 MED ORDER — IOHEXOL 300 MG/ML  SOLN
100.0000 mL | Freq: Once | INTRAMUSCULAR | Status: AC | PRN
  Administered 2023-06-22: 100 mL via INTRAVENOUS

## 2023-06-22 MED ORDER — PANTOPRAZOLE SODIUM 40 MG IV SOLR
40.0000 mg | INTRAVENOUS | Status: DC
  Administered 2023-06-22 – 2023-06-25 (×4): 40 mg via INTRAVENOUS
  Filled 2023-06-22 (×4): qty 10

## 2023-06-22 MED ORDER — ASPIRIN 300 MG RE SUPP
150.0000 mg | Freq: Every day | RECTAL | Status: DC
  Filled 2023-06-22: qty 1

## 2023-06-22 MED ORDER — LAMOTRIGINE 25 MG PO TABS
50.0000 mg | ORAL_TABLET | Freq: Two times a day (BID) | ORAL | Status: DC
  Administered 2023-06-22 – 2023-06-23 (×3): 50 mg via NASOGASTRIC
  Filled 2023-06-22 (×3): qty 2

## 2023-06-22 MED ORDER — SODIUM CHLORIDE 0.9 % IV SOLN: INTRAVENOUS | Status: DC

## 2023-06-22 MED ORDER — SODIUM CHLORIDE 0.9 % IV BOLUS
500.0000 mL | Freq: Once | INTRAVENOUS | Status: AC
  Administered 2023-06-22: 500 mL via INTRAVENOUS

## 2023-06-22 MED ORDER — ACETAMINOPHEN 325 MG PO TABS
650.0000 mg | ORAL_TABLET | Freq: Four times a day (QID) | ORAL | Status: DC | PRN
  Administered 2023-06-23 – 2023-06-25 (×2): 650 mg via ORAL
  Filled 2023-06-22 (×2): qty 2

## 2023-06-22 MED ORDER — NICOTINE 14 MG/24HR TD PT24
14.0000 mg | MEDICATED_PATCH | Freq: Every day | TRANSDERMAL | Status: DC
  Filled 2023-06-22 (×4): qty 1

## 2023-06-22 MED ORDER — METRONIDAZOLE 500 MG/100ML IV SOLN
500.0000 mg | Freq: Two times a day (BID) | INTRAVENOUS | Status: DC
  Administered 2023-06-22: 500 mg via INTRAVENOUS
  Filled 2023-06-22 (×2): qty 100

## 2023-06-22 MED ORDER — LEVETIRACETAM IN NACL 1000 MG/100ML IV SOLN
1000.0000 mg | Freq: Two times a day (BID) | INTRAVENOUS | Status: DC
  Administered 2023-06-22: 1000 mg via INTRAVENOUS
  Filled 2023-06-22 (×3): qty 100

## 2023-06-22 MED ORDER — METOCLOPRAMIDE HCL 5 MG/ML IJ SOLN
5.0000 mg | Freq: Once | INTRAMUSCULAR | Status: AC
  Administered 2023-06-22: 5 mg via INTRAVENOUS
  Filled 2023-06-22: qty 2

## 2023-06-22 MED ORDER — LEVETIRACETAM IN NACL 1000 MG/100ML IV SOLN
1000.0000 mg | INTRAVENOUS | Status: AC
  Administered 2023-06-22: 1000 mg via INTRAVENOUS
  Filled 2023-06-22: qty 100

## 2023-06-22 MED ORDER — HYDROMORPHONE HCL 1 MG/ML IJ SOLN
0.5000 mg | INTRAMUSCULAR | Status: DC | PRN
  Administered 2023-06-22 – 2023-06-23 (×3): 0.5 mg via INTRAVENOUS
  Filled 2023-06-22 (×3): qty 0.5

## 2023-06-22 MED ORDER — DIATRIZOATE MEGLUMINE & SODIUM 66-10 % PO SOLN
90.0000 mL | Freq: Once | ORAL | Status: AC
  Administered 2023-06-22: 90 mL via NASOGASTRIC

## 2023-06-22 MED ORDER — PREGABALIN 50 MG PO CAPS
50.0000 mg | ORAL_CAPSULE | Freq: Three times a day (TID) | ORAL | Status: DC
Start: 2023-06-22 — End: 2023-06-22

## 2023-06-22 MED ORDER — SODIUM CHLORIDE 0.9 % IV SOLN
2.0000 g | Freq: Once | INTRAVENOUS | Status: AC
  Administered 2023-06-22: 2 g via INTRAVENOUS
  Filled 2023-06-22: qty 12.5

## 2023-06-22 MED ORDER — BISACODYL 10 MG RE SUPP
10.0000 mg | Freq: Every morning | RECTAL | Status: DC
  Filled 2023-06-22 (×2): qty 1

## 2023-06-22 MED ORDER — TIOTROPIUM BROMIDE MONOHYDRATE 18 MCG IN CAPS
18.0000 ug | ORAL_CAPSULE | Freq: Every day | RESPIRATORY_TRACT | Status: DC
  Administered 2023-06-23 – 2023-06-26 (×4): 18 ug via RESPIRATORY_TRACT
  Filled 2023-06-22 (×2): qty 5

## 2023-06-22 MED ORDER — MORPHINE SULFATE (PF) 2 MG/ML IV SOLN
2.0000 mg | Freq: Once | INTRAVENOUS | Status: AC
  Administered 2023-06-22: 2 mg via INTRAVENOUS
  Filled 2023-06-22: qty 1

## 2023-06-22 MED ORDER — PREGABALIN 50 MG PO CAPS
50.0000 mg | ORAL_CAPSULE | ORAL | Status: DC
  Administered 2023-06-22 – 2023-06-23 (×2): 50 mg via NASOGASTRIC
  Filled 2023-06-22 (×2): qty 1

## 2023-06-22 MED ORDER — MORPHINE SULFATE (PF) 4 MG/ML IV SOLN
4.0000 mg | Freq: Once | INTRAVENOUS | Status: AC
  Administered 2023-06-22: 4 mg via INTRAVENOUS
  Filled 2023-06-22: qty 1

## 2023-06-22 MED ORDER — SODIUM CHLORIDE 0.9 % IV SOLN
2.0000 g | INTRAVENOUS | Status: DC
  Administered 2023-06-22: 2 g via INTRAVENOUS
  Filled 2023-06-22 (×2): qty 20

## 2023-06-22 MED ORDER — ENOXAPARIN SODIUM 40 MG/0.4ML IJ SOSY: 40.0000 mg | PREFILLED_SYRINGE | INTRAMUSCULAR | Status: DC

## 2023-06-22 NOTE — TOC Initial Note (Signed)
Transition of Care [facility]) - Initial/Assessment Note    Patient Details  Name: Calvin [name] MRN: [id number] Date of Birth: 1951-02-26  Transition of [facility] CM/SW Contact:    Margarito Liner, LCSW Phone Number: 06/22/2023, 2:53 PM  Clinical Narrative:  Per chart review, patient is a long-term resident at [facility]. CSW confirmed with admissions coordinator and daughter. CSW will follow progress and facilitate return once stable.               Expected Discharge Plan: Skilled Nursing Facility Barriers to Discharge: Continued Medical Work up   Patient Goals and CMS Choice     Choice offered to / list presented to : Adult Children      Expected Discharge Plan and Services     Post Acute Care Choice: Resumption of Svcs/PTA Provider Living arrangements for the past 2 months: Skilled Nursing Facility                                      Prior Living Arrangements/Services Living arrangements for the past 2 months: Skilled Nursing Facility Lives with:: Facility Resident Patient language and need for interpreter reviewed:: Yes Do you feel safe going back to the place where you live?: Yes      Need for Family Participation in Patient Care: Yes (Comment) Care giver support system in place?: Yes (comment)   Criminal Activity/Legal Involvement Pertinent to Current Situation/Hospitalization: No - Comment as needed  Activities of Daily Living      Permission Sought/Granted Permission sought to share information with : Facility Medical sales representative, Family Supports    Share Information with NAME: Georgina Quint  Permission granted to share info w AGENCY: [facility]  Permission granted to share info w Relationship: Daughter  Permission granted to share info w Contact Information: [phone]  Emotional Assessment       Orientation: : Oriented to Self, Oriented to Place, Oriented to  Time, Oriented to Situation Alcohol / Substance Use: Not  Applicable Psych Involvement: No (comment)  Admission diagnosis:  Small bowel obstruction (HCC) [K56.609] SBO (small bowel obstruction) (HCC) [K56.609] Patient Active Problem List   Diagnosis Date Noted   Headache 06/18/2022   Acute kidney injury superimposed on CKD (HCC) 06/17/2022   Dysphagia 06/07/2022   Respiratory distress 06/02/2022   Neuropathy 06/02/2022   Paroxysmal atrial flutter (HCC) 06/01/2022   Aspiration pneumonia (HCC) 06/01/2022   Cervical spinal stenosis 05/31/2022   Chronic diastolic CHF (congestive heart failure) (HCC) 05/28/2022   SBO (small bowel obstruction) (HCC)    Abdominal distention    HLD (hyperlipidemia) 10/16/2021   Nausea vomiting and diarrhea 10/16/2021   Sepsis (HCC) 10/16/2021   Tobacco abuse 10/16/2021   Muscle twitching 08/14/2019   UTI (urinary tract infection) 08/03/2019   Ileus (HCC) 08/03/2019   Hypokalemia 08/03/2019   QT prolongation 08/03/2019   Ogilvie syndrome    Acute abdominal pain 04/03/2019   Left-sided weakness 09/30/2017   Seizures (HCC) 09/30/2017   HTN (hypertension) 09/30/2017   CAD (coronary artery disease) 09/30/2017   COPD (chronic obstructive pulmonary disease) (HCC) 09/30/2017   Depression with anxiety 09/30/2017   PCP:  Lucita Ferrara, MD Pharmacy:   [facility] - [place], [place] - [address] [place] [address] Phone: [phone] Fax: [phone]  [facility] -  (N), [place] - [address]  [place] [address] Phone: [phone] Fax: [phone]     Social Determinants of Health (SDOH) Social History: SDOH Screenings   Food Insecurity: No Food Insecurity (05/29/2022)  Housing: Low Risk  (05/29/2022)  Transportation Needs: No Transportation Needs (05/29/2022)  Utilities: Not At Risk (05/29/2022)  Tobacco Use: Medium Risk (05/28/2023)   Received from [facility]   SDOH  Interventions:     Readmission Risk Interventions    06/30/2022    2:25 PM 06/02/2022   12:02 PM 10/17/2021   10:47 AM  Readmission Risk Prevention Plan  Transportation Screening Complete Complete Complete  PCP or Specialist Appt within 3-5 Days   Complete  Social Work Consult for Recovery Care Planning/Counseling   Complete  Palliative Care Screening   Not Applicable  Medication Review [employer])  Complete Complete  PCP or Specialist appointment within 3-5 days of discharge  Complete   HRI or Home Care Consult Complete Complete   SW Recovery Care/Counseling Consult Complete Complete   Palliative Care Screening Complete Complete   Skilled Nursing Facility Complete Complete

## 2023-06-22 NOTE — Sepsis Progress Note (Addendum)
Elink monitoring for the code sepsis protocol. Secure chat sent to bedside RN, verified antibiotics were given before blood cultures were collected

## 2023-06-22 NOTE — H&P (Signed)
History and Physical    Calvin [name]:[id number] DOB: 01/06/1951 DOA: 06/22/2023  PCP: Lucita Ferrara, MD (Confirm with patient/family/NH records and if not entered, this has to be entered at [facility] point of entry) Patient coming from: SNF  I have personally briefly reviewed patient's old medical records in [facility]  Chief Complaint: Belly hurts, N/V  HPI: Calvin [name] is a 72 y.o. 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  Symptoms started suddenly last night with new onset of constant abdominal pain, on the left side of the periumbilical area, associated with significant decreased of colostomy output and feeling nauseous and vomited several times of bile containing material.  No fever or chills.  ED Course: Afebrile, vital signs stable with pulse 86 and blood pressure 130/90 nonhypoxic.  CT abdomen pelvis showed high-grade SBO with transition set at the left mid abdomen likely adhesion related, although there are signs of portal vein gas and gastric or pneumatosis.  Image was reviewed by general surgeon who recommend no indication for immediate surgical intervention and recommend NG tube and conservative management.  Blood work showed creatinine 1.4 compared to baseline 1.7, WBC 13, hemoglobin 14, bicarb 24.  Patient was given IV fluid bolus and Zosyn in the ED  Review of Systems: As per HPI otherwise 14 point review of systems negative.    Past Medical History:  Diagnosis Date   Alcohol abuse    drinks on weekend   Anemia    Anxiety    Arthritis    Cancer (HCC)    colon,throat   COPD (chronic obstructive pulmonary disease) (HCC)    Coronary artery disease    Depression     Gout    Hemiplegia and 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 side (HCC)    Hypertension    Myocardial infarction (HCC)    Neuromuscular disorder (HCC)    Ogilvie syndrome    Seizures (HCC)    last 6 months ago   Stroke [facility])    multiple  left side weakness   Tremors of nervous system    Unstable angina [facility])     Past Surgical History:  Procedure Laterality Date   CARPAL TUNNEL RELEASE Left 10/19/2015   Procedure: CARPAL TUNNEL RELEASE;  Surgeon: Kennedy Bucker, MD;  Location: [facility] ORS;  Service: Orthopedics;  Laterality: Left;   COLON SURGERY     COLONOSCOPY WITH PROPOFOL N/A 10/26/2021   Procedure: COLONOSCOPY WITH PROPOFOL;  Surgeon: Regis Bill, MD;  Location: [facility] ENDOSCOPY;  Service: Endoscopy;  Laterality: N/A;   COLONOSCOPY WITH PROPOFOL N/A 06/02/2022   Procedure: COLONOSCOPY WITH PROPOFOL;  Surgeon: Regis Bill, MD;  Location: [facility] ENDOSCOPY;  Service: Endoscopy;  Laterality: N/A;   COLONOSCOPY WITH PROPOFOL N/A 06/06/2022   Procedure: COLONOSCOPY WITH PROPOFOL;  Surgeon: Regis Bill, MD;  Location: [facility] ENDOSCOPY;  Service: Endoscopy;  Laterality: N/A;   JOINT REPLACEMENT     left partial hip    THROAT SURGERY  2013   cancer     reports that he has been smoking. He has never used smokeless tobacco. He reports current alcohol use. He reports that he does not use drugs.  No Known Allergies  Family History  Problem Relation Age of Onset  Cancer Mother    Hypertension Father    Leukemia Brother      Prior to Admission medications   Medication Sig Start Date End Date Taking? Authorizing Provider  acetaminophen (TYLENOL) 325 MG tablet Take 650 mg by mouth every 6 (six) hours.    [provider]  acidophilus (RISAQUAD) CAPS capsule Take 1 capsule by mouth daily.    [provider]  ALPRAZolam Prudy Feeler) 0.5 MG tablet Take 1 tablet (0.5 mg total) by mouth daily as needed for anxiety. 06/30/22   Lurene Shadow, MD  alum & mag hydroxide-simeth (MAALOX/MYLANTA) 200-200-20 MG/5ML suspension Take 30 mLs by mouth every 12 (twelve) hours as needed for indigestion or heartburn.    [provider]  aspirin 81 MG chewable tablet Take 81 mg by mouth daily.    [provider]  bisacodyl (DULCOLAX) 10 MG suppository Place 10 mg rectally in the morning.    [provider]  bisacodyl (DULCOLAX) 5 MG EC tablet Take 5 mg by mouth daily.    [provider]  Calcium Carbonate-Vit D-Min (CALCIUM 600+D PLUS MINERALS) 600-400 MG-UNIT TABS Take 1 tablet by mouth 2 (two) times daily.    [provider]  furosemide (LASIX) 20 MG tablet Take 1 tablet (20 mg total) by mouth daily. 11/02/21   Wouk, Wilfred Curtis, MD  gabapentin (NEURONTIN) 100 MG capsule Take 100 mg by mouth in the morning. (Take only with morning 800mg  dose to equal 900mg  total)    [provider]  gabapentin (NEURONTIN) 400 MG capsule Take 2 capsules (800 mg total) by mouth 3 (three) times daily. 10/02/17   Delfino Lovett, MD  HYDROcodone-acetaminophen (NORCO/VICODIN) 5-325 MG tablet Take 1 tablet by mouth every 4 (four) hours as needed. 06/03/23   Minna Antis, MD  lactulose (CHRONULAC) 10 GM/15ML solution Take 20 g by mouth daily.    [provider]  levETIRAcetam (KEPPRA) 100 MG/ML solution Take 1,000 mg by mouth 2 (two) times daily.    [provider]  lisinopril (ZESTRIL) 5 MG tablet Take 1 tablet (5 mg total) by mouth daily. 08/14/19   Dhungel, Nishant, MD  melatonin 3 MG TABS tablet Take 6 mg by mouth at bedtime.    [provider]  metoprolol succinate (TOPROL-XL) 25 MG 24 hr tablet Take 25 mg by mouth daily.    [provider]  nicotine (NICODERM CQ - DOSED IN MG/24 HOURS) 14 mg/24hr patch Place 1 patch (14 mg total) onto the skin daily. 07/01/22   Lurene Shadow, MD  nitroGLYCERIN (NITROSTAT) 0.4 MG SL tablet Place 0.4 mg under the tongue every 5 (five)  minutes as needed for chest pain.    [provider]  Omega-3 1000 MG CAPS Take 1,000 mg by mouth daily.    [provider]  pantoprazole (PROTONIX) 20 MG tablet Take 20 mg by mouth in the morning.    [provider]  polyethylene glycol (MIRALAX / GLYCOLAX) 17 g packet Take 34 g by mouth daily. 06/30/22   Lurene Shadow, MD  senna (SENOKOT) 8.6 MG TABS tablet Take 1 tablet (8.6 mg total) by mouth at bedtime. Patient taking differently: Take 1 tablet by mouth every evening. 09/20/19   Nita Sickle, MD  sertraline (ZOLOFT) 50 MG tablet Take 50 mg by mouth daily.    [provider]  simvastatin (ZOCOR) 10 MG tablet Take 10 mg by mouth at bedtime.     [provider]  tamsulosin (FLOMAX) 0.4 MG CAPS capsule  Take 0.4 mg by mouth every evening.    [provider]  tiotropium (SPIRIVA) 18 MCG inhalation capsule Place 18 mcg into inhaler and inhale daily.    [provider]  Wheat Dextrin (BENEFIBER DRINK MIX PO) Take 5 g by mouth daily.    [provider]    Physical Exam: Vitals:   06/22/23 0700 06/22/23 0730 06/22/23 0855 06/22/23 1015  BP: 120/84 129/82    Pulse: 81 83    Resp: 18 19    Temp:   (!) 96.4 F (35.8 C)   TempSrc:   Axillary   SpO2: 94% 94%    Weight:    98 kg    Constitutional: NAD, calm, comfortable Vitals:   06/22/23 0700 06/22/23 0730 06/22/23 0855 06/22/23 1015  BP: 120/84 129/82    Pulse: 81 83    Resp: 18 19    Temp:   (!) 96.4 F (35.8 C)   TempSrc:   Axillary   SpO2: 94% 94%    Weight:    98 kg   Eyes: PERRL, lids and conjunctivae normal ENMT: Mucous membranes are moist. Posterior pharynx clear of any exudate or lesions.Normal dentition.  Neck: normal, supple, no masses, no thyromegaly Respiratory: clear to auscultation bilaterally, no wheezing, no crackles. Normal respiratory effort. No accessory muscle use.  Cardiovascular: Regular rate and rhythm, no murmurs / rubs / gallops. No  extremity edema. 2+ pedal pulses. No carotid bruits.  Abdomen: Severe tenderness on the left aspect of periumbilical where, some guarding but no rebound, no masses palpated. No hepatosplenomegaly.  Bowel sounds are weak on all 4 quadrants.  Musculoskeletal: no clubbing / cyanosis. No joint deformity upper and lower extremities. Good ROM, no contractures. Normal muscle tone.  Skin: no rashes, lesions, ulcers. No induration Neurologic: CN 2-12 grossly intact. Sensation intact, DTR normal. Strength 5/5 in all 4.  Psychiatric: Normal judgment and insight. Alert and oriented x 3. Normal mood.     Labs on Admission: I have personally reviewed following labs and imaging studies  CBC: Recent Labs  Lab 06/22/23 0409  WBC 13.0*  HGB 14.7  HCT 47.0  MCV 92.3  PLT 245   Basic Metabolic Panel: Recent Labs  Lab 06/22/23 0409  NA 140  K 3.8  CL 104  CO2 24  GLUCOSE 121*  BUN 26*  CREATININE 1.42*  CALCIUM 10.0   GFR: Estimated Creatinine Clearance: 54.5 mL/min (A) (by C-G formula based on SCr of 1.42 mg/dL (H)). Liver Function Tests: Recent Labs  Lab 06/22/23 0409  AST 16  ALT 14  ALKPHOS 69  BILITOT 0.5  PROT 9.3*  ALBUMIN 4.6   Recent Labs  Lab 06/22/23 0409  LIPASE 24   No results for input(s): "AMMONIA" in the last 168 hours. Coagulation Profile: No results for input(s): "INR", "PROTIME" in the last 168 hours. Cardiac Enzymes: No results for input(s): "CKTOTAL", "CKMB", "CKMBINDEX", "TROPONINI" in the last 168 hours. BNP (last 3 results) No results for input(s): "PROBNP" in the last 8760 hours. HbA1C: No results for input(s): "HGBA1C" in the last 72 hours. CBG: No results for input(s): "GLUCAP" in the last 168 hours. Lipid Profile: No results for input(s): "CHOL", "HDL", "LDLCALC", "TRIG", "CHOLHDL", "LDLDIRECT" in the last 72 hours. Thyroid Function Tests: No results for input(s): "TSH", "T4TOTAL", "FREET4", "T3FREE", "THYROIDAB" in the last 72 hours. Anemia  Panel: No results for input(s): "VITAMINB12", "FOLATE", "FERRITIN", "TIBC", "IRON", "RETICCTPCT" in the last 72 hours. Urine analysis:    Component Value  Date/Time   COLORURINE YELLOW 05/28/2022 1511   APPEARANCEUR CLEAR 05/28/2022 1511   APPEARANCEUR Clear 08/14/2014 0933   LABSPEC 1.015 05/28/2022 1511   LABSPEC 1.015 08/14/2014 0933   PHURINE 5.5 05/28/2022 1511   GLUCOSEU NEGATIVE 05/28/2022 1511   GLUCOSEU Negative 08/14/2014 0933   HGBUR TRACE (A) 05/28/2022 1511   BILIRUBINUR NEGATIVE 05/28/2022 1511   BILIRUBINUR Negative 08/14/2014 0933   KETONESUR NEGATIVE 05/28/2022 1511   PROTEINUR 100 (A) 05/28/2022 1511   UROBILINOGEN 0.2 06/12/2010 0103   NITRITE NEGATIVE 05/28/2022 1511   LEUKOCYTESUR TRACE (A) 05/28/2022 1511   LEUKOCYTESUR Negative 08/14/2014 0933    Radiological Exams on Admission: DG Abd Portable 1 View  Result Date: 06/22/2023 CLINICAL DATA:  Advancement of nasogastric tube. EXAM: PORTABLE ABDOMEN - 1 VIEW COMPARISON:  Earlier today FINDINGS: Tip and side port of the enteric tube below the diaphragm in the stomach. Gaseous small bowel distension again seen. Dense barium in the left colon. IMPRESSION: Tip and side port of the enteric tube below the diaphragm in the stomach. Electronically Signed   By: Narda Rutherford M.D.   On: 06/22/2023 09:18   DG Abd Portable 1V-Small Bowel Protocol-Position Verification  Result Date: 06/22/2023 CLINICAL DATA:  Nasogastric tube placement. EXAM: PORTABLE ABDOMEN - 1 VIEW COMPARISON:  CT earlier today FINDINGS: Tip and side port of the enteric tube below the diaphragm in the stomach. Small bowel dilatation in the upper abdomen. Dense barium in the left colon. IMPRESSION: Tip and side port of the enteric tube below the diaphragm in the stomach. Electronically Signed   By: Narda Rutherford M.D.   On: 06/22/2023 09:17   CT ABDOMEN PELVIS W CONTRAST  Result Date: 06/22/2023 CLINICAL DATA:  Bowel obstruction suspected.   Colostomy patient. EXAM: CT ABDOMEN AND PELVIS WITH CONTRAST TECHNIQUE: Multidetector CT imaging of the abdomen and pelvis was performed using the standard protocol following bolus administration of intravenous contrast. RADIATION DOSE REDUCTION: This exam was performed according to the departmental dose-optimization program which includes automated exposure control, adjustment of the mA and/or kV according to patient size and/or use of iterative reconstruction technique. CONTRAST:  OMNIPAQUE IOHEXOL 300 MG/ML  SOLN COMPARISON:  CTs with IV contrast 06/12/2022 and 05/31/2022 FINDINGS: Lower chest: Lung bases again show scattered linear scarring or atelectasis. Chronic moderate elevation right hemidiaphragm. No focal infiltrate. There is mild cardiomegaly. Scattered calcific plaque LAD coronary artery. Small hiatal hernia. Hepatobiliary: Occasional calcified granulomas in the liver. No mass enhancement. Portal venous gas is present in the parenchyma predominantly in the left-greater-than-right lower lobes. There are tiny stones layering in the proximal gallbladder, without wall thickening or biliary dilatation. Pancreas: No abnormality. Spleen: No abnormality. Adrenals/Urinary Tract: Stable bilateral adrenal nodules, 2.8 cm on the left and 2 cm on MRI. Stable in size back to a CTA abdomen and pelvis from 09/30/2017. No follow-up imaging is recommended. Patchy cortical scarring in both kidneys is again noted without mass enhancement, urinary stones or obstruction. The bladder is unremarkable for the degree of distention. Stomach/Bowel: Suspect gastric wall pneumatosis posteriorly. The stomach is dilated with fluid, small-bowel obstruction continuing into the jejunum are multiple upper to mid abdominal segments. The transitional segment is probably in the distal 1/3 of the ileum. There is a sharply angulated transitional segment in the left mid abdomen series 2 axial images 42-46 and on coronal reconstruction  series 5 images 36-40 with likely etiology of adhesive disease, and collapse of the downstream small bowel segments. There is no appreciable  small or large bowel pneumatosis. There is fluid in the ascending and transverse colon and new demonstration of a loop colostomy of the distal transverse colon through the left mid abdominal wall. 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 No overt thickening of the colon is seen. There is dense stool and barium in the descending colon, rectosigmoid surgical anastomosis. Vascular/Lymphatic: Aortic atherosclerosis. No enlarged abdominal or pelvic lymph nodes. Reproductive: No prostatomegaly. Other: No free fluid, free hemorrhage, free air or focal inflammatory process. There is no incarcerated hernia. Subcutaneous air in the anterior left lower abdomen is probably due to a recent injection. Musculoskeletal: There is streak artifact from again noted left hip arthroplasty. Osteopenia and degenerative changes of the spine. Sclerotic lesion left L4 pedicle and transverse process is chronically seen. IMPRESSION: 1. High-grade small-bowel obstruction with transition probably in the distal 1/3 of the ileum, with a sharply angulated transitional segment in the left mid abdomen, likely due to adhesive disease. 2. Portal venous gas in the liver, with suspected gastric wall pneumatosis posteriorly. This can be seen with GI tract ischemia. No pneumatosis is seen in the bowel. 3. Fluid in the ascending and transverse colon but no overt thickening of the colon. 4. 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 5. Aortic and coronary artery atherosclerosis. 6. Cholelithiasis. 7. Stable bilateral adrenal nodules. 8. Stable sclerotic lesion left L4 pedicle and transverse process. 9. Subcutaneous air in the anterior left lower abdomen, probably due to a recent injection. 10. Critical Value/emergent results were called by telephone at the time of interpretation on  06/22/2023 at 6:08 am to provider MARK QUALE , who verbally acknowledged these results. Aortic Atherosclerosis (ICD10-I70.0). Electronically Signed   By: Almira Bar M.D.   On: 06/22/2023 06:08   DG Chest Port 1 View  Result Date: 06/22/2023 CLINICAL DATA:  Abdominal pain EXAM: PORTABLE CHEST 1 VIEW COMPARISON:  07/01/2022 FINDINGS: Normal heart size and mediastinal contours. Chronic elevation of the right diaphragm. No acute infiltrate or edema. No effusion or pneumothorax. No acute osseous findings. IMPRESSION: No active disease. Electronically Signed   By: Tiburcio Pea M.D.   On: 06/22/2023 05:04    EKG: Independently reviewed.  Sinus rhythm, no acute ST changes.  Assessment/Plan Principal Problem:   SBO (small bowel obstruction) (HCC)  (please populate well all problems here in Problem List. (For example, if patient is on BP meds at home and you resume or decide to hold them, it is a problem that needs to be her. Same for CAD, COPD, HLD and so on)  SBO -Secondary to bowel adhesions -NGT in, continuous suction -Daily image and lab -N.p.o., supportive care with pain medication and as needed Zofran -IV fluid -PPI for GI prophylaxis  Question of medical perforation of bowels -CT showed some signs of portal and gas and suspected gastric wall pneumatosis, estimated risk of bowel perforation is high in this patient.  Will continue prophylactic antibiotic coverage.  Blood culture pending  CKD stage IIIa -Euvolemic, creatinine level about his baseline -IV antibiotics, daily lab  HTN Chronic HFpEF -Hold off p.o. BP meds -Start as needed hydralazine -Hold off Lasix  Seizure disorder -Switch Keppra to IV  CAD Stroke -Change p.o. aspirin to suppository -Resume statin once able to take p.o.  COPD -Continue Spiriva and as needed albuterol  Anxiety/depression -P.o. medication on hold, resume SSRI once able to take p.o.  DVT prophylaxis: Lovenox Code Status: Full  code Family Communication: None at bedside Disposition  Plan: Patient is sick with SBO requiring NGT and inpatient surgical consultation, expect more than 2 midnight hospital stays Consults called: General Surgery Admission status: Telemetry admission   Emeline General MD [facility] Pager [phone]  06/22/2023, 10:22 AM

## 2023-06-22 NOTE — Progress Notes (Signed)
CODE SEPSIS - PHARMACY COMMUNICATION  **Broad Spectrum Antibiotics should be administered within 1 hour of Sepsis diagnosis**  Time Code Sepsis Called/Page Received: 7628  Antibiotics Ordered: Cefepime & Flagyl  Time of 1st antibiotic administration: 3151  Otelia Sergeant, PharmD, [facility] 06/22/2023 6:29 AM

## 2023-06-22 NOTE — ED Notes (Addendum)
This RN verbally communicated with Dr. Chipper Herb to order pain medication. Pt requesting lyrica, but pt is NPO due to Bowel obstruction/NPO status, see orders

## 2023-06-22 NOTE — ED Notes (Signed)
Lab at bedside attempting to collect blood cultures, reason for delay in NG Tube

## 2023-06-22 NOTE — Plan of Care (Signed)
  Problem: Education: Goal: Understanding of post-operative needs will improve Outcome: Progressing Goal: Individualized Educational Video(s) Outcome: Progressing   Problem: Clinical Measurements: Goal: Postoperative complications will be avoided or minimized Outcome: Progressing   Problem: Respiratory: Goal: Will regain and/or maintain adequate ventilation Outcome: Progressing   Problem: Education: Goal: Knowledge of General Education information will improve Description: Including pain rating scale, medication(s)/side effects and non-pharmacologic comfort measures Outcome: Progressing   Problem: Health Behavior/Discharge Planning: Goal: Ability to manage health-related needs will improve Outcome: Progressing   Problem: Clinical Measurements: Goal: Ability to maintain clinical measurements within normal limits will improve Outcome: Progressing Goal: Will remain free from infection Outcome: Progressing Goal: Diagnostic test results will improve Outcome: Progressing Goal: Respiratory complications will improve Outcome: Progressing Goal: Cardiovascular complication will be avoided Outcome: Progressing   Problem: Activity: Goal: Risk for activity intolerance will decrease Outcome: Progressing   Problem: Nutrition: Goal: Adequate nutrition will be maintained Outcome: Progressing   Problem: Coping: Goal: Level of anxiety will decrease Outcome: Progressing   Problem: Elimination: Goal: Will not experience complications related to bowel motility Outcome: Progressing Goal: Will not experience complications related to urinary retention Outcome: Progressing   Problem: Pain Managment: Goal: General experience of comfort will improve Outcome: Progressing   Problem: Safety: Goal: Ability to remain free from injury will improve Outcome: Progressing   Problem: Skin Integrity: Goal: Risk for impaired skin integrity will decrease Outcome: Progressing

## 2023-06-22 NOTE — Progress Notes (Signed)
Patient remained stable with no additional complaints.  Mild tenderness to palpation abdomen.  More liquid stool noted in ostomy bag.  Abdomen otherwise remains soft with no guarding.  Will continue to monitor for now.  NG tube is in place with some brown bilious output.  Continue decompression.

## 2023-06-22 NOTE — Progress Notes (Signed)
PHARMACIST - PHYSICIAN COMMUNICATION  CONCERNING:  Enoxaparin (Lovenox) for DVT Prophylaxis    RECOMMENDATION: Patient was prescribed enoxaprin 40mg  q24 hours for VTE prophylaxis.   Filed Weights   06/22/23 1015  Weight: 98 kg (216 lb 0.8 oz)    Body mass index is 30.13 kg/m.  Estimated Creatinine Clearance: 54.5 mL/min (A) (by C-G formula based on SCr of 1.42 mg/dL (H)).   Based on [facility] policy patient is candidate for enoxaparin 0.5mg /kg TBW SQ every 24 hours based on BMI being >30.  DESCRIPTION: Pharmacy has adjusted enoxaparin dose per [facility] policy.  Patient is now receiving enoxaparin 50 mg every 24 hours    Gardner Candle, PharmD, BCPS Clinical Pharmacist 06/22/2023 10:18 AM

## 2023-06-22 NOTE — Plan of Care (Signed)
CHL Tonsillectomy/Adenoidectomy, Postoperative PEDS care plan entered in error.

## 2023-06-22 NOTE — ED Notes (Signed)
Spoke with NP from facility -  The patient states he wasn't feeling well since 2300.  Unrelieved by Zophran - episodes of brown emesis.  Milk of Mag give, fluids, Zophran,   The patient stated he was having SOB after vomiting.  Active bowel sounds to right , absent to the left according to nurse.  Unable to get KUB before patient came in. Georgina Quint [facility]) [phone]  Facility concerned of possible obstruction of the bowel.   Silvio Clayman, NP  Phone: [phone]

## 2023-06-22 NOTE — Consult Note (Signed)
Subjective:   CC: SBO and portal venous gas  HPI:  Calvin [name] is a 72 y.o. male who was consulted by [facility] for issue above.  Symptoms were first noted 1 day ago. Report of emesis and assoicated abdominal pain. Pain is "in stomach", unable to localize.  Dark brown emesis, exacerbated by nothing specific.  Ostomy output minimal on arrival per verbal report, but now is completely full.  Portions of H&P obtained through patient.     Past Medical History:  has a past medical history of Alcohol abuse, Anemia, Anxiety, Arthritis, Cancer (HCC), COPD (chronic obstructive pulmonary disease) (HCC), Coronary artery disease, Depression, Gout, Hemiplegia and 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 side (HCC), Hypertension, Myocardial infarction (HCC), Neuromuscular disorder (HCC), Ogilvie syndrome, Seizures (HCC), Stroke (HCC), Tremors of nervous system, and Unstable angina (HCC).  Past Surgical History:  Past Surgical History:  Procedure Laterality Date   CARPAL TUNNEL RELEASE Left 10/19/2015   Procedure: CARPAL TUNNEL RELEASE;  Surgeon: Kennedy Bucker, MD;  Location: [facility] ORS;  Service: Orthopedics;  Laterality: Left;   COLON SURGERY     COLONOSCOPY WITH PROPOFOL N/A 10/26/2021   Procedure: COLONOSCOPY WITH PROPOFOL;  Surgeon: Regis Bill, MD;  Location: [facility];  Service: Endoscopy;  Laterality: N/A;   COLONOSCOPY WITH PROPOFOL N/A 06/02/2022   Procedure: COLONOSCOPY WITH PROPOFOL;  Surgeon: Regis Bill, MD;  Location: [facility];  Service: Endoscopy;  Laterality: N/A;   COLONOSCOPY WITH PROPOFOL N/A 06/06/2022   Procedure: COLONOSCOPY WITH PROPOFOL;  Surgeon: Regis Bill, MD;  Location: [facility] END[facility];  Service: Endoscopy;  Laterality: N/A;   JOINT REPLACEMENT     left partial hip    THROAT SURGERY  2013   cancer    Family History: family history includes Cancer in his mother; Hypertension in his father; Leukemia in his  brother.  Social History:  reports that he has been smoking. He has never used smokeless tobacco. He reports current alcohol use. He reports that he does not use drugs.  Current Medications:  Prior to Admission medications   Medication Sig Start Date End Date Taking? Authorizing Provider  acetaminophen (TYLENOL) 325 MG tablet Take 650 mg by mouth every 6 (six) hours.    [provider]  acidophilus (RISAQUAD) CAPS capsule Take 1 capsule by mouth daily.    [provider]  ALPRAZolam Prudy Feeler) 0.5 MG tablet Take 1 tablet (0.5 mg total) by mouth daily as needed for anxiety. 06/30/22   Lurene Shadow, MD  alum & mag hydroxide-simeth (MAALOX/MYLANTA) 200-200-20 MG/5ML suspension Take 30 mLs by mouth every 12 (twelve) hours as needed for indigestion or heartburn.    [provider]  aspirin 81 MG chewable tablet Take 81 mg by mouth daily.    [provider]  bisacodyl (DULCOLAX) 10 MG suppository Place 10 mg rectally in the morning.    [provider]  bisacodyl (DULCOLAX) 5 MG EC tablet Take 5 mg by mouth daily.    [provider]  Calcium Carbonate-Vit D-Min (CALCIUM 600+D PLUS MINERALS) 600-400 MG-UNIT TABS Take 1 tablet by mouth 2 (two) times daily.    [provider]  furosemide (LASIX) 20 MG tablet Take 1 tablet (20 mg total) by mouth daily. 11/02/21   Wouk, Wilfred Curtis, MD  gabapentin (NEURONTIN) 100 MG capsule Take 100 mg by mouth in the morning. (Take only with morning 800mg  dose to equal 900mg  total)    [provider]  gabapentin (NEURONTIN) 400 MG capsule Take  2 capsules (800 mg total) by mouth 3 (three) times daily. 10/02/17   Delfino Lovett, MD  HYDROcodone-acetaminophen (NORCO/VICODIN) 5-325 MG tablet Take 1 tablet by mouth every 4 (four) hours as needed. 06/03/23   Minna Antis, MD  lactulose (CHRONULAC) 10 GM/15ML solution Take 20 g by mouth daily.    [provider]  levETIRAcetam (KEPPRA) 100 MG/ML  solution Take 1,000 mg by mouth 2 (two) times daily.    [provider]  lisinopril (ZESTRIL) 5 MG tablet Take 1 tablet (5 mg total) by mouth daily. 08/14/19   Dhungel, Nishant, MD  melatonin 3 MG TABS tablet Take 6 mg by mouth at bedtime.    [provider]  metoprolol succinate (TOPROL-XL) 25 MG 24 hr tablet Take 25 mg by mouth daily.    [provider]  nicotine (NICODERM CQ - DOSED IN MG/24 HOURS) 14 mg/24hr patch Place 1 patch (14 mg total) onto the skin daily. 07/01/22   Lurene Shadow, MD  nitroGLYCERIN (NITROSTAT) 0.4 MG SL tablet Place 0.4 mg under the tongue every 5 (five) minutes as needed for chest pain.    [provider]  Omega-3 1000 MG CAPS Take 1,000 mg by mouth daily.    [provider]  pantoprazole (PROTONIX) 20 MG tablet Take 20 mg by mouth in the morning.    [provider]  polyethylene glycol (MIRALAX / GLYCOLAX) 17 g packet Take 34 g by mouth daily. 06/30/22   Lurene Shadow, MD  senna (SENOKOT) 8.6 MG TABS tablet Take 1 tablet (8.6 mg total) by mouth at bedtime. Patient taking differently: Take 1 tablet by mouth every evening. 09/20/19   Nita Sickle, MD  sertraline (ZOLOFT) 50 MG tablet Take 50 mg by mouth daily.    [provider]  simvastatin (ZOCOR) 10 MG tablet Take 10 mg by mouth at bedtime.     [provider]  tamsulosin (FLOMAX) 0.4 MG CAPS capsule Take 0.4 mg by mouth every evening.    [provider]  tiotropium (SPIRIVA) 18 MCG inhalation capsule Place 18 mcg into inhaler and inhale daily.    [provider]  Wheat Dextrin (BENEFIBER DRINK MIX PO) Take 5 g by mouth daily.    [provider]    Allergies:  Allergies as of 06/22/2023   (No Known Allergies)    ROS:  General: Denies weight loss, weight gain, fatigue, fevers, chills, and night sweats. Eyes: Denies blurry vision, double vision, eye pain, itchy eyes, and tearing. Ears: Denies hearing loss,  earache, and ringing in ears. Nose: Denies sinus pain, congestion, infections, runny nose, and nosebleeds. Mouth/throat: Denies hoarseness, sore throat, bleeding gums, and difficulty swallowing. Heart: Denies chest pain, palpitations, racing heart, irregular heartbeat, leg pain or swelling, and decreased activity tolerance. Respiratory: Denies breathing difficulty, shortness of breath, wheezing, cough, and sputum. GI: Denies change in appetite, heartburn, nausea, vomiting, constipation, diarrhea, and blood in stool. GU: Denies difficulty urinating, pain with urinating, urgency, frequency, blood in urine. Musculoskeletal: Denies joint stiffness, pain, swelling, muscle weakness. Skin: Denies rash, itching, mass, tumors, sores, and boils Neurologic: Denies headache, fainting, dizziness, seizures, numbness, and tingling. Psychiatric: Denies depression, anxiety, difficulty sleeping, and memory loss. Endocrine: Denies heat or cold intolerance, and increased thirst or urination. Blood/lymph: Denies easy bruising, easy bruising, and swollen glands     Objective:     BP 109/76   Pulse 79   Temp 98.4 F (36.9 C) (Oral)   Resp 18   SpO2 95%  Constitutional :  alert, cooperative, appears stated age, and no distress  Lymphatics/Throat:  no asymmetry, masses, or scars  Respiratory:  clear to auscultation bilaterally  Cardiovascular:  regular rate and rhythm  Gastrointestinal: Soft, no guarding, protrubrent but not overtly distended.  Minimal TTP ostomy pink and patent, bag completely full of stool .   Musculoskeletal: Steady movement  Skin: Cool and moist.   Psychiatric: Normal affect, non-agitated, not confused       LABS:     Latest Ref Rng & Units 06/22/2023    4:09 AM 06/03/2023   12:08 PM 02/26/2023    1:43 AM  CMP  Glucose 70 - 99 mg/dL 235  98  573   BUN 8 - 23 mg/dL 26  9  10    Creatinine 0.61 - 1.24 mg/dL 2.20  2.54  2.70   Sodium 135 - 145 mmol/L 140  142  140   Potassium  3.5 - 5.1 mmol/L 3.8  4.4  4.7   Chloride 98 - 111 mmol/L 104  105  108   CO2 22 - 32 mmol/L 24  31  26    Calcium 8.9 - 10.3 mg/dL 62.3  8.9  8.6   Total Protein 6.5 - 8.1 g/dL 9.3  7.1    Total Bilirubin 0.3 - 1.2 mg/dL 0.5  0.6    Alkaline Phos 38 - 126 U/L 69  56    AST 15 - 41 U/L 16  13    ALT 0 - 44 U/L 14  10        Latest Ref Rng & Units 06/22/2023    4:09 AM 06/03/2023   12:08 PM 02/26/2023    1:43 AM  CBC  WBC 4.0 - 10.5 K/uL 13.0  3.5  4.5   Hemoglobin 13.0 - 17.0 g/dL 76.2  83.1  51.7   Hematocrit 39.0 - 52.0 % 47.0  41.0  40.7   Platelets 150 - 400 K/uL 245  172  243     RADS: CLINICAL DATA:  Bowel obstruction suspected.  Colostomy patient.   EXAM: CT ABDOMEN AND PELVIS WITH CONTRAST   TECHNIQUE: Multidetector CT imaging of the abdomen and pelvis was performed using the standard protocol following bolus administration of intravenous contrast.   RADIATION DOSE REDUCTION: This exam was performed according to the departmental dose-optimization program which includes automated exposure control, adjustment of the mA and/or kV according to patient size and/or use of iterative reconstruction technique.   CONTRAST:  OMNIPAQUE IOHEXOL 300 MG/ML  SOLN   COMPARISON:  CTs with IV contrast 06/12/2022 and 05/31/2022   FINDINGS: Lower chest: Lung bases again show scattered linear scarring or atelectasis. Chronic moderate elevation right hemidiaphragm. No focal infiltrate.   There is mild cardiomegaly. Scattered calcific plaque LAD coronary artery. Small hiatal hernia.   Hepatobiliary: Occasional calcified granulomas in the liver. No mass enhancement.   Portal venous gas is present in the parenchyma predominantly in the left-greater-than-right lower lobes.   There are tiny stones layering in the proximal gallbladder, without wall thickening or biliary dilatation.   Pancreas: No abnormality.   Spleen: No abnormality.   Adrenals/Urinary Tract: Stable  bilateral adrenal nodules, 2.8 cm on the left and 2 cm on MRI. Stable in size back to a CTA abdomen and pelvis from 09/30/2017. No follow-up imaging is recommended.   Patchy cortical scarring in both kidneys is again noted without mass enhancement, urinary stones or obstruction. The bladder is unremarkable for the degree of distention.  Stomach/Bowel: Suspect gastric wall pneumatosis posteriorly. The stomach is dilated with fluid, small-bowel obstruction continuing into the jejunum are multiple upper to mid abdominal segments.   The transitional segment is probably in the distal 1/3 of the ileum.   There is a sharply angulated transitional segment in the left mid abdomen series 2 axial images 42-46 and on coronal reconstruction series 5 images 36-40 with likely etiology of adhesive disease, and collapse of the downstream small bowel segments.   There is no appreciable small or large bowel pneumatosis.   There is fluid in the ascending and transverse colon and new demonstration of a loop colostomy of the distal transverse colon through the left mid abdominal wall. 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   No overt thickening of the colon is seen. There is dense stool and barium in the descending colon, rectosigmoid surgical anastomosis.   Vascular/Lymphatic: Aortic atherosclerosis. No enlarged abdominal or pelvic lymph nodes.   Reproductive: No prostatomegaly.   Other: No free fluid, free hemorrhage, free air or focal inflammatory process. There is no incarcerated hernia. Subcutaneous air in the anterior left lower abdomen is probably due to a recent injection.   Musculoskeletal: There is streak artifact from again noted left hip arthroplasty. Osteopenia and degenerative changes of the spine. Sclerotic lesion left L4 pedicle and transverse process is chronically seen.   IMPRESSION: 1. High-grade small-bowel obstruction with transition probably in the distal 1/3 of the ileum,  with a sharply angulated transitional segment in the left mid abdomen, likely due to adhesive disease. 2. Portal venous gas in the liver, with suspected gastric wall pneumatosis posteriorly. This can be seen with GI tract ischemia. No pneumatosis is seen in the bowel. 3. Fluid in the ascending and transverse colon but no overt thickening of the colon. 4. 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 5. Aortic and coronary artery atherosclerosis. 6. Cholelithiasis. 7. Stable bilateral adrenal nodules. 8. Stable sclerotic lesion left L4 pedicle and transverse process. 9. Subcutaneous air in the anterior left lower abdomen, probably due to a recent injection. 10. Critical Value/emergent results were called by telephone at the time of interpretation on 06/22/2023 at 6:08 am to provider MARK QUALE , who verbally acknowledged these results.   Aortic Atherosclerosis (ICD10-I70.0).     Electronically Signed   By: Almira Bar M.D.   On: 06/22/2023 06:08 Assessment:   SBO and portal venous gas and gastric wall pneumatosis as noted above.  Plan:   Clinical exam not consistent with and significant gastric wall ischemia, and absence of pneumoperitoneum despite the presence of portal venous gas is reassuring.  He is a poor surgical candidate for any extensive gastric wall resection.   Ostomy output is large since ED arrival.  Bag emptied at bedside by myself, over measured.  Pt unfortunately still nauseated and evidence of small amoutn of dark brown emesis in bag.  Ostomy ouput maybe distal to the transition point seen on CT.  Recommend NG tube decompression to prevent further stress on compromised gastric wall, and then small bowel follow through study to determine if obstruction has resolved or not.  Case discussed with patient and he verbalized understanding and all questions were answered to the patient's satisfaction. Daughter Calvin [name] also  notified.  She verbalized she is POA and updated her on situation.  She mentioned he is still full code, and this is consistent with latest notes from palliative in system back in May.  Further  monitoring of chronic issues per hospitalist team labs/images/medications/previous chart entries reviewed personally and relevant changes/updates noted above.

## 2023-06-22 NOTE — ED Provider Notes (Signed)
[facility] Provider Note    Event Date/Time   First MD Initiated Contact with Patient 06/22/23 0350     (approximate)   History   Abdominal Pain (Brought in by medic from [facility], complaining of abdominal pain and vomiting. Patient states he has been nauseous since dinner. History of colon cancer, colostomy. The patient presented with abdoinal distention, stoma is pink at this time. )   HPI  Calvin [name] is a 72 y.o. male  COPD, hypertension, prior CVA with left-sided deficits.,  It is a of colon cancer, seizures, hyperlipidemia, currently resides at [facility].  He has a left sided ostomy  Patient reports for a day he has had vomiting.  He has been vomiting up anything he tries to eat, clear fluid.  Also has noticed increased output and clear discharge from his ostomy  He reports his stomach feels like about 8 out of 10 pain.  His whole abdomen feels tense and painful.  No fever.  No rectal discharge   There is been no bloody emesis.  No chest pain.  No shortness of breath.  Physical Exam   Triage Vital Signs: ED Triage Vitals  Encounter Vitals Group     BP 06/22/23 0357 (!) 134/91     Systolic BP Percentile --      Diastolic BP Percentile --      Pulse Rate 06/22/23 0356 86     Resp --      Temp 06/22/23 0356 98.4 F (36.9 C)     Temp Source 06/22/23 0356 Oral     SpO2 06/22/23 0356 93 %     Weight --      Height --      Head Circumference --      Peak [facility] --      Pain Score 06/22/23 0355 9     Pain Loc --      Pain Education --      Exclude from Growth Chart --     Most recent vital signs: Vitals:   06/22/23 0357 06/22/23 0430  BP: (!) 134/91 109/76  Pulse:  79  Resp:  18  Temp:    SpO2:  95%     General: Awake, no distress.  CV:  Good peripheral perfusion.  Normal tones Resp:  Normal effort.  Clear bilateral normal work of breathing Abd:  Mildly distended, tender to palpation throughout.  No frank  rebound or peritonitis but tender throughout.  He is actively vomiting nonbilious, clear to acidic content with food particle.  He is not aspirating and is managing secretions well.  Also from his ostomy he has clearish to greenish nonbloody loose diarrhea-like discharge with food particulate within his ostomy content as well.  Other:  Ostomy is located in the left mid abdomen, pink protruding and patent with loose diarrhea-like discharge.   ED Results / Procedures / Treatments   Labs (all labs ordered are listed, but only abnormal results are displayed) Labs Reviewed  CBC - Abnormal; Notable for the following components:      Result Value   WBC 13.0 (*)    All other components within normal limits  COMPREHENSIVE METABOLIC PANEL - Abnormal; Notable for the following components:   Glucose, Bld 121 (*)    BUN 26 (*)    Creatinine, Ser 1.42 (*)    Total Protein 9.3 (*)    GFR, Estimated 52 (*)    All other components within normal  limits  GASTROINTESTINAL PANEL BY PCR, STOOL (REPLACES STOOL CULTURE)  C DIFFICILE QUICK SCREEN W PCR REFLEX    CULTURE, BLOOD (ROUTINE X 2)  CULTURE, BLOOD (ROUTINE X 2)  LIPASE, BLOOD  LACTIC ACID, PLASMA     EKG  ED interpreted by me at 4 AM heart rate 80 QRS 100 QTc 420.  No prolongation of the QT interval.  No evidence of ischemia   RADIOLOGY  CT imaging interpreted by me as grossly concerning for small bowel obstruction.  Abnormal findings in the right upper quadrant/liver also noted.  Defer to radiology.  Discussed final radiology interpretation with our radiologist as well with critical result  CT ABDOMEN PELVIS W CONTRAST  Result Date: 06/22/2023 CLINICAL DATA:  Bowel obstruction suspected.  Colostomy patient. EXAM: CT ABDOMEN AND PELVIS WITH CONTRAST TECHNIQUE: Multidetector CT imaging of the abdomen and pelvis was performed using the standard protocol following bolus administration of intravenous contrast. RADIATION DOSE REDUCTION: This  exam was performed according to the departmental dose-optimization program which includes automated exposure control, adjustment of the mA and/or kV according to patient size and/or use of iterative reconstruction technique. CONTRAST:  OMNIPAQUE IOHEXOL 300 MG/ML  SOLN COMPARISON:  CTs with IV contrast 06/12/2022 and 05/31/2022 FINDINGS: Lower chest: Lung bases again show scattered linear scarring or atelectasis. Chronic moderate elevation right hemidiaphragm. No focal infiltrate. There is mild cardiomegaly. Scattered calcific plaque LAD coronary artery. Small hiatal hernia. Hepatobiliary: Occasional calcified granulomas in the liver. No mass enhancement. Portal venous gas is present in the parenchyma predominantly in the left-greater-than-right lower lobes. There are tiny stones layering in the proximal gallbladder, without wall thickening or biliary dilatation. Pancreas: No abnormality. Spleen: No abnormality. Adrenals/Urinary Tract: Stable bilateral adrenal nodules, 2.8 cm on the left and 2 cm on MRI. Stable in size back to a CTA abdomen and pelvis from 09/30/2017. No follow-up imaging is recommended. Patchy cortical scarring in both kidneys is again noted without mass enhancement, urinary stones or obstruction. The bladder is unremarkable for the degree of distention. Stomach/Bowel: Suspect gastric wall pneumatosis posteriorly. The stomach is dilated with fluid, small-bowel obstruction continuing into the jejunum are multiple upper to mid abdominal segments. The transitional segment is probably in the distal 1/3 of the ileum. There is a sharply angulated transitional segment in the left mid abdomen series 2 axial images 42-46 and on coronal reconstruction series 5 images 36-40 with likely etiology of adhesive disease, and collapse of the downstream small bowel segments. There is no appreciable small or large bowel pneumatosis. There is fluid in the ascending and transverse colon and new demonstration of a  loop colostomy of the distal transverse colon through the left mid abdominal wall. 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 No overt thickening of the colon is seen. There is dense stool and barium in the descending colon, rectosigmoid surgical anastomosis. Vascular/Lymphatic: Aortic atherosclerosis. No enlarged abdominal or pelvic lymph nodes. Reproductive: No prostatomegaly. Other: No free fluid, free hemorrhage, free air or focal inflammatory process. There is no incarcerated hernia. Subcutaneous air in the anterior left lower abdomen is probably due to a recent injection. Musculoskeletal: There is streak artifact from again noted left hip arthroplasty. Osteopenia and degenerative changes of the spine. Sclerotic lesion left L4 pedicle and transverse process is chronically seen. IMPRESSION: 1. High-grade small-bowel obstruction with transition probably in the distal 1/3 of the ileum, with a sharply angulated transitional segment in the left mid abdomen, likely due to adhesive disease. 2. Portal venous gas in  the liver, with suspected gastric wall pneumatosis posteriorly. This can be seen with GI tract ischemia. No pneumatosis is seen in the bowel. 3. Fluid in the ascending and transverse colon but no overt thickening of the colon. 4. 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 5. Aortic and coronary artery atherosclerosis. 6. Cholelithiasis. 7. Stable bilateral adrenal nodules. 8. Stable sclerotic lesion left L4 pedicle and transverse process. 9. Subcutaneous air in the anterior left lower abdomen, probably due to a recent injection. 10. Critical Value/emergent results were called by telephone at the time of interpretation on 06/22/2023 at 6:08 am to provider Child Campoy , who verbally acknowledged these results. Aortic Atherosclerosis (ICD10-I70.0). Electronically Signed   By: Almira Bar M.D.   On: 06/22/2023 06:08   DG Chest Port 1 View  Result Date: 06/22/2023 CLINICAL DATA:   Abdominal pain EXAM: PORTABLE CHEST 1 VIEW COMPARISON:  07/01/2022 FINDINGS: Normal heart size and mediastinal contours. Chronic elevation of the right diaphragm. No acute infiltrate or edema. No effusion or pneumothorax. No acute osseous findings. IMPRESSION: No active disease. Electronically Signed   By: Tiburcio Pea M.D.   On: 06/22/2023 05:04         PROCEDURES:  Critical Care performed: Yes, see critical care procedure note(s)  Procedures  CRITICAL CARE Performed by: Sharyn Creamer   Total critical care time: 30 minutes  Critical care time was exclusive of separately billable procedures and treating other patients.  Critical care was necessary to treat or prevent imminent or life-threatening deterioration.  Critical care was time spent personally by me on the following activities: development of treatment plan with patient and/or surrogate as well as nursing, discussions with consultants, evaluation of patient's response to treatment, examination of patient, obtaining history from patient or surrogate, ordering and performing treatments and interventions, ordering and review of laboratory studies, ordering and review of radiographic studies, pulse oximetry and re-evaluation of patient's condition.   MEDICATIONS ORDERED IN ED: Medications  ceFEPIme (MAXIPIME) 2 g in sodium chloride 0.9 % 100 mL IVPB (2 g Intravenous New Bag/Given 06/22/23 0622)  metroNIDAZOLE (FLAGYL) IVPB 500 mg (500 mg Intravenous New Bag/Given 06/22/23 0618)  morphine (PF) 4 MG/ML injection 4 mg (4 mg Intravenous Given 06/22/23 0421)  ondansetron (ZOFRAN) injection 4 mg (4 mg Intravenous Given 06/22/23 0421)  sodium chloride 0.9 % bolus 500 mL (500 mLs Intravenous Bolus 06/22/23 0428)  levETIRAcetam (KEPPRA) IVPB 1000 mg/100 mL premix (0 mg Intravenous Stopped 06/22/23 0453)  iohexol (OMNIPAQUE) 300 MG/ML solution 100 mL (100 mLs Intravenous Contrast Given 06/22/23 0503)     IMPRESSION / MDM / ASSESSMENT  AND PLAN / ED COURSE  I reviewed the triage vital signs and the nursing notes.                              Differential diagnosis includes but is not limited to, abdominal perforation, aortic dissection, cholecystitis, appendicitis, diverticulitis, colitis, esophagitis/gastritis, kidney stone, pyelonephritis, urinary tract infection, aortic aneurysm. All are considered in decision and treatment plan. Based upon the patient's presentation and risk factors, plan to proceed with CT imaging to evaluate for acute intra-abdominal finding including possible obstruction colitis gastroenteritis diverticulitis perforation or other complication.  He has a history of complicated abdominal surgery in the past as well.  He has no acute chest pain or shortness of breath.  He has weakness on his left side which is chronic.  He  is awake alert and oriented without neurologic abnormality.   Patient's presentation is most consistent with acute presentation with potential threat to life or bodily function.   The patient is on the cardiac monitor to evaluate for evidence of arrhythmia and/or significant heart rate changes.   Clinical Course as of 06/22/23 1324  Caleen Essex Jun 22, 2023  4010 Dr. Tonna Boehringer aware of surgical consultation and will be following up on it.  He advised he is currently waiting to speak with Dr. Everlene Farrier regarding if Dr. Everlene Farrier would like to admit or consult (prior surgeon).  Anticipate admission, but currently awaiting recommendation from general surgery as to final disposition and admitting service [MQ]    Clinical Course User Index [MQ] Sharyn Creamer, MD   Empiric antibiotic coverage.  Patient does not technically meet sepsis criteria at this time, but given high risk for potential intra-abdominal infection risk and high grade bowel obstruction with air noted within the venous portal system I have ordered broad-spectrum antibiotic coverage pending further  consultation  ----------------------------------------- 6:46 AM on 06/22/2023 ----------------------------------------- Patient reports pain improved but still present.  He is fully awake and alert understands of need for surgical consultation and admission.  Will give additional dose of morphine at this time he appears much more comfortable.  No further emesis.  Dr. Tonna Boehringer advises Dr. Everlene Farrier will see patient in consult this AM.   Ongoing care assigned to Dr. Rosalia Hammers at 715am. Await surgery team recommendations and (thereafter) admission.  Dr. Tonna Boehringer stopped by MD desk, advises hospitalist admit and surgery to consult. Hospitalist admit order placed, Dr, Rosalia Hammers to followup with admit discussion.  FINAL CLINICAL IMPRESSION(S) / ED DIAGNOSES   Final diagnoses:  Small bowel obstruction (HCC)     Rx / DC Orders   ED Discharge Orders     None        Note:  This document was prepared using Dragon voice recognition software and may include unintentional dictation errors.   Sharyn Creamer, MD 06/22/23 [id number]

## 2023-06-22 NOTE — ED Notes (Signed)
Pt cleaned up, new brief placed, linens changed.

## 2023-06-23 ENCOUNTER — Encounter: Payer: Self-pay | Admitting: Internal Medicine

## 2023-06-23 DIAGNOSIS — K56609 Unspecified intestinal obstruction, unspecified as to partial versus complete obstruction: Secondary | ICD-10-CM | POA: Diagnosis not present

## 2023-06-23 LAB — BASIC METABOLIC PANEL
Anion gap: 10 (ref 5–15)
BUN: 22 mg/dL (ref 8–23)
CO2: 24 mmol/L (ref 22–32)
Calcium: 8.5 mg/dL — ABNORMAL LOW (ref 8.9–10.3)
Chloride: 111 mmol/L (ref 98–111)
Creatinine, Ser: 1.21 mg/dL (ref 0.61–1.24)
GFR, Estimated: >60 mL/min (ref >60)
Glucose, Bld: 106 mg/dL — ABNORMAL HIGH (ref 70–99)
Potassium: 3.7 mmol/L (ref 3.5–5.1)
Sodium: 145 mmol/L (ref 135–145)

## 2023-06-23 LAB — CBC
HCT: 41.7 % (ref 39.0–52.0)
Hemoglobin: 13.1 g/dL (ref 13.0–17.0)
MCH: 29 pg (ref 26.0–34.0)
MCHC: 31.4 g/dL (ref 30.0–36.0)
MCV: 92.5 fL (ref 80.0–100.0)
Platelets: 186 10*3/uL (ref 150–400)
RBC: 4.51 MIL/uL (ref 4.22–5.81)
RDW: 12.6 % (ref 11.5–15.5)
WBC: 10.1 10*3/uL (ref 4.0–10.5)
nRBC: 0 % (ref 0.0–0.2)

## 2023-06-23 MED ORDER — LAMOTRIGINE 25 MG PO TABS
50.0000 mg | ORAL_TABLET | Freq: Two times a day (BID) | ORAL | Status: DC
  Administered 2023-06-23 – 2023-06-26 (×6): 50 mg via ORAL
  Filled 2023-06-23 (×6): qty 2

## 2023-06-23 MED ORDER — PREGABALIN 50 MG PO CAPS
100.0000 mg | ORAL_CAPSULE | Freq: Every day | ORAL | Status: DC
  Administered 2023-06-23 – 2023-06-25 (×3): 100 mg via ORAL
  Filled 2023-06-23 (×3): qty 2

## 2023-06-23 MED ORDER — BISACODYL 10 MG RE SUPP: 10.0000 mg | Freq: Every day | RECTAL | Status: DC | PRN

## 2023-06-23 MED ORDER — BACLOFEN 10 MG PO TABS
5.0000 mg | ORAL_TABLET | Freq: Two times a day (BID) | ORAL | Status: DC
  Administered 2023-06-23 – 2023-06-26 (×6): 5 mg via ORAL
  Filled 2023-06-23 (×6): qty 1

## 2023-06-23 MED ORDER — CITALOPRAM HYDROBROMIDE 20 MG PO TABS
10.0000 mg | ORAL_TABLET | Freq: Every day | ORAL | Status: DC
  Administered 2023-06-23 – 2023-06-26 (×4): 10 mg via ORAL
  Filled 2023-06-23 (×4): qty 1

## 2023-06-23 MED ORDER — ASPIRIN 81 MG PO TBEC
81.0000 mg | DELAYED_RELEASE_TABLET | Freq: Every day | ORAL | Status: DC
  Administered 2023-06-24 – 2023-06-26 (×3): 81 mg via ORAL
  Filled 2023-06-23 (×3): qty 1

## 2023-06-23 MED ORDER — LORATADINE 10 MG PO TABS
10.0000 mg | ORAL_TABLET | Freq: Every day | ORAL | Status: DC
  Administered 2023-06-23 – 2023-06-26 (×4): 10 mg via ORAL
  Filled 2023-06-23 (×4): qty 1

## 2023-06-23 MED ORDER — PREGABALIN 50 MG PO CAPS
50.0000 mg | ORAL_CAPSULE | Freq: Two times a day (BID) | ORAL | Status: DC
  Administered 2023-06-23 – 2023-06-26 (×7): 50 mg via ORAL
  Filled 2023-06-23 (×7): qty 1

## 2023-06-23 MED ORDER — GUAIFENESIN ER 600 MG PO TB12
600.0000 mg | ORAL_TABLET | Freq: Two times a day (BID) | ORAL | Status: DC
  Administered 2023-06-23 – 2023-06-26 (×6): 600 mg via ORAL
  Filled 2023-06-23 (×6): qty 1

## 2023-06-23 MED ORDER — PREGABALIN 50 MG PO CAPS: 50.0000 mg | ORAL_CAPSULE | Freq: Three times a day (TID) | ORAL | Status: DC

## 2023-06-23 NOTE — Progress Notes (Signed)
PROGRESS NOTE    Calvin [name]   XBM:[id number] DOB: [birth year]  DOA: 06/22/2023 Date of Service: 06/23/23 which is hospital day 1  PCP: Lucita Ferrara, MD    HPI: Calvin [name] is a 72 y.o. 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   Hospital course / significant events:  10/18: to ED,  CT abdomen pelvis showed high-grade SBO with transition set at the left mid abdomen likely adhesion related, although there are signs of portal vein gas and gastric or pneumatosis. General surgeon recs - no ugent surgical intervention, trial NG tube and conservative management. On Abx.  10/19: NG out this morning, advanced to CLD  Consultants:  General surgery   Procedures/Surgeries: none      ASSESSMENT & PLAN:   SBO Likely Secondary to bowel adhesions NGT removed this morning per gen surg  supportive care with pain medication and as needed Zofran IV fluid can d/c once taking po clears  PPI IV for GI prophylaxis General surgery following    Question of perforation of bowels CT showed some signs of portal and gas and suspected gastric wall pneumatosis estimated risk of bowel perforation is high in this patient.   Can d/c abx coverage given improvement  Blood culture pending   CKD stage IIIa Euvolemic, creatinine level about his baseline Monitor BMP   HTN Chronic HFpEF Restarted po home meds as needed hydralazine   Seizure disorder Switch Keppra IV back to home po meds    CAD Stroke Resume po ASA and statin    COPD Continue Spiriva and as needed albuterol   Anxiety/depression P.o. medication resumed SSRI .    obese based on  BMI: Body mass index is 30.13 kg/m.  Underweight - under 18.5  normal weight - 18.5 to 24.9 overweight - 25 to 29.9 obese - 30 or more   DVT prophylaxis: lovenox  IV fluids: ND 100 mL/h continuous IV fluids  Nutrition: NPO Central lines / invasive devices: NG tube placed 10/18, chronic colostomy   Code Status: FULL CODE ACP documentation reviewed:  MOST on file in [facility] - full code, full scope  TOC needs: TBD Barriers to dispo / significant pending items: resolution SBO             Subjective / Brief ROS:  Patient reports no concerns at this time Denies CP/SOB.  Pain controlled.  Denies new weakness.  Has not had clears yet as of AM.  Reports no concerns w/ urination/defecation.   Family Communication: none at this time will call family this afternoon     Objective Findings:  Vitals:   06/22/23 2129 06/23/23 0418 06/23/23 0913 06/23/23 1404  BP: 134/84 138/81 (!) 164/90 (!) 164/90  Pulse: 66 63 71 71  Resp: 18 19 18 18   Temp: 98.2 F (36.8 C) 98.1 F (36.7 C) (!) 97.2 F (36.2 C) (!) 97.2 F (36.2 C)  TempSrc: Oral Oral  Oral  SpO2: 95% 99% 94%   Weight:    98 kg  Height:    5' 10.98" (1.803 m)    Intake/Output Summary (Last 24 hours) at 06/23/2023 1510 Last data filed at 06/23/2023 0608 Gross per 24 hour  Intake --  Output 1350 ml  Net -1350 ml   Filed Weights   06/22/23 1015 06/23/23 1404  Weight: 98 kg 98 kg    Examination:  Physical Exam       Scheduled Medications:   [START ON 06/24/2023] aspirin EC  81 mg Oral Daily   baclofen  5 mg Oral BID   citalopram  10 mg Oral Daily   enoxaparin (LOVENOX) injection  0.5 mg/kg Subcutaneous Q24H   guaiFENesin  600 mg Oral BID   lamoTRIgine  50 mg Oral BID   loratadine  10 mg Oral Daily   nicotine  14 mg Transdermal Daily   pantoprazole (PROTONIX) IV  40 mg Intravenous Q24H   pregabalin  50-100 mg Oral TID   tiotropium  18 mcg Inhalation Daily    Continuous Infusions:    PRN  Medications:  acetaminophen **OR** acetaminophen, bisacodyl, hydrALAZINE, ondansetron (ZOFRAN) IV  Antimicrobials from admission:  Anti-infectives (From admission, onward)    Start     Dose/Rate Route Frequency Ordered Stop   06/22/23 2000  metroNIDAZOLE (FLAGYL) IVPB 500 mg  Status:  Discontinued        500 mg 100 mL/hr over 60 Minutes Intravenous Every 12 hours 06/22/23 0940 06/23/23 0856   06/22/23 1200  cefTRIAXone (ROCEPHIN) 2 g in sodium chloride 0.9 % 100 mL IVPB  Status:  Discontinued        2 g 200 mL/hr over 30 Minutes Intravenous Every 24 hours 06/22/23 0940 06/23/23 0856   06/22/23 0615  ceFEPIme (MAXIPIME) 2 g in sodium chloride 0.9 % 100 mL IVPB        2 g 200 mL/hr over 30 Minutes Intravenous  Once 06/22/23 0607 06/22/23 0702   06/22/23 0615  metroNIDAZOLE (FLAGYL) IVPB 500 mg        500 mg 100 mL/hr over 60 Minutes Intravenous  Once 06/22/23 8101 06/22/23 7510           Data Reviewed:  I have personally reviewed the following...  CBC: Recent Labs  Lab 06/22/23 0409 06/23/23 0513  WBC 13.0* 10.1  HGB 14.7 13.1  HCT 47.0 41.7  MCV 92.3 92.5  PLT 245 186   Basic Metabolic Panel: Recent Labs  Lab 06/22/23 0409 06/23/23 0513  NA 140 145  K 3.8 3.7  CL 104 111  CO2 24 24  GLUCOSE 121* 106*  BUN 26* 22  CREATININE 1.42* 1.21  CALCIUM 10.0 8.5*   GFR: Estimated Creatinine Clearance: 63.9 mL/min (by C-G formula based on SCr of 1.21 mg/dL). Liver Function Tests: Recent Labs  Lab 06/22/23 0409  AST 16  ALT 14  ALKPHOS 69  BILITOT 0.5  PROT 9.3*  ALBUMIN 4.6   Recent Labs  Lab 06/22/23 0409  LIPASE 24   No results for input(s): "AMMONIA" in the last 168 hours. Coagulation Profile: No results for input(s): "INR", "PROTIME" in the last 168 hours. Cardiac Enzymes: No results for input(s): "CKTOTAL", "CKMB", "CKMBINDEX", "TROPONINI" in the last 168 hours. BNP (last 3 results) No results for input(s): "PROBNP" in the last 8760  hours. HbA1C: No results for input(s): "HGBA1C" in the last 72 hours. CBG: No results for input(s): "GLUCAP" in the last 168 hours. Lipid Profile: No results for input(s): "CHOL", "HDL", "LDLCALC", "TRIG", "CHOLHDL", "LDLDIRECT" in the last 72 hours. Thyroid Function Tests: No results for input(s): "TSH", "T4TOTAL", "FREET4", "T3FREE", "THYROIDAB" in the last 72 hours. Anemia Panel: No results for input(s): "VITAMINB12", "FOLATE", "FERRITIN", "TIBC", "IRON", "RETICCTPCT" in the last 72 hours. Most Recent Urinalysis  On File:     Component Value Date/Time   COLORURINE YELLOW 05/28/2022 1511   APPEARANCEUR CLEAR 05/28/2022 1511   APPEARANCEUR Clear 08/14/2014 0933   LABSPEC 1.015 05/28/2022 1511   LABSPEC 1.015 08/14/2014 0933   PHURINE 5.5 05/28/2022 1511   GLUCOSEU NEGATIVE 05/28/2022 1511   GLUCOSEU Negative 08/14/2014 0933   HGBUR TRACE (A) 05/28/2022 1511   BILIRUBINUR NEGATIVE 05/28/2022 1511   BILIRUBINUR Negative 08/14/2014 0933   KETONESUR NEGATIVE 05/28/2022 1511   PROTEINUR 100 (A) 05/28/2022 1511   UROBILINOGEN 0.2 06/12/2010 0103   NITRITE NEGATIVE 05/28/2022 1511   LEUKOCYTESUR TRACE (A) 05/28/2022 1511   LEUKOCYTESUR Negative 08/14/2014 0933   Sepsis Labs: @LABRCNTIP (procalcitonin:4,lacticidven:4) Microbiology: Recent Results (from the past 240 hour(s))  Blood culture (routine x 2)     Status: None (Preliminary result)   Collection Time: 06/22/23  8:46 AM   Specimen: BLOOD  Result Value Ref Range Status   Specimen Description BLOOD BLOOD RIGHT ARM  Final   Special Requests   Final    BOTTLES DRAWN AEROBIC AND ANAEROBIC Blood Culture adequate volume   Culture   Final    NO GROWTH < 24 HOURS Performed at [facility], [address] [place], [place] [address]    Report Status PENDING  Incomplete  Blood culture (routine x 2)     Status: None (Preliminary result)   Collection Time: 06/22/23  8:46 AM   Specimen: BLOOD  Result Value Ref Range Status    Specimen Description BLOOD BLOOD LEFT ARM  Final   Special Requests   Final    BOTTLES DRAWN AEROBIC ONLY Blood Culture adequate volume   Culture   Final    NO GROWTH < 24 HOURS Performed at [name], [address] [place], [place] [address]    Report Status PENDING  Incomplete      Radiology Studies last 3 days: DG Abd Portable 1V-Small Bowel Obstruction Protocol-initial, 8 hr delay  Result Date: 06/22/2023 CLINICAL DATA:  Small bowel obstruction, 8 hour delay EXAM: PORTABLE ABDOMEN - 1 VIEW COMPARISON:  Radiographs and CT earlier today. FINDINGS: Enteric contrast is seen within the ascending colon. Contrast within the left colon was present on prior CT. Persistent but diminishing gaseous small bowel distension centrally. Tip of the enteric tube may be within the proximal duodenum. IMPRESSION: Enteric contrast in the ascending colon. Persistent bed improving gaseous small bowel distension. Findings are suggestive of partial or resolving small bowel obstruction. Electronically Signed   By: Narda Rutherford M.D.   On: 06/22/2023 21:03   DG Abd Portable 1 View  Result Date: 06/22/2023 CLINICAL DATA:  Advancement of nasogastric tube. EXAM: PORTABLE ABDOMEN - 1 VIEW COMPARISON:  Earlier today FINDINGS: Tip and side port of the enteric tube below the diaphragm in the stomach. Gaseous small bowel distension again seen. Dense barium in the left colon. IMPRESSION: Tip and side port of the enteric tube below the diaphragm in the stomach. Electronically Signed   By: Narda Rutherford M.D.   On: 06/22/2023 09:18   DG Abd Portable 1V-Small Bowel Protocol-Position Verification  Result Date: 06/22/2023 CLINICAL DATA:  Nasogastric tube placement. EXAM: PORTABLE ABDOMEN - 1 VIEW COMPARISON:  CT earlier today FINDINGS: Tip and side port of the enteric tube below the diaphragm in the stomach. Small bowel dilatation in the upper abdomen. Dense barium in the left colon. IMPRESSION: Tip and  side port of the enteric tube below the diaphragm in the stomach. Electronically Signed  By: Narda Rutherford M.D.   On: 06/22/2023 09:17   CT ABDOMEN PELVIS W CONTRAST  Result Date: 06/22/2023 CLINICAL DATA:  Bowel obstruction suspected.  Colostomy patient. EXAM: CT ABDOMEN AND PELVIS WITH CONTRAST TECHNIQUE: Multidetector CT imaging of the abdomen and pelvis was performed using the standard protocol following bolus administration of intravenous contrast. RADIATION DOSE REDUCTION: This exam was performed according to the departmental dose-optimization program which includes automated exposure control, adjustment of the mA and/or kV according to patient size and/or use of iterative reconstruction technique. CONTRAST:  OMNIPAQUE IOHEXOL 300 MG/ML  SOLN COMPARISON:  CTs with IV contrast 06/12/2022 and 05/31/2022 FINDINGS: Lower chest: Lung bases again show scattered linear scarring or atelectasis. Chronic moderate elevation right hemidiaphragm. No focal infiltrate. There is mild cardiomegaly. Scattered calcific plaque LAD coronary artery. Small hiatal hernia. Hepatobiliary: Occasional calcified granulomas in the liver. No mass enhancement. Portal venous gas is present in the parenchyma predominantly in the left-greater-than-right lower lobes. There are tiny stones layering in the proximal gallbladder, without wall thickening or biliary dilatation. Pancreas: No abnormality. Spleen: No abnormality. Adrenals/Urinary Tract: Stable bilateral adrenal nodules, 2.8 cm on the left and 2 cm on MRI. Stable in size back to a CTA abdomen and pelvis from 09/30/2017. No follow-up imaging is recommended. Patchy cortical scarring in both kidneys is again noted without mass enhancement, urinary stones or obstruction. The bladder is unremarkable for the degree of distention. Stomach/Bowel: Suspect gastric wall pneumatosis posteriorly. The stomach is dilated with fluid, small-bowel obstruction continuing into the jejunum are  multiple upper to mid abdominal segments. The transitional segment is probably in the distal 1/3 of the ileum. There is a sharply angulated transitional segment in the left mid abdomen series 2 axial images 42-46 and on coronal reconstruction series 5 images 36-40 with likely etiology of adhesive disease, and collapse of the downstream small bowel segments. There is no appreciable small or large bowel pneumatosis. There is fluid in the ascending and transverse colon and new demonstration of a loop colostomy of the distal transverse colon through the left mid abdominal wall. 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 No overt thickening of the colon is seen. There is dense stool and barium in the descending colon, rectosigmoid surgical anastomosis. Vascular/Lymphatic: Aortic atherosclerosis. No enlarged abdominal or pelvic lymph nodes. Reproductive: No prostatomegaly. Other: No free fluid, free hemorrhage, free air or focal inflammatory process. There is no incarcerated hernia. Subcutaneous air in the anterior left lower abdomen is probably due to a recent injection. Musculoskeletal: There is streak artifact from again noted left hip arthroplasty. Osteopenia and degenerative changes of the spine. Sclerotic lesion left L4 pedicle and transverse process is chronically seen. IMPRESSION: 1. High-grade small-bowel obstruction with transition probably in the distal 1/3 of the ileum, with a sharply angulated transitional segment in the left mid abdomen, likely due to adhesive disease. 2. Portal venous gas in the liver, with suspected gastric wall pneumatosis posteriorly. This can be seen with GI tract ischemia. No pneumatosis is seen in the bowel. 3. Fluid in the ascending and transverse colon but no overt thickening of the colon. 4. 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 5. Aortic and coronary artery atherosclerosis. 6. Cholelithiasis. 7. Stable bilateral adrenal nodules. 8. Stable sclerotic  lesion left L4 pedicle and transverse process. 9. Subcutaneous air in the anterior left lower abdomen, probably due to a recent injection. 10. Critical Value/emergent results were called by telephone at the time of interpretation  on 06/22/2023 at 6:08 am to provider MARK QUALE , who verbally acknowledged these results. Aortic Atherosclerosis (ICD10-I70.0). Electronically Signed   By: Almira Bar M.D.   On: 06/22/2023 06:08   DG Chest Port 1 View  Result Date: 06/22/2023 CLINICAL DATA:  Abdominal pain EXAM: PORTABLE CHEST 1 VIEW COMPARISON:  07/01/2022 FINDINGS: Normal heart size and mediastinal contours. Chronic elevation of the right diaphragm. No acute infiltrate or edema. No effusion or pneumothorax. No acute osseous findings. IMPRESSION: No active disease. Electronically Signed   By: Tiburcio Pea M.D.   On: 06/22/2023 05:04   DG INJECT DIAG/THERA/INC NEEDLE/CATH/PLC EPI/CERV/THOR W/IMG  Result Date: 06/20/2023 CLINICAL DATA:  Cervical radiculopathy. 72 year old man with history of neck pain radiating to the left upper extremity. Cervical spine MRI dated 04/24/2023 demonstrates moderate to severe left neural foraminal narrowing at C5-6 and C6-7. Cervical epidural steroid injection was requested. FLUOROSCOPY: Radiation Exposure Index (as provided by the fluoroscopic device): 1.60 mGy reference air Kerma. Fluoroscopy time: 26 seconds. PROCEDURE: Informed consent was obtained from the patient after a thorough discussion of the procedural risks, benefits and alternatives. All questions were addressed. A time-out was performed prior to initiation of the procedure. CERVICAL EPIDURAL INJECTION An interlaminar approach was performed on the left at C7-T1. A 20 gauge epidural needle was advanced using loss-of-resistance technique. DIAGNOSTIC EPIDURAL INJECTION Injection of Isovue-M 300 shows a good epidural pattern with spread above and below the level of needle placement, primarily on the left. No vascular  opacification is seen. THERAPEUTIC EPIDURAL INJECTION 1.5 ml of Kenalog 40 mixed with 1 ml of 1% Lidocaine and 2 ml of normal saline were then instilled. The procedure was well-tolerated, and the patient was discharged thirty minutes following the injection in good condition. IMPRESSION: Technically successful interlaminar epidural injection on the left at C7-T1. Electronically Signed   By: Orvan Falconer M.D.   On: 06/20/2023 10:36        Sunnie Nielsen, DO [facility] 06/23/2023, 3:10 PM    Dictation software may have been used to generate the above note. Typos may occur and escape review in typed/dictated notes. Please contact Dr Lyn Hollingshead directly for clarity if needed.  Staff may message me via secure chat in Epic  but this may not receive an immediate response,  please page me for urgent matters!  If 7PM-7AM, please contact night coverage [email or web address]

## 2023-06-23 NOTE — Hospital Course (Addendum)
HPI: Calvin [name] is a 72 y.o. 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   Hospital course / significant events:  10/18: to ED,  CT abdomen pelvis showed high-grade SBO with transition set at the left mid abdomen likely adhesion related, although there are signs of portal vein gas and gastric or pneumatosis. General surgeon recs - no ugent surgical intervention, trial NG tube and conservative management. On Abx.  10/19: NG out this morning, advanced to CLD 10/20: advance to FLD. This afternoon rapid response called d/t concern for L arm pain vs weakness, unclear if truly new weakness (already has partial L hemiparesis) suspect pain, pt is poor historian and seems confused by questions to elucidate whether trouble moving d/t pain or weakness, CT head non-acute, continue neuro checks 10/21: arm back at baseline, nonpainful. Still low colostomy output, remain on FLD.   Consultants:  General surgery   Procedures/Surgeries: none      ASSESSMENT & PLAN:   SBO - improved Likely Secondary to bowel adhesions supportive care with pain medication and as needed Zofran Full liquid diet until ostomy output is improving  PPI IV for GI prophylaxis General surgery following    Question of perforation of bowels CT showed some signs of portal and gas and suspected gastric wall pneumatosis estimated risk of bowel perforation is high in this patient.   Can d/c abx coverage given improvement  Blood culture NG   CKD stage IIIa Euvolemic, creatinine level about his baseline Monitor BMP   HTN Chronic HFpEF Restarted po home meds as needed hydralazine    Seizure disorder Switch Keppra IV back to home po meds    CAD Stroke po ASA and statin  See hospital course re: question new weakness, pt's primary complaint is pain w/ difficulty moving arm d/t pain, CT head obtained and non-acute and following day complaints/symptoms resolved    COPD Continue Spiriva and as needed albuterol   Anxiety/depression P.o. medication resumed SSRI .    obese based on BMI: Body mass index is 30.13 kg/m.  Underweight - under 18.5  normal weight - 18.5 to 24.9 overweight - 25 to 29.9 obese - 30 or more   DVT prophylaxis: lovenox  IV fluids: ND 100 mL/h continuous IV fluids  Nutrition: NPO Central lines / invasive devices: NG tube placed 10/18, chronic colostomy   Code Status: FULL CODE ACP documentation reviewed:  MOST on file in [facility] - full code, full scope  TOC needs: TBD Barriers to dispo / significant pending items: resolution SBO

## 2023-06-23 NOTE — Progress Notes (Signed)
Mobility Specialist - Progress Note   06/23/23 1000  Mobility  Activity Refused mobility     Pt declined mobility despite encouragement, reporting headache pain and fatigue. Will attempt another date/time.    Calvin [name] Mobility Specialist 06/23/23, 10:59 AM

## 2023-06-23 NOTE — Plan of Care (Signed)

## 2023-06-23 NOTE — Progress Notes (Signed)
Subjective:  CC: Calvin [name] is a 72 y.o. male  Hospital stay day 1,   portal venous gas and SBO  HPI: No complaints today.  Pain better  ROS:  General: Denies weight loss, weight gain, fatigue, fevers, chills, and night sweats. Heart: Denies chest pain, palpitations, racing heart, irregular heartbeat, leg pain or swelling, and decreased activity tolerance. Respiratory: Denies breathing difficulty, shortness of breath, wheezing, cough, and sputum. GI: Denies change in appetite, heartburn, nausea, vomiting, constipation, diarrhea, and blood in stool. GU: Denies difficulty urinating, pain with urinating, urgency, frequency, blood in urine.   Objective:   Temp:  [97.2 F (36.2 C)-98.2 F (36.8 C)] 97.2 F (36.2 C) (10/19 1404) Pulse Rate:  [63-71] 71 (10/19 1404) Resp:  [18-19] 18 (10/19 1404) BP: (134-164)/(81-90) 164/90 (10/19 1404) SpO2:  [94 %-99 %] 94 % (10/19 0913) Weight:  [98 kg] 98 kg (10/19 1404)     Height: 5' 10.98" (180.3 cm) Weight: 98 kg BMI (Calculated): 30.15   Intake/Output this shift:   Intake/Output Summary (Last 24 hours) at 06/23/2023 1558 Last data filed at 06/23/2023 0608 Gross per 24 hour  Intake --  Output 1350 ml  Net -1350 ml    Constitutional :  alert, cooperative, appears stated age, and no distress  Respiratory:  clear to auscultation bilaterally  Cardiovascular:  regular rate and rhythm  Gastrointestinal: soft, non-tender; bowel sounds normal; no masses,  no organomegaly. Ostomy pink  Skin: Cool and moist.   Psychiatric: Normal affect, non-agitated, not confused       LABS:     Latest Ref Rng & Units 06/23/2023    5:13 AM 06/22/2023    4:09 AM 06/03/2023   12:08 PM  CMP  Glucose 70 - 99 mg/dL 865  784  98   BUN 8 - 23 mg/dL 22  26  9    Creatinine 0.61 - 1.24 mg/dL 6.96  2.95  2.84   Sodium 135 - 145 mmol/L 145  140  142   Potassium 3.5 - 5.1 mmol/L 3.7  3.8  4.4   Chloride 98 - 111 mmol/L 111  104  105   CO2 22 - 32 mmol/L 24   24  31    Calcium 8.9 - 10.3 mg/dL 8.5  13.2  8.9   Total Protein 6.5 - 8.1 g/dL  9.3  7.1   Total Bilirubin 0.3 - 1.2 mg/dL  0.5  0.6   Alkaline Phos 38 - 126 U/L  69  56   AST 15 - 41 U/L  16  13   ALT 0 - 44 U/L  14  10       Latest Ref Rng & Units 06/23/2023    5:13 AM 06/22/2023    4:09 AM 06/03/2023   12:08 PM  CBC  WBC 4.0 - 10.5 K/uL 10.1  13.0  3.5   Hemoglobin 13.0 - 17.0 g/dL 44.0  10.2  72.5   Hematocrit 39.0 - 52.0 % 41.7  47.0  41.0   Platelets 150 - 400 K/uL 186  245  172     RADS: Narrative & Impression  CLINICAL DATA:  Small bowel obstruction, 8 hour delay   EXAM: PORTABLE ABDOMEN - 1 VIEW   COMPARISON:  Radiographs and CT earlier today.   FINDINGS: Enteric contrast is seen within the ascending colon. Contrast within the left colon was present on prior CT. Persistent but diminishing gaseous small bowel distension centrally. Tip of the enteric tube may be within the  proximal duodenum.   IMPRESSION: Enteric contrast in the ascending colon. Persistent bed improving gaseous small bowel distension. Findings are suggestive of partial or resolving small bowel obstruction.     Electronically Signed   By: Narda Rutherford M.D.   On: 06/22/2023 21:03     Assessment:   portal venous gas and SBO  Stable.  Xray as above.  NG removed this am, and tolerating clears now.  Continue clears.  Will advance diet slowly.  labs/images/medications/previous chart entries reviewed personally and relevant changes/updates noted above.

## 2023-06-24 ENCOUNTER — Inpatient Hospital Stay: Payer: Medicare Other

## 2023-06-24 DIAGNOSIS — K56609 Unspecified intestinal obstruction, unspecified as to partial versus complete obstruction: Secondary | ICD-10-CM | POA: Diagnosis not present

## 2023-06-24 LAB — GASTROINTESTINAL PANEL BY PCR, STOOL (REPLACES STOOL CULTURE)

## 2023-06-24 LAB — GLUCOSE, CAPILLARY: Glucose-Capillary: 94 mg/dL (ref 70–99)

## 2023-06-24 MED ORDER — CALCIUM CARBONATE ANTACID 500 MG PO CHEW
1.0000 | CHEWABLE_TABLET | Freq: Three times a day (TID) | ORAL | Status: DC | PRN
  Filled 2023-06-24: qty 1

## 2023-06-24 MED ORDER — CALCIUM CARBONATE ANTACID 500 MG PO CHEW
1.0000 | CHEWABLE_TABLET | Freq: Three times a day (TID) | ORAL | Status: DC
  Administered 2023-06-24: 200 mg via ORAL
  Filled 2023-06-24: qty 1

## 2023-06-24 MED ORDER — OXYCODONE HCL 5 MG PO TABS
5.0000 mg | ORAL_TABLET | ORAL | Status: DC | PRN
  Administered 2023-06-24: 5 mg via ORAL
  Filled 2023-06-24: qty 1

## 2023-06-24 MED ORDER — GUAIFENESIN-DM 100-10 MG/5ML PO SYRP
5.0000 mL | ORAL_SOLUTION | ORAL | Status: DC | PRN
  Administered 2023-06-24 – 2023-06-26 (×2): 5 mL via ORAL
  Filled 2023-06-24 (×2): qty 10

## 2023-06-24 MED ORDER — TRAZODONE HCL 50 MG PO TABS
25.0000 mg | ORAL_TABLET | Freq: Every evening | ORAL | Status: DC | PRN
  Administered 2023-06-24 – 2023-06-25 (×3): 25 mg via ORAL
  Filled 2023-06-24 (×3): qty 1

## 2023-06-24 NOTE — Progress Notes (Signed)
PROGRESS NOTE    Calvin [name]   JYN:[id number] DOB: 1951/04/19  DOA: 06/22/2023 Date of Service: 06/24/23 which is hospital day 2  PCP: Lucita Ferrara, MD    HPI: Calvin [name] is a 72 y.o. 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   Hospital course / significant events:  10/18: to ED,  CT abdomen pelvis showed high-grade SBO with transition set at the left mid abdomen likely adhesion related, although there are signs of portal vein gas and gastric or pneumatosis. General surgeon recs - no ugent surgical intervention, trial NG tube and conservative management. On Abx.  10/19: NG out this morning, advanced to CLD 10/20: advance to FLD. This afternoon rapid response called d/t concern for L arm pain vs weakness, unclear if truly new weakness (already has partial L hemiparesis) suspect pain, pt is poor historian and seems confused by questions to elucidate whether trouble moving d/t pain or weakness, CT head non-acute, continue neuro checks  Consultants:  General surgery   Procedures/Surgeries: none      ASSESSMENT & PLAN:   SBO - resolved Likely Secondary to bowel adhesions supportive care with pain medication and as needed Zofran IV fluid d/c since tolerating CLD PPI IV for GI prophylaxis General surgery following  Pending toleration of solid po and increase colostomy output    Question of perforation of bowels CT showed some signs of portal and gas and suspected gastric wall pneumatosis estimated risk of bowel perforation is high in this patient.   Can d/c abx coverage given improvement  Blood culture NGx2d   CKD stage  IIIa Euvolemic, creatinine level about his baseline Monitor BMP   HTN Chronic HFpEF Restarted po home meds as needed hydralazine   Seizure disorder Switch Keppra IV back to home po meds    CAD Stroke po ASA and statin  See hospital course re: question new weakness, pt's primary complaint is pain w/ difficulty moving arm d/t pain, CT head obtained and non-acute    COPD Continue Spiriva and as needed albuterol   Anxiety/depression P.o. medication resumed SSRI .    obese based on BMI: Body mass index is 30.13 kg/m.  Underweight - under 18.5  normal weight - 18.5 to 24.9 overweight - 25 to 29.9 obese - 30 or more   DVT prophylaxis: lovenox  IV fluids: ND 100 mL/h continuous IV fluids  Nutrition: NPO Central lines / invasive devices: NG tube placed 10/18, chronic colostomy   Code Status: FULL CODE ACP documentation reviewed:  MOST on file in [facility] - full code, full scope  TOC needs: TBD Barriers to dispo / significant pending items: resolution SBO             Subjective / Brief ROS:  Patient reports no concerns at this time Denies CP/SOB.  Pain controlled.  Denies new weakness.  Has not had clears yet as of AM.  Reports no concerns w/ urination/defecation.   Family Communication: none at this time will call family this afternoon     Objective Findings:  Vitals:   06/24/23 0753 06/24/23 1306 06/24/23 1314 06/24/23 1540  BP: (!) 146/84 (!) 175/96 (!) 172/85 135/71  Pulse: 70 72 69 83  Resp: 18 18  17  Temp: 98.2 F (36.8 C) 98 F (36.7 C)  97.6 F (36.4 C)  TempSrc:  Oral    SpO2: 98% 99% 97% 97%  Weight:      Height:        Intake/Output Summary (Last 24 hours) at 06/24/2023 1544 Last data filed at 06/24/2023 1114 Gross per 24 hour  Intake --  Output 1420 ml  Net -1420 ml   Filed Weights   06/22/23 1015 06/23/23 1404  Weight: 98 kg 98 kg    Examination:  Physical Exam Constitutional:      General: He is not in acute  distress. Eyes:     General: No visual field deficit.    Extraocular Movements: Extraocular movements intact.     Pupils: Pupils are equal, round, and reactive to light.  Cardiovascular:     Rate and Rhythm: Normal rate and regular rhythm.  Pulmonary:     Effort: Pulmonary effort is normal.     Breath sounds: Normal breath sounds.  Abdominal:     General: Bowel sounds are normal. There is no distension.     Palpations: Abdomen is soft.     Tenderness: There is abdominal tenderness.     Comments: Colostomy minimal liquid brown output   Skin:    General: Skin is warm and dry.  Neurological:     Mental Status: He is alert. Mental status is at baseline.     Cranial Nerves: No facial asymmetry.     Motor: Weakness (L arm and leg - baseline) present. No tremor or seizure activity.          Scheduled Medications:   aspirin EC  81 mg Oral Daily   baclofen  5 mg Oral BID   citalopram  10 mg Oral Daily   enoxaparin (LOVENOX) injection  0.5 mg/kg Subcutaneous Q24H   guaiFENesin  600 mg Oral BID   lamoTRIgine  50 mg Oral BID   loratadine  10 mg Oral Daily   nicotine  14 mg Transdermal Daily   pantoprazole (PROTONIX) IV  40 mg Intravenous Q24H   pregabalin  100 mg Oral QHS   pregabalin  50 mg Oral BID   tiotropium  18 mcg Inhalation Daily    Continuous Infusions:    PRN Medications:  acetaminophen **OR** acetaminophen, bisacodyl, calcium carbonate, hydrALAZINE, ondansetron (ZOFRAN) IV, traZODone  Antimicrobials from admission:  Anti-infectives (From admission, onward)    Start     Dose/Rate Route Frequency Ordered Stop   06/22/23 2000  metroNIDAZOLE (FLAGYL) IVPB 500 mg  Status:  Discontinued        500 mg 100 mL/hr over 60 Minutes Intravenous Every 12 hours 06/22/23 0940 06/23/23 0856   06/22/23 1200  cefTRIAXone (ROCEPHIN) 2 g in sodium chloride 0.9 % 100 mL IVPB  Status:  Discontinued        2 g 200 mL/hr over 30 Minutes Intravenous Every 24 hours 06/22/23 0940  06/23/23 0856   06/22/23 0615  ceFEPIme (MAXIPIME) 2 g in sodium chloride 0.9 % 100 mL IVPB        2 g 200 mL/hr over 30 Minutes Intravenous  Once 06/22/23 0607 06/22/23 0702   06/22/23 0615  metroNIDAZOLE (FLAGYL) IVPB 500 mg        500 mg 100 mL/hr over 60 Minutes Intravenous  Once 06/22/23 1191 06/22/23 4782           Data Reviewed:  I have personally reviewed the following...  CBC: Recent Labs  Lab 06/22/23 0409 06/23/23 0513  WBC 13.0* 10.1  HGB 14.7 13.1  HCT 47.0 41.7  MCV 92.3 92.5  PLT 245 186   Basic Metabolic Panel: Recent Labs  Lab 06/22/23 0409 06/23/23 0513  NA 140 145  K 3.8 3.7  CL 104 111  CO2 24 24  GLUCOSE 121* 106*  BUN 26* 22  CREATININE 1.42* 1.21  CALCIUM 10.0 8.5*   GFR: Estimated Creatinine Clearance: 63.9 mL/min (by C-G formula based on SCr of 1.21 mg/dL). Liver Function Tests: Recent Labs  Lab 06/22/23 0409  AST 16  ALT 14  ALKPHOS 69  BILITOT 0.5  PROT 9.3*  ALBUMIN 4.6   Recent Labs  Lab 06/22/23 0409  LIPASE 24   No results for input(s): "AMMONIA" in the last 168 hours. Coagulation Profile: No results for input(s): "INR", "PROTIME" in the last 168 hours. Cardiac Enzymes: No results for input(s): "CKTOTAL", "CKMB", "CKMBINDEX", "TROPONINI" in the last 168 hours. BNP (last 3 results) No results for input(s): "PROBNP" in the last 8760 hours. HbA1C: No results for input(s): "HGBA1C" in the last 72 hours. CBG: Recent Labs  Lab 06/24/23 1308  GLUCAP 94   Lipid Profile: No results for input(s): "CHOL", "HDL", "LDLCALC", "TRIG", "CHOLHDL", "LDLDIRECT" in the last 72 hours. Thyroid Function Tests: No results for input(s): "TSH", "T4TOTAL", "FREET4", "T3FREE", "THYROIDAB" in the last 72 hours. Anemia Panel: No results for input(s): "VITAMINB12", "FOLATE", "FERRITIN", "TIBC", "IRON", "RETICCTPCT" in the last 72 hours. Most Recent Urinalysis On File:     Component Value Date/Time   COLORURINE YELLOW 05/28/2022 1511    APPEARANCEUR CLEAR 05/28/2022 1511   APPEARANCEUR Clear 08/14/2014 0933   LABSPEC 1.015 05/28/2022 1511   LABSPEC 1.015 08/14/2014 0933   PHURINE 5.5 05/28/2022 1511   GLUCOSEU NEGATIVE 05/28/2022 1511   GLUCOSEU Negative 08/14/2014 0933   HGBUR TRACE (A) 05/28/2022 1511   BILIRUBINUR NEGATIVE 05/28/2022 1511   BILIRUBINUR Negative 08/14/2014 0933   KETONESUR NEGATIVE 05/28/2022 1511   PROTEINUR 100 (A) 05/28/2022 1511   UROBILINOGEN 0.2 06/12/2010 0103   NITRITE NEGATIVE 05/28/2022 1511   LEUKOCYTESUR TRACE (A) 05/28/2022 1511   LEUKOCYTESUR Negative 08/14/2014 0933   Sepsis Labs: @LABRCNTIP (procalcitonin:4,lacticidven:4) Microbiology: Recent Results (from the past 240 hour(s))  Blood culture (routine x 2)     Status: None (Preliminary result)   Collection Time: 06/22/23  8:46 AM   Specimen: BLOOD  Result Value Ref Range Status   Specimen Description BLOOD BLOOD RIGHT ARM  Final   Special Requests   Final    BOTTLES DRAWN AEROBIC AND ANAEROBIC Blood Culture adequate volume   Culture   Final    NO GROWTH 2 DAYS Performed at [facility], [address], [place], [place] [address]    Report Status PENDING  Incomplete  Blood culture (routine x 2)     Status: None (Preliminary result)   Collection Time: 06/22/23  8:46 AM   Specimen: BLOOD  Result Value Ref Range Status   Specimen Description BLOOD BLOOD LEFT ARM  Final   Special Requests   Final    BOTTLES DRAWN AEROBIC ONLY Blood Culture adequate volume   Culture   Final    NO GROWTH 2 DAYS Performed at [facility], [address], [place], [place] [address]    Report Status PENDING  Incomplete  Gastrointestinal Panel by PCR , Stool     Status: None   Collection Time: 06/24/23 12:30 AM   Specimen: Stool  Result Value Ref Range  Status   Campylobacter species NOT DETECTED NOT DETECTED Final   Plesimonas shigelloides NOT DETECTED NOT DETECTED Final   Salmonella species NOT DETECTED NOT DETECTED  Final   Yersinia enterocolitica NOT DETECTED NOT DETECTED Final   Vibrio species NOT DETECTED NOT DETECTED Final   Vibrio cholerae NOT DETECTED NOT DETECTED Final   Enteroaggregative E coli (EAEC) NOT DETECTED NOT DETECTED Final   Enteropathogenic E coli (EPEC) NOT DETECTED NOT DETECTED Final   Enterotoxigenic E coli (ETEC) NOT DETECTED NOT DETECTED Final   Shiga like toxin producing E coli (STEC) NOT DETECTED NOT DETECTED Final   Shigella/Enteroinvasive E coli (EIEC) NOT DETECTED NOT DETECTED Final   Cryptosporidium NOT DETECTED NOT DETECTED Final   Cyclospora cayetanensis NOT DETECTED NOT DETECTED Final   Entamoeba histolytica NOT DETECTED NOT DETECTED Final   Giardia lamblia NOT DETECTED NOT DETECTED Final   Adenovirus F40/41 NOT DETECTED NOT DETECTED Final   Astrovirus NOT DETECTED NOT DETECTED Final   Norovirus GI/GII NOT DETECTED NOT DETECTED Final   Rotavirus A NOT DETECTED NOT DETECTED Final   Sapovirus (I, II, IV, and V) NOT DETECTED NOT DETECTED Final    Comment: Performed at [facility], [address] [place], [place] [address]      Radiology Studies last 3 days: CT HEAD WO CONTRAST ( )  Result Date: 06/24/2023 CLINICAL DATA:  Neuro deficit, acute, stroke suspected. EXAM: CT HEAD WITHOUT CONTRAST TECHNIQUE: Contiguous axial images were obtained from the base of the skull through the vertex without intravenous contrast. RADIATION DOSE REDUCTION: This exam was performed according to the departmental dose-optimization program which includes automated exposure control, adjustment of the mA and/or kV according to patient size and/or use of iterative reconstruction technique. COMPARISON:  CT head without contrast 06/03/2023 FINDINGS: Brain: A remote anterior right frontal lobe infarct is stable. Chronic atrophy and white matter disease is unchanged. Remote infarct of the right occipital lobe is stable. Ex vacuo dilation of the right lateral ventricle is noted both  anteriorly and posteriorly. No acute infarct, hemorrhage, or mass lesion is present. Cyst Remote left posterior cerebellar infarcts are present. The brainstem and cerebellum are otherwise normal. Midline structures are within normal limits. Vascular: Atherosclerotic calcifications are again noted within the cavernous internal carotid arteries bilaterally. No hyperdense vessel is present. Skull: Calvarium is intact. Calvarium is intact. No focal lytic or blastic lesions are present. No significant extracranial soft tissue lesion is present. Sinuses/Orbits: A polyp or mucous retention cyst is present in the right sphenoid sinus. A remote right orbital blowout fracture is again noted. The paranasal sinuses and mastoid air cells are otherwise clear. Globes and orbits are otherwise within normal limits bilaterally. IMPRESSION: 1. No acute intracranial abnormality or significant interval change. 2. Stable remote anterior right frontal lobe infarct. 3. Stable remote right occipital lobe infarct. 4. Remote left posterior cerebellar infarcts. Electronically Signed   By: Marin Roberts M.D.   On: 06/24/2023 15:12   DG Abd Portable 1V-Small Bowel Obstruction Protocol-initial, 8 hr delay  Result Date: 06/22/2023 CLINICAL DATA:  Small bowel obstruction, 8 hour delay EXAM: PORTABLE ABDOMEN - 1 VIEW COMPARISON:  Radiographs and CT earlier today. FINDINGS: Enteric contrast is seen within the ascending colon. Contrast within the left colon was present on prior CT. Persistent but diminishing gaseous small bowel distension centrally. Tip of the enteric tube may be within the proximal duodenum. IMPRESSION: Enteric contrast in the ascending colon. Persistent bed improving gaseous small bowel distension. Findings are suggestive of partial  or resolving small bowel obstruction. Electronically Signed   By: Narda Rutherford M.D.   On: 06/22/2023 21:03   DG Abd Portable 1 View  Result Date: 06/22/2023 CLINICAL DATA:   Advancement of nasogastric tube. EXAM: PORTABLE ABDOMEN - 1 VIEW COMPARISON:  Earlier today FINDINGS: Tip and side port of the enteric tube below the diaphragm in the stomach. Gaseous small bowel distension again seen. Dense barium in the left colon. IMPRESSION: Tip and side port of the enteric tube below the diaphragm in the stomach. Electronically Signed   By: Narda Rutherford M.D.   On: 06/22/2023 09:18   DG Abd Portable 1V-Small Bowel Protocol-Position Verification  Result Date: 06/22/2023 CLINICAL DATA:  Nasogastric tube placement. EXAM: PORTABLE ABDOMEN - 1 VIEW COMPARISON:  CT earlier today FINDINGS: Tip and side port of the enteric tube below the diaphragm in the stomach. Small bowel dilatation in the upper abdomen. Dense barium in the left colon. IMPRESSION: Tip and side port of the enteric tube below the diaphragm in the stomach. Electronically Signed   By: Narda Rutherford M.D.   On: 06/22/2023 09:17   CT ABDOMEN PELVIS W CONTRAST  Result Date: 06/22/2023 CLINICAL DATA:  Bowel obstruction suspected.  Colostomy patient. EXAM: CT ABDOMEN AND PELVIS WITH CONTRAST TECHNIQUE: Multidetector CT imaging of the abdomen and pelvis was performed using the standard protocol following bolus administration of intravenous contrast. RADIATION DOSE REDUCTION: This exam was performed according to the departmental dose-optimization program which includes automated exposure control, adjustment of the mA and/or kV according to patient size and/or use of iterative reconstruction technique. CONTRAST:  OMNIPAQUE IOHEXOL 300 MG/ML  SOLN COMPARISON:  CTs with IV contrast 06/12/2022 and 05/31/2022 FINDINGS: Lower chest: Lung bases again show scattered linear scarring or atelectasis. Chronic moderate elevation right hemidiaphragm. No focal infiltrate. There is mild cardiomegaly. Scattered calcific plaque LAD coronary artery. Small hiatal hernia. Hepatobiliary: Occasional calcified granulomas in the liver. No mass  enhancement. Portal venous gas is present in the parenchyma predominantly in the left-greater-than-right lower lobes. There are tiny stones layering in the proximal gallbladder, without wall thickening or biliary dilatation. Pancreas: No abnormality. Spleen: No abnormality. Adrenals/Urinary Tract: Stable bilateral adrenal nodules, 2.8 cm on the left and 2 cm on MRI. Stable in size back to a CTA abdomen and pelvis from 09/30/2017. No follow-up imaging is recommended. Patchy cortical scarring in both kidneys is again noted without mass enhancement, urinary stones or obstruction. The bladder is unremarkable for the degree of distention. Stomach/Bowel: Suspect gastric wall pneumatosis posteriorly. The stomach is dilated with fluid, small-bowel obstruction continuing into the jejunum are multiple upper to mid abdominal segments. The transitional segment is probably in the distal 1/3 of the ileum. There is a sharply angulated transitional segment in the left mid abdomen series 2 axial images 42-46 and on coronal reconstruction series 5 images 36-40 with likely etiology of adhesive disease, and collapse of the downstream small bowel segments. There is no appreciable small or large bowel pneumatosis. There is fluid in the ascending and transverse colon and new demonstration of a loop colostomy of the distal transverse colon through the left mid abdominal wall. 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 No overt thickening of the colon is seen. There is dense stool and barium in the descending colon, rectosigmoid surgical anastomosis. Vascular/Lymphatic: Aortic atherosclerosis. No enlarged abdominal or pelvic lymph nodes. Reproductive: No prostatomegaly. Other: No free fluid, free hemorrhage, free air or focal inflammatory process. There is no incarcerated hernia. Subcutaneous air in  the anterior left lower abdomen is probably due to a recent injection. Musculoskeletal: There is streak artifact from again noted left hip arthroplasty.  Osteopenia and degenerative changes of the spine. Sclerotic lesion left L4 pedicle and transverse process is chronically seen. IMPRESSION: 1. High-grade small-bowel obstruction with transition probably in the distal 1/3 of the ileum, with a sharply angulated transitional segment in the left mid abdomen, likely due to adhesive disease. 2. Portal venous gas in the liver, with suspected gastric wall pneumatosis posteriorly. This can be seen with GI tract ischemia. No pneumatosis is seen in the bowel. 3. Fluid in the ascending and transverse colon but no overt thickening of the colon. 4. 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 5. Aortic and coronary artery atherosclerosis. 6. Cholelithiasis. 7. Stable bilateral adrenal nodules. 8. Stable sclerotic lesion left L4 pedicle and transverse process. 9. Subcutaneous air in the anterior left lower abdomen, probably due to a recent injection. 10. Critical Value/emergent results were called by telephone at the time of interpretation on 06/22/2023 at 6:08 am to provider MARK QUALE , who verbally acknowledged these results. Aortic Atherosclerosis (ICD10-I70.0). Electronically Signed   By: Almira Bar M.D.   On: 06/22/2023 06:08   DG Chest Port 1 View  Result Date: 06/22/2023 CLINICAL DATA:  Abdominal pain EXAM: PORTABLE CHEST 1 VIEW COMPARISON:  07/01/2022 FINDINGS: Normal heart size and mediastinal contours. Chronic elevation of the right diaphragm. No acute infiltrate or edema. No effusion or pneumothorax. No acute osseous findings. IMPRESSION: No active disease. Electronically Signed   By: Tiburcio Pea M.D.   On: 06/22/2023 05:04    Time spent 35 min Critical care time spent rapid response, 35 min    Sunnie Nielsen, DO [facility] 06/24/2023, 3:44 PM    Dictation software may have been used to generate the above note. Typos may occur and escape review in typed/dictated notes. Please contact Dr Lyn Hollingshead  directly for clarity if needed.  Staff may message me via secure chat in Epic  but this may not receive an immediate response,  please page me for urgent matters!  If 7PM-7AM, please contact night coverage [email or web address]

## 2023-06-24 NOTE — Progress Notes (Signed)
PT Cancellation Note  Patient Details Name: Calvin [name] MRN: [id number] DOB: 05/10/1951   Cancelled Treatment:    Reason Eval/Treat Not Completed: Patient at procedure or test/unavailable (Arrived to room to find 3RN at bedside doing assessment. RR recently called. Pt to go OTF for imaging. WIll defer PT eval, attempt again next day.)   2:08 PM, 06/24/23 Calvin [name], PT, DPT Physical Therapist - [facility]  [phone] (ASCOM)    Calvin [name] 06/24/2023, 2:08 PM

## 2023-06-24 NOTE — Progress Notes (Signed)
OT Cancellation Note  Patient Details Name: Calvin [name] MRN: [id number] DOB: [birth year]   Cancelled Treatment:    Reason Eval/Treat Not Completed: Other (comment) (per chart rapid response called with reported R sided weakness, OT will hold at this time and reattempt when pt is medically ready)  Oleta Mouse, OTD OTR/L  06/24/23, 1:28 PM

## 2023-06-24 NOTE — Plan of Care (Signed)

## 2023-06-24 NOTE — Progress Notes (Signed)
SLP Cancellation Note  Patient Details Name: Calvin [name] MRN: [id number] DOB: 12-03-1950   Cancelled treatment:       Reason Eval/Treat Not Completed: SLP screened, no needs identified, will sign off (SLP consult received and appreciated for cognitive-linguistic evaluation. Per chart review, pt with documented hx of cognitive impairment following stroke. Pt resides in SNF. Pt A&Ox2 which is baseline for per from chart review (dating back to 2023). Recommend f/u SLP evaluation in SNF to allow for a more dynamic and functional assessment of pt's cognitive-linguistic functioning.)  Clyde Canterbury, M.S., CCC-SLP Speech-Language Pathologist [facility] [phone] Arnette Felts)  Woodroe Chen 06/24/2023, 11:14 AM

## 2023-06-24 NOTE — Progress Notes (Signed)
Responded to rapid response page. On arrival, pt awake, maintaining airway. Was told patient was having some right sided weakness and stated he felt like he was having a stroke. Pt RN, NT, ICU rapid response RN present.

## 2023-06-24 NOTE — Progress Notes (Signed)
   06/24/23 1300  Spiritual Encounters  Type of Visit Initial  Referral source Code page  Reason for visit Code  OnCall Visit Yes   Chaplain responded to Rapid response code. No family present.

## 2023-06-24 NOTE — Progress Notes (Signed)
Notified by another floor RN patient verbalized that he was experiencing "a stroke" I went bedside and obtained vital signs including BGL check and notifying MD and calling a rapid response, patient was given a neuro exam to compare to baseline and LKN was 1300 and patient during neuro exam was exhibiting the slightly more prominent left sided facial droop. MD and supporting team members came bedside and placed proper interventions in place. Patient was taken to STAT head CT by myself and NA and transported back post head CT.

## 2023-06-24 NOTE — Significant Event (Signed)
Rapid Response Event Note   Reason for Call : called RRT for pt c/o pain and left side weakness...   Initial Focused Assessment: laying in bed, DR Lyn Hollingshead to bedside and preformed neuro assessment, pt not able to differentiate pain and weakness, see flowsheets for VS.       Interventions: stat head CT   Plan of Care: Stat head CT, RN Sanah to call for further assistance.    Event Summary: as above  MD Notified: Alexander 1315 Call Time:1313 Arrival Time:1315 End Time:1325  Alfonso Ramus, RN

## 2023-06-24 NOTE — Plan of Care (Signed)
  Problem: Education: Goal: Knowledge of General Education information will improve Description: Including pain rating scale, medication(s)/side effects and non-pharmacologic comfort measures Outcome: Progressing   Problem: Clinical Measurements: Goal: Ability to maintain clinical measurements within normal limits will improve Outcome: Progressing Goal: Will remain free from infection Outcome: Progressing Goal: Diagnostic test results will improve Outcome: Progressing Goal: Respiratory complications will improve Outcome: Progressing Goal: Cardiovascular complication will be avoided Outcome: Progressing   Problem: Coping: Goal: Level of anxiety will decrease Outcome: Progressing   Problem: Elimination: Goal: Will not experience complications related to bowel motility Outcome: Progressing Goal: Will not experience complications related to urinary retention Outcome: Progressing

## 2023-06-25 DIAGNOSIS — K56609 Unspecified intestinal obstruction, unspecified as to partial versus complete obstruction: Secondary | ICD-10-CM | POA: Diagnosis not present

## 2023-06-25 MED ORDER — ACETAMINOPHEN 650 MG RE SUPP: 650.0000 mg | Freq: Four times a day (QID) | RECTAL | Status: DC | PRN

## 2023-06-25 MED ORDER — FUROSEMIDE 20 MG PO TABS
20.0000 mg | ORAL_TABLET | Freq: Every day | ORAL | Status: DC
  Administered 2023-06-25 – 2023-06-26 (×2): 20 mg via ORAL
  Filled 2023-06-25 (×2): qty 1

## 2023-06-25 MED ORDER — PANTOPRAZOLE SODIUM 40 MG PO TBEC
40.0000 mg | DELAYED_RELEASE_TABLET | Freq: Every day | ORAL | Status: DC
Start: 2023-06-26 — End: 2023-06-27
  Administered 2023-06-26: 40 mg via ORAL
  Filled 2023-06-25: qty 1

## 2023-06-25 MED ORDER — SIMVASTATIN 20 MG PO TABS
20.0000 mg | ORAL_TABLET | Freq: Every day | ORAL | Status: DC
  Administered 2023-06-25: 20 mg via ORAL
  Filled 2023-06-25: qty 1

## 2023-06-25 MED ORDER — ACETAMINOPHEN 325 MG PO TABS: 650.0000 mg | ORAL_TABLET | Freq: Four times a day (QID) | ORAL | Status: DC | PRN

## 2023-06-25 MED ORDER — OXYCODONE HCL 5 MG PO TABS
5.0000 mg | ORAL_TABLET | Freq: Four times a day (QID) | ORAL | Status: DC | PRN
  Administered 2023-06-25: 5 mg via ORAL
  Filled 2023-06-25: qty 1

## 2023-06-25 MED ORDER — LEVETIRACETAM 750 MG PO TABS
750.0000 mg | ORAL_TABLET | Freq: Two times a day (BID) | ORAL | Status: DC
  Administered 2023-06-25 – 2023-06-26 (×3): 750 mg via ORAL
  Filled 2023-06-25 (×4): qty 1

## 2023-06-25 MED ORDER — TAMSULOSIN HCL 0.4 MG PO CAPS
0.4000 mg | ORAL_CAPSULE | Freq: Every evening | ORAL | Status: DC
  Administered 2023-06-25 – 2023-06-26 (×2): 0.4 mg via ORAL
  Filled 2023-06-25 (×2): qty 1

## 2023-06-25 MED ORDER — LACTULOSE 10 GM/15ML PO SOLN
20.0000 g | Freq: Every day | ORAL | Status: DC
  Administered 2023-06-25 – 2023-06-26 (×2): 20 g via ORAL
  Filled 2023-06-25 (×2): qty 30

## 2023-06-25 NOTE — Evaluation (Signed)
Physical Therapy Evaluation Patient Details Name: Calvin [name] MRN: [id number] DOB: [birth year] Today's Date: 06/25/2023  History of Present Illness  Pt is a 72 y/o male admitted 06/22/23 for small bowel obstruction. PmHx includes: stroke with left sided weakness, HTN, COPD, and CAD/MI.   Clinical Impression  Pt received in bed and agreed to PT session. Pt performed bed mobility MinA for trunk support with the use of bed rails. HHA was necessary for pt to scoot fwd while at EOB. While at EOB, pt demonstrated generalized weakness and pain with LUE and LLE gross strength screening. Pt actively able to perform hip flexion and knee ext, however is unable to hold position for as long and with decreased ROM in comparison to the right side. Pt performed STS with the use of RW (2wheels) MinA where pt reported feeling dizzy. Verbal cues were necessary in order to perform step pivot transfer from standing EOB to recliner MinA where session was completed. OT session began immediately following PT session. Pt tolerated Tx well today and will continue to benefit from skilled PT sessions to improve strength, activity tolerance, gait, and functional mobility following d/c.      If plan is discharge home, recommend the following: A lot of help with walking and/or transfers;[name];Assist for transportation;[name]   Can travel by private vehicle   No    Equipment Recommendations Rolling walker (2 wheels)  Recommendations for Other Services       Functional Status Assessment Patient has had a recent decline in their functional status and demonstrates the ability to make significant improvements in function in a reasonable and predictable amount of time.     Precautions / Restrictions Precautions Precautions: Fall Restrictions Weight Bearing Restrictions: No      Mobility  Bed Mobility Overal bed mobility: Needs Assistance Bed  Mobility: Supine to Sit     Supine to sit: Min assist     General bed mobility comments: Pt performed bed mobility MinA for trunk management. HHA was necessary for Pt to scoot to EOB.    Transfers Overall transfer level: Needs assistance Equipment used: Rolling walker (2 wheels) Transfers: Sit to/from Stand, Bed to chair/wheelchair/BSC Sit to Stand: Min assist   Step pivot transfers: Contact guard assist       General transfer comment: Pt performed STS MinA to perform STS with the use of RW (2wheels). Verbal cues necessary to assist in performing STS. Pt step pivot from EOB to recliner CGA with the use of RW.    Ambulation/Gait Ambulation/Gait assistance: Contact guard assist Gait Distance (Feet): 3 Feet Assistive device: Rolling walker (2 wheels) Gait Pattern/deviations: Step-to pattern Gait velocity: decreased     General Gait Details: Pt transfered from EOB to recliner with the use of RW (2wheels).  Stairs            Wheelchair Mobility     Tilt Bed    Modified Rankin (Stroke Patients Only)       Balance Overall balance assessment: Needs assistance Sitting-balance support: Bilateral upper extremity supported, Feet supported Sitting balance-Leahy Scale: Fair   Postural control: Posterior lean Standing balance support: Bilateral upper extremity supported Standing balance-Leahy Scale: Fair                               Pertinent Vitals/Pain Pain Assessment Pain Location: Left sided discomfort and weakness. LUE and LLE. Pain Descriptors /  Indicators: Constant Pain Intervention(s): Monitored during session    Home Living Family/patient expects to be discharged to:: Skilled nursing facility                   Additional Comments: While at facility, pt reports that they have been receiving PT sessions for their left sided weakness.    Prior Function Prior Level of Function : Needs assist  Cognitive Assist : Mobility  (cognitive) Mobility (Cognitive): Intermittent cues ADLs (Cognitive): Intermittent cues Physical Assist : Mobility (physical) Mobility (physical): Bed mobility;Transfers;Gait   Mobility Comments: uses W/C most of the time per report and reports being able to SPT from bed<>W/C ADLs Comments: says he has assistance with a shower 2x/wk and otherwise they bathe him in bed     Extremity/Trunk Assessment   Upper Extremity Assessment Upper Extremity Assessment: Right hand dominant;Generalized weakness;LUE deficits/detail LUE Deficits / Details: pt reports ongoing LUE weakness specifically with his L hand grip strength 3 to 3+/5. Reports he has putty and a ball at rehab facility he was using; ROM WFL LUE: Shoulder pain with ROM LUE Coordination: decreased fine motor    Lower Extremity Assessment Lower Extremity Assessment: Generalized weakness LLE Deficits / Details: Generalized weakness post stroke.       Communication   Communication Communication: No apparent difficulties Cueing Techniques: Verbal cues  Cognition Arousal: Alert Behavior During Therapy: WFL for tasks assessed/performed Overall Cognitive Status: Within Functional Limits for tasks assessed                                 General Comments: Pt pleasant and willing to participate in PT session.        General Comments General comments (skin integrity, edema, etc.): purewick, ostomy    Exercises     Assessment/Plan    PT Assessment Patient needs continued PT services  PT Problem List Decreased strength;Decreased activity tolerance;Decreased mobility;Pain       PT Treatment Interventions DME instruction;Therapeutic activities;Functional mobility training    PT Goals (Current goals can be found in the Care Plan section)  Acute Rehab PT Goals Patient Stated Goal: To go home, get stronger PT Goal Formulation: With patient Time For Goal Achievement: 07/09/23 Potential to Achieve Goals: Good     Frequency Min 1X/week     Co-evaluation               AM-PAC PT "6 Clicks" Mobility  Outcome Measure Help needed turning from your back to your side while in a flat bed without using bedrails?: A Little Help needed moving from lying on your back to sitting on the side of a flat bed without using bedrails?: A Little Help needed moving to and from a bed to a chair (including a wheelchair)?: A Little Help needed standing up from a chair using your arms (e.g., wheelchair or bedside chair)?: A Lot Help needed to walk in hospital room?: A Lot Help needed climbing 3-5 steps with a railing? : A Lot 6 Click Score: 15    End of Session Equipment Utilized During Treatment: Gait belt Activity Tolerance: Patient tolerated treatment well Patient left: in chair;with call bell/phone within reach;Other (comment) (OT began immediately followiong the end of PT session.) Nurse Communication: Mobility status PT Visit Diagnosis: Unsteadiness on feet (R26.81);Other abnormalities of gait and mobility (R26.89);Muscle weakness (generalized) (M62.81);Difficulty in walking, not elsewhere classified (R26.2);Pain Pain - Right/Left: Left Pain - part of body: Leg;Arm;Hand;Hip;Knee;Ankle  and joints of foot    Time: 8295-6213 PT Time Calculation (min) (ACUTE ONLY): 21 min   Charges:   PT Evaluation $PT Eval Low Complexity: 1 Low PT Treatments $Therapeutic Activity: 8-22 mins PT General Charges $$ ACUTE PT VISIT: 1 Visit         Bradon Fester Sauv[place] Howard SPT, LAT, ATC   Nini Cavan Sauvignon-Howard 06/25/2023, 2:06 PM

## 2023-06-25 NOTE — Progress Notes (Signed)
PROGRESS NOTE    Calvin [name]   DGU:[id number] DOB: 1951-06-18  DOA: 06/22/2023 Date of Service: 06/25/23 which is hospital day 3  PCP: Lucita Ferrara, MD    HPI: Calvin [name] is a 72 y.o. 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   Hospital course / significant events:  10/18: to ED,  CT abdomen pelvis showed high-grade SBO with transition set at the left mid abdomen likely adhesion related, although there are signs of portal vein gas and gastric or pneumatosis. General surgeon recs - no ugent surgical intervention, trial NG tube and conservative management. On Abx.  10/19: NG out this morning, advanced to CLD 10/20: advance to FLD. This afternoon rapid response called d/t concern for L arm pain vs weakness, unclear if truly new weakness (already has partial L hemiparesis) suspect pain, pt is poor historian and seems confused by questions to elucidate whether trouble moving d/t pain or weakness, CT head non-acute, continue neuro checks 10/21: arm back at baseline, nonpainful. Still low colostomy output, remain on FLD.   Consultants:  General surgery   Procedures/Surgeries: none      ASSESSMENT & PLAN:   SBO - improved Likely Secondary to bowel adhesions supportive care with pain medication and as needed Zofran Full liquid diet until ostomy output is improving  PPI IV for GI prophylaxis General surgery following    Question of perforation of bowels CT showed some signs of portal and gas and suspected gastric wall pneumatosis estimated risk of bowel perforation is high in this patient.   Can d/c abx coverage given improvement   Blood culture NG   CKD stage IIIa Euvolemic, creatinine level about his baseline Monitor BMP   HTN Chronic HFpEF Restarted po home meds as needed hydralazine   Seizure disorder Switch Keppra IV back to home po meds    CAD Stroke po ASA and statin  See hospital course re: question new weakness, pt's primary complaint is pain w/ difficulty moving arm d/t pain, CT head obtained and non-acute and following day complaints/symptoms resolved    COPD Continue Spiriva and as needed albuterol   Anxiety/depression P.o. medication resumed SSRI .    obese based on BMI: Body mass index is 30.13 kg/m.  Underweight - under 18.5  normal weight - 18.5 to 24.9 overweight - 25 to 29.9 obese - 30 or more   DVT prophylaxis: lovenox  IV fluids: ND 100 mL/h continuous IV fluids  Nutrition: NPO Central lines / invasive devices: NG tube placed 10/18, chronic colostomy   Code Status: FULL CODE ACP documentation reviewed:  MOST on file in [facility] - full code, full scope  TOC needs: TBD Barriers to dispo / significant pending items: resolution SBO             Subjective / Brief ROS:  Patient reports no concerns at this time - arm pain is much better and he's able to lift hi sL arm about as well as he usually can at baseline  Reports abdominal pain is better today Denies CP/SOB.  Pain controlled.  Denies new weakness.    Family Communication: none at this time will call family this afternoon     Objective Findings:  Vitals:  06/24/23 1540 06/24/23 1918 06/25/23 0340 06/25/23 0904  BP: 135/71 (!) 155/82 128/70 (!) 163/92  Pulse: 83 73 61 66  Resp: 17 18 16 18   Temp: 97.6 F (36.4 C) 98.3 F (36.8 C) 98.2 F (36.8 C) 98.1 F (36.7 C)  TempSrc:  Oral Oral Oral  SpO2: 97% 99% 93% 98%  Weight:      Height:        Intake/Output Summary (Last 24 hours) at 06/25/2023 1320 Last data filed at 06/25/2023 1610 Gross per 24 hour  Intake 240 ml  Output 2450 ml  Net  -2210 ml   Filed Weights   06/22/23 1015 06/23/23 1404  Weight: 98 kg 98 kg    Examination:  Physical Exam Constitutional:      General: He is not in acute distress. Eyes:     General: No visual field deficit.    Extraocular Movements: Extraocular movements intact.     Pupils: Pupils are equal, round, and reactive to light.  Cardiovascular:     Rate and Rhythm: Normal rate and regular rhythm.  Pulmonary:     Effort: Pulmonary effort is normal.     Breath sounds: Normal breath sounds.  Abdominal:     General: Bowel sounds are normal. There is no distension.     Palpations: Abdomen is soft.     Tenderness: There is abdominal tenderness.     Comments: Colostomy minimal liquid brown output   Skin:    General: Skin is warm and dry.  Neurological:     Mental Status: He is alert. Mental status is at baseline.     Cranial Nerves: No facial asymmetry.     Motor: Weakness (L arm and leg - baseline) present. No tremor or seizure activity.          Scheduled Medications:   aspirin EC  81 mg Oral Daily   baclofen  5 mg Oral BID   citalopram  10 mg Oral Daily   enoxaparin (LOVENOX) injection  0.5 mg/kg Subcutaneous Q24H   furosemide  20 mg Oral Daily   guaiFENesin  600 mg Oral BID   lactulose  20 g Oral Daily   lamoTRIgine  50 mg Oral BID   levETIRAcetam  750 mg Oral BID   loratadine  10 mg Oral Daily   nicotine  14 mg Transdermal Daily   [START ON 06/26/2023] pantoprazole  40 mg Oral Daily   pregabalin  100 mg Oral QHS   pregabalin  50 mg Oral BID   simvastatin  20 mg Oral QHS   tamsulosin  0.4 mg Oral QPM   tiotropium  18 mcg Inhalation Daily    Continuous Infusions:    PRN Medications:  acetaminophen **OR** acetaminophen, bisacodyl, calcium carbonate, guaiFENesin-dextromethorphan, hydrALAZINE, ondansetron (ZOFRAN) IV, oxyCODONE, traZODone  Antimicrobials from admission:  Anti-infectives (From admission, onward)    Start     Dose/Rate Route Frequency Ordered  Stop   06/22/23 2000  metroNIDAZOLE (FLAGYL) IVPB 500 mg  Status:  Discontinued        500 mg 100 mL/hr over 60 Minutes Intravenous Every 12 hours 06/22/23 0940 06/23/23 0856   06/22/23 1200  cefTRIAXone (ROCEPHIN) 2 g in sodium chloride 0.9 % 100 mL IVPB  Status:  Discontinued        2 g 200 mL/hr over 30 Minutes Intravenous Every 24 hours 06/22/23 0940 06/23/23 0856   06/22/23 0615  ceFEPIme (MAXIPIME) 2 g in sodium chloride 0.9 % 100 mL IVPB  2 g 200 mL/hr over 30 Minutes Intravenous  Once 06/22/23 0607 06/22/23 0702   06/22/23 0615  metroNIDAZOLE (FLAGYL) IVPB 500 mg        500 mg 100 mL/hr over 60 Minutes Intravenous  Once 06/22/23 9562 06/22/23 1308           Data Reviewed:  I have personally reviewed the following...  CBC: Recent Labs  Lab 06/22/23 0409 06/23/23 0513  WBC 13.0* 10.1  HGB 14.7 13.1  HCT 47.0 41.7  MCV 92.3 92.5  PLT 245 186   Basic Metabolic Panel: Recent Labs  Lab 06/22/23 0409 06/23/23 0513  NA 140 145  K 3.8 3.7  CL 104 111  CO2 24 24  GLUCOSE 121* 106*  BUN 26* 22  CREATININE 1.42* 1.21  CALCIUM 10.0 8.5*   GFR: Estimated Creatinine Clearance: 63.9 mL/min (by C-G formula based on SCr of 1.21 mg/dL). Liver Function Tests: Recent Labs  Lab 06/22/23 0409  AST 16  ALT 14  ALKPHOS 69  BILITOT 0.5  PROT 9.3*  ALBUMIN 4.6   Recent Labs  Lab 06/22/23 0409  LIPASE 24   No results for input(s): "AMMONIA" in the last 168 hours. Coagulation Profile: No results for input(s): "INR", "PROTIME" in the last 168 hours. Cardiac Enzymes: No results for input(s): "CKTOTAL", "CKMB", "CKMBINDEX", "TROPONINI" in the last 168 hours. BNP (last 3 results) No results for input(s): "PROBNP" in the last 8760 hours. HbA1C: No results for input(s): "HGBA1C" in the last 72 hours. CBG: Recent Labs  Lab 06/24/23 1308  GLUCAP 94   Lipid Profile: No results for input(s): "CHOL", "HDL", "LDLCALC", "TRIG", "CHOLHDL", "LDLDIRECT" in the last  72 hours. Thyroid Function Tests: No results for input(s): "TSH", "T4TOTAL", "FREET4", "T3FREE", "THYROIDAB" in the last 72 hours. Anemia Panel: No results for input(s): "VITAMINB12", "FOLATE", "FERRITIN", "TIBC", "IRON", "RETICCTPCT" in the last 72 hours. Most Recent Urinalysis On File:     Component Value Date/Time   COLORURINE YELLOW 05/28/2022 1511   APPEARANCEUR CLEAR 05/28/2022 1511   APPEARANCEUR Clear 08/14/2014 0933   LABSPEC 1.015 05/28/2022 1511   LABSPEC 1.015 08/14/2014 0933   PHURINE 5.5 05/28/2022 1511   GLUCOSEU NEGATIVE 05/28/2022 1511   GLUCOSEU Negative 08/14/2014 0933   HGBUR TRACE (A) 05/28/2022 1511   BILIRUBINUR NEGATIVE 05/28/2022 1511   BILIRUBINUR Negative 08/14/2014 0933   KETONESUR NEGATIVE 05/28/2022 1511   PROTEINUR 100 (A) 05/28/2022 1511   UROBILINOGEN 0.2 06/12/2010 0103   NITRITE NEGATIVE 05/28/2022 1511   LEUKOCYTESUR TRACE (A) 05/28/2022 1511   LEUKOCYTESUR Negative 08/14/2014 0933   Sepsis Labs: @LABRCNTIP (procalcitonin:4,lacticidven:4) Microbiology: Recent Results (from the past 240 hour(s))  Blood culture (routine x 2)     Status: None (Preliminary result)   Collection Time: 06/22/23  8:46 AM   Specimen: BLOOD  Result Value Ref Range Status   Specimen Description BLOOD BLOOD RIGHT ARM  Final   Special Requests   Final    BOTTLES DRAWN AEROBIC AND ANAEROBIC Blood Culture adequate volume   Culture   Final    NO GROWTH 3 DAYS Performed at [facility], [address] [place], [place] [address]    Report Status PENDING  Incomplete  Blood culture (routine x 2)     Status: None (Preliminary result)   Collection Time: 06/22/23  8:46 AM   Specimen: BLOOD  Result Value Ref Range Status   Specimen Description BLOOD BLOOD LEFT ARM  Final   Special Requests   Final    BOTTLES  DRAWN AEROBIC ONLY Blood Culture adequate volume   Culture   Final    NO GROWTH 3 DAYS Performed at [facility], [address]  [place], [place] [address]    Report Status PENDING  Incomplete  Gastrointestinal Panel by PCR , Stool     Status: None   Collection Time: 06/24/23 12:30 AM   Specimen: Stool  Result Value Ref Range Status   Campylobacter species NOT DETECTED NOT DETECTED Final   Plesimonas shigelloides NOT DETECTED NOT DETECTED Final   Salmonella species NOT DETECTED NOT DETECTED Final   Yersinia enterocolitica NOT DETECTED NOT DETECTED Final   Vibrio species NOT DETECTED NOT DETECTED Final   Vibrio cholerae NOT DETECTED NOT DETECTED Final   Enteroaggregative E coli (EAEC) NOT DETECTED NOT DETECTED Final   Enteropathogenic E coli (EPEC) NOT DETECTED NOT DETECTED Final   Enterotoxigenic E coli (ETEC) NOT DETECTED NOT DETECTED Final   Shiga like toxin producing E coli (STEC) NOT DETECTED NOT DETECTED Final   Shigella/Enteroinvasive E coli (EIEC) NOT DETECTED NOT DETECTED Final   Cryptosporidium NOT DETECTED NOT DETECTED Final   Cyclospora cayetanensis NOT DETECTED NOT DETECTED Final   Entamoeba histolytica NOT DETECTED NOT DETECTED Final   Giardia lamblia NOT DETECTED NOT DETECTED Final   Adenovirus F40/41 NOT DETECTED NOT DETECTED Final   Astrovirus NOT DETECTED NOT DETECTED Final   Norovirus GI/GII NOT DETECTED NOT DETECTED Final   Rotavirus A NOT DETECTED NOT DETECTED Final   Sapovirus (I, II, IV, and V) NOT DETECTED NOT DETECTED Final    Comment: Performed at [facility], [address] [place], [place] [address]      Radiology Studies last 3 days: CT HEAD WO CONTRAST ( )  Result Date: 06/24/2023 CLINICAL DATA:  Neuro deficit, acute, stroke suspected. EXAM: CT HEAD WITHOUT CONTRAST TECHNIQUE: Contiguous axial images were obtained from the base of the skull through the vertex without intravenous contrast. RADIATION DOSE REDUCTION: This exam was performed according to the departmental dose-optimization program which includes automated exposure control, adjustment of the mA and/or kV  according to patient size and/or use of iterative reconstruction technique. COMPARISON:  CT head without contrast 06/03/2023 FINDINGS: Brain: A remote anterior right frontal lobe infarct is stable. Chronic atrophy and white matter disease is unchanged. Remote infarct of the right occipital lobe is stable. Ex vacuo dilation of the right lateral ventricle is noted both anteriorly and posteriorly. No acute infarct, hemorrhage, or mass lesion is present. Cyst Remote left posterior cerebellar infarcts are present. The brainstem and cerebellum are otherwise normal. Midline structures are within normal limits. Vascular: Atherosclerotic calcifications are again noted within the cavernous internal carotid arteries bilaterally. No hyperdense vessel is present. Skull: Calvarium is intact. Calvarium is intact. No focal lytic or blastic lesions are present. No significant extracranial soft tissue lesion is present. Sinuses/Orbits: A polyp or mucous retention cyst is present in the right sphenoid sinus. A remote right orbital blowout fracture is again noted. The paranasal sinuses and mastoid air cells are otherwise clear. Globes and orbits are otherwise within normal limits bilaterally. IMPRESSION: 1. No acute intracranial abnormality or significant interval change. 2. Stable remote anterior right frontal lobe infarct. 3. Stable remote right occipital lobe infarct. 4. Remote left posterior cerebellar infarcts. Electronically Signed   By: Marin Roberts M.D.   On: 06/24/2023 15:12   DG Abd Portable 1V-Small Bowel Obstruction Protocol-initial, 8 hr delay  Result Date: 06/22/2023 CLINICAL DATA:  Small bowel obstruction, 8 hour delay EXAM:  PORTABLE ABDOMEN - 1 VIEW COMPARISON:  Radiographs and CT earlier today. FINDINGS: Enteric contrast is seen within the ascending colon. Contrast within the left colon was present on prior CT. Persistent but diminishing gaseous small bowel distension centrally. Tip of the enteric tube may  be within the proximal duodenum. IMPRESSION: Enteric contrast in the ascending colon. Persistent bed improving gaseous small bowel distension. Findings are suggestive of partial or resolving small bowel obstruction. Electronically Signed   By: Narda Rutherford M.D.   On: 06/22/2023 21:03   DG Abd Portable 1 View  Result Date: 06/22/2023 CLINICAL DATA:  Advancement of nasogastric tube. EXAM: PORTABLE ABDOMEN - 1 VIEW COMPARISON:  Earlier today FINDINGS: Tip and side port of the enteric tube below the diaphragm in the stomach. Gaseous small bowel distension again seen. Dense barium in the left colon. IMPRESSION: Tip and side port of the enteric tube below the diaphragm in the stomach. Electronically Signed   By: Narda Rutherford M.D.   On: 06/22/2023 09:18   DG Abd Portable 1V-Small Bowel Protocol-Position Verification  Result Date: 06/22/2023 CLINICAL DATA:  Nasogastric tube placement. EXAM: PORTABLE ABDOMEN - 1 VIEW COMPARISON:  CT earlier today FINDINGS: Tip and side port of the enteric tube below the diaphragm in the stomach. Small bowel dilatation in the upper abdomen. Dense barium in the left colon. IMPRESSION: Tip and side port of the enteric tube below the diaphragm in the stomach. Electronically Signed   By: Narda Rutherford M.D.   On: 06/22/2023 09:17   CT ABDOMEN PELVIS W CONTRAST  Result Date: 06/22/2023 CLINICAL DATA:  Bowel obstruction suspected.  Colostomy patient. EXAM: CT ABDOMEN AND PELVIS WITH CONTRAST TECHNIQUE: Multidetector CT imaging of the abdomen and pelvis was performed using the standard protocol following bolus administration of intravenous contrast. RADIATION DOSE REDUCTION: This exam was performed according to the departmental dose-optimization program which includes automated exposure control, adjustment of the mA and/or kV according to patient size and/or use of iterative reconstruction technique. CONTRAST:  OMNIPAQUE IOHEXOL 300 MG/ML  SOLN COMPARISON:  CTs with IV  contrast 06/12/2022 and 05/31/2022 FINDINGS: Lower chest: Lung bases again show scattered linear scarring or atelectasis. Chronic moderate elevation right hemidiaphragm. No focal infiltrate. There is mild cardiomegaly. Scattered calcific plaque LAD coronary artery. Small hiatal hernia. Hepatobiliary: Occasional calcified granulomas in the liver. No mass enhancement. Portal venous gas is present in the parenchyma predominantly in the left-greater-than-right lower lobes. There are tiny stones layering in the proximal gallbladder, without wall thickening or biliary dilatation. Pancreas: No abnormality. Spleen: No abnormality. Adrenals/Urinary Tract: Stable bilateral adrenal nodules, 2.8 cm on the left and 2 cm on MRI. Stable in size back to a CTA abdomen and pelvis from 09/30/2017. No follow-up imaging is recommended. Patchy cortical scarring in both kidneys is again noted without mass enhancement, urinary stones or obstruction. The bladder is unremarkable for the degree of distention. Stomach/Bowel: Suspect gastric wall pneumatosis posteriorly. The stomach is dilated with fluid, small-bowel obstruction continuing into the jejunum are multiple upper to mid abdominal segments. The transitional segment is probably in the distal 1/3 of the ileum. There is a sharply angulated transitional segment in the left mid abdomen series 2 axial images 42-46 and on coronal reconstruction series 5 images 36-40 with likely etiology of adhesive disease, and collapse of the downstream small bowel segments. There is no appreciable small or large bowel pneumatosis. There is fluid in the ascending and transverse colon and new demonstration of a loop colostomy of the  distal transverse colon through the left mid abdominal wall. 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 No overt thickening of the colon is seen. There is dense stool and barium in the descending colon, rectosigmoid surgical anastomosis. Vascular/Lymphatic: Aortic atherosclerosis. No  enlarged abdominal or pelvic lymph nodes. Reproductive: No prostatomegaly. Other: No free fluid, free hemorrhage, free air or focal inflammatory process. There is no incarcerated hernia. Subcutaneous air in the anterior left lower abdomen is probably due to a recent injection. Musculoskeletal: There is streak artifact from again noted left hip arthroplasty. Osteopenia and degenerative changes of the spine. Sclerotic lesion left L4 pedicle and transverse process is chronically seen. IMPRESSION: 1. High-grade small-bowel obstruction with transition probably in the distal 1/3 of the ileum, with a sharply angulated transitional segment in the left mid abdomen, likely due to adhesive disease. 2. Portal venous gas in the liver, with suspected gastric wall pneumatosis posteriorly. This can be seen with GI tract ischemia. No pneumatosis is seen in the bowel. 3. Fluid in the ascending and transverse colon but no overt thickening of the colon. 4. 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 5. Aortic and coronary artery atherosclerosis. 6. Cholelithiasis. 7. Stable bilateral adrenal nodules. 8. Stable sclerotic lesion left L4 pedicle and transverse process. 9. Subcutaneous air in the anterior left lower abdomen, probably due to a recent injection. 10. Critical Value/emergent results were called by telephone at the time of interpretation on 06/22/2023 at 6:08 am to provider MARK QUALE , who verbally acknowledged these results. Aortic Atherosclerosis (ICD10-I70.0). Electronically Signed   By: Almira Bar M.D.   On: 06/22/2023 06:08   DG Chest Port 1 View  Result Date: 06/22/2023 CLINICAL DATA:  Abdominal pain EXAM: PORTABLE CHEST 1 VIEW COMPARISON:  07/01/2022 FINDINGS: Normal heart size and mediastinal contours. Chronic elevation of the right diaphragm. No acute infiltrate or edema. No effusion or pneumothorax. No acute osseous findings. IMPRESSION: No active disease. Electronically  Signed   By: Tiburcio Pea M.D.   On: 06/22/2023 05:04        Sunnie Nielsen, DO [facility] 06/25/2023, 1:20 PM    Dictation software may have been used to generate the above note. Typos may occur and escape review in typed/dictated notes. Please contact Dr Lyn Hollingshead directly for clarity if needed.  Staff may message me via secure chat in Epic  but this may not receive an immediate response,  please page me for urgent matters!  If 7PM-7AM, please contact night coverage [email or web address]

## 2023-06-25 NOTE — Progress Notes (Signed)
PHARMACIST - PHYSICIAN COMMUNICATION  CONCERNING: IV to Oral Route Change Policy  RECOMMENDATION: This patient is receiving pantoprazole by the intravenous route.  Based on criteria approved by the Pharmacy and Therapeutics Committee, the intravenous medication(s) is/are being converted to the equivalent oral dose form(s).  DESCRIPTION: These criteria include: The patient is eating (either orally or via tube) and/or has been taking other orally administered medications for a least 24 hours The patient has no evidence of active gastrointestinal bleeding or impaired GI absorption (gastrectomy, short bowel, patient on TNA or NPO).  If you have questions about this conversion, please contact the Pharmacy Department   Tressie Ellis, [facility] 06/25/2023 11:20 AM

## 2023-06-25 NOTE — [place] FL2 (Signed)
[place] MEDICAID FL2 LEVEL OF CARE FORM     IDENTIFICATION  [name] Birthdate: 03-Nov-1950 Sex: male Admission Date (Current Location): 06/22/2023  [facility] and [place] Number:  Chiropodist and Address:         Provider Number: [id number]  Attending Physician Name and Address:  Sunnie Nielsen, DO  Relative Name and Phone Number:       Current Level of Care: Hospital Recommended Level of Care: Nursing Facility Prior Approval Number:    Date Approved/Denied:   PASRR Number: [id number]  Discharge Plan: SNF    Current Diagnoses: Patient Active Problem List   Diagnosis Date Noted   Headache 06/18/2022   Acute kidney injury superimposed on CKD (HCC) 06/17/2022   Dysphagia 06/07/2022   Respiratory distress 06/02/2022   Neuropathy 06/02/2022   Paroxysmal atrial flutter (HCC) 06/01/2022   Aspiration pneumonia (HCC) 06/01/2022   Cervical spinal stenosis 05/31/2022   Chronic diastolic CHF (congestive heart failure) (HCC) 05/28/2022   SBO (small bowel obstruction) (HCC)    Abdominal distention    HLD (hyperlipidemia) 10/16/2021   Nausea vomiting and diarrhea 10/16/2021   Sepsis (HCC) 10/16/2021   Tobacco abuse 10/16/2021   Muscle twitching 08/14/2019   UTI (urinary tract infection) 08/03/2019   Ileus (HCC) 08/03/2019   Hypokalemia 08/03/2019   QT prolongation 08/03/2019   Ogilvie syndrome    Acute abdominal pain 04/03/2019   Left-sided weakness 09/30/2017   Seizures (HCC) 09/30/2017   HTN (hypertension) 09/30/2017   CAD (coronary artery disease) 09/30/2017   COPD (chronic obstructive pulmonary disease) (HCC) 09/30/2017   Depression with anxiety 09/30/2017    Orientation RESPIRATION BLADDER Height & Weight     Self, Time, Situation, Place  Normal Incontinent Weight: 98 kg Height:  5' 10.98" (180.3 cm)  BEHAVIORAL SYMPTOMS/MOOD NEUROLOGICAL BOWEL NUTRITION STATUS      Colostomy Diet (Full.  Will advance prior to discharge)   AMBULATORY STATUS COMMUNICATION OF NEEDS Skin   Extensive Assist Verbally Bruising                       Personal Care Assistance Level of Assistance              Functional Limitations Info             SPECIAL CARE FACTORS FREQUENCY                       Contractures Contractures Info: Not present    Additional Factors Info  Code Status, Allergies Code Status Info: Full Allergies Info: NKDA           Current Medications (06/25/2023):  This is the current hospital active medication list Current Facility-Administered Medications  Medication Dose Route Frequency Provider Last Rate Last Admin   acetaminophen (TYLENOL) tablet 650 mg  650 mg Oral Q6H PRN Tressie Ellis, RPH       Or   acetaminophen (TYLENOL) suppository 650 mg  650 mg Rectal Q6H PRN Tressie Ellis, RPH       aspirin EC tablet 81 mg  81 mg Oral Daily Sunnie Nielsen, DO   81 mg at 06/25/23 4034   baclofen (LIORESAL) tablet 5 mg  5 mg Oral BID Sunnie Nielsen, DO   5 mg at 06/25/23 7425   bisacodyl (DULCOLAX) suppository 10 mg  10 mg Rectal Daily PRN Sunnie Nielsen, DO       calcium carbonate (TUMS - dosed in  mg elemental calcium) chewable tablet 200 mg of elemental calcium  1 tablet Oral TID PRN Mansy, Jan A, MD       citalopram (CELEXA) tablet 10 mg  10 mg Oral Daily Sunnie Nielsen, DO   10 mg at 06/25/23 0854   enoxaparin (LOVENOX) injection 50 mg  0.5 mg/kg Subcutaneous Q24H Mikey College T, MD   50 mg at 06/24/23 1744   furosemide (LASIX) tablet 20 mg  20 mg Oral Daily Sunnie Nielsen, DO   20 mg at 06/25/23 0853   guaiFENesin (MUCINEX) 12 hr tablet 600 mg  600 mg Oral BID Sunnie Nielsen, DO   600 mg at 06/25/23 0853   guaiFENesin-dextromethorphan (ROBITUSSIN DM) 100-10 MG/5ML syrup 5 mL  5 mL Oral Q4H PRN Sunnie Nielsen, DO   5 mL at 06/24/23 2104   hydrALAZINE (APRESOLINE) injection 5 mg  5 mg Intravenous Q6H PRN Emeline General, MD       lactulose (CHRONULAC) 10  GM/15ML solution 20 g  20 g Oral Daily Sunnie Nielsen, DO   20 g at 06/25/23 1610   lamoTRIgine (LAMICTAL) tablet 50 mg  50 mg Oral BID Sunnie Nielsen, DO   50 mg at 06/25/23 9604   levETIRAcetam (KEPPRA) tablet 750 mg  750 mg Oral BID Sunnie Nielsen, DO   750 mg at 06/25/23 5409   loratadine (CLARITIN) tablet 10 mg  10 mg Oral Daily Sunnie Nielsen, DO   10 mg at 06/25/23 8119   nicotine (NICODERM CQ - dosed in mg/24 hours) patch 14 mg  14 mg Transdermal Daily Mikey College T, MD       ondansetron [facility]) injection 4 mg  4 mg Intravenous Q6H PRN Mikey College T, MD       oxyCODONE (Oxy IR/ROXICODONE) immediate release tablet 5 mg  5 mg Oral Q6H PRN Sunnie Nielsen, DO       [START ON 06/26/2023] pantoprazole (PROTONIX) EC tablet 40 mg  40 mg Oral Daily Tressie Ellis, RPH       pregabalin (LYRICA) capsule 100 mg  100 mg Oral QHS Hunt, Madison H, RPH   100 mg at 06/24/23 2103   pregabalin (LYRICA) capsule 50 mg  50 mg Oral BID Merryl Hacker, RPH   50 mg at 06/25/23 1478   simvastatin (ZOCOR) tablet 20 mg  20 mg Oral QHS Sunnie Nielsen, DO       tamsulosin [facility]) capsule 0.4 mg  0.4 mg Oral QPM Sunnie Nielsen, DO       tiotropium [facility]) inhalation capsule [facility] use ONLY) 18 mcg  18 mcg Inhalation Daily Mikey College T, MD   18 mcg at 06/25/23 0855   traZODone (DESYREL) tablet 25 mg  25 mg Oral QHS PRN Mansy, Jan A, MD   25 mg at 06/24/23 2104     Discharge Medications: Please see discharge summary for a list of discharge medications.  Relevant Imaging Results:  Relevant Lab Results:   Additional Information SS#: [id number]  Chapman Fitch, RN

## 2023-06-25 NOTE — Evaluation (Signed)
Occupational Therapy Evaluation Patient Details Name: Calvin [name] MRN: [id number] DOB: 04-01-1951 Today's Date: 06/25/2023   History of Present Illness Pt is a 72 y/o male admitted 06/22/23 for small bowel obstruction. PmHx includes: stroke with left sided weakness, HTN, COPD, and CAD/MI.   Clinical Impression   Pt was seen for OT evaluation this date. Prior to hospital admission, pt was a LTC resident at [facility] and receiving therapy services at facility. He reports being put in shower 2x/wk and otherwise bathed in the bed. He had assistance with dressing and reports using a brief for toileting needs. He reports ongoing LUE weakness/pain since previous CVA.  Pt presents to acute OT demonstrating impaired ADL performance and functional mobility 2/2 weakness, pain and balance deficits (See OT problem list for additional functional deficits). Pt found seated in recliner following working with PT. Reports some pain in LUE with movement. Pt currently requires Set up assist for washing his face seated in recliner. Mod A for UB bathing d/t limited use of L hand and inability to reach under his R arm. He needed Min A, verb cues for hand placement for STS from recliner. Able to clean peri-area with CGA and unilateral support on RW, otherwise needed Max to Mod A for LB bathing of buttocks and below his knees. Linens changed d/t soiled and pt left seated in recliner with all needs in place. LUE ROM WFL, limited L hand grip strength 3 to 3+/5 with most difficulty noted in holding a cup, washcloth, etc.  Pt would benefit from skilled OT services to address noted impairments and functional limitations (see below for any additional details) in order to maximize safety and independence while minimizing falls risk and caregiver burden. Do anticipate the need for follow up OT services upon acute hospital DC, recommending return to SNF.       If plan is discharge home, recommend the following: A lot of help with  bathing/dressing/bathroom;Direct supervision/assist for medications management;Direct supervision/assist for financial management;A lot of help with walking and/or transfers;Assistance with cooking/housework;[name];Assist for transportation    Functional Status Assessment  Patient has had a recent decline in their functional status and demonstrates the ability to make significant improvements in function in a reasonable and predictable amount of time.  Equipment Recommendations  Other (comment) (defer to next venue)    Recommendations for Other Services       Precautions / Restrictions Precautions Precautions: Fall Restrictions Weight Bearing Restrictions: No      Mobility Bed Mobility               General bed mobility comments: up in chair on arrival    Transfers Overall transfer level: Needs assistance   Transfers: Sit to/from Stand Sit to Stand: Min assist           General transfer comment: Min A with cues to push up from recliner for STS      Balance Overall balance assessment: Needs assistance   Sitting balance-Leahy Scale: Good Sitting balance - Comments: good seated balance in recliner during bathing and dressing tasks   Standing balance support: Bilateral upper extremity supported, Single extremity supported Standing balance-Leahy Scale: Fair Standing balance comment: able to stand with unilateral support on RW while OT provided CGA for him to bathe his peri-area                           ADL either performed or  assessed with clinical judgement   ADL Overall ADL's : Needs assistance/impaired     Grooming: Wash/dry face;Set up;Sitting   Upper Body Bathing: Moderate assistance;Sitting   Lower Body Bathing: Maximal assistance;Sit to/from stand;Sitting/lateral leans                               Vision         Perception         Praxis         Pertinent Vitals/Pain Pain  Assessment Pain Assessment: Faces Faces Pain Scale: Hurts a little bit Pain Location: L hand/UE Pain Intervention(s): Monitored during session     Extremity/Trunk Assessment Upper Extremity Assessment Upper Extremity Assessment: Right hand dominant;Generalized weakness;LUE deficits/detail LUE Deficits / Details: pt reports ongoing LUE weakness specifically with his L hand grip strength 3 to 3+/5. Reports he has putty and a ball at rehab facility he was using; ROM WFL LUE: Shoulder pain with ROM LUE Coordination: decreased fine motor   Lower Extremity Assessment Lower Extremity Assessment: Generalized weakness LLE Deficits / Details: Generalized weakness post stroke.       Communication Communication Communication: No apparent difficulties Cueing Techniques: Verbal cues   Cognition Arousal: Alert Behavior During Therapy: WFL for tasks assessed/performed Overall Cognitive Status: Within Functional Limits for tasks assessed                                 General Comments: appeared oriented to person, place and situation     General Comments  purewick, ostomy    Exercises     Shoulder Instructions      Home Living Family/patient expects to be discharged to:: Skilled nursing facility                                 Additional Comments: While at facility, pt reports that they have been receiving PT sessions for their left sided weakness.      Prior Functioning/Environment Prior Level of Function : Needs assist  Cognitive Assist : Mobility (cognitive) Mobility (Cognitive): Intermittent cues ADLs (Cognitive): Intermittent cues Physical Assist : Mobility (physical) Mobility (physical): Bed mobility;Transfers;Gait   Mobility Comments: uses W/C most of the time per report and reports being able to SPT from bed<>W/C ADLs Comments: says he has assistance with a shower 2x/wk and otherwise they bathe him in bed        OT Problem List:  Decreased strength;Pain;Impaired balance (sitting and/or standing);Impaired UE functional use;Decreased activity tolerance      OT Treatment/Interventions: Self-care/ADL training;Therapeutic exercise;Therapeutic activities;Patient/family education;Balance training    OT Goals(Current goals can be found in the care plan section) Acute Rehab OT Goals Patient Stated Goal: return to rehab facility OT Goal Formulation: With patient Time For Goal Achievement: 07/09/23 Potential to Achieve Goals: Good ADL Goals Pt Will Perform Upper Body Bathing: with min assist;sitting Pt Will Perform Upper Body Dressing: with min assist;sitting Pt Will Perform Lower Body Dressing: with mod assist;sit to/from stand Pt/caregiver will Perform Home Exercise Program: Increased ROM;Increased strength;With theraputty;With Supervision;With written HEP provided;Left upper extremity  OT Frequency: Min 1X/week    Co-evaluation              AM-PAC OT "6 Clicks" Daily Activity     Outcome Measure Help from another person eating meals?: None Help from  another person taking care of personal grooming?: None Help from another person toileting, which includes using toliet, bedpan, or urinal?: A Little Help from another person bathing (including washing, rinsing, drying)?: A Lot Help from another person to put on and taking off regular upper body clothing?: A Lot Help from another person to put on and taking off regular lower body clothing?: A Lot 6 Click Score: 17   End of Session Equipment Utilized During Treatment: Rolling walker (2 wheels)  Activity Tolerance: Patient tolerated treatment well Patient left: in chair;with call bell/phone within reach;with chair alarm set  OT Visit Diagnosis: Unsteadiness on feet (R26.81);Muscle weakness (generalized) (M62.81);Other abnormalities of gait and mobility (R26.89)                Time: 4098-1191 OT Time Calculation (min): 33 min Charges:  OT General Charges $OT Visit:  1 Visit OT Evaluation $OT Eval Moderate Complexity: 1 Mod OT Treatments $Self Care/Home Management : 8-22 mins Lonette Stevison, OTR/L 06/25/23, 12:43 PM  Herve Haug E Kameron Blethen 06/25/2023, 12:38 PM

## 2023-06-25 NOTE — Progress Notes (Signed)
Subjective:  CC: Calvin [name] is a 72 y.o. male  Hospital stay day 3,   portal venous gas and SBO  HPI: No complaints today. Denies nausea/vomiting. Report of no significant output from ostomy  ROS:  General: Denies weight loss, weight gain, fatigue, fevers, chills, and night sweats. Heart: Denies chest pain, palpitations, racing heart, irregular heartbeat, leg pain or swelling, and decreased activity tolerance. Respiratory: Denies breathing difficulty, shortness of breath, wheezing, cough, and sputum. GI: Denies change in appetite, heartburn, nausea, vomiting, constipation, diarrhea, and blood in stool. GU: Denies difficulty urinating, pain with urinating, urgency, frequency, blood in urine.   Objective:   Temp:  [97.6 F (36.4 C)-98.3 F (36.8 C)] 98.2 F (36.8 C) (10/21 0340) Pulse Rate:  [61-83] 61 (10/21 0340) Resp:  [16-18] 16 (10/21 0340) BP: (128-175)/(70-96) 128/70 (10/21 0340) SpO2:  [93 %-99 %] 93 % (10/21 0340)     Height: 5' 10.98" (180.3 cm) Weight: 98 kg BMI (Calculated): 30.15   Intake/Output this shift:   Intake/Output Summary (Last 24 hours) at 06/25/2023 0754 Last data filed at 06/24/2023 2220 Gross per 24 hour  Intake 240 ml  Output 1500 ml  Net -1260 ml    Constitutional :  alert, cooperative, appears stated age, and no distress  Respiratory:  clear to auscultation bilaterally  Cardiovascular:  regular rate and rhythm  Gastrointestinal: soft, non-tender; bowel sounds normal; no masses,  no organomegaly. Ostomy pink  Skin: Cool and moist.   Psychiatric: Normal affect, non-agitated, not confused       LABS:     Latest Ref Rng & Units 06/23/2023    5:13 AM 06/22/2023    4:09 AM 06/03/2023   12:08 PM  CMP  Glucose 70 - 99 mg/dL 846  962  98   BUN 8 - 23 mg/dL 22  26  9    Creatinine 0.61 - 1.24 mg/dL 9.52  8.41  3.24   Sodium 135 - 145 mmol/L 145  140  142   Potassium 3.5 - 5.1 mmol/L 3.7  3.8  4.4   Chloride 98 - 111 mmol/L 111  104  105    CO2 22 - 32 mmol/L 24  24  31    Calcium 8.9 - 10.3 mg/dL 8.5  40.1  8.9   Total Protein 6.5 - 8.1 g/dL  9.3  7.1   Total Bilirubin 0.3 - 1.2 mg/dL  0.5  0.6   Alkaline Phos 38 - 126 U/L  69  56   AST 15 - 41 U/L  16  13   ALT 0 - 44 U/L  14  10       Latest Ref Rng & Units 06/23/2023    5:13 AM 06/22/2023    4:09 AM 06/03/2023   12:08 PM  CBC  WBC 4.0 - 10.5 K/uL 10.1  13.0  3.5   Hemoglobin 13.0 - 17.0 g/dL 02.7  25.3  66.4   Hematocrit 39.0 - 52.0 % 41.7  47.0  41.0   Platelets 150 - 400 K/uL 186  245  172     RADS: N/a  Assessment:   portal venous gas and SBO  Stable.  No recurrent pain but having some low ostomy output.  Recommend full liquid diet today again just to make sure no recurrent issues.  labs/images/medications/previous chart entries reviewed personally and relevant changes/updates noted above.

## 2023-06-26 DIAGNOSIS — K56609 Unspecified intestinal obstruction, unspecified as to partial versus complete obstruction: Secondary | ICD-10-CM | POA: Diagnosis not present

## 2023-06-26 LAB — BASIC METABOLIC PANEL
Anion gap: 9 (ref 5–15)
BUN: 11 mg/dL (ref 8–23)
CO2: 26 mmol/L (ref 22–32)
Calcium: 8.6 mg/dL — ABNORMAL LOW (ref 8.9–10.3)
Chloride: 104 mmol/L (ref 98–111)
Creatinine, Ser: 0.9 mg/dL (ref 0.61–1.24)
GFR, Estimated: >60 mL/min (ref >60)
Glucose, Bld: 108 mg/dL — ABNORMAL HIGH (ref 70–99)
Potassium: 3.8 mmol/L (ref 3.5–5.1)
Sodium: 139 mmol/L (ref 135–145)

## 2023-06-26 LAB — CBC
HCT: 38.5 % — ABNORMAL LOW (ref 39.0–52.0)
Hemoglobin: 12 g/dL — ABNORMAL LOW (ref 13.0–17.0)
MCH: 28.8 pg (ref 26.0–34.0)
MCHC: 31.2 g/dL (ref 30.0–36.0)
MCV: 92.3 fL (ref 80.0–100.0)
Platelets: 186 10*3/uL (ref 150–400)
RBC: 4.17 MIL/uL — ABNORMAL LOW (ref 4.22–5.81)
RDW: 12.3 % (ref 11.5–15.5)
WBC: 6.4 10*3/uL (ref 4.0–10.5)
nRBC: 0 % (ref 0.0–0.2)

## 2023-06-26 MED ORDER — HYDROCODONE-ACETAMINOPHEN 5-325 MG PO TABS: 1.0000 | ORAL_TABLET | Freq: Four times a day (QID) | ORAL | 0 refills | Status: DC | PRN

## 2023-06-26 MED ORDER — SENNA 8.6 MG PO TABS: 2.0000 | ORAL_TABLET | Freq: Every evening | ORAL | Status: DC | PRN

## 2023-06-26 MED ORDER — BISACODYL 10 MG RE SUPP: 10.0000 mg | Freq: Every day | RECTAL | Status: DC | PRN

## 2023-06-26 MED ORDER — PREGABALIN 50 MG PO CAPS: ORAL_CAPSULE | ORAL | 0 refills | Status: DC

## 2023-06-26 MED ORDER — POLYETHYLENE GLYCOL 3350 17 G PO PACK
17.0000 g | PACK | Freq: Every day | ORAL | Status: DC
  Administered 2023-06-26: 17 g via ORAL
  Filled 2023-06-26: qty 1

## 2023-06-26 NOTE — Progress Notes (Signed)
Subjective:  CC: Calvin [name] is a 72 y.o. male  Hospital stay day 4,   portal venous gas and SBO  HPI: Still no complaints today. Denies nausea/vomiting.  Small amount of ostomy output noted  ROS:  General: Denies weight loss, weight gain, fatigue, fevers, chills, and night sweats. Heart: Denies chest pain, palpitations, racing heart, irregular heartbeat, leg pain or swelling, and decreased activity tolerance. Respiratory: Denies breathing difficulty, shortness of breath, wheezing, cough, and sputum. GI: Denies change in appetite, heartburn, nausea, vomiting, constipation, diarrhea, and blood in stool. GU: Denies difficulty urinating, pain with urinating, urgency, frequency, blood in urine.   Objective:   Temp:  [97.9 F (36.6 C)-99.4 F (37.4 C)] 98 F (36.7 C) (10/22 0732) Pulse Rate:  [59-71] 59 (10/22 0732) Resp:  [16-18] 16 (10/22 0732) BP: (125-163)/(75-94) 141/81 (10/22 0732) SpO2:  [95 %-100 %] 95 % (10/22 0732)     Height: 5' 10.98" (180.3 cm) Weight: 98 kg BMI (Calculated): 30.15   Intake/Output this shift:   Intake/Output Summary (Last 24 hours) at 06/26/2023 0859 Last data filed at 06/25/2023 1952 Gross per 24 hour  Intake --  Output 1450 ml  Net -1450 ml    Constitutional :  alert, cooperative, appears stated age, and no distress  Respiratory:  clear to auscultation bilaterally  Cardiovascular:  regular rate and rhythm  Gastrointestinal: soft, non-tender; bowel sounds normal; no masses,  no organomegaly. Ostomy pink, small amount of stool  Skin: Cool and moist.   Psychiatric: Normal affect, non-agitated, not confused       LABS:     Latest Ref Rng & Units 06/26/2023    4:44 AM 06/23/2023    5:13 AM 06/22/2023    4:09 AM  CMP  Glucose 70 - 99 mg/dL 132  440  102   BUN 8 - 23 mg/dL 11  22  26    Creatinine 0.61 - 1.24 mg/dL 7.25  3.66  4.40   Sodium 135 - 145 mmol/L 139  145  140   Potassium 3.5 - 5.1 mmol/L 3.8  3.7  3.8   Chloride 98 - 111 mmol/L  104  111  104   CO2 22 - 32 mmol/L 26  24  24    Calcium 8.9 - 10.3 mg/dL 8.6  8.5  34.7   Total Protein 6.5 - 8.1 g/dL   9.3   Total Bilirubin 0.3 - 1.2 mg/dL   0.5   Alkaline Phos 38 - 126 U/L   69   AST 15 - 41 U/L   16   ALT 0 - 44 U/L   14       Latest Ref Rng & Units 06/26/2023    4:44 AM 06/23/2023    5:13 AM 06/22/2023    4:09 AM  CBC  WBC 4.0 - 10.5 K/uL 6.4  10.1  13.0   Hemoglobin 13.0 - 17.0 g/dL 42.5  95.6  38.7   Hematocrit 39.0 - 52.0 % 38.5  41.7  47.0   Platelets 150 - 400 K/uL 186  186  245     RADS: N/a  Assessment:   portal venous gas and SBO  Stable.  No recurrent pain tolerating full liquid diet with some output today.  Will advance to regular.   labs/images/medications/previous chart entries reviewed personally and relevant changes/updates noted above.

## 2023-06-26 NOTE — Plan of Care (Signed)
  Problem: Education: Goal: Knowledge of General Education information will improve Description: Including pain rating scale, medication(s)/side effects and non-pharmacologic comfort measures Outcome: Adequate for Discharge   Problem: Health Behavior/Discharge Planning: Goal: Ability to manage health-related needs will improve Outcome: Adequate for Discharge   Problem: Clinical Measurements: Goal: Ability to maintain clinical measurements within normal limits will improve Outcome: Adequate for Discharge Goal: Will remain free from infection Outcome: Adequate for Discharge Goal: Diagnostic test results will improve Outcome: Adequate for Discharge Goal: Respiratory complications will improve Outcome: Adequate for Discharge Goal: Cardiovascular complication will be avoided Outcome: Adequate for Discharge   Problem: Activity: Goal: Risk for activity intolerance will decrease Outcome: Adequate for Discharge   Problem: Nutrition: Goal: Adequate nutrition will be maintained Outcome: Adequate for Discharge   Problem: Coping: Goal: Level of anxiety will decrease Outcome: Adequate for Discharge   Problem: Elimination: Goal: Will not experience complications related to bowel motility Outcome: Adequate for Discharge Goal: Will not experience complications related to urinary retention Outcome: Adequate for Discharge   Problem: Pain Managment: Goal: General experience of comfort will improve Outcome: Adequate for Discharge   Problem: Safety: Goal: Ability to remain free from injury will improve Outcome: Adequate for Discharge   Problem: Skin Integrity: Goal: Risk for impaired skin integrity will decrease Outcome: Adequate for Discharge  Pt leaving with [facility] transport team. Transport team has all pt belongings, DC paperwork. Pt report called to [facility] RN @ [facility]

## 2023-06-26 NOTE — Care Management Important Message (Signed)
Important Message  Patient Details  Name: Calvin [name] MRN: [id number] Date of Birth: 03-30-1951   Important Message Given:  Yes - Medicare IM     Chapman Fitch, RN 06/26/2023, 12:40 PM

## 2023-06-26 NOTE — TOC Transition Note (Signed)
Transition of Care [facility]) - CM/SW Discharge Note   Patient Details  Name: Calvin [name] MRN: [id number] Date of Birth: [birth year]  [facility] CM/SW Contact:  Chapman Fitch, RN Phone Number: 06/26/2023, 2:55 PM   Clinical Narrative:      Patient will DC to: [facility] Anticipated DC date: 06/26/23  Family notified: VM left for daughter [name]  Transport [id number]  Per MD patient ready for DC to . RN, patient, and facility notified of DC. Discharge Summary sent to facility. RN given number for report. DC packet on chart. Ambulance transport requested for patient.   Per MD prescriptions signed, and bedside RN to place in DC packet TOC signing off.     Barriers to Discharge: Continued Medical Work up   Patient Goals and CMS Choice   Choice offered to / list presented to : Adult Children  Discharge Placement                         Discharge Plan and Services Additional resources added to the After Visit Summary for       Post Acute Care Choice: Resumption of Svcs/PTA Provider                               Social Determinants of Health (SDOH) Interventions SDOH Screenings   Food Insecurity: No Food Insecurity (06/22/2023)  Housing: Low Risk  (06/22/2023)  Transportation Needs: No Transportation Needs (06/22/2023)  Utilities: Not At Risk (06/22/2023)  Tobacco Use: High Risk (06/23/2023)     Readmission Risk Interventions    06/30/2022    2:25 PM 06/02/2022   12:02 PM 10/17/2021   10:47 AM  Readmission Risk Prevention Plan  Transportation Screening Complete Complete Complete  PCP or Specialist Appt within 3-5 Days   Complete  Social Work Consult for Recovery Care Planning/Counseling   Complete  Palliative Care Screening   Not Applicable  Medication Review [employer])  Complete Complete  PCP or Specialist appointment within 3-5 days of discharge  Complete   HRI or Home Care Consult Complete Complete   SW Recovery  Care/Counseling Consult Complete Complete   Palliative Care Screening Complete Complete   Skilled Nursing Facility Complete Complete

## 2023-06-26 NOTE — Discharge Summary (Signed)
Physician Discharge Summary   Patient: Calvin [name] MRN: [id number]  DOB: 20-Jan-1951   Admit:     Date of Admission: 06/22/2023 Admitted from: SNF   Discharge: Date of discharge: 06/26/23 Disposition: Skilled nursing facility Condition at discharge: good  CODE STATUS: FULL CODE     Discharge Physician: Sunnie Nielsen, DO [facility]     PCP: Lucita Ferrara, MD  Recommendations for Outpatient Follow-up:  Follow up with PCP Lucita Ferrara, MD in 1-2 weeks Please obtain labs/tests: consider CBC, BMP in 1-2 weeks Please follow up on the following pending results: none PCP AND OTHER OUTPATIENT PROVIDERS: SEE BELOW FOR SPECIFIC DISCHARGE INSTRUCTIONS PRINTED FOR PATIENT IN ADDITION TO GENERIC AVS PATIENT INFO     Discharge Instructions     Diet general   Complete by: As directed    Soft diet for 3 days then can resume regular   Increase activity slowly   Complete by: As directed          Discharge Diagnoses: Principal Problem:   SBO (small bowel obstruction) (HCC)       HPI: Calvin [name] is a 72 y.o. 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   Hospital course / significant events:  10/18: to ED,  CT abdomen pelvis showed high-grade SBO with transition set at the left mid abdomen likely adhesion related, although there are signs of portal vein gas and gastric or pneumatosis. General surgeon recs - no ugent surgical intervention, trial NG tube and conservative management. On Abx.  10/19: NG out this morning, advanced to CLD 10/20: advance to FLD. This afternoon rapid response called d/t concern for L arm pain vs  weakness, unclear if truly new weakness (already has partial L hemiparesis) suspect pain, pt is poor historian and seems confused by questions to elucidate whether trouble moving d/t pain or weakness, CT head non-acute, continue neuro checks 10/21: arm back at baseline, nonpainful. Still low colostomy output, remain on FLD.  10/22: colostomy output improved, tolerating solid foods, ok for discharge   Consultants:  General surgery   Procedures/Surgeries: none      ASSESSMENT & PLAN:   SBO - improved/resolved Likely Secondary to bowel adhesions supportive care with pain medication and as needed Zofran Avoid opiates Tolerating regular diet, continue    Question of perforation of bowels, ruled out  CT showed some signs of portal and gas and suspected gastric wall pneumatosis estimated risk of bowel perforation is high in this patient.   d/c abx coverage given improvement  Blood culture NG   CKD stage IIIa Euvolemic, creatinine level about his baseline Monitor BMP   HTN Chronic HFpEF Restarted po home meds   Seizure disorder Contineu home meds    CAD Stroke po ASA and statin    COPD Continue Spiriva and as needed albuterol   Anxiety/depression P.o. medication resumed SSRI .    obese based on BMI: Body mass index is 30.13 kg/m.  Underweight - under 18.5  normal weight - 18.5 to 24.9 overweight - 25 to 29.9 obese - 30 or more        Discharge Instructions  Allergies as of 06/26/2023   No Known Allergies  Medication List     STOP taking these medications    sertraline 50 MG tablet Commonly known as: ZOLOFT       TAKE these medications    acetaminophen 325 MG tablet Commonly known as: TYLENOL Take 650 mg by mouth every 6 (six) hours.   alum & mag hydroxide-simeth 200-200-20 MG/5ML suspension Commonly known as: MAALOX/MYLANTA Take 30 mLs by mouth every 12 (twelve) hours as needed for indigestion or heartburn.   Artificial Tears  83-15 % Oint Place 1 Application into both eyes at bedtime.   Aspercreme Original 10 % cream Generic drug: trolamine salicylate Apply 1 Application topically daily.   aspirin 81 MG chewable tablet Take 81 mg by mouth daily.   baclofen 5 mg Tabs tablet Commonly known as: LIORESAL Take 5 mg by mouth 2 (two) times daily.   BENEFIBER DRINK MIX PO Take 5 g by mouth daily.   bisacodyl 5 MG EC tablet Commonly known as: DULCOLAX Take 5 mg by mouth daily. What changed: Another medication with the same name was changed. Make sure you understand how and when to take each.   bisacodyl 10 MG suppository Commonly known as: DULCOLAX Place 1 suppository (10 mg total) rectally daily as needed for moderate constipation or mild constipation. What changed:  when to take this reasons to take this   Calcium Carb-Cholecalciferol 600-10 MG-MCG Tabs Take 1 tablet by mouth daily.   Calmoseptine 0.44-20.6 % Oint Generic drug: Menthol-Zinc Oxide Apply 1 Application topically 3 (three) times daily.   citalopram 10 MG tablet Commonly known as: CELEXA Take 10 mg by mouth daily.   fluticasone 50 MCG/ACT nasal spray Commonly known as: FLONASE Place 2 sprays into both nostrils daily.   furosemide 20 MG tablet Commonly known as: LASIX Take 1 tablet (20 mg total) by mouth daily.   guaiFENesin 600 MG 12 hr tablet Commonly known as: MUCINEX Take 600 mg by mouth 2 (two) times daily.   HYDROcodone-acetaminophen 5-325 MG tablet Commonly known as: NORCO/VICODIN Take 1 tablet by mouth every 6 (six) hours as needed for severe pain (pain score 7-10). What changed:  when to take this reasons to take this   lactulose 10 GM/15ML solution Commonly known as: CHRONULAC Take 20 g by mouth daily.   lamoTRIgine 25 MG tablet Commonly known as: LAMICTAL Take 50 mg by mouth 2 (two) times daily.   levETIRAcetam 750 MG tablet Commonly known as: KEPPRA Take 750 mg by mouth 2 (two) times daily.    lisinopril 5 MG tablet Commonly known as: ZESTRIL Take 1 tablet (5 mg total) by mouth daily.   loratadine 10 MG tablet Commonly known as: CLARITIN Take 10 mg by mouth daily.   Melatonin 10 MG Tabs Take 10 mg by mouth at bedtime.   metoprolol succinate 25 MG 24 hr tablet Commonly known as: TOPROL-XL Take 25 mg by mouth daily.   naloxone 4 MG/0.1ML Liqd nasal spray kit Commonly known as: NARCAN Place 1 spray into the nose once.   nitroGLYCERIN 0.4 MG SL tablet Commonly known as: NITROSTAT Place 0.4 mg under the tongue every 5 (five) minutes as needed for chest pain.   nystatin powder Commonly known as: MYCOSTATIN/NYSTOP Apply 1 Application topically 3 (three) times daily.   Omega-3 1000 MG Caps Take 1,000 mg by mouth daily.   ondansetron 4 MG disintegrating tablet Commonly known as: ZOFRAN-ODT Take 4 mg by mouth every 8 (eight) hours as needed for nausea or vomiting.   pantoprazole 40 MG tablet  Commonly known as: PROTONIX Take 40 mg by mouth in the morning.   polyethylene glycol 17 g packet Commonly known as: MIRALAX / GLYCOLAX Take 34 g by mouth daily.   pregabalin 50 MG capsule Commonly known as: LYRICA Take 50 mg capsule twice daily and 100 mg (two capsules) at bedtime What changed:  how much to take how to take this when to take this   senna 8.6 MG Tabs tablet Commonly known as: SENOKOT Take 2 tablets (17.2 mg total) by mouth at bedtime as needed for mild constipation or moderate constipation. What changed:  how much to take when to take this reasons to take this   simvastatin 20 MG tablet Commonly known as: ZOCOR Take 20 mg by mouth at bedtime.   tamsulosin 0.4 MG Caps capsule Commonly known as: FLOMAX Take 0.4 mg by mouth every evening.   tiotropium 18 MCG inhalation capsule Commonly known as: SPIRIVA Place 18 mcg into inhaler and inhale daily.   triamcinolone cream 0.1 % Commonly known as: KENALOG Apply 1 Application topically 2 (two)  times daily.          No Known Allergies   Subjective: pt reports pain is better, tolerating solid food except choked a bit on a piece of bacon but no other issues w/ soft foods. No fever/chills, no new weakness, pain controll   Discharge Exam: BP (!) 141/81 (BP Location: Right Arm)   Pulse (!) 59   Temp 98 F (36.7 C) (Oral)   Resp 16   Ht 5' 10.98" (1.803 m)   Wt 98 kg   SpO2 95%   BMI 30.15 kg/m  General: Pt is alert, awake, not in acute distress Cardiovascular: RRR, S1/S2 +, no rubs, no gallops Respiratory: CTA bilaterally, no wheezing, no rhonchi Abdominal: Soft, NT, ND, bowel sounds +WNL, small amount stool in colostomy Extremities: no edema, no cyanosis, weakness L arm/leg stable baseline     The results of significant diagnostics from this hospitalization (including imaging, microbiology, ancillary and laboratory) are listed below for reference.     Microbiology: Recent Results (from the past 240 hour(s))  Blood culture (routine x 2)     Status: None (Preliminary result)   Collection Time: 06/22/23  8:46 AM   Specimen: BLOOD  Result Value Ref Range Status   Specimen Description BLOOD BLOOD RIGHT ARM  Final   Special Requests   Final    BOTTLES DRAWN AEROBIC AND ANAEROBIC Blood Culture adequate volume   Culture   Final    NO GROWTH 4 DAYS Performed at [facility], [address] [place], [place] [address]    Report Status PENDING  Incomplete  Blood culture (routine x 2)     Status: None (Preliminary result)   Collection Time: 06/22/23  8:46 AM   Specimen: BLOOD  Result Value Ref Range Status   Specimen Description BLOOD BLOOD LEFT ARM  Final   Special Requests   Final    BOTTLES DRAWN AEROBIC ONLY Blood Culture adequate volume   Culture   Final    NO GROWTH 4 DAYS Performed at [facility], [address] [place], [place] [address]    Report Status PENDING  Incomplete  Gastrointestinal Panel by PCR , Stool     Status: None    Collection Time: 06/24/23 12:30 AM   Specimen: Stool  Result Value Ref Range Status   Campylobacter species NOT DETECTED NOT DETECTED Final   Plesimonas shigelloides NOT DETECTED NOT DETECTED Final  Salmonella species NOT DETECTED NOT DETECTED Final   Yersinia enterocolitica NOT DETECTED NOT DETECTED Final   Vibrio species NOT DETECTED NOT DETECTED Final   Vibrio cholerae NOT DETECTED NOT DETECTED Final   Enteroaggregative E coli (EAEC) NOT DETECTED NOT DETECTED Final   Enteropathogenic E coli (EPEC) NOT DETECTED NOT DETECTED Final   Enterotoxigenic E coli (ETEC) NOT DETECTED NOT DETECTED Final   Shiga like toxin producing E coli (STEC) NOT DETECTED NOT DETECTED Final   Shigella/Enteroinvasive E coli (EIEC) NOT DETECTED NOT DETECTED Final   Cryptosporidium NOT DETECTED NOT DETECTED Final   Cyclospora cayetanensis NOT DETECTED NOT DETECTED Final   Entamoeba histolytica NOT DETECTED NOT DETECTED Final   Giardia lamblia NOT DETECTED NOT DETECTED Final   Adenovirus F40/41 NOT DETECTED NOT DETECTED Final   Astrovirus NOT DETECTED NOT DETECTED Final   Norovirus GI/GII NOT DETECTED NOT DETECTED Final   Rotavirus A NOT DETECTED NOT DETECTED Final   Sapovirus (I, II, IV, and V) NOT DETECTED NOT DETECTED Final    Comment: Performed at [facility], [address] [place], [place] [address]     Labs: BNP (last 3 results) Recent Labs    07/01/22 0608  BNP 36.2   Basic Metabolic Panel: Recent Labs  Lab 06/22/23 0409 06/23/23 0513 06/26/23 0444  NA 140 145 139  K 3.8 3.7 3.8  CL 104 111 104  CO2 24 24 26   GLUCOSE 121* 106* 108*  BUN 26* 22 11  CREATININE 1.42* 1.21 0.90  CALCIUM 10.0 8.5* 8.6*   Liver Function Tests: Recent Labs  Lab 06/22/23 0409  AST 16  ALT 14  ALKPHOS 69  BILITOT 0.5  PROT 9.3*  ALBUMIN 4.6   Recent Labs  Lab 06/22/23 0409  LIPASE 24   No results for input(s): "AMMONIA" in the last 168 hours. CBC: Recent Labs  Lab 06/22/23 0409  06/23/23 0513 06/26/23 0444  WBC 13.0* 10.1 6.4  HGB 14.7 13.1 12.0*  HCT 47.0 41.7 38.5*  MCV 92.3 92.5 92.3  PLT 245 186 186   Cardiac Enzymes: No results for input(s): "CKTOTAL", "CKMB", "CKMBINDEX", "TROPONINI" in the last 168 hours. BNP: Invalid input(s): "POCBNP" CBG: Recent Labs  Lab 06/24/23 1308  GLUCAP 94   D-Dimer No results for input(s): "DDIMER" in the last 72 hours. Hgb A1c No results for input(s): "HGBA1C" in the last 72 hours. Lipid Profile No results for input(s): "CHOL", "HDL", "LDLCALC", "TRIG", "CHOLHDL", "LDLDIRECT" in the last 72 hours. Thyroid function studies No results for input(s): "TSH", "T4TOTAL", "T3FREE", "THYROIDAB" in the last 72 hours.  Invalid input(s): "FREET3" Anemia work up No results for input(s): "VITAMINB12", "FOLATE", "FERRITIN", "TIBC", "IRON", "RETICCTPCT" in the last 72 hours. Urinalysis    Component Value Date/Time   COLORURINE YELLOW 05/28/2022 1511   APPEARANCEUR CLEAR 05/28/2022 1511   APPEARANCEUR Clear 08/14/2014 0933   LABSPEC 1.015 05/28/2022 1511   LABSPEC 1.015 08/14/2014 0933   PHURINE 5.5 05/28/2022 1511   GLUCOSEU NEGATIVE 05/28/2022 1511   GLUCOSEU Negative 08/14/2014 0933   HGBUR TRACE (A) 05/28/2022 1511   BILIRUBINUR NEGATIVE 05/28/2022 1511   BILIRUBINUR Negative 08/14/2014 0933   KETONESUR NEGATIVE 05/28/2022 1511   PROTEINUR 100 (A) 05/28/2022 1511   UROBILINOGEN 0.2 06/12/2010 0103   NITRITE NEGATIVE 05/28/2022 1511   LEUKOCYTESUR TRACE (A) 05/28/2022 1511   LEUKOCYTESUR Negative 08/14/2014 0933   Sepsis Labs Recent Labs  Lab 06/22/23 0409 06/23/23 0513 06/26/23 0444  WBC 13.0* 10.1 6.4   Microbiology Recent  Results (from the past 240 hour(s))  Blood culture (routine x 2)     Status: None (Preliminary result)   Collection Time: 06/22/23  8:46 AM   Specimen: BLOOD  Result Value Ref Range Status   Specimen Description BLOOD BLOOD RIGHT ARM  Final   Special Requests   Final    BOTTLES  DRAWN AEROBIC AND ANAEROBIC Blood Culture adequate volume   Culture   Final    NO GROWTH 4 DAYS Performed at [facility], [address] [place], [place] [address]    Report Status PENDING  Incomplete  Blood culture (routine x 2)     Status: None (Preliminary result)   Collection Time: 06/22/23  8:46 AM   Specimen: BLOOD  Result Value Ref Range Status   Specimen Description BLOOD BLOOD LEFT ARM  Final   Special Requests   Final    BOTTLES DRAWN AEROBIC ONLY Blood Culture adequate volume   Culture   Final    NO GROWTH 4 DAYS Performed at [facility], [address] [place], [place] [address]    Report Status PENDING  Incomplete  Gastrointestinal Panel by PCR , Stool     Status: None   Collection Time: 06/24/23 12:30 AM   Specimen: Stool  Result Value Ref Range Status   Campylobacter species NOT DETECTED NOT DETECTED Final   Plesimonas shigelloides NOT DETECTED NOT DETECTED Final   Salmonella species NOT DETECTED NOT DETECTED Final   Yersinia enterocolitica NOT DETECTED NOT DETECTED Final   Vibrio species NOT DETECTED NOT DETECTED Final   Vibrio cholerae NOT DETECTED NOT DETECTED Final   Enteroaggregative E coli (EAEC) NOT DETECTED NOT DETECTED Final   Enteropathogenic E coli (EPEC) NOT DETECTED NOT DETECTED Final   Enterotoxigenic E coli (ETEC) NOT DETECTED NOT DETECTED Final   Shiga like toxin producing E coli (STEC) NOT DETECTED NOT DETECTED Final   Shigella/Enteroinvasive E coli (EIEC) NOT DETECTED NOT DETECTED Final   Cryptosporidium NOT DETECTED NOT DETECTED Final   Cyclospora cayetanensis NOT DETECTED NOT DETECTED Final   Entamoeba histolytica NOT DETECTED NOT DETECTED Final   Giardia lamblia NOT DETECTED NOT DETECTED Final   Adenovirus F40/41 NOT DETECTED NOT DETECTED Final   Astrovirus NOT DETECTED NOT DETECTED Final   Norovirus GI/GII NOT DETECTED NOT DETECTED Final   Rotavirus A NOT DETECTED NOT DETECTED Final   Sapovirus (I, II, IV, and V)  NOT DETECTED NOT DETECTED Final    Comment: Performed at [facility], [address] [place], [place] [address]   Imaging DG Abd Portable 1V-Small Bowel Obstruction Protocol-initial, 8 hr delay  Result Date: 06/22/2023 CLINICAL DATA:  Small bowel obstruction, 8 hour delay EXAM: PORTABLE ABDOMEN - 1 VIEW COMPARISON:  Radiographs and CT earlier today. FINDINGS: Enteric contrast is seen within the ascending colon. Contrast within the left colon was present on prior CT. Persistent but diminishing gaseous small bowel distension centrally. Tip of the enteric tube may be within the proximal duodenum. IMPRESSION: Enteric contrast in the ascending colon. Persistent bed improving gaseous small bowel distension. Findings are suggestive of partial or resolving small bowel obstruction. Electronically Signed   By: Narda Rutherford M.D.   On: 06/22/2023 21:03   DG Abd Portable 1 View  Result Date: 06/22/2023 CLINICAL DATA:  Advancement of nasogastric tube. EXAM: PORTABLE ABDOMEN - 1 VIEW COMPARISON:  Earlier today FINDINGS: Tip and side port of the enteric tube below the diaphragm in the stomach. Gaseous small bowel distension again seen.  Dense barium in the left colon. IMPRESSION: Tip and side port of the enteric tube below the diaphragm in the stomach. Electronically Signed   By: Narda Rutherford M.D.   On: 06/22/2023 09:18   DG Abd Portable 1V-Small Bowel Protocol-Position Verification  Result Date: 06/22/2023 CLINICAL DATA:  Nasogastric tube placement. EXAM: PORTABLE ABDOMEN - 1 VIEW COMPARISON:  CT earlier today FINDINGS: Tip and side port of the enteric tube below the diaphragm in the stomach. Small bowel dilatation in the upper abdomen. Dense barium in the left colon. IMPRESSION: Tip and side port of the enteric tube below the diaphragm in the stomach. Electronically Signed   By: Narda Rutherford M.D.   On: 06/22/2023 09:17   CT ABDOMEN PELVIS W CONTRAST  Result Date: 06/22/2023 CLINICAL  DATA:  Bowel obstruction suspected.  Colostomy patient. EXAM: CT ABDOMEN AND PELVIS WITH CONTRAST TECHNIQUE: Multidetector CT imaging of the abdomen and pelvis was performed using the standard protocol following bolus administration of intravenous contrast. RADIATION DOSE REDUCTION: This exam was performed according to the departmental dose-optimization program which includes automated exposure control, adjustment of the mA and/or kV according to patient size and/or use of iterative reconstruction technique. CONTRAST:  OMNIPAQUE IOHEXOL 300 MG/ML  SOLN COMPARISON:  CTs with IV contrast 06/12/2022 and 05/31/2022 FINDINGS: Lower chest: Lung bases again show scattered linear scarring or atelectasis. Chronic moderate elevation right hemidiaphragm. No focal infiltrate. There is mild cardiomegaly. Scattered calcific plaque LAD coronary artery. Small hiatal hernia. Hepatobiliary: Occasional calcified granulomas in the liver. No mass enhancement. Portal venous gas is present in the parenchyma predominantly in the left-greater-than-right lower lobes. There are tiny stones layering in the proximal gallbladder, without wall thickening or biliary dilatation. Pancreas: No abnormality. Spleen: No abnormality. Adrenals/Urinary Tract: Stable bilateral adrenal nodules, 2.8 cm on the left and 2 cm on MRI. Stable in size back to a CTA abdomen and pelvis from 09/30/2017. No follow-up imaging is recommended. Patchy cortical scarring in both kidneys is again noted without mass enhancement, urinary stones or obstruction. The bladder is unremarkable for the degree of distention. Stomach/Bowel: Suspect gastric wall pneumatosis posteriorly. The stomach is dilated with fluid, small-bowel obstruction continuing into the jejunum are multiple upper to mid abdominal segments. The transitional segment is probably in the distal 1/3 of the ileum. There is a sharply angulated transitional segment in the left mid abdomen series 2 axial images  42-46 and on coronal reconstruction series 5 images 36-40 with likely etiology of adhesive disease, and collapse of the downstream small bowel segments. There is no appreciable small or large bowel pneumatosis. There is fluid in the ascending and transverse colon and new demonstration of a loop colostomy of the distal transverse colon through the left mid abdominal wall. 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 No overt thickening of the colon is seen. There is dense stool and barium in the descending colon, rectosigmoid surgical anastomosis. Vascular/Lymphatic: Aortic atherosclerosis. No enlarged abdominal or pelvic lymph nodes. Reproductive: No prostatomegaly. Other: No free fluid, free hemorrhage, free air or focal inflammatory process. There is no incarcerated hernia. Subcutaneous air in the anterior left lower abdomen is probably due to a recent injection. Musculoskeletal: There is streak artifact from again noted left hip arthroplasty. Osteopenia and degenerative changes of the spine. Sclerotic lesion left L4 pedicle and transverse process is chronically seen. IMPRESSION: 1. High-grade small-bowel obstruction with transition probably in the distal 1/3 of the ileum, with a sharply angulated transitional segment in the left mid abdomen, likely  due to adhesive disease. 2. Portal venous gas in the liver, with suspected gastric wall pneumatosis posteriorly. This can be seen with GI tract ischemia. No pneumatosis is seen in the bowel. 3. Fluid in the ascending and transverse colon but no overt thickening of the colon. 4. 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 5. Aortic and coronary artery atherosclerosis. 6. Cholelithiasis. 7. Stable bilateral adrenal nodules. 8. Stable sclerotic lesion left L4 pedicle and transverse process. 9. Subcutaneous air in the anterior left lower abdomen, probably due to a recent injection. 10. Critical Value/emergent results were called by telephone at the  time of interpretation on 06/22/2023 at 6:08 am to provider MARK QUALE , who verbally acknowledged these results. Aortic Atherosclerosis (ICD10-I70.0). Electronically Signed   By: Almira Bar M.D.   On: 06/22/2023 06:08   DG Chest Port 1 View  Result Date: 06/22/2023 CLINICAL DATA:  Abdominal pain EXAM: PORTABLE CHEST 1 VIEW COMPARISON:  07/01/2022 FINDINGS: Normal heart size and mediastinal contours. Chronic elevation of the right diaphragm. No acute infiltrate or edema. No effusion or pneumothorax. No acute osseous findings. IMPRESSION: No active disease. Electronically Signed   By: Tiburcio Pea M.D.   On: 06/22/2023 05:04      Time coordinating discharge: over 30 minutes  SIGNED:  Sunnie Nielsen DO [facility]

## 2023-06-27 LAB — CULTURE, BLOOD (ROUTINE X 2)
Culture: NO GROWTH
Culture: NO GROWTH
Special Requests: ADEQUATE
Special Requests: ADEQUATE

## 2023-07-09 ENCOUNTER — Telehealth: Payer: Self-pay

## 2023-07-09 NOTE — Telephone Encounter (Addendum)
Vikki Ports called in from transportation to get the time and date for the patient appointment. Informed her that the patient referral was fax back to [facility] because the patient go to [facility].

## 2023-07-15 ENCOUNTER — Emergency Department: Payer: Medicare Other

## 2023-07-15 ENCOUNTER — Inpatient Hospital Stay
Admission: EM | Admit: 2023-07-15 | Discharge: 2023-07-18 | DRG: 388 | Disposition: A | Discharge status: Other Acute Inpatient Hospital | Payer: Medicare Other | Source: Skilled Nursing Facility | Attending: Internal Medicine | Admitting: Internal Medicine

## 2023-07-15 ENCOUNTER — Other Ambulatory Visit: Payer: Self-pay

## 2023-07-15 DIAGNOSIS — N1831 Chronic kidney disease, stage 3a: Secondary | ICD-10-CM | POA: Diagnosis present

## 2023-07-15 DIAGNOSIS — G40101 Localization-related (focal) (partial) symptomatic epilepsy and epileptic syndromes with simple partial seizures, not intractable, with status epilepticus: Secondary | ICD-10-CM | POA: Diagnosis not present

## 2023-07-15 DIAGNOSIS — I5032 Chronic diastolic (congestive) heart failure: Secondary | ICD-10-CM | POA: Diagnosis present

## 2023-07-15 DIAGNOSIS — I4892 Unspecified atrial flutter: Secondary | ICD-10-CM | POA: Diagnosis not present

## 2023-07-15 DIAGNOSIS — N400 Enlarged prostate without lower urinary tract symptoms: Secondary | ICD-10-CM | POA: Diagnosis present

## 2023-07-15 DIAGNOSIS — Z85038 Personal history of other malignant neoplasm of large intestine: Secondary | ICD-10-CM

## 2023-07-15 DIAGNOSIS — I480 Paroxysmal atrial fibrillation: Secondary | ICD-10-CM | POA: Diagnosis present

## 2023-07-15 DIAGNOSIS — I2510 Atherosclerotic heart disease of native coronary artery without angina pectoris: Secondary | ICD-10-CM | POA: Diagnosis present

## 2023-07-15 DIAGNOSIS — N170 Acute kidney failure with tubular necrosis: Secondary | ICD-10-CM | POA: Diagnosis present

## 2023-07-15 DIAGNOSIS — Z8249 Family history of ischemic heart disease and other diseases of the circulatory system: Secondary | ICD-10-CM

## 2023-07-15 DIAGNOSIS — J449 Chronic obstructive pulmonary disease, unspecified: Secondary | ICD-10-CM | POA: Diagnosis present

## 2023-07-15 DIAGNOSIS — I130 Hypertensive heart and chronic kidney disease with heart failure and stage 1 through stage 4 chronic kidney disease, or unspecified chronic kidney disease: Secondary | ICD-10-CM | POA: Diagnosis present

## 2023-07-15 DIAGNOSIS — I959 Hypotension, unspecified: Secondary | ICD-10-CM | POA: Diagnosis present

## 2023-07-15 DIAGNOSIS — K567 Ileus, unspecified: Secondary | ICD-10-CM | POA: Diagnosis present

## 2023-07-15 DIAGNOSIS — R569 Unspecified convulsions: Secondary | ICD-10-CM

## 2023-07-15 DIAGNOSIS — G629 Polyneuropathy, unspecified: Secondary | ICD-10-CM | POA: Diagnosis present

## 2023-07-15 DIAGNOSIS — Z79899 Other long term (current) drug therapy: Secondary | ICD-10-CM

## 2023-07-15 DIAGNOSIS — I69354 Hemiplegia and 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 side: Secondary | ICD-10-CM

## 2023-07-15 DIAGNOSIS — F418 Other specified anxiety disorders: Secondary | ICD-10-CM | POA: Diagnosis present

## 2023-07-15 DIAGNOSIS — G8929 Other chronic pain: Secondary | ICD-10-CM | POA: Diagnosis present

## 2023-07-15 DIAGNOSIS — Z806 Family history of leukemia: Secondary | ICD-10-CM

## 2023-07-15 DIAGNOSIS — N179 Acute kidney failure, unspecified: Secondary | ICD-10-CM | POA: Diagnosis not present

## 2023-07-15 DIAGNOSIS — K56609 Unspecified intestinal obstruction, unspecified as to partial versus complete obstruction: Principal | ICD-10-CM | POA: Diagnosis present

## 2023-07-15 DIAGNOSIS — Z7982 Long term (current) use of aspirin: Secondary | ICD-10-CM

## 2023-07-15 DIAGNOSIS — G40909 Epilepsy, unspecified, not intractable, without status epilepticus: Secondary | ICD-10-CM

## 2023-07-15 DIAGNOSIS — Z515 Encounter for palliative care: Secondary | ICD-10-CM | POA: Diagnosis not present

## 2023-07-15 DIAGNOSIS — Z933 Colostomy status: Secondary | ICD-10-CM | POA: Diagnosis not present

## 2023-07-15 DIAGNOSIS — Z711 Person with feared health complaint in whom no diagnosis is made: Secondary | ICD-10-CM | POA: Diagnosis not present

## 2023-07-15 DIAGNOSIS — I10 Essential (primary) hypertension: Secondary | ICD-10-CM | POA: Diagnosis present

## 2023-07-15 DIAGNOSIS — R1084 Generalized abdominal pain: Secondary | ICD-10-CM | POA: Diagnosis present

## 2023-07-15 DIAGNOSIS — E860 Dehydration: Secondary | ICD-10-CM | POA: Diagnosis present

## 2023-07-15 DIAGNOSIS — E861 Hypovolemia: Secondary | ICD-10-CM | POA: Diagnosis present

## 2023-07-15 DIAGNOSIS — E785 Hyperlipidemia, unspecified: Secondary | ICD-10-CM | POA: Diagnosis present

## 2023-07-15 DIAGNOSIS — Z789 Other specified health status: Secondary | ICD-10-CM | POA: Diagnosis not present

## 2023-07-15 DIAGNOSIS — F17200 Nicotine dependence, unspecified, uncomplicated: Secondary | ICD-10-CM | POA: Diagnosis present

## 2023-07-15 DIAGNOSIS — Z7189 Other specified counseling: Secondary | ICD-10-CM | POA: Diagnosis not present

## 2023-07-15 DIAGNOSIS — M1990 Unspecified osteoarthritis, unspecified site: Secondary | ICD-10-CM | POA: Diagnosis present

## 2023-07-15 DIAGNOSIS — I252 Old myocardial infarction: Secondary | ICD-10-CM

## 2023-07-15 DIAGNOSIS — K5981 Ogilvie syndrome: Secondary | ICD-10-CM | POA: Diagnosis present

## 2023-07-15 DIAGNOSIS — F419 Anxiety disorder, unspecified: Secondary | ICD-10-CM | POA: Diagnosis present

## 2023-07-15 DIAGNOSIS — I9589 Other hypotension: Secondary | ICD-10-CM | POA: Diagnosis not present

## 2023-07-15 DIAGNOSIS — R519 Headache, unspecified: Secondary | ICD-10-CM | POA: Diagnosis present

## 2023-07-15 DIAGNOSIS — G40201 Localization-related (focal) (partial) symptomatic epilepsy and epileptic syndromes with complex partial seizures, not intractable, with status epilepticus: Secondary | ICD-10-CM | POA: Diagnosis not present

## 2023-07-15 LAB — LIPASE, BLOOD: Lipase: 37 U/L (ref 11–51)

## 2023-07-15 LAB — CBC WITH DIFFERENTIAL/PLATELET
Abs Immature Granulocytes: 0.03 10*3/uL (ref 0.00–0.07)
Basophils Absolute: 0 10*3/uL (ref 0.0–0.1)
Basophils Relative: 1 %
Eosinophils Absolute: 0.1 10*3/uL (ref 0.0–0.5)
Eosinophils Relative: 2 %
HCT: 52.6 % — ABNORMAL HIGH (ref 39.0–52.0)
Hemoglobin: 16.2 g/dL (ref 13.0–17.0)
Immature Granulocytes: 0 %
Lymphocytes Relative: 24 %
Lymphs Abs: 1.7 10*3/uL (ref 0.7–4.0)
MCH: 29 pg (ref 26.0–34.0)
MCHC: 30.8 g/dL (ref 30.0–36.0)
MCV: 94.3 fL (ref 80.0–100.0)
Monocytes Absolute: 0.4 10*3/uL (ref 0.1–1.0)
Monocytes Relative: 5 %
Neutro Abs: 4.9 10*3/uL (ref 1.7–7.7)
Neutrophils Relative %: 68 %
Platelets: 255 10*3/uL (ref 150–400)
RBC: 5.58 MIL/uL (ref 4.22–5.81)
RDW: 13.3 % (ref 11.5–15.5)
WBC: 7.2 10*3/uL (ref 4.0–10.5)
nRBC: 0 % (ref 0.0–0.2)

## 2023-07-15 LAB — COMPREHENSIVE METABOLIC PANEL
ALT: 11 U/L (ref 0–44)
AST: 16 U/L (ref 15–41)
Albumin: 5 g/dL (ref 3.5–5.0)
Alkaline Phosphatase: 75 U/L (ref 38–126)
Anion gap: 9 (ref 5–15)
BUN: 16 mg/dL (ref 8–23)
CO2: 21 mmol/L — ABNORMAL LOW (ref 22–32)
Calcium: 9.6 mg/dL (ref 8.9–10.3)
Chloride: 105 mmol/L (ref 98–111)
Creatinine, Ser: 1.84 mg/dL — ABNORMAL HIGH (ref 0.61–1.24)
GFR, Estimated: 38 mL/min — ABNORMAL LOW (ref >60)
Glucose, Bld: 121 mg/dL — ABNORMAL HIGH (ref 70–99)
Potassium: 4.2 mmol/L (ref 3.5–5.1)
Sodium: 135 mmol/L (ref 135–145)
Total Bilirubin: 0.6 mg/dL (ref <1.2)
Total Protein: 9.2 g/dL — ABNORMAL HIGH (ref 6.5–8.1)

## 2023-07-15 LAB — MAGNESIUM: Magnesium: 2.1 mg/dL (ref 1.7–2.4)

## 2023-07-15 LAB — SEDIMENTATION RATE: Sed Rate: 12 mm/h (ref 0–20)

## 2023-07-15 LAB — C-REACTIVE PROTEIN: CRP: 1.5 mg/dL — ABNORMAL HIGH (ref <1.0)

## 2023-07-15 MED ORDER — MORPHINE SULFATE (PF) 4 MG/ML IV SOLN
4.0000 mg | Freq: Once | INTRAVENOUS | Status: AC
  Administered 2023-07-15: 4 mg via INTRAVENOUS
  Filled 2023-07-15: qty 1

## 2023-07-15 MED ORDER — SODIUM CHLORIDE 0.9 % IV SOLN
750.0000 mg | Freq: Two times a day (BID) | INTRAVENOUS | Status: DC
  Administered 2023-07-15 – 2023-07-16 (×2): 750 mg via INTRAVENOUS
  Filled 2023-07-15 (×3): qty 7.5

## 2023-07-15 MED ORDER — LACTATED RINGERS IV BOLUS
1000.0000 mL | Freq: Once | INTRAVENOUS | Status: AC
  Administered 2023-07-15: 1000 mL via INTRAVENOUS

## 2023-07-15 MED ORDER — ACETAMINOPHEN 10 MG/ML IV SOLN
1000.0000 mg | Freq: Four times a day (QID) | INTRAVENOUS | Status: AC | PRN
  Administered 2023-07-15: 1000 mg via INTRAVENOUS
  Filled 2023-07-15 (×2): qty 100

## 2023-07-15 MED ORDER — LACTATED RINGERS IV BOLUS
1000.0000 mL | Freq: Once | INTRAVENOUS | Status: AC
Start: 2023-07-15 — End: 2023-07-15
  Administered 2023-07-15: 1000 mL via INTRAVENOUS

## 2023-07-15 MED ORDER — IOHEXOL 350 MG/ML SOLN
80.0000 mL | Freq: Once | INTRAVENOUS | Status: AC | PRN
  Administered 2023-07-15: 80 mL via INTRAVENOUS

## 2023-07-15 MED ORDER — ENOXAPARIN SODIUM 60 MG/0.6ML IJ SOSY
0.5000 mg/kg | PREFILLED_SYRINGE | INTRAMUSCULAR | Status: DC
  Administered 2023-07-15 – 2023-07-18 (×4): 45 mg via SUBCUTANEOUS
  Filled 2023-07-15 (×4): qty 0.6

## 2023-07-15 MED ORDER — MORPHINE SULFATE (PF) 2 MG/ML IV SOLN
2.0000 mg | INTRAVENOUS | Status: DC | PRN
  Administered 2023-07-15 – 2023-07-18 (×12): 2 mg via INTRAVENOUS
  Filled 2023-07-15 (×12): qty 1

## 2023-07-15 MED ORDER — LACTATED RINGERS IV SOLN: INTRAVENOUS | Status: AC

## 2023-07-15 MED ORDER — LACTATED RINGERS IV SOLN: INTRAVENOUS | Status: DC

## 2023-07-15 MED ORDER — TIOTROPIUM BROMIDE MONOHYDRATE 18 MCG IN CAPS
18.0000 ug | ORAL_CAPSULE | Freq: Every day | RESPIRATORY_TRACT | Status: DC
  Administered 2023-07-15 – 2023-07-18 (×3): 18 ug via RESPIRATORY_TRACT
  Filled 2023-07-15 (×3): qty 5

## 2023-07-15 MED ORDER — DIPHENHYDRAMINE HCL 50 MG/ML IJ SOLN
12.5000 mg | Freq: Once | INTRAMUSCULAR | Status: AC
  Administered 2023-07-15: 12.5 mg via INTRAVENOUS
  Filled 2023-07-15: qty 1

## 2023-07-15 MED ORDER — PROCHLORPERAZINE EDISYLATE 10 MG/2ML IJ SOLN: 10.0000 mg | Freq: Four times a day (QID) | INTRAMUSCULAR | Status: DC | PRN

## 2023-07-15 MED ORDER — METOCLOPRAMIDE HCL 5 MG/ML IJ SOLN
10.0000 mg | Freq: Once | INTRAMUSCULAR | Status: AC
  Administered 2023-07-15: 10 mg via INTRAVENOUS
  Filled 2023-07-15: qty 2

## 2023-07-15 MED ORDER — ACETAMINOPHEN 325 MG RE SUPP
650.0000 mg | Freq: Four times a day (QID) | RECTAL | Status: DC | PRN
Start: 2023-07-15 — End: 2023-07-15

## 2023-07-15 MED ORDER — ACETAMINOPHEN 325 MG PO TABS
650.0000 mg | ORAL_TABLET | Freq: Four times a day (QID) | ORAL | Status: DC | PRN
Start: 2023-07-15 — End: 2023-07-15

## 2023-07-15 NOTE — Assessment & Plan Note (Signed)
At baseline 

## 2023-07-15 NOTE — ED Notes (Signed)
Messaged Pharmacy to send [facility]

## 2023-07-15 NOTE — ED Notes (Signed)
IV's do not draw back. Called lab to draw new ordered labs.

## 2023-07-15 NOTE — Assessment & Plan Note (Signed)
Clinically euvolemic Hold blood pressure lowering meds in the setting of hypotension

## 2023-07-15 NOTE — ED Notes (Signed)
Lab tech at bedside to draw newly ordered labs.

## 2023-07-15 NOTE — H&P (Signed)
History and Physical    Patient: Calvin [name]:[id number] DOB: 07/05/1951 DOA: 07/15/2023 DOS: the patient was seen and examined on 07/15/2023 PCP: Lucita Ferrara, MD  Patient coming from: SNF  Chief Complaint:  Chief Complaint  Patient presents with   Abdominal Pain    HPI: Calvin [name] is a 72 y.o. male with medical history significant for Oglivie's syndrome, colon cancer s/p transverse colostomy, recurrent SBO,  COPD, CAD, HTN, stroke with mild left-sided weakness,  seizure disorder,  CKD IIIa, HFpEF,  admitted from 10/18 to 06/26/2023 with SBO managed conservatively who presents with sudden onset vomiting, abdominal pain and distention similar to his recent presentation for SBO.  He has no fever or chills.  BP with EMS ED course and data review: Afebrile, BP 71/48, improving to 112/67 with hydration.  Heart rate in the 60s. Labs CBC unremarkable CMP notable for creatinine of 4 up from baseline of 0.9 with bicarb of 21 Lipase and LFTs WNL UA pending CT abdomen and pelvis: Preliminary read - Consistent with SBO Patient treated with LR x 3, Reglan, morphine NG being placed in the ED Hospitalist consulted for admission.    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 side (HCC)    Hypertension    Myocardial infarction (HCC)    Neuromuscular disorder (HCC)    Ogilvie syndrome    Seizures (HCC)    last 6 months ago   Stroke [facility])    multiple  left side weakness   Tremors of nervous system    Unstable angina [facility])    Past Surgical History:  Procedure Laterality Date   CARPAL TUNNEL RELEASE Left 10/19/2015   Procedure: CARPAL TUNNEL RELEASE;  Surgeon: Kennedy Bucker, MD;  Location:  [facility] ORS;  Service: Orthopedics;  Laterality: Left;   COLON SURGERY     COLONOSCOPY WITH PROPOFOL N/A 10/26/2021   Procedure: COLONOSCOPY WITH PROPOFOL;  Surgeon: Regis Bill, MD;  Location: [facility] ENDOSCOPY;  Service: Endoscopy;  Laterality: N/A;   COLONOSCOPY WITH PROPOFOL N/A 06/02/2022   Procedure: COLONOSCOPY WITH PROPOFOL;  Surgeon: Regis Bill, MD;  Location: [facility] ENDOSCOPY;  Service: Endoscopy;  Laterality: N/A;   COLONOSCOPY WITH PROPOFOL N/A 06/06/2022   Procedure: COLONOSCOPY WITH PROPOFOL;  Surgeon: Regis Bill, MD;  Location: [facility] ENDOSCOPY;  Service: Endoscopy;  Laterality: N/A;   JOINT REPLACEMENT     left partial hip    THROAT SURGERY  2013   cancer   Social History:  reports that he has been smoking. He has never used smokeless tobacco. He reports current alcohol use. He reports that he does not use drugs.  No Known Allergies  Family History  Problem Relation Age of Onset   Cancer Mother    Hypertension Father    Leukemia Brother     Prior to Admission medications   Medication Sig Start Date End Date Taking? Authorizing Provider  acetaminophen (TYLENOL) 325 MG tablet Take 650 mg by mouth every 6 (six) hours.    [provider]  alum & mag hydroxide-simeth (MAALOX/MYLANTA) 200-200-20 MG/5ML suspension Take 30 mLs by mouth every 12 (twelve) hours as  needed for indigestion or heartburn.    [provider]  ASPERCREME ORIGINAL 10 % cream Apply 1 Application topically daily. 01/25/23   [provider]  aspirin 81 MG chewable tablet Take 81 mg by mouth daily.    [provider]  baclofen (LIORESAL) 5 mg TABS tablet Take 5 mg by mouth 2 (two) times daily.    [provider]  bisacodyl (DULCOLAX) 10 MG suppository Place 1 suppository (10 mg total) rectally daily as needed for moderate constipation or mild constipation. 06/26/23   Sunnie Nielsen, DO  bisacodyl (DULCOLAX) 5 MG EC tablet Take 5 mg by mouth daily.     [provider]  Calcium Carb-Cholecalciferol 600-10 MG-MCG TABS Take 1 tablet by mouth daily.    [provider]  CALMOSEPTINE 0.44-20.6 % OINT Apply 1 Application topically 3 (three) times daily. 04/10/23   [provider]  citalopram (CELEXA) 10 MG tablet Take 10 mg by mouth daily. 06/04/23   [provider]  fluticasone (FLONASE) 50 MCG/ACT nasal spray Place 2 sprays into both nostrils daily. 05/15/23   [provider]  furosemide (LASIX) 20 MG tablet Take 1 tablet (20 mg total) by mouth daily. 11/02/21   Wouk, Wilfred Curtis, MD  guaiFENesin (MUCINEX) 600 MG 12 hr tablet Take 600 mg by mouth 2 (two) times daily.    [provider]  HYDROcodone-acetaminophen (NORCO/VICODIN) 5-325 MG tablet Take 1 tablet by mouth every 6 (six) hours as needed for severe pain (pain score 7-10). 06/26/23   Sunnie Nielsen, DO  lactulose Calvin [name]) 10 GM/15ML solution Take 20 g by mouth daily.    [provider]  lamoTRIgine (LAMICTAL) 25 MG tablet Take 50 mg by mouth 2 (two) times daily.    [provider]  levETIRAcetam (KEPPRA) 750 MG tablet Take 750 mg by mouth 2 (two) times daily.    [provider]  lisinopril (ZESTRIL) 5 MG tablet Take 1 tablet (5 mg total) by mouth daily. 08/14/19   Dhungel, Theda Belfast, MD  loratadine (CLARITIN) 10 MG tablet Take 10 mg by mouth daily.    [provider]  Melatonin 10 MG TABS Take 10 mg by mouth at bedtime.    [provider]  metoprolol succinate (TOPROL-XL) 25 MG 24 hr tablet Take 25 mg by mouth daily.    [provider]  naloxone [facility]) nasal spray 4 mg/0.1 mL Place 1 spray into the nose once.    [provider]  nitroGLYCERIN (NITROSTAT) 0.4 MG SL tablet Place 0.4 mg under the tongue every 5 (five) minutes as needed for chest pain.    [provider]  nystatin (MYCOSTATIN/NYSTOP) powder Apply 1 Application topically 3 (three) times daily.     [provider]  Omega-3 1000 MG CAPS Take 1,000 mg by mouth daily.    [provider]  ondansetron (ZOFRAN-ODT) 4 MG disintegrating tablet Take 4 mg by mouth every 8 (eight) hours as needed for nausea or vomiting.    [provider]  pantoprazole (PROTONIX) 40 MG tablet Take 40 mg by mouth in the morning.    [provider]  polyethylene glycol (MIRALAX / GLYCOLAX) 17 g packet Take 34 g by mouth daily. 06/30/22   Lurene Shadow, MD  pregabalin (LYRICA) 50 MG capsule Take 50 mg capsule twice daily and 100 mg (two capsules) at bedtime 06/26/23   Sunnie Nielsen, DO  senna (SENOKOT) 8.6 MG TABS tablet Take 2 tablets (17.2 mg total) by mouth at bedtime as  needed for mild constipation or moderate constipation. 06/26/23   Sunnie Nielsen, DO  simvastatin (ZOCOR) 20 MG tablet Take 20 mg by mouth at bedtime.    [provider]  tamsulosin (FLOMAX) 0.4 MG CAPS capsule Take 0.4 mg by mouth every evening.    [provider]  tiotropium (SPIRIVA) 18 MCG inhalation capsule Place 18 mcg into inhaler and inhale daily.    [provider]  triamcinolone cream (KENALOG) 0.1 % Apply 1 Application topically 2 (two) times daily.    [provider]  Wheat Dextrin (BENEFIBER DRINK MIX PO) Take 5 g by mouth daily.    [provider]  Rayburn Ma Oil (ARTIFICIAL TEARS) 83-15 % OINT Place 1 Application into both eyes at bedtime.    [provider]    Physical Exam: Vitals:   07/15/23 0409 07/15/23 0412 07/15/23 0415 07/15/23 0430  BP: 93/60 (!) 96/59 99/67 112/67  Pulse: (!) 46  62 69  Resp: 14 12 16 11   Temp:      TempSrc:      SpO2:   98% 96%  Weight:      Height:       Physical Exam Vitals and nursing note reviewed.  Constitutional:      Appearance: He is ill-appearing.     Comments: Retching and vomiting  HENT:     Head: Normocephalic and atraumatic.  Cardiovascular:     Rate and Rhythm: Normal  rate and regular rhythm.     Heart sounds: Normal heart sounds.  Pulmonary:     Effort: Pulmonary effort is normal.     Breath sounds: Normal breath sounds.  Abdominal:     General: There is distension.     Palpations: Abdomen is soft.     Tenderness: There is generalized abdominal tenderness.     Comments: Colostomy left lower quadrant .  Colostomy bag with brown liquid stool  Neurological:     Mental Status: Mental status is at baseline.     Labs on Admission: I have personally reviewed following labs and imaging studies  CBC: Recent Labs  Lab 07/15/23 0327  WBC 7.2  NEUTROABS 4.9  HGB 16.2  HCT 52.6*  MCV 94.3  PLT 255   Basic Metabolic Panel: Recent Labs  Lab 07/15/23 0327  NA 135  K 4.2  CL 105  CO2 21*  GLUCOSE 121*  BUN 16  CREATININE 1.84*  CALCIUM 9.6  MG 2.1   GFR: Estimated Creatinine Clearance: 37 mL/min (A) (by C-G formula based on SCr of 1.84 mg/dL (H)). Liver Function Tests: Recent Labs  Lab 07/15/23 0327  AST 16  ALT 11  ALKPHOS 75  BILITOT 0.6  PROT 9.2*  ALBUMIN 5.0   Recent Labs  Lab 07/15/23 0327  LIPASE 37   No results for input(s): "AMMONIA" in the last 168 hours. Coagulation Profile: No results for input(s): "INR", "PROTIME" in the last 168 hours. Cardiac Enzymes: No results for input(s): "CKTOTAL", "CKMB", "CKMBINDEX", "TROPONINI" in the last 168 hours. BNP (last 3 results) No results for input(s): "PROBNP" in the last 8760 hours. HbA1C: No results for input(s): "HGBA1C" in the last 72 hours. CBG: No results for input(s): "GLUCAP" in the last 168 hours. Lipid Profile: No results for input(s): "CHOL", "HDL", "LDLCALC", "TRIG", "CHOLHDL", "LDLDIRECT" in the last 72 hours. Thyroid Function Tests: No results for input(s): "TSH", "T4TOTAL", "FREET4", "T3FREE", "THYROIDAB" in the last 72 hours. Anemia Panel: No results for input(s): "VITAMINB12", "FOLATE", "FERRITIN", "TIBC", "IRON", "RETICCTPCT"  in the last 72  hours. Urine analysis:    Component Value Date/Time   COLORURINE YELLOW 05/28/2022 1511   APPEARANCEUR CLEAR 05/28/2022 1511   APPEARANCEUR Clear 08/14/2014 0933   LABSPEC 1.015 05/28/2022 1511   LABSPEC 1.015 08/14/2014 0933   PHURINE 5.5 05/28/2022 1511   GLUCOSEU NEGATIVE 05/28/2022 1511   GLUCOSEU Negative 08/14/2014 0933   HGBUR TRACE (A) 05/28/2022 1511   BILIRUBINUR NEGATIVE 05/28/2022 1511   BILIRUBINUR Negative 08/14/2014 0933   KETONESUR NEGATIVE 05/28/2022 1511   PROTEINUR 100 (A) 05/28/2022 1511   UROBILINOGEN 0.2 06/12/2010 0103   NITRITE NEGATIVE 05/28/2022 1511   LEUKOCYTESUR TRACE (A) 05/28/2022 1511   LEUKOCYTESUR Negative 08/14/2014 0933    Radiological Exams on Admission: No results found.   Data Reviewed: Relevant notes from primary care and specialist visits, past discharge summaries as available in EHR, including Care Everywhere. Prior diagnostic testing as pertinent to current admission diagnoses Updated medications and problem lists for reconciliation ED course, including vitals, labs, imaging, treatment and response to treatment Triage notes, nursing and pharmacy notes and ED provider's notes Notable results as noted in HPI   Assessment and Plan: * SBO, recurrent (small bowel obstruction) (HCC) Ogilvie syndrome/History of colon cancer s/p transverse colostomy NG tube to low intermittent suction Symptom control antiemetics and pain meds IV fluid resuscitation  Hypotension Secondary to hypovolemia from vomiting Improved with IV fluid resuscitation in the ED Hold home antihypertensives Close monitoring  AKI (acute kidney injury) (HCC) Creatinine 1.84 up from baseline of 0.9 Secondary to ATN from renal hypoperfusion from hypotension as a result of vomiting Expecting improvement with IV fluid resuscitation Monitor renal function and avoid nephrotoxins  Chronic diastolic CHF (congestive heart failure) (HCC) Clinically euvolemic Hold blood  pressure lowering meds in the setting of hypotension  Seizure disorder (HCC) IV Keppra to replace oral Hold Lamictal given n.p.o. status  HTN (hypertension) Hold antihypertensives due to hypotension  CAD (coronary artery disease) No complaints of chest pain Will hold GDMT meds until able to tolerate  COPD (chronic obstructive pulmonary disease) (HCC) Not acutely exacerbated Continue home inhalers with DuoNebs as needed  Depression with anxiety Hold Celexa until able to tolerate orally  Hemiplegia and 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 side (HCC) No acute issues    DVT prophylaxis: Lovenox  Consults: surgery, Dr Aleen Campi  Advance Care Planning:   Code Status: Prior   Family Communication: none  Disposition Plan: Back to previous home environment  Severity of Illness: The appropriate patient status for this patient is INPATIENT. Inpatient status is judged to be reasonable and necessary in order to provide the required intensity of service to ensure the patient's safety. The patient's presenting symptoms, physical exam findings, and initial radiographic and laboratory data in the context of their chronic comorbidities is felt to place them at high risk for further clinical deterioration. Furthermore, it is not anticipated that the patient will be medically stable for discharge from the [name] within 2 midnights of admission.   * I certify that at the point of admission it is my clinical judgment that the patient will require inpatient [name] care spanning beyond 2 midnights from the point of admission due to high intensity of service, high risk for further deterioration and high frequency of surveillance required.*  Author: Andris Baumann, MD 07/15/2023 5:21 AM  For on call review [email or web address].

## 2023-07-15 NOTE — Assessment & Plan Note (Signed)
IV Keppra to replace oral Hold Lamictal given n.p.o. status

## 2023-07-15 NOTE — ED Provider Notes (Signed)
[facility] Provider Note    Event Date/Time   First MD Initiated Contact with Patient 07/15/23 [phone]     (approximate)   History   Abdominal Pain   HPI  Calvin [name] is a 72 y.o. male who presents to the ED for evaluation of Abdominal Pain   I reviewed medical DC summary from 10/22.  Admitted for 4 days for recurrent SBO.  History of Ogilvie syndrome.  Transverse colostomy in place.  He presents from a local SNF for evaluation of emesis, abdominal bloating and concern for recurrence of a bowel obstruction.  Reports it feels similar.   Physical Exam   Triage Vital Signs: ED Triage Vitals  Encounter Vitals Group     BP      Systolic BP Percentile      Diastolic BP Percentile      Pulse      Resp      Temp      Temp src      SpO2      Weight      Height      Head Circumference      Peak Flow      Pain Score      Pain Loc      Pain Education      Exclude from Growth Chart     Most recent vital signs: Vitals:   07/15/23 0540 07/15/23 0550  BP: (!) 85/63 90/66  Pulse: 62 76  Resp: 14 17  Temp:    SpO2: 98% 97%    General: Awake, no distress.  Seems uncomfortable CV:  Good peripheral perfusion.  Resp:  Normal effort.  Abd:  Ostomy with minimal thin liquid output.  Mildly distended abdomen no diffusely tender MSK:  No deformity noted.  Neuro:  No focal deficits appreciated. Other:     ED Results / Procedures / Treatments   Labs (all labs ordered are listed, but only abnormal results are displayed) Labs Reviewed  COMPREHENSIVE METABOLIC PANEL - Abnormal; Notable for the following components:      Result Value   CO2 21 (*)    Glucose, Bld 121 (*)    Creatinine, Ser 1.84 (*)    Total Protein 9.2 (*)    GFR, Estimated 38 (*)    All other components within normal limits  CBC WITH DIFFERENTIAL/PLATELET - Abnormal; Notable for the following components:   HCT 52.6 (*)    All other components within normal limits  LIPASE,  BLOOD  MAGNESIUM  URINALYSIS, ROUTINE W REFLEX MICROSCOPIC    EKG   RADIOLOGY CT abdomen/pelvis interpreted by me with signs of SBO  Official radiology report(s): CT ABDOMEN PELVIS W CONTRAST  Result Date: 07/15/2023 CLINICAL DATA:  Abdominal pain with nausea and vomiting. History of left lower quadrant stoma. EXAM: CT ABDOMEN AND PELVIS WITH CONTRAST TECHNIQUE: Multidetector CT imaging of the abdomen and pelvis was performed using the standard protocol following bolus administration of intravenous contrast. RADIATION DOSE REDUCTION: This exam was performed according to the departmental dose-optimization program which includes automated exposure control, adjustment of the mA and/or kV according to patient size and/or use of iterative reconstruction technique. CONTRAST:  80mL OMNIPAQUE IOHEXOL 350 MG/ML SOLN COMPARISON:  06/22/2023 FINDINGS: Lower chest: Dependent atelectasis noted in the lung bases, right greater than left and similar to prior. Hepatobiliary: No suspicious focal abnormality within the liver parenchyma. Layering tiny gallstones evident. No intrahepatic or extrahepatic biliary dilation. Pancreas: No focal mass lesion.  No dilatation of the main duct. No intraparenchymal cyst. No peripancreatic edema. Spleen: No splenomegaly. No suspicious focal mass lesion. Adrenals/Urinary Tract: Stable small bilateral adrenal nodules. These are stable since noncontrast CT stone study 09/30/2017 and demonstrated low attenuation on that study consistent with benign adenomas. No followup imaging is recommended. Cortical scarring noted in both kidneys without hydronephrosis. No evidence for hydroureter. The urinary bladder appears normal for the degree of distention. Stomach/Bowel: Stomach is moderately distended, filled with fluid and gas. Duodenum is normally positioned as is the ligament of Treitz. Small bowel loops are diffusely fluid-filled and distended up to about 3.7 cm diameter. Fluid-filled  distension extends distally to the level of the terminal ileum. The appendix is normal. Right colon is fluid-filled but un distended in transverse colon is nondistended. Left abdominal transverse loop colostomy evident with parastomal fat herniation similar to prior. Minimal diverticular disease is seen in the descending colon without diverticulitis. Vascular/Lymphatic: There is mild atherosclerotic calcification of the abdominal aorta without aneurysm. There is no gastrohepatic or hepatoduodenal ligament lymphadenopathy. No retroperitoneal or mesenteric lymphadenopathy. No pelvic sidewall lymphadenopathy. Reproductive: The prostate gland and seminal vesicles are unremarkable. Other: There is new small volume free fluid in the abdomen (see lateral left abdomen images 49-67 of series 2 and right abdomen on 43/2. Musculoskeletal: Status post left hip replacement. No worrisome lytic or sclerotic osseous abnormality. IMPRESSION: 1. Diffusely fluid-filled and distended small bowel loops up to about 3.7 cm diameter in the jejunal region. There is no discrete transition zone and the distal and terminal ileum nondistended but fluid-filled. Given the presence of new small volume free fluid in the abdomen, underlying component of small bowel obstruction suspected. No small bowel wall thickening or pneumatosis/portal venous gas. Right and transverse colon proximal to the loop colostomy is nondilated. 2. Left abdominal transverse loop colostomy with parastomal fat herniation similar to prior. 3. Cholelithiasis. 4.  Aortic Atherosclerosis (ICD10-I70.0). Electronically Signed   By: Kennith Center M.D.   On: 07/15/2023 05:32    PROCEDURES and INTERVENTIONS:  .1-3 Lead EKG Interpretation  Performed by: Delton Prairie, MD Authorized by: Delton Prairie, MD     Interpretation: normal     ECG rate:  74   ECG rate assessment: normal     Rhythm: sinus rhythm     Ectopy: none     Conduction: normal   .Critical Care  Performed  by: Delton Prairie, MD Authorized by: Delton Prairie, MD   Critical care provider statement:    Critical care time (minutes):  30   Critical care time was exclusive of:  Separately billable procedures and treating other patients   Critical care was necessary to treat or prevent imminent or life-threatening deterioration of the following conditions:  Circulatory failure   Critical care was time spent personally by me on the following activities:  Development of treatment plan with patient or surrogate, discussions with consultants, evaluation of patient's response to treatment, examination of patient, ordering and review of laboratory studies, ordering and review of radiographic studies, ordering and performing treatments and interventions, pulse oximetry, re-evaluation of patient's condition and review of old charts   Medications  tiotropium (SPIRIVA) inhalation capsule ([facility] use ONLY) 18 mcg (has no administration in time range)  enoxaparin (LOVENOX) injection 45 mg (has no administration in time range)  acetaminophen (TYLENOL) tablet 650 mg (has no administration in time range)    Or  acetaminophen (TYLENOL) suppository 650 mg (has no administration in time range)  lactated ringers infusion (has  no administration in time range)  morphine (PF) 2 MG/ML injection 2 mg (has no administration in time range)  prochlorperazine (COMPAZINE) injection 10 mg (has no administration in time range)  lactated ringers bolus 1,000 mL (0 mLs Intravenous Stopped 07/15/23 0413)  morphine (PF) 4 MG/ML injection 4 mg (4 mg Intravenous Given 07/15/23 0345)  metoCLOPramide (REGLAN) injection 10 mg (10 mg Intravenous Given 07/15/23 0347)  lactated ringers bolus 1,000 mL (0 mLs Intravenous Stopped 07/15/23 0415)  iohexol (OMNIPAQUE) 350 MG/ML injection 80 mL (80 mLs Intravenous Contrast Given 07/15/23 0445)  lactated ringers bolus 1,000 mL (1,000 mLs Intravenous New Bag/Given 07/15/23 0548)     IMPRESSION / MDM /  ASSESSMENT AND PLAN / ED COURSE  I reviewed the triage vital signs and the nursing notes.  Differential diagnosis includes, but is not limited to, SBO, colitis, perforation, cystitis, sepsis  {Patient presents with symptoms of an acute illness or injury that is potentially life-threatening.  Patient presents to the ED with evidence of recurrence of an SBO requiring medical admission.  Initially hypotensive requiring frequent reassessments and 3 L fluid resuscitation but this does resolve without indications for pressors.  Blood work with evidence of an AKI but normal CBC, lipase and electrolytes.  CT, as above.  Consult with medicine for admission.  Clinical Course as of 07/15/23 [id number]  Wynelle Link Jul 15, 2023  0401 Reassessed for a 3rd time since he arrived. BP has been low, he remains awake, GCS 15. Improving with IV fluids. Suspect hypovolemia with his emesis.  [DS]  0430 Reassessed. BP normalized [DS]    Clinical Course User Index [DS] Delton Prairie, MD     FINAL CLINICAL IMPRESSION(S) / ED DIAGNOSES   Final diagnoses:  Small bowel obstruction (HCC)  AKI (acute kidney injury) (HCC)     Rx / DC Orders   ED Discharge Orders     None        Note:  This document was prepared using Dragon voice recognition software and may include unintentional dictation errors.   Delton Prairie, MD 07/15/23 [phone]

## 2023-07-15 NOTE — Assessment & Plan Note (Signed)
Hold antihypertensives due to hypotension

## 2023-07-15 NOTE — Plan of Care (Signed)
  Problem: Clinical Measurements: Goal: Will remain free from infection Outcome: Progressing   Problem: Clinical Measurements: Goal: Diagnostic test results will improve Outcome: Progressing   Problem: Clinical Measurements: Goal: Respiratory complications will improve Outcome: Progressing   Problem: Clinical Measurements: Goal: Cardiovascular complication will be avoided Outcome: Progressing   

## 2023-07-15 NOTE — ED Notes (Signed)
This nurse offered to empty ostomy bag. Pt declined but stated he would make this nurse aware when he wanted it to be emptied.  TV remote given, call light within reach. NAD

## 2023-07-15 NOTE — Assessment & Plan Note (Signed)
No acute issues.

## 2023-07-15 NOTE — Assessment & Plan Note (Signed)
Ogilvie syndrome/History of colon cancer s/p transverse colostomy NG tube to low intermittent suction Symptom control antiemetics and pain meds IV fluid resuscitation

## 2023-07-15 NOTE — Assessment & Plan Note (Signed)
Hold Celexa until able to tolerate orally

## 2023-07-15 NOTE — Assessment & Plan Note (Signed)
Secondary to hypovolemia from vomiting Improved with IV fluid resuscitation in the ED Hold home antihypertensives Close monitoring

## 2023-07-15 NOTE — ED Notes (Signed)
Attending at bedside.

## 2023-07-15 NOTE — Progress Notes (Signed)
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  Surgery consulted.  Refused NG tube, conservative management for now.  Also with L temporal pain - vascular surgery consulted, ESR/CRP pending.  Assessment and Plan:  SBO, recurrent (small bowel obstruction) (HCC) Ogilvie syndrome/History of colon cancer s/p transverse colostomy NG tube declined by patient for now Symptom control antiemetics and pain meds IV fluid resuscitation Surgery is following, uncertain about SBO vs. Ileus Plan for repeat KUB in AM  L temporal pain Patient describes episodic stabbing pain in the L temporal area Urgent ESR/CRP ordered IV Tylenol for pain for now D/w Dr. Wallace Cullens, who is covering for vascular; he reports that >90% of temporal biopsies are negative and he would prefer to monitor clinically for the next 24 hours   Hypotension Secondary to hypovolemia from vomiting Resolved Hold home antihypertensives for now   AKI (acute kidney injury)  Creatinine 1.84 up from baseline of 0.9 Secondary to prerenal azotemia from dehydration associated with vomiting Continue IV fluid resuscitation Monitor renal function and avoid nephrotoxins   Chronic diastolic CHF (congestive heart failure)  Clinically euvolemic Hold blood pressure lowering meds in the setting of hypotension   Seizure disorder  IV Keppra to replace oral Hold Lamictal given n.p.o. status   HTN (hypertension) Hold antihypertensives due to hypotension   CAD (coronary artery disease) No complaints of chest pain Will hold GDMT meds until able to  tolerate   COPD (chronic obstructive pulmonary disease)  Not acutely exacerbated Continue home inhalers with DuoNebs as needed   Depression with anxiety Hold Celexa until able to tolerate orally   Hemiplegia and 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 side (HCC) No acute issues    Consultants: Surgery Vascular surgery  Procedures: None  Antibiotics: None  30 Day Unplanned Readmission Risk Score    Flowsheet Row ED to Hosp-Admission (Current) from 07/15/2023 in [facility] Emergency Department at [facility]  30 Day Unplanned Readmission Risk Score (%) 18.69 Filed at 07/15/2023 0801       This score is the patient's risk of an unplanned readmission within 30 days of being discharged (0 -100%). The score is based on dignosis, age, lab data, medications, orders, and past utilization.   Low:  0-14.9   Medium: 15-21.9   High: 22-29.9   Extreme: 30 and above            Subjective: Abdominal pain is some better, nausea improved.  Refused NG tube.  Primary current complaint is stabbing L temporal pain.  He reports some numbness along L lower face and has chronic L-sided numbness and weakness from prior CVA.  Physical Exam: Vitals:   07/15/23 0756 07/15/23 0830 07/15/23 1100 07/15/23 1130  BP:  113/72 108/74 115/72  Pulse:  65 61   Resp:  11 11   Temp: 97.8 F (36.6 C)     TempSrc: Oral     SpO2:  100% 98%   Weight:      Height:         Intake/Output Summary (Last 24 hours) at 07/15/2023 1318 Last data filed at 07/15/2023 1311  Gross per 24 hour  Intake 3667.2 ml  Output --  Net 3667.2 ml   Filed Weights   07/15/23 0352  Weight: 90 kg    Exam:  General:  Appears calm and comfortable and is in NAD, L temporal pain and TTP Eyes:  EOMI, normal lids, iris ENT:  grossly normal hearing, lips & tongue, mmm; edentulous Neck:  no LAD, masses or thyromegaly Cardiovascular:  RRR, no m/r/g. No LE edema.  Respiratory:   CTA  bilaterally with no wheezes/rales/rhonchi.  Normal respiratory effort. Abdomen: taut, distended, protrusion of ostomy due to distention but with liquid brown stool in ostomy bag Skin:  no rash or induration seen on limited exam Musculoskeletal:  grossly normal tone BUE/BLE, good ROM, no bony abnormality Psychiatric:  blunted mood and affect, speech fluent and appropriate, AOx3 Neurologic:  CN 2-12 grossly intact, moves all extremities in coordinated fashion  Data Reviewed: I have reviewed the patient's lab results since admission.  Pertinent labs for today include:   CO2 21 Glucose 121 BUN 16/Creatinine 1.84/GFR 38; 11/0.9/>60 on 10/22 Unremarkable CBC    Family Communication: None present; I was unable to reach his daughter by telephone and left a message  Disposition: Status is: Inpatient Remains inpatient appropriate because: ongoing evaluation and treatment  Planned Discharge Destination:  TBD    Time spent: 50 minutes  Author: Jonah Blue, MD 07/15/2023 1:18 PM  For on call review [email or web address].

## 2023-07-15 NOTE — ED Notes (Addendum)
Pt cx of left sided temple pain.  Pt stated he felt this pain yesterday. MD at bedside and aware

## 2023-07-15 NOTE — Progress Notes (Signed)
Anticoagulation monitoring(Lovenox):  72 yo male ordered Lovenox 40 mg Q24h    Filed Weights   07/15/23 0352  Weight: 90 kg (198 lb 6.6 oz)   BMI 32   Lab Results  Component Value Date   CREATININE 1.84 (H) 07/15/2023   CREATININE 0.90 06/26/2023   CREATININE 1.21 06/23/2023   Estimated Creatinine Clearance: 37 mL/min (A) (by C-G formula based on SCr of 1.84 mg/dL (H)). Hemoglobin & Hematocrit     Component Value Date/Time   HGB 16.2 07/15/2023 0327   HGB 12.9 (L) 08/14/2014 0933   HCT 52.6 (H) 07/15/2023 0327   HCT 39.7 (L) 08/14/2014 2440     Per Protocol for Patient with estCrcl > 30 ml/min and BMI > 30, will transition to Lovenox 45 mg Q24h.

## 2023-07-15 NOTE — ED Notes (Signed)
Pt called on bell complaining of HA to L temple. Informed provider Yates.

## 2023-07-15 NOTE — ED Notes (Signed)
Pt was transported to and from CT accompanied with this RN on the tele monitor. Tolerated well. VSS.

## 2023-07-15 NOTE — Consult Note (Signed)
Date of Consultation:  07/15/2023  Requesting Physician:  Lindajo Royal, MD  Reason for Consultation:  Small bowel obstruction  History of Present Illness: Calvin [name] is a 72 y.o. male presenting last night to emergency room with abdominal distention and vomiting.  The patient was recently admitted on 06/22/2023 for the same which was treated conservatively.  The patient has a history significant for Ogilvie's syndrome and has required a transverse colostomy which was done last year by Dr. Everlene Farrier.  Overall the patient reports that his symptoms felt similar to the last admission.  Reports abdominal distention and tightness.  Denies any worsening pain.  He has still been having ostomy function and there is liquid stool in his ostomy appliance at this point.  He had a CT scan of the abdomen pelvis on admission overnight which showed diffusely fluid-filled and distended small bowel loops.  Personally viewed the images, and there is no specific transition point.  The colon is nondistended but has fluid also leading all the way up to his transverse colostomy.  The patient was admitted to the medical team.  Although NG tube was recommended and ordered, the patient refused placement of this on admission.  Past Medical History: 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 side (HCC)    Hypertension    Myocardial infarction (HCC)    Neuromuscular disorder (HCC)    Ogilvie syndrome    Seizures (HCC)    last 6 months ago   Stroke [facility])    multiple  left side weakness   Tremors of nervous system    Unstable angina (HCC)      Past Surgical History: Past Surgical History:  Procedure Laterality Date   CARPAL  TUNNEL RELEASE Left 10/19/2015   Procedure: CARPAL TUNNEL RELEASE;  Surgeon: Kennedy Bucker, MD;  Location: [facility] ORS;  Service: Orthopedics;  Laterality: Left;   COLON SURGERY     COLONOSCOPY WITH PROPOFOL N/A 10/26/2021   Procedure: COLONOSCOPY WITH PROPOFOL;  Surgeon: Regis Bill, MD;  Location: [facility] ENDOSCOPY;  Service: Endoscopy;  Laterality: N/A;   COLONOSCOPY WITH PROPOFOL N/A 06/02/2022   Procedure: COLONOSCOPY WITH PROPOFOL;  Surgeon: Regis Bill, MD;  Location: [facility] ENDOSCOPY;  Service: Endoscopy;  Laterality: N/A;   COLONOSCOPY WITH PROPOFOL N/A 06/06/2022   Procedure: COLONOSCOPY WITH PROPOFOL;  Surgeon: Regis Bill, MD;  Location: [facility] ENDOSCOPY;  Service: Endoscopy;  Laterality: N/A;   JOINT REPLACEMENT     left partial hip    THROAT SURGERY  2013   cancer    Home Medications: Prior to Admission medications   Medication Sig Start Date End Date Taking? Authorizing Provider  acetaminophen (TYLENOL) 325 MG tablet Take 650 mg by mouth every 6 (six) hours.   Yes [provider]  alum & mag hydroxide-simeth (MAALOX/MYLANTA) 200-200-20 MG/5ML suspension Take 30 mLs by mouth every 12 (twelve) hours as needed for indigestion or heartburn.   Yes [provider]  aspirin 81 MG chewable tablet Take 81 mg by mouth daily.   Yes [provider]  baclofen (LIORESAL) 5 mg TABS tablet Take 5 mg by mouth 2 (two) times  daily.   Yes [provider]  bisacodyl (DULCOLAX) 5 MG EC tablet Take 5 mg by mouth daily.   Yes [provider]  citalopram (CELEXA) 10 MG tablet Take 10 mg by mouth daily. 06/04/23  Yes [provider]  furosemide (LASIX) 20 MG tablet Take 1 tablet (20 mg total) by mouth daily. 11/02/21  Yes Wouk, Wilfred Curtis, MD  lamoTRIgine (LAMICTAL) 25 MG tablet Take 50 mg by mouth 2 (two) times daily.   Yes [provider]  levETIRAcetam (KEPPRA) 750 MG tablet Take 750 mg by mouth 2 (two) times daily.   Yes  [provider]  lisinopril (ZESTRIL) 5 MG tablet Take 1 tablet (5 mg total) by mouth daily. 08/14/19  Yes Dhungel, Nishant, MD  loratadine (CLARITIN) 10 MG tablet Take 10 mg by mouth daily.   Yes [provider]  LYRICA 100 MG capsule Take 1 mg by mouth at bedtime. 07/11/23  Yes [provider]  Melatonin 10 MG TABS Take 10 mg by mouth at bedtime.   Yes [provider]  metoprolol succinate (TOPROL-XL) 25 MG 24 hr tablet Take 25 mg by mouth daily.   Yes [provider]  pantoprazole (PROTONIX) 40 MG tablet Take 40 mg by mouth in the morning.   Yes [provider]  polyethylene glycol (MIRALAX / GLYCOLAX) 17 g packet Take 34 g by mouth daily. 06/30/22  Yes Lurene Shadow, MD  pregabalin (LYRICA) 50 MG capsule Take 50 mg capsule twice daily and 100 mg (two capsules) at bedtime 06/26/23  Yes Sunnie Nielsen, DO  senna (SENOKOT) 8.6 MG TABS tablet Take 2 tablets (17.2 mg total) by mouth at bedtime as needed for mild constipation or moderate constipation. 06/26/23  Yes Sunnie Nielsen, DO  simvastatin (ZOCOR) 20 MG tablet Take 20 mg by mouth at bedtime.   Yes [provider]  tamsulosin (FLOMAX) 0.4 MG CAPS capsule Take 0.4 mg by mouth every evening.   Yes [provider]  tiotropium (SPIRIVA) 18 MCG inhalation capsule Place 18 mcg into inhaler and inhale daily.   Yes [provider]  Wheat Dextrin (BENEFIBER DRINK MIX PO) Take 5 g by mouth daily.   Yes [provider]  ASPERCREME ORIGINAL 10 % cream Apply 1 Application topically daily. Patient not taking: Reported on 07/15/2023 01/25/23   [provider]  bisacodyl (DULCOLAX) 10 MG suppository Place 1 suppository (10 mg total) rectally daily as needed for moderate constipation or mild constipation. 06/26/23   Sunnie Nielsen, DO  Calcium Carb-Cholecalciferol 600-10 MG-MCG TABS Take 1 tablet by mouth daily. Patient not taking: Reported on  07/15/2023    [provider]  CALMOSEPTINE 0.44-20.6 % OINT Apply 1 Application topically 3 (three) times daily. Patient not taking: Reported on 07/15/2023 04/10/23   [provider]  fluticasone (FLONASE) 50 MCG/ACT nasal spray Place 2 sprays into both nostrils daily. 05/15/23   [provider]  guaiFENesin (MUCINEX) 600 MG 12 hr tablet Take 600 mg by mouth 2 (two) times daily. Patient not taking: Reported on 07/15/2023    [provider]  HYDROcodone-acetaminophen (NORCO/VICODIN) 5-325 MG tablet Take 1 tablet by mouth every 6 (six) hours as needed for severe pain (pain score 7-10). Patient not taking: Reported on 07/15/2023 06/26/23   Sunnie Nielsen, DO  lactulose [facility]) 10 GM/15ML solution Take 20 g by mouth daily. Patient not taking: Reported on 07/15/2023    [provider]  naloxone [facility]) nasal spray 4 mg/0.1 mL Place 1  spray into the nose once.    [provider]  nitroGLYCERIN (NITROSTAT) 0.4 MG SL tablet Place 0.4 mg under the tongue every 5 (five) minutes as needed for chest pain.    [provider]  nystatin (MYCOSTATIN/NYSTOP) powder Apply 1 Application topically 3 (three) times daily. Patient not taking: Reported on 07/15/2023    [provider]  Omega-3 1000 MG CAPS Take 1,000 mg by mouth daily. Patient not taking: Reported on 07/15/2023    [provider]  ondansetron (ZOFRAN-ODT) 4 MG disintegrating tablet Take 4 mg by mouth every 8 (eight) hours as needed for nausea or vomiting.    [provider]  triamcinolone cream (KENALOG) 0.1 % Apply 1 Application topically 2 (two) times daily. Patient not taking: Reported on 07/15/2023    [provider]  White Petrolatum-Mineral Oil (ARTIFICIAL TEARS) 83-15 % OINT Place 1 Application into both eyes at bedtime. Patient not taking: Reported on 07/15/2023    [provider]    Allergies: No Known Allergies  Social  History:  reports that he has been smoking. He has never used smokeless tobacco. He reports current alcohol use. He reports that he does not use drugs.   Family History: Family History  Problem Relation Age of Onset   Cancer Mother    Hypertension Father    Leukemia Brother     Review of Systems: Review of Systems  Constitutional:  Negative for chills and fever.  Respiratory:  Negative for shortness of breath.   Cardiovascular:  Negative for chest pain.  Gastrointestinal:  Positive for abdominal pain, nausea and vomiting.  Genitourinary:  Negative for dysuria.  Musculoskeletal:  Negative for myalgias.    Physical Exam BP 115/72   Pulse 61   Temp 97.8 F (36.6 C) (Oral)   Resp 11   Ht 5\' 6"  (1.676 m)   Wt 90 kg   SpO2 98%   BMI 32.02 kg/m  CONSTITUTIONAL: No acute distress HEENT:  Normocephalic, atraumatic, extraocular motion intact. NECK: Trachea is midline, and there is no jugular venous distension. RESPIRATORY:  Normal respiratory effort without pathologic use of accessory muscles. CARDIOVASCULAR: Regular rhythm and rate. GI: The abdomen is soft, distended, with no significant tenderness throughout the abdomen.  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  NEUROLOGIC:  Motor and sensation is grossly normal.  Cranial nerves are grossly intact. PSYCH:  Alert and oriented to person, place and time. Affect is normal.  Laboratory Analysis: Results for orders placed or performed during the hospital encounter of 07/15/23 (from the past 24 hour(s))  Lipase, blood     Status: None   Collection Time: 07/15/23  3:27 AM  Result Value Ref Range   Lipase 37 11 - 51 U/L  Magnesium     Status: None   Collection Time: 07/15/23  3:27 AM  Result Value Ref Range   Magnesium 2.1 1.7 - 2.4 mg/dL  Comprehensive metabolic panel     Status: Abnormal   Collection Time: 07/15/23  3:27 AM  Result Value Ref Range   Sodium 135 135 - 145 mmol/L    Potassium 4.2 3.5 - 5.1 mmol/L   Chloride 105 98 - 111 mmol/L   CO2 21 (L) 22 - 32 mmol/L   Glucose, Bld 121 (H) 70 - 99 mg/dL   BUN 16 8 - 23 mg/dL   Creatinine, Ser 2.70 (H) 0.61 - 1.24 mg/dL   Calcium 9.6 8.9 - 35.0 mg/dL  Total Protein 9.2 (H) 6.5 - 8.1 g/dL   Albumin 5.0 3.5 - 5.0 g/dL   AST 16 15 - 41 U/L   ALT 11 0 - 44 U/L   Alkaline Phosphatase 75 38 - 126 U/L   Total Bilirubin 0.6 <1.2 mg/dL   GFR, Estimated 38 (L) >60 mL/min   Anion gap 9 5 - 15  CBC with Differential/Platelet     Status: Abnormal   Collection Time: 07/15/23  3:27 AM  Result Value Ref Range   WBC 7.2 4.0 - 10.5 K/uL   RBC 5.58 4.22 - 5.81 MIL/uL   Hemoglobin 16.2 13.0 - 17.0 g/dL   HCT 40.9 (H) 81.1 - 91.4 %   MCV 94.3 80.0 - 100.0 fL   MCH 29.0 26.0 - 34.0 pg   MCHC 30.8 30.0 - 36.0 g/dL   RDW 78.2 95.6 - 21.3 %   Platelets 255 150 - 400 K/uL   nRBC 0.0 0.0 - 0.2 %   Neutrophils Relative % 68 %   Neutro Abs 4.9 1.7 - 7.7 K/uL   Lymphocytes Relative 24 %   Lymphs Abs 1.7 0.7 - 4.0 K/uL   Monocytes Relative 5 %   Monocytes Absolute 0.4 0.1 - 1.0 K/uL   Eosinophils Relative 2 %   Eosinophils Absolute 0.1 0.0 - 0.5 K/uL   Basophils Relative 1 %   Basophils Absolute 0.0 0.0 - 0.1 K/uL   Immature Granulocytes 0 %   Abs Immature Granulocytes 0.03 0.00 - 0.07 K/uL    Imaging: CT ABDOMEN PELVIS W CONTRAST  Result Date: 07/15/2023 CLINICAL DATA:  Abdominal pain with nausea and vomiting. History of left lower quadrant stoma. EXAM: CT ABDOMEN AND PELVIS WITH CONTRAST TECHNIQUE: Multidetector CT imaging of the abdomen and pelvis was performed using the standard protocol following bolus administration of intravenous contrast. RADIATION DOSE REDUCTION: This exam was performed according to the departmental dose-optimization program which includes automated exposure control, adjustment of the mA and/or kV according to patient size and/or use of iterative reconstruction technique. CONTRAST:  80mL OMNIPAQUE  IOHEXOL 350 MG/ML SOLN COMPARISON:  06/22/2023 FINDINGS: Lower chest: Dependent atelectasis noted in the lung bases, right greater than left and similar to prior. Hepatobiliary: No suspicious focal abnormality within the liver parenchyma. Layering tiny gallstones evident. No intrahepatic or extrahepatic biliary dilation. Pancreas: No focal mass lesion. No dilatation of the main duct. No intraparenchymal cyst. No peripancreatic edema. Spleen: No splenomegaly. No suspicious focal mass lesion. Adrenals/Urinary Tract: Stable small bilateral adrenal nodules. These are stable since noncontrast CT stone study 09/30/2017 and demonstrated low attenuation on that study consistent with benign adenomas. No followup imaging is recommended. Cortical scarring noted in both kidneys without hydronephrosis. No evidence for hydroureter. The urinary bladder appears normal for the degree of distention. Stomach/Bowel: Stomach is moderately distended, filled with fluid and gas. Duodenum is normally positioned as is the ligament of Treitz. Small bowel loops are diffusely fluid-filled and distended up to about 3.7 cm diameter. Fluid-filled distension extends distally to the level of the terminal ileum. The appendix is normal. Right colon is fluid-filled but un distended in transverse colon is nondistended. Left abdominal transverse loop colostomy evident with parastomal fat herniation similar to prior. Minimal diverticular disease is seen in the descending colon without diverticulitis. Vascular/Lymphatic: There is mild atherosclerotic calcification of the abdominal aorta without aneurysm. There is no gastrohepatic or hepatoduodenal ligament lymphadenopathy. No retroperitoneal or mesenteric lymphadenopathy. No pelvic sidewall lymphadenopathy. Reproductive: The prostate gland and seminal vesicles are  unremarkable. Other: There is new small volume free fluid in the abdomen (see lateral left abdomen images 49-67 of series 2 and right abdomen on  43/2. Musculoskeletal: Status post left hip replacement. No worrisome lytic or sclerotic osseous abnormality. IMPRESSION: 1. Diffusely fluid-filled and distended small bowel loops up to about 3.7 cm diameter in the jejunal region. There is no discrete transition zone and the distal and terminal ileum nondistended but fluid-filled. Given the presence of new small volume free fluid in the abdomen, underlying component of small bowel obstruction suspected. No small bowel wall thickening or pneumatosis/portal venous gas. Right and transverse colon proximal to the loop colostomy is nondilated. 2. Left abdominal transverse loop colostomy with parastomal fat herniation similar to prior. 3. Cholelithiasis. 4.  Aortic Atherosclerosis (ICD10-I70.0). Electronically Signed   By: Kennith Center M.D.   On: 07/15/2023 05:32    Assessment and Plan: This is a 72 y.o. male with a history of Ogilvie syndrome and transverse loop colostomy, presenting with recurrent small bowel obstruction.  - Based on imaging, it is unclear whether he truly would have a small bowel obstruction versus possibly partial versus possibly ileus versus possibly gastroenteritis.  He continues having ostomy function with liquid stool and his bowels are fluid-filled without a true transition point.  However he is distended. - Patient currently is not nauseous he had refused NG tube placement earlier today.  For now, I discussed with him that we can defer NG tube placement but if he gets more nauseous or has further emesis, then we will strongly recommend placement of an NG tube to help decompress the intestines. - For now, continue n.p.o. with IV fluid hydration. - Will order repeat KUB for tomorrow.  Will continue following with you.  I spent 45 minutes dedicated to the care of this patient on the date of this encounter to include pre-visit review of records, face-to-face time with the patient discussing diagnosis and management, and any post-visit  coordination of care.   Howie Ill, MD [facility] Pg:  [phone]

## 2023-07-15 NOTE — ED Triage Notes (Signed)
Pt brought in by EMS from [facility] - pt complaining of abdominal pain, nausea, vomiting that started tonight. Pt has a LLQ stoma that appears to be protruding further than normal - some liquid output in the bag with intestinal particles. Pt hypotensive for EMS 81/43. Pt hypotensive on arrival 79/58. Pt a/ox4, appears in pain.

## 2023-07-15 NOTE — ED Notes (Signed)
Called pharmacy to tube spiriva

## 2023-07-15 NOTE — ED Notes (Signed)
Pulse ox readjusted and replaced. O2 sat 95%

## 2023-07-15 NOTE — Assessment & Plan Note (Signed)
Not acutely exacerbated ?Continue home inhalers with DuoNebs as needed ?

## 2023-07-15 NOTE — Assessment & Plan Note (Signed)
Creatinine 1.84 up from baseline of 0.9 Secondary to ATN from renal hypoperfusion from hypotension as a result of vomiting Expecting improvement with IV fluid resuscitation Monitor renal function and avoid nephrotoxins

## 2023-07-15 NOTE — ED Notes (Signed)
This CNA changed pt's soiled brief, and new brief applied. Pt made this CNA aware that he still has pain in his left temple. Pt rated pain as a 9 on 0-10 scale. RN made aware. Pt has no other needs at this time.

## 2023-07-15 NOTE — Assessment & Plan Note (Signed)
No complaints of chest pain Will hold GDMT meds until able to tolerate

## 2023-07-16 ENCOUNTER — Inpatient Hospital Stay: Payer: Medicare Other

## 2023-07-16 DIAGNOSIS — K56609 Unspecified intestinal obstruction, unspecified as to partial versus complete obstruction: Secondary | ICD-10-CM | POA: Diagnosis not present

## 2023-07-16 LAB — CBC WITH DIFFERENTIAL/PLATELET
Abs Immature Granulocytes: 0.03 10*3/uL (ref 0.00–0.07)
Basophils Absolute: 0 10*3/uL (ref 0.0–0.1)
Basophils Relative: 0 %
Eosinophils Absolute: 0.2 10*3/uL (ref 0.0–0.5)
Eosinophils Relative: 3 %
HCT: 41.1 % (ref 39.0–52.0)
Hemoglobin: 13 g/dL (ref 13.0–17.0)
Immature Granulocytes: 1 %
Lymphocytes Relative: 24 %
Lymphs Abs: 1.1 10*3/uL (ref 0.7–4.0)
MCH: 29.3 pg (ref 26.0–34.0)
MCHC: 31.6 g/dL (ref 30.0–36.0)
MCV: 92.8 fL (ref 80.0–100.0)
Monocytes Absolute: 0.4 10*3/uL (ref 0.1–1.0)
Monocytes Relative: 8 %
Neutro Abs: 3 10*3/uL (ref 1.7–7.7)
Neutrophils Relative %: 64 %
Platelets: 129 10*3/uL — ABNORMAL LOW (ref 150–400)
RBC: 4.43 MIL/uL (ref 4.22–5.81)
RDW: 13.2 % (ref 11.5–15.5)
WBC: 4.7 10*3/uL (ref 4.0–10.5)
nRBC: 0 % (ref 0.0–0.2)

## 2023-07-16 LAB — BASIC METABOLIC PANEL
Anion gap: 11 (ref 5–15)
BUN: 10 mg/dL (ref 8–23)
CO2: 21 mmol/L — ABNORMAL LOW (ref 22–32)
Calcium: 8.7 mg/dL — ABNORMAL LOW (ref 8.9–10.3)
Chloride: 107 mmol/L (ref 98–111)
Creatinine, Ser: 1.08 mg/dL (ref 0.61–1.24)
GFR, Estimated: >60 mL/min (ref >60)
Glucose, Bld: 64 mg/dL — ABNORMAL LOW (ref 70–99)
Potassium: 4.5 mmol/L (ref 3.5–5.1)
Sodium: 139 mmol/L (ref 135–145)

## 2023-07-16 LAB — URINALYSIS, ROUTINE W REFLEX MICROSCOPIC
Bacteria, UA: NONE SEEN
Bilirubin Urine: NEGATIVE
Glucose, UA: NEGATIVE mg/dL
Hgb urine dipstick: NEGATIVE
Ketones, ur: NEGATIVE mg/dL
Nitrite: NEGATIVE
Protein, ur: NEGATIVE mg/dL
Specific Gravity, Urine: 1.019 (ref 1.005–1.030)
Squamous Epithelial / HPF: 0 /[HPF] (ref 0–5)
pH: 5 (ref 5.0–8.0)

## 2023-07-16 MED ORDER — LAMOTRIGINE 25 MG PO TABS: 50.0000 mg | ORAL_TABLET | Freq: Two times a day (BID) | ORAL | Status: DC

## 2023-07-16 MED ORDER — LISINOPRIL 5 MG PO TABS
5.0000 mg | ORAL_TABLET | Freq: Every day | ORAL | Status: DC
  Filled 2023-07-16: qty 1

## 2023-07-16 MED ORDER — BACLOFEN 10 MG PO TABS
5.0000 mg | ORAL_TABLET | Freq: Two times a day (BID) | ORAL | Status: DC
  Administered 2023-07-16: 5 mg via ORAL
  Filled 2023-07-16: qty 0.5
  Filled 2023-07-16 (×2): qty 1
  Filled 2023-07-16: qty 0.5

## 2023-07-16 MED ORDER — METOPROLOL SUCCINATE ER 50 MG PO TB24
25.0000 mg | ORAL_TABLET | Freq: Every day | ORAL | Status: DC
  Administered 2023-07-16: 25 mg via ORAL
  Filled 2023-07-16 (×2): qty 1

## 2023-07-16 MED ORDER — SENNA 8.6 MG PO TABS: 2.0000 | ORAL_TABLET | Freq: Every evening | ORAL | Status: DC | PRN

## 2023-07-16 MED ORDER — SIMVASTATIN 20 MG PO TABS
20.0000 mg | ORAL_TABLET | Freq: Every day | ORAL | Status: DC
  Administered 2023-07-16: 20 mg via ORAL
  Filled 2023-07-16 (×2): qty 1

## 2023-07-16 MED ORDER — MELATONIN 5 MG PO TABS
10.0000 mg | ORAL_TABLET | Freq: Every day | ORAL | Status: DC
  Administered 2023-07-16: 10 mg via ORAL
  Filled 2023-07-16: qty 2

## 2023-07-16 MED ORDER — LORATADINE 10 MG PO TABS
10.0000 mg | ORAL_TABLET | Freq: Every day | ORAL | Status: DC
  Administered 2023-07-16: 10 mg via ORAL
  Filled 2023-07-16 (×2): qty 1

## 2023-07-16 MED ORDER — POLYETHYLENE GLYCOL 3350 17 G PO PACK
34.0000 g | PACK | Freq: Every day | ORAL | Status: DC
  Administered 2023-07-16: 34 g via ORAL
  Filled 2023-07-16 (×2): qty 2

## 2023-07-16 MED ORDER — ONDANSETRON 4 MG PO TBDP
4.0000 mg | ORAL_TABLET | Freq: Three times a day (TID) | ORAL | Status: DC | PRN
  Administered 2023-07-16: 4 mg via ORAL
  Filled 2023-07-16: qty 1

## 2023-07-16 MED ORDER — BISACODYL 10 MG RE SUPP: 10.0000 mg | Freq: Every day | RECTAL | Status: DC | PRN

## 2023-07-16 MED ORDER — FLUTICASONE PROPIONATE 50 MCG/ACT NA SUSP
2.0000 | Freq: Every day | NASAL | Status: DC
  Administered 2023-07-17 – 2023-07-18 (×2): 2 via NASAL
  Filled 2023-07-16: qty 16

## 2023-07-16 MED ORDER — ASPIRIN 81 MG PO CHEW
81.0000 mg | CHEWABLE_TABLET | Freq: Every day | ORAL | Status: DC
  Filled 2023-07-16: qty 1

## 2023-07-16 MED ORDER — BISACODYL 5 MG PO TBEC: 5.0000 mg | DELAYED_RELEASE_TABLET | Freq: Every day | ORAL | Status: DC | PRN

## 2023-07-16 MED ORDER — TAMSULOSIN HCL 0.4 MG PO CAPS
0.4000 mg | ORAL_CAPSULE | Freq: Every evening | ORAL | Status: DC
  Administered 2023-07-16: 0.4 mg via ORAL
  Filled 2023-07-16: qty 1

## 2023-07-16 MED ORDER — PANTOPRAZOLE SODIUM 40 MG PO TBEC
40.0000 mg | DELAYED_RELEASE_TABLET | Freq: Every morning | ORAL | Status: DC
  Filled 2023-07-16: qty 1

## 2023-07-16 MED ORDER — PREGABALIN 50 MG PO CAPS
100.0000 mg | ORAL_CAPSULE | Freq: Every day | ORAL | Status: DC
  Administered 2023-07-16: 100 mg via ORAL
  Filled 2023-07-16: qty 2

## 2023-07-16 MED ORDER — ALUM & MAG HYDROXIDE-SIMETH 200-200-20 MG/5ML PO SUSP: 30.0000 mL | Freq: Two times a day (BID) | ORAL | Status: DC | PRN

## 2023-07-16 MED ORDER — PREGABALIN 50 MG PO CAPS
50.0000 mg | ORAL_CAPSULE | Freq: Two times a day (BID) | ORAL | Status: DC
  Administered 2023-07-16: 50 mg via ORAL
  Filled 2023-07-16 (×3): qty 1

## 2023-07-16 MED ORDER — NITROGLYCERIN 0.4 MG SL SUBL: 0.4000 mg | SUBLINGUAL_TABLET | SUBLINGUAL | Status: DC | PRN

## 2023-07-16 MED ORDER — FUROSEMIDE 20 MG PO TABS
20.0000 mg | ORAL_TABLET | Freq: Every day | ORAL | Status: DC
  Administered 2023-07-16: 20 mg via ORAL
  Filled 2023-07-16 (×2): qty 1

## 2023-07-16 MED ORDER — NALOXONE HCL 4 MG/0.1ML NA LIQD: 1.0000 | Freq: Once | NASAL | Status: DC | PRN

## 2023-07-16 MED ORDER — CITALOPRAM HYDROBROMIDE 20 MG PO TABS
10.0000 mg | ORAL_TABLET | Freq: Every day | ORAL | Status: DC
  Administered 2023-07-16: 10 mg via ORAL
  Filled 2023-07-16 (×2): qty 1

## 2023-07-16 MED ORDER — TROLAMINE SALICYLATE 10 % EX CREA
TOPICAL_CREAM | Freq: Two times a day (BID) | CUTANEOUS | Status: DC | PRN
  Filled 2023-07-16: qty 85

## 2023-07-16 MED ORDER — LEVETIRACETAM 750 MG PO TABS
750.0000 mg | ORAL_TABLET | Freq: Two times a day (BID) | ORAL | Status: DC
  Administered 2023-07-16: 750 mg via ORAL
  Filled 2023-07-16 (×3): qty 1

## 2023-07-16 NOTE — Hospital Course (Signed)
72yo with h/o Ogilvie's syndrome, colon cancer s/p transverse colectomy, recurrent SBO with prior recent hospitalization 10/18-22, COPD, CAD, HTN, CVA with L hemiparesis, seizure d/o, stage 3a CKD, and HFpEF who presented on 11/10 with recurrent abdominal pain, found to have SBO.  Surgery consulted.  Refused NG tube, conservative management for now.  Also with L temporal pain - vascular surgery consulted, ESR/CRP pending.

## 2023-07-16 NOTE — Plan of Care (Signed)

## 2023-07-16 NOTE — Progress Notes (Signed)
Progress Note   Patient: Calvin [name] OAC:[id number] DOB: 05-05-1951 DOA: 07/15/2023     1 DOS: the patient was seen and examined on 07/16/2023   Brief hospital course: 72yo with h/o Ogilvie's syndrome, colon cancer s/p transverse colectomy, recurrent SBO with prior recent hospitalization 10/18-22, COPD, CAD, HTN, CVA with L hemiparesis, seizure d/o, stage 3a CKD, and HFpEF who presented on 11/10 with recurrent abdominal pain, found to have SBO.  Surgery consulted.  Refused NG tube, conservative management for now.  Also with L temporal pain - vascular surgery consulted, ESR/CRP pending.    Assessment and Plan:  SBO, recurrent (small bowel obstruction) (HCC) Ogilvie syndrome/History of colon cancer s/p transverse colostomy NG tube declined by patient on admission Surgery is following, uncertain about SBO vs. Ileus Repeat KUB today looks resolved - no small bowel dilation, air in colon and he is having ostomy output Will start CLD and advance as tolerated   L temporal pain Patient described episodic stabbing pain in the L temporal area yesterday - now along L posterior head and along L body Urgent ESR/CRP ordered and unremarkable, low suspicion for temporal arteritis IV Tylenol for pain for now D/w Dr. Wallace Cullens, who is covering for vascular; he reports that >90% of temporal biopsies are negative and he would prefer to monitor clinically Today's symptoms may be related to him not receiving his vast list of home medications - will reorder and continue to follow   AKI (acute kidney injury)  Creatinine 1.84 up from baseline of 0.9 Secondary to prerenal azotemia from dehydration associated with vomiting Back to baseline today Monitor renal function and avoid nephrotoxins Resume lisinopril tomorrow   Chronic diastolic CHF (congestive heart failure)  Clinically euvolemic Hold blood pressure lowering meds in the setting of hypotension   Seizure disorder  IV Keppra to replace oral on  admission -> restart PO Keppra Resume Lamictal   HTN (hypertension) BP has normalized  Restart Toprol XL Resume lisinopril 11/12   CAD (coronary artery disease) No complaints of chest pain Restart ASA, simvastatin   COPD (chronic obstructive pulmonary disease)  Not acutely exacerbated Continue home inhalers with DuoNebs as needed   Depression with anxiety Hold Celexa until able to tolerate orally   Hemiplegia and 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 side (HCC) No acute issues Chronic pain - continue citalopram, baclofen, pregabalin  BPH Continue tamsulosin      Consultants: Surgery Vascular surgery   Procedures: None   Antibiotics: None  30 Day Unplanned Readmission Risk Score    Flowsheet Row ED to Hosp-Admission (Current) from 07/15/2023 in [facility]  30 Day Unplanned Readmission Risk Score (%) 19.1 Filed at 07/16/2023 0401       This score is the patient's risk of an unplanned readmission within 30 days of being discharged (0 -100%). The score is based on dignosis, age, lab data, medications, orders, and past utilization.   Low:  0-14.9   Medium: 15-21.9   High: 22-29.9   Extreme: 30 and above           Subjective: Today he is complaining of feeling like he is dead.  He reports pain behind his L ear and along his entire left body.     Objective: Vitals:   07/16/23 0350 07/16/23 0803  BP: 137/77 (!) 150/72  Pulse: (!) 59 69  Resp: 19 20  Temp: (!) 97.5 F (36.4 C) (!) 97.5 F (36.4 C)  SpO2: 93% 100%  Intake/Output Summary (Last 24 hours) at 07/16/2023 1417 Last data filed at 07/16/2023 4098 Gross per 24 hour  Intake 1894.64 ml  Output --  Net 1894.64 ml   Filed Weights   07/15/23 0352  Weight: 90 kg    Exam:  General:  Appears calm and comfortable and is in NAD, head mostly covered Eyes:  normal lids, iris ENT:  grossly normal hearing, lips & tongue,  mmm Neck:  no LAD, masses or thyromegaly Cardiovascular:  RRR, no m/r/g. No LE edema.  Respiratory:   CTA bilaterally with no wheezes/rales/rhonchi.  Normal respiratory effort. Abdomen:  soft, NT, ND Skin:  no rash or induration seen on limited exam Musculoskeletal:  chronic L hemiparesis, no bony abnormality Psychiatric:  blunted/cantankerous mood and affect, speech fluent and appropriate, AOx3 Neurologic:  CN 2-12 grossly intact,L hemiparesis  Data Reviewed: I have reviewed the patient's lab results since admission.  Pertinent labs for today include:   Glucose 64 CRP 1.5 ESR 12 WBC 4.7 Platelets 129 UA: trace LE    Family Communication: None present; I was unable to reach his daughter or brother by telephone  Disposition: Status is: Inpatient Remains inpatient appropriate because: ongoing evaluation and treatment     Time spent: 50 minutes  Unresulted Labs (From admission, onward)     Start     Ordered   07/17/23 0500  CBC with Differential/Platelet  Tomorrow morning,   R        07/16/23 1416   07/17/23 0500  Basic metabolic panel  Tomorrow morning,   R        07/16/23 1416             Author: Jonah Blue, MD 07/16/2023 2:17 PM  For on call review [email or web address].

## 2023-07-16 NOTE — Progress Notes (Signed)
[facility] SURGICAL PROGRESS NOTE (cpt [phone])  Hospital Day(s): 1.  Interval History: Patient seen and examined, no acute events or new complaints overnight. Patient reports he is having "pain throughout the whole left side of his body from his toes to his head." No fever, chills, nausea, emesis. He remains without leukocytosis; WBC 4.7K. Hgb to 13.0. AKI resolved; sCr - 1.08. No electrolyte derangements. KUB with air throughout colon. Ostomy with 400 ccs out.   Review of Systems:  Constitutional: denies fever, chills  HEENT: denies cough or congestion  Respiratory: denies any shortness of breath  Cardiovascular: denies chest pain or palpitations  Gastrointestinal: denies abdominal pain, N/V Genitourinary: denies burning with urination or urinary frequency  Vital signs in last 24 hours: [min-max] current  Temp:  [97.5 F (36.4 C)-98 F (36.7 C)] 97.5 F (36.4 C) (11/11 0350) Pulse Rate:  [54-65] 59 (11/11 0350) Resp:  [11-19] 19 (11/11 0350) BP: (108-142)/(72-81) 137/77 (11/11 0350) SpO2:  [93 %-100 %] 93 % (11/11 0350)     Height: 5\' 6"  (167.6 cm) Weight: 90 kg BMI (Calculated): 32.04   Intake/Output last 2 shifts:  11/10 0701 - 11/11 0700 In: 3561.8 [I.V.:2357.8; IV Piggyback:1204.1] Out: 400 [Stool:400]   Physical Exam:  Constitutional: alert, cooperative and no distress  HENT: normocephalic without obvious abnormality  Eyes: PERRL, EOM's grossly intact and symmetric  Respiratory: breathing non-labored at rest  Cardiovascular: regular rate and sinus rhythm  Gastrointestinal: soft, non-tender, and non-distended, no rebound/guarding. Loop colostomy in left abdomen, pink, patent, gas in bag.  Genitourinary: Foley in place; good UO   Labs:     Latest Ref Rng & Units 07/16/2023    4:43 AM 07/15/2023    3:27 AM 06/26/2023    4:44 AM  CBC  WBC 4.0 - 10.5 K/uL 4.7  7.2  6.4   Hemoglobin 13.0 - 17.0 g/dL 44.0  10.2  72.5   Hematocrit 39.0 - 52.0 % 41.1   52.6  38.5   Platelets 150 - 400 K/uL 129  255  186       Latest Ref Rng & Units 07/16/2023    4:43 AM 07/15/2023    3:27 AM 06/26/2023    4:44 AM  CMP  Glucose 70 - 99 mg/dL 64  366  440   BUN 8 - 23 mg/dL 10  16  11    Creatinine 0.61 - 1.24 mg/dL 3.47  4.25  9.56   Sodium 135 - 145 mmol/L 139  135  139   Potassium 3.5 - 5.1 mmol/L 4.5  4.2  3.8   Chloride 98 - 111 mmol/L 107  105  104   CO2 22 - 32 mmol/L 21  21  26    Calcium 8.9 - 10.3 mg/dL 8.7  9.6  8.6   Total Protein 6.5 - 8.1 g/dL  9.2    Total Bilirubin <1.2 mg/dL  0.6    Alkaline Phos 38 - 126 U/L  75    AST 15 - 41 U/L  16    ALT 0 - 44 U/L  11       Imaging studies:   KUB (07/16/2023) appears to show resolution in small bowel dilation with air throughout colon, and radiologist report pending...   Assessment/Plan: (ICD-10's: K53.7) 72 y.o. male with suspected ileus vs gastroenteritis now improved with return of bowel function   - Fortunately, his KUB this morning appears markedly improved and he continues to have adequate ostomy function. I do not believe he  truly has an obstruction. Would clinically favor gastroenteritis vs ileus, which appears resolved   - Okay for CLD from surgical perspective - advance as tolerated  - He certainly does not need NGT - No indication for surgical intervention   - Monitor abdominal examination; on-going bowel function   - Okay to work with therapies as feasible - Further management per primary service   All of the above findings and recommendations were discussed with the patient, and the medical team, and all of patient's questions were answered to his expressed satisfaction.  -- Lynden Oxford, PA-C [facility] 07/16/2023, 7:39 AM M-F: 7am - 4pm

## 2023-07-16 NOTE — Plan of Care (Signed)
  Problem: Education: Goal: Knowledge of General Education information will improve Description: Including pain rating scale, medication(s)/side effects and non-pharmacologic comfort measures 07/16/2023 1917 by Hardie Lora, RN Outcome: Progressing 07/16/2023 1917 by Hardie Lora, RN Outcome: Progressing   Problem: Health Behavior/Discharge Planning: Goal: Ability to manage health-related needs will improve 07/16/2023 1917 by Hardie Lora, RN Outcome: Progressing 07/16/2023 1917 by Hardie Lora, RN Outcome: Progressing   Problem: Clinical Measurements: Goal: Ability to maintain clinical measurements within normal limits will improve 07/16/2023 1917 by Hardie Lora, RN Outcome: Progressing 07/16/2023 1917 by Hardie Lora, RN Outcome: Progressing Goal: Will remain free from infection 07/16/2023 1917 by Hardie Lora, RN Outcome: Progressing 07/16/2023 1917 by Hardie Lora, RN Outcome: Progressing Goal: Diagnostic test results will improve 07/16/2023 1917 by Hardie Lora, RN Outcome: Progressing 07/16/2023 1917 by Hardie Lora, RN Outcome: Progressing Goal: Respiratory complications will improve 07/16/2023 1917 by Hardie Lora, RN Outcome: Progressing 07/16/2023 1917 by Hardie Lora, RN Outcome: Progressing Goal: Cardiovascular complication will be avoided 07/16/2023 1917 by Hardie Lora, RN Outcome: Progressing 07/16/2023 1917 by Hardie Lora, RN Outcome: Progressing   Problem: Activity: Goal: Risk for activity intolerance will decrease 07/16/2023 1917 by Hardie Lora, RN Outcome: Progressing 07/16/2023 1917 by Hardie Lora, RN Outcome: Progressing   Problem: Nutrition: Goal: Adequate nutrition will be maintained 07/16/2023 1917 by Hardie Lora, RN Outcome: Progressing 07/16/2023 1917 by Hardie Lora, RN Outcome: Progressing   Problem: Coping: Goal: Level  of anxiety will decrease 07/16/2023 1917 by Hardie Lora, RN Outcome: Progressing 07/16/2023 1917 by Hardie Lora, RN Outcome: Progressing   Problem: Elimination: Goal: Will not experience complications related to bowel motility 07/16/2023 1917 by Hardie Lora, RN Outcome: Progressing 07/16/2023 1917 by Hardie Lora, RN Outcome: Progressing Goal: Will not experience complications related to urinary retention 07/16/2023 1917 by Hardie Lora, RN Outcome: Progressing 07/16/2023 1917 by Hardie Lora, RN Outcome: Progressing   Problem: Pain Management: Goal: General experience of comfort will improve 07/16/2023 1917 by Hardie Lora, RN Outcome: Progressing 07/16/2023 1917 by Hardie Lora, RN Outcome: Progressing   Problem: Safety: Goal: Ability to remain free from injury will improve 07/16/2023 1917 by Hardie Lora, RN Outcome: Progressing 07/16/2023 1917 by Hardie Lora, RN Outcome: Progressing   Problem: Skin Integrity: Goal: Risk for impaired skin integrity will decrease 07/16/2023 1917 by Hardie Lora, RN Outcome: Progressing 07/16/2023 1917 by Hardie Lora, RN Outcome: Progressing

## 2023-07-17 ENCOUNTER — Ambulatory Visit: Payer: Medicare Other

## 2023-07-17 ENCOUNTER — Inpatient Hospital Stay: Payer: Medicare Other

## 2023-07-17 ENCOUNTER — Encounter (HOSPITAL_COMMUNITY): Payer: Self-pay

## 2023-07-17 DIAGNOSIS — K567 Ileus, unspecified: Secondary | ICD-10-CM | POA: Diagnosis not present

## 2023-07-17 DIAGNOSIS — K56609 Unspecified intestinal obstruction, unspecified as to partial versus complete obstruction: Secondary | ICD-10-CM | POA: Diagnosis not present

## 2023-07-17 DIAGNOSIS — R569 Unspecified convulsions: Secondary | ICD-10-CM | POA: Diagnosis not present

## 2023-07-17 DIAGNOSIS — G40201 Localization-related (focal) (partial) symptomatic epilepsy and epileptic syndromes with complex partial seizures, not intractable, with status epilepticus: Secondary | ICD-10-CM | POA: Diagnosis not present

## 2023-07-17 LAB — CBC WITH DIFFERENTIAL/PLATELET
Abs Immature Granulocytes: 0.01 10*3/uL (ref 0.00–0.07)
Basophils Absolute: 0 10*3/uL (ref 0.0–0.1)
Basophils Relative: 0 %
Eosinophils Absolute: 0.1 10*3/uL (ref 0.0–0.5)
Eosinophils Relative: 1 %
HCT: 40.9 % (ref 39.0–52.0)
Hemoglobin: 13 g/dL (ref 13.0–17.0)
Immature Granulocytes: 0 %
Lymphocytes Relative: 19 %
Lymphs Abs: 1.1 10*3/uL (ref 0.7–4.0)
MCH: 29 pg (ref 26.0–34.0)
MCHC: 31.8 g/dL (ref 30.0–36.0)
MCV: 91.1 fL (ref 80.0–100.0)
Monocytes Absolute: 0.4 10*3/uL (ref 0.1–1.0)
Monocytes Relative: 7 %
Neutro Abs: 4.2 10*3/uL (ref 1.7–7.7)
Neutrophils Relative %: 73 %
Platelets: 182 10*3/uL (ref 150–400)
RBC: 4.49 MIL/uL (ref 4.22–5.81)
RDW: 13 % (ref 11.5–15.5)
WBC: 5.8 10*3/uL (ref 4.0–10.5)
nRBC: 0 % (ref 0.0–0.2)

## 2023-07-17 LAB — BASIC METABOLIC PANEL
Anion gap: 13 (ref 5–15)
BUN: 11 mg/dL (ref 8–23)
CO2: 25 mmol/L (ref 22–32)
Calcium: 9.1 mg/dL (ref 8.9–10.3)
Chloride: 99 mmol/L (ref 98–111)
Creatinine, Ser: 1.14 mg/dL (ref 0.61–1.24)
GFR, Estimated: >60 mL/min (ref >60)
Glucose, Bld: 72 mg/dL (ref 70–99)
Potassium: 4.2 mmol/L (ref 3.5–5.1)
Sodium: 137 mmol/L (ref 135–145)

## 2023-07-17 LAB — GLUCOSE, CAPILLARY
Glucose-Capillary: 77 mg/dL (ref 70–99)
Glucose-Capillary: 105 mg/dL — ABNORMAL HIGH (ref 70–99)

## 2023-07-17 LAB — MRSA NEXT GEN BY PCR, NASAL: MRSA by PCR Next Gen: DETECTED — AB

## 2023-07-17 LAB — CK: Total CK: 59 U/L (ref 49–397)

## 2023-07-17 LAB — MAGNESIUM: Magnesium: 1.5 mg/dL — ABNORMAL LOW (ref 1.7–2.4)

## 2023-07-17 MED ORDER — VALPROATE SODIUM 100 MG/ML IV SOLN: 20.0000 mg/kg/d | Freq: Four times a day (QID) | INTRAVENOUS | Status: DC

## 2023-07-17 MED ORDER — LORAZEPAM 2 MG/ML IJ SOLN
2.0000 mg | Freq: Once | INTRAMUSCULAR | Status: AC
  Filled 2023-07-17: qty 1

## 2023-07-17 MED ORDER — ENOXAPARIN SODIUM 60 MG/0.6ML IJ SOSY: 0.5000 mg/kg | PREFILLED_SYRINGE | INTRAMUSCULAR | Status: DC

## 2023-07-17 MED ORDER — LEVETIRACETAM IN NACL 1000 MG/100ML IV SOLN
1000.0000 mg | Freq: Once | INTRAVENOUS | Status: AC
  Administered 2023-07-17: 1000 mg via INTRAVENOUS
  Filled 2023-07-17: qty 100

## 2023-07-17 MED ORDER — LEVETIRACETAM 500 MG PO TABS: 1000.0000 mg | ORAL_TABLET | Freq: Two times a day (BID) | ORAL | Status: DC

## 2023-07-17 MED ORDER — VALPROATE SODIUM 100 MG/ML IV SOLN
20.0000 mg/kg/d | Freq: Four times a day (QID) | INTRAVENOUS | Status: DC
  Administered 2023-07-18 (×3): 450 mg via INTRAVENOUS
  Filled 2023-07-17 (×4): qty 4.5

## 2023-07-17 MED ORDER — VALPROATE SODIUM 100 MG/ML IV SOLN
20.0000 mg/kg/d | Freq: Four times a day (QID) | INTRAVENOUS | Status: DC
  Administered 2023-07-17: 450 mg via INTRAVENOUS
  Filled 2023-07-17 (×2): qty 4.5

## 2023-07-17 MED ORDER — LORAZEPAM 2 MG/ML IJ SOLN
4.0000 mg | Freq: Once | INTRAMUSCULAR | Status: AC
  Administered 2023-07-17: 4 mg via INTRAVENOUS
  Filled 2023-07-17: qty 2

## 2023-07-17 MED ORDER — CHLORHEXIDINE GLUCONATE CLOTH 2 % EX PADS: 6.0000 | MEDICATED_PAD | Freq: Every day | CUTANEOUS | Status: DC

## 2023-07-17 MED ORDER — CHLORHEXIDINE GLUCONATE CLOTH 2 % EX PADS
6.0000 | MEDICATED_PAD | Freq: Every day | CUTANEOUS | Status: DC
  Administered 2023-07-17 – 2023-07-18 (×2): 6 via TOPICAL

## 2023-07-17 MED ORDER — MUPIROCIN 2 % EX OINT
1.0000 | TOPICAL_OINTMENT | Freq: Two times a day (BID) | CUTANEOUS | Status: DC
  Administered 2023-07-17 – 2023-07-18 (×2): 1 via NASAL
  Filled 2023-07-17: qty 22

## 2023-07-17 MED ORDER — LEVETIRACETAM IN NACL 1000 MG/100ML IV SOLN: 1000.0000 mg | Freq: Two times a day (BID) | INTRAVENOUS | Status: DC

## 2023-07-17 MED ORDER — TROLAMINE SALICYLATE 10 % EX CREA: TOPICAL_CREAM | Freq: Two times a day (BID) | CUTANEOUS | Status: DC | PRN

## 2023-07-17 MED ORDER — LEVETIRACETAM IN NACL 1000 MG/100ML IV SOLN
1000.0000 mg | Freq: Two times a day (BID) | INTRAVENOUS | Status: DC
  Administered 2023-07-17 – 2023-07-18 (×2): 1000 mg via INTRAVENOUS
  Filled 2023-07-17 (×2): qty 100

## 2023-07-17 MED ORDER — MORPHINE SULFATE (PF) 2 MG/ML IV SOLN: 2.0000 mg | INTRAVENOUS | Status: DC | PRN

## 2023-07-17 MED ORDER — PROCHLORPERAZINE EDISYLATE 10 MG/2ML IJ SOLN: 10.0000 mg | Freq: Four times a day (QID) | INTRAMUSCULAR | Status: DC | PRN

## 2023-07-17 MED ORDER — VALPROATE SODIUM 100 MG/ML IV SOLN
1500.0000 mg | Freq: Once | INTRAVENOUS | Status: AC
  Administered 2023-07-17: 1500 mg via INTRAVENOUS
  Filled 2023-07-17: qty 15

## 2023-07-17 NOTE — Consult Note (Addendum)
Neurology Consultation Reason for Consult: Seizure  Requesting Physician: Jonah Blue  CC: Seizure  History is obtained from: Chart review and primary team  HPI: Calvin [name] is a 72 y.o. 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   He presented initially with small bowel obstruction  This morning he was having concern for seizure activity which was described to me as shaking in both arms and legs but maintained awareness throughout.  However while on EEG he had twitching that started in the left side with retained ability to interact initially followed by raising up of the left hemiparetic arm followed by secondary generalization of this generalized tonic-clonic convulsion  Given concern for status epilepticus with multiple events without return to baseline gave 4 mg IV ativan  Regarding his diagnosis of QTc prolongation, on chart review this appears to have been in the setting of small bowel obstruction, with other EKGs negative  He was recently seen by neurology 04/17/2023 Per review of outpatient neurology notes (in 2018 he was on) Depakote 125 mg twice daily Vimpat 200 mg twice daily Keppra 1000 mg twice daily  Unclear why depakote and vimpat were stopped; seem to have been stopped at his facility   Neuropathic pain was inadequately controlled with gabapentin and wanted to switch to Lyrica for which she was referred back to neurology and seen on 04/17/2023  He was having left-sided numbness tingling and weakness for 2 days plan for EMG, MRI cervical and lumbar spine as well as MRI brain  In the past he has  also chronically been on benzodiazepines but it appears that this is not a current medication (Xanax 2-3 times a day)  History of atrial fibrillation per discharge summary 06/2022  "Currently rate controlled and in normal sinus rhythm.  Had paroxysmal atrial fibrillation and atrial flutter while he was in the ICU, earlier in the hospital course.  Continue metoprolol.  Defer anticoagulation to the outpatient setting." No mention of afib/flutter in subsequent cardiology follow-up on 03/28/2023   ROS: Unable to obtain due to altered mental status.   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 side (HCC)    Hypertension    Myocardial infarction (HCC)    Neuromuscular disorder (HCC)    Ogilvie syndrome    Seizures (HCC)    last 6 months ago   Stroke [facility])    multiple  left side weakness   Tremors of nervous system    Unstable angina (HCC)    Current Outpatient Medications  Medication Instructions   acetaminophen (TYLENOL) 650 mg, Oral, Every 6 hours   alum & mag hydroxide-simeth (MAALOX/MYLANTA) 200-200-20 MG/5ML suspension 30 mLs, Oral, Every 12 hours PRN   aspirin 81 mg, Oral, Daily   baclofen (LIORESAL) 5 mg, Oral, 2 times daily   bisacodyl (DULCOLAX) 5 mg, Oral, Daily   bisacodyl (DULCOLAX) 10 mg, Rectal,  Daily PRN   citalopram (CELEXA) 10 mg, Oral, Daily   fluticasone (FLONASE) 50 MCG/ACT nasal spray 2 sprays, Each Nare, Daily   furosemide (LASIX) 20 mg, Oral, Daily   lamoTRIgine (LAMICTAL) 50 mg, Oral, 2 times daily   levETIRAcetam (KEPPRA) 750 mg, Oral, 2 times daily   lisinopril (ZESTRIL) 5 mg, Oral, Daily   loratadine (CLARITIN) 10 mg, Oral, Daily   Melatonin 10 mg, Oral, Daily at  bedtime   metoprolol succinate (TOPROL-XL) 25 mg, Oral, Daily   naloxone (NARCAN) nasal spray 4 mg/0.1 mL 1 spray, Nasal,  Once   nitroGLYCERIN (NITROSTAT) 0.4 mg, Sublingual, Every 5 min PRN   ondansetron (ZOFRAN-ODT) 4 mg, Oral, Every 8 hours PRN   pantoprazole (PROTONIX) 40 mg, Oral, Every morning   polyethylene glycol (MIRALAX / GLYCOLAX) 34 g, Oral, Daily   pregabalin (LYRICA) 50 MG capsule Take 50 mg capsule twice daily and 100 mg (two capsules) at bedtime   senna (SENOKOT) 17.2 mg, Oral, At bedtime PRN   simvastatin (ZOCOR) 20 mg, Oral, Daily at bedtime   tamsulosin (FLOMAX) 0.4 mg, Oral, Every evening   tiotropium (SPIRIVA) 18 mcg, Inhalation, Daily   Wheat Dextrin (BENEFIBER DRINK MIX PO) 5 g, Oral, Daily     Family History  Problem Relation Age of Onset   Cancer Mother    Hypertension Father    Leukemia Brother     Social History:  reports that he has been smoking. He has never used smokeless tobacco. He reports current alcohol use. He reports that he does not use drugs.   Exam: Current vital signs: BP 105/67 (BP Location: Right Arm)   Pulse 80   Temp 97.9 F (36.6 C)   Resp 20   Ht 5\' 6"  (1.676 m)   Wt 90 kg   SpO2 99%   BMI 32.02 kg/m  Vital signs in last 24 hours: Temp:  [97.9 F (36.6 C)-99.2 F (37.3 C)] 97.9 F (36.6 C) (11/12 0718) Pulse Rate:  [79-103] 80 (11/12 1010) Resp:  [16-20] 20 (11/12 0718) BP: (105-154)/(67-97) 105/67 (11/12 1035) SpO2:  [92 %-99 %] 99 % (11/12 1035)   Physical Exam  Constitutional: Appears chronically ill Psych: Minimally interactive, Eyes: No scleral injection HENT: No oropharyngeal obstruction.  MSK: no major joint deformities.  Cardiovascular: Normal rate and regular rhythm on monitor Respiratory: Effort normal, non-labored breathing GI: Ostomy with bag in place  Neuro: Mental Status: Patient is very somnolent.  With sustained noxious stimulation, does orient to examiner and state "I told my left side was  hurting" otherwise no verbal output, not following commands Cranial Nerves: II: Pupils are pinpoint but reactive bilaterally III,IV, VI: Orients to examiner bilaterally V: Facial sensation is slightly reduced to eyelash brush on the left compared to the right VII: Facial movement is weaker eyelid closure on the left compared to the right.  VIII: hearing is intact to voice,  Motor: Purposeful and antigravity with the right upper extremity, localizing. Slight movement of RLE to noxious stim.  Minimal movement on the left side, other than a 30 sec episode of left foot twitching Sensory: Less responsive to stimulation on the left compared to the right  I have reviewed labs in epic and the results pertinent to this consultation are:  Basic Metabolic Panel: Recent Labs  Lab 07/15/23 0327 07/16/23 0443 07/17/23 0924  NA 135 139 137  K 4.2 4.5 4.2  CL 105 107 99  CO2 21* 21* 25  GLUCOSE 121* 64* 72  BUN 16 10 11   CREATININE 1.84* 1.08 1.14  CALCIUM 9.6 8.7* 9.1  MG 2.1  --   --     CBC: Recent Labs  Lab 07/15/23 0327 07/16/23 0443 07/17/23 0924  WBC 7.2 4.7 5.8  NEUTROABS 4.9 3.0 4.2  HGB 16.2 13.0 13.0  HCT 52.6* 41.1 40.9  MCV 94.3 92.8 91.1  PLT 255 129* 182    Coagulation Studies: No results for input(s): "LABPROT", "INR" in the last 72 hours.    I have reviewed the images obtained:  Head CT  No acute finding.   Atrophy and multifocal encephalomalacia as described. Asymmetric right hemispheric atrophy.  EKG reviewed, evidence of PR prolongation   Impression: Focal status in the setting of potentially missed medications at home/poor absorption due to SBO.  His seizure medications appear to have also been reduced as he does not seem to be on chronic benzodiazepines that he used to take before and Depakote has also been stopped which he was started on in September 2023 again when he presented with concern for seizure.  Additionally needs clarification of his  diagnosis of atrial fibrillation as this would put him at high risk of stroke given his CHA2DS2-VASc score --and given Depakote can reduce efficacy of Eliquis for example, may affect choice of seizure medication going forward. For now we will start with Depakote  Recommendations: # Focal seizures secondary generalization w/ status epilepticus  -4 mg Ativan IV once  -Keppra 1000 mg x 1 dose, increase to 1000 mg BID (Max for his CrCl of 60) -Vimpat contraindicated due to PR prolongation EEG.  Remote history of QTc prolongation as well -Will load with depakote 1500 mg x 1 dose followed by 20 mg / kg divided every 6 hours  -Depakote level to be checked in 1-2 days (will need to be ordered) -Ammonia level in the morning (as depakote is being started) -CK  -Add on magnesium -Cannot take home lyrica and lamotrigine at this time due to mental status, would plan to resuming when able -Transfer to [facility] for LTM EEG if bed available, neurology will follow in consultation. I will continue to follow along here if patient cannot be transferred   # Possible paroxysmal atrial fibrillation not on anticoagulation -MRI brain when stabilized -Tele -May need alternative to depakote if he does have Afib confirmed and needs anticogulation   Brooke Dare MD-PhD [facility] [phone] Available 7 AM to 7 PM, outside these hours please contact Neurologist on call listed on AMION    Total critical care time: 90 minutes   Critical care time was exclusive of separately billable procedures and treating other patients.   Critical care was necessary to treat or prevent imminent or life-threatening deterioration.   Critical care was time spent personally by me on the following activities: development of treatment plan with patient and/or surrogate as well as nursing, discussions with consultants/primary team, evaluation of patient's response to treatment, examination of patient, obtaining  history from patient or surrogate, ordering and performing treatments and interventions, ordering and review of laboratory studies, ordering and review of radiographic studies, and re-evaluation of patient's condition as needed, as documented above.

## 2023-07-17 NOTE — [place] FL2 (Signed)
[place] MEDICAID FL2 LEVEL OF CARE FORM     IDENTIFICATION  [name] Birthdate: October 31, 1950 Sex: male Admission Date (Current Location): 07/15/2023  [place] and [place] Number:  Chiropodist and Address:  [facility], [address] [place], [place] [address]      Provider Number: [id number]  Attending Physician Name and Address:  Jonah Blue, MD  Relative Name and Phone Number:       Current Level of Care: Hospital Recommended Level of Care: Skilled Nursing Facility Prior Approval Number:    Date Approved/Denied:   PASRR Number: [id number]  Discharge Plan: SNF    Current Diagnoses: Patient Active Problem List   Diagnosis Date Noted   Hypotension 07/15/2023   Small bowel obstruction (HCC) 07/15/2023   Hemiplegia and 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 side (HCC)    Temporal pain 06/18/2022   AKI (acute kidney injury) (HCC) 06/17/2022   Dysphagia 06/07/2022   Respiratory distress 06/02/2022   Neuropathy 06/02/2022   Paroxysmal atrial flutter (HCC) 06/01/2022   Aspiration pneumonia (HCC) 06/01/2022   Cervical spinal stenosis 05/31/2022   Chronic diastolic CHF (congestive heart failure) (HCC) 05/28/2022   SBO, recurrent (small bowel obstruction) (HCC)    Abdominal distention    HLD (hyperlipidemia) 10/16/2021   Nausea vomiting and diarrhea 10/16/2021   Sepsis (HCC) 10/16/2021   Tobacco abuse 10/16/2021   Muscle twitching 08/14/2019   UTI (urinary tract infection) 08/03/2019   Ileus (HCC) 08/03/2019   Hypokalemia 08/03/2019   QT prolongation 08/03/2019   Ogilvie syndrome    Acute abdominal pain 04/03/2019   Left-sided weakness 09/30/2017   Seizure disorder (HCC) 09/30/2017   HTN (hypertension) 09/30/2017   CAD (coronary artery disease) 09/30/2017   COPD (chronic obstructive pulmonary disease) (HCC) 09/30/2017   Depression with anxiety 09/30/2017    Orientation  RESPIRATION BLADDER Height & Weight     Self, Situation, Place, Time  O2 (Nasal cannula 2 L) Incontinent Weight: 198 lb 6.6 oz (90 kg) Height:  5\' 6"  (167.6 cm)  BEHAVIORAL SYMPTOMS/MOOD NEUROLOGICAL BOWEL NUTRITION STATUS   (None)  (Seizure disorder) Colostomy Diet (Follow for discharge recommendations. Currently on clear liquids. Advancing as tolerated.)  AMBULATORY STATUS COMMUNICATION OF NEEDS Skin     Verbally Bruising                       Personal Care Assistance Level of Assistance              Functional Limitations Info  Sight, Hearing, Speech Sight Info: Adequate Hearing Info: Adequate Speech Info: Adequate    SPECIAL CARE FACTORS FREQUENCY                       Contractures Contractures Info: Not present    Additional Factors Info  Code Status, Allergies Code Status Info: Full code Allergies Info: NKDA           Current Medications (07/17/2023):  This is the current hospital active medication list Current Facility-Administered Medications  Medication Dose Route Frequency Provider Last Rate Last Admin   alum & mag hydroxide-simeth (MAALOX/MYLANTA) 200-200-20 MG/5ML suspension 30 mL  30 mL Oral Q12H PRN Jonah Blue, MD       aspirin chewable tablet 81 mg  81 mg Oral Daily Jonah Blue, MD   81 mg at 07/17/23 1008   baclofen (LIORESAL) tablet 5 mg  5 mg Oral BID Jonah Blue, MD  5 mg at 07/17/23 1008   bisacodyl (DULCOLAX) EC tablet 5 mg  5 mg Oral Daily PRN Jonah Blue, MD       bisacodyl (DULCOLAX) suppository 10 mg  10 mg Rectal Daily PRN Jonah Blue, MD       citalopram (CELEXA) tablet 10 mg  10 mg Oral Daily Jonah Blue, MD   10 mg at 07/17/23 1008   enoxaparin (LOVENOX) injection 45 mg  0.5 mg/kg Subcutaneous Q24H Lindajo Royal V, MD   45 mg at 07/17/23 1007   fluticasone (FLONASE) 50 MCG/ACT nasal spray 2 spray  2 spray Each Nare Daily Jonah Blue, MD   2 spray at 07/17/23 1029   furosemide (LASIX) tablet 20 mg   20 mg Oral Daily Jonah Blue, MD   20 mg at 07/17/23 1008   levETIRAcetam (KEPPRA) tablet 750 mg  750 mg Oral BID Jonah Blue, MD   750 mg at 07/17/23 1009   lisinopril (ZESTRIL) tablet 5 mg  5 mg Oral Daily Jonah Blue, MD   5 mg at 07/17/23 1008   loratadine (CLARITIN) tablet 10 mg  10 mg Oral Daily Jonah Blue, MD   10 mg at 07/17/23 1008   melatonin tablet 10 mg  10 mg Oral Noemi Chapel, MD   10 mg at 07/16/23 2129   metoprolol succinate (TOPROL-XL) 24 hr tablet 25 mg  25 mg Oral Daily Jonah Blue, MD   25 mg at 07/17/23 1008   morphine (PF) 2 MG/ML injection 2 mg  2 mg Intravenous Q2H PRN Andris Baumann, MD   2 mg at 07/17/23 1005   naloxone (NARCAN) nasal spray 4 mg/0.1 mL  1 spray Nasal Once PRN Jonah Blue, MD       nitroGLYCERIN (NITROSTAT) SL tablet 0.4 mg  0.4 mg Sublingual Q5 min PRN Jonah Blue, MD       ondansetron (ZOFRAN-ODT) disintegrating tablet 4 mg  4 mg Oral Q8H PRN Jonah Blue, MD   4 mg at 07/16/23 1907   pantoprazole (PROTONIX) EC tablet 40 mg  40 mg Oral q AM Jonah Blue, MD   40 mg at 07/17/23 1009   polyethylene glycol (MIRALAX / GLYCOLAX) packet 34 g  34 g Oral Daily Jonah Blue, MD   34 g at 07/17/23 1008   pregabalin (LYRICA) capsule 100 mg  100 mg Oral QHS Ronnald Ramp, RPH   100 mg at 07/16/23 2129   pregabalin (LYRICA) capsule 50 mg  50 mg Oral BID Jonah Blue, MD   50 mg at 07/17/23 1009   prochlorperazine (COMPAZINE) injection 10 mg  10 mg Intravenous Q6H PRN Andris Baumann, MD       senna (SENOKOT) tablet 17.2 mg  2 tablet Oral QHS PRN Jonah Blue, MD       simvastatin (ZOCOR) tablet 20 mg  20 mg Oral Noemi Chapel, MD   20 mg at 07/16/23 2129   tamsulosin (FLOMAX) capsule 0.4 mg  0.4 mg Oral QPM Jonah Blue, MD   0.4 mg at 07/16/23 1907   tiotropium [facility]) inhalation capsule ([facility] use ONLY) 18 mcg  18 mcg Inhalation Daily Andris Baumann, MD   18 mcg at 07/16/23 0820   trolamine salicylate  (ASPERCREME) 10 % cream   Topical BID PRN Jonah Blue, MD         Discharge Medications: Please see discharge summary for a list of discharge medications.  Relevant Imaging Results:  Relevant Lab Results:   Additional  Information SS#: [id number]  Margarito Liner, LCSW

## 2023-07-17 NOTE — Procedures (Signed)
[name]  MRN: [id number]  Epilepsy Attending: Charlsie Quest  Referring Physician/Provider: Jonah Blue, MD  Date: 07/17/2023 Duration: 23.58 mins  Patient history: 72yo M with seizure like activity getting eeg to evaluate for seizure  Level of alertness: Awake  AEDs during EEG study: LEV, PGB, Ativan  Technical aspects: This EEG study was done with scalp electrodes positioned according to the 10-20 International system of electrode placement. Electrical activity was reviewed with band pass filter of 1-70Hz , sensitivity of 7 uV/mm, display speed of 54mm/sec with a 60Hz  notched filter applied as appropriate. EEG data were recorded continuously and digitally stored.  Video monitoring was available and reviewed as appropriate.  Description: At the beginning of the study, EEG showed continuous generalized 3-5hz  theta- delta slowing.  At 1150, patient was noted to have left side face and head jerking. Concomitant EEG showed sharp waves in left parieto-occipital region admixed with 2 to 3 Hz delta slowing which quickly involved right parieto-occipital region.  Subsequently there was significant myogenic artifact making the study difficult to interpret.  Seizure ended at 1152.  Second seizure was noted at 1155 arising from left right occipital region.  No clinical signs were noted during the seizure.  Duration of seizure was about 30 seconds.  Third seizure was noted at 1202 arising from left parieto-occipital region during which she was noted to have left-sided twitching.  Duration of seizure was about 45 seconds.  Fourth seizure was noted at 1206 as above left right occipital region without any clinical signs.  Duration of seizure was about 30 seconds.  IV Ativan was administered at around 1209.  Subsequently EEG showed continuous generalized low amplitude 5 to 8 Hz theta- alpha activity admixed with intermittent generalized 2 to 3 Hz delta slowing.  Hyperventilation  and photic stimulation were not performed.     ABNORMALITY - Focal seizure, left parieto-occipital region - Continuous slow, generalized  IMPRESSION: This study showed 4 focal seizures arising from left parieto-occipital region as described above lasting 30 seconds to 2 minutes each.  Additionally there is moderate diffuse encephalopathy.    Dr. Iver Nestle was notified.     Erian Rosengren Annabelle Harman

## 2023-07-17 NOTE — Plan of Care (Signed)
  Problem: Clinical Measurements: Goal: Ability to maintain clinical measurements within normal limits will improve Outcome: Progressing Goal: Will remain free from infection Outcome: Progressing Goal: Cardiovascular complication will be avoided Outcome: Progressing   Problem: Elimination: Goal: Will not experience complications related to urinary retention Outcome: Progressing   Problem: Pain Management: Goal: General experience of comfort will improve Outcome: Progressing   Problem: Safety: Goal: Ability to remain free from injury will improve Outcome: Progressing   Problem: Education: Goal: Knowledge of General Education information will improve Description: Including pain rating scale, medication(s)/side effects and non-pharmacologic comfort measures Outcome: Not Progressing   Problem: Health Behavior/Discharge Planning: Goal: Ability to manage health-related needs will improve Outcome: Not Progressing   Problem: Elimination: Goal: Will not experience complications related to bowel motility Outcome: Not Progressing

## 2023-07-17 NOTE — Progress Notes (Signed)
Progress Note   Patient: Calvin [name] UXL:[id number] DOB: 1951/08/07 DOA: 07/15/2023     2 DOS: the patient was seen and examined on 07/17/2023   Brief hospital course: 72yo with h/o Ogilvie's syndrome, colon cancer s/p transverse colectomy, recurrent SBO with prior recent hospitalization 10/18-22, COPD, CAD, HTN, CVA with L hemiparesis, seizure d/o, stage 3a CKD, and HFpEF who presented on 11/10 with recurrent abdominal pain, found to have SBO.  Surgery consulted.  Refused NG tube, conservative management for now.  Also with L temporal pain - vascular surgery consulted, ESR/CRP pending.   Assessment and Plan:  SBO, recurrent (small bowel obstruction) (HCC) Ogilvie syndrome/History of colon cancer s/p transverse colostomy NG tube declined by patient on admission Surgery is following, uncertain about SBO vs. Ileus Repeat KUB today looks resolved - no small bowel dilation, air in colon and he is having ostomy output Will start CLD and advance as tolerated   L temporal pain Patient described episodic stabbing pain in the L temporal area yesterday - now along L posterior head and along L body Urgent ESR/CRP ordered and unremarkable, low suspicion for temporal arteritis IV Tylenol for pain for now D/w Dr. Wallace Cullens, who is covering for vascular; he reports that >90% of temporal biopsies are negative and he would prefer to monitor clinically Today's symptoms may be related to him not receiving his vast list of home medications - will reorder and continue to follow   AKI (acute kidney injury)  Creatinine 1.84 up from baseline of 0.9 Secondary to prerenal azotemia from dehydration associated with vomiting Back to baseline today Monitor renal function and avoid nephrotoxins Resume lisinopril tomorrow   Chronic diastolic CHF (congestive heart failure)  Clinically euvolemic Hold blood pressure lowering meds in the setting of hypotension   Seizure disorder  IV Keppra to replace oral on  admission -> restart PO Keppra Resume Lamictal   HTN (hypertension) BP has normalized  Restart Toprol XL Resume lisinopril 11/12   CAD (coronary artery disease) No complaints of chest pain Restart ASA, simvastatin   COPD (chronic obstructive pulmonary disease)  Not acutely exacerbated Continue home inhalers with DuoNebs as needed   Depression with anxiety Hold Celexa until able to tolerate orally   Hemiplegia and 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 side (HCC) No acute issues Chronic pain - continue citalopram, baclofen, pregabalin   BPH Continue tamsulosin       Consultants: Surgery Vascular surgery   Procedures: None   Antibiotics: None      30 Day Unplanned Readmission Risk Score    Flowsheet Row ED to Hosp-Admission (Current) from 07/15/2023 in [facility]  30 Day Unplanned Readmission Risk Score (%) 25.48 Filed at 07/17/2023 1200       This score is the patient's risk of an unplanned readmission within 30 days of being discharged (0 -100%). The score is based on dignosis, age, lab data, medications, orders, and past utilization.   Low:  0-14.9   Medium: 15-21.9   High: 22-29.9   Extreme: 30 and above           Subjective: This AM, he was crying out with abdominal pain when I went to see him.  Little ostomy output, concern for recurrent SBO.  STAT KBO without concern for SBO, seen by surgery PA.  Meanwhile, he was having jerking episodes but remained conscious and able to converse.  RRT called but he was not clearly seizing so stat head CT and EEG  ordered.  In EEG, he was in status epilepticus with partial seizures and then converted to GTC seizure.  He was given 4 mg IV ativan per neurology and then transferred to ICU.  He is currently protecting his airway and appears to need to transfer to [facility] for continuous EEG.   Objective: Vitals:   07/17/23 1010 07/17/23 1035  BP: (!) 125/97  105/67  Pulse: 80   Resp:    Temp:    SpO2: 99% 99%    Intake/Output Summary (Last 24 hours) at 07/17/2023 1212 Last data filed at 07/17/2023 1037 Gross per 24 hour  Intake --  Output 1900 ml  Net -1900 ml   Filed Weights   07/15/23 0352  Weight: 90 kg    Exam:  General:  Appears uncomfortable, jerking movements with preserved cognition Eyes:   normal lids, iris ENT:  grossly normal hearing, lips & tongue, mmm Neck:  no LAD, masses or thyromegaly Cardiovascular:  RRR, no m/r/g. No LE edema.  Respiratory:   CTA bilaterally with no wheezes/rales/rhonchi.  Normal respiratory effort. Abdomen:  mildly distended but not frankly TTP, scant liquid drainage from loop ostomy Skin:  no rash or induration seen on limited exam Musculoskeletal:  chronic L hemiparesis Psychiatric:  blunted mood and affect, speech fluent and appropriate Neurologic:  CN 2-12 grossly intact, chronic L hemiparesis  Data Reviewed: I have reviewed the patient's lab results since admission.  Pertinent labs for today include:   Normal CBC Normal BMP     Family Communication: None present; I was again unable to reach his daughter or brother by telephone but was able to speak with his sister; she reports that she should be the primary contact and will keep her brother posted on the patient's condition  Disposition: Status is: Inpatient Remains inpatient appropriate because: status epilepticus   Total critical care time: 55 minutes Critical care time was exclusive of separately billable procedures and treating other patients. Critical care was necessary to treat or prevent imminent or life-threatening deterioration. Critical care was time spent personally by me on the following activities: development of treatment plan with patient and/or surrogate as well as nursing, discussions with consultants, evaluation of patient's response to treatment, examination of patient, obtaining history from patient or  surrogate, ordering and performing treatments and interventions, ordering and review of laboratory studies, ordering and review of radiographic studies, pulse oximetry and re-evaluation of patient's condition.   Author: Jonah Blue, MD 07/17/2023 12:12 PM  For on call review [email or web address].

## 2023-07-17 NOTE — Plan of Care (Signed)
Discussed briefly with Dr. Ophelia Charter.  She describes concern for possible seizure activity, but the event is described as the patient having shaking all over with less focused eyes/glassy eyes and possibly somewhat sleepy afterwards but still speaking and communicating throughout the event.  This semiology is highly unlikely to be seizure activity given generalized shaking with retained awareness.  I do think it is reasonable to complete head CT and EEG if not at baseline  Per his last neurology visit, he is on the following medications for pain/seizures - lyrica to 50mg  in the morning, 50mg  in the afternoon and 100 mg at night (has been ordered) - lamictal 50mg  twice daily --consider restarting - Keppra 750 mg twice daily (has been ordered)  These are curbside recommendations based upon the information readily available in the chart on brief review as well as history and examination information provided to me by requesting provider and do not replace a full detailed consult

## 2023-07-17 NOTE — Progress Notes (Signed)
[facility] SURGICAL PROGRESS NOTE (cpt [phone])  Hospital Day(s): 2.  Interval History:  Asked to see the patient again this AM secondary to complaints of pain. Also responded to rapid response called on patient around 1015 AM for shaking/tremors   Patient seen and examined with RN staff and Hospitalist MD. Patient is alert and responsive to verbal questions. He is unable to tell be about abdominal tenderness. He is having ostomy function but less than yesterday; 100 ccs recorded. He did get a KUB which I have reviewed and continued to show air throughout colon, no gastric distension. Labs remain reassuring.   Review of Systems:  Constitutional: denies fever, chills  HEENT: denies cough or congestion  Respiratory: denies any shortness of breath  Cardiovascular: denies chest pain or palpitations  Gastrointestinal: denies abdominal pain, N/V Genitourinary: denies burning with urination or urinary frequency  Vital signs in last 24 hours: [min-max] current  Temp:  [97.9 F (36.6 C)-99.2 F (37.3 C)] 97.9 F (36.6 C) (11/12 0718) Pulse Rate:  [79-103] 80 (11/12 0718) Resp:  [16-20] 20 (11/12 0718) BP: (125-154)/(74-94) 153/74 (11/12 0718) SpO2:  [92 %-96 %] 94 % (11/12 0718)     Height: 5\' 6"  (167.6 cm) Weight: 90 kg BMI (Calculated): 32.04   Intake/Output last 2 shifts:  11/11 0701 - 11/12 0700 In: -  Out: 2100 [Urine:2000; Stool:100]   Physical Exam:  Constitutional: alert, cooperative and no distress  HENT: normocephalic without obvious abnormality  Eyes: PERRL, EOM's grossly intact and symmetric  Respiratory: breathing non-labored at rest  Cardiovascular: regular rate and sinus rhythm  Gastrointestinal: soft, non-tender, and non-distended, no rebound/guarding. Loop colostomy in left abdomen, pink, patent, gas in bag.  Genitourinary: Foley in place; good UO   Labs:     Latest Ref Rng & Units 07/17/2023    9:24 AM 07/16/2023    4:43 AM 07/15/2023    3:27  AM  CBC  WBC 4.0 - 10.5 K/uL 5.8  4.7  7.2   Hemoglobin 13.0 - 17.0 g/dL 33.2  95.1  88.4   Hematocrit 39.0 - 52.0 % 40.9  41.1  52.6   Platelets 150 - 400 K/uL 182  129  255       Latest Ref Rng & Units 07/17/2023    9:24 AM 07/16/2023    4:43 AM 07/15/2023    3:27 AM  CMP  Glucose 70 - 99 mg/dL 72  64  166   BUN 8 - 23 mg/dL 11  10  16    Creatinine 0.61 - 1.24 mg/dL 0.63  0.16  0.10   Sodium 135 - 145 mmol/L 137  139  135   Potassium 3.5 - 5.1 mmol/L 4.2  4.5  4.2   Chloride 98 - 111 mmol/L 99  107  105   CO2 22 - 32 mmol/L 25  21  21    Calcium 8.9 - 10.3 mg/dL 9.1  8.7  9.6   Total Protein 6.5 - 8.1 g/dL   9.2   Total Bilirubin <1.2 mg/dL   0.6   Alkaline Phos 38 - 126 U/L   75   AST 15 - 41 U/L   16   ALT 0 - 44 U/L   11      Imaging studies:   KUB (07/17/2023) personally reviewed, continues to show air throughout the colon, no significant small bowel nor gastric distension, and radiologist report pending...   Assessment/Plan: (ICD-10's: K39.7) 72 y.o. male with suspected ileus vs gastroenteritis now  improved with return of bowel function   - Responded to patient during rapid response call. I do not think he has any acute etiology regarding his abdomen. His examination is benign and KUB remains reassuring. I do not think he warrants any placement of NGT. I do not think he needs any surgical intervention. If work up regarding his tremors/shaking is reassuring, we can continue diet cautiously.    - Monitor abdominal examination; on-going bowel function   - Okay to work with therapies as feasible - Further management per primary service   All of the above findings and recommendations were discussed with the patient, and the medical team, and all of patient's questions were answered to his expressed satisfaction.  -- Lynden Oxford, PA-C [facility] 07/17/2023, 10:13 AM M-F: 7am - 4pm

## 2023-07-17 NOTE — Progress Notes (Signed)
Eeg done 

## 2023-07-17 NOTE — Progress Notes (Signed)
   07/17/23 1200  Spiritual Encounters  Type of Visit Attempt (pt unavailable)  Spiritual Care Plan  Spiritual Care Issues Still Outstanding Chaplain will continue to follow   Responded to Rapid Response unable to see patient medical team was doing their assessment. There was no family of the patient during this time.

## 2023-07-17 NOTE — TOC Initial Note (Signed)
Transition of Care [facility]) - Initial/Assessment Note    Patient Details  Name: Calvin [name] MRN: [id number] Date of Birth: March 04, 1951  [facility] CM/SW Contact:    Margarito Liner, LCSW Phone Number: 07/17/2023, 12:21 PM  Clinical Narrative: CSW is familiar with patient from previous admissions. He is a long-term resident at [facility]. CSW called admissions coordinator and confirmed.                  Expected Discharge Plan: Skilled Nursing Facility Barriers to Discharge: Continued Medical Work up   Patient Goals and CMS Choice            Expected Discharge Plan and Services       Living arrangements for the past 2 months: Skilled Nursing Facility                                      Prior Living Arrangements/Services Living arrangements for the past 2 months: Skilled Nursing Facility Lives with:: Facility Resident Patient language and need for interpreter reviewed:: Yes        Need for Family Participation in Patient Care: Yes (Comment) Care giver support system in place?: Yes (comment)   Criminal Activity/Legal Involvement Pertinent to Current Situation/Hospitalization: No - Comment as needed  Activities of Daily Living   ADL Screening (condition at time of admission) Is the patient deaf or have difficulty hearing?: No Does the patient have difficulty seeing, even when wearing glasses/contacts?: No Does the patient have difficulty concentrating, remembering, or making decisions?: No  Permission Sought/Granted         Permission granted to share info w AGENCY: [facility]        Emotional Assessment       Orientation: : Oriented to Self, Oriented to Place, Oriented to  Time, Oriented to Situation Alcohol / Substance Use: Not Applicable Psych Involvement: No (comment)  Admission diagnosis:  Small bowel obstruction (HCC) [K56.609] AKI (acute kidney injury) (HCC) [N17.9] Patient Active Problem List    Diagnosis Date Noted   Hypotension 07/15/2023   Small bowel obstruction (HCC) 07/15/2023   Hemiplegia and 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 side (HCC)    Temporal pain 06/18/2022   AKI (acute kidney injury) (HCC) 06/17/2022   Dysphagia 06/07/2022   Respiratory distress 06/02/2022   Neuropathy 06/02/2022   Paroxysmal atrial flutter (HCC) 06/01/2022   Aspiration pneumonia (HCC) 06/01/2022   Cervical spinal stenosis 05/31/2022   Chronic diastolic CHF (congestive heart failure) (HCC) 05/28/2022   SBO, recurrent (small bowel obstruction) (HCC)    Abdominal distention    HLD (hyperlipidemia) 10/16/2021   Nausea vomiting and diarrhea 10/16/2021   Sepsis (HCC) 10/16/2021   Tobacco abuse 10/16/2021   Muscle twitching 08/14/2019   UTI (urinary tract infection) 08/03/2019   Ileus (HCC) 08/03/2019   Hypokalemia 08/03/2019   QT prolongation 08/03/2019   Ogilvie syndrome    Acute abdominal pain 04/03/2019   Left-sided weakness 09/30/2017   Seizure disorder (HCC) 09/30/2017   HTN (hypertension) 09/30/2017   CAD (coronary artery disease) 09/30/2017   COPD (chronic obstructive pulmonary disease) (HCC) 09/30/2017   Depression with anxiety 09/30/2017   PCP:  Lucita Ferrara, MD Pharmacy:   [facility] - [place], [place] - [address] [place] [address] Phone: [phone] Fax: [phone]  [id number] - [place] ([place] -  [address] [place] ([place]) [place] [address] Phone: [phone] Fax: [phone]     Social Determinants of Health (SDOH) Social History: SDOH Screenings   Food Insecurity: No Food Insecurity (07/15/2023)  Housing: Low Risk  (07/15/2023)  Transportation Needs: No Transportation Needs (07/15/2023)  Utilities: Not At Risk (07/15/2023)  Tobacco Use: Medium Risk (07/09/2023)   Received from [facility]   SDOH  Interventions:     Readmission Risk Interventions    06/30/2022    2:25 PM 06/02/2022   12:02 PM 10/17/2021   10:47 AM  Readmission Risk Prevention Plan  Transportation Screening Complete Complete Complete  PCP or Specialist Appt within 3-5 Days   Complete  Social Work Consult for Recovery Care Planning/Counseling   Complete  Palliative Care Screening   Not Applicable  Medication Review [employer])  Complete Complete  PCP or Specialist appointment within 3-5 days of discharge  Complete   HRI or Home Care Consult Complete Complete   SW Recovery Care/Counseling Consult Complete Complete   Palliative Care Screening Complete Complete   Skilled Nursing Facility Complete Complete

## 2023-07-17 NOTE — Significant Event (Signed)
Rapid Response Event Note   Reason for Call :  Seizure- like activity  Initial Focused Assessment:  Bedside RN- stated that patient is having his 3rd seizure this shift.  Patient vitals stable and alert and oriented.  Then patient would have tremors, stop talking, then become alert and oriented.  Dr. Ophelia Charter and Ian Malkin, Surgery PA at bedside.  KUB was ordered for distention of abdomen.     Interventions:  Dr. Ophelia Charter ordered for NG placement  Plan of Care:     Event Summary:   MD Notified: Dr. Ophelia Charter Call Time: unknown Arrival Time: MD at bedside at my arrival End Time:10:35  Vincente Poli, RN

## 2023-07-17 NOTE — Discharge Summary (Signed)
Physician Discharge Summary   Patient: Calvin [name] MRN: [id number] DOB: [birth year]  Admit date:     07/15/2023  Discharge date: 07/17/23  Discharge Physician: Jonah Blue   PCP: Lucita Ferrara, MD   Recommendations at discharge:   You are being transferred to [facility] for long-term EEG monitoring and further care.  Discharge Diagnoses: Principal Problem:   SBO, recurrent (small bowel obstruction) (HCC) Active Problems:   Ogilvie syndrome   AKI (acute kidney injury) (HCC)   Hypotension   Chronic diastolic CHF (congestive heart failure) (HCC)   Seizure disorder (HCC)   HTN (hypertension)   CAD (coronary artery disease)   COPD (chronic obstructive pulmonary disease) (HCC)   Depression with anxiety   Temporal pain   Hemiplegia and 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 side [facility])    Hospital Course: 72yo with h/o Ogilvie's syndrome, colon cancer s/p transverse colectomy, recurrent SBO with prior recent hospitalization 10/18-22, COPD, CAD, HTN, CVA with L hemiparesis, seizure d/o, stage 3a CKD, and HFpEF who presented on 11/10 with recurrent abdominal pain, found to have SBO.  Surgery consulted.  Refused NG tube, conservative management for now.  Also with L temporal pain - vascular surgery consulted, ESR/CRP pending.   Assessment and Plan:    Seizure disorder with status epilepticus IV Keppra to replace oral on admission -> restarted PO Keppra Patient with status epilepticus on 11/12 - possibly related to poor absorption in the setting of SBO EEG with focal seizures and secondary generalization with status epilepticus Given 4 mg IV Ativan x 1 and moved to ICU at [facility] for ongoing monitoring Given an additional dose of Keppra 1000 mg IV and changed to 1000 mg BID Loaded with Depakote per neurology Resume Lamictal, Lyrica when able to take PO Transfer to [facility] for LTM monitoring Neurology will follow in consultation MRI recommended  when able  SBO, recurrent (small bowel obstruction) (HCC) Ogilvie syndrome/History of colon cancer s/p transverse colostomy NG tube declined by patient on admission Surgery is following, uncertain about SBO vs. Ileus Repeat KUB today looks resolved - no small bowel dilation, air in colon and he is having ostomy output Will start CLD and advance as tolerated   L temporal pain Patient described episodic stabbing pain in the L temporal area yesterday - now along L posterior head and along L body Urgent ESR/CRP ordered and unremarkable, low suspicion for temporal arteritis IV Tylenol for pain for now D/w Dr. Wallace Cullens, who is covering for vascular; he reports that >90% of temporal biopsies are negative and he would prefer to monitor clinically Today's symptoms may be related to him not receiving his vast list of home medications - will reorder and continue to follow   AKI (acute kidney injury)  Creatinine 1.84 up from baseline of 0.9 Secondary to prerenal azotemia from dehydration associated with vomiting Back to baseline today Monitor renal function and avoid nephrotoxins Resume lisinopril tomorrow   Chronic diastolic CHF (congestive heart failure)  Clinically euvolemic Hold blood pressure lowering meds in the setting of hypotension   HTN (hypertension) BP has normalized  Restart Toprol XL Resume lisinopril 11/12   CAD (coronary artery disease) No complaints of chest pain Restart ASA, simvastatin   COPD (chronic obstructive pulmonary disease)  Not acutely exacerbated Continue home inhalers with DuoNebs as needed   Depression with anxiety Hold Celexa until able to tolerate orally   Hemiplegia and 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 side (HCC) No acute issues Chronic  pain - continue citalopram, baclofen, pregabalin Neurology brought up question of afib - EKG pending 02/26/23 EKG read as afib but appears to be sinus bradycardia on review    BPH Continue tamsulosin       Consultants: Surgery Vascular surgery   Procedures: None   Antibiotics: None     Disposition:  [facility] Diet recommendation:  NPO until able to effectively pass swallow evaluation DISCHARGE MEDICATION: Allergies as of 07/17/2023   No Known Allergies      Medication List     STOP taking these medications    furosemide 20 MG tablet Commonly known as: LASIX   levETIRAcetam 750 MG tablet Commonly known as: KEPPRA       TAKE these medications    acetaminophen 325 MG tablet Commonly known as: TYLENOL Take 650 mg by mouth every 6 (six) hours.   alum & mag hydroxide-simeth 200-200-20 MG/5ML suspension Commonly known as: MAALOX/MYLANTA Take 30 mLs by mouth every 12 (twelve) hours as needed for indigestion or heartburn.   aspirin 81 MG chewable tablet Take 81 mg by mouth daily.   baclofen 5 mg Tabs tablet Commonly known as: LIORESAL Take 5 mg by mouth 2 (two) times daily.   BENEFIBER DRINK MIX PO Take 5 g by mouth daily.   bisacodyl 5 MG EC tablet Commonly known as: DULCOLAX Take 5 mg by mouth daily.   bisacodyl 10 MG suppository Commonly known as: DULCOLAX Place 1 suppository (10 mg total) rectally daily as needed for moderate constipation or mild constipation.   Chlorhexidine Gluconate Cloth 2 % Pads Apply 6 each topically daily. Start taking on: July 18, 2023   citalopram 10 MG tablet Commonly known as: CELEXA Take 10 mg by mouth daily.   enoxaparin 60 MG/0.6ML injection Commonly known as: LOVENOX Inject 0.45 mLs (45 mg total) into the skin daily. Start taking on: July 18, 2023   fluticasone 50 MCG/ACT nasal spray Commonly known as: FLONASE Place 2 sprays into both nostrils daily.   lamoTRIgine 25 MG tablet Commonly known as: LAMICTAL Take 50 mg by mouth 2 (two) times daily.   levETIRAcetam 1000 MG/100ML Soln Commonly known as: KEPPRA Inject 100 mLs (1,000 mg total) into the vein 2 (two) times  daily.   lisinopril 5 MG tablet Commonly known as: ZESTRIL Take 1 tablet (5 mg total) by mouth daily.   loratadine 10 MG tablet Commonly known as: CLARITIN Take 10 mg by mouth daily.   Melatonin 10 MG Tabs Take 10 mg by mouth at bedtime.   metoprolol succinate 25 MG 24 hr tablet Commonly known as: TOPROL-XL Take 25 mg by mouth daily.   morphine (PF) 2 MG/ML injection Inject 1 mL (2 mg total) into the vein every 2 (two) hours as needed.   naloxone 4 MG/0.1ML Liqd nasal spray kit Commonly known as: NARCAN Place 1 spray into the nose once.   nitroGLYCERIN 0.4 MG SL tablet Commonly known as: NITROSTAT Place 0.4 mg under the tongue every 5 (five) minutes as needed for chest pain.   ondansetron 4 MG disintegrating tablet Commonly known as: ZOFRAN-ODT Take 4 mg by mouth every 8 (eight) hours as needed for nausea or vomiting.   pantoprazole 40 MG tablet Commonly known as: PROTONIX Take 40 mg by mouth in the morning.   polyethylene glycol 17 g packet Commonly known as: MIRALAX / GLYCOLAX Take 34 g by mouth daily.   pregabalin 50 MG capsule Commonly known as: LYRICA Take 50 mg capsule twice daily and 100  mg (two capsules) at bedtime   prochlorperazine 10 MG/2ML injection Commonly known as: COMPAZINE Inject 2 mLs (10 mg total) into the vein every 6 (six) hours as needed.   senna 8.6 MG Tabs tablet Commonly known as: SENOKOT Take 2 tablets (17.2 mg total) by mouth at bedtime as needed for mild constipation or moderate constipation.   simvastatin 20 MG tablet Commonly known as: ZOCOR Take 20 mg by mouth at bedtime.   tamsulosin 0.4 MG Caps capsule Commonly known as: FLOMAX Take 0.4 mg by mouth every evening.   tiotropium 18 MCG inhalation capsule Commonly known as: SPIRIVA Place 18 mcg into inhaler and inhale daily.   trolamine salicylate 10 % cream Commonly known as: ASPERCREME Apply topically 2 (two) times daily as needed for muscle pain.   valproate 450 mg in  dextrose 5 % 50 mL Inject 450 mg into the vein every 6 (six) hours.        Contact information for after-discharge care     Destination     [facility] .   Service: Skilled Nursing Contact information: [address] [facility] [place] [address] [phone]                    Discharge Exam: Filed Weights   07/15/23 0352  Weight: 90 kg   See progress note from 07-28-2023  Condition at discharge: fair  The results of significant diagnostics from this hospitalization (including imaging, microbiology, ancillary and laboratory) are listed below for reference.   Imaging Studies: EEG adult  Result Date: 2023-07-28 Charlsie Quest, MD     2023/07/28 12:35 PM [name] MRN: [id number] Epilepsy Attending: Charlsie Quest Referring Physician/Provider: Jonah Blue, MD Date: 07/28/2023 Duration: 23.58 mins Patient history: 72yo M with seizure like activity getting eeg to evaluate for seizure Level of alertness: Awake AEDs during EEG study: LEV, PGB, Ativan Technical aspects: This EEG study was done with scalp electrodes positioned according to the 10-20 International system of electrode placement. Electrical activity was reviewed with band pass filter of 1-70Hz , sensitivity of 7 uV/mm, display speed of 11mm/sec with a 60Hz  notched filter applied as appropriate. EEG data were recorded continuously and digitally stored.  Video monitoring was available and reviewed as appropriate. Description: At the beginning of the study, EEG showed continuous generalized 3-5hz  theta- delta slowing.  At 1150, patient was noted to have left side face and head jerking. Concomitant EEG showed sharp waves in left parieto-occipital region admixed with 2 to 3 Hz delta slowing which quickly involved right parieto-occipital region.  Subsequently there was significant myogenic artifact making the study difficult to interpret.  Seizure ended at 1152. Second  seizure was noted at 1155 arising from left right occipital region.  No clinical signs were noted during the seizure.  Duration of seizure was about 30 seconds. Third seizure was noted at 1202 arising from left parieto-occipital region during which she was noted to have left-sided twitching.  Duration of seizure was about 45 seconds. Fourth seizure was noted at 1206 as above left right occipital region without any clinical signs.  Duration of seizure was about 30 seconds. IV Ativan was administered at around 1209.  Subsequently EEG showed Continuous generalized amplitude 5 to 8 Hz Decapryn alpha activity admixed with intermittent generalized 2 to 3 Hz delta slowing. Hyperventilation and photic stimulation were not performed.   ABNORMALITY - Focal seizure, left parieto-occipital region - Continuous slow, generalized IMPRESSION: This study  showed focal seizures arising from left parieto-occipital region as described above lasting 30 seconds to 2 minutes each.  Additionally there is moderate diffuse encephalopathy.  Dr. Iver Nestle was notified. Priyanka Annabelle Harman   CT HEAD WO CONTRAST ( )  Result Date: 07/17/2023 CLINICAL DATA:  Mental status change with unknown cause EXAM: CT HEAD WITHOUT CONTRAST TECHNIQUE: Contiguous axial images were obtained from the base of the skull through the vertex without intravenous contrast. RADIATION DOSE REDUCTION: This exam was performed according to the departmental dose-optimization program which includes automated exposure control, adjustment of the mA and/or kV according to patient size and/or use of iterative reconstruction technique. COMPARISON:  06/24/2023 FINDINGS: Brain: No evidence of acute infarction, hemorrhage, hydrocephalus, extra-axial collection or mass lesion/mass effect. Chronic cortically based infarcts scattered along the right cerebral convexity including at the lateral frontal and occipital parietal cortex. The right cerebral hemisphere is more atrophic than the  left, possibly post ischemic or from the patient's history of seizure disorder. Small chronic infarcts in the left cerebellum. Bilateral inferior frontal and right anterior temporal encephalomalacia, usually posttraumatic. Vascular: No hyperdense vessel or unexpected calcification. Skull: Normal. Negative for fracture or focal lesion. Sinuses/Orbits: No acute finding. IMPRESSION: No acute finding. Atrophy and multifocal encephalomalacia as described. Asymmetric right hemispheric atrophy. Electronically Signed   By: Tiburcio Pea M.D.   On: 07/17/2023 11:25   DG ABD ACUTE 2+V W 1V CHEST  Result Date: 07/16/2023 CLINICAL DATA:  Abdomen pain history of colostomy EXAM: DG ABDOMEN ACUTE WITH 1 VIEW CHEST COMPARISON:  CT 07/15/2023, radiograph 06/22/2023, chest x-ray 06/22/2023 FINDINGS: Single view chest demonstrates elevation of the right diaphragm. No acute airspace disease or effusion. Stable cardiomediastinal silhouette. No pneumothorax. Possible chronic AVN type changes involving the left humeral head. Supine and upright views of the abdomen demonstrate no convincing free air beneath the diaphragm. Nonobstructed bowel-gas pattern with some radiopaque material in left colon. Left hip replacement with prominent heterotopic ossification. IMPRESSION: 1. No acute cardiopulmonary disease. 2. Nonobstructed bowel-gas pattern. Electronically Signed   By: Jasmine Pang M.D.   On: 07/16/2023 15:08   CT ABDOMEN PELVIS W CONTRAST  Result Date: 07/15/2023 CLINICAL DATA:  Abdominal pain with nausea and vomiting. History of left lower quadrant stoma. EXAM: CT ABDOMEN AND PELVIS WITH CONTRAST TECHNIQUE: Multidetector CT imaging of the abdomen and pelvis was performed using the standard protocol following bolus administration of intravenous contrast. RADIATION DOSE REDUCTION: This exam was performed according to the departmental dose-optimization program which includes automated exposure control, adjustment of the mA  and/or kV according to patient size and/or use of iterative reconstruction technique. CONTRAST:  80mL OMNIPAQUE IOHEXOL 350 MG/ML SOLN COMPARISON:  06/22/2023 FINDINGS: Lower chest: Dependent atelectasis noted in the lung bases, right greater than left and similar to prior. Hepatobiliary: No suspicious focal abnormality within the liver parenchyma. Layering tiny gallstones evident. No intrahepatic or extrahepatic biliary dilation. Pancreas: No focal mass lesion. No dilatation of the main duct. No intraparenchymal cyst. No peripancreatic edema. Spleen: No splenomegaly. No suspicious focal mass lesion. Adrenals/Urinary Tract: Stable small bilateral adrenal nodules. These are stable since noncontrast CT stone study 09/30/2017 and demonstrated low attenuation on that study consistent with benign adenomas. No followup imaging is recommended. Cortical scarring noted in both kidneys without hydronephrosis. No evidence for hydroureter. The urinary bladder appears normal for the degree of distention. Stomach/Bowel: Stomach is moderately distended, filled with fluid and gas. Duodenum is normally positioned as is the ligament of Treitz. Small bowel loops are diffusely fluid-filled  and distended up to about 3.7 cm diameter. Fluid-filled distension extends distally to the level of the terminal ileum. The appendix is normal. Right colon is fluid-filled but un distended in transverse colon is nondistended. Left abdominal transverse loop colostomy evident with parastomal fat herniation similar to prior. Minimal diverticular disease is seen in the descending colon without diverticulitis. Vascular/Lymphatic: There is mild atherosclerotic calcification of the abdominal aorta without aneurysm. There is no gastrohepatic or hepatoduodenal ligament lymphadenopathy. No retroperitoneal or mesenteric lymphadenopathy. No pelvic sidewall lymphadenopathy. Reproductive: The prostate gland and seminal vesicles are unremarkable. Other: There is  new small volume free fluid in the abdomen (see lateral left abdomen images 49-67 of series 2 and right abdomen on 43/2. Musculoskeletal: Status post left hip replacement. No worrisome lytic or sclerotic osseous abnormality. IMPRESSION: 1. Diffusely fluid-filled and distended small bowel loops up to about 3.7 cm diameter in the jejunal region. There is no discrete transition zone and the distal and terminal ileum nondistended but fluid-filled. Given the presence of new small volume free fluid in the abdomen, underlying component of small bowel obstruction suspected. No small bowel wall thickening or pneumatosis/portal venous gas. Right and transverse colon proximal to the loop colostomy is nondilated. 2. Left abdominal transverse loop colostomy with parastomal fat herniation similar to prior. 3. Cholelithiasis. 4.  Aortic Atherosclerosis (ICD10-I70.0). Electronically Signed   By: Kennith Center M.D.   On: 07/15/2023 05:32   CT HEAD WO CONTRAST ( )  Result Date: 06/24/2023 CLINICAL DATA:  Neuro deficit, acute, stroke suspected. EXAM: CT HEAD WITHOUT CONTRAST TECHNIQUE: Contiguous axial images were obtained from the base of the skull through the vertex without intravenous contrast. RADIATION DOSE REDUCTION: This exam was performed according to the departmental dose-optimization program which includes automated exposure control, adjustment of the mA and/or kV according to patient size and/or use of iterative reconstruction technique. COMPARISON:  CT head without contrast 06/03/2023 FINDINGS: Brain: A remote anterior right frontal lobe infarct is stable. Chronic atrophy and white matter disease is unchanged. Remote infarct of the right occipital lobe is stable. Ex vacuo dilation of the right lateral ventricle is noted both anteriorly and posteriorly. No acute infarct, hemorrhage, or mass lesion is present. Cyst Remote left posterior cerebellar infarcts are present. The brainstem and cerebellum are otherwise  normal. Midline structures are within normal limits. Vascular: Atherosclerotic calcifications are again noted within the cavernous internal carotid arteries bilaterally. No hyperdense vessel is present. Skull: Calvarium is intact. Calvarium is intact. No focal lytic or blastic lesions are present. No significant extracranial soft tissue lesion is present. Sinuses/Orbits: A polyp or mucous retention cyst is present in the right sphenoid sinus. A remote right orbital blowout fracture is again noted. The paranasal sinuses and mastoid air cells are otherwise clear. Globes and orbits are otherwise within normal limits bilaterally. IMPRESSION: 1. No acute intracranial abnormality or significant interval change. 2. Stable remote anterior right frontal lobe infarct. 3. Stable remote right occipital lobe infarct. 4. Remote left posterior cerebellar infarcts. Electronically Signed   By: Marin Roberts M.D.   On: 06/24/2023 15:12   DG Abd Portable 1V-Small Bowel Obstruction Protocol-initial, 8 hr delay  Result Date: 06/22/2023 CLINICAL DATA:  Small bowel obstruction, 8 hour delay EXAM: PORTABLE ABDOMEN - 1 VIEW COMPARISON:  Radiographs and CT earlier today. FINDINGS: Enteric contrast is seen within the ascending colon. Contrast within the left colon was present on prior CT. Persistent but diminishing gaseous small bowel distension centrally. Tip of the enteric tube may be within  the proximal duodenum. IMPRESSION: Enteric contrast in the ascending colon. Persistent bed improving gaseous small bowel distension. Findings are suggestive of partial or resolving small bowel obstruction. Electronically Signed   By: Narda Rutherford M.D.   On: 06/22/2023 21:03   DG Abd Portable 1 View  Result Date: 06/22/2023 CLINICAL DATA:  Advancement of nasogastric tube. EXAM: PORTABLE ABDOMEN - 1 VIEW COMPARISON:  Earlier today FINDINGS: Tip and side port of the enteric tube below the diaphragm in the stomach. Gaseous small bowel  distension again seen. Dense barium in the left colon. IMPRESSION: Tip and side port of the enteric tube below the diaphragm in the stomach. Electronically Signed   By: Narda Rutherford M.D.   On: 06/22/2023 09:18   DG Abd Portable 1V-Small Bowel Protocol-Position Verification  Result Date: 06/22/2023 CLINICAL DATA:  Nasogastric tube placement. EXAM: PORTABLE ABDOMEN - 1 VIEW COMPARISON:  CT earlier today FINDINGS: Tip and side port of the enteric tube below the diaphragm in the stomach. Small bowel dilatation in the upper abdomen. Dense barium in the left colon. IMPRESSION: Tip and side port of the enteric tube below the diaphragm in the stomach. Electronically Signed   By: Narda Rutherford M.D.   On: 06/22/2023 09:17   CT ABDOMEN PELVIS W CONTRAST  Result Date: 06/22/2023 CLINICAL DATA:  Bowel obstruction suspected.  Colostomy patient. EXAM: CT ABDOMEN AND PELVIS WITH CONTRAST TECHNIQUE: Multidetector CT imaging of the abdomen and pelvis was performed using the standard protocol following bolus administration of intravenous contrast. RADIATION DOSE REDUCTION: This exam was performed according to the departmental dose-optimization program which includes automated exposure control, adjustment of the mA and/or kV according to patient size and/or use of iterative reconstruction technique. CONTRAST:  OMNIPAQUE IOHEXOL 300 MG/ML  SOLN COMPARISON:  CTs with IV contrast 06/12/2022 and 05/31/2022 FINDINGS: Lower chest: Lung bases again show scattered linear scarring or atelectasis. Chronic moderate elevation right hemidiaphragm. No focal infiltrate. There is mild cardiomegaly. Scattered calcific plaque LAD coronary artery. Small hiatal hernia. Hepatobiliary: Occasional calcified granulomas in the liver. No mass enhancement. Portal venous gas is present in the parenchyma predominantly in the left-greater-than-right lower lobes. There are tiny stones layering in the proximal gallbladder, without wall  thickening or biliary dilatation. Pancreas: No abnormality. Spleen: No abnormality. Adrenals/Urinary Tract: Stable bilateral adrenal nodules, 2.8 cm on the left and 2 cm on MRI. Stable in size back to a CTA abdomen and pelvis from 09/30/2017. No follow-up imaging is recommended. Patchy cortical scarring in both kidneys is again noted without mass enhancement, urinary stones or obstruction. The bladder is unremarkable for the degree of distention. Stomach/Bowel: Suspect gastric wall pneumatosis posteriorly. The stomach is dilated with fluid, small-bowel obstruction continuing into the jejunum are multiple upper to mid abdominal segments. The transitional segment is probably in the distal 1/3 of the ileum. There is a sharply angulated transitional segment in the left mid abdomen series 2 axial images 42-46 and on coronal reconstruction series 5 images 36-40 with likely etiology of adhesive disease, and collapse of the downstream small bowel segments. There is no appreciable small or large bowel pneumatosis. There is fluid in the ascending and transverse colon and new demonstration of a loop colostomy of the distal transverse colon through the left mid abdominal wall. 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 No overt thickening of the colon is seen. There is dense stool and barium in the descending colon, rectosigmoid surgical anastomosis. Vascular/Lymphatic: Aortic atherosclerosis. No enlarged abdominal or pelvic lymph nodes. Reproductive:  No prostatomegaly. Other: No free fluid, free hemorrhage, free air or focal inflammatory process. There is no incarcerated hernia. Subcutaneous air in the anterior left lower abdomen is probably due to a recent injection. Musculoskeletal: There is streak artifact from again noted left hip arthroplasty. Osteopenia and degenerative changes of the spine. Sclerotic lesion left L4 pedicle and transverse process is chronically seen. IMPRESSION: 1. High-grade small-bowel obstruction with  transition probably in the distal 1/3 of the ileum, with a sharply angulated transitional segment in the left mid abdomen, likely due to adhesive disease. 2. Portal venous gas in the liver, with suspected gastric wall pneumatosis posteriorly. This can be seen with GI tract ischemia. No pneumatosis is seen in the bowel. 3. Fluid in the ascending and transverse colon but no overt thickening of the colon. 4. 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 5. Aortic and coronary artery atherosclerosis. 6. Cholelithiasis. 7. Stable bilateral adrenal nodules. 8. Stable sclerotic lesion left L4 pedicle and transverse process. 9. Subcutaneous air in the anterior left lower abdomen, probably due to a recent injection. 10. Critical Value/emergent results were called by telephone at the time of interpretation on 06/22/2023 at 6:08 am to provider MARK QUALE , who verbally acknowledged these results. Aortic Atherosclerosis (ICD10-I70.0). Electronically Signed   By: Almira Bar M.D.   On: 06/22/2023 06:08   DG Chest Port 1 View  Result Date: 06/22/2023 CLINICAL DATA:  Abdominal pain EXAM: PORTABLE CHEST 1 VIEW COMPARISON:  07/01/2022 FINDINGS: Normal heart size and mediastinal contours. Chronic elevation of the right diaphragm. No acute infiltrate or edema. No effusion or pneumothorax. No acute osseous findings. IMPRESSION: No active disease. Electronically Signed   By: Tiburcio Pea M.D.   On: 06/22/2023 05:04   DG INJECT DIAG/THERA/INC NEEDLE/CATH/PLC EPI/CERV/THOR W/IMG  Result Date: 06/20/2023 CLINICAL DATA:  Cervical radiculopathy. 72 year old man with history of neck pain radiating to the left upper extremity. Cervical spine MRI dated 04/24/2023 demonstrates moderate to severe left neural foraminal narrowing at C5-6 and C6-7. Cervical epidural steroid injection was requested. FLUOROSCOPY: Radiation Exposure Index (as provided by the fluoroscopic device): 1.60 mGy reference air  Kerma. Fluoroscopy time: 26 seconds. PROCEDURE: Informed consent was obtained from the patient after a thorough discussion of the procedural risks, benefits and alternatives. All questions were addressed. A time-out was performed prior to initiation of the procedure. CERVICAL EPIDURAL INJECTION An interlaminar approach was performed on the left at C7-T1. A 20 gauge epidural needle was advanced using loss-of-resistance technique. DIAGNOSTIC EPIDURAL INJECTION Injection of Isovue-M 300 shows a good epidural pattern with spread above and below the level of needle placement, primarily on the left. No vascular opacification is seen. THERAPEUTIC EPIDURAL INJECTION 1.5 ml of Kenalog 40 mixed with 1 ml of 1% Lidocaine and 2 ml of normal saline were then instilled. The procedure was well-tolerated, and the patient was discharged thirty minutes following the injection in good condition. IMPRESSION: Technically successful interlaminar epidural injection on the left at C7-T1. Electronically Signed   By: Orvan Falconer M.D.   On: 06/20/2023 10:36    Microbiology: Results for orders placed or performed during the hospital encounter of 07/15/23  MRSA Next Gen by PCR, Nasal     Status: Abnormal   Collection Time: 07/17/23 12:48 PM   Specimen: Nasal Mucosa; Nasal Swab  Result Value Ref Range Status   MRSA by PCR Next Gen DETECTED (A) NOT DETECTED Final    Comment: RESULT CALLED TO, READ BACK BY  AND VERIFIED WITH: Claudette Stapler 07/17/23 1403 MW (NOTE) The GeneXpert MRSA Assay (FDA approved for NASAL specimens only), is one component of a comprehensive MRSA colonization surveillance program. It is not intended to diagnose MRSA infection nor to guide or monitor treatment for MRSA infections. Test performance is not FDA approved in patients less than 84 years old. Performed at [facility], [address] [place], [place] [address]     Labs: CBC: Recent Labs  Lab 07/15/23 0327 07/16/23 0443  07/17/23 0924  WBC 7.2 4.7 5.8  NEUTROABS 4.9 3.0 4.2  HGB 16.2 13.0 13.0  HCT 52.6* 41.1 40.9  MCV 94.3 92.8 91.1  PLT 255 129* 182   Basic Metabolic Panel: Recent Labs  Lab 07/15/23 0327 07/16/23 0443 07/17/23 0924  NA 135 139 137  K 4.2 4.5 4.2  CL 105 107 99  CO2 21* 21* 25  GLUCOSE 121* 64* 72  BUN 16 10 11   CREATININE 1.84* 1.08 1.14  CALCIUM 9.6 8.7* 9.1  MG 2.1  --  1.5*   Liver Function Tests: Recent Labs  Lab 07/15/23 0327  AST 16  ALT 11  ALKPHOS 75  BILITOT 0.6  PROT 9.2*  ALBUMIN 5.0   CBG: Recent Labs  Lab 07/17/23 1020  GLUCAP 77    Discharge time spent: greater than 30 minutes.  Signed: Jonah Blue, MD [facility] 07/17/2023

## 2023-07-17 NOTE — Progress Notes (Signed)
   07/17/23 1400  Spiritual Encounters  Type of Visit Follow up  Care provided to: Pt and family  Referral source Chaplain assessment  Reason for visit Urgent spiritual support  OnCall Visit Yes  Spiritual Framework  Presenting Themes Meaning/purpose/sources of inspiration;Rituals and practive  Patient Stress Factors Major life changes  Family Stress Factors Major life changes  Interventions  Spiritual Care Interventions Made Established relationship of care and support;Compassionate presence;Reflective listening;Prayer  Intervention Outcomes  Outcomes Connection to spiritual care  Spiritual Care Plan  Spiritual Care Issues Still Outstanding Chaplain will continue to follow   Spoke with patient brother and he asked for prayer. Calvin [name] has a two codes today leaving a step down room and being elevated to ICU and possible transfer to [facility].

## 2023-07-17 NOTE — Progress Notes (Signed)
Noted pt having generalized jerky movements, stops after 30 secs then closes eyes, rapid response called, vs checked are wnl, pt alert and oriented during the event, MD and PA at bedside, noted pt doing the same, new orders obtained for CT and neuro consult. Pt still following commands during this event.

## 2023-07-17 NOTE — Progress Notes (Signed)
Rapid response called while pt on eeg reading, RT called rapid d/t low 02 sats in the 80s, pt actively having generalized seizures, neuro at bedside ordered ativan, 100% non rebreather placed on the pt, o2 sats up to 96%. MD at bedside and ordered for pt to transfer to ICU.

## 2023-07-18 ENCOUNTER — Inpatient Hospital Stay: Payer: Medicare Other

## 2023-07-18 ENCOUNTER — Inpatient Hospital Stay (HOSPITAL_COMMUNITY): Payer: Medicare Other

## 2023-07-18 ENCOUNTER — Ambulatory Visit: Payer: Medicare Other

## 2023-07-18 ENCOUNTER — Inpatient Hospital Stay (HOSPITAL_COMMUNITY)
Admission: AD | Admit: 2023-07-18 | Discharge: 2023-07-24 | DRG: 101 | Disposition: A | Discharge status: Skilled Nursing Facility | Payer: Medicare Other | Source: Other Acute Inpatient Hospital | Attending: Internal Medicine | Admitting: Internal Medicine

## 2023-07-18 DIAGNOSIS — I4892 Unspecified atrial flutter: Secondary | ICD-10-CM | POA: Diagnosis present

## 2023-07-18 DIAGNOSIS — I2510 Atherosclerotic heart disease of native coronary artery without angina pectoris: Secondary | ICD-10-CM | POA: Diagnosis present

## 2023-07-18 DIAGNOSIS — Z79899 Other long term (current) drug therapy: Secondary | ICD-10-CM | POA: Diagnosis not present

## 2023-07-18 DIAGNOSIS — J449 Chronic obstructive pulmonary disease, unspecified: Secondary | ICD-10-CM | POA: Diagnosis present

## 2023-07-18 DIAGNOSIS — E1122 Type 2 diabetes mellitus with diabetic chronic kidney disease: Secondary | ICD-10-CM | POA: Diagnosis present

## 2023-07-18 DIAGNOSIS — Z933 Colostomy status: Secondary | ICD-10-CM

## 2023-07-18 DIAGNOSIS — N1831 Chronic kidney disease, stage 3a: Secondary | ICD-10-CM | POA: Diagnosis present

## 2023-07-18 DIAGNOSIS — Z515 Encounter for palliative care: Secondary | ICD-10-CM | POA: Diagnosis not present

## 2023-07-18 DIAGNOSIS — I9589 Other hypotension: Secondary | ICD-10-CM | POA: Diagnosis not present

## 2023-07-18 DIAGNOSIS — I5032 Chronic diastolic (congestive) heart failure: Secondary | ICD-10-CM | POA: Diagnosis present

## 2023-07-18 DIAGNOSIS — Z87891 Personal history of nicotine dependence: Secondary | ICD-10-CM

## 2023-07-18 DIAGNOSIS — F419 Anxiety disorder, unspecified: Secondary | ICD-10-CM | POA: Diagnosis present

## 2023-07-18 DIAGNOSIS — N179 Acute kidney failure, unspecified: Secondary | ICD-10-CM | POA: Diagnosis present

## 2023-07-18 DIAGNOSIS — F32A Depression, unspecified: Secondary | ICD-10-CM | POA: Diagnosis present

## 2023-07-18 DIAGNOSIS — Z85850 Personal history of malignant neoplasm of thyroid: Secondary | ICD-10-CM

## 2023-07-18 DIAGNOSIS — I69354 Hemiplegia and 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 side: Secondary | ICD-10-CM | POA: Diagnosis not present

## 2023-07-18 DIAGNOSIS — R1310 Dysphagia, unspecified: Secondary | ICD-10-CM | POA: Diagnosis present

## 2023-07-18 DIAGNOSIS — I130 Hypertensive heart and chronic kidney disease with heart failure and stage 1 through stage 4 chronic kidney disease, or unspecified chronic kidney disease: Secondary | ICD-10-CM | POA: Diagnosis present

## 2023-07-18 DIAGNOSIS — K219 Gastro-esophageal reflux disease without esophagitis: Secondary | ICD-10-CM | POA: Diagnosis present

## 2023-07-18 DIAGNOSIS — Z8719 Personal history of other diseases of the digestive system: Secondary | ICD-10-CM

## 2023-07-18 DIAGNOSIS — E860 Dehydration: Secondary | ICD-10-CM | POA: Diagnosis present

## 2023-07-18 DIAGNOSIS — E785 Hyperlipidemia, unspecified: Secondary | ICD-10-CM | POA: Diagnosis present

## 2023-07-18 DIAGNOSIS — E1140 Type 2 diabetes mellitus with diabetic neuropathy, unspecified: Secondary | ICD-10-CM | POA: Diagnosis present

## 2023-07-18 DIAGNOSIS — K5981 Ogilvie syndrome: Secondary | ICD-10-CM | POA: Diagnosis present

## 2023-07-18 DIAGNOSIS — Z711 Person with feared health complaint in whom no diagnosis is made: Secondary | ICD-10-CM | POA: Diagnosis not present

## 2023-07-18 DIAGNOSIS — G40201 Localization-related (focal) (partial) symptomatic epilepsy and epileptic syndromes with complex partial seizures, not intractable, with status epilepticus: Secondary | ICD-10-CM | POA: Diagnosis present

## 2023-07-18 DIAGNOSIS — E876 Hypokalemia: Secondary | ICD-10-CM | POA: Diagnosis not present

## 2023-07-18 DIAGNOSIS — G9389 Other specified disorders of brain: Secondary | ICD-10-CM | POA: Diagnosis present

## 2023-07-18 DIAGNOSIS — Z789 Other specified health status: Secondary | ICD-10-CM | POA: Diagnosis not present

## 2023-07-18 DIAGNOSIS — K567 Ileus, unspecified: Secondary | ICD-10-CM | POA: Diagnosis present

## 2023-07-18 DIAGNOSIS — Z806 Family history of leukemia: Secondary | ICD-10-CM

## 2023-07-18 DIAGNOSIS — K56609 Unspecified intestinal obstruction, unspecified as to partial versus complete obstruction: Secondary | ICD-10-CM | POA: Diagnosis present

## 2023-07-18 DIAGNOSIS — I10 Essential (primary) hypertension: Secondary | ICD-10-CM | POA: Diagnosis present

## 2023-07-18 DIAGNOSIS — G40909 Epilepsy, unspecified, not intractable, without status epilepticus: Secondary | ICD-10-CM

## 2023-07-18 DIAGNOSIS — Z8249 Family history of ischemic heart disease and other diseases of the circulatory system: Secondary | ICD-10-CM

## 2023-07-18 DIAGNOSIS — R1084 Generalized abdominal pain: Secondary | ICD-10-CM | POA: Diagnosis not present

## 2023-07-18 DIAGNOSIS — R569 Unspecified convulsions: Principal | ICD-10-CM

## 2023-07-18 DIAGNOSIS — Z7189 Other specified counseling: Secondary | ICD-10-CM | POA: Diagnosis not present

## 2023-07-18 DIAGNOSIS — I959 Hypotension, unspecified: Secondary | ICD-10-CM | POA: Diagnosis not present

## 2023-07-18 DIAGNOSIS — Z7982 Long term (current) use of aspirin: Secondary | ICD-10-CM

## 2023-07-18 DIAGNOSIS — Z85038 Personal history of other malignant neoplasm of large intestine: Secondary | ICD-10-CM

## 2023-07-18 DIAGNOSIS — I252 Old myocardial infarction: Secondary | ICD-10-CM

## 2023-07-18 LAB — AMMONIA: Ammonia: <10 umol/L (ref 9–35)

## 2023-07-18 LAB — CBC
HCT: 39.2 % (ref 39.0–52.0)
Hemoglobin: 12.5 g/dL — ABNORMAL LOW (ref 13.0–17.0)
MCH: 28.9 pg (ref 26.0–34.0)
MCHC: 31.9 g/dL (ref 30.0–36.0)
MCV: 90.7 fL (ref 80.0–100.0)
Platelets: 179 10*3/uL (ref 150–400)
RBC: 4.32 MIL/uL (ref 4.22–5.81)
RDW: 12.8 % (ref 11.5–15.5)
WBC: 5.3 10*3/uL (ref 4.0–10.5)
nRBC: 0 % (ref 0.0–0.2)

## 2023-07-18 LAB — BASIC METABOLIC PANEL
Anion gap: 11 (ref 5–15)
BUN: 14 mg/dL (ref 8–23)
CO2: 28 mmol/L (ref 22–32)
Calcium: 8.9 mg/dL (ref 8.9–10.3)
Chloride: 98 mmol/L (ref 98–111)
Creatinine, Ser: 1.23 mg/dL (ref 0.61–1.24)
GFR, Estimated: >60 mL/min (ref >60)
Glucose, Bld: 74 mg/dL (ref 70–99)
Potassium: 3.9 mmol/L (ref 3.5–5.1)
Sodium: 137 mmol/L (ref 135–145)

## 2023-07-18 LAB — VALPROIC ACID LEVEL: Valproic Acid Lvl: 51 ug/mL (ref 50.0–100.0)

## 2023-07-18 LAB — MAGNESIUM: Magnesium: 1.8 mg/dL (ref 1.7–2.4)

## 2023-07-18 MED ORDER — METOPROLOL SUCCINATE ER 25 MG PO TB24
25.0000 mg | ORAL_TABLET | Freq: Every day | ORAL | Status: DC
  Administered 2023-07-20 – 2023-07-21 (×2): 25 mg via ORAL
  Filled 2023-07-18 (×2): qty 1

## 2023-07-18 MED ORDER — DEXTROSE-SODIUM CHLORIDE 5-0.45 % IV SOLN: INTRAVENOUS | Status: AC

## 2023-07-18 MED ORDER — SIMVASTATIN 20 MG PO TABS
20.0000 mg | ORAL_TABLET | Freq: Every day | ORAL | Status: DC
  Administered 2023-07-19 – 2023-07-23 (×5): 20 mg via ORAL
  Filled 2023-07-18 (×5): qty 1

## 2023-07-18 MED ORDER — MORPHINE SULFATE (PF) 2 MG/ML IV SOLN
2.0000 mg | INTRAVENOUS | Status: DC | PRN
  Administered 2023-07-18 – 2023-07-21 (×5): 2 mg via INTRAVENOUS
  Filled 2023-07-18 (×5): qty 1

## 2023-07-18 MED ORDER — TIOTROPIUM BROMIDE MONOHYDRATE 18 MCG IN CAPS: 18.0000 ug | ORAL_CAPSULE | Freq: Every day | RESPIRATORY_TRACT | Status: DC

## 2023-07-18 MED ORDER — LEVETIRACETAM IN NACL 1000 MG/100ML IV SOLN
1000.0000 mg | Freq: Two times a day (BID) | INTRAVENOUS | Status: DC
Start: 2023-07-18 — End: 2023-07-20
  Administered 2023-07-18 – 2023-07-20 (×4): 1000 mg via INTRAVENOUS
  Filled 2023-07-18 (×4): qty 100

## 2023-07-18 MED ORDER — LAMOTRIGINE 25 MG PO TABS
50.0000 mg | ORAL_TABLET | Freq: Two times a day (BID) | ORAL | Status: DC
  Administered 2023-07-19 – 2023-07-24 (×10): 50 mg via ORAL
  Filled 2023-07-18 (×10): qty 2

## 2023-07-18 MED ORDER — TAMSULOSIN HCL 0.4 MG PO CAPS
0.4000 mg | ORAL_CAPSULE | Freq: Every evening | ORAL | Status: DC
  Administered 2023-07-20 – 2023-07-23 (×4): 0.4 mg via ORAL
  Filled 2023-07-18 (×4): qty 1

## 2023-07-18 MED ORDER — ORAL CARE MOUTH RINSE: 15.0000 mL | OROMUCOSAL | Status: DC | PRN

## 2023-07-18 MED ORDER — VALPROATE SODIUM 100 MG/ML IV SOLN
20.0000 mg/kg/d | Freq: Four times a day (QID) | INTRAVENOUS | Status: AC
  Administered 2023-07-18 – 2023-07-19 (×2): 450 mg via INTRAVENOUS
  Filled 2023-07-18 (×4): qty 4.5

## 2023-07-18 MED ORDER — CITALOPRAM HYDROBROMIDE 10 MG PO TABS
10.0000 mg | ORAL_TABLET | Freq: Every day | ORAL | Status: DC
  Administered 2023-07-20 – 2023-07-24 (×5): 10 mg via ORAL
  Filled 2023-07-18 (×5): qty 1

## 2023-07-18 MED ORDER — ENOXAPARIN SODIUM 40 MG/0.4ML IJ SOSY
40.0000 mg | PREFILLED_SYRINGE | INTRAMUSCULAR | Status: DC
  Administered 2023-07-19 – 2023-07-24 (×6): 40 mg via SUBCUTANEOUS
  Filled 2023-07-18 (×6): qty 0.4

## 2023-07-18 MED ORDER — LORAZEPAM 2 MG/ML IJ SOLN: 4.0000 mg | Freq: Once | INTRAMUSCULAR | Status: DC | PRN

## 2023-07-18 MED ORDER — ASPIRIN 81 MG PO CHEW
81.0000 mg | CHEWABLE_TABLET | Freq: Every day | ORAL | Status: DC
  Administered 2023-07-20 – 2023-07-24 (×5): 81 mg via ORAL
  Filled 2023-07-18 (×5): qty 1

## 2023-07-18 MED ORDER — UMECLIDINIUM BROMIDE 62.5 MCG/ACT IN AEPB
1.0000 | INHALATION_SPRAY | Freq: Every day | RESPIRATORY_TRACT | Status: DC
  Administered 2023-07-19 – 2023-07-24 (×4): 1 via RESPIRATORY_TRACT
  Filled 2023-07-18: qty 7

## 2023-07-18 MED ORDER — LISINOPRIL 2.5 MG PO TABS
5.0000 mg | ORAL_TABLET | Freq: Every day | ORAL | Status: DC
  Administered 2023-07-20 – 2023-07-21 (×2): 5 mg via ORAL
  Filled 2023-07-18 (×2): qty 2

## 2023-07-18 MED ORDER — LORAZEPAM 2 MG/ML IJ SOLN
2.0000 mg | Freq: Once | INTRAMUSCULAR | Status: AC | PRN
  Administered 2023-07-18: 2 mg via INTRAVENOUS
  Filled 2023-07-18: qty 1

## 2023-07-18 MED ORDER — PREGABALIN 50 MG PO CAPS
50.0000 mg | ORAL_CAPSULE | Freq: Two times a day (BID) | ORAL | Status: DC
  Administered 2023-07-20 – 2023-07-24 (×8): 50 mg via ORAL
  Filled 2023-07-18 (×8): qty 1

## 2023-07-18 MED ORDER — PREGABALIN 100 MG PO CAPS
100.0000 mg | ORAL_CAPSULE | Freq: Every day | ORAL | Status: DC
  Administered 2023-07-19 – 2023-07-23 (×5): 100 mg via ORAL
  Filled 2023-07-18 (×5): qty 1

## 2023-07-18 NOTE — Progress Notes (Addendum)
Progress Note   Patient: Calvin [name] MWN:[id number] DOB: 1951/08/18 DOA: 07/15/2023     3 DOS: the patient was seen and examined on 07/18/2023   Brief hospital course: Calvin [name] is 72yo with h/o Ogilvie's syndrome, colon cancer s/p transverse colectomy, recurrent SBO with prior recent hospitalization 10/18-22, COPD, CAD, HTN, CVA with L hemiparesis, seizure d/o, stage 3a CKD, and HFpEF, SNF resident who presented on 11/10 with recurrent abdominal pain, found to have SBO.  Surgery consulted.  Refused NG tube, being managed conservatively for now.  Due to concern for Seizure activity, Neurology involved who advised long term EEG for which he is being transferred to [facility]. Please see dc summary done by Dr. Ophelia Charter from yesterday. Patient will need neurology evaluation, LTM EEG, speech and swallow evaluation.  MRI brain 07/18/23 - 1. Mildly restricted diffusion posteriorly in the right cerebral hemisphere which spans vascular territories and is favored to reflect recent seizure activity. 2. Multifocal encephalomalacia   Assessment and Plan: Seizure disorder with status epilepticus IV Keppra to replace oral on admission -> restarted PO Keppra Patient with status epilepticus on 11/12 - possibly related to poor absorption in the setting of SBO EEG with focal seizures and secondary generalization with status epilepticus Given 4 mg IV Ativan x 1 and moved to ICU at [facility] for ongoing monitoring Given an additional dose of Keppra 1000 mg IV and changed to 1000 mg BID. Loaded with Depakote per neurology Resume Lamictal, Lyrica when able to take PO Transfer to [facility] for LTM monitoring. MRI brain ordered shows findings of recent seizure activity, multifocal encephalomalacia. Neurology need to be consulted once at [facility].   SBO, recurrent (small bowel obstruction) (HCC) Ogilvie syndrome/History of colon cancer s/p transverse colostomy NG tube declined by patient on admission Surgery is  following, uncertain about SBO vs. Ileus Repeat KUB 07/17/23 looks resolved - no small bowel dilation, air in colon and he is having ostomy output Due to seizure activity he is made NPO. Need speech evaluation.   L temporal pain Patient described episodic stabbing pain in the L temporal area yesterday - now along L posterior head and along L body Urgent ESR/CRP ordered and unremarkable, low suspicion for temporal arteritis.IV Tylenol for pain for now D/w Dr. Wallace Cullens, who is covering for vascular; he reports that >90% of temporal biopsies are negative and he would prefer to monitor clinically   AKI (acute kidney injury)  Creatinine 1.84 up from baseline of 0.9 Secondary to prerenal azotemia from dehydration associated with vomiting Back to baseline today Monitor renal function and avoid nephrotoxins   Chronic diastolic CHF (congestive heart failure)  Clinically euvolemic Hold blood pressure lowering meds in the setting of hypotension   HTN (hypertension) BP has normalized  Restart Toprol XL Resume lisinopril 11/12   CAD (coronary artery disease) No complaints of chest pain Restart ASA, simvastatin once able to tolerate diet.   COPD (chronic obstructive pulmonary disease)  Not acutely exacerbated Continue home inhalers with DuoNebs as needed   Depression with anxiety Hold Celexa until able to tolerate orally   Hemiplegia and 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 side (HCC) No acute issues Chronic pain - continue citalopram, baclofen, pregabalin Continue telemetry for possible Afib.       Out of bed to chair. Incentive spirometry. Nursing supportive care. Fall, aspiration precautions. DVT prophylaxis   Code Status: Full Code.  Subjective: Patient is seen and examined today morning. He is lethargic, able to answer me  in one word, able to follow commands.   Physical Exam: Vitals:   07/18/23 1000 07/18/23 1100 07/18/23 1200 07/18/23 1300   BP: (!) 152/88 (!) 156/77 (!) 145/90 (!) 155/109  Pulse: 77 75 71 79  Resp: 16 12 13 16   Temp:   97.8 F (36.6 C)   TempSrc:   Axillary   SpO2: 97% 99% 96% 98%  Weight:      Height:        General - Elderly African American male, no apparent distress HEENT - PERRLA, EOMI, atraumatic head, non tender sinuses. Lung - Clear, diffuse rales, no rhonchi, wheezes. Heart - S1, S2 heard, no murmurs, rubs, trace pedal edema. Abdomen - Soft, non tender, bowel sounds colostomy intact. Neuro - lethargic, arousable, follows commands, non focal exam. Skin - Warm and dry.  Data Reviewed:      Latest Ref Rng & Units 07/18/2023    3:28 AM 07/17/2023    9:24 AM 07/16/2023    4:43 AM  CBC  WBC 4.0 - 10.5 K/uL 5.3  5.8  4.7   Hemoglobin 13.0 - 17.0 g/dL 29.5  28.4  13.2   Hematocrit 39.0 - 52.0 % 39.2  40.9  41.1   Platelets 150 - 400 K/uL 179  182  129       Latest Ref Rng & Units 07/18/2023    3:28 AM 07/17/2023    9:24 AM 07/16/2023    4:43 AM  BMP  Glucose 70 - 99 mg/dL 74  72  64   BUN 8 - 23 mg/dL 14  11  10    Creatinine 0.61 - 1.24 mg/dL 4.40  1.02  7.25   Sodium 135 - 145 mmol/L 137  137  139   Potassium 3.5 - 5.1 mmol/L 3.9  4.2  4.5   Chloride 98 - 111 mmol/L 98  99  107   CO2 22 - 32 mmol/L 28  25  21    Calcium 8.9 - 10.3 mg/dL 8.9  9.1  8.7    MR BRAIN WO CONTRAST  Result Date: 07/18/2023 CLINICAL DATA:  Seizure. EXAM: MRI HEAD WITHOUT CONTRAST TECHNIQUE: Multiplanar, multiecho pulse sequences of the brain and surrounding structures were obtained without intravenous contrast. COMPARISON:  Head CT 07/17/2023 and MRI 04/24/2023 FINDINGS: The study is motion degraded, including severe motion on the thin-section coronal T2 sequence through the temporal lobes. Brain: There are new confluent regions of mildly restricted diffusion and T2 hyperintensity involving portions of the right parietal, temporal, and occipital lobes as well as right cingulate gyrus. Chronic cortical  infarcts are again noted in the right frontal and right occipital lobes, and there is also unchanged encephalomalacia in the anteroinferior right greater than left frontal lobes and anterior right temporal lobe typically seen with remote trauma. Asymmetric, diffuse right cerebral hemispheric atrophy may be related to the patient's chronic seizure disorder. Chronic there is a background of mild chronic small vessel ischemia in the cerebral white matter bilaterally. Small chronic left cerebellar infarcts are unchanged. Vascular: Chronically occluded distal left vertebral artery. Skull and upper cervical spine: No suspicious marrow lesion. Sinuses/Orbits: Remote right orbital fracture. Polyp or mucous retention cyst in the right sphenoid sinus. Clear mastoid air cells. Other: None. IMPRESSION: 1. Mildly restricted diffusion posteriorly in the right cerebral hemisphere which spans vascular territories and is favored to reflect recent seizure activity. 2. Multifocal encephalomalacia as described above. Electronically Signed   By: Sebastian Ache M.D.   On: 07/18/2023 14:23  DG Pelvis 1-2 Views  Result Date: 07/18/2023 CLINICAL DATA:  [id number] MRI contraindicated due to metal implant [id number] EXAM: PELVIS - 1-2 VIEW COMPARISON:  07/17/2023, 07/15/2023, 06/22/2023 FINDINGS: There is no evidence of pelvic fracture or diastasis. 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 1.2 cm high attenuation structure within the mid pelvis which was noted to be within the sigmoid colon on previous CT. This structure is denser than enteric contrast and could represent an intraluminal metallic foreign body. Atherosclerotic vascular calcifications are present. IMPRESSION: 1. There is a 1.2 cm high attenuation structure within the mid pelvis which was noted to be within the sigmoid colon on previous CT. This structure is denser than enteric contrast and could represent an intraluminal  metallic foreign body. 2. 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 Electronically Signed   By: Duanne Guess D.O.   On: 07/18/2023 13:10   EEG adult  Result Date: 07/18/2023 Charlsie Quest, MD     07/18/2023  1:00 PM atient Name: JOAH DERISE MRN: [id number] Epilepsy Attending: Charlsie Quest Referring Physician/Provider: Gordy Councilman, MD Date: 07/18/2023 Duration: 1 hour 4 mins  Patient history: 72yo M with seizure like activity getting eeg to evaluate for seizure  Level of alertness: Awake  AEDs during EEG study: LEV, VPA, PGB  Technical aspects: This EEG study was done with scalp electrodes positioned according to the 10-20 International system of electrode placement. Electrical activity was reviewed with band pass filter of 1-70Hz , sensitivity of 7 uV/mm, display speed of 15mm/sec with a 60Hz  notched filter applied as appropriate. EEG data were recorded continuously and digitally stored.  Video monitoring was available and reviewed as appropriate.  Description: EEG showed continuous generalized predominantly 5 to 7 Hz theta slowing admixed with intermittent generalized 2 to 3 Hz delta slowing. Hyperventilation and photic stimulation were not performed.    ABNORMALITY - Continuous slow, generalized  IMPRESSION: This study is suggestive of moderate diffuse encephalopathy.  No further seizures or epileptiform discharges were noted.  Dr. Iver Nestle was notified.  DG Abd 1 View  Result Date: 07/17/2023 CLINICAL DATA:  Abdominal pain. EXAM: ABDOMEN - 1 VIEW COMPARISON:  Abdominal x-ray from yesterday. FINDINGS: Interval removal of the enteric tube. Small bowel dilatation appears resolved. Oral contrast in the left colon. No acute osseous abnormality. IMPRESSION: 1. Interval resolution of small bowel obstruction. Electronically Signed   By: Obie Dredge M.D.   On: 07/17/2023 14:50   EEG adult  Result Date: 07/17/2023 Charlsie Quest, MD     07/18/2023 12:58 PM Patient Name: CANE NAEEM MRN: [id number] Epilepsy Attending: Charlsie Quest Referring Physician/Provider: Jonah Blue, MD Date: 07/17/2023 Duration: 23.58 mins Patient history: 72yo M with seizure like activity getting eeg to evaluate for seizure Level of alertness: Awake AEDs during EEG study: LEV, PGB, Ativan Technical aspects: This EEG study was done with scalp electrodes positioned according to the 10-20 International system of electrode placement. Electrical activity was reviewed with band pass filter of 1-70Hz , sensitivity of 7 uV/mm, display speed of 62mm/sec with a 60Hz  notched filter applied as appropriate. EEG data were recorded continuously and digitally stored.  Video monitoring was available and reviewed as appropriate. Description: At the beginning of the study, EEG showed continuous generalized 3-5hz  theta- delta slowing.  At 1150, patient was noted to have left side face and head jerking. Concomitant EEG showed sharp waves in left parieto-occipital  region admixed with 2 to 3 Hz delta slowing which quickly involved right parieto-occipital region.  Subsequently there was significant myogenic artifact making the study difficult to interpret.  Seizure ended at 1152. Second seizure was noted at 1155 arising from left right occipital region.  No clinical signs were noted during the seizure.  Duration of seizure was about 30 seconds. Third seizure was noted at 1202 arising from left parieto-occipital region during which she was noted to have left-sided twitching.  Duration of seizure was about 45 seconds. Fourth seizure was noted at 1206 as above left right occipital region without any clinical signs.  Duration of seizure was about 30 seconds. IV Ativan was administered at around 1209.  Subsequently EEG showed continuous generalized low amplitude 5 to 8 Hz theta- alpha activity admixed with intermittent generalized 2 to 3 Hz delta slowing. Hyperventilation and photic  stimulation were not performed.   ABNORMALITY - Focal seizure, left parieto-occipital region - Continuous slow, generalized IMPRESSION: This study showed 4 focal seizures arising from left parieto-occipital region as described above lasting 30 seconds to 2 minutes each.  Additionally there is moderate diffuse encephalopathy.  Dr. Iver Nestle was notified. Priyanka Annabelle Harman   CT HEAD WO CONTRAST ( )  Result Date: 07/17/2023 CLINICAL DATA:  Mental status change with unknown cause EXAM: CT HEAD WITHOUT CONTRAST TECHNIQUE: Contiguous axial images were obtained from the base of the skull through the vertex without intravenous contrast. RADIATION DOSE REDUCTION: This exam was performed according to the departmental dose-optimization program which includes automated exposure control, adjustment of the mA and/or kV according to patient size and/or use of iterative reconstruction technique. COMPARISON:  06/24/2023 FINDINGS: Brain: No evidence of acute infarction, hemorrhage, hydrocephalus, extra-axial collection or mass lesion/mass effect. Chronic cortically based infarcts scattered along the right cerebral convexity including at the lateral frontal and occipital parietal cortex. The right cerebral hemisphere is more atrophic than the left, possibly post ischemic or from the patient's history of seizure disorder. Small chronic infarcts in the left cerebellum. Bilateral inferior frontal and right anterior temporal encephalomalacia, usually posttraumatic. Vascular: No hyperdense vessel or unexpected calcification. Skull: Normal. Negative for fracture or focal lesion. Sinuses/Orbits: No acute finding. IMPRESSION: No acute finding. Atrophy and multifocal encephalomalacia as described. Asymmetric right hemispheric atrophy. Electronically Signed   By: Tiburcio Pea M.D.   On: 07/17/2023 11:25       Disposition: Status is: Inpatient Remains inpatient appropriate because: Status Epilepticus  Planned Discharge Destination:   transfer to [facility] Please see DC summary from yesterday     MDM level 3- Patient has active seizure requiting long term EEG monitoring. He will need to be transferred to [facility]. He is at high risk for clinical deterioration.  Author: Marcelino Duster, MD 07/18/2023 2:50 PM Secure chat 7am to 7pm For on call review [email or web address].

## 2023-07-18 NOTE — Plan of Care (Signed)
  Problem: Clinical Measurements: Goal: Will remain free from infection Outcome: Progressing Goal: Respiratory complications will improve Outcome: Progressing Goal: Cardiovascular complication will be avoided Outcome: Progressing   Problem: Activity: Goal: Risk for activity intolerance will decrease Outcome: Progressing   Problem: Nutrition: Goal: Adequate nutrition will be maintained Outcome: Progressing   Problem: Coping: Goal: Level of anxiety will decrease Outcome: Progressing   Problem: Elimination: Goal: Will not experience complications related to bowel motility Outcome: Progressing Goal: Will not experience complications related to urinary retention Outcome: Progressing   Problem: Pain Management: Goal: General experience of comfort will improve Outcome: Progressing   Problem: Safety: Goal: Ability to remain free from injury will improve Outcome: Progressing   Problem: Skin Integrity: Goal: Risk for impaired skin integrity will decrease Outcome: Progressing

## 2023-07-18 NOTE — H&P (Signed)
History and Physical    Calvin [name] JXB:[id number] DOB: 13-Jan-1951 DOA: 07/18/2023  PCP: Lucita Ferrara, MD  Patient coming from: [facility]   Chief Complaint: seizure  HPI:  Calvin [name] is [phone] with h/o Ogilvie's syndrome, colon cancer s/p transverse colectomy, recurrent SBO with prior recent hospitalization 10/18-22, COPD, CAD, HTN, CVA with L hemiparesis, seizure d/o, stage 3a CKD, and HFpEF, SNF resident who presented on 11/10 with recurrent abdominal pain, found to have SBO.  Surgery consulted.  Refused NG tube, being managed conservatively for now.  Due to concern for Seizure activity, Neurology involved who advised long term EEG for which he is being transferred to [facility]   Review of Systems: As per HPI otherwise 10 point review of systems negative.    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 side (HCC)    Hypertension    Myocardial infarction (HCC)    Neuromuscular disorder (HCC)    Ogilvie syndrome    Seizures (HCC)    last 6 months ago   Stroke [facility])    multiple  left side weakness   Tremors of nervous system    Unstable angina [facility])     Past Surgical History:  Procedure Laterality Date   CARPAL TUNNEL RELEASE Left 10/19/2015   Procedure: CARPAL TUNNEL RELEASE;  Surgeon: Kennedy Bucker, MD;  Location: [facility] ORS;  Service: Orthopedics;  Laterality: Left;   COLON SURGERY     COLONOSCOPY WITH PROPOFOL N/A 10/26/2021   Procedure: COLONOSCOPY WITH PROPOFOL;  Surgeon: Regis Bill, MD;  Location: [facility] ENDOSCOPY;  Service: Endoscopy;  Laterality: N/A;   COLONOSCOPY WITH PROPOFOL N/A 06/02/2022   Procedure: COLONOSCOPY WITH PROPOFOL;  Surgeon: Regis Bill, MD;   Location: [facility] ENDOSCOPY;  Service: Endoscopy;  Laterality: N/A;   COLONOSCOPY WITH PROPOFOL N/A 06/06/2022   Procedure: COLONOSCOPY WITH PROPOFOL;  Surgeon: Regis Bill, MD;  Location: [facility] ENDOSCOPY;  Service: Endoscopy;  Laterality: N/A;   JOINT REPLACEMENT     left partial hip    THROAT SURGERY  2013   cancer     reports that he has been smoking. He has never used smokeless tobacco. He reports current alcohol use. He reports that he does not use drugs.  No Known Allergies  Family History  Problem Relation Age of Onset   Cancer Mother    Hypertension Father    Leukemia Brother     Prior to Admission medications   Medication Sig Start Date End Date Taking? Authorizing Provider  acetaminophen (TYLENOL) 325 MG tablet Take 650 mg by mouth every 6 (six) hours.    [provider]  alum & mag hydroxide-simeth (MAALOX/MYLANTA) 200-200-20 MG/5ML suspension Take 30 mLs by mouth every 12 (twelve) hours as needed for indigestion or heartburn.    [provider]  aspirin 81 MG chewable tablet Take 81 mg by mouth daily.    [provider]  baclofen (LIORESAL) 5 mg TABS tablet Take 5 mg by mouth 2 (two) times daily.    [provider]  bisacodyl (DULCOLAX) 10 MG suppository Place 1 suppository (10 mg total) rectally daily as  needed for moderate constipation or mild constipation. 06/26/23   Sunnie Nielsen, DO  bisacodyl (DULCOLAX) 5 MG EC tablet Take 5 mg by mouth daily.    [provider]  Chlorhexidine Gluconate Cloth 2 % PADS Apply 6 each topically daily. 07/18/23   Jonah Blue, MD  citalopram (CELEXA) 10 MG tablet Take 10 mg by mouth daily. 06/04/23   [provider]  enoxaparin (LOVENOX) 60 MG/0.6ML injection Inject 0.45 mLs (45 mg total) into the skin daily. 07/18/23   Jonah Blue, MD  fluticasone (FLONASE) 50 MCG/ACT nasal spray Place 2 sprays into both nostrils daily. 05/15/23   [provider]  lamoTRIgine  (LAMICTAL) 25 MG tablet Take 50 mg by mouth 2 (two) times daily.    [provider]  levETIRAcetam (KEPPRA) 1000 MG/100ML SOLN Inject 100 mLs (1,000 mg total) into the vein 2 (two) times daily. 07/17/23   Jonah Blue, MD  lisinopril (ZESTRIL) 5 MG tablet Take 1 tablet (5 mg total) by mouth daily. 08/14/19   Dhungel, Theda Belfast, MD  loratadine (CLARITIN) 10 MG tablet Take 10 mg by mouth daily.    [provider]  Melatonin 10 MG TABS Take 10 mg by mouth at bedtime.    [provider]  metoprolol succinate (TOPROL-XL) 25 MG 24 hr tablet Take 25 mg by mouth daily.    [provider]  Morphine Sulfate (MORPHINE, PF,) 2 MG/ML injection Inject 1 mL (2 mg total) into the vein every 2 (two) hours as needed. 07/17/23   Jonah Blue, MD  naloxone [facility]) nasal spray 4 mg/0.1 mL Place 1 spray into the nose once.    [provider]  nitroGLYCERIN (NITROSTAT) 0.4 MG SL tablet Place 0.4 mg under the tongue every 5 (five) minutes as needed for chest pain.    [provider]  ondansetron (ZOFRAN-ODT) 4 MG disintegrating tablet Take 4 mg by mouth every 8 (eight) hours as needed for nausea or vomiting.    [provider]  pantoprazole (PROTONIX) 40 MG tablet Take 40 mg by mouth in the morning.    [provider]  polyethylene glycol (MIRALAX / GLYCOLAX) 17 g packet Take 34 g by mouth daily. 06/30/22   Lurene Shadow, MD  pregabalin (LYRICA) 50 MG capsule Take 50 mg capsule twice daily and 100 mg (two capsules) at bedtime 06/26/23   Sunnie Nielsen, DO  prochlorperazine (COMPAZINE) 10 MG/2ML injection Inject 2 mLs (10 mg total) into the vein every 6 (six) hours as needed. 07/17/23   Jonah Blue, MD  senna (SENOKOT) 8.6 MG TABS tablet Take 2 tablets (17.2 mg total) by mouth at bedtime as needed for mild constipation or moderate constipation. 06/26/23   Sunnie Nielsen, DO  simvastatin (ZOCOR) 20 MG tablet Take 20 mg by mouth at  bedtime.    [provider]  tamsulosin (FLOMAX) 0.4 MG CAPS capsule Take 0.4 mg by mouth every evening.    [provider]  tiotropium (SPIRIVA) 18 MCG inhalation capsule Place 18 mcg into inhaler and inhale daily.    [provider]  trolamine salicylate (ASPERCREME) 10 % cream Apply topically 2 (two) times daily as needed for muscle pain. 07/17/23   Jonah Blue, MD  valproate 450 mg in dextrose 5 % 50 mL Inject 450 mg into the vein every 6 (six) hours. 07/17/23   Jonah Blue, MD  Wheat Dextrin (BENEFIBER DRINK MIX PO) Take 5 g by mouth daily.    [provider]    Physical Exam:  There were no vitals filed for this visit.  Constitutional: No acute distress Head: Atraumatic Eyes: Conjunctiva clear ENM: Moist mucous membranes.  Neck: Supple Respiratory: Clear to auscultation bilaterally  Cardiovascular: Regular rate and rhythm. Distant heart sounds Abdomen: Non-tender, mildly distended. No rebound or guarding. Positive bowel sounds. Musculoskeletal: No joint deformity upper and lower extremities.   Skin: No visible rashes, lesions, or ulcers.  Extremities: trace LE peripheral edema.   Neurologic: moving all 4, awake, answers simple questions Psychiatric: appears confused   Labs on Admission: I have personally reviewed following labs and imaging studies  CBC: Recent Labs  Lab 07/15/23 0327 07/16/23 0443 07/17/23 0924 07/18/23 0328  WBC 7.2 4.7 5.8 5.3  NEUTROABS 4.9 3.0 4.2  --   HGB 16.2 13.0 13.0 12.5*  HCT 52.6* 41.1 40.9 39.2  MCV 94.3 92.8 91.1 90.7  PLT 255 129* 182 179   Basic Metabolic Panel: Recent Labs  Lab 07/15/23 0327 07/16/23 0443 07/17/23 0924 07/18/23 0328  NA 135 139 137 137  K 4.2 4.5 4.2 3.9  CL 105 107 99 98  CO2 21* 21* 25 28  GLUCOSE 121* 64* 72 74  BUN 16 10 11 14   CREATININE 1.84* 1.08 1.14 1.23  CALCIUM 9.6 8.7* 9.1 8.9  MG 2.1  --  1.5* 1.8   GFR: Estimated Creatinine Clearance: 55.4  mL/min (by C-G formula based on SCr of 1.23 mg/dL). Liver Function Tests: Recent Labs  Lab 07/15/23 0327  AST 16  ALT 11  ALKPHOS 75  BILITOT 0.6  PROT 9.2*  ALBUMIN 5.0   Recent Labs  Lab 07/15/23 0327  LIPASE 37   Recent Labs  Lab 07/18/23 0328  AMMONIA <10   Coagulation Profile: No results for input(s): "INR", "PROTIME" in the last 168 hours. Cardiac Enzymes: Recent Labs  Lab 07/17/23 0924  CKTOTAL 59   BNP (last 3 results) No results for input(s): "PROBNP" in the last 8760 hours. HbA1C: No results for input(s): "HGBA1C" in the last 72 hours. CBG: Recent Labs  Lab 07/17/23 1020 07/17/23 1233  GLUCAP 77 105*   Lipid Profile: No results for input(s): "CHOL", "HDL", "LDLCALC", "TRIG", "CHOLHDL", "LDLDIRECT" in the last 72 hours. Thyroid Function Tests: No results for input(s): "TSH", "T4TOTAL", "FREET4", "T3FREE", "THYROIDAB" in the last 72 hours. Anemia Panel: No results for input(s): "VITAMINB12", "FOLATE", "FERRITIN", "TIBC", "IRON", "RETICCTPCT" in the last 72 hours. Urine analysis:    Component Value Date/Time   COLORURINE STRAW (A) 07/16/2023 0247   APPEARANCEUR CLEAR (A) 07/16/2023 0247   APPEARANCEUR Clear 08/14/2014 0933   LABSPEC 1.019 07/16/2023 0247   LABSPEC 1.015 08/14/2014 0933   PHURINE 5.0 07/16/2023 0247   GLUCOSEU NEGATIVE 07/16/2023 0247   GLUCOSEU Negative 08/14/2014 0933   HGBUR NEGATIVE 07/16/2023 0247   BILIRUBINUR NEGATIVE 07/16/2023 0247   BILIRUBINUR Negative 08/14/2014 0933   KETONESUR NEGATIVE 07/16/2023 0247   PROTEINUR NEGATIVE 07/16/2023 0247   UROBILINOGEN 0.2 06/12/2010 0103   NITRITE NEGATIVE 07/16/2023 0247   LEUKOCYTESUR TRACE (A) 07/16/2023 0247   LEUKOCYTESUR Negative 08/14/2014 0933    Radiological Exams on Admission: MR BRAIN WO CONTRAST  Result Date: 07/18/2023 CLINICAL DATA:  Seizure. EXAM: MRI HEAD WITHOUT CONTRAST TECHNIQUE: Multiplanar, multiecho pulse sequences of the brain and surrounding  structures were obtained without intravenous contrast. COMPARISON:  Head CT 07/17/2023 and MRI 04/24/2023 FINDINGS: The study is motion degraded, including severe motion on the thin-section coronal T2 sequence through the temporal lobes. Brain: There are new confluent regions of mildly  restricted diffusion and T2 hyperintensity involving portions of the right parietal, temporal, and occipital lobes as well as right cingulate gyrus. Chronic cortical infarcts are again noted in the right frontal and right occipital lobes, and there is also unchanged encephalomalacia in the anteroinferior right greater than left frontal lobes and anterior right temporal lobe typically seen with remote trauma. Asymmetric, diffuse right cerebral hemispheric atrophy may be related to the patient's chronic seizure disorder. Chronic there is a background of mild chronic small vessel ischemia in the cerebral white matter bilaterally. Small chronic left cerebellar infarcts are unchanged. Vascular: Chronically occluded distal left vertebral artery. Skull and upper cervical spine: No suspicious marrow lesion. Sinuses/Orbits: Remote right orbital fracture. Polyp or mucous retention cyst in the right sphenoid sinus. Clear mastoid air cells. Other: None. IMPRESSION: 1. Mildly restricted diffusion posteriorly in the right cerebral hemisphere which spans vascular territories and is favored to reflect recent seizure activity. 2. Multifocal encephalomalacia as described above. Electronically Signed   By: Sebastian Ache M.D.   On: 07/18/2023 14:23   DG Pelvis 1-2 Views  Result Date: 07/18/2023 CLINICAL DATA:  [id number] MRI contraindicated due to metal implant [id number] EXAM: PELVIS - 1-2 VIEW COMPARISON:  07/17/2023, 07/15/2023, 06/22/2023 FINDINGS: There is no evidence of pelvic fracture or diastasis. 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 1.2 cm high attenuation structure within the mid pelvis  which was noted to be within the sigmoid colon on previous CT. This structure is denser than enteric contrast and could represent an intraluminal metallic foreign body. Atherosclerotic vascular calcifications are present. IMPRESSION: 1. There is a 1.2 cm high attenuation structure within the mid pelvis which was noted to be within the sigmoid colon on previous CT. This structure is denser than enteric contrast and could represent an intraluminal metallic foreign body. 2. 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 Electronically Signed   By: Duanne Guess D.O.   On: 07/18/2023 13:10   EEG adult  Result Date: 07/18/2023 Charlsie Quest, MD     07/18/2023  1:00 PM atient Name: KAREE FAUSEY MRN: [id number] Epilepsy Attending: Charlsie Quest Referring Physician/Provider: Gordy Councilman, MD Date: 07/18/2023 Duration: 1 hour 4 mins  Patient history: 72yo M with seizure like activity getting eeg to evaluate for seizure  Level of alertness: Awake  AEDs during EEG study: LEV, VPA, PGB  Technical aspects: This EEG study was done with scalp electrodes positioned according to the 10-20 International system of electrode placement. Electrical activity was reviewed with band pass filter of 1-70Hz , sensitivity of 7 uV/mm, display speed of 27mm/sec with a 60Hz  notched filter applied as appropriate. EEG data were recorded continuously and digitally stored.  Video monitoring was available and reviewed as appropriate.  Description: EEG showed continuous generalized predominantly 5 to 7 Hz theta slowing admixed with intermittent generalized 2 to 3 Hz delta slowing. Hyperventilation and photic stimulation were not performed.    ABNORMALITY - Continuous slow, generalized  IMPRESSION: This study is suggestive of moderate diffuse encephalopathy.  No further seizures or epileptiform discharges were noted.  Dr. Iver Nestle was notified.  DG Abd 1 View  Result Date:  07/17/2023 CLINICAL DATA:  Abdominal pain. EXAM: ABDOMEN - 1 VIEW COMPARISON:  Abdominal x-ray from yesterday. FINDINGS: Interval removal of the enteric tube. Small bowel dilatation appears resolved. Oral contrast in the left colon. No acute osseous abnormality. IMPRESSION: 1. Interval resolution of small bowel  obstruction. Electronically Signed   By: Obie Dredge M.D.   On: 07/17/2023 14:50   EEG adult  Result Date: 07/17/2023 Charlsie Quest, MD     07/18/2023 12:58 PM [name] MRN: [id number] Epilepsy Attending: Charlsie Quest Referring Physician/Provider: Jonah Blue, MD Date: 07/17/2023 Duration: 23.58 mins Patient history: 72yo M with seizure like activity getting eeg to evaluate for seizure Level of alertness: Awake AEDs during EEG study: LEV, PGB, Ativan Technical aspects: This EEG study was done with scalp electrodes positioned according to the 10-20 International system of electrode placement. Electrical activity was reviewed with band pass filter of 1-70Hz , sensitivity of 7 uV/mm, display speed of 73mm/sec with a 60Hz  notched filter applied as appropriate. EEG data were recorded continuously and digitally stored.  Video monitoring was available and reviewed as appropriate. Description: At the beginning of the study, EEG showed continuous generalized 3-5hz  theta- delta slowing.  At 1150, patient was noted to have left side face and head jerking. Concomitant EEG showed sharp waves in left parieto-occipital region admixed with 2 to 3 Hz delta slowing which quickly involved right parieto-occipital region.  Subsequently there was significant myogenic artifact making the study difficult to interpret.  Seizure ended at 1152. Second seizure was noted at 1155 arising from left right occipital region.  No clinical signs were noted during the seizure.  Duration of seizure was about 30 seconds. Third seizure was noted at 1202 arising from left parieto-occipital region during which  she was noted to have left-sided twitching.  Duration of seizure was about 45 seconds. Fourth seizure was noted at 1206 as above left right occipital region without any clinical signs.  Duration of seizure was about 30 seconds. IV Ativan was administered at around 1209.  Subsequently EEG showed continuous generalized low amplitude 5 to 8 Hz theta- alpha activity admixed with intermittent generalized 2 to 3 Hz delta slowing. Hyperventilation and photic stimulation were not performed.   ABNORMALITY - Focal seizure, left parieto-occipital region - Continuous slow, generalized IMPRESSION: This study showed 4 focal seizures arising from left parieto-occipital region as described above lasting 30 seconds to 2 minutes each.  Additionally there is moderate diffuse encephalopathy.  Dr. Iver Nestle was notified. Priyanka Annabelle Harman   CT HEAD WO CONTRAST ( )  Result Date: 07/17/2023 CLINICAL DATA:  Mental status change with unknown cause EXAM: CT HEAD WITHOUT CONTRAST TECHNIQUE: Contiguous axial images were obtained from the base of the skull through the vertex without intravenous contrast. RADIATION DOSE REDUCTION: This exam was performed according to the departmental dose-optimization program which includes automated exposure control, adjustment of the mA and/or kV according to patient size and/or use of iterative reconstruction technique. COMPARISON:  06/24/2023 FINDINGS: Brain: No evidence of acute infarction, hemorrhage, hydrocephalus, extra-axial collection or mass lesion/mass effect. Chronic cortically based infarcts scattered along the right cerebral convexity including at the lateral frontal and occipital parietal cortex. The right cerebral hemisphere is more atrophic than the left, possibly post ischemic or from the patient's history of seizure disorder. Small chronic infarcts in the left cerebellum. Bilateral inferior frontal and right anterior temporal encephalomalacia, usually posttraumatic. Vascular: No hyperdense  vessel or unexpected calcification. Skull: Normal. Negative for fracture or focal lesion. Sinuses/Orbits: No acute finding. IMPRESSION: No acute finding. Atrophy and multifocal encephalomalacia as described. Asymmetric right hemispheric atrophy. Electronically Signed   By: Tiburcio Pea M.D.   On: 07/17/2023 11:25     Assessment/Plan Principal Problem:   Seizure [facility] Active Problems:  SBO, recurrent (small bowel obstruction) (HCC)   Ogilvie syndrome   Paroxysmal atrial flutter (HCC)   HTN (hypertension)   CAD (coronary artery disease)   COPD (chronic obstructive pulmonary disease) (HCC)   Ileus (HCC)   Hemiplegia and 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 side (HCC)    Seizure disorder with status epilepticus Focal seizure on EEG yesterday, none seen on EEG today, transferred from [facility] for continuous eeg - neurology to see - continue keppra, valproic acid, pregabalin, lamictal - continuous EEG getting placed now - ativan prn - slp eval - seizure precautions   SBO, recurrent (small bowel obstruction) (HCC) Colostomy status Ogilvie syndrome/History of colon cancer s/p transverse colostomy NG tube declined by patient on admission Surgery followed at [facility], uncertain about SBO vs. Ileus Repeat KUB 07/17/23 looks resolved - no small bowel dilation, air in colon and he is having ostomy output - consider gen surg f/u in the AM to monitor while here - ostomy nurse consult   L temporal pain Per hospitalist at [facility] today, patient described episodic stabbing pain in the L temporal area yesterday - now along L posterior head and along L body Urgent ESR/CRP ordered and unremarkable, low suspicion for temporal arteritis.IV Tylenol for pain for now D/w Dr. Wallace Cullens, who is covering for vascular; he reports that >90% of temporal biopsies are negative and he would prefer to monitor clinically   AKI (acute kidney injury)  Creatinine 1.84 up from baseline of  0.9 Secondary to prerenal azotemia from dehydration associated with vomiting Back to baseline  Monitor renal function and avoid nephrotoxins   Chronic diastolic CHF (congestive heart failure)  Clinically euvolemic Continue home metop   HTN (hypertension) BP elevated - resume metop, lisinopril   CAD (coronary artery disease) No complaints of chest pain Asa/statin when cleared to eat   COPD (chronic obstructive pulmonary disease)  Not acutely exacerbated Continue home inhalers with DuoNebs as needed   Depression with anxiety Home celexa when cleared to eat   Hemiplegia and 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 side (HCC) No acute issues Chronic pain - continue citalopram, baclofen, pregabalin Continue telemetry for possible Afib.    DVT prophylaxis: lovenox Code Status: full  Family Communication: none @ bedside  Consults called: neurology   Level of care: Progressive Status is: Inpatient Remains inpatient appropriate because: severity of illness    Silvano Bilis MD [facility] Pager [phone]  If 7PM-7AM, please contact night-coverage [email or web address] Password [facility]  07/18/2023, 5:43 PM

## 2023-07-18 NOTE — Care Management Important Message (Signed)
Important Message  Patient Details  Name: Calvin [name] MRN: [id number] Date of Birth: 1950/09/27   Important Message Given:  Yes - Medicare IM     Calvin [name] 07/18/2023, 9:39 AM

## 2023-07-18 NOTE — Progress Notes (Signed)
NEUROLOGY CONSULT FOLLOW UP NOTE   Date of service: July 18, 2023 [name] MRN:  [id number] DOB:  February 09, 1951  Brief HPI  Calvin [name] is a 72 y.o. 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   Post stroke epilepsy relevant history Semiology: left sided stiffening/pain/twitching with secondary generalization Aura: Possibly left sided numbness/tingling Frequency: Unclear   Medications used previously Depakote 125 mg twice daily (unclear why stopped) Vimpat 200 mg twice daily (now contraindicated given prolonged PR Gabapentin (changed to lyrica for pain control)  Currently on Keppra 1000 mg twice daily (increased from recent home dose of 750 mg BID) Depakote 450 mg q6hr IV (20 mg/kg/day)   Interval Hx/subjective  -No clinical seizures overnight -Nursing reports was following commands in all 4 extremities, oriented to year, self and place this morning for her -Irritable with me   Vitals   Current vital signs: BP (!) 167/99 (BP Location: Right Arm)   Pulse 77   Temp (!) 97.5 F (36.4 C) (Axillary)   Resp 14   Ht 5\' 6"  (1.676 m)   Wt 90 kg   SpO2 99%   BMI 32.02 kg/m  Vital signs in last 24 hours: Temp:  [97.5 F (36.4 C)-98.6 F (37 C)] 97.5 F (36.4 C) (11/13 0800) Pulse Rate:  [71-88] 77 (11/13 0800) Resp:  [7-20] 14 (11/13 0800) BP: (105-169)/(67-106) 167/99 (11/13 0800) SpO2:  [89 %-100 %] 99 % (11/13 0800)   Body mass index is 32.02 kg/m.  Physical Exam   Constitutional: Appears chronically ill Eyes: No scleral injection.  HENT: No OP obstruction.   Cardiovascular: Normal rate and regular rhythm.  Respiratory: Effort normal, non-labored breathing.  GI: some guardian, no obvious tenderness  Neurologic Examination   Mental Status: Patient is sleeping but awakens easily. Not answering orientation questions. Does report belly pain, will not specify where. Not following commands but has some brief speech "I done told you" "Leave me alone" Cranial Nerves: II: Pupils are pinpoint but reactive bilaterally III,IV, VI: Orients to examiner bilaterally. At one point sustained gaze to the left with regular blinking and poor responsiveness, negative VOR, ~30-60 seconds V: Facial sensation is slightly reduced to eyelash brush on the left compared to the right but brisker than yesterday VII: Facial movement near symmetric today.  VIII: hearing is intact to voice,  Motor: Purposeful with bilateral UE today but still weaker on the left. Trace LLE movement 1/5 to noxious stim, one episode of ?spasticity vs. Tonic posturing in the LLE ~20 seconds Sensory: Less responsive to stimulation on the left compared to the right    Labs and Diagnostic Imaging   CBC:  Recent Labs  Lab 07/16/23 0443 07/17/23 0924 07/18/23 0328  WBC 4.7 5.8 5.3  NEUTROABS 3.0 4.2  --   HGB 13.0 13.0 12.5*  HCT 41.1 40.9 39.2  MCV 92.8 91.1 90.7  PLT 129* 182 179    Basic Metabolic Panel:  Lab Results  Component Value Date   NA 137 07/18/2023   K 3.9 07/18/2023   CO2 28 07/18/2023   GLUCOSE 74 07/18/2023   BUN 14 07/18/2023   CREATININE 1.23 07/18/2023   CALCIUM 8.9 07/18/2023   GFRNONAA >60 07/18/2023  GFRAA 58 (L) 10/12/2019   Lipid Panel:  Lab Results  Component Value Date   LDLCALC 57 05/29/2022   HgbA1c:  Lab Results  Component Value Date   HGBA1C 5.8 (H) 05/28/2022   Urine Drug Screen:     Component Value Date/Time   LABOPIA NEGATIVE 08/14/2014 0933   COCAINSCRNUR NEGATIVE 08/14/2014 0933   LABBENZ NEGATIVE 08/14/2014 0933   AMPHETMU  NEGATIVE 08/14/2014 0933   THCU NEGATIVE 08/14/2014 0933   LABBARB NEGATIVE 08/14/2014 0933    Alcohol Level No results found for: "ETH" INR  Lab Results  Component Value Date   INR 1.2 06/01/2022   APTT  Lab Results  Component Value Date   APTT 35 06/01/2022   AED levels:  Lab Results  Component Value Date   PHENYTOIN 20.8 (H) 06/28/2014   LEVETIRACETA 32.4 05/28/2022   Ammonia < 10 (11/13 AM) CK 59 (11/12 9:30 AM)  CT Head without contrast(Personally reviewed): No acute finding.   Atrophy and multifocal encephalomalacia as described. Asymmetric right hemispheric atrophy.   MRI Brain(Personally reviewed): pending  rEEG 11/12 Description: At the beginning of the study, EEG showed continuous generalized 3-5hz  theta- delta slowing.  At 1150, patient was noted to have left side face and head jerking. Concomitant EEG showed sharp waves in left parieto-occipital region admixed with 2 to 3 Hz delta slowing which quickly involved right parieto-occipital region.  Subsequently there was significant myogenic artifact making the study difficult to interpret.  Seizure ended at 1152. Second seizure was noted at 1155 arising from left right occipital region.  No clinical signs were noted during the seizure.  Duration of seizure was about 30 seconds. Third seizure was noted at 1202 arising from left parieto-occipital region during which she was noted to have left-sided twitching.  Duration of seizure was about 45 seconds. Fourth seizure was noted at 1206 as above left right occipital region without any clinical signs.  Duration of seizure was about 30 seconds. IV Ativan was administered at around 1209.  Subsequently EEG showed Continuous generalized amplitude 5 to 8 Hz Decapryn alpha activity admixed with intermittent generalized 2 to 3 Hz delta slowing. Hyperventilation and photic stimulation were not performed.    ABNORMALITY - Focal seizure, left parieto-occipital region - Continuous  slow, generalized IMPRESSION: This study showed focal seizures arising from left parieto-occipital region as described above lasting 30 seconds to 2 minutes each.  Additionally there is moderate diffuse encephalopathy.   Impression   Focal status in the setting of potentially missed medications at home/poor absorption due to SBO.  Calvin [name] seizure medications appear to have also been reduced as he does not seem to be on chronic benzodiazepines that he used to take before and Depakote has also been stopped which he was started on in September 2023 again when he presented with concern for seizure.  Tolerating Depakote well, however gaze deviation during exam and fluctuating mental status concerning for possible ongoing minor seizures. Delirium would be on the differential (diagnosis of exclusion)   Additionally needs clarification of Calvin [name] diagnosis of atrial fibrillation as this would put him at high risk of stroke given Calvin [name] CHA2DS2-VASc score --and given Depakote can reduce efficacy of Eliquis for example, may affect choice of seizure medication going forward. 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  Remote history of QTc prolongation as well -Will load with depakote 1500 mg x 1 dose followed by 20 mg / kg divided every 6 hours -Depakote level timed order for today at 1 PM -Cannot take home lyrica and lamotrigine at this time, would plan to resuming when able (pending SLP eval as failed bedside swallow) - Transfer to [facility] for LTM EEG if bed available, neurology will follow in consultation. I will continue to follow along here if patient cannot be transferred  - Repeat EEG today, plan for extended study if patient not transferred   # Possible paroxysmal atrial  fibrillation not on anticoagulation - MRI brain pending, X-ray clearance completed this morning, 2 mg ativan for scan - Tele - May need alternative to depakote if he does have Afib confirmed and needs anticogulation ______________________________________________________________________   Thank you for the opportunity to take part in the care of this patient. If you have any further questions, please contact the neurology consultation team on call. Updated oncall schedule is listed on AMION.  SignedBrooke Dare MD-PhD [facility] [phone] [facility] coverage for [facility] is from 8 AM to 4 AM in-house and 4 PM to 8 PM by telephone/video. 8 PM to 8 AM emergent questions or overnight urgent questions should be addressed to Teleneurology On-call or [facility] neurohospitalist; contact information can be found on AMION

## 2023-07-18 NOTE — Progress Notes (Signed)
1:04:56 long eeg done

## 2023-07-18 NOTE — Progress Notes (Addendum)
The patient has arrived to [facility] from [facility]. Dr. Rollene Fare consult note from earlier today has been reviewed.   EEG from yesterday: Focal seizure, left parieto-occipital region; continuous slow, generalized. The study showed 4 focal seizures arising from left parieto-occipital region lasting 30 seconds to 2 minutes each.    EEG from today: Continuous slow, generalized. This study is suggestive of moderate diffuse encephalopathy.  No further seizures or epileptiform discharges were noted.  MRI brain also reviewed. There is extensive cortically-based restricted diffusion in the right cerebral hemisphere that is highly concerning for seizure related changes.   LTM EEG has been ordered.   VPA level has come back at 51, so a supplemental VPA load can be administered if needed based on EEG.   Electronically signed: Dr. Caryl Pina

## 2023-07-18 NOTE — Plan of Care (Signed)
  Problem: Clinical Measurements: Goal: Ability to maintain clinical measurements within normal limits will improve Outcome: Progressing Goal: Will remain free from infection Outcome: Progressing Goal: Respiratory complications will improve Outcome: Progressing Goal: Cardiovascular complication will be avoided Outcome: Progressing   

## 2023-07-18 NOTE — Procedures (Addendum)
atient Name: PARMVIR URE  MRN: [id number]  Epilepsy Attending: Charlsie Quest  Referring Physician/Provider: Gordy Councilman, MD  Date: 07/18/2023 Duration: 1 hour 4 mins   Patient history: 72yo M with seizure like activity getting eeg to evaluate for seizure   Level of alertness: Awake   AEDs during EEG study: LEV, VPA, PGB   Technical aspects: This EEG study was done with scalp electrodes positioned according to the 10-20 International system of electrode placement. Electrical activity was reviewed with band pass filter of 1-70Hz , sensitivity of 7 uV/mm, display speed of 76mm/sec with a 60Hz  notched filter applied as appropriate. EEG data were recorded continuously and digitally stored.  Video monitoring was available and reviewed as appropriate.   Description: EEG showed continuous generalized predominantly 5 to 7 Hz theta slowing admixed with intermittent generalized 2 to 3 Hz delta slowing. Hyperventilation and photic stimulation were not performed.      ABNORMALITY - Continuous slow, generalized   IMPRESSION: This study is suggestive of moderate diffuse encephalopathy.  No further seizures or epileptiform discharges were noted.   Dr. Iver Nestle was notified.

## 2023-07-18 NOTE — Progress Notes (Signed)
LTM EEG hooked up and running - no initial skin breakdown - push button tested - [facility] monitoring.  

## 2023-07-18 NOTE — Plan of Care (Signed)
MRI reviewed, agree with radiology, restricted diffusion highly concerning for seizure related changes. No further seizures on extended EEG today but given MRI findings and clinical exam will still benefit from transfer for long-term EEG monitoring Per nursing, [facility] is enroute; Depakote level has been drawn, 2 PM dose of Depakote will be started prior to transfer

## 2023-07-18 NOTE — Progress Notes (Signed)
Report given to [facility] and RN Dwyane Dee on [address] west.

## 2023-07-19 ENCOUNTER — Encounter (HOSPITAL_COMMUNITY): Payer: Self-pay | Admitting: Obstetrics and Gynecology

## 2023-07-19 ENCOUNTER — Inpatient Hospital Stay (HOSPITAL_COMMUNITY): Payer: Medicare Other

## 2023-07-19 ENCOUNTER — Other Ambulatory Visit: Payer: Self-pay

## 2023-07-19 DIAGNOSIS — R569 Unspecified convulsions: Secondary | ICD-10-CM | POA: Diagnosis not present

## 2023-07-19 LAB — BASIC METABOLIC PANEL
Anion gap: 9 (ref 5–15)
BUN: 9 mg/dL (ref 8–23)
CO2: 26 mmol/L (ref 22–32)
Calcium: 8.7 mg/dL — ABNORMAL LOW (ref 8.9–10.3)
Chloride: 102 mmol/L (ref 98–111)
Creatinine, Ser: 1.13 mg/dL (ref 0.61–1.24)
GFR, Estimated: >60 mL/min (ref >60)
Glucose, Bld: 115 mg/dL — ABNORMAL HIGH (ref 70–99)
Potassium: 3.3 mmol/L — ABNORMAL LOW (ref 3.5–5.1)
Sodium: 137 mmol/L (ref 135–145)

## 2023-07-19 LAB — CBC
HCT: 37.2 % — ABNORMAL LOW (ref 39.0–52.0)
Hemoglobin: 12.1 g/dL — ABNORMAL LOW (ref 13.0–17.0)
MCH: 28.7 pg (ref 26.0–34.0)
MCHC: 32.5 g/dL (ref 30.0–36.0)
MCV: 88.2 fL (ref 80.0–100.0)
Platelets: 148 10*3/uL — ABNORMAL LOW (ref 150–400)
RBC: 4.22 MIL/uL (ref 4.22–5.81)
RDW: 12.7 % (ref 11.5–15.5)
WBC: 4.3 10*3/uL (ref 4.0–10.5)
nRBC: 0 % (ref 0.0–0.2)

## 2023-07-19 MED ORDER — CHLORHEXIDINE GLUCONATE CLOTH 2 % EX PADS
6.0000 | MEDICATED_PAD | Freq: Every day | CUTANEOUS | Status: AC
  Administered 2023-07-20 – 2023-07-24 (×5): 6 via TOPICAL

## 2023-07-19 MED ORDER — BACLOFEN 10 MG PO TABS
5.0000 mg | ORAL_TABLET | Freq: Two times a day (BID) | ORAL | Status: DC
  Administered 2023-07-19 – 2023-07-24 (×10): 5 mg via ORAL
  Filled 2023-07-19 (×10): qty 1

## 2023-07-19 MED ORDER — VALPROATE SODIUM 100 MG/ML IV SOLN
500.0000 mg | Freq: Four times a day (QID) | INTRAVENOUS | Status: DC
  Administered 2023-07-19 – 2023-07-20 (×4): 500 mg via INTRAVENOUS
  Filled 2023-07-19 (×7): qty 5
  Filled 2023-07-19 (×2): qty 500

## 2023-07-19 MED ORDER — MUPIROCIN 2 % EX OINT
1.0000 | TOPICAL_OINTMENT | Freq: Two times a day (BID) | CUTANEOUS | Status: AC
  Administered 2023-07-19 – 2023-07-23 (×10): 1 via NASAL
  Filled 2023-07-19 (×4): qty 22

## 2023-07-19 MED ORDER — SODIUM CHLORIDE 0.9 % IV SOLN
50.0000 mg | Freq: Two times a day (BID) | INTRAVENOUS | Status: DC
  Administered 2023-07-19 – 2023-07-20 (×3): 50 mg via INTRAVENOUS
  Filled 2023-07-19 (×4): qty 5

## 2023-07-19 NOTE — Consult Note (Signed)
[facility] Consult Note  JERRIS BAERWALD [birth year]  [id number].    Requesting MD: Lorin Glass Chief Complaint/Reason for Consult: SBO  HPI:  ZUKO BISSET is a 72 y.o. male with multiple medical issues including CHF, HTN, CAD, COPD, CKD, h/o CVA with L hemiparesis who was transferred to [facility] 07/17/23 from [place] due to concern for seizure activity. 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 Everlene Farrier 06/16/22, as well as colon cancer s/p lap assisted sigmoid colectomy 09/13/2007 by Dr. Bertram Savin. He presented to the ED at [facility] 07/15/23 for evaluation of abdominal distension and vomiting. Surgery was consulted for possible SBO vs ileus. CT at that time showed dilated small bowel with no transition zone. Follow up abdominal film 11/12 shows Nonobstructed bowel-gas pattern and patient had return in bowel function.  Today he complains of some bloating and mild abdominal pain. Main complaint is that he is freezing. Denies n/v. He has not had any colostomy output since arriving yesterday. Xray today pending.   Family History  Problem Relation Age of Onset   Cancer Mother    Hypertension Father    Leukemia Brother     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 side (HCC)    Hypertension    Myocardial infarction (HCC)    Neuromuscular disorder (HCC)    Ogilvie syndrome    Seizures (HCC)    last 6 months ago   Stroke [facility])    multiple  left side weakness   Tremors of nervous system    Unstable angina [facility])     Past Surgical History:  Procedure Laterality Date   CARPAL TUNNEL RELEASE Left 10/19/2015    Procedure: CARPAL TUNNEL RELEASE;  Surgeon: Kennedy Bucker, MD;  Location: [facility] ORS;  Service: Orthopedics;  Laterality: Left;   COLON SURGERY     COLONOSCOPY WITH PROPOFOL N/A 10/26/2021   Procedure: COLONOSCOPY WITH PROPOFOL;  Surgeon: Regis Bill, MD;  Location: [facility] ENDOSCOPY;  Service: Endoscopy;  Laterality: N/A;   COLONOSCOPY WITH PROPOFOL N/A 06/02/2022   Procedure: COLONOSCOPY WITH PROPOFOL;  Surgeon: Regis Bill, MD;  Location: [facility] ENDOSCOPY;  Service: Endoscopy;  Laterality: N/A;   COLONOSCOPY WITH PROPOFOL N/A 06/06/2022   Procedure: COLONOSCOPY WITH PROPOFOL;  Surgeon: Regis Bill, MD;  Location: [facility] ENDOSCOPY;  Service: Endoscopy;  Laterality: N/A;   JOINT REPLACEMENT     left partial hip    THROAT SURGERY  2013   cancer    Social History:  reports that he has quit smoking. His smoking use included cigarettes. He has never used smokeless tobacco. He reports that he does not currently use alcohol. He reports that he does not use drugs.  Allergies: No Known Allergies  Medications Prior to Admission  Medication Sig Dispense Refill   acetaminophen (TYLENOL) 325 MG tablet Take 650 mg by mouth every 6 (six) hours.     alum & mag hydroxide-simeth (MAALOX/MYLANTA) 200-200-20 MG/5ML suspension Take 30 mLs by mouth every 12 (twelve) hours as needed for indigestion or heartburn.     aspirin 81 MG chewable tablet Take  81 mg by mouth daily.     baclofen (LIORESAL) 5 mg TABS tablet Take 5 mg by mouth 2 (two) times daily.     bisacodyl (DULCOLAX) 10 MG suppository Place 1 suppository (10 mg total) rectally daily as needed for moderate constipation or mild constipation.     bisacodyl (DULCOLAX) 5 MG EC tablet Take 5 mg by mouth daily.     Chlorhexidine Gluconate Cloth 2 % PADS Apply 6 each topically daily.     citalopram (CELEXA) 10 MG tablet Take 10 mg by mouth daily.     enoxaparin (LOVENOX) 60 MG/0.6ML injection Inject 0.45 mLs (45 mg total) into the skin daily.      fluticasone (FLONASE) 50 MCG/ACT nasal spray Place 2 sprays into both nostrils daily.     lamoTRIgine (LAMICTAL) 25 MG tablet Take 50 mg by mouth 2 (two) times daily.     levETIRAcetam (KEPPRA) 1000 MG/100ML SOLN Inject 100 mLs (1,000 mg total) into the vein 2 (two) times daily.     lisinopril (ZESTRIL) 5 MG tablet Take 1 tablet (5 mg total) by mouth daily. 30 tablet 0   loratadine (CLARITIN) 10 MG tablet Take 10 mg by mouth daily.     Melatonin 10 MG TABS Take 10 mg by mouth at bedtime.     metoprolol succinate (TOPROL-XL) 25 MG 24 hr tablet Take 25 mg by mouth daily.     Morphine Sulfate (MORPHINE, PF,) 2 MG/ML injection Inject 1 mL (2 mg total) into the vein every 2 (two) hours as needed.     naloxone (NARCAN) nasal spray 4 mg/0.1 mL Place 1 spray into the nose once.     nitroGLYCERIN (NITROSTAT) 0.4 MG SL tablet Place 0.4 mg under the tongue every 5 (five) minutes as needed for chest pain.     ondansetron (ZOFRAN-ODT) 4 MG disintegrating tablet Take 4 mg by mouth every 8 (eight) hours as needed for nausea or vomiting.     pantoprazole (PROTONIX) 40 MG tablet Take 40 mg by mouth in the morning.     polyethylene glycol (MIRALAX / GLYCOLAX) 17 g packet Take 34 g by mouth daily.     pregabalin (LYRICA) 50 MG capsule Take 50 mg capsule twice daily and 100 mg (two capsules) at bedtime 30 capsule 0   prochlorperazine (COMPAZINE) 10 MG/2ML injection Inject 2 mLs (10 mg total) into the vein every 6 (six) hours as needed.     senna (SENOKOT) 8.6 MG TABS tablet Take 2 tablets (17.2 mg total) by mouth at bedtime as needed for mild constipation or moderate constipation.     simvastatin (ZOCOR) 20 MG tablet Take 20 mg by mouth at bedtime.     tamsulosin (FLOMAX) 0.4 MG CAPS capsule Take 0.4 mg by mouth every evening.     tiotropium (SPIRIVA) 18 MCG inhalation capsule Place 18 mcg into inhaler and inhale daily.     trolamine salicylate (ASPERCREME) 10 % cream Apply topically 2 (two) times daily as needed for  muscle pain.     valproate 450 mg in dextrose 5 % 50 mL Inject 450 mg into the vein every 6 (six) hours.     Wheat Dextrin (BENEFIBER DRINK MIX PO) Take 5 g by mouth daily.      Prior to Admission medications   Medication Sig Start Date End Date Taking? Authorizing Provider  acetaminophen (TYLENOL) 325 MG tablet Take 650 mg by mouth every 6 (six) hours.    [provider]  alum & Jodelle Green  hydroxide-simeth (MAALOX/MYLANTA) 200-200-20 MG/5ML suspension Take 30 mLs by mouth every 12 (twelve) hours as needed for indigestion or heartburn.    [provider]  aspirin 81 MG chewable tablet Take 81 mg by mouth daily.    [provider]  baclofen (LIORESAL) 5 mg TABS tablet Take 5 mg by mouth 2 (two) times daily.    [provider]  bisacodyl (DULCOLAX) 10 MG suppository Place 1 suppository (10 mg total) rectally daily as needed for moderate constipation or mild constipation. 06/26/23   Sunnie Nielsen, DO  bisacodyl (DULCOLAX) 5 MG EC tablet Take 5 mg by mouth daily.    [provider]  Chlorhexidine Gluconate Cloth 2 % PADS Apply 6 each topically daily. 07/18/23   Jonah Blue, MD  citalopram (CELEXA) 10 MG tablet Take 10 mg by mouth daily. 06/04/23   [provider]  enoxaparin (LOVENOX) 60 MG/0.6ML injection Inject 0.45 mLs (45 mg total) into the skin daily. 07/18/23   Jonah Blue, MD  fluticasone (FLONASE) 50 MCG/ACT nasal spray Place 2 sprays into both nostrils daily. 05/15/23   [provider]  lamoTRIgine (LAMICTAL) 25 MG tablet Take 50 mg by mouth 2 (two) times daily.    [provider]  levETIRAcetam (KEPPRA) 1000 MG/100ML SOLN Inject 100 mLs (1,000 mg total) into the vein 2 (two) times daily. 07/17/23   Jonah Blue, MD  lisinopril (ZESTRIL) 5 MG tablet Take 1 tablet (5 mg total) by mouth daily. 08/14/19   Dhungel, Theda Belfast, MD  loratadine (CLARITIN) 10 MG tablet Take 10 mg by mouth daily.    [provider]   Melatonin 10 MG TABS Take 10 mg by mouth at bedtime.    [provider]  metoprolol succinate (TOPROL-XL) 25 MG 24 hr tablet Take 25 mg by mouth daily.    [provider]  Morphine Sulfate (MORPHINE, PF,) 2 MG/ML injection Inject 1 mL (2 mg total) into the vein every 2 (two) hours as needed. 07/17/23   Jonah Blue, MD  naloxone [facility]) nasal spray 4 mg/0.1 mL Place 1 spray into the nose once.    [provider]  nitroGLYCERIN (NITROSTAT) 0.4 MG SL tablet Place 0.4 mg under the tongue every 5 (five) minutes as needed for chest pain.    [provider]  ondansetron (ZOFRAN-ODT) 4 MG disintegrating tablet Take 4 mg by mouth every 8 (eight) hours as needed for nausea or vomiting.    [provider]  pantoprazole (PROTONIX) 40 MG tablet Take 40 mg by mouth in the morning.    [provider]  polyethylene glycol (MIRALAX / GLYCOLAX) 17 g packet Take 34 g by mouth daily. 06/30/22   Lurene Shadow, MD  pregabalin (LYRICA) 50 MG capsule Take 50 mg capsule twice daily and 100 mg (two capsules) at bedtime 06/26/23   Sunnie Nielsen, DO  prochlorperazine (COMPAZINE) 10 MG/2ML injection Inject 2 mLs (10 mg total) into the vein every 6 (six) hours as needed. 07/17/23   Jonah Blue, MD  senna (SENOKOT) 8.6 MG TABS tablet Take 2 tablets (17.2 mg total) by mouth at bedtime as needed for mild constipation or moderate constipation. 06/26/23   Sunnie Nielsen, DO  simvastatin (ZOCOR) 20 MG tablet Take 20 mg by mouth at bedtime.    [provider]  tamsulosin (FLOMAX) 0.4 MG CAPS capsule Take 0.4 mg by mouth every evening.    [provider]  tiotropium (SPIRIVA) 18 MCG inhalation capsule Place 18 mcg into inhaler and inhale daily.  [provider]  trolamine salicylate (ASPERCREME) 10 % cream Apply topically 2 (two) times daily as needed for muscle pain. 07/17/23   Jonah Blue, MD  valproate 450 mg in dextrose 5 % 50  mL Inject 450 mg into the vein every 6 (six) hours. 07/17/23   Jonah Blue, MD  Wheat Dextrin (BENEFIBER DRINK MIX PO) Take 5 g by mouth daily.    [provider]    Blood pressure (!) 166/87, pulse 77, temperature 98.4 F (36.9 C), temperature source Oral, resp. rate 18, height 5\' 11"  (1.803 m), weight 90 kg, SpO2 94%. Physical Exam: General: chronically ill appearing male who is laying in bed in NAD HEENT: head is normocephalic, atraumatic.  Sclera are noninjected.  Pupils equal and round.  Ears and nose without any masses or lesions.  Mouth is pink and moist. Dentition fair. Undergoing EEG Heart: regular, rate, and rhythm Lungs: CTAB, no wheezes, rhonchi, or rales noted.  Respiratory effort nonlabored on room air Abd: soft, minimal distension, bowel sounds present, Loop colostomy in left abdomen pink with empty bag  Results for orders placed or performed during the hospital encounter of 07/18/23 (from the past 48 hour(s))  Basic metabolic panel     Status: Abnormal   Collection Time: 07/19/23  8:08 AM  Result Value Ref Range   Sodium 137 135 - 145 mmol/L   Potassium 3.3 (L) 3.5 - 5.1 mmol/L   Chloride 102 98 - 111 mmol/L   CO2 26 22 - 32 mmol/L   Glucose, Bld 115 (H) 70 - 99 mg/dL    Comment: Glucose reference range applies only to samples taken after fasting for at least 8 hours.   BUN 9 8 - 23 mg/dL   Creatinine, Ser 1.61 0.61 - 1.24 mg/dL   Calcium 8.7 (L) 8.9 - 10.3 mg/dL   GFR, Estimated >09 >60 mL/min    Comment: (NOTE) Calculated using the CKD-EPI Creatinine Equation (2021)    Anion gap 9 5 - 15    Comment: Performed at [facility], [address] [place], [place] [address]  CBC     Status: Abnormal   Collection Time: 07/19/23  8:08 AM  Result Value Ref Range   WBC 4.3 4.0 - 10.5 K/uL   RBC 4.22 4.22 - 5.81 MIL/uL   Hemoglobin 12.1 (L) 13.0 - 17.0 g/dL   HCT 81.1 (L) 91.4 - 78.2 %   MCV 88.2 80.0 - 100.0 fL   MCH 28.7 26.0 - 34.0 pg   MCHC 32.5  30.0 - 36.0 g/dL   RDW 95.6 21.3 - 08.6 %   Platelets 148 (L) 150 - 400 K/uL   nRBC 0.0 0.0 - 0.2 %    Comment: Performed at [facility], [address] [place], [place] [address]   Overnight EEG with video  Result Date: 07/19/2023 Charlsie Quest, MD     07/19/2023  9:36 AM Patient Name: TYMAINE COELLO MRN: [id number] Epilepsy Attending: Charlsie Quest Referring Physician/Provider: Caryl Pina, MD Duration: 07/18/2023 1739 to 07/19/2023 0935  Patient history: 72yo M with seizure like activity getting eeg to evaluate for seizure  Level of alertness: Awake, asleep  AEDs during EEG study: LEV, VPA, PGB  Technical aspects: This EEG study was done with scalp electrodes positioned according to the 10-20 International system of electrode placement. Electrical activity was reviewed with band pass filter of 1-70Hz , sensitivity of 7 uV/mm, display speed of 65mm/sec with a 60Hz  notched filter  applied as appropriate. EEG data were recorded continuously and digitally stored.  Video monitoring was available and reviewed as appropriate.  Description: The posterior dominant rhythm consists of 6-7 Hz activity of moderate voltage (25-35 uV) seen predominantly in posterior head regions, symmetric and reactive to eye opening and eye closing. Sleep was characterized by vertex waves, sleep spindles (12 to 14 Hz), maximal frontocentral region.  EEG showed continuous generalized predominantly 5 to 7 Hz theta slowing admixed with intermittent generalized 2 to 3 Hz delta slowing. Hyperventilation and photic stimulation were not performed. On 07/19/2023 at 0637, 0639, 0641 and 0658, EEG showed spike and wave activity in left more than right occipital region which lasted for about 20 seconds each time without definite evolution.  Clinically, patient was noted to be laying in bed with eyes closed except at 0658 when he appeared to be mumbling something.  Patient does have history of seizures arising from this location but  without clinical signs and definite evolution, these events are most likely interictal in nature.  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  On 07/19/2023 after 0637, spikes were noted more frequently without definite evolution.  This EEG pattern was on the ictal-interictal continuum with increased risk of seizures.  Additionally EEG suggestive of moderate diffuse encephalopathy.  Charlsie Quest   MR BRAIN WO CONTRAST  Result Date: 07/18/2023 CLINICAL DATA:  Seizure. EXAM: MRI HEAD WITHOUT CONTRAST TECHNIQUE: Multiplanar, multiecho pulse sequences of the brain and surrounding structures were obtained without intravenous contrast. COMPARISON:  Head CT 07/17/2023 and MRI 04/24/2023 FINDINGS: The study is motion degraded, including severe motion on the thin-section coronal T2 sequence through the temporal lobes. Brain: There are new confluent regions of mildly restricted diffusion and T2 hyperintensity involving portions of the right parietal, temporal, and occipital lobes as well as right cingulate gyrus. Chronic cortical infarcts are again noted in the right frontal and right occipital lobes, and there is also unchanged encephalomalacia in the anteroinferior right greater than left frontal lobes and anterior right temporal lobe typically seen with remote trauma. Asymmetric, diffuse right cerebral hemispheric atrophy may be related to the patient's chronic seizure disorder. Chronic there is a background of mild chronic small vessel ischemia in the cerebral white matter bilaterally. Small chronic left cerebellar infarcts are unchanged. Vascular: Chronically occluded distal left vertebral artery. Skull and upper cervical spine: No suspicious marrow lesion. Sinuses/Orbits: Remote right orbital fracture. Polyp or mucous retention cyst in the right  sphenoid sinus. Clear mastoid air cells. Other: None. IMPRESSION: 1. Mildly restricted diffusion posteriorly in the right cerebral hemisphere which spans vascular territories and is favored to reflect recent seizure activity. 2. Multifocal encephalomalacia as described above. Electronically Signed   By: Sebastian Ache M.D.   On: 07/18/2023 14:23   DG Pelvis 1-2 Views  Result Date: 07/18/2023 CLINICAL DATA:  [id number] MRI contraindicated due to metal implant [id number] EXAM: PELVIS - 1-2 VIEW COMPARISON:  07/17/2023, 07/15/2023, 06/22/2023 FINDINGS: There is no evidence of pelvic fracture or diastasis. 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 1.2 cm high attenuation structure within the mid pelvis which was noted to be within the sigmoid colon on previous CT. This structure is denser than enteric contrast and could represent an intraluminal metallic foreign body. Atherosclerotic vascular calcifications are present. IMPRESSION: 1. There is a 1.2 cm high  attenuation structure within the mid pelvis which was noted to be within the sigmoid colon on previous CT. This structure is denser than enteric contrast and could represent an intraluminal metallic foreign body. 2. 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 Electronically Signed   By: Duanne Guess D.O.   On: 07/18/2023 13:10   EEG adult  Result Date: 07/18/2023 Charlsie Quest, MD     07/18/2023  1:00 PM atient Name: AZZAM ELSAESSER MRN: [id number] Epilepsy Attending: Charlsie Quest Referring Physician/Provider: Gordy Councilman, MD Date: 07/18/2023 Duration: 1 hour 4 mins  Patient history: 72yo M with seizure like activity getting eeg to evaluate for seizure  Level of alertness: Awake  AEDs during EEG study: LEV, VPA, PGB  Technical aspects: This EEG study was done with scalp electrodes positioned according to the 10-20 International system of  electrode placement. Electrical activity was reviewed with band pass filter of 1-70Hz , sensitivity of 7 uV/mm, display speed of 68mm/sec with a 60Hz  notched filter applied as appropriate. EEG data were recorded continuously and digitally stored.  Video monitoring was available and reviewed as appropriate.  Description: EEG showed continuous generalized predominantly 5 to 7 Hz theta slowing admixed with intermittent generalized 2 to 3 Hz delta slowing. Hyperventilation and photic stimulation were not performed.    ABNORMALITY - Continuous slow, generalized  IMPRESSION: This study is suggestive of moderate diffuse encephalopathy.  No further seizures or epileptiform discharges were noted.  Dr. Iver Nestle was notified.  EEG adult  Result Date: 07/17/2023 Charlsie Quest, MD     07/18/2023 12:58 PM Patient Name: ADEDAYO STOESSEL MRN: [id number] Epilepsy Attending: Charlsie Quest Referring Physician/Provider: Jonah Blue, MD Date: 07/17/2023 Duration: 23.58 mins Patient history: 72yo M with seizure like activity getting eeg to evaluate for seizure Level of alertness: Awake AEDs during EEG study: LEV, PGB, Ativan Technical aspects: This EEG study was done with scalp electrodes positioned according to the 10-20 International system of electrode placement. Electrical activity was reviewed with band pass filter of 1-70Hz , sensitivity of 7 uV/mm, display speed of 37mm/sec with a 60Hz  notched filter applied as appropriate. EEG data were recorded continuously and digitally stored.  Video monitoring was available and reviewed as appropriate. Description: At the beginning of the study, EEG showed continuous generalized 3-5hz  theta- delta slowing.  At 1150, patient was noted to have left side face and head jerking. Concomitant EEG showed sharp waves in left parieto-occipital region admixed with 2 to 3 Hz delta slowing which quickly involved right parieto-occipital region.  Subsequently there was significant myogenic artifact  making the study difficult to interpret.  Seizure ended at 1152. Second seizure was noted at 1155 arising from left right occipital region.  No clinical signs were noted during the seizure.  Duration of seizure was about 30 seconds. Third seizure was noted at 1202 arising from left parieto-occipital region during which she was noted to have left-sided twitching.  Duration of seizure was about 45 seconds. Fourth seizure was noted at 1206 as above left right occipital region without any clinical signs.  Duration of seizure was about 30 seconds. IV Ativan was administered at around 1209.  Subsequently EEG showed continuous generalized low amplitude 5 to 8 Hz theta- alpha activity admixed with intermittent generalized 2 to 3 Hz delta slowing. Hyperventilation and photic stimulation were not performed.   ABNORMALITY - Focal seizure, left parieto-occipital region - Continuous slow, generalized IMPRESSION: This study showed 4 focal  seizures arising from left parieto-occipital region as described above lasting 30 seconds to 2 minutes each.  Additionally there is moderate diffuse encephalopathy.  Dr. Iver Nestle was notified. Priyanka Annabelle Harman    Anti-infectives (From admission, onward)    None        Assessment/Plan SBO vs ileus - Patient was transferred here for possible seizure work up, we are asked to see regarding his possible SBO. CT 11/10 showed dilated small bowel with no transition zone more suspicious for ileus rather than true SBO. Patient improved radiographically and clinically at outside hospital with return in bowel function but this has slowed down since presenting to [facility]. However, he has had no recurrent n/v and abdominal exam is fairly benign. Xray today pending but he does have air in his colon and small bowel appears fairly unremarkable. Ok to trial clear liquids from our standpoint if cleared by SLP for diet.  ID - none VTE - lovenox FEN - NPO Foley - none  H/o recurrent/recalcitrant  Ogilvie's syndrome s/p Robotic assisted Laparoscopic loop transverse colostomy by Dr. Everlene Farrier 06/16/22  ?Seizure - undergoing work up CHF HTN CAD COPD CKD H/o CVA with L hemiparesis  I reviewed hospitalist notes, last 24 h vitals and pain scores, last 48 h intake and output, last 24 h labs and trends, and last 24 h imaging results.   Franne Forts, PA-C [facility] 07/19/2023, 12:13 PM Please see Amion for pager number during day hours 7:00am-4:30pm

## 2023-07-19 NOTE — Progress Notes (Signed)
PROGRESS NOTE  Calvin [name]  DOB: [birth year]  PCP: Lucita Ferrara, MD ZOX:[id number]  DOA: 07/18/2023  LOS: 1 day  Hospital Day: 2  Brief narrative: Calvin [name] is a 72 y.o. male who is a long-term resident at [facility] care facility at [place],  PMH significant for HTN, CAD, diastolic CHF, CKD, CVA with left hemiparesis, seizure disorder, COPD, h/o Ogilvie's syndrome, colon cancer s/p transverse colectomy, recurrent SBO with prior recent hospitalization 10/18-22, COPD 11/10, patient presented to ED at [facility] with abdominal pain CT abdomen showed diffusely fluid-filled and distended small bowel loops up to about 3.7 cm diameter in the jejunal region with no discrete transition zone.  SBO was suspected. General surgery was consulted.  Patient refused NG tube. Started on conservative management Patient was admitted to [facility]. Repeat x-ray KUB on 11/12 showed interval resolution of small bowel obstruction  However, On the morning of 11/12, patient was noted to have a seizure-like activity.  Per report, patient had generalized jerky movements that lasted for about 30 seconds, less focused eyes, somewhat sleepy afterwards but still speaking and communicating throughout the event.  CT head did not show any acute abnormality but showed multifocal encephalomalacia and asymmetric right hemispheric atrophy. Seen by neurology  EEG was obtained which showed focal seizure in the left parietal occipital region. MRI brain showed mildly restricted diffusion posteriorly in the right cerebral hemisphere which spans vascular territories and is favored to reflect recent seizure activity. 11/13, patient was transferred to was called for long-term EEG.  Subjective: Patient was seen and examined this morning.  Pleasant elderly African-American male. Since his admission here at [facility] last night, no fever, heart rate in 70s and 80s, blood pressure in 150s and 160s, breathing on room air Last set of labs  from yesterday morning with unremarkable CBC, BMP  Assessment and plan: Complex partial seizure  H/o seizure disorder 11/12, patient had partial seizure while at [facility].  EEG showed focal seizure in the left parietal-occipital region CT and MRI as above Transferred to [facility] for long-term EEG. Neurology consulted Currently on Keppra, valproate, Lyrica, Lamictal Seizure precautions  SBO  h/o Ogilvie's syndrome, recurrent SBO H/o colon cancer s/p transverse colectomy, colostomy status Recently hospitalized 10/18-22 for SBO and managed with conservative approach. Admitted again with SBO. Was seen by general surgery at [facility].  Refused NG tube. Managed with conservative approach. 11/12, repeat KUB showed interval resolution of small bowel obstruction  ??? Foreign body inside sigmoid colon 11/13, x-ray pelvis showed a 1.2 cm high attenuation structure within the mid pelvis which was noted to be within the sigmoid colon on previous CT. This structure is denser than enteric contrast and could represent an intraluminal metallic foreign body. Symptoms -hard to get any history from him.  Grimaces a little bit on abdominal palpation.  No output on colostomy. I ordered for stat abdominal x-ray. General surgery consulted.  AKI on CKD 2 Creatinine normal at baseline.  Present with creatinine elevated 1.84.  Improved with hydration. Continue to monitor Recent Labs    02/26/23 0143 06/03/23 1208 06/22/23 0409 06/23/23 0513 06/26/23 0444 07/15/23 0327 07/16/23 0443 07/17/23 0924 07/18/23 0328 07/19/23 0808  BUN 10 9 26* 22 11 16 10 11 14 9   CREATININE 1.36* 1.17 1.42* 1.21 0.90 1.84* 1.08 1.14 1.23 1.13   L temporal pain Per hospitalist at [facility], patient described episodic stabbing pain in the L temporal area on 11/12- now along L posterior head and along L body  Urgent ESR/CRP were obtained and were unremarkable, low suspicion for temporal arteritis. IV Tylenol f given for pain control.   Hospitalist at [facility] discussed this with vascular surgeon Dr. Wallace Cullens, who reported that >90% of temporal biopsies are negative and he would prefer to monitor clinically. Continue to monitor.  Chronic diastolic CHF Hypertension Blood pressure in 150s and 160s. PTA meds- metoprolol, lisinopril Currently continuing both.  CAD, HLD  No anginal symptoms currently. PTA meds- aspirin, statin.  Continue both.  H/o right CVA with residual left hemiparesis Anxiety/depression Continue aspirin and statin Supportive care. PTA meds- Celexa, baclofen, Lyrica.  Continue same.  COPD Respiratory status stable.  Continue as needed bronchodilators   Mobility: Seems bedbound, chronically??  Goals of care   Code Status: Full Code.  Palliative care consulted.    DVT prophylaxis:  enoxaparin (LOVENOX) injection 40 mg Start: 07/19/23 1000   Antimicrobials: None Fluid: Currently on IV D5 half NS at 100 mL/h Consultants: General Surgery Family Communication: None at bedside  Status: Inpatient Level of care:  Progressive   Patient is from: Long-term nursing facility Needs to continue in-hospital care: Undergoing EEG.  SBO eval Anticipated d/c to: Hopefully back to the same facility, unclear when    Diet:  Diet Order             Diet NPO time specified  Diet effective now                   Scheduled Meds:  aspirin  81 mg Oral Daily   baclofen  5 mg Oral BID   [START ON 07/20/2023] Chlorhexidine Gluconate Cloth  6 each Topical Q0600   citalopram  10 mg Oral Daily   enoxaparin (LOVENOX) injection  40 mg Subcutaneous Q24H   lamoTRIgine  50 mg Oral BID   lisinopril  5 mg Oral Daily   metoprolol succinate  25 mg Oral Daily   mupirocin ointment  1 Application Nasal BID   pregabalin  100 mg Oral QHS   pregabalin  50 mg Oral BID WC   simvastatin  20 mg Oral QHS   tamsulosin  0.4 mg Oral QPM   umeclidinium bromide  1 puff Inhalation Daily    PRN meds: LORazepam, morphine (PF)    Infusions:   dextrose 5 % and 0.45 % NaCl 100 mL/hr at 07/19/23 1030   levETIRAcetam 1,000 mg (07/19/23 1034)   valproate sodium      Antimicrobials: Anti-infectives (From admission, onward)    None       Objective: Vitals:   07/19/23 0414 07/19/23 1109  BP: (!) 168/84 (!) 166/87  Pulse: 76 77  Resp: 16 18  Temp: 98.3 F (36.8 C) 98.4 F (36.9 C)  SpO2: 98% 94%    Intake/Output Summary (Last 24 hours) at 07/19/2023 1141 Last data filed at 07/19/2023 1100 Gross per 24 hour  Intake 1365.79 ml  Output 325 ml  Net 1040.79 ml   Filed Weights   07/19/23 1125  Weight: 90 kg   Weight change:  Body mass index is 27.67 kg/m.   Physical Exam: General exam: Elderly African-American male.  Lying on bed.  Undergoing EEG.  Not in pain. Skin: No rashes, lesions or ulcers. HEENT: Atraumatic, normocephalic, no obvious bleeding Lungs: Shallow breathing.  Clear to auscultation bilaterally otherwise CVS: Regular rate and rhythm, no murmur GI/Abd soft, grimaces on touch, mild distention present, bowel sound present.  Ostomy bag without output CNS: Tries to follow command but unable to verbalize  and consistently answer questions Psychiatry: Sad affect Extremities: No pedal edema, no calf tenderness  Data Review: I have personally reviewed the laboratory data and studies available.  F/u labs ordered Unresulted Labs (From admission, onward)     Start     Ordered   07/20/23 0500  CBC with Differential/Platelet  Daily,   R     Question:  Specimen collection method  Answer:  Lab=Lab collect   07/19/23 1141   07/20/23 0500  Basic metabolic panel  Daily,   R     Question:  Specimen collection method  Answer:  Lab=Lab collect   07/19/23 1141            Total time spent in review of labs and imaging, patient evaluation, formulation of plan, documentation and communication with family: 55 minutes  Signed, Lorin Glass, MD [facility] 07/19/2023

## 2023-07-19 NOTE — Procedures (Addendum)
[name]  MRN: [id number]  Epilepsy Attending: Charlsie Quest  Referring Physician/Provider: Caryl Pina, MD  Duration: 07/18/2023 1739 to 07/19/2023 1739   Patient history: 72yo M with seizure like activity getting eeg to evaluate for seizure   Level of alertness: Awake, asleep   AEDs during EEG study: LEV, VPA, PGB   Technical aspects: This EEG study was done with scalp electrodes positioned according to the 10-20 International system of electrode placement. Electrical activity was reviewed with band pass filter of 1-70Hz , sensitivity of 7 uV/mm, display speed of 22mm/sec with a 60Hz  notched filter applied as appropriate. EEG data were recorded continuously and digitally stored.  Video monitoring was available and reviewed as appropriate.   Description: The posterior dominant rhythm consists of 6-7 Hz activity of moderate voltage (25-35 uV) seen predominantly in posterior head regions, symmetric and reactive to eye opening and eye closing. Sleep was characterized by vertex waves, sleep spindles (12 to 14 Hz), maximal frontocentral region.  EEG showed continuous generalized predominantly 5 to 7 Hz theta slowing admixed with intermittent generalized 2 to 3 Hz delta slowing. Hyperventilation and photic stimulation were not performed.  On 07/19/2023 at 0637, 0639, 0641 and 0658, EEG showed spike and wave activity in left more than right occipital region which lasted for about 20 seconds each time without definite evolution.  Clinically, patient was noted to be laying in bed with eyes closed except at 0658 when he appeared to be mumbling something.  Patient does have history of seizures arising from this location but without clinical signs and definite evolution, these events are most likely interictal in nature.    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  On 07/19/2023 after 0637, spikes were noted more frequently without definite evolution.  This EEG pattern was on the ictal-interictal continuum with increased risk of seizures.   Additionally EEG suggestive of moderate diffuse encephalopathy.    Jaylia Pettus Annabelle Harman

## 2023-07-19 NOTE — Consult Note (Signed)
WOC Nurse ostomy consult note Stoma type/location: loop colostomy from 2023 Ostomy pouching: 2pc. 2 3/4" with 2" barrier ring Education provided: NA, patient is completely dependent in ostomy care, from SNF Enrolled patient in [employer] DC program: Yes, previously   Re consult if needed, will not follow at this time. Thanks  Kae Lauman [employer], RN,CWOCN, CNS, CWON-AP [phone])

## 2023-07-19 NOTE — Evaluation (Signed)
Clinical/Bedside Swallow Evaluation Patient Details  Name: Calvin [name] MRN: [id number] Date of Birth: May 25, 1951  Today's Date: 07/19/2023 Time: SLP Start Time (ACUTE ONLY): 1133 SLP Stop Time (ACUTE ONLY): 1151 SLP Time Calculation (min) (ACUTE ONLY): 18 min  Past Medical History:  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 side (HCC)    Hypertension    Myocardial infarction (HCC)    Neuromuscular disorder (HCC)    Ogilvie syndrome    Seizures (HCC)    last 6 months ago   Stroke [facility])    multiple  left side weakness   Tremors of nervous system    Unstable angina (HCC)    Past Surgical History:  Past Surgical History:  Procedure Laterality Date   CARPAL TUNNEL RELEASE Left 10/19/2015   Procedure: CARPAL TUNNEL RELEASE;  Surgeon: Kennedy Bucker, MD;  Location: [facility] ORS;  Service: Orthopedics;  Laterality: Left;   COLON SURGERY     COLONOSCOPY WITH PROPOFOL N/A 10/26/2021   Procedure: COLONOSCOPY WITH PROPOFOL;  Surgeon: Regis Bill, MD;  Location: [facility] ENDOSCOPY;  Service: Endoscopy;  Laterality: N/A;   COLONOSCOPY WITH PROPOFOL N/A 06/02/2022   Procedure: COLONOSCOPY WITH PROPOFOL;  Surgeon: Regis Bill, MD;  Location: [facility] ENDOSCOPY;  Service: Endoscopy;  Laterality: N/A;   COLONOSCOPY WITH PROPOFOL N/A 06/06/2022   Procedure: COLONOSCOPY WITH PROPOFOL;  Surgeon: Regis Bill, MD;  Location: [facility] ENDOSCOPY;  Service: Endoscopy;  Laterality: N/A;   JOINT REPLACEMENT     left partial hip    THROAT SURGERY  2013   cancer   HPI:  Calvin [name] is a 72 yo male presenting to [facility] ED 11/10 with sudden onset of vomiting, abdominal pain, and distension. Recently admitted  10/18-10/22 with SBO. Transferred to [facility] with concern for seizure activity 11/13. 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 Most recent MBS 10/30/22 reveals moderate oropharyngeal dysphagia with penetration and eventual aspiration of thin and nectar thick liquids as well as solids. Per note, "A cued throat clear (vs cough) was effective in ejecting penetration, however did not clear aspiration, and due to pt's positioning (head forward), this positions his pyriform sinuses directly above open airway, with residue continuing to mix with secretions and trickle into the laryngeal vestibule. Patient is at risk of aspiration regardless of consistency, however, even with chronic deficits he has been consuming PO diet for several months without reported pulmonary complications. Patient expresses desire to eat and drink by mouth vs tube. As pt followed by palliative care, recommend goals of care discussion to aid determining pt's preferences and goals for nutrition/hydration. As thicker liquids did not improve pharyngeal clearance or prevent aspiration, if PO diet is chosen recommend regular liquids". PMH includes Ogilvie's syndrome, colon cancer s/p transverse colostomy, recurrent SBO, COPD, CAD, HTN, prior CVA with residual mild L sided weakness, seizure disorder, CKD IIIa, HFpEF    Assessment / Plan / Recommendation  Clinical Impression  Pt has a significant history of oropharyngeal dysphagia characterized by heightened risk for aspiration regardless of consistency, although he continues to consume a PO diet. His daughter states that he was previously consuming  pureed foods with thickened liquids until the past 4-5 months, when he upgraded to a mechanical soft diet with thin liquids. Per chart review, he does not have a history of PNA although given signicant risk for silent aspiration, question pt's toleration of this upgraded diet. Observed pt with trials of thin liquids and purees, after which  he presented with a significantly wet vocal quality and an immediate cough. Honey thick liquids resulted in an immediate cough. Upon palpation, suspect decreased hyolaryngeal elevation and apparent difficutly initiating a swallow. Recommend he remain NPO pending completion of an instrumental swallow study. SLP will f/u as able. SLP Visit Diagnosis: Dysphagia, unspecified (R13.10)    Aspiration Risk  Severe aspiration risk    Diet Recommendation NPO    Medication Administration: Via alternative means    Other  Recommendations Oral Care Recommendations: Oral care QID;Staff/trained caregiver to provide oral care    Recommendations for follow up therapy are one component of a multi-disciplinary discharge planning process, led by the attending physician.  Recommendations may be updated based on patient status, additional functional criteria and insurance authorization.  Follow up Recommendations Skilled nursing-short term rehab (<3 hours/day)      Assistance Recommended at Discharge    Functional Status Assessment Patient has had a recent decline in their functional status and demonstrates the ability to make significant improvements in function in a reasonable and predictable amount of time.  Frequency and Duration min 2x/week  2 weeks       Prognosis Prognosis for improved oropharyngeal function: Fair Barriers to Reach Goals: Cognitive deficits;Time post onset;Severity of deficits      Swallow Study   General HPI: Calvin [name] is a 72 yo male presenting to [facility] ED 11/10 with sudden onset of vomiting, abdominal pain, and distension. Recently admitted 10/18-10/22 with SBO. Transferred to [facility] with concern for seizure activity 11/13. 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 Most recent MBS 10/30/22 reveals moderate oropharyngeal dysphagia with penetration and eventual aspiration of thin and nectar thick liquids as well as solids. Per note, "A cued throat clear (vs  cough) was effective in ejecting penetration, however did not clear aspiration, and due to pt's positioning (head forward), this positions his pyriform sinuses directly above open airway, with residue continuing to mix with secretions and trickle into the laryngeal vestibule. Patient is at risk of aspiration regardless of consistency, however, even with chronic deficits he has been consuming PO diet for several months without reported pulmonary complications. Patient expresses desire to eat and drink by mouth vs tube. As pt followed by palliative care, recommend goals of care discussion to aid determining pt's preferences and goals for nutrition/hydration. As thicker liquids did not improve pharyngeal clearance or prevent aspiration, if PO diet is chosen recommend regular liquids". PMH includes Ogilvie's syndrome, colon cancer s/p transverse colostomy, recurrent SBO, COPD, CAD, HTN, prior CVA with residual mild L sided weakness, seizure disorder, CKD IIIa, HFpEF Type of Study: Bedside Swallow Evaluation Previous Swallow Assessment: see HPI Diet Prior to this Study: NPO Temperature Spikes Noted: No Respiratory Status: Room air History of Recent Intubation: No Behavior/Cognition: Alert;Cooperative;Requires cueing Oral Cavity Assessment: Within Functional Limits Oral Care Completed by SLP: No Oral Cavity - Dentition: Edentulous Vision: Functional for self-feeding Self-Feeding Abilities: Needs assist Patient Positioning: Upright in bed Baseline Vocal Quality: Normal Volitional Cough: Wet Volitional Swallow: Able to elicit    Oral/Motor/Sensory Function Overall Oral Motor/Sensory Function: Within functional limits   Ice Chips Ice chips: Not tested  Thin Liquid Thin Liquid: Impaired Presentation: Straw;Cup Pharyngeal  Phase Impairments: Multiple swallows;Wet Vocal Quality;Cough - Immediate;Decreased hyoid-laryngeal movement    Nectar Thick Nectar Thick Liquid: Not tested   Honey Thick Honey Thick  Liquid: Impaired Presentation: Cup Pharyngeal Phase Impairments: Decreased hyoid-laryngeal movement;Cough - Immediate   Puree Puree: Impaired Presentation: Spoon Pharyngeal Phase Impairments: Decreased hyoid-laryngeal movement;Cough - Immediate   Solid     Solid: Not tested      Gwynneth Aliment, M.A., CF-SLP Speech Language Pathology, Acute Rehabilitation Services  Secure Chat preferred [phone]  07/19/2023,1:05 PM

## 2023-07-19 NOTE — Progress Notes (Signed)
SLP Cancellation Note  Patient Details Name: ULYSEE MEDLEN MRN: [id number] DOB: [birth year]   Cancelled treatment:       Reason Eval/Treat Not Completed: Patient not medically ready. Set up for a FEES for swallow assessment, but pt unable to consent and could not reach brother sister or daughter by phone. Pt reporting severe pain and inattentive to activity with SLP. Also unsure of plan to address abdominal findings. SLP will f/u tomorrow with FEES if cleared by MD and family given procedure consent.    Yaslyn Cumby, Riley Nearing 07/19/2023, 1:58 PM

## 2023-07-19 NOTE — Progress Notes (Signed)
I personally saw the patient and performed a substantive portion of the medical decision making, in conjunction with the Advanced Practice provider Carlena Bjornstad, PA-C for the treatment of  SBO.  I reviewed recent lab values, vitals, and notes.  This care required high  level of medical decision making.

## 2023-07-19 NOTE — Plan of Care (Signed)
  Problem: Clinical Measurements: Goal: Ability to maintain clinical measurements within normal limits will improve Outcome: Progressing   

## 2023-07-19 NOTE — Progress Notes (Signed)
Subjective: No seizure-like activity overnight.  Awake, alert this morning.  ROS: negative except above  Examination  Vital signs in last 24 hours: Temp:  [97.5 F (36.4 C)-98.8 F (37.1 C)] 98.1 F (36.7 C) (11/14 0721) Pulse Rate:  [71-98] 72 (11/14 0721) Resp:  [12-18] 18 (11/14 0721) BP: (136-168)/(77-109) 158/79 (11/14 0721) SpO2:  [93 %-100 %] 93 % (11/14 0721)  General: lying in bed, NAD Neuro: Awake, alert, oriented to time place and person, follows commands, pupils equally round and reactive, appears to have left hemianopia, antigravity strength in all extremities  Basic Metabolic Panel: Recent Labs  Lab 07/15/23 0327 07/16/23 0443 07/17/23 0924 07/18/23 0328 07/19/23 0808  NA 135 139 137 137 137  K 4.2 4.5 4.2 3.9 3.3*  CL 105 107 99 98 102  CO2 21* 21* 25 28 26   GLUCOSE 121* 64* 72 74 115*  BUN 16 10 11 14 9   CREATININE 1.84* 1.08 1.14 1.23 1.13  CALCIUM 9.6 8.7* 9.1 8.9 8.7*  MG 2.1  --  1.5* 1.8  --     CBC: Recent Labs  Lab 07/15/23 0327 07/16/23 0443 07/17/23 0924 07/18/23 0328 07/19/23 0808  WBC 7.2 4.7 5.8 5.3 4.3  NEUTROABS 4.9 3.0 4.2  --   --   HGB 16.2 13.0 13.0 12.5* 12.1*  HCT 52.6* 41.1 40.9 39.2 37.2*  MCV 94.3 92.8 91.1 90.7 88.2  PLT 255 129* 182 179 148*     Coagulation Studies: No results for input(s): "LABPROT", "INR" in the last 72 hours.  Imaging personally reviewed  MRI Brain wo contrast 07/18/2023:  Mildly restricted diffusion posteriorly in the right cerebral hemisphere which spans vascular territories and is favored to reflect recent seizure activity. Multifocal encephalomalacia as described above.    ASSESSMENT AND PLAN: 72 year old male with prior stroke, epilepsy admitted with breakthrough seizures  Epilepsy with breakthrough seizure -Per chart review patient was supposed to be on Keppra 750 mg twice daily and lamotrigine 50 mg as well as pregabalin for neuropathy.  Recommendations -No definite seizures on  EEG.  Therefore for now we will continue Keppra 1000 mg twice daily and Depakote 500 mg every 8 hours -Need to resume lamotrigine and pregabalin.  Will discuss with medicine team about p.o. access -Continue LTM EEG for another 24 hours. -Continue seizure precautions -as needed IV Versed for clinical seizures -Management of rest of comorbidities per primary team  I have spent a total of 36   minutes with the patient reviewing hospital notes,  test results, labs and examining the patient as well as establishing an assessment and plan that was discussed personally with the patient.  > 50% of time was spent in direct patient care.       Lindie Spruce Epilepsy [facility] For questions after 5pm please refer to AMION to reach the Neurologist on call

## 2023-07-20 DIAGNOSIS — J449 Chronic obstructive pulmonary disease, unspecified: Secondary | ICD-10-CM

## 2023-07-20 DIAGNOSIS — Z515 Encounter for palliative care: Secondary | ICD-10-CM

## 2023-07-20 DIAGNOSIS — R569 Unspecified convulsions: Secondary | ICD-10-CM | POA: Diagnosis not present

## 2023-07-20 LAB — BASIC METABOLIC PANEL
Anion gap: 11 (ref 5–15)
BUN: 5 mg/dL — ABNORMAL LOW (ref 8–23)
CO2: 23 mmol/L (ref 22–32)
Calcium: 8.4 mg/dL — ABNORMAL LOW (ref 8.9–10.3)
Chloride: 104 mmol/L (ref 98–111)
Creatinine, Ser: 1.22 mg/dL (ref 0.61–1.24)
GFR, Estimated: >60 mL/min (ref >60)
Glucose, Bld: 90 mg/dL (ref 70–99)
Potassium: 3.4 mmol/L — ABNORMAL LOW (ref 3.5–5.1)
Sodium: 138 mmol/L (ref 135–145)

## 2023-07-20 LAB — CBC WITH DIFFERENTIAL/PLATELET
Abs Immature Granulocytes: 0.02 10*3/uL (ref 0.00–0.07)
Basophils Absolute: 0 10*3/uL (ref 0.0–0.1)
Basophils Relative: 0 %
Eosinophils Absolute: 0 10*3/uL (ref 0.0–0.5)
Eosinophils Relative: 1 %
HCT: 41.9 % (ref 39.0–52.0)
Hemoglobin: 13.6 g/dL (ref 13.0–17.0)
Immature Granulocytes: 1 %
Lymphocytes Relative: 25 %
Lymphs Abs: 1 10*3/uL (ref 0.7–4.0)
MCH: 29.1 pg (ref 26.0–34.0)
MCHC: 32.5 g/dL (ref 30.0–36.0)
MCV: 89.7 fL (ref 80.0–100.0)
Monocytes Absolute: 0.4 10*3/uL (ref 0.1–1.0)
Monocytes Relative: 10 %
Neutro Abs: 2.6 10*3/uL (ref 1.7–7.7)
Neutrophils Relative %: 63 %
Platelets: 142 10*3/uL — ABNORMAL LOW (ref 150–400)
RBC: 4.67 MIL/uL (ref 4.22–5.81)
RDW: 13 % (ref 11.5–15.5)
WBC: 4.1 10*3/uL (ref 4.0–10.5)
nRBC: 0 % (ref 0.0–0.2)

## 2023-07-20 MED ORDER — DIVALPROEX SODIUM 250 MG PO DR TAB
1000.0000 mg | DELAYED_RELEASE_TABLET | Freq: Two times a day (BID) | ORAL | Status: DC
  Administered 2023-07-20 – 2023-07-24 (×8): 1000 mg via ORAL
  Filled 2023-07-20 (×8): qty 4

## 2023-07-20 MED ORDER — LEVETIRACETAM 500 MG PO TABS
1000.0000 mg | ORAL_TABLET | Freq: Two times a day (BID) | ORAL | Status: DC
  Administered 2023-07-20 – 2023-07-24 (×8): 1000 mg via ORAL
  Filled 2023-07-20 (×8): qty 2

## 2023-07-20 NOTE — Procedures (Signed)
Objective Swallowing Evaluation: Type of Study: FEES-Fiberoptic Endoscopic Evaluation of Swallow   Patient Details  Name: Calvin [name] MRN: [id number] Date of Birth: 1950/11/05  Today's Date: 07/20/2023 Time: SLP Start Time (ACUTE ONLY): 1320 -SLP Stop Time (ACUTE ONLY): 1345  SLP Time Calculation (min) (ACUTE ONLY): 25 min   Past Medical History:  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 side (HCC)    Hypertension    Myocardial infarction (HCC)    Neuromuscular disorder (HCC)    Ogilvie syndrome    Seizures (HCC)    last 6 months ago   Stroke [facility])    multiple  left side weakness   Tremors of nervous system    Unstable angina (HCC)    Past Surgical History:  Past Surgical History:  Procedure Laterality Date   CARPAL TUNNEL RELEASE Left 10/19/2015   Procedure: CARPAL TUNNEL RELEASE;  Surgeon: Kennedy Bucker, MD;  Location: [facility] ORS;  Service: Orthopedics;  Laterality: Left;   COLON SURGERY     COLONOSCOPY WITH PROPOFOL N/A 10/26/2021   Procedure: COLONOSCOPY WITH PROPOFOL;  Surgeon: Regis Bill, MD;  Location: [facility] ENDOSCOPY;  Service: Endoscopy;  Laterality: N/A;   COLONOSCOPY WITH PROPOFOL N/A 06/02/2022   Procedure: COLONOSCOPY WITH PROPOFOL;  Surgeon: Regis Bill, MD;  Location: [facility] ENDOSCOPY;  Service: Endoscopy;  Laterality: N/A;   COLONOSCOPY WITH PROPOFOL N/A 06/06/2022   Procedure: COLONOSCOPY WITH PROPOFOL;  Surgeon: Regis Bill, MD;  Location: [facility] ENDOSCOPY;  Service: Endoscopy;  Laterality: N/A;   JOINT REPLACEMENT     left partial hip    THROAT SURGERY  2013   cancer   HPI: Calvin [name] is a 72 yo male presenting to [facility] ED 11/10 with sudden  onset of vomiting, abdominal pain, and distension. Recently admitted 10/18-10/22 with SBO. Transferred to [facility] with concern for seizure activity 11/13. 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 Most recent MBS 10/30/22 reveals moderate oropharyngeal dysphagia with penetration and eventual aspiration of thin and nectar thick liquids as well as solids. Per note, "A cued throat clear (vs cough) was effective in ejecting penetration, however did not clear aspiration, and due to pt's positioning (head forward), this positions his pyriform sinuses directly above open airway, with residue continuing to mix with secretions and trickle into the laryngeal vestibule. Patient is at risk of aspiration regardless of consistency, however, even with chronic deficits he has been consuming PO diet for several months without reported pulmonary complications. Patient expresses desire to eat and drink by mouth vs tube. As pt followed by palliative care, recommend goals of care discussion to aid determining pt's preferences and goals for nutrition/hydration. As thicker liquids did not improve pharyngeal clearance or prevent aspiration, if PO diet is chosen recommend regular liquids". 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  Recommendations may be updated based on  patient status, additional functional criteria and insurance authorization.  Assessment / Plan / Recommendation     07/20/2023    2:00 PM  Clinical Impressions  Clinical Impression Pt demosntrates moderate oropharyngeal dysphagia. He is unable to masticate given no dentition and perseverative behavior on particulate matter in his  mouth. He initiates laryngeal closure in a timely manner, but has almost absent epiglottic deflection and vestibular closure as well as a horizonal pharynx, so he penetrated all liquids to the level of the glottis. There is somewhat slow PES opening with some backflow and decreased negative pharyngeal pressure. Pt senses and ejects penetrate and aspirate (he has very errythemtous false vocal folds likely due to frequent coughing). There is significant pharyngeal residue with thick liquids and this is harder for pt to clear than thins. If a liquid wash is given, pt can clear puree residue from valleculae. Pts swallowing function is chronically impaired and he does tolerate thin liquids at baseline. Recommend initaiting full liquids per surgery and advance to purees when pt is cleared to do so. Pt is expected to cough a great deal. Discussed with RN.  SLP Visit Diagnosis Dysphagia, oropharyngeal phase (R13.12)  Impact on safety and function Moderate aspiration risk         07/20/2023    2:00 PM  Treatment Recommendations  Treatment Recommendations Therapy as outlined in treatment plan below        07/20/2023    2:00 PM  Prognosis  Prognosis for improved oropharyngeal function Fair  Barriers to Reach Goals Cognitive deficits;Time post onset;Severity of deficits       07/20/2023    2:00 PM  Diet Recommendations  SLP Diet Recommendations Dysphagia 1 (Puree) solids;Thin liquid  Liquid Administration via Straw;Cup  Medication Administration Whole meds with puree  Compensations Slow rate;Small sips/bites;Clear throat intermittently;Hard cough after swallow  Postural Changes Remain semi-upright after after feeds/meals (Comment);Seated upright at 90 degrees         07/20/2023    2:00 PM  Other Recommendations  Oral Care Recommendations Oral care BID  Follow Up Recommendations Skilled nursing-short term rehab (<3 hours/day)       07/20/2023    2:00 PM  Frequency and Duration   Speech  Therapy Frequency (ACUTE ONLY) min 2x/week  Treatment Duration 2 weeks         07/20/2023    2:00 PM  Oral Phase  Oral Phase Impaired  Oral - Nectar Straw WFL  Oral - Thin Teaspoon WFL  Oral - Thin Cup WFL  Oral - Thin Straw WFL  Oral - Mech Soft Reduced posterior propulsion;Holding of bolus;Lingual/palatal residue;Delayed oral transit;Decreased bolus cohesion;Impaired mastication       07/20/2023    2:00 PM  Pharyngeal Phase  Pharyngeal Phase Impaired  Pharyngeal- Nectar Teaspoon Delayed swallow initiation-pyriform sinuses;Reduced epiglottic inversion;Reduced tongue base retraction;Penetration/Aspiration before swallow;Penetration/Aspiration during swallow;Pharyngeal residue - valleculae;Pharyngeal residue - pyriform;Inter-arytenoid space residue;Trace aspiration  Pharyngeal Material enters airway, CONTACTS cords and then ejected out;Material enters airway, passes BELOW cords then ejected out  Pharyngeal- Nectar Straw Delayed swallow initiation-pyriform sinuses;Reduced epiglottic inversion;Reduced tongue base retraction;Penetration/Aspiration before swallow;Penetration/Aspiration during swallow;Pharyngeal residue - valleculae;Pharyngeal residue - pyriform;Inter-arytenoid space residue;Trace aspiration  Pharyngeal Material enters airway, CONTACTS cords and then ejected out;Material enters airway, passes BELOW cords then ejected out  Pharyngeal- Thin Straw Delayed swallow initiation-pyriform sinuses;Reduced epiglottic inversion;Reduced tongue base retraction;Penetration/Aspiration before swallow;Penetration/Aspiration during swallow;Inter-arytenoid space residue;Trace aspiration  Pharyngeal- Puree Reduced epiglottic inversion;Pharyngeal residue - valleculae;Pharyngeal residue - pyriform;Inter-arytenoid space residue;Lateral channel residue  06/19/2022    3:00 PM  Cervical Esophageal Phase   Cervical Esophageal Phase Impaired     Calvin [name], Riley Nearing 07/20/2023, 2:53 PM

## 2023-07-20 NOTE — Progress Notes (Signed)
PROGRESS NOTE  Calvin [name]  DOB: 10-10-1950  PCP: Lucita Ferrara, MD BJY:[id number]  DOA: 07/18/2023  LOS: 2 days  Hospital Day: 3  Brief narrative: Calvin [name] is a 72 y.o. male who is a long-term resident at [facility] care facility at [facility],  PMH significant for HTN, CAD, diastolic CHF, CKD, CVA with left hemiparesis, seizure disorder, COPD, h/o Ogilvie's syndrome, colon cancer s/p transverse colectomy, recurrent SBO with prior recent hospitalization 10/18-22, COPD 11/10, patient presented to ED at [facility] with abdominal pain CT abdomen showed diffusely fluid-filled and distended small bowel loops up to about 3.7 cm diameter in the jejunal region with no discrete transition zone.  SBO was suspected. General surgery was consulted.  Patient refused NG tube. Started on conservative management Patient was admitted to [facility]. Repeat x-ray KUB on 11/12 showed interval resolution of small bowel obstruction  However, On the morning of 11/12, patient was noted to have a seizure-like activity.  Per report, patient had generalized jerky movements that lasted for about 30 seconds, less focused eyes, somewhat sleepy afterwards but still speaking and communicating throughout the event.  CT head did not show any acute abnormality but showed multifocal encephalomalacia and asymmetric right hemispheric atrophy. Seen by neurology  EEG was obtained which showed focal seizure in the left parietal occipital region. MRI brain showed mildly restricted diffusion posteriorly in the right cerebral hemisphere which spans vascular territories and is favored to reflect recent seizure activity. 11/13, patient was transferred to was called for long-term EEG.  Subjective: Patient was seen and examined this morning. Lying on bed.  Looks more awake to me than yesterday.  Able to verbalize words.  Says he is at [facility].  He got surprised when I told him that he was at [facility]. Unable to answer other  questions for me. Only some gas in ostomy bag.  No output.  Assessment and plan: Complex partial seizure  H/o seizure disorder 11/12, patient had partial seizure while at [facility].  EEG showed focal seizure in the left parietal-occipital region CT and MRI as above Transferred to [facility] for long-term EEG. Neurology consulted Currently on Keppra, valproate, Lyrica, Lamictal Seizure precautions  SBO  h/o Ogilvie's syndrome, recurrent SBO H/o colon cancer s/p transverse colectomy, colostomy status Recently hospitalized 10/18-22 for SBO and managed with conservative approach. Admitted again with SBO. Was seen by general surgery at [facility].  Refused NG tube. Managed with conservative approach. 11/12, repeat KUB showed interval resolution of small bowel obstruction 11/14, general surgery was consulted.  Repeat KUB showed no evidence of bowel obstruction.  No further intervention required.  No output in ostomy bag however.  Dysphagia Speech therapy following.  Planning for FEES today  AKI on CKD 2 Creatinine normal at baseline.  Present with creatinine elevated 1.84.  Improved with hydration. Continue to monitor Recent Labs    06/03/23 1208 06/22/23 0409 06/23/23 0513 06/26/23 0444 07/15/23 0327 07/16/23 0443 07/17/23 0924 07/18/23 0328 07/19/23 0808 07/20/23 0508  BUN 9 26* 22 11 16 10 11 14 9  5*  CREATININE 1.17 1.42* 1.21 0.90 1.84* 1.08 1.14 1.23 1.13 1.22   L temporal pain Per hospitalist at [facility], patient described episodic stabbing pain in the L temporal area on 11/12- now along L posterior head and along L body Urgent ESR/CRP were obtained and were unremarkable, low suspicion for temporal arteritis. IV Tylenol f given for pain control.  Hospitalist at [facility] discussed this with vascular surgeon Dr. Wallace Cullens, who reported  that >90% of temporal biopsies are negative and he would prefer to monitor clinically. Patient did not mention any concern of a left temporal pain on my  evaluation. Continue to monitor.  Chronic diastolic CHF Hypertension Blood pressure in 150s and 160s. PTA meds- metoprolol, lisinopril Currently continuing both.  CAD, HLD  No anginal symptoms currently. PTA meds- aspirin, statin.  Continue both.  H/o right CVA with residual left hemiparesis Anxiety/depression Continue aspirin and statin Supportive care. PTA meds- Celexa, baclofen, Lyrica.  Continue same.  COPD Respiratory status stable.  Continue as needed bronchodilators   Mobility: Seems bedbound, chronically??  Goals of care   Code Status: Full Code.  Palliative care consulted.    DVT prophylaxis:  enoxaparin (LOVENOX) injection 40 mg Start: 07/19/23 1000   Antimicrobials: None Fluid: Currently on IV D5 half NS at 100 mL/h.  I will continue IV fluid until oral intake is insured. Consultants: General Surgery Family Communication: None at bedside  Status: Inpatient Level of care:  Progressive   Patient is from: Long-term nursing facility Needs to continue in-hospital care: Undergoing EEG. SBO eval Anticipated d/c to: Hopefully back to the same facility, unclear when    Diet:  Diet Order             Diet NPO time specified  Diet effective now                   Scheduled Meds:  aspirin  81 mg Oral Daily   baclofen  5 mg Oral BID   Chlorhexidine Gluconate Cloth  6 each Topical Q0600   citalopram  10 mg Oral Daily   divalproex  1,000 mg Oral BID   enoxaparin (LOVENOX) injection  40 mg Subcutaneous Q24H   lamoTRIgine  50 mg Oral BID   levETIRAcetam  1,000 mg Oral BID   lisinopril  5 mg Oral Daily   metoprolol succinate  25 mg Oral Daily   mupirocin ointment  1 Application Nasal BID   pregabalin  100 mg Oral QHS   pregabalin  50 mg Oral BID WC   simvastatin  20 mg Oral QHS   tamsulosin  0.4 mg Oral QPM   umeclidinium bromide  1 puff Inhalation Daily    PRN meds: LORazepam, morphine (PF)   Infusions:     Antimicrobials: Anti-infectives  (From admission, onward)    None       Objective: Vitals:   07/20/23 0739 07/20/23 1109  BP: (!) 145/84 (!) 171/95  Pulse: 72 77  Resp: 17 17  Temp: 98.2 F (36.8 C) (!) 97.4 F (36.3 C)  SpO2: 97% 98%    Intake/Output Summary (Last 24 hours) at 07/20/2023 1355 Last data filed at 07/20/2023 0740 Gross per 24 hour  Intake 999.88 ml  Output 1650 ml  Net -650.12 ml   Filed Weights   07/19/23 1125  Weight: 90 kg   Weight change:  Body mass index is 27.67 kg/m.   Physical Exam: General exam: Elderly African-American male.  Lying on bed.  Undergoing EEG.  Not in pain. Skin: No rashes, lesions or ulcers. HEENT: Atraumatic, normocephalic, no obvious bleeding Lungs: Shallow breathing.  Clear to auscultation bilaterally otherwise CVS: Regular rate and rhythm, no murmur GI/Abd soft, grimaces on touch, mild distention present, bowel sound present.  Ostomy bag, with gas only without output CNS: Alert, awake.  More awake today. Psychiatry: Sad affect Extremities: No pedal edema, no calf tenderness  Data Review: I have personally reviewed the laboratory  data and studies available.  F/u labs ordered Unresulted Labs (From admission, onward)     Start     Ordered   07/20/23 0500  CBC with Differential/Platelet  Daily,   R     Question:  Specimen collection method  Answer:  Lab=Lab collect   07/19/23 1141   07/20/23 0500  Basic metabolic panel  Daily,   R     Question:  Specimen collection method  Answer:  Lab=Lab collect   07/19/23 1141            Total time spent in review of labs and imaging, patient evaluation, formulation of plan, documentation and communication with family: 45 minutes  Signed, Lorin Glass, MD [facility] 07/20/2023

## 2023-07-20 NOTE — Consult Note (Signed)
Consultation Note Date: 07/20/2023 at 1100  [name]  DOB: 12/17/1950  MRN: [id number]  Age / Sex: 72 y.o., male  PCP: Lucita Ferrara, MD Referring Physician: Lorin Glass, MD  HPI/Patient Profile: 72 y.o. male  with past medical history of recurrent/recalcitrant Ogilvie syndrome, ileus, HTN, HLD, COPD, stroke (mild left-sided weakness), GERD, gout, depression with anxiety, seizures, CAD, MI, alcohol abuse, HFpEF, CKD (3A), colon cancer, thyroid cancer, and robotic assisted lap loop transverse colostomy (October 2023) admitted from [facility] on 07/18/2023 with concern for seizures.  Patient was transferred from [facility] for long-term EEG monitoring.  Patient suspected to have SBO versus ileus.  Diet is advanced to clears.  Patient has gas in ostomy but has not had a bowel movement since admission.  Patient has refused NG tube placement despite abdominal distention and vomiting.  EEG in progress during my visit.   PMT was consulted to discuss goals of care.  Of note, patient is removed to our service as we have followed him in previous hospitalizations.   Clinical Assessment and Goals of Care: Extensive chart review completed prior to meeting patient including labs, vital signs, imaging, progress notes, orders, and available advanced directive documents from current and previous encounters. I then met with patient at bedside to discuss diagnosis prognosis, GOC, EOL wishes, disposition and options.  I introduced Palliative Medicine as specialized medical care for people living with serious illness. It focuses on providing relief from the symptoms and stress of a serious illness. The goal is to improve quality of life for both the patient and the family.  We discussed patient's current illness-SBO/ileus and concern for seizures-and what it means in the larger context of patient's on-going  co-morbidities-HFpEF, COPD, Ogilvie syndrome.  I attempted to gauge patient's current understanding of his medical situation.  He shares he has been through a lot.  He repeats the same S[place]ett several times.  He is unable to be specific about what his current medical situation is.  I am hesitant to believe that patient can participate in goals of care medical decision making independently at this time.  He is able to participate in a pain assessment but unable to recall specific details of his medical status.  I highlighted that patient has expressed his wishes in the past as far as CODE STATUS and boundaries of care.  Patient confirmed to me today that he wants anything and everything done to keep him alive.  I discussed in detail that this means in the event of a cardiopulmonary arrest that we would perform CPR, ACLS protocol, placed patient on a ventilator, and he would be unable to make medical decisions for himself at that time.  Patient states that he wants to be kept alive as long as possible.  In the event that he is unable to speak for himself, he endorses that his daughter Calvin [name] would be his next of kin decision maker.  No adjustment to [facility] needed at this time.  After visiting with the patient, attempted  to speak with patient's daughter Calvin [name] over the phone.  No answer.  HIPAA compliant voicemail with PMT contact info left for [employer] to call us back.  Full code and full scope remain.  PMT has a high census and low staffing over the weekend.  Please utilize AMION for acute palliative needs.  Otherwise, a provider will follow-up with patient and family on Monday - unless Calvin [name] returns PMT phone call before that time.  Primary Decision Maker NEXT OF KIN  Physical Exam Vitals reviewed.  Constitutional:      General: He is not in acute distress.    Appearance: He is normal weight.  HENT:     Head: Normocephalic.     Mouth/Throat:     Mouth: Mucous membranes are moist.   Eyes:     Pupils: Pupils are equal, round, and reactive to light.  Cardiovascular:     Rate and Rhythm: Normal rate.  Pulmonary:     Effort: Pulmonary effort is normal.  Abdominal:     Palpations: Abdomen is soft.  Musculoskeletal:     Comments: Left sided weakness  Neurological:     Mental Status: He is alert.     Comments: Oriented to self and place  Psychiatric:        Mood and Affect: Mood normal.        Behavior: Behavior normal.        Thought Content: Thought content normal.        Judgment: Judgment normal.     Palliative Assessment/Data: 30%     Thank you for this consult. Palliative medicine will continue to follow and assist holistically.   Time Total: 75 minutes  Time spent includes: Detailed review of medical records (labs, imaging, vital signs), medically appropriate exam (mental status, respiratory, cardiac, skin), discussed with treatment team, counseling and educating patient, family and staff, documenting clinical information, medication management and coordination of care.  Signed by: Georgiann Cocker, DNP, FNP-BC Palliative Medicine   Please contact Palliative Medicine Team providers via [facility] for questions and concerns.

## 2023-07-20 NOTE — Progress Notes (Signed)
Subjective: No acute events overnight.  No new concerns.  ROS: negative except above  Examination  Vital signs in last 24 hours: Temp:  [97.4 F (36.3 C)-98.8 F (37.1 C)] 97.4 F (36.3 C) (11/15 1109) Pulse Rate:  [72-84] 77 (11/15 1109) Resp:  [16-18] 17 (11/15 1109) BP: (144-171)/(67-95) 171/95 (11/15 1109) SpO2:  [93 %-98 %] 98 % (11/15 1109)  General: lying in bed, NAD Neuro: Awake, alert, oriented to time place and person, follows commands, pupils equally round and reactive, left hemianopia appears to be improving, 5/5 in right upper extremity, 3/5 in left upper extremity, 3/5 in right lower extremity, 2/5 in left lower extremity  Basic Metabolic Panel: Recent Labs  Lab 07/15/23 0327 07/16/23 0443 07/17/23 0924 07/18/23 0328 07/19/23 0808 07/20/23 0508  NA 135 139 137 137 137 138  K 4.2 4.5 4.2 3.9 3.3* 3.4*  CL 105 107 99 98 102 104  CO2 21* 21* 25 28 26 23   GLUCOSE 121* 64* 72 74 115* 90  BUN 16 10 11 14 9  5*  CREATININE 1.84* 1.08 1.14 1.23 1.13 1.22  CALCIUM 9.6 8.7* 9.1 8.9 8.7* 8.4*  MG 2.1  --  1.5* 1.8  --   --     CBC: Recent Labs  Lab 07/15/23 0327 07/16/23 0443 07/17/23 0924 07/18/23 0328 07/19/23 0808  WBC 7.2 4.7 5.8 5.3 4.3  NEUTROABS 4.9 3.0 4.2  --   --   HGB 16.2 13.0 13.0 12.5* 12.1*  HCT 52.6* 41.1 40.9 39.2 37.2*  MCV 94.3 92.8 91.1 90.7 88.2  PLT 255 129* 182 179 148*     Coagulation Studies: No results for input(s): "LABPROT", "INR" in the last 72 hours.  Imaging No new brain imaging overnight  ASSESSMENT AND PLAN: 72 year old male with prior stroke, epilepsy admitted with breakthrough seizures   Epilepsy with breakthrough seizure -Per chart review patient was supposed to be on Keppra 750 mg twice daily and lamotrigine 50 mg as well as pregabalin for neuropathy.   Recommendations -No definite seizures on EEG.  Will DC LTM EEG tomorrow if no further seizures -Continue lamotrigine and pregabalin -Continue Keppra and  Depakote, will switch to p.o. -Continue seizure precautions -as needed IV Versed for clinical seizures -Management of rest of comorbidities per primary team   I have spent a total of 35 minutes with the patient reviewing hospital notes,  test results, labs and examining the patient as well as establishing an assessment and plan that was discussed personally with the patient.  > 50% of time was spent in direct patient care.    Lindie Spruce Epilepsy [facility] For questions after 5pm please refer to AMION to reach the Neurologist on call

## 2023-07-20 NOTE — TOC Progression Note (Signed)
Transition of Care [facility]) - Progression Note    Patient Details  Name: KUMAIL TREMAIN MRN: [id number] Date of Birth: 1951-04-08  Transition of Care [facility]) CM/SW Contact  Baldemar Lenis, [place] Phone Number: 07/20/2023, 3:58 PM  Clinical Narrative:   CSW following for return to [facility] when medically stable.    Expected Discharge Plan: Skilled Nursing Facility Barriers to Discharge: Continued Medical Work up  Expected Discharge Plan and Services                                               Social Determinants of Health (SDOH) Interventions SDOH Screenings   Food Insecurity: No Food Insecurity (07/19/2023)  Housing: Low Risk  (07/19/2023)  Transportation Needs: No Transportation Needs (07/19/2023)  Utilities: Not At Risk (07/19/2023)  Tobacco Use: Medium Risk (07/19/2023)    Readmission Risk Interventions    07/17/2023   12:24 PM 06/30/2022    2:25 PM 06/02/2022   12:02 PM  Readmission Risk Prevention Plan  Transportation Screening Complete Complete Complete  PCP or Specialist Appt within 3-5 Days Complete    Social Work Consult for Recovery Care Planning/Counseling Complete    Palliative Care Screening Not Applicable    Medication Review Oceanographer) Complete  Complete  PCP or Specialist appointment within 3-5 days of discharge   Complete  HRI or Home Care Consult  Complete Complete  SW Recovery Care/Counseling Consult  Complete Complete  Palliative Care Screening  Complete Complete  Skilled Nursing Facility  Complete Complete

## 2023-07-20 NOTE — Progress Notes (Signed)
   07/19/23 1116  Spiritual Encounters  Type of Visit Initial  Care provided to: Patient  Referral source Other (comment)  Reason for visit Advance directives  OnCall Visit No  Advance Directives (For Healthcare)  Does Patient Have a Medical Advance Directive? No  Would patient like information on creating a medical advance directive? Yes (Inpatient - patient requests chaplain consult to create a medical advance directive)  Mental Health Advance Directives  Does Patient Have a Mental Health Advance Directive? No  Would patient like information on creating a mental health advance directive? Yes (Inpatient - patient requests chaplain consult to create a mental health advance directive)   Ch responded to request for AD. Patient declined visit. Chaplain consulted with bedside nurse she said she did not put an order for AD for patient. Chaplain remains available if needed.

## 2023-07-20 NOTE — Progress Notes (Signed)
Patient ID: Calvin [name], male   DOB: 12/14/1950, 72 y.o.   MRN: [id number] [name]     Subjective: CC-  No abdominal complaints this morning. He has air in his ostomy appliance, no stool. Denies abdominal pain, nausea, or vomiting. Asking for soup.  Objective: Vital signs in last 24 hours: Temp:  [98 F (36.7 C)-98.8 F (37.1 C)] 98.2 F (36.8 C) (11/15 0739) Pulse Rate:  [72-84] 72 (11/15 0739) Resp:  [16-18] 17 (11/15 0739) BP: (144-166)/(67-88) 145/84 (11/15 0739) SpO2:  [93 %-97 %] 97 % (11/15 0739) Weight:  [90 kg] 90 kg (11/14 1125) Last BM Date : 07/18/23  Intake/Output from previous day: 11/14 0701 - 11/15 0700 In: 1528.2 [I.V.:1103.7; IV Piggyback:424.5] Out: 1800 [Urine:1800] Intake/Output this shift: Total I/O In: -  Out: 500 [Urine:500]  PE: Gen:  Alert, NAD, undergoing EEG Abd: soft, nondistended, bowel sounds present, nontender, Loop colostomy in left abdomen pink with air in bag/ no stool  Lab Results:  Recent Labs    07/18/23 0328 07/19/23 0808  WBC 5.3 4.3  HGB 12.5* 12.1*  HCT 39.2 37.2*  PLT 179 148*   BMET Recent Labs    07/19/23 0808 07/20/23 0508  NA 137 138  K 3.3* 3.4*  CL 102 104  CO2 26 23  GLUCOSE 115* 90  BUN 9 5*  CREATININE 1.13 1.22  CALCIUM 8.7* 8.4*   PT/INR No results for input(s): "LABPROT", "INR" in the last 72 hours. CMP     Component Value Date/Time   NA 138 07/20/2023 0508   NA 143 08/14/2014 0933   K 3.4 (L) 07/20/2023 0508   K 3.9 08/14/2014 0933   CL 104 07/20/2023 0508   CL 110 (H) 08/14/2014 0933   CO2 23 07/20/2023 0508   CO2 27 08/14/2014 0933   GLUCOSE 90 07/20/2023 0508   GLUCOSE 88 08/14/2014 0933   BUN 5 (L) 07/20/2023 0508   BUN 7 08/14/2014 0933   CREATININE 1.22 07/20/2023 0508   CREATININE 0.89 08/14/2014 0933   CALCIUM 8.4 (L) 07/20/2023 0508   CALCIUM 8.4 (L) 08/14/2014 0933   PROT 9.2 (H) 07/15/2023 0327   PROT 7.3 08/14/2014 0933   ALBUMIN 5.0  07/15/2023 0327   ALBUMIN 3.6 08/14/2014 0933   AST 16 07/15/2023 0327   AST 21 08/14/2014 0933   ALT 11 07/15/2023 0327   ALT 20 08/14/2014 0933   ALKPHOS 75 07/15/2023 0327   ALKPHOS 90 08/14/2014 0933   BILITOT 0.6 07/15/2023 0327   BILITOT 0.5 08/14/2014 0933   GFRNONAA >60 07/20/2023 0508   GFRNONAA >60 08/14/2014 0933   GFRNONAA >60 03/21/2014 2111   GFRAA 58 (L) 10/12/2019 0807   GFRAA >60 08/14/2014 0933   GFRAA >60 03/21/2014 2111   Lipase     Component Value Date/Time   LIPASE 37 07/15/2023 0327   LIPASE 155 08/27/2013 1545       Studies/Results: DG Abd Portable 1V  Result Date: 07/19/2023 CLINICAL DATA:  [id number] SBO (small bowel obstruction) (HCC) [id number] EXAM: PORTABLE ABDOMEN - 1 VIEW COMPARISON:  07/17/2023 FINDINGS: The bowel gas pattern is nonobstructive. Small amount of enteric contrast is present within the left colon. High attenuation structure is again seen within the central pelvis. No radio-opaque calculi or other significant radiographic abnormality are seen. IMPRESSION: Nonobstructive bowel gas pattern. Electronically Signed   By: Duanne Guess D.O.   On: 07/19/2023 15:24   Overnight EEG with video  Result Date: 07/19/2023  Charlsie Quest, MD     07/19/2023  9:36 AM [name] MRN: [id number] Epilepsy Attending: Charlsie Quest Referring Physician/Provider: Caryl Pina, MD Duration: 07/18/2023 1739 to 07/19/2023 0935  Patient history: 72yo M with seizure like activity getting eeg to evaluate for seizure  Level of alertness: Awake, asleep  AEDs during EEG study: LEV, VPA, PGB  Technical aspects: This EEG study was done with scalp electrodes positioned according to the 10-20 International system of electrode placement. Electrical activity was reviewed with band pass filter of 1-70Hz , sensitivity of 7 uV/mm, display speed of 71mm/sec with a 60Hz  notched filter applied as appropriate. EEG data were recorded continuously and digitally  stored.  Video monitoring was available and reviewed as appropriate.  Description: The posterior dominant rhythm consists of 6-7 Hz activity of moderate voltage (25-35 uV) seen predominantly in posterior head regions, symmetric and reactive to eye opening and eye closing. Sleep was characterized by vertex waves, sleep spindles (12 to 14 Hz), maximal frontocentral region.  EEG showed continuous generalized predominantly 5 to 7 Hz theta slowing admixed with intermittent generalized 2 to 3 Hz delta slowing. Hyperventilation and photic stimulation were not performed. On 07/19/2023 at 0637, 0639, 0641 and 0658, EEG showed spike and wave activity in left more than right occipital region which lasted for about 20 seconds each time without definite evolution.  Clinically, patient was noted to be laying in bed with eyes closed except at 0658 when he appeared to be mumbling something.  Patient does have history of seizures arising from this location but without clinical signs and definite evolution, these events are most likely interictal in nature.  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  On 07/19/2023 after 0637, spikes were noted more frequently without definite evolution.  This EEG pattern was on the ictal-interictal continuum with increased risk of seizures.  Additionally EEG suggestive of moderate diffuse encephalopathy.  Charlsie Quest   MR BRAIN WO CONTRAST  Result Date: 07/18/2023 CLINICAL DATA:  Seizure. EXAM: MRI HEAD WITHOUT CONTRAST TECHNIQUE: Multiplanar, multiecho pulse sequences of the brain and surrounding structures were obtained without intravenous contrast. COMPARISON:  Head CT 07/17/2023 and MRI 04/24/2023 FINDINGS: The study is motion degraded, including severe motion on the thin-section coronal T2 sequence through the temporal lobes.  Brain: There are new confluent regions of mildly restricted diffusion and T2 hyperintensity involving portions of the right parietal, temporal, and occipital lobes as well as right cingulate gyrus. Chronic cortical infarcts are again noted in the right frontal and right occipital lobes, and there is also unchanged encephalomalacia in the anteroinferior right greater than left frontal lobes and anterior right temporal lobe typically seen with remote trauma. Asymmetric, diffuse right cerebral hemispheric atrophy may be related to the patient's chronic seizure disorder. Chronic there is a background of mild chronic small vessel ischemia in the cerebral white matter bilaterally. Small chronic left cerebellar infarcts are unchanged. Vascular: Chronically occluded distal left vertebral artery. Skull and upper cervical spine: No suspicious marrow lesion. Sinuses/Orbits: Remote right orbital fracture. Polyp or mucous retention cyst in the right sphenoid sinus. Clear mastoid air cells. Other: None. IMPRESSION: 1. Mildly restricted diffusion posteriorly in the right cerebral hemisphere which spans vascular territories and is favored to reflect recent seizure activity. 2. Multifocal encephalomalacia as described above. Electronically Signed   By: Jolaine Click.D.  On: 07/18/2023 14:23   DG Pelvis 1-2 Views  Result Date: 07/18/2023 CLINICAL DATA:  [id number] MRI contraindicated due to metal implant [id number] EXAM: PELVIS - 1-2 VIEW COMPARISON:  07/17/2023, 07/15/2023, 06/22/2023 FINDINGS: There is no evidence of pelvic fracture or diastasis. 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 1.2 cm high attenuation structure within the mid pelvis which was noted to be within the sigmoid colon on previous CT. This structure is denser than enteric contrast and could represent an intraluminal metallic foreign body. Atherosclerotic vascular calcifications are present. IMPRESSION: 1.  There is a 1.2 cm high attenuation structure within the mid pelvis which was noted to be within the sigmoid colon on previous CT. This structure is denser than enteric contrast and could represent an intraluminal metallic foreign body. 2. 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 Electronically Signed   By: Duanne Guess D.O.   On: 07/18/2023 13:10   EEG adult  Result Date: 07/18/2023 Charlsie Quest, MD     07/18/2023  1:00 PM atient Name: Calvin [name] MRN: [id number] Epilepsy Attending: Charlsie Quest Referring Physician/Provider: Gordy Councilman, MD Date: 07/18/2023 Duration: 1 hour 4 mins  Patient history: 72yo M with seizure like activity getting eeg to evaluate for seizure  Level of alertness: Awake  AEDs during EEG study: LEV, VPA, PGB  Technical aspects: This EEG study was done with scalp electrodes positioned according to the 10-20 International system of electrode placement. Electrical activity was reviewed with band pass filter of 1-70Hz , sensitivity of 7 uV/mm, display speed of 9mm/sec with a 60Hz  notched filter applied as appropriate. EEG data were recorded continuously and digitally stored.  Video monitoring was available and reviewed as appropriate.  Description: EEG showed continuous generalized predominantly 5 to 7 Hz theta slowing admixed with intermittent generalized 2 to 3 Hz delta slowing. Hyperventilation and photic stimulation were not performed.    ABNORMALITY - Continuous slow, generalized  IMPRESSION: This study is suggestive of moderate diffuse encephalopathy.  No further seizures or epileptiform discharges were noted.  Dr. Iver Nestle was notified.   Anti-infectives: Anti-infectives (From admission, onward)    None        Assessment/Plan SBO vs ileus - CT 11/10 showed dilated small bowel with no transition zone more suspicious for ileus rather than true SBO - xray 11/14 with normal bowel gas pattern and old  contrast in the colon.  - Patient is passing air from ostomy, no stool. His abdomen is soft and he has no n/v. Ok to trial clear liquids from our standpoint if cleared by SLP for diet.   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 Everlene Farrier 06/16/22  ?Seizure - undergoing work up CHF HTN CAD COPD CKD H/o CVA with L hemiparesis  I reviewed hospitalist notes, last 24 h vitals and pain scores, last 48 h intake and output, and last 24 h labs and trends.    LOS: 2 days    Franne Forts, [facility] Surgery 07/20/2023, 8:41 AM Please see Amion for pager number during day hours 7:00am-4:30pm

## 2023-07-20 NOTE — Procedures (Signed)
Patient Name: DIANDRE DIMARTINO  MRN: [id number]  Epilepsy Attending: Charlsie Quest  Referring Physician/Provider: Caryl Pina, MD  Duration: 07/19/2023 1739 to 07/20/2023 1739   Patient history: 72yo M with seizure like activity getting eeg to evaluate for seizure   Level of alertness: Awake, asleep   AEDs during EEG study: LEV, VPA, LCM   Technical aspects: This EEG study was done with scalp electrodes positioned according to the 10-20 International system of electrode placement. Electrical activity was reviewed with band pass filter of 1-70Hz , sensitivity of 7 uV/mm, display speed of 11mm/sec with a 60Hz  notched filter applied as appropriate. EEG data were recorded continuously and digitally stored.  Video monitoring was available and reviewed as appropriate.   Description: The posterior dominant rhythm consists of 6-7 Hz activity of moderate voltage (25-35 uV) seen predominantly in posterior head regions, symmetric and reactive to eye opening and eye closing. Sleep was characterized by vertex waves, sleep spindles (12 to 14 Hz), maximal frontocentral region.  EEG showed continuous generalized predominantly 5 to 7 Hz theta slowing admixed with intermittent generalized 2 to 3 Hz delta slowing.  At times, there was 13-15 already fast activity noted right occipital region without any evolution.  Hyperventilation and photic stimulation were not performed.   ABNORMALITY - Continuous slow, generalized   IMPRESSION: This study is suggestive of moderate diffuse encephalopathy.  No seizures or definite epileptiform discharges were noted during the study.   Chane Magner Annabelle Harman

## 2023-07-20 NOTE — Plan of Care (Signed)
  Problem: Health Behavior/Discharge Planning: Goal: Ability to manage health-related needs will improve Outcome: Progressing   Problem: Clinical Measurements: Goal: Ability to maintain clinical measurements within normal limits will improve Outcome: Progressing Goal: Will remain free from infection Outcome: Progressing Goal: Diagnostic test results will improve Outcome: Progressing   Problem: Education: Goal: Knowledge of General Education information will improve Description: Including pain rating scale, medication(s)/side effects and non-pharmacologic comfort measures Outcome: Progressing   Problem: Health Behavior/Discharge Planning: Goal: Ability to manage health-related needs will improve Outcome: Progressing

## 2023-07-20 NOTE — Progress Notes (Addendum)
This chaplain responded to the unit consult for creating/updating the Pt. Advance Directive.   The chaplain reviewed the chart notes and received an update from the Pt. RN-Amanda before the visit. The medical team is at the bedside at the time of the visit. This chaplain will plan a revisit.  **1311 This chaplain is present with the Pt., Pt. brother-[name] and SIL-Carolyn. The Pt. is awake and intermittently responds to the chaplain's education about HCPOA with "I can make decisions for myself."  Eber Jones is familiar with HCPOA and assisted in affirming the importance of the Pt. designating a HCPOA. After answering  [name]'s questions about an AD, the chaplain and family decided to leave the incomplete AD in the Pt. room with chaplain F/U on Monday.   Chaplain [name] Acre [phone]

## 2023-07-20 NOTE — Progress Notes (Addendum)
LTM maint complete - no skin breakdown under: Fp1 Fp2 Fz  Power cord moved under trip pad. Study hours not was not recorded during outage time.   [facility] is NOT Patent attorney issues. Test button pressed  to ensure working accordingly.

## 2023-07-21 DIAGNOSIS — R569 Unspecified convulsions: Secondary | ICD-10-CM | POA: Diagnosis not present

## 2023-07-21 LAB — BASIC METABOLIC PANEL
Anion gap: 12 (ref 5–15)
BUN: 7 mg/dL — ABNORMAL LOW (ref 8–23)
CO2: 25 mmol/L (ref 22–32)
Calcium: 8.6 mg/dL — ABNORMAL LOW (ref 8.9–10.3)
Chloride: 102 mmol/L (ref 98–111)
Creatinine, Ser: 1.21 mg/dL (ref 0.61–1.24)
GFR, Estimated: >60 mL/min (ref >60)
Glucose, Bld: 74 mg/dL (ref 70–99)
Potassium: 3.7 mmol/L (ref 3.5–5.1)
Sodium: 139 mmol/L (ref 135–145)

## 2023-07-21 LAB — CBC WITH DIFFERENTIAL/PLATELET
Abs Immature Granulocytes: 0.03 10*3/uL (ref 0.00–0.07)
Basophils Absolute: 0 10*3/uL (ref 0.0–0.1)
Basophils Relative: 0 %
Eosinophils Absolute: 0 10*3/uL (ref 0.0–0.5)
Eosinophils Relative: 1 %
HCT: 37.6 % — ABNORMAL LOW (ref 39.0–52.0)
Hemoglobin: 11.7 g/dL — ABNORMAL LOW (ref 13.0–17.0)
Immature Granulocytes: 1 %
Lymphocytes Relative: 25 %
Lymphs Abs: 1 10*3/uL (ref 0.7–4.0)
MCH: 28.5 pg (ref 26.0–34.0)
MCHC: 31.1 g/dL (ref 30.0–36.0)
MCV: 91.5 fL (ref 80.0–100.0)
Monocytes Absolute: 0.4 10*3/uL (ref 0.1–1.0)
Monocytes Relative: 11 %
Neutro Abs: 2.4 10*3/uL (ref 1.7–7.7)
Neutrophils Relative %: 62 %
Platelets: 132 10*3/uL — ABNORMAL LOW (ref 150–400)
RBC: 4.11 MIL/uL — ABNORMAL LOW (ref 4.22–5.81)
RDW: 12.9 % (ref 11.5–15.5)
WBC: 3.8 10*3/uL — ABNORMAL LOW (ref 4.0–10.5)
nRBC: 0 % (ref 0.0–0.2)

## 2023-07-21 MED ORDER — ACETAMINOPHEN 325 MG PO TABS
650.0000 mg | ORAL_TABLET | Freq: Four times a day (QID) | ORAL | Status: DC | PRN
  Administered 2023-07-21 – 2023-07-22 (×2): 650 mg via ORAL
  Filled 2023-07-21: qty 2

## 2023-07-21 NOTE — Progress Notes (Signed)
Patient ID: Calvin [name], male   DOB: Sep 18, 1950, 72 y.o.   MRN: [id number] [facility] Surgery Progress Note     Subjective: No abdominal complaints this morning. He has air in his ostomy appliance, minimal liquid stool. Denies abdominal pain, nausea, or vomiting. Asking for chicken noodle soup.  Was given FLD yesterday and states he drank/ate that with no issues.  Objective: Vital signs in last 24 hours: Temp:  [97.4 F (36.3 C)-98.2 F (36.8 C)] 98 F (36.7 C) (11/16 0734) Pulse Rate:  [69-85] 81 (11/16 0734) Resp:  [16-20] 18 (11/16 0734) BP: (96-171)/(64-95) 115/67 (11/16 0734) SpO2:  [93 %-98 %] 95 % (11/16 0734) Last BM Date : 07/18/23  Intake/Output from previous day: 11/15 0701 - 11/16 0700 In: 370 [P.O.:370] Out: 1360 [Urine:1360] Intake/Output this shift: No intake/output data recorded.  PE: Gen:  Alert, NAD, undergoing EEG still Abd: soft, nondistended, bowel sounds present, nontender, Loop colostomy in left abdomen pink with air in bag/ small amount of liquid stool  Lab Results:  Recent Labs    07/20/23 1147 07/21/23 0448  WBC 4.1 3.8*  HGB 13.6 11.7*  HCT 41.9 37.6*  PLT 142* 132*   BMET Recent Labs    07/20/23 0508 07/21/23 0448  NA 138 139  K 3.4* 3.7  CL 104 102  CO2 23 25  GLUCOSE 90 74  BUN 5* 7*  CREATININE 1.22 1.21  CALCIUM 8.4* 8.6*   PT/INR No results for input(s): "LABPROT", "INR" in the last 72 hours. CMP     Component Value Date/Time   NA 139 07/21/2023 0448   NA 143 08/14/2014 0933   K 3.7 07/21/2023 0448   K 3.9 08/14/2014 0933   CL 102 07/21/2023 0448   CL 110 (H) 08/14/2014 0933   CO2 25 07/21/2023 0448   CO2 27 08/14/2014 0933   GLUCOSE 74 07/21/2023 0448   GLUCOSE 88 08/14/2014 0933   BUN 7 (L) 07/21/2023 0448   BUN 7 08/14/2014 0933   CREATININE 1.21 07/21/2023 0448   CREATININE 0.89 08/14/2014 0933   CALCIUM 8.6 (L) 07/21/2023 0448   CALCIUM 8.4 (L) 08/14/2014 0933   PROT 9.2 (H) 07/15/2023 0327    PROT 7.3 08/14/2014 0933   ALBUMIN 5.0 07/15/2023 0327   ALBUMIN 3.6 08/14/2014 0933   AST 16 07/15/2023 0327   AST 21 08/14/2014 0933   ALT 11 07/15/2023 0327   ALT 20 08/14/2014 0933   ALKPHOS 75 07/15/2023 0327   ALKPHOS 90 08/14/2014 0933   BILITOT 0.6 07/15/2023 0327   BILITOT 0.5 08/14/2014 0933   GFRNONAA >60 07/21/2023 0448   GFRNONAA >60 08/14/2014 0933   GFRNONAA >60 03/21/2014 2111   GFRAA 58 (L) 10/12/2019 0807   GFRAA >60 08/14/2014 0933   GFRAA >60 03/21/2014 2111   Lipase     Component Value Date/Time   LIPASE 37 07/15/2023 0327   LIPASE 155 08/27/2013 1545       Studies/Results: DG Abd Portable 1V  Result Date: 07/19/2023 CLINICAL DATA:  [id number] SBO (small bowel obstruction) (HCC) [id number] EXAM: PORTABLE ABDOMEN - 1 VIEW COMPARISON:  07/17/2023 FINDINGS: The bowel gas pattern is nonobstructive. Small amount of enteric contrast is present within the left colon. High attenuation structure is again seen within the central pelvis. No radio-opaque calculi or other significant radiographic abnormality are seen. IMPRESSION: Nonobstructive bowel gas pattern. Electronically Signed   By: Duanne Guess D.O.   On: 07/19/2023 15:24   Overnight EEG with video  Result Date: 07/19/2023 Charlsie Quest, MD     07/20/2023 10:05 AM [name] MRN: [id number] Epilepsy Attending: Charlsie Quest Referring Physician/Provider: Caryl Pina, MD Duration: 07/18/2023 1739 to 07/19/2023 1739  Patient history: 72yo M with seizure like activity getting eeg to evaluate for seizure  Level of alertness: Awake, asleep  AEDs during EEG study: LEV, VPA, PGB  Technical aspects: This EEG study was done with scalp electrodes positioned according to the 10-20 International system of electrode placement. Electrical activity was reviewed with band pass filter of 1-70Hz , sensitivity of 7 uV/mm, display speed of 23mm/sec with a 60Hz  notched filter applied as appropriate. EEG data were  recorded continuously and digitally stored.  Video monitoring was available and reviewed as appropriate.  Description: The posterior dominant rhythm consists of 6-7 Hz activity of moderate voltage (25-35 uV) seen predominantly in posterior head regions, symmetric and reactive to eye opening and eye closing. Sleep was characterized by vertex waves, sleep spindles (12 to 14 Hz), maximal frontocentral region.  EEG showed continuous generalized predominantly 5 to 7 Hz theta slowing admixed with intermittent generalized 2 to 3 Hz delta slowing. Hyperventilation and photic stimulation were not performed. On 07/19/2023 at 0637, 0639, 0641 and 0658, EEG showed spike and wave activity in left more than right occipital region which lasted for about 20 seconds each time without definite evolution.  Clinically, patient was noted to be laying in bed with eyes closed except at 0658 when he appeared to be mumbling something.  Patient does have history of seizures arising from this location but without clinical signs and definite evolution, these events are most likely interictal in nature.  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  On 07/19/2023 after 0637, spikes were noted more frequently without definite evolution.  This EEG pattern was on the ictal-interictal continuum with increased risk of seizures.  Additionally EEG suggestive of moderate diffuse encephalopathy.  Priyanka Annabelle Harman    Anti-infectives: Anti-infectives (From admission, onward)    None        Assessment/Plan SBO vs ileus - CT 11/10 showed dilated small bowel with no transition zone more suspicious for ileus rather than true SBO - xray 11/14 with normal bowel gas pattern and old contrast in the colon.  - Patient is passing air from ostomy, and small amount of liquid stool. His abdomen is  soft and he has no n/v.  - ok for diet per SLP recs. - no acute plans for surgical intervention.  We will be available as needed.   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 Everlene Farrier 06/16/22  ?Seizure - undergoing work up CHF HTN CAD COPD CKD H/o CVA with L hemiparesis  I reviewed hospitalist notes, last 24 h vitals and pain scores, last 48 h intake and output, and last 24 h labs and trends.    LOS: 3 days    Letha Cape, [facility] Surgery 07/21/2023, 7:54 AM Please see Amion for pager number during day hours 7:00am-4:30pm

## 2023-07-21 NOTE — Progress Notes (Signed)
LTM EEG discontinued - no skin breakdown at unhook.   

## 2023-07-21 NOTE — Plan of Care (Signed)
?  Problem: Clinical Measurements: ?Goal: Will remain free from infection ?Outcome: Progressing ?  ?

## 2023-07-21 NOTE — Progress Notes (Signed)
PROGRESS NOTE  Calvin [name]  DOB: March 02, 1951  PCP: Lucita Ferrara, MD GNF:[id number]  DOA: 07/18/2023  LOS: 3 days  Hospital Day: 4  Brief narrative: Calvin [name] is a 72 y.o. male who is a long-term resident at [facility] care facility at [place],  PMH significant for HTN, CAD, diastolic CHF, CKD, CVA with left hemiparesis, seizure disorder, COPD, h/o Ogilvie's syndrome, colon cancer s/p transverse colectomy, recurrent SBO with prior recent hospitalization 10/18-22, COPD 11/10, patient presented to ED at [facility] with abdominal pain CT abdomen showed diffusely fluid-filled and distended small bowel loops up to about 3.7 cm diameter in the jejunal region with no discrete transition zone.  SBO was suspected. General surgery was consulted.  Patient refused NG tube. Started on conservative management Patient was admitted to [facility]. Repeat x-ray KUB on 11/12 showed interval resolution of small bowel obstruction  However, On the morning of 11/12, patient was noted to have a seizure-like activity.  Per report, patient had generalized jerky movements that lasted for about 30 seconds, less focused eyes, somewhat sleepy afterwards but still speaking and communicating throughout the event.  CT head did not show any acute abnormality but showed multifocal encephalomalacia and asymmetric right hemispheric atrophy. Seen by neurology  EEG was obtained which showed focal seizure in the left parietal occipital region. MRI brain showed mildly restricted diffusion posteriorly in the right cerebral hemisphere which spans vascular territories and is favored to reflect recent seizure activity. 11/13, patient was transferred to was called for long-term EEG.  Subjective: Patient was seen and examined this morning. Much more awake.  Watching football on TV. Colostomy with liquid output  Assessment and plan: Complex partial seizure  H/o seizure disorder 11/12, patient had partial seizure while at  [facility].  EEG showed focal seizure in the left parietal-occipital region CT and MRI as above Transferred to [facility] for long-term EEG. Neurology following. Currently on Keppra, valproate, Lyrica, Lamictal Seizure precautions  SBO  h/o Ogilvie's syndrome, recurrent SBO H/o colon cancer s/p transverse colectomy, colostomy status Recently hospitalized 10/18-22 for SBO and managed with conservative approach. Admitted again with SBO. Was seen by general surgery at [facility].  Refused NG tube. Managed with conservative approach. 11/12, repeat KUB showed interval resolution of small bowel obstruction 11/14, general surgery was consulted.  Repeat KUB showed no evidence of bowel obstruction.  No further intervention required. 11/16, liquid output present in colostomy bag.  Able to tolerate diet.  Abdominal symptoms improving.  Dysphagia Seen by speech therapy.  Moderate risk of aspiration. Currently liquid diet.  AKI on CKD 2 Creatinine normal at baseline.  Present with creatinine elevated 1.84.  Improved with hydration. Continue to monitor Recent Labs    06/22/23 0409 06/23/23 0513 06/26/23 0444 07/15/23 0327 07/16/23 0443 07/17/23 0924 07/18/23 0328 07/19/23 0808 07/20/23 0508 07/21/23 0448  BUN 26* 22 11 16 10 11 14 9  5* 7*  CREATININE 1.42* 1.21 0.90 1.84* 1.08 1.14 1.23 1.13 1.22 1.21   L temporal pain Per hospitalist at [facility], patient described episodic stabbing pain in the L temporal area on 11/12- now along L posterior head and along L body Urgent ESR/CRP were obtained and were unremarkable, low suspicion for temporal arteritis. IV Tylenol f given for pain control.  Hospitalist at [facility] discussed this with vascular surgeon Dr. Wallace Cullens, who reported that >90% of temporal biopsies are negative and he would prefer to monitor clinically. Patient did not mention any concern of a left temporal pain on my  evaluation. Continue to monitor.  Chronic diastolic CHF Hypertension Blood pressure in  150s and 160s. PTA meds- metoprolol, lisinopril Currently continuing both.  CAD, HLD  No anginal symptoms currently. PTA meds- aspirin, statin.  Continue both.  H/o right CVA with residual left hemiparesis Anxiety/depression Continue aspirin and statin Supportive care. PTA meds- Celexa, baclofen, Lyrica.  Continue same.  COPD Respiratory status stable.  Continue as needed bronchodilators   Mobility: Seems bedbound, chronically??  Goals of care   Code Status: Full Code.  Palliative care consult appreciated.    DVT prophylaxis:  enoxaparin (LOVENOX) injection 40 mg Start: 07/19/23 1000   Antimicrobials: None Fluid: I will stop IV fluid Consultants: General Surgery Family Communication: None at bedside  Status: Inpatient Level of care:  Progressive   Patient is from: Long-term nursing facility Needs to continue in-hospital care: Improving.  Currently on liquid diet.   Anticipated d/c to: Will try to advance diet.  Hopefully back to facility tomorrow   Diet:  Diet Order             Diet full liquid Room service appropriate? No; Fluid consistency: Thin  Diet effective now                   Scheduled Meds:  aspirin  81 mg Oral Daily   baclofen  5 mg Oral BID   Chlorhexidine Gluconate Cloth  6 each Topical Q0600   citalopram  10 mg Oral Daily   divalproex  1,000 mg Oral BID   enoxaparin (LOVENOX) injection  40 mg Subcutaneous Q24H   lamoTRIgine  50 mg Oral BID   levETIRAcetam  1,000 mg Oral BID   lisinopril  5 mg Oral Daily   metoprolol succinate  25 mg Oral Daily   mupirocin ointment  1 Application Nasal BID   pregabalin  100 mg Oral QHS   pregabalin  50 mg Oral BID WC   simvastatin  20 mg Oral QHS   tamsulosin  0.4 mg Oral QPM   umeclidinium bromide  1 puff Inhalation Daily    PRN meds: LORazepam, morphine (PF)   Infusions:     Antimicrobials: Anti-infectives (From admission, onward)    None       Objective: Vitals:   07/21/23 0734  07/21/23 1106  BP: 115/67 109/66  Pulse: 81 98  Resp: 18 18  Temp: 98 F (36.7 C) 98.3 F (36.8 C)  SpO2: 95% 95%    Intake/Output Summary (Last 24 hours) at 07/21/2023 1135 Last data filed at 07/21/2023 0600 Gross per 24 hour  Intake 250 ml  Output 860 ml  Net -610 ml   Filed Weights   07/19/23 1125  Weight: 90 kg   Weight change:  Body mass index is 27.67 kg/m.   Physical Exam: General exam: Elderly African-American male.  Lying on bed.  Not in pain. Skin: No rashes, lesions or ulcers. HEENT: Atraumatic, normocephalic, no obvious bleeding Lungs: Shallow breathing.  Clear to auscultation bilaterally otherwise CVS: Regular rate and rhythm, no murmur GI/Abd: No tenderness.  Ostomy with liquid output. CNS: Alert, awake.  More awake today. Psychiatry: Sad affect Extremities: No pedal edema, no calf tenderness  Data Review: I have personally reviewed the laboratory data and studies available.  F/u labs ordered Unresulted Labs (From admission, onward)     Start     Ordered   07/20/23 0500  CBC with Differential/Platelet  Daily,   R     Question:  Specimen collection method  Answer:  Lab=Lab collect   07/19/23 1141   07/20/23 0500  Basic metabolic panel  Daily,   R     Question:  Specimen collection method  Answer:  Lab=Lab collect   07/19/23 1141            Total time spent in review of labs and imaging, patient evaluation, formulation of plan, documentation and communication with family: 45 minutes  Signed, Lorin Glass, MD [facility] 07/21/2023

## 2023-07-21 NOTE — Procedures (Addendum)
[name]  MRN: [id number]  Epilepsy Attending: Charlsie Quest  Referring Physician/Provider: Caryl Pina, MD  Duration: 07/20/2023 1739 to 07/21/2023 0914   Patient history: 72yo M with seizure like activity getting eeg to evaluate for seizure   Level of alertness: Awake, asleep   AEDs during EEG study: LEV, VPA, LTG, PGB   Technical aspects: This EEG study was done with scalp electrodes positioned according to the 10-20 International system of electrode placement. Electrical activity was reviewed with band pass filter of 1-70Hz , sensitivity of 7 uV/mm, display speed of 54mm/sec with a 60Hz  notched filter applied as appropriate. EEG data were recorded continuously and digitally stored.  Video monitoring was available and reviewed as appropriate.   Description: The posterior dominant rhythm consists of 6-7 Hz activity of moderate voltage (25-35 uV) seen predominantly in posterior head regions, symmetric and reactive to eye opening and eye closing. Sleep was characterized by vertex waves, sleep spindles (12 to 14 Hz), maximal frontocentral region.  EEG showed continuous generalized predominantly 5 to 7 Hz theta slowing admixed with intermittent generalized 2 to 3 Hz delta slowing.  At times, there was 13-15 already fast activity noted right occipital region without any evolution.  Hyperventilation and photic stimulation were not performed.   ABNORMALITY - Continuous slow, generalized   IMPRESSION: This study is suggestive of moderate diffuse encephalopathy.  No seizures or definite epileptiform discharges were noted during the study.   Calvin [name] Annabelle Harman

## 2023-07-22 DIAGNOSIS — R569 Unspecified convulsions: Secondary | ICD-10-CM | POA: Diagnosis not present

## 2023-07-22 LAB — CBC WITH DIFFERENTIAL/PLATELET
Abs Immature Granulocytes: 0.05 10*3/uL (ref 0.00–0.07)
Basophils Absolute: 0 10*3/uL (ref 0.0–0.1)
Basophils Relative: 0 %
Eosinophils Absolute: 0 10*3/uL (ref 0.0–0.5)
Eosinophils Relative: 0 %
HCT: 35.1 % — ABNORMAL LOW (ref 39.0–52.0)
Hemoglobin: 10.9 g/dL — ABNORMAL LOW (ref 13.0–17.0)
Immature Granulocytes: 1 %
Lymphocytes Relative: 32 %
Lymphs Abs: 1.4 10*3/uL (ref 0.7–4.0)
MCH: 28.7 pg (ref 26.0–34.0)
MCHC: 31.1 g/dL (ref 30.0–36.0)
MCV: 92.4 fL (ref 80.0–100.0)
Monocytes Absolute: 0.4 10*3/uL (ref 0.1–1.0)
Monocytes Relative: 10 %
Neutro Abs: 2.6 10*3/uL (ref 1.7–7.7)
Neutrophils Relative %: 57 %
Platelets: 136 10*3/uL — ABNORMAL LOW (ref 150–400)
RBC: 3.8 MIL/uL — ABNORMAL LOW (ref 4.22–5.81)
RDW: 13.2 % (ref 11.5–15.5)
WBC: 4.5 10*3/uL (ref 4.0–10.5)
nRBC: 0 % (ref 0.0–0.2)

## 2023-07-22 LAB — BASIC METABOLIC PANEL
Anion gap: 8 (ref 5–15)
BUN: 11 mg/dL (ref 8–23)
CO2: 27 mmol/L (ref 22–32)
Calcium: 8.5 mg/dL — ABNORMAL LOW (ref 8.9–10.3)
Chloride: 105 mmol/L (ref 98–111)
Creatinine, Ser: 1.5 mg/dL — ABNORMAL HIGH (ref 0.61–1.24)
GFR, Estimated: 49 mL/min — ABNORMAL LOW (ref >60)
Glucose, Bld: 87 mg/dL (ref 70–99)
Potassium: 3.4 mmol/L — ABNORMAL LOW (ref 3.5–5.1)
Sodium: 140 mmol/L (ref 135–145)

## 2023-07-22 LAB — GLUCOSE, CAPILLARY: Glucose-Capillary: 94 mg/dL (ref 70–99)

## 2023-07-22 MED ORDER — METOPROLOL SUCCINATE ER 25 MG PO TB24
12.5000 mg | ORAL_TABLET | Freq: Every day | ORAL | Status: DC
  Administered 2023-07-22 – 2023-07-24 (×3): 12.5 mg via ORAL
  Filled 2023-07-22 (×3): qty 1

## 2023-07-22 MED ORDER — DEXTROSE-SODIUM CHLORIDE 5-0.45 % IV SOLN: INTRAVENOUS | Status: AC

## 2023-07-22 NOTE — Progress Notes (Signed)
PROGRESS NOTE  Calvin [name]  DOB: [birth year]  PCP: Lucita Ferrara, MD OZH:[id number]  DOA: 07/18/2023  LOS: 4 days  Hospital Day: 5  Brief narrative: Calvin [name] is a 72 y.o. male who is a long-term resident at [facility] care facility at [place],  PMH significant for HTN, CAD, diastolic CHF, CKD, CVA with left hemiparesis, seizure disorder, COPD, h/o Ogilvie's syndrome, colon cancer s/p transverse colectomy, recurrent SBO with prior recent hospitalization 10/18-22, COPD 11/10, patient presented to ED at [facility] with abdominal pain CT abdomen showed diffusely fluid-filled and distended small bowel loops up to about 3.7 cm diameter in the jejunal region with no discrete transition zone.  SBO was suspected. General surgery was consulted.  Patient refused NG tube. Started on conservative management Patient was admitted to [facility]. Repeat x-ray KUB on 11/12 showed interval resolution of small bowel obstruction  However, On the morning of 11/12, patient was noted to have a seizure-like activity.  Per report, patient had generalized jerky movements that lasted for about 30 seconds, less focused eyes, somewhat sleepy afterwards but still speaking and communicating throughout the event.  CT head did not show any acute abnormality but showed multifocal encephalomalacia and asymmetric right hemispheric atrophy. Seen by neurology  EEG was obtained which showed focal seizure in the left parietal occipital region. MRI brain showed mildly restricted diffusion posteriorly in the right cerebral hemisphere which spans vascular territories and is favored to reflect recent seizure activity. 11/13, patient was transferred to was called for long-term EEG.  Subjective: Patient was seen and examined this morning. Awake, alert, has output in colostomy. Blood pressure running low and creatinine rising up.  Assessment and plan: Complex partial seizure  H/o seizure disorder 11/12, patient had partial  seizure while at [facility].  EEG showed focal seizure in the left parietal-occipital region CT and MRI as above Transferred to [facility] for long-term EEG. Neurology following. Currently on Keppra, valproate, Lyrica, Lamictal Seizure precautions  SBO  h/o Ogilvie's syndrome, recurrent SBO H/o colon cancer s/p transverse colectomy, colostomy status Recently hospitalized 10/18-22 for SBO and managed with conservative approach. Admitted again with SBO. Was seen by general surgery at [facility].  Refused NG tube. Managed with conservative approach. 11/12, repeat KUB showed interval resolution of small bowel obstruction 11/14, general surgery was consulted.  Repeat KUB showed no evidence of bowel obstruction.  No further intervention required. 11/16, liquid output present in colostomy bag.  Able to tolerate diet.  Abdominal symptoms improving.  Dysphagia Seen by speech therapy.  Moderate risk of aspiration. Currently liquid diet.  AKI on CKD 2 Creatinine normal at baseline.  Initially had AKI which improved However, labs this morning showed significant rise in creatinine.  Blood pressure running low as well. Meds adjusted.  Started on gentle hydration with D5 half NS at 75 mL/h. Recent Labs    06/23/23 0513 06/26/23 0444 07/15/23 0327 07/16/23 0443 07/17/23 0924 07/18/23 0328 07/19/23 0808 07/20/23 0508 07/21/23 0448 07/22/23 0322  BUN 22 11 16 10 11 14 9  5* 7* 11  CREATININE 6.29 0.90 1.84* 1.08 1.14 1.23 1.13 1.22 1.21 1.50*   L temporal pain Per hospitalist at [facility], patient described episodic stabbing pain in the L temporal area on 11/12- now along L posterior head and along L body Urgent ESR/CRP were obtained and were unremarkable, low suspicion for temporal arteritis. IV Tylenol f given for pain control.  Hospitalist at [facility] discussed this with vascular surgeon Dr. Wallace Cullens, who reported that >90%  of temporal biopsies are negative and he would prefer to monitor clinically. Patient did not  mention any concern of a left temporal pain on my evaluation. Continue to monitor.  Chronic diastolic CHF Hypertension Blood pressure in 150s and 160s. PTA meds- metoprolol, lisinopril Blood pressure running low, creatinine up.  Stop lisinopril.  Reduce dose of metoprolol Continue to monitor blood pressure  CAD, HLD  No anginal symptoms currently. PTA meds- aspirin, statin.  Continue both.  H/o right CVA with residual left hemiparesis Anxiety/depression Continue aspirin and statin Supportive care. PTA meds- Celexa, baclofen, Lyrica.  Continue same.  COPD Respiratory status stable.  Continue as needed bronchodilators   Mobility: Seems bedbound, chronically??  Goals of care   Code Status: Full Code.  Palliative care consult appreciated.    DVT prophylaxis:  enoxaparin (LOVENOX) injection 40 mg Start: 07/19/23 1000   Antimicrobials: None Fluid: Resume D5 half NS today Consultants: General Surgery Family Communication: None at bedside  Status: Inpatient Level of care:  Progressive   Patient is from: Long-term nursing facility Needs to continue in-hospital care: Creatinine rising up.  Resumed IV fluid today. Anticipated d/c to: Will try to advance diet.  Hopefully back to facility tomorrow   Diet:  Diet Order             Diet full liquid Room service appropriate? No; Fluid consistency: Thin  Diet effective now                   Scheduled Meds:  aspirin  81 mg Oral Daily   baclofen  5 mg Oral BID   Chlorhexidine Gluconate Cloth  6 each Topical Q0600   citalopram  10 mg Oral Daily   divalproex  1,000 mg Oral BID   enoxaparin (LOVENOX) injection  40 mg Subcutaneous Q24H   lamoTRIgine  50 mg Oral BID   levETIRAcetam  1,000 mg Oral BID   metoprolol succinate  12.5 mg Oral Daily   mupirocin ointment  1 Application Nasal BID   pregabalin  100 mg Oral QHS   pregabalin  50 mg Oral BID WC   simvastatin  20 mg Oral QHS   tamsulosin  0.4 mg Oral QPM    umeclidinium bromide  1 puff Inhalation Daily    PRN meds: acetaminophen, LORazepam, morphine (PF)   Infusions:   dextrose 5 % and 0.45 % NaCl       Antimicrobials: Anti-infectives (From admission, onward)    None       Objective: Vitals:   07/22/23 0556 07/22/23 0812  BP: (!) 91/55 106/60  Pulse: 63 68  Resp: 18 16  Temp: (!) 97.5 F (36.4 C) 98.2 F (36.8 C)  SpO2:  95%    Intake/Output Summary (Last 24 hours) at 07/22/2023 1127 Last data filed at 07/22/2023 0409 Gross per 24 hour  Intake 200 ml  Output 625 ml  Net -425 ml   Filed Weights   07/19/23 1125  Weight: 90 kg   Weight change:  Body mass index is 27.67 kg/m.   Physical Exam: General exam: Elderly African-American male.  Lying on bed.  Not in pain. Skin: No rashes, lesions or ulcers. HEENT: Atraumatic, normocephalic, no obvious bleeding Lungs: Shallow breathing.  Clear to auscultation bilaterally otherwise CVS: Regular rate and rhythm, no murmur GI/Abd: No tenderness.  Ostomy with liquid output. CNS: Alert, awake, watching TV, able to verbalize fluently.  Oriented to place and person Psychiatry: Mood appropriate Extremities: No pedal edema, no calf tenderness  Data Review: I have personally reviewed the laboratory data and studies available.  F/u labs ordered Unresulted Labs (From admission, onward)     Start     Ordered   07/23/23 0500  Basic metabolic panel  Daily,   R     Question:  Specimen collection method  Answer:  Lab=Lab collect   07/22/23 0801   07/23/23 0500  CBC with Differential/Platelet  Daily,   R     Question:  Specimen collection method  Answer:  Lab=Lab collect   07/22/23 0801   07/20/23 0500  CBC with Differential/Platelet  Daily,   R     Question:  Specimen collection method  Answer:  Lab=Lab collect   07/19/23 1141            Total time spent in review of labs and imaging, patient evaluation, formulation of plan, documentation and communication with family:  45 minutes  Signed, Lorin Glass, MD [facility] 07/22/2023

## 2023-07-22 NOTE — Plan of Care (Signed)
  Problem: Education: Goal: Knowledge of General Education information will improve Description: Including pain rating scale, medication(s)/side effects and non-pharmacologic comfort measures Outcome: Progressing   Problem: Activity: Goal: Risk for activity intolerance will decrease Outcome: Progressing   Problem: Nutrition: Goal: Adequate nutrition will be maintained Outcome: Progressing   Problem: Elimination: Goal: Will not experience complications related to bowel motility Outcome: Progressing Goal: Will not experience complications related to urinary retention Outcome: Progressing   Problem: Safety: Goal: Ability to remain free from injury will improve Outcome: Progressing   Problem: Pain Management: Goal: General experience of comfort will improve Outcome: Progressing

## 2023-07-22 NOTE — Plan of Care (Signed)
  Problem: Health Behavior/Discharge Planning: Goal: Ability to manage health-related needs will improve Outcome: Progressing   

## 2023-07-23 ENCOUNTER — Other Ambulatory Visit (HOSPITAL_COMMUNITY): Payer: Self-pay

## 2023-07-23 ENCOUNTER — Telehealth (HOSPITAL_COMMUNITY): Payer: Self-pay

## 2023-07-23 DIAGNOSIS — K56609 Unspecified intestinal obstruction, unspecified as to partial versus complete obstruction: Secondary | ICD-10-CM | POA: Diagnosis not present

## 2023-07-23 DIAGNOSIS — K5981 Ogilvie syndrome: Secondary | ICD-10-CM

## 2023-07-23 DIAGNOSIS — I69354 Hemiplegia and 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 side: Secondary | ICD-10-CM

## 2023-07-23 DIAGNOSIS — R569 Unspecified convulsions: Secondary | ICD-10-CM | POA: Diagnosis not present

## 2023-07-23 DIAGNOSIS — Z7189 Other specified counseling: Secondary | ICD-10-CM

## 2023-07-23 DIAGNOSIS — I4892 Unspecified atrial flutter: Secondary | ICD-10-CM

## 2023-07-23 DIAGNOSIS — Z789 Other specified health status: Secondary | ICD-10-CM

## 2023-07-23 DIAGNOSIS — Z711 Person with feared health complaint in whom no diagnosis is made: Secondary | ICD-10-CM

## 2023-07-23 LAB — CBC WITH DIFFERENTIAL/PLATELET
Abs Immature Granulocytes: 0.05 10*3/uL (ref 0.00–0.07)
Basophils Absolute: 0 10*3/uL (ref 0.0–0.1)
Basophils Relative: 1 %
Eosinophils Absolute: 0 10*3/uL (ref 0.0–0.5)
Eosinophils Relative: 1 %
HCT: 37.6 % — ABNORMAL LOW (ref 39.0–52.0)
Hemoglobin: 11.7 g/dL — ABNORMAL LOW (ref 13.0–17.0)
Immature Granulocytes: 1 %
Lymphocytes Relative: 18 %
Lymphs Abs: 0.8 10*3/uL (ref 0.7–4.0)
MCH: 29.3 pg (ref 26.0–34.0)
MCHC: 31.1 g/dL (ref 30.0–36.0)
MCV: 94 fL (ref 80.0–100.0)
Monocytes Absolute: 0.4 10*3/uL (ref 0.1–1.0)
Monocytes Relative: 9 %
Neutro Abs: 3.1 10*3/uL (ref 1.7–7.7)
Neutrophils Relative %: 70 %
Platelets: 138 10*3/uL — ABNORMAL LOW (ref 150–400)
RBC: 4 MIL/uL — ABNORMAL LOW (ref 4.22–5.81)
RDW: 13.2 % (ref 11.5–15.5)
WBC: 4.4 10*3/uL (ref 4.0–10.5)
nRBC: 0 % (ref 0.0–0.2)

## 2023-07-23 LAB — GLUCOSE, CAPILLARY
Glucose-Capillary: 102 mg/dL — ABNORMAL HIGH (ref 70–99)
Glucose-Capillary: 111 mg/dL — ABNORMAL HIGH (ref 70–99)
Glucose-Capillary: 129 mg/dL — ABNORMAL HIGH (ref 70–99)
Glucose-Capillary: 127 mg/dL — ABNORMAL HIGH (ref 70–99)
Glucose-Capillary: 104 mg/dL — ABNORMAL HIGH (ref 70–99)

## 2023-07-23 LAB — BASIC METABOLIC PANEL
Anion gap: 8 (ref 5–15)
BUN: 10 mg/dL (ref 8–23)
CO2: 25 mmol/L (ref 22–32)
Calcium: 8.2 mg/dL — ABNORMAL LOW (ref 8.9–10.3)
Chloride: 107 mmol/L (ref 98–111)
Creatinine, Ser: 1.27 mg/dL — ABNORMAL HIGH (ref 0.61–1.24)
GFR, Estimated: 59 mL/min — ABNORMAL LOW (ref >60)
Glucose, Bld: 102 mg/dL — ABNORMAL HIGH (ref 70–99)
Potassium: 3.3 mmol/L — ABNORMAL LOW (ref 3.5–5.1)
Sodium: 140 mmol/L (ref 135–145)

## 2023-07-23 MED ORDER — LISINOPRIL 2.5 MG PO TABS
5.0000 mg | ORAL_TABLET | Freq: Every day | ORAL | Status: DC
  Administered 2023-07-24: 5 mg via ORAL
  Filled 2023-07-23: qty 2

## 2023-07-23 MED ORDER — POTASSIUM CHLORIDE CRYS ER 20 MEQ PO TBCR
40.0000 meq | EXTENDED_RELEASE_TABLET | Freq: Two times a day (BID) | ORAL | Status: AC
  Administered 2023-07-23 (×2): 40 meq via ORAL
  Filled 2023-07-23 (×2): qty 2

## 2023-07-23 NOTE — Progress Notes (Signed)
This chaplain is present at the Pt. bedside for F/U spiritual care in the setting of creating an Advance Directive. The chaplain received an update from the Pt. RN-Claudia before the visit.  Family is not at the bedside.  The Pt. is awake and accepting of the chaplain's visit. The chaplain listens reflectively as the Pt. talks about his thoughts and the many ways [name] makes money. The Pt. 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  Today the Pt. accepts his role as medical decision and is able to articulate the possibility there may be a time the Pt. may not be able to make medical decisions for himself. The Pt. identifies daughter-[name] as his medical decision maker along with the Pt. brother-[name] and sister-[name]. The chaplain also learned the Pt. has three daughters.   The chaplain introduced the concept of naming one person as HCPOA. There was not movement in this direction from the Pt.  This chaplain is available for F/U spiritual care.  Chaplain [name] Acre [phone]

## 2023-07-23 NOTE — Care Management Important Message (Signed)
Important Message  Patient Details  Name: Calvin [name] MRN: [id number] Date of Birth: [birth year]   Important Message Given:  Yes - Medicare IM     Dorena Bodo 07/23/2023, 3:35 PM

## 2023-07-23 NOTE — Progress Notes (Signed)
[facility] PROGRESS NOTE    Progress Note  Calvin [name]  ZOX:[id number] DOB: 10/20/1950 DOA: 07/18/2023 PCP: Lucita Ferrara, MD     Brief Narrative:   Calvin [name] is an 72 y.o. male past medical history of hypertension chronic diastolic heart failure, CKD, CVA with left hemiparesis, seizure disorder history of acutely syndrome colon cancer status post transverse colectomy recurrent SBO presents on 07/15/2023 for abdominal pain CT scan abdomen and pelvis showed diffusely small bowel obstruction.  General surgery was consulted placed NG tube to intermittent suction and treated conservatively.  On the morning of 07/17/2023 had seizure-like activity lasted about 30 seconds, CT of the head showed no acute findings EEG showed focal seizures in the left parietal region MRI showed restricted diffusion posteriorly in the right cerebellar hemisphere.  On 07/18/2023 patient had a long-term EEG    Assessment/Plan:  Complex partial seizures/history of seizures: 07/17/2023 had a partial seizure while at [facility] EEG showed focal seizures. CT and MRI showed no acute findings transferred to [facility] for long-term EEG neurology was consulted recommended continue Keppra Depakote Lyrica and Lamictal.  Small bowel obstruction/history of Ogilvie syndrome/recurrent SBO: Treated conservatively SBO resolved Having liquid stool through ostomy bag denies abdominal pain.  Dysphagia: Moderate risk of aspiration, advance to full liquid diet.  Acute kidney injury on chronic kidney disease stage II: Likely prerenal resolved with IV fluids. KVO IV fluids.  Left temporal pain: ESR and CRP were unremarkable.  Given IV Tylenol. Has improved.  Chronic diastolic heart failure/essential hypertension: Lisinopril has been held due to rising renal function which is now improving. Continue metoprolol.  Blood pressure stable continue to monitor.  Hypokalemia: Replete orally recheck in the  morning.  CAD/hyperlipidemia: Continue aspirin and statins.  History of CVA, Continue aspirin and statin. Resume Celexa baclofen and Lyrica.  COPD: Continue inhalers.   DVT prophylaxis: lovenox Family Communication:none Status is: Inpatient Remains inpatient appropriate because: Complex partial seizures    Code Status:     Code Status Orders  (From admission, onward)           Start     Ordered   07/18/23 1740  Full code  Continuous       Question:  By:  Answer:  Other   07/18/23 1748           Code Status History     Date Active Date Inactive Code Status Order ID Comments User Context   07/15/2023 0538 07/18/2023 1551 Full Code [id number]  Andris Baumann, MD ED   06/22/2023 [phone] 06/27/2023 0108 Full Code [id number]  Emeline General, MD ED   05/28/2022 1622 06/30/2022 2326 Full Code [id number]  Lorretta Harp, MD ED   10/23/2021 2158 11/02/2021 2029 Full Code [id number]  Lajoyce Corners, NP Inpatient   10/16/2021 1144 10/23/2021 2158 DNR [id number]  Lorretta Harp, MD ED   10/02/2021 0731 10/02/2021 1641 DNR [id number]  Loleta Rose, MD ED   08/03/2019 2226 08/14/2019 2350 Full Code [id number]  Lurene Shadow, MD Inpatient   04/03/2019 1711 04/08/2019 1731 Full Code [id number]  Auburn Bilberry, MD Inpatient   04/03/2019 1627 04/03/2019 1710 DNR [id number]  Auburn Bilberry, MD ED   09/30/2017 2301 10/02/2017 2039 Full Code [id number]  Oralia Manis, MD Inpatient         IV Access:   Peripheral IV   Procedures and diagnostic studies:   No results found.   Medical Consultants:   None.   Subjective:    Calvin [name]  Fill no blades today feels good.  Objective:    Vitals:   07/22/23 2043 07/23/23 0018 07/23/23 0421 07/23/23 0834  BP: 124/71 (!) 103/57 (!) 105/56 (!) 118/56  Pulse: 68 60 67 64  Resp: 18 18 18 18   Temp: 97.7 F (36.5 C) 97.8 F (36.6 C) 97.6 F (36.4 C) 99 F (37.2 C)  TempSrc: Axillary Oral Oral Oral  SpO2: 99% 95% 96% 98%  Weight:      Height:        SpO2: 98 %   Intake/Output Summary (Last 24 hours) at 07/23/2023 1131 Last data filed at 07/23/2023 1045 Gross per 24 hour  Intake 1845.34 ml  Output 1580 ml  Net 265.34 ml   Filed Weights   07/19/23 1125  Weight: 90 kg    Exam: General exam: In no acute distress. Respiratory system: Good air movement and clear to auscultation. Cardiovascular system: S1 & S2 heard, RRR. No JVD. Gastrointestinal system: Abdomen is nondistended, soft and nontender.  Extremities: No pedal edema. Skin: No rashes, lesions or ulcers Psychiatry: Judgement and insight appear normal. Mood & affect appropriate.    Data Reviewed:    Labs: Basic Metabolic Panel: Recent Labs  Lab 07/17/23 0924 07/18/23 0328 07/19/23 0808 07/20/23 0508 07/21/23 0448 07/22/23 0322 07/23/23 0522  NA 137 137 137 138 139 140 140  K 4.2 3.9 3.3* 3.4* 3.7 3.4* 3.3*  CL 99 98 102 104 102 105 107  CO2 25 28 26 23 25 27 25   GLUCOSE 72 74 115* 90 74 87 102*  BUN 11 14 9  5* 7* 11 10  CREATININE 1.14 1.23 1.13 1.22 1.21 1.50* 1.27*  CALCIUM 9.1 8.9 8.7* 8.4* 8.6* 8.5* 8.2*  MG 1.5* 1.8  --   --   --   --   --    GFR Estimated Creatinine Clearance: 54.4 mL/min (A) (by C-G formula based on SCr of 1.27 mg/dL (H)). Liver Function Tests: No results for input(s): "AST", "ALT", "ALKPHOS", "BILITOT", "PROT", "ALBUMIN" in the last 168 hours. No results for input(s): "LIPASE", "AMYLASE" in the last 168 hours. Recent Labs  Lab 07/18/23 0328  AMMONIA <10   Coagulation profile No results for input(s): "INR", "PROTIME" in the last 168 hours. COVID-19 Labs  No results for input(s): "DDIMER", "FERRITIN", "LDH", "CRP" in the last 72 hours.  Lab Results  Component Value Date   SARSCOV2NAA NEGATIVE 05/28/2022   SARSCOV2NAA NEGATIVE 11/02/2021   SARSCOV2NAA NEGATIVE 10/31/2021   SARSCOV2NAA NEGATIVE 10/16/2021    CBC: Recent Labs  Lab 07/17/23 0924 07/18/23 0328 07/19/23 0808 07/20/23 1147 07/21/23 0448  07/22/23 0322 07/23/23 0522  WBC 5.8   < > 4.3 4.1 3.8* 4.5 4.4  NEUTROABS 4.2  --   --  2.6 2.4 2.6 3.1  HGB 13.0   < > 12.1* 13.6 11.7* 10.9* 11.7*  HCT 40.9   < > 37.2* 41.9 37.6* 35.1* 37.6*  MCV 91.1   < > 88.2 89.7 91.5 92.4 94.0  PLT 182   < > 148* 142* 132* 136* 138*   < > = values in this interval not displayed.   Cardiac Enzymes: Recent Labs  Lab 07/17/23 0924  CKTOTAL 59   BNP (last 3 results) No results for input(s): "PROBNP" in the last 8760 hours. CBG: Recent Labs  Lab 07/17/23 1020 07/17/23 1233 07/22/23 1647 07/23/23 0607 07/23/23 0853  GLUCAP 77 105* 94 102* 111*   D-Dimer: No results for input(s): "DDIMER" in the last 72 hours. Hgb  A1c: No results for input(s): "HGBA1C" in the last 72 hours. Lipid Profile: No results for input(s): "CHOL", "HDL", "LDLCALC", "TRIG", "CHOLHDL", "LDLDIRECT" in the last 72 hours. Thyroid function studies: No results for input(s): "TSH", "T4TOTAL", "T3FREE", "THYROIDAB" in the last 72 hours.  Invalid input(s): "FREET3" Anemia work up: No results for input(s): "VITAMINB12", "FOLATE", "FERRITIN", "TIBC", "IRON", "RETICCTPCT" in the last 72 hours. Sepsis Labs: Recent Labs  Lab 07/20/23 1147 07/21/23 0448 07/22/23 0322 07/23/23 0522  WBC 4.1 3.8* 4.5 4.4   Microbiology Recent Results (from the past 240 hour(s))  MRSA Next Gen by PCR, Nasal     Status: Abnormal   Collection Time: 07/17/23 12:48 PM   Specimen: Nasal Mucosa; Nasal Swab  Result Value Ref Range Status   MRSA by PCR Next Gen DETECTED (A) NOT DETECTED Final    Comment: RESULT CALLED TO, READ BACK BY AND VERIFIED WITH: Claudette Stapler 07/17/23 1403 MW (NOTE) The GeneXpert MRSA Assay (FDA approved for NASAL specimens only), is one component of a comprehensive MRSA colonization surveillance program. It is not intended to diagnose MRSA infection nor to guide or monitor treatment for MRSA infections. Test performance is not FDA approved in patients less than  12 years old. Performed at [facility], [address] [place], [place] [address]      Medications:    aspirin  81 mg Oral Daily   baclofen  5 mg Oral BID   Chlorhexidine Gluconate Cloth  6 each Topical Q0600   citalopram  10 mg Oral Daily   divalproex  1,000 mg Oral BID   enoxaparin (LOVENOX) injection  40 mg Subcutaneous Q24H   lamoTRIgine  50 mg Oral BID   levETIRAcetam  1,000 mg Oral BID   metoprolol succinate  12.5 mg Oral Daily   mupirocin ointment  1 Application Nasal BID   pregabalin  100 mg Oral QHS   pregabalin  50 mg Oral BID WC   simvastatin  20 mg Oral QHS   tamsulosin  0.4 mg Oral QPM   umeclidinium bromide  1 puff Inhalation Daily   Continuous Infusions:  dextrose 5 % and 0.45 % NaCl 75 mL/hr at 07/23/23 0712      LOS: 5 days   Marinda Elk  [facility]  07/23/2023, 11:31 AM

## 2023-07-23 NOTE — Telephone Encounter (Signed)
Pharmacy Patient Advocate Encounter   Received notification from  MD  that prior authorization for Valtoco 20 MG Dose 10MG /0.1ML liquid is required/requested.   Insurance verification completed.   The patient is insured through [facility] Medicare Part D .   Per test claim: PA required; PA submitted to above mentioned insurance via CoverMyMeds Key/confirmation #/EOC [id number] Status is pending

## 2023-07-23 NOTE — Plan of Care (Signed)
  Problem: Education: Goal: Knowledge of General Education information will improve Description: Including pain rating scale, medication(s)/side effects and non-pharmacologic comfort measures Outcome: Progressing   Problem: Activity: Goal: Risk for activity intolerance will decrease Outcome: Progressing   Problem: Elimination: Goal: Will not experience complications related to bowel motility Outcome: Progressing Goal: Will not experience complications related to urinary retention Outcome: Progressing   Problem: Nutrition: Goal: Adequate nutrition will be maintained Outcome: Progressing   Problem: Safety: Goal: Ability to remain free from injury will improve Outcome: Progressing

## 2023-07-23 NOTE — NC FL2 (Signed)
[place] MEDICAID FL2 LEVEL OF CARE FORM     IDENTIFICATION  [name] Birthdate: 11-12-1950 Sex: male Admission Date (Current Location): 07/18/2023  [facility] and [place] Number:  Chiropodist and Address:  [facility] [place]. [facility], [address], [place], [place] [address]      Provider Number: [id number]  Attending Physician Name and Address:  Marinda Elk, MD  Relative Name and Phone Number:       Current Level of Care: Hospital Recommended Level of Care: Skilled Nursing Facility Prior Approval Number:    Date Approved/Denied:   PASRR Number:    Discharge Plan: SNF    Current Diagnoses: Patient Active Problem List   Diagnosis Date Noted   Hypotension 07/15/2023   Small bowel obstruction (HCC) 07/15/2023   Hemiplegia and 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 side (HCC)    Temporal pain 06/18/2022   AKI (acute kidney injury) (HCC) 06/17/2022   Dysphagia 06/07/2022   Respiratory distress 06/02/2022   Neuropathy 06/02/2022   Paroxysmal atrial flutter (HCC) 06/01/2022   Aspiration pneumonia (HCC) 06/01/2022   Cervical spinal stenosis 05/31/2022   Chronic diastolic CHF (congestive heart failure) (HCC) 05/28/2022   SBO, recurrent (small bowel obstruction) (HCC)    Abdominal distention    HLD (hyperlipidemia) 10/16/2021   Nausea vomiting and diarrhea 10/16/2021   Sepsis (HCC) 10/16/2021   Tobacco abuse 10/16/2021   Muscle twitching 08/14/2019   UTI (urinary tract infection) 08/03/2019   Ileus (HCC) 08/03/2019   Hypokalemia 08/03/2019   QT prolongation 08/03/2019   Ogilvie syndrome    Acute abdominal pain 04/03/2019   Left-sided weakness 09/30/2017   Seizure (HCC) 09/30/2017   HTN (hypertension) 09/30/2017   CAD (coronary artery disease) 09/30/2017   COPD (chronic obstructive pulmonary disease) (HCC) 09/30/2017   Depression with anxiety 09/30/2017    Orientation RESPIRATION  BLADDER Height & Weight     Self, Time  Normal Incontinent Weight: 198 lb 6.6 oz (90 kg) Height:  5\' 11"  (180.3 cm)  BEHAVIORAL SYMPTOMS/MOOD NEUROLOGICAL BOWEL NUTRITION STATUS    Convulsions/Seizures Colostomy Diet (see DC summary)  AMBULATORY STATUS COMMUNICATION OF NEEDS Skin   Extensive Assist Verbally Normal                       Personal Care Assistance Level of Assistance  Bathing, Feeding, Dressing Bathing Assistance: Maximum assistance Feeding assistance: Limited assistance Dressing Assistance: Maximum assistance     Functional Limitations Info             SPECIAL CARE FACTORS FREQUENCY                       Contractures Contractures Info: Not present    Additional Factors Info  Code Status, Allergies Code Status Info: Full Allergies Info: NKA           Current Medications (07/23/2023):  This is the current hospital active medication list Current Facility-Administered Medications  Medication Dose Route Frequency Provider Last Rate Last Admin   acetaminophen (TYLENOL) tablet 650 mg  650 mg Oral Q6H PRN Darlin Drop, DO   650 mg at 07/22/23 2119   aspirin chewable tablet 81 mg  81 mg Oral Daily Kathrynn Running, MD   81 mg at 07/23/23 0834   baclofen (LIORESAL) tablet 5 mg  5 mg Oral BID Lorin Glass, MD   5 mg at 07/23/23 1324   Chlorhexidine Gluconate Cloth  2 % PADS 6 each  6 each Topical Q0600 Dahal, Melina Schools, MD   6 each at 07/23/23 0535   citalopram (CELEXA) tablet 10 mg  10 mg Oral Daily Kathrynn Running, MD   10 mg at 07/23/23 0834   divalproex (DEPAKOTE) DR tablet 1,000 mg  1,000 mg Oral BID Charlsie Quest, MD   1,000 mg at 07/23/23 0833   enoxaparin (LOVENOX) injection 40 mg  40 mg Subcutaneous Q24H Kathrynn Running, MD   40 mg at 07/23/23 4540   lamoTRIgine (LAMICTAL) tablet 50 mg  50 mg Oral BID Kathrynn Running, MD   50 mg at 07/23/23 0834   levETIRAcetam (KEPPRA) tablet 1,000 mg  1,000 mg Oral BID Charlsie Quest, MD    1,000 mg at 07/23/23 0834   lisinopril (ZESTRIL) tablet 5 mg  5 mg Oral Daily Marinda Elk, MD       LORazepam (ATIVAN) injection 4 mg  4 mg Intravenous Once PRN Wouk, Wilfred Curtis, MD       metoprolol succinate (TOPROL-XL) 24 hr tablet 12.5 mg  12.5 mg Oral Daily Dahal, Binaya, MD   12.5 mg at 07/23/23 0834   morphine (PF) 2 MG/ML injection 2 mg  2 mg Intravenous Q4H PRN Kathrynn Running, MD   2 mg at 07/21/23 1902   mupirocin ointment (BACTROBAN) 2 % 1 Application  1 Application Nasal BID Lorin Glass, MD   1 Application at 07/23/23 0833   potassium chloride SA (KLOR-CON M) CR tablet 40 mEq  40 mEq Oral BID Marinda Elk, MD   40 mEq at 07/23/23 1349   pregabalin (LYRICA) capsule 100 mg  100 mg Oral QHS Kathrynn Running, MD   100 mg at 07/22/23 2119   pregabalin (LYRICA) capsule 50 mg  50 mg Oral BID WC Kathrynn Running, MD   50 mg at 07/23/23 0834   simvastatin (ZOCOR) tablet 20 mg  20 mg Oral QHS Kathrynn Running, MD   20 mg at 07/22/23 2119   tamsulosin (FLOMAX) capsule 0.4 mg  0.4 mg Oral QPM Kathrynn Running, MD   0.4 mg at 07/22/23 1724   umeclidinium bromide (INCRUSE ELLIPTA) 62.5 MCG/ACT 1 puff  1 puff Inhalation Daily Kathrynn Running, MD   1 puff at 07/23/23 9811     Discharge Medications: Please see discharge summary for a list of discharge medications.  Relevant Imaging Results:  Relevant Lab Results:   Additional Information SS#: [id number]  Baldemar Lenis, LCSW

## 2023-07-23 NOTE — Progress Notes (Signed)
Subjective: No acute events overnight.  Denies any concerns.  ROS: negative except above  Examination  Vital signs in last 24 hours: Temp:  [97.3 F (36.3 C)-99 F (37.2 C)] 97.3 F (36.3 C) (11/18 1224) Pulse Rate:  [60-76] 76 (11/18 1224) Resp:  [18] 18 (11/18 0834) BP: (103-124)/(56-71) 112/61 (11/18 1224) SpO2:  [95 %-99 %] 96 % (11/18 1224)  General: lying in bed, NAD Neuro: Awake, alert, oriented to time place and person, follows commands, pupils equally round and reactive, left hemianopia appears to be improving, 5/5 in right upper extremity, 3/5 in left upper extremity, 3/5 in right lower extremity, 2/5 in left lower extremity  Basic Metabolic Panel: Recent Labs  Lab 07/17/23 0924 07/18/23 0328 07/19/23 0808 07/20/23 0508 07/21/23 0448 07/22/23 0322 07/23/23 0522  NA 137 137 137 138 139 140 140  K 4.2 3.9 3.3* 3.4* 3.7 3.4* 3.3*  CL 99 98 102 104 102 105 107  CO2 25 28 26 23 25 27 25   GLUCOSE 72 74 115* 90 74 87 102*  BUN 11 14 9  5* 7* 11 10  CREATININE 1.14 1.23 1.13 1.22 1.21 1.50* 1.27*  CALCIUM 9.1 8.9 8.7* 8.4* 8.6* 8.5* 8.2*  MG 1.5* 1.8  --   --   --   --   --     CBC: Recent Labs  Lab 07/17/23 0924 07/18/23 0328 07/19/23 0808 07/20/23 1147 07/21/23 0448 07/22/23 0322 07/23/23 0522  WBC 5.8   < > 4.3 4.1 3.8* 4.5 4.4  NEUTROABS 4.2  --   --  2.6 2.4 2.6 3.1  HGB 13.0   < > 12.1* 13.6 11.7* 10.9* 11.7*  HCT 40.9   < > 37.2* 41.9 37.6* 35.1* 37.6*  MCV 91.1   < > 88.2 89.7 91.5 92.4 94.0  PLT 182   < > 148* 142* 132* 136* 138*   < > = values in this interval not displayed.     Coagulation Studies: No results for input(s): "LABPROT", "INR" in the last 72 hours.  Imaging No new brain imaging overnight   ASSESSMENT AND PLAN:  72 year old male with prior stroke, epilepsy admitted with breakthrough seizures   Epilepsy with breakthrough seizure -Per chart review patient was supposed to be on Keppra 750 mg twice daily and lamotrigine 50 mg  as well as pregabalin for neuropathy.   Recommendations -Continue lamotrigine Pregabalin, Keppra and Depakote at current doses.  Of note, Depakote and lamotrigine have significant interactions.  Therefore, can try to wean off Depakote and increase lamotrigine in future to avoid polypharmacy -History medication at the time of discharge: Intranasal Valtoco 20 mg for seizure lasting more than 2 minutes.  Prior Auth approved by insurance with $0 co-pay -Continue seizure precautions including do not drive -Follow-up with [facility] clinic neurology in 3 months after discharge -as needed IV Versed for clinical seizures -Management of rest of comorbidities per primary team  Seizure precautions: Per [name] statutes, patients with seizures are not allowed to drive until they have been seizure-free for six months and cleared by a physician    Use caution when using heavy equipment or power tools. Avoid working on ladders or at heights. Take showers instead of baths. Ensure the water temperature is not too high on the home water heater. Do not go swimming alone. Do not lock yourself in a room alone (i.e. bathroom). When caring for infants or small children, sit down when holding, feeding, or changing them to minimize risk of injury  to the child in the event you have a seizure. Maintain good sleep hygiene. Avoid alcohol.    If patient has another seizure, call 911 and bring them back to the ED if: A.  The seizure lasts longer than 5 minutes.      B.  The patient doesn't wake shortly after the seizure or has new problems such as difficulty seeing, speaking or moving following the seizure C.  The patient was injured during the seizure D.  The patient has a temperature over 102 F (39C) E.  The patient vomited during the seizure and now is having trouble breathing    During the Seizure   - First, ensure adequate ventilation and place patients on the floor on their left side  Loosen clothing around  the neck and ensure the airway is patent. If the patient is clenching the teeth, do not force the mouth open with any object as this can cause severe damage - Remove all items from the surrounding that can be hazardous. The patient may be oblivious to what's happening and may not even know what he or she is doing. If the patient is confused and wandering, either gently guide him/her away and block access to outside areas - Reassure the individual and be comforting - Call 911. In most cases, the seizure ends before EMS arrives. However, there are cases when seizures may last over 3 to 5 minutes. Or the individual may have developed breathing difficulties or severe injuries. If a pregnant patient or a person with diabetes develops a seizure, it is prudent to call an ambulance.    After the Seizure (Postictal Stage)   After a seizure, most patients experience confusion, fatigue, muscle pain and/or a headache. Thus, one should permit the individual to sleep. For the next few days, reassurance is essential. Being calm and helping reorient the person is also of importance.   Most seizures are painless and end spontaneously. Seizures are not harmful to others but can lead to complications such as stress on the lungs, brain and the heart. Individuals with prior lung problems may develop labored breathing and respiratory distress.    I have spent a total of 36 minutes with the patient reviewing hospital notes,  test results, labs and examining the patient as well as establishing an assessment and plan that was discussed personally with the patient.  > 50% of time was spent in direct patient care.      Lindie Spruce Epilepsy [facility] For questions after 5pm please refer to AMION to reach the Neurologist on call

## 2023-07-23 NOTE — TOC Progression Note (Signed)
Transition of Care [facility]) - Progression Note    Patient Details  Name: Calvin [name] MRN: [id number] Date of Birth: 1951-05-25  Transition of Care [facility]) CM/SW Contact  Baldemar Lenis, [place] Phone Number: 07/23/2023, 2:03 PM  Clinical Narrative:   CSW following for disposition. Per MD, hopeful to return to [facility] tomorrow. CSW notified [facility], they are ready for patient to return. Patient will need outpatient palliative referral. [facility] has a contract with [facility] for palliative care. CSW contacted [facility] to provide referral, they will follow.    Expected Discharge Plan: Skilled Nursing Facility Barriers to Discharge: Continued Medical Work up  Expected Discharge Plan and Services                                               Social Determinants of Health (SDOH) Interventions SDOH Screenings   Food Insecurity: No Food Insecurity (07/19/2023)  Housing: Low Risk  (07/19/2023)  Transportation Needs: No Transportation Needs (07/19/2023)  Utilities: Not At Risk (07/19/2023)  Tobacco Use: Medium Risk (07/19/2023)    Readmission Risk Interventions    07/17/2023   12:24 PM 06/30/2022    2:25 PM 06/02/2022   12:02 PM  Readmission Risk Prevention Plan  Transportation Screening Complete Complete Complete  PCP or Specialist Appt within 3-5 Days Complete    Social Work Consult for Recovery Care Planning/Counseling Complete    Palliative Care Screening Not Applicable    Medication Review [employer]) Complete  Complete  PCP or Specialist appointment within 3-5 days of discharge   Complete  HRI or Home Care Consult  Complete Complete  SW Recovery Care/Counseling Consult  Complete Complete  Palliative Care Screening  Complete Complete  Skilled Nursing Facility  Complete Complete

## 2023-07-23 NOTE — Plan of Care (Signed)

## 2023-07-23 NOTE — Progress Notes (Signed)
Daily Progress Note   [name]       Date: 07/23/2023 DOB: [birth year]  Age: 72 y.o. MRN#: [id number] Attending Physician: Marinda Elk, MD Primary Care Physician: Lucita Ferrara, MD Admit Date: 07/18/2023  Reason for Consultation/Follow-up: Establishing goals of care  Subjective: I have reviewed medical records including EPIC notes, MAR, and labs. Received report from primary RN - no acute concerns. RN reports patient ate 100% of breakfast this morning with feeding assistance. RN reports patient remains confused at times.  Went to visit patient at bedside - no family/visitors present. Patient was lying in bed awake, alert, seemingly oriented x4; however, he is easily distracted  and redirectable. No signs or non-verbal gestures of pain or discomfort noted. No respiratory distress, increased work of breathing, or secretions noted.   Emotional support provided to patient. He understands plan is for his discharge back to [facility] tomorrow, which he is hopeful for. Therapeutic listening provided as patient tells me, "everyone knows me there." He indicates he has been at [facility] for 5 years and he enjoys the connections he has made there.   Attempted to discuss code status again - today, patient indicates his desire for a natural and peaceful passing without aggressive interventions, saying "let me go ahead if it's my time." His thoughts today are different than previous PMT discussions and MOST form on file from 2023. Noted patient also has a DNR form on file from 2019. He is currently listed as full code this admission and several previous admissions since 2019. Due to wax/waning mental status will not change code status today without discussing with family and an attempt to  confirm with patient on a later date.   Patient expressed appreciation for PMT visit today.  1:12 PM Called patient's daughter/[name] - emotional support provided. She asks if PMT is the same as hospice.   Discussion on the difference between Palliative and Hospice care. Palliative care and hospice have similar goals of managing symptoms, promoting comfort, improving quality of life, and maintaining a person's dignity. However, palliative care may be offered during any phase of a serious illness, while hospice care is usually offered when a person is expected to live for 6 months or less.  [name] expressed appreciation for PMT vs hospice education and is clear she is not  ready for patient's transition to hospice care. She understands he remains at high risk for rehospitalization without hospice care. [name] confirms goals for his discharge back to [facility] (also confirms his residency there of 5 years). Outpatient Palliative Care explained and offered - she is agreeable and appreciative.  She has questions regarding patient's anticipated mental recovery trajectory - reviewed in detail. She also requests to speak with Dr. David Stall prior to patient's discharge - notified him of her request.   Answered questions in detail regarding HCPOA. Explained that chaplain has been to see patient today and he remains unable to verbalize what HCPOA document is for or one specific surrogate decision maker; thus, unfortunately, would not be able for him to sign a legal document without evidence of full understanding. Per [name], patient does have four children, two of which are estranged. According to her, her brother/[name] has deferred all medical decisions to her - reviewed that all medical decisions would fall to her in this instance with/without HCPOA document; however, medical team, if needed in the future, would need to confirm that with Jomarie Longs. She expressed understanding.   Reviewed  code status discussion had with patient today - will continue full code at this time.  All questions and concerns addressed. Encouraged to call with questions and/or concerns. PMT number provided via text per her request.  Length of Stay: 5  Current Medications: Scheduled Meds:   aspirin  81 mg Oral Daily   baclofen  5 mg Oral BID   Chlorhexidine Gluconate Cloth  6 each Topical Q0600   citalopram  10 mg Oral Daily   divalproex  1,000 mg Oral BID   enoxaparin (LOVENOX) injection  40 mg Subcutaneous Q24H   lamoTRIgine  50 mg Oral BID   levETIRAcetam  1,000 mg Oral BID   lisinopril  5 mg Oral Daily   metoprolol succinate  12.5 mg Oral Daily   mupirocin ointment  1 Application Nasal BID   potassium chloride  40 mEq Oral BID   pregabalin  100 mg Oral QHS   pregabalin  50 mg Oral BID WC   simvastatin  20 mg Oral QHS   tamsulosin  0.4 mg Oral QPM   umeclidinium bromide  1 puff Inhalation Daily    Continuous Infusions:   PRN Meds: acetaminophen, LORazepam, morphine (PF)  Physical Exam Vitals and nursing note reviewed.  Constitutional:      General: He is not in acute distress. Pulmonary:     Effort: No respiratory distress.  Skin:    General: Skin is warm and dry.  Neurological:     Mental Status: He is alert and oriented to person, place, and time.     Comments: Wax/waning mental status  Psychiatric:        Attention and Perception: Attention normal.        Behavior: Behavior is cooperative.        Cognition and Memory: Cognition and memory normal.             Vital Signs: BP 112/61   Pulse 76   Temp (!) 97.3 F (36.3 C) (Oral)   Resp 18   Ht 5\' 11"  (1.803 m)   Wt 90 kg   SpO2 96%   BMI 27.67 kg/m  SpO2: SpO2: 96 % O2 Device: O2 Device: Room Air O2 Flow Rate:    Intake/output summary:  Intake/Output Summary (Last 24 hours) at 07/23/2023 1244 Last data filed at 07/23/2023 1241 Gross per 24 hour  Intake 2256.59 ml  Output 1580 ml  Net 676.59 ml   LBM:  Last BM Date : 07/23/23 Baseline Weight: Weight: 90 kg Most recent weight: Weight: 90 kg       Palliative Assessment/Data: PPS 30-40%      Patient Active Problem List   Diagnosis Date Noted   Hypotension 07/15/2023   Small bowel obstruction (HCC) 07/15/2023   Hemiplegia and 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 side (HCC)    Temporal pain 06/18/2022   AKI (acute kidney injury) (HCC) 06/17/2022   Dysphagia 06/07/2022   Respiratory distress 06/02/2022   Neuropathy 06/02/2022   Paroxysmal atrial flutter (HCC) 06/01/2022   Aspiration pneumonia (HCC) 06/01/2022   Cervical spinal stenosis 05/31/2022   Chronic diastolic CHF (congestive heart failure) (HCC) 05/28/2022   SBO, recurrent (small bowel obstruction) (HCC)    Abdominal distention    HLD (hyperlipidemia) 10/16/2021   Nausea vomiting and diarrhea 10/16/2021   Sepsis (HCC) 10/16/2021   Tobacco abuse 10/16/2021   Muscle twitching 08/14/2019   UTI (urinary tract infection) 08/03/2019   Ileus (HCC) 08/03/2019   Hypokalemia 08/03/2019   QT prolongation 08/03/2019   Ogilvie syndrome    Acute abdominal pain 04/03/2019   Left-sided weakness 09/30/2017   Seizure (HCC) 09/30/2017   HTN (hypertension) 09/30/2017   CAD (coronary artery disease) 09/30/2017   COPD (chronic obstructive pulmonary disease) (HCC) 09/30/2017   Depression with anxiety 09/30/2017    Palliative Care Assessment & Plan   Patient Profile: 72 y.o. 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  Patient was transferred from [facility] for long-term EEG monitoring.   Patient suspected to have SBO  versus ileus.  Diet is advanced to clears.  Patient has gas in ostomy but has not had a bowel movement since admission.   Patient has refused NG tube placement despite abdominal distention and vomiting.   EEG in progress during my visit.    PMT was consulted to discuss goals of care.   Of note, patient is removed to our service as we have followed him in previous hospitalizations.   Assessment: Principal Problem:   Seizure (HCC) Active Problems:   HTN (hypertension)   CAD (coronary artery disease)   COPD (chronic obstructive pulmonary disease) (HCC)   Ogilvie syndrome   Ileus (HCC)   SBO, recurrent (small bowel obstruction) (HCC)   Paroxysmal atrial flutter (HCC)   Hemiplegia and 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 side (HCC)   Recommendations/Plan: Continue to treat the treatable Continue full code  Goal is for patient's discharge back to [facility] with outpatient Palliative Care to follow [place] notified and consulted for: outpatient Palliative Care referral PMT will continue to follow peripherally. If there are any imminent needs please call the service directly  Goals of Care and Additional Recommendations: Limitations on Scope of Treatment: Full Scope Treatment  Code Status:    Code Status Orders  (From admission, onward)           Start     Ordered   07/18/23 1740  Full code  Continuous       Question:  By:  Answer:  Other   07/18/23 1748  Code Status History     Date Active Date Inactive Code Status Order ID Comments User Context   07/15/2023 0538 07/18/2023 1551 Full Code [id number]  Andris Baumann, MD ED   06/22/2023 [phone] 06/27/2023 0108 Full Code [id number]  Emeline General, MD ED   05/28/2022 1622 06/30/2022 2326 Full Code [id number]  Lorretta Harp, MD ED   10/23/2021 2158 11/02/2021 2029 Full Code [id number]  Lajoyce Corners, NP Inpatient   10/16/2021 1144 10/23/2021 2158 DNR [id number]  Lorretta Harp, MD ED   10/02/2021  0731 10/02/2021 1641 DNR [id number]  Loleta Rose, MD ED   08/03/2019 2226 08/14/2019 2350 Full Code [id number]  Lurene Shadow, MD Inpatient   04/03/2019 1711 04/08/2019 1731 Full Code [id number]  Auburn Bilberry, MD Inpatient   04/03/2019 1627 04/03/2019 1710 DNR [id number]  Auburn Bilberry, MD ED   09/30/2017 2301 10/02/2017 2039 Full Code [id number]  Oralia Manis, MD Inpatient       Prognosis:  Poor long term in the setting of advanced age, recurrent hospitalizations, and multiple comorbidities  Discharge Planning: Skilled Nursing Facility for rehab with Palliative care service follow-up  Care plan was discussed with primary RN, patient, patient's daughter, Dr. David Stall, [facility]  Thank you for allowing the Palliative Medicine Team to assist in the care of this patient.   Total Time 55 minutes Prolonged Time Billed  no       Haskel Khan, NP  Please contact Palliative Medicine Team phone at [phone] for questions and concerns.   *Portions of this note are a verbal dictation therefore any spelling and/or grammatical errors are due to the "[employer]agon [employer] One" system interpretation.

## 2023-07-23 NOTE — Telephone Encounter (Signed)
Pharmacy Patient Advocate Encounter  Received notification from [facility] Medicare Part D that Prior Authorization for Valtoco 20 MG Dose 10MG /0.1ML liquid has been APPROVED from 07-23-2023 to 09-03-2024. Ran test claim, Copay is $0.00 for quantity of 2 pens per 6 days. This test claim was processed through [facility]- copay amounts may vary at other pharmacies due to pharmacy/plan contracts, or as the patient moves through the different stages of their insurance plan.   PA #/Case ID/Reference #: [id number]

## 2023-07-23 NOTE — Progress Notes (Addendum)
Speech Language Pathology Treatment: Dysphagia  Patient Details Name: Calvin [name] MRN: [id number] DOB: December 20, 1950 Today's Date: 07/23/2023 Time: 6578-4696 SLP Time Calculation (min) (ACUTE ONLY): 10 min  Assessment / Plan / Recommendation Clinical Impression  Pt much more alert than prior sessions. He demonstrated understanding of FEES and chronic dysphagia. Observed pt with trials of thin liquids and purees with immediate coughing and throat clearing throughout, which was noted to be effective in clearing penetrates/aspirates. A liquid wash was also effective at clearing residue. Provided education regarding use of compensatory strategies including a liquid wash and intermittent hard coughs. Recommend upgrading diet to Dys 1 textures with thin liquids and full supervision to use a liquid wash and to cough intermittently. Expect him to continue spontaneously coughing a great deal. SLP will f/u.    HPI HPI: [name] is a 72 yo male presenting to [facility] ED 11/10 with sudden onset of vomiting, abdominal pain, and distension. Recently admitted 10/18-10/22 with SBO. Transferred to [facility] with concern for seizure activity 11/13. 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 Most recent MBS 10/30/22 reveals moderate oropharyngeal dysphagia with penetration and eventual aspiration of thin and nectar thick liquids as well as solids. Per note, "A cued throat clear (vs cough) was effective in ejecting penetration, however did not clear aspiration, and due to pt's positioning (head forward), this positions his pyriform sinuses directly above open airway, with residue continuing to mix with secretions and trickle into the laryngeal vestibule. Patient is at risk of aspiration regardless of consistency, however, even with chronic deficits he has been consuming PO diet for several months without reported pulmonary complications. Patient expresses desire to eat and drink by mouth vs tube. As pt  followed by palliative care, recommend goals of care discussion to aid determining pt's preferences and goals for nutrition/hydration. As thicker liquids did not improve pharyngeal clearance or prevent aspiration, if PO diet is chosen recommend regular liquids". PMH includes Ogilvie's syndrome, colon cancer s/p transverse colostomy, recurrent SBO, COPD, CAD, HTN, prior CVA with residual mild L sided weakness, seizure disorder, CKD IIIa, HFpEF      SLP Plan  Continue with current plan of care      Recommendations for follow up therapy are one component of a multi-disciplinary discharge planning process, led by the attending physician.  Recommendations may be updated based on patient status, additional functional criteria and insurance authorization.    Recommendations  Diet recommendations: Dysphagia 1 (puree);Thin liquid Liquids provided via: Cup;Straw Medication Administration: Crushed with puree Supervision: Staff to assist with self feeding;Full supervision/cueing for compensatory strategies Compensations: Slow rate;Small sips/bites;Clear throat intermittently;Hard cough after swallow Postural Changes and/or Swallow Maneuvers: Seated upright 90 degrees                  Oral care QID;Staff/trained caregiver to provide oral care   Frequent or constant Supervision/Assistance Dysphagia, oropharyngeal phase (R13.12)     Continue with current plan of care     Gwynneth Aliment, M.A., CF-SLP Speech Language Pathology, Acute Rehabilitation Services  Secure Chat preferred [phone]   07/23/2023, 1:39 PM

## 2023-07-24 DIAGNOSIS — R569 Unspecified convulsions: Secondary | ICD-10-CM | POA: Diagnosis not present

## 2023-07-24 LAB — GLUCOSE, CAPILLARY
Glucose-Capillary: 100 mg/dL — ABNORMAL HIGH (ref 70–99)
Glucose-Capillary: 96 mg/dL (ref 70–99)
Glucose-Capillary: 92 mg/dL (ref 70–99)

## 2023-07-24 LAB — BASIC METABOLIC PANEL
Anion gap: 8 (ref 5–15)
BUN: 8 mg/dL (ref 8–23)
CO2: 25 mmol/L (ref 22–32)
Calcium: 8.5 mg/dL — ABNORMAL LOW (ref 8.9–10.3)
Chloride: 105 mmol/L (ref 98–111)
Creatinine, Ser: 1.28 mg/dL — ABNORMAL HIGH (ref 0.61–1.24)
GFR, Estimated: 59 mL/min — ABNORMAL LOW (ref >60)
Glucose, Bld: 90 mg/dL (ref 70–99)
Potassium: 3.9 mmol/L (ref 3.5–5.1)
Sodium: 138 mmol/L (ref 135–145)

## 2023-07-24 MED ORDER — LEVETIRACETAM 1000 MG PO TABS: 1000.0000 mg | ORAL_TABLET | Freq: Two times a day (BID) | ORAL | Status: DC

## 2023-07-24 MED ORDER — DIVALPROEX SODIUM 500 MG PO DR TAB: 1000.0000 mg | DELAYED_RELEASE_TABLET | Freq: Two times a day (BID) | ORAL | Status: DC

## 2023-07-24 NOTE — Plan of Care (Signed)
  Problem: Clinical Measurements: Goal: Will remain free from infection Outcome: Progressing Goal: Diagnostic test results will improve Outcome: Progressing Goal: Respiratory complications will improve Outcome: Progressing   Problem: Activity: Goal: Risk for activity intolerance will decrease Outcome: Progressing   Problem: Nutrition: Goal: Adequate nutrition will be maintained Outcome: Progressing   Problem: Elimination: Goal: Will not experience complications related to bowel motility Outcome: Progressing Goal: Will not experience complications related to urinary retention Outcome: Progressing   Problem: Skin Integrity: Goal: Risk for impaired skin integrity will decrease Outcome: Progressing

## 2023-07-24 NOTE — Discharge Summary (Addendum)
Physician Discharge Summary  Calvin [name] WGN:[id number] DOB: 06-04-1951 DOA: 07/18/2023  PCP: Lucita Ferrara, MD  Admit date: 07/18/2023 Discharge date: 07/24/2023  Admitted From: SNF( Disposition:  SNF  Recommendations for Outpatient Follow-up:  Follow up with PCP in 1-2 weeks Please obtain BMP/CBC in one week Will need to follow-up with [facility] neurology in 1 month.   Home Health:No Equipment/Devices:None  Discharge Condition:Stable CODE STATUS:Full Diet recommendation: Dysphagia 1 diet  Brief/Interim Summary:  72 y.o. male past medical history of hypertension chronic diastolic heart failure, CKD, CVA with left hemiparesis, seizure disorder history of acutely syndrome colon cancer status post transverse colectomy recurrent SBO presents on 07/15/2023 for abdominal pain CT scan abdomen and pelvis showed diffusely small bowel obstruction.  General surgery was consulted placed NG tube to intermittent suction and treated conservatively.  On the morning of 07/17/2023 had seizure-like activity lasted about 30 seconds, CT of the head showed no acute findings EEG showed focal seizures in the left parietal region MRI showed restricted diffusion posteriorly in the right cerebellar hemisphere.  On 07/18/2023 patient had a long-term EEG   Discharge Diagnoses:  Principal Problem:   Seizure (HCC) Active Problems:   SBO, recurrent (small bowel obstruction) (HCC)   Ogilvie syndrome   Paroxysmal atrial flutter (HCC)   HTN (hypertension)   CAD (coronary artery disease)   COPD (chronic obstructive pulmonary disease) (HCC)   Ileus (HCC)   Hemiplegia and 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 side (HCC)  Complex partial seizures/history of seizures: On 07/17/2023 he had a partial seizure while at [facility] EEG showed focal seizure. Imaging CT and MRI of the brain showed no acute findings transferred to [facility] for long-term EEG neurology was consulted recommended to  continue Keppra Depakote Lyrica and Lamictal and he remained seizure-free. Neurology also recommended that we can try to wean off as an outpatient Depakote and increase lamotrigine to avoid polypharmacy. This could be done as an outpatient.  Small bowel obstruction/history of Ogilvie syndrome/recurrent SBO: Treated conservatively with bowel regimen has been having regular bowel movements without any difficulties. Avoid narcotics if possible.  Dysphagia: SLP was evaluated now he is in a dysphagia 1 diet tolerating.  Acute kidney injury: Likely.  No azotemia resolved with IV fluid hydration.  Left temporal pain: ESR and CRP were unremarkable. Pain was improved with Tylenol.  Chronic diastolic heart failure/essential hypertension: Continue metoprolol and lisinopril blood pressure is well-controlled.  Hypokalemia: Repleted now resolved.  Hyperlipidemia: Continue statin.  History of CVA: Continue aspirin and statin.  COPD: Continue inhalers, no changes made.   Discharge Instructions  Discharge Instructions     Diet - low sodium heart healthy   Complete by: As directed    Increase activity slowly   Complete by: As directed       Allergies as of 07/24/2023   No Known Allergies      Medication List     STOP taking these medications    Chlorhexidine Gluconate Cloth 2 % Pads   levETIRAcetam 1000 MG/100ML Soln Commonly known as: KEPPRA   loratadine 10 MG tablet Commonly known as: CLARITIN   morphine (PF) 2 MG/ML injection   naloxone 4 MG/0.1ML Liqd nasal spray kit Commonly known as: NARCAN   valproate 450 mg in dextrose 5 % 50 mL       TAKE these medications    acetaminophen 325 MG tablet Commonly known as: TYLENOL Take 650 mg by mouth every 6 (six) hours.   alum & mag hydroxide-simeth  200-200-20 MG/5ML suspension Commonly known as: MAALOX/MYLANTA Take 30 mLs by mouth every 12 (twelve) hours as needed for indigestion or heartburn.   aspirin 81  MG chewable tablet Take 81 mg by mouth daily.   baclofen 5 mg Tabs tablet Commonly known as: LIORESAL Take 5 mg by mouth 2 (two) times daily.   BENEFIBER DRINK MIX PO Take 5 g by mouth daily.   bisacodyl 5 MG EC tablet Commonly known as: DULCOLAX Take 5 mg by mouth daily.   bisacodyl 10 MG suppository Commonly known as: DULCOLAX Place 1 suppository (10 mg total) rectally daily as needed for moderate constipation or mild constipation.   citalopram 10 MG tablet Commonly known as: CELEXA Take 10 mg by mouth daily.   divalproex 500 MG DR tablet Commonly known as: DEPAKOTE Take 2 tablets (1,000 mg total) by mouth 2 (two) times daily.   enoxaparin 60 MG/0.6ML injection Commonly known as: LOVENOX Inject 0.45 mLs (45 mg total) into the skin daily.   fluticasone 50 MCG/ACT nasal spray Commonly known as: FLONASE Place 2 sprays into both nostrils daily.   lamoTRIgine 25 MG tablet Commonly known as: LAMICTAL Take 50 mg by mouth 2 (two) times daily.   levETIRAcetam 1000 MG tablet Commonly known as: KEPPRA Take 1 tablet (1,000 mg total) by mouth 2 (two) times daily.   lisinopril 5 MG tablet Commonly known as: ZESTRIL Take 1 tablet (5 mg total) by mouth daily.   Melatonin 10 MG Tabs Take 10 mg by mouth at bedtime.   metoprolol succinate 25 MG 24 hr tablet Commonly known as: TOPROL-XL Take 25 mg by mouth daily.   nitroGLYCERIN 0.4 MG SL tablet Commonly known as: NITROSTAT Place 0.4 mg under the tongue every 5 (five) minutes as needed for chest pain.   ondansetron 4 MG disintegrating tablet Commonly known as: ZOFRAN-ODT Take 4 mg by mouth every 8 (eight) hours as needed for nausea or vomiting.   pantoprazole 40 MG tablet Commonly known as: PROTONIX Take 40 mg by mouth in the morning.   polyethylene glycol 17 g packet Commonly known as: MIRALAX / GLYCOLAX Take 34 g by mouth daily.   pregabalin 50 MG capsule Commonly known as: LYRICA Take 50 mg capsule twice daily  and 100 mg (two capsules) at bedtime   prochlorperazine 10 MG/2ML injection Commonly known as: COMPAZINE Inject 2 mLs (10 mg total) into the vein every 6 (six) hours as needed.   senna 8.6 MG Tabs tablet Commonly known as: SENOKOT Take 2 tablets (17.2 mg total) by mouth at bedtime as needed for mild constipation or moderate constipation.   simvastatin 20 MG tablet Commonly known as: ZOCOR Take 20 mg by mouth at bedtime.   tamsulosin 0.4 MG Caps capsule Commonly known as: FLOMAX Take 0.4 mg by mouth every evening.   tiotropium 18 MCG inhalation capsule Commonly known as: SPIRIVA Place 18 mcg into inhaler and inhale daily.   trolamine salicylate 10 % cream Commonly known as: ASPERCREME Apply topically 2 (two) times daily as needed for muscle pain.        Contact information for after-discharge care     Destination     [facility] .   Service: Skilled Nursing Contact information: [address] [place] [address] [phone]                    No Known Allergies  Consultations: Neurology   Procedures/Studies: DG Abd Portable 1V  Result  Date: 07/19/2023 CLINICAL DATA:  161096 SBO (small bowel obstruction) (HCC) [id number] EXAM: PORTABLE ABDOMEN - 1 VIEW COMPARISON:  07/17/2023 FINDINGS: The bowel gas pattern is nonobstructive. Small amount of enteric contrast is present within the left colon. High attenuation structure is again seen within the central pelvis. No radio-opaque calculi or other significant radiographic abnormality are seen. IMPRESSION: Nonobstructive bowel gas pattern. Electronically Signed   By: Duanne Guess D.O.   On: 07/19/2023 15:24   Overnight EEG with video  Result Date: 07/19/2023 Charlsie Quest, MD     07/20/2023 10:05 AM [name] MRN: [id number] Epilepsy Attending: Charlsie Quest Referring Physician/Provider: Caryl Pina, MD Duration: 07/18/2023 1739 to 07/19/2023  1739  Patient history: 72yo M with seizure like activity getting eeg to evaluate for seizure  Level of alertness: Awake, asleep  AEDs during EEG study: LEV, VPA, PGB  Technical aspects: This EEG study was done with scalp electrodes positioned according to the 10-20 International system of electrode placement. Electrical activity was reviewed with band pass filter of 1-70Hz , sensitivity of 7 uV/mm, display speed of 49mm/sec with a 60Hz  notched filter applied as appropriate. EEG data were recorded continuously and digitally stored.  Video monitoring was available and reviewed as appropriate.  Description: The posterior dominant rhythm consists of 6-7 Hz activity of moderate voltage (25-35 uV) seen predominantly in posterior head regions, symmetric and reactive to eye opening and eye closing. Sleep was characterized by vertex waves, sleep spindles (12 to 14 Hz), maximal frontocentral region.  EEG showed continuous generalized predominantly 5 to 7 Hz theta slowing admixed with intermittent generalized 2 to 3 Hz delta slowing. Hyperventilation and photic stimulation were not performed. On 07/19/2023 at 0637, 0639, 0641 and 0658, EEG showed spike and wave activity in left more than right occipital region which lasted for about 20 seconds each time without definite evolution.  Clinically, patient was noted to be laying in bed with eyes closed except at 0658 when he appeared to be mumbling something.  Patient does have history of seizures arising from this location but without clinical signs and definite evolution, these events are most likely interictal in nature.  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  On 07/19/2023 after 0637, spikes were noted more frequently without definite evolution.  This EEG pattern was on the ictal-interictal continuum with increased  risk of seizures.  Additionally EEG suggestive of moderate diffuse encephalopathy.  Charlsie Quest   MR BRAIN WO CONTRAST  Result Date: 07/18/2023 CLINICAL DATA:  Seizure. EXAM: MRI HEAD WITHOUT CONTRAST TECHNIQUE: Multiplanar, multiecho pulse sequences of the brain and surrounding structures were obtained without intravenous contrast. COMPARISON:  Head CT 07/17/2023 and MRI 04/24/2023 FINDINGS: The study is motion degraded, including severe motion on the thin-section coronal T2 sequence through the temporal lobes. Brain: There are new confluent regions of mildly restricted diffusion and T2 hyperintensity involving portions of the right parietal, temporal, and occipital lobes as well as right cingulate gyrus. Chronic cortical infarcts are again noted in the right frontal and right occipital lobes, and there is also unchanged encephalomalacia in the anteroinferior right greater than left frontal lobes and anterior right temporal lobe typically seen with remote trauma. Asymmetric, diffuse right cerebral hemispheric atrophy may be related to the patient's chronic seizure disorder. Chronic there is a background of mild chronic small vessel ischemia in the  cerebral white matter bilaterally. Small chronic left cerebellar infarcts are unchanged. Vascular: Chronically occluded distal left vertebral artery. Skull and upper cervical spine: No suspicious marrow lesion. Sinuses/Orbits: Remote right orbital fracture. Polyp or mucous retention cyst in the right sphenoid sinus. Clear mastoid air cells. Other: None. IMPRESSION: 1. Mildly restricted diffusion posteriorly in the right cerebral hemisphere which spans vascular territories and is favored to reflect recent seizure activity. 2. Multifocal encephalomalacia as described above. Electronically Signed   By: Sebastian Ache M.D.   On: 07/18/2023 14:23   DG Pelvis 1-2 Views  Result Date: 07/18/2023 CLINICAL DATA:  [id number] MRI contraindicated due to metal implant [id number]  EXAM: PELVIS - 1-2 VIEW COMPARISON:  07/17/2023, 07/15/2023, 06/22/2023 FINDINGS: There is no evidence of pelvic fracture or diastasis. 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 1.2 cm high attenuation structure within the mid pelvis which was noted to be within the sigmoid colon on previous CT. This structure is denser than enteric contrast and could represent an intraluminal metallic foreign body. Atherosclerotic vascular calcifications are present. IMPRESSION: 1. There is a 1.2 cm high attenuation structure within the mid pelvis which was noted to be within the sigmoid colon on previous CT. This structure is denser than enteric contrast and could represent an intraluminal metallic foreign body. 2. 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 Electronically Signed   By: Duanne Guess D.O.   On: 07/18/2023 13:10   EEG adult  Result Date: 07/18/2023 Charlsie Quest, MD     07/18/2023  1:00 PM atient Name: MYLIN STOLAR MRN: [id number] Epilepsy Attending: Charlsie Quest Referring Physician/Provider: Gordy Councilman, MD Date: 07/18/2023 Duration: 1 hour 4 mins  Patient history: 72yo M with seizure like activity getting eeg to evaluate for seizure  Level of alertness: Awake  AEDs during EEG study: LEV, VPA, PGB  Technical aspects: This EEG study was done with scalp electrodes positioned according to the 10-20 International system of electrode placement. Electrical activity was reviewed with band pass filter of 1-70Hz , sensitivity of 7 uV/mm, display speed of 69mm/sec with a 60Hz  notched filter applied as appropriate. EEG data were recorded continuously and digitally stored.  Video monitoring was available and reviewed as appropriate.  Description: EEG showed continuous generalized predominantly 5 to 7 Hz theta slowing admixed with intermittent generalized 2 to 3 Hz delta slowing.  Hyperventilation and photic stimulation were not performed.    ABNORMALITY - Continuous slow, generalized  IMPRESSION: This study is suggestive of moderate diffuse encephalopathy.  No further seizures or epileptiform discharges were noted.  Dr. Iver Nestle was notified.  DG Abd 1 View  Result Date: 07/17/2023 CLINICAL DATA:  Abdominal pain. EXAM: ABDOMEN - 1 VIEW COMPARISON:  Abdominal x-ray from yesterday. FINDINGS: Interval removal of the enteric tube. Small bowel dilatation appears resolved. Oral contrast in the left colon. No acute osseous abnormality. IMPRESSION: 1. Interval resolution of small bowel obstruction. Electronically Signed   By: Obie Dredge M.D.   On: 07/17/2023 14:50   EEG adult  Result Date: 07/17/2023 Charlsie Quest, MD     07/18/2023 12:58 PM [name] MRN: [id number] Epilepsy Attending: Charlsie Quest Referring Physician/Provider: Jonah Blue, MD Date: 07/17/2023 Duration: 23.58 mins Patient history: 73yo M with seizure like activity getting eeg to evaluate for seizure Level of alertness: Awake AEDs during EEG study: LEV, PGB, Ativan Technical aspects: This EEG study was done  with scalp electrodes positioned according to the 10-20 International system of electrode placement. Electrical activity was reviewed with band pass filter of 1-70Hz , sensitivity of 7 uV/mm, display speed of 1mm/sec with a 60Hz  notched filter applied as appropriate. EEG data were recorded continuously and digitally stored.  Video monitoring was available and reviewed as appropriate. Description: At the beginning of the study, EEG showed continuous generalized 3-5hz  theta- delta slowing.  At 1150, patient was noted to have left side face and head jerking. Concomitant EEG showed sharp waves in left parieto-occipital region admixed with 2 to 3 Hz delta slowing which quickly involved right parieto-occipital region.  Subsequently there was significant myogenic artifact making the study  difficult to interpret.  Seizure ended at 1152. Second seizure was noted at 1155 arising from left right occipital region.  No clinical signs were noted during the seizure.  Duration of seizure was about 30 seconds. Third seizure was noted at 1202 arising from left parieto-occipital region during which she was noted to have left-sided twitching.  Duration of seizure was about 45 seconds. Fourth seizure was noted at 1206 as above left right occipital region without any clinical signs.  Duration of seizure was about 30 seconds. IV Ativan was administered at around 1209.  Subsequently EEG showed continuous generalized low amplitude 5 to 8 Hz theta- alpha activity admixed with intermittent generalized 2 to 3 Hz delta slowing. Hyperventilation and photic stimulation were not performed.   ABNORMALITY - Focal seizure, left parieto-occipital region - Continuous slow, generalized IMPRESSION: This study showed 4 focal seizures arising from left parieto-occipital region as described above lasting 30 seconds to 2 minutes each.  Additionally there is moderate diffuse encephalopathy.  Dr. Iver Nestle was notified. Priyanka Annabelle Harman   CT HEAD WO CONTRAST ( )  Result Date: 07/17/2023 CLINICAL DATA:  Mental status change with unknown cause EXAM: CT HEAD WITHOUT CONTRAST TECHNIQUE: Contiguous axial images were obtained from the base of the skull through the vertex without intravenous contrast. RADIATION DOSE REDUCTION: This exam was performed according to the departmental dose-optimization program which includes automated exposure control, adjustment of the mA and/or kV according to patient size and/or use of iterative reconstruction technique. COMPARISON:  06/24/2023 FINDINGS: Brain: No evidence of acute infarction, hemorrhage, hydrocephalus, extra-axial collection or mass lesion/mass effect. Chronic cortically based infarcts scattered along the right cerebral convexity including at the lateral frontal and occipital parietal  cortex. The right cerebral hemisphere is more atrophic than the left, possibly post ischemic or from the patient's history of seizure disorder. Small chronic infarcts in the left cerebellum. Bilateral inferior frontal and right anterior temporal encephalomalacia, usually posttraumatic. Vascular: No hyperdense vessel or unexpected calcification. Skull: Normal. Negative for fracture or focal lesion. Sinuses/Orbits: No acute finding. IMPRESSION: No acute finding. Atrophy and multifocal encephalomalacia as described. Asymmetric right hemispheric atrophy. Electronically Signed   By: Tiburcio Pea M.D.   On: 07/17/2023 11:25   DG ABD ACUTE 2+V W 1V CHEST  Result Date: 07/16/2023 CLINICAL DATA:  Abdomen pain history of colostomy EXAM: DG ABDOMEN ACUTE WITH 1 VIEW CHEST COMPARISON:  CT 07/15/2023, radiograph 06/22/2023, chest x-ray 06/22/2023 FINDINGS: Single view chest demonstrates elevation of the right diaphragm. No acute airspace disease or effusion. Stable cardiomediastinal silhouette. No pneumothorax. Possible chronic AVN type changes involving the left humeral head. Supine and upright views of the abdomen demonstrate no convincing free air beneath the diaphragm. Nonobstructed bowel-gas pattern with some radiopaque material in left colon. Left hip replacement with prominent heterotopic ossification. IMPRESSION: 1.  No acute cardiopulmonary disease. 2. Nonobstructed bowel-gas pattern. Electronically Signed   By: Jasmine Pang M.D.   On: 07/16/2023 15:08   CT ABDOMEN PELVIS W CONTRAST  Result Date: 07/15/2023 CLINICAL DATA:  Abdominal pain with nausea and vomiting. History of left lower quadrant stoma. EXAM: CT ABDOMEN AND PELVIS WITH CONTRAST TECHNIQUE: Multidetector CT imaging of the abdomen and pelvis was performed using the standard protocol following bolus administration of intravenous contrast. RADIATION DOSE REDUCTION: This exam was performed according to the departmental dose-optimization program  which includes automated exposure control, adjustment of the mA and/or kV according to patient size and/or use of iterative reconstruction technique. CONTRAST:  80mL OMNIPAQUE IOHEXOL 350 MG/ML SOLN COMPARISON:  06/22/2023 FINDINGS: Lower chest: Dependent atelectasis noted in the lung bases, right greater than left and similar to prior. Hepatobiliary: No suspicious focal abnormality within the liver parenchyma. Layering tiny gallstones evident. No intrahepatic or extrahepatic biliary dilation. Pancreas: No focal mass lesion. No dilatation of the main duct. No intraparenchymal cyst. No peripancreatic edema. Spleen: No splenomegaly. No suspicious focal mass lesion. Adrenals/Urinary Tract: Stable small bilateral adrenal nodules. These are stable since noncontrast CT stone study 09/30/2017 and demonstrated low attenuation on that study consistent with benign adenomas. No followup imaging is recommended. Cortical scarring noted in both kidneys without hydronephrosis. No evidence for hydroureter. The urinary bladder appears normal for the degree of distention. Stomach/Bowel: Stomach is moderately distended, filled with fluid and gas. Duodenum is normally positioned as is the ligament of Treitz. Small bowel loops are diffusely fluid-filled and distended up to about 3.7 cm diameter. Fluid-filled distension extends distally to the level of the terminal ileum. The appendix is normal. Right colon is fluid-filled but un distended in transverse colon is nondistended. Left abdominal transverse loop colostomy evident with parastomal fat herniation similar to prior. Minimal diverticular disease is seen in the descending colon without diverticulitis. Vascular/Lymphatic: There is mild atherosclerotic calcification of the abdominal aorta without aneurysm. There is no gastrohepatic or hepatoduodenal ligament lymphadenopathy. No retroperitoneal or mesenteric lymphadenopathy. No pelvic sidewall lymphadenopathy. Reproductive: The prostate  gland and seminal vesicles are unremarkable. Other: There is new small volume free fluid in the abdomen (see lateral left abdomen images 49-67 of series 2 and right abdomen on 43/2. Musculoskeletal: Status post left hip replacement. No worrisome lytic or sclerotic osseous abnormality. IMPRESSION: 1. Diffusely fluid-filled and distended small bowel loops up to about 3.7 cm diameter in the jejunal region. There is no discrete transition zone and the distal and terminal ileum nondistended but fluid-filled. Given the presence of new small volume free fluid in the abdomen, underlying component of small bowel obstruction suspected. No small bowel wall thickening or pneumatosis/portal venous gas. Right and transverse colon proximal to the loop colostomy is nondilated. 2. Left abdominal transverse loop colostomy with parastomal fat herniation similar to prior. 3. Cholelithiasis. 4.  Aortic Atherosclerosis (ICD10-I70.0). Electronically Signed   By: Kennith Center M.D.   On: 07/15/2023 05:32   CT HEAD WO CONTRAST ( )  Result Date: 06/24/2023 CLINICAL DATA:  Neuro deficit, acute, stroke suspected. EXAM: CT HEAD WITHOUT CONTRAST TECHNIQUE: Contiguous axial images were obtained from the base of the skull through the vertex without intravenous contrast. RADIATION DOSE REDUCTION: This exam was performed according to the departmental dose-optimization program which includes automated exposure control, adjustment of the mA and/or kV according to patient size and/or use of iterative reconstruction technique. COMPARISON:  CT head without contrast 06/03/2023 FINDINGS: Brain: A remote anterior right frontal lobe infarct is  stable. Chronic atrophy and white matter disease is unchanged. Remote infarct of the right occipital lobe is stable. Ex vacuo dilation of the right lateral ventricle is noted both anteriorly and posteriorly. No acute infarct, hemorrhage, or mass lesion is present. Cyst Remote left posterior cerebellar infarcts  are present. The brainstem and cerebellum are otherwise normal. Midline structures are within normal limits. Vascular: Atherosclerotic calcifications are again noted within the cavernous internal carotid arteries bilaterally. No hyperdense vessel is present. Skull: Calvarium is intact. Calvarium is intact. No focal lytic or blastic lesions are present. No significant extracranial soft tissue lesion is present. Sinuses/Orbits: A polyp or mucous retention cyst is present in the right sphenoid sinus. A remote right orbital blowout fracture is again noted. The paranasal sinuses and mastoid air cells are otherwise clear. Globes and orbits are otherwise within normal limits bilaterally. IMPRESSION: 1. No acute intracranial abnormality or significant interval change. 2. Stable remote anterior right frontal lobe infarct. 3. Stable remote right occipital lobe infarct. 4. Remote left posterior cerebellar infarcts. Electronically Signed   By: Marin Roberts M.D.   On: 06/24/2023 15:12     Subjective: No complaints  Discharge Exam: Vitals:   07/24/23 0732 07/24/23 0843  BP: (!) 107/59   Pulse: 69   Resp: 18   Temp: 98.2 F (36.8 C)   SpO2: 97% 98%   Vitals:   07/23/23 2349 07/24/23 0353 07/24/23 0732 07/24/23 0843  BP: 115/63 (!) 104/55 (!) 107/59   Pulse: 78 69 69   Resp: 18 18 18    Temp: 98.7 F (37.1 C) 98.6 F (37 C) 98.2 F (36.8 C)   TempSrc: Oral Oral Oral   SpO2: 95% 92% 97% 98%  Weight:      Height:        General: Pt is alert, awake, not in acute distress Cardiovascular: RRR, S1/S2 +, no rubs, no gallops Respiratory: CTA bilaterally, no wheezing, no rhonchi Abdominal: Soft, NT, ND, bowel sounds + Extremities: no edema, no cyanosis    The results of significant diagnostics from this hospitalization (including imaging, microbiology, ancillary and laboratory) are listed below for reference.     Microbiology: Recent Results (from the past 240 hour(s))  MRSA Next Gen by  PCR, Nasal     Status: Abnormal   Collection Time: 07/17/23 12:48 PM   Specimen: Nasal Mucosa; Nasal Swab  Result Value Ref Range Status   MRSA by PCR Next Gen DETECTED (A) NOT DETECTED Final    Comment: RESULT CALLED TO, READ BACK BY AND VERIFIED WITH: Claudette Stapler 07/17/23 1403 MW (NOTE) The GeneXpert MRSA Assay (FDA approved for NASAL specimens only), is one component of a comprehensive MRSA colonization surveillance program. It is not intended to diagnose MRSA infection nor to guide or monitor treatment for MRSA infections. Test performance is not FDA approved in patients less than 41 years old. Performed at [facility], [address] [place], [place] [address]      Labs: BNP (last 3 results) No results for input(s): "BNP" in the last 8760 hours. Basic Metabolic Panel: Recent Labs  Lab 07/18/23 0328 07/19/23 0808 07/20/23 0508 07/21/23 0448 07/22/23 0322 07/23/23 0522 07/24/23 0557  NA 137   < > 138 139 140 140 138  K 3.9   < > 3.4* 3.7 3.4* 3.3* 3.9  CL 98   < > 104 102 105 107 105  CO2 28   < > 23 25 27 25 25   GLUCOSE 74   < >  90 74 87 102* 90  BUN 14   < > 5* 7* 11 10 8   CREATININE 1.23   < > 1.22 1.21 1.50* 1.27* 1.28*  CALCIUM 8.9   < > 8.4* 8.6* 8.5* 8.2* 8.5*  MG 1.8  --   --   --   --   --   --    < > = values in this interval not displayed.   Liver Function Tests: No results for input(s): "AST", "ALT", "ALKPHOS", "BILITOT", "PROT", "ALBUMIN" in the last 168 hours. No results for input(s): "LIPASE", "AMYLASE" in the last 168 hours. Recent Labs  Lab 07/18/23 0328  AMMONIA <10   CBC: Recent Labs  Lab 07/19/23 0808 07/20/23 1147 07/21/23 0448 07/22/23 0322 07/23/23 0522  WBC 4.3 4.1 3.8* 4.5 4.4  NEUTROABS  --  2.6 2.4 2.6 3.1  HGB 12.1* 13.6 11.7* 10.9* 11.7*  HCT 37.2* 41.9 37.6* 35.1* 37.6*  MCV 88.2 89.7 91.5 92.4 94.0  PLT 148* 142* 132* 136* 138*   Cardiac Enzymes: No results for input(s): "CKTOTAL", "CKMB", "CKMBINDEX",  "TROPONINI" in the last 168 hours. BNP: Invalid input(s): "POCBNP" CBG: Recent Labs  Lab 07/23/23 1221 07/23/23 1607 07/23/23 2113 07/24/23 0623 07/24/23 0735  GLUCAP 129* 127* 104* 100* 96   D-Dimer No results for input(s): "DDIMER" in the last 72 hours. Hgb A1c No results for input(s): "HGBA1C" in the last 72 hours. Lipid Profile No results for input(s): "CHOL", "HDL", "LDLCALC", "TRIG", "CHOLHDL", "LDLDIRECT" in the last 72 hours. Thyroid function studies No results for input(s): "TSH", "T4TOTAL", "T3FREE", "THYROIDAB" in the last 72 hours.  Invalid input(s): "FREET3" Anemia work up No results for input(s): "VITAMINB12", "FOLATE", "FERRITIN", "TIBC", "IRON", "RETICCTPCT" in the last 72 hours. Urinalysis    Component Value Date/Time   COLORURINE STRAW (A) 07/16/2023 0247   APPEARANCEUR CLEAR (A) 07/16/2023 0247   APPEARANCEUR Clear 08/14/2014 0933   LABSPEC 1.019 07/16/2023 0247   LABSPEC 1.015 08/14/2014 0933   PHURINE 5.0 07/16/2023 0247   GLUCOSEU NEGATIVE 07/16/2023 0247   GLUCOSEU Negative 08/14/2014 0933   HGBUR NEGATIVE 07/16/2023 0247   BILIRUBINUR NEGATIVE 07/16/2023 0247   BILIRUBINUR Negative 08/14/2014 0933   KETONESUR NEGATIVE 07/16/2023 0247   PROTEINUR NEGATIVE 07/16/2023 0247   UROBILINOGEN 0.2 06/12/2010 0103   NITRITE NEGATIVE 07/16/2023 0247   LEUKOCYTESUR TRACE (A) 07/16/2023 0247   LEUKOCYTESUR Negative 08/14/2014 0933   Sepsis Labs Recent Labs  Lab 07/20/23 1147 07/21/23 0448 07/22/23 0322 07/23/23 0522  WBC 4.1 3.8* 4.5 4.4   Microbiology Recent Results (from the past 240 hour(s))  MRSA Next Gen by PCR, Nasal     Status: Abnormal   Collection Time: 07/17/23 12:48 PM   Specimen: Nasal Mucosa; Nasal Swab  Result Value Ref Range Status   MRSA by PCR Next Gen DETECTED (A) NOT DETECTED Final    Comment: RESULT CALLED TO, READ BACK BY AND VERIFIED WITH: Claudette Stapler 07/17/23 1403 MW (NOTE) The GeneXpert MRSA Assay (FDA approved for  NASAL specimens only), is one component of a comprehensive MRSA colonization surveillance program. It is not intended to diagnose MRSA infection nor to guide or monitor treatment for MRSA infections. Test performance is not FDA approved in patients less than 81 years old. Performed at [facility], [address], [place], [place] [address]      Time coordinating discharge: Over 35 minutes  SIGNED:   Marinda Elk, MD  [facility] 07/24/2023, 9:30 AM Pager   If 7PM-7AM, please  contact night-coverage [email or web address] Password [email or web address]

## 2023-07-24 NOTE — Plan of Care (Signed)

## 2023-07-24 NOTE — Progress Notes (Signed)
Attempted to call report to [facility] x3 without success.

## 2023-07-24 NOTE — TOC Transition Note (Signed)
Transition of Care [facility]) - CM/SW Discharge Note   Patient Details  Name: Calvin [name] MRN: [id number] Date of Birth: 12-21-1950  Transition of Care [facility]) CM/SW Contact:  Baldemar Lenis, LCSW Phone Number: 07/24/2023, 11:02 AM   Clinical Narrative:  Patient stable to return to [facility] today. CSW sent discharge summary, spoke with [facility] and confirmed receipt. CSW updated daughter, [name], she is in agreement. Transport arranged with [facility] for next available.  Nurse to call report to [phone], Room B5.    Final next level of care: Skilled Nursing Facility Barriers to Discharge: Barriers Resolved   Patient Goals and CMS Choice      Discharge Placement                Patient chooses bed at:  [facility]) Patient to be transferred to facility by: [facility] Name of family member notified: [name] Patient and family notified of of transfer: 07/24/23  Discharge Plan and Services Additional resources added to the After Visit Summary for                                       Social Determinants of Health (SDOH) Interventions SDOH Screenings   Food Insecurity: No Food Insecurity (07/19/2023)  Housing: Low Risk  (07/19/2023)  Transportation Needs: No Transportation Needs (07/19/2023)  Utilities: Not At Risk (07/19/2023)  Tobacco Use: Medium Risk (07/19/2023)     Readmission Risk Interventions    07/17/2023   12:24 PM 06/30/2022    2:25 PM 06/02/2022   12:02 PM  Readmission Risk Prevention Plan  Transportation Screening Complete Complete Complete  PCP or Specialist Appt within 3-5 Days Complete    Social Work Consult for Recovery Care Planning/Counseling Complete    Palliative Care Screening Not Applicable    Medication Review [facility]) Complete  Complete  PCP or Specialist appointment within 3-5 days of discharge   Complete  HRI or Home Care Consult  Complete Complete  SW Recovery Care/Counseling Consult  Complete  Complete  Palliative Care Screening  Complete Complete  Skilled Nursing Facility  Complete Complete

## 2023-07-24 NOTE — Progress Notes (Addendum)
D/C report being called by Pearson Forster, to [place] facility.

## 2023-07-28 ENCOUNTER — Emergency Department: Payer: Medicare Other

## 2023-07-28 ENCOUNTER — Emergency Department
Admission: EM | Admit: 2023-07-28 | Discharge: 2023-07-28 | Disposition: A | Discharge status: Home / Self Care | Payer: Medicare Other | Attending: Emergency Medicine | Admitting: Emergency Medicine

## 2023-07-28 DIAGNOSIS — W44F3XA Food entering into or through a natural orifice, initial encounter: Secondary | ICD-10-CM | POA: Diagnosis not present

## 2023-07-28 DIAGNOSIS — T17308A Unspecified foreign body in larynx causing other injury, initial encounter: Secondary | ICD-10-CM | POA: Diagnosis not present

## 2023-07-28 DIAGNOSIS — R0989 Other specified symptoms and signs involving the circulatory and respiratory systems: Secondary | ICD-10-CM | POA: Diagnosis present

## 2023-07-28 MED ORDER — GLUCAGON HCL RDNA (DIAGNOSTIC) 1 MG IJ SOLR
1.0000 mg | Freq: Once | INTRAMUSCULAR | Status: AC
  Administered 2023-07-28: 1 mg via INTRAMUSCULAR
  Filled 2023-07-28: qty 1

## 2023-07-28 NOTE — ED Triage Notes (Signed)
Pt arrives via [facility] from [facility] for potential globus. While eating lunch staff had concern for pt choking and being unable to clear his food fully. NAD noted during triage.

## 2023-07-28 NOTE — Discharge Instructions (Signed)
Please seek medical attention for any high fevers, chest pain, shortness of breath, change in behavior, persistent vomiting, bloody stool or any other new or concerning symptoms.  

## 2023-07-28 NOTE — ED Provider Notes (Signed)
[facility] Provider Note    Event Date/Time   First MD Initiated Contact with Patient 07/28/23 1636     (approximate)   History   Globus   HPI  Calvin [name] is a 72 y.o. male who presents from living facility after apparent choking episode.  Apparently they also have concern he might have food stuck in his esophagus.  Patient is unable to give any significant history here.     Physical Exam   Triage Vital Signs: ED Triage Vitals [07/28/23 1605]  Encounter Vitals Group     BP 135/64     Systolic BP Percentile      Diastolic BP Percentile      Pulse Rate (!) 52     Resp 14     Temp 97.7 F (36.5 C)     Temp src      SpO2 99 %     Weight      Height      Head Circumference      Peak Flow      Pain Score 9     Pain Loc      Pain Education      Exclude from Growth Chart     Most recent vital signs: Vitals:   07/28/23 1605  BP: 135/64  Pulse: (!) 52  Resp: 14  Temp: 97.7 F (36.5 C)  SpO2: 99%   General: Somnolent, awakens to verbal stimuli. Not oriented. CV:  Good peripheral perfusion. Bradycardia. Resp:  Normal effort. Lungs clear. Abd:  No distention.    ED Results / Procedures / Treatments   Labs (all labs ordered are listed, but only abnormal results are displayed) Labs Reviewed - No data to display   EKG  None   RADIOLOGY I independently interpreted and visualized the CXR. My interpretation: No foreign body Radiology interpretation:  IMPRESSION:  1. Mild bilateral interstitial opacities may reflect pulmonary edema  or scarring.  2. Mild left basilar atelectasis/airspace disease.  3. Flattening of the left humeral head may reflect avascular    I independently interpreted and visualized the Abd x-ray. My interpretation: No dilated bowel Radiology interpretation:  IMPRESSION:  1. Nonobstructive bowel gas pattern.     PROCEDURES:  Critical Care performed: No   MEDICATIONS ORDERED IN ED: Medications   glucagon (human recombinant) (GLUCAGEN) injection 1 mg (has no administration in time range)     IMPRESSION / MDM / ASSESSMENT AND PLAN / ED COURSE  I reviewed the triage vital signs and the nursing notes.                              Differential diagnosis includes, but is not limited to, food impaction, SBO  Patient's presentation is most consistent with acute presentation with potential threat to life or bodily function.  Patient presented to the emergency department today because of concerns for choking episode and possible food impaction.  Per chart review the patient was recently in the hospital for SBO.  On exam patient without any breathing difficulty or vomiting.  I did try glucagon given the patient cannot give a good history.  He was subsequently able to tolerate water.  Chest x-ray and abdominal x-ray without concerning acute abnormalities.  At this time given that the patient is able to tolerate p.o. I think it is reasonable to discharge back to facility.      FINAL CLINICAL IMPRESSION(S) / ED  DIAGNOSES   Final diagnoses:  Choking, initial encounter     Note:  This document was prepared using Dragon voice recognition software and may include unintentional dictation errors.    Phineas Semen, MD 07/28/23 2115

## 2023-07-28 NOTE — ED Notes (Signed)
Pt resting in hall bed, asked pt if he felt better, pt did not remember that he was complaining of throat feeling full.

## 2023-07-28 NOTE — ED Notes (Signed)
Gave pt water to sip. Pt sipped most of water in cup then choked slightly at the end.

## 2023-07-28 NOTE — ED Notes (Signed)
EDP at bedside  

## 2023-07-30 ENCOUNTER — Emergency Department
Admission: EM | Admit: 2023-07-30 | Discharge: 2023-07-30 | Disposition: A | Discharge status: Home / Self Care | Payer: Medicare Other | Attending: Emergency Medicine | Admitting: Emergency Medicine

## 2023-07-30 ENCOUNTER — Other Ambulatory Visit: Payer: Self-pay

## 2023-07-30 DIAGNOSIS — N390 Urinary tract infection, site not specified: Secondary | ICD-10-CM | POA: Diagnosis not present

## 2023-07-30 DIAGNOSIS — G40909 Epilepsy, unspecified, not intractable, without status epilepticus: Secondary | ICD-10-CM | POA: Insufficient documentation

## 2023-07-30 DIAGNOSIS — J449 Chronic obstructive pulmonary disease, unspecified: Secondary | ICD-10-CM | POA: Insufficient documentation

## 2023-07-30 DIAGNOSIS — I2510 Atherosclerotic heart disease of native coronary artery without angina pectoris: Secondary | ICD-10-CM | POA: Diagnosis not present

## 2023-07-30 LAB — COMPREHENSIVE METABOLIC PANEL
ALT: 12 U/L (ref 0–44)
AST: 15 U/L (ref 15–41)
Albumin: 3.7 g/dL (ref 3.5–5.0)
Alkaline Phosphatase: 41 U/L (ref 38–126)
Anion gap: 7 (ref 5–15)
BUN: 28 mg/dL — ABNORMAL HIGH (ref 8–23)
CO2: 29 mmol/L (ref 22–32)
Calcium: 8.5 mg/dL — ABNORMAL LOW (ref 8.9–10.3)
Chloride: 107 mmol/L (ref 98–111)
Creatinine, Ser: 1.62 mg/dL — ABNORMAL HIGH (ref 0.61–1.24)
GFR, Estimated: 44 mL/min — ABNORMAL LOW (ref >60)
Glucose, Bld: 95 mg/dL (ref 70–99)
Potassium: 5.1 mmol/L (ref 3.5–5.1)
Sodium: 143 mmol/L (ref 135–145)
Total Bilirubin: 0.3 mg/dL (ref <1.2)
Total Protein: 7 g/dL (ref 6.5–8.1)

## 2023-07-30 LAB — CBC WITH DIFFERENTIAL/PLATELET
Abs Immature Granulocytes: 0.21 10*3/uL — ABNORMAL HIGH (ref 0.00–0.07)
Basophils Absolute: 0 10*3/uL (ref 0.0–0.1)
Basophils Relative: 1 %
Eosinophils Absolute: 0 10*3/uL (ref 0.0–0.5)
Eosinophils Relative: 0 %
HCT: 40.4 % (ref 39.0–52.0)
Hemoglobin: 12 g/dL — ABNORMAL LOW (ref 13.0–17.0)
Immature Granulocytes: 4 %
Lymphocytes Relative: 25 %
Lymphs Abs: 1.2 10*3/uL (ref 0.7–4.0)
MCH: 29.1 pg (ref 26.0–34.0)
MCHC: 29.7 g/dL — ABNORMAL LOW (ref 30.0–36.0)
MCV: 97.8 fL (ref 80.0–100.0)
Monocytes Absolute: 0.6 10*3/uL (ref 0.1–1.0)
Monocytes Relative: 12 %
Neutro Abs: 2.8 10*3/uL (ref 1.7–7.7)
Neutrophils Relative %: 58 %
Platelets: 187 10*3/uL (ref 150–400)
RBC: 4.13 MIL/uL — ABNORMAL LOW (ref 4.22–5.81)
RDW: 13.4 % (ref 11.5–15.5)
WBC: 4.9 10*3/uL (ref 4.0–10.5)
nRBC: 0 % (ref 0.0–0.2)

## 2023-07-30 LAB — URINALYSIS, W/ REFLEX TO CULTURE (INFECTION SUSPECTED)
Bilirubin Urine: NEGATIVE
Glucose, UA: NEGATIVE mg/dL
Hgb urine dipstick: NEGATIVE
Ketones, ur: NEGATIVE mg/dL
Nitrite: POSITIVE — AB
Protein, ur: NEGATIVE mg/dL
Specific Gravity, Urine: 1.016 (ref 1.005–1.030)
WBC, UA: >50 WBC/hpf (ref 0–5)
pH: 5 (ref 5.0–8.0)

## 2023-07-30 LAB — TROPONIN I (HIGH SENSITIVITY): Troponin I (High Sensitivity): 11 ng/L (ref <18)

## 2023-07-30 LAB — VALPROIC ACID LEVEL: Valproic Acid Lvl: 104 ug/mL — ABNORMAL HIGH (ref 50.0–100.0)

## 2023-07-30 MED ORDER — CEFDINIR 300 MG PO CAPS: 300.0000 mg | ORAL_CAPSULE | Freq: Two times a day (BID) | ORAL | 0 refills | Status: AC

## 2023-07-30 MED ORDER — SODIUM CHLORIDE 0.9 % IV SOLN
1.0000 g | Freq: Once | INTRAVENOUS | Status: AC
  Administered 2023-07-30: 1 g via INTRAVENOUS
  Filled 2023-07-30: qty 10

## 2023-07-30 MED ORDER — LEVETIRACETAM IN NACL 1000 MG/100ML IV SOLN
1000.0000 mg | Freq: Once | INTRAVENOUS | Status: AC
  Administered 2023-07-30: 1000 mg via INTRAVENOUS
  Filled 2023-07-30: qty 100

## 2023-07-30 NOTE — ED Notes (Signed)
Pt's NP from [facility] called to check on pt and give a report--per NP, the pt wanted to come to the hospital and per NP, and staff, the pt has been more lethargic than he normally is.  Pt was in/out and was clean when checked. Covered in warm blankets, NAD at this time

## 2023-07-30 NOTE — ED Provider Notes (Signed)
[facility] Provider Note   Event Date/Time   First MD Initiated Contact with Patient 07/30/23 2116     (approximate) History  Seizures  HPI Calvin [name] is a 72 y.o. male with a past medical history of epilepsy, CAD, COPD, alcohol abuse, anxiety/depression, gout, and hyperlipidemia who presents via EMS from [facility] stating that he had an aura that felt like he was going to have a seizure.  Patient did not have any seizure activity in the facility or with EMS en route.  Facility called patient's physician who reportedly requested patient be evaluated in the emergency department. ROS: Patient currently denies any vision changes, tinnitus, difficulty speaking, facial droop, sore throat, chest pain, shortness of breath, abdominal pain, nausea/vomiting/diarrhea, dysuria, or weakness/numbness/paresthesias in any extremity   Physical Exam  Triage Vital Signs: ED Triage Vitals  Encounter Vitals Group     BP      Systolic BP Percentile      Diastolic BP Percentile      Pulse      Resp      Temp      Temp src      SpO2      Weight      Height      Head Circumference      Peak Flow      Pain Score      Pain Loc      Pain Education      Exclude from Growth Chart    Most recent vital signs: Vitals:   07/30/23 2200 07/30/23 2230  BP: (!) 99/58 (!) 95/52  Pulse: 64 (!) 58  Resp: 11 13  Temp:    SpO2: 99% 100%   General: Awake, cooperative with CV:  Good peripheral perfusion.  Resp:  Normal effort.  Abd:  No distention.  Colostomy in place with light brown stool Other:  Elderly overweight African-American male resting comfortably in no acute distress ED Results / Procedures / Treatments  Labs (all labs ordered are listed, but only abnormal results are displayed) Labs Reviewed  COMPREHENSIVE METABOLIC PANEL - Abnormal; Notable for the following components:      Result Value   BUN 28 (*)    Creatinine, Ser 1.62 (*)    Calcium 8.5 (*)    GFR,  Estimated 44 (*)    All other components within normal limits  CBC WITH DIFFERENTIAL/PLATELET - Abnormal; Notable for the following components:   RBC 4.13 (*)    Hemoglobin 12.0 (*)    MCHC 29.7 (*)    Abs Immature Granulocytes 0.21 (*)    All other components within normal limits  URINALYSIS, W/ REFLEX TO CULTURE (INFECTION SUSPECTED) - Abnormal; Notable for the following components:   Color, Urine YELLOW (*)    APPearance HAZY (*)    Nitrite POSITIVE (*)    Leukocytes,Ua LARGE (*)    Bacteria, UA RARE (*)    All other components within normal limits  VALPROIC ACID LEVEL - Abnormal; Notable for the following components:   Valproic Acid Lvl 104 (*)    All other components within normal limits  URINE CULTURE  LAMOTRIGINE LEVEL  TROPONIN I (HIGH SENSITIVITY)  TROPONIN I (HIGH SENSITIVITY)   EKG ED ECG REPORT I, Merwyn Katos, the attending physician, personally viewed and interpreted this ECG. Date: 07/30/2023 EKG Time: 2119 Rate: 58 Rhythm: normal sinus rhythm QRS Axis: normal Intervals: First-degree AV block ST/T Wave abnormalities: normal Narrative Interpretation: Normal sinus rhythm with  first-degree AV block.  No evidence of acute ischemia PROCEDURES: Critical Care performed: No .1-3 Lead EKG Interpretation  Performed by: Merwyn Katos, MD Authorized by: Merwyn Katos, MD     Interpretation: normal     ECG rate:  71   ECG rate assessment: normal     Rhythm: sinus rhythm     Ectopy: none     Conduction: normal    MEDICATIONS ORDERED IN ED: Medications  cefTRIAXone (ROCEPHIN) 1 g in sodium chloride 0.9 % 100 mL IVPB (1 g Intravenous New Bag/Given 07/30/23 2223)  levETIRAcetam (KEPPRA) IVPB 1000 mg/100 mL premix (0 mg Intravenous Stopped 07/30/23 2158)   IMPRESSION / MDM / ASSESSMENT AND PLAN / ED COURSE  I reviewed the triage vital signs and the nursing notes.                             The patient is on the cardiac monitor to evaluate for evidence of  arrhythmia and/or significant heart rate changes. Patient's presentation is most consistent with acute presentation with potential threat to life or bodily function. Patient presents for seizure aura.  No e/o epididymo-orchitis on exam and low suspicion for rectal abscess, prostatitis, other GU deep space infection, gonorrhea/chlamydia. Unlikely Infected Urolithiasis, AAA, cholecystitis, pancreatitis, SBO, appendicitis, or other acute abdomen. Workup: UA: Nitrite positive, large leukocytes, with rare bacteria Rx: Cefdinir 300 mg twice daily x5 days  Disposition: Discharge home. SRP discussed. Advise follow up with primary care provider within 24-72 hours.   FINAL CLINICAL IMPRESSION(S) / ED DIAGNOSES   Final diagnoses:  Urinary tract infection without hematuria, site unspecified   Rx / DC Orders   ED Discharge Orders          Ordered    cefdinir (OMNICEF) 300 MG capsule  2 times daily        07/30/23 2250           Note:  This document was prepared using Dragon voice recognition software and may include unintentional dictation errors.   Merwyn Katos, MD 07/30/23 [id number]

## 2023-07-30 NOTE — ED Triage Notes (Signed)
Pt BIB EMS from [facility] for an aura he states that he feels as if he's going to have a seizure. No actual seizure visualized.

## 2023-08-01 LAB — LAMOTRIGINE LEVEL: Lamotrigine Lvl: 8.3 ug/mL (ref 2.0–20.0)

## 2023-08-01 LAB — URINE CULTURE: Culture: >=100000 — AB

## 2023-08-11 ENCOUNTER — Other Ambulatory Visit: Payer: Self-pay | Admitting: Physical Medicine & Rehabilitation

## 2023-08-11 DIAGNOSIS — M5412 Radiculopathy, cervical region: Secondary | ICD-10-CM

## 2023-08-13 ENCOUNTER — Ambulatory Visit: Payer: Medicare Other | Admitting: Surgery

## 2023-08-18 ENCOUNTER — Inpatient Hospital Stay: Payer: Medicare Other

## 2023-08-18 ENCOUNTER — Other Ambulatory Visit: Payer: Self-pay

## 2023-08-18 ENCOUNTER — Emergency Department: Payer: Medicare Other

## 2023-08-18 ENCOUNTER — Inpatient Hospital Stay
Admission: EM | Admit: 2023-08-18 | Discharge: 2023-08-19 | DRG: 101 | Disposition: A | Discharge status: Other Acute Inpatient Hospital | Payer: Medicare Other | Source: Skilled Nursing Facility | Attending: Internal Medicine | Admitting: Internal Medicine

## 2023-08-18 DIAGNOSIS — Z933 Colostomy status: Secondary | ICD-10-CM | POA: Diagnosis not present

## 2023-08-18 DIAGNOSIS — I5032 Chronic diastolic (congestive) heart failure: Secondary | ICD-10-CM | POA: Diagnosis present

## 2023-08-18 DIAGNOSIS — E785 Hyperlipidemia, unspecified: Secondary | ICD-10-CM | POA: Diagnosis present

## 2023-08-18 DIAGNOSIS — I69354 Hemiplegia and 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 side: Secondary | ICD-10-CM

## 2023-08-18 DIAGNOSIS — Z806 Family history of leukemia: Secondary | ICD-10-CM | POA: Diagnosis not present

## 2023-08-18 DIAGNOSIS — I2510 Atherosclerotic heart disease of native coronary artery without angina pectoris: Secondary | ICD-10-CM | POA: Diagnosis present

## 2023-08-18 DIAGNOSIS — R251 Tremor, unspecified: Secondary | ICD-10-CM | POA: Diagnosis present

## 2023-08-18 DIAGNOSIS — N179 Acute kidney failure, unspecified: Secondary | ICD-10-CM | POA: Diagnosis present

## 2023-08-18 DIAGNOSIS — R569 Unspecified convulsions: Secondary | ICD-10-CM | POA: Diagnosis not present

## 2023-08-18 DIAGNOSIS — I10 Essential (primary) hypertension: Secondary | ICD-10-CM | POA: Diagnosis present

## 2023-08-18 DIAGNOSIS — Z7982 Long term (current) use of aspirin: Secondary | ICD-10-CM

## 2023-08-18 DIAGNOSIS — Z87891 Personal history of nicotine dependence: Secondary | ICD-10-CM

## 2023-08-18 DIAGNOSIS — Z79899 Other long term (current) drug therapy: Secondary | ICD-10-CM

## 2023-08-18 DIAGNOSIS — Z8673 Personal history of transient ischemic attack (TIA), and cerebral infarction without residual deficits: Secondary | ICD-10-CM

## 2023-08-18 DIAGNOSIS — I130 Hypertensive heart and chronic kidney disease with heart failure and stage 1 through stage 4 chronic kidney disease, or unspecified chronic kidney disease: Secondary | ICD-10-CM | POA: Diagnosis present

## 2023-08-18 DIAGNOSIS — I480 Paroxysmal atrial fibrillation: Secondary | ICD-10-CM | POA: Diagnosis present

## 2023-08-18 DIAGNOSIS — Z96642 Presence of left artificial hip joint: Secondary | ICD-10-CM | POA: Diagnosis present

## 2023-08-18 DIAGNOSIS — Z8249 Family history of ischemic heart disease and other diseases of the circulatory system: Secondary | ICD-10-CM | POA: Diagnosis not present

## 2023-08-18 DIAGNOSIS — Z79890 Hormone replacement therapy (postmenopausal): Secondary | ICD-10-CM

## 2023-08-18 DIAGNOSIS — J449 Chronic obstructive pulmonary disease, unspecified: Secondary | ICD-10-CM | POA: Diagnosis present

## 2023-08-18 DIAGNOSIS — N1831 Chronic kidney disease, stage 3a: Secondary | ICD-10-CM | POA: Diagnosis present

## 2023-08-18 DIAGNOSIS — D696 Thrombocytopenia, unspecified: Secondary | ICD-10-CM

## 2023-08-18 DIAGNOSIS — Z85038 Personal history of other malignant neoplasm of large intestine: Secondary | ICD-10-CM

## 2023-08-18 DIAGNOSIS — G9341 Metabolic encephalopathy: Secondary | ICD-10-CM

## 2023-08-18 DIAGNOSIS — R633 Feeding difficulties, unspecified: Secondary | ICD-10-CM | POA: Diagnosis present

## 2023-08-18 DIAGNOSIS — I252 Old myocardial infarction: Secondary | ICD-10-CM

## 2023-08-18 DIAGNOSIS — R471 Dysarthria and anarthria: Secondary | ICD-10-CM | POA: Diagnosis present

## 2023-08-18 DIAGNOSIS — G40909 Epilepsy, unspecified, not intractable, without status epilepticus: Principal | ICD-10-CM

## 2023-08-18 DIAGNOSIS — G40209 Localization-related (focal) (partial) symptomatic epilepsy and epileptic syndromes with complex partial seizures, not intractable, without status epilepticus: Principal | ICD-10-CM | POA: Diagnosis present

## 2023-08-18 LAB — URINALYSIS, ROUTINE W REFLEX MICROSCOPIC
Bilirubin Urine: NEGATIVE
Glucose, UA: NEGATIVE mg/dL
Hgb urine dipstick: NEGATIVE
Ketones, ur: 5 mg/dL — AB
Leukocytes,Ua: NEGATIVE
Nitrite: NEGATIVE
Protein, ur: NEGATIVE mg/dL
Specific Gravity, Urine: 1.019 (ref 1.005–1.030)
pH: 5 (ref 5.0–8.0)

## 2023-08-18 LAB — COMPREHENSIVE METABOLIC PANEL
ALT: 12 U/L (ref 0–44)
AST: 13 U/L — ABNORMAL LOW (ref 15–41)
Albumin: 3.5 g/dL (ref 3.5–5.0)
Alkaline Phosphatase: 40 U/L (ref 38–126)
Anion gap: 9 (ref 5–15)
BUN: 33 mg/dL — ABNORMAL HIGH (ref 8–23)
CO2: 26 mmol/L (ref 22–32)
Calcium: 9.1 mg/dL (ref 8.9–10.3)
Chloride: 106 mmol/L (ref 98–111)
Creatinine, Ser: 1.86 mg/dL — ABNORMAL HIGH (ref 0.61–1.24)
GFR, Estimated: 38 mL/min — ABNORMAL LOW (ref >60)
Glucose, Bld: 97 mg/dL (ref 70–99)
Potassium: 5.1 mmol/L (ref 3.5–5.1)
Sodium: 141 mmol/L (ref 135–145)
Total Bilirubin: <0.2 mg/dL (ref <1.2)
Total Protein: 7.3 g/dL (ref 6.5–8.1)

## 2023-08-18 LAB — CBC WITH DIFFERENTIAL/PLATELET
Abs Immature Granulocytes: 0.11 10*3/uL — ABNORMAL HIGH (ref 0.00–0.07)
Basophils Absolute: 0 10*3/uL (ref 0.0–0.1)
Basophils Relative: 0 %
Eosinophils Absolute: 0 10*3/uL (ref 0.0–0.5)
Eosinophils Relative: 0 %
HCT: 40.5 % (ref 39.0–52.0)
Hemoglobin: 12.1 g/dL — ABNORMAL LOW (ref 13.0–17.0)
Immature Granulocytes: 1 %
Lymphocytes Relative: 13 %
Lymphs Abs: 1.1 10*3/uL (ref 0.7–4.0)
MCH: 29.2 pg (ref 26.0–34.0)
MCHC: 29.9 g/dL — ABNORMAL LOW (ref 30.0–36.0)
MCV: 97.8 fL (ref 80.0–100.0)
Monocytes Absolute: 0.9 10*3/uL (ref 0.1–1.0)
Monocytes Relative: 10 %
Neutro Abs: 6.8 10*3/uL (ref 1.7–7.7)
Neutrophils Relative %: 76 %
Platelets: 117 10*3/uL — ABNORMAL LOW (ref 150–400)
RBC: 4.14 MIL/uL — ABNORMAL LOW (ref 4.22–5.81)
RDW: 13.7 % (ref 11.5–15.5)
WBC: 9 10*3/uL (ref 4.0–10.5)
nRBC: 0 % (ref 0.0–0.2)

## 2023-08-18 LAB — LACTIC ACID, PLASMA: Lactic Acid, Venous: 1 mmol/L (ref 0.5–1.9)

## 2023-08-18 LAB — RESP PANEL BY RT-PCR (RSV, FLU A&B, COVID)  RVPGX2
Influenza A by PCR: NEGATIVE
Influenza B by PCR: NEGATIVE
Resp Syncytial Virus by PCR: NEGATIVE
SARS Coronavirus 2 by RT PCR: NEGATIVE

## 2023-08-18 LAB — TROPONIN I (HIGH SENSITIVITY): Troponin I (High Sensitivity): 8 ng/L (ref <18)

## 2023-08-18 MED ORDER — PANTOPRAZOLE SODIUM 40 MG IV SOLR
40.0000 mg | INTRAVENOUS | Status: DC
  Administered 2023-08-18: 40 mg via INTRAVENOUS
  Filled 2023-08-18: qty 10

## 2023-08-18 MED ORDER — FLUTICASONE PROPIONATE 50 MCG/ACT NA SUSP
2.0000 | Freq: Every day | NASAL | Status: DC
  Filled 2023-08-18: qty 16

## 2023-08-18 MED ORDER — TIOTROPIUM BROMIDE MONOHYDRATE 18 MCG IN CAPS
18.0000 ug | ORAL_CAPSULE | Freq: Every day | RESPIRATORY_TRACT | Status: DC
  Filled 2023-08-18 (×2): qty 5

## 2023-08-18 MED ORDER — LEVETIRACETAM IN NACL 1500 MG/100ML IV SOLN
1500.0000 mg | Freq: Once | INTRAVENOUS | Status: AC
  Administered 2023-08-18: 1500 mg via INTRAVENOUS
  Filled 2023-08-18: qty 100

## 2023-08-18 MED ORDER — PREGABALIN 50 MG PO CAPS: 100.0000 mg | ORAL_CAPSULE | Freq: Every day | ORAL | Status: DC

## 2023-08-18 MED ORDER — LORAZEPAM 2 MG/ML IJ SOLN
2.0000 mg | Freq: Once | INTRAMUSCULAR | Status: AC
  Administered 2023-08-18: 2 mg via INTRAVENOUS
  Filled 2023-08-18: qty 1

## 2023-08-18 MED ORDER — SODIUM CHLORIDE 0.9 % IV SOLN
75.0000 mL/h | INTRAVENOUS | Status: DC
  Administered 2023-08-18 – 2023-08-19 (×3): 75 mL/h via INTRAVENOUS

## 2023-08-18 MED ORDER — ENOXAPARIN SODIUM 40 MG/0.4ML IJ SOSY
40.0000 mg | PREFILLED_SYRINGE | INTRAMUSCULAR | Status: DC
  Administered 2023-08-18: 40 mg via SUBCUTANEOUS
  Filled 2023-08-18: qty 0.4

## 2023-08-18 MED ORDER — LEVETIRACETAM IN NACL 1000 MG/100ML IV SOLN
1000.0000 mg | Freq: Two times a day (BID) | INTRAVENOUS | Status: DC
  Administered 2023-08-18: 1000 mg via INTRAVENOUS
  Filled 2023-08-18: qty 100

## 2023-08-18 MED ORDER — TAMSULOSIN HCL 0.4 MG PO CAPS: 0.4000 mg | ORAL_CAPSULE | Freq: Every evening | ORAL | Status: DC

## 2023-08-18 MED ORDER — HYDRALAZINE HCL 20 MG/ML IJ SOLN
5.0000 mg | Freq: Four times a day (QID) | INTRAMUSCULAR | Status: DC | PRN
  Administered 2023-08-19: 5 mg via INTRAVENOUS
  Filled 2023-08-18: qty 1

## 2023-08-18 MED ORDER — MELATONIN 5 MG PO TABS: 10.0000 mg | ORAL_TABLET | Freq: Every day | ORAL | Status: DC

## 2023-08-18 MED ORDER — ONDANSETRON HCL 4 MG/2ML IJ SOLN: 4.0000 mg | Freq: Four times a day (QID) | INTRAMUSCULAR | Status: DC | PRN

## 2023-08-18 MED ORDER — ONDANSETRON HCL 4 MG PO TABS: 4.0000 mg | ORAL_TABLET | Freq: Four times a day (QID) | ORAL | Status: DC | PRN

## 2023-08-18 MED ORDER — CITALOPRAM HYDROBROMIDE 20 MG PO TABS: 10.0000 mg | ORAL_TABLET | Freq: Every day | ORAL | Status: DC

## 2023-08-18 MED ORDER — LAMOTRIGINE 25 MG PO TABS: 50.0000 mg | ORAL_TABLET | Freq: Two times a day (BID) | ORAL | Status: DC

## 2023-08-18 MED ORDER — LORAZEPAM 2 MG/ML IJ SOLN
4.0000 mg | INTRAMUSCULAR | Status: DC | PRN
Start: 2023-08-18 — End: 2023-08-19

## 2023-08-18 MED ORDER — VALPROATE SODIUM 100 MG/ML IV SOLN
500.0000 mg | Freq: Four times a day (QID) | INTRAVENOUS | Status: DC
  Administered 2023-08-18 – 2023-08-19 (×3): 500 mg via INTRAVENOUS
  Filled 2023-08-18 (×4): qty 5

## 2023-08-18 MED ORDER — BACLOFEN 10 MG PO TABS: 5.0000 mg | ORAL_TABLET | Freq: Two times a day (BID) | ORAL | Status: DC

## 2023-08-18 MED ORDER — ORAL CARE MOUTH RINSE
15.0000 mL | OROMUCOSAL | Status: DC
  Administered 2023-08-18 (×4): 15 mL via OROMUCOSAL
  Filled 2023-08-18 (×12): qty 15

## 2023-08-18 MED ORDER — ORAL CARE MOUTH RINSE: 15.0000 mL | OROMUCOSAL | Status: DC | PRN

## 2023-08-18 NOTE — ED Triage Notes (Signed)
See first nurse note. When this RN attempted to triage and assess pt, pt was unresponsive to painful stimuli. RR even and unlabored. First nurse notified. Pt moved to 11h. When arrived to 11h pt began mumbling and moving right arm. Will not follow commands. Attempted IV access with no success Dr Marisa Severin to bedside  [facility] RN at bedside

## 2023-08-18 NOTE — H&P (Signed)
History and Physical    Calvin [name] TFT:[id number] DOB: 1951-07-14 DOA: 08/18/2023  PCP: Lucita Ferrara, MD (Confirm with patient/family/NH records and if not entered, this has to be entered at [facility] point of entry) Patient coming from: SNF  I have personally briefly reviewed patient's old medical records in [facility]  Chief Complaint: Seizure  HPI: Calvin [name] is a 72 y.o. male with medical history significant of seizure disorder, CVA with residual left-sided hemiparesis, chronic HFpEF, CKD stage II, HTN, colon cancer status post transverse colectomy and colostomy, sent from nursing home for evaluation of altered mentations.  Patient is postictal and confused unable to provide any history, or history obtained by reviewing of ED record and nursing home record.  This morning around 920, patient was found confused, and started to have twitching like movement of left arm.  EMS was called and EMS arrived and found patient having left arm and hand shakiness.  EMS concerned about patient was having a seizure.  I also called patient's daughter over the phone, daughter reported that the patient was hospitalized last month for seizure and SBO, and since discharge patient has been having frequent episodes of confusion and significant deconditioning as he lost the ability to feed himself " he never recovered to where he was before admission"  ED Course: Afebrile, nontachycardic nonhypotensive nonhypoxic.  CT head showed no evidence of acute intracranial abnormalities.  UA negative for UTI.  Creatinine 1.8, BUN 33, bicarb 26, WBC 9.0, hemoglobin 12.  Patient was given Keppra IV load x 1 in the ED  Review of Systems: Unable to perform, patient is confused.  Past Medical History:  Diagnosis Date   Alcohol abuse    drinks on weekend   Anemia    Anxiety    Arthritis    Cancer (HCC)    colon,throat   COPD (chronic obstructive pulmonary disease) (HCC)    Coronary artery disease    Depression     Gout    Hemiplegia and 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 side (HCC)    Hypertension    Myocardial infarction (HCC)    Neuromuscular disorder (HCC)    Ogilvie syndrome    Seizures (HCC)    last 6 months ago   Stroke [facility])    multiple  left side weakness   Tremors of nervous system    Unstable angina [facility])     Past Surgical History:  Procedure Laterality Date   CARPAL TUNNEL RELEASE Left 10/19/2015   Procedure: CARPAL TUNNEL RELEASE;  Surgeon: Kennedy Bucker, MD;  Location: [facility] ORS;  Service: Orthopedics;  Laterality: Left;   COLON SURGERY     COLONOSCOPY WITH PROPOFOL N/A 10/26/2021   Procedure: COLONOSCOPY WITH PROPOFOL;  Surgeon: Regis Bill, MD;  Location: [facility] ENDOSCOPY;  Service: Endoscopy;  Laterality: N/A;   COLONOSCOPY WITH PROPOFOL N/A 06/02/2022   Procedure: COLONOSCOPY WITH PROPOFOL;  Surgeon: Regis Bill, MD;  Location: [facility] ENDOSCOPY;  Service: Endoscopy;  Laterality: N/A;   COLONOSCOPY WITH PROPOFOL N/A 06/06/2022   Procedure: COLONOSCOPY WITH PROPOFOL;  Surgeon: Regis Bill, MD;  Location: [facility] ENDOSCOPY;  Service: Endoscopy;  Laterality: N/A;   JOINT REPLACEMENT     left partial hip    THROAT SURGERY  2013   cancer     reports that he has quit smoking. His smoking use included cigarettes. He has never used smokeless tobacco. He reports that he does not currently use alcohol. He reports that he does  not use drugs.  No Known Allergies  Family History  Problem Relation Age of Onset   Cancer Mother    Hypertension Father    Leukemia Brother      Prior to Admission medications   Medication Sig Start Date End Date Taking? Authorizing Provider  acetaminophen (TYLENOL) 325 MG tablet Take 650 mg by mouth every 6 (six) hours.    [provider]  alum & mag hydroxide-simeth (MAALOX/MYLANTA) 200-200-20 MG/5ML suspension Take 30 mLs by mouth every 12 (twelve) hours as needed for indigestion or  heartburn.    [provider]  aspirin 81 MG chewable tablet Take 81 mg by mouth daily.    [provider]  baclofen (LIORESAL) 5 mg TABS tablet Take 5 mg by mouth 2 (two) times daily.    [provider]  bisacodyl (DULCOLAX) 10 MG suppository Place 1 suppository (10 mg total) rectally daily as needed for moderate constipation or mild constipation. 06/26/23   Sunnie Nielsen, DO  bisacodyl (DULCOLAX) 5 MG EC tablet Take 5 mg by mouth daily.    [provider]  citalopram (CELEXA) 10 MG tablet Take 10 mg by mouth daily. 06/04/23   [provider]  divalproex (DEPAKOTE) 500 MG DR tablet Take 2 tablets (1,000 mg total) by mouth 2 (two) times daily. 07/24/23   Marinda Elk, MD  enoxaparin (LOVENOX) 60 MG/0.6ML injection Inject 0.45 mLs (45 mg total) into the skin daily. 07/18/23   Jonah Blue, MD  fluticasone (FLONASE) 50 MCG/ACT nasal spray Place 2 sprays into both nostrils daily. 05/15/23   [provider]  lamoTRIgine (LAMICTAL) 25 MG tablet Take 50 mg by mouth 2 (two) times daily.    [provider]  levETIRAcetam (KEPPRA) 1000 MG tablet Take 1 tablet (1,000 mg total) by mouth 2 (two) times daily. 07/24/23   Marinda Elk, MD  lisinopril (ZESTRIL) 5 MG tablet Take 1 tablet (5 mg total) by mouth daily. 08/14/19   Dhungel, Theda Belfast, MD  Melatonin 10 MG TABS Take 10 mg by mouth at bedtime.    [provider]  metoprolol succinate (TOPROL-XL) 25 MG 24 hr tablet Take 25 mg by mouth daily.    [provider]  nitroGLYCERIN (NITROSTAT) 0.4 MG SL tablet Place 0.4 mg under the tongue every 5 (five) minutes as needed for chest pain.    [provider]  ondansetron (ZOFRAN-ODT) 4 MG disintegrating tablet Take 4 mg by mouth every 8 (eight) hours as needed for nausea or vomiting.    [provider]  pantoprazole (PROTONIX) 40 MG tablet Take 40 mg by mouth in the morning.    [provider]  polyethylene glycol (MIRALAX / GLYCOLAX) 17 g packet Take 34 g by mouth daily. 06/30/22   Lurene Shadow, MD  pregabalin (LYRICA) 50 MG capsule Take 50 mg capsule twice daily and 100 mg (two capsules) at bedtime 06/26/23   Sunnie Nielsen, DO  prochlorperazine (COMPAZINE) 10 MG/2ML injection Inject 2 mLs (10 mg total) into the vein every 6 (six) hours as needed. 07/17/23   Jonah Blue, MD  senna (SENOKOT) 8.6 MG TABS tablet Take 2 tablets (17.2 mg total) by mouth at bedtime as needed for mild constipation or moderate constipation. 06/26/23   Sunnie Nielsen, DO  simvastatin (ZOCOR) 20 MG tablet Take 20 mg by mouth at bedtime.    [provider]  tamsulosin (FLOMAX) 0.4 MG CAPS capsule Take 0.4 mg by mouth every evening.    [provider]  tiotropium (SPIRIVA) 18 MCG inhalation capsule Place 18 mcg into inhaler and inhale daily.    [provider]  trolamine salicylate (ASPERCREME) 10 % cream Apply topically 2 (two) times daily as needed for muscle pain. 07/17/23   Jonah Blue, MD  Wheat Dextrin (BENEFIBER DRINK MIX PO) Take 5 g by mouth daily.    [provider]    Physical Exam: Vitals:   08/18/23 1139 08/18/23 1330 08/18/23 1400 08/18/23 1430  BP: (!) 154/90 (!) 140/119 119/72 113/79  Pulse: 67 84 71 69  Resp: 18 16 13 14   SpO2: 100% 100% 99% 100%  Weight:      Height:        Constitutional: NAD, calm, comfortable Vitals:   08/18/23 1139 08/18/23 1330 08/18/23 1400 08/18/23 1430  BP: (!) 154/90 (!) 140/119 119/72 113/79  Pulse: 67 84 71 69  Resp: 18 16 13 14   SpO2: 100% 100% 99% 100%  Weight:      Height:       Eyes: PERRL, lids and conjunctivae normal ENMT: Mucous membranes are dry. Posterior pharynx clear of any exudate or lesions.Normal dentition.  Neck: normal, supple, no masses, no thyromegaly Respiratory: clear to auscultation bilaterally, no wheezing, no crackles. Normal respiratory effort. No accessory muscle use.   Cardiovascular: Regular rate and rhythm, no murmurs / rubs / gallops. No extremity edema. 2+ pedal pulses. No carotid bruits.  Abdomen: no tenderness, no masses palpated. No hepatosplenomegaly. Bowel sounds positive.  Musculoskeletal: no clubbing / cyanosis. No joint deformity upper and lower extremities. Good ROM, no contractures. Normal muscle tone.  Skin: no rashes, lesions, ulcers. No induration Neurologic: No facial droops, chronic left-sided weakness but noticeable twitching like movements 3 to 4 hertz Psychiatric: Awake, oriented to himself and place, confused about time    Labs on Admission: I have personally reviewed following labs and imaging studies  CBC: Recent Labs  Lab 08/18/23 1115  WBC 9.0  NEUTROABS 6.8  HGB 12.1*  HCT 40.5  MCV 97.8  PLT 117*   Basic Metabolic Panel: Recent Labs  Lab 08/18/23 1115  NA 141  K 5.1  CL 106  CO2 26  GLUCOSE 97  BUN 33*  CREATININE 1.86*  CALCIUM 9.1   GFR: Estimated Creatinine Clearance: 40.2 mL/min (A) (by C-G formula based on SCr of 1.86 mg/dL (H)). Liver Function Tests: Recent Labs  Lab 08/18/23 1115  AST 13*  ALT 12  ALKPHOS 40  BILITOT <0.2  PROT 7.3  ALBUMIN 3.5   No results for input(s): "LIPASE", "AMYLASE" in the last 168 hours. No results for input(s): "AMMONIA" in the last 168 hours. Coagulation Profile: No results for input(s): "INR", "PROTIME" in the last 168 hours. Cardiac Enzymes: No results for input(s): "CKTOTAL", "CKMB", "CKMBINDEX", "TROPONINI" in the last 168 hours. BNP (last 3 results) No results for input(s): "PROBNP" in the last 8760 hours. HbA1C: No results for input(s): "HGBA1C" in the last 72 hours. CBG: No results for input(s): "GLUCAP" in the last 168 hours. Lipid Profile: No results for input(s): "CHOL", "HDL", "LDLCALC", "TRIG", "CHOLHDL", "LDLDIRECT" in the last 72 hours. Thyroid Function Tests: No results for input(s): "TSH", "T4TOTAL", "FREET4", "T3FREE", "THYROIDAB" in the  last 72 hours. Anemia Panel: No results for input(s): "VITAMINB12", "FOLATE", "FERRITIN", "TIBC", "IRON", "RETICCTPCT" in the last 72 hours. Urine analysis:    Component Value Date/Time   COLORURINE YELLOW (A) 08/18/2023 1315   APPEARANCEUR CLEAR (A) 08/18/2023 1315   APPEARANCEUR Clear 08/14/2014 0933  LABSPEC 1.019 08/18/2023 1315   LABSPEC 1.015 08/14/2014 0933   PHURINE 5.0 08/18/2023 1315   GLUCOSEU NEGATIVE 08/18/2023 1315   GLUCOSEU Negative 08/14/2014 0933   HGBUR NEGATIVE 08/18/2023 1315   BILIRUBINUR NEGATIVE 08/18/2023 1315   BILIRUBINUR Negative 08/14/2014 0933   KETONESUR 5 (A) 08/18/2023 1315   PROTEINUR NEGATIVE 08/18/2023 1315   UROBILINOGEN 0.2 06/12/2010 0103   NITRITE NEGATIVE 08/18/2023 1315   LEUKOCYTESUR NEGATIVE 08/18/2023 1315   LEUKOCYTESUR Negative 08/14/2014 0933    Radiological Exams on Admission: CT Head Wo Contrast Result Date: 08/18/2023 CLINICAL DATA:  Mental status change, unknown cause EXAM: CT HEAD WITHOUT CONTRAST TECHNIQUE: Contiguous axial images were obtained from the base of the skull through the vertex without intravenous contrast. RADIATION DOSE REDUCTION: This exam was performed according to the departmental dose-optimization program which includes automated exposure control, adjustment of the mA and/or kV according to patient size and/or use of iterative reconstruction technique. COMPARISON:  CT head July 17, 2023. MRI head July 18, 2023 FINDINGS: Brain: Similar multifocal encephalomalacia, including in the inferior right greater than left frontal lobes, more superior right frontal lobe, and right parieto-occipital region. Small remote left cerebellar infarct. No evidence of acute large vascular territory infarct, acute hemorrhage, mass lesion, midline shift or hydrocephalus. Vascular: Calcific atherosclerosis. No hyperdense vessel identified. Skull: No acute fracture. Sinuses/Orbits: Mostly clear sinuses.  No acute orbital findings.  Other: Mastoid effusions. IMPRESSION: 1. No evidence of acute intracranial abnormality. 2. Similar multifocal encephalomalacia, detailed above. Electronically Signed   By: Feliberto Harts M.D.   On: 08/18/2023 12:22    EKG: Independently reviewed.  Sinus rhythm, noisy baseline unable to evaluate ST  Assessment/Plan Principal Problem:   Seizure (HCC)  (please populate well all problems here in Problem List. (For example, if patient is on BP meds at home and you resume or decide to hold them, it is a problem that needs to be her. Same for CAD, COPD, HLD and so on)  Breakthrough seizure History of complex partial seizure -Discussed the case with on-call neurology Dr. Wilford Corner, also went through patient nursing home record which shows patient has been taking antiseizure medications on schedule which including Keppra, Lamictal and Depakote and Lyrica. -Neurology ordered Ativan x 1 to see the response of left-sided shakiness.  EEG not available at [facility] on weekend, neurology recommended close monitoring for 24 hours if no significant improvement consider transfer to [facility] at [place] for continuous EEG monitoring. -Other DDx, hold off stroke workup for now.  Acute encephalopathy -Might be postictal, workup and management plan as above -Given the risk of repeated breakthrough seizure, will keep patient n.p.o., start maintenance IV fluid.  Speech evaluation to follow.  HTN -Hold off p.o. BP meds, start as needed hydralazine  History of CVA -Resume aspirin and statin when able to take p.o.  DVT prophylaxis: Lovenox Code Status: Full code Family Communication: Daughter over the phone Disposition Plan: Patient is sick with breakthrough seizures and altered mentations, requiring inpatient workup and close monitoring, expect more than 2 midnight hospital stay Consults called: Curbside consult with neurology, neurology will come back to see patient tomorrow morning to decide whether patient  will need transferred to [facility] Admission status: PCU admit   Emeline General MD [facility] Pager [phone]  08/18/2023, 2:50 PM

## 2023-08-18 NOTE — Consult Note (Signed)
NEUROLOGY CONSULT NOTE   Date of service: August 18, 2023 [name] MRN:  [id number] DOB:  [birth year] Chief Complaint: "Seizures and confusion" Requesting Provider: Dionne Bucy, MD  History of Present Illness  Calvin [name] is a 72 y.o. 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  The left arm shaking lasted about a couple of minutes and then the patient became less responsive per report.  No family member or attendant is at bedside to provide more details. According to the EDP notes, staff was concerned for stroke but when EMS arrived they saw his left arm shaking and were concerned for a seizure.  Patient was minimally responsive while in triage area and started to wake up and he was brought back into the care area. I was called for consultation this afternoon.  Upon my arrival, the patient still had some left arm twitching and appeared very confused. Going by the neurology notes from last admission and November, he was awake alert and oriented to time place and person which he definitely is not at this time.  According to the admitting hospitalist, the patient's daughter reported that he has not been back to his baseline in terms of mentation since last admission.  I tried to call the daughter on her phone but my call was unanswered at this time.  He was discharged during last admission with Keppra, Depakote, lamotrigine as antiepileptics and was recommended to taper off Depakote and increased dose of lamotrigine as outpatient but I do  not think that has been done up until yet based on the review of the [facility] from the facility.  LKW: Unclear Modified rankin score: 4-Needs assistance to walk and tend to bodily needs IV Thrombolysis: Outside the window due to unclear last known well EVT: Poor modified Rankin precludes EVT NIHSS 1a Level of Conscious.: 1 1b LOC Questions: 2 1c LOC Commands: 2 2 Best Gaze: 0 3 Visual: 0 4 Facial Palsy: 0 5a Motor Arm - left: 3 5b Motor Arm - Right: 2 6a Motor Leg - Left: 3 6b Motor Leg - Right: 2 7 Limb Ataxia: 0 8 Sensory: 0 9 Best Language: 2 10 Dysarthria: 2 11 Extinct. and Inatten.: 0 TOTAL: 19    ROS  Unable to perform due to his altered mentation  Past History   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 side (HCC)    Hypertension    Myocardial infarction (HCC)    Neuromuscular disorder (HCC)    Ogilvie syndrome    Seizures (HCC)    last 6 months ago   Stroke [facility])    multiple  left side weakness   Tremors of nervous system    Unstable angina (HCC)    Past Surgical History:  Procedure Laterality Date  CARPAL TUNNEL RELEASE Left 10/19/2015   Procedure: CARPAL TUNNEL RELEASE;  Surgeon: Kennedy Bucker, MD;  Location: [facility] ORS;  Service: Orthopedics;  Laterality: Left;   COLON SURGERY     COLONOSCOPY WITH PROPOFOL N/A 10/26/2021   Procedure: COLONOSCOPY WITH PROPOFOL;  Surgeon: Regis Bill, MD;  Location: [facility] ENDOSCOPY;  Service: Endoscopy;  Laterality: N/A;   COLONOSCOPY WITH PROPOFOL N/A 06/02/2022   Procedure: COLONOSCOPY WITH PROPOFOL;  Surgeon: Regis Bill, MD;  Location: [facility] ENDOSCOPY;  Service: Endoscopy;  Laterality: N/A;   COLONOSCOPY WITH PROPOFOL  N/A 06/06/2022   Procedure: COLONOSCOPY WITH PROPOFOL;  Surgeon: Regis Bill, MD;  Location: [facility] ENDOSCOPY;  Service: Endoscopy;  Laterality: N/A;   JOINT REPLACEMENT     left partial hip    THROAT SURGERY  2013   cancer   Family History: Family History  Problem Relation Age of Onset   Cancer Mother    Hypertension Father    Leukemia Brother    Social History  reports that he has quit smoking. His smoking use included cigarettes. He has never used smokeless tobacco. He reports that he does not currently use alcohol. He reports that he does not use drugs.  No Known Allergies  Medications   Current Facility-Administered Medications:    LORazepam (ATIVAN) injection 2 mg, 2 mg, Intravenous, Once, Mikey College T, MD  Current Outpatient Medications:    acetaminophen (TYLENOL) 325 MG tablet, Take 650 mg by mouth every 6 (six) hours., Disp: , Rfl:    alum & mag hydroxide-simeth (MAALOX/MYLANTA) 200-200-20 MG/5ML suspension, Take 30 mLs by mouth every 12 (twelve) hours as needed for indigestion or heartburn., Disp: , Rfl:    aspirin 81 MG chewable tablet, Take 81 mg by mouth daily., Disp: , Rfl:    baclofen (LIORESAL) 5 mg TABS tablet, Take 5 mg by mouth 2 (two) times daily., Disp: , Rfl:    bisacodyl (DULCOLAX) 10 MG suppository, Place 1 suppository (10 mg total) rectally daily as needed for moderate constipation or mild constipation., Disp: , Rfl:    bisacodyl (DULCOLAX) 5 MG EC tablet, Take 5 mg by mouth daily., Disp: , Rfl:    citalopram (CELEXA) 10 MG tablet, Take 10 mg by mouth daily., Disp: , Rfl:    divalproex (DEPAKOTE) 500 MG DR tablet, Take 2 tablets (1,000 mg total) by mouth 2 (two) times daily., Disp: , Rfl:    enoxaparin (LOVENOX) 60 MG/0.6ML injection, Inject 0.45 mLs (45 mg total) into the skin daily., Disp: , Rfl:    fluticasone (FLONASE) 50 MCG/ACT nasal spray, Place 2 sprays into both nostrils daily., Disp: , Rfl:    lamoTRIgine (LAMICTAL) 25 MG tablet, Take 50 mg  by mouth 2 (two) times daily., Disp: , Rfl:    levETIRAcetam (KEPPRA) 1000 MG tablet, Take 1 tablet (1,000 mg total) by mouth 2 (two) times daily., Disp: , Rfl:    lisinopril (ZESTRIL) 5 MG tablet, Take 1 tablet (5 mg total) by mouth daily., Disp: 30 tablet, Rfl: 0   Melatonin 10 MG TABS, Take 10 mg by mouth at bedtime., Disp: , Rfl:    metoprolol succinate (TOPROL-XL) 25 MG 24 hr tablet, Take 25 mg by mouth daily., Disp: , Rfl:    nitroGLYCERIN (NITROSTAT) 0.4 MG SL tablet, Place 0.4 mg under the tongue every 5 (five) minutes as needed for chest pain., Disp: , Rfl:    ondansetron (ZOFRAN-ODT) 4 MG disintegrating tablet, Take 4 mg by mouth every 8 (eight) hours as needed  for nausea or vomiting., Disp: , Rfl:    pantoprazole (PROTONIX) 40 MG tablet, Take 40 mg by mouth in the morning., Disp: , Rfl:    polyethylene glycol (MIRALAX / GLYCOLAX) 17 g packet, Take 34 g by mouth daily., Disp: , Rfl:    pregabalin (LYRICA) 50 MG capsule, Take 50 mg capsule twice daily and 100 mg (two capsules) at bedtime, Disp: 30 capsule, Rfl: 0   prochlorperazine (COMPAZINE) 10 MG/2ML injection, Inject 2 mLs (10 mg total) into the vein every 6 (six) hours as needed., Disp: , Rfl:    senna (SENOKOT) 8.6 MG TABS tablet, Take 2 tablets (17.2 mg total) by mouth at bedtime as needed for mild constipation or moderate constipation., Disp: , Rfl:    simvastatin (ZOCOR) 20 MG tablet, Take 20 mg by mouth at bedtime., Disp: , Rfl:    tamsulosin (FLOMAX) 0.4 MG CAPS capsule, Take 0.4 mg by mouth every evening., Disp: , Rfl:    tiotropium (SPIRIVA) 18 MCG inhalation capsule, Place 18 mcg into inhaler and inhale daily., Disp: , Rfl:    trolamine salicylate (ASPERCREME) 10 % cream, Apply topically 2 (two) times daily as needed for muscle pain., Disp: , Rfl:    Wheat Dextrin (BENEFIBER DRINK MIX PO), Take 5 g by mouth daily., Disp: , Rfl:   Vitals   Vitals:   26-Aug-2023 1139 08/26/2023 1330 Aug 26, 2023 1400 08-26-2023 1430  BP: (!) 154/90  (!) 140/119 119/72 113/79  Pulse: 67 84 71 69  Resp: 18 16 13 14   SpO2: 100% 100% 99% 100%  Weight:      Height:        Body mass index is 27.89 kg/m.  Physical Exam  General: Drowsy and lethargic, somewhat sleepy in no acute distress HEENT: Normocephalic atraumatic Lungs: Clear Cardiovascular: Irregularly irregular Extremities warm well-perfused Neurological exam Very drowsy and lethargic and somewhat sleepy.  Opens eyes to voice. Does not consistently follow commands at all Speech is severely dysarthric. Cranial nerves: Pupils equal round react light, blinks to threat from both sides, face is grossly symmetric, tongue and palate midline Motor examination with left upper extremity continuous tremor versus focal seizure with mildly increased tone with no effort against gravity.  Left leg also has mildly increased tone with no effort against gravity.  Right side has spontaneous movement but he does not follow commands enough to keep the arm up for 10 or leg up for 5 seconds at all. Sensation: Grimaces to noxious stimulation in all 4 Coordination difficult to assess given his mentation  Labs/Imaging/Neurodiagnostic studies   CBC:  Recent Labs  Lab 08/26/2023 1115  WBC 9.0  NEUTROABS 6.8  HGB 12.1*  HCT 40.5  MCV 97.8  PLT 117*   Basic Metabolic Panel:  Lab Results  Component Value Date   NA 141 2023-08-26   K 5.1 26-Aug-2023   CO2 26 08-26-23   GLUCOSE 97 08-26-23   BUN 33 (H) Aug 26, 2023   CREATININE 1.86 (H) 2023-08-26   CALCIUM 9.1 2023-08-26   GFRNONAA 38 (L) 08-26-23   GFRAA 58 (L) 10/12/2019   Lipid Panel:  Lab Results  Component Value Date   LDLCALC 57 05/29/2022   HgbA1c:  Lab Results  Component Value Date   HGBA1C 5.8 (H) 05/28/2022   Urine Drug Screen:     Component Value Date/Time   LABOPIA NEGATIVE 08/14/2014 0933   COCAINSCRNUR NEGATIVE 08/14/2014 0933   LABBENZ NEGATIVE 08/14/2014 0933   AMPHETMU NEGATIVE 08/14/2014 0933   THCU  NEGATIVE 08/14/2014 0933   LABBARB NEGATIVE 08/14/2014 0933    Alcohol Level No results found for: "[facility]" INR  Lab Results  Component Value Date   INR 1.2 06/01/2022   APTT  Lab Results  Component Value Date   APTT 35 06/01/2022   AED levels:  Lab Results  Component Value Date   PHENYTOIN 20.8 (H) 06/28/2014   LAMOTRIGINE 8.3 07/30/2023   LEVETIRACETA 32.4 05/28/2022    CT Head without contrast(Personally reviewed): No acute changes.  Unchanged encephalomalacia there is multifocal and includes right greater than left frontal lobes, more superior right frontal lobe and right parietal occipital region along with small remote left cerebellar infarct.  No evidence of acute large vessel territory infarct or acute hemorrhage.  No evidence of mass lesion midline shift or hydrocephalus.  ASSESSMENT   Calvin [name] is a 72 y.o. male with past history of multiple strokes including that in the right MCA territory with left hemiparesis residual and complicated by poststroke epilepsy on multiple AEDs, hypertension, CAD, paroxysmal atrial flutter/fibrillation on anticoagulation, colon cancer status post colostomy complicated by Ogilvie syndrome presenting from nursing facility for concern for breakthrough seizure-left arm shaking followed by depressed level of consciousness and confusion. Given the fact that he is not on anticoagulation, stroke should be on the differentials.  It is more likely that he probably had focal seizures with secondary generalization and is still postictal.  I did notice some left arm shaking at the time that I saw him.  Impression: Breakthrough seizure in a patient with known seizures-poststroke epilepsy Prior strokes with residual left hemiparesis as the most prominent feature Evaluate for new stroke   RECOMMENDATIONS  Check Depakote level Continue home Keppra, Depakote, lamotrigine and pregabalin for now.  I am not sure if he is able to get his lamotrigine and  pregabalin with his current mentation, so the Keppra and Depakote can be converted IV one-to-one for now. It was recommended during last admission that his lamotrigine be upped and Depakote tapered down-which has not been done per the [facility] records from the facility. I will recommend giving him a one-time dose of Ativan and observing him overnight.   MRI brain without contrast to rule out new stroke An EEG would be desired but unfortunately facilities for EEG are not available today and tomorrow at this hospital.  If he remains altered by tomorrow, will consider transferring to [facility] for EEG This was discussed with Dr. Chipper Herb, admitting hospitalist ______________________________________________________________________    Signed, Milon Dikes, MD [facility]

## 2023-08-18 NOTE — ED Notes (Signed)
Pt was cleaned up and changed--gown placed. Daughter of pt is in the room and stated that she had wanted to speak with the Dr.  Dr. Chipper Herb was informed and he stated that he already spoke with her.  Daughter is not understanding that and is requesting to speak with the doctor again

## 2023-08-18 NOTE — ED Provider Notes (Signed)
[facility] Provider Note    Event Date/Time   First MD Initiated Contact with Patient 08/18/23 1113     (approximate)   History   unresponsive   HPI  Calvin [name] is a 72 y.o. male with a history of hypertension, diastolic CHF, CKD, CVA with left-sided hemiparesis, and seizure disorder who presents with a change in mental status.  The patient is unable to provide any history.  Per EMS, the patient had an abrupt change in his mental status and became disoriented around 9:20 AM.  The staff was concerned for stroke, however EMS reported that the patient had left arm shaking for 2 minutes that were concerning for seizure.  Subsequently the patient was minimally responsive in triage and started to wake up when he was brought back to the care area.  I reviewed the past medical records.  The patient was most recently admitted to the hospitalist service in November with an SBO requiring treatment with an NG tube.  He also had breakthrough seizures during his admission.   Physical Exam   Triage Vital Signs: ED Triage Vitals  Encounter Vitals Group     BP      Systolic BP Percentile      Diastolic BP Percentile      Pulse      Resp      Temp      Temp src      SpO2      Weight      Height      Head Circumference      Peak Flow      Pain Score      Pain Loc      Pain Education      Exclude from Growth Chart     Most recent vital signs: Vitals:   08/18/23 1118 08/18/23 1139  BP:  (!) 154/90  Pulse:  67  Resp:  18  SpO2: 100% 100%     General: Alert, speaking but not following commands, no distress.  CV:  Good peripheral perfusion.  Resp:  Normal effort.  Abd:  No distention.  Other:  Left hemiparesis.  Motor intact in right upper and lower extremity.  EOMI.  PERRLA.  No photophobia.  No facial droop.   ED Results / Procedures / Treatments   Labs (all labs ordered are listed, but only abnormal results are displayed) Labs Reviewed   COMPREHENSIVE METABOLIC PANEL - Abnormal; Notable for the following components:      Result Value   BUN 33 (*)    Creatinine, Ser 1.86 (*)    AST 13 (*)    GFR, Estimated 38 (*)    All other components within normal limits  CBC WITH DIFFERENTIAL/PLATELET - Abnormal; Notable for the following components:   RBC 4.14 (*)    Hemoglobin 12.1 (*)    MCHC 29.9 (*)    Platelets 117 (*)    Abs Immature Granulocytes 0.11 (*)    All other components within normal limits  RESP PANEL BY RT-PCR (RSV, FLU A&B, COVID)  [id number]  LACTIC ACID, PLASMA  URINALYSIS, ROUTINE W REFLEX MICROSCOPIC  LEVETIRACETAM LEVEL  TROPONIN I (HIGH SENSITIVITY)     EKG  ED ECG REPORT I, Dionne Bucy, the attending physician, personally viewed and interpreted this ECG.  Date: 08/18/2023 EKG Time: 1308 Rate: 73 Rhythm: normal sinus rhythm QRS Axis: normal Intervals: Nonspecific IVCD ST/T Wave abnormalities: normal Narrative Interpretation: no evidence of acute ischemia  RADIOLOGY  CT head: I independently viewed and interpreted the images; there is no ICH.  Radiology report indicates no acute abnormality.    PROCEDURES:  Critical Care performed: No  Procedures   MEDICATIONS ORDERED IN ED: Medications  levETIRAcetam (KEPPRA) IVPB 1500 mg/ 100 mL premix (0 mg Intravenous Stopped 08/18/23 1150)     IMPRESSION / MDM / ASSESSMENT AND PLAN / ED COURSE  I reviewed the triage vital signs and the nursing notes.  72 year old male with PMH as noted above presents with acute onset of altered mental status and decreased responsiveness along with some possible seizure activity witnessed by EMS.  Initially on arrival to the the patient was minimally responsive.  During my exam he was more alert, talking to himself and not following commands but moving both right extremities with no seizure activity or acute neurologic deficits.  Differential diagnosis includes, but is not limited to, breakthrough  seizure/postictal state, altered mental status due to other etiologies such as UTI, electrolyte abnormality, other metabolic disturbance.  I have a low suspicion for acute CVA/TIA given the lack of any focal neurologic findings.  We will obtain CT head, lab workup, Keppra level, load with IV Keppra, and reassess.  Patient's presentation is most consistent with acute presentation with potential threat to life or bodily function.  The patient is on the cardiac monitor to evaluate for evidence of arrhythmia and/or significant heart rate changes.  ----------------------------------------- 1:13 PM on 08/18/2023 -----------------------------------------  CT head is negative for acute findings.  Initial labs are unremarkable.  CMP shows no significant electrolyte abnormalities.  CBC shows no leukocytosis.  Troponin and lactate are normal.  On reassessment, the patient remains awake but appears somewhat lethargic and confused.  He has tremor particularly in the left arm which is very fine.  There is mild tremor in the right arm.  However, he is able to follow commands including sticking out his tongue and can fully lift the arm on the right side there is no evidence of ongoing seizure activity or nonconvulsive status.  He has been loaded with IV Keppra.  He will need admission for further workup and monitoring.  ----------------------------------------- 1:40 PM on 08/18/2023 -----------------------------------------  I consulted Dr. Chipper Herb from the hospitalist service; based on our discussion he agrees to evaluate the patient for admission.   FINAL CLINICAL IMPRESSION(S) / ED DIAGNOSES   Final diagnoses:  Seizure disorder (HCC)     Rx / DC Orders   ED Discharge Orders     None        Note:  This document was prepared using Dragon voice recognition software and may include unintentional dictation errors.    Dionne Bucy, MD 08/18/23 1340

## 2023-08-18 NOTE — ED Triage Notes (Signed)
Patient arrived by [facility] from [facility]. Staff concerned for stroke. Reports change in orientation at 9:20am today.  EMS reports left arm shaking and unresponsive for 2 1/2 minutes per EMS. Reports seizure like activity.  EMS vitals: Baseline 3L O2 113 CBG 70HR 95% RA 126/69 b/p

## 2023-08-19 ENCOUNTER — Encounter (HOSPITAL_COMMUNITY): Payer: Self-pay

## 2023-08-19 ENCOUNTER — Inpatient Hospital Stay (HOSPITAL_COMMUNITY)
Admission: EM | Admit: 2023-08-19 | Discharge: 2023-08-23 | DRG: 100 | Disposition: A | Discharge status: Skilled Nursing Facility | Payer: Medicare Other | Source: Other Acute Inpatient Hospital | Attending: Internal Medicine | Admitting: Internal Medicine

## 2023-08-19 ENCOUNTER — Inpatient Hospital Stay (HOSPITAL_COMMUNITY): Payer: Medicare Other

## 2023-08-19 DIAGNOSIS — Z79899 Other long term (current) drug therapy: Secondary | ICD-10-CM

## 2023-08-19 DIAGNOSIS — N179 Acute kidney failure, unspecified: Secondary | ICD-10-CM | POA: Diagnosis present

## 2023-08-19 DIAGNOSIS — F32A Depression, unspecified: Secondary | ICD-10-CM | POA: Diagnosis present

## 2023-08-19 DIAGNOSIS — I252 Old myocardial infarction: Secondary | ICD-10-CM | POA: Diagnosis not present

## 2023-08-19 DIAGNOSIS — Z806 Family history of leukemia: Secondary | ICD-10-CM | POA: Diagnosis not present

## 2023-08-19 DIAGNOSIS — Z7982 Long term (current) use of aspirin: Secondary | ICD-10-CM | POA: Diagnosis not present

## 2023-08-19 DIAGNOSIS — G9389 Other specified disorders of brain: Secondary | ICD-10-CM | POA: Diagnosis present

## 2023-08-19 DIAGNOSIS — F419 Anxiety disorder, unspecified: Secondary | ICD-10-CM | POA: Diagnosis present

## 2023-08-19 DIAGNOSIS — Z87891 Personal history of nicotine dependence: Secondary | ICD-10-CM | POA: Diagnosis not present

## 2023-08-19 DIAGNOSIS — Z8673 Personal history of transient ischemic attack (TIA), and cerebral infarction without residual deficits: Secondary | ICD-10-CM | POA: Diagnosis not present

## 2023-08-19 DIAGNOSIS — I2510 Atherosclerotic heart disease of native coronary artery without angina pectoris: Secondary | ICD-10-CM | POA: Diagnosis present

## 2023-08-19 DIAGNOSIS — G9341 Metabolic encephalopathy: Secondary | ICD-10-CM | POA: Diagnosis present

## 2023-08-19 DIAGNOSIS — I130 Hypertensive heart and chronic kidney disease with heart failure and stage 1 through stage 4 chronic kidney disease, or unspecified chronic kidney disease: Secondary | ICD-10-CM | POA: Diagnosis present

## 2023-08-19 DIAGNOSIS — I10 Essential (primary) hypertension: Secondary | ICD-10-CM

## 2023-08-19 DIAGNOSIS — M62838 Other muscle spasm: Secondary | ICD-10-CM | POA: Diagnosis present

## 2023-08-19 DIAGNOSIS — Z8249 Family history of ischemic heart disease and other diseases of the circulatory system: Secondary | ICD-10-CM

## 2023-08-19 DIAGNOSIS — G40909 Epilepsy, unspecified, not intractable, without status epilepticus: Principal | ICD-10-CM | POA: Diagnosis present

## 2023-08-19 DIAGNOSIS — Z85038 Personal history of other malignant neoplasm of large intestine: Secondary | ICD-10-CM | POA: Diagnosis not present

## 2023-08-19 DIAGNOSIS — N400 Enlarged prostate without lower urinary tract symptoms: Secondary | ICD-10-CM | POA: Diagnosis present

## 2023-08-19 DIAGNOSIS — E875 Hyperkalemia: Secondary | ICD-10-CM | POA: Diagnosis present

## 2023-08-19 DIAGNOSIS — I5032 Chronic diastolic (congestive) heart failure: Secondary | ICD-10-CM | POA: Diagnosis present

## 2023-08-19 DIAGNOSIS — D696 Thrombocytopenia, unspecified: Secondary | ICD-10-CM

## 2023-08-19 DIAGNOSIS — R569 Unspecified convulsions: Principal | ICD-10-CM

## 2023-08-19 DIAGNOSIS — R627 Adult failure to thrive: Secondary | ICD-10-CM | POA: Diagnosis present

## 2023-08-19 DIAGNOSIS — N1831 Chronic kidney disease, stage 3a: Secondary | ICD-10-CM | POA: Diagnosis present

## 2023-08-19 DIAGNOSIS — I69352 Hemiplegia and hemiparesis following cerebral infarction affecting left dominant side: Secondary | ICD-10-CM

## 2023-08-19 DIAGNOSIS — Z7902 Long term (current) use of antithrombotics/antiplatelets: Secondary | ICD-10-CM

## 2023-08-19 DIAGNOSIS — J449 Chronic obstructive pulmonary disease, unspecified: Secondary | ICD-10-CM | POA: Diagnosis present

## 2023-08-19 DIAGNOSIS — N189 Chronic kidney disease, unspecified: Secondary | ICD-10-CM

## 2023-08-19 DIAGNOSIS — E1122 Type 2 diabetes mellitus with diabetic chronic kidney disease: Secondary | ICD-10-CM | POA: Diagnosis present

## 2023-08-19 DIAGNOSIS — Z96642 Presence of left artificial hip joint: Secondary | ICD-10-CM | POA: Diagnosis present

## 2023-08-19 MED ORDER — ENOXAPARIN SODIUM 40 MG/0.4ML IJ SOSY: 40.0000 mg | PREFILLED_SYRINGE | INTRAMUSCULAR | Status: DC

## 2023-08-19 MED ORDER — LEVETIRACETAM IN NACL 1000 MG/100ML IV SOLN: 1000.0000 mg | Freq: Two times a day (BID) | INTRAVENOUS | Status: DC

## 2023-08-19 MED ORDER — DICLOFENAC SODIUM 1 % EX GEL
2.0000 g | Freq: Four times a day (QID) | CUTANEOUS | Status: DC | PRN
Start: 2023-08-19 — End: 2023-08-23
  Administered 2023-08-19: 2 g via TOPICAL
  Filled 2023-08-19: qty 100

## 2023-08-19 MED ORDER — ACETAMINOPHEN 325 MG PO TABS
650.0000 mg | ORAL_TABLET | Freq: Four times a day (QID) | ORAL | Status: DC | PRN
  Administered 2023-08-20: 650 mg via ORAL
  Filled 2023-08-19: qty 2

## 2023-08-19 MED ORDER — ONDANSETRON HCL 4 MG/2ML IJ SOLN: 4.0000 mg | Freq: Four times a day (QID) | INTRAMUSCULAR | Status: DC | PRN

## 2023-08-19 MED ORDER — BACLOFEN 5 MG HALF TABLET
5.0000 mg | ORAL_TABLET | Freq: Two times a day (BID) | ORAL | Status: DC
  Administered 2023-08-20 – 2023-08-23 (×6): 5 mg via ORAL
  Filled 2023-08-19 (×7): qty 1

## 2023-08-19 MED ORDER — ASPIRIN 81 MG PO TBEC
81.0000 mg | DELAYED_RELEASE_TABLET | Freq: Every day | ORAL | Status: DC
  Filled 2023-08-19: qty 1

## 2023-08-19 MED ORDER — PANTOPRAZOLE SODIUM 40 MG IV SOLR
40.0000 mg | INTRAVENOUS | Status: DC
  Administered 2023-08-19 – 2023-08-20 (×2): 40 mg via INTRAVENOUS
  Filled 2023-08-19: qty 10

## 2023-08-19 MED ORDER — LAMOTRIGINE 100 MG PO TABS
50.0000 mg | ORAL_TABLET | Freq: Two times a day (BID) | ORAL | Status: DC
  Administered 2023-08-20 – 2023-08-23 (×7): 50 mg via ORAL
  Filled 2023-08-19 (×8): qty 1

## 2023-08-19 MED ORDER — POLYETHYLENE GLYCOL 3350 17 G PO PACK: 17.0000 g | PACK | Freq: Two times a day (BID) | ORAL | Status: DC

## 2023-08-19 MED ORDER — ORAL CARE MOUTH RINSE: 15.0000 mL | OROMUCOSAL | Status: DC

## 2023-08-19 MED ORDER — ENOXAPARIN SODIUM 40 MG/0.4ML IJ SOSY
40.0000 mg | PREFILLED_SYRINGE | INTRAMUSCULAR | Status: DC
  Administered 2023-08-19 – 2023-08-22 (×4): 40 mg via SUBCUTANEOUS
  Filled 2023-08-19 (×4): qty 0.4

## 2023-08-19 MED ORDER — ACETAMINOPHEN 650 MG RE SUPP: 650.0000 mg | Freq: Four times a day (QID) | RECTAL | Status: DC | PRN

## 2023-08-19 MED ORDER — TAMSULOSIN HCL 0.4 MG PO CAPS
0.4000 mg | ORAL_CAPSULE | Freq: Every evening | ORAL | Status: DC
  Administered 2023-08-20 – 2023-08-22 (×3): 0.4 mg via ORAL
  Filled 2023-08-19 (×3): qty 1

## 2023-08-19 MED ORDER — DEXTROSE 5 % IV SOLN
500.0000 mg | Freq: Four times a day (QID) | INTRAVENOUS | Status: DC
  Administered 2023-08-19 – 2023-08-20 (×4): 500 mg via INTRAVENOUS
  Filled 2023-08-19 (×9): qty 5

## 2023-08-19 MED ORDER — VALPROATE SODIUM 100 MG/ML IV SOLN: 500.0000 mg | Freq: Four times a day (QID) | INTRAVENOUS | Status: DC

## 2023-08-19 MED ORDER — POLYETHYLENE GLYCOL 3350 17 G PO PACK
17.0000 g | PACK | Freq: Two times a day (BID) | ORAL | Status: DC
  Administered 2023-08-20 – 2023-08-23 (×5): 17 g via ORAL
  Filled 2023-08-19 (×6): qty 1

## 2023-08-19 MED ORDER — PREGABALIN 100 MG PO CAPS
100.0000 mg | ORAL_CAPSULE | Freq: Every day | ORAL | Status: DC
  Administered 2023-08-20 – 2023-08-22 (×3): 100 mg via ORAL
  Filled 2023-08-19 (×3): qty 1

## 2023-08-19 MED ORDER — ONDANSETRON HCL 4 MG PO TABS: 4.0000 mg | ORAL_TABLET | Freq: Four times a day (QID) | ORAL | Status: DC | PRN

## 2023-08-19 MED ORDER — LACTULOSE 10 GM/15ML PO SOLN
20.0000 g | Freq: Every day | ORAL | Status: DC
Start: 2023-08-19 — End: 2023-08-22
  Administered 2023-08-21: 20 g via ORAL
  Filled 2023-08-19 (×2): qty 30

## 2023-08-19 MED ORDER — HYDRALAZINE HCL 20 MG/ML IJ SOLN: 5.0000 mg | Freq: Four times a day (QID) | INTRAMUSCULAR | Status: DC | PRN

## 2023-08-19 MED ORDER — HYDRALAZINE HCL 20 MG/ML IJ SOLN
5.0000 mg | Freq: Four times a day (QID) | INTRAMUSCULAR | Status: DC | PRN
  Administered 2023-08-19: 5 mg via INTRAVENOUS
  Filled 2023-08-19: qty 1

## 2023-08-19 MED ORDER — SODIUM CHLORIDE 0.9 % IV SOLN: 75.0000 mL | INTRAVENOUS | Status: DC

## 2023-08-19 MED ORDER — LEVETIRACETAM IN NACL 1000 MG/100ML IV SOLN
1000.0000 mg | Freq: Two times a day (BID) | INTRAVENOUS | Status: DC
  Administered 2023-08-19 – 2023-08-21 (×4): 1000 mg via INTRAVENOUS
  Filled 2023-08-19 (×5): qty 100

## 2023-08-19 MED ORDER — LORAZEPAM 2 MG/ML IJ SOLN: 1.0000 mg | INTRAMUSCULAR | Status: DC | PRN

## 2023-08-19 MED ORDER — PREGABALIN 25 MG PO CAPS
50.0000 mg | ORAL_CAPSULE | Freq: Two times a day (BID) | ORAL | Status: DC
  Administered 2023-08-20 – 2023-08-23 (×7): 50 mg via ORAL
  Filled 2023-08-19 (×8): qty 2

## 2023-08-19 MED ORDER — PANTOPRAZOLE SODIUM 40 MG IV SOLR: 40.0000 mg | INTRAVENOUS | Status: DC

## 2023-08-19 NOTE — Assessment & Plan Note (Signed)
Holding antihypertensive medications 

## 2023-08-19 NOTE — Assessment & Plan Note (Signed)
Continue aspirin 

## 2023-08-19 NOTE — Assessment & Plan Note (Signed)
Acute kidney injury on CKD stage IIIa.  Creatinine up to 1.86.  Continue IV fluids.  Hold lisinopril.

## 2023-08-19 NOTE — H&P (Signed)
History and Physical    Calvin [name] ZOX:[id number] DOB: [birth year] DOA: 08/19/2023  PCP: Keane Police, MD   Chief Complaint:ams  HPI: Calvin [name] is a 72 y.o. male with medical history significant of seizure disorder, CVA, heart failure with preserved ejection fraction, CKD stage II, hypertension, colon cancer status post colectomy who presented to the emergency department from a nursing home with altered mental status.  Patient was confused and unable to provide history at [facility].  At that time family reported patient's had frequent episodes of confusion and worsening failure to thrive.  He was afebrile and hemodynamically stable.  CT head was obtained which showed no acute intracranial abnormalities.  Laboratory workup revealed creatinine 1.8, WBC 9.0, hemoglobin 12, urinalysis negative for UTI MR head was obtained which showed no acute processes with unchanged old infarcts.  Neurology was consulted and recommended transfer to [facility] for EEG.  Patient was kept on IV Keppra and IV Depakote.  On evaluation patient was resting comfortably and largely noncontributory to his presentation.   Review of Systems: Review of Systems  Constitutional:  Negative for chills and fever.  All other systems reviewed and are negative.    As per HPI otherwise 10 point review of systems negative.   No Known Allergies  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 side (HCC)    Hypertension    Myocardial infarction (HCC)    Neuromuscular disorder (HCC)    Ogilvie syndrome    Seizures (HCC)    last 6 months ago   Stroke [facility])    multiple  left side weakness   Tremors of  nervous system    Unstable angina [facility])     Past Surgical History:  Procedure Laterality Date   CARPAL TUNNEL RELEASE Left 10/19/2015   Procedure: CARPAL TUNNEL RELEASE;  Surgeon: Kennedy Bucker, MD;  Location: [facility] ORS;  Service: Orthopedics;  Laterality: Left;   COLON SURGERY     COLONOSCOPY WITH PROPOFOL N/A 10/26/2021   Procedure: COLONOSCOPY WITH PROPOFOL;  Surgeon: Regis Bill, MD;  Location: [facility] ENDOSCOPY;  Service: Endoscopy;  Laterality: N/A;   COLONOSCOPY WITH PROPOFOL N/A 06/02/2022   Procedure: COLONOSCOPY WITH PROPOFOL;  Surgeon: Regis Bill, MD;  Location: [facility] ENDOSCOPY;  Service: Endoscopy;  Laterality: N/A;   COLONOSCOPY WITH PROPOFOL N/A 06/06/2022   Procedure: COLONOSCOPY WITH PROPOFOL;  Surgeon: Regis Bill, MD;  Location: [facility] ENDOSCOPY;  Service: Endoscopy;  Laterality: N/A;   JOINT REPLACEMENT     left partial hip    THROAT SURGERY  2013   cancer     reports that he has quit smoking. His smoking use included cigarettes. He has never used smokeless tobacco. He reports that he does not currently use alcohol. He reports that he does not use drugs.  Family History  Problem Relation Age of Onset   Cancer Mother    Hypertension Father    Leukemia Brother     Prior to Admission medications   Medication Sig Start Date End Date Taking? Authorizing Provider  aspirin 81 MG chewable tablet Take 81 mg by mouth daily.  [provider]  baclofen (LIORESAL) 5 mg TABS tablet Take 5 mg by mouth 2 (two) times daily.    [provider]  citalopram (CELEXA) 10 MG tablet Take 10 mg by mouth daily. 06/04/23   [provider]  enoxaparin (LOVENOX) 40 MG/0.4ML injection Inject 0.4 mLs (40 mg total) into the skin daily. 08/19/23   Alford Highland, MD  fluticasone (FLONASE) 50 MCG/ACT nasal spray Place 2 sprays into both nostrils daily. 05/15/23   [provider]  hydrALAZINE (APRESOLINE) 20 MG/ML injection Inject 0.25 mLs (5 mg  total) into the vein every 6 (six) hours as needed (SBP>150 or DBP>100). 08/19/23   Alford Highland, MD  lactulose (CHRONULAC) 10 GM/15ML solution Take 20 g by mouth daily. 08/15/23   [provider]  lamoTRIgine (LAMICTAL) 25 MG tablet Take 50 mg by mouth 2 (two) times daily.    [provider]  levETIRAcetam (KEPPRA) 1000 MG/100ML SOLN Inject 100 mLs (1,000 mg total) into the vein every 12 (twelve) hours. 08/19/23   Alford Highland, MD  LORazepam (ATIVAN) 2 MG/ML injection Inject 0.5 mLs (1 mg total) into the vein every 4 (four) hours as needed for seizure (May repeat dose ONCE in 5 minutes if still actively seizing.). 08/19/23   Alford Highland, MD  Melatonin 10 MG TABS Take 10 mg by mouth at bedtime.    [provider]  Mouthwashes (MOUTH RINSE) LIQD solution 15 mLs by Mouth Rinse route every 2 (two) hours. 08/19/23   Alford Highland, MD  ondansetron [facility]) 4 MG/2ML SOLN injection Inject 2 mLs (4 mg total) into the vein every 6 (six) hours as needed for nausea or vomiting. 08/19/23   Alford Highland, MD  pantoprazole (PROTONIX) 40 MG injection Inject 40 mg into the vein daily. 08/19/23   Alford Highland, MD  polyethylene glycol (MIRALAX / GLYCOLAX) 17 g packet Take 17 g by mouth 2 (two) times daily. 08/19/23   Alford Highland, MD  pregabalin (LYRICA) 50 MG capsule Take 50 mg capsule twice daily and 100 mg (two capsules) at bedtime 06/26/23   Sunnie Nielsen, DO  senna (SENOKOT) 8.6 MG TABS tablet Take 2 tablets (17.2 mg total) by mouth at bedtime as needed for mild constipation or moderate constipation. 06/26/23   Sunnie Nielsen, DO  sodium chloride 0.9 % infusion Inject 75 mLs into the vein continuous. 08/19/23   Alford Highland, MD  tamsulosin (FLOMAX) 0.4 MG CAPS capsule Take 0.4 mg by mouth every evening.    [provider]  tiotropium (SPIRIVA) 18 MCG inhalation capsule Place 18 mcg into inhaler and inhale daily.    [provider]   valproate 500 mg in dextrose 5 % 50 mL Inject 500 mg into the vein every 6 (six) hours. 08/19/23   Alford Highland, MD    Physical Exam: There were no vitals filed for this visit. Physical Exam Constitutional:      Appearance: He is normal weight.  HENT:     Head: Normocephalic.     Nose: Nose normal.     Mouth/Throat:     Mouth: Mucous membranes are moist.     Pharynx: Oropharynx is clear.  Eyes:     Conjunctiva/sclera: Conjunctivae normal.     Pupils: Pupils are equal, round, and reactive to light.  Cardiovascular:     Rate and Rhythm: Normal rate and regular rhythm.     Pulses: Normal pulses.     Heart sounds: Normal heart sounds.  Pulmonary:     Effort:  Pulmonary effort is normal.     Breath sounds: Normal breath sounds.  Abdominal:     General: Abdomen is flat. Bowel sounds are normal.  Musculoskeletal:        General: Normal range of motion.  Skin:    General: Skin is warm.     Capillary Refill: Capillary refill takes less than 2 seconds.  Neurological:     General: No focal deficit present.     Mental Status: He is alert. Mental status is at baseline.  Psychiatric:        Mood and Affect: Mood normal.        Labs on Admission: I have personally reviewed the patients's labs and imaging studies.  Assessment/Plan Principal Problem:   Seizure [facility])   # Breakthrough seizure- # Acute encephalopathy possibly due to seizure -Patient has prior history of epilepsy presented with altered mental status.  Neurology evaluated the patient and recommended EEG for further management which was not available at the outside hospital.  Patient was kept on IV Keppra and Depakote.  Neurology was consulted at [facility] and patient will be placed on continuous EEG.  Plan: EEG ordered Consult neurology PT/OT/speech evaluation As needed Ativan for seizure  # BPH-continue Flomax  # AKI on CKD stage IIIa-patient's creatinine was elevated at 1.8.  Patient was placed on IV  fluids with improvement.  Will plan to check labs tomorrow  # Hypertension-hold antihypertensives  # History of CVA-continue aspirin  # Muscle spasms-continue baclofen   Admission status: Inpatient Telemetry Medical  Certification: The appropriate patient status for this patient is INPATIENT. Inpatient status is judged to be reasonable and necessary in order to provide the required intensity of service to ensure the patient's safety. The patient's presenting symptoms, physical exam findings, and initial radiographic and laboratory data in the context of their chronic comorbidities is felt to place them at high risk for further clinical deterioration. Furthermore, it is not anticipated that the patient will be medically stable for discharge from the hospital within 2 midnights of admission.   * I certify that at the point of admission it is my clinical judgment that the patient will require inpatient hospital care spanning beyond 2 midnights from the point of admission due to high intensity of service, high risk for further deterioration and high frequency of surveillance required.Alan Mulder MD [facility] If 7PM-7AM, please contact night-coverage [email or web address]  08/19/2023, 1:14 PM

## 2023-08-19 NOTE — Progress Notes (Signed)
OT Cancellation Note  Patient Details Name: Calvin [name] MRN: [id number] DOB: 10-30-1950   Cancelled Treatment:    Reason Eval/Treat Not Completed: Medical issues which prohibited therapy;Other (comment). Consult received and chart reviewed. Patient with recent seizure-like activity this AM; pending transfer to [name] for continued medical work-up. Will hold therapy at this time and re-attempt as medically appropriate and available.   Rockney Ghee, M.S., OTR/L 08/19/23, 9:47 AM

## 2023-08-19 NOTE — Progress Notes (Signed)
SLP Cancellation Note  Patient Details Name: LANKFORD PARI MRN: [id number] DOB: [birth year]   Cancelled treatment:       Reason Eval/Treat Not Completed: Medical issues which prohibited therapy (Per chart review, concern for seizure-like activity this AM. Will defer clinical swallowing evaluation at this time.)  Clyde Canterbury, M.S., CCC-SLP Speech-Language Pathologist [facility] [phone] Arnette Felts)  Woodroe Chen 08/19/2023, 8:11 AM

## 2023-08-19 NOTE — Discharge Summary (Signed)
Physician Discharge Summary   Patient: Calvin [name] MRN: [id number] DOB: 01/22/1951  Admit date:     08/18/2023  Discharge date: 08/19/23  Discharge Physician: Alford Highland   PCP: Keane Police, MD   Recommendations at discharge:    Transfer to [facility] for continuous EEG monitoring  Discharge Diagnoses: Principal Problem:   Seizure [facility]) Active Problems:   Acute metabolic encephalopathy   Acute kidney injury superimposed on CKD [facility])   Essential hypertension   Thrombocytopenia (HCC)   History of CVA (cerebrovascular accident)  Resolved Problems:   * No resolved hospital problems. *  Hospital Course: 72 y.o. male with medical history significant of seizure disorder, CVA with residual left-sided hemiparesis, chronic HFpEF, CKD stage II, HTN, colon cancer status post transverse colectomy and colostomy, sent from nursing home for evaluation of altered mentations.   Patient is postictal and confused unable to provide any history, or history obtained by reviewing of ED record and nursing home record.  This morning around 920, patient was found confused, and started to have twitching like movement of left arm.  EMS was called and EMS arrived and found patient having left arm and hand shakiness.  EMS concerned about patient was having a seizure.  I also called patient's daughter over the phone, daughter reported that the patient was hospitalized last month for seizure and SBO, and since discharge patient has been having frequent episodes of confusion and significant deconditioning as he lost the ability to feed himself " he never recovered to where he was before admission"   ED Course: Afebrile, nontachycardic nonhypotensive nonhypoxic.  CT head showed no evidence of acute intracranial abnormalities.  UA negative for UTI.  Creatinine 1.8, BUN 33, bicarb 26, WBC 9.0, hemoglobin 12.   12/15.  Patient trying to talk but difficult to understand.  Patient was moving all of his  extremities.  Tremor with his left hand more than his right hand.  Nursing staff noticed this more when people are around.  MRI of the brain shows no acute intracranial process, unchanged right greater than left anterior frontal encephalomalacia, unchanged old infarcts in the right frontal operculum and right middle frontal gyrus, unchanged old infarcts in the right PCA territory and left cerebellar hemisphere.  EEG unavailable at [facility] over the weekend.  Assessment and Plan: * Seizure [facility]) Breakthrough seizures with history of epilepsy.  Patient hard to understand this morning but trying to talk and moving his extremities.  Neurology reevaluated and decided to transfer over to [facility] for continuous EEG monitoring.  Patient on IV Keppra and IV valproate.  Also on Lamictal.  Neurology spoke with patient's daughter and agreeable to transfer.  Acute metabolic encephalopathy Likely secondary to seizures.  Continue to monitor.  Patient will also need swallow evaluation.  Acute kidney injury superimposed on CKD (HCC) Acute kidney injury on CKD stage IIIa.  Creatinine up to 1.86.  Continue IV fluids.  Hold lisinopril.  Essential hypertension Holding antihypertensive medications.  History of CVA (cerebrovascular accident) Continue aspirin.         Consultants: [facility] Procedures performed: none  Disposition: Transfer to [place] when bed available Diet recommendation:  npo DISCHARGE MEDICATION: Allergies as of 08/19/2023   No Known Allergies      Medication List     STOP taking these medications    acetaminophen 325 MG tablet Commonly known as: TYLENOL   alum & mag hydroxide-simeth 200-200-20 MG/5ML suspension Commonly known as: MAALOX/MYLANTA   BENEFIBER DRINK MIX  PO   bisacodyl 10 MG suppository Commonly known as: DULCOLAX   bisacodyl 5 MG EC tablet Commonly known as: DULCOLAX   divalproex 125 MG capsule Commonly known as: DEPAKOTE SPRINKLE    divalproex 500 MG DR tablet Commonly known as: DEPAKOTE   levETIRAcetam 1000 MG tablet Commonly known as: KEPPRA   lisinopril 5 MG tablet Commonly known as: ZESTRIL   metoprolol succinate 25 MG 24 hr tablet Commonly known as: TOPROL-XL   nitroGLYCERIN 0.4 MG SL tablet Commonly known as: NITROSTAT   ondansetron 4 MG disintegrating tablet Commonly known as: ZOFRAN-ODT   pantoprazole 40 MG tablet Commonly known as: PROTONIX Replaced by: pantoprazole 40 MG injection   prochlorperazine 10 MG/2ML injection Commonly known as: COMPAZINE   simvastatin 20 MG tablet Commonly known as: ZOCOR   trolamine salicylate 10 % cream Commonly known as: ASPERCREME       TAKE these medications    aspirin 81 MG chewable tablet Take 81 mg by mouth daily.   baclofen 5 mg Tabs tablet Commonly known as: LIORESAL Take 5 mg by mouth 2 (two) times daily.   citalopram 10 MG tablet Commonly known as: CELEXA Take 10 mg by mouth daily.   enoxaparin 40 MG/0.4ML injection Commonly known as: LOVENOX Inject 0.4 mLs (40 mg total) into the skin daily. What changed:  medication strength how much to take   fluticasone 50 MCG/ACT nasal spray Commonly known as: FLONASE Place 2 sprays into both nostrils daily.   hydrALAZINE 20 MG/ML injection Commonly known as: APRESOLINE Inject 0.25 mLs (5 mg total) into the vein every 6 (six) hours as needed (SBP>150 or DBP>100).   lactulose 10 GM/15ML solution Commonly known as: CHRONULAC Take 20 g by mouth daily.   lamoTRIgine 25 MG tablet Commonly known as: LAMICTAL Take 50 mg by mouth 2 (two) times daily.   levETIRAcetam 1000 MG/100ML Soln Commonly known as: KEPPRA Inject 100 mLs (1,000 mg total) into the vein every 12 (twelve) hours.   LORazepam 2 MG/ML injection Commonly known as: ATIVAN Inject 0.5 mLs (1 mg total) into the vein every 4 (four) hours as needed for seizure (May repeat dose ONCE in 5 minutes if still actively seizing.).    Melatonin 10 MG Tabs Take 10 mg by mouth at bedtime.   mouth rinse Liqd solution 15 mLs by Mouth Rinse route every 2 (two) hours.   ondansetron 4 MG/2ML Soln injection Commonly known as: ZOFRAN Inject 2 mLs (4 mg total) into the vein every 6 (six) hours as needed for nausea or vomiting.   pantoprazole 40 MG injection Commonly known as: PROTONIX Inject 40 mg into the vein daily. Replaces: pantoprazole 40 MG tablet   polyethylene glycol 17 g packet Commonly known as: MIRALAX / GLYCOLAX Take 17 g by mouth 2 (two) times daily. What changed:  how much to take when to take this   pregabalin 50 MG capsule Commonly known as: LYRICA Take 50 mg capsule twice daily and 100 mg (two capsules) at bedtime   senna 8.6 MG Tabs tablet Commonly known as: SENOKOT Take 2 tablets (17.2 mg total) by mouth at bedtime as needed for mild constipation or moderate constipation.   sodium chloride 0.9 % infusion Inject 75 mLs into the vein continuous.   tamsulosin 0.4 MG Caps capsule Commonly known as: FLOMAX Take 0.4 mg by mouth every evening.   tiotropium 18 MCG inhalation capsule Commonly known as: SPIRIVA Place 18 mcg into inhaler and inhale daily.   valproate 500 mg  in dextrose 5 % 50 mL Inject 500 mg into the vein every 6 (six) hours.        Discharge Exam: Filed Weights   08/18/23 1116  Weight: 90.7 kg   Physical Exam HENT:     Head: Normocephalic.  Eyes:     General: Lids are normal.     Conjunctiva/sclera: Conjunctivae normal.  Cardiovascular:     Rate and Rhythm: Normal rate and regular rhythm.     Heart sounds: Normal heart sounds, S1 normal and S2 normal.  Pulmonary:     Breath sounds: No decreased breath sounds, wheezing, rhonchi or rales.  Abdominal:     Palpations: Abdomen is soft.     Tenderness: There is no abdominal tenderness.  Musculoskeletal:     Right lower leg: No swelling.     Left lower leg: No swelling.  Skin:    General: Skin is warm.      Findings: No rash.  Neurological:     Mental Status: He is lethargic.     Comments: Patient able to talk but difficult to understand. Tremor left arm greater than right.  Able to follow some simple commands with lifting arms up off the bed and legs up off the bed.      Condition at discharge: fair  The results of significant diagnostics from this hospitalization (including imaging, microbiology, ancillary and laboratory) are listed below for reference.   Imaging Studies: MR BRAIN WO CONTRAST Result Date: 08/18/2023 CLINICAL DATA:  Neuro deficit, acute, stroke suspected. Altered mental status. EXAM: MRI HEAD WITHOUT CONTRAST TECHNIQUE: Multiplanar, multiecho pulse sequences of the brain and surrounding structures were obtained without intravenous contrast. COMPARISON:  MRI brain 07/18/2023.  Head CT 08/18/2023. FINDINGS: Brain: No acute infarct or hemorrhage. Unchanged right-greater-than-left anterior frontal encephalomalacia, likely sequela of prior trauma. Unchanged encephalomalacia along the right frontal operculum and right middle frontal gyrus, likely sequela of prior infarct. Unchanged old infarcts in the right PCA territory and left cerebellar hemisphere. No hydrocephalus or extra-axial collection. No mass or midline shift. Vascular: Normal flow voids. Skull and upper cervical spine: Normal marrow signal. Sinuses/Orbits: Negative. Other: None. IMPRESSION: 1. No acute intracranial process. 2. Unchanged right-greater-than-left anterior frontal encephalomalacia, likely sequela of prior trauma. 3. Unchanged old infarcts in the right frontal operculum and right middle frontal gyrus. 4. Unchanged old infarcts in the right PCA territory and left cerebellar hemisphere. Electronically Signed   By: Orvan Falconer M.D.   On: 08/18/2023 19:38   DG Chest 1 View Result Date: 08/18/2023 CLINICAL DATA:  Left arm shaking unresponsive EXAM: CHEST  1 VIEW COMPARISON:  07/28/2023 FINDINGS: Mild cardiomegaly. No  acute airspace disease or pleural effusion. No pneumothorax. Suspected AVN type changes of left humeral head. IMPRESSION: No active disease. Mild cardiomegaly. Electronically Signed   By: Jasmine Pang M.D.   On: 08/18/2023 15:54   CT Head Wo Contrast Result Date: 08/18/2023 CLINICAL DATA:  Mental status change, unknown cause EXAM: CT HEAD WITHOUT CONTRAST TECHNIQUE: Contiguous axial images were obtained from the base of the skull through the vertex without intravenous contrast. RADIATION DOSE REDUCTION: This exam was performed according to the departmental dose-optimization program which includes automated exposure control, adjustment of the mA and/or kV according to patient size and/or use of iterative reconstruction technique. COMPARISON:  CT head July 17, 2023. MRI head July 18, 2023 FINDINGS: Brain: Similar multifocal encephalomalacia, including in the inferior right greater than left frontal lobes, more superior right frontal lobe, and right parieto-occipital region.  Small remote left cerebellar infarct. No evidence of acute large vascular territory infarct, acute hemorrhage, mass lesion, midline shift or hydrocephalus. Vascular: Calcific atherosclerosis. No hyperdense vessel identified. Skull: No acute fracture. Sinuses/Orbits: Mostly clear sinuses.  No acute orbital findings. Other: Mastoid effusions. IMPRESSION: 1. No evidence of acute intracranial abnormality. 2. Similar multifocal encephalomalacia, detailed above. Electronically Signed   By: Feliberto Harts M.D.   On: 08/18/2023 12:22   DG Abd 2 Views Result Date: 07/28/2023 CLINICAL DATA:  Vomiting EXAM: ABDOMEN - 2 VIEW COMPARISON:  07/19/2023, CT 07/15/2023 FINDINGS: Nonobstructive bowel gas pattern. No gross free intraperitoneal gas. Residual barium material within a decompressed distal colon. Dense calcification within the mid pelvis corresponds to calcific debris within the sigmoid colon. No nephro or urolithiasis. Vascular  calcifications are seen. 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 No acute bone abnormality. IMPRESSION: 1. Nonobstructive bowel gas pattern. Electronically Signed   By: Helyn Numbers M.D.   On: 07/28/2023 20:24   DG Chest Portable 1 View Result Date: 07/28/2023 CLINICAL DATA:  Globus sensation. EXAM: PORTABLE CHEST 1 VIEW COMPARISON:  Chest radiograph dated 07/16/2023. FINDINGS: The heart size and mediastinal contours are within normal limits. Mild bilateral interstitial opacities. Mild left basilar atelectasis/airspace disease. No pleural effusion or pneumothorax. There is flattening of the left humeral head which may reflect avascular necrosis. IMPRESSION: 1. Mild bilateral interstitial opacities may reflect pulmonary edema or scarring. 2. Mild left basilar atelectasis/airspace disease. 3. Flattening of the left humeral head may reflect avascular necrosis and fracture. Electronically Signed   By: Romona Curls M.D.   On: 07/28/2023 16:42    Microbiology: Results for orders placed or performed during the hospital encounter of 08/18/23  Resp panel by RT-PCR (RSV, Flu A&B, Covid) Anterior Nasal Swab     Status: None   Collection Time: 08/18/23  1:15 PM   Specimen: Anterior Nasal Swab  Result Value Ref Range Status   SARS Coronavirus 2 by RT PCR NEGATIVE NEGATIVE Final    Comment: (NOTE) SARS-CoV-2 target nucleic acids are NOT DETECTED.  The SARS-CoV-2 RNA is generally detectable in upper respiratory specimens during the acute phase of infection. The lowest concentration of SARS-CoV-2 viral copies this assay can detect is 138 copies/mL. A negative result does not preclude SARS-Cov-2 infection and should not be used as the sole basis for treatment or other patient management decisions. A negative result may occur with  improper specimen collection/handling, submission of specimen  other than nasopharyngeal swab, presence of viral mutation(s) within the areas targeted by this assay, and inadequate number of viral copies(<138 copies/mL). A negative result must be combined with clinical observations, patient history, and epidemiological information. The expected result is Negative.  Fact Sheet for Patients:  BloggerCourse.com  Fact Sheet for Healthcare Providers:  SeriousBroker.it  This test is no t yet approved or cleared by the [place] FDA and  has been authorized for detection and/or diagnosis of SARS-CoV-2 by FDA under an Emergency Use Authorization (EUA). This EUA will remain  in effect (meaning this test can be used) for the duration of the COVID-19 declaration under Section 564(b)(1) of the Act, 21 U.S.C.section 360bbb-3(b)(1), unless the authorization is terminated  or revoked sooner.       Influenza A by PCR NEGATIVE NEGATIVE Final   Influenza B by PCR NEGATIVE NEGATIVE Final    Comment: (NOTE) The Xpert Xpress SARS-CoV-2/FLU/RSV plus assay is intended as an aid  in the diagnosis of influenza from Nasopharyngeal swab specimens and should not be used as a sole basis for treatment. Nasal washings and aspirates are unacceptable for Xpert Xpress SARS-CoV-2/FLU/RSV testing.  Fact Sheet for Patients: BloggerCourse.com  Fact Sheet for Healthcare Providers: SeriousBroker.it  This test is not yet approved or cleared by the [place] FDA and has been authorized for detection and/or diagnosis of SARS-CoV-2 by FDA under an Emergency Use Authorization (EUA). This EUA will remain in effect (meaning this test can be used) for the duration of the COVID-19 declaration under Section 564(b)(1) of the Act, 21 U.S.C. section 360bbb-3(b)(1), unless the authorization is terminated or revoked.     Resp Syncytial Virus by PCR NEGATIVE NEGATIVE Final     Comment: (NOTE) Fact Sheet for Patients: BloggerCourse.com  Fact Sheet for Healthcare Providers: SeriousBroker.it  This test is not yet approved or cleared by the [place] FDA and has been authorized for detection and/or diagnosis of SARS-CoV-2 by FDA under an Emergency Use Authorization (EUA). This EUA will remain in effect (meaning this test can be used) for the duration of the COVID-19 declaration under Section 564(b)(1) of the Act, 21 U.S.C. section 360bbb-3(b)(1), unless the authorization is terminated or revoked.  Performed at [facility], [address] [place], [place] [address]     Labs: CBC: Recent Labs  Lab 08/18/23 1115  WBC 9.0  NEUTROABS 6.8  HGB 12.1*  HCT 40.5  MCV 97.8  PLT 117*   Basic Metabolic Panel: Recent Labs  Lab 08/18/23 1115  NA 141  K 5.1  CL 106  CO2 26  GLUCOSE 97  BUN 33*  CREATININE 1.86*  CALCIUM 9.1   Liver Function Tests: Recent Labs  Lab 08/18/23 1115  AST 13*  ALT 12  ALKPHOS 40  BILITOT <0.2  PROT 7.3  ALBUMIN 3.5   CBG: No results for input(s): "GLUCAP" in the last 168 hours.  Discharge time spent: greater than 30 minutes.  Signed: Alford Highland, MD [facility] 08/19/2023

## 2023-08-19 NOTE — ED Notes (Signed)
Upon assessment pt stated "mr Gains" when this nurse asked what his name was. Pt followed commands when taking oral temperature but went into a dazed affect when asked if he knew where he was. Pt started to have small tremors for a few seconds and said "oh god" once he stopped tremoring. Pt pointed to bathroom and started mumbling. Speech is incomprehensible but pt stated " light". This nurse turned off bathroom light and pt nodded. Pt speaks but speech is incomprehensible. NAD, vitals WDL. Bed locked and in lowest position.

## 2023-08-19 NOTE — Progress Notes (Signed)
LTM EEG hooked up and running - no initial skin breakdown - push button tested - [facility] monitoring.  

## 2023-08-19 NOTE — Progress Notes (Signed)
NEUROLOGY CONSULT FOLLOW UP NOTE   Date of service: August 19, 2023 [name] MRN:  [id number] DOB:  01-10-1951   Interval Hx/subjective  Patient seen and examined Seems a little more awake today.  I was able to speak with his daughter Ms. Georgina Quint over the phone.  She reports that after the discharge in November, he never returned back to his baseline prior to that 100%. He was still able to feed himself at the facility but on Saturday was noted that he was acting more confused, unable to feed himself and the nursing care of him at that facility where he has lived for 7 years felt that he was just not normal.  Daughter is also concerned that he probably has been getting too much pain medication at the facility where he is due to a lot of turnover and staff and also questions if his seizure meds are being given correctly although she has not been told if there have been any missed dosages etc.   Vitals   Vitals:   08/19/23 0500 08/19/23 0600 08/19/23 0718 08/19/23 0720  BP: (!) 120/106 120/80 138/80   Pulse:   (!) 104   Resp: (!) 22 (!) 24 (!) 26   Temp:   97.8 F (36.6 C)   TempSrc:   Oral   SpO2: 98% 100% 99% 99%  Weight:      Height:         Body mass index is 27.89 kg/m.  Physical Exam  General: Well-developed well-nourished does not appear to be in acute distress HEENT: Normocephalic, atraumatic, dried oral mucous membranes with some dried food on his mouth CVS: Regular rhythm Neurological exam Comfortably sleeping in bed, opens eyes to voice, very difficult to comprehend speech due to extreme dysarthria.  Does not seem to have gross aphasia but has poor attention concentration. Does not follow commands very consistently but is able to mimic most of the commands and follow some verbal commands as well.  Also perseverates. Cranial nerves: Pupils are equal round reactive light, extraocular movements appear unhindered-gaze is midline, blinks to  threat from both sides, face appears grossly symmetric. Motor examination reveals increased tone in left upper and lower extremity with antigravity strength in the left upper extremity today.  As soon as he starts going antigravity he starts having this prominent large amplitude tremor in the left arm.  There is some antigravity strength in the left leg today which is also different than yesterday.  Right side is much stronger with at least 4/5 strength in the upper and lower extremity. Sensation appears intact Coordination difficult to assess given his mentation   Medications  Current Facility-Administered Medications:    0.9 %  sodium chloride infusion, 75 mL/hr, Intravenous, Continuous, Mikey College T, MD, Last Rate: 75 mL/hr at 08/19/23 0558, 75 mL/hr at 08/19/23 0558   baclofen (LIORESAL) tablet 5 mg, 5 mg, Oral, BID, Mikey College T, MD   citalopram (CELEXA) tablet 10 mg, 10 mg, Oral, Daily, Zhang, Ping T, MD   enoxaparin (LOVENOX) injection 40 mg, 40 mg, Subcutaneous, Q24H, Chipper Herb, Ping T, MD, 40 mg at 08/18/23 2144   fluticasone (FLONASE) 50 MCG/ACT nasal spray 2 spray, 2 spray, Each Nare, Daily, Mikey College T, MD   hydrALAZINE (APRESOLINE) injection 5 mg, 5 mg, Intravenous, Q6H PRN, Mikey College T, MD, 5 mg at 08/19/23 0114   lamoTRIgine (LAMICTAL) tablet 50 mg, 50 mg, Oral, BID, Emeline General, MD   levETIRAcetam (  KEPPRA) IVPB 1000 mg/100 mL premix, 1,000 mg, Intravenous, Q12H, Mikey College T, MD, Stopped at 08/18/23 2134   LORazepam (ATIVAN) injection 4 mg, 4 mg, Intravenous, Q5 Min x 2 PRN, Mikey College T, MD   melatonin tablet 10 mg, 10 mg, Oral, QHS, Zhang, Ping T, MD   ondansetron [facility]) tablet 4 mg, 4 mg, Oral, Q6H PRN **OR** ondansetron (ZOFRAN) injection 4 mg, 4 mg, Intravenous, Q6H PRN, Emeline General, MD   Oral care mouth rinse, 15 mL, Mouth Rinse, Q2H, Mikey College T, MD, 15 mL at 08/18/23 2136   Oral care mouth rinse, 15 mL, Mouth Rinse, PRN, Mikey College T, MD   pantoprazole  (PROTONIX) injection 40 mg, 40 mg, Intravenous, Q24H, Zhang, Ping T, MD, 40 mg at 08/18/23 1501   pregabalin (LYRICA) capsule 100 mg, 100 mg, Oral, QHS, Zhang, Ilda Foil T, MD   tamsulosin (FLOMAX) capsule 0.4 mg, 0.4 mg, Oral, QPM, Mikey College T, MD   tiotropium [facility]) inhalation capsule ([facility] use ONLY) 18 mcg, 18 mcg, Inhalation, Daily, Mikey College T, MD   valproate (DEPACON) 500 mg in dextrose 5 % 50 mL IVPB, 500 mg, Intravenous, Q6H, Emeline General, MD, Stopped at 08/19/23 0720  Current Outpatient Medications:    acetaminophen (TYLENOL) 325 MG tablet, Take 650 mg by mouth every 6 (six) hours., Disp: , Rfl:    alum & mag hydroxide-simeth (MAALOX/MYLANTA) 200-200-20 MG/5ML suspension, Take 30 mLs by mouth every 12 (twelve) hours as needed for indigestion or heartburn., Disp: , Rfl:    aspirin 81 MG chewable tablet, Take 81 mg by mouth daily., Disp: , Rfl:    baclofen (LIORESAL) 5 mg TABS tablet, Take 5 mg by mouth 2 (two) times daily., Disp: , Rfl:    bisacodyl (DULCOLAX) 10 MG suppository, Place 1 suppository (10 mg total) rectally daily as needed for moderate constipation or mild constipation., Disp: , Rfl:    bisacodyl (DULCOLAX) 5 MG EC tablet, Take 5 mg by mouth daily., Disp: , Rfl:    citalopram (CELEXA) 10 MG tablet, Take 10 mg by mouth daily., Disp: , Rfl:    divalproex (DEPAKOTE SPRINKLE) 125 MG capsule, Take 1,000 mg by mouth 2 (two) times daily., Disp: , Rfl:    fluticasone (FLONASE) 50 MCG/ACT nasal spray, Place 2 sprays into both nostrils daily., Disp: , Rfl:    lactulose (CHRONULAC) 10 GM/15ML solution, Take 20 g by mouth daily., Disp: , Rfl:    lamoTRIgine (LAMICTAL) 25 MG tablet, Take 50 mg by mouth 2 (two) times daily., Disp: , Rfl:    levETIRAcetam (KEPPRA) 1000 MG tablet, Take 1 tablet (1,000 mg total) by mouth 2 (two) times daily., Disp: , Rfl:    lisinopril (ZESTRIL) 5 MG tablet, Take 1 tablet (5 mg total) by mouth daily. (Patient taking differently: Take 5 mg by mouth daily.  Hold for systolic BP< 110.), Disp: 30 tablet, Rfl: 0   Melatonin 10 MG TABS, Take 10 mg by mouth at bedtime., Disp: , Rfl:    metoprolol succinate (TOPROL-XL) 25 MG 24 hr tablet, Take 25 mg by mouth daily., Disp: , Rfl:    nitroGLYCERIN (NITROSTAT) 0.4 MG SL tablet, Place 0.4 mg under the tongue every 5 (five) minutes as needed for chest pain., Disp: , Rfl:    ondansetron (ZOFRAN-ODT) 4 MG disintegrating tablet, Take 4 mg by mouth every 8 (eight) hours as needed for nausea or vomiting., Disp: , Rfl:    pantoprazole (PROTONIX) 40 MG tablet, Take 40 mg by  mouth in the morning., Disp: , Rfl:    polyethylene glycol (MIRALAX / GLYCOLAX) 17 g packet, Take 34 g by mouth daily., Disp: , Rfl:    pregabalin (LYRICA) 50 MG capsule, Take 50 mg capsule twice daily and 100 mg (two capsules) at bedtime, Disp: 30 capsule, Rfl: 0   senna (SENOKOT) 8.6 MG TABS tablet, Take 2 tablets (17.2 mg total) by mouth at bedtime as needed for mild constipation or moderate constipation., Disp: , Rfl:    simvastatin (ZOCOR) 20 MG tablet, Take 20 mg by mouth at bedtime., Disp: , Rfl:    tamsulosin (FLOMAX) 0.4 MG CAPS capsule, Take 0.4 mg by mouth every evening., Disp: , Rfl:    tiotropium (SPIRIVA) 18 MCG inhalation capsule, Place 18 mcg into inhaler and inhale daily., Disp: , Rfl:    Wheat Dextrin (BENEFIBER DRINK MIX PO), Take 5 g by mouth daily., Disp: , Rfl:    divalproex (DEPAKOTE) 500 MG DR tablet, Take 2 tablets (1,000 mg total) by mouth 2 (two) times daily. (Patient not taking: Reported on 08/18/2023), Disp: , Rfl:    enoxaparin (LOVENOX) 60 MG/0.6ML injection, Inject 0.45 mLs (45 mg total) into the skin daily. (Patient not taking: Reported on 08/18/2023), Disp: , Rfl:    prochlorperazine (COMPAZINE) 10 MG/2ML injection, Inject 2 mLs (10 mg total) into the vein every 6 (six) hours as needed., Disp: , Rfl:    trolamine salicylate (ASPERCREME) 10 % cream, Apply topically 2 (two) times daily as needed for muscle pain., Disp:  , Rfl:  Labs and Diagnostic Imaging   CBC:  Recent Labs  Lab 08/18/23 1115  WBC 9.0  NEUTROABS 6.8  HGB 12.1*  HCT 40.5  MCV 97.8  PLT 117*    Basic Metabolic Panel:  Lab Results  Component Value Date   NA 141 08/18/2023   K 5.1 08/18/2023   CO2 26 08/18/2023   GLUCOSE 97 08/18/2023   BUN 33 (H) 08/18/2023   CREATININE 1.86 (H) 08/18/2023   CALCIUM 9.1 08/18/2023   GFRNONAA 38 (L) 08/18/2023   GFRAA 58 (L) 10/12/2019   Lipid Panel:  Lab Results  Component Value Date   LDLCALC 57 05/29/2022   HgbA1c:  Lab Results  Component Value Date   HGBA1C 5.8 (H) 05/28/2022   Urine Drug Screen:     Component Value Date/Time   LABOPIA NEGATIVE 08/14/2014 0933   COCAINSCRNUR NEGATIVE 08/14/2014 0933   LABBENZ NEGATIVE 08/14/2014 0933   AMPHETMU NEGATIVE 08/14/2014 0933   THCU NEGATIVE 08/14/2014 0933   LABBARB NEGATIVE 08/14/2014 0933    Alcohol Level No results found for: "ETH" INR  Lab Results  Component Value Date   INR 1.2 06/01/2022   APTT  Lab Results  Component Value Date   APTT 35 06/01/2022   AED levels:  Lab Results  Component Value Date   PHENYTOIN 20.8 (H) 06/28/2014   LAMOTRIGINE 8.3 07/30/2023   LEVETIRACETA 32.4 05/28/2022  Valproate level pending   MRI Brain(Personally reviewed): No new stroke.  Multiple areas of encephalomalacia with most prominent right MCA territory infarct with encephalomalacia from prior strokes.    Assessment   HARTWELL BURKEEN is a 72 y.o. 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 At baseline he is able to feed himself but over the past few days he has  not been able to do that.  His speech has also been more dysarthric. He has more of a stimulus induced tremor on the left side but did have an episode of left-sided shaking that resolved and then he was lethargic making me concerned that he is having intermittent focal seizures and there may be some generalization when he is not being watched. I discussed these all findings in detail with the daughter and offered that we could watch him on LTM to make sure that this is not because of seizures.  She is amenable and I would recommend transfer to [facility] for LTM EEG  Impression: Breakthrough seizure in a patient with known seizures from poststroke epilepsy Prior strokes with residual left-sided hemiparesis Baseline tremors No evidence of new stroke on MRI   Recommendations  Depakote level has been ordered since yesterday-I have asked the nurse at bedside to draw the level again. For now continue home Keppra and Depakote lamotrigine and pregabalin.  I would start tapering off his Depakote and increasing lamotrigine once he is hooked up to LTM to avoid the interaction between the 2 medicines. Ativan for any seizure lasting more than 5 minutes. Transfer to [facility] for LTM EEG I will speak with the daughter if she arrives in time and discussed the imaging findings as well as my plan in more detail-have already had the preliminary discussion in detail over the phone with her. Plan was discussed with patient's bedside RN as well as Dr. Renae Gloss via secure chat. I have notified my team at [facility] will be hooked up to LTM EEG upon arrival.  If he remains at [facility], he will be followed by neurology here, otherwise he will be followed by neurology at [facility] tomorrow. ______________________________________________________________________   Dene Gentry, MD [facility]

## 2023-08-19 NOTE — ED Notes (Signed)
This nurse called lab to tube Spiriva inhaler

## 2023-08-19 NOTE — ED Notes (Signed)
Brother at bedside 

## 2023-08-19 NOTE — Progress Notes (Signed)
PT Cancellation Note  Patient Details Name: Calvin [name] MRN: [id number] DOB: December 04, 1950   Cancelled Treatment:    Reason Eval/Treat Not Completed: Medical issues which prohibited therapy (Consult received and chart reviewed.  Patient with recent seizure-like activity this AM; pending transfer to [facility] for continued medical work-up.  Will hold therapy at this time and re-attempt as medically appropriate and available.)   Ashaun Gaughan H. Manson Passey, PT, DPT, NCS 08/19/23, 9:42 AM [phone]

## 2023-08-19 NOTE — ED Notes (Addendum)
This nurse called in attempt to give report to [place], RN @ [facility]

## 2023-08-19 NOTE — Hospital Course (Signed)
72 y.o. male with medical history significant of seizure disorder, CVA with residual left-sided hemiparesis, chronic HFpEF, CKD stage II, HTN, colon cancer status post transverse colectomy and colostomy, sent from nursing home for evaluation of altered mentations.   Patient is postictal and confused unable to provide any history, or history obtained by reviewing of ED record and nursing home record.  This morning around 920, patient was found confused, and started to have twitching like movement of left arm.  EMS was called and EMS arrived and found patient having left arm and hand shakiness.  EMS concerned about patient was having a seizure.  I also called patient's daughter over the phone, daughter reported that the patient was hospitalized last month for seizure and SBO, and since discharge patient has been having frequent episodes of confusion and significant deconditioning as he lost the ability to feed himself " he never recovered to where he was before admission"   ED Course: Afebrile, nontachycardic nonhypotensive nonhypoxic.  CT head showed no evidence of acute intracranial abnormalities.  UA negative for UTI.  Creatinine 1.8, BUN 33, bicarb 26, WBC 9.0, hemoglobin 12.   12/15.  Patient trying to talk but difficult to understand.  Patient was moving all of his extremities.  Tremor with his left hand more than his right hand.  Nursing staff noticed this more when people are around.  MRI of the brain shows no acute intracranial process, unchanged right greater than left anterior frontal encephalomalacia, unchanged old infarcts in the right frontal operculum and right middle frontal gyrus, unchanged old infarcts in the right PCA territory and left cerebellar hemisphere.  EEG unavailable at [facility] over the weekend.

## 2023-08-19 NOTE — ED Notes (Signed)
Hospitalist at bedside 

## 2023-08-19 NOTE — Assessment & Plan Note (Addendum)
Likely secondary to seizures.  Continue to monitor.  Patient will also need swallow evaluation.

## 2023-08-19 NOTE — Assessment & Plan Note (Signed)
Breakthrough seizures with history of epilepsy.  Patient hard to understand this morning but trying to talk and moving his extremities.  Neurology reevaluated and decided to transfer over to [facility] for continuous EEG monitoring.  Patient on IV Keppra and IV valproate.  Also on Lamictal.  Neurology spoke with patient's daughter and agreeable to transfer.

## 2023-08-20 DIAGNOSIS — R569 Unspecified convulsions: Secondary | ICD-10-CM | POA: Diagnosis not present

## 2023-08-20 LAB — BASIC METABOLIC PANEL
Anion gap: 12 (ref 5–15)
BUN: 14 mg/dL (ref 8–23)
CO2: 20 mmol/L — ABNORMAL LOW (ref 22–32)
Calcium: 9.1 mg/dL (ref 8.9–10.3)
Chloride: 109 mmol/L (ref 98–111)
Creatinine, Ser: 1.51 mg/dL — ABNORMAL HIGH (ref 0.61–1.24)
GFR, Estimated: 48 mL/min — ABNORMAL LOW (ref >60)
Glucose, Bld: 72 mg/dL (ref 70–99)
Potassium: 5.1 mmol/L (ref 3.5–5.1)
Sodium: 141 mmol/L (ref 135–145)

## 2023-08-20 LAB — PHOSPHORUS: Phosphorus: 2.7 mg/dL (ref 2.5–4.6)

## 2023-08-20 LAB — CBC
HCT: 39.6 % (ref 39.0–52.0)
Hemoglobin: 12.3 g/dL — ABNORMAL LOW (ref 13.0–17.0)
MCH: 29.5 pg (ref 26.0–34.0)
MCHC: 31.1 g/dL (ref 30.0–36.0)
MCV: 95 fL (ref 80.0–100.0)
Platelets: 99 10*3/uL — ABNORMAL LOW (ref 150–400)
RBC: 4.17 MIL/uL — ABNORMAL LOW (ref 4.22–5.81)
RDW: 14 % (ref 11.5–15.5)
WBC: 6.5 10*3/uL (ref 4.0–10.5)
nRBC: 0 % (ref 0.0–0.2)

## 2023-08-20 LAB — AMMONIA: Ammonia: 26 umol/L (ref 9–35)

## 2023-08-20 LAB — VALPROIC ACID LEVEL: Valproic Acid Lvl: 111 ug/mL — ABNORMAL HIGH (ref 50.0–100.0)

## 2023-08-20 LAB — MAGNESIUM: Magnesium: 1.9 mg/dL (ref 1.7–2.4)

## 2023-08-20 MED ORDER — SODIUM CHLORIDE 0.9 % IV SOLN
INTRAVENOUS | Status: AC
Start: 2023-08-20 — End: 2023-08-21

## 2023-08-20 MED ORDER — DEXTROSE 5 % IV SOLN
250.0000 mg | Freq: Four times a day (QID) | INTRAVENOUS | Status: DC
  Administered 2023-08-20 – 2023-08-21 (×3): 250 mg via INTRAVENOUS
  Filled 2023-08-20 (×4): qty 2.5

## 2023-08-20 MED ORDER — ASPIRIN 81 MG PO CHEW
81.0000 mg | CHEWABLE_TABLET | Freq: Every day | ORAL | Status: DC
  Administered 2023-08-20 – 2023-08-23 (×4): 81 mg via ORAL
  Filled 2023-08-20 (×4): qty 1

## 2023-08-20 NOTE — Evaluation (Signed)
Physical Therapy Evaluation Patient Details Name: Calvin [name] MRN: [id number] DOB: [birth year] Today's Date: 08/20/2023  History of Present Illness  72 year old male presented to the emergency department 08/19/23 from a nursing home with altered mental status.  Patient was confused and unable to provide history at [facility].  At that time family reported patient's had frequent episodes of confusion and worsening failure to thrive.  He was afebrile and hemodynamically stable.  CT head was obtained which showed no acute intracranial abnormalities.  Laboratory workup revealed creatinine 1.8, WBC 9.0, hemoglobin 12, urinalysis negative for UTI MR head was obtained which showed no acute processes with unchanged old infarcts.  Neurology was consulted and recommended transfer to [facility] for EEG.  Clinical Impression  Patient received in bed, he has continuous EEG running. Patient is difficult to understand. He is found to be wet in bed due to malfunction of male pure wick. Gown and bed pad changed. Patient required max/total A +2 for rolling to his left and right for changing bed pad. Patient has not been ambulatory in a while and per patient the staff use lift to get him up when out of bed. Patient has no skilled needs at this time. Signing off.         If plan is discharge home, recommend the following: Two people to help with walking and/or transfers;A lot of help with bathing/dressing/bathroom   Can travel by private vehicle   No    Equipment Recommendations None recommended by PT  Recommendations for Other Services       Functional Status Assessment Patient has not had a recent decline in their functional status     Precautions / Restrictions Precautions Precautions: Fall Restrictions Weight Bearing Restrictions Per Provider Order: No      Mobility  Bed Mobility Overal bed mobility: Needs Assistance Bed Mobility: Rolling Rolling: +2 for physical assistance,  Max assist         General bed mobility comments: patient requires +2 max to total assist for rolling to right and left in bed.    Transfers                   General transfer comment: Did not attempt    Ambulation/Gait                  Stairs            Wheelchair Mobility     Tilt Bed    Modified Rankin (Stroke Patients Only)       Balance                                             Pertinent Vitals/Pain Pain Assessment Pain Assessment: PAINAD Breathing: normal Negative Vocalization: occasional moan/groan, low speech, negative/disapproving quality Facial Expression: facial grimacing Body Language: relaxed Consolability: no need to console PAINAD Score: 3 Pain Intervention(s): Monitored during session, Repositioned    Home Living Family/patient expects to be discharged to:: Skilled nursing facility                   Additional Comments: Family reports patient required assist at SNF for mobility and has not been able to stand for months.    Prior Function Prior Level of Function : Needs assist  Cognitive Assist : Mobility (cognitive) Mobility (Cognitive): Intermittent cues   Physical  Assist : Mobility (physical) Mobility (physical): Bed mobility;Transfers   Mobility Comments: per chart review from 1 month ago- uses W/C most of the time per report and reports being able to SPT from bed<>W/C ADLs Comments: per chart review from 1 month ago-says he has assistance with a shower 2x/wk and otherwise they bathe him in bed     Extremity/Trunk Assessment   Upper Extremity Assessment Upper Extremity Assessment: Defer to OT evaluation    Lower Extremity Assessment Lower Extremity Assessment: Generalized weakness       Communication   Communication Communication: Difficulty communicating thoughts/reduced clarity of speech Cueing Techniques: Verbal cues;Gestural cues;Tactile cues  Cognition Arousal:  Alert Behavior During Therapy: WFL for tasks assessed/performed Overall Cognitive Status: Impaired/Different from baseline Area of Impairment: Orientation, Memory, Awareness, Problem solving                 Orientation Level: Disoriented to, Time, Situation, Place   Memory: Decreased short-term memory     Awareness: Intellectual Problem Solving: Slow processing, Requires verbal cues, Requires tactile cues          General Comments      Exercises     Assessment/Plan    PT Assessment Patient does not need any further PT services  PT Problem List         PT Treatment Interventions      PT Goals (Current goals can be found in the Care Plan section)  Acute Rehab PT Goals Patient Stated Goal: to return to NH per family PT Goal Formulation: With family Time For Goal Achievement: 09/03/23 Potential to Achieve Goals: Good    Frequency       Co-evaluation PT/OT/SLP Co-Evaluation/Treatment: Yes Reason for Co-Treatment: For patient/therapist safety;Necessary to address cognition/behavior during functional activity;To address functional/ADL transfers PT goals addressed during session: Mobility/safety with mobility         AM-PAC PT "6 Clicks" Mobility  Outcome Measure Help needed turning from your back to your side while in a flat bed without using bedrails?: Total Help needed moving from lying on your back to sitting on the side of a flat bed without using bedrails?: Total Help needed moving to and from a bed to a chair (including a wheelchair)?: Total Help needed standing up from a chair using your arms (e.g., wheelchair or bedside chair)?: Total Help needed to walk in hospital room?: Total Help needed climbing 3-5 steps with a railing? : Total 6 Click Score: 6    End of Session   Activity Tolerance: Patient limited by fatigue Patient left: in bed;with call bell/phone within reach;with family/visitor present;with bed alarm set Nurse Communication: Mobility  status      Time: 4332-9518 PT Time Calculation (min) (ACUTE ONLY): 26 min   Charges:   PT Evaluation $PT Eval Low Complexity: 1 Low   PT General Charges $$ ACUTE PT VISIT: 1 Visit         Leeandra Ellerson, PT, GCS 08/20/23,12:46 PM

## 2023-08-20 NOTE — TOC Initial Note (Signed)
Transition of Care [facility]) - Initial/Assessment Note    Patient Details  Name: Calvin [name] MRN: [id number] Date of Birth: Jan 29, 1951  Transition of Care [facility]) CM/SW Contact:    Mearl Latin, LCSW Phone Number: 08/20/2023, 12:34 PM  Clinical Narrative:                 Patient admitted from [facility] long term care SNF. CSW will continue to follow.   Expected Discharge Plan: Skilled Nursing Facility Barriers to Discharge: Continued Medical Work up   Patient Goals and CMS Choice Patient states their goals for this hospitalization and ongoing recovery are:: Return to SNF          Expected Discharge Plan and Services In-house Referral: Clinical Social Work   Post Acute Care Choice: Skilled Nursing Facility Living arrangements for the past 2 months: Skilled Nursing Facility                                      Prior Living Arrangements/Services Living arrangements for the past 2 months: Skilled Nursing Facility Lives with:: Facility Resident Patient language and need for interpreter reviewed:: Yes Do you feel safe going back to the place where you live?: Yes      Need for Family Participation in Patient Care: Yes (Comment) Care giver support system in place?: Yes (comment)   Criminal Activity/Legal Involvement Pertinent to Current Situation/Hospitalization: No - Comment as needed  Activities of Daily Living      Permission Sought/Granted Permission sought to share information with : Facility Medical sales representative, Family Supports Permission granted to share information with : No  Share Information with NAME: [name]  Permission granted to share info w AGENCY: [facility]  Permission granted to share info w Relationship: Daughter  Permission granted to share info w Contact Information: [phone]  Emotional Assessment Appearance:: Appears stated age Attitude/Demeanor/Rapport: Unable to Assess Affect (typically observed): Unable to  Assess Orientation: : Oriented to Self Alcohol / Substance Use: Not Applicable Psych Involvement: No (comment)  Admission diagnosis:  Seizure [facility]) [R56.9] Patient Active Problem List   Diagnosis Date Noted   Acute metabolic encephalopathy 08/19/2023   History of CVA (cerebrovascular accident) 08/19/2023   Thrombocytopenia (HCC) 08/19/2023   Hypotension 07/15/2023   Small bowel obstruction (HCC) 07/15/2023   Hemiplegia and 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 side (HCC)    Temporal pain 06/18/2022   Acute kidney injury superimposed on CKD (HCC) 06/17/2022   Dysphagia 06/07/2022   Respiratory distress 06/02/2022   Neuropathy 06/02/2022   Paroxysmal atrial flutter (HCC) 06/01/2022   Aspiration pneumonia (HCC) 06/01/2022   Cervical spinal stenosis 05/31/2022   Chronic diastolic CHF (congestive heart failure) (HCC) 05/28/2022   SBO, recurrent (small bowel obstruction) (HCC)    Abdominal distention    HLD (hyperlipidemia) 10/16/2021   Nausea vomiting and diarrhea 10/16/2021   Sepsis (HCC) 10/16/2021   Tobacco abuse 10/16/2021   Muscle twitching 08/14/2019   UTI (urinary tract infection) 08/03/2019   Ileus (HCC) 08/03/2019   Hypokalemia 08/03/2019   QT prolongation 08/03/2019   Ogilvie syndrome    Acute abdominal pain 04/03/2019   Left-sided weakness 09/30/2017   Seizure (HCC) 09/30/2017   Essential hypertension 09/30/2017   CAD (coronary artery disease) 09/30/2017   COPD (chronic obstructive pulmonary disease) (HCC) 09/30/2017   Depression with anxiety 09/30/2017   PCP:  Keane Police, MD Pharmacy:   [facility] [phone] -  [place] [address] ([place]), [place] - [address] ([place]) [place] [address] Phone: [phone] Fax: [phone]     Social Drivers of Health (SDOH) Social History: SDOH Screenings   Food Insecurity: Patient Unable To Answer (08/20/2023)  Housing: Patient Unable To Answer  (08/20/2023)  Transportation Needs: Patient Unable To Answer (08/20/2023)  Utilities: Patient Unable To Answer (08/20/2023)  Tobacco Use: Medium Risk (08/18/2023)   SDOH Interventions:     Readmission Risk Interventions    08/20/2023   12:32 PM 07/17/2023   12:24 PM 06/30/2022    2:25 PM  Readmission Risk Prevention Plan  Transportation Screening Complete Complete Complete  PCP or Specialist Appt within 3-5 Days  Complete   Social Work Consult for Recovery Care Planning/Counseling  Complete   Palliative Care Screening  Not Applicable   Medication Review [facility]) Complete Complete   PCP or Specialist appointment within 3-5 days of discharge Complete    HRI or Home Care Consult Complete  Complete  SW Recovery Care/Counseling Consult Complete  Complete  Palliative Care Screening Not Applicable  Complete  Skilled Nursing Facility Complete  Complete

## 2023-08-20 NOTE — Evaluation (Signed)
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 side (HCC)    Hypertension    Myocardial infarction (HCC)    Neuromuscular disorder (HCC)    Ogilvie syndrome    Seizures (HCC)    last 6 months ago   Stroke [facility])    multiple  left side weakness   Tremors of nervous system    Unstable angina (HCC)    Past Surgical History:  Past Surgical History:  Procedure Laterality Date   CARPAL TUNNEL RELEASE Left 10/19/2015   Procedure: CARPAL TUNNEL RELEASE;  Surgeon: Kennedy Bucker, MD;  Location: [facility] ORS;  Service: Orthopedics;  Laterality: Left;   COLON SURGERY     COLONOSCOPY WITH PROPOFOL N/A 10/26/2021   Procedure: COLONOSCOPY WITH PROPOFOL;  Surgeon: Regis Bill, MD;  Location: [facility] ENDOSCOPY;  Service: Endoscopy;  Laterality: N/A;   COLONOSCOPY WITH PROPOFOL N/A 06/02/2022   Procedure: COLONOSCOPY WITH PROPOFOL;  Surgeon: Regis Bill, MD;  Location: [facility] ENDOSCOPY;  Service: Endoscopy;  Laterality: N/A;   COLONOSCOPY WITH PROPOFOL N/A 06/06/2022   Procedure: COLONOSCOPY WITH PROPOFOL;  Surgeon: Regis Bill, MD;  Location: [facility] ENDOSCOPY;  Service: Endoscopy;  Laterality: N/A;   JOINT REPLACEMENT     left partial hip    THROAT SURGERY  2013   cancer   HPI:  Calvin [name] is a 72 y.o. male with medical history significant of seizure disorder, CVA with residual left-sided hemiparesis, chronic  HFpEF, CKD stage II, HTN, colon cancer status post transverse colectomy and colostomy, sent from nursing home for evaluation of altered mentations. Per MD notes pt is postictal and confused. CXR No active disease. Mild cardiomegaly.  FEES 07/20/23 penetrated liquids to glottis, absent epiglottic deflection, so PES opening with backflow, senses and ejects penetrate and aspirate and significant pharyngeal residue and harder to clear than thin liqiuds, cleared with liquid wash, recommended full liquids per surgery and advance to puree when clear and pt is expected to cough a great deal.    Assessment / Plan / Recommendation  Clinical Impression  Pt has a history of dysphagia with laryngeal penetration, pharyngeal residue observed during FEES 07/20/23. Oral care provided to remove dried lingual debris that was moderately effective. He is endentulous and was able to assist in holding cup. Signs of pharyngeal dysphagia present with immediate and delayed cough with thin with multiple swallows. He had multiple swallows with cup and straw sips nectar thick but no s/s aspiration. Puree he transited with multiple swallows with suspected pharyngeal residue as noted in FEES last month. Pt at risk of aspiration given history. He is currently on a continuous EEG monitor. Recommend puree diet. nectar thick liquids, crush meds, swallow 2 times. He may benefit from instrumental assessment once off continuous EEG. ST to continue. SLP Visit Diagnosis: Dysphagia, unspecified (R13.10)    Aspiration Risk  Mild aspiration risk;Moderate aspiration  risk    Diet Recommendation Dysphagia 1 (Puree);Nectar-thick liquid    Liquid Administration via: Straw;Cup Medication Administration: Crushed with puree Supervision: Staff to assist with self feeding;Full supervision/cueing for compensatory strategies Compensations: Slow rate;Small sips/bites;Multiple dry swallows after each bite/sip Postural Changes: Seated upright at 90 degrees     Other  Recommendations Oral Care Recommendations: Oral care BID    Recommendations for follow up therapy are one component of a multi-disciplinary discharge planning process, led by the attending physician.  Recommendations may be updated based on patient status, additional functional criteria and insurance authorization.  Follow up Recommendations Skilled nursing-short term rehab (<3 hours/day)      Assistance Recommended at Discharge    Functional Status Assessment Patient has had a recent decline in their functional status and demonstrates the ability to make significant improvements in function in a reasonable and predictable amount of time.  Frequency and Duration min 2x/week  2 weeks       Prognosis Prognosis for improved oropharyngeal function:  (fair-good) Barriers to Reach Goals: Cognitive deficits      Swallow Study   General Date of Onset: 08/20/23 HPI: Calvin [name] is a 72 y.o. male with medical history significant of seizure disorder, CVA with residual left-sided hemiparesis, chronic HFpEF, CKD stage II, HTN, colon cancer status post transverse colectomy and colostomy, sent from nursing home for evaluation of altered mentations. Per MD notes pt is postictal and confused. CXR No active disease. Mild cardiomegaly.  FEES 07/20/23 penetrated liquids to glottis, absent epiglottic deflection, so PES opening with backflow, senses and ejects penetrate and aspirate and significant pharyngeal residue and harder to clear than thin liqiuds, cleared with liquid wash, recommended full liquids per surgery and advance to puree when clear and pt is expected to cough a great deal. Type of Study: Bedside Swallow Evaluation Previous Swallow Assessment:  (see HPI) Diet Prior to this Study: NPO Temperature Spikes Noted: No Respiratory Status: Room air History of Recent Intubation: No Behavior/Cognition: Alert;Cooperative;Requires cueing Oral Cavity Assessment: Dry;Dried secretions Oral  Care Completed by SLP: Yes Oral Cavity - Dentition: Edentulous Vision: Functional for self-feeding Self-Feeding Abilities: Needs assist Patient Positioning: Upright in bed Baseline Vocal Quality: Low vocal intensity    Oral/Motor/Sensory Function Overall Oral Motor/Sensory Function: Within functional limits   Ice Chips Ice chips: Not tested   Thin Liquid Thin Liquid: Impaired Presentation: Cup Pharyngeal  Phase Impairments: Multiple swallows;Cough - Immediate;Cough - Delayed    Nectar Thick Nectar Thick Liquid: Impaired Presentation: Cup;Straw Pharyngeal Phase Impairments: Multiple swallows   Honey Thick Honey Thick Liquid: Not tested   Puree Puree: Impaired Pharyngeal Phase Impairments: Multiple swallows   Solid     Solid: Not tested      Royce Macadamia 08/20/2023,9:16 AM

## 2023-08-20 NOTE — Plan of Care (Signed)
  Problem: Clinical Measurements: Goal: Respiratory complications will improve Outcome: Progressing   Problem: Coping: Goal: Level of anxiety will decrease Outcome: Progressing   Problem: Elimination: Goal: Will not experience complications related to urinary retention Outcome: Progressing

## 2023-08-20 NOTE — Plan of Care (Signed)
  Problem: Education: Goal: Knowledge of General Education information will improve Description: Including pain rating scale, medication(s)/side effects and non-pharmacologic comfort measures Outcome: Progressing   Problem: Activity: Goal: Risk for activity intolerance will decrease Outcome: Progressing   Problem: Nutrition: Goal: Adequate nutrition will be maintained Outcome: Progressing   Problem: Coping: Goal: Level of anxiety will decrease Outcome: Progressing   Problem: Pain Management: Goal: General experience of comfort will improve Outcome: Progressing   Problem: Safety: Goal: Ability to remain free from injury will improve Outcome: Progressing

## 2023-08-20 NOTE — Progress Notes (Signed)
vLTM maintenance  All impedances below 10kohms.  No skin breakdown noted at [place]

## 2023-08-20 NOTE — Progress Notes (Addendum)
Subjective: No acute events overnight.  Was more drowsy this morning when seen by medicine team but significantly more awake on my evaluation at around 1030.  Was able to tell me his name, follows simple commands.  ROS: negative except above  Examination  Vital signs in last 24 hours: Temp:  [98.3 F (36.8 C)-99.1 F (37.3 C)] 99.1 F (37.3 C) (12/16 0400) Pulse Rate:  [70-115] 100 (12/16 0400) Resp:  [17-18] 18 (12/16 0400) BP: (103-153)/(50-93) 120/83 (12/16 0400) SpO2:  [99 %-100 %] 100 % (12/16 0400)  General: lying in bed, NAD Neuro: Awake, alert, oriented to self, location: Nursing facility, month: November, follows commands, cranial nerves appear grossly intact, 3/5 in all 4 extremities.  Had action tremor in both upper extremities  Basic Metabolic Panel: Recent Labs  Lab 08/18/23 1115 08/20/23 0451 08/20/23 0726  NA 141 141  --   K 5.1 5.1  --   CL 106 109  --   CO2 26 20*  --   GLUCOSE 97 72  --   BUN 33* 14  --   CREATININE 1.86* 1.51*  --   CALCIUM 9.1 9.1  --   MG  --   --  1.9  PHOS  --   --  2.7    CBC: Recent Labs  Lab 08/18/23 1115 08/20/23 0451  WBC 9.0 6.5  NEUTROABS 6.8  --   HGB 12.1* 12.3*  HCT 40.5 39.6  MCV 97.8 95.0  PLT 117* 99*     Coagulation Studies: No results for input(s): "LABPROT", "INR" in the last 72 hours.  Imaging personally reviewed  MRI brain without contrast 08/18/2023: No acute intracranial process.  Unchanged right-greater-than-left anterior frontal encephalomalacia, likely sequela of prior trauma.  Unchanged old infarcts in the right frontal operculum and right middle frontal gyrus. Unchanged old infarcts in the right PCA territory and left cerebellar hemisphere.  ASSESSMENT AND PLAN: 72 year old male with prior stroke, epilepsy admitted with altered mental status and concern for breakthrough seizure.   Epilepsy with breakthrough seizure Action tremor -Reportedly patient had seizure-like activity at nursing  facility.  On arrival to hospital, was evaluated by Dr. Wilford Corner and noted to have some left eye twitching.  -No seizures noted overnight on video EEG -Action tremor in both upper extremities which could have been confused for seizures.  This could also be a side effect of Depakote -Acute encephalopathy could be secondary to medications   Recommendations -Will check Depakote level and plan to reduce Depakote to 250mg  Q6h ( home dose was 1000mg  BID) to minimize sedation due to multiple antiseizure medications as well as interactions -Continue lamotrigine  50mg  BID, Pregabalin 50mg  QAM and 150mg  QHS, Keppra 1000mg  BID -Transition Depakote and Keppra to p.o. tomorrow if able to take all other medications p.o. consistently today -Rescue medication at the time of discharge: Intranasal Valtoco 20 mg for seizure lasting more than 2 minutes -Continue seizure precautions -as needed IV ativan for clinical seizures -Management of rest of comorbidities per primary team -Discussed plan with Dr. Thedore Mins via secure chat  I have spent a total of   36 minutes with the patient reviewing hospital notes,  test results, labs and examining the patient as well as establishing an assessment and plan. > 50% of time was spent in direct patient care.        Lindie Spruce Epilepsy [facility] For questions after 5pm please refer to AMION to reach the Neurologist on call

## 2023-08-20 NOTE — Procedures (Addendum)
[name]  MRN: [id number]  Epilepsy Attending: Charlsie Quest  Referring Physician/Provider: Milon Dikes, MD  Duration: 08/19/2023 1415 to 08/20/2023 1415  Patient history:  72 y.o. 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 EEG to evaluate for seizure  Level of alertness: Awake  AEDs during EEG study: LTG, PGB, LEV, VPA  Technical aspects: This EEG study was done with scalp electrodes positioned according to the 10-20 International system of electrode placement. Electrical activity was reviewed with band pass filter of 1-70Hz , sensitivity of 7 uV/mm, display speed of 72mm/sec with a 60Hz  notched filter applied as appropriate. EEG data were recorded continuously and digitally stored.  Video monitoring was available and reviewed as appropriate.  Description: No clear posterior dominant rhythm was seen. EEG showed continuous generalized 3 to 6 Hz theta-delta slowing. Intermittently, rhythmic 4-5 Hz theta slowing was noted in left and right occipital region without definite evolution, lasting about  1 minute. Clinically at times patient had right arm tremor and at times no clinical signs were seen ( lights were off and patient was mostly covered under blanket so difficult to see). Hyperventilation and photic stimulation were not performed.     ABNORMALITY - Continuous slow, generalized  IMPRESSION: This study is suggestive of moderate diffuse encephalopathy. No definite seizures were seen throughout the recording.  Mohit Zirbes Annabelle Harman

## 2023-08-20 NOTE — NC FL2 (Signed)
[place] MEDICAID FL2 LEVEL OF CARE FORM     IDENTIFICATION  [name] Birthdate: Jun 19, 1951 Sex: male Admission Date (Current Location): 08/19/2023  [facility] and [place] Number:  Chiropodist and Address:  [facility] [place], [address], [place], [place] [address]      Provider Number: [id number]  Attending Physician Name and Address:  Leroy Sea, MD  Relative Name and Phone Number:       Current Level of Care: Hospital Recommended Level of Care: Skilled Nursing Facility Prior Approval Number:    Date Approved/Denied:   PASRR Number: [id number]  Discharge Plan: SNF    Current Diagnoses: Patient Active Problem List   Diagnosis Date Noted   Acute metabolic encephalopathy 08/19/2023   History of CVA (cerebrovascular accident) 08/19/2023   Thrombocytopenia (HCC) 08/19/2023   Hypotension 07/15/2023   Small bowel obstruction (HCC) 07/15/2023   Hemiplegia and 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 side (HCC)    Temporal pain 06/18/2022   Acute kidney injury superimposed on CKD (HCC) 06/17/2022   Dysphagia 06/07/2022   Respiratory distress 06/02/2022   Neuropathy 06/02/2022   Paroxysmal atrial flutter (HCC) 06/01/2022   Aspiration pneumonia (HCC) 06/01/2022   Cervical spinal stenosis 05/31/2022   Chronic diastolic CHF (congestive heart failure) (HCC) 05/28/2022   SBO, recurrent (small bowel obstruction) (HCC)    Abdominal distention    HLD (hyperlipidemia) 10/16/2021   Nausea vomiting and diarrhea 10/16/2021   Sepsis (HCC) 10/16/2021   Tobacco abuse 10/16/2021   Muscle twitching 08/14/2019   UTI (urinary tract infection) 08/03/2019   Ileus (HCC) 08/03/2019   Hypokalemia 08/03/2019   QT prolongation 08/03/2019   Ogilvie syndrome    Acute abdominal pain 04/03/2019   Left-sided weakness 09/30/2017   Seizure (HCC) 09/30/2017   Essential hypertension 09/30/2017   CAD (coronary  artery disease) 09/30/2017   COPD (chronic obstructive pulmonary disease) (HCC) 09/30/2017   Depression with anxiety 09/30/2017    Orientation RESPIRATION BLADDER Height & Weight     Self  Normal Incontinent, External catheter Weight:   Height:     BEHAVIORAL SYMPTOMS/MOOD NEUROLOGICAL BOWEL NUTRITION STATUS      Continent Diet (See dc summary)  AMBULATORY STATUS COMMUNICATION OF NEEDS Skin   Extensive Assist Verbally Normal                       Personal Care Assistance Level of Assistance  Bathing, Feeding, Dressing Bathing Assistance: Maximum assistance Feeding assistance: Maximum assistance Dressing Assistance: Maximum assistance     Functional Limitations Info             SPECIAL CARE FACTORS FREQUENCY                       Contractures Contractures Info: Not present    Additional Factors Info  Code Status, Allergies, Isolation Precautions Code Status Info: Full Allergies Info: NKA     Isolation Precautions Info: MRSA nasal swab     Current Medications (08/20/2023):  This is the current hospital active medication list Current Facility-Administered Medications  Medication Dose Route Frequency Provider Last Rate Last Admin   0.9 %  sodium chloride infusion   Intravenous Continuous Leroy Sea, MD 75 mL/hr at 08/20/23 1035 New Bag at 08/20/23 1035   acetaminophen (TYLENOL) tablet 650 mg  650 mg Oral Q6H PRN Alan Mulder, MD       Or   acetaminophen (  TYLENOL) suppository 650 mg  650 mg Rectal Q6H PRN Alan Mulder, MD       aspirin chewable tablet 81 mg  81 mg Oral Daily Arma Heading, RPH   81 mg at 08/20/23 3875   baclofen (LIORESAL) tablet 5 mg  5 mg Oral BID Alan Mulder, MD       diclofenac Sodium (VOLTAREN) 1 % topical gel 2 g  2 g Topical QID PRN Opyd, Lavone Neri, MD   2 g at 08/19/23 2343   enoxaparin (LOVENOX) injection 40 mg  40 mg Subcutaneous Q24H Alan Mulder, MD   40 mg at 08/19/23 2032   hydrALAZINE (APRESOLINE)  injection 5 mg  5 mg Intravenous Q6H PRN Alan Mulder, MD   5 mg at 08/19/23 2032   lactulose (CHRONULAC) 10 GM/15ML solution 20 g  20 g Oral Daily Dorrell, Molly Maduro, MD       lamoTRIgine (LAMICTAL) tablet 50 mg  50 mg Oral BID Alan Mulder, MD   50 mg at 08/20/23 6433   levETIRAcetam (KEPPRA) IVPB 1000 mg/100 mL premix  1,000 mg Intravenous Jeani Sow, MD 400 mL/hr at 08/20/23 0159 1,000 mg at 08/20/23 0159   LORazepam (ATIVAN) injection 1 mg  1 mg Intravenous Q4H PRN Alan Mulder, MD       ondansetron [facility]) tablet 4 mg  4 mg Oral Q6H PRN Alan Mulder, MD       Or   ondansetron (ZOFRAN) injection 4 mg  4 mg Intravenous Q6H PRN Alan Mulder, MD       pantoprazole (PROTONIX) injection 40 mg  40 mg Intravenous Q24H Alan Mulder, MD   40 mg at 08/19/23 1657   polyethylene glycol (MIRALAX / GLYCOLAX) packet 17 g  17 g Oral BID Alan Mulder, MD       pregabalin (LYRICA) capsule 100 mg  100 mg Oral QHS Dorrell, Molly Maduro, MD       pregabalin (LYRICA) capsule 50 mg  50 mg Oral BID Alan Mulder, MD   50 mg at 08/20/23 1037   tamsulosin (FLOMAX) capsule 0.4 mg  0.4 mg Oral QPM Dorrell, Robert, MD       valproate (DEPACON) 250 mg in dextrose 5 % 50 mL IVPB  250 mg Intravenous Q6H Charlsie Quest, MD         Discharge Medications: Please see discharge summary for a list of discharge medications.  Relevant Imaging Results:  Relevant Lab Results:   Additional Information SS#: [id number]  Renne Crigler Greidys Deland, LCSW

## 2023-08-20 NOTE — Evaluation (Signed)
Occupational Therapy Evaluation Patient Details Name: Calvin [name] MRN: [id number] DOB: 1951/04/24 Today's Date: 08/20/2023   History of Present Illness 72 year old male presented to the emergency department 08/19/23 from a nursing home with altered mental status.  Patient was confused and unable to provide history at [facility].  At that time family reported patient's had frequent episodes of confusion and worsening failure to thrive.  He was afebrile and hemodynamically stable.  CT head was obtained which showed no acute intracranial abnormalities.  Laboratory workup revealed creatinine 1.8, WBC 9.0, hemoglobin 12, urinalysis negative for UTI MR head was obtained which showed no acute processes with unchanged old infarcts.  Neurology was consulted and recommended transfer to [facility] for EEG.   Clinical Impression   Pt is a resident of SNF and dependent in mobility and ADLs at baseline per chart review and family report. Pt with minimal interest in eating, requires feeding as he will not initiate on his own per nurse. Pt unaware of wet gown and bed pad. Total assist to don socks, change gown and to roll for pericare and change of linen. Nursing notified of failed Primofit. No further acute OT needs. Family anticipates pt discharging to SNF.      If plan is discharge home, recommend the following: Two people to help with walking and/or transfers;Two people to help with bathing/dressing/bathroom;Assistance with cooking/housework;Assistance with feeding;Direct supervision/assist for medications management;Direct supervision/assist for financial management;Assist for transportation;[name]    Functional Status Assessment  Patient has not had a recent decline in their functional status  Equipment Recommendations  None recommended by OT    Recommendations for Other Services       Precautions / Restrictions Precautions Precautions:  Fall Restrictions Weight Bearing Restrictions Per Provider Order: No      Mobility Bed Mobility Overal bed mobility: Needs Assistance Bed Mobility: Rolling Rolling: +2 for physical assistance, Total assist         General bed mobility comments: does not initiate, stating "Wait a minute" with all attempts to roll for clean up    Transfers                   General transfer comment: Did not attempt, will require maximove      Balance                                           ADL either performed or assessed with clinical judgement   ADL Overall ADL's : At baseline                                             Vision Patient Visual Report: No change from baseline       Perception         Praxis         Pertinent Vitals/Pain Pain Assessment Pain Assessment: Faces Faces Pain Scale: No hurt     Extremity/Trunk Assessment Upper Extremity Assessment Upper Extremity Assessment: Right hand dominant;LUE deficits/detail LUE Deficits / Details: longstanding hemiparesis LUE Coordination: decreased fine motor;decreased gross motor   Lower Extremity Assessment Lower Extremity Assessment: Defer to PT evaluation   Cervical / Trunk Assessment Cervical / Trunk Assessment: Other exceptions (weakness)  Communication Communication Communication: Difficulty communicating thoughts/reduced clarity of speech Cueing Techniques: Verbal cues;Gestural cues;Tactile cues   Cognition Arousal: Alert Behavior During Therapy: Flat affect Overall Cognitive Status: History of cognitive impairments - at baseline                                       General Comments       Exercises     Shoulder Instructions      Home Living Family/patient expects to be discharged to:: Skilled nursing facility                                 Additional Comments: Family reports patient required assist at SNF for mobility  and has not been able to stand for months.      Prior Functioning/Environment Prior Level of Function : Needs assist  Cognitive Assist : Mobility (cognitive) Mobility (Cognitive): Intermittent cues   Physical Assist : Mobility (physical) Mobility (physical): Bed mobility;Transfers   Mobility Comments: dependent ADLs Comments: dependent        OT Problem List:        OT Treatment/Interventions:      OT Goals(Current goals can be found in the care plan section)    OT Frequency:      Co-evaluation   Reason for Co-Treatment: For patient/therapist safety;Necessary to address cognition/behavior during functional activity;To address functional/ADL transfers PT goals addressed during session: Mobility/safety with mobility        AM-PAC OT "6 Clicks" Daily Activity     Outcome Measure Help from another person eating meals?: A Lot Help from another person taking care of personal grooming?: Total Help from another person toileting, which includes using toliet, bedpan, or urinal?: Total Help from another person bathing (including washing, rinsing, drying)?: Total Help from another person to put on and taking off regular upper body clothing?: Total Help from another person to put on and taking off regular lower body clothing?: Total 6 Click Score: 7   End of Session Nurse Communication: Other (comment) (primofit leaking)  Activity Tolerance: Patient tolerated treatment well Patient left: in bed;with bed alarm set;with call bell/phone within reach;with family/visitor present  OT Visit Diagnosis: Hemiplegia and hemiparesis;Muscle weakness (generalized) (M62.81);Other symptoms and signs involving cognitive function Hemiplegia - Right/Left: Left Hemiplegia - dominant/non-dominant: Non-Dominant Hemiplegia - caused by: Cerebral infarction                Time: 0102-7253 OT Time Calculation (min): 26 min Charges:  OT General Charges $OT Visit: 1 Visit OT Evaluation $OT Eval  Moderate Complexity: 1 Mod  Berna Spare, OTR/L Acute Rehabilitation Services Office: [phone]   Evern Bio 08/20/2023, 1:46 PM

## 2023-08-20 NOTE — Progress Notes (Addendum)
PROGRESS NOTE                                                                                                                                                                                                             Patient Demographics:    Calvin [name], is a 72 y.o. male, DOB - 06/15/1951, UVO:[id number]  Outpatient Primary MD for the patient is Keane Police, MD    LOS - 1  Admit date - 08/19/2023    No chief complaint on file.      Brief Narrative (HPI from H&P)    72 y.o. male with medical history significant of seizure disorder, CVA, heart failure with preserved ejection fraction, CKD stage II, hypertension, colon cancer status post colectomy who presented to the emergency department from a nursing home with altered mental status.    Patient was confused and unable to provide history at [facility].  At that time family reported patient's had frequent episodes of confusion and worsening failure to thrive.  He was afebrile and hemodynamically stable, urinalysis negative for UTI MR head was obtained which showed no acute processes with unchanged old infarcts.  Neurology was consulted and recommended transfer to [facility] for EEG.  Patient was kept on IV Keppra and IV Depakote.    Subjective:    Calvin [name] today in bed, somnolent, unable to answer questions or follow commands, moves right side more than the left.   Assessment  & Plan :   Acute metabolic encephalopathy in a patient with history of stroke, remote history of seizure alcohol induced according to the family.  Seen by neurology at [facility], MRI brain nonacute but does confirm old strokes, currently on IV Keppra and IV Depakote, mentation still not back to baseline, undergoing continuous EEG.  Will defer further management to neurology.  Have communicated to neurology to see the patient today.  Till then continue supportive care  with IV fluids, oral intake is questionable due to underlying mental status.  # BPH-continue Flomax he is able to take oral medications.  AKI on CKD stage IIIa-patient's creatinine was elevated at 1.8.  Patient was placed on IV fluids with improvement.  Will plan to check labs tomorrow   Hypertension-as needed IV hydralazine   History of CVA-continue aspirin   Muscle spasms-continue baclofen  Condition -  Guarded  Family Communication  :  sisters Marylu Lund [phone]  on 08/20/23  Code Status :  Full  Consults  :  Neuro  PUD Prophylaxis :  PPI   Procedures  :     MRI -  1. No acute intracranial process. 2. Unchanged right-greater-than-left anterior frontal encephalomalacia, likely sequela of prior trauma. 3. Unchanged old infarcts in the right frontal operculum and right middle frontal gyrus. 4. Unchanged old infarcts in the right PCA territory and left cerebellar hemisphere.  EEG -       Disposition Plan  :    Status is: Inpatient  DVT Prophylaxis  :    enoxaparin (LOVENOX) injection 40 mg Start: 08/19/23 2200 SCDs Start: 08/19/23 1312    Lab Results  Component Value Date   PLT 99 (L) 08/20/2023    Diet :  Diet Order             DIET - DYS 1 Room service appropriate? No; Fluid consistency: Nectar Thick  Diet effective now                    Inpatient Medications  Scheduled Meds:  aspirin  81 mg Oral Daily   baclofen  5 mg Oral BID   enoxaparin (LOVENOX) injection  40 mg Subcutaneous Q24H   lactulose  20 g Oral Daily   lamoTRIgine  50 mg Oral BID   pantoprazole  40 mg Intravenous Q24H   polyethylene glycol  17 g Oral BID   pregabalin  100 mg Oral QHS   pregabalin  50 mg Oral BID   tamsulosin  0.4 mg Oral QPM   Continuous Infusions:  levETIRAcetam 1,000 mg (08/20/23 0159)   valproate (DEPACON) 500 mg in dextrose 5 % 50 mL IVPB 500 mg (08/20/23 0957)   PRN Meds:.acetaminophen **OR** acetaminophen, diclofenac Sodium, hydrALAZINE,  LORazepam, ondansetron **OR** ondansetron (ZOFRAN) IV  Antibiotics  :    Anti-infectives (From admission, onward)    None         Objective:   Vitals:   08/19/23 2324 08/19/23 2346 08/20/23 0000 08/20/23 0400  BP:  (!) 153/93 129/80 120/83  Pulse: (!) 115 (!) 104 (!) 105 100  Resp: 17   18  Temp: 98.7 F (37.1 C)   99.1 F (37.3 C)  TempSrc: Axillary     SpO2: 100% 99% 99% 100%    Wt Readings from Last 3 Encounters:  08/18/23 90.7 kg  07/19/23 90 kg  07/15/23 90 kg     Intake/Output Summary (Last 24 hours) at 08/20/2023 1010 Last data filed at 08/20/2023 0600 Gross per 24 hour  Intake 365 ml  Output --  Net 365 ml     Physical Exam  Somnolent, No new F.N deficits, Normal affect [place].AT,PERRAL Supple Neck, No JVD,   Symmetrical Chest wall movement, Good air movement bilaterally, CTAB RRR,No Gallops,Rubs or new Murmurs,  +ve B.Sounds, Abd Soft, No tenderness,   No Cyanosis, Clubbing or edema       Data Review:    Recent Labs  Lab 08/18/23 1115 08/20/23 0451  WBC 9.0 6.5  HGB 12.1* 12.3*  HCT 40.5 39.6  PLT 117* 99*  MCV 97.8 95.0  MCH 29.2 29.5  MCHC 29.9* 31.1  RDW 13.7 14.0  LYMPHSABS 1.1  --   MONOABS 0.9  --   EOSABS 0.0  --   BASOSABS 0.0  --     Recent Labs  Lab 08/18/23 1115 08/20/23 0451  08/20/23 0726  NA 141 141  --   K 5.1 5.1  --   CL 106 109  --   CO2 26 20*  --   ANIONGAP 9 12  --   GLUCOSE 97 72  --   BUN 33* 14  --   CREATININE 1.86* 1.51*  --   AST 13*  --   --   ALT 12  --   --   ALKPHOS 40  --   --   BILITOT <0.2  --   --   ALBUMIN 3.5  --   --   LATICACIDVEN 1.0  --   --   AMMONIA  --   --  26  MG  --   --  1.9  CALCIUM 9.1 9.1  --       Recent Labs  Lab 08/18/23 1115 08/20/23 0451 08/20/23 0726  LATICACIDVEN 1.0  --   --   AMMONIA  --   --  26  MG  --   --  1.9  CALCIUM 9.1 9.1  --      --------------------------------------------------------------------------------------------------------------- Lab Results  Component Value Date   CHOL 117 05/29/2022   HDL 48 05/29/2022   LDLCALC 57 05/29/2022   TRIG 66 06/29/2022   CHOLHDL 2.4 05/29/2022    Lab Results  Component Value Date   HGBA1C 5.8 (H) 05/28/2022    Radiology Reports MR BRAIN WO CONTRAST Result Date: 08/18/2023 CLINICAL DATA:  Neuro deficit, acute, stroke suspected. Altered mental status. EXAM: MRI HEAD WITHOUT CONTRAST TECHNIQUE: Multiplanar, multiecho pulse sequences of the brain and surrounding structures were obtained without intravenous contrast. COMPARISON:  MRI brain 07/18/2023.  Head CT 08/18/2023. FINDINGS: Brain: No acute infarct or hemorrhage. Unchanged right-greater-than-left anterior frontal encephalomalacia, likely sequela of prior trauma. Unchanged encephalomalacia along the right frontal operculum and right middle frontal gyrus, likely sequela of prior infarct. Unchanged old infarcts in the right PCA territory and left cerebellar hemisphere. No hydrocephalus or extra-axial collection. No mass or midline shift. Vascular: Normal flow voids. Skull and upper cervical spine: Normal marrow signal. Sinuses/Orbits: Negative. Other: None. IMPRESSION: 1. No acute intracranial process. 2. Unchanged right-greater-than-left anterior frontal encephalomalacia, likely sequela of prior trauma. 3. Unchanged old infarcts in the right frontal operculum and right middle frontal gyrus. 4. Unchanged old infarcts in the right PCA territory and left cerebellar hemisphere. Electronically Signed   By: Orvan Falconer M.D.   On: 08/18/2023 19:38   DG Chest 1 View Result Date: 08/18/2023 CLINICAL DATA:  Left arm shaking unresponsive EXAM: CHEST  1 VIEW COMPARISON:  07/28/2023 FINDINGS: Mild cardiomegaly. No acute airspace disease or pleural effusion. No pneumothorax. Suspected AVN type changes of left humeral head. IMPRESSION:  No active disease. Mild cardiomegaly. Electronically Signed   By: Jasmine Pang M.D.   On: 08/18/2023 15:54   CT Head Wo Contrast Result Date: 08/18/2023 CLINICAL DATA:  Mental status change, unknown cause EXAM: CT HEAD WITHOUT CONTRAST TECHNIQUE: Contiguous axial images were obtained from the base of the skull through the vertex without intravenous contrast. RADIATION DOSE REDUCTION: This exam was performed according to the departmental dose-optimization program which includes automated exposure control, adjustment of the mA and/or kV according to patient size and/or use of iterative reconstruction technique. COMPARISON:  CT head July 17, 2023. MRI head July 18, 2023 FINDINGS: Brain: Similar multifocal encephalomalacia, including in the inferior right greater than left frontal lobes, more superior right frontal lobe, and right parieto-occipital region. Small remote left cerebellar infarct.  No evidence of acute large vascular territory infarct, acute hemorrhage, mass lesion, midline shift or hydrocephalus. Vascular: Calcific atherosclerosis. No hyperdense vessel identified. Skull: No acute fracture. Sinuses/Orbits: Mostly clear sinuses.  No acute orbital findings. Other: Mastoid effusions. IMPRESSION: 1. No evidence of acute intracranial abnormality. 2. Similar multifocal encephalomalacia, detailed above. Electronically Signed   By: Feliberto Harts M.D.   On: 08/18/2023 12:22      Signature  -   Susa Raring M.D on 08/20/2023 at 10:10 AM   -  To page go to [email or web address]

## 2023-08-21 DIAGNOSIS — R569 Unspecified convulsions: Secondary | ICD-10-CM | POA: Diagnosis not present

## 2023-08-21 LAB — CBC WITH DIFFERENTIAL/PLATELET
Abs Immature Granulocytes: 0.15 10*3/uL — ABNORMAL HIGH (ref 0.00–0.07)
Basophils Absolute: 0 10*3/uL (ref 0.0–0.1)
Basophils Relative: 0 %
Eosinophils Absolute: 0 10*3/uL (ref 0.0–0.5)
Eosinophils Relative: 0 %
HCT: 40.4 % (ref 39.0–52.0)
Hemoglobin: 12.6 g/dL — ABNORMAL LOW (ref 13.0–17.0)
Immature Granulocytes: 2 %
Lymphocytes Relative: 19 %
Lymphs Abs: 1.6 10*3/uL (ref 0.7–4.0)
MCH: 29.4 pg (ref 26.0–34.0)
MCHC: 31.2 g/dL (ref 30.0–36.0)
MCV: 94.4 fL (ref 80.0–100.0)
Monocytes Absolute: 0.8 10*3/uL (ref 0.1–1.0)
Monocytes Relative: 9 %
Neutro Abs: 5.7 10*3/uL (ref 1.7–7.7)
Neutrophils Relative %: 70 %
Platelets: 73 10*3/uL — ABNORMAL LOW (ref 150–400)
RBC: 4.28 MIL/uL (ref 4.22–5.81)
RDW: 13.9 % (ref 11.5–15.5)
WBC: 8.2 10*3/uL (ref 4.0–10.5)
nRBC: 0 % (ref 0.0–0.2)

## 2023-08-21 LAB — COMPREHENSIVE METABOLIC PANEL
ALT: 12 U/L (ref 0–44)
AST: 19 U/L (ref 15–41)
Albumin: 2.7 g/dL — ABNORMAL LOW (ref 3.5–5.0)
Alkaline Phosphatase: 32 U/L — ABNORMAL LOW (ref 38–126)
Anion gap: 11 (ref 5–15)
BUN: 17 mg/dL (ref 8–23)
CO2: 20 mmol/L — ABNORMAL LOW (ref 22–32)
Calcium: 8.7 mg/dL — ABNORMAL LOW (ref 8.9–10.3)
Chloride: 109 mmol/L (ref 98–111)
Creatinine, Ser: 1.28 mg/dL — ABNORMAL HIGH (ref 0.61–1.24)
GFR, Estimated: 59 mL/min — ABNORMAL LOW (ref >60)
Glucose, Bld: 82 mg/dL (ref 70–99)
Potassium: 5.4 mmol/L — ABNORMAL HIGH (ref 3.5–5.1)
Sodium: 140 mmol/L (ref 135–145)
Total Bilirubin: 0.8 mg/dL (ref <1.2)
Total Protein: 6 g/dL — ABNORMAL LOW (ref 6.5–8.1)

## 2023-08-21 LAB — PHOSPHORUS: Phosphorus: 4 mg/dL (ref 2.5–4.6)

## 2023-08-21 LAB — AMMONIA: Ammonia: 84 umol/L — ABNORMAL HIGH (ref 9–35)

## 2023-08-21 LAB — BRAIN NATRIURETIC PEPTIDE: B Natriuretic Peptide: 112.4 pg/mL — ABNORMAL HIGH (ref 0.0–100.0)

## 2023-08-21 LAB — MAGNESIUM: Magnesium: 1.9 mg/dL (ref 1.7–2.4)

## 2023-08-21 MED ORDER — DIVALPROEX SODIUM 500 MG PO DR TAB: 500.0000 mg | DELAYED_RELEASE_TABLET | Freq: Every day | ORAL | Status: DC

## 2023-08-21 MED ORDER — DIVALPROEX SODIUM 500 MG PO DR TAB
500.0000 mg | DELAYED_RELEASE_TABLET | Freq: Two times a day (BID) | ORAL | Status: DC
  Administered 2023-08-22 – 2023-08-23 (×3): 500 mg via ORAL
  Filled 2023-08-21 (×5): qty 1

## 2023-08-21 MED ORDER — PANTOPRAZOLE SODIUM 40 MG PO TBEC
40.0000 mg | DELAYED_RELEASE_TABLET | Freq: Every day | ORAL | Status: DC
  Administered 2023-08-21 – 2023-08-23 (×3): 40 mg via ORAL
  Filled 2023-08-21 (×3): qty 1

## 2023-08-21 MED ORDER — DIVALPROEX SODIUM 250 MG PO DR TAB: 250.0000 mg | DELAYED_RELEASE_TABLET | Freq: Every day | ORAL | Status: DC

## 2023-08-21 MED ORDER — LEVETIRACETAM 500 MG PO TABS
1000.0000 mg | ORAL_TABLET | Freq: Two times a day (BID) | ORAL | Status: DC
  Administered 2023-08-21 – 2023-08-23 (×4): 1000 mg via ORAL
  Filled 2023-08-21 (×4): qty 2

## 2023-08-21 MED ORDER — DIVALPROEX SODIUM 125 MG PO DR TAB: 125.0000 mg | DELAYED_RELEASE_TABLET | Freq: Every day | ORAL | Status: DC

## 2023-08-21 NOTE — Progress Notes (Signed)
vLTM discontinued.  [facility] notified.  No skin breakdown noted at all skin sites. ?

## 2023-08-21 NOTE — Progress Notes (Signed)
PROGRESS NOTE                                                                                                                                                                                                             Patient Demographics:    Calvin [name], is a 72 y.o. male, DOB - 01-Dec-1950, NUU:[id number]  Outpatient Primary MD for the patient is Keane Police, MD    LOS - 2  Admit date - 08/19/2023    No chief complaint on file.      Brief Narrative (HPI from H&P)    72 y.o. male with medical history significant of seizure disorder, CVA, heart failure with preserved ejection fraction, CKD stage II, hypertension, colon cancer status post colectomy who presented to the emergency department from a nursing home with altered mental status.    Patient was confused and unable to provide history at [facility].  At that time family reported patient's had frequent episodes of confusion and worsening failure to thrive.  He was afebrile and hemodynamically stable, urinalysis negative for UTI MR head was obtained which showed no acute processes with unchanged old infarcts.  Neurology was consulted and recommended transfer to [facility] for EEG.  Patient was kept on IV Keppra and IV Depakote.    Subjective:    [name] today in bed, he is to be in no discomfort, more awake than yesterday but still overall quite weak, follows basic commands, denies any headache chest or abdominal pain.   Assessment  & Plan :   Acute metabolic encephalopathy in a patient with history of stroke, remote history of seizure alcohol induced according to the family.  Seen by neurology at [facility], MRI brain nonacute but does confirm old strokes, EEG here does not show any ongoing seizures, and by neurologist here seizure medications adjusted, looks like Depakote dose has been dropped, continue present medications and monitor.   Still quite weak according to family not at his baseline, continue PT OT and speech evaluation.  # BPH-continue Flomax he is able to take oral medications.  AKI on CKD stage IIIa -patient's creatinine was elevated at 1.8.  Patient was placed on IV fluids with improvement.  Monitor closely.   Hypertension -as needed IV hydralazine   History of CVA -continue aspirin   Muscle spasms -continue baclofen  Condition -  Guarded  Family Communication  :    sisters Marylu Lund [phone]  on 08/20/23  Loletta Parish [phone]  on 08/21/2023  Code Status :  Full  Consults  :  Neuro  PUD Prophylaxis :  PPI   Procedures  :     MRI -  1. No acute intracranial process. 2. Unchanged right-greater-than-left anterior frontal encephalomalacia, likely sequela of prior trauma. 3. Unchanged old infarcts in the right frontal operculum and right middle frontal gyrus. 4. Unchanged old infarcts in the right PCA territory and left cerebellar hemisphere.  EEG -  This study is suggestive of moderate diffuse encephalopathy. No definite seizures were seen throughout the recording.       Disposition Plan  :    Status is: Inpatient  DVT Prophylaxis  :    enoxaparin (LOVENOX) injection 40 mg Start: 08/19/23 2200 SCDs Start: 08/19/23 1312    Lab Results  Component Value Date   PLT 73 (L) 08/21/2023    Diet :  Diet Order             DIET - DYS 1 Room service appropriate? No; Fluid consistency: Nectar Thick  Diet effective now                    Inpatient Medications  Scheduled Meds:  aspirin  81 mg Oral Daily   baclofen  5 mg Oral BID   enoxaparin (LOVENOX) injection  40 mg Subcutaneous Q24H   lactulose  20 g Oral Daily   lamoTRIgine  50 mg Oral BID   pantoprazole  40 mg Intravenous Q24H   polyethylene glycol  17 g Oral BID   pregabalin  100 mg Oral QHS   pregabalin  50 mg Oral BID   tamsulosin  0.4 mg Oral QPM   Continuous Infusions:  sodium chloride 75 mL/hr at  08/20/23 1604   levETIRAcetam 1,000 mg (08/21/23 0338)   valproate (DEPACON) 250 mg in dextrose 5 % 50 mL IVPB 250 mg (08/21/23 0359)   PRN Meds:.acetaminophen **OR** acetaminophen, diclofenac Sodium, hydrALAZINE, LORazepam, ondansetron **OR** ondansetron (ZOFRAN) IV  Antibiotics  :    Anti-infectives (From admission, onward)    None         Objective:   Vitals:   08/21/23 0000 08/21/23 0200 08/21/23 0400 08/21/23 0832  BP: 116/65  109/67   Pulse: 97 83 80   Resp: (!) 25 19 17    Temp: (!) 97.5 F (36.4 C)  98.3 F (36.8 C)   TempSrc: Axillary  Axillary Oral  SpO2: 96% 95% 96%     Wt Readings from Last 3 Encounters:  08/18/23 90.7 kg  07/19/23 90 kg  07/15/23 90 kg     Intake/Output Summary (Last 24 hours) at 08/21/2023 0841 Last data filed at 08/21/2023 2956 Gross per 24 hour  Intake 511.25 ml  Output 300 ml  Net 211.25 ml     Physical Exam  Somnolent, No new F.N deficits, Normal affect [place].AT,PERRAL Supple Neck, No JVD,   Symmetrical Chest wall movement, Good air movement bilaterally, CTAB RRR,No Gallops,Rubs or new Murmurs,  +ve B.Sounds, Abd Soft, No tenderness,   No Cyanosis, Clubbing or edema       Data Review:    Recent Labs  Lab 08/18/23 1115 08/20/23 0451 08/21/23 0522  WBC 9.0 6.5 8.2  HGB 12.1* 12.3* 12.6*  HCT 40.5 39.6 40.4  PLT 117* 99* 73*  MCV 97.8 95.0 94.4  MCH 29.2 29.5 29.4  MCHC 29.9* 31.1 31.2  RDW 13.7 14.0 13.9  LYMPHSABS 1.1  --  1.6  MONOABS 0.9  --  0.8  EOSABS 0.0  --  0.0  BASOSABS 0.0  --  0.0    Recent Labs  Lab 08/18/23 1115 08/20/23 0451 08/20/23 0726 08/21/23 0522  NA 141 141  --   --   K 5.1 5.1  --   --   CL 106 109  --   --   CO2 26 20*  --   --   ANIONGAP 9 12  --   --   GLUCOSE 97 72  --   --   BUN 33* 14  --   --   CREATININE 1.86* 1.51*  --   --   AST 13*  --   --   --   ALT 12  --   --   --   ALKPHOS 40  --   --   --   BILITOT <0.2  --   --   --   ALBUMIN 3.5  --   --   --    LATICACIDVEN 1.0  --   --   --   AMMONIA  --   --  26  --   BNP  --   --   --  112.4*  MG  --   --  1.9  --   CALCIUM 9.1 9.1  --   --       Recent Labs  Lab 08/18/23 1115 08/20/23 0451 08/20/23 0726 08/21/23 0522  LATICACIDVEN 1.0  --   --   --   AMMONIA  --   --  26  --   BNP  --   --   --  112.4*  MG  --   --  1.9  --   CALCIUM 9.1 9.1  --   --     --------------------------------------------------------------------------------------------------------------- Lab Results  Component Value Date   CHOL 117 05/29/2022   HDL 48 05/29/2022   LDLCALC 57 05/29/2022   TRIG 66 06/29/2022   CHOLHDL 2.4 05/29/2022    Lab Results  Component Value Date   HGBA1C 5.8 (H) 05/28/2022    Radiology Reports Overnight EEG with video Result Date: 08/20/2023 Charlsie Quest, MD     08/20/2023 12:05 PM Patient Name: LATRAVIOUS SUBASIC MRN: [id number] Epilepsy Attending: Charlsie Quest Referring Physician/Provider: Milon Dikes, MD Duration: 08/19/2023 1415 to 08/20/2023 1150 Patient history:  72 y.o. 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 EEG to evaluate for seizure Level of alertness: Awake AEDs during EEG study: LTG, PGB, LEV, VPA Technical aspects: This EEG study was done with scalp electrodes positioned according to the 10-20 International system of electrode placement. Electrical activity was reviewed with band pass filter of 1-70Hz , sensitivity of 7 uV/mm, display speed of 57mm/sec with a 60Hz  notched filter applied as appropriate. EEG data were recorded continuously and digitally stored.  Video monitoring was available and  reviewed as appropriate. Description: No clear posterior dominant rhythm was seen. EEG showed continuous generalized 3 to 6 Hz theta-delta slowing. Intermittently, rhythmic 4-5 Hz theta slowing was noted in left and right occipital region without definite evolution, lasting about  1 minute. Clinically at times patient had right arm tremor and at times no clinical signs were seen ( lights were off and patient was mostly covered under blanket so difficult to see). Hyperventilation and photic stimulation were not performed.   ABNORMALITY - Continuous slow, generalized IMPRESSION: This study is suggestive of moderate diffuse encephalopathy. No definite seizures were seen throughout the recording. Charlsie Quest   MR BRAIN WO CONTRAST Result Date: 08/18/2023 CLINICAL DATA:  Neuro deficit, acute, stroke suspected. Altered mental status. EXAM: MRI HEAD WITHOUT CONTRAST TECHNIQUE: Multiplanar, multiecho pulse sequences of the brain and surrounding structures were obtained without intravenous contrast. COMPARISON:  MRI brain 07/18/2023.  Head CT 08/18/2023. FINDINGS: Brain: No acute infarct or hemorrhage. Unchanged right-greater-than-left anterior frontal encephalomalacia, likely sequela of prior trauma. Unchanged encephalomalacia along the right frontal operculum and right middle frontal gyrus, likely sequela of prior infarct. Unchanged old infarcts in the right PCA territory and left cerebellar hemisphere. No hydrocephalus or extra-axial collection. No mass or midline shift. Vascular: Normal flow voids. Skull and upper cervical spine: Normal marrow signal. Sinuses/Orbits: Negative. Other: None. IMPRESSION: 1. No acute intracranial process. 2. Unchanged right-greater-than-left anterior frontal encephalomalacia, likely sequela of prior trauma. 3. Unchanged old infarcts in the right frontal operculum and right middle frontal gyrus. 4. Unchanged old infarcts in the right PCA territory and left cerebellar hemisphere.  Electronically Signed   By: Orvan Falconer M.D.   On: 08/18/2023 19:38   DG Chest 1 View Result Date: 08/18/2023 CLINICAL DATA:  Left arm shaking unresponsive EXAM: CHEST  1 VIEW COMPARISON:  07/28/2023 FINDINGS: Mild cardiomegaly. No acute airspace disease or pleural effusion. No pneumothorax. Suspected AVN type changes of left humeral head. IMPRESSION: No active disease. Mild cardiomegaly. Electronically Signed   By: Jasmine Pang M.D.   On: 08/18/2023 15:54   CT Head Wo Contrast Result Date: 08/18/2023 CLINICAL DATA:  Mental status change, unknown cause EXAM: CT HEAD WITHOUT CONTRAST TECHNIQUE: Contiguous axial images were obtained from the base of the skull through the vertex without intravenous contrast. RADIATION DOSE REDUCTION: This exam was performed according to the departmental dose-optimization program which includes automated exposure control, adjustment of the mA and/or kV according to patient size and/or use of iterative reconstruction technique. COMPARISON:  CT head July 17, 2023. MRI head July 18, 2023 FINDINGS: Brain: Similar multifocal encephalomalacia, including in the inferior right greater than left frontal lobes, more superior right frontal lobe, and right parieto-occipital region. Small remote left cerebellar infarct. No evidence of acute large vascular territory infarct, acute hemorrhage, mass lesion, midline shift or hydrocephalus. Vascular: Calcific atherosclerosis. No hyperdense vessel identified. Skull: No acute fracture. Sinuses/Orbits: Mostly clear sinuses.  No acute orbital findings. Other: Mastoid effusions. IMPRESSION: 1. No evidence of acute intracranial abnormality. 2. Similar multifocal encephalomalacia, detailed above. Electronically Signed   By: Feliberto Harts M.D.   On: 08/18/2023 12:22      Signature  -   Susa Raring M.D on 08/21/2023 at 8:41 AM   -  To page go to [email or web address]

## 2023-08-21 NOTE — Progress Notes (Signed)
vLTM maintenance  All impedances below 10k.  No skin breakdown noted at A1  A2  01 02

## 2023-08-21 NOTE — Progress Notes (Signed)
Speech Language Pathology Treatment: Dysphagia  Patient Details Name: Calvin [name] MRN: [id number] DOB: 1951/06/14 Today's Date: 08/21/2023 Time: 4132-4401 SLP Time Calculation (min) (ACUTE ONLY): 16 min  Assessment / Plan / Recommendation Clinical Impression  Pt currently on puree and nectar thick liquids following swallow assessment yesterday. Today seen for dysphagia intervention with family at bedside. Family was able to provide information re: prior diet at SNF and reported he was on a puree diet and nectar thick liquids. Pt able to assist holding cup sips with nectar thick liquids and straw. Needed extra effort at times to extract liquids and impulsive wanting to take continuous sips with therapist having to remove straw after 1-2 sips. Mildly prolonged oral transit to posterior oral cavity. Family reported he did cough recently when took a large sip. There was no coughing or throat clearing and one episode of mildly wet vocal quality that cleared when asked to produce throat clear. Several sub swallows were noted as pt had pharyngeal residue noted on most recent FEES. Pt appears to be tolerating prior diet of nectar and puree and family agrees that an MBS would likely not provide additional information to change the course of pt's treatment plan. Recommend he continue puree, nectar, crush meds with full assist for feeding and to control sip size. ST will sign off at this time.    HPI HPI: Calvin [name] is a 72 y.o. male with medical history significant of seizure disorder, CVA with residual left-sided hemiparesis, chronic HFpEF, CKD stage II, HTN, colon cancer status post transverse colectomy and colostomy, sent from nursing home for evaluation of altered mentations. Per MD notes pt is postictal and confused. CXR No active disease. Mild cardiomegaly.  FEES 07/20/23 penetrated liquids to glottis, absent epiglottic deflection, so PES opening with backflow, senses and ejects penetrate and aspirate  and significant pharyngeal residue and harder to clear than thin liqiuds, cleared with liquid wash, recommended full liquids per surgery and advance to puree when clear and pt is expected to cough a great deal.      SLP Plan  All goals met;Discharge SLP treatment due to (comment)      Recommendations for follow up therapy are one component of a multi-disciplinary discharge planning process, led by the attending physician.  Recommendations may be updated based on patient status, additional functional criteria and insurance authorization.    Recommendations  Diet recommendations: Dysphagia 1 (puree);Nectar-thick liquid Liquids provided via: Straw;Cup Medication Administration: Crushed with puree Supervision: Staff to assist with self feeding;Full supervision/cueing for compensatory strategies Compensations: Slow rate;Small sips/bites;Multiple dry swallows after each bite/sip Postural Changes and/or Swallow Maneuvers: Seated upright 90 degrees                  Oral care BID   Frequent or constant Supervision/Assistance Dysphagia, unspecified (R13.10)     All goals met;Discharge SLP treatment due to (comment)     Royce Macadamia  08/21/2023, 1:19 PM

## 2023-08-21 NOTE — Progress Notes (Signed)
Subjective: No acute events overnight.  ROS: negative except above Examination  Vital signs in last 24 hours: Temp:  [97.5 F (36.4 C)-98.3 F (36.8 C)] 98.3 F (36.8 C) (12/17 0400) Pulse Rate:  [80-180] 80 (12/17 0400) Resp:  [16-26] 17 (12/17 0400) BP: (109-121)/(65-85) 109/67 (12/17 0400) SpO2:  [95 %-100 %] 96 % (12/17 0400)  General: lying in bed, NAD Neuro: MS: Alert, oriented to self, has some dysarthria so unable to understand where he stated location is, month: November, follows commands, has trouble naming objects CN: pupils equal and reactive,  EOMI, face symmetric, tongue midline, normal sensation over face, left hemianopia Motor: 4/5 in bilateral upper extremities, 3/5 in bilateral lower extremities Coordination: normal Gait: not tested  Basic Metabolic Panel: Recent Labs  Lab 08/18/23 1115 08/20/23 0451 08/20/23 0726 08/21/23 0757  NA 141 141  --  140  K 5.1 5.1  --  5.4*  CL 106 109  --  109  CO2 26 20*  --  20*  GLUCOSE 97 72  --  82  BUN 33* 14  --  17  CREATININE 1.86* 1.51*  --  1.28*  CALCIUM 9.1 9.1  --  8.7*  MG  --   --  1.9 1.9  PHOS  --   --  2.7 4.0    CBC: Recent Labs  Lab 08/18/23 1115 08/20/23 0451 08/21/23 0522  WBC 9.0 6.5 8.2  NEUTROABS 6.8  --  5.7  HGB 12.1* 12.3* 12.6*  HCT 40.5 39.6 40.4  MCV 97.8 95.0 94.4  PLT 117* 99* 73*     Coagulation Studies: No results for input(s): "LABPROT", "INR" in the last 72 hours.  Imaging No new brain imaging overnight    ASSESSMENT AND PLAN: 72 year old male with prior stroke, epilepsy admitted with altered mental status and concern for breakthrough seizure.   Epilepsy with breakthrough seizure Action tremor -Reportedly patient had seizure-like activity at nursing facility.  On arrival to hospital, was evaluated by Dr. Wilford Corner and noted to have some left eye twitching.  -No seizures noted overnight on video EEG -Action tremor in both upper extremities which could have been  confused for seizures.  This could also be a side effect of Depakote -Acute encephalopathy could be secondary to medications   Recommendations -Ordered a Depakote taper with plan to stop -Continue lamotrigine  50mg  BID, Pregabalin 50mg  QAM and 150mg  QHS -Transition Keppra to p.o. 1000 mg twice daily -DC LTM EEG as no seizures -Rescue medication at the time of discharge: Intranasal Valtoco 20 mg for seizure lasting more than 2 minutes -Continue seizure precautions -as needed IV ativan for clinical seizures -Management of rest of comorbidities per primary team -Discussed plan with Dr. Thedore Mins via secure chat   I have spent a total of   36 minutes with the patient reviewing hospital notes,  test results, labs and examining the patient as well as establishing an assessment and plan. > 50% of time was spent in direct patient care.      Lindie Spruce Epilepsy [facility] For questions after 5pm please refer to AMION to reach the Neurologist on call

## 2023-08-21 NOTE — Procedures (Addendum)
[name]  MRN: [id number]  Epilepsy Attending: Charlsie Quest  Referring Physician/Provider: Milon Dikes, MD  Duration: 08/20/2023 1415 to 08/21/2023 0908   Patient history:  72 y.o. 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 EEG to evaluate for seizure   Level of alertness: Awake   AEDs during EEG study: LTG, PGB, LEV, VPA   Technical aspects: This EEG study was done with scalp electrodes positioned according to the 10-20 International system of electrode placement. Electrical activity was reviewed with band pass filter of 1-70Hz , sensitivity of 7 uV/mm, display speed of 45mm/sec with a 60Hz  notched filter applied as appropriate. EEG data were recorded continuously and digitally stored.  Video monitoring was available and reviewed as appropriate.   Description:  The posterior dominant rhythm consists of 6 Hz activity of moderate voltage (25-35 uV) seen predominantly in posterior head regions, symmetric and reactive to eye opening and eye closing. Sleep was characterized by vertex waves, sleep spindles (12 to 14 Hz), maximal frontocentral region. EEG showed continuous generalized predominantly 5 to 7 Hz theta slowing admixed with intermittent generalized 2 to 3 Hz delta slowing. Hyperventilation and photic stimulation were not performed.    ABNORMALITY - Continuous slow, generalized - Background slow   IMPRESSION: This study is suggestive of moderate diffuse encephalopathy. No definite seizures were seen throughout the recording.   Deanndra Kirley Annabelle Harman

## 2023-08-22 ENCOUNTER — Other Ambulatory Visit (HOSPITAL_COMMUNITY): Payer: Self-pay

## 2023-08-22 DIAGNOSIS — R569 Unspecified convulsions: Secondary | ICD-10-CM | POA: Diagnosis not present

## 2023-08-22 LAB — CBC WITH DIFFERENTIAL/PLATELET
Abs Immature Granulocytes: 0.1 10*3/uL — ABNORMAL HIGH (ref 0.00–0.07)
Basophils Absolute: 0 10*3/uL (ref 0.0–0.1)
Basophils Relative: 0 %
Eosinophils Absolute: 0 10*3/uL (ref 0.0–0.5)
Eosinophils Relative: 0 %
HCT: 35.9 % — ABNORMAL LOW (ref 39.0–52.0)
Hemoglobin: 11.1 g/dL — ABNORMAL LOW (ref 13.0–17.0)
Immature Granulocytes: 2 %
Lymphocytes Relative: 22 %
Lymphs Abs: 1.2 10*3/uL (ref 0.7–4.0)
MCH: 29.5 pg (ref 26.0–34.0)
MCHC: 30.9 g/dL (ref 30.0–36.0)
MCV: 95.5 fL (ref 80.0–100.0)
Monocytes Absolute: 0.5 10*3/uL (ref 0.1–1.0)
Monocytes Relative: 10 %
Neutro Abs: 3.6 10*3/uL (ref 1.7–7.7)
Neutrophils Relative %: 66 %
Platelets: 100 10*3/uL — ABNORMAL LOW (ref 150–400)
RBC: 3.76 MIL/uL — ABNORMAL LOW (ref 4.22–5.81)
RDW: 13.8 % (ref 11.5–15.5)
WBC: 5.5 10*3/uL (ref 4.0–10.5)
nRBC: 0 % (ref 0.0–0.2)

## 2023-08-22 LAB — COMPREHENSIVE METABOLIC PANEL
ALT: 8 U/L (ref 0–44)
AST: 11 U/L — ABNORMAL LOW (ref 15–41)
Albumin: 2.6 g/dL — ABNORMAL LOW (ref 3.5–5.0)
Alkaline Phosphatase: 32 U/L — ABNORMAL LOW (ref 38–126)
Anion gap: 9 (ref 5–15)
BUN: 17 mg/dL (ref 8–23)
CO2: 22 mmol/L (ref 22–32)
Calcium: 8.4 mg/dL — ABNORMAL LOW (ref 8.9–10.3)
Chloride: 111 mmol/L (ref 98–111)
Creatinine, Ser: 1.12 mg/dL (ref 0.61–1.24)
GFR, Estimated: >60 mL/min (ref >60)
Glucose, Bld: 89 mg/dL (ref 70–99)
Potassium: 4.7 mmol/L (ref 3.5–5.1)
Sodium: 142 mmol/L (ref 135–145)
Total Bilirubin: 0.5 mg/dL (ref <1.2)
Total Protein: 5.9 g/dL — ABNORMAL LOW (ref 6.5–8.1)

## 2023-08-22 LAB — PHOSPHORUS: Phosphorus: 4.1 mg/dL (ref 2.5–4.6)

## 2023-08-22 LAB — BRAIN NATRIURETIC PEPTIDE: B Natriuretic Peptide: 63 pg/mL (ref 0.0–100.0)

## 2023-08-22 LAB — MAGNESIUM: Magnesium: 1.9 mg/dL (ref 1.7–2.4)

## 2023-08-22 LAB — AMMONIA: Ammonia: 34 umol/L (ref 9–35)

## 2023-08-22 MED ORDER — VALTOCO 20 MG DOSE 10 MG/0.1ML NA LQPK
20.0000 mg | NASAL | 2 refills | Status: DC | PRN
  Filled 2023-08-22: qty 10, 30d supply, fill #0

## 2023-08-22 MED ORDER — LACTULOSE 10 GM/15ML PO SOLN
30.0000 g | Freq: Three times a day (TID) | ORAL | Status: AC
  Administered 2023-08-22 (×2): 30 g via ORAL
  Filled 2023-08-22 (×2): qty 60

## 2023-08-22 MED ORDER — SODIUM ZIRCONIUM CYCLOSILICATE 10 G PO PACK
10.0000 g | PACK | Freq: Two times a day (BID) | ORAL | Status: DC
  Administered 2023-08-22: 10 g via ORAL
  Filled 2023-08-22: qty 1

## 2023-08-22 NOTE — Plan of Care (Signed)

## 2023-08-22 NOTE — Progress Notes (Signed)
Subjective: No acute events overnight.  Continuing to improve.  ROS: negative except above  Examination  Vital signs in last 24 hours: Temp:  [97.6 F (36.4 C)-98.2 F (36.8 C)] 97.6 F (36.4 C) (12/18 0820) Pulse Rate:  [78-85] 82 (12/18 0820) Resp:  [14-24] 15 (12/18 0820) BP: (106-125)/(61-81) 111/71 (12/18 0820) SpO2:  [91 %-95 %] 93 % (12/18 0820)  General: lying in bed, NAD Neuro: MS: Aox3, follows commands, has trouble naming objects CN: pupils equal and reactive,  EOMI, face symmetric, tongue midline, normal sensation over face, left hemianopia Motor: 4/5 in bilateral upper extremities, 3/5 in bilateral lower extremities Coordination: normal Gait: not tested  Basic Metabolic Panel: Recent Labs  Lab 08/18/23 1115 08/20/23 0451 08/20/23 0726 08/21/23 0757 08/22/23 0424  NA 141 141  --  140 142  K 5.1 5.1  --  5.4* 4.7  CL 106 109  --  109 111  CO2 26 20*  --  20* 22  GLUCOSE 97 72  --  82 89  BUN 33* 14  --  17 17  CREATININE 1.86* 1.51*  --  1.28* 1.12  CALCIUM 9.1 9.1  --  8.7* 8.4*  MG  --   --  1.9 1.9 1.9  PHOS  --   --  2.7 4.0 4.1    CBC: Recent Labs  Lab 08/18/23 1115 08/20/23 0451 08/21/23 0522 08/22/23 0424  WBC 9.0 6.5 8.2 5.5  NEUTROABS 6.8  --  5.7 3.6  HGB 12.1* 12.3* 12.6* 11.1*  HCT 40.5 39.6 40.4 35.9*  MCV 97.8 95.0 94.4 95.5  PLT 117* 99* 73* 100*     Coagulation Studies: No results for input(s): "LABPROT", "INR" in the last 72 hours.  Imaging No new brain imaging overnight  ASSESSMENT AND PLAN:  72 year old male with prior stroke, epilepsy admitted with altered mental status and concern for breakthrough seizure.   Epilepsy with breakthrough seizure Action tremor -Reportedly patient had seizure-like activity at nursing facility.  On arrival to hospital, was evaluated by Dr. Wilford Corner and noted to have some left eye twitching.  -No seizures noted overnight on video EEG -Action tremor in both upper extremities which could have  been confused for seizures.  This could also be a side effect of Depakote -Acute encephalopathy could be secondary to medications   Recommendations -On Depakote taper with plan to stop, last dose on 09/03/2023 -Continue lamotrigine  50mg  BID, Pregabalin 50mg  QAM and 150mg  at bedtime, Keppra to p.o. 1000 mg twice daily -If patient remains seizure-free, can consider weaning lamotrigine. -Rescue medication at the time of discharge: Intranasal Valtoco 20 mg for seizure lasting more than 2 minutes -Continue seizure precautions -as needed IV ativan for clinical seizures -Management of rest of comorbidities per primary team -Discussed plan with Dr. Thedore Mins via secure chat -Follow-up with neurology in 3 months at [facility] clinic -Neurology will sign off.  Please call us back for any further questions  Seizure precautions: Per [facility] statutes, patients with seizures are not allowed to drive until they have been seizure-free for six months and cleared by a physician    Use caution when using heavy equipment or power tools. Avoid working on ladders or at heights. Take showers instead of baths. Ensure the water temperature is not too high on the home water heater. Do not go swimming alone. Do not lock yourself in a room alone (i.e. bathroom). When caring for infants or small children, sit down when holding, feeding, or changing them  to minimize risk of injury to the child in the event you have a seizure. Maintain good sleep hygiene. Avoid alcohol.    If patient has another seizure, call 911 and bring them back to the ED if: A.  The seizure lasts longer than 5 minutes.      B.  The patient doesn't wake shortly after the seizure or has new problems such as difficulty seeing, speaking or moving following the seizure C.  The patient was injured during the seizure D.  The patient has a temperature over 102 F (39C) E.  The patient vomited during the seizure and now is having trouble breathing     During the Seizure   - First, ensure adequate ventilation and place patients on the floor on their left side  Loosen clothing around the neck and ensure the airway is patent. If the patient is clenching the teeth, do not force the mouth open with any object as this can cause severe damage - Remove all items from the surrounding that can be hazardous. The patient may be oblivious to what's happening and may not even know what he or she is doing. If the patient is confused and wandering, either gently guide him/her away and block access to outside areas - Reassure the individual and be comforting - Call 911. In most cases, the seizure ends before EMS arrives. However, there are cases when seizures may last over 3 to 5 minutes. Or the individual may have developed breathing difficulties or severe injuries. If a pregnant patient or a person with diabetes develops a seizure, it is prudent to call an ambulance.     After the Seizure (Postictal Stage)   After a seizure, most patients experience confusion, fatigue, muscle pain and/or a headache. Thus, one should permit the individual to sleep. For the next few days, reassurance is essential. Being calm and helping reorient the person is also of importance.   Most seizures are painless and end spontaneously. Seizures are not harmful to others but can lead to complications such as stress on the lungs, brain and the heart. Individuals with prior lung problems may develop labored breathing and respiratory distress.    I have spent a total of   36 minutes with the patient reviewing hospital notes,  test results, labs and examining the patient as well as establishing an assessment and plan. > 50% of time was spent in direct patient care.    Lindie Spruce Epilepsy [facility] For questions after 5pm please refer to AMION to reach the Neurologist on call

## 2023-08-22 NOTE — TOC Progression Note (Addendum)
Transition of Care [facility]) - Progression Note    Patient Details  Name: Calvin [name] MRN: [id number] Date of Birth: 1951-09-02  [facility]) CM/SW Contact  Erin Sons, [place] Phone Number: 08/22/2023, 1:23 PM  Clinical Narrative:     CSW updated [facility] admissions of anticipated DC tomorrow. CSW inquired about filling Valtoco script at hospital prior to DC; admissions transferred CSW to [place] copy. CSW left voicemail requesting return call.  1400: Spoke with DON who confirmed [facility] to fill Valtoco script and send it with pt. CSW notified pharmacy.   Expected Discharge Plan: Skilled Nursing Facility Barriers to Discharge: Continued Medical Work up  Expected Discharge Plan and Services In-house Referral: Clinical Social Work   Post Acute Care Choice: Skilled Nursing Facility Living arrangements for the past 2 months: Skilled Nursing Facility                                       Social Determinants of Health (SDOH) Interventions SDOH Screenings   Food Insecurity: Patient Unable To Answer (08/20/2023)  Housing: Patient Unable To Answer (08/20/2023)  Transportation Needs: Patient Unable To Answer (08/20/2023)  Utilities: Patient Unable To Answer (08/20/2023)  Tobacco Use: Medium Risk (08/18/2023)    Readmission Risk Interventions    08/20/2023   12:32 PM 07/17/2023   12:24 PM 06/30/2022    2:25 PM  Readmission Risk Prevention Plan  Transportation Screening Complete Complete Complete  PCP or Specialist Appt within 3-5 Days  Complete   Social Work Consult for Recovery Care Planning/Counseling  Complete   Palliative Care Screening  Not Applicable   Medication Review Oceanographer) Complete Complete   PCP or Specialist appointment within 3-5 days of discharge Complete    HRI or Home Care Consult Complete  Complete  SW Recovery Care/Counseling Consult Complete  Complete  Palliative Care Screening Not Applicable  Complete  Skilled  Nursing Facility Complete  Complete

## 2023-08-22 NOTE — Progress Notes (Signed)
PROGRESS NOTE                                                                                                                                                                                                             Patient Demographics:    Calvin [name], is a 72 y.o. male, DOB - [birth year], WGN:[id number]  Outpatient Primary MD for the patient is Keane Police, MD    LOS - 3  Admit date - 08/19/2023    No chief complaint on file.      Brief Narrative (HPI from H&P)    72 y.o. male with medical history significant of seizure disorder, CVA, heart failure with preserved ejection fraction, CKD stage II, hypertension, colon cancer status post colectomy who presented to the emergency department from a nursing home with altered mental status.    Patient was confused and unable to provide history at [facility].  At that time family reported patient's had frequent episodes of confusion and worsening failure to thrive.  He was afebrile and hemodynamically stable, urinalysis negative for UTI MR head was obtained which showed no acute processes with unchanged old infarcts.  Neurology was consulted and recommended transfer to [facility] for EEG.  Patient was kept on IV Keppra and IV Depakote.    Subjective:   Patient in bed, appears comfortable, denies any headache, no fever, no chest pain or pressure, no shortness of breath , no abdominal pain. No focal weakness.   Assessment  & Plan :   Acute metabolic encephalopathy in a patient with history of stroke, remote history of seizure alcohol induced according to the family.  Seen by neurology at [facility], MRI brain nonacute but does confirm old strokes, EEG here does not show any ongoing seizures, and by neurologist here seizure medications adjusted, looks like Depakote dose has been dropped, continue present medications and monitor.  Still quite weak  according to family not at his baseline, continue PT OT and speech evaluation.  Ammonia levels higher than baseline increase lactulose, clinically much improved on 08/22/2023.  # BPH-continue Flomax he is able to take oral medications.  AKI on CKD stage IIIa -patient's creatinine was elevated at 1.8.  Patient was placed on IV fluids with improvement.  Monitor closely.   Hypertension -as needed IV hydralazine   History of CVA -continue aspirin  Muscle spasms -continue baclofen   Hyperkalemia.  Lokelma.      Condition -  Guarded  Family Communication  :    sisters Marylu Lund [phone]  on 08/20/23  Loletta Parish [phone]  on 08/21/2023  Daughter on patient's room phone on 08/21/2023.  Code Status :  Full  Consults  :  Neuro  PUD Prophylaxis :  PPI   Procedures  :     MRI -  1. No acute intracranial process. 2. Unchanged right-greater-than-left anterior frontal encephalomalacia, likely sequela of prior trauma. 3. Unchanged old infarcts in the right frontal operculum and right middle frontal gyrus. 4. Unchanged old infarcts in the right PCA territory and left cerebellar hemisphere.  EEG -  This study is suggestive of moderate diffuse encephalopathy. No definite seizures were seen throughout the recording.       Disposition Plan  :    Status is: Inpatient  DVT Prophylaxis  :    enoxaparin (LOVENOX) injection 40 mg Start: 08/19/23 2200 SCDs Start: 08/19/23 1312    Lab Results  Component Value Date   PLT 100 (L) 08/22/2023    Diet :  Diet Order             DIET - DYS 1 Room service appropriate? No; Fluid consistency: Nectar Thick  Diet effective now                    Inpatient Medications  Scheduled Meds:  aspirin  81 mg Oral Daily   baclofen  5 mg Oral BID   divalproex  500 mg Oral BID   Followed by   Melene Muller ON 08/25/2023] divalproex  500 mg Oral Daily   Followed by   Melene Muller ON 08/28/2023] divalproex  250 mg Oral Daily   Followed by    Melene Muller ON 08/31/2023] divalproex  125 mg Oral Daily   enoxaparin (LOVENOX) injection  40 mg Subcutaneous Q24H   lactulose  30 g Oral TID   lamoTRIgine  50 mg Oral BID   levETIRAcetam  1,000 mg Oral BID   pantoprazole  40 mg Oral Daily   polyethylene glycol  17 g Oral BID   pregabalin  100 mg Oral QHS   pregabalin  50 mg Oral BID   sodium zirconium cyclosilicate  10 g Oral BID   tamsulosin  0.4 mg Oral QPM   Continuous Infusions:   PRN Meds:.acetaminophen **OR** acetaminophen, diclofenac Sodium, hydrALAZINE, LORazepam, ondansetron **OR** ondansetron (ZOFRAN) IV  Antibiotics  :    Anti-infectives (From admission, onward)    None         Objective:   Vitals:   08/21/23 2000 08/21/23 2334 08/22/23 0000 08/22/23 0457  BP: 122/72 114/76 109/66 106/64  Pulse: 82 84 85 79  Resp: (!) 21 18 15 14   Temp:  98 F (36.7 C)    TempSrc:  Oral  Oral  SpO2: 91% 92% 92% 94%    Wt Readings from Last 3 Encounters:  08/18/23 90.7 kg  07/19/23 90 kg  07/15/23 90 kg     Intake/Output Summary (Last 24 hours) at 08/22/2023 0744 Last data filed at 08/22/2023 2956 Gross per 24 hour  Intake 120 ml  Output 650 ml  Net -530 ml     Physical Exam  Much more awake and alert this morning, no new F.N deficits, Normal affect [place].AT,PERRAL Supple Neck, No JVD,   Symmetrical Chest wall movement, Good air movement bilaterally, CTAB RRR,No Gallops,Rubs or new Murmurs,  +ve B.Sounds,  Abd Soft, No tenderness,   No Cyanosis, Clubbing or edema       Data Review:    Recent Labs  Lab 08/18/23 1115 08/20/23 0451 08/21/23 0522 08/22/23 0424  WBC 9.0 6.5 8.2 5.5  HGB 12.1* 12.3* 12.6* 11.1*  HCT 40.5 39.6 40.4 35.9*  PLT 117* 99* 73* 100*  MCV 97.8 95.0 94.4 95.5  MCH 29.2 29.5 29.4 29.5  MCHC 29.9* 31.1 31.2 30.9  RDW 13.7 14.0 13.9 13.8  LYMPHSABS 1.1  --  1.6 1.2  MONOABS 0.9  --  0.8 0.5  EOSABS 0.0  --  0.0 0.0  BASOSABS 0.0  --  0.0 0.0    Recent Labs  Lab 08/18/23 1115  08/20/23 0451 08/20/23 0726 08/21/23 0522 08/21/23 0757 08/22/23 0424  NA 141 141  --   --  140 142  K 5.1 5.1  --   --  5.4* 4.7  CL 106 109  --   --  109 111  CO2 26 20*  --   --  20* 22  ANIONGAP 9 12  --   --  11 9  GLUCOSE 97 72  --   --  82 89  BUN 33* 14  --   --  17 17  CREATININE 1.86* 1.51*  --   --  1.28* 1.12  AST 13*  --   --   --  19 11*  ALT 12  --   --   --  12 8  ALKPHOS 40  --   --   --  32* 32*  BILITOT <0.2  --   --   --  0.8 0.5  ALBUMIN 3.5  --   --   --  2.7* 2.6*  LATICACIDVEN 1.0  --   --   --   --   --   AMMONIA  --   --  26  --  84*  --   BNP  --   --   --  112.4*  --  63.0  MG  --   --  1.9  --  1.9 1.9  CALCIUM 9.1 9.1  --   --  8.7* 8.4*      Recent Labs  Lab 08/18/23 1115 08/20/23 0451 08/20/23 0726 08/21/23 0522 08/21/23 0757 08/22/23 0424  LATICACIDVEN 1.0  --   --   --   --   --   AMMONIA  --   --  26  --  84*  --   BNP  --   --   --  112.4*  --  63.0  MG  --   --  1.9  --  1.9 1.9  CALCIUM 9.1 9.1  --   --  8.7* 8.4*    --------------------------------------------------------------------------------------------------------------- Lab Results  Component Value Date   CHOL 117 05/29/2022   HDL 48 05/29/2022   LDLCALC 57 05/29/2022   TRIG 66 06/29/2022   CHOLHDL 2.4 05/29/2022    Lab Results  Component Value Date   HGBA1C 5.8 (H) 05/28/2022    Radiology Reports Overnight EEG with video Result Date: 08/20/2023 Charlsie Quest, MD     08/21/2023  8:54 AM [name] MRN: [id number] Epilepsy Attending: Charlsie Quest Referring Physician/Provider: Milon Dikes, MD Duration: 08/19/2023 1415 to 08/20/2023 1415 Patient history:  72 y.o. 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 EEG to evaluate for seizure Level of alertness: Awake AEDs during EEG study: LTG, PGB, LEV, VPA Technical aspects: This EEG study was done with scalp electrodes positioned according to the 10-20 International system of electrode placement. Electrical activity was reviewed with band pass filter of 1-70Hz , sensitivity of 7 uV/mm, display speed of 7mm/sec with a 60Hz  notched filter applied as appropriate. EEG data were recorded continuously and digitally stored.  Video monitoring was available and reviewed as appropriate. Description: No clear posterior dominant rhythm was seen. EEG showed continuous generalized 3 to 6 Hz theta-delta slowing. Intermittently, rhythmic 4-5 Hz theta slowing was noted in left and right occipital region without definite evolution, lasting about  1 minute. Clinically at times patient had right arm tremor and at times no clinical signs were seen ( lights were off and patient was mostly covered under blanket so difficult to see). Hyperventilation and photic stimulation were not performed.   ABNORMALITY - Continuous slow, generalized IMPRESSION: This study is suggestive of moderate diffuse encephalopathy. No definite seizures were seen throughout the recording. Charlsie Quest   MR BRAIN WO CONTRAST Result Date: 08/18/2023 CLINICAL DATA:  Neuro deficit, acute, stroke suspected. Altered mental status. EXAM: MRI HEAD WITHOUT CONTRAST TECHNIQUE: Multiplanar, multiecho pulse sequences of the brain and surrounding structures were obtained without intravenous contrast. COMPARISON:  MRI brain 07/18/2023.  Head CT 08/18/2023. FINDINGS: Brain: No acute infarct or hemorrhage. Unchanged right-greater-than-left anterior frontal encephalomalacia, likely sequela of prior trauma. Unchanged encephalomalacia along the right frontal operculum and right middle frontal  gyrus, likely sequela of prior infarct. Unchanged old infarcts in the right PCA territory and left cerebellar hemisphere. No hydrocephalus or extra-axial collection. No mass or midline shift. Vascular: Normal flow voids. Skull and upper cervical spine: Normal marrow signal. Sinuses/Orbits: Negative. Other: None. IMPRESSION: 1. No acute intracranial process. 2. Unchanged right-greater-than-left anterior frontal encephalomalacia, likely sequela of prior trauma. 3. Unchanged old infarcts in the right frontal operculum and right middle frontal gyrus. 4. Unchanged old infarcts in the right PCA territory and left cerebellar hemisphere. Electronically Signed   By: Orvan Falconer M.D.   On: 08/18/2023 19:38   DG Chest 1 View Result Date: 08/18/2023 CLINICAL DATA:  Left arm shaking unresponsive EXAM: CHEST  1 VIEW COMPARISON:  07/28/2023 FINDINGS: Mild cardiomegaly. No acute airspace disease or pleural effusion. No pneumothorax. Suspected AVN type changes of left humeral head. IMPRESSION: No active disease. Mild cardiomegaly. Electronically Signed   By: Jasmine Pang M.D.   On: 08/18/2023 15:54   CT Head Wo Contrast Result Date: 08/18/2023 CLINICAL DATA:  Mental status change, unknown cause EXAM: CT HEAD WITHOUT CONTRAST TECHNIQUE: Contiguous axial images were obtained from the base of the skull through the vertex without intravenous contrast. RADIATION DOSE REDUCTION: This exam was performed according to the departmental dose-optimization program which includes automated exposure control, adjustment of the mA and/or kV according to patient size and/or use of iterative reconstruction technique. COMPARISON:  CT head July 17, 2023. MRI head July 18, 2023 FINDINGS: Brain: Similar multifocal encephalomalacia, including in the inferior right greater than left frontal lobes, more superior right frontal lobe, and right parieto-occipital region. Small remote left cerebellar infarct. No evidence of acute large  vascular territory infarct, acute hemorrhage, mass lesion, midline shift or hydrocephalus. Vascular: Calcific atherosclerosis. No hyperdense vessel identified.  Skull: No acute fracture. Sinuses/Orbits: Mostly clear sinuses.  No acute orbital findings. Other: Mastoid effusions. IMPRESSION: 1. No evidence of acute intracranial abnormality. 2. Similar multifocal encephalomalacia, detailed above. Electronically Signed   By: Feliberto Harts M.D.   On: 08/18/2023 12:22      Signature  -   Susa Raring M.D on 08/22/2023 at 7:44 AM   -  To page go to [email or web address]

## 2023-08-22 NOTE — Care Management Important Message (Signed)
Important Message  Patient Details  Name: Calvin [name] MRN: [id number] Date of Birth: [birth year]   Important Message Given:  Yes - Medicare IM     Dorena Bodo 08/22/2023, 3:20 PM

## 2023-08-23 ENCOUNTER — Other Ambulatory Visit (HOSPITAL_COMMUNITY): Payer: Self-pay

## 2023-08-23 DIAGNOSIS — R569 Unspecified convulsions: Secondary | ICD-10-CM | POA: Diagnosis not present

## 2023-08-23 LAB — PHOSPHORUS: Phosphorus: 3.3 mg/dL (ref 2.5–4.6)

## 2023-08-23 LAB — COMPREHENSIVE METABOLIC PANEL
ALT: 8 U/L (ref 0–44)
AST: 11 U/L — ABNORMAL LOW (ref 15–41)
Albumin: 2.7 g/dL — ABNORMAL LOW (ref 3.5–5.0)
Alkaline Phosphatase: 35 U/L — ABNORMAL LOW (ref 38–126)
Anion gap: 6 (ref 5–15)
BUN: 11 mg/dL (ref 8–23)
CO2: 26 mmol/L (ref 22–32)
Calcium: 8.7 mg/dL — ABNORMAL LOW (ref 8.9–10.3)
Chloride: 111 mmol/L (ref 98–111)
Creatinine, Ser: 1.16 mg/dL (ref 0.61–1.24)
GFR, Estimated: >60 mL/min (ref >60)
Glucose, Bld: 110 mg/dL — ABNORMAL HIGH (ref 70–99)
Potassium: 4 mmol/L (ref 3.5–5.1)
Sodium: 143 mmol/L (ref 135–145)
Total Bilirubin: 0.4 mg/dL (ref <1.2)
Total Protein: 6.1 g/dL — ABNORMAL LOW (ref 6.5–8.1)

## 2023-08-23 LAB — CBC WITH DIFFERENTIAL/PLATELET
Abs Immature Granulocytes: 0.14 10*3/uL — ABNORMAL HIGH (ref 0.00–0.07)
Basophils Absolute: 0 10*3/uL (ref 0.0–0.1)
Basophils Relative: 1 %
Eosinophils Absolute: 0.1 10*3/uL (ref 0.0–0.5)
Eosinophils Relative: 1 %
HCT: 37.8 % — ABNORMAL LOW (ref 39.0–52.0)
Hemoglobin: 11.6 g/dL — ABNORMAL LOW (ref 13.0–17.0)
Immature Granulocytes: 3 %
Lymphocytes Relative: 24 %
Lymphs Abs: 1.2 10*3/uL (ref 0.7–4.0)
MCH: 29.1 pg (ref 26.0–34.0)
MCHC: 30.7 g/dL (ref 30.0–36.0)
MCV: 94.7 fL (ref 80.0–100.0)
Monocytes Absolute: 0.5 10*3/uL (ref 0.1–1.0)
Monocytes Relative: 10 %
Neutro Abs: 3 10*3/uL (ref 1.7–7.7)
Neutrophils Relative %: 61 %
Platelets: 92 10*3/uL — ABNORMAL LOW (ref 150–400)
RBC: 3.99 MIL/uL — ABNORMAL LOW (ref 4.22–5.81)
RDW: 13.9 % (ref 11.5–15.5)
WBC: 4.9 10*3/uL (ref 4.0–10.5)
nRBC: 0 % (ref 0.0–0.2)

## 2023-08-23 LAB — MAGNESIUM: Magnesium: 2 mg/dL (ref 1.7–2.4)

## 2023-08-23 LAB — AMMONIA: Ammonia: 24 umol/L (ref 9–35)

## 2023-08-23 LAB — BRAIN NATRIURETIC PEPTIDE: B Natriuretic Peptide: 38.8 pg/mL (ref 0.0–100.0)

## 2023-08-23 MED ORDER — PREGABALIN 50 MG PO CAPS: ORAL_CAPSULE | ORAL | 0 refills | Status: DC

## 2023-08-23 MED ORDER — PANTOPRAZOLE SODIUM 40 MG PO TBEC: 40.0000 mg | DELAYED_RELEASE_TABLET | Freq: Every day | ORAL | Status: DC

## 2023-08-23 MED ORDER — VALTOCO 20 MG DOSE 10 MG/0.1ML NA LQPK: 20.0000 mg | NASAL | 2 refills | Status: DC | PRN

## 2023-08-23 MED ORDER — LEVETIRACETAM 1000 MG PO TABS: 1000.0000 mg | ORAL_TABLET | Freq: Two times a day (BID) | ORAL | Status: DC

## 2023-08-23 MED ORDER — DIVALPROEX SODIUM 125 MG PO DR TAB: DELAYED_RELEASE_TABLET | ORAL | Status: AC

## 2023-08-23 NOTE — TOC Transition Note (Signed)
Transition of Care [facility]) - Discharge Note   Patient Details  Name: Calvin [name] MRN: [id number] Date of Birth: 1951/08/22  Transition of Care [facility]) CM/SW Contact:  Erin Sons, LCSW Phone Number: 08/23/2023, 11:32 AM   Clinical Narrative:     Per MD patient ready for DC to [facility]. RN, patient, patient's family, and facility notified of DC. Discharge Summary and FL2 sent to facility. RN to call report prior to discharge [phone]). DC packet on chart. Ambulance transport requested for patient.   CSW will sign off for now as social work intervention is no longer needed. Please consult [place] again if new needs arise.   Final next level of care: Long Term Nursing Home Barriers to Discharge: No Barriers Identified   Patient Goals and CMS Choice Patient states their goals for this hospitalization and ongoing recovery are:: Return to SNF          Discharge Placement              Patient chooses bed at:  [facility]) Patient to be transferred to facility by: [facility] Name of family member notified: Georgina Quint Patient and family notified of of transfer: 08/23/23  Discharge Plan and Services Additional resources added to the After Visit Summary for   In-house Referral: Clinical Social Work   Post Acute Care Choice: Skilled Nursing Facility                               Social Drivers of Health (SDOH) Interventions SDOH Screenings   Food Insecurity: Patient Unable To Answer (08/20/2023)  Housing: Patient Unable To Answer (08/20/2023)  Transportation Needs: Patient Unable To Answer (08/20/2023)  Utilities: Patient Unable To Answer (08/20/2023)  Tobacco Use: Medium Risk (08/18/2023)     Readmission Risk Interventions    08/20/2023   12:32 PM 07/17/2023   12:24 PM 06/30/2022    2:25 PM  Readmission Risk Prevention Plan  Transportation Screening Complete Complete Complete  PCP or Specialist Appt within 3-5 Days  Complete    Social Work Consult for Recovery Care Planning/Counseling  Complete   Palliative Care Screening  Not Applicable   Medication Review Oceanographer) Complete Complete   PCP or Specialist appointment within 3-5 days of discharge Complete    HRI or Home Care Consult Complete  Complete  SW Recovery Care/Counseling Consult Complete  Complete  Palliative Care Screening Not Applicable  Complete  Skilled Nursing Facility Complete  Complete

## 2023-08-23 NOTE — Plan of Care (Signed)

## 2023-08-23 NOTE — Discharge Instructions (Signed)
Do not drive, operate heavy machinery, perform activities at heights, swimming or participation in water activities or provide baby sitting services until you have seen by Primary MD or a Neurologist and advised to do so again.  Follow with Primary MD Keane Police, MD in 7 days   Get CBC, CMP, 2 view Chest X ray -  checked next visit with your primary MD or SNF MD    Activity: As tolerated with Full fall precautions use walker/cane & assistance as needed  Disposition SNF  Diet: Dysphagia 1 diet, nectar thick liquids with feeding assistance and aspiration precautions.  Special Instructions: If you have smoked or chewed Tobacco  in the last 2 yrs please stop smoking, stop any regular Alcohol  and or any Recreational drug use.  On your next visit with your primary care physician please Get Medicines reviewed and adjusted.  Please request your Prim.MD to go over all Hospital Tests and Procedure/Radiological results at the follow up, please get all Hospital records sent to your Prim MD by signing hospital release before you go home.  If you experience worsening of your admission symptoms, develop shortness of breath, life threatening emergency, suicidal or homicidal thoughts you must seek medical attention immediately by calling 911 or calling your MD immediately  if symptoms less severe.  You Must read complete instructions/literature along with all the possible adverse reactions/side effects for all the Medicines you take and that have been prescribed to you. Take any new Medicines after you have completely understood and accpet all the possible adverse reactions/side effects.   Do not drive when taking Pain medications.  Do not take more than prescribed Pain, Sleep and Anxiety Medications

## 2023-08-23 NOTE — Discharge Summary (Signed)
Calvin [name] ZOX:[id number] DOB: 07-Oct-1950 DOA: 08/19/2023  PCP: Keane Police, MD  Admit date: 08/19/2023  Discharge date: 08/23/2023  Admitted From: SNF   Disposition:  SNF   Recommendations for Outpatient Follow-up:   Follow up with PCP in 1-2 weeks  PCP Please obtain BMP/CBC, 2 view CXR in 1week,  (see Discharge instructions)   PCP Please follow up on the following pending results:    Home Health: None   Equipment/Devices: None  Consultations: Neuro Discharge Condition: Stable    CODE STATUS: Full    Diet Recommendation:   Diet Order             DIET - DYS 1 Room service appropriate? No; Fluid consistency: Nectar Thick  Diet effective now                    CC Seizure  Brief history of present illness from the day of admission and additional interim summary    72 y.o. male with medical history significant of seizure disorder, CVA, heart failure with preserved ejection fraction, CKD stage II, hypertension, colon cancer status post colectomy who presented to the emergency department from a nursing home with altered mental status.     Patient was confused and unable to provide history at [facility].  At that time family reported patient's had frequent episodes of confusion and worsening failure to thrive.  He was afebrile and hemodynamically stable, urinalysis negative for UTI MR head was obtained which showed no acute processes with unchanged old infarcts.  Neurology was consulted and recommended transfer to [facility] for EEG.  Patient was kept on IV Keppra and IV Depakote.                                                                    Hospital Course   Acute metabolic encephalopathy in a patient with history of stroke, remote history of seizure alcohol induced  according to the family.  Seen by neurology at [facility], MRI brain nonacute but does confirm old strokes, EEG here does not show any ongoing seizures, he was seen by neurologist here seizure medications adjusted as below, now back to baseline, seen by PT OT and speech, discharged back to SNF with outpatient neurology follow-up.  Per neurology clinical presentation was consistent with breakthrough seizure.   # BPH-continue Flomax   AKI on CKD stage IIIa -patient's creatinine was elevated at 1.8.  Patient was placed on IV fluids with improvement.  Monitor intermittently at SNF.Marland Kitchen   Hypertension -will off of medications continue to monitor.   History of CVA -continue aspirin   Muscle spasms -continue baclofen    Discharge diagnosis     Principal Problem:   Seizure [facility])    Discharge instructions  Discharge Instructions     Discharge instructions   Complete by: As directed    Do not drive, operate heavy machinery, perform activities at heights, swimming or participation in water activities or provide baby sitting services until you have seen by Primary MD or a Neurologist and advised to do so again.  Follow with Primary MD Keane Police, MD in 7 days   Get CBC, CMP, 2 view Chest X ray -  checked next visit with your primary MD or SNF MD    Activity: As tolerated with Full fall precautions use walker/cane & assistance as needed  Disposition SNF  Diet: Dysphagia 1 diet, nectar thick liquids with feeding assistance and aspiration precautions.  Special Instructions: If you have smoked or chewed Tobacco  in the last 2 yrs please stop smoking, stop any regular Alcohol  and or any Recreational drug use.  On your next visit with your primary care physician please Get Medicines reviewed and adjusted.  Please request your Prim.MD to go over all Hospital Tests and Procedure/Radiological results at the follow up, please get all Hospital records sent to your Prim MD by  signing hospital release before you go home.  If you experience worsening of your admission symptoms, develop shortness of breath, life threatening emergency, suicidal or homicidal thoughts you must seek medical attention immediately by calling 911 or calling your MD immediately  if symptoms less severe.  You Must read complete instructions/literature along with all the possible adverse reactions/side effects for all the Medicines you take and that have been prescribed to you. Take any new Medicines after you have completely understood and accpet all the possible adverse reactions/side effects.   Do not drive when taking Pain medications.  Do not take more than prescribed Pain, Sleep and Anxiety Medications   Increase activity slowly   Complete by: As directed        Discharge Medications   Allergies as of 08/23/2023   No Known Allergies      Medication List     STOP taking these medications    citalopram 10 MG tablet Commonly known as: CELEXA   enoxaparin 40 MG/0.4ML injection Commonly known as: LOVENOX   hydrALAZINE 20 MG/ML injection Commonly known as: APRESOLINE   levETIRAcetam 1000 MG/100ML Soln Commonly known as: KEPPRA   LORazepam 2 MG/ML injection Commonly known as: ATIVAN   ondansetron 4 MG/2ML Soln injection Commonly known as: ZOFRAN   pantoprazole 40 MG injection Commonly known as: PROTONIX Replaced by: pantoprazole 40 MG tablet   valproate 500 mg in dextrose 5 % 50 mL       TAKE these medications    aspirin 81 MG chewable tablet Take 81 mg by mouth daily.   baclofen 5 mg Tabs tablet Commonly known as: LIORESAL Take 5 mg by mouth 2 (two) times daily.   divalproex 125 MG DR tablet Commonly known as: DEPAKOTE Take 4 tablets (500 mg total) by mouth 2 (two) times daily for 1 day, THEN 4 tablets (500 mg total) daily for 3 days, THEN 2 tablets (250 mg total) daily for 3 days, THEN 1 tablet (125 mg total) daily for 3 days. Start taking on: August 23, 2023   fluticasone 50 MCG/ACT nasal spray Commonly known as: FLONASE Place 2 sprays into both nostrils daily.   lactulose 10 GM/15ML solution Commonly known as: CHRONULAC Take 20 g by mouth daily.   lamoTRIgine 25 MG tablet Commonly known as: LAMICTAL Take 50 mg by mouth 2 (two) times  daily.   levETIRAcetam 1000 MG tablet Commonly known as: KEPPRA Take 1 tablet (1,000 mg total) by mouth 2 (two) times daily.   Melatonin 10 MG Tabs Take 10 mg by mouth at bedtime.   mouth rinse Liqd solution 15 mLs by Mouth Rinse route every 2 (two) hours.   pantoprazole 40 MG tablet Commonly known as: PROTONIX Take 1 tablet (40 mg total) by mouth daily. Replaces: pantoprazole 40 MG injection   polyethylene glycol 17 g packet Commonly known as: MIRALAX / GLYCOLAX Take 17 g by mouth 2 (two) times daily.   pregabalin 50 MG capsule Commonly known as: LYRICA Take 50 mg capsule twice daily and 100 mg (two capsules) at bedtime   senna 8.6 MG Tabs tablet Commonly known as: SENOKOT Take 2 tablets (17.2 mg total) by mouth at bedtime as needed for mild constipation or moderate constipation.   sodium chloride 0.9 % infusion Inject 75 mLs into the vein continuous.   tamsulosin 0.4 MG Caps capsule Commonly known as: FLOMAX Take 0.4 mg by mouth every evening.   tiotropium 18 MCG inhalation capsule Commonly known as: SPIRIVA Place 18 mcg into inhaler and inhale daily.   Valtoco 20 MG Dose 10 MG/0.1ML Lqpk Generic drug: diazePAM (20 MG Dose) Place 20 mg into the nose as needed (seizure lasting over 2 minutes).         Follow-up Information     Keane Police, MD. Schedule an appointment as soon as possible for a visit in 1 week(s).   Specialty: Family Medicine Why: Neurologist of choice in 1 to 2 weeks for seizures Contact information: [address] [place] [address] [phone]         [facility]. Schedule an appointment as soon as  possible for a visit in 1 week(s).   Contact information: [address]     [address] [place] [address] [phone]                Major procedures and Radiology Reports - PLEASE review detailed and final reports thoroughly  -      Overnight EEG with video Result Date: 08/20/2023 Charlsie Quest, MD     08/21/2023  8:54 AM [name] MRN: [id number] Epilepsy Attending: Charlsie Quest Referring Physician/Provider: Milon Dikes, MD Duration: 08/19/2023 1415 to 08/20/2023 1415 Patient history:  72 y.o. 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 EEG to evaluate for seizure Level of alertness: Awake AEDs during EEG study: LTG, PGB, LEV, VPA Technical aspects: This EEG study was done with scalp electrodes positioned according to the 10-20 International system of electrode placement. Electrical activity was reviewed with band pass filter of 1-70Hz , sensitivity of 7 uV/mm, display speed of 93mm/sec with a 60Hz  notched filter applied as appropriate. EEG data were recorded continuously and digitally stored.  Video monitoring was available and reviewed as appropriate. Description: No clear posterior dominant rhythm was seen. EEG showed continuous generalized 3 to 6 Hz theta-delta slowing. Intermittently, rhythmic 4-5 Hz theta slowing was noted in left and right occipital region without definite evolution, lasting about  1 minute. Clinically at times patient had right arm tremor and at times no clinical signs were  seen ( lights were off and patient was mostly covered under blanket so difficult to  see). Hyperventilation and photic stimulation were not performed.   ABNORMALITY - Continuous slow, generalized IMPRESSION: This study is suggestive of moderate diffuse encephalopathy. No definite seizures were seen throughout the recording. Charlsie Quest   MR BRAIN WO CONTRAST Result Date: 08/18/2023 CLINICAL DATA:  Neuro deficit, acute, stroke suspected. Altered mental status. EXAM: MRI HEAD WITHOUT CONTRAST TECHNIQUE: Multiplanar, multiecho pulse sequences of the brain and surrounding structures were obtained without intravenous contrast. COMPARISON:  MRI brain 07/18/2023.  Head CT 08/18/2023. FINDINGS: Brain: No acute infarct or hemorrhage. Unchanged right-greater-than-left anterior frontal encephalomalacia, likely sequela of prior trauma. Unchanged encephalomalacia along the right frontal operculum and right middle frontal gyrus, likely sequela of prior infarct. Unchanged old infarcts in the right PCA territory and left cerebellar hemisphere. No hydrocephalus or extra-axial collection. No mass or midline shift. Vascular: Normal flow voids. Skull and upper cervical spine: Normal marrow signal. Sinuses/Orbits: Negative. Other: None. IMPRESSION: 1. No acute intracranial process. 2. Unchanged right-greater-than-left anterior frontal encephalomalacia, likely sequela of prior trauma. 3. Unchanged old infarcts in the right frontal operculum and right middle frontal gyrus. 4. Unchanged old infarcts in the right PCA territory and left cerebellar hemisphere. Electronically Signed   By: Orvan Falconer M.D.   On: 08/18/2023 19:38   DG Chest 1 View Result Date: 08/18/2023 CLINICAL DATA:  Left arm shaking unresponsive EXAM: CHEST  1 VIEW COMPARISON:  07/28/2023 FINDINGS: Mild cardiomegaly. No acute airspace disease or pleural effusion. No pneumothorax. Suspected AVN type changes of left humeral head. IMPRESSION: No active disease. Mild cardiomegaly. Electronically Signed   By: Jasmine Pang M.D.   On: 08/18/2023  15:54   CT Head Wo Contrast Result Date: 08/18/2023 CLINICAL DATA:  Mental status change, unknown cause EXAM: CT HEAD WITHOUT CONTRAST TECHNIQUE: Contiguous axial images were obtained from the base of the skull through the vertex without intravenous contrast. RADIATION DOSE REDUCTION: This exam was performed according to the departmental dose-optimization program which includes automated exposure control, adjustment of the mA and/or kV according to patient size and/or use of iterative reconstruction technique. COMPARISON:  CT head July 17, 2023. MRI head July 18, 2023 FINDINGS: Brain: Similar multifocal encephalomalacia, including in the inferior right greater than left frontal lobes, more superior right frontal lobe, and right parieto-occipital region. Small remote left cerebellar infarct. No evidence of acute large vascular territory infarct, acute hemorrhage, mass lesion, midline shift or hydrocephalus. Vascular: Calcific atherosclerosis. No hyperdense vessel identified. Skull: No acute fracture. Sinuses/Orbits: Mostly clear sinuses.  No acute orbital findings. Other: Mastoid effusions. IMPRESSION: 1. No evidence of acute intracranial abnormality. 2. Similar multifocal encephalomalacia, detailed above. Electronically Signed   By: Feliberto Harts M.D.   On: 08/18/2023 12:22   DG Abd 2 Views Result Date: 07/28/2023 CLINICAL DATA:  Vomiting EXAM: ABDOMEN - 2 VIEW COMPARISON:  07/19/2023, CT 07/15/2023 FINDINGS: Nonobstructive bowel gas pattern. No gross free intraperitoneal gas. Residual barium material within a decompressed distal colon. Dense calcification within the mid pelvis corresponds to calcific debris within the sigmoid colon. No nephro or urolithiasis. Vascular calcifications are seen. 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 No acute bone abnormality. IMPRESSION: 1.  Nonobstructive bowel gas pattern. Electronically Signed   By: Helyn Numbers M.D.   On: 07/28/2023 20:24   DG Chest Portable 1 View Result Date: 07/28/2023 CLINICAL DATA:  Globus sensation.  EXAM: PORTABLE CHEST 1 VIEW COMPARISON:  Chest radiograph dated 07/16/2023. FINDINGS: The heart size and mediastinal contours are within normal limits. Mild bilateral interstitial opacities. Mild left basilar atelectasis/airspace disease. No pleural effusion or pneumothorax. There is flattening of the left humeral head which may reflect avascular necrosis. IMPRESSION: 1. Mild bilateral interstitial opacities may reflect pulmonary edema or scarring. 2. Mild left basilar atelectasis/airspace disease. 3. Flattening of the left humeral head may reflect avascular necrosis and fracture. Electronically Signed   By: Romona Curls M.D.   On: 07/28/2023 16:42    Micro Results    Recent Results (from the past 240 hours)  Resp panel by RT-PCR (RSV, Flu A&B, Covid) Anterior Nasal Swab     Status: None   Collection Time: 08/18/23  1:15 PM   Specimen: Anterior Nasal Swab  Result Value Ref Range Status   SARS Coronavirus 2 by RT PCR NEGATIVE NEGATIVE Final    Comment: (NOTE) SARS-CoV-2 target nucleic acids are NOT DETECTED.  The SARS-CoV-2 RNA is generally detectable in upper respiratory specimens during the acute phase of infection. The lowest concentration of SARS-CoV-2 viral copies this assay can detect is 138 copies/mL. A negative result does not preclude SARS-Cov-2 infection and should not be used as the sole basis for treatment or other patient management decisions. A negative result may occur with  improper specimen collection/handling, submission of specimen other than nasopharyngeal swab, presence of viral mutation(s) within the areas targeted by this assay, and inadequate number of viral copies(<138 copies/mL). A negative result must be combined with clinical observations, patient history, and  epidemiological information. The expected result is Negative.  Fact Sheet for Patients:  BloggerCourse.com  Fact Sheet for Healthcare Providers:  SeriousBroker.it  This test is no t yet approved or cleared by the [place] FDA and  has been authorized for detection and/or diagnosis of SARS-CoV-2 by FDA under an Emergency Use Authorization (EUA). This EUA will remain  in effect (meaning this test can be used) for the duration of the COVID-19 declaration under Section 564(b)(1) of the Act, 21 U.S.C.section 360bbb-3(b)(1), unless the authorization is terminated  or revoked sooner.       Influenza A by PCR NEGATIVE NEGATIVE Final   Influenza B by PCR NEGATIVE NEGATIVE Final    Comment: (NOTE) The Xpert Xpress SARS-CoV-2/FLU/RSV plus assay is intended as an aid in the diagnosis of influenza from Nasopharyngeal swab specimens and should not be used as a sole basis for treatment. Nasal washings and aspirates are unacceptable for Xpert Xpress SARS-CoV-2/FLU/RSV testing.  Fact Sheet for Patients: BloggerCourse.com  Fact Sheet for Healthcare Providers: SeriousBroker.it  This test is not yet approved or cleared by the [place] FDA and has been authorized for detection and/or diagnosis of SARS-CoV-2 by FDA under an Emergency Use Authorization (EUA). This EUA will remain in effect (meaning this test can be used) for the duration of the COVID-19 declaration under Section 564(b)(1) of the Act, 21 U.S.C. section 360bbb-3(b)(1), unless the authorization is terminated or revoked.     Resp Syncytial Virus by PCR NEGATIVE NEGATIVE Final    Comment: (NOTE) Fact Sheet for Patients: BloggerCourse.com  Fact Sheet for Healthcare Providers: SeriousBroker.it  This test is not yet approved or cleared by the [place] FDA and has been  authorized for detection and/or diagnosis of SARS-CoV-2 by FDA under an Emergency Use Authorization (EUA). This EUA will remain in effect (meaning this test can be used) for the duration of the COVID-19 declaration under Section  564(b)(1) of the Act, 21 U.S.C. section 360bbb-3(b)(1), unless the authorization is terminated or revoked.  Performed at [facility], [address] [place], [place] [address]     Today   Subjective    Calvin [name] today has no headache,no chest abdominal pain,no new weakness tingling or numbness, feels much better    Objective   Blood pressure 109/68, pulse 77, temperature 98 F (36.7 C), temperature source Axillary, resp. rate 14, SpO2 94%.   Intake/Output Summary (Last 24 hours) at 08/23/2023 0713 Last data filed at 08/23/2023 0150 Gross per 24 hour  Intake --  Output 700 ml  Net -700 ml    Exam  Awake Alert, No new F.N deficits, quite frail over all, baseline mild tremors. [place].AT,PERRAL Supple Neck,   Symmetrical Chest wall movement, Good air movement bilaterally, CTAB RRR,No Gallops,   +ve B.Sounds, Abd Soft, Non tender,  No Cyanosis, Clubbing or edema    Data Review   Recent Labs  Lab 08/18/23 1115 08/20/23 0451 08/21/23 0522 08/22/23 0424 08/23/23 0511  WBC 9.0 6.5 8.2 5.5 4.9  HGB 12.1* 12.3* 12.6* 11.1* 11.6*  HCT 40.5 39.6 40.4 35.9* 37.8*  PLT 117* 99* 73* 100* 92*  MCV 97.8 95.0 94.4 95.5 94.7  MCH 29.2 29.5 29.4 29.5 29.1  MCHC 29.9* 31.1 31.2 30.9 30.7  RDW 13.7 14.0 13.9 13.8 13.9  LYMPHSABS 1.1  --  1.6 1.2 1.2  MONOABS 0.9  --  0.8 0.5 0.5  EOSABS 0.0  --  0.0 0.0 0.1  BASOSABS 0.0  --  0.0 0.0 0.0    Recent Labs  Lab 08/18/23 1115 08/20/23 0451 08/20/23 0726 08/21/23 0522 08/21/23 0757 08/22/23 0424 08/22/23 0853 08/23/23 0511  NA 141 141  --   --  140 142  --  143  K 5.1 5.1  --   --  5.4* 4.7  --  4.0  CL 106 109  --   --  109 111  --  111  CO2 26 20*  --   --  20* 22  --  26   ANIONGAP 9 12  --   --  11 9  --  6  GLUCOSE 97 72  --   --  82 89  --  110*  BUN 33* 14  --   --  17 17  --  11  CREATININE 1.86* 1.51*  --   --  1.28* 1.12  --  1.16  AST 13*  --   --   --  19 11*  --  11*  ALT 12  --   --   --  12 8  --  8  ALKPHOS 40  --   --   --  32* 32*  --  35*  BILITOT <0.2  --   --   --  0.8 0.5  --  0.4  ALBUMIN 3.5  --   --   --  2.7* 2.6*  --  2.7*  LATICACIDVEN 1.0  --   --   --   --   --   --   --   AMMONIA  --   --  26  --  84*  --  34 24  BNP  --   --   --  112.4*  --  63.0  --  38.8  MG  --   --  1.9  --  1.9 1.9  --  2.0  CALCIUM 9.1 9.1  --   --  8.7* 8.4*  --  8.7*    Total Time in preparing paper work, data evaluation and todays exam - 35 minutes  Signature  -    Susa Raring M.D on 08/23/2023 at 7:13 AM   -  To page go to [email or web address]

## 2023-08-23 NOTE — Plan of Care (Signed)

## 2023-08-27 ENCOUNTER — Ambulatory Visit: Payer: Medicare Other | Admitting: Surgery

## 2023-09-03 ENCOUNTER — Emergency Department: Payer: Medicare Other

## 2023-09-03 ENCOUNTER — Inpatient Hospital Stay
Admission: EM | Admit: 2023-09-03 | Discharge: 2023-11-03 | DRG: 853 | Disposition: E | Payer: Medicare Other | Attending: Pulmonary Disease | Admitting: Pulmonary Disease

## 2023-09-03 ENCOUNTER — Other Ambulatory Visit: Payer: Self-pay

## 2023-09-03 DIAGNOSIS — Z1152 Encounter for screening for COVID-19: Secondary | ICD-10-CM

## 2023-09-03 DIAGNOSIS — R569 Unspecified convulsions: Secondary | ICD-10-CM

## 2023-09-03 DIAGNOSIS — T81328A Disruption or dehiscence of closure of other specified internal operation (surgical) wound, initial encounter: Secondary | ICD-10-CM | POA: Diagnosis not present

## 2023-09-03 DIAGNOSIS — Z8249 Family history of ischemic heart disease and other diseases of the circulatory system: Secondary | ICD-10-CM

## 2023-09-03 DIAGNOSIS — G9341 Metabolic encephalopathy: Secondary | ICD-10-CM | POA: Diagnosis present

## 2023-09-03 DIAGNOSIS — Y838 Other surgical procedures as the cause of abnormal reaction of the patient, or of later complication, without mention of misadventure at the time of the procedure: Secondary | ICD-10-CM | POA: Diagnosis not present

## 2023-09-03 DIAGNOSIS — J449 Chronic obstructive pulmonary disease, unspecified: Secondary | ICD-10-CM | POA: Diagnosis present

## 2023-09-03 DIAGNOSIS — N400 Enlarged prostate without lower urinary tract symptoms: Secondary | ICD-10-CM | POA: Diagnosis present

## 2023-09-03 DIAGNOSIS — I5032 Chronic diastolic (congestive) heart failure: Secondary | ICD-10-CM | POA: Diagnosis present

## 2023-09-03 DIAGNOSIS — K567 Ileus, unspecified: Secondary | ICD-10-CM

## 2023-09-03 DIAGNOSIS — I4892 Unspecified atrial flutter: Secondary | ICD-10-CM | POA: Diagnosis present

## 2023-09-03 DIAGNOSIS — E785 Hyperlipidemia, unspecified: Secondary | ICD-10-CM | POA: Diagnosis present

## 2023-09-03 DIAGNOSIS — J9601 Acute respiratory failure with hypoxia: Secondary | ICD-10-CM | POA: Diagnosis present

## 2023-09-03 DIAGNOSIS — J690 Pneumonitis due to inhalation of food and vomit: Secondary | ICD-10-CM | POA: Diagnosis not present

## 2023-09-03 DIAGNOSIS — J440 Chronic obstructive pulmonary disease with acute lower respiratory infection: Secondary | ICD-10-CM | POA: Diagnosis present

## 2023-09-03 DIAGNOSIS — I69354 Hemiplegia and 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 side: Secondary | ICD-10-CM | POA: Diagnosis not present

## 2023-09-03 DIAGNOSIS — Z806 Family history of leukemia: Secondary | ICD-10-CM

## 2023-09-03 DIAGNOSIS — J9602 Acute respiratory failure with hypercapnia: Secondary | ICD-10-CM | POA: Diagnosis not present

## 2023-09-03 DIAGNOSIS — E43 Unspecified severe protein-calorie malnutrition: Secondary | ICD-10-CM | POA: Diagnosis present

## 2023-09-03 DIAGNOSIS — K435 Parastomal hernia without obstruction or  gangrene: Secondary | ICD-10-CM | POA: Diagnosis present

## 2023-09-03 DIAGNOSIS — N182 Chronic kidney disease, stage 2 (mild): Secondary | ICD-10-CM | POA: Diagnosis present

## 2023-09-03 DIAGNOSIS — E871 Hypo-osmolality and hyponatremia: Secondary | ICD-10-CM | POA: Diagnosis present

## 2023-09-03 DIAGNOSIS — R1310 Dysphagia, unspecified: Secondary | ICD-10-CM | POA: Diagnosis present

## 2023-09-03 DIAGNOSIS — Z809 Family history of malignant neoplasm, unspecified: Secondary | ICD-10-CM

## 2023-09-03 DIAGNOSIS — R578 Other shock: Secondary | ICD-10-CM | POA: Diagnosis not present

## 2023-09-03 DIAGNOSIS — M109 Gout, unspecified: Secondary | ICD-10-CM | POA: Diagnosis present

## 2023-09-03 DIAGNOSIS — D631 Anemia in chronic kidney disease: Secondary | ICD-10-CM | POA: Diagnosis present

## 2023-09-03 DIAGNOSIS — Y95 Nosocomial condition: Secondary | ICD-10-CM | POA: Diagnosis not present

## 2023-09-03 DIAGNOSIS — A4189 Other specified sepsis: Principal | ICD-10-CM | POA: Diagnosis present

## 2023-09-03 DIAGNOSIS — G40909 Epilepsy, unspecified, not intractable, without status epilepticus: Secondary | ICD-10-CM | POA: Diagnosis present

## 2023-09-03 DIAGNOSIS — A419 Sepsis, unspecified organism: Secondary | ICD-10-CM | POA: Diagnosis not present

## 2023-09-03 DIAGNOSIS — J09X1 Influenza due to identified novel influenza A virus with pneumonia: Secondary | ICD-10-CM | POA: Diagnosis not present

## 2023-09-03 DIAGNOSIS — Z79899 Other long term (current) drug therapy: Secondary | ICD-10-CM

## 2023-09-03 DIAGNOSIS — R7303 Prediabetes: Secondary | ICD-10-CM | POA: Diagnosis present

## 2023-09-03 DIAGNOSIS — I10 Essential (primary) hypertension: Secondary | ICD-10-CM | POA: Diagnosis present

## 2023-09-03 DIAGNOSIS — I130 Hypertensive heart and chronic kidney disease with heart failure and stage 1 through stage 4 chronic kidney disease, or unspecified chronic kidney disease: Secondary | ICD-10-CM | POA: Diagnosis present

## 2023-09-03 DIAGNOSIS — Z8673 Personal history of transient ischemic attack (TIA), and cerebral infarction without residual deficits: Secondary | ICD-10-CM

## 2023-09-03 DIAGNOSIS — E870 Hyperosmolality and hypernatremia: Secondary | ICD-10-CM | POA: Diagnosis not present

## 2023-09-03 DIAGNOSIS — E872 Acidosis, unspecified: Secondary | ICD-10-CM | POA: Diagnosis not present

## 2023-09-03 DIAGNOSIS — T17908A Unspecified foreign body in respiratory tract, part unspecified causing other injury, initial encounter: Secondary | ICD-10-CM

## 2023-09-03 DIAGNOSIS — K565 Intestinal adhesions [bands], unspecified as to partial versus complete obstruction: Secondary | ICD-10-CM

## 2023-09-03 DIAGNOSIS — Z85819 Personal history of malignant neoplasm of unspecified site of lip, oral cavity, and pharynx: Secondary | ICD-10-CM

## 2023-09-03 DIAGNOSIS — I4891 Unspecified atrial fibrillation: Secondary | ICD-10-CM | POA: Diagnosis present

## 2023-09-03 DIAGNOSIS — R54 Age-related physical debility: Secondary | ICD-10-CM | POA: Diagnosis present

## 2023-09-03 DIAGNOSIS — F419 Anxiety disorder, unspecified: Secondary | ICD-10-CM | POA: Diagnosis present

## 2023-09-03 DIAGNOSIS — Z6828 Body mass index (BMI) 28.0-28.9, adult: Secondary | ICD-10-CM

## 2023-09-03 DIAGNOSIS — J101 Influenza due to other identified influenza virus with other respiratory manifestations: Secondary | ICD-10-CM | POA: Diagnosis present

## 2023-09-03 DIAGNOSIS — J151 Pneumonia due to Pseudomonas: Secondary | ICD-10-CM | POA: Diagnosis present

## 2023-09-03 DIAGNOSIS — E860 Dehydration: Secondary | ICD-10-CM | POA: Diagnosis present

## 2023-09-03 DIAGNOSIS — I252 Old myocardial infarction: Secondary | ICD-10-CM

## 2023-09-03 DIAGNOSIS — Z9049 Acquired absence of other specified parts of digestive tract: Secondary | ICD-10-CM

## 2023-09-03 DIAGNOSIS — K5651 Intestinal adhesions [bands], with partial obstruction: Secondary | ICD-10-CM | POA: Diagnosis present

## 2023-09-03 DIAGNOSIS — Z7982 Long term (current) use of aspirin: Secondary | ICD-10-CM

## 2023-09-03 DIAGNOSIS — E875 Hyperkalemia: Secondary | ICD-10-CM | POA: Diagnosis not present

## 2023-09-03 DIAGNOSIS — Z515 Encounter for palliative care: Secondary | ICD-10-CM

## 2023-09-03 DIAGNOSIS — K5981 Ogilvie syndrome: Secondary | ICD-10-CM | POA: Diagnosis not present

## 2023-09-03 DIAGNOSIS — I2510 Atherosclerotic heart disease of native coronary artery without angina pectoris: Secondary | ICD-10-CM | POA: Diagnosis not present

## 2023-09-03 DIAGNOSIS — K9409 Other complications of colostomy: Secondary | ICD-10-CM | POA: Diagnosis not present

## 2023-09-03 DIAGNOSIS — N179 Acute kidney failure, unspecified: Secondary | ICD-10-CM | POA: Diagnosis present

## 2023-09-03 DIAGNOSIS — Z96642 Presence of left artificial hip joint: Secondary | ICD-10-CM | POA: Diagnosis present

## 2023-09-03 DIAGNOSIS — Z7189 Other specified counseling: Secondary | ICD-10-CM | POA: Diagnosis not present

## 2023-09-03 DIAGNOSIS — R000 Tachycardia, unspecified: Secondary | ICD-10-CM | POA: Diagnosis not present

## 2023-09-03 DIAGNOSIS — J1008 Influenza due to other identified influenza virus with other specified pneumonia: Secondary | ICD-10-CM | POA: Diagnosis present

## 2023-09-03 DIAGNOSIS — R6521 Severe sepsis with septic shock: Secondary | ICD-10-CM | POA: Diagnosis not present

## 2023-09-03 DIAGNOSIS — Z66 Do not resuscitate: Secondary | ICD-10-CM | POA: Diagnosis present

## 2023-09-03 DIAGNOSIS — T81321A Disruption or dehiscence of closure of internal operation (surgical) wound of abdominal wall muscle or fascia, initial encounter: Secondary | ICD-10-CM | POA: Diagnosis not present

## 2023-09-03 DIAGNOSIS — K56609 Unspecified intestinal obstruction, unspecified as to partial versus complete obstruction: Secondary | ICD-10-CM | POA: Diagnosis not present

## 2023-09-03 DIAGNOSIS — Z87891 Personal history of nicotine dependence: Secondary | ICD-10-CM

## 2023-09-03 DIAGNOSIS — Z85038 Personal history of other malignant neoplasm of large intestine: Secondary | ICD-10-CM

## 2023-09-03 LAB — COMPREHENSIVE METABOLIC PANEL
ALT: 43 U/L (ref 0–44)
AST: 41 U/L (ref 15–41)
Albumin: 3.9 g/dL (ref 3.5–5.0)
Alkaline Phosphatase: 87 U/L (ref 38–126)
Anion gap: 16 — ABNORMAL HIGH (ref 5–15)
BUN: 14 mg/dL (ref 8–23)
CO2: 22 mmol/L (ref 22–32)
Calcium: 9.1 mg/dL (ref 8.9–10.3)
Chloride: 96 mmol/L — ABNORMAL LOW (ref 98–111)
Creatinine, Ser: 2.48 mg/dL — ABNORMAL HIGH (ref 0.61–1.24)
GFR, Estimated: 27 mL/min — ABNORMAL LOW (ref >60)
Glucose, Bld: 149 mg/dL — ABNORMAL HIGH (ref 70–99)
Potassium: 4.7 mmol/L (ref 3.5–5.1)
Sodium: 134 mmol/L — ABNORMAL LOW (ref 135–145)
Total Bilirubin: 0.7 mg/dL (ref <1.2)
Total Protein: 8.6 g/dL — ABNORMAL HIGH (ref 6.5–8.1)

## 2023-09-03 LAB — CBC WITH DIFFERENTIAL/PLATELET
Abs Immature Granulocytes: 0.28 10*3/uL — ABNORMAL HIGH (ref 0.00–0.07)
Basophils Absolute: 0 10*3/uL (ref 0.0–0.1)
Basophils Relative: 0 %
Eosinophils Absolute: 0 10*3/uL (ref 0.0–0.5)
Eosinophils Relative: 0 %
HCT: 42.5 % (ref 39.0–52.0)
Hemoglobin: 12.9 g/dL — ABNORMAL LOW (ref 13.0–17.0)
Immature Granulocytes: 2 %
Lymphocytes Relative: 13 %
Lymphs Abs: 2.1 10*3/uL (ref 0.7–4.0)
MCH: 30.1 pg (ref 26.0–34.0)
MCHC: 30.4 g/dL (ref 30.0–36.0)
MCV: 99.1 fL (ref 80.0–100.0)
Monocytes Absolute: 0.8 10*3/uL (ref 0.1–1.0)
Monocytes Relative: 5 %
Neutro Abs: 13.2 10*3/uL — ABNORMAL HIGH (ref 1.7–7.7)
Neutrophils Relative %: 80 %
Platelets: 372 10*3/uL (ref 150–400)
RBC: 4.29 MIL/uL (ref 4.22–5.81)
RDW: 14 % (ref 11.5–15.5)
WBC: 16.4 10*3/uL — ABNORMAL HIGH (ref 4.0–10.5)
nRBC: 0.5 % — ABNORMAL HIGH (ref 0.0–0.2)

## 2023-09-03 LAB — URINALYSIS, W/ REFLEX TO CULTURE (INFECTION SUSPECTED)
Bilirubin Urine: NEGATIVE
Glucose, UA: NEGATIVE mg/dL
Hgb urine dipstick: NEGATIVE
Ketones, ur: NEGATIVE mg/dL
Nitrite: NEGATIVE
Protein, ur: 30 mg/dL — AB
Specific Gravity, Urine: 1.026 (ref 1.005–1.030)
WBC, UA: >50 WBC/hpf (ref 0–5)
pH: 5 (ref 5.0–8.0)

## 2023-09-03 LAB — RESP PANEL BY RT-PCR (RSV, FLU A&B, COVID)  RVPGX2
Influenza A by PCR: POSITIVE — AB
Influenza B by PCR: NEGATIVE
Resp Syncytial Virus by PCR: NEGATIVE
SARS Coronavirus 2 by RT PCR: NEGATIVE

## 2023-09-03 LAB — BLOOD GAS, ARTERIAL
Acid-base deficit: 4.1 mmol/L — ABNORMAL HIGH (ref 0.0–2.0)
Bicarbonate: 21.6 mmol/L (ref 20.0–28.0)
O2 Saturation: 98.7 %
Patient temperature: 37
pCO2 arterial: 41 mm[Hg] (ref 32–48)
pH, Arterial: 7.33 — ABNORMAL LOW (ref 7.35–7.45)
pO2, Arterial: 99 mm[Hg] (ref 83–108)

## 2023-09-03 LAB — LACTIC ACID, PLASMA
Lactic Acid, Venous: 2.8 mmol/L (ref 0.5–1.9)
Lactic Acid, Venous: 3 mmol/L (ref 0.5–1.9)
Lactic Acid, Venous: 3.8 mmol/L (ref 0.5–1.9)
Lactic Acid, Venous: 3.8 mmol/L (ref 0.5–1.9)
Lactic Acid, Venous: 4.1 mmol/L (ref 0.5–1.9)
Lactic Acid, Venous: 2.9 mmol/L (ref 0.5–1.9)

## 2023-09-03 LAB — APTT: aPTT: 32 s (ref 24–36)

## 2023-09-03 LAB — CBG MONITORING, ED: Glucose-Capillary: 95 mg/dL (ref 70–99)

## 2023-09-03 LAB — MRSA NEXT GEN BY PCR, NASAL: MRSA by PCR Next Gen: DETECTED — AB

## 2023-09-03 LAB — PROTIME-INR
INR: 1.1 (ref 0.8–1.2)
Prothrombin Time: 14.2 s (ref 11.4–15.2)

## 2023-09-03 MED ORDER — MIDODRINE HCL 5 MG PO TABS
2.5000 mg | ORAL_TABLET | Freq: Three times a day (TID) | ORAL | Status: DC
  Administered 2023-09-03 – 2023-09-04 (×2): 2.5 mg via ORAL
  Filled 2023-09-03 (×2): qty 1

## 2023-09-03 MED ORDER — MOMETASONE FURO-FORMOTEROL FUM 200-5 MCG/ACT IN AERO
2.0000 | INHALATION_SPRAY | Freq: Two times a day (BID) | RESPIRATORY_TRACT | Status: DC
  Administered 2023-09-03 – 2023-09-20 (×33): 2 via RESPIRATORY_TRACT
  Filled 2023-09-03: qty 8.8

## 2023-09-03 MED ORDER — IPRATROPIUM-ALBUTEROL 0.5-2.5 (3) MG/3ML IN SOLN
3.0000 mL | Freq: Four times a day (QID) | RESPIRATORY_TRACT | Status: DC
  Administered 2023-09-03 – 2023-09-04 (×7): 3 mL via RESPIRATORY_TRACT
  Filled 2023-09-03 (×7): qty 3

## 2023-09-03 MED ORDER — LACTATED RINGERS IV SOLN
INTRAVENOUS | Status: AC
Start: 2023-09-03 — End: 2023-09-03

## 2023-09-03 MED ORDER — VANCOMYCIN HCL IN DEXTROSE 1-5 GM/200ML-% IV SOLN
1000.0000 mg | Freq: Once | INTRAVENOUS | Status: AC
  Administered 2023-09-03: 1000 mg via INTRAVENOUS
  Filled 2023-09-03: qty 200

## 2023-09-03 MED ORDER — MELATONIN 5 MG PO TABS
10.0000 mg | ORAL_TABLET | Freq: Every day | ORAL | Status: DC
  Administered 2023-09-03 – 2023-09-20 (×18): 10 mg via ORAL
  Filled 2023-09-03 (×18): qty 2

## 2023-09-03 MED ORDER — ONDANSETRON HCL 4 MG/2ML IJ SOLN
4.0000 mg | Freq: Four times a day (QID) | INTRAMUSCULAR | Status: DC | PRN
  Administered 2023-09-03 – 2023-09-05 (×2): 4 mg via INTRAVENOUS
  Filled 2023-09-03 (×2): qty 2

## 2023-09-03 MED ORDER — ASPIRIN 81 MG PO CHEW
81.0000 mg | CHEWABLE_TABLET | Freq: Every day | ORAL | Status: DC
  Administered 2023-09-04 – 2023-09-05 (×2): 81 mg via ORAL
  Filled 2023-09-03 (×2): qty 1

## 2023-09-03 MED ORDER — SENNA 8.6 MG PO TABS: 2.0000 | ORAL_TABLET | Freq: Every evening | ORAL | Status: DC | PRN

## 2023-09-03 MED ORDER — BACLOFEN 10 MG PO TABS
5.0000 mg | ORAL_TABLET | Freq: Two times a day (BID) | ORAL | Status: DC
  Administered 2023-09-03 – 2023-09-20 (×31): 5 mg via ORAL
  Filled 2023-09-03 (×5): qty 1
  Filled 2023-09-03: qty 0.5
  Filled 2023-09-03: qty 1
  Filled 2023-09-03: qty 0.5
  Filled 2023-09-03 (×9): qty 1
  Filled 2023-09-03: qty 0.5
  Filled 2023-09-03 (×6): qty 1
  Filled 2023-09-03 (×2): qty 0.5
  Filled 2023-09-03 (×4): qty 1
  Filled 2023-09-03: qty 0.5
  Filled 2023-09-03 (×4): qty 1
  Filled 2023-09-03: qty 0.5
  Filled 2023-09-03 (×4): qty 1

## 2023-09-03 MED ORDER — ACETAMINOPHEN 325 MG PO TABS
650.0000 mg | ORAL_TABLET | Freq: Four times a day (QID) | ORAL | Status: DC | PRN
  Filled 2023-09-03: qty 2

## 2023-09-03 MED ORDER — BUDESONIDE 0.5 MG/2ML IN SUSP
2.0000 mg | Freq: Two times a day (BID) | RESPIRATORY_TRACT | Status: DC
  Administered 2023-09-03 – 2023-09-04 (×4): 2 mg via RESPIRATORY_TRACT
  Filled 2023-09-03 (×4): qty 8

## 2023-09-03 MED ORDER — ONDANSETRON HCL 4 MG/2ML IJ SOLN
4.0000 mg | Freq: Once | INTRAMUSCULAR | Status: AC
  Administered 2023-09-03: 4 mg via INTRAVENOUS
  Filled 2023-09-03: qty 2

## 2023-09-03 MED ORDER — TAMSULOSIN HCL 0.4 MG PO CAPS
0.4000 mg | ORAL_CAPSULE | Freq: Every evening | ORAL | Status: DC
  Administered 2023-09-03 – 2023-09-20 (×16): 0.4 mg via ORAL
  Filled 2023-09-03 (×17): qty 1

## 2023-09-03 MED ORDER — OSELTAMIVIR PHOSPHATE 30 MG PO CAPS
30.0000 mg | ORAL_CAPSULE | Freq: Two times a day (BID) | ORAL | Status: AC
  Administered 2023-09-03 – 2023-09-07 (×9): 30 mg via ORAL
  Filled 2023-09-03 (×10): qty 1

## 2023-09-03 MED ORDER — LEVETIRACETAM 500 MG PO TABS: 1000.0000 mg | ORAL_TABLET | Freq: Two times a day (BID) | ORAL | Status: DC

## 2023-09-03 MED ORDER — SODIUM CHLORIDE 0.9 % IV SOLN
2.0000 g | Freq: Two times a day (BID) | INTRAVENOUS | Status: DC
  Administered 2023-09-03 – 2023-09-04 (×2): 2 g via INTRAVENOUS
  Filled 2023-09-03 (×2): qty 12.5

## 2023-09-03 MED ORDER — ACETAMINOPHEN 500 MG PO TABS
1000.0000 mg | ORAL_TABLET | Freq: Once | ORAL | Status: AC
  Administered 2023-09-03: 1000 mg via ORAL
  Filled 2023-09-03: qty 2

## 2023-09-03 MED ORDER — LEVETIRACETAM IN NACL 1000 MG/100ML IV SOLN
1000.0000 mg | Freq: Two times a day (BID) | INTRAVENOUS | Status: DC
  Administered 2023-09-03 – 2023-09-15 (×24): 1000 mg via INTRAVENOUS
  Filled 2023-09-03 (×26): qty 100

## 2023-09-03 MED ORDER — SODIUM CHLORIDE 0.9 % IV BOLUS
500.0000 mL | Freq: Once | INTRAVENOUS | Status: AC
  Administered 2023-09-03: 500 mL via INTRAVENOUS

## 2023-09-03 MED ORDER — HEPARIN SODIUM (PORCINE) 5000 UNIT/ML IJ SOLN
5000.0000 [IU] | Freq: Two times a day (BID) | INTRAMUSCULAR | Status: DC
  Administered 2023-09-03 – 2023-09-05 (×5): 5000 [IU] via SUBCUTANEOUS
  Filled 2023-09-03 (×5): qty 1

## 2023-09-03 MED ORDER — SODIUM CHLORIDE 0.9 % IV BOLUS (SEPSIS)
1000.0000 mL | Freq: Once | INTRAVENOUS | Status: AC
Start: 2023-09-03 — End: 2023-09-03
  Administered 2023-09-03: 1000 mL via INTRAVENOUS

## 2023-09-03 MED ORDER — GUAIFENESIN ER 600 MG PO TB12
600.0000 mg | ORAL_TABLET | Freq: Two times a day (BID) | ORAL | Status: AC
Start: 2023-09-03 — End: ?
  Administered 2023-09-03 – 2023-09-13 (×17): 600 mg via ORAL
  Filled 2023-09-03 (×18): qty 1

## 2023-09-03 MED ORDER — POLYETHYLENE GLYCOL 3350 17 G PO PACK
17.0000 g | PACK | Freq: Two times a day (BID) | ORAL | Status: DC
  Administered 2023-09-04 – 2023-09-20 (×28): 17 g via ORAL
  Filled 2023-09-03 (×30): qty 1

## 2023-09-03 MED ORDER — SODIUM CHLORIDE 0.9 % IV SOLN
2.0000 g | Freq: Once | INTRAVENOUS | Status: AC
  Administered 2023-09-03: 2 g via INTRAVENOUS
  Filled 2023-09-03: qty 12.5

## 2023-09-03 MED ORDER — IPRATROPIUM-ALBUTEROL 0.5-2.5 (3) MG/3ML IN SOLN: 3.0000 mL | Freq: Four times a day (QID) | RESPIRATORY_TRACT | Status: DC | PRN

## 2023-09-03 MED ORDER — METRONIDAZOLE 500 MG/100ML IV SOLN
500.0000 mg | Freq: Two times a day (BID) | INTRAVENOUS | Status: DC
  Administered 2023-09-03 – 2023-09-04 (×2): 500 mg via INTRAVENOUS
  Filled 2023-09-03 (×2): qty 100

## 2023-09-03 MED ORDER — ACETAMINOPHEN 325 MG RE SUPP
650.0000 mg | Freq: Once | RECTAL | Status: AC
  Administered 2023-09-03: 650 mg via RECTAL
  Filled 2023-09-03: qty 2

## 2023-09-03 MED ORDER — LACTULOSE 10 GM/15ML PO SOLN
20.0000 g | Freq: Every day | ORAL | Status: DC
  Administered 2023-09-04 – 2023-09-20 (×14): 20 g via ORAL
  Filled 2023-09-03 (×16): qty 30

## 2023-09-03 MED ORDER — PANTOPRAZOLE SODIUM 40 MG PO TBEC
40.0000 mg | DELAYED_RELEASE_TABLET | Freq: Every day | ORAL | Status: DC
  Administered 2023-09-04 – 2023-09-05 (×2): 40 mg via ORAL
  Filled 2023-09-03 (×2): qty 1

## 2023-09-03 MED ORDER — SODIUM CHLORIDE 0.9 % IV BOLUS
1000.0000 mL | Freq: Once | INTRAVENOUS | Status: AC
Start: 2023-09-03 — End: 2023-09-03
  Administered 2023-09-03: 1000 mL via INTRAVENOUS

## 2023-09-03 MED ORDER — METRONIDAZOLE 500 MG/100ML IV SOLN
500.0000 mg | Freq: Once | INTRAVENOUS | Status: AC
  Administered 2023-09-03: 500 mg via INTRAVENOUS
  Filled 2023-09-03: qty 100

## 2023-09-03 MED ORDER — PREGABALIN 75 MG PO CAPS
200.0000 mg | ORAL_CAPSULE | Freq: Every day | ORAL | Status: DC
  Administered 2023-09-03 – 2023-09-20 (×18): 200 mg via ORAL
  Filled 2023-09-03 (×17): qty 1

## 2023-09-03 MED ORDER — LAMOTRIGINE 25 MG PO TABS
50.0000 mg | ORAL_TABLET | Freq: Two times a day (BID) | ORAL | Status: DC
  Administered 2023-09-03 – 2023-09-20 (×32): 50 mg via ORAL
  Filled 2023-09-03 (×16): qty 1
  Filled 2023-09-03: qty 2
  Filled 2023-09-03 (×2): qty 1
  Filled 2023-09-03: qty 2
  Filled 2023-09-03 (×7): qty 1
  Filled 2023-09-03: qty 2
  Filled 2023-09-03 (×6): qty 1

## 2023-09-03 NOTE — Progress Notes (Signed)
CODE SEPSIS - PHARMACY COMMUNICATION  **Broad Spectrum Antibiotics should be administered within 1 hour of Sepsis diagnosis**  Time Code Sepsis Called/Page Received: 0307  Antibiotics Ordered: Cefepime, Flagyl, Vancomycin  Time of 1st antibiotic administration: 1610  Otelia Sergeant, PharmD, [facility] 09/03/2023 3:06 AM

## 2023-09-03 NOTE — H&P (Signed)
History and Physical    Calvin [name] ZOX:[id number] DOB: 10/05/1950 DOA: 09/03/2023  PCP: Keane Police, MD (Confirm with patient/family/NH records and if not entered, this has to be entered at [facility] point of entry) Patient coming from: SNF  I have personally briefly reviewed patient's old medical records in [facility]  Chief Complaint: SOB, cough, N/V  HPI: Calvin [name] is a 72 y.o. 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  Symptoms started yesterday patient started to develop cramping-like abdominal pain, centrally located, associated with nauseous vomiting of stomach content, multiple times denies any blood or bile in the vomitus, no fever or chills.  Overnight patient started to have productive cough with light-colored phlegm no chest pains. ED Course: Fever 101.3, tachycardia heart rate in the 140s responded to monitor level close and heart rate improved to low 110s, blood pressure 90/60, O2 saturation 86% on room air was placed on BiPAP and breathing rate and oxygen improved 95% on 4 L.  Chest x-ray showed left lower lobe infiltrates.  Blood work showed WBC 16.4, hemoglobin 12.9, creatinine 2.4 compared to baseline 1.1, K4.7, bicarb 22.  Influenza positive COVID-negative.  Patient was given a total of 1 L IV bolus, vancomycin and cefepime and Flagyl  Review of Systems: As per HPI otherwise 14 point review of systems negative.    Past Medical History:  Diagnosis Date   Alcohol abuse    drinks on weekend   Anemia    Anxiety    Arthritis    Cancer (HCC)    colon,throat   COPD (chronic obstructive pulmonary disease) (HCC)    Coronary artery disease    Depression    Gout     Hemiplegia and 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 side (HCC)    Hypertension    Myocardial infarction (HCC)    Neuromuscular disorder (HCC)    Ogilvie syndrome    Seizures (HCC)    last 6 months ago   Stroke [facility])    multiple  left side weakness   Tremors of nervous system    Unstable angina [facility])     Past Surgical History:  Procedure Laterality Date   CARPAL TUNNEL RELEASE Left 10/19/2015   Procedure: CARPAL TUNNEL RELEASE;  Surgeon: Kennedy Bucker, MD;  Location: [facility] ORS;  Service: Orthopedics;  Laterality: Left;   COLON SURGERY     COLONOSCOPY WITH PROPOFOL N/A 10/26/2021   Procedure: COLONOSCOPY WITH PROPOFOL;  Surgeon: Regis Bill, MD;  Location: [facility] ENDOSCOPY;  Service: Endoscopy;  Laterality: N/A;   COLONOSCOPY WITH PROPOFOL N/A 06/02/2022   Procedure: COLONOSCOPY WITH PROPOFOL;  Surgeon: Regis Bill, MD;  Location: [facility] ENDOSCOPY;  Service: Endoscopy;  Laterality: N/A;   COLONOSCOPY WITH PROPOFOL N/A 06/06/2022   Procedure: COLONOSCOPY WITH PROPOFOL;  Surgeon: Regis Bill, MD;  Location: [facility] ENDOSCOPY;  Service: Endoscopy;  Laterality: N/A;   JOINT REPLACEMENT     left partial hip    THROAT SURGERY  2013   cancer     reports that he has quit smoking. His smoking use included cigarettes. He has never used smokeless tobacco. He reports that he does not currently use alcohol. He reports that he does not use drugs.  No Known Allergies  Family  History  Problem Relation Age of Onset   Cancer Mother    Hypertension Father    Leukemia Brother      Prior to Admission medications   Medication Sig Start Date End Date Taking? Authorizing Provider  aspirin 81 MG chewable tablet Take 81 mg by mouth daily.   Yes [provider]  baclofen (LIORESAL) 5 mg TABS tablet Take 5 mg by mouth 2 (two) times daily.   Yes [provider]  diazePAM, 20 MG Dose, (VALTOCO 20 MG DOSE) 2 x 10 MG/0.1ML LQPK Place 20 mg  into the nose as needed (seizure lasting over 2 minutes). 08/23/23  Yes Leroy Sea, MD  guaiFENesin (MUCINEX) 600 MG 12 hr tablet Take 600 mg by mouth every 12 (twelve) hours. 09/02/23 09/07/23 Yes [provider]  ipratropium-albuterol (DUONEB) 0.5-2.5 (3) MG/3ML SOLN Inhale 3 mLs into the lungs every 6 (six) hours as needed. 09/02/23  Yes [provider]  lactulose (CHRONULAC) 10 GM/15ML solution Take 20 g by mouth daily. 08/15/23  Yes [provider]  lamoTRIgine (LAMICTAL) 25 MG tablet Take 50 mg by mouth 2 (two) times daily.   Yes [provider]  levETIRAcetam (KEPPRA) 1000 MG tablet Take 1 tablet (1,000 mg total) by mouth 2 (two) times daily. 08/23/23  Yes Leroy Sea, MD  Melatonin 10 MG TABS Take 10 mg by mouth at bedtime.   Yes [provider]  nitroGLYCERIN (NITROSTAT) 0.4 MG SL tablet Place 0.4 mg under the tongue 3 (three) times daily as needed for chest pain. Every 5 minutes up to 3 doses 08/24/23  Yes [provider]  pantoprazole (PROTONIX) 40 MG tablet Take 1 tablet (40 mg total) by mouth daily. 08/23/23  Yes Leroy Sea, MD  polyethylene glycol (MIRALAX / GLYCOLAX) 17 g packet Take 17 g by mouth 2 (two) times daily. 08/19/23  Yes Wieting, Richard, MD  pregabalin (LYRICA) 100 MG capsule Take 200 mg by mouth at bedtime.   Yes [provider]  senna (SENOKOT) 8.6 MG TABS tablet Take 2 tablets (17.2 mg total) by mouth at bedtime as needed for mild constipation or moderate constipation. 06/26/23  Yes Sunnie Nielsen, DO  tamsulosin (FLOMAX) 0.4 MG CAPS capsule Take 0.4 mg by mouth every evening.   Yes [provider]  divalproex (DEPAKOTE) 125 MG DR tablet Take 4 tablets (500 mg total) by mouth 2 (two) times daily for 1 day, THEN 4 tablets (500 mg total) daily for 3 days, THEN 2 tablets (250 mg total) daily for 3 days, THEN 1 tablet (125 mg total) daily for 3 days. Patient not taking: Reported on  09/03/2023 08/23/23 09/03/23  Leroy Sea, MD  fluticasone [facility]) 50 MCG/ACT nasal spray Place 2 sprays into both nostrils daily. Patient not taking: Reported on 09/03/2023 05/15/23   [provider]  Mouthwashes (MOUTH RINSE) LIQD solution 15 mLs by Mouth Rinse route every 2 (two) hours. Patient not taking: Reported on 09/03/2023 08/19/23   Alford Highland, MD  pregabalin (LYRICA) 50 MG capsule Take 50 mg capsule twice daily and 100 mg (two capsules) at bedtime Patient not taking: Reported on 09/03/2023 08/23/23   Leroy Sea, MD  sodium chloride 0.9 % infusion Inject 75 mLs into the vein continuous. Patient not taking: Reported on 09/03/2023 08/19/23   Alford Highland, MD  tiotropium (SPIRIVA) 18 MCG inhalation capsule Place 18 mcg into inhaler and inhale daily. Patient not taking: Reported on 09/03/2023    [provider]    Physical Exam: Vitals:   09/03/23 0656 09/03/23 0730 09/03/23 0744 09/03/23 0745  BP:  (!) 74/55    Pulse:  (!) 114  (!) 113  Resp:  (!) 25  (!) 32  Temp: (!) 101.4 F (38.6 C)  98.4 F (36.9 C)   TempSrc: Axillary  Oral   SpO2:  91%  96%    Constitutional: NAD, calm, comfortable Vitals:   09/03/23 0656 09/03/23 0730 09/03/23 0744 09/03/23 0745  BP:  (!) 74/55    Pulse:  (!) 114  (!) 113  Resp:  (!) 25  (!) 32  Temp: (!) 101.4 F (38.6 C)  98.4 F (36.9 C)   TempSrc: Axillary  Oral   SpO2:  91%  96%   Eyes: PERRL, lids and conjunctivae normal ENMT: Mucous membranes are dry. Posterior pharynx clear of any exudate or lesions.Normal dentition.  Neck: normal, supple, no masses, no thyromegaly Respiratory: clear to auscultation bilaterally, scattered wheezing, coarse crackle on bilateral lower fields, increasing respiratory effort. No accessory muscle use.  Cardiovascular: Regular rate and rhythm, no murmurs / rubs / gallops. No extremity edema. 2+ pedal pulses. No carotid bruits.  Abdomen: tenderness on periumbilical  area, no rebound no guarding, ostomy bag very little output, no masses palpated. No hepatosplenomegaly. Bowel sounds positive.  Musculoskeletal: no clubbing / cyanosis. No joint deformity upper and lower extremities. Good ROM, no contractures. Normal muscle tone.  Skin: no rashes, lesions, ulcers. No induration Neurologic: CN 2-12 grossly intact. Sensation intact, DTR normal. Strength 5/5 in all 4.  Psychiatric: Normal judgment and insight. Alert and oriented x 3. Normal mood.     Labs on Admission: I have personally reviewed following labs and imaging studies  CBC: Recent Labs  Lab 09/03/23 0308  WBC 16.4*  NEUTROABS 13.2*  HGB 12.9*  HCT 42.5  MCV 99.1  PLT 372   Basic Metabolic Panel: Recent Labs  Lab 09/03/23 0308  NA 134*  K 4.7  CL 96*  CO2 22  GLUCOSE 149*  BUN 14  CREATININE 2.48*  CALCIUM 9.1   GFR: CrCl cannot be calculated (Unknown ideal weight.). Liver Function Tests: Recent Labs  Lab 09/03/23 0308  AST 41  ALT 43  ALKPHOS 87  BILITOT 0.7  PROT 8.6*  ALBUMIN 3.9   No results for input(s): "LIPASE", "AMYLASE" in the last 168 hours. No results for input(s): "AMMONIA" in the last 168 hours. Coagulation Profile: Recent Labs  Lab 09/03/23 0308  INR 1.1   Cardiac Enzymes: No results for input(s): "CKTOTAL", "CKMB", "CKMBINDEX", "TROPONINI" in the last 168 hours. BNP (last 3 results) No results for input(s): "PROBNP" in the last 8760 hours. HbA1C: No results for input(s): "HGBA1C" in the last 72 hours. CBG: No results for input(s): "GLUCAP" in the last 168 hours. Lipid Profile: No results for input(s): "CHOL", "HDL", "LDLCALC", "TRIG", "CHOLHDL", "LDLDIRECT" in the last 72 hours. Thyroid Function Tests: No results for input(s): "TSH", "T4TOTAL", "FREET4", "T3FREE", "THYROIDAB" in the last 72 hours. Anemia Panel: No results for input(s): "VITAMINB12", "FOLATE", "FERRITIN", "TIBC", "IRON", "RETICCTPCT" in the last 72 hours. Urine analysis:     Component Value Date/Time   COLORURINE AMBER (A) 09/03/2023 0543   APPEARANCEUR CLOUDY (A) 09/03/2023 0543   APPEARANCEUR Clear 08/14/2014 0933   LABSPEC 1.026 09/03/2023 0543   LABSPEC 1.015 08/14/2014 0933   PHURINE 5.0 09/03/2023 0543   GLUCOSEU NEGATIVE 09/03/2023 0543   GLUCOSEU Negative 08/14/2014 0933   HGBUR NEGATIVE 09/03/2023 0543  BILIRUBINUR NEGATIVE 09/03/2023 0543   BILIRUBINUR Negative 08/14/2014 0933   KETONESUR NEGATIVE 09/03/2023 0543   PROTEINUR 30 (A) 09/03/2023 0543   UROBILINOGEN 0.2 06/12/2010 0103   NITRITE NEGATIVE 09/03/2023 0543   LEUKOCYTESUR LARGE (A) 09/03/2023 0543   LEUKOCYTESUR Negative 08/14/2014 0933    Radiological Exams on Admission: DG Abdomen 1 View Result Date: 09/03/2023 CLINICAL DATA:  72 year old male with abdominal distension. EXAM: ABDOMEN - 1 VIEW COMPARISON:  Abdominal radiographs 07/28/2023 and earlier. FINDINGS: Portable AP supine views at 0432 hours. Left abdominal colostomy redemonstrated. There is nondilated upstream large bowel from the colostomy with small volume of retained oral contrast. Dilated gas-filled small bowel loops elsewhere in the abdomen now up to 4 cm diameter and substantially increased since 07/28/2023. The appearance is similar to CT Abdomen and Pelvis 07/15/2023. Decompressed distal colon, distal to the ostomy. Aortoiliac and iliofemoral vascular calcifications. Stable visualized osseous structures. Left hip arthroplasty. No pneumoperitoneum evident on these supine views. IMPRESSION: Recurrent small-bowel ileus or small-bowel obstruction, similar to CT Abdomen and Pelvis 07/15/2023. Electronically Signed   By: Odessa Fleming M.D.   On: 09/03/2023 05:35   DG Chest Port 1 View Result Date: 09/03/2023 CLINICAL DATA:  Questionable sepsis - evaluate for abnormality Abdominal pain.  Respiratory distress. EXAM: PORTABLE CHEST 1 VIEW COMPARISON:  Radiograph 08/18/2023 FINDINGS: Rotated exam. Stable cardiomegaly. Unchanged  mediastinal contours. Mild elevation of right hemidiaphragm. Ill-defined opacities developing at the left lung base. No pulmonary edema. No pneumothorax or pleural effusion. Remote left rib fractures. IMPRESSION: 1. Ill-defined opacities developing at the left lung base, suspicious for pneumonia. 2. Stable cardiomegaly. Electronically Signed   By: Narda Rutherford M.D.   On: 09/03/2023 03:34    EKG: Independently reviewed.  Sinus tachycardia, no acute ST changes.  QTc 460  Assessment/Plan Principal Problem:   Acute respiratory failure with hypoxia (HCC) Active Problems:   Sepsis (HCC)  (please populate well all problems here in Problem List. (For example, if patient is on BP meds at home and you resume or decide to hold them, it is a problem that needs to be her. Same for CAD, COPD, HLD and so on)  Severe sepsis -Sepsis evidenced by new onset of fever, leukocytosis tachycardia hypoxia, with signs of acute organ damage of AKI, source of infection is influenza A infection as well as likely combined aspiration pneumonia from repeated nauseous vomiting -Start Tamiflu, renal adjusted -Continue cefepime and Flagyl to cover HCAP and aspiration pneumonia -N.p.o., speech evaluation -Continue with IV fluid, given endorsed history of chronic HFrEF, will drop the IVF rate to 100 mL/h  Acute hypoxic respiratory failure -Multifactorial from encephalopathy pneumonia, likely concurrent HCAP versus aspiration pneumonia, antibiotics management as above -Breathing treatment -N.p.o., HOB 30 degree  SBO -NGT -PRN Zofran -Consult surgeon  AKI on CKD stage II -Clinically patient still appeared to be dry with elevated atelectasis and persistent hypotension -Continue IV fluid -I & O  Chronic combined HFrEF and HFpEF -Clinically appears to be volume depleted, IVF, repeat chest x-ray tomorrow  Seizure disorder -No acute concern -Change Keppra from p.o. to IV, continue Lamictal and pregabalin once able to  take p.o. -On recent hospitalization, Depakote was tapered down to discontinue, a total of 11 days starting from December 19, yesterday with the last day.  No more Depakote.  DVT prophylaxis: Heparin subcu Code Status: Full code Family Communication: None at bedside Disposition Plan: Patient sick with sepsis secondary to HCAP and concurrent influenza A pneumonia, SBO requiring inpatient surgical consultation,  expect more than 2 midnight hospital stay Consults called: Surgery Admission status: PCU admit   Emeline General MD [facility] Pager [phone] 09/03/2023, 7:57 AM

## 2023-09-03 NOTE — ED Notes (Signed)
Attempted NG insertion with Calvin [name]. Pt did not tolerate. Pt unable to assist in insertion, repeatedly stating "I understand nothing" .  Will notify MD unable to complete.

## 2023-09-03 NOTE — ED Triage Notes (Addendum)
Pt to ED via [facility] from [facility] C/O abd pain, N/V, and resp distress. Pt has been vomiting all day yesterday. Per EMS, pt with bilateral rales. 72% RA, placed on 15L NRB has gone up to 88%. Pt maintaining sats in the high 80s. Ems unable to obtain BP 4mg  zofran given by EMS. Hx of HTN, COPD, stroke

## 2023-09-03 NOTE — Consult Note (Signed)
[facility] SURGICAL CONSULTATION NOTE (initial) - cpt: [id number]   HISTORY OF PRESENT ILLNESS (HPI):  72 y.o. male presented to [facility] ED overnight for abdominal pain. 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 He has had numerous hospitalizations since that time with dilated loops of bowel. He has not needed re-intervention. Patient presenting to the ED overnight from care facility for reported decline over the last 48 hours including nausea, emesis, and fever to 102F. He was also noted to be hypoxic and as such, EMS was called. Care facility did report diarrhea via colostomy earlier in the day. Work up in the ED revealed leukocytosis to 16.4K, Hgb to 12.9 which was stable, AKI with sCr - 2.48, hyponatremia to 134, initial venous lactate acid was 2.8 (3.8 this AM), Bcx negative to date. He did have CXR which was concerning for possible PNA. KUB with dilated loops of small bowel concerning for ileus vs SBO, similar to previous studies. He was admitted to medicine service. He was started on cefepime and Flagyl for suspected PNA. NGT placement was attempted but patient was not amenable to this.   Surgery is consulted by hospitalist physician Dr. Mikey College, MD in this context for evaluation and management of possible SBO.   PAST MEDICAL HISTORY (PMH):  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 side (HCC)    Hypertension    Myocardial infarction (HCC)    Neuromuscular disorder (HCC)    Ogilvie syndrome    Seizures (HCC)    last 6 months ago   Stroke [facility])    multiple  left  side weakness   Tremors of nervous system    Unstable angina (HCC)      PAST SURGICAL HISTORY (PSH):  Past Surgical History:  Procedure Laterality Date   CARPAL TUNNEL RELEASE Left 10/19/2015   Procedure: CARPAL TUNNEL RELEASE;  Surgeon: Kennedy Bucker, MD;  Location: [facility] ORS;  Service: Orthopedics;  Laterality: Left;   COLON SURGERY     COLONOSCOPY WITH PROPOFOL N/A 10/26/2021   Procedure: COLONOSCOPY WITH PROPOFOL;  Surgeon: Regis Bill, MD;  Location: [facility] ENDOSCOPY;  Service: Endoscopy;  Laterality: N/A;   COLONOSCOPY WITH PROPOFOL N/A 06/02/2022   Procedure: COLONOSCOPY WITH PROPOFOL;  Surgeon: Regis Bill, MD;  Location: [facility] ENDOSCOPY;  Service: Endoscopy;  Laterality: N/A;   COLONOSCOPY WITH PROPOFOL N/A 06/06/2022   Procedure: COLONOSCOPY WITH PROPOFOL;  Surgeon: Regis Bill, MD;  Location: [facility] ENDOSCOPY;  Service: Endoscopy;  Laterality: N/A;   JOINT REPLACEMENT     left partial hip    THROAT SURGERY  2013   cancer     MEDICATIONS:  Prior to Admission medications   Medication Sig Start Date End Date Taking? Authorizing Provider  aspirin 81 MG chewable tablet Take 81 mg by mouth daily.   Yes [provider]  baclofen (LIORESAL) 5 mg TABS tablet Take 5 mg by mouth 2 (two) times daily.   Yes [provider]  diazePAM, 20 MG Dose, (VALTOCO 20 MG DOSE) 2 x 10 MG/0.1ML LQPK  Place 20 mg into the nose as needed (seizure lasting over 2 minutes). 08/23/23  Yes Leroy Sea, MD  guaiFENesin (MUCINEX) 600 MG 12 hr tablet Take 600 mg by mouth every 12 (twelve) hours. 09/02/23 09/07/23 Yes [provider]  ipratropium-albuterol (DUONEB) 0.5-2.5 (3) MG/3ML SOLN Inhale 3 mLs into the lungs every 6 (six) hours as needed. 09/02/23  Yes [provider]  lactulose (CHRONULAC) 10 GM/15ML solution Take 20 g by mouth daily. 08/15/23  Yes [provider]  lamoTRIgine (LAMICTAL) 25 MG tablet Take 50 mg by mouth 2 (two) times daily.    Yes [provider]  levETIRAcetam (KEPPRA) 1000 MG tablet Take 1 tablet (1,000 mg total) by mouth 2 (two) times daily. 08/23/23  Yes Leroy Sea, MD  Melatonin 10 MG TABS Take 10 mg by mouth at bedtime.   Yes [provider]  nitroGLYCERIN (NITROSTAT) 0.4 MG SL tablet Place 0.4 mg under the tongue 3 (three) times daily as needed for chest pain. Every 5 minutes up to 3 doses 08/24/23  Yes [provider]  pantoprazole (PROTONIX) 40 MG tablet Take 1 tablet (40 mg total) by mouth daily. 08/23/23  Yes Leroy Sea, MD  polyethylene glycol (MIRALAX / GLYCOLAX) 17 g packet Take 17 g by mouth 2 (two) times daily. 08/19/23  Yes Wieting, Richard, MD  pregabalin (LYRICA) 100 MG capsule Take 200 mg by mouth at bedtime.   Yes [provider]  senna (SENOKOT) 8.6 MG TABS tablet Take 2 tablets (17.2 mg total) by mouth at bedtime as needed for mild constipation or moderate constipation. 06/26/23  Yes Sunnie Nielsen, DO  tamsulosin (FLOMAX) 0.4 MG CAPS capsule Take 0.4 mg by mouth every evening.   Yes [provider]  divalproex (DEPAKOTE) 125 MG DR tablet Take 4 tablets (500 mg total) by mouth 2 (two) times daily for 1 day, THEN 4 tablets (500 mg total) daily for 3 days, THEN 2 tablets (250 mg total) daily for 3 days, THEN 1 tablet (125 mg total) daily for 3 days. Patient not taking: Reported on 09/03/2023 08/23/23 09/03/23  Leroy Sea, MD  fluticasone [facility]) 50 MCG/ACT nasal spray Place 2 sprays into both nostrils daily. Patient not taking: Reported on 09/03/2023 05/15/23   [provider]  Mouthwashes (MOUTH RINSE) LIQD solution 15 mLs by Mouth Rinse route every 2 (two) hours. Patient not taking: Reported on 09/03/2023 08/19/23   Alford Highland, MD  pregabalin (LYRICA) 50 MG capsule Take 50 mg capsule twice daily and 100 mg (two capsules) at bedtime Patient not taking: Reported on 09/03/2023 08/23/23   Leroy Sea, MD  sodium  chloride 0.9 % infusion Inject 75 mLs into the vein continuous. Patient not taking: Reported on 09/03/2023 08/19/23   Alford Highland, MD  tiotropium (SPIRIVA) 18 MCG inhalation capsule Place 18 mcg into inhaler and inhale daily. Patient not taking: Reported on 09/03/2023    [provider]     ALLERGIES:  No Known Allergies   SOCIAL HISTORY:  Social History   Socioeconomic History   Marital status: Single    Spouse name: Not on file   Number of children: Not on file   Years of education: Not on file   Highest education level: Not on file  Occupational History   Not on file  Tobacco Use   Smoking status: Former    Types: Cigarettes   Smokeless tobacco: Never  Substance and Sexual Activity   Alcohol use: Not Currently  Comment: weekends   Drug use: No   Sexual activity: Not on file  Other Topics Concern   Not on file  Social History Narrative   Not on file   Social Drivers of Health   Financial Resource Strain: Not on file  Food Insecurity: Patient Unable To Answer (08/20/2023)   Hunger Vital Sign    Worried About Running Out of Food in the Last Year: Patient unable to answer    Ran Out of Food in the Last Year: Patient unable to answer  Transportation Needs: Patient Unable To Answer (08/20/2023)   PRAPARE - Transportation    Lack of Transportation (Medical): Patient unable to answer    Lack of Transportation (Non-Medical): Patient unable to answer  Physical Activity: Not on file  Stress: Not on file  Social Connections: Not on file  Intimate Partner Violence: Patient Unable To Answer (08/20/2023)   Humiliation, Afraid, Rape, and Kick questionnaire    Fear of Current or Ex-Partner: Patient unable to answer    Emotionally Abused: Patient unable to answer    Physically Abused: Patient unable to answer    Sexually Abused: Patient unable to answer     FAMILY HISTORY:  Family History  Problem Relation Age of Onset   Cancer Mother    Hypertension  Father    Leukemia Brother       REVIEW OF SYSTEMS:  Review of Systems  Constitutional:  Positive for fever. Negative for chills.  Respiratory:  Positive for cough. Negative for shortness of breath.   Cardiovascular:  Negative for chest pain and palpitations.  Gastrointestinal:  Positive for abdominal pain, nausea and vomiting.  Genitourinary:  Negative for dysuria and urgency.  All other systems reviewed and are negative.   VITAL SIGNS:  Temp:  [98.4 F (36.9 C)-101.4 F (38.6 C)] 98.4 F (36.9 C) (12/30 0744) Pulse Rate:  [113-149] 117 (12/30 0830) Resp:  [22-40] 24 (12/30 0830) BP: (74-129)/(53-67) 84/53 (12/30 0830) SpO2:  [86 %-100 %] 92 % (12/30 0830) FiO2 (%):  [100 %] 100 % (12/30 0347)             INTAKE/OUTPUT:  No intake/output data recorded.  PHYSICAL EXAM:  Physical Exam Vitals and nursing note reviewed. Exam conducted with a chaperone present.  Constitutional:      General: He is not in acute distress.    Comments: Patient resting in bed; coughing.  Mentation appears near baseline   HENT:     Head: Normocephalic and atraumatic.  Eyes:     General: No scleral icterus.    Extraocular Movements: Extraocular movements intact.  Cardiovascular:     Rate and Rhythm: Tachycardia present.     Heart sounds: Normal heart sounds.  Pulmonary:     Effort: Pulmonary effort is normal. No respiratory distress.  Abdominal:     General: A surgical scar is present. The ostomy site is clean. There is distension.     Palpations: Abdomen is soft.     Tenderness: There is no abdominal tenderness. There is no guarding or rebound.       Comments: Abdomen is soft, no overt tenderness, mild distension and tympany. 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 There is scant liquid stool in bag.   Genitourinary:    Comments: Deferred Neurological:     Mental Status: He is alert.     Loop Colostomy  (09/03/2023):    Labs:     Latest  Ref Rng & Units 09/03/2023    3:08 AM 08/23/2023    5:11 AM 08/22/2023    4:24 AM  CBC  WBC 4.0 - 10.5 K/uL 16.4  4.9  5.5   Hemoglobin 13.0 - 17.0 g/dL 40.9  81.1  91.4   Hematocrit 39.0 - 52.0 % 42.5  37.8  35.9   Platelets 150 - 400 K/uL 372  92  100       Latest Ref Rng & Units 09/03/2023    3:08 AM 08/23/2023    5:11 AM 08/22/2023    4:24 AM  CMP  Glucose 70 - 99 mg/dL 782  956  89   BUN 8 - 23 mg/dL 14  11  17    Creatinine 0.61 - 1.24 mg/dL 2.13  0.86  5.78   Sodium 135 - 145 mmol/L 134  143  142   Potassium 3.5 - 5.1 mmol/L 4.7  4.0  4.7   Chloride 98 - 111 mmol/L 96  111  111   CO2 22 - 32 mmol/L 22  26  22    Calcium 8.9 - 10.3 mg/dL 9.1  8.7  8.4   Total Protein 6.5 - 8.1 g/dL 8.6  6.1  5.9   Total Bilirubin <1.2 mg/dL 0.7  0.4  0.5   Alkaline Phos 38 - 126 U/L 87  35  32   AST 15 - 41 U/L 41  11  11   ALT 0 - 44 U/L 43  8  8      Imaging studies:   KUB (09/03/2023) personally reviewed with dilation small bowel, no free air, this is similar to previous examinations, and radiologist report reviewed below: IMPRESSION: Recurrent small-bowel ileus or small-bowel obstruction, similar to CT Abdomen and Pelvis 07/15/2023.   Assessment/Plan: (ICD-10's: K38.609) 72 y.o. 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 Also with reducible parastomal hernia as well.  - Agree with NGT placement although patient has not been amenable to this nor tolerant.  - I do not think he warrants any emergent surgical intervention currently, and he is certainly a  poor candidate for this given his baseline health.    - Agree with Abx for HCAP - Monitor abdominal examination; on-going colostomy function - Serial KUB as needed    - Monitor leukocytosis  - Monitor renal function; UO  - Further management per primary service; we will follow   All of the above findings and recommendations were discussed with the patient, and all of patient's questions were answered to his expressed satisfaction.  Thank you for the opportunity to participate in this patient's care.   -- Lynden Oxford, PA-C [facility] 09/03/2023, 9:55 AM M-F: 7am - 4pm

## 2023-09-03 NOTE — ED Notes (Signed)
[place] increased to 5L to maintain SpO2 >92%

## 2023-09-03 NOTE — Progress Notes (Signed)
Pt being followed by [facility] for Sepsis protocol. 

## 2023-09-03 NOTE — Progress Notes (Signed)
Pharmacy Antibiotic Note  Calvin [name] is a 72 y.o. male admitted on 09/03/2023 with pneumonia.  Pharmacy has been consulted for cefepime dosing.  -from care facility, recent hospital admission at [facility] 12/15-12/19/24 -received cefepime 2 gm , metronidazole 500mg  and Vancomycin 1 gm x 1 each in ED -hx of Dysphagia 1 diet -influenza A +:  on tamiflu   Plan: Cefepime 2 gm IV q12h    (using recent weight of 90.7 kg from 08/18/23 chart hx)  Crcl 34 ml/min  -also on metronidazole 500mg  IV q12h  -f/u renal fxn, weight, cultures, length of therapy, etc     Temp (24hrs), Avg:100.4 F (38 C), Min:98.4 F (36.9 C), Max:101.4 F (38.6 C)  Recent Labs  Lab 09/03/23 0308 09/03/23 0510  WBC 16.4*  --   CREATININE 2.48*  --   LATICACIDVEN 2.8* 3.0*    CrCl cannot be calculated (Unknown ideal weight.).    No Known Allergies  Antimicrobials this admission: cefepime , metronidazole, Vancomycin 1 gm x 1 each in ED Cefepime 12/30>> Metronidazole 12/30>>  Dose adjustments this admission:    Microbiology results: 12/20 BCx: NG<12hr   UCx:      Sputum:      MRSA PCR: pending  Thank you for allowing pharmacy to be a part of this patient's care.  Lakya Schrupp A 09/03/2023 8:13 AM

## 2023-09-03 NOTE — ED Notes (Addendum)
colostomy bag emptied

## 2023-09-03 NOTE — ED Notes (Signed)
Dr Chipper Herb notified of bp 79/54. Awaiting orders.

## 2023-09-03 NOTE — ED Notes (Signed)
Paduchowski MD notified of pts bp

## 2023-09-03 NOTE — ED Notes (Signed)
AC Nathanial Rancher notified on the need for IV team to be contacted regarding IV need and as STAT order.

## 2023-09-03 NOTE — ED Notes (Signed)
RN to bedside to assess. Pt with bipap on. Unable to demonstrate ability to remove bipap mask if needed for emesis. Removed for safety. Placed on 4L [place]. SpO2 remains >93%. Will notify MD

## 2023-09-03 NOTE — Evaluation (Signed)
Clinical/Bedside Swallow Evaluation Patient Details  Name: Calvin [name] MRN: [id number] Date of Birth: Jun 13, 1951  Today's Date: 09/03/2023 Time: SLP Start Time (ACUTE ONLY): 0945 SLP Stop Time (ACUTE ONLY): 1000 SLP Time Calculation (min) (ACUTE ONLY): 15 min  Past Medical History:  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 side (HCC)    Hypertension    Myocardial infarction (HCC)    Neuromuscular disorder (HCC)    Ogilvie syndrome    Seizures (HCC)    last 6 months ago   Stroke [facility])    multiple  left side weakness   Tremors of nervous system    Unstable angina (HCC)    Past Surgical History:  Past Surgical History:  Procedure Laterality Date   CARPAL TUNNEL RELEASE Left 10/19/2015   Procedure: CARPAL TUNNEL RELEASE;  Surgeon: Kennedy Bucker, MD;  Location: [facility] ORS;  Service: Orthopedics;  Laterality: Left;   COLON SURGERY     COLONOSCOPY WITH PROPOFOL N/A 10/26/2021   Procedure: COLONOSCOPY WITH PROPOFOL;  Surgeon: Regis Bill, MD;  Location: [facility] ENDOSCOPY;  Service: Endoscopy;  Laterality: N/A;   COLONOSCOPY WITH PROPOFOL N/A 06/02/2022   Procedure: COLONOSCOPY WITH PROPOFOL;  Surgeon: Regis Bill, MD;  Location: [facility] ENDOSCOPY;  Service: Endoscopy;  Laterality: N/A;   COLONOSCOPY WITH PROPOFOL N/A 06/06/2022   Procedure: COLONOSCOPY WITH PROPOFOL;  Surgeon: Regis Bill, MD;  Location: [facility] ENDOSCOPY;  Service: Endoscopy;  Laterality: N/A;   JOINT REPLACEMENT     left partial hip    THROAT SURGERY  2013   cancer   HPI:  Calvin [name] is a 72 y.o. male with medical history significant of seizure disorder, CVA with residual left-sided hemiparesis, COPD,  chronic HFpEF, CKD stage II, HTN, colon cancer status post transverse colectomy and colostomy, presented from nusring home after nausea and vomiting, pt was hypoxic and febrile and EMS was called. Arrived with saturations in 70s and was placed on 15L via NRB mask. DG abdomen 09/03/23 showed " Recurrent small-bowel ileus or small-bowel obstruction, similar to  CT Abdomen and Pelvis 07/15/2023." Patient has prior history of dysphagia, most recently on nectar/puree diet. During recent admission, FEES 07/20/23 penetrated liquids to glottis, absent epiglottic deflection, so PES opening with backflow, senses and ejects penetrate and aspirate and significant pharyngeal residue and harder to clear than thin liqiuds, cleared with liquid wash, recommended full liquids per surgery and advance to puree when clear and pt is expected to cough a great deal.    Assessment / Plan / Recommendation  Clinical Impression   Patient presents with increased risk for aspiration in the setting of current pneumonia, nausea/vomiting, history of dysphagia and current respiratory status. Upon initial assessment, vocal quality is weak and hoarse, oral motor examination reveals no focal deficits. When attempting respositioning from partially reclined, patient exhibited significant coughing productive of yellow sputum, complained of feeling there is something in his throat. Respiratory rate fluctuating, initially in 20s however ranging 30-35 once positioned more upright. Oral suction and oral care provided, however refrained from administering POs due to respiratory status as well as persisting nausea. RN reports no vomiting since  7am. Recommend NPO pending improvements in respiratory status, further evaluation of SBO/ileus.    SLP Visit Diagnosis: Dysphagia, unspecified (R13.10)    Aspiration Risk  Moderate aspiration risk;Severe aspiration risk    Diet Recommendation NPO    Medication Administration: Via alternative means    Other   Recommendations Oral Care Recommendations: Oral care QID Caregiver Recommendations: Remove water pitcher;Avoid jello, ice cream, thin soups, popsicles;Have oral suction available    Recommendations for follow up therapy are one component of a multi-disciplinary discharge planning process, led by the attending physician.  Recommendations may be updated based on patient status, additional functional criteria and insurance authorization.  Follow up Recommendations Skilled nursing-short term rehab (<3 hours/day)      Assistance Recommended at Discharge    Functional Status Assessment Patient has had a recent decline in their functional status and/or demonstrates limited ability to make significant improvements in function in a reasonable and predictable amount of time  Frequency and Duration min 2x/week  2 weeks       Prognosis Prognosis for improved oropharyngeal function: Fair Barriers to Reach Goals: Cognitive deficits      Swallow Study   General Date of Onset: 09/03/23 HPI: Calvin [name] is a 72 y.o. male with medical history significant of seizure disorder, CVA with residual left-sided hemiparesis, COPD, chronic HFpEF, CKD stage II, HTN, colon cancer status post transverse colectomy and colostomy, presented from nusring home after nausea and vomiting, pt was hypoxic and febrile and EMS was called. Arrived with saturations in 70s and was placed on 15L via NRB mask. DG abdomen 09/03/23 showed " Recurrent small-bowel ileus or small-bowel obstruction, similar to  CT Abdomen and Pelvis 07/15/2023." Patient has prior history of dysphagia, most recently on nectar/puree diet. During recent admission, FEES 07/20/23 penetrated liquids to glottis, absent epiglottic deflection, so PES opening with backflow, senses and ejects penetrate and aspirate and significant pharyngeal residue and harder to clear than thin liqiuds, cleared with liquid wash, recommended full liquids per surgery and advance to puree  when clear and pt is expected to cough a great deal. Type of Study: Bedside Swallow Evaluation Previous Swallow Assessment: see HPI Diet Prior to this Study: NPO Temperature Spikes Noted: Yes Respiratory Status: Nasal cannula (6L) History of Recent Intubation: No Behavior/Cognition: Alert;Cooperative;Requires cueing Oral Cavity Assessment: Within Functional Limits Oral Care Completed by SLP: Yes Oral Cavity - Dentition: Edentulous Vision: Functional for self-feeding Self-Feeding Abilities: Other (Comment) (withheld due to increasing RR) Patient Positioning: Partially reclined Baseline Vocal Quality: Low vocal intensity;Hoarse;Breathy Volitional Cough: Strong Volitional Swallow: Able to elicit    Oral/Motor/Sensory Function Overall Oral Motor/Sensory Function: Within functional limits   Ice Chips Ice chips: Not tested   Thin Liquid Thin Liquid: Not tested    Nectar Thick Nectar Thick Liquid: Not tested   Honey Thick Honey Thick Liquid: Not tested   Puree Puree: Not tested   Solid     Solid: Not tested     Rondel Baton, MS, CCC-SLP Speech-Language Pathologist [facility]: [phone]  Arlana Lindau 09/03/2023,10:22 AM

## 2023-09-03 NOTE — ED Notes (Signed)
IV Team at the bedside. 

## 2023-09-03 NOTE — ED Notes (Addendum)
Dr Chipper Herb aware of bp 81/58 and gcs 11

## 2023-09-03 NOTE — ED Notes (Signed)
Medication crushed and admin in applesauce without difficulty swallowing or choking.

## 2023-09-03 NOTE — ED Notes (Signed)
Dr Chipper Herb notified of bp 79/56 and pt placed on 4 L [place]

## 2023-09-03 NOTE — ED Notes (Signed)
Dr Sunnie Nielsen for prn orders for temp

## 2023-09-03 NOTE — ED Provider Notes (Signed)
[facility] Provider Note    Event Date/Time   First MD Initiated Contact with Patient 09/03/23 0302     (approximate)  History   Chief Complaint: Abdominal Pain  HPI  Calvin [name] is a 72 y.o. male coming from his nursing facility, history of CVA with left-sided deficits, COPD, colostomy, presents to the emergency department for abdominal pain nausea vomiting, now with fever and hypoxia.  According to report from the nurse practitioner at the care facility for the last 2 days patient has had a decline has been coughing on occasion now with nausea vomiting found to be febrile to 102 tonight and received an IM dose of Rocephin.  Later tonight patient found to be hypoxic and EMS was called.  EMS states saturations in the 70s placed on a nonrebreather mask on 15 L was able to increase the O2 level to upper 80s.  Patient has audible rhonchi on presentation.  Care facility reports a history of aspiration in the past as well.  Here patient is awake alert he describes abdominal pain nausea and vomiting starting around 4:00 today does feel like his abdomen is distended.  Colostomy has minimal output currently although nursing home reports that the patient developed diarrhea in his colostomy earlier today.  Physical Exam   Triage Vital Signs: ED Triage Vitals  Encounter Vitals Group     BP      Systolic BP Percentile      Diastolic BP Percentile      Pulse      Resp      Temp      Temp src      SpO2      Weight      Height      Head Circumference      Peak Flow      Pain Score      Pain Loc      Pain Education      Exclude from Growth Chart     Most recent vital signs: There were no vitals filed for this visit.  General: Awake, no distress.  CV:  Good peripheral perfusion.  Regular rate and rhythm  Resp:  Tachypneic around 30 to 35 breaths/min mild rhonchi auscultated bilaterally. Abd:  Soft, mild diffuse tenderness.  Mild distention.  Colostomy with  minimal output.  ED Results / Procedures / Treatments   EKG  EKG viewed and interpreted by myself shows tachycardia rate 147 bpm with a narrow QRS, left axis deviation, largely normal intervals, nonspecific ST changes.  No ST elevation.  RADIOLOGY  I have reviewed and interpreted the x-ray images.  No obvious consolidation on my evaluation. Radiology has read the x-ray is consistent with likely left lower pneumonia   MEDICATIONS ORDERED IN ED: Medications  lactated ringers infusion (has no administration in time range)  sodium chloride 0.9 % bolus 1,000 mL (has no administration in time range)  ceFEPIme (MAXIPIME) 2 g in sodium chloride 0.9 % 100 mL IVPB (has no administration in time range)  metroNIDAZOLE (FLAGYL) IVPB 500 mg (has no administration in time range)  vancomycin (VANCOCIN) IVPB 1000 mg/200 mL premix (has no administration in time range)     IMPRESSION / MDM / ASSESSMENT AND PLAN / ED COURSE  I reviewed the triage vital signs and the nursing notes.  Patient's presentation is most consistent with acute presentation with potential threat to life or bodily function.  Patient presents to the emergency department via emergency traffic  from his care facility for abdominal discomfort distention nausea vomiting and reported diarrhea from his ostomy.  Patient noted to be hypoxic tonight, history of aspiration in the past highly suspect aspiration given audible rhonchi on arrival.  Given the patient's fever tachypnea meeting sepsis criteria we will start the patient on broad-spectrum antibiotics, send labs, cultures obtain a portable chest x-ray.  Ideally I would like to obtain a CT scan abdomen/pelvis once the patient is more clinically stable.  Currently satting around 90% on a nonrebreather mask.  I did discuss with the patient intubation should this be necessary, patient is agreeable to intubation as a last resort.  Given the patient's vomiting with continued significant nausea I  do not believe the patient would be a good bypass candidate currently.  We will reassess after antiemetics.  Patient is tolerating BiPAP well currently satting 100% on BiPAP heart rate remains elevated but is coming down currently 133.  Patient's workup today shows an elevated white blood cell count of 16,000, chemistry shows mild renal insufficiency and anion gap of 16.  Patient receiving IV fluids.  Lactic acid slightly elevated at 2.8.  Blood cultures have been sent.  Patient's chest x-ray shows possible left lung base pneumonia.  Patient's influenza A test is positive.  I suspect the patient has been experiencing influenza A which is led to cough congestion and vomiting over the last 2 days.  Given the patient's history of aspiration suspect that the patient has likely aspirated in addition to his influenza A.  Patient is doing well on BiPAP.  Blood pressure is stable 129/67.  Heart rate is coming down with fluids.  Will admit to the hospital service for ongoing workup and treatment.  CRITICAL CARE Performed by: Minna Antis   Total critical care time: 45 minutes  Critical care time was exclusive of separately billable procedures and treating other patients.  Critical care was necessary to treat or prevent imminent or life-threatening deterioration.  Critical care was time spent personally by me on the following activities: development of treatment plan with patient and/or surrogate as well as nursing, discussions with consultants, evaluation of patient's response to treatment, examination of patient, obtaining history from patient or surrogate, ordering and performing treatments and interventions, ordering and review of laboratory studies, ordering and review of radiographic studies, pulse oximetry and re-evaluation of patient's condition.   FINAL CLINICAL IMPRESSION(S) / ED DIAGNOSES   Fever Sepsis Nausea vomiting diarrhea Aspiration pneumonia Influenza A    Note:  This document  was prepared using Dragon voice recognition software and may include unintentional dictation errors.   Minna Antis, MD 09/03/23 [phone]

## 2023-09-03 NOTE — ED Notes (Signed)
 Pt repositioned in bed per family request.

## 2023-09-04 ENCOUNTER — Inpatient Hospital Stay: Payer: Medicare Other

## 2023-09-04 DIAGNOSIS — J9601 Acute respiratory failure with hypoxia: Secondary | ICD-10-CM | POA: Diagnosis not present

## 2023-09-04 DIAGNOSIS — K5981 Ogilvie syndrome: Secondary | ICD-10-CM | POA: Diagnosis not present

## 2023-09-04 DIAGNOSIS — M109 Gout, unspecified: Secondary | ICD-10-CM | POA: Insufficient documentation

## 2023-09-04 DIAGNOSIS — C187 Malignant neoplasm of sigmoid colon: Secondary | ICD-10-CM | POA: Insufficient documentation

## 2023-09-04 LAB — BASIC METABOLIC PANEL
Anion gap: 9 (ref 5–15)
BUN: 19 mg/dL (ref 8–23)
CO2: 22 mmol/L (ref 22–32)
Calcium: 7.8 mg/dL — ABNORMAL LOW (ref 8.9–10.3)
Chloride: 105 mmol/L (ref 98–111)
Creatinine, Ser: 1.6 mg/dL — ABNORMAL HIGH (ref 0.61–1.24)
GFR, Estimated: 45 mL/min — ABNORMAL LOW (ref >60)
Glucose, Bld: 96 mg/dL (ref 70–99)
Potassium: 4.2 mmol/L (ref 3.5–5.1)
Sodium: 136 mmol/L (ref 135–145)

## 2023-09-04 LAB — CBC
HCT: 31.9 % — ABNORMAL LOW (ref 39.0–52.0)
Hemoglobin: 9.6 g/dL — ABNORMAL LOW (ref 13.0–17.0)
MCH: 30.5 pg (ref 26.0–34.0)
MCHC: 30.1 g/dL (ref 30.0–36.0)
MCV: 101.3 fL — ABNORMAL HIGH (ref 80.0–100.0)
Platelets: 210 10*3/uL (ref 150–400)
RBC: 3.15 MIL/uL — ABNORMAL LOW (ref 4.22–5.81)
RDW: 14 % (ref 11.5–15.5)
WBC: 12.8 10*3/uL — ABNORMAL HIGH (ref 4.0–10.5)
nRBC: 0.2 % (ref 0.0–0.2)

## 2023-09-04 LAB — LACTIC ACID, PLASMA: Lactic Acid, Venous: 3.1 mmol/L (ref 0.5–1.9)

## 2023-09-04 MED ORDER — LACTATED RINGERS IV SOLN: INTRAVENOUS | Status: AC

## 2023-09-04 MED ORDER — IPRATROPIUM-ALBUTEROL 0.5-2.5 (3) MG/3ML IN SOLN
3.0000 mL | Freq: Three times a day (TID) | RESPIRATORY_TRACT | Status: DC
  Administered 2023-09-05: 3 mL via RESPIRATORY_TRACT
  Filled 2023-09-04: qty 3

## 2023-09-04 MED ORDER — MORPHINE SULFATE (PF) 2 MG/ML IV SOLN
2.0000 mg | INTRAVENOUS | Status: DC | PRN
  Administered 2023-09-04 – 2023-09-05 (×3): 2 mg via INTRAVENOUS
  Filled 2023-09-04 (×3): qty 1

## 2023-09-04 MED ORDER — SODIUM CHLORIDE 0.9 % IV SOLN
3.0000 g | Freq: Four times a day (QID) | INTRAVENOUS | Status: DC
Start: 2023-09-04 — End: 2023-09-08
  Administered 2023-09-04 – 2023-09-08 (×17): 3 g via INTRAVENOUS
  Filled 2023-09-04 (×19): qty 8

## 2023-09-04 MED ORDER — OXYCODONE HCL 5 MG PO TABS
5.0000 mg | ORAL_TABLET | Freq: Four times a day (QID) | ORAL | Status: DC | PRN
  Administered 2023-09-04 – 2023-09-10 (×4): 5 mg via ORAL
  Filled 2023-09-04 (×4): qty 1

## 2023-09-04 MED ORDER — MORPHINE SULFATE (PF) 2 MG/ML IV SOLN
1.0000 mg | INTRAVENOUS | Status: DC | PRN
  Administered 2023-09-04: 1 mg via INTRAVENOUS
  Filled 2023-09-04: qty 1

## 2023-09-04 MED ORDER — PREDNISONE 20 MG PO TABS
40.0000 mg | ORAL_TABLET | Freq: Every day | ORAL | Status: DC
  Administered 2023-09-04 – 2023-09-09 (×5): 40 mg via ORAL
  Filled 2023-09-04 (×5): qty 2

## 2023-09-04 NOTE — Consult Note (Signed)
 WOC Nurse ostomy consult note Stoma type/location: stoma with prolapse Verified with staff/MD not acute. Surgery aware  Output minimal reported; KUB showing signs of obstruction, surgery aware. No acute needs for surgery per notes.  Ostomy pouching: 2pc. 2 3/4 is traditional pouching used for this patient and has been used in the past.  I have provided both Gerlean # for 2 3/4 and 4 skin barrier if larger skin barrier needed to accommodate stoma.  Education provided: NA, patient is completely dependent in ostomy care Enrolled patient in [employer] DC program: Yes, previously.    Re consult if needed, will not follow at this time. Thanks  Dacota Devall [employer], RN,CWOCN, CNS, CWON-AP [phone])

## 2023-09-04 NOTE — Consult Note (Addendum)
 Pharmacy Antibiotic Note  Calvin [name] is a 72 y.o. male admitted on 09/03/2023 with  aspiration pneumonia . They presented to the ED for worsening nauseous and vomiting with SOB. 12/30 developed cramping-like abdominal pain and nauseous vomiting. Started to productive cough with light-color phlegm and no chest pains. Pharmacy has been consulted for Unasyn  dosing.  Today, 09/04/2023 D2 of antibiotics  Lactic acid 2.9 Scr 1.6 (baseline around 1.2) CrCl based on historical weight (90.7 kg from 08/18/23) is 47 mL/min using adjusted body weight or 52 mL/min using TBW WBC 12.8 (down trending) Oxygen RA down from [place] 4L per chart (per MD note still on 5L 12/31 note); continue to monitor  Imaging Chest 12/31:  pulmonary edema with developing airspace opacity within the left lower lung, potentially alveolar pulmonary edema though conceivably infection/aspiration could have a similar appearance.  Plan: Start Unasyn  3g Q6H for aspiration PNA Continue to monitor renal function (improving) Requested update weight; pending Monitoring for improvement in O2, WBC and s/sx to request stop date (minimum 5 days with resolution of s/sx)  Patient is also on oseltamivir  D2 of therapy   Temp (24hrs), Avg:99 F (37.2 C), Min:98.6 F (37 C), Max:99.5 F (37.5 C)  Recent Labs  Lab 09/03/23 0308 09/03/23 0510 09/03/23 0738 09/03/23 1100 09/03/23 1802 09/03/23 2025 09/04/23 0455  WBC 16.4*  --   --   --   --   --  12.8*  CREATININE 2.48*  --   --   --   --   --  1.60*  LATICACIDVEN 2.8* 3.0* 3.8* 3.8* 4.1* 2.9*  --     CrCl cannot be calculated (Unknown ideal weight.).    No Known Allergies  Antimicrobials this admission: 12/30 Cefepime  2g Q12H >> 12/31 12/30 Flagyl  500 mg BID >> 12/31 12/30 Vancomycin  x1 12/31Unasyn 3g Q6H >>   Dose adjustments this admission: NA  Microbiology results: 12/30 BCx: NGTD 12/30 UCx: pending  12/30 MRSA PCR: positive  Thank you for allowing pharmacy to be a  part of this patient's care.  Alfonso MARLA Buys, PharmD Pharmacy Resident  09/04/2023 11:06 AM

## 2023-09-04 NOTE — ED Notes (Signed)
 Pt reports 9/10 pain at the site of his colostomy. Colostomy bag intact at this time. Stoma still protruding and swollen at this time. Dr. Chipper Herb MD made aware.

## 2023-09-04 NOTE — ED Notes (Signed)
Noted bulging/prolapsed appearing stoma. Stoma red/pink in color. Drainage noted in bag consistent with stool.

## 2023-09-04 NOTE — ED Notes (Signed)
Pt ate broth and beverage, no issues, pt tolerated well.

## 2023-09-04 NOTE — ED Notes (Signed)
Report received from Jacob, RN 

## 2023-09-04 NOTE — ED Notes (Signed)
Family at bedside. 

## 2023-09-04 NOTE — Progress Notes (Signed)
 PROGRESS NOTE    Calvin [name]  FMW:[id number] DOB: Jun 22, 1951 DOA: 09/03/2023 PCP: Laurine Gladden, MD     Brief Narrative:   From admission h and p  Calvin [name] is a 72 y.o. 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   Symptoms started yesterday patient started to develop cramping-like abdominal pain, centrally located, associated with nauseous vomiting of stomach content, multiple times denies any blood or bile in the vomitus, no fever or chills.  Overnight patient started to have productive cough with light-colored phlegm no chest pains.   Assessment & Plan:   Principal Problem:   Acute respiratory failure with hypoxia (HCC) Active Problems:   Seizure (HCC)   Ogilvie syndrome   Paroxysmal atrial flutter (HCC)   Chronic diastolic CHF (congestive heart failure) (HCC)   Essential hypertension   CAD (coronary artery disease)   COPD (chronic obstructive pulmonary disease) (HCC)   History of CVA (cerebrovascular accident)   Sepsis (HCC)  # Flu A - continue tamiflu   # CAP LLL infiltrate vs edema. Aspiration risk, worth covering for that - will switch abx from cefepime /flagyl  (cefepime  is neurotoxic and the patient has a complex seizure history) to unasyn . Abx started 12/30  # Sepsis, severe By fever, tachycardia, elevated lactic acid. Hemodynamically stable - AM lactate pending - continue fluids - d/c midodrine   # Acute hypoxic respiratory failure 2/2 flu/cap, currently on 5 liters and satting well, no respiratory distress - wean [place] o2 as able  # SBO History ogilvie syndrome s/p colostomy, here with some vomiting, kub showing signs of obstruction. 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 Desiderio nothing to do here in the hospital, will need outpt f/u with Dr. Jordis - ostomy nurse consult  # COPD Some wheeze on exam today, ill as above - start prednisone   # AKI on ckd 2 Responding to fluids, baseline cr 1.2, was 2.48 on arrival, 1.6 today - continue IVF  # Lactic acidosis 2/2 above processes - continue IVF and trend lactate  # Seizure disorder With recent hospitalization for breakthrough seizures - continue keppra , add back home lamictal  and pregabalin   # hx CVA  With hemiplegia - cont home asa  # BPH  - cont home flomax   # HFpEF Dehydrated as above - judicious IV fluids  # Debility Resides at snf - PT consult  DVT prophylaxis: heparin  Code Status: full Family Communication: daughter stephanie updated telephonically 12/31  Level of care: Progressive Status is: Inpatient Remains inpatient appropriate because: severity of illness    Consultants:  Gen surg  Procedures: none  Antimicrobials:  See above    Subjective: Reports some cough, no pain  Objective: Vitals:   09/04/23 0500 09/04/23 0526 09/04/23 0600 09/04/23 0700  BP: 123/84  115/67 109/67  Pulse: 100  (!) 111 (!) 115  Resp: (!) 25  (!) 29 (!) 28  Temp:  98.8 F (37.1 C)    TempSrc:  Oral    SpO2: 94%  92% (!) 85%    Intake/Output Summary (Last 24 hours) at 09/04/2023 0901 Last data filed at 09/04/2023  9352 Gross per 24 hour  Intake 420 ml  Output 350 ml  Net 70 ml   There were no vitals filed for this visit.  Examination:  General exam: Appears calm and comfortable, chronically ill appearing Respiratory system: exp wheeze faint, scattered rhonchi, rales at base Cardiovascular system: S1 & S2 heard, tachycardic Gastrointestinal system: Abdomen is distended, large stoma prolapse, small amount  liquid stool in bag Central nervous system: Alert and oriented. Left sided weakness Extremities: warm, no edema Skin: No visible rashes, lesions or ulcers Psychiatry: Judgement and insight appear normal. Mood & affect appropriate.     Data Reviewed: I have personally reviewed following labs and imaging studies  CBC: Recent Labs  Lab 09/03/23 0308 09/04/23 0455  WBC 16.4* 12.8*  NEUTROABS 13.2*  --   HGB 12.9* 9.6*  HCT 42.5 31.9*  MCV 99.1 101.3*  PLT 372 210   Basic Metabolic Panel: Recent Labs  Lab 09/03/23 0308 09/04/23 0455  NA 134* 136  K 4.7 4.2  CL 96* 105  CO2 22 22  GLUCOSE 149* 96  BUN 14 19  CREATININE 2.48* 1.60*  CALCIUM  9.1 7.8*   GFR: CrCl cannot be calculated (Unknown ideal weight.). Liver Function Tests: Recent Labs  Lab 09/03/23 0308  AST 41  ALT 43  ALKPHOS 87  BILITOT 0.7  PROT 8.6*  ALBUMIN  3.9   No results for input(s): LIPASE, AMYLASE in the last 168 hours. No results for input(s): AMMONIA in the last 168 hours. Coagulation Profile: Recent Labs  Lab 09/03/23 0308  INR 1.1   Cardiac Enzymes: No results for input(s): CKTOTAL, CKMB, CKMBINDEX, TROPONINI in the last 168 hours. BNP (last 3 results) No results for input(s): PROBNP in the last 8760 hours. HbA1C: No results for input(s): HGBA1C in the last 72 hours. CBG: Recent Labs  Lab 09/03/23 1051  GLUCAP 95   Lipid Profile: No results for input(s): CHOL, HDL, LDLCALC, TRIG, CHOLHDL, LDLDIRECT in the last 72 hours. Thyroid  Function Tests: No results for input(s): TSH, T4TOTAL, FREET4, T3FREE, THYROIDAB in the last 72 hours. Anemia Panel: No results for input(s): VITAMINB12, FOLATE, FERRITIN, TIBC, IRON, RETICCTPCT in the last 72 hours. Urine analysis:    Component Value Date/Time   COLORURINE AMBER (A) 09/03/2023 0543   APPEARANCEUR CLOUDY (A) 09/03/2023 0543   APPEARANCEUR Clear 08/14/2014 0933   LABSPEC 1.026  09/03/2023 0543   LABSPEC 1.015 08/14/2014 0933   PHURINE 5.0 09/03/2023 0543   GLUCOSEU NEGATIVE 09/03/2023 0543   GLUCOSEU Negative 08/14/2014 0933   HGBUR NEGATIVE 09/03/2023 0543   BILIRUBINUR NEGATIVE 09/03/2023 0543   BILIRUBINUR Negative 08/14/2014 0933   KETONESUR NEGATIVE 09/03/2023 0543   PROTEINUR 30 (A) 09/03/2023 0543   UROBILINOGEN 0.2 06/12/2010 0103   NITRITE NEGATIVE 09/03/2023 0543   LEUKOCYTESUR LARGE (A) 09/03/2023 0543   LEUKOCYTESUR Negative 08/14/2014 0933   Sepsis Labs: @LABRCNTIP (procalcitonin:4,lacticidven:4)  ) Recent Results (from the past 240 hours)  Blood Culture (routine x 2)     Status: None (Preliminary result)   Collection Time: 09/03/23  3:08 AM   Specimen: BLOOD  Result Value Ref Range Status   Specimen Description BLOOD BLOOD RIGHT ARM  Final   Special Requests   Final    BOTTLES DRAWN AEROBIC AND ANAEROBIC Blood Culture results may not be optimal due to an inadequate volume of blood received in culture bottles   Culture   Final    NO GROWTH 1 DAY Performed at [facility], [address] [place],  [place] [address]    Report Status PENDING  Incomplete  Resp panel by RT-PCR (RSV, Flu A&B, Covid) Anterior Nasal Swab     Status: Abnormal   Collection Time: 09/03/23  3:18 AM   Specimen: Anterior Nasal Swab  Result Value Ref Range Status   SARS Coronavirus 2 by RT PCR NEGATIVE NEGATIVE Final    Comment: (NOTE) SARS-CoV-2 target nucleic acids are NOT DETECTED.  The SARS-CoV-2 RNA is generally detectable in upper respiratory specimens during the acute phase of infection. The lowest concentration of SARS-CoV-2 viral copies this assay can detect is 138 copies/mL. A negative result does not preclude SARS-Cov-2 infection and should not be used as the sole basis for treatment or other patient management decisions. A negative result may occur with  improper specimen collection/handling, submission of specimen other than nasopharyngeal  swab, presence of viral mutation(s) within the areas targeted by this assay, and inadequate number of viral copies(<138 copies/mL). A negative result must be combined with clinical observations, patient history, and epidemiological information. The expected result is Negative.  Fact Sheet for Patients:  bloggercourse.com  Fact Sheet for Healthcare Providers:  seriousbroker.it  This test is no t yet approved or cleared by the [place]  FDA and  has been authorized for detection and/or diagnosis of SARS-CoV-2 by FDA under an Emergency Use Authorization (EUA). This EUA will remain  in effect (meaning this test can be used) for the duration of the COVID-19 declaration under Section 564(b)(1) of the Act, 21 U.S.C.section 360bbb-3(b)(1), unless the authorization is terminated  or revoked sooner.       Influenza A by PCR POSITIVE (A) NEGATIVE Final   Influenza B by PCR NEGATIVE NEGATIVE Final    Comment: (NOTE) The Xpert Xpress SARS-CoV-2/FLU/RSV plus assay is intended as an aid in the diagnosis of influenza from Nasopharyngeal swab specimens and should not be used as a sole basis for treatment. Nasal washings and aspirates are unacceptable for Xpert Xpress SARS-CoV-2/FLU/RSV testing.  Fact Sheet for Patients: bloggercourse.com  Fact Sheet for Healthcare Providers: seriousbroker.it  This test is not yet approved or cleared by the [place]  FDA and has been authorized for detection and/or diagnosis of SARS-CoV-2 by FDA under an Emergency Use Authorization (EUA). This EUA will remain in effect (meaning this test can be used) for the duration of the COVID-19 declaration under Section 564(b)(1) of the Act, 21 U.S.C. section 360bbb-3(b)(1), unless the authorization is terminated or revoked.     Resp Syncytial Virus by PCR NEGATIVE NEGATIVE Final    Comment: (NOTE) Fact Sheet  for Patients: bloggercourse.com  Fact Sheet for Healthcare Providers: seriousbroker.it  This test is not yet approved or cleared by the [place]  FDA and has been authorized for detection and/or diagnosis of SARS-CoV-2 by FDA under an Emergency Use Authorization (EUA). This EUA will remain in effect (meaning this test can be used) for the duration of the COVID-19 declaration under Section 564(b)(1) of the Act, 21 U.S.C. section 360bbb-3(b)(1), unless the authorization is terminated or revoked.  Performed at [facility], [address] [place], [place] [address]   Blood Culture (routine x 2)     Status: None (Preliminary result)   Collection Time: 09/03/23  3:18 AM   Specimen: BLOOD  Result Value Ref Range Status   Specimen Description BLOOD RIGHT ANTECUBITAL  Final   Special Requests   Final    BOTTLES DRAWN AEROBIC AND ANAEROBIC Blood Culture results may not be optimal due to an inadequate volume of  blood received in culture bottles   Culture   Final    NO GROWTH 1 DAY Performed at [facility], [address] [place], [place] [address]    Report Status PENDING  Incomplete  MRSA Next Gen by PCR, Nasal     Status: Abnormal   Collection Time: 09/03/23 11:00 AM   Specimen: Nasal Mucosa; Nasal Swab  Result Value Ref Range Status   MRSA by PCR Next Gen DETECTED (A) NOT DETECTED Final    Comment: RESULT CALLED TO, READ BACK BY AND VERIFIED WITH: JACOB MOORE @1703  09/03/23 MJU (NOTE) The GeneXpert MRSA Assay (FDA approved for NASAL specimens only), is one component of a comprehensive MRSA colonization surveillance program. It is not intended to diagnose MRSA infection nor to guide or monitor treatment for MRSA infections. Test performance is not FDA approved in patients less than 68 years old. Performed at [facility], [address], [place], [place] [address]          Radiology  Studies: DG Chest 1 View Result Date: 09/04/2023 CLINICAL DATA:  Congestive heart failure. EXAM: CHEST  1 VIEW COMPARISON:  09/03/2023; 08/18/2023 FINDINGS: Grossly unchanged cardiac silhouette and mediastinal contours. Pulmonary vasculature appears less distinct than present examination with cephalization of flow. Progressive airspace opacities in the left lower lung. No definite pleural effusion or pneumothorax. No definite acute osseous abnormalities. IMPRESSION: Findings most suggestive of pulmonary edema with developing airspace opacity within the left lower lung, potentially alveolar pulmonary edema though conceivably infection/aspiration could have a similar appearance. Electronically Signed   By: Norleen Roulette M.D.   On: 09/04/2023 08:08   DG Abdomen 1 View Result Date: 09/03/2023 CLINICAL DATA:  72 year old male with abdominal distension. EXAM: ABDOMEN - 1 VIEW COMPARISON:  Abdominal radiographs 07/28/2023 and earlier. FINDINGS: Portable AP supine views at 0432 hours. Left abdominal colostomy redemonstrated. There is nondilated upstream large bowel from the colostomy with small volume of retained oral contrast. Dilated gas-filled small bowel loops elsewhere in the abdomen now up to 4 cm diameter and substantially increased since 07/28/2023. The appearance is similar to CT Abdomen and Pelvis 07/15/2023. Decompressed distal colon, distal to the ostomy. Aortoiliac and iliofemoral vascular calcifications. Stable visualized osseous structures. Left hip arthroplasty. No pneumoperitoneum evident on these supine views. IMPRESSION: Recurrent small-bowel ileus or small-bowel obstruction, similar to CT Abdomen and Pelvis 07/15/2023. Electronically Signed   By: VEAR Hurst M.D.   On: 09/03/2023 05:35   DG Chest Port 1 View Result Date: 09/03/2023 CLINICAL DATA:  Questionable sepsis - evaluate for abnormality Abdominal pain.  Respiratory distress. EXAM: PORTABLE CHEST 1 VIEW COMPARISON:  Radiograph 08/18/2023  FINDINGS: Rotated exam. Stable cardiomegaly. Unchanged mediastinal contours. Mild elevation of right hemidiaphragm. Ill-defined opacities developing at the left lung base. No pulmonary edema. No pneumothorax or pleural effusion. Remote left rib fractures. IMPRESSION: 1. Ill-defined opacities developing at the left lung base, suspicious for pneumonia. 2. Stable cardiomegaly. Electronically Signed   By: Andrea Gasman M.D.   On: 09/03/2023 03:34        Scheduled Meds:  aspirin   81 mg Oral Daily   baclofen   5 mg Oral BID   budesonide  (PULMICORT ) nebulizer solution  2 mg Nebulization Q12H   guaiFENesin   600 mg Oral Q12H   heparin   5,000 Units Subcutaneous Q12H   ipratropium-albuterol   3 mL Nebulization Q6H   lactulose   20 g Oral Daily   lamoTRIgine   50 mg Oral BID   melatonin  10 mg Oral QHS  midodrine   2.5 mg Oral TID WC   mometasone -formoterol   2 puff Inhalation BID   oseltamivir   30 mg Oral BID   pantoprazole   40 mg Oral Daily   polyethylene glycol  17 g Oral BID   pregabalin   200 mg Oral QHS   tamsulosin   0.4 mg Oral QPM   Continuous Infusions:  ceFEPime  (MAXIPIME ) IV Stopped (09/04/23 9352)   lactated ringers  100 mL/hr at 09/04/23 0128   levETIRAcetam  Stopped (09/04/23 0759)   metronidazole  Stopped (09/04/23 0425)     LOS: 1 day     Devaughn KATHEE Ban, MD [facility]   If 7PM-7AM, please contact night-coverage [email or web address] Password [email or web address] 09/04/2023, 9:01 AM

## 2023-09-04 NOTE — Progress Notes (Signed)
 Speech Language Pathology Treatment: Dysphagia  Patient Details Name: Calvin [name] MRN: [id number] DOB: [birth year] Today's Date: 09/04/2023 Time: 9169-9151 SLP Time Calculation (min) (ACUTE ONLY): 18 min  Assessment / Plan / Recommendation Clinical Impression  Patient seen for follow-up for dysphagia, more conversant with SLP and following commands, though somewhat irritable. Assessed with textures determined to be most appropriate per instrumental evaluation (FEES) during recent admission (dysphagia 1, nectar). Patient with desaturating during repositioning but once allowing for recovery, maintained Sp02 in low 90s throughout administration of POs. Nectar administered via straw, puree via teaspoon; patient required max assist for feeding. No overt signs of aspiration, however delayed throat clear noted, consistent with known laryngeal penetration occurring as documented on instrumental assessments. When pt cleared for POs per surgery, recommended texture is dysphagia 1 (puree), nectar thick liquids, meds crushed in puree, will need supervision/assistance for feeding.    HPI HPI: Calvin [name] is a 72 y.o. male with medical history significant of seizure disorder, CVA with residual left-sided hemiparesis, COPD, chronic HFpEF, CKD stage II, HTN, colon cancer status post transverse colectomy and colostomy, presented from nusring home after nausea and vomiting, pt was hypoxic and febrile and EMS was called. Arrived with saturations in 70s and was placed on 15L via NRB mask. DG abdomen 09/03/23 showed  Recurrent small-bowel ileus or small-bowel obstruction, similar to  CT Abdomen and Pelvis 07/15/2023. Patient has prior history of dysphagia, most recently on nectar/puree diet. During recent admission, FEES 07/20/23 penetrated liquids to glottis, absent epiglottic deflection, so PES opening with backflow, senses and ejects penetrate and aspirate and significant pharyngeal residue and harder to clear than  thin liqiuds, cleared with liquid wash, recommended full liquids per surgery and advance to puree when clear and pt is expected to cough a great deal.      SLP Plan  Continue with current plan of care      Recommendations for follow up therapy are one component of a multi-disciplinary discharge planning process, led by the attending physician.  Recommendations may be updated based on patient status, additional functional criteria and insurance authorization.    Recommendations  Diet recommendations: Dysphagia 1 (puree);Thin liquid (when cleared by surgery) Medication Administration: Crushed with puree Supervision: Staff to assist with self feeding;Full supervision/cueing for compensatory strategies Compensations: Slow rate;Small sips/bites Postural Changes and/or Swallow Maneuvers: Seated upright 90 degrees;Upright 30-60 min after meal                  Oral care QID   Frequent or constant Supervision/Assistance Dysphagia, unspecified (R13.10)     Continue with current plan of care    Ronal Landry [place], MS, CCC-SLP Speech-Language Pathologist [facility]: [phone]   Ronal FORBES [place]  09/04/2023, 9:08 AM

## 2023-09-04 NOTE — Progress Notes (Signed)
 09/04/2023  Subjective: No acute events.  Patient denies any nausea or vomiting.  Having liquid out of ostomy.  Reports some abdominal pain but his main complaint is back pain.  Vital signs: Temp:  [98.6 F (37 C)-100.2 F (37.9 C)] 99.5 F (37.5 C) (12/31 0917) Pulse Rate:  [91-119] 94 (12/31 0917) Resp:  [16-30] 26 (12/31 0917) BP: (81-155)/(55-88) 150/81 (12/31 0917) SpO2:  [85 %-100 %] 88 % (12/31 0917)   Intake/Output: 12/30 0701 - 12/31 0700 In: 420 [IV Piggyback:420] Out: 350 [Stool:350]    Physical Exam: Constitutional:  No acute distress Abdomen:  soft, distended, currently non-tender.  Ostomy with liquid stool, with significant prolapse but easily reducible and healthy mucosa.  Labs:  Recent Labs    09/03/23 0308 09/04/23 0455  WBC 16.4* 12.8*  HGB 12.9* 9.6*  HCT 42.5 31.9*  PLT 372 210   Recent Labs    09/03/23 0308 09/04/23 0455  NA 134* 136  K 4.7 4.2  CL 96* 105  CO2 22 22  GLUCOSE 149* 96  BUN 14 19  CREATININE 2.48* 1.60*  CALCIUM  9.1 7.8*   Recent Labs    09/03/23 0308  LABPROT 14.2  INR 1.1    Imaging: DG Chest 1 View Result Date: 09/04/2023 CLINICAL DATA:  Congestive heart failure. EXAM: CHEST  1 VIEW COMPARISON:  09/03/2023; 08/18/2023 FINDINGS: Grossly unchanged cardiac silhouette and mediastinal contours. Pulmonary vasculature appears less distinct than present examination with cephalization of flow. Progressive airspace opacities in the left lower lung. No definite pleural effusion or pneumothorax. No definite acute osseous abnormalities. IMPRESSION: Findings most suggestive of pulmonary edema with developing airspace opacity within the left lower lung, potentially alveolar pulmonary edema though conceivably infection/aspiration could have a similar appearance. Electronically Signed   By: Norleen Roulette M.D.   On: 09/04/2023 08:08    Assessment/Plan: This is a 72 y.o. male with dilated bowel and ostomy prolapse.  --Patient is doing  better from abdominal standpoint.  Denies any nausea or vomiting since yesterday.  There's still ostomy function despite of his distention.  I think the prolapse may be partially due to the persistent pressure of the dilated bowel and increased pressure from coughing.  Overall no urgent surgical intervention is needed at this point.  The prolapse is easily reducible, but protrudes right away too.  The mucosa is healthy.  He may need revision in the future, but given his current medical issues, would defer any evaluation for this to outpatient setting. --For now, will start clear liquid diet and check how he tolerates this.   I spent 35 minutes dedicated to the care of this patient on the date of this encounter to include pre-visit review of records, face-to-face time with the patient discussing diagnosis and management, and any post-visit coordination of care.  Aloysius Sheree Plant, MD [facility]

## 2023-09-04 NOTE — ED Notes (Signed)
 Brief changed and chucks replaced. Patient repositioned. No complaints.

## 2023-09-05 ENCOUNTER — Inpatient Hospital Stay: Payer: Medicare Other

## 2023-09-05 DIAGNOSIS — J9601 Acute respiratory failure with hypoxia: Secondary | ICD-10-CM | POA: Diagnosis not present

## 2023-09-05 LAB — BASIC METABOLIC PANEL
Anion gap: 9 (ref 5–15)
BUN: 17 mg/dL (ref 8–23)
CO2: 23 mmol/L (ref 22–32)
Calcium: 7.8 mg/dL — ABNORMAL LOW (ref 8.9–10.3)
Chloride: 105 mmol/L (ref 98–111)
Creatinine, Ser: 1.25 mg/dL — ABNORMAL HIGH (ref 0.61–1.24)
GFR, Estimated: >60 mL/min (ref >60)
Glucose, Bld: 109 mg/dL — ABNORMAL HIGH (ref 70–99)
Potassium: 4.9 mmol/L (ref 3.5–5.1)
Sodium: 137 mmol/L (ref 135–145)

## 2023-09-05 LAB — CBC
HCT: 27.6 % — ABNORMAL LOW (ref 39.0–52.0)
Hemoglobin: 8.4 g/dL — ABNORMAL LOW (ref 13.0–17.0)
MCH: 30 pg (ref 26.0–34.0)
MCHC: 30.4 g/dL (ref 30.0–36.0)
MCV: 98.6 fL (ref 80.0–100.0)
Platelets: 185 10*3/uL (ref 150–400)
RBC: 2.8 MIL/uL — ABNORMAL LOW (ref 4.22–5.81)
RDW: 14.1 % (ref 11.5–15.5)
WBC: 9.1 10*3/uL (ref 4.0–10.5)
nRBC: 0 % (ref 0.0–0.2)

## 2023-09-05 LAB — URINE CULTURE

## 2023-09-05 LAB — LACTIC ACID, PLASMA: Lactic Acid, Venous: 1.9 mmol/L (ref 0.5–1.9)

## 2023-09-05 LAB — FOLATE: Folate: 7.3 ng/mL (ref >5.9)

## 2023-09-05 MED ORDER — HYDROMORPHONE HCL 1 MG/ML IJ SOLN
1.0000 mg | INTRAMUSCULAR | Status: DC | PRN
  Administered 2023-09-05: 2 mg via INTRAVENOUS
  Administered 2023-09-06: 1 mg via INTRAVENOUS
  Administered 2023-09-06 – 2023-09-07 (×4): 2 mg via INTRAVENOUS
  Administered 2023-09-07 – 2023-09-10 (×4): 1 mg via INTRAVENOUS
  Filled 2023-09-05: qty 2
  Filled 2023-09-05: qty 1
  Filled 2023-09-05 (×2): qty 2
  Filled 2023-09-05 (×2): qty 1
  Filled 2023-09-05: qty 2
  Filled 2023-09-05: qty 1
  Filled 2023-09-05 (×2): qty 2

## 2023-09-05 MED ORDER — DEXTROSE-SODIUM CHLORIDE 5-0.45 % IV SOLN
INTRAVENOUS | Status: AC
Start: 2023-09-05 — End: 2023-09-07

## 2023-09-05 MED ORDER — IPRATROPIUM-ALBUTEROL 0.5-2.5 (3) MG/3ML IN SOLN
3.0000 mL | Freq: Two times a day (BID) | RESPIRATORY_TRACT | Status: DC
  Administered 2023-09-05 – 2023-09-15 (×19): 3 mL via RESPIRATORY_TRACT
  Filled 2023-09-05 (×19): qty 3

## 2023-09-05 MED ORDER — BUDESONIDE 0.25 MG/2ML IN SUSP
0.2500 mg | Freq: Two times a day (BID) | RESPIRATORY_TRACT | Status: DC
  Administered 2023-09-05 – 2023-09-26 (×40): 0.25 mg via RESPIRATORY_TRACT
  Filled 2023-09-05 (×42): qty 2

## 2023-09-05 MED ORDER — MORPHINE SULFATE (PF) 2 MG/ML IV SOLN
2.0000 mg | INTRAVENOUS | Status: DC | PRN
  Administered 2023-09-05 – 2023-09-10 (×14): 2 mg via INTRAVENOUS
  Filled 2023-09-05 (×15): qty 1

## 2023-09-05 MED ORDER — PANTOPRAZOLE SODIUM 40 MG IV SOLR
40.0000 mg | Freq: Two times a day (BID) | INTRAVENOUS | Status: DC
  Administered 2023-09-05 – 2023-09-20 (×29): 40 mg via INTRAVENOUS
  Filled 2023-09-05 (×29): qty 10

## 2023-09-05 MED ORDER — METOCLOPRAMIDE HCL 5 MG/ML IJ SOLN
10.0000 mg | Freq: Four times a day (QID) | INTRAMUSCULAR | Status: DC | PRN
  Administered 2023-09-05 – 2023-09-20 (×5): 10 mg via INTRAVENOUS
  Filled 2023-09-05 (×5): qty 2

## 2023-09-05 NOTE — Plan of Care (Signed)

## 2023-09-05 NOTE — Progress Notes (Signed)
 PROGRESS NOTE    Calvin [name]  FMW:[id number] DOB: 03-Feb-1951 DOA: 09/03/2023 PCP: Laurine Gladden, MD     Brief Narrative:   From admission h and p  Calvin [name] is a 73 y.o. 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   Symptoms started yesterday patient started to develop cramping-like abdominal pain, centrally located, associated with nauseous vomiting of stomach content, multiple times denies any blood or bile in the vomitus, no fever or chills.  Overnight patient started to have productive cough with light-colored phlegm no chest pains.   Assessment & Plan:   Principal Problem:   Acute respiratory failure with hypoxia (HCC) Active Problems:   Seizure (HCC)   Ogilvie syndrome   Paroxysmal atrial flutter (HCC)   Chronic diastolic CHF (congestive heart failure) (HCC)   Essential hypertension   CAD (coronary artery disease)   COPD (chronic obstructive pulmonary disease) (HCC)   History of CVA (cerebrovascular accident)   Sepsis (HCC)  # Flu A - continue tamiflu   # CAP LLL infiltrate vs edema. Aspiration risk, worth covering for that - cefepme/flagyl >unasyn , Abx started 12/30  # Sepsis, severe By fever, tachycardia, elevated lactic acid. Hemodynamically stable. Lactate normalized - continue fluids  # Acute hypoxic respiratory failure 2/2 flu/cap, currently on 3 liters and satting well down from 5 yesterday, no respiratory distress - wean [place] o2 as able  # SBO History ogilvie syndrome s/p colostomy, here with some vomiting, kub showing signs of obstruction. Gen surg following. Several episodes emesis today - NG tube placed, gen surg managing  # Anemia Hgb has drifted from  baseline of 11-12 to 8.4 today, ng tube with dark fluid concerning for bleeding - bid ppi - stop asa/heparin  - discussed with GI Dr. 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 Desiderio nothing to do here in the hospital, will need outpt f/u with Dr. Jordis - ostomy nurse consult  # COPD Some wheeze  , ill as above - cont prednisone   # AKI on ckd 2 Responding to fluids, baseline cr 1.2, was 2.48 on arrival, 1.25 today - continue IVF while npo  # Lactic acidosis 2/2 above processes, resolved - continue IVF   # Seizure disorder With recent hospitalization for breakthrough seizures - continue keppra , add back home lamictal  and pregabalin   # hx CVA  With hemiplegia - hold asa given anemia  # BPH  - cont home flomax   # HFpEF Dehydrated as above - judicious IV fluids  # Debility Resides at snf - PT consulted  DVT prophylaxis: heparin  Code Status: full Family Communication: daughter stephanie updated telephonically 1/1  Level of care: Telemetry Medical Status is: Inpatient Remains inpatient appropriate because: severity of illness    Consultants:  Gen surg  Procedures: none  Antimicrobials:  See above    Subjective: Several episodes vomiting today  Objective: Vitals:   09/05/23 0500 09/05/23 0723 09/05/23 0824 09/05/23 1313  BP: 130/77  134/77 (!) 142/82  Pulse: 90  (!) 110 (!) 103  Resp: 16  19 20   Temp: 98.4  F (36.9 C)  98.2 F (36.8 C) 98 F (36.7 C)  TempSrc: Oral     SpO2: 95% 97% 96% 97%  Weight:      Height:        Intake/Output Summary (Last 24 hours) at 09/05/2023 1531 Last data filed at 09/05/2023 1528 Gross per 24 hour  Intake 640 ml  Output 1475 ml  Net -835 ml   Filed Weights   09/04/23 1347 09/04/23 1701  Weight:  97.8 kg 93.1 kg    Examination:  General exam: Appears calm and comfortable, chronically ill appearing Respiratory system: exp wheeze faint, scattered rhonchi, rales at base Cardiovascular system: S1 & S2 heard, tachycardic Gastrointestinal system: Abdomen is distended, large stoma prolapse, small amount brown liquid stool in bag. Ng tube with dark liquid fluid Central nervous system: Alert and oriented. Left sided weakness Extremities: warm, no edema Skin: No visible rashes, lesions or ulcers Psychiatry: Judgement and insight appear normal. Mood & affect appropriate.     Data Reviewed: I have personally reviewed following labs and imaging studies  CBC: Recent Labs  Lab 09/03/23 0308 09/04/23 0455 09/05/23 0427  WBC 16.4* 12.8* 9.1  NEUTROABS 13.2*  --   --   HGB 12.9* 9.6* 8.4*  HCT 42.5 31.9* 27.6*  MCV 99.1 101.3* 98.6  PLT 372 210 185   Basic Metabolic Panel: Recent Labs  Lab 09/03/23 0308 09/04/23 0455 09/05/23 0427  NA 134* 136 137  K 4.7 4.2 4.9  CL 96* 105 105  CO2 22 22 23   GLUCOSE 149* 96 109*  BUN 14 19 17   CREATININE 2.48* 1.60* 1.25*  CALCIUM  9.1 7.8* 7.8*   GFR: Estimated Creatinine Clearance: 60.4 mL/min (A) (by C-G formula based on SCr of 1.25 mg/dL (H)). Liver Function Tests: Recent Labs  Lab 09/03/23 0308  AST 41  ALT 43  ALKPHOS 87  BILITOT 0.7  PROT 8.6*  ALBUMIN  3.9   No results for input(s): LIPASE, AMYLASE in the last 168 hours. No results for input(s): AMMONIA in the last 168 hours. Coagulation Profile: Recent Labs  Lab 09/03/23 0308  INR 1.1   Cardiac Enzymes: No results for input(s): CKTOTAL, CKMB, CKMBINDEX, TROPONINI in the last 168 hours. BNP (last 3 results) No results for input(s): PROBNP in the last 8760 hours. HbA1C: No results for input(s): HGBA1C in the last 72 hours. CBG: Recent Labs  Lab 09/03/23 1051  GLUCAP 95   Lipid Profile: No results for input(s): CHOL, HDL, LDLCALC,  TRIG, CHOLHDL, LDLDIRECT in the last 72 hours. Thyroid  Function Tests: No results for input(s): TSH, T4TOTAL, FREET4, T3FREE, THYROIDAB in the last 72 hours. Anemia Panel: No results for input(s): VITAMINB12, FOLATE, FERRITIN, TIBC, IRON, RETICCTPCT in the last 72 hours. Urine analysis:    Component Value Date/Time   COLORURINE AMBER (A) 09/03/2023 0543   APPEARANCEUR CLOUDY (A) 09/03/2023 0543   APPEARANCEUR Clear 08/14/2014 0933   LABSPEC 1.026 09/03/2023 0543   LABSPEC 1.015 08/14/2014 0933   PHURINE 5.0 09/03/2023 0543   GLUCOSEU NEGATIVE 09/03/2023 0543   GLUCOSEU Negative 08/14/2014 0933   HGBUR NEGATIVE 09/03/2023 0543   BILIRUBINUR NEGATIVE 09/03/2023 0543   BILIRUBINUR Negative 08/14/2014 0933   KETONESUR NEGATIVE 09/03/2023 0543   PROTEINUR 30 (A) 09/03/2023 0543   UROBILINOGEN 0.2 06/12/2010 0103   NITRITE NEGATIVE 09/03/2023 0543   LEUKOCYTESUR LARGE (A) 09/03/2023 0543   LEUKOCYTESUR Negative 08/14/2014 0933   Sepsis Labs: @LABRCNTIP (procalcitonin:4,lacticidven:4)  ) Recent Results (from the past 240 hours)  Blood  Culture (routine x 2)     Status: None (Preliminary result)   Collection Time: 09/03/23  3:08 AM   Specimen: BLOOD  Result Value Ref Range Status   Specimen Description BLOOD BLOOD RIGHT ARM  Final   Special Requests   Final    BOTTLES DRAWN AEROBIC AND ANAEROBIC Blood Culture results may not be optimal due to an inadequate volume of blood received in culture bottles   Culture   Final    NO GROWTH 2 DAYS Performed at [facility], [address] [place], [place] [address]    Report Status PENDING  Incomplete  Resp panel by RT-PCR (RSV, Flu A&B, Covid) Anterior Nasal Swab     Status: Abnormal   Collection Time: 09/03/23  3:18 AM   Specimen: Anterior Nasal Swab  Result Value Ref Range Status   SARS Coronavirus 2 by RT PCR NEGATIVE NEGATIVE Final    Comment: (NOTE) SARS-CoV-2 target nucleic acids are NOT  DETECTED.  The SARS-CoV-2 RNA is generally detectable in upper respiratory specimens during the acute phase of infection. The lowest concentration of SARS-CoV-2 viral copies this assay can detect is 138 copies/mL. A negative result does not preclude SARS-Cov-2 infection and should not be used as the sole basis for treatment or other patient management decisions. A negative result may occur with  improper specimen collection/handling, submission of specimen other than nasopharyngeal swab, presence of viral mutation(s) within the areas targeted by this assay, and inadequate number of viral copies(<138 copies/mL). A negative result must be combined with clinical observations, patient history, and epidemiological information. The expected result is Negative.  Fact Sheet for Patients:  bloggercourse.com  Fact Sheet for Healthcare Providers:  seriousbroker.it  This test is no t yet approved or cleared by the [place]  FDA and  has been authorized for detection and/or diagnosis of SARS-CoV-2 by FDA under an Emergency Use Authorization (EUA). This EUA will remain  in effect (meaning this test can be used) for the duration of the COVID-19 declaration under Section 564(b)(1) of the Act, 21 U.S.C.section 360bbb-3(b)(1), unless the authorization is terminated  or revoked sooner.       Influenza A by PCR POSITIVE (A) NEGATIVE Final   Influenza B by PCR NEGATIVE NEGATIVE Final    Comment: (NOTE) The Xpert Xpress SARS-CoV-2/FLU/RSV plus assay is intended as an aid in the diagnosis of influenza from Nasopharyngeal swab specimens and should not be used as a sole basis for treatment. Nasal washings and aspirates are unacceptable for Xpert Xpress SARS-CoV-2/FLU/RSV testing.  Fact Sheet for Patients: bloggercourse.com  Fact Sheet for Healthcare Providers: seriousbroker.it  This test is not  yet approved or cleared by the [place]  FDA and has been authorized for detection and/or diagnosis of SARS-CoV-2 by FDA under an Emergency Use Authorization (EUA). This EUA will remain in effect (meaning this test can be used) for the duration of the COVID-19 declaration under Section 564(b)(1) of the Act, 21 U.S.C. section 360bbb-3(b)(1), unless the authorization is terminated or revoked.     Resp Syncytial Virus by PCR NEGATIVE NEGATIVE Final    Comment: (NOTE) Fact Sheet for Patients: bloggercourse.com  Fact Sheet for Healthcare Providers: seriousbroker.it  This test is not yet approved or cleared by the [place]  FDA and has been authorized for detection and/or diagnosis of SARS-CoV-2 by FDA under an Emergency Use Authorization (EUA). This EUA will remain in effect (meaning this test can be used) for the duration of the COVID-19 declaration under Section 564(b)(1) of  the Act, 21 U.S.C. section 360bbb-3(b)(1), unless the authorization is terminated or revoked.  Performed at [facility], [address] [place], [place] [address]   Blood Culture (routine x 2)     Status: None (Preliminary result)   Collection Time: 09/03/23  3:18 AM   Specimen: BLOOD  Result Value Ref Range Status   Specimen Description BLOOD RIGHT ANTECUBITAL  Final   Special Requests   Final    BOTTLES DRAWN AEROBIC AND ANAEROBIC Blood Culture results may not be optimal due to an inadequate volume of blood received in culture bottles   Culture   Final    NO GROWTH 2 DAYS Performed at [facility], [address], [place], [place] [address]    Report Status PENDING  Incomplete  Urine Culture     Status: Abnormal   Collection Time: 09/03/23  5:43 AM   Specimen: Urine, Random  Result Value Ref Range Status   Specimen Description   Final    URINE, RANDOM Performed at [facility], [address] [place], [place]  [address]    Special Requests   Final    NONE Reflexed from [id number] Performed at [facility], [address] [place], [place] [address]    Culture 30,000 COLONIES/mL STAPHYLOCOCCUS AUREUS (A)  Final   Report Status 09/05/2023 FINAL  Final   Organism ID, Bacteria STAPHYLOCOCCUS AUREUS (A)  Final      Susceptibility   Staphylococcus aureus - MIC*    CIPROFLOXACIN  >=8 RESISTANT Resistant     GENTAMICIN <=0.5 SENSITIVE Sensitive     NITROFURANTOIN <=16 SENSITIVE Sensitive     OXACILLIN >=4 RESISTANT Resistant     TETRACYCLINE <=1 SENSITIVE Sensitive     VANCOMYCIN  1 SENSITIVE Sensitive     TRIMETH/SULFA >=320 RESISTANT Resistant     RIFAMPIN <=0.5 SENSITIVE Sensitive     Inducible Clindamycin  NEGATIVE Sensitive     LINEZOLID 2 SENSITIVE Sensitive     * 30,000 COLONIES/mL STAPHYLOCOCCUS AUREUS  MRSA Next Gen by PCR, Nasal     Status: Abnormal   Collection Time: 09/03/23 11:00 AM   Specimen: Nasal Mucosa; Nasal Swab  Result Value Ref Range Status   MRSA by PCR Next Gen DETECTED (A) NOT DETECTED Final    Comment: RESULT CALLED TO, READ BACK BY AND VERIFIED WITH: JACOB MOORE @1703  09/03/23 MJU (NOTE) The GeneXpert MRSA Assay (FDA approved for NASAL specimens only), is one component of a comprehensive MRSA colonization surveillance program. It is not intended to diagnose MRSA infection nor to guide or monitor treatment for MRSA infections. Test performance is not FDA approved in patients less than 33 years old. Performed at [facility], [address], [place], [place] [address]          Radiology Studies: DG Chest 1 View Result Date: 09/04/2023 CLINICAL DATA:  Congestive heart failure. EXAM: CHEST  1 VIEW COMPARISON:  09/03/2023; 08/18/2023 FINDINGS: Grossly unchanged cardiac silhouette and mediastinal contours. Pulmonary vasculature appears less distinct than present examination with cephalization of flow. Progressive airspace opacities in the left lower  lung. No definite pleural effusion or pneumothorax. No definite acute osseous abnormalities. IMPRESSION: Findings most suggestive of pulmonary edema with developing airspace opacity within the left lower lung, potentially alveolar pulmonary edema though conceivably infection/aspiration could have a similar appearance. Electronically Signed   By: Norleen Roulette M.D.   On: 09/04/2023 08:08        Scheduled Meds:  aspirin   81 mg Oral Daily  baclofen   5 mg Oral BID   budesonide  (PULMICORT ) nebulizer solution  0.25 mg Nebulization BID   guaiFENesin   600 mg Oral Q12H   heparin   5,000 Units Subcutaneous Q12H   ipratropium-albuterol   3 mL Inhalation BID   lactulose   20 g Oral Daily   lamoTRIgine   50 mg Oral BID   melatonin  10 mg Oral QHS   mometasone -formoterol   2 puff Inhalation BID   oseltamivir   30 mg Oral BID   pantoprazole   40 mg Oral Daily   polyethylene glycol  17 g Oral BID   predniSONE   40 mg Oral Q breakfast   pregabalin   200 mg Oral QHS   tamsulosin   0.4 mg Oral QPM   Continuous Infusions:  ampicillin -sulbactam (UNASYN ) IV 3 g (09/05/23 1153)   dextrose  5 % and 0.45 % NaCl     levETIRAcetam  1,000 mg (09/05/23 0854)     LOS: 2 days     Devaughn KATHEE Ban, MD [facility]   If 7PM-7AM, please contact night-coverage [email or web address] Password [email or web address] 09/05/2023, 3:31 PM

## 2023-09-05 NOTE — TOC Initial Note (Signed)
 Transition of Care [facility]) - Initial/Assessment Note    Patient Details  Name: Calvin [name] MRN: [id number] Date of Birth: [birth year]  Transition of Care [facility]) CM/SW Contact:    Ladene Lady, LCSW Phone Number: 09/05/2023, 10:42 AM  Clinical Narrative:  Pt admitted with acute respiratory failure from [facility]. CSW will continue to follow for care plan updates and needs.              Expected Discharge Plan: Long Term Nursing Home Barriers to Discharge: Continued Medical Work up   Patient Goals and CMS Choice            Expected Discharge Plan and Services                                              Prior Living Arrangements/Services     Patient language and need for interpreter reviewed:: Yes                 Activities of Daily Living   ADL Screening (condition at time of admission) Independently performs ADLs?: No Does the patient have a NEW difficulty with bathing/dressing/toileting/self-feeding that is expected to last >3 days?: No Does the patient have a NEW difficulty with getting in/out of bed, walking, or climbing stairs that is expected to last >3 days?: No Does the patient have a NEW difficulty with communication that is expected to last >3 days?: No Is the patient deaf or have difficulty hearing?: No Does the patient have difficulty seeing, even when wearing glasses/contacts?: No Does the patient have difficulty concentrating, remembering, or making decisions?: No  Permission Sought/Granted         Permission granted to share info w AGENCY: [facility]  Permission granted to share info w Relationship: daughter     Emotional Assessment       Orientation: : Oriented to Self, Oriented to Place, Oriented to  Time, Oriented to Situation   Psych Involvement: No (comment)  Admission diagnosis:  Influenza A [J10.1] Acute respiratory failure with hypoxia (HCC) [J96.01] Sepsis (HCC) [A41.9] Aspiration into airway, initial encounter  [T17.908A] Sepsis, due to unspecified organism, unspecified whether acute organ dysfunction present [facility]) [A41.9] Patient Active Problem List   Diagnosis Date Noted   Cancer of sigmoid (HCC) 09/04/2023   Gout 09/04/2023   Acute respiratory failure with hypoxia (HCC) 09/03/2023   Acute metabolic encephalopathy 08/19/2023   History of CVA (cerebrovascular accident) 08/19/2023   Thrombocytopenia (HCC) 08/19/2023   Hypotension 07/15/2023   Small bowel obstruction (HCC) 07/15/2023   Hemiplegia and 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 side (HCC)    Temporal pain 06/18/2022   Acute kidney injury superimposed on CKD (HCC) 06/17/2022   Dysphagia 06/07/2022   Respiratory distress 06/02/2022   Neuropathy 06/02/2022   Paroxysmal atrial flutter (HCC) 06/01/2022   Aspiration pneumonia (HCC) 06/01/2022   Cervical spinal stenosis 05/31/2022   Chronic diastolic CHF (congestive heart failure) (HCC) 05/28/2022   SBO, recurrent (small bowel obstruction) (HCC)    Abdominal distention    HLD (hyperlipidemia) 10/16/2021   Nausea vomiting and diarrhea 10/16/2021   Sepsis (HCC) 10/16/2021   Tobacco abuse 10/16/2021   Muscle twitching 08/14/2019   UTI (urinary tract infection) 08/03/2019   Ileus (HCC) 08/03/2019   Hypokalemia 08/03/2019   QT prolongation 08/03/2019   Ogilvie syndrome    Acute abdominal pain 04/03/2019  Left-sided weakness 09/30/2017   Seizure (HCC) 09/30/2017   Essential hypertension 09/30/2017   CAD (coronary artery disease) 09/30/2017   COPD (chronic obstructive pulmonary disease) (HCC) 09/30/2017   Depression with anxiety 09/30/2017   PCP:  Laurine Gladden, MD Pharmacy:   [facility] [address]), [place] - [address] [address]AC[place] [place]) [place] [address] Phone: [phone] Fax: [phone]  [facility] [address] [place] [address] Phone:  [phone] Fax: [phone]     Social Drivers of Health (SDOH) Social History: SDOH Screenings   Food Insecurity: No Food Insecurity (09/04/2023)  Housing: Low Risk  (09/04/2023)  Transportation Needs: No Transportation Needs (09/04/2023)  Utilities: Not At Risk (09/04/2023)  Tobacco Use: Medium Risk (09/03/2023)   SDOH Interventions:     Readmission Risk Interventions    08/20/2023   12:32 PM 07/17/2023   12:24 PM 06/30/2022    2:25 PM  Readmission Risk Prevention Plan  Transportation Screening Complete Complete Complete  PCP or Specialist Appt within 3-5 Days  Complete   Social Work Consult for Recovery Care Planning/Counseling  Complete   Palliative Care Screening  Not Applicable   Medication Review [employer]) Complete Complete   PCP or Specialist appointment within 3-5 days of discharge Complete    HRI or Home Care Consult Complete  Complete  SW Recovery Care/Counseling Consult Complete  Complete  Palliative Care Screening Not Applicable  Complete  Skilled Nursing Facility Complete  Complete

## 2023-09-05 NOTE — Progress Notes (Addendum)
 Per Tonna Boehringer DO, pull ng tube back 2 cm and hook to LIWS.

## 2023-09-05 NOTE — Progress Notes (Signed)
 Subjective:  CC: Calvin [name] is a 73 y.o. male  Hospital stay day 2,   dilated bowel and ostomy prolapse  HPI: he reports feeling sputum buildup in the back of his throat and the need to clear it. He specifically denies nausea or increasing abdominal pain.  ROS:  General: Denies weight loss, weight gain, fatigue, fevers, chills, and night sweats. Heart: Denies chest pain, palpitations, racing heart, irregular heartbeat, leg pain or swelling, and decreased activity tolerance. Respiratory: Denies breathing difficulty, shortness of breath, wheezing, cough, and sputum. GI: Denies change in appetite, heartburn, nausea, vomiting, constipation, diarrhea, and blood in stool. GU: Denies difficulty urinating, pain with urinating, urgency, frequency, blood in urine.   Objective:   Temp:  [97.6 F (36.4 C)-99.3 F (37.4 C)] 98.2 F (36.8 C) (01/01 0824) Pulse Rate:  [76-120] 110 (01/01 0824) Resp:  [16-28] 19 (01/01 0824) BP: (103-134)/(52-79) 134/77 (01/01 0824) SpO2:  [91 %-99 %] 96 % (01/01 0824) Weight:  [93.1 kg-97.8 kg] 93.1 kg (12/31 1701)     Height: 5' 11 (180.3 cm) Weight: 93.1 kg BMI (Calculated): 28.64   Intake/Output this shift:   Intake/Output Summary (Last 24 hours) at 09/05/2023 1050 Last data filed at 09/05/2023 0600 Gross per 24 hour  Intake 640 ml  Output 675 ml  Net -35 ml    Constitutional :  alert, cooperative, appears stated age, and no distress  Respiratory:  clear to auscultation bilaterally  Cardiovascular:  regular rate and rhythm  Gastrointestinal: Soft, no guarding, distention at baseline.  Large ostomy prolapse that is reducible with gentle pressure.  Stool present within bag as well .   Skin: Cool and moist.   Psychiatric: Normal affect, non-agitated, not confused       LABS:     Latest Ref Rng & Units 09/05/2023    4:27 AM 09/04/2023    4:55 AM 09/03/2023    3:08 AM  CMP  Glucose 70 - 99 mg/dL 890  96  850   BUN 8 - 23 mg/dL 17  19  14     Creatinine 0.61 - 1.24 mg/dL 8.74  8.39  7.51   Sodium 135 - 145 mmol/L 137  136  134   Potassium 3.5 - 5.1 mmol/L 4.9  4.2  4.7   Chloride 98 - 111 mmol/L 105  105  96   CO2 22 - 32 mmol/L 23  22  22    Calcium  8.9 - 10.3 mg/dL 7.8  7.8  9.1   Total Protein 6.5 - 8.1 g/dL   8.6   Total Bilirubin <1.2 mg/dL   0.7   Alkaline Phos 38 - 126 U/L   87   AST 15 - 41 U/L   41   ALT 0 - 44 U/L   43       Latest Ref Rng & Units 09/05/2023    4:27 AM 09/04/2023    4:55 AM 09/03/2023    3:08 AM  CBC  WBC 4.0 - 10.5 K/uL 9.1  12.8  16.4   Hemoglobin 13.0 - 17.0 g/dL 8.4  9.6  87.0   Hematocrit 39.0 - 52.0 % 27.6  31.9  42.5   Platelets 150 - 400 K/uL 185  210  372     RADS: N/a Assessment:   dilated bowel and ostomy prolapse.  Prolapse continues to remain reducible.  Dilated bowel noted on previous imaging likely baseline since patient continues to have stooling in bag.  Recommend continuing care for his respiratory  issues to minimize coughing episodes per primary team.  Recommend back to n.p.o sips for meds. for now until coughing episodes and sputum production decreases to minimize risk of aspiration.  labs/images/medications/previous chart entries reviewed personally and relevant changes/updates noted above.

## 2023-09-05 DEATH — deceased

## 2023-09-06 ENCOUNTER — Inpatient Hospital Stay: Payer: Medicare Other

## 2023-09-06 DIAGNOSIS — J9601 Acute respiratory failure with hypoxia: Secondary | ICD-10-CM | POA: Diagnosis not present

## 2023-09-06 DIAGNOSIS — K5981 Ogilvie syndrome: Secondary | ICD-10-CM | POA: Diagnosis not present

## 2023-09-06 LAB — CBC
HCT: 28.9 % — ABNORMAL LOW (ref 39.0–52.0)
Hemoglobin: 8.8 g/dL — ABNORMAL LOW (ref 13.0–17.0)
MCH: 29.7 pg (ref 26.0–34.0)
MCHC: 30.4 g/dL (ref 30.0–36.0)
MCV: 97.6 fL (ref 80.0–100.0)
Platelets: 192 10*3/uL (ref 150–400)
RBC: 2.96 MIL/uL — ABNORMAL LOW (ref 4.22–5.81)
RDW: 14.1 % (ref 11.5–15.5)
WBC: 6.2 10*3/uL (ref 4.0–10.5)
nRBC: 0.3 % — ABNORMAL HIGH (ref 0.0–0.2)

## 2023-09-06 LAB — BASIC METABOLIC PANEL
Anion gap: 9 (ref 5–15)
BUN: 15 mg/dL (ref 8–23)
CO2: 25 mmol/L (ref 22–32)
Calcium: 7.7 mg/dL — ABNORMAL LOW (ref 8.9–10.3)
Chloride: 103 mmol/L (ref 98–111)
Creatinine, Ser: 1.06 mg/dL (ref 0.61–1.24)
GFR, Estimated: >60 mL/min (ref >60)
Glucose, Bld: 108 mg/dL — ABNORMAL HIGH (ref 70–99)
Potassium: 4 mmol/L (ref 3.5–5.1)
Sodium: 137 mmol/L (ref 135–145)

## 2023-09-06 LAB — VITAMIN B12: Vitamin B-12: 395 pg/mL (ref 180–914)

## 2023-09-06 MED ORDER — IOHEXOL 300 MG/ML  SOLN
100.0000 mL | Freq: Once | INTRAMUSCULAR | Status: AC | PRN
Start: 2023-09-06 — End: 2023-09-06
  Administered 2023-09-06: 100 mL via INTRAVENOUS

## 2023-09-06 MED ORDER — BENZONATATE 100 MG PO CAPS
200.0000 mg | ORAL_CAPSULE | Freq: Three times a day (TID) | ORAL | Status: DC | PRN
  Administered 2023-09-06 – 2023-09-12 (×5): 200 mg via ORAL
  Filled 2023-09-06 (×5): qty 2

## 2023-09-06 MED ORDER — IOHEXOL 9 MG/ML PO SOLN
500.0000 mL | ORAL | Status: AC
  Administered 2023-09-06 (×2): 500 mL via ORAL

## 2023-09-06 NOTE — Care Management Important Message (Signed)
 Important Message  Patient Details  Name: Calvin [name] MRN: [id number] Date of Birth: 02/02/1951   Important Message Given:  Yes - Medicare IM     Cristela Blue, CMA 09/06/2023, 12:28 PM

## 2023-09-06 NOTE — Evaluation (Signed)
 Physical Therapy Evaluation Patient Details Name: Calvin [name] MRN: [id number] DOB: 1950-12-16 Today's Date: 09/06/2023  History of Present Illness  Pt is a 73 y.o. male presenting to hospital from nursing facility for abdominal pain, N/V, fever, and hypoxia.  Pt admitted with severe sepsis, acute hypoxic respiratory failure, SBO, AKI on CKD stage II, colostomy prolapse.  (+) Flu.  PMH includes h/o CVA with L sided deficitis, COPD, colostomy, seizure disorder, chronic combined HFrEF and HFpEF, CKD, htn, colon CA s/p transverse colectomy and colostomy, L partial hip replacement, throat sx (d/t CA), L CTR.  Clinical Impression  Prior to recent medical concerns, pt reports transferring with assist bed to w/c but used lift to transfer back to bed; propelled w/c in room with UE's but required assist for longer distances; participating in physical therapy.  Pt oriented to person, hospital, general situation, and month/year (pt reported it was the 1st).  Currently pt is max assist logrolling to L in bed.  Limited activity d/t abdominal pain 9/10 (nurse notified), SOB, and fatigue with activity.  Pt would currently benefit from skilled PT to address noted impairments and functional limitations (see below for any additional details).  Upon hospital discharge, pt would benefit from ongoing therapy.         If plan is discharge home, recommend the following: Two people to help with walking and/or transfers;Two people to help with bathing/dressing/bathroom;Assistance with cooking/housework;Assist for transportation;[name]   Can travel by private vehicle   No    Equipment Recommendations Other (comment) (pt has needed DME at LTC facility)  Recommendations for Other Services       Functional Status Assessment Patient has had a recent decline in their functional status and/or demonstrates limited ability to make significant improvements in function in a reasonable and  predictable amount of time     Precautions / Restrictions Precautions Precautions: Fall Precaution Comments: NPO; Aspiration; abdominal binder; LUQ colostomy; NG tube Restrictions Weight Bearing Restrictions Per Provider Order: No      Mobility  Bed Mobility Overal bed mobility: Needs Assistance Bed Mobility: Rolling Rolling: Max assist         General bed mobility comments: logrolling to L in bed (pt using B UE's on bed rail to assist); assist for trunk; 2 assist to boost pt up in bed using bed pad    Transfers                   General transfer comment: pt declined d/t SOB/fatigue with activity    Ambulation/Gait               General Gait Details: deferred--pt non-ambulatory  Stairs            Wheelchair Mobility     Tilt Bed    Modified Rankin (Stroke Patients Only)       Balance                                             Pertinent Vitals/Pain Pain Assessment Pain Assessment: Faces Pain Score: 9  Pain Location: abdomen Pain Descriptors / Indicators: Discomfort Pain Intervention(s): Limited activity within patient's tolerance, Monitored during session, Repositioned, Patient requesting pain meds-RN notified    Home Living Family/patient expects to be discharged to:: Skilled nursing facility  Additional Comments: Pt reports residing at [facility] (LTC) for past 6 months.    Prior Function Prior Level of Function : Needs assist             Mobility Comments: Pt reports being able to get OOB and transfer to w/c (holds onto bar for transfer) with assist but uses lift (described as sit to stand lift) to get back to bed; pt reports participating in physical therapy (using bike; working on standing); pt reports he can push manual w/c with UE's on his own in his room but requires assist for longer distances ADLs Comments: Requires assist for bathing/dressing/ADL's     Extremity/Trunk  Assessment   Upper Extremity Assessment Upper Extremity Assessment: RUE deficits/detail;LUE deficits/detail RUE Deficits / Details: strong R hand grip; at least 4/5 R elbow flexion/extension LUE Deficits / Details: h/o hemi-paresis; good L hand grip strength; at least 4-/5 elbow flexion/extension    Lower Extremity Assessment Lower Extremity Assessment: Generalized weakness;LLE deficits/detail;RLE deficits/detail RLE Deficits / Details: unable to perform R LE SLR; at least 3/5 AROM hip flexion, knee flexion/extension, and DF LLE Deficits / Details: able to lift L LE off bed briefly for SLR; at least 3/5 AROM hip flexion and DF/PF    Cervical / Trunk Assessment Cervical / Trunk Assessment: Other exceptions Cervical / Trunk Exceptions: forward head/shoulders  Communication   Communication Communication: Difficulty communicating thoughts/reduced clarity of speech Cueing Techniques: Verbal cues  Cognition Arousal: Alert Behavior During Therapy: Flat affect Overall Cognitive Status: No family/caregiver present to determine baseline cognitive functioning                                 General Comments: Oriented to person, hospital, January 2025 (pt reported it was the 1st), and general situation.        General Comments  Nursing cleared pt for participation in physical therapy.  Pt agreeable to PT session.    Exercises     Assessment/Plan    PT Assessment Patient needs continued PT services  PT Problem List Decreased strength;Decreased activity tolerance;Decreased balance;Decreased mobility;Pain       PT Treatment Interventions DME instruction;Functional mobility training;Therapeutic activities;Therapeutic exercise;Balance training;Patient/family education;Wheelchair mobility training    PT Goals (Current goals can be found in the Care Plan section)  Acute Rehab PT Goals Patient Stated Goal: to return to LTC facility and participate in therapy PT Goal  Formulation: With patient Time For Goal Achievement: 09/20/23 Potential to Achieve Goals: Good    Frequency Min 1X/week     Co-evaluation               AM-PAC PT 6 Clicks Mobility  Outcome Measure Help needed turning from your back to your side while in a flat bed without using bedrails?: A Lot Help needed moving from lying on your back to sitting on the side of a flat bed without using bedrails?: Total Help needed moving to and from a bed to a chair (including a wheelchair)?: Total Help needed standing up from a chair using your arms (e.g., wheelchair or bedside chair)?: Total Help needed to walk in hospital room?: Total Help needed climbing 3-5 steps with a railing? : Total 6 Click Score: 7    End of Session Equipment Utilized During Treatment: Oxygen (2 L via nasal cannula) Activity Tolerance: Patient limited by fatigue;Patient limited by pain Patient left: in bed;with call bell/phone within reach;with bed alarm set;Other (comment) (B heels  floating via pillow support) Nurse Communication: Mobility status;Patient requests pain meds;Precautions PT Visit Diagnosis: Other abnormalities of gait and mobility (R26.89);Muscle weakness (generalized) (M62.81);Pain    Time: 9067-9046 PT Time Calculation (min) (ACUTE ONLY): 21 min   Charges:   PT Evaluation $PT Eval Low Complexity: 1 Low   PT General Charges $$ ACUTE PT VISIT: 1 Visit        Damien Caulk, PT 09/06/23, 10:23 AM

## 2023-09-06 NOTE — Progress Notes (Signed)
 PROGRESS NOTE    Calvin [name]  FMW:[id number] DOB: 1951/05/24 DOA: 09/03/2023 PCP: Laurine Gladden, MD     Brief Narrative:   From admission h and p  Calvin [name] is a 73 y.o. 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   Symptoms started yesterday patient started to develop cramping-like abdominal pain, centrally located, associated with nauseous vomiting of stomach content, multiple times denies any blood or bile in the vomitus, no fever or chills.  Overnight patient started to have productive cough with light-colored phlegm no chest pains.   Assessment & Plan:   Principal Problem:   Acute respiratory failure with hypoxia (HCC) Active Problems:   Seizure (HCC)   Ogilvie syndrome   Paroxysmal atrial flutter (HCC)   Chronic diastolic CHF (congestive heart failure) (HCC)   Essential hypertension   CAD (coronary artery disease)   COPD (chronic obstructive pulmonary disease) (HCC)   History of CVA (cerebrovascular accident)   Sepsis (HCC)  # Flu A - continue tamiflu  to complete 5 day course  # CAP LLL infiltrate vs edema. Aspiration risk, worth covering for that - cefepme/flagyl >unasyn , Abx started 12/30, will treat for 5 days  # Sepsis, severe By fever, tachycardia, elevated lactic acid. Hemodynamically stable. Lactate normalized - continue fluids while npo  # Acute hypoxic respiratory failure 2/2 flu/cap, currently on 2 liters and satting well down from 3 yesterday, no respiratory distress - wean [place] o2 as able  # SBO History ogilvie syndrome s/p colostomy, here with some vomiting, kub showing signs of obstruction. Gen surg following. Several episodes emesis 1/1, ng tube placed.  CT today ordered by gen surg, per them appears to have SBO - NG tube in place - may need to start tpn if SBO persists - surgery remains a possibility  # Anemia Hgb has drifted from baseline of 11-12 to 8s yesterday and today today. discussed with GI Dr. 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 Worsening - has binder - gen surg following  # COPD Some wheeze, ill as above - cont prednisone  while treating for flu  # AKI on ckd 2 Prerenal 2/2 dehydration, resolved w/ fluids - continue IVF while npo  # Lactic acidosis 2/2 above processes, resolved - continue IVF   # Seizure disorder With recent hospitalization for breakthrough seizures - continue keppra , home lamictal  and pregabalin   # hx CVA  With hemiplegia - hold asa given anemia as above  # BPH  - cont home flomax   # HFpEF Dehydrated as above - judicious IV fluids  # Debility Resides at snf - PT consulted, advising SNF  DVT prophylaxis: scds Code Status: full Family Communication: daughter stephanie updated telephonically 1/2  Level of care: Telemetry Medical Status is: Inpatient Remains inpatient appropriate because: severity of illness    Consultants:  Gen surg  Procedures: none  Antimicrobials:  See above    Subjective: confused  Objective: Vitals:   09/06/23 0530 09/06/23 0708 09/06/23 0811 09/06/23 1253  BP: 120/83  110/64 108/60  Pulse: 87  70 68  Resp: 18  16 16   Temp: 98.2 F (36.8 C)  97.6 F (36.4 C) 98.2 F (36.8 C)  TempSrc:      SpO2: 96% 96% 96% 96%  Weight:      Height:        Intake/Output Summary (Last 24 hours) at 09/06/2023 1529 Last data filed at 09/06/2023 9482 Gross per 24 hour  Intake 518.3 ml  Output 2685 ml  Net -2166.7 ml    Filed Weights   09/04/23 1347 09/04/23 1701  Weight: 97.8 kg 93.1 kg    Examination:  General exam: chronically ill appearing Respiratory system: exp wheeze faint, scattered rhonchi, rales at base Cardiovascular system: S1 & S2 heard, tachycardic Gastrointestinal system: Abdomen is distended, binder in place, greenish fluid from ng tube Central nervous system: Alert and oriented. Left sided weakness Extremities: warm, no edema Skin: No visible rashes, lesions or ulcers Psychiatry: confused    Data Reviewed: I have personally reviewed following labs and imaging studies  CBC: Recent Labs  Lab 09/03/23 0308 09/04/23 0455 09/05/23 0427 09/06/23 0622  WBC 16.4* 12.8* 9.1 6.2  NEUTROABS 13.2*  --   --   --   HGB 12.9* 9.6* 8.4* 8.8*  HCT 42.5 31.9* 27.6* 28.9*  MCV 99.1 101.3* 98.6 97.6  PLT 372 210 185 192   Basic Metabolic Panel: Recent Labs  Lab 09/03/23 0308 09/04/23 0455 09/05/23 0427 09/06/23 0622  NA 134* 136 137 137  K 4.7 4.2 4.9 4.0  CL 96* 105 105 103  CO2 22 22 23 25   GLUCOSE 149* 96 109* 108*  BUN 14 19 17 15   CREATININE 2.48* 1.60* 1.25* 1.06  CALCIUM  9.1 7.8* 7.8* 7.7*   GFR: Estimated Creatinine Clearance: 71.3 mL/min (by C-G formula based on SCr of 1.06 mg/dL). Liver Function Tests: Recent Labs  Lab 09/03/23 0308  AST 41  ALT 43  ALKPHOS 87  BILITOT 0.7  PROT 8.6*  ALBUMIN  3.9   No results for input(s): LIPASE, AMYLASE in the last 168 hours. No results for input(s): AMMONIA in the last 168 hours. Coagulation Profile: Recent Labs  Lab 09/03/23 0308  INR 1.1   Cardiac Enzymes: No results for input(s): CKTOTAL, CKMB, CKMBINDEX, TROPONINI in the last 168 hours. BNP (last 3 results) No results for input(s): PROBNP in the last 8760 hours. HbA1C: No results for input(s): HGBA1C in the last 72 hours. CBG: Recent Labs  Lab 09/03/23 1051  GLUCAP 95   Lipid Profile: No results for input(s): CHOL, HDL,  LDLCALC, TRIG, CHOLHDL, LDLDIRECT in the last 72 hours. Thyroid  Function Tests: No results for input(s): TSH, T4TOTAL, FREET4, T3FREE, THYROIDAB in the last 72 hours. Anemia Panel: Recent Labs    09/05/23 0426 09/06/23 0622  VITAMINB12  --  395  FOLATE 7.3  --    Urine analysis:    Component Value Date/Time   COLORURINE AMBER (A) 09/03/2023 0543   APPEARANCEUR CLOUDY (A) 09/03/2023 0543   APPEARANCEUR Clear 08/14/2014 0933   LABSPEC 1.026 09/03/2023 0543   LABSPEC 1.015 08/14/2014 0933   PHURINE 5.0 09/03/2023 0543   GLUCOSEU NEGATIVE 09/03/2023 0543   GLUCOSEU Negative 08/14/2014 0933   HGBUR NEGATIVE 09/03/2023 0543   BILIRUBINUR NEGATIVE 09/03/2023 0543   BILIRUBINUR Negative 08/14/2014 0933   KETONESUR NEGATIVE 09/03/2023 0543   PROTEINUR 30 (A) 09/03/2023 0543   UROBILINOGEN 0.2 06/12/2010 0103   NITRITE NEGATIVE 09/03/2023 0543   LEUKOCYTESUR LARGE (A) 09/03/2023 0543   LEUKOCYTESUR Negative 08/14/2014 0933  Sepsis Labs: @LABRCNTIP (procalcitonin:4,lacticidven:4)  ) Recent Results (from the past 240 hours)  Blood Culture (routine x 2)     Status: None (Preliminary result)   Collection Time: 09/03/23  3:08 AM   Specimen: BLOOD  Result Value Ref Range Status   Specimen Description BLOOD BLOOD RIGHT ARM  Final   Special Requests   Final    BOTTLES DRAWN AEROBIC AND ANAEROBIC Blood Culture results may not be optimal due to an inadequate volume of blood received in culture bottles   Culture   Final    NO GROWTH 3 DAYS Performed at [facility], [address], [place], [place] [address]    Report Status PENDING  Incomplete  Resp panel by RT-PCR (RSV, Flu A&B, Covid) Anterior Nasal Swab     Status: Abnormal   Collection Time: 09/03/23  3:18 AM   Specimen: Anterior Nasal Swab  Result Value Ref Range Status   SARS Coronavirus 2 by RT PCR NEGATIVE NEGATIVE Final    Comment: (NOTE) SARS-CoV-2 target nucleic acids are NOT DETECTED.  The  SARS-CoV-2 RNA is generally detectable in upper respiratory specimens during the acute phase of infection. The lowest concentration of SARS-CoV-2 viral copies this assay can detect is 138 copies/mL. A negative result does not preclude SARS-Cov-2 infection and should not be used as the sole basis for treatment or other patient management decisions. A negative result may occur with  improper specimen collection/handling, submission of specimen other than nasopharyngeal swab, presence of viral mutation(s) within the areas targeted by this assay, and inadequate number of viral copies(<138 copies/mL). A negative result must be combined with clinical observations, patient history, and epidemiological information. The expected result is Negative.  Fact Sheet for Patients:  bloggercourse.com  Fact Sheet for Healthcare Providers:  seriousbroker.it  This test is no t yet approved or cleared by the [place]  FDA and  has been authorized for detection and/or diagnosis of SARS-CoV-2 by FDA under an Emergency Use Authorization (EUA). This EUA will remain  in effect (meaning this test can be used) for the duration of the COVID-19 declaration under Section 564(b)(1) of the Act, 21 U.S.C.section 360bbb-3(b)(1), unless the authorization is terminated  or revoked sooner.       Influenza A by PCR POSITIVE (A) NEGATIVE Final   Influenza B by PCR NEGATIVE NEGATIVE Final    Comment: (NOTE) The Xpert Xpress SARS-CoV-2/FLU/RSV plus assay is intended as an aid in the diagnosis of influenza from Nasopharyngeal swab specimens and should not be used as a sole basis for treatment. Nasal washings and aspirates are unacceptable for Xpert Xpress SARS-CoV-2/FLU/RSV testing.  Fact Sheet for Patients: bloggercourse.com  Fact Sheet for Healthcare Providers: seriousbroker.it  This test is not yet approved or  cleared by the [place]  FDA and has been authorized for detection and/or diagnosis of SARS-CoV-2 by FDA under an Emergency Use Authorization (EUA). This EUA will remain in effect (meaning this test can be used) for the duration of the COVID-19 declaration under Section 564(b)(1) of the Act, 21 U.S.C. section 360bbb-3(b)(1), unless the authorization is terminated or revoked.     Resp Syncytial Virus by PCR NEGATIVE NEGATIVE Final    Comment: (NOTE) Fact Sheet for Patients: bloggercourse.com  Fact Sheet for Healthcare Providers: seriousbroker.it  This test is not yet approved or cleared by the [place]  FDA and has been authorized for detection and/or diagnosis of SARS-CoV-2 by FDA under an Emergency Use Authorization (EUA). This EUA will remain in effect (meaning this test  can be used) for the duration of the COVID-19 declaration under Section 564(b)(1) of the Act, 21 U.S.C. section 360bbb-3(b)(1), unless the authorization is terminated or revoked.  Performed at [facility], [address] [place], [place] [address]   Blood Culture (routine x 2)     Status: None (Preliminary result)   Collection Time: 09/03/23  3:18 AM   Specimen: BLOOD  Result Value Ref Range Status   Specimen Description BLOOD RIGHT ANTECUBITAL  Final   Special Requests   Final    BOTTLES DRAWN AEROBIC AND ANAEROBIC Blood Culture results may not be optimal due to an inadequate volume of blood received in culture bottles   Culture   Final    NO GROWTH 3 DAYS Performed at [facility], [address], [place], [place] [address]    Report Status PENDING  Incomplete  Urine Culture     Status: Abnormal   Collection Time: 09/03/23  5:43 AM   Specimen: Urine, Random  Result Value Ref Range Status   Specimen Description   Final    URINE, RANDOM Performed at [facility], [address], [place], [place] [address]     Special Requests   Final    NONE Reflexed from [id number] Performed at [facility], [address] [place], [place] [address]    Culture 30,000 COLONIES/mL STAPHYLOCOCCUS AUREUS (A)  Final   Report Status 09/05/2023 FINAL  Final   Organism ID, Bacteria STAPHYLOCOCCUS AUREUS (A)  Final      Susceptibility   Staphylococcus aureus - MIC*    CIPROFLOXACIN  >=8 RESISTANT Resistant     GENTAMICIN <=0.5 SENSITIVE Sensitive     NITROFURANTOIN <=16 SENSITIVE Sensitive     OXACILLIN >=4 RESISTANT Resistant     TETRACYCLINE <=1 SENSITIVE Sensitive     VANCOMYCIN  1 SENSITIVE Sensitive     TRIMETH/SULFA >=320 RESISTANT Resistant     RIFAMPIN <=0.5 SENSITIVE Sensitive     Inducible Clindamycin  NEGATIVE Sensitive     LINEZOLID 2 SENSITIVE Sensitive     * 30,000 COLONIES/mL STAPHYLOCOCCUS AUREUS  MRSA Next Gen by PCR, Nasal     Status: Abnormal   Collection Time: 09/03/23 11:00 AM   Specimen: Nasal Mucosa; Nasal Swab  Result Value Ref Range Status   MRSA by PCR Next Gen DETECTED (A) NOT DETECTED Final    Comment: RESULT CALLED TO, READ BACK BY AND VERIFIED WITH: JACOB MOORE @1703  09/03/23 MJU (NOTE) The GeneXpert MRSA Assay (FDA approved for NASAL specimens only), is one component of a comprehensive MRSA colonization surveillance program. It is not intended to diagnose MRSA infection nor to guide or monitor treatment for MRSA infections. Test performance is not FDA approved in patients less than 26 years old. Performed at [facility], [address], [place], [place] [address]          Radiology Studies: DG Abd 1 View Result Date: 09/05/2023 CLINICAL DATA:  Check gastric catheter placement EXAM: ABDOMEN - 1 VIEW COMPARISON:  09/03/2023 FINDINGS: Gastric catheter is noted within the stomach. Scattered small bowel dilatation is noted slightly increased when compared with the prior exam. No free air is noted. IMPRESSION: Gastric catheter within the stomach. Increased small  bowel dilatation is noted when compared with the prior exam. Electronically Signed   By: Oneil Devonshire M.D.   On: 09/05/2023 17:45        Scheduled Meds:  baclofen   5 mg Oral BID   budesonide  (PULMICORT ) nebulizer solution  0.25  mg Nebulization BID   guaiFENesin   600 mg Oral Q12H   ipratropium-albuterol   3 mL Inhalation BID   lactulose   20 g Oral Daily   lamoTRIgine   50 mg Oral BID   melatonin  10 mg Oral QHS   mometasone -formoterol   2 puff Inhalation BID   oseltamivir   30 mg Oral BID   pantoprazole  (PROTONIX ) IV  40 mg Intravenous Q12H   polyethylene glycol  17 g Oral BID   predniSONE   40 mg Oral Q breakfast   pregabalin   200 mg Oral QHS   tamsulosin   0.4 mg Oral QPM   Continuous Infusions:  ampicillin -sulbactam (UNASYN ) IV 3 g (09/06/23 1227)   dextrose  5 % and 0.45 % NaCl 100 mL/hr at 09/06/23 1342   levETIRAcetam  1,000 mg (09/06/23 0914)     LOS: 3 days     Devaughn KATHEE Ban, MD [facility]   If 7PM-7AM, please contact night-coverage [email or web address] Password [email or web address] 09/06/2023, 3:29 PM

## 2023-09-06 NOTE — Progress Notes (Signed)
 Subjective:  CC: Calvin [name] is a 73 y.o. male  Hospital stay day 3,   dilated bowel and ostomy prolapse  HPI: Received call from nurse stating ostomy prolapse has enlarged and patient is in discomfort.  Upon arrival at bedside, patient denies any specific worsening abdominal pain.  ROS:  General: Denies weight loss, weight gain, fatigue, fevers, chills, and night sweats. Heart: Denies chest pain, palpitations, racing heart, irregular heartbeat, leg pain or swelling, and decreased activity tolerance. Respiratory: Denies breathing difficulty, shortness of breath, wheezing, cough, and sputum. GI: Denies change in appetite, heartburn, nausea, vomiting, constipation, diarrhea, and blood in stool. GU: Denies difficulty urinating, pain with urinating, urgency, frequency, blood in urine.   Objective:   Temp:  [97.6 F (36.4 C)-98.9 F (37.2 C)] 98.2 F (36.8 C) (01/02 1253) Pulse Rate:  [68-105] 68 (01/02 1253) Resp:  [16-20] 16 (01/02 1253) BP: (108-147)/(60-100) 108/60 (01/02 1253) SpO2:  [96 %-99 %] 96 % (01/02 1253) FiO2 (%):  [32 %] 32 % (01/01 1929)     Height: 5' 11 (180.3 cm) Weight: 93.1 kg BMI (Calculated): 28.64   Intake/Output this shift:   Intake/Output Summary (Last 24 hours) at 09/06/2023 1327 Last data filed at 09/06/2023 9482 Gross per 24 hour  Intake 518.3 ml  Output 3485 ml  Net -2966.7 ml    Constitutional :  alert, cooperative, appears stated age, and no distress  Respiratory:  clear to auscultation bilaterally  Cardiovascular:  regular rate and rhythm  Gastrointestinal: Soft, no guarding, distention at baseline.  Large ostomy prolapse that is reducible with gentle pressure again noted but seems to have slightly more swelling than yesterday.   .   Skin: Cool and moist.   Psychiatric: Normal affect, non-agitated, not confused       LABS:     Latest Ref Rng & Units 09/06/2023    6:22 AM 09/05/2023    4:27 AM 09/04/2023    4:55 AM  CMP  Glucose 70 - 99 mg/dL  891  890  96   BUN 8 - 23 mg/dL 15  17  19    Creatinine 0.61 - 1.24 mg/dL 8.93  8.74  8.39   Sodium 135 - 145 mmol/L 137  137  136   Potassium 3.5 - 5.1 mmol/L 4.0  4.9  4.2   Chloride 98 - 111 mmol/L 103  105  105   CO2 22 - 32 mmol/L 25  23  22    Calcium  8.9 - 10.3 mg/dL 7.7  7.8  7.8       Latest Ref Rng & Units 09/06/2023    6:22 AM 09/05/2023    4:27 AM 09/04/2023    4:55 AM  CBC  WBC 4.0 - 10.5 K/uL 6.2  9.1  12.8   Hemoglobin 13.0 - 17.0 g/dL 8.8  8.4  9.6   Hematocrit 39.0 - 52.0 % 28.9  27.6  31.9   Platelets 150 - 400 K/uL 192  185  210     RADS: N/a Assessment:   dilated bowel and ostomy prolapse.  Prolapse continues to remain reducible despite slight increase in size.  Previous ostomy bag reportedly was pushed off by the prolapse.  New ostomy bag cut to size and placed over it, and then abdominal binder placed over the area to minimize the degree of prolapse.  Will discuss with a.m. team about possibility of proceeding with revision due to the persistent issue.    NG tube also noted to have over  600 mL of retained fluid after flushing.  Continue n.p.o. status and monitor for now  labs/images/medications/previous chart entries reviewed personally and relevant changes/updates noted above.

## 2023-09-06 NOTE — Progress Notes (Signed)
       CROSS COVER NOTE  NAME: KARVER FADDEN MRN: [id number] DOB : [birth year]    Concern as stated by nurse / staff   Paged to bedside for patient with prolapsed stoma its just getting bigger everytime he coughs     Pertinent findings on chart review:   Assessment and  Interventions   Assessment:    09/05/2023    8:53 PM 09/05/2023    5:26 PM 09/05/2023    1:13 PM  Vitals with BMI  Systolic 121 147 857  Diastolic 100 88 82  Pulse 101 894 103  Alert and oriented  Moderate distress with abd distension, pain Visible prolapsed stoma  worseing with cough effort: stoma is pink/red, no evidence of ischemia Plan: Surgeon paged by nursing - came to bedside and reduced with some effort and abdominal decompression with NGT manipulation Saline gauze to stoma Tessalon  oerkles 200 mg po TID for cough suppression Abdominal binder for counter pressure with movement or coughing       Erminio LITTIE Cone NP [facility] Cross Cover 7pm-7am - check amion for availability Pager [phone]

## 2023-09-06 NOTE — Consult Note (Addendum)
 WOC Nurse Consult Note: Reason for Consult: sacral wound and heel wounds  Patient from SNF, known to WOC nursing team due to presence of ostomy. Addressed ostomy needs this week previously  Wound type: Unstageable heel pressure injury: right lateral heel; 100% black, stable Unstageable heel pressure injury: left heel; lifting skin/eschar, stable  WOC not able to turn patient to assess sacrum; will follow up with bedside nursing. Requested images from staff this am  Pressure Injury POA: Yes Measurement: Left heel 1.0cm x 2.0cm x 0cm  Right heel 2.5cm x 3.0cm x 0cm  Wound bed:100% black eschar, stable, lifting left heel with pink intact skin under eschar/skin  Drainage (amount, consistency, odor) none Periwound: intact  Dressing procedure/placement/frequency: Paint bilateral heel PI with betadine daily, allow to air dry.  Offload with Prevalon boots, requested licensed conveyancer to order.   Notified bedside nursing of same.   Re consult if needed, will not follow at this time. Thanks  Asah Lamay [employer], RN,CWOCN, CNS, CWON-AP [phone])   1344; addendum discussed sacral wound consult. Bedside nursing feels more like crust and foam to manage is appropriate per nursing skin care order set. Niema Carrara [facility], CNS, [employer] [phone]

## 2023-09-06 NOTE — Progress Notes (Signed)
 [facility] SURGICAL PROGRESS NOTE (cpt [phone])  Hospital Day(s): 3.   Interval History: Patient seen and examined. Overnight, had significant prolapse of his stoma. He was seen by Dr Tye and this was reduced. Abdominal binder placed. This morning, he reports he feels better and pain/discomfort improved. No fever, chills, nausea. He continues without leukocytosis; WBC 6.2K. Hgb to 8.8; stable in last 24 hours. Renal function normalized; sCr - 1.06/BUN 15, eGFR >60; UO - 1300 ccs. No electrolyte derangements. NGT in place; output 2000 ccs. He is having ostomy output.    Review of Systems:  Constitutional: denies fever, chills  HEENT: denies cough or congestion  Respiratory: denies any shortness of breath  Cardiovascular: denies chest pain or palpitations  Gastrointestinal: denies abdominal pain, N/V Genitourinary: denies burning with urination or urinary frequency Musculoskeletal: denies pain, decreased motor or sensation  Vital signs in last 24 hours: [min-max] current  Temp:  [98 F (36.7 C)-98.9 F (37.2 C)] 98.2 F (36.8 C) (01/02 0530) Pulse Rate:  [87-110] 87 (01/02 0530) Resp:  [18-20] 18 (01/02 0530) BP: (120-147)/(77-100) 120/83 (01/02 0530) SpO2:  [96 %-99 %] 96 % (01/02 0708) FiO2 (%):  [32 %] 32 % (01/01 1929)     Height: 5' 11 (180.3 cm) Weight: 93.1 kg BMI (Calculated): 28.64   Intake/Output last 2 shifts:  01/01 0701 - 01/02 0700 In: 518.3 [I.V.:218.3; IV Piggyback:300] Out: 3485 [Urine:1300; Emesis/NG output:2000; Stool:185]   Physical Exam:  Constitutional: alert, cooperative and no distress  HENT: normocephalic without obvious abnormality; NGT in place Eyes: PERRL, EOM's grossly intact and symmetric  Respiratory: breathing non-labored at rest  Cardiovascular: regular rate and sinus rhythm  Gastrointestinal: soft, non-tender, and non-distended. Colostomy remains reduced; liquid stool in bag. Abdominal binder in place Genitourinary: Foley in  place; good UO Musculoskeletal: no edema or wounds, motor and sensation grossly intact, NT    Labs:     Latest Ref Rng & Units 09/06/2023    6:22 AM 09/05/2023    4:27 AM 09/04/2023    4:55 AM  CBC  WBC 4.0 - 10.5 K/uL 6.2  9.1  12.8   Hemoglobin 13.0 - 17.0 g/dL 8.8  8.4  9.6   Hematocrit 39.0 - 52.0 % 28.9  27.6  31.9   Platelets 150 - 400 K/uL 192  185  210       Latest Ref Rng & Units 09/05/2023    4:27 AM 09/04/2023    4:55 AM 09/03/2023    3:08 AM  CMP  Glucose 70 - 99 mg/dL 890  96  850   BUN 8 - 23 mg/dL 17  19  14    Creatinine 0.61 - 1.24 mg/dL 8.74  8.39  7.51   Sodium 135 - 145 mmol/L 137  136  134   Potassium 3.5 - 5.1 mmol/L 4.9  4.2  4.7   Chloride 98 - 111 mmol/L 105  105  96   CO2 22 - 32 mmol/L 23  22  22    Calcium  8.9 - 10.3 mg/dL 7.8  7.8  9.1   Total Protein 6.5 - 8.1 g/dL   8.6   Total Bilirubin <1.2 mg/dL   0.7   Alkaline Phos 38 - 126 U/L   87   AST 15 - 41 U/L   41   ALT 0 - 44 U/L   43     Imaging studies: No new pertinent imaging studies   Assessment/Plan: (ICD-10's: K7.609) 73 y.o. male with recurrent ileus  vs partial SBO, also now likely with parastomal hernia, admitted with suspected HCAP, complicated by pertinent comorbidities including deconditioning, malnutrition, Hx of CVA   - WE will plan for repeat CT Abdomen/Pelvis today to reassess intra-abdominal process(es) and anatomy. Unfortunately, given his ostomy herniation/prolapse and concomitant obstructive presentation, he may need surgical revision of this ostomy. He does endorse that he may not want surgery. We will follow up after completion of CT.   - For now, continue NPO. Okay for a few ice chips for comfort - May need TPN pending progression/need for surgery   - Continue NGT decompression; LIS; monitor and record output   - Monitor abdominal examination - Monitor colostomy output; record - Continue abdominal binder - Pain control prn; antiemetics prn - Further management per primary  service; we will follow    All of the above findings and recommendations were discussed with the patient, and the medical team, and all of patient's questions were answered to his expressed satisfaction.  -- Arthea Platt, PA-C [facility] 09/06/2023, 7:21 AM M-F: 7am - 4pm

## 2023-09-06 NOTE — Progress Notes (Signed)
 SLP Cancellation Note  Patient Details Name: Calvin [name] MRN: [id number] DOB: August 18, 1951   Cancelled treatment:       Reason Eval/Treat Not Completed: Medical issues which prohibited therapy. Per surgical team progress note, pt remains NPO. Therefore, dysphagia intervention prohibited at this time. SLP will  continue to monitor for pt readiness per guidance of medical team.   Calvin Luviano Clapp MS [facility] SLP    Calvin [name] 09/06/2023, 10:38 AM

## 2023-09-07 ENCOUNTER — Other Ambulatory Visit: Payer: Self-pay

## 2023-09-07 ENCOUNTER — Inpatient Hospital Stay: Payer: Medicare Other

## 2023-09-07 DIAGNOSIS — R000 Tachycardia, unspecified: Secondary | ICD-10-CM | POA: Diagnosis not present

## 2023-09-07 DIAGNOSIS — K56609 Unspecified intestinal obstruction, unspecified as to partial versus complete obstruction: Secondary | ICD-10-CM | POA: Diagnosis not present

## 2023-09-07 DIAGNOSIS — J9601 Acute respiratory failure with hypoxia: Secondary | ICD-10-CM

## 2023-09-07 LAB — BASIC METABOLIC PANEL WITH GFR
Anion gap: 9 (ref 5–15)
BUN: 10 mg/dL (ref 8–23)
CO2: 25 mmol/L (ref 22–32)
Calcium: 7.9 mg/dL — ABNORMAL LOW (ref 8.9–10.3)
Chloride: 106 mmol/L (ref 98–111)
Creatinine, Ser: 1.03 mg/dL (ref 0.61–1.24)
GFR, Estimated: >60 mL/min
Glucose, Bld: 88 mg/dL (ref 70–99)
Potassium: 3.5 mmol/L (ref 3.5–5.1)
Sodium: 140 mmol/L (ref 135–145)

## 2023-09-07 LAB — HEPATIC FUNCTION PANEL
ALT: 17 U/L (ref 0–44)
AST: 15 U/L (ref 15–41)
Albumin: 2.4 g/dL — ABNORMAL LOW (ref 3.5–5.0)
Alkaline Phosphatase: 39 U/L (ref 38–126)
Bilirubin, Direct: <0.1 mg/dL (ref 0.0–0.2)
Total Bilirubin: 0.5 mg/dL (ref 0.0–1.2)
Total Protein: 5.8 g/dL — ABNORMAL LOW (ref 6.5–8.1)

## 2023-09-07 LAB — LACTIC ACID, PLASMA: Lactic Acid, Venous: 0.7 mmol/L (ref 0.5–1.9)

## 2023-09-07 LAB — CBC
HCT: 28.3 % — ABNORMAL LOW (ref 39.0–52.0)
Hemoglobin: 8.7 g/dL — ABNORMAL LOW (ref 13.0–17.0)
MCH: 29.1 pg (ref 26.0–34.0)
MCHC: 30.7 g/dL (ref 30.0–36.0)
MCV: 94.6 fL (ref 80.0–100.0)
Platelets: 168 K/uL (ref 150–400)
RBC: 2.99 MIL/uL — ABNORMAL LOW (ref 4.22–5.81)
RDW: 13.9 % (ref 11.5–15.5)
WBC: 4 K/uL (ref 4.0–10.5)
nRBC: 0 % (ref 0.0–0.2)

## 2023-09-07 LAB — MAGNESIUM: Magnesium: 1.7 mg/dL (ref 1.7–2.4)

## 2023-09-07 LAB — PHOSPHORUS: Phosphorus: 2.5 mg/dL (ref 2.5–4.6)

## 2023-09-07 LAB — GLUCOSE, CAPILLARY
Glucose-Capillary: 113 mg/dL — ABNORMAL HIGH (ref 70–99)
Glucose-Capillary: 135 mg/dL — ABNORMAL HIGH (ref 70–99)
Glucose-Capillary: 142 mg/dL — ABNORMAL HIGH (ref 70–99)

## 2023-09-07 LAB — TRIGLYCERIDES: Triglycerides: 108 mg/dL (ref <150)

## 2023-09-07 MED ORDER — TRAVASOL 10 % IV SOLN
INTRAVENOUS | Status: AC
  Filled 2023-09-07: qty 528

## 2023-09-07 MED ORDER — SODIUM CHLORIDE 0.9% FLUSH
10.0000 mL | Freq: Two times a day (BID) | INTRAVENOUS | Status: DC
  Administered 2023-09-07 – 2023-09-12 (×8): 10 mL
  Administered 2023-09-12: 20 mL
  Administered 2023-09-13: 10 mL
  Administered 2023-09-13: 30 mL
  Administered 2023-09-14: 10 mL
  Administered 2023-09-14: 30 mL
  Administered 2023-09-15 – 2023-09-17 (×5): 10 mL
  Administered 2023-09-17: 20 mL
  Administered 2023-09-18: 40 mL
  Administered 2023-09-18 – 2023-09-19 (×2): 10 mL
  Administered 2023-09-19: 40 mL
  Administered 2023-09-20 – 2023-09-23 (×7): 10 mL
  Administered 2023-09-24: 20 mL
  Administered 2023-09-24: 10 mL
  Administered 2023-09-25: 20 mL
  Administered 2023-09-26 – 2023-09-28 (×5): 10 mL
  Administered 2023-09-28: 30 mL
  Administered 2023-09-29 – 2023-10-01 (×5): 10 mL
  Administered 2023-10-01: 20 mL
  Administered 2023-10-02: 10 mL
  Administered 2023-10-02: 30 mL
  Administered 2023-10-03 – 2023-10-06 (×7): 10 mL

## 2023-09-07 MED ORDER — FAT EMUL FISH OIL/PLANT BASED 20% (SMOFLIPID)IV EMUL: 250.0000 mL | INTRAVENOUS | Status: DC

## 2023-09-07 MED ORDER — MAGNESIUM SULFATE 2 GM/50ML IV SOLN
2.0000 g | Freq: Once | INTRAVENOUS | Status: AC
Start: 2023-09-07 — End: 2023-09-07
  Administered 2023-09-07: 2 g via INTRAVENOUS
  Filled 2023-09-07: qty 50

## 2023-09-07 MED ORDER — THIAMINE HCL 100 MG/ML IJ SOLN
100.0000 mg | INTRAMUSCULAR | Status: AC
  Administered 2023-09-08 – 2023-09-14 (×6): 100 mg via INTRAVENOUS
  Filled 2023-09-07 (×6): qty 2

## 2023-09-07 MED ORDER — FAT EMUL FISH OIL/PLANT BASED 20% (SMOFLIPID)IV EMUL
250.0000 mL | INTRAVENOUS | Status: DC
  Filled 2023-09-07: qty 250

## 2023-09-07 MED ORDER — CHLORHEXIDINE GLUCONATE CLOTH 2 % EX PADS
6.0000 | MEDICATED_PAD | Freq: Every day | CUTANEOUS | Status: AC
  Administered 2023-09-07 – 2023-09-11 (×5): 6 via TOPICAL

## 2023-09-07 MED ORDER — INSULIN ASPART 100 UNIT/ML IJ SOLN
0.0000 [IU] | INTRAMUSCULAR | Status: DC
  Administered 2023-09-07 – 2023-09-08 (×5): 1 [IU] via SUBCUTANEOUS
  Administered 2023-09-08: 2 [IU] via SUBCUTANEOUS
  Administered 2023-09-09 (×3): 1 [IU] via SUBCUTANEOUS
  Administered 2023-09-09 – 2023-09-11 (×2): 2 [IU] via SUBCUTANEOUS
  Administered 2023-09-12 (×2): 1 [IU] via SUBCUTANEOUS
  Administered 2023-09-12: 2 [IU] via SUBCUTANEOUS
  Administered 2023-09-12 – 2023-09-13 (×3): 1 [IU] via SUBCUTANEOUS
  Filled 2023-09-07 (×15): qty 1

## 2023-09-07 MED ORDER — SODIUM CHLORIDE 0.9% FLUSH: 10.0000 mL | INTRAVENOUS | Status: DC | PRN

## 2023-09-07 MED ORDER — THIAMINE HCL 100 MG/ML IJ SOLN
100.0000 mg | Freq: Once | INTRAMUSCULAR | Status: AC
  Administered 2023-09-07: 100 mg via INTRAVENOUS
  Filled 2023-09-07: qty 2

## 2023-09-07 MED ORDER — TRACE MINERALS CU-MN-SE-ZN 300-55-60-3000 MCG/ML IV SOLN: INTRAVENOUS | Status: DC

## 2023-09-07 MED ORDER — DEXTROSE-SODIUM CHLORIDE 5-0.45 % IV SOLN: INTRAVENOUS | Status: AC

## 2023-09-07 MED ORDER — MUPIROCIN 2 % EX OINT
1.0000 | TOPICAL_OINTMENT | Freq: Two times a day (BID) | CUTANEOUS | Status: AC
  Administered 2023-09-07 – 2023-09-12 (×7): 1 via NASAL
  Filled 2023-09-07: qty 22

## 2023-09-07 NOTE — Progress Notes (Addendum)
 PROGRESS NOTE    Calvin [name]  FMW:[id number] DOB: 1950-12-13 DOA: 09/03/2023 PCP: Laurine Gladden, MD     Brief Narrative:   From admission h and p  Calvin [name] is a 73 y.o. 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   Symptoms started yesterday patient started to develop cramping-like abdominal pain, centrally located, associated with nauseous vomiting of stomach content, multiple times denies any blood or bile in the vomitus, no fever or chills.  Overnight patient started to have productive cough with light-colored phlegm no chest pains.   Assessment & Plan:   Principal Problem:   Acute respiratory failure with hypoxia (HCC) Active Problems:   Seizure (HCC)   Ogilvie syndrome   Paroxysmal atrial flutter (HCC)   Chronic diastolic CHF (congestive heart failure) (HCC)   Essential hypertension   CAD (coronary artery disease)   COPD (chronic obstructive pulmonary disease) (HCC)   History of CVA (cerebrovascular accident)   Sepsis (HCC)  # Flu A - continue tamiflu  to complete 5 day course  # CAP LLL infiltrate vs edema. Aspiration risk, worth covering for that - cefepme/flagyl >unasyn , Abx started 12/30, will treat for 5 days  # Sepsis, severe By fever, tachycardia, elevated lactic acid. Hemodynamically stable. Lactate normalized - continue fluids while npo  # Acute hypoxic respiratory failure 2/2 flu/cap, currently on 2 liters and stable, most recent sat 99% - wean  o2 as able  # SBO History ogilvie syndrome s/p colostomy, here with some vomiting, kub showing signs of obstruction. Gen surg following. Several episodes emesis 1/1, ng tube placed. CT today ordered by gen surg,  per them appears to have SBO - NG tube in place - order for picc and tpn - surgery remains a possibility  # Anemia Hgb has drifted from baseline of 11-12 to 8s and now stable there. discussed with GI Dr. 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 Worsening - has binder - gen surg following  # COPD Some wheeze, ill as above - cont prednisone  while treating for flu  # AKI on ckd 2 Prerenal 2/2 dehydration, resolved w/ fluids - continue IVF while npo  # Lactic acidosis 2/2 above processes, resolved - continue IVF   # Seizure disorder With recent hospitalization for breakthrough seizures - continue keppra , home lamictal  and pregabalin   # hx CVA  With hemiplegia - hold asa given anemia as above  # BPH  - cont home flomax   # HFpEF Dehydrated as above - judicious IV fluids  # Debility Resides at snf - PT consulted, advising SNF  DVT prophylaxis: scds Code Status: full Family Communication: daughter Calvin [name] updated telephonically 1/2. Calvin [name] updated telephonically 1/3  Level of care: Telemetry Medical Status is: Inpatient Remains inpatient appropriate because: severity of illness    Consultants:  Gen surg  Procedures: none  Antimicrobials:  See above    Subjective: Reports ongoing abdominal pain  Objective: Vitals:   09/06/23 2328 09/07/23 0322 09/07/23 0732 09/07/23 0742  BP: 100/64 135/85  129/83  Pulse: 83  98  (!) 105  Resp: 18 20  17   Temp: 97.8 F (36.6 C) 97.7 F (36.5 C)  98.3 F (36.8 C)  TempSrc: Oral Oral    SpO2: 94% 91% 95% 99%  Weight:      Height:        Intake/Output Summary (Last 24 hours) at 09/07/2023 1050 Last data filed at 09/07/2023 9351 Gross per 24 hour  Intake --  Output 2700 ml   Net -2700 ml   Filed Weights   09/04/23 1347 09/04/23 1701  Weight: 97.8 kg 93.1 kg    Examination:  General exam: chronically ill appearing Respiratory system: exp wheeze faint, scattered rhonchi, rales at base Cardiovascular system: S1 & S2 heard, tachycardic Gastrointestinal system: Abdomen is distended, binder in place, greenish fluid from ng tube, diffuse mild tenderness Central nervous system: Alert and oriented. Left sided weakness Extremities: warm, no edema Skin: No visible rashes, lesions or ulcers Psychiatry: calm    Data Reviewed: I have personally reviewed following labs and imaging studies  CBC: Recent Labs  Lab 09/03/23 0308 09/04/23 0455 09/05/23 0427 09/06/23 0622 09/07/23 0351  WBC 16.4* 12.8* 9.1 6.2 4.0  NEUTROABS 13.2*  --   --   --   --   HGB 12.9* 9.6* 8.4* 8.8* 8.7*  HCT 42.5 31.9* 27.6* 28.9* 28.3*  MCV 99.1 101.3* 98.6 97.6 94.6  PLT 372 210 185 192 168   Basic Metabolic Panel: Recent Labs  Lab 09/03/23 0308 09/04/23 0455 09/05/23 0427 09/06/23 0622 09/07/23 0351  NA 134* 136 137 137 140  K 4.7 4.2 4.9 4.0 3.5  CL 96* 105 105 103 106  CO2 22 22 23 25 25   GLUCOSE 149* 96 109* 108* 88  BUN 14 19 17 15 10   CREATININE 2.48* 1.60* 1.25* 1.06 1.03  CALCIUM  9.1 7.8* 7.8* 7.7* 7.9*  MG  --   --   --   --  1.7  PHOS  --   --   --   --  2.5   GFR: Estimated Creatinine Clearance: 73.3 mL/min (by C-G formula based on SCr of 1.03 mg/dL). Liver Function Tests: Recent Labs  Lab 09/03/23 0308 09/07/23 0351  AST 41 15  ALT 43 17  ALKPHOS 87 39  BILITOT 0.7 0.5  PROT 8.6* 5.8*  ALBUMIN  3.9 2.4*   No results for input(s): LIPASE, AMYLASE in the last 168 hours. No results for input(s): AMMONIA in the last 168 hours. Coagulation Profile: Recent Labs  Lab 09/03/23 0308  INR 1.1   Cardiac Enzymes: No results for input(s): CKTOTAL, CKMB, CKMBINDEX, TROPONINI in the last 168 hours. BNP (last 3 results) No results for  input(s): PROBNP in the last 8760 hours. HbA1C: No results for input(s): HGBA1C in the last 72 hours. CBG: Recent Labs  Lab 09/03/23 1051  GLUCAP 95   Lipid Profile: Recent Labs    09/07/23 0351  TRIG 108   Thyroid  Function Tests: No results for input(s): TSH, T4TOTAL, FREET4, T3FREE, THYROIDAB in the last 72 hours. Anemia Panel: Recent Labs    09/05/23 0426 09/06/23 0622  VITAMINB12  --  395  FOLATE 7.3  --    Urine analysis:    Component Value Date/Time   COLORURINE AMBER (A) 09/03/2023 0543   APPEARANCEUR CLOUDY (A) 09/03/2023 0543   APPEARANCEUR Clear 08/14/2014 0933   LABSPEC 1.026 09/03/2023 0543   LABSPEC 1.015 08/14/2014 0933   PHURINE 5.0 09/03/2023 0543   GLUCOSEU NEGATIVE 09/03/2023 0543   GLUCOSEU  Negative 08/14/2014 0933   HGBUR NEGATIVE 09/03/2023 0543   BILIRUBINUR NEGATIVE 09/03/2023 0543   BILIRUBINUR Negative 08/14/2014 0933   KETONESUR NEGATIVE 09/03/2023 0543   PROTEINUR 30 (A) 09/03/2023 0543   UROBILINOGEN 0.2 06/12/2010 0103   NITRITE NEGATIVE 09/03/2023 0543   LEUKOCYTESUR LARGE (A) 09/03/2023 0543   LEUKOCYTESUR Negative 08/14/2014 0933   Sepsis Labs: @LABRCNTIP (procalcitonin:4,lacticidven:4)  ) Recent Results (from the past 240 hours)  Blood Culture (routine x 2)     Status: None (Preliminary result)   Collection Time: 09/03/23  3:08 AM   Specimen: BLOOD  Result Value Ref Range Status   Specimen Description BLOOD BLOOD RIGHT ARM  Final   Special Requests   Final    BOTTLES DRAWN AEROBIC AND ANAEROBIC Blood Culture results may not be optimal due to an inadequate volume of blood received in culture bottles   Culture   Final    NO GROWTH 4 DAYS Performed at [facility], [address], [place], [place] [address]    Report Status PENDING  Incomplete  Resp panel by RT-PCR (RSV, Flu A&B, Covid) Anterior Nasal Swab     Status: Abnormal   Collection Time: 09/03/23  3:18 AM   Specimen: Anterior Nasal Swab   Result Value Ref Range Status   SARS Coronavirus 2 by RT PCR NEGATIVE NEGATIVE Final    Comment: (NOTE) SARS-CoV-2 target nucleic acids are NOT DETECTED.  The SARS-CoV-2 RNA is generally detectable in upper respiratory specimens during the acute phase of infection. The lowest concentration of SARS-CoV-2 viral copies this assay can detect is 138 copies/mL. A negative result does not preclude SARS-Cov-2 infection and should not be used as the sole basis for treatment or other patient management decisions. A negative result may occur with  improper specimen collection/handling, submission of specimen other than nasopharyngeal swab, presence of viral mutation(s) within the areas targeted by this assay, and inadequate number of viral copies(<138 copies/mL). A negative result must be combined with clinical observations, patient history, and epidemiological information. The expected result is Negative.  Fact Sheet for Patients:  bloggercourse.com  Fact Sheet for Healthcare Providers:  seriousbroker.it  This test is no t yet approved or cleared by the [place]  FDA and  has been authorized for detection and/or diagnosis of SARS-CoV-2 by FDA under an Emergency Use Authorization (EUA). This EUA will remain  in effect (meaning this test can be used) for the duration of the COVID-19 declaration under Section 564(b)(1) of the Act, 21 U.S.C.section 360bbb-3(b)(1), unless the authorization is terminated  or revoked sooner.       Influenza A by PCR POSITIVE (A) NEGATIVE Final   Influenza B by PCR NEGATIVE NEGATIVE Final    Comment: (NOTE) The Xpert Xpress SARS-CoV-2/FLU/RSV plus assay is intended as an aid in the diagnosis of influenza from Nasopharyngeal swab specimens and should not be used as a sole basis for treatment. Nasal washings and aspirates are unacceptable for Xpert Xpress SARS-CoV-2/FLU/RSV testing.  Fact Sheet for  Patients: bloggercourse.com  Fact Sheet for Healthcare Providers: seriousbroker.it  This test is not yet approved or cleared by the [place]  FDA and has been authorized for detection and/or diagnosis of SARS-CoV-2 by FDA under an Emergency Use Authorization (EUA). This EUA will remain in effect (meaning this test can be used) for the duration of the COVID-19 declaration under Section 564(b)(1) of the Act, 21 U.S.C. section 360bbb-3(b)(1), unless the authorization is terminated or revoked.     Resp Syncytial Virus by PCR  NEGATIVE NEGATIVE Final    Comment: (NOTE) Fact Sheet for Patients: bloggercourse.com  Fact Sheet for Healthcare Providers: seriousbroker.it  This test is not yet approved or cleared by the [place]  FDA and has been authorized for detection and/or diagnosis of SARS-CoV-2 by FDA under an Emergency Use Authorization (EUA). This EUA will remain in effect (meaning this test can be used) for the duration of the COVID-19 declaration under Section 564(b)(1) of the Act, 21 U.S.C. section 360bbb-3(b)(1), unless the authorization is terminated or revoked.  Performed at [facility], [address] [place], [place] [address]   Blood Culture (routine x 2)     Status: None (Preliminary result)   Collection Time: 09/03/23  3:18 AM   Specimen: BLOOD  Result Value Ref Range Status   Specimen Description BLOOD RIGHT ANTECUBITAL  Final   Special Requests   Final    BOTTLES DRAWN AEROBIC AND ANAEROBIC Blood Culture results may not be optimal due to an inadequate volume of blood received in culture bottles   Culture   Final    NO GROWTH 4 DAYS Performed at [facility], [address], [place], [place] [address]    Report Status PENDING  Incomplete  Urine Culture     Status: Abnormal   Collection Time: 09/03/23  5:43 AM   Specimen: Urine, Random   Result Value Ref Range Status   Specimen Description   Final    URINE, RANDOM Performed at [facility], [address] [place], [place] [address]    Special Requests   Final    NONE Reflexed from [id number] Performed at [facility], [address] [place], [place] [address]    Culture 30,000 COLONIES/mL STAPHYLOCOCCUS AUREUS (A)  Final   Report Status 09/05/2023 FINAL  Final   Organism ID, Bacteria STAPHYLOCOCCUS AUREUS (A)  Final      Susceptibility   Staphylococcus aureus - MIC*    CIPROFLOXACIN  >=8 RESISTANT Resistant     GENTAMICIN <=0.5 SENSITIVE Sensitive     NITROFURANTOIN <=16 SENSITIVE Sensitive     OXACILLIN >=4 RESISTANT Resistant     TETRACYCLINE <=1 SENSITIVE Sensitive     VANCOMYCIN  1 SENSITIVE Sensitive     TRIMETH/SULFA >=320 RESISTANT Resistant     RIFAMPIN <=0.5 SENSITIVE Sensitive     Inducible Clindamycin  NEGATIVE Sensitive     LINEZOLID 2 SENSITIVE Sensitive     * 30,000 COLONIES/mL STAPHYLOCOCCUS AUREUS  MRSA Next Gen by PCR, Nasal     Status: Abnormal   Collection Time: 09/03/23 11:00 AM   Specimen: Nasal Mucosa; Nasal Swab  Result Value Ref Range Status   MRSA by PCR Next Gen DETECTED (A) NOT DETECTED Final    Comment: RESULT CALLED TO, READ BACK BY AND VERIFIED WITH: JACOB MOORE @1703  09/03/23 MJU (NOTE) The GeneXpert MRSA Assay (FDA approved for NASAL specimens only), is one component of a comprehensive MRSA colonization surveillance program. It is not intended to diagnose MRSA infection nor to guide or monitor treatment for MRSA infections. Test performance is not FDA approved in patients less than 67 years old. Performed at [facility], [address], [place], [place] [address]          Radiology Studies: US  EKG SITE RITE Result Date: 09/07/2023 If Site Rite image not attached, placement could not be confirmed due to current cardiac rhythm.  US  EKG SITE RITE Result Date: 09/07/2023 If Site Rite image not  attached, placement could not be confirmed due to  current cardiac rhythm.  CT ABDOMEN PELVIS W CONTRAST Result Date: 09/06/2023 CLINICAL DATA:  Bowel obstruction suspected. History of small-bowel obstruction and colostomy with prolapse of stoma. EXAM: CT ABDOMEN AND PELVIS WITH CONTRAST TECHNIQUE: Multidetector CT imaging of the abdomen and pelvis was performed using the standard protocol following bolus administration of intravenous contrast. RADIATION DOSE REDUCTION: This exam was performed according to the departmental dose-optimization program which includes automated exposure control, adjustment of the mA and/or kV according to patient size and/or use of iterative reconstruction technique. CONTRAST:  OMNIPAQUE  IOHEXOL  300 MG/ML  SOLN COMPARISON:  Abdominopelvic CT 07/15/2023 and 06/22/2023. FINDINGS: Lower chest: Progressive dependent airspace opacities at both lung bases may reflect atelectasis. Aspiration not excluded. Trace bilateral pleural effusions. Aortic and coronary artery atherosclerosis noted. Hepatobiliary: The liver is normal in density without suspicious focal abnormality. Stable dependent small gallstones. No gallbladder wall thickening or biliary dilatation. Pancreas: Unremarkable. No pancreatic ductal dilatation or surrounding inflammatory changes. Spleen: Normal in size without focal abnormality. Adrenals/Urinary Tract: Unchanged bilateral adrenal nodules, characterized as adenomas on noncontrast imaging. These measure 1.9 x 1.6 cm on the right (image 24/2) and 2.6 x 1.5 cm on the left (image 37/2); no follow-up imaging recommended. Stable bilateral renal cortical nodularity and scarring. No suspicious renal lesions. No urinary tract calculus or hydronephrosis. The bladder appears unremarkable for its degree of distention. Stomach/Bowel: Nasogastric tube extends into the distal stomach. Enteric contrast has passed from the stomach into a moderately distended duodenum and jejunum. The  distal small bowel appears decompressed. Ill-defined transition point is noted in the right mid abdomen without clear etiology for the obstruction. The appendix and proximal colon are decompressed proximal to a distal transverse loop colostomy which appears unchanged. As before, there is peristomal herniation of fat, but no herniated small bowel. The distal colon is also decompressed with some retained barium. Vascular/Lymphatic: There are no enlarged abdominal or pelvic lymph nodes. Aortic and branch vessel atherosclerosis without evidence of aneurysm or large vessel occlusion. Reproductive: Stable appearance of the prostate gland and seminal vesicles. Other: As above, stable parastomal herniation of fat around the distal transverse colostomy. No herniated bowel stable trace ascites in the left pericolic gutter. No evidence of pneumoperitoneum or focal extraluminal fluid collection. Musculoskeletal: No acute or significant osseous findings. Previous left hip arthroplasty. IMPRESSION: 1. Recurrent mid small bowel obstruction without clear demonstrated etiology, likely due to adhesions. Ill-defined transition point noted in the right mid abdomen. 2. Stable distal transverse loop colostomy with peristomal herniation of fat. No herniated bowel. 3. Progressive dependent airspace opacities at both lung bases may reflect atelectasis or aspiration. Trace bilateral pleural effusions. 4. Stable cholelithiasis. 5.  Aortic Atherosclerosis (ICD10-I70.0). Electronically Signed   By: Elsie Perone M.D.   On: 09/06/2023 16:12   DG Abd 1 View Result Date: 09/05/2023 CLINICAL DATA:  Check gastric catheter placement EXAM: ABDOMEN - 1 VIEW COMPARISON:  09/03/2023 FINDINGS: Gastric catheter is noted within the stomach. Scattered small bowel dilatation is noted slightly increased when compared with the prior exam. No free air is noted. IMPRESSION: Gastric catheter within the stomach. Increased small bowel dilatation is noted when  compared with the prior exam. Electronically Signed   By: Oneil Devonshire M.D.   On: 09/05/2023 17:45        Scheduled Meds:  baclofen   5 mg Oral BID   budesonide  (PULMICORT ) nebulizer solution  0.25 mg Nebulization BID   guaiFENesin   600 mg Oral Q12H   [START ON 09/08/2023] insulin   aspart  0-9 Units Subcutaneous Q4H   ipratropium-albuterol   3 mL Inhalation BID   lactulose   20 g Oral Daily   lamoTRIgine   50 mg Oral BID   melatonin  10 mg Oral QHS   mometasone -formoterol   2 puff Inhalation BID   oseltamivir   30 mg Oral BID   pantoprazole  (PROTONIX ) IV  40 mg Intravenous Q12H   polyethylene glycol  17 g Oral BID   predniSONE   40 mg Oral Q breakfast   pregabalin   200 mg Oral QHS   tamsulosin   0.4 mg Oral QPM   Continuous Infusions:  ampicillin -sulbactam (UNASYN ) IV 3 g (09/07/23 0515)   dextrose  5 % and 0.45 % NaCl 100 mL/hr at 09/06/23 2236   levETIRAcetam  1,000 mg (09/07/23 0921)     LOS: 4 days     Devaughn KATHEE Ban, MD [facility]   If 7PM-7AM, please contact night-coverage [email or web address] Password [email or web address] 09/07/2023, 10:50 AM

## 2023-09-07 NOTE — Progress Notes (Signed)
 Initial Nutrition Assessment  DOCUMENTATION CODES:   Not applicable  INTERVENTION:   -RD will follow for diet advancement and add supplements as appropriate -TPN management per pharmacy -MVI daily via TPN -100 mg thiamine  x 7 days -Pharmacy to monitor electrolyte and replete as needed; pt is at high risk for refeeding syndrome   NUTRITION DIAGNOSIS:   Inadequate oral intake related to altered GI function as evidenced by NPO status.  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  Pt admitted with flu A, CAP, and SBO.    12/31- s/p BSE- plan for dysphagia 1 diet with nectar thick liquids when cleared for diet per SLP 1/1- NGT placed (per KUB on 09/05/23 revealed tip of tube in stomach)   Reviewed I/O's: -2.7 L x 24 hours and -5.6 L since admission  UOP: 1.9 L x 24 hours   NGT output: 850 ml x 24 hours   Per [facility] notes, pt with unstageable bilateral heel ulcers.   Per H&P, pt has had abdominal pain cramping with nausea and vomiting for 1 day PTA.  Per general surgery notes, CT on 09/06/23 concerning for SBO with dilation of proximal bowel. Right colon and transverse colon appear decompressed, so likely prolapse is not causing these obstructive presentation but may be secondary to adhesions. Case discussed with RN, who confirms tentative plan for surgery (exploration, LOA, and ostomy revision) on 09/11/23 if pt does not improve.   Pt NPO with NGT connected to decompression.   Pt receiving nursing care at time  of visit. Pt confused and unable to provide much history. Per RN, pt has been like this all shift. Pt has had a lot of coughing and complains of abdominal pain.   Pt is a resident of [facility].   Reviewed wt hx; wt has been stable over the past year.   Case discussed with pharmacist, who confirms plan for TPN and PICC placement today. Discussed calorie needs and recommendations. Given TPN limitations secondary to IV fluid shortage, anticipate that pt will be unable to meet nutritional needs with TPN.   Medications reviewed and include lactulose , melatonin, protonix , tamiflu , prednisone , and thiamine .  Lab Results  Component Value Date   HGBA1C 5.8 (H) 05/28/2022   PTA DM medications are none.   Labs reviewed: CBGS: 95 (inpatient orders for glycemic control are none).    NUTRITION - FOCUSED PHYSICAL EXAM:  Flowsheet Row Most Recent Value  Orbital Region No depletion  Upper Arm Region No depletion  Thoracic and Lumbar Region No depletion  Buccal Region No depletion  Temple Region No depletion  Clavicle Bone Region No depletion  Clavicle and Acromion Bone Region No depletion  Scapular Bone Region No depletion  Dorsal Hand No depletion  Patellar Region No depletion  Anterior Thigh Region No depletion  Posterior Calf Region No depletion  Edema (RD Assessment) Mild  Hair Reviewed  Eyes Reviewed  Mouth Reviewed  Skin Reviewed  Nails Reviewed       Diet Order:   Diet Order  Diet NPO time specified Except for: Sips with Meds, Ice Chips  Diet effective now                   EDUCATION NEEDS:   Not appropriate for education at this time  Skin:  Skin Assessment: Skin Integrity Issues: Skin Integrity Issues:: Unstageable Unstageable: rt and lt heels  Last BM:  09/06/22 (type 7)  Height:   Ht Readings from Last 1 Encounters:  09/04/23 5' 11 (1.803 m)    Weight:   Wt Readings from Last 1 Encounters:  09/04/23 93.1 kg    Ideal Body Weight:  78.1  kg  BMI:  Body mass index is 28.63 kg/m.  Estimated Nutritional Needs:   Kcal:  2150-2350  Protein:  105-120 grams  Fluid:  2-2.2 L    Margery ORN, RD, LDN, CDCES Registered Dietitian III Certified Diabetes Care and Education Specialist If unable to reach this RD, please use RD Inpatient group chat on secure chat between hours of 8am-4 pm daily

## 2023-09-07 NOTE — Progress Notes (Signed)
 [facility] SURGICAL PROGRESS NOTE (cpt [phone])  Hospital Day(s): 4.   Interval History: Patient seen and examined. Nothing acute overnight reportedly. This morning, he continues to have LUQ pain. He has prolapsed his ostomy again. No fever, chills, nausea. He continues without leukocytosis; WBC 4.0K. Hgb to 8.7; stable. Renal function normalized; sCr - 1.03/BUN 10, eGFR >60; UO - 1850 ccs. No electrolyte derangements. NGT in place; output 800 ccs. He is having ostomy output.    Review of Systems:  Constitutional: denies fever, chills  HEENT: denies cough or congestion  Respiratory: denies any shortness of breath  Cardiovascular: denies chest pain or palpitations  Gastrointestinal: + abdominal pain, denied N/V Genitourinary: denies burning with urination or urinary frequency Musculoskeletal: denies pain, decreased motor or sensation  Vital signs in last 24 hours: [min-max] current  Temp:  [97.6 F (36.4 C)-98.3 F (36.8 C)] 97.7 F (36.5 C) (01/03 0322) Pulse Rate:  [68-98] 98 (01/03 0322) Resp:  [16-20] 20 (01/03 0322) BP: (100-144)/(60-127) 135/85 (01/03 0322) SpO2:  [91 %-99 %] 91 % (01/03 0322) FiO2 (%):  [28 %] 28 % (01/02 1926)     Height: 5' 11 (180.3 cm) Weight: 93.1 kg BMI (Calculated): 28.64   Intake/Output last 2 shifts:  01/02 0701 - 01/03 0700 In: -  Out: 2700 [Urine:1850; Emesis/NG output:850]   Physical Exam:  Constitutional: alert, cooperative and no distress  HENT: normocephalic without obvious abnormality; NGT in place Eyes: PERRL, EOM's grossly intact and symmetric  Respiratory: breathing non-labored at rest  Cardiovascular: regular rate and sinus rhythm  Gastrointestinal: soft, non-tender, and non-distended. Colostomy again prolapsed but reduced at bedside; liquid stool in bag. Abdominal binder in place Genitourinary: Foley in place; good UO Musculoskeletal: no edema or wounds, motor and sensation grossly intact, NT    Labs:     Latest  Ref Rng & Units 09/07/2023    3:51 AM 09/06/2023    6:22 AM 09/05/2023    4:27 AM  CBC  WBC 4.0 - 10.5 K/uL 4.0  6.2  9.1   Hemoglobin 13.0 - 17.0 g/dL 8.7  8.8  8.4   Hematocrit 39.0 - 52.0 % 28.3  28.9  27.6   Platelets 150 - 400 K/uL 168  192  185       Latest Ref Rng & Units 09/07/2023    3:51 AM 09/06/2023    6:22 AM 09/05/2023    4:27 AM  CMP  Glucose 70 - 99 mg/dL 88  891  890   BUN 8 - 23 mg/dL 10  15  17    Creatinine 0.61 - 1.24 mg/dL 8.96  8.93  8.74   Sodium 135 - 145 mmol/L 140  137  137   Potassium 3.5 - 5.1 mmol/L 3.5  4.0  4.9   Chloride 98 - 111 mmol/L 106  103  105   CO2 22 - 32 mmol/L 25  25  23    Calcium  8.9 - 10.3 mg/dL 7.9  7.7  7.8     Imaging studies: No new pertinent imaging studies   Assessment/Plan: (ICD-10's: K74.609) 73 y.o. 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 Right colon and transverse colon appear decompressed, so likely prolapse is not causing these obstructive presentation but may truly be secondary to adhesions. However, this prolapse will need to  be addressed. We will continue to manage this as such conservatively. Should he fail to improve, we may need to consider exploration, LOA, and ostomy revision next early week. He is agreeable with this. Tentatively plan for Tuesday (01/07) with Dr Desiderio. This will also allow for PNA to improve.    - For now, continue NPO. Okay for a few ice chips for comfort - Will benefit from PICC & TPN today; ordered   - Continue NGT decompression; LIS; monitor and record output   - Monitor abdominal examination - Monitor colostomy output; record - Continue abdominal binder - Pain control prn; antiemetics prn - Further management per primary service; we will follow    All of the above  findings and recommendations were discussed with the patient, and the medical team, and all of patient's questions were answered to his expressed satisfaction.  -- Arthea Platt, PA-C [facility] 09/07/2023, 7:21 AM M-F: 7am - 4pm

## 2023-09-07 NOTE — Progress Notes (Signed)
 At bedside to place PICC. Patient in bed with HOB elevated and can not tolerate lowering the HOB. He does have a wet congested cough at this time.MD aware, will revisit at a later time.

## 2023-09-07 NOTE — Progress Notes (Signed)
 Peripherally Inserted Central Catheter Placement  The IV Nurse has discussed with the patient and/or persons authorized to consent for the patient, the purpose of this procedure and the potential benefits and risks involved with this procedure.  The benefits include less needle sticks, lab draws from the catheter, and the patient may be discharged home with the catheter. Risks include, but not limited to, infection, bleeding, blood clot (thrombus formation), and puncture of an artery; nerve damage and irregular heartbeat and possibility to perform a PICC exchange if needed/ordered by physician.  Alternatives to this procedure were also discussed.  Bard Power PICC patient education guide, fact sheet on infection prevention and patient information card has been provided to patient /or left at bedside.   Consent obtained with daughter via telephone  PICC Placement Documentation  PICC Double Lumen 09/07/23 Right Basilic 40 cm 0 cm (Active)  Indication for Insertion or Continuance of Line Administration of hyperosmolar/irritating solutions (i.e. TPN, Vancomycin , etc.) 09/07/23 1700  Exposed Catheter (cm) 0 cm 09/07/23 1700  Site Assessment Clean, Dry, Intact 09/07/23 1700  Lumen #1 Status Flushed;Saline locked;Blood return noted 09/07/23 1700  Lumen #2 Status Flushed;Saline locked;Blood return noted 09/07/23 1700  Dressing Type Transparent;Securing device 09/07/23 1700  Dressing Status Antimicrobial disc in place;Clean, Dry, Intact 09/07/23 1700  Line Care Connections checked and tightened 09/07/23 1700  Line Adjustment (NICU/IV Team Only) No 09/07/23 1700  Dressing Intervention New dressing 09/07/23 1700  Dressing Change Due 09/14/23 09/07/23 1700       Calvin [name] [name] 09/07/2023, 5:06 PM
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
## 2023-09-07 NOTE — Progress Notes (Signed)
 SLP Cancellation Note  Patient Details Name: [name] MRN: [id number] DOB: December 20, 1950   Cancelled treatment:       Reason Eval/Treat Not Completed: Medical issues which prohibited therapy. Pt continues NPO per surgery. Will follow up after the weekend.  Armeda Plumb B. Dory, MSP, CCC-SLP Speech Language Pathologist  Dory Caprice Daring 09/07/2023, 2:35 PM

## 2023-09-07 NOTE — Plan of Care (Signed)
   Problem: Education: Goal: Knowledge of General Education information will improve Description Including pain rating scale, medication(s)/side effects and non-pharmacologic comfort measures Outcome: Progressing   Problem: Health Behavior/Discharge Planning: Goal: Ability to manage health-related needs will improve Outcome: Progressing   Problem: Clinical Measurements: Goal: Ability to maintain clinical measurements within normal limits will improve Outcome: Progressing Goal: Will remain free from infection Outcome: Progressing Goal: Diagnostic test results will improve Outcome: Progressing Goal: Respiratory complications will improve Outcome: Progressing Goal: Cardiovascular complication will be avoided Outcome: Progressing   Problem: Activity: Goal: Risk for activity intolerance will decrease Outcome: Progressing   Problem: Coping: Goal: Level of anxiety will decrease Outcome: Progressing   Problem: Elimination: Goal: Will not experience complications related to bowel motility Outcome: Progressing Goal: Will not experience complications related to urinary retention Outcome: Progressing

## 2023-09-08 ENCOUNTER — Inpatient Hospital Stay: Payer: Medicare Other

## 2023-09-08 DIAGNOSIS — J9601 Acute respiratory failure with hypoxia: Secondary | ICD-10-CM | POA: Diagnosis not present

## 2023-09-08 LAB — COMPREHENSIVE METABOLIC PANEL
ALT: 13 U/L (ref 0–44)
AST: 16 U/L (ref 15–41)
Albumin: 2.5 g/dL — ABNORMAL LOW (ref 3.5–5.0)
Alkaline Phosphatase: 36 U/L — ABNORMAL LOW (ref 38–126)
Anion gap: 10 (ref 5–15)
BUN: 11 mg/dL (ref 8–23)
CO2: 25 mmol/L (ref 22–32)
Calcium: 8.1 mg/dL — ABNORMAL LOW (ref 8.9–10.3)
Chloride: 105 mmol/L (ref 98–111)
Creatinine, Ser: 0.84 mg/dL (ref 0.61–1.24)
GFR, Estimated: >60 mL/min (ref >60)
Glucose, Bld: 132 mg/dL — ABNORMAL HIGH (ref 70–99)
Potassium: 3.7 mmol/L (ref 3.5–5.1)
Sodium: 140 mmol/L (ref 135–145)
Total Bilirubin: 0.3 mg/dL (ref 0.0–1.2)
Total Protein: 5.8 g/dL — ABNORMAL LOW (ref 6.5–8.1)

## 2023-09-08 LAB — CULTURE, BLOOD (ROUTINE X 2)
Culture: NO GROWTH
Culture: NO GROWTH

## 2023-09-08 LAB — GLUCOSE, CAPILLARY
Glucose-Capillary: 111 mg/dL — ABNORMAL HIGH (ref 70–99)
Glucose-Capillary: 121 mg/dL — ABNORMAL HIGH (ref 70–99)
Glucose-Capillary: 132 mg/dL — ABNORMAL HIGH (ref 70–99)
Glucose-Capillary: 134 mg/dL — ABNORMAL HIGH (ref 70–99)
Glucose-Capillary: 134 mg/dL — ABNORMAL HIGH (ref 70–99)
Glucose-Capillary: 159 mg/dL — ABNORMAL HIGH (ref 70–99)

## 2023-09-08 LAB — PHOSPHORUS: Phosphorus: 2.8 mg/dL (ref 2.5–4.6)

## 2023-09-08 LAB — CBC
HCT: 25.9 % — ABNORMAL LOW (ref 39.0–52.0)
Hemoglobin: 8.1 g/dL — ABNORMAL LOW (ref 13.0–17.0)
MCH: 29.5 pg (ref 26.0–34.0)
MCHC: 31.3 g/dL (ref 30.0–36.0)
MCV: 94.2 fL (ref 80.0–100.0)
Platelets: 160 10*3/uL (ref 150–400)
RBC: 2.75 MIL/uL — ABNORMAL LOW (ref 4.22–5.81)
RDW: 14 % (ref 11.5–15.5)
WBC: 3 10*3/uL — ABNORMAL LOW (ref 4.0–10.5)
nRBC: 0 % (ref 0.0–0.2)

## 2023-09-08 LAB — MAGNESIUM: Magnesium: 2.1 mg/dL (ref 1.7–2.4)

## 2023-09-08 MED ORDER — DEXTROSE 70 % IV SOLN
INTRAVENOUS | Status: AC
  Filled 2023-09-08: qty 1056

## 2023-09-08 NOTE — Plan of Care (Signed)

## 2023-09-08 NOTE — Plan of Care (Signed)

## 2023-09-08 NOTE — Progress Notes (Signed)
 RN educated patent on necessity for NG tube due to bowel obstruction. Patient allowed RN to attempt to put in NG tube. Patient was given pain meds prior to insertion RN successfully inserted NG tube w/ assistance of Surveyor, Quantity. Confirmed placement with bowel sounds auscultation and notified Erminio Cone NP.
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
## 2023-09-08 NOTE — Progress Notes (Signed)
 PROGRESS NOTE    Calvin [name]  FMW:[id number] DOB: 06-03-1951 DOA: 09/03/2023 PCP: Laurine Gladden, MD     Brief Narrative:   From admission h and p  Calvin [name] is a 73 y.o. 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   Symptoms started yesterday patient started to develop cramping-like abdominal pain, centrally located, associated with nauseous vomiting of stomach content, multiple times denies any blood or bile in the vomitus, no fever or chills.  Overnight patient started to have productive cough with light-colored phlegm no chest pains.   Assessment & Plan:   Principal Problem:   Acute respiratory failure with hypoxia (HCC) Active Problems:   Seizure (HCC)   Ogilvie syndrome   Paroxysmal atrial flutter (HCC)   Chronic diastolic CHF (congestive heart failure) (HCC)   Essential hypertension   CAD (coronary artery disease)   COPD (chronic obstructive pulmonary disease) (HCC)   History of CVA (cerebrovascular accident)   Sepsis (HCC)  # Flu A Slowly improving - continue tamiflu  to complete 5 day course  # CAP LLL infiltrate vs edema. Aspiration risk, worth covering for that. cefepme/flagyl >unasyn , Abx started 12/30, now s/p 5 days treatment - monitor off abx  # Sepsis, severe By fever, tachycardia, elevated lactic acid. Hemodynamically stable. Lactate normalized - continue fluids while npo  # Acute hypoxic respiratory failure 2/2 flu/cap, currently on 2 liters and stable, satting well - wean [place] o2 as able  # SBO History ogilvie syndrome s/p colostomy, here with some vomiting, kub showing signs of obstruction. Gen surg following. Several episodes emesis 1/1, ng tube  placed. - NG tube in place - TPN running - surgery remains a possibility  # Delirium Secondary to above processes - mitts to safely continue NG tube  # Anemia Hgb has drifted from baseline of 11-12 to 8s and now relatively stable there. discussed with GI Dr. 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 Worsening - has binder - gen surg following, possible revision next week  # COPD Some wheeze, ill as above - cont prednisone  while treating for flu  # AKI on ckd 2 Prerenal 2/2 dehydration, resolved w/ fluids - continue IVF while npo  # Lactic acidosis 2/2 above processes, resolved - continue IVF   # Seizure disorder With recent hospitalization for breakthrough seizures - continue keppra , home lamictal  and pregabalin   # hx CVA  With hemiplegia - hold asa given anemia as above  # BPH  - cont home flomax   # HFpEF Dehydrated as above - judicious IV fluids  # Debility Resides at snf - PT consulted, advising SNF  DVT prophylaxis: scds Code Status: full Family Communication: daughter stephanie updated telephonically 1/2. Brother updated telephonically 1/3. No answer when daughter called today, message with update left.  Level of care: Telemetry Medical Status is: Inpatient Remains inpatient appropriate because: severity of illness    Consultants:  Gen surg  Procedures: none  Antimicrobials:  See above    Subjective: Reports  ongoing abdominal pain. confused  Objective: Vitals:   09/08/23 0433 09/08/23 0500 09/08/23 0815 09/08/23 1157  BP: 136/69  122/77 115/63  Pulse: 78  77 76  Resp: 18  16 17   Temp: 98 F (36.7 C)  (!) 97.4 F (36.3 C) 98.6 F (37 C)  TempSrc:       SpO2: 95%  100% 97%  Weight:  96.9 kg    Height:        Intake/Output Summary (Last 24 hours) at 09/08/2023 1536 Last data filed at 09/08/2023 0550 Gross per 24 hour  Intake 1300 ml  Output 3000 ml  Net -1700 ml   Filed Weights   09/04/23 1347 09/04/23 1701 09/08/23 0500  Weight: 97.8 kg 93.1 kg 96.9 kg    Examination:  General exam: chronically ill appearing Respiratory system: normal rate and wob, scattered rhonchi, rales at base Cardiovascular system: S1 & S2 heard, tachycardic Gastrointestinal system: Abdomen is distended, binder in place, greenish fluid from ng tube, diffuse mild tenderness Central nervous system: confused, Left sided weakness Extremities: warm, no edema Skin: No visible rashes, lesions or ulcers Psychiatry: calm, confused    Data Reviewed: I have personally reviewed following labs and imaging studies  CBC: Recent Labs  Lab 09/03/23 0308 09/04/23 0455 09/05/23 0427 09/06/23 0622 09/07/23 0351 09/08/23 0458  WBC 16.4* 12.8* 9.1 6.2 4.0 3.0*  NEUTROABS 13.2*  --   --   --   --   --   HGB 12.9* 9.6* 8.4* 8.8* 8.7* 8.1*  HCT 42.5 31.9* 27.6* 28.9* 28.3* 25.9*  MCV 99.1 101.3* 98.6 97.6 94.6 94.2  PLT 372 210 185 192 168 160   Basic Metabolic Panel: Recent Labs  Lab 09/04/23 0455 09/05/23 0427 09/06/23 0622 09/07/23 0351 09/08/23 0458  NA 136 137 137 140 140  K 4.2 4.9 4.0 3.5 3.7  CL 105 105 103 106 105  CO2 22 23 25 25 25   GLUCOSE 96 109* 108* 88 132*  BUN 19 17 15 10 11   CREATININE 1.60* 1.25* 1.06 1.03 0.84  CALCIUM  7.8* 7.8* 7.7* 7.9* 8.1*  MG  --   --   --  1.7 2.1  PHOS  --   --   --  2.5 2.8   GFR: Estimated Creatinine Clearance: 91.6 mL/min (by C-G formula based on SCr of 0.84 mg/dL). Liver Function Tests: Recent Labs  Lab 09/03/23 0308 09/07/23 0351 09/08/23 0458  AST 41 15 16  ALT 43 17 13  ALKPHOS 87 39 36*  BILITOT 0.7 0.5 0.3  PROT 8.6* 5.8* 5.8*  ALBUMIN  3.9 2.4* 2.5*   No results for input(s): LIPASE,  AMYLASE in the last 168 hours. No results for input(s): AMMONIA in the last 168 hours. Coagulation Profile: Recent Labs  Lab 09/03/23 0308  INR 1.1   Cardiac Enzymes: No results for input(s): CKTOTAL, CKMB, CKMBINDEX, TROPONINI in the last 168 hours. BNP (last 3 results) No results for input(s): PROBNP in the last 8760 hours. HbA1C: No results for input(s): HGBA1C in the last 72 hours. CBG: Recent Labs  Lab 09/07/23 1956 09/07/23 2336 09/08/23 0412 09/08/23 0756 09/08/23 1253  GLUCAP 135* 142* 121* 134* 111*   Lipid Profile: Recent Labs    09/07/23 0351  TRIG 108   Thyroid  Function Tests: No results for input(s): TSH, T4TOTAL, FREET4, T3FREE, THYROIDAB in the last 72 hours. Anemia Panel: Recent Labs    09/06/23 0622  VITAMINB12 395   Urine analysis:    Component Value Date/Time  COLORURINE AMBER (A) 09/03/2023 0543   APPEARANCEUR CLOUDY (A) 09/03/2023 0543   APPEARANCEUR Clear 08/14/2014 0933   LABSPEC 1.026 09/03/2023 0543   LABSPEC 1.015 08/14/2014 0933   PHURINE 5.0 09/03/2023 0543   GLUCOSEU NEGATIVE 09/03/2023 0543   GLUCOSEU Negative 08/14/2014 0933   HGBUR NEGATIVE 09/03/2023 0543   BILIRUBINUR NEGATIVE 09/03/2023 0543   BILIRUBINUR Negative 08/14/2014 0933   KETONESUR NEGATIVE 09/03/2023 0543   PROTEINUR 30 (A) 09/03/2023 0543   UROBILINOGEN 0.2 06/12/2010 0103   NITRITE NEGATIVE 09/03/2023 0543   LEUKOCYTESUR LARGE (A) 09/03/2023 0543   LEUKOCYTESUR Negative 08/14/2014 0933   Sepsis Labs: @LABRCNTIP (procalcitonin:4,lacticidven:4)  ) Recent Results (from the past 240 hours)  Blood Culture (routine x 2)     Status: None   Collection Time: 09/03/23  3:08 AM   Specimen: BLOOD  Result Value Ref Range Status   Specimen Description BLOOD BLOOD RIGHT ARM  Final   Special Requests   Final    BOTTLES DRAWN AEROBIC AND ANAEROBIC Blood Culture results may not be optimal due to an inadequate volume of blood received in  culture bottles   Culture   Final    NO GROWTH 5 DAYS Performed at [facility], [address] [place], [place] [address]    Report Status 09/08/2023 FINAL  Final  Resp panel by RT-PCR (RSV, Flu A&B, Covid) Anterior Nasal Swab     Status: Abnormal   Collection Time: 09/03/23  3:18 AM   Specimen: Anterior Nasal Swab  Result Value Ref Range Status   SARS Coronavirus 2 by RT PCR NEGATIVE NEGATIVE Final    Comment: (NOTE) SARS-CoV-2 target nucleic acids are NOT DETECTED.  The SARS-CoV-2 RNA is generally detectable in upper respiratory specimens during the acute phase of infection. The lowest concentration of SARS-CoV-2 viral copies this assay can detect is 138 copies/mL. A negative result does not preclude SARS-Cov-2 infection and should not be used as the sole basis for treatment or other patient management decisions. A negative result may occur with  improper specimen collection/handling, submission of specimen other than nasopharyngeal swab, presence of viral mutation(s) within the areas targeted by this assay, and inadequate number of viral copies(<138 copies/mL). A negative result must be combined with clinical observations, patient history, and epidemiological information. The expected result is Negative.  Fact Sheet for Patients:  bloggercourse.com  Fact Sheet for Healthcare Providers:  seriousbroker.it  This test is no t yet approved or cleared by the [place]  FDA and  has been authorized for detection and/or diagnosis of SARS-CoV-2 by FDA under an Emergency Use Authorization (EUA). This EUA will remain  in effect (meaning this test can be used) for the duration of the COVID-19 declaration under Section 564(b)(1) of the Act, 21 U.S.C.section 360bbb-3(b)(1), unless the authorization is terminated  or revoked sooner.       Influenza A by PCR POSITIVE (A) NEGATIVE Final   Influenza B by PCR NEGATIVE  NEGATIVE Final    Comment: (NOTE) The Xpert Xpress SARS-CoV-2/FLU/RSV plus assay is intended as an aid in the diagnosis of influenza from Nasopharyngeal swab specimens and should not be used as a sole basis for treatment. Nasal washings and aspirates are unacceptable for Xpert Xpress SARS-CoV-2/FLU/RSV testing.  Fact Sheet for Patients: bloggercourse.com  Fact Sheet for Healthcare Providers: seriousbroker.it  This test is not yet approved or cleared by the [place]  FDA and has been authorized for detection and/or diagnosis of SARS-CoV-2 by FDA under an Emergency Use Authorization (EUA). This  EUA will remain in effect (meaning this test can be used) for the duration of the COVID-19 declaration under Section 564(b)(1) of the Act, 21 U.S.C. section 360bbb-3(b)(1), unless the authorization is terminated or revoked.     Resp Syncytial Virus by PCR NEGATIVE NEGATIVE Final    Comment: (NOTE) Fact Sheet for Patients: bloggercourse.com  Fact Sheet for Healthcare Providers: seriousbroker.it  This test is not yet approved or cleared by the [place]  FDA and has been authorized for detection and/or diagnosis of SARS-CoV-2 by FDA under an Emergency Use Authorization (EUA). This EUA will remain in effect (meaning this test can be used) for the duration of the COVID-19 declaration under Section 564(b)(1) of the Act, 21 U.S.C. section 360bbb-3(b)(1), unless the authorization is terminated or revoked.  Performed at [facility], [address] [place], [place] [address]   Blood Culture (routine x 2)     Status: None   Collection Time: 09/03/23  3:18 AM   Specimen: BLOOD  Result Value Ref Range Status   Specimen Description BLOOD RIGHT ANTECUBITAL  Final   Special Requests   Final    BOTTLES DRAWN AEROBIC AND ANAEROBIC Blood Culture results may not be optimal due to an  inadequate volume of blood received in culture bottles   Culture   Final    NO GROWTH 5 DAYS Performed at [facility], [address] [place], [place] [address]    Report Status 09/08/2023 FINAL  Final  Urine Culture     Status: Abnormal   Collection Time: 09/03/23  5:43 AM   Specimen: Urine, Random  Result Value Ref Range Status   Specimen Description   Final    URINE, RANDOM Performed at [facility], [address], [place], [place] [address]    Special Requests   Final    NONE Reflexed from [id number] Performed at [facility], [address] [place], [place] [address]    Culture 30,000 COLONIES/mL STAPHYLOCOCCUS AUREUS (A)  Final   Report Status 09/05/2023 FINAL  Final   Organism ID, Bacteria STAPHYLOCOCCUS AUREUS (A)  Final      Susceptibility   Staphylococcus aureus - MIC*    CIPROFLOXACIN  >=8 RESISTANT Resistant     GENTAMICIN <=0.5 SENSITIVE Sensitive     NITROFURANTOIN <=16 SENSITIVE Sensitive     OXACILLIN >=4 RESISTANT Resistant     TETRACYCLINE <=1 SENSITIVE Sensitive     VANCOMYCIN  1 SENSITIVE Sensitive     TRIMETH/SULFA >=320 RESISTANT Resistant     RIFAMPIN <=0.5 SENSITIVE Sensitive     Inducible Clindamycin  NEGATIVE Sensitive     LINEZOLID 2 SENSITIVE Sensitive     * 30,000 COLONIES/mL STAPHYLOCOCCUS AUREUS  MRSA Next Gen by PCR, Nasal     Status: Abnormal   Collection Time: 09/03/23 11:00 AM   Specimen: Nasal Mucosa; Nasal Swab  Result Value Ref Range Status   MRSA by PCR Next Gen DETECTED (A) NOT DETECTED Final    Comment: RESULT CALLED TO, READ BACK BY AND VERIFIED WITH: JACOB MOORE @1703  09/03/23 MJU (NOTE) The GeneXpert MRSA Assay (FDA approved for NASAL specimens only), is one component of a comprehensive MRSA colonization surveillance program. It is not intended to diagnose MRSA infection nor to guide or monitor treatment for MRSA infections. Test performance is not FDA approved in patients less than 27  years old. Performed at [facility], [address], [place], [place] [address]          Radiology Studies: [place]  Abd 1 View Result Date: 09/08/2023 CLINICAL DATA:  Nasogastric tube placement EXAM: ABDOMEN - 1 VIEW COMPARISON:  09/07/2023 FINDINGS: Enteric tube with tip at the distal stomach, near the pylorus. Hazy density at the lung bases where there is atelectasis by recent CT. High-density within stool in the upper abdomen, unchanged. IMPRESSION: Enteric tube with tip near the pylorus. Electronically Signed   By: Dorn Roulette M.D.   On: 09/08/2023 04:31   DG Chest Port 1 View Result Date: 09/07/2023 CLINICAL DATA:  Confirm NG tube placement EXAM: PORTABLE CHEST 1 VIEW COMPARISON:  None Available. FINDINGS: NG tube enters the stomach, the tip not visualized on this chest study. Right PICC line in place, unchanged. Cardiomegaly with vascular congestion. Platelike atelectasis at the right lung base. Patchy opacities in the left lower lung, similar to prior study. No pneumothorax. No acute bony abnormality. IMPRESSION: NG tube enters the stomach, the tip not visualized on this chest x-ray. Right base atelectasis. Continued patchy opacity at the left lung base. Cardiomegaly, vascular congestion. Electronically Signed   By: Franky Crease M.D.   On: 09/07/2023 23:33   DG Abd 2 Views Result Date: 09/07/2023 CLINICAL DATA:  Small-bowel obstruction. EXAM: ABDOMEN - 2 VIEW COMPARISON:  Radiographs 09/05/2023.  CT 09/06/2023. FINDINGS: Tip of the enteric tube is in the right upper quadrant of the abdomen, likely in the distal stomach. Minimal improvement in multiple moderately dilated loops of proximal to mid small bowel consistent with ongoing small bowel obstruction. Chronic contrast within the decompressed descending colon distal to the loop transverse colostomy. No evidence of pneumoperitoneum. Atelectasis is present at both lung bases. IMPRESSION: Minimal improvement in small bowel  obstruction. Enteric tube in place. Electronically Signed   By: Elsie Perone M.D.   On: 09/07/2023 11:04   US  EKG SITE RITE Result Date: 09/07/2023 If Site Rite image not attached, placement could not be confirmed due to current cardiac rhythm.  US  EKG SITE RITE Result Date: 09/07/2023 If Site Rite image not attached, placement could not be confirmed due to current cardiac rhythm.       Scheduled Meds:  baclofen   5 mg Oral BID   budesonide  (PULMICORT ) nebulizer solution  0.25 mg Nebulization BID   Chlorhexidine  Gluconate Cloth  6 each Topical Q0600   guaiFENesin   600 mg Oral Q12H   insulin  aspart  0-9 Units Subcutaneous Q4H   ipratropium-albuterol   3 mL Inhalation BID   lactulose   20 g Oral Daily   lamoTRIgine   50 mg Oral BID   melatonin  10 mg Oral QHS   mometasone -formoterol   2 puff Inhalation BID   mupirocin  ointment  1 Application Nasal BID   pantoprazole  (PROTONIX ) IV  40 mg Intravenous Q12H   polyethylene glycol  17 g Oral BID   predniSONE   40 mg Oral Q breakfast   pregabalin   200 mg Oral QHS   sodium chloride  flush  10-40 mL Intracatheter Q12H   tamsulosin   0.4 mg Oral QPM   thiamine  (VITAMIN B1) injection  100 mg Intravenous Q24H   Continuous Infusions:  ampicillin -sulbactam (UNASYN ) IV 3 g (09/08/23 1251)   dextrose  5 % and 0.45 % NaCl 60 mL/hr at 09/07/23 1739   levETIRAcetam  1,000 mg (09/08/23 1150)   TPN ADULT (ION) 40 mL/hr at 09/07/23 1742   TPN ADULT (ION)       LOS: 5 days     Devaughn KATHEE Ban, MD [facility]   If 7PM-7AM, please contact night-coverage [email or web address] Password [facility] 09/08/2023,  3:36 PM

## 2023-09-08 NOTE — Progress Notes (Signed)
 RN went to round on patient, found ng tube had been pulled out. RN educated patient on reason for NG tube is to decompress bowel due to small bowel obstruction. Patient gave RN permission to reinsert NG tube. RN attemtped to reinsert NG tube. Patient became agitated and started to grab at RN's wrists and pulled NG tube back out saying that RN is only trying to hurt patient. Rn attempted to redirect patient and assure him RN is only trying to help patient. Patient kept insisting that RN was only trying to hurt patient. RN notified Erminio Cone NP.

## 2023-09-08 NOTE — Progress Notes (Signed)
 Subjective:  CC: Calvin [name] is a 73 y.o. male  Hospital stay day 5,   dilated bowel and ostomy prolapse  HPI: NG pulled last night and replaced.  Patient has no specific complaints but somewhat sleepy on exam today    Objective:   Temp:  [97.4 F (36.3 C)-98.1 F (36.7 C)] 97.4 F (36.3 C) (01/04 0815) Pulse Rate:  [77-110] 77 (01/04 0815) Resp:  [16-20] 16 (01/04 0815) BP: (122-150)/(69-82) 122/77 (01/04 0815) SpO2:  [79 %-100 %] 100 % (01/04 0815) Weight:  [96.9 kg] 96.9 kg (01/04 0500)     Height: 5' 11 (180.3 cm) Weight: 96.9 kg BMI (Calculated): 29.81   Intake/Output this shift:   Intake/Output Summary (Last 24 hours) at 09/08/2023 0843 Last data filed at 09/08/2023 0550 Gross per 24 hour  Intake 1300 ml  Output 3000 ml  Net -1700 ml    Constitutional :  alert, cooperative, appears stated age, and no distress  Respiratory:  clear to auscultation bilaterally  Cardiovascular:  regular rate and rhythm  Gastrointestinal: Soft, no guarding, distention at baseline.  Large ostomy prolapse that is still reducible with gentle pressure noted.  NG with more thin gastric output   .   Skin: Cool and moist.   Psychiatric: Normal affect, non-agitated, not confused       LABS:     Latest Ref Rng & Units 09/08/2023    4:58 AM 09/07/2023    3:51 AM 09/06/2023    6:22 AM  CMP  Glucose 70 - 99 mg/dL 867  88  891   BUN 8 - 23 mg/dL 11  10  15    Creatinine 0.61 - 1.24 mg/dL 9.15  8.96  8.93   Sodium 135 - 145 mmol/L 140  140  137   Potassium 3.5 - 5.1 mmol/L 3.7  3.5  4.0   Chloride 98 - 111 mmol/L 105  106  103   CO2 22 - 32 mmol/L 25  25  25    Calcium  8.9 - 10.3 mg/dL 8.1  7.9  7.7   Total Protein 6.5 - 8.1 g/dL 5.8  5.8    Total Bilirubin 0.0 - 1.2 mg/dL 0.3  0.5    Alkaline Phos 38 - 126 U/L 36  39    AST 15 - 41 U/L 16  15    ALT 0 - 44 U/L 13  17        Latest Ref Rng & Units 09/08/2023    4:58 AM 09/07/2023    3:51 AM 09/06/2023    6:22 AM  CBC  WBC 4.0 - 10.5 K/uL 3.0   4.0  6.2   Hemoglobin 13.0 - 17.0 g/dL 8.1  8.7  8.8   Hematocrit 39.0 - 52.0 % 25.9  28.3  28.9   Platelets 150 - 400 K/uL 160  168  192     RADS: N/a Assessment:   dilated bowel and ostomy prolapse.  Prolapse continues to remain reducible large NP output recorded but thin and bilious.  Review of imaging of replaced NG tube yesterday.  Recommend: Back 3 cm.  Nurse notified.  Continue current management for now.  Sign out indicating surgical intervention likely in the next few days as upper respiratory issues continue to improve.  labs/images/medications/previous chart entries reviewed personally and relevant changes/updates noted above.

## 2023-09-09 ENCOUNTER — Encounter: Payer: Self-pay | Admitting: Internal Medicine

## 2023-09-09 DIAGNOSIS — K9409 Other complications of colostomy: Secondary | ICD-10-CM | POA: Diagnosis not present

## 2023-09-09 DIAGNOSIS — K56609 Unspecified intestinal obstruction, unspecified as to partial versus complete obstruction: Secondary | ICD-10-CM | POA: Diagnosis not present

## 2023-09-09 DIAGNOSIS — J9601 Acute respiratory failure with hypoxia: Secondary | ICD-10-CM | POA: Diagnosis not present

## 2023-09-09 LAB — CBC
HCT: 24.4 % — ABNORMAL LOW (ref 39.0–52.0)
Hemoglobin: 7.6 g/dL — ABNORMAL LOW (ref 13.0–17.0)
MCH: 29.5 pg (ref 26.0–34.0)
MCHC: 31.1 g/dL (ref 30.0–36.0)
MCV: 94.6 fL (ref 80.0–100.0)
Platelets: 168 10*3/uL (ref 150–400)
RBC: 2.58 MIL/uL — ABNORMAL LOW (ref 4.22–5.81)
RDW: 13.9 % (ref 11.5–15.5)
WBC: 3.1 10*3/uL — ABNORMAL LOW (ref 4.0–10.5)
nRBC: 1.6 % — ABNORMAL HIGH (ref 0.0–0.2)

## 2023-09-09 LAB — GLUCOSE, CAPILLARY
Glucose-Capillary: 160 mg/dL — ABNORMAL HIGH (ref 70–99)
Glucose-Capillary: 118 mg/dL — ABNORMAL HIGH (ref 70–99)
Glucose-Capillary: 131 mg/dL — ABNORMAL HIGH (ref 70–99)
Glucose-Capillary: 123 mg/dL — ABNORMAL HIGH (ref 70–99)
Glucose-Capillary: 145 mg/dL — ABNORMAL HIGH (ref 70–99)

## 2023-09-09 MED ORDER — TRAVASOL 10 % IV SOLN
INTRAVENOUS | Status: AC
  Filled 2023-09-09: qty 1056

## 2023-09-09 NOTE — Plan of Care (Signed)

## 2023-09-09 NOTE — Plan of Care (Signed)
 Continue current plan of care.

## 2023-09-09 NOTE — Progress Notes (Signed)
 PHARMACY - TOTAL PARENTERAL NUTRITION CONSULT NOTE   Indication: Small bowel obstruction  Patient Measurements: Height: 5' 11 (180.3 cm) Weight: 96.6 kg (212 lb 15.4 oz) IBW/kg (Calculated) : 75.3 TPN AdjBW (KG): 93.1 Body mass index is 29.7 kg/m. Usual Weight: 93.1 kg  Assessment:  73 y/o male with PMH significant for seizure disorder, HFmrEF, CKD stage II, HTN, CVA, and colon cancer s/p transverse colectomy and colostomy. Patient presenting with abdominal pain and found to have small bowel obstruction. Pharmacy has been consulted to initiate TPN.  Glucose / Insulin : BG 111-160, SSI q4hours ordered(also receiving prednisone  40mg  daily) Insulin  needs last 24h: 7 units Electrolytes: K 3.7 Mg 2.1 Phos 2.8 Renal: Scr 1.03>0.84 (BL ~1.0-1.1) Hepatic: AST 16 ALT 13 Tbili 0.3 Triglycerides: 108 Intake / Output; MIVF: D5+1/2NS @ 100 mL/hour GI Imaging: 01/02 CT Abd:  Recurrent mid small bowel obstruction without clear demonstrated etiology, likely due to adhesions. Ill-defined transition point noted in the right mid abdomen GI Surgeries / Procedures:  Surgery to manage conservatively for now but will tentatively plan for exploration, lysis of adhesions, and ostomy revision early next week.  Central access: PICC TPN start date: 09/07/2023  Nutritional Goals: Due to unforeseen natural disaster and resultant supply constraints, TPN will be customized to target nutritional needs based on available premix products.  Goal TPN rate is 90 mL/hr (Which would provide 118 g of protein and 2224 kcals per day)  RD Assessment: Estimated Needs Total Energy Estimated Needs: 2150-2350 Total Protein Estimated Needs: 105-120 grams Total Fluid Estimated Needs: 2-2.2 L  Current Nutrition:  NPO  Plan:  Continue TPN at goal rate of 80 mL/hr at 1800 (will provide 1992 kcals) Electrolytes in TPN: Na 70mEq/L, K 64mEq/L, Ca 5 mEq/L, Mg 51mEq/L, and Phos 15mmol/L. Cl:Ac 1:1 Add standard MVI and trace  elements to TPN Continue Sensitive q4h SSI and adjust as needed  Monitor TPN labs daily until stable, then on Mon/Thurs  Thank you for involving pharmacy in this patient's care.   Tashi Band A Jani Ploeger, PharmD Clinical Pharmacist 09/09/2023 1:30 PM

## 2023-09-09 NOTE — Progress Notes (Signed)
 09/09/2023  Subjective: No acute events overnight.  NG tube remained in place.  Output 900 ml over 24 hrs.  Reports not feeling good and still having pain in both right and left abdomen.  WBC stable, Hgb down to 7.6, likely combination of dilutional and chronic disease.  Vital signs: Temp:  [97.3 F (36.3 C)-98.8 F (37.1 C)] 98.8 F (37.1 C) (01/05 0722) Pulse Rate:  [56-81] 59 (01/05 0722) Resp:  [17-20] 20 (01/05 0722) BP: (115-151)/(63-90) 151/68 (01/05 0722) SpO2:  [97 %-100 %] 100 % (01/05 0722) Weight:  [96.6 kg] 96.6 kg (01/05 0411)   Intake/Output: 01/04 0701 - 01/05 0700 In: -  Out: 2000 [Urine:1100; Emesis/NG output:900] Last BM Date : 09/07/23  Physical Exam: Constitutional: No acute distress Abdomen:  soft, mildly distended, with mild tenderness in right abdomen and around his ostomy on the left. Ostomy prolapse remains reducible.  There is liquid stool in the bag this morning.  Labs:  Recent Labs    09/08/23 0458 09/09/23 0724  WBC 3.0* 3.1*  HGB 8.1* 7.6*  HCT 25.9* 24.4*  PLT 160 168   Recent Labs    09/07/23 0351 09/08/23 0458  NA 140 140  K 3.5 3.7  CL 106 105  CO2 25 25  GLUCOSE 88 132*  BUN 10 11  CREATININE 1.03 0.84  CALCIUM  7.9* 8.1*   No results for input(s): LABPROT, INR in the last 72 hours.  Imaging: No results found.  Assessment/Plan: This is a 73 y.o. male with small bowel obstruction and colostomy prolapse.  --Patient has liquid stool in his ostomy bag, but NG output is still high.  Likely reflects partial obstruction.  Patient still having discomfort in both right side where the transition point is and left side around his ostomy. --Discussed with him that for now plan remains the same for Tuesday 1/7 for exlap, lysis of adhesions, and ostomy revision. --For now, continue NG tube to suction, NPO, TPN, and management of his pneumonia/flu. --Will continue to follow with you.   I spent 35 minutes dedicated to the care of  this patient on the date of this encounter to include pre-visit review of records, face-to-face time with the patient discussing diagnosis and management, and any post-visit coordination of care.  Aloysius Sheree Plant, MD [facility]

## 2023-09-09 NOTE — Progress Notes (Signed)
 PROGRESS NOTE    Calvin [name]  FMW:[id number] DOB: 1951/01/08 DOA: 09/03/2023 PCP: Laurine Gladden, MD     Brief Narrative:   From admission h and p  Calvin [name] is a 73 y.o. 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   Symptoms started yesterday patient started to develop cramping-like abdominal pain, centrally located, associated with nauseous vomiting of stomach content, multiple times denies any blood or bile in the vomitus, no fever or chills.  Overnight patient started to have productive cough with light-colored phlegm no chest pains.   Assessment & Plan:   Principal Problem:   Acute respiratory failure with hypoxia (HCC) Active Problems:   Seizure (HCC)   Ogilvie syndrome   Paroxysmal atrial flutter (HCC)   Chronic diastolic CHF (congestive heart failure) (HCC)   Essential hypertension   CAD (coronary artery disease)   COPD (chronic obstructive pulmonary disease) (HCC)   History of CVA (cerebrovascular accident)   Sepsis (HCC)   Colostomy prolapse (HCC)  # Flu A Slowly improving, s/p tamiflu   # CAP LLL infiltrate vs edema. Aspiration risk, worth covering for that. cefepme/flagyl >unasyn , Abx started 12/30, now s/p 5 days treatment - monitor off abx  # Sepsis, severe By fever, tachycardia, elevated lactic acid. Hemodynamically stable. Lactate normalized - continue fluids while npo  # Acute hypoxic respiratory failure 2/2 flu/cap, currently on 2 liters and stable, satting well - wean [place] o2 as able  # SBO History ogilvie syndrome s/p colostomy, here with some vomiting, kub showing signs of obstruction. Gen surg following. Several episodes emesis 1/1, ng tube placed. - NG  tube in place - TPN running - surgery remains a possibility, scheduled for 1/7 for lysis of adhesions and parastomal hernia repair  # Delirium Secondary to above processes - mitts to safely continue NG tube  # Anemia Hgb has drifted from baseline of 11-12 to 8s and now relatively stable there though slight decrease today to 7.6, no bleeding. discussed with GI Dr. 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 Worsening - has binder - revision 1/7, tentative plan  # COPD Some wheeze, ill as above. S/p 6 days prednisone  - d/c prednisone   # AKI on ckd 2 Prerenal 2/2 dehydration, resolved w/ fluids - continue IVF while npo  # Lactic acidosis 2/2 above processes, resolved - continue IVF   # Seizure disorder With recent hospitalization for breakthrough seizures - continue keppra , home lamictal  and pregabalin   # hx CVA  With hemiplegia - hold asa given anemia as above  # BPH  - cont home flomax   # HFpEF Dehydrated as above - judicious IV fluids  # Debility Resides at snf - PT consulted, advising SNF  DVT prophylaxis: scds Code Status: full Family Communication: daughter stephanie updated at bedside 1/5  Level of care: Telemetry Medical Status is: Inpatient Remains inpatient appropriate because: severity of illness    Consultants:  Gen surg  Procedures: none  Antimicrobials:  See above  Subjective: Reports ongoing abdominal pain, stable  Objective: Vitals:   09/09/23 0411 09/09/23 0722 09/09/23 1154 09/09/23 1609  BP:  (!) 151/68 (!) 147/72 (!) 171/84  Pulse:  (!) 59 62 70  Resp:  20 20 20   Temp:  98.8 F (37.1 C) 98.4 F (36.9 C) 97.7 F (36.5 C)  TempSrc:    Oral   SpO2:  100% 100% 98%  Weight: 96.6 kg     Height:        Intake/Output Summary (Last 24 hours) at 09/09/2023 1616 Last data filed at 09/09/2023 1559 Gross per 24 hour  Intake 520 ml  Output 4225 ml  Net -3705 ml   Filed Weights   09/04/23 1701 09/08/23 0500 09/09/23 0411  Weight: 93.1 kg 96.9 kg 96.6 kg    Examination:  General exam: chronically ill appearing Respiratory system: normal rate and wob, scattered rhonchi, rales at base Cardiovascular system: S1 & S2 heard, tachycardic Gastrointestinal system: Abdomen is distended, binder in place, greenish fluid from ng tube, diffuse mild tenderness. Ostomy in place small amount brown liquid stool. Central nervous system: confused, Left sided weakness Extremities: warm, trace LE edema Skin: No visible rashes, lesions or ulcers Psychiatry: calm    Data Reviewed: I have personally reviewed following labs and imaging studies  CBC: Recent Labs  Lab 09/03/23 0308 09/04/23 0455 09/05/23 0427 09/06/23 0622 09/07/23 0351 09/08/23 0458 09/09/23 0724  WBC 16.4*   < > 9.1 6.2 4.0 3.0* 3.1*  NEUTROABS 13.2*  --   --   --   --   --   --   HGB 12.9*   < > 8.4* 8.8* 8.7* 8.1* 7.6*  HCT 42.5   < > 27.6* 28.9* 28.3* 25.9* 24.4*  MCV 99.1   < > 98.6 97.6 94.6 94.2 94.6  PLT 372   < > 185 192 168 160 168   < > = values in this interval not displayed.   Basic Metabolic Panel: Recent Labs  Lab 09/04/23 0455 09/05/23 0427 09/06/23 0622 09/07/23 0351 09/08/23 0458  NA 136 137 137 140 140  K 4.2 4.9 4.0 3.5 3.7  CL 105 105 103 106 105  CO2 22 23 25 25 25   GLUCOSE 96 109* 108* 88 132*  BUN 19 17 15 10 11   CREATININE 1.60* 1.25* 1.06 1.03 0.84  CALCIUM  7.8* 7.8* 7.7* 7.9* 8.1*  MG  --   --   --  1.7 2.1  PHOS  --   --   --  2.5 2.8   GFR: Estimated Creatinine Clearance: 91.4 mL/min (by C-G formula based on SCr of 0.84 mg/dL). Liver Function Tests: Recent Labs  Lab 09/03/23 0308 09/07/23 0351 09/08/23 0458  AST 41 15 16   ALT 43 17 13  ALKPHOS 87 39 36*  BILITOT 0.7 0.5 0.3  PROT 8.6* 5.8* 5.8*  ALBUMIN  3.9 2.4* 2.5*   No results for input(s): LIPASE, AMYLASE in the last 168 hours. No results for input(s): AMMONIA in the last 168 hours. Coagulation Profile: Recent Labs  Lab 09/03/23 0308  INR 1.1   Cardiac Enzymes: No results for input(s): CKTOTAL, CKMB, CKMBINDEX, TROPONINI in the last 168 hours. BNP (last 3 results) No results for input(s): PROBNP in the last 8760 hours. HbA1C: No results for input(s): HGBA1C in the last 72 hours. CBG: Recent Labs  Lab 09/08/23 2109 09/08/23 2342 09/09/23 0326 09/09/23 0720 09/09/23 1149  GLUCAP 132* 159* 160* 118* 131*   Lipid Profile: Recent Labs  09/07/23 0351  TRIG 108   Thyroid  Function Tests: No results for input(s): TSH, T4TOTAL, FREET4, T3FREE, THYROIDAB in the last 72 hours. Anemia Panel: No results for input(s): VITAMINB12, FOLATE, FERRITIN, TIBC, IRON, RETICCTPCT in the last 72 hours.  Urine analysis:    Component Value Date/Time   COLORURINE AMBER (A) 09/03/2023 0543   APPEARANCEUR CLOUDY (A) 09/03/2023 0543   APPEARANCEUR Clear 08/14/2014 0933   LABSPEC 1.026 09/03/2023 0543   LABSPEC 1.015 08/14/2014 0933   PHURINE 5.0 09/03/2023 0543   GLUCOSEU NEGATIVE 09/03/2023 0543   GLUCOSEU Negative 08/14/2014 0933   HGBUR NEGATIVE 09/03/2023 0543   BILIRUBINUR NEGATIVE 09/03/2023 0543   BILIRUBINUR Negative 08/14/2014 0933   KETONESUR NEGATIVE 09/03/2023 0543   PROTEINUR 30 (A) 09/03/2023 0543   UROBILINOGEN 0.2 06/12/2010 0103   NITRITE NEGATIVE 09/03/2023 0543   LEUKOCYTESUR LARGE (A) 09/03/2023 0543   LEUKOCYTESUR Negative 08/14/2014 0933   Sepsis Labs: @LABRCNTIP (procalcitonin:4,lacticidven:4)  ) Recent Results (from the past 240 hours)  Blood Culture (routine x 2)     Status: None   Collection Time: 09/03/23  3:08 AM   Specimen: BLOOD  Result Value Ref Range Status   Specimen  Description BLOOD BLOOD RIGHT ARM  Final   Special Requests   Final    BOTTLES DRAWN AEROBIC AND ANAEROBIC Blood Culture results may not be optimal due to an inadequate volume of blood received in culture bottles   Culture   Final    NO GROWTH 5 DAYS Performed at [facility], [address] [place], [place] [address]    Report Status 09/08/2023 FINAL  Final  Resp panel by RT-PCR (RSV, Flu A&B, Covid) Anterior Nasal Swab     Status: Abnormal   Collection Time: 09/03/23  3:18 AM   Specimen: Anterior Nasal Swab  Result Value Ref Range Status   SARS Coronavirus 2 by RT PCR NEGATIVE NEGATIVE Final    Comment: (NOTE) SARS-CoV-2 target nucleic acids are NOT DETECTED.  The SARS-CoV-2 RNA is generally detectable in upper respiratory specimens during the acute phase of infection. The lowest concentration of SARS-CoV-2 viral copies this assay can detect is 138 copies/mL. A negative result does not preclude SARS-Cov-2 infection and should not be used as the sole basis for treatment or other patient management decisions. A negative result may occur with  improper specimen collection/handling, submission of specimen other than nasopharyngeal swab, presence of viral mutation(s) within the areas targeted by this assay, and inadequate number of viral copies(<138 copies/mL). A negative result must be combined with clinical observations, patient history, and epidemiological information. The expected result is Negative.  Fact Sheet for Patients:  bloggercourse.com  Fact Sheet for Healthcare Providers:  seriousbroker.it  This test is no t yet approved or cleared by the [place]  FDA and  has been authorized for detection and/or diagnosis of SARS-CoV-2 by FDA under an Emergency Use Authorization (EUA). This EUA will remain  in effect (meaning this test can be used) for the duration of the COVID-19 declaration under Section 564(b)(1) of  the Act, 21 U.S.C.section 360bbb-3(b)(1), unless the authorization is terminated  or revoked sooner.       Influenza A by PCR POSITIVE (A) NEGATIVE Final   Influenza B by PCR NEGATIVE NEGATIVE Final    Comment: (NOTE) The Xpert Xpress SARS-CoV-2/FLU/RSV plus assay is intended as an aid in the diagnosis of influenza from Nasopharyngeal swab specimens and should not be used as a sole basis for treatment. Nasal washings and aspirates are  unacceptable for Xpert Xpress SARS-CoV-2/FLU/RSV testing.  Fact Sheet for Patients: bloggercourse.com  Fact Sheet for Healthcare Providers: seriousbroker.it  This test is not yet approved or cleared by the [place]  FDA and has been authorized for detection and/or diagnosis of SARS-CoV-2 by FDA under an Emergency Use Authorization (EUA). This EUA will remain in effect (meaning this test can be used) for the duration of the COVID-19 declaration under Section 564(b)(1) of the Act, 21 U.S.C. section 360bbb-3(b)(1), unless the authorization is terminated or revoked.     Resp Syncytial Virus by PCR NEGATIVE NEGATIVE Final    Comment: (NOTE) Fact Sheet for Patients: bloggercourse.com  Fact Sheet for Healthcare Providers: seriousbroker.it  This test is not yet approved or cleared by the [place]  FDA and has been authorized for detection and/or diagnosis of SARS-CoV-2 by FDA under an Emergency Use Authorization (EUA). This EUA will remain in effect (meaning this test can be used) for the duration of the COVID-19 declaration under Section 564(b)(1) of the Act, 21 U.S.C. section 360bbb-3(b)(1), unless the authorization is terminated or revoked.  Performed at [facility], [address] [place], [place] [address]   Blood Culture (routine x 2)     Status: None   Collection Time: 09/03/23  3:18 AM   Specimen: BLOOD  Result Value  Ref Range Status   Specimen Description BLOOD RIGHT ANTECUBITAL  Final   Special Requests   Final    BOTTLES DRAWN AEROBIC AND ANAEROBIC Blood Culture results may not be optimal due to an inadequate volume of blood received in culture bottles   Culture   Final    NO GROWTH 5 DAYS Performed at [facility], [address] [place], [place] [address]    Report Status 09/08/2023 FINAL  Final  Urine Culture     Status: Abnormal   Collection Time: 09/03/23  5:43 AM   Specimen: Urine, Random  Result Value Ref Range Status   Specimen Description   Final    URINE, RANDOM Performed at [facility], [address] [place], [place] [address]    Special Requests   Final    NONE Reflexed from [id number] Performed at [facility], [address] [place], [place] [address]    Culture 30,000 COLONIES/mL STAPHYLOCOCCUS AUREUS (A)  Final   Report Status 09/05/2023 FINAL  Final   Organism ID, Bacteria STAPHYLOCOCCUS AUREUS (A)  Final      Susceptibility   Staphylococcus aureus - MIC*    CIPROFLOXACIN  >=8 RESISTANT Resistant     GENTAMICIN <=0.5 SENSITIVE Sensitive     NITROFURANTOIN <=16 SENSITIVE Sensitive     OXACILLIN >=4 RESISTANT Resistant     TETRACYCLINE <=1 SENSITIVE Sensitive     VANCOMYCIN  1 SENSITIVE Sensitive     TRIMETH/SULFA >=320 RESISTANT Resistant     RIFAMPIN <=0.5 SENSITIVE Sensitive     Inducible Clindamycin  NEGATIVE Sensitive     LINEZOLID 2 SENSITIVE Sensitive     * 30,000 COLONIES/mL STAPHYLOCOCCUS AUREUS  MRSA Next Gen by PCR, Nasal     Status: Abnormal   Collection Time: 09/03/23 11:00 AM   Specimen: Nasal Mucosa; Nasal Swab  Result Value Ref Range Status   MRSA by PCR Next Gen DETECTED (A) NOT DETECTED Final    Comment: RESULT CALLED TO, READ BACK BY AND VERIFIED WITH: JACOB MOORE @1703  09/03/23 MJU (NOTE) The GeneXpert MRSA Assay (FDA approved for NASAL specimens only), is one component of a comprehensive MRSA colonization  surveillance program. It is  not intended to diagnose MRSA infection nor to guide or monitor treatment for MRSA infections. Test performance is not FDA approved in patients less than 47 years old. Performed at [facility], [address], [place], [place] [address]          Radiology Studies: DG Abd 1 View Result Date: 09/08/2023 CLINICAL DATA:  Nasogastric tube placement EXAM: ABDOMEN - 1 VIEW COMPARISON:  09/07/2023 FINDINGS: Enteric tube with tip at the distal stomach, near the pylorus. Hazy density at the lung bases where there is atelectasis by recent CT. High-density within stool in the upper abdomen, unchanged. IMPRESSION: Enteric tube with tip near the pylorus. Electronically Signed   By: Dorn Roulette M.D.   On: 09/08/2023 04:31   DG Chest Port 1 View Result Date: 09/07/2023 CLINICAL DATA:  Confirm NG tube placement EXAM: PORTABLE CHEST 1 VIEW COMPARISON:  None Available. FINDINGS: NG tube enters the stomach, the tip not visualized on this chest study. Right PICC line in place, unchanged. Cardiomegaly with vascular congestion. Platelike atelectasis at the right lung base. Patchy opacities in the left lower lung, similar to prior study. No pneumothorax. No acute bony abnormality. IMPRESSION: NG tube enters the stomach, the tip not visualized on this chest x-ray. Right base atelectasis. Continued patchy opacity at the left lung base. Cardiomegaly, vascular congestion. Electronically Signed   By: Franky Crease M.D.   On: 09/07/2023 23:33        Scheduled Meds:  baclofen   5 mg Oral BID   budesonide  (PULMICORT ) nebulizer solution  0.25 mg Nebulization BID   Chlorhexidine  Gluconate Cloth  6 each Topical Q0600   guaiFENesin   600 mg Oral Q12H   insulin  aspart  0-9 Units Subcutaneous Q4H   ipratropium-albuterol   3 mL Inhalation BID   lactulose   20 g Oral Daily   lamoTRIgine   50 mg Oral BID   melatonin  10 mg Oral QHS   mometasone -formoterol   2 puff Inhalation BID    mupirocin  ointment  1 Application Nasal BID   pantoprazole  (PROTONIX ) IV  40 mg Intravenous Q12H   polyethylene glycol  17 g Oral BID   predniSONE   40 mg Oral Q breakfast   pregabalin   200 mg Oral QHS   sodium chloride  flush  10-40 mL Intracatheter Q12H   tamsulosin   0.4 mg Oral QPM   thiamine  (VITAMIN B1) injection  100 mg Intravenous Q24H   Continuous Infusions:  levETIRAcetam  1,000 mg (09/09/23 0859)   TPN ADULT (ION) 80 mL/hr at 09/08/23 1856   TPN ADULT (ION)       LOS: 6 days     Devaughn KATHEE Ban, MD [facility]   If 7PM-7AM, please contact night-coverage [email or web address] Password [email or web address] 09/09/2023, 4:16 PM

## 2023-09-10 ENCOUNTER — Inpatient Hospital Stay: Payer: Medicare Other

## 2023-09-10 DIAGNOSIS — K9409 Other complications of colostomy: Secondary | ICD-10-CM | POA: Diagnosis not present

## 2023-09-10 DIAGNOSIS — J9601 Acute respiratory failure with hypoxia: Secondary | ICD-10-CM | POA: Diagnosis not present

## 2023-09-10 DIAGNOSIS — K56609 Unspecified intestinal obstruction, unspecified as to partial versus complete obstruction: Secondary | ICD-10-CM | POA: Diagnosis not present

## 2023-09-10 LAB — COMPREHENSIVE METABOLIC PANEL
ALT: 48 U/L — ABNORMAL HIGH (ref 0–44)
AST: 57 U/L — ABNORMAL HIGH (ref 15–41)
Albumin: 2.7 g/dL — ABNORMAL LOW (ref 3.5–5.0)
Alkaline Phosphatase: 47 U/L (ref 38–126)
Anion gap: 9 (ref 5–15)
BUN: 21 mg/dL (ref 8–23)
CO2: 28 mmol/L (ref 22–32)
Calcium: 8.6 mg/dL — ABNORMAL LOW (ref 8.9–10.3)
Chloride: 104 mmol/L (ref 98–111)
Creatinine, Ser: 0.86 mg/dL (ref 0.61–1.24)
GFR, Estimated: >60 mL/min (ref >60)
Glucose, Bld: 119 mg/dL — ABNORMAL HIGH (ref 70–99)
Potassium: 4 mmol/L (ref 3.5–5.1)
Sodium: 141 mmol/L (ref 135–145)
Total Bilirubin: 0.4 mg/dL (ref 0.0–1.2)
Total Protein: 6.3 g/dL — ABNORMAL LOW (ref 6.5–8.1)

## 2023-09-10 LAB — MAGNESIUM: Magnesium: 1.9 mg/dL (ref 1.7–2.4)

## 2023-09-10 LAB — CBC
HCT: 28 % — ABNORMAL LOW (ref 39.0–52.0)
Hemoglobin: 8.8 g/dL — ABNORMAL LOW (ref 13.0–17.0)
MCH: 29.6 pg (ref 26.0–34.0)
MCHC: 31.4 g/dL (ref 30.0–36.0)
MCV: 94.3 fL (ref 80.0–100.0)
Platelets: 214 10*3/uL (ref 150–400)
RBC: 2.97 MIL/uL — ABNORMAL LOW (ref 4.22–5.81)
RDW: 14.2 % (ref 11.5–15.5)
WBC: 4.6 10*3/uL (ref 4.0–10.5)
nRBC: 1.1 % — ABNORMAL HIGH (ref 0.0–0.2)

## 2023-09-10 LAB — TRIGLYCERIDES: Triglycerides: 103 mg/dL (ref <150)

## 2023-09-10 LAB — GLUCOSE, CAPILLARY
Glucose-Capillary: 103 mg/dL — ABNORMAL HIGH (ref 70–99)
Glucose-Capillary: 107 mg/dL — ABNORMAL HIGH (ref 70–99)
Glucose-Capillary: 118 mg/dL — ABNORMAL HIGH (ref 70–99)
Glucose-Capillary: 81 mg/dL (ref 70–99)
Glucose-Capillary: 85 mg/dL (ref 70–99)
Glucose-Capillary: 92 mg/dL (ref 70–99)

## 2023-09-10 LAB — PHOSPHORUS: Phosphorus: 3.4 mg/dL (ref 2.5–4.6)

## 2023-09-10 MED ORDER — TRAVASOL 10 % IV SOLN
INTRAVENOUS | Status: AC
  Filled 2023-09-10: qty 1056

## 2023-09-10 MED ORDER — SODIUM CHLORIDE 0.9 % IV SOLN
2.0000 g | INTRAVENOUS | Status: AC
  Administered 2023-09-11: 2 g via INTRAVENOUS
  Filled 2023-09-10: qty 2

## 2023-09-10 MED ORDER — LORAZEPAM 2 MG/ML IJ SOLN
1.0000 mg | Freq: Four times a day (QID) | INTRAMUSCULAR | Status: DC | PRN
  Administered 2023-09-13 – 2023-09-21 (×13): 1 mg via INTRAVENOUS
  Filled 2023-09-10 (×14): qty 1

## 2023-09-10 MED ORDER — MAGNESIUM SULFATE IN D5W 1-5 GM/100ML-% IV SOLN
1.0000 g | Freq: Once | INTRAVENOUS | Status: AC
  Administered 2023-09-10: 1 g via INTRAVENOUS
  Filled 2023-09-10: qty 100

## 2023-09-10 MED ORDER — LORAZEPAM 2 MG/ML IJ SOLN
1.0000 mg | Freq: Once | INTRAMUSCULAR | Status: AC
  Administered 2023-09-10: 1 mg via INTRAVENOUS
  Filled 2023-09-10: qty 1

## 2023-09-10 NOTE — Plan of Care (Signed)
  Problem: Health Behavior/Discharge Planning: Goal: Ability to manage health-related needs will improve Outcome: Progressing   Problem: Clinical Measurements: Goal: Will remain free from infection Outcome: Progressing Goal: Diagnostic test results will improve Outcome: Progressing Goal: Cardiovascular complication will be avoided Outcome: Progressing   Problem: Activity: Goal: Risk for activity intolerance will decrease Outcome: Progressing   Problem: Elimination: Goal: Will not experience complications related to urinary retention Outcome: Progressing   Problem: Pain Management: Goal: General experience of comfort will improve Outcome: Progressing   Problem: Safety: Goal: Ability to remain free from injury will improve Outcome: Progressing

## 2023-09-10 NOTE — Progress Notes (Signed)
 PROGRESS NOTE    Calvin [name]  FMW:[id number] DOB: [birth year] DOA: 09/03/2023 PCP: Laurine Gladden, MD     Brief Narrative:   From admission h and p  Calvin [name] is a 73 y.o. 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   Symptoms started yesterday patient started to develop cramping-like abdominal pain, centrally located, associated with nauseous vomiting of stomach content, multiple times denies any blood or bile in the vomitus, no fever or chills.  Overnight patient started to have productive cough with light-colored phlegm no chest pains.   Assessment & Plan:   Principal Problem:   Acute respiratory failure with hypoxia (HCC) Active Problems:   Seizure (HCC)   Ogilvie syndrome   Paroxysmal atrial flutter (HCC)   Chronic diastolic CHF (congestive heart failure) (HCC)   Essential hypertension   CAD (coronary artery disease)   COPD (chronic obstructive pulmonary disease) (HCC)   History of CVA (cerebrovascular accident)   Sepsis (HCC)   Colostomy prolapse (HCC)  # Flu A Slowly improving, s/p tamiflu   # CAP LLL infiltrate vs edema. Aspiration risk, worth covering for that. cefepme/flagyl >unasyn , Abx started 12/30, now s/p 5 days treatment - monitor off abx  # Sepsis, severe By fever, tachycardia, elevated lactic acid. Hemodynamically stable. Lactate normalized - continue fluids while npo  # Acute hypoxic respiratory failure 2/2 flu/cap, currently on 2 liters and stable, satting well - wean [place] o2 as able  # SBO, mechanical History ogilvie syndrome s/p colostomy, here with some vomiting, kub showing signs of obstruction. Gen surg following. Several episodes emesis 1/1, ng tube  placed. - NG tube in place - TPN running - surgery planned for tomorrow with plan for lysis of adhesions  # Delirium Secondary to above processes. Lucid today - mitts to safely continue NG tube  # Anemia Hgb has drifted from baseline of 11-12 to 8s and now relatively stable there though slight decrease today to 7.6, no bleeding. discussed with GI Dr. 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 Worsening - has binder - revision 1/7   # COPD Some wheeze, ill as above. S/p 6 days prednisone  - d/c prednisone   # AKI on ckd 2 Prerenal 2/2 dehydration, resolved w/ fluids - continue IVF while npo  # Lactic acidosis 2/2 above processes, resolved - continue IVF   # Seizure disorder With recent hospitalization for breakthrough seizures - continue keppra , home lamictal  and pregabalin   # hx CVA  With hemiplegia - hold asa given anemia as above  # BPH  - cont home flomax   # HFpEF Dehydrated as above - judicious IV fluids  # Debility Resides at snf - PT consulted, advising SNF  DVT prophylaxis: scds Code Status: full Family Communication: daughter stephanie updated at bedside 1/5. No answer  Level of care: Telemetry Medical Status is: Inpatient Remains inpatient appropriate because: severity of illness    Consultants:  Gen surg  Procedures: pending  Antimicrobials:  See above    Subjective:  Reports improvement in abdominal pain, no other complaints  Objective: Vitals:   09/10/23 0500 09/10/23 0540 09/10/23 0736 09/10/23 1121  BP:  (!) 163/86 (!) 156/88 130/74  Pulse:  73 88 69  Resp:  19 16 16   Temp:  (!) 97.4 F (36.3 C) 98 F (36.7 C)   TempSrc:      SpO2:  98% 94% 96%   Weight: 94.4 kg     Height:        Intake/Output Summary (Last 24 hours) at 09/10/2023 1413 Last data filed at 09/10/2023 0540 Gross per 24 hour  Intake 1580.24 ml  Output 3825 ml  Net -2244.76 ml   Filed Weights   09/08/23 0500 09/09/23 0411 09/10/23 0500  Weight: 96.9 kg 96.6 kg 94.4 kg    Examination:  General exam: chronically ill appearing Respiratory system: normal rate and wob, scattered rhonchi, rales at base Cardiovascular system: S1 & S2 heard, tachycardic Gastrointestinal system: Abdomen is distended, binder in place, greenish fluid from ng tube, diffuse mild tenderness. Ostomy in place small amount brown liquid stool. Central nervous system: confused, Left sided weakness Extremities: warm, trace LE edema Skin: No visible rashes, lesions or ulcers Psychiatry: calm    Data Reviewed: I have personally reviewed following labs and imaging studies  CBC: Recent Labs  Lab 09/06/23 0622 09/07/23 0351 09/08/23 0458 09/09/23 0724 09/10/23 0559  WBC 6.2 4.0 3.0* 3.1* 4.6  HGB 8.8* 8.7* 8.1* 7.6* 8.8*  HCT 28.9* 28.3* 25.9* 24.4* 28.0*  MCV 97.6 94.6 94.2 94.6 94.3  PLT 192 168 160 168 214   Basic Metabolic Panel: Recent Labs  Lab 09/05/23 0427 09/06/23 0622 09/07/23 0351 09/08/23 0458 09/10/23 0559  NA 137 137 140 140 141  K 4.9 4.0 3.5 3.7 4.0  CL 105 103 106 105 104  CO2 23 25 25 25 28   GLUCOSE 109* 108* 88 132* 119*  BUN 17 15 10 11 21   CREATININE 1.25* 1.06 1.03 0.84 0.86  CALCIUM  7.8* 7.7* 7.9* 8.1* 8.6*  MG  --   --  1.7 2.1 1.9  PHOS  --   --  2.5 2.8 3.4   GFR: Estimated Creatinine Clearance: 88.4 mL/min (by C-G formula based on SCr of 0.86 mg/dL). Liver Function Tests: Recent Labs  Lab 09/07/23 0351 09/08/23 0458 09/10/23 0559  AST 15 16 57*  ALT 17 13 48*  ALKPHOS 39 36* 47  BILITOT 0.5 0.3 0.4  PROT 5.8* 5.8* 6.3*  ALBUMIN  2.4* 2.5* 2.7*   No results for input(s): LIPASE, AMYLASE in the last 168 hours. No results for  input(s): AMMONIA in the last 168 hours. Coagulation Profile: No results for input(s): INR, PROTIME in the last 168 hours.  Cardiac Enzymes: No results for input(s): CKTOTAL, CKMB, CKMBINDEX, TROPONINI in the last 168 hours. BNP (last 3 results) No results for input(s): PROBNP in the last 8760 hours. HbA1C: No results for input(s): HGBA1C in the last 72 hours. CBG: Recent Labs  Lab 09/09/23 1637 09/09/23 2132 09/10/23 0137 09/10/23 0406 09/10/23 1335  GLUCAP 123* 145* 118* 107* 92   Lipid Profile: Recent Labs    09/10/23 0559  TRIG 103   Thyroid  Function Tests: No results for input(s): TSH, T4TOTAL, FREET4, T3FREE, THYROIDAB in the last 72 hours. Anemia Panel: No results for input(s): VITAMINB12, FOLATE, FERRITIN, TIBC, IRON, RETICCTPCT in the last 72 hours.  Urine analysis:    Component Value Date/Time   COLORURINE AMBER (A) 09/03/2023 0543   APPEARANCEUR CLOUDY (A) 09/03/2023  0543   APPEARANCEUR Clear 08/14/2014 0933   LABSPEC 1.026 09/03/2023 0543   LABSPEC 1.015 08/14/2014 0933   PHURINE 5.0 09/03/2023 0543   GLUCOSEU NEGATIVE 09/03/2023 0543   GLUCOSEU Negative 08/14/2014 0933   HGBUR NEGATIVE 09/03/2023 0543   BILIRUBINUR NEGATIVE 09/03/2023 0543   BILIRUBINUR Negative 08/14/2014 0933   KETONESUR NEGATIVE 09/03/2023 0543   PROTEINUR 30 (A) 09/03/2023 0543   UROBILINOGEN 0.2 06/12/2010 0103   NITRITE NEGATIVE 09/03/2023 0543   LEUKOCYTESUR LARGE (A) 09/03/2023 0543   LEUKOCYTESUR Negative 08/14/2014 0933   Sepsis Labs: @LABRCNTIP (procalcitonin:4,lacticidven:4)  ) Recent Results (from the past 240 hours)  Blood Culture (routine x 2)     Status: None   Collection Time: 09/03/23  3:08 AM   Specimen: BLOOD  Result Value Ref Range Status   Specimen Description BLOOD BLOOD RIGHT ARM  Final   Special Requests   Final    BOTTLES DRAWN AEROBIC AND ANAEROBIC Blood Culture results may not be optimal due to an inadequate  volume of blood received in culture bottles   Culture   Final    NO GROWTH 5 DAYS Performed at [facility], [address] [place], [place] [address]    Report Status 09/08/2023 FINAL  Final  Resp panel by RT-PCR (RSV, Flu A&B, Covid) Anterior Nasal Swab     Status: Abnormal   Collection Time: 09/03/23  3:18 AM   Specimen: Anterior Nasal Swab  Result Value Ref Range Status   SARS Coronavirus 2 by RT PCR NEGATIVE NEGATIVE Final    Comment: (NOTE) SARS-CoV-2 target nucleic acids are NOT DETECTED.  The SARS-CoV-2 RNA is generally detectable in upper respiratory specimens during the acute phase of infection. The lowest concentration of SARS-CoV-2 viral copies this assay can detect is 138 copies/mL. A negative result does not preclude SARS-Cov-2 infection and should not be used as the sole basis for treatment or other patient management decisions. A negative result may occur with  improper specimen collection/handling, submission of specimen other than nasopharyngeal swab, presence of viral mutation(s) within the areas targeted by this assay, and inadequate number of viral copies(<138 copies/mL). A negative result must be combined with clinical observations, patient history, and epidemiological information. The expected result is Negative.  Fact Sheet for Patients:  bloggercourse.com  Fact Sheet for Healthcare Providers:  seriousbroker.it  This test is no t yet approved or cleared by the [place]  FDA and  has been authorized for detection and/or diagnosis of SARS-CoV-2 by FDA under an Emergency Use Authorization (EUA). This EUA will remain  in effect (meaning this test can be used) for the duration of the COVID-19 declaration under Section 564(b)(1) of the Act, 21 U.S.C.section 360bbb-3(b)(1), unless the authorization is terminated  or revoked sooner.       Influenza A by PCR POSITIVE (A) NEGATIVE Final    Influenza B by PCR NEGATIVE NEGATIVE Final    Comment: (NOTE) The Xpert Xpress SARS-CoV-2/FLU/RSV plus assay is intended as an aid in the diagnosis of influenza from Nasopharyngeal swab specimens and should not be used as a sole basis for treatment. Nasal washings and aspirates are unacceptable for Xpert Xpress SARS-CoV-2/FLU/RSV testing.  Fact Sheet for Patients: bloggercourse.com  Fact Sheet for Healthcare Providers: seriousbroker.it  This test is not yet approved or cleared by the [place]  FDA and has been authorized for detection and/or diagnosis of SARS-CoV-2 by FDA under an Emergency Use Authorization (EUA). This EUA will remain in effect (meaning this test can be used)  for the duration of the COVID-19 declaration under Section 564(b)(1) of the Act, 21 U.S.C. section 360bbb-3(b)(1), unless the authorization is terminated or revoked.     Resp Syncytial Virus by PCR NEGATIVE NEGATIVE Final    Comment: (NOTE) Fact Sheet for Patients: bloggercourse.com  Fact Sheet for Healthcare Providers: seriousbroker.it  This test is not yet approved or cleared by the [place]  FDA and has been authorized for detection and/or diagnosis of SARS-CoV-2 by FDA under an Emergency Use Authorization (EUA). This EUA will remain in effect (meaning this test can be used) for the duration of the COVID-19 declaration under Section 564(b)(1) of the Act, 21 U.S.C. section 360bbb-3(b)(1), unless the authorization is terminated or revoked.  Performed at [facility], [address] [place], [place] [address]   Blood Culture (routine x 2)     Status: None   Collection Time: 09/03/23  3:18 AM   Specimen: BLOOD  Result Value Ref Range Status   Specimen Description BLOOD RIGHT ANTECUBITAL  Final   Special Requests   Final    BOTTLES DRAWN AEROBIC AND ANAEROBIC Blood Culture results  may not be optimal due to an inadequate volume of blood received in culture bottles   Culture   Final    NO GROWTH 5 DAYS Performed at [facility], [address] [place], [place] [address]    Report Status 09/08/2023 FINAL  Final  Urine Culture     Status: Abnormal   Collection Time: 09/03/23  5:43 AM   Specimen: Urine, Random  Result Value Ref Range Status   Specimen Description   Final    URINE, RANDOM Performed at [facility], [address], [place], [place] [address]    Special Requests   Final    NONE Reflexed from [id number] Performed at [facility], [address] [place], [place] [address]    Culture 30,000 COLONIES/mL STAPHYLOCOCCUS AUREUS (A)  Final   Report Status 09/05/2023 FINAL  Final   Organism ID, Bacteria STAPHYLOCOCCUS AUREUS (A)  Final      Susceptibility   Staphylococcus aureus - MIC*    CIPROFLOXACIN  >=8 RESISTANT Resistant     GENTAMICIN <=0.5 SENSITIVE Sensitive     NITROFURANTOIN <=16 SENSITIVE Sensitive     OXACILLIN >=4 RESISTANT Resistant     TETRACYCLINE <=1 SENSITIVE Sensitive     VANCOMYCIN  1 SENSITIVE Sensitive     TRIMETH/SULFA >=320 RESISTANT Resistant     RIFAMPIN <=0.5 SENSITIVE Sensitive     Inducible Clindamycin  NEGATIVE Sensitive     LINEZOLID 2 SENSITIVE Sensitive     * 30,000 COLONIES/mL STAPHYLOCOCCUS AUREUS  MRSA Next Gen by PCR, Nasal     Status: Abnormal   Collection Time: 09/03/23 11:00 AM   Specimen: Nasal Mucosa; Nasal Swab  Result Value Ref Range Status   MRSA by PCR Next Gen DETECTED (A) NOT DETECTED Final    Comment: RESULT CALLED TO, READ BACK BY AND VERIFIED WITH: JACOB MOORE @1703  09/03/23 MJU (NOTE) The GeneXpert MRSA Assay (FDA approved for NASAL specimens only), is one component of a comprehensive MRSA colonization surveillance program. It is not intended to diagnose MRSA infection nor to guide or monitor treatment for MRSA infections. Test performance is not FDA approved in patients  less than 81 years old. Performed at [facility], [address], [place], [place] [address]          Radiology Studies: DG Abd 2 Views Result Date: 09/10/2023 CLINICAL DATA:  Abdominal pain.  Small-bowel obstruction. EXAM: ABDOMEN - 2 VIEW COMPARISON:  09/08/2023 and older exams.  CT, 09/06/2023. FINDINGS: There is dilated proximal small bowel projecting in the central abdomen, similar to the prior exams consistent with a partial small bowel obstruction. No free air. Nasogastric tube well positioned, tip in the distal stomach. Small amount of residual contrast noted in a mostly decompressed left colon. IMPRESSION: 1. Persistent partial small bowel obstruction. Electronically Signed   By: Alm Parkins M.D.   On: 09/10/2023 09:07        Scheduled Meds:  baclofen   5 mg Oral BID   budesonide  (PULMICORT ) nebulizer solution  0.25 mg Nebulization BID   Chlorhexidine  Gluconate Cloth  6 each Topical Q0600   guaiFENesin   600 mg Oral Q12H   insulin  aspart  0-9 Units Subcutaneous Q4H   ipratropium-albuterol   3 mL Inhalation BID   lactulose   20 g Oral Daily   lamoTRIgine   50 mg Oral BID   melatonin  10 mg Oral QHS   mometasone -formoterol   2 puff Inhalation BID   mupirocin  ointment  1 Application Nasal BID   pantoprazole  (PROTONIX ) IV  40 mg Intravenous Q12H   polyethylene glycol  17 g Oral BID   pregabalin   200 mg Oral QHS   sodium chloride  flush  10-40 mL Intracatheter Q12H   tamsulosin   0.4 mg Oral QPM   thiamine  (VITAMIN B1) injection  100 mg Intravenous Q24H   Continuous Infusions:  [START ON 09/11/2023] cefoTEtan  (CEFOTAN ) IV     levETIRAcetam  1,000 mg (09/10/23 0804)   TPN ADULT (ION) 80 mL/hr at 09/10/23 0501   TPN ADULT (ION)       LOS: 7 days     Devaughn KATHEE Ban, MD [facility]   If 7PM-7AM, please contact night-coverage [email or web address] Password [email or web address] 09/10/2023, 2:13 PM

## 2023-09-10 NOTE — Progress Notes (Signed)
 SLP Cancellation Note  Patient Details Name: Calvin [name] MRN: [id number] DOB: [birth year]   Cancelled treatment:       Reason Eval/Treat Not Completed: Medical issues which prohibited therapy Pt remains NPO secondary to medical team recommendations. Given extended medical restrictions, SLP to sign off. Please re-consult when pt is ready for return to PO.   Calvin Holliman Clapp  MS [facility] SLP   Calvin [name] 09/10/2023, 8:33 AM

## 2023-09-10 NOTE — Progress Notes (Signed)
 [facility] SURGICAL PROGRESS NOTE (cpt [phone])  Hospital Day(s): 7.   Interval History: Patient seen and examined. Nothing acute overnight reportedly. This morning, he is complaining of abdominal pain; difficulty localizing this. Ostomy is not prolapsed. No fever, chills, nausea. He continues without leukocytosis; WBC 4.6K. Hgb to 8.8; improved. Renal function normalized; sCr - 0.86/BUN 21; UO - 3600 ccs. No electrolyte derangements. NGT in place; output 700 ccs. He is having ostomy output; 225 ccs recorded. KUB this morning continues to show small bowel dilation.    Review of Systems:  Constitutional: denies fever, chills  HEENT: denies cough or congestion  Respiratory: denies any shortness of breath  Cardiovascular: denies chest pain or palpitations  Gastrointestinal: + abdominal pain, denied N/V Genitourinary: denies burning with urination or urinary frequency Musculoskeletal: denies pain, decreased motor or sensation  Vital signs in last 24 hours: [min-max] current  Temp:  [97.4 F (36.3 C)-98.8 F (37.1 C)] 97.4 F (36.3 C) (01/06 0540) Pulse Rate:  [59-73] 73 (01/06 0540) Resp:  [16-20] 19 (01/06 0540) BP: (147-171)/(68-86) 163/86 (01/06 0540) SpO2:  [96 %-100 %] 98 % (01/06 0540) Weight:  [94.4 kg] 94.4 kg (01/06 0500)     Height: 5' 11 (180.3 cm) Weight: 94.4 kg BMI (Calculated): 29.04   Intake/Output last 2 shifts:  01/05 0701 - 01/06 0700 In: 2200.2 [I.V.:1760.2; NG/GT:240; IV Piggyback:200] Out: 4525 [Urine:3600; Emesis/NG output:700; Stool:225]   Physical Exam:  Constitutional: alert, cooperative and no distress  HENT: normocephalic without obvious abnormality; NGT in place Eyes: PERRL, EOM's grossly intact and symmetric  Respiratory: breathing non-labored at rest; undergoing breathing treatment  Cardiovascular: regular rate and sinus rhythm  Gastrointestinal: soft, non-tender, and non-distended. Colostomy without prolapse this AM; liquid stool in  bag. Abdominal binder in place Genitourinary: Foley in place; good UO Musculoskeletal: no edema or wounds, motor and sensation grossly intact, NT    Labs:     Latest Ref Rng & Units 09/10/2023    5:59 AM 09/09/2023    7:24 AM 09/08/2023    4:58 AM  CBC  WBC 4.0 - 10.5 K/uL 4.6  3.1  3.0   Hemoglobin 13.0 - 17.0 g/dL 8.8  7.6  8.1   Hematocrit 39.0 - 52.0 % 28.0  24.4  25.9   Platelets 150 - 400 K/uL 214  168  160       Latest Ref Rng & Units 09/10/2023    5:59 AM 09/08/2023    4:58 AM 09/07/2023    3:51 AM  CMP  Glucose 70 - 99 mg/dL 880  867  88   BUN 8 - 23 mg/dL 21  11  10    Creatinine 0.61 - 1.24 mg/dL 9.13  9.15  8.96   Sodium 135 - 145 mmol/L 141  140  140   Potassium 3.5 - 5.1 mmol/L 4.0  3.7  3.5   Chloride 98 - 111 mmol/L 104  105  106   CO2 22 - 32 mmol/L 28  25  25    Calcium  8.9 - 10.3 mg/dL 8.6  8.1  7.9   Total Protein 6.5 - 8.1 g/dL 6.3  5.8  5.8   Total Bilirubin 0.0 - 1.2 mg/dL 0.4  0.3  0.5   Alkaline Phos 38 - 126 U/L 47  36  39   AST 15 - 41 U/L 57  16  15   ALT 0 - 44 U/L 48  13  17     Imaging studies:   KUB (  09/09/2022) personally reviewed which shows continued small bowel dilation, there is air in right colon, and radiologist report pending...   Assessment/Plan: (ICD-10's: K42.609) 73 y.o. male with recurrent ileus vs partial SBO, also now likely with parastomal hernia, admitted with suspected HCAP, complicated by pertinent comorbidities including deconditioning, malnutrition, Hx of CVA   - Continue with plan for lysis of adhesions and ostomy revision tomorrow (01/07) with Dr Desiderio  - For now, continue NPO. Okay for a few ice chips for comfort - Continue TPN; monitor electrolytes   - Continue NGT decompression; LIS; monitor and record output   - Monitor abdominal examination - Monitor colostomy output; record - Continue abdominal binder - Pain control prn; antiemetics prn - Further management per primary service; we will follow    All of the above  findings and recommendations were discussed with the patient, and the medical team, and all of patient's questions were answered to his expressed satisfaction.  -- Arthea Platt, PA-C [facility] 09/10/2023, 7:21 AM M-F: 7am - 4pm

## 2023-09-10 NOTE — Progress Notes (Signed)
 PHARMACY - TOTAL PARENTERAL NUTRITION CONSULT NOTE   Indication: Small bowel obstruction  Patient Measurements: Height: 5' 11 (180.3 cm) Weight: 94.4 kg (208 lb 1.8 oz) IBW/kg (Calculated) : 75.3 TPN AdjBW (KG): 93.1 Body mass index is 29.03 kg/m. Usual Weight: 93.1 kg  Assessment:  73 y/o male with PMH significant for seizure disorder, HFmrEF, CKD stage II, HTN, CVA, and colon cancer s/p transverse colectomy and colostomy. Patient presenting with abdominal pain and found to have small bowel obstruction. Pharmacy has been consulted to initiate TPN.  Glucose / Insulin : BG 107-145, SSI q4hours ordered Insulin  needs last 24h: 3 units Electrolytes: K 4.0 Mg 1.9  Renal: Scr 0.86 (BL ~1.0-1.1) Hepatic: AST 16>57 ALT 13>48 Tbili 0.4 Triglycerides: 103 stable Intake / Output; MIVF: none GI Imaging: 01/02 CT Abd:  Recurrent mid small bowel obstruction without clear demonstrated etiology, likely due to adhesions. Ill-defined transition point noted in the right mid abdomen GI Surgeries / Procedures:  Surgery to manage conservatively for now but will tentatively plan for exploration, lysis of adhesions, and ostomy revision early next week.  Central access: PICC TPN start date: 09/07/2023  Nutritional Goals: Due to unforeseen natural disaster and resultant supply constraints, TPN may be customized to target nutritional needs based on available premix products.  Goal TPN rate is 90 mL/hr (Which would provide 118 g of protein and 2224 kcals per day)  RD Assessment: Estimated Needs Total Energy Estimated Needs: 2150-2350 Total Protein Estimated Needs: 105-120 grams Total Fluid Estimated Needs: 2-2.2 L  Current Nutrition:  NPO  Plan:   -plan for OR tomorrow 09/11/23 for exlap, lysis of adhesions, and colostomy revision  Continue TPN at goal rate of 80 mL/hr at 1800 (will provide 1992 kcals) Electrolytes in TPN: Na 68mEq/L, K 50mEq/L, Ca 5 mEq/L, Mg 37mEq/L, and Phos 15mmol/L. Cl:Ac  1:1 -Mag 1.9   Will order Magnesium  sulfate 1 gram IV x 1 -Add standard MVI and trace elements to TPN -Continue Sensitive q4h SSI and adjust as needed  -SSI per 24 hrs: only 3 units and looks like prednisone  d/c- consider change to q6h?-reassess post surgery Monitor TPN labs daily until stable, then on Mon/Thurs  Thank you for involving pharmacy in this patient's care.   Suzann Allean LABOR, PharmD Clinical Pharmacist 09/10/2023 9:19 AM

## 2023-09-10 NOTE — Progress Notes (Signed)
 PT Cancellation Note  Patient Details Name: Calvin [name] MRN: [id number] DOB: [birth year]   Cancelled Treatment:    Reason Eval/Treat Not Completed: Pain limiting ability to participate;Fatigue/lethargy limiting ability to participate. Chart reviewed, RN consulted. Pt engaged at bedside, difficult to understand due to hypophonic dysarthria. Pt encouraged to work on bed-level leg exercises for joint nutrition and preservation of strength. Pt declines multiple times, gestures toward his stoma. Pt reminded that it will be important to return to mobility interventions in the postop phases once the surgical team deems it apropos. Will sign off at this time and await new PT order once pt is appropriate in the postop phase.   1:14 PM, 09/10/23 Calvin [name], PT, DPT Physical Therapist - [facility]  [phone] (ASCOM)    Calvin [name] 09/10/2023, 1:12 PM

## 2023-09-10 NOTE — Progress Notes (Signed)
 Patient exhibits increase agitation throughout the day.  Patient removed own NG tube, Dr Ashok Pall notified with new medication ordered.  If vomiting develops, NG will be replaced per Dr Ashok Pall recommendation.

## 2023-09-11 ENCOUNTER — Inpatient Hospital Stay: Payer: Medicare Other | Admitting: Anesthesiology

## 2023-09-11 ENCOUNTER — Inpatient Hospital Stay: Payer: Medicare Other

## 2023-09-11 ENCOUNTER — Other Ambulatory Visit: Payer: Self-pay

## 2023-09-11 ENCOUNTER — Encounter: Admission: EM | Disposition: E | Payer: Self-pay | Source: Home / Self Care | Attending: Obstetrics and Gynecology

## 2023-09-11 DIAGNOSIS — J9601 Acute respiratory failure with hypoxia: Secondary | ICD-10-CM | POA: Diagnosis not present

## 2023-09-11 DIAGNOSIS — K9409 Other complications of colostomy: Secondary | ICD-10-CM | POA: Diagnosis not present

## 2023-09-11 DIAGNOSIS — K565 Intestinal adhesions [bands], unspecified as to partial versus complete obstruction: Secondary | ICD-10-CM

## 2023-09-11 HISTORY — PX: LAPAROTOMY: SHX154

## 2023-09-11 HISTORY — PX: LYSIS OF ADHESION: SHX5961

## 2023-09-11 HISTORY — PX: COLOSTOMY REVISION: SHX5232

## 2023-09-11 LAB — COMPREHENSIVE METABOLIC PANEL
ALT: 58 U/L — ABNORMAL HIGH (ref 0–44)
AST: 33 U/L (ref 15–41)
Albumin: 2.5 g/dL — ABNORMAL LOW (ref 3.5–5.0)
Alkaline Phosphatase: 51 U/L (ref 38–126)
Anion gap: 11 (ref 5–15)
BUN: 19 mg/dL (ref 8–23)
CO2: 26 mmol/L (ref 22–32)
Calcium: 8.4 mg/dL — ABNORMAL LOW (ref 8.9–10.3)
Chloride: 103 mmol/L (ref 98–111)
Creatinine, Ser: 0.92 mg/dL (ref 0.61–1.24)
GFR, Estimated: >60 mL/min (ref >60)
Glucose, Bld: 108 mg/dL — ABNORMAL HIGH (ref 70–99)
Potassium: 4.1 mmol/L (ref 3.5–5.1)
Sodium: 140 mmol/L (ref 135–145)
Total Bilirubin: 0.6 mg/dL (ref 0.0–1.2)
Total Protein: 5.6 g/dL — ABNORMAL LOW (ref 6.5–8.1)

## 2023-09-11 LAB — TYPE AND SCREEN
ABO/RH(D): B POS
Antibody Screen: NEGATIVE

## 2023-09-11 LAB — CBC
HCT: 27 % — ABNORMAL LOW (ref 39.0–52.0)
Hemoglobin: 8.4 g/dL — ABNORMAL LOW (ref 13.0–17.0)
MCH: 30.2 pg (ref 26.0–34.0)
MCHC: 31.1 g/dL (ref 30.0–36.0)
MCV: 97.1 fL (ref 80.0–100.0)
Platelets: 193 10*3/uL (ref 150–400)
RBC: 2.78 MIL/uL — ABNORMAL LOW (ref 4.22–5.81)
RDW: 14.4 % (ref 11.5–15.5)
WBC: 4.1 10*3/uL (ref 4.0–10.5)
nRBC: 0.7 % — ABNORMAL HIGH (ref 0.0–0.2)

## 2023-09-11 LAB — GLUCOSE, CAPILLARY
Glucose-Capillary: 106 mg/dL — ABNORMAL HIGH (ref 70–99)
Glucose-Capillary: 113 mg/dL — ABNORMAL HIGH (ref 70–99)
Glucose-Capillary: 147 mg/dL — ABNORMAL HIGH (ref 70–99)
Glucose-Capillary: 153 mg/dL — ABNORMAL HIGH (ref 70–99)
Glucose-Capillary: 160 mg/dL — ABNORMAL HIGH (ref 70–99)
Glucose-Capillary: 98 mg/dL (ref 70–99)

## 2023-09-11 LAB — MAGNESIUM: Magnesium: 1.9 mg/dL (ref 1.7–2.4)

## 2023-09-11 LAB — PHOSPHORUS: Phosphorus: 5.2 mg/dL — ABNORMAL HIGH (ref 2.5–4.6)

## 2023-09-11 SURGERY — LAPAROTOMY, EXPLORATORY
Anesthesia: General | Site: Abdomen

## 2023-09-11 MED ORDER — OXYCODONE HCL 5 MG PO TABS: 5.0000 mg | ORAL_TABLET | Freq: Once | ORAL | Status: DC | PRN

## 2023-09-11 MED ORDER — OXYCODONE HCL 5 MG/5ML PO SOLN: 5.0000 mg | Freq: Once | ORAL | Status: DC | PRN

## 2023-09-11 MED ORDER — ALBUMIN HUMAN 5 % IV SOLN
INTRAVENOUS | Status: DC | PRN
Start: 2023-09-11 — End: 2023-09-11

## 2023-09-11 MED ORDER — HYDROMORPHONE HCL 1 MG/ML IJ SOLN
0.2500 mg | INTRAMUSCULAR | Status: DC | PRN
  Administered 2023-09-11: 0.5 mg via INTRAVENOUS
  Administered 2023-09-11 (×2): 0.25 mg via INTRAVENOUS

## 2023-09-11 MED ORDER — PHENYLEPHRINE HCL-NACL 20-0.9 MG/250ML-% IV SOLN
INTRAVENOUS | Status: DC | PRN
  Administered 2023-09-11: 30 ug/min via INTRAVENOUS

## 2023-09-11 MED ORDER — SUGAMMADEX SODIUM 200 MG/2ML IV SOLN
INTRAVENOUS | Status: DC | PRN
  Administered 2023-09-11: 200 mg via INTRAVENOUS

## 2023-09-11 MED ORDER — KETAMINE HCL 50 MG/5ML IJ SOSY
PREFILLED_SYRINGE | INTRAMUSCULAR | Status: DC | PRN
  Administered 2023-09-11: 10 mg via INTRAVENOUS
  Administered 2023-09-11 (×2): 20 mg via INTRAVENOUS

## 2023-09-11 MED ORDER — LACTATED RINGERS IV SOLN
INTRAVENOUS | Status: DC | PRN
Start: 2023-09-11 — End: 2023-09-11

## 2023-09-11 MED ORDER — SILVER NITRATE-POT NITRATE 75-25 % EX MISC
CUTANEOUS | Status: AC
  Filled 2023-09-11: qty 10

## 2023-09-11 MED ORDER — ONDANSETRON HCL 4 MG/2ML IJ SOLN
INTRAMUSCULAR | Status: DC | PRN
  Administered 2023-09-11: 4 mg via INTRAVENOUS

## 2023-09-11 MED ORDER — DEXAMETHASONE SODIUM PHOSPHATE 10 MG/ML IJ SOLN
INTRAMUSCULAR | Status: AC
  Filled 2023-09-11: qty 1

## 2023-09-11 MED ORDER — DEXAMETHASONE SODIUM PHOSPHATE 10 MG/ML IJ SOLN
INTRAMUSCULAR | Status: DC | PRN
  Administered 2023-09-11: 10 mg via INTRAVENOUS

## 2023-09-11 MED ORDER — CHLORHEXIDINE GLUCONATE CLOTH 2 % EX PADS
6.0000 | MEDICATED_PAD | Freq: Every day | CUTANEOUS | Status: AC
  Administered 2023-09-12 – 2023-09-16 (×5): 6 via TOPICAL

## 2023-09-11 MED ORDER — SUCCINYLCHOLINE CHLORIDE 200 MG/10ML IV SOSY
PREFILLED_SYRINGE | INTRAVENOUS | Status: DC | PRN
  Administered 2023-09-11: 100 mg via INTRAVENOUS

## 2023-09-11 MED ORDER — SODIUM CHLORIDE 0.9 % IV SOLN: INTRAVENOUS | Status: DC | PRN

## 2023-09-11 MED ORDER — HYDROMORPHONE HCL 1 MG/ML IJ SOLN
INTRAMUSCULAR | Status: DC | PRN
  Administered 2023-09-11 (×2): .2 mg via INTRAVENOUS

## 2023-09-11 MED ORDER — TRAVASOL 10 % IV SOLN
INTRAVENOUS | Status: AC
  Filled 2023-09-11: qty 1056

## 2023-09-11 MED ORDER — PROPOFOL 10 MG/ML IV BOLUS
INTRAVENOUS | Status: DC | PRN
  Administered 2023-09-11: 110 mg via INTRAVENOUS

## 2023-09-11 MED ORDER — KETAMINE HCL 50 MG/5ML IJ SOSY
PREFILLED_SYRINGE | INTRAMUSCULAR | Status: AC
  Filled 2023-09-11: qty 5

## 2023-09-11 MED ORDER — MAGNESIUM SULFATE 2 GM/50ML IV SOLN: 2.0000 g | Freq: Once | INTRAVENOUS | Status: DC

## 2023-09-11 MED ORDER — PHENYLEPHRINE 80 MCG/ML (10ML) SYRINGE FOR IV PUSH (FOR BLOOD PRESSURE SUPPORT)
PREFILLED_SYRINGE | INTRAVENOUS | Status: DC | PRN
  Administered 2023-09-11 (×2): 80 ug via INTRAVENOUS
  Administered 2023-09-11: 160 ug via INTRAVENOUS
  Administered 2023-09-11: 80 ug via INTRAVENOUS

## 2023-09-11 MED ORDER — FENTANYL CITRATE (PF) 100 MCG/2ML IJ SOLN
INTRAMUSCULAR | Status: AC
  Filled 2023-09-11: qty 2

## 2023-09-11 MED ORDER — ALBUTEROL SULFATE HFA 108 (90 BASE) MCG/ACT IN AERS
INHALATION_SPRAY | RESPIRATORY_TRACT | Status: DC | PRN
  Administered 2023-09-11 (×2): 6 via RESPIRATORY_TRACT

## 2023-09-11 MED ORDER — BUPIVACAINE LIPOSOME 1.3 % IJ SUSP
INTRAMUSCULAR | Status: AC
  Filled 2023-09-11: qty 20

## 2023-09-11 MED ORDER — PHENYLEPHRINE HCL-NACL 20-0.9 MG/250ML-% IV SOLN
INTRAVENOUS | Status: AC
  Filled 2023-09-11: qty 250

## 2023-09-11 MED ORDER — ONDANSETRON HCL 4 MG/2ML IJ SOLN
INTRAMUSCULAR | Status: AC
Start: 2023-09-11 — End: ?
  Filled 2023-09-11: qty 2

## 2023-09-11 MED ORDER — ACETAMINOPHEN 10 MG/ML IV SOLN
INTRAVENOUS | Status: DC | PRN
  Administered 2023-09-11: 1000 mg via INTRAVENOUS

## 2023-09-11 MED ORDER — HYDROMORPHONE HCL 1 MG/ML IJ SOLN
INTRAMUSCULAR | Status: AC
  Filled 2023-09-11: qty 1

## 2023-09-11 MED ORDER — ACETAMINOPHEN 10 MG/ML IV SOLN
1000.0000 mg | Freq: Four times a day (QID) | INTRAVENOUS | Status: AC
  Administered 2023-09-11 – 2023-09-12 (×4): 1000 mg via INTRAVENOUS
  Filled 2023-09-11 (×4): qty 100

## 2023-09-11 MED ORDER — KETOROLAC TROMETHAMINE 30 MG/ML IJ SOLN
15.0000 mg | Freq: Four times a day (QID) | INTRAMUSCULAR | Status: DC
  Administered 2023-09-11 – 2023-09-14 (×11): 15 mg via INTRAVENOUS
  Filled 2023-09-11 (×11): qty 1

## 2023-09-11 MED ORDER — SODIUM CHLORIDE FLUSH 0.9 % IV SOLN
INTRAVENOUS | Status: AC
  Filled 2023-09-11: qty 30

## 2023-09-11 MED ORDER — MIDAZOLAM HCL 2 MG/2ML IJ SOLN
INTRAMUSCULAR | Status: DC | PRN
  Administered 2023-09-11: 1 mg via INTRAVENOUS

## 2023-09-11 MED ORDER — ALBUTEROL SULFATE HFA 108 (90 BASE) MCG/ACT IN AERS
INHALATION_SPRAY | RESPIRATORY_TRACT | Status: AC
  Filled 2023-09-11: qty 6.7

## 2023-09-11 MED ORDER — ACETAMINOPHEN 10 MG/ML IV SOLN
INTRAVENOUS | Status: AC
  Filled 2023-09-11: qty 100

## 2023-09-11 MED ORDER — HYDROMORPHONE HCL 1 MG/ML IJ SOLN
0.5000 mg | INTRAMUSCULAR | Status: DC | PRN
  Administered 2023-09-12 – 2023-09-13 (×4): 0.5 mg via INTRAVENOUS
  Filled 2023-09-11 (×4): qty 0.5

## 2023-09-11 MED ORDER — MIDAZOLAM HCL 2 MG/2ML IJ SOLN
INTRAMUSCULAR | Status: AC
Start: 2023-09-11 — End: ?
  Filled 2023-09-11: qty 2

## 2023-09-11 MED ORDER — BUPIVACAINE-EPINEPHRINE (PF) 0.5% -1:200000 IJ SOLN
INTRAMUSCULAR | Status: DC | PRN
  Administered 2023-09-11: 30 mL

## 2023-09-11 MED ORDER — FENTANYL CITRATE (PF) 100 MCG/2ML IJ SOLN
INTRAMUSCULAR | Status: DC | PRN
  Administered 2023-09-11 (×2): 50 ug via INTRAVENOUS

## 2023-09-11 MED ORDER — LIDOCAINE HCL (CARDIAC) PF 100 MG/5ML IV SOSY
PREFILLED_SYRINGE | INTRAVENOUS | Status: DC | PRN
  Administered 2023-09-11: 60 mg via INTRAVENOUS

## 2023-09-11 MED ORDER — SODIUM CHLORIDE 0.9 % IV SOLN
INTRAVENOUS | Status: DC | PRN
  Administered 2023-09-11: 50 mL

## 2023-09-11 MED ORDER — ROCURONIUM BROMIDE 100 MG/10ML IV SOLN
INTRAVENOUS | Status: DC | PRN
  Administered 2023-09-11: 30 mg via INTRAVENOUS
  Administered 2023-09-11: 20 mg via INTRAVENOUS

## 2023-09-11 MED ORDER — SODIUM CHLORIDE 0.9 % IV SOLN
2.0000 g | Freq: Three times a day (TID) | INTRAVENOUS | Status: AC
  Administered 2023-09-11 – 2023-09-12 (×3): 2 g via INTRAVENOUS
  Filled 2023-09-11 (×3): qty 2

## 2023-09-11 SURGICAL SUPPLY — 47 items
ADHESIVE MASTISOL STRL (MISCELLANEOUS) IMPLANT
CHLORAPREP W/TINT 26 (MISCELLANEOUS) ×1 IMPLANT
DRAPE LAPAROTOMY 100X77 ABD (DRAPES) ×1 IMPLANT
DRSG OPSITE POSTOP 4X12 (GAUZE/BANDAGES/DRESSINGS) IMPLANT
DRSG OPSITE POSTOP 4X8 (GAUZE/BANDAGES/DRESSINGS) IMPLANT
DRSG TEGADERM 4X10 (GAUZE/BANDAGES/DRESSINGS) IMPLANT
ELECT CAUTERY BLADE TIP 2.5 (TIP) ×1
ELECT EZSTD 165MM 6.5IN (MISCELLANEOUS) ×1
ELECT REM PT RETURN 9FT ADLT (ELECTROSURGICAL) ×1
ELECTRODE CAUTERY BLDE TIP 2.5 (TIP) ×1 IMPLANT
ELECTRODE EZSTD 165MM 6.5IN (MISCELLANEOUS) ×1 IMPLANT
ELECTRODE REM PT RTRN 9FT ADLT (ELECTROSURGICAL) ×1 IMPLANT
GAUZE 4X4 16PLY ~~LOC~~+RFID DBL (SPONGE) ×1 IMPLANT
GAUZE SPONGE 4X4 12PLY STRL (GAUZE/BANDAGES/DRESSINGS) ×1 IMPLANT
GLOVE SURG SYN 7.0 (GLOVE) ×3
GLOVE SURG SYN 7.0 PF PI (GLOVE) ×2 IMPLANT
GLOVE SURG SYN 7.5 E (GLOVE) ×3
GLOVE SURG SYN 7.5 PF PI (GLOVE) ×2 IMPLANT
GOWN STRL REUS W/ TWL LRG LVL3 (GOWN DISPOSABLE) ×4 IMPLANT
KIT OSTOMY DRAINABLE 2.75 STR (WOUND CARE) IMPLANT
LABEL OR SOLS (LABEL) ×1 IMPLANT
LIGASURE IMPACT 36 18CM CVD LR (INSTRUMENTS) IMPLANT
MANIFOLD NEPTUNE II (INSTRUMENTS) ×1 IMPLANT
NDL HYPO 22X1.5 SAFETY MO (MISCELLANEOUS) ×1 IMPLANT
NEEDLE HYPO 22X1.5 SAFETY MO (MISCELLANEOUS) ×1
NS IRRIG 1000ML POUR BTL (IV SOLUTION) ×1 IMPLANT
NS IRRIG 500ML POUR BTL (IV SOLUTION) IMPLANT
PACK BASIN MAJOR ARMC (MISCELLANEOUS) ×1 IMPLANT
PACK COLON CLEAN CLOSURE (MISCELLANEOUS) ×1 IMPLANT
RELOAD PROXIMATE 75MM BLUE (ENDOMECHANICALS) ×2
RELOAD STAPLE 75 3.8 BLU REG (ENDOMECHANICALS) IMPLANT
SEPRAFILM MEMBRANE 5X6 (MISCELLANEOUS) IMPLANT
SPONGE T-LAP 18X18 ~~LOC~~+RFID (SPONGE) ×3 IMPLANT
STAPLER PROXIMATE 75MM BLUE (STAPLE) IMPLANT
STAPLER SKIN PROX 35W (STAPLE) ×1 IMPLANT
SUT PDS AB 1 CT1 36 (SUTURE) ×1 IMPLANT
SUT PROLENE 2 0 SH DA (SUTURE) IMPLANT
SUT SILK 2-0 18XBRD TIE 12 (SUTURE) ×1 IMPLANT
SUT SILK 3 0 SH CR/8 (SUTURE) ×1 IMPLANT
SUT VIC AB 2-0 SH 27XBRD (SUTURE) IMPLANT
SUT VIC AB 3-0 SH 27X BRD (SUTURE) ×1 IMPLANT
SUT VICRYL 3-0 CR8 SH (SUTURE) IMPLANT
SYR 10ML LL (SYRINGE) ×1 IMPLANT
SYR 20ML LL LF (SYRINGE) IMPLANT
TRAP FLUID SMOKE EVACUATOR (MISCELLANEOUS) ×1 IMPLANT
TRAY FOLEY MTR SLVR 16FR STAT (SET/KITS/TRAYS/PACK) ×1 IMPLANT
WATER STERILE IRR 500ML POUR (IV SOLUTION) ×1 IMPLANT

## 2023-09-11 NOTE — Anesthesia Preprocedure Evaluation (Signed)
 Anesthesia Evaluation  Patient identified by MRN, date of birth, ID band Patient awake    Reviewed: Allergy & Precautions, NPO status , Patient's Chart, lab work & pertinent test results  History of Anesthesia Complications Negative for: history of anesthetic complications  Airway Mallampati: II  TM Distance: >3 FB Neck ROM: full    Dental  (+) Poor Dentition   Pulmonary pneumonia (s/p 5 days abx, no longer requiring supplemental oxygen), COPD,  COPD inhaler, former smoker   Pulmonary exam normal        Cardiovascular hypertension, On Medications + CAD, + Past MI and +CHF  Normal cardiovascular exam+ dysrhythmias   Echo 04/12/23 INTERPRETATION  LOW-NORMAL LEFT VENTRICULAR SYSTOLIC FUNCTION WITH AN ESTIMATED EF = 45-50 %  NORMAL RIGHT VENTRICULAR SYSTOLIC FUNCTION  TRIVIAL REGURGITATION NOTED (See above)  NO VALVULAR STENOSIS  MODERATELY DILATED AORTIC ROOT MEASURING 4.4 CM   EKG 09/07/23 Sinus rhythm with 1st degree A-V block Low voltage QRS Inferior infarct (cited on or before 07-Sep-2023) Possible Anterolateral infarct (cited on or before 07-Sep-2023) Abnormal ECG When compared with ECG of 07-Sep-2023 12:45, (unconfirmed) Premature supraventricular complexes are no longer Present    Neuro/Psych  Headaches, Seizures -, Poorly Controlled,  PSYCHIATRIC DISORDERS Anxiety Depression     Neuromuscular disease CVA (L hemiparesis), Residual Symptoms    GI/Hepatic negative GI ROS, Neg liver ROS,,,Dysphagia, SBO   Endo/Other  negative endocrine ROS    Renal/GU CRF and ARFRenal disease     Musculoskeletal   Abdominal   Peds  Hematology  (+) Blood dyscrasia, anemia   Anesthesia Other Findings Past Medical History: No date: Alcohol  abuse     Comment:  drinks on weekend No date: Anemia No date: Anxiety No date: Arthritis No date: Cancer (HCC)     Comment:  colon,throat No date: COPD (chronic obstructive pulmonary  disease) (HCC) No date: Coronary artery disease No date: Depression No date: Gout No date: Hemiplegia and hemiparesis following cerebral infarction  affecting left non-dominant side (HCC) No date: Hypertension No date: Myocardial infarction (HCC) No date: 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 No date: Seizures (HCC)     Comment:  last 6 months ago No date: Stroke [facility])     Comment:  multiple  left side weakness No date: Tremors of nervous system No date: Unstable angina (HCC)  Past Surgical History: 10/19/2015: CARPAL TUNNEL RELEASE; Left     Comment:  Procedure: CARPAL TUNNEL RELEASE;  Surgeon: Ozell Flake, MD;  Location: [facility] ORS;  Service: Orthopedics;                Laterality: Left; 10/26/2021: COLONOSCOPY WITH PROPOFOL ; N/A     Comment:  Procedure: COLONOSCOPY WITH PROPOFOL ;  Surgeon:               Maryruth Ole DASEN, MD;  Location: [facility] END[facility]COPY;                Service: Endoscopy;  Laterality: N/A; 06/02/2022: COLONOSCOPY WITH PROPOFOL ; N/A     Comment:  Procedure: COLONOSCOPY WITH PROPOFOL ;  Surgeon:               Maryruth Ole DASEN, MD;  Location: [facility] ENDOSCOPY;                Service: Endoscopy;  Laterality: N/A; 06/06/2022: COLONOSCOPY WITH PROPOFOL ; N/A     Comment:  Procedure: COLONOSCOPY WITH PROPOFOL ;  Surgeon:  Maryruth Ole DASEN, MD;  Location: [facility] ENDOSCOPY;                Service: Endoscopy;  Laterality: N/A; No date: JOINT REPLACEMENT     Comment:  left partial hip  09/12/2010: LAPAROSCOPIC SIGMOID COLECTOMY     Comment:  Lap hand assisted sigmoidectomy, mobilization splenic               flexure -- Dr. Dasie 09/05/2011: THROAT SURGERY     Comment:  cancer  BMI    Body Mass Index: 28.60 kg/m      Reproductive/Obstetrics negative OB ROS                             Anesthesia Physical Anesthesia Plan  ASA: 3  Anesthesia Plan: General ETT   Post-op Pain Management:  Toradol  IV (intra-op)*, Ofirmev  IV (intra-op)* and Dilaudid  IV   Induction: Intravenous  PONV Risk Score and Plan: 2 and Ondansetron , Dexamethasone , Midazolam  and Treatment may vary due to age or medical condition  Airway Management Planned: Oral ETT  Additional Equipment:   Intra-op Plan:   Post-operative Plan: Extubation in OR  Informed Consent: I have reviewed the patients History and Physical, chart, labs and discussed the procedure including the risks, benefits and alternatives for the proposed anesthesia with the patient or authorized representative who has indicated his/her understanding and acceptance.     Dental Advisory Given  Plan Discussed with: Anesthesiologist, CRNA and Surgeon  Anesthesia Plan Comments: (Patient consented for risks of anesthesia including but not limited to:  - adverse reactions to medications - damage to eyes, teeth, lips or other oral mucosa - nerve damage due to positioning  - sore throat or hoarseness - Damage to heart, brain, nerves, lungs, other parts of body or loss of life  Patient voiced understanding and assent.)        Anesthesia Quick Evaluation

## 2023-09-11 NOTE — Progress Notes (Signed)
 [facility] SURGICAL PROGRESS NOTE (cpt [id number])  Hospital Day(s): 8.   Interval History:  Patient seen and examined.  Patient with agitation last night, pulled NGT  This morning, he is resting in bed comfortably; NAD No fever He continues without leukocytosis; WBC 4.1K.  Hgb to 8.4; stable.  Renal function normalized; sCr - 0.92/BUN 19; UO - 2300 ccs.  No electrolyte derangements. Ostomy without prolapse; output 10 ccs  Vital signs in last 24 hours: [min-max] current  Temp:  [97.6 F (36.4 C)-97.9 F (36.6 C)] 97.6 F (36.4 C) (01/07 0746) Pulse Rate:  [69-83] 69 (01/07 0746) Resp:  [16-18] 18 (01/07 0746) BP: (99-130)/(62-83) 116/64 (01/07 0746) SpO2:  [96 %-100 %] 100 % (01/07 0746) Weight:  [93 kg] 93 kg (01/07 0500)     Height: 5' 11 (180.3 cm) Weight: 93 kg BMI (Calculated): 28.61   Intake/Output last 2 shifts:  01/06 0701 - 01/07 0700 In: 10 [I.V.:10] Out: 2310 [Urine:2300; Stool:10]   Physical Exam:  Constitutional: alert, cooperative and no distress  HENT: normocephalic without obvious abnormality Eyes: PERRL, EOM's grossly intact and symmetric  Respiratory: breathing non-labored at rest; undergoing breathing treatment  Cardiovascular: regular rate and sinus rhythm  Gastrointestinal: soft, non-tender, and non-distended. Colostomy without prolapse this AM; liquid stool in bag. Abdominal binder in place Genitourinary: Foley in place; good UO Musculoskeletal: no edema or wounds, motor and sensation grossly intact, NT    Labs:     Latest Ref Rng & Units 09/11/2023    5:56 AM 09/10/2023    5:59 AM 09/09/2023    7:24 AM  CBC  WBC 4.0 - 10.5 K/uL 4.1  4.6  3.1   Hemoglobin 13.0 - 17.0 g/dL 8.4  8.8  7.6   Hematocrit 39.0 - 52.0 % 27.0  28.0  24.4   Platelets 150 - 400 K/uL 193  214  168       Latest Ref Rng & Units 09/11/2023    5:56 AM 09/10/2023    5:59 AM 09/08/2023    4:58 AM  CMP  Glucose 70 - 99 mg/dL 891  880  867   BUN 8 - 23 mg/dL 19  21   11    Creatinine 0.61 - 1.24 mg/dL 9.07  9.13  9.15   Sodium 135 - 145 mmol/L 140  141  140   Potassium 3.5 - 5.1 mmol/L 4.1  4.0  3.7   Chloride 98 - 111 mmol/L 103  104  105   CO2 22 - 32 mmol/L 26  28  25    Calcium  8.9 - 10.3 mg/dL 8.4  8.6  8.1   Total Protein 6.5 - 8.1 g/dL 5.6  6.3  5.8   Total Bilirubin 0.0 - 1.2 mg/dL 0.6  0.4  0.3   Alkaline Phos 38 - 126 U/L 51  47  36   AST 15 - 41 U/L 33  57  16   ALT 0 - 44 U/L 58  48  13     Imaging studies:  No new imaging this AM  Assessment/Plan: (ICD-10's: K72.609) 73 y.o. 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 Discussed with patient's daughter yesterday and informed consent obtained  - For now, continue NPO. Okay for a few ice chips for comfort -  Continue TPN; monitor electrolytes   - Will replace NGT in OR unless nausea/emesis develop pre-operatively  - Monitor abdominal examination - Monitor colostomy output; record - Continue abdominal binder - Pain control prn; antiemetics prn - Further management per primary service; we will follow    All of the above findings and recommendations were discussed with the patient, and the medical team, and all of patient's questions were answered to his expressed satisfaction.  -- Marleta Lapierre, PA-C [facility] 09/11/2023, 8:00 AM M-F: 7am - 4pm
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
## 2023-09-11 NOTE — Anesthesia Procedure Notes (Signed)
 Central Venous Catheter Insertion Performed by: Chesley Lendia CROME, MD, anesthesiologist Start/End1/03/2024 10:45 AM, 09/11/2023 11:00 AM Preanesthetic checklist: patient identified, IV checked, site marked, risks and benefits discussed, surgical consent, monitors and equipment checked, pre-op evaluation, timeout performed and anesthesia consent Position: Trendelenburg Lidocaine  1% used for infiltration and patient sedated Hand hygiene performed , maximum sterile barriers used  and Seldinger technique used Catheter size: 8 Fr Total catheter length 16. Central line was placed.Triple lumen Procedure performed using ultrasound guided technique. Ultrasound Notes:anatomy identified, needle tip was noted to be adjacent to the nerve/plexus identified, no ultrasound evidence of intravascular and/or intraneural injection and image(s) printed for medical record Attempts: 1 Following insertion, dressing applied, line sutured and Biopatch. Post procedure assessment: blood return through all ports, free fluid flow and no air  Patient tolerated the procedure well with no immediate complications.

## 2023-09-11 NOTE — Transfer of Care (Signed)
 Immediate Anesthesia Transfer of Care Note  Patient: Calvin [name]  Procedure(s) Performed: EXPLORATORY LAPAROTOMY (Abdomen) LYSIS OF ADHESION (Abdomen) COLOSTOMY REVISION (Abdomen)  Patient Location: PACU  Anesthesia Type:General  Level of Consciousness: alert  and drowsy  Airway & Oxygen Therapy: Patient Spontanous Breathing and Patient connected to face mask oxygen  Post-op Assessment: Report given to RN and Post -op Vital signs reviewed and stable  Post vital signs: stable  Last Vitals:  Vitals Value Taken Time  BP 129/69 09/11/23 1457  Temp 36.1 C 09/11/23 1455  Pulse 78 09/11/23 1500  Resp 19 09/11/23 1500  SpO2 95 % 09/11/23 1500  Vitals shown include unfiled device data.  Last Pain:  Vitals:   09/11/23 0921  TempSrc: Temporal  PainSc: 3          Complications: No notable events documented.

## 2023-09-11 NOTE — Brief Op Note (Signed)
 09/11/2023  3:12 PM  PATIENT:  Calvin [name]  73 y.o. male  PRE-OPERATIVE DIAGNOSIS:  Small Bowel Obstruction  POST-OPERATIVE DIAGNOSIS:  Small Bowel Obstruction  PROCEDURE:  Procedure(s): EXPLORATORY LAPAROTOMY (N/A) LYSIS OF ADHESION (N/A) COLOSTOMY REVISION (N/A)  SURGEON:  Surgeons and Role:    * Desiderio Schanz, MD - Primary  PHYSICIAN ASSISTANT: Arthea Platt, PA-C  ANESTHESIA:   general  EBL:  10 mL   BLOOD ADMINISTERED:none  DRAINS: none   LOCAL MEDICATIONS USED:  BUPIVICAINE   SPECIMEN:  Source of Specimen:  Loop colostomy  DISPOSITION OF SPECIMEN:  PATHOLOGY  COUNTS:  YES  DICTATION: .Dragon Dictation  PLAN OF CARE: Admit to inpatient   PATIENT DISPOSITION:  PACU - hemodynamically stable.   Delay start of Pharmacological VTE agent (>24hrs) due to surgical blood loss or risk of bleeding: yes

## 2023-09-11 NOTE — Progress Notes (Signed)
 PROGRESS NOTE    Calvin [name]  FMW:[id number] DOB: 1951-03-12 DOA: 09/03/2023 PCP: Laurine Gladden, MD     Brief Narrative:   From admission h and p  Calvin [name] is a 73 y.o. 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   Symptoms started yesterday patient started to develop cramping-like abdominal pain, centrally located, associated with nauseous vomiting of stomach content, multiple times denies any blood or bile in the vomitus, no fever or chills.  Overnight patient started to have productive cough with light-colored phlegm no chest pains.   Assessment & Plan:   Principal Problem:   Acute respiratory failure with hypoxia (HCC) Active Problems:   Seizure (HCC)   Ogilvie syndrome   Paroxysmal atrial flutter (HCC)   Chronic diastolic CHF (congestive heart failure) (HCC)   Essential hypertension   CAD (coronary artery disease)   COPD (chronic obstructive pulmonary disease) (HCC)   History of CVA (cerebrovascular accident)   Sepsis (HCC)   Colostomy prolapse (HCC)   Small bowel obstruction due to adhesions (HCC)  # Flu A Slowly improving, s/p tamiflu   # CAP LLL infiltrate vs edema. Aspiration risk, worth covering for that. cefepme/flagyl >unasyn , Abx started 12/30, now s/p 5 days treatment - monitor off abx  # Sepsis, severe By fever, tachycardia, elevated lactic acid. Hemodynamically stable. Lactate normalized - continue fluids while npo  # Acute hypoxic respiratory failure 2/2 flu/cap, currently on 2 liters and stable, satting well - wean Kyle o2 as able  # SBO, mechanical History ogilvie syndrome s/p colostomy, here with some vomiting, kub showing signs of obstruction. Gen surg  following. Several episodes emesis 1/1, ng tube placed. Today s/p ex-lab with lysis of adhesions - NG tube in place - TPN running - advance diet per gen surg  # Delirium Secondary to above processes.  - mitts to safely continue NG tube  # Anemia Hgb has drifted from baseline of 11-12 to 8s and now relatively stable there though slight decrease today to 7.6, no bleeding. discussed with GI Dr. 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 Worsening. Loop colostomy transitioned to end colostomy today with gen surg  # COPD Some wheeze, ill as above. S/p 6 days prednisone   # AKI on ckd 2 Prerenal 2/2 dehydration, resolved w/ fluids - continue IVF while npo  # Lactic acidosis 2/2 above processes, resolved - continue IVF   # Seizure disorder With recent hospitalization for breakthrough seizures - continue keppra , home lamictal  and pregabalin   # hx CVA  With hemiplegia - hold asa given anemia as above  # BPH  - cont home flomax   # HFpEF Dehydrated as above - judicious IV fluids  # Debility Resides at snf - PT consulted, advising SNF  DVT prophylaxis: scds Code Status: full Family Communication: daughter stephanie updated at bedside 1/5. No answer when telephoned 1/6.   Level of care: Telemetry Medical Status is: Inpatient Remains inpatient appropriate because: severity of illness    Consultants:  Gen surg  Procedures: pending  Antimicrobials:  See above    Subjective: Stable pain after surgery  Objective: Vitals:   09/11/23 1530 09/11/23 1535 09/11/23 1540 09/11/23 1620  BP: 101/83  128/73 126/60  Pulse: 66 67 66 66  Resp: 14 10 10 17   Temp:    98.1 F (36.7 C)  TempSrc:       SpO2: 97% 98% 98% 94%  Weight:      Height:        Intake/Output Summary (Last 24 hours) at 09/11/2023 1754 Last data filed at 09/11/2023 1530 Gross per 24 hour  Intake 2510 ml  Output 3320 ml  Net -810 ml   Filed Weights   09/09/23 0411 09/10/23 0500 09/11/23 0500  Weight: 96.6 kg 94.4 kg 93 kg    Examination:  General exam: chronically ill appearing Respiratory system: normal rate and wob, scattered rhonchi, rales at base Cardiovascular system: S1 & S2 heard, tachycardic Gastrointestinal system: Abdomen is mildly distended with bandage over midline incision c/d/I, ostomy in place Central nervous system: confused, Left sided weakness Extremities: warm, trace LE edema Skin: No visible rashes, lesions or ulcers Psychiatry: calm    Data Reviewed: I have personally reviewed following labs and imaging studies  CBC: Recent Labs  Lab 09/07/23 0351 09/08/23 0458 09/09/23 0724 09/10/23 0559 09/11/23 0556  WBC 4.0 3.0* 3.1* 4.6 4.1  HGB 8.7* 8.1* 7.6* 8.8* 8.4*  HCT 28.3* 25.9* 24.4* 28.0* 27.0*  MCV 94.6 94.2 94.6 94.3 97.1  PLT 168 160 168 214 193   Basic Metabolic Panel: Recent Labs  Lab 09/06/23 0622 09/07/23 0351 09/08/23 0458 09/10/23 0559 09/11/23 0556  NA 137 140 140 141 140  K 4.0 3.5 3.7 4.0 4.1  CL 103 106 105 104 103  CO2 25 25 25 28 26   GLUCOSE 108* 88 132* 119* 108*  BUN 15 10 11 21 19   CREATININE 1.06 1.03 0.84 0.86 0.92  CALCIUM  7.7* 7.9* 8.1* 8.6* 8.4*  MG  --  1.7 2.1 1.9 1.9  PHOS  --  2.5 2.8 3.4 5.2*   GFR: Estimated Creatinine Clearance: 82.1 mL/min (by C-G formula based on SCr of 0.92 mg/dL). Liver Function Tests: Recent Labs  Lab 09/07/23 0351 09/08/23 0458 09/10/23 0559 09/11/23 0556  AST 15 16 57* 33  ALT 17 13 48* 58*  ALKPHOS 39 36* 47 51  BILITOT 0.5 0.3 0.4 0.6  PROT 5.8* 5.8* 6.3* 5.6*  ALBUMIN  2.4* 2.5* 2.7* 2.5*   No results for input(s): LIPASE, AMYLASE in the last 168 hours. No results for input(s):  AMMONIA in the last 168 hours. Coagulation Profile: No results for input(s): INR, PROTIME in the last 168 hours.  Cardiac Enzymes: No results for input(s): CKTOTAL, CKMB, CKMBINDEX, TROPONINI in the last 168 hours. BNP (last 3 results) No results for input(s): PROBNP in the last 8760 hours. HbA1C: No results for input(s): HGBA1C in the last 72 hours. CBG: Recent Labs  Lab 09/10/23 2347 09/11/23 0446 09/11/23 0859 09/11/23 0939 09/11/23 1625  GLUCAP 81 98 113* 106* 153*   Lipid Profile: Recent Labs    09/10/23 0559  TRIG 103   Thyroid  Function Tests: No results for input(s): TSH, T4TOTAL, FREET4, T3FREE, THYROIDAB in the last 72 hours. Anemia Panel: No results for input(s): VITAMINB12, FOLATE, FERRITIN, TIBC, IRON, RETICCTPCT in the last 72 hours.  Urine analysis:    Component Value Date/Time   COLORURINE AMBER (A) 09/03/2023 0543   APPEARANCEUR CLOUDY (A) 09/03/2023 0543  APPEARANCEUR Clear 08/14/2014 0933   LABSPEC 1.026 09/03/2023 0543   LABSPEC 1.015 08/14/2014 0933   PHURINE 5.0 09/03/2023 0543   GLUCOSEU NEGATIVE 09/03/2023 0543   GLUCOSEU Negative 08/14/2014 0933   HGBUR NEGATIVE 09/03/2023 0543   BILIRUBINUR NEGATIVE 09/03/2023 0543   BILIRUBINUR Negative 08/14/2014 0933   KETONESUR NEGATIVE 09/03/2023 0543   PROTEINUR 30 (A) 09/03/2023 0543   UROBILINOGEN 0.2 06/12/2010 0103   NITRITE NEGATIVE 09/03/2023 0543   LEUKOCYTESUR LARGE (A) 09/03/2023 0543   LEUKOCYTESUR Negative 08/14/2014 0933   Sepsis Labs: @LABRCNTIP (procalcitonin:4,lacticidven:4)  ) Recent Results (from the past 240 hours)  Blood Culture (routine x 2)     Status: None   Collection Time: 09/03/23  3:08 AM   Specimen: BLOOD  Result Value Ref Range Status   Specimen Description BLOOD BLOOD RIGHT ARM  Final   Special Requests   Final    BOTTLES DRAWN AEROBIC AND ANAEROBIC Blood Culture results may not be optimal due to an inadequate volume of  blood received in culture bottles   Culture   Final    NO GROWTH 5 DAYS Performed at [facility], [address] [place], [place] [address]    Report Status 09/08/2023 FINAL  Final  Resp panel by RT-PCR (RSV, Flu A&B, Covid) Anterior Nasal Swab     Status: Abnormal   Collection Time: 09/03/23  3:18 AM   Specimen: Anterior Nasal Swab  Result Value Ref Range Status   SARS Coronavirus 2 by RT PCR NEGATIVE NEGATIVE Final    Comment: (NOTE) SARS-CoV-2 target nucleic acids are NOT DETECTED.  The SARS-CoV-2 RNA is generally detectable in upper respiratory specimens during the acute phase of infection. The lowest concentration of SARS-CoV-2 viral copies this assay can detect is 138 copies/mL. A negative result does not preclude SARS-Cov-2 infection and should not be used as the sole basis for treatment or other patient management decisions. A negative result may occur with  improper specimen collection/handling, submission of specimen other than nasopharyngeal swab, presence of viral mutation(s) within the areas targeted by this assay, and inadequate number of viral copies(<138 copies/mL). A negative result must be combined with clinical observations, patient history, and epidemiological information. The expected result is Negative.  Fact Sheet for Patients:  bloggercourse.com  Fact Sheet for Healthcare Providers:  seriousbroker.it  This test is no t yet approved or cleared by the [place]  FDA and  has been authorized for detection and/or diagnosis of SARS-CoV-2 by FDA under an Emergency Use Authorization (EUA). This EUA will remain  in effect (meaning this test can be used) for the duration of the COVID-19 declaration under Section 564(b)(1) of the Act, 21 U.S.C.section 360bbb-3(b)(1), unless the authorization is terminated  or revoked sooner.       Influenza A by PCR POSITIVE (A) NEGATIVE Final   Influenza B by  PCR NEGATIVE NEGATIVE Final    Comment: (NOTE) The Xpert Xpress SARS-CoV-2/FLU/RSV plus assay is intended as an aid in the diagnosis of influenza from Nasopharyngeal swab specimens and should not be used as a sole basis for treatment. Nasal washings and aspirates are unacceptable for Xpert Xpress SARS-CoV-2/FLU/RSV testing.  Fact Sheet for Patients: bloggercourse.com  Fact Sheet for Healthcare Providers: seriousbroker.it  This test is not yet approved or cleared by the [place]  FDA and has been authorized for detection and/or diagnosis of SARS-CoV-2 by FDA under an Emergency Use Authorization (EUA). This EUA will remain in effect (meaning this test can be used) for the duration  of the COVID-19 declaration under Section 564(b)(1) of the Act, 21 U.S.C. section 360bbb-3(b)(1), unless the authorization is terminated or revoked.     Resp Syncytial Virus by PCR NEGATIVE NEGATIVE Final    Comment: (NOTE) Fact Sheet for Patients: bloggercourse.com  Fact Sheet for Healthcare Providers: seriousbroker.it  This test is not yet approved or cleared by the [place]  FDA and has been authorized for detection and/or diagnosis of SARS-CoV-2 by FDA under an Emergency Use Authorization (EUA). This EUA will remain in effect (meaning this test can be used) for the duration of the COVID-19 declaration under Section 564(b)(1) of the Act, 21 U.S.C. section 360bbb-3(b)(1), unless the authorization is terminated or revoked.  Performed at [facility], [address] [place], [place] [address]   Blood Culture (routine x 2)     Status: None   Collection Time: 09/03/23  3:18 AM   Specimen: BLOOD  Result Value Ref Range Status   Specimen Description BLOOD RIGHT ANTECUBITAL  Final   Special Requests   Final    BOTTLES DRAWN AEROBIC AND ANAEROBIC Blood Culture results may not be  optimal due to an inadequate volume of blood received in culture bottles   Culture   Final    NO GROWTH 5 DAYS Performed at [facility], [address] [place], [place] [address]    Report Status 09/08/2023 FINAL  Final  Urine Culture     Status: Abnormal   Collection Time: 09/03/23  5:43 AM   Specimen: Urine, Random  Result Value Ref Range Status   Specimen Description   Final    URINE, RANDOM Performed at [facility] [address] [place], [place] [address]    Special Requests   Final    NONE Reflexed from [id number] Performed at [facility], [address] [place], [place] [address]    Culture 30,000 COLONIES/mL STAPHYLOCOCCUS AUREUS (A)  Final   Report Status 09/05/2023 FINAL  Final   Organism ID, Bacteria STAPHYLOCOCCUS AUREUS (A)  Final      Susceptibility   Staphylococcus aureus - MIC*    CIPROFLOXACIN  >=8 RESISTANT Resistant     GENTAMICIN <=0.5 SENSITIVE Sensitive     NITROFURANTOIN <=16 SENSITIVE Sensitive     OXACILLIN >=4 RESISTANT Resistant     TETRACYCLINE <=1 SENSITIVE Sensitive     VANCOMYCIN  1 SENSITIVE Sensitive     TRIMETH/SULFA >=320 RESISTANT Resistant     RIFAMPIN <=0.5 SENSITIVE Sensitive     Inducible Clindamycin  NEGATIVE Sensitive     LINEZOLID 2 SENSITIVE Sensitive     * 30,000 COLONIES/mL STAPHYLOCOCCUS AUREUS  MRSA Next Gen by PCR, Nasal     Status: Abnormal   Collection Time: 09/03/23 11:00 AM   Specimen: Nasal Mucosa; Nasal Swab  Result Value Ref Range Status   MRSA by PCR Next Gen DETECTED (A) NOT DETECTED Final    Comment: RESULT CALLED TO, READ BACK BY AND VERIFIED WITH: JACOB MOORE @1703  09/03/23 MJU (NOTE) The GeneXpert MRSA Assay (FDA approved for NASAL specimens only), is one component of a comprehensive MRSA colonization surveillance program. It is not intended to diagnose MRSA infection nor to guide or monitor treatment for MRSA infections. Test performance is not FDA approved in patients less than  32 years old. Performed at [facility], [address] [place], [place] [address]          Radiology Studies: [facility] Chest Burchard 1 View Result Date: 09/11/2023 CLINICAL DATA:  Encounter for central line  placement EXAM: PORTABLE CHEST 1 VIEW COMPARISON:  Chest x-ray 09/07/2023.  Abdominal x-ray 09/10/2023. FINDINGS: There is a new right IJ catheter with distal tip projecting over the right atrium. Right upper extremity PICC terminates in the distal SVC. Enteric tube extends below the diaphragm and has been pulled back with distal tip now just beyond the gastroesophageal junction. Heart is mildly enlarged. There central pulmonary vascular congestion. There is left basilar atelectasis. There is no pleural effusion or pneumothorax. No acute fracture identified. IMPRESSION: 1. New right IJ catheter with distal tip projecting over the right atrium. No pneumothorax. 2. Enteric tube has been pulled back with distal tip now just beyond the gastroesophageal junction. Recommend advancing tube 10 cm. 3. Mild cardiomegaly with central pulmonary vascular congestion. 4. Left basilar atelectasis. Electronically Signed   By: Greig Pique M.D.   On: 09/11/2023 15:40   DG Abd 2 Views Result Date: 09/10/2023 CLINICAL DATA:  Abdominal pain.  Small-bowel obstruction. EXAM: ABDOMEN - 2 VIEW COMPARISON:  09/08/2023 and older exams.  CT, 09/06/2023. FINDINGS: There is dilated proximal small bowel projecting in the central abdomen, similar to the prior exams consistent with a partial small bowel obstruction. No free air. Nasogastric tube well positioned, tip in the distal stomach. Small amount of residual contrast noted in a mostly decompressed left colon. IMPRESSION: 1. Persistent partial small bowel obstruction. Electronically Signed   By: Alm Parkins M.D.   On: 09/10/2023 09:07        Scheduled Meds:  baclofen   5 mg Oral BID   budesonide  (PULMICORT ) nebulizer solution  0.25 mg Nebulization BID   [START ON  09/12/2023] Chlorhexidine  Gluconate Cloth  6 each Topical Q0600   guaiFENesin   600 mg Oral Q12H   insulin  aspart  0-9 Units Subcutaneous Q4H   ipratropium-albuterol   3 mL Inhalation BID   ketorolac   15 mg Intravenous Q6H   lactulose   20 g Oral Daily   lamoTRIgine   50 mg Oral BID   melatonin  10 mg Oral QHS   mometasone -formoterol   2 puff Inhalation BID   mupirocin  ointment  1 Application Nasal BID   pantoprazole  (PROTONIX ) IV  40 mg Intravenous Q12H   polyethylene glycol  17 g Oral BID   pregabalin   200 mg Oral QHS   sodium chloride  flush  10-40 mL Intracatheter Q12H   tamsulosin   0.4 mg Oral QPM   thiamine  (VITAMIN B1) injection  100 mg Intravenous Q24H   Continuous Infusions:  acetaminophen      levETIRAcetam  1,000 mg (09/10/23 2144)   magnesium  sulfate bolus IVPB     TPN ADULT (ION) 80 mL/hr at 09/10/23 1748   TPN ADULT (ION)       LOS: 8 days     Devaughn KATHEE Ban, MD [facility]   If 7PM-7AM, please contact night-coverage [email or web address] Password [facility] 09/11/2023, 5:54 PM

## 2023-09-11 NOTE — Op Note (Addendum)
 Procedure Date:  09/11/2023  Pre-operative Diagnosis:  Small bowel obstruction and loop colostomy prolapse  Post-operative Diagnosis: Small bowel obstruction and loop colostomy prolapse  Procedure:  Exploratory Laparotomy, lysis of adhesions, revision of loop colostomy into end colostomy with mucous fistula.  Surgeon:  Aloysius Sheree Plant, MD  Assistant:  Arthea Platt, PA-C  Anesthesia:  General endotracheal  Estimated Blood Loss:  15 ml  Specimens:  Loop colostomy  Complications:  None  Findings:  Patient had two separate issues ongoing at the same time.  He had a proximal partial small bowel obstruction and also a colostomy prolapse.  These were not contributing to each other -- the partial SBO was not causing the prolapse, and the prolapse was not contributing to the SBO.  Indications for Procedure:  This is a 73 y.o. male who presents with both small bowel obstruction and colostomy prolapse.  The risks of bleeding, abscess or infection, injury to surrounding structures, and need for further procedures were all discussed with the patient and was willing to proceed.  Description of Procedure: The patient was correctly identified in the preoperative area and brought into the operating room.  The patient was placed supine with VTE prophylaxis in place.  Appropriate time-outs were performed.  Anesthesia was induced and the patient was intubated.  Foley catheter was already in place.  Appropriate antibiotics were infused.  NG tube was inserted.  The loop colostomy was sutured closed in both openings with 2-0 Silk suture.  The abdomen was prepped and draped in a sterile fashion.  A midline incision was made and electrocautery was used to dissect down the subcutaneous tissue to the fascia.  The fascia was incised and extended superiorly and inferiorly.  There was distended proximal small bowel, followed by decompressed distal jejunum and ileum.  We started running the small bowel and  encountered an internal hernia created by a wide area of thick adhesion of the proximal small bowel to distal small bowel.  This was taken down carefully using combination of cautery and sharp Metzenbaum dissection.  This resulted in two small areas of serosal injury without true full thickness injury.  These two areas were oversown with 3-0 Silk sutures.  Following that, the small bowel was able to be fully run and there were no further adhesions and no injuries.  At this point, we turned our attention to the colostomy.  The colostomy was dissected free from the mucocutaneous junction and subcutaneous fat and fascia using cautery and blunt dissection.  Once fully mobilized, the colostomy portion was transected both proximally and distally using GIA blue load 75 mm staplers.  LigaSure was used to take the mesentery of this portion and the specimen was sent to pathology.  This gave us  the room to palpate the patient's stomach and determine location of the NG tube to be in good place.  Then, we were able to determine how much give the transverse colon had and we were able to excise an additional 5 cm of transverse colon to create a healthy, appropriate length end colostomy.  Distally, the transverse colon had appropriate reach and no further resection was needed for a mucous fistula creation.  The mesenteric defect between sections of the transverse colon was closed using 3-0 Silk suture.  Exparel  solution mixed with 0.5% bupivacaine  with epi and saline was infiltrated over the peritoneum, fascia, and subcutaneous tissue of both incisions.    We then proceeded with our clean closure.  Gowns and gloves were exchanged.  The midline fascia was then closed using #1 PDS sutures.  The midline wound was irrigated and closed using 3-0 Vicryl for deep layer and staples for the skin.  The wound was cleaned and dressed with Honeycomb dressing. The fascial opening for the loop colostomy was noted to be a bit wide for the end  colostomy and mucous fistula so the opening was approximated with 2-0 Vicryl sutures.  We then sutured the fascial edges onto the surrounding tissue of the two segments of transverse colon to prevent any hernia or further prolapse.  Then the mucous fistula was created by opening a corner of the distal transverse colon segment and securing it laterally to the prior ostomy site using 3-0 Silk sutures.  Then the proximal transverse colon segment was matured as an end colostomy in [place] fashion, securing it laterally to the mucous fistula.  3-0 Silk sutures were used to close any residual gaps between skin and stoma/fistula.  The skin was cleaned and an ostomy appliance was cut to size and applied.  The patient was emerged from anesthesia and extubated and brought to the recovery room for further management.  The patient tolerated the procedure well and all counts were correct at the end of the case.  Please note that Mr. Terryl was scrubbed in for the entirety of the case.  His assistance was critical and he assisted throughout the whole procedure, with dissection, retraction, midline closure and ostomy creation.  Aloysius Sheree Plant, MD

## 2023-09-11 NOTE — Progress Notes (Signed)
 OR tech transported to OR at 0855 in bed in stable condition.  Patient returned to floor in bed with family present in stable condition.  Patient alert and oriented x 4 with garbled speech, increased lethargy.  Abdominal incision with honeycomb dressing with old, marked drainage.  NG tube placed to intermittent suction, bowel sound hypoactive. X-ray report enteric tube pulled back with distal tip beyond GE junction; recommendation to advance 10 cm.  Dr Desiderio notified of recommendation.

## 2023-09-11 NOTE — Plan of Care (Addendum)
 Patient alert to self. Cooperative took meds crushed with ice chips. Did refuse miralax  provider notified. Patient NPO schedule for surgery 1/7 at 1500. On airborne precautions positive for influenza. On 2L of O2 sat at 96%. Has inhaler and breathing treatments. All safety precautions maintained. Abx IV right upper arm PICC line infusing TPN 70ml/hr dressing C/D/I. Bed locked lowest position and call bell in reach.  Problem: Health Behavior/Discharge Planning: Goal: Ability to manage health-related needs will improve Outcome: Progressing   Problem: Clinical Measurements: Goal: Will remain free from infection Outcome: Progressing Goal: Diagnostic test results will improve Outcome: Progressing Goal: Respiratory complications will improve Outcome: Progressing Goal: Cardiovascular complication will be avoided Outcome: Progressing   Problem: Nutrition: Goal: Adequate nutrition will be maintained Outcome: Progressing   Problem: Coping: Goal: Level of anxiety will decrease Outcome: Progressing   Problem: Safety: Goal: Ability to remain free from injury will improve Outcome: Progressing   Problem: Education: Goal: Knowledge of disease or condition will improve Outcome: Progressing   Problem: Activity: Goal: Ability to tolerate increased activity will improve Outcome: Progressing   Problem: Respiratory: Goal: Ability to maintain a clear airway will improve Outcome: Progressing

## 2023-09-11 NOTE — Anesthesia Procedure Notes (Signed)
 Procedure Name: Intubation Date/Time: 09/11/2023 11:17 AM  Performed by: Jestine Palma, CRNAPre-anesthesia Checklist: Patient identified, Emergency Drugs available, Suction available and Patient being monitored Patient Re-evaluated:Patient Re-evaluated prior to induction Oxygen Delivery Method: Circle system utilized Preoxygenation: Pre-oxygenation with 100% oxygen Induction Type: IV induction Laryngoscope Size: McGrath and 3 Grade View: Grade I Tube type: Oral Tube size: 7.5 mm Number of attempts: 1 Airway Equipment and Method: Stylet and Oral airway Placement Confirmation: ETT inserted through vocal cords under direct vision, positive ETCO2 and breath sounds checked- equal and bilateral Secured at: 21 cm Tube secured with: Tape (tube tamer) Dental Injury: Teeth and Oropharynx as per pre-operative assessment  Comments: Thick yellow mucous secretions noted above cords

## 2023-09-11 NOTE — Progress Notes (Signed)
 Nutrition Follow-up  DOCUMENTATION CODES:   Not applicable  INTERVENTION:   -TPN management per pharmacy -MVI daily via TPN -100 mg thiamine  x 7 days -Pharmacy to monitor electrolyte and replete as needed; pt is at high risk for refeeding syndrome   NUTRITION DIAGNOSIS:   Inadequate oral intake related to altered GI function as evidenced by NPO status.  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  12/31- s/p BSE- plan for dysphagia 1 diet with nectar thick liquids when cleared for diet per SLP 1/1- NGT placed (per KUB on 09/05/23 revealed tip of tube in stomach)  1/3- PICC placed, TPN 1/4- NGT fell out and replaced 1/6- NGT pulled again  Reviewed I/O's: -2.3 L x 24 hours and 14 L since admission  UOP: 2.3 L x 24 hours  Colostomy output: 10 ml x 24 hours  Pt down in OR for for exlap, lysis of adhesions, and ostomy revision at time of visit. Per general surgery notes, ostomy still prolapsing, but not as bad as yesterday.   Pt remains NPO and on TPN for nutritional support at 80 ml/hr,m which provides 1992 kcals and 106 grams protein, meeting 100% of estimated kcal and protein needs.   Wt has been stable over the past week.   Medications reviewed and include lactulose , protonix , miralax , keppra , magnesium , and thiamine .   Labs reviewed: K and Mg WDL. PHos: 5.2, CBGS: 81-113 (inpatient orders for glycemic control are 0-9 units insulin  aspart every 4 hours).    Diet Order:    Diet Order             Diet NPO time specified Except for: Sips with Meds, Ice Chips  Diet effective now                   EDUCATION NEEDS:   Not appropriate for education at this time  Skin:  Skin Assessment: Skin Integrity Issues: Skin Integrity Issues:: Unstageable Unstageable: rt and lt heels  Last BM:  09/09/23 (10 ml via colostomy)  Height:   Ht Readings from Last 1 Encounters:  09/04/23 5' 11 (1.803 m)    Weight:   Wt Readings from Last 1 Encounters:  09/11/23 93 kg    Ideal Body Weight:  78.1 kg  BMI:  Body mass index is 28.6 kg/m.  Estimated Nutritional Needs:   Kcal:  2150-2350  Protein:  105-120 grams  Fluid:  2-2.2 L    Margery ORN, RD, LDN, CDCES Registered Dietitian III Certified Diabetes Care and Education Specialist If unable to reach this RD, please use RD Inpatient group chat on secure chat between hours of 8am-4 pm daily

## 2023-09-12 ENCOUNTER — Encounter: Payer: Self-pay | Admitting: Surgery

## 2023-09-12 DIAGNOSIS — J9601 Acute respiratory failure with hypoxia: Secondary | ICD-10-CM | POA: Diagnosis not present

## 2023-09-12 LAB — COMPREHENSIVE METABOLIC PANEL
ALT: 38 U/L (ref 0–44)
AST: 20 U/L (ref 15–41)
Albumin: 2.5 g/dL — ABNORMAL LOW (ref 3.5–5.0)
Alkaline Phosphatase: 44 U/L (ref 38–126)
Anion gap: 10 (ref 5–15)
BUN: 24 mg/dL — ABNORMAL HIGH (ref 8–23)
CO2: 26 mmol/L (ref 22–32)
Calcium: 8 mg/dL — ABNORMAL LOW (ref 8.9–10.3)
Chloride: 105 mmol/L (ref 98–111)
Creatinine, Ser: 1.08 mg/dL (ref 0.61–1.24)
GFR, Estimated: >60 mL/min (ref >60)
Glucose, Bld: 150 mg/dL — ABNORMAL HIGH (ref 70–99)
Potassium: 4.6 mmol/L (ref 3.5–5.1)
Sodium: 141 mmol/L (ref 135–145)
Total Bilirubin: 0.3 mg/dL (ref 0.0–1.2)
Total Protein: 5.2 g/dL — ABNORMAL LOW (ref 6.5–8.1)

## 2023-09-12 LAB — GLUCOSE, CAPILLARY
Glucose-Capillary: 164 mg/dL — ABNORMAL HIGH (ref 70–99)
Glucose-Capillary: 133 mg/dL — ABNORMAL HIGH (ref 70–99)
Glucose-Capillary: 137 mg/dL — ABNORMAL HIGH (ref 70–99)
Glucose-Capillary: 127 mg/dL — ABNORMAL HIGH (ref 70–99)
Glucose-Capillary: 102 mg/dL — ABNORMAL HIGH (ref 70–99)

## 2023-09-12 LAB — CBC
HCT: 23.9 % — ABNORMAL LOW (ref 39.0–52.0)
Hemoglobin: 7.4 g/dL — ABNORMAL LOW (ref 13.0–17.0)
MCH: 30 pg (ref 26.0–34.0)
MCHC: 31 g/dL (ref 30.0–36.0)
MCV: 96.8 fL (ref 80.0–100.0)
Platelets: 168 K/uL (ref 150–400)
RBC: 2.47 MIL/uL — ABNORMAL LOW (ref 4.22–5.81)
RDW: 14.4 % (ref 11.5–15.5)
WBC: 8.3 K/uL (ref 4.0–10.5)
nRBC: 0 % (ref 0.0–0.2)

## 2023-09-12 LAB — MAGNESIUM: Magnesium: 1.9 mg/dL (ref 1.7–2.4)

## 2023-09-12 LAB — PHOSPHORUS: Phosphorus: 4.4 mg/dL (ref 2.5–4.6)

## 2023-09-12 MED ORDER — TRAVASOL 10 % IV SOLN
INTRAVENOUS | Status: AC
  Filled 2023-09-12: qty 1056

## 2023-09-12 MED ORDER — MAGNESIUM SULFATE 2 GM/50ML IV SOLN
2.0000 g | Freq: Once | INTRAVENOUS | Status: AC
  Administered 2023-09-12: 2 g via INTRAVENOUS
  Filled 2023-09-12: qty 50

## 2023-09-12 NOTE — Progress Notes (Addendum)
 Patient having blood tinged gastric content coming from NG tube. Notified Modou Jawo NP via secure chat. NNO at this time RN will continue to monitor and notify of any changes

## 2023-09-12 NOTE — Progress Notes (Signed)
 [facility] SURGICAL PROGRESS NOTE  Hospital Day(s): 9.   Post op day(s): 1 Day Post-Op.   Interval History:  Patient seen and examined No acute events or new complaints overnight.  Patient reports he is doing okay; abdominal soreness No fever, chills Continues without leukocytosis; WBC 8.3K Hgb to 7.4 (from 8.4) sCr - 1.08/BUN 24; UO - 1750 ccs No electrolyte derangements NGT with 650 ccs out; enteric contents, no blood  Colostomy with 10 ccs recorded; serosanguinous   Vital signs in last 24 hours: [min-max] current  Temp:  [97 F (36.1 C)-98.3 F (36.8 C)] 97.6 F (36.4 C) (01/08 0735) Pulse Rate:  [62-83] 70 (01/08 0735) Resp:  [10-25] 18 (01/08 0735) BP: (101-132)/(60-83) 125/68 (01/08 0735) SpO2:  [88 %-100 %] 99 % (01/08 0804) Weight:  [96 kg] 96 kg (01/08 0347)     Height: 5' 11 (180.3 cm) Weight: 96 kg BMI (Calculated): 29.53   Intake/Output last 2 shifts:  01/07 0701 - 01/08 0700 In: 3185.3 [I.V.:2235.3; IV Piggyback:950] Out: 2420 [Urine:1750; Emesis/NG output:650; Stool:10; Blood:10]   Physical Exam:  Constitutional: alert, cooperative and no distress  HEENT: NGT in place; output without blood  Respiratory: breathing non-labored at rest Cardiovascular: regular rate and sinus rhythm  Gastrointestinal: soft, incisional soreness, and non-distended. No rebound/guarding. 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; colostomy edematous expectedly, serosanguinous drainage in bag. No prolapse  Genitourinary: Foley in place  Integumentary: Laparotomy is CDI with staples and honeycomb   Labs:     Latest Ref Rng & Units 09/12/2023    5:30 AM 09/11/2023    5:56 AM 09/10/2023    5:59 AM  CBC  WBC 4.0 - 10.5 K/uL 8.3  4.1  4.6   Hemoglobin 13.0 - 17.0 g/dL 7.4  8.4  8.8   Hematocrit 39.0 - 52.0 % 23.9  27.0  28.0   Platelets 150 - 400 K/uL 168  193  214       Latest Ref Rng & Units 09/12/2023    5:30 AM 09/11/2023    5:56 AM 09/10/2023    5:59 AM  CMP  Glucose  70 - 99 mg/dL 849  891  880   BUN 8 - 23 mg/dL 24  19  21    Creatinine 0.61 - 1.24 mg/dL 8.91  9.07  9.13   Sodium 135 - 145 mmol/L 141  140  141   Potassium 3.5 - 5.1 mmol/L 4.6  4.1  4.0   Chloride 98 - 111 mmol/L 105  103  104   CO2 22 - 32 mmol/L 26  26  28    Calcium  8.9 - 10.3 mg/dL 8.0  8.4  8.6   Total Protein 6.5 - 8.1 g/dL 5.2  5.6  6.3   Total Bilirubin 0.0 - 1.2 mg/dL 0.3  0.6  0.4   Alkaline Phos 38 - 126 U/L 44  51  47   AST 15 - 41 U/L 20  33  57   ALT 0 - 44 U/L 38  58  48      Imaging studies: No new pertinent imaging studies   Assessment/Plan: 73 y.o. male 1 Day Post-Op s/p exploratory laparotomy, LOA, and colostomy revision   - Will await return of bowel function; Continue NPO for now; okay for a few ice chips for comfort   - Continue NGT decompression; LIS; monitor and record output  - Continue TPN; monitor electrolytes   - Monitor abdominal examination - Monitor colostomy output; record - Pain  control prn; antiemetics prn - Further management per primary service; we will follow    All of the above findings and recommendations were discussed with the patient, and the medical team, and all of patient's questions were answered to his expressed satisfaction.  -- Arthea Platt, PA-C [facility] 09/12/2023, 8:35 AM M-F: 7am - 4pm
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
## 2023-09-12 NOTE — Progress Notes (Signed)
 PROGRESS NOTE    Calvin [name]  FMW:[id number] DOB: 30-Dec-1950 DOA: 09/03/2023 PCP: Laurine Gladden, MD     Brief Narrative:   From admission h and p  Calvin [name] is a 73 y.o. 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   Symptoms started yesterday patient started to develop cramping-like abdominal pain, centrally located, associated with nauseous vomiting of stomach content, multiple times denies any blood or bile in the vomitus, no fever or chills.  Overnight patient started to have productive cough with light-colored phlegm no chest pains.   Assessment & Plan:   Principal Problem:   Acute respiratory failure with hypoxia (HCC) Active Problems:   Seizure (HCC)   Ogilvie syndrome   Paroxysmal atrial flutter (HCC)   Chronic diastolic CHF (congestive heart failure) (HCC)   Essential hypertension   CAD (coronary artery disease)   COPD (chronic obstructive pulmonary disease) (HCC)   History of CVA (cerebrovascular accident)   Sepsis (HCC)   Colostomy prolapse (HCC)   Small bowel obstruction due to adhesions (HCC)  # Flu A Slowly improving, s/p tamiflu   # CAP LLL infiltrate vs edema. Aspiration risk, worth covering for that. cefepme/flagyl >unasyn , Abx started 12/30, now s/p 5 days treatment - monitor off abx  # Sepsis, severe By fever, tachycardia, elevated lactic acid. Hemodynamically stable. Lactate normalized - continue fluids while npo  # Acute hypoxic respiratory failure 2/2 flu/cap, currently on 2 liters and stable, satting well - wean Imperial o2 as able  # SBO, mechanical History ogilvie syndrome s/p colostomy, here with some vomiting, kub showing signs of obstruction. Gen surg  following. Several episodes emesis 1/1, ng tube placed. Today s/p ex-lab with lysis of adhesions on 1/7 - NG tube in place - TPN running - advance diet per gen surg  # Delirium Secondary to above processes.  - mitts to safely continue NG tube  # Anemia Hgb has drifted from baseline of 11-12 to 8s and now relatively stable there though now 7.4 after surgery yesterday. discussed with GI Dr. 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 Worsening. Loop colostomy revised to end colostomy 1/7  # COPD Stable, S/p 6 days prednisone   # AKI on ckd 2 Prerenal 2/2 dehydration, resolved w/ fluids - continue IVF while npo  # Lactic acidosis 2/2 above processes, resolved - continue IVF   # Seizure disorder With recent hospitalization for breakthrough seizures - continue keppra , home lamictal  and pregabalin   # hx CVA  With hemiplegia - hold asa given anemia as above  # BPH  - cont home flomax   # HFpEF Dehydrated as above - judicious IV fluids  # Debility Resides at snf - PT consulted, advising SNF  DVT prophylaxis: scds Code Status: full Family Communication: daughter stephanie updated telephonically 1/8  Level of care: Telemetry Medical Status is: Inpatient Remains inpatient appropriate because: severity of illness    Consultants:  Gen surg  Procedures: pending  Antimicrobials:  See above    Subjective: Moderate abdominal pain  Objective: Vitals:   09/12/23 0347 09/12/23 0735 09/12/23 0804 09/12/23 1129  BP: 113/61 125/68  108/62  Pulse: 62 70  76  Resp:  18  20  Temp: 98.3 F (36.8 C) 97.6 F (36.4 C)  (!) 97.5 F (36.4 C)   TempSrc:      SpO2: 100% 100% 99% 100%  Weight: 96 kg     Height:        Intake/Output Summary (Last 24 hours) at 09/12/2023 1532 Last data filed at 09/12/2023 1219 Gross per 24 hour  Intake 1417.29 ml  Output 1410 ml  Net 7.29 ml   Filed Weights   09/10/23 0500 09/11/23 0500 09/12/23 0347  Weight: 94.4 kg 93 kg 96 kg    Examination:  General exam: chronically ill appearing Respiratory system: normal rate and wob, scattered rhonchi, rales at base Cardiovascular system: S1 & S2 heard, tachycardic Gastrointestinal system: Abdomen is mildly distended with bandage over midline incision c/d/I, ostomy in place with some liquid stool in bag Central nervous system: confused, Left sided weakness Extremities: warm, trace LE edema Skin: No visible rashes, lesions or ulcers Psychiatry: calm    Data Reviewed: I have personally reviewed following labs and imaging studies  CBC: Recent Labs  Lab 09/08/23 0458 09/09/23 0724 09/10/23 0559 09/11/23 0556 09/12/23 0530  WBC 3.0* 3.1* 4.6 4.1 8.3  HGB 8.1* 7.6* 8.8* 8.4* 7.4*  HCT 25.9* 24.4* 28.0* 27.0* 23.9*  MCV 94.2 94.6 94.3 97.1 96.8  PLT 160 168 214 193 168   Basic Metabolic Panel: Recent Labs  Lab 09/07/23 0351 09/08/23 0458 09/10/23 0559 09/11/23 0556 09/12/23 0500 09/12/23 0530  NA 140 140 141 140  --  141  K 3.5 3.7 4.0 4.1  --  4.6  CL 106 105 104 103  --  105  CO2 25 25 28 26   --  26  GLUCOSE 88 132* 119* 108*  --  150*  BUN 10 11 21 19   --  24*  CREATININE 1.03 0.84 0.86 0.92  --  1.08  CALCIUM  7.9* 8.1* 8.6* 8.4*  --  8.0*  MG 1.7 2.1 1.9 1.9  --  1.9  PHOS 2.5 2.8 3.4 5.2* 4.4  --    GFR: Estimated Creatinine Clearance: 71 mL/min (by C-G formula based on SCr of 1.08 mg/dL). Liver Function Tests: Recent Labs  Lab 09/07/23 0351 09/08/23 0458 09/10/23 0559 09/11/23 0556 09/12/23 0530  AST 15 16 57* 33 20  ALT 17 13 48* 58* 38  ALKPHOS 39 36* 47 51 44  BILITOT 0.5 0.3 0.4 0.6 0.3  PROT 5.8* 5.8*  6.3* 5.6* 5.2*  ALBUMIN  2.4* 2.5* 2.7* 2.5* 2.5*   No results for input(s): LIPASE, AMYLASE in the last 168 hours. No results for input(s): AMMONIA in the last 168 hours. Coagulation Profile: No results for input(s): INR, PROTIME in the last 168 hours.  Cardiac Enzymes: No results for input(s): CKTOTAL, CKMB, CKMBINDEX, TROPONINI in the last 168 hours. BNP (last 3 results) No results for input(s): PROBNP in the last 8760 hours. HbA1C: No results for input(s): HGBA1C in the last 72 hours. CBG: Recent Labs  Lab 09/11/23 2023 09/11/23 2342 09/12/23 0358 09/12/23 0736 09/12/23 1130  GLUCAP 160* 147* 164* 133* 137*   Lipid Profile: Recent Labs    09/10/23 0559  TRIG 103   Thyroid  Function Tests: No results for input(s): TSH, T4TOTAL, FREET4, T3FREE, THYROIDAB in the last  72 hours. Anemia Panel: No results for input(s): VITAMINB12, FOLATE, FERRITIN, TIBC, IRON, RETICCTPCT in the last 72 hours.  Urine analysis:    Component Value Date/Time   COLORURINE AMBER (A) 09/03/2023 0543   APPEARANCEUR CLOUDY (A) 09/03/2023 0543   APPEARANCEUR Clear 08/14/2014 0933   LABSPEC 1.026 09/03/2023 0543   LABSPEC 1.015 08/14/2014 0933   PHURINE 5.0 09/03/2023 0543   GLUCOSEU NEGATIVE 09/03/2023 0543   GLUCOSEU Negative 08/14/2014 0933   HGBUR NEGATIVE 09/03/2023 0543   BILIRUBINUR NEGATIVE 09/03/2023 0543   BILIRUBINUR Negative 08/14/2014 0933   KETONESUR NEGATIVE 09/03/2023 0543   PROTEINUR 30 (A) 09/03/2023 0543   UROBILINOGEN 0.2 06/12/2010 0103   NITRITE NEGATIVE 09/03/2023 0543   LEUKOCYTESUR LARGE (A) 09/03/2023 0543   LEUKOCYTESUR Negative 08/14/2014 0933   Sepsis Labs: @LABRCNTIP (procalcitonin:4,lacticidven:4)  ) Recent Results (from the past 240 hours)  Blood Culture (routine x 2)     Status: None   Collection Time: 09/03/23  3:08 AM   Specimen: BLOOD  Result Value Ref Range Status   Specimen Description BLOOD BLOOD RIGHT  ARM  Final   Special Requests   Final    BOTTLES DRAWN AEROBIC AND ANAEROBIC Blood Culture results may not be optimal due to an inadequate volume of blood received in culture bottles   Culture   Final    NO GROWTH 5 DAYS Performed at [facility], [address] [place], [place] [address]    Report Status 09/08/2023 FINAL  Final  Resp panel by RT-PCR (RSV, Flu A&B, Covid) Anterior Nasal Swab     Status: Abnormal   Collection Time: 09/03/23  3:18 AM   Specimen: Anterior Nasal Swab  Result Value Ref Range Status   SARS Coronavirus 2 by RT PCR NEGATIVE NEGATIVE Final    Comment: (NOTE) SARS-CoV-2 target nucleic acids are NOT DETECTED.  The SARS-CoV-2 RNA is generally detectable in upper respiratory specimens during the acute phase of infection. The lowest concentration of SARS-CoV-2 viral copies this assay can detect is 138 copies/mL. A negative result does not preclude SARS-Cov-2 infection and should not be used as the sole basis for treatment or other patient management decisions. A negative result may occur with  improper specimen collection/handling, submission of specimen other than nasopharyngeal swab, presence of viral mutation(s) within the areas targeted by this assay, and inadequate number of viral copies(<138 copies/mL). A negative result must be combined with clinical observations, patient history, and epidemiological information. The expected result is Negative.  Fact Sheet for Patients:  bloggercourse.com  Fact Sheet for Healthcare Providers:  seriousbroker.it  This test is no t yet approved or cleared by the [place]  FDA and  has been authorized for detection and/or diagnosis of SARS-CoV-2 by FDA under an Emergency Use Authorization (EUA). This EUA will remain  in effect (meaning this test can be used) for the duration of the COVID-19 declaration under Section 564(b)(1) of the Act, 21 U.S.C.section  360bbb-3(b)(1), unless the authorization is terminated  or revoked sooner.       Influenza A by PCR POSITIVE (A) NEGATIVE Final   Influenza B by PCR NEGATIVE NEGATIVE Final    Comment: (NOTE) The Xpert Xpress SARS-CoV-2/FLU/RSV plus assay is intended as an aid in the diagnosis of influenza from Nasopharyngeal swab specimens and should not be used as a sole basis for treatment. Nasal washings and aspirates are unacceptable for Xpert Xpress SARS-CoV-2/FLU/RSV testing.  Fact Sheet for Patients: bloggercourse.com  Fact Sheet for Healthcare Providers: seriousbroker.it  This test  is not yet approved or cleared by the [place]  FDA and has been authorized for detection and/or diagnosis of SARS-CoV-2 by FDA under an Emergency Use Authorization (EUA). This EUA will remain in effect (meaning this test can be used) for the duration of the COVID-19 declaration under Section 564(b)(1) of the Act, 21 U.S.C. section 360bbb-3(b)(1), unless the authorization is terminated or revoked.     Resp Syncytial Virus by PCR NEGATIVE NEGATIVE Final    Comment: (NOTE) Fact Sheet for Patients: bloggercourse.com  Fact Sheet for Healthcare Providers: seriousbroker.it  This test is not yet approved or cleared by the [place]  FDA and has been authorized for detection and/or diagnosis of SARS-CoV-2 by FDA under an Emergency Use Authorization (EUA). This EUA will remain in effect (meaning this test can be used) for the duration of the COVID-19 declaration under Section 564(b)(1) of the Act, 21 U.S.C. section 360bbb-3(b)(1), unless the authorization is terminated or revoked.  Performed at [facility], [address] [place], [place] [address]   Blood Culture (routine x 2)     Status: None   Collection Time: 09/03/23  3:18 AM   Specimen: BLOOD  Result Value Ref Range Status   Specimen  Description BLOOD RIGHT ANTECUBITAL  Final   Special Requests   Final    BOTTLES DRAWN AEROBIC AND ANAEROBIC Blood Culture results may not be optimal due to an inadequate volume of blood received in culture bottles   Culture   Final    NO GROWTH 5 DAYS Performed at [facility], [address] [place], [place] [address]    Report Status 09/08/2023 FINAL  Final  Urine Culture     Status: Abnormal   Collection Time: 09/03/23  5:43 AM   Specimen: Urine, Random  Result Value Ref Range Status   Specimen Description   Final    URINE, RANDOM Performed at [facility], [address] [place], [place] [address]    Special Requests   Final    NONE Reflexed from [id number] Performed at [facility], [address] [place], [place] [address]    Culture 30,000 COLONIES/mL STAPHYLOCOCCUS AUREUS (A)  Final   Report Status 09/05/2023 FINAL  Final   Organism ID, Bacteria STAPHYLOCOCCUS AUREUS (A)  Final      Susceptibility   Staphylococcus aureus - MIC*    CIPROFLOXACIN  >=8 RESISTANT Resistant     GENTAMICIN <=0.5 SENSITIVE Sensitive     NITROFURANTOIN <=16 SENSITIVE Sensitive     OXACILLIN >=4 RESISTANT Resistant     TETRACYCLINE <=1 SENSITIVE Sensitive     VANCOMYCIN  1 SENSITIVE Sensitive     TRIMETH/SULFA >=320 RESISTANT Resistant     RIFAMPIN <=0.5 SENSITIVE Sensitive     Inducible Clindamycin  NEGATIVE Sensitive     LINEZOLID 2 SENSITIVE Sensitive     * 30,000 COLONIES/mL STAPHYLOCOCCUS AUREUS  MRSA Next Gen by PCR, Nasal     Status: Abnormal   Collection Time: 09/03/23 11:00 AM   Specimen: Nasal Mucosa; Nasal Swab  Result Value Ref Range Status   MRSA by PCR Next Gen DETECTED (A) NOT DETECTED Final    Comment: RESULT CALLED TO, READ BACK BY AND VERIFIED WITH: JACOB MOORE @1703  09/03/23 MJU (NOTE) The GeneXpert MRSA Assay (FDA approved for NASAL specimens only), is one component of a comprehensive MRSA colonization surveillance program. It is not intended  to diagnose MRSA infection nor to guide or monitor treatment for MRSA infections. Test performance is not FDA approved in  patients less than 74 years old. Performed at [facility], [address] [place], [place] [address]          Radiology Studies: [facility] Chest [place] 1 View Result Date: 09/11/2023 CLINICAL DATA:  Encounter for central line placement EXAM: PORTABLE CHEST 1 VIEW COMPARISON:  Chest x-ray 09/07/2023.  Abdominal x-ray 09/10/2023. FINDINGS: There is a new right IJ catheter with distal tip projecting over the right atrium. Right upper extremity PICC terminates in the distal SVC. Enteric tube extends below the diaphragm and has been pulled back with distal tip now just beyond the gastroesophageal junction. Heart is mildly enlarged. There central pulmonary vascular congestion. There is left basilar atelectasis. There is no pleural effusion or pneumothorax. No acute fracture identified. IMPRESSION: 1. New right IJ catheter with distal tip projecting over the right atrium. No pneumothorax. 2. Enteric tube has been pulled back with distal tip now just beyond the gastroesophageal junction. Recommend advancing tube 10 cm. 3. Mild cardiomegaly with central pulmonary vascular congestion. 4. Left basilar atelectasis. Electronically Signed   By: Greig Pique M.D.   On: 09/11/2023 15:40        Scheduled Meds:  baclofen   5 mg Oral BID   budesonide  (PULMICORT ) nebulizer solution  0.25 mg Nebulization BID   Chlorhexidine  Gluconate Cloth  6 each Topical Q0600   guaiFENesin   600 mg Oral Q12H   insulin  aspart  0-9 Units Subcutaneous Q4H   ipratropium-albuterol   3 mL Inhalation BID   ketorolac   15 mg Intravenous Q6H   lactulose   20 g Oral Daily   lamoTRIgine   50 mg Oral BID   melatonin  10 mg Oral QHS   mometasone -formoterol   2 puff Inhalation BID   pantoprazole  (PROTONIX ) IV  40 mg Intravenous Q12H   polyethylene glycol  17 g Oral BID   pregabalin   200 mg Oral QHS   sodium  chloride flush  10-40 mL Intracatheter Q12H   tamsulosin   0.4 mg Oral QPM   thiamine  (VITAMIN B1) injection  100 mg Intravenous Q24H   Continuous Infusions:  acetaminophen  1,000 mg (09/12/23 1215)   levETIRAcetam  1,000 mg (09/12/23 0831)   TPN ADULT (ION) 80 mL/hr at 09/12/23 1219   TPN ADULT (ION)       LOS: 9 days     Devaughn KATHEE Ban, MD [facility]   If 7PM-7AM, please contact night-coverage [email or web address] Password [facility] 09/12/2023, 3:32 PM

## 2023-09-12 NOTE — Consult Note (Addendum)
 WOC Nurse ostomy consult note Pt previously had colostomy surgery performed 10/23.  He developed a prolapse and blockage and had colostomy revision surgery yesterday.  He resides at a SNF and has total assistance with pouch changes and emptying prior to admission. He does not watch the pouch change or ask questions and appears to not realize he has an ostomy. Stoma is red and viable, very edematous and shiny, no stool or flatus at this time.  2 1/4 inches and above skin level.  Applied barrier ring and 2 piece pouching system.  5 sets of each supply ordered to the bedside for staff nurse's use. Use supplies: Use Supplies: barrier rings, Gerlean # [phone], wafer Gerlean # 2 pouch Lawson # [id number] WOC team will continue to follow while in the hospital.   Thank-you,  Stephane Fought MSN, RN, CWOCN, [place], CNS [phone]

## 2023-09-12 NOTE — Plan of Care (Signed)

## 2023-09-13 ENCOUNTER — Inpatient Hospital Stay: Payer: Medicare Other

## 2023-09-13 DIAGNOSIS — J9601 Acute respiratory failure with hypoxia: Secondary | ICD-10-CM | POA: Diagnosis not present

## 2023-09-13 LAB — CBC
HCT: 26.7 % — ABNORMAL LOW (ref 39.0–52.0)
Hemoglobin: 8.1 g/dL — ABNORMAL LOW (ref 13.0–17.0)
MCH: 29.9 pg (ref 26.0–34.0)
MCHC: 30.3 g/dL (ref 30.0–36.0)
MCV: 98.5 fL (ref 80.0–100.0)
Platelets: 176 10*3/uL (ref 150–400)
RBC: 2.71 MIL/uL — ABNORMAL LOW (ref 4.22–5.81)
RDW: 14.7 % (ref 11.5–15.5)
WBC: 6.2 10*3/uL (ref 4.0–10.5)
nRBC: 0 % (ref 0.0–0.2)

## 2023-09-13 LAB — COMPREHENSIVE METABOLIC PANEL
ALT: 35 U/L (ref 0–44)
AST: 18 U/L (ref 15–41)
Albumin: 2.6 g/dL — ABNORMAL LOW (ref 3.5–5.0)
Alkaline Phosphatase: 47 U/L (ref 38–126)
Anion gap: 8 (ref 5–15)
BUN: 26 mg/dL — ABNORMAL HIGH (ref 8–23)
CO2: 26 mmol/L (ref 22–32)
Calcium: 8.1 mg/dL — ABNORMAL LOW (ref 8.9–10.3)
Chloride: 109 mmol/L (ref 98–111)
Creatinine, Ser: 1.08 mg/dL (ref 0.61–1.24)
GFR, Estimated: >60 mL/min (ref >60)
Glucose, Bld: 119 mg/dL — ABNORMAL HIGH (ref 70–99)
Potassium: 4.2 mmol/L (ref 3.5–5.1)
Sodium: 143 mmol/L (ref 135–145)
Total Bilirubin: 0.5 mg/dL (ref 0.0–1.2)
Total Protein: 5.8 g/dL — ABNORMAL LOW (ref 6.5–8.1)

## 2023-09-13 LAB — PHOSPHORUS: Phosphorus: 4.2 mg/dL (ref 2.5–4.6)

## 2023-09-13 LAB — GLUCOSE, CAPILLARY
Glucose-Capillary: 107 mg/dL — ABNORMAL HIGH (ref 70–99)
Glucose-Capillary: 113 mg/dL — ABNORMAL HIGH (ref 70–99)
Glucose-Capillary: 120 mg/dL — ABNORMAL HIGH (ref 70–99)
Glucose-Capillary: 120 mg/dL — ABNORMAL HIGH (ref 70–99)
Glucose-Capillary: 145 mg/dL — ABNORMAL HIGH (ref 70–99)
Glucose-Capillary: 89 mg/dL (ref 70–99)
Glucose-Capillary: 95 mg/dL (ref 70–99)

## 2023-09-13 LAB — MAGNESIUM: Magnesium: 2.3 mg/dL (ref 1.7–2.4)

## 2023-09-13 LAB — SURGICAL PATHOLOGY

## 2023-09-13 MED ORDER — GUAIFENESIN 100 MG/5ML PO LIQD
5.0000 mL | ORAL | Status: DC | PRN
  Administered 2023-09-13 – 2023-09-15 (×7): 5 mL via ORAL
  Filled 2023-09-13 (×7): qty 10

## 2023-09-13 MED ORDER — TRAVASOL 10 % IV SOLN
INTRAVENOUS | Status: AC
  Filled 2023-09-13: qty 1056

## 2023-09-13 MED ORDER — HYDROMORPHONE HCL 1 MG/ML IJ SOLN
0.5000 mg | INTRAMUSCULAR | Status: DC | PRN
  Administered 2023-09-13 (×2): 0.5 mg via INTRAVENOUS
  Filled 2023-09-13 (×2): qty 0.5

## 2023-09-13 MED ORDER — INSULIN ASPART 100 UNIT/ML IJ SOLN: 0.0000 [IU] | Freq: Four times a day (QID) | INTRAMUSCULAR | Status: DC

## 2023-09-13 MED ORDER — TRAVASOL 10 % IV SOLN
INTRAVENOUS | Status: DC
  Filled 2023-09-13: qty 1056

## 2023-09-13 MED ORDER — ACETAMINOPHEN 500 MG PO TABS
1000.0000 mg | ORAL_TABLET | Freq: Four times a day (QID) | ORAL | Status: DC
Start: 2023-09-13 — End: 2023-09-15
  Administered 2023-09-13 – 2023-09-15 (×7): 1000 mg via ORAL
  Filled 2023-09-13 (×8): qty 2

## 2023-09-13 NOTE — Hospital Course (Addendum)
 Hospital course / significant events:   HPI: Calvin [name] is a 73 y.o. 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   12/30: admitted to hospitalist sepsis, AKI, (+)Influenza A, likely aspiration pneumonia, acute hypoxic resp fail, SBO. NG placed, surgical consult.  12/31-01/06: relatively stable, remains SBO w/ NG and on TPN, worsening colostomy prolapse. Anemia w/o overt GIB, holding anti-plt/anti-cogs, GI no plans for endoscopic eval at this time 01/07: exlap, LOA, and colostomy revision  01/08: stable.  01/09: d/c NG 01/10: advancing diet as able. Hgb 6.8 - 1 unit PRBC given  01/11: persistent abd pain and cough, imaging concerning for abdominal surgical wound dehiscence deeper layers, going back to OR. Continue w/ cough suppression, await over-read but no obvious CT chest findings, suspect post-viral bronchitis  01/12: to OR early AM for closure of wound dehiscence. Postop stable, NG in place. CT chest question pneumonia but pt reports coughing improved, suspect was mostly reflux, will continue to monitor cough. Postop care per general surgery include clamp NG and remain NPO for now   01/13: NG out, CLD 01/14: FLD, continue advancing diet tolerated    Consultants:  General surgery   Procedures/Surgeries: 09/11/23: exlap, LOA, and colostomy revision  09/16/23: closure of wound dehiscence      ASSESSMENT & PLAN:   Flu A CAP improving, s/p tamiflu  and abx for CAP/aspiration No clearly worsening pneumonia on CT despite cough, see below, no indication for abx for pneumonitis, hold abx   SBO vs ileus History ogilvie syndrome s/p colostomy. s/p ex-lab with lysis of adhesions on 1/7 S/p wound  dehiscence repair 1/12 NG out, FLD General surgery following  Advised pt goal for limiting/reducing opiates d/t goal avoid ileus / promote bowel function, family voices encouragement of this as well, work on reducing opiates --> today 01/14 made dilaudid  for breakthrough, oxy-APA 5-10 mg po q4h prn mod-severe, increased lyrica  from 200 mg at bedtime only to that plus lyrica  100 mg q AM  Cough, multifactorial Postviral/post-infectious component is likely Given CT findings of esophageal fluid/dilation, suspect reflux and/or aspiration pneumonitis is also likely especially since improvement w/ surgery and re-placement of NG Hx COPD but no apparent exacerbation No clearly worsening pneumonia, no indication for abx for pneumonitis, hold abx  Monitor sirs/sepsis parameters  Cough suppressants  PPI  Acute hypoxic respiratory failure Due to above  wean  O2 as able   Anemia continue bid ppi hold asa/heparin  for now can probably resume tomorrow  trend hgb/CBC transfuse for Hgb <7   Colostomy irritation/prolapse gen surg following  mild bleeding from irritate/edematous tissue, ok to monitor    COPD No acute exacerbation S/p 6 days prednisone  Nebs as needed   AKI on ckd 2 resolved Prerenal 2/2 dehydration monitor BMP   Seizure disorder With recent hospitalization for breakthrough seizures continue keppra , home lamictal  and pregabalin    hx CVA  With hemiplegia hold asa given anemia/bleeding risk as above   BPH  cont home flomax    Chronic HFpEF Dehydrated as above judicious IV fluids if needed   Debility Resides at snf PT at LTC     DVT prophylaxis: SCD IV fluids: no continuous IV fluids  Nutrition: FLD advance per general surgery Central lines / invasive devices: NG, central  line, PICC   Code Status: FULL CODE ACP documentation reviewed:  copy of DNR and MOST on file in [facility], DNR was voided dated 2021, MOST notes FULL CODE dated 2023 do DNR removed   TOC  needs: will need to arrange back to LTC when dc appropriate, outpatint PT at facility  Barriers to dispo / significant pending items: advancing diet, surgical clearance prior to dc

## 2023-09-13 NOTE — Progress Notes (Signed)
 Nutrition Follow-up  DOCUMENTATION CODES:   Not applicable  INTERVENTION:   -TPN management per pharmacy -MVI daily via TPN -100 mg thiamine  x 7 days -Pharmacy to monitor electrolyte and replete as needed; pt is at high risk for refeeding syndrome  -RD will follow for diet advancement and add supplements as appropriate  NUTRITION DIAGNOSIS:   Inadequate oral intake related to altered GI function as evidenced by NPO status.  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  12/31- s/p BSE- plan for dysphagia 1 diet with nectar thick liquids when cleared for diet per SLP 1/1- NGT placed (per KUB on 09/05/23 revealed tip of tube in stomach)  1/3- PICC placed, TPN 1/4- NGT fell out and replaced 1/6- NGT pulled again 1/7- s/p Exploratory Laparotomy, lysis of adhesions, revision of loop colostomy into end colostomy with mucous fistula  Reviewed I/O's: +368 ml x 24 hours and -12.8 L since admission  Per general surgery notes, pt currently on NGT clamping trial; potential to d/c NGT and start clear liquids this afternoon if tolerates.   Spoke with pt at bedside, who was pleasant and in good spirits today. Pt reports that he feels better since surgery other than mild abdominal pain. Pt reports some discomfort with NGT and is eager to have this removed.   PTA pt reports fair to good oral intake; he endorses being on a pureed diet with thickened liquids secondary to recent coughing. He reports he  is hungry for oyster stew and eager to eat when able.   Pt remains NPO and receiving TPN for nutritional support. Pt receiving TPN at 80 ml/hr, which provides 1992 kcals and 105 grams protein, meeting 100% of estimated nutritional needs.   Per TOC notes, plan to d/c back to SNF once medically stable.   Wt has been stable since admission.   Medications reviewed and include lactulose , melatonin, protonix , miralax , thiamine , and keppra .   Labs reviewed: CBGS: 102-145 (inpatient orders for glycemic control are 0-9 units insulin  aspart every 6 hours).    NUTRITION - FOCUSED PHYSICAL EXAM:  Flowsheet Row Most Recent Value  Orbital Region No depletion  Upper Arm Region No depletion  Thoracic and Lumbar Region No depletion  Buccal Region No depletion  Temple Region No depletion  Clavicle Bone Region No depletion  Clavicle and Acromion Bone Region No depletion  Scapular Bone Region No depletion  Dorsal Hand No depletion  Patellar Region No depletion  Anterior Thigh Region No depletion  Posterior Calf Region No depletion  Edema (RD Assessment) Mild  Hair Reviewed  Eyes Reviewed  Mouth Reviewed  Skin Reviewed  Nails Reviewed       Diet Order:   Diet Order             Diet NPO time specified Except for: Sips with Meds, Ice Chips  Diet effective now                   EDUCATION NEEDS:   Not appropriate for education at this time  Skin:  Skin Assessment: Skin Integrity Issues: Skin Integrity Issues:: Unstageable Unstageable: rt and lt heels  Last BM:  09/09/23 (10 ml via colostomy)  Height:   Ht Readings from Last 1 Encounters:  09/04/23 5' 11 (1.803 m)    Weight:   Wt Readings from Last 1 Encounters:  09/13/23 96.5 kg    Ideal Body Weight:  78.1 kg  BMI:  Body mass index is 29.67 kg/m.  Estimated Nutritional Needs:   Kcal:  2150-2350  Protein:  105-120 grams  Fluid:  2-2.2 L    Margery ORN, RD, LDN, CDCES Registered Dietitian III Certified  Diabetes Care and Education Specialist If unable to reach this RD, please use RD Inpatient group chat on secure chat between hours of 8am-4 pm daily

## 2023-09-13 NOTE — Plan of Care (Signed)

## 2023-09-13 NOTE — Care Management Important Message (Signed)
 Important Message  Patient Details  Name: Calvin [name] MRN: [id number] Date of Birth: 02/15/1951   Important Message Given:  Yes - Medicare IM     Cristela Blue, CMA 09/13/2023, 11:21 AM

## 2023-09-13 NOTE — TOC Progression Note (Signed)
 Transition of Care [facility]) - Progression Note    Patient Details  Name: Calvin [name] MRN: [id number] Date of Birth: 1950/11/29  Transition of Care [facility]) CM/SW Contact  Mckaylah Bettendorf A Ryana Montecalvo, RN Phone Number: 09/13/2023, 11:48 AM  Clinical Narrative:    Chart reviewed.  Noted that patient Post-op day 2 for exploratory laparotomy, LOA and Colostomy revision.   Noted that will try trail clamping NG tube.  Patient remains on TPN.    Noted that patient is from [facility].  I have spoken with Daril, Admission Coordinator and provided clinical update.  Daril has requested updated clinical information be sent to [facility] once patient is approaching readiness for discharge.    TOC will continue to follow for discharge planning.     Expected Discharge Plan: Long Term Nursing Home Barriers to Discharge: Continued Medical Work up  Expected Discharge Plan and Services                                               Social Determinants of Health (SDOH) Interventions SDOH Screenings   Food Insecurity: No Food Insecurity (09/11/2023)  Housing: Low Risk  (09/11/2023)  Transportation Needs: No Transportation Needs (09/11/2023)  Utilities: Not At Risk (09/11/2023)  Social Connections: Unknown (09/11/2023)  Tobacco Use: Medium Risk (09/03/2023)    Readmission Risk Interventions    08/20/2023   12:32 PM 07/17/2023   12:24 PM 06/30/2022    2:25 PM  Readmission Risk Prevention Plan  Transportation Screening Complete Complete Complete  PCP or Specialist Appt within 3-5 Days  Complete   Social Work Consult for Recovery Care Planning/Counseling  Complete   Palliative Care Screening  Not Applicable   Medication Review [employer]) Complete Complete   PCP or Specialist appointment within 3-5 days of discharge Complete    HRI or Home Care Consult Complete  Complete  SW Recovery Care/Counseling Consult Complete  Complete  Palliative Care Screening Not Applicable  Complete   Skilled Nursing Facility Complete  Complete
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
## 2023-09-13 NOTE — Progress Notes (Signed)
 PROGRESS NOTE    Calvin [name]   FMW:[id number] DOB: 29-Aug-1951  DOA: 09/03/2023 Date of Service: 09/13/23 which is hospital day 10  PCP: Laurine Gladden, MD    Hospital course / significant events:   HPI: Calvin [name] is a 73 y.o. 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   12/30: admitted to hospitalist sepsis, AKI, (+)Influenza A, likely aspiration pneumonia, acute hypoxic resp fail, SBO. NG placed, surgical consult.  12/31-01/06: relatively stable, remains SBO w/ NG and on TPN, worsening colostomy prolapse. Anemia w/o overt GIB, holding anti-plt/anti-cogs, GI no plans for endoscopic eval at this time 01/07: exlap, LOA, and colostomy revision  01/08: stable.  01/09: clamp NG, possible for d/c later today   Consultants:  General surgery   Procedures/Surgeries: 09/11/23: exlap, LOA, and colostomy revision       ASSESSMENT & PLAN:   # Flu A Slowly improving, s/p tamiflu    # SBO, mechanical History ogilvie syndrome s/p colostomy.Several episodes emesis 1/1, ng tube placed. s/p ex-lab with lysis of adhesions on 1/7 - NG tube in place, clamp today 09/13/23 and Check residuals at 11:30 AM and if < 150 ml, can d/c the NG tube and start clear liquids.  - TPN running - advance diet per gen surg  # CAP LLL infiltrate vs edema. Aspiration risk, worth covering for that. cefepme/flagyl >unasyn , Abx started 12/30, now s/p 5 days treatment - monitor off abx   # Sepsis, severe, resolved  By fever, tachycardia, elevated lactic acid. Hemodynamically stable. Lactate normalized - continue fluids while npo   # Acute hypoxic respiratory failure 2/2 flu/cap, currently on 2 liters and  stable, satting well - wean [place] O2 as able    # Delirium, resolved Secondary to above processes.  - mitts to safely continue NG tube if needed    # Anemia Hgb has drifted from baseline of 11-12 to 8s and now relatively stable there though now 7.4 after surgery yesterday. discussed with GI Dr. Onita on 1/1, not candidate for endoluminal eval at the moment, and no convincing evidence gi bleed - continue bid ppi - hold asa/heparin  for now can probably resume shortly - trend hgb/CBC   # Dysphagia Cleared for dysphagia 1 diet with nectar thick when cleared by surgery to eat  - may need re-eval prior to po intake, or at least bedside swallow screen    # Colostomy prolapse - gen surg following    # COPD Stable, S/p 6 days prednisone    # AKI on ckd 2 Prerenal 2/2 dehydration, resolved w/ fluids - continue IVF while npo - monitor BMP   # Lactic acidosis 2/2 above processes, resolved - continue IVF    # Seizure disorder With recent hospitalization for breakthrough seizures - continue keppra , home lamictal  and pregabalin    # hx CVA  With hemiplegia - hold asa given anemia as above   # BPH  - cont home flomax    # HFpEF Dehydrated as above - judicious IV fluids   # Debility Resides at snf - PT consulted, advising SNF     DVT prophylaxis: held given anemia IV fluids: 75 mL/h continuous IV fluids  Nutrition: NPO pending NG removal, getting TPN Central lines / invasive devices: NG  Code  Status: FULL CODE ACP documentation reviewed:  copy of DNR and MOST on file in [facility], DNR was voided dated 2021, MOST notes FULL CODE dated 2023  TOC needs: will need to arrange back to LTC when dc appropriate, outpatint PT at facility  Barriers to dispo / significant pending items: NG in place, surgical clearance prior to dc              Subjective / Brief ROS:  Patient reports abdominal pain Pain controlled.  Denies new weakness. .  Reports no concerns w/  urination/defecation.   Family Communication: none at this time     Objective Findings:  Vitals:   09/13/23 0402 09/13/23 0742 09/13/23 0750 09/13/23 1213  BP: 107/60  (!) 106/57 130/81  Pulse: 62  63 76  Resp: 17  18 18   Temp: 97.8 F (36.6 C)  97.6 F (36.4 C) 98.2 F (36.8 C)  TempSrc:   Axillary Oral  SpO2: 100% 98% 100% 100%  Weight:      Height:        Intake/Output Summary (Last 24 hours) at 09/13/2023 1450 Last data filed at 09/13/2023 0918 Gross per 24 hour  Intake 1945.33 ml  Output 1950 ml  Net -4.67 ml   Filed Weights   09/11/23 0500 09/12/23 0347 09/13/23 0342  Weight: 93 kg 96 kg 96.5 kg    Examination:  Physical Exam Constitutional:      General: He is not in acute distress. Cardiovascular:     Rate and Rhythm: Normal rate and regular rhythm.  Pulmonary:     Effort: No respiratory distress.     Breath sounds: Rhonchi (scattered coarse breath sounds) present.  Abdominal:     General: Bowel sounds are normal.     Palpations: Abdomen is soft.     Tenderness: There is abdominal tenderness (incision site).  Skin:    General: Skin is warm and dry.  Neurological:     Mental Status: He is alert.  Psychiatric:        Mood and Affect: Mood normal.        Behavior: Behavior normal.          Scheduled Medications:   baclofen   5 mg Oral BID   budesonide  (PULMICORT ) nebulizer solution  0.25 mg Nebulization BID   Chlorhexidine  Gluconate Cloth  6 each Topical Q0600   insulin  aspart  0-9 Units Subcutaneous Q6H   ipratropium-albuterol   3 mL Inhalation BID   ketorolac   15 mg Intravenous Q6H   lactulose   20 g Oral Daily   lamoTRIgine   50 mg Oral BID   melatonin  10 mg Oral QHS   mometasone -formoterol   2 puff Inhalation BID   pantoprazole  (PROTONIX ) IV  40 mg Intravenous Q12H   polyethylene glycol  17 g Oral BID   pregabalin   200 mg Oral QHS   sodium chloride  flush  10-40 mL Intracatheter Q12H   tamsulosin   0.4 mg Oral QPM   thiamine  (VITAMIN B1)  injection  100 mg Intravenous Q24H    Continuous Infusions:  levETIRAcetam  1,000 mg (09/13/23 0848)   TPN ADULT (ION) 80 mL/hr at 09/13/23 0256   TPN ADULT (ION)      PRN Medications:  guaiFENesin , HYDROmorphone  (DILAUDID ) injection, LORazepam , metoCLOPramide  (REGLAN ) injection, senna, sodium chloride  flush  Antimicrobials from admission:  Anti-infectives (From admission, onward)    Start     Dose/Rate Route Frequency Ordered Stop   09/11/23 2000  cefoTEtan  (CEFOTAN ) 2 g in sodium chloride  0.9 %  100 mL IVPB        2 g 200 mL/hr over 30 Minutes Intravenous Every 8 hours 09/11/23 1801 09/12/23 1910   09/11/23 0600  cefoTEtan  (CEFOTAN ) 2 g in sodium chloride  0.9 % 100 mL IVPB        2 g 200 mL/hr over 30 Minutes Intravenous On call to O.R. 09/10/23 0858 09/12/23 1910   09/04/23 1200  Ampicillin -Sulbactam (UNASYN ) 3 g in sodium chloride  0.9 % 100 mL IVPB  Status:  Discontinued        3 g 200 mL/hr over 30 Minutes Intravenous Every 6 hours 09/04/23 1052 09/08/23 1537   09/03/23 1800  ceFEPIme  (MAXIPIME ) 2 g in sodium chloride  0.9 % 100 mL IVPB  Status:  Discontinued        2 g 200 mL/hr over 30 Minutes Intravenous Every 12 hours 09/03/23 0812 09/04/23 0903   09/03/23 1500  metroNIDAZOLE  (FLAGYL ) IVPB 500 mg  Status:  Discontinued        500 mg 100 mL/hr over 60 Minutes Intravenous Every 12 hours 09/03/23 0758 09/04/23 0903   09/03/23 1000  oseltamivir  (TAMIFLU ) capsule 30 mg        30 mg Oral 2 times daily 09/03/23 0757 09/08/23 0959   09/03/23 0315  ceFEPIme  (MAXIPIME ) 2 g in sodium chloride  0.9 % 100 mL IVPB        2 g 200 mL/hr over 30 Minutes Intravenous  Once 09/03/23 0304 09/03/23 0441   09/03/23 0315  metroNIDAZOLE  (FLAGYL ) IVPB 500 mg        500 mg 100 mL/hr over 60 Minutes Intravenous  Once 09/03/23 0304 09/03/23 0441   09/03/23 0315  vancomycin  (VANCOCIN ) IVPB 1000 mg/200 mL premix        1,000 mg 200 mL/hr over 60 Minutes Intravenous  Once 09/03/23 0304 09/03/23 0441            Data Reviewed:  I have personally reviewed the following...  CBC: Recent Labs  Lab 09/09/23 0724 09/10/23 0559 09/11/23 0556 09/12/23 0530 09/13/23 0506  WBC 3.1* 4.6 4.1 8.3 6.2  HGB 7.6* 8.8* 8.4* 7.4* 8.1*  HCT 24.4* 28.0* 27.0* 23.9* 26.7*  MCV 94.6 94.3 97.1 96.8 98.5  PLT 168 214 193 168 176   Basic Metabolic Panel: Recent Labs  Lab 09/08/23 0458 09/10/23 0559 09/11/23 0556 09/12/23 0500 09/12/23 0530 09/13/23 0506  NA 140 141 140  --  141 143  K 3.7 4.0 4.1  --  4.6 4.2  CL 105 104 103  --  105 109  CO2 25 28 26   --  26 26  GLUCOSE 132* 119* 108*  --  150* 119*  BUN 11 21 19   --  24* 26*  CREATININE 0.84 0.86 0.92  --  1.08 1.08  CALCIUM  8.1* 8.6* 8.4*  --  8.0* 8.1*  MG 2.1 1.9 1.9  --  1.9 2.3  PHOS 2.8 3.4 5.2* 4.4  --  4.2   GFR: Estimated Creatinine Clearance: 71.1 mL/min (by C-G formula based on SCr of 1.08 mg/dL). Liver Function Tests: Recent Labs  Lab 09/08/23 0458 09/10/23 0559 09/11/23 0556 09/12/23 0530 09/13/23 0506  AST 16 57* 33 20 18  ALT 13 48* 58* 38 35  ALKPHOS 36* 47 51 44 47  BILITOT 0.3 0.4 0.6 0.3 0.5  PROT 5.8* 6.3* 5.6* 5.2* 5.8*  ALBUMIN  2.5* 2.7* 2.5* 2.5* 2.6*   No results for input(s): LIPASE, AMYLASE in the last 168 hours. No results for  input(s): AMMONIA in the last 168 hours. Coagulation Profile: No results for input(s): INR, PROTIME in the last 168 hours. Cardiac Enzymes: No results for input(s): CKTOTAL, CKMB, CKMBINDEX, TROPONINI in the last 168 hours. BNP (last 3 results) No results for input(s): PROBNP in the last 8760 hours. HbA1C: No results for input(s): HGBA1C in the last 72 hours. CBG: Recent Labs  Lab 09/12/23 1959 09/13/23 0007 09/13/23 0359 09/13/23 0755 09/13/23 1207  GLUCAP 102* 145* 120* 107* 95   Lipid Profile: No results for input(s): CHOL, HDL, LDLCALC, TRIG, CHOLHDL, LDLDIRECT in the last 72 hours. Thyroid  Function Tests: No  results for input(s): TSH, T4TOTAL, FREET4, T3FREE, THYROIDAB in the last 72 hours. Anemia Panel: No results for input(s): VITAMINB12, FOLATE, FERRITIN, TIBC, IRON, RETICCTPCT in the last 72 hours. Most Recent Urinalysis On File:     Component Value Date/Time   COLORURINE AMBER (A) 09/03/2023 0543   APPEARANCEUR CLOUDY (A) 09/03/2023 0543   APPEARANCEUR Clear 08/14/2014 0933   LABSPEC 1.026 09/03/2023 0543   LABSPEC 1.015 08/14/2014 0933   PHURINE 5.0 09/03/2023 0543   GLUCOSEU NEGATIVE 09/03/2023 0543   GLUCOSEU Negative 08/14/2014 0933   HGBUR NEGATIVE 09/03/2023 0543   BILIRUBINUR NEGATIVE 09/03/2023 0543   BILIRUBINUR Negative 08/14/2014 0933   KETONESUR NEGATIVE 09/03/2023 0543   PROTEINUR 30 (A) 09/03/2023 0543   UROBILINOGEN 0.2 06/12/2010 0103   NITRITE NEGATIVE 09/03/2023 0543   LEUKOCYTESUR LARGE (A) 09/03/2023 0543   LEUKOCYTESUR Negative 08/14/2014 0933   Sepsis Labs: @LABRCNTIP (procalcitonin:4,lacticidven:4) Microbiology: No results found for this or any previous visit (from the past 240 hours).     Radiology Studies last 3 days: DG Chest Port 1 View Result Date: 09/11/2023 CLINICAL DATA:  Encounter for central line placement EXAM: PORTABLE CHEST 1 VIEW COMPARISON:  Chest x-ray 09/07/2023.  Abdominal x-ray 09/10/2023. FINDINGS: There is a new right IJ catheter with distal tip projecting over the right atrium. Right upper extremity PICC terminates in the distal SVC. Enteric tube extends below the diaphragm and has been pulled back with distal tip now just beyond the gastroesophageal junction. Heart is mildly enlarged. There central pulmonary vascular congestion. There is left basilar atelectasis. There is no pleural effusion or pneumothorax. No acute fracture identified. IMPRESSION: 1. New right IJ catheter with distal tip projecting over the right atrium. No pneumothorax. 2. Enteric tube has been pulled back with distal tip now just beyond the  gastroesophageal junction. Recommend advancing tube 10 cm. 3. Mild cardiomegaly with central pulmonary vascular congestion. 4. Left basilar atelectasis. Electronically Signed   By: Greig Pique M.D.   On: 09/11/2023 15:40   DG Abd 2 Views Result Date: 09/10/2023 CLINICAL DATA:  Abdominal pain.  Small-bowel obstruction. EXAM: ABDOMEN - 2 VIEW COMPARISON:  09/08/2023 and older exams.  CT, 09/06/2023. FINDINGS: There is dilated proximal small bowel projecting in the central abdomen, similar to the prior exams consistent with a partial small bowel obstruction. No free air. Nasogastric tube well positioned, tip in the distal stomach. Small amount of residual contrast noted in a mostly decompressed left colon. IMPRESSION: 1. Persistent partial small bowel obstruction. Electronically Signed   By: Alm Parkins M.D.   On: 09/10/2023 09:07       Mikhia Dusek, DO [facility] 09/13/2023, 2:50 PM    Dictation software may have been used to generate the above note. Typos may occur and escape review in typed/dictated notes. Please contact Dr Marsa directly for clarity if needed.  Staff may message me via secure  chat in Epic  but this may not receive an immediate response,  please page me for urgent matters!  If 7PM-7AM, please contact night coverage [email or web address]

## 2023-09-13 NOTE — Evaluation (Signed)
 Physical Therapy Evaluation Patient Details Name: Calvin [name] MRN: [id number] DOB: March 06, 1951 Today's Date: 09/13/2023  History of Present Illness  Pt is a 73 y.o. male presenting to hospital from nursing facility for abdominal pain, N/V, fever, and hypoxia.  Pt admitted with severe sepsis, acute hypoxic respiratory failure, SBO, AKI on CKD stage II, colostomy prolapse.  (+) Flu.  Hospital course significant for exploratory laparotomy, colostomy revision and LOA (1/7).  PMH includes h/o CVA with L sided deficitis, COPD, colostomy, seizure disorder, chronic combined HFrEF and HFpEF, CKD, htn, colon CA s/p transverse colectomy and colostomy, L partial hip replacement, throat sx (d/t CA), L CTR.  Clinical Impression  Patient initially declining participation with session, but does allow therapist to assist with some simple ROM, repositioning, education with encouragement.  Endorses persistent abdominal pain, FACES 8/10; meds received prior to session. Patient alert and oriented to basic information (self, location, general situation); follows simple commands, but often requires redirection to task.  Generally weak and deconditioned throughout; baseline L hemi-paresis noted.  Per RN, pressure areas to bilat heels; prevalon boots adjusted to protect. Currently requiring max/total assist +2 for all repositioning and bed mobility; unable/unwilling to provide much active assist due to abdominal pain.  Refused attempts at further bed mobility or progression towards edge of bed; will continue to assess/progress as medically appropriate and patient agreeable. Would benefit from skilled PT to address above deficits and promote optimal return to PLOF.; recommend post-acute PT follow up as indicated by interdisciplinary care team.            If plan is discharge home, recommend the following: Two people to help with walking and/or transfers;Two people to help with bathing/dressing/bathroom;Assistance with  cooking/housework;Assist for transportation;[name]   Can travel by private vehicle   No    Equipment Recommendations    Recommendations for Other Services       Functional Status Assessment Patient has had a recent decline in their functional status and/or demonstrates limited ability to make significant improvements in function in a reasonable and predictable amount of time     Precautions / Restrictions Precautions Precautions: Fall Precaution Comments: NPO; LUQ colostomy Restrictions Weight Bearing Restrictions Per Provider Order: No      Mobility  Bed Mobility Overal bed mobility: Needs Assistance             General bed mobility comments: all bed mobility and repositioning, max/total assist +2; limited willingness/ability to actively assist due to abdominal pain    Transfers                   General transfer comment: patient refused due to abdominal pain    Ambulation/Gait               General Gait Details: patient refused due to abdominal pain  Stairs            Wheelchair Mobility     Tilt Bed    Modified Rankin (Stroke Patients Only)       Balance                                             Pertinent Vitals/Pain Pain Assessment Pain Assessment: Faces Faces Pain Scale: Hurts whole lot Pain Descriptors / Indicators: Discomfort, Aching, Grimacing, Guarding Pain Intervention(s): Limited activity within patient's tolerance, Monitored during  session, Repositioned    Home Living Family/patient expects to be discharged to:: Skilled nursing facility                   Additional Comments: Pt reports residing at [facility] (LTC) for past 6 months.    Prior Function Prior Level of Function : Needs assist             Mobility Comments: Pt reports being able to get OOB and transfer to w/c (holds onto bar for transfer) with assist but uses lift (described as sit to stand  lift) to get back to bed; pt reports participating in physical therapy (using bike; working on standing); pt reports he can push manual w/c with UE's on his own in his room but requires assist for longer distances ADLs Comments: Requires assist for bathing/dressing/ADL's     Extremity/Trunk Assessment   Upper Extremity Assessment RUE Deficits / Details: strong R hand grip; at least 4/5 R elbow flexion/extension LUE Deficits / Details: h/o hemi-paresis; good L hand grip strength; at least 4-/5 elbow flexion/extension; does endorse generalized paresthesia throughout L UE    Lower Extremity Assessment RLE Deficits / Details: R LE grossly 4-/5, able to complete partial SLR LLE Deficits / Details: L LE grossly 3-/5; does endorse generalized paresthesia throughout L LE       Communication      Cognition Arousal: Alert Behavior During Therapy: WFL for tasks assessed/performed Overall Cognitive Status: No family/caregiver present to determine baseline cognitive functioning                                 General Comments: Oriented to person, place and general situation; follows simple commands, does require encouragement throughout session        General Comments      Exercises Other Exercises Other Exercises: Repositioned in bed for improved midline, progressive increase in upright; adjusted prevalon boots for adequate fit/protection and neutral LE position Other Exercises: Educated patient in assisted coughing techniques/abdominal bracing while coughing (hugging pillow); returns demonstration, but endorses limited impact.   Assessment/Plan    PT Assessment Patient needs continued PT services  PT Problem List Decreased strength;Decreased activity tolerance;Decreased balance;Decreased mobility;Pain;Decreased range of motion;Decreased coordination;Decreased cognition;Decreased knowledge of use of DME;Decreased knowledge of precautions;Decreased safety  awareness;Cardiopulmonary status limiting activity;Decreased skin integrity       PT Treatment Interventions DME instruction;Functional mobility training;Therapeutic activities;Therapeutic exercise;Balance training;Patient/family education;Wheelchair mobility training    PT Goals (Current goals can be found in the Care Plan section)  Acute Rehab PT Goals Patient Stated Goal: to make the pain better, to be able to work on sitting/standing in therapy again PT Goal Formulation: With patient Time For Goal Achievement: 09/27/23 Potential to Achieve Goals: Fair    Frequency Min 1X/week     Co-evaluation               AM-PAC PT 6 Clicks Mobility  Outcome Measure Help needed turning from your back to your side while in a flat bed without using bedrails?: Total Help needed moving from lying on your back to sitting on the side of a flat bed without using bedrails?: Total Help needed moving to and from a bed to a chair (including a wheelchair)?: Total Help needed standing up from a chair using your arms (e.g., wheelchair or bedside chair)?: Total Help needed to walk in hospital room?: Total Help needed climbing 3-5 steps with a railing? :  Total 6 Click Score: 6    End of Session Equipment Utilized During Treatment: Oxygen Activity Tolerance: Patient limited by pain Patient left: in bed;with call bell/phone within reach;with bed alarm set Nurse Communication: Mobility status PT Visit Diagnosis: Other abnormalities of gait and mobility (R26.89);Muscle weakness (generalized) (M62.81);Pain    Time: 8677-8657 PT Time Calculation (min) (ACUTE ONLY): 20 min   Charges:   PT Evaluation $PT Re-evaluation: 1 Re-eval   PT General Charges $$ ACUTE PT VISIT: 1 Visit         Belal Scallon H. Delores, PT, DPT, NCS 09/13/23, 3:23 PM [phone]

## 2023-09-13 NOTE — Progress Notes (Signed)
 Called to bedside d/t RN concern for pain  Pt states still sharp severe pain in area of incision and in band like distribution across cental abdomen worse w/ cough. He states is not really different in character from what he has felt since out of surgery, just isn't going away and feels more painful. No nausea. No lightheadedness.    BP 119/80 (BP Location: Left Arm)   Pulse 85   Temp 98 F (36.7 C) (Oral)   Resp 18   Ht 5' 11 (1.803 m)   Wt 96.5 kg   SpO2 100%   BMI 29.67 kg/m  ABd: distended, no rebound, no rigidity. Dark brown liquid output in colostomy. BS present  KUB - personally viewed, await radiology over-read but there is gas distension without obvious obstructive pattern or volvulus, NG has been removed  A/P: Abdominal pain, post-op, likely typical of his surgery but suspect ileus - Dr Desiderio aware and has also viewed images, no acute concerns  - Pain control - added antispasmodics, concern that increasing opiates further may predispose to ileus/obstruction, already suspect some ileus now  - Await radiology over-read - will sign out to [facility] night coverage for ongoing monitoring and follow up radiology report if this comes back overnight    Critical care time 30 min

## 2023-09-13 NOTE — Progress Notes (Signed)
 09/13/2023  Subjective: Patient is 2 Days Post-Op s/p exlap, LOA, and colostomy revision.  No acute events. Reports abdominal pain/soreness.  No NG output recorded  Vital signs: Temp:  [97.5 F (36.4 C)-98.1 F (36.7 C)] 97.8 F (36.6 C) (01/09 0402) Pulse Rate:  [62-80] 62 (01/09 0402) Resp:  [17-20] 17 (01/09 0402) BP: (107-125)/(60-68) 107/60 (01/09 0402) SpO2:  [93 %-100 %] 100 % (01/09 0402) Weight:  [96.5 kg] 96.5 kg (01/09 0342)   Intake/Output: 01/08 0701 - 01/09 0700 In: 2068 [P.O.:60; I.V.:1508; IV Piggyback:500] Out: 1700 [Urine:1700] Last BM Date : 09/09/23  Physical Exam: Constitutional: No acute distress Abdomen:  soft, non-distended, appropriately tender at the midline incision.  LUQ ostomy with mucosal edema but pink/viable and with liquid stool output in bag.  NG in place with thinner fluid in cannister.  Labs:  Recent Labs    09/12/23 0530 09/13/23 0506  WBC 8.3 6.2  HGB 7.4* 8.1*  HCT 23.9* 26.7*  PLT 168 176   Recent Labs    09/12/23 0530 09/13/23 0506  NA 141 143  K 4.6 4.2  CL 105 109  CO2 26 26  GLUCOSE 150* 119*  BUN 24* 26*  CREATININE 1.08 1.08  CALCIUM  8.0* 8.1*   No results for input(s): LABPROT, INR in the last 72 hours.  Imaging: No results found.  Assessment/Plan: This is a 73 y.o. male s/p exlap, LOA, and colostomy revision.  --Given bowel functio now and thinner NG output, will do clamping trial today.  Check residuals at 11:30 AM and if < 150 ml, can d/c the NG tube and start clear liquids. --Continue TPN --OOB with assistance.  Will reorder PT consult   Aloysius Sheree Plant, MD [facility]

## 2023-09-13 NOTE — Anesthesia Postprocedure Evaluation (Signed)
 Anesthesia Post Note  Patient: Coren Sagan Teo  Procedure(s) Performed: EXPLORATORY LAPAROTOMY (Abdomen) LYSIS OF ADHESION (Abdomen) COLOSTOMY REVISION (Abdomen)  Patient location during evaluation: PACU Anesthesia Type: General Level of consciousness: awake and alert Pain management: pain level controlled Vital Signs Assessment: post-procedure vital signs reviewed and stable Respiratory status: spontaneous breathing, nonlabored ventilation, respiratory function stable and patient connected to nasal cannula oxygen Cardiovascular status: blood pressure returned to baseline and stable Postop Assessment: no apparent nausea or vomiting Anesthetic complications: no   No notable events documented.   Last Vitals:  Vitals:   09/13/23 0750 09/13/23 1213  BP: (!) 106/57 130/81  Pulse: 63 76  Resp: 18 18  Temp: 36.4 C 36.8 C  SpO2: 100% 100%    Last Pain:  Vitals:   09/13/23 1254  TempSrc:   PainSc: 10-Worst pain ever                 Lendia LITTIE Mae

## 2023-09-14 DIAGNOSIS — J9601 Acute respiratory failure with hypoxia: Secondary | ICD-10-CM | POA: Diagnosis not present

## 2023-09-14 LAB — GLUCOSE, CAPILLARY
Glucose-Capillary: 115 mg/dL — ABNORMAL HIGH (ref 70–99)
Glucose-Capillary: 116 mg/dL — ABNORMAL HIGH (ref 70–99)
Glucose-Capillary: 123 mg/dL — ABNORMAL HIGH (ref 70–99)
Glucose-Capillary: 132 mg/dL — ABNORMAL HIGH (ref 70–99)
Glucose-Capillary: 144 mg/dL — ABNORMAL HIGH (ref 70–99)
Glucose-Capillary: 93 mg/dL (ref 70–99)

## 2023-09-14 LAB — CBC
HCT: 22.6 % — ABNORMAL LOW (ref 39.0–52.0)
Hemoglobin: 6.8 g/dL — ABNORMAL LOW (ref 13.0–17.0)
MCH: 29.3 pg (ref 26.0–34.0)
MCHC: 30.1 g/dL (ref 30.0–36.0)
MCV: 97.4 fL (ref 80.0–100.0)
Platelets: 164 10*3/uL (ref 150–400)
RBC: 2.32 MIL/uL — ABNORMAL LOW (ref 4.22–5.81)
RDW: 15.4 % (ref 11.5–15.5)
WBC: 9.2 10*3/uL (ref 4.0–10.5)
nRBC: 0 % (ref 0.0–0.2)

## 2023-09-14 LAB — PREPARE RBC (CROSSMATCH)

## 2023-09-14 MED ORDER — SODIUM CHLORIDE 0.9% IV SOLUTION
Freq: Once | INTRAVENOUS | Status: AC
Start: 2023-09-14 — End: 2023-09-14

## 2023-09-14 MED ORDER — OXYCODONE HCL 5 MG PO TABS
5.0000 mg | ORAL_TABLET | ORAL | Status: DC | PRN
  Administered 2023-09-14 – 2023-09-15 (×3): 10 mg via ORAL
  Administered 2023-09-16 (×2): 5 mg via ORAL
  Administered 2023-09-17: 10 mg via ORAL
  Administered 2023-09-17 – 2023-09-18 (×2): 5 mg via ORAL
  Filled 2023-09-14: qty 2
  Filled 2023-09-14 (×2): qty 1
  Filled 2023-09-14 (×4): qty 2
  Filled 2023-09-14 (×2): qty 1

## 2023-09-14 MED ORDER — INSULIN ASPART 100 UNIT/ML IJ SOLN
0.0000 [IU] | Freq: Three times a day (TID) | INTRAMUSCULAR | Status: DC
  Administered 2023-09-14 – 2023-09-15 (×2): 1 [IU] via SUBCUTANEOUS
  Filled 2023-09-14 (×2): qty 1

## 2023-09-14 MED ORDER — TRAVASOL 10 % IV SOLN
INTRAVENOUS | Status: AC
  Filled 2023-09-14: qty 1056

## 2023-09-14 MED ORDER — HYDROMORPHONE HCL 1 MG/ML IJ SOLN
0.5000 mg | INTRAMUSCULAR | Status: DC | PRN
  Administered 2023-09-14 – 2023-09-18 (×12): 0.5 mg via INTRAVENOUS
  Filled 2023-09-14 (×12): qty 0.5

## 2023-09-14 MED ORDER — OXYCODONE HCL 5 MG PO TABS
5.0000 mg | ORAL_TABLET | ORAL | Status: DC | PRN
Start: 2023-09-14 — End: 2023-09-14

## 2023-09-14 NOTE — Progress Notes (Signed)
 PHARMACY - TOTAL PARENTERAL NUTRITION CONSULT NOTE   Indication: Small bowel obstruction  Patient Measurements: Height: 5' 11 (180.3 cm) Weight: 96.5 kg (212 lb 11.9 oz) IBW/kg (Calculated) : 75.3 TPN AdjBW (KG): 93.1 Body mass index is 29.67 kg/m. Usual Weight: 93.1 kg  Assessment:  73 y/o male with PMH significant for seizure disorder, HFmrEF, CKD stage II, HTN, CVA, and colon cancer s/p transverse colectomy and colostomy. Patient presenting with abdominal pain and found to have small bowel obstruction. Pharmacy has been consulted to initiate TPN.  Glucose / Insulin : BG at goal with sSSI q6h. 0u insulin  given last 24h. Electrolytes: No electrolyte disturbances Renal: Scr ~ 1 Hepatic: Overall normal, no apparent liver dysfunction Intake / Output; MIVF: Net negative for the admission GI Imaging: 01/02 CT Abd:  Recurrent mid small bowel obstruction without clear demonstrated etiology, likely due to adhesions. Ill-defined transition point noted in the right mid abdomen GI Surgeries / Procedures:  Surgery to manage conservatively for now but will tentatively plan for exploration, lysis of adhesions, and ostomy revision early next week. -surgery planned 09/11/23 for lysis of adhesions,etc  Central access: PICC TPN start date: 09/07/2023  Nutritional Goals: Due to unforeseen natural disaster and resultant supply constraints, TPN may be customized to target nutritional needs based on available premix products.   RD Assessment: Estimated Needs Total Energy Estimated Needs: 2150-2350 Total Protein Estimated Needs: 105-120 grams Total Fluid Estimated Needs: 2-2.2 L  Current Nutrition:  Full liquids  09/13/23: Per surgery note, plan to clamp NG-T and check residuals, if < 150 cc then NG-T to be pulled and start CLD 09/14/23: Advance to FLD. Continue TPN. Plan to start weaning TPN once on soft diet  Plan:  --Continue TPN at goal rate of 80 mL/hr at 1800 (will provide 1992 kcals, 105 g  protein, Fluid per 24 hrs: 1920 ml) --Electrolytes in TPN: Na 15mEq/L, K 51mEq/L, Ca 5 mEq/L, Mg 35mEq/L, and Phos 15mmol/L. Cl:Ac 1:1 --Add standard MVI and trace elements to TPN --Modify to Sensitive q8h SSI and adjust as needed  --Monitor TPN labs daily until stable, then on Mon/Thurs  Thank you for involving pharmacy in this patient's care.   Marolyn KATHEE Mare 09/14/2023 8:32 AM

## 2023-09-14 NOTE — Progress Notes (Signed)
 [facility] SURGICAL PROGRESS NOTE  Hospital Day(s): 11.   Post op day(s): 3 Days Post-Op.   Interval History:  Patient seen and examined No acute events or new complaints overnight.  Patient reports he is doing okay; abdominal soreness No fever, chills, nausea, emesis  Continues without leukocytosis; WBC 9.2K Hgb to 6.8 Passed NGT clamping trial yesterday (01/09) Colostomy with gas and stool in bag; 525 ccs recorded  Vital signs in last 24 hours: [min-max] current  Temp:  [97.4 F (36.3 C)-98.7 F (37.1 C)] 98.1 F (36.7 C) (01/10 0759) Pulse Rate:  [72-105] 72 (01/10 0759) Resp:  [15-19] 15 (01/10 0759) BP: (87-151)/(48-81) 90/60 (01/10 0800) SpO2:  [98 %-100 %] 100 % (01/10 0759)     Height: 5' 11 (180.3 cm) Weight: 96.5 kg BMI (Calculated): 29.68   Intake/Output last 2 shifts:  01/09 0701 - 01/10 0700 In: 2564.9 [P.O.:360; I.V.:2004.9; IV Piggyback:200] Out: 1475 [Urine:950; Stool:525]   Physical Exam:  Constitutional: alert, cooperative and no distress  Respiratory: breathing non-labored at rest Cardiovascular: regular rate and sinus rhythm  Gastrointestinal: soft, incisional soreness, and non-distended. No rebound/guarding. 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; colostomy edematous expectedly, lots of gas and stool in bag  Genitourinary: Foley in place  Integumentary: Laparotomy is CDI with staples, no erythema   Labs:     Latest Ref Rng & Units 09/14/2023    6:35 AM 09/13/2023    5:06 AM 09/12/2023    5:30 AM  CBC  WBC 4.0 - 10.5 K/uL 9.2  6.2  8.3   Hemoglobin 13.0 - 17.0 g/dL 6.8  8.1  7.4   Hematocrit 39.0 - 52.0 % 22.6  26.7  23.9   Platelets 150 - 400 K/uL 164  176  168       Latest Ref Rng & Units 09/13/2023    5:06 AM 09/12/2023    5:30 AM 09/11/2023    5:56 AM  CMP  Glucose 70 - 99 mg/dL 880  849  891   BUN 8 - 23 mg/dL 26  24  19    Creatinine 0.61 - 1.24 mg/dL 8.91  8.91  9.07   Sodium 135 - 145 mmol/L 143  141  140   Potassium 3.5 -  5.1 mmol/L 4.2  4.6  4.1   Chloride 98 - 111 mmol/L 109  105  103   CO2 22 - 32 mmol/L 26  26  26    Calcium  8.9 - 10.3 mg/dL 8.1  8.0  8.4   Total Protein 6.5 - 8.1 g/dL 5.8  5.2  5.6   Total Bilirubin 0.0 - 1.2 mg/dL 0.5  0.3  0.6   Alkaline Phos 38 - 126 U/L 47  44  51   AST 15 - 41 U/L 18  20  33   ALT 0 - 44 U/L 35  38  58      Imaging studies: No new pertinent imaging studies   Assessment/Plan: 73 y.o. male 3 Days Post-Op s/p exploratory laparotomy, LOA, and colostomy revision   - Will advance to FLD  - Continue TPN; monitor electrolytes - may be able to start weaning once on soft diet  - Monitor H&H; transfuse as needed - likely dilutional and in combination with recent surgery  - Monitor abdominal examination - Monitor colostomy output; record - Pain control prn; antiemetics prn - Further management per primary service; we will follow    All of the above findings and recommendations were discussed with the  patient, and the medical team, and all of patient's questions were answered to his expressed satisfaction.  -- Arthea Platt, PA-C [facility] 09/14/2023, 8:24 AM M-F: 7am - 4pm

## 2023-09-14 NOTE — Plan of Care (Signed)

## 2023-09-14 NOTE — Progress Notes (Signed)
 PROGRESS NOTE    Calvin [name]   FMW:[id number] DOB: [birth year]  DOA: 09/03/2023 Date of Service: 09/14/23 which is hospital day 11  PCP: Calvin Gladden, MD    Hospital course / significant events:   HPI: Calvin [name] is a 73 y.o. 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   12/30: admitted to hospitalist sepsis, AKI, (+)Influenza A, likely aspiration pneumonia, acute hypoxic resp fail, SBO. NG placed, surgical consult.  12/31-01/06: relatively stable, remains SBO w/ NG and on TPN, worsening colostomy prolapse. Anemia w/o overt GIB, holding anti-plt/anti-cogs, GI no plans for endoscopic eval at this time 01/07: exlap, LOA, and colostomy revision  01/08: stable.  01/09: d/c NG 01/10: advancing diet as able, remains on TPN. Hgb 6.8 - 1 unit PRBC given    Consultants:  General surgery   Procedures/Surgeries: 09/11/23: exlap, LOA, and colostomy revision       ASSESSMENT & PLAN:   # Flu A improving, s/p tamiflu    # SBO, mechanical History ogilvie syndrome s/p colostomy.Several episodes emesis 1/1, ng tube placed. s/p ex-lab with lysis of adhesions on 1/7 - TPN running - advance diet per gen surg  # CAP LLL infiltrate vs edema. Aspiration risk, worth covering for that. cefepme/flagyl >unasyn , Abx started 12/30, now s/p 5 days treatment - monitor off abx, no concerns    # Sepsis, severe, resolved  By fever, tachycardia, elevated lactic acid. Hemodynamically stable. Lactate normalized - continue fluids and TPN pending improved po intake    # Acute hypoxic respiratory failure 2/2 flu/cap, currently on 2 liters and stable, satting well - wean [name] as able    # Delirium,  resolved Secondary to above processes.  - monitor   # Anemia Hgb has drifted from baseline of 11-12 to 8s and now relatively stable there though now 7.4 after surgery yesterday. discussed with GI Dr. Onita on 1/1, not candidate for endoluminal eval at the moment, and no convincing evidence gi bleed - continue bid ppi - hold asa/heparin  for now can probably resume shortly - trend hgb/CBC - transfuse for Hgb <7, Hgb today 6.8 ordering 1 unit PRBC   # Dysphagia Cleared for dysphagia 1 diet with nectar thick when cleared by surgery to eat  - may need re-eval prior to po intake   # Colostomy prolapse - gen surg following  - mild bleeding from irritate/edematous tissue, ok to monitor    # COPD Stable, S/p 6 days prednisone    # AKI on ckd 2 Prerenal 2/2 dehydration, resolved w/ fluid - monitor BMP   # Lactic acidosis 2/2 above processes, resolved - recheck as needed   # Seizure disorder With recent hospitalization for breakthrough seizures - continue keppra , home lamictal  and pregabalin    # hx CVA  With hemiplegia - hold asa given anemia/bleeding as above   # BPH  - cont home flomax    # HFpEF Dehydrated as above - judicious IV fluids   # Debility Resides at snf - PT consulted, advising SNF     DVT prophylaxis: held given anemia IV fluids: 75 mL/h continuous IV fluids  Nutrition: NPO pending NG removal, getting TPN Central lines / invasive devices: NG  Code Status: FULL CODE ACP documentation reviewed:  copy of  DNR and MOST on file in [facility], DNR was voided dated 2021, MOST notes FULL CODE dated 2023  TOC needs: will need to arrange back to LTC when dc appropriate, outpatint PT at facility  Barriers to dispo / significant pending items: NG in place, surgical clearance prior to dc              Subjective / Brief ROS:  Patient reports abdominal pain Pain controlled.  Denies new weakness. .  Reports no concerns w/ urination/defecation.   Family  Communication: none at this time     Objective Findings:  Vitals:   09/14/23 0758 09/14/23 0759 09/14/23 0800 09/14/23 1100  BP:  (!) 87/48 90/60 (!) 92/56  Pulse:  72 76 81  Resp:  15  15  Temp:  98.1 F (36.7 C)  97.9 F (36.6 C)  TempSrc:    Oral  SpO2: 100% 100% 100% 99%  Weight:      Height:        Intake/Output Summary (Last 24 hours) at 09/14/2023 1507 Last data filed at 09/14/2023 0900 Gross per 24 hour  Intake 1955.54 ml  Output 1225 ml  Net 730.54 ml   Filed Weights   09/11/23 0500 09/12/23 0347 09/13/23 0342  Weight: 93 kg 96 kg 96.5 kg    Examination:  Physical Exam Constitutional:      General: He is not in acute distress. Cardiovascular:     Rate and Rhythm: Normal rate and regular rhythm.  Pulmonary:     Effort: No respiratory distress.     Breath sounds: Rhonchi (scattered coarse breath sounds) present.  Abdominal:     General: Bowel sounds are normal.     Palpations: Abdomen is soft.     Tenderness: There is abdominal tenderness (incision site, less severe compared to yesterday).     Comments: Ostomy prolapse w/ some irritated friable appearing tissue, scant red thin serous bloody liquid in ostomy bag no bloody stool   Skin:    General: Skin is warm and dry.  Neurological:     Mental Status: He is alert.  Psychiatric:        Mood and Affect: Mood normal.        Behavior: Behavior normal.          Scheduled Medications:   sodium chloride    Intravenous Once   acetaminophen   1,000 mg Oral Q6H   baclofen   5 mg Oral BID   budesonide  (PULMICORT ) nebulizer solution  0.25 mg Nebulization BID   Chlorhexidine  Gluconate Cloth  6 each Topical Q0600   insulin  aspart  0-9 Units Subcutaneous Q8H   ipratropium-albuterol   3 mL Inhalation BID   lactulose   20 g Oral Daily   lamoTRIgine   50 mg Oral BID   melatonin  10 mg Oral QHS   mometasone -formoterol   2 puff Inhalation BID   pantoprazole  (PROTONIX ) IV  40 mg Intravenous Q12H   polyethylene  glycol  17 g Oral BID   pregabalin   200 mg Oral QHS   sodium chloride  flush  10-40 mL Intracatheter Q12H   tamsulosin   0.4 mg Oral QPM   thiamine  (VITAMIN B1) injection  100 mg Intravenous Q24H    Continuous Infusions:  levETIRAcetam  1,000 mg (09/14/23 0856)   TPN ADULT (ION) 80 mL/hr at 09/14/23 0338   TPN ADULT (ION)      PRN Medications:  guaiFENesin , HYDROmorphone  (DILAUDID ) injection, LORazepam , metoCLOPramide  (REGLAN ) injection, oxyCODONE , senna, sodium chloride  flush  Antimicrobials from admission:  Anti-infectives (From  admission, onward)    Start     Dose/Rate Route Frequency Ordered Stop   09/11/23 2000  cefoTEtan  (CEFOTAN ) 2 g in sodium chloride  0.9 % 100 mL IVPB        2 g 200 mL/hr over 30 Minutes Intravenous Every 8 hours 09/11/23 1801 09/12/23 1910   09/11/23 0600  cefoTEtan  (CEFOTAN ) 2 g in sodium chloride  0.9 % 100 mL IVPB        2 g 200 mL/hr over 30 Minutes Intravenous On call to O.R. 09/10/23 0858 09/12/23 1910   09/04/23 1200  Ampicillin -Sulbactam (UNASYN ) 3 g in sodium chloride  0.9 % 100 mL IVPB  Status:  Discontinued        3 g 200 mL/hr over 30 Minutes Intravenous Every 6 hours 09/04/23 1052 09/08/23 1537   09/03/23 1800  ceFEPIme  (MAXIPIME ) 2 g in sodium chloride  0.9 % 100 mL IVPB  Status:  Discontinued        2 g 200 mL/hr over 30 Minutes Intravenous Every 12 hours 09/03/23 0812 09/04/23 0903   09/03/23 1500  metroNIDAZOLE  (FLAGYL ) IVPB 500 mg  Status:  Discontinued        500 mg 100 mL/hr over 60 Minutes Intravenous Every 12 hours 09/03/23 0758 09/04/23 0903   09/03/23 1000  oseltamivir  (TAMIFLU ) capsule 30 mg        30 mg Oral 2 times daily 09/03/23 0757 09/08/23 0959   09/03/23 0315  ceFEPIme  (MAXIPIME ) 2 g in sodium chloride  0.9 % 100 mL IVPB        2 g 200 mL/hr over 30 Minutes Intravenous  Once 09/03/23 0304 09/03/23 0441   09/03/23 0315  metroNIDAZOLE  (FLAGYL ) IVPB 500 mg        500 mg 100 mL/hr over 60 Minutes Intravenous  Once 09/03/23 0304  09/03/23 0441   09/03/23 0315  vancomycin  (VANCOCIN ) IVPB 1000 mg/200 mL premix        1,000 mg 200 mL/hr over 60 Minutes Intravenous  Once 09/03/23 0304 09/03/23 0441           Data Reviewed:  I have personally reviewed the following...  CBC: Recent Labs  Lab 09/10/23 0559 09/11/23 0556 09/12/23 0530 09/13/23 0506 09/14/23 0635  WBC 4.6 4.1 8.3 6.2 9.2  HGB 8.8* 8.4* 7.4* 8.1* 6.8*  HCT 28.0* 27.0* 23.9* 26.7* 22.6*  MCV 94.3 97.1 96.8 98.5 97.4  PLT 214 193 168 176 164   Basic Metabolic Panel: Recent Labs  Lab 09/08/23 0458 09/10/23 0559 09/11/23 0556 09/12/23 0500 09/12/23 0530 09/13/23 0506  NA 140 141 140  --  141 143  K 3.7 4.0 4.1  --  4.6 4.2  CL 105 104 103  --  105 109  CO2 25 28 26   --  26 26  GLUCOSE 132* 119* 108*  --  150* 119*  BUN 11 21 19   --  24* 26*  CREATININE 0.84 0.86 0.92  --  1.08 1.08  CALCIUM  8.1* 8.6* 8.4*  --  8.0* 8.1*  MG 2.1 1.9 1.9  --  1.9 2.3  PHOS 2.8 3.4 5.2* 4.4  --  4.2   GFR: Estimated Creatinine Clearance: 71.1 mL/min (by C-G formula based on SCr of 1.08 mg/dL). Liver Function Tests: Recent Labs  Lab 09/08/23 0458 09/10/23 0559 09/11/23 0556 09/12/23 0530 09/13/23 0506  AST 16 57* 33 20 18  ALT 13 48* 58* 38 35  ALKPHOS 36* 47 51 44 47  BILITOT 0.3 0.4 0.6 0.3 0.5  PROT 5.8* 6.3* 5.6* 5.2* 5.8*  ALBUMIN  2.5* 2.7* 2.5* 2.5* 2.6*   No results for input(s): LIPASE, AMYLASE in the last 168 hours. No results for input(s): AMMONIA in the last 168 hours. Coagulation Profile: No results for input(s): INR, PROTIME in the last 168 hours. Cardiac Enzymes: No results for input(s): CKTOTAL, CKMB, CKMBINDEX, TROPONINI in the last 168 hours. BNP (last 3 results) No results for input(s): PROBNP in the last 8760 hours. HbA1C: No results for input(s): HGBA1C in the last 72 hours. CBG: Recent Labs  Lab 09/13/23 2341 09/14/23 0108 09/14/23 0516 09/14/23 0758 09/14/23 1205  GLUCAP 120* 132*  123* 116* 115*   Lipid Profile: No results for input(s): CHOL, HDL, LDLCALC, TRIG, CHOLHDL, LDLDIRECT in the last 72 hours. Thyroid  Function Tests: No results for input(s): TSH, T4TOTAL, FREET4, T3FREE, THYROIDAB in the last 72 hours. Anemia Panel: No results for input(s): VITAMINB12, FOLATE, FERRITIN, TIBC, IRON, RETICCTPCT in the last 72 hours. Most Recent Urinalysis On File:     Component Value Date/Time   COLORURINE AMBER (A) 09/03/2023 0543   APPEARANCEUR CLOUDY (A) 09/03/2023 0543   APPEARANCEUR Clear 08/14/2014 0933   LABSPEC 1.026 09/03/2023 0543   LABSPEC 1.015 08/14/2014 0933   PHURINE 5.0 09/03/2023 0543   GLUCOSEU NEGATIVE 09/03/2023 0543   GLUCOSEU Negative 08/14/2014 0933   HGBUR NEGATIVE 09/03/2023 0543   BILIRUBINUR NEGATIVE 09/03/2023 0543   BILIRUBINUR Negative 08/14/2014 0933   KETONESUR NEGATIVE 09/03/2023 0543   PROTEINUR 30 (A) 09/03/2023 0543   UROBILINOGEN 0.2 06/12/2010 0103   NITRITE NEGATIVE 09/03/2023 0543   LEUKOCYTESUR LARGE (A) 09/03/2023 0543   LEUKOCYTESUR Negative 08/14/2014 0933   Sepsis Labs: @LABRCNTIP (procalcitonin:4,lacticidven:4) Microbiology: No results found for this or any previous visit (from the past 240 hours).     Radiology Studies last 3 days: DG Abd 2 Views Result Date: 09/13/2023 CLINICAL DATA:  Abdominal pain, bleeding EXAM: ABDOMEN - 2 VIEW COMPARISON:  09/10/2023 FINDINGS: Central line extends to the mid right atrium. The gastric tube is been removed. Skin staples over the right lower abdomen. Stomach and small bowel decompressed. Residual contrast in the nondistended colon. Left lower quadrant ostomy device. Left hip arthroplasty components partially visualized. IMPRESSION: Nonobstructive bowel gas pattern. Electronically Signed   By: JONETTA Faes M.D.   On: 09/13/2023 19:25   DG Chest Port 1 View Result Date: 09/11/2023 CLINICAL DATA:  Encounter for central line placement EXAM: PORTABLE CHEST  1 VIEW COMPARISON:  Chest x-ray 09/07/2023.  Abdominal x-ray 09/10/2023. FINDINGS: There is a new right IJ catheter with distal tip projecting over the right atrium. Right upper extremity PICC terminates in the distal SVC. Enteric tube extends below the diaphragm and has been pulled back with distal tip now just beyond the gastroesophageal junction. Heart is mildly enlarged. There central pulmonary vascular congestion. There is left basilar atelectasis. There is no pleural effusion or pneumothorax. No acute fracture identified. IMPRESSION: 1. New right IJ catheter with distal tip projecting over the right atrium. No pneumothorax. 2. Enteric tube has been pulled back with distal tip now just beyond the gastroesophageal junction. Recommend advancing tube 10 cm. 3. Mild cardiomegaly with central pulmonary vascular congestion. 4. Left basilar atelectasis. Electronically Signed   By: Greig Pique M.D.   On: 09/11/2023 15:40       Quinlan Vollmer, DO [facility] 09/14/2023, 3:07 PM    Dictation software may have been used to generate the above note. Typos may occur and escape review in typed/dictated notes. Please  contact Dr Marsa directly for clarity if needed.  Staff may message me via secure chat in Epic  but this may not receive an immediate response,  please page me for urgent matters!  If 7PM-7AM, please contact night coverage [email or web address]

## 2023-09-14 NOTE — Plan of Care (Addendum)
 Patient is alert and oriented X 2. He has a colostomy in his right side of abdomen, it looks red, edematous and mild bleeding. Colostomy bag changed. MD made aware. No any new order received.dressing done on his abdomen as order. It looks clean,dry and intact. Blood transfusion started and is going on at the rate of 120 ml/hrs, no any allergic reaction seen. TPN is going on as order.  He has been getting a breathing treatment and prn cough medicine. Plan of care ongoing.   Problem: Education: Goal: Knowledge of General Education information will improve Description: Including pain rating scale, medication(s)/side effects and non-pharmacologic comfort measures Outcome: Progressing   Problem: Health Behavior/Discharge Planning: Goal: Ability to manage health-related needs will improve Outcome: Progressing   Problem: Clinical Measurements: Goal: Ability to maintain clinical measurements within normal limits will improve Outcome: Progressing Goal: Will remain free from infection Outcome: Progressing Goal: Diagnostic test results will improve Outcome: Progressing Goal: Respiratory complications will improve Outcome: Progressing Goal: Cardiovascular complication will be avoided Outcome: Progressing   Problem: Activity: Goal: Risk for activity intolerance will decrease Outcome: Progressing   Problem: Nutrition: Goal: Adequate nutrition will be maintained Outcome: Progressing   Problem: Coping: Goal: Level of anxiety will decrease Outcome: Progressing   Problem: Elimination: Goal: Will not experience complications related to bowel motility Outcome: Progressing Goal: Will not experience complications related to urinary retention Outcome: Progressing   Problem: Pain Management: Goal: General experience of comfort will improve Outcome: Progressing   Problem: Safety: Goal: Ability to remain free from injury will improve Outcome: Progressing   Problem: Skin Integrity: Goal: Risk  for impaired skin integrity will decrease Outcome: Progressing   Problem: Education: Goal: Knowledge of disease or condition will improve Outcome: Progressing Goal: Knowledge of the prescribed therapeutic regimen will improve Outcome: Progressing Goal: Individualized Educational Video(s) Outcome: Progressing   Problem: Activity: Goal: Ability to tolerate increased activity will improve Outcome: Progressing Goal: Will verbalize the importance of balancing activity with adequate rest periods Outcome: Progressing   Problem: Respiratory: Goal: Ability to maintain a clear airway will improve Outcome: Progressing Goal: Levels of oxygenation will improve Outcome: Progressing Goal: Ability to maintain adequate ventilation will improve Outcome: Progressing

## 2023-09-15 ENCOUNTER — Encounter: Admission: EM | Disposition: E | Payer: Self-pay | Source: Home / Self Care | Attending: Obstetrics and Gynecology

## 2023-09-15 ENCOUNTER — Inpatient Hospital Stay: Payer: Medicare Other

## 2023-09-15 ENCOUNTER — Inpatient Hospital Stay: Payer: Medicare Other | Admitting: Certified Registered"

## 2023-09-15 ENCOUNTER — Other Ambulatory Visit: Payer: Self-pay

## 2023-09-15 DIAGNOSIS — J9601 Acute respiratory failure with hypoxia: Secondary | ICD-10-CM | POA: Diagnosis not present

## 2023-09-15 HISTORY — PX: LAPAROTOMY: SHX154

## 2023-09-15 LAB — CBC
HCT: 31.2 % — ABNORMAL LOW (ref 39.0–52.0)
Hemoglobin: 9.6 g/dL — ABNORMAL LOW (ref 13.0–17.0)
MCH: 29.3 pg (ref 26.0–34.0)
MCHC: 30.8 g/dL (ref 30.0–36.0)
MCV: 95.1 fL (ref 80.0–100.0)
Platelets: 192 10*3/uL (ref 150–400)
RBC: 3.28 MIL/uL — ABNORMAL LOW (ref 4.22–5.81)
RDW: 17 % — ABNORMAL HIGH (ref 11.5–15.5)
WBC: 11.3 10*3/uL — ABNORMAL HIGH (ref 4.0–10.5)
nRBC: 0 % (ref 0.0–0.2)

## 2023-09-15 LAB — BPAM RBC
Blood Product Expiration Date: 202501272359
ISSUE DATE / TIME: 202501101816
Unit Type and Rh: 7300

## 2023-09-15 LAB — TYPE AND SCREEN
ABO/RH(D): B POS
Antibody Screen: NEGATIVE
Unit division: 0

## 2023-09-15 LAB — GLUCOSE, CAPILLARY
Glucose-Capillary: 130 mg/dL — ABNORMAL HIGH (ref 70–99)
Glucose-Capillary: 123 mg/dL — ABNORMAL HIGH (ref 70–99)
Glucose-Capillary: 94 mg/dL (ref 70–99)
Glucose-Capillary: 144 mg/dL — ABNORMAL HIGH (ref 70–99)

## 2023-09-15 LAB — BASIC METABOLIC PANEL
Anion gap: 6 (ref 5–15)
BUN: 25 mg/dL — ABNORMAL HIGH (ref 8–23)
CO2: 25 mmol/L (ref 22–32)
Calcium: 8.4 mg/dL — ABNORMAL LOW (ref 8.9–10.3)
Chloride: 106 mmol/L (ref 98–111)
Creatinine, Ser: 0.87 mg/dL (ref 0.61–1.24)
GFR, Estimated: >60 mL/min (ref >60)
Glucose, Bld: 139 mg/dL — ABNORMAL HIGH (ref 70–99)
Potassium: 5.4 mmol/L — ABNORMAL HIGH (ref 3.5–5.1)
Sodium: 137 mmol/L (ref 135–145)

## 2023-09-15 SURGERY — LAPAROTOMY, EXPLORATORY
Anesthesia: General

## 2023-09-15 MED ORDER — OXYCODONE HCL 5 MG/5ML PO SOLN: 5.0000 mg | Freq: Once | ORAL | Status: DC | PRN

## 2023-09-15 MED ORDER — DEXTROMETHORPHAN POLISTIREX ER 30 MG/5ML PO SUER
60.0000 mg | Freq: Two times a day (BID) | ORAL | Status: DC
  Administered 2023-09-15 – 2023-09-16 (×3): 60 mg via ORAL
  Filled 2023-09-15 (×4): qty 10

## 2023-09-15 MED ORDER — BENZONATATE 100 MG PO CAPS
200.0000 mg | ORAL_CAPSULE | Freq: Three times a day (TID) | ORAL | Status: DC
  Administered 2023-09-15 – 2023-09-20 (×12): 200 mg via ORAL
  Filled 2023-09-15 (×11): qty 2

## 2023-09-15 MED ORDER — CEFAZOLIN SODIUM-DEXTROSE 2-3 GM-%(50ML) IV SOLR
INTRAVENOUS | Status: DC | PRN
  Administered 2023-09-15: 2 g via INTRAVENOUS

## 2023-09-15 MED ORDER — FENTANYL CITRATE (PF) 100 MCG/2ML IJ SOLN
INTRAMUSCULAR | Status: AC
  Filled 2023-09-15: qty 2

## 2023-09-15 MED ORDER — PROPOFOL 10 MG/ML IV BOLUS
INTRAVENOUS | Status: AC
  Filled 2023-09-15: qty 20

## 2023-09-15 MED ORDER — IPRATROPIUM-ALBUTEROL 0.5-2.5 (3) MG/3ML IN SOLN
3.0000 mL | Freq: Three times a day (TID) | RESPIRATORY_TRACT | Status: DC
  Administered 2023-09-15 – 2023-09-17 (×5): 3 mL via RESPIRATORY_TRACT
  Filled 2023-09-15 (×5): qty 3

## 2023-09-15 MED ORDER — ACETAMINOPHEN 10 MG/ML IV SOLN
INTRAVENOUS | Status: AC
  Filled 2023-09-15: qty 100

## 2023-09-15 MED ORDER — CEFAZOLIN SODIUM 1 G IJ SOLR
INTRAMUSCULAR | Status: AC
  Filled 2023-09-15: qty 40

## 2023-09-15 MED ORDER — SODIUM CHLORIDE (PF) 0.9 % IJ SOLN
INTRAMUSCULAR | Status: DC | PRN
  Administered 2023-09-15: 100 mL

## 2023-09-15 MED ORDER — OXYCODONE HCL 5 MG PO TABS: 5.0000 mg | ORAL_TABLET | Freq: Once | ORAL | Status: DC | PRN

## 2023-09-15 MED ORDER — ACETAMINOPHEN 325 MG PO TABS
650.0000 mg | ORAL_TABLET | Freq: Four times a day (QID) | ORAL | Status: DC
  Administered 2023-09-16 – 2023-09-18 (×9): 650 mg via ORAL
  Filled 2023-09-15 (×9): qty 2

## 2023-09-15 MED ORDER — HYDROCODONE BIT-HOMATROP MBR 5-1.5 MG/5ML PO SOLN
5.0000 mL | ORAL | Status: DC | PRN
  Administered 2023-09-16 – 2023-09-21 (×6): 5 mL via ORAL
  Filled 2023-09-15 (×6): qty 5

## 2023-09-15 MED ORDER — DEXAMETHASONE SODIUM PHOSPHATE 10 MG/ML IJ SOLN
INTRAMUSCULAR | Status: DC | PRN
  Administered 2023-09-15: 10 mg via INTRAVENOUS

## 2023-09-15 MED ORDER — ONDANSETRON HCL 4 MG/2ML IJ SOLN
INTRAMUSCULAR | Status: DC | PRN
  Administered 2023-09-15: 4 mg via INTRAVENOUS

## 2023-09-15 MED ORDER — SODIUM CHLORIDE 0.9 % IV SOLN: INTRAVENOUS | Status: DC | PRN

## 2023-09-15 MED ORDER — ROCURONIUM BROMIDE 100 MG/10ML IV SOLN
INTRAVENOUS | Status: DC | PRN
  Administered 2023-09-15: 70 mg via INTRAVENOUS

## 2023-09-15 MED ORDER — DEXMEDETOMIDINE HCL IN NACL 80 MCG/20ML IV SOLN
INTRAVENOUS | Status: AC
  Filled 2023-09-15: qty 20

## 2023-09-15 MED ORDER — IPRATROPIUM-ALBUTEROL 0.5-2.5 (3) MG/3ML IN SOLN
3.0000 mL | Freq: Three times a day (TID) | RESPIRATORY_TRACT | Status: DC
  Administered 2023-09-15: 3 mL via RESPIRATORY_TRACT
  Filled 2023-09-15: qty 3

## 2023-09-15 MED ORDER — PROPOFOL 10 MG/ML IV BOLUS
INTRAVENOUS | Status: DC | PRN
  Administered 2023-09-15: 120 mg via INTRAVENOUS

## 2023-09-15 MED ORDER — SUCCINYLCHOLINE CHLORIDE 200 MG/10ML IV SOSY
PREFILLED_SYRINGE | INTRAVENOUS | Status: AC
  Filled 2023-09-15: qty 10

## 2023-09-15 MED ORDER — ACETAMINOPHEN 10 MG/ML IV SOLN
INTRAVENOUS | Status: DC | PRN
  Administered 2023-09-15: 1000 mg via INTRAVENOUS

## 2023-09-15 MED ORDER — IOHEXOL 300 MG/ML  SOLN
100.0000 mL | Freq: Once | INTRAMUSCULAR | Status: AC | PRN
  Administered 2023-09-15: 100 mL via INTRAVENOUS

## 2023-09-15 MED ORDER — HYDROMORPHONE HCL 1 MG/ML IJ SOLN
INTRAMUSCULAR | Status: AC
  Filled 2023-09-15: qty 1

## 2023-09-15 MED ORDER — FENTANYL CITRATE (PF) 100 MCG/2ML IJ SOLN
INTRAMUSCULAR | Status: DC | PRN
  Administered 2023-09-15: 100 ug via INTRAVENOUS

## 2023-09-15 MED ORDER — LEVETIRACETAM 500 MG PO TABS
1000.0000 mg | ORAL_TABLET | Freq: Two times a day (BID) | ORAL | Status: DC
  Administered 2023-09-16 – 2023-09-20 (×11): 1000 mg via ORAL
  Filled 2023-09-15 (×11): qty 2

## 2023-09-15 MED ORDER — HYDROMORPHONE HCL 1 MG/ML IJ SOLN
1.0000 mg | INTRAMUSCULAR | Status: DC | PRN
  Administered 2023-09-15: 1 mg via INTRAVENOUS

## 2023-09-15 MED ORDER — SUGAMMADEX SODIUM 200 MG/2ML IV SOLN
INTRAVENOUS | Status: DC | PRN
  Administered 2023-09-15: 200 mg via INTRAVENOUS

## 2023-09-15 MED ORDER — LIDOCAINE HCL (PF) 2 % IJ SOLN
INTRAMUSCULAR | Status: AC
  Filled 2023-09-15: qty 5

## 2023-09-15 MED ORDER — 0.9 % SODIUM CHLORIDE (POUR BTL) OPTIME
TOPICAL | Status: DC | PRN
  Administered 2023-09-15: 1000 mL

## 2023-09-15 MED ORDER — FENTANYL CITRATE (PF) 100 MCG/2ML IJ SOLN
25.0000 ug | INTRAMUSCULAR | Status: DC | PRN
  Administered 2023-09-15 (×2): 50 ug via INTRAVENOUS

## 2023-09-15 MED ORDER — LIDOCAINE HCL (CARDIAC) PF 100 MG/5ML IV SOSY
PREFILLED_SYRINGE | INTRAVENOUS | Status: DC | PRN
  Administered 2023-09-15: 100 mg via INTRAVENOUS

## 2023-09-15 MED ORDER — IPRATROPIUM-ALBUTEROL 0.5-2.5 (3) MG/3ML IN SOLN
RESPIRATORY_TRACT | Status: AC
  Filled 2023-09-15: qty 3

## 2023-09-15 MED ORDER — DEXMEDETOMIDINE HCL IN NACL 80 MCG/20ML IV SOLN
INTRAVENOUS | Status: DC | PRN
  Administered 2023-09-15 (×2): 8 ug via INTRAVENOUS

## 2023-09-15 MED ORDER — PHENYLEPHRINE 80 MCG/ML (10ML) SYRINGE FOR IV PUSH (FOR BLOOD PRESSURE SUPPORT)
PREFILLED_SYRINGE | INTRAVENOUS | Status: DC | PRN
  Administered 2023-09-15 (×2): 160 ug via INTRAVENOUS

## 2023-09-15 SURGICAL SUPPLY — 44 items
CATH ROBINSON RED A/P 16FR (CATHETERS) IMPLANT
CHLORAPREP W/TINT 26 (MISCELLANEOUS) ×1 IMPLANT
DRAPE LAPAROTOMY 100X77 ABD (DRAPES) ×1 IMPLANT
DRSG OPSITE POSTOP 4X10 (GAUZE/BANDAGES/DRESSINGS) IMPLANT
DRSG OPSITE POSTOP 4X8 (GAUZE/BANDAGES/DRESSINGS) IMPLANT
ELECT BLADE 6.5 EXT (BLADE) IMPLANT
ELECT CAUTERY BLADE 6.4 (BLADE) ×1 IMPLANT
ELECT REM PT RETURN 9FT ADLT (ELECTROSURGICAL) ×1
ELECTRODE REM PT RTRN 9FT ADLT (ELECTROSURGICAL) ×1 IMPLANT
GAUZE 4X4 16PLY ~~LOC~~+RFID DBL (SPONGE) IMPLANT
GAUZE SPONGE 4X4 12PLY STRL (GAUZE/BANDAGES/DRESSINGS) IMPLANT
GLOVE BIOGEL PI IND STRL 7.0 (GLOVE) ×1 IMPLANT
GLOVE SURG SYN 6.5 ES PF (GLOVE) ×1 IMPLANT
GLOVE SURG SYN 6.5 PF PI (GLOVE) ×1 IMPLANT
GOWN STRL REUS W/ TWL LRG LVL3 (GOWN DISPOSABLE) ×2 IMPLANT
KIT OSTOMY DRAINABLE 2.75 STR (WOUND CARE) IMPLANT
KIT TURNOVER KIT A (KITS) ×1 IMPLANT
LABEL OR SOLS (LABEL) ×1 IMPLANT
MANIFOLD NEPTUNE II (INSTRUMENTS) ×1 IMPLANT
NDL HYPO 22X1.5 SAFETY MO (MISCELLANEOUS) IMPLANT
NEEDLE HYPO 22X1.5 SAFETY MO (MISCELLANEOUS) IMPLANT
NS IRRIG 1000ML POUR BTL (IV SOLUTION) ×1 IMPLANT
PACK BASIN MAJOR ARMC (MISCELLANEOUS) ×1 IMPLANT
PACK COLON CLEAN CLOSURE (MISCELLANEOUS) IMPLANT
RELOAD PROXIMATE 75MM BLUE (ENDOMECHANICALS) IMPLANT
RELOAD STAPLE 75 3.8 BLU REG (ENDOMECHANICALS) IMPLANT
SLEEVE SCD COMPRESS KNEE MED (STOCKING) IMPLANT
SOL PREP PVP 2OZ (MISCELLANEOUS) ×2
SOLUTION PREP PVP 2OZ (MISCELLANEOUS) IMPLANT
SPONGE T-LAP 18X18 ~~LOC~~+RFID (SPONGE) IMPLANT
STAPLER PROXIMATE 75MM BLUE (STAPLE) IMPLANT
STAPLER SKIN PROX 35W (STAPLE) ×1 IMPLANT
SUT ETHILON 2 0 FS 18 (SUTURE) IMPLANT
SUT PDS AB 1 TP1 54 (SUTURE) ×1 IMPLANT
SUT PROLENE 2 TP 1 (SUTURE) IMPLANT
SUT SILK 2-0 18XBRD TIE 12 (SUTURE) IMPLANT
SUT SILK 3 0 SH CR/8 (SUTURE) IMPLANT
SUT SILK 3-0 18XBRD TIE 12 (SUTURE) IMPLANT
SUT VIC AB 3-0 SH 27X BRD (SUTURE) IMPLANT
SYR 20ML LL LF (SYRINGE) IMPLANT
TRAP FLUID SMOKE EVACUATOR (MISCELLANEOUS) ×1 IMPLANT
TRAY FOLEY MTR SLVR 16FR STAT (SET/KITS/TRAYS/PACK) ×1 IMPLANT
TRAY FOLEY SLVR 16FR LF STAT (SET/KITS/TRAYS/PACK) IMPLANT
WATER STERILE IRR 500ML POUR (IV SOLUTION) ×1 IMPLANT

## 2023-09-15 NOTE — Progress Notes (Addendum)
 PROGRESS NOTE    Calvin [name]   FMW:[id number] DOB: 06/22/1951  DOA: 09/03/2023 Date of Service: 09/15/23 which is hospital day 12  PCP: Laurine Gladden, MD    Hospital course / significant events:   HPI: Calvin [name] is a 73 y.o. 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   12/30: admitted to hospitalist sepsis, AKI, (+)Influenza A, likely aspiration pneumonia, acute hypoxic resp fail, SBO. NG placed, surgical consult.  12/31-01/06: relatively stable, remains SBO w/ NG and on TPN, worsening colostomy prolapse. Anemia w/o overt GIB, holding anti-plt/anti-cogs, GI no plans for endoscopic eval at this time 01/07: exlap, LOA, and colostomy revision  01/08: stable.  01/09: d/c NG 01/10: advancing diet as able, remains on TPN. Hgb 6.8 - 1 unit PRBC given  01/11: persistent abd pain and cough, imaging concerning for abdominal surgical wound dehiscence, going back to OR. Continue w/ cough suppression, await over-read but no obvious CT chest findings, suspect post-viral bronchitis    Consultants:  General surgery   Procedures/Surgeries: 09/11/23: exlap, LOA, and colostomy revision       ASSESSMENT & PLAN:   # Flu A # CAP improving, s/p tamiflu  Postviral cough has been an issue - will obtain imaging today to eval for other etiology, and add cough suppressants to avoid issues w/ his abdominal incision/prolapse  LLL infiltrate vs edema. Aspiration risk, worth covering for that. cefepme/flagyl >unasyn , Abx started 12/30, now s/p 5 days treatment - monitor off abx, no concerns  - CT chest today pending official read but no obvious infiltrate, suspect postviral cough and/or reflux -->  continue cough suppressants and PPI  # SBO, mechanical History ogilvie syndrome s/p colostomy.Several episodes emesis 1/1, ng tube placed. s/p ex-lab with lysis of adhesions on 1/7 - TPN running, weaning off  - advance diet per gen surg - more distended today, persistent pain, imaging w/ CT    #possible surgical dehiscence -to OR today to reinforce  - continue cough suppressants   # Acute hypoxic respiratory failure 2/2 flu/cap, currently on 2 liters and stable, satting well - wean [place] O2 as able   # Anemia - continue bid ppi - hold asa/heparin  for now can probably resume shortly - trend hgb/CBC - transfuse for Hgb <7   # Dysphagia Cleared for dysphagia 1 diet with nectar thick when cleared by surgery to eat  - may need re-eval prior to po intake   # Colostomy prolapse - gen surg following  - mild bleeding from irritate/edematous tissue, ok to monitor    # COPD No acute exacerbation S/p 6 days prednisone  - Nebs as ordered   # AKI on ckd 2 Prerenal 2/2 dehydration, resolved w/ fluid - monitor BMP   # Lactic acidosis 2/2 above processes, resolved - recheck as needed   # Seizure disorder With recent hospitalization for breakthrough seizures - continue keppra , home lamictal  and pregabalin    # hx CVA  With hemiplegia - hold asa given anemia/bleeding as above   # BPH  - cont home flomax    # HFpEF Dehydrated as above - judicious IV fluids if needed   # Debility Resides at snf - PT at LTC     DVT prophylaxis: held given anemia IV fluids: 75 mL/h  continuous IV fluids  Nutrition: NPO pending NG removal, getting TPN Central lines / invasive devices: NG  Code Status: FULL CODE ACP documentation reviewed:  copy of DNR and MOST on file in [facility], DNR was voided dated 2021, MOST notes FULL CODE dated 2023  TOC needs: will need to arrange back to LTC when dc appropriate, outpatint PT at facility  Barriers to dispo / significant pending items: NG in place,  surgical clearance prior to dc              Subjective / Brief ROS:  Patient reports abdominal pain about same as yesterday Cough stil bothersome and is greatest trigger for pain  Denies new weakness. .   Family Communication: none at this time     Objective Findings:  Vitals:   09/15/23 0742 09/15/23 0825 09/15/23 1207 09/15/23 1543  BP:  120/74 121/68   Pulse:  92 98   Resp:  20 20   Temp:  97.7 F (36.5 C) 98.7 F (37.1 C)   TempSrc:  Oral Oral   SpO2: 98% 100% 99% 97%  Weight:      Height:        Intake/Output Summary (Last 24 hours) at 09/15/2023 1639 Last data filed at 09/15/2023 1517 Gross per 24 hour  Intake 1874.58 ml  Output 4525 ml  Net -2650.42 ml   Filed Weights   09/11/23 0500 09/12/23 0347 09/13/23 0342  Weight: 93 kg 96 kg 96.5 kg    Examination:  Physical Exam Constitutional:      General: He is not in acute distress. Cardiovascular:     Rate and Rhythm: Normal rate and regular rhythm.  Pulmonary:     Effort: No respiratory distress.     Breath sounds: Rhonchi (scattered coarse breath sounds) present.  Abdominal:     General: A surgical scar is present. Bowel sounds are normal. There is distension.     Palpations: Abdomen is soft.     Tenderness: There is abdominal tenderness (incision site, less severe compared to yesterday).     Comments: Ostomy prolapse w/ some irritated friable appearing tissue, scant red thin serous bloody liquid in ostomy bag no bloody stool   Skin:    General: Skin is warm and dry.  Neurological:     Mental Status: He is alert.  Psychiatric:        Mood and Affect: Mood normal.        Behavior: Behavior normal.          Scheduled Medications:   acetaminophen   650 mg Oral Q6H   baclofen   5 mg Oral BID   benzonatate   200 mg Oral TID   budesonide  (PULMICORT ) nebulizer solution  0.25 mg Nebulization BID   Chlorhexidine  Gluconate Cloth  6 each Topical Q0600   dextromethorphan   60 mg Oral BID    insulin  aspart  0-9 Units Subcutaneous Q8H   ipratropium-albuterol   3 mL Inhalation TID   lactulose   20 g Oral Daily   lamoTRIgine   50 mg Oral BID   levETIRAcetam   1,000 mg Oral BID   melatonin  10 mg Oral QHS   mometasone -formoterol   2 puff Inhalation BID   pantoprazole  (PROTONIX ) IV  40 mg Intravenous Q12H   polyethylene glycol  17 g Oral BID   pregabalin   200 mg Oral QHS   sodium chloride  flush  10-40 mL Intracatheter Q12H   tamsulosin   0.4 mg Oral QPM    Continuous Infusions:  TPN ADULT (ION) 80  mL/hr at 09/15/23 1559    PRN Medications:  HYDROcodone  bit-homatropine, HYDROmorphone  (DILAUDID ) injection, LORazepam , metoCLOPramide  (REGLAN ) injection, oxyCODONE , senna, sodium chloride  flush  Antimicrobials from admission:  Anti-infectives (From admission, onward)    Start     Dose/Rate Route Frequency Ordered Stop   09/11/23 2000  cefoTEtan  (CEFOTAN ) 2 g in sodium chloride  0.9 % 100 mL IVPB        2 g 200 mL/hr over 30 Minutes Intravenous Every 8 hours 09/11/23 1801 09/12/23 1910   09/11/23 0600  cefoTEtan  (CEFOTAN ) 2 g in sodium chloride  0.9 % 100 mL IVPB        2 g 200 mL/hr over 30 Minutes Intravenous On call to O.R. 09/10/23 0858 09/12/23 1910   09/04/23 1200  Ampicillin -Sulbactam (UNASYN ) 3 g in sodium chloride  0.9 % 100 mL IVPB  Status:  Discontinued        3 g 200 mL/hr over 30 Minutes Intravenous Every 6 hours 09/04/23 1052 09/08/23 1537   09/03/23 1800  ceFEPIme  (MAXIPIME ) 2 g in sodium chloride  0.9 % 100 mL IVPB  Status:  Discontinued        2 g 200 mL/hr over 30 Minutes Intravenous Every 12 hours 09/03/23 0812 09/04/23 0903   09/03/23 1500  metroNIDAZOLE  (FLAGYL ) IVPB 500 mg  Status:  Discontinued        500 mg 100 mL/hr over 60 Minutes Intravenous Every 12 hours 09/03/23 0758 09/04/23 0903   09/03/23 1000  oseltamivir  (TAMIFLU ) capsule 30 mg        30 mg Oral 2 times daily 09/03/23 0757 09/08/23 0959   09/03/23 0315  ceFEPIme  (MAXIPIME ) 2 g in sodium chloride  0.9  % 100 mL IVPB        2 g 200 mL/hr over 30 Minutes Intravenous  Once 09/03/23 0304 09/03/23 0441   09/03/23 0315  metroNIDAZOLE  (FLAGYL ) IVPB 500 mg        500 mg 100 mL/hr over 60 Minutes Intravenous  Once 09/03/23 0304 09/03/23 0441   09/03/23 0315  vancomycin  (VANCOCIN ) IVPB 1000 mg/200 mL premix        1,000 mg 200 mL/hr over 60 Minutes Intravenous  Once 09/03/23 0304 09/03/23 0441           Data Reviewed:  I have personally reviewed the following...  CBC: Recent Labs  Lab 09/11/23 0556 09/12/23 0530 09/13/23 0506 09/14/23 0635 09/15/23 0640  WBC 4.1 8.3 6.2 9.2 11.3*  HGB 8.4* 7.4* 8.1* 6.8* 9.6*  HCT 27.0* 23.9* 26.7* 22.6* 31.2*  MCV 97.1 96.8 98.5 97.4 95.1  PLT 193 168 176 164 192   Basic Metabolic Panel: Recent Labs  Lab 09/10/23 0559 09/11/23 0556 09/12/23 0500 09/12/23 0530 09/13/23 0506 09/15/23 0640  NA 141 140  --  141 143 137  K 4.0 4.1  --  4.6 4.2 5.4*  CL 104 103  --  105 109 106  CO2 28 26  --  26 26 25   GLUCOSE 119* 108*  --  150* 119* 139*  BUN 21 19  --  24* 26* 25*  CREATININE 0.86 0.92  --  1.08 1.08 0.87  CALCIUM  8.6* 8.4*  --  8.0* 8.1* 8.4*  MG 1.9 1.9  --  1.9 2.3  --   PHOS 3.4 5.2* 4.4  --  4.2  --    GFR: Estimated Creatinine Clearance: 88.3 mL/min (by C-G formula based on SCr of 0.87 mg/dL). Liver Function Tests: Recent Labs  Lab 09/10/23 0559 09/11/23 9443  09/12/23 0530 09/13/23 0506  AST 57* 33 20 18  ALT 48* 58* 38 35  ALKPHOS 47 51 44 47  BILITOT 0.4 0.6 0.3 0.5  PROT 6.3* 5.6* 5.2* 5.8*  ALBUMIN  2.7* 2.5* 2.5* 2.6*   No results for input(s): LIPASE, AMYLASE in the last 168 hours. No results for input(s): AMMONIA in the last 168 hours. Coagulation Profile: No results for input(s): INR, PROTIME in the last 168 hours. Cardiac Enzymes: No results for input(s): CKTOTAL, CKMB, CKMBINDEX, TROPONINI in the last 168 hours. BNP (last 3 results) No results for input(s): PROBNP in the last 8760  hours. HbA1C: No results for input(s): HGBA1C in the last 72 hours. CBG: Recent Labs  Lab 09/14/23 1205 09/14/23 1638 09/14/23 2358 09/15/23 0822 09/15/23 1205  GLUCAP 115* 144* 93 130* 123*   Lipid Profile: No results for input(s): CHOL, HDL, LDLCALC, TRIG, CHOLHDL, LDLDIRECT in the last 72 hours. Thyroid  Function Tests: No results for input(s): TSH, T4TOTAL, FREET4, T3FREE, THYROIDAB in the last 72 hours. Anemia Panel: No results for input(s): VITAMINB12, FOLATE, FERRITIN, TIBC, IRON, RETICCTPCT in the last 72 hours. Most Recent Urinalysis On File:     Component Value Date/Time   COLORURINE AMBER (A) 09/03/2023 0543   APPEARANCEUR CLOUDY (A) 09/03/2023 0543   APPEARANCEUR Clear 08/14/2014 0933   LABSPEC 1.026 09/03/2023 0543   LABSPEC 1.015 08/14/2014 0933   PHURINE 5.0 09/03/2023 0543   GLUCOSEU NEGATIVE 09/03/2023 0543   GLUCOSEU Negative 08/14/2014 0933   HGBUR NEGATIVE 09/03/2023 0543   BILIRUBINUR NEGATIVE 09/03/2023 0543   BILIRUBINUR Negative 08/14/2014 0933   KETONESUR NEGATIVE 09/03/2023 0543   PROTEINUR 30 (A) 09/03/2023 0543   UROBILINOGEN 0.2 06/12/2010 0103   NITRITE NEGATIVE 09/03/2023 0543   LEUKOCYTESUR LARGE (A) 09/03/2023 0543   LEUKOCYTESUR Negative 08/14/2014 0933   Sepsis Labs: @LABRCNTIP (procalcitonin:4,lacticidven:4) Microbiology: No results found for this or any previous visit (from the past 240 hours).     Radiology Studies last 3 days: DG Abd 2 Views Result Date: 09/13/2023 CLINICAL DATA:  Abdominal pain, bleeding EXAM: ABDOMEN - 2 VIEW COMPARISON:  09/10/2023 FINDINGS: Central line extends to the mid right atrium. The gastric tube is been removed. Skin staples over the right lower abdomen. Stomach and small bowel decompressed. Residual contrast in the nondistended colon. Left lower quadrant ostomy device. Left hip arthroplasty components partially visualized. IMPRESSION: Nonobstructive bowel gas pattern.  Electronically Signed   By: JONETTA Faes M.D.   On: 09/13/2023 19:25    Time spent 50 min     Alessa Mazur, DO [facility] 09/15/2023, 4:39 PM    Dictation software may have been used to generate the above note. Typos may occur and escape review in typed/dictated notes. Please contact Dr Marsa directly for clarity if needed.  Staff may message me via secure chat in Epic  but this may not receive an immediate response,  please page me for urgent matters!  If 7PM-7AM, please contact night coverage [email or web address]

## 2023-09-15 NOTE — Anesthesia Preprocedure Evaluation (Addendum)
 Anesthesia Evaluation  Patient identified by MRN, date of birth, ID band Patient awake    Reviewed: Allergy & Precautions, NPO status , Patient's Chart, lab work & pertinent test results  History of Anesthesia Complications Negative for: history of anesthetic complications  Airway Mallampati: II  TM Distance: >3 FB Neck ROM: full    Dental  (+) Poor Dentition   Pulmonary pneumonia (s/p 5 days abx, no longer requiring supplemental oxygen), COPD,  COPD inhaler, former smoker Has persistent cough, medicine team calling it post-viral cough   Pulmonary exam normal        Cardiovascular Exercise Tolerance: Poor hypertension, On Medications + CAD, + Past MI and +CHF  Normal cardiovascular exam+ dysrhythmias   Echo 04/12/23 INTERPRETATION  LOW-NORMAL LEFT VENTRICULAR SYSTOLIC FUNCTION WITH AN ESTIMATED EF = 45-50 %  NORMAL RIGHT VENTRICULAR SYSTOLIC FUNCTION  TRIVIAL REGURGITATION NOTED (See above)  NO VALVULAR STENOSIS  MODERATELY DILATED AORTIC ROOT MEASURING 4.4 CM   EKG 09/07/23 Sinus rhythm with 1st degree A-V block Low voltage QRS Inferior infarct (cited on or before 07-Sep-2023) Possible Anterolateral infarct (cited on or before 07-Sep-2023) Abnormal ECG When compared with ECG of 07-Sep-2023 12:45, (unconfirmed) Premature supraventricular complexes are no longer Present    Neuro/Psych  Headaches, Seizures -, Poorly Controlled,  PSYCHIATRIC DISORDERS Anxiety Depression     Neuromuscular disease CVA (L hemiparesis), Residual Symptoms    GI/Hepatic negative GI ROS, Neg liver ROS,,,Dysphagia, SBO   Endo/Other  negative endocrine ROS    Renal/GU CRF and ARFRenal disease     Musculoskeletal   Abdominal   Peds  Hematology  (+) Blood dyscrasia, anemia   Anesthesia Other Findings Past Medical History: No date: Alcohol  abuse     Comment:  drinks on weekend No date: Anemia No date: Anxiety No date: Arthritis No  date: Cancer (HCC)     Comment:  colon,throat No date: COPD (chronic obstructive pulmonary disease) (HCC) No date: Coronary artery disease No date: Depression No date: Gout No date: Hemiplegia and hemiparesis following cerebral infarction  affecting left non-dominant side (HCC) No date: Hypertension No date: Myocardial infarction (HCC) No date: 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 No date: Seizures (HCC)     Comment:  last 6 months ago No date: Stroke [facility])     Comment:  multiple  left side weakness No date: Tremors of nervous system No date: Unstable angina (HCC)  Past Surgical History: 10/19/2015: CARPAL TUNNEL RELEASE; Left     Comment:  Procedure: CARPAL TUNNEL RELEASE;  Surgeon: Ozell Flake, MD;  Location: [facility] ORS;  Service: Orthopedics;                Laterality: Left; 10/26/2021: COLONOSCOPY WITH PROPOFOL ; N/A     Comment:  Procedure: COLONOSCOPY WITH PROPOFOL ;  Surgeon:               Maryruth Ole DASEN, MD;  Location: [facility] ENDOSCOPY;                Service: Endoscopy;  Laterality: N/A; 06/02/2022: COLONOSCOPY WITH PROPOFOL ; N/A     Comment:  Procedure: COLONOSCOPY WITH PROPOFOL ;  Surgeon:               Maryruth Ole DASEN, MD;  Location: [facility] ENDOSCOPY;                Service: Endoscopy;  Laterality: N/A; 06/06/2022: COLONOSCOPY WITH PROPOFOL ; N/A  Comment:  Procedure: COLONOSCOPY WITH PROPOFOL ;  Surgeon:               Maryruth Ole DASEN, MD;  Location: [facility] ENDOSCOPY;                Service: Endoscopy;  Laterality: N/A; No date: JOINT REPLACEMENT     Comment:  left partial hip  09/12/2010: LAPAROSCOPIC SIGMOID COLECTOMY     Comment:  Lap hand assisted sigmoidectomy, mobilization splenic               flexure -- Dr. Dasie 09/05/2011: THROAT SURGERY     Comment:  cancer  BMI    Body Mass Index: 28.60 kg/m      Reproductive/Obstetrics negative OB ROS                             Anesthesia  Physical Anesthesia Plan  ASA: 4 and emergent  Anesthesia Plan: General   Post-op Pain Management: Ofirmev  IV (intra-op)* and Dilaudid  IV   Induction: Intravenous and Rapid sequence  PONV Risk Score and Plan: 2 and Ondansetron , Dexamethasone  and Treatment may vary due to age or medical condition  Airway Management Planned: Oral ETT and Video Laryngoscope Planned  Additional Equipment: None  Intra-op Plan:   Post-operative Plan: Extubation in OR and Possible Post-op intubation/ventilation  Informed Consent: I have reviewed the patients History and Physical, chart, labs and discussed the procedure including the risks, benefits and alternatives for the proposed anesthesia with the patient or authorized representative who has indicated his/her understanding and acceptance.     Dental Advisory Given and Consent reviewed with POA  Plan Discussed with: Anesthesiologist, CRNA and Surgeon  Anesthesia Plan Comments: (Discussed risks of anesthesia with patient, including PONV, sore throat, lip/dental/eye damage, aspiration. Rare risks discussed as well, such as cardiorespiratory and neurological sequelae, and allergic reactions. Discussed the role of CRNA in patient's perioperative care. Patient understands. Patient counseled on being higher risk for anesthesia due to comorbidities: persistent cough and recent pneumonia, acute bowel dehiscence. Patient was told about increased risk of cardiac and respiratory events, including death. I talked about potential prolonged intubation.   Additionally, I called daughter Corean Molt to discuss all of the above, given acute and critical nature of patient's illness. She confirmed patient's Full Code. )        Anesthesia Quick Evaluation

## 2023-09-15 NOTE — Progress Notes (Signed)
 Subjective:  CC: Calvin [name] is a 73 y.o. male  Hospital stay day 12, 4 Days Post-Op s/p exploratory laparotomy, LOA, and colostomy revision   HPI: Continues to complain of incisional pain.  He also cannot stop coughing  ROS:  General: Denies weight loss, weight gain, fatigue, fevers, chills, and night sweats. Heart: Denies chest pain, palpitations, racing heart, irregular heartbeat, leg pain or swelling, and decreased activity tolerance. Respiratory: Denies breathing difficulty, shortness of breath, wheezing, cough, and sputum. GI: Denies change in appetite, heartburn, nausea, vomiting, constipation, diarrhea, and blood in stool. GU: Denies difficulty urinating, pain with urinating, urgency, frequency, blood in urine.   Objective:   Temp:  [97.6 F (36.4 C)-98.5 F (36.9 C)] 97.7 F (36.5 C) (01/11 0825) Pulse Rate:  [78-100] 92 (01/11 0825) Resp:  [15-24] 20 (01/11 0825) BP: (92-143)/(56-91) 120/74 (01/11 0825) SpO2:  [94 %-100 %] 100 % (01/11 0825)     Height: 5' 11 (180.3 cm) Weight: 96.5 kg BMI (Calculated): 29.68   Intake/Output this shift:   Intake/Output Summary (Last 24 hours) at 09/15/2023 1009 Last data filed at 09/15/2023 0500 Gross per 24 hour  Intake 1874.58 ml  Output 2275 ml  Net -400.42 ml    Constitutional :  alert, cooperative, appears stated age, and mild distress  Respiratory:  clear to auscultation bilaterally  Cardiovascular:  regular rate and rhythm  Gastrointestinal: Soft, increased bulge at midline incision with obvious tension at the staple line site.  Tenderness to palpation along it as well.  Ostomy intact and healthy despite being edematous .   Skin: Cool and moist.   Psychiatric: Normal affect, non-agitated, not confused       LABS:     Latest Ref Rng & Units 09/15/2023    6:40 AM 09/13/2023    5:06 AM 09/12/2023    5:30 AM  CMP  Glucose 70 - 99 mg/dL 860  880  849   BUN 8 - 23 mg/dL 25  26  24    Creatinine 0.61 - 1.24 mg/dL 9.12  8.91   8.91   Sodium 135 - 145 mmol/L 137  143  141   Potassium 3.5 - 5.1 mmol/L 5.4  4.2  4.6   Chloride 98 - 111 mmol/L 106  109  105   CO2 22 - 32 mmol/L 25  26  26    Calcium  8.9 - 10.3 mg/dL 8.4  8.1  8.0   Total Protein 6.5 - 8.1 g/dL  5.8  5.2   Total Bilirubin 0.0 - 1.2 mg/dL  0.5  0.3   Alkaline Phos 38 - 126 U/L  47  44   AST 15 - 41 U/L  18  20   ALT 0 - 44 U/L  35  38       Latest Ref Rng & Units 09/15/2023    6:40 AM 09/14/2023    6:35 AM 09/13/2023    5:06 AM  CBC  WBC 4.0 - 10.5 K/uL 11.3  9.2  6.2   Hemoglobin 13.0 - 17.0 g/dL 9.6  6.8  8.1   Hematocrit 39.0 - 52.0 % 31.2  22.6  26.7   Platelets 150 - 400 K/uL 192  164  176     RADS: Pending official CT read but review of imaging concerning for fascial dehiscence Assessment:   s/p exploratory laparotomy, LOA, and colostomy revision.  Worsening coughing episodes likely leading to fascial dehiscence.  Due to the concern this may progress to full evisceration, recommend  proceeding with surgical repair of the dehiscence with placement of retention sutures to minimize worsening.  Discussed case with daughter as well as the patient both are in agreement to proceeding with surgery.  Will take emergently.  Also discussed case with primary surgeon Dr. Desiderio and he agrees with the plan as well.  labs/images/medications/previous chart entries reviewed personally and relevant changes/updates noted above.

## 2023-09-15 NOTE — Transfer of Care (Signed)
 Immediate Anesthesia Transfer of Care Note  Patient: Calvin [name]  Procedure(s) Performed: EXPLORATORY LAPAROTOMY  Patient Location: PACU  Anesthesia Type:General  Level of Consciousness: awake, alert , and oriented  Airway & Oxygen Therapy: Patient Spontanous Breathing and Patient connected to face mask oxygen  Post-op Assessment: Report given to RN, Post -op Vital signs reviewed and stable, and Patient moving all extremities  Post vital signs: Reviewed and stable  Last Vitals:  Vitals Value Taken Time  BP 142/81 09/15/23 2015  Temp    Pulse 87 09/15/23 2018  Resp 14 09/15/23 2018  SpO2 98 % 09/15/23 2018  Vitals shown include unfiled device data.  Last Pain:  Vitals:   09/15/23 1646  TempSrc: Oral  PainSc:       Patients Stated Pain Goal: 0 (09/15/23 0500)  Complications: No notable events documented.

## 2023-09-15 NOTE — Progress Notes (Signed)
Patient sent for procedure via bed in stable condition.

## 2023-09-15 NOTE — Progress Notes (Signed)
 Patient received from procedure via bed in stable condition.

## 2023-09-15 NOTE — Progress Notes (Signed)
 Pt's ostomy site is weeping a lot.  Colostomy bag was removed due to saturation.  Site was cleaned and a new 2-piece pouch was applied.  Pt's abdomen found to be distended and taut.  Pt coughing and hacking until he forces himself to vomit.  This RN attempted to educate pt on the importance of controlling his breathing and limiting the length, and force, of his coughing.

## 2023-09-15 NOTE — Plan of Care (Signed)

## 2023-09-15 NOTE — Anesthesia Procedure Notes (Addendum)
 Procedure Name: Intubation Date/Time: 09/15/2023 6:33 PM  Performed by: Landy Francena BIRCH, CRNAPre-anesthesia Checklist: Patient identified, Emergency Drugs available, Suction available and Patient being monitored Patient Re-evaluated:Patient Re-evaluated prior to induction Oxygen Delivery Method: Circle system utilized Preoxygenation: Pre-oxygenation with 100% oxygen Induction Type: IV induction and Rapid sequence Laryngoscope Size: McGrath and 3 Tube type: Oral Tube size: 7.5 mm Number of attempts: 1 Airway Equipment and Method: Stylet, Oral airway, LTA kit utilized and Bite block Placement Confirmation: ETT inserted through vocal cords under direct vision, positive ETCO2 and breath sounds checked- equal and bilateral Secured at: 22 cm Tube secured with: Tape Dental Injury: Teeth and Oropharynx as per pre-operative assessment

## 2023-09-15 NOTE — Plan of Care (Signed)
  Problem: Respiratory: Goal: Ability to maintain a clear airway will improve Outcome: Not Progressing   Problem: Pain Management: Goal: General experience of comfort will improve Outcome: Not Progressing   Problem: Elimination: Goal: Will not experience complications related to bowel motility Outcome: Not Progressing   Problem: Coping: Goal: Level of anxiety will decrease     Outcome: Not Progressing  -----------------------------------------------------------------------------  Problem: Education: Goal: Knowledge of General Education information will improve Description: Including pain rating scale, medication(s)/side effects and non-pharmacologic comfort measures Outcome: Progressing   Problem: Health Behavior/Discharge Planning: Goal: Ability to manage health-related needs will improve Outcome: Progressing   Problem: Clinical Measurements: Goal: Ability to maintain clinical measurements within normal limits will improve Outcome: Progressing Goal: Will remain free from infection Outcome: Progressing Goal: Diagnostic test results will improve Outcome: Progressing Goal: Respiratory complications will improve Outcome: Progressing Goal: Cardiovascular complication will be avoided Outcome: Progressing   Problem: Activity: Goal: Risk for activity intolerance will decrease Outcome: Progressing   Problem: Nutrition: Goal: Adequate nutrition will be maintained Outcome: Progressing  Problem: Elimination: Goal: Will not experience complications related to urinary retention Outcome: Progressing   Problem: Safety: Goal: Ability to remain free from injury will improve Outcome: Progressing   Problem: Skin Integrity: Goal: Risk for impaired skin integrity will decrease Outcome: Progressing   Problem: Education: Goal: Knowledge of disease or condition will improve Outcome: Progressing Goal: Knowledge of the prescribed therapeutic regimen will improve Outcome:  Progressing Goal: Individualized Educational Video(s) Outcome: Progressing   Problem: Activity: Goal: Ability to tolerate increased activity will improve Outcome: Progressing Goal: Will verbalize the importance of balancing activity with adequate rest periods Outcome: Progressing   Problem: Respiratory: Goal: Levels of oxygenation will improve Outcome: Progressing   Problem: Respiratory: Goal: Ability to maintain adequate ventilation will improve Outcome: Progressing

## 2023-09-15 NOTE — Progress Notes (Signed)
 Patient alert and oriented X 3.he is coughing constantly, and his abdomin looks like more distended that yesterday, he is having abdominal  pain constantly. MD made aware. Plan of care ongoing.

## 2023-09-15 NOTE — Progress Notes (Signed)
 Patient went for procedure via bed in stable condition.

## 2023-09-16 DIAGNOSIS — J9601 Acute respiratory failure with hypoxia: Secondary | ICD-10-CM | POA: Diagnosis not present

## 2023-09-16 LAB — GLUCOSE, CAPILLARY
Glucose-Capillary: 104 mg/dL — ABNORMAL HIGH (ref 70–99)
Glucose-Capillary: 106 mg/dL — ABNORMAL HIGH (ref 70–99)
Glucose-Capillary: 120 mg/dL — ABNORMAL HIGH (ref 70–99)
Glucose-Capillary: 97 mg/dL (ref 70–99)

## 2023-09-16 LAB — BASIC METABOLIC PANEL
Anion gap: 6 (ref 5–15)
BUN: 21 mg/dL (ref 8–23)
CO2: 24 mmol/L (ref 22–32)
Calcium: 8.2 mg/dL — ABNORMAL LOW (ref 8.9–10.3)
Chloride: 104 mmol/L (ref 98–111)
Creatinine, Ser: 0.92 mg/dL (ref 0.61–1.24)
GFR, Estimated: >60 mL/min (ref >60)
Glucose, Bld: 129 mg/dL — ABNORMAL HIGH (ref 70–99)
Potassium: 6 mmol/L — ABNORMAL HIGH (ref 3.5–5.1)
Sodium: 134 mmol/L — ABNORMAL LOW (ref 135–145)

## 2023-09-16 LAB — CBC
HCT: 29.2 % — ABNORMAL LOW (ref 39.0–52.0)
Hemoglobin: 9 g/dL — ABNORMAL LOW (ref 13.0–17.0)
MCH: 28.8 pg (ref 26.0–34.0)
MCHC: 30.8 g/dL (ref 30.0–36.0)
MCV: 93.6 fL (ref 80.0–100.0)
Platelets: 173 10*3/uL (ref 150–400)
RBC: 3.12 MIL/uL — ABNORMAL LOW (ref 4.22–5.81)
RDW: 16.2 % — ABNORMAL HIGH (ref 11.5–15.5)
WBC: 9.9 10*3/uL (ref 4.0–10.5)
nRBC: 0 % (ref 0.0–0.2)

## 2023-09-16 MED ORDER — CHLORHEXIDINE GLUCONATE CLOTH 2 % EX PADS
6.0000 | MEDICATED_PAD | Freq: Every day | CUTANEOUS | Status: DC
  Administered 2023-09-16 – 2023-09-22 (×7): 6 via TOPICAL

## 2023-09-16 NOTE — Plan of Care (Signed)
   Problem: Clinical Measurements: Goal: Respiratory complications will improve Outcome: Progressing   Problem: Coping: Goal: Level of anxiety will decrease Outcome: Progressing

## 2023-09-16 NOTE — Progress Notes (Signed)
 PROGRESS NOTE    LEDGER Calvin [name]   FMW:[id number] DOB: 1951-03-16  DOA: 09/03/2023 Date of Service: 09/16/23 which is hospital day 13  PCP: Laurine Gladden, MD    Hospital course / significant events:   HPI: Calvin [name] is a 73 y.o. 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   12/30: admitted to hospitalist sepsis, AKI, (+)Influenza A, likely aspiration pneumonia, acute hypoxic resp fail, SBO. NG placed, surgical consult.  12/31-01/06: relatively stable, remains SBO w/ NG and on TPN, worsening colostomy prolapse. Anemia w/o overt GIB, holding anti-plt/anti-cogs, GI no plans for endoscopic eval at this time 01/07: exlap, LOA, and colostomy revision  01/08: stable.  01/09: d/c NG 01/10: advancing diet as able, remains on TPN. Hgb 6.8 - 1 unit PRBC given  01/11: persistent abd pain and cough, imaging concerning for abdominal surgical wound dehiscence deeper layers, going back to OR. Continue w/ cough suppression, await over-read but no obvious CT chest findings, suspect post-viral bronchitis  01/12: to OT early AM for closure of wound dehiscence. Postop stable, NG in place, clamped today. CT chest question pneumonia but pt reports coughing improved, suspect was mostly reflux, will continue to monitor cough. Postop care per general surgery include clamp NG and remain NPO for now     Consultants:  General surgery   Procedures/Surgeries: 09/11/23: exlap, LOA, and colostomy revision  09/16/23: closure of wound dehiscence      ASSESSMENT & PLAN:   Flu A CAP improving, s/p tamiflu  and abx for CAP/aspiration No clearly worsening pneumonia on CT despite cough, see below, no indication for  abx for pneumonitis, hold abx   SBO vs ileus History ogilvie syndrome s/p colostomy. s/p ex-lab with lysis of adhesions on 1/7 S/p wound dehiscence repair 1/12 TPN weaning off  NPO for now and NG in place - advance diet per gen surg  Cough, multifactorial Postviral/post-infectious component is likely Given CT findings of esophageal fluid/dilation, suspect reflux and/or aspiration pneumonitis is also likely especially since improvement w/ surgery and re-placement of NG Hx COPD but no apparent exacerbation No clearly worsening pneumonia, no indication for abx for pneumonitis, hold abx  Monitor sirs/sepsis parameters  Cough suppressants  PPI Maintain NG for now   Acute hypoxic respiratory failure Due to above  wean [place] O2 as able   Anemia continue bid ppi hold asa/heparin  for now can probably resume shortly trend hgb/CBC transfuse for Hgb <7   Colostomy irritation/prolapse gen surg following  mild bleeding from irritate/edematous tissue, ok to monitor    COPD No acute exacerbation S/p 6 days prednisone  Nebs as ordered   AKI on ckd 2 resolved Prerenal 2/2 dehydration monitor BMP   Seizure disorder With recent hospitalization for breakthrough seizures continue keppra , home lamictal  and pregabalin    hx CVA  With hemiplegia hold asa given anemia/bleeding risk as above   BPH  cont home flomax    Chronic HFpEF Dehydrated as above judicious IV fluids if needed   Debility Resides at snf PT at LTC     DVT prophylaxis: held given anemia IV fluids: no continuous IV fluids  Nutrition: NPO for now  Central lines / invasive devices: NG, central line, PICC   Code Status: FULL CODE ACP documentation reviewed:  copy  of DNR and MOST on file in [facility], DNR was voided dated 2021, MOST notes FULL CODE dated 2023  TOC needs: will need to arrange back to LTC when dc appropriate, outpatint PT at facility  Barriers to dispo / significant pending items: NG in place, surgical  clearance prior to dc              Subjective / Brief ROS:  Patient reports abdominal pain and cough are better today  He is asking about eating something, frustrated at the NG  Denies new weakness. .   Family Communication: family at bedside on rounds     Objective Findings:  Vitals:   09/16/23 0742 09/16/23 0830 09/16/23 1123 09/16/23 1412  BP:  127/69 126/78   Pulse:  79 77   Resp:  16    Temp:  97.6 F (36.4 C) (!) 97.5 F (36.4 C)   TempSrc:      SpO2: 99% 100% 100% 99%  Weight:      Height:        Intake/Output Summary (Last 24 hours) at 09/16/2023 1535 Last data filed at 09/16/2023 0500 Gross per 24 hour  Intake 925 ml  Output 3130 ml  Net -2205 ml   Filed Weights   09/12/23 0347 09/13/23 0342 09/16/23 0500  Weight: 96 kg 96.5 kg 99.1 kg    Examination:  Physical Exam Constitutional:      General: He is not in acute distress. Cardiovascular:     Rate and Rhythm: Normal rate and regular rhythm.  Pulmonary:     Effort: No respiratory distress.     Breath sounds: Rhonchi (scattered coarse breath sounds) present.  Abdominal:     General: A surgical scar is present. Bowel sounds are normal. There is distension (much improved from yesterday).     Palpations: Abdomen is soft.     Tenderness: There is no abdominal tenderness (incision site, less severe compared to yesterday).     Comments: Ostomy prolapse w/ some irritated friable appearing tissue, scant red thin serous bloody liquid in ostomy bag no bloody stool   Skin:    General: Skin is warm and dry.  Neurological:     Mental Status: He is alert.  Psychiatric:        Mood and Affect: Mood normal.        Behavior: Behavior normal.          Scheduled Medications:   acetaminophen   650 mg Oral Q6H   baclofen   5 mg Oral BID   benzonatate   200 mg Oral TID   budesonide  (PULMICORT ) nebulizer solution  0.25 mg Nebulization BID   Chlorhexidine  Gluconate Cloth  6 each Topical Daily    dextromethorphan   60 mg Oral BID   insulin  aspart  0-9 Units Subcutaneous Q8H   ipratropium-albuterol   3 mL Inhalation TID   lactulose   20 g Oral Daily   lamoTRIgine   50 mg Oral BID   levETIRAcetam   1,000 mg Oral BID   melatonin  10 mg Oral QHS   mometasone -formoterol   2 puff Inhalation BID   pantoprazole  (PROTONIX ) IV  40 mg Intravenous Q12H   polyethylene glycol  17 g Oral BID   pregabalin   200 mg Oral QHS   sodium chloride  flush  10-40 mL Intracatheter Q12H   tamsulosin   0.4 mg Oral QPM    Continuous Infusions:    PRN Medications:  HYDROcodone  bit-homatropine, HYDROmorphone  (DILAUDID ) injection, LORazepam , metoCLOPramide  (REGLAN ) injection, oxyCODONE , senna, sodium chloride  flush  Antimicrobials  from admission:  Anti-infectives (From admission, onward)    Start     Dose/Rate Route Frequency Ordered Stop   09/11/23 2000  cefoTEtan  (CEFOTAN ) 2 g in sodium chloride  0.9 % 100 mL IVPB        2 g 200 mL/hr over 30 Minutes Intravenous Every 8 hours 09/11/23 1801 09/12/23 1910   09/11/23 0600  cefoTEtan  (CEFOTAN ) 2 g in sodium chloride  0.9 % 100 mL IVPB        2 g 200 mL/hr over 30 Minutes Intravenous On call to O.R. 09/10/23 0858 09/12/23 1910   09/04/23 1200  Ampicillin -Sulbactam (UNASYN ) 3 g in sodium chloride  0.9 % 100 mL IVPB  Status:  Discontinued        3 g 200 mL/hr over 30 Minutes Intravenous Every 6 hours 09/04/23 1052 09/08/23 1537   09/03/23 1800  ceFEPIme  (MAXIPIME ) 2 g in sodium chloride  0.9 % 100 mL IVPB  Status:  Discontinued        2 g 200 mL/hr over 30 Minutes Intravenous Every 12 hours 09/03/23 0812 09/04/23 0903   09/03/23 1500  metroNIDAZOLE  (FLAGYL ) IVPB 500 mg  Status:  Discontinued        500 mg 100 mL/hr over 60 Minutes Intravenous Every 12 hours 09/03/23 0758 09/04/23 0903   09/03/23 1000  oseltamivir  (TAMIFLU ) capsule 30 mg        30 mg Oral 2 times daily 09/03/23 0757 09/08/23 0959   09/03/23 0315  ceFEPIme  (MAXIPIME ) 2 g in sodium chloride  0.9 % 100  mL IVPB        2 g 200 mL/hr over 30 Minutes Intravenous  Once 09/03/23 0304 09/03/23 0441   09/03/23 0315  metroNIDAZOLE  (FLAGYL ) IVPB 500 mg        500 mg 100 mL/hr over 60 Minutes Intravenous  Once 09/03/23 0304 09/03/23 0441   09/03/23 0315  vancomycin  (VANCOCIN ) IVPB 1000 mg/200 mL premix        1,000 mg 200 mL/hr over 60 Minutes Intravenous  Once 09/03/23 0304 09/03/23 0441           Data Reviewed:  I have personally reviewed the following...  CBC: Recent Labs  Lab 09/12/23 0530 09/13/23 0506 09/14/23 0635 09/15/23 0640 09/16/23 0640  WBC 8.3 6.2 9.2 11.3* 9.9  HGB 7.4* 8.1* 6.8* 9.6* 9.0*  HCT 23.9* 26.7* 22.6* 31.2* 29.2*  MCV 96.8 98.5 97.4 95.1 93.6  PLT 168 176 164 192 173   Basic Metabolic Panel: Recent Labs  Lab 09/10/23 0559 09/11/23 0556 09/12/23 0500 09/12/23 0530 09/13/23 0506 09/15/23 0640 09/16/23 0756  NA 141 140  --  141 143 137 134*  K 4.0 4.1  --  4.6 4.2 5.4* 6.0*  CL 104 103  --  105 109 106 104  CO2 28 26  --  26 26 25 24   GLUCOSE 119* 108*  --  150* 119* 139* 129*  BUN 21 19  --  24* 26* 25* 21  CREATININE 0.86 0.92  --  1.08 1.08 0.87 0.92  CALCIUM  8.6* 8.4*  --  8.0* 8.1* 8.4* 8.2*  MG 1.9 1.9  --  1.9 2.3  --   --   PHOS 3.4 5.2* 4.4  --  4.2  --   --    GFR: Estimated Creatinine Clearance: 84.5 mL/min (by C-G formula based on SCr of 0.92 mg/dL). Liver Function Tests: Recent Labs  Lab 09/10/23 0559 09/11/23 0556 09/12/23 0530 09/13/23 0506  AST 57* 33 20 18  ALT 48* 58* 38 35  ALKPHOS 47 51 44 47  BILITOT 0.4 0.6 0.3 0.5  PROT 6.3* 5.6* 5.2* 5.8*  ALBUMIN  2.7* 2.5* 2.5* 2.6*   No results for input(s): LIPASE, AMYLASE in the last 168 hours. No results for input(s): AMMONIA in the last 168 hours. Coagulation Profile: No results for input(s): INR, PROTIME in the last 168 hours. Cardiac Enzymes: No results for input(s): CKTOTAL, CKMB, CKMBINDEX, TROPONINI in the last 168 hours. BNP (last 3  results) No results for input(s): PROBNP in the last 8760 hours. HbA1C: No results for input(s): HGBA1C in the last 72 hours. CBG: Recent Labs  Lab 09/15/23 1645 09/15/23 2023 09/16/23 0045 09/16/23 0047 09/16/23 0726  GLUCAP 94 144* 97 120* 106*   Lipid Profile: No results for input(s): CHOL, HDL, LDLCALC, TRIG, CHOLHDL, LDLDIRECT in the last 72 hours. Thyroid  Function Tests: No results for input(s): TSH, T4TOTAL, FREET4, T3FREE, THYROIDAB in the last 72 hours. Anemia Panel: No results for input(s): VITAMINB12, FOLATE, FERRITIN, TIBC, IRON, RETICCTPCT in the last 72 hours. Most Recent Urinalysis On File:     Component Value Date/Time   COLORURINE AMBER (A) 09/03/2023 0543   APPEARANCEUR CLOUDY (A) 09/03/2023 0543   APPEARANCEUR Clear 08/14/2014 0933   LABSPEC 1.026 09/03/2023 0543   LABSPEC 1.015 08/14/2014 0933   PHURINE 5.0 09/03/2023 0543   GLUCOSEU NEGATIVE 09/03/2023 0543   GLUCOSEU Negative 08/14/2014 0933   HGBUR NEGATIVE 09/03/2023 0543   BILIRUBINUR NEGATIVE 09/03/2023 0543   BILIRUBINUR Negative 08/14/2014 0933   KETONESUR NEGATIVE 09/03/2023 0543   PROTEINUR 30 (A) 09/03/2023 0543   UROBILINOGEN 0.2 06/12/2010 0103   NITRITE NEGATIVE 09/03/2023 0543   LEUKOCYTESUR LARGE (A) 09/03/2023 0543   LEUKOCYTESUR Negative 08/14/2014 0933   Sepsis Labs: @LABRCNTIP (procalcitonin:4,lacticidven:4) Microbiology: No results found for this or any previous visit (from the past 240 hours).     Radiology Studies last 3 days: CT CHEST ABDOMEN PELVIS W CONTRAST Result Date: 09/15/2023 CLINICAL DATA:  Abdominal pain, postop. Respiratory distress. Electronic records indicates recent laparotomy with lysis of adhesions and colostomy revision. EXAM: CT CHEST, ABDOMEN, AND PELVIS WITH CONTRAST TECHNIQUE: Multidetector CT imaging of the chest, abdomen and pelvis was performed following the standard protocol during bolus administration of  intravenous contrast. RADIATION DOSE REDUCTION: This exam was performed according to the departmental dose-optimization program which includes automated exposure control, adjustment of the mA and/or kV according to patient size and/or use of iterative reconstruction technique. CONTRAST:  OMNIPAQUE  IOHEXOL  300 MG/ML  SOLN COMPARISON:  Abdominopelvic CT 09/06/2023 FINDINGS: CT CHEST FINDINGS Cardiovascular: Right internal jugular central line tip is in the right atrium. Right upper extremity PICC tip is in the mid SVC. The heart is mildly enlarged. There are coronary artery calcifications. Aortic atherosclerosis without aneurysm or acute aortic findings. Mediastinum/Nodes: The esophagus is diffusely dilated and fluid-filled to the thoracic inlet. No mediastinal or hilar adenopathy. Lungs/Pleura: Right lower lobe consolidation with central air bronchograms, similar to prior. There are adjacent nodular densities in the right middle lobe. Dependent density in the left lower lobe is similar. Small right and trace left pleural effusion. Breathing motion artifact limits detailed assessment. No features of pulmonary edema. No intraluminal debris. Musculoskeletal: Avascular necrosis of both humeral heads. Subchondral collapse on the left. Remote medial left clavicle fracture. CT ABDOMEN PELVIS FINDINGS Hepatobiliary: No focal liver lesion. Punctate hepatic granuloma. Layering gallstones in the gallbladder. No pericholecystic inflammation. There is no biliary dilatation. Pancreas: No ductal dilatation or inflammation. Spleen: Normal in  size without focal abnormality. Adrenals/Urinary Tract: Unchanged bilateral adrenal nodules, adenomas based on prior imaging. No imaging follow-up is recommended. Stable lobulated renal contours of both kidneys. No hydronephrosis. No suspicious renal lesion. There is mild diffuse bladder wall thickening. Stomach/Bowel: The stomach is prominently distended with air and fluid. Dilated  fluid-filled duodenum and small bowel. There is no discrete small bowel transition point. Midline ventral abdominal wall hernia contains loops of small bowel, without associated small bowel wall thickening. Normal appendix is visualized. Fluid/liquid stool within the cecum and ascending colon. Transverse colon is decompressed. Left lower quadrant colostomy. There is retained barium within the excluded descending and sigmoid colon. Vascular/Lymphatic: Aortic and branch atherosclerosis. No aneurysm. The portal vein is patent. No enlarged lymph nodes in the abdomen or pelvis. Reproductive: Unremarkable prostate. Other: Laparotomy changes with midline skin staples. Rectus diastasis with midline ventral abdominal wall hernia containing small bowel that is not inflamed. There is generalized body wall edema. No significant ascites or free fluid. No abdominopelvic collection. No free air. Musculoskeletal: Left hip arthroplasty. There are no acute or suspicious osseous abnormalities. IMPRESSION: 1. Diffusely dilated fluid-filled esophagus to the thoracic inlet. The stomach is prominently distended with air and fluid. Dilated fluid-filled duodenum and small bowel. No discrete small bowel transition point. Favor postoperative ileus, although recurrent obstruction could have a similar appearance. 2. Left lower quadrant colostomy. Fluid/liquid stool within the cecum and ascending colon. 3. Dependent densities in the lower lobes, right greater than left. Findings may represent atelectasis or pneumonia, including aspiration. Adjacent nodular airspace disease in the right middle lobe is suspicious for pneumonia. 4. Small right and trace left pleural effusions. 5. Cholelithiasis. 6. Avascular necrosis of both humeral heads with subchondral collapse on the left. 7. Generalized body wall edema. Aortic Atherosclerosis (ICD10-I70.0). Electronically Signed   By: Andrea Gasman M.D.   On: 09/15/2023 17:30   DG Abd 2 Views Result  Date: 09/13/2023 CLINICAL DATA:  Abdominal pain, bleeding EXAM: ABDOMEN - 2 VIEW COMPARISON:  09/10/2023 FINDINGS: Central line extends to the mid right atrium. The gastric tube is been removed. Skin staples over the right lower abdomen. Stomach and small bowel decompressed. Residual contrast in the nondistended colon. Left lower quadrant ostomy device. Left hip arthroplasty components partially visualized. IMPRESSION: Nonobstructive bowel gas pattern. Electronically Signed   By: JONETTA Faes M.D.   On: 09/13/2023 19:25    Time spent 50 min     Liviah Cake, DO [facility] 09/16/2023, 3:35 PM    Dictation software may have been used to generate the above note. Typos may occur and escape review in typed/dictated notes. Please contact Dr Marsa directly for clarity if needed.  Staff may message me via secure chat in Epic  but this may not receive an immediate response,  please page me for urgent matters!  If 7PM-7AM, please contact night coverage [email or web address]

## 2023-09-16 NOTE — Progress Notes (Signed)
 Subjective:  CC: Calvin [name] is a 73 y.o. male  Hospital stay day 13, 1 Day Post-Op wound dehiscence repair with retention suture placement s/p exploratory laparotomy, LOA, and colostomy revision   HPI: Continues to complain of incisional pain.  No active coughing during exam today  ROS:  General: Denies weight loss, weight gain, fatigue, fevers, chills, and night sweats. Heart: Denies chest pain, palpitations, racing heart, irregular heartbeat, leg pain or swelling, and decreased activity tolerance. Respiratory: Denies breathing difficulty, shortness of breath, wheezing, cough, and sputum. GI: Denies change in appetite, heartburn, nausea, vomiting, constipation, diarrhea, and blood in stool. GU: Denies difficulty urinating, pain with urinating, urgency, frequency, blood in urine.   Objective:   Temp:  [97.4 F (36.3 C)-98.7 F (37.1 C)] 97.6 F (36.4 C) (01/12 0830) Pulse Rate:  [70-110] 79 (01/12 0830) Resp:  [7-20] 16 (01/12 0830) BP: (101-159)/(63-100) 127/69 (01/12 0830) SpO2:  [96 %-100 %] 100 % (01/12 0830) Weight:  [99.1 kg] 99.1 kg (01/12 0500)     Height: 5' 11 (180.3 cm) Weight: 99.1 kg BMI (Calculated): 30.48   Intake/Output this shift:   Intake/Output Summary (Last 24 hours) at 09/16/2023 1047 Last data filed at 09/16/2023 0500 Gross per 24 hour  Intake 925 ml  Output 5380 ml  Net -4455 ml    Constitutional :  alert, cooperative, appears stated age, and mild distress  Respiratory:  clear to auscultation bilaterally  Cardiovascular:  regular rate and rhythm  Gastrointestinal: Soft, retention sutures intact with dressing with minimal sanguinous output..  Ostomy intact and healthy despite being edematous .   Skin: Cool and moist.   Psychiatric: Normal affect, non-agitated, not confused       LABS:     Latest Ref Rng & Units 09/15/2023    6:40 AM 09/13/2023    5:06 AM 09/12/2023    5:30 AM  CMP  Glucose 70 - 99 mg/dL 860  880  849   BUN 8 - 23 mg/dL 25  26   24    Creatinine 0.61 - 1.24 mg/dL 9.12  8.91  8.91   Sodium 135 - 145 mmol/L 137  143  141   Potassium 3.5 - 5.1 mmol/L 5.4  4.2  4.6   Chloride 98 - 111 mmol/L 106  109  105   CO2 22 - 32 mmol/L 25  26  26    Calcium  8.9 - 10.3 mg/dL 8.4  8.1  8.0   Total Protein 6.5 - 8.1 g/dL  5.8  5.2   Total Bilirubin 0.0 - 1.2 mg/dL  0.5  0.3   Alkaline Phos 38 - 126 U/L  47  44   AST 15 - 41 U/L  18  20   ALT 0 - 44 U/L  35  38       Latest Ref Rng & Units 09/16/2023    6:40 AM 09/15/2023    6:40 AM 09/14/2023    6:35 AM  CBC  WBC 4.0 - 10.5 K/uL 9.9  11.3  9.2   Hemoglobin 13.0 - 17.0 g/dL 9.0  9.6  6.8   Hematocrit 39.0 - 52.0 % 29.2  31.2  22.6   Platelets 150 - 400 K/uL 173  192  164     RADS: N/a Assessment:   wound dehiscence repair with retention suture placements/p exploratory laparotomy, LOA, and colostomy revision.  Stable with moderate amount of output recorded from ostomy.  NG output has also thinned out.  Due to the recurrent  nature of ileus type issues for him, we will proceed with a clamp trial for a full 24 hours before considering removal.  labs/images/medications/previous chart entries reviewed personally and relevant changes/updates noted above.

## 2023-09-16 NOTE — Op Note (Signed)
 Preoperative diagnosis: wound dehiscence Postoperative diagnosis: same  Procedure: closure of wound dehiscence Anesthesia: GETA  Surgeon: TYE  Wound Classification: Contaminated  Indications: Patient is a 73 y.o. male  presented with ABOVE.  See H&P for further details.  Specimen: NONE  Complications: None  Estimated Blood Loss: 30mL  Findings:  1. Fascial dehiscence at former incision site from recent ostomy revision 2. Retention sutures placed after closure 3. Adequate hemostasis.   Description of procedure: The patient was placed in the supine position and GETA anesthesia was induced. Leaking ostomy bag replaced then prepped still intact to avoid spillage of stool into the midline wound. Midline staple removed. The area was prepped.  Towel placed over ostomy appliance and secured to skin using staples.  Ioband then used to further isolate the midline wound. A timeout was completed verifying correct patient, procedure, site, positioning, and implant(s) and/or special equipment prior to beginning this procedure.  Wound was gently probed and skin separated easily and bowel noted immediaely deep to dermal layer.  Developing adhesions gently removed with finger fracture and bowel contents reduced back into abdominal cavity.  Fascia and tissue within the incision site was severely attenuated and friable, but somewhat intact fascia was able to be identified.  Retention sutures with 2 PDS placed through fascia but not penetrating the peritoneum under direct visualization in an interrupted fashion.  1 PDS x2 then used to close the incision in a running fashion, taking care not to include bowel but getting strong bites of available fascia without pulling through it. Minimal tension noted after closure.  After incision was closed, exparel  infused as block and wound irrigated.  Overlying skin closed with staples under minimal tension, and then retention sutures tied down after placing bolsters.   Entire wound then dressed with honey comb dressing  The patient tolerated the procedure well and was taken to the postanesthesia care unit in satisfactory condition. NG placed by anesthesiain preop still remains in place

## 2023-09-17 ENCOUNTER — Encounter: Payer: Self-pay | Admitting: Surgery

## 2023-09-17 DIAGNOSIS — J9601 Acute respiratory failure with hypoxia: Secondary | ICD-10-CM | POA: Diagnosis not present

## 2023-09-17 LAB — GLUCOSE, CAPILLARY
Glucose-Capillary: 88 mg/dL (ref 70–99)
Glucose-Capillary: 77 mg/dL (ref 70–99)
Glucose-Capillary: 69 mg/dL — ABNORMAL LOW (ref 70–99)
Glucose-Capillary: 66 mg/dL — ABNORMAL LOW (ref 70–99)
Glucose-Capillary: 73 mg/dL (ref 70–99)
Glucose-Capillary: 59 mg/dL — ABNORMAL LOW (ref 70–99)
Glucose-Capillary: 74 mg/dL (ref 70–99)

## 2023-09-17 LAB — BASIC METABOLIC PANEL
Anion gap: 10 (ref 5–15)
BUN: 20 mg/dL (ref 8–23)
CO2: 24 mmol/L (ref 22–32)
Calcium: 8.3 mg/dL — ABNORMAL LOW (ref 8.9–10.3)
Chloride: 106 mmol/L (ref 98–111)
Creatinine, Ser: 0.94 mg/dL (ref 0.61–1.24)
GFR, Estimated: >60 mL/min (ref >60)
Glucose, Bld: 78 mg/dL (ref 70–99)
Potassium: 5.6 mmol/L — ABNORMAL HIGH (ref 3.5–5.1)
Sodium: 140 mmol/L (ref 135–145)

## 2023-09-17 LAB — CBC
HCT: 28 % — ABNORMAL LOW (ref 39.0–52.0)
Hemoglobin: 8.5 g/dL — ABNORMAL LOW (ref 13.0–17.0)
MCH: 28.5 pg (ref 26.0–34.0)
MCHC: 30.4 g/dL (ref 30.0–36.0)
MCV: 94 fL (ref 80.0–100.0)
Platelets: 159 10*3/uL (ref 150–400)
RBC: 2.98 MIL/uL — ABNORMAL LOW (ref 4.22–5.81)
RDW: 15.8 % — ABNORMAL HIGH (ref 11.5–15.5)
WBC: 8.1 10*3/uL (ref 4.0–10.5)
nRBC: 0 % (ref 0.0–0.2)

## 2023-09-17 MED ORDER — IPRATROPIUM-ALBUTEROL 0.5-2.5 (3) MG/3ML IN SOLN
3.0000 mL | Freq: Two times a day (BID) | RESPIRATORY_TRACT | Status: DC
  Administered 2023-09-17 – 2023-09-24 (×14): 3 mL via RESPIRATORY_TRACT
  Filled 2023-09-17 (×14): qty 3

## 2023-09-17 MED ORDER — DEXTROSE IN LACTATED RINGERS 5 % IV SOLN: INTRAVENOUS | Status: AC

## 2023-09-17 MED ORDER — SODIUM ZIRCONIUM CYCLOSILICATE 5 G PO PACK
5.0000 g | PACK | Freq: Once | ORAL | Status: AC
  Administered 2023-09-17: 5 g via ORAL
  Filled 2023-09-17: qty 1

## 2023-09-17 NOTE — Progress Notes (Signed)
 [facility] SURGICAL PROGRESS NOTE  Hospital Day(s): 14.   Post op day(s): 2 Days Post-Op.   Interval History:  Patient seen and examined No acute events or new complaints overnight.  Patient reports he is doing okay; abdominal soreness expectedly - worse with coughing Cognitively he looks the best I've seen  No fever, chills, nausea, emesis  Continues without leukocytosis; WBC 8.1K Hgb to 8.5 sCr - 0.94; UO - 1600 ccs Hyperkalemia to 5.6  NGT back in; no output recorded and non in canister Colostomy with gas and stool in bag; unmeasured   Vital signs in last 24 hours: [min-max] current  Temp:  [97.5 F (36.4 C)-98.2 F (36.8 C)] 97.6 F (36.4 C) (01/13 0628) Pulse Rate:  [71-90] 78 (01/13 0628) Resp:  [16-19] 18 (01/13 0628) BP: (114-143)/(69-84) 125/84 (01/13 0628) SpO2:  [85 %-100 %] 96 % (01/13 0628) Weight:  [96 kg] 96 kg (01/13 0248)     Height: 5' 11 (180.3 cm) Weight: 96 kg BMI (Calculated): 29.53   Intake/Output last 2 shifts:  01/12 0701 - 01/13 0700 In: 360 [NG/GT:360] Out: 750 [Urine:750]   Physical Exam:  Constitutional: alert, cooperative and no distress  HEENT: NGT in place; clamped currently, no output at time of my evaluation  Respiratory: breathing non-labored at rest Cardiovascular: regular rate and sinus rhythm  Gastrointestinal: soft, incisional soreness, and non-distended. No rebound/guarding. 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, lots of gas and stool in bag  Genitourinary: Condom catheter in place  Integumentary: Laparotomy is CDI with staples and now with retention sutures, honeycomb in place, serous drainage present   Labs:     Latest Ref Rng & Units 09/17/2023    5:25 AM 09/16/2023    6:40 AM 09/15/2023    6:40 AM  CBC  WBC 4.0 - 10.5 K/uL 8.1  9.9  11.3   Hemoglobin 13.0 - 17.0 g/dL 8.5  9.0  9.6   Hematocrit 39.0 - 52.0 % 28.0  29.2  31.2   Platelets 150 - 400 K/uL 159  173  192       Latest Ref Rng & Units 09/17/2023     5:25 AM 09/16/2023    7:56 AM 09/15/2023    6:40 AM  CMP  Glucose 70 - 99 mg/dL 78  870  860   BUN 8 - 23 mg/dL 20  21  25    Creatinine 0.61 - 1.24 mg/dL 9.05  9.07  9.12   Sodium 135 - 145 mmol/L 140  134  137   Potassium 3.5 - 5.1 mmol/L 5.6  6.0  5.4   Chloride 98 - 111 mmol/L 106  104  106   CO2 22 - 32 mmol/L 24  24  25    Calcium  8.9 - 10.3 mg/dL 8.3  8.2  8.4      Imaging studies: No new pertinent imaging studies   Assessment/Plan: 73 y.o. male 2 Days Post-Op closure of wound dehiscence with rentention sutures s/p initial exploratory laparotomy, LOA, and colostomy revision on 01/07   - He is having bowel function and NGT is not recorded but appears minimal, to none, this AM. We can check residuals today before determining removal and potentially restarting CLD   - Continue TPN; monitor electrolytes - may be able to start weaning once on soft diet  - Monitor H&H; stable  - Monitor abdominal examination - Monitor colostomy output; record - Pain control prn; antiemetics prn - Further management per primary service; we will follow  All of the above findings and recommendations were discussed with the patient, and the medical team, and all of patient's questions were answered to his expressed satisfaction.  -- Arthea Platt, PA-C [facility] 09/17/2023, 8:24 AM M-F: 7am - 4pm

## 2023-09-17 NOTE — Progress Notes (Signed)
 Patient resting in bed with eyes closed. No needs identified.

## 2023-09-17 NOTE — Progress Notes (Signed)
 Physical Therapy Treatment Patient Details Name: Calvin [name] MRN: [id number] DOB: 1951/03/08 Today's Date: 09/17/2023   History of Present Illness Pt is a 73 y.o. male presenting to hospital from nursing facility for abdominal pain, N/V, fever, and hypoxia.  Pt admitted with severe sepsis, acute hypoxic respiratory failure, SBO, AKI on CKD stage II, colostomy prolapse.  (+) Flu.  Hospital course significant for exploratory laparotomy, colostomy revision and LOA (1/7); abdominal wound dehiscence/repair (1/11).  PMH includes h/o CVA with L sided deficitis, COPD, colostomy, seizure disorder, chronic combined HFrEF and HFpEF, CKD, htn, colon CA s/p transverse colectomy and colostomy, L partial hip replacement, throat sx (d/t CA), L CTR.    PT Comments  Patient initially declining session, but agreeable to light activity with encouragement.  Participated with full set of UE/LE therex, act assist, demonstrating active movement (at least 3-/5) in all joints.  Max/total assist for repositioning and partial rolling in bed; refused further activity/mobility progression.  Easily frustrated/agitated with continued encouragement. Transitioned to chair position in bed end of session; RN at bedside for additional care.     If plan is discharge home, recommend the following: Two people to help with walking and/or transfers;Two people to help with bathing/dressing/bathroom;Assistance with cooking/housework;Assist for transportation;[name]   Can travel by private vehicle     No  Equipment Recommendations       Recommendations for Other Services       Precautions / Restrictions Precautions Precautions: Fall Precaution Comments: Clear liquids; LUQ colostomy Restrictions Weight Bearing Restrictions Per Provider Order: No     Mobility  Bed Mobility Overal bed mobility: Needs Assistance Bed Mobility: Rolling Rolling: Max assist, Total assist         General bed  mobility comments: partial rolling for improved midline positioning (tends to list R)    Transfers                   General transfer comment: patient refused due to abdominal pain    Ambulation/Gait               General Gait Details: patient refused due to abdominal pain   Stairs             Wheelchair Mobility     Tilt Bed    Modified Rankin (Stroke Patients Only)       Balance                                            Cognition Arousal: Alert Behavior During Therapy: Flat affect Overall Cognitive Status: No family/caregiver present to determine baseline cognitive functioning                                 General Comments: Oriented to person, place and general situation; follows simple commands, does require encouragement throughout session.  Easily frustrated, agitated throughout session        Exercises Other Exercises Other Exercises: Act assist ROM to bilat UE/LEs, 1x12 for general strength/flexibility; grossly at least 3-/5 throughout.  Requires continuous encouragement throughout; declines activity beyond bed-level therex Other Exercises: Did transition to chair position in bed for improved upright, pressure redistribution; patient tolerating fairly.    General Comments        Pertinent Vitals/Pain Pain Assessment Pain  Assessment: Faces Faces Pain Scale: Hurts little more Pain Location: abdomen Pain Descriptors / Indicators: Discomfort, Aching, Grimacing, Guarding Pain Intervention(s): Limited activity within patient's tolerance, Monitored during session, Premedicated before session, Repositioned    Home Living                          Prior Function            PT Goals (current goals can now be found in the care plan section) Acute Rehab PT Goals Patient Stated Goal: to make the pain better, to be able to work on sitting/standing in therapy again PT Goal Formulation: With  patient Time For Goal Achievement: 09/27/23 Potential to Achieve Goals: Fair Progress towards PT goals: Progressing toward goals    Frequency    Min 1X/week      PT Plan      Co-evaluation              AM-PAC PT 6 Clicks Mobility   Outcome Measure  Help needed turning from your back to your side while in a flat bed without using bedrails?: Total Help needed moving from lying on your back to sitting on the side of a flat bed without using bedrails?: Total Help needed moving to and from a bed to a chair (including a wheelchair)?: Total Help needed standing up from a chair using your arms (e.g., wheelchair or bedside chair)?: Total Help needed to walk in hospital room?: Total Help needed climbing 3-5 steps with a railing? : Total 6 Click Score: 6    End of Session Equipment Utilized During Treatment: Oxygen Activity Tolerance: Patient limited by pain Patient left: in bed;with call bell/phone within reach;with bed alarm set;with nursing/sitter in room Nurse Communication: Mobility status PT Visit Diagnosis: Other abnormalities of gait and mobility (R26.89);Muscle weakness (generalized) (M62.81);Pain     Time: 1206-1225 PT Time Calculation (min) (ACUTE ONLY): 19 min  Charges:    $Therapeutic Exercise: 8-22 mins PT General Charges $$ ACUTE PT VISIT: 1 Visit                    Jennalyn Cawley H. Delores, PT, DPT, NCS 09/17/23, 1:21 PM [phone]

## 2023-09-17 NOTE — TOC Progression Note (Signed)
 Transition of Care [facility]) - Progression Note    Patient Details  Name: [name] MRN: [id number] Date of Birth: Apr 06, 1951  [facility]) CM/SW Contact  Trejuan Matherne A Jaimen Melone, RN Phone Number: 09/17/2023, 11:57 AM  Clinical Narrative:    Chart reviewed.  Noted that patient 2 Days Post-Op closure of wound dehiscence with rentention sutures s/p initial exploratory laparotomy, LOA, and colostomy revision on 01/07.  Patient now having bowel function and minimal to none NG tube drainage. Patient continues to be on TPN.    TOC will continue to follow for discharge planning.      Expected Discharge Plan: Long Term Nursing Home Barriers to Discharge: Continued Medical Work up  Expected Discharge Plan and Services                                               Social Determinants of Health (SDOH) Interventions SDOH Screenings   Food Insecurity: No Food Insecurity (09/11/2023)  Housing: Low Risk  (09/11/2023)  Transportation Needs: No Transportation Needs (09/11/2023)  Utilities: Not At Risk (09/11/2023)  Social Connections: Unknown (09/11/2023)  Tobacco Use: Medium Risk (09/03/2023)    Readmission Risk Interventions    08/20/2023   12:32 PM 07/17/2023   12:24 PM 06/30/2022    2:25 PM  Readmission Risk Prevention Plan  Transportation Screening Complete Complete Complete  PCP or Specialist Appt within 3-5 Days  Complete   Social Work Consult for Recovery Care Planning/Counseling  Complete   Palliative Care Screening  Not Applicable   Medication Review Oceanographer) Complete Complete   PCP or Specialist appointment within 3-5 days of discharge Complete    HRI or Home Care Consult Complete  Complete  SW Recovery Care/Counseling Consult Complete  Complete  Palliative Care Screening Not Applicable  Complete  Skilled Nursing Facility Complete  Complete

## 2023-09-17 NOTE — Progress Notes (Signed)
 PROGRESS NOTE    Calvin [name]   FMW:[id number] DOB: 1951/08/16  DOA: 09/03/2023 Date of Service: 09/17/23 which is hospital day 14  PCP: Laurine Gladden, MD    Hospital course / significant events:   HPI: Calvin [name] is a 73 y.o. 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   12/30: admitted to hospitalist sepsis, AKI, (+)Influenza A, likely aspiration pneumonia, acute hypoxic resp fail, SBO. NG placed, surgical consult.  12/31-01/06: relatively stable, remains SBO w/ NG and on TPN, worsening colostomy prolapse. Anemia w/o overt GIB, holding anti-plt/anti-cogs, GI no plans for endoscopic eval at this time 01/07: exlap, LOA, and colostomy revision  01/08: stable.  01/09: d/c NG 01/10: advancing diet as able, remains on TPN. Hgb 6.8 - 1 unit PRBC given  01/11: persistent abd pain and cough, imaging concerning for abdominal surgical wound dehiscence deeper layers, going back to OR. Continue w/ cough suppression, await over-read but no obvious CT chest findings, suspect post-viral bronchitis  01/12: to OT early AM for closure of wound dehiscence. Postop stable, NG in place, clamped today. CT chest question pneumonia but pt reports coughing improved, suspect was mostly reflux, will continue to monitor cough. Postop care per general surgery include clamp NG and remain NPO for now   01/13: NG out, CLD   Consultants:  General surgery   Procedures/Surgeries: 09/11/23: exlap, LOA, and colostomy revision  09/16/23: closure of wound dehiscence      ASSESSMENT & PLAN:   Flu A CAP improving, s/p tamiflu  and abx for CAP/aspiration No clearly worsening pneumonia on CT despite cough, see  below, no indication for abx for pneumonitis, hold abx   SBO vs ileus History ogilvie syndrome s/p colostomy. s/p ex-lab with lysis of adhesions on 1/7 S/p wound dehiscence repair 1/12 NG out, CLD General surgery following   Cough, multifactorial Postviral/post-infectious component is likely Given CT findings of esophageal fluid/dilation, suspect reflux and/or aspiration pneumonitis is also likely especially since improvement w/ surgery and re-placement of NG Hx COPD but no apparent exacerbation No clearly worsening pneumonia, no indication for abx for pneumonitis, hold abx  Monitor sirs/sepsis parameters  Cough suppressants  PPI  Acute hypoxic respiratory failure Due to above  wean [place] O2 as able   Anemia continue bid ppi hold asa/heparin  for now can probably resume tomorrow  trend hgb/CBC transfuse for Hgb <7   Colostomy irritation/prolapse gen surg following  mild bleeding from irritate/edematous tissue, ok to monitor    COPD No acute exacerbation S/p 6 days prednisone  Nebs as needed   AKI on ckd 2 resolved Prerenal 2/2 dehydration monitor BMP   Seizure disorder With recent hospitalization for breakthrough seizures continue keppra , home lamictal  and pregabalin    hx CVA  With hemiplegia hold asa given anemia/bleeding risk as above   BPH  cont home flomax    Chronic HFpEF Dehydrated as above judicious IV fluids if needed   Debility Resides at snf PT at LTC     DVT prophylaxis: held given anemia IV fluids: no continuous IV fluids  Nutrition: CLD advance per general surgery  Central lines / invasive devices: NG, central line, PICC   Code Status: FULL CODE ACP documentation reviewed:  copy of DNR and MOST on file in [place],  DNR was voided dated 2021, MOST notes FULL CODE dated 2023 do DNR removed   TOC needs: will need to arrange back to LTC when dc appropriate, outpatint PT at facility  Barriers to dispo / significant pending items: advancing diet,  surgical clearance prior to dc              Subjective / Brief ROS:  Patient reports no concerns today, happy NG is out, pain is controlled, cough is improved / rare  Family Communication: none at this time      Objective Findings:  Vitals:   09/16/23 2137 09/17/23 0038 09/17/23 0248 09/17/23 0628  BP:  114/83  125/84  Pulse: 78 71  78  Resp:  19  18  Temp:  97.7 F (36.5 C)  97.6 F (36.4 C)  TempSrc:      SpO2: 99% 91%  96%  Weight:   96 kg   Height:        Intake/Output Summary (Last 24 hours) at 09/17/2023 1348 Last data filed at 09/17/2023 1007 Gross per 24 hour  Intake 360 ml  Output 1050 ml  Net -690 ml   Filed Weights   09/13/23 0342 09/16/23 0500 09/17/23 0248  Weight: 96.5 kg 99.1 kg 96 kg    Examination:  Physical Exam Constitutional:      General: He is not in acute distress. Cardiovascular:     Rate and Rhythm: Normal rate and regular rhythm.  Pulmonary:     Effort: No respiratory distress.     Breath sounds: Rhonchi (scattered coarse breath sounds) present.  Abdominal:     General: A surgical scar is present. Bowel sounds are normal. There is distension (much improved from yesterday).     Palpations: Abdomen is soft.     Tenderness: There is no abdominal tenderness (incision site, less severe compared to yesterday).     Comments: Ostomy prolapse w/ some irritated friable appearing tissue, scant red thin serous bloody liquid in ostomy bag no bloody stool   Skin:    General: Skin is warm and dry.  Neurological:     Mental Status: He is alert.  Psychiatric:        Mood and Affect: Mood normal.        Behavior: Behavior normal.          Scheduled Medications:   acetaminophen   650 mg Oral Q6H   baclofen   5 mg Oral BID   benzonatate   200 mg Oral TID   budesonide  (PULMICORT ) nebulizer solution  0.25 mg Nebulization BID   Chlorhexidine  Gluconate Cloth  6 each Topical Daily   insulin  aspart  0-9 Units Subcutaneous Q8H    ipratropium-albuterol   3 mL Inhalation BID   lactulose   20 g Oral Daily   lamoTRIgine   50 mg Oral BID   levETIRAcetam   1,000 mg Oral BID   melatonin  10 mg Oral QHS   mometasone -formoterol   2 puff Inhalation BID   pantoprazole  (PROTONIX ) IV  40 mg Intravenous Q12H   polyethylene glycol  17 g Oral BID   pregabalin   200 mg Oral QHS   sodium chloride  flush  10-40 mL Intracatheter Q12H   tamsulosin   0.4 mg Oral QPM    Continuous Infusions:    PRN Medications:  HYDROcodone  bit-homatropine, HYDROmorphone  (DILAUDID ) injection, LORazepam , metoCLOPramide  (REGLAN ) injection, oxyCODONE , senna, sodium chloride  flush  Antimicrobials from admission:  Anti-infectives (From admission, onward)    Start     Dose/Rate Route Frequency Ordered Stop  09/11/23 2000  cefoTEtan  (CEFOTAN ) 2 g in sodium chloride  0.9 % 100 mL IVPB        2 g 200 mL/hr over 30 Minutes Intravenous Every 8 hours 09/11/23 1801 09/12/23 1910   09/11/23 0600  cefoTEtan  (CEFOTAN ) 2 g in sodium chloride  0.9 % 100 mL IVPB        2 g 200 mL/hr over 30 Minutes Intravenous On call to O.R. 09/10/23 0858 09/12/23 1910   09/04/23 1200  Ampicillin -Sulbactam (UNASYN ) 3 g in sodium chloride  0.9 % 100 mL IVPB  Status:  Discontinued        3 g 200 mL/hr over 30 Minutes Intravenous Every 6 hours 09/04/23 1052 09/08/23 1537   09/03/23 1800  ceFEPIme  (MAXIPIME ) 2 g in sodium chloride  0.9 % 100 mL IVPB  Status:  Discontinued        2 g 200 mL/hr over 30 Minutes Intravenous Every 12 hours 09/03/23 0812 09/04/23 0903   09/03/23 1500  metroNIDAZOLE  (FLAGYL ) IVPB 500 mg  Status:  Discontinued        500 mg 100 mL/hr over 60 Minutes Intravenous Every 12 hours 09/03/23 0758 09/04/23 0903   09/03/23 1000  oseltamivir  (TAMIFLU ) capsule 30 mg        30 mg Oral 2 times daily 09/03/23 0757 09/08/23 0959   09/03/23 0315  ceFEPIme  (MAXIPIME ) 2 g in sodium chloride  0.9 % 100 mL IVPB        2 g 200 mL/hr over 30 Minutes Intravenous  Once 09/03/23 0304  09/03/23 0441   09/03/23 0315  metroNIDAZOLE  (FLAGYL ) IVPB 500 mg        500 mg 100 mL/hr over 60 Minutes Intravenous  Once 09/03/23 0304 09/03/23 0441   09/03/23 0315  vancomycin  (VANCOCIN ) IVPB 1000 mg/200 mL premix        1,000 mg 200 mL/hr over 60 Minutes Intravenous  Once 09/03/23 0304 09/03/23 0441           Data Reviewed:  I have personally reviewed the following...  CBC: Recent Labs  Lab 09/13/23 0506 09/14/23 0635 09/15/23 0640 09/16/23 0640 09/17/23 0525  WBC 6.2 9.2 11.3* 9.9 8.1  HGB 8.1* 6.8* 9.6* 9.0* 8.5*  HCT 26.7* 22.6* 31.2* 29.2* 28.0*  MCV 98.5 97.4 95.1 93.6 94.0  PLT 176 164 192 173 159   Basic Metabolic Panel: Recent Labs  Lab 09/11/23 0556 09/12/23 0500 09/12/23 0530 09/13/23 0506 09/15/23 0640 09/16/23 0756 09/17/23 0525  NA 140  --  141 143 137 134* 140  K 4.1  --  4.6 4.2 5.4* 6.0* 5.6*  CL 103  --  105 109 106 104 106  CO2 26  --  26 26 25 24 24   GLUCOSE 108*  --  150* 119* 139* 129* 78  BUN 19  --  24* 26* 25* 21 20  CREATININE 0.92  --  1.08 1.08 0.87 0.92 0.94  CALCIUM  8.4*  --  8.0* 8.1* 8.4* 8.2* 8.3*  MG 1.9  --  1.9 2.3  --   --   --   PHOS 5.2* 4.4  --  4.2  --   --   --    GFR: Estimated Creatinine Clearance: 81.5 mL/min (by C-G formula based on SCr of 0.94 mg/dL). Liver Function Tests: Recent Labs  Lab 09/11/23 0556 09/12/23 0530 09/13/23 0506  AST 33 20 18  ALT 58* 38 35  ALKPHOS 51 44 47  BILITOT 0.6 0.3 0.5  PROT 5.6* 5.2* 5.8*  ALBUMIN  2.5* 2.5* 2.6*   No results for input(s): LIPASE, AMYLASE in the last 168 hours. No results for input(s): AMMONIA in the last 168 hours. Coagulation Profile: No results for input(s): INR, PROTIME in the last 168 hours. Cardiac Enzymes: No results for input(s): CKTOTAL, CKMB, CKMBINDEX, TROPONINI in the last 168 hours. BNP (last 3 results) No results for input(s): PROBNP in the last 8760 hours. HbA1C: No results for input(s): HGBA1C in the last 72  hours. CBG: Recent Labs  Lab 09/17/23 0114 09/17/23 0625 09/17/23 0822 09/17/23 1214 09/17/23 1228  GLUCAP 88 77 69* 73 66*   Lipid Profile: No results for input(s): CHOL, HDL, LDLCALC, TRIG, CHOLHDL, LDLDIRECT in the last 72 hours. Thyroid  Function Tests: No results for input(s): TSH, T4TOTAL, FREET4, T3FREE, THYROIDAB in the last 72 hours. Anemia Panel: No results for input(s): VITAMINB12, FOLATE, FERRITIN, TIBC, IRON, RETICCTPCT in the last 72 hours. Most Recent Urinalysis On File:     Component Value Date/Time   COLORURINE AMBER (A) 09/03/2023 0543   APPEARANCEUR CLOUDY (A) 09/03/2023 0543   APPEARANCEUR Clear 08/14/2014 0933   LABSPEC 1.026 09/03/2023 0543   LABSPEC 1.015 08/14/2014 0933   PHURINE 5.0 09/03/2023 0543   GLUCOSEU NEGATIVE 09/03/2023 0543   GLUCOSEU Negative 08/14/2014 0933   HGBUR NEGATIVE 09/03/2023 0543   BILIRUBINUR NEGATIVE 09/03/2023 0543   BILIRUBINUR Negative 08/14/2014 0933   KETONESUR NEGATIVE 09/03/2023 0543   PROTEINUR 30 (A) 09/03/2023 0543   UROBILINOGEN 0.2 06/12/2010 0103   NITRITE NEGATIVE 09/03/2023 0543   LEUKOCYTESUR LARGE (A) 09/03/2023 0543   LEUKOCYTESUR Negative 08/14/2014 0933   Sepsis Labs: @LABRCNTIP (procalcitonin:4,lacticidven:4) Microbiology: No results found for this or any previous visit (from the past 240 hours).     Radiology Studies last 3 days: CT CHEST ABDOMEN PELVIS W CONTRAST Result Date: 09/15/2023 CLINICAL DATA:  Abdominal pain, postop. Respiratory distress. Electronic records indicates recent laparotomy with lysis of adhesions and colostomy revision. EXAM: CT CHEST, ABDOMEN, AND PELVIS WITH CONTRAST TECHNIQUE: Multidetector CT imaging of the chest, abdomen and pelvis was performed following the standard protocol during bolus administration of intravenous contrast. RADIATION DOSE REDUCTION: This exam was performed according to the departmental dose-optimization program which  includes automated exposure control, adjustment of the mA and/or kV according to patient size and/or use of iterative reconstruction technique. CONTRAST:  OMNIPAQUE  IOHEXOL  300 MG/ML  SOLN COMPARISON:  Abdominopelvic CT 09/06/2023 FINDINGS: CT CHEST FINDINGS Cardiovascular: Right internal jugular central line tip is in the right atrium. Right upper extremity PICC tip is in the mid SVC. The heart is mildly enlarged. There are coronary artery calcifications. Aortic atherosclerosis without aneurysm or acute aortic findings. Mediastinum/Nodes: The esophagus is diffusely dilated and fluid-filled to the thoracic inlet. No mediastinal or hilar adenopathy. Lungs/Pleura: Right lower lobe consolidation with central air bronchograms, similar to prior. There are adjacent nodular densities in the right middle lobe. Dependent density in the left lower lobe is similar. Small right and trace left pleural effusion. Breathing motion artifact limits detailed assessment. No features of pulmonary edema. No intraluminal debris. Musculoskeletal: Avascular necrosis of both humeral heads. Subchondral collapse on the left. Remote medial left clavicle fracture. CT ABDOMEN PELVIS FINDINGS Hepatobiliary: No focal liver lesion. Punctate hepatic granuloma. Layering gallstones in the gallbladder. No pericholecystic inflammation. There is no biliary dilatation. Pancreas: No ductal dilatation or inflammation. Spleen: Normal in size without focal abnormality. Adrenals/Urinary Tract: Unchanged bilateral adrenal nodules, adenomas based on prior imaging. No imaging follow-up is recommended. Stable lobulated renal contours of  both kidneys. No hydronephrosis. No suspicious renal lesion. There is mild diffuse bladder wall thickening. Stomach/Bowel: The stomach is prominently distended with air and fluid. Dilated fluid-filled duodenum and small bowel. There is no discrete small bowel transition point. Midline ventral abdominal wall hernia contains  loops of small bowel, without associated small bowel wall thickening. Normal appendix is visualized. Fluid/liquid stool within the cecum and ascending colon. Transverse colon is decompressed. Left lower quadrant colostomy. There is retained barium within the excluded descending and sigmoid colon. Vascular/Lymphatic: Aortic and branch atherosclerosis. No aneurysm. The portal vein is patent. No enlarged lymph nodes in the abdomen or pelvis. Reproductive: Unremarkable prostate. Other: Laparotomy changes with midline skin staples. Rectus diastasis with midline ventral abdominal wall hernia containing small bowel that is not inflamed. There is generalized body wall edema. No significant ascites or free fluid. No abdominopelvic collection. No free air. Musculoskeletal: Left hip arthroplasty. There are no acute or suspicious osseous abnormalities. IMPRESSION: 1. Diffusely dilated fluid-filled esophagus to the thoracic inlet. The stomach is prominently distended with air and fluid. Dilated fluid-filled duodenum and small bowel. No discrete small bowel transition point. Favor postoperative ileus, although recurrent obstruction could have a similar appearance. 2. Left lower quadrant colostomy. Fluid/liquid stool within the cecum and ascending colon. 3. Dependent densities in the lower lobes, right greater than left. Findings may represent atelectasis or pneumonia, including aspiration. Adjacent nodular airspace disease in the right middle lobe is suspicious for pneumonia. 4. Small right and trace left pleural effusions. 5. Cholelithiasis. 6. Avascular necrosis of both humeral heads with subchondral collapse on the left. 7. Generalized body wall edema. Aortic Atherosclerosis (ICD10-I70.0). Electronically Signed   By: Andrea Gasman M.D.   On: 09/15/2023 17:30   DG Abd 2 Views Result Date: 09/13/2023 CLINICAL DATA:  Abdominal pain, bleeding EXAM: ABDOMEN - 2 VIEW COMPARISON:  09/10/2023 FINDINGS: Central line extends to the  mid right atrium. The gastric tube is been removed. Skin staples over the right lower abdomen. Stomach and small bowel decompressed. Residual contrast in the nondistended colon. Left lower quadrant ostomy device. Left hip arthroplasty components partially visualized. IMPRESSION: Nonobstructive bowel gas pattern. Electronically Signed   By: JONETTA Faes M.D.   On: 09/13/2023 19:25    Time spent 50 min     Espyn Radwan, DO [facility] 09/17/2023, 1:48 PM    Dictation software may have been used to generate the above note. Typos may occur and escape review in typed/dictated notes. Please contact Dr Marsa directly for clarity if needed.  Staff may message me via secure chat in Epic  but this may not receive an immediate response,  please page me for urgent matters!  If 7PM-7AM, please contact night coverage [email or web address]

## 2023-09-17 NOTE — Anesthesia Postprocedure Evaluation (Signed)
 Anesthesia Post Note  Patient: Calvin [name]  Procedure(s) Performed: EXPLORATORY LAPAROTOMY  Patient location during evaluation: PACU Anesthesia Type: General Level of consciousness: awake and alert Pain management: pain level controlled Vital Signs Assessment: post-procedure vital signs reviewed and stable Respiratory status: spontaneous breathing, nonlabored ventilation, respiratory function stable and patient connected to nasal cannula oxygen Cardiovascular status: blood pressure returned to baseline and stable Postop Assessment: no apparent nausea or vomiting Anesthetic complications: no   No notable events documented.   Last Vitals:  Vitals:   09/17/23 0038 09/17/23 0628  BP: 114/83 125/84  Pulse: 71 78  Resp: 19 18  Temp: 36.5 C 36.4 C  SpO2: 91% 96%    Last Pain:  Vitals:   09/17/23 0544  TempSrc:   PainSc: 9                  Rome Ade

## 2023-09-17 NOTE — Plan of Care (Signed)
  Problem: Pain Management: Goal: General experience of comfort will improve Outcome: Progressing   Problem: Safety: Goal: Ability to remain free from injury will improve Outcome: Progressing   Problem: Activity: Goal: Ability to tolerate increased activity will improve Outcome: Progressing

## 2023-09-18 DIAGNOSIS — J9601 Acute respiratory failure with hypoxia: Secondary | ICD-10-CM | POA: Diagnosis not present

## 2023-09-18 LAB — GLUCOSE, CAPILLARY
Glucose-Capillary: 100 mg/dL — ABNORMAL HIGH (ref 70–99)
Glucose-Capillary: 109 mg/dL — ABNORMAL HIGH (ref 70–99)
Glucose-Capillary: 77 mg/dL (ref 70–99)
Glucose-Capillary: 85 mg/dL (ref 70–99)
Glucose-Capillary: 97 mg/dL (ref 70–99)

## 2023-09-18 LAB — CBC
HCT: 32.3 % — ABNORMAL LOW (ref 39.0–52.0)
Hemoglobin: 10 g/dL — ABNORMAL LOW (ref 13.0–17.0)
MCH: 29 pg (ref 26.0–34.0)
MCHC: 31 g/dL (ref 30.0–36.0)
MCV: 93.6 fL (ref 80.0–100.0)
Platelets: 209 10*3/uL (ref 150–400)
RBC: 3.45 MIL/uL — ABNORMAL LOW (ref 4.22–5.81)
RDW: 15.2 % (ref 11.5–15.5)
WBC: 7.2 10*3/uL (ref 4.0–10.5)
nRBC: 0 % (ref 0.0–0.2)

## 2023-09-18 LAB — BASIC METABOLIC PANEL
Anion gap: 9 (ref 5–15)
BUN: 13 mg/dL (ref 8–23)
CO2: 24 mmol/L (ref 22–32)
Calcium: 8.3 mg/dL — ABNORMAL LOW (ref 8.9–10.3)
Chloride: 103 mmol/L (ref 98–111)
Creatinine, Ser: 0.99 mg/dL (ref 0.61–1.24)
GFR, Estimated: >60 mL/min (ref >60)
Glucose, Bld: 74 mg/dL (ref 70–99)
Potassium: 4.7 mmol/L (ref 3.5–5.1)
Sodium: 136 mmol/L (ref 135–145)

## 2023-09-18 MED ORDER — PREGABALIN 50 MG PO CAPS
100.0000 mg | ORAL_CAPSULE | Freq: Every day | ORAL | Status: DC
  Administered 2023-09-18 – 2023-09-20 (×3): 100 mg via ORAL
  Filled 2023-09-18 (×4): qty 2

## 2023-09-18 MED ORDER — ADULT MULTIVITAMIN W/MINERALS CH
1.0000 | ORAL_TABLET | Freq: Every day | ORAL | Status: DC
Start: 2023-09-18 — End: 2023-09-21
  Administered 2023-09-18 – 2023-09-20 (×3): 1 via ORAL
  Filled 2023-09-18 (×3): qty 1

## 2023-09-18 MED ORDER — HYDROMORPHONE HCL 1 MG/ML IJ SOLN
0.5000 mg | INTRAMUSCULAR | Status: DC | PRN
  Administered 2023-09-18: 0.5 mg via INTRAVENOUS
  Filled 2023-09-18: qty 0.5

## 2023-09-18 MED ORDER — ACETAMINOPHEN 325 MG PO TABS
650.0000 mg | ORAL_TABLET | Freq: Four times a day (QID) | ORAL | Status: DC
  Administered 2023-09-18 – 2023-09-20 (×5): 650 mg via ORAL
  Filled 2023-09-18 (×6): qty 2

## 2023-09-18 MED ORDER — ENOXAPARIN SODIUM 40 MG/0.4ML IJ SOSY
40.0000 mg | PREFILLED_SYRINGE | INTRAMUSCULAR | Status: DC
  Administered 2023-09-18 – 2023-09-20 (×3): 40 mg via SUBCUTANEOUS
  Filled 2023-09-18 (×3): qty 0.4

## 2023-09-18 MED ORDER — OXYCODONE HCL 5 MG PO TABS
5.0000 mg | ORAL_TABLET | ORAL | Status: DC | PRN
  Administered 2023-09-18 – 2023-09-19 (×4): 10 mg via ORAL
  Filled 2023-09-18 (×3): qty 2

## 2023-09-18 MED ORDER — NEPRO/CARBSTEADY PO LIQD
237.0000 mL | Freq: Three times a day (TID) | ORAL | Status: DC
Start: 2023-09-18 — End: 2023-09-21
  Administered 2023-09-18 – 2023-09-20 (×6): 237 mL via ORAL

## 2023-09-18 NOTE — Progress Notes (Addendum)
 PROGRESS NOTE    Calvin [name]   FMW:[id number] DOB: 1951/06/20  DOA: 09/03/2023 Date of Service: 09/18/23 which is hospital day 15  PCP: Laurine Gladden, MD    Hospital course / significant events:   HPI: Calvin [name] is a 73 y.o. 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   12/30: admitted to hospitalist sepsis, AKI, (+)Influenza A, likely aspiration pneumonia, acute hypoxic resp fail, SBO. NG placed, surgical consult.  12/31-01/06: relatively stable, remains SBO w/ NG and on TPN, worsening colostomy prolapse. Anemia w/o overt GIB, holding anti-plt/anti-cogs, GI no plans for endoscopic eval at this time 01/07: exlap, LOA, and colostomy revision  01/08: stable.  01/09: d/c NG 01/10: advancing diet as able. Hgb 6.8 - 1 unit PRBC given  01/11: persistent abd pain and cough, imaging concerning for abdominal surgical wound dehiscence deeper layers, going back to OR. Continue w/ cough suppression, await over-read but no obvious CT chest findings, suspect post-viral bronchitis  01/12: to OR early AM for closure of wound dehiscence. Postop stable, NG in place. CT chest question pneumonia but pt reports coughing improved, suspect was mostly reflux, will continue to monitor cough. Postop care per general surgery include clamp NG and remain NPO for now   01/13: NG out, CLD 01/14: FLD, continue advancing diet tolerated    Consultants:  General surgery   Procedures/Surgeries: 09/11/23: exlap, LOA, and colostomy revision  09/16/23: closure of wound dehiscence      ASSESSMENT & PLAN:   Flu A CAP improving, s/p tamiflu  and abx for CAP/aspiration No clearly worsening pneumonia on CT despite  cough, see below, no indication for abx for pneumonitis, hold abx   SBO vs ileus History ogilvie syndrome s/p colostomy. s/p ex-lab with lysis of adhesions on 1/7 S/p wound dehiscence repair 1/12 NG out, FLD General surgery following  Advised pt goal for limiting/reducing opiates d/t goal avoid ileus / promote bowel function, family voices encouragement of this as well, work on reducing opiates --> today 01/14 made dilaudid  for breakthrough, oxy-APA 5-10 mg po q4h prn mod-severe, increased lyrica  from 200 mg at bedtime only to that plus lyrica  100 mg q AM  Cough, multifactorial Postviral/post-infectious component is likely Given CT findings of esophageal fluid/dilation, suspect reflux and/or aspiration pneumonitis is also likely especially since improvement w/ surgery and re-placement of NG Hx COPD but no apparent exacerbation No clearly worsening pneumonia, no indication for abx for pneumonitis, hold abx  Monitor sirs/sepsis parameters  Cough suppressants  PPI  Acute hypoxic respiratory failure Due to above  wean [place] O2 as able   Anemia continue bid ppi hold asa/heparin  for now can probably resume tomorrow  trend hgb/CBC transfuse for Hgb <7   Colostomy irritation/prolapse gen surg following  mild bleeding from irritate/edematous tissue, ok to monitor    COPD No acute exacerbation S/p 6 days prednisone  Nebs as needed   AKI on ckd 2 resolved Prerenal 2/2 dehydration monitor BMP   Seizure disorder With recent hospitalization for breakthrough seizures continue keppra , home lamictal  and pregabalin    hx CVA  With hemiplegia hold asa given anemia/bleeding risk as above   BPH  cont home flomax    Chronic HFpEF Dehydrated as above judicious IV fluids if needed   Debility  Resides at snf PT at LTC     DVT prophylaxis: SCD IV fluids: no continuous IV fluids  Nutrition: FLD advance per general surgery Central lines / invasive devices: NG, central line, PICC    Code Status: FULL CODE ACP documentation reviewed:  copy of DNR and MOST on file in [facility], DNR was voided dated 2021, MOST notes FULL CODE dated 2023 do DNR removed   TOC needs: will need to arrange back to LTC when dc appropriate, outpatint PT at facility  Barriers to dispo / significant pending items: advancing diet, surgical clearance prior to dc              Subjective / Brief ROS:  Patient reports he is due for pain meds  Worried to eat solids but tolerating clears, asks for pudding   Family Communication: family at bedside on rounds     Objective Findings:  Vitals:   09/18/23 0400 09/18/23 0500 09/18/23 0726 09/18/23 0800  BP: (!) 148/105   125/79  Pulse:    (!) 104  Resp: 20   18  Temp: 98.4 F (36.9 C)   98.4 F (36.9 C)  TempSrc:      SpO2: 99%  96% 95%  Weight:  93.6 kg    Height:        Intake/Output Summary (Last 24 hours) at 09/18/2023 1201 Last data filed at 09/18/2023 1100 Gross per 24 hour  Intake 120 ml  Output 1050 ml  Net -930 ml   Filed Weights   09/16/23 0500 09/17/23 0248 09/18/23 0500  Weight: 99.1 kg 96 kg 93.6 kg    Examination:  Physical Exam Constitutional:      General: He is not in acute distress. Cardiovascular:     Rate and Rhythm: Normal rate and regular rhythm.  Pulmonary:     Effort: No respiratory distress.     Breath sounds: Rhonchi (scattered coarse breath sounds) present.  Abdominal:     General: A surgical scar is present. Bowel sounds are normal. There is distension (much improved from prior exams but still full).     Palpations: Abdomen is soft.     Tenderness: There is no abdominal tenderness (incision site, less severe compared to yesterday).     Comments: Ostomy prolapse w/ some irritated friable appearing tissue, scant red thin serous bloody liquid in ostomy bag no bloody stool   Skin:    General: Skin is warm and dry.  Neurological:     Mental Status: He is alert.  Psychiatric:        Mood and  Affect: Mood normal.        Behavior: Behavior normal.          Scheduled Medications:   acetaminophen   650 mg Oral Q6H   baclofen   5 mg Oral BID   benzonatate   200 mg Oral TID   budesonide  (PULMICORT ) nebulizer solution  0.25 mg Nebulization BID   Chlorhexidine  Gluconate Cloth  6 each Topical Daily   feeding supplement (NEPRO CARB STEADY)  237 mL Oral TID BM   insulin  aspart  0-9 Units Subcutaneous Q8H   ipratropium-albuterol   3 mL Inhalation BID   lactulose   20 g Oral Daily   lamoTRIgine   50 mg Oral BID   levETIRAcetam   1,000 mg Oral BID   melatonin  10 mg Oral QHS   mometasone -formoterol   2 puff Inhalation BID   multivitamin with minerals  1 tablet Oral Daily   pantoprazole  (PROTONIX ) IV  40 mg  Intravenous Q12H   polyethylene glycol  17 g Oral BID   pregabalin   100 mg Oral Daily   pregabalin   200 mg Oral QHS   sodium chloride  flush  10-40 mL Intracatheter Q12H   tamsulosin   0.4 mg Oral QPM    Continuous Infusions:    PRN Medications:  HYDROcodone  bit-homatropine, HYDROmorphone  (DILAUDID ) injection, LORazepam , metoCLOPramide  (REGLAN ) injection, oxyCODONE , senna, sodium chloride  flush  Antimicrobials from admission:  Anti-infectives (From admission, onward)    Start     Dose/Rate Route Frequency Ordered Stop   09/11/23 2000  cefoTEtan  (CEFOTAN ) 2 g in sodium chloride  0.9 % 100 mL IVPB        2 g 200 mL/hr over 30 Minutes Intravenous Every 8 hours 09/11/23 1801 09/12/23 1910   09/11/23 0600  cefoTEtan  (CEFOTAN ) 2 g in sodium chloride  0.9 % 100 mL IVPB        2 g 200 mL/hr over 30 Minutes Intravenous On call to O.R. 09/10/23 0858 09/12/23 1910   09/04/23 1200  Ampicillin -Sulbactam (UNASYN ) 3 g in sodium chloride  0.9 % 100 mL IVPB  Status:  Discontinued        3 g 200 mL/hr over 30 Minutes Intravenous Every 6 hours 09/04/23 1052 09/08/23 1537   09/03/23 1800  ceFEPIme  (MAXIPIME ) 2 g in sodium chloride  0.9 % 100 mL IVPB  Status:  Discontinued        2 g 200 mL/hr  over 30 Minutes Intravenous Every 12 hours 09/03/23 0812 09/04/23 0903   09/03/23 1500  metroNIDAZOLE  (FLAGYL ) IVPB 500 mg  Status:  Discontinued        500 mg 100 mL/hr over 60 Minutes Intravenous Every 12 hours 09/03/23 0758 09/04/23 0903   09/03/23 1000  oseltamivir  (TAMIFLU ) capsule 30 mg        30 mg Oral 2 times daily 09/03/23 0757 09/08/23 0959   09/03/23 0315  ceFEPIme  (MAXIPIME ) 2 g in sodium chloride  0.9 % 100 mL IVPB        2 g 200 mL/hr over 30 Minutes Intravenous  Once 09/03/23 0304 09/03/23 0441   09/03/23 0315  metroNIDAZOLE  (FLAGYL ) IVPB 500 mg        500 mg 100 mL/hr over 60 Minutes Intravenous  Once 09/03/23 0304 09/03/23 0441   09/03/23 0315  vancomycin  (VANCOCIN ) IVPB 1000 mg/200 mL premix        1,000 mg 200 mL/hr over 60 Minutes Intravenous  Once 09/03/23 0304 09/03/23 0441           Data Reviewed:  I have personally reviewed the following...  CBC: Recent Labs  Lab 09/14/23 0635 09/15/23 0640 09/16/23 0640 09/17/23 0525 09/18/23 0437  WBC 9.2 11.3* 9.9 8.1 7.2  HGB 6.8* 9.6* 9.0* 8.5* 10.0*  HCT 22.6* 31.2* 29.2* 28.0* 32.3*  MCV 97.4 95.1 93.6 94.0 93.6  PLT 164 192 173 159 209   Basic Metabolic Panel: Recent Labs  Lab 09/12/23 0500 09/12/23 0530 09/12/23 0530 09/13/23 0506 09/15/23 0640 09/16/23 0756 09/17/23 0525 09/18/23 0437  NA  --  141   < > 143 137 134* 140 136  K  --  4.6   < > 4.2 5.4* 6.0* 5.6* 4.7  CL  --  105   < > 109 106 104 106 103  CO2  --  26   < > 26 25 24 24 24   GLUCOSE  --  150*   < > 119* 139* 129* 78 74  BUN  --  24*   < >  26* 25* 21 20 13   CREATININE  --  1.08   < > 1.08 0.87 0.92 0.94 0.99  CALCIUM   --  8.0*   < > 8.1* 8.4* 8.2* 8.3* 8.3*  MG  --  1.9  --  2.3  --   --   --   --   PHOS 4.4  --   --  4.2  --   --   --   --    < > = values in this interval not displayed.   GFR: Estimated Creatinine Clearance: 76.5 mL/min (by C-G formula based on SCr of 0.99 mg/dL). Liver Function Tests: Recent Labs  Lab  09/12/23 0530 09/13/23 0506  AST 20 18  ALT 38 35  ALKPHOS 44 47  BILITOT 0.3 0.5  PROT 5.2* 5.8*  ALBUMIN  2.5* 2.6*   No results for input(s): LIPASE, AMYLASE in the last 168 hours. No results for input(s): AMMONIA in the last 168 hours. Coagulation Profile: No results for input(s): INR, PROTIME in the last 168 hours. Cardiac Enzymes: No results for input(s): CKTOTAL, CKMB, CKMBINDEX, TROPONINI in the last 168 hours. BNP (last 3 results) No results for input(s): PROBNP in the last 8760 hours. HbA1C: No results for input(s): HGBA1C in the last 72 hours. CBG: Recent Labs  Lab 09/17/23 1228 09/17/23 1622 09/17/23 1756 09/17/23 2359 09/18/23 0801  GLUCAP 66* 59* 74 97 77   Lipid Profile: No results for input(s): CHOL, HDL, LDLCALC, TRIG, CHOLHDL, LDLDIRECT in the last 72 hours. Thyroid  Function Tests: No results for input(s): TSH, T4TOTAL, FREET4, T3FREE, THYROIDAB in the last 72 hours. Anemia Panel: No results for input(s): VITAMINB12, FOLATE, FERRITIN, TIBC, IRON, RETICCTPCT in the last 72 hours. Most Recent Urinalysis On File:     Component Value Date/Time   COLORURINE AMBER (A) 09/03/2023 0543   APPEARANCEUR CLOUDY (A) 09/03/2023 0543   APPEARANCEUR Clear 08/14/2014 0933   LABSPEC 1.026 09/03/2023 0543   LABSPEC 1.015 08/14/2014 0933   PHURINE 5.0 09/03/2023 0543   GLUCOSEU NEGATIVE 09/03/2023 0543   GLUCOSEU Negative 08/14/2014 0933   HGBUR NEGATIVE 09/03/2023 0543   BILIRUBINUR NEGATIVE 09/03/2023 0543   BILIRUBINUR Negative 08/14/2014 0933   KETONESUR NEGATIVE 09/03/2023 0543   PROTEINUR 30 (A) 09/03/2023 0543   UROBILINOGEN 0.2 06/12/2010 0103   NITRITE NEGATIVE 09/03/2023 0543   LEUKOCYTESUR LARGE (A) 09/03/2023 0543   LEUKOCYTESUR Negative 08/14/2014 0933   Sepsis Labs: @LABRCNTIP (procalcitonin:4,lacticidven:4) Microbiology: No results found for this or any previous visit (from the past 240  hours).     Radiology Studies last 3 days: CT CHEST ABDOMEN PELVIS W CONTRAST Result Date: 09/15/2023 CLINICAL DATA:  Abdominal pain, postop. Respiratory distress. Electronic records indicates recent laparotomy with lysis of adhesions and colostomy revision. EXAM: CT CHEST, ABDOMEN, AND PELVIS WITH CONTRAST TECHNIQUE: Multidetector CT imaging of the chest, abdomen and pelvis was performed following the standard protocol during bolus administration of intravenous contrast. RADIATION DOSE REDUCTION: This exam was performed according to the departmental dose-optimization program which includes automated exposure control, adjustment of the mA and/or kV according to patient size and/or use of iterative reconstruction technique. CONTRAST:  OMNIPAQUE  IOHEXOL  300 MG/ML  SOLN COMPARISON:  Abdominopelvic CT 09/06/2023 FINDINGS: CT CHEST FINDINGS Cardiovascular: Right internal jugular central line tip is in the right atrium. Right upper extremity PICC tip is in the mid SVC. The heart is mildly enlarged. There are coronary artery calcifications. Aortic atherosclerosis without aneurysm or acute aortic findings. Mediastinum/Nodes: The esophagus is diffusely dilated and  fluid-filled to the thoracic inlet. No mediastinal or hilar adenopathy. Lungs/Pleura: Right lower lobe consolidation with central air bronchograms, similar to prior. There are adjacent nodular densities in the right middle lobe. Dependent density in the left lower lobe is similar. Small right and trace left pleural effusion. Breathing motion artifact limits detailed assessment. No features of pulmonary edema. No intraluminal debris. Musculoskeletal: Avascular necrosis of both humeral heads. Subchondral collapse on the left. Remote medial left clavicle fracture. CT ABDOMEN PELVIS FINDINGS Hepatobiliary: No focal liver lesion. Punctate hepatic granuloma. Layering gallstones in the gallbladder. No pericholecystic inflammation. There is no biliary  dilatation. Pancreas: No ductal dilatation or inflammation. Spleen: Normal in size without focal abnormality. Adrenals/Urinary Tract: Unchanged bilateral adrenal nodules, adenomas based on prior imaging. No imaging follow-up is recommended. Stable lobulated renal contours of both kidneys. No hydronephrosis. No suspicious renal lesion. There is mild diffuse bladder wall thickening. Stomach/Bowel: The stomach is prominently distended with air and fluid. Dilated fluid-filled duodenum and small bowel. There is no discrete small bowel transition point. Midline ventral abdominal wall hernia contains loops of small bowel, without associated small bowel wall thickening. Normal appendix is visualized. Fluid/liquid stool within the cecum and ascending colon. Transverse colon is decompressed. Left lower quadrant colostomy. There is retained barium within the excluded descending and sigmoid colon. Vascular/Lymphatic: Aortic and branch atherosclerosis. No aneurysm. The portal vein is patent. No enlarged lymph nodes in the abdomen or pelvis. Reproductive: Unremarkable prostate. Other: Laparotomy changes with midline skin staples. Rectus diastasis with midline ventral abdominal wall hernia containing small bowel that is not inflamed. There is generalized body wall edema. No significant ascites or free fluid. No abdominopelvic collection. No free air. Musculoskeletal: Left hip arthroplasty. There are no acute or suspicious osseous abnormalities. IMPRESSION: 1. Diffusely dilated fluid-filled esophagus to the thoracic inlet. The stomach is prominently distended with air and fluid. Dilated fluid-filled duodenum and small bowel. No discrete small bowel transition point. Favor postoperative ileus, although recurrent obstruction could have a similar appearance. 2. Left lower quadrant colostomy. Fluid/liquid stool within the cecum and ascending colon. 3. Dependent densities in the lower lobes, right greater than left. Findings may  represent atelectasis or pneumonia, including aspiration. Adjacent nodular airspace disease in the right middle lobe is suspicious for pneumonia. 4. Small right and trace left pleural effusions. 5. Cholelithiasis. 6. Avascular necrosis of both humeral heads with subchondral collapse on the left. 7. Generalized body wall edema. Aortic Atherosclerosis (ICD10-I70.0). Electronically Signed   By: Andrea Gasman M.D.   On: 09/15/2023 17:30    Time spent 50 min     Laneta Blunt, DO [facility] 09/18/2023, 12:01 PM    Dictation software may have been used to generate the above note. Typos may occur and escape review in typed/dictated notes. Please contact Dr Blunt directly for clarity if needed.  Staff may message me via secure chat in Epic  but this may not receive an immediate response,  please page me for urgent matters!  If 7PM-7AM, please contact night coverage [email or web address]

## 2023-09-18 NOTE — Plan of Care (Signed)

## 2023-09-18 NOTE — Plan of Care (Signed)
  Problem: Clinical Measurements: Goal: Ability to maintain clinical measurements within normal limits will improve Outcome: Progressing Goal: Will remain free from infection Outcome: Progressing Goal: Diagnostic test results will improve Outcome: Progressing Goal: Respiratory complications will improve Outcome: Progressing Goal: Cardiovascular complication will be avoided Outcome: Progressing   Problem: Education: Goal: Knowledge of General Education information will improve Description: Including pain rating scale, medication(s)/side effects and non-pharmacologic comfort measures Outcome: Progressing   Problem: Activity: Goal: Risk for activity intolerance will decrease Outcome: Progressing

## 2023-09-18 NOTE — Progress Notes (Signed)
 Nutrition Follow-up  DOCUMENTATION CODES:   Not applicable  INTERVENTION:   -Nepro Shake po TID, each supplement provides 425 kcal and 19 grams protein  -Magic cup TID with meals, each supplement provides 290 kcal and 9 grams of protein  -MVI with minerals daily   NUTRITION DIAGNOSIS:   Inadequate oral intake related to altered GI function as evidenced by NPO status.  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  12/31- s/p BSE- plan for dysphagia 1 diet with nectar thick liquids when cleared for diet per SLP 1/1- NGT placed (per KUB on 09/05/23 revealed tip of tube in stomach)  1/3- PICC placed, TPN 1/4- NGT fell out and replaced 1/6- NGT pulled again 1/7- s/p Exploratory Laparotomy, lysis of adhesions, revision of loop colostomy into end colostomy with mucous fistula 1/9- NGT d/c, advanced to clear liquid diet 1/10- advanced to full liquids 1/11- TPN d/c 1/12- s/p closure of wound dehiscence  1/13- advanced to clear liquid diet, NGT d/c 1/14- advanced to full liquid diet  Reviewed I/O's: -800 ml x 24 hours and -17.7 L since 09/04/23  UOP: 500 ml x 24 hours  Colostomy output: 300 ml x 24 hours   Per general surgery notes, pt with worsening coughing episodes likely leading to fascial dehiscence on 09/15/23; surgical repair of the dehiscence with placement of retention sutures to minimize worsening.   Pt sitting up in bed at time of visit. Pt reports not feeling well  and hurts all over. Pt is drinking some liquids without difficulty.   Discussed importance of good meal and supplement intake to promote healing. Pt amenable to supplements.   Wt has been stable over the past week.   Medications reviewed and include pulmicort , keppra , melatonin, protonix , and miralax .   Labs reviewed: CBGS: 59-97 (inpatient orders for glycemic control are none).    Diet Order:   Diet Order             Diet full liquid Fluid consistency: Thin  Diet effective now                   EDUCATION NEEDS:   Not appropriate for education at this time  Skin:  Skin Assessment: Skin Integrity Issues: Skin Integrity Issues:: Incisions Unstageable: rt and lt heels Incisions: closed abdomen  Last BM:  09/17/23 (type 7- 300 ml via colostomy)  Height:   Ht Readings from Last 1 Encounters:  09/04/23 5' 11 (1.803 m)    Weight:   Wt Readings from Last 1 Encounters:  09/18/23 93.6 kg    Ideal Body Weight:  78.1 kg  BMI:  Body mass index is 28.78 kg/m.  Estimated Nutritional Needs:   Kcal:  2150-2350  Protein:  105-120 grams  Fluid:  2-2.2 L    Margery ORN, RD, LDN, CDCES Registered Dietitian III Certified Diabetes Care and Education Specialist If unable to reach this RD, please use RD Inpatient group chat on secure chat between hours of 8am-4 pm daily

## 2023-09-18 NOTE — Progress Notes (Addendum)
 [facility] SURGICAL PROGRESS NOTE  Hospital Day(s): 15.   Post op day(s): 3 Days Post-Op.   Interval History:  Patient seen and examined No acute events or new complaints overnight.  Patient reports he is doing okay; abdominal soreness expectedly No fever, chills, nausea, emesis  Continues without leukocytosis; WBC 7.2K Hgb to 10.0 sCr - 0.99; UO - 500 ccs + unmeasured  Hyperkalemia resolved this AM NGT out after passing long clamping trial  Colostomy with gas and stool in bag; 300 ccs + unmeasured  He is on CLD; tolerating   Vital signs in last 24 hours: [min-max] current  Temp:  [97.7 F (36.5 C)-98.5 F (36.9 C)] 98.4 F (36.9 C) (01/14 0400) Pulse Rate:  [77-92] 92 (01/13 2354) Resp:  [16-20] 20 (01/14 0400) BP: (120-148)/(53-105) 148/105 (01/14 0400) SpO2:  [96 %-99 %] 99 % (01/14 0400) Weight:  [93.6 kg] 93.6 kg (01/14 0500)     Height: 5' 11 (180.3 cm) Weight: 93.6 kg BMI (Calculated): 28.79   Intake/Output last 2 shifts:  01/13 0701 - 01/14 0700 In: -  Out: 800 [Urine:500; Stool:300]   Physical Exam:  Constitutional: alert, cooperative and no distress   Respiratory: breathing non-labored at rest Cardiovascular: regular rate and sinus rhythm  Gastrointestinal: soft, incisional soreness, and non-distended. No rebound/guarding. 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, lots of gas and stool in bag Genitourinary: Condom catheter in place  Integumentary: Laparotomy is CDI with staples and now with retention sutures, honeycomb removed, serous drainage present   Labs:     Latest Ref Rng & Units 09/18/2023    4:37 AM 09/17/2023    5:25 AM 09/16/2023    6:40 AM  CBC  WBC 4.0 - 10.5 K/uL 7.2  8.1  9.9   Hemoglobin 13.0 - 17.0 g/dL 89.9  8.5  9.0   Hematocrit 39.0 - 52.0 % 32.3  28.0  29.2   Platelets 150 - 400 K/uL 209  159  173       Latest Ref Rng & Units 09/18/2023    4:37 AM 09/17/2023    5:25 AM 09/16/2023    7:56 AM  CMP  Glucose 70 - 99 mg/dL  74  78  870   BUN 8 - 23 mg/dL 13  20  21    Creatinine 0.61 - 1.24 mg/dL 9.00  9.05  9.07   Sodium 135 - 145 mmol/L 136  140  134   Potassium 3.5 - 5.1 mmol/L 4.7  5.6  6.0   Chloride 98 - 111 mmol/L 103  106  104   CO2 22 - 32 mmol/L 24  24  24    Calcium  8.9 - 10.3 mg/dL 8.3  8.3  8.2      Imaging studies: No new pertinent imaging studies   Assessment/Plan: 73 y.o. male 3 Days Post-Op closure of wound dehiscence with rentention sutures s/p initial exploratory laparotomy, LOA, and colostomy revision on 01/07   - Will advance to FLD  - Monitor H&H; stable  - Monitor abdominal examination  - Wound care: Removed honeycomb, okay to cover with superficial gauze and ABD pad as needed.  - Monitor colostomy output; record - Pain control prn; antiemetics prn - Further management per primary service; we will follow    All of the above findings and recommendations were discussed with the patient, and the medical team, and all of patient's questions were answered to his expressed satisfaction.  -- Arthea Platt, PA-C [facility] 09/18/2023, 7:16 AM  M-F: 7am - 4pm

## 2023-09-18 NOTE — Consult Note (Signed)
 WOC Nurse ostomy follow up Stoma type/location: colostomy; LUQ Stomal assessment/size: 2 budded, edematous, pink, moist. Visualized though pouch.  Pouch change per nursing staff yesterday Peristomal assessment: NA Treatment options for stomal/peristomal skin: barrier ring Output liquid brown Ostomy pouching: 2pc. 2 3/4  Education provided: NA Enrolled patient in [employer] Discharge program: Yes, previously  No teaching, patient dependent in ostomy care at SNF.   Calvin [name] [facility], CNS, [employer] [phone]

## 2023-09-19 ENCOUNTER — Inpatient Hospital Stay: Payer: Medicare Other

## 2023-09-19 DIAGNOSIS — J9601 Acute respiratory failure with hypoxia: Secondary | ICD-10-CM | POA: Diagnosis not present

## 2023-09-19 DIAGNOSIS — R569 Unspecified convulsions: Secondary | ICD-10-CM

## 2023-09-19 DIAGNOSIS — I4892 Unspecified atrial flutter: Secondary | ICD-10-CM

## 2023-09-19 DIAGNOSIS — Z7189 Other specified counseling: Secondary | ICD-10-CM | POA: Diagnosis not present

## 2023-09-19 DIAGNOSIS — K5981 Ogilvie syndrome: Secondary | ICD-10-CM | POA: Diagnosis not present

## 2023-09-19 LAB — CBC
HCT: 28.8 % — ABNORMAL LOW (ref 39.0–52.0)
Hemoglobin: 9.2 g/dL — ABNORMAL LOW (ref 13.0–17.0)
MCH: 29.1 pg (ref 26.0–34.0)
MCHC: 31.9 g/dL (ref 30.0–36.0)
MCV: 91.1 fL (ref 80.0–100.0)
Platelets: 216 10*3/uL (ref 150–400)
RBC: 3.16 MIL/uL — ABNORMAL LOW (ref 4.22–5.81)
RDW: 14.8 % (ref 11.5–15.5)
WBC: 8.3 10*3/uL (ref 4.0–10.5)
nRBC: 0 % (ref 0.0–0.2)

## 2023-09-19 LAB — BASIC METABOLIC PANEL
Anion gap: 11 (ref 5–15)
BUN: 11 mg/dL (ref 8–23)
CO2: 25 mmol/L (ref 22–32)
Calcium: 8.2 mg/dL — ABNORMAL LOW (ref 8.9–10.3)
Chloride: 101 mmol/L (ref 98–111)
Creatinine, Ser: 1.03 mg/dL (ref 0.61–1.24)
GFR, Estimated: >60 mL/min (ref >60)
Glucose, Bld: 97 mg/dL (ref 70–99)
Potassium: 4.1 mmol/L (ref 3.5–5.1)
Sodium: 137 mmol/L (ref 135–145)

## 2023-09-19 LAB — GLUCOSE, CAPILLARY
Glucose-Capillary: 104 mg/dL — ABNORMAL HIGH (ref 70–99)
Glucose-Capillary: 71 mg/dL (ref 70–99)
Glucose-Capillary: 85 mg/dL (ref 70–99)
Glucose-Capillary: 87 mg/dL (ref 70–99)

## 2023-09-19 MED ORDER — SODIUM CHLORIDE 0.9% FLUSH
3.0000 mL | Freq: Two times a day (BID) | INTRAVENOUS | Status: DC
  Administered 2023-09-19 – 2023-09-21 (×4): 10 mL via INTRAVENOUS
  Administered 2023-09-21 – 2023-09-22 (×2): 3 mL via INTRAVENOUS
  Administered 2023-09-23: 10 mL via INTRAVENOUS
  Administered 2023-09-23 – 2023-09-24 (×2): 3 mL via INTRAVENOUS
  Administered 2023-09-25 – 2023-09-28 (×6): 10 mL via INTRAVENOUS
  Administered 2023-09-28: 5 mL via INTRAVENOUS
  Administered 2023-09-29 – 2023-10-01 (×6): 10 mL via INTRAVENOUS
  Administered 2023-10-02: 3 mL via INTRAVENOUS
  Administered 2023-10-02 – 2023-10-06 (×8): 10 mL via INTRAVENOUS

## 2023-09-19 MED ORDER — SODIUM CHLORIDE 0.9% FLUSH: 3.0000 mL | INTRAVENOUS | Status: DC | PRN

## 2023-09-19 MED ORDER — OXYCODONE HCL 5 MG PO TABS
5.0000 mg | ORAL_TABLET | Freq: Four times a day (QID) | ORAL | Status: DC | PRN
  Administered 2023-09-19 – 2023-09-20 (×4): 5 mg via ORAL
  Filled 2023-09-19 (×4): qty 1

## 2023-09-19 MED ORDER — HYDROMORPHONE HCL 1 MG/ML IJ SOLN
0.5000 mg | INTRAMUSCULAR | Status: DC | PRN
  Administered 2023-09-19 – 2023-09-21 (×6): 0.5 mg via INTRAVENOUS
  Filled 2023-09-19 (×6): qty 0.5

## 2023-09-19 NOTE — TOC Progression Note (Signed)
 Transition of Care [facility]) - Progression Note    Patient Details  Name: Calvin [name] MRN: [id number] Date of Birth: October 27, 1950  Transition of Care [facility]) CM/SW Contact  Electa Sterry A Wyett Narine, RN Phone Number: 09/19/2023, 11:45 AM  Clinical Narrative:    Chart reviewed.  Noted that patient may be a tentative discharge for tomorrow.    I have informed Ricky, Admission Coordinator with [facility] that patient may be a tentative discharge for tomorrow.  Clinical information sent via Epic hub.    TOC will continue to follow progress of patient.     Expected Discharge Plan: Long Term Nursing Home Barriers to Discharge: Continued Medical Work up  Expected Discharge Plan and Services                                               Social Determinants of Health (SDOH) Interventions SDOH Screenings   Food Insecurity: No Food Insecurity (09/11/2023)  Housing: Low Risk  (09/11/2023)  Transportation Needs: No Transportation Needs (09/11/2023)  Utilities: Not At Risk (09/11/2023)  Social Connections: Unknown (09/11/2023)  Tobacco Use: Medium Risk (09/03/2023)    Readmission Risk Interventions    08/20/2023   12:32 PM 07/17/2023   12:24 PM 06/30/2022    2:25 PM  Readmission Risk Prevention Plan  Transportation Screening Complete Complete Complete  PCP or Specialist Appt within 3-5 Days  Complete   Social Work Consult for Recovery Care Planning/Counseling  Complete   Palliative Care Screening  Not Applicable   Medication Review [facility]) Complete Complete   PCP or Specialist appointment within 3-5 days of discharge Complete    HRI or Home Care Consult Complete  Complete  SW Recovery Care/Counseling Consult Complete  Complete  Palliative Care Screening Not Applicable  Complete  Skilled Nursing Facility Complete  Complete

## 2023-09-19 NOTE — Progress Notes (Signed)
Abdominal binder applied to abdomen.

## 2023-09-19 NOTE — Evaluation (Addendum)
 Clinical/Bedside Swallow Evaluation Patient Details  Name: Calvin [name] MRN: [id number] Date of Birth: 06/19/1951  Today's Date: 09/19/2023 Time: SLP Start Time (ACUTE ONLY): 0930 SLP Stop Time (ACUTE ONLY): 1030 SLP Time Calculation (min) (ACUTE ONLY): 60 min  Past Medical History:  Past Medical History:  Diagnosis Date   Alcohol  abuse    drinks on weekend   Anemia    Anxiety    Arthritis    Cancer (HCC)    colon,throat   COPD (chronic obstructive pulmonary disease) (HCC)    Coronary artery disease    Depression    Gout    Hemiplegia and 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 side (HCC)    Hypertension    Myocardial infarction (HCC)    Neuromuscular disorder (HCC)    Ogilvie syndrome    Seizures (HCC)    last 6 months ago   Stroke [facility])    multiple  left side weakness   Tremors of nervous system    Unstable angina (HCC)    Past Surgical History:  Past Surgical History:  Procedure Laterality Date   CARPAL TUNNEL RELEASE Left 10/19/2015   Procedure: CARPAL TUNNEL RELEASE;  Surgeon: Molli Angelucci, MD;  Location: [facility] ORS;  Service: Orthopedics;  Laterality: Left;   COLONOSCOPY WITH PROPOFOL  N/A 10/26/2021   Procedure: COLONOSCOPY WITH PROPOFOL ;  Surgeon: Shane Darling, MD;  Location: [facility] ENDOSCOPY;  Service: Endoscopy;  Laterality: N/A;   COLONOSCOPY WITH PROPOFOL  N/A 06/02/2022   Procedure: COLONOSCOPY WITH PROPOFOL ;  Surgeon: Shane Darling, MD;  Location: [facility] ENDOSCOPY;  Service: Endoscopy;  Laterality: N/A;   COLONOSCOPY WITH PROPOFOL  N/A 06/06/2022   Procedure: COLONOSCOPY WITH PROPOFOL ;  Surgeon: Shane Darling, MD;  Location: [facility] ENDOSCOPY;  Service: Endoscopy;  Laterality: N/A;   COLOSTOMY REVISION N/A 09/11/2023   Procedure: COLOSTOMY REVISION;  Surgeon: Emmalene Hare, MD;  Location: [facility] ORS;  Service: General;  Laterality: N/A;   JOINT REPLACEMENT     left partial hip    LAPAROSCOPIC SIGMOID COLECTOMY   09/12/2010   Lap hand assisted sigmoidectomy, mobilization splenic flexure -- Dr. Leighton Punches   LAPAROTOMY N/A 09/11/2023   Procedure: EXPLORATORY LAPAROTOMY;  Surgeon: Emmalene Hare, MD;  Location: [facility] ORS;  Service: General;  Laterality: N/A;   LAPAROTOMY N/A 09/15/2023   Procedure: EXPLORATORY LAPAROTOMY;  Surgeon: Conrado Delay, DO;  Location: [facility] ORS;  Service: General;  Laterality: N/A;   LYSIS OF ADHESION N/A 09/11/2023   Procedure: LYSIS OF ADHESION;  Surgeon: Emmalene Hare, MD;  Location: [facility] ORS;  Service: General;  Laterality: N/A;   THROAT SURGERY  09/05/2011   cancer   HPI:  Pt is a 73 y.o. male with medical history significant of Dysphagia per FEES 07/2023, seizure disorder, CVA with residual left-sided hemiparesis, COPD, chronic HFpEF, CKD stage II, HTN, colon cancer status post transverse colectomy and colostomy, presented from nusring home after nausea and vomiting, pt was hypoxic and febrile and EMS was called. Arrived with saturations in 70s and was placed on 15L via NRB mask. DG abdomen 09/03/23 showed " Recurrent small-bowel ileus or small-bowel obstruction, similar to  CT Abdomen and Pelvis 07/15/2023." Patient has prior history of dysphagia, most recently on nectar/puree diet. During recent admission, FEES 07/20/23 penetrated liquids to glottis, absent epiglottic deflection, so PES opening with backflow, senses and ejects penetrate and aspirate and significant pharyngeal residue.  Pt is now s/p Surgery and recommended full liquids (thins per MD order) per Surgery to advance to puree when cleared by  Speech; Surgery following for: "4 Days Post-Op closure of wound dehiscence with rentention sutures s/p initial exploratory laparotomy, LOA, and colostomy revision on 01/07".   W/ pt's documented Dysphagia, he is expected to cough a great deal w/ oral intake per assessment notes; presumed to be at increased risk for aspiration/aspiration pneumonia in setting of Dysphagia.    CHest CT on 09/15/23:  Lungs/Pleura: Right lower lobe consolidation with central air  bronchograms, similar to prior. There are adjacent nodular densities  in the right middle lobe. Dependent density in the left lower lobe  is similar. Small right and trace left pleural effusion.    Assessment / Plan / Recommendation  Clinical Impression  Pt seen for repeat CSE this morning s/p clearance from Surgery to have an oral diet. Initial diet was placed by MD for a full liquids diet(thin consistency) w/ clearance to upgrade if able to. Pt was assessed initially admit; pt has had Multiple swallowing assessments by ST services including at [facility] w/ a FEES on 07/20/2023. Pt has KNOWN DYSPHAGIA. Pt resting in bed; verbal and distracted. Respiratory status appeared unlabored; pt was mildly agitated stating he had abdominal "pain" -- NSG updated. Noted recent surgery. He followed basic directions w/ MOD cues.  On [place] O2 2L; afebrile currently. WBC 8.3.  Per chart notes 08/2023(last admit days prior) at [facility]: "pt has a history of dysphagia with laryngeal penetration, pharyngeal residue observed during FEES 07/20/23. Oral care provided to remove dried lingual debris that was moderately effective. He is endentulous and was able to assist in holding cup. Signs of pharyngeal dysphagia present with immediate and delayed cough with thin with multiple swallows. He had multiple swallows with cup and straw sips nectar thick but no s/s aspiration. Puree he transited with multiple swallows with suspected pharyngeal residue as noted in FEES last month. Pt at risk of aspiration given history. Recommend puree diet, nectar thick liquids, crush meds, swallow 2 times.".    At this CSE today, pt appears to present w/ oropharyngeal phase dysphagia w/ primary pharyngeal phase neuromuscular deficits as described in previous assessments by ST services. Pt consumed po trials w/ no immediate, overt clinical s/s of aspiration during the trials, HOWEVER,  subtle, delayed pharyngeal phase swallowing deficits were noted -- this appears similar to swallowing issues described at prior assessments.  Pt appears at increased risk for aspiration/aspiration pneumonia w/ oral intake in setting of Dysphagia.  Pt has challenging factors that could impact oropharyngeal swallowing to include Pain/discomfort(NSG aware), deconditioning/weakness, Chronic Dysphagia, and limited self-feeding abilities. These factors can increase risk for aspiration, dysphagia as well as decreased oral intake overall.   During po trials of Nectar liquids and purees ONLY, pt consumed all consistencies w/ no immediate, overt coughing, decline in vocal quality, or change in respiratory presentation during/post trials. However, delayed throat clearing occurred -- suspect in setting of KNOWN pharyngeal phase dysphagia. O2 sats remained in mid 90s when checked. Oral phase appeared grossly [facility] w/ timely bolus management and control of bolus propulsion for A-P transfer for swallowing. Oral clearing achieved w/ all trial consistencies -- Time given b/t trials for clearing using f/u Dry swallows.  OM Exam appeared grossly [facility] w/ no unilateral weakness noted. Speech Clear. Pt required feeding support.  Recommend a Dysphagia level 1 (puree) diet w/ moistened foods; Nectar consistency liquids -- carefully monitor bolus size and use of a SPOON for feeding/drinking is recommended as needed for SMALL single sips. Recommend aspiration precautions, f/u Dry swallows to  clear pharyngeal residue as able. Alternate foods/liquids. Reduce distractions and talking during oral intake. Throat clearing intermittently during/after po's. Feeding support and Supervision at meals. Pills CRUSHED in Puree for safer, easier swallowing.  Education given on Pills in Puree; food consistencies and options; aspiration precautions; dysphagia and risk for aspiration to pt. Recommend pt have f/u for ongoing Education and toleration of  diet at his next venue of care; Education for [facility] also. Recommend Palliative Care consult for GOC discussion in setting of KNOWN DYSPHAGIA  and risk for aspiration pneumonia.  No further skilled ST services indicated in the Acute setting. MD to reconsult if any new needs arise. NSG updated, agreed. MD updated. Recommend Dietician f/u for support. SLP Visit Diagnosis: Dysphagia, oropharyngeal phase (R13.12);Dysphagia, pharyngeal phase (R13.13)    Aspiration Risk  Moderate aspiration risk;Risk for inadequate nutrition/hydration    Diet Recommendation   Nectar;Dysphagia 1 (puree) = a Dysphagia level 1 (puree) diet w/ moistened foods; Nectar consistency liquids -- carefully monitor bolus size and use of a SPOON for feeding/drinking is recommended as needed for SMALL single sips. Recommend aspiration precautions, f/u Dry swallows to clear pharyngeal residue as able. Alternate foods/liquids. Reduce distractions and talking during oral intake. Throat clearing intermittently during/after po's. Feeding support and Supervision at meals.   Medication Administration: Crushed with puree    Other  Recommendations Recommended Consults:  (Palliative Care consult; Dietician) Oral Care Recommendations: Oral care BID;Oral care before and after PO;Staff/trained caregiver to provide oral care Caregiver Recommendations: Avoid jello, ice cream, thin soups, popsicles;Remove water  pitcher;Have oral suction available    Recommendations for follow up therapy are one component of a multi-disciplinary discharge planning process, led by the attending physician.  Recommendations may be updated based on patient status, additional functional criteria and insurance authorization.  Follow up Recommendations Follow physician's recommendations for discharge plan and follow up therapies (at next venue of care)      Assistance Recommended at Discharge  FULL  Functional Status Assessment Patient has had a recent decline in their  functional status and/or demonstrates limited ability to make significant improvements in function in a reasonable and predictable amount of time  Frequency and Duration  (n/a)   (n/a)       Prognosis Prognosis for improved oropharyngeal function: Guarded Barriers to Reach Goals: Cognitive deficits;Time post onset;Severity of deficits Barriers/Prognosis Comment: chronic Dysphagia, Cognitive decline      Swallow Study   General Date of Onset: 09/03/23 HPI: Pt is a 73 y.o. male with medical history significant of Dysphagia per FEES 07/2023, seizure disorder, CVA with residual left-sided hemiparesis, COPD, chronic HFpEF, CKD stage II, HTN, colon cancer status post transverse colectomy and colostomy, presented from nusring home after nausea and vomiting, pt was hypoxic and febrile and EMS was called. Arrived with saturations in 70s and was placed on 15L via NRB mask. DG abdomen 09/03/23 showed " Recurrent small-bowel ileus or small-bowel obstruction, similar to  CT Abdomen and Pelvis 07/15/2023." Patient has prior history of dysphagia, most recently on nectar/puree diet. During recent admission, FEES 07/20/23 penetrated liquids to glottis, absent epiglottic deflection, so PES opening with backflow, senses and ejects penetrate and aspirate and significant pharyngeal residue.  Pt is now s/p Surgery and recommended full liquids (thins per MD order) per Surgery to advance to puree when cleared by Speech; Surgery following for: "4 Days Post-Op closure of wound dehiscence with rentention sutures s/p initial exploratory laparotomy, LOA, and colostomy revision on 01/07".   W/ pt's documented Dysphagia, he  is expected to cough a great deal w/ oral intake per assessment notes; presumed to be at increased risk for aspiration/aspiration pneumonia in setting of Dysphagia.    CHest CT on 09/15/23: Lungs/Pleura: Right lower lobe consolidation with central air  bronchograms, similar to prior. There are adjacent nodular  densities  in the right middle lobe. Dependent density in the left lower lobe  is similar. Small right and trace left pleural effusion. Type of Study: Bedside Swallow Evaluation Previous Swallow Assessment: Multiple -- including FEES at [facility] in 07/2023 admit. Dysphagia dx'd - Dys level 1 w/ Nectar liquids rec'd last admit. Diet Prior to this Study: Thin liquids (Level 0) (full liquid per Surgery) Temperature Spikes Noted: No (wbc 8.3) Respiratory Status: Nasal cannula (2L) History of Recent Intubation: No Behavior/Cognition: Alert;Cooperative;Pleasant mood;Confused;Distractible;Requires cueing (cues) Oral Cavity Assessment: Dry Oral Care Completed by SLP: Yes Oral Cavity - Dentition: Edentulous Vision: Functional for self-feeding Self-Feeding Abilities: Needs assist;Needs set up;Total assist Patient Positioning: Upright in bed (MAX assist) Baseline Vocal Quality: Normal Volitional Cough: Strong Volitional Swallow: Able to elicit    Oral/Motor/Sensory Function Overall Oral Motor/Sensory Function: Within functional limits   Ice Chips Ice chips: Not tested   Thin Liquid Thin Liquid: Not tested    Nectar Thick Nectar Thick Liquid: Within functional limits (grossly) Presentation: Cup;Self Fed;Spoon (supported; ~3 ozs) Pharyngeal Phase Impairments: Suspected delayed Swallow;Multiple swallows;Throat Clearing - Delayed (x2) Other Comments: no other overt s/s nor change in breathing   Honey Thick Honey Thick Liquid: Not tested   Puree Puree: Within functional limits (grossly) Presentation: Spoon (fed; 10 trials) Pharyngeal Phase Impairments: Multiple swallows   Solid     Solid: Not tested        Darla Edward, MS, CCC-SLP Speech Language Pathologist Rehab Services; [facility] [place] [phone] (ascom) Iyauna Sing 09/19/2023,5:01 PM

## 2023-09-19 NOTE — Progress Notes (Signed)
 Patient is refusing to let me look at his feet for wound measurements

## 2023-09-19 NOTE — Plan of Care (Signed)
  Problem: Education: Goal: Knowledge of General Education information will improve Description: Including pain rating scale, medication(s)/side effects and non-pharmacologic comfort measures Outcome: Progressing   Problem: Clinical Measurements: Goal: Ability to maintain clinical measurements within normal limits will improve Outcome: Progressing Goal: Will remain free from infection Outcome: Progressing Goal: Diagnostic test results will improve Outcome: Progressing Goal: Respiratory complications will improve Outcome: Progressing Goal: Cardiovascular complication will be avoided Outcome: Progressing   Problem: Nutrition: Goal: Adequate nutrition will be maintained Outcome: Progressing   Problem: Activity: Goal: Risk for activity intolerance will decrease Outcome: Progressing   Problem: Coping: Goal: Level of anxiety will decrease Outcome: Progressing   Problem: Pain Management: Goal: General experience of comfort will improve Outcome: Progressing

## 2023-09-19 NOTE — Progress Notes (Signed)
 PT Cancellation Note  Patient Details Name: Calvin [name] MRN: [id number] DOB: 1950-10-20   Cancelled Treatment:    Reason Eval/Treat Not Completed: Patient declined, no reason specified Patient declining any attempts for mobility or exercises this date. Stating "I don't feel like it today." Declining despite education and encouragement. PT will re-attempt at later date/time.   Janine Melbourne, PT, DPT Physical Therapist - [place]  [facility]    Swayze Kozuch A Rooney Swails 09/19/2023, 10:58 AM

## 2023-09-19 NOTE — Progress Notes (Signed)
   09/19/23 1430  Incision (Closed) 09/11/23 Abdomen  Date First Assessed/Time First Assessed: 09/11/23 1344   Location: Abdomen  Dressing Type Gauze (Comment)  Dressing New drainage  Dressing Change Frequency Daily  Site / Wound Assessment Other (Comment) (serosanguinous drainage)  Margins Attached edges (approximated)  Closure Staples  Drainage Amount Copious  Drainage Description Serosanguineous   Patient still c/o pain despite PRN pain medication attempts. Assessed patients abdominal incision dressing to find the abdominal dressing completely saturated with serosanguinous drainage, along with patients gown and linens. Incision site is draining moderate amount of serosanguinous drainage. Abdomen is firm, tender and distended. Apolonio Bay, PA notified and is in route to bedside.

## 2023-09-19 NOTE — Plan of Care (Signed)

## 2023-09-19 NOTE — Consult Note (Signed)
Consultation Note Date: 09/19/2023   [name]  DOB: 01/15/1951  MRN: [id number]  Age / Sex: 73 y.o., male  PCP: Keane Police, MD Referring Physician: Delfino Lovett, MD  Reason for Consultation: Establishing goals of care  HPI/Patient Profile: DVID BEDI is a 73 y.o. 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    Clinical Assessment and Goals of Care: Notes and labs reviewed.  Into see patient.  He is currently resting in bed complaining of a cough.  States he has 3 children, his daughter [name] would be his surrogate decision maker in addition to his brother Gabriel Rung.  He states he lives in [facility].  He states he initially had a stroke, and then had a second stroke where he needed physical assistance.  With conversation he states he would want ventilator support for a few days to a week, and believes he would want CPR.  He states he will need to get this worked out.  He discusses that his family had to make decisions on taking his brother off life support, he understands the decision that would need to be made.  SUMMARY OF RECOMMENDATIONS   Continue full code/full scope       Primary Diagnoses: Present on Admission:  Acute respiratory failure with hypoxia (HCC)  Sepsis (HCC)  CAD (coronary artery disease)  Chronic diastolic CHF (congestive heart failure) (HCC)  COPD (chronic obstructive pulmonary disease) (HCC)  Essential hypertension  Paroxysmal atrial flutter (HCC)  Ogilvie syndrome   I have reviewed the medical record, interviewed the patient and family, and examined the  patient. The following aspects are pertinent.  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 side (HCC)    Hypertension    Myocardial infarction (HCC)    Neuromuscular disorder (HCC)    Ogilvie syndrome    Seizures (HCC)    last 6 months ago   Stroke [facility])    multiple  left side weakness   Tremors of nervous system    Unstable angina (HCC)    Social History   Socioeconomic History   Marital status: Single    Spouse name: Not on file   Number of children: Not on file   Years of education: Not on file   Highest education level: Not on file  Occupational History   Not on file  Tobacco Use   Smoking status: Former    Types:  Cigarettes   Smokeless tobacco: Never  Substance and Sexual Activity   Alcohol use: Not Currently    Comment: weekends   Drug use: No   Sexual activity: Not on file  Other Topics Concern   Not on file  Social History Narrative   Not on file   Social Drivers of Health   Financial Resource Strain: Not on file  Food Insecurity: No Food Insecurity (09/11/2023)   Hunger Vital Sign    Worried About Running Out of Food in the Last Year: Never true    Ran Out of Food in the Last Year: Never true  Transportation Needs: No Transportation Needs (09/11/2023)   PRAPARE - Administrator, Civil Service (Medical): No    Lack of Transportation (Non-Medical): No  Physical Activity: Not on file  Stress: Not on file  Social Connections: Unknown (09/11/2023)   Social Connection and Isolation Panel [NHANES]    Frequency of Communication with Friends and Family: Three times a week    Frequency of Social Gatherings with Friends and Family: Patient declined    Attends  Religious Services: 1 to 4 times per year    Active Member of [employer] or Organizations: No    Attends Engineer, structural: Never    Marital Status: Not on file   Family History  Problem Relation Age of Onset   Cancer Mother    Hypertension Father    Leukemia Brother    Scheduled Meds:  acetaminophen  650 mg Oral Q6H   baclofen  5 mg Oral BID   benzonatate  200 mg Oral TID   budesonide (PULMICORT) nebulizer solution  0.25 mg Nebulization BID   Chlorhexidine Gluconate Cloth  6 each Topical Daily   enoxaparin (LOVENOX) injection  40 mg Subcutaneous Q24H   feeding supplement (NEPRO CARB STEADY)  237 mL Oral TID BM   insulin aspart  0-9 Units Subcutaneous Q8H   ipratropium-albuterol  3 mL Inhalation BID   lactulose  20 g Oral Daily   lamoTRIgine  50 mg Oral BID   levETIRAcetam  1,000 mg Oral BID   melatonin  10 mg Oral QHS   mometasone-formoterol  2 puff Inhalation BID   multivitamin with minerals  1 tablet Oral Daily   pantoprazole (PROTONIX) IV  40 mg Intravenous Q12H   polyethylene glycol  17 g Oral BID   pregabalin  100 mg Oral Daily   pregabalin  200 mg Oral QHS   sodium chloride flush  10-40 mL Intracatheter Q12H   sodium chloride flush  3-10 mL Intravenous Q12H   tamsulosin  0.4 mg Oral QPM   Continuous Infusions: PRN Meds:.HYDROcodone bit-homatropine, HYDROmorphone (DILAUDID) injection, LORazepam, metoCLOPramide (REGLAN) injection, oxyCODONE, senna, sodium chloride flush, sodium chloride flush Medications Prior to Admission:  Prior to Admission medications   Medication Sig Start Date End Date Taking? Authorizing Provider  aspirin 81 MG chewable tablet Take 81 mg by mouth daily.   Yes [provider]  baclofen (LIORESAL) 5 mg TABS tablet Take 5 mg by mouth 2 (two) times daily.   Yes [provider]  diazePAM, 20 MG Dose, (VALTOCO 20 MG DOSE) 2 x 10 MG/0.1ML LQPK Place 20 mg into the nose as needed (seizure lasting over 2 minutes). 08/23/23  Yes  Leroy Sea, MD  ipratropium-albuterol (DUONEB) 0.5-2.5 (3) MG/3ML SOLN Inhale 3 mLs into the lungs every 6 (six) hours as needed. 09/02/23  Yes [provider]  lactulose (CHRONULAC) 10 GM/15ML solution Take  20 g by mouth daily. 08/15/23  Yes [provider]  lamoTRIgine (LAMICTAL) 25 MG tablet Take 50 mg by mouth 2 (two) times daily.   Yes [provider]  levETIRAcetam (KEPPRA) 1000 MG tablet Take 1 tablet (1,000 mg total) by mouth 2 (two) times daily. 08/23/23  Yes Leroy Sea, MD  Melatonin 10 MG TABS Take 10 mg by mouth at bedtime.   Yes [provider]  nitroGLYCERIN (NITROSTAT) 0.4 MG SL tablet Place 0.4 mg under the tongue 3 (three) times daily as needed for chest pain. Every 5 minutes up to 3 doses 08/24/23  Yes [provider]  pantoprazole (PROTONIX) 40 MG tablet Take 1 tablet (40 mg total) by mouth daily. 08/23/23  Yes Leroy Sea, MD  polyethylene glycol (MIRALAX / GLYCOLAX) 17 g packet Take 17 g by mouth 2 (two) times daily. 08/19/23  Yes Wieting, Richard, MD  pregabalin (LYRICA) 100 MG capsule Take 200 mg by mouth at bedtime.   Yes [provider]  senna (SENOKOT) 8.6 MG TABS tablet Take 2 tablets (17.2 mg total) by mouth at bedtime as needed for mild constipation or moderate constipation. 06/26/23  Yes Sunnie Nielsen, DO  tamsulosin (FLOMAX) 0.4 MG CAPS capsule Take 0.4 mg by mouth every evening.   Yes [provider]  divalproex (DEPAKOTE) 125 MG DR tablet Take 4 tablets (500 mg total) by mouth 2 (two) times daily for 1 day, THEN 4 tablets (500 mg total) daily for 3 days, THEN 2 tablets (250 mg total) daily for 3 days, THEN 1 tablet (125 mg total) daily for 3 days. Patient not taking: Reported on 09/03/2023 08/23/23 09/03/23  Leroy Sea, MD  fluticasone [facility]) 50 MCG/ACT nasal spray Place 2 sprays into both nostrils daily. Patient not taking: Reported on 09/03/2023 05/15/23   [provider]  Mouthwashes (MOUTH RINSE) LIQD solution 15 mLs by Mouth Rinse route every 2 (two) hours. Patient not taking: Reported on 09/03/2023 08/19/23   Alford Highland, MD  pregabalin (LYRICA) 50 MG capsule Take 50 mg capsule twice daily and 100 mg (two capsules) at bedtime Patient not taking: Reported on 09/03/2023 08/23/23   Leroy Sea, MD  tiotropium (SPIRIVA) 18 MCG inhalation capsule Place 18 mcg into inhaler and inhale daily. Patient not taking: Reported on 09/03/2023    [provider]   No Known Allergies Review of Systems  Gastrointestinal:  Positive for abdominal distention and abdominal pain.    Physical Exam Abdominal:     General: There is distension.     Comments: Serosanguineous fluid on abdominal dressing  Neurological:     Mental Status: He is alert.     Vital Signs: BP 104/69 (BP Location: Left Arm)   Pulse 95   Temp 98.1 F (36.7 C)   Resp 16   Ht 5\' 11"  (1.803 m)   Wt 94 kg   SpO2 94%   BMI 28.90 kg/m  Pain Scale: Faces POSS *See Group Information*: S-Acceptable,Sleep, easy to arouse Pain Score: 10-Worst pain ever   SpO2: SpO2: 94 % O2 Device:SpO2: 94 % O2 Flow Rate: .O2 Flow Rate (L/min): 2 L/min  IO: Intake/output summary:  Intake/Output Summary (Last 24 hours) at 09/19/2023 1553 Last data filed at 09/19/2023 0900 Gross per 24 hour  Intake 480 ml  Output 200 ml  Net 280 ml    LBM: Last BM Date : 09/17/23 Baseline Weight: Weight: 97.8 kg Most recent weight: Weight: 94 kg  Signed by: Morton Stall, NP   Please contact Palliative Medicine Team phone at [phone] for questions and concerns.  For individual provider: See Loretha Stapler

## 2023-09-19 NOTE — Progress Notes (Signed)
 PROGRESS NOTE    Calvin [name]   ZOX:[id number] DOB: 11/03/1950  DOA: 09/03/2023 Date of Service: 09/19/23 which is hospital day 16  PCP: Corrin Dimes, MD    Hospital course / significant events:   HPI: Calvin [name] is a 73 y.o. 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   12/30: admitted to hospitalist sepsis, AKI, (+)Influenza A, likely aspiration pneumonia, acute hypoxic resp fail, SBO. NG placed, surgical consult.  12/31-01/06: relatively stable, remains SBO w/ NG and on TPN, worsening colostomy prolapse. Anemia w/o overt GIB, holding anti-plt/anti-cogs, GI no plans for endoscopic eval at this time 01/07: exlap, LOA, and colostomy revision  01/08: stable.  01/09: d/c NG 01/10: advancing diet as able. Hgb 6.8 - 1 unit PRBC given  01/11: persistent abd pain and cough, imaging concerning for abdominal surgical wound dehiscence deeper layers, going back to OR. Continue w/ cough suppression, await over-read but no obvious CT chest findings, suspect post-viral bronchitis  01/12: to OR early AM for closure of wound dehiscence. Postop stable, NG in place. CT chest question pneumonia but pt reports coughing improved, suspect was mostly reflux, will continue to monitor cough. Postop care per general surgery include clamp NG and remain NPO for now   01/13: NG out, CLD 01/14: FLD, continue advancing diet tolerated  1/15: Advanced to dysphagia diet after ST eval.  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 s/p ex-lab with lysis of adhesions on 1/7 S/p wound dehiscence repair 1/12 NG out, advance diet to dysphagia 1 per speech therapy evaluation General surgery following  Advised pt goal for limiting/reducing opiates d/t goal avoid ileus / promote bowel function, family voices encouragement of this as well, work on reducing opiates --> continue dilaudid  for breakthrough, oxy-APA 5-10 mg po q4h prn mod-severe, increased lyrica  from 200 mg at bedtime only to that plus lyrica  100 mg q AM  Cough, multifactorial Postviral/post-infectious component is likely Given CT findings of esophageal fluid/dilation, suspect reflux and/or aspiration pneumonitis is also likely especially since improvement w/ surgery and re-placement of NG Hx COPD but no apparent exacerbation No clearly worsening pneumonia, no indication for abx for pneumonitis, hold abx  Monitor sirs/sepsis parameters  Cough suppressants  PPI  Acute hypoxic respiratory failure Due to above  wean [place] O2 as able   Anemia continue bid ppi hold asa/heparin  for now can probably resume tomorrow  trend hgb/CBC transfuse for Hgb <7   Colostomy irritation/prolapse gen surg following  mild bleeding from irritate/edematous tissue, ok to monitor    COPD No acute exacerbation S/p 6 days prednisone  Nebs as needed.  Wean off oxygen   AKI on ckd 2 resolved Prerenal 2/2 dehydration monitor BMP   Seizure disorder With recent hospitalization for breakthrough seizures continue keppra , home lamictal  and pregabalin    hx CVA  With hemiplegia hold asa  given anemia/bleeding risk as above   BPH  cont home flomax    Chronic HFpEF Dehydrated as above judicious IV fluids if needed   Debility Resides at snf PT  at LTC     DVT prophylaxis: SCD IV fluids: no continuous IV fluids  Nutrition: Dysphagia 1 diet advance per general surgery Central lines / invasive devices: Remove central line.  NG tube discontinued already.  Discontinue telemetry.  Keep PICC line for now  Code Status: FULL CODE ACP documentation reviewed:  copy of DNR and MOST on file in [facility], DNR was voided dated 2021, MOST notes FULL CODE dated 2023 do DNR removed   TOC needs: TOC working with [employer] and rehab for potential discharge in next 1 to 2 days Barriers to dispo / significant pending items: advancing diet, surgical clearance prior to dc      Subjective / Brief ROS:  Still reporting a lot of pain and asking for Dilaudid .  Wants to eat  Family Communication: No family at bed side    Objective Findings:  Vitals:   09/18/23 2100 09/18/23 2350 09/19/23 0629 09/19/23 0824  BP:  126/63 111/66 104/69  Pulse:  93 91 95  Resp:  18 18 16   Temp:  98.7 F (37.1 C) 98.4 F (36.9 C) 98.1 F (36.7 C)  TempSrc:      SpO2:  94% 96% 94%  Weight: 94 kg     Height:        Intake/Output Summary (Last 24 hours) at 09/19/2023 1318 Last data filed at 09/19/2023 0900 Gross per 24 hour  Intake 480 ml  Output 200 ml  Net 280 ml   Filed Weights   09/18/23 0500 09/18/23 2010 09/18/23 2100  Weight: 93.6 kg 94 kg 94 kg    Examination:  Physical Exam Constitutional:      General: He is not in acute distress. Cardiovascular:     Rate and Rhythm: Normal rate and regular rhythm.  Pulmonary:     Effort: No respiratory distress.     Breath sounds: No rhonchi (scattered coarse breath sounds).  Abdominal:     General: A surgical scar is present. Bowel sounds are normal. There is distension (much improved from prior exams but still "full").     Palpations: Abdomen is soft.     Tenderness: There is abdominal tenderness (incision site, less severe compared to yesterday).     Comments: Ostomy prolapse w/ some irritated  friable appearing tissue, scant red thin serous bloody liquid in ostomy bag no bloody stool   Skin:    General: Skin is warm and dry.  Neurological:     Mental Status: He is alert.  Psychiatric:        Mood and Affect: Mood normal.        Behavior: Behavior normal.          Scheduled Medications:   acetaminophen   650 mg Oral Q6H   baclofen   5 mg Oral BID   benzonatate   200 mg Oral TID   budesonide  (PULMICORT ) nebulizer solution  0.25 mg Nebulization BID   Chlorhexidine  Gluconate Cloth  6 each Topical Daily   enoxaparin  (LOVENOX ) injection  40 mg Subcutaneous Q24H   feeding supplement (NEPRO CARB STEADY)  237 mL Oral TID BM   insulin  aspart  0-9 Units Subcutaneous Q8H   ipratropium-albuterol   3 mL Inhalation BID   lactulose   20 g Oral Daily   lamoTRIgine   50 mg Oral BID   levETIRAcetam   1,000  mg Oral BID   melatonin  10 mg Oral QHS   mometasone -formoterol   2 puff Inhalation BID   multivitamin with minerals  1 tablet Oral Daily   pantoprazole  (PROTONIX ) IV  40 mg Intravenous Q12H   polyethylene glycol  17 g Oral BID   pregabalin   100 mg Oral Daily   pregabalin   200 mg Oral QHS   sodium chloride  flush  10-40 mL Intracatheter Q12H   sodium chloride  flush  3-10 mL Intravenous Q12H   tamsulosin   0.4 mg Oral QPM    Continuous Infusions:    PRN Medications:  HYDROcodone  bit-homatropine, HYDROmorphone  (DILAUDID ) injection, LORazepam , metoCLOPramide  (REGLAN ) injection, oxyCODONE , senna, sodium chloride  flush, sodium chloride  flush  Antimicrobials from admission:  Anti-infectives (From admission, onward)    Start     Dose/Rate Route Frequency Ordered Stop   09/11/23 2000  cefoTEtan  (CEFOTAN ) 2 g in sodium chloride  0.9 % 100 mL IVPB        2 g 200 mL/hr over 30 Minutes Intravenous Every 8 hours 09/11/23 1801 09/12/23 1910   09/11/23 0600  cefoTEtan  (CEFOTAN ) 2 g in sodium chloride  0.9 % 100 mL IVPB        2 g 200 mL/hr over 30 Minutes Intravenous On call to O.R. 09/10/23  0858 09/12/23 1910   09/04/23 1200  Ampicillin -Sulbactam (UNASYN ) 3 g in sodium chloride  0.9 % 100 mL IVPB  Status:  Discontinued        3 g 200 mL/hr over 30 Minutes Intravenous Every 6 hours 09/04/23 1052 09/08/23 1537   09/03/23 1800  ceFEPIme  (MAXIPIME ) 2 g in sodium chloride  0.9 % 100 mL IVPB  Status:  Discontinued        2 g 200 mL/hr over 30 Minutes Intravenous Every 12 hours 09/03/23 0812 09/04/23 0903   09/03/23 1500  metroNIDAZOLE  (FLAGYL ) IVPB 500 mg  Status:  Discontinued        500 mg 100 mL/hr over 60 Minutes Intravenous Every 12 hours 09/03/23 0758 09/04/23 0903   09/03/23 1000  oseltamivir  (TAMIFLU ) capsule 30 mg        30 mg Oral 2 times daily 09/03/23 0757 09/08/23 0959   09/03/23 0315  ceFEPIme  (MAXIPIME ) 2 g in sodium chloride  0.9 % 100 mL IVPB        2 g 200 mL/hr over 30 Minutes Intravenous  Once 09/03/23 0304 09/03/23 0441   09/03/23 0315  metroNIDAZOLE  (FLAGYL ) IVPB 500 mg        500 mg 100 mL/hr over 60 Minutes Intravenous  Once 09/03/23 0304 09/03/23 0441   09/03/23 0315  vancomycin  (VANCOCIN ) IVPB 1000 mg/200 mL premix        1,000 mg 200 mL/hr over 60 Minutes Intravenous  Once 09/03/23 0304 09/03/23 0441           Data Reviewed:  I have personally reviewed the following...  CBC: Recent Labs  Lab 09/15/23 0640 09/16/23 0640 09/17/23 0525 09/18/23 0437 09/19/23 0657  WBC 11.3* 9.9 8.1 7.2 8.3  HGB 9.6* 9.0* 8.5* 10.0* 9.2*  HCT 31.2* 29.2* 28.0* 32.3* 28.8*  MCV 95.1 93.6 94.0 93.6 91.1  PLT 192 173 159 209 216   Basic Metabolic Panel: Recent Labs  Lab 09/13/23 0506 09/15/23 0640 09/16/23 0756 09/17/23 0525 09/18/23 0437 09/19/23 0657  NA 143 137 134* 140 136 137  K 4.2 5.4* 6.0* 5.6* 4.7 4.1  CL 109 106 104 106 103 101  CO2 26 25 24 24 24 25   GLUCOSE 119* 139*  129* 78 74 97  BUN 26* 25* 21 20 13 11   CREATININE 1.08 0.87 0.92 0.94 0.99 1.03  CALCIUM  8.1* 8.4* 8.2* 8.3* 8.3* 8.2*  MG 2.3  --   --   --   --   --   PHOS 4.2  --    --   --   --   --    GFR: Estimated Creatinine Clearance: 73.7 mL/min (by C-G formula based on SCr of 1.03 mg/dL). Liver Function Tests: Recent Labs  Lab 09/13/23 0506  AST 18  ALT 35  ALKPHOS 47  BILITOT 0.5  PROT 5.8*  ALBUMIN  2.6*   CBG: Recent Labs  Lab 09/18/23 0801 09/18/23 1228 09/18/23 1555 09/18/23 2002 09/19/23 0822  GLUCAP 77 85 100* 109* 104*   Lipid Profile: No results for input(s): "CHOL", "HDL", "LDLCALC", "TRIG", "CHOLHDL", "LDLDIRECT" in the last 72 hours. Thyroid  Function Tests: No results for input(s): "TSH", "T4TOTAL", "FREET4", "T3FREE", "THYROIDAB" in the last 72 hours. Anemia Panel: No results for input(s): "VITAMINB12", "FOLATE", "FERRITIN", "TIBC", "IRON", "RETICCTPCT" in the last 72 hours. Most Recent Urinalysis On File:     Component Value Date/Time   COLORURINE AMBER (A) 09/03/2023 0543   APPEARANCEUR CLOUDY (A) 09/03/2023 0543   APPEARANCEUR Clear 08/14/2014 0933   LABSPEC 1.026 09/03/2023 0543   LABSPEC 1.015 08/14/2014 0933   PHURINE 5.0 09/03/2023 0543   GLUCOSEU NEGATIVE 09/03/2023 0543   GLUCOSEU Negative 08/14/2014 0933   HGBUR NEGATIVE 09/03/2023 0543   BILIRUBINUR NEGATIVE 09/03/2023 0543   BILIRUBINUR Negative 08/14/2014 0933   KETONESUR NEGATIVE 09/03/2023 0543   PROTEINUR 30 (A) 09/03/2023 0543   UROBILINOGEN 0.2 06/12/2010 0103   NITRITE NEGATIVE 09/03/2023 0543   LEUKOCYTESUR LARGE (A) 09/03/2023 0543   LEUKOCYTESUR Negative 08/14/2014 0933   Sepsis Labs: @LABRCNTIP (procalcitonin:4,lacticidven:4) Microbiology: No results found for this or any previous visit (from the past 240 hours).     Radiology Studies last 3 days: CT CHEST ABDOMEN PELVIS W CONTRAST Result Date: 09/15/2023 CLINICAL DATA:  Abdominal pain, postop. Respiratory distress. Electronic records indicates recent laparotomy with lysis of adhesions and colostomy revision. EXAM: CT CHEST, ABDOMEN, AND PELVIS WITH CONTRAST TECHNIQUE: Multidetector CT imaging  of the chest, abdomen and pelvis was performed following the standard protocol during bolus administration of intravenous contrast. RADIATION DOSE REDUCTION: This exam was performed according to the departmental dose-optimization program which includes automated exposure control, adjustment of the mA and/or kV according to patient size and/or use of iterative reconstruction technique. CONTRAST:  OMNIPAQUE  IOHEXOL  300 MG/ML  SOLN COMPARISON:  Abdominopelvic CT 09/06/2023 FINDINGS: CT CHEST FINDINGS Cardiovascular: Right internal jugular central line tip is in the right atrium. Right upper extremity PICC tip is in the mid SVC. The heart is mildly enlarged. There are coronary artery calcifications. Aortic atherosclerosis without aneurysm or acute aortic findings. Mediastinum/Nodes: The esophagus is diffusely dilated and fluid-filled to the thoracic inlet. No mediastinal or hilar adenopathy. Lungs/Pleura: Right lower lobe consolidation with central air bronchograms, similar to prior. There are adjacent nodular densities in the right middle lobe. Dependent density in the left lower lobe is similar. Small right and trace left pleural effusion. Breathing motion artifact limits detailed assessment. No features of pulmonary edema. No intraluminal debris. Musculoskeletal: Avascular necrosis of both humeral heads. Subchondral collapse on the left. Remote medial left clavicle fracture. CT ABDOMEN PELVIS FINDINGS Hepatobiliary: No focal liver lesion. Punctate hepatic granuloma. Layering gallstones in the gallbladder. No pericholecystic inflammation. There is no biliary dilatation. Pancreas: No ductal dilatation  or inflammation. Spleen: Normal in size without focal abnormality. Adrenals/Urinary Tract: Unchanged bilateral adrenal nodules, adenomas based on prior imaging. No imaging follow-up is recommended. Stable lobulated renal contours of both kidneys. No hydronephrosis. No suspicious renal lesion. There is mild diffuse  bladder wall thickening. Stomach/Bowel: The stomach is prominently distended with air and fluid. Dilated fluid-filled duodenum and small bowel. There is no discrete small bowel transition point. Midline ventral abdominal wall hernia contains loops of small bowel, without associated small bowel wall thickening. Normal appendix is visualized. Fluid/liquid stool within the cecum and ascending colon. Transverse colon is decompressed. Left lower quadrant colostomy. There is retained barium within the excluded descending and sigmoid colon. Vascular/Lymphatic: Aortic and branch atherosclerosis. No aneurysm. The portal vein is patent. No enlarged lymph nodes in the abdomen or pelvis. Reproductive: Unremarkable prostate. Other: Laparotomy changes with midline skin staples. Rectus diastasis with midline ventral abdominal wall hernia containing small bowel that is not inflamed. There is generalized body wall edema. No significant ascites or free fluid. No abdominopelvic collection. No free air. Musculoskeletal: Left hip arthroplasty. There are no acute or suspicious osseous abnormalities. IMPRESSION: 1. Diffusely dilated fluid-filled esophagus to the thoracic inlet. The stomach is prominently distended with air and fluid. Dilated fluid-filled duodenum and small bowel. No discrete small bowel transition point. Favor postoperative ileus, although recurrent obstruction could have a similar appearance. 2. Left lower quadrant colostomy. Fluid/liquid stool within the cecum and ascending colon. 3. Dependent densities in the lower lobes, right greater than left. Findings may represent atelectasis or pneumonia, including aspiration. Adjacent nodular airspace disease in the right middle lobe is suspicious for pneumonia. 4. Small right and trace left pleural effusions. 5. Cholelithiasis. 6. Avascular necrosis of both humeral heads with subchondral collapse on the left. 7. Generalized body wall edema. Aortic Atherosclerosis (ICD10-I70.0).  Electronically Signed   By: Chadwick Colonel M.D.   On: 09/15/2023 17:30    Time spent 35 min     Kenisha Lynds Mason Sole, DO [facility] 09/19/2023, 1:18 PM    Dictation software may have been used to generate the above note. Typos may occur and escape review in typed/dictated notes.    If 7PM-7AM, please contact night coverage [email or web address]

## 2023-09-19 NOTE — Progress Notes (Signed)
 [facility] SURGICAL PROGRESS NOTE  Hospital Day(s): 16.   Post op day(s): 4 Days Post-Op.   Interval History:  Patient seen and examined No acute events or new complaints overnight.  Patient reports abdominal soreness No fever, chills, nausea, emesis  RN addressed concerns with swallowing this AM Continues without leukocytosis; WBC 8.3K Hgb to 9.2 sCr - 1.03; UO - 450 ccs + unmeasured  No electrolyte derangements Colostomy with gas and stool in bag; 100 ccs + unmeasured  He is on FLD; tolerating   Vital signs in last 24 hours: [min-max] current  Temp:  [97.5 F (36.4 C)-99 F (37.2 C)] 98.4 F (36.9 C) (01/15 0629) Pulse Rate:  [91-104] 91 (01/15 0629) Resp:  [16-18] 18 (01/15 0629) BP: (111-130)/(59-79) 111/66 (01/15 0629) SpO2:  [94 %-98 %] 96 % (01/15 0629) Weight:  [94 kg] 94 kg (01/14 2100)     Height: 5\' 11"  (180.3 cm) Weight: 94 kg BMI (Calculated): 28.92   Intake/Output last 2 shifts:  01/14 0701 - 01/15 0700 In: 480 [P.O.:480] Out: 550 [Urine:450; Stool:100]   Physical Exam:  Constitutional: alert, cooperative and no distress   Respiratory: breathing non-labored at rest Cardiovascular: regular rate and sinus rhythm  Gastrointestinal: soft, incisional soreness, and non-distended. No rebound/guarding. 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, lots of gas and stool in bag Genitourinary: Condom catheter in place  Integumentary: Laparotomy is CDI with staples and now with retention sutures, serous drainage present   Labs:     Latest Ref Rng & Units 09/19/2023    6:57 AM 09/18/2023    4:37 AM 09/17/2023    5:25 AM  CBC  WBC 4.0 - 10.5 K/uL 8.3  7.2  8.1   Hemoglobin 13.0 - 17.0 g/dL 9.2  16.1  8.5   Hematocrit 39.0 - 52.0 % 28.8  32.3  28.0   Platelets 150 - 400 K/uL 216  209  159       Latest Ref Rng & Units 09/18/2023    4:37 AM 09/17/2023    5:25 AM 09/16/2023    7:56 AM  CMP  Glucose 70 - 99 mg/dL 74  78  096   BUN 8 - 23 mg/dL 13  20  21     Creatinine 0.61 - 1.24 mg/dL 0.45  4.09  8.11   Sodium 135 - 145 mmol/L 136  140  134   Potassium 3.5 - 5.1 mmol/L 4.7  5.6  6.0   Chloride 98 - 111 mmol/L 103  106  104   CO2 22 - 32 mmol/L 24  24  24    Calcium  8.9 - 10.3 mg/dL 8.3  8.3  8.2      Imaging studies: No new pertinent imaging studies   Assessment/Plan: 73 y.o. male 4 Days Post-Op closure of wound dehiscence with rentention sutures s/p initial exploratory laparotomy, LOA, and colostomy revision on 01/07   - Okay for SLP evaluation from surgical perspective if there are concerns with swallowing; If cleared can advance diet as tolerated   - Monitor H&H; stable  - Monitor abdominal examination  - Wound care: Cover with superficial gauze and ABD pad as needed. Change daily  - Appreciate WOC RN care with ostomy  - Retention sutures will need to be maintained for 4-6 weeks - Monitor colostomy output; record - Pain control prn; antiemetics prn  - Okay to work with therapies - Further management per primary service; we will follow  - Discharge Planning: Doing well from surgical perspective,  diet advancing, having ostomy output. I do feel we can move towards discharge potentially tomorrow (01/16) as long as tolerates diet today.     All of the above findings and recommendations were discussed with the patient, and the medical team, and all of patient's questions were answered to his expressed satisfaction.  -- Apolonio Bay, PA-C [facility] 09/19/2023, 7:49 AM M-F: 7am - 4pm

## 2023-09-19 NOTE — [place] FL2 (Signed)
 [place] [facility]  MEDICAID FL2 LEVEL OF CARE FORM     IDENTIFICATION  [name] Birthdate: [birth year] Sex: male Admission Date (Current Location): 09/03/2023  [place] and [place] Number:  Kaylene Pascal [id number] Q Facility and Address:  [facility], [address] [place], [place] [address]      Provider Number: [id number]  Attending Physician Name and Address:  Brenna Cam, MD  Relative Name and Phone Number:  Mathilda Solum [phone]    Current Level of Care: Hospital Recommended Level of Care: Skilled Nursing Facility Prior Approval Number:    Date Approved/Denied:   PASRR Number: [id number]  Discharge Plan: SNF (Long-Term Care)    Current Diagnoses: Patient Active Problem List   Diagnosis Date Noted   Small bowel obstruction due to adhesions (HCC) 09/11/2023   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   Cancer of sigmoid (HCC) 09/04/2023   Gout 09/04/2023   Acute respiratory failure with hypoxia (HCC) 09/03/2023   Acute metabolic encephalopathy 08/19/2023   History of CVA (cerebrovascular accident) 08/19/2023   Thrombocytopenia (HCC) 08/19/2023   Hypotension 07/15/2023   Small bowel obstruction (HCC) 07/15/2023   Hemiplegia and 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 side (HCC)    Temporal pain 06/18/2022   Acute kidney injury superimposed on CKD (HCC) 06/17/2022   Dysphagia 06/07/2022   Respiratory distress 06/02/2022   Neuropathy 06/02/2022   Paroxysmal atrial flutter (HCC) 06/01/2022   Aspiration pneumonia (HCC) 06/01/2022   Cervical spinal stenosis 05/31/2022   Chronic diastolic CHF (congestive heart failure) (HCC) 05/28/2022   SBO, recurrent (small bowel obstruction) (HCC)    Abdominal distention    HLD (hyperlipidemia) 10/16/2021   Nausea vomiting and diarrhea 10/16/2021   Sepsis (HCC) 10/16/2021   Tobacco abuse 10/16/2021   Muscle twitching 08/14/2019   UTI (urinary tract infection)  08/03/2019   Ileus (HCC) 08/03/2019   Hypokalemia 08/03/2019   QT prolongation 08/03/2019   Ogilvie syndrome    Acute abdominal pain 04/03/2019   Left-sided weakness 09/30/2017   Seizure (HCC) 09/30/2017   Essential hypertension 09/30/2017   CAD (coronary artery disease) 09/30/2017   COPD (chronic obstructive pulmonary disease) (HCC) 09/30/2017   Depression with anxiety 09/30/2017    Orientation RESPIRATION BLADDER Height & Weight     Self, Time, Place  Normal Incontinent Weight: 94 kg Height:  5\' 11"  (180.3 cm)  BEHAVIORAL SYMPTOMS/MOOD NEUROLOGICAL BOWEL NUTRITION STATUS    Convulsions/Seizures Colostomy Diet (See Discharge Summary)  AMBULATORY STATUS COMMUNICATION OF NEEDS Skin   Extensive Assist Verbally Surgical wounds (See Discharge summary for surgical dressing, Colostomy Care)                       Personal Care Assistance Level of Assistance  Bathing, Feeding, Dressing Bathing Assistance: Maximum assistance Feeding assistance: Maximum assistance Dressing Assistance: Maximum assistance     Functional Limitations Info  Sight, Hearing, Speech Sight Info: Adequate Hearing Info: Adequate Speech Info: Adequate    SPECIAL CARE FACTORS FREQUENCY                       Contractures Contractures Info: Not present    Additional Factors Info  Code Status, Allergies Code Status Info: Full Code Allergies Info: No Known Allergies           Current Medications (09/19/2023):  This is the current hospital active medication list Current Facility-Administered Medications  Medication Dose Route Frequency Provider Last Rate Last Admin   acetaminophen  (TYLENOL ) tablet 650  mg  650 mg Oral Q6H Alexander, Natalie, DO   650 mg at 09/18/23 2345   baclofen  (LIORESAL ) tablet 5 mg  5 mg Oral BID Rosea Conch, Isami, DO   5 mg at 09/19/23 7829   benzonatate  (TESSALON ) capsule 200 mg  200 mg Oral TID Sakai, Isami, DO   200 mg at 09/19/23 0831   budesonide  (PULMICORT ) nebulizer  solution 0.25 mg  0.25 mg Nebulization BID Rosea Conch, Isami, DO   0.25 mg at 09/19/23 5621   Chlorhexidine  Gluconate Cloth 2 % PADS 6 each  6 each Topical Daily Alexander, Natalie, DO   6 each at 09/19/23 3086   enoxaparin  (LOVENOX ) injection 40 mg  40 mg Subcutaneous Q24H Alexander, Natalie, DO   40 mg at 09/18/23 2232   feeding supplement (NEPRO CARB STEADY) liquid 237 mL  237 mL Oral TID BM Melodi Sprung, DO   237 mL at 09/19/23 0938   HYDROcodone  bit-homatropine (HYCODAN) 5-1.5 MG/5ML syrup 5 mL  5 mL Oral Q4H PRN Rosea Conch, Isami, DO   5 mL at 09/18/23 1255   insulin  aspart (novoLOG ) injection 0-9 Units  0-9 Units Subcutaneous Q8H Sakai, Isami, DO   1 Units at 09/15/23 0856   ipratropium-albuterol  (DUONEB) 0.5-2.5 (3) MG/3ML nebulizer solution 3 mL  3 mL Inhalation BID Melodi Sprung, DO   3 mL at 09/19/23 0730   lactulose  (CHRONULAC ) 10 GM/15ML solution 20 g  20 g Oral Daily Sakai, Isami, DO   20 g at 09/19/23 0831   lamoTRIgine  (LAMICTAL ) tablet 50 mg  50 mg Oral BID Sakai, Isami, DO   50 mg at 09/19/23 5784   levETIRAcetam  (KEPPRA ) tablet 1,000 mg  1,000 mg Oral BID Rosea Conch, Isami, DO   1,000 mg at 09/19/23 0831   LORazepam  (ATIVAN ) injection 1 mg  1 mg Intravenous Q6H PRN Rosea Conch, Isami, DO   1 mg at 09/16/23 1741   melatonin tablet 10 mg  10 mg Oral QHS Sakai, Isami, DO   10 mg at 09/18/23 2224   metoCLOPramide  (REGLAN ) injection 10 mg  10 mg Intravenous Q6H PRN Rosea Conch, Isami, DO   10 mg at 09/15/23 6962   mometasone -formoterol  (DULERA ) 200-5 MCG/ACT inhaler 2 puff  2 puff Inhalation BID Rosea Conch, Isami, DO   2 puff at 09/19/23 9528   multivitamin with minerals tablet 1 tablet  1 tablet Oral Daily Alexander, Natalie, DO   1 tablet at 09/19/23 4132   oxyCODONE  (Oxy IR/ROXICODONE ) immediate release tablet 5 mg  5 mg Oral Q6H PRN Brenna Cam, MD       pantoprazole  (PROTONIX ) injection 40 mg  40 mg Intravenous Q12H Sakai, Isami, DO   40 mg at 09/19/23 0833   polyethylene glycol (MIRALAX  / GLYCOLAX )  packet 17 g  17 g Oral BID Sakai, Isami, DO   17 g at 09/19/23 4401   pregabalin  (LYRICA ) capsule 100 mg  100 mg Oral Daily Alexander, Natalie, DO   100 mg at 09/19/23 0272   pregabalin  (LYRICA ) capsule 200 mg  200 mg Oral QHS Sakai, Isami, DO   200 mg at 09/18/23 2236   senna (SENOKOT) tablet 17.2 mg  2 tablet Oral QHS PRN Rosea Conch, Isami, DO       sodium chloride  flush (NS) 0.9 % injection 10-40 mL  10-40 mL Intracatheter Q12H Sakai, Isami, DO   40 mL at 09/19/23 5366   sodium chloride  flush (NS) 0.9 % injection 10-40 mL  10-40 mL Intracatheter PRN Sakai, Isami, DO  sodium chloride  flush (NS) 0.9 % injection 3-10 mL  3-10 mL Intravenous Q12H Brenna Cam, MD       sodium chloride  flush (NS) 0.9 % injection 3-10 mL  3-10 mL Intravenous PRN Brenna Cam, MD       tamsulosin  (FLOMAX ) capsule 0.4 mg  0.4 mg Oral QPM Sakai, Isami, DO   0.4 mg at 09/18/23 1648     Discharge Medications: Please see discharge summary for a list of discharge medications.  Relevant Imaging Results:  Relevant Lab Results:   Additional Information [id number]  Dorlene Footman A Clearence Vitug, RN

## 2023-09-20 DIAGNOSIS — K5981 Ogilvie syndrome: Secondary | ICD-10-CM | POA: Diagnosis not present

## 2023-09-20 DIAGNOSIS — I4892 Unspecified atrial flutter: Secondary | ICD-10-CM | POA: Diagnosis not present

## 2023-09-20 DIAGNOSIS — J9601 Acute respiratory failure with hypoxia: Secondary | ICD-10-CM | POA: Diagnosis not present

## 2023-09-20 DIAGNOSIS — R569 Unspecified convulsions: Secondary | ICD-10-CM | POA: Diagnosis not present

## 2023-09-20 DIAGNOSIS — Z7189 Other specified counseling: Secondary | ICD-10-CM | POA: Diagnosis not present

## 2023-09-20 LAB — CBC
HCT: 31.2 % — ABNORMAL LOW (ref 39.0–52.0)
Hemoglobin: 9.6 g/dL — ABNORMAL LOW (ref 13.0–17.0)
MCH: 28.8 pg (ref 26.0–34.0)
MCHC: 30.8 g/dL (ref 30.0–36.0)
MCV: 93.7 fL (ref 80.0–100.0)
Platelets: 264 10*3/uL (ref 150–400)
RBC: 3.33 MIL/uL — ABNORMAL LOW (ref 4.22–5.81)
RDW: 14.6 % (ref 11.5–15.5)
WBC: 11.8 10*3/uL — ABNORMAL HIGH (ref 4.0–10.5)
nRBC: 0 % (ref 0.0–0.2)

## 2023-09-20 LAB — BASIC METABOLIC PANEL
Anion gap: 11 (ref 5–15)
BUN: 13 mg/dL (ref 8–23)
CO2: 25 mmol/L (ref 22–32)
Calcium: 8.3 mg/dL — ABNORMAL LOW (ref 8.9–10.3)
Chloride: 102 mmol/L (ref 98–111)
Creatinine, Ser: 1.16 mg/dL (ref 0.61–1.24)
GFR, Estimated: >60 mL/min (ref >60)
Glucose, Bld: 105 mg/dL — ABNORMAL HIGH (ref 70–99)
Potassium: 4.2 mmol/L (ref 3.5–5.1)
Sodium: 138 mmol/L (ref 135–145)

## 2023-09-20 LAB — GLUCOSE, CAPILLARY
Glucose-Capillary: 110 mg/dL — ABNORMAL HIGH (ref 70–99)
Glucose-Capillary: 100 mg/dL — ABNORMAL HIGH (ref 70–99)
Glucose-Capillary: 90 mg/dL (ref 70–99)
Glucose-Capillary: 82 mg/dL (ref 70–99)

## 2023-09-20 MED ORDER — SODIUM CHLORIDE 0.9 % IV SOLN: INTRAVENOUS | Status: AC

## 2023-09-20 MED ORDER — PANTOPRAZOLE SODIUM 40 MG PO TBEC
40.0000 mg | DELAYED_RELEASE_TABLET | Freq: Two times a day (BID) | ORAL | Status: DC
  Administered 2023-09-20: 40 mg via ORAL
  Filled 2023-09-20: qty 1

## 2023-09-20 NOTE — Plan of Care (Signed)
  Problem: Clinical Measurements: Goal: Ability to maintain clinical measurements within normal limits will improve Outcome: Progressing   Problem: Activity: Goal: Risk for activity intolerance will decrease Outcome: Progressing   Problem: Elimination: Goal: Will not experience complications related to bowel motility Outcome: Progressing   Problem: Pain Management: Goal: General experience of comfort will improve Outcome: Progressing   Problem: Safety: Goal: Ability to remain free from injury will improve Outcome: Progressing   Problem: Skin Integrity: Goal: Risk for impaired skin integrity will decrease Outcome: Progressing   Problem: Health Behavior/Discharge Planning: Goal: Ability to manage health-related needs will improve Outcome: Not Progressing: continue with continuous shouting   Problem: Nutrition: Goal: Adequate nutrition will be maintained Outcome: Not Progressing: refusing po   Problem: Coping: Goal: Level of anxiety will decrease Outcome: Not Progressing    Problem: Activity: Goal: Ability to tolerate increased activity will improve Outcome: Not Progressing/ does not want to be touch

## 2023-09-20 NOTE — Progress Notes (Signed)
PROGRESS NOTE    Calvin [name]   RUE:[id number] DOB: 07-01-1951  DOA: 09/03/2023 Date of Service: 09/20/23 which is hospital day 17  PCP: Keane Police, MD    Hospital course / significant events:   HPI: Calvin [name] is a 73 y.o. 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   12/30: admitted to hospitalist sepsis, AKI, (+)Influenza A, likely aspiration pneumonia, acute hypoxic resp fail, SBO. NG placed, surgical consult.  12/31-01/06: relatively stable, remains SBO w/ NG and on TPN, worsening colostomy prolapse. Anemia w/o overt GIB, holding anti-plt/anti-cogs, GI no plans for endoscopic eval at this time 01/07: exlap, LOA, and colostomy revision  01/08: stable.  01/09: d/c NG 01/10: advancing diet as able. Hgb 6.8 - 1 unit PRBC given  01/11: persistent abd pain and cough, imaging concerning for abdominal surgical wound dehiscence deeper layers, going back to OR. Continue w/ cough suppression, await over-read but no obvious CT chest findings, suspect post-viral bronchitis  01/12: to OR early AM for closure of wound dehiscence. Postop stable, NG in place. CT chest question pneumonia but pt reports coughing improved, suspect was mostly reflux, will continue to monitor cough. Postop care per general surgery include clamp NG and remain NPO for now   01/13: NG out, CLD 01/14: FLD, continue advancing diet tolerated  1/15: Advanced to dysphagia diet after ST eval.  Discontinue telemetry, palliative care consult, DC central line 1/16: Underwent CT abdomen/pelvis last evening.  Not a candidate for another surgery due to poor functional and nutritional status and multiple recent procedures.  Family  deciding on goals of care/hospice   Consultants:  General surgery   Procedures/Surgeries: 09/11/23: exlap, LOA, and colostomy revision  09/16/23: closure of wound dehiscence   ASSESSMENT & PLAN:   Flu A CAP improving, s/p tamiflu and abx for CAP/aspiration No clearly worsening pneumonia on CT despite cough, see below, no indication for abx for pneumonitis, hold abx   SBO vs ileus History ogilvie syndrome s/p colostomy. s/p ex-lab with lysis of adhesions on 1/7 S/p wound dehiscence repair 1/12 NG out, advanced diet to dysphagia 1 per speech therapy evaluation.  Underwent CT abdomen/pelvis yesterday due to some concern for worsening wound.  It did not show any clear evidence of evisceration.  May have mild fascial dehiscence per surgery review.  He is not a good candidate for another surgery considering multiple recent procedures, poor functional and nutritional status.  Continue abdominal binder and dressing changes General surgery following  Advised pt goal for limiting/reducing opiates d/t goal avoid ileus / promote bowel function, family voices encouragement of this as well, work on reducing opiates --> continue dilaudid for breakthrough, oxy-APA 5-10 mg po q4h prn mod-severe, increased lyrica from 200 mg at bedtime only to that plus lyrica 100 mg q AM  Cough, multifactorial Postviral/post-infectious component is likely Given CT findings of esophageal fluid/dilation, suspect reflux and/or aspiration pneumonitis is also likely especially since improvement w/ surgery and re-placement of NG Hx COPD but no apparent exacerbation No clearly worsening pneumonia, no indication for abx for pneumonitis, hold abx  Monitor sirs/sepsis parameters  Cough suppressants  PPI  Acute hypoxic respiratory failure Due to above  wean [place] O2 as able.  Currently on 2 L oxygen via  nasal cannula   Anemia continue bid ppi On Lovenox for DVT prophylaxis trend hgb/CBC transfuse for Hgb <7   Colostomy  irritation/prolapse gen surg following  mild bleeding from irritate/edematous tissue, ok to monitor.  Not a surgical candidate due to his poor functional and nutritional status and multiple recent procedures   COPD No acute exacerbation S/p 6 days prednisone Nebs as needed.  Wean off oxygen   AKI on ckd 2 resolved Prerenal 2/2 dehydration monitor BMP   Seizure disorder With recent hospitalization for breakthrough seizures continue keppra, home lamictal and pregabalin   hx CVA  With hemiplegia hold asa given anemia/bleeding risk as above   BPH  cont home flomax   Chronic HFpEF Dehydrated as above judicious IV fluids if needed   Debility Resides at snf PT at LTC  Goals of care I had a long discussion with patient's daughter/[name].  She also had talk with palliative care/Crystal.  She is planning on have her talk with remaining family members to see if everyone is agreeable for comfort care/hospice.  He seems appropriate for hospice    DVT prophylaxis: SCD IV fluids: no continuous IV fluids  Nutrition: Dysphagia 1 diet advance per general surgery Central lines / invasive devices: Remove central line.  NG tube discontinued already.  Discontinue telemetry.  Keep PICC line for now  Code Status: FULL CODE ACP documentation reviewed:  copy of DNR and MOST on file in [facility], DNR was voided dated 2021, MOST notes FULL CODE dated 2023 do DNR removed   TOC needs: Currently pending opinion family decision on goals of care Barriers to dispo / significant pending items: advancing diet, surgical clearance prior to dc      Subjective / Brief ROS:   He denies any new complaint.  Shares that he is tired  Family Communication: No family at bed side    Objective Findings:  Vitals:   09/20/23 0415 09/20/23 0500 09/20/23 0756 09/20/23 1203  BP: 123/76  102/63 118/69  Pulse: (!) 110  (!) 102 93  Resp: 18  17 20   Temp: 98.5 F (36.9 C)  98.6 F (37 C) 97.9 F (36.6 C)   TempSrc:      SpO2: 99%  98% 94%  Weight:  92.2 kg    Height:        Intake/Output Summary (Last 24 hours) at 09/20/2023 1205 Last data filed at 09/20/2023 0500 Gross per 24 hour  Intake 0 ml  Output 400 ml  Net -400 ml   Filed Weights   09/18/23 2010 09/18/23 2100 09/20/23 0500  Weight: 94 kg 94 kg 92.2 kg    Examination:  Physical Exam Constitutional:      General: He is not in acute distress. Cardiovascular:     Rate and Rhythm: Normal rate and regular rhythm.  Pulmonary:     Effort: No respiratory distress.     Breath sounds: No rhonchi (scattered coarse breath sounds).  Abdominal:     General: A surgical scar is present. Bowel sounds are normal. There is distension (much improved from prior exams but still "full").     Palpations: Abdomen is soft.     Tenderness: Tenderness: incision site, less severe compared to yesterday.     Comments: Abdominal binder in place  Skin:    General: Skin is warm and dry.  Neurological:     Mental Status: He is alert.  Psychiatric:        Mood and Affect: Mood normal.  Behavior: Behavior normal.          Scheduled Medications:   acetaminophen  650 mg Oral Q6H   baclofen  5 mg Oral BID   benzonatate  200 mg Oral TID   budesonide (PULMICORT) nebulizer solution  0.25 mg Nebulization BID   Chlorhexidine Gluconate Cloth  6 each Topical Daily   enoxaparin (LOVENOX) injection  40 mg Subcutaneous Q24H   feeding supplement (NEPRO CARB STEADY)  237 mL Oral TID BM   insulin aspart  0-9 Units Subcutaneous Q8H   ipratropium-albuterol  3 mL Inhalation BID   lactulose  20 g Oral Daily   lamoTRIgine  50 mg Oral BID   levETIRAcetam  1,000 mg Oral BID   melatonin  10 mg Oral QHS   mometasone-formoterol  2 puff Inhalation BID   multivitamin with minerals  1 tablet Oral Daily   pantoprazole (PROTONIX) IV  40 mg Intravenous Q12H   polyethylene glycol  17 g Oral BID   pregabalin  100 mg Oral Daily   pregabalin  200 mg Oral QHS    sodium chloride flush  10-40 mL Intracatheter Q12H   sodium chloride flush  3-10 mL Intravenous Q12H   tamsulosin  0.4 mg Oral QPM    Continuous Infusions:    PRN Medications:  HYDROcodone bit-homatropine, HYDROmorphone (DILAUDID) injection, LORazepam, metoCLOPramide (REGLAN) injection, oxyCODONE, senna, sodium chloride flush, sodium chloride flush  Antimicrobials from admission:  Anti-infectives (From admission, onward)    Start     Dose/Rate Route Frequency Ordered Stop   09/11/23 2000  cefoTEtan (CEFOTAN) 2 g in sodium chloride 0.9 % 100 mL IVPB        2 g 200 mL/hr over 30 Minutes Intravenous Every 8 hours 09/11/23 1801 09/12/23 1910   09/11/23 0600  cefoTEtan (CEFOTAN) 2 g in sodium chloride 0.9 % 100 mL IVPB        2 g 200 mL/hr over 30 Minutes Intravenous On call to O.R. 09/10/23 0858 09/12/23 1910   09/04/23 1200  Ampicillin-Sulbactam (UNASYN) 3 g in sodium chloride 0.9 % 100 mL IVPB  Status:  Discontinued        3 g 200 mL/hr over 30 Minutes Intravenous Every 6 hours 09/04/23 1052 09/08/23 1537   09/03/23 1800  ceFEPIme (MAXIPIME) 2 g in sodium chloride 0.9 % 100 mL IVPB  Status:  Discontinued        2 g 200 mL/hr over 30 Minutes Intravenous Every 12 hours 09/03/23 0812 09/04/23 0903   09/03/23 1500  metroNIDAZOLE (FLAGYL) IVPB 500 mg  Status:  Discontinued        500 mg 100 mL/hr over 60 Minutes Intravenous Every 12 hours 09/03/23 0758 09/04/23 0903   09/03/23 1000  oseltamivir (TAMIFLU) capsule 30 mg        30 mg Oral 2 times daily 09/03/23 0757 09/08/23 0959   09/03/23 0315  ceFEPIme (MAXIPIME) 2 g in sodium chloride 0.9 % 100 mL IVPB        2 g 200 mL/hr over 30 Minutes Intravenous  Once 09/03/23 0304 09/03/23 0441   09/03/23 0315  metroNIDAZOLE (FLAGYL) IVPB 500 mg        500 mg 100 mL/hr over 60 Minutes Intravenous  Once 09/03/23 0304 09/03/23 0441   09/03/23 0315  vancomycin (VANCOCIN) IVPB 1000 mg/200 mL premix        1,000 mg 200 mL/hr over 60 Minutes  Intravenous  Once 09/03/23 0304 09/03/23 0441  Data Reviewed:  I have personally reviewed the following...  CBC: Recent Labs  Lab 09/16/23 0640 09/17/23 0525 09/18/23 0437 09/19/23 0657 09/20/23 0615  WBC 9.9 8.1 7.2 8.3 11.8*  HGB 9.0* 8.5* 10.0* 9.2* 9.6*  HCT 29.2* 28.0* 32.3* 28.8* 31.2*  MCV 93.6 94.0 93.6 91.1 93.7  PLT 173 159 209 216 264   Basic Metabolic Panel: Recent Labs  Lab 09/16/23 0756 09/17/23 0525 09/18/23 0437 09/19/23 0657 09/20/23 0615  NA 134* 140 136 137 138  K 6.0* 5.6* 4.7 4.1 4.2  CL 104 106 103 101 102  CO2 24 24 24 25 25   GLUCOSE 129* 78 74 97 105*  BUN 21 20 13 11 13   CREATININE 0.92 0.94 0.99 1.03 1.16  CALCIUM 8.2* 8.3* 8.3* 8.2* 8.3*   GFR: Estimated Creatinine Clearance: 64.9 mL/min (by C-G formula based on SCr of 1.16 mg/dL). Liver Function Tests: No results for input(s): "AST", "ALT", "ALKPHOS", "BILITOT", "PROT", "ALBUMIN" in the last 168 hours.  CBG: Recent Labs  Lab 09/19/23 0822 09/19/23 1725 09/19/23 2146 09/19/23 2353 09/20/23 0758  GLUCAP 104* 87 85 71 110*   Lipid Profile: No results for input(s): "CHOL", "HDL", "LDLCALC", "TRIG", "CHOLHDL", "LDLDIRECT" in the last 72 hours. Thyroid Function Tests: No results for input(s): "TSH", "T4TOTAL", "FREET4", "T3FREE", "THYROIDAB" in the last 72 hours. Anemia Panel: No results for input(s): "VITAMINB12", "FOLATE", "FERRITIN", "TIBC", "IRON", "RETICCTPCT" in the last 72 hours. Most Recent Urinalysis On File:     Component Value Date/Time   COLORURINE AMBER (A) 09/03/2023 0543   APPEARANCEUR CLOUDY (A) 09/03/2023 0543   APPEARANCEUR Clear 08/14/2014 0933   LABSPEC 1.026 09/03/2023 0543   LABSPEC 1.015 08/14/2014 0933   PHURINE 5.0 09/03/2023 0543   GLUCOSEU NEGATIVE 09/03/2023 0543   GLUCOSEU Negative 08/14/2014 0933   HGBUR NEGATIVE 09/03/2023 0543   BILIRUBINUR NEGATIVE 09/03/2023 0543   BILIRUBINUR Negative 08/14/2014 0933   KETONESUR NEGATIVE  09/03/2023 0543   PROTEINUR 30 (A) 09/03/2023 0543   UROBILINOGEN 0.2 06/12/2010 0103   NITRITE NEGATIVE 09/03/2023 0543   LEUKOCYTESUR LARGE (A) 09/03/2023 0543   LEUKOCYTESUR Negative 08/14/2014 0933   Sepsis Labs: @LABRCNTIP (procalcitonin:4,lacticidven:4) Microbiology: No results found for this or any previous visit (from the past 240 hours).     Radiology Studies last 3 days: CT ABDOMEN PELVIS WO CONTRAST Result Date: 09/19/2023 CLINICAL DATA:  Postoperative abdominal pain. EXAM: CT ABDOMEN AND PELVIS WITHOUT CONTRAST TECHNIQUE: Multidetector CT imaging of the abdomen and pelvis was performed following the standard protocol without IV contrast. RADIATION DOSE REDUCTION: This exam was performed according to the departmental dose-optimization program which includes automated exposure control, adjustment of the mA and/or kV according to patient size and/or use of iterative reconstruction technique. COMPARISON:  CT of the chest abdomen pelvis dated 09/15/2023. FINDINGS: Evaluation of this exam is limited in the absence of intravenous contrast. Lower chest: There are bibasilar consolidative changes which may represent atelectasis or infiltrate. Coronary vascular calcification of the LAD noted. No intra-abdominal free air or free fluid. Hepatobiliary: The liver is unremarkable. Layering stones within the gallbladder. No pericholecystic fluid or evidence of acute cholecystitis by CT. Pancreas: Unremarkable. No pancreatic ductal dilatation or surrounding inflammatory changes. Spleen: Normal in size without focal abnormality. Adrenals/Urinary Tract: Similar appearance of bilateral adrenal nodules, previously characterized as adenoma. There is no hydronephrosis or nephrolithiasis on either side. The visualized ureters and urinary bladder appear unremarkable. Stomach/Bowel: Postsurgical changes of the bowel with a left lower quadrant loop colostomy. Dilated small bowel loops  in the mid to lower abdomen  measure up to 4.2 cm in caliber. There is air-fluid level throughout the small bowel loops. There is abutment of small bowel to the anterior peritoneal wall in the midline. Although findings may represent postsurgical ileus, obstruction is not excluded. Follow-up with small-bowel series recommended. The appendix is normal. Vascular/Lymphatic: Mild aortoiliac atherosclerotic disease. The IVC is unremarkable. No portal venous gas. There is no adenopathy. Reproductive: The prostate and seminal vesicles are grossly unremarkable. No pelvic mass. Other: Postsurgical changes of ventral hernia repair. Musculoskeletal: Total left hip arthroplasty. Osteopenia with degenerative changes. No acute osseous pathology. IMPRESSION: 1. Dilated small bowel loops favored to represent postsurgical ileus. Developing obstruction is not excluded. Follow-up with small-bowel series recommended. 2. Status post ventral hernia repair. 3. Postsurgical changes of the bowel with a left lower quadrant loop colostomy. 4. Cholelithiasis. 5. Bibasilar atelectasis or infiltrate. 6.  Aortic Atherosclerosis (ICD10-I70.0). Electronically Signed   By: Elgie Collard M.D.   On: 09/19/2023 18:13    Time spent 35 min     Clelia Trabucco Sherryll Burger, DO [facility] 09/20/2023, 12:05 PM    Dictation software may have been used to generate the above note. Typos may occur and escape review in typed/dictated notes.    If 7PM-7AM, please contact night coverage [email or web address]

## 2023-09-20 NOTE — Progress Notes (Signed)
Physical Therapy Treatment Patient Details Name: Calvin [name] MRN: [id number] DOB: February 24, 1951 Today's Date: 09/20/2023   History of Present Illness Pt is a 73 y.o. male presenting to hospital from nursing facility for abdominal pain, N/V, fever, and hypoxia.  Pt admitted with severe sepsis, acute hypoxic respiratory failure, SBO, AKI on CKD stage II, colostomy prolapse.  (+) Flu.  Hospital course significant for exploratory laparotomy, colostomy revision and LOA (1/7); abdominal wound dehiscence/repair (1/11).  PMH includes h/o CVA with L sided deficitis, COPD, colostomy, seizure disorder, chronic combined HFrEF and HFpEF, CKD, htn, colon CA s/p transverse colectomy and colostomy, L partial hip replacement, throat sx (d/t CA), L CTR.    PT Comments  Pt seen for PT tx with pt received in bed, moaning/groaning throughout session. Pt c/o pain on R ear 2/2 O2 tubing - nurse made aware & placed dressing for extra cushion (pt noted to have some skin breakdown behind top of R ear & nurse aware). PT encouraged pt to sit EOB, assisted pt with moving BLE on/off bed but pt declined further efforts to sit up, noting pain "everywhere". Pt performed BUE AROM chest presses for strengthening. Pt declined eating lunch despite tray being in room. Assisted pt with donning clean gown. Will continue PT efforts as able.    If plan is discharge home, recommend the following: Two people to help with walking and/or transfers;Two people to help with bathing/dressing/bathroom;Assistance with cooking/housework;Assist for transportation;[name]   Can travel by private vehicle     No  Equipment Recommendations  Other (comment) (defer to next venue)    Recommendations for Other Services       Precautions / Restrictions Precautions Precautions: Fall Precaution Comments: Clear liquids; LUQ colostomy Restrictions Weight Bearing Restrictions Per Provider Order: No     Mobility  Bed  Mobility               General bed mobility comments: Assisted pt with moving BLE off EOB, encouraged pt to sit up with pt initially holding to bed rail then letting go, then declines further efforts to sit EOB, noting he's hurting "everywhere".    Transfers                        Ambulation/Gait                   Stairs             Wheelchair Mobility     Tilt Bed    Modified Rankin (Stroke Patients Only)       Balance                                            Cognition Arousal: Alert Behavior During Therapy: Flat affect Overall Cognitive Status: No family/caregiver present to determine baseline cognitive functioning                                 General Comments: Pt oriented to self & location, follows simple commands inconsistently throughout session. Moaning & groaning throughout session.        Exercises Other Exercises Other Exercises: Pt performs BUE shoulder presses x 10 AROM for UE strengthening.    General Comments  Pertinent Vitals/Pain Pain Assessment Pain Assessment: Faces Faces Pain Scale: Hurts even more Pain Location: behind R ear, yelling out throughout session, reporting he hurts "everywhere" -- nurse made aware Pain Descriptors / Indicators: Grimacing, Guarding Pain Intervention(s): Monitored during session, Limited activity within patient's tolerance, Repositioned    Home Living                          Prior Function            PT Goals (current goals can now be found in the care plan section) Acute Rehab PT Goals Patient Stated Goal: to make the pain better, to be able to work on sitting/standing in therapy again PT Goal Formulation: With patient Time For Goal Achievement: 09/27/23 Potential to Achieve Goals: Poor Progress towards PT goals: PT to reassess next treatment    Frequency    Min 1X/week      PT Plan      Co-evaluation               AM-PAC PT "6 Clicks" Mobility   Outcome Measure  Help needed turning from your back to your side while in a flat bed without using bedrails?: Total Help needed moving from lying on your back to sitting on the side of a flat bed without using bedrails?: Total Help needed moving to and from a bed to a chair (including a wheelchair)?: Total Help needed standing up from a chair using your arms (e.g., wheelchair or bedside chair)?: Total Help needed to walk in hospital room?: Total Help needed climbing 3-5 steps with a railing? : Total 6 Click Score: 6    End of Session Equipment Utilized During Treatment: Oxygen Activity Tolerance: Patient limited by pain Patient left: in bed;with call bell/phone within reach;with bed alarm set Nurse Communication: Mobility status PT Visit Diagnosis: Other abnormalities of gait and mobility (R26.89);Muscle weakness (generalized) (M62.81);Pain;Difficulty in walking, not elsewhere classified (R26.2) Pain - Right/Left: Left Pain - part of body:  (ear)     Time: 5284-1324 PT Time Calculation (min) (ACUTE ONLY): 11 min  Charges:    $Therapeutic Activity: 8-22 mins PT General Charges $$ ACUTE PT VISIT: 1 Visit                     Aleda Grana, PT, DPT 09/20/23, 1:51 PM   Sandi Mariscal 09/20/2023, 1:49 PM

## 2023-09-20 NOTE — Progress Notes (Signed)
Daily Progress Note   [name]       Date: 09/20/2023 DOB: December 19, 1950  Age: 73 y.o. MRN#: [id number] Attending Physician: Delfino Lovett, MD Primary Care Physician: Keane Police, MD Admit Date: 09/03/2023  Reason for Consultation/Follow-up: Establishing goals of care  Subjective: Notes and labs reviewed.  In to see patient.  He is currently resting in bed with eyes closed.  He he stires, but does not awaken to voice or touch.  No family at bedside.  Called to speak with his daughter.  His daughter states she is a Lawyer.  She discusses that he has been living in [facility] for around 10 years.  She discusses his colon cancer and dysphagia.  She states he has been declining for a while, but most particularly over the past 6 months.  She discusses his colon cancer and issues with constipation and abdominal pain at baseline.    We discussed his health history, diagnoses both individually and how they affect each other, prognosis, and GOC, EOL wishes disposition and options.  Created space and opportunity for patient  to explore thoughts and feelings regarding current medical information.   A detailed discussion was had today regarding advanced directives.  Concepts specific to code status, artifical feeding and hydration, IV antibiotics and rehospitalization were discussed.  The difference between an aggressive medical intervention path and a comfort care path was discussed.  Values and goals of care important to patient and family were attempted to be elicited.  Discussed limitations of medical interventions to prolong quality of life in some situations and discussed the concept of human mortality.  Patient advises yesterday he is a man of faith.  Daughter states he has  always told her he would want to do what is possible to live as long as possible.  She states ultimately she would want him to make decisions for himself.  She states she understands his diseases and status and would like for him to be comfortable, if this is what he wants.  She states she will try to talk to him further to discern his wishes.   Length of Stay: 17  Current Medications: Scheduled Meds:   acetaminophen  650 mg Oral Q6H   baclofen  5 mg Oral BID   benzonatate  200 mg Oral TID  budesonide (PULMICORT) nebulizer solution  0.25 mg Nebulization BID   Chlorhexidine Gluconate Cloth  6 each Topical Daily   enoxaparin (LOVENOX) injection  40 mg Subcutaneous Q24H   feeding supplement (NEPRO CARB STEADY)  237 mL Oral TID BM   insulin aspart  0-9 Units Subcutaneous Q8H   ipratropium-albuterol  3 mL Inhalation BID   lactulose  20 g Oral Daily   lamoTRIgine  50 mg Oral BID   levETIRAcetam  1,000 mg Oral BID   melatonin  10 mg Oral QHS   mometasone-formoterol  2 puff Inhalation BID   multivitamin with minerals  1 tablet Oral Daily   pantoprazole (PROTONIX) IV  40 mg Intravenous Q12H   polyethylene glycol  17 g Oral BID   pregabalin  100 mg Oral Daily   pregabalin  200 mg Oral QHS   sodium chloride flush  10-40 mL Intracatheter Q12H   sodium chloride flush  3-10 mL Intravenous Q12H   tamsulosin  0.4 mg Oral QPM    Continuous Infusions:   PRN Meds: HYDROcodone bit-homatropine, HYDROmorphone (DILAUDID) injection, LORazepam, metoCLOPramide (REGLAN) injection, oxyCODONE, senna, sodium chloride flush, sodium chloride flush  Physical Exam Pulmonary:     Effort: Pulmonary effort is normal.  Neurological:     Mental Status: He is alert.             Vital Signs: BP 118/69 (BP Location: Left Arm)   Pulse 93   Temp 97.9 F (36.6 C)   Resp 20   Ht 5\' 11"  (1.803 m)   Wt 92.2 kg   SpO2 94%   BMI 28.35 kg/m  SpO2: SpO2: 94 % O2 Device: O2 Device: Nasal Cannula O2 Flow Rate:  O2 Flow Rate (L/min): 2 L/min  Intake/output summary:  Intake/Output Summary (Last 24 hours) at 09/20/2023 1214 Last data filed at 09/20/2023 0500 Gross per 24 hour  Intake 0 ml  Output 400 ml  Net -400 ml   LBM: Last BM Date : 09/20/23 Baseline Weight: Weight: 97.8 kg Most recent weight: Weight: 92.2 kg        Patient Active Problem List   Diagnosis Date Noted   Small bowel obstruction due to adhesions (HCC) 09/11/2023   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   Cancer of sigmoid (HCC) 09/04/2023   Gout 09/04/2023   Acute respiratory failure with hypoxia (HCC) 09/03/2023   Acute metabolic encephalopathy 08/19/2023   History of CVA (cerebrovascular accident) 08/19/2023   Thrombocytopenia (HCC) 08/19/2023   Hypotension 07/15/2023   Small bowel obstruction (HCC) 07/15/2023   Hemiplegia and 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 side (HCC)    Temporal pain 06/18/2022   Acute kidney injury superimposed on CKD (HCC) 06/17/2022   Dysphagia 06/07/2022   Respiratory distress 06/02/2022   Neuropathy 06/02/2022   Paroxysmal atrial flutter (HCC) 06/01/2022   Aspiration pneumonia (HCC) 06/01/2022   Cervical spinal stenosis 05/31/2022   Chronic diastolic CHF (congestive heart failure) (HCC) 05/28/2022   SBO, recurrent (small bowel obstruction) (HCC)    Abdominal distention    HLD (hyperlipidemia) 10/16/2021   Nausea vomiting and diarrhea 10/16/2021   Sepsis (HCC) 10/16/2021   Tobacco abuse 10/16/2021   Muscle twitching 08/14/2019   UTI (urinary tract infection) 08/03/2019   Ileus (HCC) 08/03/2019   Hypokalemia 08/03/2019   QT prolongation 08/03/2019   Ogilvie syndrome    Acute abdominal pain 04/03/2019   Left-sided weakness 09/30/2017   Seizure (HCC) 09/30/2017   Essential hypertension 09/30/2017   CAD (coronary artery disease) 09/30/2017  COPD (chronic obstructive pulmonary disease) (HCC) 09/30/2017   Depression with anxiety 09/30/2017     Palliative Care Assessment & Plan   Recommendations/Plan: Patient sleeping and unable to talk to me this morning.  Daughter intends to speak with her father regarding his wishes on care.   PMT will follow.  Code Status:    Code Status Orders  (From admission, onward)           Start     Ordered   09/03/23 0754  Full code  Continuous       Question:  By:  Answer:  Consent: discussion documented in EHR   09/03/23 0755           Code Status History     Date Active Date Inactive Code Status Order ID Comments User Context   08/19/2023 1314 08/23/2023 1838 Full Code [id number]  Alan Mulder, MD Inpatient   08/18/2023 1448 08/19/2023 1154 Full Code [id number]  Emeline General, MD ED   07/18/2023 1748 07/24/2023 1712 Full Code [id number]  Kathrynn Running, MD Inpatient   07/15/2023 0538 07/18/2023 1551 Full Code [id number]  Andris Baumann, MD ED   06/22/2023 0939 06/27/2023 0108 Full Code [id number]  Emeline General, MD ED   05/28/2022 1622 06/30/2022 2326 Full Code [id number]  Lorretta Harp, MD ED   10/23/2021 2158 11/02/2021 2029 Full Code [id number]  Lajoyce Corners, NP Inpatient   10/16/2021 1144 10/23/2021 2158 DNR [id number]  Lorretta Harp, MD ED   10/02/2021 0731 10/02/2021 1641 DNR [id number]  Loleta Rose, MD ED   08/03/2019 2226 08/14/2019 2350 Full Code [id number]  Lurene Shadow, MD Inpatient   04/03/2019 1711 04/08/2019 1731 Full Code [id number]  Auburn Bilberry, MD Inpatient   04/03/2019 1627 04/03/2019 1710 DNR [id number]  Auburn Bilberry, MD ED   09/30/2017 2301 10/02/2017 2039 Full Code [id number]  Oralia Manis, MD Inpatient       Prognosis: Poor   Care plan was discussed with attending  Thank you for allowing the Palliative Medicine Team to assist in the care of this patient.   Morton Stall, NP  Please contact Palliative Medicine Team phone at [phone] for questions and concerns.

## 2023-09-20 NOTE — Consult Note (Signed)
WOC Nurse ostomy follow up Contacted by staff pouch has been leaking during the day at 5-6 o'clock, nursing unsure why Stoma type/location: LLQ, colostomy and MF Stomal assessment/size: greater than 2", edematous, os at center, large area of mucosa at mucocutaneous junction from 12-5 o'clock  Peristomal assessment:  intact Treatment options for stomal/peristomal skin: using multiple 2" barrier rings to place continuous barrier ring to the edge of the stoma and mucosa to aid in seal  Output liquid brown Ostomy pouching: 2pc. 2 3/4 cut as large as possible to avoid 2pc flange on skin barrier, placed over barrier rings as described above.  Education provided: none, patient is completely dependent in ostomy care. Not cognitively able to care for stoma.  Enrolled patient in [facility] Secure Start Discharge program: Yes  Suggested use of 4" ostomy pouch, however difficulty obtained from materials, reported to only be stocked in OR.  WOC retrieved 2 pouchs from [facility] stock cabinet to take to bedside for nurse use should pouch I placed leak.  Due to size of stoma 4" pouch will be best to accommodate stoma and mucosa without damage to mucosa.   When I arrived back to floor with pouches, beside nursing stated OR is to "tube" up 2 pouches as well. These can be sent with patient at dc to SNF, for their use until they can obtain accurate size.   [place] # [phone] 4" skin barrier/[place] [id number] pouch  WOC Nurse team will follow with you and see patient within 10 days for ostomy assessments.  Please notify WOC nurses of any acute changes in the ostomy care or any new areas of concern Skylen Spiering [facility] MSN, RN,CWOCN, CNS, CWON-AP [phone]

## 2023-09-20 NOTE — TOC Progression Note (Addendum)
Transition of Care [facility]) - Progression Note    Patient Details  Name: Calvin [name] MRN: [id number] Date of Birth: May 19, 1951  Transition of Care [facility]) CM/SW Contact  Garret Reddish, RN Phone Number: 09/20/2023, 11:52 AM  Clinical Narrative:    Chart reviewed.  Noted new findings on CT with possible small areas of fascial dehiscence.  Surgery to manage conservatively at this time.  May require a few more days in hospital due to new finding.    TOC will continue to follow for discharge planning.    Expected Discharge Plan: Long Term Nursing Home Barriers to Discharge: Continued Medical Work up  Expected Discharge Plan and Services                                               Social Determinants of Health (SDOH) Interventions SDOH Screenings   Food Insecurity: No Food Insecurity (09/11/2023)  Housing: Low Risk  (09/11/2023)  Transportation Needs: No Transportation Needs (09/11/2023)  Utilities: Not At Risk (09/11/2023)  Social Connections: Unknown (09/11/2023)  Tobacco Use: Medium Risk (09/03/2023)    Readmission Risk Interventions    08/20/2023   12:32 PM 07/17/2023   12:24 PM 06/30/2022    2:25 PM  Readmission Risk Prevention Plan  Transportation Screening Complete Complete Complete  PCP or Specialist Appt within 3-5 Days  Complete   Social Work Consult for Recovery Care Planning/Counseling  Complete   Palliative Care Screening  Not Applicable   Medication Review Oceanographer) Complete Complete   PCP or Specialist appointment within 3-5 days of discharge Complete    HRI or Home Care Consult Complete  Complete  SW Recovery Care/Counseling Consult Complete  Complete  Palliative Care Screening Not Applicable  Complete  Skilled Nursing Facility Complete  Complete

## 2023-09-20 NOTE — Progress Notes (Signed)
[facility] SURGICAL PROGRESS NOTE  Hospital Day(s): 17.   Post op day(s): 5 Days Post-Op.   Interval History:  Patient seen and examined No acute events or new complaints overnight.  Did undergo CT yesterday secondary to drainage from midline; no clear evisceration, may have small areas of fascial dehiscence, not clear cut.  Patient resting well this morning; no complaints  No fever, chills, nausea, emesis  Leukocytosis to 11.8K this AM Hgb to 9.6 sCr - 1.16; UO - 450 ccs + unmeasured  No electrolyte derangements Colostomy with gas and stool in bag; 400 ccs + unmeasured  He is on DYS 1; tolerating   Vital signs in last 24 hours: [min-max] current  Temp:  [98.1 F (36.7 C)-98.5 F (36.9 C)] 98.5 F (36.9 C) (01/16 0415) Pulse Rate:  [95-110] 110 (01/16 0415) Resp:  [16-18] 18 (01/16 0415) BP: (104-133)/(69-76) 123/76 (01/16 0415) SpO2:  [94 %-100 %] 99 % (01/16 0415) Weight:  [92.2 kg] 92.2 kg (01/16 0500)     Height: 5\' 11"  (180.3 cm) Weight: 92.2 kg BMI (Calculated): 28.36   Intake/Output last 2 shifts:  01/15 0701 - 01/16 0700 In: 120 [P.O.:120] Out: 600 [Urine:200; Stool:400]   Physical Exam:  Constitutional: alert, cooperative and no distress   Respiratory: breathing non-labored at rest Cardiovascular: regular rate and sinus rhythm  Gastrointestinal: soft, incisional soreness, and non-distended. No rebound/guarding. 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 Now with abdominal binder in place Genitourinary: Condom catheter in place  Integumentary: Laparotomy is CDI with staples and now with retention sutures, serous drainage present on dressing  Labs:     Latest Ref Rng & Units 09/20/2023    6:15 AM 09/19/2023    6:57 AM 09/18/2023    4:37 AM  CBC  WBC 4.0 - 10.5 K/uL 11.8  8.3  7.2   Hemoglobin 13.0 - 17.0 g/dL 9.6  9.2  95.2   Hematocrit 39.0 - 52.0 % 31.2  28.8  32.3   Platelets 150 - 400 K/uL 264  216  209        Latest Ref Rng & Units 09/19/2023    6:57 AM 09/18/2023    4:37 AM 09/17/2023    5:25 AM  CMP  Glucose 70 - 99 mg/dL 97  74  78   BUN 8 - 23 mg/dL 11  13  20    Creatinine 0.61 - 1.24 mg/dL 8.41  3.24  4.01   Sodium 135 - 145 mmol/L 137  136  140   Potassium 3.5 - 5.1 mmol/L 4.1  4.7  5.6   Chloride 98 - 111 mmol/L 101  103  106   CO2 22 - 32 mmol/L 25  24  24    Calcium 8.9 - 10.3 mg/dL 8.2  8.3  8.3      Imaging studies: No new pertinent imaging studies   Assessment/Plan: 73 y.o. male 5 Days Post-Op closure of wound dehiscence with rentention sutures s/p initial exploratory laparotomy, LOA, and colostomy revision on 01/07   - Reviewed CT Abdomen/Pelvis from yesterday, no clear cut evidence of evisceration, may have some mild fascial dehiscence to the inferior aspect. Unfortunately, he is a very sub-optimal candidate for take back and further closure attempts given his multiple recent procedures and poor functional and nutritional state. For now, would manage this conservatively, continue abdominal binder, dressing changes.    - Continue DYS 1 diet as tolerated   - Monitor H&H; stable  -  Monitor abdominal examination  - Continue abdominal binder strictly   - Wound care: Cover with superficial gauze and ABD pad as needed. Change daily and as needed   - Appreciate WOC RN care with ostomy  - Retention sutures will need to be maintained for 4-6 weeks - Monitor colostomy output; record - Pain control prn; antiemetics prn  - Okay to work with therapies - Further management per primary service; we will follow   All of the above findings and recommendations were discussed with the patient, and the medical team, and all of patient's questions were answered to his expressed satisfaction.  -- Lynden Oxford, PA-C [facility] 09/20/2023, 7:13 AM M-F: 7am - 4pm

## 2023-09-20 NOTE — Progress Notes (Signed)
PHARMACIST - PHYSICIAN COMMUNICATION  DR:   Sherryll Burger  CONCERNING: IV to Oral Route Change Policy  RECOMMENDATION: This patient is receiving pantoprazole by the intravenous route.  Based on criteria approved by the Pharmacy and Therapeutics Committee, the intravenous medication(s) is/are being converted to the equivalent oral dose form(s).   DESCRIPTION: These criteria include: The patient is eating (either orally or via tube) and/or has been taking other orally administered medications for a least 24 hours The patient has no evidence of active gastrointestinal bleeding or impaired GI absorption (gastrectomy, short bowel, patient on TNA or NPO).  If you have questions about this conversion, please contact the Pharmacy Department   Barrie Folk, [facility] 09/20/2023 12:55 PM

## 2023-09-21 ENCOUNTER — Other Ambulatory Visit: Payer: Self-pay

## 2023-09-21 ENCOUNTER — Inpatient Hospital Stay: Payer: Medicare Other

## 2023-09-21 DIAGNOSIS — A419 Sepsis, unspecified organism: Secondary | ICD-10-CM

## 2023-09-21 DIAGNOSIS — Z7189 Other specified counseling: Secondary | ICD-10-CM | POA: Diagnosis not present

## 2023-09-21 DIAGNOSIS — T17908A Unspecified foreign body in respiratory tract, part unspecified causing other injury, initial encounter: Secondary | ICD-10-CM | POA: Diagnosis not present

## 2023-09-21 DIAGNOSIS — Z8673 Personal history of transient ischemic attack (TIA), and cerebral infarction without residual deficits: Secondary | ICD-10-CM

## 2023-09-21 DIAGNOSIS — I2510 Atherosclerotic heart disease of native coronary artery without angina pectoris: Secondary | ICD-10-CM

## 2023-09-21 DIAGNOSIS — R569 Unspecified convulsions: Secondary | ICD-10-CM

## 2023-09-21 DIAGNOSIS — I5032 Chronic diastolic (congestive) heart failure: Secondary | ICD-10-CM

## 2023-09-21 DIAGNOSIS — J9601 Acute respiratory failure with hypoxia: Secondary | ICD-10-CM

## 2023-09-21 DIAGNOSIS — J449 Chronic obstructive pulmonary disease, unspecified: Secondary | ICD-10-CM

## 2023-09-21 DIAGNOSIS — I4892 Unspecified atrial flutter: Secondary | ICD-10-CM

## 2023-09-21 LAB — COMPREHENSIVE METABOLIC PANEL
ALT: 48 U/L — ABNORMAL HIGH (ref 0–44)
AST: 38 U/L (ref 15–41)
Albumin: 2.6 g/dL — ABNORMAL LOW (ref 3.5–5.0)
Alkaline Phosphatase: 133 U/L — ABNORMAL HIGH (ref 38–126)
Anion gap: 12 (ref 5–15)
BUN: 19 mg/dL (ref 8–23)
CO2: 23 mmol/L (ref 22–32)
Calcium: 8.1 mg/dL — ABNORMAL LOW (ref 8.9–10.3)
Chloride: 105 mmol/L (ref 98–111)
Creatinine, Ser: 1.75 mg/dL — ABNORMAL HIGH (ref 0.61–1.24)
GFR, Estimated: 40 mL/min — ABNORMAL LOW (ref >60)
Glucose, Bld: 138 mg/dL — ABNORMAL HIGH (ref 70–99)
Potassium: 4.8 mmol/L (ref 3.5–5.1)
Sodium: 140 mmol/L (ref 135–145)
Total Bilirubin: 0.8 mg/dL (ref 0.0–1.2)
Total Protein: 7 g/dL (ref 6.5–8.1)

## 2023-09-21 LAB — CBC
HCT: 35.6 % — ABNORMAL LOW (ref 39.0–52.0)
Hemoglobin: 10.6 g/dL — ABNORMAL LOW (ref 13.0–17.0)
MCH: 28.2 pg (ref 26.0–34.0)
MCHC: 29.8 g/dL — ABNORMAL LOW (ref 30.0–36.0)
MCV: 94.7 fL (ref 80.0–100.0)
Platelets: 354 10*3/uL (ref 150–400)
RBC: 3.76 MIL/uL — ABNORMAL LOW (ref 4.22–5.81)
RDW: 14.8 % (ref 11.5–15.5)
WBC: 16.8 10*3/uL — ABNORMAL HIGH (ref 4.0–10.5)
nRBC: 0 % (ref 0.0–0.2)

## 2023-09-21 LAB — BLOOD GAS, ARTERIAL
Acid-base deficit: 4.4 mmol/L — ABNORMAL HIGH (ref 0.0–2.0)
Bicarbonate: 22.9 mmol/L (ref 20.0–28.0)
FIO2: 0.6 %
MECHVT: 500 mL
O2 Saturation: 91.2 %
PEEP: 5 cmH2O
Patient temperature: 37
RATE: 18 {breaths}/min
pCO2 arterial: 51 mm[Hg] — ABNORMAL HIGH (ref 32–48)
pH, Arterial: 7.26 — ABNORMAL LOW (ref 7.35–7.45)
pO2, Arterial: 67 mm[Hg] — ABNORMAL LOW (ref 83–108)

## 2023-09-21 LAB — TROPONIN I (HIGH SENSITIVITY)
Troponin I (High Sensitivity): 25 ng/L — ABNORMAL HIGH (ref <18)
Troponin I (High Sensitivity): 25 ng/L — ABNORMAL HIGH (ref <18)

## 2023-09-21 LAB — GLUCOSE, CAPILLARY
Glucose-Capillary: 107 mg/dL — ABNORMAL HIGH (ref 70–99)
Glucose-Capillary: 145 mg/dL — ABNORMAL HIGH (ref 70–99)
Glucose-Capillary: 129 mg/dL — ABNORMAL HIGH (ref 70–99)
Glucose-Capillary: 120 mg/dL — ABNORMAL HIGH (ref 70–99)
Glucose-Capillary: 90 mg/dL (ref 70–99)
Glucose-Capillary: 111 mg/dL — ABNORMAL HIGH (ref 70–99)

## 2023-09-21 LAB — LACTIC ACID, PLASMA
Lactic Acid, Venous: 1.2 mmol/L (ref 0.5–1.9)
Lactic Acid, Venous: 2 mmol/L (ref 0.5–1.9)

## 2023-09-21 MED ORDER — LACTATED RINGERS IV BOLUS
500.0000 mL | Freq: Once | INTRAVENOUS | Status: AC
  Administered 2023-09-21: 500 mL via INTRAVENOUS

## 2023-09-21 MED ORDER — FENTANYL CITRATE (PF) 100 MCG/2ML IJ SOLN
100.0000 ug | Freq: Once | INTRAMUSCULAR | Status: AC
Start: 2023-09-21 — End: 2023-09-21

## 2023-09-21 MED ORDER — ETOMIDATE 2 MG/ML IV SOLN
INTRAVENOUS | Status: AC
  Administered 2023-09-21: 20 mg via INTRAVENOUS
  Filled 2023-09-21: qty 10

## 2023-09-21 MED ORDER — NOREPINEPHRINE 4 MG/250ML-% IV SOLN
INTRAVENOUS | Status: AC
  Administered 2023-09-21: 10 ug/min via INTRAVENOUS
  Filled 2023-09-21: qty 250

## 2023-09-21 MED ORDER — POLYETHYLENE GLYCOL 3350 17 G PO PACK
17.0000 g | PACK | Freq: Every day | ORAL | Status: DC
  Filled 2023-09-21: qty 1

## 2023-09-21 MED ORDER — FENTANYL BOLUS VIA INFUSION
25.0000 ug | INTRAVENOUS | Status: DC | PRN
Start: 2023-09-21 — End: 2023-09-22
  Administered 2023-09-21 (×2): 50 ug via INTRAVENOUS
  Administered 2023-09-21 – 2023-09-22 (×4): 100 ug via INTRAVENOUS
  Administered 2023-09-22 (×2): 75 ug via INTRAVENOUS

## 2023-09-21 MED ORDER — ORAL CARE MOUTH RINSE
15.0000 mL | OROMUCOSAL | Status: DC
  Administered 2023-09-21 – 2023-09-22 (×16): 15 mL via OROMUCOSAL

## 2023-09-21 MED ORDER — NOREPINEPHRINE 4 MG/250ML-% IV SOLN
0.0000 ug/min | INTRAVENOUS | Status: DC
  Administered 2023-09-21: 4 ug/min via INTRAVENOUS
  Administered 2023-09-21: 16 ug/min via INTRAVENOUS
  Administered 2023-09-22: 3 ug/min via INTRAVENOUS
  Filled 2023-09-21 (×3): qty 250

## 2023-09-21 MED ORDER — ROCURONIUM BROMIDE 10 MG/ML (PF) SYRINGE: 100.0000 mg | PREFILLED_SYRINGE | Freq: Once | INTRAVENOUS | Status: AC

## 2023-09-21 MED ORDER — DEXMEDETOMIDINE HCL IN NACL 400 MCG/100ML IV SOLN
0.0000 ug/kg/h | INTRAVENOUS | Status: DC
  Administered 2023-09-22: 0.4 ug/kg/h via INTRAVENOUS
  Administered 2023-09-22: 0.8 ug/kg/h via INTRAVENOUS
  Administered 2023-09-22: 0.5 ug/kg/h via INTRAVENOUS
  Administered 2023-09-23: 0.6 ug/kg/h via INTRAVENOUS
  Filled 2023-09-21 (×6): qty 100

## 2023-09-21 MED ORDER — MIDAZOLAM HCL 2 MG/2ML IJ SOLN: 4.0000 mg | Freq: Once | INTRAMUSCULAR | Status: AC

## 2023-09-21 MED ORDER — ORAL CARE MOUTH RINSE: 15.0000 mL | OROMUCOSAL | Status: DC | PRN

## 2023-09-21 MED ORDER — FENTANYL CITRATE PF 50 MCG/ML IJ SOSY
25.0000 ug | PREFILLED_SYRINGE | Freq: Once | INTRAMUSCULAR | Status: AC
Start: 2023-09-21 — End: 2023-09-21
  Administered 2023-09-21: 25 ug via INTRAVENOUS

## 2023-09-21 MED ORDER — DOCUSATE SODIUM 50 MG/5ML PO LIQD
100.0000 mg | Freq: Two times a day (BID) | ORAL | Status: DC
  Administered 2023-09-24 – 2023-09-26 (×5): 100 mg
  Filled 2023-09-21 (×6): qty 10

## 2023-09-21 MED ORDER — MIDAZOLAM HCL 2 MG/2ML IJ SOLN
1.0000 mg | INTRAMUSCULAR | Status: DC | PRN
  Administered 2023-09-21 – 2023-09-24 (×6): 2 mg via INTRAVENOUS
  Filled 2023-09-21 (×6): qty 2

## 2023-09-21 MED ORDER — INSULIN ASPART 100 UNIT/ML IJ SOLN
0.0000 [IU] | INTRAMUSCULAR | Status: DC
  Administered 2023-09-21 (×2): 2 [IU] via SUBCUTANEOUS
  Administered 2023-09-22 – 2023-09-23 (×2): 3 [IU] via SUBCUTANEOUS
  Administered 2023-09-23 (×2): 2 [IU] via SUBCUTANEOUS
  Administered 2023-09-23: 3 [IU] via SUBCUTANEOUS
  Administered 2023-09-23 – 2023-09-25 (×4): 2 [IU] via SUBCUTANEOUS
  Filled 2023-09-21 (×11): qty 1

## 2023-09-21 MED ORDER — MIDAZOLAM HCL 2 MG/2ML IJ SOLN
INTRAMUSCULAR | Status: AC
  Administered 2023-09-21: 4 mg via INTRAVENOUS
  Filled 2023-09-21: qty 4

## 2023-09-21 MED ORDER — PIPERACILLIN-TAZOBACTAM 3.375 G IVPB
3.3750 g | Freq: Three times a day (TID) | INTRAVENOUS | Status: AC
  Administered 2023-09-21 – 2023-09-27 (×20): 3.375 g via INTRAVENOUS
  Filled 2023-09-21 (×20): qty 50

## 2023-09-21 MED ORDER — VASOPRESSIN 20 UNITS/100 ML INFUSION FOR SHOCK
0.0000 [IU]/min | INTRAVENOUS | Status: DC
  Administered 2023-09-21 – 2023-09-22 (×3): 0.03 [IU]/min via INTRAVENOUS
  Filled 2023-09-21 (×4): qty 100

## 2023-09-21 MED ORDER — FENTANYL 2500MCG IN NS 250ML (10MCG/ML) PREMIX INFUSION
25.0000 ug/h | INTRAVENOUS | Status: DC
Start: 2023-09-21 — End: 2023-09-22
  Administered 2023-09-22: 125 ug/h via INTRAVENOUS
  Filled 2023-09-21: qty 250

## 2023-09-21 MED ORDER — PANTOPRAZOLE SODIUM 40 MG IV SOLR
40.0000 mg | Freq: Two times a day (BID) | INTRAVENOUS | Status: DC
  Administered 2023-09-21 – 2023-10-06 (×31): 40 mg via INTRAVENOUS
  Filled 2023-09-21 (×31): qty 10

## 2023-09-21 MED ORDER — ROCURONIUM BROMIDE 10 MG/ML (PF) SYRINGE
PREFILLED_SYRINGE | INTRAVENOUS | Status: AC
  Administered 2023-09-21: 100 mg via INTRAVENOUS
  Filled 2023-09-21: qty 10

## 2023-09-21 MED ORDER — HEPARIN SODIUM (PORCINE) 5000 UNIT/ML IJ SOLN
5000.0000 [IU] | Freq: Three times a day (TID) | INTRAMUSCULAR | Status: DC
Start: 2023-09-21 — End: 2023-09-24
  Administered 2023-09-21 – 2023-09-24 (×9): 5000 [IU] via SUBCUTANEOUS
  Filled 2023-09-21 (×9): qty 1

## 2023-09-21 MED ORDER — ETOMIDATE 2 MG/ML IV SOLN: 20.0000 mg | Freq: Once | INTRAVENOUS | Status: AC

## 2023-09-21 MED ORDER — SODIUM CHLORIDE 0.9 % IV BOLUS
500.0000 mL | Freq: Once | INTRAVENOUS | Status: AC
  Administered 2023-09-21: 500 mL via INTRAVENOUS

## 2023-09-21 MED ORDER — LEVETIRACETAM IN NACL 1000 MG/100ML IV SOLN
1000.0000 mg | Freq: Two times a day (BID) | INTRAVENOUS | Status: DC
  Administered 2023-09-21 – 2023-10-06 (×32): 1000 mg via INTRAVENOUS
  Filled 2023-09-21 (×33): qty 100

## 2023-09-21 MED ORDER — FAMOTIDINE IN NACL 20-0.9 MG/50ML-% IV SOLN
20.0000 mg | Freq: Two times a day (BID) | INTRAVENOUS | Status: DC
  Filled 2023-09-21: qty 50

## 2023-09-21 MED ORDER — FAMOTIDINE 20 MG PO TABS: 20.0000 mg | ORAL_TABLET | Freq: Two times a day (BID) | ORAL | Status: DC

## 2023-09-21 MED ORDER — FENTANYL CITRATE (PF) 100 MCG/2ML IJ SOLN
INTRAMUSCULAR | Status: AC
  Administered 2023-09-21: 100 ug via INTRAVENOUS
  Filled 2023-09-21: qty 2

## 2023-09-21 MED ORDER — ACETAMINOPHEN 10 MG/ML IV SOLN
1000.0000 mg | Freq: Once | INTRAVENOUS | Status: AC
  Administered 2023-09-21: 1000 mg via INTRAVENOUS
  Filled 2023-09-21: qty 100

## 2023-09-21 MED ORDER — FENTANYL 2500MCG IN NS 250ML (10MCG/ML) PREMIX INFUSION
INTRAVENOUS | Status: AC
  Administered 2023-09-21: 25 ug/h via INTRAVENOUS
  Filled 2023-09-21: qty 250

## 2023-09-21 NOTE — Progress Notes (Addendum)
[facility] SURGICAL PROGRESS NOTE  Hospital Day(s): 18.   Post op day(s): 6 Days Post-Op.   Interval History:  Patient seen and examined Overnight, patient underwent aspiration event. He was transferred to the ICU and intubated.  Levophed 20 mcg/min Patient intubated; sedated Labs pending this AM Colostomy with gas and stool in bag; 400 ccs + unmeasured  NGT in place; had about 1400 ccs out   Vital signs in last 24 hours: [min-max] current  Temp:  [97.7 F (36.5 C)-98.6 F (37 C)] 98.5 F (36.9 C) (01/17 0441) Pulse Rate:  [93-142] 142 (01/17 0612) Resp:  [17-20] 20 (01/17 0441) BP: (73-124)/(55-83) 73/55 (01/17 0612) SpO2:  [93 %-99 %] 99 % (01/17 0719) FiO2 (%):  [40 %-60 %] 60 % (01/17 0719)     Height: 5\' 11"  (180.3 cm) Weight: 92.2 kg BMI (Calculated): 28.36   Intake/Output last 2 shifts:  01/16 0701 - 01/17 0700 In: 628.1 [I.V.:628.1] Out: 800 [Urine:400; Stool:400]   Physical Exam:  Constitutional: Intubated; sedated HEENT: NGT in place Respiratory: Intubated; on ventilator Cardiovascular: Tachycardic; 149 bpm Gastrointestinal: soft, non-distended 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, lots of gas and stool in bag.  Genitourinary: Catheter in place  Integumentary: Laparotomy is CDI with staples and now with retention sutures, serous drainage present on dressing; changed  Labs:     Latest Ref Rng & Units 09/20/2023    6:15 AM 09/19/2023    6:57 AM 09/18/2023    4:37 AM  CBC  WBC 4.0 - 10.5 K/uL 11.8  8.3  7.2   Hemoglobin 13.0 - 17.0 g/dL 9.6  9.2  16.1   Hematocrit 39.0 - 52.0 % 31.2  28.8  32.3   Platelets 150 - 400 K/uL 264  216  209       Latest Ref Rng & Units 09/20/2023    6:15 AM 09/19/2023    6:57 AM 09/18/2023    4:37 AM  CMP  Glucose 70 - 99 mg/dL 096  97  74   BUN 8 - 23 mg/dL 13  11  13    Creatinine 0.61 - 1.24 mg/dL 0.45  4.09  8.11   Sodium 135 - 145 mmol/L 138  137  136   Potassium 3.5 - 5.1 mmol/L 4.2  4.1  4.7   Chloride  98 - 111 mmol/L 102  101  103   CO2 22 - 32 mmol/L 25  25  24    Calcium 8.9 - 10.3 mg/dL 8.3  8.2  8.3     Imaging studies: No new pertinent imaging studies   Assessment/Plan: 73 y.o. male with aspiration overnight now intubated and need for vasopressor support 6 Days Post-Op closure of wound dehiscence with rentention sutures s/p initial exploratory laparotomy, LOA, and colostomy revision on 01/07   - Appreciate PCCM support  - Agree with NGT decompression; monitor and record output; LIS  - Would not utilize gut at this time; okay for TPN if need for nutrition  - Monitor abdominal examination  - If sedated/intubated, we can hold on abdominal binder as he is relaxed now  - Wound care: Cover with superficial gauze and ABD pad as needed. Change daily and as needed - anticipate heavy serous output from this, change as needed   - Appreciate WOC RN care with ostomy  - Retention sutures will need to be maintained for 4-6 weeks - Monitor colostomy output; record - Pain control prn; antiemetics prn - Further management per primary service; we will  follow   All of the above findings and recommendations were discussed with the medical team.   -- Lynden Oxford, PA-C [facility] 09/21/2023, 7:36 AM M-F: 7am - 4pm

## 2023-09-21 NOTE — Procedures (Signed)
Intubation Procedure Note  Calvin [name]  [id number]  10/24/1950  Date:09/21/23  Time:6:48 AM   Provider Performing:Lalanya Rufener L [place]    Procedure: Intubation (31500)  Indication(s) Respiratory Failure  Consent Unable to obtain consent due to emergent nature of procedure.   Anesthesia Etomidate, Versed, Fentanyl, and Rocuronium   Time Out Verified patient identification, verified procedure, site/side was marked, verified correct patient position, special equipment/implants available, medications/allergies/relevant history reviewed, required imaging and test results available.   Sterile Technique Usual hand hygeine, masks, and gloves were used   Procedure Description Patient positioned in bed supine.  Sedation given as noted above.  Patient was intubated with endotracheal tube using Glidescope.  View was Grade 1 full glottis .  Number of attempts was 1.  Colorimetric CO2 detector was consistent with tracheal placement.   Complications/Tolerance None; patient tolerated the procedure well. Chest X-ray is ordered to verify placement.   EBL Minimal   Specimen(s) None   Betsey Holiday, AGACNP-BC Acute Care Nurse Practitioner [facility]   [phone] / [phone] Please see Amion for details.

## 2023-09-21 NOTE — Consult Note (Signed)
NAME:  Calvin [name], MRN:  [id number], DOB:  June 05, 1951, LOS: 18 ADMISSION DATE:  09/03/2023, CONSULTATION DATE:  09/21/2023 REFERRING MD:  Manuela Schwartz, NP, CHIEF COMPLAINT:  Acute Respiratory Distress, Aspiration event   Brief Pt Description / Synopsis:  73 y.o. male admitted with PMHx most significant for colon cancer status post transverse colectomy and colostomy, presented with nausea, vomiting, and shortness of breath.  Was admitted with Sepsis due to Influenza A infection, Aspiration pneumonia, and Small Bowel Obstruction.  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  Course further complicated by Aspiration event requiring intubation and mechanical ventilation on 1/17.  History of Present Illness:  Calvin [name] is a 73 y.o. 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   Was admitted by [facility] for further workup and treatment of Was admitted with Sepsis due to Influenza A infection, Aspiration pneumonia, and Small Bowel Obstruction.  Please see "Significant Hospital Events" section below for full detailed hospital course.   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 side (HCC)    Hypertension    Myocardial infarction (HCC)    Neuromuscular disorder (HCC)     Ogilvie syndrome    Seizures (HCC)    last 6 months ago   Stroke [facility])    multiple  left side weakness   Tremors of nervous system    Unstable angina (HCC)      Micro Data:  12/30: SARS-CoV-2/RSV/Flu PCR>> + Influenza A 12/30: Blood cultures x2>>no growth 12/30: Urine>>Staphylococcus Aureus 12/30: MRSA PCR + 1/17: Tracheal aspirate>>  Antimicrobials:   Anti-infectives (From admission, onward)    Start     Dose/Rate Route Frequency Ordered Stop   09/21/23 0815  piperacillin-tazobactam (ZOSYN) IVPB 3.375 g        3.375 g 12.5 mL/hr over 240 Minutes Intravenous Every 8 hours 09/21/23 0729     09/11/23 2000  cefoTEtan (CEFOTAN) 2 g in sodium chloride 0.9 % 100 mL IVPB        2 g 200 mL/hr over 30 Minutes Intravenous Every 8 hours 09/11/23 1801 09/12/23 1910   09/11/23 0600  cefoTEtan (CEFOTAN) 2 g in sodium chloride 0.9 % 100 mL IVPB        2 g 200 mL/hr over 30 Minutes Intravenous On call to O.R. 09/10/23 0858 09/12/23 1910   09/04/23 1200  Ampicillin-Sulbactam (UNASYN) 3 g in sodium chloride 0.9 % 100 mL IVPB  Status:  Discontinued        3 g 200 mL/hr over 30 Minutes Intravenous Every 6 hours 09/04/23 1052 09/08/23 1537   09/03/23 1800  ceFEPIme (MAXIPIME) 2 g in sodium chloride 0.9 % 100 mL IVPB  Status:  Discontinued        2 g  200 mL/hr over 30 Minutes Intravenous Every 12 hours 09/03/23 0812 09/04/23 0903   09/03/23 1500  metroNIDAZOLE (FLAGYL) IVPB 500 mg  Status:  Discontinued        500 mg 100 mL/hr over 60 Minutes Intravenous Every 12 hours 09/03/23 0758 09/04/23 0903   09/03/23 1000  oseltamivir (TAMIFLU) capsule 30 mg        30 mg Oral 2 times daily 09/03/23 0757 09/08/23 0959   09/03/23 0315  ceFEPIme (MAXIPIME) 2 g in sodium chloride 0.9 % 100 mL IVPB        2 g 200 mL/hr over 30 Minutes Intravenous  Once 09/03/23 0304 09/03/23 0441   09/03/23 0315  metroNIDAZOLE (FLAGYL) IVPB 500 mg        500 mg 100 mL/hr over 60 Minutes Intravenous  Once 09/03/23 0304  09/03/23 0441   09/03/23 0315  vancomycin (VANCOCIN) IVPB 1000 mg/200 mL premix        1,000 mg 200 mL/hr over 60 Minutes Intravenous  Once 09/03/23 0304 09/03/23 0441       Significant Hospital Events: Including procedures, antibiotic start and stop dates in addition to other pertinent events   12/30: admitted to hospitalist sepsis, AKI, (+)Influenza A, likely aspiration pneumonia, acute hypoxic resp fail, SBO. NG placed, surgical consult.  12/31-01/06: relatively stable, remains SBO w/ NG and on TPN, worsening colostomy prolapse. Anemia w/o overt GIB, holding anti-plt/anti-cogs, GI no plans for endoscopic eval at this time 01/07: exlap, LOA, and colostomy revision  01/08: stable.  01/09: d/c NG 01/10: advancing diet as able. Hgb 6.8 - 1 unit PRBC given  01/11: persistent abd pain and cough, imaging concerning for abdominal surgical wound dehiscence deeper layers, going back to OR. Continue w/ cough suppression, await over-read but no obvious CT chest findings, suspect post-viral bronchitis  01/12: to OR early AM for closure of wound dehiscence. Postop stable, NG in place. CT chest question pneumonia but pt reports coughing improved, suspect was mostly reflux, will continue to monitor cough. Postop care per general surgery include clamp NG and remain NPO for now   01/13: NG out, CLD 01/14: FLD, continue advancing diet tolerated  1/15: Advanced to dysphagia diet after ST eval.  Discontinue telemetry, palliative care consult, DC central line 1/16: Underwent CT abdomen/pelvis last evening.  Not a candidate for another surgery due to poor functional and nutritional status and multiple recent procedures.  Family deciding on goals of care/hospice 1/17: Aspirated this morning with development of severe respiratory distress and hypoxia requiring emergent intubation.  Now with developing septic shock.  Pt's daughter requesting transfer to [facility] or [facility]  Dr. Aleen Campi spoke both facilities which are at  capacity and not accepting transfers.  Interim History / Subjective:  -As outlined in Significant Hospital Events section above  Objective   Blood pressure (!) 73/55, pulse (!) 142, temperature 98.5 F (36.9 C), temperature source Oral, resp. rate 20, height 5\' 11"  (1.803 m), weight 92.2 kg, SpO2 99%.    Vent Mode: PRVC FiO2 (%):  [40 %-60 %] 60 % Set Rate:  [18 bmp] 18 bmp Vt Set:  [500 mL] 500 mL PEEP:  [5 cmH20] 5 cmH20 Plateau Pressure:  [20 cmH20-24 cmH20] 20 cmH20   Intake/Output Summary (Last 24 hours) at 09/21/2023 0735 Last data filed at 09/21/2023 0437 Gross per 24 hour  Intake 628.11 ml  Output 800 ml  Net -171.89 ml   Filed Weights   09/18/23 2010 09/18/23 2100 09/20/23 0500  Weight: 94  kg 94 kg 92.2 kg    Examination: General: Critically ill-appearing frail elderly male, laying in bed, intubated and sedated, no acute distress HENT: Atraumatic, normocephalic, neck supple, orally intubated Lungs: Coarse breath sounds throughout, even, nonlabored, synchronous with the vent Cardiovascular: Tachycardia, regular rhythm, S1-S2, no murmurs, rubs, gallops Abdomen: Soft, nondistended, colostomy and mucous fistula in left upper quadrant, stool in bag Extremities: Normal bulk and tone, no deformities Neuro: Sedated, currently not following commands or withdrawing, pupils PERRLA and sluggish at 1 mm bilaterally GU: Foley catheter in place draining yellow urine  Resolved Hospital Problem list     Assessment & Plan:   #Acute Hypoxic & Hypercapnic Respiratory Failure #Large volume Aspiration #COPD without acute exacerbation #Influenza A Infection (present on admission) -Full vent support, implement lung protective strategies -Plateau pressures less than 30 cm H20 -Wean FiO2 & PEEP as tolerated to maintain O2 sats >92% -Follow intermittent Chest X-ray & ABG as needed -Spontaneous Breathing Trials when respiratory parameters met and mental status permits -Implement VAP  Bundle -Bronchodilators -ABX as above  #Shock: suspect Septic +/- Hypovolemic #Chronic HFpEF Echocardiogram 05/31/22: LVEF 50-55%, moderate LVH, indeterminate diastolic parameters, RV systolic function normal, mild AR -Continuous cardiac monitoring -Maintain MAP >65 -Gentle IV fluids -Vasopressors as needed to maintain MAP goal -Trend lactic acid until normalized -Trend HS Troponin until peaked -Consider repeat Echocardiogram  -Diuresis as BP and renal function permits ~ holding due to shock  #Aspiration Pneumonia #Influenza Infection (POA) ~ TREATED -Monitor fever curve -Trend WBC's & Procalcitonin -Follow cultures as above -Continue empiric Zosyn pending cultures & sensitivities  #Small Bowel Obstruction vs Ileus #Colostomy 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 -NPO; OGT to LIS -General Surgery following, appreciate input  #AKI on CKD Stage II  #BPH -Monitor I&O's / urinary output -Follow BMP -Ensure adequate renal perfusion -Avoid nephrotoxic agents as able -Replace electrolytes as indicated ~ Pharmacy following for assistance with electrolyte replacement -IV fluids  #Anemia -Monitor for S/Sx of bleeding -Trend CBC -Lovenox for VTE Prophylaxis  -Transfuse for Hgb <7  #Acute Metabolic Encephalopathy #Sedation needs in setting of mechanical ventilation -Maintain a RASS goal of 0 to -1 -Fentanyl and Versed as needed to maintain RASS goal -Avoid sedating medications as able -Daily wake up assessment     Patient is critically ill with shock and multiorgan failure.  Prognosis is guarded, high risk for further decompensation, cardiac arrest and death.  Given current critical illness superimposed on multiple chronic comorbidities including colon cancer, along with advanced age, overall long-term prognosis is poor.  Recommend consideration for DNR status.  Palliative care is following for ongoing goals of care discussions.  Best Practice  (right click and "Reselect all SmartList Selections" daily)   Diet/type: NPO DVT prophylaxis: LMWH GI prophylaxis: H2B and PPI Lines: Central line and yes and it is still needed Foley:  Yes, and it is still needed Code Status:  full code Last date of multidisciplinary goals of care discussion [1/17]  1/17: Pt's daughter updated via telephone on plan of care.  Labs   CBC: Recent Labs  Lab 09/16/23 0640 09/17/23 0525 09/18/23 0437 09/19/23 0657 09/20/23 0615  WBC 9.9 8.1 7.2 8.3 11.8*  HGB 9.0* 8.5* 10.0* 9.2* 9.6*  HCT 29.2* 28.0* 32.3* 28.8* 31.2*  MCV 93.6 94.0 93.6 91.1 93.7  PLT 173 159 209 216 264    Basic Metabolic Panel: Recent Labs  Lab 09/16/23 0756 09/17/23 0525 09/18/23 0437 09/19/23 0657 09/20/23 0615  NA 134* 140 136 137  138  K 6.0* 5.6* 4.7 4.1 4.2  CL 104 106 103 101 102  CO2 24 24 24 25 25   GLUCOSE 129* 78 74 97 105*  BUN 21 20 13 11 13   CREATININE 0.92 0.94 0.99 1.03 1.16  CALCIUM 8.2* 8.3* 8.3* 8.2* 8.3*   GFR: Estimated Creatinine Clearance: 64.9 mL/min (by C-G formula based on SCr of 1.16 mg/dL). Recent Labs  Lab 09/17/23 0525 09/18/23 0437 09/19/23 0657 09/20/23 0615  WBC 8.1 7.2 8.3 11.8*    Liver Function Tests: No results for input(s): "AST", "ALT", "ALKPHOS", "BILITOT", "PROT", "ALBUMIN" in the last 168 hours. No results for input(s): "LIPASE", "AMYLASE" in the last 168 hours. No results for input(s): "AMMONIA" in the last 168 hours.  ABG    Component Value Date/Time   PHART 7.33 (L) 09/03/2023 1645   PCO2ART 41 09/03/2023 1645   PO2ART 99 09/03/2023 1645   HCO3 21.6 09/03/2023 1645   ACIDBASEDEF 4.1 (H) 09/03/2023 1645   O2SAT 98.7 09/03/2023 1645     Coagulation Profile: No results for input(s): "INR", "PROTIME" in the last 168 hours.  Cardiac Enzymes: No results for input(s): "CKTOTAL", "CKMB", "CKMBINDEX", "TROPONINI" in the last 168 hours.  HbA1C: Hgb A1c MFr Bld  Date/Time Value Ref Range Status  05/28/2022  06:17 PM 5.8 (H) 4.8 - 5.6 % Final    Comment:    (NOTE) Pre diabetes:          5.7%-6.4%  Diabetes:              >6.4%  Glycemic control for   <7.0% adults with diabetes   08/09/2019 07:31 AM 5.7 (H) 4.8 - 5.6 % Final    Comment:    (NOTE) Pre diabetes:          5.7%-6.4% Diabetes:              >6.4% Glycemic control for   <7.0% adults with diabetes     CBG: Recent Labs  Lab 09/20/23 0758 09/20/23 1206 09/20/23 1644 09/20/23 2325 09/21/23 0607  GLUCAP 110* 100* 90 82 107*    Review of Systems:   Unable to assess due to Intubation/sedation/critical illness   Past Medical History:  He,  has a past medical history of Alcohol abuse, Anemia, Anxiety, Arthritis, Cancer (HCC), COPD (chronic obstructive pulmonary disease) (HCC), Coronary artery disease, Depression, Gout, Hemiplegia and 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 side (HCC), Hypertension, Myocardial infarction (HCC), Neuromuscular disorder (HCC), Ogilvie syndrome, Seizures (HCC), Stroke (HCC), Tremors of nervous system, and Unstable angina (HCC).   Surgical History:   Past Surgical History:  Procedure Laterality Date   CARPAL TUNNEL RELEASE Left 10/19/2015   Procedure: CARPAL TUNNEL RELEASE;  Surgeon: Kennedy Bucker, MD;  Location: [facility];  Service: Orthopedics;  Laterality: Left;   COLONOSCOPY WITH PROPOFOL N/A 10/26/2021   Procedure: COLONOSCOPY WITH PROPOFOL;  Surgeon: Regis Bill, MD;  Location: [facility];  Service: Endoscopy;  Laterality: N/A;   COLONOSCOPY WITH PROPOFOL N/A 06/02/2022   Procedure: COLONOSCOPY WITH PROPOFOL;  Surgeon: Regis Bill, MD;  Location: [facility];  Service: Endoscopy;  Laterality: N/A;   COLONOSCOPY WITH PROPOFOL N/A 06/06/2022   Procedure: COLONOSCOPY WITH PROPOFOL;  Surgeon: Regis Bill, MD;  Location: [facility];  Service: Endoscopy;  Laterality: N/A;   COLOSTOMY REVISION N/A 09/11/2023   Procedure: COLOSTOMY  REVISION;  Surgeon: Henrene Dodge, MD;  Location: [facility];  Service: General;  Laterality: N/A;   JOINT REPLACEMENT  left partial hip    LAPAROSCOPIC SIGMOID COLECTOMY  09/12/2010   Lap hand assisted sigmoidectomy, mobilization splenic flexure -- Dr. Freida Busman   LAPAROTOMY N/A 09/11/2023   Procedure: EXPLORATORY LAPAROTOMY;  Surgeon: Henrene Dodge, MD;  Location: [facility];  Service: General;  Laterality: N/A;   LAPAROTOMY N/A 09/15/2023   Procedure: EXPLORATORY LAPAROTOMY;  Surgeon: Sung Amabile, DO;  Location: [facility];  Service: General;  Laterality: N/A;   LYSIS OF ADHESION N/A 09/11/2023   Procedure: LYSIS OF ADHESION;  Surgeon: Henrene Dodge, MD;  Location: [facility];  Service: General;  Laterality: N/A;   THROAT SURGERY  09/05/2011   cancer     Social History:   reports that he has quit smoking. His smoking use included cigarettes. He has never used smokeless tobacco. He reports that he does not currently use alcohol. He reports that he does not use drugs.   Family History:  His family history includes Cancer in his mother; Hypertension in his father; Leukemia in his brother.   Allergies No Known Allergies   Home Medications  Prior to Admission medications   Medication Sig Start Date End Date Taking? Authorizing Provider  aspirin 81 MG chewable tablet Take 81 mg by mouth daily.   Yes [provider]  baclofen (LIORESAL) 5 mg TABS tablet Take 5 mg by mouth 2 (two) times daily.   Yes [provider]  diazePAM, 20 MG Dose, (VALTOCO 20 MG DOSE) 2 x 10 MG/0.1ML LQPK Place 20 mg into the nose as needed (seizure lasting over 2 minutes). 08/23/23  Yes Leroy Sea, MD  ipratropium-albuterol (DUONEB) 0.5-2.5 (3) MG/3ML SOLN Inhale 3 mLs into the lungs every 6 (six) hours as needed. 09/02/23  Yes [provider]  lactulose (CHRONULAC) 10 GM/15ML solution Take 20 g by mouth daily. 08/15/23  Yes [provider]  lamoTRIgine (LAMICTAL) 25 MG tablet Take 50  mg by mouth 2 (two) times daily.   Yes [provider]  levETIRAcetam (KEPPRA) 1000 MG tablet Take 1 tablet (1,000 mg total) by mouth 2 (two) times daily. 08/23/23  Yes Leroy Sea, MD  Melatonin 10 MG TABS Take 10 mg by mouth at bedtime.   Yes [provider]  nitroGLYCERIN (NITROSTAT) 0.4 MG SL tablet Place 0.4 mg under the tongue 3 (three) times daily as needed for chest pain. Every 5 minutes up to 3 doses 08/24/23  Yes [provider]  pantoprazole (PROTONIX) 40 MG tablet Take 1 tablet (40 mg total) by mouth daily. 08/23/23  Yes Leroy Sea, MD  polyethylene glycol (MIRALAX / GLYCOLAX) 17 g packet Take 17 g by mouth 2 (two) times daily. 08/19/23  Yes Wieting, Richard, MD  pregabalin (LYRICA) 100 MG capsule Take 200 mg by mouth at bedtime.   Yes [provider]  senna (SENOKOT) 8.6 MG TABS tablet Take 2 tablets (17.2 mg total) by mouth at bedtime as needed for mild constipation or moderate constipation. 06/26/23  Yes Sunnie Nielsen, DO  tamsulosin (FLOMAX) 0.4 MG CAPS capsule Take 0.4 mg by mouth every evening.   Yes [provider]  divalproex (DEPAKOTE) 125 MG DR tablet Take 4 tablets (500 mg total) by mouth 2 (two) times daily for 1 day, THEN 4 tablets (500 mg total) daily for 3 days, THEN 2 tablets (250 mg total) daily for 3 days, THEN 1 tablet (125 mg total) daily for 3 days. Patient not taking: Reported on 09/03/2023 08/23/23 09/03/23  Leroy Sea, MD  fluticasone Aleda Grana)  50 MCG/ACT nasal spray Place 2 sprays into both nostrils daily. Patient not taking: Reported on 09/03/2023 05/15/23   [provider]  Mouthwashes (MOUTH RINSE) LIQD solution 15 mLs by Mouth Rinse route every 2 (two) hours. Patient not taking: Reported on 09/03/2023 08/19/23   Alford Highland, MD  pregabalin (LYRICA) 50 MG capsule Take 50 mg capsule twice daily and 100 mg (two capsules) at bedtime Patient not taking: Reported on 09/03/2023  08/23/23   Leroy Sea, MD  tiotropium (SPIRIVA) 18 MCG inhalation capsule Place 18 mcg into inhaler and inhale daily. Patient not taking: Reported on 09/03/2023    [provider]     Critical care time: 60 minutes     Harlon Ditty, AGACNP-BC [facility] Prefer epic messenger for cross cover needs If after hours, please call E-link

## 2023-09-21 NOTE — Plan of Care (Signed)
  Problem: Education: Goal: Knowledge of General Education information will improve Description: Including pain rating scale, medication(s)/side effects and non-pharmacologic comfort measures Outcome: Progressing   Problem: Clinical Measurements: Goal: Ability to maintain clinical measurements within normal limits will improve Outcome: Progressing   Problem: Activity: Goal: Risk for activity intolerance will decrease Outcome: Progressing   Problem: Nutrition: Goal: Adequate nutrition will be maintained Outcome: Progressing   Problem: Coping: Goal: Level of anxiety will decrease Outcome: Progressing   Problem: Pain Management: Goal: General experience of comfort will improve Outcome: Progressing   Problem: Safety: Goal: Ability to remain free from injury will improve Outcome: Progressing   Problem: Skin Integrity: Goal: Risk for impaired skin integrity will decrease Outcome: Progressing   Problem: Education: Goal: Knowledge of disease or condition will improve Outcome: Progressing   Problem: Activity: Goal: Ability to tolerate increased activity will improve Outcome: Progressing   Problem: Respiratory: Goal: Ability to maintain a clear airway will improve Outcome: Progressing

## 2023-09-21 NOTE — Progress Notes (Addendum)
Nutrition Follow-up  DOCUMENTATION CODES:   Not applicable  INTERVENTION:   -RD will continue to follow for goals of care; may need to consider TPN per general surgery if plan to remain intubated >48 hours and unable to use GI tract -RD will follow for ability to transition to enteral feeds. Recommend:  Initiate Vital 1.5 @ 20 ml/hr and increase by 10 ml every 4 hours to goal rate of 60 ml/hr.   60 ml Prosource TF daily  30 ml free water flush every 4 hours   Tube feeding regimen provides 2240 kcal (100% of needs), 117 grams of protein, and 1100 ml of H2O. Total free water: 1280 ml daily  -1 packet Juven BID via tube, each packet provides 95 calories, 2.5 grams of protein (collagen), and 9.8 grams of carbohydrate (3 grams sugar); also contains 7 grams of L-arginine and L-glutamine, 300 mg vitamin C, 15 mg vitamin E, 1.2 mcg vitamin B-12, 9.5 mg zinc, 200 mg calcium, and 1.5 g  Calcium Beta-hydroxy-Beta-methylbutyrate to support wound healing   NUTRITION DIAGNOSIS:   Inadequate oral intake related to altered GI function as evidenced by NPO status.  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  12/31- s/p BSE- plan for dysphagia 1 diet with nectar thick liquids when cleared for diet per SLP 1/1- NGT placed (per KUB on 09/05/23 revealed tip of tube in stomach)  1/3- PICC placed, TPN 1/4- NGT fell out and replaced 1/6- NGT pulled again 1/7- s/p Exploratory Laparotomy, lysis of adhesions,  revision of loop colostomy into end colostomy with mucous fistula 1/9- NGT d/c, advanced to clear liquid diet 1/10- advanced to full liquids 1/11- TPN d/c 1/12- s/p closure of wound dehiscence  1/13- advanced to clear liquid diet, NGT d/c 1/14- advanced to full liquid diet 1/15- s/p BSE- advanced to dysphagia 1 diet with nectar thick liquids; CT scan revealed fascial dehiscence in 1-2 areas of midline incision  1/17- transferred to ICU due to aspiration event, emergently intubated  Patient is currently intubated on ventilator support. OGT placed; currently connected to low, intermittent suction. Per KUB today, tip of tube in stomach.  MV: 8.3 L/min Temp (24hrs), Avg:98.9 F (37.2 C), Min:97.7 F (36.5 C), Max:100.5 F (38.1 C)  Reviewed I/O's: -1 L x 24 hours and -13.6 L since 09/07/23  UOP: 400 ml x 24 hours  OGT output: 1.5 L x 24 hours  Colostomy output: 400 ml x 24 hours   MAP: 74  Generally surgery currently recommending holding off on further surgeries to midline secondary to instability. Also recommending continued OGT to suction and NPO. TPN may be considered pending goals of care.   Palliative care following for goals of care discussions. Noted pt family desires to transfer care to [facility].   Medications reviewed and include pulmicort, colace, heparin, lactulose, melatonin, protonix, miralax, fentanyl, keppra, levophed, and vasopressin.   Reviewed wt hx; pt has experienced a 4.5% wt loss over the past week, which is significant for time frame. Suspect some wt loss may be related  to fluid losses (-13.6 L since 09/07/23). RD will continue to monitor weight trends.   Labs reviewed: K, Mg, and Phos WDL. CBGS: 107-145 (inpatient orders for glycemic control are 0-15 units insulin aspart every 4 hours).    Diet Order:   Diet Order             Diet NPO time specified  Diet effective now                   EDUCATION NEEDS:   Not appropriate for education at this  time  Skin:  Skin Assessment: Skin Integrity Issues: Skin Integrity Issues:: Incisions Unstageable: rt and lt heels Incisions: closed abdomen  Last BM:  09/21/23 (type 7- via colosotmy)  Height:   Ht Readings from Last 1 Encounters:  09/04/23 5\' 11"  (1.803 m)    Weight:   Wt Readings from Last 1 Encounters:  09/20/23 92.2 kg    Ideal Body Weight:  78.1 kg  BMI:  Body mass index is 28.35 kg/m.  Estimated Nutritional Needs:   Kcal:  2150-2350  Protein:  105-120 grams  Fluid:  2-2.2 L    Levada Schilling, RD, LDN, CDCES Registered Dietitian III Certified Diabetes Care and Education Specialist If unable to reach this RD, please use "RD Inpatient" group chat on secure chat between hours of 8am-4 pm daily

## 2023-09-21 NOTE — Progress Notes (Signed)
Peripherally Inserted Central Catheter Exchange  The IV Nurse has discussed with the patient and/or persons authorized to consent for the patient, the purpose of this procedure and the potential benefits and risks involved with this procedure.  The benefits include less needle sticks, lab draws from the catheter, and the patient may be discharged home with the catheter. Risks include, but not limited to, infection, bleeding, blood clot (thrombus formation), and puncture of an artery; nerve damage and irregular heartbeat and possibility to perform a PICC exchange if needed/ordered by physician.  Alternatives to this procedure were also discussed.  Bard Power PICC patient education guide, fact sheet on infection prevention and patient information card has been provided to patient /or left at bedside.    PICC Placement Documentation  PICC Triple Lumen Right Basilic 40 cm 0 cm (Active)  Indication for Insertion or Continuance of Line Limited venous access - need for IV therapy >5 days (PICC only) 09/21/23 1800  Exposed Catheter (cm) 0 cm 09/21/23 1800  Site Assessment Clean, Dry, Intact 09/21/23 1800  Lumen #1 Status Flushed;Saline locked;Blood return noted 09/21/23 1800  Lumen #2 Status Flushed;Saline locked;Blood return noted 09/21/23 1800  Lumen #3 Status Flushed;Saline locked;Blood return noted 09/21/23 1800  Dressing Type Transparent;Securing device 09/21/23 1800  Dressing Status Antimicrobial disc/dressing in place 09/21/23 1800  Line Care Connections checked and tightened 09/21/23 1800  Line Adjustment (NICU/IV Team Only) No 09/21/23 1800  Dressing Intervention New dressing 09/21/23 1800  Dressing Change Due 09/28/23 09/21/23 1800       Vernona Rieger  Dawayne Ohair 09/21/2023, 6:37 PM

## 2023-09-21 NOTE — Plan of Care (Signed)
  Problem: Clinical Measurements: Goal: Respiratory complications will improve Outcome: Progressing Goal: Cardiovascular complication will be avoided Outcome: Progressing   Problem: Coping: Goal: Level of anxiety will decrease Outcome: Progressing   Problem: Pain Management: Goal: General experience of comfort will improve Outcome: Progressing   Problem: Education: Goal: Knowledge of General Education information will improve Description: Including pain rating scale, medication(s)/side effects and non-pharmacologic comfort measures Outcome: Not Progressing   Problem: Health Behavior/Discharge Planning: Goal: Ability to manage health-related needs will improve Outcome: Not Progressing   Problem: Clinical Measurements: Goal: Ability to maintain clinical measurements within normal limits will improve Outcome: Not Progressing Goal: Will remain free from infection Outcome: Not Progressing   Problem: Nutrition: Goal: Adequate nutrition will be maintained Outcome: Not Progressing

## 2023-09-21 NOTE — Progress Notes (Signed)
       CROSS COVER NOTE  NAME: Calvin [name] MRN: [id number] DOB : 1950/12/23 ATTENDING PHYSICIAN: Janann Colonel, MD    Date of Service   09/21/2023   HPI/Events of Note   Rapid called for frank aspiration event Patient with decreased mentation and now requiring non rebreather sats   Interventions   Assessment/Plan: 500 ns for hypotension ordered Transferred to ICU. ICU service assumed care with intubation Brother informed via phone Dr Aleen Campi and Dr Sherryll Burger informed     Donnie Mesa NP [facility] Cross Cover 7pm-7am - check amion for availability Pager [phone]

## 2023-09-21 NOTE — Progress Notes (Signed)
Lab personnel came out patient room stating that patient seem to have aspirated, had yellow substance on the mouth and sounded gargling. I was just in the patient room about 15 minutes before this and patient had finally settled down to sleep. Charge nurse and other nurses was in the room with the patient and had attached non rebreather with low bp and sat of 80%. Patient responding with poor cough reflex. Was suctioned and 50 ml of yellow bile looking drainage was removed from the back of his throat. Rapid called and team came. See pt v/s on flow sheet. 500 Nacl bolus ordered same started. Patient transferred to ICU on bed by myself and ICU nurse. Provider indicated that she attempted to call patient brother but no success. I informed both chare nurse of this.

## 2023-09-21 NOTE — Progress Notes (Addendum)
Daily Progress Note   [name]       Date: 09/21/2023 DOB: 06/25/1951  Age: 73 y.o. MRN#: [id number] Attending Physician: Janann Colonel, MD Primary Care Physician: Keane Police, MD Admit Date: 09/03/2023  Reason for Consultation/Follow-up: Establishing goals of care  Subjective: Notes and labs reviewed, including notes on rapid response for frank aspiration, and transfer to ICU with subsequent intubation.  In to see patient.  Patient is currently resting in bed on ventilator support.  No family at bedside.  While in ICU, called to speak with daughter Calvin [name].  Calvin [name] advises that she is unaware of patient's status change.  Discussed as per notes, her brother was called and updated.  She states she has not received a call from her brother.  Discussed events and updates.  CCM NP present as well, and spoke with her.  NP hung up at the completion of their conversation.  He advises that daughter is wanting patient moved to [facility] for further management.  Length of Stay: 18  Current Medications: Scheduled Meds:   acetaminophen  650 mg Oral Q6H   baclofen  5 mg Oral BID   benzonatate  200 mg Oral TID   budesonide (PULMICORT) nebulizer solution  0.25 mg Nebulization BID   Chlorhexidine Gluconate Cloth  6 each Topical Daily   docusate  100 mg Per Tube BID   enoxaparin (LOVENOX) injection  40 mg Subcutaneous Q24H   feeding supplement (NEPRO CARB STEADY)  237 mL Oral TID BM   insulin aspart  0-15 Units Subcutaneous Q4H   ipratropium-albuterol  3 mL Inhalation BID   lactulose  20 g Oral Daily   lamoTRIgine  50 mg Oral BID   melatonin  10 mg Oral QHS   mometasone-formoterol  2 puff Inhalation BID   multivitamin with minerals  1 tablet Oral Daily   mouth  rinse  15 mL Mouth Rinse Q2H   pantoprazole (PROTONIX) IV  40 mg Intravenous Q12H   polyethylene glycol  17 g Oral BID   polyethylene glycol  17 g Per Tube Daily   pregabalin  100 mg Oral Daily   pregabalin  200 mg Oral QHS   sodium chloride flush  10-40 mL Intracatheter Q12H   sodium chloride flush  3-10 mL Intravenous Q12H   tamsulosin  0.4  mg Oral QPM    Continuous Infusions:  sodium chloride Stopped (09/21/23 0628)   fentaNYL infusion INTRAVENOUS 50 mcg/hr (09/21/23 1000)   levETIRAcetam 1,000 mg (09/21/23 1057)   norepinephrine (LEVOPHED) Adult infusion 16 mcg/min (09/21/23 1001)   piperacillin-tazobactam (ZOSYN)  IV 3.375 g (09/21/23 1039)   vasopressin 0.03 Units/min (09/21/23 1000)    PRN Meds: fentaNYL, HYDROcodone bit-homatropine, HYDROmorphone (DILAUDID) injection, metoCLOPramide (REGLAN) injection, midazolam, mouth rinse, oxyCODONE, senna, sodium chloride flush, sodium chloride flush  Physical Exam Constitutional:      Comments: Eyes closed.  Pulmonary:     Comments: On ventilator support.            Vital Signs: BP 103/63   Pulse (!) 109   Temp (!) 100.5 F (38.1 C) (Oral)   Resp (!) 21   Ht 5\' 11"  (1.803 m)   Wt 92.2 kg   SpO2 94%   BMI 28.35 kg/m  SpO2: SpO2: 94 % O2 Device: O2 Device: Ventilator O2 Flow Rate: O2 Flow Rate (L/min): 2 L/min  Intake/output summary:  Intake/Output Summary (Last 24 hours) at 09/21/2023 1141 Last data filed at 09/21/2023 1000 Gross per 24 hour  Intake 2366.92 ml  Output 2328 ml  Net 38.92 ml   LBM: Last BM Date : 09/21/23 Baseline Weight: Weight: 97.8 kg Most recent weight: Weight: 92.2 kg   Patient Active Problem List   Diagnosis Date Noted   Aspiration into airway 09/21/2023   Small bowel obstruction due to adhesions (HCC) 09/11/2023   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   Cancer of sigmoid (HCC) 09/04/2023   Gout 09/04/2023   Acute respiratory failure with hypoxia (HCC) 09/03/2023   Acute metabolic  encephalopathy 08/19/2023   History of CVA (cerebrovascular accident) 08/19/2023   Thrombocytopenia (HCC) 08/19/2023   Hypotension 07/15/2023   Small bowel obstruction (HCC) 07/15/2023   Hemiplegia and 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 side (HCC)    Temporal pain 06/18/2022   Acute kidney injury superimposed on CKD (HCC) 06/17/2022   Dysphagia 06/07/2022   Respiratory distress 06/02/2022   Neuropathy 06/02/2022   Paroxysmal atrial flutter (HCC) 06/01/2022   Aspiration pneumonia (HCC) 06/01/2022   Cervical spinal stenosis 05/31/2022   Chronic diastolic CHF (congestive heart failure) (HCC) 05/28/2022   SBO, recurrent (small bowel obstruction) (HCC)    Abdominal distention    HLD (hyperlipidemia) 10/16/2021   Nausea vomiting and diarrhea 10/16/2021   Sepsis (HCC) 10/16/2021   Tobacco abuse 10/16/2021   Muscle twitching 08/14/2019   UTI (urinary tract infection) 08/03/2019   Ileus (HCC) 08/03/2019   Hypokalemia 08/03/2019   QT prolongation 08/03/2019   Ogilvie syndrome    Acute abdominal pain 04/03/2019   Left-sided weakness 09/30/2017   Seizure (HCC) 09/30/2017   Essential hypertension 09/30/2017   CAD (coronary artery disease) 09/30/2017   COPD (chronic obstructive pulmonary disease) (HCC) 09/30/2017   Depression with anxiety 09/30/2017    Palliative Care Assessment & Plan    Recommendations/Plan: Daughter updated by myself and CCM NP.  Per conversation with CCM NP at the completion of the call, daughter is requesting patient to be transferred to [facility].  Code Status:    Code Status Orders  (From admission, onward)           Start     Ordered   09/03/23 0754  Full code  Continuous       Question:  By:  Answer:  Consent: discussion documented in EHR   09/03/23 0755  Code Status History     Date Active Date Inactive Code Status Order ID Comments User Context   08/19/2023 1314 08/23/2023 1838 Full Code [id number]   Alan Mulder, MD Inpatient   08/18/2023 1448 08/19/2023 1154 Full Code [id number]  Emeline General, MD ED   07/18/2023 1748 07/24/2023 1712 Full Code [id number]  Kathrynn Running, MD Inpatient   07/15/2023 0538 07/18/2023 1551 Full Code [id number]  Andris Baumann, MD ED   06/22/2023 0939 06/27/2023 0108 Full Code [id number]  Emeline General, MD ED   05/28/2022 1622 06/30/2022 2326 Full Code [id number]  Lorretta Harp, MD ED   10/23/2021 2158 11/02/2021 2029 Full Code [id number]  Lajoyce Corners, NP Inpatient   10/16/2021 1144 10/23/2021 2158 DNR [id number]  Lorretta Harp, MD ED   10/02/2021 0731 10/02/2021 1641 DNR [id number]  Loleta Rose, MD ED   08/03/2019 2226 08/14/2019 2350 Full Code [id number]  Lurene Shadow, MD Inpatient   04/03/2019 1711 04/08/2019 1731 Full Code [id number]  Auburn Bilberry, MD Inpatient   04/03/2019 1627 04/03/2019 1710 DNR [id number]  Auburn Bilberry, MD ED   09/30/2017 2301 10/02/2017 2039 Full Code [id number]  Oralia Manis, MD Inpatient       Prognosis: Very poor   Care plan was discussed with CCM  Thank you for allowing the Palliative Medicine Team to assist in the care of this patient.  Morton Stall, NP  Please contact Palliative Medicine Team phone at [phone] for questions and concerns.

## 2023-09-21 NOTE — Significant Event (Signed)
Rapid Response Event Note   Reason for Call :  Aspiration  Initial Focused Assessment:  Patient sort of breath rhonchi sound heard from door. Primary nurse expressed patient had just vomited Patient O2 82 on 3L. Patient increased to 15L NRB  O2 up to 94. Patients eyes open but not following commands BP 73/55 HR 140s. Manuela Schwartz NP at bedside. 500 ml Bolus order and patient emergently transported to ICU. Levo started and patient emergently intubated in ICU.        MD Notified: [phone] Call Time:0605 Arrival Time:0610 End GNFA:2130  Judyann Munson, RN

## 2023-09-22 ENCOUNTER — Inpatient Hospital Stay: Payer: Medicare Other

## 2023-09-22 ENCOUNTER — Inpatient Hospital Stay (HOSPITAL_COMMUNITY)
Admit: 2023-09-22 | Discharge: 2023-09-22 | Disposition: A | Discharge status: Home / Self Care | Payer: Medicare Other | Attending: Critical Care Medicine | Admitting: Critical Care Medicine

## 2023-09-22 DIAGNOSIS — J9601 Acute respiratory failure with hypoxia: Secondary | ICD-10-CM | POA: Diagnosis not present

## 2023-09-22 DIAGNOSIS — R578 Other shock: Secondary | ICD-10-CM

## 2023-09-22 LAB — ECHOCARDIOGRAM COMPLETE
AR max vel: 2.91 cm2
AV Peak grad: 3.8 mm[Hg]
Ao pk vel: 0.98 m/s
Area-P 1/2: 3.99 cm2
Calc EF: 57.3 %
Height: 71 in
S' Lateral: 3.5 cm
Single Plane A2C EF: 61.7 %
Single Plane A4C EF: 51.1 %
Weight: 3252.23 [oz_av]

## 2023-09-22 LAB — BLOOD GAS, ARTERIAL
Acid-base deficit: 0.8 mmol/L (ref 0.0–2.0)
Bicarbonate: 24.9 mmol/L (ref 20.0–28.0)
Delivery systems: POSITIVE
Expiratory PAP: 6 cm[H2O]
FIO2: 40 %
Inspiratory PAP: 16 cm[H2O]
O2 Saturation: 96.6 %
Patient temperature: 37
RATE: 10 {breaths}/min
pCO2 arterial: 44 mm[Hg] (ref 32–48)
pH, Arterial: 7.36 (ref 7.35–7.45)
pO2, Arterial: 70 mm[Hg] — ABNORMAL LOW (ref 83–108)

## 2023-09-22 LAB — RENAL FUNCTION PANEL
Albumin: 2.1 g/dL — ABNORMAL LOW (ref 3.5–5.0)
Anion gap: 10 (ref 5–15)
BUN: 17 mg/dL (ref 8–23)
CO2: 24 mmol/L (ref 22–32)
Calcium: 7.5 mg/dL — ABNORMAL LOW (ref 8.9–10.3)
Chloride: 104 mmol/L (ref 98–111)
Creatinine, Ser: 1.35 mg/dL — ABNORMAL HIGH (ref 0.61–1.24)
GFR, Estimated: 55 mL/min — ABNORMAL LOW (ref >60)
Glucose, Bld: 132 mg/dL — ABNORMAL HIGH (ref 70–99)
Phosphorus: 4.4 mg/dL (ref 2.5–4.6)
Potassium: 4.8 mmol/L (ref 3.5–5.1)
Sodium: 138 mmol/L (ref 135–145)

## 2023-09-22 LAB — CBC
HCT: 30.1 % — ABNORMAL LOW (ref 39.0–52.0)
Hemoglobin: 8.9 g/dL — ABNORMAL LOW (ref 13.0–17.0)
MCH: 28.8 pg (ref 26.0–34.0)
MCHC: 29.6 g/dL — ABNORMAL LOW (ref 30.0–36.0)
MCV: 97.4 fL (ref 80.0–100.0)
Platelets: 249 10*3/uL (ref 150–400)
RBC: 3.09 MIL/uL — ABNORMAL LOW (ref 4.22–5.81)
RDW: 14.6 % (ref 11.5–15.5)
WBC: 21.8 10*3/uL — ABNORMAL HIGH (ref 4.0–10.5)
nRBC: 0 % (ref 0.0–0.2)

## 2023-09-22 LAB — GLUCOSE, CAPILLARY
Glucose-Capillary: 108 mg/dL — ABNORMAL HIGH (ref 70–99)
Glucose-Capillary: 112 mg/dL — ABNORMAL HIGH (ref 70–99)
Glucose-Capillary: 113 mg/dL — ABNORMAL HIGH (ref 70–99)
Glucose-Capillary: 117 mg/dL — ABNORMAL HIGH (ref 70–99)
Glucose-Capillary: 120 mg/dL — ABNORMAL HIGH (ref 70–99)
Glucose-Capillary: 182 mg/dL — ABNORMAL HIGH (ref 70–99)

## 2023-09-22 LAB — MRSA NEXT GEN BY PCR, NASAL: MRSA by PCR Next Gen: DETECTED — AB

## 2023-09-22 LAB — MAGNESIUM: Magnesium: 1.6 mg/dL — ABNORMAL LOW (ref 1.7–2.4)

## 2023-09-22 LAB — LACTIC ACID, PLASMA: Lactic Acid, Venous: 1.1 mmol/L (ref 0.5–1.9)

## 2023-09-22 LAB — TROPONIN I (HIGH SENSITIVITY): Troponin I (High Sensitivity): 22 ng/L — ABNORMAL HIGH (ref <18)

## 2023-09-22 MED ORDER — TRACE MINERALS CU-MN-SE-ZN 300-55-60-3000 MCG/ML IV SOLN: INTRAVENOUS | Status: DC

## 2023-09-22 MED ORDER — SODIUM CHLORIDE 3 % IN NEBU
4.0000 mL | INHALATION_SOLUTION | Freq: Every day | RESPIRATORY_TRACT | Status: AC
  Administered 2023-09-22 – 2023-09-24 (×3): 4 mL via RESPIRATORY_TRACT
  Filled 2023-09-22 (×3): qty 4

## 2023-09-22 MED ORDER — IPRATROPIUM-ALBUTEROL 0.5-2.5 (3) MG/3ML IN SOLN: 3.0000 mL | Freq: Four times a day (QID) | RESPIRATORY_TRACT | Status: DC | PRN

## 2023-09-22 MED ORDER — MIDAZOLAM HCL 2 MG/2ML IJ SOLN
2.0000 mg | Freq: Once | INTRAMUSCULAR | Status: AC
Start: 2023-09-22 — End: 2023-09-22
  Administered 2023-09-22: 2 mg via INTRAVENOUS
  Filled 2023-09-22: qty 2

## 2023-09-22 MED ORDER — CHLORHEXIDINE GLUCONATE CLOTH 2 % EX PADS
6.0000 | MEDICATED_PAD | Freq: Every day | CUTANEOUS | Status: DC
  Administered 2023-09-23 – 2023-10-06 (×14): 6 via TOPICAL

## 2023-09-22 MED ORDER — VANCOMYCIN HCL IN DEXTROSE 1-5 GM/200ML-% IV SOLN
1000.0000 mg | INTRAVENOUS | Status: DC
  Administered 2023-09-22: 1000 mg via INTRAVENOUS
  Filled 2023-09-22: qty 200

## 2023-09-22 MED ORDER — ORAL CARE MOUTH RINSE
15.0000 mL | OROMUCOSAL | Status: DC
  Administered 2023-09-22 – 2023-09-24 (×6): 15 mL via OROMUCOSAL

## 2023-09-22 MED ORDER — MAGNESIUM SULFATE 2 GM/50ML IV SOLN
2.0000 g | Freq: Once | INTRAVENOUS | Status: AC
  Administered 2023-09-22: 2 g via INTRAVENOUS
  Filled 2023-09-22: qty 50

## 2023-09-22 MED ORDER — CLINIMIX E/DEXTROSE (8/10) 8 % IV SOLN
INTRAVENOUS | Status: AC
  Filled 2023-09-22: qty 2000

## 2023-09-22 MED ORDER — PERFLUTREN LIPID MICROSPHERE
1.0000 mL | INTRAVENOUS | Status: AC | PRN
  Administered 2023-09-22: 5 mL via INTRAVENOUS

## 2023-09-22 MED ORDER — FAT EMUL FISH OIL/PLANT BASED 20% (SMOFLIPID)IV EMUL: 250.0000 mL | INTRAVENOUS | Status: DC

## 2023-09-22 MED ORDER — VECURONIUM BROMIDE 10 MG IV SOLR
10.0000 mg | Freq: Once | INTRAVENOUS | Status: AC
  Administered 2023-09-22: 10 mg via INTRAVENOUS
  Filled 2023-09-22: qty 10

## 2023-09-22 MED ORDER — ORAL CARE MOUTH RINSE: 15.0000 mL | OROMUCOSAL | Status: DC | PRN

## 2023-09-22 MED ORDER — VANCOMYCIN HCL 1500 MG/300ML IV SOLN
1500.0000 mg | Freq: Once | INTRAVENOUS | Status: AC
  Administered 2023-09-22: 1500 mg via INTRAVENOUS
  Filled 2023-09-22: qty 300

## 2023-09-22 MED ORDER — ALBUTEROL SULFATE (2.5 MG/3ML) 0.083% IN NEBU
2.5000 mg | INHALATION_SOLUTION | Freq: Every day | RESPIRATORY_TRACT | Status: DC
  Administered 2023-09-22 – 2023-09-23 (×2): 2.5 mg via RESPIRATORY_TRACT
  Filled 2023-09-22 (×2): qty 3

## 2023-09-22 MED ORDER — METHYLPREDNISOLONE SODIUM SUCC 40 MG IJ SOLR
40.0000 mg | Freq: Once | INTRAMUSCULAR | Status: AC
  Administered 2023-09-22: 40 mg via INTRAVENOUS
  Filled 2023-09-22: qty 1

## 2023-09-22 MED ORDER — FAT EMUL FISH OIL/PLANT BASED 20% (SMOFLIPID)IV EMUL
250.0000 mL | INTRAVENOUS | Status: AC
  Administered 2023-09-22: 250 mL via INTRAVENOUS
  Filled 2023-09-22: qty 250

## 2023-09-22 NOTE — Progress Notes (Signed)
Attempted to contact pts daughter Georgina Quint to inform her Mr. Randleman has been extubated, however she did not answer the telephone.  Will continue to monitor and assess pt.  Zada Girt, AGNP  Pulmonary/Critical Care Pager [phone] (please enter 7 digits) PCCM Consult Pager [phone] (please enter 7 digits)

## 2023-09-22 NOTE — Plan of Care (Signed)
  Problem: Clinical Measurements: Goal: Ability to maintain clinical measurements within normal limits will improve Outcome: Progressing Goal: Respiratory complications will improve Outcome: Progressing Goal: Cardiovascular complication will be avoided Outcome: Progressing   Problem: Nutrition: Goal: Adequate nutrition will be maintained Outcome: Progressing   Problem: Elimination: Goal: Will not experience complications related to bowel motility Outcome: Progressing Goal: Will not experience complications related to urinary retention Outcome: Progressing   Problem: Pain Management: Goal: General experience of comfort will improve Outcome: Progressing   Problem: Safety: Goal: Ability to remain free from injury will improve Outcome: Progressing   Problem: Education: Goal: Knowledge of General Education information will improve Description: Including pain rating scale, medication(s)/side effects and non-pharmacologic comfort measures Outcome: Not Progressing   Problem: Health Behavior/Discharge Planning: Goal: Ability to manage health-related needs will improve Outcome: Not Progressing   Problem: Education: Goal: Knowledge of General Education information will improve Description: Including pain rating scale, medication(s)/side effects and non-pharmacologic comfort measures Outcome: Not Progressing   Problem: Health Behavior/Discharge Planning: Goal: Ability to manage health-related needs will improve Outcome: Not Progressing

## 2023-09-22 NOTE — Progress Notes (Signed)
PHARMACY - TOTAL PARENTERAL NUTRITION CONSULT NOTE   Indication: Prolonged ileus  Patient Measurements: Height: 5\' 11"  (180.3 cm) Weight: 92.2 kg (203 lb 4.2 oz) IBW/kg (Calculated) : 75.3 TPN AdjBW (KG): 93.1 Body mass index is 28.35 kg/m.  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%, CKD stage II, HTN, colon cancer status post transverse colectomy and colostomy, CVA with chronic left-sided hemiparesis starting on TPN   Glucose / Insulin:  --BG 90 - 129 previous 24h --SSI required: 4 units Electrolytes: hypomagnesemia Renal: Scr 0.87 > 1.75 > 1.35 Hepatic: AST wnl, ALT slightly elevated at baseline Intake / Output net (-) 5 L GI Imaging: 09/21/23 Abdominal x-ray morning shows multilobar bilateral pneumonia  GI Surgeries / Procedures:  09/16/23 expiratory laparotomy with lysis of adhesion and creation of new colostomy   Central access: 09/22/23 (pending)  TPN start date:  09/22/23 (pending)   RD Assessment: Estimated Needs Total Energy Estimated Needs: 2029 Total Protein Estimated Needs: 105-120 grams Total Fluid Estimated Needs: 2-2.2 L  Current Nutrition:  NPO  Plan:  ---Start E 8/10 TPN at 1mL/hr at 1800 ---Electrolytes in TPN: Na 35.64mEq/L, K 30.13mEq/L, Ca 4.68mEq/L, Mg 49mEq/L, and Phos 7mmol/L. Cl:Ac 0.49 ---Add standard MVI  to TPN ---continue Moderate q4h SSI and adjust as needed  --2 grams IV magnesium sulfate x 1 ---Monitor TPN labs on Mon/Thurs, daily until stble  Lowella Bandy 09/22/2023,10:11 AM

## 2023-09-22 NOTE — Progress Notes (Signed)
Precedex stopped per verbal order from Zada Girt, NP

## 2023-09-22 NOTE — Progress Notes (Signed)
Patient ID: DRUMMOND HANKES, male   DOB: Nov 02, 1950, 73 y.o.   MRN: [id number]     SURGICAL PROGRESS NOTE   Hospital Day(s): 19.   Interval History: Patient seen and examined as per nurse, no acute events overnight.  Patient still critically ill, sedated on mechanical ventilation.  No significant clinical deterioration or improvement in the last 24 hours.  Vital signs in last 24 hours: [min-max] current  Temp:  [98 F (36.7 C)-101.5 F (38.6 C)] 98.1 F (36.7 C) (01/18 0800) Pulse Rate:  [78-136] 78 (01/18 0800) Resp:  [14-27] 18 (01/18 0800) BP: (70-141)/(54-77) 107/64 (01/18 0830) SpO2:  [88 %-100 %] 100 % (01/18 0800) FiO2 (%):  [50 %-60 %] 50 % (01/18 0758)     Height: 5\' 11"  (180.3 cm) Weight: 92.2 kg BMI (Calculated): 28.36   Physical Exam:  Constitutional: Rectal ill, sedated on mechanical ventilation Respiratory: No respiratory failure on mechanical ventilation Cardiovascular: regular rate and sinus rhythm  Gastrointestinal: soft, non-tender, and non-distended  Labs:     Latest Ref Rng & Units 09/22/2023    5:26 AM 09/21/2023    7:46 AM 09/20/2023    6:15 AM  CBC  WBC 4.0 - 10.5 K/uL 21.8  16.8  11.8   Hemoglobin 13.0 - 17.0 g/dL 8.9  65.7  9.6   Hematocrit 39.0 - 52.0 % 30.1  35.6  31.2   Platelets 150 - 400 K/uL 249  354  264       Latest Ref Rng & Units 09/22/2023    5:26 AM 09/21/2023    7:46 AM 09/20/2023    6:15 AM  CMP  Glucose 70 - 99 mg/dL 846  962  952   BUN 8 - 23 mg/dL 17  19  13    Creatinine 0.61 - 1.24 mg/dL 8.41  3.24  4.01   Sodium 135 - 145 mmol/L 138  140  138   Potassium 3.5 - 5.1 mmol/L 4.8  4.8  4.2   Chloride 98 - 111 mmol/L 104  105  102   CO2 22 - 32 mmol/L 24  23  25    Calcium 8.9 - 10.3 mg/dL 7.5  8.1  8.3   Total Protein 6.5 - 8.1 g/dL  7.0    Total Bilirubin 0.0 - 1.2 mg/dL  0.8    Alkaline Phos 38 - 126 U/L  133    AST 15 - 41 U/L  38    ALT 0 - 44 U/L  48      Imaging studies: Abdominal x-ray morning shows multilobar bilateral  pneumonia.  I personally evaluated the images   Assessment/Plan:  73 y.o. male with SBO and colostomy prolapse 7 Days Post-Op s/p status post expiratory laparotomy with lysis of adhesion and creation of new colostomy, complicated by pertinent comorbidities including aspiration pneumonia, septic shock, respiratory failure.  -Patient continue critically ill.  Now in need of nor epi and vasopressin -He continued having ostomy output -Gastric tube output of 100.  As per main surgical team, they do not recommend to keep enteral diet yet, agree with starting TPN. -Continue critical care as per ICU team -Will continue to follow closely  Gae Gallop, MD

## 2023-09-22 NOTE — Progress Notes (Signed)
NAME:  Calvin [name], MRN:  [id number], DOB:  Feb 05, 1951, LOS: 19 ADMISSION DATE:  09/03/2023, CONSULTATION DATE:  09/21/2023 REFERRING MD:  Manuela Schwartz, NP, CHIEF COMPLAINT:  Acute Respiratory Distress, Aspiration event   Brief Pt Description / Synopsis:  73 y.o. male admitted with PMHx most significant for colon cancer status post transverse colectomy and colostomy, presented with nausea, vomiting, and shortness of breath.  Was admitted with Sepsis due to Influenza A infection, Aspiration pneumonia, and Small Bowel Obstruction.  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  Course further complicated by Aspiration event requiring intubation and mechanical ventilation on 1/17.  History of Present Illness:  Calvin [name] is a 73 y.o. 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   Was admitted by [facility] for further workup and treatment of Was admitted with Sepsis due to Influenza A infection, Aspiration pneumonia, and Small Bowel Obstruction.  Please see "Significant Hospital Events" section below for full detailed hospital course.  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 side (HCC)    Hypertension    Myocardial infarction (HCC)    Neuromuscular disorder (HCC)     Ogilvie syndrome    Seizures (HCC)    last 6 months ago   Stroke [facility])    multiple  left side weakness   Tremors of nervous system    Unstable angina (HCC)      Micro Data:  12/30: SARS-CoV-2/RSV/Flu PCR>>+ Influenza A 12/30: Blood cultures x2>>no growth 12/30: Urine>>Staphylococcus Aureus 12/30: MRSA PCR + 1/17: Tracheal aspirate>>moderate wbc present, predominantly pmn, moderate gram positive cocci in chains, few gram negative rods, rare gram positive rods   Antimicrobials:   Anti-infectives (From admission, onward)    Start     Dose/Rate Route Frequency Ordered Stop   09/22/23 2300  vancomycin (VANCOCIN) IVPB 1000 mg/200 mL premix        1,000 mg 200 mL/hr over 60 Minutes Intravenous Every 24 hours 09/22/23 0819     09/22/23 1000  vancomycin (VANCOREADY) IVPB 1500 mg/300 mL        1,500 mg 150 mL/hr over 120 Minutes Intravenous  Once 09/22/23 0819     09/21/23 0815  piperacillin-tazobactam (ZOSYN) IVPB 3.375 g        3.375 g 12.5 mL/hr over 240 Minutes Intravenous Every 8 hours 09/21/23 0729     09/11/23 2000  cefoTEtan (CEFOTAN) 2 g in sodium chloride 0.9 % 100 mL IVPB        2 g 200 mL/hr over 30 Minutes Intravenous Every 8 hours 09/11/23 1801 09/12/23 1910   09/11/23 0600  cefoTEtan (CEFOTAN) 2 g in sodium chloride 0.9 % 100 mL IVPB        2 g 200 mL/hr over 30  Minutes Intravenous On call to O.R. 09/10/23 0858 09/12/23 1910   09/04/23 1200  Ampicillin-Sulbactam (UNASYN) 3 g in sodium chloride 0.9 % 100 mL IVPB  Status:  Discontinued        3 g 200 mL/hr over 30 Minutes Intravenous Every 6 hours 09/04/23 1052 09/08/23 1537   09/03/23 1800  ceFEPIme (MAXIPIME) 2 g in sodium chloride 0.9 % 100 mL IVPB  Status:  Discontinued        2 g 200 mL/hr over 30 Minutes Intravenous Every 12 hours 09/03/23 0812 09/04/23 0903   09/03/23 1500  metroNIDAZOLE (FLAGYL) IVPB 500 mg  Status:  Discontinued        500 mg 100 mL/hr over 60 Minutes Intravenous Every 12 hours 09/03/23  0758 09/04/23 0903   09/03/23 1000  oseltamivir (TAMIFLU) capsule 30 mg        30 mg Oral 2 times daily 09/03/23 0757 09/08/23 0959   09/03/23 0315  ceFEPIme (MAXIPIME) 2 g in sodium chloride 0.9 % 100 mL IVPB        2 g 200 mL/hr over 30 Minutes Intravenous  Once 09/03/23 0304 09/03/23 0441   09/03/23 0315  metroNIDAZOLE (FLAGYL) IVPB 500 mg        500 mg 100 mL/hr over 60 Minutes Intravenous  Once 09/03/23 0304 09/03/23 0441   09/03/23 0315  vancomycin (VANCOCIN) IVPB 1000 mg/200 mL premix        1,000 mg 200 mL/hr over 60 Minutes Intravenous  Once 09/03/23 0304 09/03/23 0441      Significant Hospital Events: Including procedures, antibiotic start and stop dates in addition to other pertinent events   12/30: admitted to hospitalist sepsis, AKI, (+)Influenza A, likely aspiration pneumonia, acute hypoxic resp fail, SBO. NG placed, surgical consult.  12/31-01/06: relatively stable, remains SBO w/ NG and on TPN, worsening colostomy prolapse. Anemia w/o overt GIB, holding anti-plt/anti-cogs, GI no plans for endoscopic eval at this time 01/07: exlap, LOA, and colostomy revision  01/08: stable.  01/09: d/c NG 01/10: advancing diet as able. Hgb 6.8 - 1 unit PRBC given  01/11: persistent abd pain and cough, imaging concerning for abdominal surgical wound dehiscence deeper layers, going back to OR. Continue w/ cough suppression, await over-read but no obvious CT chest findings, suspect post-viral bronchitis  01/12: to OR early AM for closure of wound dehiscence. Postop stable, NG in place. CT chest question pneumonia but pt reports coughing improved, suspect was mostly reflux, will continue to monitor cough. Postop care per general surgery include clamp NG and remain NPO for now   01/13: NG out, CLD 01/14: FLD, continue advancing diet tolerated  1/15: Advanced to dysphagia diet after ST eval.  Discontinue telemetry, palliative care consult, DC central line 1/16: Underwent CT abdomen/pelvis last  evening.  Not a candidate for another surgery due to poor functional and nutritional status and multiple recent procedures.  Family deciding on goals of care/hospice 1/17: Aspirated this morning with development of severe respiratory distress and hypoxia requiring emergent intubation.  Now with developing septic shock.  Pt's daughter requesting transfer to [facility] or [facility].  Dr. Aleen Campi spoke both facilities which are at capacity and not accepting transfers. 1/18: Pt remains mechanically intubated overnight required increased dose of fentanyl and vecuronium x1 dose due to vent dyssynchrony.  Pt currently unable to follow commands RASS goal -4.  Will perform WUA and for possible SBT.  Pt requiring levophed gtt @5  mcg/min and vasopressin 0.03 units/min to maintain map 65 or  higher   Interim History / Subjective:  As outlined above under significant events   Objective   Blood pressure 107/64, pulse 78, temperature 98.1 F (36.7 C), temperature source Oral, resp. rate 18, height 5\' 11"  (1.803 m), weight 92.2 kg, SpO2 100%. CVP:  [1 mmHg-4 mmHg] 2 mmHg  Vent Mode: PRVC FiO2 (%):  [50 %-60 %] 50 % Set Rate:  [18 bmp] 18 bmp Vt Set:  [500 mL] 500 mL PEEP:  [5 cmH20] 5 cmH20 Plateau Pressure:  [17 cmH20-24 cmH20] 24 cmH20   Intake/Output Summary (Last 24 hours) at 09/22/2023 4098 Last data filed at 09/22/2023 0700 Gross per 24 hour  Intake 2715.81 ml  Output 1120 ml  Net 1595.81 ml   Filed Weights   09/18/23 2010 09/18/23 2100 09/20/23 0500  Weight: 94 kg 94 kg 92.2 kg   Examination: General: Acute on chronically-ill appearing male, NAD mechanicially intubated  HENT: Atraumatic, normocephalic, neck supple, orally intubated Lungs: Faint rhonchi throughout, even, non labored  Cardiovascular: NSR, s1s2, no m/r/g, 2+ radial/1+ distal pulses, no edema Abdomen: Soft, BS present x4, non distended, colostomy and mucous fistula in left upper quadrant dressing dry/intact, stool in bag Extremities: Normal  bulk and tone, no deformities Neuro: Sedated, currently not following commands or withdrawing from painful stimulation, bilateral pupils pinpoint  GU: Indwelling foley catheter in place draining yellow urine  Resolved Hospital Problem list     Assessment & Plan:   #Acute metabolic encephalopathy #Mechanical intubation pain/discomfort  Hx: Seizures  - Maintain a RASS goal of 0 to -1 - PAD protocol to maintain RASS goal: prn fentanyl and precedex gtt  - Avoid sedating medications as able - Continue scheduled baclofen  - Daily wake up assessment - Continue lamictal and keppra - Seizure precautions   #Acute hypoxic & hypercapnic respiratory failure #Large volume aspiration #COPD without acute exacerbation #Influenza A infection (present on admission) - Full vent support for now  - Continue lung protective strategies - Maintain plateau pressures less than 30 cm H20 - Wean FiO2 & PEEP as tolerated to maintain O2 sats >92% - Follow intermittent Chest X-ray & ABG as needed - SBT once respiratory parameters met and mental status permits - Implement VAP Bundle - Scheduled and prn bronchodilator therapy   #Shock: suspect septic +/- hypovolemic #Chronic HFpEF Echocardiogram 05/31/22: LVEF 50-55%, moderate LVH, indeterminate diastolic parameters, RV systolic function normal, mild AR - Continuous telemetry monitoring - Trend lactic acid until normalized - Trend HS Troponin until peaked - Echo pending  - Diuresis as BP and renal function permits~holding due to shock  #AKI on CKD stage II~improving  #BPH - Trend BMP  - Replace electrolytes as indicated  - Strict I&O's   #Aspiration pneumonia #Influenza Infection (POA)~TREATED - Trend WBC and monitor fever curve  - Trend PCT - Follow cultures  - Continue abx as outlined above pending culture results and sensitivities   #Small bowel obstruction vs ileus #Colostomy irrigation/prolapse #Hx: ogilvie syndrome s/p colostomy - NPO;  OGT to LIS - General Surgery following, appreciate input: will start TPN per recommendations   #Anemia - Trend CBC  - Monitor for s/sx of bleeding  - Transfuse for hgb <7   Patient is critically ill with shock and multiorgan failure.  Prognosis is guarded, high risk for further decompensation, cardiac arrest and death.  Given current critical illness superimposed on multiple chronic comorbidities including colon cancer, along with advanced age, overall long-term prognosis is poor.  Recommend consideration for DNR status.  Palliative care is following for ongoing goals of care discussions. Best Practice (right click and "Reselect all SmartList Selections" daily)   Diet/type: NPO; will start TPN  DVT prophylaxis: LMWH GI prophylaxis: H2B and PPI Lines: Central line and yes and it is still needed Foley:  Yes, and it is still needed Code Status:  full code Last date of multidisciplinary goals of care discussion [09/21/22]  1/18: Will update pts daughter today  Labs   CBC: Recent Labs  Lab 09/18/23 0437 09/19/23 0657 09/20/23 0615 09/21/23 0746 09/22/23 0526  WBC 7.2 8.3 11.8* 16.8* 21.8*  HGB 10.0* 9.2* 9.6* 10.6* 8.9*  HCT 32.3* 28.8* 31.2* 35.6* 30.1*  MCV 93.6 91.1 93.7 94.7 97.4  PLT 209 216 264 354 249    Basic Metabolic Panel: Recent Labs  Lab 09/18/23 0437 09/19/23 0657 09/20/23 0615 09/21/23 0746 09/22/23 0526  NA 136 137 138 140 138  K 4.7 4.1 4.2 4.8 4.8  CL 103 101 102 105 104  CO2 24 25 25 23 24   GLUCOSE 74 97 105* 138* 132*  BUN 13 11 13 19 17   CREATININE 0.99 1.03 1.16 1.75* 1.35*  CALCIUM 8.3* 8.2* 8.3* 8.1* 7.5*  PHOS  --   --   --   --  4.4   GFR: Estimated Creatinine Clearance: 55.7 mL/min (A) (by C-G formula based on SCr of 1.35 mg/dL (H)). Recent Labs  Lab 09/19/23 0657 09/20/23 0615 09/21/23 0746 09/21/23 1007 09/22/23 0526  WBC 8.3 11.8* 16.8*  --  21.8*  LATICACIDVEN  --   --  1.2 2.0*  --     Liver Function Tests: Recent Labs   Lab 09/21/23 0746 09/22/23 0526  AST 38  --   ALT 48*  --   ALKPHOS 133*  --   BILITOT 0.8  --   PROT 7.0  --   ALBUMIN 2.6* 2.1*   No results for input(s): "LIPASE", "AMYLASE" in the last 168 hours. No results for input(s): "AMMONIA" in the last 168 hours.  ABG    Component Value Date/Time   PHART 7.26 (L) 09/21/2023 0646   PCO2ART 51 (H) 09/21/2023 0646   PO2ART 67 (L) 09/21/2023 0646   HCO3 22.9 09/21/2023 0646   ACIDBASEDEF 4.4 (H) 09/21/2023 0646   O2SAT 91.2 09/21/2023 0646     Coagulation Profile: No results for input(s): "INR", "PROTIME" in the last 168 hours.  Cardiac Enzymes: No results for input(s): "CKTOTAL", "CKMB", "CKMBINDEX", "TROPONINI" in the last 168 hours.  HbA1C: Hgb A1c MFr Bld  Date/Time Value Ref Range Status  05/28/2022 06:17 PM 5.8 (H) 4.8 - 5.6 % Final    Comment:    (NOTE) Pre diabetes:          5.7%-6.4%  Diabetes:              >6.4%  Glycemic control for   <7.0% adults with diabetes   08/09/2019 07:31 AM 5.7 (H) 4.8 - 5.6 % Final    Comment:    (NOTE) Pre diabetes:          5.7%-6.4% Diabetes:              >6.4% Glycemic control for   <7.0% adults with diabetes     CBG: Recent Labs  Lab 09/21/23 1601 09/21/23 1951 09/21/23 2319 09/22/23 0352 09/22/23 0731  GLUCAP 120* 90 111* 117* 112*    Review of Systems:   Unable to assess due to Intubation/sedation/critical illness   Past Medical History:  He,  has a past medical history of Alcohol abuse, Anemia, Anxiety, Arthritis, Cancer (HCC), COPD (chronic obstructive pulmonary disease) (HCC), Coronary artery disease, Depression, Gout, Hemiplegia and 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 side (HCC), Hypertension, Myocardial infarction (HCC), Neuromuscular disorder (HCC), Ogilvie syndrome, Seizures (HCC), Stroke (HCC), Tremors of nervous system, and Unstable angina (HCC).   Surgical History:   Past Surgical History:  Procedure Laterality  Date   CARPAL TUNNEL RELEASE Left 10/19/2015   Procedure: CARPAL TUNNEL RELEASE;  Surgeon: Kennedy Bucker, MD;  Location: [facility];  Service: Orthopedics;  Laterality: Left;   COLONOSCOPY WITH PROPOFOL N/A 10/26/2021   Procedure: COLONOSCOPY WITH PROPOFOL;  Surgeon: Regis Bill, MD;  Location: [facility];  Service: Endoscopy;  Laterality: N/A;   COLONOSCOPY WITH PROPOFOL N/A 06/02/2022   Procedure: COLONOSCOPY WITH PROPOFOL;  Surgeon: Regis Bill, MD;  Location: [facility];  Service: Endoscopy;  Laterality: N/A;   COLONOSCOPY WITH PROPOFOL N/A 06/06/2022   Procedure: COLONOSCOPY WITH PROPOFOL;  Surgeon: Regis Bill, MD;  Location: [facility];  Service: Endoscopy;  Laterality: N/A;   COLOSTOMY REVISION N/A 09/11/2023   Procedure: COLOSTOMY REVISION;  Surgeon: Henrene Dodge, MD;  Location: [facility];  Service: General;  Laterality: N/A;   JOINT REPLACEMENT     left partial hip    LAPAROSCOPIC SIGMOID COLECTOMY  09/12/2010   Lap hand assisted sigmoidectomy, mobilization splenic flexure -- Dr. Freida Busman   LAPAROTOMY N/A 09/11/2023   Procedure: EXPLORATORY LAPAROTOMY;  Surgeon: Henrene Dodge, MD;  Location: [facility];  Service: General;  Laterality: N/A;   LAPAROTOMY N/A 09/15/2023   Procedure: EXPLORATORY LAPAROTOMY;  Surgeon: Sung Amabile, DO;  Location: [facility];  Service: General;  Laterality: N/A;   LYSIS OF ADHESION N/A 09/11/2023   Procedure: LYSIS OF ADHESION;  Surgeon: Henrene Dodge, MD;  Location: [facility];  Service: General;  Laterality: N/A;   THROAT SURGERY  09/05/2011   cancer     Social History:   reports that he has quit smoking. His smoking use included cigarettes. He has never used smokeless tobacco. He reports that he does not currently use alcohol. He reports that he does not use drugs.   Family History:  His family history includes Cancer in his mother; Hypertension in his father; Leukemia in his brother.   Allergies No Known Allergies   Home  Medications  Prior to Admission medications   Medication Sig Start Date End Date Taking? Authorizing Provider  aspirin 81 MG chewable tablet Take 81 mg by mouth daily.   Yes [provider]  baclofen (LIORESAL) 5 mg TABS tablet Take 5 mg by mouth 2 (two) times daily.   Yes [provider]  diazePAM, 20 MG Dose, (VALTOCO 20 MG DOSE) 2 x 10 MG/0.1ML LQPK Place 20 mg into the nose as needed (seizure lasting over 2 minutes). 08/23/23  Yes Leroy Sea, MD  ipratropium-albuterol (DUONEB) 0.5-2.5 (3) MG/3ML SOLN Inhale 3 mLs into the lungs every 6 (six) hours as needed. 09/02/23  Yes [provider]  lactulose (CHRONULAC) 10 GM/15ML solution Take 20 g by mouth daily. 08/15/23  Yes [provider]  lamoTRIgine (LAMICTAL) 25 MG tablet Take 50 mg by mouth 2 (two) times daily.   Yes [provider]  levETIRAcetam (KEPPRA) 1000 MG tablet Take 1 tablet (1,000 mg total) by mouth 2 (two) times daily. 08/23/23  Yes Leroy Sea, MD  Melatonin 10 MG TABS Take 10 mg by mouth at bedtime.   Yes [provider]  nitroGLYCERIN (NITROSTAT) 0.4 MG SL tablet Place 0.4 mg under the tongue 3 (three) times daily as needed for chest pain. Every 5 minutes up to 3 doses 08/24/23  Yes [provider]  pantoprazole (PROTONIX) 40 MG tablet Take 1 tablet (40 mg total) by mouth daily. 08/23/23  Yes Leroy Sea, MD  polyethylene glycol (MIRALAX / GLYCOLAX) 17 g packet Take 17 g by mouth 2 (two) times daily. 08/19/23  Yes Wieting, Richard, MD  pregabalin (LYRICA) 100 MG capsule Take 200 mg by mouth at bedtime.   Yes [provider]  senna (SENOKOT) 8.6 MG TABS tablet Take 2 tablets (17.2 mg total) by mouth at bedtime as needed for mild constipation or moderate constipation. 06/26/23  Yes Sunnie Nielsen, DO  tamsulosin (FLOMAX) 0.4 MG CAPS capsule Take 0.4 mg by mouth every evening.   Yes [provider]  divalproex (DEPAKOTE) 125 MG DR  tablet Take 4 tablets (500 mg total) by mouth 2 (two) times daily for 1 day, THEN 4 tablets (500 mg total) daily for 3 days, THEN 2 tablets (250 mg total) daily for 3 days, THEN 1 tablet (125 mg total) daily for 3 days. Patient not taking: Reported on 09/03/2023 08/23/23 09/03/23  Leroy Sea, MD  fluticasone [facility]) 50 MCG/ACT nasal spray Place 2 sprays into both nostrils daily. Patient not taking: Reported on 09/03/2023 05/15/23   [provider]  Mouthwashes (MOUTH RINSE) LIQD solution 15 mLs by Mouth Rinse route every 2 (two) hours. Patient not taking: Reported on 09/03/2023 08/19/23   Alford Highland, MD  pregabalin (LYRICA) 50 MG capsule Take 50 mg capsule twice daily and 100 mg (two capsules) at bedtime Patient not taking: Reported on 09/03/2023 08/23/23   Leroy Sea, MD  tiotropium (SPIRIVA) 18 MCG inhalation capsule Place 18 mcg into inhaler and inhale daily. Patient not taking: Reported on 09/03/2023    [provider]     Critical care time: 48 minutes    Zada Girt, AGNP  Pulmonary/Critical Care Pager [phone] (please enter 7 digits) PCCM Consult Pager [phone] (please enter 7 digits)

## 2023-09-22 NOTE — Progress Notes (Signed)
Pharmacy Antibiotic Note  Calvin [name] is a 73 y.o. 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  Pharmacy has been consulted for vancomycin dosing.  Plan: start Vancomycin 1500 mg IV x 1 then 1000 mg IV every 24 hours Goal AUC 400-550. Expected AUC: 464 SCr used: 1.35 mg/dL Ke 4.098J-1, B1/4 78.2N   Height: 5\' 11"  (180.3 cm) Weight: 92.2 kg (203 lb 4.2 oz) IBW/kg (Calculated) : 75.3  Temp (24hrs), Avg:99.7 F (37.6 C), Min:98 F (36.7 C), Max:101.5 F (38.6 C)  Recent Labs  Lab 09/18/23 0437 09/19/23 0657 09/20/23 0615 09/21/23 0746 09/21/23 1007 09/22/23 0526  WBC 7.2 8.3 11.8* 16.8*  --  21.8*  CREATININE 0.99 1.03 1.16 1.75*  --  1.35*  LATICACIDVEN  --   --   --  1.2 2.0*  --     Estimated Creatinine Clearance: 55.7 mL/min (A) (by C-G formula based on SCr of 1.35 mg/dL (H)).    No Known Allergies  Antimicrobials this admission: 12/31 Unasyn >> 01/04 01/17 Zosyn >>  01/18 vancomycin >>  Microbiology results: 12/30 BCx: NG final 01/17 Sputum: GPC chains, GNR, GPR (pending)  12/30 UCx MRSA 12/30 MRSA PCR: positive  Thank you for allowing pharmacy to be a part of this patient's care.  Lowella Bandy 09/22/2023 8:11 AM

## 2023-09-22 NOTE — Progress Notes (Signed)
Patient was extubated to BIPAP. No issues with extubation. Patient's oxygen saturation is 97% on 40% FiO2. RN, NP and MD at bedside during extubation.

## 2023-09-22 NOTE — Progress Notes (Signed)
Attempted to contact pts daughter Georgina Quint via telephone to inform her Mr. Kisling is now extubated and currently off of Bipap.  However, she did not answer the telephone.  I left a HIPAA appropriate voicemail instructing her to return my phone call.  Zada Girt, AGNP  Pulmonary/Critical Care Pager [phone] (please enter 7 digits) PCCM Consult Pager [phone] (please enter 7 digits)

## 2023-09-22 NOTE — Progress Notes (Signed)
Nutrition Follow-up  DOCUMENTATION CODES:   Not applicable  INTERVENTION:   - TPN management per Pharmacy  NUTRITION DIAGNOSIS:   Inadequate oral intake related to altered GI function as evidenced by NPO status.  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  12/31 - s/p BSE with plan for dysphagia 1 diet with nectar thick liquids when cleared for diet per SLP 01/01 - NGT placed (KUB on 09/05/23 revealed tip of tube in stomach)  01/03 - PICC placed, TPN start 01/04 - NGT fell out and replaced 01/06 - NGT pulled again 01/07 - s/p ex-lap, lysis of adhesions, revision of loop colostomy into end colostomy with mucous fistula 01/09 - NGT d/c, advanced to clear liquid diet 01/10 - advanced to full liquids 01/11 - TPN d/c 01/12 - s/p closure of wound dehiscence  01/13 - advanced to clear liquid diet, NGT d/c 01/14 - advanced to full liquid diet 01/15 - s/p BSE, advanced to dysphagia 1 diet with nectar thick liquids, CT scan revealed fascial dehiscence in 1-2 areas of midline incision  01/17 - transferred to ICU due to aspiration event, emergently intubated  RD working remotely.  Received consult for new TPN. Noted pt with aspiration event 1/17 which required transfer to ICU and intubation. Per General Surgery recommendations, enteral feeds are being deferred at this time. Plan  is for TPN. Noted no enteral meds are being administered per tube at this time per [facility] documentation. Pt does have an 45 French OG tube in antral pre-pyloric region of the stomach per x-ray. OG tube is currently to low intermittent suction with brown/yellow output. Abdominal x-ray also states "bowel gas pattern remains concerning for potential SBO, although ileus is not entirely excluded."  Pt with non-pitting edema to BLE per nursing edema assessment. Difficult to determine true dry weight at this time.  Admission weight: 97.8 kg Current weight: 92.2 kg  Drips: Fentanyl: off this AM Precedex: off this AM Levophed: 3 mcg/min Vasopressin: off this AM  Medications reviewed and include: SSI every 4 hours, colace (not receiving), lactulose (not receiving), IV protonix, miralax (not receiving), IV magnesium sulfate 2 grams x 1, IV abx  Labs reviewed: creatinine 1.35, magnesium 1.6, WBC 21.8, hemoglobin 8.9 CBG's: 90-120 x 24 hours  UOP: 798 mL x 24 hours NGT: 100 mL x 24 hours (1500 mL x 24 hours day prior) Colostomy: 250 mL x 24 hours I/O's: -10.0 L since admission  Diet Order:   Diet Order             Diet NPO time specified  Diet effective now                   EDUCATION NEEDS:   Not appropriate for education at this time  Skin:  Skin Assessment: Skin Integrity Issues: Unstageable: bilateral heels Incisions: closed abdomen  Last BM:  09/22/23 via colostomy  Height:   Ht  Readings from Last 1 Encounters:  09/04/23 5\' 11"  (1.803 m)    Weight:   Wt Readings from Last 1 Encounters:  09/20/23 92.2 kg    Ideal Body Weight:  78.1 kg  BMI:  Body mass index is 28.35 kg/m.  Estimated Nutritional Needs:   Kcal:  2200-2400  Protein:  110-125 grams  Fluid:  >2.0 L    Mertie Clause, MS, RD, LDN Registered Dietitian II Please see AMiON for contact information.

## 2023-09-22 NOTE — Progress Notes (Signed)
  Echocardiogram 2D Echocardiogram has been performed. Definity IV ultrasound imaging agent used on this study.  Calvin [name] 09/22/2023, 11:46 AM

## 2023-09-23 ENCOUNTER — Inpatient Hospital Stay: Payer: Medicare Other

## 2023-09-23 DIAGNOSIS — J9601 Acute respiratory failure with hypoxia: Secondary | ICD-10-CM | POA: Diagnosis not present

## 2023-09-23 LAB — BLOOD GAS, ARTERIAL
Acid-Base Excess: 1 mmol/L (ref 0.0–2.0)
Bicarbonate: 27.9 mmol/L (ref 20.0–28.0)
FIO2: 40 %
O2 Saturation: 96.2 %
PEEP: 5 cmH2O
Patient temperature: 37
RATE: 18 {breaths}/min
Spontaneous VT: 500 mL
pCO2 arterial: 53 mm[Hg] — ABNORMAL HIGH (ref 32–48)
pH, Arterial: 7.33 — ABNORMAL LOW (ref 7.35–7.45)
pO2, Arterial: 73 mm[Hg] — ABNORMAL LOW (ref 83–108)

## 2023-09-23 LAB — RENAL FUNCTION PANEL
Albumin: 2 g/dL — ABNORMAL LOW (ref 3.5–5.0)
Anion gap: 10 (ref 5–15)
BUN: 18 mg/dL (ref 8–23)
CO2: 23 mmol/L (ref 22–32)
Calcium: 7.6 mg/dL — ABNORMAL LOW (ref 8.9–10.3)
Chloride: 104 mmol/L (ref 98–111)
Creatinine, Ser: 0.97 mg/dL (ref 0.61–1.24)
GFR, Estimated: >60 mL/min (ref >60)
Glucose, Bld: 182 mg/dL — ABNORMAL HIGH (ref 70–99)
Phosphorus: 2.5 mg/dL (ref 2.5–4.6)
Potassium: 4.3 mmol/L (ref 3.5–5.1)
Sodium: 137 mmol/L (ref 135–145)

## 2023-09-23 LAB — CBC
HCT: 26.5 % — ABNORMAL LOW (ref 39.0–52.0)
Hemoglobin: 8 g/dL — ABNORMAL LOW (ref 13.0–17.0)
MCH: 28.9 pg (ref 26.0–34.0)
MCHC: 30.2 g/dL (ref 30.0–36.0)
MCV: 95.7 fL (ref 80.0–100.0)
Platelets: 212 10*3/uL (ref 150–400)
RBC: 2.77 MIL/uL — ABNORMAL LOW (ref 4.22–5.81)
RDW: 14.6 % (ref 11.5–15.5)
WBC: 16.9 10*3/uL — ABNORMAL HIGH (ref 4.0–10.5)
nRBC: 0 % (ref 0.0–0.2)

## 2023-09-23 LAB — GLUCOSE, CAPILLARY
Glucose-Capillary: 125 mg/dL — ABNORMAL HIGH (ref 70–99)
Glucose-Capillary: 138 mg/dL — ABNORMAL HIGH (ref 70–99)
Glucose-Capillary: 148 mg/dL — ABNORMAL HIGH (ref 70–99)
Glucose-Capillary: 150 mg/dL — ABNORMAL HIGH (ref 70–99)
Glucose-Capillary: 166 mg/dL — ABNORMAL HIGH (ref 70–99)
Glucose-Capillary: 169 mg/dL — ABNORMAL HIGH (ref 70–99)

## 2023-09-23 LAB — MAGNESIUM: Magnesium: 2.4 mg/dL (ref 1.7–2.4)

## 2023-09-23 MED ORDER — ROCURONIUM BROMIDE 10 MG/ML (PF) SYRINGE
PREFILLED_SYRINGE | INTRAVENOUS | Status: AC
  Administered 2023-09-23: 100 mg via INTRAVENOUS
  Filled 2023-09-23: qty 10

## 2023-09-23 MED ORDER — MIDAZOLAM HCL 2 MG/2ML IJ SOLN: 4.0000 mg | INTRAMUSCULAR | Status: AC

## 2023-09-23 MED ORDER — PROPOFOL 1000 MG/100ML IV EMUL
5.0000 ug/kg/min | INTRAVENOUS | Status: DC
  Administered 2023-09-23: 5 ug/kg/min via INTRAVENOUS
  Administered 2023-09-23: 40 ug/kg/min via INTRAVENOUS
  Administered 2023-09-24 (×2): 50 ug/kg/min via INTRAVENOUS
  Administered 2023-09-24 (×2): 40 ug/kg/min via INTRAVENOUS
  Administered 2023-09-24: 60 ug/kg/min via INTRAVENOUS
  Administered 2023-09-24: 50 ug/kg/min via INTRAVENOUS
  Administered 2023-09-25: 55 ug/kg/min via INTRAVENOUS
  Administered 2023-09-25: 50 ug/kg/min via INTRAVENOUS
  Administered 2023-09-25: 55 ug/kg/min via INTRAVENOUS
  Administered 2023-09-25: 40 ug/kg/min via INTRAVENOUS
  Administered 2023-09-25 (×2): 50 ug/kg/min via INTRAVENOUS
  Administered 2023-09-26 – 2023-09-28 (×10): 30 ug/kg/min via INTRAVENOUS
  Filled 2023-09-23 (×25): qty 100

## 2023-09-23 MED ORDER — FENTANYL CITRATE PF 50 MCG/ML IJ SOSY: 25.0000 ug | PREFILLED_SYRINGE | INTRAMUSCULAR | Status: DC | PRN

## 2023-09-23 MED ORDER — FENTANYL CITRATE PF 50 MCG/ML IJ SOSY
25.0000 ug | PREFILLED_SYRINGE | INTRAMUSCULAR | Status: DC | PRN
  Administered 2023-09-23: 50 ug via INTRAVENOUS
  Filled 2023-09-23 (×2): qty 1

## 2023-09-23 MED ORDER — FAT EMUL FISH OIL/PLANT BASED 20% (SMOFLIPID)IV EMUL
250.0000 mL | INTRAVENOUS | Status: AC
Start: 2023-09-23 — End: 2023-09-24
  Administered 2023-09-23: 250 mL via INTRAVENOUS
  Filled 2023-09-23: qty 250

## 2023-09-23 MED ORDER — FENTANYL CITRATE PF 50 MCG/ML IJ SOSY
25.0000 ug | PREFILLED_SYRINGE | INTRAMUSCULAR | Status: DC | PRN
Start: 2023-09-23 — End: 2023-09-24
  Administered 2023-09-23: 100 ug via INTRAVENOUS
  Administered 2023-09-24 (×2): 50 ug via INTRAVENOUS
  Filled 2023-09-23: qty 1
  Filled 2023-09-23: qty 2

## 2023-09-23 MED ORDER — KETAMINE HCL 50 MG/5ML IJ SOSY: 100.0000 mg | PREFILLED_SYRINGE | INTRAMUSCULAR | Status: AC

## 2023-09-23 MED ORDER — VANCOMYCIN HCL IN DEXTROSE 1-5 GM/200ML-% IV SOLN
1000.0000 mg | Freq: Two times a day (BID) | INTRAVENOUS | Status: DC
Start: 2023-09-23 — End: 2023-09-24
  Administered 2023-09-23 (×2): 1000 mg via INTRAVENOUS
  Filled 2023-09-23 (×4): qty 200

## 2023-09-23 MED ORDER — MIDAZOLAM HCL 2 MG/2ML IJ SOLN
INTRAMUSCULAR | Status: AC
  Administered 2023-09-23: 4 mg via INTRAVENOUS
  Filled 2023-09-23: qty 4

## 2023-09-23 MED ORDER — ROCURONIUM BROMIDE 10 MG/ML (PF) SYRINGE: 100.0000 mg | PREFILLED_SYRINGE | INTRAVENOUS | Status: AC

## 2023-09-23 MED ORDER — KETAMINE HCL 50 MG/5ML IJ SOSY
PREFILLED_SYRINGE | INTRAMUSCULAR | Status: AC
  Administered 2023-09-23: 100 mg via INTRAVENOUS
  Filled 2023-09-23: qty 10

## 2023-09-23 MED ORDER — INFUVITE ADULT IV SOLN
INTRAVENOUS | Status: AC
  Filled 2023-09-23: qty 2000

## 2023-09-23 MED ORDER — DOCUSATE SODIUM 50 MG/5ML PO LIQD: 100.0000 mg | Freq: Two times a day (BID) | ORAL | Status: DC

## 2023-09-23 NOTE — Progress Notes (Signed)
Updated pts daughter Georgina Quint and another family member at bedside regarding pts condition and current plan of care.  All questions were answered.  Will continue to monitor and assess pt.  Zada Girt, AGNP  Pulmonary/Critical Care Pager [phone] (please enter 7 digits) PCCM Consult Pager [phone] (please enter 7 digits)

## 2023-09-23 NOTE — Plan of Care (Signed)
  Problem: Clinical Measurements: Goal: Will remain free from infection Outcome: Progressing Goal: Diagnostic test results will improve Outcome: Progressing Goal: Cardiovascular complication will be avoided Outcome: Progressing   Problem: Nutrition: Goal: Adequate nutrition will be maintained Outcome: Progressing   Problem: Coping: Goal: Level of anxiety will decrease Outcome: Progressing   Problem: Elimination: Goal: Will not experience complications related to bowel motility Outcome: Progressing Goal: Will not experience complications related to urinary retention Outcome: Progressing   Problem: Pain Management: Goal: General experience of comfort will improve Outcome: Progressing   Problem: Safety: Goal: Ability to remain free from injury will improve Outcome: Progressing   Problem: Education: Goal: Knowledge of disease or condition will improve Outcome: Progressing Goal: Knowledge of the prescribed therapeutic regimen will improve Outcome: Progressing Goal: Individualized Educational Video(s) Outcome: Progressing   Problem: Activity: Goal: Ability to tolerate increased activity will improve Outcome: Progressing   Problem: Respiratory: Goal: Ability to maintain a clear airway will improve Outcome: Progressing   Problem: Activity: Goal: Ability to tolerate increased activity will improve Outcome: Progressing   Problem: Respiratory: Goal: Ability to maintain a clear airway and adequate ventilation will improve Outcome: Progressing   Problem: Role Relationship: Goal: Method of communication will improve Outcome: Progressing   Problem: Coping: Goal: Ability to adjust to condition or change in health will improve Outcome: Progressing   Problem: Fluid Volume: Goal: Ability to maintain a balanced intake and output will improve Outcome: Progressing   Problem: Health Behavior/Discharge Planning: Goal: Ability to identify and utilize available resources and  services will improve Outcome: Progressing Goal: Ability to manage health-related needs will improve Outcome: Progressing   Problem: Metabolic: Goal: Ability to maintain appropriate glucose levels will improve Outcome: Progressing   Problem: Nutritional: Goal: Maintenance of adequate nutrition will improve Outcome: Progressing Goal: Progress toward achieving an optimal weight will improve Outcome: Progressing   Problem: Skin Integrity: Goal: Risk for impaired skin integrity will decrease Outcome: Progressing   Problem: Tissue Perfusion: Goal: Adequacy of tissue perfusion will improve Outcome: Progressing   Problem: Education: Goal: Knowledge of General Education information will improve Description: Including pain rating scale, medication(s)/side effects and non-pharmacologic comfort measures Outcome: Not Progressing   Problem: Health Behavior/Discharge Planning: Goal: Ability to manage health-related needs will improve Outcome: Not Progressing   Problem: Clinical Measurements: Goal: Ability to maintain clinical measurements within normal limits will improve Outcome: Not Progressing Goal: Respiratory complications will improve Outcome: Not Progressing   Problem: Activity: Goal: Risk for activity intolerance will decrease Outcome: Not Progressing   Problem: Skin Integrity: Goal: Risk for impaired skin integrity will decrease Outcome: Not Progressing   Problem: Activity: Goal: Will verbalize the importance of balancing activity with adequate rest periods Outcome: Not Progressing   Problem: Respiratory: Goal: Levels of oxygenation will improve Outcome: Not Progressing Goal: Ability to maintain adequate ventilation will improve Outcome: Not Progressing   Problem: Education: Goal: Ability to describe self-care measures that may prevent or decrease complications (Diabetes Survival Skills Education) will improve Outcome: Not Progressing Goal: Individualized  Educational Video(s) Outcome: Not Progressing

## 2023-09-23 NOTE — Progress Notes (Signed)
Patient in A-fib with a rate in the 50s and 60s, provider notified and ordered stat EKG.

## 2023-09-23 NOTE — Progress Notes (Signed)
NAME:  Calvin [name], MRN:  [id number], DOB:  1951/02/25, LOS: 20 ADMISSION DATE:  09/03/2023, CONSULTATION DATE:  09/21/2023 REFERRING MD:  Manuela Schwartz, NP, CHIEF COMPLAINT:  Acute Respiratory Distress, Aspiration event   Brief Pt Description / Synopsis:  73 y.o. male admitted with PMHx most significant for colon cancer status post transverse colectomy and colostomy, presented with nausea, vomiting, and shortness of breath.  Was admitted with Sepsis due to Influenza A infection, Aspiration pneumonia, and Small Bowel Obstruction.  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  Course further complicated by Aspiration event requiring intubation and mechanical ventilation on 1/17.  History of Present Illness:  Calvin [name] is a 73 y.o. 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   Was admitted by [facility] for further workup and treatment of Was admitted with Sepsis due to Influenza A infection, Aspiration pneumonia, and Small Bowel Obstruction.  Please see "Significant Hospital Events" section below for full detailed hospital course.  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 side (HCC)    Hypertension    Myocardial infarction (HCC)    Neuromuscular disorder (HCC)     Ogilvie syndrome    Seizures (HCC)    last 6 months ago   Stroke [facility])    multiple  left side weakness   Tremors of nervous system    Unstable angina (HCC)      Micro Data:  12/30: SARS-CoV-2/RSV/Flu PCR>>+ Influenza A 12/30: Blood cultures x2>>negative  12/30: Urine>>Staphylococcus Aureus 12/30: MRSA PCR + 1/17: Tracheal aspirate>>moderate pseudomonas   Anti-infectives (From admission, onward)    Start     Dose/Rate Route Frequency Ordered Stop   09/23/23 1100  vancomycin (VANCOCIN) IVPB 1000 mg/200 mL premix        1,000 mg 200 mL/hr over 60 Minutes Intravenous Every 12 hours 09/23/23 0811     09/22/23 2300  vancomycin (VANCOCIN) IVPB 1000 mg/200 mL premix  Status:  Discontinued        1,000 mg 200 mL/hr over 60 Minutes Intravenous Every 24 hours 09/22/23 0819 09/23/23 0811   09/22/23 1000  vancomycin (VANCOREADY) IVPB 1500 mg/300 mL        1,500 mg 150 mL/hr over 120 Minutes Intravenous  Once 09/22/23 0819 09/22/23 1916   09/21/23 0815  piperacillin-tazobactam (ZOSYN) IVPB 3.375 g        3.375 g 12.5 mL/hr over 240 Minutes Intravenous Every 8 hours 09/21/23 0729     09/11/23 2000  cefoTEtan (CEFOTAN) 2 g in sodium chloride 0.9 % 100 mL IVPB        2 g 200 mL/hr over 30 Minutes Intravenous Every 8 hours 09/11/23 1801 09/12/23 1910   09/11/23 0600  cefoTEtan (CEFOTAN) 2 g in sodium chloride  0.9 % 100 mL IVPB        2 g 200 mL/hr over 30 Minutes Intravenous On call to O.R. 09/10/23 0858 09/12/23 1910   09/04/23 1200  Ampicillin-Sulbactam (UNASYN) 3 g in sodium chloride 0.9 % 100 mL IVPB  Status:  Discontinued        3 g 200 mL/hr over 30 Minutes Intravenous Every 6 hours 09/04/23 1052 09/08/23 1537   09/03/23 1800  ceFEPIme (MAXIPIME) 2 g in sodium chloride 0.9 % 100 mL IVPB  Status:  Discontinued        2 g 200 mL/hr over 30 Minutes Intravenous Every 12 hours 09/03/23 0812 09/04/23 0903   09/03/23 1500  metroNIDAZOLE (FLAGYL) IVPB 500 mg  Status:  Discontinued         500 mg 100 mL/hr over 60 Minutes Intravenous Every 12 hours 09/03/23 0758 09/04/23 0903   09/03/23 1000  oseltamivir (TAMIFLU) capsule 30 mg        30 mg Oral 2 times daily 09/03/23 0757 09/08/23 0959   09/03/23 0315  ceFEPIme (MAXIPIME) 2 g in sodium chloride 0.9 % 100 mL IVPB        2 g 200 mL/hr over 30 Minutes Intravenous  Once 09/03/23 0304 09/03/23 0441   09/03/23 0315  metroNIDAZOLE (FLAGYL) IVPB 500 mg        500 mg 100 mL/hr over 60 Minutes Intravenous  Once 09/03/23 0304 09/03/23 0441   09/03/23 0315  vancomycin (VANCOCIN) IVPB 1000 mg/200 mL premix        1,000 mg 200 mL/hr over 60 Minutes Intravenous  Once 09/03/23 0304 09/03/23 0441      Significant Hospital Events: Including procedures, antibiotic start and stop dates in addition to other pertinent events   12/30: admitted to hospitalist sepsis, AKI, (+)Influenza A, likely aspiration pneumonia, acute hypoxic resp fail, SBO. NG placed, surgical consult.  12/31-01/06: relatively stable, remains SBO w/ NG and on TPN, worsening colostomy prolapse. Anemia w/o overt GIB, holding anti-plt/anti-cogs, GI no plans for endoscopic eval at this time 01/07: exlap, LOA, and colostomy revision  01/08: stable.  01/09: d/c NG 01/10: advancing diet as able. Hgb 6.8 - 1 unit PRBC given  01/11: persistent abd pain and cough, imaging concerning for abdominal surgical wound dehiscence deeper layers, going back to OR. Continue w/ cough suppression, await over-read but no obvious CT chest findings, suspect post-viral bronchitis  01/12: to OR early AM for closure of wound dehiscence. Postop stable, NG in place. CT chest question pneumonia but pt reports coughing improved, suspect was mostly reflux, will continue to monitor cough. Postop care per general surgery include clamp NG and remain NPO for now   01/13: NG out, CLD 01/14: FLD, continue advancing diet tolerated  1/15: Advanced to dysphagia diet after ST eval.  Discontinue telemetry, palliative  care consult, DC central line 1/16: Underwent CT abdomen/pelvis last evening.  Not a candidate for another surgery due to poor functional and nutritional status and multiple recent procedures.  Family deciding on goals of care/hospice 1/17: Aspirated this morning with development of severe respiratory distress and hypoxia requiring emergent intubation.  Now with developing septic shock.  Pt's daughter requesting transfer to [facility] or [facility].  Dr. Aleen Campi spoke both facilities which are at capacity and not accepting transfers. 1/18: Pt remains mechanically intubated overnight required increased dose of fentanyl and vecuronium x1 dose due to vent dyssynchrony.  Pt currently unable to follow commands RASS goal -4.  Will perform WUA and for  possible SBT.  Pt requiring levophed gtt @5  mcg/min and vasopressin 0.03 units/min to maintain map 65 or higher.  Pt extubated to Bipap able to follow commands  1/19: Pt with increased work of breathing due to increased secretions despite scheduled hypertonic saline, Chest PT, and Bipap requiring reintubation   Interim History / Subjective:  As outlined above under significant events   Objective   Blood pressure 121/72, pulse 84, temperature (!) 97.5 F (36.4 C), temperature source Axillary, resp. rate (!) 37, height 5\' 11"  (1.803 m), weight 93.3 kg, SpO2 100%.    Vent Mode: SIMV/PC/PS FiO2 (%):  [40 %] 40 % Set Rate:  [18 bmp] 18 bmp Vt Set:  [500 mL] 500 mL PEEP:  [5 cmH20] 5 cmH20   Intake/Output Summary (Last 24 hours) at 09/23/2023 0947 Last data filed at 09/23/2023 0700 Gross per 24 hour  Intake 2021.74 ml  Output 1135 ml  Net 886.74 ml   Filed Weights   09/18/23 2100 09/20/23 0500 09/23/23 0400  Weight: 94 kg 92.2 kg 93.3 kg   Examination: General: Acute on chronically-ill appearing male, NAD mechanicially intubated  HENT: Atraumatic, normocephalic, neck supple, orally intubated Lungs: Rhonchi throughout, even, non labored  Cardiovascular: NSR, s1s2,  no m/r/g, 2+ radial/1+ distal pulses, no edema Abdomen: Soft, BS present x4, non distended, colostomy and mucous fistula in left upper quadrant dressing dry/intact, stool in bag dressing dry/intact Extremities: Normal bulk and tone, no deformities Neuro: Sedated, currently not following commands or withdrawing from painful stimulation, PERRL GU: Indwelling foley catheter in place draining yellow urine  Resolved Hospital Problem list     Assessment & Plan:   #Acute metabolic encephalopathy #Mechanical intubation pain/discomfort  Hx: Seizures  - Maintain a RASS goal of 0 to -1 - PAD protocol to maintain RASS goal: propofol gtt and prn fentanyl  - Avoid sedating medications as able - Continue scheduled baclofen  - Daily wake up assessment - Continue lamictal and keppra - Seizure precautions   #Acute hypoxic & hypercapnic respiratory failure #Large volume aspiration #COPD without acute exacerbation #Influenza A infection (present on admission) - Full vent support for now  - Continue lung protective strategies - Maintain plateau pressures less than 30 cm H20 - Wean FiO2 & PEEP as tolerated to maintain O2 sats >92% - Follow intermittent Chest X-ray & ABG as needed - SBT once respiratory parameters met and mental status permits - Implement VAP Bundle - Scheduled and prn bronchodilator therapy  - Hypertonic saline  - Unable to tolerate Chest PT due to tachycardia   #Shock: suspect septic +/- hypovolemic #Chronic HFpEF Echocardiogram 09/22/23: EF 50%; left ventricle demonstrates global hypokinesis; mildly elevated pulmonary artery systolic pressure; large pleural effusion in the left lateral region; trivial mitral valve regurgitation; trivial aortic valve regurgitation  - Continuous telemetry monitoring - Trend lactic acid until normalized - Troponin peaked at 25 - Diuresis as BP and renal function permits  #AKI on CKD stage II~improving  #BPH - Trend BMP  - Replace  electrolytes as indicated  - Strict I&O's   #Aspiration pneumonia #Pseudomonas pneumonia  #Influenza Infection (POA)~TREATED - Trend WBC and monitor fever curve  - Trend PCT - Follow cultures  - Continue abx as outlined pending sensitivities   #Small bowel obstruction vs ileus #Colostomy irrigation/prolapse #Hx: ogilvie syndrome s/p colostomy - NPO; OGT to LIS - General Surgery following, appreciate input: continue TPN per recommendations   #Anemia - Trend CBC  - Monitor for s/sx of bleeding  - Transfuse  for hgb <7   #Prediabetes - CBG's q4hrs  - SSI   Patient is critically ill with shock and multiorgan failure.  Prognosis is guarded, high risk for further decompensation, cardiac arrest and death.  Given current critical illness superimposed on multiple chronic comorbidities including colon cancer, along with advanced age, overall long-term prognosis is poor.  Recommend consideration for DNR status.  Palliative care is following for ongoing goals of care discussions. Best Practice (right click and "Reselect all SmartList Selections" daily)   Diet/type: NPO; TPN  DVT prophylaxis: LMWH GI prophylaxis: H2B and PPI Lines: Central line and yes and it is still needed Foley:  Yes, and it is still needed Code Status:  full code Last date of multidisciplinary goals of care discussion [09/22/22]  1/19: Updated pts daughter Georgina Quint extensively via telephone informing her pt extubated on 01/20 to Bipap.  However, this morning pts respiratory status declined despite Bipap.  Discussed goals of care she stated she wants everything done, and would like Mr. Chernick reintubated.  She would like to speak with General Surgery when she arrives at bedside to discuss plans regarding possible surgical procedures going forward Labs   CBC: Recent Labs  Lab 09/19/23 0657 09/20/23 0615 09/21/23 0746 09/22/23 0526 09/23/23 0411  WBC 8.3 11.8* 16.8* 21.8* 16.9*  HGB 9.2* 9.6* 10.6* 8.9* 8.0*   HCT 28.8* 31.2* 35.6* 30.1* 26.5*  MCV 91.1 93.7 94.7 97.4 95.7  PLT 216 264 354 249 212    Basic Metabolic Panel: Recent Labs  Lab 09/19/23 0657 09/20/23 0615 09/21/23 0746 09/22/23 0526 09/22/23 0958 09/23/23 0411  NA 137 138 140 138  --  137  K 4.1 4.2 4.8 4.8  --  4.3  CL 101 102 105 104  --  104  CO2 25 25 23 24   --  23  GLUCOSE 97 105* 138* 132*  --  182*  BUN 11 13 19 17   --  18  CREATININE 1.03 1.16 1.75* 1.35*  --  0.97  CALCIUM 8.2* 8.3* 8.1* 7.5*  --  7.6*  MG  --   --   --   --  1.6* 2.4  PHOS  --   --   --  4.4  --  2.5   GFR: Estimated Creatinine Clearance: 78 mL/min (by C-G formula based on SCr of 0.97 mg/dL). Recent Labs  Lab 09/20/23 0615 09/21/23 0746 09/21/23 1007 09/22/23 0526 09/22/23 0958 09/23/23 0411  WBC 11.8* 16.8*  --  21.8*  --  16.9*  LATICACIDVEN  --  1.2 2.0*  --  1.1  --     Liver Function Tests: Recent Labs  Lab 09/21/23 0746 09/22/23 0526 09/23/23 0411  AST 38  --   --   ALT 48*  --   --   ALKPHOS 133*  --   --   BILITOT 0.8  --   --   PROT 7.0  --   --   ALBUMIN 2.6* 2.1* 2.0*   No results for input(s): "LIPASE", "AMYLASE" in the last 168 hours. No results for input(s): "AMMONIA" in the last 168 hours.  ABG    Component Value Date/Time   PHART 7.36 09/22/2023 1520   PCO2ART 44 09/22/2023 1520   PO2ART 70 (L) 09/22/2023 1520   HCO3 24.9 09/22/2023 1520   ACIDBASEDEF 0.8 09/22/2023 1520   O2SAT 96.6 09/22/2023 1520     Coagulation Profile: No results for input(s): "INR", "PROTIME" in the last 168 hours.  Cardiac Enzymes:  No results for input(s): "CKTOTAL", "CKMB", "CKMBINDEX", "TROPONINI" in the last 168 hours.  HbA1C: Hgb A1c MFr Bld  Date/Time Value Ref Range Status  05/28/2022 06:17 PM 5.8 (H) 4.8 - 5.6 % Final    Comment:    (NOTE) Pre diabetes:          5.7%-6.4%  Diabetes:              >6.4%  Glycemic control for   <7.0% adults with diabetes   08/09/2019 07:31 AM 5.7 (H) 4.8 - 5.6 % Final     Comment:    (NOTE) Pre diabetes:          5.7%-6.4% Diabetes:              >6.4% Glycemic control for   <7.0% adults with diabetes     CBG: Recent Labs  Lab 09/22/23 1651 09/22/23 1945 09/22/23 2339 09/23/23 0409 09/23/23 0741  GLUCAP 108* 120* 182* 166* 125*    Review of Systems:   Unable to assess due to Intubation/sedation/critical illness  Past Medical History:  He,  has a past medical history of Alcohol abuse, Anemia, Anxiety, Arthritis, Cancer (HCC), COPD (chronic obstructive pulmonary disease) (HCC), Coronary artery disease, Depression, Gout, Hemiplegia and 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 side (HCC), Hypertension, Myocardial infarction (HCC), Neuromuscular disorder (HCC), Ogilvie syndrome, Seizures (HCC), Stroke (HCC), Tremors of nervous system, and Unstable angina (HCC).   Surgical History:   Past Surgical History:  Procedure Laterality Date   CARPAL TUNNEL RELEASE Left 10/19/2015   Procedure: CARPAL TUNNEL RELEASE;  Surgeon: Kennedy Bucker, MD;  Location: [facility] ORS;  Service: Orthopedics;  Laterality: Left;   COLONOSCOPY WITH PROPOFOL N/A 10/26/2021   Procedure: COLONOSCOPY WITH PROPOFOL;  Surgeon: Regis Bill, MD;  Location: [facility];  Service: Endoscopy;  Laterality: N/A;   COLONOSCOPY WITH PROPOFOL N/A 06/02/2022   Procedure: COLONOSCOPY WITH PROPOFOL;  Surgeon: Regis Bill, MD;  Location: [facility];  Service: Endoscopy;  Laterality: N/A;   COLONOSCOPY WITH PROPOFOL N/A 06/06/2022   Procedure: COLONOSCOPY WITH PROPOFOL;  Surgeon: Regis Bill, MD;  Location: [facility];  Service: Endoscopy;  Laterality: N/A;   COLOSTOMY REVISION N/A 09/11/2023   Procedure: COLOSTOMY REVISION;  Surgeon: Henrene Dodge, MD;  Location: [facility];  Service: General;  Laterality: N/A;   JOINT REPLACEMENT     left partial hip    LAPAROSCOPIC SIGMOID COLECTOMY  09/12/2010   Lap hand assisted sigmoidectomy,  mobilization splenic flexure -- Dr. Freida Busman   LAPAROTOMY N/A 09/11/2023   Procedure: EXPLORATORY LAPAROTOMY;  Surgeon: Henrene Dodge, MD;  Location: [facility];  Service: General;  Laterality: N/A;   LAPAROTOMY N/A 09/15/2023   Procedure: EXPLORATORY LAPAROTOMY;  Surgeon: Sung Amabile, DO;  Location: [facility];  Service: General;  Laterality: N/A;   LYSIS OF ADHESION N/A 09/11/2023   Procedure: LYSIS OF ADHESION;  Surgeon: Henrene Dodge, MD;  Location: [facility];  Service: General;  Laterality: N/A;   THROAT SURGERY  09/05/2011   cancer     Social History:   reports that he has quit smoking. His smoking use included cigarettes. He has never used smokeless tobacco. He reports that he does not currently use alcohol. He reports that he does not use drugs.   Family History:  His family history includes Cancer in his mother; Hypertension in his father; Leukemia in his brother.   Allergies No Known Allergies   Home Medications  Prior to Admission medications   Medication Sig Start Date  End Date Taking? Authorizing Provider  aspirin 81 MG chewable tablet Take 81 mg by mouth daily.   Yes [provider]  baclofen (LIORESAL) 5 mg TABS tablet Take 5 mg by mouth 2 (two) times daily.   Yes [provider]  diazePAM, 20 MG Dose, (VALTOCO 20 MG DOSE) 2 x 10 MG/0.1ML LQPK Place 20 mg into the nose as needed (seizure lasting over 2 minutes). 08/23/23  Yes Leroy Sea, MD  ipratropium-albuterol (DUONEB) 0.5-2.5 (3) MG/3ML SOLN Inhale 3 mLs into the lungs every 6 (six) hours as needed. 09/02/23  Yes [provider]  lactulose (CHRONULAC) 10 GM/15ML solution Take 20 g by mouth daily. 08/15/23  Yes [provider]  lamoTRIgine (LAMICTAL) 25 MG tablet Take 50 mg by mouth 2 (two) times daily.   Yes [provider]  levETIRAcetam (KEPPRA) 1000 MG tablet Take 1 tablet (1,000 mg total) by mouth 2 (two) times daily. 08/23/23  Yes Leroy Sea, MD  Melatonin 10 MG TABS  Take 10 mg by mouth at bedtime.   Yes [provider]  nitroGLYCERIN (NITROSTAT) 0.4 MG SL tablet Place 0.4 mg under the tongue 3 (three) times daily as needed for chest pain. Every 5 minutes up to 3 doses 08/24/23  Yes [provider]  pantoprazole (PROTONIX) 40 MG tablet Take 1 tablet (40 mg total) by mouth daily. 08/23/23  Yes Leroy Sea, MD  polyethylene glycol (MIRALAX / GLYCOLAX) 17 g packet Take 17 g by mouth 2 (two) times daily. 08/19/23  Yes Wieting, Richard, MD  pregabalin (LYRICA) 100 MG capsule Take 200 mg by mouth at bedtime.   Yes [provider]  senna (SENOKOT) 8.6 MG TABS tablet Take 2 tablets (17.2 mg total) by mouth at bedtime as needed for mild constipation or moderate constipation. 06/26/23  Yes Sunnie Nielsen, DO  tamsulosin (FLOMAX) 0.4 MG CAPS capsule Take 0.4 mg by mouth every evening.   Yes [provider]  divalproex (DEPAKOTE) 125 MG DR tablet Take 4 tablets (500 mg total) by mouth 2 (two) times daily for 1 day, THEN 4 tablets (500 mg total) daily for 3 days, THEN 2 tablets (250 mg total) daily for 3 days, THEN 1 tablet (125 mg total) daily for 3 days. Patient not taking: Reported on 09/03/2023 08/23/23 09/03/23  Leroy Sea, MD  fluticasone [facility]) 50 MCG/ACT nasal spray Place 2 sprays into both nostrils daily. Patient not taking: Reported on 09/03/2023 05/15/23   [provider]  Mouthwashes (MOUTH RINSE) LIQD solution 15 mLs by Mouth Rinse route every 2 (two) hours. Patient not taking: Reported on 09/03/2023 08/19/23   Alford Highland, MD  pregabalin (LYRICA) 50 MG capsule Take 50 mg capsule twice daily and 100 mg (two capsules) at bedtime Patient not taking: Reported on 09/03/2023 08/23/23   Leroy Sea, MD  tiotropium (SPIRIVA) 18 MCG inhalation capsule Place 18 mcg into inhaler and inhale daily. Patient not taking: Reported on 09/03/2023    [provider]     Critical care time: 3  minutes    Zada Girt, AGNP  Pulmonary/Critical Care Pager [phone] (please enter 7 digits) PCCM Consult Pager [phone] (please enter 7 digits)

## 2023-09-23 NOTE — Progress Notes (Signed)
PHARMACY - TOTAL PARENTERAL NUTRITION CONSULT NOTE   Indication: Prolonged ileus  Patient Measurements: Height: 5\' 11"  (180.3 cm) Weight: 93.3 kg (205 lb 11 oz) IBW/kg (Calculated) : 75.3 TPN AdjBW (KG): 93.1 Body mass index is 28.69 kg/m.  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%, CKD stage II, HTN, colon cancer status post transverse colectomy and colostomy, CVA with chronic left-sided hemiparesis starting on TPN   Glucose / Insulin:  --BG 102 - 182 previous 24h --SSI required: 6 units Electrolytes: wnl Renal: Scr 0.87 > 1.75 > 0.97 Hepatic: AST wnl, ALT slightly elevated at baseline Intake / Output net (-) 5 L GI Imaging: 09/21/23 Abdominal x-ray morning shows multilobar bilateral pneumonia  GI Surgeries / Procedures:  09/16/23 expiratory laparotomy with lysis of adhesion and creation of new colostomy   Central access: 09/22/23  TPN start date:  09/22/23   RD Assessment: Estimated Needs Total Energy Estimated Needs: 2200-2400 Total Protein Estimated Needs: 110-125 grams Total Fluid Estimated Needs: >2.0 L  Current Nutrition:  NPO  Plan:  ---advance E 8/10 TPN to 39mL/hr at 1800 + 250 mL 20% DMOF lipids over 12 hours ---Electrolytes in TPN: Na 35.20mEq/L, K 30.45mEq/L, Ca 4.64mEq/L, Mg 20mEq/L, and Phos 60mmol/L. Cl:Ac 0.49 ---Add standard MVI  to TPN (trace elements on hold ISO shortage) ---continue Moderate q4h SSI and adjust as needed  ---Monitor TPN labs on Mon/Thurs, daily until stble  Lowella Bandy 09/23/2023,6:59 AM

## 2023-09-23 NOTE — Progress Notes (Signed)
Patient has had an eventful day, this morning patient was on bipap but went into respiratory distress.  NP and MD to bedside and orders received for ketamine 100mg  IV, Rocuronium 100mg , and Versed 4mg  and patient intubated by NP with RT MD this nurse and charge nurse at bedside.  Patient tolerated well, confirmed with CXR.

## 2023-09-23 NOTE — Procedures (Addendum)
Endotracheal Intubation: Patient required placement of an artificial airway secondary to acute respiratory failure    Consent: Emergent.    Hand washing performed prior to starting the procedure.    Medications administered for sedation prior to procedure:  Versed 4 mg IV, 100 mg Ketamine IV, Rocuronium 90 mg IV     A time out procedure was called and correct patient, name, & ID confirmed. Needed supplies and equipment were assembled and checked to include ETT, 10 ml syringe, Glidescope, Mac and Miller blades, suction, oxygen and bag mask valve, end tidal CO2 monitor.    Patient was positioned to align the mouth and pharynx to facilitate visualization of the glottis.    Heart rate, SpO2 and blood pressure was continuously monitored during the procedure. Pre-oxygenation was conducted prior to intubation and endotracheal tube was placed through the vocal cords into the trachea.       The artificial airway was placed under direct visualization via glidescope route using a 7.5 ETT on the first attempt.   ETT was secured at 24 cm.   Placement was confirmed by auscuitation of lungs with good breath sounds bilaterally and no stomach sounds.  Condensation was noted on endotracheal tube.   Pulse ox 97% CO2 detector in place with appropriate color change.    Complications: None .        Chest radiograph ordered and pending.    Zada Girt, AGNP  Pulmonary/Critical Care Pager [phone] (please enter 7 digits) PCCM Consult Pager [phone] (please enter 7 digits)

## 2023-09-23 NOTE — Progress Notes (Signed)
Patient ID: Calvin [name], male   DOB: [birth year], 73 y.o.   MRN: [id number]     SURGICAL PROGRESS NOTE   Hospital Day(s): 20.   Interval History: Patient seen and examined.  Patient continue critically ill, respiratory failure on mechanical ventilation.  Patient failed extubation yesterday.  Back to mechanical ventilation.  Vital signs in last 24 hours: [min-max] current  Temp:  [97.2 F (36.2 C)-98 F (36.7 C)] 97.5 F (36.4 C) (01/19 0400) Pulse Rate:  [67-102] 84 (01/19 0706) Resp:  [18-37] 37 (01/19 0706) BP: (69-122)/(50-79) 121/72 (01/19 0700) SpO2:  [96 %-100 %] 100 % (01/19 0706) FiO2 (%):  [40 %] 40 % (01/19 0751) Weight:  [93.3 kg] 93.3 kg (01/19 0400)     Height: 5\' 11"  (180.3 cm) Weight: 93.3 kg BMI (Calculated): 28.7   Physical Exam:  Constitutional: Critically ill, sedated on mechanical ventilation. Respiratory: breathing non-labored at rest  Cardiovascular: regular rate and sinus rhythm  Gastrointestinal: Midline wound draining serous fluid.  Colostomy is pink and patent.  Labs:     Latest Ref Rng & Units 09/23/2023    4:11 AM 09/22/2023    5:26 AM 09/21/2023    7:46 AM  CBC  WBC 4.0 - 10.5 K/uL 16.9  21.8  16.8   Hemoglobin 13.0 - 17.0 g/dL 8.0  8.9  78.2   Hematocrit 39.0 - 52.0 % 26.5  30.1  35.6   Platelets 150 - 400 K/uL 212  249  354       Latest Ref Rng & Units 09/23/2023    4:11 AM 09/22/2023    5:26 AM 09/21/2023    7:46 AM  CMP  Glucose 70 - 99 mg/dL 956  213  086   BUN 8 - 23 mg/dL 18  17  19    Creatinine 0.61 - 1.24 mg/dL 5.78  4.69  6.29   Sodium 135 - 145 mmol/L 137  138  140   Potassium 3.5 - 5.1 mmol/L 4.3  4.8  4.8   Chloride 98 - 111 mmol/L 104  104  105   CO2 22 - 32 mmol/L 23  24  23    Calcium 8.9 - 10.3 mg/dL 7.6  7.5  8.1   Total Protein 6.5 - 8.1 g/dL   7.0   Total Bilirubin 0.0 - 1.2 mg/dL   0.8   Alkaline Phos 38 - 126 U/L   133   AST 15 - 41 U/L   38   ALT 0 - 44 U/L   48     Imaging studies: No new pertinent imaging  studies   Assessment/Plan:  73 y.o. male with SBO and colostomy prolapse, wound dehiscence with IV separation status post abdominal wall closure 7 Days Post-Op.   Small bowel obstruction -S/p lysis of adhesion -Patient having bowel movement through the colostomy -Will continue TPN due to concern of aspiration and still not adequate formation from the GI tract  Colostomy prolapse -Due to the severity of colostomy prolapse, colostomy revision was done with formation of new colostomy -There is adequate output through the colostomy  Respiratory failure on mechanical ventilation -Due to aspiration pneumonia -Extubation trial yesterday failed.  Patient unable to manage secretions -Continue management as per ICU team  History of wound dehiscence -Patient with initial dehiscence after exploratory laparotomy taken back to the OR for closure abdominal wall -There is partial midline fascial dehiscence with drainage of serous fluid through the wound -Patient will be treated as a hernia as patient  is having adequate stool output through the colostomy. -This does not seem to be the cause of aspiration and provide clinical deterioration -Any further surgical intervention will be discussed by Dr. Aleen Campi -Patient continue critically ill.  There is no acute abdomen or any indication for urgent surgical intervention at this moment.  Any surgical intervention at this moment will exacerbate his clinical alteration.  Gae Gallop, MD

## 2023-09-23 NOTE — Progress Notes (Signed)
Pharmacy Antibiotic Note  Calvin [name] is a 73 y.o. 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  Pharmacy has been consulted for vancomycin and Zosyn dosing. Renal function has improved and is now near apparent baseline  Plan:   1) adjust vancomycin to 1000 mg IV every 12 hours Goal AUC 400-550. Expected AUC: 470.6 SCr used: 0.97 mg/dL Ke 6.269S-8, N4/6 27.0J  2) continue Zosyn 3.375 grams IV every 8 hours (EI)  Height: 5\' 11"  (180.3 cm) Weight: 93.3 kg (205 lb 11 oz) IBW/kg (Calculated) : 75.3  Temp (24hrs), Avg:97.7 F (36.5 C), Min:97.2 F (36.2 C), Max:98 F (36.7 C)  Recent Labs  Lab 09/19/23 0657 09/20/23 0615 09/21/23 0746 09/21/23 1007 09/22/23 0526 09/22/23 0958 09/23/23 0411  WBC 8.3 11.8* 16.8*  --  21.8*  --  16.9*  CREATININE 1.03 1.16 1.75*  --  1.35*  --  0.97  LATICACIDVEN  --   --  1.2 2.0*  --  1.1  --     Estimated Creatinine Clearance: 78 mL/min (by C-G formula based on SCr of 0.97 mg/dL).    No Known Allergies  Antimicrobials this admission: 12/31 Unasyn >> 01/04 01/17 Zosyn >>  01/18 vancomycin >>  Microbiology results: 12/30 BCx: NG final 01/17 Sputum: pseudomonas, GNR, GPR (pending)  12/30 UCx MRSA 01/18 MRSA PCR: positive 12/30 MRSA PCR: positive  Thank you for allowing pharmacy to be a part of this patient's care.  Lowella Bandy 09/23/2023 8:09 AM

## 2023-09-23 NOTE — Progress Notes (Signed)
Per provider, due to patient's gastric output and GI status hold po/per tube meds today.

## 2023-09-23 NOTE — Plan of Care (Signed)
Continuing with plan of care. 

## 2023-09-24 ENCOUNTER — Inpatient Hospital Stay: Payer: Medicare Other

## 2023-09-24 DIAGNOSIS — J9601 Acute respiratory failure with hypoxia: Secondary | ICD-10-CM | POA: Diagnosis not present

## 2023-09-24 DIAGNOSIS — J449 Chronic obstructive pulmonary disease, unspecified: Secondary | ICD-10-CM

## 2023-09-24 DIAGNOSIS — G9341 Metabolic encephalopathy: Secondary | ICD-10-CM | POA: Diagnosis not present

## 2023-09-24 DIAGNOSIS — J09X1 Influenza due to identified novel influenza A virus with pneumonia: Secondary | ICD-10-CM

## 2023-09-24 DIAGNOSIS — J9602 Acute respiratory failure with hypercapnia: Secondary | ICD-10-CM

## 2023-09-24 LAB — PHOSPHORUS
Phosphorus: 1.7 mg/dL — ABNORMAL LOW (ref 2.5–4.6)
Phosphorus: 1.7 mg/dL — ABNORMAL LOW (ref 2.5–4.6)
Phosphorus: 4.5 mg/dL (ref 2.5–4.6)

## 2023-09-24 LAB — COMPREHENSIVE METABOLIC PANEL
ALT: 16 U/L (ref 0–44)
AST: 12 U/L — ABNORMAL LOW (ref 15–41)
Albumin: 2 g/dL — ABNORMAL LOW (ref 3.5–5.0)
Alkaline Phosphatase: 74 U/L (ref 38–126)
Anion gap: 9 (ref 5–15)
BUN: 29 mg/dL — ABNORMAL HIGH (ref 8–23)
CO2: 23 mmol/L (ref 22–32)
Calcium: 8.1 mg/dL — ABNORMAL LOW (ref 8.9–10.3)
Chloride: 108 mmol/L (ref 98–111)
Creatinine, Ser: 0.95 mg/dL (ref 0.61–1.24)
GFR, Estimated: >60 mL/min (ref >60)
Glucose, Bld: 133 mg/dL — ABNORMAL HIGH (ref 70–99)
Potassium: 3.7 mmol/L (ref 3.5–5.1)
Sodium: 140 mmol/L (ref 135–145)
Total Bilirubin: 0.6 mg/dL (ref 0.0–1.2)
Total Protein: 6.1 g/dL — ABNORMAL LOW (ref 6.5–8.1)

## 2023-09-24 LAB — CBC
HCT: 25.4 % — ABNORMAL LOW (ref 39.0–52.0)
Hemoglobin: 7.7 g/dL — ABNORMAL LOW (ref 13.0–17.0)
MCH: 28.4 pg (ref 26.0–34.0)
MCHC: 30.3 g/dL (ref 30.0–36.0)
MCV: 93.7 fL (ref 80.0–100.0)
Platelets: 248 10*3/uL (ref 150–400)
RBC: 2.71 MIL/uL — ABNORMAL LOW (ref 4.22–5.81)
RDW: 14.6 % (ref 11.5–15.5)
WBC: 18 10*3/uL — ABNORMAL HIGH (ref 4.0–10.5)
nRBC: 0.3 % — ABNORMAL HIGH (ref 0.0–0.2)

## 2023-09-24 LAB — CULTURE, RESPIRATORY W GRAM STAIN

## 2023-09-24 LAB — GLUCOSE, CAPILLARY
Glucose-Capillary: 102 mg/dL — ABNORMAL HIGH (ref 70–99)
Glucose-Capillary: 105 mg/dL — ABNORMAL HIGH (ref 70–99)
Glucose-Capillary: 108 mg/dL — ABNORMAL HIGH (ref 70–99)
Glucose-Capillary: 135 mg/dL — ABNORMAL HIGH (ref 70–99)
Glucose-Capillary: 90 mg/dL (ref 70–99)
Glucose-Capillary: 93 mg/dL (ref 70–99)

## 2023-09-24 LAB — MAGNESIUM
Magnesium: 2.4 mg/dL (ref 1.7–2.4)
Magnesium: 2.5 mg/dL — ABNORMAL HIGH (ref 1.7–2.4)

## 2023-09-24 LAB — TRIGLYCERIDES: Triglycerides: 145 mg/dL (ref <150)

## 2023-09-24 MED ORDER — HYDROMORPHONE HCL 1 MG/ML IJ SOLN
1.0000 mg | INTRAMUSCULAR | Status: DC | PRN
  Administered 2023-09-24 (×3): 1 mg via INTRAVENOUS
  Administered 2023-09-25: 2 mg via INTRAVENOUS
  Administered 2023-09-25: 1 mg via INTRAVENOUS
  Administered 2023-09-25: 2 mg via INTRAVENOUS
  Administered 2023-09-25 – 2023-09-26 (×4): 1 mg via INTRAVENOUS
  Filled 2023-09-24 (×2): qty 1
  Filled 2023-09-24 (×2): qty 2
  Filled 2023-09-24 (×5): qty 1
  Filled 2023-09-24: qty 2

## 2023-09-24 MED ORDER — ORAL CARE MOUTH RINSE
15.0000 mL | OROMUCOSAL | Status: DC
  Administered 2023-09-24 – 2023-09-28 (×49): 15 mL via OROMUCOSAL

## 2023-09-24 MED ORDER — PIVOT 1.5 CAL PO LIQD: 1000.0000 mL | ORAL | Status: DC

## 2023-09-24 MED ORDER — POTASSIUM & SODIUM PHOSPHATES 280-160-250 MG PO PACK
1.0000 | PACK | Freq: Three times a day (TID) | ORAL | Status: AC
  Administered 2023-09-24 (×3): 1
  Filled 2023-09-24 (×3): qty 1

## 2023-09-24 MED ORDER — IPRATROPIUM-ALBUTEROL 0.5-2.5 (3) MG/3ML IN SOLN
3.0000 mL | Freq: Three times a day (TID) | RESPIRATORY_TRACT | Status: DC
  Administered 2023-09-24 – 2023-10-06 (×36): 3 mL via RESPIRATORY_TRACT
  Filled 2023-09-24 (×36): qty 3

## 2023-09-24 MED ORDER — PREGABALIN 75 MG PO CAPS
200.0000 mg | ORAL_CAPSULE | Freq: Every day | ORAL | Status: DC
  Administered 2023-09-24 – 2023-10-05 (×11): 200 mg
  Filled 2023-09-24 (×12): qty 1

## 2023-09-24 MED ORDER — LACTULOSE 10 GM/15ML PO SOLN
20.0000 g | Freq: Every day | ORAL | Status: DC
  Administered 2023-09-24 – 2023-09-26 (×3): 20 g
  Filled 2023-09-24 (×3): qty 30

## 2023-09-24 MED ORDER — BACLOFEN 10 MG PO TABS
5.0000 mg | ORAL_TABLET | Freq: Two times a day (BID) | ORAL | Status: DC
  Administered 2023-09-24 – 2023-09-27 (×7): 5 mg
  Filled 2023-09-24 (×7): qty 0.5

## 2023-09-24 MED ORDER — VITAL 1.5 CAL PO LIQD
1000.0000 mL | ORAL | Status: DC
  Administered 2023-09-24: 1000 mL

## 2023-09-24 MED ORDER — LAMOTRIGINE 25 MG PO TABS
50.0000 mg | ORAL_TABLET | Freq: Two times a day (BID) | ORAL | Status: DC
  Administered 2023-09-24 – 2023-10-05 (×20): 50 mg
  Filled 2023-09-24 (×22): qty 2

## 2023-09-24 MED ORDER — PREGABALIN 50 MG PO CAPS
100.0000 mg | ORAL_CAPSULE | Freq: Every day | ORAL | Status: DC
  Administered 2023-09-24 – 2023-10-05 (×9): 100 mg
  Filled 2023-09-24 (×12): qty 2

## 2023-09-24 MED ORDER — SENNA 8.6 MG PO TABS: 2.0000 | ORAL_TABLET | Freq: Every evening | ORAL | Status: DC | PRN

## 2023-09-24 MED ORDER — POLYETHYLENE GLYCOL 3350 17 G PO PACK
17.0000 g | PACK | Freq: Two times a day (BID) | ORAL | Status: DC
  Administered 2023-09-24 – 2023-09-26 (×5): 17 g
  Filled 2023-09-24 (×5): qty 1

## 2023-09-24 MED ORDER — CLINIMIX E/DEXTROSE (8/10) 8 % IV SOLN
INTRAVENOUS | Status: AC
  Filled 2023-09-24: qty 2000

## 2023-09-24 MED ORDER — ENOXAPARIN SODIUM 40 MG/0.4ML IJ SOSY
40.0000 mg | PREFILLED_SYRINGE | Freq: Every day | INTRAMUSCULAR | Status: DC
  Administered 2023-09-24 – 2023-09-29 (×6): 40 mg via SUBCUTANEOUS
  Filled 2023-09-24 (×6): qty 0.4

## 2023-09-24 MED ORDER — ORAL CARE MOUTH RINSE
15.0000 mL | OROMUCOSAL | Status: DC | PRN
Start: 2023-09-24 — End: 2023-09-28

## 2023-09-24 MED ORDER — FREE WATER
30.0000 mL | Status: DC
Start: 2023-09-24 — End: 2023-09-28
  Administered 2023-09-24 – 2023-09-28 (×21): 30 mL

## 2023-09-24 MED ORDER — ACETAMINOPHEN 325 MG PO TABS
650.0000 mg | ORAL_TABLET | Freq: Four times a day (QID) | ORAL | Status: DC
  Administered 2023-09-24 – 2023-09-28 (×14): 650 mg
  Filled 2023-09-24 (×14): qty 2

## 2023-09-24 NOTE — Progress Notes (Signed)
Nutrition Follow-up  DOCUMENTATION CODES:   Not applicable  INTERVENTION:   -TPN management per pharmacy -TF via OGT:   Trickle tube feeds: Vital 1.5 @ 20 ml/hr  Once able to advance, increase by 10 ml every 4 hours to goal rate of 50 ml/hr.   60 ml Prosource TF BID   30 ml free water flush every 4 hours    Tube feeding regimen provides 1960 kcal (100% of needs), 121 grams of protein, and 917 ml of H2O. Total free water: 1097 ml daily  NUTRITION DIAGNOSIS:   Inadequate oral intake related to altered GI function as evidenced by NPO status.  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  12/31- s/p BSE- plan for dysphagia 1 diet with nectar thick liquids when cleared for diet per SLP 1/1- NGT placed (per KUB on 09/05/23 revealed tip of tube in stomach)  1/3- PICC placed, TPN 1/4- NGT fell out and replaced 1/6- NGT pulled again 1/7- s/p Exploratory Laparotomy, lysis of adhesions, revision of loop colostomy into end colostomy with mucous fistula 1/9- NGT d/c, advanced to clear liquid diet 1/10- advanced to full liquids 1/11- TPN d/c 1/12- s/p closure of wound dehiscence  1/13- advanced to clear liquid diet, NGT d/c 1/14- advanced to full liquid diet 1/15- s/p BSE- advanced to dysphagia 1 diet with nectar thick liquids; CT scan revealed fascial dehiscence in 1-2 areas of midline incision  1/17- transferred to ICU due to aspiration event, emergently  intubated 1/18- TPN initiated, extubated 1/19- re-intubated   Patient is currently intubated on ventilator support MV: 11.2 L/min Temp (24hrs), Avg:97.2 F (36.2 C), Min:96.2 F (35.7 C), Max:97.7 F (36.5 C)  Reviewed I/O's: +128 ml x 24 hours and +5 L since 09/10/23  UOP: 2 L x 24 hours  OGT output: 250 ml x 24 hours  Colostomy output: 50 ml x 24 hours  Propofol: 28 ml/hr (provides 739 kcals daily)  Case discussed with RN; plan continues to watchful waiting and no further surgery. Per general surgery and PCCM, KUB has improved and plan to start trickle feeds today.   Pt remains on TPN, which is currently infusing at goal rate of 84 ml/hr, which provides 1331 kcals and 161 grams protein, which meets 71% of estimated kcal needs and 100% of estimated protein needs.   Wt has been stable over the past week.   Medications reviewed and include colace, pulmicort, lactulose, protonix, miralax, keppra, levophed, and propofol.   Labs reviewed: Phos: 1.7, CBGS: 105-169 (inpatient orders for glycemic control are 0-15 units insulin aspart every 4 hours).    Diet Order:   Diet Order             Diet NPO time specified  Diet effective now                   EDUCATION NEEDS:   Not appropriate for education at this time  Skin:  Skin Assessment: Skin Integrity Issues: Skin Integrity Issues:: Incisions  Unstageable: rt and lt heels Incisions: closed abdomen  Last BM:  09/23/23 (50 ml via colostomy)  Height:   Ht Readings from Last 1 Encounters:  09/23/23 5' 10.98" (1.803 m)    Weight:   Wt Readings from Last 1 Encounters:  09/24/23 93.3 kg    Ideal Body Weight:  78.1 kg  BMI:  Body mass index is 28.7 kg/m.  Estimated Nutritional Needs:   Kcal:  1863  Protein:  110-125 grams  Fluid:  1.8-2.0 L    Levada Schilling, RD, LDN, CDCES Registered Dietitian III Certified Diabetes Care and Education Specialist If unable to reach this RD, please use "RD Inpatient" group  chat on secure chat between hours of 8am-4 pm daily

## 2023-09-24 NOTE — Progress Notes (Signed)
NAME:  Calvin [name], MRN:  [id number], DOB:  [birth year], LOS: 21 ADMISSION DATE:  09/03/2023, CONSULTATION DATE:  09/21/2023 REFERRING MD:  Manuela Schwartz, NP, CHIEF COMPLAINT:  Acute Respiratory Distress, Aspiration event   Brief Pt Description / Synopsis:  73 y.o. male admitted with PMHx most significant for colon cancer status post transverse colectomy and colostomy, presented with nausea, vomiting, and shortness of breath.  Was admitted with Sepsis due to Influenza A infection, Aspiration pneumonia, and Small Bowel Obstruction.  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  Course further complicated by Aspiration event requiring intubation and mechanical ventilation on 1/17.  History of Present Illness:  Calvin [name] is a 73 y.o. 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   Was admitted by [facility] for further workup and treatment of Was admitted with Sepsis due to Influenza A infection, Aspiration pneumonia, and Small Bowel Obstruction.  Please see "Significant Hospital Events" section below for full detailed hospital course.  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 side (HCC)    Hypertension    Myocardial infarction (HCC)    Neuromuscular disorder (HCC)     Ogilvie syndrome    Seizures (HCC)    last 6 months ago   Stroke [facility])    multiple  left side weakness   Tremors of nervous system    Unstable angina (HCC)      Micro Data:  12/30: SARS-CoV-2/RSV/Flu PCR>>+ Influenza A 12/30: Blood cultures x2>>negative  12/30: Urine>>Staphylococcus Aureus 12/30: MRSA PCR + 1/17: Tracheal aspirate>>moderate pseudomonas  1/18: MRSA PCR +  Anti-infectives (From admission, onward)    Start     Dose/Rate Route Frequency Ordered Stop   09/23/23 1100  vancomycin (VANCOCIN) IVPB 1000 mg/200 mL premix        1,000 mg 200 mL/hr over 60 Minutes Intravenous Every 12 hours 09/23/23 0811     09/22/23 2300  vancomycin (VANCOCIN) IVPB 1000 mg/200 mL premix  Status:  Discontinued        1,000 mg 200 mL/hr over 60 Minutes Intravenous Every 24 hours 09/22/23 0819 09/23/23 0811   09/22/23 1000  vancomycin (VANCOREADY) IVPB 1500 mg/300 mL        1,500 mg 150 mL/hr over 120 Minutes Intravenous  Once 09/22/23 0819 09/22/23 1916   09/21/23 0815  piperacillin-tazobactam (ZOSYN) IVPB 3.375 g        3.375 g 12.5 mL/hr over 240 Minutes Intravenous Every 8 hours 09/21/23 0729     09/11/23 2000  cefoTEtan (CEFOTAN) 2 g in sodium chloride 0.9 % 100 mL IVPB        2 g 200 mL/hr over 30 Minutes Intravenous Every 8 hours 09/11/23 1801 09/12/23 1910   09/11/23 0600  cefoTEtan (CEFOTAN) 2  g in sodium chloride 0.9 % 100 mL IVPB        2 g 200 mL/hr over 30 Minutes Intravenous On call to O.R. 09/10/23 0858 09/12/23 1910   09/04/23 1200  Ampicillin-Sulbactam (UNASYN) 3 g in sodium chloride 0.9 % 100 mL IVPB  Status:  Discontinued        3 g 200 mL/hr over 30 Minutes Intravenous Every 6 hours 09/04/23 1052 09/08/23 1537   09/03/23 1800  ceFEPIme (MAXIPIME) 2 g in sodium chloride 0.9 % 100 mL IVPB  Status:  Discontinued        2 g 200 mL/hr over 30 Minutes Intravenous Every 12 hours 09/03/23 0812 09/04/23 0903   09/03/23 1500  metroNIDAZOLE (FLAGYL) IVPB 500 mg  Status:   Discontinued        500 mg 100 mL/hr over 60 Minutes Intravenous Every 12 hours 09/03/23 0758 09/04/23 0903   09/03/23 1000  oseltamivir (TAMIFLU) capsule 30 mg        30 mg Oral 2 times daily 09/03/23 0757 09/08/23 0959   09/03/23 0315  ceFEPIme (MAXIPIME) 2 g in sodium chloride 0.9 % 100 mL IVPB        2 g 200 mL/hr over 30 Minutes Intravenous  Once 09/03/23 0304 09/03/23 0441   09/03/23 0315  metroNIDAZOLE (FLAGYL) IVPB 500 mg        500 mg 100 mL/hr over 60 Minutes Intravenous  Once 09/03/23 0304 09/03/23 0441   09/03/23 0315  vancomycin (VANCOCIN) IVPB 1000 mg/200 mL premix        1,000 mg 200 mL/hr over 60 Minutes Intravenous  Once 09/03/23 0304 09/03/23 0441      Significant Hospital Events: Including procedures, antibiotic start and stop dates in addition to other pertinent events   12/30: admitted to hospitalist sepsis, AKI, (+)Influenza A, likely aspiration pneumonia, acute hypoxic resp fail, SBO. NG placed, surgical consult.  12/31-01/06: relatively stable, remains SBO w/ NG and on TPN, worsening colostomy prolapse. Anemia w/o overt GIB, holding anti-plt/anti-cogs, GI no plans for endoscopic eval at this time 01/07: exlap, LOA, and colostomy revision  01/08: stable.  01/09: d/c NG 01/10: advancing diet as able. Hgb 6.8 - 1 unit PRBC given  01/11: persistent abd pain and cough, imaging concerning for abdominal surgical wound dehiscence deeper layers, going back to OR. Continue w/ cough suppression, await over-read but no obvious CT chest findings, suspect post-viral bronchitis  01/12: to OR early AM for closure of wound dehiscence. Postop stable, NG in place. CT chest question pneumonia but pt reports coughing improved, suspect was mostly reflux, will continue to monitor cough. Postop care per general surgery include clamp NG and remain NPO for now   01/13: NG out, CLD 01/14: FLD, continue advancing diet tolerated  1/15: Advanced to dysphagia diet after ST eval.  Discontinue  telemetry, palliative care consult, DC central line 1/16: Underwent CT abdomen/pelvis last evening.  Not a candidate for another surgery due to poor functional and nutritional status and multiple recent procedures.  Family deciding on goals of care/hospice 1/17: Aspirated this morning with development of severe respiratory distress and hypoxia requiring emergent intubation.  Now with developing septic shock.  Pt's daughter requesting transfer to [facility] or [facility].  Dr. Aleen Campi spoke both facilities which are at capacity and not accepting transfers. 1/18: Pt remains mechanically intubated overnight required increased dose of fentanyl and vecuronium x1 dose due to vent dyssynchrony.  Pt currently unable to follow commands RASS goal -4.  Will  perform WUA and for possible SBT.  Pt requiring levophed gtt @5  mcg/min and vasopressin 0.03 units/min to maintain map 65 or higher.  Pt extubated to Bipap able to follow commands  1/19: Pt with increased work of breathing due to increased secretions despite scheduled hypertonic saline, Chest PT, and Bipap requiring reintubation  1/20: On minimal vent settings, will keep intubated today as required reintubation yesterday.  Off Vasopressors. KUB with improving ileus, General Surgery ok with starting trickle feeds.  Interim History / Subjective:  As outlined above under significant events   Objective   Blood pressure 102/63, pulse 65, temperature (!) 96.2 F (35.7 C), temperature source Axillary, resp. rate (!) 27, height 5' 10.98" (1.803 m), weight 93.3 kg, SpO2 100%.    Vent Mode: PRVC FiO2 (%):  [40 %] 40 % Set Rate:  [18 bmp] 18 bmp Vt Set:  [500 mL] 500 mL PEEP:  [5 cmH20] 5 cmH20 Plateau Pressure:  [19 cmH20-26 cmH20] 26 cmH20   Intake/Output Summary (Last 24 hours) at 09/24/2023 6237 Last data filed at 09/24/2023 0700 Gross per 24 hour  Intake 2318.74 ml  Output 2200 ml  Net 118.74 ml   Filed Weights   09/20/23 0500 09/23/23 0400 09/24/23 0457  Weight:  92.2 kg 93.3 kg 93.3 kg   Examination: General: Acute on chronically-ill appearing male, NAD mechanicially intubated  HENT: Atraumatic, normocephalic, neck supple, orally intubated Lungs: Coarse  throughout, even, overbreathing the vent  Cardiovascular: NSR, s1s2, no m/r/g, 2+ radial/1+ distal pulses, no edema Abdomen: Soft, BS present x4, non distended, colostomy and mucous fistula in left upper quadrant dressing dry/intact, stool in bag dressing dry/intact Extremities: Normal bulk and tone, no deformities Neuro: Sedated, currently not following commands. But with purposeful movements as he is reaching for ETT. withdrawing from painful stimulation, PERRL GU: Indwelling foley catheter in place draining yellow urine  Resolved Hospital Problem list     Assessment & Plan:   #Acute metabolic encephalopathy #Mechanical intubation pain/discomfort  Hx: Seizures  - Maintain a RASS goal of 0 to -1 - PAD protocol to maintain RASS goal: propofol gtt and prn dilaudid  - Avoid sedating medications as able - Continue scheduled baclofen  - Daily wake up assessment - Continue lamictal and keppra - Seizure precautions   #Acute hypoxic & hypercapnic respiratory failure #Large volume aspiration #COPD without acute exacerbation #Influenza A infection (present on admission) - Full vent support for now  - Continue lung protective strategies - Maintain plateau pressures less than 30 cm H20 - Wean FiO2 & PEEP as tolerated to maintain O2 sats >92% - Follow intermittent Chest X-ray & ABG as needed - SBT once respiratory parameters met and mental status permits - Implement VAP Bundle - Scheduled and prn bronchodilator therapy  - ABX as above  #Shock: suspect septic +/- hypovolemic ~ RESOLVED #Chronic HFpEF Echocardiogram 09/22/23: EF 50%; left ventricle demonstrates global hypokinesis; mildly elevated pulmonary artery systolic pressure; large pleural effusion in the left lateral region; trivial  mitral valve regurgitation; trivial aortic valve regurgitation  - Continuous telemetry monitoring - Trend lactic acid until normalized - Troponin peaked at 25 - Diuresis as BP and renal function permits  #AKI on CKD stage II~RESOLVED #BPH -Monitor I&O's / urinary output -Follow BMP -Ensure adequate renal perfusion -Avoid nephrotoxic agents as able -Replace electrolytes as indicated ~ Pharmacy following for assistance with electrolyte replacement  #Aspiration pneumonia #Pseudomonas pneumonia  #Influenza Infection (POA)~TREATED -Monitor fever curve -Trend WBC's  -Follow cultures as above -Continue empiric  Zosyn pending cultures & sensitivities  #Small bowel obstruction vs ileus #Colostomy irrigation/prolapse #Hx: ogilvie syndrome s/p colostomy - NPO; OGT to LIS - General Surgery following, appreciate input: continue TPN per recommendations  - KUB with improved ileus on 1/20 ~ Surgery ok with trial trickle feeds ~ Dietician consulted  #Anemia - Trend CBC  - Monitor for s/sx of bleeding  - Transfuse for hgb <7   #Prediabetes -CBG's q4h; Target range of 140 to 180 -SSI -Follow ICU Hypo/Hyperglycemia protocol    Patient is critically ill with shock and multiorgan failure.  Prognosis is guarded, high risk for further decompensation, cardiac arrest and death.  Given current critical illness superimposed on multiple chronic comorbidities including colon cancer, along with advanced age, overall long-term prognosis is poor.  Recommend consideration for DNR status.  Palliative care is following for ongoing goals of care discussions.  Best Practice (right click and "Reselect all SmartList Selections" daily)   Diet/type: NPO; TPN, start trickle feeds DVT prophylaxis: Heparin SQ GI prophylaxis: H2B and PPI Lines: Central line and yes and it is still needed (PICC) Foley:  Yes, and it is still needed Code Status:  full code Last date of multidisciplinary goals of care discussion  [09/23/22]  1/20: Will update pt's daughter  Labs   CBC: Recent Labs  Lab 09/20/23 0615 09/21/23 0746 09/22/23 0526 09/23/23 0411 09/24/23 0436  WBC 11.8* 16.8* 21.8* 16.9* 18.0*  HGB 9.6* 10.6* 8.9* 8.0* 7.7*  HCT 31.2* 35.6* 30.1* 26.5* 25.4*  MCV 93.7 94.7 97.4 95.7 93.7  PLT 264 354 249 212 248    Basic Metabolic Panel: Recent Labs  Lab 09/20/23 0615 09/21/23 0746 09/22/23 0526 09/22/23 0958 09/23/23 0411 09/24/23 0436  NA 138 140 138  --  137 140  K 4.2 4.8 4.8  --  4.3 3.7  CL 102 105 104  --  104 108  CO2 25 23 24   --  23 23  GLUCOSE 105* 138* 132*  --  182* 133*  BUN 13 19 17   --  18 29*  CREATININE 1.16 1.75* 1.35*  --  0.97 0.95  CALCIUM 8.3* 8.1* 7.5*  --  7.6* 8.1*  MG  --   --   --  1.6* 2.4 2.4  PHOS  --   --  4.4  --  2.5 1.7*  1.7*   GFR: Estimated Creatinine Clearance: 79.6 mL/min (by C-G formula based on SCr of 0.95 mg/dL). Recent Labs  Lab 09/21/23 0746 09/21/23 1007 09/22/23 0526 09/22/23 0958 09/23/23 0411 09/24/23 0436  WBC 16.8*  --  21.8*  --  16.9* 18.0*  LATICACIDVEN 1.2 2.0*  --  1.1  --   --     Liver Function Tests: Recent Labs  Lab 09/21/23 0746 09/22/23 0526 09/23/23 0411 09/24/23 0436  AST 38  --   --  12*  ALT 48*  --   --  16  ALKPHOS 133*  --   --  74  BILITOT 0.8  --   --  0.6  PROT 7.0  --   --  6.1*  ALBUMIN 2.6* 2.1* 2.0* 2.0*   No results for input(s): "LIPASE", "AMYLASE" in the last 168 hours. No results for input(s): "AMMONIA" in the last 168 hours.  ABG    Component Value Date/Time   PHART 7.33 (L) 09/23/2023 0946   PCO2ART 53 (H) 09/23/2023 0946   PO2ART 73 (L) 09/23/2023 0946   HCO3 27.9 09/23/2023 0946   ACIDBASEDEF 0.8 09/22/2023 1520  O2SAT 96.2 09/23/2023 0946     Coagulation Profile: No results for input(s): "INR", "PROTIME" in the last 168 hours.  Cardiac Enzymes: No results for input(s): "CKTOTAL", "CKMB", "CKMBINDEX", "TROPONINI" in the last 168 hours.  HbA1C: Hgb A1c MFr Bld   Date/Time Value Ref Range Status  05/28/2022 06:17 PM 5.8 (H) 4.8 - 5.6 % Final    Comment:    (NOTE) Pre diabetes:          5.7%-6.4%  Diabetes:              >6.4%  Glycemic control for   <7.0% adults with diabetes   08/09/2019 07:31 AM 5.7 (H) 4.8 - 5.6 % Final    Comment:    (NOTE) Pre diabetes:          5.7%-6.4% Diabetes:              >6.4% Glycemic control for   <7.0% adults with diabetes     CBG: Recent Labs  Lab 09/23/23 1603 09/23/23 1937 09/23/23 2336 09/24/23 0323 09/24/23 0733  GLUCAP 148* 169* 138* 135* 108*    Review of Systems:   Unable to assess due to Intubation/sedation/critical illness  Past Medical History:  He,  has a past medical history of Alcohol abuse, Anemia, Anxiety, Arthritis, Cancer (HCC), COPD (chronic obstructive pulmonary disease) (HCC), Coronary artery disease, Depression, Gout, Hemiplegia and 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 side (HCC), Hypertension, Myocardial infarction (HCC), Neuromuscular disorder (HCC), Ogilvie syndrome, Seizures (HCC), Stroke (HCC), Tremors of nervous system, and Unstable angina (HCC).   Surgical History:   Past Surgical History:  Procedure Laterality Date   CARPAL TUNNEL RELEASE Left 10/19/2015   Procedure: CARPAL TUNNEL RELEASE;  Surgeon: Kennedy Bucker, MD;  Location: [facility] OR[facility];  Service: Orthopedics;  Laterality: Left;   COLONOSCOPY WITH PROPOFOL N/A 10/26/2021   Procedure: COLONOSCOPY WITH PROPOFOL;  Surgeon: Regis Bill, MD;  Location: [facility];  Service: Endoscopy;  Laterality: N/A;   COLONOSCOPY WITH PROPOFOL N/A 06/02/2022   Procedure: COLONOSCOPY WITH PROPOFOL;  Surgeon: Regis Bill, MD;  Location: [facility];  Service: Endoscopy;  Laterality: N/A;   COLONOSCOPY WITH PROPOFOL N/A 06/06/2022   Procedure: COLONOSCOPY WITH PROPOFOL;  Surgeon: Regis Bill, MD;  Location: [facility];  Service: Endoscopy;  Laterality: N/A;   COLOSTOMY  REVISION N/A 09/11/2023   Procedure: COLOSTOMY REVISION;  Surgeon: Henrene Dodge, MD;  Location: [facility];  Service: General;  Laterality: N/A;   JOINT REPLACEMENT     left partial hip    LAPAROSCOPIC SIGMOID COLECTOMY  09/12/2010   Lap hand assisted sigmoidectomy, mobilization splenic flexure -- Dr. Freida Busman   LAPAROTOMY N/A 09/11/2023   Procedure: EXPLORATORY LAPAROTOMY;  Surgeon: Henrene Dodge, MD;  Location: [facility];  Service: General;  Laterality: N/A;   LAPAROTOMY N/A 09/15/2023   Procedure: EXPLORATORY LAPAROTOMY;  Surgeon: Sung Amabile, DO;  Location: [facility];  Service: General;  Laterality: N/A;   LYSIS OF ADHESION N/A 09/11/2023   Procedure: LYSIS OF ADHESION;  Surgeon: Henrene Dodge, MD;  Location: [facility];  Service: General;  Laterality: N/A;   THROAT SURGERY  09/05/2011   cancer     Social History:   reports that he has quit smoking. His smoking use included cigarettes. He has never used smokeless tobacco. He reports that he does not currently use alcohol. He reports that he does not use drugs.   Family History:  His family history includes Cancer in his mother; Hypertension in his father; Leukemia  in his brother.   Allergies No Known Allergies   Home Medications  Prior to Admission medications   Medication Sig Start Date End Date Taking? Authorizing Provider  aspirin 81 MG chewable tablet Take 81 mg by mouth daily.   Yes [provider]  baclofen (LIORESAL) 5 mg TABS tablet Take 5 mg by mouth 2 (two) times daily.   Yes [provider]  diazePAM, 20 MG Dose, (VALTOCO 20 MG DOSE) 2 x 10 MG/0.1ML LQPK Place 20 mg into the nose as needed (seizure lasting over 2 minutes). 08/23/23  Yes Leroy Sea, MD  ipratropium-albuterol (DUONEB) 0.5-2.5 (3) MG/3ML SOLN Inhale 3 mLs into the lungs every 6 (six) hours as needed. 09/02/23  Yes [provider]  lactulose (CHRONULAC) 10 GM/15ML solution Take 20 g by mouth daily. 08/15/23  Yes [provider]   lamoTRIgine (LAMICTAL) 25 MG tablet Take 50 mg by mouth 2 (two) times daily.   Yes [provider]  levETIRAcetam (KEPPRA) 1000 MG tablet Take 1 tablet (1,000 mg total) by mouth 2 (two) times daily. 08/23/23  Yes Leroy Sea, MD  Melatonin 10 MG TABS Take 10 mg by mouth at bedtime.   Yes [provider]  nitroGLYCERIN (NITROSTAT) 0.4 MG SL tablet Place 0.4 mg under the tongue 3 (three) times daily as needed for chest pain. Every 5 minutes up to 3 doses 08/24/23  Yes [provider]  pantoprazole (PROTONIX) 40 MG tablet Take 1 tablet (40 mg total) by mouth daily. 08/23/23  Yes Leroy Sea, MD  polyethylene glycol (MIRALAX / GLYCOLAX) 17 g packet Take 17 g by mouth 2 (two) times daily. 08/19/23  Yes Wieting, Richard, MD  pregabalin (LYRICA) 100 MG capsule Take 200 mg by mouth at bedtime.   Yes [provider]  senna (SENOKOT) 8.6 MG TABS tablet Take 2 tablets (17.2 mg total) by mouth at bedtime as needed for mild constipation or moderate constipation. 06/26/23  Yes Sunnie Nielsen, DO  tamsulosin (FLOMAX) 0.4 MG CAPS capsule Take 0.4 mg by mouth every evening.   Yes [provider]  divalproex (DEPAKOTE) 125 MG DR tablet Take 4 tablets (500 mg total) by mouth 2 (two) times daily for 1 day, THEN 4 tablets (500 mg total) daily for 3 days, THEN 2 tablets (250 mg total) daily for 3 days, THEN 1 tablet (125 mg total) daily for 3 days. Patient not taking: Reported on 09/03/2023 08/23/23 09/03/23  Leroy Sea, MD  fluticasone [facility]) 50 MCG/ACT nasal spray Place 2 sprays into both nostrils daily. Patient not taking: Reported on 09/03/2023 05/15/23   [provider]  Mouthwashes (MOUTH RINSE) LIQD solution 15 mLs by Mouth Rinse route every 2 (two) hours. Patient not taking: Reported on 09/03/2023 08/19/23   Alford Highland, MD  pregabalin (LYRICA) 50 MG capsule Take 50 mg capsule twice daily and 100 mg (two capsules) at  bedtime Patient not taking: Reported on 09/03/2023 08/23/23   Leroy Sea, MD  tiotropium (SPIRIVA) 18 MCG inhalation capsule Place 18 mcg into inhaler and inhale daily. Patient not taking: Reported on 09/03/2023    [provider]     Critical care time: 40 minutes    Harlon Ditty, AGACNP-BC [facility] Prefer epic messenger for cross cover needs If after hours, please call E-link

## 2023-09-24 NOTE — Progress Notes (Signed)
PHARMACY - TOTAL PARENTERAL NUTRITION CONSULT NOTE   Indication: Prolonged ileus  Patient Measurements: Height: 5' 10.98" (180.3 cm) Weight: 93.3 kg (205 lb 11 oz) IBW/kg (Calculated) : 75.26 TPN AdjBW (KG): 93.1 Body mass index is 28.7 kg/m.  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%, CKD stage II, HTN, colon cancer status post transverse colectomy and colostomy, CVA with chronic left-sided hemiparesis starting on TPN   Glucose / Insulin:  --BG 105 - 169 previous 24h --SSI required: 13 units Electrolytes: Hypophosphatemia Renal: Scr ~ 1 Hepatic: Overall normal Intake / Output: Net negative 5L GI Imaging: 09/21/23 Abdominal x-ray morning shows multilobar bilateral pneumonia  GI Surgeries / Procedures:  09/16/23 expiratory laparotomy with lysis of adhesion and creation of new colostomy   Central access: 09/22/23  TPN start date:  09/22/23   RD Assessment: Estimated Needs Total Energy Estimated Needs: 2200-2400 Total Protein Estimated Needs: 110-125 grams Total Fluid Estimated Needs: >2.0 L  Current Nutrition:  Tube feeding (starting trickle feeds 1/20)  Plan:  --Continue Clinimix E 8/10 TPN to 84 mL/hr (2 L/day) at 1800 No lipids since on propofol Starting trickle feeds 1/20, hopefully can start weaning TPN if tolerates --Electrolytes in TPN: Na 35.61mEq/L, K 30.1mEq/L, Ca 4.54mEq/L, Mg 49mEq/L, and Phos 13mmol/L. Cl:Ac 0.49 Give Phos-Nak 1 packet per tube x 3 doses today for hypophosphatemia secondary to re-feeding --Add standard MVI  to TPN (trace elements on hold ISO shortage) --Continue Moderate q4h SSI and adjust as needed  --Monitor TPN labs on Mon/Thurs, daily until stble  Calvin [name] 09/24/2023,8:57 AM

## 2023-09-24 NOTE — Progress Notes (Signed)
PT Cancellation Note  Patient Details Name: Calvin [name] MRN: [id number] DOB: [birth year]   Cancelled Treatment:    Reason Eval/Treat Not Completed: Medical issues which prohibited therapy (Per chart review, patient transferred to CCU due to changei n medical status; now intubated/sedated.  Will complete orders at this time; please re-consult as medically appropriate.)   Khaleb Broz H. Manson Passey, PT, DPT, NCS 09/24/23, 9:46 AM [phone]

## 2023-09-24 NOTE — IPAL (Signed)
  Interdisciplinary Goals of Care Family Meeting   Date carried out: 09/24/2023  Location of the meeting: Phone conference  Member's involved: Physician, Nurse Practitioner, and Family Member or next of kin    GOALS OF CARE DISCUSSION  The Clinical status was relayed to family in detail-Daughter [place]  Updated and notified of patients medical condition- Patient remains unresponsive and will not open eyes to command.   Patient is having a weak cough and struggling to remove secretions.   Patient with increased WOB and using accessory muscles to breathe Explained to family course of therapy and the modalities   Patient with Progressive multiorgan failure with a very high probablity of a very minimal chance of meaningful recovery despite all aggressive and optimal medical therapy.   PATIENT REMAINS FULL CODE  Family understands the situation. Failure to wean from vent Severe resp failure and pneumonia Surgical wound dehiscence Needs artifical support to stay alive Encouraged daughter to witness SAT/SBT   Family are satisfied with Plan of action and management. All questions answered  Additional CC time 35 mins   Parilee Hally Santiago Glad, M.D.  [facility]  Medical Director [facility] Medical Director [facility] Cardio-Pulmonary Department

## 2023-09-24 NOTE — Progress Notes (Addendum)
ADDENDUM 10:55 AM  KUB reviewed and ileus picture appears improved. NGT output low and having ostomy function. I think it is okay to utilize NGT for trickle feeds to start; take it slow. Okay to give PO medications via NGT as well. Would continue TPN as well for now.    [facility] SURGICAL PROGRESS NOTE  Hospital Day(s): 21.   Post op day(s): 9 Days Post-Op.   Interval History:  Patient seen and examined Remains intubated; failed extubation over weekend He is off vasopressors this morning\ Patient intubated; sedated Leukocytosis to 18.0K Hgb to 7.7 Renal function normal; sCr - 0.95; UO - 1950 ccs Hypophosphatemia to 1.7 Colostomy with gas and stool in bag; 50 ccs + unmeasured  NGT in place; had about 250 ccs out  TPN Vancomycin and Zosyn  Vital signs in last 24 hours: [min-max] current  Temp:  [96.2 F (35.7 C)-97.7 F (36.5 C)] 96.2 F (35.7 C) (01/20 0400) Pulse Rate:  [33-120] 64 (01/20 0700) Resp:  [13-31] 30 (01/20 0700) BP: (83-146)/(55-103) 102/63 (01/20 0700) SpO2:  [95 %-100 %] 100 % (01/20 0700) FiO2 (%):  [40 %] 40 % (01/20 0335) Weight:  [93.3 kg] 93.3 kg (01/20 0457)     Height: 5' 10.98" (180.3 cm) Weight: 93.3 kg BMI (Calculated): 28.7   Intake/Output last 2 shifts:  01/19 0701 - 01/20 0700 In: 2377.7 [I.V.:1823; IV Piggyback:554.7] Out: 2250 [Urine:1950; Emesis/NG output:250; Stool:50]   Physical Exam:  Constitutional: Intubated; sedated HEENT: NGT in place Respiratory: Intubated; on ventilator Cardiovascular: irregularly irregular rhythm; regular rate at 60 bpm this AM Gastrointestinal: soft, non-distended 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, lots of gas and stool in bag.  Genitourinary: Catheter in place  Integumentary: Laparotomy is CDI with staples and retention sutures, serous drainage present on dressing; changed  Labs:     Latest Ref Rng & Units 09/24/2023    4:36 AM 09/23/2023    4:11 AM 09/22/2023    5:26 AM  CBC  WBC  4.0 - 10.5 K/uL 18.0  16.9  21.8   Hemoglobin 13.0 - 17.0 g/dL 7.7  8.0  8.9   Hematocrit 39.0 - 52.0 % 25.4  26.5  30.1   Platelets 150 - 400 K/uL 248  212  249       Latest Ref Rng & Units 09/24/2023    4:36 AM 09/23/2023    4:11 AM 09/22/2023    5:26 AM  CMP  Glucose 70 - 99 mg/dL 191  478  295   BUN 8 - 23 mg/dL 29  18  17    Creatinine 0.61 - 1.24 mg/dL 6.21  3.08  6.57   Sodium 135 - 145 mmol/L 140  137  138   Potassium 3.5 - 5.1 mmol/L 3.7  4.3  4.8   Chloride 98 - 111 mmol/L 108  104  104   CO2 22 - 32 mmol/L 23  23  24    Calcium 8.9 - 10.3 mg/dL 8.1  7.6  7.5   Total Protein 6.5 - 8.1 g/dL 6.1     Total Bilirubin 0.0 - 1.2 mg/dL 0.6     Alkaline Phos 38 - 126 U/L 74     AST 15 - 41 U/L 12     ALT 0 - 44 U/L 16       Imaging studies: No new pertinent imaging studies   Assessment/Plan: 73 y.o. male with continued respiratory failure requiring intubation 9 Days Post-Op closure of wound dehiscence with rentention  sutures s/p initial exploratory laparotomy, LOA, and colostomy revision on 01/07   - In regards to his likely partial fascial dehiscence, we will continue to manage this conservatively. He has undergone two major abdominal operations in the last 14 days and the first being complicated by complete dehiscence requiring takeback. Unfortunately had aspiration event on 01/17, and he is now critically ill, in the ICU, and in need of ventilator support. Certainly malnourished as well with poor functional status at baseline prior to surgeries. Returning to the OR would certainly carry significantly increased risk for peri-operative morbidity/mortality and need for more complex procedures.    - Appreciate PCCM support; ventilator management per their service  - Agree with NGT decompression; monitor and record output; LIS  - Would continue TPN for now; we may be able to consider enteric feedings if ileus picture from 01/17 is improving  - Monitor abdominal examination  - If  sedated/intubated, we can hold on abdominal binder as he is relaxed now  - Wound care: Cover with superficial gauze and ABD pad as needed. Change daily and as needed - anticipate heavy serous output from this, change as needed   - Appreciate WOC RN care with ostomy  - Retention sutures will need to be maintained for 4-6 weeks - Monitor colostomy output; record - Pain control prn; antiemetics prn - Further management per primary service; we will follow   All of the above findings and recommendations were discussed with the medical team.   -- Lynden Oxford, PA-C [facility] 09/24/2023, 7:21 AM M-F: 7am - 4pm

## 2023-09-25 DIAGNOSIS — J9601 Acute respiratory failure with hypoxia: Secondary | ICD-10-CM | POA: Diagnosis not present

## 2023-09-25 DIAGNOSIS — Z7189 Other specified counseling: Secondary | ICD-10-CM | POA: Diagnosis not present

## 2023-09-25 LAB — GLUCOSE, CAPILLARY
Glucose-Capillary: 118 mg/dL — ABNORMAL HIGH (ref 70–99)
Glucose-Capillary: 119 mg/dL — ABNORMAL HIGH (ref 70–99)
Glucose-Capillary: 120 mg/dL — ABNORMAL HIGH (ref 70–99)
Glucose-Capillary: 123 mg/dL — ABNORMAL HIGH (ref 70–99)
Glucose-Capillary: 140 mg/dL — ABNORMAL HIGH (ref 70–99)
Glucose-Capillary: 84 mg/dL (ref 70–99)

## 2023-09-25 LAB — CBC
HCT: 26.8 % — ABNORMAL LOW (ref 39.0–52.0)
Hemoglobin: 7.9 g/dL — ABNORMAL LOW (ref 13.0–17.0)
MCH: 27.5 pg (ref 26.0–34.0)
MCHC: 29.5 g/dL — ABNORMAL LOW (ref 30.0–36.0)
MCV: 93.4 fL (ref 80.0–100.0)
Platelets: 267 10*3/uL (ref 150–400)
RBC: 2.87 MIL/uL — ABNORMAL LOW (ref 4.22–5.81)
RDW: 15 % (ref 11.5–15.5)
WBC: 15.3 10*3/uL — ABNORMAL HIGH (ref 4.0–10.5)
nRBC: 1 % — ABNORMAL HIGH (ref 0.0–0.2)

## 2023-09-25 LAB — RENAL FUNCTION PANEL
Albumin: 1.8 g/dL — ABNORMAL LOW (ref 3.5–5.0)
Anion gap: 8 (ref 5–15)
BUN: 41 mg/dL — ABNORMAL HIGH (ref 8–23)
CO2: 20 mmol/L — ABNORMAL LOW (ref 22–32)
Calcium: 8 mg/dL — ABNORMAL LOW (ref 8.9–10.3)
Chloride: 112 mmol/L — ABNORMAL HIGH (ref 98–111)
Creatinine, Ser: 0.93 mg/dL (ref 0.61–1.24)
GFR, Estimated: >60 mL/min (ref >60)
Glucose, Bld: 138 mg/dL — ABNORMAL HIGH (ref 70–99)
Phosphorus: 3.2 mg/dL (ref 2.5–4.6)
Potassium: 3.9 mmol/L (ref 3.5–5.1)
Sodium: 140 mmol/L (ref 135–145)

## 2023-09-25 LAB — MAGNESIUM
Magnesium: 2.1 mg/dL (ref 1.7–2.4)
Magnesium: 2.1 mg/dL (ref 1.7–2.4)

## 2023-09-25 LAB — PHOSPHORUS: Phosphorus: 5.3 mg/dL — ABNORMAL HIGH (ref 2.5–4.6)

## 2023-09-25 MED ORDER — TRACE MINERALS CU-MN-SE-ZN 300-55-60-3000 MCG/ML IV SOLN: INTRAVENOUS | Status: DC

## 2023-09-25 MED ORDER — INSULIN ASPART 100 UNIT/ML IJ SOLN
0.0000 [IU] | Freq: Four times a day (QID) | INTRAMUSCULAR | Status: DC
Start: 2023-09-25 — End: 2023-09-26
  Administered 2023-09-26: 3 [IU] via SUBCUTANEOUS
  Administered 2023-09-26: 2 [IU] via SUBCUTANEOUS
  Administered 2023-09-26: 3 [IU] via SUBCUTANEOUS
  Administered 2023-09-26: 2 [IU] via SUBCUTANEOUS
  Filled 2023-09-25 (×4): qty 1

## 2023-09-25 MED ORDER — VITAL 1.5 CAL PO LIQD
1000.0000 mL | ORAL | Status: DC
  Administered 2023-09-26 – 2023-09-30 (×3): 1000 mL

## 2023-09-25 MED ORDER — MUPIROCIN 2 % EX OINT
1.0000 | TOPICAL_OINTMENT | Freq: Two times a day (BID) | CUTANEOUS | Status: AC
  Administered 2023-09-25 – 2023-09-29 (×10): 1 via NASAL
  Filled 2023-09-25: qty 22

## 2023-09-25 MED ORDER — PROSOURCE TF20 ENFIT COMPATIBL EN LIQD
60.0000 mL | Freq: Two times a day (BID) | ENTERAL | Status: DC
  Administered 2023-09-25 – 2023-10-01 (×9): 60 mL
  Filled 2023-09-25: qty 60

## 2023-09-25 MED ORDER — NOREPINEPHRINE 16 MG/250ML-% IV SOLN
0.0000 ug/min | INTRAVENOUS | Status: DC
  Administered 2023-09-25: 2 ug/min via INTRAVENOUS
  Filled 2023-09-25 (×2): qty 250

## 2023-09-25 MED ORDER — INFUVITE ADULT IV SOLN
INTRAVENOUS | Status: AC
  Filled 2023-09-25: qty 1000

## 2023-09-25 MED ORDER — CHLORHEXIDINE GLUCONATE CLOTH 2 % EX PADS
6.0000 | MEDICATED_PAD | Freq: Every day | CUTANEOUS | Status: DC
  Administered 2023-09-26 – 2023-09-27 (×2): 6 via TOPICAL

## 2023-09-25 NOTE — Progress Notes (Addendum)
Nutrition Follow-up  DOCUMENTATION CODES:   Not applicable  INTERVENTION:   -TPN management per pharmacy -TF via OGT:    Continue Vital 1.5 @ 20 ml/hr and increase by 10 ml every 12 hours to goal rate of 50 ml/hr.    60 ml Prosource TF BID   30 ml free water flush every 4 hours    Tube feeding regimen provides 1960 kcal (100% of needs), 121 grams of protein, and 917 ml of H2O. Total free water: 1097 ml daily  NUTRITION DIAGNOSIS:   Inadequate oral intake related to altered GI function as evidenced by NPO status.  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  12/31- s/p BSE- plan for dysphagia 1 diet with nectar thick liquids when cleared for diet per SLP 1/1- NGT placed (per KUB on 09/05/23 revealed tip of tube in stomach)  1/3- PICC placed, TPN 1/4- NGT fell out and replaced 1/6- NGT pulled again 1/7- s/p Exploratory Laparotomy, lysis of adhesions, revision of loop colostomy into end colostomy with mucous fistula 1/9- NGT d/c, advanced to clear liquid diet 1/10- advanced to full liquids 1/11- TPN d/c 1/12- s/p closure of wound dehiscence  1/13- advanced to clear liquid diet, NGT d/c 1/14- advanced to full liquid diet 1/15- s/p BSE- advanced to dysphagia 1 diet with nectar thick liquids; CT scan revealed fascial dehiscence in 1-2 areas of midline incision  1/17- transferred to ICU due to aspiration event, emergently intubated 1/18- TPN initiated,  extubated 1/19- re-intubated  1/20- trickle TF initiated  Patient is currently intubated on ventilator support. Per KUB on 09/24/23, OGT tip in stomach.  MV: 16.4 L/min Temp (24hrs), Avg:98.4 F (36.9 C), Min:97.7 F (36.5 C), Max:98.9 F (37.2 C)  Propofol: 28 ml/hr (provides 739 kcals daily)  Reviewed I/O's: -189 ml x 24 hours and -2.9 L since 09/11/23  UOP: 1 L x 24 hours  Colostomy output: 800 ml x 24 hours  MAP: 75  Case discussed with RN. TF paused secondary to SBT. Pt was tolerating TF well overnight. Per RN, pt with tachycardia and colostomy output. No plans for further surgery at this time.   Case discussed with PCCM, general surgery, and RN; plan to slowly advance TF today.   Pt remains on TPN, which is currently infusing at goal rate of 84 ml/hr, which provides 1331 kcals and 161 grams protein, which meets 71% of estimated kcal needs and 100% of estimated protein needs. Per pharmacy, plan to reduce TPN to 42 ml/hr at 1800, which provides 887 kcals and 50 grams protein, meeting 41% if estimated kcal needs and 45% of estimated protein needs.   Wt has been stable over the past week.   Medications reviewed and include pulmicort, colace, lovenox, lactulose, proronix, miralax, keppra, and propofol.   Labs reviewed: CBGS: 84-123 (inpatient orders for glycemic control are 0-15 units insulin aspart every 6 hours).    Diet Order:   Diet Order  Diet NPO time specified  Diet effective now                   EDUCATION NEEDS:   Not appropriate for education at this time  Skin:  Skin Assessment: Skin Integrity Issues: Skin Integrity Issues:: Incisions Unstageable: rt and lt heels Incisions: closed abdomen  Last BM:  09/25/23 (250 ml via colostomy)  Height:   Ht Readings from Last 1 Encounters:  09/23/23 5' 10.98" (1.803 m)    Weight:   Wt Readings from Last 1 Encounters:  09/24/23 93.3 kg    Ideal Body Weight:  78.1 kg  BMI:  Body mass index is  28.7 kg/m.  Estimated Nutritional Needs:   Kcal:  2141  Protein:  110-125 grams  Fluid:  1.8-2.0 L    Calvin [name], RD, LDN, CDCES Registered Dietitian III Certified Diabetes Care and Education Specialist If unable to reach this RD, please use "RD Inpatient" group chat on secure chat between hours of 8am-4 pm daily

## 2023-09-25 NOTE — Progress Notes (Signed)
Daily Progress Note   [name]       Date: 09/25/2023 DOB: April 08, 1951  Age: 73 y.o. MRN#: [id number] Attending Physician: Erin Fulling, MD Primary Care Physician: Keane Police, MD Admit Date: 09/03/2023  Reason for Consultation/Follow-up: Establishing goals of care  Subjective: Notes and labs reviewed.  Updated by staff.  In to see patient. He is currently resting in bed on vent support.  No family at bedside.  Stepped out and called daughter [name].  [name] states she was here for her father's SBT and he did not handle this well.  She states the medical team is recommending tracheostomy, but they are still deciding if this is something that he would require.    She states she understands that he is extremely sick, but sees a small window of hope for his life to continue.  We discussed pain, suffering, and quality of life prior to this admission, now, and moving forward.  She states she does not want her father to suffer, and will not leave him in a permanent state of suffering, but would want to give him every opportunity to improve.  She states she knows some of her patients who have tracheostomy.  Discussed the difference in requiring a ventilator to sustain your breathing, versus simply requiring a tracheostomy stoma where a Passy-Muir valve could be fitted.  She states if his heart stops she would want CPR as patient indicated that this is something that he would want, during a recent hospitalization.  She states  that ultimately she understands that he may die at any time.  She states this is up to God, and she does not feel it is time to shift to comfort care.  PMT will continue to follow.  Length of Stay: 22  Current Medications: Scheduled Meds:    acetaminophen  650 mg Per Tube Q6H   baclofen  5 mg Per Tube BID   budesonide (PULMICORT) nebulizer solution  0.25 mg Nebulization BID   Chlorhexidine Gluconate Cloth  6 each Topical Daily   [START ON 09/26/2023] Chlorhexidine Gluconate Cloth  6 each Topical Q0600   docusate  100 mg Per Tube BID   enoxaparin (LOVENOX) injection  40 mg Subcutaneous QHS   feeding supplement (PROSource TF20)  60 mL Per Tube BID   free water  30 mL Per Tube Q4H   insulin aspart  0-15 Units Subcutaneous Q6H   ipratropium-albuterol  3 mL Inhalation TID   lactulose  20 g Per Tube Daily   lamoTRIgine  50 mg Per Tube BID   mupirocin ointment  1 Application Nasal BID   mouth rinse  15 mL Mouth Rinse Q2H   pantoprazole (PROTONIX) IV  40 mg Intravenous Q12H   polyethylene glycol  17 g Per Tube BID   pregabalin  100 mg Per Tube Daily   pregabalin  200 mg Per Tube QHS   sodium chloride flush  10-40 mL Intracatheter Q12H   sodium chloride flush  3-10 mL Intravenous Q12H   tamsulosin  0.4 mg Oral QPM    Continuous Infusions:  feeding supplement (VITAL 1.5 CAL)     levETIRAcetam Stopped (09/25/23 0954)   norepinephrine (LEVOPHED) Adult infusion     piperacillin-tazobactam (ZOSYN)  IV 3.375 g (09/25/23 1034)   propofol (DIPRIVAN) infusion 50 mcg/kg/min (09/25/23 1203)   TPN (CLINIMIX) Adult without lytes     TPN (CLINIMIX-E) Adult 84 mL/hr at 09/25/23 1027    PRN Meds: HYDROmorphone (DILAUDID) injection, ipratropium-albuterol, midazolam, mouth rinse, senna, sodium chloride flush, sodium chloride flush  Physical Exam Constitutional:      Comments: Eyes closed  Pulmonary:     Comments: On ventilator            Vital Signs: BP (!) 115/59 (BP Location: Left Arm)   Pulse 84   Temp 97.7 F (36.5 C) (Axillary)   Resp (!) 22   Ht 5' 10.98" (1.803 m)   Wt 93.3 kg   SpO2 100%   BMI 28.70 kg/m  SpO2: SpO2: 100 % O2 Device: O2 Device: Ventilator O2 Flow Rate: O2 Flow Rate (L/min): 2 L/min  Intake/output  summary:  Intake/Output Summary (Last 24 hours) at 09/25/2023 1318 Last data filed at 09/25/2023 1027 Gross per 24 hour  Intake 3126.79 ml  Output 1325 ml  Net 1801.79 ml   LBM: Last BM Date : 09/22/23 (ostomy) Baseline Weight: Weight: 97.8 kg Most recent weight: Weight: 93.3 kg    Patient Active Problem List   Diagnosis Date Noted   Aspiration into airway 09/21/2023   Small bowel obstruction due to adhesions (HCC) 09/11/2023   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   Cancer of sigmoid (HCC) 09/04/2023   Gout 09/04/2023   Acute respiratory failure with hypoxia (HCC) 09/03/2023   Acute metabolic encephalopathy 08/19/2023   History of CVA (cerebrovascular accident) 08/19/2023   Thrombocytopenia (HCC) 08/19/2023   Hypotension 07/15/2023   Small bowel obstruction (HCC) 07/15/2023   Hemiplegia and 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 side (HCC)    Temporal pain 06/18/2022   Acute kidney injury superimposed on CKD (HCC) 06/17/2022   Dysphagia 06/07/2022   Respiratory distress 06/02/2022   Neuropathy 06/02/2022   Paroxysmal atrial flutter (HCC) 06/01/2022   Aspiration pneumonia (HCC) 06/01/2022   Cervical spinal stenosis 05/31/2022   Chronic diastolic CHF (congestive heart failure) (HCC) 05/28/2022   SBO, recurrent (small bowel obstruction) (HCC)    Abdominal distention    HLD (hyperlipidemia) 10/16/2021   Nausea vomiting and diarrhea 10/16/2021   Sepsis (HCC) 10/16/2021   Tobacco abuse 10/16/2021   Muscle twitching 08/14/2019   UTI (urinary tract infection) 08/03/2019   Ileus (HCC) 08/03/2019   Hypokalemia 08/03/2019   QT prolongation 08/03/2019   Ogilvie syndrome    Acute abdominal pain 04/03/2019   Left-sided weakness 09/30/2017   Seizure (HCC) 09/30/2017  Essential hypertension 09/30/2017   CAD (coronary artery disease) 09/30/2017   COPD (chronic obstructive pulmonary disease) (HCC) 09/30/2017   Depression with anxiety 09/30/2017     Palliative Care Assessment & Plan     Recommendations/Plan: Continue care  Code Status:    Code Status Orders  (From admission, onward)           Start     Ordered   09/03/23 0754  Full code  Continuous       Question:  By:  Answer:  Consent: discussion documented in EHR   09/03/23 0755           Code Status History     Date Active Date Inactive Code Status Order ID Comments User Context   08/19/2023 1314 08/23/2023 1838 Full Code [id number]  Alan Mulder, MD Inpatient   08/18/2023 1448 08/19/2023 1154 Full Code [id number]  Emeline General, MD ED   07/18/2023 1748 07/24/2023 1712 Full Code [id number]  Kathrynn Running, MD Inpatient   07/15/2023 0538 07/18/2023 1551 Full Code [id number]  Andris Baumann, MD ED   06/22/2023 0939 06/27/2023 0108 Full Code [id number]  Emeline General, MD ED   05/28/2022 1622 06/30/2022 2326 Full Code [id number]  Lorretta Harp, MD ED   10/23/2021 2158 11/02/2021 2029 Full Code [id number]  Lajoyce Corners, NP Inpatient   10/16/2021 1144 10/23/2021 2158 DNR [id number]  Lorretta Harp, MD ED   10/02/2021 0731 10/02/2021 1641 DNR [id number]  Loleta Rose, MD ED   08/03/2019 2226 08/14/2019 2350 Full Code [id number]  Lurene Shadow, MD Inpatient   04/03/2019 1711 04/08/2019 1731 Full Code [id number]  Auburn Bilberry, MD Inpatient   04/03/2019 1627 04/03/2019 1710 DNR [id number]  Auburn Bilberry, MD ED   09/30/2017 2301 10/02/2017 2039 Full Code [id number]  Oralia Manis, MD Inpatient       Prognosis: Poor  Care plan was discussed with staff  Thank you for allowing the Palliative Medicine Team to assist in the care of this patient.    Morton Stall, NP  Please contact Palliative Medicine Team phone at [phone] for questions and concerns.

## 2023-09-25 NOTE — Progress Notes (Signed)
[facility] SURGICAL PROGRESS NOTE  Hospital Day(s): 22.   Post op day(s): 10 Days Post-Op.   Interval History:  Patient seen and examined Remains intubated He is off vasopressors this morning Leukocytosis is improving; 15.3K this AM Hgb to 7.9; stable  Renal function normal; sCr - 0.93; UO - 1000 ccs Colostomy with gas and stool in bag; 800 ccs  Now on trickle enteric feeds + TPN Zosyn  Vital signs in last 24 hours: [min-max] current  Temp:  [98 F (36.7 C)-98.9 F (37.2 C)] 98.8 F (37.1 C) (01/21 0500) Pulse Rate:  [56-84] 69 (01/21 0600) Resp:  [19-31] 25 (01/21 0600) BP: (86-110)/(52-79) 95/60 (01/21 0600) SpO2:  [97 %-100 %] 98 % (01/21 0600) FiO2 (%):  [40 %] 40 % (01/21 0347)     Height: 5' 10.98" (180.3 cm) Weight: 93.3 kg BMI (Calculated): 28.7   Intake/Output last 2 shifts:  01/20 0701 - 01/21 0700 In: 1610.8 [I.V.:1002.3; NG/GT:396.3; IV Piggyback:212.1] Out: 1800 [Urine:1000; Stool:800]   Physical Exam:  Constitutional: Intubated; sedated HEENT: NGT in place; trickle feeds running at 20 ml/hr Respiratory: Intubated; on ventilator Cardiovascular: regular rate at 60 bpm this AM Gastrointestinal: soft, non-distended 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, lots of gas and stool in bag. Integumentary: Laparotomy is CDI with staples and retention sutures, serous drainage present on dressing; changed  Labs:     Latest Ref Rng & Units 09/25/2023    4:35 AM 09/24/2023    4:36 AM 09/23/2023    4:11 AM  CBC  WBC 4.0 - 10.5 K/uL 15.3  18.0  16.9   Hemoglobin 13.0 - 17.0 g/dL 7.9  7.7  8.0   Hematocrit 39.0 - 52.0 % 26.8  25.4  26.5   Platelets 150 - 400 K/uL 267  248  212       Latest Ref Rng & Units 09/25/2023    4:35 AM 09/24/2023    4:36 AM 09/23/2023    4:11 AM  CMP  Glucose 70 - 99 mg/dL 086  578  469   BUN 8 - 23 mg/dL 41  29  18   Creatinine 0.61 - 1.24 mg/dL 6.29  5.28  4.13   Sodium 135 - 145 mmol/L 140  140  137   Potassium 3.5 - 5.1  mmol/L 3.9  3.7  4.3   Chloride 98 - 111 mmol/L 112  108  104   CO2 22 - 32 mmol/L 20  23  23    Calcium 8.9 - 10.3 mg/dL 8.0  8.1  7.6   Total Protein 6.5 - 8.1 g/dL  6.1    Total Bilirubin 0.0 - 1.2 mg/dL  0.6    Alkaline Phos 38 - 126 U/L  74    AST 15 - 41 U/L  12    ALT 0 - 44 U/L  16      Imaging studies: No new pertinent imaging studies   Assessment/Plan: 73 y.o. male with continued respiratory failure requiring intubation 10 Days Post-Op closure of wound dehiscence with rentention sutures s/p initial exploratory laparotomy, LOA, and colostomy revision on 01/07   - Again, in regards to his likely partial fascial dehiscence, we will continue to manage this conservatively. He has undergone two major abdominal operations in the last 14 days and the first being complicated by complete dehiscence requiring takeback. Unfortunately had aspiration event on 01/17, and he is now critically ill, in the ICU, and in need of ventilator support. Certainly malnourished as well  with poor functional status at baseline prior to surgeries. Returning to the OR would certainly carry significantly increased risk for peri-operative morbidity/mortality and need for more complex procedures.    - Appreciate PCCM support; ventilator management per their service  - Reasonable to slowly advance enteric feeds; monitor tolerance - may need to check residuals every few hours. He was tolerating a diet and having ostomy function but continued to be distended  before aspiration event   - Would continue TPN for now; wean if enteric feedings advance  - Monitor abdominal examination  - If sedated/intubated, we can hold on abdominal binder as he is relaxed now  - Wound care: Cover with superficial gauze and ABD pad as needed. Change daily and as needed - anticipate heavy serous output from this, change as needed   - Appreciate WOC RN care with ostomy  - Retention sutures will need to be maintained for 4-6 weeks - Monitor  colostomy output; record - Pain control prn; antiemetics prn - Further management per primary service; we will follow   All of the above findings and recommendations were discussed with the medical team.   -- Lynden Oxford, PA-C [facility] 09/25/2023, 7:19 AM M-F: 7am - 4pm

## 2023-09-25 NOTE — Progress Notes (Signed)
PHARMACY - TOTAL PARENTERAL NUTRITION CONSULT NOTE   Indication: Prolonged ileus  Patient Measurements: Height: 5' 10.98" (180.3 cm) Weight: 93.3 kg (205 lb 11 oz) IBW/kg (Calculated) : 75.26 TPN AdjBW (KG): 93.1 Body mass index is 28.7 kg/m.  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%, CKD stage II, HTN, colon cancer status post transverse colectomy and colostomy, CVA with chronic left-sided hemiparesis starting on TPN   Glucose / Insulin:  --BG 84 - 123 previous 24h --SSI required: 2 units Electrolytes: Within normal limits Renal: Scr ~ 1, BUN elevated to 41 today Hepatic: Overall normal Intake / Output: Net negative 1.4L GI Imaging: 09/21/23 Abdominal x-ray morning shows multilobar bilateral pneumonia  GI Surgeries / Procedures:  09/16/23 expiratory laparotomy with lysis of adhesion and creation of new colostomy   Central access: 09/22/23  TPN start date:  09/22/23   RD Assessment: Estimated Needs Total Energy Estimated Needs: 1863 Total Protein Estimated Needs: 110-125 grams Total Fluid Estimated Needs: 1.8-2.0 L  Current Nutrition:  Tube feeding (starting trickle feeds 1/20)  Plan:  --Modify to Clinimix 5/20 TPN at 42 mL/hr (1 L/day) at 1800 No lipids since on propofol at 28 mL/hr = 739 Kcal from fat per day  Starting trickle feeds 1/20, start weaning TPN as tolerated --Add standard MVI and trace elements to TPN --Decrease to Moderate q6h SSI and adjust as needed --Monitor TPN labs on Mon/Thurs, daily until [employer] 09/25/2023,11:53 AM

## 2023-09-25 NOTE — Progress Notes (Signed)
NAME:  Calvin [name], MRN:  [id number], DOB:  May 13, 1951, LOS: 22 ADMISSION DATE:  09/03/2023, CONSULTATION DATE:  09/21/2023 REFERRING MD:  Manuela Schwartz, NP, CHIEF COMPLAINT:  Acute Respiratory Distress, Aspiration event   Brief Pt Description / Synopsis:  73 y.o. male admitted with PMHx most significant for colon cancer status post transverse colectomy and colostomy, presented with nausea, vomiting, and shortness of breath.  Was admitted with Sepsis due to Influenza A infection, Aspiration pneumonia, and Small Bowel Obstruction.  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  Course further complicated by Aspiration event requiring intubation and mechanical ventilation on 1/17.  History of Present Illness:  Calvin [name] is a 73 y.o. 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   Was admitted by [facility] for further workup and treatment of Was admitted with Sepsis due to Influenza A infection, Aspiration pneumonia, and Small Bowel Obstruction.  Please see "Significant Hospital Events" section below for full detailed hospital course.  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 side (HCC)    Hypertension    Myocardial infarction (HCC)    Neuromuscular disorder (HCC)     Ogilvie syndrome    Seizures (HCC)    last 6 months ago   Stroke [facility])    multiple  left side weakness   Tremors of nervous system    Unstable angina (HCC)      Micro Data:  12/30: SARS-CoV-2/RSV/Flu PCR>>+ Influenza A 12/30: Blood cultures x2>>negative  12/30: Urine>>Staphylococcus Aureus 12/30: MRSA PCR + 1/17: Tracheal aspirate>>moderate pseudomonas  1/18: MRSA PCR +  Anti-infectives (From admission, onward)    Start     Dose/Rate Route Frequency Ordered Stop   09/23/23 1100  vancomycin (VANCOCIN) IVPB 1000 mg/200 mL premix  Status:  Discontinued        1,000 mg 200 mL/hr over 60 Minutes Intravenous Every 12 hours 09/23/23 0811 09/24/23 1021   09/22/23 2300  vancomycin (VANCOCIN) IVPB 1000 mg/200 mL premix  Status:  Discontinued        1,000 mg 200 mL/hr over 60 Minutes Intravenous Every 24 hours 09/22/23 0819 09/23/23 0811   09/22/23 1000  vancomycin (VANCOREADY) IVPB 1500 mg/300 mL        1,500 mg 150 mL/hr over 120 Minutes Intravenous  Once 09/22/23 0819 09/22/23 1916   09/21/23 0815  piperacillin-tazobactam (ZOSYN) IVPB 3.375 g        3.375 g 12.5 mL/hr over 240 Minutes Intravenous Every 8 hours 09/21/23 0729 09/27/23 2359   09/11/23 2000  cefoTEtan (CEFOTAN) 2 g in sodium chloride 0.9 % 100 mL IVPB        2 g 200 mL/hr over 30 Minutes Intravenous Every 8 hours 09/11/23 1801 09/12/23 1910   09/11/23 0600  cefoTEtan (CEFOTAN) 2 g in sodium chloride 0.9 % 100 mL IVPB        2 g 200 mL/hr over 30 Minutes Intravenous On call to O.R. 09/10/23 0858 09/12/23 1910   09/04/23 1200  Ampicillin-Sulbactam (UNASYN) 3 g in sodium chloride 0.9 % 100 mL IVPB  Status:  Discontinued        3 g 200 mL/hr over 30 Minutes Intravenous Every 6 hours 09/04/23 1052 09/08/23 1537   09/03/23 1800  ceFEPIme (MAXIPIME) 2 g in sodium chloride 0.9 % 100 mL IVPB  Status:  Discontinued        2 g 200 mL/hr over 30 Minutes Intravenous Every 12 hours 09/03/23 0812 09/04/23 0903   09/03/23 1500   metroNIDAZOLE (FLAGYL) IVPB 500 mg  Status:  Discontinued        500 mg 100 mL/hr over 60 Minutes Intravenous Every 12 hours 09/03/23 0758 09/04/23 0903   09/03/23 1000  oseltamivir (TAMIFLU) capsule 30 mg        30 mg Oral 2 times daily 09/03/23 0757 09/08/23 0959   09/03/23 0315  ceFEPIme (MAXIPIME) 2 g in sodium chloride 0.9 % 100 mL IVPB        2 g 200 mL/hr over 30 Minutes Intravenous  Once 09/03/23 0304 09/03/23 0441   09/03/23 0315  metroNIDAZOLE (FLAGYL) IVPB 500 mg        500 mg 100 mL/hr over 60 Minutes Intravenous  Once 09/03/23 0304 09/03/23 0441   09/03/23 0315  vancomycin (VANCOCIN) IVPB 1000 mg/200 mL premix        1,000 mg 200 mL/hr over 60 Minutes Intravenous  Once 09/03/23 0304 09/03/23 0441      Significant Hospital Events: Including procedures, antibiotic start and stop dates in addition to other pertinent events   12/30: admitted to hospitalist sepsis, AKI, (+)Influenza A, likely aspiration pneumonia, acute hypoxic resp fail, SBO. NG placed, surgical consult.  12/31-01/06: relatively stable, remains SBO w/ NG and on TPN, worsening colostomy prolapse. Anemia w/o overt GIB, holding anti-plt/anti-cogs, GI no plans for endoscopic eval at this time 01/07: exlap, LOA, and colostomy revision  01/08: stable.  01/09: d/c NG 01/10: advancing diet as able. Hgb 6.8 - 1 unit PRBC given  01/11: persistent abd pain and cough, imaging concerning for abdominal surgical wound dehiscence deeper layers, going back to OR. Continue w/ cough suppression, await over-read but no obvious CT chest findings, suspect post-viral bronchitis  01/12: to OR early AM for closure of wound dehiscence. Postop stable, NG in place. CT chest question pneumonia but pt reports coughing improved, suspect was mostly reflux, will continue to monitor cough. Postop care per general surgery include clamp NG and remain NPO for now   01/13: NG out, CLD 01/14: FLD, continue advancing diet tolerated  1/15: Advanced to  dysphagia diet after ST eval.  Discontinue telemetry, palliative care consult, DC central line 1/16: Underwent CT abdomen/pelvis last evening.  Not a candidate for another surgery due to poor functional and nutritional status and multiple recent procedures.  Family deciding on goals of care/hospice 1/17: Aspirated this morning with development of severe respiratory distress and hypoxia requiring emergent intubation.  Now with developing septic shock.  Pt's daughter requesting transfer to [facility] or [facility].  Dr. Aleen Campi spoke both facilities which are at capacity and not accepting transfers. 1/18: Pt remains mechanically intubated overnight required increased dose of fentanyl and vecuronium x1 dose due to vent dyssynchrony.  Pt currently unable to follow commands RASS goal -  4.  Will perform WUA and for possible SBT.  Pt requiring levophed gtt @5  mcg/min and vasopressin 0.03 units/min to maintain map 65 or higher.  Pt extubated to Bipap able to follow commands  1/19: Pt with increased work of breathing due to increased secretions despite scheduled hypertonic saline, Chest PT, and Bipap requiring reintubation  1/20: On minimal vent settings, will keep intubated today as required reintubation yesterday.  Off Vasopressors. KUB with improving ileus, General Surgery ok with starting trickle feeds 1/21: On minimal vent settings.  Daughter at bedside for SBT, FAILED SBT due to tachypnea and increased WOB.  Interim History / Subjective:  As outlined above under significant events   Objective   Blood pressure 95/60, pulse 69, temperature 98.8 F (37.1 C), temperature source Oral, resp. rate (!) 25, height 5' 10.98" (1.803 m), weight 93.3 kg, SpO2 98%.    Vent Mode: PRVC FiO2 (%):  [40 %] 40 % Set Rate:  [18 bmp] 18 bmp Vt Set:  [500 mL] 500 mL PEEP:  [5 cmH20] 5 cmH20 Plateau Pressure:  [21 cmH20-26 cmH20] 21 cmH20   Intake/Output Summary (Last 24 hours) at 09/25/2023 0734 Last data filed at 09/25/2023  0600 Gross per 24 hour  Intake 1610.77 ml  Output 1800 ml  Net -189.23 ml   Filed Weights   09/20/23 0500 09/23/23 0400 09/24/23 0457  Weight: 92.2 kg 93.3 kg 93.3 kg   Examination: General: Acute on chronically-ill appearing male, NAD mechanicially intubated  HENT: Atraumatic, normocephalic, neck supple, orally intubated Lungs: Coarse  throughout, even, overbreathing the vent  Cardiovascular: NSR, s1s2, no m/r/g, 2+ radial/1+ distal pulses, no edema Abdomen: Soft, BS present x4, non distended, colostomy and mucous fistula in left upper quadrant dressing dry/intact, stool in bag dressing dry/intact Extremities: Normal bulk and tone, no deformities Neuro: Sedated, currently not following commands, but with purposeful movements as he is reaching for ETT. withdrawing from painful stimulation, PERRL GU: Indwelling foley catheter in place draining yellow urine  Resolved Hospital Problem list     Assessment & Plan:   #Acute metabolic encephalopathy #Mechanical intubation pain/discomfort  Hx: Seizures  - Maintain a RASS goal of 0 to -1 - PAD protocol to maintain RASS goal: propofol gtt and prn dilaudid  - Avoid sedating medications as able - Continue scheduled baclofen  - Daily wake up assessment - Continue lamictal and keppra - Seizure precautions   #Acute hypoxic & hypercapnic respiratory failure #Large volume aspiration #COPD without acute exacerbation #Influenza A infection (present on admission) - Full vent support for now  - Continue lung protective strategies - Maintain plateau pressures less than 30 cm H20 - Wean FiO2 & PEEP as tolerated to maintain O2 sats >92% - Follow intermittent Chest X-ray & ABG as needed - SBT once respiratory parameters met and mental status permits - Implement VAP Bundle - Scheduled and prn bronchodilator therapy  - ABX as above - May end up needing Tracheostomy  #Shock: suspect septic +/- hypovolemic ~ RESOLVED #Chronic  HFpEF Echocardiogram 09/22/23: EF 50%; left ventricle demonstrates global hypokinesis; mildly elevated pulmonary artery systolic pressure; large pleural effusion in the left lateral region; trivial mitral valve regurgitation; trivial aortic valve regurgitation  - Continuous telemetry monitoring - Trend lactic acid until normalized - Troponin peaked at 25 - Diuresis as BP and renal function permits  #AKI on CKD stage II~RESOLVED #BPH -Monitor I&O's / urinary output -Follow BMP -Ensure adequate renal perfusion -Avoid nephrotoxic agents as able -Replace electrolytes as indicated ~ Pharmacy  following for assistance with electrolyte replacement  #Aspiration pneumonia #Pseudomonas pneumonia  #Influenza Infection (POA)~TREATED -Monitor fever curve -Trend WBC's  -Follow cultures as above -Continue empiric Zosyn pending cultures & sensitivities (plan for 7 day course)  #Small bowel obstruction vs ileus #Colostomy irrigation/prolapse #Hx: ogilvie syndrome s/p colostomy - NPO; OGT to LIS - General Surgery following, appreciate input: continue TPN per recommendations  - KUB with improved ileus on 1/20 ~ Surgery ok with trial trickle feeds and slowly advancing as tolerated ~ Dietician following  #Anemia - Trend CBC  - Monitor for s/sx of bleeding  - Transfuse for hgb <7   #Prediabetes -CBG's q4h; Target range of 140 to 180 -SSI -Follow ICU Hypo/Hyperglycemia protocol    Patient is critically ill with shock and multiorgan failure.  Prognosis is guarded, high risk for further decompensation, cardiac arrest and death.  Given current critical illness superimposed on multiple chronic comorbidities including colon cancer, along with advanced age, overall long-term prognosis is poor.  Recommend consideration for DNR status.  Palliative care is following for ongoing goals of care discussions.  Best Practice (right click and "Reselect all SmartList Selections" daily)   Diet/type: NPO; TPN,  trickle feeds DVT prophylaxis: Heparin SQ GI prophylaxis: H2B and PPI Lines: Central line and yes and it is still needed (PICC for TPN) Foley:  Yes, and it is still needed Code Status:  full code Last date of multidisciplinary goals of care discussion [09/24/22]  1/21:  Pt's daughter updated at bedside on plan of care.  Labs   CBC: Recent Labs  Lab 09/21/23 0746 09/22/23 0526 09/23/23 0411 09/24/23 0436 09/25/23 0435  WBC 16.8* 21.8* 16.9* 18.0* 15.3*  HGB 10.6* 8.9* 8.0* 7.7* 7.9*  HCT 35.6* 30.1* 26.5* 25.4* 26.8*  MCV 94.7 97.4 95.7 93.7 93.4  PLT 354 249 212 248 267    Basic Metabolic Panel: Recent Labs  Lab 09/21/23 0746 09/22/23 0526 09/22/23 0958 09/23/23 0411 09/24/23 0436 09/24/23 1443 09/25/23 0435  NA 140 138  --  137 140  --  140  K 4.8 4.8  --  4.3 3.7  --  3.9  CL 105 104  --  104 108  --  112*  CO2 23 24  --  23 23  --  20*  GLUCOSE 138* 132*  --  182* 133*  --  138*  BUN 19 17  --  18 29*  --  41*  CREATININE 1.75* 1.35*  --  0.97 0.95  --  0.93  CALCIUM 8.1* 7.5*  --  7.6* 8.1*  --  8.0*  MG  --   --  1.6* 2.4 2.4 2.5* 2.1  PHOS  --  4.4  --  2.5 1.7*  1.7* 4.5 3.2   GFR: Estimated Creatinine Clearance: 81.3 mL/min (by C-G formula based on SCr of 0.93 mg/dL). Recent Labs  Lab 09/21/23 0746 09/21/23 1007 09/22/23 0526 09/22/23 0958 09/23/23 0411 09/24/23 0436 09/25/23 0435  WBC 16.8*  --  21.8*  --  16.9* 18.0* 15.3*  LATICACIDVEN 1.2 2.0*  --  1.1  --   --   --     Liver Function Tests: Recent Labs  Lab 09/21/23 0746 09/22/23 0526 09/23/23 0411 09/24/23 0436 09/25/23 0435  AST 38  --   --  12*  --   ALT 48*  --   --  16  --   ALKPHOS 133*  --   --  74  --   BILITOT 0.8  --   --  0.6  --   PROT 7.0  --   --  6.1*  --   ALBUMIN 2.6* 2.1* 2.0* 2.0* 1.8*   No results for input(s): "LIPASE", "AMYLASE" in the last 168 hours. No results for input(s): "AMMONIA" in the last 168 hours.  ABG    Component Value Date/Time   PHART  7.33 (L) 09/23/2023 0946   PCO2ART 53 (H) 09/23/2023 0946   PO2ART 73 (L) 09/23/2023 0946   HCO3 27.9 09/23/2023 0946   ACIDBASEDEF 0.8 09/22/2023 1520   O2SAT 96.2 09/23/2023 0946     Coagulation Profile: No results for input(s): "INR", "PROTIME" in the last 168 hours.  Cardiac Enzymes: No results for input(s): "CKTOTAL", "CKMB", "CKMBINDEX", "TROPONINI" in the last 168 hours.  HbA1C: Hgb A1c MFr Bld  Date/Time Value Ref Range Status  05/28/2022 06:17 PM 5.8 (H) 4.8 - 5.6 % Final    Comment:    (NOTE) Pre diabetes:          5.7%-6.4%  Diabetes:              >6.4%  Glycemic control for   <7.0% adults with diabetes   08/09/2019 07:31 AM 5.7 (H) 4.8 - 5.6 % Final    Comment:    (NOTE) Pre diabetes:          5.7%-6.4% Diabetes:              >6.4% Glycemic control for   <7.0% adults with diabetes     CBG: Recent Labs  Lab 09/24/23 1605 09/24/23 1932 09/24/23 2330 09/25/23 0319 09/25/23 0723  GLUCAP 93 90 102* 119* 123*    Review of Systems:   Unable to assess due to Intubation/sedation/critical illness  Past Medical History:  He,  has a past medical history of Alcohol abuse, Anemia, Anxiety, Arthritis, Cancer (HCC), COPD (chronic obstructive pulmonary disease) (HCC), Coronary artery disease, Depression, Gout, Hemiplegia and 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 side (HCC), Hypertension, Myocardial infarction (HCC), Neuromuscular disorder (HCC), Ogilvie syndrome, Seizures (HCC), Stroke (HCC), Tremors of nervous system, and Unstable angina (HCC).   Surgical History:   Past Surgical History:  Procedure Laterality Date   CARPAL TUNNEL RELEASE Left 10/19/2015   Procedure: CARPAL TUNNEL RELEASE;  Surgeon: Kennedy Bucker, MD;  Location: [facility];  Service: Orthopedics;  Laterality: Left;   COLONOSCOPY WITH PROPOFOL N/A 10/26/2021   Procedure: COLONOSCOPY WITH PROPOFOL;  Surgeon: Regis Bill, MD;  Location: [facility];   Service: Endoscopy;  Laterality: N/A;   COLONOSCOPY WITH PROPOFOL N/A 06/02/2022   Procedure: COLONOSCOPY WITH PROPOFOL;  Surgeon: Regis Bill, MD;  Location: [facility];  Service: Endoscopy;  Laterality: N/A;   COLONOSCOPY WITH PROPOFOL N/A 06/06/2022   Procedure: COLONOSCOPY WITH PROPOFOL;  Surgeon: Regis Bill, MD;  Location: [facility];  Service: Endoscopy;  Laterality: N/A;   COLOSTOMY REVISION N/A 09/11/2023   Procedure: COLOSTOMY REVISION;  Surgeon: Henrene Dodge, MD;  Location: [facility];  Service: General;  Laterality: N/A;   JOINT REPLACEMENT     left partial hip    LAPAROSCOPIC SIGMOID COLECTOMY  09/12/2010   Lap hand assisted sigmoidectomy, mobilization splenic flexure -- Dr. Freida Busman   LAPAROTOMY N/A 09/11/2023   Procedure: EXPLORATORY LAPAROTOMY;  Surgeon: Henrene Dodge, MD;  Location: [facility];  Service: General;  Laterality: N/A;   LAPAROTOMY N/A 09/15/2023   Procedure: EXPLORATORY LAPAROTOMY;  Surgeon: Sung Amabile, DO;  Location: [facility];  Service: General;  Laterality: N/A;   LYSIS OF ADHESION N/A 09/11/2023  Procedure: LYSIS OF ADHESION;  Surgeon: Henrene Dodge, MD;  Location: [facility];  Service: General;  Laterality: N/A;   THROAT SURGERY  09/05/2011   cancer     Social History:   reports that he has quit smoking. His smoking use included cigarettes. He has never used smokeless tobacco. He reports that he does not currently use alcohol. He reports that he does not use drugs.   Family History:  His family history includes Cancer in his mother; Hypertension in his father; Leukemia in his brother.   Allergies No Known Allergies   Home Medications  Prior to Admission medications   Medication Sig Start Date End Date Taking? Authorizing Provider  aspirin 81 MG chewable tablet Take 81 mg by mouth daily.   Yes [provider]  baclofen (LIORESAL) 5 mg TABS tablet Take 5 mg by mouth 2 (two) times daily.   Yes [provider]  diazePAM,  20 MG Dose, (VALTOCO 20 MG DOSE) 2 x 10 MG/0.1ML LQPK Place 20 mg into the nose as needed (seizure lasting over 2 minutes). 08/23/23  Yes Leroy Sea, MD  ipratropium-albuterol (DUONEB) 0.5-2.5 (3) MG/3ML SOLN Inhale 3 mLs into the lungs every 6 (six) hours as needed. 09/02/23  Yes [provider]  lactulose (CHRONULAC) 10 GM/15ML solution Take 20 g by mouth daily. 08/15/23  Yes [provider]  lamoTRIgine (LAMICTAL) 25 MG tablet Take 50 mg by mouth 2 (two) times daily.   Yes [provider]  levETIRAcetam (KEPPRA) 1000 MG tablet Take 1 tablet (1,000 mg total) by mouth 2 (two) times daily. 08/23/23  Yes Leroy Sea, MD  Melatonin 10 MG TABS Take 10 mg by mouth at bedtime.   Yes [provider]  nitroGLYCERIN (NITROSTAT) 0.4 MG SL tablet Place 0.4 mg under the tongue 3 (three) times daily as needed for chest pain. Every 5 minutes up to 3 doses 08/24/23  Yes [provider]  pantoprazole (PROTONIX) 40 MG tablet Take 1 tablet (40 mg total) by mouth daily. 08/23/23  Yes Leroy Sea, MD  polyethylene glycol (MIRALAX / GLYCOLAX) 17 g packet Take 17 g by mouth 2 (two) times daily. 08/19/23  Yes Wieting, Richard, MD  pregabalin (LYRICA) 100 MG capsule Take 200 mg by mouth at bedtime.   Yes [provider]  senna (SENOKOT) 8.6 MG TABS tablet Take 2 tablets (17.2 mg total) by mouth at bedtime as needed for mild constipation or moderate constipation. 06/26/23  Yes Sunnie Nielsen, DO  tamsulosin (FLOMAX) 0.4 MG CAPS capsule Take 0.4 mg by mouth every evening.   Yes [provider]  divalproex (DEPAKOTE) 125 MG DR tablet Take 4 tablets (500 mg total) by mouth 2 (two) times daily for 1 day, THEN 4 tablets (500 mg total) daily for 3 days, THEN 2 tablets (250 mg total) daily for 3 days, THEN 1 tablet (125 mg total) daily for 3 days. Patient not taking: Reported on 09/03/2023 08/23/23 09/03/23  Leroy Sea, MD  fluticasone  [facility]) 50 MCG/ACT nasal spray Place 2 sprays into both nostrils daily. Patient not taking: Reported on 09/03/2023 05/15/23   [provider]  Mouthwashes (MOUTH RINSE) LIQD solution 15 mLs by Mouth Rinse route every 2 (two) hours. Patient not taking: Reported on 09/03/2023 08/19/23   Alford Highland, MD  pregabalin (LYRICA) 50 MG capsule Take 50 mg capsule twice daily and 100 mg (two capsules) at bedtime Patient not taking: Reported on 09/03/2023 08/23/23   Susa Raring  K, MD  tiotropium (SPIRIVA) 18 MCG inhalation capsule Place 18 mcg into inhaler and inhale daily. Patient not taking: Reported on 09/03/2023    [provider]     Critical care time: 40 minutes    Harlon Ditty, AGACNP-BC [facility] Prefer epic messenger for cross cover needs If after hours, please call E-link

## 2023-09-26 ENCOUNTER — Inpatient Hospital Stay: Payer: Medicare Other

## 2023-09-26 ENCOUNTER — Encounter: Payer: Medicare Other | Admitting: Surgery

## 2023-09-26 DIAGNOSIS — J9602 Acute respiratory failure with hypercapnia: Secondary | ICD-10-CM | POA: Diagnosis not present

## 2023-09-26 DIAGNOSIS — Z7189 Other specified counseling: Secondary | ICD-10-CM | POA: Diagnosis not present

## 2023-09-26 DIAGNOSIS — J9601 Acute respiratory failure with hypoxia: Secondary | ICD-10-CM | POA: Diagnosis not present

## 2023-09-26 LAB — RENAL FUNCTION PANEL
Albumin: 1.9 g/dL — ABNORMAL LOW (ref 3.5–5.0)
Anion gap: 9 (ref 5–15)
BUN: 37 mg/dL — ABNORMAL HIGH (ref 8–23)
CO2: 21 mmol/L — ABNORMAL LOW (ref 22–32)
Calcium: 8.2 mg/dL — ABNORMAL LOW (ref 8.9–10.3)
Chloride: 111 mmol/L (ref 98–111)
Creatinine, Ser: 0.84 mg/dL (ref 0.61–1.24)
GFR, Estimated: >60 mL/min (ref >60)
Glucose, Bld: 179 mg/dL — ABNORMAL HIGH (ref 70–99)
Phosphorus: 2.3 mg/dL — ABNORMAL LOW (ref 2.5–4.6)
Potassium: 3.6 mmol/L (ref 3.5–5.1)
Sodium: 141 mmol/L (ref 135–145)

## 2023-09-26 LAB — CBC
HCT: 27.3 % — ABNORMAL LOW (ref 39.0–52.0)
Hemoglobin: 8.1 g/dL — ABNORMAL LOW (ref 13.0–17.0)
MCH: 28.3 pg (ref 26.0–34.0)
MCHC: 29.7 g/dL — ABNORMAL LOW (ref 30.0–36.0)
MCV: 95.5 fL (ref 80.0–100.0)
Platelets: 281 10*3/uL (ref 150–400)
RBC: 2.86 MIL/uL — ABNORMAL LOW (ref 4.22–5.81)
RDW: 15.5 % (ref 11.5–15.5)
WBC: 21.1 10*3/uL — ABNORMAL HIGH (ref 4.0–10.5)
nRBC: 0.9 % — ABNORMAL HIGH (ref 0.0–0.2)

## 2023-09-26 LAB — BLOOD GAS, VENOUS
Acid-base deficit: 6 mmol/L — ABNORMAL HIGH (ref 0.0–2.0)
Bicarbonate: 21.5 mmol/L (ref 20.0–28.0)
O2 Saturation: 74.4 %
Patient temperature: 37
pCO2, Ven: 49 mm[Hg] (ref 44–60)
pH, Ven: 7.25 (ref 7.25–7.43)
pO2, Ven: 44 mm[Hg] (ref 32–45)

## 2023-09-26 LAB — MAGNESIUM: Magnesium: 2 mg/dL (ref 1.7–2.4)

## 2023-09-26 LAB — GLUCOSE, CAPILLARY
Glucose-Capillary: 172 mg/dL — ABNORMAL HIGH (ref 70–99)
Glucose-Capillary: 138 mg/dL — ABNORMAL HIGH (ref 70–99)
Glucose-Capillary: 139 mg/dL — ABNORMAL HIGH (ref 70–99)
Glucose-Capillary: 186 mg/dL — ABNORMAL HIGH (ref 70–99)
Glucose-Capillary: 174 mg/dL — ABNORMAL HIGH (ref 70–99)
Glucose-Capillary: 155 mg/dL — ABNORMAL HIGH (ref 70–99)

## 2023-09-26 MED ORDER — POLYETHYLENE GLYCOL 3350 17 G PO PACK
17.0000 g | PACK | Freq: Every day | ORAL | Status: DC
  Filled 2023-09-26: qty 1

## 2023-09-26 MED ORDER — METHYLPREDNISOLONE SODIUM SUCC 40 MG IJ SOLR
20.0000 mg | Freq: Two times a day (BID) | INTRAMUSCULAR | Status: DC
  Administered 2023-09-26 – 2023-10-02 (×14): 20 mg via INTRAVENOUS
  Filled 2023-09-26 (×14): qty 1

## 2023-09-26 MED ORDER — OXYCODONE HCL 5 MG PO TABS
5.0000 mg | ORAL_TABLET | Freq: Four times a day (QID) | ORAL | Status: DC
  Administered 2023-09-26 – 2023-10-06 (×33): 5 mg
  Filled 2023-09-26 (×34): qty 1

## 2023-09-26 MED ORDER — DOCUSATE SODIUM 50 MG/5ML PO LIQD
100.0000 mg | Freq: Two times a day (BID) | ORAL | Status: DC | PRN
  Filled 2023-09-26 (×2): qty 10

## 2023-09-26 MED ORDER — SODIUM BICARBONATE 8.4 % IV SOLN
100.0000 meq | Freq: Once | INTRAVENOUS | Status: AC
  Administered 2023-09-26: 100 meq via INTRAVENOUS
  Filled 2023-09-26: qty 50

## 2023-09-26 MED ORDER — INSULIN ASPART 100 UNIT/ML IJ SOLN
0.0000 [IU] | INTRAMUSCULAR | Status: DC
  Administered 2023-09-26 – 2023-09-27 (×4): 3 [IU] via SUBCUTANEOUS
  Administered 2023-09-27: 2 [IU] via SUBCUTANEOUS
  Administered 2023-09-27 (×2): 3 [IU] via SUBCUTANEOUS
  Administered 2023-09-28: 2 [IU] via SUBCUTANEOUS
  Administered 2023-09-28: 3 [IU] via SUBCUTANEOUS
  Administered 2023-09-30 – 2023-10-02 (×7): 2 [IU] via SUBCUTANEOUS
  Administered 2023-10-02: 3 [IU] via SUBCUTANEOUS
  Administered 2023-10-02 – 2023-10-06 (×16): 2 [IU] via SUBCUTANEOUS
  Filled 2023-09-26 (×35): qty 1

## 2023-09-26 NOTE — Consult Note (Addendum)
WOC Nurse ostomy follow up Stoma type/location: LLQ colostomy and mucus fistula  Stomal assessment/size: approximately 3", edematous, os in center  Peristomal assessment: intact  Treatment options for stomal/peristomal skin: (2) 2" barrier rings cut and flattened out to encircle stoma, no sting barrier wipe Output  liquid brown effluent from os when attempting to change  Ostomy pouching: 1pc. 4" ostomy kit [facility] [place] [phone])   Education provided: none, this RN removed old pouch, cleansed and assessed stoma and replaced with a 4" ostomy kit, no other pouching system will suffice.   Enrolled patient in [place] Secure Start Discharge program: yes on previous visit   WOC team will continue to follow every 7 to 10 days. Please re-consult if urgent needs arise.   I did place an additional 4" kit at bedside [facility] [place] [phone]  Thank you,    Priscella Mann MSN, RN-BC, [employer] [phone]

## 2023-09-26 NOTE — Progress Notes (Signed)
Nutrition Follow-up  DOCUMENTATION CODES:   Not applicable  INTERVENTION:   -TPN management per pharmacy; d/c today -TF via OGT:    Continue Vital 1.5 @ 40 ml/hr and increase by 10 ml every 12 hours to goal rate of 50 ml/hr.    60 ml Prosource TF BID   30 ml free water flush every 4 hours    Tube feeding regimen provides 1960 kcal (100% of needs), 121 grams of protein, and 917 ml of H2O. Total free water: 1097 ml daily  NUTRITION DIAGNOSIS:   Inadequate oral intake related to altered GI function as evidenced by NPO status.  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  12/31- s/p BSE- plan for dysphagia 1 diet with nectar thick liquids when cleared for diet per SLP 1/1- NGT placed (per KUB on 09/05/23 revealed tip of tube in stomach)  1/3- PICC placed, TPN 1/4- NGT fell out and replaced 1/6- NGT pulled again 1/7- s/p Exploratory Laparotomy, lysis of adhesions, revision of loop colostomy into end colostomy with mucous fistula 1/9- NGT d/c, advanced to clear liquid diet 1/10- advanced to full liquids 1/11- TPN d/c 1/12- s/p closure of wound dehiscence  1/13- advanced to clear liquid diet, NGT d/c 1/14- advanced to full liquid diet 1/15- s/p BSE- advanced to dysphagia 1 diet with nectar thick liquids; CT scan revealed fascial dehiscence in 1-2 areas of midline incision  1/17- transferred to ICU due to aspiration event, emergently intubated 1/18- TPN  initiated, extubated 1/19- re-intubated  1/20- trickle TF initiated   Patient is currently intubated on ventilator support. Per KUB on 09/24/23, OGT tip in stomach.  MV: 16.8 L/min Temp (24hrs), Avg:98 F (36.7 C), Min:97.5 F (36.4 C), Max:98.5 F (36.9 C)  Propofol: 18.9 ml/hr (provides 499 kcals daily)  Reviewed I/O's: +1 L x 24 hours and -2.7 L since 09/12/23  UOP: 2.1 L x 24 hours  OGT output: 135 ml x 24 hours  Colostomy output: 675 ml x 24 hours  Pt remains on TF via OGT: Vital 1.5 currently infusing at 40 ml/hr.   Pt remains on TPN, currently receiving 42 ml/hr, which provides 887 kcals and 50 grams protein, meeting 41% of estimated kcal needs and 45% of estimated protein needs. Plan to d/c after bag today.   Wt has been stable over the past week.   Palliative care following for goals of care discussions; possible plan for trach.   Medications reviewed and include lovenox, solu-medrol, protonix, keppra, levophed, and propofol.   Labs reviewed: CBGS: 118-172 (inpatient orders for glycemic control are 0-15 units insulin aspart every 4 hours).    NUTRITION - FOCUSED PHYSICAL EXAM:  Flowsheet Row Most Recent Value  Orbital Region Mild depletion  Upper Arm Region No depletion  Thoracic and Lumbar Region No depletion  Buccal Region No depletion  Temple Region Mild depletion  Clavicle Bone Region No depletion  Clavicle and Acromion Bone Region No depletion  Scapular Bone Region No depletion  Dorsal  Hand No depletion  Patellar Region Mild depletion  Anterior Thigh Region Mild depletion  Posterior Calf Region Mild depletion  Edema (RD Assessment) Mild  Hair Reviewed  Eyes Reviewed  Mouth Reviewed  Skin Reviewed  Nails Reviewed       Diet Order:   Diet Order             Diet NPO time specified  Diet effective now                   EDUCATION NEEDS:   Not appropriate for education at this time  Skin:  Skin Assessment: Skin Integrity Issues: Skin  Integrity Issues:: Incisions Unstageable: rt and lt heels Incisions: closed abdomen  Last BM:  09/25/23 (250 ml via colostomy)  Height:   Ht Readings from Last 1 Encounters:  09/23/23 5' 10.98" (1.803 m)    Weight:   Wt Readings from Last 1 Encounters:  09/24/23 93.3 kg    Ideal Body Weight:  78.1 kg  BMI:  Body mass index is 28.7 kg/m.  Estimated Nutritional Needs:   Kcal:  2103  Protein:  110-125 grams  Fluid:  1.8-2.0 L    Levada Schilling, RD, LDN, CDCES Registered Dietitian III Certified Diabetes Care and Education Specialist If unable to reach this RD, please use "RD Inpatient" group chat on secure chat between hours of 8am-4 pm daily

## 2023-09-26 NOTE — Progress Notes (Signed)
Progress Note   Updated pts daughter Georgina Quint via telephone regarding pts condition and current plan of care.  All questions answered will continue to monitor and assess pt.   Zada Girt, AGNP  Pulmonary/Critical Care Pager [phone] (please enter 7 digits) PCCM Consult Pager [phone] (please enter 7 digits)

## 2023-09-26 NOTE — Progress Notes (Addendum)
Daily Progress Note   [name]       Date: 09/26/2023 DOB: 1951-02-07  Age: 73 y.o. MRN#: [id number] Attending Physician: Erin Fulling, MD Primary Care Physician: Keane Police, MD Admit Date: 09/03/2023  Reason for Consultation/Follow-up: Establishing goals of care  Subjective: Notes and labs reviewed.  Patient is currently resting in bed on ventilator support.  Discussed this case in detail with CCM NP.  Patient not ready for extubation.  Continuing discussions of possible tracheostomy.  PMT will follow; time for outcomes.  Length of Stay: 23  Current Medications: Scheduled Meds:   acetaminophen  650 mg Per Tube Q6H   baclofen  5 mg Per Tube BID   budesonide (PULMICORT) nebulizer solution  0.25 mg Nebulization BID   Chlorhexidine Gluconate Cloth  6 each Topical Daily   Chlorhexidine Gluconate Cloth  6 each Topical Q0600   enoxaparin (LOVENOX) injection  40 mg Subcutaneous QHS   feeding supplement (PROSource TF20)  60 mL Per Tube BID   free water  30 mL Per Tube Q4H   insulin aspart  0-15 Units Subcutaneous Q6H   ipratropium-albuterol  3 mL Inhalation TID   lamoTRIgine  50 mg Per Tube BID   methylPREDNISolone (SOLU-MEDROL) injection  20 mg Intravenous Q12H   mupirocin ointment  1 Application Nasal BID   mouth rinse  15 mL Mouth Rinse Q2H   oxyCODONE  5 mg Per Tube Q6H   pantoprazole (PROTONIX) IV  40 mg Intravenous Q12H   [START ON 09/27/2023] polyethylene glycol  17 g Per Tube Daily   pregabalin  100 mg Per Tube Daily   pregabalin  200 mg Per Tube QHS   sodium chloride flush  10-40 mL Intracatheter Q12H   sodium chloride flush  3-10 mL Intravenous Q12H    Continuous Infusions:  feeding supplement (VITAL 1.5 CAL) 1,000 mL (09/26/23 0943)    levETIRAcetam 1,000 mg (09/26/23 1002)   norepinephrine (LEVOPHED) Adult infusion Stopped (09/26/23 0121)   piperacillin-tazobactam (ZOSYN)  IV 3.375 g (09/26/23 0920)   propofol (DIPRIVAN) infusion 30 mcg/kg/min (09/26/23 0813)   TPN (CLINIMIX) Adult without lytes 42 mL/hr at 09/26/23 0600    PRN Meds: docusate, HYDROmorphone (DILAUDID) injection, ipratropium-albuterol, midazolam, mouth rinse, senna, sodium chloride flush, sodium chloride flush  Physical Exam Constitutional:  Comments: Eyes closed  Pulmonary:     Comments: Ventilator support Skin:    General: Skin is warm and dry.             Vital Signs: BP (!) 112/59   Pulse 98   Temp 98.2 F (36.8 C) (Axillary)   Resp (!) 34   Ht 5' 10.98" (1.803 m)   Wt 93.3 kg   SpO2 96%   BMI 28.70 kg/m  SpO2: SpO2: 96 % O2 Device: O2 Device: Ventilator O2 Flow Rate: O2 Flow Rate (L/min): 2 L/min  Intake/output summary:  Intake/Output Summary (Last 24 hours) at 09/26/2023 1315 Last data filed at 09/26/2023 1200 Gross per 24 hour  Intake 2405.22 ml  Output 3660 ml  Net -1254.78 ml   LBM: Last BM Date : 09/25/23 Baseline Weight: Weight: 97.8 kg Most recent weight: Weight: 93.3 kg    Patient Active Problem List   Diagnosis Date Noted   Aspiration into airway 09/21/2023   Small bowel obstruction due to adhesions (HCC) 09/11/2023   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   Cancer of sigmoid (HCC) 09/04/2023   Gout 09/04/2023   Acute respiratory failure with hypoxia (HCC) 09/03/2023   Acute metabolic encephalopathy 08/19/2023   History of CVA (cerebrovascular accident) 08/19/2023   Thrombocytopenia (HCC) 08/19/2023   Hypotension 07/15/2023   Small bowel obstruction (HCC) 07/15/2023   Hemiplegia and 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 side (HCC)    Temporal pain 06/18/2022   Acute kidney injury superimposed on CKD (HCC) 06/17/2022   Dysphagia 06/07/2022   Respiratory distress 06/02/2022    Neuropathy 06/02/2022   Paroxysmal atrial flutter (HCC) 06/01/2022   Aspiration pneumonia (HCC) 06/01/2022   Cervical spinal stenosis 05/31/2022   Chronic diastolic CHF (congestive heart failure) (HCC) 05/28/2022   SBO, recurrent (small bowel obstruction) (HCC)    Abdominal distention    HLD (hyperlipidemia) 10/16/2021   Nausea vomiting and diarrhea 10/16/2021   Sepsis (HCC) 10/16/2021   Tobacco abuse 10/16/2021   Muscle twitching 08/14/2019   UTI (urinary tract infection) 08/03/2019   Ileus (HCC) 08/03/2019   Hypokalemia 08/03/2019   QT prolongation 08/03/2019   Ogilvie syndrome    Acute abdominal pain 04/03/2019   Left-sided weakness 09/30/2017   Seizure (HCC) 09/30/2017   Essential hypertension 09/30/2017   CAD (coronary artery disease) 09/30/2017   COPD (chronic obstructive pulmonary disease) (HCC) 09/30/2017   Depression with anxiety 09/30/2017    Palliative Care Assessment & Plan     Recommendations/Plan: Continue current care.  Time for outcomes.  Code Status:    Code Status Orders  (From admission, onward)           Start     Ordered   09/03/23 0754  Full code  Continuous       Question:  By:  Answer:  Consent: discussion documented in EHR   09/03/23 0755           Code Status History     Date Active Date Inactive Code Status Order ID Comments User Context   08/19/2023 1314 08/23/2023 1838 Full Code [id number]  Alan Mulder, MD Inpatient   08/18/2023 1448 08/19/2023 1154 Full Code [id number]  Emeline General, MD ED   07/18/2023 1748 07/24/2023 1712 Full Code [id number]  Kathrynn Running, MD Inpatient   07/15/2023 0538 07/18/2023 1551 Full Code [id number]  Andris Baumann, MD ED   06/22/2023 0939 06/27/2023 0108 Full Code [id number]  Emeline General, MD ED   05/28/2022 1622  06/30/2022 2326 Full Code [id number]  Lorretta Harp, MD ED   10/23/2021 2158 11/02/2021 2029 Full Code [id number]  Lajoyce Corners, NP Inpatient   10/16/2021 1144 10/23/2021 2158 DNR  [id number]  Lorretta Harp, MD ED   10/02/2021 614 084 0780 10/02/2021 1641 DNR [id number]  Loleta Rose, MD ED   08/03/2019 2226 08/14/2019 2350 Full Code [id number]  Lurene Shadow, MD Inpatient   04/03/2019 1711 04/08/2019 1731 Full Code [id number]  Auburn Bilberry, MD Inpatient   04/03/2019 1627 04/03/2019 1710 DNR [id number]  Auburn Bilberry, MD ED   09/30/2017 2301 10/02/2017 2039 Full Code [id number]  Oralia Manis, MD Inpatient       Prognosis: Poor overall  Care plan was discussed with CCM NP  Thank you for allowing the Palliative Medicine Team to assist in the care of this patient.     Morton Stall, NP  Please contact Palliative Medicine Team phone at [phone] for questions and concerns.

## 2023-09-26 NOTE — Progress Notes (Signed)
NAME:  Calvin [name], MRN:  [id number], DOB:  12/29/1950, LOS: 23 ADMISSION DATE:  09/03/2023, CONSULTATION DATE:  09/21/2023 REFERRING MD:  Manuela Schwartz, NP, CHIEF COMPLAINT:  Acute Respiratory Distress, Aspiration event   Brief Pt Description / Synopsis:  73 y.o. male admitted with PMHx most significant for colon cancer status post transverse colectomy and colostomy, presented with nausea, vomiting, and shortness of breath.  Was admitted with Sepsis due to Influenza A infection, Aspiration pneumonia, and Small Bowel Obstruction.  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  Course further complicated by Aspiration event requiring intubation and mechanical ventilation on 1/17.  History of Present Illness:  Calvin [name] is a 73 y.o. 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   Was admitted by [facility] for further workup and treatment of Was admitted with Sepsis due to Influenza A infection, Aspiration pneumonia, and Small Bowel Obstruction.  Please see "Significant Hospital Events" section below for full detailed hospital course.  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 side (HCC)    Hypertension    Myocardial infarction (HCC)    Neuromuscular disorder (HCC)     Ogilvie syndrome    Seizures (HCC)    last 6 months ago   Stroke [facility])    multiple  left side weakness   Tremors of nervous system    Unstable angina (HCC)      Micro Data:  12/30: SARS-CoV-2/RSV/Flu PCR>>+ Influenza A 12/30: Blood cultures x2>>negative  12/30: Urine>>Staphylococcus Aureus 12/30: MRSA PCR + 1/17: Tracheal aspirate>>moderate pseudomonas  1/18: MRSA PCR +  Anti-infectives (From admission, onward)    Start     Dose/Rate Route Frequency Ordered Stop   09/23/23 1100  vancomycin (VANCOCIN) IVPB 1000 mg/200 mL premix  Status:  Discontinued        1,000 mg 200 mL/hr over 60 Minutes Intravenous Every 12 hours 09/23/23 0811 09/24/23 1021   09/22/23 2300  vancomycin (VANCOCIN) IVPB 1000 mg/200 mL premix  Status:  Discontinued        1,000 mg 200 mL/hr over 60 Minutes Intravenous Every 24 hours 09/22/23 0819 09/23/23 0811   09/22/23 1000  vancomycin (VANCOREADY) IVPB 1500 mg/300 mL        1,500 mg 150 mL/hr over 120 Minutes Intravenous  Once 09/22/23 0819 09/22/23 1916   09/21/23 0815  piperacillin-tazobactam (ZOSYN) IVPB 3.375 g        3.375 g 12.5 mL/hr over 240 Minutes Intravenous Every 8 hours 09/21/23 0729 09/27/23 2359   09/11/23 2000  cefoTEtan (CEFOTAN) 2 g in sodium chloride 0.9 % 100 mL IVPB        2 g 200 mL/hr over 30 Minutes Intravenous Every 8 hours 09/11/23 1801 09/12/23 1910   09/11/23 0600  cefoTEtan (CEFOTAN) 2 g in sodium chloride 0.9 % 100 mL IVPB        2 g 200 mL/hr over 30 Minutes Intravenous On call to O.R. 09/10/23 0858 09/12/23 1910   09/04/23 1200  Ampicillin-Sulbactam (UNASYN) 3 g in sodium chloride 0.9 % 100 mL IVPB  Status:  Discontinued        3 g 200 mL/hr over 30 Minutes Intravenous Every 6 hours 09/04/23 1052 09/08/23 1537   09/03/23 1800  ceFEPIme (MAXIPIME) 2 g in sodium chloride 0.9 % 100 mL IVPB  Status:  Discontinued        2 g 200 mL/hr over 30 Minutes Intravenous Every 12 hours 09/03/23 0812 09/04/23 0903   09/03/23 1500   metroNIDAZOLE (FLAGYL) IVPB 500 mg  Status:  Discontinued        500 mg 100 mL/hr over 60 Minutes Intravenous Every 12 hours 09/03/23 0758 09/04/23 0903   09/03/23 1000  oseltamivir (TAMIFLU) capsule 30 mg        30 mg Oral 2 times daily 09/03/23 0757 09/08/23 0959   09/03/23 0315  ceFEPIme (MAXIPIME) 2 g in sodium chloride 0.9 % 100 mL IVPB        2 g 200 mL/hr over 30 Minutes Intravenous  Once 09/03/23 0304 09/03/23 0441   09/03/23 0315  metroNIDAZOLE (FLAGYL) IVPB 500 mg        500 mg 100 mL/hr over 60 Minutes Intravenous  Once 09/03/23 0304 09/03/23 0441   09/03/23 0315  vancomycin (VANCOCIN) IVPB 1000 mg/200 mL premix        1,000 mg 200 mL/hr over 60 Minutes Intravenous  Once 09/03/23 0304 09/03/23 0441      Significant Hospital Events: Including procedures, antibiotic start and stop dates in addition to other pertinent events   12/30: admitted to hospitalist sepsis, AKI, (+)Influenza A, likely aspiration pneumonia, acute hypoxic resp fail, SBO. NG placed, surgical consult.  12/31-01/06: relatively stable, remains SBO w/ NG and on TPN, worsening colostomy prolapse. Anemia w/o overt GIB, holding anti-plt/anti-cogs, GI no plans for endoscopic eval at this time 01/07: exlap, LOA, and colostomy revision  01/08: stable.  01/09: d/c NG 01/10: advancing diet as able. Hgb 6.8 - 1 unit PRBC given  01/11: persistent abd pain and cough, imaging concerning for abdominal surgical wound dehiscence deeper layers, going back to OR. Continue w/ cough suppression, await over-read but no obvious CT chest findings, suspect post-viral bronchitis  01/12: to OR early AM for closure of wound dehiscence. Postop stable, NG in place. CT chest question pneumonia but pt reports coughing improved, suspect was mostly reflux, will continue to monitor cough. Postop care per general surgery include clamp NG and remain NPO for now   01/13: NG out, CLD 01/14: FLD, continue advancing diet tolerated  1/15: Advanced to  dysphagia diet after ST eval.  Discontinue telemetry, palliative care consult, DC central line 1/16: Underwent CT abdomen/pelvis last evening.  Not a candidate for another surgery due to poor functional and nutritional status and multiple recent procedures.  Family deciding on goals of care/hospice 1/17: Aspirated this morning with development of severe respiratory distress and hypoxia requiring emergent intubation.  Now with developing septic shock.  Pt's daughter requesting transfer to [facility] or [facility].  Dr. Aleen Campi spoke both facilities which are at capacity and not accepting transfers. 1/18: Pt remains mechanically intubated overnight required increased dose of fentanyl and vecuronium x1 dose due to vent dyssynchrony.  Pt currently unable to follow commands RASS goal -  4.  Will perform WUA and for possible SBT.  Pt requiring levophed gtt @5  mcg/min and vasopressin 0.03 units/min to maintain map 65 or higher.  Pt extubated to Bipap able to follow commands  1/19: Pt with increased work of breathing due to increased secretions despite scheduled hypertonic saline, Chest PT, and Bipap requiring reintubation  1/20: On minimal vent settings, will keep intubated today as required reintubation yesterday.  Off Vasopressors. KUB with improving ileus, General Surgery ok with starting trickle feeds 1/21: On minimal vent settings.  Daughter at bedside for SBT, FAILED SBT due to tachypnea and increased WOB. 1/22: Pt remains mechanically intubated on PRVC vent settings PEEP 5/FiO2 30% with increased work of breathing; vbg revealed metabolic acidosis requiring 2 amps of bicarb   Interim History / Subjective:  As outlined above under significant events   Objective   Blood pressure (!) 112/59, pulse 98, temperature 98.2 F (36.8 C), temperature source Axillary, resp. rate (!) 34, height 5' 10.98" (1.803 m), weight 93.3 kg, SpO2 96%.    Vent Mode: PRVC FiO2 (%):  [30 %-40 %] 30 % Set Rate:  [18 bmp] 18 bmp Vt Set:   [500 mL] 500 mL PEEP:  [5 cmH20] 5 cmH20 Plateau Pressure:  [22 cmH20-30 cmH20] 22 cmH20   Intake/Output Summary (Last 24 hours) at 09/26/2023 0846 Last data filed at 09/26/2023 0600 Gross per 24 hour  Intake 3921.24 ml  Output 2910 ml  Net 1011.24 ml   Filed Weights   09/20/23 0500 09/23/23 0400 09/24/23 0457  Weight: 92.2 kg 93.3 kg 93.3 kg   Examination: General: Acute on chronically-ill appearing male, mechanically intubated with mild respiratory distress  HENT: Atraumatic, normocephalic, neck supple, orally intubated Lungs: Rhonchi throughout, tachypneic with accessory muscle use  Cardiovascular: NSR, s1s2, no m/r/g, 2+ radial/1+ distal pulses, no edema Abdomen: +BS x4, soft, non distended, colostomy and mucous fistula in left upper quadrant dressing dry/intact, stool in bag dressing dry/intact Extremities: Normal bulk and tone, no deformities Neuro: Sedated, following some commands, PERRL GU: External catheter draining yellow urine   Resolved Hospital Problem list     Assessment & Plan:   #Acute metabolic encephalopathy #Mechanical intubation pain/discomfort  Hx: Seizures  - Maintain a RASS goal of 0 to -1 - PAD protocol to maintain RASS goal: propofol gtt and prn dilaudid  - Avoid sedating medications as able - Continue scheduled baclofen  - Daily wake up assessment - Continue lamictal and keppra - Seizure precautions   #Acute hypoxic & hypercapnic respiratory failure #Large volume aspiration #COPD without acute exacerbation #Influenza A infection (present on admission) - Full vent support for now  - Continue lung protective strategies - Maintain plateau pressures less than 30 cm H20 - Wean FiO2 & PEEP as tolerated to maintain O2 sats >92% - Follow intermittent CXR & ABG as needed - SBT once respiratory parameters met and mental status permits - Implement VAP Bundle - Scheduled and prn bronchodilator therapy  - May require tracheostomy of this aligns with  goals of care   #Shock: suspect septic +/- hypovolemic ~ RESOLVED #Chronic HFpEF Echocardiogram 09/22/23: EF 50%; left ventricle demonstrates global hypokinesis; mildly elevated pulmonary artery systolic pressure; large pleural effusion in the left lateral region; trivial mitral valve regurgitation; trivial aortic valve regurgitation  - Continuous telemetry monitoring - Trend lactic acid until normalized - Troponin peaked at 25 - Diuresis as BP and renal function permits  #AKI on CKD stage II~RESOLVED #BPH - Trend BMP - Replace electrolytes as  indicated  - Ensure adequate renal perfusion - Avoid nephrotoxic agents as able - Replace electrolytes as indicated ~ Pharmacy following for assistance with electrolyte replacement  #Aspiration pneumonia #Pseudomonas pneumonia  #Influenza Infection (POA)~TREATED - Trend WBC and monitor fever curve - Trend PCT   - Follow cultures as above - Continue zosyn  (plan for 7 day course)  #Small bowel obstruction vs ileus #Colostomy irrigation/prolapse #Hx: ogilvie syndrome s/p colostomy - NPO; OGT to LIS - General Surgery following, appreciate input: continue TPN and TF's   #Anemia - Trend CBC  - Monitor for s/sx of bleeding  - Transfuse for hgb <7   #Prediabetes - CBG's q4hrs - Target range of 140 to 180 - SSI - Follow ICU Hypo/Hyperglycemia protocol  Patient is critically ill with shock and multiorgan failure.  Prognosis is guarded, high risk for further decompensation, cardiac arrest and death.  Given current critical illness superimposed on multiple chronic comorbidities including colon cancer, along with advanced age, overall long-term prognosis is poor.  Recommend consideration for DNR status.  Palliative care is following for ongoing goals of care discussions. Best Practice (right click and "Reselect all SmartList Selections" daily)   Diet/type: TPN and TF's; if continuing to tolerate TF's will stop TPN  DVT prophylaxis: Lovenox   GI prophylaxis: PPI Lines: Central line and yes and it is still needed (PICC for TPN) Foley:  N/A  Code Status:  full code Last date of multidisciplinary goals of care discussion [09/26/23]  09/26/23: Will update pts daughter when she arrives at bedside   Labs   CBC: Recent Labs  Lab 09/22/23 0526 09/23/23 0411 09/24/23 0436 09/25/23 0435 09/26/23 0535  WBC 21.8* 16.9* 18.0* 15.3* 21.1*  HGB 8.9* 8.0* 7.7* 7.9* 8.1*  HCT 30.1* 26.5* 25.4* 26.8* 27.3*  MCV 97.4 95.7 93.7 93.4 95.5  PLT 249 212 248 267 281    Basic Metabolic Panel: Recent Labs  Lab 09/22/23 0526 09/22/23 0958 09/23/23 0411 09/24/23 0436 09/24/23 1443 09/25/23 0435 09/25/23 1652 09/26/23 0535  NA 138  --  137 140  --  140  --  141  K 4.8  --  4.3 3.7  --  3.9  --  3.6  CL 104  --  104 108  --  112*  --  111  CO2 24  --  23 23  --  20*  --  21*  GLUCOSE 132*  --  182* 133*  --  138*  --  179*  BUN 17  --  18 29*  --  41*  --  37*  CREATININE 1.35*  --  0.97 0.95  --  0.93  --  0.84  CALCIUM 7.5*  --  7.6* 8.1*  --  8.0*  --  8.2*  MG  --    < > 2.4 2.4 2.5* 2.1 2.1 2.0  PHOS 4.4  --  2.5 1.7*  1.7* 4.5 3.2 5.3* PENDING   < > = values in this interval not displayed.   GFR: Estimated Creatinine Clearance: 90 mL/min (by C-G formula based on SCr of 0.84 mg/dL). Recent Labs  Lab 09/21/23 0746 09/21/23 1007 09/22/23 0526 09/22/23 0958 09/23/23 0411 09/24/23 0436 09/25/23 0435 09/26/23 0535  WBC 16.8*  --    < >  --  16.9* 18.0* 15.3* 21.1*  LATICACIDVEN 1.2 2.0*  --  1.1  --   --   --   --    < > = values in this interval not displayed.  Liver Function Tests: Recent Labs  Lab 09/21/23 0746 09/22/23 0526 09/23/23 0411 09/24/23 0436 09/25/23 0435 09/26/23 0535  AST 38  --   --  12*  --   --   ALT 48*  --   --  16  --   --   ALKPHOS 133*  --   --  74  --   --   BILITOT 0.8  --   --  0.6  --   --   PROT 7.0  --   --  6.1*  --   --   ALBUMIN 2.6* 2.1* 2.0* 2.0* 1.8* 1.9*   No  results for input(s): "LIPASE", "AMYLASE" in the last 168 hours. No results for input(s): "AMMONIA" in the last 168 hours.  ABG    Component Value Date/Time   PHART 7.33 (L) 09/23/2023 0946   PCO2ART 53 (H) 09/23/2023 0946   PO2ART 73 (L) 09/23/2023 0946   HCO3 21.5 09/26/2023 0755   ACIDBASEDEF 6.0 (H) 09/26/2023 0755   O2SAT 74.4 09/26/2023 0755     Coagulation Profile: No results for input(s): "INR", "PROTIME" in the last 168 hours.  Cardiac Enzymes: No results for input(s): "CKTOTAL", "CKMB", "CKMBINDEX", "TROPONINI" in the last 168 hours.  HbA1C: Hgb A1c MFr Bld  Date/Time Value Ref Range Status  05/28/2022 06:17 PM 5.8 (H) 4.8 - 5.6 % Final    Comment:    (NOTE) Pre diabetes:          5.7%-6.4%  Diabetes:              >6.4%  Glycemic control for   <7.0% adults with diabetes   08/09/2019 07:31 AM 5.7 (H) 4.8 - 5.6 % Final    Comment:    (NOTE) Pre diabetes:          5.7%-6.4% Diabetes:              >6.4% Glycemic control for   <7.0% adults with diabetes     CBG: Recent Labs  Lab 09/25/23 1631 09/25/23 1926 09/25/23 2351 09/26/23 0455 09/26/23 0813  GLUCAP 120* 118* 140* 172* 138*    Review of Systems:   Unable to assess due to Intubation/sedation/critical illness  Past Medical History:  He,  has a past medical history of Alcohol abuse, Anemia, Anxiety, Arthritis, Cancer (HCC), COPD (chronic obstructive pulmonary disease) (HCC), Coronary artery disease, Depression, Gout, Hemiplegia and 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 side (HCC), Hypertension, Myocardial infarction (HCC), Neuromuscular disorder (HCC), Ogilvie syndrome, Seizures (HCC), Stroke (HCC), Tremors of nervous system, and Unstable angina (HCC).   Surgical History:   Past Surgical History:  Procedure Laterality Date   CARPAL TUNNEL RELEASE Left 10/19/2015   Procedure: CARPAL TUNNEL RELEASE;  Surgeon: Kennedy Bucker, MD;  Location: [facility];  Service:  Orthopedics;  Laterality: Left;   COLONOSCOPY WITH PROPOFOL N/A 10/26/2021   Procedure: COLONOSCOPY WITH PROPOFOL;  Surgeon: Regis Bill, MD;  Location: [facility];  Service: Endoscopy;  Laterality: N/A;   COLONOSCOPY WITH PROPOFOL N/A 06/02/2022   Procedure: COLONOSCOPY WITH PROPOFOL;  Surgeon: Regis Bill, MD;  Location: [facility];  Service: Endoscopy;  Laterality: N/A;   COLONOSCOPY WITH PROPOFOL N/A 06/06/2022   Procedure: COLONOSCOPY WITH PROPOFOL;  Surgeon: Regis Bill, MD;  Location: [facility];  Service: Endoscopy;  Laterality: N/A;   COLOSTOMY REVISION N/A 09/11/2023   Procedure: COLOSTOMY REVISION;  Surgeon: Henrene Dodge, MD;  Location: [facility];  Service: General;  Laterality: N/A;   JOINT REPLACEMENT  left partial hip    LAPAROSCOPIC SIGMOID COLECTOMY  09/12/2010   Lap hand assisted sigmoidectomy, mobilization splenic flexure -- Dr. Freida Busman   LAPAROTOMY N/A 09/11/2023   Procedure: EXPLORATORY LAPAROTOMY;  Surgeon: Henrene Dodge, MD;  Location: [facility];  Service: General;  Laterality: N/A;   LAPAROTOMY N/A 09/15/2023   Procedure: EXPLORATORY LAPAROTOMY;  Surgeon: Sung Amabile, DO;  Location: [facility];  Service: General;  Laterality: N/A;   LYSIS OF ADHESION N/A 09/11/2023   Procedure: LYSIS OF ADHESION;  Surgeon: Henrene Dodge, MD;  Location: [facility];  Service: General;  Laterality: N/A;   THROAT SURGERY  09/05/2011   cancer     Social History:   reports that he has quit smoking. His smoking use included cigarettes. He has never used smokeless tobacco. He reports that he does not currently use alcohol. He reports that he does not use drugs.   Family History:  His family history includes Cancer in his mother; Hypertension in his father; Leukemia in his brother.   Allergies No Known Allergies   Home Medications  Prior to Admission medications   Medication Sig Start Date End Date Taking? Authorizing Provider  aspirin 81 MG chewable tablet  Take 81 mg by mouth daily.   Yes [provider]  baclofen (LIORESAL) 5 mg TABS tablet Take 5 mg by mouth 2 (two) times daily.   Yes [provider]  diazePAM, 20 MG Dose, (VALTOCO 20 MG DOSE) 2 x 10 MG/0.1ML LQPK Place 20 mg into the nose as needed (seizure lasting over 2 minutes). 08/23/23  Yes Leroy Sea, MD  ipratropium-albuterol (DUONEB) 0.5-2.5 (3) MG/3ML SOLN Inhale 3 mLs into the lungs every 6 (six) hours as needed. 09/02/23  Yes [provider]  lactulose (CHRONULAC) 10 GM/15ML solution Take 20 g by mouth daily. 08/15/23  Yes [provider]  lamoTRIgine (LAMICTAL) 25 MG tablet Take 50 mg by mouth 2 (two) times daily.   Yes [provider]  levETIRAcetam (KEPPRA) 1000 MG tablet Take 1 tablet (1,000 mg total) by mouth 2 (two) times daily. 08/23/23  Yes Leroy Sea, MD  Melatonin 10 MG TABS Take 10 mg by mouth at bedtime.   Yes [provider]  nitroGLYCERIN (NITROSTAT) 0.4 MG SL tablet Place 0.4 mg under the tongue 3 (three) times daily as needed for chest pain. Every 5 minutes up to 3 doses 08/24/23  Yes [provider]  pantoprazole (PROTONIX) 40 MG tablet Take 1 tablet (40 mg total) by mouth daily. 08/23/23  Yes Leroy Sea, MD  polyethylene glycol (MIRALAX / GLYCOLAX) 17 g packet Take 17 g by mouth 2 (two) times daily. 08/19/23  Yes Wieting, Richard, MD  pregabalin (LYRICA) 100 MG capsule Take 200 mg by mouth at bedtime.   Yes [provider]  senna (SENOKOT) 8.6 MG TABS tablet Take 2 tablets (17.2 mg total) by mouth at bedtime as needed for mild constipation or moderate constipation. 06/26/23  Yes Sunnie Nielsen, DO  tamsulosin (FLOMAX) 0.4 MG CAPS capsule Take 0.4 mg by mouth every evening.   Yes [provider]  divalproex (DEPAKOTE) 125 MG DR tablet Take 4 tablets (500 mg total) by mouth 2 (two) times daily for 1 day, THEN 4 tablets (500 mg total) daily for 3 days, THEN 2 tablets (250  mg total) daily for 3 days, THEN 1 tablet (125 mg total) daily for 3 days. Patient not taking: Reported on 09/03/2023 08/23/23 09/03/23  Leroy Sea, MD  fluticasone (  FLONASE) 50 MCG/ACT nasal spray Place 2 sprays into both nostrils daily. Patient not taking: Reported on 09/03/2023 05/15/23   [provider]  Mouthwashes (MOUTH RINSE) LIQD solution 15 mLs by Mouth Rinse route every 2 (two) hours. Patient not taking: Reported on 09/03/2023 08/19/23   Alford Highland, MD  pregabalin (LYRICA) 50 MG capsule Take 50 mg capsule twice daily and 100 mg (two capsules) at bedtime Patient not taking: Reported on 09/03/2023 08/23/23   Leroy Sea, MD  tiotropium (SPIRIVA) 18 MCG inhalation capsule Place 18 mcg into inhaler and inhale daily. Patient not taking: Reported on 09/03/2023    [provider]     Critical care time: 40 minutes    Zada Girt, AGNP  Pulmonary/Critical Care Pager [phone] (please enter 7 digits) PCCM Consult Pager [phone] (please enter 7 digits)

## 2023-09-26 NOTE — Progress Notes (Signed)
09/26/2023  Subjective: Patient is 11 Days Post-Op s/p 2nd exploratory laparotomy for fascial dehiscence, 15 days post-op from initial exploratory laparotomy, LOA, and colostomy revision.  No acute events.  Patient did not tolerate SBT well yesterday.  Remains on vent, sedated, but remains off pressors.  On TF at 40/hr and TPN, having good ostomy output.  WBC elevated to 21.1.  Vital signs: Temp:  [97.5 F (36.4 C)-98.5 F (36.9 C)] 98.2 F (36.8 C) (01/22 0400) Pulse Rate:  [73-102] 98 (01/22 0600) Resp:  [18-39] 34 (01/22 0600) BP: (79-137)/(50-91) 112/59 (01/22 0600) SpO2:  [96 %-100 %] 96 % (01/22 0600) FiO2 (%):  [30 %-40 %] 30 % (01/22 0742)   Intake/Output: 01/21 0701 - 01/22 0700 In: 3921.2 [I.V.:2927.1; NG/GT:544.8; IV Piggyback:449.3] Out: 2910 [Urine:2100; Emesis/NG output:135; Stool:675] Last BM Date : 09/25/23  Physical Exam: Constitutional:  Sedated, intubated Abdomen:  soft, does not appear distended or tender.  Midline incision with staples and retention sutures.  There is some mild skin edge separation under the staples between the 2nd and 3rd retention suture, but without any purulence, erythema, or visible tissue underneath.  Retention sutures are otherwise holding well.  New dressing applied.  Labs:  Recent Labs    09/25/23 0435 09/26/23 0535  WBC 15.3* 21.1*  HGB 7.9* 8.1*  HCT 26.8* 27.3*  PLT 267 281   Recent Labs    09/25/23 0435 09/26/23 0535  NA 140 141  K 3.9 3.6  CL 112* 111  CO2 20* 21*  GLUCOSE 138* 179*  BUN 41* 37*  CREATININE 0.93 0.84  CALCIUM 8.0* 8.2*   No results for input(s): "LABPROT", "INR" in the last 72 hours.  Imaging: No results found.  Assessment/Plan: This is a 73 y.o. male s/p exlap, LOA, and colostomy revision, followed by emergent exlap for complete fascial dehiscence, now with partial dehiscence and aspiration pneumonia.  --Patient's abdomen appears stable.  Does not look more distended.  Although there is some  skin edge separation overlying one of the sites of dehiscence, currently it's only mild and there's no visible bowel or tissue underneath.  This may be as a result of the pressure from the partial dehiscence noted on his CT scan last week.   His abdomen is not distended at this point. --Continue TF and can advance as tolerated.  Continue checking residuals intermittently to make sure he's tolerating well.  Would continue TPN until we know he's tolerating full rate without issues. --Continue antibiotics, vent management/weaning per ICU team. --Again would not recommend any surgical intervention for his dehiscence at this point, which still remains contained.  Any surgical intervention would be at high risk of further complications and he currently has very poor reserve.   Howie Ill, MD [facility]

## 2023-09-26 NOTE — Plan of Care (Signed)
  Problem: Elimination: Goal: Will not experience complications related to bowel motility Outcome: Progressing   

## 2023-09-27 DIAGNOSIS — J9601 Acute respiratory failure with hypoxia: Secondary | ICD-10-CM | POA: Diagnosis not present

## 2023-09-27 DIAGNOSIS — Z7189 Other specified counseling: Secondary | ICD-10-CM | POA: Diagnosis not present

## 2023-09-27 LAB — GLUCOSE, CAPILLARY
Glucose-Capillary: 174 mg/dL — ABNORMAL HIGH (ref 70–99)
Glucose-Capillary: 191 mg/dL — ABNORMAL HIGH (ref 70–99)
Glucose-Capillary: 188 mg/dL — ABNORMAL HIGH (ref 70–99)
Glucose-Capillary: 176 mg/dL — ABNORMAL HIGH (ref 70–99)
Glucose-Capillary: 180 mg/dL — ABNORMAL HIGH (ref 70–99)
Glucose-Capillary: 127 mg/dL — ABNORMAL HIGH (ref 70–99)

## 2023-09-27 LAB — BASIC METABOLIC PANEL
Anion gap: 8 (ref 5–15)
BUN: 37 mg/dL — ABNORMAL HIGH (ref 8–23)
CO2: 24 mmol/L (ref 22–32)
Calcium: 8.1 mg/dL — ABNORMAL LOW (ref 8.9–10.3)
Chloride: 113 mmol/L — ABNORMAL HIGH (ref 98–111)
Creatinine, Ser: 1 mg/dL (ref 0.61–1.24)
GFR, Estimated: >60 mL/min (ref >60)
Glucose, Bld: 200 mg/dL — ABNORMAL HIGH (ref 70–99)
Potassium: 4.4 mmol/L (ref 3.5–5.1)
Sodium: 145 mmol/L (ref 135–145)

## 2023-09-27 LAB — CBC WITH DIFFERENTIAL/PLATELET
Abs Immature Granulocytes: 3.96 10*3/uL — ABNORMAL HIGH (ref 0.00–0.07)
Basophils Absolute: 0.1 10*3/uL (ref 0.0–0.1)
Basophils Relative: 1 %
Eosinophils Absolute: 0 10*3/uL (ref 0.0–0.5)
Eosinophils Relative: 0 %
HCT: 24.7 % — ABNORMAL LOW (ref 39.0–52.0)
Hemoglobin: 7.5 g/dL — ABNORMAL LOW (ref 13.0–17.0)
Immature Granulocytes: 22 %
Lymphocytes Relative: 6 %
Lymphs Abs: 1 10*3/uL (ref 0.7–4.0)
MCH: 28.2 pg (ref 26.0–34.0)
MCHC: 30.4 g/dL (ref 30.0–36.0)
MCV: 92.9 fL (ref 80.0–100.0)
Monocytes Absolute: 0.6 10*3/uL (ref 0.1–1.0)
Monocytes Relative: 3 %
Neutro Abs: 12.3 10*3/uL — ABNORMAL HIGH (ref 1.7–7.7)
Neutrophils Relative %: 68 %
Platelets: 279 10*3/uL (ref 150–400)
RBC: 2.66 MIL/uL — ABNORMAL LOW (ref 4.22–5.81)
RDW: 15.7 % — ABNORMAL HIGH (ref 11.5–15.5)
Smear Review: NORMAL
WBC: 17.9 10*3/uL — ABNORMAL HIGH (ref 4.0–10.5)
nRBC: 0.6 % — ABNORMAL HIGH (ref 0.0–0.2)

## 2023-09-27 LAB — BLOOD GAS, VENOUS
Acid-base deficit: 0.8 mmol/L (ref 0.0–2.0)
Bicarbonate: 25.4 mmol/L (ref 20.0–28.0)
O2 Saturation: 79.7 %
Patient temperature: 37
pCO2, Ven: 47 mm[Hg] (ref 44–60)
pH, Ven: 7.34 (ref 7.25–7.43)
pO2, Ven: 48 mm[Hg] — ABNORMAL HIGH (ref 32–45)

## 2023-09-27 LAB — PHOSPHORUS: Phosphorus: 3.2 mg/dL (ref 2.5–4.6)

## 2023-09-27 LAB — TRIGLYCERIDES: Triglycerides: 169 mg/dL — ABNORMAL HIGH (ref <150)

## 2023-09-27 LAB — MAGNESIUM: Magnesium: 2.1 mg/dL (ref 1.7–2.4)

## 2023-09-27 NOTE — Progress Notes (Signed)
09/27/2023  Subjective: Patient is 12 Days Post-Op s/p 2nd exploratory laparotomy for fascial dehiscence, 16 days post-op from initial exploratory laparotomy, LOA, and colostomy revision.  No acute events.  Remains on vent, sedated, but remains off pressors.  On TF at 50/hr which is goal rate, now off TPN.  Having good ostomy output.  KUB yesterday did not show any gas distention.  Vital signs: Temp:  [98.1 F (36.7 C)-98.3 F (36.8 C)] 98.3 F (36.8 C) (01/23 0400) Pulse Rate:  [75-108] 84 (01/23 0600) Resp:  [14-26] 23 (01/23 0600) BP: (93-122)/(53-69) 121/69 (01/23 0600) SpO2:  [94 %-100 %] 97 % (01/23 0600) FiO2 (%):  [30 %] 30 % (01/23 0735)   Intake/Output: 01/22 0701 - 01/23 0700 In: 1985.2 [I.V.:744.2; NG/GT:941.3; IV Piggyback:299.8] Out: 2330 [Urine:1350; Emesis/NG output:130; Stool:850] Last BM Date : 09/26/23  Physical Exam: Constitutional:  Sedated, intubated Abdomen:  soft, does not appear distended or tender.  Midline incision with staples and retention sutures.  There is some mild skin edge separation under the staples between the 2nd and 3rd retention suture, but without any purulence, erythema, and only subcutaneous tissue underneath.  Retention sutures are otherwise holding well.  New dressing applied.  Labs:  Recent Labs    09/26/23 0535 09/27/23 0403  WBC 21.1* 17.9*  HGB 8.1* 7.5*  HCT 27.3* 24.7*  PLT 281 279   Recent Labs    09/26/23 0535 09/27/23 0403  NA 141 145  K 3.6 4.4  CL 111 113*  CO2 21* 24  GLUCOSE 179* 200*  BUN 37* 37*  CREATININE 0.84 1.00  CALCIUM 8.2* 8.1*   No results for input(s): "LABPROT", "INR" in the last 72 hours.  Imaging: DG Abd 1 View Result Date: 09/26/2023 CLINICAL DATA:  Abdominal distention EXAM: ABDOMEN - 1 VIEW COMPARISON:  X-ray 09/24/2023. FINDINGS: Again midline skin staples are seen with retention sutures. Left lower quadrant ostomy. There are some contrast along the decompressed loops of the large bowel in  the left midabdomen and pelvis. Relatively gasless abdomen overall. Enteric tube in place with the tip overlying the body of the stomach. IMPRESSION: Ostomy.  Enteric tube. Postsurgical changes. Near gasless abdomen which is a nonspecific finding. Loops seen opacified with contrast and with the air are nondilated. Additional workup as clinically appropriate. Electronically Signed   By: Karen Kays M.D.   On: 09/26/2023 14:15    Assessment/Plan: This is a 73 y.o. male s/p exlap, LOA, and colostomy revision, followed by emergent exlap for complete fascial dehiscence, now with partial dehiscence and aspiration pneumonia.  --Patient's abdomen appears stable.  Does not look more distended.  Although there is some skin edge separation overlying one of the sites of dehiscence, currently it's only mild and there's no visible bowel underneath.  This may be as a result of the pressure from the partial dehiscence noted on his CT scan last week.   His abdomen is not distended at this point. --Continue TF as tolerated.  Continue checking residuals intermittently to make sure he's tolerating well.   --Continue antibiotics, vent management/weaning per ICU team. --Again would not recommend any surgical intervention for his dehiscence at this point, which still remains contained.  Any surgical intervention would be at high risk of further complications and he currently has very poor reserve.   Calvin Ill, MD [facility]

## 2023-09-27 NOTE — Progress Notes (Signed)
Daily Progress Note   [name]       Date: 09/27/2023 DOB: 23-May-1951  Age: 73 y.o. MRN#: [id number] Attending Physician: Erin Fulling, MD Primary Care Physician: Keane Police, MD Admit Date: 09/03/2023  Reason for Consultation/Follow-up: Establishing goals of care  Subjective: Notes, imaging, and labs reviewed. Patient is currently resting in bed with no family at bedside. Called to speak with daughter. Discussed plans for WUA and SBT tomorrow morning. She states she will be here tomorrow around 9:00.   Length of Stay: 24  Current Medications: Scheduled Meds:   acetaminophen  650 mg Per Tube Q6H   Chlorhexidine Gluconate Cloth  6 each Topical Daily   Chlorhexidine Gluconate Cloth  6 each Topical Q0600   enoxaparin (LOVENOX) injection  40 mg Subcutaneous QHS   feeding supplement (PROSource TF20)  60 mL Per Tube BID   free water  30 mL Per Tube Q4H   insulin aspart  0-15 Units Subcutaneous Q4H   ipratropium-albuterol  3 mL Inhalation TID   lamoTRIgine  50 mg Per Tube BID   methylPREDNISolone (SOLU-MEDROL) injection  20 mg Intravenous Q12H   mupirocin ointment  1 Application Nasal BID   mouth rinse  15 mL Mouth Rinse Q2H   oxyCODONE  5 mg Per Tube Q6H   pantoprazole (PROTONIX) IV  40 mg Intravenous Q12H   polyethylene glycol  17 g Per Tube Daily   pregabalin  100 mg Per Tube Daily   pregabalin  200 mg Per Tube QHS   sodium chloride flush  10-40 mL Intracatheter Q12H   sodium chloride flush  3-10 mL Intravenous Q12H    Continuous Infusions:  feeding supplement (VITAL 1.5 CAL) 50 mL/hr at 09/27/23 0010   levETIRAcetam 1,000 mg (09/27/23 1000)   norepinephrine (LEVOPHED) Adult infusion Stopped (09/26/23 0121)   piperacillin-tazobactam (ZOSYN)  IV  3.375 g (09/27/23 1001)   propofol (DIPRIVAN) infusion 30 mcg/kg/min (09/27/23 1245)    PRN Meds: docusate, HYDROmorphone (DILAUDID) injection, ipratropium-albuterol, midazolam, mouth rinse, senna, sodium chloride flush, sodium chloride flush  Physical Exam Constitutional:      Comments: Eyes closed.   Pulmonary:     Comments: On ventilator.             Vital Signs: BP 132/71   Pulse 86   Temp  98.8 F (37.1 C) (Axillary)   Resp (!) 22   Ht 5' 10.98" (1.803 m)   Wt 93.3 kg   SpO2 99%   BMI 28.70 kg/m  SpO2: SpO2: 99 % O2 Device: O2 Device: Ventilator O2 Flow Rate: O2 Flow Rate (L/min): 2 L/min  Intake/output summary:  Intake/Output Summary (Last 24 hours) at 09/27/2023 1418 Last data filed at 09/27/2023 0510 Gross per 24 hour  Intake 1216.59 ml  Output 1550 ml  Net -333.41 ml   LBM: Last BM Date : 09/26/23 Baseline Weight: Weight: 97.8 kg Most recent weight: Weight:  (unable to get d/t bed malfunction)    Patient Active Problem List   Diagnosis Date Noted   Aspiration into airway 09/21/2023   Small bowel obstruction due to adhesions (HCC) 09/11/2023   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   Cancer of sigmoid (HCC) 09/04/2023   Gout 09/04/2023   Acute respiratory failure with hypoxia (HCC) 09/03/2023   Acute metabolic encephalopathy 08/19/2023   History of CVA (cerebrovascular accident) 08/19/2023   Thrombocytopenia (HCC) 08/19/2023   Hypotension 07/15/2023   Small bowel obstruction (HCC) 07/15/2023   Hemiplegia and 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 side (HCC)    Temporal pain 06/18/2022   Acute kidney injury superimposed on CKD (HCC) 06/17/2022   Dysphagia 06/07/2022   Respiratory distress 06/02/2022   Neuropathy 06/02/2022   Paroxysmal atrial flutter (HCC) 06/01/2022   Aspiration pneumonia (HCC) 06/01/2022   Cervical spinal stenosis 05/31/2022   Chronic diastolic CHF (congestive heart failure) (HCC) 05/28/2022   SBO,  recurrent (small bowel obstruction) (HCC)    Abdominal distention    HLD (hyperlipidemia) 10/16/2021   Nausea vomiting and diarrhea 10/16/2021   Sepsis (HCC) 10/16/2021   Tobacco abuse 10/16/2021   Muscle twitching 08/14/2019   UTI (urinary tract infection) 08/03/2019   Ileus (HCC) 08/03/2019   Hypokalemia 08/03/2019   QT prolongation 08/03/2019   Ogilvie syndrome    Acute abdominal pain 04/03/2019   Left-sided weakness 09/30/2017   Seizure (HCC) 09/30/2017   Essential hypertension 09/30/2017   CAD (coronary artery disease) 09/30/2017   COPD (chronic obstructive pulmonary disease) (HCC) 09/30/2017   Depression with anxiety 09/30/2017    Palliative Care Assessment & Plan     Recommendations/Plan: SBT and WUA planned for tomorrow. Daughter advises she will ne here around 9:00.   Code Status:    Code Status Orders  (From admission, onward)           Start     Ordered   09/03/23 0754  Full code  Continuous       Question:  By:  Answer:  Consent: discussion documented in EHR   09/03/23 0755           Code Status History     Date Active Date Inactive Code Status Order ID Comments User Context   08/19/2023 1314 08/23/2023 1838 Full Code [id number]  Alan Mulder, MD Inpatient   08/18/2023 1448 08/19/2023 1154 Full Code [id number]  Emeline General, MD ED   07/18/2023 1748 07/24/2023 1712 Full Code [id number]  Kathrynn Running, MD Inpatient   07/15/2023 0538 07/18/2023 1551 Full Code [id number]  Andris Baumann, MD ED   06/22/2023 0939 06/27/2023 0108 Full Code [id number]  Emeline General, MD ED   05/28/2022 1622 06/30/2022 2326 Full Code [id number]  Lorretta Harp, MD ED   10/23/2021 2158 11/02/2021 2029 Full Code [id number]  Lajoyce Corners, NP Inpatient   10/16/2021 1144 10/23/2021 2158 DNR [id number]  Lorretta Harp, MD ED   10/02/2021 [phone] 10/02/2021 1641 DNR [id number]  Loleta Rose, MD ED   08/03/2019 2226 08/14/2019 2350 Full Code [id number]  Lurene Shadow, MD Inpatient   04/03/2019  1711 04/08/2019 1731 Full Code [id number]  Auburn Bilberry, MD Inpatient   04/03/2019 1627 04/03/2019 1710 DNR [id number]  Auburn Bilberry, MD ED   09/30/2017 2301 10/02/2017 2039 Full Code [id number]  Oralia Manis, MD Inpatient       Prognosis:  Poor overall   Care plan was discussed with CCM via epic chat.   Thank you for allowing the Palliative Medicine Team to assist in the care of this patient.    Morton Stall, NP  Please contact Palliative Medicine Team phone at [phone] for questions and concerns.

## 2023-09-27 NOTE — Progress Notes (Signed)
NAME:  Calvin [name], MRN:  [id number], DOB:  10/29/1950, LOS: 24 ADMISSION DATE:  09/03/2023, CONSULTATION DATE:  09/21/2023 REFERRING MD:  Manuela Schwartz, NP, CHIEF COMPLAINT:  Acute Respiratory Distress, Aspiration event   Brief Pt Description / Synopsis:  73 y.o. male admitted with PMHx most significant for colon cancer status post transverse colectomy and colostomy, presented with nausea, vomiting, and shortness of breath.  Was admitted with Sepsis due to Influenza A infection, Aspiration pneumonia, and Small Bowel Obstruction.  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  Course further complicated by Aspiration event requiring intubation and mechanical ventilation on 1/17.  History of Present Illness:  Calvin [name] is a 73 y.o. 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   Was admitted by [facility] for further workup and treatment of Was admitted with Sepsis due to Influenza A infection, Aspiration pneumonia, and Small Bowel Obstruction.  Please see "Significant Hospital Events" section below for full detailed hospital course.  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 side (HCC)    Hypertension    Myocardial infarction (HCC)    Neuromuscular disorder  (HCC)    Ogilvie syndrome    Seizures (HCC)    last 6 months ago   Stroke [facility])    multiple  left side weakness   Tremors of nervous system    Unstable angina (HCC)      Micro Data:  12/30: SARS-CoV-2/RSV/Flu PCR>>+ Influenza A 12/30: Blood cultures x2>>negative  12/30: Urine>>Staphylococcus Aureus 12/30: MRSA PCR + 1/17: Tracheal aspirate>>moderate pseudomonas  1/18: MRSA PCR +  Anti-infectives (From admission, onward)    Start     Dose/Rate Route Frequency Ordered Stop   09/23/23 1100  vancomycin (VANCOCIN) IVPB 1000 mg/200 mL premix  Status:  Discontinued        1,000 mg 200 mL/hr over 60 Minutes Intravenous Every 12 hours 09/23/23 0811 09/24/23 1021   09/22/23 2300  vancomycin (VANCOCIN) IVPB 1000 mg/200 mL premix  Status:  Discontinued        1,000 mg 200 mL/hr over 60 Minutes Intravenous Every 24 hours 09/22/23 0819 09/23/23 0811   09/22/23 1000  vancomycin (VANCOREADY) IVPB 1500 mg/300 mL        1,500 mg 150 mL/hr over 120 Minutes Intravenous  Once 09/22/23 0819 09/22/23 1916   09/21/23 0815  piperacillin-tazobactam (ZOSYN) IVPB 3.375 g        3.375 g 12.5 mL/hr over 240 Minutes Intravenous Every 8 hours 09/21/23 0729 09/27/23 2359   09/11/23 2000  cefoTEtan (CEFOTAN) 2 g in sodium chloride 0.9 % 100 mL IVPB        2 g 200 mL/hr over 30 Minutes Intravenous Every 8 hours 09/11/23 1801 09/12/23 1910   09/11/23 0600  cefoTEtan (CEFOTAN) 2 g in sodium chloride 0.9 % 100 mL IVPB        2 g 200 mL/hr over 30 Minutes Intravenous On call to O.R. 09/10/23 0858 09/12/23 1910   09/04/23 1200  Ampicillin-Sulbactam (UNASYN) 3 g in sodium chloride 0.9 % 100 mL IVPB  Status:  Discontinued        3 g 200 mL/hr over 30 Minutes Intravenous Every 6 hours 09/04/23 1052 09/08/23 1537   09/03/23 1800  ceFEPIme (MAXIPIME) 2 g in sodium chloride 0.9 % 100 mL IVPB  Status:  Discontinued        2 g 200 mL/hr over 30 Minutes Intravenous Every 12 hours 09/03/23 0812 09/04/23 0903   09/03/23  1500  metroNIDAZOLE (FLAGYL) IVPB 500 mg  Status:  Discontinued        500 mg 100 mL/hr over 60 Minutes Intravenous Every 12 hours 09/03/23 0758 09/04/23 0903   09/03/23 1000  oseltamivir (TAMIFLU) capsule 30 mg        30 mg Oral 2 times daily 09/03/23 0757 09/08/23 0959   09/03/23 0315  ceFEPIme (MAXIPIME) 2 g in sodium chloride 0.9 % 100 mL IVPB        2 g 200 mL/hr over 30 Minutes Intravenous  Once 09/03/23 0304 09/03/23 0441   09/03/23 0315  metroNIDAZOLE (FLAGYL) IVPB 500 mg        500 mg 100 mL/hr over 60 Minutes Intravenous  Once 09/03/23 0304 09/03/23 0441   09/03/23 0315  vancomycin (VANCOCIN) IVPB 1000 mg/200 mL premix        1,000 mg 200 mL/hr over 60 Minutes Intravenous  Once 09/03/23 0304 09/03/23 0441      Significant Hospital Events: Including procedures, antibiotic start and stop dates in addition to other pertinent events   12/30: admitted to hospitalist sepsis, AKI, (+)Influenza A, likely aspiration pneumonia, acute hypoxic resp fail, SBO. NG placed, surgical consult.  12/31-01/06: relatively stable, remains SBO w/ NG and on TPN, worsening colostomy prolapse. Anemia w/o overt GIB, holding anti-plt/anti-cogs, GI no plans for endoscopic eval at this time 01/07: exlap, LOA, and colostomy revision  01/08: stable.  01/09: d/c NG 01/10: advancing diet as able. Hgb 6.8 - 1 unit PRBC given  01/11: persistent abd pain and cough, imaging concerning for abdominal surgical wound dehiscence deeper layers, going back to OR. Continue w/ cough suppression, await over-read but no obvious CT chest findings, suspect post-viral bronchitis  01/12: to OR early AM for closure of wound dehiscence. Postop stable, NG in place. CT chest question pneumonia but pt reports coughing improved, suspect was mostly reflux, will continue to monitor cough. Postop care per general surgery include clamp NG and remain NPO for now   01/13: NG out, CLD 01/14: FLD, continue advancing diet tolerated  1/15:  Advanced to dysphagia diet after ST eval.  Discontinue telemetry, palliative care consult, DC central line 1/16: Underwent CT abdomen/pelvis last evening.  Not a candidate for another surgery due to poor functional and nutritional status and multiple recent procedures.  Family deciding on goals of care/hospice 1/17: Aspirated this morning with development of severe respiratory distress and hypoxia requiring emergent intubation.  Now with developing septic shock.  Pt's daughter requesting transfer to [facility] or [facility].  Dr. Aleen Campi spoke both facilities which are at capacity and not accepting transfers. 1/18: Pt remains mechanically intubated overnight required increased dose of fentanyl and vecuronium x1 dose due to vent dyssynchrony.  Pt currently unable to follow commands RASS goal -  4.  Will perform WUA and for possible SBT.  Pt requiring levophed gtt @5  mcg/min and vasopressin 0.03 units/min to maintain map 65 or higher.  Pt extubated to Bipap able to follow commands  1/19: Pt with increased work of breathing due to increased secretions despite scheduled hypertonic saline, Chest PT, and Bipap requiring reintubation  1/20: On minimal vent settings, will keep intubated today as required reintubation yesterday.  Off Vasopressors. KUB with improving ileus, General Surgery ok with starting trickle feeds 1/21: On minimal vent settings.  Daughter at bedside for SBT, FAILED SBT due to tachypnea and increased WOB. 1/22: Pt remains mechanically intubated on PRVC vent settings PEEP 5/FiO2 30% with increased work of breathing; vbg revealed metabolic acidosis requiring 2 amps of bicarb  1/23: Pt remains mechanically intubated on minimal settings.  Tolerating TF's.  Will continue to optimize respiratory status today and plan for SBT on 01/24 with daughter at bedside   Interim History / Subjective:  As outlined above under significant events   Objective   Blood pressure 121/69, pulse 84, temperature 98.3 F (36.8 C),  temperature source Axillary, resp. rate (!) 23, height 5' 10.98" (1.803 m), weight 93.3 kg, SpO2 97%.    Vent Mode: PRVC FiO2 (%):  [30 %] 30 % Set Rate:  [18 bmp] 18 bmp Vt Set:  [500 mL] 500 mL PEEP:  [5 cmH20] 5 cmH20 Plateau Pressure:  [15 cmH20-26 cmH20] 18 cmH20   Intake/Output Summary (Last 24 hours) at 09/27/2023 2440 Last data filed at 09/27/2023 0510 Gross per 24 hour  Intake 1985.22 ml  Output 2330 ml  Net -344.78 ml   Filed Weights   09/20/23 0500 09/23/23 0400 09/24/23 0457  Weight: 92.2 kg 93.3 kg 93.3 kg   Examination: General: Acute on chronically-ill appearing male, mechanically intubated with mild respiratory distress  HENT: Atraumatic, normocephalic, neck supple, orally intubated Lungs: Rhonchi throughout, tachypneic with accessory muscle use  Cardiovascular: NSR, s1s2, no m/r/g, 2+ radial/1+ distal pulses, no edema Abdomen: +BS x4, soft, non distended, colostomy and mucous fistula in left upper quadrant dressing dry/intact, stool in bag dressing dry/intact Extremities: Normal bulk and tone, no deformities Neuro: Sedated, following some commands, bilateral pupils pinpoint and sluggish  GU: External catheter draining yellow urine   Resolved Hospital Problem list     Assessment & Plan:   #Acute metabolic encephalopathy #Mechanical intubation pain/discomfort  Hx: Seizures  - Maintain a RASS goal of 0 to -1 - PAD protocol to maintain RASS goal: propofol gtt and prn dilaudid  - Avoid sedating medications as able - Continue scheduled baclofen  - Daily wake up assessment - Continue lamictal and keppra - Seizure precautions   #Acute hypoxic & hypercapnic respiratory failure #Large volume aspiration #COPD without acute exacerbation #Influenza A infection (present on admission) - Full vent support for now  - Continue lung protective strategies - Maintain plateau pressures less than 30 cm H20 - Wean FiO2 & PEEP as tolerated to maintain O2 sats >92% - Follow  intermittent CXR & ABG as needed - SBT once respiratory parameters met and mental status permits - Implement VAP Bundle - Scheduled and prn bronchodilator therapy  - May require tracheostomy of this aligns with goals of care   #Shock: suspect septic +/- hypovolemic ~ RESOLVED #Chronic HFpEF Echocardiogram 09/22/23: EF 50%; left ventricle demonstrates global hypokinesis; mildly elevated pulmonary artery systolic pressure; large pleural effusion in the left lateral region; trivial mitral valve regurgitation; trivial aortic valve regurgitation  - Continuous telemetry monitoring -  Trend lactic acid until normalized - Troponin peaked at 25 - Diuresis as BP and renal function permits  #AKI on CKD stage II~RESOLVED #BPH - Trend BMP - Replace electrolytes as indicated  - Ensure adequate renal perfusion - Avoid nephrotoxic agents as able - Replace electrolytes as indicated ~ Pharmacy following for assistance with electrolyte replacement  #Aspiration pneumonia #Pseudomonas pneumonia  #Influenza Infection (POA)~TREATED - Trend WBC and monitor fever curve - Trend PCT   - Follow cultures as above - Continue zosyn  (plan for 7 day course)  #Small bowel obstruction vs ileus #Colostomy irrigation/prolapse #Hx: ogilvie syndrome s/p colostomy - NPO; OGT to LIS - General Surgery following, appreciate input: continue TF's   #Anemia - Trend CBC  - Monitor for s/sx of bleeding  - Transfuse for hgb <7   #Prediabetes - CBG's q4hrs - Target range of 140 to 180 - SSI - Follow ICU Hypo/Hyperglycemia protocol  Patient is critically ill with shock and multiorgan failure.  Prognosis is guarded, high risk for further decompensation, cardiac arrest and death.  Given current critical illness superimposed on multiple chronic comorbidities including colon cancer, along with advanced age, overall long-term prognosis is poor.  Recommend consideration for DNR status.  Palliative care is following for  ongoing goals of care discussions. Best Practice (right click and "Reselect all SmartList Selections" daily)   Diet/type: TF's DVT prophylaxis: Lovenox  GI prophylaxis: PPI Lines: Central line and yes and it is still needed  Foley:  N/A  Code Status:  full code Last date of multidisciplinary goals of care discussion [09/27/23]  09/27/23: Will update pts daughter when she arrives at bedside   Labs   CBC: Recent Labs  Lab 09/23/23 0411 09/24/23 0436 09/25/23 0435 09/26/23 0535 09/27/23 0403  WBC 16.9* 18.0* 15.3* 21.1* 17.9*  NEUTROABS  --   --   --   --  12.3*  HGB 8.0* 7.7* 7.9* 8.1* 7.5*  HCT 26.5* 25.4* 26.8* 27.3* 24.7*  MCV 95.7 93.7 93.4 95.5 92.9  PLT 212 248 267 281 279    Basic Metabolic Panel: Recent Labs  Lab 09/23/23 0411 09/24/23 0436 09/24/23 1443 09/25/23 0435 09/25/23 1652 09/26/23 0535 09/27/23 0403  NA 137 140  --  140  --  141 145  K 4.3 3.7  --  3.9  --  3.6 4.4  CL 104 108  --  112*  --  111 113*  CO2 23 23  --  20*  --  21* 24  GLUCOSE 182* 133*  --  138*  --  179* 200*  BUN 18 29*  --  41*  --  37* 37*  CREATININE 0.97 0.95  --  0.93  --  0.84 1.00  CALCIUM 7.6* 8.1*  --  8.0*  --  8.2* 8.1*  MG 2.4 2.4 2.5* 2.1 2.1 2.0 2.1  PHOS 2.5 1.7*  1.7* 4.5 3.2 5.3* 2.3* 3.2   GFR: Estimated Creatinine Clearance: 75.6 mL/min (by C-G formula based on SCr of 1 mg/dL). Recent Labs  Lab 09/21/23 0746 09/21/23 1007 09/22/23 0526 09/22/23 0958 09/23/23 0411 09/24/23 0436 09/25/23 0435 09/26/23 0535 09/27/23 0403  WBC 16.8*  --    < >  --    < > 18.0* 15.3* 21.1* 17.9*  LATICACIDVEN 1.2 2.0*  --  1.1  --   --   --   --   --    < > = values in this interval not displayed.    Liver Function Tests:  Recent Labs  Lab 09/21/23 0746 09/22/23 0526 09/23/23 0411 09/24/23 0436 09/25/23 0435 09/26/23 0535  AST 38  --   --  12*  --   --   ALT 48*  --   --  16  --   --   ALKPHOS 133*  --   --  74  --   --   BILITOT 0.8  --   --  0.6  --   --    PROT 7.0  --   --  6.1*  --   --   ALBUMIN 2.6* 2.1* 2.0* 2.0* 1.8* 1.9*   No results for input(s): "LIPASE", "AMYLASE" in the last 168 hours. No results for input(s): "AMMONIA" in the last 168 hours.  ABG    Component Value Date/Time   PHART 7.33 (L) 09/23/2023 0946   PCO2ART 53 (H) 09/23/2023 0946   PO2ART 73 (L) 09/23/2023 0946   HCO3 21.5 09/26/2023 0755   ACIDBASEDEF 6.0 (H) 09/26/2023 0755   O2SAT 74.4 09/26/2023 0755     Coagulation Profile: No results for input(s): "INR", "PROTIME" in the last 168 hours.  Cardiac Enzymes: No results for input(s): "CKTOTAL", "CKMB", "CKMBINDEX", "TROPONINI" in the last 168 hours.  HbA1C: Hgb A1c MFr Bld  Date/Time Value Ref Range Status  05/28/2022 06:17 PM 5.8 (H) 4.8 - 5.6 % Final    Comment:    (NOTE) Pre diabetes:          5.7%-6.4%  Diabetes:              >6.4%  Glycemic control for   <7.0% adults with diabetes   08/09/2019 07:31 AM 5.7 (H) 4.8 - 5.6 % Final    Comment:    (NOTE) Pre diabetes:          5.7%-6.4% Diabetes:              >6.4% Glycemic control for   <7.0% adults with diabetes     CBG: Recent Labs  Lab 09/26/23 1135 09/26/23 1752 09/26/23 1938 09/26/23 2310 09/27/23 0401  GLUCAP 139* 186* 174* 155* 174*    Review of Systems:   Unable to assess due to Intubation/sedation/critical illness  Past Medical History:  He,  has a past medical history of Alcohol abuse, Anemia, Anxiety, Arthritis, Cancer (HCC), COPD (chronic obstructive pulmonary disease) (HCC), Coronary artery disease, Depression, Gout, Hemiplegia and 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 side (HCC), Hypertension, Myocardial infarction (HCC), Neuromuscular disorder (HCC), Ogilvie syndrome, Seizures (HCC), Stroke (HCC), Tremors of nervous system, and Unstable angina (HCC).   Surgical History:   Past Surgical History:  Procedure Laterality Date   CARPAL TUNNEL RELEASE Left 10/19/2015   Procedure: CARPAL  TUNNEL RELEASE;  Surgeon: Kennedy Bucker, MD;  Location: [facility];  Service: Orthopedics;  Laterality: Left;   COLONOSCOPY WITH PROPOFOL N/A 10/26/2021   Procedure: COLONOSCOPY WITH PROPOFOL;  Surgeon: Regis Bill, MD;  Location: [facility];  Service: Endoscopy;  Laterality: N/A;   COLONOSCOPY WITH PROPOFOL N/A 06/02/2022   Procedure: COLONOSCOPY WITH PROPOFOL;  Surgeon: Regis Bill, MD;  Location: [facility];  Service: Endoscopy;  Laterality: N/A;   COLONOSCOPY WITH PROPOFOL N/A 06/06/2022   Procedure: COLONOSCOPY WITH PROPOFOL;  Surgeon: Regis Bill, MD;  Location: [facility];  Service: Endoscopy;  Laterality: N/A;   COLOSTOMY REVISION N/A 09/11/2023   Procedure: COLOSTOMY REVISION;  Surgeon: Henrene Dodge, MD;  Location: [facility];  Service: General;  Laterality: N/A;   JOINT REPLACEMENT  left partial hip    LAPAROSCOPIC SIGMOID COLECTOMY  09/12/2010   Lap hand assisted sigmoidectomy, mobilization splenic flexure -- Dr. Freida Busman   LAPAROTOMY N/A 09/11/2023   Procedure: EXPLORATORY LAPAROTOMY;  Surgeon: Henrene Dodge, MD;  Location: [facility];  Service: General;  Laterality: N/A;   LAPAROTOMY N/A 09/15/2023   Procedure: EXPLORATORY LAPAROTOMY;  Surgeon: Sung Amabile, DO;  Location: [facility];  Service: General;  Laterality: N/A;   LYSIS OF ADHESION N/A 09/11/2023   Procedure: LYSIS OF ADHESION;  Surgeon: Henrene Dodge, MD;  Location: [facility];  Service: General;  Laterality: N/A;   THROAT SURGERY  09/05/2011   cancer     Social History:   reports that he has quit smoking. His smoking use included cigarettes. He has never used smokeless tobacco. He reports that he does not currently use alcohol. He reports that he does not use drugs.   Family History:  His family history includes Cancer in his mother; Hypertension in his father; Leukemia in his brother.   Allergies No Known Allergies   Home Medications  Prior to Admission medications   Medication Sig Start  Date End Date Taking? Authorizing Provider  aspirin 81 MG chewable tablet Take 81 mg by mouth daily.   Yes [provider]  baclofen (LIORESAL) 5 mg TABS tablet Take 5 mg by mouth 2 (two) times daily.   Yes [provider]  diazePAM, 20 MG Dose, (VALTOCO 20 MG DOSE) 2 x 10 MG/0.1ML LQPK Place 20 mg into the nose as needed (seizure lasting over 2 minutes). 08/23/23  Yes Leroy Sea, MD  ipratropium-albuterol (DUONEB) 0.5-2.5 (3) MG/3ML SOLN Inhale 3 mLs into the lungs every 6 (six) hours as needed. 09/02/23  Yes [provider]  lactulose (CHRONULAC) 10 GM/15ML solution Take 20 g by mouth daily. 08/15/23  Yes [provider]  lamoTRIgine (LAMICTAL) 25 MG tablet Take 50 mg by mouth 2 (two) times daily.   Yes [provider]  levETIRAcetam (KEPPRA) 1000 MG tablet Take 1 tablet (1,000 mg total) by mouth 2 (two) times daily. 08/23/23  Yes Leroy Sea, MD  Melatonin 10 MG TABS Take 10 mg by mouth at bedtime.   Yes [provider]  nitroGLYCERIN (NITROSTAT) 0.4 MG SL tablet Place 0.4 mg under the tongue 3 (three) times daily as needed for chest pain. Every 5 minutes up to 3 doses 08/24/23  Yes [provider]  pantoprazole (PROTONIX) 40 MG tablet Take 1 tablet (40 mg total) by mouth daily. 08/23/23  Yes Leroy Sea, MD  polyethylene glycol (MIRALAX / GLYCOLAX) 17 g packet Take 17 g by mouth 2 (two) times daily. 08/19/23  Yes Wieting, Richard, MD  pregabalin (LYRICA) 100 MG capsule Take 200 mg by mouth at bedtime.   Yes [provider]  senna (SENOKOT) 8.6 MG TABS tablet Take 2 tablets (17.2 mg total) by mouth at bedtime as needed for mild constipation or moderate constipation. 06/26/23  Yes Sunnie Nielsen, DO  tamsulosin (FLOMAX) 0.4 MG CAPS capsule Take 0.4 mg by mouth every evening.   Yes [provider]  divalproex (DEPAKOTE) 125 MG DR tablet Take 4 tablets (500 mg total) by mouth 2 (two) times daily for  1 day, THEN 4 tablets (500 mg total) daily for 3 days, THEN 2 tablets (250 mg total) daily for 3 days, THEN 1 tablet (125 mg total) daily for 3 days. Patient not taking: Reported on 09/03/2023 08/23/23 09/03/23  Leroy Sea, MD  fluticasone (  FLONASE) 50 MCG/ACT nasal spray Place 2 sprays into both nostrils daily. Patient not taking: Reported on 09/03/2023 05/15/23   [provider]  Mouthwashes (MOUTH RINSE) LIQD solution 15 mLs by Mouth Rinse route every 2 (two) hours. Patient not taking: Reported on 09/03/2023 08/19/23   Alford Highland, MD  pregabalin (LYRICA) 50 MG capsule Take 50 mg capsule twice daily and 100 mg (two capsules) at bedtime Patient not taking: Reported on 09/03/2023 08/23/23   Leroy Sea, MD  tiotropium (SPIRIVA) 18 MCG inhalation capsule Place 18 mcg into inhaler and inhale daily. Patient not taking: Reported on 09/03/2023    [provider]     Critical care time: 35 minutes    Zada Girt, AGNP  Pulmonary/Critical Care Pager [phone] (please enter 7 digits) PCCM Consult Pager [phone] (please enter 7 digits)

## 2023-09-27 NOTE — Plan of Care (Signed)
  Problem: Clinical Measurements: Goal: Diagnostic test results will improve Outcome: Progressing Goal: Respiratory complications will improve Outcome: Progressing Goal: Cardiovascular complication will be avoided Outcome: Progressing   Problem: Activity: Goal: Risk for activity intolerance will decrease Outcome: Progressing   Problem: Elimination: Goal: Will not experience complications related to bowel motility Outcome: Progressing Goal: Will not experience complications related to urinary retention Outcome: Progressing Note: Monitor signs and symptoms of urinary retention   Problem: Pain Management: Goal: General experience of comfort will improve Outcome: Progressing   Problem: Respiratory: Goal: Ability to maintain a clear airway will improve Outcome: Progressing Note: Monitor amount and/or characteristics of sputum Monitor pulmonary status Goal: Levels of oxygenation will improve Outcome: Progressing

## 2023-09-27 NOTE — Progress Notes (Signed)
Progress Note   Updated pts brother and sister in law at bedside regarding pts condition and current plan of care.  Informed them both we will plan on performing a WUA and SBT on 01/24 in the am.  All questions were answered, will continue to monitor and assess pt.  Zada Girt, AGNP  Pulmonary/Critical Care Pager [phone] (please enter 7 digits) PCCM Consult Pager [phone] (please enter 7 digits)

## 2023-09-27 NOTE — Progress Notes (Signed)
Notified pts daughter Georgina Quint via telephone to arrive at pts bedside at 09:00 am during which the PCCM team will perform a WUA and SBT.  Ms. Yetta Barre stated she will arrive at pts bedside at 09:00 am tomorrow.  Zada Girt, AGNP  Pulmonary/Critical Care Pager [phone] (please enter 7 digits) PCCM Consult Pager [phone] (please enter 7 digits)

## 2023-09-28 DIAGNOSIS — J9601 Acute respiratory failure with hypoxia: Secondary | ICD-10-CM | POA: Diagnosis not present

## 2023-09-28 DIAGNOSIS — Z7189 Other specified counseling: Secondary | ICD-10-CM | POA: Diagnosis not present

## 2023-09-28 LAB — CBC WITH DIFFERENTIAL/PLATELET
Abs Immature Granulocytes: 4.12 10*3/uL — ABNORMAL HIGH (ref 0.00–0.07)
Basophils Absolute: 0.1 10*3/uL (ref 0.0–0.1)
Basophils Relative: 1 %
Eosinophils Absolute: 0 10*3/uL (ref 0.0–0.5)
Eosinophils Relative: 0 %
HCT: 26.7 % — ABNORMAL LOW (ref 39.0–52.0)
Hemoglobin: 7.9 g/dL — ABNORMAL LOW (ref 13.0–17.0)
Immature Granulocytes: 28 %
Lymphocytes Relative: 9 %
Lymphs Abs: 1.4 10*3/uL (ref 0.7–4.0)
MCH: 28.1 pg (ref 26.0–34.0)
MCHC: 29.6 g/dL — ABNORMAL LOW (ref 30.0–36.0)
MCV: 95 fL (ref 80.0–100.0)
Monocytes Absolute: 1 10*3/uL (ref 0.1–1.0)
Monocytes Relative: 7 %
Neutro Abs: 8.4 10*3/uL — ABNORMAL HIGH (ref 1.7–7.7)
Neutrophils Relative %: 55 %
Platelets: 345 10*3/uL (ref 150–400)
RBC: 2.81 MIL/uL — ABNORMAL LOW (ref 4.22–5.81)
RDW: 15.9 % — ABNORMAL HIGH (ref 11.5–15.5)
Smear Review: NORMAL
WBC: 14.9 10*3/uL — ABNORMAL HIGH (ref 4.0–10.5)
nRBC: 0.9 % — ABNORMAL HIGH (ref 0.0–0.2)

## 2023-09-28 LAB — BASIC METABOLIC PANEL
Anion gap: 11 (ref 5–15)
BUN: 38 mg/dL — ABNORMAL HIGH (ref 8–23)
CO2: 23 mmol/L (ref 22–32)
Calcium: 8.2 mg/dL — ABNORMAL LOW (ref 8.9–10.3)
Chloride: 112 mmol/L — ABNORMAL HIGH (ref 98–111)
Creatinine, Ser: 0.93 mg/dL (ref 0.61–1.24)
GFR, Estimated: >60 mL/min (ref >60)
Glucose, Bld: 177 mg/dL — ABNORMAL HIGH (ref 70–99)
Potassium: 4.7 mmol/L (ref 3.5–5.1)
Sodium: 146 mmol/L — ABNORMAL HIGH (ref 135–145)

## 2023-09-28 LAB — GLUCOSE, CAPILLARY
Glucose-Capillary: 127 mg/dL — ABNORMAL HIGH (ref 70–99)
Glucose-Capillary: 162 mg/dL — ABNORMAL HIGH (ref 70–99)
Glucose-Capillary: 75 mg/dL (ref 70–99)
Glucose-Capillary: 91 mg/dL (ref 70–99)
Glucose-Capillary: 94 mg/dL (ref 70–99)
Glucose-Capillary: 98 mg/dL (ref 70–99)

## 2023-09-28 LAB — MAGNESIUM: Magnesium: 2.3 mg/dL (ref 1.7–2.4)

## 2023-09-28 LAB — PHOSPHORUS: Phosphorus: 3.1 mg/dL (ref 2.5–4.6)

## 2023-09-28 MED ORDER — ORAL CARE MOUTH RINSE
15.0000 mL | OROMUCOSAL | Status: DC
Start: 2023-09-28 — End: 2023-09-29
  Administered 2023-09-28 – 2023-09-29 (×5): 15 mL via OROMUCOSAL

## 2023-09-28 MED ORDER — FENTANYL CITRATE PF 50 MCG/ML IJ SOSY
12.5000 ug | PREFILLED_SYRINGE | INTRAMUSCULAR | Status: DC | PRN
  Administered 2023-09-28: 25 ug via INTRAVENOUS
  Administered 2023-09-28 (×3): 12.5 ug via INTRAVENOUS
  Administered 2023-09-29: 25 ug via INTRAVENOUS
  Filled 2023-09-28 (×6): qty 1

## 2023-09-28 MED ORDER — FENTANYL CITRATE PF 50 MCG/ML IJ SOSY
25.0000 ug | PREFILLED_SYRINGE | Freq: Once | INTRAMUSCULAR | Status: AC
  Administered 2023-09-28: 25 ug via INTRAVENOUS

## 2023-09-28 MED ORDER — ORAL CARE MOUTH RINSE: 15.0000 mL | OROMUCOSAL | Status: DC | PRN

## 2023-09-28 MED ORDER — SODIUM CHLORIDE 3 % IN NEBU
4.0000 mL | INHALATION_SOLUTION | Freq: Two times a day (BID) | RESPIRATORY_TRACT | Status: DC
  Administered 2023-09-28 – 2023-09-30 (×4): 4 mL via RESPIRATORY_TRACT
  Filled 2023-09-28 (×5): qty 4

## 2023-09-28 MED ORDER — ACETAMINOPHEN 10 MG/ML IV SOLN
1000.0000 mg | Freq: Four times a day (QID) | INTRAVENOUS | Status: AC
  Administered 2023-09-28 – 2023-09-29 (×4): 1000 mg via INTRAVENOUS
  Filled 2023-09-28 (×4): qty 100

## 2023-09-28 NOTE — Progress Notes (Signed)
Daily Progress Note   [name]       Date: 09/28/2023 DOB: 1950/10/20  Age: 73 y.o. MRN#: [id number] Attending Physician: Erin Fulling, MD Primary Care Physician: Keane Police, MD Admit Date: 09/03/2023  Reason for Consultation/Follow-up: Establishing goals of care  Subjective: Notes and labs reviewed.  Plans for wake up assessment and SBT this morning at 9.  9:45 AM, daughter is not yet at bedside.    Upon follow-up with CCM, patient was extubated to nasal cannula.  Per conversation plans set to reintubate and trach patient if he fails extubation.   Length of Stay: 25  Current Medications: Scheduled Meds:   acetaminophen  650 mg Per Tube Q6H   Chlorhexidine Gluconate Cloth  6 each Topical Daily   enoxaparin (LOVENOX) injection  40 mg Subcutaneous QHS   feeding supplement (PROSource TF20)  60 mL Per Tube BID   free water  30 mL Per Tube Q4H   insulin aspart  0-15 Units Subcutaneous Q4H   ipratropium-albuterol  3 mL Inhalation TID   lamoTRIgine  50 mg Per Tube BID   methylPREDNISolone (SOLU-MEDROL) injection  20 mg Intravenous Q12H   mupirocin ointment  1 Application Nasal BID   mouth rinse  15 mL Mouth Rinse Q2H   oxyCODONE  5 mg Per Tube Q6H   pantoprazole (PROTONIX) IV  40 mg Intravenous Q12H   polyethylene glycol  17 g Per Tube Daily   pregabalin  100 mg Per Tube Daily   pregabalin  200 mg Per Tube QHS   sodium chloride flush  10-40 mL Intracatheter Q12H   sodium chloride flush  3-10 mL Intravenous Q12H    Continuous Infusions:  feeding supplement (VITAL 1.5 CAL) 1,000 mL (09/28/23 0547)   levETIRAcetam 1,000 mg (09/28/23 0941)   norepinephrine (LEVOPHED) Adult infusion Stopped (09/26/23 0121)   propofol (DIPRIVAN) infusion 30 mcg/kg/min  (09/28/23 0402)    PRN Meds: docusate, HYDROmorphone (DILAUDID) injection, ipratropium-albuterol, midazolam, mouth rinse, senna, sodium chloride flush, sodium chloride flush  Physical Exam Constitutional:      Comments: Eyes closed  Pulmonary:     Comments: On ventilator            Vital Signs: BP 120/75   Pulse 72   Temp 98.7 F (37.1 C) (Axillary)   Resp Marland Kitchen)  21   Ht 5' 10.98" (1.803 m)   Wt 90.9 kg   SpO2 97%   BMI 27.96 kg/m  SpO2: SpO2: 97 % O2 Device: O2 Device: Ventilator O2 Flow Rate: O2 Flow Rate (L/min): 2 L/min  Intake/output summary:  Intake/Output Summary (Last 24 hours) at 09/28/2023 1226 Last data filed at 09/28/2023 0737 Gross per 24 hour  Intake 1966.49 ml  Output 2425 ml  Net -458.51 ml   LBM: Last BM Date : 09/26/23 Baseline Weight: Weight: 97.8 kg Most recent weight: Weight: 90.9 kg   Patient Active Problem List   Diagnosis Date Noted   Aspiration into airway 09/21/2023   Small bowel obstruction due to adhesions (HCC) 09/11/2023   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   Cancer of sigmoid (HCC) 09/04/2023   Gout 09/04/2023   Acute respiratory failure with hypoxia (HCC) 09/03/2023   Acute metabolic encephalopathy 08/19/2023   History of CVA (cerebrovascular accident) 08/19/2023   Thrombocytopenia (HCC) 08/19/2023   Hypotension 07/15/2023   Small bowel obstruction (HCC) 07/15/2023   Hemiplegia and 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 side (HCC)    Temporal pain 06/18/2022   Acute kidney injury superimposed on CKD (HCC) 06/17/2022   Dysphagia 06/07/2022   Respiratory distress 06/02/2022   Neuropathy 06/02/2022   Paroxysmal atrial flutter (HCC) 06/01/2022   Aspiration pneumonia (HCC) 06/01/2022   Cervical spinal stenosis 05/31/2022   Chronic diastolic CHF (congestive heart failure) (HCC) 05/28/2022   SBO, recurrent (small bowel obstruction) (HCC)    Abdominal distention    HLD (hyperlipidemia) 10/16/2021    Nausea vomiting and diarrhea 10/16/2021   Sepsis (HCC) 10/16/2021   Tobacco abuse 10/16/2021   Muscle twitching 08/14/2019   UTI (urinary tract infection) 08/03/2019   Ileus (HCC) 08/03/2019   Hypokalemia 08/03/2019   QT prolongation 08/03/2019   Ogilvie syndrome    Acute abdominal pain 04/03/2019   Left-sided weakness 09/30/2017   Seizure (HCC) 09/30/2017   Essential hypertension 09/30/2017   CAD (coronary artery disease) 09/30/2017   COPD (chronic obstructive pulmonary disease) (HCC) 09/30/2017   Depression with anxiety 09/30/2017    Palliative Care Assessment & Plan     Recommendations/Plan: Patient extubated this morning.  Plans established to reintubate patient if he fails extubation and proceed with tracheostomy.   Code Status:    Code Status Orders  (From admission, onward)           Start     Ordered   09/03/23 0754  Full code  Continuous       Question:  By:  Answer:  Consent: discussion documented in EHR   09/03/23 0755           Code Status History     Date Active Date Inactive Code Status Order ID Comments User Context   08/19/2023 1314 08/23/2023 1838 Full Code [id number]  Alan Mulder, MD Inpatient   08/18/2023 1448 08/19/2023 1154 Full Code [id number]  Emeline General, MD ED   07/18/2023 1748 07/24/2023 1712 Full Code [id number]  Kathrynn Running, MD Inpatient   07/15/2023 0538 07/18/2023 1551 Full Code [id number]  Andris Baumann, MD ED   06/22/2023 0939 06/27/2023 0108 Full Code [id number]  Emeline General, MD ED   05/28/2022 1622 06/30/2022 2326 Full Code [id number]  Lorretta Harp, MD ED   10/23/2021 2158 11/02/2021 2029 Full Code [id number]  Lajoyce Corners, NP Inpatient   10/16/2021 1144 10/23/2021 2158 DNR [id number]  Lorretta Harp, MD ED   10/02/2021 726 008 1723 10/02/2021 1641  DNR [id number]  Loleta Rose, MD ED   08/03/2019 2226 08/14/2019 2350 Full Code [id number]  Lurene Shadow, MD Inpatient   04/03/2019 1711 04/08/2019 1731 Full Code [id number]  Auburn Bilberry, MD  Inpatient   04/03/2019 1627 04/03/2019 1710 DNR [id number]  Auburn Bilberry, MD ED   09/30/2017 2301 10/02/2017 2039 Full Code [id number]  Oralia Manis, MD Inpatient       Thank you for allowing the Palliative Medicine Team to assist in the care of this patient.    Morton Stall, NP  Please contact Palliative Medicine Team phone at [phone] for questions and concerns.

## 2023-09-28 NOTE — Progress Notes (Signed)
[facility] SURGICAL PROGRESS NOTE  Hospital Day(s): 25.   Post op day(s): 13 Days Post-Op.   Interval History:  Patient seen and examined Remains intubated Leukocytosis is improving; 14.9 this AM Hgb to 7.9; stable  Renal function normal; sCr - 0.93; UO - 1100 ccs Colostomy with gas and stool in bag; 375 ccs  He is on tube feedings; held for SBT this AM  Vital signs in last 24 hours: [min-max] current  Temp:  [97.5 F (36.4 C)-99 F (37.2 C)] 97.9 F (36.6 C) (01/24 0737) Pulse Rate:  [61-88] 61 (01/24 0700) Resp:  [15-29] 15 (01/24 0700) BP: (105-132)/(60-75) 105/60 (01/24 0700) SpO2:  [98 %-100 %] 98 % (01/24 0700) FiO2 (%):  [28 %-30 %] 28 % (01/24 0756) Weight:  [90.9 kg] 90.9 kg (01/24 0500)     Height: 5' 10.98" (180.3 cm) Weight: 90.9 kg BMI (Calculated): 27.96   Intake/Output last 2 shifts:  01/23 0701 - 01/24 0700 In: 1966.5 [I.V.:385; NG/GT:1231.5; IV Piggyback:350] Out: 2125 [Urine:1100; Emesis/NG output:650; Stool:375]   Physical Exam:  Constitutional: Intubated; sedated HEENT: NGT in place; tube feedings held Respiratory: Intubated; on ventilator Cardiovascular: regular rate at 60 bpm this AM Gastrointestinal: soft, non-distended 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 Integumentary: Laparotomy is CDI with staples and retention sutures, small opening to superior aspect of the wound, no visible bowel, serous drainage present on dressing; changed  Labs:     Latest Ref Rng & Units 09/28/2023    3:08 AM 09/27/2023    4:03 AM 09/26/2023    5:35 AM  CBC  WBC 4.0 - 10.5 K/uL 14.9  17.9  21.1   Hemoglobin 13.0 - 17.0 g/dL 7.9  7.5  8.1   Hematocrit 39.0 - 52.0 % 26.7  24.7  27.3   Platelets 150 - 400 K/uL 345  279  281       Latest Ref Rng & Units 09/28/2023    3:08 AM 09/27/2023    4:03 AM 09/26/2023    5:35 AM  CMP  Glucose 70 - 99 mg/dL 086  578  469   BUN 8 - 23 mg/dL 38  37  37   Creatinine 0.61 - 1.24 mg/dL  6.29  5.28  4.13   Sodium 135 - 145 mmol/L 146  145  141   Potassium 3.5 - 5.1 mmol/L 4.7  4.4  3.6   Chloride 98 - 111 mmol/L 112  113  111   CO2 22 - 32 mmol/L 23  24  21    Calcium 8.9 - 10.3 mg/dL 8.2  8.1  8.2     Imaging studies: No new pertinent imaging studies   Assessment/Plan: 73 y.o. male with continued respiratory failure requiring intubation 13 Days Post-Op closure of wound dehiscence with rentention sutures s/p initial exploratory laparotomy, LOA, and colostomy revision on 01/07   - Appreciate PCCM support; ventilator management per their service  - Continue tube feedings as tolerated; held this AM for SBT  - Monitor abdominal examination  - If sedated/intubated, we can hold on abdominal binder as he is relaxed now  - Again would not recommend any surgical intervention for his dehiscence at this point, which still remains contained. Any surgical intervention would be at high risk of further complications and he currently has very poor reserve.   - Wound care: Cover with superficial gauze and ABD pad as needed. Change daily and as needed - anticipate heavy serous output  from this, change as needed   - Appreciate WOC RN care with ostomy  - Retention sutures will need to be maintained for 4-6 weeks - Monitor colostomy output; record - Pain control prn; antiemetics prn - Further management per primary service; we will follow   All of the above findings and recommendations were discussed with the medical team.   -- Lynden Oxford, PA-C [facility] 09/28/2023, 8:27 AM M-F: 7am - 4pm

## 2023-09-28 NOTE — Progress Notes (Signed)
NAME:  Calvin [name], MRN:  [id number], DOB:  01/07/1951, LOS: 25 ADMISSION DATE:  09/03/2023, CONSULTATION DATE:  09/21/2023 REFERRING MD:  Manuela Schwartz, NP, CHIEF COMPLAINT:  Acute Respiratory Distress, Aspiration event   Brief Pt Description / Synopsis:  73 y.o. male admitted with PMHx most significant for colon cancer status post transverse colectomy and colostomy, presented with nausea, vomiting, and shortness of breath.  Was admitted with Sepsis due to Influenza A infection, Aspiration pneumonia, and Small Bowel Obstruction.  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  Course further complicated by Aspiration event requiring intubation and mechanical ventilation on 1/17.  History of Present Illness:  Calvin [name] is a 73 y.o. 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   Was admitted by [facility] for further workup and treatment of Was admitted with Sepsis due to Influenza A infection, Aspiration pneumonia, and Small Bowel Obstruction.  Please see "Significant Hospital Events" section below for full detailed hospital course.  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 side (HCC)    Hypertension    Myocardial infarction (HCC)    Neuromuscular disorder  (HCC)    Ogilvie syndrome    Seizures (HCC)    last 6 months ago   Stroke [facility])    multiple  left side weakness   Tremors of nervous system    Unstable angina (HCC)      Micro Data:  12/30: SARS-CoV-2/RSV/Flu PCR>>+ Influenza A 12/30: Blood cultures x2>>negative  12/30: Urine>>Staphylococcus Aureus 12/30: MRSA PCR + 1/17: Tracheal aspirate>>moderate pseudomonas  1/18: MRSA PCR +  Anti-infectives (From admission, onward)    Start     Dose/Rate Route Frequency Ordered Stop   09/23/23 1100  vancomycin (VANCOCIN) IVPB 1000 mg/200 mL premix  Status:  Discontinued        1,000 mg 200 mL/hr over 60 Minutes Intravenous Every 12 hours 09/23/23 0811 09/24/23 1021   09/22/23 2300  vancomycin (VANCOCIN) IVPB 1000 mg/200 mL premix  Status:  Discontinued        1,000 mg 200 mL/hr over 60 Minutes Intravenous Every 24 hours 09/22/23 0819 09/23/23 0811   09/22/23 1000  vancomycin (VANCOREADY) IVPB 1500 mg/300 mL        1,500 mg 150 mL/hr over 120 Minutes Intravenous  Once 09/22/23 0819 09/22/23 1916   09/21/23 0815  piperacillin-tazobactam (ZOSYN) IVPB 3.375 g        3.375 g 12.5 mL/hr over 240 Minutes Intravenous Every 8 hours 09/21/23 0729 09/27/23 2115   09/11/23 2000  cefoTEtan (CEFOTAN) 2 g in sodium chloride 0.9 % 100 mL IVPB        2 g 200 mL/hr over 30 Minutes Intravenous Every 8 hours 09/11/23 1801 09/12/23 1910   09/11/23 0600  cefoTEtan (CEFOTAN) 2 g in sodium chloride 0.9 % 100 mL IVPB        2 g 200 mL/hr over 30 Minutes Intravenous On call to O.R. 09/10/23 0858 09/12/23 1910   09/04/23 1200  Ampicillin-Sulbactam (UNASYN) 3 g in sodium chloride 0.9 % 100 mL IVPB  Status:  Discontinued        3 g 200 mL/hr over 30 Minutes Intravenous Every 6 hours 09/04/23 1052 09/08/23 1537   09/03/23 1800  ceFEPIme (MAXIPIME) 2 g in sodium chloride 0.9 % 100 mL IVPB  Status:  Discontinued        2 g 200 mL/hr over 30 Minutes Intravenous Every 12 hours 09/03/23 0812 09/04/23 0903   09/03/23  1500  metroNIDAZOLE (FLAGYL) IVPB 500 mg  Status:  Discontinued        500 mg 100 mL/hr over 60 Minutes Intravenous Every 12 hours 09/03/23 0758 09/04/23 0903   09/03/23 1000  oseltamivir (TAMIFLU) capsule 30 mg        30 mg Oral 2 times daily 09/03/23 0757 09/08/23 0959   09/03/23 0315  ceFEPIme (MAXIPIME) 2 g in sodium chloride 0.9 % 100 mL IVPB        2 g 200 mL/hr over 30 Minutes Intravenous  Once 09/03/23 0304 09/03/23 0441   09/03/23 0315  metroNIDAZOLE (FLAGYL) IVPB 500 mg        500 mg 100 mL/hr over 60 Minutes Intravenous  Once 09/03/23 0304 09/03/23 0441   09/03/23 0315  vancomycin (VANCOCIN) IVPB 1000 mg/200 mL premix        1,000 mg 200 mL/hr over 60 Minutes Intravenous  Once 09/03/23 0304 09/03/23 0441      Significant Hospital Events: Including procedures, antibiotic start and stop dates in addition to other pertinent events   12/30: admitted to hospitalist sepsis, AKI, (+)Influenza A, likely aspiration pneumonia, acute hypoxic resp fail, SBO. NG placed, surgical consult.  12/31-01/06: relatively stable, remains SBO w/ NG and on TPN, worsening colostomy prolapse. Anemia w/o overt GIB, holding anti-plt/anti-cogs, GI no plans for endoscopic eval at this time 01/07: exlap, LOA, and colostomy revision  01/08: stable.  01/09: d/c NG 01/10: advancing diet as able. Hgb 6.8 - 1 unit PRBC given  01/11: persistent abd pain and cough, imaging concerning for abdominal surgical wound dehiscence deeper layers, going back to OR. Continue w/ cough suppression, await over-read but no obvious CT chest findings, suspect post-viral bronchitis  01/12: to OR early AM for closure of wound dehiscence. Postop stable, NG in place. CT chest question pneumonia but pt reports coughing improved, suspect was mostly reflux, will continue to monitor cough. Postop care per general surgery include clamp NG and remain NPO for now   01/13: NG out, CLD 01/14: FLD, continue advancing diet tolerated  1/15:  Advanced to dysphagia diet after ST eval.  Discontinue telemetry, palliative care consult, DC central line 1/16: Underwent CT abdomen/pelvis last evening.  Not a candidate for another surgery due to poor functional and nutritional status and multiple recent procedures.  Family deciding on goals of care/hospice 1/17: Aspirated this morning with development of severe respiratory distress and hypoxia requiring emergent intubation.  Now with developing septic shock.  Pt's daughter requesting transfer to [facility] or [facility].  Dr. Aleen Campi spoke both facilities which are at capacity and not accepting transfers. 1/18: Pt remains mechanically intubated overnight required increased dose of fentanyl and vecuronium x1 dose due to vent dyssynchrony.  Pt currently unable to follow commands RASS goal -  4.  Will perform WUA and for possible SBT.  Pt requiring levophed gtt @5  mcg/min and vasopressin 0.03 units/min to maintain map 65 or higher.  Pt extubated to Bipap able to follow commands  1/19: Pt with increased work of breathing due to increased secretions despite scheduled hypertonic saline, Chest PT, and Bipap requiring reintubation  1/20: On minimal vent settings, will keep intubated today as required reintubation yesterday.  Off Vasopressors. KUB with improving ileus, General Surgery ok with starting trickle feeds 1/21: On minimal vent settings.  Daughter at bedside for SBT, FAILED SBT due to tachypnea and increased WOB. 1/22: Pt remains mechanically intubated on PRVC vent settings PEEP 5/FiO2 30% with increased work of breathing; vbg revealed metabolic acidosis requiring 2 amps of bicarb  1/23: Pt remains mechanically intubated on minimal settings.  Tolerating TF's.  Will continue to optimize respiratory status today and plan for SBT on 01/24 with daughter at bedside  1/24:  On minimal vents settings, daughter at bedside for WUA and SBT.  Following commands, EXTUBATED.  Interim History / Subjective:  As outlined above  under significant events   Objective   Blood pressure 105/60, pulse 61, temperature 97.9 F (36.6 C), temperature source Axillary, resp. rate 15, height 5' 10.98" (1.803 m), weight 90.9 kg, SpO2 98%.    Vent Mode: PRVC FiO2 (%):  [28 %-30 %] 28 % Set Rate:  [18 bmp] 18 bmp Vt Set:  [500 mL] 500 mL PEEP:  [5 cmH20] 5 cmH20 Plateau Pressure:  [18 cmH20] 18 cmH20   Intake/Output Summary (Last 24 hours) at 09/28/2023 0750 Last data filed at 09/28/2023 0737 Gross per 24 hour  Intake 1966.49 ml  Output 2425 ml  Net -458.51 ml   Filed Weights   09/23/23 0400 09/24/23 0457 09/28/23 0500  Weight: 93.3 kg 93.3 kg 90.9 kg   Examination: General: Acute on chronically-ill appearing male, mechanically intubated and sedated, in NAD HENT: Atraumatic, normocephalic, neck supple, orally intubated Lungs: Coarse throughout, even, occasionally overbreathing the vent Cardiovascular: NSR, s1s2, no m/r/g, 2+ radial/1+ distal pulses, no edema Abdomen: +BS x4, soft, non distended, colostomy and mucous fistula in left upper quadrant dressing dry/intact, stool in bag dressing dry/intact Extremities: Normal bulk and tone, no deformities Neuro: Sedated, on WUQ is following commands, no focal deficits noted,  bilateral pupils pinpoint and sluggish  GU: External catheter draining yellow urine   Resolved Hospital Problem list     Assessment & Plan:   #Acute metabolic encephalopathy #Mechanical intubation pain/discomfort  Hx: Seizures  - Maintain a RASS goal of 0 to -1 - PAD protocol to maintain RASS goal: propofol gtt and prn dilaudid  - Avoid sedating medications as able - Continue scheduled baclofen  - Daily wake up assessment - Continue lamictal and keppra - Seizure precautions   #Acute hypoxic & hypercapnic respiratory failure #Large volume aspiration #COPD without acute exacerbation #Influenza A infection (present on admission) - Full vent support for now  - Continue lung protective  strategies - Maintain plateau pressures less than 30 cm H20 - Wean FiO2 & PEEP as tolerated to maintain O2 sats >92% - Follow intermittent CXR & ABG as needed - SBT once respiratory parameters met and mental status permits - Implement VAP Bundle - Scheduled and prn bronchodilator therapy  - ABX as outlined above - May require tracheostomy of this aligns with goals of care   #Shock: suspect septic +/- hypovolemic ~ RESOLVED #Chronic HFpEF Echocardiogram 09/22/23: EF 50%; left ventricle demonstrates global hypokinesis; mildly  elevated pulmonary artery systolic pressure; large pleural effusion in the left lateral region; trivial mitral valve regurgitation; trivial aortic valve regurgitation  - Continuous telemetry monitoring - Trend lactic acid until normalized - Troponin peaked at 25 - Diuresis as BP and renal function permits  #AKI on CKD stage II~RESOLVED #BPH - Trend BMP - Replace electrolytes as indicated  - Ensure adequate renal perfusion - Avoid nephrotoxic agents as able - Replace electrolytes as indicated ~ Pharmacy following for assistance with electrolyte replacement  #Aspiration pneumonia #Pseudomonas pneumonia ~ TREATED  #Influenza Infection (POA)~TREATED - Trend WBC and monitor fever curve - Trend PCT   - Follow cultures as above - Completed 7 day course of ABX as above  #Small bowel obstruction vs ileus #Colostomy irrigation/prolapse #Hx: ogilvie syndrome s/p colostomy - NPO; OGT to LIS - General Surgery following, appreciate input: continue TF's   #Anemia - Trend CBC  - Monitor for s/sx of bleeding  - Transfuse for hgb <7   #Prediabetes - CBG's q4hrs - Target range of 140 to 180 - SSI - Follow ICU Hypo/Hyperglycemia protocol   Patient is critically ill with shock and multiorgan failure.  Prognosis is guarded, high risk for further decompensation, cardiac arrest and death.  Given current critical illness superimposed on multiple chronic comorbidities  including colon cancer, along with advanced age, overall long-term prognosis is poor.  Recommend consideration for DNR status.  Palliative care is following for ongoing goals of care discussions.  Best Practice (right click and "Reselect all SmartList Selections" daily)   Diet/type: TF's DVT prophylaxis: Lovenox  GI prophylaxis: PPI Lines: Central line and yes and it is still needed  Foley:  N/A  Code Status:  full code Last date of multidisciplinary goals of care discussion [09/28/23]  09/28/23: Updated pt's daughter and pt's brother at bedside.  Plan for extubation, and if he were to fail trial of extubation, she would want him re intubated and to proceed with Tracheostomy placement.   Labs   CBC: Recent Labs  Lab 09/24/23 0436 09/25/23 0435 09/26/23 0535 09/27/23 0403 09/28/23 0308  WBC 18.0* 15.3* 21.1* 17.9* 14.9*  NEUTROABS  --   --   --  12.3* 8.4*  HGB 7.7* 7.9* 8.1* 7.5* 7.9*  HCT 25.4* 26.8* 27.3* 24.7* 26.7*  MCV 93.7 93.4 95.5 92.9 95.0  PLT 248 267 281 279 345    Basic Metabolic Panel: Recent Labs  Lab 09/24/23 0436 09/24/23 1443 09/25/23 0435 09/25/23 1652 09/26/23 0535 09/27/23 0403 09/28/23 0308  NA 140  --  140  --  141 145 146*  K 3.7  --  3.9  --  3.6 4.4 4.7  CL 108  --  112*  --  111 113* 112*  CO2 23  --  20*  --  21* 24 23  GLUCOSE 133*  --  138*  --  179* 200* 177*  BUN 29*  --  41*  --  37* 37* 38*  CREATININE 0.95  --  0.93  --  0.84 1.00 0.93  CALCIUM 8.1*  --  8.0*  --  8.2* 8.1* 8.2*  MG 2.4   < > 2.1 2.1 2.0 2.1 2.3  PHOS 1.7*  1.7*   < > 3.2 5.3* 2.3* 3.2 3.1   < > = values in this interval not displayed.   GFR: Estimated Creatinine Clearance: 80.3 mL/min (by C-G formula based on SCr of 0.93 mg/dL). Recent Labs  Lab 09/21/23 1007 09/22/23 0526 09/22/23 0958 09/23/23 0411 09/25/23  1610 09/26/23 0535 09/27/23 0403 09/28/23 0308  WBC  --    < >  --    < > 15.3* 21.1* 17.9* 14.9*  LATICACIDVEN 2.0*  --  1.1  --   --   --   --    --    < > = values in this interval not displayed.    Liver Function Tests: Recent Labs  Lab 09/22/23 0526 09/23/23 0411 09/24/23 0436 09/25/23 0435 09/26/23 0535  AST  --   --  12*  --   --   ALT  --   --  16  --   --   ALKPHOS  --   --  74  --   --   BILITOT  --   --  0.6  --   --   PROT  --   --  6.1*  --   --   ALBUMIN 2.1* 2.0* 2.0* 1.8* 1.9*   No results for input(s): "LIPASE", "AMYLASE" in the last 168 hours. No results for input(s): "AMMONIA" in the last 168 hours.  ABG    Component Value Date/Time   PHART 7.33 (L) 09/23/2023 0946   PCO2ART 53 (H) 09/23/2023 0946   PO2ART 73 (L) 09/23/2023 0946   HCO3 25.4 09/27/2023 0813   ACIDBASEDEF 0.8 09/27/2023 0813   O2SAT 79.7 09/27/2023 0813     Coagulation Profile: No results for input(s): "INR", "PROTIME" in the last 168 hours.  Cardiac Enzymes: No results for input(s): "CKTOTAL", "CKMB", "CKMBINDEX", "TROPONINI" in the last 168 hours.  HbA1C: Hgb A1c MFr Bld  Date/Time Value Ref Range Status  05/28/2022 06:17 PM 5.8 (H) 4.8 - 5.6 % Final    Comment:    (NOTE) Pre diabetes:          5.7%-6.4%  Diabetes:              >6.4%  Glycemic control for   <7.0% adults with diabetes   08/09/2019 07:31 AM 5.7 (H) 4.8 - 5.6 % Final    Comment:    (NOTE) Pre diabetes:          5.7%-6.4% Diabetes:              >6.4% Glycemic control for   <7.0% adults with diabetes     CBG: Recent Labs  Lab 09/27/23 1628 09/27/23 1921 09/27/23 2314 09/28/23 0337 09/28/23 0725  GLUCAP 176* 180* 127* 127* 162*    Review of Systems:   Unable to assess due to Intubation/sedation/critical illness  Past Medical History:  He,  has a past medical history of Alcohol abuse, Anemia, Anxiety, Arthritis, Cancer (HCC), COPD (chronic obstructive pulmonary disease) (HCC), Coronary artery disease, Depression, Gout, Hemiplegia and 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 side (HCC), Hypertension, Myocardial  infarction (HCC), Neuromuscular disorder (HCC), Ogilvie syndrome, Seizures (HCC), Stroke (HCC), Tremors of nervous system, and Unstable angina (HCC).   Surgical History:   Past Surgical History:  Procedure Laterality Date   CARPAL TUNNEL RELEASE Left 10/19/2015   Procedure: CARPAL TUNNEL RELEASE;  Surgeon: Kennedy Bucker, MD;  Location: [facility] ORS;  Service: Orthopedics;  Laterality: Left;   COLONOSCOPY WITH PROPOFOL N/A 10/26/2021   Procedure: COLONOSCOPY WITH PROPOFOL;  Surgeon: Regis Bill, MD;  Location: [facility] ENDOSCOPY;  Service: Endoscopy;  Laterality: N/A;   COLONOSCOPY WITH PROPOFOL N/A 06/02/2022   Procedure: COLONOSCOPY WITH PROPOFOL;  Surgeon: Regis Bill, MD;  Location: [facility] ENDOSCOPY;  Service: Endoscopy;  Laterality: N/A;   COLONOSCOPY WITH PROPOFOL  N/A 06/06/2022   Procedure: COLONOSCOPY WITH PROPOFOL;  Surgeon: Regis Bill, MD;  Location: [facility] ENDOSCOPY;  Service: Endoscopy;  Laterality: N/A;   COLOSTOMY REVISION N/A 09/11/2023   Procedure: COLOSTOMY REVISION;  Surgeon: Henrene Dodge, MD;  Location: [facility] ORS;  Service: General;  Laterality: N/A;   JOINT REPLACEMENT     left partial hip    LAPAROSCOPIC SIGMOID COLECTOMY  09/12/2010   Lap hand assisted sigmoidectomy, mobilization splenic flexure -- Dr. Freida Busman   LAPAROTOMY N/A 09/11/2023   Procedure: EXPLORATORY LAPAROTOMY;  Surgeon: Henrene Dodge, MD;  Location: [facility] ORS;  Service: General;  Laterality: N/A;   LAPAROTOMY N/A 09/15/2023   Procedure: EXPLORATORY LAPAROTOMY;  Surgeon: Sung Amabile, DO;  Location: [facility] ORS;  Service: General;  Laterality: N/A;   LYSIS OF ADHESION N/A 09/11/2023   Procedure: LYSIS OF ADHESION;  Surgeon: Henrene Dodge, MD;  Location: [facility] ORS;  Service: General;  Laterality: N/A;   THROAT SURGERY  09/05/2011   cancer     Social History:   reports that he has quit smoking. His smoking use included cigarettes. He has never used smokeless tobacco. He reports that he does not currently  use alcohol. He reports that he does not use drugs.   Family History:  His family history includes Cancer in his mother; Hypertension in his father; Leukemia in his brother.   Allergies No Known Allergies   Home Medications  Prior to Admission medications   Medication Sig Start Date End Date Taking? Authorizing Provider  aspirin 81 MG chewable tablet Take 81 mg by mouth daily.   Yes [provider]  baclofen (LIORESAL) 5 mg TABS tablet Take 5 mg by mouth 2 (two) times daily.   Yes [provider]  diazePAM, 20 MG Dose, (VALTOCO 20 MG DOSE) 2 x 10 MG/0.1ML LQPK Place 20 mg into the nose as needed (seizure lasting over 2 minutes). 08/23/23  Yes Leroy Sea, MD  ipratropium-albuterol (DUONEB) 0.5-2.5 (3) MG/3ML SOLN Inhale 3 mLs into the lungs every 6 (six) hours as needed. 09/02/23  Yes [provider]  lactulose (CHRONULAC) 10 GM/15ML solution Take 20 g by mouth daily. 08/15/23  Yes [provider]  lamoTRIgine (LAMICTAL) 25 MG tablet Take 50 mg by mouth 2 (two) times daily.   Yes [provider]  levETIRAcetam (KEPPRA) 1000 MG tablet Take 1 tablet (1,000 mg total) by mouth 2 (two) times daily. 08/23/23  Yes Leroy Sea, MD  Melatonin 10 MG TABS Take 10 mg by mouth at bedtime.   Yes [provider]  nitroGLYCERIN (NITROSTAT) 0.4 MG SL tablet Place 0.4 mg under the tongue 3 (three) times daily as needed for chest pain. Every 5 minutes up to 3 doses 08/24/23  Yes [provider]  pantoprazole (PROTONIX) 40 MG tablet Take 1 tablet (40 mg total) by mouth daily. 08/23/23  Yes Leroy Sea, MD  polyethylene glycol (MIRALAX / GLYCOLAX) 17 g packet Take 17 g by mouth 2 (two) times daily. 08/19/23  Yes Wieting, Richard, MD  pregabalin (LYRICA) 100 MG capsule Take 200 mg by mouth at bedtime.   Yes [provider]  senna (SENOKOT) 8.6 MG TABS tablet Take 2 tablets (17.2 mg total) by mouth at bedtime as needed for mild  constipation or moderate constipation. 06/26/23  Yes Sunnie Nielsen, DO  tamsulosin (FLOMAX) 0.4 MG CAPS capsule Take 0.4 mg by mouth every evening.   Yes [provider]  divalproex (DEPAKOTE) 125 MG DR tablet Take 4  tablets (500 mg total) by mouth 2 (two) times daily for 1 day, THEN 4 tablets (500 mg total) daily for 3 days, THEN 2 tablets (250 mg total) daily for 3 days, THEN 1 tablet (125 mg total) daily for 3 days. Patient not taking: Reported on 09/03/2023 08/23/23 09/03/23  Leroy Sea, MD  fluticasone [facility]) 50 MCG/ACT nasal spray Place 2 sprays into both nostrils daily. Patient not taking: Reported on 09/03/2023 05/15/23   [provider]  Mouthwashes (MOUTH RINSE) LIQD solution 15 mLs by Mouth Rinse route every 2 (two) hours. Patient not taking: Reported on 09/03/2023 08/19/23   Alford Highland, MD  pregabalin (LYRICA) 50 MG capsule Take 50 mg capsule twice daily and 100 mg (two capsules) at bedtime Patient not taking: Reported on 09/03/2023 08/23/23   Leroy Sea, MD  tiotropium (SPIRIVA) 18 MCG inhalation capsule Place 18 mcg into inhaler and inhale daily. Patient not taking: Reported on 09/03/2023    [provider]     Critical care time: 40 minutes    Harlon Ditty, AGACNP-BC [facility] Prefer epic messenger for cross cover needs If after hours, please call E-link

## 2023-09-28 NOTE — Progress Notes (Signed)
Extubation order written. Cuff leak noted. Patient extubated and placed on 4L [place] Will continue to monitor.

## 2023-09-28 NOTE — Plan of Care (Signed)
Patient extubated this morning. Patient remains on 2L Nasal Cannula. Patient has a weak cough. Requires frequent oral suctioning.   Problem: Clinical Measurements: Goal: Ability to maintain clinical measurements within normal limits will improve Outcome: Progressing Goal: Respiratory complications will improve Outcome: Progressing

## 2023-09-28 NOTE — Plan of Care (Signed)
  Problem: Clinical Measurements: Goal: Diagnostic test results will improve Outcome: Progressing Goal: Respiratory complications will improve Outcome: Progressing Goal: Cardiovascular complication will be avoided Outcome: Progressing   Problem: Nutrition: Goal: Adequate nutrition will be maintained Outcome: Progressing   Problem: Pain Management: Goal: General experience of comfort will improve Outcome: Progressing   Problem: Respiratory: Goal: Ability to maintain adequate ventilation will improve Outcome: Progressing

## 2023-09-28 NOTE — TOC Progression Note (Signed)
Transition of Care [facility]) - Progression Note    Patient Details  Name: Calvin [name] MRN: [id number] Date of Birth: Jul 12, 1951  Transition of [facility]) CM/SW Contact  Margarito Liner, LCSW Phone Number: 09/28/2023, 4:13 PM  Clinical Narrative:  TOC continues to follow progress.   Expected Discharge Plan: Long Term Nursing Home Barriers to Discharge: Continued Medical Work up  Expected Discharge Plan and Services                                               Social Determinants of Health (SDOH) Interventions SDOH Screenings   Food Insecurity: No Food Insecurity (09/11/2023)  Housing: Low Risk  (09/11/2023)  Transportation Needs: No Transportation Needs (09/11/2023)  Utilities: Not At Risk (09/11/2023)  Social Connections: Unknown (09/11/2023)  Tobacco Use: Medium Risk (09/03/2023)    Readmission Risk Interventions    08/20/2023   12:32 PM 07/17/2023   12:24 PM 06/30/2022    2:25 PM  Readmission Risk Prevention Plan  Transportation Screening Complete Complete Complete  PCP or Specialist Appt within 3-5 Days  Complete   Social Work Consult for Recovery Care Planning/Counseling  Complete   Palliative Care Screening  Not Applicable   Medication Review Ocean[employer]) Complete Complete   PCP or Specialist appointment within 3-5 days of discharge Complete    HRI or Home Care Consult Complete  Complete  SW Recovery Care/Counseling Consult Complete  Complete  Palliative Care Screening Not Applicable  Complete  Skilled Nursing Facility Complete  Complete

## 2023-09-29 ENCOUNTER — Inpatient Hospital Stay: Payer: Medicare Other

## 2023-09-29 DIAGNOSIS — T81321A Disruption or dehiscence of closure of internal operation (surgical) wound of abdominal wall muscle or fascia, initial encounter: Secondary | ICD-10-CM | POA: Diagnosis not present

## 2023-09-29 DIAGNOSIS — J9601 Acute respiratory failure with hypoxia: Secondary | ICD-10-CM | POA: Diagnosis not present

## 2023-09-29 LAB — BLOOD GAS, ARTERIAL
Acid-Base Excess: 3.9 mmol/L — ABNORMAL HIGH (ref 0.0–2.0)
Bicarbonate: 27.7 mmol/L (ref 20.0–28.0)
FIO2: 30 %
MECHVT: 500 mL
Mechanical Rate: 18
O2 Saturation: 92.6 %
PEEP: 5 cmH2O
Patient temperature: 37
pCO2 arterial: 38 mm[Hg] (ref 32–48)
pH, Arterial: 7.47 — ABNORMAL HIGH (ref 7.35–7.45)
pO2, Arterial: 62 mm[Hg] — ABNORMAL LOW (ref 83–108)

## 2023-09-29 LAB — GLUCOSE, CAPILLARY
Glucose-Capillary: 105 mg/dL — ABNORMAL HIGH (ref 70–99)
Glucose-Capillary: 75 mg/dL (ref 70–99)
Glucose-Capillary: 79 mg/dL (ref 70–99)
Glucose-Capillary: 82 mg/dL (ref 70–99)
Glucose-Capillary: 98 mg/dL (ref 70–99)
Glucose-Capillary: 99 mg/dL (ref 70–99)

## 2023-09-29 LAB — CBC
HCT: 27.4 % — ABNORMAL LOW (ref 39.0–52.0)
Hemoglobin: 8.4 g/dL — ABNORMAL LOW (ref 13.0–17.0)
MCH: 28.8 pg (ref 26.0–34.0)
MCHC: 30.7 g/dL (ref 30.0–36.0)
MCV: 93.8 fL (ref 80.0–100.0)
Platelets: 365 10*3/uL (ref 150–400)
RBC: 2.92 MIL/uL — ABNORMAL LOW (ref 4.22–5.81)
RDW: 15.9 % — ABNORMAL HIGH (ref 11.5–15.5)
WBC: 12.8 10*3/uL — ABNORMAL HIGH (ref 4.0–10.5)
nRBC: 0.9 % — ABNORMAL HIGH (ref 0.0–0.2)

## 2023-09-29 LAB — RENAL FUNCTION PANEL
Albumin: 2.4 g/dL — ABNORMAL LOW (ref 3.5–5.0)
Anion gap: 13 (ref 5–15)
BUN: 31 mg/dL — ABNORMAL HIGH (ref 8–23)
CO2: 24 mmol/L (ref 22–32)
Calcium: 8.4 mg/dL — ABNORMAL LOW (ref 8.9–10.3)
Chloride: 108 mmol/L (ref 98–111)
Creatinine, Ser: 0.89 mg/dL (ref 0.61–1.24)
GFR, Estimated: >60 mL/min (ref >60)
Glucose, Bld: 100 mg/dL — ABNORMAL HIGH (ref 70–99)
Phosphorus: 4.7 mg/dL — ABNORMAL HIGH (ref 2.5–4.6)
Potassium: 4.7 mmol/L (ref 3.5–5.1)
Sodium: 145 mmol/L (ref 135–145)

## 2023-09-29 MED ORDER — FENTANYL CITRATE PF 50 MCG/ML IJ SOSY
50.0000 ug | PREFILLED_SYRINGE | Freq: Once | INTRAMUSCULAR | Status: AC
  Administered 2023-09-29: 50 ug via INTRAVENOUS
  Filled 2023-09-29: qty 1

## 2023-09-29 MED ORDER — FENTANYL CITRATE (PF) 100 MCG/2ML IJ SOLN
100.0000 ug | Freq: Once | INTRAMUSCULAR | Status: AC
  Administered 2023-09-29: 100 ug via INTRAVENOUS
  Filled 2023-09-29: qty 2

## 2023-09-29 MED ORDER — CHLORHEXIDINE GLUCONATE 0.12 % MT SOLN
15.0000 mL | Freq: Once | OROMUCOSAL | Status: AC
Start: 2023-09-29 — End: 2023-09-29
  Administered 2023-09-29: 15 mL via OROMUCOSAL
  Filled 2023-09-29: qty 15

## 2023-09-29 MED ORDER — ORAL CARE MOUTH RINSE
15.0000 mL | OROMUCOSAL | Status: DC
  Administered 2023-09-29 – 2023-09-30 (×7): 15 mL via OROMUCOSAL

## 2023-09-29 MED ORDER — MIDAZOLAM HCL 2 MG/2ML IJ SOLN
4.0000 mg | Freq: Once | INTRAMUSCULAR | Status: AC
  Administered 2023-09-29: 4 mg via INTRAVENOUS
  Filled 2023-09-29: qty 4

## 2023-09-29 MED ORDER — HYDROMORPHONE BOLUS VIA INFUSION
0.2500 mg | INTRAVENOUS | Status: DC | PRN
Start: 2023-09-29 — End: 2023-10-06
  Administered 2023-09-29 – 2023-10-06 (×6): 1 mg via INTRAVENOUS

## 2023-09-29 MED ORDER — FENTANYL CITRATE PF 50 MCG/ML IJ SOSY
25.0000 ug | PREFILLED_SYRINGE | INTRAMUSCULAR | Status: DC | PRN
  Administered 2023-09-29 (×4): 50 ug via INTRAVENOUS
  Filled 2023-09-29 (×4): qty 1

## 2023-09-29 MED ORDER — HYDROMORPHONE HCL 1 MG/ML IJ SOLN
0.5000 mg | Freq: Once | INTRAMUSCULAR | Status: AC
  Administered 2023-09-29: 0.5 mg via INTRAVENOUS
  Filled 2023-09-29: qty 1

## 2023-09-29 MED ORDER — LORAZEPAM 2 MG/ML IJ SOLN
1.0000 mg | Freq: Once | INTRAMUSCULAR | Status: AC
  Administered 2023-09-29: 1 mg via INTRAVENOUS
  Filled 2023-09-29: qty 1

## 2023-09-29 MED ORDER — POLYETHYLENE GLYCOL 3350 17 G PO PACK
17.0000 g | PACK | Freq: Every day | ORAL | Status: DC
  Administered 2023-10-01 – 2023-10-03 (×2): 17 g
  Filled 2023-09-29 (×2): qty 1

## 2023-09-29 MED ORDER — DOCUSATE SODIUM 50 MG/5ML PO LIQD
100.0000 mg | Freq: Two times a day (BID) | ORAL | Status: DC
  Administered 2023-09-29 – 2023-10-05 (×9): 100 mg
  Filled 2023-09-29 (×7): qty 10

## 2023-09-29 MED ORDER — GLYCOPYRROLATE 0.2 MG/ML IJ SOLN
0.2000 mg | INTRAMUSCULAR | Status: DC | PRN
  Administered 2023-09-29: 0.2 mg via INTRAVENOUS
  Filled 2023-09-29: qty 1

## 2023-09-29 MED ORDER — HYDROMORPHONE HCL 1 MG/ML IJ SOLN
1.0000 mg | INTRAMUSCULAR | Status: DC | PRN
  Administered 2023-09-29: 1 mg via INTRAVENOUS
  Filled 2023-09-29: qty 1

## 2023-09-29 MED ORDER — MIDAZOLAM-SODIUM CHLORIDE 100-0.9 MG/100ML-% IV SOLN
0.5000 mg/h | INTRAVENOUS | Status: DC
Start: 2023-09-29 — End: 2023-10-06
  Administered 2023-09-29: 0.5 mg/h via INTRAVENOUS
  Administered 2023-10-02: 1 mg/h via INTRAVENOUS
  Filled 2023-09-29 (×2): qty 100

## 2023-09-29 MED ORDER — HYDROMORPHONE HCL-NACL 50-0.9 MG/50ML-% IV SOLN
0.5000 mg/h | INTRAVENOUS | Status: DC
  Administered 2023-09-29: 4 mg/h via INTRAVENOUS
  Administered 2023-09-29: 0.5 mg/h via INTRAVENOUS
  Filled 2023-09-29 (×3): qty 50

## 2023-09-29 MED ORDER — PANTOPRAZOLE SODIUM 40 MG IV SOLR: 40.0000 mg | INTRAVENOUS | Status: DC

## 2023-09-29 MED ORDER — VECURONIUM BROMIDE 10 MG IV SOLR
10.0000 mg | Freq: Once | INTRAVENOUS | Status: AC
  Administered 2023-09-29: 10 mg via INTRAVENOUS
  Filled 2023-09-29: qty 10

## 2023-09-29 MED ORDER — ORAL CARE MOUTH RINSE: 15.0000 mL | OROMUCOSAL | Status: DC | PRN

## 2023-09-29 NOTE — Progress Notes (Addendum)
NAME:  Calvin [name], MRN:  [id number], DOB:  1950-09-30, LOS: 26 ADMISSION DATE:  09/03/2023, CONSULTATION DATE:  09/21/2023 REFERRING MD:  Manuela Schwartz, NP, CHIEF COMPLAINT:  Acute Respiratory Distress, Aspiration event   Brief Pt Description / Synopsis:  73 y.o. male admitted with PMHx most significant for colon cancer status post transverse colectomy and colostomy, presented with nausea, vomiting, and shortness of breath.  Was admitted with Sepsis due to Influenza A infection, Aspiration pneumonia, and Small Bowel Obstruction.  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  Course further complicated by Aspiration event requiring intubation and mechanical ventilation on 1/17.  History of Present Illness:  Calvin [name] is a 73 y.o. 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   Was admitted by [facility] for further workup and treatment of Was admitted with Sepsis due to Influenza A infection, Aspiration pneumonia, and Small Bowel Obstruction.  Please see "Significant Hospital Events" section below for full detailed hospital course.  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 side (HCC)    Hypertension    Myocardial infarction (HCC)    Neuromuscular disorder  (HCC)    Ogilvie syndrome    Seizures (HCC)    last 6 months ago   Stroke [facility])    multiple  left side weakness   Tremors of nervous system    Unstable angina (HCC)      Micro Data:  12/30: SARS-CoV-2/RSV/Flu PCR>>+ Influenza A 12/30: Blood cultures x2>>negative  12/30: Urine>>Staphylococcus Aureus 12/30: MRSA PCR + 1/17: Tracheal aspirate>>moderate pseudomonas  1/18: MRSA PCR +  Anti-infectives (From admission, onward)    Start     Dose/Rate Route Frequency Ordered Stop   09/23/23 1100  vancomycin (VANCOCIN) IVPB 1000 mg/200 mL premix  Status:  Discontinued        1,000 mg 200 mL/hr over 60 Minutes Intravenous Every 12 hours 09/23/23 0811 09/24/23 1021   09/22/23 2300  vancomycin (VANCOCIN) IVPB 1000 mg/200 mL premix  Status:  Discontinued        1,000 mg 200 mL/hr over 60 Minutes Intravenous Every 24 hours 09/22/23 0819 09/23/23 0811   09/22/23 1000  vancomycin (VANCOREADY) IVPB 1500 mg/300 mL        1,500 mg 150 mL/hr over 120 Minutes Intravenous  Once 09/22/23 0819 09/22/23 1916   09/21/23 0815  piperacillin-tazobactam (ZOSYN) IVPB 3.375 g        3.375 g 12.5 mL/hr over 240 Minutes Intravenous Every 8 hours 09/21/23 0729 09/27/23 2115   09/11/23 2000  cefoTEtan (CEFOTAN) 2 g in sodium chloride 0.9 % 100 mL IVPB        2 g 200 mL/hr over 30 Minutes Intravenous Every 8 hours 09/11/23 1801 09/12/23 1910   09/11/23 0600  cefoTEtan (CEFOTAN) 2 g in sodium chloride 0.9 % 100 mL IVPB        2 g 200 mL/hr over 30 Minutes Intravenous On call to O.R. 09/10/23 0858 09/12/23 1910   09/04/23 1200  Ampicillin-Sulbactam (UNASYN) 3 g in sodium chloride 0.9 % 100 mL IVPB  Status:  Discontinued        3 g 200 mL/hr over 30 Minutes Intravenous Every 6 hours 09/04/23 1052 09/08/23 1537   09/03/23 1800  ceFEPIme (MAXIPIME) 2 g in sodium chloride 0.9 % 100 mL IVPB  Status:  Discontinued        2 g 200 mL/hr over 30 Minutes Intravenous Every 12 hours 09/03/23 0812 09/04/23 0903   09/03/23  1500  metroNIDAZOLE (FLAGYL) IVPB 500 mg  Status:  Discontinued        500 mg 100 mL/hr over 60 Minutes Intravenous Every 12 hours 09/03/23 0758 09/04/23 0903   09/03/23 1000  oseltamivir (TAMIFLU) capsule 30 mg        30 mg Oral 2 times daily 09/03/23 0757 09/08/23 0959   09/03/23 0315  ceFEPIme (MAXIPIME) 2 g in sodium chloride 0.9 % 100 mL IVPB        2 g 200 mL/hr over 30 Minutes Intravenous  Once 09/03/23 0304 09/03/23 0441   09/03/23 0315  metroNIDAZOLE (FLAGYL) IVPB 500 mg        500 mg 100 mL/hr over 60 Minutes Intravenous  Once 09/03/23 0304 09/03/23 0441   09/03/23 0315  vancomycin (VANCOCIN) IVPB 1000 mg/200 mL premix        1,000 mg 200 mL/hr over 60 Minutes Intravenous  Once 09/03/23 0304 09/03/23 0441      Significant Hospital Events: Including procedures, antibiotic start and stop dates in addition to other pertinent events   12/30: admitted to hospitalist sepsis, AKI, (+)Influenza A, likely aspiration pneumonia, acute hypoxic resp fail, SBO. NG placed, surgical consult.  12/31-01/06: relatively stable, remains SBO w/ NG and on TPN, worsening colostomy prolapse. Anemia w/o overt GIB, holding anti-plt/anti-cogs, GI no plans for endoscopic eval at this time 01/07: exlap, LOA, and colostomy revision  01/08: stable.  01/09: d/c NG 01/10: advancing diet as able. Hgb 6.8 - 1 unit PRBC given  01/11: persistent abd pain and cough, imaging concerning for abdominal surgical wound dehiscence deeper layers, going back to OR. Continue w/ cough suppression, await over-read but no obvious CT chest findings, suspect post-viral bronchitis  01/12: to OR early AM for closure of wound dehiscence. Postop stable, NG in place. CT chest question pneumonia but pt reports coughing improved, suspect was mostly reflux, will continue to monitor cough. Postop care per general surgery include clamp NG and remain NPO for now   01/13: NG out, CLD 01/14: FLD, continue advancing diet tolerated  1/15:  Advanced to dysphagia diet after ST eval.  Discontinue telemetry, palliative care consult, DC central line 1/16: Underwent CT abdomen/pelvis last evening.  Not a candidate for another surgery due to poor functional and nutritional status and multiple recent procedures.  Family deciding on goals of care/hospice 1/17: Aspirated this morning with development of severe respiratory distress and hypoxia requiring emergent intubation.  Now with developing septic shock.  Pt's daughter requesting transfer to [facility] or [facility].  Dr. Aleen Campi spoke both facilities which are at capacity and not accepting transfers. 1/18: Pt remains mechanically intubated overnight required increased dose of fentanyl and vecuronium x1 dose due to vent dyssynchrony.  Pt currently unable to follow commands RASS goal -  4.  Will perform WUA and for possible SBT.  Pt requiring levophed gtt @5  mcg/min and vasopressin 0.03 units/min to maintain map 65 or higher.  Pt extubated to Bipap able to follow commands  1/19: Pt with increased work of breathing due to increased secretions despite scheduled hypertonic saline, Chest PT, and Bipap requiring reintubation  1/20: On minimal vent settings, will keep intubated today as required reintubation yesterday.  Off Vasopressors. KUB with improving ileus, General Surgery ok with starting trickle feeds 1/21: On minimal vent settings.  Daughter at bedside for SBT, FAILED SBT due to tachypnea and increased WOB. 1/22: Pt remains mechanically intubated on PRVC vent settings PEEP 5/FiO2 30% with increased work of breathing; vbg revealed metabolic acidosis requiring 2 amps of bicarb  1/23: Pt remains mechanically intubated on minimal settings.  Tolerating TF's.  Will continue to optimize respiratory status today and plan for SBT on 01/24 with daughter at bedside  1/24:  On minimal vents settings, daughter at bedside for WUA and SBT.  Following commands, EXTUBATED. 1/25: Overnight noted with slight wound dehiscence due  to excessive coughing and straining postextubation.  Discussed with general surgery will follow in a.m.  Interim History / Subjective:  As outlined above under significant events   Objective   Blood pressure 135/77, pulse 85, temperature 97.9 F (36.6 C), temperature source Oral, resp. rate (!) 26, height 5' 10.98" (1.803 m), weight 93 kg, SpO2 99%.    Vent Mode: PRVC FiO2 (%):  [28 %] 28 % Set Rate:  [18 bmp] 18 bmp Vt Set:  [500 mL] 500 mL PEEP:  [5 cmH20] 5 cmH20 Plateau Pressure:  [12 cmH20] 12 cmH20   Intake/Output Summary (Last 24 hours) at 09/29/2023 0457 Last data filed at 09/29/2023 0347 Gross per 24 hour  Intake 2112.12 ml  Output 2250 ml  Net -137.88 ml   Filed Weights   09/24/23 0457 09/28/23 0500 09/29/23 0410  Weight: 93.3 kg 90.9 kg 93 kg   Examination: General: Acute on chronically-ill appearing male, mechanically intubated and sedated, in NAD HENT: Atraumatic, normocephalic, neck supple, orally intubated Lungs: Coarse throughout, even, occasionally overbreathing the vent Cardiovascular: NSR, s1s2, no m/r/g, 2+ radial/1+ distal pulses, no edema Abdomen: +BS x4, soft, non distended,  midline abdominal sutures and staples. Separation and mid section with what appears to be bowel leakage around the separation area. colostomy and mucous fistula in left upper quadrant dressing dry/intact, stool in bag dressing dry/intact  Extremities: Normal bulk and tone, no deformities Neuro: Sedated, on WUQ is following commands, no focal deficits noted,  bilateral pupils pinpoint and sluggish  GU: External catheter draining yellow urine   Resolved Hospital Problem list     Assessment & Plan:   #Acute metabolic encephalopathy Hx: Seizures  -Continue scheduled baclofen  -Continue lamictal and keppra -Seizure precautions   #Acute hypoxic & hypercapnic respiratory failure #Large volume aspiration #COPD without acute exacerbation #Influenza A infection (present on  admission) S/p extubation 1/24 -Supplemental O2 as needed to maintain O2 saturations 88 to 92% -High risk for re-intubation -Follow intermittent Chest X-ray & ABG as needed -Bronchodilators scheduled and PRN, Solu-Medrol 20 mg twice daily -Continue hypertonic saline nebulizer  #Shock: suspect septic +/- hypovolemic ~ RESOLVED #Chronic HFpEF Echocardiogram 09/22/23: EF 50%; left ventricle demonstrates global hypokinesis; mildly elevated pulmonary artery systolic pressure; large pleural effusion in the left lateral region; trivial mitral valve regurgitation; trivial aortic valve regurgitation  -Continuous telemetry monitoring -Trend lactic acid until normalized -Troponin peaked at 25 -Diuresis  as BP and renal function permits  #AKI on CKD stage II~RESOLVED #BPH -Trend BMP -Replace electrolytes as indicated  -Ensure adequate renal perfusion -Avoid nephrotoxic agents as able -Replace electrolytes as indicated ~ Pharmacy following for assistance with electrolyte replacement  #Aspiration pneumonia #Pseudomonas pneumonia ~ TREATED  #Influenza Infection (POA)~TREATED -Trend WBC and monitor fever curve -Trend PCT   -Follow cultures as above -Completed 7 day course of ABX as above  #Small bowel obstruction vs ileus #Colostomy irrigation/prolapse #Hx: ogilvie syndrome s/p colostomy Postop closure of wound dehiscence with retention sutures s/p ex lap, LOA and colostomy revision -NPO; OGT to LIS -General Surgery following, appreciate input: continue TF's  -Overnight with mid wound dehiscence likely in the setting of coughing and straining postextubation.  Wound appears to be leaking fecal matter, dressed with Xeroform and wet to dry dressing.  Discussed with general surgery who will continue to follow in the morning and eval if likely will need to go back to OR  #Anemia -Trend CBC  -Monitor for s/sx of bleeding  -Transfuse for hgb <7   #Prediabetes -CBG's q4hrs -Target range of 140  to 180 -SSI -Follow ICU Hypo/Hyperglycemia protocol   Patient is critically ill with shock and multiorgan failure.  Prognosis is guarded, high risk for further decompensation, cardiac arrest and death.  Given current critical illness superimposed on multiple chronic comorbidities including colon cancer, along with advanced age, overall long-term prognosis is poor.  Recommend consideration for DNR status.  Palliative care is following for ongoing goals of care discussions.  Best Practice (right click and "Reselect all SmartList Selections" daily)   Diet/type: TF's DVT prophylaxis: Lovenox  GI prophylaxis: PPI Lines: Central line and yes and it is still needed  Foley:  N/A  Code Status:  full code Last date of multidisciplinary goals of care discussion [09/28/23]  09/28/23: s/p eextubation, per daughter she would want him re intubated and to proceed with Tracheostomy placement.   Labs   CBC: Recent Labs  Lab 09/25/23 0435 09/26/23 0535 09/27/23 0403 09/28/23 0308 09/29/23 0401  WBC 15.3* 21.1* 17.9* 14.9* 12.8*  NEUTROABS  --   --  12.3* 8.4*  --   HGB 7.9* 8.1* 7.5* 7.9* 8.4*  HCT 26.8* 27.3* 24.7* 26.7* 27.4*  MCV 93.4 95.5 92.9 95.0 93.8  PLT 267 281 279 345 365    Basic Metabolic Panel: Recent Labs  Lab 09/25/23 0435 09/25/23 1652 09/26/23 0535 09/27/23 0403 09/28/23 0308 09/29/23 0401  NA 140  --  141 145 146* 145  K 3.9  --  3.6 4.4 4.7 4.7  CL 112*  --  111 113* 112* 108  CO2 20*  --  21* 24 23 24   GLUCOSE 138*  --  179* 200* 177* 100*  BUN 41*  --  37* 37* 38* 31*  CREATININE 0.93  --  0.84 1.00 0.93 0.89  CALCIUM 8.0*  --  8.2* 8.1* 8.2* 8.4*  MG 2.1 2.1 2.0 2.1 2.3  --   PHOS 3.2 5.3* 2.3* 3.2 3.1 4.7*   GFR: Estimated Creatinine Clearance: 84.9 mL/min (by C-G formula based on SCr of 0.89 mg/dL). Recent Labs  Lab 09/22/23 0958 09/23/23 0411 09/26/23 0535 09/27/23 0403 09/28/23 0308 09/29/23 0401  WBC  --    < > 21.1* 17.9* 14.9* 12.8*   LATICACIDVEN 1.1  --   --   --   --   --    < > = values in this interval not displayed.    Liver  Function Tests: Recent Labs  Lab 09/23/23 0411 09/24/23 0436 09/25/23 0435 09/26/23 0535 09/29/23 0401  AST  --  12*  --   --   --   ALT  --  16  --   --   --   ALKPHOS  --  74  --   --   --   BILITOT  --  0.6  --   --   --   PROT  --  6.1*  --   --   --   ALBUMIN 2.0* 2.0* 1.8* 1.9* 2.4*   No results for input(s): "LIPASE", "AMYLASE" in the last 168 hours. No results for input(s): "AMMONIA" in the last 168 hours.  ABG    Component Value Date/Time   PHART 7.33 (L) 09/23/2023 0946   PCO2ART 53 (H) 09/23/2023 0946   PO2ART 73 (L) 09/23/2023 0946   HCO3 25.4 09/27/2023 0813   ACIDBASEDEF 0.8 09/27/2023 0813   O2SAT 79.7 09/27/2023 0813     Coagulation Profile: No results for input(s): "INR", "PROTIME" in the last 168 hours.  Cardiac Enzymes: No results for input(s): "CKTOTAL", "CKMB", "CKMBINDEX", "TROPONINI" in the last 168 hours.  HbA1C: Hgb A1c MFr Bld  Date/Time Value Ref Range Status  05/28/2022 06:17 PM 5.8 (H) 4.8 - 5.6 % Final    Comment:    (NOTE) Pre diabetes:          5.7%-6.4%  Diabetes:              >6.4%  Glycemic control for   <7.0% adults with diabetes   08/09/2019 07:31 AM 5.7 (H) 4.8 - 5.6 % Final    Comment:    (NOTE) Pre diabetes:          5.7%-6.4% Diabetes:              >6.4% Glycemic control for   <7.0% adults with diabetes     CBG: Recent Labs  Lab 09/28/23 1135 09/28/23 1552 09/28/23 1947 09/28/23 2343 09/29/23 0326  GLUCAP 98 91 94 75 82    Review of Systems:   Unable to assess due to Intubation/sedation/critical illness  Past Medical History:  He,  has a past medical history of Alcohol abuse, Anemia, Anxiety, Arthritis, Cancer (HCC), COPD (chronic obstructive pulmonary disease) (HCC), Coronary artery disease, Depression, Gout, Hemiplegia and 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 side  (HCC), Hypertension, Myocardial infarction (HCC), Neuromuscular disorder (HCC), Ogilvie syndrome, Seizures (HCC), Stroke (HCC), Tremors of nervous system, and Unstable angina (HCC).   Surgical History:   Past Surgical History:  Procedure Laterality Date   CARPAL TUNNEL RELEASE Left 10/19/2015   Procedure: CARPAL TUNNEL RELEASE;  Surgeon: Kennedy Bucker, MD;  Location: [facility] OR[facility];  Service: Orthopedics;  Laterality: Left;   COLONOSCOPY WITH PROPOFOL N/A 10/26/2021   Procedure: COLONOSCOPY WITH PROPOFOL;  Surgeon: Regis Bill, MD;  Location: [facility];  Service: Endoscopy;  Laterality: N/A;   COLONOSCOPY WITH PROPOFOL N/A 06/02/2022   Procedure: COLONOSCOPY WITH PROPOFOL;  Surgeon: Regis Bill, MD;  Location: [facility];  Service: Endoscopy;  Laterality: N/A;   COLONOSCOPY WITH PROPOFOL N/A 06/06/2022   Procedure: COLONOSCOPY WITH PROPOFOL;  Surgeon: Regis Bill, MD;  Location: [facility];  Service: Endoscopy;  Laterality: N/A;   COLOSTOMY REVISION N/A 09/11/2023   Procedure: COLOSTOMY REVISION;  Surgeon: Henrene Dodge, MD;  Location: [facility];  Service: General;  Laterality: N/A;   JOINT REPLACEMENT     left partial hip    LAPAROSCOPIC  SIGMOID COLECTOMY  09/12/2010   Lap hand assisted sigmoidectomy, mobilization splenic flexure -- Dr. Freida Busman   LAPAROTOMY N/A 09/11/2023   Procedure: EXPLORATORY LAPAROTOMY;  Surgeon: Henrene Dodge, MD;  Location: [facility];  Service: General;  Laterality: N/A;   LAPAROTOMY N/A 09/15/2023   Procedure: EXPLORATORY LAPAROTOMY;  Surgeon: Sung Amabile, DO;  Location: [facility];  Service: General;  Laterality: N/A;   LYSIS OF ADHESION N/A 09/11/2023   Procedure: LYSIS OF ADHESION;  Surgeon: Henrene Dodge, MD;  Location: [facility];  Service: General;  Laterality: N/A;   THROAT SURGERY  09/05/2011   cancer     Social History:   reports that he has quit smoking. His smoking use included cigarettes. He has never used smokeless tobacco. He  reports that he does not currently use alcohol. He reports that he does not use drugs.   Family History:  His family history includes Cancer in his mother; Hypertension in his father; Leukemia in his brother.   Allergies No Known Allergies   Home Medications  Prior to Admission medications   Medication Sig Start Date End Date Taking? Authorizing Provider  aspirin 81 MG chewable tablet Take 81 mg by mouth daily.   Yes [provider]  baclofen (LIORESAL) 5 mg TABS tablet Take 5 mg by mouth 2 (two) times daily.   Yes [provider]  diazePAM, 20 MG Dose, (VALTOCO 20 MG DOSE) 2 x 10 MG/0.1ML LQPK Place 20 mg into the nose as needed (seizure lasting over 2 minutes). 08/23/23  Yes Leroy Sea, MD  ipratropium-albuterol (DUONEB) 0.5-2.5 (3) MG/3ML SOLN Inhale 3 mLs into the lungs every 6 (six) hours as needed. 09/02/23  Yes [provider]  lactulose (CHRONULAC) 10 GM/15ML solution Take 20 g by mouth daily. 08/15/23  Yes [provider]  lamoTRIgine (LAMICTAL) 25 MG tablet Take 50 mg by mouth 2 (two) times daily.   Yes [provider]  levETIRAcetam (KEPPRA) 1000 MG tablet Take 1 tablet (1,000 mg total) by mouth 2 (two) times daily. 08/23/23  Yes Leroy Sea, MD  Melatonin 10 MG TABS Take 10 mg by mouth at bedtime.   Yes [provider]  nitroGLYCERIN (NITROSTAT) 0.4 MG SL tablet Place 0.4 mg under the tongue 3 (three) times daily as needed for chest pain. Every 5 minutes up to 3 doses 08/24/23  Yes [provider]  pantoprazole (PROTONIX) 40 MG tablet Take 1 tablet (40 mg total) by mouth daily. 08/23/23  Yes Leroy Sea, MD  polyethylene glycol (MIRALAX / GLYCOLAX) 17 g packet Take 17 g by mouth 2 (two) times daily. 08/19/23  Yes Wieting, Richard, MD  pregabalin (LYRICA) 100 MG capsule Take 200 mg by mouth at bedtime.   Yes [provider]  senna (SENOKOT) 8.6 MG TABS tablet Take 2 tablets (17.2 mg total) by  mouth at bedtime as needed for mild constipation or moderate constipation. 06/26/23  Yes Sunnie Nielsen, DO  tamsulosin (FLOMAX) 0.4 MG CAPS capsule Take 0.4 mg by mouth every evening.   Yes [provider]  divalproex (DEPAKOTE) 125 MG DR tablet Take 4 tablets (500 mg total) by mouth 2 (two) times daily for 1 day, THEN 4 tablets (500 mg total) daily for 3 days, THEN 2 tablets (250 mg total) daily for 3 days, THEN 1 tablet (125 mg total) daily for 3 days. Patient not taking: Reported on 09/03/2023 08/23/23 09/03/23  Leroy Sea, MD  fluticasone [facility]) 50 MCG/ACT nasal spray Place 2  sprays into both nostrils daily. Patient not taking: Reported on 09/03/2023 05/15/23   [provider]  Mouthwashes (MOUTH RINSE) LIQD solution 15 mLs by Mouth Rinse route every 2 (two) hours. Patient not taking: Reported on 09/03/2023 08/19/23   Alford Highland, MD  pregabalin (LYRICA) 50 MG capsule Take 50 mg capsule twice daily and 100 mg (two capsules) at bedtime Patient not taking: Reported on 09/03/2023 08/23/23   Leroy Sea, MD  tiotropium (SPIRIVA) 18 MCG inhalation capsule Place 18 mcg into inhaler and inhale daily. Patient not taking: Reported on 09/03/2023    [provider]  Scheduled Meds:  Chlorhexidine Gluconate Cloth  6 each Topical Daily   enoxaparin (LOVENOX) injection  40 mg Subcutaneous QHS   feeding supplement (PROSource TF20)  60 mL Per Tube BID   insulin aspart  0-15 Units Subcutaneous Q4H   ipratropium-albuterol  3 mL Inhalation TID   lamoTRIgine  50 mg Per Tube BID   methylPREDNISolone (SOLU-MEDROL) injection  20 mg Intravenous Q12H   mupirocin ointment  1 Application Nasal BID   mouth rinse  15 mL Mouth Rinse 4 times per day   oxyCODONE  5 mg Per Tube Q6H   pantoprazole (PROTONIX) IV  40 mg Intravenous Q12H   polyethylene glycol  17 g Per Tube Daily   pregabalin  100 mg Per Tube Daily   pregabalin  200 mg Per Tube QHS   sodium chloride  flush  10-40 mL Intracatheter Q12H   sodium chloride flush  3-10 mL Intravenous Q12H   sodium chloride HYPERTONIC  4 mL Nebulization BID   Continuous Infusions:  acetaminophen Stopped (09/29/23 0305)   feeding supplement (VITAL 1.5 CAL) 50 mL/hr at 09/29/23 0347   levETIRAcetam Stopped (09/28/23 2206)   norepinephrine (LEVOPHED) Adult infusion Stopped (09/26/23 0121)   PRN Meds:.docusate, fentaNYL (SUBLIMAZE) injection, ipratropium-albuterol, mouth rinse, senna, sodium chloride flush, sodium chloride flush   Critical care time: 40 minutes    Webb Silversmith, DNP, CCRN, FNP-C, AGACNP-BC Acute Care & Family Nurse Practitioner  [facility]  See Amion for personal pager PCCM on call pager [phone] until 7 am

## 2023-09-29 NOTE — Progress Notes (Signed)
OT Cancellation Note  Patient Details Name: Calvin [name] MRN: [id number] DOB: 11/01/1950   Cancelled Treatment:    Reason Eval/Treat Not Completed: Patient not medically ready ;  PT talked with the nurses, says wound has dehisced and she wants to talk with surgery before moving him.. Will attempt when appropriate.  Oletta Cohn OTR/L,CLT 09/29/2023, 11:32 AM

## 2023-09-29 NOTE — IPAL (Signed)
Patient with increased WOB Unable to protect airway Patient emergently intubated   GOALS OF CARE DISCUSSION  The Clinical status was relayed to family in detail- Daughter and Brother updated  Updated and notified of patients medical condition- Patient remains unresponsive and will not open eyes to command.   Patient with increased WOB and using accessory muscles to breathe Explained to family course of therapy and the modalities  Patient with Progressive multiorgan failure with a very high probablity of a very minimal chance of meaningful recovery despite all aggressive and optimal medical therapy.    PATIENT REMAINS FULL CODE  Family understands the situation.  Family are satisfied with Plan of action and management. All questions answered  Additional CC time 35 mins   Azul Brumett Santiago Glad, M.D.  [facility]  Medical Director [facility] Medical Director [facility] Cardio-Pulmonary Department

## 2023-09-29 NOTE — Progress Notes (Addendum)
Pt Alert and oriented to self. Complains of constant R knee pain which he states is chronic. MD made aware and new pain medications ordered. VSS. Pt has difficulty clearing secretions with weak congested cough. NT suctioning ordered. O2 sats remain WNL on 2 L [place]. SLP following. Per surgery hold off on PT this shift but okay for SLP. Midline dressing changed and surgical site has no change in appearance.

## 2023-09-29 NOTE — Progress Notes (Signed)
No airway documentation flowsheet noted as to the positioning of et tube after patient was intubated. Found tube at 27 cm at lip vs documented note to that of Dr Belia Heman who indicated placement at 23.  Per xray, retract due to right mainstem placement. Pulled tube back to 23 cm and updated NP-Ouma.

## 2023-09-29 NOTE — Plan of Care (Signed)
  Problem: Clinical Measurements: Goal: Ability to maintain clinical measurements within normal limits will improve Outcome: Progressing Goal: Respiratory complications will improve Outcome: Progressing Goal: Cardiovascular complication will be avoided Outcome: Progressing   Problem: Clinical Measurements: Goal: Will remain free from infection Outcome: Not Progressing   Problem: Activity: Goal: Risk for activity intolerance will decrease Outcome: Not Progressing   Problem: Nutrition: Goal: Adequate nutrition will be maintained Outcome: Not Progressing   Problem: Coping: Goal: Level of anxiety will decrease Outcome: Not Progressing

## 2023-09-29 NOTE — Procedures (Signed)
Endotracheal Intubation: Patient required placement of an artificial airway secondary to Respiratory Failure  Consent: Emergent.   Hand washing performed prior to starting the procedure.   Medications administered for sedation prior to procedure:  Midazolam 4 mg IV,  Vecuronium 10 mg IV, Fentanyl 100 mcg IV.    A time out procedure was called and correct patient, name, & ID confirmed. Needed supplies and equipment were assembled and checked to include ETT, 10 ml syringe, Glidescope, Mac and Miller blades, suction, oxygen and bag mask valve, end tidal CO2 monitor.   Patient was positioned to align the mouth and pharynx to facilitate visualization of the glottis.   Heart rate, SpO2 and blood pressure was continuously monitored during the procedure. Pre-oxygenation was conducted prior to intubation and endotracheal tube was placed through the vocal cords into the trachea.     The artificial airway was placed under direct visualization via glidescope route using a 8.0 ETT on the first attempt.  ETT was secured at 23 cm mark.  Placement was confirmed by auscuitation of lungs with good breath sounds bilaterally and no stomach sounds.  Condensation was noted on endotracheal tube.   Pulse ox 98%.  CO2 detector in place with appropriate color change.   Complications: None .   Operator: Kanden Carey/Bambi.   Lucie Leather, M.D.  [facility]  Medical Director [facility] Medical Director [facility] Cardio-Pulmonary Department

## 2023-09-29 NOTE — Evaluation (Signed)
Clinical/Bedside Swallow Evaluation Patient Details  Name: Calvin [name] MRN: [id number] Date of Birth: 1951-01-21  Today's Date: 09/29/2023 Time: SLP Start Time (ACUTE ONLY): 1400 SLP Stop Time (ACUTE ONLY): 1430 SLP Time Calculation (min) (ACUTE ONLY): 30 min  Past Medical History:  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 side (HCC)    Hypertension    Myocardial infarction (HCC)    Neuromuscular disorder (HCC)    Ogilvie syndrome    Seizures (HCC)    last 6 months ago   Stroke [facility])    multiple  left side weakness   Tremors of nervous system    Unstable angina (HCC)    Past Surgical History:  Past Surgical History:  Procedure Laterality Date   CARPAL TUNNEL RELEASE Left 10/19/2015   Procedure: CARPAL TUNNEL RELEASE;  Surgeon: Kennedy Bucker, MD;  Location: [facility] ORS;  Service: Orthopedics;  Laterality: Left;   COLONOSCOPY WITH PROPOFOL N/A 10/26/2021   Procedure: COLONOSCOPY WITH PROPOFOL;  Surgeon: Regis Bill, MD;  Location: [facility] ENDOSCOPY;  Service: Endoscopy;  Laterality: N/A;   COLONOSCOPY WITH PROPOFOL N/A 06/02/2022   Procedure: COLONOSCOPY WITH PROPOFOL;  Surgeon: Regis Bill, MD;  Location: [facility] ENDOSCOPY;  Service: Endoscopy;  Laterality: N/A;   COLONOSCOPY WITH PROPOFOL N/A 06/06/2022   Procedure: COLONOSCOPY WITH PROPOFOL;  Surgeon: Regis Bill, MD;  Location: [facility] ENDOSCOPY;  Service: Endoscopy;  Laterality: N/A;   COLOSTOMY REVISION N/A 09/11/2023   Procedure: COLOSTOMY REVISION;  Surgeon: Henrene Dodge, MD;  Location: [facility] ORS;  Service: General;  Laterality: N/A;   JOINT REPLACEMENT     left partial hip    LAPAROSCOPIC SIGMOID COLECTOMY   09/12/2010   Lap hand assisted sigmoidectomy, mobilization splenic flexure -- Dr. Freida Busman   LAPAROTOMY N/A 09/11/2023   Procedure: EXPLORATORY LAPAROTOMY;  Surgeon: Henrene Dodge, MD;  Location: [facility] ORS;  Service: General;  Laterality: N/A;   LAPAROTOMY N/A 09/15/2023   Procedure: EXPLORATORY LAPAROTOMY;  Surgeon: Sung Amabile, DO;  Location: [facility] ORS;  Service: General;  Laterality: N/A;   LYSIS OF ADHESION N/A 09/11/2023   Procedure: LYSIS OF ADHESION;  Surgeon: Henrene Dodge, MD;  Location: [facility] ORS;  Service: General;  Laterality: N/A;   THROAT SURGERY  09/05/2011   cancer   HPI:  73 y.o. male admitted with PMHx most significant for colon cancer status post transverse colectomy and colostomy, presented with nausea, vomiting, and shortness of breath. Was admitted with Sepsis due to Influenza A infection, Aspiration pneumonia, and Small Bowel Obstruction. 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 Course further complicated by Aspiration event requiring intubation and mechanical ventilation on 1/17. Pt extubated 1/18- re-intubated 1/19- and now extubated on 1/24. Pt NPO.    Assessment / Plan / Recommendation  Clinical Impression  Pt with known oropharyngeal dsyphagia as per last bedside swallow evaluation on 1/15/25with recommendations of dysphagia level 1 (puree) diet w/ moistened foods; Nectar consistency liquids and FEES completed on 07/20/23, "Pt demosntrates moderate oropharyngeal dysphagia. He is unable to masticate given no dentition and  perseverative behavior on particulate matter in his mouth. He initiates laryngeal closure in a timely manner, but has almost absent epiglottic deflection and vestibular closure as well as a horizonal pharynx, so he penetrated all liquids to the level of the glottis. There is somewhat slow PES opening with some backflow and decreased  negative pharyngeal pressure. Pt senses and ejects penetrate and aspirate (he has very errythemtous false vocal folds likely due to frequent coughing). There is significant pharyngeal residue with thick liquids and this is harder for pt to clear than thins. If a liquid wash is given, pt can clear puree residue from valleculae. Pts swallowing function is chronically impaired and he does tolerate thin liquids at baseline. Recommend initaiting full liquids."       Pt seen for swallow assessment s/p extubation on 1/24. Pt on 2L nasal canula with O2 saturations maintained at 95% upon therapist entrance. Oral completed with swab attached to suction, oral cavity notably dry. Vocal quality hoarse, with weak, congested cough at baseline. Single trial of ice chip completed with immediate cough, with prolonged recovery and O2 saturations dropping to low 90s. Oral suction provided, though pt with minimal oral secretions to expel. No further PO trials secondary to significant risk of aspiration. Recommend continued NPO with frequent oral care. RN and MD aware.  SLP Visit Diagnosis: Dysphagia, oropharyngeal phase (R13.12)    Aspiration Risk  Severe aspiration risk    Diet Recommendation   NPO       Other  Recommendations Oral Care Recommendations: Oral care QID Caregiver Recommendations: Have oral suction available    Recommendations for follow up therapy are one component of a multi-disciplinary discharge planning process, led by the attending physician.  Recommendations may be updated based on patient status, additional functional criteria and insurance authorization.  Follow up Recommendations Follow physician's recommendations for discharge plan and follow up therapies      Assistance Recommended at Discharge    Functional Status Assessment Patient has had a recent decline in their functional status and demonstrates the ability to make significant improvements in function in a reasonable and predictable  amount of time.  Frequency and Duration min 2x/week  2 weeks       Prognosis Prognosis for improved oropharyngeal function: Fair Barriers to Reach Goals: Severity of deficits;Time post onset (baseline dysphagia)      Swallow Study   General Date of Onset: 09/29/23 HPI: 73 y.o. male admitted with PMHx most significant for colon cancer status post transverse colectomy and colostomy, presented with nausea, vomiting, and shortness of breath. Was admitted with Sepsis due to Influenza A infection, Aspiration pneumonia, and Small Bowel Obstruction. 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 Course further complicated by Aspiration event requiring intubation and mechanical ventilation on 1/17. Pt extubated 1/18- re-intubated 1/19- and now extubated on 1/24. Pt NPO. Type of Study: Bedside Swallow Evaluation Previous Swallow Assessment: BSE on 1/15; FEES on 07/20/23 Diet Prior to this Study: NPO Temperature Spikes Noted: No (Temp 98.7; WBC 12.8) Respiratory Status: Nasal cannula (2L) History of Recent Intubation: Yes Total duration of intubation (days): 7 days (intubated 1/17. Pt extubated 1/18- re-intubated 1/19- and now extubated on 1/24.) Date extubated: 09/28/23 Behavior/Cognition: Alert;Confused;Distractible Oral Cavity Assessment: Dry;Dried secretions Oral Care Completed by SLP: Yes Oral Cavity - Dentition: Missing dentition Patient Positioning: Upright in bed Baseline Vocal Quality: Low vocal intensity;Hoarse Volitional Cough: Congested;Weak Volitional Swallow:  Unable to elicit    Oral/Motor/Sensory Function Overall Oral Motor/Sensory Function: Generalized oral weakness   Ice Chips Ice chips: Impaired Presentation: Cup Oral Phase Impairments: Poor awareness of bolus Pharyngeal Phase Impairments: Wet Vocal Quality;Cough - Immediate;Change in Vital Signs   Thin  Liquid Thin Liquid: Not tested    Nectar Thick Nectar Thick Liquid: Not tested   Honey Thick Honey Thick Liquid: Not tested   Puree Puree: Not tested   Solid     Solid: Not tested     [place] Laurina Fischl Clapp  MS [facility] SLP   [place] J Clapp 09/29/2023,3:57 PM

## 2023-09-29 NOTE — Progress Notes (Signed)
CC: Fascial Dehiscence Subjective: Called by his primary team overnight due to concern for increased dehiscence.  It appears that there is some pink probably bowel that is abutting the skin and open to air.  Patient seen this morning and he is alert to his name.  There is some increased separation in the middle of the wound.  There is pink mucosa appearing tissue.  There is no draining of any bilious fluid.  He is doing well hemodynamically and is awaiting for SLP evaluation.  Objective: Vital signs in last 24 hours: Temp:  [97.9 F (36.6 C)-98.7 F (37.1 C)] 98.1 F (36.7 C) (01/25 1136) Pulse Rate:  [72-113] 96 (01/25 1500) Resp:  [14-34] 19 (01/25 1500) BP: (128-162)/(70-133) 145/99 (01/25 1500) SpO2:  [90 %-100 %] 97 % (01/25 1500) Weight:  [93 kg] 93 kg (01/25 0410) Last BM Date : 09/29/23  Intake/Output from previous day: 01/24 0701 - 01/25 0700 In: 1592.5 [I.V.:52.8; NG/GT:1039.2; IV Piggyback:500.4] Out: 1600 [Urine:1550; Stool:50] Intake/Output this shift: Total I/O In: 590 [NG/GT:390; IV Piggyback:200] Out: 360 [Urine:300; Stool:60]  Physical exam:  This superior and inferior edges of his wound look well.  The retention sutures are in place and he has staples in place without any separation.  Just the middle of a separation there is an area exposed that is approximately 1-1/2 cm with pink mucosa appearing tissue underneath.  I am not 100% sure that this is bowel but I did not probe the wound very much as I did not want to further exacerbate the problem.  Lab Results: CBC  Recent Labs    09/28/23 0308 09/29/23 0401  WBC 14.9* 12.8*  HGB 7.9* 8.4*  HCT 26.7* 27.4*  PLT 345 365   BMET Recent Labs    09/28/23 0308 09/29/23 0401  NA 146* 145  K 4.7 4.7  CL 112* 108  CO2 23 24  GLUCOSE 177* 100*  BUN 38* 31*  CREATININE 0.93 0.89  CALCIUM 8.2* 8.4*   PT/INR No results for input(s): "LABPROT", "INR" in the last 72 hours. ABG Recent Labs    09/27/23 0813   HCO3 25.4    Studies/Results: DG Abd 1 View Result Date: 09/29/2023 CLINICAL DATA:  Ileus. EXAM: ABDOMEN - 1 VIEW COMPARISON:  09/26/2023 FINDINGS: Short segment gas distended bowel the central abdomen measures about 4.7 cm diameter. Otherwise no gas dilated bowel loops evident. Contrast material is seen in a nondilated left colon. Retention sutures overlie the midline. Bones are demineralized. Status post left hip replacement. IMPRESSION: Short segment gas distended bowel in the central abdomen measures about 4.7 cm diameter. Imaging features could be compatible with ileus. Electronically Signed   By: Kennith Center M.D.   On: 09/29/2023 08:53    Anti-infectives: Anti-infectives (From admission, onward)    Start     Dose/Rate Route Frequency Ordered Stop   09/23/23 1100  vancomycin (VANCOCIN) IVPB 1000 mg/200 mL premix  Status:  Discontinued        1,000 mg 200 mL/hr over 60 Minutes Intravenous Every 12 hours 09/23/23 0811 09/24/23 1021   09/22/23 2300  vancomycin (VANCOCIN) IVPB 1000 mg/200 mL premix  Status:  Discontinued        1,000 mg 200 mL/hr over 60 Minutes Intravenous Every 24 hours 09/22/23 0819 09/23/23 0811   09/22/23 1000  vancomycin (VANCOREADY) IVPB 1500 mg/300 mL        1,500 mg 150 mL/hr over 120 Minutes Intravenous  Once 09/22/23 0819 09/22/23 1916   09/21/23  0815  piperacillin-tazobactam (ZOSYN) IVPB 3.375 g        3.375 g 12.5 mL/hr over 240 Minutes Intravenous Every 8 hours 09/21/23 0729 09/27/23 2115   09/11/23 2000  cefoTEtan (CEFOTAN) 2 g in sodium chloride 0.9 % 100 mL IVPB        2 g 200 mL/hr over 30 Minutes Intravenous Every 8 hours 09/11/23 1801 09/12/23 1910   09/11/23 0600  cefoTEtan (CEFOTAN) 2 g in sodium chloride 0.9 % 100 mL IVPB        2 g 200 mL/hr over 30 Minutes Intravenous On call to O.R. 09/10/23 0858 09/12/23 1910   09/04/23 1200  Ampicillin-Sulbactam (UNASYN) 3 g in sodium chloride 0.9 % 100 mL IVPB  Status:  Discontinued        3 g 200  mL/hr over 30 Minutes Intravenous Every 6 hours 09/04/23 1052 09/08/23 1537   09/03/23 1800  ceFEPIme (MAXIPIME) 2 g in sodium chloride 0.9 % 100 mL IVPB  Status:  Discontinued        2 g 200 mL/hr over 30 Minutes Intravenous Every 12 hours 09/03/23 0812 09/04/23 0903   09/03/23 1500  metroNIDAZOLE (FLAGYL) IVPB 500 mg  Status:  Discontinued        500 mg 100 mL/hr over 60 Minutes Intravenous Every 12 hours 09/03/23 0758 09/04/23 0903   09/03/23 1000  oseltamivir (TAMIFLU) capsule 30 mg        30 mg Oral 2 times daily 09/03/23 0757 09/08/23 0959   09/03/23 0315  ceFEPIme (MAXIPIME) 2 g in sodium chloride 0.9 % 100 mL IVPB        2 g 200 mL/hr over 30 Minutes Intravenous  Once 09/03/23 0304 09/03/23 0441   09/03/23 0315  metroNIDAZOLE (FLAGYL) IVPB 500 mg        500 mg 100 mL/hr over 60 Minutes Intravenous  Once 09/03/23 0304 09/03/23 0441   09/03/23 0315  vancomycin (VANCOCIN) IVPB 1000 mg/200 mL premix        1,000 mg 200 mL/hr over 60 Minutes Intravenous  Once 09/03/23 0304 09/03/23 0441       Assessment/Plan:  Patient with dehiscence requiring return to the operating room who now has a wound separation.  There is likely bowel underneath this.  At this time he is hemodynamically normal and the superior and inferior parts of the wound look to be healing okay.  If this separates further then he may need to return to the operating room but that would be a last resort given his frailty.  At this time would allow for SLP to evaluate his ability to swallow to see if he can start a diet.  I would hold off on any physical therapy or occupational therapy right now.  Please place Xeroform gauze over the wound followed by gauze.  We will monitor this over the next 24 to 48 hours.  Baker Pierini, M.D. [facility]

## 2023-09-30 DIAGNOSIS — J9601 Acute respiratory failure with hypoxia: Secondary | ICD-10-CM | POA: Diagnosis not present

## 2023-09-30 LAB — GLUCOSE, CAPILLARY
Glucose-Capillary: 100 mg/dL — ABNORMAL HIGH (ref 70–99)
Glucose-Capillary: 109 mg/dL — ABNORMAL HIGH (ref 70–99)
Glucose-Capillary: 117 mg/dL — ABNORMAL HIGH (ref 70–99)
Glucose-Capillary: 122 mg/dL — ABNORMAL HIGH (ref 70–99)
Glucose-Capillary: 86 mg/dL (ref 70–99)
Glucose-Capillary: 93 mg/dL (ref 70–99)
Glucose-Capillary: 99 mg/dL (ref 70–99)

## 2023-09-30 LAB — RENAL FUNCTION PANEL
Albumin: 2.4 g/dL — ABNORMAL LOW (ref 3.5–5.0)
Anion gap: 10 (ref 5–15)
BUN: 37 mg/dL — ABNORMAL HIGH (ref 8–23)
CO2: 26 mmol/L (ref 22–32)
Calcium: 8.3 mg/dL — ABNORMAL LOW (ref 8.9–10.3)
Chloride: 111 mmol/L (ref 98–111)
Creatinine, Ser: 1 mg/dL (ref 0.61–1.24)
GFR, Estimated: >60 mL/min (ref >60)
Glucose, Bld: 114 mg/dL — ABNORMAL HIGH (ref 70–99)
Phosphorus: 5.4 mg/dL — ABNORMAL HIGH (ref 2.5–4.6)
Potassium: 5.1 mmol/L (ref 3.5–5.1)
Sodium: 147 mmol/L — ABNORMAL HIGH (ref 135–145)

## 2023-09-30 LAB — CBC
HCT: 26.7 % — ABNORMAL LOW (ref 39.0–52.0)
Hemoglobin: 8 g/dL — ABNORMAL LOW (ref 13.0–17.0)
MCH: 28.4 pg (ref 26.0–34.0)
MCHC: 30 g/dL (ref 30.0–36.0)
MCV: 94.7 fL (ref 80.0–100.0)
Platelets: 326 10*3/uL (ref 150–400)
RBC: 2.82 MIL/uL — ABNORMAL LOW (ref 4.22–5.81)
RDW: 16 % — ABNORMAL HIGH (ref 11.5–15.5)
WBC: 8.5 10*3/uL (ref 4.0–10.5)
nRBC: 0.7 % — ABNORMAL HIGH (ref 0.0–0.2)

## 2023-09-30 LAB — TRIGLYCERIDES: Triglycerides: 161 mg/dL — ABNORMAL HIGH (ref <150)

## 2023-09-30 MED ORDER — SODIUM CHLORIDE 0.9 % IV SOLN
0.5000 mg/h | INTRAVENOUS | Status: DC
  Administered 2023-09-30: 3.5 mg/h via INTRAVENOUS
  Administered 2023-09-30: 4 mg/h via INTRAVENOUS
  Administered 2023-10-01 – 2023-10-06 (×7): 3 mg/h via INTRAVENOUS
  Filled 2023-09-30 (×10): qty 5

## 2023-09-30 MED ORDER — ORAL CARE MOUTH RINSE: 15.0000 mL | OROMUCOSAL | Status: DC | PRN

## 2023-09-30 MED ORDER — HEPARIN SODIUM (PORCINE) 5000 UNIT/ML IJ SOLN
5000.0000 [IU] | Freq: Three times a day (TID) | INTRAMUSCULAR | Status: AC
  Administered 2023-09-30 – 2023-10-01 (×5): 5000 [IU] via SUBCUTANEOUS
  Filled 2023-09-30 (×5): qty 1

## 2023-09-30 MED ORDER — ORAL CARE MOUTH RINSE
15.0000 mL | OROMUCOSAL | Status: DC
  Administered 2023-09-30 – 2023-10-06 (×75): 15 mL via OROMUCOSAL

## 2023-09-30 MED ORDER — FREE WATER
100.0000 mL | Status: DC
  Administered 2023-09-30 – 2023-10-01 (×7): 100 mL

## 2023-09-30 NOTE — Progress Notes (Signed)
NAME:  Calvin [name], MRN:  [id number], DOB:  [birth year], LOS: 27 ADMISSION DATE:  09/03/2023, CONSULTATION DATE:  09/21/2023 REFERRING MD:  Manuela Schwartz, NP, CHIEF COMPLAINT:  Acute Respiratory Distress, Aspiration event   Brief Pt Description / Synopsis:  73 y.o. male admitted with PMHx most significant for colon cancer status post transverse colectomy and colostomy, presented with nausea, vomiting, and shortness of breath.  Was admitted with Sepsis due to Influenza A infection, Aspiration pneumonia, and Small Bowel Obstruction.  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  Course further complicated by Aspiration event requiring intubation and mechanical ventilation on 1/17.  Has been unable to tolerate extubation on 2 occasions due to inability to manage secretions and protect his airway.  Plan for Tracheostomy placement.  History of Present Illness:  Calvin [name] is a 73 y.o. 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   Was admitted by [facility] for further workup and treatment of Was admitted with Sepsis due to Influenza A infection, Aspiration pneumonia, and Small Bowel Obstruction.  Please see "Significant Hospital Events" section below for full detailed hospital course.  Pertinent  Medical History   Past Medical History:  Diagnosis Date   Alcohol abuse    drinks on weekend   Anemia    Anxiety    Arthritis    Cancer (HCC)    colon,throat   COPD (chronic obstructive pulmonary disease) (HCC)    Coronary artery disease    Depression    Gout    Hemiplegia and  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 side (HCC)    Hypertension    Myocardial infarction (HCC)    Neuromuscular disorder (HCC)    Ogilvie syndrome    Seizures (HCC)    last 6 months ago   Stroke [facility])    multiple  left side weakness   Tremors of nervous system    Unstable angina (HCC)      Micro Data:  12/30: SARS-CoV-2/RSV/Flu PCR>>+ Influenza A 12/30: Blood cultures x2>>negative  12/30: Urine>>Staphylococcus Aureus 12/30: MRSA PCR + 1/17: Tracheal aspirate>>moderate pseudomonas  1/18: MRSA PCR +  Anti-infectives (From admission, onward)    Start     Dose/Rate Route Frequency Ordered Stop   09/23/23 1100  vancomycin (VANCOCIN) IVPB 1000 mg/200 mL premix  Status:  Discontinued        1,000 mg 200 mL/hr over 60 Minutes Intravenous Every 12 hours 09/23/23 0811 09/24/23 1021   09/22/23 2300  vancomycin (VANCOCIN) IVPB 1000 mg/200 mL premix  Status:  Discontinued        1,000 mg 200 mL/hr over 60 Minutes Intravenous Every 24 hours 09/22/23 0819 09/23/23 0811   09/22/23 1000  vancomycin (VANCOREADY) IVPB 1500 mg/300 mL        1,500 mg 150 mL/hr over 120 Minutes Intravenous  Once 09/22/23 0819 09/22/23 1916   09/21/23 0815  piperacillin-tazobactam (ZOSYN) IVPB 3.375 g        3.375 g 12.5 mL/hr over 240 Minutes Intravenous Every 8 hours 09/21/23 0729 09/27/23 2115   09/11/23 2000  cefoTEtan (CEFOTAN) 2 g in sodium chloride 0.9 % 100 mL IVPB  2 g 200 mL/hr over 30 Minutes Intravenous Every 8 hours 09/11/23 1801 09/12/23 1910   09/11/23 0600  cefoTEtan (CEFOTAN) 2 g in sodium chloride 0.9 % 100 mL IVPB        2 g 200 mL/hr over 30 Minutes Intravenous On call to O.R. 09/10/23 0858 09/12/23 1910   09/04/23 1200  Ampicillin-Sulbactam (UNASYN) 3 g in sodium chloride 0.9 % 100 mL IVPB  Status:  Discontinued        3 g 200 mL/hr over 30 Minutes Intravenous Every 6 hours 09/04/23 1052 09/08/23 1537   09/03/23 1800  ceFEPIme (MAXIPIME) 2 g in sodium  chloride 0.9 % 100 mL IVPB  Status:  Discontinued        2 g 200 mL/hr over 30 Minutes Intravenous Every 12 hours 09/03/23 0812 09/04/23 0903   09/03/23 1500  metroNIDAZOLE (FLAGYL) IVPB 500 mg  Status:  Discontinued        500 mg 100 mL/hr over 60 Minutes Intravenous Every 12 hours 09/03/23 0758 09/04/23 0903   09/03/23 1000  oseltamivir (TAMIFLU) capsule 30 mg        30 mg Oral 2 times daily 09/03/23 0757 09/08/23 0959   09/03/23 0315  ceFEPIme (MAXIPIME) 2 g in sodium chloride 0.9 % 100 mL IVPB        2 g 200 mL/hr over 30 Minutes Intravenous  Once 09/03/23 0304 09/03/23 0441   09/03/23 0315  metroNIDAZOLE (FLAGYL) IVPB 500 mg        500 mg 100 mL/hr over 60 Minutes Intravenous  Once 09/03/23 0304 09/03/23 0441   09/03/23 0315  vancomycin (VANCOCIN) IVPB 1000 mg/200 mL premix        1,000 mg 200 mL/hr over 60 Minutes Intravenous  Once 09/03/23 0304 09/03/23 0441      Significant Hospital Events: Including procedures, antibiotic start and stop dates in addition to other pertinent events   12/30: admitted to hospitalist sepsis, AKI, (+)Influenza A, likely aspiration pneumonia, acute hypoxic resp fail, SBO. NG placed, surgical consult.  12/31-01/06: relatively stable, remains SBO w/ NG and on TPN, worsening colostomy prolapse. Anemia w/o overt GIB, holding anti-plt/anti-cogs, GI no plans for endoscopic eval at this time 01/07: exlap, LOA, and colostomy revision  01/08: stable.  01/09: d/c NG 01/10: advancing diet as able. Hgb 6.8 - 1 unit PRBC given  01/11: persistent abd pain and cough, imaging concerning for abdominal surgical wound dehiscence deeper layers, going back to OR. Continue w/ cough suppression, await over-read but no obvious CT chest findings, suspect post-viral bronchitis  01/12: to OR early AM for closure of wound dehiscence. Postop stable, NG in place. CT chest question pneumonia but pt reports coughing improved, suspect was mostly reflux, will continue to monitor cough.  Postop care per general surgery include clamp NG and remain NPO for now   01/13: NG out, CLD 01/14: FLD, continue advancing diet tolerated  1/15: Advanced to dysphagia diet after ST eval.  Discontinue telemetry, palliative care consult, DC central line 1/16: Underwent CT abdomen/pelvis last evening.  Not a candidate for another surgery due to poor functional and nutritional status and multiple recent procedures.  Family deciding on goals of care/hospice 1/17: Aspirated this morning with development of severe respiratory distress and hypoxia requiring emergent intubation.  Now with developing septic shock.  Pt's daughter requesting transfer to [facility] or [facility].  Dr. Aleen Campi spoke both facilities which are at capacity and not accepting transfers. 1/18: Pt remains mechanically intubated overnight required increased  dose of fentanyl and vecuronium x1 dose due to vent dyssynchrony.  Pt currently unable to follow commands RASS goal -4.  Will perform WUA and for possible SBT.  Pt requiring levophed gtt @5  mcg/min and vasopressin 0.03 units/min to maintain map 65 or higher.  Pt extubated to Bipap able to follow commands  1/19: Pt with increased work of breathing due to increased secretions despite scheduled hypertonic saline, Chest PT, and Bipap requiring reintubation  1/20: On minimal vent settings, will keep intubated today as required reintubation yesterday.  Off Vasopressors. KUB with improving ileus, General Surgery ok with starting trickle feeds 1/21: On minimal vent settings.  Daughter at bedside for SBT, FAILED SBT due to tachypnea and increased WOB. 1/22: Pt remains mechanically intubated on PRVC vent settings PEEP 5/FiO2 30% with increased work of breathing; vbg revealed metabolic acidosis requiring 2 amps of bicarb  1/23: Pt remains mechanically intubated on minimal settings.  Tolerating TF's.  Will continue to optimize respiratory status today and plan for SBT on 01/24 with daughter at bedside  1/24:  On  minimal vents settings, daughter at bedside for WUA and SBT.  Following commands, EXTUBATED. 1/25: Difficultly managing secretions, required REINTUBATION.  Abdominal wound with worsening dehiscence, Surgery holding off on return to OR unless wound separates further given his frailty 1/26: No SBT today due to reintubation yesterday. Will consult ENT for Tracheostomy given 2 failed extubation attempts.  Off vasopressors.  Interim History / Subjective:  As outlined above under significant events   Objective   Blood pressure 106/61, pulse 68, temperature 98.3 F (36.8 C), temperature source Axillary, resp. rate 18, height 5' 10.98" (1.803 m), weight 93.1 kg, SpO2 98%.    Vent Mode: PRVC FiO2 (%):  [30 %-35 %] 35 % Set Rate:  [18 bmp] 18 bmp Vt Set:  [500 mL] 500 mL PEEP:  [5 cmH20] 5 cmH20 Plateau Pressure:  [17 cmH20] 17 cmH20   Intake/Output Summary (Last 24 hours) at 09/30/2023 0831 Last data filed at 09/30/2023 0600 Gross per 24 hour  Intake 1469.31 ml  Output 810 ml  Net 659.31 ml   Filed Weights   09/28/23 0500 09/29/23 0410 09/30/23 0436  Weight: 90.9 kg 93 kg 93.1 kg   Examination: General: Acute on chronically-ill appearing male, mechanically intubated and sedated, in NAD HENT: Atraumatic, normocephalic, neck supple, orally intubated Lungs: Mechanical breath sounds throughout, even, synchronous with the vent Cardiovascular: NSR, s1s2, no m/r/g, 2+ radial/1+ distal pulses, no edema Abdomen: +BS x4, soft, non distended, colostomy and mucous fistula in left upper quadrant dressing dry/intact, stool in bag dressing dry/intact, abdominal binder in place Extremities: Normal bulk and tone, no deformities Neuro: Sedated, withdraws from pain but currently not following commands, pupils PERRL GU: External catheter draining yellow urine   Resolved Hospital Problem list     Assessment & Plan:   #Acute metabolic encephalopathy #Mechanical intubation pain/discomfort  Hx: Seizures   - Maintain a RASS goal of 0 to -1 - PAD protocol to maintain RASS goal: Dilaudid and Versed drips as needed - Avoid sedating medications as able - Continue scheduled baclofen  - Daily wake up assessment - Continue lamictal and keppra - Seizure precautions   #Acute hypoxic & hypercapnic respiratory failure #Large volume aspiration #COPD without acute exacerbation #Influenza A infection (present on admission) Failed 2 Extubation attempts - Full vent support for now  - Continue lung protective strategies - Maintain plateau pressures less than 30 cm H20 - Wean FiO2 & PEEP as tolerated  to maintain O2 sats >92% - Follow intermittent CXR & ABG as needed - SBT once respiratory parameters met and mental status permits - Implement VAP Bundle - Scheduled and prn bronchodilator therapy  - IV steroids - ABX as outlined above - Consult ENT for Tracheostomy  #Shock: suspect septic +/- hypovolemic ~ RESOLVED #Chronic HFpEF Echocardiogram 09/22/23: EF 50%; left ventricle demonstrates global hypokinesis; mildly elevated pulmonary artery systolic pressure; large pleural effusion in the left lateral region; trivial mitral valve regurgitation; trivial aortic valve regurgitation  - Continuous telemetry monitoring - Trend lactic acid until normalized - Troponin peaked at 25 - Diuresis as BP and renal function permits  #AKI on CKD stage II~RESOLVED #Mild Hypernatremia #BPH - Trend BMP - Replace electrolytes as indicated  - Ensure adequate renal perfusion - Avoid nephrotoxic agents as able - Replace electrolytes as indicated ~ Pharmacy following for assistance with electrolyte replacement - Start Free water flushes 100 ml q4h  #Aspiration pneumonia #Pseudomonas pneumonia ~ TREATED  #Influenza Infection (POA)~TREATED - Trend WBC and monitor fever curve - Trend PCT   - Follow cultures as above - Completed 7 day course of ABX as above  #Small bowel obstruction vs ileus #Colostomy  irrigation/prolapse #Hx: ogilvie syndrome s/p colostomy - NPO; OGT to LIS - General Surgery following, appreciate input: continue TF's   #Anemia - Trend CBC  - Monitor for s/sx of bleeding  - Transfuse for hgb <7   #Prediabetes - CBG's q4hrs - Target range of 140 to 180 - SSI - Follow ICU Hypo/Hyperglycemia protocol   Patient is critically ill with shock and multiorgan failure.  Prognosis is guarded, high risk for further decompensation, cardiac arrest and death.  Given current critical illness superimposed on multiple chronic comorbidities including colon cancer, along with advanced age, overall long-term prognosis is poor.  Recommend consideration for DNR status.  Palliative care is following for ongoing goals of care discussions.  Patient's family currently requests full code and aggressive measures.   Best Practice (right click and "Reselect all SmartList Selections" daily)   Diet/type: TF's DVT prophylaxis: Lovenox  GI prophylaxis: PPI Lines: Central line and yes and it is still needed  Foley:  N/A  Code Status:  full code Last date of multidisciplinary goals of care discussion [09/30/23]  09/30/23: Will update pt's family when they arrive at bedside.  Labs   CBC: Recent Labs  Lab 09/26/23 0535 09/27/23 0403 09/28/23 0308 09/29/23 0401 09/30/23 0359  WBC 21.1* 17.9* 14.9* 12.8* 8.5  NEUTROABS  --  12.3* 8.4*  --   --   HGB 8.1* 7.5* 7.9* 8.4* 8.0*  HCT 27.3* 24.7* 26.7* 27.4* 26.7*  MCV 95.5 92.9 95.0 93.8 94.7  PLT 281 279 345 365 326    Basic Metabolic Panel: Recent Labs  Lab 09/25/23 0435 09/25/23 1652 09/26/23 0535 09/27/23 0403 09/28/23 0308 09/29/23 0401 09/30/23 0351  NA 140  --  141 145 146* 145 147*  K 3.9  --  3.6 4.4 4.7 4.7 5.1  CL 112*  --  111 113* 112* 108 111  CO2 20*  --  21* 24 23 24 26   GLUCOSE 138*  --  179* 200* 177* 100* 114*  BUN 41*  --  37* 37* 38* 31* 37*  CREATININE 0.93  --  0.84 1.00 0.93 0.89 1.00  CALCIUM 8.0*  --   8.2* 8.1* 8.2* 8.4* 8.3*  MG 2.1 2.1 2.0 2.1 2.3  --   --   PHOS 3.2 5.3* 2.3* 3.2  3.1 4.7* 5.4*   GFR: Estimated Creatinine Clearance: 75.5 mL/min (by C-G formula based on SCr of 1 mg/dL). Recent Labs  Lab 09/27/23 0403 09/28/23 0308 09/29/23 0401 09/30/23 0359  WBC 17.9* 14.9* 12.8* 8.5    Liver Function Tests: Recent Labs  Lab 09/24/23 0436 09/25/23 0435 09/26/23 0535 09/29/23 0401 09/30/23 0351  AST 12*  --   --   --   --   ALT 16  --   --   --   --   ALKPHOS 74  --   --   --   --   BILITOT 0.6  --   --   --   --   PROT 6.1*  --   --   --   --   ALBUMIN 2.0* 1.8* 1.9* 2.4* 2.4*   No results for input(s): "LIPASE", "AMYLASE" in the last 168 hours. No results for input(s): "AMMONIA" in the last 168 hours.  ABG    Component Value Date/Time   PHART 7.47 (H) 09/29/2023 1729   PCO2ART 38 09/29/2023 1729   PO2ART 62 (L) 09/29/2023 1729   HCO3 27.7 09/29/2023 1729   ACIDBASEDEF 0.8 09/27/2023 0813   O2SAT 92.6 09/29/2023 1729     Coagulation Profile: No results for input(s): "INR", "PROTIME" in the last 168 hours.  Cardiac Enzymes: No results for input(s): "CKTOTAL", "CKMB", "CKMBINDEX", "TROPONINI" in the last 168 hours.  HbA1C: Hgb A1c MFr Bld  Date/Time Value Ref Range Status  05/28/2022 06:17 PM 5.8 (H) 4.8 - 5.6 % Final    Comment:    (NOTE) Pre diabetes:          5.7%-6.4%  Diabetes:              >6.4%  Glycemic control for   <7.0% adults with diabetes   08/09/2019 07:31 AM 5.7 (H) 4.8 - 5.6 % Final    Comment:    (NOTE) Pre diabetes:          5.7%-6.4% Diabetes:              >6.4% Glycemic control for   <7.0% adults with diabetes     CBG: Recent Labs  Lab 09/29/23 1521 09/29/23 1949 09/29/23 2349 09/30/23 0342 09/30/23 0746  GLUCAP 99 105* 98 100* 93    Review of Systems:   Unable to assess due to Intubation/sedation/critical illness  Past Medical History:  He,  has a past medical history of Alcohol abuse, Anemia, Anxiety,  Arthritis, Cancer (HCC), COPD (chronic obstructive pulmonary disease) (HCC), Coronary artery disease, Depression, Gout, Hemiplegia and 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 side (HCC), Hypertension, Myocardial infarction (HCC), Neuromuscular disorder (HCC), Ogilvie syndrome, Seizures (HCC), Stroke (HCC), Tremors of nervous system, and Unstable angina (HCC).   Surgical History:   Past Surgical History:  Procedure Laterality Date   CARPAL TUNNEL RELEASE Left 10/19/2015   Procedure: CARPAL TUNNEL RELEASE;  Surgeon: Kennedy Bucker, MD;  Location: [facility];  Service: Orthopedics;  Laterality: Left;   COLONOSCOPY WITH PROPOFOL N/A 10/26/2021   Procedure: COLONOSCOPY WITH PROPOFOL;  Surgeon: Regis Bill, MD;  Location: [facility];  Service: Endoscopy;  Laterality: N/A;   COLONOSCOPY WITH PROPOFOL N/A 06/02/2022   Procedure: COLONOSCOPY WITH PROPOFOL;  Surgeon: Regis Bill, MD;  Location: [facility];  Service: Endoscopy;  Laterality: N/A;   COLONOSCOPY WITH PROPOFOL N/A 06/06/2022   Procedure: COLONOSCOPY WITH PROPOFOL;  Surgeon: Regis Bill, MD;  Location: [facility];  Service: Endoscopy;  Laterality: N/A;  COLOSTOMY REVISION N/A 09/11/2023   Procedure: COLOSTOMY REVISION;  Surgeon: Henrene Dodge, MD;  Location: [facility];  Service: General;  Laterality: N/A;   JOINT REPLACEMENT     left partial hip    LAPAROSCOPIC SIGMOID COLECTOMY  09/12/2010   Lap hand assisted sigmoidectomy, mobilization splenic flexure -- Dr. Freida Busman   LAPAROTOMY N/A 09/11/2023   Procedure: EXPLORATORY LAPAROTOMY;  Surgeon: Henrene Dodge, MD;  Location: [facility];  Service: General;  Laterality: N/A;   LAPAROTOMY N/A 09/15/2023   Procedure: EXPLORATORY LAPAROTOMY;  Surgeon: Sung Amabile, DO;  Location: [facility];  Service: General;  Laterality: N/A;   LYSIS OF ADHESION N/A 09/11/2023   Procedure: LYSIS OF ADHESION;  Surgeon: Henrene Dodge, MD;  Location: [facility];   Service: General;  Laterality: N/A;   THROAT SURGERY  09/05/2011   cancer     Social History:   reports that he has quit smoking. His smoking use included cigarettes. He has never used smokeless tobacco. He reports that he does not currently use alcohol. He reports that he does not use drugs.   Family History:  His family history includes Cancer in his mother; Hypertension in his father; Leukemia in his brother.   Allergies No Known Allergies   Home Medications  Prior to Admission medications   Medication Sig Start Date End Date Taking? Authorizing Provider  aspirin 81 MG chewable tablet Take 81 mg by mouth daily.   Yes [provider]  baclofen (LIORESAL) 5 mg TABS tablet Take 5 mg by mouth 2 (two) times daily.   Yes [provider]  diazePAM, 20 MG Dose, (VALTOCO 20 MG DOSE) 2 x 10 MG/0.1ML LQPK Place 20 mg into the nose as needed (seizure lasting over 2 minutes). 08/23/23  Yes Leroy Sea, MD  ipratropium-albuterol (DUONEB) 0.5-2.5 (3) MG/3ML SOLN Inhale 3 mLs into the lungs every 6 (six) hours as needed. 09/02/23  Yes [provider]  lactulose (CHRONULAC) 10 GM/15ML solution Take 20 g by mouth daily. 08/15/23  Yes [provider]  lamoTRIgine (LAMICTAL) 25 MG tablet Take 50 mg by mouth 2 (two) times daily.   Yes [provider]  levETIRAcetam (KEPPRA) 1000 MG tablet Take 1 tablet (1,000 mg total) by mouth 2 (two) times daily. 08/23/23  Yes Leroy Sea, MD  Melatonin 10 MG TABS Take 10 mg by mouth at bedtime.   Yes [provider]  nitroGLYCERIN (NITROSTAT) 0.4 MG SL tablet Place 0.4 mg under the tongue 3 (three) times daily as needed for chest pain. Every 5 minutes up to 3 doses 08/24/23  Yes [provider]  pantoprazole (PROTONIX) 40 MG tablet Take 1 tablet (40 mg total) by mouth daily. 08/23/23  Yes Leroy Sea, MD  polyethylene glycol (MIRALAX / GLYCOLAX) 17 g packet Take 17 g by mouth 2 (two) times  daily. 08/19/23  Yes Wieting, Richard, MD  pregabalin (LYRICA) 100 MG capsule Take 200 mg by mouth at bedtime.   Yes [provider]  senna (SENOKOT) 8.6 MG TABS tablet Take 2 tablets (17.2 mg total) by mouth at bedtime as needed for mild constipation or moderate constipation. 06/26/23  Yes Sunnie Nielsen, DO  tamsulosin (FLOMAX) 0.4 MG CAPS capsule Take 0.4 mg by mouth every evening.   Yes [provider]  divalproex (DEPAKOTE) 125 MG DR tablet Take 4 tablets (500 mg total) by mouth 2 (two) times daily for 1 day, THEN 4 tablets (500 mg total) daily for 3 days, THEN 2 tablets (  250 mg total) daily for 3 days, THEN 1 tablet (125 mg total) daily for 3 days. Patient not taking: Reported on 09/03/2023 08/23/23 09/03/23  Leroy Sea, MD  fluticasone [facility]) 50 MCG/ACT nasal spray Place 2 sprays into both nostrils daily. Patient not taking: Reported on 09/03/2023 05/15/23   [provider]  Mouthwashes (MOUTH RINSE) LIQD solution 15 mLs by Mouth Rinse route every 2 (two) hours. Patient not taking: Reported on 09/03/2023 08/19/23   Alford Highland, MD  pregabalin (LYRICA) 50 MG capsule Take 50 mg capsule twice daily and 100 mg (two capsules) at bedtime Patient not taking: Reported on 09/03/2023 08/23/23   Leroy Sea, MD  tiotropium (SPIRIVA) 18 MCG inhalation capsule Place 18 mcg into inhaler and inhale daily. Patient not taking: Reported on 09/03/2023    [provider]     Critical care time: 40 minutes    Harlon Ditty, AGACNP-BC [facility] Prefer epic messenger for cross cover needs If after hours, please call E-link

## 2023-09-30 NOTE — Progress Notes (Signed)
PT Cancellation Note  Patient Details Name: Calvin [name] MRN: [id number] DOB: 09-27-1950   Cancelled Treatment:    Reason Eval/Treat Not Completed: Patient not medically ready;Patient's level of consciousness Pt re-intubated 1/25.  Spoke with MD about cancelling PT orders at this time, will need re-consult when appropriate.     Malachi Pro, DPT 09/30/2023, 11:04 AM

## 2023-09-30 NOTE — Progress Notes (Signed)
OT Cancellation Note  Patient Details Name: HAYDAN MANSOURI MRN: [id number] DOB: [birth year]   Cancelled Treatment:    Reason Eval/Treat Not Completed: Patient's level of consciousness;Patient not medically ready. Consult received, chart reviewed. Pt re-intubated and sedated. Will sign off at this time. Will require new therapy orders once pt is medically appropriate.   Arman Filter., MPH, MS, OTR/L ascom [phone] 09/30/23, 9:40 AM

## 2023-09-30 NOTE — Progress Notes (Signed)
CC: Fascial dehiscence Subjective: Unfortunately yesterday the patient had increased work of breathing and failed a trial of extubation so was reintubated yesterday afternoon.  This morning is intubated and sedated.  Per nursing staff no bilious drainage from the midline wound disruption and they have been dressing it with Xeroform and gauze.  Given that this is his second failed extubation he will need a tracheostomy and the ENT team has been consulted for this.  Objective: Vital signs in last 24 hours: Temp:  [97.8 F (36.6 C)-98.8 F (37.1 C)] 97.9 F (36.6 C) (01/26 1127) Pulse Rate:  [68-111] 76 (01/26 1132) Resp:  [15-28] 18 (01/26 1132) BP: (90-164)/(53-99) 113/66 (01/26 1132) SpO2:  [92 %-99 %] 95 % (01/26 1132) FiO2 (%):  [30 %-35 %] 35 % (01/26 0749) Weight:  [93.1 kg] 93.1 kg (01/26 0436) Last BM Date : 09/30/23  Intake/Output from previous day: 01/25 0701 - 01/26 0700 In: 1671 [I.V.:60.1; NG/GT:1310.8; IV Piggyback:300] Out: 810 [Urine:750; Stool:60] Intake/Output this shift: Total I/O In: 607.9 [I.V.:13.8; NG/GT:594.2] Out: 200 [Urine:50; Stool:150]  Physical exam:  Intubated and sedated, midline wound with staples at the superior and inferior portion of the wound.  Retention sutures are in place.  There is an area of dehiscence with pink mucosa appearing tissue right lower the skin.  Right now this is a controlled dehiscence of the wound.  Lab Results: CBC  Recent Labs    09/29/23 0401 09/30/23 0359  WBC 12.8* 8.5  HGB 8.4* 8.0*  HCT 27.4* 26.7*  PLT 365 326   BMET Recent Labs    09/29/23 0401 09/30/23 0351  NA 145 147*  K 4.7 5.1  CL 108 111  CO2 24 26  GLUCOSE 100* 114*  BUN 31* 37*  CREATININE 0.89 1.00  CALCIUM 8.4* 8.3*   PT/INR No results for input(s): "LABPROT", "INR" in the last 72 hours. ABG Recent Labs    09/29/23 1729  PHART 7.47*  HCO3 27.7    Studies/Results: DG Abd Portable 1V Result Date: 09/29/2023 CLINICAL DATA:   Orogastric tube placement. EXAM: PORTABLE ABDOMEN - 1 VIEW COMPARISON:  Earlier today. FINDINGS: Interval orogastric tube with its side hole in the proximal stomach and tip in the proximal to mid stomach. Normal bowel-gas pattern. Stable left hip prosthesis and abdominal and pelvic skin clips and retention sutures. IMPRESSION: Orogastric tube in good position. Electronically Signed   By: Beckie Salts M.D.   On: 09/29/2023 18:26   DG Chest Port 1 View Result Date: 09/29/2023 CLINICAL DATA:  Respiratory failure.  Orogastric tube placement EXAM: PORTABLE CHEST 1 VIEW COMPARISON:  09/23/2023 FINDINGS: Endotracheal tube tip at the orifice of the right mainstem bronchus. Is recommended that this be retracted 5.5 cm. Orogastric tube extending into the stomach with its side hole in the proximal stomach and tip not included. Right PICC tip in the superior vena cava approximately 4.5 cm superior to the superior cavoatrial junction. Normal sized heart. Dense left lower lobe airspace opacity with minimal improvement. Interval patchy and linear density in the more lateral aspects of the left lung base staple patchy and linear densities at the right lung base unremarkable bones. IMPRESSION: 1. Endotracheal tube tip at the orifice of the right mainstem bronchus. It is recommended that this be retracted 5.5 cm. 2. Minimal improvement in dense left lower lobe pneumonia or atelectasis. 3. Interval patchy and linear densities in the more lateral aspects of the left lung base, likely atelectasis or a combination of atelectasis and pneumonia.  4. Stable right basilar atelectasis. Electronically Signed   By: Beckie Salts M.D.   On: 09/29/2023 18:25   DG Abd 1 View Result Date: 09/29/2023 CLINICAL DATA:  Ileus. EXAM: ABDOMEN - 1 VIEW COMPARISON:  09/26/2023 FINDINGS: Short segment gas distended bowel the central abdomen measures about 4.7 cm diameter. Otherwise no gas dilated bowel loops evident. Contrast material is seen in a  nondilated left colon. Retention sutures overlie the midline. Bones are demineralized. Status post left hip replacement. IMPRESSION: Short segment gas distended bowel in the central abdomen measures about 4.7 cm diameter. Imaging features could be compatible with ileus. Electronically Signed   By: Kennith Center M.D.   On: 09/29/2023 08:53    Anti-infectives: Anti-infectives (From admission, onward)    Start     Dose/Rate Route Frequency Ordered Stop   09/23/23 1100  vancomycin (VANCOCIN) IVPB 1000 mg/200 mL premix  Status:  Discontinued        1,000 mg 200 mL/hr over 60 Minutes Intravenous Every 12 hours 09/23/23 0811 09/24/23 1021   09/22/23 2300  vancomycin (VANCOCIN) IVPB 1000 mg/200 mL premix  Status:  Discontinued        1,000 mg 200 mL/hr over 60 Minutes Intravenous Every 24 hours 09/22/23 0819 09/23/23 0811   09/22/23 1000  vancomycin (VANCOREADY) IVPB 1500 mg/300 mL        1,500 mg 150 mL/hr over 120 Minutes Intravenous  Once 09/22/23 0819 09/22/23 1916   09/21/23 0815  piperacillin-tazobactam (ZOSYN) IVPB 3.375 g        3.375 g 12.5 mL/hr over 240 Minutes Intravenous Every 8 hours 09/21/23 0729 09/27/23 2115   09/11/23 2000  cefoTEtan (CEFOTAN) 2 g in sodium chloride 0.9 % 100 mL IVPB        2 g 200 mL/hr over 30 Minutes Intravenous Every 8 hours 09/11/23 1801 09/12/23 1910   09/11/23 0600  cefoTEtan (CEFOTAN) 2 g in sodium chloride 0.9 % 100 mL IVPB        2 g 200 mL/hr over 30 Minutes Intravenous On call to O.R. 09/10/23 0858 09/12/23 1910   09/04/23 1200  Ampicillin-Sulbactam (UNASYN) 3 g in sodium chloride 0.9 % 100 mL IVPB  Status:  Discontinued        3 g 200 mL/hr over 30 Minutes Intravenous Every 6 hours 09/04/23 1052 09/08/23 1537   09/03/23 1800  ceFEPIme (MAXIPIME) 2 g in sodium chloride 0.9 % 100 mL IVPB  Status:  Discontinued        2 g 200 mL/hr over 30 Minutes Intravenous Every 12 hours 09/03/23 0812 09/04/23 0903   09/03/23 1500  metroNIDAZOLE (FLAGYL) IVPB 500  mg  Status:  Discontinued        500 mg 100 mL/hr over 60 Minutes Intravenous Every 12 hours 09/03/23 0758 09/04/23 0903   09/03/23 1000  oseltamivir (TAMIFLU) capsule 30 mg        30 mg Oral 2 times daily 09/03/23 0757 09/08/23 0959   09/03/23 0315  ceFEPIme (MAXIPIME) 2 g in sodium chloride 0.9 % 100 mL IVPB        2 g 200 mL/hr over 30 Minutes Intravenous  Once 09/03/23 0304 09/03/23 0441   09/03/23 0315  metroNIDAZOLE (FLAGYL) IVPB 500 mg        500 mg 100 mL/hr over 60 Minutes Intravenous  Once 09/03/23 0304 09/03/23 0441   09/03/23 0315  vancomycin (VANCOCIN) IVPB 1000 mg/200 mL premix        1,000 mg  200 mL/hr over 60 Minutes Intravenous  Once 09/03/23 0304 09/03/23 0441       Assessment/Plan:  Patient status post ex lap's with complications by complete wound dehiscence to return to the operating room for retention suture placement who now has an opening of the central part of the wound.  The superior and inferior parts of the wound are closed with staples.  The patient is having bowel function and there is no evidence of enterocutaneous fistula yet.  He also had a failure of extubation so will require tracheostomy.  At this time we will continue to treat this is a controlled dehiscence.  Please put Xeroform gauze over the pink mucosa and gauze over this.  I will discuss with Dr. Aleen Campi if any revision needs to be done and hopefully if it does this can be done at the time of tracheostomy.  Baker Pierini, M.D. [facility]

## 2023-09-30 NOTE — Progress Notes (Signed)
Pt's brother and multiple family members updated at bedside on plan of care.  All questions answered.  They are appreciative of update.     Harlon Ditty, AGACNP-BC [facility] Prefer epic messenger for cross cover needs If after hours, please call E-link

## 2023-09-30 NOTE — Progress Notes (Signed)
SLP Cancellation Note  Patient Details Name: Calvin [name] MRN: [id number] DOB: [birth year]   Cancelled treatment:       Reason Eval/Treat Not Completed: Medical issues which prohibited therapy  Pt continues to be intubated. Not appropriate at this time.  Pegi Milazzo B. Dreama Saa, M.S., CCC-SLP, CBIS Speech-Language Pathologist Certified Brain Injury Specialist [facility]  [facility] [phone] Ascom [phone] Fax [phone]  Sahithi Ordoyne Dreama Saa 09/30/2023, 9:22 AM

## 2023-09-30 NOTE — Consult Note (Signed)
Calvin [name], Calvin [name] [id number] 1951-08-18 Sandi Mealy, MD  Reason for Consult: trach request  Requesting Physician: Erin Fulling, MD Consulting Physician: Sandi Mealy, MD  HPI: This 73 y.o. year old male was admitted on 09/03/2023 for Influenza A [J10.1] Acute respiratory failure with hypoxia (HCC) [J96.01] Sepsis (HCC) [A41.9] Aspiration into airway, initial encounter [T17.908A] Sepsis, due to unspecified organism, unspecified whether acute organ dysfunction present (HCC) [A41.9]. 73 y.o. male admitted with PMHx most significant for colon cancer status post transverse colectomy and colostomy, presented with nausea, vomiting, and shortness of breath.  Was admitted with Sepsis due to Influenza A infection, Aspiration pneumonia, and Small Bowel Obstruction.  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  Course further complicated by Aspiration event requiring intubation and mechanical ventilation on 1/17.  Has been unable to tolerate extubation on 2 occasions due to inability to manage secretions and protect his airway.  CCU service requests consult for Tracheostomy placement.   Hospital events: 12/30: admitted to hospitalist sepsis, AKI, (+)Influenza A, likely aspiration pneumonia, acute hypoxic resp fail, SBO. NG placed, surgical consult.  12/31-01/06: relatively stable, remains SBO w/ NG and on TPN, worsening colostomy prolapse. Anemia w/o overt GIB, holding anti-plt/anti-cogs, GI no plans for endoscopic eval at this time 01/07: exlap, LOA, and colostomy revision  01/08: stable.  01/09: d/c NG 01/10: advancing diet as able. Hgb 6.8 - 1 unit PRBC given  01/11: persistent abd pain and cough, imaging concerning for abdominal surgical wound dehiscence deeper layers, going back to OR. Continue w/ cough suppression, await over-read but no obvious CT chest findings, suspect  post-viral bronchitis  01/12: to OR early AM for closure of wound dehiscence. Postop stable, NG in place. CT chest question pneumonia but pt reports coughing improved, suspect was mostly reflux, will continue to monitor cough. Postop care per general surgery include clamp NG and remain NPO for now   01/13: NG out, CLD 01/14: FLD, continue advancing diet tolerated  1/15: Advanced to dysphagia diet after ST eval.  Discontinue telemetry, palliative care consult, DC central line 1/16: Underwent CT abdomen/pelvis last evening.  Not a candidate for another surgery due to poor functional and nutritional status and multiple recent procedures.  Family deciding on goals of care/hospice 1/17: Aspirated this morning with development of severe respiratory distress and hypoxia requiring emergent intubation.  Now with developing septic shock.  Pt's daughter requesting transfer to [facility] or [facility].  Dr. Aleen Campi spoke both facilities which are at capacity and not accepting transfers. 1/18: Pt remains mechanically intubated overnight required increased dose of fentanyl and vecuronium x1 dose due to vent dyssynchrony.  Pt currently unable to follow commands RASS goal -4.  Will perform WUA and for possible SBT.  Pt requiring levophed gtt @5  mcg/min and vasopressin 0.03 units/min to maintain map 65 or higher.  Pt extubated to Bipap able to follow commands  1/19: Pt with increased work of breathing due to increased secretions despite scheduled hypertonic saline, Chest PT, and Bipap requiring reintubation  1/20: On minimal vent settings, will keep intubated today as required reintubation yesterday.  Off Vasopressors. KUB with improving ileus, General Surgery ok with starting trickle feeds 1/21: On minimal vent settings.  Daughter at bedside for SBT, FAILED SBT due to tachypnea and increased WOB. 1/22: Pt remains mechanically intubated on PRVC vent settings PEEP 5/FiO2 30% with increased work of breathing; vbg revealed metabolic  acidosis  requiring 2 amps of bicarb  1/23: Pt remains mechanically intubated on minimal settings.  Tolerating TF's.  Will continue to optimize respiratory status today and plan for SBT on 01/24 with daughter at bedside  1/24:  On minimal vents settings, daughter at bedside for WUA and SBT.  Following commands, EXTUBATED. 1/25: Difficultly managing secretions, required REINTUBATION.  Abdominal wound with worsening dehiscence, Surgery holding off on return to OR unless wound separates further given his frailty 1/26: No SBT today due to reintubation yesterday. Will consult ENT for Tracheostomy given 2 failed extubation attempts.  Off vasopressors. Medications:  Current Facility-Administered Medications  Medication Dose Route Frequency Provider Last Rate Last Admin   Chlorhexidine Gluconate Cloth 2 % PADS 6 each  6 each Topical Daily Assaker, Jean-Pierre, MD   6 each at 09/30/23 1000   docusate (COLACE) 50 MG/5ML liquid 100 mg  100 mg Per Tube BID PRN Erin Fulling, MD       docusate (COLACE) 50 MG/5ML liquid 100 mg  100 mg Per Tube BID Erin Fulling, MD   100 mg at 09/29/23 2133   enoxaparin (LOVENOX) injection 40 mg  40 mg Subcutaneous QHS Erin Fulling, MD   40 mg at 09/29/23 2134   feeding supplement (PROSource TF20) liquid 60 mL  60 mL Per Tube BID Donovan Kail, PA-C   60 mL at 09/30/23 0911   feeding supplement (VITAL 1.5 CAL) liquid 1,000 mL  1,000 mL Per Tube Continuous Donovan Kail, PA-C 50 mL/hr at 09/30/23 0841 Infusion Verify at 09/30/23 0841   fentaNYL (SUBLIMAZE) injection 25-50 mcg  25-50 mcg Intravenous Q2H PRN Jimmye Norman, NP   50 mcg at 09/29/23 1509   free water 100 mL  100 mL Per Tube Q4H Harlon Ditty D, NP   100 mL at 09/30/23 1120   glycopyrrolate (ROBINUL) injection 0.2 mg  0.2 mg Intravenous Q4H PRN Erin Fulling, MD   0.2 mg at 09/29/23 1559   HYDROmorphone (DILAUDID) 50 mg in 50 mL NS (1mg /mL) premix infusion  0.5-4 mg/hr Intravenous Continuous Erin Fulling, MD 3.5 mL/hr at 09/30/23 0841 3.5 mg/hr at 09/30/23 0841   HYDROmorphone (DILAUDID) bolus via infusion 0.25-1 mg  0.25-1 mg Intravenous Q30 min PRN Erin Fulling, MD   1 mg at 09/29/23 1749   HYDROmorphone (DILAUDID) injection 1 mg  1 mg Intravenous Q2H PRN Erin Fulling, MD   1 mg at 09/29/23 1416   insulin aspart (novoLOG) injection 0-15 Units  0-15 Units Subcutaneous Q4H Ezequiel Essex, NP   3 Units at 09/28/23 0734   ipratropium-albuterol (DUONEB) 0.5-2.5 (3) MG/3ML nebulizer solution 3 mL  3 mL Nebulization Q6H PRN Ezequiel Essex, NP       ipratropium-albuterol (DUONEB) 0.5-2.5 (3) MG/3ML nebulizer solution 3 mL  3 mL Inhalation TID Erin Fulling, MD   3 mL at 09/30/23 0744   lamoTRIgine (LAMICTAL) tablet 50 mg  50 mg Per Tube BID Assaker, [place], MD   50 mg at 09/30/23 0909   levETIRAcetam (KEPPRA) IVPB 1000 mg/100 mL premix  1,000 mg Intravenous Q12H Mila Merry A, RPH 400 mL/hr at 09/30/23 0913 1,000 mg at 09/30/23 0913   methylPREDNISolone sodium succinate (SOLU-MEDROL) 40 mg/mL injection 20 mg  20 mg Intravenous Q12H Erin Fulling, MD   20 mg at 09/30/23 1118   midazolam (VERSED) 100 mg/100 mL (1 mg/mL) premix infusion  0.5-10 mg/hr Intravenous Continuous Erin Fulling, MD 2 mL/hr at 09/30/23 0841 2 mg/hr at 09/30/23 [phone]  norepinephrine (LEVOPHED) 16 mg in (0.064 mg/mL) premix infusion  0-40 mcg/min Intravenous Titrated Judithe Modest, NP   Stopped at 09/26/23 0121   Oral care mouth rinse  15 mL Mouth Rinse Q2H Erin Fulling, MD   15 mL at 09/30/23 1000   Oral care mouth rinse  15 mL Mouth Rinse PRN Erin Fulling, MD       oxyCODONE (Oxy IR/ROXICODONE) immediate release tablet 5 mg  5 mg Per Tube Q6H Erin Fulling, MD   5 mg at 09/30/23 1118   pantoprazole (PROTONIX) injection 40 mg  40 mg Intravenous Q12H Mila Merry A, RPH   40 mg at 09/30/23 1610   polyethylene glycol (MIRALAX / GLYCOLAX) packet 17 g  17 g Per Tube Daily Erin Fulling, MD       pregabalin (LYRICA)  capsule 100 mg  100 mg Per Tube Daily Assaker, [place], MD   100 mg at 09/30/23 0908   pregabalin (LYRICA) capsule 200 mg  200 mg Per Tube QHS Assaker, [place], MD   200 mg at 09/29/23 2133   senna (SENOKOT) tablet 17.2 mg  2 tablet Per Tube QHS PRN Assaker, [place], MD       sodium chloride flush (NS) 0.9 % injection 10-40 mL  10-40 mL Intracatheter Q12H Sakai, Isami, DO   10 mL at 09/30/23 0911   sodium chloride flush (NS) 0.9 % injection 10-40 mL  10-40 mL Intracatheter PRN Sakai, Isami, DO       sodium chloride flush (NS) 0.9 % injection 3-10 mL  3-10 mL Intravenous Q12H Sherryll Burger, Vipul, MD   10 mL at 09/30/23 0910   sodium chloride flush (NS) 0.9 % injection 3-10 mL  3-10 mL Intravenous PRN Delfino Lovett, MD      .  Medications Prior to Admission  Medication Sig Dispense Refill   aspirin 81 MG chewable tablet Take 81 mg by mouth daily.     baclofen (LIORESAL) 5 mg TABS tablet Take 5 mg by mouth 2 (two) times daily.     diazePAM, 20 MG Dose, (VALTOCO 20 MG DOSE) 2 x 10 MG/0.1ML LQPK Place 20 mg into the nose as needed (seizure lasting over 2 minutes). 10 each 2   [EXPIRED] guaiFENesin (MUCINEX) 600 MG 12 hr tablet Take 600 mg by mouth every 12 (twelve) hours.     ipratropium-albuterol (DUONEB) 0.5-2.5 (3) MG/3ML SOLN Inhale 3 mLs into the lungs every 6 (six) hours as needed.     lactulose (CHRONULAC) 10 GM/15ML solution Take 20 g by mouth daily.     lamoTRIgine (LAMICTAL) 25 MG tablet Take 50 mg by mouth 2 (two) times daily.     levETIRAcetam (KEPPRA) 1000 MG tablet Take 1 tablet (1,000 mg total) by mouth 2 (two) times daily.     Melatonin 10 MG TABS Take 10 mg by mouth at bedtime.     nitroGLYCERIN (NITROSTAT) 0.4 MG SL tablet Place 0.4 mg under the tongue 3 (three) times daily as needed for chest pain. Every 5 minutes up to 3 doses     pantoprazole (PROTONIX) 40 MG tablet Take 1 tablet (40 mg total) by mouth daily.     polyethylene glycol (MIRALAX / GLYCOLAX) 17 g packet Take 17 g by  mouth 2 (two) times daily.     pregabalin (LYRICA) 100 MG capsule Take 200 mg by mouth at bedtime.     senna (SENOKOT) 8.6 MG TABS tablet Take 2 tablets (17.2 mg total) by mouth at bedtime as  needed for mild constipation or moderate constipation.     tamsulosin (FLOMAX) 0.4 MG CAPS capsule Take 0.4 mg by mouth every evening.     divalproex (DEPAKOTE) 125 MG DR tablet Take 4 tablets (500 mg total) by mouth 2 (two) times daily for 1 day, THEN 4 tablets (500 mg total) daily for 3 days, THEN 2 tablets (250 mg total) daily for 3 days, THEN 1 tablet (125 mg total) daily for 3 days. (Patient not taking: Reported on 09/03/2023)     fluticasone (FLONASE) 50 MCG/ACT nasal spray Place 2 sprays into both nostrils daily. (Patient not taking: Reported on 09/03/2023)     Mouthwashes (MOUTH RINSE) LIQD solution 15 mLs by Mouth Rinse route every 2 (two) hours. (Patient not taking: Reported on 09/03/2023)     pregabalin (LYRICA) 50 MG capsule Take 50 mg capsule twice daily and 100 mg (two capsules) at bedtime (Patient not taking: Reported on 09/03/2023) 5 capsule 0   tiotropium (SPIRIVA) 18 MCG inhalation capsule Place 18 mcg into inhaler and inhale daily. (Patient not taking: Reported on 09/03/2023)      Allergies: No Known Allergies  PMH:  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 side (HCC)    Hypertension    Myocardial infarction (HCC)    Neuromuscular disorder (HCC)    Ogilvie syndrome    Seizures (HCC)    last 6 months ago   Stroke [facility])    multiple  left side weakness   Tremors of nervous system    Unstable angina (HCC)     Fam Hx:  Family History  Problem Relation Age of Onset   Cancer Mother     Hypertension Father    Leukemia Brother     Soc Hx:  Social History   Socioeconomic History   Marital status: Single    Spouse name: Not on file   Number of children: Not on file   Years of education: Not on file   Highest education level: Not on file  Occupational History   Not on file  Tobacco Use   Smoking status: Former    Types: Cigarettes   Smokeless tobacco: Never  Substance and Sexual Activity   Alcohol use: Not Currently    Comment: weekends   Drug use: No   Sexual activity: Not on file  Other Topics Concern   Not on file  Social History Narrative   Not on file   Social Drivers of Health   Financial Resource Strain: Not on file  Food Insecurity: No Food Insecurity (09/11/2023)   Hunger Vital Sign    Worried About Running Out of Food in the Last Year: Never true    Ran Out of Food in the Last Year: Never true  Transportation Needs: No Transportation Needs (09/11/2023)   PRAPARE - Administrator, Civil Service (Medical): No    Lack of Transportation (Non-Medical): No  Physical Activity: Not on file  Stress: Not on file  Social Connections: Unknown (09/11/2023)   Social Connection and Isolation Panel [NHANES]    Frequency of Communication with Friends and Family: Three times a week  Frequency of Social Gatherings with Friends and Family: Patient declined    Attends Religious Services: 1 to 4 times per year    Active Member of [employer] or Organizations: No    Attends Banker Meetings: Never    Marital Status: Not on file  Intimate Partner Violence: Not At Risk (09/11/2023)   Humiliation, Afraid, Rape, and Kick questionnaire    Fear of Current or Ex-Partner: No    Emotionally Abused: No    Physically Abused: No    Sexually Abused: No    PSH:  Past Surgical History:  Procedure Laterality Date   CARPAL TUNNEL RELEASE Left 10/19/2015   Procedure: CARPAL TUNNEL RELEASE;  Surgeon: Kennedy Bucker, MD;  Location: [facility];  Service: Orthopedics;   Laterality: Left;   COLONOSCOPY WITH PROPOFOL N/A 10/26/2021   Procedure: COLONOSCOPY WITH PROPOFOL;  Surgeon: Regis Bill, MD;  Location: [facility];  Service: Endoscopy;  Laterality: N/A;   COLONOSCOPY WITH PROPOFOL N/A 06/02/2022   Procedure: COLONOSCOPY WITH PROPOFOL;  Surgeon: Regis Bill, MD;  Location: [facility];  Service: Endoscopy;  Laterality: N/A;   COLONOSCOPY WITH PROPOFOL N/A 06/06/2022   Procedure: COLONOSCOPY WITH PROPOFOL;  Surgeon: Regis Bill, MD;  Location: [facility];  Service: Endoscopy;  Laterality: N/A;   COLOSTOMY REVISION N/A 09/11/2023   Procedure: COLOSTOMY REVISION;  Surgeon: Henrene Dodge, MD;  Location: [facility];  Service: General;  Laterality: N/A;   JOINT REPLACEMENT     left partial hip    LAPAROSCOPIC SIGMOID COLECTOMY  09/12/2010   Lap hand assisted sigmoidectomy, mobilization splenic flexure -- Dr. Freida Busman   LAPAROTOMY N/A 09/11/2023   Procedure: EXPLORATORY LAPAROTOMY;  Surgeon: Henrene Dodge, MD;  Location: [facility];  Service: General;  Laterality: N/A;   LAPAROTOMY N/A 09/15/2023   Procedure: EXPLORATORY LAPAROTOMY;  Surgeon: Sung Amabile, DO;  Location: [facility];  Service: General;  Laterality: N/A;   LYSIS OF ADHESION N/A 09/11/2023   Procedure: LYSIS OF ADHESION;  Surgeon: Henrene Dodge, MD;  Location: [facility];  Service: General;  Laterality: N/A;   THROAT SURGERY  09/05/2011   cancer  . Procedures since admission: No admission procedures for hospital encounter.  ROS: Review of systems normal other than 12 systems except per HPI.  PHYSICAL EXAM  Vitals: Blood pressure (!) 97/57, pulse 79, temperature 97.8 F (36.6 C), temperature source Axillary, resp. rate 18, height 5' 10.98" (1.803 m), weight 93.1 kg, SpO2 95%.. General: Well-developed, Well-nourished on vent and sedated Mood: Unable to assess. Patient on vent and sedated Orientation: Patient on vent and sedated. Vocal Quality: Unable to assess. Patient  intubated, on vent and sedated Head and Face: NCAT. No facial asymmetry. No visible skin lesions. No significant facial scars. No tenderness with sinus percussion. Facial strength normal and symmetric. Ears: External ears with normal landmarks, no lesions. External auditory canals free of infection. Cerumen impaction limits visualization of tympanic membranes  Hearing: Unable to assess. Patient on vent and sedated Nose: External nose normal with midline dorsum and no lesions or deformity. Nasal Cavity reveals essentially midline septum with normal inferior turbinates. No significant mucosal congestion or erythema. Nasal secretions are minimal and clear. No polyps seen on anterior rhinoscopy. Oral Cavity/ Oropharynx: Lips are normal with no lesions. Edentulous, gingiva appear normal. Visualized tongue, floor of mouth and pharynx appear normal, though exam is limited due to presence of endotracheal tube. Indirect Laryngoscopy/Nasopharyngoscopy: Visualization of the larynx, hypopharynx and nasopharynx is not possible in this setting with routine examination.  Neck: Supple and symmetric with no palpable masses, tenderness or crepitance. Right neck scar present. The trachea is low in the neck due to kyphosis. Thyroid region is without palpable masses or enlargement, though exam limited. Parotid and submandibular glands are soft, nontender and symmetric, without masses. Lymphatic: Cervical lymph nodes are without palpable lymphadenopathy or tenderness. Respiratory: Patient on ventilator for respiratory support Cardiovascular: Carotid pulse shows regular rate and rhythm Neurologic: Unable to assess. Patient on vent and sedated Eyes: Unable to assess. Patient on vent and sedated  MEDICAL DECISION MAKING: Data Review:  Results for orders placed or performed during the hospital encounter of 09/03/23 (from the past 48 hours)  Glucose, capillary     Status: None   Collection Time: 09/28/23 11:35 AM  Result  Value Ref Range   Glucose-Capillary 98 70 - 99 mg/dL    Comment: Glucose reference range applies only to samples taken after fasting for at least 8 hours.  Glucose, capillary     Status: None   Collection Time: 09/28/23  3:52 PM  Result Value Ref Range   Glucose-Capillary 91 70 - 99 mg/dL    Comment: Glucose reference range applies only to samples taken after fasting for at least 8 hours.  Glucose, capillary     Status: None   Collection Time: 09/28/23  7:47 PM  Result Value Ref Range   Glucose-Capillary 94 70 - 99 mg/dL    Comment: Glucose reference range applies only to samples taken after fasting for at least 8 hours.  Glucose, capillary     Status: None   Collection Time: 09/28/23 11:43 PM  Result Value Ref Range   Glucose-Capillary 75 70 - 99 mg/dL    Comment: Glucose reference range applies only to samples taken after fasting for at least 8 hours.  Glucose, capillary     Status: None   Collection Time: 09/29/23  3:26 AM  Result Value Ref Range   Glucose-Capillary 82 70 - 99 mg/dL    Comment: Glucose reference range applies only to samples taken after fasting for at least 8 hours.  CBC     Status: Abnormal   Collection Time: 09/29/23  4:01 AM  Result Value Ref Range   WBC 12.8 (H) 4.0 - 10.5 K/uL   RBC 2.92 (L) 4.22 - 5.81 MIL/uL   Hemoglobin 8.4 (L) 13.0 - 17.0 g/dL   HCT 29.5 (L) 28.4 - 13.2 %   MCV 93.8 80.0 - 100.0 fL   MCH 28.8 26.0 - 34.0 pg   MCHC 30.7 30.0 - 36.0 g/dL   RDW 44.0 (H) 10.2 - 72.5 %   Platelets 365 150 - 400 K/uL   nRBC 0.9 (H) 0.0 - 0.2 %    Comment: Performed at [facility], [address] [place], [place] [address]  Renal function panel     Status: Abnormal   Collection Time: 09/29/23  4:01 AM  Result Value Ref Range   Sodium 145 135 - 145 mmol/L   Potassium 4.7 3.5 - 5.1 mmol/L   Chloride 108 98 - 111 mmol/L   CO2 24 22 - 32 mmol/L   Glucose, Bld 100 (H) 70 - 99 mg/dL    Comment: Glucose reference range applies only to samples  taken after fasting for at least 8 hours.   BUN 31 (H) 8 - 23 mg/dL   Creatinine, Ser 0.34 0.61 - 1.24 mg/dL   Calcium 8.4 (L) 8.9 - 10.3 mg/dL   Phosphorus 4.7 (H) 2.5 -  4.6 mg/dL   Albumin 2.4 (L) 3.5 - 5.0 g/dL   GFR, Estimated >40 >98 mL/min    Comment: (NOTE) Calculated using the CKD-EPI Creatinine Equation (2021)    Anion gap 13 5 - 15    Comment: Performed at [facility], [address] [place], [place] [address]  Glucose, capillary     Status: None   Collection Time: 09/29/23  8:00 AM  Result Value Ref Range   Glucose-Capillary 79 70 - 99 mg/dL    Comment: Glucose reference range applies only to samples taken after fasting for at least 8 hours.  Glucose, capillary     Status: None   Collection Time: 09/29/23 12:15 PM  Result Value Ref Range   Glucose-Capillary 75 70 - 99 mg/dL    Comment: Glucose reference range applies only to samples taken after fasting for at least 8 hours.  Glucose, capillary     Status: None   Collection Time: 09/29/23  3:21 PM  Result Value Ref Range   Glucose-Capillary 99 70 - 99 mg/dL    Comment: Glucose reference range applies only to samples taken after fasting for at least 8 hours.  Blood gas, arterial     Status: Abnormal   Collection Time: 09/29/23  5:29 PM  Result Value Ref Range   FIO2 30 %   Mode PRESSURE REGULATED VOLUME CONTROL    MECHVT 500 mL   PEEP 5 cm H20   pH, Arterial 7.47 (H) 7.35 - 7.45   pCO2 arterial 38 32 - 48 mmHg   pO2, Arterial 62 (L) 83 - 108 mmHg   Bicarbonate 27.7 20.0 - 28.0 mmol/L   Acid-Base Excess 3.9 (H) 0.0 - 2.0 mmol/L   O2 Saturation 92.6 %   Patient temperature 37.0    Collection site RIGHT RADIAL    Allens test (pass/fail) PASS PASS   Mechanical Rate 18     Comment: Performed at [facility], [address] [place], [place] [address]  Glucose, capillary     Status: Abnormal   Collection Time: 09/29/23  7:49 PM  Result Value Ref Range   Glucose-Capillary 105 (H) 70 - 99 mg/dL     Comment: Glucose reference range applies only to samples taken after fasting for at least 8 hours.  Glucose, capillary     Status: None   Collection Time: 09/29/23 11:49 PM  Result Value Ref Range   Glucose-Capillary 98 70 - 99 mg/dL    Comment: Glucose reference range applies only to samples taken after fasting for at least 8 hours.  Glucose, capillary     Status: Abnormal   Collection Time: 09/30/23  3:42 AM  Result Value Ref Range   Glucose-Capillary 100 (H) 70 - 99 mg/dL    Comment: Glucose reference range applies only to samples taken after fasting for at least 8 hours.  Renal function panel     Status: Abnormal   Collection Time: 09/30/23  3:51 AM  Result Value Ref Range   Sodium 147 (H) 135 - 145 mmol/L   Potassium 5.1 3.5 - 5.1 mmol/L   Chloride 111 98 - 111 mmol/L   CO2 26 22 - 32 mmol/L   Glucose, Bld 114 (H) 70 - 99 mg/dL    Comment: Glucose reference range applies only to samples taken after fasting for at least 8 hours.   BUN 37 (H) 8 - 23 mg/dL   Creatinine, Ser 6.21 0.61 - 1.24 mg/dL   Calcium 8.3 (L) 8.9 -  10.3 mg/dL   Phosphorus 5.4 (H) 2.5 - 4.6 mg/dL   Albumin 2.4 (L) 3.5 - 5.0 g/dL   GFR, Estimated >40 >34 mL/min    Comment: (NOTE) Calculated using the CKD-EPI Creatinine Equation (2021)    Anion gap 10 5 - 15    Comment: Performed at [facility], [address] [place], [place] [address]  Triglycerides     Status: Abnormal   Collection Time: 09/30/23  3:51 AM  Result Value Ref Range   Triglycerides 161 (H) <150 mg/dL    Comment: Performed at [facility], [address] [place], [place] [address]  CBC     Status: Abnormal   Collection Time: 09/30/23  3:59 AM  Result Value Ref Range   WBC 8.5 4.0 - 10.5 K/uL   RBC 2.82 (L) 4.22 - 5.81 MIL/uL   Hemoglobin 8.0 (L) 13.0 - 17.0 g/dL   HCT 56.4 (L) 33.2 - 95.1 %   MCV 94.7 80.0 - 100.0 fL   MCH 28.4 26.0 - 34.0 pg   MCHC 30.0 30.0 - 36.0 g/dL   RDW 88.4 (H) 16.6 - 06.3 %    Platelets 326 150 - 400 K/uL   nRBC 0.7 (H) 0.0 - 0.2 %    Comment: Performed at [facility], [address] [place], [place] [address]  Glucose, capillary     Status: None   Collection Time: 09/30/23  7:46 AM  Result Value Ref Range   Glucose-Capillary 93 70 - 99 mg/dL    Comment: Glucose reference range applies only to samples taken after fasting for at least 8 hours.  Varney Biles Abd Portable 1V Result Date: 09/29/2023 CLINICAL DATA:  Orogastric tube placement. EXAM: PORTABLE ABDOMEN - 1 VIEW COMPARISON:  Earlier today. FINDINGS: Interval orogastric tube with its side hole in the proximal stomach and tip in the proximal to mid stomach. Normal bowel-gas pattern. Stable left hip prosthesis and abdominal and pelvic skin clips and retention sutures. IMPRESSION: Orogastric tube in good position. Electronically Signed   By: Beckie Salts M.D.   On: 09/29/2023 18:26   DG Chest Port 1 View Result Date: 09/29/2023 CLINICAL DATA:  Respiratory failure.  Orogastric tube placement EXAM: PORTABLE CHEST 1 VIEW COMPARISON:  09/23/2023 FINDINGS: Endotracheal tube tip at the orifice of the right mainstem bronchus. Is recommended that this be retracted 5.5 cm. Orogastric tube extending into the stomach with its side hole in the proximal stomach and tip not included. Right PICC tip in the superior vena cava approximately 4.5 cm superior to the superior cavoatrial junction. Normal sized heart. Dense left lower lobe airspace opacity with minimal improvement. Interval patchy and linear density in the more lateral aspects of the left lung base staple patchy and linear densities at the right lung base unremarkable bones. IMPRESSION: 1. Endotracheal tube tip at the orifice of the right mainstem bronchus. It is recommended that this be retracted 5.5 cm. 2. Minimal improvement in dense left lower lobe pneumonia or atelectasis. 3. Interval patchy and linear densities in the more lateral aspects of the left lung base, likely  atelectasis or a combination of atelectasis and pneumonia. 4. Stable right basilar atelectasis. Electronically Signed   By: Beckie Salts M.D.   On: 09/29/2023 18:25   DG Abd 1 View Result Date: 09/29/2023 CLINICAL DATA:  Ileus. EXAM: ABDOMEN - 1 VIEW COMPARISON:  09/26/2023 FINDINGS: Short segment gas distended bowel the central abdomen measures about 4.7 cm diameter. Otherwise no gas dilated bowel  loops evident. Contrast material is seen in a nondilated left colon. Retention sutures overlie the midline. Bones are demineralized. Status post left hip replacement. IMPRESSION: Short segment gas distended bowel in the central abdomen measures about 4.7 cm diameter. Imaging features could be compatible with ileus. Electronically Signed   By: Kennith Center M.D.   On: 09/29/2023 08:53  .   ASSESSMENT: Respiratory failure, failed extubations.  PLAN: Critical care consult involving over 31 minutes of consultation, chart review, examination, discussion with family, and care coordination. Will schedule tracheostomy. Risks including bleeding, infection, scarring, recurrent laryngeal nerve injury to be discussed with patient's daughter, and consent will be obtained. Request patient be switched to heparin for DVT prophylaxis immediately to allow for better surgical hemostasis.    Sandi Mealy, MD 09/30/2023 11:23 AM

## 2023-09-30 NOTE — Plan of Care (Signed)
  Problem: Clinical Measurements: Goal: Ability to maintain clinical measurements within normal limits will improve Outcome: Progressing Goal: Will remain free from infection Outcome: Progressing Goal: Diagnostic test results will improve Outcome: Progressing Goal: Respiratory complications will improve Outcome: Not Progressing Goal: Cardiovascular complication will be avoided Outcome: Progressing   Problem: Pain Management: Goal: General experience of comfort will improve Outcome: Progressing   Problem: Safety: Goal: Ability to remain free from injury will improve Outcome: Progressing   Problem: Skin Integrity: Goal: Risk for impaired skin integrity will decrease Outcome: Progressing   Problem: Respiratory: Goal: Ability to maintain a clear airway will improve Outcome: Not Progressing

## 2023-10-01 ENCOUNTER — Inpatient Hospital Stay: Payer: Medicare Other

## 2023-10-01 DIAGNOSIS — T81321A Disruption or dehiscence of closure of internal operation (surgical) wound of abdominal wall muscle or fascia, initial encounter: Secondary | ICD-10-CM | POA: Diagnosis not present

## 2023-10-01 LAB — MAGNESIUM: Magnesium: 2.4 mg/dL (ref 1.7–2.4)

## 2023-10-01 LAB — POTASSIUM
Potassium: 4.6 mmol/L (ref 3.5–5.1)
Potassium: 4.6 mmol/L (ref 3.5–5.1)

## 2023-10-01 LAB — RENAL FUNCTION PANEL
Albumin: 2.2 g/dL — ABNORMAL LOW (ref 3.5–5.0)
Anion gap: 9 (ref 5–15)
BUN: 55 mg/dL — ABNORMAL HIGH (ref 8–23)
CO2: 24 mmol/L (ref 22–32)
Calcium: 8.1 mg/dL — ABNORMAL LOW (ref 8.9–10.3)
Chloride: 113 mmol/L — ABNORMAL HIGH (ref 98–111)
Creatinine, Ser: 1.26 mg/dL — ABNORMAL HIGH (ref 0.61–1.24)
GFR, Estimated: 60 mL/min — ABNORMAL LOW (ref >60)
Glucose, Bld: 147 mg/dL — ABNORMAL HIGH (ref 70–99)
Phosphorus: 5.8 mg/dL — ABNORMAL HIGH (ref 2.5–4.6)
Potassium: 5.2 mmol/L — ABNORMAL HIGH (ref 3.5–5.1)
Sodium: 146 mmol/L — ABNORMAL HIGH (ref 135–145)

## 2023-10-01 LAB — CBC
HCT: 27.1 % — ABNORMAL LOW (ref 39.0–52.0)
Hemoglobin: 7.7 g/dL — ABNORMAL LOW (ref 13.0–17.0)
MCH: 27.7 pg (ref 26.0–34.0)
MCHC: 28.4 g/dL — ABNORMAL LOW (ref 30.0–36.0)
MCV: 97.5 fL (ref 80.0–100.0)
Platelets: 281 10*3/uL (ref 150–400)
RBC: 2.78 MIL/uL — ABNORMAL LOW (ref 4.22–5.81)
RDW: 16.7 % — ABNORMAL HIGH (ref 11.5–15.5)
WBC: 12.3 10*3/uL — ABNORMAL HIGH (ref 4.0–10.5)
nRBC: 0.2 % (ref 0.0–0.2)

## 2023-10-01 LAB — GLUCOSE, CAPILLARY
Glucose-Capillary: 144 mg/dL — ABNORMAL HIGH (ref 70–99)
Glucose-Capillary: 116 mg/dL — ABNORMAL HIGH (ref 70–99)
Glucose-Capillary: 92 mg/dL (ref 70–99)
Glucose-Capillary: 134 mg/dL — ABNORMAL HIGH (ref 70–99)
Glucose-Capillary: 108 mg/dL — ABNORMAL HIGH (ref 70–99)
Glucose-Capillary: 127 mg/dL — ABNORMAL HIGH (ref 70–99)

## 2023-10-01 MED ORDER — NOREPINEPHRINE 4 MG/250ML-% IV SOLN
0.0000 ug/min | INTRAVENOUS | Status: DC
  Administered 2023-10-01: 2 ug/min via INTRAVENOUS
  Filled 2023-10-01: qty 250

## 2023-10-01 MED ORDER — INSULIN ASPART 100 UNIT/ML IV SOLN
10.0000 [IU] | Freq: Once | INTRAVENOUS | Status: AC
  Administered 2023-10-01: 10 [IU] via INTRAVENOUS
  Filled 2023-10-01: qty 0.1

## 2023-10-01 MED ORDER — MILRINONE LACTATE IN DEXTROSE 20-5 MG/100ML-% IV SOLN
0.2500 ug/kg/min | INTRAVENOUS | Status: DC
  Administered 2023-10-01 – 2023-10-02 (×2): 0.25 ug/kg/min via INTRAVENOUS
  Filled 2023-10-01 (×3): qty 100

## 2023-10-01 MED ORDER — FREE WATER
150.0000 mL | Status: DC
Start: 2023-10-01 — End: 2023-10-03
  Administered 2023-10-01 – 2023-10-03 (×9): 150 mL

## 2023-10-01 MED ORDER — JUVEN PO PACK
1.0000 | PACK | Freq: Two times a day (BID) | ORAL | Status: DC
Start: 2023-10-02 — End: 2023-10-06
  Administered 2023-10-02 – 2023-10-05 (×7): 1

## 2023-10-01 MED ORDER — FUROSEMIDE 10 MG/ML IJ SOLN
20.0000 mg | Freq: Every day | INTRAMUSCULAR | Status: DC
  Administered 2023-10-01 – 2023-10-05 (×5): 20 mg via INTRAVENOUS
  Filled 2023-10-01 (×6): qty 2

## 2023-10-01 MED ORDER — PIVOT 1.5 CAL PO LIQD
1000.0000 mL | ORAL | Status: DC
  Administered 2023-10-01 – 2023-10-04 (×4): 1000 mL
  Filled 2023-10-01: qty 1000

## 2023-10-01 MED ORDER — DEXTROSE 50 % IV SOLN
12.5000 g | Freq: Once | INTRAVENOUS | Status: AC
  Administered 2023-10-01: 12.5 g via INTRAVENOUS
  Filled 2023-10-01: qty 50

## 2023-10-01 MED ORDER — POLYVINYL ALCOHOL 1.4 % OP SOLN
1.0000 [drp] | OPHTHALMIC | Status: DC | PRN
  Filled 2023-10-01: qty 15

## 2023-10-01 NOTE — Progress Notes (Signed)
Nutrition Follow-up  DOCUMENTATION CODES:   Not applicable  INTERVENTION:   Change to Pivot 1.5@60ml /hr continuous  Free water flushes q4 hours   Regimen provides 2160kcal/day, 135g/day protein and 1951ml/day of free water.   Juven Fruit Punch BID via tube, each serving provides 95kcal and 2.5g of protein (amino acids glutamine and arginine)  Daily weights   NUTRITION DIAGNOSIS:   Inadequate oral intake related to altered GI function as evidenced by NPO status. -ongoing   GOAL:   Patient will meet greater than or equal to 90% of their needs -met with tube feeds   MONITOR:   Vent status, Labs, Weight trends, Skin, I & O's  ASSESSMENT:   73 y.o. 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 Pt developed respiratory failure 1/17 requiring sedation and intubation.  Pt remains sedated and ventilated. Pt unable to be weaned from the ventilator after multiple attempts. Plan is for tracheostomy tomorrow pending GOC. Pt tolerating tube feeds via OGT. Per surgery, pt is unable to have surgically placed G-tube but may be able to have PEG tube with GI or IR G-tube. Per chart, pt appears weight stable since admission. Pt with mild hypernatremia; will increase free water.   Medications reviewed and include:  colace, heparin, insulin, solu-medrol, oxycodone, protonix, miralax, hydromorphone  Labs reviewed: Na 146(H), K 5.2(H), BUN 55(H), creat 1.26(H), P 5.8(H), Mg 2.4 wnl Wbc- 12.3(H), Hgb 7.7(L), Hct 27.1(L) Cbgs- 92, 116, 144 x 24 hrs   Patient is currently intubated on ventilator support MV: 8.0 L/min Temp (24hrs), Avg:98.1 F (36.7 C), Min:97.6 F (36.4 C), Max:98.7 F (37.1 C)  MAP- >19mmHg   UOP- 1810ml/hr   NUTRITION - FOCUSED PHYSICAL EXAM:  Flowsheet Row Most Recent Value  Orbital Region Mild depletion  Upper Arm Region No depletion  Thoracic and Lumbar Region No depletion  Buccal Region Mild depletion  Temple Region Moderate depletion  Clavicle Bone Region No depletion  Clavicle and Acromion Bone Region No depletion  Scapular Bone Region No depletion  Dorsal Hand No depletion  Patellar Region Moderate depletion  Anterior Thigh Region Mild depletion  Posterior Calf Region Mild depletion  Edema (RD Assessment) Mild  Hair Reviewed  Eyes Reviewed  Mouth Reviewed  Skin Reviewed  Nails Reviewed   Diet Order:   Diet Order             Diet NPO time specified  Diet effective midnight           Diet NPO time specified  Diet effective now                  EDUCATION NEEDS:   Not appropriate for education at this time  Skin:  Skin Assessment: Reviewed RN Assessment (abdominal wound, partial deshiscence exposing small bowel, pressure injuries L and R heels)  Last BM:  1/27- via ostomy  Height:   Ht Readings from Last 1 Encounters:  09/23/23 5' 10.98" (  1.803 m)    Weight:   Wt Readings from Last 1 Encounters:  10/01/23 93.4 kg    Ideal Body Weight:  78.1 kg  BMI:  Body mass index is 28.73 kg/m.  Estimated Nutritional Needs:   Kcal:  2200-2500kcal/day  Protein:  110-125 grams  Fluid:  2.0-2.3L/day  Betsey Holiday MS, RD, LDN If unable to be reached, please send secure chat to "RD inpatient" available from 8:00a-4:00p daily

## 2023-10-01 NOTE — Progress Notes (Signed)
10/01/2023  Subjective: Patient is 16 Days Post-Op s/p 2nd exploratory laparotomy for fascial dehiscence, 20 days post-op from initial exploratory laparotomy, LOA, and colostomy revision.  No acute events.  Remains on vent, sedated, but remains off pressors.  Having good ostomy output.  Pending tracheostomy with ENT.  Vital signs: Temp:  [97.6 F (36.4 C)-98.7 F (37.1 C)] 98.3 F (36.8 C) (01/27 0400) Pulse Rate:  [72-81] 73 (01/27 0600) Resp:  [18-22] 18 (01/27 0600) BP: (82-113)/(52-66) 101/56 (01/27 0600) SpO2:  [95 %-99 %] 98 % (01/27 0600) FiO2 (%):  [35 %] 35 % (01/27 0810) Weight:  [93.4 kg] 93.4 kg (01/27 0439)   Intake/Output: 01/26 0701 - 01/27 0700 In: 1836.6 [I.V.:108.9; NG/GT:1527.7; IV Piggyback:200] Out: 2000 [Urine:1800; Stool:200] Last BM Date : 09/30/23  Physical Exam: Constitutional:  Sedated, intubated Abdomen:  soft, does not appear distended or tender.  Midline incision with staples and retention sutures.  There is a 3 cm separation of the skin edges, exposing likely small bowel.  There is now a thin fibrinous layer forming over the bowel.  No bilious staining.  Reapplied xeroform, gauze, and ABD pad dressing.      Labs:  Recent Labs    09/30/23 0359 10/01/23 0431  WBC 8.5 12.3*  HGB 8.0* 7.7*  HCT 26.7* 27.1*  PLT 326 281   Recent Labs    09/30/23 0351 10/01/23 0431  NA 147* 146*  K 5.1 5.2*  CL 111 113*  CO2 26 24  GLUCOSE 114* 147*  BUN 37* 55*  CREATININE 1.00 1.26*  CALCIUM 8.3* 8.1*   No results for input(s): "LABPROT", "INR" in the last 72 hours.  Imaging: DG Chest Port 1 View Result Date: 10/01/2023 CLINICAL DATA:  [id number].  Ileus. [id number].  Acute respiratory failure with hypoxia. EXAM: PORTABLE CHEST 1 VIEW PORTABLE ABDOMEN 1 VIEW COMPARISON:  Chest and abdomen films 09/29/2023. FINDINGS: Chest AP portable 4:58 a.m.: ETT interval pullback to 4.5 cm from carina. NGT extends into the stomach with the tip in the antrum of stomach. Right  PICC tip remains at the superior cavoatrial junction. There are low lung volumes with increased dense left lower lobe consolidation. Unchanged small left pleural effusion. Left mid perihilar opacities are mildly improved, with remaining lungs generally clear. There is mild cardiomegaly. No vascular congestion findings. Stable mediastinum. No new osseous finding. Supine abdomen 5:05 a.m.: Overlying skin staples are still seen. The bowel pattern is nonobstructive. Scattered contrast remains in the left-sided colon. No dilated bowel is seen on the current exam. This could indicate resolution of previous bowel dilatation or fluid filling. There is no supine evidence of free air. No pathologic calcification or other significant findings. 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 IMPRESSION: 1. ETT interval pullback to 4.5 cm from carina. 2. NGT tip in the antrum of stomach. 3. Right PICC tip remains at the superior cavoatrial junction. 4. Low lung volumes with increased dense left lower lobe consolidation. 5. Unchanged small left pleural effusion. 6. Left mid perihilar opacities are mildly improved. 7. Nonobstructive bowel pattern. Scattered contrast remains in the left-sided colon. 8. No dilated bowel is seen on the current exam. This could indicate resolution of previous bowel dilatation or fluid filling. Electronically Signed   By: Almira Bar M.D.   On: 10/01/2023 07:58   DG Abd 1 View Result Date: 10/01/2023 CLINICAL DATA:  [id number].  Ileus. [id number].  Acute respiratory failure with hypoxia. EXAM: PORTABLE CHEST 1 VIEW PORTABLE ABDOMEN 1 VIEW  COMPARISON:  Chest and abdomen films 09/29/2023. FINDINGS: Chest AP portable 4:58 a.m.: ETT interval pullback to 4.5 cm from carina. NGT extends into the stomach with the tip in the antrum of stomach. Right PICC tip remains at the superior cavoatrial junction. There are low lung volumes with increased dense left lower lobe consolidation.  Unchanged small left pleural effusion. Left mid perihilar opacities are mildly improved, with remaining lungs generally clear. There is mild cardiomegaly. No vascular congestion findings. Stable mediastinum. No new osseous finding. Supine abdomen 5:05 a.m.: Overlying skin staples are still seen. The bowel pattern is nonobstructive. Scattered contrast remains in the left-sided colon. No dilated bowel is seen on the current exam. This could indicate resolution of previous bowel dilatation or fluid filling. There is no supine evidence of free air. No pathologic calcification or other significant findings. 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 IMPRESSION: 1. ETT interval pullback to 4.5 cm from carina. 2. NGT tip in the antrum of stomach. 3. Right PICC tip remains at the superior cavoatrial junction. 4. Low lung volumes with increased dense left lower lobe consolidation. 5. Unchanged small left pleural effusion. 6. Left mid perihilar opacities are mildly improved. 7. Nonobstructive bowel pattern. Scattered contrast remains in the left-sided colon. 8. No dilated bowel is seen on the current exam. This could indicate resolution of previous bowel dilatation or fluid filling. Electronically Signed   By: Almira Bar M.D.   On: 10/01/2023 07:58    Assessment/Plan: This is a 73 y.o. male s/p exlap, LOA, and colostomy revision, followed by emergent exlap for complete fascial dehiscence, now with partial dehiscence and aspiration pneumonia.  --Patient's abdomen with a partial deshiscence now exposing small bowel.  There is a thin layer of fibrinous material forming over the bowel.  Continue xeroform gauze dressing changes.  May be able to granulate over without need for further surgery. --Continue TF and advance as tolerated.  Continue checking residuals intermittently to make sure he's tolerating well.   --Patient is pending tracheostomy with ENT.  Depending on how his abdominal  wound is progressing, may consider surgical intervention while trach is being done, but if continues to granulate, may not do anything further.  Howie Ill, MD [facility]

## 2023-10-01 NOTE — Progress Notes (Signed)
SLP Cancellation Note  Patient Details Name: Calvin [name] MRN: [id number] DOB: 03-25-1951   Cancelled treatment:       Reason Eval/Treat Not Completed: Patient not medically ready;Medical issues which prohibited therapy (chart reviewed)   Per chart review of MD notes in recent 3 days:  1/25: Difficultly managing secretions, required REINTUBATION.  Abdominal wound with worsening dehiscence, Surgery holding off on return to OR unless wound separates further given his frailty 1/26: No SBT today due to reintubation yesterday. Will consult ENT for Tracheostomy given 2 failed extubation attempts.  Off vasopressors. 10/01/23- patient for tracheostomy tomorrow possibly.  He has failed 2 extubations.  Electrolytes reivewed and repleted.  Mild AKI noted non essential nephrotoxins reviewed and refined. Discussed with surgery, they think that trache is likely going to prolong suffering only.  Surgery Dr Aleen Campi was able to explain with open abdomen that is not amenable to closure wound dehisence and extravasation will occur and cough from trache will make things worse.  We will discuss all this with family and have GOC conversation.  (Surgery is having GOC discussion w/ Family today re: pt's status overall.)  ST services will sign off at this time w/ the recommendation for support by alternative means of feeding in setting of pt's complicated (seeming) medical status and significant medical decline and illness overall. Pt has a KNOWN BASELINE of Dysphagia also and would be at HIGH risk for aspiration/aspiration pneumonia w/ any attempted po intake w/ all of pt's current comorbidities.  Recommend Palliative Care/MDs to discuss GOC w/ pt/Family(ongoing). Recommend frequent oral care for hygiene and stimulation of swallowing.      Jerilynn Som, MS, CCC-SLP Speech Language Pathologist Rehab Services; [facility] [phone] (ascom) [place] 10/01/2023, 5:55 PM

## 2023-10-01 NOTE — Plan of Care (Signed)
  Problem: Clinical Measurements: Goal: Ability to maintain clinical measurements within normal limits will improve Outcome: Progressing Goal: Will remain free from infection Outcome: Progressing Goal: Diagnostic test results will improve Outcome: Progressing Goal: Respiratory complications will improve Outcome: Progressing Goal: Cardiovascular complication will be avoided Outcome: Progressing   Problem: Nutrition: Goal: Adequate nutrition will be maintained Outcome: Progressing   Problem: Elimination: Goal: Will not experience complications related to bowel motility Outcome: Progressing Goal: Will not experience complications related to urinary retention Outcome: Progressing   Problem: Elimination: Goal: Will not experience complications related to bowel motility Outcome: Progressing

## 2023-10-01 NOTE — Progress Notes (Signed)
NAME:  Calvin [name], MRN:  [id number], DOB:  [birth year], LOS: 28 ADMISSION DATE:  09/03/2023, CONSULTATION DATE:  09/21/2023 REFERRING MD:  Manuela Schwartz, NP, CHIEF COMPLAINT:  Acute Respiratory Distress, Aspiration event   Brief Pt Description / Synopsis:  73 y.o. male admitted with PMHx most significant for colon cancer status post transverse colectomy and colostomy, presented with nausea, vomiting, and shortness of breath.  Was admitted with Sepsis due to Influenza A infection, Aspiration pneumonia, and Small Bowel Obstruction.  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  Course further complicated by Aspiration event requiring intubation and mechanical ventilation on 1/17.  Has been unable to tolerate extubation on 2 occasions due to inability to manage secretions and protect his airway.  Plan for Tracheostomy placement.  History of Present Illness:  Calvin [name] is a 73 y.o. 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   Was admitted by [facility] for further workup and treatment of Was admitted with Sepsis due to Influenza A infection, Aspiration pneumonia, and Small Bowel Obstruction.  Please see "Significant Hospital Events" section below for full detailed hospital course.  Pertinent  Medical History   Past Medical History:  Diagnosis Date   Alcohol abuse    drinks on weekend   Anemia    Anxiety    Arthritis    Cancer (HCC)    colon,throat   COPD (chronic obstructive pulmonary disease) (HCC)    Coronary artery disease    Depression    Gout    Hemiplegia and  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 side (HCC)    Hypertension    Myocardial infarction (HCC)    Neuromuscular disorder (HCC)    Ogilvie syndrome    Seizures (HCC)    last 6 months ago   Stroke [facility])    multiple  left side weakness   Tremors of nervous system    Unstable angina (HCC)      Micro Data:  12/30: SARS-CoV-2/RSV/Flu PCR>>+ Influenza A 12/30: Blood cultures x2>>negative  12/30: Urine>>Staphylococcus Aureus 12/30: MRSA PCR + 1/17: Tracheal aspirate>>moderate pseudomonas  1/18: MRSA PCR +  Anti-infectives (From admission, onward)    Start     Dose/Rate Route Frequency Ordered Stop   09/23/23 1100  vancomycin (VANCOCIN) IVPB 1000 mg/200 mL premix  Status:  Discontinued        1,000 mg 200 mL/hr over 60 Minutes Intravenous Every 12 hours 09/23/23 0811 09/24/23 1021   09/22/23 2300  vancomycin (VANCOCIN) IVPB 1000 mg/200 mL premix  Status:  Discontinued        1,000 mg 200 mL/hr over 60 Minutes Intravenous Every 24 hours 09/22/23 0819 09/23/23 0811   09/22/23 1000  vancomycin (VANCOREADY) IVPB 1500 mg/300 mL        1,500 mg 150 mL/hr over 120 Minutes Intravenous  Once 09/22/23 0819 09/22/23 1916   09/21/23 0815  piperacillin-tazobactam (ZOSYN) IVPB 3.375 g        3.375 g 12.5 mL/hr over 240 Minutes Intravenous Every 8 hours 09/21/23 0729 09/27/23 2115   09/11/23 2000  cefoTEtan (CEFOTAN) 2 g in sodium chloride 0.9 % 100 mL IVPB  2 g 200 mL/hr over 30 Minutes Intravenous Every 8 hours 09/11/23 1801 09/12/23 1910   09/11/23 0600  cefoTEtan (CEFOTAN) 2 g in sodium chloride 0.9 % 100 mL IVPB        2 g 200 mL/hr over 30 Minutes Intravenous On call to O.R. 09/10/23 0858 09/12/23 1910   09/04/23 1200  Ampicillin-Sulbactam (UNASYN) 3 g in sodium chloride 0.9 % 100 mL IVPB  Status:  Discontinued        3 g 200 mL/hr over 30 Minutes Intravenous Every 6 hours 09/04/23 1052 09/08/23 1537   09/03/23 1800  ceFEPIme (MAXIPIME) 2 g in sodium  chloride 0.9 % 100 mL IVPB  Status:  Discontinued        2 g 200 mL/hr over 30 Minutes Intravenous Every 12 hours 09/03/23 0812 09/04/23 0903   09/03/23 1500  metroNIDAZOLE (FLAGYL) IVPB 500 mg  Status:  Discontinued        500 mg 100 mL/hr over 60 Minutes Intravenous Every 12 hours 09/03/23 0758 09/04/23 0903   09/03/23 1000  oseltamivir (TAMIFLU) capsule 30 mg        30 mg Oral 2 times daily 09/03/23 0757 09/08/23 0959   09/03/23 0315  ceFEPIme (MAXIPIME) 2 g in sodium chloride 0.9 % 100 mL IVPB        2 g 200 mL/hr over 30 Minutes Intravenous  Once 09/03/23 0304 09/03/23 0441   09/03/23 0315  metroNIDAZOLE (FLAGYL) IVPB 500 mg        500 mg 100 mL/hr over 60 Minutes Intravenous  Once 09/03/23 0304 09/03/23 0441   09/03/23 0315  vancomycin (VANCOCIN) IVPB 1000 mg/200 mL premix        1,000 mg 200 mL/hr over 60 Minutes Intravenous  Once 09/03/23 0304 09/03/23 0441      Significant Hospital Events: Including procedures, antibiotic start and stop dates in addition to other pertinent events   12/30: admitted to hospitalist sepsis, AKI, (+)Influenza A, likely aspiration pneumonia, acute hypoxic resp fail, SBO. NG placed, surgical consult.  12/31-01/06: relatively stable, remains SBO w/ NG and on TPN, worsening colostomy prolapse. Anemia w/o overt GIB, holding anti-plt/anti-cogs, GI no plans for endoscopic eval at this time 01/07: exlap, LOA, and colostomy revision  01/08: stable.  01/09: d/c NG 01/10: advancing diet as able. Hgb 6.8 - 1 unit PRBC given  01/11: persistent abd pain and cough, imaging concerning for abdominal surgical wound dehiscence deeper layers, going back to OR. Continue w/ cough suppression, await over-read but no obvious CT chest findings, suspect post-viral bronchitis  01/12: to OR early AM for closure of wound dehiscence. Postop stable, NG in place. CT chest question pneumonia but pt reports coughing improved, suspect was mostly reflux, will continue to monitor cough.  Postop care per general surgery include clamp NG and remain NPO for now   01/13: NG out, CLD 01/14: FLD, continue advancing diet tolerated  1/15: Advanced to dysphagia diet after ST eval.  Discontinue telemetry, palliative care consult, DC central line 1/16: Underwent CT abdomen/pelvis last evening.  Not a candidate for another surgery due to poor functional and nutritional status and multiple recent procedures.  Family deciding on goals of care/hospice 1/17: Aspirated this morning with development of severe respiratory distress and hypoxia requiring emergent intubation.  Now with developing septic shock.  Pt's daughter requesting transfer to [facility] or [facility].  Dr. Aleen Campi spoke both facilities which are at capacity and not accepting transfers. 1/18: Pt remains mechanically intubated overnight required increased  dose of fentanyl and vecuronium x1 dose due to vent dyssynchrony.  Pt currently unable to follow commands RASS goal -4.  Will perform WUA and for possible SBT.  Pt requiring levophed gtt @5  mcg/min and vasopressin 0.03 units/min to maintain map 65 or higher.  Pt extubated to Bipap able to follow commands  1/19: Pt with increased work of breathing due to increased secretions despite scheduled hypertonic saline, Chest PT, and Bipap requiring reintubation  1/20: On minimal vent settings, will keep intubated today as required reintubation yesterday.  Off Vasopressors. KUB with improving ileus, General Surgery ok with starting trickle feeds 1/21: On minimal vent settings.  Daughter at bedside for SBT, FAILED SBT due to tachypnea and increased WOB. 1/22: Pt remains mechanically intubated on PRVC vent settings PEEP 5/FiO2 30% with increased work of breathing; vbg revealed metabolic acidosis requiring 2 amps of bicarb  1/23: Pt remains mechanically intubated on minimal settings.  Tolerating TF's.  Will continue to optimize respiratory status today and plan for SBT on 01/24 with daughter at bedside  1/24:  On  minimal vents settings, daughter at bedside for WUA and SBT.  Following commands, EXTUBATED. 1/25: Difficultly managing secretions, required REINTUBATION.  Abdominal wound with worsening dehiscence, Surgery holding off on return to OR unless wound separates further given his frailty 1/26: No SBT today due to reintubation yesterday. Will consult ENT for Tracheostomy given 2 failed extubation attempts.  Off vasopressors. 10/01/23- patient for tracheostomy tommorow.  He has failed 2 extubations.  Electrolytes reivewed and repleted.  Mild AKI noted non essential nephrotoxins reviewed and refined. Discussed with surgery, they think that trache is likely going to prolong suffering only.  Surgery Dr Aleen Campi was able to explain with open abdomen that is not amenable to closure wound dehisence and extravasation will occur and cough from trache will make things worse.  We will discuss all this with family and have GOC conversation.   Interim History / Subjective:  As outlined above under significant events   Objective   Blood pressure (!) 101/56, pulse 73, temperature 98.3 F (36.8 C), resp. rate 18, height 5' 10.98" (1.803 m), weight 93.4 kg, SpO2 98%.    Vent Mode: PRVC FiO2 (%):  [35 %] 35 % Set Rate:  [18 bmp] 18 bmp Vt Set:  [500 mL] 500 mL PEEP:  [5 cmH20] 5 cmH20 Plateau Pressure:  [18 cmH20-19 cmH20] 19 cmH20   Intake/Output Summary (Last 24 hours) at 10/01/2023 1020 Last data filed at 10/01/2023 0600 Gross per 24 hour  Intake 1328.67 ml  Output 1850 ml  Net -521.33 ml   Filed Weights   09/29/23 0410 09/30/23 0436 10/01/23 0439  Weight: 93 kg 93.1 kg 93.4 kg   Examination: General: Acute on chronically-ill appearing male, mechanically intubated and sedated, in NAD HENT: Atraumatic, normocephalic, neck supple, orally intubated Lungs: Mechanical breath sounds throughout, even, synchronous with the vent Cardiovascular: NSR, s1s2, no m/r/g, 2+ radial/1+ distal pulses, no edema Abdomen: +BS  x4, soft, non distended, colostomy and mucous fistula in left upper quadrant dressing dry/intact, stool in bag dressing dry/intact, abdominal binder in place Extremities: Normal bulk and tone, no deformities Neuro: Sedated, withdraws from pain but currently not following commands, pupils PERRL GU: External catheter draining yellow urine   Resolved Hospital Problem list     Assessment & Plan:   #Acute metabolic encephalopathy #Mechanical intubation pain/discomfort  Hx: Seizure history and is on anti epileptic therapy at home - Maintain a RASS goal of 0 to -1 -  PAD protocol to maintain RASS goal: Dilaudid and Versed drips as needed - Avoid sedating medications as able - Continue scheduled baclofen  - Daily wake up assessment - Continue lamictal and keppra - Seizure precautions   #Acute hypoxic & hypercapnic respiratory failure #Large volume aspiration #COPD without acute exacerbation #Influenza A infection (present on admission) Failed 2 Extubation attempts - Full vent support for now  - Continue lung protective strategies - Maintain plateau pressures less than 30 cm H20 - Wean FiO2 & PEEP as tolerated to maintain O2 sats >92% - Follow intermittent CXR & ABG as needed - SBT once respiratory parameters met and mental status permits - Implement VAP Bundle - Scheduled and prn bronchodilator therapy  - IV steroids - ABX as outlined above - Consult ENT for Tracheostomy  #Shock: suspect septic +/- hypovolemic ~ RESOLVED #Chronic HFpEF Echocardiogram 09/22/23: EF 50%; left ventricle demonstrates global hypokinesis; mildly elevated pulmonary artery systolic pressure; large pleural effusion in the left lateral region; trivial mitral valve regurgitation; trivial aortic valve regurgitation  - Continuous telemetry monitoring - Trend lactic acid until normalized - Troponin peaked at 25 - Diuresis as BP and renal function permits  #AKI on CKD stage II~RESOLVED #Mild  Hypernatremia #BPH - Trend BMP - Replace electrolytes as indicated  - Ensure adequate renal perfusion - Avoid nephrotoxic agents as able - Replace electrolytes as indicated ~ Pharmacy following for assistance with electrolyte replacement - Start Free water flushes 100 ml q4h  #Aspiration pneumonia #Pseudomonas pneumonia ~ TREATED  #Influenza Infection (POA)~TREATED - Trend WBC and monitor fever curve - Trend PCT   - Follow cultures as above - Completed 7 day course of ABX as above  #Small bowel obstruction vs ileus #Colostomy irrigation/prolapse #Hx: ogilvie syndrome s/p colostomy - NPO; OGT to LIS - General Surgery following, appreciate input: continue TF's   #Anemia - Trend CBC  - Monitor for s/sx of bleeding  - Transfuse for hgb <7   #Prediabetes - CBG's q4hrs - Target range of 140 to 180 - SSI - Follow ICU Hypo/Hyperglycemia protocol   Patient is critically ill with shock and multiorgan failure.  Prognosis is guarded, high risk for further decompensation, cardiac arrest and death.  Given current critical illness superimposed on multiple chronic comorbidities including colon cancer, along with advanced age, overall long-term prognosis is poor.  Recommend consideration for DNR status.  Palliative care is following for ongoing goals of care discussions.  Patient's family currently requests full code and aggressive measures.   Best Practice (right click and "Reselect all SmartList Selections" daily)   Diet/type: TF's DVT prophylaxis: Lovenox  GI prophylaxis: PPI Lines: Central line and yes and it is still needed  Foley:  N/A  Code Status:  full code Last date of multidisciplinary goals of care discussion [09/30/23]    Labs   CBC: Recent Labs  Lab 09/27/23 0403 09/28/23 0308 09/29/23 0401 09/30/23 0359 10/01/23 0431  WBC 17.9* 14.9* 12.8* 8.5 12.3*  NEUTROABS 12.3* 8.4*  --   --   --   HGB 7.5* 7.9* 8.4* 8.0* 7.7*  HCT 24.7* 26.7* 27.4* 26.7* 27.1*  MCV  92.9 95.0 93.8 94.7 97.5  PLT 279 345 365 326 281    Basic Metabolic Panel: Recent Labs  Lab 09/25/23 1652 09/26/23 0535 09/27/23 0403 09/28/23 0308 09/29/23 0401 09/30/23 0351 10/01/23 0431  NA  --  141 145 146* 145 147* 146*  K  --  3.6 4.4 4.7 4.7 5.1 5.2*  CL  --  111 113* 112* 108 111 113*  CO2  --  21* 24 23 24 26 24   GLUCOSE  --  179* 200* 177* 100* 114* 147*  BUN  --  37* 37* 38* 31* 37* 55*  CREATININE  --  0.84 1.00 0.93 0.89 1.00 1.26*  CALCIUM  --  8.2* 8.1* 8.2* 8.4* 8.3* 8.1*  MG 2.1 2.0 2.1 2.3  --   --  2.4  PHOS 5.3* 2.3* 3.2 3.1 4.7* 5.4* 5.8*   GFR: Estimated Creatinine Clearance: 60 mL/min (A) (by C-G formula based on SCr of 1.26 mg/dL (H)). Recent Labs  Lab 09/28/23 0308 09/29/23 0401 09/30/23 0359 10/01/23 0431  WBC 14.9* 12.8* 8.5 12.3*    Liver Function Tests: Recent Labs  Lab 09/25/23 0435 09/26/23 0535 09/29/23 0401 09/30/23 0351 10/01/23 0431  ALBUMIN 1.8* 1.9* 2.4* 2.4* 2.2*   No results for input(s): "LIPASE", "AMYLASE" in the last 168 hours. No results for input(s): "AMMONIA" in the last 168 hours.  ABG    Component Value Date/Time   PHART 7.47 (H) 09/29/2023 1729   PCO2ART 38 09/29/2023 1729   PO2ART 62 (L) 09/29/2023 1729   HCO3 27.7 09/29/2023 1729   ACIDBASEDEF 0.8 09/27/2023 0813   O2SAT 92.6 09/29/2023 1729     Coagulation Profile: No results for input(s): "INR", "PROTIME" in the last 168 hours.  Cardiac Enzymes: No results for input(s): "CKTOTAL", "CKMB", "CKMBINDEX", "TROPONINI" in the last 168 hours.  HbA1C: Hgb A1c MFr Bld  Date/Time Value Ref Range Status  05/28/2022 06:17 PM 5.8 (H) 4.8 - 5.6 % Final    Comment:    (NOTE) Pre diabetes:          5.7%-6.4%  Diabetes:              >6.4%  Glycemic control for   <7.0% adults with diabetes   08/09/2019 07:31 AM 5.7 (H) 4.8 - 5.6 % Final    Comment:    (NOTE) Pre diabetes:          5.7%-6.4% Diabetes:              >6.4% Glycemic control for    <7.0% adults with diabetes     CBG: Recent Labs  Lab 09/30/23 1946 09/30/23 1959 09/30/23 2344 10/01/23 0352 10/01/23 0733  GLUCAP 99 109* 117* 144* 116*    Review of Systems:   Unable to assess due to Intubation/sedation/critical illness  Past Medical History:  He,  has a past medical history of Alcohol abuse, Anemia, Anxiety, Arthritis, Cancer (HCC), COPD (chronic obstructive pulmonary disease) (HCC), Coronary artery disease, Depression, Gout, Hemiplegia and 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 side (HCC), Hypertension, Myocardial infarction (HCC), Neuromuscular disorder (HCC), Ogilvie syndrome, Seizures (HCC), Stroke (HCC), Tremors of nervous system, and Unstable angina (HCC).   Surgical History:   Past Surgical History:  Procedure Laterality Date   CARPAL TUNNEL RELEASE Left 10/19/2015   Procedure: CARPAL TUNNEL RELEASE;  Surgeon: Kennedy Bucker, MD;  Location: [facility] ORS;  Service: Orthopedics;  Laterality: Left;   COLONOSCOPY WITH PROPOFOL N/A 10/26/2021   Procedure: COLONOSCOPY WITH PROPOFOL;  Surgeon: Regis Bill, MD;  Location: [facility] ENDOSCOPY;  Service: Endoscopy;  Laterality: N/A;   COLONOSCOPY WITH PROPOFOL N/A 06/02/2022   Procedure: COLONOSCOPY WITH PROPOFOL;  Surgeon: Regis Bill, MD;  Location: [facility] ENDOSCOPY;  Service: Endoscopy;  Laterality: N/A;   COLONOSCOPY WITH PROPOFOL N/A 06/06/2022   Procedure: COLONOSCOPY WITH PROPOFOL;  Surgeon: Regis Bill, MD;  Location: [facility]  ENDOSCOPY;  Service: Endoscopy;  Laterality: N/A;   COLOSTOMY REVISION N/A 09/11/2023   Procedure: COLOSTOMY REVISION;  Surgeon: Henrene Dodge, MD;  Location: [facility] ORS;  Service: General;  Laterality: N/A;   JOINT REPLACEMENT     left partial hip    LAPAROSCOPIC SIGMOID COLECTOMY  09/12/2010   Lap hand assisted sigmoidectomy, mobilization splenic flexure -- Dr. Freida Busman   LAPAROTOMY N/A 09/11/2023   Procedure: EXPLORATORY LAPAROTOMY;  Surgeon:  Henrene Dodge, MD;  Location: [facility] ORS;  Service: General;  Laterality: N/A;   LAPAROTOMY N/A 09/15/2023   Procedure: EXPLORATORY LAPAROTOMY;  Surgeon: Sung Amabile, DO;  Location: [facility] ORS;  Service: General;  Laterality: N/A;   LYSIS OF ADHESION N/A 09/11/2023   Procedure: LYSIS OF ADHESION;  Surgeon: Henrene Dodge, MD;  Location: [facility] ORS;  Service: General;  Laterality: N/A;   THROAT SURGERY  09/05/2011   cancer     Social History:   reports that he has quit smoking. His smoking use included cigarettes. He has never used smokeless tobacco. He reports that he does not currently use alcohol. He reports that he does not use drugs.   Family History:  His family history includes Cancer in his mother; Hypertension in his father; Leukemia in his brother.   Allergies No Known Allergies   Home Medications  Prior to Admission medications   Medication Sig Start Date End Date Taking? Authorizing Provider  aspirin 81 MG chewable tablet Take 81 mg by mouth daily.   Yes [provider]  baclofen (LIORESAL) 5 mg TABS tablet Take 5 mg by mouth 2 (two) times daily.   Yes [provider]  diazePAM, 20 MG Dose, (VALTOCO 20 MG DOSE) 2 x 10 MG/0.1ML LQPK Place 20 mg into the nose as needed (seizure lasting over 2 minutes). 08/23/23  Yes Leroy Sea, MD  ipratropium-albuterol (DUONEB) 0.5-2.5 (3) MG/3ML SOLN Inhale 3 mLs into the lungs every 6 (six) hours as needed. 09/02/23  Yes [provider]  lactulose (CHRONULAC) 10 GM/15ML solution Take 20 g by mouth daily. 08/15/23  Yes [provider]  lamoTRIgine (LAMICTAL) 25 MG tablet Take 50 mg by mouth 2 (two) times daily.   Yes [provider]  levETIRAcetam (KEPPRA) 1000 MG tablet Take 1 tablet (1,000 mg total) by mouth 2 (two) times daily. 08/23/23  Yes Leroy Sea, MD  Melatonin 10 MG TABS Take 10 mg by mouth at bedtime.   Yes [provider]  nitroGLYCERIN (NITROSTAT) 0.4 MG SL tablet Place  0.4 mg under the tongue 3 (three) times daily as needed for chest pain. Every 5 minutes up to 3 doses 08/24/23  Yes [provider]  pantoprazole (PROTONIX) 40 MG tablet Take 1 tablet (40 mg total) by mouth daily. 08/23/23  Yes Leroy Sea, MD  polyethylene glycol (MIRALAX / GLYCOLAX) 17 g packet Take 17 g by mouth 2 (two) times daily. 08/19/23  Yes Wieting, Richard, MD  pregabalin (LYRICA) 100 MG capsule Take 200 mg by mouth at bedtime.   Yes [provider]  senna (SENOKOT) 8.6 MG TABS tablet Take 2 tablets (17.2 mg total) by mouth at bedtime as needed for mild constipation or moderate constipation. 06/26/23  Yes Sunnie Nielsen, DO  tamsulosin (FLOMAX) 0.4 MG CAPS capsule Take 0.4 mg by mouth every evening.   Yes [provider]  divalproex (DEPAKOTE) 125 MG DR tablet Take 4 tablets (500 mg total) by mouth 2 (two) times daily for 1 day, THEN 4 tablets (500  mg total) daily for 3 days, THEN 2 tablets (250 mg total) daily for 3 days, THEN 1 tablet (125 mg total) daily for 3 days. Patient not taking: Reported on 09/03/2023 08/23/23 09/03/23  Leroy Sea, MD  fluticasone [facility]) 50 MCG/ACT nasal spray Place 2 sprays into both nostrils daily. Patient not taking: Reported on 09/03/2023 05/15/23   [provider]  Mouthwashes (MOUTH RINSE) LIQD solution 15 mLs by Mouth Rinse route every 2 (two) hours. Patient not taking: Reported on 09/03/2023 08/19/23   Alford Highland, MD  pregabalin (LYRICA) 50 MG capsule Take 50 mg capsule twice daily and 100 mg (two capsules) at bedtime Patient not taking: Reported on 09/03/2023 08/23/23   Leroy Sea, MD  tiotropium (SPIRIVA) 18 MCG inhalation capsule Place 18 mcg into inhaler and inhale daily. Patient not taking: Reported on 09/03/2023    [provider]     Critical care provider statement:   Total critical care time: 34 minutes   Performed by: Karna Christmas MD   Critical care time was  exclusive of separately billable procedures and treating other patients.   Critical care was necessary to treat or prevent imminent or life-threatening deterioration.   Critical care was time spent personally by me on the following activities: development of treatment plan with patient and/or surrogate as well as nursing, discussions with consultants, evaluation of patient's response to treatment, examination of patient, obtaining history from patient or surrogate, ordering and performing treatments and interventions, ordering and review of laboratory studies, ordering and review of radiographic studies, pulse oximetry and re-evaluation of patient's condition.    Vida Rigger, M.D.  Pulmonary & Critical Care Medicine

## 2023-10-01 NOTE — Progress Notes (Signed)
10/01/23  Had a long conversation with the patient's daughter, Ms. Calvin [name].  We discussed how his father was doing, and that he currently is scheduled for tracheostomy tomorrow.  Discussed with her that that skin edges of a portion of the abdominal wall separated more after he was extubated on 1/24, exposing a segment of small bowel under the skin.  The skin edges have separated more over the last few days and today is about 2-3 cm separation.  Discussed with her that from the abdominal standpoint, this is a very difficult situation.  Not doing anything could result in further exposure of his bowel or evisceration, but trying to do anything to close his abdominal wall has a very high risk of complications such as bowel perforation, peritonitis and sepsis, enterocutaneous or enteroatmospheric fistulas, and repeated failure of the closure attempt as he has done this twice already.  This would only set him back even further.  Discussed with her that he is already very weak.  When admitted on 09/03/23, he had already been deteriorating in his health and strength.  He was initially hit with the flu/pneumonia and small bowel obstruction with colostomy prolapse.  He was then hit again with exploratory laparotomy for his SBO and colostomy prolapse.  He was then hit again with the fascial dehiscence requiring another exploratory laparotomy and placement of retention sutures.  He was then hit again with the aspiration pneumonia requiring intubation and transfer to the ICU.  He has had more issues after with two further intubations, the last time being on 09/29/23.  He is very weakened from all these events.  His nutrition status is poor despite of using TPN and tube feeds.  He failed any swallowing attempt during SLP eval on 09/28/23.    Discussed with her the difference between quality and quantity, and the question of how much more we should do prolonging his life.  Presented tough questions about suffering and  prognosis.  Unfortunately, I do think his prognosis is poor and do not think he would survive to make a meaningful recovery.  Discussed with her that doing a tracheostomy would allow for the possibility of weaning his dependency on the ventilator, but there is no guarantee of this, and if there is any improvement, he would likely have some dependency on the ventilator for a long time, requiring him to be at an [facility] for continued support.  With that, he would also require some form of long term feeding tube as he would not be able to swallow well for a long time as well.  Unfortunately he is very weak and deconditioned.  His daughter understands that the prognosis is poor.  She would like to be able to give him as much chance as possible for any recovery, knowing that the odds are against him.  For now, she would like to keep him as FULL code in the case of cardiac arrest.  She would like to continue with the plan for tracheostomy tomorrow, but agrees that for now doing any surgery for his abdomen may only deteriorate his condition further.  She will reassess after the tracheostomy depending on how much or any progress her father can make weaning off the vent.  Discussed this with Dr. Karna Christmas and Ms. Nelson with the ICU team.  If any feeding tube is deemed necessary, it would have to be in the form of PEG with GI or percutaneous G tube with IR.  I spent more than 55 minutes dedicated  to the care of this patient on the date of this encounter to include phone conversation with the patient's daughter and discussion of plan with ICU team.  Calvin Dodge, MD

## 2023-10-01 NOTE — Plan of Care (Signed)
Problem: Education: Goal: Knowledge of General Education information will improve Description: Including pain rating scale, medication(s)/side effects and non-pharmacologic comfort measures 10/01/2023 1849 by Darci Needle, RN Outcome: Not Applicable 10/01/2023 1841 by Darci Needle, RN Outcome: Not Progressing   Problem: Health Behavior/Discharge Planning: Goal: Ability to manage health-related needs will improve 10/01/2023 1849 by Darci Needle, RN Outcome: Not Applicable 10/01/2023 1841 by Darci Needle, RN Outcome: Not Progressing   Problem: Clinical Measurements: Goal: Ability to maintain clinical measurements within normal limits will improve 10/01/2023 1849 by Darci Needle, RN Outcome: Not Applicable 10/01/2023 1841 by Darci Needle, RN Outcome: Not Progressing Goal: Will remain free from infection 10/01/2023 1849 by Darci Needle, RN Outcome: Not Applicable 10/01/2023 1841 by Darci Needle, RN Outcome: Progressing Goal: Diagnostic test results will improve 10/01/2023 1849 by Darci Needle, RN Outcome: Not Applicable 10/01/2023 1841 by Darci Needle, RN Outcome: Progressing Goal: Respiratory complications will improve 10/01/2023 1849 by Darci Needle, RN Outcome: Not Applicable 10/01/2023 1841 by Darci Needle, RN Outcome: Not Progressing Goal: Cardiovascular complication will be avoided Outcome: Not Applicable   Problem: Activity: Goal: Risk for activity intolerance will decrease Outcome: Not Applicable   Problem: Nutrition: Goal: Adequate nutrition will be maintained 10/01/2023 1849 by Darci Needle, RN Outcome: Not Applicable 10/01/2023 1841 by Darci Needle, RN Outcome: Progressing   Problem: Coping: Goal: Level of anxiety will decrease Outcome: Not Applicable   Problem: Elimination: Goal: Will not experience complications related to bowel motility 10/01/2023 1849 by Darci Needle,  RN Outcome: Not Applicable 10/01/2023 1841 by Darci Needle, RN Outcome: Progressing Goal: Will not experience complications related to urinary retention 10/01/2023 1849 by Darci Needle, RN Outcome: Not Applicable 10/01/2023 1841 by Darci Needle, RN Outcome: Not Progressing   Problem: Pain Management: Goal: General experience of comfort will improve 10/01/2023 1849 by Darci Needle, RN Outcome: Not Applicable 10/01/2023 1841 by Darci Needle, RN Outcome: Not Progressing   Problem: Safety: Goal: Ability to remain free from injury will improve 10/01/2023 1849 by Darci Needle, RN Outcome: Not Applicable 10/01/2023 1841 by Darci Needle, RN Outcome: Progressing   Problem: Skin Integrity: Goal: Risk for impaired skin integrity will decrease 10/01/2023 1849 by Darci Needle, RN Outcome: Not Applicable 10/01/2023 1841 by Darci Needle, RN Outcome: Progressing   Problem: Education: Goal: Knowledge of disease or condition will improve 10/01/2023 1849 by Darci Needle, RN Outcome: Not Applicable 10/01/2023 1841 by Darci Needle, RN Outcome: Not Progressing Goal: Knowledge of the prescribed therapeutic regimen will improve 10/01/2023 1849 by Darci Needle, RN Outcome: Not Applicable 10/01/2023 1841 by Darci Needle, RN Outcome: Not Progressing   Problem: Activity: Goal: Ability to tolerate increased activity will improve 10/01/2023 1849 by Darci Needle, RN Outcome: Not Applicable 10/01/2023 1841 by Darci Needle, RN Outcome: Not Progressing Goal: Will verbalize the importance of balancing activity with adequate rest periods 10/01/2023 1849 by Darci Needle, RN Outcome: Not Applicable 10/01/2023 1841 by Darci Needle, RN Outcome: Not Progressing   Problem: Respiratory: Goal: Ability to maintain a clear airway will improve 10/01/2023 1849 by Darci Needle, RN Outcome: Not Applicable 10/01/2023  1841 by Darci Needle, RN Outcome: Not Progressing Goal: Levels of oxygenation will improve 10/01/2023 1849 by Darci Needle, RN Outcome: Not Applicable 10/01/2023 1841 by Darci Needle, RN Outcome: Progressing Goal: Ability to maintain adequate ventilation will  improve 10/01/2023 1849 by Darci Needle, RN Outcome: Not Applicable 10/01/2023 1841 by Darci Needle, RN Outcome: Progressing   Problem: Activity: Goal: Ability to tolerate increased activity will improve 10/01/2023 1849 by Darci Needle, RN Outcome: Not Applicable 10/01/2023 1841 by Darci Needle, RN Outcome: Not Progressing   Problem: Respiratory: Goal: Ability to maintain a clear airway and adequate ventilation will improve Outcome: Not Applicable   Problem: Role Relationship: Goal: Method of communication will improve 10/01/2023 1849 by Darci Needle, RN Outcome: Not Applicable 10/01/2023 1841 by Darci Needle, RN Outcome: Not Progressing   Problem: Education: Goal: Ability to describe self-care measures that may prevent or decrease complications (Diabetes Survival Skills Education) will improve Outcome: Not Applicable   Problem: Coping: Goal: Ability to adjust to condition or change in health will improve 10/01/2023 1849 by Darci Needle, RN Outcome: Not Applicable 10/01/2023 1841 by Darci Needle, RN Outcome: Not Progressing   Problem: Fluid Volume: Goal: Ability to maintain a balanced intake and output will improve 10/01/2023 1849 by Darci Needle, RN Outcome: Not Applicable 10/01/2023 1841 by Darci Needle, RN Outcome: Not Progressing   Problem: Health Behavior/Discharge Planning: Goal: Ability to identify and utilize available resources and services will improve 10/01/2023 1849 by Darci Needle, RN Outcome: Not Applicable 10/01/2023 1841 by Darci Needle, RN Outcome: Not Progressing Goal: Ability to manage health-related needs will  improve 10/01/2023 1849 by Darci Needle, RN Outcome: Not Applicable 10/01/2023 1841 by Darci Needle, RN Outcome: Not Progressing   Problem: Metabolic: Goal: Ability to maintain appropriate glucose levels will improve 10/01/2023 1849 by Darci Needle, RN Outcome: Not Applicable 10/01/2023 1841 by Darci Needle, RN Outcome: Not Progressing   Problem: Nutritional: Goal: Maintenance of adequate nutrition will improve 10/01/2023 1849 by Darci Needle, RN Outcome: Not Applicable 10/01/2023 1841 by Darci Needle, RN Outcome: Not Progressing Goal: Progress toward achieving an optimal weight will improve 10/01/2023 1849 by Darci Needle, RN Outcome: Not Applicable 10/01/2023 1841 by Darci Needle, RN Outcome: Not Progressing   Problem: Skin Integrity: Goal: Risk for impaired skin integrity will decrease 10/01/2023 1849 by Darci Needle, RN Outcome: Not Applicable 10/01/2023 1841 by Darci Needle, RN Outcome: Not Progressing   Problem: Tissue Perfusion: Goal: Adequacy of tissue perfusion will improve 10/01/2023 1849 by Darci Needle, RN Outcome: Not Applicable 10/01/2023 1841 by Darci Needle, RN Outcome: Progressing

## 2023-10-02 ENCOUNTER — Inpatient Hospital Stay: Payer: Medicare Other | Admitting: Certified Registered Nurse Anesthetist

## 2023-10-02 ENCOUNTER — Encounter: Admission: EM | Disposition: E | Payer: Self-pay | Source: Home / Self Care | Attending: Obstetrics and Gynecology

## 2023-10-02 DIAGNOSIS — J9601 Acute respiratory failure with hypoxia: Secondary | ICD-10-CM | POA: Diagnosis not present

## 2023-10-02 DIAGNOSIS — Z7189 Other specified counseling: Secondary | ICD-10-CM | POA: Diagnosis not present

## 2023-10-02 LAB — GLUCOSE, CAPILLARY
Glucose-Capillary: 131 mg/dL — ABNORMAL HIGH (ref 70–99)
Glucose-Capillary: 148 mg/dL — ABNORMAL HIGH (ref 70–99)
Glucose-Capillary: 131 mg/dL — ABNORMAL HIGH (ref 70–99)
Glucose-Capillary: 160 mg/dL — ABNORMAL HIGH (ref 70–99)
Glucose-Capillary: 83 mg/dL (ref 70–99)
Glucose-Capillary: 131 mg/dL — ABNORMAL HIGH (ref 70–99)

## 2023-10-02 LAB — C-REACTIVE PROTEIN: CRP: 7 mg/dL — ABNORMAL HIGH (ref <1.0)

## 2023-10-02 LAB — RENAL FUNCTION PANEL
Albumin: 2.5 g/dL — ABNORMAL LOW (ref 3.5–5.0)
Anion gap: 5 (ref 5–15)
BUN: 47 mg/dL — ABNORMAL HIGH (ref 8–23)
CO2: 26 mmol/L (ref 22–32)
Calcium: 8.2 mg/dL — ABNORMAL LOW (ref 8.9–10.3)
Chloride: 115 mmol/L — ABNORMAL HIGH (ref 98–111)
Creatinine, Ser: 1.04 mg/dL (ref 0.61–1.24)
GFR, Estimated: >60 mL/min (ref >60)
Glucose, Bld: 143 mg/dL — ABNORMAL HIGH (ref 70–99)
Phosphorus: 4.2 mg/dL (ref 2.5–4.6)
Potassium: 4 mmol/L (ref 3.5–5.1)
Sodium: 146 mmol/L — ABNORMAL HIGH (ref 135–145)

## 2023-10-02 LAB — CBC
HCT: 28 % — ABNORMAL LOW (ref 39.0–52.0)
Hemoglobin: 8.2 g/dL — ABNORMAL LOW (ref 13.0–17.0)
MCH: 28.3 pg (ref 26.0–34.0)
MCHC: 29.3 g/dL — ABNORMAL LOW (ref 30.0–36.0)
MCV: 96.6 fL (ref 80.0–100.0)
Platelets: 316 10*3/uL (ref 150–400)
RBC: 2.9 MIL/uL — ABNORMAL LOW (ref 4.22–5.81)
RDW: 16.7 % — ABNORMAL HIGH (ref 11.5–15.5)
WBC: 12.6 10*3/uL — ABNORMAL HIGH (ref 4.0–10.5)
nRBC: 0.2 % (ref 0.0–0.2)

## 2023-10-02 LAB — COOXEMETRY PANEL
Carboxyhemoglobin: 1.8 % — ABNORMAL HIGH (ref 0.5–1.5)
Methemoglobin: <0.7 % (ref 0.0–1.5)
O2 Saturation: 73.2 %
Total hemoglobin: 10.8 g/dL — ABNORMAL LOW (ref 12.0–16.0)
Total oxygen content: 71.6 %

## 2023-10-02 LAB — MAGNESIUM: Magnesium: 2.3 mg/dL (ref 1.7–2.4)

## 2023-10-02 SURGERY — TRACHEOSTOMY
Anesthesia: General

## 2023-10-02 MED ORDER — HEPARIN SODIUM (PORCINE) 5000 UNIT/ML IJ SOLN
5000.0000 [IU] | Freq: Three times a day (TID) | INTRAMUSCULAR | Status: DC
  Administered 2023-10-02 – 2023-10-06 (×11): 5000 [IU] via SUBCUTANEOUS
  Filled 2023-10-02 (×10): qty 1

## 2023-10-02 NOTE — Plan of Care (Signed)
  Problem: Clinical Measurements: Goal: Will remain free from infection Outcome: Not Progressing Goal: Diagnostic test results will improve Outcome: Not Progressing   Problem: Elimination: Goal: Will not experience complications related to bowel motility Outcome: Not Progressing Goal: Will not experience complications related to urinary retention Outcome: Not Progressing   Problem: Pain Managment: Goal: General experience of comfort will improve and/or be controlled Outcome: Not Progressing   Problem: Safety: Goal: Ability to remain free from injury will improve Outcome: Not Progressing   Problem: Skin Integrity: Goal: Risk for impaired skin integrity will decrease Outcome: Not Progressing

## 2023-10-02 NOTE — Progress Notes (Signed)
   10/02/23 1015  Spiritual Encounters  Type of Visit Initial  Care provided to: Patient  Referral source Chaplain assessment;Nurse (RN/NT/LPN)  Reason for visit Routine spiritual support  OnCall Visit No  Spiritual Framework  Presenting Themes Other (comment) (Pt not able to respond)  Interventions  Spiritual Care Interventions Made Compassionate presence  Intervention Outcomes  Outcomes Other (comment) (Pt not able to respond)

## 2023-10-02 NOTE — Progress Notes (Signed)
EKG Obtained, Orders to stop levo, and  milrinone per Cheryll Cockayne, NP  Albertine Patricia, RN 10/02/2023

## 2023-10-02 NOTE — TOC Progression Note (Signed)
Transition of Care [facility]) - Progression Note    Patient Details  Name: Calvin [name] MRN: [id number] Date of Birth: October 30, 1950  [facility] CM/SW Contact  Margarito Liner, LCSW Phone Number: 10/02/2023, 1:24 PM  Clinical Narrative:   Received call from [facility] admissions coordinator. They are unable to manage trachs. CSW sent secure chat to team to notify.  Expected Discharge Plan: Long Term Nursing Home Barriers to Discharge: Continued Medical Work up  Expected Discharge Plan and Services                                               Social Determinants of Health (SDOH) Interventions SDOH Screenings   Food Insecurity: No Food Insecurity (09/11/2023)  Housing: Low Risk  (09/11/2023)  Transportation Needs: No Transportation Needs (09/11/2023)  Utilities: Not At Risk (09/11/2023)  Social Connections: Unknown (09/11/2023)  Tobacco Use: Medium Risk (09/03/2023)    Readmission Risk Interventions    08/20/2023   12:32 PM 07/17/2023   12:24 PM 06/30/2022    2:25 PM  Readmission Risk Prevention Plan  Transportation Screening Complete Complete Complete  PCP or Specialist Appt within 3-5 Days  Complete   Social Work Consult for Recovery Care Planning/Counseling  Complete   Palliative Care Screening  Not Applicable   Medication Review Oceanographer) Complete Complete   PCP or Specialist appointment within 3-5 days of discharge Complete    HRI or Home Care Consult Complete  Complete  SW Recovery Care/Counseling Consult Complete  Complete  Palliative Care Screening Not Applicable  Complete  Skilled Nursing Facility Complete  Complete

## 2023-10-02 NOTE — Progress Notes (Signed)
New onset Atrial Fibrillation  CHA2DS2-VASc score: 5 (HTN, CVA, MI, age Rate controlled currently, on milrinone & levophed - stop milrinone & levophed, consider phenylephrine PRN to maintain MAP > 65 - Consider Cardizem drip if rate uncontrolled as hemodynamics tolerate - due to partial dehiscence of abdominal surgical site with exposed small bowel unable to initiate systemic anticoagulation at this time, defer initiation to surgery - f/u TSH & thyroid panel - Tracheostomy cancelled earlier today, poor overall outcomes expressed by general surgery. Consider Cardiology consultation depending on GOC conversations  - Continuous cardiac monitoring   Calvin [name], AGACNP-BC Acute Care Nurse Practitioner [facility]   [phone] / [phone] Please see Amion for details.

## 2023-10-02 NOTE — Progress Notes (Signed)
Daily Progress Note   [name]       Date: 10/02/2023 DOB: Dec 10, 1950  Age: 73 y.o. MRN#: [id number] Attending Physician: Vida Rigger, MD Primary Care Physician: Keane Police, MD Admit Date: 09/03/2023  Reason for Consultation/Follow-up: Establishing goals of care  Subjective: Notes and labs reviewed.  Plans in place for going to OR for trach.  Patient is currently resting in bed on ventilator support, no family at bedside.  Called to speak with patient's daughter.  She discusses concerns she has with trach as well as with extubation.  She weighs risk and benefit.  Discussed multiple scenarios moving forward, and quality of life in the scenarios.  She is unsure of her wishes and how she wants to proceed.  Phone handed to CCM NP who also spoke with daughter.  Currently plans are in place to hold on plan for tracheostomy and have surgeon speak with daughter to answer further questions.  Length of Stay: 29  Current Medications: Scheduled Meds:   Chlorhexidine Gluconate Cloth  6 each Topical Daily   docusate  100 mg Per Tube BID   free water  150 mL Per Tube Q4H   furosemide  20 mg Intravenous Daily   insulin aspart  0-15 Units Subcutaneous Q4H   ipratropium-albuterol  3 mL Inhalation TID   lamoTRIgine  50 mg Per Tube BID   methylPREDNISolone (SOLU-MEDROL) injection  20 mg Intravenous Q12H   nutrition supplement (JUVEN)  1 packet Per Tube BID BM   mouth rinse  15 mL Mouth Rinse Q2H   oxyCODONE  5 mg Per Tube Q6H   pantoprazole (PROTONIX) IV  40 mg Intravenous Q12H   polyethylene glycol  17 g Per Tube Daily   pregabalin  100 mg Per Tube Daily   pregabalin  200 mg Per Tube QHS   sodium chloride flush  10-40 mL Intracatheter Q12H   sodium chloride flush   3-10 mL Intravenous Q12H    Continuous Infusions:  feeding supplement (PIVOT 1.5 CAL) 60 mL/hr at 10/02/23 0408   HYDROmorphone 3 mg/hr (10/02/23 0516)   levETIRAcetam Stopped (10/01/23 2244)   midazolam 1 mg/hr (10/02/23 0408)   milrinone 0.25 mcg/kg/min (10/02/23 0518)   norepinephrine (LEVOPHED) Adult infusion 2 mcg/min (10/02/23 0408)    PRN Meds: docusate, fentaNYL (SUBLIMAZE) injection, glycopyrrolate,  HYDROmorphone, ipratropium-albuterol, mouth rinse, polyvinyl alcohol, senna, sodium chloride flush, sodium chloride flush  Physical Exam Constitutional:      Comments: Eyes closed  Pulmonary:     Comments: On ventilator            Vital Signs: BP 132/72   Pulse 72   Temp 97.8 F (36.6 C) (Oral)   Resp 18   Ht 5' 10.98" (1.803 m)   Wt 93.4 kg   SpO2 98%   BMI 28.73 kg/m  SpO2: SpO2: 98 % O2 Device: O2 Device: Ventilator O2 Flow Rate: O2 Flow Rate (L/min): 2 L/min  Intake/output summary:  Intake/Output Summary (Last 24 hours) at 10/02/2023 1146 Last data filed at 10/02/2023 0408 Gross per 24 hour  Intake 1327.02 ml  Output 2525 ml  Net -1197.98 ml   LBM: Last BM Date : 09/30/23 Baseline Weight: Weight: 97.8 kg Most recent weight: Weight: 93.4 kg          Patient Active Problem List   Diagnosis Date Noted   Aspiration into airway 09/21/2023   Small bowel obstruction due to adhesions (HCC) 09/11/2023   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   Cancer of sigmoid (HCC) 09/04/2023   Gout 09/04/2023   Acute respiratory failure with hypoxia (HCC) 09/03/2023   Acute metabolic encephalopathy 08/19/2023   History of CVA (cerebrovascular accident) 08/19/2023   Thrombocytopenia (HCC) 08/19/2023   Hypotension 07/15/2023   Small bowel obstruction (HCC) 07/15/2023   Hemiplegia and 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 side (HCC)    Temporal pain 06/18/2022   Acute kidney injury superimposed on CKD (HCC) 06/17/2022   Dysphagia  06/07/2022   Respiratory distress 06/02/2022   Neuropathy 06/02/2022   Paroxysmal atrial flutter (HCC) 06/01/2022   Aspiration pneumonia (HCC) 06/01/2022   Cervical spinal stenosis 05/31/2022   Chronic diastolic CHF (congestive heart failure) (HCC) 05/28/2022   SBO, recurrent (small bowel obstruction) (HCC)    Abdominal distention    HLD (hyperlipidemia) 10/16/2021   Nausea vomiting and diarrhea 10/16/2021   Sepsis (HCC) 10/16/2021   Tobacco abuse 10/16/2021   Muscle twitching 08/14/2019   UTI (urinary tract infection) 08/03/2019   Ileus (HCC) 08/03/2019   Hypokalemia 08/03/2019   QT prolongation 08/03/2019   Ogilvie syndrome    Acute abdominal pain 04/03/2019   Left-sided weakness 09/30/2017   Seizure (HCC) 09/30/2017   Essential hypertension 09/30/2017   CAD (coronary artery disease) 09/30/2017   COPD (chronic obstructive pulmonary disease) (HCC) 09/30/2017   Depression with anxiety 09/30/2017    Palliative Care Assessment & Plan     Recommendations/Plan: Daughter wants to wait on tracheostomy at this time.  She would like to speak with surgery further prior to making decision.  Code Status:    Code Status Orders  (From admission, onward)           Start     Ordered   09/03/23 0754  Full code  Continuous       Question:  By:  Answer:  Consent: discussion documented in EHR   09/03/23 0755           Code Status History     Date Active Date Inactive Code Status Order ID Comments User Context   08/19/2023 1314 08/23/2023 1838 Full Code [id number]  Alan Mulder, MD Inpatient   08/18/2023 1448 08/19/2023 1154 Full Code [id number]  Emeline General, MD ED   07/18/2023 1748 07/24/2023 1712 Full Code [id number]  Ashok Pall, Wilfred Curtis, MD Inpatient   07/15/2023 [phone]  07/18/2023 1551 Full Code [id number]  Andris Baumann, MD ED   06/22/2023 [phone] 06/27/2023 0108 Full Code [id number]  Emeline General, MD ED   05/28/2022 1622 06/30/2022 2326 Full Code [id number]  Lorretta Harp, MD  ED   10/23/2021 2158 11/02/2021 2029 Full Code [id number]  Lajoyce Corners, NP Inpatient   10/16/2021 1144 10/23/2021 2158 DNR [id number]  Lorretta Harp, MD ED   10/02/2021 0731 10/02/2021 1641 DNR [id number]  Loleta Rose, MD ED   08/03/2019 2226 08/14/2019 2350 Full Code [id number]  Lurene Shadow, MD Inpatient   04/03/2019 1711 04/08/2019 1731 Full Code [id number]  Auburn Bilberry, MD Inpatient   04/03/2019 1627 04/03/2019 1710 DNR [id number]  Auburn Bilberry, MD ED   09/30/2017 2301 10/02/2017 2039 Full Code [id number]  Oralia Manis, MD Inpatient       Prognosis: Poor    Care plan was discussed with CCM  Thank you for allowing the Palliative Medicine Team to assist in the care of this patient.     Morton Stall, NP  Please contact Palliative Medicine Team phone at [phone] for questions and concerns.

## 2023-10-02 NOTE — Progress Notes (Signed)
Updated pts brother Italo Banton and pts sister-in-law at bedside regarding pts condition and plan of care.  All questions were answered will continue to monitor and assess pt.  Zada Girt, AGNP  Pulmonary/Critical Care Pager [phone] (please enter 7 digits) PCCM Consult Pager [phone] (please enter 7 digits)

## 2023-10-02 NOTE — Anesthesia Preprocedure Evaluation (Signed)
Anesthesia Evaluation  Patient identified by MRN, date of birth, ID band Patient unresponsive    Reviewed: Allergy & Precautions, NPO status , Patient's Chart, lab work & pertinent test results  Airway Mallampati: Intubated  TM Distance: >3 FB     Dental  (+) Chipped   Pulmonary pneumonia, COPD, former smoker   + rhonchi  + decreased breath sounds      Cardiovascular hypertension, + CAD, + Past MI and +CHF  Normal cardiovascular exam     Neuro/Psych  Headaches, Seizures -,  PSYCHIATRIC DISORDERS       Neuromuscular disease CVA    GI/Hepatic negative GI ROS, Neg liver ROS,,,  Endo/Other  negative endocrine ROS    Renal/GU Renal disease     Musculoskeletal   Abdominal   Peds  Hematology negative hematology ROS (+)   Anesthesia Other Findings Past Medical History: No date: Alcohol abuse     Comment:  drinks on weekend No date: Anemia No date: Anxiety No date: Arthritis No date: Cancer (HCC)     Comment:  colon,throat No date: COPD (chronic obstructive pulmonary disease) (HCC) No date: Coronary artery disease No date: Depression No date: Gout No date: Hemiplegia and hemiparesis following cerebral infarction  affecting left non-dominant side (HCC) No date: Hypertension No date: Myocardial infarction (HCC) No date: 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 No date: Seizures (HCC)     Comment:  last 6 months ago No date: Stroke [facility])     Comment:  multiple  left side weakness No date: Tremors of nervous system No date: Unstable angina (HCC)  Past Surgical History: 10/19/2015: CARPAL TUNNEL RELEASE; Left     Comment:  Procedure: CARPAL TUNNEL RELEASE;  Surgeon: Kennedy Bucker, MD;  Location: [facility];  Service: Orthopedics;                Laterality: Left; 10/26/2021: COLONOSCOPY WITH PROPOFOL; N/A     Comment:  Procedure: COLONOSCOPY WITH PROPOFOL;  Surgeon:                Regis Bill, MD;  Location: [facility];                Service: Endoscopy;  Laterality: N/A; 06/02/2022: COLONOSCOPY WITH PROPOFOL; N/A     Comment:  Procedure: COLONOSCOPY WITH PROPOFOL;  Surgeon:               Regis Bill, MD;  Location: [facility];                Service: Endoscopy;  Laterality: N/A; 06/06/2022: COLONOSCOPY WITH PROPOFOL; N/A     Comment:  Procedure: COLONOSCOPY WITH PROPOFOL;  Surgeon:               Regis Bill, MD;  Location: [facility];                Service: Endoscopy;  Laterality: N/A; 09/11/2023: COLOSTOMY REVISION; N/A     Comment:  Procedure: COLOSTOMY REVISION;  Surgeon: Henrene Dodge,               MD;  Location: [facility];  Service: General;  Laterality:               N/A; No date: JOINT REPLACEMENT     Comment:  left partial hip  09/12/2010: LAPAROSCOPIC SIGMOID COLECTOMY     Comment:  Lap hand assisted sigmoidectomy, mobilization splenic  flexure -- Dr. Freida Busman 09/11/2023: LAPAROTOMY; N/A     Comment:  Procedure: EXPLORATORY LAPAROTOMY;  Surgeon: Henrene Dodge, MD;  Location: [facility];  Service: General;                Laterality: N/A; 09/15/2023: LAPAROTOMY; N/A     Comment:  Procedure: EXPLORATORY LAPAROTOMY;  Surgeon: Sung Amabile, DO;  Location: [facility];  Service: General;                Laterality: N/A; 09/11/2023: LYSIS OF ADHESION; N/A     Comment:  Procedure: LYSIS OF ADHESION;  Surgeon: Henrene Dodge,               MD;  Location: [facility];  Service: General;  Laterality:               N/A; 09/05/2011: THROAT SURGERY     Comment:  cancer  BMI    Body Mass Index: 28.73 kg/m      Reproductive/Obstetrics negative OB ROS                             Anesthesia Physical Anesthesia Plan  ASA: 4  Anesthesia Plan: General ETT   Post-op Pain Management:    Induction: Intravenous  PONV Risk Score and Plan: Ondansetron, Dexamethasone, Midazolam  and Treatment may vary due to age or medical condition  Airway Management Planned: Oral ETT  Additional Equipment:   Intra-op Plan:   Post-operative Plan: Post-operative intubation/ventilation  Informed Consent: I have reviewed the patients History and Physical, chart, labs and discussed the procedure including the risks, benefits and alternatives for the proposed anesthesia with the patient or authorized representative who has indicated his/her understanding and acceptance.     Dental Advisory Given  Plan Discussed with: Anesthesiologist, CRNA and Surgeon  Anesthesia Plan Comments: (History and phone consent from the patients daughter Georgina Quint at [phone]  Daughter consented for risks of anesthesia including but not limited to:  - adverse reactions to medications - damage to eyes, teeth, lips or other oral mucosa - nerve damage due to positioning  - sore throat or hoarseness - Damage to heart, brain, nerves, lungs, other parts of body or loss of life  She voiced understanding and assent.)       Anesthesia Quick Evaluation

## 2023-10-02 NOTE — Progress Notes (Signed)
NAME:  Calvin [name], MRN:  [id number], DOB:  1950-10-28, LOS: 29 ADMISSION DATE:  09/03/2023, CONSULTATION DATE:  09/21/2023 REFERRING MD:  Manuela Schwartz, NP, CHIEF COMPLAINT:  Acute Respiratory Distress, Aspiration event   Brief Pt Description / Synopsis:  73 y.o. male admitted with PMHx most significant for colon cancer status post transverse colectomy and colostomy, presented with nausea, vomiting, and shortness of breath.  Was admitted with Sepsis due to Influenza A infection, Aspiration pneumonia, and Small Bowel Obstruction.  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  Course further complicated by Aspiration event requiring intubation and mechanical ventilation on 1/17.  Has been unable to tolerate extubation on 2 occasions due to inability to manage secretions and protect his airway.  Plan for Tracheostomy placement.  History of Present Illness:  Calvin [name] is a 73 y.o. 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   Was admitted by [facility] for further workup and treatment of Was admitted with Sepsis due to Influenza A infection, Aspiration pneumonia, and Small Bowel Obstruction.  Please see "Significant Hospital Events" section below for full detailed hospital course.  10/02/23- patient had good UOP today, tracheostomy was cancelled.   Pertinent  Medical History   Past Medical History:  Diagnosis Date   Alcohol abuse    drinks on weekend   Anemia    Anxiety    Arthritis    Cancer (HCC)    colon,throat   COPD (chronic obstructive pulmonary disease) (HCC)     Coronary artery disease    Depression    Gout    Hemiplegia and 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 side (HCC)    Hypertension    Myocardial infarction (HCC)    Neuromuscular disorder (HCC)    Ogilvie syndrome    Seizures (HCC)    last 6 months ago   Stroke [facility])    multiple  left side weakness   Tremors of nervous system    Unstable angina (HCC)      Micro Data:  12/30: SARS-CoV-2/RSV/Flu PCR>>+ Influenza A 12/30: Blood cultures x2>>negative  12/30: Urine>>Staphylococcus Aureus 12/30: MRSA PCR + 1/17: Tracheal aspirate>>moderate pseudomonas  1/18: MRSA PCR +  Anti-infectives (From admission, onward)    Start     Dose/Rate Route Frequency Ordered Stop   09/23/23 1100  vancomycin (VANCOCIN) IVPB 1000 mg/200 mL premix  Status:  Discontinued        1,000 mg 200 mL/hr over 60 Minutes Intravenous Every 12 hours 09/23/23 0811 09/24/23 1021   09/22/23 2300  vancomycin (VANCOCIN) IVPB 1000 mg/200 mL premix  Status:  Discontinued        1,000 mg 200 mL/hr over 60 Minutes Intravenous Every 24 hours 09/22/23 0819 09/23/23 0811   09/22/23 1000  vancomycin (VANCOREADY) IVPB 1500 mg/300 mL        1,500 mg 150 mL/hr over 120 Minutes Intravenous  Once 09/22/23 0819 09/22/23 1916   09/21/23 0815  piperacillin-tazobactam (ZOSYN) IVPB 3.375 g        3.375 g 12.5 mL/hr over 240 Minutes Intravenous Every 8 hours 09/21/23 0729 09/27/23 2115   09/11/23 2000  cefoTEtan (CEFOTAN)  2 g in sodium chloride 0.9 % 100 mL IVPB        2 g 200 mL/hr over 30 Minutes Intravenous Every 8 hours 09/11/23 1801 09/12/23 1910   09/11/23 0600  cefoTEtan (CEFOTAN) 2 g in sodium chloride 0.9 % 100 mL IVPB        2 g 200 mL/hr over 30 Minutes Intravenous On call to O.R. 09/10/23 0858 09/12/23 1910   09/04/23 1200  Ampicillin-Sulbactam (UNASYN) 3 g in sodium chloride 0.9 % 100 mL IVPB  Status:  Discontinued        3 g 200 mL/hr over 30 Minutes Intravenous Every 6 hours 09/04/23  1052 09/08/23 1537   09/03/23 1800  ceFEPIme (MAXIPIME) 2 g in sodium chloride 0.9 % 100 mL IVPB  Status:  Discontinued        2 g 200 mL/hr over 30 Minutes Intravenous Every 12 hours 09/03/23 0812 09/04/23 0903   09/03/23 1500  metroNIDAZOLE (FLAGYL) IVPB 500 mg  Status:  Discontinued        500 mg 100 mL/hr over 60 Minutes Intravenous Every 12 hours 09/03/23 0758 09/04/23 0903   09/03/23 1000  oseltamivir (TAMIFLU) capsule 30 mg        30 mg Oral 2 times daily 09/03/23 0757 09/08/23 0959   09/03/23 0315  ceFEPIme (MAXIPIME) 2 g in sodium chloride 0.9 % 100 mL IVPB        2 g 200 mL/hr over 30 Minutes Intravenous  Once 09/03/23 0304 09/03/23 0441   09/03/23 0315  metroNIDAZOLE (FLAGYL) IVPB 500 mg        500 mg 100 mL/hr over 60 Minutes Intravenous  Once 09/03/23 0304 09/03/23 0441   09/03/23 0315  vancomycin (VANCOCIN) IVPB 1000 mg/200 mL premix        1,000 mg 200 mL/hr over 60 Minutes Intravenous  Once 09/03/23 0304 09/03/23 0441      Significant Hospital Events: Including procedures, antibiotic start and stop dates in addition to other pertinent events   12/30: admitted to hospitalist sepsis, AKI, (+)Influenza A, likely aspiration pneumonia, acute hypoxic resp fail, SBO. NG placed, surgical consult.  12/31-01/06: relatively stable, remains SBO w/ NG and on TPN, worsening colostomy prolapse. Anemia w/o overt GIB, holding anti-plt/anti-cogs, GI no plans for endoscopic eval at this time 01/07: exlap, LOA, and colostomy revision  01/08: stable.  01/09: d/c NG 01/10: advancing diet as able. Hgb 6.8 - 1 unit PRBC given  01/11: persistent abd pain and cough, imaging concerning for abdominal surgical wound dehiscence deeper layers, going back to OR. Continue w/ cough suppression, await over-read but no obvious CT chest findings, suspect post-viral bronchitis  01/12: to OR early AM for closure of wound dehiscence. Postop stable, NG in place. CT chest question pneumonia but pt reports  coughing improved, suspect was mostly reflux, will continue to monitor cough. Postop care per general surgery include clamp NG and remain NPO for now   01/13: NG out, CLD 01/14: FLD, continue advancing diet tolerated  1/15: Advanced to dysphagia diet after ST eval.  Discontinue telemetry, palliative care consult, DC central line 1/16: Underwent CT abdomen/pelvis last evening.  Not a candidate for another surgery due to poor functional and nutritional status and multiple recent procedures.  Family deciding on goals of care/hospice 1/17: Aspirated this morning with development of severe respiratory distress and hypoxia requiring emergent intubation.  Now with developing septic shock.  Pt's daughter requesting transfer to [facility] or [facility]  Dr. Aleen Campi spoke both  facilities which are at capacity and not accepting transfers. 1/18: Pt remains mechanically intubated overnight required increased dose of fentanyl and vecuronium x1 dose due to vent dyssynchrony.  Pt currently unable to follow commands RASS goal -4.  Will perform WUA and for possible SBT.  Pt requiring levophed gtt @5  mcg/min and vasopressin 0.03 units/min to maintain map 65 or higher.  Pt extubated to Bipap able to follow commands  1/19: Pt with increased work of breathing due to increased secretions despite scheduled hypertonic saline, Chest PT, and Bipap requiring reintubation  1/20: On minimal vent settings, will keep intubated today as required reintubation yesterday.  Off Vasopressors. KUB with improving ileus, General Surgery ok with starting trickle feeds 1/21: On minimal vent settings.  Daughter at bedside for SBT, FAILED SBT due to tachypnea and increased WOB. 1/22: Pt remains mechanically intubated on PRVC vent settings PEEP 5/FiO2 30% with increased work of breathing; vbg revealed metabolic acidosis requiring 2 amps of bicarb  1/23: Pt remains mechanically intubated on minimal settings.  Tolerating TF's.  Will continue to optimize  respiratory status today and plan for SBT on 01/24 with daughter at bedside  1/24:  On minimal vents settings, daughter at bedside for WUA and SBT.  Following commands, EXTUBATED. 1/25: Difficultly managing secretions, required REINTUBATION.  Abdominal wound with worsening dehiscence, Surgery holding off on return to OR unless wound separates further given his frailty 1/26: No SBT today due to reintubation yesterday. Will consult ENT for Tracheostomy given 2 failed extubation attempts.  Off vasopressors. 10/01/23- patient for tracheostomy tommorow.  He has failed 2 extubations.  Electrolytes reivewed and repleted.  Mild AKI noted non essential nephrotoxins reviewed and refined. Discussed with surgery, they think that trache is likely going to prolong suffering only.  Surgery Dr Aleen Campi was able to explain with open abdomen that is not amenable to closure wound dehisence and extravasation will occur and cough from trache will make things worse.  We will discuss all this with family and have GOC conversation.   Interim History / Subjective:  As outlined above under significant events   Objective   Blood pressure 121/62, pulse 79, temperature 98.3 F (36.8 C), temperature source Oral, resp. rate 18, height 5' 10.98" (1.803 m), weight 93.4 kg, SpO2 96%.    Vent Mode: PRVC FiO2 (%):  [28 %-35 %] 28 % Set Rate:  [18 bmp] 18 bmp Vt Set:  [500 mL] 500 mL PEEP:  [5 cmH20] 5 cmH20 Plateau Pressure:  [18 cmH20] 18 cmH20   Intake/Output Summary (Last 24 hours) at 10/02/2023 1642 Last data filed at 10/02/2023 0408 Gross per 24 hour  Intake 1327.02 ml  Output 2025 ml  Net -697.98 ml   Filed Weights   09/30/23 0436 10/01/23 0439 10/02/23 0500  Weight: 93.1 kg 93.4 kg 93.4 kg   Examination: General: Acute on chronically-ill appearing male, mechanically intubated and sedated, in NAD HENT: Atraumatic, normocephalic, neck supple, orally intubated Lungs: Mechanical breath sounds throughout, even,  synchronous with the vent Cardiovascular: NSR, s1s2, no m/r/g, 2+ radial/1+ distal pulses, no edema Abdomen: +BS x4, soft, non distended, colostomy and mucous fistula in left upper quadrant dressing dry/intact, stool in bag dressing dry/intact, abdominal binder in place Extremities: Normal bulk and tone, no deformities Neuro: Sedated, withdraws from pain but currently not following commands, pupils PERRL GU: External catheter draining yellow urine   Resolved Hospital Problem list     Assessment & Plan:   #Acute metabolic encephalopathy #Mechanical intubation pain/discomfort  Hx:  Seizure history and is on anti epileptic therapy at home - Maintain a RASS goal of 0 to -1 - PAD protocol to maintain RASS goal: Dilaudid and Versed drips as needed - Avoid sedating medications as able - Continue scheduled baclofen  - Daily wake up assessment - Continue lamictal and keppra - Seizure precautions   #Acute hypoxic & hypercapnic respiratory failure #Large volume aspiration #COPD without acute exacerbation #Influenza A infection (present on admission) Failed 2 Extubation attempts - Full vent support for now  - Continue lung protective strategies - Maintain plateau pressures less than 30 cm H20 - Wean FiO2 & PEEP as tolerated to maintain O2 sats >92% - Follow intermittent CXR & ABG as needed - SBT once respiratory parameters met and mental status permits - Implement VAP Bundle - Scheduled and prn bronchodilator therapy  - IV steroids - ABX as outlined above   #Shock: suspect septic +/- hypovolemic ~ RESOLVED #Chronic HFpEF Echocardiogram 09/22/23: EF 50%; left ventricle demonstrates global hypokinesis; mildly elevated pulmonary artery systolic pressure; large pleural effusion in the left lateral region; trivial mitral valve regurgitation; trivial aortic valve regurgitation  - Continuous telemetry monitoring - Trend lactic acid until normalized - Troponin peaked at 25 - Diuresis as BP  and renal function permits  #AKI on CKD stage II~RESOLVED #Mild Hypernatremia #BPH - Trend BMP - Replace electrolytes as indicated  - Ensure adequate renal perfusion - Avoid nephrotoxic agents as able - Replace electrolytes as indicated ~ Pharmacy following for assistance with electrolyte replacement - Start Free water flushes 100 ml q4h  #Aspiration pneumonia #Pseudomonas pneumonia ~ TREATED  #Influenza Infection (POA)~TREATED - Trend WBC and monitor fever curve - Trend PCT   - Follow cultures as above - Completed 7 day course of ABX as above  #Small bowel obstruction vs ileus #Colostomy irrigation/prolapse #Hx: ogilvie syndrome s/p colostomy - NPO; OGT to LIS - General Surgery following, appreciate input: continue TF's   #Anemia - Trend CBC  - Monitor for s/sx of bleeding  - Transfuse for hgb <7   #Prediabetes - CBG's q4hrs - Target range of 140 to 180 - SSI - Follow ICU Hypo/Hyperglycemia protocol   Patient is critically ill with shock and multiorgan failure.  Prognosis is guarded, high risk for further decompensation, cardiac arrest and death.  Given current critical illness superimposed on multiple chronic comorbidities including colon cancer, along with advanced age, overall long-term prognosis is poor.  Recommend consideration for DNR status.  Palliative care is following for ongoing goals of care discussions.  Patient's family currently requests full code and aggressive measures.   Best Practice (right click and "Reselect all SmartList Selections" daily)   Diet/type: TF's DVT prophylaxis: Lovenox  GI prophylaxis: PPI Lines: Central line and yes and it is still needed  Foley:  N/A  Code Status:  full code Last date of multidisciplinary goals of care discussion [09/30/23]    Labs   CBC: Recent Labs  Lab 09/27/23 0403 09/28/23 0308 09/29/23 0401 09/30/23 0359 10/01/23 0431 10/02/23 0401  WBC 17.9* 14.9* 12.8* 8.5 12.3* 12.6*  NEUTROABS 12.3* 8.4*   --   --   --   --   HGB 7.5* 7.9* 8.4* 8.0* 7.7* 8.2*  HCT 24.7* 26.7* 27.4* 26.7* 27.1* 28.0*  MCV 92.9 95.0 93.8 94.7 97.5 96.6  PLT 279 345 365 326 281 316    Basic Metabolic Panel: Recent Labs  Lab 09/26/23 0535 09/27/23 0403 09/28/23 0308 09/29/23 0401 09/30/23 0351 10/01/23 0431 10/01/23 1515 10/01/23  1814 10/02/23 0401  NA 141 145 146* 145 147* 146*  --   --  146*  K 3.6 4.4 4.7 4.7 5.1 5.2* 4.6 4.6 4.0  CL 111 113* 112* 108 111 113*  --   --  115*  CO2 21* 24 23 24 26 24   --   --  26  GLUCOSE 179* 200* 177* 100* 114* 147*  --   --  143*  BUN 37* 37* 38* 31* 37* 55*  --   --  47*  CREATININE 0.84 1.00 0.93 0.89 1.00 1.26*  --   --  1.04  CALCIUM 8.2* 8.1* 8.2* 8.4* 8.3* 8.1*  --   --  8.2*  MG 2.0 2.1 2.3  --   --  2.4  --   --  2.3  PHOS 2.3* 3.2 3.1 4.7* 5.4* 5.8*  --   --  4.2   GFR: Estimated Creatinine Clearance: 72.7 mL/min (by C-G formula based on SCr of 1.04 mg/dL). Recent Labs  Lab 09/29/23 0401 09/30/23 0359 10/01/23 0431 10/02/23 0401  WBC 12.8* 8.5 12.3* 12.6*    Liver Function Tests: Recent Labs  Lab 09/26/23 0535 09/29/23 0401 09/30/23 0351 10/01/23 0431 10/02/23 0401  ALBUMIN 1.9* 2.4* 2.4* 2.2* 2.5*   No results for input(s): "LIPASE", "AMYLASE" in the last 168 hours. No results for input(s): "AMMONIA" in the last 168 hours.  ABG    Component Value Date/Time   PHART 7.47 (H) 09/29/2023 1729   PCO2ART 38 09/29/2023 1729   PO2ART 62 (L) 09/29/2023 1729   HCO3 27.7 09/29/2023 1729   ACIDBASEDEF 0.8 09/27/2023 0813   O2SAT 73.2 10/02/2023 0438     Coagulation Profile: No results for input(s): "INR", "PROTIME" in the last 168 hours.  Cardiac Enzymes: No results for input(s): "CKTOTAL", "CKMB", "CKMBINDEX", "TROPONINI" in the last 168 hours.  HbA1C: Hgb A1c MFr Bld  Date/Time Value Ref Range Status  05/28/2022 06:17 PM 5.8 (H) 4.8 - 5.6 % Final    Comment:    (NOTE) Pre diabetes:          5.7%-6.4%  Diabetes:               >6.4%  Glycemic control for   <7.0% adults with diabetes   08/09/2019 07:31 AM 5.7 (H) 4.8 - 5.6 % Final    Comment:    (NOTE) Pre diabetes:          5.7%-6.4% Diabetes:              >6.4% Glycemic control for   <7.0% adults with diabetes     CBG: Recent Labs  Lab 10/01/23 2315 10/02/23 0324 10/02/23 0738 10/02/23 1125 10/02/23 1607  GLUCAP 127* 131* 148* 131* 160*    Review of Systems:   Unable to assess due to Intubation/sedation/critical illness  Past Medical History:  He,  has a past medical history of Alcohol abuse, Anemia, Anxiety, Arthritis, Cancer (HCC), COPD (chronic obstructive pulmonary disease) (HCC), Coronary artery disease, Depression, Gout, Hemiplegia and 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 side (HCC), Hypertension, Myocardial infarction (HCC), Neuromuscular disorder (HCC), Ogilvie syndrome, Seizures (HCC), Stroke (HCC), Tremors of nervous system, and Unstable angina (HCC).   Surgical History:   Past Surgical History:  Procedure Laterality Date   CARPAL TUNNEL RELEASE Left 10/19/2015   Procedure: CARPAL TUNNEL RELEASE;  Surgeon: Kennedy Bucker, MD;  Location: [facility] ORS;  Service: Orthopedics;  Laterality: Left;   COLONOSCOPY WITH PROPOFOL N/A 10/26/2021   Procedure: COLONOSCOPY WITH  PROPOFOL;  Surgeon: Regis Bill, MD;  Location: [facility] ENDOSCOPY;  Service: Endoscopy;  Laterality: N/A;   COLONOSCOPY WITH PROPOFOL N/A 06/02/2022   Procedure: COLONOSCOPY WITH PROPOFOL;  Surgeon: Regis Bill, MD;  Location: [facility] ENDOSCOPY;  Service: Endoscopy;  Laterality: N/A;   COLONOSCOPY WITH PROPOFOL N/A 06/06/2022   Procedure: COLONOSCOPY WITH PROPOFOL;  Surgeon: Regis Bill, MD;  Location: [facility] ENDOSCOPY;  Service: Endoscopy;  Laterality: N/A;   COLOSTOMY REVISION N/A 09/11/2023   Procedure: COLOSTOMY REVISION;  Surgeon: Henrene Dodge, MD;  Location: [facility] ORS;  Service: General;  Laterality: N/A;   JOINT REPLACEMENT      left partial hip    LAPAROSCOPIC SIGMOID COLECTOMY  09/12/2010   Lap hand assisted sigmoidectomy, mobilization splenic flexure -- Dr. Freida Busman   LAPAROTOMY N/A 09/11/2023   Procedure: EXPLORATORY LAPAROTOMY;  Surgeon: Henrene Dodge, MD;  Location: [facility] ORS;  Service: General;  Laterality: N/A;   LAPAROTOMY N/A 09/15/2023   Procedure: EXPLORATORY LAPAROTOMY;  Surgeon: Sung Amabile, DO;  Location: [facility] ORS;  Service: General;  Laterality: N/A;   LYSIS OF ADHESION N/A 09/11/2023   Procedure: LYSIS OF ADHESION;  Surgeon: Henrene Dodge, MD;  Location: [facility] ORS;  Service: General;  Laterality: N/A;   THROAT SURGERY  09/05/2011   cancer     Social History:   reports that he has quit smoking. His smoking use included cigarettes. He has never used smokeless tobacco. He reports that he does not currently use alcohol. He reports that he does not use drugs.   Family History:  His family history includes Cancer in his mother; Hypertension in his father; Leukemia in his brother.   Allergies No Known Allergies   Home Medications  Prior to Admission medications   Medication Sig Start Date End Date Taking? Authorizing Provider  aspirin 81 MG chewable tablet Take 81 mg by mouth daily.   Yes [provider]  baclofen (LIORESAL) 5 mg TABS tablet Take 5 mg by mouth 2 (two) times daily.   Yes [provider]  diazePAM, 20 MG Dose, (VALTOCO 20 MG DOSE) 2 x 10 MG/0.1ML LQPK Place 20 mg into the nose as needed (seizure lasting over 2 minutes). 08/23/23  Yes Leroy Sea, MD  ipratropium-albuterol (DUONEB) 0.5-2.5 (3) MG/3ML SOLN Inhale 3 mLs into the lungs every 6 (six) hours as needed. 09/02/23  Yes [provider]  lactulose (CHRONULAC) 10 GM/15ML solution Take 20 g by mouth daily. 08/15/23  Yes [provider]  lamoTRIgine (LAMICTAL) 25 MG tablet Take 50 mg by mouth 2 (two) times daily.   Yes [provider]  levETIRAcetam (KEPPRA) 1000 MG tablet Take 1 tablet  (1,000 mg total) by mouth 2 (two) times daily. 08/23/23  Yes Leroy Sea, MD  Melatonin 10 MG TABS Take 10 mg by mouth at bedtime.   Yes [provider]  nitroGLYCERIN (NITROSTAT) 0.4 MG SL tablet Place 0.4 mg under the tongue 3 (three) times daily as needed for chest pain. Every 5 minutes up to 3 doses 08/24/23  Yes [provider]  pantoprazole (PROTONIX) 40 MG tablet Take 1 tablet (40 mg total) by mouth daily. 08/23/23  Yes Leroy Sea, MD  polyethylene glycol (MIRALAX / GLYCOLAX) 17 g packet Take 17 g by mouth 2 (two) times daily. 08/19/23  Yes Wieting, Richard, MD  pregabalin (LYRICA) 100 MG capsule Take 200 mg by mouth at bedtime.   Yes [provider]  senna (SENOKOT) 8.6 MG TABS tablet Take 2  tablets (17.2 mg total) by mouth at bedtime as needed for mild constipation or moderate constipation. 06/26/23  Yes Sunnie Nielsen, DO  tamsulosin (FLOMAX) 0.4 MG CAPS capsule Take 0.4 mg by mouth every evening.   Yes [provider]  divalproex (DEPAKOTE) 125 MG DR tablet Take 4 tablets (500 mg total) by mouth 2 (two) times daily for 1 day, THEN 4 tablets (500 mg total) daily for 3 days, THEN 2 tablets (250 mg total) daily for 3 days, THEN 1 tablet (125 mg total) daily for 3 days. Patient not taking: Reported on 09/03/2023 08/23/23 09/03/23  Leroy Sea, MD  fluticasone [facility]) 50 MCG/ACT nasal spray Place 2 sprays into both nostrils daily. Patient not taking: Reported on 09/03/2023 05/15/23   [provider]  Mouthwashes (MOUTH RINSE) LIQD solution 15 mLs by Mouth Rinse route every 2 (two) hours. Patient not taking: Reported on 09/03/2023 08/19/23   Alford Highland, MD  pregabalin (LYRICA) 50 MG capsule Take 50 mg capsule twice daily and 100 mg (two capsules) at bedtime Patient not taking: Reported on 09/03/2023 08/23/23   Leroy Sea, MD  tiotropium (SPIRIVA) 18 MCG inhalation capsule Place 18 mcg into inhaler and inhale  daily. Patient not taking: Reported on 09/03/2023    [provider]     Critical care provider statement:   Total critical care time: 34 minutes   Performed by: Karna Christmas MD   Critical care time was exclusive of separately billable procedures and treating other patients.   Critical care was necessary to treat or prevent imminent or life-threatening deterioration.   Critical care was time spent personally by me on the following activities: development of treatment plan with patient and/or surrogate as well as nursing, discussions with consultants, evaluation of patient's response to treatment, examination of patient, obtaining history from patient or surrogate, ordering and performing treatments and interventions, ordering and review of laboratory studies, ordering and review of radiographic studies, pulse oximetry and re-evaluation of patient's condition.    Vida Rigger, M.D.  Pulmonary & Critical Care Medicine

## 2023-10-02 NOTE — Progress Notes (Signed)
10/02/2023  Subjective: Patient is 17 Days Post-Op s/p 2nd exploratory laparotomy for fascial dehiscence, 21 days post-op from initial exploratory laparotomy, LOA, and colostomy revision.  No acute events.  Remains on vent, sedated.  On levophed and milrinone more for optimization rather than any cardiac/hypotension issues.  Was scheduled for tracheostomy today, but TF were not held overnight.  Vital signs: Temp:  [97.8 F (36.6 C)-98.3 F (36.8 C)] 97.8 F (36.6 C) (01/27 1600) Pulse Rate:  [65-84] 71 (01/28 0700) Resp:  [16-20] 19 (01/28 0700) BP: (101-145)/(57-74) 121/63 (01/28 0700) SpO2:  [97 %-100 %] 98 % (01/28 0700) FiO2 (%):  [35 %] 35 % (01/28 0400) Weight:  [93.4 kg] 93.4 kg (01/28 0500)   Intake/Output: 01/27 0701 - 01/28 0700 In: 1327 [I.V.:219; NG/GT:908; IV Piggyback:200] Out: 2525 [Urine:2100; Stool:425] Last BM Date : 09/30/23  Physical Exam: Constitutional:  Sedated, intubated Abdomen:  soft, does not appear distended or tender.  Midline incision with staples and retention sutures.  There is a 3 cm separation of the skin edges, exposing likely small bowel.  Stable from yesterday.  There is now a thin fibrinous layer forming over the bowel.  No bilious staining.  Reapplied xeroform, gauze, and ABD pad dressing.      Labs:  Recent Labs    10/01/23 0431 10/02/23 0401  WBC 12.3* 12.6*  HGB 7.7* 8.2*  HCT 27.1* 28.0*  PLT 281 316   Recent Labs    10/01/23 0431 10/01/23 1515 10/01/23 1814 10/02/23 0401  NA 146*  --   --  146*  K 5.2*   < > 4.6 4.0  CL 113*  --   --  115*  CO2 24  --   --  26  GLUCOSE 147*  --   --  143*  BUN 55*  --   --  47*  CREATININE 1.26*  --   --  1.04  CALCIUM 8.1*  --   --  8.2*   < > = values in this interval not displayed.   No results for input(s): "LABPROT", "INR" in the last 72 hours.  Imaging: No results found.   Assessment/Plan: This is a 73 y.o. male s/p exlap, LOA, and colostomy revision, followed by emergent  exlap for complete fascial dehiscence, now with partial dehiscence and aspiration pneumonia.  --Patient's abdomen with a partial deshiscence now exposing small bowel.  There is a thin layer of fibrinous material forming over the bowel.  Continue xeroform gauze dressing changes.  May be able to granulate over without need for further surgery. --Had long conversation with the patient's daughter yesterday and for now will not do any intervention on his abdomen, pending how he does with trach and any progress he may be able to make. --If not able to do trach today, can resume at previous rate.  Howie Ill, MD [facility]

## 2023-10-03 DIAGNOSIS — Z7189 Other specified counseling: Secondary | ICD-10-CM | POA: Diagnosis not present

## 2023-10-03 DIAGNOSIS — J9601 Acute respiratory failure with hypoxia: Secondary | ICD-10-CM | POA: Diagnosis not present

## 2023-10-03 LAB — COOXEMETRY PANEL
Carboxyhemoglobin: 2.6 % — ABNORMAL HIGH (ref 0.5–1.5)
Methemoglobin: <0.7 % (ref 0.0–1.5)
O2 Saturation: 78.9 %
Total hemoglobin: 8.1 g/dL — ABNORMAL LOW (ref 12.0–16.0)
Total oxygen content: 76.5 %

## 2023-10-03 LAB — RENAL FUNCTION PANEL
Albumin: 2.4 g/dL — ABNORMAL LOW (ref 3.5–5.0)
Anion gap: 14 (ref 5–15)
BUN: 62 mg/dL — ABNORMAL HIGH (ref 8–23)
CO2: 26 mmol/L (ref 22–32)
Calcium: 8.2 mg/dL — ABNORMAL LOW (ref 8.9–10.3)
Chloride: 103 mmol/L (ref 98–111)
Creatinine, Ser: 1.21 mg/dL (ref 0.61–1.24)
GFR, Estimated: >60 mL/min (ref >60)
Glucose, Bld: 157 mg/dL — ABNORMAL HIGH (ref 70–99)
Phosphorus: 3.7 mg/dL (ref 2.5–4.6)
Potassium: 5.2 mmol/L — ABNORMAL HIGH (ref 3.5–5.1)
Sodium: 143 mmol/L (ref 135–145)

## 2023-10-03 LAB — CBC
HCT: 26.1 % — ABNORMAL LOW (ref 39.0–52.0)
Hemoglobin: 7.7 g/dL — ABNORMAL LOW (ref 13.0–17.0)
MCH: 28.6 pg (ref 26.0–34.0)
MCHC: 29.5 g/dL — ABNORMAL LOW (ref 30.0–36.0)
MCV: 97 fL (ref 80.0–100.0)
Platelets: 246 10*3/uL (ref 150–400)
RBC: 2.69 MIL/uL — ABNORMAL LOW (ref 4.22–5.81)
RDW: 16.7 % — ABNORMAL HIGH (ref 11.5–15.5)
WBC: 14.6 10*3/uL — ABNORMAL HIGH (ref 4.0–10.5)
nRBC: 0.1 % (ref 0.0–0.2)

## 2023-10-03 LAB — GLUCOSE, CAPILLARY
Glucose-Capillary: 119 mg/dL — ABNORMAL HIGH (ref 70–99)
Glucose-Capillary: 119 mg/dL — ABNORMAL HIGH (ref 70–99)
Glucose-Capillary: 123 mg/dL — ABNORMAL HIGH (ref 70–99)
Glucose-Capillary: 133 mg/dL — ABNORMAL HIGH (ref 70–99)
Glucose-Capillary: 136 mg/dL — ABNORMAL HIGH (ref 70–99)
Glucose-Capillary: 99 mg/dL (ref 70–99)

## 2023-10-03 LAB — C-REACTIVE PROTEIN: CRP: 8.4 mg/dL — ABNORMAL HIGH (ref <1.0)

## 2023-10-03 LAB — TRIGLYCERIDES: Triglycerides: 82 mg/dL (ref <150)

## 2023-10-03 LAB — MAGNESIUM: Magnesium: 2.4 mg/dL (ref 1.7–2.4)

## 2023-10-03 MED ORDER — FREE WATER
100.0000 mL | Status: DC
  Administered 2023-10-03 – 2023-10-04 (×7): 100 mL

## 2023-10-03 MED ORDER — SODIUM ZIRCONIUM CYCLOSILICATE 5 G PO PACK
5.0000 g | PACK | Freq: Once | ORAL | Status: AC
  Administered 2023-10-03: 5 g
  Filled 2023-10-03: qty 1

## 2023-10-03 MED ORDER — NOREPINEPHRINE 16 MG/250ML-% IV SOLN
0.0000 ug/min | INTRAVENOUS | Status: DC
  Administered 2023-10-03: 2 ug/min via INTRAVENOUS
  Administered 2023-10-06: 5 ug/min via INTRAVENOUS
  Filled 2023-10-03 (×2): qty 250

## 2023-10-03 NOTE — Progress Notes (Signed)
NAME:  Calvin [name], MRN:  [id number], DOB:  11-27-1950, LOS: 30 ADMISSION DATE:  09/03/2023, CONSULTATION DATE:  09/21/2023 REFERRING MD:  Manuela Schwartz, NP, CHIEF COMPLAINT:  Acute Respiratory Distress, Aspiration event   Brief Pt Description / Synopsis:  73 y.o. male admitted with PMHx most significant for colon cancer status post transverse colectomy and colostomy, presented with nausea, vomiting, and shortness of breath.  Was admitted with Sepsis due to Influenza A infection, Aspiration pneumonia, and Small Bowel Obstruction.  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  Course further complicated by Aspiration event requiring intubation and mechanical ventilation on 1/17.  Has been unable to tolerate extubation on 2 occasions due to inability to manage secretions and protect his airway.  Plan for Tracheostomy placement.  History of Present Illness:  Calvin [name] is a 73 y.o. 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   Was admitted by [facility] for further workup and treatment of Was admitted with Sepsis due to Influenza A infection, Aspiration pneumonia, and Small Bowel Obstruction.  Please see "Significant Hospital Events" section below for full detailed hospital course.  10/02/23- patient had good UOP today, tracheostomy was cancelled.  10/03/23- patient with active output from rectal tube, abdomen still open with no plan for surgery.  There are ongoing conversations with family regarding de-escalation of care to potential comfort.  No changes in medical plan for today.    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 side (HCC)    Hypertension    Myocardial infarction (HCC)    Neuromuscular disorder (HCC)    Ogilvie syndrome    Seizures (HCC)    last 6 months ago   Stroke [facility])    multiple  left side weakness   Tremors of nervous system    Unstable angina (HCC)      Micro Data:  12/30: SARS-CoV-2/RSV/Flu PCR>>+ Influenza A 12/30: Blood cultures x2>>negative  12/30: Urine>>Staphylococcus Aureus 12/30: MRSA PCR + 1/17: Tracheal aspirate>>moderate pseudomonas  1/18: MRSA PCR +  Anti-infectives (From admission, onward)    Start     Dose/Rate Route Frequency Ordered Stop   09/23/23 1100  vancomycin (VANCOCIN) IVPB 1000 mg/200 mL premix  Status:  Discontinued        1,000 mg 200 mL/hr over 60 Minutes Intravenous Every 12 hours 09/23/23 0811 09/24/23 1021   09/22/23 2300  vancomycin (VANCOCIN) IVPB 1000 mg/200 mL premix  Status:  Discontinued        1,000 mg 200 mL/hr over 60 Minutes Intravenous Every 24 hours 09/22/23 0819 09/23/23 0811   09/22/23 1000  vancomycin (VANCOREADY) IVPB 1500 mg/300 mL        1,500 mg 150 mL/hr over 120 Minutes Intravenous  Once 09/22/23 0819 09/22/23 1916  09/21/23 0815  piperacillin-tazobactam (ZOSYN) IVPB 3.375 g        3.375 g 12.5 mL/hr over 240 Minutes Intravenous Every 8 hours 09/21/23 0729 09/27/23 2115   09/11/23 2000  cefoTEtan (CEFOTAN) 2 g in sodium chloride 0.9 % 100 mL IVPB        2 g 200 mL/hr over 30 Minutes Intravenous Every 8 hours 09/11/23 1801 09/12/23 1910   09/11/23 0600  cefoTEtan (CEFOTAN) 2 g in sodium chloride 0.9 % 100 mL IVPB        2 g 200 mL/hr over 30 Minutes  Intravenous On call to O.R. 09/10/23 0858 09/12/23 1910   09/04/23 1200  Ampicillin-Sulbactam (UNASYN) 3 g in sodium chloride 0.9 % 100 mL IVPB  Status:  Discontinued        3 g 200 mL/hr over 30 Minutes Intravenous Every 6 hours 09/04/23 1052 09/08/23 1537   09/03/23 1800  ceFEPIme (MAXIPIME) 2 g in sodium chloride 0.9 % 100 mL IVPB  Status:  Discontinued        2 g 200 mL/hr over 30 Minutes Intravenous Every 12 hours 09/03/23 0812 09/04/23 0903   09/03/23 1500  metroNIDAZOLE (FLAGYL) IVPB 500 mg  Status:  Discontinued        500 mg 100 mL/hr over 60 Minutes Intravenous Every 12 hours 09/03/23 0758 09/04/23 0903   09/03/23 1000  oseltamivir (TAMIFLU) capsule 30 mg        30 mg Oral 2 times daily 09/03/23 0757 09/08/23 0959   09/03/23 0315  ceFEPIme (MAXIPIME) 2 g in sodium chloride 0.9 % 100 mL IVPB        2 g 200 mL/hr over 30 Minutes Intravenous  Once 09/03/23 0304 09/03/23 0441   09/03/23 0315  metroNIDAZOLE (FLAGYL) IVPB 500 mg        500 mg 100 mL/hr over 60 Minutes Intravenous  Once 09/03/23 0304 09/03/23 0441   09/03/23 0315  vancomycin (VANCOCIN) IVPB 1000 mg/200 mL premix        1,000 mg 200 mL/hr over 60 Minutes Intravenous  Once 09/03/23 0304 09/03/23 0441      Significant Hospital Events: Including procedures, antibiotic start and stop dates in addition to other pertinent events   12/30: admitted to hospitalist sepsis, AKI, (+)Influenza A, likely aspiration pneumonia, acute hypoxic resp fail, SBO. NG placed, surgical consult.  12/31-01/06: relatively stable, remains SBO w/ NG and on TPN, worsening colostomy prolapse. Anemia w/o overt GIB, holding anti-plt/anti-cogs, GI no plans for endoscopic eval at this time 01/07: exlap, LOA, and colostomy revision  01/08: stable.  01/09: d/c NG 01/10: advancing diet as able. Hgb 6.8 - 1 unit PRBC given  01/11: persistent abd pain and cough, imaging concerning for abdominal surgical wound dehiscence deeper layers, going back to OR.  Continue w/ cough suppression, await over-read but no obvious CT chest findings, suspect post-viral bronchitis  01/12: to OR early AM for closure of wound dehiscence. Postop stable, NG in place. CT chest question pneumonia but pt reports coughing improved, suspect was mostly reflux, will continue to monitor cough. Postop care per general surgery include clamp NG and remain NPO for now   01/13: NG out, CLD 01/14: FLD, continue advancing diet tolerated  1/15: Advanced to dysphagia diet after ST eval.  Discontinue telemetry, palliative care consult, DC central line 1/16: Underwent CT abdomen/pelvis last evening.  Not a candidate for another surgery due to poor functional and nutritional status and multiple recent procedures.  Family deciding on goals  of care/hospice 1/17: Aspirated this morning with development of severe respiratory distress and hypoxia requiring emergent intubation.  Now with developing septic shock.  Pt's daughter requesting transfer to [facility] or [facility]  Dr. Aleen Campi spoke both facilities which are at capacity and not accepting transfers. 1/18: Pt remains mechanically intubated overnight required increased dose of fentanyl and vecuronium x1 dose due to vent dyssynchrony.  Pt currently unable to follow commands RASS goal -4.  Will perform WUA and for possible SBT.  Pt requiring levophed gtt @5  mcg/min and vasopressin 0.03 units/min to maintain map 65 or higher.  Pt extubated to Bipap able to follow commands  1/19: Pt with increased work of breathing due to increased secretions despite scheduled hypertonic saline, Chest PT, and Bipap requiring reintubation  1/20: On minimal vent settings, will keep intubated today as required reintubation yesterday.  Off Vasopressors. KUB with improving ileus, General Surgery ok with starting trickle feeds 1/21: On minimal vent settings.  Daughter at bedside for SBT, FAILED SBT due to tachypnea and increased WOB. 1/22: Pt remains mechanically intubated on PRVC  vent settings PEEP 5/FiO2 30% with increased work of breathing; vbg revealed metabolic acidosis requiring 2 amps of bicarb  1/23: Pt remains mechanically intubated on minimal settings.  Tolerating TF's.  Will continue to optimize respiratory status today and plan for SBT on 01/24 with daughter at bedside  1/24:  On minimal vents settings, daughter at bedside for WUA and SBT.  Following commands, EXTUBATED. 1/25: Difficultly managing secretions, required REINTUBATION.  Abdominal wound with worsening dehiscence, Surgery holding off on return to OR unless wound separates further given his frailty 1/26: No SBT today due to reintubation yesterday. Will consult ENT for Tracheostomy given 2 failed extubation attempts.  Off vasopressors. 10/01/23- patient for tracheostomy tommorow.  He has failed 2 extubations.  Electrolytes reivewed and repleted.  Mild AKI noted non essential nephrotoxins reviewed and refined. Discussed with surgery, they think that trache is likely going to prolong suffering only.  Surgery Dr Aleen Campi was able to explain with open abdomen that is not amenable to closure wound dehisence and extravasation will occur and cough from trache will make things worse.  We will discuss all this with family and have GOC conversation.   Interim History / Subjective:  As outlined above under significant events   Objective   Blood pressure (!) 93/53, pulse 68, temperature 97.8 F (36.6 C), temperature source Axillary, resp. rate 18, height 5' 10.98" (1.803 m), weight 93.4 kg, SpO2 96%.    Vent Mode: PRVC FiO2 (%):  [28 %-35 %] 28 % Set Rate:  [18 bmp] 18 bmp Vt Set:  [500 mL] 500 mL PEEP:  [5 cmH20] 5 cmH20 Plateau Pressure:  [16 cmH20] 16 cmH20   Intake/Output Summary (Last 24 hours) at 10/03/2023 1610 Last data filed at 10/03/2023 9604 Gross per 24 hour  Intake 1741.11 ml  Output 2150 ml  Net -408.89 ml   Filed Weights   09/30/23 0436 10/01/23 0439 10/02/23 0500  Weight: 93.1 kg 93.4 kg 93.4  kg   Examination: General: Acute on chronically-ill appearing male, mechanically intubated and sedated, in NAD HENT: Atraumatic, normocephalic, neck supple, orally intubated Lungs: Mechanical breath sounds throughout, even, synchronous with the vent Cardiovascular: NSR, s1s2, no m/r/g, 2+ radial/1+ distal pulses, no edema Abdomen: +BS x4, soft, non distended, colostomy and mucous fistula in left upper quadrant dressing dry/intact, stool in bag dressing dry/intact, abdominal binder in place Extremities: Normal bulk and tone, no deformities Neuro: Sedated, withdraws  from pain but currently not following commands, pupils PERRL GU: External catheter draining yellow urine   Resolved Hospital Problem list     Assessment & Plan:   #Acute metabolic encephalopathy #Mechanical intubation pain/discomfort  Hx: Seizure history and is on anti epileptic therapy at home - Maintain a RASS goal of 0 to -1 - PAD protocol to maintain RASS goal: Dilaudid and Versed drips as needed - Avoid sedating medications as able - Continue scheduled baclofen  - Daily wake up assessment - Continue lamictal and keppra - Seizure precautions   #Acute hypoxic & hypercapnic respiratory failure #Large volume aspiration #COPD without acute exacerbation #Influenza A infection (present on admission) Failed 2 Extubation attempts - Full vent support for now  - Continue lung protective strategies - Maintain plateau pressures less than 30 cm H20 - Wean FiO2 & PEEP as tolerated to maintain O2 sats >92% - Follow intermittent CXR & ABG as needed - SBT once respiratory parameters met and mental status permits - Implement VAP Bundle - Scheduled and prn bronchodilator therapy  - IV steroids - ABX as outlined above   #Shock: suspect septic +/- hypovolemic ~ RESOLVED #Chronic HFpEF Echocardiogram 09/22/23: EF 50%; left ventricle demonstrates global hypokinesis; mildly elevated pulmonary artery systolic pressure; large  pleural effusion in the left lateral region; trivial mitral valve regurgitation; trivial aortic valve regurgitation  - Continuous telemetry monitoring - Trend lactic acid until normalized - Troponin peaked at 25 - Diuresis as BP and renal function permits  #AKI on CKD stage II~RESOLVED #Mild Hypernatremia #BPH - Trend BMP - Replace electrolytes as indicated  - Ensure adequate renal perfusion - Avoid nephrotoxic agents as able - Replace electrolytes as indicated ~ Pharmacy following for assistance with electrolyte replacement - Start Free water flushes 100 ml q4h  #Aspiration pneumonia #Pseudomonas pneumonia ~ TREATED  #Influenza Infection (POA)~TREATED - Trend WBC and monitor fever curve - Trend PCT   - Follow cultures as above - Completed 7 day course of ABX as above  #Small bowel obstruction vs ileus #Colostomy irrigation/prolapse #Hx: ogilvie syndrome s/p colostomy - NPO; OGT to LIS - General Surgery following, appreciate input: continue TF's   #Anemia - Trend CBC  - Monitor for s/sx of bleeding  - Transfuse for hgb <7   #Prediabetes - CBG's q4hrs - Target range of 140 to 180 - SSI - Follow ICU Hypo/Hyperglycemia protocol   Patient is critically ill with shock and multiorgan failure.  Prognosis is guarded, high risk for further decompensation, cardiac arrest and death.  Given current critical illness superimposed on multiple chronic comorbidities including colon cancer, along with advanced age, overall long-term prognosis is poor.  Recommend consideration for DNR status.  Palliative care is following for ongoing goals of care discussions.  Patient's family currently requests full code and aggressive measures.   Best Practice (right click and "Reselect all SmartList Selections" daily)   Diet/type: TF's DVT prophylaxis: Lovenox  GI prophylaxis: PPI Lines: Central line and yes and it is still needed  Foley:  N/A  Code Status:  full code Last date of  multidisciplinary goals of care discussion [09/30/23]    Labs   CBC: Recent Labs  Lab 09/27/23 0403 09/28/23 0308 09/29/23 0401 09/30/23 0359 10/01/23 0431 10/02/23 0401 10/03/23 0445  WBC 17.9* 14.9* 12.8* 8.5 12.3* 12.6* 14.6*  NEUTROABS 12.3* 8.4*  --   --   --   --   --   HGB 7.5* 7.9* 8.4* 8.0* 7.7* 8.2* 7.7*  HCT 24.7* 26.7*  27.4* 26.7* 27.1* 28.0* 26.1*  MCV 92.9 95.0 93.8 94.7 97.5 96.6 97.0  PLT 279 345 365 326 281 316 246    Basic Metabolic Panel: Recent Labs  Lab 09/27/23 0403 09/28/23 0308 09/29/23 0401 09/30/23 0351 10/01/23 0431 10/01/23 1515 10/01/23 1814 10/02/23 0401 10/03/23 0445  NA 145 146* 145 147* 146*  --   --  146* 143  K 4.4 4.7 4.7 5.1 5.2* 4.6 4.6 4.0 5.2*  CL 113* 112* 108 111 113*  --   --  115* 103  CO2 24 23 24 26 24   --   --  26 26  GLUCOSE 200* 177* 100* 114* 147*  --   --  143* 157*  BUN 37* 38* 31* 37* 55*  --   --  47* 62*  CREATININE 1.00 0.93 0.89 1.00 1.26*  --   --  1.04 1.21  CALCIUM 8.1* 8.2* 8.4* 8.3* 8.1*  --   --  8.2* 8.2*  MG 2.1 2.3  --   --  2.4  --   --  2.3 2.4  PHOS 3.2 3.1 4.7* 5.4* 5.8*  --   --  4.2 3.7   GFR: Estimated Creatinine Clearance: 62.5 mL/min (by C-G formula based on SCr of 1.21 mg/dL). Recent Labs  Lab 09/30/23 0359 10/01/23 0431 10/02/23 0401 10/03/23 0445  WBC 8.5 12.3* 12.6* 14.6*    Liver Function Tests: Recent Labs  Lab 09/29/23 0401 09/30/23 0351 10/01/23 0431 10/02/23 0401 10/03/23 0445  ALBUMIN 2.4* 2.4* 2.2* 2.5* 2.4*   No results for input(s): "LIPASE", "AMYLASE" in the last 168 hours. No results for input(s): "AMMONIA" in the last 168 hours.  ABG    Component Value Date/Time   PHART 7.47 (H) 09/29/2023 1729   PCO2ART 38 09/29/2023 1729   PO2ART 62 (L) 09/29/2023 1729   HCO3 27.7 09/29/2023 1729   ACIDBASEDEF 0.8 09/27/2023 0813   O2SAT 78.9 10/03/2023 0500     Coagulation Profile: No results for input(s): "INR", "PROTIME" in the last 168 hours.  Cardiac  Enzymes: No results for input(s): "CKTOTAL", "CKMB", "CKMBINDEX", "TROPONINI" in the last 168 hours.  HbA1C: Hgb A1c MFr Bld  Date/Time Value Ref Range Status  05/28/2022 06:17 PM 5.8 (H) 4.8 - 5.6 % Final    Comment:    (NOTE) Pre diabetes:          5.7%-6.4%  Diabetes:              >6.4%  Glycemic control for   <7.0% adults with diabetes   08/09/2019 07:31 AM 5.7 (H) 4.8 - 5.6 % Final    Comment:    (NOTE) Pre diabetes:          5.7%-6.4% Diabetes:              >6.4% Glycemic control for   <7.0% adults with diabetes     CBG: Recent Labs  Lab 10/02/23 1125 10/02/23 1607 10/02/23 2004 10/02/23 2319 10/03/23 0348  GLUCAP 131* 160* 83 131* 133*    Review of Systems:   Unable to assess due to Intubation/sedation/critical illness  Past Medical History:  He,  has a past medical history of Alcohol abuse, Anemia, Anxiety, Arthritis, Cancer (HCC), COPD (chronic obstructive pulmonary disease) (HCC), Coronary artery disease, Depression, Gout, Hemiplegia and 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 side (HCC), Hypertension, Myocardial infarction (HCC), Neuromuscular disorder (HCC), Ogilvie syndrome, Seizures (HCC), Stroke (HCC), Tremors of nervous system, and Unstable angina (HCC).   Surgical History:  Past Surgical History:  Procedure Laterality Date   CARPAL TUNNEL RELEASE Left 10/19/2015   Procedure: CARPAL TUNNEL RELEASE;  Surgeon: Kennedy Bucker, MD;  Location: [facility];  Service: Orthopedics;  Laterality: Left;   COLONOSCOPY WITH PROPOFOL N/A 10/26/2021   Procedure: COLONOSCOPY WITH PROPOFOL;  Surgeon: Regis Bill, MD;  Location: [facility];  Service: Endoscopy;  Laterality: N/A;   COLONOSCOPY WITH PROPOFOL N/A 06/02/2022   Procedure: COLONOSCOPY WITH PROPOFOL;  Surgeon: Regis Bill, MD;  Location: [facility];  Service: Endoscopy;  Laterality: N/A;   COLONOSCOPY WITH PROPOFOL N/A 06/06/2022   Procedure: COLONOSCOPY  WITH PROPOFOL;  Surgeon: Regis Bill, MD;  Location: [facility];  Service: Endoscopy;  Laterality: N/A;   COLOSTOMY REVISION N/A 09/11/2023   Procedure: COLOSTOMY REVISION;  Surgeon: Henrene Dodge, MD;  Location: [facility];  Service: General;  Laterality: N/A;   JOINT REPLACEMENT     left partial hip    LAPAROSCOPIC SIGMOID COLECTOMY  09/12/2010   Lap hand assisted sigmoidectomy, mobilization splenic flexure -- Dr. Freida Busman   LAPAROTOMY N/A 09/11/2023   Procedure: EXPLORATORY LAPAROTOMY;  Surgeon: Henrene Dodge, MD;  Location: [facility];  Service: General;  Laterality: N/A;   LAPAROTOMY N/A 09/15/2023   Procedure: EXPLORATORY LAPAROTOMY;  Surgeon: Sung Amabile, DO;  Location: [facility];  Service: General;  Laterality: N/A;   LYSIS OF ADHESION N/A 09/11/2023   Procedure: LYSIS OF ADHESION;  Surgeon: Henrene Dodge, MD;  Location: [facility];  Service: General;  Laterality: N/A;   THROAT SURGERY  09/05/2011   cancer     Social History:   reports that he has quit smoking. His smoking use included cigarettes. He has never used smokeless tobacco. He reports that he does not currently use alcohol. He reports that he does not use drugs.   Family History:  His family history includes Cancer in his mother; Hypertension in his father; Leukemia in his brother.   Allergies No Known Allergies   Home Medications  Prior to Admission medications   Medication Sig Start Date End Date Taking? Authorizing Provider  aspirin 81 MG chewable tablet Take 81 mg by mouth daily.   Yes [provider]  baclofen (LIORESAL) 5 mg TABS tablet Take 5 mg by mouth 2 (two) times daily.   Yes [provider]  diazePAM, 20 MG Dose, (VALTOCO 20 MG DOSE) 2 x 10 MG/0.1ML LQPK Place 20 mg into the nose as needed (seizure lasting over 2 minutes). 08/23/23  Yes Leroy Sea, MD  ipratropium-albuterol (DUONEB) 0.5-2.5 (3) MG/3ML SOLN Inhale 3 mLs into the lungs every 6 (six) hours as needed. 09/02/23  Yes  [provider]  lactulose (CHRONULAC) 10 GM/15ML solution Take 20 g by mouth daily. 08/15/23  Yes [provider]  lamoTRIgine (LAMICTAL) 25 MG tablet Take 50 mg by mouth 2 (two) times daily.   Yes [provider]  levETIRAcetam (KEPPRA) 1000 MG tablet Take 1 tablet (1,000 mg total) by mouth 2 (two) times daily. 08/23/23  Yes Leroy Sea, MD  Melatonin 10 MG TABS Take 10 mg by mouth at bedtime.   Yes [provider]  nitroGLYCERIN (NITROSTAT) 0.4 MG SL tablet Place 0.4 mg under the tongue 3 (three) times daily as needed for chest pain. Every 5 minutes up to 3 doses 08/24/23  Yes [provider]  pantoprazole (PROTONIX) 40 MG tablet Take 1 tablet (40 mg total) by mouth daily. 08/23/23  Yes Leroy Sea, MD  polyethylene glycol [facility] /  GLYCOLAX) 17 g packet Take 17 g by mouth 2 (two) times daily. 08/19/23  Yes Wieting, Richard, MD  pregabalin (LYRICA) 100 MG capsule Take 200 mg by mouth at bedtime.   Yes [provider]  senna (SENOKOT) 8.6 MG TABS tablet Take 2 tablets (17.2 mg total) by mouth at bedtime as needed for mild constipation or moderate constipation. 06/26/23  Yes Sunnie Nielsen, DO  tamsulosin (FLOMAX) 0.4 MG CAPS capsule Take 0.4 mg by mouth every evening.   Yes [provider]  divalproex (DEPAKOTE) 125 MG DR tablet Take 4 tablets (500 mg total) by mouth 2 (two) times daily for 1 day, THEN 4 tablets (500 mg total) daily for 3 days, THEN 2 tablets (250 mg total) daily for 3 days, THEN 1 tablet (125 mg total) daily for 3 days. Patient not taking: Reported on 09/03/2023 08/23/23 09/03/23  Leroy Sea, MD  fluticasone [facility]) 50 MCG/ACT nasal spray Place 2 sprays into both nostrils daily. Patient not taking: Reported on 09/03/2023 05/15/23   [provider]  Mouthwashes (MOUTH RINSE) LIQD solution 15 mLs by Mouth Rinse route every 2 (two) hours. Patient not taking: Reported on 09/03/2023  08/19/23   Alford Highland, MD  pregabalin (LYRICA) 50 MG capsule Take 50 mg capsule twice daily and 100 mg (two capsules) at bedtime Patient not taking: Reported on 09/03/2023 08/23/23   Leroy Sea, MD  tiotropium (SPIRIVA) 18 MCG inhalation capsule Place 18 mcg into inhaler and inhale daily. Patient not taking: Reported on 09/03/2023    [provider]     Critical care provider statement:  Total critical care time: 32 minutes   Performed by: Karna Christmas MD   Critical care time was exclusive of separately billable procedures and treating other patients.   Critical care was necessary to treat or prevent imminent or life-threatening deterioration.   Critical care was time spent personally by me on the following activities: development of treatment plan with patient and/or surrogate as well as nursing, discussions with consultants, evaluation of patient's response to treatment, examination of patient, obtaining history from patient or surrogate, ordering and performing treatments and interventions, ordering and review of laboratory studies, ordering and review of radiographic studies, pulse oximetry and re-evaluation of patient's condition.    Vida Rigger, M.D.  Pulmonary & Critical Care Medicine

## 2023-10-03 NOTE — Progress Notes (Signed)
[facility] SURGICAL PROGRESS NOTE  Hospital Day(s): 30.   Post op day(s): 18 Days Post-Op.   Interval History:  Patient seen and examined Overnight went into Afib; resolved this AM Remains intubated; tracheostomy cancelled yesterday No vasopressor Leukocytosis to 14.6K Hgb to 7.7 sCr - 1.21; UO - 1875 ccs Hyperkalemia to 5.2 Tube feeds being held right now Colostomy with 275 ccs out  Vital signs in last 24 hours: [min-max] current  Temp:  [97.8 F (36.6 C)-98.3 F (36.8 C)] 97.8 F (36.6 C) (01/28 2000) Pulse Rate:  [68-99] 68 (01/29 0700) Resp:  [16-21] 18 (01/29 0700) BP: (92-140)/(53-81) 93/53 (01/29 0700) SpO2:  [93 %-99 %] 96 % (01/29 0802) FiO2 (%):  [28 %-35 %] 28 % (01/29 0802)     Height: 5' 10.98" (180.3 cm) Weight: 93.4 kg BMI (Calculated): 28.73   Intake/Output last 2 shifts:  01/28 0701 - 01/29 0700 In: 1741.1 [I.V.:361.5; NG/GT:941; IV Piggyback:438.6] Out: 2150 [Urine:1875; Stool:275]   Physical Exam:  Constitutional: Intubated; sedated HEENT: NGT in place; tube feedings held Respiratory: Intubated; on ventilator Cardiovascular: regular rate at 70 bpm this AM Gastrointestinal: soft, non-distended, 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, lots of gas and stool in bag. Integumentary: Laparotomy is CDI with staples and retention sutures, skin dehiscence to superior aspect of the wound, this appears to be starting to granulate over, no visible bowel, serous drainage present on dressing; changed  Labs:     Latest Ref Rng & Units 10/03/2023    4:45 AM 10/02/2023    4:01 AM 10/01/2023    4:31 AM  CBC  WBC 4.0 - 10.5 K/uL 14.6  12.6  12.3   Hemoglobin 13.0 - 17.0 g/dL 7.7  8.2  7.7   Hematocrit 39.0 - 52.0 % 26.1  28.0  27.1   Platelets 150 - 400 K/uL 246  316  281       Latest Ref Rng & Units 10/03/2023    4:45 AM 10/02/2023    4:01 AM 10/01/2023    6:14 PM  CMP  Glucose 70 - 99 mg/dL 409  811    BUN 8 - 23 mg/dL 62  47    Creatinine 9.14  - 1.24 mg/dL 7.82  9.56    Sodium 213 - 145 mmol/L 143  146    Potassium 3.5 - 5.1 mmol/L 5.2  4.0  4.6   Chloride 98 - 111 mmol/L 103  115    CO2 22 - 32 mmol/L 26  26    Calcium 8.9 - 10.3 mg/dL 8.2  8.2      Imaging studies: No new pertinent imaging studies   Assessment/Plan:  73 y.o. male with continued respiratory failure requiring intubation  18 Days Post-Op s/p closure of wound dehiscence with rentention sutures s/p initial exploratory laparotomy, LOA, and colostomy revision on 01/07    - Appreciate PCCM support; ventilator management per their service. Tracheostomy cancelled yesterday (01/28).              - Continue tube feedings as tolerated; held this AM             - Monitor abdominal examination             - If sedated/intubated, we can hold on abdominal binder as he is relaxed now             - Again would not recommend any surgical intervention for his dehiscence at this point, which still remains contained and seems  to be healing well. Any surgical intervention would be at high risk of further complications and he currently has very poor reserve. This has been discussed extensively with patient's daughter this week.              - Wound care: Cover with xeroform gauze, superficial gauze, and ABD pad as needed. Change daily and as needed - anticipate heavy serous output from this, change as needed              - Appreciate WOC RN care with ostomy             - Retention sutures will need to be maintained for 4-6 weeks from surgery - Monitor colostomy output; record - Pain control prn; antiemetics prn - Further management per primary service; we will follow    All of the above findings and recommendations were discussed with the medical team.   -- Lynden Oxford, PA-C [facility] 10/03/2023, 8:36 AM M-F: 7am - 4pm

## 2023-10-03 NOTE — Consult Note (Addendum)
WOC Nurse ostomy follow up Surgical team following for assessment and plan of care for abd wound.  Stoma type/location: LLQ colostomy/mucus fistula  Stomal assessment/size: approximately 2 3/4 inches, edematous, above skin level  Peristomal assessment: intact  Treatment options for stomal/peristomal skin: applied 4 inch 2-piece pouching system. 3 sets of supplies ordered to the bedside for staff nurse use. Use pouching system: [employer] [place] # [phone] Output  mod amt liquid brown effluent  Pt is critically ill on the ventilator and unable to learn. He was in a SNF prior to admission and had total assistance with activities of daily living.  Enrolled patient in [place] Secure Start Discharge program: yes, previously  WOC team will continue to follow every 7 to 10 days. Thank-you,  Cammie Mcgee MSN, RN, CWOCN, [place], CNS [phone]

## 2023-10-03 NOTE — Plan of Care (Signed)
  Problem: Clinical Measurements: Goal: Will remain free from infection Outcome: Not Progressing Goal: Diagnostic test results will improve Outcome: Not Progressing   Problem: Elimination: Goal: Will not experience complications related to bowel motility Outcome: Not Progressing Goal: Will not experience complications related to urinary retention Outcome: Not Progressing   Problem: Pain Managment: Goal: General experience of comfort will improve and/or be controlled Outcome: Not Progressing   Problem: Safety: Goal: Ability to remain free from injury will improve Outcome: Not Progressing   Problem: Skin Integrity: Goal: Risk for impaired skin integrity will decrease Outcome: Not Progressing

## 2023-10-03 NOTE — Progress Notes (Signed)
Daily Progress Note   [name]       Date: 10/03/2023 DOB: December 20, 1950  Age: 73 y.o. MRN#: [id number] Attending Physician: Vida Rigger, MD Primary Care Physician: Keane Police, MD Admit Date: 09/03/2023  Reason for Consultation/Follow-up: Establishing goals of care  Subjective: Notes and labs reviewed.  Patient is resting in bed on ventilator support with wound care nurse at the bedside.  Family at bedside.    Called to speak with daughter Calvin [name].  Calvin [name] states she has seen his abdomen, and "it does not look that bad" to her.  Questions answered regarding notes that were left surrounding his aspiration event.   She requested that I hold, and the line disconnected.  Called her back but phone was not answered.   Length of Stay: 30  Current Medications: Scheduled Meds:   Chlorhexidine Gluconate Cloth  6 each Topical Daily   docusate  100 mg Per Tube BID   free water  100 mL Per Tube Q4H   furosemide  20 mg Intravenous Daily   heparin injection (subcutaneous)  5,000 Units Subcutaneous Q8H   insulin aspart  0-15 Units Subcutaneous Q4H   ipratropium-albuterol  3 mL Inhalation TID   lamoTRIgine  50 mg Per Tube BID   nutrition supplement (JUVEN)  1 packet Per Tube BID BM   mouth rinse  15 mL Mouth Rinse Q2H   oxyCODONE  5 mg Per Tube Q6H   pantoprazole (PROTONIX) IV  40 mg Intravenous Q12H   polyethylene glycol  17 g Per Tube Daily   pregabalin  100 mg Per Tube Daily   pregabalin  200 mg Per Tube QHS   sodium chloride flush  10-40 mL Intracatheter Q12H   sodium chloride flush  3-10 mL Intravenous Q12H    Continuous Infusions:  feeding supplement (PIVOT 1.5 CAL) 60 mL/hr at 10/03/23 4098   HYDROmorphone 3 mg/hr (10/03/23 1191)   levETIRAcetam  1,000 mg (10/03/23 0850)   midazolam 1 mg/hr (10/03/23 4782)    PRN Meds: docusate, fentaNYL (SUBLIMAZE) injection, glycopyrrolate, HYDROmorphone, ipratropium-albuterol, mouth rinse, polyvinyl alcohol, senna, sodium chloride flush, sodium chloride flush  Physical Exam Constitutional:      Comments: Eyes closed  Pulmonary:     Comments: On ventilator            Vital  Signs: BP 101/64   Pulse 78   Temp 99.5 F (37.5 C) (Oral)   Resp 18   Ht 5' 10.98" (1.803 m)   Wt 93.4 kg   SpO2 93%   BMI 28.73 kg/m  SpO2: SpO2: 93 % O2 Device: O2 Device: Ventilator O2 Flow Rate: O2 Flow Rate (L/min): 2 L/min  Intake/output summary:  Intake/Output Summary (Last 24 hours) at 10/03/2023 1208 Last data filed at 10/03/2023 6213 Gross per 24 hour  Intake 1741.11 ml  Output 1550 ml  Net 191.11 ml   LBM: Last BM Date : 10/02/23 Baseline Weight: Weight: 97.8 kg Most recent weight: Weight: 93.4 kg   Patient Active Problem List   Diagnosis Date Noted   Aspiration into airway 09/21/2023   Small bowel obstruction due to adhesions (HCC) 09/11/2023   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   Cancer of sigmoid (HCC) 09/04/2023   Gout 09/04/2023   Acute respiratory failure with hypoxia (HCC) 09/03/2023   Acute metabolic encephalopathy 08/19/2023   History of CVA (cerebrovascular accident) 08/19/2023   Thrombocytopenia (HCC) 08/19/2023   Hypotension 07/15/2023   Small bowel obstruction (HCC) 07/15/2023   Hemiplegia and 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 side (HCC)    Temporal pain 06/18/2022   Acute kidney injury superimposed on CKD (HCC) 06/17/2022   Dysphagia 06/07/2022   Respiratory distress 06/02/2022   Neuropathy 06/02/2022   Paroxysmal atrial flutter (HCC) 06/01/2022   Aspiration pneumonia (HCC) 06/01/2022   Cervical spinal stenosis 05/31/2022   Chronic diastolic CHF (congestive heart failure) (HCC) 05/28/2022   SBO, recurrent (small bowel obstruction)  (HCC)    Abdominal distention    HLD (hyperlipidemia) 10/16/2021   Nausea vomiting and diarrhea 10/16/2021   Sepsis (HCC) 10/16/2021   Tobacco abuse 10/16/2021   Muscle twitching 08/14/2019   UTI (urinary tract infection) 08/03/2019   Ileus (HCC) 08/03/2019   Hypokalemia 08/03/2019   QT prolongation 08/03/2019   Ogilvie syndrome    Acute abdominal pain 04/03/2019   Left-sided weakness 09/30/2017   Seizure (HCC) 09/30/2017   Essential hypertension 09/30/2017   CAD (coronary artery disease) 09/30/2017   COPD (chronic obstructive pulmonary disease) (HCC) 09/30/2017   Depression with anxiety 09/30/2017    Palliative Care Assessment & Plan     Recommendations/Plan: Called to speak with Calvin [name].  Questions answered as able. I was placed on hold and the line was disconnected.  I attempted to call back and the call was not answered.  With the remaining by the phone for a few minutes, there was no call back received to the phone that I made the call from.  Code Status:    Code Status Orders  (From admission, onward)           Start     Ordered   09/03/23 0754  Full code  Continuous       Question:  By:  Answer:  Consent: discussion documented in EHR   09/03/23 0755           Code Status History     Date Active Date Inactive Code Status Order ID Comments User Context   08/19/2023 1314 08/23/2023 1838 Full Code [id number]  Alan Mulder, MD Inpatient   08/18/2023 1448 08/19/2023 1154 Full Code [id number]  Emeline General, MD ED   07/18/2023 1748 07/24/2023 1712 Full Code [id number]  Kathrynn Running, MD Inpatient   07/15/2023 0538 07/18/2023 1551 Full Code [id number]  Andris Baumann, MD ED   06/22/2023 [phone] 06/27/2023 [phone]  Full Code [id number]  Emeline General, MD ED   05/28/2022 1622 06/30/2022 2326 Full Code [id number]  Lorretta Harp, MD ED   10/23/2021 2158 11/02/2021 2029 Full Code [id number]  Lajoyce Corners, NP Inpatient   10/16/2021 1144 10/23/2021 2158 DNR [id number]  Lorretta Harp, MD ED   10/02/2021 0731 10/02/2021 1641 DNR [id number]  Loleta Rose, MD ED   08/03/2019 2226 08/14/2019 2350 Full Code [id number]  Lurene Shadow, MD Inpatient   04/03/2019 1711 04/08/2019 1731 Full Code [id number]  Auburn Bilberry, MD Inpatient   04/03/2019 1627 04/03/2019 1710 DNR [id number]  Auburn Bilberry, MD ED   09/30/2017 2301 10/02/2017 2039 Full Code [id number]  Oralia Manis, MD Inpatient     Thank you for allowing the Palliative Medicine Team to assist in the care of this patient.  Morton Stall, NP  Please contact Palliative Medicine Team phone at [phone] for questions and concerns.

## 2023-10-04 DIAGNOSIS — Z7189 Other specified counseling: Secondary | ICD-10-CM | POA: Diagnosis not present

## 2023-10-04 DIAGNOSIS — J9601 Acute respiratory failure with hypoxia: Secondary | ICD-10-CM | POA: Diagnosis not present

## 2023-10-04 LAB — CBC
HCT: 26.7 % — ABNORMAL LOW (ref 39.0–52.0)
Hemoglobin: 7.8 g/dL — ABNORMAL LOW (ref 13.0–17.0)
MCH: 28.7 pg (ref 26.0–34.0)
MCHC: 29.2 g/dL — ABNORMAL LOW (ref 30.0–36.0)
MCV: 98.2 fL (ref 80.0–100.0)
Platelets: 261 10*3/uL (ref 150–400)
RBC: 2.72 MIL/uL — ABNORMAL LOW (ref 4.22–5.81)
RDW: 17.2 % — ABNORMAL HIGH (ref 11.5–15.5)
WBC: 16.9 10*3/uL — ABNORMAL HIGH (ref 4.0–10.5)
nRBC: 0.2 % (ref 0.0–0.2)

## 2023-10-04 LAB — THYROID PANEL WITH TSH
Free Thyroxine Index: 3.2 (ref 1.2–4.9)
T3 Uptake Ratio: 31 % (ref 24–39)
T4, Total: 10.3 ug/dL (ref 4.5–12.0)
TSH: 0.585 u[IU]/mL (ref 0.450–4.500)

## 2023-10-04 LAB — GLUCOSE, CAPILLARY
Glucose-Capillary: 130 mg/dL — ABNORMAL HIGH (ref 70–99)
Glucose-Capillary: 125 mg/dL — ABNORMAL HIGH (ref 70–99)
Glucose-Capillary: 139 mg/dL — ABNORMAL HIGH (ref 70–99)
Glucose-Capillary: 139 mg/dL — ABNORMAL HIGH (ref 70–99)

## 2023-10-04 LAB — RENAL FUNCTION PANEL
Albumin: 2.3 g/dL — ABNORMAL LOW (ref 3.5–5.0)
Anion gap: 10 (ref 5–15)
BUN: 82 mg/dL — ABNORMAL HIGH (ref 8–23)
CO2: 25 mmol/L (ref 22–32)
Calcium: 8 mg/dL — ABNORMAL LOW (ref 8.9–10.3)
Chloride: 109 mmol/L (ref 98–111)
Creatinine, Ser: 1.39 mg/dL — ABNORMAL HIGH (ref 0.61–1.24)
GFR, Estimated: 53 mL/min — ABNORMAL LOW (ref >60)
Glucose, Bld: 139 mg/dL — ABNORMAL HIGH (ref 70–99)
Phosphorus: 3.1 mg/dL (ref 2.5–4.6)
Potassium: 4.4 mmol/L (ref 3.5–5.1)
Sodium: 144 mmol/L (ref 135–145)

## 2023-10-04 LAB — MAGNESIUM: Magnesium: 2.5 mg/dL — ABNORMAL HIGH (ref 1.7–2.4)

## 2023-10-04 LAB — C-REACTIVE PROTEIN: CRP: 10 mg/dL — ABNORMAL HIGH (ref <1.0)

## 2023-10-04 MED ORDER — FREE WATER
30.0000 mL | Status: DC
  Administered 2023-10-04 – 2023-10-06 (×11): 30 mL

## 2023-10-04 NOTE — Plan of Care (Signed)
  Problem: Elimination: Goal: Will not experience complications related to bowel motility Outcome: Progressing   Problem: Pain Managment: Goal: General experience of comfort will improve and/or be controlled Outcome: Progressing   Problem: Safety: Goal: Ability to remain free from injury will improve Outcome: Progressing   Problem: Elimination: Goal: Will not experience complications related to urinary retention Outcome: Not Progressing

## 2023-10-04 NOTE — Progress Notes (Signed)
[facility] SURGICAL PROGRESS NOTE  Hospital Day(s): 31.   Post op day(s): 19 Days Post-Op.   Interval History:  Patient seen and examined Remains intubated 5 mch Levophed this AM Leukocytosis to 16.9K Hgb to 7.8; stable sCr - 1.39; UO - 2075 ccs Colostomy with 375 ccs out  Vital signs in last 24 hours: [min-max] current  Temp:  [98.1 F (36.7 C)-99.8 F (37.7 C)] 98.1 F (36.7 C) (01/30 0800) Pulse Rate:  [71-97] 84 (01/30 0800) Resp:  [16-24] 18 (01/30 0800) BP: (77-121)/(47-70) 121/60 (01/30 0800) SpO2:  [89 %-97 %] 94 % (01/30 0800) FiO2 (%):  [28 %] 28 % (01/30 0800) Weight:  [94.3 kg] 94.3 kg (01/30 0426)     Height: 5' 10.98" (180.3 cm) Weight: 94.3 kg BMI (Calculated): 29.01   Intake/Output last 2 shifts:  01/29 0701 - 01/30 0700 In: 1754.8 [I.V.:146.8; NG/GT:1408; IV Piggyback:200] Out: 2450 [Urine:2075; Stool:375]   Physical Exam:  Constitutional: Intubated; sedated HEENT: NGT in place Respiratory: Intubated; on ventilator Cardiovascular: regular rate at 70 bpm this AM Gastrointestinal: soft, non-distended, 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 Integumentary: Laparotomy is CDI with staples and retention sutures, skin dehiscence to superior aspect of the wound, this appears to be starting to granulate over, no visible bowel, serous drainage present on dressing; changed  Midline Wound (10/04/2023):     Labs:     Latest Ref Rng & Units 10/04/2023    3:54 AM 10/03/2023    4:45 AM 10/02/2023    4:01 AM  CBC  WBC 4.0 - 10.5 K/uL 16.9  14.6  12.6   Hemoglobin 13.0 - 17.0 g/dL 7.8  7.7  8.2   Hematocrit 39.0 - 52.0 % 26.7  26.1  28.0   Platelets 150 - 400 K/uL 261  246  316       Latest Ref Rng & Units 10/04/2023    3:54 AM 10/03/2023    4:45 AM 10/02/2023    4:01 AM  CMP  Glucose 70 - 99 mg/dL 454  098  119   BUN 8 - 23 mg/dL 82  62  47   Creatinine 0.61 - 1.24 mg/dL 1.47  8.29  5.62   Sodium 135 - 145 mmol/L  144  143  146   Potassium 3.5 - 5.1 mmol/L 4.4  5.2  4.0   Chloride 98 - 111 mmol/L 109  103  115   CO2 22 - 32 mmol/L 25  26  26    Calcium 8.9 - 10.3 mg/dL 8.0  8.2  8.2     Imaging studies: No new pertinent imaging studies   Assessment/Plan:  73 y.o. male with continued respiratory failure requiring intubation  19 Days Post-Op s/p closure of wound dehiscence with rentention sutures s/p initial exploratory laparotomy, LOA, and colostomy revision on 01/07    - Appreciate PCCM support; ventilator/vasopressor management per their service. Tracheostomy cancelled 01/28             - Continue tube feedings as tolerated             - Monitor abdominal examination             - If sedated/intubated, we can hold on abdominal binder as he is relaxed now             - Again would not recommend any surgical intervention for his dehiscence at this point, which still remains contained and seems to be  healing well. Any surgical intervention would be at high risk of further complications and he currently has very poor reserve. This has been discussed extensively with patient's daughter this week.              - Wound care: Cover with xeroform gauze, superficial gauze, and ABD pad as needed. Change daily and as needed - anticipate heavy serous output from this, change as needed              - Appreciate WOC RN care with ostomy             - Retention sutures will need to be maintained for 4-6 weeks from surgery - Monitor colostomy output; record - Pain control prn; antiemetics prn - Further management per primary service; we will follow    All of the above findings and recommendations were discussed with the medical team.   -- Lynden Oxford, PA-C [facility] 10/04/2023, 8:46 AM M-F: 7am - 4pm

## 2023-10-04 NOTE — Progress Notes (Signed)
Daily Progress Note   [name]       Date: 10/04/2023 DOB: [birth year]  Age: 73 y.o. MRN#: [id number] Attending Physician: Vida Rigger, MD Primary Care Physician: Keane Police, MD Admit Date: 09/03/2023  Reason for Consultation/Follow-up: Establishing goals of care  Subjective: Notes and labs reviewed. Decisions remain unclear. Spoke with CCM. Plans in place to try to have all family members together and all disciplines together at the same time for GOC.  Length of Stay: 31  Current Medications: Scheduled Meds:   Chlorhexidine Gluconate Cloth  6 each Topical Daily   docusate  100 mg Per Tube BID   free water  30 mL Per Tube Q4H   furosemide  20 mg Intravenous Daily   heparin injection (subcutaneous)  5,000 Units Subcutaneous Q8H   insulin aspart  0-15 Units Subcutaneous Q4H   ipratropium-albuterol  3 mL Inhalation TID   lamoTRIgine  50 mg Per Tube BID   nutrition supplement (JUVEN)  1 packet Per Tube BID BM   mouth rinse  15 mL Mouth Rinse Q2H   oxyCODONE  5 mg Per Tube Q6H   pantoprazole (PROTONIX) IV  40 mg Intravenous Q12H   polyethylene glycol  17 g Per Tube Daily   pregabalin  100 mg Per Tube Daily   pregabalin  200 mg Per Tube QHS   sodium chloride flush  10-40 mL Intracatheter Q12H   sodium chloride flush  3-10 mL Intravenous Q12H    Continuous Infusions:  feeding supplement (PIVOT 1.5 CAL) 60 mL/hr at 10/04/23 1200   HYDROmorphone 3 mg/hr (10/04/23 1333)   levETIRAcetam Stopped (10/04/23 1031)   midazolam 1 mg/hr (10/04/23 1200)   norepinephrine (LEVOPHED) Adult infusion 5 mcg/min (10/04/23 1200)    PRN Meds: docusate, fentaNYL (SUBLIMAZE) injection, glycopyrrolate, HYDROmorphone, ipratropium-albuterol, mouth rinse, polyvinyl  alcohol, senna, sodium chloride flush, sodium chloride flush  Physical Exam Constitutional:      Comments: Eyes closed.   Pulmonary:     Comments: On vent            Vital Signs: BP (!) 105/50   Pulse 79   Temp 100.1 F (37.8 C) (Oral)   Resp 18   Ht 5' 10.98" (1.803 m)   Wt 94.3 kg   SpO2 95%   BMI 29.01 kg/m  SpO2: SpO2: 95 % O2 Device: O2 Device: Ventilator O2 Flow Rate: O2 Flow Rate (L/min): 2 L/min  Intake/output summary:  Intake/Output Summary (Last 24 hours) at 10/04/2023 1521 Last data filed at 10/04/2023 1200 Gross per 24 hour  Intake 2261.35 ml  Output 2350 ml  Net -88.65 ml   LBM: Last BM Date : 10/04/23 Baseline Weight: Weight: 97.8 kg Most recent weight: Weight: 94.3 kg    Patient Active Problem List   Diagnosis Date Noted   Aspiration into airway 09/21/2023   Small bowel obstruction due to adhesions (HCC) 09/11/2023   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   Cancer of sigmoid (HCC) 09/04/2023   Gout 09/04/2023   Acute respiratory failure with hypoxia (HCC) 09/03/2023   Acute metabolic encephalopathy 08/19/2023   History of CVA (cerebrovascular accident) 08/19/2023   Thrombocytopenia (HCC) 08/19/2023   Hypotension 07/15/2023   Small bowel obstruction (HCC) 07/15/2023   Hemiplegia and 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 side (HCC)    Temporal pain 06/18/2022   Acute kidney injury superimposed on CKD (HCC) 06/17/2022   Dysphagia 06/07/2022   Respiratory distress 06/02/2022   Neuropathy 06/02/2022   Paroxysmal atrial flutter (HCC) 06/01/2022   Aspiration pneumonia (HCC) 06/01/2022   Cervical spinal stenosis 05/31/2022   Chronic diastolic CHF (congestive heart failure) (HCC) 05/28/2022   SBO, recurrent (small bowel obstruction) (HCC)    Abdominal distention    HLD (hyperlipidemia) 10/16/2021   Nausea vomiting and diarrhea 10/16/2021   Sepsis (HCC) 10/16/2021   Tobacco abuse 10/16/2021   Muscle twitching  08/14/2019   UTI (urinary tract infection) 08/03/2019   Ileus (HCC) 08/03/2019   Hypokalemia 08/03/2019   QT prolongation 08/03/2019   Ogilvie syndrome    Acute abdominal pain 04/03/2019   Left-sided weakness 09/30/2017   Seizure (HCC) 09/30/2017   Essential hypertension 09/30/2017   CAD (coronary artery disease) 09/30/2017   COPD (chronic obstructive pulmonary disease) (HCC) 09/30/2017   Depression with anxiety 09/30/2017    Palliative Care Assessment & Plan    Recommendations/Plan: CCM working on multi-disciplinary meeting with all family members.  Code Status:    Code Status Orders  (From admission, onward)           Start     Ordered   09/03/23 0754  Full code  Continuous       Question:  By:  Answer:  Consent: discussion documented in EHR   09/03/23 0755           Code Status History     Date Active Date Inactive Code Status Order ID Comments User Context   08/19/2023 1314 08/23/2023 1838 Full Code [id number]  Alan Mulder, MD Inpatient   08/18/2023 1448 08/19/2023 1154 Full Code [id number]  Emeline General, MD ED   07/18/2023 1748 07/24/2023 1712 Full Code [id number]  Kathrynn Running, MD Inpatient   07/15/2023 0538 07/18/2023 1551 Full Code [id number]  Andris Baumann, MD ED   06/22/2023 0939 06/27/2023 0108 Full Code [id number]  Emeline General, MD ED   05/28/2022 1622 06/30/2022 2326 Full Code [id number]  Lorretta Harp, MD ED   10/23/2021 2158 11/02/2021 2029 Full Code [id number]  Lajoyce Corners, NP Inpatient   10/16/2021 1144 10/23/2021 2158 DNR [id number]  Lorretta Harp, MD ED   10/02/2021 0731 10/02/2021 1641 DNR [id number]  Loleta Rose, MD ED   08/03/2019 2226 08/14/2019 2350 Full Code [id number]  Lurene Shadow, MD Inpatient   04/03/2019 1711 04/08/2019 1731 Full Code [id number]  Auburn Bilberry,  MD Inpatient   04/03/2019 1627 04/03/2019 1710 DNR [id number]  Auburn Bilberry, MD ED   09/30/2017 2301 10/02/2017 2039 Full Code [id number]  Oralia Manis, MD Inpatient      Care  plan was discussed with CCM  Thank you for allowing the Palliative Medicine Team to assist in the care of this patient.   Morton Stall, NP  Please contact Palliative Medicine Team phone at [phone] for questions and concerns.

## 2023-10-04 NOTE — Progress Notes (Signed)
NAME:  Calvin [name], MRN:  [id number], DOB:  1950-12-31, LOS: 31 ADMISSION DATE:  09/03/2023, CONSULTATION DATE:  09/21/2023 REFERRING MD:  Manuela Schwartz, NP, CHIEF COMPLAINT:  Acute Respiratory Distress, Aspiration event   Brief Pt Description / Synopsis:  73 y.o. male admitted with PMHx most significant for colon cancer status post transverse colectomy and colostomy, presented with nausea, vomiting, and shortness of breath.  Was admitted with Sepsis due to Influenza A infection, Aspiration pneumonia, and Small Bowel Obstruction.  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  Course further complicated by Aspiration event requiring intubation and mechanical ventilation on 1/17.  Has been unable to tolerate extubation on 2 occasions due to inability to manage secretions and protect his airway.  Plan for Tracheostomy placement.  History of Present Illness:  Calvin [name] is a 73 y.o. 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   Was admitted by [facility] for further workup and treatment of Was admitted with Sepsis due to Influenza A infection, Aspiration pneumonia, and Small Bowel Obstruction.  Please see "Significant Hospital Events" section below for full detailed hospital course.  10/02/23- patient had good UOP today, tracheostomy was cancelled.  10/03/23- patient with active output from rectal tube, abdomen still open with no plan for surgery.  There are ongoing conversations with family regarding de-escalation of care to potential comfort.  No changes in medical plan for today.   10/04/23-Patient had Bipap today, repeat aBG shows markedly imporved CO2 and Ph.  His mentation is more lucid and appropriate.   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 side (HCC)    Hypertension    Myocardial infarction (HCC)    Neuromuscular disorder (HCC)    Ogilvie syndrome    Seizures (HCC)    last 6 months ago   Stroke [facility]    multiple  left side weakness   Tremors of nervous system    Unstable angina (HCC)      Micro Data:  12/30: SARS-CoV-2/RSV/Flu PCR>>+ Influenza A 12/30: Blood cultures x2>>negative  12/30: Urine>>Staphylococcus Aureus 12/30: MRSA PCR + 1/17: Tracheal aspirate>>moderate pseudomonas  1/18: MRSA PCR +  Anti-infectives (From admission, onward)    Start     Dose/Rate Route Frequency Ordered Stop   09/23/23 1100  vancomycin (VANCOCIN) IVPB 1000 mg/200 mL premix  Status:  Discontinued        1,000 mg 200 mL/hr over 60 Minutes Intravenous Every 12 hours 09/23/23 0811 09/24/23 1021   09/22/23 2300  vancomycin (VANCOCIN) IVPB 1000 mg/200 mL premix  Status:  Discontinued        1,000 mg 200 mL/hr over 60 Minutes Intravenous Every 24 hours 09/22/23 0819 09/23/23 0811   09/22/23 1000  vancomycin (VANCOREADY) IVPB 1500 mg/300 mL  1,500 mg 150 mL/hr over 120 Minutes Intravenous  Once 09/22/23 0819 09/22/23 1916   09/21/23 0815  piperacillin-tazobactam (ZOSYN) IVPB 3.375 g        3.375 g 12.5 mL/hr over 240 Minutes Intravenous Every 8 hours 09/21/23 0729 09/27/23 2115   09/11/23 2000  cefoTEtan (CEFOTAN) 2 g in sodium chloride 0.9 % 100 mL IVPB        2 g 200 mL/hr over 30 Minutes Intravenous Every 8 hours 09/11/23 1801 09/12/23  1910   09/11/23 0600  cefoTEtan (CEFOTAN) 2 g in sodium chloride 0.9 % 100 mL IVPB        2 g 200 mL/hr over 30 Minutes Intravenous On call to O.R. 09/10/23 0858 09/12/23 1910   09/04/23 1200  Ampicillin-Sulbactam (UNASYN) 3 g in sodium chloride 0.9 % 100 mL IVPB  Status:  Discontinued        3 g 200 mL/hr over 30 Minutes Intravenous Every 6 hours 09/04/23 1052 09/08/23 1537   09/03/23 1800  ceFEPIme (MAXIPIME) 2 g in sodium chloride 0.9 % 100 mL IVPB  Status:  Discontinued        2 g 200 mL/hr over 30 Minutes Intravenous Every 12 hours 09/03/23 0812 09/04/23 0903   09/03/23 1500  metroNIDAZOLE (FLAGYL) IVPB 500 mg  Status:  Discontinued        500 mg 100 mL/hr over 60 Minutes Intravenous Every 12 hours 09/03/23 0758 09/04/23 0903   09/03/23 1000  oseltamivir (TAMIFLU) capsule 30 mg        30 mg Oral 2 times daily 09/03/23 0757 09/08/23 0959   09/03/23 0315  ceFEPIme (MAXIPIME) 2 g in sodium chloride 0.9 % 100 mL IVPB        2 g 200 mL/hr over 30 Minutes Intravenous  Once 09/03/23 0304 09/03/23 0441   09/03/23 0315  metroNIDAZOLE (FLAGYL) IVPB 500 mg        500 mg 100 mL/hr over 60 Minutes Intravenous  Once 09/03/23 0304 09/03/23 0441   09/03/23 0315  vancomycin (VANCOCIN) IVPB 1000 mg/200 mL premix        1,000 mg 200 mL/hr over 60 Minutes Intravenous  Once 09/03/23 0304 09/03/23 0441      Significant Hospital Events: Including procedures, antibiotic start and stop dates in addition to other pertinent events   12/30: admitted to hospitalist sepsis, AKI, (+)Influenza A, likely aspiration pneumonia, acute hypoxic resp fail, SBO. NG placed, surgical consult.  12/31-01/06: relatively stable, remains SBO w/ NG and on TPN, worsening colostomy prolapse. Anemia w/o overt GIB, holding anti-plt/anti-cogs, GI no plans for endoscopic eval at this time 01/07: exlap, LOA, and colostomy revision  01/08: stable.  01/09: d/c NG 01/10: advancing diet as able. Hgb 6.8 - 1 unit PRBC given  01/11:  persistent abd pain and cough, imaging concerning for abdominal surgical wound dehiscence deeper layers, going back to OR. Continue w/ cough suppression, await over-read but no obvious CT chest findings, suspect post-viral bronchitis  01/12: to OR early AM for closure of wound dehiscence. Postop stable, NG in place. CT chest question pneumonia but pt reports coughing improved, suspect was mostly reflux, will continue to monitor cough. Postop care per general surgery include clamp NG and remain NPO for now   01/13: NG out, CLD 01/14: FLD, continue advancing diet tolerated  1/15: Advanced to dysphagia diet after ST eval.  Discontinue telemetry, palliative care consult, DC central line 1/16: Underwent CT abdomen/pelvis last evening.  Not a candidate for another surgery  due to poor functional and nutritional status and multiple recent procedures.  Family deciding on goals of care/hospice 1/17: Aspirated this morning with development of severe respiratory distress and hypoxia requiring emergent intubation.  Now with developing septic shock.  Pt's daughter requesting transfer to [facility] or [facility]  Dr. Aleen Campi spoke both facilities which are at capacity and not accepting transfers. 1/18: Pt remains mechanically intubated overnight required increased dose of fentanyl and vecuronium x1 dose due to vent dyssynchrony.  Pt currently unable to follow commands RASS goal -4.  Will perform WUA and for possible SBT.  Pt requiring levophed gtt @5  mcg/min and vasopressin 0.03 units/min to maintain map 65 or higher.  Pt extubated to Bipap able to follow commands  1/19: Pt with increased work of breathing due to increased secretions despite scheduled hypertonic saline, Chest PT, and Bipap requiring reintubation  1/20: On minimal vent settings, will keep intubated today as required reintubation yesterday.  Off Vasopressors. KUB with improving ileus, General Surgery ok with starting trickle feeds 1/21: On minimal vent settings.   Daughter at bedside for SBT, FAILED SBT due to tachypnea and increased WOB. 1/22: Pt remains mechanically intubated on PRVC vent settings PEEP 5/FiO2 30% with increased work of breathing; vbg revealed metabolic acidosis requiring 2 amps of bicarb  1/23: Pt remains mechanically intubated on minimal settings.  Tolerating TF's.  Will continue to optimize respiratory status today and plan for SBT on 01/24 with daughter at bedside  1/24:  On minimal vents settings, daughter at bedside for WUA and SBT.  Following commands, EXTUBATED. 1/25: Difficultly managing secretions, required REINTUBATION.  Abdominal wound with worsening dehiscence, Surgery holding off on return to OR unless wound separates further given his frailty 1/26: No SBT today due to reintubation yesterday. Will consult ENT for Tracheostomy given 2 failed extubation attempts.  Off vasopressors. 10/01/23- patient for tracheostomy tommorow.  He has failed 2 extubations.  Electrolytes reivewed and repleted.  Mild AKI noted non essential nephrotoxins reviewed and refined. Discussed with surgery, they think that trache is likely going to prolong suffering only.  Surgery Dr Aleen Campi was able to explain with open abdomen that is not amenable to closure wound dehisence and extravasation will occur and cough from trache will make things worse.  We will discuss all this with family and have GOC conversation.   Interim History / Subjective:  As outlined above under significant events   Objective   Blood pressure (!) 103/57, pulse 73, temperature 98.1 F (36.7 C), temperature source Oral, resp. rate 18, height 5' 10.98" (1.803 m), weight 94.3 kg, SpO2 96%.    Vent Mode: PRVC FiO2 (%):  [28 %] 28 % Set Rate:  [18 bmp] 18 bmp Vt Set:  [500 mL] 500 mL PEEP:  [5 cmH20] 5 cmH20 Plateau Pressure:  [19 cmH20] 19 cmH20   Intake/Output Summary (Last 24 hours) at 10/04/2023 1046 Last data filed at 10/04/2023 0800 Gross per 24 hour  Intake 1894.04 ml  Output  2600 ml  Net -705.96 ml   Filed Weights   10/01/23 0439 10/02/23 0500 10/04/23 0426  Weight: 93.4 kg 93.4 kg 94.3 kg   Examination: General: Acute on chronically-ill appearing male, mechanically intubated and sedated, in NAD HENT: Atraumatic, normocephalic, neck supple, orally intubated Lungs: Mechanical breath sounds throughout, even, synchronous with the vent Cardiovascular: NSR, s1s2, no m/r/g, 2+ radial/1+ distal pulses, no edema Abdomen: +BS x4, soft, non distended, colostomy and mucous fistula in left upper quadrant dressing dry/intact, stool in bag dressing  dry/intact, abdominal binder in place Extremities: Normal bulk and tone, no deformities Neuro: Sedated, withdraws from pain but currently not following commands, pupils PERRL GU: External catheter draining yellow urine   Resolved Hospital Problem list     Assessment & Plan:   #Acute metabolic encephalopathy-IMPROVED  #Mechanical intubation pain/discomfort  Hx: Seizure history and is on anti epileptic therapy at home - Maintain a RASS goal of 0 to -1 - PAD protocol to maintain RASS goal: Dilaudid and Versed drips as needed - Avoid sedating medications as able - Continue scheduled baclofen  - Daily wake up assessment - Continue lamictal and keppra - Seizure precautions   #Acute hypoxic & hypercapnic respiratory failure #Large volume aspiration #COPD without acute exacerbation #Influenza A infection (present on admission) Failed 2 Extubation attempts - Full vent support for now  - Continue lung protective strategies - Maintain plateau pressures less than 30 cm H20 - Wean FiO2 & PEEP as tolerated to maintain O2 sats >92% - Follow intermittent CXR & ABG as needed - SBT once respiratory parameters met and mental status permits - Implement VAP Bundle - Scheduled and prn bronchodilator therapy  - IV steroids - ABX as outlined above   #Shock: suspect septic +/- hypovolemic ~ RESOLVED #Chronic HFpEF Echocardiogram  09/22/23: EF 50%; left ventricle demonstrates global hypokinesis; mildly elevated pulmonary artery systolic pressure; large pleural effusion in the left lateral region; trivial mitral valve regurgitation; trivial aortic valve regurgitation  - Continuous telemetry monitoring - Trend lactic acid until normalized - Troponin peaked at 25 - Diuresis as BP and renal function permits  #AKI on CKD stage II~RESOLVED #Mild Hypernatremia #BPH - Trend BMP - Replace electrolytes as indicated  - Ensure adequate renal perfusion - Avoid nephrotoxic agents as able - Replace electrolytes as indicated ~ Pharmacy following for assistance with electrolyte replacement - Start Free water flushes 100 ml q4h  #Aspiration pneumonia #Pseudomonas pneumonia ~ TREATED  #Influenza Infection (POA)~TREATED - Trend WBC and monitor fever curve - Trend PCT   - Follow cultures as above - Completed 7 day course of ABX as above  #Small bowel obstruction vs ileus #Colostomy irrigation/prolapse #Hx: ogilvie syndrome s/p colostomy - NPO; OGT to LIS - General Surgery following, appreciate input: continue TF's   #Anemia - Trend CBC  - Monitor for s/sx of bleeding  - Transfuse for hgb <7   #Prediabetes - CBG's q4hrs - Target range of 140 to 180 - SSI - Follow ICU Hypo/Hyperglycemia protocol   Patient is critically ill with shock and multiorgan failure.  Prognosis is guarded, high risk for further decompensation, cardiac arrest and death.  Given current critical illness superimposed on multiple chronic comorbidities including colon cancer, along with advanced age, overall long-term prognosis is poor.  Recommend consideration for DNR status.  Palliative care is following for ongoing goals of care discussions.  Patient's family currently requests full code and aggressive measures.   Best Practice (right click and "Reselect all SmartList Selections" daily)   Diet/type: TF's DVT prophylaxis: Lovenox  GI prophylaxis:  PPI Lines: Central line and yes and it is still needed  Foley:  N/A  Code Status:  full code Last date of multidisciplinary goals of care discussion [09/30/23]    Labs   CBC: Recent Labs  Lab 09/28/23 0308 09/29/23 0401 09/30/23 0359 10/01/23 0431 10/02/23 0401 10/03/23 0445 10/04/23 0354  WBC 14.9*   < > 8.5 12.3* 12.6* 14.6* 16.9*  NEUTROABS 8.4*  --   --   --   --   --   --  HGB 7.9*   < > 8.0* 7.7* 8.2* 7.7* 7.8*  HCT 26.7*   < > 26.7* 27.1* 28.0* 26.1* 26.7*  MCV 95.0   < > 94.7 97.5 96.6 97.0 98.2  PLT 345   < > 326 281 316 246 261   < > = values in this interval not displayed.    Basic Metabolic Panel: Recent Labs  Lab 09/28/23 0308 09/29/23 0401 09/30/23 0351 10/01/23 0431 10/01/23 1515 10/01/23 1814 10/02/23 0401 10/03/23 0445 10/04/23 0354  NA 146*   < > 147* 146*  --   --  146* 143 144  K 4.7   < > 5.1 5.2* 4.6 4.6 4.0 5.2* 4.4  CL 112*   < > 111 113*  --   --  115* 103 109  CO2 23   < > 26 24  --   --  26 26 25   GLUCOSE 177*   < > 114* 147*  --   --  143* 157* 139*  BUN 38*   < > 37* 55*  --   --  47* 62* 82*  CREATININE 0.93   < > 1.00 1.26*  --   --  1.04 1.21 1.39*  CALCIUM 8.2*   < > 8.3* 8.1*  --   --  8.2* 8.2* 8.0*  MG 2.3  --   --  2.4  --   --  2.3 2.4 2.5*  PHOS 3.1   < > 5.4* 5.8*  --   --  4.2 3.7 3.1   < > = values in this interval not displayed.   GFR: Estimated Creatinine Clearance: 54.7 mL/min (A) (by C-G formula based on SCr of 1.39 mg/dL (H)). Recent Labs  Lab 10/01/23 0431 10/02/23 0401 10/03/23 0445 10/04/23 0354  WBC 12.3* 12.6* 14.6* 16.9*    Liver Function Tests: Recent Labs  Lab 09/30/23 0351 10/01/23 0431 10/02/23 0401 10/03/23 0445 10/04/23 0354  ALBUMIN 2.4* 2.2* 2.5* 2.4* 2.3*   No results for input(s): "LIPASE", "AMYLASE" in the last 168 hours. No results for input(s): "AMMONIA" in the last 168 hours.  ABG    Component Value Date/Time   PHART 7.47 (H) 09/29/2023 1729   PCO2ART 38 09/29/2023 1729    PO2ART 62 (L) 09/29/2023 1729   HCO3 27.7 09/29/2023 1729   ACIDBASEDEF 0.8 09/27/2023 0813   O2SAT 78.9 10/03/2023 0500     Coagulation Profile: No results for input(s): "INR", "PROTIME" in the last 168 hours.  Cardiac Enzymes: No results for input(s): "CKTOTAL", "CKMB", "CKMBINDEX", "TROPONINI" in the last 168 hours.  HbA1C: Hgb A1c MFr Bld  Date/Time Value Ref Range Status  05/28/2022 06:17 PM 5.8 (H) 4.8 - 5.6 % Final    Comment:    (NOTE) Pre diabetes:          5.7%-6.4%  Diabetes:              >6.4%  Glycemic control for   <7.0% adults with diabetes   08/09/2019 07:31 AM 5.7 (H) 4.8 - 5.6 % Final    Comment:    (NOTE) Pre diabetes:          5.7%-6.4% Diabetes:              >6.4% Glycemic control for   <7.0% adults with diabetes     CBG: Recent Labs  Lab 10/03/23 1622 10/03/23 1955 10/03/23 2335 10/04/23 0355 10/04/23 0748  GLUCAP 119* 123* 136* 130* 125*    Review of Systems:  Unable to assess due to Intubation/sedation/critical illness  Past Medical History:  He,  has a past medical history of Alcohol abuse, Anemia, Anxiety, Arthritis, Cancer (HCC), COPD (chronic obstructive pulmonary disease) (HCC), Coronary artery disease, Depression, Gout, Hemiplegia and 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 side (HCC), Hypertension, Myocardial infarction (HCC), Neuromuscular disorder (HCC), Ogilvie syndrome, Seizures (HCC), Stroke (HCC), Tremors of nervous system, and Unstable angina (HCC).   Surgical History:   Past Surgical History:  Procedure Laterality Date   CARPAL TUNNEL RELEASE Left 10/19/2015   Procedure: CARPAL TUNNEL RELEASE;  Surgeon: Kennedy Bucker, MD;  Location: [facility];  Service: Orthopedics;  Laterality: Left;   COLONOSCOPY WITH PROPOFOL N/A 10/26/2021   Procedure: COLONOSCOPY WITH PROPOFOL;  Surgeon: Regis Bill, MD;  Location: [facility];  Service: Endoscopy;  Laterality: N/A;   COLONOSCOPY WITH  PROPOFOL N/A 06/02/2022   Procedure: COLONOSCOPY WITH PROPOFOL;  Surgeon: Regis Bill, MD;  Location: [facility];  Service: Endoscopy;  Laterality: N/A;   COLONOSCOPY WITH PROPOFOL N/A 06/06/2022   Procedure: COLONOSCOPY WITH PROPOFOL;  Surgeon: Regis Bill, MD;  Location: [facility];  Service: Endoscopy;  Laterality: N/A;   COLOSTOMY REVISION N/A 09/11/2023   Procedure: COLOSTOMY REVISION;  Surgeon: Henrene Dodge, MD;  Location: [facility];  Service: General;  Laterality: N/A;   JOINT REPLACEMENT     left partial hip    LAPAROSCOPIC SIGMOID COLECTOMY  09/12/2010   Lap hand assisted sigmoidectomy, mobilization splenic flexure -- Dr. Freida Busman   LAPAROTOMY N/A 09/11/2023   Procedure: EXPLORATORY LAPAROTOMY;  Surgeon: Henrene Dodge, MD;  Location: [facility];  Service: General;  Laterality: N/A;   LAPAROTOMY N/A 09/15/2023   Procedure: EXPLORATORY LAPAROTOMY;  Surgeon: Sung Amabile, DO;  Location: [facility];  Service: General;  Laterality: N/A;   LYSIS OF ADHESION N/A 09/11/2023   Procedure: LYSIS OF ADHESION;  Surgeon: Henrene Dodge, MD;  Location: [facility];  Service: General;  Laterality: N/A;   THROAT SURGERY  09/05/2011   cancer     Social History:   reports that he has quit smoking. His smoking use included cigarettes. He has never used smokeless tobacco. He reports that he does not currently use alcohol. He reports that he does not use drugs.   Family History:  His family history includes Cancer in his mother; Hypertension in his father; Leukemia in his brother.   Allergies No Known Allergies   Home Medications  Prior to Admission medications   Medication Sig Start Date End Date Taking? Authorizing Provider  aspirin 81 MG chewable tablet Take 81 mg by mouth daily.   Yes [provider]  baclofen (LIORESAL) 5 mg TABS tablet Take 5 mg by mouth 2 (two) times daily.   Yes [provider]  diazePAM, 20 MG Dose, (VALTOCO 20 MG DOSE) 2 x 10 MG/0.1ML LQPK Place  20 mg into the nose as needed (seizure lasting over 2 minutes). 08/23/23  Yes Leroy Sea, MD  ipratropium-albuterol (DUONEB) 0.5-2.5 (3) MG/3ML SOLN Inhale 3 mLs into the lungs every 6 (six) hours as needed. 09/02/23  Yes [provider]  lactulose (CHRONULAC) 10 GM/15ML solution Take 20 g by mouth daily. 08/15/23  Yes [provider]  lamoTRIgine (LAMICTAL) 25 MG tablet Take 50 mg by mouth 2 (two) times daily.   Yes [provider]  levETIRAcetam (KEPPRA) 1000 MG tablet Take 1 tablet (1,000 mg total) by mouth 2 (two) times daily. 08/23/23  Yes Leroy Sea, MD  Melatonin 10 MG  TABS Take 10 mg by mouth at bedtime.   Yes [provider]  nitroGLYCERIN (NITROSTAT) 0.4 MG SL tablet Place 0.4 mg under the tongue 3 (three) times daily as needed for chest pain. Every 5 minutes up to 3 doses 08/24/23  Yes [provider]  pantoprazole (PROTONIX) 40 MG tablet Take 1 tablet (40 mg total) by mouth daily. 08/23/23  Yes Leroy Sea, MD  polyethylene glycol (MIRALAX / GLYCOLAX) 17 g packet Take 17 g by mouth 2 (two) times daily. 08/19/23  Yes Wieting, Richard, MD  pregabalin (LYRICA) 100 MG capsule Take 200 mg by mouth at bedtime.   Yes [provider]  senna (SENOKOT) 8.6 MG TABS tablet Take 2 tablets (17.2 mg total) by mouth at bedtime as needed for mild constipation or moderate constipation. 06/26/23  Yes Sunnie Nielsen, DO  tamsulosin (FLOMAX) 0.4 MG CAPS capsule Take 0.4 mg by mouth every evening.   Yes [provider]  divalproex (DEPAKOTE) 125 MG DR tablet Take 4 tablets (500 mg total) by mouth 2 (two) times daily for 1 day, THEN 4 tablets (500 mg total) daily for 3 days, THEN 2 tablets (250 mg total) daily for 3 days, THEN 1 tablet (125 mg total) daily for 3 days. Patient not taking: Reported on 09/03/2023 08/23/23 09/03/23  Leroy Sea, MD  fluticasone [facility]) 50 MCG/ACT nasal spray Place 2 sprays into both  nostrils daily. Patient not taking: Reported on 09/03/2023 05/15/23   [provider]  Mouthwashes (MOUTH RINSE) LIQD solution 15 mLs by Mouth Rinse route every 2 (two) hours. Patient not taking: Reported on 09/03/2023 08/19/23   Alford Highland, MD  pregabalin (LYRICA) 50 MG capsule Take 50 mg capsule twice daily and 100 mg (two capsules) at bedtime Patient not taking: Reported on 09/03/2023 08/23/23   Leroy Sea, MD  tiotropium (SPIRIVA) 18 MCG inhalation capsule Place 18 mcg into inhaler and inhale daily. Patient not taking: Reported on 09/03/2023    [provider]     Critical care provider statement:  Total critical care time: 32 minutes   Performed by: Karna Christmas MD   Critical care time was exclusive of separately billable procedures and treating other patients.   Critical care was necessary to treat or prevent imminent or life-threatening deterioration.   Critical care was time spent personally by me on the following activities: development of treatment plan with patient and/or surrogate as well as nursing, discussions with consultants, evaluation of patient's response to treatment, examination of patient, obtaining history from patient or surrogate, ordering and performing treatments and interventions, ordering and review of laboratory studies, ordering and review of radiographic studies, pulse oximetry and re-evaluation of patient's condition.    Vida Rigger, M.D.  Pulmonary & Critical Care Medicine

## 2023-10-05 DIAGNOSIS — J9601 Acute respiratory failure with hypoxia: Secondary | ICD-10-CM | POA: Diagnosis not present

## 2023-10-05 DIAGNOSIS — Z7189 Other specified counseling: Secondary | ICD-10-CM | POA: Diagnosis not present

## 2023-10-05 LAB — CBC
HCT: 29.3 % — ABNORMAL LOW (ref 39.0–52.0)
Hemoglobin: 8.5 g/dL — ABNORMAL LOW (ref 13.0–17.0)
MCH: 28.7 pg (ref 26.0–34.0)
MCHC: 29 g/dL — ABNORMAL LOW (ref 30.0–36.0)
MCV: 99 fL (ref 80.0–100.0)
Platelets: 253 10*3/uL (ref 150–400)
RBC: 2.96 MIL/uL — ABNORMAL LOW (ref 4.22–5.81)
RDW: 17.2 % — ABNORMAL HIGH (ref 11.5–15.5)
WBC: 16.4 10*3/uL — ABNORMAL HIGH (ref 4.0–10.5)
nRBC: 0.2 % (ref 0.0–0.2)

## 2023-10-05 LAB — RENAL FUNCTION PANEL
Albumin: 2.2 g/dL — ABNORMAL LOW (ref 3.5–5.0)
Anion gap: 11 (ref 5–15)
BUN: 86 mg/dL — ABNORMAL HIGH (ref 8–23)
CO2: 27 mmol/L (ref 22–32)
Calcium: 8.3 mg/dL — ABNORMAL LOW (ref 8.9–10.3)
Chloride: 109 mmol/L (ref 98–111)
Creatinine, Ser: 1.36 mg/dL — ABNORMAL HIGH (ref 0.61–1.24)
GFR, Estimated: 55 mL/min — ABNORMAL LOW (ref >60)
Glucose, Bld: 152 mg/dL — ABNORMAL HIGH (ref 70–99)
Phosphorus: 3.5 mg/dL (ref 2.5–4.6)
Potassium: 4.8 mmol/L (ref 3.5–5.1)
Sodium: 147 mmol/L — ABNORMAL HIGH (ref 135–145)

## 2023-10-05 LAB — GLUCOSE, CAPILLARY
Glucose-Capillary: 119 mg/dL — ABNORMAL HIGH (ref 70–99)
Glucose-Capillary: 131 mg/dL — ABNORMAL HIGH (ref 70–99)
Glucose-Capillary: 134 mg/dL — ABNORMAL HIGH (ref 70–99)
Glucose-Capillary: 131 mg/dL — ABNORMAL HIGH (ref 70–99)
Glucose-Capillary: 130 mg/dL — ABNORMAL HIGH (ref 70–99)
Glucose-Capillary: 134 mg/dL — ABNORMAL HIGH (ref 70–99)

## 2023-10-05 LAB — C-REACTIVE PROTEIN: CRP: 19.3 mg/dL — ABNORMAL HIGH (ref <1.0)

## 2023-10-05 LAB — MAGNESIUM: Magnesium: 2.4 mg/dL (ref 1.7–2.4)

## 2023-10-05 NOTE — Progress Notes (Signed)
NAME:  Calvin [name], MRN:  [id number], DOB:  [birth year], LOS: 32 ADMISSION DATE:  09/03/2023, CONSULTATION DATE:  09/21/2023 REFERRING MD:  Manuela Schwartz, NP, CHIEF COMPLAINT:  Acute Respiratory Distress, Aspiration event   Brief Pt Description / Synopsis:  73 y.o. male admitted with PMHx most significant for colon cancer status post transverse colectomy and colostomy, presented with nausea, vomiting, and shortness of breath.  Was admitted with Sepsis due to Influenza A infection, Aspiration pneumonia, and Small Bowel Obstruction.  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  Course further complicated by Aspiration event requiring intubation and mechanical ventilation on 1/17.  Has been unable to tolerate extubation on 2 occasions due to inability to manage secretions and protect his airway.  Plan for Tracheostomy placement.  History of Present Illness:  Calvin [name] is a 73 y.o. 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   Was admitted by [facility] for further workup and treatment of Was admitted with Sepsis due to Influenza A infection, Aspiration pneumonia, and Small Bowel Obstruction.  Please see "Significant Hospital Events" section below for full detailed hospital course.  10/02/23- patient had good UOP today, tracheostomy was cancelled.  10/03/23- patient with active output from rectal tube, abdomen still open with no plan for surgery.  There are ongoing conversations with family regarding de-escalation of care to potential comfort.  No changes in medical plan for today.   10/04/23-Patient had Bipap today, repeat aBG shows markedly imporved CO2 and Ph.  His mentation is more lucid and appropriate.  10/05/23- palliative care meeting today with family for possible de-escalation.  There is plan for comfort measures tommorow in am.   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 side (HCC)    Hypertension    Myocardial infarction (HCC)    Neuromuscular disorder (HCC)    Ogilvie syndrome    Seizures (HCC)    last 6 months ago   Stroke [facility])    multiple  left side weakness   Tremors of nervous system    Unstable angina (HCC)      Micro Data:  12/30: SARS-CoV-2/RSV/Flu PCR>>+ Influenza A 12/30: Blood cultures x2>>negative  12/30: Urine>>Staphylococcus Aureus 12/30: MRSA PCR + 1/17: Tracheal aspirate>>moderate pseudomonas  1/18: MRSA PCR +  Anti-infectives (From admission, onward)    Start     Dose/Rate Route Frequency Ordered Stop   09/23/23 1100  vancomycin (VANCOCIN) IVPB 1000 mg/200 mL premix  Status:  Discontinued        1,000 mg 200 mL/hr over 60 Minutes Intravenous Every 12 hours 09/23/23 0811 09/24/23 1021   09/22/23 2300  vancomycin (VANCOCIN) IVPB 1000 mg/200 mL premix  Status:  Discontinued        1,000 mg 200 mL/hr over  60 Minutes Intravenous Every 24 hours 09/22/23 0819 09/23/23 0811   09/22/23 1000  vancomycin (VANCOREADY) IVPB 1500 mg/300 mL        1,500 mg 150 mL/hr over 120 Minutes Intravenous  Once 09/22/23 0819 09/22/23 1916   09/21/23 0815  piperacillin-tazobactam (ZOSYN) IVPB 3.375 g        3.375 g 12.5 mL/hr over 240 Minutes Intravenous Every 8 hours 09/21/23 0729 09/27/23 2115   09/11/23 2000  cefoTEtan  (CEFOTAN) 2 g in sodium chloride 0.9 % 100 mL IVPB        2 g 200 mL/hr over 30 Minutes Intravenous Every 8 hours 09/11/23 1801 09/12/23 1910   09/11/23 0600  cefoTEtan (CEFOTAN) 2 g in sodium chloride 0.9 % 100 mL IVPB        2 g 200 mL/hr over 30 Minutes Intravenous On call to O.R. 09/10/23 0858 09/12/23 1910   09/04/23 1200  Ampicillin-Sulbactam (UNASYN) 3 g in sodium chloride 0.9 % 100 mL IVPB  Status:  Discontinued        3 g 200 mL/hr over 30 Minutes Intravenous Every 6 hours 09/04/23 1052 09/08/23 1537   09/03/23 1800  ceFEPIme (MAXIPIME) 2 g in sodium chloride 0.9 % 100 mL IVPB  Status:  Discontinued        2 g 200 mL/hr over 30 Minutes Intravenous Every 12 hours 09/03/23 0812 09/04/23 0903   09/03/23 1500  metroNIDAZOLE (FLAGYL) IVPB 500 mg  Status:  Discontinued        500 mg 100 mL/hr over 60 Minutes Intravenous Every 12 hours 09/03/23 0758 09/04/23 0903   09/03/23 1000  oseltamivir (TAMIFLU) capsule 30 mg        30 mg Oral 2 times daily 09/03/23 0757 09/08/23 0959   09/03/23 0315  ceFEPIme (MAXIPIME) 2 g in sodium chloride 0.9 % 100 mL IVPB        2 g 200 mL/hr over 30 Minutes Intravenous  Once 09/03/23 0304 09/03/23 0441   09/03/23 0315  metroNIDAZOLE (FLAGYL) IVPB 500 mg        500 mg 100 mL/hr over 60 Minutes Intravenous  Once 09/03/23 0304 09/03/23 0441   09/03/23 0315  vancomycin (VANCOCIN) IVPB 1000 mg/200 mL premix        1,000 mg 200 mL/hr over 60 Minutes Intravenous  Once 09/03/23 0304 09/03/23 0441      Significant Hospital Events: Including procedures, antibiotic start and stop dates in addition to other pertinent events   12/30: admitted to hospitalist sepsis, AKI, (+)Influenza A, likely aspiration pneumonia, acute hypoxic resp fail, SBO. NG placed, surgical consult.  12/31-01/06: relatively stable, remains SBO w/ NG and on TPN, worsening colostomy prolapse. Anemia w/o overt GIB, holding anti-plt/anti-cogs, GI no plans for endoscopic eval at this time 01/07:  exlap, LOA, and colostomy revision  01/08: stable.  01/09: d/c NG 01/10: advancing diet as able. Hgb 6.8 - 1 unit PRBC given  01/11: persistent abd pain and cough, imaging concerning for abdominal surgical wound dehiscence deeper layers, going back to OR. Continue w/ cough suppression, await over-read but no obvious CT chest findings, suspect post-viral bronchitis  01/12: to OR early AM for closure of wound dehiscence. Postop stable, NG in place. CT chest question pneumonia but pt reports coughing improved, suspect was mostly reflux, will continue to monitor cough. Postop care per general surgery include clamp NG and remain NPO for now   01/13: NG out, CLD 01/14: FLD, continue advancing diet tolerated  1/15: Advanced  to dysphagia diet after ST eval.  Discontinue telemetry, palliative care consult, DC central line 1/16: Underwent CT abdomen/pelvis last evening.  Not a candidate for another surgery due to poor functional and nutritional status and multiple recent procedures.  Family deciding on goals of care/hospice 1/17: Aspirated this morning with development of severe respiratory distress and hypoxia requiring emergent intubation.  Now with developing septic shock.  Pt's daughter requesting transfer to [facility] or [facility].  Dr. Aleen Campi spoke both facilities which are at capacity and not accepting transfers. 1/18: Pt remains mechanically intubated overnight required increased dose of fentanyl and vecuronium x1 dose due to vent dyssynchrony.  Pt currently unable to follow commands RASS goal -4.  Will perform WUA and for possible SBT.  Pt requiring levophed gtt @5  mcg/min and vasopressin 0.03 units/min to maintain map 65 or higher.  Pt extubated to Bipap able to follow commands  1/19: Pt with increased work of breathing due to increased secretions despite scheduled hypertonic saline, Chest PT, and Bipap requiring reintubation  1/20: On minimal vent settings, will keep intubated today as required reintubation  yesterday.  Off Vasopressors. KUB with improving ileus, General Surgery ok with starting trickle feeds 1/21: On minimal vent settings.  Daughter at bedside for SBT, FAILED SBT due to tachypnea and increased WOB. 1/22: Pt remains mechanically intubated on PRVC vent settings PEEP 5/FiO2 30% with increased work of breathing; vbg revealed metabolic acidosis requiring 2 amps of bicarb  1/23: Pt remains mechanically intubated on minimal settings.  Tolerating TF's.  Will continue to optimize respiratory status today and plan for SBT on 01/24 with daughter at bedside  1/24:  On minimal vents settings, daughter at bedside for WUA and SBT.  Following commands, EXTUBATED. 1/25: Difficultly managing secretions, required REINTUBATION.  Abdominal wound with worsening dehiscence, Surgery holding off on return to OR unless wound separates further given his frailty 1/26: No SBT today due to reintubation yesterday. Will consult ENT for Tracheostomy given 2 failed extubation attempts.  Off vasopressors. 10/01/23- patient for tracheostomy tommorow.  He has failed 2 extubations.  Electrolytes reivewed and repleted.  Mild AKI noted non essential nephrotoxins reviewed and refined. Discussed with surgery, they think that trache is likely going to prolong suffering only.  Surgery Dr Aleen Campi was able to explain with open abdomen that is not amenable to closure wound dehisence and extravasation will occur and cough from trache will make things worse.  We will discuss all this with family and have GOC conversation.   Interim History / Subjective:  As outlined above under significant events   Objective   Blood pressure (!) 104/56, pulse 89, temperature 99.1 F (37.3 C), temperature source Axillary, resp. rate 18, height 5' 10.98" (1.803 m), weight 94 kg, SpO2 93%.    Vent Mode: PRVC FiO2 (%):  [28 %] 28 % Set Rate:  [18 bmp] 18 bmp Vt Set:  [500 mL] 500 mL PEEP:  [5 cmH20] 5 cmH20 Plateau Pressure:  [20 cmH20] 20 cmH20    Intake/Output Summary (Last 24 hours) at 10/05/2023 1037 Last data filed at 10/05/2023 1000 Gross per 24 hour  Intake 2145.18 ml  Output 2650 ml  Net -504.82 ml   Filed Weights   10/02/23 0500 10/04/23 0426 10/05/23 0414  Weight: 93.4 kg 94.3 kg 94 kg   Examination: General: Acute on chronically-ill appearing male, mechanically intubated and sedated, in NAD HENT: Atraumatic, normocephalic, neck supple, orally intubated Lungs: Mechanical breath sounds throughout, even, synchronous with the vent Cardiovascular: NSR, s1s2,  no m/r/g, 2+ radial/1+ distal pulses, no edema Abdomen: +BS x4, soft, non distended, colostomy and mucous fistula in left upper quadrant dressing dry/intact, stool in bag dressing dry/intact, abdominal binder in place Extremities: Normal bulk and tone, no deformities Neuro: Sedated, withdraws from pain but currently not following commands, pupils PERRL GU: External catheter draining yellow urine   Resolved Hospital Problem list     Assessment & Plan:   #Acute metabolic encephalopathy-IMPROVED  #Mechanical intubation pain/discomfort  Hx: Seizure history and is on anti epileptic therapy at home - Maintain a RASS goal of 0 to -1 - PAD protocol to maintain RASS goal: Dilaudid and Versed drips as needed - Avoid sedating medications as able - Continue scheduled baclofen  - Daily wake up assessment - Continue lamictal and keppra - Seizure precautions   #Acute hypoxic & hypercapnic respiratory failure #Large volume aspiration #COPD without acute exacerbation #Influenza A infection (present on admission) Failed 2 Extubation attempts - Full vent support for now  - Continue lung protective strategies - Maintain plateau pressures less than 30 cm H20 - Wean FiO2 & PEEP as tolerated to maintain O2 sats >92% - Follow intermittent CXR & ABG as needed - SBT once respiratory parameters met and mental status permits - Implement VAP Bundle - Scheduled and prn  bronchodilator therapy  - IV steroids - ABX as outlined above   #Shock: suspect septic +/- hypovolemic ~ RESOLVED #Chronic HFpEF Echocardiogram 09/22/23: EF 50%; left ventricle demonstrates global hypokinesis; mildly elevated pulmonary artery systolic pressure; large pleural effusion in the left lateral region; trivial mitral valve regurgitation; trivial aortic valve regurgitation  - Continuous telemetry monitoring - Trend lactic acid until normalized - Troponin peaked at 25 - Diuresis as BP and renal function permits  #AKI on CKD stage II~RESOLVED #Mild Hypernatremia #BPH - Trend BMP - Replace electrolytes as indicated  - Ensure adequate renal perfusion - Avoid nephrotoxic agents as able - Replace electrolytes as indicated ~ Pharmacy following for assistance with electrolyte replacement - Start Free water flushes 100 ml q4h  #Aspiration pneumonia #Pseudomonas pneumonia ~ TREATED  #Influenza Infection (POA)~TREATED - Trend WBC and monitor fever curve - Trend PCT   - Follow cultures as above - Completed 7 day course of ABX as above  #Small bowel obstruction vs ileus #Colostomy irrigation/prolapse #Hx: ogilvie syndrome s/p colostomy - NPO; OGT to LIS - General Surgery following, appreciate input: continue TF's   #Anemia - Trend CBC  - Monitor for s/sx of bleeding  - Transfuse for hgb <7   #Prediabetes - CBG's q4hrs - Target range of 140 to 180 - SSI - Follow ICU Hypo/Hyperglycemia protocol   Patient is critically ill with shock and multiorgan failure.  Prognosis is guarded, high risk for further decompensation, cardiac arrest and death.  Given current critical illness superimposed on multiple chronic comorbidities including colon cancer, along with advanced age, overall long-term prognosis is poor.  Recommend consideration for DNR status.  Palliative care is following for ongoing goals of care discussions.  Patient's family currently requests full code and aggressive  measures.   Best Practice (right click and "Reselect all SmartList Selections" daily)   Diet/type: TF's DVT prophylaxis: Lovenox  GI prophylaxis: PPI Lines: Central line and yes and it is still needed  Foley:  N/A  Code Status:  full code Last date of multidisciplinary goals of care discussion [09/30/23]    Labs   CBC: Recent Labs  Lab 10/01/23 0431 10/02/23 0401 10/03/23 0445 10/04/23 0354 10/05/23 0331  WBC 12.3* 12.6* 14.6* 16.9* 16.4*  HGB 7.7* 8.2* 7.7* 7.8* 8.5*  HCT 27.1* 28.0* 26.1* 26.7* 29.3*  MCV 97.5 96.6 97.0 98.2 99.0  PLT 281 316 246 261 253    Basic Metabolic Panel: Recent Labs  Lab 10/01/23 0431 10/01/23 1515 10/01/23 1814 10/02/23 0401 10/03/23 0445 10/04/23 0354 10/05/23 0331  NA 146*  --   --  146* 143 144 147*  K 5.2*   < > 4.6 4.0 5.2* 4.4 4.8  CL 113*  --   --  115* 103 109 109  CO2 24  --   --  26 26 25 27   GLUCOSE 147*  --   --  143* 157* 139* 152*  BUN 55*  --   --  47* 62* 82* 86*  CREATININE 1.26*  --   --  1.04 1.21 1.39* 1.36*  CALCIUM 8.1*  --   --  8.2* 8.2* 8.0* 8.3*  MG 2.4  --   --  2.3 2.4 2.5* 2.4  PHOS 5.8*  --   --  4.2 3.7 3.1 3.5   < > = values in this interval not displayed.   GFR: Estimated Creatinine Clearance: 55.8 mL/min (A) (by C-G formula based on SCr of 1.36 mg/dL (H)). Recent Labs  Lab 10/02/23 0401 10/03/23 0445 10/04/23 0354 10/05/23 0331  WBC 12.6* 14.6* 16.9* 16.4*    Liver Function Tests: Recent Labs  Lab 10/01/23 0431 10/02/23 0401 10/03/23 0445 10/04/23 0354 10/05/23 0331  ALBUMIN 2.2* 2.5* 2.4* 2.3* 2.2*   No results for input(s): "LIPASE", "AMYLASE" in the last 168 hours. No results for input(s): "AMMONIA" in the last 168 hours.  ABG    Component Value Date/Time   PHART 7.47 (H) 09/29/2023 1729   PCO2ART 38 09/29/2023 1729   PO2ART 62 (L) 09/29/2023 1729   HCO3 27.7 09/29/2023 1729   ACIDBASEDEF 0.8 09/27/2023 0813   O2SAT 78.9 10/03/2023 0500     Coagulation Profile: No  results for input(s): "INR", "PROTIME" in the last 168 hours.  Cardiac Enzymes: No results for input(s): "CKTOTAL", "CKMB", "CKMBINDEX", "TROPONINI" in the last 168 hours.  HbA1C: Hgb A1c MFr Bld  Date/Time Value Ref Range Status  05/28/2022 06:17 PM 5.8 (H) 4.8 - 5.6 % Final    Comment:    (NOTE) Pre diabetes:          5.7%-6.4%  Diabetes:              >6.4%  Glycemic control for   <7.0% adults with diabetes   08/09/2019 07:31 AM 5.7 (H) 4.8 - 5.6 % Final    Comment:    (NOTE) Pre diabetes:          5.7%-6.4% Diabetes:              >6.4% Glycemic control for   <7.0% adults with diabetes     CBG: Recent Labs  Lab 10/04/23 1552 10/04/23 1932 10/04/23 2338 10/05/23 0335 10/05/23 0737  GLUCAP 139* 134* 119* 131* 131*    Review of Systems:   Unable to assess due to Intubation/sedation/critical illness  Past Medical History:  He,  has a past medical history of Alcohol abuse, Anemia, Anxiety, Arthritis, Cancer (HCC), COPD (chronic obstructive pulmonary disease) (HCC), Coronary artery disease, Depression, Gout, Hemiplegia and 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 side (HCC), Hypertension, Myocardial infarction (HCC), Neuromuscular disorder (HCC), Ogilvie syndrome, Seizures (HCC), Stroke (HCC), Tremors of nervous system, and Unstable angina (HCC).   Surgical History:  Past Surgical History:  Procedure Laterality Date   CARPAL TUNNEL RELEASE Left 10/19/2015   Procedure: CARPAL TUNNEL RELEASE;  Surgeon: Kennedy Bucker, MD;  Location: [facility];  Service: Orthopedics;  Laterality: Left;   COLONOSCOPY WITH PROPOFOL N/A 10/26/2021   Procedure: COLONOSCOPY WITH PROPOFOL;  Surgeon: Regis Bill, MD;  Location: [facility];  Service: Endoscopy;  Laterality: N/A;   COLONOSCOPY WITH PROPOFOL N/A 06/02/2022   Procedure: COLONOSCOPY WITH PROPOFOL;  Surgeon: Regis Bill, MD;  Location: [facility];  Service: Endoscopy;  Laterality:  N/A;   COLONOSCOPY WITH PROPOFOL N/A 06/06/2022   Procedure: COLONOSCOPY WITH PROPOFOL;  Surgeon: Regis Bill, MD;  Location: [facility];  Service: Endoscopy;  Laterality: N/A;   COLOSTOMY REVISION N/A 09/11/2023   Procedure: COLOSTOMY REVISION;  Surgeon: Henrene Dodge, MD;  Location: [facility];  Service: General;  Laterality: N/A;   JOINT REPLACEMENT     left partial hip    LAPAROSCOPIC SIGMOID COLECTOMY  09/12/2010   Lap hand assisted sigmoidectomy, mobilization splenic flexure -- Dr. Freida Busman   LAPAROTOMY N/A 09/11/2023   Procedure: EXPLORATORY LAPAROTOMY;  Surgeon: Henrene Dodge, MD;  Location: [facility];  Service: General;  Laterality: N/A;   LAPAROTOMY N/A 09/15/2023   Procedure: EXPLORATORY LAPAROTOMY;  Surgeon: Sung Amabile, DO;  Location: [facility];  Service: General;  Laterality: N/A;   LYSIS OF ADHESION N/A 09/11/2023   Procedure: LYSIS OF ADHESION;  Surgeon: Henrene Dodge, MD;  Location: [facility];  Service: General;  Laterality: N/A;   THROAT SURGERY  09/05/2011   cancer     Social History:   reports that he has quit smoking. His smoking use included cigarettes. He has never used smokeless tobacco. He reports that he does not currently use alcohol. He reports that he does not use drugs.   Family History:  His family history includes Cancer in his mother; Hypertension in his father; Leukemia in his brother.   Allergies No Known Allergies   Home Medications  Prior to Admission medications   Medication Sig Start Date End Date Taking? Authorizing Provider  aspirin 81 MG chewable tablet Take 81 mg by mouth daily.   Yes [provider]  baclofen (LIORESAL) 5 mg TABS tablet Take 5 mg by mouth 2 (two) times daily.   Yes [provider]  diazePAM, 20 MG Dose, (VALTOCO 20 MG DOSE) 2 x 10 MG/0.1ML LQPK Place 20 mg into the nose as needed (seizure lasting over 2 minutes). 08/23/23  Yes Leroy Sea, MD  ipratropium-albuterol (DUONEB) 0.5-2.5 (3) MG/3ML SOLN  Inhale 3 mLs into the lungs every 6 (six) hours as needed. 09/02/23  Yes [provider]  lactulose (CHRONULAC) 10 GM/15ML solution Take 20 g by mouth daily. 08/15/23  Yes [provider]  lamoTRIgine (LAMICTAL) 25 MG tablet Take 50 mg by mouth 2 (two) times daily.   Yes [provider]  levETIRAcetam (KEPPRA) 1000 MG tablet Take 1 tablet (1,000 mg total) by mouth 2 (two) times daily. 08/23/23  Yes Leroy Sea, MD  Melatonin 10 MG TABS Take 10 mg by mouth at bedtime.   Yes [provider]  nitroGLYCERIN (NITROSTAT) 0.4 MG SL tablet Place 0.4 mg under the tongue 3 (three) times daily as needed for chest pain. Every 5 minutes up to 3 doses 08/24/23  Yes [provider]  pantoprazole (PROTONIX) 40 MG tablet Take 1 tablet (40 mg total) by mouth daily. 08/23/23  Yes Leroy Sea, MD  polyethylene glycol [facility] /  GLYCOLAX) 17 g packet Take 17 g by mouth 2 (two) times daily. 08/19/23  Yes Wieting, Richard, MD  pregabalin (LYRICA) 100 MG capsule Take 200 mg by mouth at bedtime.   Yes [provider]  senna (SENOKOT) 8.6 MG TABS tablet Take 2 tablets (17.2 mg total) by mouth at bedtime as needed for mild constipation or moderate constipation. 06/26/23  Yes Sunnie Nielsen, DO  tamsulosin (FLOMAX) 0.4 MG CAPS capsule Take 0.4 mg by mouth every evening.   Yes [provider]  divalproex (DEPAKOTE) 125 MG DR tablet Take 4 tablets (500 mg total) by mouth 2 (two) times daily for 1 day, THEN 4 tablets (500 mg total) daily for 3 days, THEN 2 tablets (250 mg total) daily for 3 days, THEN 1 tablet (125 mg total) daily for 3 days. Patient not taking: Reported on 09/03/2023 08/23/23 09/03/23  Leroy Sea, MD  fluticasone [facility]) 50 MCG/ACT nasal spray Place 2 sprays into both nostrils daily. Patient not taking: Reported on 09/03/2023 05/15/23   [provider]  Mouthwashes (MOUTH RINSE) LIQD solution 15 mLs by Mouth Rinse route  every 2 (two) hours. Patient not taking: Reported on 09/03/2023 08/19/23   Alford Highland, MD  pregabalin (LYRICA) 50 MG capsule Take 50 mg capsule twice daily and 100 mg (two capsules) at bedtime Patient not taking: Reported on 09/03/2023 08/23/23   Leroy Sea, MD  tiotropium (SPIRIVA) 18 MCG inhalation capsule Place 18 mcg into inhaler and inhale daily. Patient not taking: Reported on 09/03/2023    [provider]     Critical care provider statement:  Total critical care time: 32 minutes   Performed by: Karna Christmas MD   Critical care time was exclusive of separately billable procedures and treating other patients.   Critical care was necessary to treat or prevent imminent or life-threatening deterioration.   Critical care was time spent personally by me on the following activities: development of treatment plan with patient and/or surrogate as well as nursing, discussions with consultants, evaluation of patient's response to treatment, examination of patient, obtaining history from patient or surrogate, ordering and performing treatments and interventions, ordering and review of laboratory studies, ordering and review of radiographic studies, pulse oximetry and re-evaluation of patient's condition.    Vida Rigger, M.D.  Pulmonary & Critical Care Medicine

## 2023-10-05 NOTE — Progress Notes (Signed)
[facility] SURGICAL PROGRESS NOTE  Hospital Day(s): 32.   Post op day(s): 20 Days Post-Op.   Interval History:  Patient seen and examined Remains intubated 4 mcg Levophed this AM Leukocytosis to 16.4K Hgb to 8.5; stable sCr - 1.36; UO - 2160 ccs Colostomy with 400 ccs out Tube feeds running  Vital signs in last 24 hours: [min-max] current  Temp:  [98.1 F (36.7 C)-100.1 F (37.8 C)] 98.9 F (37.2 C) (01/31 0400) Pulse Rate:  [73-97] 80 (01/31 0600) Resp:  [16-20] 18 (01/31 0600) BP: (90-139)/(50-79) 94/56 (01/31 0600) SpO2:  [92 %-97 %] 96 % (01/31 0600) FiO2 (%):  [28 %] 28 % (01/31 0312) Weight:  [94 kg] 94 kg (01/31 0414)     Height: 5' 10.98" (180.3 cm) Weight: 94 kg BMI (Calculated): 28.92   Intake/Output last 2 shifts:  01/30 0701 - 01/31 0700 In: 1903.4 [I.V.:203.4; NG/GT:1500; IV Piggyback:200] Out: 2560 [Urine:2160; Stool:400]   Physical Exam:  Constitutional: Intubated; sedated HEENT: NGT in place; tube feeds running  Respiratory: Intubated; on ventilator Cardiovascular: regular rate at 90 bpm this AM Gastrointestinal: soft, non-distended, 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 Integumentary: Laparotomy is CDI with staples and retention sutures, skin dehiscence to superior aspect of the wound, this appears to be starting to granulate over, no visible bowel, serous drainage present on dressing; changed     Labs:     Latest Ref Rng & Units 10/05/2023    3:31 AM 10/04/2023    3:54 AM 10/03/2023    4:45 AM  CBC  WBC 4.0 - 10.5 K/uL 16.4  16.9  14.6   Hemoglobin 13.0 - 17.0 g/dL 8.5  7.8  7.7   Hematocrit 39.0 - 52.0 % 29.3  26.7  26.1   Platelets 150 - 400 K/uL 253  261  246       Latest Ref Rng & Units 10/05/2023    3:31 AM 10/04/2023    3:54 AM 10/03/2023    4:45 AM  CMP  Glucose 70 - 99 mg/dL 086  578  469   BUN 8 - 23 mg/dL 86  82  62   Creatinine 0.61 - 1.24 mg/dL 6.29  5.28  4.13   Sodium 135 - 145  mmol/L 147  144  143   Potassium 3.5 - 5.1 mmol/L 4.8  4.4  5.2   Chloride 98 - 111 mmol/L 109  109  103   CO2 22 - 32 mmol/L 27  25  26    Calcium 8.9 - 10.3 mg/dL 8.3  8.0  8.2     Imaging studies: No new pertinent imaging studies   Assessment/Plan:  73 y.o. male with continued respiratory failure requiring intubation  20 Days Post-Op s/p closure of wound dehiscence with rentention sutures s/p initial exploratory laparotomy, LOA, and colostomy revision on 01/07    - Appreciate PCCM support; ventilator/vasopressor management per their service. Tracheostomy cancelled 01/28             - Continue tube feedings as tolerated             - Monitor abdominal examination             - If sedated/intubated, we can hold on abdominal binder as he is relaxed now             - Again would not recommend any surgical intervention for his dehiscence at this point, which still remains contained and  seems to be healing well. Any surgical intervention would be at high risk of further complications and he currently has very poor reserve. This has been discussed extensively with patient's daughter this week.              - Wound care: Cover with xeroform gauze, superficial gauze, and ABD pad as needed. Change daily and as needed - anticipate heavy serous output from this, change as needed              - Appreciate WOC RN care with ostomy             - Retention sutures will need to be maintained for 4-6 weeks from surgery - Monitor colostomy output; record - Pain control prn; antiemetics prn - Further management per primary service; we will follow    All of the above findings and recommendations were discussed with the medical team.   -- Lynden Oxford, PA-C [facility] 10/05/2023, 7:24 AM M-F: 7am - 4pm

## 2023-10-05 NOTE — Plan of Care (Signed)
  Problem: Clinical Measurements: Goal: Will remain free from infection Outcome: Progressing Goal: Diagnostic test results will improve Outcome: Progressing   Problem: Elimination: Goal: Will not experience complications related to bowel motility Outcome: Progressing Goal: Will not experience complications related to urinary retention Outcome: Progressing   Problem: Pain Managment: Goal: General experience of comfort will improve and/or be controlled Outcome: Progressing   Problem: Safety: Goal: Ability to remain free from injury will improve Outcome: Progressing   Problem: Skin Integrity: Goal: Risk for impaired skin integrity will decrease Outcome: Progressing

## 2023-10-05 NOTE — Progress Notes (Signed)
Daily Progress Note   [name]       Date: 10/05/2023 DOB: Oct 18, 1950  Age: 73 y.o. MRN#: [id number] Attending Physician: Vida Rigger, MD Primary Care Physician: Keane Police, MD Admit Date: 09/03/2023  Reason for Consultation/Follow-up: Establishing goals of care  Subjective: Notes labs reviewed.  Into see patient for multidisciplinary family meeting.  Surgery service and CCM present at meeting.  Patient's daughter [name], patient's brother, sister-in-law, and grandson were present.   We discussed his diagnoses, prognosis, GOC, EOL wishes disposition and options.  Created space and opportunity for patient  to explore thoughts and feelings regarding current medical information.   A detailed discussion was had today regarding advanced directives.  Concepts specific to code status, artifical feeding and hydration, IV antibiotics and rehospitalization were discussed.  The difference between an aggressive medical intervention path and a comfort care path was discussed.  Values and goals of care important to patient and family were attempted to be elicited.  Discussed limitations of medical interventions to prolong quality of life in some situations and discussed the concept of human mortality.  Discussed suffering.  Various scenarios discussed.  Decision made to shift to comfort care tomorrow when family is present.      Length of Stay: 32  Current Medications: Scheduled Meds:   Chlorhexidine Gluconate Cloth  6 each Topical Daily   docusate  100 mg Per Tube BID   free water  30 mL Per Tube Q4H   furosemide  20 mg Intravenous Daily   heparin injection (subcutaneous)  5,000 Units Subcutaneous Q8H   insulin aspart  0-15 Units Subcutaneous Q4H    ipratropium-albuterol  3 mL Inhalation TID   lamoTRIgine  50 mg Per Tube BID   nutrition supplement (JUVEN)  1 packet Per Tube BID BM   mouth rinse  15 mL Mouth Rinse Q2H   oxyCODONE  5 mg Per Tube Q6H   pantoprazole (PROTONIX) IV  40 mg Intravenous Q12H   polyethylene glycol  17 g Per Tube Daily   pregabalin  100 mg Per Tube Daily   pregabalin  200 mg Per Tube QHS   sodium chloride flush  10-40 mL Intracatheter Q12H   sodium chloride flush  3-10 mL Intravenous Q12H    Continuous Infusions:  feeding supplement (PIVOT 1.5 CAL) 60  mL/hr at 10/05/23 1000   HYDROmorphone 3 mg/hr (10/05/23 1000)   levETIRAcetam Stopped (10/05/23 0930)   midazolam 1 mg/hr (10/05/23 1000)   norepinephrine (LEVOPHED) Adult infusion 4 mcg/min (10/05/23 1000)    PRN Meds: docusate, fentaNYL (SUBLIMAZE) injection, glycopyrrolate, HYDROmorphone, ipratropium-albuterol, mouth rinse, polyvinyl alcohol, senna, sodium chloride flush, sodium chloride flush  Physical Exam Constitutional:      Comments: Eyes closed  Pulmonary:     Comments: On ventilator            Vital Signs: BP (!) 104/56   Pulse 89   Temp 99.1 F (37.3 C) (Axillary)   Resp 18   Ht 5' 10.98" (1.803 m)   Wt 94 kg   SpO2 93%   BMI 28.92 kg/m  SpO2: SpO2: 93 % O2 Device: O2 Device: Ventilator O2 Flow Rate: O2 Flow Rate (L/min): 2 L/min  Intake/output summary:  Intake/Output Summary (Last 24 hours) at 10/05/2023 1220 Last data filed at 10/05/2023 1000 Gross per 24 hour  Intake 1847.8 ml  Output 2350 ml  Net -502.2 ml   LBM: Last BM Date : 10/04/23 Baseline Weight: Weight: 97.8 kg Most recent weight: Weight: 94 kg    Patient Active Problem List   Diagnosis Date Noted   Aspiration into airway 09/21/2023   Small bowel obstruction due to adhesions (HCC) 09/11/2023   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   Cancer of sigmoid (HCC) 09/04/2023   Gout 09/04/2023   Acute respiratory failure with hypoxia (HCC) 09/03/2023   Acute  metabolic encephalopathy 08/19/2023   History of CVA (cerebrovascular accident) 08/19/2023   Thrombocytopenia (HCC) 08/19/2023   Hypotension 07/15/2023   Small bowel obstruction (HCC) 07/15/2023   Hemiplegia and 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 side (HCC)    Temporal pain 06/18/2022   Acute kidney injury superimposed on CKD (HCC) 06/17/2022   Dysphagia 06/07/2022   Respiratory distress 06/02/2022   Neuropathy 06/02/2022   Paroxysmal atrial flutter (HCC) 06/01/2022   Aspiration pneumonia (HCC) 06/01/2022   Cervical spinal stenosis 05/31/2022   Chronic diastolic CHF (congestive heart failure) (HCC) 05/28/2022   SBO, recurrent (small bowel obstruction) (HCC)    Abdominal distention    HLD (hyperlipidemia) 10/16/2021   Nausea vomiting and diarrhea 10/16/2021   Sepsis (HCC) 10/16/2021   Tobacco abuse 10/16/2021   Muscle twitching 08/14/2019   UTI (urinary tract infection) 08/03/2019   Ileus (HCC) 08/03/2019   Hypokalemia 08/03/2019   QT prolongation 08/03/2019   Ogilvie syndrome    Acute abdominal pain 04/03/2019   Left-sided weakness 09/30/2017   Seizure (HCC) 09/30/2017   Essential hypertension 09/30/2017   CAD (coronary artery disease) 09/30/2017   COPD (chronic obstructive pulmonary disease) (HCC) 09/30/2017   Depression with anxiety 09/30/2017    Palliative Care Assessment & Plan     Recommendations/Plan: Current plans in place to shift to comfort care for anticipated hospital death tomorrow when family is present.  Code Status:    Code Status Orders  (From admission, onward)           Start     Ordered   09/03/23 0754  Full code  Continuous       Question:  By:  Answer:  Consent: discussion documented in EHR   09/03/23 0755           Code Status History     Date Active Date Inactive Code Status Order ID Comments User Context   08/19/2023 1314 08/23/2023 1838 Full Code [id number]  Alan Mulder, MD  Inpatient    08/18/2023 1448 08/19/2023 1154 Full Code [id number]  Emeline General, MD ED   07/18/2023 1748 07/24/2023 1712 Full Code [id number]  Kathrynn Running, MD Inpatient   07/15/2023 0538 07/18/2023 1551 Full Code [id number]  Andris Baumann, MD ED   06/22/2023 [phone] 06/27/2023 0108 Full Code [id number]  Emeline General, MD ED   05/28/2022 1622 06/30/2022 2326 Full Code [id number]  Lorretta Harp, MD ED   10/23/2021 2158 11/02/2021 2029 Full Code [id number]  Lajoyce Corners, NP Inpatient   10/16/2021 1144 10/23/2021 2158 DNR [id number]  Lorretta Harp, MD ED   10/02/2021 0731 10/02/2021 1641 DNR [id number]  Loleta Rose, MD ED   08/03/2019 2226 08/14/2019 2350 Full Code [id number]  Lurene Shadow, MD Inpatient   04/03/2019 1711 04/08/2019 1731 Full Code [id number]  Auburn Bilberry, MD Inpatient   04/03/2019 1627 04/03/2019 1710 DNR [id number]  Auburn Bilberry, MD ED   09/30/2017 2301 10/02/2017 2039 Full Code [id number]  Oralia Manis, MD Inpatient       Prognosis: Poor    Thank you for allowing the Palliative Medicine Team to assist in the care of this patient.   Morton Stall, NP  Please contact Palliative Medicine Team phone at [phone] for questions and concerns.

## 2023-10-05 NOTE — Plan of Care (Signed)
  Problem: Elimination: Goal: Will not experience complications related to bowel motility Outcome: Progressing   Problem: Pain Managment: Goal: General experience of comfort will improve and/or be controlled Outcome: Progressing   Problem: Safety: Goal: Ability to remain free from injury will improve Outcome: Progressing

## 2023-10-06 DIAGNOSIS — A419 Sepsis, unspecified organism: Secondary | ICD-10-CM | POA: Diagnosis not present

## 2023-10-06 DIAGNOSIS — J9601 Acute respiratory failure with hypoxia: Secondary | ICD-10-CM | POA: Diagnosis not present

## 2023-10-06 DIAGNOSIS — Z515 Encounter for palliative care: Secondary | ICD-10-CM | POA: Diagnosis not present

## 2023-10-06 DIAGNOSIS — T17908A Unspecified foreign body in respiratory tract, part unspecified causing other injury, initial encounter: Secondary | ICD-10-CM | POA: Diagnosis not present

## 2023-10-06 LAB — C-REACTIVE PROTEIN: CRP: 20.9 mg/dL — ABNORMAL HIGH (ref <1.0)

## 2023-10-06 MED ORDER — POLYVINYL ALCOHOL 1.4 % OP SOLN: 1.0000 [drp] | Freq: Four times a day (QID) | OPHTHALMIC | Status: DC | PRN

## 2023-10-06 MED ORDER — GLYCOPYRROLATE 1 MG PO TABS: 1.0000 mg | ORAL_TABLET | ORAL | Status: DC | PRN

## 2023-10-06 MED ORDER — MIDAZOLAM-SODIUM CHLORIDE 100-0.9 MG/100ML-% IV SOLN
0.0000 mg/h | INTRAVENOUS | Status: DC
  Administered 2023-10-06: 1 mg/h via INTRAVENOUS

## 2023-10-06 MED ORDER — GLYCOPYRROLATE 0.2 MG/ML IJ SOLN
0.2000 mg | INTRAMUSCULAR | Status: DC | PRN
  Filled 2023-10-06: qty 1

## 2023-10-06 MED ORDER — ACETAMINOPHEN 325 MG PO TABS: 650.0000 mg | ORAL_TABLET | Freq: Four times a day (QID) | ORAL | Status: DC | PRN

## 2023-10-06 MED ORDER — HYDROMORPHONE BOLUS VIA INFUSION
1.0000 mg | INTRAVENOUS | Status: DC | PRN
  Administered 2023-10-06 (×2): 1 mg via INTRAVENOUS

## 2023-10-06 MED ORDER — MIDAZOLAM BOLUS VIA INFUSION (WITHDRAWAL LIFE SUSTAINING TX): 1.0000 mg | INTRAVENOUS | Status: DC | PRN

## 2023-10-06 MED ORDER — ACETAMINOPHEN 650 MG RE SUPP: 650.0000 mg | Freq: Four times a day (QID) | RECTAL | Status: DC | PRN

## 2023-10-06 MED ORDER — GLYCOPYRROLATE 0.2 MG/ML IJ SOLN
0.2000 mg | INTRAMUSCULAR | Status: DC | PRN
  Administered 2023-10-06: 0.2 mg via INTRAVENOUS

## 2023-10-06 MED ORDER — HYDROMORPHONE HCL-NACL 50-0.9 MG/50ML-% IV SOLN
0.0000 mg/h | INTRAVENOUS | Status: DC
  Administered 2023-10-06: 3.25 mg/h via INTRAVENOUS
  Administered 2023-10-06: 3.75 mg/h via INTRAVENOUS
  Filled 2023-10-06: qty 50

## 2023-10-06 MED ORDER — MIDAZOLAM BOLUS VIA INFUSION
1.0000 mg | INTRAVENOUS | Status: DC | PRN
  Administered 2023-10-06: 1 mg via INTRAVENOUS

## 2023-10-06 NOTE — Progress Notes (Signed)
   10/24/2023 1100  Spiritual Encounters  Type of Visit Initial  Care provided to: Family  Reason for visit End-of-life  OnCall Visit Yes  Spiritual Framework  Presenting Themes Significant life change  Family Stress Factors Major life changes  Interventions  Spiritual Care Interventions Made Compassionate presence;Bereavement/grief support;Prayer   Chaplain was paged to provide comfort to family as decision was made to place patient on comfort care.

## 2023-10-06 NOTE — Progress Notes (Addendum)
Family verified and ok'd to update daughter, [place] Desanctis. Daughter updated and telephone number taken to notify of any changes this shift [phone]

## 2023-10-06 NOTE — Progress Notes (Signed)
NAME:  Calvin [name], MRN:  [id number], DOB:  [birth year], LOS: 33 ADMISSION DATE:  09/03/2023, CONSULTATION DATE:  09/21/2023 REFERRING MD:  Manuela Schwartz, NP, CHIEF COMPLAINT:  Acute Respiratory Distress, Aspiration event   Brief Pt Description / Synopsis:  73 y.o. male admitted with PMHx most significant for colon cancer status post transverse colectomy and colostomy, presented with nausea, vomiting, and shortness of breath.  Was admitted with Sepsis due to Influenza A infection, Aspiration pneumonia, and Small Bowel Obstruction.  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  Course further complicated by Aspiration event requiring intubation and mechanical ventilation on 1/17.  Has been unable to tolerate extubation on 2 occasions due to inability to manage secretions and protect his airway.  Plan for Tracheostomy placement.  History of Present Illness:  Calvin [name] is a 73 y.o. 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   Was admitted by [facility] for further workup and treatment of Was admitted with Sepsis due to Influenza A infection, Aspiration pneumonia, and Small Bowel Obstruction.  Please see "Significant Hospital Events" section below for full detailed hospital course.  10/02/23- patient had good UOP today, tracheostomy was cancelled.  10/03/23- patient with active output from rectal tube, abdomen still open with no plan for surgery.  There are ongoing conversations with family regarding de-escalation of care to potential comfort.  No changes in medical plan for today.   10/04/23-Patient had Bipap today, repeat aBG shows markedly imporved CO2 and Ph.  His mentation is more lucid and appropriate.  10/05/23- palliative care meeting today with family for possible de-escalation.  There is plan for comfort measures tommorow in am. Lengthy family discourse today with over 10 people present and agreement to initiate comfort care measures.   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 side (HCC)    Hypertension    Myocardial infarction (HCC)    Neuromuscular disorder (HCC)    Ogilvie syndrome    Seizures (HCC)    last 6 months ago   Stroke [facility])    multiple  left side weakness   Tremors of nervous system    Unstable angina (HCC)      Micro Data:  12/30: SARS-CoV-2/RSV/Flu PCR>>+ Influenza A 12/30: Blood cultures x2>>negative  12/30: Urine>>Staphylococcus Aureus 12/30: MRSA PCR + 1/17: Tracheal aspirate>>moderate pseudomonas  1/18: MRSA PCR +  Anti-infectives (From admission, onward)    Start     Dose/Rate Route Frequency Ordered Stop   09/23/23 1100  vancomycin (VANCOCIN) IVPB 1000 mg/200 mL premix  Status:  Discontinued        1,000 mg 200 mL/hr over 60 Minutes Intravenous Every 12 hours 09/23/23 0811 09/24/23 1021   09/22/23 2300  vancomycin (VANCOCIN) IVPB 1000 mg/200 mL premix  Status:  Discontinued        1,000 mg 200 mL/hr over 60 Minutes Intravenous Every 24 hours 09/22/23 0819 09/23/23 0811   09/22/23 1000  vancomycin (VANCOREADY) IVPB 1500 mg/300 mL        1,500 mg 150 mL/hr over 120 Minutes Intravenous  Once 09/22/23 0819 09/22/23 1916   09/21/23 0815  piperacillin-tazobactam (ZOSYN) IVPB 3.375 g        3.375 g 12.5  mL/hr over 240 Minutes Intravenous Every 8 hours 09/21/23 0729 09/27/23 2115   09/11/23 2000  cefoTEtan (CEFOTAN) 2 g in sodium chloride 0.9 % 100 mL IVPB        2 g 200 mL/hr over 30 Minutes Intravenous Every 8 hours 09/11/23 1801 09/12/23 1910   09/11/23 0600  cefoTEtan (CEFOTAN) 2 g in sodium chloride 0.9 % 100 mL IVPB        2 g 200 mL/hr over 30 Minutes Intravenous On call to O.R. 09/10/23 0858 09/12/23 1910   09/04/23 1200  Ampicillin-Sulbactam (UNASYN) 3 g in sodium chloride 0.9 % 100 mL IVPB  Status:  Discontinued        3 g 200 mL/hr over 30 Minutes Intravenous Every 6 hours 09/04/23 1052 09/08/23 1537   09/03/23 1800  ceFEPIme (MAXIPIME) 2 g in sodium chloride 0.9 % 100 mL IVPB  Status:  Discontinued        2 g 200 mL/hr over 30 Minutes Intravenous Every 12 hours 09/03/23 0812 09/04/23 0903   09/03/23 1500  metroNIDAZOLE (FLAGYL) IVPB 500 mg  Status:  Discontinued        500 mg 100 mL/hr over 60 Minutes Intravenous Every 12 hours 09/03/23 0758 09/04/23 0903   09/03/23 1000  oseltamivir (TAMIFLU) capsule 30 mg        30 mg Oral 2 times daily 09/03/23 0757 09/08/23 0959   09/03/23 0315  ceFEPIme (MAXIPIME) 2 g in sodium chloride 0.9 % 100 mL IVPB        2 g 200 mL/hr over 30 Minutes Intravenous  Once 09/03/23 0304 09/03/23 0441   09/03/23 0315  metroNIDAZOLE (FLAGYL) IVPB 500 mg        500 mg 100 mL/hr over 60 Minutes Intravenous  Once 09/03/23 0304 09/03/23 0441   09/03/23 0315  vancomycin (VANCOCIN) IVPB 1000 mg/200 mL premix        1,000 mg 200 mL/hr over 60 Minutes Intravenous  Once 09/03/23 0304 09/03/23 0441      Significant Hospital Events: Including procedures, antibiotic start and stop dates in addition to other pertinent events   12/30: admitted to hospitalist sepsis, AKI, (+)Influenza A, likely aspiration pneumonia, acute hypoxic resp fail, SBO. NG placed, surgical consult.  12/31-01/06: relatively stable, remains SBO w/ NG and on TPN, worsening colostomy prolapse.  Anemia w/o overt GIB, holding anti-plt/anti-cogs, GI no plans for endoscopic eval at this time 01/07: exlap, LOA, and colostomy revision  01/08: stable.  01/09: d/c NG 01/10: advancing diet as able. Hgb 6.8 - 1 unit PRBC given  01/11: persistent abd pain and cough, imaging concerning for abdominal surgical wound dehiscence deeper layers, going back to OR. Continue w/ cough suppression, await over-read but no obvious CT chest findings, suspect post-viral bronchitis  01/12: to OR early AM for closure of wound dehiscence. Postop stable, NG in place. CT chest question pneumonia but pt reports coughing improved, suspect was mostly reflux, will continue to monitor cough. Postop care per general surgery include clamp NG and remain NPO for now  01/13: NG out, CLD 01/14: FLD, continue advancing diet tolerated  1/15: Advanced to dysphagia diet after ST eval.  Discontinue telemetry, palliative care consult, DC central line 1/16: Underwent CT abdomen/pelvis last evening.  Not a candidate for another surgery due to poor functional and nutritional status and multiple recent procedures.  Family deciding on goals of care/hospice 1/17: Aspirated this morning with development of severe respiratory distress and hypoxia requiring emergent intubation.  Now with developing septic shock.  Pt's daughter requesting transfer to [facility] or [facility].  Dr. Aleen Campi spoke both facilities which are at capacity and not accepting transfers. 1/18: Pt remains mechanically intubated overnight required increased dose of fentanyl and vecuronium x1 dose due to vent dyssynchrony.  Pt currently unable to follow commands RASS goal -4.  Will perform WUA and for possible SBT.  Pt requiring levophed gtt @5  mcg/min and vasopressin 0.03 units/min to maintain map 65 or higher.  Pt extubated to Bipap able to follow commands  1/19: Pt with increased work of breathing due to increased secretions despite scheduled hypertonic saline, Chest PT, and Bipap requiring  reintubation  1/20: On minimal vent settings, will keep intubated today as required reintubation yesterday.  Off Vasopressors. KUB with improving ileus, General Surgery ok with starting trickle feeds 1/21: On minimal vent settings.  Daughter at bedside for SBT, FAILED SBT due to tachypnea and increased WOB. 1/22: Pt remains mechanically intubated on PRVC vent settings PEEP 5/FiO2 30% with increased work of breathing; vbg revealed metabolic acidosis requiring 2 amps of bicarb  1/23: Pt remains mechanically intubated on minimal settings.  Tolerating TF's.  Will continue to optimize respiratory status today and plan for SBT on 01/24 with daughter at bedside  1/24:  On minimal vents settings, daughter at bedside for WUA and SBT.  Following commands, EXTUBATED. 1/25: Difficultly managing secretions, required REINTUBATION.  Abdominal wound with worsening dehiscence, Surgery holding off on return to OR unless wound separates further given his frailty 1/26: No SBT today due to reintubation yesterday. Will consult ENT for Tracheostomy given 2 failed extubation attempts.  Off vasopressors. 10/01/23- patient for tracheostomy tommorow.  He has failed 2 extubations.  Electrolytes reivewed and repleted.  Mild AKI noted non essential nephrotoxins reviewed and refined. Discussed with surgery, they think that trache is likely going to prolong suffering only.  Surgery Dr Aleen Campi was able to explain with open abdomen that is not amenable to closure wound dehisence and extravasation will occur and cough from trache will make things worse.  We will discuss all this with family and have GOC conversation.   Interim History / Subjective:  As outlined above under significant events   Objective   Blood pressure (!) 105/57, pulse 85, temperature 98.1 F (36.7 C), temperature source Oral, resp. rate 18, height 5' 10.98" (1.803 m), weight 94 kg, SpO2 100%.    Vent Mode: PRVC FiO2 (%):  [28 %] 28 % Set Rate:  [18 bmp] 18  bmp Vt Set:  [500 mL] 500 mL PEEP:  [5 cmH20] 5 cmH20 Plateau Pressure:  [22 cmH20] 22 cmH20   Intake/Output Summary (Last 24 hours) at 10/30/2023 0839 Last data filed at 10/20/2023 0600 Gross per 24 hour  Intake 1724.76 ml  Output 2395 ml  Net -670.24 ml   Filed Weights   10/04/23 0426 10/05/23 0414 10/29/2023 0500  Weight: 94.3 kg 94 kg 94 kg   Examination: General: Acute on chronically-ill appearing male, mechanically intubated and sedated, in NAD HENT: Atraumatic, normocephalic, neck supple, orally intubated  Lungs: Mechanical breath sounds throughout, even, synchronous with the vent Cardiovascular: NSR, s1s2, no m/r/g, 2+ radial/1+ distal pulses, no edema Abdomen: +BS x4, soft, non distended, colostomy and mucous fistula in left upper quadrant dressing dry/intact, stool in bag dressing dry/intact, abdominal binder in place Extremities: Normal bulk and tone, no deformities Neuro: Sedated, withdraws from pain but currently not following commands, pupils PERRL GU: External catheter draining yellow urine   Resolved Hospital Problem list     Assessment & Plan:   #Acute metabolic encephalopathy-IMPROVED  #Mechanical intubation pain/discomfort  Hx: Seizure history and is on anti epileptic therapy at home - -comfort care only   #Acute hypoxic & hypercapnic respiratory failure #Large volume aspiration #COPD without acute exacerbation #Influenza A infection (present on admission) -comfort care only   #Shock: suspect septic +/- hypovolemic ~ RESOLVED #Chronic HFpEF -comfort care only   #AKI on CKD stage II~RESOLVED #Mild Hypernatremia #BPH -comfort care only  #Aspiration pneumonia #Pseudomonas pneumonia ~ TREATED  #Influenza Infection (POA)~TREATED -comfort care only  #Small bowel obstruction vs ileus #Colostomy irrigation/prolapse #Hx: ogilvie syndrome s/p colostomy -comfort care only  #Anemia =-comfort care only  #Prediabetes --comfort care only    Best  Practice (right click and "Reselect all SmartList Selections" daily)   Diet/type: TF's DVT prophylaxis: Lovenox  GI prophylaxis: PPI Lines: Central line and yes and it is still needed  Foley:  N/A  Code Status:  full code   Labs   CBC: Recent Labs  Lab 10/01/23 0431 10/02/23 0401 10/03/23 0445 10/04/23 0354 10/05/23 0331  WBC 12.3* 12.6* 14.6* 16.9* 16.4*  HGB 7.7* 8.2* 7.7* 7.8* 8.5*  HCT 27.1* 28.0* 26.1* 26.7* 29.3*  MCV 97.5 96.6 97.0 98.2 99.0  PLT 281 316 246 261 253    Basic Metabolic Panel: Recent Labs  Lab 10/01/23 0431 10/01/23 1515 10/01/23 1814 10/02/23 0401 10/03/23 0445 10/04/23 0354 10/05/23 0331  NA 146*  --   --  146* 143 144 147*  K 5.2*   < > 4.6 4.0 5.2* 4.4 4.8  CL 113*  --   --  115* 103 109 109  CO2 24  --   --  26 26 25 27   GLUCOSE 147*  --   --  143* 157* 139* 152*  BUN 55*  --   --  47* 62* 82* 86*  CREATININE 1.26*  --   --  1.04 1.21 1.39* 1.36*  CALCIUM 8.1*  --   --  8.2* 8.2* 8.0* 8.3*  MG 2.4  --   --  2.3 2.4 2.5* 2.4  PHOS 5.8*  --   --  4.2 3.7 3.1 3.5   < > = values in this interval not displayed.   GFR: Estimated Creatinine Clearance: 55.8 mL/min (A) (by C-G formula based on SCr of 1.36 mg/dL (H)). Recent Labs  Lab 10/02/23 0401 10/03/23 0445 10/04/23 0354 10/05/23 0331  WBC 12.6* 14.6* 16.9* 16.4*    Liver Function Tests: Recent Labs  Lab 10/01/23 0431 10/02/23 0401 10/03/23 0445 10/04/23 0354 10/05/23 0331  ALBUMIN 2.2* 2.5* 2.4* 2.3* 2.2*   No results for input(s): "LIPASE", "AMYLASE" in the last 168 hours. No results for input(s): "AMMONIA" in the last 168 hours.  ABG    Component Value Date/Time   PHART 7.47 (H) 09/29/2023 1729   PCO2ART 38 09/29/2023 1729   PO2ART 62 (L) 09/29/2023 1729   HCO3 27.7 09/29/2023 1729   ACIDBASEDEF 0.8 09/27/2023 0813   O2SAT 78.9 10/03/2023 0500  Coagulation Profile: No results for input(s): "INR", "PROTIME" in the last 168 hours.  Cardiac Enzymes: No  results for input(s): "CKTOTAL", "CKMB", "CKMBINDEX", "TROPONINI" in the last 168 hours.  HbA1C: Hgb A1c MFr Bld  Date/Time Value Ref Range Status  05/28/2022 06:17 PM 5.8 (H) 4.8 - 5.6 % Final    Comment:    (NOTE) Pre diabetes:          5.7%-6.4%  Diabetes:              >6.4%  Glycemic control for   <7.0% adults with diabetes   08/09/2019 07:31 AM 5.7 (H) 4.8 - 5.6 % Final    Comment:    (NOTE) Pre diabetes:          5.7%-6.4% Diabetes:              >6.4% Glycemic control for   <7.0% adults with diabetes     CBG: Recent Labs  Lab 10/04/23 2338 10/05/23 0335 10/05/23 0737 10/05/23 1132 10/05/23 1613  GLUCAP 119* 131* 131* 130* 134*    Review of Systems:   Unable to assess due to Intubation/sedation/critical illness  Past Medical History:  He,  has a past medical history of Alcohol abuse, Anemia, Anxiety, Arthritis, Cancer (HCC), COPD (chronic obstructive pulmonary disease) (HCC), Coronary artery disease, Depression, Gout, Hemiplegia and 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 side (HCC), Hypertension, Myocardial infarction (HCC), Neuromuscular disorder (HCC), Ogilvie syndrome, Seizures (HCC), Stroke (HCC), Tremors of nervous system, and Unstable angina (HCC).   Surgical History:   Past Surgical History:  Procedure Laterality Date   CARPAL TUNNEL RELEASE Left 10/19/2015   Procedure: CARPAL TUNNEL RELEASE;  Surgeon: Kennedy Bucker, MD;  Location: [facility] OR[facility];  Service: Orthopedics;  Laterality: Left;   COLONOSCOPY WITH PROPOFOL N/A 10/26/2021   Procedure: COLONOSCOPY WITH PROPOFOL;  Surgeon: Regis Bill, MD;  Location: [facility];  Service: Endoscopy;  Laterality: N/A;   COLONOSCOPY WITH PROPOFOL N/A 06/02/2022   Procedure: COLONOSCOPY WITH PROPOFOL;  Surgeon: Regis Bill, MD;  Location: [facility];  Service: Endoscopy;  Laterality: N/A;   COLONOSCOPY WITH PROPOFOL N/A 06/06/2022   Procedure: COLONOSCOPY WITH  PROPOFOL;  Surgeon: Regis Bill, MD;  Location: [facility];  Service: Endoscopy;  Laterality: N/A;   COLOSTOMY REVISION N/A 09/11/2023   Procedure: COLOSTOMY REVISION;  Surgeon: Henrene Dodge, MD;  Location: [facility];  Service: General;  Laterality: N/A;   JOINT REPLACEMENT     left partial hip    LAPAROSCOPIC SIGMOID COLECTOMY  09/12/2010   Lap hand assisted sigmoidectomy, mobilization splenic flexure -- Dr. Freida Busman   LAPAROTOMY N/A 09/11/2023   Procedure: EXPLORATORY LAPAROTOMY;  Surgeon: Henrene Dodge, MD;  Location: [facility];  Service: General;  Laterality: N/A;   LAPAROTOMY N/A 09/15/2023   Procedure: EXPLORATORY LAPAROTOMY;  Surgeon: Sung Amabile, DO;  Location: [facility];  Service: General;  Laterality: N/A;   LYSIS OF ADHESION N/A 09/11/2023   Procedure: LYSIS OF ADHESION;  Surgeon: Henrene Dodge, MD;  Location: [facility];  Service: General;  Laterality: N/A;   THROAT SURGERY  09/05/2011   cancer     Social History:   reports that he has quit smoking. His smoking use included cigarettes. He has never used smokeless tobacco. He reports that he does not currently use alcohol. He reports that he does not use drugs.   Family History:  His family history includes Cancer in his mother; Hypertension in his father; Leukemia in his brother.   Allergies No Known  Allergies   Home Medications  Prior to Admission medications   Medication Sig Start Date End Date Taking? Authorizing Provider  aspirin 81 MG chewable tablet Take 81 mg by mouth daily.   Yes [provider]  baclofen (LIORESAL) 5 mg TABS tablet Take 5 mg by mouth 2 (two) times daily.   Yes [provider]  diazePAM, 20 MG Dose, (VALTOCO 20 MG DOSE) 2 x 10 MG/0.1ML LQPK Place 20 mg into the nose as needed (seizure lasting over 2 minutes). 08/23/23  Yes Leroy Sea, MD  ipratropium-albuterol (DUONEB) 0.5-2.5 (3) MG/3ML SOLN Inhale 3 mLs into the lungs every 6 (six) hours as needed. 09/02/23  Yes [provider]  lactulose (CHRONULAC) 10 GM/15ML solution Take 20 g by mouth daily. 08/15/23  Yes [provider]  lamoTRIgine (LAMICTAL) 25 MG tablet Take 50 mg by mouth 2 (two) times daily.   Yes [provider]  levETIRAcetam (KEPPRA) 1000 MG tablet Take 1 tablet (1,000 mg total) by mouth 2 (two) times daily. 08/23/23  Yes Leroy Sea, MD  Melatonin 10 MG TABS Take 10 mg by mouth at bedtime.   Yes [provider]  nitroGLYCERIN (NITROSTAT) 0.4 MG SL tablet Place 0.4 mg under the tongue 3 (three) times daily as needed for chest pain. Every 5 minutes up to 3 doses 08/24/23  Yes [provider]  pantoprazole (PROTONIX) 40 MG tablet Take 1 tablet (40 mg total) by mouth daily. 08/23/23  Yes Leroy Sea, MD  polyethylene glycol (MIRALAX / GLYCOLAX) 17 g packet Take 17 g by mouth 2 (two) times daily. 08/19/23  Yes Wieting, Richard, MD  pregabalin (LYRICA) 100 MG capsule Take 200 mg by mouth at bedtime.   Yes [provider]  senna (SENOKOT) 8.6 MG TABS tablet Take 2 tablets (17.2 mg total) by mouth at bedtime as needed for mild constipation or moderate constipation. 06/26/23  Yes Sunnie Nielsen, DO  tamsulosin (FLOMAX) 0.4 MG CAPS capsule Take 0.4 mg by mouth every evening.   Yes [provider]  divalproex (DEPAKOTE) 125 MG DR tablet Take 4 tablets (500 mg total) by mouth 2 (two) times daily for 1 day, THEN 4 tablets (500 mg total) daily for 3 days, THEN 2 tablets (250 mg total) daily for 3 days, THEN 1 tablet (125 mg total) daily for 3 days. Patient not taking: Reported on 09/03/2023 08/23/23 09/03/23  Leroy Sea, MD  fluticasone [facility]) 50 MCG/ACT nasal spray Place 2 sprays into both nostrils daily. Patient not taking: Reported on 09/03/2023 05/15/23   [provider]  Mouthwashes (MOUTH RINSE) LIQD solution 15 mLs by Mouth Rinse route every 2 (two) hours. Patient not taking: Reported on 09/03/2023 08/19/23    Alford Highland, MD  pregabalin (LYRICA) 50 MG capsule Take 50 mg capsule twice daily and 100 mg (two capsules) at bedtime Patient not taking: Reported on 09/03/2023 08/23/23   Leroy Sea, MD  tiotropium (SPIRIVA) 18 MCG inhalation capsule Place 18 mcg into inhaler and inhale daily. Patient not taking: Reported on 09/03/2023    [provider]     Critical care provider statement:  Total critical care time: 64 minutes   Performed by: Karna Christmas MD   Critical care time was exclusive of separately billable procedures and treating other patients.   Critical care was necessary to treat or prevent imminent or life-threatening deterioration.   Critical care was time spent personally by me on the following activities: development of treatment  plan with patient and/or surrogate as well as nursing, discussions with consultants, evaluation of patient's response to treatment, examination of patient, obtaining history from patient or surrogate, ordering and performing treatments and interventions, ordering and review of laboratory studies, ordering and review of radiographic studies, pulse oximetry and re-evaluation of patient's condition.    Vida Rigger, M.D.  Pulmonary & Critical Care Medicine

## 2023-10-06 NOTE — Progress Notes (Signed)
Extubated to comfort care per order.

## 2023-10-06 NOTE — Progress Notes (Signed)
Daily Progress Note   [name]       Date: 10/12/2023 DOB: 05/07/1951  Age: 73 y.o. MRN#: [id number] Attending Physician: Vida Rigger, MD Primary Care Physician: Keane Police, MD Admit Date: 09/03/2023  Reason for Consultation/Follow-up: Establishing goals of care  HPI/Brief Hospital Review: Calvin [name] is a 73 y.o. 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   Was admitted with Sepsis due to Influenza A infection, Aspiration pneumonia, and Small Bowel Obstruction. 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 Course further complicated by Aspiration event requiring intubation and mechanical ventilation on 1/17. Has been unable to tolerate extubation on 2 occasions due to inability to manage secretions and protect his airway.   Requested to engage by CCM team 2/1  Palliative Medicine consulted for assisting with goals of care conversations.  Subjective: Extensive chart review has been completed prior to meeting patient including labs, vital signs, imaging, progress notes, orders, and available advanced directive documents from current and previous encounters.    Met at bedside in collaboration with Mr. [name] family including his daughter Rogers Seeds), sister in law, brother and multiple grandchildren. Medical updates provided to family from Dr.  Karna Christmas. He also discussed recommendation on transitioning to comfort measures with family. Family present in agreement.  We discussed [name] would no longer receive aggressive medical interventions such as continuous vital signs, lab work, radiology testing, or medications not focused on comfort. All care would focus on how the patient is looking and feeling. This would include management of any symptoms that may cause discomfort, pain, shortness of breath, cough, nausea, agitation, anxiety, and/or secretions etc. Symptoms would be managed with medications and other non-pharmacological interventions such as spiritual support if requested, repositioning, music therapy, or therapeutic listening. Family verbalized understanding and appreciation.   At time of initial visit, second daugher-[name] on her way to visit bedside.  Returned to bedside once [facility] arrived. She as well as all other family present confirm  their desire to transition to comfort measures. Provided education on process f transitioning to comfort measures.  [name] requiring hydromorphone and midazolam infusion prior to transition, spoke to family regarding need for these medications to maintain comfort for which they agreed.  Orders placed to reflect comfort care Hydromorphone infusion and PRN bolus for pain/air hunger/comfort Robinul PRN for excessive secretions Versed infusion and PRN bolus for agitation/anxiety Zofran PRN for nausea Liquifilm tears PRN for dry eyes Haldol PRN for agitation/anxiety May have comfort feeding Comfort cart for family Unrestricted visitations in the setting of EOL (per policy) Oxygen PRN 2L or less for comfort. No escalation.    Answered and addressed all questions and concerns. PMT to continue to follow for ongoing needs and support.  Care plan was discussed with CCM team and nursing staff.  Thank you for allowing the Palliative Medicine Team to assist in the care of this  patient.  Total time:  50 minutes  Time spent includes: Detailed review of medical records (labs, imaging, vital signs), medically appropriate exam (mental status, respiratory, cardiac, skin), discussed with treatment team, counseling and educating patient, family and staff, documenting clinical information, medication management and coordination of care.  Leeanne Deed, DNP, AGNP-C Palliative Medicine   Please contact Palliative Medicine Team phone at [phone] for questions and concerns.

## 2023-10-06 NOTE — Progress Notes (Signed)
Palliative and MD meeting with family. Family transitioning to comfort care once second daughter arrives. Hold medications per palliative.   Patient transitioned to comfort care per family request. PRN medications given for patient comfort per NP. Patient extubated, family at bedside.  Visitor policy explained to family.  PRNs given per orders.

## 2023-10-06 NOTE — Progress Notes (Signed)
   10/13/2023 1300  Spiritual Encounters  Type of Visit Follow up  Care provided to: Family  Reason for visit End-of-life  OnCall Visit Yes   Chaplain provided follow-up care after extubation.

## 2023-10-06 NOTE — Plan of Care (Signed)
  Problem: Clinical Measurements: Goal: Will remain free from infection Outcome: Not Progressing Goal: Diagnostic test results will improve Outcome: Not Progressing   Problem: Elimination: Goal: Will not experience complications related to bowel motility Outcome: Not Progressing Goal: Will not experience complications related to urinary retention Outcome: Not Progressing Problem: Skin Integrity: Goal: Risk for impaired skin integrity will decrease Outcome: Not Progressing     Problem: Safety: Goal: Ability to remain free from injury will improve Outcome: Not Progressing

## 2023-10-06 DEATH — deceased

## 2023-10-08 LAB — GLUCOSE, CAPILLARY
Glucose-Capillary: 114 mg/dL — ABNORMAL HIGH (ref 70–99)
Glucose-Capillary: 138 mg/dL — ABNORMAL HIGH (ref 70–99)
Glucose-Capillary: 144 mg/dL — ABNORMAL HIGH (ref 70–99)
Glucose-Capillary: 141 mg/dL — ABNORMAL HIGH (ref 70–99)

## 2023-11-03 NOTE — Progress Notes (Signed)
Patient expired at 2345. Patient is without lung sounds, respirations and has no pulse; pupils are fixed and dilated. Verified by 2nd RN Shela Leff, RN Family is at the bedside and has requested to use [facility]. Webb Silversmith, NP notified.

## 2023-11-03 NOTE — Death Summary Note (Signed)
DEATH SUMMARY   Patient Details  Name: Calvin [name] MRN: [id number] DOB: 1950-11-25  Admission/Discharge Information   Admit Date:  09-10-23  Date of Death: Date of Death: 10-13-23  Time of Death: Time of Death: 2343/10/22  Length of Stay: 2032-10-21  Referring Physician: Keane Police, MD   Reason(s) for Hospitalization  Aspiration Pneumonia  Diagnoses  Preliminary cause of death:  Acute respiratory failure with hypoxia (HCC) due to large volume aspiration pneumonia, Influenza A, AND  severe sepsis due to Pneumonia, influenza A and intrabdominal complication from wound dehiscence. Active Problems:   Seizure (HCC)   Essential hypertension   CAD (coronary artery disease)   COPD (chronic obstructive pulmonary disease) (HCC)   Ogilvie syndrome   Sepsis (HCC)   Chronic diastolic CHF (congestive heart failure) (HCC)   Paroxysmal atrial flutter (HCC)   History of CVA (cerebrovascular accident)   Colostomy prolapse (HCC)   Small bowel obstruction due to adhesions (HCC)   Aspiration into airway Secondary Diagnoses (including complications and co-morbidities):  Principal Problem:   Acute respiratory failure with hypoxia (HCC) Active Problems:   Seizure (HCC)   Essential hypertension   CAD (coronary artery disease)   COPD (chronic obstructive pulmonary disease) (HCC)   Ogilvie syndrome   Sepsis (HCC)   Chronic diastolic CHF (congestive heart failure) (HCC)   Paroxysmal atrial flutter (HCC)   History of CVA (cerebrovascular accident)   Colostomy prolapse (HCC)   Small bowel obstruction due to adhesions [facility])   Aspiration into airway   Brief Hospital Course (including significant findings, care, treatment, and services provided and events leading to death)  Calvin [name] is a 73 y.o. year old male who On 09-Sep-2024 admitted to hospitalist service with sepsis, AKI, (+) Influenza A, likely aspiration pneumonia, acute hypoxic resp failure and SBO. NG placed, surgical consulted. On  12/31-01/06 he remained relatively stable, remained SBO w/ NG and on TPN, worsening colostomy prolapse. Anemia w/o overt GIB, holding anti-plt/anti-cogs, GI no plans for endoscopic eval at that time. On 01/07: exlap, LOA, and colostomy revision. On 01/09: d/c NG. On 01/10: advancing diet as able. Hgb dropped to 6.8 - 1 unit PRBC given. On 01/11: persistent abd pain and cough, imaging concerning for abdominal surgical wound dehiscence deeper layers, going back to OR. Continue w/ cough suppression, await over-read but no obvious CT chest findings, suspect post-viral bronchitis. On 01/12: to OR early AM for closure of wound dehiscence. Postop stable, NG in place. CT chest question pneumonia but pt reports coughing improved, suspect was mostly reflux, will continue to monitor cough. Postop care per general surgery include clamp NG and remained NPO. On 01/13: NG out, CLD. On 01/14: FLD, continue advancing diet tolerated. On 1/15: Advanced to dysphagia diet after ST eval.  Discontinue telemetry, palliative care consult, DC central line. On 1/16: Underwent CT abdomen/pelvis last evening.  Not a candidate for another surgery due to poor functional and nutritional status and multiple recent procedures.  Family deciding on goals of care/hospice. On 1/17: Aspirated this morning with development of severe respiratory distress and hypoxia requiring emergent intubation.  Now with developing septic shock.  Pt's daughter requesting transfer to [facility] or [facility].  Dr. Aleen Campi spoke both facilities which are at capacity and not accepting transfers. On 1/18: Pt remains mechanically intubated overnight required increased dose of fentanyl and vecuronium x1 dose due to vent dyssynchrony.  Pt currently unable to follow commands RASS goal -4.  Will perform WUA and for possible SBT.  Pt requiring  levophed gtt @5  mcg/min and vasopressin 0.03 units/min to maintain map 65 or higher.  Pt extubated to Bipap able to follow commands. On 1/19: Pt with  increased work of breathing due to increased secretions despite scheduled hypertonic saline, Chest PT, and Bipap requiring reintubation. On 1/20: On minimal vent settings, will keep intubated today as required reintubation yesterday.  Off Vasopressors. KUB with improving ileus, General Surgery ok with starting trickle feeds. On 1/21: On minimal vent settings.  Daughter at bedside for SBT, FAILED SBT due to tachypnea and increased WOB. On 1/22: Pt remains mechanically intubated on PRVC vent settings PEEP 5/FiO2 30% with increased work of breathing; vbg revealed metabolic acidosis requiring 2 amps of bicarb. On 1/23: Pt remains mechanically intubated on minimal settings.  Tolerating TF's.  Will continue to optimize respiratory status today and plan for SBT on 01/24 with daughter at bedside. On 1/24:  On minimal vents settings, daughter at bedside for WUA and SBT.  Following commands, EXTUBATED. On 1/25: Difficultly managing secretions, required REINTUBATION.  Abdominal wound with worsening dehiscence, Surgery holding off on return to OR unless wound separates further given his frailty. On 1/26: No SBT today due to reintubation yesterday. Will consult ENT for Tracheostomy given 2 failed extubation attempts.  Off vasopressors. On 10/01/23- plan for tracheostomy on 1/28 due to failed 2 extubations. Plan discussed with surgery, they think that trache is likely going to prolong suffering only.  Surgery Dr Aleen Campi was able to explain with open abdomen that is not amenable to closure wound dehisence and extravasation will occur and cough from trache will make things worse.  We will discuss all this with family and have GOC conversation.Overnight  on 1/28 went into Afib with RVR, milrinone and levo stopped started on Cardizem gtt. Trach cancelled. On 01/31 palliative met with family and plan discussed to transition to full comfort care measures only. On 02/01 patient was terminally extubated and transitioned to comfort care,  he passed away later at 2345 pm.   Pertinent Labs and Studies  Significant Diagnostic Studies DG Chest Port 1 View Result Date: 10/01/2023 CLINICAL DATA:  [id number].  Ileus. [id number].  Acute respiratory failure with hypoxia. EXAM: PORTABLE CHEST 1 VIEW PORTABLE ABDOMEN 1 VIEW COMPARISON:  Chest and abdomen films 09/29/2023. FINDINGS: Chest AP portable 4:58 a.m.: ETT interval pullback to 4.5 cm from carina. NGT extends into the stomach with the tip in the antrum of stomach. Right PICC tip remains at the superior cavoatrial junction. There are low lung volumes with increased dense left lower lobe consolidation. Unchanged small left pleural effusion. Left mid perihilar opacities are mildly improved, with remaining lungs generally clear. There is mild cardiomegaly. No vascular congestion findings. Stable mediastinum. No new osseous finding. Supine abdomen 5:05 a.m.: Overlying skin staples are still seen. The bowel pattern is nonobstructive. Scattered contrast remains in the left-sided colon. No dilated bowel is seen on the current exam. This could indicate resolution of previous bowel dilatation or fluid filling. There is no supine evidence of free air. No pathologic calcification or other significant findings. 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 IMPRESSION: 1. ETT interval pullback to 4.5 cm from carina. 2. NGT tip in the antrum of stomach. 3. Right PICC tip remains at the superior cavoatrial junction. 4. Low lung volumes with increased dense left lower lobe consolidation. 5. Unchanged small left pleural effusion. 6. Left mid perihilar opacities are mildly improved. 7. Nonobstructive bowel pattern. Scattered contrast remains in the left-sided colon. 8. No  dilated bowel is seen on the current exam. This could indicate resolution of previous bowel dilatation or fluid filling. Electronically Signed   By: Almira Bar M.D.   On: 10/01/2023 07:58   DG Abd 1 View Result Date:  10/01/2023 CLINICAL DATA:  [id number].  Ileus. [id number].  Acute respiratory failure with hypoxia. EXAM: PORTABLE CHEST 1 VIEW PORTABLE ABDOMEN 1 VIEW COMPARISON:  Chest and abdomen films 09/29/2023. FINDINGS: Chest AP portable 4:58 a.m.: ETT interval pullback to 4.5 cm from carina. NGT extends into the stomach with the tip in the antrum of stomach. Right PICC tip remains at the superior cavoatrial junction. There are low lung volumes with increased dense left lower lobe consolidation. Unchanged small left pleural effusion. Left mid perihilar opacities are mildly improved, with remaining lungs generally clear. There is mild cardiomegaly. No vascular congestion findings. Stable mediastinum. No new osseous finding. Supine abdomen 5:05 a.m.: Overlying skin staples are still seen. The bowel pattern is nonobstructive. Scattered contrast remains in the left-sided colon. No dilated bowel is seen on the current exam. This could indicate resolution of previous bowel dilatation or fluid filling. There is no supine evidence of free air. No pathologic calcification or other significant findings. 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 IMPRESSION: 1. ETT interval pullback to 4.5 cm from carina. 2. NGT tip in the antrum of stomach. 3. Right PICC tip remains at the superior cavoatrial junction. 4. Low lung volumes with increased dense left lower lobe consolidation. 5. Unchanged small left pleural effusion. 6. Left mid perihilar opacities are mildly improved. 7. Nonobstructive bowel pattern. Scattered contrast remains in the left-sided colon. 8. No dilated bowel is seen on the current exam. This could indicate resolution of previous bowel dilatation or fluid filling. Electronically Signed   By: Almira Bar M.D.   On: 10/01/2023 07:58   DG Abd Portable 1V Result Date: 09/29/2023 CLINICAL DATA:  Orogastric tube placement. EXAM: PORTABLE ABDOMEN - 1 VIEW COMPARISON:  Earlier today. FINDINGS: Interval  orogastric tube with its side hole in the proximal stomach and tip in the proximal to mid stomach. Normal bowel-gas pattern. Stable left hip prosthesis and abdominal and pelvic skin clips and retention sutures. IMPRESSION: Orogastric tube in good position. Electronically Signed   By: Beckie Salts M.D.   On: 09/29/2023 18:26   DG Chest Port 1 View Result Date: 09/29/2023 CLINICAL DATA:  Respiratory failure.  Orogastric tube placement EXAM: PORTABLE CHEST 1 VIEW COMPARISON:  09/23/2023 FINDINGS: Endotracheal tube tip at the orifice of the right mainstem bronchus. Is recommended that this be retracted 5.5 cm. Orogastric tube extending into the stomach with its side hole in the proximal stomach and tip not included. Right PICC tip in the superior vena cava approximately 4.5 cm superior to the superior cavoatrial junction. Normal sized heart. Dense left lower lobe airspace opacity with minimal improvement. Interval patchy and linear density in the more lateral aspects of the left lung base staple patchy and linear densities at the right lung base unremarkable bones. IMPRESSION: 1. Endotracheal tube tip at the orifice of the right mainstem bronchus. It is recommended that this be retracted 5.5 cm. 2. Minimal improvement in dense left lower lobe pneumonia or atelectasis. 3. Interval patchy and linear densities in the more lateral aspects of the left lung base, likely atelectasis or a combination of atelectasis and pneumonia. 4. Stable right basilar atelectasis. Electronically Signed   By: Beckie Salts M.D.   On: 09/29/2023 18:25   DG Abd  1 View Result Date: 09/29/2023 CLINICAL DATA:  Ileus. EXAM: ABDOMEN - 1 VIEW COMPARISON:  09/26/2023 FINDINGS: Short segment gas distended bowel the central abdomen measures about 4.7 cm diameter. Otherwise no gas dilated bowel loops evident. Contrast material is seen in a nondilated left colon. Retention sutures overlie the midline. Bones are demineralized. Status post left hip  replacement. IMPRESSION: Short segment gas distended bowel in the central abdomen measures about 4.7 cm diameter. Imaging features could be compatible with ileus. Electronically Signed   By: Kennith Center M.D.   On: 09/29/2023 08:53   DG Abd 1 View Result Date: 09/26/2023 CLINICAL DATA:  Abdominal distention EXAM: ABDOMEN - 1 VIEW COMPARISON:  X-ray 09/24/2023. FINDINGS: Again midline skin staples are seen with retention sutures. Left lower quadrant ostomy. There are some contrast along the decompressed loops of the large bowel in the left midabdomen and pelvis. Relatively gasless abdomen overall. Enteric tube in place with the tip overlying the body of the stomach. IMPRESSION: Ostomy.  Enteric tube. Postsurgical changes. Near gasless abdomen which is a nonspecific finding. Loops seen opacified with contrast and with the air are nondilated. Additional workup as clinically appropriate. Electronically Signed   By: Karen Kays M.D.   On: 09/26/2023 14:15   DG Abd Portable 1V Result Date: 09/24/2023 CLINICAL DATA:  Ileus EXAM: PORTABLE ABDOMEN - 1 VIEW COMPARISON:  the previous day's study FINDINGS: Feeding tube remains in the decompressed stomach. A ostomy device left mid abdomen. There is a single gas distended small bowel loop in the right lower quadrant. Remainder of bowel nondistended. Midline skin staples. Left hip arthroplasty components partially visualized. Patchy iliofemoral arterial calcifications. Left retrocardiac consolidation/atelectasis. IMPRESSION: Feeding tube in decompressed stomach. Nonobstructive bowel gas pattern. Electronically Signed   By: Corlis Leak M.D.   On: 09/24/2023 14:38   DG Abd 1 View Result Date: 09/23/2023 CLINICAL DATA:  Orogastric tube placement. EXAM: ABDOMEN - 1 VIEW COMPARISON:  09/22/2023. FINDINGS: Nasal/orogastric tube tip projects in the left mid abdomen, well within the stomach. Postsurgical changes along the anterior abdominal are stable. IMPRESSION:  Well-positioned nasal/orogastric tube. Electronically Signed   By: Amie Portland M.D.   On: 09/23/2023 08:54   DG Chest Port 1 View Result Date: 09/23/2023 CLINICAL DATA:  Intubation EXAM: PORTABLE CHEST 1 VIEW COMPARISON:  Earlier today FINDINGS: New endotracheal tube with tip just below the clavicular heads. An enteric tube reaches the stomach. Right PICC with tip at the SVC. Opacification of the lower lungs with patchy reticular density in the bilateral mid to upper zones. No convincing change from chest CT 09/15/2023. Small volume pleural fluid is possible on the left. Normal heart size. Clear apical lungs IMPRESSION: 1. New hardware in unremarkable position. 2. Stable aeration. Electronically Signed   By: Tiburcio Pea M.D.   On: 09/23/2023 08:52   DG Chest Port 1 View Result Date: 09/23/2023 CLINICAL DATA:  Acute respiratory failure. EXAM: PORTABLE CHEST 1 VIEW COMPARISON:  09/22/2023 FINDINGS: Right-sided PICC line unchanged. Lungs are adequately inflated with stable patchy hazy opacification over the right lung worse in the right base. Stable hazy opacification over the left mid to lower lung/retrocardiac region. Possible small amount left pleural fluid. Cardiomediastinal silhouette and remainder of the exam is unchanged. IMPRESSION: Stable patchy bilateral multifocal airspace process worse in the lung bases likely multifocal infection. Electronically Signed   By: Elberta Fortis M.D.   On: 09/23/2023 08:02   ECHOCARDIOGRAM COMPLETE Result Date: 09/22/2023    ECHOCARDIOGRAM REPORT  Patient Name:   YEISON SIPPEL Date of Exam: 09/22/2023 Medical Rec #:  [id number]      Height:       71.0 in Accession #:    [id number]     Weight:       203.3 lb Date of Birth:  Sep 16, 1950      BSA:          2.123 m Patient Age:    72 years       BP:           90/55 mmHg Patient Gender: M              HR:           85 bpm. Exam Location:  [facility] Procedure: 2D Echo and Intracardiac Opacification Agent Indications:     Shock  R57.9  History:         Patient has prior history of Echocardiogram examinations, most                  recent 06/01/2022.  Sonographer:     Overton Mam RDCS, FASE Referring Phys:  [id number] Ezequiel Essex Diagnosing Phys: Armanda Magic MD  Sonographer Comments: Technically difficult study due to poor echo windows, suboptimal apical window and echo performed with patient supine and on artificial respirator. Image acquisition challenging due to respiratory motion. IMPRESSIONS  1. Left ventricular ejection fraction, by estimation, is 50%. The left ventricle has low normal function. The left ventricle demonstrates global hypokinesis. Left ventricular diastolic parameters are indeterminate.  2. Right ventricular systolic function is mildly reduced. The right ventricular size is moderately enlarged. There is mildly elevated pulmonary artery systolic pressure. The estimated right ventricular systolic pressure is 40.7 mmHg.  3. Right atrial size was moderately dilated.  4. Large pleural effusion in the left lateral region.  5. The mitral valve is normal in structure. Trivial mitral valve regurgitation. No evidence of mitral stenosis.  6. The aortic valve is normal in structure. Aortic valve regurgitation is trivial. No aortic stenosis is present.  7. Aortic dilatation noted. There is mild dilatation of the aortic root, measuring 41 mm.  8. The inferior vena cava is normal in size with <50% respiratory variability, suggesting right atrial pressure of 8 mmHg. FINDINGS  Left Ventricle: Left ventricular ejection fraction, by estimation, is 50%. The left ventricle has low normal function. The left ventricle demonstrates global hypokinesis. Definity contrast agent was given IV to delineate the left ventricular endocardial  borders. The left ventricular internal cavity size was normal in size. There is no left ventricular hypertrophy. Left ventricular diastolic parameters are indeterminate. Normal left ventricular filling  pressure. Right Ventricle: The right ventricular size is moderately enlarged. No increase in right ventricular wall thickness. Right ventricular systolic function is mildly reduced. There is mildly elevated pulmonary artery systolic pressure. The tricuspid regurgitant velocity is 2.86 m/s, and with an assumed right atrial pressure of 8 mmHg, the estimated right ventricular systolic pressure is 40.7 mmHg. Left Atrium: Left atrial size was normal in size. Right Atrium: Right atrial size was moderately dilated. Pericardium: There is no evidence of pericardial effusion. Mitral Valve: The mitral valve is normal in structure. Trivial mitral valve regurgitation. No evidence of mitral valve stenosis. Tricuspid Valve: The tricuspid valve is normal in structure. Tricuspid valve regurgitation is trivial. No evidence of tricuspid stenosis. Aortic Valve: The aortic valve is normal in structure. Aortic valve regurgitation is trivial. No aortic stenosis is present. Aortic valve peak gradient  measures 3.8 mmHg. Pulmonic Valve: The pulmonic valve was normal in structure. Pulmonic valve regurgitation is not visualized. No evidence of pulmonic stenosis. Aorta: Aortic dilatation noted. There is mild dilatation of the aortic root, measuring 41 mm. Venous: The inferior vena cava is normal in size with less than 50% respiratory variability, suggesting right atrial pressure of 8 mmHg. IAS/Shunts: No atrial level shunt detected by color flow Doppler. Additional Comments: There is a large pleural effusion in the left lateral region.  LEFT VENTRICLE PLAX 2D LVIDd:         4.70 cm     Diastology LVIDs:         3.50 cm     LV e' medial:    6.53 cm/s LV PW:         1.10 cm     LV E/e' medial:  11.7 LV IVS:        1.00 cm     LV e' lateral:   12.90 cm/s LVOT diam:     2.10 cm     LV E/e' lateral: 5.9 LV SV:         53 LV SV Index:   25 LVOT Area:     3.46 cm  LV Volumes (MOD) LV vol d, MOD A2C: 92.6 ml LV vol d, MOD A4C: 98.1 ml LV vol s, MOD  A2C: 35.5 ml LV vol s, MOD A4C: 48.0 ml LV SV MOD A2C:     57.1 ml LV SV MOD A4C:     98.1 ml LV SV MOD BP:      57.0 ml RIGHT VENTRICLE RV Basal diam:  4.60 cm RV S prime:     12.20 cm/s TAPSE (M-mode): 2.2 cm LEFT ATRIUM           Index        RIGHT ATRIUM           Index LA diam:      2.20 cm 1.04 cm/m   RA Area:     24.00 cm LA Vol (A4C): 24.7 ml 11.63 ml/m  RA Volume:   81.00 ml  38.15 ml/m  AORTIC VALVE                 PULMONIC VALVE AV Area (Vmax): 2.91 cm     PV Vmax:        0.69 m/s AV Vmax:        97.70 cm/s   PV Peak grad:   1.9 mmHg AV Peak Grad:   3.8 mmHg     RVOT Peak grad: 2 mmHg LVOT Vmax:      82.20 cm/s LVOT Vmean:     54.100 cm/s LVOT VTI:       0.154 m  AORTA Ao Root diam: 4.10 cm Ao Asc diam:  3.70 cm MITRAL VALVE               TRICUSPID VALVE MV Area (PHT): 3.99 cm    TR Peak grad:   32.7 mmHg MV Decel Time: 190 msec    TR Vmax:        286.00 cm/s MV E velocity: 76.30 cm/s MV A velocity: 65.10 cm/s  SHUNTS MV E/A ratio:  1.17        Systemic VTI:  0.15 m                            Systemic Diam: 2.10 cm Armanda Magic MD Electronically  signed by Armanda Magic MD Signature Date/Time: 09/22/2023/2:48:53 PM    Final    DG Chest Port 1 View Result Date: 09/22/2023 CLINICAL DATA:  73 year old male with history of acute respiratory failure with hypoxia. EXAM: PORTABLE CHEST 1 VIEW COMPARISON:  Chest x-ray 09/21/2023. FINDINGS: An endotracheal tube is in place with tip 4.1 cm above the carina. There is a right upper extremity PICC with tip terminating in the distal superior vena cava. A nasogastric tube is seen extending into the stomach, however, the tip of the nasogastric tube extends below the lower margin of the image. Lung volumes are low. Widespread but patchy areas of interstitial prominence an ill-defined opacities are scattered throughout the lungs bilaterally in a patchy distribution. Small left pleural effusion. No right pleural effusion. Dense opacity in the medial aspect of the left  lung base favored to reflect a focal area of atelectasis and/or consolidation. No pneumothorax. No evidence of pulmonary edema. Heart size is normal. The patient is rotated to the left on today's exam, resulting in distortion of the mediastinal contours and reduced diagnostic sensitivity and specificity for mediastinal pathology. Atherosclerotic calcifications in the thoracic aorta. IMPRESSION: 1. Support apparatus, as above. 2. The appearance of the chest is concerning for multilobar bilateral pneumonia, as above. 3. Small left pleural effusion. 4. Aortic atherosclerosis. Electronically Signed   By: Trudie Reed M.D.   On: 09/22/2023 07:36   DG Abd 1 View Result Date: 09/22/2023 CLINICAL DATA:  73 year old male with history of abdominal distention. EXAM: ABDOMEN - 1 VIEW COMPARISON:  Multiple priors, most recently 09/21/2023. FINDINGS: Nasogastric tube in position with tip either in the antral pre-pyloric region of the stomach where the proximal duodenal bulb. Midline surgical staples are again noted. Multiple dilated loops of gas-filled small bowel are again noted throughout the abdomen, largest of which measures up to 4.9 cm in the right lower quadrant. Some gas, stool and oral contrast material is noted in the colon, although the amount of gas and stool in the colon is very low. No definite pneumoperitoneum. A small amount of residual oral contrast material is also noted in the proximal stomach. IMPRESSION: 1. Support apparatus and postoperative changes, as above. 2. Bowel-gas pattern remains concerning for potential small bowel obstruction, although ileus is not entirely excluded. Electronically Signed   By: Trudie Reed M.D.   On: 09/22/2023 07:34   [place] EKG SITE RITE Result Date: 09/21/2023 If Site Rite image not attached, placement could not be confirmed due to current cardiac rhythm.  DG Abd 1 View Result Date: 09/21/2023 CLINICAL DATA:  73 year old male status post orogastric tube placement.  EXAM: ABDOMEN - 1 VIEW COMPARISON:  Abdominal radiograph 09/13/2023. FINDINGS: Tip of enteric tube is in the antral region of the stomach. Numerous dilated loops of gas-filled small bowel are noted throughout the abdomen, measuring up to approximately 4.4 cm in diameter. Oral contrast material in the stomach and descending colon. Skin staples in the midline. No definite pneumoperitoneum confidently identified. IMPRESSION: 1. Postoperative changes and support apparatus, as above. 2. Abnormal bowel-gas pattern suspicious for small bowel obstruction, although ileus is not excluded. Electronically Signed   By: Trudie Reed M.D.   On: 09/21/2023 07:24   DG Chest Port 1 View Result Date: 09/21/2023 CLINICAL DATA:  74 year old male status post orogastric tube placement. EXAM: PORTABLE CHEST 1 VIEW COMPARISON:  Chest x-ray 09/11/2023. FINDINGS: An endotracheal tube is in place with tip 3.6 cm above the carina. A nasogastric tube is seen extending into  the stomach, however, the tip of the nasogastric tube extends below the lower margin of the image. Lung volumes are low. Patchy areas of interstitial prominence an ill-defined opacities scattered throughout the lungs bilaterally, most severe throughout the lower lungs (left-greater-than-right), concerning for developing multilobar bilateral bronchopneumonia. No definite pleural effusions. No pneumothorax. No evidence of pulmonary edema. Heart size appears normal. Mediastinal contours are distorted by patient positioning. Small amount of oral contrast material in the stomach incidentally noted. IMPRESSION: 1. Support apparatus, as above. 2. Multilobar bilateral bronchopneumonia, progressive compared to the prior study. Electronically Signed   By: Trudie Reed M.D.   On: 09/21/2023 07:21   CT ABDOMEN PELVIS WO CONTRAST Result Date: 09/19/2023 CLINICAL DATA:  Postoperative abdominal pain. EXAM: CT ABDOMEN AND PELVIS WITHOUT CONTRAST TECHNIQUE: Multidetector CT  imaging of the abdomen and pelvis was performed following the standard protocol without IV contrast. RADIATION DOSE REDUCTION: This exam was performed according to the departmental dose-optimization program which includes automated exposure control, adjustment of the mA and/or kV according to patient size and/or use of iterative reconstruction technique. COMPARISON:  CT of the chest abdomen pelvis dated 09/15/2023. FINDINGS: Evaluation of this exam is limited in the absence of intravenous contrast. Lower chest: There are bibasilar consolidative changes which may represent atelectasis or infiltrate. Coronary vascular calcification of the LAD noted. No intra-abdominal free air or free fluid. Hepatobiliary: The liver is unremarkable. Layering stones within the gallbladder. No pericholecystic fluid or evidence of acute cholecystitis by CT. Pancreas: Unremarkable. No pancreatic ductal dilatation or surrounding inflammatory changes. Spleen: Normal in size without focal abnormality. Adrenals/Urinary Tract: Similar appearance of bilateral adrenal nodules, previously characterized as adenoma. There is no hydronephrosis or nephrolithiasis on either side. The visualized ureters and urinary bladder appear unremarkable. Stomach/Bowel: Postsurgical changes of the bowel with a left lower quadrant loop colostomy. Dilated small bowel loops in the mid to lower abdomen measure up to 4.2 cm in caliber. There is air-fluid level throughout the small bowel loops. There is abutment of small bowel to the anterior peritoneal wall in the midline. Although findings may represent postsurgical ileus, obstruction is not excluded. Follow-up with small-bowel series recommended. The appendix is normal. Vascular/Lymphatic: Mild aortoiliac atherosclerotic disease. The IVC is unremarkable. No portal venous gas. There is no adenopathy. Reproductive: The prostate and seminal vesicles are grossly unremarkable. No pelvic mass. Other: Postsurgical changes  of ventral hernia repair. Musculoskeletal: Total left hip arthroplasty. Osteopenia with degenerative changes. No acute osseous pathology. IMPRESSION: 1. Dilated small bowel loops favored to represent postsurgical ileus. Developing obstruction is not excluded. Follow-up with small-bowel series recommended. 2. Status post ventral hernia repair. 3. Postsurgical changes of the bowel with a left lower quadrant loop colostomy. 4. Cholelithiasis. 5. Bibasilar atelectasis or infiltrate. 6.  Aortic Atherosclerosis (ICD10-I70.0). Electronically Signed   By: Elgie Collard M.D.   On: 09/19/2023 18:13   CT CHEST ABDOMEN PELVIS W CONTRAST Result Date: 09/15/2023 CLINICAL DATA:  Abdominal pain, postop. Respiratory distress. Electronic records indicates recent laparotomy with lysis of adhesions and colostomy revision. EXAM: CT CHEST, ABDOMEN, AND PELVIS WITH CONTRAST TECHNIQUE: Multidetector CT imaging of the chest, abdomen and pelvis was performed following the standard protocol during bolus administration of intravenous contrast. RADIATION DOSE REDUCTION: This exam was performed according to the departmental dose-optimization program which includes automated exposure control, adjustment of the mA and/or kV according to patient size and/or use of iterative reconstruction technique. CONTRAST:  OMNIPAQUE IOHEXOL 300 MG/ML  SOLN COMPARISON:  Abdominopelvic CT 09/06/2023 FINDINGS:  CT CHEST FINDINGS Cardiovascular: Right internal jugular central line tip is in the right atrium. Right upper extremity PICC tip is in the mid SVC. The heart is mildly enlarged. There are coronary artery calcifications. Aortic atherosclerosis without aneurysm or acute aortic findings. Mediastinum/Nodes: The esophagus is diffusely dilated and fluid-filled to the thoracic inlet. No mediastinal or hilar adenopathy. Lungs/Pleura: Right lower lobe consolidation with central air bronchograms, similar to prior. There are adjacent nodular densities in  the right middle lobe. Dependent density in the left lower lobe is similar. Small right and trace left pleural effusion. Breathing motion artifact limits detailed assessment. No features of pulmonary edema. No intraluminal debris. Musculoskeletal: Avascular necrosis of both humeral heads. Subchondral collapse on the left. Remote medial left clavicle fracture. CT ABDOMEN PELVIS FINDINGS Hepatobiliary: No focal liver lesion. Punctate hepatic granuloma. Layering gallstones in the gallbladder. No pericholecystic inflammation. There is no biliary dilatation. Pancreas: No ductal dilatation or inflammation. Spleen: Normal in size without focal abnormality. Adrenals/Urinary Tract: Unchanged bilateral adrenal nodules, adenomas based on prior imaging. No imaging follow-up is recommended. Stable lobulated renal contours of both kidneys. No hydronephrosis. No suspicious renal lesion. There is mild diffuse bladder wall thickening. Stomach/Bowel: The stomach is prominently distended with air and fluid. Dilated fluid-filled duodenum and small bowel. There is no discrete small bowel transition point. Midline ventral abdominal wall hernia contains loops of small bowel, without associated small bowel wall thickening. Normal appendix is visualized. Fluid/liquid stool within the cecum and ascending colon. Transverse colon is decompressed. Left lower quadrant colostomy. There is retained barium within the excluded descending and sigmoid colon. Vascular/Lymphatic: Aortic and branch atherosclerosis. No aneurysm. The portal vein is patent. No enlarged lymph nodes in the abdomen or pelvis. Reproductive: Unremarkable prostate. Other: Laparotomy changes with midline skin staples. Rectus diastasis with midline ventral abdominal wall hernia containing small bowel that is not inflamed. There is generalized body wall edema. No significant ascites or free fluid. No abdominopelvic collection. No free air. Musculoskeletal: Left hip arthroplasty.  There are no acute or suspicious osseous abnormalities. IMPRESSION: 1. Diffusely dilated fluid-filled esophagus to the thoracic inlet. The stomach is prominently distended with air and fluid. Dilated fluid-filled duodenum and small bowel. No discrete small bowel transition point. Favor postoperative ileus, although recurrent obstruction could have a similar appearance. 2. Left lower quadrant colostomy. Fluid/liquid stool within the cecum and ascending colon. 3. Dependent densities in the lower lobes, right greater than left. Findings may represent atelectasis or pneumonia, including aspiration. Adjacent nodular airspace disease in the right middle lobe is suspicious for pneumonia. 4. Small right and trace left pleural effusions. 5. Cholelithiasis. 6. Avascular necrosis of both humeral heads with subchondral collapse on the left. 7. Generalized body wall edema. Aortic Atherosclerosis (ICD10-I70.0). Electronically Signed   By: Narda Rutherford M.D.   On: 09/15/2023 17:30   DG Abd 2 Views Result Date: 09/13/2023 CLINICAL DATA:  Abdominal pain, bleeding EXAM: ABDOMEN - 2 VIEW COMPARISON:  09/10/2023 FINDINGS: Central line extends to the mid right atrium. The gastric tube is been removed. Skin staples over the right lower abdomen. Stomach and small bowel decompressed. Residual contrast in the nondistended colon. Left lower quadrant ostomy device. Left hip arthroplasty components partially visualized. IMPRESSION: Nonobstructive bowel gas pattern. Electronically Signed   By: Corlis Leak M.D.   On: 09/13/2023 19:25   DG Chest Port 1 View Result Date: 09/11/2023 CLINICAL DATA:  Encounter for central line placement EXAM: PORTABLE CHEST 1 VIEW COMPARISON:  Chest x-ray 09/07/2023.  Abdominal  x-ray 09/10/2023. FINDINGS: There is a new right IJ catheter with distal tip projecting over the right atrium. Right upper extremity PICC terminates in the distal SVC. Enteric tube extends below the diaphragm and has been pulled back  with distal tip now just beyond the gastroesophageal junction. Heart is mildly enlarged. There central pulmonary vascular congestion. There is left basilar atelectasis. There is no pleural effusion or pneumothorax. No acute fracture identified. IMPRESSION: 1. New right IJ catheter with distal tip projecting over the right atrium. No pneumothorax. 2. Enteric tube has been pulled back with distal tip now just beyond the gastroesophageal junction. Recommend advancing tube 10 cm. 3. Mild cardiomegaly with central pulmonary vascular congestion. 4. Left basilar atelectasis. Electronically Signed   By: Darliss Cheney M.D.   On: 09/11/2023 15:40   DG Abd 2 Views Result Date: 09/10/2023 CLINICAL DATA:  Abdominal pain.  Small-bowel obstruction. EXAM: ABDOMEN - 2 VIEW COMPARISON:  09/08/2023 and older exams.  CT, 09/06/2023. FINDINGS: There is dilated proximal small bowel projecting in the central abdomen, similar to the prior exams consistent with a partial small bowel obstruction. No free air. Nasogastric tube well positioned, tip in the distal stomach. Small amount of residual contrast noted in a mostly decompressed left colon. IMPRESSION: 1. Persistent partial small bowel obstruction. Electronically Signed   By: Amie Portland M.D.   On: 09/10/2023 09:07   DG Abd 1 View Result Date: 09/08/2023 CLINICAL DATA:  Nasogastric tube placement EXAM: ABDOMEN - 1 VIEW COMPARISON:  09/07/2023 FINDINGS: Enteric tube with tip at the distal stomach, near the pylorus. Hazy density at the lung bases where there is atelectasis by recent CT. High-density within stool in the upper abdomen, unchanged. IMPRESSION: Enteric tube with tip near the pylorus. Electronically Signed   By: Tiburcio Pea M.D.   On: 09/08/2023 04:31   DG Chest Port 1 View Result Date: 09/07/2023 CLINICAL DATA:  Confirm NG tube placement EXAM: PORTABLE CHEST 1 VIEW COMPARISON:  None Available. FINDINGS: NG tube enters the stomach, the tip not visualized on this  chest study. Right PICC line in place, unchanged. Cardiomegaly with vascular congestion. Platelike atelectasis at the right lung base. Patchy opacities in the left lower lung, similar to prior study. No pneumothorax. No acute bony abnormality. IMPRESSION: NG tube enters the stomach, the tip not visualized on this chest x-ray. Right base atelectasis. Continued patchy opacity at the left lung base. Cardiomegaly, vascular congestion. Electronically Signed   By: Charlett Nose M.D.   On: 09/07/2023 23:33   DG Abd 2 Views Result Date: 09/07/2023 CLINICAL DATA:  Small-bowel obstruction. EXAM: ABDOMEN - 2 VIEW COMPARISON:  Radiographs 09/05/2023.  CT 09/06/2023. FINDINGS: Tip of the enteric tube is in the right upper quadrant of the abdomen, likely in the distal stomach. Minimal improvement in multiple moderately dilated loops of proximal to mid small bowel consistent with ongoing small bowel obstruction. Chronic contrast within the decompressed descending colon distal to the loop transverse colostomy. No evidence of pneumoperitoneum. Atelectasis is present at both lung bases. IMPRESSION: Minimal improvement in small bowel obstruction. Enteric tube in place. Electronically Signed   By: Carey Bullocks M.D.   On: 09/07/2023 11:04   [place] EKG SITE RITE Result Date: 09/07/2023 If Site Rite image not attached, placement could not be confirmed due to current cardiac rhythm.  [place] EKG SITE RITE Result Date: 09/07/2023 If Site Rite image not attached, placement could not be confirmed due to current cardiac rhythm.   Microbiology No results found  for this or any previous visit (from the past 240 hours).  Lab Basic Metabolic Panel: Recent Labs  Lab 10/01/23 0431 10/01/23 1515 10/01/23 1814 10/02/23 0401 10/03/23 0445 10/04/23 0354 10/05/23 0331  NA 146*  --   --  146* 143 144 147*  K 5.2*   < > 4.6 4.0 5.2* 4.4 4.8  CL 113*  --   --  115* 103 109 109  CO2 24  --   --  26 26 25 27   GLUCOSE 147*  --   --  143*  157* 139* 152*  BUN 55*  --   --  47* 62* 82* 86*  CREATININE 1.26*  --   --  1.04 1.21 1.39* 1.36*  CALCIUM 8.1*  --   --  8.2* 8.2* 8.0* 8.3*  MG 2.4  --   --  2.3 2.4 2.5* 2.4  PHOS 5.8*  --   --  4.2 3.7 3.1 3.5   < > = values in this interval not displayed.   Liver Function Tests: Recent Labs  Lab 10/01/23 0431 10/02/23 0401 10/03/23 0445 10/04/23 0354 10/05/23 0331  ALBUMIN 2.2* 2.5* 2.4* 2.3* 2.2*   No results for input(s): "LIPASE", "AMYLASE" in the last 168 hours. No results for input(s): "AMMONIA" in the last 168 hours. CBC: Recent Labs  Lab 10/01/23 0431 10/02/23 0401 10/03/23 0445 10/04/23 0354 10/05/23 0331  WBC 12.3* 12.6* 14.6* 16.9* 16.4*  HGB 7.7* 8.2* 7.7* 7.8* 8.5*  HCT 27.1* 28.0* 26.1* 26.7* 29.3*  MCV 97.5 96.6 97.0 98.2 99.0  PLT 281 316 246 261 253   Cardiac Enzymes: No results for input(s): "CKTOTAL", "CKMB", "CKMBINDEX", "TROPONINI" in the last 168 hours. Sepsis Labs: Recent Labs  Lab 10/02/23 0401 10/03/23 0445 10/04/23 0354 10/05/23 0331  WBC 12.6* 14.6* 16.9* 16.4*    Procedures/Operations  See procedure notes for this admission     Webb Silversmith, DNP, CCRN, FNP-C, AGACNP-BC Acute Care & Family Nurse Practitioner  [facility]  See Amion for personal pager PCCM on call pager [phone] until 7 am

## 2023-11-03 NOTE — Progress Notes (Signed)
   10/22/2023 0000  Spiritual Encounters  Type of Visit Follow up  Care provided to: Family  Reason for visit Patient death  OnCall Visit Yes   Chaplain was called to provide comfort to family at patient's death.

## 2023-11-03 DEATH — deceased
